# Patient Record
Sex: Female | Born: 1945
Health system: Southern US, Community
[De-identification: ages and names within clinical notes are randomized; demographics above are authoritative.]

## PROBLEM LIST (undated history)

## (undated) DIAGNOSIS — K7290 Hepatic failure, unspecified without coma: Secondary | ICD-10-CM

## (undated) DIAGNOSIS — E785 Hyperlipidemia, unspecified: Secondary | ICD-10-CM

## (undated) DIAGNOSIS — M25519 Pain in unspecified shoulder: Secondary | ICD-10-CM

## (undated) DIAGNOSIS — I5032 Chronic diastolic (congestive) heart failure: Secondary | ICD-10-CM

## (undated) DIAGNOSIS — K7460 Unspecified cirrhosis of liver: Secondary | ICD-10-CM

## (undated) DIAGNOSIS — D485 Neoplasm of uncertain behavior of skin: Secondary | ICD-10-CM

## (undated) DIAGNOSIS — I10 Essential (primary) hypertension: Secondary | ICD-10-CM

## (undated) DIAGNOSIS — N3020 Other chronic cystitis without hematuria: Secondary | ICD-10-CM

## (undated) DIAGNOSIS — R040 Epistaxis: Secondary | ICD-10-CM

## (undated) DIAGNOSIS — K219 Gastro-esophageal reflux disease without esophagitis: Secondary | ICD-10-CM

## (undated) DIAGNOSIS — R600 Localized edema: Secondary | ICD-10-CM

## (undated) DIAGNOSIS — R252 Cramp and spasm: Secondary | ICD-10-CM

## (undated) DIAGNOSIS — Z86010 Personal history of colonic polyps: Secondary | ICD-10-CM

## (undated) DIAGNOSIS — Z974 Presence of external hearing-aid: Secondary | ICD-10-CM

## (undated) DIAGNOSIS — R0600 Dyspnea, unspecified: Secondary | ICD-10-CM

## (undated) DIAGNOSIS — I480 Paroxysmal atrial fibrillation: Secondary | ICD-10-CM

## (undated) DIAGNOSIS — M1990 Unspecified osteoarthritis, unspecified site: Secondary | ICD-10-CM

## (undated) DIAGNOSIS — M545 Low back pain, unspecified: Secondary | ICD-10-CM

## (undated) DIAGNOSIS — K7682 Hepatic encephalopathy: Secondary | ICD-10-CM

## (undated) DIAGNOSIS — I2510 Atherosclerotic heart disease of native coronary artery without angina pectoris: Secondary | ICD-10-CM

## (undated) DIAGNOSIS — Z87442 Personal history of urinary calculi: Secondary | ICD-10-CM

## (undated) DIAGNOSIS — T7840XA Allergy, unspecified, initial encounter: Secondary | ICD-10-CM

## (undated) DIAGNOSIS — R011 Cardiac murmur, unspecified: Secondary | ICD-10-CM

## (undated) DIAGNOSIS — E119 Type 2 diabetes mellitus without complications: Secondary | ICD-10-CM

## (undated) DIAGNOSIS — N181 Chronic kidney disease, stage 1: Secondary | ICD-10-CM

## (undated) DIAGNOSIS — H269 Unspecified cataract: Secondary | ICD-10-CM

## (undated) DIAGNOSIS — I509 Heart failure, unspecified: Secondary | ICD-10-CM

## (undated) DIAGNOSIS — Z794 Long term (current) use of insulin: Secondary | ICD-10-CM

## (undated) DIAGNOSIS — Z973 Presence of spectacles and contact lenses: Secondary | ICD-10-CM

## (undated) DIAGNOSIS — M5030 Other cervical disc degeneration, unspecified cervical region: Secondary | ICD-10-CM

## (undated) DIAGNOSIS — E8940 Asymptomatic postprocedural ovarian failure: Secondary | ICD-10-CM

## (undated) DIAGNOSIS — R7402 Elevation of levels of lactic acid dehydrogenase (LDH): Secondary | ICD-10-CM

## (undated) DIAGNOSIS — I8500 Esophageal varices without bleeding: Secondary | ICD-10-CM

## (undated) DIAGNOSIS — R740 Nonspecific elevation of levels of transaminase and lactic acid dehydrogenase [LDH]: Secondary | ICD-10-CM

## (undated) DIAGNOSIS — D696 Thrombocytopenia, unspecified: Secondary | ICD-10-CM

## (undated) DIAGNOSIS — I219 Acute myocardial infarction, unspecified: Secondary | ICD-10-CM

## (undated) DIAGNOSIS — N3941 Urge incontinence: Secondary | ICD-10-CM

## (undated) DIAGNOSIS — Z860101 Personal history of adenomatous and serrated colon polyps: Secondary | ICD-10-CM

## (undated) DIAGNOSIS — I81 Portal vein thrombosis: Secondary | ICD-10-CM

## (undated) DIAGNOSIS — R202 Paresthesia of skin: Secondary | ICD-10-CM

## (undated) DIAGNOSIS — R300 Dysuria: Secondary | ICD-10-CM

## (undated) DIAGNOSIS — K760 Fatty (change of) liver, not elsewhere classified: Secondary | ICD-10-CM

## (undated) DIAGNOSIS — M7062 Trochanteric bursitis, left hip: Secondary | ICD-10-CM

## (undated) DIAGNOSIS — I4891 Unspecified atrial fibrillation: Secondary | ICD-10-CM

## (undated) DIAGNOSIS — H9190 Unspecified hearing loss, unspecified ear: Secondary | ICD-10-CM

## (undated) DIAGNOSIS — R1310 Dysphagia, unspecified: Secondary | ICD-10-CM

## (undated) DIAGNOSIS — R7401 Elevation of levels of lactic acid dehydrogenase (LDH): Secondary | ICD-10-CM

## (undated) HISTORY — DX: Asymptomatic postprocedural ovarian failure: E89.40

## (undated) HISTORY — DX: Elevation of levels of lactic acid dehydrogenase (LDH): R74.02

## (undated) HISTORY — DX: Paresthesia of skin: R20.2

## (undated) HISTORY — DX: Trochanteric bursitis, left hip: M70.62

## (undated) HISTORY — DX: Low back pain, unspecified: M54.50

## (undated) HISTORY — PX: UPPER GASTROINTESTINAL ENDOSCOPY: SHX188

## (undated) HISTORY — DX: Allergy, unspecified, initial encounter: T78.40XA

## (undated) HISTORY — DX: Dysuria: R30.0

## (undated) HISTORY — DX: Unspecified osteoarthritis, unspecified site: M19.90

## (undated) HISTORY — DX: Chronic kidney disease, stage 1: N18.1

## (undated) HISTORY — DX: Gastro-esophageal reflux disease without esophagitis: K21.9

## (undated) HISTORY — DX: Nonspecific elevation of levels of transaminase and lactic acid dehydrogenase (ldh): R74.0

## (undated) HISTORY — PX: BREAST BIOPSY: SHX20

## (undated) HISTORY — DX: Dysphagia, unspecified: R13.10

## (undated) HISTORY — DX: Hyperlipidemia, unspecified: E78.5

## (undated) HISTORY — DX: Neoplasm of uncertain behavior of skin: D48.5

## (undated) HISTORY — DX: Unspecified cataract: H26.9

## (undated) HISTORY — DX: Cramp and spasm: R25.2

## (undated) HISTORY — DX: Elevation of levels of lactic acid dehydrogenase (LDH): R74.01

## (undated) HISTORY — DX: Pain in unspecified shoulder: M25.519

## (undated) HISTORY — DX: Low back pain: M54.5

## (undated) HISTORY — DX: Heart failure, unspecified: I50.9

## (undated) HISTORY — DX: Epistaxis: R04.0

## (undated) HISTORY — DX: Essential (primary) hypertension: I10

## (undated) HISTORY — DX: Unspecified cirrhosis of liver: K74.60

## (undated) HISTORY — DX: Type 2 diabetes mellitus without complications: E11.9

---

## 1980-05-09 HISTORY — PX: TUBAL LIGATION: SHX77

## 1995-05-10 HISTORY — PX: VAGINAL HYSTERECTOMY: SUR661

## 1997-06-26 ENCOUNTER — Other Ambulatory Visit: Admission: RE | Admit: 1997-06-26 | Discharge: 1997-06-26 | Payer: Self-pay | Admitting: *Deleted

## 1997-08-11 ENCOUNTER — Ambulatory Visit (HOSPITAL_COMMUNITY): Admission: RE | Admit: 1997-08-11 | Discharge: 1997-08-11 | Payer: Self-pay | Admitting: *Deleted

## 1998-06-26 ENCOUNTER — Other Ambulatory Visit: Admission: RE | Admit: 1998-06-26 | Discharge: 1998-06-26 | Payer: Self-pay | Admitting: *Deleted

## 1998-08-04 ENCOUNTER — Ambulatory Visit (HOSPITAL_COMMUNITY): Admission: RE | Admit: 1998-08-04 | Discharge: 1998-08-04 | Payer: Self-pay | Admitting: *Deleted

## 1998-08-04 ENCOUNTER — Encounter: Payer: Self-pay | Admitting: *Deleted

## 1999-03-19 ENCOUNTER — Ambulatory Visit (HOSPITAL_COMMUNITY): Admission: RE | Admit: 1999-03-19 | Discharge: 1999-03-19 | Payer: Self-pay | Admitting: *Deleted

## 1999-03-19 ENCOUNTER — Encounter: Payer: Self-pay | Admitting: *Deleted

## 1999-07-06 ENCOUNTER — Other Ambulatory Visit: Admission: RE | Admit: 1999-07-06 | Discharge: 1999-07-06 | Payer: Self-pay | Admitting: *Deleted

## 2000-08-21 ENCOUNTER — Encounter: Payer: Self-pay | Admitting: *Deleted

## 2000-08-21 ENCOUNTER — Ambulatory Visit (HOSPITAL_COMMUNITY): Admission: RE | Admit: 2000-08-21 | Discharge: 2000-08-21 | Payer: Self-pay | Admitting: *Deleted

## 2001-04-13 ENCOUNTER — Encounter (INDEPENDENT_AMBULATORY_CARE_PROVIDER_SITE_OTHER): Payer: Self-pay | Admitting: Specialist

## 2001-04-13 ENCOUNTER — Ambulatory Visit (HOSPITAL_COMMUNITY): Admission: RE | Admit: 2001-04-13 | Discharge: 2001-04-13 | Payer: Self-pay | Admitting: *Deleted

## 2001-04-13 ENCOUNTER — Encounter (INDEPENDENT_AMBULATORY_CARE_PROVIDER_SITE_OTHER): Payer: Self-pay | Admitting: *Deleted

## 2001-09-04 ENCOUNTER — Encounter: Payer: Self-pay | Admitting: *Deleted

## 2001-09-04 ENCOUNTER — Ambulatory Visit (HOSPITAL_COMMUNITY): Admission: RE | Admit: 2001-09-04 | Discharge: 2001-09-04 | Payer: Self-pay | Admitting: *Deleted

## 2002-08-08 ENCOUNTER — Other Ambulatory Visit: Admission: RE | Admit: 2002-08-08 | Discharge: 2002-08-08 | Payer: Self-pay | Admitting: *Deleted

## 2003-11-14 ENCOUNTER — Ambulatory Visit (HOSPITAL_COMMUNITY): Admission: RE | Admit: 2003-11-14 | Discharge: 2003-11-14 | Payer: Self-pay | Admitting: Internal Medicine

## 2004-06-18 ENCOUNTER — Encounter (INDEPENDENT_AMBULATORY_CARE_PROVIDER_SITE_OTHER): Payer: Self-pay | Admitting: *Deleted

## 2004-06-18 ENCOUNTER — Encounter (INDEPENDENT_AMBULATORY_CARE_PROVIDER_SITE_OTHER): Payer: Self-pay | Admitting: Specialist

## 2004-06-18 ENCOUNTER — Ambulatory Visit (HOSPITAL_COMMUNITY): Admission: RE | Admit: 2004-06-18 | Discharge: 2004-06-18 | Payer: Self-pay | Admitting: *Deleted

## 2005-11-06 ENCOUNTER — Emergency Department (HOSPITAL_COMMUNITY): Admission: EM | Admit: 2005-11-06 | Discharge: 2005-11-06 | Payer: Self-pay | Admitting: Family Medicine

## 2006-08-14 ENCOUNTER — Encounter: Admission: RE | Admit: 2006-08-14 | Discharge: 2006-08-14 | Payer: Self-pay | Admitting: Sports Medicine

## 2007-07-14 ENCOUNTER — Emergency Department (HOSPITAL_COMMUNITY): Admission: EM | Admit: 2007-07-14 | Discharge: 2007-07-14 | Payer: Self-pay | Admitting: Emergency Medicine

## 2007-09-13 ENCOUNTER — Ambulatory Visit (HOSPITAL_COMMUNITY): Admission: RE | Admit: 2007-09-13 | Discharge: 2007-09-13 | Payer: Self-pay | Admitting: *Deleted

## 2007-09-13 ENCOUNTER — Encounter (INDEPENDENT_AMBULATORY_CARE_PROVIDER_SITE_OTHER): Payer: Self-pay | Admitting: *Deleted

## 2008-05-09 HISTORY — PX: EXTRACORPOREAL SHOCK WAVE LITHOTRIPSY: SHX1557

## 2008-05-10 ENCOUNTER — Emergency Department (HOSPITAL_COMMUNITY): Admission: EM | Admit: 2008-05-10 | Discharge: 2008-05-10 | Payer: Self-pay | Admitting: Emergency Medicine

## 2008-05-29 ENCOUNTER — Ambulatory Visit (HOSPITAL_COMMUNITY): Admission: RE | Admit: 2008-05-29 | Discharge: 2008-05-29 | Payer: Self-pay | Admitting: Urology

## 2009-07-28 ENCOUNTER — Encounter (INDEPENDENT_AMBULATORY_CARE_PROVIDER_SITE_OTHER): Payer: Self-pay | Admitting: *Deleted

## 2009-09-01 ENCOUNTER — Ambulatory Visit: Payer: Self-pay | Admitting: Gastroenterology

## 2010-05-09 HISTORY — PX: COLONOSCOPY: SHX174

## 2010-06-08 NOTE — Procedures (Signed)
Summary: Colon   Colonoscopy  Procedure date:  04/13/2001  Findings:      Location:  [facility]  Patient:    [name], [name] Visit Number: [id number] MRN: [id number]          Service Type: END Location: ENDO Attending Physician:  Jim Desanctis Dictated by:   Jim Desanctis, M.D. Proc. Date: 04/13/01 Admit Date:  04/13/2001 Discharge Date: 04/13/2001                             Procedure Report  PROCEDURE: Colonoscopy.  INDICATIONS: Colon polyps.  ANESTHESIA: Demerol 20 mg, Versed 2 mg.  DESCRIPTION OF PROCEDURE: With the patient mildly sedated in the left lateral decubitus position, the Olympus videoscopic colonoscope was inserted into the rectum, passed under direct vision to the cecum identified by ileocecal valve and appendiceal orifice, both of which were photographed. From this point the coloscope was slowly withdrawn taking circumferential views of the entire colonic mucosa stopping at approximately 30 cm from the anal verge at which point a small polyp was seen, photographed and removed using hot biopsy forceps technique, setting of 20/20 ______ current. The endoscope was withdrawn through the rectum which appeared normal in direct and retroflexed vies. The endoscope was withdrawn. The patients vital signs and pulse oximeter remained stable. The patient tolerated the procedure well with no apparent complications.  FINDINGS: Small polyp at 30 cm, otherwise an unremarkable examination.  PLAN: Await biopsy report. The patient will call me for results and followup with me as an outpatient. See endoscopy note for further details. Dictated by:   Jim Desanctis, M.D. Attending Physician:  Jim Desanctis DD:  04/13/01 TD:  04/14/01 Job: [id number]

## 2010-06-08 NOTE — Assessment & Plan Note (Signed)
History of Present Illness Visit Type: Initial Consult Primary GI MD: Owens Loffler MD Primary Provider: Jeanmarie Hubert, MD Requesting Provider: Jeanmarie Hubert, MD Chief Complaint: Colon screening /hx of polyps History of Present Illness:      very pleasant 65 year old womanwho was a former patient of Dr. Jim Desanctis.  She has had several colonoscopies over the past 10-12 years. In 2006 Dr. Lajoyce Corners documented "two small polyps" were removed. These were adenomatous. He repeat colonoscopy 3 years later in May of 2009. During that colonoscopy you also removed to colon polyps. He did not get a size to either of them. One was removed with forceps and the other with cautery, snare. These were also adenomatous.  I see an in office note 2 months later that he recommended she have a repeat colonoscopy at 3 year interval. I do not see documentation of prep quality on his 2009 colonoscopy.  She has had no troubles with bowels.  No bleeding, no constipation.             Current Medications (verified): 1)  Vitamin D 400 Unit Caps (Cholecalciferol) .... 2000 Units Per Day 2)  Glucotrol Xl 10 Mg Xr24h-Tab (Glipizide) .[name] Kitchen.. 1 By Mouth Once Daily 3)  Onglyza 5 Mg Tabs (Saxagliptin Hcl) .[name] Kitchen.. 1 By Mouth Once Daily 4)  Mobic 7.5 Mg Tabs (Meloxicam) .[name] Kitchen.. 1 By Mouth Once Daily 5)  Omeprazole 20 Mg Cpdr (Omeprazole) .[name] Kitchen.. 1 By Mouth Once Daily 6)  Zinc Gluconate 50 Mg Tabs (Zinc Gluconate) .... 1/4 Tab Daily 7)  Trimethoprim 100 Mg Tabs (Trimethoprim) .[name] Kitchen.. 1 By Mouth Once Daily 8)  Metformin Hcl 1000 Mg Tabs (Metformin Hcl) .[name] Kitchen.. 1 By Mouth Two Times A Day 9)  Multivitamins  Tabs (Multiple Vitamin) .[name] Kitchen.. 1 By Mouth Once Daily 10)  Calcium-Carb 600 + D 600-125 Mg-Unit Tabs (Calcium-Vitamin D) .[name] Kitchen.. 1 By Mouth Two Times A Day 11)  Allergy Relief 4 Mg Tabs (Chlorpheniramine Maleate) .... Once Daily  Allergies (verified): No Known Drug Allergies  Past History:  Past Medical  History: Diabetes dysphagia Hypertension Hyperlipidemia Arthritis GERD Chronic kidney disease post ablative ovarian failure adenomatous colon polyps  Past Surgical History: partial hysterectomy 1998  Family History: no colon cancer  Social History: she is married, she has 6 children, she works as a housewife, she does not smoke cigarettes or drink alcohol.  Review of Systems       Pertinent positive and negative review of systems were noted in the above HPI and GI specific review of systems.  All other review of systems was otherwise negative.   Vital Signs:  Patient profile:   65 year old female Height:      63 inches Weight:      169.38 pounds BMI:     30.11 Pulse rate:   84 / minute Pulse rhythm:   regular BP sitting:   122 / 74  (left arm) Cuff size:   regular  Vitals Entered By: June McMurray Mora Deborra Medina) (September 01, 2009 1:30 PM)  Physical Exam  Additional Exam:  Constitutional: generally well appearing Psychiatric: alert and oriented times 3 Eyes: extraocular movements intact Mouth: oropharynx moist, no lesions Neck: supple, no lymphadenopathy Cardiovascular: heart regular rate and rythm Lungs: CTA bilaterally Abdomen: soft, non-tender, non-distended, no obvious ascites, no peritoneal signs, normal bowel sounds Extremities: no lower extremity edema bilaterally Skin: no lesions on visible extremities    Impression & Recommendations:  Problem # 1:  Personal history of precancerous colon polyps her most  recent colonoscopy was 2 years ago and at that time her gastroenterologist recommended she have a nether colonoscopy at 3 year interval. Current colorectal screening and polyp surveillance guidelines recommend colonoscopy usually 5 years following resection of 2 small, subcentimeter polyps. The size of the polyps were not documented on his report and neither was the prep quality and so I think to be safe we should simply follow his recommendations and repeat  colonoscopy at 3 year interval.  we will therefore set her up for repeat colonoscopy may, 2012.  Patient Instructions: 1)  Recall colonoscopy for May 2012. 2)  A copy of this information will be sent to Dr. Jeanmarie Hubert. 3)  The medication list was reviewed and reconciled.  All changed / newly prescribed medications were explained.  A complete medication list was provided to the patient / caregiver.  Appended Document: recall colon    Clinical Lists Changes  Observations: Added new observation of COLONNXTDUE: 09/2010 (09/01/2009 13:50)

## 2010-06-08 NOTE — Procedures (Signed)
Summary: EGD   EGD  Procedure date:  04/13/2001  Findings:      Location: [facility]                          [employer]  Patient:    [name], [name] Visit Number: [id number] MRN: [id number]          Service Type: END Location: ENDO Attending Physician:  Jim Desanctis Dictated by:   Jim Desanctis, M.D. Proc. Date: 04/13/01 Admit Date:  04/13/2001 Discharge Date: 04/13/2001                             Operative Report  PROCEDURE:  Upper endoscopy with dilation and biopsy.  INDICATIONS:  Dysphagia.  ANESTHESIA:  Demerol 50 mg, Versed 7 mg IV in divided doses.  DESCRIPTION OF PROCEDURE:  With the patient mildly sedated in the left lateral decubitus position, the Olympus videoscopic endoscope was inserted into the mouth and passed under direct vision through the esophagus. There appeared to be a stricture which was photographed.  We passed this easily into the stomach.  No Barretts esophagus tissue was seen.  There appeared to be food in the fundus.  We advanced to the antrum.  The duodenal bulb and second portion of the duodenum were photographed.  From this point, the endoscope was slowly withdrawn taking circumferential views of the entire duodenal mucosa until the endoscope was then pulled back into the stomach and placed in retroflexion to view the stomach from below and a hiatal hernia was seen and photographed.  The endoscope was then straightened and withdrawn after taking biopsies of the body of the stomach where there were some changes possibly of gastritis photographed and biopsied and after a guidewire was passed. Subsequently, a 16 Savary dilator was passed with some resistance.  I therefore elected to remove the guidewire with just one dilation.  The endoscope was reinserted.  No bleeding was seen.  No other abnormalities were noted.  The endoscope was withdrawn taking circumferential views of the remaining esophageal mucosa.  The patients  vital signs and pulse oximetry remained stable.  The patient tolerated the procedure well without apparent complications.  FINDINGS:  Stricture of the esophagus dilated to 16 Savary dilation.  There appears to be food in the stomach and some changes of gastritis in the body, biopsied.  Await biopsy.  The patient will call me for results and follow up with me as an outpatient.  Await clinical response.  Proceed to colonoscopy as planned. Dictated by:   Jim Desanctis, M.D. Attending Physician:  Jim Desanctis DD:  04/13/01 TD:  04/14/01 Job: [id number]

## 2010-06-08 NOTE — Letter (Signed)
Summary: New Patient letter  [facility]  [place], [place] [address]   Phone: [phone]  Fax: [phone]       07/28/2009 MRN: [id number]  [place], [place]  [address]  Dear Ms. Durene Fruits,  Welcome to the Gastroenterology Division at [facility].    You are scheduled to see Dr.  [name] on 09-01-09 at 1:30p.m. on the 3rd floor at [facility], [place] [employer].  We ask that you try to arrive at our office 15 minutes prior to your appointment time to allow for check-in.  We would like you to complete the enclosed self-administered evaluation form prior to your visit and bring it with you on the day of your appointment.  We will review it with you.  Also, please bring a complete list of all your medications or, if you prefer, bring the medication bottles and we will list them.  Please bring your insurance card so that we may make a copy of it.  If your insurance requires a referral to see a specialist, please bring your referral form from your primary care physician.  Co-payments are due at the time of your visit and may be paid by cash, check or credit card.     Your office visit will consist of a consult with your physician (includes a physical exam), any laboratory testing he/she may order, scheduling of any necessary diagnostic testing (e.g. x-ray, ultrasound, CT-scan), and scheduling of a procedure (e.g. Endoscopy, Colonoscopy) if required.  Please allow enough time on your schedule to allow for any/all of these possibilities.    If you cannot keep your appointment, please call [phone] to cancel or reschedule prior to your appointment date.  This allows [place] the opportunity to schedule an appointment for another patient in need of care.  If you do not cancel or reschedule by 5 p.m. the business day prior to your appointment date, you will be charged a $50.00 late cancellation/no-show fee.    Thank you for choosing  [facility] for your medical needs.  We appreciate the opportunity to care for you.  Please visit [place] at our website  to learn more about our practice.                     Sincerely,                                                             The Gastroenterology Division

## 2010-06-08 NOTE — Procedures (Signed)
Summary: Colon   Colonoscopy  Procedure date:  09/13/2007  Findings:      Location:  [facility].   NAME:  [name], [name]             ACCOUNT NO.:  [id number]      MEDICAL RECORD NO.:  [id number]          PATIENT TYPE:  AMB      LOCATION:  ENDO                         FACILITY:  [facility]      PHYSICIAN:  Waverly Ferrari, M.D.    DATE OF BIRTH:  1946/01/09      DATE OF PROCEDURE:   DATE OF DISCHARGE:                                  OPERATIVE REPORT      PROCEDURE:  Colonoscopy.      INDICATIONS:  Colon polyps.      ANESTHESIA:  Demerol 60 mg, Versed 7.5 mg.      PROCEDURE:  With the patient mildly sedated in the left lateral   decubitus position, the Pentax videoscopic colonoscope was inserted in   the rectum and passed under direct vision to the cecum identified by the   ileocecal valve and appendiceal orifice both of which were photographed.   From this point, the colonoscope was slowly withdrawn taking   circumferential views of the colonic mucosa stopping in the ascending   colon were two adjacent polyps were seen, the first was removed using   snare cautery technique setting of 20/150 blended current and was   retrieved through the suction into a tissue trap.  The second polyp was   removed using hot biopsy forceps technique same setting.  From this   point, the colonoscope was slowly withdrawn taking circumferential views   of the colonic mucosa stopping only then in the rectum which appeared   normal on direct and showed hemorrhoids on retroflexed view. The   endoscope was straightened and withdrawn. The patient's vital signs and   pulse oximeter remained stable.  The patient tolerated the procedure   well without apparent complications.      FINDINGS:  Internal hemorrhoids.  Polyps as described above.  An   occasional diverticulum seen in the sigmoid colon.      PLAN:  Await biopsy report.  The patient will call me for results and   follow-up with me as  needed as an outpatient.                  ______________________________   Waverly Ferrari, M.D.            GMO/MEDQ  D:  09/13/2007  T:  09/13/2007  Job:  [id number]      cc:   Viviann Spare. Nyoka Cowden, M.D.   Fax: [phone]

## 2010-06-08 NOTE — Procedures (Signed)
Summary: Colon   Colonoscopy  Procedure date:  06/18/2004  Findings:      Location:  [facility].   NAME:  [name], [name]             ACCOUNT NO.:  [id number]   MEDICAL RECORD NO.:  [id number]          PATIENT TYPE:  AMB   LOCATION:  ENDO                         FACILITY:  [facility]   PHYSICIAN:  Waverly Ferrari, M.D.    DATE OF BIRTH:  07-08-1945   DATE OF PROCEDURE:  06/18/2004  DATE OF DISCHARGE:                                 OPERATIVE REPORT   PROCEDURE:  Colonoscopy with biopsy.   INDICATIONS:  Colon polyps.   ANESTHESIA:  Demerol 70, Versed 8 mg.   PROCEDURE:  With the patient mildly sedated in the left lateral decubitus  position, the Olympus videoscopic colonoscope was inserted into the rectum  and passed under direct vision to the cecum identified by the base of the  cecum and the ileocecal valve both of which were photographed. From this  point, the colonoscope was slowly withdrawn taking circumferential views of  the colonic mucosa as we withdrew all the way to the rectum, stopping only  in the descending colon were two small polyps were seen, photographed and  removed using hot biopsy forceps technique on a setting of 20:20 blended  current. The endoscope was then as stated withdrawn. The patient's vital  signs and pulse oximeter remained stable. The patient tolerated procedure  well without apparent complication.   FINDINGS:  Two small polyps of descending colon otherwise an unremarkable  colonoscopic examination.   PLAN:  Await biopsy report. The patient will call me for results and follow-  up with me as an outpatient.      GMO/MEDQ  D:  06/18/2004  T:  06/18/2004  Job:  [id number]

## 2010-07-19 ENCOUNTER — Other Ambulatory Visit: Payer: Self-pay | Admitting: Internal Medicine

## 2010-07-19 DIAGNOSIS — Z1231 Encounter for screening mammogram for malignant neoplasm of breast: Secondary | ICD-10-CM

## 2010-07-28 ENCOUNTER — Ambulatory Visit (HOSPITAL_COMMUNITY)
Admission: RE | Admit: 2010-07-28 | Discharge: 2010-07-28 | Disposition: A | Discharge status: Home / Self Care | Payer: 59 | Source: Ambulatory Visit | Attending: Internal Medicine | Admitting: Internal Medicine

## 2010-07-28 DIAGNOSIS — Z1231 Encounter for screening mammogram for malignant neoplasm of breast: Secondary | ICD-10-CM | POA: Insufficient documentation

## 2010-08-23 LAB — URINALYSIS, ROUTINE W REFLEX MICROSCOPIC
Glucose, UA: 500 mg/dL — AB
Ketones, ur: NEGATIVE mg/dL
Nitrite: NEGATIVE
Specific Gravity, Urine: 1.021 (ref 1.005–1.030)
pH: 7 (ref 5.0–8.0)

## 2010-08-23 LAB — GLUCOSE, CAPILLARY
Glucose-Capillary: 239 mg/dL — ABNORMAL HIGH (ref 70–99)
Glucose-Capillary: 163 mg/dL — ABNORMAL HIGH (ref 70–99)

## 2010-08-23 LAB — BASIC METABOLIC PANEL
BUN: 18 mg/dL (ref 6–23)
Calcium: 9.5 mg/dL (ref 8.4–10.5)
Creatinine, Ser: 0.78 mg/dL (ref 0.4–1.2)
GFR calc non Af Amer: >60 mL/min (ref >60)
Glucose, Bld: 245 mg/dL — ABNORMAL HIGH (ref 70–99)
Potassium: 3.6 mEq/L (ref 3.5–5.1)

## 2010-08-23 LAB — URINE MICROSCOPIC-ADD ON

## 2010-09-21 NOTE — Op Note (Signed)
NAMETONDALAYA, PERREN NO.:  [id number]   MEDICAL RECORD NO.:  [id number]          PATIENT TYPE:  AMB   LOCATION:  ENDO                         FACILITY:  [facility]   PHYSICIAN:  Waverly Ferrari, M.D.    DATE OF BIRTH:  Sep 20, 1945   DATE OF PROCEDURE:  DATE OF DISCHARGE:                               OPERATIVE REPORT   PROCEDURE:  Colonoscopy.   INDICATIONS:  Colon polyps.   ANESTHESIA:  Demerol 60 mg, Versed 7.5 mg.   PROCEDURE:  With the patient mildly sedated in the left lateral  decubitus position, the Pentax videoscopic colonoscope was inserted in  the rectum and passed under direct vision to the cecum identified by the  ileocecal valve and appendiceal orifice both of which were photographed.  From this point, the colonoscope was slowly withdrawn taking  circumferential views of the colonic mucosa stopping in the ascending  colon were two adjacent polyps were seen, the first was removed using  snare cautery technique setting of 20/150 blended current and was  retrieved through the suction into a tissue trap.  The second polyp was  removed using hot biopsy forceps technique same setting.  From this  point, the colonoscope was slowly withdrawn taking circumferential views  of the colonic mucosa stopping only then in the rectum which appeared  normal on direct and showed hemorrhoids on retroflexed view. The  endoscope was straightened and withdrawn. The patient's vital signs and  pulse oximeter remained stable.  The patient tolerated the procedure  well without apparent complications.   FINDINGS:  Internal hemorrhoids.  Polyps as described above.  An  occasional diverticulum seen in the sigmoid colon.   PLAN:  Await biopsy report.  The patient will call me for results and  follow-up with me as needed as an outpatient.           ______________________________  Waverly Ferrari, M.D.     GMO/MEDQ  D:  09/13/2007  T:  09/13/2007  Job:  [id number]   cc:   Viviann Spare. Nyoka Cowden, M.D.  Fax: [phone]

## 2010-09-24 NOTE — Procedures (Signed)
[facility]  Patient:    [name], [name] Visit Number: [id number] MRN: [id number]          Service Type: END Location: ENDO Attending Physician:  Jim Desanctis Dictated by:   Jim Desanctis, M.D. Proc. Date: 04/13/01 Admit Date:  04/13/2001 Discharge Date: 04/13/2001                             Procedure Report  PROCEDURE: Colonoscopy.  INDICATIONS: Colon polyps.  ANESTHESIA: Demerol 20 mg, Versed 2 mg.  DESCRIPTION OF PROCEDURE: With the patient mildly sedated in the left lateral decubitus position, the Olympus videoscopic colonoscope was inserted into the rectum, passed under direct vision to the cecum identified by ileocecal valve and appendiceal orifice, both of which were photographed. From this point the coloscope was slowly withdrawn taking circumferential views of the entire colonic mucosa stopping at approximately 30 cm from the anal verge at which point a small polyp was seen, photographed and removed using hot biopsy forceps technique, setting of 20/20 ______ current. The endoscope was withdrawn through the rectum which appeared normal in direct and retroflexed vies. The endoscope was withdrawn. The patients vital signs and pulse oximeter remained stable. The patient tolerated the procedure well with no apparent complications.  FINDINGS: Small polyp at 30 cm, otherwise an unremarkable examination.  PLAN: Await biopsy report. The patient will call me for results and followup with me as an outpatient. See endoscopy note for further details. Dictated by:   Jim Desanctis, M.D. Attending Physician:  Jim Desanctis DD:  04/13/01 TD:  04/14/01 Job: [id number]

## 2010-09-24 NOTE — Op Note (Signed)
[name]  Patient:    [name], [name] Visit Number: [id number] MRN: [id number]          Service Type: END Location: ENDO Attending Physician:  Jim Desanctis Dictated by:   Jim Desanctis, M.D. Proc. Date: 04/13/01 Admit Date:  04/13/2001 Discharge Date: 04/13/2001                             Operative Report  PROCEDURE:  Upper endoscopy with dilation and biopsy.  INDICATIONS:  Dysphagia.  ANESTHESIA:  Demerol 50 mg, Versed 7 mg IV in divided doses.  DESCRIPTION OF PROCEDURE:  With the patient mildly sedated in the left lateral decubitus position, the Olympus videoscopic endoscope was inserted into the mouth and passed under direct vision through the esophagus. There appeared to be a stricture which was photographed.  We passed this easily into the stomach.  No Barretts esophagus tissue was seen.  There appeared to be food in the fundus.  We advanced to the antrum.  The duodenal bulb and second portion of the duodenum were photographed.  From this point, the endoscope was slowly withdrawn taking circumferential views of the entire duodenal mucosa until the endoscope was then pulled back into the stomach and placed in retroflexion to view the stomach from below and a hiatal hernia was seen and photographed.  The endoscope was then straightened and withdrawn after taking biopsies of the body of the stomach where there were some changes possibly of gastritis photographed and biopsied and after a guidewire was passed. Subsequently, a 16 Savary dilator was passed with some resistance.  I therefore elected to remove the guidewire with just one dilation.  The endoscope was reinserted.  No bleeding was seen.  No other abnormalities were noted.  The endoscope was withdrawn taking circumferential views of the remaining esophageal mucosa.  The patients vital signs and pulse oximetry remained stable.  The patient tolerated the procedure well without  apparent complications.  FINDINGS:  Stricture of the esophagus dilated to 16 Savary dilation.  There appears to be food in the stomach and some changes of gastritis in the body, biopsied.  Await biopsy.  The patient will call me for results and follow up with me as an outpatient.  Await clinical response.  Proceed to colonoscopy as planned. Dictated by:   Jim Desanctis, M.D. Attending Physician:  Jim Desanctis DD:  04/13/01 TD:  04/14/01 Job: [id number]

## 2010-09-24 NOTE — Op Note (Signed)
NAMEUNDREA, [name] NO.:  [id number]   MEDICAL RECORD NO.:  [id number]          PATIENT TYPE:  AMB   LOCATION:  ENDO                         FACILITY[facility]  [facility]   PHYSICIAN:  Waverly Ferrari, M.D.    DATE OF BIRTH:  1945-09-17   DATE OF PROCEDURE:  06/18/2004  DATE OF DISCHARGE:                                 OPERATIVE REPORT   PROCEDURE:  Colonoscopy with biopsy.   INDICATIONS:  Colon polyps.   ANESTHESIA:  Demerol 70, Versed 8 mg.   PROCEDURE:  With the patient mildly sedated in the left lateral decubitus  position, the Olympus videoscopic colonoscope was inserted into the rectum  and passed under direct vision to the cecum identified by the base of the  cecum and the ileocecal valve both of which were photographed. From this  point, the colonoscope was slowly withdrawn taking circumferential views of  the colonic mucosa as we withdrew all the way to the rectum, stopping only  in the descending colon were two small polyps were seen, photographed and  removed using hot biopsy forceps technique on a setting of 20:20 blended  current. The endoscope was then as stated withdrawn. The patient's vital  signs and pulse oximeter remained stable. The patient tolerated procedure  well without apparent complication.   FINDINGS:  Two small polyps of descending colon otherwise an unremarkable  colonoscopic examination.   PLAN:  Await biopsy report. The patient will call me for results and follow-  up with me as an outpatient.      GMO/MEDQ  D:  06/18/2004  T:  06/18/2004  Job:  [id number]

## 2010-11-03 ENCOUNTER — Encounter: Payer: Self-pay | Admitting: Gastroenterology

## 2010-11-16 ENCOUNTER — Encounter: Payer: Self-pay | Admitting: Gastroenterology

## 2010-12-08 ENCOUNTER — Ambulatory Visit (AMBULATORY_SURGERY_CENTER): Payer: 59 | Admitting: *Deleted

## 2010-12-08 ENCOUNTER — Encounter: Payer: Self-pay | Admitting: Gastroenterology

## 2010-12-08 VITALS — Ht 64.5 in | Wt 160.0 lb

## 2010-12-08 DIAGNOSIS — Z1211 Encounter for screening for malignant neoplasm of colon: Secondary | ICD-10-CM

## 2010-12-08 MED ORDER — PEG-KCL-NACL-NASULF-NA ASC-C 100 G PO SOLR: ORAL | Status: DC

## 2010-12-22 ENCOUNTER — Encounter: Payer: Self-pay | Admitting: Gastroenterology

## 2010-12-22 ENCOUNTER — Ambulatory Visit: Payer: 59 | Admitting: Gastroenterology

## 2010-12-22 VITALS — Temp 97.1°F | Ht 64.0 in | Wt 160.0 lb

## 2010-12-22 DIAGNOSIS — D126 Benign neoplasm of colon, unspecified: Secondary | ICD-10-CM

## 2010-12-22 DIAGNOSIS — Z1211 Encounter for screening for malignant neoplasm of colon: Secondary | ICD-10-CM

## 2010-12-22 MED ORDER — SODIUM CHLORIDE 0.9 % IV SOLN: 500.0000 mL | INTRAVENOUS | Status: DC

## 2010-12-22 NOTE — Patient Instructions (Signed)
DISCHARGE INSTRUCTIONS GIVEN WITH VERBAL UNDERSTANDING. HANDOUT ON POLYPS GIVEN. RESUME PREVIOUS MEDICATIONS.

## 2010-12-23 ENCOUNTER — Telehealth: Payer: Self-pay | Admitting: *Deleted

## 2010-12-23 NOTE — Telephone Encounter (Signed)

## 2011-01-31 LAB — URINALYSIS, ROUTINE W REFLEX MICROSCOPIC
Bilirubin Urine: NEGATIVE
Glucose, UA: 250 — AB
Hgb urine dipstick: NEGATIVE
Ketones, ur: NEGATIVE
Nitrite: NEGATIVE
Protein, ur: NEGATIVE
Specific Gravity, Urine: 1.019
Urobilinogen, UA: 1
pH: 5.5

## 2011-01-31 LAB — I-STAT 8, (EC8 V) (CONVERTED LAB)
Acid-Base Excess: 1
BUN: 15
Bicarbonate: 26.4 — ABNORMAL HIGH
Chloride: 104
Glucose, Bld: 214 — ABNORMAL HIGH
HCT: 44
Operator id: 151321
Potassium: 4.3
Sodium: 138
TCO2: 28
pCO2, Ven: 45.7
pH, Ven: 7.369 — ABNORMAL HIGH

## 2011-01-31 LAB — DIFFERENTIAL
Basophils Absolute: 0
Basophils Relative: 0
Eosinophils Absolute: 0.1
Eosinophils Relative: 1
Lymphocytes Relative: 15
Lymphs Abs: 1.4
Monocytes Absolute: 0.6
Monocytes Relative: 7
Neutro Abs: 6.9
Neutrophils Relative %: 77

## 2011-01-31 LAB — CBC
HCT: 39.3
Hemoglobin: 13.6
MCHC: 34.5
MCV: 89.7
Platelets: 158
RBC: 4.38
RDW: 12.8
WBC: 9

## 2011-01-31 LAB — URINE MICROSCOPIC-ADD ON

## 2011-01-31 LAB — POCT I-STAT CREATININE
Creatinine, Ser: 0.8
Operator id: 151321

## 2011-08-12 ENCOUNTER — Other Ambulatory Visit: Payer: Self-pay | Admitting: Internal Medicine

## 2011-08-12 DIAGNOSIS — Z1231 Encounter for screening mammogram for malignant neoplasm of breast: Secondary | ICD-10-CM

## 2011-09-12 ENCOUNTER — Ambulatory Visit (HOSPITAL_COMMUNITY)
Admission: RE | Admit: 2011-09-12 | Discharge: 2011-09-12 | Disposition: A | Discharge status: Home / Self Care | Payer: 59 | Source: Ambulatory Visit | Attending: Internal Medicine | Admitting: Internal Medicine

## 2011-09-12 DIAGNOSIS — Z1231 Encounter for screening mammogram for malignant neoplasm of breast: Secondary | ICD-10-CM | POA: Insufficient documentation

## 2011-12-18 ENCOUNTER — Emergency Department (INDEPENDENT_AMBULATORY_CARE_PROVIDER_SITE_OTHER)
Admission: EM | Admit: 2011-12-18 | Discharge: 2011-12-18 | Disposition: A | Discharge status: Home / Self Care | Payer: Medicare Other | Source: Home / Self Care

## 2011-12-18 ENCOUNTER — Encounter (HOSPITAL_COMMUNITY): Payer: Self-pay

## 2011-12-18 DIAGNOSIS — R300 Dysuria: Secondary | ICD-10-CM

## 2011-12-18 DIAGNOSIS — N390 Urinary tract infection, site not specified: Secondary | ICD-10-CM

## 2011-12-18 LAB — POCT URINALYSIS DIP (DEVICE)
Bilirubin Urine: NEGATIVE
Nitrite: POSITIVE — AB
Protein, ur: 100 mg/dL — AB
pH: 7 (ref 5.0–8.0)

## 2011-12-18 MED ORDER — PHENAZOPYRIDINE HCL 200 MG PO TABS: 200.0000 mg | ORAL_TABLET | Freq: Three times a day (TID) | ORAL | Status: AC

## 2011-12-18 MED ORDER — CIPROFLOXACIN HCL 500 MG PO TABS: 500.0000 mg | ORAL_TABLET | Freq: Two times a day (BID) | ORAL | Status: AC

## 2011-12-18 MED ORDER — PENICILLIN G BENZATHINE 1200000 UNIT/2ML IM SUSP
INTRAMUSCULAR | Status: AC
  Filled 2011-12-18: qty 2

## 2011-12-18 NOTE — ED Provider Notes (Signed)
History     CSN: [id number]  Arrival date & time 12/18/11  1346   None     Chief Complaint  Patient presents with  . Urinary Tract Infection    (Consider location/radiation/quality/duration/timing/severity/associated sxs/prior treatment) The history is provided by the patient.  [name] is a 66 y.o. female who complains of urinary frequency, urgency and dysuria x since yesterday, without flank pain, fever, chills, or abnormal vaginal discharge or bleeding.   Has been taking AZO for symptoms with no relief.  Has history of UTI, not frequent, last UTI 1 years ago.  Reports seen by urologist and prescribed medication to prevent UTI, predisposed to infection after sexual intercourse.  States she did take medication but no change in symptoms.  Denies known kidney disease or structural abnormalities. Past Medical History  Diagnosis Date  . Allergy   . Arthritis   . GERD (gastroesophageal reflux disease)   . Diabetes mellitus     Past Surgical History  Procedure Date  . Vaginal hysterectomy   . Colonoscopy   . Upper gastrointestinal endoscopy     History reviewed. No pertinent family history.  History  Substance Use Topics  . Smoking status: Never Smoker   . Smokeless tobacco: Never Used  . Alcohol Use: No    OB History    Grav Para Term Preterm Abortions TAB SAB Ect Mult Living                  Review of Systems  Constitutional: Negative.   Respiratory: Negative.   Cardiovascular: Negative.   Gastrointestinal: Positive for nausea and abdominal pain. Negative for vomiting, diarrhea and abdominal distention.  Genitourinary: Positive for dysuria, urgency, frequency and hematuria. Negative for flank pain, decreased urine volume, vaginal bleeding, vaginal discharge, difficulty urinating, vaginal pain and pelvic pain.    Allergies  Latex  Home Medications   Current Outpatient Rx  Name Route Sig Dispense Refill  . ASPIRIN 81 MG PO TABS Oral Take 81 mg by  mouth daily.      [name] Kitchen CALCIUM CARBONATE 600 MG PO TABS Oral Take 600 mg by mouth 2 (two) times daily with a meal.      . VITAMIN D 1000 UNITS PO TABS Oral Take 1,000 Units by mouth daily.      [name] Kitchen CINNAMON 500 MG PO TABS Oral Take 500 mg by mouth daily. Takes 6 a day     . CIPROFLOXACIN HCL 500 MG PO TABS Oral Take 1 tablet (500 mg total) by mouth every 12 (twelve) hours. 14 tablet 0  . VITAMIN B 12 PO Oral Take 1,000 mcg by mouth daily.      . OMEGA-3 FATTY ACIDS 1000 MG PO CAPS Oral Take 1 g by mouth daily.      [name] Kitchen GLIPIZIDE XL 10 MG PO TB24 Oral Take 10 mg by mouth 2 (two) times daily.     [name] Kitchen LORATADINE 10 MG PO TABS Oral Take 10 mg by mouth daily.      . MELOXICAM 7.5 MG PO TABS Oral Take 7.5 mg by mouth daily.     [name] Kitchen METFORMIN HCL 1000 MG PO TABS Oral Take 1,000 mg by mouth 2 (two) times daily.     . MULTI-VITAMIN/MINERALS PO TABS Oral Take 1 tablet by mouth daily.      [name] Kitchen OMEPRAZOLE 20 MG PO CPDR Oral Take 20 mg by mouth daily.     . ONGLYZA 5 MG PO TABS Oral Take 5 mg by mouth  daily.     [name] Kitchen PHENAZOPYRIDINE HCL 200 MG PO TABS Oral Take 1 tablet (200 mg total) by mouth 3 (three) times daily. 6 tablet 0    BP 136/83  Pulse 74  Temp 98.1 F (36.7 C) (Oral)  Resp 18  SpO2 95%  Physical Exam  Nursing note and vitals reviewed. Constitutional: She is oriented to person, place, and time. Vital signs are normal. She appears well-developed and well-nourished. She is active and cooperative.  HENT:  Head: Normocephalic.  Eyes: Conjunctivae are normal. Pupils are equal, round, and reactive to light. No scleral icterus.  Neck: Trachea normal. Neck supple.  Cardiovascular: Normal rate, regular rhythm, normal heart sounds and normal pulses.   Pulmonary/Chest: Effort normal and breath sounds normal.  Abdominal: Soft. Normal appearance and bowel sounds are normal. There is tenderness in the suprapubic area. There is no CVA tenderness.  Lymphadenopathy:       Right: No inguinal adenopathy present.        Left: No inguinal adenopathy present.  Neurological: She is alert and oriented to person, place, and time. No cranial nerve deficit or sensory deficit.  Skin: Skin is warm and dry.  Psychiatric: She has a normal mood and affect. Her speech is normal and behavior is normal. Judgment and thought content normal. Cognition and memory are normal.    ED Course  Procedures (including critical care time)  Labs Reviewed  POCT URINALYSIS DIP (DEVICE) - Abnormal; Notable for the following:    Hgb urine dipstick LARGE (*)     Protein, ur 100 (*)     Nitrite POSITIVE (*)     Leukocytes, UA MODERATE (*)  Biochemical Testing Only. Please order routine urinalysis from main lab if confirmatory testing is needed.   All other components within normal limits  URINE CULTURE   No results found.   1. UTI (lower urinary tract infection)   2. Dysuria       MDM  Await urine culture.  Cipro BID as prescribed.  Increase fluids.  Follow up with your urologist if symptoms are not resolved.         Awilda Metro, NP 12/18/11 1640

## 2011-12-18 NOTE — ED Notes (Signed)
Pt has urinary frequency, burning and hematuria for one day.

## 2011-12-20 LAB — URINE CULTURE: Colony Count: NO GROWTH

## 2011-12-22 NOTE — ED Provider Notes (Signed)
Medical screening examination/treatment/procedure(s) were performed by non-physician practitioner and as supervising physician I was immediately available for consultation/collaboration.  Cherly Beach MD   Cherly Beach, MD 12/22/11 2045

## 2012-02-23 DIAGNOSIS — I10 Essential (primary) hypertension: Secondary | ICD-10-CM | POA: Diagnosis not present

## 2012-02-23 DIAGNOSIS — IMO0001 Reserved for inherently not codable concepts without codable children: Secondary | ICD-10-CM | POA: Diagnosis not present

## 2012-02-28 DIAGNOSIS — IMO0001 Reserved for inherently not codable concepts without codable children: Secondary | ICD-10-CM | POA: Diagnosis not present

## 2012-02-28 DIAGNOSIS — Z209 Contact with and (suspected) exposure to unspecified communicable disease: Secondary | ICD-10-CM | POA: Diagnosis not present

## 2012-02-28 DIAGNOSIS — E785 Hyperlipidemia, unspecified: Secondary | ICD-10-CM | POA: Diagnosis not present

## 2012-03-14 DIAGNOSIS — H10019 Acute follicular conjunctivitis, unspecified eye: Secondary | ICD-10-CM | POA: Diagnosis not present

## 2012-03-28 DIAGNOSIS — H10019 Acute follicular conjunctivitis, unspecified eye: Secondary | ICD-10-CM | POA: Diagnosis not present

## 2012-04-13 DIAGNOSIS — N390 Urinary tract infection, site not specified: Secondary | ICD-10-CM | POA: Diagnosis not present

## 2012-05-05 ENCOUNTER — Ambulatory Visit (INDEPENDENT_AMBULATORY_CARE_PROVIDER_SITE_OTHER): Payer: Medicare Other | Admitting: Emergency Medicine

## 2012-05-05 VITALS — BP 136/71 | HR 75 | Temp 97.9°F | Resp 16 | Ht 63.5 in | Wt 165.0 lb

## 2012-05-05 DIAGNOSIS — N390 Urinary tract infection, site not specified: Secondary | ICD-10-CM

## 2012-05-05 DIAGNOSIS — N3000 Acute cystitis without hematuria: Secondary | ICD-10-CM

## 2012-05-05 DIAGNOSIS — R300 Dysuria: Secondary | ICD-10-CM

## 2012-05-05 LAB — POCT UA - MICROSCOPIC ONLY
Bacteria, U Microscopic: NEGATIVE
Casts, Ur, LPF, POC: NEGATIVE
Crystals, Ur, HPF, POC: NEGATIVE
Epithelial cells, urine per micros: NEGATIVE
Mucus, UA: NEGATIVE
Yeast, UA: NEGATIVE

## 2012-05-05 LAB — POCT URINALYSIS DIPSTICK
Bilirubin, UA: NEGATIVE
Glucose, UA: NEGATIVE
Ketones, UA: NEGATIVE
Nitrite, UA: NEGATIVE
Protein, UA: NEGATIVE
Spec Grav, UA: 1.015
Urobilinogen, UA: 0.2
pH, UA: 7

## 2012-05-05 MED ORDER — NITROFURANTOIN MONOHYD MACRO 100 MG PO CAPS: 100.0000 mg | ORAL_CAPSULE | Freq: Two times a day (BID) | ORAL | Status: DC

## 2012-05-05 MED ORDER — PHENAZOPYRIDINE HCL 200 MG PO TABS: 200.0000 mg | ORAL_TABLET | Freq: Three times a day (TID) | ORAL | Status: AC | PRN

## 2012-05-05 NOTE — Patient Instructions (Signed)
Urinary Tract Infection Urinary tract infections (UTIs) can develop anywhere along your urinary tract. Your urinary tract is your body's drainage system for removing wastes and extra water. Your urinary tract includes two kidneys, two ureters, a bladder, and a urethra. Your kidneys are a pair of bean-shaped organs. Each kidney is about the size of your fist. They are located below your ribs, one on each side of your spine. CAUSES Infections are caused by microbes, which are microscopic organisms, including fungi, viruses, and bacteria. These organisms are so small that they can only be seen through a microscope. Bacteria are the microbes that most commonly cause UTIs. SYMPTOMS  Symptoms of UTIs may vary by age and gender of the patient and by the location of the infection. Symptoms in young women typically include a frequent and intense urge to urinate and a painful, burning feeling in the bladder or urethra during urination. Older women and men are more likely to be tired, shaky, and weak and have muscle aches and abdominal pain. A fever may mean the infection is in your kidneys. Other symptoms of a kidney infection include pain in your back or sides below the ribs, nausea, and vomiting. DIAGNOSIS To diagnose a UTI, your caregiver will ask you about your symptoms. Your caregiver also will ask to provide a urine sample. The urine sample will be tested for bacteria and white blood cells. White blood cells are made by your body to help fight infection. TREATMENT  Typically, UTIs can be treated with medication. Because most UTIs are caused by a bacterial infection, they usually can be treated with the use of antibiotics. The choice of antibiotic and length of treatment depend on your symptoms and the type of bacteria causing your infection. HOME CARE INSTRUCTIONS  If you were prescribed antibiotics, take them exactly as your caregiver instructs you. Finish the medication even if you feel better after you  have only taken some of the medication.  Drink enough water and fluids to keep your urine clear or pale yellow.  Avoid caffeine, tea, and carbonated beverages. They tend to irritate your bladder.  Empty your bladder often. Avoid holding urine for long periods of time.  Empty your bladder before and after sexual intercourse.  After a bowel movement, women should cleanse from front to back. Use each tissue only once. SEEK MEDICAL CARE IF:   You have back pain.  You develop a fever.  Your symptoms do not begin to resolve within 3 days. SEEK IMMEDIATE MEDICAL CARE IF:   You have severe back pain or lower abdominal pain.  You develop chills.  You have nausea or vomiting.  You have continued burning or discomfort with urination. MAKE SURE YOU:   Understand these instructions.  Will watch your condition.  Will get help right away if you are not doing well or get worse. Document Released: 02/02/2005 Document Revised: 10/25/2011 Document Reviewed: 06/03/2011 [facility] Patient Information 2013 [place].

## 2012-05-05 NOTE — Progress Notes (Signed)
[facility] [address] [place] [address] [phone]  Date:  05/05/2012   Name:  [name]   DOB:  July 16, 1945   MRN:  [id number]  PCP:  Estill Dooms, MD    Chief Complaint: Urinary Tract Infection   History of Present Illness:  [name] is a 66 y.o. very pleasant female patient who presents with the following:  Treated for UTI with cipro for three days 3 weeks ago.  Developed symptoms again Thursday this week. Has dysuria, urgency and frequency.  Has blood in urine.  No nausea or vomiting, fever or chills, or back pain.  No GYN symptoms.  No abdominal pain.  Recurrent cystitis all her adult life.  There is no problem list on file for this patient.   Past Medical History  Diagnosis Date  . Allergy   . Arthritis   . GERD (gastroesophageal reflux disease)   . Diabetes mellitus     Past Surgical History  Procedure Date  . Vaginal hysterectomy   . Colonoscopy   . Upper gastrointestinal endoscopy     History  Substance Use Topics  . Smoking status: Never Smoker   . Smokeless tobacco: Never Used  . Alcohol Use: No    Family History  Problem Relation Age of Onset  . Cancer Father   . Arthritis Sister   . Arthritis Brother   . Heart disease Maternal Grandmother   . Heart disease Maternal Grandfather   . Heart disease Paternal Grandmother   . Heart disease Paternal Grandfather     Allergies  Allergen Reactions  . Latex Dermatitis    Medication list has been reviewed and updated.  Current Outpatient Prescriptions on File Prior to Visit  Medication Sig Dispense Refill  . aspirin 81 MG tablet Take 81 mg by mouth daily.        . calcium carbonate (OS-CAL) 600 MG TABS Take 600 mg by mouth 2 (two) times daily with a meal.        . Cinnamon 500 MG TABS Take 500 mg by mouth daily. Takes 6 a day       . Cyanocobalamin (VITAMIN B 12 PO) Take 1,000 mcg by mouth daily.        [name] Kitchen GLIPIZIDE XL 10 MG 24 hr tablet Take 10 mg by  mouth 2 (two) times daily.       [name] Kitchen loratadine (CLARITIN) 10 MG tablet Take 10 mg by mouth daily.        . meloxicam (MOBIC) 7.5 MG tablet Take 7.5 mg by mouth daily.       . metFORMIN (GLUCOPHAGE) 1000 MG tablet Take 1,000 mg by mouth 2 (two) times daily.       . Multiple Vitamins-Minerals (MULTIVITAMIN WITH MINERALS) tablet Take 1 tablet by mouth daily.        [name] Kitchen omeprazole (PRILOSEC) 20 MG capsule Take 20 mg by mouth daily.       . cholecalciferol (VITAMIN D) 1000 UNITS tablet Take 1,000 Units by mouth daily.        . fish oil-omega-3 fatty acids 1000 MG capsule Take 1 g by mouth daily.        . ONGLYZA 5 MG TABS tablet Take 5 mg by mouth daily.         Review of Systems:  As per HPI, otherwise negative.    Physical Examination: Filed Vitals:   05/05/12 1431  BP: 136/71  Pulse: 75  Temp:  97.9 F (36.6 C)  Resp: 16   Filed Vitals:   05/05/12 1431  Height: 5' 3.5" (1.613 m)  Weight: 165 lb (74.844 kg)   Body mass index is 28.77 kg/(m^2). Ideal Body Weight: Weight in (lb) to have BMI = 25: 143.1    GEN: WDWN, NAD, Non-toxic, Alert & Oriented x 3 HEENT: Atraumatic, Normocephalic.  Ears and Nose: No external deformity. EXTR: No clubbing/cyanosis/edema NEURO: Normal gait.  PSYCH: Normally interactive. Conversant. Not depressed or anxious appearing.  Calm demeanor.  ABDOMEN  Soft, not tender. No masses.  No cva tenderness  Assessment and Plan: Acute hemorrhagic cystitis. macrobid Pyridium Follow up as needed  Roselee Culver, MD  Results for orders placed in visit on 05/05/12  POCT URINALYSIS DIPSTICK      Component Value Range   Color, UA yellow     Clarity, UA cloudy     Glucose, UA neg     Bilirubin, UA neg     Ketones, UA neg     Spec Grav, UA 1.015     Blood, UA large     pH, UA 7.0     Protein, UA neg     Urobilinogen, UA 0.2     Nitrite, UA neg     Leukocytes, UA large (3+)    POCT UA - MICROSCOPIC ONLY      Component Value Range   WBC, Ur, HPF,  POC tntc     RBC, urine, microscopic tntc     Bacteria, U Microscopic neg     Mucus, UA neg     Epithelial cells, urine per micros neg     Crystals, Ur, HPF, POC neg     Casts, Ur, LPF, POC neg     Yeast, UA neg

## 2012-05-06 LAB — URINE CULTURE

## 2012-05-29 DIAGNOSIS — N3020 Other chronic cystitis without hematuria: Secondary | ICD-10-CM | POA: Diagnosis not present

## 2012-06-08 DIAGNOSIS — IMO0001 Reserved for inherently not codable concepts without codable children: Secondary | ICD-10-CM | POA: Diagnosis not present

## 2012-06-08 DIAGNOSIS — N181 Chronic kidney disease, stage 1: Secondary | ICD-10-CM | POA: Diagnosis not present

## 2012-06-08 DIAGNOSIS — I10 Essential (primary) hypertension: Secondary | ICD-10-CM | POA: Diagnosis not present

## 2012-06-08 DIAGNOSIS — E785 Hyperlipidemia, unspecified: Secondary | ICD-10-CM | POA: Diagnosis not present

## 2012-06-12 DIAGNOSIS — I10 Essential (primary) hypertension: Secondary | ICD-10-CM | POA: Diagnosis not present

## 2012-06-12 DIAGNOSIS — E785 Hyperlipidemia, unspecified: Secondary | ICD-10-CM | POA: Diagnosis not present

## 2012-06-12 DIAGNOSIS — IMO0001 Reserved for inherently not codable concepts without codable children: Secondary | ICD-10-CM | POA: Diagnosis not present

## 2012-07-11 DIAGNOSIS — H04129 Dry eye syndrome of unspecified lacrimal gland: Secondary | ICD-10-CM | POA: Diagnosis not present

## 2012-07-11 DIAGNOSIS — E119 Type 2 diabetes mellitus without complications: Secondary | ICD-10-CM | POA: Diagnosis not present

## 2012-07-25 ENCOUNTER — Telehealth: Payer: Self-pay | Admitting: *Deleted

## 2012-07-25 NOTE — Telephone Encounter (Signed)
[facility] [id number]  [phone] Phone  [phone] Fax   Lantus Solostar Pen  Inject 40 units at bedtime to control diabetes.   [phone] patient ID [id number]  Approved From 07/19/2012 to 05-08-2013

## 2012-07-27 ENCOUNTER — Other Ambulatory Visit: Payer: Self-pay | Admitting: *Deleted

## 2012-07-27 DIAGNOSIS — IMO0001 Reserved for inherently not codable concepts without codable children: Secondary | ICD-10-CM

## 2012-07-27 DIAGNOSIS — E785 Hyperlipidemia, unspecified: Secondary | ICD-10-CM

## 2012-07-27 DIAGNOSIS — I10 Essential (primary) hypertension: Secondary | ICD-10-CM

## 2012-07-31 NOTE — Telephone Encounter (Signed)
error 

## 2012-09-07 ENCOUNTER — Other Ambulatory Visit: Payer: Medicare Other

## 2012-09-07 DIAGNOSIS — E785 Hyperlipidemia, unspecified: Secondary | ICD-10-CM

## 2012-09-07 DIAGNOSIS — IMO0001 Reserved for inherently not codable concepts without codable children: Secondary | ICD-10-CM

## 2012-09-07 DIAGNOSIS — I10 Essential (primary) hypertension: Secondary | ICD-10-CM

## 2012-09-08 LAB — BASIC METABOLIC PANEL
CO2: 24 mmol/L (ref 19–28)
Calcium: 9.4 mg/dL (ref 8.6–10.2)
Chloride: 102 mmol/L (ref 97–108)
Glucose: 146 mg/dL — ABNORMAL HIGH (ref 65–99)
Potassium: 4.1 mmol/L (ref 3.5–5.2)

## 2012-09-08 LAB — HEMOGLOBIN A1C: Hgb A1c MFr Bld: 8.5 % — ABNORMAL HIGH (ref 4.8–5.6)

## 2012-09-11 ENCOUNTER — Encounter: Payer: Self-pay | Admitting: Internal Medicine

## 2012-09-11 ENCOUNTER — Encounter: Payer: Self-pay | Admitting: *Deleted

## 2012-09-11 ENCOUNTER — Ambulatory Visit (INDEPENDENT_AMBULATORY_CARE_PROVIDER_SITE_OTHER): Payer: Medicare Other | Admitting: Internal Medicine

## 2012-09-11 VITALS — BP 134/62 | HR 80 | Temp 98.0°F | Resp 16 | Ht 63.0 in | Wt 160.8 lb

## 2012-09-11 DIAGNOSIS — I10 Essential (primary) hypertension: Secondary | ICD-10-CM | POA: Diagnosis not present

## 2012-09-11 DIAGNOSIS — K219 Gastro-esophageal reflux disease without esophagitis: Secondary | ICD-10-CM

## 2012-09-11 DIAGNOSIS — IMO0001 Reserved for inherently not codable concepts without codable children: Secondary | ICD-10-CM

## 2012-09-11 DIAGNOSIS — E785 Hyperlipidemia, unspecified: Secondary | ICD-10-CM

## 2012-09-11 NOTE — Progress Notes (Signed)
Subjective:    Patient ID: [name], female    DOB: June 04, 1945, 67 y.o.   MRN: [id number]  HPI Patient has done fairly well since her last visit. Blood pressure is under good control. Reflux is under good control. Diabetic control has not changed. She has not been successful in losing weight.   Review of Systems Review of Systems - History obtained from the patient General ROS: negative Psychological ROS: negative Ophthalmic ROS: positive for - uses glasses ENT ROS: positive for - hearing change Allergy and Immunology ROS: negative Hematological and Lymphatic ROS: negative Endocrine ROS: positive for - diabetes Breast ROS: negative Respiratory ROS: no cough, shortness of breath, or wheezing Cardiovascular ROS: no chest pain or dyspnea on exertion Gastrointestinal ROS: no abdominal pain, change in bowel habits, or black or bloody stools Genito-Urinary ROS: no dysuria, trouble voiding, or hematuria Musculoskeletal ROS: positive for - joint pain Neurological ROS: no TIA or stroke symptoms Dermatological ROS: negative    Objective:   Physical Exam  BP 134/62  Pulse 80  Temp(Src) 98 F (36.7 C)  Resp 16  Ht 5' 3"  (1.6 m)  Wt 160 lb 12.8 oz (72.938 kg)  BMI 28.49 kg/m2  General Appearance:    Alert, cooperative, no distress, appears stated age.moderately overweight  Head:    Normocephalic, without obvious abnormality, atraumatic  Eyes:    PERRL, conjunctiva/corneas clear, EOM's intact, fundi    benign, both eyes  Ears:    Normal TM's and external ear canals, both ears  Nose:   Nares normal, septum midline, mucosa normal, no drainage    or sinus tenderness  Throat:   Lips, mucosa, and tongue normal; teeth and gums normal  Neck:   Supple, symmetrical, trachea midline, no adenopathy;    thyroid:  no enlargement/tenderness/nodules; no carotid   bruit or JVD  Back:     Symmetric, no curvature, ROM normal, no CVA tenderness  Lungs:     Clear to auscultation  bilaterally, respirations unlabored  Chest Wall:    No tenderness or deformity   Heart:    Regular rate and rhythm, S1 and S2 normal, no murmur, rub   or gallop  Abdomen:     Soft, non-tender, bowel sounds active all four quadrants,    no masses, no organomegaly  Pulses:   2+ and symmetric all extremities  Skin:   Skin color, texture, turgor normal, no rashes or lesions  Lymph nodes:   Cervical, supraclavicular, and axillary nodes normal  Neurologic:   CNII-XII intact, normal strength, sensation and reflexes    throughout     Lab reports 8/10/212 BMP: Glucose 142, BUN 13, Creatinine 0.81 HA1C: 8.1 Lipid: Cholesterol 181, Triglycerides 112, HDL 50, VLDL 22, LDL 109 03/18/2011 BMP: glucose 208, BUN 15, Creatinine 0.80 HgbA1c 8.8 Lipid: cholesterol 196, triglycerides 99, HDL 56, LDL 120 06/24/2011 BMP Glucose 165 Bun 17 Creatine 0.69  HA1C 8.2  Lipid Panel Cholesterol 203 Triglycerides 89 Hdl 59 Ldl 126  10/28/2011 BMP Glucose 156 Bun 14 Creatinine 0.67  HA1C 9.5  Lipid Panel Cholesterol 208 Triglycerides 87 Hdl 61 Ldl 130  02/23/2012 BMP Glucose 162 Bun 12 Creatinine 0.70  HA1C 9.3  06/08/2012  BMP: glucose 143, BUN 14, Creatinine 0.66 A1C: 8.5 Lipid: Cholesterol 195, Triglycerides 76, HDL 55, LDL 125 09/07/12 A1c 8.5     CMP: glu 146   Assessment & Plan:  1. Type II or unspecified type diabetes mellitus without mention of complication, uncontrolled : Controlled. I think  she could do better with her diet. - Hemoglobin A1c; Future  2. Unspecified essential hypertension controlled - CMP; Future  3. Other and unspecified hyperlipidemia controlled - Lipid panel; Future  4. GERD (gastroesophageal reflux disease) Nearly symptom-free

## 2012-09-11 NOTE — Patient Instructions (Signed)
Continue current medications. 

## 2012-09-15 ENCOUNTER — Encounter: Payer: Self-pay | Admitting: Internal Medicine

## 2012-09-15 DIAGNOSIS — I10 Essential (primary) hypertension: Secondary | ICD-10-CM | POA: Insufficient documentation

## 2012-09-15 DIAGNOSIS — E119 Type 2 diabetes mellitus without complications: Secondary | ICD-10-CM | POA: Insufficient documentation

## 2012-09-15 DIAGNOSIS — K219 Gastro-esophageal reflux disease without esophagitis: Secondary | ICD-10-CM | POA: Insufficient documentation

## 2012-09-15 DIAGNOSIS — E785 Hyperlipidemia, unspecified: Secondary | ICD-10-CM | POA: Insufficient documentation

## 2012-11-20 ENCOUNTER — Other Ambulatory Visit: Payer: Self-pay | Admitting: Internal Medicine

## 2012-11-24 DIAGNOSIS — S46119A Strain of muscle, fascia and tendon of long head of biceps, unspecified arm, initial encounter: Secondary | ICD-10-CM | POA: Insufficient documentation

## 2012-11-26 DIAGNOSIS — M66329 Spontaneous rupture of flexor tendons, unspecified upper arm: Secondary | ICD-10-CM | POA: Diagnosis not present

## 2012-12-14 ENCOUNTER — Other Ambulatory Visit: Payer: Medicare Other

## 2012-12-14 DIAGNOSIS — I10 Essential (primary) hypertension: Secondary | ICD-10-CM | POA: Diagnosis not present

## 2012-12-14 DIAGNOSIS — IMO0001 Reserved for inherently not codable concepts without codable children: Secondary | ICD-10-CM

## 2012-12-14 DIAGNOSIS — E785 Hyperlipidemia, unspecified: Secondary | ICD-10-CM

## 2012-12-15 LAB — COMPREHENSIVE METABOLIC PANEL
ALT: 50 IU/L — ABNORMAL HIGH (ref 0–32)
Albumin/Globulin Ratio: 1.1 (ref 1.1–2.5)
BUN/Creatinine Ratio: 18 (ref 11–26)
GFR calc Af Amer: 99 mL/min/{1.73_m2} (ref >59)
GFR calc non Af Amer: 86 mL/min/{1.73_m2} (ref >59)
Potassium: 4 mmol/L (ref 3.5–5.2)
Sodium: 139 mmol/L (ref 134–144)
Total Bilirubin: 0.5 mg/dL (ref 0.0–1.2)
Total Protein: 7.3 g/dL (ref 6.0–8.5)

## 2012-12-15 LAB — LIPID PANEL: VLDL Cholesterol Cal: 14 mg/dL (ref 5–40)

## 2012-12-18 ENCOUNTER — Encounter: Payer: Self-pay | Admitting: Internal Medicine

## 2012-12-18 ENCOUNTER — Ambulatory Visit (INDEPENDENT_AMBULATORY_CARE_PROVIDER_SITE_OTHER): Payer: Medicare Other | Admitting: Internal Medicine

## 2012-12-18 VITALS — BP 142/84 | HR 60 | Temp 97.4°F | Resp 16 | Ht 63.0 in | Wt 162.6 lb

## 2012-12-18 DIAGNOSIS — IMO0001 Reserved for inherently not codable concepts without codable children: Secondary | ICD-10-CM

## 2012-12-18 DIAGNOSIS — K219 Gastro-esophageal reflux disease without esophagitis: Secondary | ICD-10-CM

## 2012-12-18 DIAGNOSIS — I10 Essential (primary) hypertension: Secondary | ICD-10-CM

## 2012-12-18 DIAGNOSIS — E785 Hyperlipidemia, unspecified: Secondary | ICD-10-CM | POA: Diagnosis not present

## 2012-12-18 DIAGNOSIS — S46211A Strain of muscle, fascia and tendon of other parts of biceps, right arm, initial encounter: Secondary | ICD-10-CM

## 2012-12-18 DIAGNOSIS — S43499A Other sprain of unspecified shoulder joint, initial encounter: Secondary | ICD-10-CM

## 2012-12-18 DIAGNOSIS — S46819A Strain of other muscles, fascia and tendons at shoulder and upper arm level, unspecified arm, initial encounter: Secondary | ICD-10-CM

## 2012-12-18 NOTE — Patient Instructions (Signed)
Continue current medications. 

## 2012-12-18 NOTE — Progress Notes (Signed)
. Subjective:    Patient ID: [name], female    DOB: [birth year], 67 y.o.   MRN: [id number]  HPI  Other and unspecified hyperlipidemia: stable  GERD (gastroesophageal reflux disease): asymptomatic  Type II or unspecified type diabetes mellitus without mention of complication, uncontrolled: unchanged. A1c still high at 8.3  Unspecified essential hypertension: stable and controlled  Biceps tendon rupture, proximal, right, initial encounter: Tore atendon in the right shoulder on July 19,2014. A week after the initial injury, she had rupture of the long head of the biceps.   Review of Systems  Constitutional:       Gained 2 pounds since last office visit in May 2014  HENT: Positive for hearing loss.   Eyes: Negative.   Respiratory: Negative.   Cardiovascular: Negative for chest pain, palpitations and leg swelling.  Gastrointestinal: Negative.   Endocrine:       Diabetic  Genitourinary: Negative.   Musculoskeletal: Positive for myalgias, back pain and arthralgias. Negative for joint swelling and gait problem.  Skin: Negative.   Neurological: Negative.   Hematological: Negative.   Psychiatric/Behavioral: Negative.        Objective:BP 142/84  Pulse 60  Temp(Src) 97.4 F (36.3 C) (Oral)  Resp 16  Ht 5' 3"  (1.6 m)  Wt 162 lb 9.6 oz (73.755 kg)  BMI 28.81 kg/m2  SpO2 99%    Physical Exam  Constitutional: She is oriented to person, place, and time. She appears well-developed and well-nourished. No distress.  HENT:  Head: Normocephalic and atraumatic.  Nose: Nose normal.  Mild bilateral hearing loss.  Eyes: Conjunctivae and EOM are normal. Pupils are equal, round, and reactive to light.  Neck: Neck supple. No JVD present. No tracheal deviation present. No thyromegaly present.  Cardiovascular: Normal rate, regular rhythm, normal heart sounds and intact distal pulses.  Exam reveals no gallop and no friction rub.   No murmur heard. Pulmonary/Chest: Effort normal and  breath sounds normal. No respiratory distress. She has no wheezes. She has no rales.  Abdominal: Bowel sounds are normal. She exhibits no distension and no mass. There is no tenderness.  Musculoskeletal: Normal range of motion. She exhibits no edema and no tenderness.  Patient has evidence of  rupture long head of the biceps proximally.in the right mid forearm. It is not tender. Previous bruising is resolved. The muscle is balled up in the mid right forearm.  Lymphadenopathy:    She has no cervical adenopathy.  Neurological: She is alert and oriented to person, place, and time. She has normal reflexes. No cranial nerve deficit. Coordination normal.  Skin: No rash noted. No erythema. No pallor.  Psychiatric: She has a normal mood and affect. Her behavior is normal. Thought content normal.      Appointment on 12/14/2012  Component Date Value Range Status  . Hemoglobin A1C 12/14/2012 8.3* 4.8 - 5.6 % Final   Comment:          Increased risk for diabetes: 5.7 - 6.4                                   Diabetes: >6.4                                   Glycemic control for adults with diabetes: <7.0  . Estimated average glucose 12/14/2012 192   Final  .  Glucose 12/14/2012 151* 65 - 99 mg/dL Final  . BUN 12/14/2012 13  8 - 27 mg/dL Final  . Creatinine, Ser 12/14/2012 0.73  0.57 - 1.00 mg/dL Final  . GFR calc non Af Amer 12/14/2012 86  >59 mL/min/1.73 Final  . GFR calc Af Amer 12/14/2012 99  >59 mL/min/1.73 Final  . BUN/Creatinine Ratio 12/14/2012 18  11 - 26 Final  . Sodium 12/14/2012 139  134 - 144 mmol/L Final  . Potassium 12/14/2012 4.0  3.5 - 5.2 mmol/L Final  . Chloride 12/14/2012 102  97 - 108 mmol/L Final  . CO2 12/14/2012 23  18 - 29 mmol/L Final  . Calcium 12/14/2012 9.8  8.6 - 10.2 mg/dL Final  . Total Protein 12/14/2012 7.3  6.0 - 8.5 g/dL Final  . Albumin 12/14/2012 3.9  3.6 - 4.8 g/dL Final  . Globulin, Total 12/14/2012 3.4  1.5 - 4.5 g/dL Final  . Albumin/Globulin Ratio  12/14/2012 1.1  1.1 - 2.5 Final  . Total Bilirubin 12/14/2012 0.5  0.0 - 1.2 mg/dL Final  . Alkaline Phosphatase 12/14/2012 91  39 - 117 IU/L Final  . AST 12/14/2012 52* 0 - 40 IU/L Final  . ALT 12/14/2012 50* 0 - 32 IU/L Final  . Cholesterol, Total 12/14/2012 230* 100 - 199 mg/dL Final  . Triglycerides 12/14/2012 71  0 - 149 mg/dL Final  . HDL 12/14/2012 77  >39 mg/dL Final   Comment: According to ATP-III Guidelines, HDL-C >59 mg/dL is considered a                          negative risk factor for CHD.  [name] Kitchen VLDL Cholesterol Cal 12/14/2012 14  5 - 40 mg/dL Final  . LDL Calculated 12/14/2012 139* 0 - 99 mg/dL Final  . Chol/HDL Ratio 12/14/2012 3.0  0.0 - 4.4 ratio units Final       Assessment & Plan:  Other and unspecified hyperlipidemia: LDL is slightly high. Advised patient to lose weight. No additional medicines added at this time.   - Plan: Lipid panel  GERD (gastroesophageal reflux disease): Rare heartburn. She uses TUMS.  Type II or unspecified type diabetes mellitus without mention of complication, uncontrolled: Continued problems with dietary indiscretion.  She is gaining weight, up 2 pounds since last visit.   - Plan: Hemoglobin H4F, Basic metabolic panel  Unspecified essential hypertension: Controlled   - Plan: Basic metabolic panel  Biceps tendon rupture, proximal, right, initial encounter: Patient has been seeing an orthopedist. No further treatment is warranted or needed at this time. She will have residual deformity of the musculature of the right upper arm as result of this

## 2013-01-04 ENCOUNTER — Other Ambulatory Visit: Payer: Self-pay | Admitting: Internal Medicine

## 2013-02-18 ENCOUNTER — Other Ambulatory Visit: Payer: Self-pay | Admitting: Nurse Practitioner

## 2013-03-01 ENCOUNTER — Other Ambulatory Visit: Payer: Self-pay | Admitting: *Deleted

## 2013-03-04 ENCOUNTER — Telehealth: Payer: Self-pay | Admitting: *Deleted

## 2013-03-04 ENCOUNTER — Other Ambulatory Visit: Payer: Self-pay | Admitting: *Deleted

## 2013-03-04 MED ORDER — INSULIN DETEMIR 100 UNIT/ML FLEXPEN: 40.0000 [IU] | Freq: Every day | SUBCUTANEOUS | Status: DC

## 2013-03-04 NOTE — Telephone Encounter (Signed)
Message copied by Eilene Ghazi on Mon Mar 04, 2013  4:24 PM ------      Message from: Estill Dooms      Created: Mon Mar 04, 2013 11:54 AM      Regarding: RE: Change in Medication       OK to switch from Lantus to Levemir.at the same units given at each injection      ----- Message -----         From: Eilene Ghazi, RMA         Sent: 03/01/2013   2:40 PM           To: Estill Dooms, MD      Subject: Change in Medication                                     [name] ([name]/[name]) would like to change their insulin prescription to Levemir. She states that they was given some by a friend and it lower the blood sugar and wants to switch.       ------

## 2013-03-04 NOTE — Telephone Encounter (Signed)
Called patient to inform her that Dr. Nyoka Cowden agreed to her insulin change and prescription was sent to the pharmacy.

## 2013-03-25 ENCOUNTER — Other Ambulatory Visit: Payer: Medicare Other

## 2013-03-25 DIAGNOSIS — E785 Hyperlipidemia, unspecified: Secondary | ICD-10-CM | POA: Diagnosis not present

## 2013-03-25 DIAGNOSIS — IMO0001 Reserved for inherently not codable concepts without codable children: Secondary | ICD-10-CM

## 2013-03-25 DIAGNOSIS — I10 Essential (primary) hypertension: Secondary | ICD-10-CM

## 2013-03-26 LAB — BASIC METABOLIC PANEL
CO2: 25 mmol/L (ref 18–29)
Calcium: 9.7 mg/dL (ref 8.6–10.2)
Creatinine, Ser: 0.58 mg/dL (ref 0.57–1.00)
Potassium: 4.1 mmol/L (ref 3.5–5.2)
Sodium: 142 mmol/L (ref 134–144)

## 2013-03-26 LAB — HEMOGLOBIN A1C
Est. average glucose Bld gHb Est-mCnc: 209 mg/dL
Hgb A1c MFr Bld: 8.9 % — ABNORMAL HIGH (ref 4.8–5.6)

## 2013-03-26 LAB — LIPID PANEL: HDL: 69 mg/dL (ref >39)

## 2013-03-27 ENCOUNTER — Encounter: Payer: Self-pay | Admitting: Internal Medicine

## 2013-03-27 ENCOUNTER — Ambulatory Visit (INDEPENDENT_AMBULATORY_CARE_PROVIDER_SITE_OTHER): Payer: Medicare Other | Admitting: Internal Medicine

## 2013-03-27 VITALS — BP 130/78 | HR 71 | Temp 97.7°F | Wt 157.8 lb

## 2013-03-27 DIAGNOSIS — IMO0001 Reserved for inherently not codable concepts without codable children: Secondary | ICD-10-CM | POA: Diagnosis not present

## 2013-03-27 DIAGNOSIS — I10 Essential (primary) hypertension: Secondary | ICD-10-CM

## 2013-03-27 DIAGNOSIS — E785 Hyperlipidemia, unspecified: Secondary | ICD-10-CM

## 2013-03-27 DIAGNOSIS — Z23 Encounter for immunization: Secondary | ICD-10-CM

## 2013-03-27 DIAGNOSIS — K219 Gastro-esophageal reflux disease without esophagitis: Secondary | ICD-10-CM

## 2013-03-27 NOTE — Progress Notes (Signed)
Subjective:    Patient ID: [name], female    DOB: February 22, 1946, 67 y.o.   MRN: [id number] Chief Complaint  Patient presents with  . Medical Managment of Chronic Issues    3 month f/u with labs printed  . Immunizations    will get flu shot today and declines the Tdap due to a reaction with last TD.  [name] Kitchen other    Dexa scan was 10 yrs ago and was normal    HPI   Current Outpatient Prescriptions on File Prior to Visit  Medication Sig Dispense Refill  . aspirin 81 MG tablet Take 81 mg by mouth daily.        . calcium carbonate (OS-CAL) 600 MG TABS Take 600 mg by mouth 2 (two) times daily with a meal.        . cholecalciferol (VITAMIN D) 1000 UNITS tablet Take 1,000 Units by mouth daily.        . Cinnamon 500 MG TABS Take 500 mg by mouth daily. Takes 6 a day       . ciprofloxacin (CIPRO) 250 MG tablet Take 250 mg by mouth 2 (two) times daily. Take one tablet after intercourse to prevent urinary tract infection      . Cyanocobalamin (VITAMIN B 12 PO) Take 1,000 mcg by mouth daily.        . fish oil-omega-3 fatty acids 1000 MG capsule Take 1 g by mouth daily.        . insulin detemir (LEVEMIR) 100 unit/ml SOLN Inject 40 Units into the skin daily at 10 pm.  100 mL  3  . loratadine (CLARITIN) 10 MG tablet Take 10 mg by mouth daily.        . meloxicam (MOBIC) 7.5 MG tablet TAKE ONE TABLET BY MOUTH DAILY PRFARTHRITIS PAIN  90 tablet  0  . metFORMIN (GLUCOPHAGE) 1000 MG tablet TAKE 1 TABLET BY MOUTH TWICE DAILY TO TREAT DIABETES  180 tablet  0  . Multiple Vitamins-Minerals (MULTIVITAMIN WITH MINERALS) tablet Take 1 tablet by mouth daily.        [name] Kitchen omeprazole (PRILOSEC) 20 MG capsule TAKE ONE CAPSULE BY MOUTH DAILY TO REDUCE STOMACH ACID  90 capsule  0   No current facility-administered medications on file prior to visit.    Review of Systems  Constitutional:       Gained 2 pounds since last office visit in May 2014  HENT: Positive for hearing loss.   Eyes: Negative.   Respiratory:  Negative.   Cardiovascular: Negative for chest pain, palpitations and leg swelling.  Gastrointestinal: Negative.   Endocrine:       Diabetic  Genitourinary: Negative.   Musculoskeletal: Positive for arthralgias, back pain and myalgias. Negative for gait problem and joint swelling.  Skin: Negative.   Neurological: Negative.   Hematological: Negative.   Psychiatric/Behavioral: Negative.        Objective:BP 130/78  Pulse 71  Temp(Src) 97.7 F (36.5 C) (Oral)  Wt 157 lb 12.8 oz (71.578 kg)  SpO2 98%    Physical Exam  Constitutional: She is oriented to person, place, and time. She appears well-developed and well-nourished. No distress.  HENT:  Head: Normocephalic and atraumatic.  Nose: Nose normal.  Mild bilateral hearing loss.  Eyes: Conjunctivae and EOM are normal. Pupils are equal, round, and reactive to light.  Neck: Neck supple. No JVD present. No tracheal deviation present. No thyromegaly present.  Cardiovascular: Normal rate, regular rhythm, normal heart sounds and intact distal pulses.  Exam reveals no gallop and no friction rub.   No murmur heard. Pulmonary/Chest: Effort normal and breath sounds normal. No respiratory distress. She has no wheezes. She has no rales.  Abdominal: Bowel sounds are normal. She exhibits no distension and no mass. There is no tenderness.  Musculoskeletal: Normal range of motion. She exhibits no edema and no tenderness.  Patient has evidence of  rupture long head of the biceps proximally.in the right mid forearm. It is not tender. Previous bruising is resolved. The muscle is balled up in the mid right forearm.  Lymphadenopathy:    She has no cervical adenopathy.  Neurological: She is alert and oriented to person, place, and time. She has normal reflexes. No cranial nerve deficit. Coordination normal.  Skin: No rash noted. No erythema. No pallor.  Psychiatric: She has a normal mood and affect. Her behavior is normal. Thought content normal.     Appointment on 03/25/2013  Component Date Value Range Status  . Hemoglobin A1C 03/25/2013 8.9* 4.8 - 5.6 % Final   Comment:          Increased risk for diabetes: 5.7 - 6.4                                   Diabetes: >6.4                                   Glycemic control for adults with diabetes: <7.0  . Estimated average glucose 03/25/2013 209   Final  . Glucose 03/25/2013 109* 65 - 99 mg/dL Final  . BUN 03/25/2013 11  8 - 27 mg/dL Final  . Creatinine, Ser 03/25/2013 0.58  0.57 - 1.00 mg/dL Final  . GFR calc non Af Amer 03/25/2013 96  >59 mL/min/1.73 Final  . GFR calc Af Amer 03/25/2013 111  >59 mL/min/1.73 Final  . BUN/Creatinine Ratio 03/25/2013 19  11 - 26 Final  . Sodium 03/25/2013 142  134 - 144 mmol/L Final  . Potassium 03/25/2013 4.1  3.5 - 5.2 mmol/L Final  . Chloride 03/25/2013 101  97 - 108 mmol/L Final  . CO2 03/25/2013 25  18 - 29 mmol/L Final  . Calcium 03/25/2013 9.7  8.6 - 10.2 mg/dL Final  . Cholesterol, Total 03/25/2013 225* 100 - 199 mg/dL Final  . Triglycerides 03/25/2013 89  0 - 149 mg/dL Final  . HDL 03/25/2013 69  >39 mg/dL Final   Comment: According to ATP-III Guidelines, HDL-C >59 mg/dL is considered a                          negative risk factor for CHD.  [name] Kitchen VLDL Cholesterol Cal 03/25/2013 18  5 - 40 mg/dL Final  . LDL Calculated 03/25/2013 138* 0 - 99 mg/dL Final  . Chol/HDL Ratio 03/25/2013 3.3  0.0 - 4.4 ratio units Final   Comment:                                   T. Chol/HDL Ratio  Men  Women                                                        1/2 Avg.Risk  3.4    3.3                                                            Avg.Risk  5.0    4.4                                                         2X Avg.Risk  9.6    7.1                                                         3X Avg.Risk 23.4   11.0         Assessment & Plan:  1. GERD (gastroesophageal reflux  disease) Asymptomatic since taking acid reducing agents  2. Other and unspecified hyperlipidemia Controlled - Lipid panel; Future  3. Type II or unspecified type diabetes mellitus without mention of complication, uncontrolled Continues out of control. Fasting glucose is satisfactory, however A1c continues to rise. She is noncompliant with her diet. She has managed to lose 4 pounds since last seen. Chosen not to increase insulin this visit. - Basic metabolic panel; Future - Hemoglobin A1c; Future - Microalbumin/Creatinine Ratio, Urine  4. Unspecified essential hypertension Controlled - Basic metabolic panel; Future  5. Need for prophylactic vaccination and inoculation against influenza Administered

## 2013-03-27 NOTE — Patient Instructions (Signed)
Continue current medications. Your focus needs to be on elimination of sweets and starches. Continue to lose weight.

## 2013-04-07 ENCOUNTER — Other Ambulatory Visit: Payer: Self-pay | Admitting: Internal Medicine

## 2013-04-15 ENCOUNTER — Other Ambulatory Visit: Payer: Self-pay | Admitting: *Deleted

## 2013-04-15 MED ORDER — METFORMIN HCL 1000 MG PO TABS: ORAL_TABLET | ORAL | Status: DC

## 2013-04-15 NOTE — Telephone Encounter (Signed)
Patient accidentally threw away her bottle of Metformin and needs a new Rx called to pharmacy. Called in Rx to [facility] and patient notified.

## 2013-07-01 ENCOUNTER — Other Ambulatory Visit: Payer: Self-pay | Admitting: Internal Medicine

## 2013-07-01 DIAGNOSIS — Z1231 Encounter for screening mammogram for malignant neoplasm of breast: Secondary | ICD-10-CM

## 2013-07-12 ENCOUNTER — Other Ambulatory Visit: Payer: Medicare Other

## 2013-07-12 ENCOUNTER — Other Ambulatory Visit: Payer: Self-pay | Admitting: Internal Medicine

## 2013-07-12 DIAGNOSIS — E785 Hyperlipidemia, unspecified: Secondary | ICD-10-CM

## 2013-07-12 DIAGNOSIS — E1165 Type 2 diabetes mellitus with hyperglycemia: Principal | ICD-10-CM

## 2013-07-12 DIAGNOSIS — I10 Essential (primary) hypertension: Secondary | ICD-10-CM

## 2013-07-12 DIAGNOSIS — IMO0001 Reserved for inherently not codable concepts without codable children: Secondary | ICD-10-CM | POA: Diagnosis not present

## 2013-07-13 LAB — LIPID PANEL
CHOLESTEROL TOTAL: 225 mg/dL — AB (ref 100–199)
Chol/HDL Ratio: 3.1 ratio units (ref 0.0–4.4)
HDL: 73 mg/dL (ref >39)
LDL CALC: 139 mg/dL — AB (ref 0–99)
TRIGLYCERIDES: 65 mg/dL (ref 0–149)
VLDL Cholesterol Cal: 13 mg/dL (ref 5–40)

## 2013-07-13 LAB — HEMOGLOBIN A1C
Est. average glucose Bld gHb Est-mCnc: 200 mg/dL
Hgb A1c MFr Bld: 8.6 % — ABNORMAL HIGH (ref 4.8–5.6)

## 2013-07-13 LAB — BASIC METABOLIC PANEL
BUN/Creatinine Ratio: 19 (ref 11–26)
BUN: 12 mg/dL (ref 8–27)
CO2: 24 mmol/L (ref 18–29)
CREATININE: 0.63 mg/dL (ref 0.57–1.00)
Calcium: 9.4 mg/dL (ref 8.7–10.3)
Chloride: 102 mmol/L (ref 97–108)
GFR calc Af Amer: 108 mL/min/{1.73_m2} (ref >59)
GFR, EST NON AFRICAN AMERICAN: 94 mL/min/{1.73_m2} (ref >59)
Glucose: 96 mg/dL (ref 65–99)
POTASSIUM: 4.2 mmol/L (ref 3.5–5.2)
Sodium: 143 mmol/L (ref 134–144)

## 2013-07-16 ENCOUNTER — Ambulatory Visit (INDEPENDENT_AMBULATORY_CARE_PROVIDER_SITE_OTHER): Payer: Medicare Other | Admitting: Internal Medicine

## 2013-07-16 ENCOUNTER — Encounter: Payer: Self-pay | Admitting: Internal Medicine

## 2013-07-16 VITALS — BP 152/80 | HR 82 | Temp 98.6°F | Wt 161.4 lb

## 2013-07-16 DIAGNOSIS — E785 Hyperlipidemia, unspecified: Secondary | ICD-10-CM | POA: Diagnosis not present

## 2013-07-16 DIAGNOSIS — K219 Gastro-esophageal reflux disease without esophagitis: Secondary | ICD-10-CM | POA: Diagnosis not present

## 2013-07-16 DIAGNOSIS — IMO0001 Reserved for inherently not codable concepts without codable children: Secondary | ICD-10-CM

## 2013-07-16 DIAGNOSIS — I10 Essential (primary) hypertension: Secondary | ICD-10-CM | POA: Diagnosis not present

## 2013-07-16 DIAGNOSIS — E1165 Type 2 diabetes mellitus with hyperglycemia: Principal | ICD-10-CM

## 2013-07-16 NOTE — Progress Notes (Signed)
Patient ID: [name], female   DOB: 1946-02-13, 68 y.o.   MRN: [id number]    Location:    [facility]  Place of Service:  OFFICE   Allergies  Allergen Reactions  . Latex Dermatitis  . Tdap [Diphth-Acell Pertussis-Tetanus]     Chief Complaint  Patient presents with  . Medical Managment of Chronic Issues    3 month f/u & discuss labs (printed) no refills needed.    HPI:  Type II or unspecified type diabetes mellitus without mention of complication, uncontrolled - continues to run high A1c. She plans to lose weight by resuming the Atkins diet along with her husband. Has not been compliant with diabetic diet.  Unspecified essential hypertension -Mild elevation in SBP  Other and unspecified hyperlipidemia - controlled  GERD (gastroesophageal reflux disease): rare symptoms of heartburn    Medications: Patient's Medications  New Prescriptions   No medications on file  Previous Medications   ASPIRIN 81 MG TABLET    Take 81 mg by mouth daily.     CALCIUM CARBONATE (OS-CAL) 600 MG TABS    Take 600 mg by mouth 2 (two) times daily with a meal.     CHOLECALCIFEROL (VITAMIN D) 1000 UNITS TABLET    Take 1,000 Units by mouth daily.     CINNAMON 500 MG TABS    Take 1,000 mg by mouth. Take 2 tablets daily   CIPROFLOXACIN (CIPRO) 250 MG TABLET    Take 250 mg by mouth 2 (two) times daily. Take one tablet after intercourse to prevent urinary tract infection   CYANOCOBALAMIN (VITAMIN B 12 PO)    Take 1,000 mcg by mouth daily.     FISH OIL-OMEGA-3 FATTY ACIDS 1000 MG CAPSULE    Take 1 g by mouth daily.     INSULIN DETEMIR (LEVEMIR) 100 UNIT/ML SOLN    Inject 40 Units into the skin daily at 10 pm.   LORATADINE (CLARITIN) 10 MG TABLET    Take 10 mg by mouth daily.     MELOXICAM (MOBIC) 7.5 MG TABLET    TAKE 1 TABLET BY MOUTH EVERY DAY FOR ARTHRITIS PAIN   METFORMIN (GLUCOPHAGE) 1000 MG TABLET    Take one tablet by mouth twice daily to treat diabetes   MULTIPLE VITAMINS-MINERALS (MULTIVITAMIN  WITH MINERALS) TABLET    Take 1 tablet by mouth daily.     OMEPRAZOLE (PRILOSEC) 20 MG CAPSULE    TAKE ONE CAPSULE BY MOUTH DAILY TO REDUCE STOMACH ACID  Modified Medications   No medications on file  Discontinued Medications   No medications on file     Review of Systems  Constitutional:       Gained 2 pounds since last office visit in May 2014  HENT: Positive for hearing loss.   Eyes: Negative.   Respiratory: Negative.   Cardiovascular: Negative for chest pain, palpitations and leg swelling.  Gastrointestinal:       Occasional heartburn  Endocrine:       Diabetic  Genitourinary: Negative.   Musculoskeletal: Positive for arthralgias, back pain and myalgias. Negative for gait problem and joint swelling.  Skin: Negative.   Neurological: Negative.   Hematological: Negative.   Psychiatric/Behavioral: Negative.     Filed Vitals:   07/16/13 1503  BP: 152/80  Pulse: 82  Temp: 98.6 F (37 C)  TempSrc: Oral  Weight: 161 lb 6.4 oz (73.211 kg)  SpO2: 97%   Physical Exam  Constitutional: She is oriented to person, place, and time. She appears  well-developed and well-nourished. No distress.  HENT:  Head: Normocephalic and atraumatic.  Nose: Nose normal.  Mild bilateral hearing loss.  Eyes: Conjunctivae and EOM are normal. Pupils are equal, round, and reactive to light.  Neck: Neck supple. No JVD present. No tracheal deviation present. No thyromegaly present.  Cardiovascular: Normal rate, regular rhythm, normal heart sounds and intact distal pulses.  Exam reveals no gallop and no friction rub.   No murmur heard. Pulmonary/Chest: Effort normal and breath sounds normal. No respiratory distress. She has no wheezes. She has no rales.  Abdominal: Bowel sounds are normal. She exhibits no distension and no mass. There is no tenderness.  Musculoskeletal: Normal range of motion. She exhibits no edema and no tenderness.  Patient has evidence of  rupture long head of the biceps  proximally.in the right mid forearm. It is not tender. Previous bruising is resolved. The muscle is balled up in the mid right forearm.  Lymphadenopathy:    She has no cervical adenopathy.  Neurological: She is alert and oriented to person, place, and time. She has normal reflexes. No cranial nerve deficit. Coordination normal.  Skin: No rash noted. No erythema. No pallor.  Psychiatric: She has a normal mood and affect. Her behavior is normal. Thought content normal.     Labs reviewed: Appointment on 07/12/2013  Component Date Value Ref Range Status  . Glucose 07/12/2013 96  65 - 99 mg/dL Final  . BUN 07/12/2013 12  8 - 27 mg/dL Final  . Creatinine, Ser 07/12/2013 0.63  0.57 - 1.00 mg/dL Final  . GFR calc non Af Amer 07/12/2013 94  >59 mL/min/1.73 Final  . GFR calc Af Amer 07/12/2013 108  >59 mL/min/1.73 Final  . BUN/Creatinine Ratio 07/12/2013 19  11 - 26 Final  . Sodium 07/12/2013 143  134 - 144 mmol/L Final  . Potassium 07/12/2013 4.2  3.5 - 5.2 mmol/L Final  . Chloride 07/12/2013 102  97 - 108 mmol/L Final  . CO2 07/12/2013 24  18 - 29 mmol/L Final  . Calcium 07/12/2013 9.4  8.7 - 10.3 mg/dL Final  . Hemoglobin A1C 07/12/2013 8.6* 4.8 - 5.6 % Final   Comment:          Increased risk for diabetes: 5.7 - 6.4                                   Diabetes: >6.4                                   Glycemic control for adults with diabetes: <7.0  . Estimated average glucose 07/12/2013 200   Final  . Cholesterol, Total 07/12/2013 225* 100 - 199 mg/dL Final  . Triglycerides 07/12/2013 65  0 - 149 mg/dL Final  . HDL 07/12/2013 73  >39 mg/dL Final   Comment: According to ATP-III Guidelines, HDL-C >59 mg/dL is considered a                          negative risk factor for CHD.  [name] Kitchen VLDL Cholesterol Cal 07/12/2013 13  5 - 40 mg/dL Final  . LDL Calculated 07/12/2013 139* 0 - 99 mg/dL Final  . Chol/HDL Ratio 07/12/2013 3.1  0.0 - 4.4 ratio units Final   Comment:  T. Chol/HDL Ratio                                                                      Men  Women                                                        1/2 Avg.Risk  3.4    3.3                                                            Avg.Risk  5.0    4.4                                                         2X Avg.Risk  9.6    7.1                                                         3X Avg.Risk 23.4   11.0      Assessment/Plan  1. Type II or unspecified type diabetes mellitus without mention of complication, uncontrolled Lose weight. Follow diet. It is OK to use the Atkins Diet - Hemoglobin A1c; Future - CMP; Future  2. Unspecified essential hypertension Should respond to weight loss - CMP; Future  3. Other and unspecified hyperlipidemia Mild elevation in LDL, but runs a high HDL. No change in medications. - Lipid panel; Future  4. GERD (gastroesophageal reflux disease) Continue daily omeprazole

## 2013-07-19 ENCOUNTER — Ambulatory Visit (HOSPITAL_COMMUNITY)
Admission: RE | Admit: 2013-07-19 | Discharge: 2013-07-19 | Disposition: A | Discharge status: Home / Self Care | Payer: Medicare Other | Source: Ambulatory Visit | Attending: Internal Medicine | Admitting: Internal Medicine

## 2013-07-19 ENCOUNTER — Other Ambulatory Visit: Payer: Self-pay | Admitting: Internal Medicine

## 2013-07-19 DIAGNOSIS — Z1231 Encounter for screening mammogram for malignant neoplasm of breast: Secondary | ICD-10-CM | POA: Insufficient documentation

## 2013-07-23 DIAGNOSIS — E119 Type 2 diabetes mellitus without complications: Secondary | ICD-10-CM | POA: Diagnosis not present

## 2013-07-23 LAB — HM DIABETES EYE EXAM

## 2013-08-13 ENCOUNTER — Encounter: Payer: Self-pay | Admitting: Internal Medicine

## 2013-09-04 ENCOUNTER — Other Ambulatory Visit: Payer: Self-pay | Admitting: *Deleted

## 2013-09-04 MED ORDER — GLUCOSE BLOOD VI STRP: ORAL_STRIP | Status: DC

## 2013-09-04 NOTE — Telephone Encounter (Signed)
Patient Requested 

## 2013-10-04 ENCOUNTER — Other Ambulatory Visit: Payer: Self-pay | Admitting: Internal Medicine

## 2013-10-04 ENCOUNTER — Other Ambulatory Visit: Payer: Medicare Other

## 2013-10-04 DIAGNOSIS — E785 Hyperlipidemia, unspecified: Secondary | ICD-10-CM | POA: Diagnosis not present

## 2013-10-04 DIAGNOSIS — I10 Essential (primary) hypertension: Secondary | ICD-10-CM | POA: Diagnosis not present

## 2013-10-04 DIAGNOSIS — M25559 Pain in unspecified hip: Secondary | ICD-10-CM | POA: Diagnosis not present

## 2013-10-04 DIAGNOSIS — M545 Low back pain, unspecified: Secondary | ICD-10-CM | POA: Diagnosis not present

## 2013-10-04 DIAGNOSIS — IMO0001 Reserved for inherently not codable concepts without codable children: Secondary | ICD-10-CM

## 2013-10-04 DIAGNOSIS — E1165 Type 2 diabetes mellitus with hyperglycemia: Principal | ICD-10-CM

## 2013-10-04 DIAGNOSIS — M5430 Sciatica, unspecified side: Secondary | ICD-10-CM | POA: Diagnosis not present

## 2013-10-05 LAB — COMPREHENSIVE METABOLIC PANEL
ALK PHOS: 95 IU/L (ref 39–117)
ALT: 48 IU/L — AB (ref 0–32)
AST: 56 IU/L — AB (ref 0–40)
Albumin/Globulin Ratio: 1.5 (ref 1.1–2.5)
Albumin: 4.2 g/dL (ref 3.6–4.8)
BUN / CREAT RATIO: 26 (ref 11–26)
BUN: 19 mg/dL (ref 8–27)
CO2: 23 mmol/L (ref 18–29)
Calcium: 8.9 mg/dL (ref 8.7–10.3)
Chloride: 102 mmol/L (ref 97–108)
Creatinine, Ser: 0.73 mg/dL (ref 0.57–1.00)
GFR calc Af Amer: 99 mL/min/{1.73_m2} (ref >59)
GFR calc non Af Amer: 86 mL/min/{1.73_m2} (ref >59)
Globulin, Total: 2.8 g/dL (ref 1.5–4.5)
Glucose: 119 mg/dL — ABNORMAL HIGH (ref 65–99)
POTASSIUM: 4 mmol/L (ref 3.5–5.2)
SODIUM: 141 mmol/L (ref 134–144)
Total Bilirubin: 0.5 mg/dL (ref 0.0–1.2)
Total Protein: 7 g/dL (ref 6.0–8.5)

## 2013-10-05 LAB — LIPID PANEL
Chol/HDL Ratio: 3.2 ratio units (ref 0.0–4.4)
Cholesterol, Total: 228 mg/dL — ABNORMAL HIGH (ref 100–199)
HDL: 72 mg/dL (ref >39)
LDL Calculated: 138 mg/dL — ABNORMAL HIGH (ref 0–99)
Triglycerides: 89 mg/dL (ref 0–149)
VLDL Cholesterol Cal: 18 mg/dL (ref 5–40)

## 2013-10-05 LAB — HEMOGLOBIN A1C
Est. average glucose Bld gHb Est-mCnc: 131 mg/dL
HEMOGLOBIN A1C: 6.2 % — AB (ref 4.8–5.6)

## 2013-10-08 ENCOUNTER — Encounter: Payer: Self-pay | Admitting: Internal Medicine

## 2013-10-08 ENCOUNTER — Ambulatory Visit (INDEPENDENT_AMBULATORY_CARE_PROVIDER_SITE_OTHER): Payer: Medicare Other | Admitting: Internal Medicine

## 2013-10-08 VITALS — BP 146/82 | HR 74 | Wt 144.0 lb

## 2013-10-08 DIAGNOSIS — IMO0001 Reserved for inherently not codable concepts without codable children: Secondary | ICD-10-CM | POA: Diagnosis not present

## 2013-10-08 DIAGNOSIS — E785 Hyperlipidemia, unspecified: Secondary | ICD-10-CM | POA: Diagnosis not present

## 2013-10-08 DIAGNOSIS — E1165 Type 2 diabetes mellitus with hyperglycemia: Principal | ICD-10-CM

## 2013-10-08 DIAGNOSIS — K219 Gastro-esophageal reflux disease without esophagitis: Secondary | ICD-10-CM

## 2013-10-08 DIAGNOSIS — I10 Essential (primary) hypertension: Secondary | ICD-10-CM

## 2013-10-08 DIAGNOSIS — M62838 Other muscle spasm: Secondary | ICD-10-CM

## 2013-10-08 NOTE — Patient Instructions (Signed)
Continue medications as listed.

## 2013-10-08 NOTE — Progress Notes (Signed)
Patient ID: [name], female   DOB: 02-02-1946, 68 y.o.   MRN: [id number]    Location:    Place of Service:     Allergies  Allergen Reactions  . Latex Dermatitis  . Tdap [Diphth-Acell Pertussis-Tetanus]     Chief Complaint  Patient presents with  . Medical Management of Chronic Issues    blood pressure, blood sugar, cholesterol, review labs    HPI:  Saw Dr. Alfonso Ramus for back pain and left upper leg pain. Was given a brace for her back. Was given prednisone and hydrocodone, but she is not using eitheer medication. Had xray of the back that showed a lot of arthritis.  Type II or unspecified type diabetes mellitus without mention of complication, uncontrolled - dramatically improved A1c and weight loss on the Atkins Diet. Has some low glucose values, so she cut back on her insulin  GERD (gastroesophageal reflux disease): asymptomatic  Hyperlipidemia - controlled  Hypertension - controlled  Muscle spasm: legs. Better since using back brace.    Medications: Patient's Medications  New Prescriptions   No medications on file  Previous Medications   ASPIRIN 81 MG TABLET    Take 81 mg by mouth daily.     CALCIUM CARBONATE (OS-CAL) 600 MG TABS    Take 600 mg by mouth 2 (two) times daily with a meal.     CHOLECALCIFEROL (VITAMIN D) 1000 UNITS TABLET    Take 1,000 Units by mouth daily.     CINNAMON 500 MG TABS    Take 1,000 mg by mouth. Take 2 tablets daily   CIPROFLOXACIN (CIPRO) 250 MG TABLET    Take 250 mg by mouth 2 (two) times daily. Take one tablet after intercourse to prevent urinary tract infection   CYANOCOBALAMIN (VITAMIN B 12 PO)    Take 1,000 mcg by mouth daily.     FISH OIL-OMEGA-3 FATTY ACIDS 1000 MG CAPSULE    Take 1 g by mouth daily.     GLUCOSE BLOOD TEST STRIP    Use as instructed to test blood sugar three times daily  Dx  250.00   INSULIN GLARGINE (LANTUS) 100 UNIT/ML INJECTION    Inject 30 Units into the skin. Inject night at 10pm for blood sugar   LORATADINE (CLARITIN) 10 MG TABLET    Take 10 mg by mouth daily.     MELOXICAM (MOBIC) 7.5 MG TABLET    TAKE 1 TABLET BY MOUTH EVERY DAY FOR ARTHRITIS PAIN   METFORMIN (GLUCOPHAGE) 1000 MG TABLET    Take one tablet by mouth twice daily to treat diabetes   MULTIPLE VITAMINS-MINERALS (MULTIVITAMIN WITH MINERALS) TABLET    Take 1 tablet by mouth daily.     OMEPRAZOLE (PRILOSEC) 20 MG CAPSULE    TAKE ONE CAPSULE BY MOUTH EVERY DAY TO REDUCE STOMACH ACID  Modified Medications   No medications on file  Discontinued Medications   INSULIN DETEMIR (LEVEMIR) 100 UNIT/ML SOLN    Inject 40 Units into the skin daily at 10 pm.     Review of Systems  Constitutional:       Gained 2 pounds since last office visit in May 2014  HENT: Positive for hearing loss.   Eyes: Negative.   Respiratory: Negative.   Cardiovascular: Negative for chest pain, palpitations and leg swelling.  Gastrointestinal:       Occasional heartburn  Endocrine:       Diabetic  Genitourinary: Negative.   Musculoskeletal: Positive for arthralgias, back pain and myalgias. Negative  for gait problem and joint swelling.  Skin: Negative.   Neurological: Negative.   Hematological: Negative.   Psychiatric/Behavioral: Negative.     Filed Vitals:   10/08/13 1502  BP: 146/82  Pulse: 74  Weight: 144 lb (65.318 kg)  SpO2: 96%   Body mass index is 25.51 kg/(m^2).  Physical Exam  Constitutional: She is oriented to person, place, and time. She appears well-developed and well-nourished. No distress.  HENT:  Head: Normocephalic and atraumatic.  Nose: Nose normal.  Mild bilateral hearing loss.  Eyes: Conjunctivae and EOM are normal. Pupils are equal, round, and reactive to light.  Neck: Neck supple. No JVD present. No tracheal deviation present. No thyromegaly present.  Cardiovascular: Normal rate, regular rhythm, normal heart sounds and intact distal pulses.  Exam reveals no gallop and no friction rub.   No murmur  heard. Pulmonary/Chest: Effort normal and breath sounds normal. No respiratory distress. She has no wheezes. She has no rales.  Abdominal: Bowel sounds are normal. She exhibits no distension and no mass. There is no tenderness.  Musculoskeletal: Normal range of motion. She exhibits no edema and no tenderness.  Patient has evidence of  rupture long head of the biceps proximally.in the right mid forearm. It is not tender. Previous bruising is resolved. The muscle is balled up in the mid right forearm. Tender to percussion in the lower back.  Lymphadenopathy:    She has no cervical adenopathy.  Neurological: She is alert and oriented to person, place, and time. She has normal reflexes. No cranial nerve deficit. Coordination normal.  Skin: No rash noted. No erythema. No pallor.  Psychiatric: She has a normal mood and affect. Her behavior is normal. Thought content normal.     Labs reviewed: Appointment on 10/04/2013  Component Date Value Ref Range Status  . Hemoglobin A1C 10/04/2013 6.2* 4.8 - 5.6 % Final   Comment:          Increased risk for diabetes: 5.7 - 6.4                                   Diabetes: >6.4                                   Glycemic control for adults with diabetes: <7.0  . Estimated average glucose 10/04/2013 131   Final  . Glucose 10/04/2013 119* 65 - 99 mg/dL Final  . BUN 10/04/2013 19  8 - 27 mg/dL Final  . Creatinine, Ser 10/04/2013 0.73  0.57 - 1.00 mg/dL Final  . GFR calc non Af Amer 10/04/2013 86  >59 mL/min/1.73 Final  . GFR calc Af Amer 10/04/2013 99  >59 mL/min/1.73 Final  . BUN/Creatinine Ratio 10/04/2013 26  11 - 26 Final  . Sodium 10/04/2013 141  134 - 144 mmol/L Final  . Potassium 10/04/2013 4.0  3.5 - 5.2 mmol/L Final  . Chloride 10/04/2013 102  97 - 108 mmol/L Final  . CO2 10/04/2013 23  18 - 29 mmol/L Final  . Calcium 10/04/2013 8.9  8.7 - 10.3 mg/dL Final  . Total Protein 10/04/2013 7.0  6.0 - 8.5 g/dL Final  . Albumin 10/04/2013 4.2  3.6 - 4.8  g/dL Final  . Globulin, Total 10/04/2013 2.8  1.5 - 4.5 g/dL Final  . Albumin/Globulin Ratio 10/04/2013 1.5  1.1 - 2.5 Final  .  Total Bilirubin 10/04/2013 0.5  0.0 - 1.2 mg/dL Final  . Alkaline Phosphatase 10/04/2013 95  39 - 117 IU/L Final  . AST 10/04/2013 56* 0 - 40 IU/L Final  . ALT 10/04/2013 48* 0 - 32 IU/L Final  . Cholesterol, Total 10/04/2013 228* 100 - 199 mg/dL Final  . Triglycerides 10/04/2013 89  0 - 149 mg/dL Final  . HDL 10/04/2013 72  >39 mg/dL Final   Comment: According to ATP-III Guidelines, HDL-C >59 mg/dL is considered a                          negative risk factor for CHD.  [name] Kitchen VLDL Cholesterol Cal 10/04/2013 18  5 - 40 mg/dL Final  . LDL Calculated 10/04/2013 138* 0 - 99 mg/dL Final  . Chol/HDL Ratio 10/04/2013 3.2  0.0 - 4.4 ratio units Final   Comment:                                   T. Chol/HDL Ratio                                                                      Men  Women                                                        1/2 Avg.Risk  3.4    3.3                                                            Avg.Risk  5.0    4.4                                                         2X Avg.Risk  9.6    7.1                                                         3X Avg.Risk 23.4   11.0  Appointment on 07/12/2013  Component Date Value Ref Range Status  . Glucose 07/12/2013 96  65 - 99 mg/dL Final  . BUN 07/12/2013 12  8 - 27 mg/dL Final  . Creatinine, Ser 07/12/2013 0.63  0.57 - 1.00 mg/dL Final  . GFR calc non Af Amer 07/12/2013 94  >59 mL/min/1.73 Final  . GFR calc Af Amer 07/12/2013 108  >59 mL/min/1.73 Final  . BUN/Creatinine Ratio 07/12/2013 19  11 - 26  Final  . Sodium 07/12/2013 143  134 - 144 mmol/L Final  . Potassium 07/12/2013 4.2  3.5 - 5.2 mmol/L Final  . Chloride 07/12/2013 102  97 - 108 mmol/L Final  . CO2 07/12/2013 24  18 - 29 mmol/L Final  . Calcium 07/12/2013 9.4  8.7 - 10.3 mg/dL Final  . Hemoglobin A1C 07/12/2013 8.6* 4.8 - 5.6 %  Final   Comment:          Increased risk for diabetes: 5.7 - 6.4                                   Diabetes: >6.4                                   Glycemic control for adults with diabetes: <7.0  . Estimated average glucose 07/12/2013 200   Final  . Cholesterol, Total 07/12/2013 225* 100 - 199 mg/dL Final  . Triglycerides 07/12/2013 65  0 - 149 mg/dL Final  . HDL 07/12/2013 73  >39 mg/dL Final   Comment: According to ATP-III Guidelines, HDL-C >59 mg/dL is considered a                          negative risk factor for CHD.  [name] Kitchen VLDL Cholesterol Cal 07/12/2013 13  5 - 40 mg/dL Final  . LDL Calculated 07/12/2013 139* 0 - 99 mg/dL Final  . Chol/HDL Ratio 07/12/2013 3.1  0.0 - 4.4 ratio units Final   Comment:                                   T. Chol/HDL Ratio                                                                      Men  Women                                                        1/2 Avg.Risk  3.4    3.3                                                            Avg.Risk  5.0    4.4                                                         2X Avg.Risk  9.6    7.1  3X Avg.Risk 23.4   11.0      Assessment/Plan  1. Type II or unspecified type diabetes mellitus without mention of complication, uncontrolled - Hemoglobin A1c; Future - Comprehensive metabolic panel; Future  2. GERD (gastroesophageal reflux disease) asymptomatic  3. Hyperlipidemia - Lipid panel; Future  4. Hypertension - Comprehensive metabolic panel; Future  5. Muscle spasm Continue back brace

## 2013-10-17 ENCOUNTER — Encounter (HOSPITAL_COMMUNITY): Payer: Self-pay | Admitting: Emergency Medicine

## 2013-10-17 ENCOUNTER — Emergency Department (INDEPENDENT_AMBULATORY_CARE_PROVIDER_SITE_OTHER)
Admission: EM | Admit: 2013-10-17 | Discharge: 2013-10-17 | Disposition: A | Discharge status: Home / Self Care | Payer: Medicare Other | Source: Home / Self Care | Attending: Emergency Medicine | Admitting: Emergency Medicine

## 2013-10-17 DIAGNOSIS — R109 Unspecified abdominal pain: Secondary | ICD-10-CM | POA: Diagnosis not present

## 2013-10-17 DIAGNOSIS — K3189 Other diseases of stomach and duodenum: Secondary | ICD-10-CM | POA: Diagnosis not present

## 2013-10-17 DIAGNOSIS — R1013 Epigastric pain: Secondary | ICD-10-CM

## 2013-10-17 MED ORDER — GI COCKTAIL ~~LOC~~
ORAL | Status: AC
  Filled 2013-10-17: qty 30

## 2013-10-17 MED ORDER — GI COCKTAIL ~~LOC~~
30.0000 mL | Freq: Once | ORAL | Status: AC
  Administered 2013-10-17: 30 mL via ORAL

## 2013-10-17 NOTE — ED Notes (Signed)
C/o indigestion type discomfort epigastric area since earlier today , not completely relieved w Tums and diet Coke. Presents warm, clammy , pain "5" on 1-10 scale

## 2013-10-17 NOTE — Discharge Instructions (Signed)
Increase your omeprazole to 40 mg daily for one week Take Pepcid complete every 6 hours as needed for abdominal discomfort If worsening, go to the emergency department  Indigestion Indigestion is discomfort in the upper abdomen that is caused by underlying problems such as gastroesophageal reflux disease (GERD), ulcers, or gallbladder problems.  CAUSES  Indigestion can be caused by many things. Possible causes include:  Stomach acid in the esophagus.  Stomach infections, usually caused by the bacteria H. pylori.  Being overweight.  Hiatal hernia. This means part of the stomach pushes up through the diaphragm.  Overeating.  Emotional problems, such as stress, anxiety, or depression.  Poor nutrition.  Consuming too much alcohol, tobacco, or caffeine.  Consuming spicy foods, fats, peppermint, chocolate, tomato products, citrus, or fruit juices.  Medicines such as aspirin and other anti-inflammatory drugs, hormones, steroids, and thyroid medicines.  Gastroparesis. This is a condition in which the stomach does not empty properly.  Stomach cancer.  Pregnancy, due to an increase in hormone levels, a relaxation of muscles in the digestive tract, and pressure on the stomach from the growing fetus. SYMPTOMS   Uncomfortable feeling of fullness after eating.  Pain or burning sensation in the upper abdomen.  Bloating.  Belching and gas.  Nausea and vomiting.  Acidic taste in the mouth.  Burning sensation in the chest (heartburn). DIAGNOSIS  Your caregiver will review your medical history and perform a physical exam. Other tests, such as blood tests, stool tests, X-rays, and other imaging scans, may be done to check for more serious problems. TREATMENT  Liquid antacids and other drugs may be given to block stomach acid secretion. Medicines that increase esophageal muscle tone may also be given to help reduce symptoms. If an infection is found, antibiotic medicine may be  given. HOME CARE INSTRUCTIONS  Avoid foods and drinks that make your symptoms worse, such as:  Caffeine or alcoholic drinks.  Chocolate.  Peppermint or mint flavorings.  Garlic and onions.  Spicy foods.  Citrus fruits, such as oranges, lemons, or limes.  Tomato-based foods such as sauce, chili, salsa, and pizza.  Fried and fatty foods.  Avoid eating for the 3 hours prior to your bedtime.  Eat small, frequent meals instead of large meals.  Stop smoking if you smoke.  Maintain a healthy weight.  Wear loose-fitting clothing. Do not wear anything tight around your waist that causes pressure on your stomach.  Raise the head of your bed 4 to 8 inches with wood blocks to help you sleep. Extra pillows will not help.  Only take over-the-counter or prescription medicines as directed by your caregiver.  Do not take aspirin, ibuprofen, or other nonsteroidal anti-inflammatory drugs (NSAIDs). SEEK IMMEDIATE MEDICAL CARE IF:   You are not better after 2 days.  You have chest pressure or pain that radiates up into your neck, arms, back, jaw, or upper abdomen.  You have difficulty swallowing.  You keep vomiting.  You have black or bloody stools.  You have a fever.  You have dizziness, fainting, difficulty breathing, or heavy sweating.  You have severe abdominal pain.  You lose weight without trying. MAKE SURE YOU:  Understand these instructions.  Will watch your condition.  Will get help right away if you are not doing well or get worse. Document Released: 06/02/2004 Document Revised: 07/18/2011 Document Reviewed: 12/08/2010 [facility] Patient Information 2014 [place], [place].

## 2013-10-17 NOTE — ED Provider Notes (Signed)
CSN: [id number]     Arrival date & time 10/17/13  1730 History   First MD Initiated Contact with Patient 10/17/13 1745     Chief Complaint  Patient presents with  . Gastrophageal Reflux   (Consider location/radiation/quality/duration/timing/severity/associated sxs/prior Treatment) HPI Comments: 68 year old female with a history of type 2 diabetes, well controlled, as well as GERD, hypertension, hyperlipidemia presents for evaluation of epigastric abdominal pain. This started at 1:30 this afternoon, about 4-1/2 hours ago. The pain was initially about 5/10 in the epigastrium, radiating through to the back. The pain started after she ate smoothie. The pain has gradually gotten better and is now about 2/10 in severity. She has a history of GERD which she describes as always being mild to moderate reflux, never as indigestion. She specifically denies any chest pain, shortness of breath, nausea, vomiting, lightheadedness, dizziness, palpitations. She has no history of heart problems.  Patient is a 68 y.o. female presenting with GERD.  Gastrophageal Reflux Associated symptoms include abdominal pain (epigastric, with some "gas pains" all over stomach ). Pertinent negatives include no chest pain and no shortness of breath.    Past Medical History  Diagnosis Date  . Allergy   . Arthritis   . GERD (gastroesophageal reflux disease)   . Diabetes mellitus   . Epistaxis   . Tension headache   . Cramp of limb   . Neoplasm of uncertain behavior of skin   . Dysuria   . Nonspecific elevation of levels of transaminase or lactic acid dehydrogenase (LDH)   . Dysphagia, unspecified(787.20)   . Unspecified essential hypertension   . Pain in joint, shoulder region   . Lumbago   . Other and unspecified hyperlipidemia   . Osteoarthrosis, unspecified whether generalized or localized, unspecified site   . Chronic kidney disease, stage I   . Postablative ovarian failure    Past Surgical History  Procedure  Laterality Date  . Vaginal hysterectomy  1997    Dr Rande Lawman  . Colonoscopy    . Upper gastrointestinal endoscopy    . Tubal ligation  1982    Dr Connye Burkitt   Family History  Problem Relation Age of Onset  . Cancer Father   . Arthritis Sister   . Arthritis Brother   . Heart disease Maternal Grandmother   . Heart disease Maternal Grandfather   . Heart disease Paternal Grandmother   . Heart disease Paternal Grandfather    History  Substance Use Topics  . Smoking status: Never Smoker   . Smokeless tobacco: Never Used  . Alcohol Use: No   OB History   Grav Para Term Preterm Abortions TAB SAB Ect Mult Living                 Review of Systems  Constitutional: Negative for diaphoresis.  Eyes: Negative for visual disturbance.  Respiratory: Negative for chest tightness and shortness of breath.   Cardiovascular: Negative for chest pain.  Gastrointestinal: Positive for abdominal pain (epigastric, with some "gas pains" all over stomach ). Negative for nausea, vomiting and diarrhea.  Neurological: Negative for dizziness and light-headedness.  All other systems reviewed and are negative.   Allergies  Latex and Tdap  Home Medications   Prior to Admission medications   Medication Sig Start Date End Date Taking? Authorizing Provider  aspirin 81 MG tablet Take 81 mg by mouth daily.      Historical Provider, MD  calcium carbonate (OS-CAL) 600 MG TABS Take 600 mg by mouth 2 (two) times  daily with a meal.      Historical Provider, MD  cholecalciferol (VITAMIN D) 1000 UNITS tablet Take 1,000 Units by mouth daily.      Historical Provider, MD  Cinnamon 500 MG TABS Take 1,000 mg by mouth. Take 2 tablets daily    Historical Provider, MD  ciprofloxacin (CIPRO) 250 MG tablet Take 250 mg by mouth 2 (two) times daily. Take one tablet after intercourse to prevent urinary tract infection    Historical Provider, MD  Cyanocobalamin (VITAMIN B 12 PO) Take 1,000 mcg by mouth daily.      Historical Provider,  MD  fish oil-omega-3 fatty acids 1000 MG capsule Take 1 g by mouth daily.      Historical Provider, MD  glucose blood test strip Use as instructed to test blood sugar three times daily  Dx  250.00 09/04/13   Estill Dooms, MD  insulin glargine (LANTUS) 100 UNIT/ML injection Inject 30 Units into the skin. Inject night at 10pm for blood sugar    Historical Provider, MD  loratadine (CLARITIN) 10 MG tablet Take 10 mg by mouth daily.      Historical Provider, MD  meloxicam (MOBIC) 7.5 MG tablet TAKE 1 TABLET BY MOUTH EVERY DAY FOR ARTHRITIS PAIN 07/12/13   Estill Dooms, MD  metFORMIN (GLUCOPHAGE) 1000 MG tablet Take one tablet by mouth twice daily to treat diabetes 04/15/13   Gayland Curry, DO  Multiple Vitamins-Minerals (MULTIVITAMIN WITH MINERALS) tablet Take 1 tablet by mouth daily.      Historical Provider, MD  omeprazole (PRILOSEC) 20 MG capsule TAKE ONE CAPSULE BY MOUTH EVERY DAY TO REDUCE STOMACH ACID    Estill Dooms, MD   BP 159/61  Pulse 66  Temp(Src) 98.4 F (36.9 C) (Oral)  Resp 20  SpO2 96% Physical Exam  Nursing note and vitals reviewed. Constitutional: She is oriented to person, place, and time. Vital signs are normal. She appears well-developed and well-nourished. No distress.  HENT:  Head: Normocephalic and atraumatic.  Neck: Normal range of motion. Neck supple. No JVD present.  Cardiovascular: Normal rate, regular rhythm, S1 normal and S2 normal.  Exam reveals no gallop and no friction rub.   Murmur heard.  Systolic (holosystolic) murmur is present with a grade of 4/6  Pulses:      Dorsalis pedis pulses are 1+ on the right side, and 1+ on the left side.  No peripheral edema   Pulmonary/Chest: Effort normal and breath sounds normal. No respiratory distress. She has no wheezes. She has no rales. She exhibits no tenderness.  Abdominal: Normal appearance and bowel sounds are normal. There is no hepatosplenomegaly. There is tenderness (mild) in the epigastric area. There is no  rigidity, no rebound, no guarding, no tenderness at McBurney's point and negative Murphy's sign. No hernia.  Neurological: She is alert and oriented to person, place, and time. She has normal strength. Coordination normal.  Skin: Skin is warm and dry. No rash noted. She is not diaphoretic.  Psychiatric: She has a normal mood and affect. Judgment normal.    ED Course  Procedures (including critical care time) Labs Review Labs Reviewed - No data to display  Imaging Review No results found.   MDM   1. Abdominal pain   2. Dyspepsia    Near complete resolution of symptoms with GI cocktail  Increase omeprazole to twice a day for one week, take Pepcid complete to 6 hours as needed for the next couple of days. Frequent small  meals. The patient has thought about this may be she had and she did add flaxseed and protein powder for the first time, that may have caused some gastritis or bloating that would cause these symptoms. I have reviewed with her symptoms that would prompt return as well as symptoms that would prompt her to call 911 or go to the emergency department.    Liam Graham, PA-C 10/17/13 1849

## 2013-10-18 NOTE — ED Provider Notes (Signed)
Medical screening examination/treatment/procedure(s) were performed by non-physician practitioner and as supervising physician I was immediately available for consultation/collaboration.  Philipp Deputy, M.D.  Harden Mo, MD 10/18/13 2224

## 2013-12-10 ENCOUNTER — Emergency Department (INDEPENDENT_AMBULATORY_CARE_PROVIDER_SITE_OTHER)
Admission: EM | Admit: 2013-12-10 | Discharge: 2013-12-10 | Disposition: A | Discharge status: Home / Self Care | Payer: Medicare Other | Source: Home / Self Care | Attending: Emergency Medicine | Admitting: Emergency Medicine

## 2013-12-10 ENCOUNTER — Encounter (HOSPITAL_COMMUNITY): Payer: Self-pay | Admitting: Emergency Medicine

## 2013-12-10 DIAGNOSIS — J018 Other acute sinusitis: Secondary | ICD-10-CM

## 2013-12-10 MED ORDER — FLUTICASONE PROPIONATE 50 MCG/ACT NA SUSP: 2.0000 | Freq: Two times a day (BID) | NASAL | Status: DC

## 2013-12-10 MED ORDER — AMOXICILLIN-POT CLAVULANATE 875-125 MG PO TABS: 1.0000 | ORAL_TABLET | Freq: Two times a day (BID) | ORAL | Status: DC

## 2013-12-10 MED ORDER — CEFIXIME 400 MG PO TABS
ORAL_TABLET | ORAL | Status: AC
  Filled 2013-12-10: qty 1

## 2013-12-10 MED ORDER — CEFIXIME 400 MG PO TABS
400.0000 mg | ORAL_TABLET | Freq: Once | ORAL | Status: AC
  Administered 2013-12-10: 400 mg via ORAL

## 2013-12-10 NOTE — ED Provider Notes (Signed)
CSN: [id number]     Arrival date & time 12/10/13  1853 History   First MD Initiated Contact with Patient 12/10/13 1905     Chief Complaint  Patient presents with  . Facial Pain   (Consider location/radiation/quality/duration/timing/severity/associated sxs/prior Treatment) HPI Comments: 68 year old female presents for evaluation of possible sinus infection. For 3 days, she has progressively worsening sinus pressure and pain, nasal congestion, frontal headache, runny nose, postnasal drip, mild sore throat, mild ear pain, and dry cough. She also admits to chills, general fatigue and body aches. Over-the-counter medications are not helping. She denies chest pain or shortness of breath, fever.   Past Medical History  Diagnosis Date  . Allergy   . Arthritis   . GERD (gastroesophageal reflux disease)   . Diabetes mellitus   . Epistaxis   . Tension headache   . Cramp of limb   . Neoplasm of uncertain behavior of skin   . Dysuria   . Nonspecific elevation of levels of transaminase or lactic acid dehydrogenase (LDH)   . Dysphagia, unspecified(787.20)   . Unspecified essential hypertension   . Pain in joint, shoulder region   . Lumbago   . Other and unspecified hyperlipidemia   . Osteoarthrosis, unspecified whether generalized or localized, unspecified site   . Chronic kidney disease, stage I   . Postablative ovarian failure    Past Surgical History  Procedure Laterality Date  . Vaginal hysterectomy  1997    Dr Rande Lawman  . Colonoscopy    . Upper gastrointestinal endoscopy    . Tubal ligation  1982    Dr Connye Burkitt   Family History  Problem Relation Age of Onset  . Cancer Father   . Arthritis Sister   . Arthritis Brother   . Heart disease Maternal Grandmother   . Heart disease Maternal Grandfather   . Heart disease Paternal Grandmother   . Heart disease Paternal Grandfather    History  Substance Use Topics  . Smoking status: Never Smoker   . Smokeless tobacco: Never Used  . Alcohol  Use: No   OB History   Grav Para Term Preterm Abortions TAB SAB Ect Mult Living                 Review of Systems  Constitutional: Positive for chills and fatigue. Negative for fever.  HENT: Positive for congestion, ear pain, postnasal drip, rhinorrhea, sinus pressure and sore throat. Negative for ear discharge, nosebleeds and trouble swallowing.   Respiratory: Positive for cough. Negative for chest tightness, shortness of breath and wheezing.   Cardiovascular: Negative for chest pain.  Gastrointestinal: Negative for nausea, vomiting and diarrhea.  Musculoskeletal: Positive for arthralgias and myalgias.  All other systems reviewed and are negative.   Allergies  Latex and Tdap  Home Medications   Prior to Admission medications   Medication Sig Start Date End Date Taking? Authorizing Provider  aspirin 81 MG tablet Take 81 mg by mouth daily.     Yes Historical Provider, MD  calcium carbonate (OS-CAL) 600 MG TABS Take 600 mg by mouth 2 (two) times daily with a meal.     Yes Historical Provider, MD  cholecalciferol (VITAMIN D) 1000 UNITS tablet Take 1,000 Units by mouth daily.     Yes Historical Provider, MD  Cinnamon 500 MG TABS Take 1,000 mg by mouth. Take 2 tablets daily   Yes Historical Provider, MD  Cyanocobalamin (VITAMIN B 12 PO) Take 1,000 mcg by mouth daily.     Yes Historical Provider,  MD  fish oil-omega-3 fatty acids 1000 MG capsule Take 1 g by mouth daily.     Yes Historical Provider, MD  insulin glargine (LANTUS) 100 UNIT/ML injection Inject 30 Units into the skin. Inject night at 10pm for blood sugar   Yes Historical Provider, MD  loratadine (CLARITIN) 10 MG tablet Take 10 mg by mouth daily.     Yes Historical Provider, MD  meloxicam (MOBIC) 7.5 MG tablet TAKE 1 TABLET BY MOUTH EVERY DAY FOR ARTHRITIS PAIN 07/12/13  Yes Estill Dooms, MD  metFORMIN (GLUCOPHAGE) 1000 MG tablet Take one tablet by mouth twice daily to treat diabetes 04/15/13  Yes Tiffany L Reed, DO  Multiple  Vitamins-Minerals (MULTIVITAMIN WITH MINERALS) tablet Take 1 tablet by mouth daily.     Yes Historical Provider, MD  omeprazole (PRILOSEC) 20 MG capsule TAKE ONE CAPSULE BY MOUTH EVERY DAY TO REDUCE STOMACH ACID   Yes Estill Dooms, MD  amoxicillin-clavulanate (AUGMENTIN) 875-125 MG per tablet Take 1 tablet by mouth every 12 (twelve) hours. 12/10/13   Liam Graham, PA-C  ciprofloxacin (CIPRO) 250 MG tablet Take 250 mg by mouth 2 (two) times daily. Take one tablet after intercourse to prevent urinary tract infection    Historical Provider, MD  fluticasone (FLONASE) 50 MCG/ACT nasal spray Place 2 sprays into both nostrils 2 (two) times daily. Decrease to 2 sprays/nostril daily after 5 days 12/10/13   Liam Graham, PA-C  glucose blood test strip Use as instructed to test blood sugar three times daily  Dx  250.00 09/04/13   Estill Dooms, MD   BP 139/67  Pulse 76  Temp(Src) 98.5 F (36.9 C) (Oral)  Resp 12  SpO2 98% Physical Exam  Nursing note and vitals reviewed. Constitutional: She is oriented to person, place, and time. Vital signs are normal. She appears well-developed and well-nourished. No distress.  HENT:  Head: Normocephalic and atraumatic.  Right Ear: Tympanic membrane and ear canal normal.  Left Ear: Tympanic membrane and ear canal normal.  Nose: Mucosal edema and rhinorrhea present. Right sinus exhibits maxillary sinus tenderness and frontal sinus tenderness. Left sinus exhibits maxillary sinus tenderness and frontal sinus tenderness.  Mouth/Throat: Uvula is midline and mucous membranes are normal. Posterior oropharyngeal erythema present. No oropharyngeal exudate, posterior oropharyngeal edema or tonsillar abscesses.  Nasal mucosa exhibited exudate  Pulmonary/Chest: Effort normal. No respiratory distress.  Lymphadenopathy:       Head (right side): Tonsillar adenopathy present.       Head (left side): Tonsillar adenopathy present.    She has no cervical adenopathy.   Neurological: She is alert and oriented to person, place, and time. She has normal strength. Coordination normal.  Skin: Skin is warm and dry. No rash noted. She is not diaphoretic.  Psychiatric: She has a normal mood and affect. Judgment normal.    ED Course  Procedures (including critical care time) Labs Review Labs Reviewed - No data to display  Imaging Review No results found.   MDM   1. Other acute sinusitis    Given progressive worsening and level of discomfort, likely bacterial sinusitis. Giving a dose of Suprax here and discharge him with Augmentin and Flonase. Tylenol as needed for pain, avoid NSAIDs given recent history of gastritis. Followup if no improvement in a few days.   Meds ordered this encounter  Medications  . cefixime (SUPRAX) tablet 400 mg    Sig:   . amoxicillin-clavulanate (AUGMENTIN) 875-125 MG per tablet    Sig: Take  1 tablet by mouth every 12 (twelve) hours.    Dispense:  14 tablet    Refill:  0    Order Specific Question:  Supervising Provider    Answer:  Lynne Leader, [place]  . fluticasone (FLONASE) 50 MCG/ACT nasal spray    Sig: Place 2 sprays into both nostrils 2 (two) times daily. Decrease to 2 sprays/nostril daily after 5 days    Dispense:  16 g    Refill:  2    Order Specific Question:  Supervising Provider    Answer:  Lynne Leader, [place]       Liam Graham, PA-C 12/10/13 1931

## 2013-12-10 NOTE — ED Provider Notes (Signed)
Medical screening examination/treatment/procedure(s) were performed by resident physician or non-physician practitioner and as supervising physician I was immediately available for consultation/collaboration.  Maryruth Eve, MD     Melony Overly, MD 12/10/13 2029

## 2013-12-10 NOTE — Discharge Instructions (Signed)

## 2013-12-10 NOTE — ED Notes (Signed)
C/o  Sinus pressure and pain.  Head congestion.  Headache.  Runny nose.  Post nasal drip.  Since Sunday.  No relief with otc meds.

## 2013-12-23 ENCOUNTER — Other Ambulatory Visit: Payer: Self-pay | Admitting: Sports Medicine

## 2013-12-23 DIAGNOSIS — M545 Low back pain, unspecified: Secondary | ICD-10-CM

## 2014-01-01 ENCOUNTER — Ambulatory Visit
Admission: RE | Admit: 2014-01-01 | Discharge: 2014-01-01 | Disposition: A | Discharge status: Home / Self Care | Payer: Medicare Other | Source: Ambulatory Visit | Attending: Sports Medicine | Admitting: Sports Medicine

## 2014-01-01 DIAGNOSIS — M545 Low back pain, unspecified: Secondary | ICD-10-CM

## 2014-01-01 DIAGNOSIS — M47817 Spondylosis without myelopathy or radiculopathy, lumbosacral region: Secondary | ICD-10-CM | POA: Diagnosis not present

## 2014-01-01 DIAGNOSIS — M48061 Spinal stenosis, lumbar region without neurogenic claudication: Secondary | ICD-10-CM | POA: Diagnosis not present

## 2014-01-09 ENCOUNTER — Other Ambulatory Visit: Payer: Self-pay | Admitting: Internal Medicine

## 2014-01-16 ENCOUNTER — Encounter: Payer: Self-pay | Admitting: Nurse Practitioner

## 2014-01-16 ENCOUNTER — Ambulatory Visit (INDEPENDENT_AMBULATORY_CARE_PROVIDER_SITE_OTHER): Payer: Medicare Other | Admitting: Nurse Practitioner

## 2014-01-16 VITALS — BP 114/68 | HR 74 | Temp 98.7°F | Wt 143.0 lb

## 2014-01-16 DIAGNOSIS — Z23 Encounter for immunization: Secondary | ICD-10-CM

## 2014-01-16 DIAGNOSIS — I10 Essential (primary) hypertension: Secondary | ICD-10-CM

## 2014-01-16 NOTE — Progress Notes (Signed)
Patient ID: [name], female   DOB: 01-May-1946, 69 y.o.   MRN: [id number]    Allergies  Allergen Reactions  . Latex Dermatitis  . Tdap [Diphth-Acell Pertussis-Tetanus]     Chief Complaint  Patient presents with  . Follow-up    B/P follow-up     HPI: Patient is a 68 y.o. female seen in the office today for blood pressure follow up. Not taking any medication for blood pressure. Had a few high readings in office and was requested to come back for bp check. Reports home blood pressures are below 120/80.   Review of Systems:  Review of Systems  Eyes: Negative.   Respiratory: Negative for shortness of breath.   Cardiovascular: Negative for chest pain, palpitations and leg swelling.  Musculoskeletal: Positive for arthralgias, back pain and myalgias. Negative for gait problem and joint swelling.  Skin: Negative.   Neurological: Negative for dizziness, weakness, light-headedness and headaches.     Past Medical History  Diagnosis Date  . Allergy   . Arthritis   . GERD (gastroesophageal reflux disease)   . Diabetes mellitus   . Epistaxis   . Tension headache   . Cramp of limb   . Neoplasm of uncertain behavior of skin   . Dysuria   . Nonspecific elevation of levels of transaminase or lactic acid dehydrogenase (LDH)   . Dysphagia, unspecified(787.20)   . Unspecified essential hypertension   . Pain in joint, shoulder region   . Lumbago   . Other and unspecified hyperlipidemia   . Osteoarthrosis, unspecified whether generalized or localized, unspecified site   . Chronic kidney disease, stage I   . Postablative ovarian failure    Past Surgical History  Procedure Laterality Date  . Vaginal hysterectomy  1997    Dr Rande Lawman  . Colonoscopy    . Upper gastrointestinal endoscopy    . Tubal ligation  73    Dr Connye Burkitt   Social History:   reports that she has never smoked. She has never used smokeless tobacco. She reports that she does not drink alcohol or use illicit  drugs.  Family History  Problem Relation Age of Onset  . Cancer Father   . Arthritis Sister   . Arthritis Brother   . Heart disease Maternal Grandmother   . Heart disease Maternal Grandfather   . Heart disease Paternal Grandmother   . Heart disease Paternal Grandfather     Medications: Patient's Medications  New Prescriptions   No medications on file  Previous Medications   CALCIUM CARBONATE (OS-CAL) 600 MG TABS    Take 600 mg by mouth 2 (two) times daily with a meal.     CHOLECALCIFEROL (VITAMIN D) 1000 UNITS TABLET    Take 1,000 Units by mouth daily.     CINNAMON 500 MG TABS    Take 1,000 mg by mouth. Take 2 tablets daily   CIPROFLOXACIN (CIPRO) 250 MG TABLET    Take 250 mg by mouth 2 (two) times daily. Take one tablet after intercourse to prevent urinary tract infection   CYANOCOBALAMIN (VITAMIN B 12 PO)    Take 1,000 mcg by mouth daily.     FISH OIL-OMEGA-3 FATTY ACIDS 1000 MG CAPSULE    Take 1 g by mouth daily.     FLUTICASONE (FLONASE) 50 MCG/ACT NASAL SPRAY    Place 2 sprays into both nostrils 2 (two) times daily. Decrease to 2 sprays/nostril daily after 5 days   GLUCOSE BLOOD TEST STRIP    Use  as instructed to test blood sugar three times daily  Dx  250.00   INSULIN GLARGINE (LANTUS) 100 UNIT/ML INJECTION    Inject 30 Units into the skin. Inject night at 10pm for blood sugar   LORATADINE (CLARITIN) 10 MG TABLET    Take 10 mg by mouth daily.     MELOXICAM (MOBIC) 7.5 MG TABLET    TAKE 1 TABLET BY MOUTH EVERY DAY FOR ARTHRITIS PAIN   METFORMIN (GLUCOPHAGE) 1000 MG TABLET    Take one tablet by mouth twice daily to treat diabetes   MULTIPLE VITAMINS-MINERALS (MULTIVITAMIN WITH MINERALS) TABLET    Take 1 tablet by mouth daily.     OMEPRAZOLE (PRILOSEC) 20 MG CAPSULE    TAKE ONE CAPSULE BY MOUTH EVERY DAY TO REDUCE STOMACH ACID  Modified Medications   No medications on file  Discontinued Medications   AMOXICILLIN-CLAVULANATE (AUGMENTIN) 875-125 MG PER TABLET    Take 1 tablet by  mouth every 12 (twelve) hours.   ASPIRIN 81 MG TABLET    Take 81 mg by mouth daily.       Physical Exam:  Filed Vitals:   01/16/14 1545  BP: 114/68  Pulse: 74  Temp: 98.7 F (37.1 C)  TempSrc: Oral  Weight: 143 lb (64.864 kg)  SpO2: 94%    Physical Exam  Constitutional: She is oriented to person, place, and time and well-developed, well-nourished, and in no distress.  Cardiovascular: Normal rate, regular rhythm and normal heart sounds.   Pulmonary/Chest: Effort normal and breath sounds normal. No respiratory distress.  Musculoskeletal: She exhibits no edema.  Neurological: She is alert and oriented to person, place, and time.  Skin: Skin is warm and dry.    Labs reviewed: Basic Metabolic Panel:  Recent Labs  03/25/13 0823 07/12/13 0809 10/04/13 0829  NA 142 143 141  K 4.1 4.2 4.0  CL 101 102 102  CO2 25 24 23   GLUCOSE 109* 96 119*  BUN 11 12 19   CREATININE 0.58 0.63 0.73  CALCIUM 9.7 9.4 8.9   Liver Function Tests:  Recent Labs  10/04/13 0829  AST 56*  ALT 48*  ALKPHOS 95  BILITOT 0.5  PROT 7.0   No results found for this basename: LIPASE, AMYLASE,  in the last 8760 hours No results found for this basename: AMMONIA,  in the last 8760 hours CBC: No results found for this basename: WBC, NEUTROABS, HGB, HCT, MCV, PLT,  in the last 8760 hours Lipid Panel:  Recent Labs  03/25/13 0823 07/12/13 0809 10/04/13 0829  HDL 69 73 72  LDLCALC 138* 139* 138*  TRIG 89 65 89  CHOLHDL 3.3 3.1 3.2   TSH: No results found for this basename: TSH,  in the last 8760 hours A1C: Lab Results  Component Value Date   HGBA1C 6.2* 10/04/2013     Assessment/Plan 1. Need for prophylactic vaccination and inoculation against influenza -flu vaccine given  2. Hypertension -bp at goal, cont lifestyle modifications

## 2014-01-17 DIAGNOSIS — M48061 Spinal stenosis, lumbar region without neurogenic claudication: Secondary | ICD-10-CM | POA: Diagnosis not present

## 2014-01-17 DIAGNOSIS — IMO0002 Reserved for concepts with insufficient information to code with codable children: Secondary | ICD-10-CM | POA: Diagnosis not present

## 2014-01-17 DIAGNOSIS — M545 Low back pain, unspecified: Secondary | ICD-10-CM | POA: Diagnosis not present

## 2014-01-17 DIAGNOSIS — M25559 Pain in unspecified hip: Secondary | ICD-10-CM | POA: Diagnosis not present

## 2014-01-29 DIAGNOSIS — M545 Low back pain, unspecified: Secondary | ICD-10-CM | POA: Diagnosis not present

## 2014-01-31 DIAGNOSIS — IMO0002 Reserved for concepts with insufficient information to code with codable children: Secondary | ICD-10-CM | POA: Diagnosis not present

## 2014-01-31 DIAGNOSIS — M545 Low back pain, unspecified: Secondary | ICD-10-CM | POA: Diagnosis not present

## 2014-02-13 ENCOUNTER — Other Ambulatory Visit: Payer: Medicare Other

## 2014-02-13 DIAGNOSIS — E1165 Type 2 diabetes mellitus with hyperglycemia: Secondary | ICD-10-CM

## 2014-02-13 DIAGNOSIS — IMO0002 Reserved for concepts with insufficient information to code with codable children: Secondary | ICD-10-CM

## 2014-02-13 DIAGNOSIS — I10 Essential (primary) hypertension: Secondary | ICD-10-CM

## 2014-02-13 DIAGNOSIS — E785 Hyperlipidemia, unspecified: Secondary | ICD-10-CM

## 2014-02-14 LAB — COMPREHENSIVE METABOLIC PANEL
ALBUMIN: 3.8 g/dL (ref 3.6–4.8)
ALT: 52 IU/L — ABNORMAL HIGH (ref 0–32)
AST: 47 IU/L — ABNORMAL HIGH (ref 0–40)
Albumin/Globulin Ratio: 1.3 (ref 1.1–2.5)
Alkaline Phosphatase: 92 IU/L (ref 39–117)
BILIRUBIN TOTAL: 0.7 mg/dL (ref 0.0–1.2)
BUN/Creatinine Ratio: 26 (ref 11–26)
BUN: 17 mg/dL (ref 8–27)
CHLORIDE: 102 mmol/L (ref 97–108)
CO2: 25 mmol/L (ref 18–29)
CREATININE: 0.65 mg/dL (ref 0.57–1.00)
Calcium: 9.5 mg/dL (ref 8.7–10.3)
GFR calc non Af Amer: 92 mL/min/{1.73_m2} (ref >59)
GFR, EST AFRICAN AMERICAN: 106 mL/min/{1.73_m2} (ref >59)
GLOBULIN, TOTAL: 3 g/dL (ref 1.5–4.5)
Glucose: 87 mg/dL (ref 65–99)
Potassium: 4 mmol/L (ref 3.5–5.2)
Sodium: 143 mmol/L (ref 134–144)
Total Protein: 6.8 g/dL (ref 6.0–8.5)

## 2014-02-14 LAB — LIPID PANEL
Chol/HDL Ratio: 2.8 ratio units (ref 0.0–4.4)
Cholesterol, Total: 245 mg/dL — ABNORMAL HIGH (ref 100–199)
HDL: 87 mg/dL (ref >39)
LDL CALC: 144 mg/dL — AB (ref 0–99)
Triglycerides: 71 mg/dL (ref 0–149)
VLDL CHOLESTEROL CAL: 14 mg/dL (ref 5–40)

## 2014-02-14 LAB — HEMOGLOBIN A1C
Est. average glucose Bld gHb Est-mCnc: 148 mg/dL
Hgb A1c MFr Bld: 6.8 % — ABNORMAL HIGH (ref 4.8–5.6)

## 2014-02-17 DIAGNOSIS — M545 Low back pain: Secondary | ICD-10-CM | POA: Diagnosis not present

## 2014-02-18 ENCOUNTER — Encounter: Payer: Self-pay | Admitting: Internal Medicine

## 2014-02-18 ENCOUNTER — Ambulatory Visit (INDEPENDENT_AMBULATORY_CARE_PROVIDER_SITE_OTHER): Payer: Medicare Other | Admitting: Internal Medicine

## 2014-02-18 VITALS — BP 140/86 | HR 73 | Temp 98.2°F | Resp 18 | Ht 63.0 in | Wt 145.8 lb

## 2014-02-18 DIAGNOSIS — E785 Hyperlipidemia, unspecified: Secondary | ICD-10-CM

## 2014-02-18 DIAGNOSIS — I10 Essential (primary) hypertension: Secondary | ICD-10-CM

## 2014-02-18 DIAGNOSIS — E119 Type 2 diabetes mellitus without complications: Secondary | ICD-10-CM

## 2014-02-18 NOTE — Progress Notes (Signed)
Patient ID: [name], female   DOB: 1945/11/24, 68 y.o.   MRN: [id number]    Facility  [facility]    Place of Service:   OFFICE   Allergies  Allergen Reactions  . Latex Dermatitis  . Tdap [Diphth-Acell Pertussis-Tetanus]     Chief Complaint  Patient presents with  . Medical Management of Chronic Issues    HPI:  Diabetes mellitus type 2, controlled - controlled  Essential hypertension: controlled  Hyperlipemia - controlled    Medications: Patient's Medications  New Prescriptions   No medications on file  Previous Medications   CALCIUM CARBONATE (OS-CAL) 600 MG TABS    Take 600 mg by mouth 2 (two) times daily with a meal.     CHOLECALCIFEROL (VITAMIN D) 1000 UNITS TABLET    Take 1,000 Units by mouth daily.     CINNAMON 500 MG TABS    Take 1,000 mg by mouth. Take 2 tablets daily   CIPROFLOXACIN (CIPRO) 250 MG TABLET    Take 250 mg by mouth 2 (two) times daily. Take one tablet after intercourse to prevent urinary tract infection   CYANOCOBALAMIN (VITAMIN B 12 PO)    Take 1,000 mcg by mouth daily.     FISH OIL-OMEGA-3 FATTY ACIDS 1000 MG CAPSULE    Take 1 g by mouth daily.     FLUTICASONE (FLONASE) 50 MCG/ACT NASAL SPRAY    Place 2 sprays into both nostrils 2 (two) times daily. Decrease to 2 sprays/nostril daily after 5 days   GLUCOSE BLOOD TEST STRIP    Use as instructed to test blood sugar three times daily  Dx  250.00   INSULIN GLARGINE (LANTUS) 100 UNIT/ML INJECTION    Inject 30 Units into the skin. Inject night at 10pm for blood sugar   LORATADINE (CLARITIN) 10 MG TABLET    Take 10 mg by mouth daily.     MELOXICAM (MOBIC) 7.5 MG TABLET    TAKE 1 TABLET BY MOUTH EVERY DAY FOR ARTHRITIS PAIN   METFORMIN (GLUCOPHAGE) 1000 MG TABLET    Take one tablet by mouth twice daily to treat diabetes   MULTIPLE VITAMINS-MINERALS (MULTIVITAMIN WITH MINERALS) TABLET    Take 1 tablet by mouth daily.     OMEPRAZOLE (PRILOSEC) 20 MG CAPSULE    TAKE ONE CAPSULE BY MOUTH EVERY DAY TO REDUCE  STOMACH ACID  Modified Medications   No medications on file  Discontinued Medications   No medications on file     Review of Systems  Constitutional: Negative.   HENT: Positive for hearing loss and postnasal drip.   Eyes: Negative.   Respiratory: Negative.   Cardiovascular: Negative for chest pain, palpitations and leg swelling.  Gastrointestinal:       Occasional heartburn  Endocrine:       Diabetic  Musculoskeletal: Positive for arthralgias, back pain and myalgias. Negative for gait problem and joint swelling.  Skin: Negative.   Neurological: Negative.   Hematological: Negative.   Psychiatric/Behavioral: Negative.     Filed Vitals:   02/18/14 1458  BP: 140/86  Pulse: 73  Temp: 98.2 F (36.8 C)  TempSrc: Oral  Resp: 18  Height: 5' 3"  (1.6 m)  Weight: 145 lb 12.8 oz (66.134 kg)  SpO2: 99%   Body mass index is 25.83 kg/(m^2).  Physical Exam  Constitutional: She is oriented to person, place, and time. She appears well-developed and well-nourished. No distress.  HENT:  Head: Normocephalic and atraumatic.  Nose: Nose normal.  Mild bilateral hearing  loss.  Eyes: Conjunctivae and EOM are normal. Pupils are equal, round, and reactive to light.  Neck: Neck supple. No JVD present. No tracheal deviation present. No thyromegaly present.  Cardiovascular: Normal rate, regular rhythm, normal heart sounds and intact distal pulses.  Exam reveals no gallop and no friction rub.   No murmur heard. Pulmonary/Chest: Effort normal and breath sounds normal. No respiratory distress. She has no wheezes. She has no rales.  Abdominal: Bowel sounds are normal. She exhibits no distension and no mass. There is no tenderness.  Musculoskeletal: Normal range of motion. She exhibits no edema and no tenderness.  Patient has evidence of  rupture long head of the biceps proximally.in the right mid forearm. It is not tender. Previous bruising is resolved. The muscle is balled up in the mid right  forearm. Tender to percussion in the lower back.  Lymphadenopathy:    She has no cervical adenopathy.  Neurological: She is alert and oriented to person, place, and time. She has normal reflexes. No cranial nerve deficit. Coordination normal.  Skin: No rash noted. No erythema. No pallor.  Psychiatric: She has a normal mood and affect. Her behavior is normal. Thought content normal.     Labs reviewed: Appointment on 02/13/2014  Component Date Value Ref Range Status  . Hemoglobin A1C 02/13/2014 6.8* 4.8 - 5.6 % Final   Comment:          Increased risk for diabetes: 5.7 - 6.4                                   Diabetes: >6.4                                   Glycemic control for adults with diabetes: <7.0  . Estimated average glucose 02/13/2014 148   Final  . Glucose 02/13/2014 87  65 - 99 mg/dL Final  . BUN 02/13/2014 17  8 - 27 mg/dL Final  . Creatinine, Ser 02/13/2014 0.65  0.57 - 1.00 mg/dL Final  . GFR calc non Af Amer 02/13/2014 92  >59 mL/min/1.73 Final  . GFR calc Af Amer 02/13/2014 106  >59 mL/min/1.73 Final  . BUN/Creatinine Ratio 02/13/2014 26  11 - 26 Final  . Sodium 02/13/2014 143  134 - 144 mmol/L Final  . Potassium 02/13/2014 4.0  3.5 - 5.2 mmol/L Final  . Chloride 02/13/2014 102  97 - 108 mmol/L Final  . CO2 02/13/2014 25  18 - 29 mmol/L Final  . Calcium 02/13/2014 9.5  8.7 - 10.3 mg/dL Final  . Total Protein 02/13/2014 6.8  6.0 - 8.5 g/dL Final  . Albumin 02/13/2014 3.8  3.6 - 4.8 g/dL Final  . Globulin, Total 02/13/2014 3.0  1.5 - 4.5 g/dL Final  . Albumin/Globulin Ratio 02/13/2014 1.3  1.1 - 2.5 Final  . Total Bilirubin 02/13/2014 0.7  0.0 - 1.2 mg/dL Final  . Alkaline Phosphatase 02/13/2014 92  39 - 117 IU/L Final  . AST 02/13/2014 47* 0 - 40 IU/L Final  . ALT 02/13/2014 52* 0 - 32 IU/L Final  . Cholesterol, Total 02/13/2014 245* 100 - 199 mg/dL Final  . Triglycerides 02/13/2014 71  0 - 149 mg/dL Final  . HDL 02/13/2014 87  >39 mg/dL Final   Comment: According  to ATP-III Guidelines, HDL-C >59 mg/dL is considered a  negative risk factor for CHD.  [name] Kitchen VLDL Cholesterol Cal 02/13/2014 14  5 - 40 mg/dL Final  . LDL Calculated 02/13/2014 144* 0 - 99 mg/dL Final  . Chol/HDL Ratio 02/13/2014 2.8  0.0 - 4.4 ratio units Final   Comment:                                   T. Chol/HDL Ratio                                                                      Men  Women                                                        1/2 Avg.Risk  3.4    3.3                                                            Avg.Risk  5.0    4.4                                                         2X Avg.Risk  9.6    7.1                                                         3X Avg.Risk 23.4   11.0     Assessment/Plan  1. Diabetes mellitus type 2, controlled - Hemoglobin A1c; Future - Basic metabolic panel; Future - Microalbumin, urine; Future  2. Essential hypertension controlled  3. Hyperlipemia Controlled; runs high HDL - Lipid panel; Future

## 2014-03-28 ENCOUNTER — Other Ambulatory Visit: Payer: Self-pay | Admitting: *Deleted

## 2014-03-28 MED ORDER — INSULIN GLARGINE 100 UNIT/ML ~~LOC~~ SOLN: 30.0000 [IU] | Freq: Every day | SUBCUTANEOUS | Status: DC

## 2014-04-21 ENCOUNTER — Other Ambulatory Visit: Payer: Self-pay | Admitting: Internal Medicine

## 2014-04-22 ENCOUNTER — Other Ambulatory Visit: Payer: Self-pay | Admitting: *Deleted

## 2014-04-22 ENCOUNTER — Encounter: Payer: Self-pay | Admitting: *Deleted

## 2014-04-22 MED ORDER — INSULIN SYRINGES (DISPOSABLE) U-100 1 ML MISC
Status: DC
Start: 2014-04-22 — End: 2015-03-31

## 2014-04-22 NOTE — Telephone Encounter (Signed)
Patient called and requested to be faxed to pharmacy

## 2014-06-13 ENCOUNTER — Other Ambulatory Visit: Payer: Self-pay | Admitting: Internal Medicine

## 2014-06-13 DIAGNOSIS — Z1231 Encounter for screening mammogram for malignant neoplasm of breast: Secondary | ICD-10-CM

## 2014-06-23 ENCOUNTER — Other Ambulatory Visit: Payer: Medicare Other

## 2014-06-23 DIAGNOSIS — E119 Type 2 diabetes mellitus without complications: Secondary | ICD-10-CM

## 2014-06-23 DIAGNOSIS — E785 Hyperlipidemia, unspecified: Secondary | ICD-10-CM | POA: Diagnosis not present

## 2014-06-23 DIAGNOSIS — L659 Nonscarring hair loss, unspecified: Secondary | ICD-10-CM | POA: Diagnosis not present

## 2014-06-23 DIAGNOSIS — L299 Pruritus, unspecified: Secondary | ICD-10-CM | POA: Diagnosis not present

## 2014-06-24 LAB — LIPID PANEL
CHOLESTEROL TOTAL: 255 mg/dL — AB (ref 100–199)
Chol/HDL Ratio: 3.1 ratio units (ref 0.0–4.4)
HDL: 81 mg/dL (ref >39)
LDL Calculated: 160 mg/dL — ABNORMAL HIGH (ref 0–99)
TRIGLYCERIDES: 68 mg/dL (ref 0–149)
VLDL Cholesterol Cal: 14 mg/dL (ref 5–40)

## 2014-06-24 LAB — BASIC METABOLIC PANEL
BUN / CREAT RATIO: 25 (ref 11–26)
BUN: 16 mg/dL (ref 8–27)
CALCIUM: 9.4 mg/dL (ref 8.7–10.3)
CO2: 23 mmol/L (ref 18–29)
CREATININE: 0.64 mg/dL (ref 0.57–1.00)
Chloride: 103 mmol/L (ref 97–108)
GFR calc Af Amer: 107 mL/min/{1.73_m2} (ref >59)
GFR calc non Af Amer: 93 mL/min/{1.73_m2} (ref >59)
Glucose: 73 mg/dL (ref 65–99)
Potassium: 4 mmol/L (ref 3.5–5.2)
Sodium: 143 mmol/L (ref 134–144)

## 2014-06-24 LAB — MICROALBUMIN, URINE: Microalbumin, Urine: <3 ug/mL (ref 0.0–17.0)

## 2014-06-24 LAB — HEMOGLOBIN A1C
ESTIMATED AVERAGE GLUCOSE: 146 mg/dL
Hgb A1c MFr Bld: 6.7 % — ABNORMAL HIGH (ref 4.8–5.6)

## 2014-06-25 ENCOUNTER — Ambulatory Visit (INDEPENDENT_AMBULATORY_CARE_PROVIDER_SITE_OTHER): Payer: Medicare Other | Admitting: Internal Medicine

## 2014-06-25 ENCOUNTER — Encounter: Payer: Self-pay | Admitting: Internal Medicine

## 2014-06-25 VITALS — BP 132/72 | HR 78 | Temp 98.1°F | Ht 63.0 in | Wt 145.2 lb

## 2014-06-25 DIAGNOSIS — L299 Pruritus, unspecified: Secondary | ICD-10-CM | POA: Diagnosis not present

## 2014-06-25 DIAGNOSIS — H9190 Unspecified hearing loss, unspecified ear: Secondary | ICD-10-CM | POA: Insufficient documentation

## 2014-06-25 DIAGNOSIS — I10 Essential (primary) hypertension: Secondary | ICD-10-CM

## 2014-06-25 DIAGNOSIS — L659 Nonscarring hair loss, unspecified: Secondary | ICD-10-CM | POA: Diagnosis not present

## 2014-06-25 DIAGNOSIS — G4700 Insomnia, unspecified: Secondary | ICD-10-CM | POA: Diagnosis not present

## 2014-06-25 DIAGNOSIS — E119 Type 2 diabetes mellitus without complications: Secondary | ICD-10-CM

## 2014-06-25 DIAGNOSIS — E785 Hyperlipidemia, unspecified: Secondary | ICD-10-CM | POA: Diagnosis not present

## 2014-06-25 MED ORDER — MINOXIDIL 5 % EX FOAM: CUTANEOUS | Status: DC

## 2014-06-25 NOTE — Progress Notes (Signed)
Patient ID: [name], female   DOB: 15-Nov-1945, 69 y.o.   MRN: [id number]    Facility  [facility]    Place of Service:   OFFICE   Allergies  Allergen Reactions  . Latex Dermatitis  . Tdap [Diphth-Acell Pertussis-Tetanus]     Chief Complaint  Patient presents with  . Medical Management of Chronic Issues    4 month follow up    HPI:  Diabetes mellitus type 2, controlled: Recent labs suggest reviewed control with hemoglobin A1c at 6.7.  Essential hypertension: Controlled  Hyperlipemia: No total cholesterol and LDL had risen since her last lab. She is now up to 255 total and 160 on the LDL. Her HDL is 81 which helps protect.  Not sleeping well. Only gets 5 hours or less per night. Occurring for a little more than a year.Has trouble going to sleep and staying asleep. If she has to urinate, she does not get back to sleep. Does not nap during the day.  Head is itching since about 6 months.  Hearing loss. Would like to see audiologist. Seems a litlle worse in the left ear.  Hair thinning for last year. Has tried Folicure without improvement.   Medications: Patient's Medications  New Prescriptions   No medications on file  Previous Medications   CALCIUM CARBONATE (OS-CAL) 600 MG TABS    Take 600 mg by mouth 2 (two) times daily with a meal.     CHOLECALCIFEROL (VITAMIN D) 1000 UNITS TABLET    Take 1,000 Units by mouth daily.     CINNAMON 500 MG TABS    Take 1,000 mg by mouth. Take 2 tablets daily   CIPROFLOXACIN (CIPRO) 250 MG TABLET    Take 250 mg by mouth 2 (two) times daily. Take one tablet after intercourse to prevent urinary tract infection   CYANOCOBALAMIN (VITAMIN B 12 PO)    Take 1,000 mcg by mouth daily.     FISH OIL-OMEGA-3 FATTY ACIDS 1000 MG CAPSULE    Take 1 g by mouth daily.     FLUTICASONE (FLONASE) 50 MCG/ACT NASAL SPRAY    Place 2 sprays into both nostrils 2 (two) times daily. Decrease to 2 sprays/nostril daily after 5 days   GLUCOSE BLOOD TEST STRIP    Use as  instructed to test blood sugar three times daily  Dx  250.00   INSULIN GLARGINE (LANTUS) 100 UNIT/ML INJECTION    Inject 0.3 mLs (30 Units total) into the skin daily. Inject night at 10pm for blood sugar   INSULIN SYRINGES, DISPOSABLE, U-100 1 ML MISC    Use with injecting insulin. Dx: E11.9   LORATADINE (CLARITIN) 10 MG TABLET    Take 10 mg by mouth daily.     MELOXICAM (MOBIC) 7.5 MG TABLET    TAKE 1 TABLET BY MOUTH EVERY DAY FOR ARTHRITIS PAIN   METFORMIN (GLUCOPHAGE) 1000 MG TABLET    TAKE 1 TABLET BY MOUTH TWICE DAILY TO TREAT DIABETES   MULTIPLE VITAMINS-MINERALS (MULTIVITAMIN WITH MINERALS) TABLET    Take 1 tablet by mouth daily.     OMEPRAZOLE (PRILOSEC) 20 MG CAPSULE    TAKE ONE CAPSULE BY MOUTH EVERY DAY TO REDUCE STOMACH ACID  Modified Medications   No medications on file  Discontinued Medications   No medications on file     Review of Systems  Constitutional: Negative.   HENT: Positive for hearing loss and postnasal drip.   Eyes: Negative.   Respiratory: Negative.   Cardiovascular: Negative for  chest pain, palpitations and leg swelling.  Gastrointestinal:       Occasional heartburn  Endocrine:       Diabetic  Musculoskeletal: Positive for myalgias, back pain and arthralgias. Negative for joint swelling and gait problem.  Skin:       Complains of thinning hair  Neurological: Negative.   Hematological: Negative.   Psychiatric/Behavioral: Positive for sleep disturbance (insomnia).    Filed Vitals:   06/25/14 1402  BP: 132/72  Pulse: 78  Temp: 98.1 F (36.7 C)  TempSrc: Oral  Height: 5' 3"  (1.6 m)  Weight: 145 lb 3.2 oz (65.862 kg)   Body mass index is 25.73 kg/(m^2).  Physical Exam  Constitutional: She is oriented to person, place, and time. She appears well-developed and well-nourished. No distress.  HENT:  Head: Normocephalic and atraumatic.  Nose: Nose normal.  Mild bilateral hearing loss.  Eyes: Conjunctivae and EOM are normal. Pupils are equal, round,  and reactive to light.  Neck: Neck supple. No JVD present. No tracheal deviation present. No thyromegaly present.  Cardiovascular: Normal rate, regular rhythm, normal heart sounds and intact distal pulses.  Exam reveals no gallop and no friction rub.   No murmur heard. Pulmonary/Chest: Effort normal and breath sounds normal. No respiratory distress. She has no wheezes. She has no rales.  Abdominal: Bowel sounds are normal. She exhibits no distension and no mass. There is no tenderness.  Musculoskeletal: Normal range of motion. She exhibits no edema or tenderness.  Patient has evidence of  rupture long head of the biceps proximally.in the right mid forearm. It is not tender. Previous bruising is resolved. The muscle is balled up in the mid right forearm. Tender to percussion in the lower back.  Lymphadenopathy:    She has no cervical adenopathy.  Neurological: She is alert and oriented to person, place, and time. She has normal reflexes. No cranial nerve deficit. Coordination normal.  Skin: No rash noted. No erythema. No pallor.  Here is quite thin over the scalp.  Psychiatric: She has a normal mood and affect. Her behavior is normal. Thought content normal.     Labs reviewed: Appointment on 06/23/2014  Component Date Value Ref Range Status  . Hgb A1c MFr Bld 06/23/2014 6.7* 4.8 - 5.6 % Final   Comment:          Pre-diabetes: 5.7 - 6.4          Diabetes: >6.4          Glycemic control for adults with diabetes: <7.0   . Est. average glucose Bld gHb Est-m* 06/23/2014 146   Final  . Glucose 06/23/2014 73  65 - 99 mg/dL Final  . BUN 06/23/2014 16  8 - 27 mg/dL Final  . Creatinine, Ser 06/23/2014 0.64  0.57 - 1.00 mg/dL Final  . GFR calc non Af Amer 06/23/2014 93  >59 mL/min/1.73 Final  . GFR calc Af Amer 06/23/2014 107  >59 mL/min/1.73 Final  . BUN/Creatinine Ratio 06/23/2014 25  11 - 26 Final  . Sodium 06/23/2014 143  134 - 144 mmol/L Final  . Potassium 06/23/2014 4.0  3.5 - 5.2 mmol/L  Final  . Chloride 06/23/2014 103  97 - 108 mmol/L Final  . CO2 06/23/2014 23  18 - 29 mmol/L Final  . Calcium 06/23/2014 9.4  8.7 - 10.3 mg/dL Final  . Cholesterol, Total 06/23/2014 255* 100 - 199 mg/dL Final  . Triglycerides 06/23/2014 68  0 - 149 mg/dL Final  . HDL 06/23/2014 81  >  39 mg/dL Final   Comment: According to ATP-III Guidelines, HDL-C >59 mg/dL is considered a negative risk factor for CHD.   [name] Kitchen VLDL Cholesterol Cal 06/23/2014 14  5 - 40 mg/dL Final  . LDL Calculated 06/23/2014 160* 0 - 99 mg/dL Final  . Chol/HDL Ratio 06/23/2014 3.1  0.0 - 4.4 ratio units Final   Comment:                                   T. Chol/HDL Ratio                                             Men  Women                               1/2 Avg.Risk  3.4    3.3                                   Avg.Risk  5.0    4.4                                2X Avg.Risk  9.6    7.1                                3X Avg.Risk 23.4   11.0   . Microalbum.,U,Random 06/23/2014 <3.0  0.0 - 17.0 ug/mL Final     Assessment/Plan  1. Diabetes mellitus type 2, controlled Controlled - Hemoglobin A1c; Future - Comprehensive metabolic panel; Future  2. Essential hypertension Controlled - Comprehensive metabolic panel; Future  3. Hyperlipemia Adequately controlled. Effects of HDL would be anticipated to offset the LDL. No new medications recommended. - Lipid panel; Future  4. Itch Try Benadryl gel - TSH  5. Insomnia Discussed medications to help insomnia with patient. She is not interested in starting a new medicine at this time.  6. Hearing loss, unspecified laterality - Ambulatory referral to Audiology  7. Hair thinning - Minoxidil (ROGAINE WOMENS) 5 % FOAM; Apply daily to the scalp  Dispense: 60 g; Refill: 3 - TSH

## 2014-06-26 LAB — TSH: TSH: 2.15 u[IU]/mL (ref 0.450–4.500)

## 2014-06-26 LAB — SPECIMEN STATUS REPORT

## 2014-06-27 ENCOUNTER — Telehealth: Payer: Self-pay

## 2014-06-27 NOTE — Telephone Encounter (Signed)
Patient left message, wants thyroid test from Wednesday. TSH normal, Dr. Nyoka Cowden has not review labs results yet, as soon as he does we will call her back. Patient ok with this

## 2014-07-07 ENCOUNTER — Other Ambulatory Visit: Payer: Self-pay | Admitting: Internal Medicine

## 2014-07-21 ENCOUNTER — Ambulatory Visit (HOSPITAL_COMMUNITY)
Admission: RE | Admit: 2014-07-21 | Discharge: 2014-07-21 | Disposition: A | Discharge status: Home / Self Care | Payer: Medicare Other | Source: Ambulatory Visit | Attending: Internal Medicine | Admitting: Internal Medicine

## 2014-07-21 DIAGNOSIS — Z1231 Encounter for screening mammogram for malignant neoplasm of breast: Secondary | ICD-10-CM

## 2014-07-22 DIAGNOSIS — H903 Sensorineural hearing loss, bilateral: Secondary | ICD-10-CM | POA: Diagnosis not present

## 2014-07-25 ENCOUNTER — Other Ambulatory Visit: Payer: Self-pay | Admitting: Internal Medicine

## 2014-07-25 ENCOUNTER — Other Ambulatory Visit: Payer: Self-pay

## 2014-07-25 MED ORDER — METFORMIN HCL 1000 MG PO TABS: ORAL_TABLET | ORAL | Status: DC

## 2014-07-25 NOTE — Telephone Encounter (Signed)
Fax sent.

## 2014-07-29 DIAGNOSIS — H40023 Open angle with borderline findings, high risk, bilateral: Secondary | ICD-10-CM | POA: Diagnosis not present

## 2014-07-29 DIAGNOSIS — H2513 Age-related nuclear cataract, bilateral: Secondary | ICD-10-CM | POA: Diagnosis not present

## 2014-07-29 DIAGNOSIS — Z794 Long term (current) use of insulin: Secondary | ICD-10-CM | POA: Diagnosis not present

## 2014-07-29 DIAGNOSIS — E119 Type 2 diabetes mellitus without complications: Secondary | ICD-10-CM | POA: Diagnosis not present

## 2014-08-19 ENCOUNTER — Other Ambulatory Visit: Payer: Self-pay | Admitting: *Deleted

## 2014-08-26 ENCOUNTER — Encounter: Payer: Self-pay | Admitting: Internal Medicine

## 2014-09-20 ENCOUNTER — Other Ambulatory Visit: Payer: Self-pay | Admitting: Internal Medicine

## 2014-09-29 ENCOUNTER — Other Ambulatory Visit: Payer: Self-pay | Admitting: *Deleted

## 2014-09-29 MED ORDER — AMBULATORY NON FORMULARY MEDICATION: Status: DC

## 2014-09-29 NOTE — Telephone Encounter (Signed)
[name]

## 2014-10-16 ENCOUNTER — Other Ambulatory Visit: Payer: Self-pay

## 2014-10-16 MED ORDER — MELOXICAM 7.5 MG PO TABS: ORAL_TABLET | ORAL | Status: DC

## 2014-11-07 ENCOUNTER — Other Ambulatory Visit: Payer: Medicare Other

## 2014-11-07 DIAGNOSIS — E119 Type 2 diabetes mellitus without complications: Secondary | ICD-10-CM | POA: Diagnosis not present

## 2014-11-07 DIAGNOSIS — I10 Essential (primary) hypertension: Secondary | ICD-10-CM | POA: Diagnosis not present

## 2014-11-07 DIAGNOSIS — E785 Hyperlipidemia, unspecified: Secondary | ICD-10-CM

## 2014-11-08 LAB — COMPREHENSIVE METABOLIC PANEL
A/G RATIO: 1.2 (ref 1.1–2.5)
ALBUMIN: 3.8 g/dL (ref 3.6–4.8)
ALK PHOS: 75 IU/L (ref 39–117)
ALT: 48 IU/L — AB (ref 0–32)
AST: 47 IU/L — AB (ref 0–40)
BILIRUBIN TOTAL: 0.5 mg/dL (ref 0.0–1.2)
BUN/Creatinine Ratio: 22 (ref 11–26)
BUN: 15 mg/dL (ref 8–27)
CALCIUM: 9.6 mg/dL (ref 8.7–10.3)
CHLORIDE: 104 mmol/L (ref 97–108)
CO2: 26 mmol/L (ref 18–29)
Creatinine, Ser: 0.69 mg/dL (ref 0.57–1.00)
GFR calc non Af Amer: 90 mL/min/{1.73_m2} (ref >59)
GFR, EST AFRICAN AMERICAN: 104 mL/min/{1.73_m2} (ref >59)
GLUCOSE: 159 mg/dL — AB (ref 65–99)
Globulin, Total: 3.1 g/dL (ref 1.5–4.5)
Potassium: 4.5 mmol/L (ref 3.5–5.2)
Sodium: 145 mmol/L — ABNORMAL HIGH (ref 134–144)
Total Protein: 6.9 g/dL (ref 6.0–8.5)

## 2014-11-08 LAB — LIPID PANEL
CHOLESTEROL TOTAL: 234 mg/dL — AB (ref 100–199)
Chol/HDL Ratio: 2.9 ratio units (ref 0.0–4.4)
HDL: 81 mg/dL (ref >39)
LDL CALC: 141 mg/dL — AB (ref 0–99)
Triglycerides: 62 mg/dL (ref 0–149)
VLDL Cholesterol Cal: 12 mg/dL (ref 5–40)

## 2014-11-08 LAB — HEMOGLOBIN A1C
ESTIMATED AVERAGE GLUCOSE: 157 mg/dL
Hgb A1c MFr Bld: 7.1 % — ABNORMAL HIGH (ref 4.8–5.6)

## 2014-11-11 ENCOUNTER — Encounter: Payer: Self-pay | Admitting: Internal Medicine

## 2014-11-11 ENCOUNTER — Ambulatory Visit (INDEPENDENT_AMBULATORY_CARE_PROVIDER_SITE_OTHER): Payer: Medicare Other | Admitting: Internal Medicine

## 2014-11-11 VITALS — BP 136/80 | HR 73 | Temp 99.7°F | Resp 20 | Ht 63.0 in | Wt 152.4 lb

## 2014-11-11 DIAGNOSIS — E785 Hyperlipidemia, unspecified: Secondary | ICD-10-CM | POA: Diagnosis not present

## 2014-11-11 DIAGNOSIS — L659 Nonscarring hair loss, unspecified: Secondary | ICD-10-CM | POA: Diagnosis not present

## 2014-11-11 DIAGNOSIS — E119 Type 2 diabetes mellitus without complications: Secondary | ICD-10-CM

## 2014-11-11 DIAGNOSIS — H9190 Unspecified hearing loss, unspecified ear: Secondary | ICD-10-CM

## 2014-11-11 DIAGNOSIS — I10 Essential (primary) hypertension: Secondary | ICD-10-CM

## 2014-11-11 MED ORDER — INSULIN PEN NEEDLE 31G X 5 MM MISC: 40.0000 [IU] | Freq: Every day | Status: DC

## 2014-11-11 MED ORDER — INSULIN GLARGINE 100 UNIT/ML SOLOSTAR PEN: 40.0000 [IU] | PEN_INJECTOR | Freq: Every day | SUBCUTANEOUS | Status: DC

## 2014-11-12 ENCOUNTER — Telehealth: Payer: Self-pay

## 2014-11-12 NOTE — Progress Notes (Signed)
Patient ID: [name], female   DOB: 1945-10-31, 69 y.o.   MRN: [id number]     Facility  [facility]    Place of Service:   OFFICE    Allergies  Allergen Reactions  . Latex Dermatitis  . Tdap [Diphth-Acell Pertussis-Tetanus]     Chief Complaint  Patient presents with  . Medical Management of Chronic Issues    HPI:  Diabetes mellitus type 2, controlled: More recently seeing fasting CBGs of 140-150, but typically was running 90s to as low as 70 fasting. Slight rise in A1C to 7.1. Went on cruise and ate poorly in June, has also been traveling and with family eating poorly much lately. Hoping to get back on Atkins diet and improved diet as have done previously. Would like to use diet rather than increasing insulin to regain control of DM  Essential hypertension: Controlled  Hyperlipemia: Modest improvement in lipid panel  Hearing loss, unspecified laterality: Has seen Audiology, now has bilateral hearing aids, quite happy with them, able to hear grandchildren now  Hair thinning- Rogaine was not helpful. Now taking Biotin, feels like is improving.    Medications: Patient's Medications  New Prescriptions   No medications on file  Previous Medications   AMBULATORY NON FORMULARY MEDICATION    One Touch Ultra Blue Test Strips Sig: Use to test blood sugar three times daily to control blood sugar Dx: E11.9   CALCIUM CARBONATE (OS-CAL) 600 MG TABS    Take 600 mg by mouth 2 (two) times daily with a meal.     CHOLECALCIFEROL (VITAMIN D) 1000 UNITS TABLET    Take 1,000 Units by mouth daily.     CINNAMON 500 MG TABS    Take 1,000 mg by mouth. Take 2 tablets daily   CIPROFLOXACIN (CIPRO) 250 MG TABLET    Take 250 mg by mouth 2 (two) times daily. Take one tablet after intercourse to prevent urinary tract infection   CYANOCOBALAMIN (VITAMIN B 12 PO)    Take 1,000 mcg by mouth daily.     FISH OIL-OMEGA-3 FATTY ACIDS 1000 MG CAPSULE    Take 1 g by mouth daily.     FLUTICASONE (FLONASE) 50  MCG/ACT NASAL SPRAY    Place 2 sprays into both nostrils 2 (two) times daily. Decrease to 2 sprays/nostril daily after 5 days   GLUCOSE BLOOD TEST STRIP    Use as instructed to test blood sugar three times daily  Dx  250.00   INSULIN GLARGINE (LANTUS) 100 UNIT/ML INJECTION    Inject 0.3 mLs (30 Units total) into the skin daily. Inject night at 10pm for blood sugar   INSULIN SYRINGES, DISPOSABLE, U-100 1 ML MISC    Use with injecting insulin. Dx: E11.9   LORATADINE (CLARITIN) 10 MG TABLET    Take 10 mg by mouth daily.     MELOXICAM (MOBIC) 7.5 MG TABLET    TAKE 1 TABLET BY MOUTH EVERY DAY FOR ARTHRITIS PAIN   METFORMIN (GLUCOPHAGE) 1000 MG TABLET    TAKE 1 TABLET BY MOUTH TWICE DAILY TO TREAT DIABETES   MINOXIDIL (ROGAINE WOMENS) 5 % FOAM    Apply daily to the scalp   MULTIPLE VITAMINS-MINERALS (MULTIVITAMIN WITH MINERALS) TABLET    Take 1 tablet by mouth daily.     OMEPRAZOLE (PRILOSEC) 20 MG CAPSULE    TAKE ONE CAPSULE BY MOUTH EVERY DAY TO REDUCE STOMACH ACID  Modified Medications   Modified Medication Previous Medication   INSULIN GLARGINE (LANTUS) 100 UNIT/ML SOLOSTAR  PEN Insulin Glargine (LANTUS) 100 UNIT/ML Solostar Pen      Inject 40 Units into the skin daily at 10 pm.    Inject 40 Units into the skin daily at 10 pm.   INSULIN PEN NEEDLE 31G X 5 MM MISC Insulin Pen Needle 31G X 5 MM MISC      40 Units by Does not apply route at bedtime.    by Does not apply route.  Discontinued Medications   No medications on file     Review of Systems  Constitutional: Negative.   HENT: Positive for hearing loss and postnasal drip.   Eyes: Negative.   Respiratory: Negative.   Cardiovascular: Positive for chest pain. Negative for palpitations and leg swelling.  Gastrointestinal:       Occasional heartburn  Endocrine:       Diabetic  Musculoskeletal: Positive for myalgias, back pain and arthralgias. Negative for joint swelling and gait problem.  Skin:       Complains of thinning hair    Neurological: Negative.   Hematological: Negative.   Psychiatric/Behavioral: Positive for sleep disturbance (insomnia).    Filed Vitals:   11/11/14 1530  BP: 136/80  Pulse: 73  Temp: 99.7 F (37.6 C)  TempSrc: Oral  Resp: 20  Height: 5' 3"  (1.6 m)  Weight: 152 lb 6.4 oz (69.128 kg)  SpO2: 96%   Body mass index is 27 kg/(m^2).  Physical Exam  Constitutional: She is oriented to person, place, and time. She appears well-developed and well-nourished. No distress.  HENT:  Head: Normocephalic and atraumatic.  Nose: Nose normal.  Bilateral hearing aids  Eyes: Conjunctivae and EOM are normal. Pupils are equal, round, and reactive to light.  Neck: Neck supple. No JVD present. No tracheal deviation present. No thyromegaly present.  Cardiovascular: Normal rate, regular rhythm, normal heart sounds and intact distal pulses.  Exam reveals no gallop and no friction rub.   No murmur heard. Pulmonary/Chest: Effort normal and breath sounds normal. No respiratory distress. She has no wheezes. She has no rales.  Abdominal: Bowel sounds are normal. She exhibits no distension and no mass. There is no tenderness.  Musculoskeletal: Normal range of motion. She exhibits no edema or tenderness.  Patient has evidence of  rupture long head of the biceps proximally.in the right mid forearm. It is not tender. Previous bruising is resolved. The muscle is balled up in the mid right forearm. Tender to percussion in the lower back.  Lymphadenopathy:    She has no cervical adenopathy.  Neurological: She is alert and oriented to person, place, and time. She has normal reflexes. No cranial nerve deficit. Coordination normal.  Skin: No rash noted. No erythema. No pallor.  Hair is quite thin over the scalp.  Psychiatric: She has a normal mood and affect. Her behavior is normal. Thought content normal.     Labs reviewed: Appointment on 11/07/2014  Component Date Value Ref Range Status  . Hgb A1c MFr Bld  11/07/2014 7.1* 4.8 - 5.6 % Final   Comment:          Pre-diabetes: 5.7 - 6.4          Diabetes: >6.4          Glycemic control for adults with diabetes: <7.0   . Est. average glucose Bld gHb Est-m* 11/07/2014 157   Final  . Glucose 11/07/2014 159* 65 - 99 mg/dL Final  . BUN 11/07/2014 15  8 - 27 mg/dL Final  . Creatinine, Ser 11/07/2014  0.69  0.57 - 1.00 mg/dL Final  . GFR calc non Af Amer 11/07/2014 90  >59 mL/min/1.73 Final  . GFR calc Af Amer 11/07/2014 104  >59 mL/min/1.73 Final  . BUN/Creatinine Ratio 11/07/2014 22  11 - 26 Final  . Sodium 11/07/2014 145* 134 - 144 mmol/L Final  . Potassium 11/07/2014 4.5  3.5 - 5.2 mmol/L Final  . Chloride 11/07/2014 104  97 - 108 mmol/L Final  . CO2 11/07/2014 26  18 - 29 mmol/L Final  . Calcium 11/07/2014 9.6  8.7 - 10.3 mg/dL Final  . Total Protein 11/07/2014 6.9  6.0 - 8.5 g/dL Final  . Albumin 11/07/2014 3.8  3.6 - 4.8 g/dL Final  . Globulin, Total 11/07/2014 3.1  1.5 - 4.5 g/dL Final  . Albumin/Globulin Ratio 11/07/2014 1.2  1.1 - 2.5 Final  . Bilirubin Total 11/07/2014 0.5  0.0 - 1.2 mg/dL Final  . Alkaline Phosphatase 11/07/2014 75  39 - 117 IU/L Final  . AST 11/07/2014 47* 0 - 40 IU/L Final  . ALT 11/07/2014 48* 0 - 32 IU/L Final  . Cholesterol, Total 11/07/2014 234* 100 - 199 mg/dL Final  . Triglycerides 11/07/2014 62  0 - 149 mg/dL Final  . HDL 11/07/2014 81  >39 mg/dL Final   Comment: According to ATP-III Guidelines, HDL-C >59 mg/dL is considered a negative risk factor for CHD.   [name] Kitchen VLDL Cholesterol Cal 11/07/2014 12  5 - 40 mg/dL Final  . LDL Calculated 11/07/2014 141* 0 - 99 mg/dL Final  . Chol/HDL Ratio 11/07/2014 2.9  0.0 - 4.4 ratio units Final   Comment:                                   T. Chol/HDL Ratio                                             Men  Women                               1/2 Avg.Risk  3.4    3.3                                   Avg.Risk  5.0    4.4                                2X Avg.Risk  9.6     7.1                                3X Avg.Risk 23.4   11.0      Assessment/Plan 1. Type 2 diabetes mellitus without complication - Dietary changes emphasized - Insulin Pen Needle 31G X 5 MM MISC; 40 Units by Does not apply route at bedtime.  Dispense: 100 each; Refill: 3 - Insulin Glargine (LANTUS) 100 UNIT/ML Solostar Pen; Inject 40 Units into the skin daily at 10 pm.  Dispense: 15 mL; Refill: 3 - Hemoglobin A1c; Future - Comprehensive metabolic panel; Future  2. Essential hypertension Stable - Comprehensive metabolic panel;  Future  3. Hyperlipemia - Lipid panel; Future  4. Hearing loss, unspecified laterality Bilateral hearing aids  5. Hair thinning D/c Rogaine

## 2014-11-12 NOTE — Telephone Encounter (Signed)
Called [phone] to initiate PA, spoke whit Charmaine.  Patietn's ID number [id number] Medication approved through benefit year- 05/09/2015  Awaiting fax from [place], then I will call pharmacy to have rx re-submitted.

## 2014-11-13 NOTE — Telephone Encounter (Signed)
Fax was received confirming approval, approval was faxed to pharmacy

## 2014-12-02 ENCOUNTER — Other Ambulatory Visit: Payer: Self-pay | Admitting: *Deleted

## 2014-12-02 DIAGNOSIS — E119 Type 2 diabetes mellitus without complications: Secondary | ICD-10-CM

## 2014-12-02 MED ORDER — INSULIN PEN NEEDLE 31G X 5 MM MISC: Status: DC

## 2014-12-02 NOTE — Telephone Encounter (Signed)
Patient called requested refill to be sent to pharmacy. Faxed.

## 2015-01-01 ENCOUNTER — Telehealth: Payer: Self-pay

## 2015-01-01 ENCOUNTER — Other Ambulatory Visit: Payer: Self-pay | Admitting: Internal Medicine

## 2015-01-01 MED ORDER — INSULIN GLARGINE 100 UNIT/ML ~~LOC~~ SOLN: 40.0000 [IU] | Freq: Every day | SUBCUTANEOUS | Status: DC

## 2015-01-01 NOTE — Telephone Encounter (Signed)
Patient would like to change her insulin from pens to vials due to cost. Patient states she has plenty of syringes and needles on hand.    Patient would like rx sent to [facility] on [address] [facility]. Please advise

## 2015-01-01 NOTE — Telephone Encounter (Signed)
It is okay to prescribe the insulin glargine (Lantus) in a 10 mL vial. She is currently using 40 units each evening.

## 2015-01-01 NOTE — Telephone Encounter (Signed)
RX changed and sent to pharmacy. Patient aware

## 2015-02-02 DIAGNOSIS — N3020 Other chronic cystitis without hematuria: Secondary | ICD-10-CM | POA: Diagnosis not present

## 2015-03-09 DIAGNOSIS — M545 Low back pain: Secondary | ICD-10-CM | POA: Diagnosis not present

## 2015-03-09 DIAGNOSIS — M542 Cervicalgia: Secondary | ICD-10-CM | POA: Diagnosis not present

## 2015-03-12 ENCOUNTER — Other Ambulatory Visit: Payer: Medicare Other

## 2015-03-16 DIAGNOSIS — M545 Low back pain: Secondary | ICD-10-CM | POA: Diagnosis not present

## 2015-03-16 DIAGNOSIS — M542 Cervicalgia: Secondary | ICD-10-CM | POA: Diagnosis not present

## 2015-03-17 ENCOUNTER — Ambulatory Visit: Payer: Medicare Other | Admitting: Internal Medicine

## 2015-03-23 ENCOUNTER — Other Ambulatory Visit: Payer: Self-pay | Admitting: Internal Medicine

## 2015-03-27 ENCOUNTER — Other Ambulatory Visit: Payer: Medicare Other

## 2015-03-27 DIAGNOSIS — E119 Type 2 diabetes mellitus without complications: Secondary | ICD-10-CM | POA: Diagnosis not present

## 2015-03-27 DIAGNOSIS — E785 Hyperlipidemia, unspecified: Secondary | ICD-10-CM

## 2015-03-27 DIAGNOSIS — I10 Essential (primary) hypertension: Secondary | ICD-10-CM | POA: Diagnosis not present

## 2015-03-27 DIAGNOSIS — M545 Low back pain: Secondary | ICD-10-CM | POA: Diagnosis not present

## 2015-03-27 DIAGNOSIS — M5126 Other intervertebral disc displacement, lumbar region: Secondary | ICD-10-CM | POA: Diagnosis not present

## 2015-03-28 LAB — COMPREHENSIVE METABOLIC PANEL
A/G RATIO: 1.3 (ref 1.1–2.5)
ALBUMIN: 3.9 g/dL (ref 3.6–4.8)
ALK PHOS: 77 IU/L (ref 39–117)
ALT: 40 IU/L — AB (ref 0–32)
AST: 46 IU/L — ABNORMAL HIGH (ref 0–40)
BILIRUBIN TOTAL: 0.6 mg/dL (ref 0.0–1.2)
BUN / CREAT RATIO: 23 (ref 11–26)
BUN: 15 mg/dL (ref 8–27)
CALCIUM: 9.5 mg/dL (ref 8.7–10.3)
CHLORIDE: 103 mmol/L (ref 97–106)
CO2: 24 mmol/L (ref 18–29)
Creatinine, Ser: 0.65 mg/dL (ref 0.57–1.00)
GFR calc non Af Amer: 92 mL/min/{1.73_m2} (ref >59)
GFR, EST AFRICAN AMERICAN: 105 mL/min/{1.73_m2} (ref >59)
Globulin, Total: 3.1 g/dL (ref 1.5–4.5)
Glucose: 103 mg/dL — ABNORMAL HIGH (ref 65–99)
POTASSIUM: 4.1 mmol/L (ref 3.5–5.2)
Sodium: 143 mmol/L (ref 136–144)
Total Protein: 7 g/dL (ref 6.0–8.5)

## 2015-03-28 LAB — LIPID PANEL
CHOLESTEROL TOTAL: 225 mg/dL — AB (ref 100–199)
Chol/HDL Ratio: 2.8 ratio units (ref 0.0–4.4)
HDL: 81 mg/dL (ref >39)
LDL Calculated: 132 mg/dL — ABNORMAL HIGH (ref 0–99)
Triglycerides: 62 mg/dL (ref 0–149)
VLDL Cholesterol Cal: 12 mg/dL (ref 5–40)

## 2015-03-28 LAB — HEMOGLOBIN A1C
Est. average glucose Bld gHb Est-mCnc: 157 mg/dL
HEMOGLOBIN A1C: 7.1 % — AB (ref 4.8–5.6)

## 2015-03-30 ENCOUNTER — Other Ambulatory Visit: Payer: Medicare Other

## 2015-03-31 ENCOUNTER — Ambulatory Visit (INDEPENDENT_AMBULATORY_CARE_PROVIDER_SITE_OTHER): Payer: Medicare Other | Admitting: Internal Medicine

## 2015-03-31 ENCOUNTER — Encounter: Payer: Self-pay | Admitting: Internal Medicine

## 2015-03-31 VITALS — BP 130/78 | HR 77 | Temp 97.6°F | Resp 12 | Ht 63.0 in | Wt 153.0 lb

## 2015-03-31 DIAGNOSIS — M810 Age-related osteoporosis without current pathological fracture: Secondary | ICD-10-CM | POA: Diagnosis not present

## 2015-03-31 DIAGNOSIS — E1165 Type 2 diabetes mellitus with hyperglycemia: Secondary | ICD-10-CM

## 2015-03-31 DIAGNOSIS — Z780 Asymptomatic menopausal state: Secondary | ICD-10-CM | POA: Diagnosis not present

## 2015-03-31 DIAGNOSIS — I10 Essential (primary) hypertension: Secondary | ICD-10-CM | POA: Diagnosis not present

## 2015-03-31 DIAGNOSIS — Z794 Long term (current) use of insulin: Secondary | ICD-10-CM

## 2015-03-31 DIAGNOSIS — Z23 Encounter for immunization: Secondary | ICD-10-CM | POA: Diagnosis not present

## 2015-03-31 DIAGNOSIS — E785 Hyperlipidemia, unspecified: Secondary | ICD-10-CM

## 2015-03-31 DIAGNOSIS — R202 Paresthesia of skin: Secondary | ICD-10-CM | POA: Diagnosis not present

## 2015-03-31 MED ORDER — INSULIN PEN NEEDLE 31G X 5 MM MISC: Status: DC

## 2015-03-31 MED ORDER — "INSULIN SYRINGE 31G X 5/16"" 0.5 ML MISC": Status: DC

## 2015-03-31 NOTE — Progress Notes (Signed)
Patient ID: [name], female   DOB: June 10, 1945, 69 y.o.   MRN: [id number]    Facility  [facility]    Place of Service:   OFFICE    Allergies  Allergen Reactions  . Latex Dermatitis  . Tdap [Diphth-Acell Pertussis-Tetanus]     Chief Complaint  Patient presents with  . Medical Management of Chronic Issues    4 month follow-up, discuss labs (copy printed). Discuss Advance Directive/Living Will  DM foot exam due  . Medication Management    Change insulin syringes and needles   . Orders    BMD     HPI:  Osteoporosis -time for routine follow-up of Bone Density  Controlled type 2 diabetes mellitus with hyperglycemia, with long-term current use of insulin ([place]) -describes episodes of occasional low blood sugar. Typically these occur the nighttime. She says that she is eating a regular bedtime snack of half of an orange at the time she takes her Lantus 40 units. She had dropped her Lantus down to 35 units because of these low blood sugars. Patient weighed 145 pounds in February 2016 but had gained to 152 pounds by July 2016. She is now 153 pounds  Essential hypertension - controlled  Hyperlipemia - controlled  Paresthesias -  patient is having some paresthesias would seem unrelated to her diabetes. She describes a tingling in the foot and legs. Sensation is intact.     Medications: Patient's Medications  New Prescriptions   No medications on file  Previous Medications   AMBULATORY NON FORMULARY MEDICATION    One Touch Ultra Blue Test Strips Sig: Use to test blood sugar three times daily to control blood sugar Dx: E11.9   CALCIUM CARBONATE (OS-CAL) 600 MG TABS    Take 600 mg by mouth 2 (two) times daily with a meal.     CHOLECALCIFEROL (VITAMIN D) 1000 UNITS TABLET    Take 1,000 Units by mouth daily.     CINNAMON 500 MG TABS    Take 1,000 mg by mouth. Take 2 tablets daily   CIPROFLOXACIN (CIPRO) 250 MG TABLET    Take 250 mg by mouth 2 (two) times daily. Take one tablet after  intercourse to prevent urinary tract infection   CYANOCOBALAMIN (VITAMIN B 12 PO)    Take 1,000 mcg by mouth daily.     FISH OIL-OMEGA-3 FATTY ACIDS 1000 MG CAPSULE    Take 1 g by mouth daily.     FLUTICASONE (FLONASE) 50 MCG/ACT NASAL SPRAY    Place 2 sprays into both nostrils 2 (two) times daily. Decrease to 2 sprays/nostril daily after 5 days   GLUCOSE BLOOD TEST STRIP    Use as instructed to test blood sugar three times daily  Dx  250.00   INSULIN GLARGINE (LANTUS) 100 UNIT/ML INJECTION    Inject 0.4 mLs (40 Units total) into the skin at bedtime.   INSULIN PEN NEEDLE (B-D UF III MINI PEN NEEDLES) 31G X 5 MM MISC    by Does not apply route. Use daily with the administration of insulin E11.9   INSULIN SYRINGE-NEEDLE U-100 (INSULIN SYRINGE .5CC/31GX5/16") 31G X 5/16" 0.5 ML MISC    by Does not apply route. Use daily with the administration of insulin E11.9   LORATADINE (CLARITIN) 10 MG TABLET    Take 10 mg by mouth daily.     MELOXICAM (MOBIC) 7.5 MG TABLET    TAKE 1 TABLET BY MOUTH EVERY DAY FOR ARTHRITIS PAIN   METFORMIN (GLUCOPHAGE) 1000 MG TABLET  TAKE 1 TABLET BY MOUTH TWICE DAILY TO TREAT DIABETES   MULTIPLE VITAMINS-MINERALS (MULTIVITAMIN WITH MINERALS) TABLET    Take 1 tablet by mouth daily.     OMEPRAZOLE (PRILOSEC) 20 MG CAPSULE    TAKE ONE CAPSULE BY MOUTH DAILY  Modified Medications   No medications on file  Discontinued Medications   INSULIN PEN NEEDLE 31G X 5 MM MISC    Use as directed for giving insulin. Dx: E11.9   INSULIN SYRINGES, DISPOSABLE, U-100 1 ML MISC    Use with injecting insulin. Dx: E11.9    Review of Systems  Constitutional: Negative.   HENT: Positive for hearing loss and postnasal drip.   Eyes: Negative.   Respiratory: Negative.   Cardiovascular: Positive for chest pain. Negative for palpitations and leg swelling.  Gastrointestinal:       Occasional heartburn  Endocrine:       Diabetic  Musculoskeletal: Positive for myalgias, back pain and arthralgias.  Negative for joint swelling and gait problem.  Skin:       Complains of thinning hair  Neurological:       Distal paresthesias of tingling and burning in the feet and lower legs  Hematological: Negative.   Psychiatric/Behavioral: Positive for sleep disturbance (insomnia).    Filed Vitals:   03/31/15 1545  BP: 130/78  Pulse: 77  Temp: 97.6 F (36.4 C)  TempSrc: Oral  Resp: 12  Height: _0  (1.6 m)  Weight: 153 lb (69.4 kg)  SpO2: 97%   Body mass index is 27.11 kg/(m^2).  Physical Exam  Constitutional: She is oriented to person, place, and time. She appears well-developed and well-nourished. No distress.  HENT:  Head: Normocephalic and atraumatic.  Nose: Nose normal.  Bilateral hearing aids  Eyes: Conjunctivae and EOM are normal. Pupils are equal, round, and reactive to light.  Neck: Neck supple. No JVD present. No tracheal deviation present. No thyromegaly present.  Cardiovascular: Normal rate, regular rhythm, normal heart sounds and intact distal pulses.  Exam reveals no gallop and no friction rub.   No murmur heard. Pulmonary/Chest: Effort normal and breath sounds normal. No respiratory distress. She has no wheezes. She has no rales.  Abdominal: Bowel sounds are normal. She exhibits no distension and no mass. There is no tenderness.  Musculoskeletal: Normal range of motion. She exhibits no edema or tenderness.  Patient has evidence of  rupture long head of the biceps proximally.in the right mid forearm. It is not tender. Previous bruising is resolved. The muscle is balled up in the mid right forearm. Tender to percussion in the lower back.  Lymphadenopathy:    She has no cervical adenopathy.  Neurological: She is alert and oriented to person, place, and time. She has normal reflexes. No cranial nerve deficit. Coordination normal.  Skin: No rash noted. No erythema. No pallor.  Hair is quite thin over the scalp.  Psychiatric: She has a normal mood and affect. Her behavior  is normal. Thought content normal.    Labs reviewed: Lab Summary Latest Ref Rng 03/27/2015 11/07/2014 06/23/2014  Hemoglobin 13.0-17.0 g/dL (None) (None) (None)  Hematocrit 39.0-52.0 % (None) (None) (None)  White count - (None) (None) (None)  Platelet count - (None) (None) (None)  Sodium 136 - 144 mmol/L 143 145(H) 143  Potassium 3.5 - 5.2 mmol/L 4.1 4.5 4.0  Calcium 8.7 - 10.3 mg/dL 9.5 9.6 9.4  Phosphorus - (None) (None) (None)  Creatinine 0.57 - 1.00 mg/dL 0.65 0.69 0.64  AST 0 - 40 IU/L  46(H) 47(H) (None)  Alk Phos 39 - 117 IU/L 77 75 (None)  Bilirubin 0.0 - 1.2 mg/dL 0.6 0.5 (None)  Glucose 65 - 99 mg/dL 103(H) 159(H) 73  Cholesterol - (None) (None) (None)  HDL cholesterol >39 mg/dL 81 81 81  Triglycerides 0 - 149 mg/dL 62 62 68  LDL Direct - (None) (None) (None)  LDL Calc 0 - 99 mg/dL 132(H) 141(H) 160(H)  Total protein - (None) (None) (None)  Albumin 3.6 - 4.8 g/dL 3.9 3.8 (None)   Lab Results  Component Value Date   TSH 2.150 06/23/2014   Lab Results  Component Value Date   BUN 15 03/27/2015   Lab Results  Component Value Date   HGBA1C 7.1* 03/27/2015    Assessment/Plan  1. Osteoporosis - DG Bone Density; Future  2. Controlled type 2 diabetes mellitus with hyperglycemia, with long-term current use of insulin (HCC) Advised to change bedtime snack to a higher protein-based snack such as a boiled egg, Peanut butter sandwich, Glucerna bar, etc. - Hemoglobin A1c; Future - Comprehensive metabolic panel; Future - Microalbumin, urine; Future  3. Essential hypertension Continue current medications - Comprehensive metabolic panel; Future  4. Hyperlipemia Continue current medications - Lipid panel; Future  5. Paresthesias Continue to observe

## 2015-04-01 ENCOUNTER — Encounter: Payer: Self-pay | Admitting: Internal Medicine

## 2015-04-01 DIAGNOSIS — G629 Polyneuropathy, unspecified: Secondary | ICD-10-CM

## 2015-04-01 DIAGNOSIS — R202 Paresthesia of skin: Secondary | ICD-10-CM

## 2015-04-01 HISTORY — DX: Paresthesia of skin: R20.2

## 2015-04-01 HISTORY — DX: Polyneuropathy, unspecified: G62.9

## 2015-04-10 DIAGNOSIS — M545 Low back pain: Secondary | ICD-10-CM | POA: Diagnosis not present

## 2015-04-10 DIAGNOSIS — Z6827 Body mass index (BMI) 27.0-27.9, adult: Secondary | ICD-10-CM | POA: Diagnosis not present

## 2015-04-10 DIAGNOSIS — R030 Elevated blood-pressure reading, without diagnosis of hypertension: Secondary | ICD-10-CM | POA: Diagnosis not present

## 2015-04-10 DIAGNOSIS — M542 Cervicalgia: Secondary | ICD-10-CM | POA: Diagnosis not present

## 2015-04-12 ENCOUNTER — Other Ambulatory Visit: Payer: Self-pay | Admitting: Internal Medicine

## 2015-04-16 NOTE — Addendum Note (Signed)
Addended by: Rafael Bihari A on: 04/16/2015 09:03 AM   Modules accepted: Orders

## 2015-04-17 ENCOUNTER — Ambulatory Visit
Admission: RE | Admit: 2015-04-17 | Discharge: 2015-04-17 | Disposition: A | Discharge status: Home / Self Care | Payer: Medicare Other | Source: Ambulatory Visit | Attending: Internal Medicine | Admitting: Internal Medicine

## 2015-04-17 DIAGNOSIS — Z780 Asymptomatic menopausal state: Secondary | ICD-10-CM

## 2015-04-17 DIAGNOSIS — M8589 Other specified disorders of bone density and structure, multiple sites: Secondary | ICD-10-CM | POA: Diagnosis not present

## 2015-04-20 DIAGNOSIS — M5126 Other intervertebral disc displacement, lumbar region: Secondary | ICD-10-CM | POA: Diagnosis not present

## 2015-04-20 DIAGNOSIS — Z6827 Body mass index (BMI) 27.0-27.9, adult: Secondary | ICD-10-CM | POA: Diagnosis not present

## 2015-04-20 DIAGNOSIS — Z794 Long term (current) use of insulin: Secondary | ICD-10-CM | POA: Diagnosis not present

## 2015-04-20 DIAGNOSIS — M5416 Radiculopathy, lumbar region: Secondary | ICD-10-CM | POA: Diagnosis not present

## 2015-04-20 DIAGNOSIS — R030 Elevated blood-pressure reading, without diagnosis of hypertension: Secondary | ICD-10-CM | POA: Diagnosis not present

## 2015-04-20 DIAGNOSIS — E119 Type 2 diabetes mellitus without complications: Secondary | ICD-10-CM | POA: Diagnosis not present

## 2015-04-28 DIAGNOSIS — R030 Elevated blood-pressure reading, without diagnosis of hypertension: Secondary | ICD-10-CM | POA: Diagnosis not present

## 2015-04-28 DIAGNOSIS — Z6827 Body mass index (BMI) 27.0-27.9, adult: Secondary | ICD-10-CM | POA: Diagnosis not present

## 2015-04-28 DIAGNOSIS — M5416 Radiculopathy, lumbar region: Secondary | ICD-10-CM | POA: Diagnosis not present

## 2015-05-05 ENCOUNTER — Other Ambulatory Visit: Payer: Self-pay | Admitting: *Deleted

## 2015-05-05 MED ORDER — METFORMIN HCL 1000 MG PO TABS: ORAL_TABLET | ORAL | Status: DC

## 2015-05-05 NOTE — Telephone Encounter (Signed)
[place]

## 2015-05-09 ENCOUNTER — Encounter (HOSPITAL_COMMUNITY): Payer: Self-pay | Admitting: *Deleted

## 2015-05-09 DIAGNOSIS — I129 Hypertensive chronic kidney disease with stage 1 through stage 4 chronic kidney disease, or unspecified chronic kidney disease: Secondary | ICD-10-CM | POA: Diagnosis not present

## 2015-05-09 DIAGNOSIS — Z794 Long term (current) use of insulin: Secondary | ICD-10-CM | POA: Diagnosis not present

## 2015-05-09 DIAGNOSIS — Z7951 Long term (current) use of inhaled steroids: Secondary | ICD-10-CM | POA: Diagnosis not present

## 2015-05-09 DIAGNOSIS — Z7984 Long term (current) use of oral hypoglycemic drugs: Secondary | ICD-10-CM | POA: Insufficient documentation

## 2015-05-09 DIAGNOSIS — R2242 Localized swelling, mass and lump, left lower limb: Secondary | ICD-10-CM | POA: Diagnosis not present

## 2015-05-09 DIAGNOSIS — E785 Hyperlipidemia, unspecified: Secondary | ICD-10-CM | POA: Insufficient documentation

## 2015-05-09 DIAGNOSIS — M79605 Pain in left leg: Secondary | ICD-10-CM | POA: Diagnosis not present

## 2015-05-09 DIAGNOSIS — Z791 Long term (current) use of non-steroidal anti-inflammatories (NSAID): Secondary | ICD-10-CM | POA: Diagnosis not present

## 2015-05-09 DIAGNOSIS — Z91040 Latex allergy status: Secondary | ICD-10-CM | POA: Insufficient documentation

## 2015-05-09 DIAGNOSIS — M79662 Pain in left lower leg: Secondary | ICD-10-CM | POA: Insufficient documentation

## 2015-05-09 DIAGNOSIS — M1990 Unspecified osteoarthritis, unspecified site: Secondary | ICD-10-CM | POA: Insufficient documentation

## 2015-05-09 DIAGNOSIS — Z79899 Other long term (current) drug therapy: Secondary | ICD-10-CM | POA: Diagnosis not present

## 2015-05-09 DIAGNOSIS — E119 Type 2 diabetes mellitus without complications: Secondary | ICD-10-CM | POA: Insufficient documentation

## 2015-05-09 DIAGNOSIS — Z85828 Personal history of other malignant neoplasm of skin: Secondary | ICD-10-CM | POA: Diagnosis not present

## 2015-05-09 DIAGNOSIS — K219 Gastro-esophageal reflux disease without esophagitis: Secondary | ICD-10-CM | POA: Insufficient documentation

## 2015-05-09 DIAGNOSIS — Z792 Long term (current) use of antibiotics: Secondary | ICD-10-CM | POA: Insufficient documentation

## 2015-05-09 DIAGNOSIS — N181 Chronic kidney disease, stage 1: Secondary | ICD-10-CM | POA: Insufficient documentation

## 2015-05-09 DIAGNOSIS — Z8669 Personal history of other diseases of the nervous system and sense organs: Secondary | ICD-10-CM | POA: Diagnosis not present

## 2015-05-09 NOTE — ED Notes (Signed)
Pt c/o lt lower leg pain for 2-3 hours tonight.  She has varicose veins that i see

## 2015-05-10 ENCOUNTER — Emergency Department (HOSPITAL_COMMUNITY)
Admission: EM | Admit: 2015-05-10 | Discharge: 2015-05-10 | Disposition: A | Discharge status: Home / Self Care | Payer: Medicare Other | Attending: Emergency Medicine | Admitting: Emergency Medicine

## 2015-05-10 ENCOUNTER — Ambulatory Visit (HOSPITAL_BASED_OUTPATIENT_CLINIC_OR_DEPARTMENT_OTHER)
Admission: RE | Admit: 2015-05-10 | Discharge: 2015-05-10 | Disposition: A | Discharge status: Home / Self Care | Payer: Medicare Other | Source: Ambulatory Visit | Attending: Emergency Medicine | Admitting: Emergency Medicine

## 2015-05-10 DIAGNOSIS — M79605 Pain in left leg: Secondary | ICD-10-CM | POA: Insufficient documentation

## 2015-05-10 DIAGNOSIS — M79609 Pain in unspecified limb: Secondary | ICD-10-CM

## 2015-05-10 DIAGNOSIS — Z8669 Personal history of other diseases of the nervous system and sense organs: Secondary | ICD-10-CM | POA: Diagnosis not present

## 2015-05-10 DIAGNOSIS — E119 Type 2 diabetes mellitus without complications: Secondary | ICD-10-CM | POA: Diagnosis not present

## 2015-05-10 DIAGNOSIS — Z85828 Personal history of other malignant neoplasm of skin: Secondary | ICD-10-CM | POA: Diagnosis not present

## 2015-05-10 DIAGNOSIS — M1990 Unspecified osteoarthritis, unspecified site: Secondary | ICD-10-CM | POA: Diagnosis not present

## 2015-05-10 DIAGNOSIS — Z791 Long term (current) use of non-steroidal anti-inflammatories (NSAID): Secondary | ICD-10-CM | POA: Diagnosis not present

## 2015-05-10 DIAGNOSIS — M79662 Pain in left lower leg: Secondary | ICD-10-CM | POA: Diagnosis not present

## 2015-05-10 DIAGNOSIS — Z794 Long term (current) use of insulin: Secondary | ICD-10-CM | POA: Diagnosis not present

## 2015-05-10 DIAGNOSIS — M7989 Other specified soft tissue disorders: Secondary | ICD-10-CM | POA: Diagnosis not present

## 2015-05-10 DIAGNOSIS — Z79899 Other long term (current) drug therapy: Secondary | ICD-10-CM | POA: Diagnosis not present

## 2015-05-10 DIAGNOSIS — Z91040 Latex allergy status: Secondary | ICD-10-CM | POA: Diagnosis not present

## 2015-05-10 DIAGNOSIS — Z792 Long term (current) use of antibiotics: Secondary | ICD-10-CM | POA: Diagnosis not present

## 2015-05-10 DIAGNOSIS — E785 Hyperlipidemia, unspecified: Secondary | ICD-10-CM | POA: Diagnosis not present

## 2015-05-10 DIAGNOSIS — Z7951 Long term (current) use of inhaled steroids: Secondary | ICD-10-CM | POA: Diagnosis not present

## 2015-05-10 DIAGNOSIS — I129 Hypertensive chronic kidney disease with stage 1 through stage 4 chronic kidney disease, or unspecified chronic kidney disease: Secondary | ICD-10-CM | POA: Diagnosis not present

## 2015-05-10 DIAGNOSIS — R2242 Localized swelling, mass and lump, left lower limb: Secondary | ICD-10-CM | POA: Diagnosis not present

## 2015-05-10 DIAGNOSIS — Z7984 Long term (current) use of oral hypoglycemic drugs: Secondary | ICD-10-CM | POA: Diagnosis not present

## 2015-05-10 DIAGNOSIS — K219 Gastro-esophageal reflux disease without esophagitis: Secondary | ICD-10-CM | POA: Diagnosis not present

## 2015-05-10 DIAGNOSIS — N181 Chronic kidney disease, stage 1: Secondary | ICD-10-CM | POA: Diagnosis not present

## 2015-05-10 LAB — D-DIMER, QUANTITATIVE: D-Dimer, Quant: 1.42 ug{FEU}/mL — ABNORMAL HIGH (ref 0.00–0.50)

## 2015-05-10 LAB — CBC WITH DIFFERENTIAL/PLATELET
Basophils Absolute: 0 10*3/uL (ref 0.0–0.1)
Basophils Relative: 1 %
Eosinophils Absolute: 0.1 10*3/uL (ref 0.0–0.7)
Eosinophils Relative: 3 %
HCT: 33.2 % — ABNORMAL LOW (ref 36.0–46.0)
Hemoglobin: 11.1 g/dL — ABNORMAL LOW (ref 12.0–15.0)
Lymphocytes Relative: 24 %
Lymphs Abs: 1.1 10*3/uL (ref 0.7–4.0)
MCH: 29.5 pg (ref 26.0–34.0)
MCHC: 33.4 g/dL (ref 30.0–36.0)
MCV: 88.3 fL (ref 78.0–100.0)
Monocytes Absolute: 0.7 10*3/uL (ref 0.1–1.0)
Monocytes Relative: 17 %
Neutro Abs: 2.5 10*3/uL (ref 1.7–7.7)
Neutrophils Relative %: 56 %
Platelets: 112 10*3/uL — ABNORMAL LOW (ref 150–400)
RBC: 3.76 MIL/uL — ABNORMAL LOW (ref 3.87–5.11)
RDW: 14.2 % (ref 11.5–15.5)
WBC: 4.4 10*3/uL (ref 4.0–10.5)

## 2015-05-10 LAB — BASIC METABOLIC PANEL WITH GFR
Anion gap: 10 (ref 5–15)
BUN: 13 mg/dL (ref 6–20)
CO2: 25 mmol/L (ref 22–32)
Calcium: 9.3 mg/dL (ref 8.9–10.3)
Chloride: 105 mmol/L (ref 101–111)
Creatinine, Ser: 0.78 mg/dL (ref 0.44–1.00)
GFR calc Af Amer: >60 mL/min
GFR calc non Af Amer: >60 mL/min
Glucose, Bld: 325 mg/dL — ABNORMAL HIGH (ref 65–99)
Potassium: 3.8 mmol/L (ref 3.5–5.1)
Sodium: 140 mmol/L (ref 135–145)

## 2015-05-10 MED ORDER — TRAMADOL HCL 50 MG PO TABS: 50.0000 mg | ORAL_TABLET | Freq: Four times a day (QID) | ORAL | Status: DC | PRN

## 2015-05-10 NOTE — ED Provider Notes (Signed)
CSN: [id number]     Arrival date & time 05/09/15  2327 History  By signing my name below, I, [name], attest that this documentation has been prepared under the direction and in the presence of Orpah Greek, MD. Electronically Signed: Sonum Conway, Education administrator. 05/10/2015. 12:33 AM.    Chief Complaint  Patient presents with  . Leg Pain   The history is provided by the patient. No language interpreter was used.     HPI Comments: [name] is a 70 y.o. female who presents to the Emergency Department complaining of 4 hours of constant, unchanged left lateral calf pain with intermittent warmth. Patient has a history of a herniated disc which causes pain radiating down the left lower extremity but states this calf pain feels different. She denies recent injury or trauma to the affected area. She denies color change.   Past Medical History  Diagnosis Date  . Allergy   . Arthritis   . GERD (gastroesophageal reflux disease)   . Diabetes mellitus   . Epistaxis   . Tension headache   . Cramp of limb   . Neoplasm of uncertain behavior of skin   . Dysuria   . Nonspecific elevation of levels of transaminase or lactic acid dehydrogenase (LDH)   . Dysphagia, unspecified(787.20)   . Unspecified essential hypertension   . Pain in joint, shoulder region   . Lumbago   . Other and unspecified hyperlipidemia   . Osteoarthrosis, unspecified whether generalized or localized, unspecified site   . Chronic kidney disease, stage I   . Postablative ovarian failure   . Type 2 diabetes mellitus without complication ([place])   . Paresthesias 04/01/2015   Past Surgical History  Procedure Laterality Date  . Vaginal hysterectomy  1997    Dr Rande Lawman  . Colonoscopy    . Upper gastrointestinal endoscopy    . Tubal ligation  1982    Dr Connye Burkitt   Family History  Problem Relation Age of Onset  . Cancer Father   . Arthritis Sister   . Arthritis Brother   . Heart disease Maternal Grandmother   .  Heart disease Maternal Grandfather   . Heart disease Paternal Grandmother   . Heart disease Paternal Grandfather   . Liver cancer Brother    Social History  Substance Use Topics  . Smoking status: Never Smoker   . Smokeless tobacco: Never Used  . Alcohol Use: No   OB History    No data available     Review of Systems  Musculoskeletal: Positive for myalgias.  Skin: Negative for color change.       +warmth  All other systems reviewed and are negative.     Allergies  Latex and Tdap  Home Medications   Prior to Admission medications   Medication Sig Start Date End Date Taking? Authorizing Provider  AMBULATORY NON FORMULARY MEDICATION One Touch Ultra Blue Test Strips Sig: Use to test blood sugar three times daily to control blood sugar Dx: E11.9 09/29/14   Estill Dooms, MD  calcium carbonate (OS-CAL) 600 MG TABS Take 600 mg by mouth 2 (two) times daily with a meal.      Historical Provider, MD  cholecalciferol (VITAMIN D) 1000 UNITS tablet Take 1,000 Units by mouth daily.      Historical Provider, MD  Cinnamon 500 MG TABS Take 1,000 mg by mouth. Take 2 tablets daily    Historical Provider, MD  ciprofloxacin (CIPRO) 250 MG tablet Take 250 mg by  mouth 2 (two) times daily. Take one tablet after intercourse to prevent urinary tract infection    Historical Provider, MD  Cyanocobalamin (VITAMIN B 12 PO) Take 1,000 mcg by mouth daily.      Historical Provider, MD  fish oil-omega-3 fatty acids 1000 MG capsule Take 1 g by mouth daily.      Historical Provider, MD  fluticasone (FLONASE) 50 MCG/ACT nasal spray Place 2 sprays into both nostrils 2 (two) times daily. Decrease to 2 sprays/nostril daily after 5 days 12/10/13   Liam Graham, PA-C  glucose blood test strip Use as instructed to test blood sugar three times daily  Dx  250.00 09/04/13   Estill Dooms, MD  insulin glargine (LANTUS) 100 UNIT/ML injection Inject 0.4 mLs (40 Units total) into the skin at bedtime. Patient taking  differently: 30-35 units at bedtime 01/01/15   Estill Dooms, MD  Insulin Pen Needle (B-D UF III MINI PEN NEEDLES) 31G X 5 MM MISC Use daily with the administration of insulin E11.9 03/31/15   Estill Dooms, MD  Insulin Syringe-Needle U-100 (INSULIN SYRINGE .5CC/31GX5/16") 31G X 5/16" 0.5 ML MISC Use daily with the administration of insulin E11.9 03/31/15   Estill Dooms, MD  loratadine (CLARITIN) 10 MG tablet Take 10 mg by mouth daily.      Historical Provider, MD  meloxicam (MOBIC) 7.5 MG tablet TAKE 1 TABLET BY MOUTH EVERY DAY FOR ARTHRITIS OR PAIN 04/13/15   Estill Dooms, MD  metFORMIN (GLUCOPHAGE) 1000 MG tablet Take one tablet by mouth twice daily to treat diabetes 05/05/15   Estill Dooms, MD  Multiple Vitamins-Minerals (MULTIVITAMIN WITH MINERALS) tablet Take 1 tablet by mouth daily.      Historical Provider, MD  omeprazole (PRILOSEC) 20 MG capsule TAKE ONE CAPSULE BY MOUTH DAILY 03/23/15   Estill Dooms, MD   BP 151/61 mmHg  Pulse 85  Temp(Src) 98.3 F (36.8 C) (Oral)  Resp 20  Ht 5' 3"  (1.6 m)  Wt 159 lb (72.122 kg)  BMI 28.17 kg/m2  SpO2 96% Physical Exam  Constitutional: She is oriented to person, place, and time. She appears well-developed and well-nourished. No distress.  HENT:  Head: Normocephalic and atraumatic.  Right Ear: Hearing normal.  Left Ear: Hearing normal.  Nose: Nose normal.  Mouth/Throat: Oropharynx is clear and moist and mucous membranes are normal.  Eyes: Conjunctivae and EOM are normal. Pupils are equal, round, and reactive to light.  Neck: Normal range of motion. Neck supple.  Cardiovascular: Normal rate, regular rhythm, S1 normal, S2 normal and intact distal pulses.  Exam reveals no gallop and no friction rub.   No murmur heard. Pulses:      Dorsalis pedis pulses are 2+ on the right side, and 2+ on the left side.  Pulmonary/Chest: Effort normal and breath sounds normal. No respiratory distress. She exhibits no tenderness.  Abdominal: Soft. Normal  appearance and bowel sounds are normal. There is no hepatosplenomegaly. There is no tenderness. There is no rebound, no guarding, no tenderness at McBurney's point and negative Murphy's sign. No hernia.  Musculoskeletal: Normal range of motion.  Mildly tender and swollen area of the lateral aspect of the left lower leg, mid fibular region. No circumferential swelling. Normal color. 2+ DP pulses bilaterally  Neurological: She is alert and oriented to person, place, and time. She has normal strength. No cranial nerve deficit or sensory deficit. Coordination normal. GCS eye subscore is 4. GCS verbal subscore is 5. GCS  motor subscore is 6.  Skin: Skin is warm, dry and intact. No rash noted. No cyanosis.  Psychiatric: She has a normal mood and affect. Her speech is normal and behavior is normal. Thought content normal.  Nursing note and vitals reviewed.   ED Course  Procedures (including critical care time)  DIAGNOSTIC STUDIES: Oxygen Saturation is 96% on RA, adequate by my interpretation.    COORDINATION OF CARE: 12:40 AM Discussed treatment plan with pt at bedside and pt agreed to plan.   Labs Review Labs Reviewed - No data to display  Imaging Review No results found. I have personally reviewed and evaluated these lab results as part of my medical decision-making.   EKG Interpretation None      MDM   Final diagnoses:  None  leg pain  Presents to the emergency department for evaluation of leg pain. Patient reports several hours of aching pain in the left lower leg. Patient reports that there was a knot on the outside portion of the calf earlier and it was warm to the touch. For that has resolved but did not still present, although it has gone down somewhat. She does not have circumferential calf swelling. There are no venous cords. Negative Homans sign. Patient will risk for DVT, however d-dimer is elevated and will require duplex in the a.m.  Examination was otherwise unremarkable.  There is no overlying skin changes in the region to suggest infection. Patient has palpable dorsalis pedal pulses and normal skin tone, no concern for arterial insufficiency.  I personally performed the services described in this documentation, which was scribed in my presence. The recorded information has been reviewed and is accurate.    Orpah Greek, MD 05/10/15 [phone]

## 2015-05-10 NOTE — ED Notes (Signed)
MD at bedside. 

## 2015-05-10 NOTE — Progress Notes (Signed)
VASCULAR LAB PRELIMINARY  PRELIMINARY  PRELIMINARY  PRELIMINARY  Left lower extremity venous duplex completed.    Preliminary report:  There is no DVT or SVT noted in the left lower extremity.   Cordel Drewes, RVT 05/10/2015, 8:35 AM

## 2015-05-10 NOTE — Discharge Instructions (Signed)

## 2015-05-13 ENCOUNTER — Telehealth: Payer: Self-pay | Admitting: *Deleted

## 2015-05-13 MED ORDER — INSULIN DETEMIR 100 UNIT/ML ~~LOC~~ SOLN: SUBCUTANEOUS | Status: DC

## 2015-05-13 NOTE — Progress Notes (Signed)
LMOM to return call.

## 2015-05-13 NOTE — Progress Notes (Signed)
Patient Notified

## 2015-05-13 NOTE — Telephone Encounter (Signed)
Received letter from [facility])  stating they will no longer cover Lantus and would like it changed to a preferred drug: Levemir or [place].  Per Dr. Rosalie Gums to Levemir. Same units/dose Patient notified and medication list updated and Rx faxed to pharmacy.  [employer] Member ID #: [id number]

## 2015-05-18 ENCOUNTER — Other Ambulatory Visit (HOSPITAL_COMMUNITY): Payer: Self-pay | Admitting: Neurological Surgery

## 2015-05-18 DIAGNOSIS — M4316 Spondylolisthesis, lumbar region: Secondary | ICD-10-CM | POA: Diagnosis not present

## 2015-06-02 ENCOUNTER — Ambulatory Visit (HOSPITAL_COMMUNITY)
Admission: RE | Admit: 2015-06-02 | Discharge: 2015-06-02 | Disposition: A | Discharge status: Home / Self Care | Payer: Medicare Other | Source: Ambulatory Visit | Attending: Neurological Surgery | Admitting: Neurological Surgery

## 2015-06-02 ENCOUNTER — Encounter (HOSPITAL_COMMUNITY): Payer: Self-pay

## 2015-06-02 ENCOUNTER — Telehealth: Payer: Self-pay | Admitting: *Deleted

## 2015-06-02 ENCOUNTER — Encounter (HOSPITAL_COMMUNITY)
Admission: RE | Admit: 2015-06-02 | Discharge: 2015-06-02 | Disposition: A | Discharge status: Home / Self Care | Payer: Medicare Other | Source: Ambulatory Visit | Attending: Neurological Surgery | Admitting: Neurological Surgery

## 2015-06-02 DIAGNOSIS — E785 Hyperlipidemia, unspecified: Secondary | ICD-10-CM | POA: Insufficient documentation

## 2015-06-02 DIAGNOSIS — Z01810 Encounter for preprocedural cardiovascular examination: Secondary | ICD-10-CM | POA: Diagnosis not present

## 2015-06-02 DIAGNOSIS — M4316 Spondylolisthesis, lumbar region: Secondary | ICD-10-CM | POA: Diagnosis not present

## 2015-06-02 DIAGNOSIS — M4310 Spondylolisthesis, site unspecified: Secondary | ICD-10-CM

## 2015-06-02 DIAGNOSIS — Z01812 Encounter for preprocedural laboratory examination: Secondary | ICD-10-CM | POA: Insufficient documentation

## 2015-06-02 DIAGNOSIS — R9431 Abnormal electrocardiogram [ECG] [EKG]: Secondary | ICD-10-CM | POA: Insufficient documentation

## 2015-06-02 DIAGNOSIS — E119 Type 2 diabetes mellitus without complications: Secondary | ICD-10-CM | POA: Diagnosis not present

## 2015-06-02 DIAGNOSIS — R918 Other nonspecific abnormal finding of lung field: Secondary | ICD-10-CM | POA: Insufficient documentation

## 2015-06-02 DIAGNOSIS — I10 Essential (primary) hypertension: Secondary | ICD-10-CM | POA: Insufficient documentation

## 2015-06-02 DIAGNOSIS — Z01818 Encounter for other preprocedural examination: Secondary | ICD-10-CM | POA: Diagnosis not present

## 2015-06-02 HISTORY — DX: Cardiac murmur, unspecified: R01.1

## 2015-06-02 LAB — CBC WITH DIFFERENTIAL/PLATELET
Basophils Absolute: 0 10*3/uL (ref 0.0–0.1)
Basophils Relative: 1 %
EOS ABS: 0.1 10*3/uL (ref 0.0–0.7)
EOS PCT: 2 %
HCT: 36.8 % (ref 36.0–46.0)
Hemoglobin: 12.2 g/dL (ref 12.0–15.0)
LYMPHS ABS: 1.2 10*3/uL (ref 0.7–4.0)
Lymphocytes Relative: 24 %
MCH: 29 pg (ref 26.0–34.0)
MCHC: 33.2 g/dL (ref 30.0–36.0)
MCV: 87.6 fL (ref 78.0–100.0)
MONO ABS: 0.6 10*3/uL (ref 0.1–1.0)
MONOS PCT: 12 %
Neutro Abs: 3.1 10*3/uL (ref 1.7–7.7)
Neutrophils Relative %: 61 %
PLATELETS: 131 10*3/uL — AB (ref 150–400)
RBC: 4.2 MIL/uL (ref 3.87–5.11)
RDW: 14 % (ref 11.5–15.5)
WBC: 5 10*3/uL (ref 4.0–10.5)

## 2015-06-02 LAB — BASIC METABOLIC PANEL
Anion gap: 8 (ref 5–15)
BUN: 14 mg/dL (ref 6–20)
CHLORIDE: 105 mmol/L (ref 101–111)
CO2: 26 mmol/L (ref 22–32)
CREATININE: 0.76 mg/dL (ref 0.44–1.00)
Calcium: 9.7 mg/dL (ref 8.9–10.3)
GFR calc Af Amer: >60 mL/min (ref >60)
GFR calc non Af Amer: >60 mL/min (ref >60)
GLUCOSE: 171 mg/dL — AB (ref 65–99)
Potassium: 3.4 mmol/L — ABNORMAL LOW (ref 3.5–5.1)
SODIUM: 139 mmol/L (ref 135–145)

## 2015-06-02 LAB — GLUCOSE, CAPILLARY: Glucose-Capillary: 171 mg/dL — ABNORMAL HIGH (ref 65–99)

## 2015-06-02 LAB — SURGICAL PCR SCREEN
MRSA, PCR: NEGATIVE
Staphylococcus aureus: NEGATIVE

## 2015-06-02 LAB — ABO/RH: ABO/RH(D): A POS

## 2015-06-02 LAB — PROTIME-INR
INR: 1.12 (ref 0.00–1.49)
PROTHROMBIN TIME: 14.6 s (ref 11.6–15.2)

## 2015-06-02 LAB — TYPE AND SCREEN
ABO/RH(D): A POS
Antibody Screen: NEGATIVE

## 2015-06-02 NOTE — Pre-Procedure Instructions (Signed)
CHERLY ERNO  06/02/2015      [id number] - [place], [address] [place] [address] Phone: [phone] Fax: [phone]    Your procedure is scheduled on  Wednesday  06/10/15  Report to [facility] Admitting at 630 A.M.  Call this number if you have problems the morning of surgery:  [phone]   Remember:  Do not eat food or drink liquids after midnight.  Take these medicines the morning of surgery with A SIP OF WATER   OMEPRAZOLE (PRILOSEC)  (STOP MULTIVITAMIN, MELOXICAM/ MOBIC, FISH OIL OMEGA 3, VITAMINS, CINNAMON,  HERBAL MEDICINES, NO ASPIRIN, IBUPROFEN/ ADVIL/ MOTRIN, GOODY POWDERS/ BC'S) How to Manage Your Diabetes Before Surgery   Why is it important to control my blood sugar before and after surgery?   Improving blood sugar levels before and after surgery helps healing and can limit problems.  A way of improving blood sugar control is eating a healthy diet by:  - Eating less sugar and carbohydrates  - Increasing activity/exercise  - Talk with your doctor about reaching your blood sugar goals  High blood sugars (greater than 180 mg/dL) can raise your risk of infections and slow down your recovery so you will need to focus on controlling your diabetes during the weeks before surgery.  Make sure that the doctor who takes care of your diabetes knows about your planned surgery including the date and location.  How do I manage my blood sugars before surgery?   Check your blood sugar at least 4 times a day, 2 days before surgery to make sure that they are not too high or low.   Check your blood sugar the morning of your surgery when you wake up and every 2               hours until you get to the Short-Stay unit.  If your blood sugar is less than 70 mg/dL, you will need to treat for low blood sugar by:  Treat a low blood sugar (less than 70 mg/dL) with 1/2  cup of clear juice (cranberry or apple), 4 glucose tablets, OR glucose gel.  Recheck blood sugar in 15 minutes after treatment (to make sure it is greater than 70 mg/dL).  If blood sugar is not greater than 70 mg/dL on re-check, call [phone] for further instructions.   Report your blood sugar to the Short-Stay nurse when you get to Short-Stay.  References:  [facility], 2007 "How to Manage your Diabetes Before and After Surgery".  What do I do about my diabetes medications?   Do not take oral diabetes medicines (pills) the morning of surgery.  THE NIGHT BEFORE SURGERY, take 24-28  units of LEVEMIR  Insulin.     Do not take other diabetes injectables the day of surgery including Byetta, Victoza, Bydureon, and Trulicity.    For patients with "Insulin Pumps":  Contact your diabetes doctor for specific instructions before surgery.   Decrease basal insulin rates by 20% at midnight the night before surgery.  Note that if your surgery is planned to be longer than 2 hours, your insulin pump will be removed and intravenous (IV) insulin will be started and managed by the nurses and anesthesiologist.  You will be able to restart your insulin pump once you are awake and able to manage it.  Make sure to  bring insulin pump supplies to the hospital with you in case your site needs to be changed.         Do not wear jewelry, make-up or nail polish.  Do not wear lotions, powders, or perfumes.  You may wear deodorant.  Do not shave 48 hours prior to surgery.  Men may shave face and neck.  Do not bring valuables to the hospital.  [facility] is not responsible for any belongings or valuables.  Contacts, dentures or bridgework may not be worn into surgery.  Leave your suitcase in the car.  After surgery it may be brought to your room.  For patients admitted to the hospital, discharge time will be determined by your treatment team.  Patients discharged the  day of surgery will not be allowed to drive home.   Name and phone number of your driver:   Special instructions:  SEE SHOWER INSTRUCTIOS  Please read over the following fact sheets that you were given. Pain Booklet, Coughing and Deep Breathing, MRSA Information and Surgical Site Infection Prevention

## 2015-06-02 NOTE — Telephone Encounter (Signed)
Patient called and left message and stated that ever since changing her to [place], due to her insurance, her blood sugars stay high.  Tried calling patient, LMOM to return call.

## 2015-06-02 NOTE — Telephone Encounter (Signed)
Patient called back. This morning it was back down to 88. Patient is getting ready to have back surgery and worried it is not going to stay good. Has been running 143-180. Patient believes it has gotten into her system good now and will wait and monitor.

## 2015-06-03 LAB — HEMOGLOBIN A1C
Hgb A1c MFr Bld: 8.8 % — ABNORMAL HIGH (ref 4.8–5.6)
MEAN PLASMA GLUCOSE: 206 mg/dL

## 2015-06-09 MED ORDER — DEXAMETHASONE SODIUM PHOSPHATE 10 MG/ML IJ SOLN
10.0000 mg | INTRAMUSCULAR | Status: DC
  Filled 2015-06-09: qty 1

## 2015-06-09 MED ORDER — CEFAZOLIN SODIUM-DEXTROSE 2-3 GM-% IV SOLR
2.0000 g | INTRAVENOUS | Status: AC
  Administered 2015-06-10: 2 g via INTRAVENOUS
  Filled 2015-06-09: qty 50

## 2015-06-10 ENCOUNTER — Encounter (HOSPITAL_COMMUNITY): Payer: Self-pay | Admitting: *Deleted

## 2015-06-10 ENCOUNTER — Encounter (HOSPITAL_COMMUNITY)
Admission: RE | Disposition: A | Discharge status: Home / Self Care | Payer: Self-pay | Source: Ambulatory Visit | Attending: Neurological Surgery

## 2015-06-10 ENCOUNTER — Inpatient Hospital Stay (HOSPITAL_COMMUNITY): Payer: Medicare Other | Admitting: Certified Registered Nurse Anesthetist

## 2015-06-10 ENCOUNTER — Inpatient Hospital Stay (HOSPITAL_COMMUNITY)
Admission: RE | Admit: 2015-06-10 | Discharge: 2015-06-11 | DRG: 460 | Disposition: A | Discharge status: Home / Self Care | Payer: Medicare Other | Source: Ambulatory Visit | Attending: Neurological Surgery | Admitting: Neurological Surgery

## 2015-06-10 ENCOUNTER — Inpatient Hospital Stay (HOSPITAL_COMMUNITY): Payer: Medicare Other

## 2015-06-10 DIAGNOSIS — M1990 Unspecified osteoarthritis, unspecified site: Secondary | ICD-10-CM | POA: Diagnosis not present

## 2015-06-10 DIAGNOSIS — Z794 Long term (current) use of insulin: Secondary | ICD-10-CM

## 2015-06-10 DIAGNOSIS — Z91018 Allergy to other foods: Secondary | ICD-10-CM | POA: Diagnosis not present

## 2015-06-10 DIAGNOSIS — Z886 Allergy status to analgesic agent status: Secondary | ICD-10-CM | POA: Diagnosis not present

## 2015-06-10 DIAGNOSIS — Z887 Allergy status to serum and vaccine status: Secondary | ICD-10-CM

## 2015-06-10 DIAGNOSIS — E1122 Type 2 diabetes mellitus with diabetic chronic kidney disease: Secondary | ICD-10-CM | POA: Diagnosis present

## 2015-06-10 DIAGNOSIS — Z91040 Latex allergy status: Secondary | ICD-10-CM

## 2015-06-10 DIAGNOSIS — M4806 Spinal stenosis, lumbar region: Principal | ICD-10-CM | POA: Diagnosis present

## 2015-06-10 DIAGNOSIS — Z419 Encounter for procedure for purposes other than remedying health state, unspecified: Secondary | ICD-10-CM

## 2015-06-10 DIAGNOSIS — I129 Hypertensive chronic kidney disease with stage 1 through stage 4 chronic kidney disease, or unspecified chronic kidney disease: Secondary | ICD-10-CM | POA: Diagnosis present

## 2015-06-10 DIAGNOSIS — Z9889 Other specified postprocedural states: Secondary | ICD-10-CM | POA: Diagnosis not present

## 2015-06-10 DIAGNOSIS — Z90710 Acquired absence of both cervix and uterus: Secondary | ICD-10-CM | POA: Diagnosis not present

## 2015-06-10 DIAGNOSIS — M4316 Spondylolisthesis, lumbar region: Secondary | ICD-10-CM | POA: Diagnosis present

## 2015-06-10 DIAGNOSIS — Z79899 Other long term (current) drug therapy: Secondary | ICD-10-CM

## 2015-06-10 DIAGNOSIS — Z981 Arthrodesis status: Secondary | ICD-10-CM

## 2015-06-10 DIAGNOSIS — M545 Low back pain: Secondary | ICD-10-CM | POA: Diagnosis not present

## 2015-06-10 DIAGNOSIS — N181 Chronic kidney disease, stage 1: Secondary | ICD-10-CM | POA: Diagnosis present

## 2015-06-10 DIAGNOSIS — M5116 Intervertebral disc disorders with radiculopathy, lumbar region: Secondary | ICD-10-CM | POA: Diagnosis present

## 2015-06-10 DIAGNOSIS — M549 Dorsalgia, unspecified: Secondary | ICD-10-CM | POA: Diagnosis not present

## 2015-06-10 DIAGNOSIS — K219 Gastro-esophageal reflux disease without esophagitis: Secondary | ICD-10-CM | POA: Diagnosis not present

## 2015-06-10 DIAGNOSIS — M5126 Other intervertebral disc displacement, lumbar region: Secondary | ICD-10-CM | POA: Diagnosis not present

## 2015-06-10 HISTORY — PX: MAXIMUM ACCESS (MAS)POSTERIOR LUMBAR INTERBODY FUSION (PLIF) 1 LEVEL: SHX6368

## 2015-06-10 HISTORY — PX: BACK SURGERY: SHX140

## 2015-06-10 LAB — GLUCOSE, CAPILLARY
GLUCOSE-CAPILLARY: 131 mg/dL — AB (ref 65–99)
GLUCOSE-CAPILLARY: 105 mg/dL — AB (ref 65–99)
Glucose-Capillary: 262 mg/dL — ABNORMAL HIGH (ref 65–99)
Glucose-Capillary: 233 mg/dL — ABNORMAL HIGH (ref 65–99)

## 2015-06-10 SURGERY — FOR MAXIMUM ACCESS (MAS) POSTERIOR LUMBAR INTERBODY FUSION (PLIF) 1 LEVEL
Anesthesia: General | Site: Spine Lumbar | Laterality: Left

## 2015-06-10 MED ORDER — SODIUM CHLORIDE 0.9% FLUSH: 3.0000 mL | INTRAVENOUS | Status: DC | PRN

## 2015-06-10 MED ORDER — ACETAMINOPHEN 325 MG PO TABS: 650.0000 mg | ORAL_TABLET | ORAL | Status: DC | PRN

## 2015-06-10 MED ORDER — PHENYLEPHRINE HCL 10 MG/ML IJ SOLN
10.0000 mg | INTRAVENOUS | Status: DC | PRN
  Administered 2015-06-10: 20 ug/min via INTRAVENOUS

## 2015-06-10 MED ORDER — MORPHINE SULFATE (PF) 2 MG/ML IV SOLN
1.0000 mg | INTRAVENOUS | Status: DC | PRN
  Administered 2015-06-10: 2 mg via INTRAVENOUS
  Filled 2015-06-10: qty 1

## 2015-06-10 MED ORDER — THROMBIN 5000 UNITS EX SOLR
OROMUCOSAL | Status: DC | PRN
  Administered 2015-06-10: 5 mL via TOPICAL

## 2015-06-10 MED ORDER — FENTANYL CITRATE (PF) 100 MCG/2ML IJ SOLN
INTRAMUSCULAR | Status: DC | PRN
  Administered 2015-06-10: 50 ug via INTRAVENOUS
  Administered 2015-06-10: 25 ug via INTRAVENOUS
  Administered 2015-06-10: 75 ug via INTRAVENOUS
  Administered 2015-06-10 (×2): 25 ug via INTRAVENOUS

## 2015-06-10 MED ORDER — 0.9 % SODIUM CHLORIDE (POUR BTL) OPTIME
TOPICAL | Status: DC | PRN
  Administered 2015-06-10: 1000 mL

## 2015-06-10 MED ORDER — CELECOXIB 200 MG PO CAPS
200.0000 mg | ORAL_CAPSULE | Freq: Two times a day (BID) | ORAL | Status: DC
  Administered 2015-06-10 – 2015-06-11 (×2): 200 mg via ORAL
  Filled 2015-06-10 (×2): qty 1

## 2015-06-10 MED ORDER — PROPOFOL 10 MG/ML IV BOLUS
INTRAVENOUS | Status: DC | PRN
  Administered 2015-06-10: 100 mg via INTRAVENOUS

## 2015-06-10 MED ORDER — PHENOL 1.4 % MT LIQD: 1.0000 | OROMUCOSAL | Status: DC | PRN

## 2015-06-10 MED ORDER — BUPIVACAINE HCL (PF) 0.25 % IJ SOLN
INTRAMUSCULAR | Status: DC | PRN
  Administered 2015-06-10: 8 mL

## 2015-06-10 MED ORDER — LIDOCAINE HCL (CARDIAC) 20 MG/ML IV SOLN
INTRAVENOUS | Status: AC
  Filled 2015-06-10: qty 5

## 2015-06-10 MED ORDER — HYDROMORPHONE HCL 1 MG/ML IJ SOLN
INTRAMUSCULAR | Status: AC
  Administered 2015-06-10: 0.5 mg via INTRAVENOUS
  Filled 2015-06-10: qty 1

## 2015-06-10 MED ORDER — ONDANSETRON HCL 4 MG/2ML IJ SOLN: 4.0000 mg | INTRAMUSCULAR | Status: DC | PRN

## 2015-06-10 MED ORDER — SUCCINYLCHOLINE CHLORIDE 20 MG/ML IJ SOLN
INTRAMUSCULAR | Status: AC
  Filled 2015-06-10: qty 1

## 2015-06-10 MED ORDER — HYDROMORPHONE HCL 1 MG/ML IJ SOLN
INTRAMUSCULAR | Status: AC
  Filled 2015-06-10: qty 1

## 2015-06-10 MED ORDER — INSULIN ASPART 100 UNIT/ML ~~LOC~~ SOLN: 0.0000 [IU] | Freq: Three times a day (TID) | SUBCUTANEOUS | Status: DC

## 2015-06-10 MED ORDER — ONDANSETRON HCL 4 MG/2ML IJ SOLN
INTRAMUSCULAR | Status: AC
  Filled 2015-06-10: qty 2

## 2015-06-10 MED ORDER — PROPOFOL 500 MG/50ML IV EMUL
INTRAVENOUS | Status: DC | PRN
  Administered 2015-06-10: 50 ug/kg/min via INTRAVENOUS

## 2015-06-10 MED ORDER — CEFAZOLIN SODIUM 1-5 GM-% IV SOLN
1.0000 g | Freq: Three times a day (TID) | INTRAVENOUS | Status: AC
  Administered 2015-06-10 (×2): 1 g via INTRAVENOUS
  Filled 2015-06-10 (×2): qty 50

## 2015-06-10 MED ORDER — HYDROMORPHONE HCL 1 MG/ML IJ SOLN
0.2500 mg | INTRAMUSCULAR | Status: DC | PRN
  Administered 2015-06-10 (×3): 0.5 mg via INTRAVENOUS

## 2015-06-10 MED ORDER — INSULIN ASPART 100 UNIT/ML ~~LOC~~ SOLN
0.0000 [IU] | Freq: Every day | SUBCUTANEOUS | Status: DC
  Administered 2015-06-10: 2 [IU] via SUBCUTANEOUS

## 2015-06-10 MED ORDER — METFORMIN HCL 500 MG PO TABS
1000.0000 mg | ORAL_TABLET | Freq: Every day | ORAL | Status: DC
  Administered 2015-06-11: 1000 mg via ORAL
  Filled 2015-06-10: qty 2

## 2015-06-10 MED ORDER — SODIUM CHLORIDE 0.9 % IR SOLN
Status: DC | PRN
  Administered 2015-06-10: 500 mL

## 2015-06-10 MED ORDER — DEXAMETHASONE SODIUM PHOSPHATE 4 MG/ML IJ SOLN
INTRAMUSCULAR | Status: AC
  Filled 2015-06-10: qty 1

## 2015-06-10 MED ORDER — SUCCINYLCHOLINE CHLORIDE 20 MG/ML IJ SOLN
INTRAMUSCULAR | Status: DC | PRN
  Administered 2015-06-10: 100 mg via INTRAVENOUS

## 2015-06-10 MED ORDER — VANCOMYCIN HCL 1000 MG IV SOLR
INTRAVENOUS | Status: AC
  Filled 2015-06-10: qty 1000

## 2015-06-10 MED ORDER — ADULT MULTIVITAMIN W/MINERALS CH
1.0000 | ORAL_TABLET | Freq: Every day | ORAL | Status: DC
Start: 2015-06-10 — End: 2015-06-11
  Administered 2015-06-10 – 2015-06-11 (×2): 1 via ORAL
  Filled 2015-06-10 (×3): qty 1

## 2015-06-10 MED ORDER — METHOCARBAMOL 1000 MG/10ML IJ SOLN: 500.0000 mg | Freq: Four times a day (QID) | INTRAVENOUS | Status: DC | PRN

## 2015-06-10 MED ORDER — SODIUM CHLORIDE 0.9% FLUSH
3.0000 mL | Freq: Two times a day (BID) | INTRAVENOUS | Status: DC
  Administered 2015-06-10: 3 mL via INTRAVENOUS

## 2015-06-10 MED ORDER — ACETAMINOPHEN 650 MG RE SUPP: 650.0000 mg | RECTAL | Status: DC | PRN

## 2015-06-10 MED ORDER — MIDAZOLAM HCL 5 MG/5ML IJ SOLN
INTRAMUSCULAR | Status: DC | PRN
  Administered 2015-06-10: 1 mg via INTRAVENOUS

## 2015-06-10 MED ORDER — THROMBIN 20000 UNITS EX SOLR
CUTANEOUS | Status: DC | PRN
  Administered 2015-06-10: 20 mL via TOPICAL

## 2015-06-10 MED ORDER — MENTHOL 3 MG MT LOZG: 1.0000 | LOZENGE | OROMUCOSAL | Status: DC | PRN

## 2015-06-10 MED ORDER — ACETAMINOPHEN 10 MG/ML IV SOLN
INTRAVENOUS | Status: AC
  Administered 2015-06-10: 1000 mg via INTRAVENOUS
  Filled 2015-06-10: qty 100

## 2015-06-10 MED ORDER — LIDOCAINE HCL (CARDIAC) 20 MG/ML IV SOLN
INTRAVENOUS | Status: DC | PRN
  Administered 2015-06-10: 80 mg via INTRAVENOUS

## 2015-06-10 MED ORDER — PROPOFOL 10 MG/ML IV BOLUS
INTRAVENOUS | Status: AC
  Filled 2015-06-10: qty 40

## 2015-06-10 MED ORDER — OXYCODONE-ACETAMINOPHEN 5-325 MG PO TABS
1.0000 | ORAL_TABLET | ORAL | Status: DC | PRN
  Administered 2015-06-10: 2 via ORAL
  Administered 2015-06-10: 1 via ORAL
  Administered 2015-06-11 (×2): 2 via ORAL
  Filled 2015-06-10 (×3): qty 2
  Filled 2015-06-10: qty 1

## 2015-06-10 MED ORDER — METHOCARBAMOL 500 MG PO TABS
500.0000 mg | ORAL_TABLET | Freq: Four times a day (QID) | ORAL | Status: DC | PRN
  Administered 2015-06-10 – 2015-06-11 (×2): 500 mg via ORAL
  Filled 2015-06-10 (×2): qty 1

## 2015-06-10 MED ORDER — ROCURONIUM BROMIDE 50 MG/5ML IV SOLN
INTRAVENOUS | Status: AC
  Filled 2015-06-10: qty 1

## 2015-06-10 MED ORDER — VANCOMYCIN HCL 1000 MG IV SOLR
INTRAVENOUS | Status: DC | PRN
  Administered 2015-06-10: 1000 mg via TOPICAL

## 2015-06-10 MED ORDER — MIDAZOLAM HCL 2 MG/2ML IJ SOLN
INTRAMUSCULAR | Status: AC
  Filled 2015-06-10: qty 2

## 2015-06-10 MED ORDER — FENTANYL CITRATE (PF) 250 MCG/5ML IJ SOLN
INTRAMUSCULAR | Status: AC
  Filled 2015-06-10: qty 5

## 2015-06-10 MED ORDER — LACTATED RINGERS IV SOLN
INTRAVENOUS | Status: DC | PRN
  Administered 2015-06-10 (×2): via INTRAVENOUS

## 2015-06-10 MED ORDER — PROMETHAZINE HCL 25 MG/ML IJ SOLN: 6.2500 mg | INTRAMUSCULAR | Status: DC | PRN

## 2015-06-10 MED ORDER — ONDANSETRON HCL 4 MG/2ML IJ SOLN
INTRAMUSCULAR | Status: DC | PRN
  Administered 2015-06-10: 4 mg via INTRAVENOUS

## 2015-06-10 MED ORDER — POTASSIUM CHLORIDE IN NACL 20-0.9 MEQ/L-% IV SOLN
INTRAVENOUS | Status: DC
  Filled 2015-06-10 (×3): qty 1000

## 2015-06-10 SURGICAL SUPPLY — 74 items
BAG DECANTER FOR FLEXI CONT (MISCELLANEOUS) ×2 IMPLANT
BENZOIN TINCTURE PRP APPL 2/3 (GAUZE/BANDAGES/DRESSINGS) ×2 IMPLANT
BIT DRILL PLIF MAS 5.0MM DISP (DRILL) ×1 IMPLANT
BLADE CLIPPER SURG (BLADE) IMPLANT
BONE MATRIX OSTEOCEL PRO SM (Bone Implant) ×4 IMPLANT
BUR MATCHSTICK NEURO 3.0 LAGG (BURR) ×2 IMPLANT
CAGE COROENT PEEK 8X930MM-8 (Cage) ×2 IMPLANT
CANISTER SUCT 3000ML PPV (MISCELLANEOUS) ×2 IMPLANT
CLIP NEUROVISION LG (CLIP) ×2 IMPLANT
CONT SPEC 4OZ CLIKSEAL STRL BL (MISCELLANEOUS) ×2 IMPLANT
COVER BACK TABLE 24X17X13 BIG (DRAPES) IMPLANT
COVER BACK TABLE 60X90IN (DRAPES) ×2 IMPLANT
DERMABOND ADVANCED (GAUZE/BANDAGES/DRESSINGS) ×1
DERMABOND ADVANCED .7 DNX12 (GAUZE/BANDAGES/DRESSINGS) ×1 IMPLANT
DRAPE C-ARM 42X72 X-RAY (DRAPES) ×2 IMPLANT
DRAPE C-ARMOR (DRAPES) ×2 IMPLANT
DRAPE LAPAROTOMY 100X72X124 (DRAPES) ×2 IMPLANT
DRAPE MICROSCOPE LEICA (MISCELLANEOUS) ×2 IMPLANT
DRAPE POUCH INSTRU U-SHP 10X18 (DRAPES) ×2 IMPLANT
DRAPE SURG 17X23 STRL (DRAPES) ×2 IMPLANT
DRILL PLIF MAS 5.0MM DISP (DRILL) ×2
DRSG OPSITE 4X5.5 SM (GAUZE/BANDAGES/DRESSINGS) ×2 IMPLANT
DRSG OPSITE POSTOP 4X6 (GAUZE/BANDAGES/DRESSINGS) ×2 IMPLANT
DURAPREP 26ML APPLICATOR (WOUND CARE) ×2 IMPLANT
ELECT REM PT RETURN 9FT ADLT (ELECTROSURGICAL) ×2
ELECTRODE REM PT RTRN 9FT ADLT (ELECTROSURGICAL) ×1 IMPLANT
EVACUATOR 1/8 PVC DRAIN (DRAIN) IMPLANT
GAUZE SPONGE 4X4 16PLY XRAY LF (GAUZE/BANDAGES/DRESSINGS) IMPLANT
GLOVE BIO SURGEON STRL SZ8 (GLOVE) IMPLANT
GLOVE BIOGEL PI IND STRL 7.0 (GLOVE) ×5 IMPLANT
GLOVE BIOGEL PI IND STRL 7.5 (GLOVE) ×1 IMPLANT
GLOVE BIOGEL PI IND STRL 8.5 (GLOVE) IMPLANT
GLOVE BIOGEL PI INDICATOR 7.0 (GLOVE) ×5
GLOVE BIOGEL PI INDICATOR 7.5 (GLOVE) ×1
GLOVE BIOGEL PI INDICATOR 8.5 (GLOVE)
GLOVE SURG SS PI 6.5 STRL IVOR (GLOVE) ×6 IMPLANT
GLOVE SURG SS PI 7.5 STRL IVOR (GLOVE) ×2 IMPLANT
GLOVE SURG SS PI 8.0 STRL IVOR (GLOVE) ×8 IMPLANT
GOWN STRL REUS W/ TWL LRG LVL3 (GOWN DISPOSABLE) ×2 IMPLANT
GOWN STRL REUS W/ TWL XL LVL3 (GOWN DISPOSABLE) ×7 IMPLANT
GOWN STRL REUS W/TWL 2XL LVL3 (GOWN DISPOSABLE) IMPLANT
GOWN STRL REUS W/TWL LRG LVL3 (GOWN DISPOSABLE) ×2
GOWN STRL REUS W/TWL XL LVL3 (GOWN DISPOSABLE) ×7
HEMOSTAT POWDER KIT SURGIFOAM (HEMOSTASIS) ×2 IMPLANT
KIT BASIN OR (CUSTOM PROCEDURE TRAY) ×2 IMPLANT
KIT INFUSE XX SMALL 0.7CC (Orthopedic Implant) ×2 IMPLANT
KIT ROOM TURNOVER OR (KITS) ×2 IMPLANT
MILL MEDIUM DISP (BLADE) ×2 IMPLANT
MODULE NVM5 NEXT GEN EMG (NEEDLE) ×2 IMPLANT
NEEDLE HYPO 25X1 1.5 SAFETY (NEEDLE) ×2 IMPLANT
NS IRRIG 1000ML POUR BTL (IV SOLUTION) ×2 IMPLANT
PACK LAMINECTOMY NEURO (CUSTOM PROCEDURE TRAY) ×2 IMPLANT
PAD ARMBOARD 7.5X6 YLW CONV (MISCELLANEOUS) ×10 IMPLANT
PUTTY BONE ATTRAX 5CC STRIP (Putty) ×2 IMPLANT
ROD PREBENT 45MM LUMBAR (Rod) ×4 IMPLANT
RUBBERBAND STERILE (MISCELLANEOUS) ×4 IMPLANT
SCREW LOCK (Screw) ×4 IMPLANT
SCREW LOCK FXNS SPNE MAS PL (Screw) ×4 IMPLANT
SCREW PLIF MAS 5.0X35 (Screw) ×2 IMPLANT
SCREW SHANK 5.0X35 (Screw) ×6 IMPLANT
SCREW TULIP 5.5 (Screw) ×6 IMPLANT
SPONGE LAP 4X18 X RAY DECT (DISPOSABLE) IMPLANT
SPONGE SURGIFOAM ABS GEL 100 (HEMOSTASIS) ×2 IMPLANT
STRIP CLOSURE SKIN 1/2X4 (GAUZE/BANDAGES/DRESSINGS) ×4 IMPLANT
SUT VIC AB 0 CT1 18XCR BRD8 (SUTURE) ×2 IMPLANT
SUT VIC AB 0 CT1 8-18 (SUTURE) ×2
SUT VIC AB 2-0 CP2 18 (SUTURE) ×4 IMPLANT
SUT VIC AB 3-0 SH 8-18 (SUTURE) ×4 IMPLANT
SYR 3ML LL SCALE MARK (SYRINGE) IMPLANT
TOWEL OR 17X24 6PK STRL BLUE (TOWEL DISPOSABLE) ×2 IMPLANT
TOWEL OR 17X26 10 PK STRL BLUE (TOWEL DISPOSABLE) ×2 IMPLANT
TRAY FOLEY CATH SILVER 16FR LF (SET/KITS/TRAYS/PACK) ×2 IMPLANT
TRAY FOLEY W/METER SILVER 14FR (SET/KITS/TRAYS/PACK) IMPLANT
WATER STERILE IRR 1000ML POUR (IV SOLUTION) ×2 IMPLANT

## 2015-06-10 NOTE — Anesthesia Preprocedure Evaluation (Addendum)
Anesthesia Evaluation  Patient identified by MRN, date of birth, ID band Patient awake    Reviewed: Allergy & Precautions, NPO status , Patient's Chart, lab work & pertinent test results  Airway Mallampati: II  TM Distance: >3 FB Neck ROM: Full    Dental  (+) Teeth Intact   Pulmonary neg pulmonary ROS,    breath sounds clear to auscultation       Cardiovascular hypertension,  Rhythm:Regular Rate:Normal + Systolic murmurs    Neuro/Psych negative neurological ROS     GI/Hepatic negative GI ROS, Neg liver ROS, GERD  ,  Endo/Other  diabetes, Type 1, Insulin Dependent  Renal/GU      Musculoskeletal  (+) Arthritis ,   Abdominal   Peds  Hematology negative hematology ROS (+)   Anesthesia Other Findings   Reproductive/Obstetrics                            Anesthesia Physical Anesthesia Plan  ASA: II  Anesthesia Plan: General   Post-op Pain Management:    Induction: Intravenous  Airway Management Planned: Oral ETT  Additional Equipment:   Intra-op Plan:   Post-operative Plan: Extubation in OR  Informed Consent: I have reviewed the patients History and Physical, chart, labs and discussed the procedure including the risks, benefits and alternatives for the proposed anesthesia with the patient or authorized representative who has indicated his/her understanding and acceptance.   Dental advisory given  Plan Discussed with: CRNA and Surgeon  Anesthesia Plan Comments: (Has a 3/6systolic murmur, Echo in  Remote past not available. Has had no chest pain or SOB )       Anesthesia Quick Evaluation

## 2015-06-10 NOTE — Transfer of Care (Signed)
Immediate Anesthesia Transfer of Care Note  Patient: [name]  Procedure(s) Performed: Procedure(s): FOR MAXIMUM ACCESS (MAS) POSTERIOR LUMBAR INTERBODY FUSION (PLIF) LUMBAR THREE-FOUR EXTRAFORAMINAL MICRODISCECTOMY LUMBAR FIVE-SACRAL ONE LEFT (Left)  Patient Location: PACU  Anesthesia Type:General  Level of Consciousness: awake, alert  and patient cooperative  Airway & Oxygen Therapy: Patient Spontanous Breathing and Patient connected to face mask oxygen  Post-op Assessment: Report given to RN and Post -op Vital signs reviewed and stable  Post vital signs: Reviewed and stable  Last Vitals:  Filed Vitals:   06/10/15 0659 06/10/15 1235  BP: 144/64 149/72  Pulse: 70 84  Temp: 36.8 C 36.6 C  Resp: 20 19    Complications: No apparent anesthesia complications

## 2015-06-10 NOTE — H&P (Signed)
Subjective: Patient is a 70 y.o. female admitted for PLIF. Onset of symptoms was several months ago, gradually worsening since that time.  The pain is rated severe, and is located at the across the lower back and radiates to leg. The pain is described as aching and occurs all day. The symptoms have been progressive. Symptoms are exacerbated by exercise. MRI or CT showed spondylolisthesis L3-4 with foraminal stenosis L5-S1   Past Medical History  Diagnosis Date  . Allergy   . Arthritis   . GERD (gastroesophageal reflux disease)   . Diabetes mellitus   . Epistaxis   . Cramp of limb   . Neoplasm of uncertain behavior of skin   . Dysuria   . Nonspecific elevation of levels of transaminase or lactic acid dehydrogenase (LDH)   . Dysphagia, unspecified(787.20)   . Unspecified essential hypertension   . Pain in joint, shoulder region   . Lumbago   . Other and unspecified hyperlipidemia   . Osteoarthrosis, unspecified whether generalized or localized, unspecified site   . Postablative ovarian failure   . Type 2 diabetes mellitus without complication ([place])   . Paresthesias 04/01/2015  . Heart murmur     NO CARDIOLOGIST  DX FOR YEARS ASYMPTOMATIC  . Chronic kidney disease, stage I     DR Bryon Lions    Past Surgical History  Procedure Laterality Date  . Vaginal hysterectomy  1997    Dr Rande Lawman  . Colonoscopy    . Upper gastrointestinal endoscopy    . Tubal ligation  1982    Dr Connye Burkitt    Prior to Admission medications   Medication Sig Start Date End Date Taking? Authorizing Provider  calcium carbonate (OS-CAL) 600 MG TABS Take 600 mg by mouth 2 (two) times daily with a meal.     Yes Historical Provider, MD  cholecalciferol (VITAMIN D) 1000 UNITS tablet Take 1,000 Units by mouth daily.     Yes Historical Provider, MD  Cinnamon 500 MG TABS Take 1,000 mg by mouth daily.    Yes Historical Provider, MD  ciprofloxacin (CIPRO) 250 MG tablet Take 250 mg by mouth 2 (two) times daily as  needed (to prevent UTIs). Take one tablet after intercourse to prevent urinary tract infection   Yes Historical Provider, MD  Cyanocobalamin (VITAMIN B 12 PO) Take 1,000 mcg by mouth daily.     Yes Historical Provider, MD  fish oil-omega-3 fatty acids 1000 MG capsule Take 1 g by mouth daily.     Yes Historical Provider, MD  fluticasone (FLONASE) 50 MCG/ACT nasal spray Place 2 sprays into both nostrils 2 (two) times daily. Decrease to 2 sprays/nostril daily after 5 days Patient taking differently: Place 2 sprays into both nostrils daily as needed for allergies.  12/10/13  Yes Liam Graham, PA-C  insulin detemir (LEVEMIR) 100 UNIT/ML injection Inject 30-35 units into skin at bedtime to control blood sugar 05/13/15  Yes Estill Dooms, MD  loratadine (CLARITIN) 10 MG tablet Take 10 mg by mouth daily.     Yes Historical Provider, MD  meloxicam (MOBIC) 7.5 MG tablet TAKE 1 TABLET BY MOUTH EVERY DAY FOR ARTHRITIS OR PAIN 04/13/15  Yes Estill Dooms, MD  metFORMIN (GLUCOPHAGE) 1000 MG tablet Take one tablet by mouth twice daily to treat diabetes 05/05/15  Yes Estill Dooms, MD  Multiple Vitamins-Minerals (MULTIVITAMIN WITH MINERALS) tablet Take 1 tablet by mouth daily.     Yes Historical Provider, MD  omeprazole (PRILOSEC) 20 MG  capsule TAKE ONE CAPSULE BY MOUTH DAILY 03/23/15  Yes Estill Dooms, MD  AMBULATORY NON FORMULARY MEDICATION One Touch Ultra Blue Test Strips Sig: Use to test blood sugar three times daily to control blood sugar Dx: E11.9 09/29/14   Estill Dooms, MD  glucose blood test strip Use as instructed to test blood sugar three times daily  Dx  250.00 09/04/13   Estill Dooms, MD  Insulin Pen Needle (B-D UF III MINI PEN NEEDLES) 31G X 5 MM MISC Use daily with the administration of insulin E11.9 03/31/15   Estill Dooms, MD  Insulin Syringe-Needle U-100 (INSULIN SYRINGE .5CC/31GX5/16") 31G X 5/16" 0.5 ML MISC Use daily with the administration of insulin E11.9 03/31/15   Estill Dooms, MD   traMADol (ULTRAM) 50 MG tablet Take 1 tablet (50 mg total) by mouth every 6 (six) hours as needed. Patient not taking: Reported on 05/28/2015 05/10/15   Orpah Greek, MD   Allergies  Allergen Reactions  . Kiwi Extract Anaphylaxis  . Tdap [Diphth-Acell Pertussis-Tetanus] Swelling    Swelling at injection site, gets very hot  . Tramadol Nausea And Vomiting  . Latex Itching, Dermatitis and Rash    Social History  Substance Use Topics  . Smoking status: Never Smoker   . Smokeless tobacco: Never Used  . Alcohol Use: No    Family History  Problem Relation Age of Onset  . Cancer Father   . Arthritis Sister   . Arthritis Brother   . Heart disease Maternal Grandmother   . Heart disease Maternal Grandfather   . Heart disease Paternal Grandmother   . Heart disease Paternal Grandfather   . Liver cancer Brother      Review of Systems  Positive ROS: neg  All other systems have been reviewed and were otherwise negative with the exception of those mentioned in the HPI and as above.  Objective: Vital signs in last 24 hours:  neg  General Appearance: Alert, cooperative, no distress, appears stated age Head: Normocephalic, without obvious abnormality, atraumatic Eyes: PERRL, conjunctiva/corneas clear, EOM's intact    Neck: Supple, symmetrical, trachea midline Back: Symmetric, no curvature, ROM normal, no CVA tenderness Lungs:  respirations unlabored Heart: Regular rate and rhythm Abdomen: Soft, non-tender Extremities: Extremities normal, atraumatic, no cyanosis or edema Pulses: 2+ and symmetric all extremities Skin: Skin color, texture, turgor normal, no rashes or lesions  NEUROLOGIC:   Mental status: Alert and oriented x4,  no aphasia, good attention span, fund of knowledge, and memory Motor Exam - grossly normal Sensory Exam - grossly normal Reflexes: 1+ Coordination - grossly normal Gait - grossly normal Balance - grossly normal Cranial Nerves: I: smell Not tested   II: visual acuity  OS: nl    OD: nl  II: visual fields Full to confrontation  II: pupils Equal, round, reactive to light  III,VII: ptosis None  III,IV,VI: extraocular muscles  Full ROM  V: mastication Normal  V: facial light touch sensation  Normal  V,VII: corneal reflex  Present  VII: facial muscle function - upper  Normal  VII: facial muscle function - lower Normal  VIII: hearing Not tested  IX: soft palate elevation  Normal  IX,X: gag reflex Present  XI: trapezius strength  5/5  XI: sternocleidomastoid strength 5/5  XI: neck flexion strength  5/5  XII: tongue strength  Normal    Data Review Lab Results  Component Value Date   WBC 5.0 06/02/2015   HGB 12.2 06/02/2015  HCT 36.8 06/02/2015   MCV 87.6 06/02/2015   PLT 131* 06/02/2015   Lab Results  Component Value Date   NA 139 06/02/2015   K 3.4* 06/02/2015   CL 105 06/02/2015   CO2 26 06/02/2015   BUN 14 06/02/2015   CREATININE 0.76 06/02/2015   GLUCOSE 171* 06/02/2015   Lab Results  Component Value Date   INR 1.12 06/02/2015    Assessment/Plan: Patient admitted for PLIF L3-4, extraforaminal diskectomy L5-S1. Patient has failed a reasonable attempt at conservative therapy.  I explained the condition and procedure to the patient and answered any questions.  Patient wishes to proceed with procedure as planned. Understands risks/ benefits and typical outcomes of procedure.   Khameron Gruenwald S 06/10/2015 6:58 AM

## 2015-06-10 NOTE — Progress Notes (Signed)
Utilization review completed.  

## 2015-06-10 NOTE — Anesthesia Procedure Notes (Signed)
Procedure Name: Intubation Date/Time: 06/10/2015 8:45 AM Performed by: Lavell Luster Pre-anesthesia Checklist: Patient identified, Emergency Drugs available, Suction available, Patient being monitored and Timeout performed Patient Re-evaluated:Patient Re-evaluated prior to inductionOxygen Delivery Method: Circle system utilized Preoxygenation: Pre-oxygenation with 100% oxygen Intubation Type: IV induction Ventilation: Mask ventilation without difficulty Laryngoscope Size: Mac and 3 Grade View: Grade I Tube type: Oral Tube size: 7.0 mm Number of attempts: 1 Airway Equipment and Method: Stylet Placement Confirmation: ETT inserted through vocal cords under direct vision,  breath sounds checked- equal and bilateral and positive ETCO2 Secured at: 21 cm Tube secured with: Tape Dental Injury: Teeth and Oropharynx as per pre-operative assessment

## 2015-06-10 NOTE — Progress Notes (Signed)
Foley d/c by ronda hunt rn

## 2015-06-10 NOTE — Evaluation (Signed)
Physical Therapy Evaluation Patient Details Name: [name] MRN: [id number] DOB: [birth year] Today's Date: 06/10/2015   History of Present Illness  Patient is a 70 y/o female s/p L3-4 PLIF. PMH includes DM and CKD.  Clinical Impression  Patient presents with pain and post surgical deficits s/p above surgery impacting mobility. Tolerated ambulation with Min guard assist for safety. Education re: back precautions, log roll technique. Encouraged ambulation 2 more times tonight to improve strength/mobility. Will plan for stair training tomorrow as tolerated. Pt will have support from spouse at home. Will follow acutely to maximize independence and mobility prior to return home.    Follow Up Recommendations Home health PT;Supervision - Intermittent    Equipment Recommendations  Other (comment) (TBA tomorrow)    Recommendations for Other Services OT consult     Precautions / Restrictions Precautions Precautions: Back Precaution Booklet Issued: No Precaution Comments: Reviewed back precautions. Required Braces or Orthoses: Spinal Brace Spinal Brace: Lumbar corset Restrictions Weight Bearing Restrictions: No      Mobility  Bed Mobility Overal bed mobility: Needs Assistance Bed Mobility: Rolling;Sit to Sidelying Rolling: Supervision       Sit to sidelying: Supervision General bed mobility comments: Cues for log roll technique.   Transfers Overall transfer level: Needs assistance Equipment used: None Transfers: Sit to/from Stand Sit to Stand: Min guard         General transfer comment: Cues for hand placement/technique. Stood from [employer], from toilet x1.   Ambulation/Gait Ambulation/Gait assistance: Min guard Ambulation Distance (Feet): 125 Feet Assistive device:  (pushing IV pole) Gait Pattern/deviations: Step-through pattern;Decreased stride length Gait velocity: decreased Gait velocity interpretation: Below normal speed for age/gender General Gait Details:  Slow, guarded gait holding onto IV pole. Cues to adhere to precautions.   Stairs            Wheelchair Mobility    Modified Rankin (Stroke Patients Only)       Balance Overall balance assessment: Needs assistance Sitting-balance support: Feet supported;No upper extremity supported Sitting balance-Leahy Scale: Good     Standing balance support: During functional activity Standing balance-Leahy Scale: Fair Standing balance comment: Able to stand unsupported for short periods and wash hands without assist.                              Pertinent Vitals/Pain Pain Assessment: 0-10 Pain Score: 4  Pain Location: back Pain Descriptors / Indicators: Sore Pain Intervention(s): Monitored during session;RN gave pain meds during session;Repositioned    Home Living Family/patient expects to be discharged to:: Private residence Living Arrangements: Spouse/significant other Available Help at Discharge: Family;Available 24 hours/day Type of Home: House Home Access: Stairs to enter Entrance Stairs-Rails: Right Entrance Stairs-Number of Steps: 4 Home Layout: One level Home Equipment: None      Prior Function Level of Independence: Independent         Comments: Cleans houses for work.     Hand Dominance   Dominant Hand: Right    Extremity/Trunk Assessment   Upper Extremity Assessment: Defer to OT evaluation           Lower Extremity Assessment: Overall WFL for tasks assessed         Communication   Communication: No difficulties  Cognition Arousal/Alertness: Awake/alert Behavior During Therapy: WFL for tasks assessed/performed Overall Cognitive Status: Within Functional Limits for tasks assessed  General Comments General comments (skin integrity, edema, etc.): Spouse and son present.    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 PT Goal Formulation: With patient Time For Goal Achievement: 06/24/15 Potential to Achieve Goals: Good    Frequency Min 5X/week   Barriers to discharge        Co-evaluation               End of Session Equipment Utilized During Treatment: Gait belt;Back brace Activity Tolerance: Patient tolerated treatment well Patient left: in bed;with call bell/phone within reach;with family/visitor present Nurse Communication: Mobility status         Time: 1641-1700 PT Time Calculation (min) (ACUTE ONLY): 19 min   Charges:   PT Evaluation $PT Eval Moderate Complexity: 1 Procedure     PT G Codes:        Sandara Tyree A Ilaria Much 06/10/2015, 5:13 PM  Wray Kearns, [place], DPT [phone]

## 2015-06-10 NOTE — OR Nursing (Signed)
Hearing aids were returned to the patient and she placed them into her ears.

## 2015-06-10 NOTE — Op Note (Signed)
06/10/2015  12:28 PM  PATIENT:  [name]  70 y.o. female  PRE-OPERATIVE DIAGNOSIS:  Degenerative spondylolisthesis L3-4 with spinal stenosis, left L5-S1 extraforaminal disc protrusion, back pain and leg pain  POST-OPERATIVE DIAGNOSIS:  Same  PROCEDURE:   1. Decompressive lumbar laminectomy L4-5 left requiring more work than would be required for a simple exposure of the disk for TLIF in order to adequately decompress the neural elements and address the spinal stenosis 2. Transforaminal lumbar interbody fusion L4-5 left using PEEK interbody cage packed with morcellized allograft and autograft 3. Posterior fixation L4-5 using nuvasive cortical pedicle screws.  4. Intertransverse arthrodesis L4-5 using morcellized autograft and allograft. 5. There is separate fascial incision, left L5-S1 extraforaminal decompression and microdiscectomy utilizing microscopic dissection  SURGEON:  Sherley Bounds, MD  ASSISTANTS: Dr. Saintclair Halsted  ANESTHESIA:  General  EBL: 300 ml  Total I/O In: 1100 [I.V.:1100] Out: 600 [Urine:300; Blood:300]  BLOOD ADMINISTERED:none  DRAINS: Hemovac   INDICATION FOR PROCEDURE: This patient presented with a long history of back pain and a more recent history of left L5 radicular pain. MRI showed degenerative disc disease with a grade 1 spondylolisthesis at L3-4 as well as a left L5-S1 extra foraminal disc protrusion. She tried medical management including steroid injections without relief. I recommended decompression and instrument fusion at L3-4 to address her segmental instability and a left L5-S1 extra foraminal microdiscectomy to address her left L5 radiculopathy. Patient understood the risks, benefits, and alternatives and potential outcomes and wished to proceed.  PROCEDURE DETAILS:  The patient was brought to the operating room. After induction of generalized endotracheal anesthesia the patient was rolled into the prone position on chest rolls and all pressure  points were padded. The patient's lumbar region was cleaned and then prepped with DuraPrep and draped in the usual sterile fashion. Anesthesia was injected and then a dorsal midline incision was made and carried down to the lumbosacral fascia. The fascia was opened and the paraspinous musculature was taken down in a subperiosteal fashion to expose L3-4 bilateral as well as L5-S1 extra foraminal space on the left. A self-retaining retractor was placed. Intraoperative fluoroscopy confirmed my level, and I started with placement of the lateral L3 and right L4 cortical pedicle screws. The pedicle screw entry zones were identified utilizing surface landmarks and  AP and lateral fluoroscopy. I scored the cortex with the high-speed drill and then used the hand drill and EMG monitoring to drill an upward and outward direction into the pedicle. I then tapped line to line, and the tap was also monitored. I then placed a 5-0 x 35 mm cortical pedicle screw into the pedicles of L3 bilaterally, and a 5-0 x 35 mm cortical pedicle screw at L4 on the right. I then turned my attention to the decompression and lumbar laminectomy, hemi- facetectomy, and foraminotomy were performed at L4-5 left. The patient had significant spinal stenosis and this required more work than would be required for a simple exposure of the disc for  lumbar interbody fusion. Much more generous decompression was undertaken in order to adequately decompress the neural elements and address the patient's leg pain. The yellow ligament was removed to expose the underlying dura and nerve roots, and generous foraminotomy were performed to adequately decompress the neural elements. Both the exiting and traversing nerve roots were decompressed  until a coronary dilator passed easily along the nerve roots. Once the decompression was complete, I turned my attention to the transforaminal lower lumbar interbody fusion. The epidural  venous vasculature was coagulated and cut  sharply. Disc space was incised and the initial discectomy was performed with pituitary rongeurs. The disc space was distracted with sequential distractors to a height of 8 mm. We then used a series of scrapers and shavers to prepare the endplates for fusion. The midline was prepared with Epstein curettes. Once the complete discectomy was finished, we packed an appropriate sized peek interbody cage with local autograft and morcellized allograft, gently retracted the nerve root, and tapped the cage into position at L4-5 left.  The disc space was packed with morselized autograft and allograft around the cage. We then turned our attention to the placement of the lower pedicle screw at L4 on the left. The pedicle screw entry zones were identified utilizing surface landmarks and fluoroscopy. I drilled into each pedicle utilizing the hand drill and EMG monitoring, and tapped each pedicle with the appropriate tap. We palpated with a ball probe to assure no break in the cortex. We then placed 5-0 x 35 mm screw into the pedicles bilaterally at L4 left. We then decorticated the transverse processes and laid a mixture of morcellized autograft and allograft out over these to perform intertransverse arthrodesis at L4-5 left. We then placed lordotic rods into the multiaxial screw heads of the pedicle screws and locked these in position with the locking caps and anti-torque device. We then checked our construct with AP and lateral fluoroscopy. I then drilled the lateral part of the pars of L5 and the superior part of the facet. I dissected down and opened the ligament overlying the L5 nerve root. The L5 nerve root was identified. I then used the bipolar to coagulate the epidural fat in the axilla of the nerve root. I found a subannular disc herniation incised this with a 15 blade scalpel and performed a thorough intradiscal discectomy. Several fragments were removed from the axilla the nerve root also. Then palpated with coronary  dilator. It passed easily along the nerve root. The nerve root appeared to be free.  Irrigated with copious amounts of bacitracin-containing saline solution. Placed a medium Hemovac drain through separate stab incision. Inspected the nerve roots once again to assure adequate decompression, lined to the dura with Gelfoam, and closed the muscle and the fascia with 0 Vicryl. Closed the subcutaneous tissues with 2-0 Vicryl and subcuticular tissues with 3-0 Vicryl. The skin was closed with benzoin and Steri-Strips. Dressing was then applied, the patient was awakened from general anesthesia and transported to the recovery room in stable condition. At the end of the procedure all sponge, needle and instrument counts were correct.   PLAN OF CARE: Admit to inpatient   PATIENT DISPOSITION:  PACU - hemodynamically stable.   Delay start of Pharmacological VTE agent (>24hrs) due to surgical blood loss or risk of bleeding:  yes

## 2015-06-10 NOTE — Anesthesia Postprocedure Evaluation (Signed)
Anesthesia Post Note  Patient: MORAYO LEVEN  Procedure(s) Performed: Procedure(s) (LRB): FOR MAXIMUM ACCESS (MAS) POSTERIOR LUMBAR INTERBODY FUSION (PLIF) LUMBAR THREE-FOUR EXTRAFORAMINAL MICRODISCECTOMY LUMBAR FIVE-SACRAL ONE LEFT (Left)  Patient location during evaluation: PACU Anesthesia Type: General Level of consciousness: awake and alert Pain management: pain level controlled Vital Signs Assessment: post-procedure vital signs reviewed and stable Respiratory status: spontaneous breathing, nonlabored ventilation, respiratory function stable and patient connected to nasal cannula oxygen Cardiovascular status: blood pressure returned to baseline and stable Postop Assessment: no signs of nausea or vomiting Anesthetic complications: no    Last Vitals:  Filed Vitals:   06/10/15 1400 06/10/15 1415  BP: 124/60   Pulse: 79 81  Temp:    Resp: 11 12    Last Pain:  Filed Vitals:   06/10/15 1416  PainSc: 4                  Talana Slatten,JAMES TERRILL

## 2015-06-11 ENCOUNTER — Encounter (HOSPITAL_COMMUNITY): Payer: Self-pay | Admitting: Neurological Surgery

## 2015-06-11 LAB — GLUCOSE, CAPILLARY
GLUCOSE-CAPILLARY: 119 mg/dL — AB (ref 65–99)
GLUCOSE-CAPILLARY: 110 mg/dL — AB (ref 65–99)

## 2015-06-11 MED ORDER — OXYCODONE-ACETAMINOPHEN 5-325 MG PO TABS: 1.0000 | ORAL_TABLET | ORAL | Status: DC | PRN

## 2015-06-11 MED ORDER — METHOCARBAMOL 500 MG PO TABS: 500.0000 mg | ORAL_TABLET | Freq: Four times a day (QID) | ORAL | Status: DC | PRN

## 2015-06-11 NOTE — Progress Notes (Signed)
Physical Therapy Treatment Patient Details Name: [name] MRN: [id number] DOB: 1945/08/02 Today's Date: 06/11/2015    History of Present Illness Patient is a 70 y/o female s/p L3-4 PLIF. PMH includes DM and CKD.    PT Comments    Pt progressing towards physical therapy goals. Was able to perform transfers and ambulation with supervision for safety. Continues to appear guarded at times, and will keep on acute caseload for 1 more session IF pt does not discharge this afternoon. Pt not waiting for PT to d/c.   Follow Up Recommendations  Home health PT;Supervision - Intermittent     Equipment Recommendations  Other (comment)    Recommendations for Other Services       Precautions / Restrictions Precautions Precautions: Back Precaution Booklet Issued: No Precaution Comments: Pt able to recall 2/3 back precautions. Reviewed handout Required Braces or Orthoses: Spinal Brace Spinal Brace: Lumbar corset;Applied in sitting position Restrictions Weight Bearing Restrictions: No    Mobility  Bed Mobility               General bed mobility comments: Pt sitting up on EOB with husband present when PT arrived.   Transfers Overall transfer level: Needs assistance Equipment used: None Transfers: Sit to/from Stand Sit to Stand: Supervision         General transfer comment: Pt demonstrated proper hand placement and safety awareness throughout transfer to/from sitting.   Ambulation/Gait Ambulation/Gait assistance: Supervision Ambulation Distance (Feet): 400 Feet Assistive device: None Gait Pattern/deviations: Step-through pattern;Decreased stride length;Trunk flexed Gait velocity: decreased Gait velocity interpretation: Below normal speed for age/gender General Gait Details: VC's for imrpoved posture. Somewhat guarded gait but improved from initial eval.    Stairs            Wheelchair Mobility    Modified Rankin (Stroke Patients Only)       Balance  Overall balance assessment: Needs assistance Sitting-balance support: No upper extremity supported;Feet supported Sitting balance-Leahy Scale: Good     Standing balance support: No upper extremity supported;During functional activity Standing balance-Leahy Scale: Fair Standing balance comment: Required 1 UE support for dynamic balance tasks - advised to use rollator for mobility                    Cognition Arousal/Alertness: Awake/alert Behavior During Therapy: WFL for tasks assessed/performed Overall Cognitive Status: Within Functional Limits for tasks assessed                      Exercises      General Comments        Pertinent Vitals/Pain Pain Assessment: Faces Pain Score: 3  Faces Pain Scale: Hurts little more Pain Location: incision Pain Descriptors / Indicators: Operative site guarding;Discomfort Pain Intervention(s): Limited activity within patient's tolerance;Monitored during session;Repositioned    Home Living Family/patient expects to be discharged to:: Private residence Living Arrangements: Spouse/significant other Available Help at Discharge: Family;Available 24 hours/day Type of Home: House Home Access: Stairs to enter Entrance Stairs-Rails: Right Home Layout: One level Home Equipment: Bedside commode;Hand held shower head;Walker - 4 wheels;Grab bars - tub/shower;Shower seat - built in;Other (comment);Adaptive equipment      Prior Function Level of Independence: Independent      Comments: Cleans houses for work, enjoys reading Bible, very active in church   PT Goals (current goals can now be found in the care plan section) Acute Rehab PT Goals Patient Stated Goal: to go home PT Goal Formulation: With patient Time For  Goal Achievement: 06/24/15 Potential to Achieve Goals: Good Progress towards PT goals: Progressing toward goals    Frequency  Min 5X/week    PT Plan Current plan remains appropriate    Co-evaluation              End of Session Equipment Utilized During Treatment: Gait belt;Back brace Activity Tolerance: Patient tolerated treatment well Patient left: in bed;with call bell/phone within reach;with family/visitor present     Time: 0800-0823 PT Time Calculation (min) (ACUTE ONLY): 23 min  Charges:  $Gait Training: 23-37 mins                    G Codes:      Rolinda Roan 2015-06-15, 10:33 AM   Rolinda Roan, PT, DPT Acute Rehabilitation Services Pager: [phone]

## 2015-06-11 NOTE — Progress Notes (Signed)
Patient alert and oriented, mae's well, voiding adequate amount of urine, swallowing without difficulty, c/o mild pain. Patient discharged home with family. Script and discharged instructions given to patient. Patient and family stated understanding of d/c instructions given and has an appointment with MD.

## 2015-06-11 NOTE — Discharge Summary (Signed)
Physician Discharge Summary  Patient ID: [name] MRN: [id number] DOB/AGE: 01-09-1946 70 y.o.  Admit date: 06/10/2015 Discharge date: 06/11/2015  Admission Diagnoses: spondylolisthesis stenosis L3-4    Discharge Diagnoses: same   Discharged Condition: good  Hospital Course: The patient was admitted on 06/10/2015 and taken to the operating room where the patient underwent PLIF L3-4, extraforaminal diskectomy L5-s1. The patient tolerated the procedure well and was taken to the recovery room and then to the floor in stable condition. The hospital course was routine. There were no complications. The wound remained clean dry and intact. Pt had appropriate back soreness. No complaints of leg pain or new N/T/W. The patient remained afebrile with stable vital signs, and tolerated a regular diet. The patient continued to increase activities, and pain was well controlled with oral pain medications.   Consults: None  Significant Diagnostic Studies:  Results for orders placed or performed during the hospital encounter of 06/10/15  Glucose, capillary  Result Value Ref Range   Glucose-Capillary 131 (H) 65 - 99 mg/dL  Glucose, capillary  Result Value Ref Range   Glucose-Capillary 105 (H) 65 - 99 mg/dL  Glucose, capillary  Result Value Ref Range   Glucose-Capillary 262 (H) 65 - 99 mg/dL  Glucose, capillary  Result Value Ref Range   Glucose-Capillary 233 (H) 65 - 99 mg/dL   Comment 1 Notify RN    Comment 2 Document in Chart   Glucose, capillary  Result Value Ref Range   Glucose-Capillary 119 (H) 65 - 99 mg/dL    Chest 2 View  06/02/2015  CLINICAL DATA:  Preoperative examination prior to lumbar fusion EXAM: CHEST  2 VIEW COMPARISON:  PA and lateral chest x-ray dated November 06, 2005 FINDINGS: The lungs are well-expanded. The interstitial markings are coarse bilaterally and are more conspicuous than in the past. There is no alveolar infiltrate. There is no pleural effusion. The heart and  pulmonary vascularity are normal. The trachea is midline. The bony thorax exhibits gentle dextro curvature centered at the thoracolumbar junction. There is mild multilevel degenerative disc space narrowing of the thoracic spine. IMPRESSION: Mildly increased pulmonary interstitial markings more conspicuous than on the study of approximately 9-1/2 years ago. This may reflect interval development of chronic bronchitis. There is no alveolar pneumonia nor CHF. Electronically Signed   By: Joahan Swatzell  [place] M.D.   On: 06/02/2015 15:33   Dg Lumbar Spine 2-3 Views  06/10/2015  CLINICAL DATA:  L3-4 PLIF with L5-S1 micro diskectomy EXAM: DG C-ARM 61-120 MIN; LUMBAR SPINE - 2-3 VIEW 19 seconds of fluoroscopic time COMPARISON:  None. FINDINGS: Fluoroscopic images demonstrate L3-4 PLIF hardware without malalignment. IMPRESSION: L3-4 PLIF hardware without malalignment. Electronically Signed   By: Abelardo Diesel M.D.   On: 06/10/2015 12:16   Dg C-arm 61-120 Min  06/10/2015  CLINICAL DATA:  L3-4 PLIF with L5-S1 micro diskectomy EXAM: DG C-ARM 61-120 MIN; LUMBAR SPINE - 2-3 VIEW 19 seconds of fluoroscopic time COMPARISON:  None. FINDINGS: Fluoroscopic images demonstrate L3-4 PLIF hardware without malalignment. IMPRESSION: L3-4 PLIF hardware without malalignment. Electronically Signed   By: Abelardo Diesel M.D.   On: 06/10/2015 12:16    Antibiotics:  Anti-infectives    Start     Dose/Rate Route Frequency Ordered Stop   06/10/15 1530  ceFAZolin (ANCEF) IVPB 1 g/50 mL premix     1 g 100 mL/hr over 30 Minutes Intravenous Every 8 hours 06/10/15 1528 06/10/15 2253   06/10/15 1208  vancomycin (VANCOCIN) powder  Status:  Discontinued  As needed 06/10/15 1209 06/10/15 1231   06/10/15 1151  vancomycin (VANCOCIN) 1000 MG powder    Comments:  Day, Dory   : cabinet override      06/10/15 1151 06/10/15 2359   06/10/15 0954  bacitracin 50,000 Units in sodium chloride irrigation 0.9 % 500 mL irrigation  Status:  Discontinued        As needed 06/10/15 0954 06/10/15 1231   06/10/15 0600  ceFAZolin (ANCEF) IVPB 2 g/50 mL premix     2 g 100 mL/hr over 30 Minutes Intravenous On call to O.R. 06/09/15 1308 06/10/15 0848      Discharge Exam: Blood pressure 106/35, pulse 75, temperature 99.1 F (37.3 C), temperature source Oral, resp. rate 18, SpO2 95 %. Neurologic: Grossly normal Dressing dry  Discharge Medications:     Medication List    TAKE these medications        AMBULATORY NON FORMULARY MEDICATION  One Touch Ultra Blue Test Strips Sig: Use to test blood sugar three times daily to control blood sugar Dx: E11.9     calcium carbonate 600 MG Tabs tablet  Commonly known as:  OS-CAL  Take 600 mg by mouth 2 (two) times daily with a meal.     cholecalciferol 1000 units tablet  Commonly known as:  VITAMIN D  Take 1,000 Units by mouth daily.     Cinnamon 500 MG Tabs  Take 1,000 mg by mouth daily.     ciprofloxacin 250 MG tablet  Commonly known as:  CIPRO  Take 250 mg by mouth 2 (two) times daily as needed (to prevent UTIs). Take one tablet after intercourse to prevent urinary tract infection     CLARITIN 10 MG tablet  Generic drug:  loratadine  Take 10 mg by mouth daily.     fish oil-omega-3 fatty acids 1000 MG capsule  Take 1 g by mouth daily.     fluticasone 50 MCG/ACT nasal spray  Commonly known as:  FLONASE  Place 2 sprays into both nostrils 2 (two) times daily. Decrease to 2 sprays/nostril daily after 5 days     glucose blood test strip  Use as instructed to test blood sugar three times daily  Dx  250.00     insulin detemir 100 UNIT/ML injection  Commonly known as:  LEVEMIR  Inject 30-35 units into skin at bedtime to control blood sugar     Insulin Pen Needle 31G X 5 MM Misc  Commonly known as:  B-D UF III MINI PEN NEEDLES  Use daily with the administration of insulin E11.9     INSULIN SYRINGE .5CC/31GX5/16" 31G X 5/16" 0.5 ML Misc  Use daily with the administration of insulin E11.9      meloxicam 7.5 MG tablet  Commonly known as:  MOBIC  TAKE 1 TABLET BY MOUTH EVERY DAY FOR ARTHRITIS OR PAIN     metFORMIN 1000 MG tablet  Commonly known as:  GLUCOPHAGE  Take one tablet by mouth twice daily to treat diabetes     methocarbamol 500 MG tablet  Commonly known as:  ROBAXIN  Take 1 tablet (500 mg total) by mouth every 6 (six) hours as needed for muscle spasms.     multivitamin with minerals tablet  Take 1 tablet by mouth daily.     omeprazole 20 MG capsule  Commonly known as:  PRILOSEC  TAKE ONE CAPSULE BY MOUTH DAILY     oxyCODONE-acetaminophen 5-325 MG tablet  Commonly known as:  PERCOCET/ROXICET  Take 1-2  tablets by mouth every 4 (four) hours as needed for moderate pain.     traMADol 50 MG tablet  Commonly known as:  ULTRAM  Take 1 tablet (50 mg total) by mouth every 6 (six) hours as needed.     VITAMIN B 12 PO  Take 1,000 mcg by mouth daily.        Disposition: home   Final Dx: PLIF L3-4, extraforaminal diskectomy L5-S1      Discharge Instructions     Remove dressing in 72 hours    Complete by:  As directed      Call MD for:  difficulty breathing, headache or visual disturbances    Complete by:  As directed      Call MD for:  persistant nausea and vomiting    Complete by:  As directed      Call MD for:  redness, tenderness, or signs of infection (pain, swelling, redness, odor or green/yellow discharge around incision site)    Complete by:  As directed      Call MD for:  severe uncontrolled pain    Complete by:  As directed      Call MD for:  temperature >100.4    Complete by:  As directed      Diet - low sodium heart healthy    Complete by:  As directed      Discharge instructions    Complete by:  As directed   No strenuous activity, no bending or twisting, no heavy lifting, no driving     Increase activity slowly    Complete by:  As directed               Signed: Hanne Kegg S 06/11/2015, 7:31 AM

## 2015-06-11 NOTE — Progress Notes (Signed)
Occupational Therapy Evaluation/Discharge Patient Details Name: [name] MRN: [id number] DOB: 1945/06/29 Today's Date: 06/11/2015    History of Present Illness Patient is a 70 y/o female s/p L3-4 PLIF. PMH includes DM and CKD.   Clinical Impression   PTA, pt was independent with ADLs and mobility. Pt currently requires supervision for mobility and set up assist for ADLs. Reviewed back precautions, brace wear protocol, positioning for sleep, compensatory strategies for ADLs, and use of reacher for ADLs. Educated pt/husband on energy conservation, fall prevention, and pain/edema management. Pt plans to d/c home with 24/7 assistance from her husband. All education has been completed and pt/husband has no further questions. Pt adequate for discharge from occupational therapy standpoint. Pt with no further acute OT needs. OT signing off. Thank you for this referral.    Follow Up Recommendations  No OT follow up;Supervision/Assistance - 24 hour    Equipment Recommendations  None recommended by OT    Recommendations for Other Services       Precautions / Restrictions Precautions Precautions: Back Precaution Booklet Issued: No Precaution Comments: Pt able to recall 2/3 back precautions. Reviewed handout Restrictions Weight Bearing Restrictions: No      Mobility Bed Mobility               General bed mobility comments: Pt up in chair on OT arrival  Transfers Overall transfer level: Needs assistance Equipment used: None Transfers: Sit to/from Stand Sit to Stand: Supervision         General transfer comment: x3 supervision for safety. Good demonstration of safe hand placement.    Balance Overall balance assessment: Needs assistance Sitting-balance support: No upper extremity supported;Feet supported Sitting balance-Leahy Scale: Good     Standing balance support: No upper extremity supported;During functional activity Standing balance-Leahy Scale:  Fair Standing balance comment: Required 1 UE support for dynamic balance tasks - advised to use rollator for mobility                            ADL Overall ADL's : Needs assistance/impaired     Grooming: Oral care;Cueing for compensatory techniques;Standing Grooming Details (indicate cue type and reason): Educated on 2 cup method for oral care Upper Body Bathing: Set up;Sitting   Lower Body Bathing: Set up;Adhering to back precautions;Cueing for compensatory techniques;Sit to/from stand   Upper Body Dressing : Set up;Sitting   Lower Body Dressing: Set up;Cueing for compensatory techniques;Adhering to back precautions;Sit to/from stand Lower Body Dressing Details (indicate cue type and reason): Able to cross ankle-over-knee Toilet Transfer: Supervision/safety;Cueing for safety;Ambulation;BSC Toilet Transfer Details (indicate cue type and reason): BSC over toilet, cues to feel BSC on back of legs before sitting Toileting- Clothing Manipulation and Hygiene: Supervision/safety;Cueing for compensatory techniques;Adhering to back precautions;Sit to/from stand Toileting - Clothing Manipulation Details (indicate cue type and reason): Cues for technique for pericare Tub/ Shower Transfer: Walk-in shower;Supervision/safety;Cueing for sequencing;Ambulation Tub/Shower Transfer Details (indicate cue type and reason): Cues for proper step sequence Functional mobility during ADLs: Supervision/safety General ADL Comments: Reviewed back precautions, brace wear protocol, positioning for sleeping, compensatory strategies for ADLs, energy conservation, fall prevention, and pain/edema management strategies. Practiced all transfers and ADLs with supervision level assist and cues for compensatory strategies. Educated pt/husband to gradually increase activity level and pt "furniture walking" in room so advised pt to use rollator as needed. Pt's husband present for OT eval.     Vision Vision  Assessment?: No apparent visual deficits  Perception     Praxis      Pertinent Vitals/Pain Pain Assessment: 0-10 Pain Score: 3  Pain Location: back  Pain Descriptors / Indicators: Sore Pain Intervention(s): Limited activity within patient's tolerance;Monitored during session;Repositioned     Hand Dominance Right   Extremity/Trunk Assessment Upper Extremity Assessment Upper Extremity Assessment: Overall WFL for tasks assessed   Lower Extremity Assessment Lower Extremity Assessment: Overall WFL for tasks assessed   Cervical / Trunk Assessment Cervical / Trunk Assessment: Normal   Communication Communication Communication: No difficulties   Cognition Arousal/Alertness: Awake/alert Behavior During Therapy: WFL for tasks assessed/performed Overall Cognitive Status: Within Functional Limits for tasks assessed                     General Comments       Exercises       Shoulder Instructions      Home Living Family/patient expects to be discharged to:: Private residence Living Arrangements: Spouse/significant other Available Help at Discharge: Family;Available 24 hours/day Type of Home: House Home Access: Stairs to enter [employer] of Steps: 4 Entrance Stairs-Rails: Right Home Layout: One level     Bathroom Shower/Tub: Walk-in shower;Door   [employer] Toilet: Handicapped height Bathroom Accessibility: Yes How Accessible: Accessible via walker Home Equipment: Bedside commode;Hand held shower head;Walker - 4 wheels;Grab bars - tub/shower;Shower seat - built in;Other (comment);Adaptive equipment Adaptive Equipment: Reacher        Prior Functioning/Environment Level of Independence: Independent        Comments: Cleans houses for work, enjoys reading Bible, very active in church    OT Diagnosis: Acute pain   OT Problem List: Decreased strength;Decreased range of motion;Decreased activity tolerance;Impaired balance (sitting and/or  standing);Decreased coordination;Decreased safety awareness;Decreased knowledge of precautions;Decreased knowledge of use of DME or AE;Pain   OT Treatment/Interventions:      OT Goals(Current goals can be found in the care plan section) Acute Rehab OT Goals Patient Stated Goal: to go home OT Goal Formulation: With patient Time For Goal Achievement: 06/25/15 Potential to Achieve Goals: Good  OT Frequency:     Barriers to D/C:            Co-evaluation              End of Session Equipment Utilized During Treatment: Gait belt;Back brace Nurse Communication: Mobility status;Precautions  Activity Tolerance: Patient tolerated treatment well Patient left: in chair;with call bell/phone within reach;with family/visitor present   Time: 0903-0929 OT Time Calculation (min): 26 min Charges:  OT General Charges $OT Visit: 1 Procedure OT Evaluation $OT Eval Moderate Complexity: 1 Procedure OT Treatments $Self Care/Home Management : 8-22 mins G-Codes:    Redmond Baseman, OTR/L Pager: [phone] 06/11/2015, 10:20 AM

## 2015-06-12 ENCOUNTER — Encounter (HOSPITAL_COMMUNITY): Payer: Self-pay | Admitting: Neurological Surgery

## 2015-06-20 ENCOUNTER — Other Ambulatory Visit: Payer: Self-pay | Admitting: Internal Medicine

## 2015-07-20 DIAGNOSIS — M4316 Spondylolisthesis, lumbar region: Secondary | ICD-10-CM | POA: Diagnosis not present

## 2015-07-22 ENCOUNTER — Other Ambulatory Visit: Payer: Self-pay

## 2015-07-22 DIAGNOSIS — Z1231 Encounter for screening mammogram for malignant neoplasm of breast: Secondary | ICD-10-CM

## 2015-07-24 ENCOUNTER — Other Ambulatory Visit: Payer: Medicare Other

## 2015-07-24 DIAGNOSIS — E785 Hyperlipidemia, unspecified: Secondary | ICD-10-CM | POA: Diagnosis not present

## 2015-07-24 DIAGNOSIS — Z794 Long term (current) use of insulin: Secondary | ICD-10-CM | POA: Diagnosis not present

## 2015-07-24 DIAGNOSIS — I10 Essential (primary) hypertension: Secondary | ICD-10-CM

## 2015-07-24 DIAGNOSIS — E1165 Type 2 diabetes mellitus with hyperglycemia: Secondary | ICD-10-CM

## 2015-07-25 LAB — COMPREHENSIVE METABOLIC PANEL
ALK PHOS: 83 IU/L (ref 39–117)
ALT: 42 IU/L — AB (ref 0–32)
AST: 41 IU/L — ABNORMAL HIGH (ref 0–40)
Albumin/Globulin Ratio: 1.1 — ABNORMAL LOW (ref 1.2–2.2)
Albumin: 3.7 g/dL (ref 3.6–4.8)
BILIRUBIN TOTAL: 0.5 mg/dL (ref 0.0–1.2)
BUN/Creatinine Ratio: 17 (ref 11–26)
BUN: 11 mg/dL (ref 8–27)
CHLORIDE: 99 mmol/L (ref 96–106)
CO2: 26 mmol/L (ref 18–29)
Calcium: 9.3 mg/dL (ref 8.7–10.3)
Creatinine, Ser: 0.64 mg/dL (ref 0.57–1.00)
GFR calc Af Amer: 106 mL/min/{1.73_m2} (ref >59)
GFR calc non Af Amer: 92 mL/min/{1.73_m2} (ref >59)
GLUCOSE: 161 mg/dL — AB (ref 65–99)
Globulin, Total: 3.5 g/dL (ref 1.5–4.5)
Potassium: 4 mmol/L (ref 3.5–5.2)
Sodium: 140 mmol/L (ref 134–144)
Total Protein: 7.2 g/dL (ref 6.0–8.5)

## 2015-07-25 LAB — LIPID PANEL
Chol/HDL Ratio: 3 ratio units (ref 0.0–4.4)
Cholesterol, Total: 194 mg/dL (ref 100–199)
HDL: 65 mg/dL (ref >39)
LDL Calculated: 112 mg/dL — ABNORMAL HIGH (ref 0–99)
TRIGLYCERIDES: 87 mg/dL (ref 0–149)
VLDL Cholesterol Cal: 17 mg/dL (ref 5–40)

## 2015-07-25 LAB — HEMOGLOBIN A1C
ESTIMATED AVERAGE GLUCOSE: 220 mg/dL
Hgb A1c MFr Bld: 9.3 % — ABNORMAL HIGH (ref 4.8–5.6)

## 2015-07-25 LAB — MICROALBUMIN, URINE: MICROALBUM., U, RANDOM: 11.1 ug/mL

## 2015-07-28 ENCOUNTER — Ambulatory Visit (INDEPENDENT_AMBULATORY_CARE_PROVIDER_SITE_OTHER): Payer: Medicare Other | Admitting: Internal Medicine

## 2015-07-28 ENCOUNTER — Encounter: Payer: Self-pay | Admitting: Internal Medicine

## 2015-07-28 VITALS — BP 124/72 | HR 77 | Temp 98.8°F | Resp 17 | Ht 63.0 in | Wt 153.6 lb

## 2015-07-28 DIAGNOSIS — I10 Essential (primary) hypertension: Secondary | ICD-10-CM

## 2015-07-28 DIAGNOSIS — E1165 Type 2 diabetes mellitus with hyperglycemia: Secondary | ICD-10-CM

## 2015-07-28 DIAGNOSIS — E785 Hyperlipidemia, unspecified: Secondary | ICD-10-CM | POA: Diagnosis not present

## 2015-07-28 DIAGNOSIS — Z794 Long term (current) use of insulin: Secondary | ICD-10-CM

## 2015-07-28 DIAGNOSIS — Z981 Arthrodesis status: Secondary | ICD-10-CM

## 2015-07-28 NOTE — Progress Notes (Signed)
Patient ID: [name], female   DOB: 1945/08/05, 70 y.o.   MRN: [id number]   Location:  [facility]  Provider: Jeanmarie Hubert, M.D.  Code Status: full Goals of Care:  Advanced Directives 07/28/2015  Does patient have an advance directive? No  Would patient like information on creating an advanced directive? No - patient declined information     Chief Complaint  Patient presents with  . Medical Management of Chronic Issues    4 month follow up (labs printed)  . OTHER    Husband, Macala Baldonado in room.   . OTHER    Refill on Flonase    HPI: Patient is a 70 y.o. female seen today for medical management of chronic diseases.    Essential hypertension - Controlled  Controlled type 2 diabetes mellitus with hyperglycemia, with long-term current use of insulin (HCC) - control is not as good as in the past. Hemoglobin A1c has risen. Patient received steroids by the time she was having trouble with her low back. She also had surgery on her low back and last quarter. She believes that food that was brought to her house while she was ill was not good for her diabetes. Lots of casseroles and sweets.  Hyperlipemia - reasonably well controlled  S/P lumbar spinal fusion - patient's postoperative course is satisfactory. She is moving further and with more energy. She can be out walking. She still has some limitations in daily activities. She was told she could either mop, sweep, or vacuum, but do not double up on any of these activities. She denies any numbness or discomfort in her legs.     Past Medical History  Diagnosis Date  . Allergy   . Arthritis   . GERD (gastroesophageal reflux disease)   . Diabetes mellitus   . Epistaxis   . Cramp of limb   . Neoplasm of uncertain behavior of skin   . Dysuria   . Nonspecific elevation of levels of transaminase or lactic acid dehydrogenase (LDH)   . Dysphagia, unspecified(787.20)   . Unspecified essential hypertension   . Pain in joint,  shoulder region   . Lumbago   . Other and unspecified hyperlipidemia   . Osteoarthrosis, unspecified whether generalized or localized, unspecified site   . Postablative ovarian failure   . Type 2 diabetes mellitus without complication ([place])   . Paresthesias 04/01/2015  . Heart murmur     NO CARDIOLOGIST  DX FOR YEARS ASYMPTOMATIC  . Chronic kidney disease, stage I     DR Bryon Lions    Past Surgical History  Procedure Laterality Date  . Vaginal hysterectomy  1997    Dr Rande Lawman  . Colonoscopy    . Upper gastrointestinal endoscopy    . Tubal ligation  1982    Dr Connye Burkitt  . Maximum access (mas)posterior lumbar interbody fusion (plif) 1 level Left 06/10/2015    Procedure: FOR MAXIMUM ACCESS (MAS) POSTERIOR LUMBAR INTERBODY FUSION (PLIF) LUMBAR THREE-FOUR EXTRAFORAMINAL MICRODISCECTOMY LUMBAR FIVE-SACRAL ONE LEFT;  Surgeon: Eustace Moore, MD;  Location: [facility];  Service: Neurosurgery;  Laterality: Left;    Allergies  Allergen Reactions  . Kiwi Extract Anaphylaxis  . Tdap [Diphth-Acell Pertussis-Tetanus] Swelling    Swelling at injection site, gets very hot  . Tramadol Nausea And Vomiting  . Latex Itching, Dermatitis and Rash      Medication List       This list is accurate as of: 07/28/15  2:17 PM.  Always  use your most recent med list.               AMBULATORY NON FORMULARY MEDICATION  One Touch Ultra Blue Test Strips Sig: Use to test blood sugar three times daily to control blood sugar Dx: E11.9     calcium carbonate 600 MG Tabs tablet  Commonly known as:  OS-CAL  Take 600 mg by mouth 2 (two) times daily with a meal.     cholecalciferol 1000 units tablet  Commonly known as:  VITAMIN D  Take 1,000 Units by mouth daily.     Cinnamon 500 MG Tabs  Take 1,000 mg by mouth daily.     ciprofloxacin 250 MG tablet  Commonly known as:  CIPRO  Take 250 mg by mouth 2 (two) times daily as needed (to prevent UTIs). Take one tablet after intercourse to prevent urinary  tract infection     CLARITIN 10 MG tablet  Generic drug:  loratadine  Take 10 mg by mouth daily.     fish oil-omega-3 fatty acids 1000 MG capsule  Take 1 g by mouth daily.     fluticasone 50 MCG/ACT nasal spray  Commonly known as:  FLONASE  Place 2 sprays into both nostrils 2 (two) times daily. Decrease to 2 sprays/nostril daily after 5 days     glucose blood test strip  Use as instructed to test blood sugar three times daily  Dx  250.00     insulin detemir 100 UNIT/ML injection  Commonly known as:  LEVEMIR  Inject 30-35 units into skin at bedtime to control blood sugar     INSULIN SYRINGE .5CC/31GX5/16" 31G X 5/16" 0.5 ML Misc  Use daily with the administration of insulin E11.9     meloxicam 7.5 MG tablet  Commonly known as:  MOBIC  TAKE 1 TABLET BY MOUTH EVERY DAY FOR ARTHRITIS OR PAIN     metFORMIN 1000 MG tablet  Commonly known as:  GLUCOPHAGE  Take one tablet by mouth twice daily to treat diabetes     multivitamin with minerals tablet  Take 1 tablet by mouth daily.     omeprazole 20 MG capsule  Commonly known as:  PRILOSEC  TAKE ONE CAPSULE BY MOUTH DAILY     VITAMIN B 12 PO  Take 1,000 mcg by mouth daily.        Review of Systems:  Review of Systems  Constitutional: Negative.   HENT: Positive for hearing loss and postnasal drip.   Eyes: Negative.   Respiratory: Negative.   Cardiovascular: Positive for chest pain. Negative for palpitations and leg swelling.  Gastrointestinal:       Occasional heartburn  Endocrine:       Diabetic  Musculoskeletal: Positive for myalgias, back pain and arthralgias. Negative for joint swelling and gait problem.  Skin:       Complains of thinning hair  Neurological:       Distal paresthesias of tingling and burning in the feet and lower legs  Hematological: Negative.   Psychiatric/Behavioral: Positive for sleep disturbance (insomnia).    Health Maintenance  Topic Date Due  . Hepatitis C Screening  1946/04/13  .  ZOSTAVAX  12/06/2006  . TETANUS/TDAP  01/11/2009  . PNA vac Low Risk Adult (2 of 2 - PPSV23) 06/27/2012  . OPHTHALMOLOGY EXAM  07/29/2015  . INFLUENZA VACCINE  12/08/2015  . COLONOSCOPY  12/22/2015  . HEMOGLOBIN A1C  01/24/2016  . FOOT EXAM  03/30/2016  . MAMMOGRAM  07/20/2016  .  URINE MICROALBUMIN  07/23/2016  . DEXA SCAN  Completed    Physical Exam: Filed Vitals:   07/28/15 1344  BP: 124/72  Pulse: 77  Temp: 98.8 F (37.1 C)  TempSrc: Oral  Resp: 17  Height: 5' 3"  (1.6 m)  Weight: 153 lb 9.6 oz (69.673 kg)  SpO2: 94%   Body mass index is 27.22 kg/(m^2). Physical Exam  Constitutional: She is oriented to person, place, and time. She appears well-developed and well-nourished. No distress.  HENT:  Head: Normocephalic and atraumatic.  Nose: Nose normal.  Bilateral hearing aids  Eyes: Conjunctivae and EOM are normal. Pupils are equal, round, and reactive to light.  Neck: Neck supple. No JVD present. No tracheal deviation present. No thyromegaly present.  Cardiovascular: Normal rate, regular rhythm, normal heart sounds and intact distal pulses.  Exam reveals no gallop and no friction rub.   No murmur heard. Pulmonary/Chest: Effort normal and breath sounds normal. No respiratory distress. She has no wheezes. She has no rales.  Abdominal: Bowel sounds are normal. She exhibits no distension and no mass. There is no tenderness.  Musculoskeletal: Normal range of motion. She exhibits no edema or tenderness.  Patient has evidence of  rupture long head of the biceps proximally.in the right mid upper arm. It is not tender. Previous bruising is resolved. The muscle is balled up in the mid right upper arm. Tender to percussion in the lower back.  Lymphadenopathy:    She has no cervical adenopathy.  Neurological: She is alert and oriented to person, place, and time. She has normal reflexes. No cranial nerve deficit. Coordination normal.  Skin: No rash noted. No erythema. No pallor.  Hair  is quite thin over the scalp.  Psychiatric: She has a normal mood and affect. Her behavior is normal. Thought content normal.    Labs reviewed: Basic Metabolic Panel:  Recent Labs  05/09/15 2356 06/02/15 1458 07/24/15 0933  NA 140 139 140  K 3.8 3.4* 4.0  CL 105 105 99  CO2 25 26 26   GLUCOSE 325* 171* 161*  BUN 13 14 11   CREATININE 0.78 0.76 0.64  CALCIUM 9.3 9.7 9.3   Liver Function Tests:  Recent Labs  11/07/14 0811 03/27/15 0922 07/24/15 0933  AST 47* 46* 41*  ALT 48* 40* 42*  ALKPHOS 75 77 83  BILITOT 0.5 0.6 0.5  PROT 6.9 7.0 7.2  ALBUMIN 3.8 3.9 3.7   No results for input(s): LIPASE, AMYLASE in the last 8760 hours. No results for input(s): AMMONIA in the last 8760 hours. CBC:  Recent Labs  05/09/15 2356 06/02/15 1458  WBC 4.4 5.0  NEUTROABS 2.5 3.1  HGB 11.1* 12.2  HCT 33.2* 36.8  MCV 88.3 87.6  PLT 112* 131*   Lipid Panel:  Recent Labs  11/07/14 0811 03/27/15 0922 07/24/15 0933  CHOL 234* 225* 194  HDL 81 81 65  LDLCALC 141* 132* 112*  TRIG 62 62 87  CHOLHDL 2.9 2.8 3.0   Lab Results  Component Value Date   HGBA1C 9.3* 07/24/2015    Assessment/Plan  1. Essential hypertension - Basic metabolic panel; Future  2. Controlled type 2 diabetes mellitus with hyperglycemia, with long-term current use of insulin (HCC) Although hemoglobin A1c has risen, patient expects to be able to do better with her diet. She is on 40 units of insulin at the present time. She had to switch to Levemir due to insurance purposes. She doesn't think it is covering her diabetes quite as well as  the Lantus, however there are other issues going on in the last quarter. She will remain on 40 units of Lantus. - Hemoglobin A1c; Future - Basic metabolic panel; Future  3. Hyperlipemia - Lipid panel; Future - Basic metabolic panel; Future  4. S/P lumbar spinal fusion Postoperative course suggests she is steadily improving.

## 2015-08-03 DIAGNOSIS — H04123 Dry eye syndrome of bilateral lacrimal glands: Secondary | ICD-10-CM | POA: Diagnosis not present

## 2015-08-03 DIAGNOSIS — H02205 Unspecified lagophthalmos left lower eyelid: Secondary | ICD-10-CM | POA: Diagnosis not present

## 2015-08-03 DIAGNOSIS — E119 Type 2 diabetes mellitus without complications: Secondary | ICD-10-CM | POA: Diagnosis not present

## 2015-08-03 DIAGNOSIS — H02202 Unspecified lagophthalmos right lower eyelid: Secondary | ICD-10-CM | POA: Diagnosis not present

## 2015-08-03 DIAGNOSIS — H2513 Age-related nuclear cataract, bilateral: Secondary | ICD-10-CM | POA: Diagnosis not present

## 2015-08-03 DIAGNOSIS — H40013 Open angle with borderline findings, low risk, bilateral: Secondary | ICD-10-CM | POA: Diagnosis not present

## 2015-08-03 DIAGNOSIS — H524 Presbyopia: Secondary | ICD-10-CM | POA: Diagnosis not present

## 2015-08-03 DIAGNOSIS — H02204 Unspecified lagophthalmos left upper eyelid: Secondary | ICD-10-CM | POA: Diagnosis not present

## 2015-08-03 DIAGNOSIS — H02201 Unspecified lagophthalmos right upper eyelid: Secondary | ICD-10-CM | POA: Diagnosis not present

## 2015-08-07 ENCOUNTER — Ambulatory Visit
Admission: RE | Admit: 2015-08-07 | Discharge: 2015-08-07 | Disposition: A | Discharge status: Home / Self Care | Payer: Medicare Other | Source: Ambulatory Visit

## 2015-08-07 DIAGNOSIS — Z1231 Encounter for screening mammogram for malignant neoplasm of breast: Secondary | ICD-10-CM | POA: Diagnosis not present

## 2015-09-24 ENCOUNTER — Telehealth: Payer: Self-pay | Admitting: *Deleted

## 2015-09-24 DIAGNOSIS — Z0110 Encounter for examination of ears and hearing without abnormal findings: Secondary | ICD-10-CM

## 2015-09-24 NOTE — Telephone Encounter (Signed)
Approved.  

## 2015-09-24 NOTE — Telephone Encounter (Signed)
Patient called and stated that she has an appointment for hearing evaluation on Monday at [facility] and needs a referral from PCP.  Referral placed.

## 2015-10-08 ENCOUNTER — Other Ambulatory Visit: Payer: Self-pay | Admitting: Internal Medicine

## 2015-10-12 DIAGNOSIS — Z570 Occupational exposure to noise: Secondary | ICD-10-CM | POA: Diagnosis not present

## 2015-10-12 DIAGNOSIS — E1169 Type 2 diabetes mellitus with other specified complication: Secondary | ICD-10-CM | POA: Diagnosis not present

## 2015-10-12 DIAGNOSIS — H903 Sensorineural hearing loss, bilateral: Secondary | ICD-10-CM | POA: Diagnosis not present

## 2015-10-12 DIAGNOSIS — Z822 Family history of deafness and hearing loss: Secondary | ICD-10-CM | POA: Diagnosis not present

## 2015-10-26 DIAGNOSIS — M4316 Spondylolisthesis, lumbar region: Secondary | ICD-10-CM | POA: Diagnosis not present

## 2015-11-25 ENCOUNTER — Encounter: Payer: Self-pay | Admitting: Gastroenterology

## 2015-12-01 NOTE — Addendum Note (Signed)
Addended by: Moshe Cipro MESHELL A on: 12/01/2015 04:22 PM   Modules accepted: Orders

## 2015-12-08 ENCOUNTER — Encounter: Payer: Self-pay | Admitting: Gastroenterology

## 2015-12-11 ENCOUNTER — Other Ambulatory Visit: Payer: Medicare Other

## 2015-12-11 DIAGNOSIS — Z794 Long term (current) use of insulin: Secondary | ICD-10-CM | POA: Diagnosis not present

## 2015-12-11 DIAGNOSIS — E785 Hyperlipidemia, unspecified: Secondary | ICD-10-CM | POA: Diagnosis not present

## 2015-12-11 DIAGNOSIS — E1165 Type 2 diabetes mellitus with hyperglycemia: Secondary | ICD-10-CM | POA: Diagnosis not present

## 2015-12-11 DIAGNOSIS — I10 Essential (primary) hypertension: Secondary | ICD-10-CM | POA: Diagnosis not present

## 2015-12-11 LAB — BASIC METABOLIC PANEL
BUN: 10 mg/dL (ref 7–25)
CALCIUM: 9.1 mg/dL (ref 8.6–10.4)
CO2: 28 mmol/L (ref 20–31)
CREATININE: 0.66 mg/dL (ref 0.50–0.99)
Chloride: 105 mmol/L (ref 98–110)
Glucose, Bld: 100 mg/dL — ABNORMAL HIGH (ref 65–99)
Potassium: 4.1 mmol/L (ref 3.5–5.3)
Sodium: 143 mmol/L (ref 135–146)

## 2015-12-11 LAB — LIPID PANEL
Cholesterol: 198 mg/dL (ref 125–200)
HDL: 78 mg/dL (ref >=46)
LDL CALC: 106 mg/dL (ref <130)
TRIGLYCERIDES: 72 mg/dL (ref <150)
Total CHOL/HDL Ratio: 2.5 Ratio (ref <=5.0)
VLDL: 14 mg/dL (ref <30)

## 2015-12-11 LAB — HEMOGLOBIN A1C
HEMOGLOBIN A1C: 9.7 % — AB (ref <5.7)
Mean Plasma Glucose: 232 mg/dL

## 2015-12-15 ENCOUNTER — Encounter: Payer: Self-pay | Admitting: Internal Medicine

## 2015-12-15 ENCOUNTER — Ambulatory Visit (INDEPENDENT_AMBULATORY_CARE_PROVIDER_SITE_OTHER): Payer: Medicare Other | Admitting: Internal Medicine

## 2015-12-15 VITALS — BP 142/72 | HR 70 | Temp 98.1°F | Ht 63.0 in | Wt 155.0 lb

## 2015-12-15 DIAGNOSIS — E1165 Type 2 diabetes mellitus with hyperglycemia: Secondary | ICD-10-CM | POA: Diagnosis not present

## 2015-12-15 DIAGNOSIS — M7062 Trochanteric bursitis, left hip: Secondary | ICD-10-CM | POA: Diagnosis not present

## 2015-12-15 DIAGNOSIS — IMO0002 Reserved for concepts with insufficient information to code with codable children: Secondary | ICD-10-CM | POA: Insufficient documentation

## 2015-12-15 DIAGNOSIS — Z794 Long term (current) use of insulin: Secondary | ICD-10-CM

## 2015-12-15 DIAGNOSIS — E785 Hyperlipidemia, unspecified: Secondary | ICD-10-CM

## 2015-12-15 DIAGNOSIS — E119 Type 2 diabetes mellitus without complications: Secondary | ICD-10-CM | POA: Insufficient documentation

## 2015-12-15 DIAGNOSIS — K219 Gastro-esophageal reflux disease without esophagitis: Secondary | ICD-10-CM | POA: Diagnosis not present

## 2015-12-15 DIAGNOSIS — IMO0001 Reserved for inherently not codable concepts without codable children: Secondary | ICD-10-CM

## 2015-12-15 DIAGNOSIS — I10 Essential (primary) hypertension: Secondary | ICD-10-CM

## 2015-12-15 HISTORY — DX: Trochanteric bursitis, left hip: M70.62

## 2015-12-15 MED ORDER — MELOXICAM 15 MG PO TABS: ORAL_TABLET | ORAL | 5 refills | Status: DC

## 2015-12-15 NOTE — Patient Instructions (Signed)
Increase insun too 45 units at bedtime

## 2015-12-15 NOTE — Progress Notes (Signed)
Facility  [facility]    Place of Service:   OFFICE    Allergies  Allergen Reactions  . Kiwi Extract Anaphylaxis  . Tdap [Diphth-Acell Pertussis-Tetanus] Swelling    Swelling at injection site, gets very hot  . Tramadol Nausea And Vomiting  . Latex Itching, Dermatitis and Rash    Chief Complaint  Patient presents with  . Medical Management of Chronic Issues    4 month medication management blood pressure, blood sugar, cholesterol. BS this morning 130, range 60-180. Checks BS once a day in morning. Here with husband  . Hip Pain    left for couple of months, off and on.     HPI:  Uncontrolled type 2 diabetes mellitus without complication, with long-term current use of insulin (HCC) - Poor control with rising A1c over the last 8 months. She is not always dietary compliance.   Essential hypertension - controlled  Gastroesophageal reflux disease without esophagitis - asymptomatic  Hyperlipemia - controlled  Trochanteric bursitis of left hip - tender left greater trochanter. Patient currently on Mobic 7.5 mg daily. Doesn't seem to help very much.    Medications: Patient's Medications  New Prescriptions   No medications on file  Previous Medications   AMBULATORY NON FORMULARY MEDICATION    One Touch Ultra Blue Test Strips Sig: Use to test blood sugar three times daily to control blood sugar Dx: E11.9   CALCIUM CARBONATE (OS-CAL) 600 MG TABS    Take 600 mg by mouth 2 (two) times daily with a meal.     CHOLECALCIFEROL (VITAMIN D) 1000 UNITS TABLET    Take 1,000 Units by mouth daily.     CINNAMON 500 MG TABS    Take 1,000 mg by mouth daily.    CIPROFLOXACIN (CIPRO) 250 MG TABLET    Take 250 mg by mouth 2 (two) times daily as needed (to prevent UTIs). Take one tablet after intercourse to prevent urinary tract infection   CYANOCOBALAMIN (VITAMIN B 12 PO)    Take 1,000 mcg by mouth daily.     FISH OIL-OMEGA-3 FATTY ACIDS 1000 MG CAPSULE    Take 1 g by mouth daily.     FLUTICASONE  (FLONASE) 50 MCG/ACT NASAL SPRAY    Place 2 sprays into both nostrils 2 (two) times daily. Decrease to 2 sprays/nostril daily after 5 days   GLUCOSE BLOOD TEST STRIP    Use as instructed to test blood sugar three times daily  Dx  250.00   INSULIN DETEMIR (LEVEMIR) 100 UNIT/ML INJECTION    Inject into the skin. Inject 40 units at bedtime to control blood sugar   INSULIN SYRINGE-NEEDLE U-100 (INSULIN SYRINGE .5CC/31GX5/16") 31G X 5/16" 0.5 ML MISC    Use daily with the administration of insulin E11.9   LORATADINE (CLARITIN) 10 MG TABLET    Take 10 mg by mouth daily.     MELOXICAM (MOBIC) 7.5 MG TABLET    TAKE 1 TABLET BY MOUTH EVERY DAY FOR ARTHRITIS OR PAIN   METFORMIN (GLUCOPHAGE) 1000 MG TABLET    Take one tablet by mouth twice daily to treat diabetes   MULTIPLE VITAMINS-MINERALS (MULTIVITAMIN WITH MINERALS) TABLET    Take 1 tablet by mouth daily.     OMEPRAZOLE (PRILOSEC) 20 MG CAPSULE    TAKE ONE CAPSULE BY MOUTH DAILY  Modified Medications   No medications on file  Discontinued Medications   INSULIN DETEMIR (LEVEMIR) 100 UNIT/ML INJECTION    Inject 30-35 units into skin at bedtime to  control blood sugar    Review of Systems  Constitutional: Negative.   HENT: Positive for hearing loss and postnasal drip.   Eyes: Negative.   Respiratory: Negative.   Cardiovascular: Positive for chest pain. Negative for palpitations and leg swelling.  Gastrointestinal:       Occasional heartburn  Endocrine:       Diabetic  Musculoskeletal: Positive for arthralgias, back pain and myalgias. Negative for gait problem and joint swelling.       Pain left greater trochanter.  Skin:       Complains of thinning hair  Neurological:       Distal paresthesias of tingling and burning in the feet and lower legs  Hematological: Negative.   Psychiatric/Behavioral: Positive for sleep disturbance (insomnia).    Vitals:   12/15/15 1434  BP: (!) 142/72  Pulse: 70  Temp: 98.1 F (36.7 C)  TempSrc: Oral  SpO2:  99%  Weight: 155 lb (70.3 kg)  Height: 5' 3"  (1.6 m)   Body mass index is 27.46 kg/m. Wt Readings from Last 3 Encounters:  12/15/15 155 lb (70.3 kg)  07/28/15 153 lb 9.6 oz (69.7 kg)  06/02/15 156 lb 12.8 oz (71.1 kg)      Physical Exam  Constitutional: She is oriented to person, place, and time. She appears well-developed and well-nourished. No distress.  HENT:  Head: Normocephalic and atraumatic.  Nose: Nose normal.  Bilateral hearing aids  Eyes: Conjunctivae and EOM are normal. Pupils are equal, round, and reactive to light.  Neck: Neck supple. No JVD present. No tracheal deviation present. No thyromegaly present.  Cardiovascular: Normal rate, regular rhythm, normal heart sounds and intact distal pulses.  Exam reveals no gallop and no friction rub.   No murmur heard. Pulmonary/Chest: Effort normal and breath sounds normal. No respiratory distress. She has no wheezes. She has no rales.  Abdominal: Bowel sounds are normal. She exhibits no distension and no mass. There is no tenderness.  Musculoskeletal: Normal range of motion. She exhibits no edema or tenderness.  Patient has evidence of  rupture long head of the biceps proximally.in the right mid upper arm. It is not tender. Previous bruising is resolved. The muscle is balled up in the mid right upper arm. Tender to percussion in the lower back. Plan left greater trochanter to palpation.  Lymphadenopathy:    She has no cervical adenopathy.  Neurological: She is alert and oriented to person, place, and time. She has normal reflexes. No cranial nerve deficit. Coordination normal.  Skin: No rash noted. No erythema. No pallor.  Hair is quite thin over the scalp.  Psychiatric: She has a normal mood and affect. Her behavior is normal. Thought content normal.    Labs reviewed: Lab Summary Latest Ref Rng & Units 12/11/2015 07/24/2015 06/02/2015  Hemoglobin 12.0 - 15.0 g/dL (None) (None) 12.2  Hematocrit 36.0 - 46.0 % (None) (None) 36.8   White count 4.0 - 10.5 K/uL (None) (None) 5.0  Platelet count 150 - 400 K/uL (None) (None) 131(L)  Sodium 135 - 146 mmol/L 143 140 139  Potassium 3.5 - 5.3 mmol/L 4.1 4.0 3.4(L)  Calcium 8.6 - 10.4 mg/dL 9.1 9.3 9.7  Phosphorus - (None) (None) (None)  Creatinine 0.50 - 0.99 mg/dL 0.66 0.64 0.76  AST 0 - 40 IU/L (None) 41(H) (None)  Alk Phos 39 - 117 IU/L (None) 83 (None)  Bilirubin 0.0 - 1.2 mg/dL (None) 0.5 (None)  Glucose 65 - 99 mg/dL 100(H) 161(H) 171(H)  Cholesterol 125 -  200 mg/dL 198 (None) (None)  HDL cholesterol >=46 mg/dL 78 65 (None)  Triglycerides <150 mg/dL 72 87 (None)  LDL Direct - (None) (None) (None)  LDL Calc <130 mg/dL 106 112(H) (None)  Total protein - (None) (None) (None)  Albumin 3.6 - 4.8 g/dL (None) 3.7 (None)  Some recent data might be hidden   Lab Results  Component Value Date   TSH 2.150 06/23/2014   Lab Results  Component Value Date   BUN 10 12/11/2015   BUN 11 07/24/2015   BUN 14 06/02/2015   Lab Results  Component Value Date   HGBA1C 9.7 (H) 12/11/2015   HGBA1C 9.3 (H) 07/24/2015   HGBA1C 8.8 (H) 06/02/2015    Assessment/Plan  1. Uncontrolled type 2 diabetes mellitus without complication, with long-term current use of insulin (HCC) - insulin detemir (LEVEMIR) 100 UNIT/ML injection; Inject into the skin. Inject 45 units at bedtime to control blood sugar  - Basic metabolic panel; Future - Hemoglobin A1c; Future  2. Essential hypertension controlled  3. Gastroesophageal reflux disease without esophagitis -some risk of problems with higher dose of meloxicam. Discussed eith patient.  4. Hyperlipemia controlled  5. Trochanteric bursitis of left hip -try ice massage -educational re[print from [facility] - meloxicam (MOBIC) 15 MG tablet; One daily to help arthritis and bursitis  Dispense: 30 tablet; Refill: 5

## 2015-12-19 ENCOUNTER — Other Ambulatory Visit: Payer: Self-pay | Admitting: Internal Medicine

## 2015-12-28 ENCOUNTER — Ambulatory Visit: Payer: Medicare Other | Admitting: *Deleted

## 2015-12-28 VITALS — Ht 62.5 in | Wt 156.8 lb

## 2015-12-28 DIAGNOSIS — Z86010 Personal history of colonic polyps: Secondary | ICD-10-CM

## 2015-12-28 MED ORDER — SUPREP BOWEL PREP KIT 17.5-3.13-1.6 GM/177ML PO SOLN: 1.0000 | Freq: Once | ORAL | 0 refills | Status: AC

## 2015-12-28 NOTE — Progress Notes (Signed)
Patient denies any allergies to egg or soy products. Patient denies complications with anesthesia/sedation.  Patient denies oxygen use at home and denies diet medications. Emmi instructions for colonoscopy explained but patient denied.

## 2016-01-08 DIAGNOSIS — I2510 Atherosclerotic heart disease of native coronary artery without angina pectoris: Secondary | ICD-10-CM

## 2016-01-08 HISTORY — DX: Atherosclerotic heart disease of native coronary artery without angina pectoris: I25.10

## 2016-01-18 DIAGNOSIS — M7062 Trochanteric bursitis, left hip: Secondary | ICD-10-CM | POA: Diagnosis not present

## 2016-01-18 DIAGNOSIS — M4316 Spondylolisthesis, lumbar region: Secondary | ICD-10-CM | POA: Diagnosis not present

## 2016-01-26 ENCOUNTER — Encounter (HOSPITAL_COMMUNITY): Payer: Self-pay | Admitting: *Deleted

## 2016-01-26 DIAGNOSIS — I214 Non-ST elevation (NSTEMI) myocardial infarction: Secondary | ICD-10-CM | POA: Diagnosis not present

## 2016-01-26 DIAGNOSIS — R197 Diarrhea, unspecified: Secondary | ICD-10-CM | POA: Diagnosis not present

## 2016-01-26 DIAGNOSIS — E1122 Type 2 diabetes mellitus with diabetic chronic kidney disease: Secondary | ICD-10-CM | POA: Diagnosis not present

## 2016-01-26 DIAGNOSIS — K219 Gastro-esophageal reflux disease without esophagitis: Secondary | ICD-10-CM | POA: Diagnosis present

## 2016-01-26 DIAGNOSIS — D62 Acute posthemorrhagic anemia: Secondary | ICD-10-CM | POA: Diagnosis not present

## 2016-01-26 DIAGNOSIS — I252 Old myocardial infarction: Secondary | ICD-10-CM

## 2016-01-26 DIAGNOSIS — J9811 Atelectasis: Secondary | ICD-10-CM | POA: Diagnosis not present

## 2016-01-26 DIAGNOSIS — Z79899 Other long term (current) drug therapy: Secondary | ICD-10-CM

## 2016-01-26 DIAGNOSIS — J90 Pleural effusion, not elsewhere classified: Secondary | ICD-10-CM | POA: Diagnosis not present

## 2016-01-26 DIAGNOSIS — Z8249 Family history of ischemic heart disease and other diseases of the circulatory system: Secondary | ICD-10-CM

## 2016-01-26 DIAGNOSIS — E785 Hyperlipidemia, unspecified: Secondary | ICD-10-CM | POA: Diagnosis present

## 2016-01-26 DIAGNOSIS — I2510 Atherosclerotic heart disease of native coronary artery without angina pectoris: Secondary | ICD-10-CM | POA: Diagnosis present

## 2016-01-26 DIAGNOSIS — Z7982 Long term (current) use of aspirin: Secondary | ICD-10-CM

## 2016-01-26 DIAGNOSIS — I2582 Chronic total occlusion of coronary artery: Secondary | ICD-10-CM | POA: Diagnosis present

## 2016-01-26 DIAGNOSIS — Z794 Long term (current) use of insulin: Secondary | ICD-10-CM

## 2016-01-26 DIAGNOSIS — Z981 Arthrodesis status: Secondary | ICD-10-CM

## 2016-01-26 DIAGNOSIS — R0789 Other chest pain: Secondary | ICD-10-CM | POA: Diagnosis not present

## 2016-01-26 DIAGNOSIS — N181 Chronic kidney disease, stage 1: Secondary | ICD-10-CM | POA: Diagnosis present

## 2016-01-26 DIAGNOSIS — Z791 Long term (current) use of non-steroidal anti-inflammatories (NSAID): Secondary | ICD-10-CM

## 2016-01-26 DIAGNOSIS — D696 Thrombocytopenia, unspecified: Secondary | ICD-10-CM | POA: Diagnosis not present

## 2016-01-26 DIAGNOSIS — E876 Hypokalemia: Secondary | ICD-10-CM | POA: Diagnosis not present

## 2016-01-26 DIAGNOSIS — I493 Ventricular premature depolarization: Secondary | ICD-10-CM | POA: Diagnosis present

## 2016-01-26 DIAGNOSIS — R079 Chest pain, unspecified: Secondary | ICD-10-CM | POA: Diagnosis not present

## 2016-01-26 DIAGNOSIS — M7062 Trochanteric bursitis, left hip: Secondary | ICD-10-CM | POA: Diagnosis not present

## 2016-01-26 DIAGNOSIS — I340 Nonrheumatic mitral (valve) insufficiency: Secondary | ICD-10-CM | POA: Diagnosis present

## 2016-01-26 DIAGNOSIS — I249 Acute ischemic heart disease, unspecified: Secondary | ICD-10-CM | POA: Diagnosis not present

## 2016-01-26 DIAGNOSIS — E8770 Fluid overload, unspecified: Secondary | ICD-10-CM | POA: Diagnosis not present

## 2016-01-26 DIAGNOSIS — I129 Hypertensive chronic kidney disease with stage 1 through stage 4 chronic kidney disease, or unspecified chronic kidney disease: Secondary | ICD-10-CM | POA: Diagnosis present

## 2016-01-26 DIAGNOSIS — I10 Essential (primary) hypertension: Secondary | ICD-10-CM | POA: Diagnosis not present

## 2016-01-26 NOTE — ED Triage Notes (Signed)
Pt c/o generalized chest pressure since 2200 tonight. Pt denies any associated symptoms.

## 2016-01-27 ENCOUNTER — Encounter (HOSPITAL_COMMUNITY)
Admission: EM | Disposition: A | Discharge status: Home / Self Care | Payer: Self-pay | Source: Home / Self Care | Attending: Thoracic Surgery (Cardiothoracic Vascular Surgery)

## 2016-01-27 ENCOUNTER — Other Ambulatory Visit: Payer: Self-pay | Admitting: *Deleted

## 2016-01-27 ENCOUNTER — Inpatient Hospital Stay (HOSPITAL_COMMUNITY): Payer: Medicare Other

## 2016-01-27 ENCOUNTER — Inpatient Hospital Stay (HOSPITAL_COMMUNITY)
Admission: EM | Admit: 2016-01-27 | Discharge: 2016-02-03 | DRG: 234 | Disposition: A | Discharge status: Home / Self Care | Payer: Medicare Other | Attending: Thoracic Surgery (Cardiothoracic Vascular Surgery) | Admitting: Thoracic Surgery (Cardiothoracic Vascular Surgery)

## 2016-01-27 ENCOUNTER — Emergency Department (HOSPITAL_COMMUNITY): Payer: Medicare Other

## 2016-01-27 ENCOUNTER — Encounter (HOSPITAL_COMMUNITY): Payer: Self-pay | Admitting: Interventional Cardiology

## 2016-01-27 DIAGNOSIS — I214 Non-ST elevation (NSTEMI) myocardial infarction: Secondary | ICD-10-CM | POA: Diagnosis not present

## 2016-01-27 DIAGNOSIS — Z951 Presence of aortocoronary bypass graft: Secondary | ICD-10-CM

## 2016-01-27 DIAGNOSIS — R0789 Other chest pain: Secondary | ICD-10-CM | POA: Diagnosis not present

## 2016-01-27 DIAGNOSIS — Z79899 Other long term (current) drug therapy: Secondary | ICD-10-CM | POA: Diagnosis not present

## 2016-01-27 DIAGNOSIS — I493 Ventricular premature depolarization: Secondary | ICD-10-CM | POA: Diagnosis present

## 2016-01-27 DIAGNOSIS — I10 Essential (primary) hypertension: Secondary | ICD-10-CM

## 2016-01-27 DIAGNOSIS — E785 Hyperlipidemia, unspecified: Secondary | ICD-10-CM | POA: Diagnosis not present

## 2016-01-27 DIAGNOSIS — E1165 Type 2 diabetes mellitus with hyperglycemia: Secondary | ICD-10-CM | POA: Diagnosis not present

## 2016-01-27 DIAGNOSIS — Z794 Long term (current) use of insulin: Secondary | ICD-10-CM | POA: Diagnosis not present

## 2016-01-27 DIAGNOSIS — I252 Old myocardial infarction: Secondary | ICD-10-CM | POA: Diagnosis not present

## 2016-01-27 DIAGNOSIS — Z981 Arthrodesis status: Secondary | ICD-10-CM | POA: Diagnosis not present

## 2016-01-27 DIAGNOSIS — I25110 Atherosclerotic heart disease of native coronary artery with unstable angina pectoris: Secondary | ICD-10-CM | POA: Diagnosis not present

## 2016-01-27 DIAGNOSIS — Z791 Long term (current) use of non-steroidal anti-inflammatories (NSAID): Secondary | ICD-10-CM | POA: Diagnosis not present

## 2016-01-27 DIAGNOSIS — I249 Acute ischemic heart disease, unspecified: Secondary | ICD-10-CM

## 2016-01-27 DIAGNOSIS — I2510 Atherosclerotic heart disease of native coronary artery without angina pectoris: Secondary | ICD-10-CM

## 2016-01-27 DIAGNOSIS — J9811 Atelectasis: Secondary | ICD-10-CM

## 2016-01-27 DIAGNOSIS — I080 Rheumatic disorders of both mitral and aortic valves: Secondary | ICD-10-CM | POA: Diagnosis not present

## 2016-01-27 DIAGNOSIS — D62 Acute posthemorrhagic anemia: Secondary | ICD-10-CM | POA: Diagnosis not present

## 2016-01-27 DIAGNOSIS — I129 Hypertensive chronic kidney disease with stage 1 through stage 4 chronic kidney disease, or unspecified chronic kidney disease: Secondary | ICD-10-CM | POA: Diagnosis present

## 2016-01-27 DIAGNOSIS — D696 Thrombocytopenia, unspecified: Secondary | ICD-10-CM | POA: Diagnosis not present

## 2016-01-27 DIAGNOSIS — Z01810 Encounter for preprocedural cardiovascular examination: Secondary | ICD-10-CM | POA: Diagnosis not present

## 2016-01-27 DIAGNOSIS — N181 Chronic kidney disease, stage 1: Secondary | ICD-10-CM | POA: Diagnosis present

## 2016-01-27 DIAGNOSIS — J90 Pleural effusion, not elsewhere classified: Secondary | ICD-10-CM | POA: Diagnosis not present

## 2016-01-27 DIAGNOSIS — E8770 Fluid overload, unspecified: Secondary | ICD-10-CM | POA: Diagnosis not present

## 2016-01-27 DIAGNOSIS — E119 Type 2 diabetes mellitus without complications: Secondary | ICD-10-CM

## 2016-01-27 DIAGNOSIS — I340 Nonrheumatic mitral (valve) insufficiency: Secondary | ICD-10-CM | POA: Diagnosis present

## 2016-01-27 DIAGNOSIS — I213 ST elevation (STEMI) myocardial infarction of unspecified site: Secondary | ICD-10-CM

## 2016-01-27 DIAGNOSIS — I2582 Chronic total occlusion of coronary artery: Secondary | ICD-10-CM | POA: Diagnosis present

## 2016-01-27 DIAGNOSIS — R197 Diarrhea, unspecified: Secondary | ICD-10-CM | POA: Diagnosis not present

## 2016-01-27 DIAGNOSIS — K219 Gastro-esophageal reflux disease without esophagitis: Secondary | ICD-10-CM | POA: Diagnosis present

## 2016-01-27 DIAGNOSIS — Z7982 Long term (current) use of aspirin: Secondary | ICD-10-CM | POA: Diagnosis not present

## 2016-01-27 DIAGNOSIS — R079 Chest pain, unspecified: Secondary | ICD-10-CM | POA: Diagnosis not present

## 2016-01-27 DIAGNOSIS — Z8249 Family history of ischemic heart disease and other diseases of the circulatory system: Secondary | ICD-10-CM | POA: Diagnosis not present

## 2016-01-27 DIAGNOSIS — E1122 Type 2 diabetes mellitus with diabetic chronic kidney disease: Secondary | ICD-10-CM | POA: Diagnosis present

## 2016-01-27 DIAGNOSIS — E876 Hypokalemia: Secondary | ICD-10-CM | POA: Diagnosis not present

## 2016-01-27 HISTORY — PX: CARDIAC CATHETERIZATION: SHX172

## 2016-01-27 HISTORY — DX: Old myocardial infarction: I25.2

## 2016-01-27 LAB — URINALYSIS, ROUTINE W REFLEX MICROSCOPIC
Bilirubin Urine: NEGATIVE
GLUCOSE, UA: NEGATIVE mg/dL
Ketones, ur: NEGATIVE mg/dL
LEUKOCYTES UA: NEGATIVE
NITRITE: NEGATIVE
PROTEIN: NEGATIVE mg/dL
Specific Gravity, Urine: 1.013 (ref 1.005–1.030)
pH: 7 (ref 5.0–8.0)

## 2016-01-27 LAB — CBG MONITORING, ED: Glucose-Capillary: 243 mg/dL — ABNORMAL HIGH (ref 65–99)

## 2016-01-27 LAB — PROTIME-INR
INR: 1.23
Prothrombin Time: 15.5 seconds — ABNORMAL HIGH (ref 11.4–15.2)

## 2016-01-27 LAB — TROPONIN I
TROPONIN I: 2.19 ng/mL — AB (ref <0.03)
TROPONIN I: 6.98 ng/mL — AB (ref <0.03)
Troponin I: 0.61 ng/mL (ref <0.03)

## 2016-01-27 LAB — SPIROMETRY WITH GRAPH
FEF 25-75 POST: 2.22 L/s
FEF 25-75 Pre: 1.48 L/sec
FEF2575-%CHANGE-POST: 49 %
FEF2575-%PRED-POST: 121 %
FEF2575-%PRED-PRE: 81 %
FEV1-%Change-Post: 7 %
FEV1-%Pred-Post: 95 %
FEV1-%Pred-Pre: 88 %
FEV1-Post: 2.02 L
FEV1-Pre: 1.87 L
FEV1FVC-%CHANGE-POST: 8 %
FEV1FVC-%PRED-PRE: 99 %
FEV6-%Change-Post: 0 %
FEV6-%PRED-PRE: 92 %
FEV6-%Pred-Post: 92 %
FEV6-Post: 2.46 L
FEV6-Pre: 2.46 L
FEV6FVC-%Change-Post: 0 %
FEV6FVC-%Pred-Post: 104 %
FEV6FVC-%Pred-Pre: 103 %
FVC-%Change-Post: 0 %
FVC-%PRED-PRE: 89 %
FVC-%Pred-Post: 88 %
FVC-POST: 2.46 L
FVC-PRE: 2.48 L
POST FEV6/FVC RATIO: 100 %
PRE FEV1/FVC RATIO: 75 %
Post FEV1/FVC ratio: 82 %
Pre FEV6/FVC Ratio: 99 %

## 2016-01-27 LAB — HEPARIN LEVEL (UNFRACTIONATED): HEPARIN UNFRACTIONATED: 0.45 [IU]/mL (ref 0.30–0.70)

## 2016-01-27 LAB — BASIC METABOLIC PANEL
ANION GAP: 16 — AB (ref 5–15)
BUN: 11 mg/dL (ref 6–20)
CALCIUM: 9.7 mg/dL (ref 8.9–10.3)
CO2: 20 mmol/L — AB (ref 22–32)
CREATININE: 0.82 mg/dL (ref 0.44–1.00)
Chloride: 99 mmol/L — ABNORMAL LOW (ref 101–111)
Glucose, Bld: 429 mg/dL — ABNORMAL HIGH (ref 65–99)
Potassium: 3.8 mmol/L (ref 3.5–5.1)
SODIUM: 135 mmol/L (ref 135–145)

## 2016-01-27 LAB — I-STAT TROPONIN, ED
TROPONIN I, POC: 0 ng/mL (ref 0.00–0.08)
TROPONIN I, POC: 0.14 ng/mL — AB (ref 0.00–0.08)

## 2016-01-27 LAB — URINE MICROSCOPIC-ADD ON

## 2016-01-27 LAB — GLUCOSE, CAPILLARY
GLUCOSE-CAPILLARY: 206 mg/dL — AB (ref 65–99)
GLUCOSE-CAPILLARY: 166 mg/dL — AB (ref 65–99)
Glucose-Capillary: 189 mg/dL — ABNORMAL HIGH (ref 65–99)
Glucose-Capillary: 233 mg/dL — ABNORMAL HIGH (ref 65–99)

## 2016-01-27 LAB — MRSA PCR SCREENING: MRSA by PCR: NEGATIVE

## 2016-01-27 LAB — CBC
HCT: 35.3 % — ABNORMAL LOW (ref 36.0–46.0)
HEMOGLOBIN: 11.1 g/dL — AB (ref 12.0–15.0)
MCH: 26.4 pg (ref 26.0–34.0)
MCHC: 31.4 g/dL (ref 30.0–36.0)
MCV: 84 fL (ref 78.0–100.0)
PLATELETS: 156 10*3/uL (ref 150–400)
RBC: 4.2 MIL/uL (ref 3.87–5.11)
RDW: 15.5 % (ref 11.5–15.5)
WBC: 5.6 10*3/uL (ref 4.0–10.5)

## 2016-01-27 LAB — CARDIAC CATHETERIZATION: Cath EF Quantitative: 50 %

## 2016-01-27 SURGERY — LEFT HEART CATH AND CORONARY ANGIOGRAPHY

## 2016-01-27 MED ORDER — PLASMA-LYTE 148 IV SOLN
INTRAVENOUS | Status: AC
  Administered 2016-01-28: 500 mL
  Filled 2016-01-27: qty 2.5

## 2016-01-27 MED ORDER — NITROGLYCERIN IN D5W 200-5 MCG/ML-% IV SOLN
2.0000 ug/min | INTRAVENOUS | Status: AC
  Administered 2016-01-28: 20 ug/min via INTRAVENOUS
  Filled 2016-01-27: qty 250

## 2016-01-27 MED ORDER — ASPIRIN 81 MG PO CHEW
324.0000 mg | CHEWABLE_TABLET | ORAL | Status: AC
  Administered 2016-01-27: 324 mg via ORAL
  Filled 2016-01-27: qty 4

## 2016-01-27 MED ORDER — ALPRAZOLAM 0.25 MG PO TABS: 0.2500 mg | ORAL_TABLET | ORAL | Status: DC | PRN

## 2016-01-27 MED ORDER — DIAZEPAM 5 MG PO TABS
5.0000 mg | ORAL_TABLET | Freq: Once | ORAL | Status: AC
  Administered 2016-01-28: 5 mg via ORAL
  Filled 2016-01-27: qty 1

## 2016-01-27 MED ORDER — BISACODYL 5 MG PO TBEC
5.0000 mg | DELAYED_RELEASE_TABLET | Freq: Once | ORAL | Status: AC
  Administered 2016-01-27: 5 mg via ORAL
  Filled 2016-01-27: qty 1

## 2016-01-27 MED ORDER — HEPARIN (PORCINE) IN NACL 100-0.45 UNIT/ML-% IJ SOLN
800.0000 [IU]/h | INTRAMUSCULAR | Status: DC
  Administered 2016-01-27: 800 [IU]/h via INTRAVENOUS
  Filled 2016-01-27: qty 250

## 2016-01-27 MED ORDER — CEFUROXIME SODIUM 750 MG IJ SOLR
750.0000 mg | INTRAMUSCULAR | Status: DC
  Filled 2016-01-27: qty 750

## 2016-01-27 MED ORDER — ALBUTEROL SULFATE (2.5 MG/3ML) 0.083% IN NEBU
2.5000 mg | INHALATION_SOLUTION | Freq: Once | RESPIRATORY_TRACT | Status: AC
  Administered 2016-01-27: 2.5 mg via RESPIRATORY_TRACT

## 2016-01-27 MED ORDER — TEMAZEPAM 15 MG PO CAPS: 15.0000 mg | ORAL_CAPSULE | Freq: Once | ORAL | Status: DC | PRN

## 2016-01-27 MED ORDER — ASPIRIN 300 MG RE SUPP: 300.0000 mg | RECTAL | Status: AC

## 2016-01-27 MED ORDER — ACETAMINOPHEN 325 MG PO TABS: 650.0000 mg | ORAL_TABLET | ORAL | Status: DC | PRN

## 2016-01-27 MED ORDER — DEXTROSE 5 % IV SOLN
1.5000 g | INTRAVENOUS | Status: AC
  Administered 2016-01-28: 1.5 g via INTRAVENOUS
  Administered 2016-01-28: .75 g via INTRAVENOUS
  Filled 2016-01-27: qty 1.5

## 2016-01-27 MED ORDER — HEPARIN (PORCINE) IN NACL 2-0.9 UNIT/ML-% IJ SOLN
INTRAMUSCULAR | Status: DC | PRN
  Administered 2016-01-27: 1000 mL

## 2016-01-27 MED ORDER — SODIUM CHLORIDE 0.9 % IV SOLN
1250.0000 mg | INTRAVENOUS | Status: AC
  Administered 2016-01-28: 1250 mg via INTRAVENOUS
  Filled 2016-01-27: qty 1250

## 2016-01-27 MED ORDER — SODIUM CHLORIDE 0.9% FLUSH
3.0000 mL | INTRAVENOUS | Status: DC | PRN
Start: 2016-01-27 — End: 2016-01-28

## 2016-01-27 MED ORDER — HEPARIN BOLUS VIA INFUSION
4000.0000 [IU] | Freq: Once | INTRAVENOUS | Status: AC
  Administered 2016-01-27: 4000 [IU] via INTRAVENOUS
  Filled 2016-01-27: qty 4000

## 2016-01-27 MED ORDER — ASPIRIN EC 81 MG PO TBEC: 81.0000 mg | DELAYED_RELEASE_TABLET | Freq: Every day | ORAL | Status: DC

## 2016-01-27 MED ORDER — LIDOCAINE HCL (PF) 1 % IJ SOLN
INTRAMUSCULAR | Status: DC | PRN
  Administered 2016-01-27: 2 mL

## 2016-01-27 MED ORDER — SODIUM CHLORIDE 0.9 % WEIGHT BASED INFUSION: 3.0000 mL/kg/h | INTRAVENOUS | Status: AC

## 2016-01-27 MED ORDER — ADULT MULTIVITAMIN W/MINERALS CH
1.0000 | ORAL_TABLET | Freq: Every day | ORAL | Status: DC
  Administered 2016-01-27: 1 via ORAL
  Filled 2016-01-27: qty 1

## 2016-01-27 MED ORDER — METOPROLOL TARTRATE 12.5 MG HALF TABLET
12.5000 mg | ORAL_TABLET | Freq: Once | ORAL | Status: AC
  Administered 2016-01-28: 12.5 mg via ORAL
  Filled 2016-01-27: qty 1

## 2016-01-27 MED ORDER — METOPROLOL TARTRATE 5 MG/5ML IV SOLN: 5.0000 mg | Freq: Once | INTRAVENOUS | Status: DC

## 2016-01-27 MED ORDER — CALCIUM CARBONATE 1250 (500 CA) MG PO TABS
1250.0000 mg | ORAL_TABLET | Freq: Two times a day (BID) | ORAL | Status: DC
  Administered 2016-01-27 (×2): 1250 mg via ORAL
  Filled 2016-01-27 (×3): qty 1

## 2016-01-27 MED ORDER — INSULIN DETEMIR 100 UNIT/ML ~~LOC~~ SOLN: 30.0000 [IU] | Freq: Every day | SUBCUTANEOUS | Status: DC

## 2016-01-27 MED ORDER — SODIUM CHLORIDE 0.9 % WEIGHT BASED INFUSION: 1.0000 mL/kg/h | INTRAVENOUS | Status: DC

## 2016-01-27 MED ORDER — SODIUM CHLORIDE 0.9 % WEIGHT BASED INFUSION: 3.0000 mL/kg/h | INTRAVENOUS | Status: DC

## 2016-01-27 MED ORDER — FLUTICASONE PROPIONATE 50 MCG/ACT NA SUSP
2.0000 | Freq: Every day | NASAL | Status: DC | PRN
  Administered 2016-02-02 (×2): 2 via NASAL
  Filled 2016-01-27: qty 16

## 2016-01-27 MED ORDER — METOPROLOL TARTRATE 5 MG/5ML IV SOLN
INTRAVENOUS | Status: AC
  Administered 2016-01-27: 5 mg
  Filled 2016-01-27: qty 5

## 2016-01-27 MED ORDER — VITAMIN D 1000 UNITS PO TABS
1000.0000 [IU] | ORAL_TABLET | Freq: Every day | ORAL | Status: DC
  Administered 2016-01-27 – 2016-02-03 (×7): 1000 [IU] via ORAL
  Filled 2016-01-27 (×7): qty 1

## 2016-01-27 MED ORDER — INSULIN ASPART 100 UNIT/ML ~~LOC~~ SOLN
0.0000 [IU] | SUBCUTANEOUS | Status: DC
  Administered 2016-01-27: 2 [IU] via SUBCUTANEOUS
  Administered 2016-01-27: 3 [IU] via SUBCUTANEOUS
  Administered 2016-01-27: 2 [IU] via SUBCUTANEOUS
  Administered 2016-01-27: 3 [IU] via SUBCUTANEOUS
  Administered 2016-01-28 (×2): 2 [IU] via SUBCUTANEOUS
  Filled 2016-01-27: qty 1

## 2016-01-27 MED ORDER — DOPAMINE-DEXTROSE 3.2-5 MG/ML-% IV SOLN
0.0000 ug/kg/min | INTRAVENOUS | Status: DC
  Filled 2016-01-27: qty 250

## 2016-01-27 MED ORDER — SODIUM CHLORIDE 0.9 % IV SOLN
INTRAVENOUS | Status: DC | PRN
  Administered 2016-01-27: 20 mL/h via INTRAVENOUS

## 2016-01-27 MED ORDER — MORPHINE SULFATE (PF) 2 MG/ML IV SOLN
2.0000 mg | INTRAVENOUS | Status: DC | PRN
Start: 2016-01-27 — End: 2016-01-28

## 2016-01-27 MED ORDER — FENTANYL CITRATE (PF) 100 MCG/2ML IJ SOLN
INTRAMUSCULAR | Status: AC
  Filled 2016-01-27: qty 2

## 2016-01-27 MED ORDER — HEPARIN SODIUM (PORCINE) 1000 UNIT/ML IJ SOLN
INTRAMUSCULAR | Status: AC
  Filled 2016-01-27: qty 1

## 2016-01-27 MED ORDER — SODIUM CHLORIDE 0.9 % IV SOLN: 250.0000 mL | INTRAVENOUS | Status: DC | PRN

## 2016-01-27 MED ORDER — NITROGLYCERIN 0.4 MG SL SUBL: 0.4000 mg | SUBLINGUAL_TABLET | SUBLINGUAL | Status: DC | PRN

## 2016-01-27 MED ORDER — SODIUM CHLORIDE 0.9% FLUSH: 3.0000 mL | INTRAVENOUS | Status: DC | PRN

## 2016-01-27 MED ORDER — ONDANSETRON HCL 4 MG/2ML IJ SOLN: 4.0000 mg | Freq: Four times a day (QID) | INTRAMUSCULAR | Status: DC | PRN

## 2016-01-27 MED ORDER — HEPARIN SODIUM (PORCINE) 1000 UNIT/ML IJ SOLN
INTRAMUSCULAR | Status: DC | PRN
  Administered 2016-01-27: 3500 [IU] via INTRAVENOUS

## 2016-01-27 MED ORDER — NITROGLYCERIN IN D5W 200-5 MCG/ML-% IV SOLN
0.0000 ug/min | Freq: Once | INTRAVENOUS | Status: AC
  Administered 2016-01-27: 5 ug/min via INTRAVENOUS
  Filled 2016-01-27: qty 250

## 2016-01-27 MED ORDER — PANTOPRAZOLE SODIUM 40 MG PO TBEC
40.0000 mg | DELAYED_RELEASE_TABLET | Freq: Every day | ORAL | Status: DC
  Administered 2016-01-27: 40 mg via ORAL
  Filled 2016-01-27: qty 1

## 2016-01-27 MED ORDER — ASPIRIN 81 MG PO CHEW: 81.0000 mg | CHEWABLE_TABLET | Freq: Every day | ORAL | Status: DC

## 2016-01-27 MED ORDER — CHLORHEXIDINE GLUCONATE 4 % EX LIQD
CUTANEOUS | Status: AC
  Administered 2016-01-27: 23:00:00
  Filled 2016-01-27: qty 15

## 2016-01-27 MED ORDER — SODIUM CHLORIDE 0.9 % IV SOLN
INTRAVENOUS | Status: DC
  Filled 2016-01-27: qty 30

## 2016-01-27 MED ORDER — IOPAMIDOL (ISOVUE-370) INJECTION 76%
INTRAVENOUS | Status: DC | PRN
  Administered 2016-01-27: 100 mL via INTRA_ARTERIAL

## 2016-01-27 MED ORDER — MIDAZOLAM HCL 2 MG/2ML IJ SOLN
INTRAMUSCULAR | Status: AC
  Filled 2016-01-27: qty 2

## 2016-01-27 MED ORDER — SODIUM CHLORIDE 0.9 % IV SOLN
INTRAVENOUS | Status: DC
  Filled 2016-01-27: qty 40

## 2016-01-27 MED ORDER — ATORVASTATIN CALCIUM 10 MG PO TABS
10.0000 mg | ORAL_TABLET | Freq: Every day | ORAL | Status: DC
  Administered 2016-01-27 – 2016-01-30 (×3): 10 mg via ORAL
  Filled 2016-01-27 (×3): qty 1

## 2016-01-27 MED ORDER — VERAPAMIL HCL 2.5 MG/ML IV SOLN
INTRAVENOUS | Status: AC
  Filled 2016-01-27: qty 2

## 2016-01-27 MED ORDER — NITROGLYCERIN IN D5W 200-5 MCG/ML-% IV SOLN
20.0000 ug/min | INTRAVENOUS | Status: DC
  Administered 2016-01-27: 20 ug/min via INTRAVENOUS

## 2016-01-27 MED ORDER — ASPIRIN 81 MG PO CHEW: 81.0000 mg | CHEWABLE_TABLET | ORAL | Status: DC

## 2016-01-27 MED ORDER — INSULIN REGULAR HUMAN 100 UNIT/ML IJ SOLN
INTRAMUSCULAR | Status: DC
  Filled 2016-01-27: qty 2.5

## 2016-01-27 MED ORDER — DEXTROSE 5 % IV SOLN
0.0000 ug/min | INTRAVENOUS | Status: DC
  Filled 2016-01-27: qty 4

## 2016-01-27 MED ORDER — HEPARIN (PORCINE) IN NACL 2-0.9 UNIT/ML-% IJ SOLN
INTRAMUSCULAR | Status: AC
  Filled 2016-01-27: qty 1000

## 2016-01-27 MED ORDER — LIDOCAINE HCL (PF) 1 % IJ SOLN
INTRAMUSCULAR | Status: AC
  Filled 2016-01-27: qty 30

## 2016-01-27 MED ORDER — IOPAMIDOL (ISOVUE-370) INJECTION 76%
INTRAVENOUS | Status: AC
  Filled 2016-01-27: qty 50

## 2016-01-27 MED ORDER — VERAPAMIL HCL 2.5 MG/ML IV SOLN
INTRAVENOUS | Status: DC | PRN
  Administered 2016-01-27: 10 mL via INTRA_ARTERIAL

## 2016-01-27 MED ORDER — IOPAMIDOL (ISOVUE-370) INJECTION 76%
INTRAVENOUS | Status: AC
  Filled 2016-01-27: qty 100

## 2016-01-27 MED ORDER — OXYCODONE-ACETAMINOPHEN 5-325 MG PO TABS: 1.0000 | ORAL_TABLET | ORAL | Status: DC | PRN

## 2016-01-27 MED ORDER — SODIUM CHLORIDE 0.9% FLUSH: 3.0000 mL | Freq: Two times a day (BID) | INTRAVENOUS | Status: DC

## 2016-01-27 MED ORDER — DEXMEDETOMIDINE HCL IN NACL 400 MCG/100ML IV SOLN
0.1000 ug/kg/h | INTRAVENOUS | Status: DC
  Filled 2016-01-27: qty 100

## 2016-01-27 MED ORDER — MIDAZOLAM HCL 2 MG/2ML IJ SOLN
INTRAMUSCULAR | Status: DC | PRN
  Administered 2016-01-27: 1 mg via INTRAVENOUS

## 2016-01-27 MED ORDER — PHENYLEPHRINE HCL 10 MG/ML IJ SOLN
30.0000 ug/min | INTRAVENOUS | Status: DC
  Filled 2016-01-27: qty 2

## 2016-01-27 MED ORDER — POTASSIUM CHLORIDE 2 MEQ/ML IV SOLN
80.0000 meq | INTRAVENOUS | Status: DC
  Filled 2016-01-27: qty 40

## 2016-01-27 MED ORDER — FENTANYL CITRATE (PF) 100 MCG/2ML IJ SOLN
INTRAMUSCULAR | Status: DC | PRN
  Administered 2016-01-27: 50 ug via INTRAVENOUS

## 2016-01-27 MED ORDER — MAGNESIUM SULFATE 50 % IJ SOLN
40.0000 meq | INTRAMUSCULAR | Status: DC
  Filled 2016-01-27: qty 10

## 2016-01-27 MED ORDER — INSULIN DETEMIR 100 UNIT/ML ~~LOC~~ SOLN
25.0000 [IU] | Freq: Every day | SUBCUTANEOUS | Status: DC
  Administered 2016-01-27: 25 [IU] via SUBCUTANEOUS
  Filled 2016-01-27 (×3): qty 0.25

## 2016-01-27 MED ORDER — BIOTIN 10000 MCG PO TABS: 10000.0000 ug | ORAL_TABLET | ORAL | Status: DC

## 2016-01-27 MED ORDER — SODIUM CHLORIDE 0.9% FLUSH
3.0000 mL | Freq: Two times a day (BID) | INTRAVENOUS | Status: DC
  Administered 2016-01-27: 3 mL via INTRAVENOUS

## 2016-01-27 SURGICAL SUPPLY — 10 items
CATH INFINITI 5 FR JL3.5 (CATHETERS) ×2 IMPLANT
CATH INFINITI JR4 5F (CATHETERS) ×2 IMPLANT
DEVICE RAD COMP TR BAND LRG (VASCULAR PRODUCTS) ×2 IMPLANT
GLIDESHEATH SLEND A-KIT 6F 22G (SHEATH) ×2 IMPLANT
KIT ENCORE 26 ADVANTAGE (KITS) IMPLANT
KIT HEART LEFT (KITS) ×2 IMPLANT
PACK CARDIAC CATHETERIZATION (CUSTOM PROCEDURE TRAY) ×2 IMPLANT
TRANSDUCER W/STOPCOCK (MISCELLANEOUS) ×2 IMPLANT
TUBING CIL FLEX 10 FLL-RA (TUBING) ×2 IMPLANT
WIRE SAFE-T 1.5MM-J .035X260CM (WIRE) ×2 IMPLANT

## 2016-01-27 NOTE — ED Notes (Signed)
Attempted report x1. 

## 2016-01-27 NOTE — Progress Notes (Signed)
CARDIAC REHAB PHASE I  Pt with critical disease per cath report, for CABG tomorrow, will hold ambulation for today. Cardiac surgery pre-op education completed with pt and family at bedside. Reviewed sternal precautions, IS, activity progression, cardiac surgery booklet and cardiac surgery guidelines. Pt verbalized understanding. Pt in bed, call bell within reach. Will follow post-op.   Lenna Sciara, RN, BSN 01/27/2016 2:43 PM

## 2016-01-27 NOTE — Progress Notes (Signed)
PROGRESS NOTE                                                                                                                                                                                                             Patient Demographics:    [name], is a 70 y.o. female, DOB - 1945/05/14, VKP:[id number]  Admit date - 01/27/2016   Admitting Physician Etta Quill, DO  Outpatient Primary MD for the patient is Jeanmarie Hubert, MD  LOS - 0  Chief Complaint  Patient presents with  . Chest Pain       Brief Narrative    [name] is a 70 y.o. female with medical history significant of DM2, HTN, HLD.  Patient presents to the ED with c/o chest pain.  Patient has been having exertional dyspnea, chest and bilateral arm pain for the past several months.  Worsening especially recently.  She becomes SOB with chest pain with even minimal activity at this point.  2 weeks ago she had a severe CP episode that resolved with ASA.  This evening she developed chest pressure at about 2145 while at rest.  Took ASA without relief, then finally took her husbands SL NTG with relief of chest pain.  At this point she realized that because the NTG worked there could be something wrong with her heart and she presents to the ED    Subjective:    [name] today has, No headache, No chest pain, No abdominal pain - No Nausea, No new weakness tingling or numbness, No Cough - SOB.    Assessment  & Plan :     1. ACS with NSTEMI - Cards following on ACS protocol, Left heart catheterization on 01/27/2016 shows multivessel disease, we'll defer management to cardiology likely patient will require CABG. Currently pain-free on ACS protocol. Continue aspirin, heparin and nitroglycerin drip for now.  2. Dyslipidemia. On statin.  3. Hypertension. On beta blocker.  4. GERD. On PPI.  5. DM type II. On Levemir and sliding scale  combination.  CBG (last 3)   Recent Labs  01/27/16 0511 01/27/16 0734 01/27/16 1107  GLUCAP 243* 206* 189*      Family Communication  :   Husband  Code Status :  Full  Diet : Heart Healthy  Disposition Plan  :  [facility] inpt  Consults  :  Cards, CTVS  Procedures  :    L heart cath   Acute coronary syndrome with recent total occlusion of the dominant RCA, high-grade obstruction in the proximal LAD which Collateralizes the RCA, and high-grade obstruction and a moderate size ramus intermedius branch.  Left ventricular systolic dysfunction with inferobasal moderate hypokinesis and EF of 45-50%. Upper normal LV EDP.   RECOMMENDATIONS:  Long discussion with the patient concerning treatment options which include multivessel PCI / stenting vs coronary bypass grafting with LIMA to the LAD. If PCI, RCA will require near full metal jacket. After discussion, the patient would like the approach that will give her the most durable long-term success.  IV nitroglycerin, IV fluid, high intensity statin therapy, aspirin therapy, and beta blocker therapy as tolerated.  Expedited CABG because of presentation and critical nature of disease.   DVT Prophylaxis  :   Heparin   Lab Results  Component Value Date   PLT 156 01/26/2016    Inpatient Medications  Scheduled Meds: . [START ON 01/28/2016] aspirin  81 mg Oral Daily  . atorvastatin  10 mg Oral q1800  . calcium carbonate  1,250 mg Oral BID WC  . cholecalciferol  1,000 Units Oral Daily  . insulin aspart  0-9 Units Subcutaneous Q4H  . insulin detemir  25 Units Subcutaneous QHS  . metoprolol      . metoprolol  5 mg Intravenous Once  . multivitamin with minerals  1 tablet Oral Daily  . pantoprazole  40 mg Oral Daily  . sodium chloride flush  3 mL Intravenous Q12H   Continuous Infusions: . sodium chloride    . heparin    . nitroGLYCERIN     PRN Meds:.sodium chloride, acetaminophen, fluticasone, morphine injection,  nitroGLYCERIN, ondansetron (ZOFRAN) IV, oxyCODONE-acetaminophen, sodium chloride flush  Antibiotics  :    Anti-infectives    None         Objective:   Vitals:   01/27/16 0922 01/27/16 0927 01/27/16 0932 01/27/16 0937  BP: 108/61 (!) 110/55    Pulse: 84 89 90 (!) 0  Resp: 18 12    Temp:      TempSrc:      SpO2: 93% 95%    Weight:      Height:        Wt Readings from Last 3 Encounters:  01/27/16 70.5 kg (155 lb 8 oz)  12/28/15 71.1 kg (156 lb 12.8 oz)  12/15/15 70.3 kg (155 lb)     Intake/Output Summary (Last 24 hours) at 01/27/16 1058 Last data filed at 01/27/16 0900  Gross per 24 hour  Intake                0 ml  Output                0 ml  Net                0 ml     Physical Exam  Awake Alert, Oriented X 3, No new F.N deficits, Normal affect [place].AT,PERRAL Supple Neck,No JVD, No cervical lymphadenopathy appriciated.  Symmetrical Chest wall movement, Good air movement bilaterally, CTAB RRR,No Gallops,Rubs or new Murmurs, No Parasternal Heave +ve B.Sounds, Abd Soft, No tenderness, No organomegaly appriciated, No rebound - guarding or rigidity. No Cyanosis, Clubbing or edema, No new Rash or bruise      Data Review:    CBC  Recent Labs Lab 01/26/16 2349  WBC 5.6  HGB 11.1*  HCT  35.3*  PLT 156  MCV 84.0  MCH 26.4  MCHC 31.4  RDW 15.5    Chemistries   Recent Labs Lab 01/26/16 2349  NA 135  K 3.8  CL 99*  CO2 20*  GLUCOSE 429*  BUN 11  CREATININE 0.82  CALCIUM 9.7   ------------------------------------------------------------------------------------------------------------------ No results for input(s): CHOL, HDL, LDLCALC, TRIG, CHOLHDL, LDLDIRECT in the last 72 hours.  Lab Results  Component Value Date   HGBA1C 9.7 (H) 12/11/2015   ------------------------------------------------------------------------------------------------------------------ No results for input(s): TSH, T4TOTAL, T3FREE, THYROIDAB in the last 72 hours.  Invalid  input(s): FREET3 ------------------------------------------------------------------------------------------------------------------ No results for input(s): VITAMINB12, FOLATE, FERRITIN, TIBC, IRON, RETICCTPCT in the last 72 hours.  Coagulation profile No results for input(s): INR, PROTIME in the last 168 hours.  No results for input(s): DDIMER in the last 72 hours.  Cardiac Enzymes  Recent Labs Lab 01/27/16 0751  TROPONINI 0.61*   ------------------------------------------------------------------------------------------------------------------ No results found for: BNP  Micro Results Recent Results (from the past 240 hour(s))  MRSA PCR Screening     Status: None   Collection Time: 01/27/16  7:06 AM  Result Value Ref Range Status   MRSA by PCR NEGATIVE NEGATIVE Final    Comment:        The GeneXpert MRSA Assay (FDA approved for NASAL specimens only), is one component of a comprehensive MRSA colonization surveillance program. It is not intended to diagnose MRSA infection nor to guide or monitor treatment for MRSA infections.     Radiology Reports Dg Chest 2 View  Result Date: 01/27/2016 CLINICAL DATA:  Generalized chest pressure since 20/2 100 hours tonight. Bilateral arm pain. History of diabetes. EXAM: CHEST  2 VIEW COMPARISON:  06/02/2015 FINDINGS: Mild hyperinflation. Mild diffuse interstitial changes in the lungs with peribronchial thickening suggesting chronic bronchitis. No focal airspace disease or consolidation in the lungs. No blunting of costophrenic angles. No pneumothorax. Normal heart size and pulmonary vascularity. Calcification of the aorta. Degenerative changes in the spine and shoulders IMPRESSION: Chronic bronchitic changes. No evidence of active pulmonary disease. Electronically Signed   By: Lucienne Capers M.D.   On: 01/27/2016 00:19    Time Spent in minutes  30   Dvonte Gatliff K M.D on 01/27/2016 at 10:58 AM  Between 7am to 7pm - Pager -  [phone]  After 7pm go to [email or web address] - password [facility]  [facility] -  Office  [phone]

## 2016-01-27 NOTE — ED Provider Notes (Addendum)
[facility] DEPT Provider Note   CSN: [id number] Arrival date & time: 01/26/16  2338 By signing my name below, I, Evelene Croon, attest that this documentation has been prepared under the direction and in the presence of Orpah Greek, MD . Electronically Signed: Evelene Croon, Scribe. 01/27/2016. 2:39 AM.   History   Chief Complaint Chief Complaint  Patient presents with  . Chest Pain    The history is provided by the patient. No language interpreter was used.     HPI Comments:  ALAYNE ESTRELLA is a 70 y.o. female with a history of DM, HLD, and HTN, who presents to the Emergency Department complaining of chest pressure which began ~ 2145 while at rest. She reports associated generalized weakness and pain in her BUE. Pt reports h/o similar pain 2 weeks ago that resolved after taking ASA. She took ASA ~ 2200 this evening which resolved her pain, however pain returned ~2300. She then took one of her husbands NTG which resolved her pain. Pt rates her pain a 0/10 at this time. She denies cardiac hx. She has a FHx of CAD, her mother. She denies SOB, nausea, and diaphoresis.   Past Medical History:  Diagnosis Date  . Allergy   . Arthritis    neck  . Cataract    bilateral - MD monitoring cataracts  . Chronic kidney disease, stage I    DR OTTELIN  HX UTIS  . Cramp of limb   . Diabetes mellitus   . Dysphagia, unspecified(787.20)   . Dysuria   . Epistaxis   . GERD (gastroesophageal reflux disease)   . Heart murmur    NO CARDIOLOGIST  DX FOR YEARS ASYMPTOMATIC  . Lumbago   . Neoplasm of uncertain behavior of skin   . Nonspecific elevation of levels of transaminase or lactic acid dehydrogenase (LDH)   . Osteoarthrosis, unspecified whether generalized or localized, unspecified site   . Other and unspecified hyperlipidemia    diet controlled  . Pain in joint, shoulder region   . Paresthesias 04/01/2015  . Postablative ovarian failure   . Trochanteric bursitis of left hip  12/15/2015  . Type 2 diabetes mellitus without complication ([place])   . Unspecified essential hypertension    no meds    Patient Active Problem List   Diagnosis Date Noted  . NSTEMI (non-ST elevated myocardial infarction) ([place]) 01/27/2016  . DM (diabetes mellitus), type 2, uncontrolled ([place]) 12/15/2015  . Trochanteric bursitis of left hip 12/15/2015  . S/P lumbar spinal fusion 06/10/2015  . Paresthesias 04/01/2015  . Osteoporosis 03/31/2015  . Hearing loss 06/25/2014  . Itch 06/25/2014  . Insomnia 06/25/2014  . Hair thinning 06/25/2014  . Muscle spasm 10/08/2013  . Biceps tendon rupture, proximal 11/24/2012  . Hyperlipemia   . GERD (gastroesophageal reflux disease)   . Diabetes mellitus type 2, controlled ([place])   . Essential hypertension     Past Surgical History:  Procedure Laterality Date  . COLONOSCOPY  2012   [name] - polyp  . MAXIMUM ACCESS (MAS)POSTERIOR LUMBAR INTERBODY FUSION (PLIF) 1 LEVEL Left 06/10/2015   Procedure: FOR MAXIMUM ACCESS (MAS) POSTERIOR LUMBAR INTERBODY FUSION (PLIF) LUMBAR THREE-FOUR EXTRAFORAMINAL MICRODISCECTOMY LUMBAR FIVE-SACRAL ONE LEFT;  Surgeon: Eustace Moore, MD;  Location: [facility];  Service: Neurosurgery;  Laterality: Left;  . TUBAL LIGATION  1982   Dr Connye Burkitt  . UPPER GASTROINTESTINAL ENDOSCOPY    . VAGINAL HYSTERECTOMY  1997   Dr Rande Lawman    OB History  No data available       Home Medications    Prior to Admission medications   Medication Sig Start Date End Date Taking? Authorizing Provider  AMBULATORY NON FORMULARY MEDICATION One Touch Ultra Blue Test Strips Sig: Use to test blood sugar three times daily to control blood sugar Dx: E11.9 09/29/14  Yes Estill Dooms, MD  Biotin 10000 MCG TABS Take 10,000 mcg by mouth every morning.   Yes Historical Provider, MD  calcium carbonate (OS-CAL) 600 MG TABS Take 600 mg by mouth 2 (two) times daily with a meal.     Yes Historical Provider, MD  cholecalciferol (VITAMIN D) 1000 UNITS tablet  Take 1,000 Units by mouth daily.     Yes Historical Provider, MD  ciprofloxacin (CIPRO) 250 MG tablet Take 250 mg by mouth 2 (two) times daily as needed (to prevent UTIs). Take one tablet after intercourse to prevent urinary tract infection   Yes Historical Provider, MD  Cyanocobalamin (VITAMIN B 12 PO) Take 1,000 mcg by mouth daily.     Yes Historical Provider, MD  fish oil-omega-3 fatty acids 1000 MG capsule Take 1 g by mouth daily.     Yes Historical Provider, MD  fluticasone (FLONASE) 50 MCG/ACT nasal spray Place 2 sprays into both nostrils 2 (two) times daily. Decrease to 2 sprays/nostril daily after 5 days Patient taking differently: Place 2 sprays into both nostrils daily as needed for allergies.  12/10/13  Yes Liam Graham, PA-C  Glucosamine 500 MG CAPS Take 2,000 mg by mouth 2 (two) times daily.   Yes Historical Provider, MD  glucose blood test strip Use as instructed to test blood sugar three times daily  Dx  250.00 09/04/13  Yes Estill Dooms, MD  insulin detemir (LEVEMIR) 100 UNIT/ML injection Inject 45 Units into the skin at bedtime.    Yes Historical Provider, MD  Insulin Syringe-Needle U-100 (INSULIN SYRINGE .5CC/31GX5/16") 31G X 5/16" 0.5 ML MISC Use daily with the administration of insulin E11.9 03/31/15  Yes Estill Dooms, MD  MAGNESIUM PO Take 500 mg by mouth 2 (two) times daily in the am and at bedtime..   Yes Historical Provider, MD  meloxicam (MOBIC) 15 MG tablet One daily to help arthritis and bursitis Patient taking differently: Take 7.5 mg by mouth 2 (two) times daily. One daily to help arthritis and bursitis 12/15/15  Yes Estill Dooms, MD  metFORMIN (GLUCOPHAGE) 1000 MG tablet Take one tablet by mouth twice daily to treat diabetes 05/05/15  Yes Estill Dooms, MD  Multiple Vitamins-Minerals (MULTIVITAMIN WITH MINERALS) tablet Take 1 tablet by mouth daily.     Yes Historical Provider, MD  omeprazole (PRILOSEC) 20 MG capsule TAKE ONE CAPSULE BY MOUTH DAILY 12/21/15  Yes Estill Dooms, MD  Polyethyl Glycol-Propyl Glycol (SYSTANE OP) Place 1 drop into both eyes 2 (two) times daily.   Yes Historical Provider, MD  Probiotic Product (PROBIOTIC DAILY PO) Take 1 capsule by mouth daily. Digestive Advantage Probiotic   Yes Historical Provider, MD  Turmeric 500 MG CAPS Take 2 capsules by mouth 2 (two) times daily.   Yes Historical Provider, MD    Family History Family History  Problem Relation Age of Onset  . Cancer Father   . Arthritis Sister   . Arthritis Brother   . Heart disease Maternal Grandmother   . Heart disease Maternal Grandfather   . Heart disease Paternal Grandmother   . Heart disease Paternal Grandfather   . Liver cancer  Brother   . Colon cancer Neg Hx   . Esophageal cancer Neg Hx   . Rectal cancer Neg Hx   . Stomach cancer Neg Hx     Social History Social History  Substance Use Topics  . Smoking status: Never Smoker  . Smokeless tobacco: Never Used  . Alcohol use No     Allergies   Kiwi extract; Tdap [diphth-acell pertussis-tetanus]; Tramadol; and Latex   Review of Systems Review of Systems  Constitutional: Negative for diaphoresis.  Respiratory: Negative for shortness of breath.   Cardiovascular: Positive for chest pain.  Gastrointestinal: Negative for nausea.  Neurological: Positive for weakness (generalized).  All other systems reviewed and are negative.  Physical Exam Updated Vital Signs BP 148/84   Pulse 96   Resp 17   SpO2 96%   Physical Exam  Constitutional: She is oriented to person, place, and time. She appears well-developed and well-nourished. No distress.  HENT:  Head: Normocephalic and atraumatic.  Right Ear: Hearing normal.  Left Ear: Hearing normal.  Nose: Nose normal.  Mouth/Throat: Oropharynx is clear and moist and mucous membranes are normal.  Eyes: Conjunctivae and EOM are normal. Pupils are equal, round, and reactive to light.  Neck: Normal range of motion. Neck supple.  Cardiovascular: Regular rhythm,  S1 normal and S2 normal.  Exam reveals no gallop and no friction rub.   No murmur heard. Pulmonary/Chest: Effort normal and breath sounds normal. No respiratory distress. She exhibits no tenderness.  Abdominal: Soft. Normal appearance and bowel sounds are normal. There is no hepatosplenomegaly. There is no tenderness. There is no rebound, no guarding, no tenderness at McBurney's point and negative Murphy's sign. No hernia.  Musculoskeletal: Normal range of motion.  Neurological: She is alert and oriented to person, place, and time. She has normal strength. No cranial nerve deficit or sensory deficit. Coordination normal. GCS eye subscore is 4. GCS verbal subscore is 5. GCS motor subscore is 6.  Skin: Skin is warm, dry and intact. No rash noted. No cyanosis.  Psychiatric: She has a normal mood and affect. Her speech is normal and behavior is normal. Thought content normal.  Nursing note and vitals reviewed.    ED Treatments / Results  DIAGNOSTIC STUDIES:  Oxygen Saturation is 98% on RA, normal by my interpretation.    COORDINATION OF CARE:  2:12 AM Discussed treatment plan with pt at bedside and pt agreed to plan.  Labs (all labs ordered are listed, but only abnormal results are displayed) Labs Reviewed  BASIC METABOLIC PANEL - Abnormal; Notable for the following:       Result Value   Chloride 99 (*)    CO2 20 (*)    Glucose, Bld 429 (*)    Anion gap 16 (*)    All other components within normal limits  CBC - Abnormal; Notable for the following:    Hemoglobin 11.1 (*)    HCT 35.3 (*)    All other components within normal limits  I-STAT TROPOININ, ED - Abnormal; Notable for the following:    Troponin i, poc 0.14 (*)    All other components within normal limits  CBG MONITORING, ED - Abnormal; Notable for the following:    Glucose-Capillary 243 (*)    All other components within normal limits  TROPONIN I  TROPONIN I  TROPONIN I  HEPARIN LEVEL (UNFRACTIONATED)  HEPARIN LEVEL  (UNFRACTIONATED)  I-STAT TROPOININ, ED    EKG  EKG Interpretation  Date/Time:  Tuesday January 26 2016  23:42:26 EDT Ventricular Rate:  99 PR Interval:  172 QRS Duration: 102 QT Interval:  360 QTC Calculation: 462 R Axis:   -17 Text Interpretation:  Normal sinus rhythm Marked ST abnormality, possible inferior subendocardial injury Abnormal ECG Confirmed by Kaj Vasil  MD, Anh Bigos [id number]) on 01/27/2016 1:49:07 AM       Radiology Dg Chest 2 View  Result Date: 01/27/2016 CLINICAL DATA:  Generalized chest pressure since 20/2 100 hours tonight. Bilateral arm pain. History of diabetes. EXAM: CHEST  2 VIEW COMPARISON:  06/02/2015 FINDINGS: Mild hyperinflation. Mild diffuse interstitial changes in the lungs with peribronchial thickening suggesting chronic bronchitis. No focal airspace disease or consolidation in the lungs. No blunting of costophrenic angles. No pneumothorax. Normal heart size and pulmonary vascularity. Calcification of the aorta. Degenerative changes in the spine and shoulders IMPRESSION: Chronic bronchitic changes. No evidence of active pulmonary disease. Electronically Signed   By: Lucienne Capers M.D.   On: 01/27/2016 00:19    Procedures Procedures (including critical care time)  Medications Ordered in ED Medications  calcium carbonate (OS-CAL) tablet 600 mg (not administered)  cholecalciferol (VITAMIN D) tablet 1,000 Units (not administered)  fluticasone (FLONASE) 50 MCG/ACT nasal spray 2 spray (not administered)  pantoprazole (PROTONIX) EC tablet 40 mg (not administered)  multivitamin with minerals tablet 1 tablet (not administered)  insulin aspart (novoLOG) injection 0-9 Units (not administered)  heparin ADULT infusion 100 units/mL (25000 units/228m sodium chloride 0.45%) (800 Units/hr Intravenous New Bag/Given 01/27/16 0506)  aspirin EC tablet 81 mg (not administered)  nitroGLYCERIN (NITROSTAT) SL tablet 0.4 mg (not administered)  acetaminophen (TYLENOL)  tablet 650 mg (not administered)  ondansetron (ZOFRAN) injection 4 mg (not administered)  atorvastatin (LIPITOR) tablet 10 mg (not administered)  insulin detemir (LEVEMIR) injection 25 Units (not administered)  nitroGLYCERIN 50 mg in dextrose 5 % 250 mL (0.2 mg/mL) infusion (5 mcg/min Intravenous New Bag/Given 01/27/16 0500)  heparin bolus via infusion 4,000 Units (4,000 Units Intravenous Bolus from Bag 01/27/16 0507)  aspirin chewable tablet 324 mg (324 mg Oral Given 01/27/16 0500)    Or  aspirin suppository 300 mg ( Rectal See Alternative 01/27/16 0500)     Initial Impression / Assessment and Plan / ED Course  I have reviewed the triage vital signs and the nursing notes.  Pertinent labs & imaging results that were available during my care of the patient were reviewed by me and considered in my medical decision making (see chart for details).  Clinical Course   Patient presents with complaints of chest pain. Patient reports onset of pain at rest. She initially had pain in both of her arms and then substernal chest pain. She'll continue aspirin and the pain subsided and then came back. She took one of her husband's nitroglycerin tablets and pain subsided again. Patient does not have any known coronary artery disease, but does have significant cardiac risk factors. She also reports that she had back surgery in February. As part of her recovery she was told to exercise, but noticed that she walked approximately a quarter of a mile she would get severe pain in her arms causing her to stop. History is concerning for cardiac etiology of her pain. Her EKG also shows ST depressions in inferior and lateral leads, not seen on previous EKGs. Patient is currently pain-free. Heart score is 8.  Presentation, history, EKG and lab work discussed with Dr. HKoleen Nimrod cardiology. He felt that the patient would not necessarily require heart catheterization, and as she is pain-free, does not feel  she requires  hospitalization on the cardiology service. Recommends hospitalist admission. Did not recommend initiating heparin unless troponin elevates.  HEART Score for Major Cardiac Events from [email or web address]  on 01/27/2016 ** All calculations should be rechecked by clinician prior to use **  RESULT SUMMARY: 8 points High Score (7-10 points)  Risk of MACE of 50-65%.   INPUTS: History -> 2 = Highly suspicious EKG -> 2 = Significant ST depression Age -> 2 = >65 Risk factors -> 2 = =3 risk factors or history of atherosclerotic disease Initial troponin -> 0 = =normal limit  Addendum: Second troponin has returned positive. She is now experiencing chest pain again as well. Patient will be initiated on IV nitroglycerin and heparin.  CRITICAL CARE Performed by: Orpah Greek   Total critical care time: 30 minutes  Critical care time was exclusive of separately billable procedures and treating other patients.  Critical care was necessary to treat or prevent imminent or life-threatening deterioration.  Critical care was time spent personally by me on the following activities: development of treatment plan with patient and/or surrogate as well as nursing, discussions with consultants, evaluation of patient's response to treatment, examination of patient, obtaining history from patient or surrogate, ordering and performing treatments and interventions, ordering and review of laboratory studies, ordering and review of radiographic studies, pulse oximetry and re-evaluation of patient's condition.   Final Clinical Impressions(s) / ED Diagnoses   Final diagnoses:  Acute coronary syndrome [facility])    New Prescriptions New Prescriptions   No medications on file   I personally performed the services described in this documentation, which was scribed in my presence. The recorded information has been reviewed and is accurate.     Orpah Greek, MD 01/27/16 4536    Orpah Greek,  MD 01/27/16 [phone]

## 2016-01-27 NOTE — Consult Note (Signed)
Cardiology Consult    Patient ID: [name] MRN: [id number], DOB/AGE: 12-05-1945   Admit date: 01/27/2016 Date of Consult: 01/27/2016  Primary Physician: Jeanmarie Hubert, MD Reason for Consult:  Chest pain Primary Cardiologist: None Requesting Provider: Hospitalist   History of Present Illness    [name] is a 70 year old female with a significant past medical history of hypertension, hyperlipidemia, gastroesophageal reflux disease, and insulin dependent diabetes who is here for chest pain.  Patient reports at around 9:45 pm yesterday night, she started having chest pain while laying in her recliner.  The chest pain was located in the center of her chest and went down both of her arms.  She reports the chest pain as "achy", initially 5/10 in intensity, and eventually relieved some with medications given in the emergency department.  Other associated symptoms with her initial chest pain was dizziness that has now resolved.  She denies other symptoms such as palpitations, fainting, feeling like she is going to faint, nausea, or sweating.  Patient does report an episode of similar chest pain last week for which she took aspirin and lasted less than 1-hour.  The pain occurred while getting ready for church.  She does state that she has pain in both of her arms after walking for a distance since having her back surgery, and the pain usually resolves with rest.  At this time she is reporting 1/10 intensity of chest pain.  She does report that her blood pressure has been elevated but was never placed on any medications.    Past Medical History   Past Medical History:  Diagnosis Date  . Allergy   . Arthritis    neck  . Cataract    bilateral - MD monitoring cataracts  . Chronic kidney disease, stage I    DR OTTELIN  HX UTIS  . Cramp of limb   . Diabetes mellitus   . Dysphagia, unspecified(787.20)   . Dysuria   . Epistaxis   . GERD (gastroesophageal reflux disease)   . Heart  murmur    NO CARDIOLOGIST  DX FOR YEARS ASYMPTOMATIC  . Lumbago   . Neoplasm of uncertain behavior of skin   . Nonspecific elevation of levels of transaminase or lactic acid dehydrogenase (LDH)   . Osteoarthrosis, unspecified whether generalized or localized, unspecified site   . Other and unspecified hyperlipidemia    diet controlled  . Pain in joint, shoulder region   . Paresthesias 04/01/2015  . Postablative ovarian failure   . Trochanteric bursitis of left hip 12/15/2015  . Type 2 diabetes mellitus without complication ([place])   . Unspecified essential hypertension    no meds    Past Surgical History:  Procedure Laterality Date  . COLONOSCOPY  2012   [name] - polyp  . MAXIMUM ACCESS (MAS)POSTERIOR LUMBAR INTERBODY FUSION (PLIF) 1 LEVEL Left 06/10/2015   Procedure: FOR MAXIMUM ACCESS (MAS) POSTERIOR LUMBAR INTERBODY FUSION (PLIF) LUMBAR THREE-FOUR EXTRAFORAMINAL MICRODISCECTOMY LUMBAR FIVE-SACRAL ONE LEFT;  Surgeon: Eustace Moore, MD;  Location: [place] [facility];  Service: Neurosurgery;  Laterality: Left;  . TUBAL LIGATION  1982   Dr Connye Burkitt  . UPPER GASTROINTESTINAL ENDOSCOPY    . VAGINAL HYSTERECTOMY  1997   Dr Rande Lawman     Allergies  Allergies  Allergen Reactions  . Kiwi Extract Anaphylaxis  . Tdap [Diphth-Acell Pertussis-Tetanus] Swelling    Swelling at injection site, gets very hot  . Tramadol Nausea And Vomiting  . Latex Itching, Dermatitis and  Rash    Inpatient Medications    . aspirin  324 mg Oral NOW   Or  . aspirin  300 mg Rectal NOW  . [START ON 01/28/2016] aspirin EC  81 mg Oral Daily  . atorvastatin  10 mg Oral q1800  . calcium carbonate  600 mg Oral BID WC  . cholecalciferol  1,000 Units Oral Daily  . heparin  4,000 Units Intravenous Once  . insulin aspart  0-9 Units Subcutaneous Q4H  . insulin detemir  25 Units Subcutaneous QHS  . multivitamin with minerals  1 tablet Oral Daily  . nitroGLYCERIN  0-200 mcg/min Intravenous Once  . pantoprazole  40 mg Oral Daily      Family History    Family History  Problem Relation Age of Onset  . Cancer Father   . Arthritis Sister   . Arthritis Brother   . Heart disease Maternal Grandmother   . Heart disease Maternal Grandfather   . Heart disease Paternal Grandmother   . Heart disease Paternal Grandfather   . Liver cancer Brother   . Colon cancer Neg Hx   . Esophageal cancer Neg Hx   . Rectal cancer Neg Hx   . Stomach cancer Neg Hx     Social History    Social History   Social History  . Marital status: Married    Spouse name: N/A  . Number of children: N/A  . Years of education: N/A   Occupational History  . Not on file.   Social History Main Topics  . Smoking status: Never Smoker  . Smokeless tobacco: Never Used  . Alcohol use No  . Drug use: No  . Sexual activity: Yes    Partners: Male    Birth control/ protection: Post-menopausal, Surgical     Comment: Hysterectomy   Other Topics Concern  . Not on file   Social History Narrative  . No narrative on file     Review of Systems   All other systems reviewed and are otherwise negative except as noted above.  Physical Exam    Blood pressure 141/75, pulse 90, resp. rate 21, SpO2 95 %.  General: Pleasant, NAD Psych: Normal affect. Neuro: Alert and oriented X 3. Moves all extremities spontaneously. HEENT: Normal  Neck: Supple without bruits or JVD. Lungs:  Resp regular and unlabored, CTA. Heart: RRR no s3, s4, or murmurs. Abdomen: Soft, non-tender, non-distended, BS + x 4.  Extremities: No clubbing, cyanosis or edema. DP/PT/Radials 2+ and equal bilaterally.  Labs    Troponin [facility] Test)  Recent Labs  01/27/16 0421  TROPIPOC 0.14*   No results for input(s): CKTOTAL, CKMB, TROPONINI in the last 72 hours. Lab Results  Component Value Date   WBC 5.6 01/26/2016   HGB 11.1 (L) 01/26/2016   HCT 35.3 (L) 01/26/2016   MCV 84.0 01/26/2016   PLT 156 01/26/2016    Recent Labs Lab 01/26/16 2349  NA 135  K 3.8   CL 99*  CO2 20*  BUN 11  CREATININE 0.82  CALCIUM 9.7  GLUCOSE 429*   Lab Results  Component Value Date   CHOL 198 12/11/2015   HDL 78 12/11/2015   LDLCALC 106 12/11/2015   TRIG 72 12/11/2015   Lab Results  Component Value Date   DDIMER 1.42 (H) 05/09/2015     Radiology Studies    Dg Chest 2 View  Result Date: 01/27/2016 CLINICAL DATA:  Generalized chest pressure since 20/2 100 hours tonight. Bilateral arm  pain. History of diabetes. EXAM: CHEST  2 VIEW COMPARISON:  06/02/2015 FINDINGS: Mild hyperinflation. Mild diffuse interstitial changes in the lungs with peribronchial thickening suggesting chronic bronchitis. No focal airspace disease or consolidation in the lungs. No blunting of costophrenic angles. No pneumothorax. Normal heart size and pulmonary vascularity. Calcification of the aorta. Degenerative changes in the spine and shoulders IMPRESSION: Chronic bronchitic changes. No evidence of active pulmonary disease. Electronically Signed   By: Lucienne Capers M.D.   On: 01/27/2016 00:19    EKG & Cardiac Imaging    EKG: 01/27/16 (23:42):  NSR, poor r-wave progression, inferior ST-depressions  Echocardiogram:   Assessment & Plan    # Chest pain Patient with chest pain in the setting of an elevated troponin level.  Patient likely suffering from NSTEMI-ACS.  At this time she is reporting 1/10 intensity of chest pain.  Initial ECG shows signs of myocardial ischemia and troponin is now elevated.  Patient has received full dose aspirin and was started on heparin drip.   - Will discuss with team potential left heart catheterization today, so please make NPO. - Please arrange for an echocardiogram. - Continue Heparin drip - Continue aspirin 81 mg/daily and start atorvastatin 80 mg/daily. - Please check lipid panel. - Agree with nitro drip.   # Hypertension Patient with known essential hypertension.  Blood pressure appears to be above goal at this time in the setting of likely  coronary artery disease.  She is without any overt end-organ damage.   - Start metoprolol tartrate 25 mg/daily for now.    Signed, Jon Billings, MD 01/27/2016, 4:57 AM

## 2016-01-27 NOTE — Progress Notes (Signed)
Pre-op Cardiac Surgery  Carotid Findings:  Findings suggest 1-39% internal carotid artery stenosis bilaterally. Vertebral arteries are patent with antegrade flow.  Upper Extremity Right Left  Brachial Pressures 132-Triphasic 139-Triphasic  Radial Waveforms Triphasic Triphasic  Ulnar Waveforms Triphasic Triphasic  Palmar Arch (Allen's Test) Signal obliterates with both radial and ulnar compression. Signal is unaffected with radial compression, obliterates with ulnar compression.    Lower  Extremity Right Left  Dorsalis Pedis 177-Triphasic 173-Triphasic  Anterior Tibial    Posterior Tibial 150-Triphasic 171-Triphasic  Ankle/Brachial Indices 1.27 1.24    Findings:   ABIs are within normal limits bilaterally.  01/27/2016 4:08 PM Maudry Mayhew, BS, RVT, RDCS, RDMS

## 2016-01-27 NOTE — H&P (View-Only) (Signed)
    Subjective:  Still complaining of some SSCP.  Admitted around midnight Nitro increased. Having more PVC;s   Objective:  Vitals:   01/27/16 0500 01/27/16 0600 01/27/16 0700 01/27/16 0702  BP: 148/84 134/64  134/72  Pulse: 96 98  93  Resp: 17 23  20   Temp:    98.8 F (37.1 C)  TempSrc:    Oral  SpO2: 96% 96%  97%  Weight:   70.5 kg (155 lb 8 oz)   Height:   5' 2"  (1.575 m)     Intake/Output from previous day: No intake or output data in the 24 hours ending 01/27/16 0809  Physical Exam: Affect appropriate Healthy:  appears stated age 22: normal Neck supple with no adenopathy JVP normal no bruits no thyromegaly Lungs clear with no wheezing and good diaphragmatic motion Heart:  S1/S2 MR  murmur, no rub, gallop or click PMI normal Abdomen: benighn, BS positve, no tenderness, no AAA no bruit.  No HSM or HJR Distal pulses intact with no bruits No edema Neuro non-focal Skin warm and dry No muscular weakness   Lab Results: Basic Metabolic Panel:  Recent Labs  01/26/16 2349  NA 135  K 3.8  CL 99*  CO2 20*  GLUCOSE 429*  BUN 11  CREATININE 0.82  CALCIUM 9.7   CBC:  Recent Labs  01/26/16 2349  WBC 5.6  HGB 11.1*  HCT 35.3*  MCV 84.0  PLT 156    Imaging: Dg Chest 2 View  Result Date: 01/27/2016 CLINICAL DATA:  Generalized chest pressure since 20/2 100 hours tonight. Bilateral arm pain. History of diabetes. EXAM: CHEST  2 VIEW COMPARISON:  06/02/2015 FINDINGS: Mild hyperinflation. Mild diffuse interstitial changes in the lungs with peribronchial thickening suggesting chronic bronchitis. No focal airspace disease or consolidation in the lungs. No blunting of costophrenic angles. No pneumothorax. Normal heart size and pulmonary vascularity. Calcification of the aorta. Degenerative changes in the spine and shoulders IMPRESSION: Chronic bronchitic changes. No evidence of active pulmonary disease. Electronically Signed   By: Lucienne Capers M.D.   On:  01/27/2016 00:19    Cardiac Studies:  ECG:  SR inferolateral ST depression   Telemetry:  NSR PVCs;   Echo:   Medications:   . atorvastatin  10 mg Oral q1800  . calcium carbonate  1,250 mg Oral BID WC  . cholecalciferol  1,000 Units Oral Daily  . insulin aspart  0-9 Units Subcutaneous Q4H  . insulin detemir  25 Units Subcutaneous QHS  . multivitamin with minerals  1 tablet Oral Daily  . pantoprazole  40 mg Oral Daily     . heparin 800 Units/hr (01/27/16 0506)    Assessment/Plan:  SEMI:  Worrisome still with pain and rather diffuse ST depression and positive istat have  Called lab to do emergently Continue iv heparin and nitro Will give 5 mg iv lopressor Discussed Cath with patient and more than 10 family members Risks including stroke MI, death, need for emergent CABG and bleeding Willing to proceed Orders done cath lab called  DM:  Took metformin yesterday cover with insulin IM to follow   Chol:  Will change to high dose statin post intervention   [name] 01/27/2016, 8:09 AM

## 2016-01-27 NOTE — Progress Notes (Addendum)
    Subjective:  Still complaining of some SSCP.  Admitted around midnight Nitro increased. Having more PVC;s   Objective:  Vitals:   01/27/16 0500 01/27/16 0600 01/27/16 0700 01/27/16 0702  BP: 148/84 134/64  134/72  Pulse: 96 98  93  Resp: 17 23  20   Temp:    98.8 F (37.1 C)  TempSrc:    Oral  SpO2: 96% 96%  97%  Weight:   70.5 kg (155 lb 8 oz)   Height:   5' 2"  (1.575 m)     Intake/Output from previous day: No intake or output data in the 24 hours ending 01/27/16 0809  Physical Exam: Affect appropriate Healthy:  appears stated age 70: normal Neck supple with no adenopathy JVP normal no bruits no thyromegaly Lungs clear with no wheezing and good diaphragmatic motion Heart:  S1/S2 MR  murmur, no rub, gallop or click PMI normal Abdomen: benighn, BS positve, no tenderness, no AAA no bruit.  No HSM or HJR Distal pulses intact with no bruits No edema Neuro non-focal Skin warm and dry No muscular weakness   Lab Results: Basic Metabolic Panel:  Recent Labs  01/26/16 2349  NA 135  K 3.8  CL 99*  CO2 20*  GLUCOSE 429*  BUN 11  CREATININE 0.82  CALCIUM 9.7   CBC:  Recent Labs  01/26/16 2349  WBC 5.6  HGB 11.1*  HCT 35.3*  MCV 84.0  PLT 156    Imaging: Dg Chest 2 View  Result Date: 01/27/2016 CLINICAL DATA:  Generalized chest pressure since 20/2 100 hours tonight. Bilateral arm pain. History of diabetes. EXAM: CHEST  2 VIEW COMPARISON:  06/02/2015 FINDINGS: Mild hyperinflation. Mild diffuse interstitial changes in the lungs with peribronchial thickening suggesting chronic bronchitis. No focal airspace disease or consolidation in the lungs. No blunting of costophrenic angles. No pneumothorax. Normal heart size and pulmonary vascularity. Calcification of the aorta. Degenerative changes in the spine and shoulders IMPRESSION: Chronic bronchitic changes. No evidence of active pulmonary disease. Electronically Signed   By: Lucienne Capers M.D.   On:  01/27/2016 00:19    Cardiac Studies:  ECG:  SR inferolateral ST depression   Telemetry:  NSR PVCs;   Echo:   Medications:   . atorvastatin  10 mg Oral q1800  . calcium carbonate  1,250 mg Oral BID WC  . cholecalciferol  1,000 Units Oral Daily  . insulin aspart  0-9 Units Subcutaneous Q4H  . insulin detemir  25 Units Subcutaneous QHS  . multivitamin with minerals  1 tablet Oral Daily  . pantoprazole  40 mg Oral Daily     . heparin 800 Units/hr (01/27/16 0506)    Assessment/Plan:  SEMI:  Worrisome still with pain and rather diffuse ST depression and positive istat have  Called lab to do emergently Continue iv heparin and nitro Will give 5 mg iv lopressor Discussed Cath with patient and more than 10 family members Risks including stroke MI, death, need for emergent CABG and bleeding Willing to proceed Orders done cath lab called  DM:  Took metformin yesterday cover with insulin IM to follow   Chol:  Will change to high dose statin post intervention   Critical care time spent with patient , family and arranging acute cath 45 minutes   [name] 01/27/2016, 8:09 AM

## 2016-01-27 NOTE — Consult Note (Signed)
Reason for Consult:3 vessel CAD, Non STEMI Referring Physician: Dr. Toula Moos is an 71 y.o. female.  HPI: 70 yo woman with no prior cardiac history but multiple CRF who presents with a cc/o CP.  Mrs. Proffit has a PMH significant for insulin dependent type II diabetes, hypertension, dyslipidemia, and a family history of CAD. She had sudden onset of substernal chest pain radiating to both arms last night around 10 PM. She initially took an aspirin and the pain improved for a short time. When the pain worsened again she took one of her husband's nitroglycerin pills and the pain resolved. She then knew it was her heart and came to the ED. Her ECG showed ST depression. Her initial troponin was negative but she did later rule in for a non STEMI. She continued to have unstable CP and frequent PVCs and was taken to cath where she was found to have severe 3 vessel CAD. She is currently pain free.  In retrospect she says she has had to limit her walking due to pain in her arms since her back surgery.   Past Medical History:  Diagnosis Date  . Allergy   . Arthritis    neck  . Cataract    bilateral - MD monitoring cataracts  . Chronic kidney disease, stage I    DR OTTELIN  HX UTIS  . Cramp of limb   . Diabetes mellitus   . Dysphagia, unspecified(787.20)   . Dysuria   . Epistaxis   . GERD (gastroesophageal reflux disease)   . Heart murmur    NO CARDIOLOGIST  DX FOR YEARS ASYMPTOMATIC  . Lumbago   . Neoplasm of uncertain behavior of skin   . Nonspecific elevation of levels of transaminase or lactic acid dehydrogenase (LDH)   . Osteoarthrosis, unspecified whether generalized or localized, unspecified site   . Other and unspecified hyperlipidemia    diet controlled  . Pain in joint, shoulder region   . Paresthesias 04/01/2015  . Postablative ovarian failure   . Trochanteric bursitis of left hip 12/15/2015  . Type 2 diabetes mellitus without complication ([place])   . Unspecified  essential hypertension    no meds    Past Surgical History:  Procedure Laterality Date  . CARDIAC CATHETERIZATION N/A 01/27/2016   Procedure: Left Heart Cath and Coronary Angiography;  Surgeon: Belva Crome, MD;  Location: [facility];  Service: Cardiovascular;  Laterality: N/A;  . COLONOSCOPY  2012   [facility] - polyp  . MAXIMUM ACCESS (MAS)POSTERIOR LUMBAR INTERBODY FUSION (PLIF) 1 LEVEL Left 06/10/2015   Procedure: FOR MAXIMUM ACCESS (MAS) POSTERIOR LUMBAR INTERBODY FUSION (PLIF) LUMBAR THREE-FOUR EXTRAFORAMINAL MICRODISCECTOMY LUMBAR FIVE-SACRAL ONE LEFT;  Surgeon: Eustace Moore, MD;  Location: [facility];  Service: Neurosurgery;  Laterality: Left;  . TUBAL LIGATION  1982   Dr Connye Burkitt  . UPPER GASTROINTESTINAL ENDOSCOPY    . VAGINAL HYSTERECTOMY  1997   Dr Rande Lawman    Family History  Problem Relation Age of Onset  . Cancer Father   . Arthritis Sister   . Arthritis Brother   . Heart disease Maternal Grandmother   . Heart disease Maternal Grandfather   . Heart disease Paternal Grandmother   . Heart disease Paternal Grandfather   . Liver cancer Brother   . Colon cancer Neg Hx   . Esophageal cancer Neg Hx   . Rectal cancer Neg Hx   . Stomach cancer Neg Hx     Social History:  reports that she has never smoked. She has never used smokeless tobacco. She reports that she does not drink alcohol or use drugs.  Allergies:  Allergies  Allergen Reactions  . Kiwi Extract Anaphylaxis  . Tdap [Diphth-Acell Pertussis-Tetanus] Swelling    Swelling at injection site, gets very hot  . Tramadol Nausea And Vomiting  . Latex Itching, Dermatitis and Rash    Medications:  Prior to Admission:  Prescriptions Prior to Admission  Medication Sig Dispense Refill Last Dose  . AMBULATORY NON FORMULARY MEDICATION One Touch Ultra Blue Test Strips Sig: Use to test blood sugar three times daily to control blood sugar Dx: E11.9 100 each 11 01/26/2016 at Unknown time  . Biotin 10000 MCG TABS Take  10,000 mcg by mouth every morning.   01/26/2016 at Unknown time  . calcium carbonate (OS-CAL) 600 MG TABS Take 600 mg by mouth 2 (two) times daily with a meal.     01/26/2016 at Unknown time  . cholecalciferol (VITAMIN D) 1000 UNITS tablet Take 1,000 Units by mouth daily.     01/26/2016 at Unknown time  . ciprofloxacin (CIPRO) 250 MG tablet Take 250 mg by mouth 2 (two) times daily as needed (to prevent UTIs). Take one tablet after intercourse to prevent urinary tract infection   6 weeks  . Cyanocobalamin (VITAMIN B 12 PO) Take 1,000 mcg by mouth daily.     01/26/2016 at Unknown time  . fish oil-omega-3 fatty acids 1000 MG capsule Take 1 g by mouth daily.     01/26/2016 at Unknown time  . fluticasone (FLONASE) 50 MCG/ACT nasal spray Place 2 sprays into both nostrils 2 (two) times daily. Decrease to 2 sprays/nostril daily after 5 days (Patient taking differently: Place 2 sprays into both nostrils daily as needed for allergies. ) 16 g 2 unk  . Glucosamine 500 MG CAPS Take 2,000 mg by mouth 2 (two) times daily.   01/26/2016 at Unknown time  . glucose blood test strip Use as instructed to test blood sugar three times daily  Dx  250.00 100 each 12 01/26/2016 at Unknown time  . insulin detemir (LEVEMIR) 100 UNIT/ML injection Inject 45 Units into the skin at bedtime.    01/26/2016 at Unknown time  . Insulin Syringe-Needle U-100 (INSULIN SYRINGE .5CC/31GX5/16") 31G X 5/16" 0.5 ML MISC Use daily with the administration of insulin E11.9 100 each 11 01/26/2016 at Unknown time  . MAGNESIUM PO Take 500 mg by mouth 2 (two) times daily in the am and at bedtime..   01/26/2016 at Unknown time  . meloxicam (MOBIC) 15 MG tablet One daily to help arthritis and bursitis (Patient taking differently: Take 7.5 mg by mouth 2 (two) times daily. One daily to help arthritis and bursitis) 30 tablet 5 01/26/2016 at Unknown time  . metFORMIN (GLUCOPHAGE) 1000 MG tablet Take one tablet by mouth twice daily to treat diabetes 180 tablet 3 01/26/2016  at Unknown time  . Multiple Vitamins-Minerals (MULTIVITAMIN WITH MINERALS) tablet Take 1 tablet by mouth daily.     01/26/2016 at Unknown time  . omeprazole (PRILOSEC) 20 MG capsule TAKE ONE CAPSULE BY MOUTH DAILY 90 capsule 0 01/26/2016 at Unknown time  . Polyethyl Glycol-Propyl Glycol (SYSTANE OP) Place 1 drop into both eyes 2 (two) times daily.   01/26/2016 at Unknown time  . Probiotic Product (PROBIOTIC DAILY PO) Take 1 capsule by mouth daily. Digestive Advantage Probiotic   01/26/2016 at Unknown time  . Turmeric 500 MG CAPS Take 2 capsules by mouth  2 (two) times daily.   01/26/2016 at Unknown time    Results for orders placed or performed during the hospital encounter of 01/27/16 (from the past 48 hour(s))  Basic metabolic panel     Status: Abnormal   Collection Time: 01/26/16 11:49 PM  Result Value Ref Range   Sodium 135 135 - 145 mmol/L   Potassium 3.8 3.5 - 5.1 mmol/L   Chloride 99 (L) 101 - 111 mmol/L   CO2 20 (L) 22 - 32 mmol/L   Glucose, Bld 429 (H) 65 - 99 mg/dL   BUN 11 6 - 20 mg/dL   Creatinine, Ser 0.82 0.44 - 1.00 mg/dL   Calcium 9.7 8.9 - 10.3 mg/dL   GFR calc non Af Amer >60 >60 mL/min   GFR calc Af Amer >60 >60 mL/min    Comment: (NOTE) The eGFR has been calculated using the CKD EPI equation. This calculation has not been validated in all clinical situations. eGFR's persistently <60 mL/min signify possible Chronic Kidney Disease.    Anion gap 16 (H) 5 - 15  CBC     Status: Abnormal   Collection Time: 01/26/16 11:49 PM  Result Value Ref Range   WBC 5.6 4.0 - 10.5 K/uL   RBC 4.20 3.87 - 5.11 MIL/uL   Hemoglobin 11.1 (L) 12.0 - 15.0 g/dL   HCT 35.3 (L) 36.0 - 46.0 %   MCV 84.0 78.0 - 100.0 fL   MCH 26.4 26.0 - 34.0 pg   MCHC 31.4 30.0 - 36.0 g/dL   RDW 15.5 11.5 - 15.5 %   Platelets 156 150 - 400 K/uL  I-stat troponin, ED     Status: None   Collection Time: 01/26/16 11:59 PM  Result Value Ref Range   Troponin i, poc 0.00 0.00 - 0.08 ng/mL   Comment 3             Comment: Due to the release kinetics of cTnI, a negative result within the first hours of the onset of symptoms does not rule out myocardial infarction with certainty. If myocardial infarction is still suspected, repeat the test at appropriate intervals.   I-stat troponin, ED     Status: Abnormal   Collection Time: 01/27/16  4:21 AM  Result Value Ref Range   Troponin i, poc 0.14 (HH) 0.00 - 0.08 ng/mL   Comment NOTIFIED PHYSICIAN    Comment 3            Comment: Due to the release kinetics of cTnI, a negative result within the first hours of the onset of symptoms does not rule out myocardial infarction with certainty. If myocardial infarction is still suspected, repeat the test at appropriate intervals.   CBG monitoring, ED     Status: Abnormal   Collection Time: 01/27/16  5:11 AM  Result Value Ref Range   Glucose-Capillary 243 (H) 65 - 99 mg/dL  MRSA PCR Screening     Status: None   Collection Time: 01/27/16  7:06 AM  Result Value Ref Range   MRSA by PCR NEGATIVE NEGATIVE    Comment:        The GeneXpert MRSA Assay (FDA approved for NASAL specimens only), is one component of a comprehensive MRSA colonization surveillance program. It is not intended to diagnose MRSA infection nor to guide or monitor treatment for MRSA infections.   Glucose, capillary     Status: Abnormal   Collection Time: 01/27/16  7:34 AM  Result Value Ref Range   Glucose-Capillary 206 (  H) 65 - 99 mg/dL  Troponin I (q 6hr x 3)     Status: Abnormal   Collection Time: 01/27/16  7:51 AM  Result Value Ref Range   Troponin I 0.61 (HH) <0.03 ng/mL    Comment: CRITICAL RESULT CALLED TO, READ BACK BY AND VERIFIED WITH: H.MCGRATH,RN [id number] 01/27/16 CLARK,S   Heparin level (unfractionated)     Status: None   Collection Time: 01/27/16  7:51 AM  Result Value Ref Range   Heparin Unfractionated 0.45 0.30 - 0.70 IU/mL    Comment:        IF HEPARIN RESULTS ARE BELOW EXPECTED VALUES, AND PATIENT DOSAGE HAS  BEEN CONFIRMED, SUGGEST FOLLOW UP TESTING OF ANTITHROMBIN III LEVELS.   Protime-INR     Status: Abnormal   Collection Time: 01/27/16  7:51 AM  Result Value Ref Range   Prothrombin Time 15.5 (H) 11.4 - 15.2 seconds   INR 1.23   Glucose, capillary     Status: Abnormal   Collection Time: 01/27/16 11:07 AM  Result Value Ref Range   Glucose-Capillary 189 (H) 65 - 99 mg/dL  Troponin I (q 6hr x 3)     Status: Abnormal   Collection Time: 01/27/16 11:45 AM  Result Value Ref Range   Troponin I 2.19 (HH) <0.03 ng/mL    Comment: CRITICAL VALUE NOTED.  VALUE IS CONSISTENT WITH PREVIOUSLY REPORTED AND CALLED VALUE.  Glucose, capillary     Status: Abnormal   Collection Time: 01/27/16  4:16 PM  Result Value Ref Range   Glucose-Capillary 166 (H) 65 - 99 mg/dL    Dg Chest 2 View  Result Date: 01/27/2016 CLINICAL DATA:  Generalized chest pressure since 20/2 100 hours tonight. Bilateral arm pain. History of diabetes. EXAM: CHEST  2 VIEW COMPARISON:  06/02/2015 FINDINGS: Mild hyperinflation. Mild diffuse interstitial changes in the lungs with peribronchial thickening suggesting chronic bronchitis. No focal airspace disease or consolidation in the lungs. No blunting of costophrenic angles. No pneumothorax. Normal heart size and pulmonary vascularity. Calcification of the aorta. Degenerative changes in the spine and shoulders IMPRESSION: Chronic bronchitic changes. No evidence of active pulmonary disease. Electronically Signed   By: Lucienne Capers M.D.   On: 01/27/2016 00:19    Review of Systems  Constitutional: Positive for malaise/fatigue. Negative for chills and fever.  Eyes: Negative for blurred vision and double vision.  Respiratory: Negative for cough and shortness of breath.   Cardiovascular: Positive for chest pain. Negative for palpitations, claudication and leg swelling.  Gastrointestinal: Negative for abdominal pain and blood in stool.  Genitourinary: Negative for dysuria and urgency.   Musculoskeletal: Positive for back pain and joint pain.  Neurological: Negative for focal weakness, seizures and loss of consciousness.  Endo/Heme/Allergies: Negative for polydipsia. Does not bruise/bleed easily.  All other systems reviewed and are negative.  Blood pressure (!) 110/55, pulse 88, temperature 97.9 F (36.6 C), temperature source Oral, resp. rate 15, height _0  (1.575 m), weight 155 lb 8 oz (70.5 kg), SpO2 95 %. Physical Exam  Vitals reviewed. Constitutional: She is oriented to person, place, and time. She appears well-developed and well-nourished. No distress.  HENT:  Head: Normocephalic and atraumatic.  Eyes: Conjunctivae and EOM are normal. Pupils are equal, round, and reactive to light. No scleral icterus.  Neck: Normal range of motion. Neck supple. No JVD present. No thyromegaly present.  Cardiovascular: Normal rate, regular rhythm and intact distal pulses.  Exam reveals no gallop and no friction rub.   Murmur (2/6 systolic)  heard. Respiratory: Effort normal and breath sounds normal. No respiratory distress. She has no wheezes. She has no rales.  GI: Soft. She exhibits no distension. There is no tenderness.  Musculoskeletal: She exhibits no edema.  Lymphadenopathy:    She has no cervical adenopathy.  Neurological: She is alert and oriented to person, place, and time. No cranial nerve deficit.  Motor grossly intact   Skin: Skin is warm and dry.   CARDIAC CATHETERIZATION Conclusion     Prox RCA lesion, 75 %stenosed.  Ramus lesion, 95 %stenosed.  Prox LAD to Mid LAD lesion, 90 %stenosed.  Mid RCA to Dist RCA lesion, 100 %stenosed.  Dist RCA lesion, 100 %stenosed.  The left ventricular ejection fraction is 45-50% by visual estimate.  The left ventricular systolic function is normal.  LV end diastolic pressure is mildly elevated.    Acute coronary syndrome with recent total occlusion of the dominant RCA, high-grade obstruction in the proximal LAD  which Collateralizes the RCA, and high-grade obstruction and a moderate size ramus intermedius branch.  Left ventricular systolic dysfunction with inferobasal moderate hypokinesis and EF of 45-50%. Upper normal LV EDP.   RECOMMENDATIONS:  Long discussion with the patient concerning treatment options which include multivessel PCI / stenting vs coronary bypass grafting with LIMA to the LAD. If PCI, RCA will require near full metal jacket. After discussion, the patient would like the approach that will give her the most durable long-term success.  IV nitroglycerin, IV fluid, high intensity statin therapy, aspirin therapy, and beta blocker therapy as tolerated.  Expedited CABG because of presentation and critical nature of disease.    I personally reviewed the cath images and concur with Dr. Thompson Caul assessment  Assessment/Plan: 70 yo woman with multiple CRF who presents with an unstable coronary syndrome and ruled for a NSTEMI. At cath she has severe 3 vessel CAD with moderately impaired LV function. CABG is indicated for survival benefit and relief of symptoms.  I discussed the general nature of the procedure, the need for general anesthesia, the use of cardiopulmonary bypass, and the incisions to be used with Mrs. Lamphear and her family. We discussed the expected hospital stay, overall recovery and short and long term outcomes. I informed her of the indications, risks, benefits and alternatives. She understands the risks include, but are not limited to death, stroke, MI, DVT/PE, bleeding, possible need for transfusion, infections, cardiac arrhythmias, as well as other organ system dysfunction including respiratory, renal, or GI complications. She accepts the risks and agrees to proceed.  For CABG tomorrow AM  Melrose Nakayama 01/27/2016, 4:56 PM

## 2016-01-27 NOTE — H&P (Signed)
History and Physical    [name] LHT:[id number] DOB: 1945/06/26 DOA: 01/27/2016   PCP: Jeanmarie Hubert, MD Chief Complaint:  Chief Complaint  Patient presents with  . Chest Pain    HPI: [name] is a 70 y.o. female with medical history significant of DM2, HTN, HLD.  Patient presents to the ED with c/o chest pain.  Patient has been having exertional dyspnea, chest and bilateral arm pain for the past several months.  Worsening especially recently.  She becomes SOB with chest pain with even minimal activity at this point.  2 weeks ago she had a severe CP episode that resolved with ASA.  This evening she developed chest pressure at about 2145 while at rest.  Took ASA without relief, then finally took her husbands SL NTG with relief of chest pain.  At this point she realized that because the NTG worked there could be something wrong with her heart and she presents to the ED.  ED Course: EKG demonstrates significant ST segment depression and T wave inversion in inferior and lateral leads.  This is new compared to January of this year when they looked normal.  Her initial troponin was negative; however, her second troponin has turned positive.  Review of Systems: As per HPI otherwise 10 point review of systems negative.    Past Medical History:  Diagnosis Date  . Allergy   . Arthritis    neck  . Cataract    bilateral - MD monitoring cataracts  . Chronic kidney disease, stage I    DR OTTELIN  HX UTIS  . Cramp of limb   . Diabetes mellitus   . Dysphagia, unspecified(787.20)   . Dysuria   . Epistaxis   . GERD (gastroesophageal reflux disease)   . Heart murmur    NO CARDIOLOGIST  DX FOR YEARS ASYMPTOMATIC  . Lumbago   . Neoplasm of uncertain behavior of skin   . Nonspecific elevation of levels of transaminase or lactic acid dehydrogenase (LDH)   . Osteoarthrosis, unspecified whether generalized or localized, unspecified site   . Other and unspecified hyperlipidemia    diet controlled  . Pain in joint, shoulder region   . Paresthesias 04/01/2015  . Postablative ovarian failure   . Trochanteric bursitis of left hip 12/15/2015  . Type 2 diabetes mellitus without complication ([place])   . Unspecified essential hypertension    no meds    Past Surgical History:  Procedure Laterality Date  . COLONOSCOPY  2012   [name] - polyp  . MAXIMUM ACCESS (MAS)POSTERIOR LUMBAR INTERBODY FUSION (PLIF) 1 LEVEL Left 06/10/2015   Procedure: FOR MAXIMUM ACCESS (MAS) POSTERIOR LUMBAR INTERBODY FUSION (PLIF) LUMBAR THREE-FOUR EXTRAFORAMINAL MICRODISCECTOMY LUMBAR FIVE-SACRAL ONE LEFT;  Surgeon: Eustace Moore, MD;  Location: [facility];  Service: Neurosurgery;  Laterality: Left;  . TUBAL LIGATION  1982   Dr Connye Burkitt  . UPPER GASTROINTESTINAL ENDOSCOPY    . VAGINAL HYSTERECTOMY  1997   Dr Rande Lawman     reports that she has never smoked. She has never used smokeless tobacco. She reports that she does not drink alcohol or use drugs.  Allergies  Allergen Reactions  . Kiwi Extract Anaphylaxis  . Tdap [Diphth-Acell Pertussis-Tetanus] Swelling    Swelling at injection site, gets very hot  . Tramadol Nausea And Vomiting  . Latex Itching, Dermatitis and Rash    Family History  Problem Relation Age of Onset  . Cancer Father   . Arthritis Sister   . Arthritis Brother   .  Heart disease Maternal Grandmother   . Heart disease Maternal Grandfather   . Heart disease Paternal Grandmother   . Heart disease Paternal Grandfather   . Liver cancer Brother   . Colon cancer Neg Hx   . Esophageal cancer Neg Hx   . Rectal cancer Neg Hx   . Stomach cancer Neg Hx       Prior to Admission medications   Medication Sig Start Date End Date Taking? Authorizing Provider  AMBULATORY NON FORMULARY MEDICATION One Touch Ultra Blue Test Strips Sig: Use to test blood sugar three times daily to control blood sugar Dx: E11.9 09/29/14   Estill Dooms, MD  Biotin 10000 MCG TABS Take 10,000 mcg by mouth  every morning.    Historical Provider, MD  calcium carbonate (OS-CAL) 600 MG TABS Take 600 mg by mouth 2 (two) times daily with a meal.      Historical Provider, MD  cholecalciferol (VITAMIN D) 1000 UNITS tablet Take 1,000 Units by mouth daily.      Historical Provider, MD  Cinnamon 500 MG TABS Take 1,000 mg by mouth daily.     Historical Provider, MD  ciprofloxacin (CIPRO) 250 MG tablet Take 250 mg by mouth 2 (two) times daily as needed (to prevent UTIs). Take one tablet after intercourse to prevent urinary tract infection    Historical Provider, MD  Cyanocobalamin (VITAMIN B 12 PO) Take 1,000 mcg by mouth daily.      Historical Provider, MD  fish oil-omega-3 fatty acids 1000 MG capsule Take 1 g by mouth daily.      Historical Provider, MD  fluticasone (FLONASE) 50 MCG/ACT nasal spray Place 2 sprays into both nostrils 2 (two) times daily. Decrease to 2 sprays/nostril daily after 5 days Patient taking differently: Place 2 sprays into both nostrils daily as needed for allergies.  12/10/13   Liam Graham, PA-C  Glucosamine 500 MG CAPS Take 2,000 mg by mouth 2 (two) times daily.    Historical Provider, MD  glucose blood test strip Use as instructed to test blood sugar three times daily  Dx  250.00 09/04/13   Estill Dooms, MD  insulin detemir (LEVEMIR) 100 UNIT/ML injection Inject into the skin. Inject 45 units at bedtime to control blood sugar     Historical Provider, MD  Insulin Syringe-Needle U-100 (INSULIN SYRINGE .5CC/31GX5/16") 31G X 5/16" 0.5 ML MISC Use daily with the administration of insulin E11.9 03/31/15   Estill Dooms, MD  MAGNESIUM PO Take 500 mg by mouth 2 (two) times daily in the am and at bedtime.[name] Kitchen    Historical Provider, MD  meloxicam (MOBIC) 15 MG tablet One daily to help arthritis and bursitis 12/15/15   Estill Dooms, MD  metFORMIN (GLUCOPHAGE) 1000 MG tablet Take one tablet by mouth twice daily to treat diabetes 05/05/15   Estill Dooms, MD  Multiple Vitamins-Minerals  (MULTIVITAMIN WITH MINERALS) tablet Take 1 tablet by mouth daily.      Historical Provider, MD  omeprazole (PRILOSEC) 20 MG capsule TAKE ONE CAPSULE BY MOUTH DAILY 12/21/15   Estill Dooms, MD  Probiotic Product (PROBIOTIC DAILY PO) Take 1 capsule by mouth daily. Digestive Advantage Probiotic    Historical Provider, MD  Turmeric 500 MG CAPS Take 2 capsules by mouth 2 (two) times daily.    Historical Provider, MD    Physical Exam: Vitals:   01/27/16 0400 01/27/16 0415 01/27/16 0430 01/27/16 0500  BP: 141/74   148/84  Pulse: 93 92 94  96  Resp: 21 24 18 17   SpO2: 96% 95% 97% 96%      Constitutional: NAD, calm, comfortable Eyes: PERRL, lids and conjunctivae normal ENMT: Mucous membranes are moist. Posterior pharynx clear of any exudate or lesions.Normal dentition.  Neck: normal, supple, no masses, no thyromegaly Respiratory: clear to auscultation bilaterally, no wheezing, no crackles. Normal respiratory effort. No accessory muscle use.  Cardiovascular: Regular rate and rhythm, no murmurs / rubs / gallops. No extremity edema. 2+ pedal pulses. No carotid bruits.  Abdomen: no tenderness, no masses palpated. No hepatosplenomegaly. Bowel sounds positive.  Musculoskeletal: no clubbing / cyanosis. No joint deformity upper and lower extremities. Good ROM, no contractures. Normal muscle tone.  Skin: no rashes, lesions, ulcers. No induration Neurologic: CN 2-12 grossly intact. Sensation intact, DTR normal. Strength 5/5 in all 4.  Psychiatric: Normal judgment and insight. Alert and oriented x 3. Normal mood.    Labs on Admission: I have personally reviewed following labs and imaging studies  CBC:  Recent Labs Lab 01/26/16 2349  WBC 5.6  HGB 11.1*  HCT 35.3*  MCV 84.0  PLT 203   Basic Metabolic Panel:  Recent Labs Lab 01/26/16 2349  NA 135  K 3.8  CL 99*  CO2 20*  GLUCOSE 429*  BUN 11  CREATININE 0.82  CALCIUM 9.7   GFR: CrCl cannot be calculated (Unknown ideal  weight.). Liver Function Tests: No results for input(s): AST, ALT, ALKPHOS, BILITOT, PROT, ALBUMIN in the last 168 hours. No results for input(s): LIPASE, AMYLASE in the last 168 hours. No results for input(s): AMMONIA in the last 168 hours. Coagulation Profile: No results for input(s): INR, PROTIME in the last 168 hours. Cardiac Enzymes: No results for input(s): CKTOTAL, CKMB, CKMBINDEX, TROPONINI in the last 168 hours. BNP (last 3 results) No results for input(s): PROBNP in the last 8760 hours. HbA1C: No results for input(s): HGBA1C in the last 72 hours. CBG: No results for input(s): GLUCAP in the last 168 hours. Lipid Profile: No results for input(s): CHOL, HDL, LDLCALC, TRIG, CHOLHDL, LDLDIRECT in the last 72 hours. Thyroid Function Tests: No results for input(s): TSH, T4TOTAL, FREET4, T3FREE, THYROIDAB in the last 72 hours. Anemia Panel: No results for input(s): VITAMINB12, FOLATE, FERRITIN, TIBC, IRON, RETICCTPCT in the last 72 hours. Urine analysis:    Component Value Date/Time   COLORURINE YELLOW 05/10/2008 1300   APPEARANCEUR CLEAR 05/10/2008 1300   LABSPEC 1.015 12/18/2011 1409   PHURINE 7.0 12/18/2011 1409   GLUCOSEU NEGATIVE 12/18/2011 1409   HGBUR LARGE (A) 12/18/2011 1409   BILIRUBINUR neg 05/05/2012 1445   KETONESUR NEGATIVE 12/18/2011 1409   PROTEINUR neg 05/05/2012 1445   PROTEINUR 100 (A) 12/18/2011 1409   UROBILINOGEN 0.2 05/05/2012 1445   UROBILINOGEN 0.2 12/18/2011 1409   NITRITE neg 05/05/2012 1445   NITRITE POSITIVE (A) 12/18/2011 1409   LEUKOCYTESUR large (3+) 05/05/2012 1445   Sepsis Labs: @LABRCNTIP (procalcitonin:4,lacticidven:4) )No results found for this or any previous visit (from the past 240 hour(s)).   Radiological Exams on Admission: Dg Chest 2 View  Result Date: 01/27/2016 CLINICAL DATA:  Generalized chest pressure since 20/2 100 hours tonight. Bilateral arm pain. History of diabetes. EXAM: CHEST  2 VIEW COMPARISON:  06/02/2015  FINDINGS: Mild hyperinflation. Mild diffuse interstitial changes in the lungs with peribronchial thickening suggesting chronic bronchitis. No focal airspace disease or consolidation in the lungs. No blunting of costophrenic angles. No pneumothorax. Normal heart size and pulmonary vascularity. Calcification of the aorta. Degenerative changes in the spine  and shoulders IMPRESSION: Chronic bronchitic changes. No evidence of active pulmonary disease. Electronically Signed   By: Lucienne Capers M.D.   On: 01/27/2016 00:19    EKG: Independently reviewed.  Assessment/Plan Principal Problem:   NSTEMI (non-ST elevated myocardial infarction) ([place]) Active Problems:   Hyperlipemia   Diabetes mellitus type 2, controlled ([place])   Essential hypertension    1. NSTEMI - chest pain, ekg abnormalities, and now troponin is turning positive it would appear. 1. ACS pathway 2. Cards consulted and seeing patient now- baring any other developments, anticipate patient to go to cath lab hopefully later this morning 3. Nitro gtt 4. Heparin gtt 5. ASA - patient has had full ASA 6. Starting 63m Lipitor QHS (titrate up as tolerated) 7. Morphine PRN breakthrough pain 8. Tele monitor 2. DM2 - 1. Levemir 25 QHS 2. Sensitive scale SSI Q4H 3. Holding metformin 3. HTN - Beta blocker and ACEi likely to be started as BP permits (when off NTG gtt)  DVT prophylaxis: Heparin gtt Code Status: Full Family Communication: Husband at bedside (who incidentally reports he has a coronary stent himself) Consults called: Cardiology consulted Admission status: Admit to inpatient SDU   GEtta QuillDO [facility] Pager [phone] 7PM-7AM  If 7AM-7PM, please contact the day physician for the patient [email or web address] Password [email or web address]  01/27/2016, 5:04 AM

## 2016-01-27 NOTE — Progress Notes (Signed)
CABG pre and post procedure education completed including DBE, IS, Wound splinting,infection control, use of pain med, ambulation and exercise .

## 2016-01-27 NOTE — Interval H&P Note (Signed)
Cath Lab Visit (complete for each Cath Lab visit)  Clinical Evaluation Leading to the Procedure:   ACS: Yes.    Non-ACS:    Anginal Classification: CCS Conway  Anti-ischemic medical therapy: No Therapy  Non-Invasive Test Results: No non-invasive testing performed  Prior CABG: No previous CABG      History and Physical Interval Note:  01/27/2016 8:41 AM  [name]  has presented today for surgery, with the diagnosis of cp  The various methods of treatment have been discussed with the patient and family. After consideration of risks, benefits and other options for treatment, the patient has consented to  Procedure(s): Left Heart Cath and Coronary Angiography (N/A) as a surgical intervention .  The patient's history has been reviewed, patient examined, no change in status, stable for surgery.  I have reviewed the patient's chart and labs.  Questions were answered to the patient's satisfaction.     [name]

## 2016-01-27 NOTE — ED Notes (Signed)
Dr.Pollina notified of elevated troponin

## 2016-01-27 NOTE — Progress Notes (Signed)
ANTICOAGULATION CONSULT NOTE - Initial Consult  Pharmacy Consult for Heparin Indication: chest pain/ACS  Allergies  Allergen Reactions  . Kiwi Extract Anaphylaxis  . Tdap [Diphth-Acell Pertussis-Tetanus] Swelling    Swelling at injection site, gets very hot  . Tramadol Nausea And Vomiting  . Latex Itching, Dermatitis and Rash    Patient Measurements:    Ht: 62.5 in  Wt 71 kg  IBW: 51 kg Heparin Dosing Weight: 66 kg  Vital Signs: BP: 141/75 (09/20 0330) Pulse Rate: 90 (09/20 0330)  Labs:  Recent Labs  01/26/16 2349  HGB 11.1*  HCT 35.3*  PLT 156  CREATININE 0.82    CrCl cannot be calculated (Unknown ideal weight.).   Medical History: Past Medical History:  Diagnosis Date  . Allergy   . Arthritis    neck  . Cataract    bilateral - MD monitoring cataracts  . Chronic kidney disease, stage I    DR OTTELIN  HX UTIS  . Cramp of limb   . Diabetes mellitus   . Dysphagia, unspecified(787.20)   . Dysuria   . Epistaxis   . GERD (gastroesophageal reflux disease)   . Heart murmur    NO CARDIOLOGIST  DX FOR YEARS ASYMPTOMATIC  . Lumbago   . Neoplasm of uncertain behavior of skin   . Nonspecific elevation of levels of transaminase or lactic acid dehydrogenase (LDH)   . Osteoarthrosis, unspecified whether generalized or localized, unspecified site   . Other and unspecified hyperlipidemia    diet controlled  . Pain in joint, shoulder region   . Paresthesias 04/01/2015  . Postablative ovarian failure   . Trochanteric bursitis of left hip 12/15/2015  . Type 2 diabetes mellitus without complication ([place])   . Unspecified essential hypertension    no meds    Medications:  See electronic med rec  Assessment: 70 y.o. F presents with CP and EKG changes. Also with trop elevated to 0.14. To begin heparin for r/o ACS. No AC PTA. Hgb 11.1 (stable), plt 156.   Goal of Therapy:  Heparin level 0.3-0.7 units/ml Monitor platelets by anticoagulation protocol: Yes   Plan:   Heparin IV bolus 4000 units Heparin gtt at 800 units/hr Will f/u heparin level in 6 hours Daily heparin level and CBC  Sherlon Handing, PharmD, BCPS Clinical pharmacist, pager [phone] 01/27/2016,4:45 AM

## 2016-01-27 NOTE — Progress Notes (Signed)
[facility] for Heparin Indication: chest pain/ACS  Allergies  Allergen Reactions  . Kiwi Extract Anaphylaxis  . Tdap [Diphth-Acell Pertussis-Tetanus] Swelling    Swelling at injection site, gets very hot  . Tramadol Nausea And Vomiting  . Latex Itching, Dermatitis and Rash    Patient Measurements: Height: 5' 2"  (157.5 cm) Weight: 155 lb 8 oz (70.5 kg) IBW/kg (Calculated) : 50.1  Ht: 62.5 in  Wt 71 kg  IBW: 51 kg Heparin Dosing Weight: 66 kg  Vital Signs: Temp: 98.8 F (37.1 C) (09/20 0702) Temp Source: Oral (09/20 0702) BP: 110/55 (09/20 0927) Pulse Rate: 0 (09/20 0937)  Labs:  Recent Labs  01/26/16 2349 01/27/16 0751  HGB 11.1*  --   HCT 35.3*  --   PLT 156  --   HEPARINUNFRC  --  0.45  CREATININE 0.82  --   TROPONINI  --  0.61*    Estimated Creatinine Clearance: 59.6 mL/min (by C-G formula based on SCr of 0.82 mg/dL).   Medical History: Past Medical History:  Diagnosis Date  . Allergy   . Arthritis    neck  . Cataract    bilateral - MD monitoring cataracts  . Chronic kidney disease, stage I    DR OTTELIN  HX UTIS  . Cramp of limb   . Diabetes mellitus   . Dysphagia, unspecified(787.20)   . Dysuria   . Epistaxis   . GERD (gastroesophageal reflux disease)   . Heart murmur    NO CARDIOLOGIST  DX FOR YEARS ASYMPTOMATIC  . Lumbago   . Neoplasm of uncertain behavior of skin   . Nonspecific elevation of levels of transaminase or lactic acid dehydrogenase (LDH)   . Osteoarthrosis, unspecified whether generalized or localized, unspecified site   . Other and unspecified hyperlipidemia    diet controlled  . Pain in joint, shoulder region   . Paresthesias 04/01/2015  . Postablative ovarian failure   . Trochanteric bursitis of left hip 12/15/2015  . Type 2 diabetes mellitus without complication ([place])   . Unspecified essential hypertension    no meds     Assessment: 70 y.o. F presents here with CP and s/p cath with  multivessel CAD. Plans noted for CABG. Pharmacy consulted to begin heparin 8 hours post sheath removal (removed at ~ 9:30am, TR band placed) --heparin was last at 800 units/hr with heparin level= 0.45  Goal of Therapy:  Heparin level 0.3-0.7 units/ml Monitor platelets by anticoagulation protocol: Yes   Plan:  -Restart heparin at 800 units/hr at 5:30pm -heparin level and CBC in am  Hildred Laser, Pharm D 01/27/2016 10:38 AM

## 2016-01-28 ENCOUNTER — Inpatient Hospital Stay (HOSPITAL_COMMUNITY): Payer: Medicare Other | Admitting: Anesthesiology

## 2016-01-28 ENCOUNTER — Inpatient Hospital Stay (HOSPITAL_COMMUNITY): Payer: Medicare Other

## 2016-01-28 ENCOUNTER — Encounter (HOSPITAL_COMMUNITY)
Admission: EM | Disposition: A | Discharge status: Home / Self Care | Payer: Self-pay | Source: Home / Self Care | Attending: Thoracic Surgery (Cardiothoracic Vascular Surgery)

## 2016-01-28 ENCOUNTER — Encounter (HOSPITAL_COMMUNITY): Payer: Self-pay | Admitting: Certified Registered"

## 2016-01-28 HISTORY — PX: CORONARY ARTERY BYPASS GRAFT: SHX141

## 2016-01-28 HISTORY — PX: TEE WITHOUT CARDIOVERSION: SHX5443

## 2016-01-28 HISTORY — PX: ENDOVEIN HARVEST OF GREATER SAPHENOUS VEIN: SHX5059

## 2016-01-28 LAB — CBC
HCT: 29.9 % — ABNORMAL LOW (ref 36.0–46.0)
HEMATOCRIT: 24.7 % — AB (ref 36.0–46.0)
HEMOGLOBIN: 9.4 g/dL — AB (ref 12.0–15.0)
Hemoglobin: 8 g/dL — ABNORMAL LOW (ref 12.0–15.0)
MCH: 27 pg (ref 26.0–34.0)
MCH: 26.7 pg (ref 26.0–34.0)
MCHC: 32.4 g/dL (ref 30.0–36.0)
MCHC: 31.4 g/dL (ref 30.0–36.0)
MCV: 83.4 fL (ref 78.0–100.0)
MCV: 84.9 fL (ref 78.0–100.0)
PLATELETS: 117 10*3/uL — AB (ref 150–400)
PLATELETS: 152 10*3/uL (ref 150–400)
RBC: 2.96 MIL/uL — ABNORMAL LOW (ref 3.87–5.11)
RBC: 3.52 MIL/uL — AB (ref 3.87–5.11)
RDW: 16 % — AB (ref 11.5–15.5)
RDW: 16.1 % — ABNORMAL HIGH (ref 11.5–15.5)
WBC: 13.6 10*3/uL — ABNORMAL HIGH (ref 4.0–10.5)
WBC: 13.4 10*3/uL — AB (ref 4.0–10.5)

## 2016-01-28 LAB — POCT I-STAT, CHEM 8
BUN: 10 mg/dL (ref 6–20)
BUN: 10 mg/dL (ref 6–20)
BUN: 9 mg/dL (ref 6–20)
BUN: 9 mg/dL (ref 6–20)
BUN: 9 mg/dL (ref 6–20)
CALCIUM ION: 1.18 mmol/L (ref 1.15–1.40)
CALCIUM ION: 1.04 mmol/L — AB (ref 1.15–1.40)
CHLORIDE: 103 mmol/L (ref 101–111)
CHLORIDE: 102 mmol/L (ref 101–111)
CREATININE: 0.3 mg/dL — AB (ref 0.44–1.00)
CREATININE: 0.3 mg/dL — AB (ref 0.44–1.00)
CREATININE: 0.4 mg/dL — AB (ref 0.44–1.00)
Calcium, Ion: 1.21 mmol/L (ref 1.15–1.40)
Calcium, Ion: 1.02 mmol/L — ABNORMAL LOW (ref 1.15–1.40)
Calcium, Ion: 1.09 mmol/L — ABNORMAL LOW (ref 1.15–1.40)
Chloride: 104 mmol/L (ref 101–111)
Chloride: 100 mmol/L — ABNORMAL LOW (ref 101–111)
Chloride: 104 mmol/L (ref 101–111)
Creatinine, Ser: 0.3 mg/dL — ABNORMAL LOW (ref 0.44–1.00)
Creatinine, Ser: 0.3 mg/dL — ABNORMAL LOW (ref 0.44–1.00)
GLUCOSE: 116 mg/dL — AB (ref 65–99)
GLUCOSE: 102 mg/dL — AB (ref 65–99)
GLUCOSE: 183 mg/dL — AB (ref 65–99)
Glucose, Bld: 88 mg/dL (ref 65–99)
Glucose, Bld: 107 mg/dL — ABNORMAL HIGH (ref 65–99)
HCT: 27 % — ABNORMAL LOW (ref 36.0–46.0)
HCT: 23 % — ABNORMAL LOW (ref 36.0–46.0)
HEMATOCRIT: 27 % — AB (ref 36.0–46.0)
HEMATOCRIT: 25 % — AB (ref 36.0–46.0)
HEMATOCRIT: 23 % — AB (ref 36.0–46.0)
HEMOGLOBIN: 9.2 g/dL — AB (ref 12.0–15.0)
HEMOGLOBIN: 8.5 g/dL — AB (ref 12.0–15.0)
HEMOGLOBIN: 9.2 g/dL — AB (ref 12.0–15.0)
HEMOGLOBIN: 7.8 g/dL — AB (ref 12.0–15.0)
Hemoglobin: 7.8 g/dL — ABNORMAL LOW (ref 12.0–15.0)
POTASSIUM: 3.5 mmol/L (ref 3.5–5.1)
POTASSIUM: 4 mmol/L (ref 3.5–5.1)
POTASSIUM: 3.7 mmol/L (ref 3.5–5.1)
Potassium: 3.5 mmol/L (ref 3.5–5.1)
Potassium: 3.3 mmol/L — ABNORMAL LOW (ref 3.5–5.1)
SODIUM: 143 mmol/L (ref 135–145)
Sodium: 141 mmol/L (ref 135–145)
Sodium: 141 mmol/L (ref 135–145)
Sodium: 142 mmol/L (ref 135–145)
Sodium: 139 mmol/L (ref 135–145)
TCO2: 29 mmol/L (ref 0–100)
TCO2: 26 mmol/L (ref 0–100)
TCO2: 29 mmol/L (ref 0–100)
TCO2: 28 mmol/L (ref 0–100)
TCO2: 28 mmol/L (ref 0–100)

## 2016-01-28 LAB — POCT I-STAT 3, ART BLOOD GAS (G3+)
ACID-BASE DEFICIT: 2 mmol/L (ref 0.0–2.0)
ACID-BASE DEFICIT: 4 mmol/L — AB (ref 0.0–2.0)
ACID-BASE DEFICIT: 4 mmol/L — AB (ref 0.0–2.0)
ACID-BASE EXCESS: 2 mmol/L (ref 0.0–2.0)
BICARBONATE: 22.9 mmol/L (ref 20.0–28.0)
Bicarbonate: 26.8 mmol/L (ref 20.0–28.0)
Bicarbonate: 20.3 mmol/L (ref 20.0–28.0)
Bicarbonate: 19.9 mmol/L — ABNORMAL LOW (ref 20.0–28.0)
O2 SAT: 98 %
O2 SAT: 94 %
O2 Saturation: 100 %
O2 Saturation: 94 %
PCO2 ART: 34.6 mmHg (ref 32.0–48.0)
PH ART: 7.433 (ref 7.350–7.450)
PH ART: 7.383 (ref 7.350–7.450)
PO2 ART: 73 mmHg — AB (ref 83.0–108.0)
Patient temperature: 38.1
TCO2: 28 mmol/L (ref 0–100)
TCO2: 24 mmol/L (ref 0–100)
TCO2: 21 mmol/L (ref 0–100)
TCO2: 21 mmol/L (ref 0–100)
pCO2 arterial: 40.2 mmHg (ref 32.0–48.0)
pCO2 arterial: 42.4 mmHg (ref 32.0–48.0)
pCO2 arterial: 33.9 mmHg (ref 32.0–48.0)
pH, Arterial: 7.343 — ABNORMAL LOW (ref 7.350–7.450)
pH, Arterial: 7.38 (ref 7.350–7.450)
pO2, Arterial: 350 mmHg — ABNORMAL HIGH (ref 83.0–108.0)
pO2, Arterial: 119 mmHg — ABNORMAL HIGH (ref 83.0–108.0)
pO2, Arterial: 77 mmHg — ABNORMAL LOW (ref 83.0–108.0)

## 2016-01-28 LAB — POCT I-STAT 4, (NA,K, GLUC, HGB,HCT)
GLUCOSE: 136 mg/dL — AB (ref 65–99)
HEMATOCRIT: 26 % — AB (ref 36.0–46.0)
Hemoglobin: 8.8 g/dL — ABNORMAL LOW (ref 12.0–15.0)
Potassium: 3.5 mmol/L (ref 3.5–5.1)
SODIUM: 143 mmol/L (ref 135–145)

## 2016-01-28 LAB — GLUCOSE, CAPILLARY
GLUCOSE-CAPILLARY: 159 mg/dL — AB (ref 65–99)
GLUCOSE-CAPILLARY: 91 mg/dL (ref 65–99)
GLUCOSE-CAPILLARY: 174 mg/dL — AB (ref 65–99)
GLUCOSE-CAPILLARY: 163 mg/dL — AB (ref 65–99)
Glucose-Capillary: 112 mg/dL — ABNORMAL HIGH (ref 65–99)
Glucose-Capillary: 162 mg/dL — ABNORMAL HIGH (ref 65–99)
Glucose-Capillary: 180 mg/dL — ABNORMAL HIGH (ref 65–99)
Glucose-Capillary: 187 mg/dL — ABNORMAL HIGH (ref 65–99)
Glucose-Capillary: 82 mg/dL (ref 65–99)
Glucose-Capillary: 87 mg/dL (ref 65–99)

## 2016-01-28 LAB — PROTIME-INR
INR: 1.63
Prothrombin Time: 19.5 seconds — ABNORMAL HIGH (ref 11.4–15.2)

## 2016-01-28 LAB — BLOOD GAS, ARTERIAL
Acid-Base Excess: 1.5 mmol/L (ref 0.0–2.0)
Bicarbonate: 24.5 mmol/L (ref 20.0–28.0)
DRAWN BY: 345601
FIO2: 0.21
O2 SAT: 94.3 %
PCO2 ART: 31.8 mmHg — AB (ref 32.0–48.0)
PH ART: 7.498 — AB (ref 7.350–7.450)
Patient temperature: 98.6
pO2, Arterial: 68.3 mmHg — ABNORMAL LOW (ref 83.0–108.0)

## 2016-01-28 LAB — MAGNESIUM: MAGNESIUM: 2.7 mg/dL — AB (ref 1.7–2.4)

## 2016-01-28 LAB — CREATININE, SERUM: Creatinine, Ser: 0.58 mg/dL (ref 0.44–1.00)

## 2016-01-28 LAB — PREPARE RBC (CROSSMATCH)

## 2016-01-28 LAB — APTT: APTT: 35 s (ref 24–36)

## 2016-01-28 LAB — HEMOGLOBIN AND HEMATOCRIT, BLOOD
HEMATOCRIT: 20.9 % — AB (ref 36.0–46.0)
HEMOGLOBIN: 6.7 g/dL — AB (ref 12.0–15.0)

## 2016-01-28 LAB — PLATELET COUNT: PLATELETS: 125 10*3/uL — AB (ref 150–400)

## 2016-01-28 SURGERY — CORONARY ARTERY BYPASS GRAFTING (CABG)
Anesthesia: General | Site: Leg Upper | Laterality: Right

## 2016-01-28 MED ORDER — DEXMEDETOMIDINE HCL IN NACL 200 MCG/50ML IV SOLN
INTRAVENOUS | Status: DC | PRN
  Administered 2016-01-28: 0.4 ug/kg/h via INTRAVENOUS

## 2016-01-28 MED ORDER — HEMOSTATIC AGENTS (NO CHARGE) OPTIME
TOPICAL | Status: DC | PRN
  Administered 2016-01-28: 1 via TOPICAL

## 2016-01-28 MED ORDER — ACETAMINOPHEN 500 MG PO TABS
1000.0000 mg | ORAL_TABLET | Freq: Four times a day (QID) | ORAL | Status: DC
  Administered 2016-01-28 – 2016-01-30 (×5): 1000 mg via ORAL
  Filled 2016-01-28 (×6): qty 2

## 2016-01-28 MED ORDER — MIDAZOLAM HCL 10 MG/2ML IJ SOLN
INTRAMUSCULAR | Status: AC
  Filled 2016-01-28: qty 2

## 2016-01-28 MED ORDER — OXYCODONE HCL 5 MG PO TABS
5.0000 mg | ORAL_TABLET | ORAL | Status: DC | PRN
  Administered 2016-01-29 – 2016-01-31 (×6): 10 mg via ORAL
  Administered 2016-01-31: 5 mg via ORAL
  Filled 2016-01-28 (×2): qty 2
  Filled 2016-01-28: qty 1
  Filled 2016-01-28 (×4): qty 2

## 2016-01-28 MED ORDER — VANCOMYCIN HCL IN DEXTROSE 1-5 GM/200ML-% IV SOLN
1000.0000 mg | Freq: Once | INTRAVENOUS | Status: AC
  Administered 2016-01-28: 1000 mg via INTRAVENOUS
  Filled 2016-01-28: qty 200

## 2016-01-28 MED ORDER — CHLORHEXIDINE GLUCONATE CLOTH 2 % EX PADS
6.0000 | MEDICATED_PAD | Freq: Once | CUTANEOUS | Status: AC
  Administered 2016-01-28: 6 via TOPICAL

## 2016-01-28 MED ORDER — ACETAMINOPHEN 160 MG/5ML PO SOLN: 1000.0000 mg | Freq: Four times a day (QID) | ORAL | Status: DC

## 2016-01-28 MED ORDER — LACTATED RINGERS IV SOLN
INTRAVENOUS | Status: DC | PRN
  Administered 2016-01-28: 07:00:00 via INTRAVENOUS

## 2016-01-28 MED ORDER — SODIUM CHLORIDE 0.9 % IV SOLN
INTRAVENOUS | Status: DC
  Administered 2016-01-28: 14:00:00 via INTRAVENOUS

## 2016-01-28 MED ORDER — LACTATED RINGERS IV SOLN: INTRAVENOUS | Status: DC

## 2016-01-28 MED ORDER — PROPOFOL 10 MG/ML IV BOLUS
INTRAVENOUS | Status: DC | PRN
  Administered 2016-01-28: 100 mg via INTRAVENOUS

## 2016-01-28 MED ORDER — ASPIRIN 81 MG PO CHEW
324.0000 mg | CHEWABLE_TABLET | Freq: Every day | ORAL | Status: DC
  Filled 2016-01-28: qty 4

## 2016-01-28 MED ORDER — ALBUMIN HUMAN 5 % IV SOLN
12.5000 g | Freq: Once | INTRAVENOUS | Status: AC
  Administered 2016-01-28: 12.5 g via INTRAVENOUS

## 2016-01-28 MED ORDER — CHLORHEXIDINE GLUCONATE 0.12 % MT SOLN
15.0000 mL | Freq: Once | OROMUCOSAL | Status: AC
  Administered 2016-01-28: 15 mL via OROMUCOSAL
  Filled 2016-01-28: qty 15

## 2016-01-28 MED ORDER — ASPIRIN EC 325 MG PO TBEC
325.0000 mg | DELAYED_RELEASE_TABLET | Freq: Every day | ORAL | Status: DC
  Administered 2016-01-29 – 2016-01-31 (×3): 325 mg via ORAL
  Filled 2016-01-28 (×3): qty 1

## 2016-01-28 MED ORDER — SODIUM CHLORIDE 0.9% FLUSH: 3.0000 mL | INTRAVENOUS | Status: DC | PRN

## 2016-01-28 MED ORDER — METOPROLOL TARTRATE 12.5 MG HALF TABLET
12.5000 mg | ORAL_TABLET | Freq: Two times a day (BID) | ORAL | Status: DC
  Administered 2016-01-29 – 2016-01-31 (×4): 12.5 mg via ORAL
  Filled 2016-01-28 (×4): qty 1

## 2016-01-28 MED ORDER — HEPARIN SODIUM (PORCINE) 1000 UNIT/ML IJ SOLN
INTRAMUSCULAR | Status: DC | PRN
  Administered 2016-01-28: 23000 [IU] via INTRAVENOUS
  Administered 2016-01-28: 2000 [IU] via INTRAVENOUS

## 2016-01-28 MED ORDER — SODIUM CHLORIDE 0.9% FLUSH
3.0000 mL | Freq: Two times a day (BID) | INTRAVENOUS | Status: DC
  Administered 2016-01-29 – 2016-01-31 (×5): 3 mL via INTRAVENOUS

## 2016-01-28 MED ORDER — METOPROLOL TARTRATE 25 MG/10 ML ORAL SUSPENSION
12.5000 mg | Freq: Two times a day (BID) | ORAL | Status: DC
  Administered 2016-01-29: 12.5 mg
  Filled 2016-01-28: qty 5

## 2016-01-28 MED ORDER — VECURONIUM BROMIDE 10 MG IV SOLR
INTRAVENOUS | Status: DC | PRN
  Administered 2016-01-28: 2 mg via INTRAVENOUS
  Administered 2016-01-28: 3 mg via INTRAVENOUS
  Administered 2016-01-28: 2 mg via INTRAVENOUS
  Administered 2016-01-28: 3 mg via INTRAVENOUS
  Administered 2016-01-28: 7 mg via INTRAVENOUS

## 2016-01-28 MED ORDER — LACTATED RINGERS IV SOLN
INTRAVENOUS | Status: DC
  Administered 2016-01-28: 14:00:00 via INTRAVENOUS

## 2016-01-28 MED ORDER — SODIUM CHLORIDE 0.9 % IV SOLN
INTRAVENOUS | Status: DC
  Administered 2016-01-28: 21:00:00 via INTRAVENOUS
  Filled 2016-01-28 (×2): qty 2.5

## 2016-01-28 MED ORDER — LACTATED RINGERS IV SOLN: 500.0000 mL | Freq: Once | INTRAVENOUS | Status: DC | PRN

## 2016-01-28 MED ORDER — HEPARIN SODIUM (PORCINE) 1000 UNIT/ML IJ SOLN
INTRAMUSCULAR | Status: AC
  Filled 2016-01-28: qty 1

## 2016-01-28 MED ORDER — ACETAMINOPHEN 650 MG RE SUPP
650.0000 mg | Freq: Once | RECTAL | Status: AC
  Administered 2016-01-28: 650 mg via RECTAL

## 2016-01-28 MED ORDER — SUFENTANIL CITRATE 250 MCG/5ML IV SOLN
INTRAVENOUS | Status: AC
  Filled 2016-01-28: qty 5

## 2016-01-28 MED ORDER — METOPROLOL TARTRATE 5 MG/5ML IV SOLN: 2.5000 mg | INTRAVENOUS | Status: DC | PRN

## 2016-01-28 MED ORDER — 0.9 % SODIUM CHLORIDE (POUR BTL) OPTIME
TOPICAL | Status: DC | PRN
  Administered 2016-01-28: 5000 mL

## 2016-01-28 MED ORDER — PROPOFOL 10 MG/ML IV BOLUS
INTRAVENOUS | Status: AC
  Filled 2016-01-28: qty 20

## 2016-01-28 MED ORDER — SODIUM CHLORIDE 0.45 % IV SOLN
INTRAVENOUS | Status: DC | PRN
  Administered 2016-01-28: 14:00:00 via INTRAVENOUS

## 2016-01-28 MED ORDER — SODIUM CHLORIDE 0.9 % IV SOLN
INTRAVENOUS | Status: DC | PRN
  Administered 2016-01-28: 1.1 [IU]/h via INTRAVENOUS

## 2016-01-28 MED ORDER — ARTIFICIAL TEARS OP OINT
TOPICAL_OINTMENT | OPHTHALMIC | Status: AC
  Filled 2016-01-28: qty 3.5

## 2016-01-28 MED ORDER — VECURONIUM BROMIDE 10 MG IV SOLR
INTRAVENOUS | Status: AC
  Filled 2016-01-28: qty 10

## 2016-01-28 MED ORDER — MAGNESIUM SULFATE 4 GM/100ML IV SOLN
4.0000 g | Freq: Once | INTRAVENOUS | Status: AC
  Administered 2016-01-28: 4 g via INTRAVENOUS
  Filled 2016-01-28: qty 100

## 2016-01-28 MED ORDER — CALCIUM CARBONATE 1250 (500 CA) MG PO TABS
1.0000 | ORAL_TABLET | Freq: Two times a day (BID) | ORAL | Status: DC
  Administered 2016-01-29 – 2016-02-03 (×11): 500 mg via ORAL
  Filled 2016-01-28 (×12): qty 1

## 2016-01-28 MED ORDER — NITROGLYCERIN IN D5W 200-5 MCG/ML-% IV SOLN: 0.0000 ug/min | INTRAVENOUS | Status: DC

## 2016-01-28 MED ORDER — SUFENTANIL CITRATE 50 MCG/ML IV SOLN
INTRAVENOUS | Status: DC | PRN
  Administered 2016-01-28: 10 ug via INTRAVENOUS
  Administered 2016-01-28: 20 ug via INTRAVENOUS
  Administered 2016-01-28: 5 ug via INTRAVENOUS
  Administered 2016-01-28: 10 ug via INTRAVENOUS
  Administered 2016-01-28: 35 ug via INTRAVENOUS
  Administered 2016-01-28 (×3): 20 ug via INTRAVENOUS

## 2016-01-28 MED ORDER — POTASSIUM CHLORIDE 10 MEQ/50ML IV SOLN: 10.0000 meq | Freq: Once | INTRAVENOUS | Status: DC

## 2016-01-28 MED ORDER — SODIUM CHLORIDE 0.9 % IV SOLN: 250.0000 mL | INTRAVENOUS | Status: DC

## 2016-01-28 MED ORDER — PANTOPRAZOLE SODIUM 40 MG PO TBEC
40.0000 mg | DELAYED_RELEASE_TABLET | Freq: Every day | ORAL | Status: DC
  Administered 2016-01-30 – 2016-01-31 (×2): 40 mg via ORAL
  Filled 2016-01-28 (×2): qty 1

## 2016-01-28 MED ORDER — ACETAMINOPHEN 160 MG/5ML PO SOLN: 650.0000 mg | Freq: Once | ORAL | Status: AC

## 2016-01-28 MED ORDER — ONDANSETRON HCL 4 MG/2ML IJ SOLN: 4.0000 mg | Freq: Four times a day (QID) | INTRAMUSCULAR | Status: DC | PRN

## 2016-01-28 MED ORDER — ALBUMIN HUMAN 5 % IV SOLN
250.0000 mL | INTRAVENOUS | Status: AC | PRN
  Administered 2016-01-28 (×3): 250 mL via INTRAVENOUS
  Filled 2016-01-28 (×2): qty 250

## 2016-01-28 MED ORDER — DEXTROSE 5 % IV SOLN
1.5000 g | Freq: Two times a day (BID) | INTRAVENOUS | Status: AC
  Administered 2016-01-28 – 2016-01-30 (×4): 1.5 g via INTRAVENOUS
  Filled 2016-01-28 (×4): qty 1.5

## 2016-01-28 MED ORDER — METOCLOPRAMIDE HCL 5 MG/ML IJ SOLN
10.0000 mg | Freq: Four times a day (QID) | INTRAMUSCULAR | Status: AC
  Administered 2016-01-28 – 2016-01-29 (×4): 10 mg via INTRAVENOUS
  Filled 2016-01-28 (×4): qty 2

## 2016-01-28 MED ORDER — DEXMEDETOMIDINE HCL IN NACL 200 MCG/50ML IV SOLN
0.0000 ug/kg/h | INTRAVENOUS | Status: DC
  Filled 2016-01-28: qty 50

## 2016-01-28 MED ORDER — CHLORHEXIDINE GLUCONATE 0.12 % MT SOLN
15.0000 mL | OROMUCOSAL | Status: AC
  Administered 2016-01-28: 15 mL via OROMUCOSAL

## 2016-01-28 MED ORDER — ALBUMIN HUMAN 5 % IV SOLN
INTRAVENOUS | Status: DC | PRN
  Administered 2016-01-28: 12:00:00 via INTRAVENOUS

## 2016-01-28 MED ORDER — SODIUM CHLORIDE 0.9 % IV SOLN
INTRAVENOUS | Status: DC | PRN
  Administered 2016-01-28: 5 g/h via INTRAVENOUS

## 2016-01-28 MED ORDER — BISACODYL 5 MG PO TBEC
10.0000 mg | DELAYED_RELEASE_TABLET | Freq: Every day | ORAL | Status: DC
  Administered 2016-01-29 – 2016-01-30 (×2): 10 mg via ORAL
  Filled 2016-01-28 (×2): qty 2

## 2016-01-28 MED ORDER — PROTAMINE SULFATE 10 MG/ML IV SOLN
INTRAVENOUS | Status: DC | PRN
  Administered 2016-01-28: 120 mg via INTRAVENOUS
  Administered 2016-01-28: 130 mg via INTRAVENOUS

## 2016-01-28 MED ORDER — FAMOTIDINE IN NACL 20-0.9 MG/50ML-% IV SOLN
20.0000 mg | Freq: Two times a day (BID) | INTRAVENOUS | Status: AC
  Administered 2016-01-28: 20 mg via INTRAVENOUS

## 2016-01-28 MED ORDER — PHENYLEPHRINE HCL 10 MG/ML IJ SOLN
INTRAVENOUS | Status: DC | PRN
  Administered 2016-01-28: 40 ug/min via INTRAVENOUS

## 2016-01-28 MED ORDER — MIDAZOLAM HCL 5 MG/5ML IJ SOLN
INTRAMUSCULAR | Status: DC | PRN
  Administered 2016-01-28: 1 mg via INTRAVENOUS
  Administered 2016-01-28 (×2): 2 mg via INTRAVENOUS
  Administered 2016-01-28: 3 mg via INTRAVENOUS

## 2016-01-28 MED ORDER — PHENYLEPHRINE HCL 10 MG/ML IJ SOLN
0.0000 ug/min | INTRAMUSCULAR | Status: DC
  Administered 2016-01-28: 30 ug/min via INTRAVENOUS
  Filled 2016-01-28 (×2): qty 2

## 2016-01-28 MED ORDER — CHLORHEXIDINE GLUCONATE 0.12% ORAL RINSE (MEDLINE KIT)
15.0000 mL | Freq: Two times a day (BID) | OROMUCOSAL | Status: DC
  Administered 2016-01-28 – 2016-01-29 (×2): 15 mL via OROMUCOSAL

## 2016-01-28 MED ORDER — MIDAZOLAM HCL 2 MG/2ML IJ SOLN: 2.0000 mg | INTRAMUSCULAR | Status: DC | PRN

## 2016-01-28 MED ORDER — POTASSIUM CHLORIDE 10 MEQ/50ML IV SOLN
10.0000 meq | INTRAVENOUS | Status: AC
  Administered 2016-01-28 (×3): 10 meq via INTRAVENOUS

## 2016-01-28 MED ORDER — ORAL CARE MOUTH RINSE
15.0000 mL | Freq: Four times a day (QID) | OROMUCOSAL | Status: DC
  Administered 2016-01-28 – 2016-01-29 (×2): 15 mL via OROMUCOSAL

## 2016-01-28 MED ORDER — MORPHINE SULFATE (PF) 2 MG/ML IV SOLN
2.0000 mg | INTRAVENOUS | Status: DC | PRN
  Administered 2016-01-28: 2 mg via INTRAVENOUS
  Administered 2016-01-29: 4 mg via INTRAVENOUS
  Administered 2016-01-29 – 2016-01-30 (×4): 2 mg via INTRAVENOUS
  Filled 2016-01-28 (×3): qty 1
  Filled 2016-01-28: qty 2
  Filled 2016-01-28 (×3): qty 1

## 2016-01-28 MED ORDER — BISACODYL 10 MG RE SUPP: 10.0000 mg | Freq: Every day | RECTAL | Status: DC

## 2016-01-28 MED ORDER — SODIUM CHLORIDE 0.9 % IJ SOLN
OROMUCOSAL | Status: DC | PRN
  Administered 2016-01-28: 12 mL via TOPICAL

## 2016-01-28 MED ORDER — DOCUSATE SODIUM 100 MG PO CAPS
200.0000 mg | ORAL_CAPSULE | Freq: Every day | ORAL | Status: DC
  Administered 2016-01-29 – 2016-01-30 (×2): 200 mg via ORAL
  Filled 2016-01-28 (×2): qty 2

## 2016-01-28 MED ORDER — INSULIN REGULAR BOLUS VIA INFUSION
0.0000 [IU] | Freq: Three times a day (TID) | INTRAVENOUS | Status: DC
  Administered 2016-01-29: 2 [IU] via INTRAVENOUS
  Filled 2016-01-28: qty 10

## 2016-01-28 MED ORDER — MORPHINE SULFATE (PF) 2 MG/ML IV SOLN
1.0000 mg | INTRAVENOUS | Status: AC | PRN
  Administered 2016-01-28: 2 mg via INTRAVENOUS

## 2016-01-28 MED FILL — Potassium Chloride Inj 2 mEq/ML: INTRAVENOUS | Qty: 40 | Status: AC

## 2016-01-28 MED FILL — Magnesium Sulfate Inj 50%: INTRAMUSCULAR | Qty: 10 | Status: AC

## 2016-01-28 MED FILL — Heparin Sodium (Porcine) Inj 1000 Unit/ML: INTRAMUSCULAR | Qty: 30 | Status: AC

## 2016-01-28 SURGICAL SUPPLY — 91 items
BAG DECANTER FOR FLEXI CONT (MISCELLANEOUS) ×4 IMPLANT
BANDAGE ACE 4X5 VEL STRL LF (GAUZE/BANDAGES/DRESSINGS) ×4 IMPLANT
BANDAGE ACE 6X5 VEL STRL LF (GAUZE/BANDAGES/DRESSINGS) ×4 IMPLANT
BASKET HEART (ORDER IN 25'S) (MISCELLANEOUS) ×1
BASKET HEART (ORDER IN 25S) (MISCELLANEOUS) ×3 IMPLANT
BENZOIN TINCTURE PRP APPL 2/3 (GAUZE/BANDAGES/DRESSINGS) ×4 IMPLANT
BLADE STERNUM SYSTEM 6 (BLADE) ×4 IMPLANT
BNDG GAUZE ELAST 4 BULKY (GAUZE/BANDAGES/DRESSINGS) ×4 IMPLANT
CANISTER SUCTION 2500CC (MISCELLANEOUS) ×4 IMPLANT
CANNULA EZ GLIDE AORTIC 21FR (CANNULA) ×4 IMPLANT
CATH CPB KIT HENDRICKSON (MISCELLANEOUS) ×4 IMPLANT
CATH FOLEY LATEX FREE 16FR (CATHETERS) ×1
CATH FOLEY LF 16FR (CATHETERS) ×3 IMPLANT
CATH ROBINSON RED A/P 18FR (CATHETERS) IMPLANT
CATH THORACIC 36FR (CATHETERS) ×4 IMPLANT
CATH THORACIC 36FR RT ANG (CATHETERS) ×4 IMPLANT
CLIP TI MEDIUM 24 (CLIP) ×4 IMPLANT
CLIP TI WIDE RED SMALL 24 (CLIP) ×8 IMPLANT
CRADLE DONUT ADULT HEAD (MISCELLANEOUS) ×4 IMPLANT
DERMABOND ADVANCED (GAUZE/BANDAGES/DRESSINGS) ×1
DERMABOND ADVANCED .7 DNX12 (GAUZE/BANDAGES/DRESSINGS) ×3 IMPLANT
DRAPE CARDIOVASCULAR INCISE (DRAPES) ×1
DRAPE SLUSH/WARMER DISC (DRAPES) ×4 IMPLANT
DRAPE SRG 135X102X78XABS (DRAPES) ×3 IMPLANT
DRSG AQUACEL AG ADV 3.5X14 (GAUZE/BANDAGES/DRESSINGS) ×4 IMPLANT
DRSG COVADERM 4X14 (GAUZE/BANDAGES/DRESSINGS) IMPLANT
ELECT REM PT RETURN 9FT ADLT (ELECTROSURGICAL) ×8
ELECTRODE REM PT RTRN 9FT ADLT (ELECTROSURGICAL) ×6 IMPLANT
FELT TEFLON 1X6 (MISCELLANEOUS) ×8 IMPLANT
GAUZE SPONGE 4X4 12PLY STRL (GAUZE/BANDAGES/DRESSINGS) ×8 IMPLANT
GLOVE BIOGEL PI IND STRL 6 (GLOVE) ×6 IMPLANT
GLOVE BIOGEL PI IND STRL 6.5 (GLOVE) ×12 IMPLANT
GLOVE BIOGEL PI INDICATOR 6 (GLOVE) ×2
GLOVE BIOGEL PI INDICATOR 6.5 (GLOVE) ×4
GLOVE SS N UNI LF 7.5 STRL (GLOVE) ×8 IMPLANT
GLOVE SURG SIGNA 7.5 PF LTX (GLOVE) IMPLANT
GOWN STRL REUS W/ TWL LRG LVL3 (GOWN DISPOSABLE) ×18 IMPLANT
GOWN STRL REUS W/ TWL XL LVL3 (GOWN DISPOSABLE) ×6 IMPLANT
GOWN STRL REUS W/TWL LRG LVL3 (GOWN DISPOSABLE) ×6
GOWN STRL REUS W/TWL XL LVL3 (GOWN DISPOSABLE) ×2
HEMOSTAT POWDER SURGIFOAM 1G (HEMOSTASIS) ×12 IMPLANT
HEMOSTAT SURGICEL 2X14 (HEMOSTASIS) ×4 IMPLANT
INSERT FOGARTY XLG (MISCELLANEOUS) ×4 IMPLANT
KIT BASIN OR (CUSTOM PROCEDURE TRAY) ×4 IMPLANT
KIT ROOM TURNOVER OR (KITS) ×4 IMPLANT
KIT SUCTION CATH 14FR (SUCTIONS) ×12 IMPLANT
KIT VASOVIEW HEMOPRO VH 3000 (KITS) ×4 IMPLANT
MARKER GRAFT CORONARY BYPASS (MISCELLANEOUS) ×12 IMPLANT
NS IRRIG 1000ML POUR BTL (IV SOLUTION) ×20 IMPLANT
PACK OPEN HEART (CUSTOM PROCEDURE TRAY) ×4 IMPLANT
PAD ARMBOARD 7.5X6 YLW CONV (MISCELLANEOUS) ×8 IMPLANT
PAD ELECT DEFIB RADIOL ZOLL (MISCELLANEOUS) ×4 IMPLANT
PENCIL BUTTON HOLSTER BLD 10FT (ELECTRODE) ×8 IMPLANT
PUNCH AORTIC ROTATE 4.0MM (MISCELLANEOUS) ×4 IMPLANT
PUNCH AORTIC ROTATE 4.5MM 8IN (MISCELLANEOUS) IMPLANT
PUNCH AORTIC ROTATE 5MM 8IN (MISCELLANEOUS) IMPLANT
SET CARDIOPLEGIA MPS 5001102 (MISCELLANEOUS) ×4 IMPLANT
SPONGE GAUZE 4X4 12PLY STER LF (GAUZE/BANDAGES/DRESSINGS) ×12 IMPLANT
SUT BONE WAX W31G (SUTURE) ×8 IMPLANT
SUT MNCRL AB 4-0 PS2 18 (SUTURE) ×4 IMPLANT
SUT PROLENE 3 0 SH DA (SUTURE) ×4 IMPLANT
SUT PROLENE 4 0 RB 1 (SUTURE) ×2
SUT PROLENE 4 0 SH DA (SUTURE) IMPLANT
SUT PROLENE 4-0 RB1 .5 CRCL 36 (SUTURE) ×6 IMPLANT
SUT PROLENE 6 0 C 1 30 (SUTURE) ×12 IMPLANT
SUT PROLENE 7 0 BV1 MDA (SUTURE) ×8 IMPLANT
SUT PROLENE 8 0 BV175 6 (SUTURE) IMPLANT
SUT STEEL 6MS V (SUTURE) ×4 IMPLANT
SUT STEEL STERNAL CCS#1 18IN (SUTURE) ×4 IMPLANT
SUT STEEL SZ 6 DBL 3X14 BALL (SUTURE) ×4 IMPLANT
SUT VIC AB 1 CTX 27 (SUTURE) ×8 IMPLANT
SUT VIC AB 1 CTX 36 (SUTURE) ×2
SUT VIC AB 1 CTX36XBRD ANBCTR (SUTURE) ×6 IMPLANT
SUT VIC AB 2-0 CT1 27 (SUTURE)
SUT VIC AB 2-0 CT1 TAPERPNT 27 (SUTURE) IMPLANT
SUT VIC AB 2-0 CTX 27 (SUTURE) IMPLANT
SUT VIC AB 3-0 SH 27 (SUTURE)
SUT VIC AB 3-0 SH 27X BRD (SUTURE) IMPLANT
SUT VIC AB 3-0 X1 27 (SUTURE) IMPLANT
SUT VICRYL 4-0 PS2 18IN ABS (SUTURE) IMPLANT
SUTURE E-PAK OPEN HEART (SUTURE) ×4 IMPLANT
SYSTEM SAHARA CHEST DRAIN ATS (WOUND CARE) ×4 IMPLANT
TAPE CLOTH SURG 4X10 WHT LF (GAUZE/BANDAGES/DRESSINGS) ×8 IMPLANT
TAPE PAPER 3X10 WHT MICROPORE (GAUZE/BANDAGES/DRESSINGS) ×4 IMPLANT
TOWEL OR 17X24 6PK STRL BLUE (TOWEL DISPOSABLE) ×8 IMPLANT
TOWEL OR 17X26 10 PK STRL BLUE (TOWEL DISPOSABLE) ×8 IMPLANT
TRAY FOLEY IC TEMP SENS 16FR (CATHETERS) IMPLANT
TUBE FEEDING 8FR 16IN STR KANG (MISCELLANEOUS) ×4 IMPLANT
TUBING INSUFFLATION (TUBING) ×4 IMPLANT
UNDERPAD 30X30 (UNDERPADS AND DIAPERS) ×4 IMPLANT
WATER STERILE IRR 1000ML POUR (IV SOLUTION) ×8 IMPLANT

## 2016-01-28 NOTE — Anesthesia Procedure Notes (Signed)
Central Venous Catheter Insertion Performed by: anesthesiologist Patient location: Pre-op. Preanesthetic checklist: patient identified, IV checked, site marked, risks and benefits discussed, surgical consent, monitors and equipment checked, pre-op evaluation, timeout performed and anesthesia consent Lidocaine 1% used for infiltration Landmarks identified Catheter size: 8.5 Fr Sheath introducer Procedure performed using ultrasound guided technique. Attempts: 1 Following insertion, line sutured and dressing applied. Post procedure assessment: blood return through all ports, free fluid flow and no air. Patient tolerated the procedure well with no immediate complications.

## 2016-01-28 NOTE — Transfer of Care (Signed)
Immediate Anesthesia Transfer of Care Note  Patient: [name]  Procedure(s) Performed: Procedure(s): CORONARY ARTERY BYPASS GRAFTING (CABG) x 3 USING RIGHT LEG GREATER SAPHENOUS VEIN GRAFT (N/A) TRANSESOPHAGEAL ECHOCARDIOGRAM (TEE) (N/A) ENDOVEIN HARVEST OF GREATER SAPHENOUS VEIN (Right)  Patient Location: ICU  Anesthesia Type:General  Level of Consciousness: unresponsive and Patient remains intubated per anesthesia plan  Airway & Oxygen Therapy: Patient remains intubated per anesthesia plan and Patient placed on Ventilator (see vital sign flow sheet for setting)  Post-op Assessment: Report given to RN, Post -op Vital signs reviewed and stable and Patient moving all extremities  Post vital signs: Reviewed and stable  Last Vitals:  Vitals:   01/28/16 0018 01/28/16 0452  BP: 117/66 123/66  Pulse: 87 84  Resp: 20 19  Temp: 36.6 C 36.6 C    Last Pain:  Vitals:   01/28/16 0452  TempSrc: Oral  PainSc:          Complications: No apparent anesthesia complications

## 2016-01-28 NOTE — Progress Notes (Signed)
[facility] BRIEF SICU PROGRESS NOTE  Day of Surgery  S/P Procedure(s) (LRB): CORONARY ARTERY BYPASS GRAFTING (CABG) x 3 USING RIGHT LEG GREATER SAPHENOUS VEIN GRAFT (N/A) TRANSESOPHAGEAL ECHOCARDIOGRAM (TEE) (N/A) ENDOVEIN HARVEST OF GREATER SAPHENOUS VEIN (Right)   Waking up on vent AAI paced w/ stable hemodynamics on low dose Neo drip O2 sats 98% on 40% FiO2 Chest tube output low UOP 50 mL/hr Labs okay  Plan: Continue routine early postop  Rexene Alberts, MD 01/28/2016 6:02 PM

## 2016-01-28 NOTE — Anesthesia Preprocedure Evaluation (Signed)
Anesthesia Evaluation  Patient identified by MRN, date of birth, ID band Patient awake    Reviewed: Allergy & Precautions, H&P , NPO status , Patient's Chart, lab work & pertinent test results  Airway Mallampati: II   Neck ROM: full    Dental   Pulmonary neg pulmonary ROS,    breath sounds clear to auscultation       Cardiovascular hypertension, + CAD and + Past MI   Rhythm:regular Rate:Normal     Neuro/Psych    GI/Hepatic GERD  ,  Endo/Other  diabetes, Type 2  Renal/GU      Musculoskeletal  (+) Arthritis ,   Abdominal   Peds  Hematology   Anesthesia Other Findings   Reproductive/Obstetrics                             Anesthesia Physical Anesthesia Plan  ASA: III  Anesthesia Plan: General   Post-op Pain Management:    Induction: Intravenous  Airway Management Planned: Oral ETT  Additional Equipment:   Intra-op Plan:   Post-operative Plan: Post-operative intubation/ventilation  Informed Consent: I have reviewed the patients History and Physical, chart, labs and discussed the procedure including the risks, benefits and alternatives for the proposed anesthesia with the patient or authorized representative who has indicated his/her understanding and acceptance.     Plan Discussed with: CRNA, Anesthesiologist and Surgeon  Anesthesia Plan Comments:         Anesthesia Quick Evaluation

## 2016-01-28 NOTE — Plan of Care (Signed)
Patient to OR 7am for CABG, care taken over by [facility].  Imogene Burn K M.D on 01/28/2016 at 1:07 PM  Between 7am to 7pm - Pager - [phone], After 7pm go to [email or web address] - password [facility]  [facility]  - Office  [phone]

## 2016-01-28 NOTE — Interval H&P Note (Signed)
History and Physical Interval Note:  01/28/2016 7:37 AM  [name]  has presented today for surgery, with the diagnosis of CAD  The various methods of treatment have been discussed with the patient and family. After consideration of risks, benefits and other options for treatment, the patient has consented to  Procedure(s): CORONARY ARTERY BYPASS GRAFTING (CABG) (N/A) TRANSESOPHAGEAL ECHOCARDIOGRAM (TEE) (N/A) as a surgical intervention .  The patient's history has been reviewed, patient examined, no change in status, stable for surgery.  I have reviewed the patient's chart and labs.  Questions were answered to the patient's satisfaction.     Melrose Nakayama

## 2016-01-28 NOTE — Anesthesia Postprocedure Evaluation (Addendum)
Anesthesia Post Note  Patient: [name]  Procedure(s) Performed: Procedure(s) (LRB): CORONARY ARTERY BYPASS GRAFTING (CABG) x 3 USING RIGHT LEG GREATER SAPHENOUS VEIN GRAFT (N/A) TRANSESOPHAGEAL ECHOCARDIOGRAM (TEE) (N/A) ENDOVEIN HARVEST OF GREATER SAPHENOUS VEIN (Right)  Patient location during evaluation: ICU Anesthesia Type: General Level of consciousness: patient remains intubated per anesthesia plan and sedated Pain management: pain level controlled Vital Signs Assessment: post-procedure vital signs reviewed and stable Respiratory status: patient on ventilator - see flowsheet for VS and patient remains intubated per anesthesia plan Cardiovascular status: stable Postop Assessment: no signs of nausea or vomiting Anesthetic complications: no    Last Vitals:  Vitals:   01/28/16 1800 01/28/16 1840  BP:    Pulse: 89 88  Resp: (!) 22 (!) 26  Temp: (!) 38.1 C (!) 38 C    Last Pain:  Vitals:   01/28/16 0452  TempSrc: Oral  PainSc:                  CREWS,DAVID A

## 2016-01-28 NOTE — Anesthesia Procedure Notes (Signed)
Central Venous Catheter Insertion Performed by: anesthesiologist Patient location: Pre-op. Preanesthetic checklist: patient identified, IV checked, site marked, risks and benefits discussed, surgical consent, monitors and equipment checked, pre-op evaluation, timeout performed and anesthesia consent Landmarks identified PA cath was placed.Swan type and PA catheter depth:thermodilationProcedure performed without using ultrasound guided technique. Attempts: 1 Patient tolerated the procedure well with no immediate complications.

## 2016-01-28 NOTE — OR Nursing (Signed)
2nd call SICU @1235 

## 2016-01-28 NOTE — Progress Notes (Signed)
  Echocardiogram Echocardiogram Transesophageal has been performed.  Darlina Sicilian M 01/28/2016, 10:13 AM

## 2016-01-28 NOTE — H&P (View-Only) (Signed)
Reason for Consult:3 vessel CAD, Non STEMI Referring Physician: Dr. Toula Moos is an 71 y.o. female.  HPI: 70 yo woman with no prior cardiac history but multiple CRF who presents with a cc/o CP.  Mrs. Proffit has a PMH significant for insulin dependent type II diabetes, hypertension, dyslipidemia, and a family history of CAD. She had sudden onset of substernal chest pain radiating to both arms last night around 10 PM. She initially took an aspirin and the pain improved for a short time. When the pain worsened again she took one of her husband's nitroglycerin pills and the pain resolved. She then knew it was her heart and came to the ED. Her ECG showed ST depression. Her initial troponin was negative but she did later rule in for a non STEMI. She continued to have unstable CP and frequent PVCs and was taken to cath where she was found to have severe 3 vessel CAD. She is currently pain free.  In retrospect she says she has had to limit her walking due to pain in her arms since her back surgery.   Past Medical History:  Diagnosis Date  . Allergy   . Arthritis    neck  . Cataract    bilateral - MD monitoring cataracts  . Chronic kidney disease, stage I    DR OTTELIN  HX UTIS  . Cramp of limb   . Diabetes mellitus   . Dysphagia, unspecified(787.20)   . Dysuria   . Epistaxis   . GERD (gastroesophageal reflux disease)   . Heart murmur    NO CARDIOLOGIST  DX FOR YEARS ASYMPTOMATIC  . Lumbago   . Neoplasm of uncertain behavior of skin   . Nonspecific elevation of levels of transaminase or lactic acid dehydrogenase (LDH)   . Osteoarthrosis, unspecified whether generalized or localized, unspecified site   . Other and unspecified hyperlipidemia    diet controlled  . Pain in joint, shoulder region   . Paresthesias 04/01/2015  . Postablative ovarian failure   . Trochanteric bursitis of left hip 12/15/2015  . Type 2 diabetes mellitus without complication ([place])   . Unspecified  essential hypertension    no meds    Past Surgical History:  Procedure Laterality Date  . CARDIAC CATHETERIZATION N/A 01/27/2016   Procedure: Left Heart Cath and Coronary Angiography;  Surgeon: Belva Crome, MD;  Location: [facility];  Service: Cardiovascular;  Laterality: N/A;  . COLONOSCOPY  2012   [facility] - polyp  . MAXIMUM ACCESS (MAS)POSTERIOR LUMBAR INTERBODY FUSION (PLIF) 1 LEVEL Left 06/10/2015   Procedure: FOR MAXIMUM ACCESS (MAS) POSTERIOR LUMBAR INTERBODY FUSION (PLIF) LUMBAR THREE-FOUR EXTRAFORAMINAL MICRODISCECTOMY LUMBAR FIVE-SACRAL ONE LEFT;  Surgeon: Eustace Moore, MD;  Location: [facility];  Service: Neurosurgery;  Laterality: Left;  . TUBAL LIGATION  1982   Dr Connye Burkitt  . UPPER GASTROINTESTINAL ENDOSCOPY    . VAGINAL HYSTERECTOMY  1997   Dr Rande Lawman    Family History  Problem Relation Age of Onset  . Cancer Father   . Arthritis Sister   . Arthritis Brother   . Heart disease Maternal Grandmother   . Heart disease Maternal Grandfather   . Heart disease Paternal Grandmother   . Heart disease Paternal Grandfather   . Liver cancer Brother   . Colon cancer Neg Hx   . Esophageal cancer Neg Hx   . Rectal cancer Neg Hx   . Stomach cancer Neg Hx     Social History:  reports that she has never smoked. She has never used smokeless tobacco. She reports that she does not drink alcohol or use drugs.  Allergies:  Allergies  Allergen Reactions  . Kiwi Extract Anaphylaxis  . Tdap [Diphth-Acell Pertussis-Tetanus] Swelling    Swelling at injection site, gets very hot  . Tramadol Nausea And Vomiting  . Latex Itching, Dermatitis and Rash    Medications:  Prior to Admission:  Prescriptions Prior to Admission  Medication Sig Dispense Refill Last Dose  . AMBULATORY NON FORMULARY MEDICATION One Touch Ultra Blue Test Strips Sig: Use to test blood sugar three times daily to control blood sugar Dx: E11.9 100 each 11 01/26/2016 at Unknown time  . Biotin 10000 MCG TABS Take  10,000 mcg by mouth every morning.   01/26/2016 at Unknown time  . calcium carbonate (OS-CAL) 600 MG TABS Take 600 mg by mouth 2 (two) times daily with a meal.     01/26/2016 at Unknown time  . cholecalciferol (VITAMIN D) 1000 UNITS tablet Take 1,000 Units by mouth daily.     01/26/2016 at Unknown time  . ciprofloxacin (CIPRO) 250 MG tablet Take 250 mg by mouth 2 (two) times daily as needed (to prevent UTIs). Take one tablet after intercourse to prevent urinary tract infection   6 weeks  . Cyanocobalamin (VITAMIN B 12 PO) Take 1,000 mcg by mouth daily.     01/26/2016 at Unknown time  . fish oil-omega-3 fatty acids 1000 MG capsule Take 1 g by mouth daily.     01/26/2016 at Unknown time  . fluticasone (FLONASE) 50 MCG/ACT nasal spray Place 2 sprays into both nostrils 2 (two) times daily. Decrease to 2 sprays/nostril daily after 5 days (Patient taking differently: Place 2 sprays into both nostrils daily as needed for allergies. ) 16 g 2 unk  . Glucosamine 500 MG CAPS Take 2,000 mg by mouth 2 (two) times daily.   01/26/2016 at Unknown time  . glucose blood test strip Use as instructed to test blood sugar three times daily  Dx  250.00 100 each 12 01/26/2016 at Unknown time  . insulin detemir (LEVEMIR) 100 UNIT/ML injection Inject 45 Units into the skin at bedtime.    01/26/2016 at Unknown time  . Insulin Syringe-Needle U-100 (INSULIN SYRINGE .5CC/31GX5/16") 31G X 5/16" 0.5 ML MISC Use daily with the administration of insulin E11.9 100 each 11 01/26/2016 at Unknown time  . MAGNESIUM PO Take 500 mg by mouth 2 (two) times daily in the am and at bedtime..   01/26/2016 at Unknown time  . meloxicam (MOBIC) 15 MG tablet One daily to help arthritis and bursitis (Patient taking differently: Take 7.5 mg by mouth 2 (two) times daily. One daily to help arthritis and bursitis) 30 tablet 5 01/26/2016 at Unknown time  . metFORMIN (GLUCOPHAGE) 1000 MG tablet Take one tablet by mouth twice daily to treat diabetes 180 tablet 3 01/26/2016  at Unknown time  . Multiple Vitamins-Minerals (MULTIVITAMIN WITH MINERALS) tablet Take 1 tablet by mouth daily.     01/26/2016 at Unknown time  . omeprazole (PRILOSEC) 20 MG capsule TAKE ONE CAPSULE BY MOUTH DAILY 90 capsule 0 01/26/2016 at Unknown time  . Polyethyl Glycol-Propyl Glycol (SYSTANE OP) Place 1 drop into both eyes 2 (two) times daily.   01/26/2016 at Unknown time  . Probiotic Product (PROBIOTIC DAILY PO) Take 1 capsule by mouth daily. Digestive Advantage Probiotic   01/26/2016 at Unknown time  . Turmeric 500 MG CAPS Take 2 capsules by mouth  2 (two) times daily.   01/26/2016 at Unknown time    Results for orders placed or performed during the hospital encounter of 01/27/16 (from the past 48 hour(s))  Basic metabolic panel     Status: Abnormal   Collection Time: 01/26/16 11:49 PM  Result Value Ref Range   Sodium 135 135 - 145 mmol/L   Potassium 3.8 3.5 - 5.1 mmol/L   Chloride 99 (L) 101 - 111 mmol/L   CO2 20 (L) 22 - 32 mmol/L   Glucose, Bld 429 (H) 65 - 99 mg/dL   BUN 11 6 - 20 mg/dL   Creatinine, Ser 0.82 0.44 - 1.00 mg/dL   Calcium 9.7 8.9 - 10.3 mg/dL   GFR calc non Af Amer >60 >60 mL/min   GFR calc Af Amer >60 >60 mL/min    Comment: (NOTE) The eGFR has been calculated using the CKD EPI equation. This calculation has not been validated in all clinical situations. eGFR's persistently <60 mL/min signify possible Chronic Kidney Disease.    Anion gap 16 (H) 5 - 15  CBC     Status: Abnormal   Collection Time: 01/26/16 11:49 PM  Result Value Ref Range   WBC 5.6 4.0 - 10.5 K/uL   RBC 4.20 3.87 - 5.11 MIL/uL   Hemoglobin 11.1 (L) 12.0 - 15.0 g/dL   HCT 35.3 (L) 36.0 - 46.0 %   MCV 84.0 78.0 - 100.0 fL   MCH 26.4 26.0 - 34.0 pg   MCHC 31.4 30.0 - 36.0 g/dL   RDW 15.5 11.5 - 15.5 %   Platelets 156 150 - 400 K/uL  I-stat troponin, ED     Status: None   Collection Time: 01/26/16 11:59 PM  Result Value Ref Range   Troponin i, poc 0.00 0.00 - 0.08 ng/mL   Comment 3             Comment: Due to the release kinetics of cTnI, a negative result within the first hours of the onset of symptoms does not rule out myocardial infarction with certainty. If myocardial infarction is still suspected, repeat the test at appropriate intervals.   I-stat troponin, ED     Status: Abnormal   Collection Time: 01/27/16  4:21 AM  Result Value Ref Range   Troponin i, poc 0.14 (HH) 0.00 - 0.08 ng/mL   Comment NOTIFIED PHYSICIAN    Comment 3            Comment: Due to the release kinetics of cTnI, a negative result within the first hours of the onset of symptoms does not rule out myocardial infarction with certainty. If myocardial infarction is still suspected, repeat the test at appropriate intervals.   CBG monitoring, ED     Status: Abnormal   Collection Time: 01/27/16  5:11 AM  Result Value Ref Range   Glucose-Capillary 243 (H) 65 - 99 mg/dL  MRSA PCR Screening     Status: None   Collection Time: 01/27/16  7:06 AM  Result Value Ref Range   MRSA by PCR NEGATIVE NEGATIVE    Comment:        The GeneXpert MRSA Assay (FDA approved for NASAL specimens only), is one component of a comprehensive MRSA colonization surveillance program. It is not intended to diagnose MRSA infection nor to guide or monitor treatment for MRSA infections.   Glucose, capillary     Status: Abnormal   Collection Time: 01/27/16  7:34 AM  Result Value Ref Range   Glucose-Capillary 206 (  H) 65 - 99 mg/dL  Troponin I (q 6hr x 3)     Status: Abnormal   Collection Time: 01/27/16  7:51 AM  Result Value Ref Range   Troponin I 0.61 (HH) <0.03 ng/mL    Comment: CRITICAL RESULT CALLED TO, READ BACK BY AND VERIFIED WITH: H.MCGRATH,RN [id number] 01/27/16 CLARK,S   Heparin level (unfractionated)     Status: None   Collection Time: 01/27/16  7:51 AM  Result Value Ref Range   Heparin Unfractionated 0.45 0.30 - 0.70 IU/mL    Comment:        IF HEPARIN RESULTS ARE BELOW EXPECTED VALUES, AND PATIENT DOSAGE HAS  BEEN CONFIRMED, SUGGEST FOLLOW UP TESTING OF ANTITHROMBIN III LEVELS.   Protime-INR     Status: Abnormal   Collection Time: 01/27/16  7:51 AM  Result Value Ref Range   Prothrombin Time 15.5 (H) 11.4 - 15.2 seconds   INR 1.23   Glucose, capillary     Status: Abnormal   Collection Time: 01/27/16 11:07 AM  Result Value Ref Range   Glucose-Capillary 189 (H) 65 - 99 mg/dL  Troponin I (q 6hr x 3)     Status: Abnormal   Collection Time: 01/27/16 11:45 AM  Result Value Ref Range   Troponin I 2.19 (HH) <0.03 ng/mL    Comment: CRITICAL VALUE NOTED.  VALUE IS CONSISTENT WITH PREVIOUSLY REPORTED AND CALLED VALUE.  Glucose, capillary     Status: Abnormal   Collection Time: 01/27/16  4:16 PM  Result Value Ref Range   Glucose-Capillary 166 (H) 65 - 99 mg/dL    Dg Chest 2 View  Result Date: 01/27/2016 CLINICAL DATA:  Generalized chest pressure since 20/2 100 hours tonight. Bilateral arm pain. History of diabetes. EXAM: CHEST  2 VIEW COMPARISON:  06/02/2015 FINDINGS: Mild hyperinflation. Mild diffuse interstitial changes in the lungs with peribronchial thickening suggesting chronic bronchitis. No focal airspace disease or consolidation in the lungs. No blunting of costophrenic angles. No pneumothorax. Normal heart size and pulmonary vascularity. Calcification of the aorta. Degenerative changes in the spine and shoulders IMPRESSION: Chronic bronchitic changes. No evidence of active pulmonary disease. Electronically Signed   By: Lucienne Capers M.D.   On: 01/27/2016 00:19    Review of Systems  Constitutional: Positive for malaise/fatigue. Negative for chills and fever.  Eyes: Negative for blurred vision and double vision.  Respiratory: Negative for cough and shortness of breath.   Cardiovascular: Positive for chest pain. Negative for palpitations, claudication and leg swelling.  Gastrointestinal: Negative for abdominal pain and blood in stool.  Genitourinary: Negative for dysuria and urgency.   Musculoskeletal: Positive for back pain and joint pain.  Neurological: Negative for focal weakness, seizures and loss of consciousness.  Endo/Heme/Allergies: Negative for polydipsia. Does not bruise/bleed easily.  All other systems reviewed and are negative.  Blood pressure (!) 110/55, pulse 88, temperature 97.9 F (36.6 C), temperature source Oral, resp. rate 15, height _0  (1.575 m), weight 155 lb 8 oz (70.5 kg), SpO2 95 %. Physical Exam  Vitals reviewed. Constitutional: She is oriented to person, place, and time. She appears well-developed and well-nourished. No distress.  HENT:  Head: Normocephalic and atraumatic.  Eyes: Conjunctivae and EOM are normal. Pupils are equal, round, and reactive to light. No scleral icterus.  Neck: Normal range of motion. Neck supple. No JVD present. No thyromegaly present.  Cardiovascular: Normal rate, regular rhythm and intact distal pulses.  Exam reveals no gallop and no friction rub.   Murmur (2/6 systolic)  heard. Respiratory: Effort normal and breath sounds normal. No respiratory distress. She has no wheezes. She has no rales.  GI: Soft. She exhibits no distension. There is no tenderness.  Musculoskeletal: She exhibits no edema.  Lymphadenopathy:    She has no cervical adenopathy.  Neurological: She is alert and oriented to person, place, and time. No cranial nerve deficit.  Motor grossly intact   Skin: Skin is warm and dry.   CARDIAC CATHETERIZATION Conclusion     Prox RCA lesion, 75 %stenosed.  Ramus lesion, 95 %stenosed.  Prox LAD to Mid LAD lesion, 90 %stenosed.  Mid RCA to Dist RCA lesion, 100 %stenosed.  Dist RCA lesion, 100 %stenosed.  The left ventricular ejection fraction is 45-50% by visual estimate.  The left ventricular systolic function is normal.  LV end diastolic pressure is mildly elevated.    Acute coronary syndrome with recent total occlusion of the dominant RCA, high-grade obstruction in the proximal LAD  which Collateralizes the RCA, and high-grade obstruction and a moderate size ramus intermedius branch.  Left ventricular systolic dysfunction with inferobasal moderate hypokinesis and EF of 45-50%. Upper normal LV EDP.   RECOMMENDATIONS:  Long discussion with the patient concerning treatment options which include multivessel PCI / stenting vs coronary bypass grafting with LIMA to the LAD. If PCI, RCA will require near full metal jacket. After discussion, the patient would like the approach that will give her the most durable long-term success.  IV nitroglycerin, IV fluid, high intensity statin therapy, aspirin therapy, and beta blocker therapy as tolerated.  Expedited CABG because of presentation and critical nature of disease.    I personally reviewed the cath images and concur with Dr. Thompson Caul assessment  Assessment/Plan: 70 yo woman with multiple CRF who presents with an unstable coronary syndrome and ruled for a NSTEMI. At cath she has severe 3 vessel CAD with moderately impaired LV function. CABG is indicated for survival benefit and relief of symptoms.  I discussed the general nature of the procedure, the need for general anesthesia, the use of cardiopulmonary bypass, and the incisions to be used with Mrs. Lamphear and her family. We discussed the expected hospital stay, overall recovery and short and long term outcomes. I informed her of the indications, risks, benefits and alternatives. She understands the risks include, but are not limited to death, stroke, MI, DVT/PE, bleeding, possible need for transfusion, infections, cardiac arrhythmias, as well as other organ system dysfunction including respiratory, renal, or GI complications. She accepts the risks and agrees to proceed.  For CABG tomorrow AM  Melrose Nakayama 01/27/2016, 4:56 PM

## 2016-01-28 NOTE — Anesthesia Procedure Notes (Signed)
Procedure Name: Intubation Date/Time: 01/28/2016 8:05 AM Performed by: Melina Copa, Idell Hissong R Pre-anesthesia Checklist: Patient identified, Emergency Drugs available, Suction available and Patient being monitored Patient Re-evaluated:Patient Re-evaluated prior to inductionOxygen Delivery Method: Circle System Utilized Preoxygenation: Pre-oxygenation with 100% oxygen Intubation Type: IV induction Ventilation: Mask ventilation without difficulty Laryngoscope Size: Mac and 3 Grade View: Grade I Tube type: Oral Tube size: 8.0 mm Number of attempts: 1 Airway Equipment and Method: Stylet Placement Confirmation: ETT inserted through vocal cords under direct vision,  positive ETCO2 and breath sounds checked- equal and bilateral Secured at: 21 cm Tube secured with: Tape Dental Injury: Teeth and Oropharynx as per pre-operative assessment

## 2016-01-28 NOTE — Procedures (Signed)
Extubation Procedure Note  Patient Details:   Name: [name] DOB: Nov 10, 1945 MRN: [id number]   Airway Documentation:  Airway 8 mm (Active)  Secured at (cm) 21 cm 01/28/2016  5:59 PM  Measured From Lips 01/28/2016  5:59 PM  Secured Location Right 01/28/2016  5:59 PM  Secured By Rana Snare Tape 01/28/2016  1:00 PM    Evaluation  O2 sats: stable throughout Complications: No apparent complications Patient did tolerate procedure well. Bilateral Breath Sounds: Clear   Yes  Patient had a -22 NIF and a VC of 1.0 pre extubation. Extubated patient to 4L nasal canula. Patient oriented to time and place. [name] 01/28/2016, 6:44 PM

## 2016-01-28 NOTE — Brief Op Note (Addendum)
01/27/2016 - 01/28/2016  11:17 AM  PATIENT:  [name]  70 y.o. female  PRE-OPERATIVE DIAGNOSIS:  Coronary artery disease  POST-OPERATIVE DIAGNOSIS:  Coronary artery disease  PROCEDURE:  Procedure(s):  CORONARY ARTERY BYPASS GRAFTING (CABG) x 3  -LIMA to LAD -SVG to RAMUS INTERMEDIATE -SVG to DISTAL RCA  ENDOVEIN HARVEST OF GREATER SAPHENOUS VEIN  -Right Leg  TRANSESOPHAGEAL ECHOCARDIOGRAM (TEE) (N/A)   SURGEON:  Surgeon(s) and Role:    * Melrose Nakayama, MD - Primary  PHYSICIAN ASSISTANT: Ellwood Handler PA-C  ANESTHESIA:   general  EBL:  Total I/O In: -  Out: 300 [Urine:300]  BLOOD ADMINISTERED: CELLSAVER  DRAINS: Left Pleural chest tubes, mediastinal chest drains   LOCAL MEDICATIONS USED:  NONE  SPECIMEN:  No Specimen  DISPOSITION OF SPECIMEN:  N/A  COUNTS:  YES  PLAN OF CARE: Admit to inpatient   PATIENT DISPOSITION:  ICU - intubated and hemodynamically stable.   Delay start of Pharmacological VTE agent (>24hrs) due to surgical blood loss or risk of bleeding: yes

## 2016-01-29 ENCOUNTER — Encounter (HOSPITAL_COMMUNITY): Payer: Self-pay | Admitting: Thoracic Surgery (Cardiothoracic Vascular Surgery)

## 2016-01-29 ENCOUNTER — Inpatient Hospital Stay (HOSPITAL_COMMUNITY): Payer: Medicare Other

## 2016-01-29 DIAGNOSIS — I214 Non-ST elevation (NSTEMI) myocardial infarction: Principal | ICD-10-CM

## 2016-01-29 LAB — VAS US DOPPLER PRE CABG
LCCADSYS: 94 cm/s
LEFT ECA DIAS: -11 cm/s
LEFT VERTEBRAL DIAS: 11 cm/s
LICAPDIAS: -13 cm/s
Left CCA dist dias: 17 cm/s
Left CCA prox dias: 18 cm/s
Left CCA prox sys: 87 cm/s
Left ICA dist dias: -26 cm/s
Left ICA dist sys: -96 cm/s
Left ICA prox sys: -76 cm/s
RCCAPDIAS: 14 cm/s
RCCAPSYS: 88 cm/s
RIGHT ECA DIAS: -10 cm/s
RIGHT VERTEBRAL DIAS: 7 cm/s
Right cca dist sys: -62 cm/s

## 2016-01-29 LAB — BASIC METABOLIC PANEL
ANION GAP: 7 (ref 5–15)
BUN: 9 mg/dL (ref 6–20)
CALCIUM: 7.6 mg/dL — AB (ref 8.9–10.3)
CO2: 21 mmol/L — AB (ref 22–32)
Chloride: 106 mmol/L (ref 101–111)
Creatinine, Ser: 0.56 mg/dL (ref 0.44–1.00)
GFR calc non Af Amer: >60 mL/min (ref >60)
Glucose, Bld: 116 mg/dL — ABNORMAL HIGH (ref 65–99)
Potassium: 3.6 mmol/L (ref 3.5–5.1)
Sodium: 134 mmol/L — ABNORMAL LOW (ref 135–145)

## 2016-01-29 LAB — GLUCOSE, CAPILLARY
GLUCOSE-CAPILLARY: 100 mg/dL — AB (ref 65–99)
GLUCOSE-CAPILLARY: 104 mg/dL — AB (ref 65–99)
GLUCOSE-CAPILLARY: 105 mg/dL — AB (ref 65–99)
GLUCOSE-CAPILLARY: 107 mg/dL — AB (ref 65–99)
GLUCOSE-CAPILLARY: 115 mg/dL — AB (ref 65–99)
GLUCOSE-CAPILLARY: 122 mg/dL — AB (ref 65–99)
GLUCOSE-CAPILLARY: 84 mg/dL (ref 65–99)
GLUCOSE-CAPILLARY: 99 mg/dL (ref 65–99)
Glucose-Capillary: 108 mg/dL — ABNORMAL HIGH (ref 65–99)
Glucose-Capillary: 108 mg/dL — ABNORMAL HIGH (ref 65–99)
Glucose-Capillary: 119 mg/dL — ABNORMAL HIGH (ref 65–99)
Glucose-Capillary: 125 mg/dL — ABNORMAL HIGH (ref 65–99)
Glucose-Capillary: 144 mg/dL — ABNORMAL HIGH (ref 65–99)
Glucose-Capillary: 146 mg/dL — ABNORMAL HIGH (ref 65–99)
Glucose-Capillary: 165 mg/dL — ABNORMAL HIGH (ref 65–99)
Glucose-Capillary: 165 mg/dL — ABNORMAL HIGH (ref 65–99)
Glucose-Capillary: 237 mg/dL — ABNORMAL HIGH (ref 65–99)
Glucose-Capillary: 95 mg/dL (ref 65–99)

## 2016-01-29 LAB — POCT I-STAT, CHEM 8
BUN: 11 mg/dL (ref 6–20)
CALCIUM ION: 1.19 mmol/L (ref 1.15–1.40)
CHLORIDE: 103 mmol/L (ref 101–111)
CREATININE: 0.5 mg/dL (ref 0.44–1.00)
Glucose, Bld: 182 mg/dL — ABNORMAL HIGH (ref 65–99)
HCT: 31 % — ABNORMAL LOW (ref 36.0–46.0)
Hemoglobin: 10.5 g/dL — ABNORMAL LOW (ref 12.0–15.0)
Potassium: 4.3 mmol/L (ref 3.5–5.1)
SODIUM: 137 mmol/L (ref 135–145)
TCO2: 24 mmol/L (ref 0–100)

## 2016-01-29 LAB — CBC
HEMATOCRIT: 28.8 % — AB (ref 36.0–46.0)
HEMATOCRIT: 32.5 % — AB (ref 36.0–46.0)
HEMOGLOBIN: 8.9 g/dL — AB (ref 12.0–15.0)
HEMOGLOBIN: 9.9 g/dL — AB (ref 12.0–15.0)
MCH: 25.8 pg — ABNORMAL LOW (ref 26.0–34.0)
MCH: 25.8 pg — ABNORMAL LOW (ref 26.0–34.0)
MCHC: 30.9 g/dL (ref 30.0–36.0)
MCHC: 30.5 g/dL (ref 30.0–36.0)
MCV: 83.5 fL (ref 78.0–100.0)
MCV: 84.9 fL (ref 78.0–100.0)
Platelets: 91 10*3/uL — ABNORMAL LOW (ref 150–400)
Platelets: 120 10*3/uL — ABNORMAL LOW (ref 150–400)
RBC: 3.45 MIL/uL — AB (ref 3.87–5.11)
RBC: 3.83 MIL/uL — ABNORMAL LOW (ref 3.87–5.11)
RDW: 16.1 % — AB (ref 11.5–15.5)
RDW: 16.1 % — ABNORMAL HIGH (ref 11.5–15.5)
WBC: 10.6 10*3/uL — ABNORMAL HIGH (ref 4.0–10.5)
WBC: 14.8 10*3/uL — ABNORMAL HIGH (ref 4.0–10.5)

## 2016-01-29 LAB — HEMOGLOBIN A1C
Hgb A1c MFr Bld: 8.4 % — ABNORMAL HIGH (ref 4.8–5.6)
MEAN PLASMA GLUCOSE: 194 mg/dL

## 2016-01-29 LAB — MAGNESIUM
MAGNESIUM: 2 mg/dL (ref 1.7–2.4)
Magnesium: 2.1 mg/dL (ref 1.7–2.4)

## 2016-01-29 LAB — CREATININE, SERUM: Creatinine, Ser: 0.67 mg/dL (ref 0.44–1.00)

## 2016-01-29 MED ORDER — INSULIN ASPART 100 UNIT/ML ~~LOC~~ SOLN
0.0000 [IU] | SUBCUTANEOUS | Status: DC
  Administered 2016-01-29: 8 [IU] via SUBCUTANEOUS
  Administered 2016-01-30: 4 [IU] via SUBCUTANEOUS

## 2016-01-29 MED ORDER — INSULIN ASPART 100 UNIT/ML ~~LOC~~ SOLN
3.0000 [IU] | Freq: Three times a day (TID) | SUBCUTANEOUS | Status: DC
  Administered 2016-01-29 – 2016-02-02 (×10): 3 [IU] via SUBCUTANEOUS

## 2016-01-29 MED ORDER — FUROSEMIDE 10 MG/ML IJ SOLN
40.0000 mg | Freq: Once | INTRAMUSCULAR | Status: AC
  Administered 2016-01-29: 40 mg via INTRAVENOUS
  Filled 2016-01-29: qty 4

## 2016-01-29 MED ORDER — POTASSIUM CHLORIDE 10 MEQ/50ML IV SOLN
10.0000 meq | INTRAVENOUS | Status: DC
  Administered 2016-01-29 (×2): 10 meq via INTRAVENOUS

## 2016-01-29 MED ORDER — GUAIFENESIN ER 600 MG PO TB12
1200.0000 mg | ORAL_TABLET | Freq: Two times a day (BID) | ORAL | Status: AC
  Administered 2016-01-29 – 2016-01-31 (×5): 1200 mg via ORAL
  Filled 2016-01-29 (×5): qty 2

## 2016-01-29 MED ORDER — POTASSIUM CHLORIDE 10 MEQ/50ML IV SOLN
10.0000 meq | INTRAVENOUS | Status: AC
  Administered 2016-01-29: 10 meq via INTRAVENOUS
  Filled 2016-01-29: qty 50

## 2016-01-29 MED ORDER — INSULIN DETEMIR 100 UNIT/ML ~~LOC~~ SOLN
25.0000 [IU] | Freq: Two times a day (BID) | SUBCUTANEOUS | Status: DC
  Administered 2016-01-29 (×2): 25 [IU] via SUBCUTANEOUS
  Administered 2016-01-30: 20 [IU] via SUBCUTANEOUS
  Filled 2016-01-29 (×4): qty 0.25

## 2016-01-29 MED ORDER — INSULIN DETEMIR 100 UNIT/ML ~~LOC~~ SOLN: 20.0000 [IU] | Freq: Two times a day (BID) | SUBCUTANEOUS | Status: DC

## 2016-01-29 MED ORDER — POTASSIUM CHLORIDE 10 MEQ/50ML IV SOLN
10.0000 meq | INTRAVENOUS | Status: AC
  Administered 2016-01-29 (×2): 10 meq via INTRAVENOUS
  Filled 2016-01-29: qty 50

## 2016-01-29 MED FILL — Lidocaine HCl IV Inj 20 MG/ML: INTRAVENOUS | Qty: 5 | Status: AC

## 2016-01-29 MED FILL — Heparin Sodium (Porcine) Inj 1000 Unit/ML: INTRAMUSCULAR | Qty: 10 | Status: AC

## 2016-01-29 MED FILL — Sodium Bicarbonate IV Soln 8.4%: INTRAVENOUS | Qty: 50 | Status: AC

## 2016-01-29 MED FILL — Electrolyte-R (PH 7.4) Solution: INTRAVENOUS | Qty: 4000 | Status: AC

## 2016-01-29 MED FILL — Sodium Chloride IV Soln 0.9%: INTRAVENOUS | Qty: 3000 | Status: AC

## 2016-01-29 MED FILL — Mannitol IV Soln 20%: INTRAVENOUS | Qty: 500 | Status: AC

## 2016-01-29 NOTE — Progress Notes (Signed)
1 Day Post-Op Procedure(s) (LRB): CORONARY ARTERY BYPASS GRAFTING (CABG) x 3 USING RIGHT LEG GREATER SAPHENOUS VEIN GRAFT (N/A) TRANSESOPHAGEAL ECHOCARDIOGRAM (TEE) (N/A) ENDOVEIN HARVEST OF GREATER SAPHENOUS VEIN (Right) Subjective: Some incisional pain, denies nausea  Objective: Vital signs in last 24 hours: Temp:  [99.1 F (37.3 C)-100.9 F (38.3 C)] 99.3 F (37.4 C) (09/22 0700) Pulse Rate:  [85-92] 89 (09/22 0700) Cardiac Rhythm: Normal sinus rhythm (09/22 0400) Resp:  [13-30] 14 (09/22 0700) BP: (91-146)/(52-91) 98/60 (09/22 0700) SpO2:  [95 %-100 %] 99 % (09/22 0700) Arterial Line BP: (94-187)/(42-79) 139/52 (09/22 0700) FiO2 (%):  [40 %-80 %] 40 % (09/21 1759) Weight:  [166 lb 6.4 oz (75.5 kg)] 166 lb 6.4 oz (75.5 kg) (09/22 0345)  Hemodynamic parameters for last 24 hours: PAP: (22-45)/(12-30) 25/14 CO:  [2.7 L/min-5.9 L/min] 5.9 L/min CI:  [1.6 L/min/m2-3.1 L/min/m2] 3.1 L/min/m2  Intake/Output from previous day: 09/21 0701 - 09/22 0700 In: 4711.6 [P.O.:120; I.V.:3756.6; Blood:325; NG/GT:60; IV Piggyback:450] Out: 7616 [Urine:1275; Emesis/NG output:300; Blood:900; Chest Tube:1050] Intake/Output this shift: Total I/O In: 50 [IV Piggyback:50] Out: -   General appearance: alert, cooperative and no distress Neurologic: intact Heart: regular rate and rhythm Lungs: diminished breath sounds bibasilar Abdomen: normal findings: soft, non-tender  Lab Results:  Recent Labs  01/28/16 1945 01/29/16 0500  WBC 13.4* 10.6*  HGB 9.4* 8.9*  HCT 29.9* 28.8*  PLT 152 91*   BMET:  Recent Labs  01/26/16 2349  01/28/16 1158 01/28/16 1314 01/28/16 1945 01/29/16 0500  NA 135  < > 139 143  --  134*  K 3.8  < > 3.7 3.5  --  3.6  CL 99*  < > 102  --   --  106  CO2 20*  --   --   --   --  21*  GLUCOSE 429*  < > 183* 136*  --  116*  BUN 11  < > 9  --   --  9  CREATININE 0.82  < > 0.30*  --  0.58 0.56  CALCIUM 9.7  --   --   --   --  7.6*  < > = values in this interval  not displayed.  PT/INR:  Recent Labs  01/28/16 1330  LABPROT 19.5*  INR 1.63   ABG    Component Value Date/Time   PHART 7.380 01/28/2016 1948   HCO3 19.9 (L) 01/28/2016 1948   TCO2 21 01/28/2016 1948   ACIDBASEDEF 4.0 (H) 01/28/2016 1948   O2SAT 94.0 01/28/2016 1948   CBG (last 3)   Recent Labs  01/29/16 0436 01/29/16 0549 01/29/16 0659  GLUCAP 125* 119* 115*    Assessment/Plan: S/P Procedure(s) (LRB): CORONARY ARTERY BYPASS GRAFTING (CABG) x 3 USING RIGHT LEG GREATER SAPHENOUS VEIN GRAFT (N/A) TRANSESOPHAGEAL ECHOCARDIOGRAM (TEE) (N/A) ENDOVEIN HARVEST OF GREATER SAPHENOUS VEIN (Right) POD # 1  CV- s/p CABG, in SR, good cardiac index- dc swan, dc A line  RESP- IS for atelectasis  RENAL- creatinine OK, weight up - diurese  ENDO- CBG controlled with insulin drip, transition to levemir + SSI  Thrombocytopenia- PLT down this AM- no heparin/ lovenox  CT dumped 300 ml with dangle- serosanguinous- keep in this AM  Anemia secondary to ABL- mild, follow  DVT prophylaxis- SCD  mobilize   LOS: 2 days    Melrose Nakayama 01/29/2016

## 2016-01-29 NOTE — Progress Notes (Signed)
  Patient ID: [name], female   DOB: 1946-02-04, 70 y.o.   MRN: [id number]   SICU Evening Rounds:   Hemodynamically stable    Urine output good  CT output still significant but thin serosanguinous.  CBC    Component Value Date/Time   WBC 10.6 (H) 01/29/2016 0500   RBC 3.45 (L) 01/29/2016 0500   HGB 10.5 (L) 01/29/2016 1839   HCT 31.0 (L) 01/29/2016 1839   PLT 91 (L) 01/29/2016 0500   MCV 83.5 01/29/2016 0500   MCH 25.8 (L) 01/29/2016 0500   MCHC 30.9 01/29/2016 0500   RDW 16.1 (H) 01/29/2016 0500   LYMPHSABS 1.2 06/02/2015 1458   MONOABS 0.6 06/02/2015 1458   EOSABS 0.1 06/02/2015 1458   BASOSABS 0.0 06/02/2015 1458     BMET    Component Value Date/Time   NA 137 01/29/2016 1839   NA 140 07/24/2015 0933   K 4.3 01/29/2016 1839   CL 103 01/29/2016 1839   CO2 21 (L) 01/29/2016 0500   GLUCOSE 182 (H) 01/29/2016 1839   BUN 11 01/29/2016 1839   BUN 11 07/24/2015 0933   CREATININE 0.50 01/29/2016 1839   CREATININE 0.66 12/11/2015 0927   CALCIUM 7.6 (L) 01/29/2016 0500   GFRNONAA >60 01/29/2016 0500   GFRAA >60 01/29/2016 0500     A/P:  Stable postop course. Continue current plans

## 2016-01-29 NOTE — Op Note (Signed)
NAMENALINA, YEATMAN NO.:  [id number]  MEDICAL RECORD NO.:  [id number]  LOCATION:  2S04C                        FACILITY:  [facility]  PHYSICIAN:  Revonda Standard. Roxan Hockey, M.D.DATE OF BIRTH:  January 11, 1946  DATE OF PROCEDURE:  01/28/2016 DATE OF DISCHARGE:                              OPERATIVE REPORT   PREOPERATIVE DIAGNOSIS:  3-vessel coronary disease, status post non ST elevation myocardial infarction.  POSTOPERATIVE DIAGNOSIS:  3-vessel coronary disease, status post non ST elevation myocardial infarction.  PROCEDURE:  Median sternotomy, extracorporeal circulation,  Coronary artery bypass grafting x 3  Left internal mammary artery to left anterior descending,  Saphenous vein graft to ramus intermedius,  Saphenous vein graft to distal right coronary  SURGEON:  Revonda Standard. Roxan Hockey, M.D.  ASSISTANT:  Ellwood Handler, PA-C.  ANESTHESIA:  General.  FINDINGS:  Vein fair quality.  Mammary good quality.  Good quality targets.  Transesophageal echocardiography revealed mild mitral regurgitation with preserved left ventricular function.  CLINICAL NOTE:  Mrs. Hausmann is a 70 year old woman with multiple cardiac risk factors, who presented with a chief complaint of chest pain.  This was responsive to nitroglycerin.  She ruled in for a non ST elevation MI.  At catheterization, she had severe three-vessel disease with a totally occluded right coronary and severe stenoses in the proximal LAD and proximal ramus intermedius.  She was referred for coronary artery bypass grafting.  The indications, risks, benefits, and alternatives were discussed in detail with the patient.  She understood and accepted the risks and agreed to proceed.  OPERATIVE NOTE:  Mrs. Sieh was brought to the preoperative holding area on 01/28/2016.  Anesthesia placed a Swan-Ganz catheter and an arterial blood pressure monitoring line.  She was taken to the operating room, anesthetized, and intubated.  A  Foley catheter was placed.  Intravenous antibiotics were administered.  Transesophageal echocardiography was performed.  Please refer to Dr. Conrad Penalosa note for full details.  The chest, abdomen, and legs were prepped and draped in usual sterile fashion.  A median sternotomy was performed and the left internal mammary artery was harvested using standard technique.  Simultaneously, an incision was made in the medial aspect of the right leg below the knee.  The greater saphenous vein was harvested from mid calf to the groin. The saphenous vein was fair quality.  The mammary artery was good quality with excellent flow.  2000 units of heparin was administered during the vessel harvest.  The remainder of the full heparin dose was given prior to opening the pericardium.  After harvesting the conduits, the pericardium was opened.  The ascending aorta was inspected.  There was no evidence of atherosclerotic disease.  After confirming adequate anticoagulation with ACT measurement, the aorta was cannulated via concentric 2-0 Ethibond pledgeted pursestring sutures.  A dual-stage venous cannula was placed via a pursestring suture in right atrial appendage.  Cardiopulmonary bypass was initiated.  Flows were maintained per protocol.  The patient was cooled to 32 degrees Celsius.  The coronary arteries were inspected and anastomotic sites were chosen.  The conduits were inspected and cut to length.  A foam pad was placed in the pericardium to insulate the heart.  A temperature probe was  placed in the myocardial septum and a cardioplegia cannula was placed in the ascending aorta.  The aorta was crossclamped.  The left ventricle was emptied via the aortic root vent.  Cardiac arrest then was achieved with a combination of cold antegrade blood cardioplegia and topical iced saline.  After achieving a complete diastolic arrest and adequate septal cooling to 12 degrees Celsius, the following distal anastomoses  were performed.  First, a reversed saphenous vein graft was placed end-to-side to the distal right coronary.  This was a 2 mm good quality target that gave rise to 3 sizable branches.  The probe passed easily distally in the vessel.  The vein was anastomosed end-to-side with a running 7-0 Prolene suture.  All anastomoses were probed proximally and distally to ensure patency before tying the suture.  Cardioplegia was administered down the vein.  There were good flow and good hemostasis.  After giving additional cardioplegia down the aortic root.  The heart was elevated exposing the ramus intermedius.  This was a 1.5 mm good quality target.  The vein was anastomosed end-to-side with a running 7-0 Prolene suture.  Again, a probe passed easily proximally and distally, and again there were good flow and good hemostasis with cardioplegia administration.  The left internal mammary artery was brought through a window in the pericardium.  The distal end was beveled.  It was anastomosed end-to- side to the distal LAD.  The LAD was a 2 mm good quality target.  The mammary was a 2 mm good quality conduit.  The end-to-side anastomosis was performed with a running 8-0 Prolene suture.  At the completion of anastomosis, the bulldog clamp was briefly removed.  Rapid septal rewarming was noted.  The bulldog clamp was replaced.  The mammary pedicle was tacked to the epicardial surface of the heart with 6-0 Prolene sutures.  Additional cardioplegia was administered.  The vein grafts were cut to length.  The cardioplegia cannula was removed in the ascending aorta.  The proximal vein graft anastomoses were performed to 4.0 mm punch aortotomies with running 6-0 Prolene sutures.  At the completion of the final proximal anastomosis, the patient was placed in Trendelenburg position.  Lidocaine was administered.  The aortic root was de-aired and the aortic crossclamp was removed.  Total crossclamp time was 58  minutes.  The patient required a single defibrillation with 10 joules and then remained in sinus rhythm thereafter.  While rewarming was completed, all proximal and distal anastomoses were inspected for hemostasis.  Epicardial pacing wires were placed on the right ventricle and right atrium.  When the patient had rewarmed to a core temperature of 37 degrees Celsius, she was weaned from cardiopulmonary bypass on the first attempt.  She was atrially paced and on no inotropic support at the time of separation from bypass.  The total bypass time was 93 minutes.  The initial cardiac index was greater than 2 L/min per m2.  Post bypass transesophageal echocardiography was unchanged from the prebypass study showing preserved left ventricular function and mild mitral regurgitation.  A test dose of protamine was administered and was well tolerated.  The atrial and aortic cannulae were removed.  The remainder of the protamine was administered without incident.  The chest was irrigated with warm saline.  Hemostasis was achieved.  Left pleural mediastinal chest tubes were placed through separate subcostal incisions.  The pericardium was reapproximated with interrupted 3-0 silk sutures and came together without tension or kinking of the underlying grafts.  The sternum  was closed with a combination of single and double heavy gauge stainless steel wires.  The pectoralis fascia, subcutaneous tissue, and skin were closed in standard fashion.  All sponge, needle, and instrument counts were correct at the end of the procedure.  The patient was taken from the operating room to the Surgical Intensive Care Unit intubated and in good condition.     Revonda Standard Roxan Hockey, M.D.     SCH/MEDQ  D:  01/28/2016  T:  01/29/2016  Job:  [id number]

## 2016-01-30 ENCOUNTER — Inpatient Hospital Stay (HOSPITAL_COMMUNITY): Payer: Medicare Other

## 2016-01-30 LAB — CBC
HCT: 29.2 % — ABNORMAL LOW (ref 36.0–46.0)
HEMOGLOBIN: 9.2 g/dL — AB (ref 12.0–15.0)
MCH: 26.5 pg (ref 26.0–34.0)
MCHC: 31.5 g/dL (ref 30.0–36.0)
MCV: 84.1 fL (ref 78.0–100.0)
PLATELETS: 103 10*3/uL — AB (ref 150–400)
RBC: 3.47 MIL/uL — ABNORMAL LOW (ref 3.87–5.11)
RDW: 16 % — ABNORMAL HIGH (ref 11.5–15.5)
WBC: 12 10*3/uL — ABNORMAL HIGH (ref 4.0–10.5)

## 2016-01-30 LAB — GLUCOSE, CAPILLARY
GLUCOSE-CAPILLARY: 79 mg/dL (ref 65–99)
GLUCOSE-CAPILLARY: 89 mg/dL (ref 65–99)
GLUCOSE-CAPILLARY: 108 mg/dL — AB (ref 65–99)
Glucose-Capillary: 175 mg/dL — ABNORMAL HIGH (ref 65–99)

## 2016-01-30 LAB — BASIC METABOLIC PANEL
Anion gap: 7 (ref 5–15)
BUN: 8 mg/dL (ref 6–20)
CALCIUM: 8.7 mg/dL — AB (ref 8.9–10.3)
CHLORIDE: 106 mmol/L (ref 101–111)
CO2: 26 mmol/L (ref 22–32)
CREATININE: 0.55 mg/dL (ref 0.44–1.00)
GFR calc Af Amer: >60 mL/min (ref >60)
GFR calc non Af Amer: >60 mL/min (ref >60)
GLUCOSE: 75 mg/dL (ref 65–99)
Potassium: 3.9 mmol/L (ref 3.5–5.1)
Sodium: 139 mmol/L (ref 135–145)

## 2016-01-30 MED ORDER — INSULIN ASPART 100 UNIT/ML ~~LOC~~ SOLN: 0.0000 [IU] | SUBCUTANEOUS | Status: DC

## 2016-01-30 MED ORDER — INSULIN ASPART 100 UNIT/ML ~~LOC~~ SOLN
0.0000 [IU] | Freq: Three times a day (TID) | SUBCUTANEOUS | Status: DC
  Administered 2016-01-30: 4 [IU] via SUBCUTANEOUS

## 2016-01-30 MED ORDER — ENOXAPARIN SODIUM 40 MG/0.4ML ~~LOC~~ SOLN
40.0000 mg | Freq: Every day | SUBCUTANEOUS | Status: DC
  Administered 2016-01-30 – 2016-02-02 (×4): 40 mg via SUBCUTANEOUS
  Filled 2016-01-30 (×4): qty 0.4

## 2016-01-30 MED ORDER — ORAL CARE MOUTH RINSE: 15.0000 mL | Freq: Two times a day (BID) | OROMUCOSAL | Status: DC

## 2016-01-30 MED ORDER — CHLORHEXIDINE GLUCONATE 0.12 % MT SOLN
15.0000 mL | Freq: Two times a day (BID) | OROMUCOSAL | Status: DC
  Administered 2016-01-30: 15 mL via OROMUCOSAL

## 2016-01-30 MED ORDER — FUROSEMIDE 40 MG PO TABS
40.0000 mg | ORAL_TABLET | Freq: Every day | ORAL | Status: DC
  Administered 2016-01-30 – 2016-01-31 (×2): 40 mg via ORAL
  Filled 2016-01-30 (×2): qty 1

## 2016-01-30 MED ORDER — POTASSIUM CHLORIDE CRYS ER 20 MEQ PO TBCR
20.0000 meq | EXTENDED_RELEASE_TABLET | Freq: Two times a day (BID) | ORAL | Status: DC
  Administered 2016-01-30 – 2016-01-31 (×4): 20 meq via ORAL
  Filled 2016-01-30 (×4): qty 1

## 2016-01-30 MED ORDER — INSULIN DETEMIR 100 UNIT/ML ~~LOC~~ SOLN
20.0000 [IU] | Freq: Two times a day (BID) | SUBCUTANEOUS | Status: DC
  Administered 2016-01-30 – 2016-01-31 (×3): 20 [IU] via SUBCUTANEOUS
  Filled 2016-01-30 (×4): qty 0.2

## 2016-01-30 NOTE — Progress Notes (Signed)
Patient ID: [name], female   DOB: [birth year], 70 y.o.   MRN: [id number]  SICU Evening Rounds:   Hemodynamically stable    Urine output good    CBC    Component Value Date/Time   WBC 12.0 (H) 01/30/2016 0441   RBC 3.47 (L) 01/30/2016 0441   HGB 9.2 (L) 01/30/2016 0441   HCT 29.2 (L) 01/30/2016 0441   PLT 103 (L) 01/30/2016 0441   MCV 84.1 01/30/2016 0441   MCH 26.5 01/30/2016 0441   MCHC 31.5 01/30/2016 0441   RDW 16.0 (H) 01/30/2016 0441   LYMPHSABS 1.2 06/02/2015 1458   MONOABS 0.6 06/02/2015 1458   EOSABS 0.1 06/02/2015 1458   BASOSABS 0.0 06/02/2015 1458     BMET    Component Value Date/Time   NA 139 01/30/2016 0441   NA 140 07/24/2015 0933   K 3.9 01/30/2016 0441   CL 106 01/30/2016 0441   CO2 26 01/30/2016 0441   GLUCOSE 75 01/30/2016 0441   BUN 8 01/30/2016 0441   BUN 11 07/24/2015 0933   CREATININE 0.55 01/30/2016 0441   CREATININE 0.66 12/11/2015 0927   CALCIUM 8.7 (L) 01/30/2016 0441   GFRNONAA >60 01/30/2016 0441   GFRAA >60 01/30/2016 0441     A/P:  Stable postop course. Continue current plans

## 2016-01-30 NOTE — Progress Notes (Signed)
2 Days Post-Op Procedure(s) (LRB): CORONARY ARTERY BYPASS GRAFTING (CABG) x 3 USING RIGHT LEG GREATER SAPHENOUS VEIN GRAFT (N/A) TRANSESOPHAGEAL ECHOCARDIOGRAM (TEE) (N/A) ENDOVEIN HARVEST OF GREATER SAPHENOUS VEIN (Right) Subjective:  Had BM. Complains of gas pains.  Objective: Vital signs in last 24 hours: Temp:  [97.6 F (36.4 C)-98.8 F (37.1 C)] 97.7 F (36.5 C) (09/23 0800) Pulse Rate:  [78-93] 84 (09/23 0800) Cardiac Rhythm: Normal sinus rhythm;Atrial paced (09/23 0800) Resp:  [9-30] 20 (09/23 0800) BP: (106-140)/(53-72) 117/65 (09/23 0800) SpO2:  [92 %-100 %] 92 % (09/23 0800) Arterial Line BP: (136-165)/(59) 136/59 (09/22 1100) Weight:  [74.4 kg (164 lb 0.4 oz)] 74.4 kg (164 lb 0.4 oz) (09/23 0500)  Hemodynamic parameters for last 24 hours: PAP: (27-31)/(10-17) 31/17  Intake/Output from previous day: 09/22 0701 - 09/23 0700 In: 360 [I.V.:260; IV Piggyback:100] Out: 2895 [Urine:2200; Chest Tube:695] Intake/Output this shift: Total I/O In: 20 [I.V.:20] Out: -   General appearance: alert and cooperative Neurologic: intact Heart: regular rate and rhythm, S1, S2 normal, no murmur, click, rub or gallop Lungs: clear to auscultation bilaterally Extremities: extremities normal, atraumatic, no cyanosis or edema Wound: dressing dry  Lab Results:  Recent Labs  01/29/16 1700 01/29/16 1839 01/30/16 0441  WBC 14.8*  --  12.0*  HGB 9.9* 10.5* 9.2*  HCT 32.5* 31.0* 29.2*  PLT 120*  --  103*   BMET:  Recent Labs  01/29/16 0500  01/29/16 1839 01/30/16 0441  NA 134*  --  137 139  K 3.6  --  4.3 3.9  CL 106  --  103 106  CO2 21*  --   --  26  GLUCOSE 116*  --  182* 75  BUN 9  --  11 8  CREATININE 0.56  < > 0.50 0.55  CALCIUM 7.6*  --   --  8.7*  < > = values in this interval not displayed.  PT/INR:  Recent Labs  01/28/16 1330  LABPROT 19.5*  INR 1.63   ABG    Component Value Date/Time   PHART 7.380 01/28/2016 1948   HCO3 19.9 (L) 01/28/2016 1948   TCO2 24 01/29/2016 1839   ACIDBASEDEF 4.0 (H) 01/28/2016 1948   O2SAT 94.0 01/28/2016 1948   CBG (last 3)   Recent Labs  01/29/16 2352 01/30/16 0424 01/30/16 0848  GLUCAP 165* 79 89    Assessment/Plan: S/P Procedure(s) (LRB): CORONARY ARTERY BYPASS GRAFTING (CABG) x 3 USING RIGHT LEG GREATER SAPHENOUS VEIN GRAFT (N/A) TRANSESOPHAGEAL ECHOCARDIOGRAM (TEE) (N/A) ENDOVEIN HARVEST OF GREATER SAPHENOUS VEIN (Right)  She is hemodynamically stable POD 2. Continue Lopressor  Remove chest tubes, sleeve and foley  Continue diuresis for volume excess  DM: Hgb A1c was 8.4 preop. Continue Levemir and SSI with meal coverage  IS and ambulation.   LOS: 3 days    Gaye Pollack 01/30/2016

## 2016-01-31 ENCOUNTER — Inpatient Hospital Stay (HOSPITAL_COMMUNITY): Payer: Medicare Other

## 2016-01-31 LAB — TYPE AND SCREEN
ABO/RH(D): A POS
Antibody Screen: NEGATIVE
UNIT DIVISION: 0
UNIT DIVISION: 0
Unit division: 0
Unit division: 0

## 2016-01-31 LAB — BASIC METABOLIC PANEL
Anion gap: 8 (ref 5–15)
BUN: 12 mg/dL (ref 6–20)
CHLORIDE: 104 mmol/L (ref 101–111)
CO2: 23 mmol/L (ref 22–32)
CREATININE: 0.54 mg/dL (ref 0.44–1.00)
Calcium: 8.2 mg/dL — ABNORMAL LOW (ref 8.9–10.3)
GFR calc Af Amer: >60 mL/min (ref >60)
GFR calc non Af Amer: >60 mL/min (ref >60)
Glucose, Bld: 148 mg/dL — ABNORMAL HIGH (ref 65–99)
POTASSIUM: 4.4 mmol/L (ref 3.5–5.1)
Sodium: 135 mmol/L (ref 135–145)

## 2016-01-31 LAB — GLUCOSE, CAPILLARY
GLUCOSE-CAPILLARY: 182 mg/dL — AB (ref 65–99)
GLUCOSE-CAPILLARY: 200 mg/dL — AB (ref 65–99)
Glucose-Capillary: 134 mg/dL — ABNORMAL HIGH (ref 65–99)
Glucose-Capillary: 281 mg/dL — ABNORMAL HIGH (ref 65–99)

## 2016-01-31 LAB — CBC
HEMATOCRIT: 30.3 % — AB (ref 36.0–46.0)
Hemoglobin: 9.6 g/dL — ABNORMAL LOW (ref 12.0–15.0)
MCH: 27 pg (ref 26.0–34.0)
MCHC: 31.7 g/dL (ref 30.0–36.0)
MCV: 85.4 fL (ref 78.0–100.0)
PLATELETS: 126 10*3/uL — AB (ref 150–400)
RBC: 3.55 MIL/uL — AB (ref 3.87–5.11)
RDW: 16.6 % — ABNORMAL HIGH (ref 11.5–15.5)
WBC: 9.6 10*3/uL (ref 4.0–10.5)

## 2016-01-31 MED ORDER — SODIUM CHLORIDE 0.9% FLUSH: 3.0000 mL | INTRAVENOUS | Status: DC | PRN

## 2016-01-31 MED ORDER — SODIUM CHLORIDE 0.9 % IV SOLN
250.0000 mL | INTRAVENOUS | Status: DC | PRN
Start: 2016-01-31 — End: 2016-02-03

## 2016-01-31 MED ORDER — MOVING RIGHT ALONG BOOK
Freq: Once | Status: AC
  Administered 2016-01-31: 17:00:00
  Filled 2016-01-31: qty 1

## 2016-01-31 MED ORDER — ACETAMINOPHEN 325 MG PO TABS
650.0000 mg | ORAL_TABLET | Freq: Four times a day (QID) | ORAL | Status: DC | PRN
  Administered 2016-01-31 – 2016-02-03 (×5): 650 mg via ORAL
  Filled 2016-01-31 (×4): qty 2

## 2016-01-31 MED ORDER — ASPIRIN EC 325 MG PO TBEC
325.0000 mg | DELAYED_RELEASE_TABLET | Freq: Every day | ORAL | Status: DC
  Administered 2016-02-01 – 2016-02-03 (×3): 325 mg via ORAL
  Filled 2016-01-31 (×4): qty 1

## 2016-01-31 MED ORDER — INSULIN ASPART 100 UNIT/ML ~~LOC~~ SOLN
0.0000 [IU] | Freq: Three times a day (TID) | SUBCUTANEOUS | Status: DC
  Administered 2016-01-31 (×2): 4 [IU] via SUBCUTANEOUS
  Administered 2016-02-01: 2 [IU] via SUBCUTANEOUS
  Administered 2016-02-01: 4 [IU] via SUBCUTANEOUS

## 2016-01-31 MED ORDER — ATORVASTATIN CALCIUM 20 MG PO TABS
20.0000 mg | ORAL_TABLET | Freq: Every day | ORAL | Status: DC
  Administered 2016-01-31 – 2016-02-02 (×3): 20 mg via ORAL
  Filled 2016-01-31 (×3): qty 1

## 2016-01-31 MED ORDER — METOPROLOL TARTRATE 12.5 MG HALF TABLET
12.5000 mg | ORAL_TABLET | Freq: Two times a day (BID) | ORAL | Status: DC
  Administered 2016-01-31: 12.5 mg via ORAL
  Filled 2016-01-31: qty 1

## 2016-01-31 MED ORDER — SODIUM CHLORIDE 0.9% FLUSH
3.0000 mL | Freq: Two times a day (BID) | INTRAVENOUS | Status: DC
Start: 2016-01-31 — End: 2016-02-03
  Administered 2016-01-31 – 2016-02-03 (×6): 3 mL via INTRAVENOUS

## 2016-01-31 NOTE — Progress Notes (Signed)
3 Days Post-Op Procedure(s) (LRB): CORONARY ARTERY BYPASS GRAFTING (CABG) x 3 USING RIGHT LEG GREATER SAPHENOUS VEIN GRAFT (N/A) TRANSESOPHAGEAL ECHOCARDIOGRAM (TEE) (N/A) ENDOVEIN HARVEST OF GREATER SAPHENOUS VEIN (Right) Subjective:  No complaints. Walking well. Bowels working  Objective: Vital signs in last 24 hours: Temp:  [98.4 F (36.9 C)-99.1 F (37.3 C)] 98.6 F (37 C) (09/24 0700) Pulse Rate:  [77-97] 84 (09/24 1100) Cardiac Rhythm: Normal sinus rhythm (09/24 1100) Resp:  [16-30] 22 (09/24 1100) BP: (98-164)/(56-90) 107/57 (09/24 1100) SpO2:  [88 %-100 %] 93 % (09/24 1100) Weight:  [73.3 kg (161 lb 9.6 oz)] 73.3 kg (161 lb 9.6 oz) (09/24 0500)  Hemodynamic parameters for last 24 hours:    Intake/Output from previous day: 09/23 0701 - 09/24 0700 In: 40 [I.V.:40] Out: 750 [Urine:550; Chest Tube:200] Intake/Output this shift: No intake/output data recorded.  General appearance: alert Neurologic: intact Heart: regular rate and rhythm, S1, S2 normal, no murmur, click, rub or gallop Lungs: clear to auscultation bilaterally Extremities: extremities normal, atraumatic, no cyanosis or edema Wound: incisions ok  Lab Results:  Recent Labs  01/30/16 0441 01/31/16 0507  WBC 12.0* 9.6  HGB 9.2* 9.6*  HCT 29.2* 30.3*  PLT 103* 126*   BMET:  Recent Labs  01/30/16 0441 01/31/16 0507  NA 139 135  K 3.9 4.4  CL 106 104  CO2 26 23  GLUCOSE 75 148*  BUN 8 12  CREATININE 0.55 0.54  CALCIUM 8.7* 8.2*    PT/INR:  Recent Labs  01/28/16 1330  LABPROT 19.5*  INR 1.63   ABG    Component Value Date/Time   PHART 7.380 01/28/2016 1948   HCO3 19.9 (L) 01/28/2016 1948   TCO2 24 01/29/2016 1839   ACIDBASEDEF 4.0 (H) 01/28/2016 1948   O2SAT 94.0 01/28/2016 1948   CBG (last 3)   Recent Labs  01/30/16 1235 01/30/16 1624 01/31/16 0854  GLUCAP 108* 175* 134*   CLINICAL DATA:  Status post CABG on 01/28/2016.  Hypertension.  EXAM: PORTABLE CHEST 1  VIEW  COMPARISON:  Yesterday.  FINDINGS: The right jugular catheter sheath tip remains in the superior vena cava. The mediastinal and left chest tubes have been removed. No pneumothorax.  The cardiac silhouette remains mildly enlarged with post CABG changes again demonstrated. Significantly increased left basilar opacity with interval right basilar opacity. The pulmonary vasculature remains mildly prominent. Interval mild prominence of the interstitial markings in the lower lung zones. No acute bony abnormality.  IMPRESSION: 1. Significantly increased left basilar atelectasis or pneumonia with a possible pleural effusion. 2. Interval right basilar atelectasis, pneumonia or alveolar edema and possible pleural effusion. 3. Stable mild pulmonary vascular congestion. 4. Interval mild prominence of the interstitial markings in the lower lung zones, most likely representing pulmonary edema.   Electronically Signed   By: Claudie Revering M.D.   On: 01/31/2016 08:00   Assessment/Plan: S/P Procedure(s) (LRB): CORONARY ARTERY BYPASS GRAFTING (CABG) x 3 USING RIGHT LEG GREATER SAPHENOUS VEIN GRAFT (N/A) TRANSESOPHAGEAL ECHOCARDIOGRAM (TEE) (N/A) ENDOVEIN HARVEST OF GREATER SAPHENOUS VEIN (Right)  She is doing well in sinus rhythm.  CXR this am shows increased atelectasis in bases but she is using IS and walking well. Lungs sound clear.   Wt is 6 lbs over preop. Continue diuresis.  DM: glucose under control  Expected acute postop blood loss anemia: stable.  Will transfer to 2W and continue IS and ambulation.  LOS: 4 days    Gaye Pollack 01/31/2016

## 2016-01-31 NOTE — Progress Notes (Signed)
Patient ID: [name], female   DOB: 1946-03-23, 70 y.o.   MRN: [id number]  SICU Evening Rounds:  Stable day. Awaiting bed on 2W.

## 2016-02-01 LAB — BASIC METABOLIC PANEL
Anion gap: 6 (ref 5–15)
BUN: 10 mg/dL (ref 6–20)
CHLORIDE: 108 mmol/L (ref 101–111)
CO2: 24 mmol/L (ref 22–32)
Calcium: 8.2 mg/dL — ABNORMAL LOW (ref 8.9–10.3)
Creatinine, Ser: 0.48 mg/dL (ref 0.44–1.00)
GFR calc non Af Amer: >60 mL/min (ref >60)
Glucose, Bld: 100 mg/dL — ABNORMAL HIGH (ref 65–99)
POTASSIUM: 3.6 mmol/L (ref 3.5–5.1)
SODIUM: 138 mmol/L (ref 135–145)

## 2016-02-01 LAB — CBC
HEMATOCRIT: 29.5 % — AB (ref 36.0–46.0)
HEMOGLOBIN: 9.2 g/dL — AB (ref 12.0–15.0)
MCH: 26.4 pg (ref 26.0–34.0)
MCHC: 31.2 g/dL (ref 30.0–36.0)
MCV: 84.8 fL (ref 78.0–100.0)
Platelets: 149 10*3/uL — ABNORMAL LOW (ref 150–400)
RBC: 3.48 MIL/uL — AB (ref 3.87–5.11)
RDW: 16.5 % — ABNORMAL HIGH (ref 11.5–15.5)
WBC: 6.6 10*3/uL (ref 4.0–10.5)

## 2016-02-01 LAB — GLUCOSE, CAPILLARY
GLUCOSE-CAPILLARY: 92 mg/dL (ref 65–99)
GLUCOSE-CAPILLARY: 163 mg/dL — AB (ref 65–99)
Glucose-Capillary: 105 mg/dL — ABNORMAL HIGH (ref 65–99)
Glucose-Capillary: 141 mg/dL — ABNORMAL HIGH (ref 65–99)

## 2016-02-01 MED ORDER — POTASSIUM CHLORIDE CRYS ER 20 MEQ PO TBCR
40.0000 meq | EXTENDED_RELEASE_TABLET | Freq: Two times a day (BID) | ORAL | Status: AC
Start: 2016-02-01 — End: 2016-02-01
  Administered 2016-02-01 (×2): 40 meq via ORAL
  Filled 2016-02-01 (×2): qty 2

## 2016-02-01 MED ORDER — METFORMIN HCL 500 MG PO TABS
1000.0000 mg | ORAL_TABLET | Freq: Two times a day (BID) | ORAL | Status: DC
  Administered 2016-02-01 – 2016-02-03 (×5): 1000 mg via ORAL
  Filled 2016-02-01 (×5): qty 2

## 2016-02-01 MED ORDER — METOPROLOL TARTRATE 25 MG PO TABS
25.0000 mg | ORAL_TABLET | Freq: Two times a day (BID) | ORAL | Status: DC
  Administered 2016-02-01 (×2): 25 mg via ORAL
  Filled 2016-02-01 (×3): qty 1

## 2016-02-01 MED ORDER — OXYCODONE HCL 5 MG PO TABS
5.0000 mg | ORAL_TABLET | ORAL | Status: DC | PRN
  Administered 2016-02-01: 5 mg via ORAL
  Administered 2016-02-01: 10 mg via ORAL
  Filled 2016-02-01: qty 2
  Filled 2016-02-01: qty 1

## 2016-02-01 MED ORDER — POTASSIUM CHLORIDE CRYS ER 20 MEQ PO TBCR
20.0000 meq | EXTENDED_RELEASE_TABLET | ORAL | Status: DC | PRN
  Administered 2016-02-01: 20 meq via ORAL
  Filled 2016-02-01 (×2): qty 1

## 2016-02-01 MED ORDER — FUROSEMIDE 40 MG PO TABS
40.0000 mg | ORAL_TABLET | Freq: Every day | ORAL | Status: DC
  Administered 2016-02-01 – 2016-02-03 (×3): 40 mg via ORAL
  Filled 2016-02-01 (×3): qty 1

## 2016-02-01 MED ORDER — INSULIN DETEMIR 100 UNIT/ML ~~LOC~~ SOLN
25.0000 [IU] | Freq: Two times a day (BID) | SUBCUTANEOUS | Status: DC
  Administered 2016-02-01 – 2016-02-03 (×5): 25 [IU] via SUBCUTANEOUS
  Filled 2016-02-01 (×7): qty 0.25

## 2016-02-01 NOTE — Progress Notes (Signed)
Patient arribved the unit on a hospital bed, assesment completed see flowsheet, placed on tele ccmd notified, patient oriented to room and staff, bed in lowest position, call light within reach will continue to monitor

## 2016-02-01 NOTE — Progress Notes (Signed)
4 Days Post-Op Procedure(s) (LRB): CORONARY ARTERY BYPASS GRAFTING (CABG) x 3 USING RIGHT LEG GREATER SAPHENOUS VEIN GRAFT (N/A) TRANSESOPHAGEAL ECHOCARDIOGRAM (TEE) (N/A) ENDOVEIN HARVEST OF GREATER SAPHENOUS VEIN (Right) Subjective: C/o incisional pain, (pain meds were dc'ed with transfer orders) +BM this AM  Objective: Vital signs in last 24 hours: Temp:  [98.1 F (36.7 C)-98.8 F (37.1 C)] 98.1 F (36.7 C) (09/25 0400) Pulse Rate:  [71-97] 78 (09/25 0800) Cardiac Rhythm: Normal sinus rhythm (09/25 0800) Resp:  [18-30] 20 (09/25 0800) BP: (99-132)/(55-106) 120/60 (09/25 0800) SpO2:  [93 %-99 %] 97 % (09/25 0800) Weight:  [157 lb 6.4 oz (71.4 kg)] 157 lb 6.4 oz (71.4 kg) (09/25 0500)  Hemodynamic parameters for last 24 hours:    Intake/Output from previous day: No intake/output data recorded. Intake/Output this shift: No intake/output data recorded.  General appearance: alert, cooperative and no distress Neurologic: intact Heart: regular rate and rhythm Lungs: diminished breath sounds bibasilar Abdomen: normal findings: soft, non-tender Wound: clean and dry  Lab Results:  Recent Labs  01/31/16 0507 02/01/16 0156  WBC 9.6 6.6  HGB 9.6* 9.2*  HCT 30.3* 29.5*  PLT 126* 149*   BMET:  Recent Labs  01/31/16 0507 02/01/16 0156  NA 135 138  K 4.4 3.6  CL 104 108  CO2 23 24  GLUCOSE 148* 100*  BUN 12 10  CREATININE 0.54 0.48  CALCIUM 8.2* 8.2*    PT/INR: No results for input(s): LABPROT, INR in the last 72 hours. ABG    Component Value Date/Time   PHART 7.380 01/28/2016 1948   HCO3 19.9 (L) 01/28/2016 1948   TCO2 24 01/29/2016 1839   ACIDBASEDEF 4.0 (H) 01/28/2016 1948   O2SAT 94.0 01/28/2016 1948   CBG (last 3)   Recent Labs  01/31/16 1236 01/31/16 1635 01/31/16 2204  GLUCAP 281* 182* 200*    Assessment/Plan: S/P Procedure(s) (LRB): CORONARY ARTERY BYPASS GRAFTING (CABG) x 3 USING RIGHT LEG GREATER SAPHENOUS VEIN GRAFT  (N/A) TRANSESOPHAGEAL ECHOCARDIOGRAM (TEE) (N/A) ENDOVEIN HARVEST OF GREATER SAPHENOUS VEIN (Right) Plan for transfer to step-down: see transfer orders Has transfer orders to 2 west CV- stable, still has PVCs, will increase beta blocker  Add ACE-I prior to dc if BP allows  RESP- IS for bibasilar atelectasis, PO lasix for bilateral pleural effusions  RENAL- creatinine normal, supplement K  ENDO_ CBG markedly elevated yesterday  Increase levemir  Restart metformin  Continue SSI  Anemia secondary to ABL- mild follow  SCD + enoxaparin for DVT prophylaxis  Increase ambulation   LOS: 5 days    Melrose Nakayama 02/01/2016

## 2016-02-01 NOTE — Care Management Important Message (Signed)
Important Message  Patient Details  Name: [name] MRN: [id number] Date of Birth: 01-11-1946   Medicare Important Message Given:  Yes    Nathen May 02/01/2016, 10:56 AM

## 2016-02-01 NOTE — Progress Notes (Signed)
CARDIAC REHAB PHASE I   PRE:  Rate/Rhythm: 73 SR  BP:  Sitting: 11057        SaO2: 99 RA  MODE:  Ambulation: 350 ft   POST:  Rate/Rhythm: 81 SR  BP:  Sitting: 126/52         SaO2: 98 RA  Pt ambulated 350 ft on RA, rolling walker, slow, steady gait, tolerated well with no complaints other than fatigue with increased distance. Pt to recliner after walk, feet elevated, call bell within reach. Encoruaged IS, additional ambulation x1 today. Will follow.   [place], RN, BSN 02/01/2016 1:22 PM

## 2016-02-02 ENCOUNTER — Encounter: Payer: Medicare Other | Admitting: Gastroenterology

## 2016-02-02 DIAGNOSIS — Z951 Presence of aortocoronary bypass graft: Secondary | ICD-10-CM

## 2016-02-02 LAB — GLUCOSE, CAPILLARY
GLUCOSE-CAPILLARY: 92 mg/dL (ref 65–99)
GLUCOSE-CAPILLARY: 107 mg/dL — AB (ref 65–99)
GLUCOSE-CAPILLARY: 118 mg/dL — AB (ref 65–99)
Glucose-Capillary: 104 mg/dL — ABNORMAL HIGH (ref 65–99)

## 2016-02-02 MED ORDER — INFLUENZA VAC SPLIT QUAD 0.5 ML IM SUSY
0.5000 mL | PREFILLED_SYRINGE | INTRAMUSCULAR | Status: DC
  Filled 2016-02-02: qty 0.5

## 2016-02-02 MED ORDER — DM-GUAIFENESIN ER 30-600 MG PO TB12
1.0000 | ORAL_TABLET | Freq: Two times a day (BID) | ORAL | Status: DC | PRN
  Administered 2016-02-02: 1 via ORAL
  Filled 2016-02-02: qty 1

## 2016-02-02 MED ORDER — METOPROLOL TARTRATE 25 MG PO TABS
37.5000 mg | ORAL_TABLET | Freq: Two times a day (BID) | ORAL | Status: DC
  Administered 2016-02-02 – 2016-02-03 (×3): 37.5 mg via ORAL
  Filled 2016-02-02 (×4): qty 1

## 2016-02-02 NOTE — Consult Note (Signed)
[facility] CM Primary Care Navigator  02/02/2016  [name] Apr 16, 1946 [id number]  Met with patient and husband [name]) at the bedside to identify possible discharge needs. Patient stated chest pains had led to this admission. Patient confirms Dr. Jeanmarie Conway from [facility] as her primary care provider.    Patient shared using [facility] [employer] to obtain medications without difficulty and manages her own medications at home.   She shares that she was driving prior to this admission and is independent with self care as well. Husband will provide transportation to her doctors' appointments and he will be the primary caregiver at home. Patient mentioned having 4 daughters-in law and a daughter [name]) who can be able to assist with her care if needed.  Patient voiced understanding to call primary care provider's office for a post discharge follow-up appointment within a week or sooner if needs arise. Patient letter provided for reminder.  She denies any further needs or concerns at this time.          For additional questions please contact:  [name], BSN, RN-BC [facility] PRIMARY CARE Navigator Cell: [phone]

## 2016-02-02 NOTE — Discharge Instructions (Signed)
Coronary Artery Bypass Grafting, Care After Refer to this sheet in the next few weeks. These instructions provide you with information on caring for yourself after your procedure. Your health care provider may also give you more specific instructions. Your treatment has been planned according to current medical practices, but problems sometimes occur. Call your health care provider if you have any problems or questions after your procedure. WHAT TO EXPECT AFTER THE PROCEDURE Recovery from surgery will be different for everyone. Some people feel well after 3 or 4 weeks, while for others it takes longer. After your procedure, it is typical to have the following:  Nausea and a lack of appetite.   Constipation.  Weakness and fatigue.   Depression or irritability.   Pain or discomfort at your incision site. HOME CARE INSTRUCTIONS  Take medicines only as directed by your health care provider. Do not stop taking medicines or start any new medicines without first checking with your health care provider.  Take your pulse as directed by your health care provider.  Perform deep breathing as directed by your health care provider. If you were given a device called an incentive spirometer, use it to practice deep breathing several times a day. Support your chest with a pillow or your arms when you take deep breaths or cough.  Keep incision areas clean, dry, and protected. Remove or change any bandages (dressings) only as directed by your health care provider. You may have skin adhesive strips over the incision areas. Do not take the strips off. They will fall off on their own.  Check incision areas daily for any swelling, redness, or drainage.  If incisions were made in your legs, do the following:  Avoid crossing your legs.   Avoid sitting for long periods of time. Change positions every 30 minutes.   Elevate your legs when you are sitting.  Wear compression stockings as directed by your  health care provider. These stockings help keep blood clots from forming in your legs.  Take showers once your health care provider approves. Until then, only take sponge baths. Pat incisions dry. Do not rub incisions with a washcloth or towel. Do not take baths, swim, or use a hot tub until your health care provider approves.  Eat foods that are high in fiber, such as raw fruits and vegetables, whole grains, beans, and nuts. Meats should be lean cut. Avoid canned, processed, and fried foods.  Drink enough fluid to keep your urine clear or pale yellow.  Weigh yourself every day. This helps identify if you are retaining fluid that may make your heart and lungs work harder.  Rest and limit activity as directed by your health care provider. You may be instructed to:  Stop any activity at once if you have chest pain, shortness of breath, irregular heartbeats, or dizziness. Get help right away if you have any of these symptoms.  Move around frequently for short periods or take short walks as directed by your health care provider. Increase your activities gradually. You may need physical therapy or cardiac rehabilitation to help strengthen your muscles and build your endurance.  Avoid lifting, pushing, or pulling anything heavier than 10 lb (4.5 kg) for at least 6 weeks after surgery.  Do not drive until your health care provider approves.  Ask your health care provider when you may return to work.  Ask your health care provider when you may resume sexual activity.  Keep all follow-up visits as directed by your health care  provider. This is important. SEEK MEDICAL CARE IF:  You have swelling, redness, increasing pain, or drainage at the site of an incision.  You have a fever.  You have swelling in your ankles or legs.  You have pain in your legs.   You gain 2 or more pounds (0.9 kg) a day.  You are nauseous or vomit.  You have diarrhea. SEEK IMMEDIATE MEDICAL CARE IF:  You have  chest pain that goes to your jaw or arms.  You have shortness of breath.   You have a fast or irregular heartbeat.   You notice a "clicking" in your breastbone (sternum) when you move.   You have numbness or weakness in your arms or legs.  You feel dizzy or light-headed.  MAKE SURE YOU:  Understand these instructions.  Will watch your condition.  Will get help right away if you are not doing well or get worse.   This information is not intended to replace advice given to you by your health care provider. Make sure you discuss any questions you have with your health care provider.   Document Released: 11/12/2004 Document Revised: 05/16/2014 Document Reviewed: 10/02/2012 Elsevier Interactive Patient Education 2016 [place].  Endoscopic Saphenous Vein Harvesting, Care After Refer to this sheet in the next few weeks. These instructions provide you with information on caring for yourself after your procedure. Your health care provider may also give you more specific instructions. Your treatment has been planned according to current medical practices, but problems sometimes occur. Call your health care provider if you have any problems or questions after your procedure. HOME CARE INSTRUCTIONS Medicine  Take whatever pain medicine your surgeon prescribes. Follow the directions carefully. Do not take over-the-counter pain medicine unless your surgeon says it is okay. Some pain medicine can cause bleeding problems for several weeks after surgery.  Follow your surgeon's instructions about driving. You will probably not be permitted to drive after heart surgery.  Take any medicines your surgeon prescribes. Any medicines you took before your heart surgery should be checked with your health care provider before you start taking them again. Wound care  If your surgeon has prescribed an elastic bandage or stocking, ask how long you should wear it.  Check the area around your surgical  cuts (incisions) whenever your bandages (dressings) are changed. Look for any redness or swelling.  You will need to return to have the stitches (sutures) or staples taken out. Ask your surgeon when to do that.  Ask your surgeon when you can shower or bathe. Activity  Try to keep your legs raised when you are sitting.  Do any exercises your health care providers have given you. These may include deep breathing exercises, coughing, walking, or other exercises. SEEK MEDICAL CARE IF:  You have any questions about your medicines.  You have more leg pain, especially if your pain medicine stops working.  New or growing bruises develop on your leg.  Your leg swells, feels tight, or becomes red.  You have numbness in your leg. SEEK IMMEDIATE MEDICAL CARE IF:  Your pain gets much worse.  Blood or fluid leaks from any of the incisions.  Your incisions become warm, swollen, or red.  You have chest pain.  You have trouble breathing.  You have a fever.  You have more pain near your leg incision. MAKE SURE YOU:  Understand these instructions.  Will watch your condition.  Will get help right away if you are not doing well or  get worse.   This information is not intended to replace advice given to you by your health care provider. Make sure you discuss any questions you have with your health care provider.   Document Released: 01/05/2011 Document Revised: 05/16/2014 Document Reviewed: 01/05/2011 Elsevier Interactive Patient Education [employer].

## 2016-02-02 NOTE — Discharge Summary (Signed)
Physician Discharge Summary  [place] ID: [name] MRN: [id number] DOB/AGE: Feb 23, 1946 70 y.o.  Admit date: 01/27/2016 Discharge date: 02/03/2016  Admission Diagnoses:  [facility] Active Problem List   Diagnosis Date Noted  . NSTEMI (non-ST elevated myocardial infarction) ([place]) 01/27/2016  . DM (diabetes mellitus), type 2, uncontrolled ([place]) 12/15/2015  . Trochanteric bursitis of left hip 12/15/2015  . S/P lumbar spinal fusion 06/10/2015  . Paresthesias 04/01/2015  . Osteoporosis 03/31/2015  . Hearing loss 06/25/2014  . Itch 06/25/2014  . Insomnia 06/25/2014  . Hair thinning 06/25/2014  . Muscle spasm 10/08/2013  . Biceps tendon rupture, proximal 11/24/2012  . Hyperlipemia   . GERD (gastroesophageal reflux disease)   . Diabetes mellitus type 2, controlled ([place])   . Essential hypertension    Discharge Diagnoses:   [facility]way Active Problem List   Diagnosis Date Noted  . S/P CABG x 3 02/02/2016  . NSTEMI (non-ST elevated myocardial infarction) ([place]) 01/27/2016  . DM (diabetes mellitus), type 2, uncontrolled ([place]) 12/15/2015  . Trochanteric bursitis of left hip 12/15/2015  . S/P lumbar spinal fusion 06/10/2015  . Paresthesias 04/01/2015  . Osteoporosis 03/31/2015  . Hearing loss 06/25/2014  . Itch 06/25/2014  . Insomnia 06/25/2014  . Hair thinning 06/25/2014  . Muscle spasm 10/08/2013  . Biceps tendon rupture, proximal 11/24/2012  . Hyperlipemia   . GERD (gastroesophageal reflux disease)   . Diabetes mellitus type 2, controlled ([place])   . Essential hypertension    Discharged Condition: good  History of Present Illness:  [name] is a 70 yo female with known history of Insulin dependent Type II DM, hypertension, and dyslipidemia.  She developed sudden onset of substernal chest pain with radiation to both arms on Kayla evening of 01/26/2016.  She took an ASA with some improvement in her chest pain.  However, Kayla pain again  worsened at which time [name] took one her  husbands NTG with resolution of her pain.  She knew something was wrong with her heart and presented to Kayla ED for evaluation.  Workup in Kayla ED showed ST depression on EKG.  Initial troponin was negative, but she was later ruled in for a NSTEMI.  She continued to have chest pain and frequent PVCS and was ultimately taken to Kayla catheterization lab and was found to have 3 vessel CAD.  It was felt coronary bypass grafting would be indicated and TCTS consult was obtained.  Hospital Course:   [name] remained chest pain free after her cardiac catheterization.  She was evaluated by Dr. Roxan Hockey who was in agreement [name] would benefit from coronary bypass grafting.  Kayla risks and benefits of Kayla procedure were explained to [name] and she was agreeable to proceed.  She was taken to Kayla operating room on 01/29/2016.  She underwent CABG x 3 utilizing LIMA to LAD, SVG to Ramus Intermediate, and SVG to distal RCA.  She also underwent endoscopic saphenous vein harvest of Kayla right leg.  She tolerated Kayla procedure without difficulty and was taken to Kayla SICU in stable condition.  She was extubated Kayla evening of surgery.  During her stay in Kayla SICU Kayla patients chest tubes and arterial lines were removed without difficulty.  She was diuresed for hypervolemia.  She was maintaining NSR, but continued to have frequent PVCs which were occurring prior to surgery.  Her beta blocker was titrated as tolerated.  She was ambulating without difficulty and felt medically stable for transfer to Kayla telemetry unit on  POD #4.  [name] continues to progress.  She continues to have PVCS, but is otherwise maintaining NSR.  Her BB was again increased.  Her pacing wires have been removed without difficulty.  She is responding well to diuretics.  She is coughing and was started on Mucinex to help with clearing of Mucous.  She is tolerating a diet.  She is ambulating without much difficulty.  She is felt medically stable  for discharge home today.      Significant Diagnostic Studies: angiography:    Prox RCA lesion, 75 %stenosed.  Ramus lesion, 95 %stenosed.  Prox LAD to Mid LAD lesion, 90 %stenosed.  Mid RCA to Dist RCA lesion, 100 %stenosed.  Dist RCA lesion, 100 %stenosed.  Kayla left ventricular ejection fraction is 45-50% by visual estimate.  Kayla left ventricular systolic function is normal.  LV end diastolic pressure is mildly elevated.  Treatments: surgery:   Median sternotomy, extracorporeal circulation, coronary artery bypass grafting x3 (left internal mammary artery to left anterior descending, saphenous vein graft to ramus intermedius, saphenous vein graft to distal right coronary).  Disposition: 01-Home or Self Care   Discharge Medications:  [name] has been discharged on:   1.Beta Blocker:  Yes [ x  ]                              No   [   ]                              If No, reason:  2.Ace Inhibitor/ARB: Yes [   ]                                     No  [ x   ]                                     If No, reason: Labile BP  3.Statin:   Yes [ x  ]                  No  [   ]                  If No, reason:  4.Ecasa:  Yes  [ x  ]                  No   [   ]                  If No, reason:      Medication List    STOP taking these medications   Turmeric 500 MG Caps     TAKE these medications   acetaminophen 325 MG tablet Commonly known as:  TYLENOL Take 2 tablets (650 mg total) by mouth every 6 (six) hours as needed for mild pain.   AMBULATORY NON FORMULARY MEDICATION One Touch Ultra Blue Test Strips Sig: Use to test blood sugar three times daily to control blood sugar Dx: E11.9   aspirin 325 MG EC tablet Take 1 tablet (325 mg total) by mouth daily.   atorvastatin 20 MG tablet Commonly known as:  LIPITOR Take 1 tablet (20 mg total) by mouth daily at 6  PM.   Biotin 10000 MCG Tabs Take 10,000 mcg by mouth every morning.   calcium carbonate 600 MG Tabs  tablet Commonly known as:  OS-CAL Take 600 mg by mouth 2 (two) times daily with a meal.   cholecalciferol 1000 units tablet Commonly known as:  VITAMIN D Take 1,000 Units by mouth daily.   ciprofloxacin 250 MG tablet Commonly known as:  CIPRO Take 250 mg by mouth 2 (two) times daily as needed (to prevent UTIs). Take one tablet after intercourse to prevent urinary tract infection   dextromethorphan-guaiFENesin 30-600 MG 12hr tablet Commonly known as:  MUCINEX DM Take 1 tablet by mouth 2 (two) times daily as needed for cough.   fish oil-omega-3 fatty acids 1000 MG capsule Take 1 g by mouth daily.   fluticasone 50 MCG/ACT nasal spray Commonly known as:  FLONASE Place 2 sprays into both nostrils 2 (two) times daily. Decrease to 2 sprays/nostril daily after 5 days What changed:  when to take this  reasons to take this  additional instructions   furosemide 40 MG tablet Commonly known as:  LASIX Take 1 tablet (40 mg total) by mouth daily. For 7  Days   Glucosamine 500 MG Caps Take 2,000 mg by mouth 2 (two) times daily.   glucose blood test strip Use as instructed to test blood sugar three times daily  Dx  250.00   INSULIN SYRINGE .5CC/31GX5/16" 31G X 5/16" 0.5 ML Misc Use daily with Kayla administration of insulin E11.9   LEVEMIR 100 UNIT/ML injection Generic drug:  insulin detemir Inject 45 Units into Kayla skin at bedtime.   MAGNESIUM PO Take 500 mg by mouth 2 (two) times daily in Kayla am and at bedtime..   meloxicam 15 MG tablet Commonly known as:  MOBIC One daily to help arthritis and bursitis What changed:  how much to take  how to take this  when to take this  additional instructions   metFORMIN 1000 MG tablet Commonly known as:  GLUCOPHAGE Take one tablet by mouth twice daily to treat diabetes   Metoprolol Tartrate 37.5 MG Tabs Take 37.5 mg by mouth 2 (two) times daily.   multivitamin with minerals tablet Take 1 tablet by mouth daily.   omeprazole  20 MG capsule Commonly known as:  PRILOSEC TAKE ONE CAPSULE BY MOUTH DAILY   oxyCODONE 5 MG immediate release tablet Commonly known as:  Oxy IR/ROXICODONE Take 1-2 tablets (5-10 mg total) by mouth every 4 (four) hours as needed for moderate pain or severe pain.   potassium chloride SA 20 MEQ tablet Commonly known as:  K-DUR,KLOR-CON Take 1 tablet (20 mEq total) by mouth daily.   PROBIOTIC DAILY PO Take 1 capsule by mouth daily. Digestive Advantage Probiotic   SYSTANE OP Place 1 drop into both eyes 2 (two) times daily.   VITAMIN B 12 PO Take 1,000 mcg by mouth daily.      Follow-up Information    Melrose Nakayama, MD Follow up on 03/01/2016.   Specialty:  Cardiothoracic Surgery Why:  Appointment is at 10:30, please get CXR at 10:00 at [facility] located on first floor of our office building Contact information: [place] [address] [phone]           Signed: Ellwood Handler 02/03/2016, 8:37 AM

## 2016-02-02 NOTE — Progress Notes (Addendum)
      [address]       [place] [address]             [phone]      5 Days Post-Op Procedure(s) (LRB): CORONARY ARTERY BYPASS GRAFTING (CABG) x 3 USING RIGHT LEG GREATER SAPHENOUS VEIN GRAFT (N/A) TRANSESOPHAGEAL ECHOCARDIOGRAM (TEE) (N/A) ENDOVEIN HARVEST OF GREATER SAPHENOUS VEIN (Right)   Subjective:  Ms. [name] complains of having a lot of mucous.  She asks if she can have something for this.  She is ambulating.  + BM  Objective: Vital signs in last 24 hours: Temp:  [97.8 F (36.6 C)-98.6 F (37 C)] 98.4 F (36.9 C) (09/26 0330) Pulse Rate:  [70-86] 76 (09/26 0330) Cardiac Rhythm: Normal sinus rhythm (09/26 0700) Resp:  [16-22] 16 (09/26 0330) BP: (102-126)/(52-86) 112/52 (09/26 0330) SpO2:  [94 %-100 %] 94 % (09/26 0330) Weight:  [157 lb 3.2 oz (71.3 kg)] 157 lb 3.2 oz (71.3 kg) (09/26 0330)  Intake/Output from previous day: 09/25 0701 - 09/26 0700 In: 360 [P.O.:360] Out: 800 [Urine:800]  General appearance: alert, cooperative and no distress Heart: regular rate and rhythm Lungs: clear to auscultation bilaterally Abdomen: soft, non-tender; bowel sounds normal; no masses,  no organomegaly Extremities: edema trace Wound: clean and dry  Lab Results:  Recent Labs  01/31/16 0507 02/01/16 0156  WBC 9.6 6.6  HGB 9.6* 9.2*  HCT 30.3* 29.5*  PLT 126* 149*   BMET:  Recent Labs  01/31/16 0507 02/01/16 0156  NA 135 138  K 4.4 3.6  CL 104 108  CO2 23 24  GLUCOSE 148* 100*  BUN 12 10  CREATININE 0.54 0.48  CALCIUM 8.2* 8.2*    PT/INR: No results for input(s): LABPROT, INR in the last 72 hours. ABG    Component Value Date/Time   PHART 7.380 01/28/2016 1948   HCO3 19.9 (L) 01/28/2016 1948   TCO2 24 01/29/2016 1839   ACIDBASEDEF 4.0 (H) 01/28/2016 1948   O2SAT 94.0 01/28/2016 1948   CBG (last 3)   Recent Labs  02/01/16 1642 02/01/16 2027 02/02/16 0619  GLUCAP 105* 141* 92    Assessment/Plan: S/P Procedure(s)  (LRB): CORONARY ARTERY BYPASS GRAFTING (CABG) x 3 USING RIGHT LEG GREATER SAPHENOUS VEIN GRAFT (N/A) TRANSESOPHAGEAL ECHOCARDIOGRAM (TEE) (N/A) ENDOVEIN HARVEST OF GREATER SAPHENOUS VEIN (Right)  1. CV- NSR, but continues to have PVCs- will titrate BB to 37.5 mg BID 2. Pulm- no acute issues, continue IS,   + productive cough.[name] KitchenMarland KitchenMucinex 3. Renal- creatinine stable, hypokalemic yesterday, supplementation ordered, remains hypervolemic, continue Lasix, will get BMET tomorrow 4. DM- sugars better controlled, continue current regimen 5. Dispo- patient stable, continues to have PVC, will increase BB, d/c EPW.. If remains stable possibly ready for d/c in AM   LOS: 6 days    BARRETT, ERIN 02/02/2016 Patient seen and examined, agree with above She was having a lot of PVCs preop, not a concern unless she has runs or multifocal  Remo Lipps C. Roxan Hockey, MD [facility] [phone]

## 2016-02-02 NOTE — Progress Notes (Signed)
CARDIAC REHAB PHASE I   PRE:  Rate/Rhythm: 67 SR  BP:  Supine:   Sitting: 124/61  Standing:    SaO2: 94-99%RA  MODE:  Ambulation: 550 ft   POST:  Rate/Rhythm: 71 SR  BP:  Supine:   Sitting: 120/52  Standing:    SaO2: 95%RA 1321-1355 Pt assisted to bathroom and then walked 550 ft on RA with rolling walker and asst x 1. Gait steady. Has walker at home. To recliner after walk with call bell. Tolerated well.   Graylon Good, RN BSN  02/02/2016 1:52 PM

## 2016-02-02 NOTE — Progress Notes (Signed)
Patient education completed for removal of pacer wires patient verbalised understanding 4 pacer wires removed as ordered will continue to monitor

## 2016-02-02 NOTE — Progress Notes (Addendum)
Patient reported three loose stools in the past hour and a half. Ellwood Handler, PA notified. No new orders received. Stool sample will be obtained.

## 2016-02-03 LAB — GLUCOSE, CAPILLARY
GLUCOSE-CAPILLARY: 62 mg/dL — AB (ref 65–99)
GLUCOSE-CAPILLARY: 169 mg/dL — AB (ref 65–99)

## 2016-02-03 MED ORDER — FUROSEMIDE 40 MG PO TABS: 40.0000 mg | ORAL_TABLET | Freq: Every day | ORAL | 0 refills | Status: DC

## 2016-02-03 MED ORDER — ACETAMINOPHEN 325 MG PO TABS: 650.0000 mg | ORAL_TABLET | Freq: Four times a day (QID) | ORAL | Status: DC | PRN

## 2016-02-03 MED ORDER — ATORVASTATIN CALCIUM 20 MG PO TABS: 20.0000 mg | ORAL_TABLET | Freq: Every day | ORAL | 3 refills | Status: DC

## 2016-02-03 MED ORDER — ASPIRIN 325 MG PO TBEC: 325.0000 mg | DELAYED_RELEASE_TABLET | Freq: Every day | ORAL | 0 refills | Status: DC

## 2016-02-03 MED ORDER — POTASSIUM CHLORIDE CRYS ER 20 MEQ PO TBCR: 20.0000 meq | EXTENDED_RELEASE_TABLET | Freq: Every day | ORAL | 0 refills | Status: DC

## 2016-02-03 MED ORDER — METOPROLOL TARTRATE 37.5 MG PO TABS: 37.5000 mg | ORAL_TABLET | Freq: Two times a day (BID) | ORAL | 3 refills | Status: DC

## 2016-02-03 MED ORDER — OXYCODONE HCL 5 MG PO TABS: 5.0000 mg | ORAL_TABLET | ORAL | 0 refills | Status: DC | PRN

## 2016-02-03 MED ORDER — DM-GUAIFENESIN ER 30-600 MG PO TB12: 1.0000 | ORAL_TABLET | Freq: Two times a day (BID) | ORAL | Status: DC | PRN

## 2016-02-03 MED FILL — Dexmedetomidine HCl in NaCl 0.9% IV Soln 400 MCG/100ML: INTRAVENOUS | Qty: 100 | Status: AC

## 2016-02-03 NOTE — Progress Notes (Signed)
Pt has been discharged home with husband. IV and telemetry box were removed. Pt received discharge instructions and all questions were answered. Pt received prescriptions for her discharge medications and was instructed to get them filled at her pharmacy. Pt verbalized understanding. Pt left with all of her belongings. Pt left the unit via wheelchair and was accompanied by pt's husband and a Actor. Pt was in no distress at time of discharge.   Grant Fontana BSN, RN

## 2016-02-03 NOTE — Progress Notes (Signed)
      [address]       [place] [address]             [phone]      6 Days Post-Op Procedure(s) (LRB): CORONARY ARTERY BYPASS GRAFTING (CABG) x 3 USING RIGHT LEG GREATER SAPHENOUS VEIN GRAFT (N/A) TRANSESOPHAGEAL ECHOCARDIOGRAM (TEE) (N/A) ENDOVEIN HARVEST OF GREATER SAPHENOUS VEIN (Right)   Subjective:  [name] has no complaints this morning.  She had a few episodes of diarrhea yesterday evening which have resolved.  She states she wants to go home.  Objective: Vital signs in last 24 hours: Temp:  [98.4 F (36.9 C)-99.3 F (37.4 C)] 98.4 F (36.9 C) (09/27 0428) Pulse Rate:  [69-80] 69 (09/27 0428) Cardiac Rhythm: Normal sinus rhythm (09/26 1900) Resp:  [16-18] 16 (09/27 0428) BP: (111-132)/(48-65) 114/52 (09/27 0428) SpO2:  [94 %-98 %] 95 % (09/27 0428) Weight:  [153 lb 3.2 oz (69.5 kg)] 153 lb 3.2 oz (69.5 kg) (09/27 0230)  Intake/Output from previous day: 09/26 0701 - 09/27 0700 In: 480 [P.O.:480] Out: 1050 [Urine:1050]  General appearance: alert, cooperative and no distress Heart: regular rate and rhythm Lungs: clear to auscultation bilaterally Abdomen: soft, non-tender; bowel sounds normal; no masses,  no organomegaly Extremities: edema trace Wound: Ecchymotic RLE, incisions are clean and dry  Lab Results:  Recent Labs  02/01/16 0156  WBC 6.6  HGB 9.2*  HCT 29.5*  PLT 149*   BMET:  Recent Labs  02/01/16 0156  NA 138  K 3.6  CL 108  CO2 24  GLUCOSE 100*  BUN 10  CREATININE 0.48  CALCIUM 8.2*    PT/INR: No results for input(s): LABPROT, INR in the last 72 hours. ABG    Component Value Date/Time   PHART 7.380 01/28/2016 1948   HCO3 19.9 (L) 01/28/2016 1948   TCO2 24 01/29/2016 1839   ACIDBASEDEF 4.0 (H) 01/28/2016 1948   O2SAT 94.0 01/28/2016 1948   CBG (last 3)   Recent Labs  02/02/16 1632 02/02/16 2048 02/03/16 0640  GLUCAP 104* 118* 62*    Assessment/Plan: S/P Procedure(s) (LRB): CORONARY ARTERY  BYPASS GRAFTING (CABG) x 3 USING RIGHT LEG GREATER SAPHENOUS VEIN GRAFT (N/A) TRANSESOPHAGEAL ECHOCARDIOGRAM (TEE) (N/A) ENDOVEIN HARVEST OF GREATER SAPHENOUS VEIN (Right)  1. CV- NSR, continued PVCS which were present prior to surgery- will continue BB 2. Pulm- no acute issues, continue IS 3. Renal- remains stable, continue lasix for hypervolemia 4. DM- sugars better controlled, continue current regimen 5. GI- diarrhea yesterday, resolved 6. Dispo- patient stable, will d/c home today   LOS: 7 days    Ahmed Prima, Jaymason Ledesma 02/03/2016

## 2016-02-03 NOTE — Progress Notes (Signed)
CARDIAC REHAB PHASE I   Pt ambulating with no complaints, awaiting discharge, declines ambulation at this time. Cardiac surgery discharge education completed with pt and husband at bedside. Reviewed risk factors, IS, sternal precautions, activity progression, exercise, heart healthy diet, carb counting, daily weights, and phase 2 cardiac rehab. Pt verbalized understanding. Pt agrees to phase 2 cardiac rehab referral, will send to [facility] per pt request. Pt in recliner, watching cardiac surgery discharge video, call bell within reach.   [phone] Lenna Sciara, RN, BSN 02/03/2016 11:23 AM

## 2016-02-04 ENCOUNTER — Telehealth: Payer: Self-pay | Admitting: Internal Medicine

## 2016-02-16 ENCOUNTER — Encounter: Payer: Self-pay | Admitting: Internal Medicine

## 2016-02-16 ENCOUNTER — Ambulatory Visit (INDEPENDENT_AMBULATORY_CARE_PROVIDER_SITE_OTHER): Payer: Medicare Other | Admitting: Internal Medicine

## 2016-02-16 VITALS — BP 124/68 | HR 70 | Temp 98.4°F | Ht 62.0 in | Wt 160.0 lb

## 2016-02-16 DIAGNOSIS — I25118 Atherosclerotic heart disease of native coronary artery with other forms of angina pectoris: Secondary | ICD-10-CM | POA: Diagnosis not present

## 2016-02-16 DIAGNOSIS — Z951 Presence of aortocoronary bypass graft: Secondary | ICD-10-CM | POA: Diagnosis not present

## 2016-02-16 DIAGNOSIS — E1165 Type 2 diabetes mellitus with hyperglycemia: Secondary | ICD-10-CM

## 2016-02-16 DIAGNOSIS — E785 Hyperlipidemia, unspecified: Secondary | ICD-10-CM | POA: Diagnosis not present

## 2016-02-16 DIAGNOSIS — I10 Essential (primary) hypertension: Secondary | ICD-10-CM

## 2016-02-16 DIAGNOSIS — Z794 Long term (current) use of insulin: Secondary | ICD-10-CM | POA: Diagnosis not present

## 2016-02-16 DIAGNOSIS — E876 Hypokalemia: Secondary | ICD-10-CM

## 2016-02-16 DIAGNOSIS — IMO0001 Reserved for inherently not codable concepts without codable children: Secondary | ICD-10-CM

## 2016-02-16 DIAGNOSIS — I2510 Atherosclerotic heart disease of native coronary artery without angina pectoris: Secondary | ICD-10-CM | POA: Insufficient documentation

## 2016-02-16 MED ORDER — POTASSIUM CHLORIDE CRYS ER 20 MEQ PO TBCR: EXTENDED_RELEASE_TABLET | ORAL | 2 refills | Status: DC

## 2016-02-16 NOTE — Progress Notes (Signed)
Facility  [facility]    Place of Service:   OFFICE    Allergies  Allergen Reactions  . Kiwi Extract Anaphylaxis  . Tdap [Diphth-Acell Pertussis-Tetanus] Swelling    Swelling at injection site, gets very hot  . Tramadol Nausea And Vomiting  . Latex Itching, Dermatitis and Rash    Chief Complaint  Patient presents with  . Hospitalization Follow-up    was in hospital 01/27/16 to 02/03/16 for MI, s/p CABG x 3 02/02/16.  Here with husband    HPI:  Hospitalized 01/27/16 to 02/03/16 with NSTEMI. Under went CABG x 3 by Dr. Roxan Hockey.  Pain in the left upper quadrant for the last 4 days. No nausea. Denies difficulty breathing. No fever. Has Cipro too use "if needed" , but she is not taking it now.  S/P CABG x 3 - doing well in the immediate post operative state.  Coronary artery disease of native artery of native heart with stable angina pectoris ([place]) - no further chest pain.  Uncontrolled type 2 diabetes mellitus without complication, with long-term current use of insulin (HCC) - improved. Home glucose is now 90-120.  Essential hypertension - controlled.    Medications: Patient's Medications  New Prescriptions   No medications on file  Previous Medications   ACETAMINOPHEN (TYLENOL) 325 MG TABLET    Take 2 tablets (650 mg total) by mouth every 6 (six) hours as needed for mild pain.   AMBULATORY NON FORMULARY MEDICATION    One Touch Ultra Blue Test Strips Sig: Use to test blood sugar three times daily to control blood sugar Dx: E11.9   ASPIRIN EC 325 MG EC TABLET    Take 1 tablet (325 mg total) by mouth daily.   ATORVASTATIN (LIPITOR) 20 MG TABLET    Take 1 tablet (20 mg total) by mouth daily at 6 PM.   BIOTIN 56256 MCG TABS    Take 10,000 mcg by mouth every morning.   CALCIUM CARBONATE (OS-CAL) 600 MG TABS    Take 600 mg by mouth 2 (two) times daily with a meal.     CHOLECALCIFEROL (VITAMIN D) 1000 UNITS TABLET    Take 1,000 Units by mouth daily.     CIPROFLOXACIN (CIPRO) 250 MG  TABLET    Take 250 mg by mouth 2 (two) times daily as needed (to prevent UTIs). Take one tablet after intercourse to prevent urinary tract infection   CYANOCOBALAMIN (VITAMIN B 12 PO)    Take 1,000 mcg by mouth daily.     DEXTROMETHORPHAN-GUAIFENESIN (MUCINEX DM) 30-600 MG 12HR TABLET    Take 1 tablet by mouth 2 (two) times daily as needed for cough.   FISH OIL-OMEGA-3 FATTY ACIDS 1000 MG CAPSULE    Take 1 g by mouth daily.     FLUTICASONE (FLONASE) 50 MCG/ACT NASAL SPRAY    Place 2 sprays into both nostrils 2 (two) times daily. Decrease to 2 sprays/nostril daily after 5 days   FUROSEMIDE (LASIX) 40 MG TABLET    Take 1 tablet (40 mg total) by mouth daily. For 7  Days   GLUCOSAMINE 500 MG CAPS    Take 2,000 mg by mouth 2 (two) times daily.   GLUCOSE BLOOD TEST STRIP    Use as instructed to test blood sugar three times daily  Dx  250.00   INSULIN DETEMIR (LEVEMIR) 100 UNIT/ML INJECTION    Inject 45 Units into the skin at bedtime.    INSULIN SYRINGE-NEEDLE U-100 (INSULIN SYRINGE .5CC/31GX5/16") 31G X 5/16"  0.5 ML MISC    Use daily with the administration of insulin E11.9   MAGNESIUM PO    Take 500 mg by mouth 2 (two) times daily in the am and at bedtime..   MELOXICAM (MOBIC) 15 MG TABLET    One daily to help arthritis and bursitis   METFORMIN (GLUCOPHAGE) 1000 MG TABLET    Take one tablet by mouth twice daily to treat diabetes   METOPROLOL TARTRATE 37.5 MG TABS    Take 37.5 mg by mouth 2 (two) times daily.   MULTIPLE VITAMINS-MINERALS (MULTIVITAMIN WITH MINERALS) TABLET    Take 1 tablet by mouth daily.     OMEPRAZOLE (PRILOSEC) 20 MG CAPSULE    TAKE ONE CAPSULE BY MOUTH DAILY   OXYCODONE (OXY IR/ROXICODONE) 5 MG IMMEDIATE RELEASE TABLET    Take 1-2 tablets (5-10 mg total) by mouth every 4 (four) hours as needed for moderate pain or severe pain.   POLYETHYL GLYCOL-PROPYL GLYCOL (SYSTANE OP)    Place 1 drop into both eyes 2 (two) times daily.   POTASSIUM CHLORIDE SA (K-DUR,KLOR-CON) 20 MEQ TABLET     Take 1 tablet (20 mEq total) by mouth daily.   PROBIOTIC PRODUCT (PROBIOTIC DAILY PO)    Take 1 capsule by mouth daily. Digestive Advantage Probiotic  Modified Medications   No medications on file  Discontinued Medications   No medications on file    Review of Systems  Constitutional: Negative.   HENT: Positive for hearing loss and postnasal drip.   Eyes: Negative.   Respiratory: Negative.   Cardiovascular: Positive for chest pain. Negative for palpitations and leg swelling.       CABG x 3 01/28/16.  Gastrointestinal:       Occasional heartburn  Endocrine:       Diabetic  Musculoskeletal: Positive for arthralgias, back pain and myalgias. Negative for gait problem and joint swelling.       Pain left greater trochanter.  Skin:       Complains of thinning hair  Neurological:       Distal paresthesias of tingling and burning in the feet and lower legs  Hematological: Negative.   Psychiatric/Behavioral: Positive for sleep disturbance (insomnia).    Vitals:   02/16/16 1448  BP: 124/68  Pulse: 70  Temp: 98.4 F (36.9 C)  TempSrc: Oral  SpO2: 97%  Weight: 160 lb (72.6 kg)  Height: 5' 2"  (1.575 m)   Body mass index is 29.26 kg/m. Wt Readings from Last 3 Encounters:  02/16/16 160 lb (72.6 kg)  02/03/16 153 lb 3.2 oz (69.5 kg)  12/28/15 156 lb 12.8 oz (71.1 kg)      Physical Exam  Constitutional: She is oriented to person, place, and time. She appears well-developed and well-nourished. No distress.  HENT:  Head: Normocephalic and atraumatic.  Nose: Nose normal.  Bilateral hearing aids  Eyes: Conjunctivae and EOM are normal. Pupils are equal, round, and reactive to light.  Neck: Neck supple. No JVD present. No tracheal deviation present. No thyromegaly present.  Cardiovascular: Normal rate, regular rhythm, normal heart sounds and intact distal pulses.  Exam reveals no gallop and no friction rub.   No murmur heard. Pulmonary/Chest: Effort normal and breath sounds normal.  No respiratory distress. She has no wheezes. She has no rales.  Abdominal: Bowel sounds are normal. She exhibits no distension and no mass. There is no tenderness.  Musculoskeletal: Normal range of motion. She exhibits no edema or tenderness.  Patient has evidence of  rupture long head of the biceps proximally.in the right mid upper arm. It is not tender. Previous bruising is resolved. The muscle is balled up in the mid right upper arm. Tender to percussion in the lower back. Plan left greater trochanter to palpation.  Lymphadenopathy:    She has no cervical adenopathy.  Neurological: She is alert and oriented to person, place, and time. She has normal reflexes. No cranial nerve deficit. Coordination normal.  Skin: No rash noted. No erythema. No pallor.  Hair is quite thin over the scalp.  Psychiatric: She has a normal mood and affect. Her behavior is normal. Thought content normal.    Labs reviewed: Lab Summary Latest Ref Rng & Units 02/01/2016 01/31/2016 01/30/2016 01/29/2016 01/29/2016 01/29/2016  Hemoglobin 12.0 - 15.0 g/dL 9.2(L) 9.6(L) 9.2(L) 10.5(L) 9.9(L) 8.9(L)  Hematocrit 36.0 - 46.0 % 29.5(L) 30.3(L) 29.2(L) 31.0(L) 32.5(L) 28.8(L)  White count 4.0 - 10.5 K/uL 6.6 9.6 12.0(H) (None) 14.8(H) 10.6(H)  Platelet count 150 - 400 K/uL 149(L) 126(L) 103(L) (None) 120(L) 91(L)  Sodium 135 - 145 mmol/L 138 135 139 137 (None) 134(L)  Potassium 3.5 - 5.1 mmol/L 3.6 4.4 3.9 4.3 (None) 3.6  Calcium 8.9 - 10.3 mg/dL 8.2(L) 8.2(L) 8.7(L) (None) (None) 7.6(L)  Phosphorus - (None) (None) (None) (None) (None) (None)  Creatinine 0.44 - 1.00 mg/dL 0.48 0.54 0.55 0.50 0.67 0.56  AST - (None) (None) (None) (None) (None) (None)  Alk Phos - (None) (None) (None) (None) (None) (None)  Bilirubin - (None) (None) (None) (None) (None) (None)  Glucose 65 - 99 mg/dL 100(H) 148(H) 75 182(H) (None) 116(H)  Cholesterol - (None) (None) (None) (None) (None) (None)  HDL cholesterol - (None) (None) (None) (None) (None)  (None)  Triglycerides - (None) (None) (None) (None) (None) (None)  LDL Direct - (None) (None) (None) (None) (None) (None)  LDL Calc - (None) (None) (None) (None) (None) (None)  Total protein - (None) (None) (None) (None) (None) (None)  Albumin - (None) (None) (None) (None) (None) (None)  Some recent data might be hidden   Lab Results  Component Value Date   TSH 2.150 06/23/2014   Lab Results  Component Value Date   BUN 10 02/01/2016   BUN 12 01/31/2016   BUN 8 01/30/2016   Lab Results  Component Value Date   HGBA1C 8.4 (H) 01/28/2016   HGBA1C 9.7 (H) 12/11/2015   HGBA1C 9.3 (H) 07/24/2015    Assessment/Plan  1. Coronary artery disease of native artery of native heart with stable angina pectoris ([place]) stable  2. S/P CABG x 3 Coping well  3. Uncontrolled type 2 diabetes mellitus without complication, with long-term current use of insulin (HCC) -A1c, BMP in Dec. 2017  4. Essential hypertension controlled  5. Hypokalemia - potassium chloride SA (K-DUR,KLOR-CON) 20 MEQ tablet; One daily for potassium supplement  Dispense: 90 tablet; Refill: 2  6. Controlled type 2 diabetes mellitus with hyperglycemia, with long-term current use of insulin (HCC) -continue current dose insulin and metformin  7. Hyperlipidemia, unspecified hyperlipidemia type - Lipid panel; Future

## 2016-02-16 NOTE — Progress Notes (Signed)
Cardiology Office Note    Date:  02/18/2016   ID:  Jmya, Uliano 1945/07/04, MRN [id number]  PCP:  Jeanmarie Hubert, MD  Cardiologist:  Dr. Johnsie Cancel  CC: post hospital follow up  History of Present Illness:  [name] is a 70 y.o. female with a history of HTN, HLD, DMT2, GERD and recently diagnosed CAD s/p CABG who presents to clinic for follow up.   She previously had no cardiac history. She was admitted from 9/20-9/28/17. She presented with chest pain. Initial troponin was negative but she had ST depression on ECG. She eventually ruled in for NSTEMI (pk troponin 6.98) and was taken to cath lab. Cath on 01/27/16 showed recent total occlusion of the dominant RCA, high grade obstuction of the pLAD (collateralizes the RCA) and high grade obstruction of a moderate size RI, LVEF 45-50%. TEE showed EF 55-60%. Due to ACS presentation and critical nature of the disease she was referred for expedited CABG. She was taken to the operating room on 01/29/2016 for CABG x 3 utilizing LIMA to LAD, SVG to Ramus Intermediate, and SVG to distal RCA.  She also underwent endoscopic saphenous vein harvest of the right leg. Her post op course was unremarkable except for frequent PVCs which were present prior to her heart cath. Her BB was titrated.   Today she presents to clinic for follow up. She has had some LE edema since surgery and weigh gain. She stopped the lasix 43m daily 7 days after the surgery. She has been sleeping on one pillow with no problems. No SOB or orthopnea. No CP or SOB. Some pain in her lower rib cage that feels better with some heating pads. She does feel "wiped out" but has good and bad days. Has some numbness in the right upper thigh on the leg they took the vein graft. This intermittently comes and goes. She has been walking at least 7-10 minutes a couple times a day. Doing well with this. No dizziness or syncope. No blood in her stool or urine. Also having some cramping in her legs  which has been ongoing since before the surgery.     Past Medical History:  Diagnosis Date  . Allergy   . Arthritis    neck  . Cataract    bilateral - MD monitoring cataracts  . Chronic kidney disease, stage I    DR OTTELIN  HX UTIS  . Cramp of limb   . Diabetes mellitus   . Dysphagia, unspecified(787.20)   . Dysuria   . Epistaxis   . GERD (gastroesophageal reflux disease)   . Heart murmur    NO CARDIOLOGIST  DX FOR YEARS ASYMPTOMATIC  . Lumbago   . Neoplasm of uncertain behavior of skin   . Nonspecific elevation of levels of transaminase or lactic acid dehydrogenase (LDH)   . Osteoarthrosis, unspecified whether generalized or localized, unspecified site   . Other and unspecified hyperlipidemia    diet controlled  . Pain in joint, shoulder region   . Paresthesias 04/01/2015  . Postablative ovarian failure   . Trochanteric bursitis of left hip 12/15/2015  . Type 2 diabetes mellitus without complication ([place]   . Unspecified essential hypertension    no meds    Past Surgical History:  Procedure Laterality Date  . CARDIAC CATHETERIZATION N/A 01/27/2016   Procedure: Left Heart Cath and Coronary Angiography;  Surgeon: HBelva Crome MD;  [facility];  Service: Cardiovascular;  Laterality: N/A;  .  COLONOSCOPY  2012   [name] - polyp  . CORONARY ARTERY BYPASS GRAFT N/A 01/28/2016   Procedure: CORONARY ARTERY BYPASS GRAFTING (CABG) x 3 USING RIGHT LEG GREATER SAPHENOUS VEIN GRAFT;  Surgeon: Melrose Nakayama, MD;  Location: [facility];  Service: Open Heart Surgery;  Laterality: N/A;  . ENDOVEIN HARVEST OF GREATER SAPHENOUS VEIN Right 01/28/2016   Procedure: ENDOVEIN HARVEST OF GREATER SAPHENOUS VEIN;  Surgeon: Melrose Nakayama, MD;  Location: [facility];  Service: Open Heart Surgery;  Laterality: Right;  . MAXIMUM ACCESS (MAS)POSTERIOR LUMBAR INTERBODY FUSION (PLIF) 1 LEVEL Left 06/10/2015   Procedure: FOR MAXIMUM ACCESS (MAS) POSTERIOR LUMBAR INTERBODY FUSION (PLIF)  LUMBAR THREE-FOUR EXTRAFORAMINAL MICRODISCECTOMY LUMBAR FIVE-SACRAL ONE LEFT;  Surgeon: Eustace Moore, MD;  Location: [facility];  Service: Neurosurgery;  Laterality: Left;  . TEE WITHOUT CARDIOVERSION N/A 01/28/2016   Procedure: TRANSESOPHAGEAL ECHOCARDIOGRAM (TEE);  Surgeon: Melrose Nakayama, MD;  Location: [facility];  Service: Open Heart Surgery;  Laterality: N/A;  . TUBAL LIGATION  1982   Dr Connye Burkitt  . UPPER GASTROINTESTINAL ENDOSCOPY    . VAGINAL HYSTERECTOMY  1997   Dr Rande Lawman    Current Medications: Outpatient Medications Prior to Visit  Medication Sig Dispense Refill  . acetaminophen (TYLENOL) 325 MG tablet Take 2 tablets (650 mg total) by mouth every 6 (six) hours as needed for mild pain.    [name] Kitchen AMBULATORY NON FORMULARY MEDICATION One Touch Ultra Blue Test Strips Sig: Use to test blood sugar three times daily to control blood sugar Dx: E11.9 100 each 11  . aspirin EC 325 MG EC tablet Take 1 tablet (325 mg total) by mouth daily. 30 tablet 0  . Biotin 10000 MCG TABS Take 10,000 mcg by mouth every morning.    . calcium carbonate (OS-CAL) 600 MG TABS Take 600 mg by mouth 2 (two) times daily with a meal.      . cholecalciferol (VITAMIN D) 1000 UNITS tablet Take 1,000 Units by mouth daily.      . ciprofloxacin (CIPRO) 250 MG tablet Take 250 mg by mouth 2 (two) times daily as needed (to prevent UTIs). Take one tablet after intercourse to prevent urinary tract infection    . Cyanocobalamin (VITAMIN B 12 PO) Take 1,000 mcg by mouth daily.      [name] Kitchen dextromethorphan-guaiFENesin (MUCINEX DM) 30-600 MG 12hr tablet Take 1 tablet by mouth 2 (two) times daily as needed for cough.    . fish oil-omega-3 fatty acids 1000 MG capsule Take 1 g by mouth daily.      . Glucosamine 500 MG CAPS Take 2,000 mg by mouth 2 (two) times daily.    [name] Kitchen glucose blood test strip Use as instructed to test blood sugar three times daily  Dx  250.00 100 each 12  . insulin detemir (LEVEMIR) 100 UNIT/ML injection Inject 45 Units  into the skin at bedtime.     . Insulin Syringe-Needle U-100 (INSULIN SYRINGE .5CC/31GX5/16") 31G X 5/16" 0.5 ML MISC Use daily with the administration of insulin E11.9 100 each 11  . MAGNESIUM PO Take 500 mg by mouth 2 (two) times daily in the am and at bedtime..    . metFORMIN (GLUCOPHAGE) 1000 MG tablet Take one tablet by mouth twice daily to treat diabetes 180 tablet 3  . Metoprolol Tartrate 37.5 MG TABS Take 37.5 mg by mouth 2 (two) times daily. 60 tablet 3  . Multiple Vitamins-Minerals (MULTIVITAMIN WITH MINERALS) tablet Take 1 tablet by mouth daily.      [name] Kitchen  omeprazole (PRILOSEC) 20 MG capsule TAKE ONE CAPSULE BY MOUTH DAILY 90 capsule 0  . oxyCODONE (OXY IR/ROXICODONE) 5 MG immediate release tablet Take 1-2 tablets (5-10 mg total) by mouth every 4 (four) hours as needed for moderate pain or severe pain. 30 tablet 0  . Polyethyl Glycol-Propyl Glycol (SYSTANE OP) Place 1 drop into both eyes 2 (two) times daily.    . Probiotic Product (PROBIOTIC DAILY PO) Take 1 capsule by mouth daily. Digestive Advantage Probiotic    . atorvastatin (LIPITOR) 20 MG tablet Take 1 tablet (20 mg total) by mouth daily at 6 PM. 30 tablet 3  . fluticasone (FLONASE) 50 MCG/ACT nasal spray Place 2 sprays into both nostrils 2 (two) times daily. Decrease to 2 sprays/nostril daily after 5 days (Patient not taking: Reported on 02/18/2016) 16 g 2  . furosemide (LASIX) 40 MG tablet Take 1 tablet (40 mg total) by mouth daily. For 7  Days (Patient not taking: Reported on 02/18/2016) 7 tablet 0  . meloxicam (MOBIC) 15 MG tablet One daily to help arthritis and bursitis (Patient not taking: Reported on 02/18/2016) 30 tablet 5  . potassium chloride SA (K-DUR,KLOR-CON) 20 MEQ tablet One daily for potassium supplement (Patient not taking: Reported on 02/18/2016) 90 tablet 2   No facility-administered medications prior to visit.      Allergies:   Kiwi extract; Tdap [diphth-acell pertussis-tetanus]; Tramadol; and Latex   Social  History   Social History  . Marital status: Married    Spouse name: N/A  . Number of children: N/A  . Years of education: N/A   Social History Main Topics  . Smoking status: Never Smoker  . Smokeless tobacco: Never Used  . Alcohol use No  . Drug use: No  . Sexual activity: Yes    Partners: Male    Birth control/ protection: Post-menopausal, Surgical     Comment: Hysterectomy   Other Topics Concern  . None   Social History Narrative  . None     Family History:  The patient's family history includes Arthritis in her brother and sister; Cancer in her father; Heart disease in her maternal grandfather, maternal grandmother, paternal grandfather, and paternal grandmother; Liver cancer in her brother.     ROS:   Please see the history of present illness.    ROS All other systems reviewed and are negative.   PHYSICAL EXAM:   VS:  BP (!) 120/50   Pulse 77   Ht 5' 2"  (1.575 m)   Wt 160 lb (72.6 kg)   BMI 29.26 kg/m    GEN: Well nourished, well developed, in no acute distress  HEENT: normal  Neck: no JVD, carotid bruits, or masses Cardiac: RRR; no murmurs, rubs, or gallops, 2+ LE edema  Respiratory:  clear to auscultation bilaterally, normal work of breathing GI: soft, nontender, nondistended, + BS MS: no deformity or atrophy  Skin: warm and dry, no rash Neuro:  Alert and Oriented x 3, Strength and sensation are intact Psych: euthymic mood, full affect  Wt Readings from Last 3 Encounters:  02/18/16 160 lb (72.6 kg)  02/16/16 160 lb (72.6 kg)  02/03/16 153 lb 3.2 oz (69.5 kg)      Studies/Labs Reviewed:   EKG:  EKG is ordered today.  The ekg ordered today demonstrates NSR HR 77. Nonspecific ST changes similar to previous.   Recent Labs: 07/24/2015: ALT 42 01/29/2016: Magnesium 2.0 02/01/2016: BUN 10; Creatinine, Ser 0.48; Hemoglobin 9.2; Platelets 149; Potassium 3.6; Sodium 138  Lipid Panel    Component Value Date/Time   CHOL 198 12/11/2015 0927   CHOL 194  07/24/2015 0933   TRIG 72 12/11/2015 0927   HDL 78 12/11/2015 0927   HDL 65 07/24/2015 0933   CHOLHDL 2.5 12/11/2015 0927   VLDL 14 12/11/2015 0927   LDLCALC 106 12/11/2015 0927   LDLCALC 112 (H) 07/24/2015 0933    Additional studies/ records that were reviewed today include:  Conclusion 01/27/16   Prox RCA lesion, 75 %stenosed.  Ramus lesion, 95 %stenosed.  Prox LAD to Mid LAD lesion, 90 %stenosed.  Mid RCA to Dist RCA lesion, 100 %stenosed.  Dist RCA lesion, 100 %stenosed.  The left ventricular ejection fraction is 45-50% by visual estimate.  The left ventricular systolic function is normal.  LV end diastolic pressure is mildly elevated.    Acute coronary syndrome with recent total occlusion of the dominant RCA, high-grade obstruction in the proximal LAD which Collateralizes the RCA, and high-grade obstruction and a moderate size ramus intermedius branch.  Left ventricular systolic dysfunction with inferobasal moderate hypokinesis and EF of 45-50%. Upper normal LV EDP.   RECOMMENDATIONS:  Long discussion with the patient concerning treatment options which include multivessel PCI / stenting vs coronary bypass grafting with LIMA to the LAD. If PCI, RCA will require near full metal jacket. After discussion, the patient would like the approach that will give her the most durable long-term success.  IV nitroglycerin, IV fluid, high intensity statin therapy, aspirin therapy, and beta blocker therapy as tolerated.  Expedited CABG because of presentation and critical nature of disease.    TEE: 01/28/2016 LV EF: 55% -   65 Study Conclusions - Left ventricle: Systolic function was normal. The estimated   ejection fraction was in the range of 55% to 65%. Wall motion was   normal; there were no regional wall motion abnormalities. - Mitral valve: There was mild regurgitation. - Left atrium: No evidence of thrombus in the atrial cavity or   appendage. - Right atrium: No  evidence of thrombus in the atrial cavity or   appendage.    ASSESSMENT & PLAN:   CAD s/p CABG: continue ASA 348m daily for now. Consider ASA 887mdaily and Plavix 7535maily given ACS presentation (NSTEMI). Will defer to CTCS. Continue BB and statin   HTN: BP currently well controlled   HLD: continue atorvastatin. Only on 61m72mily. Most recent LDL 101. Goal <70. Will increase to 80mg32mly.   DMT2: most recent HgA1c 8.4. Continue home reigmen   GERD: continue PPI  PVCs: noted during admission. Continue BB  LE edema: she doesn't remember having very good UOP from 40mg 31mx daily. Will start lasix 60 x3 days and then go back to 40mg d40m. Will check a BMET today and in a couple weeks. She is already on Kdur 20Meq daily.   Medication Adjustments/Labs and Tests Ordered: Current medicines are reviewed at length with the patient today.  Concerns regarding medicines are outlined above.  Medication changes, Labs and Tests ordered today are listed in the Patient Instructions below. Patient Instructions  Medication Instructions:  Your physician has recommended you make the following change in your medication:  1.  INCREASE Atorvastatin to 80 mg taking 1 tablet daily 2.  START Lasix 40 mg taking 1 1/2 tablet X's 3 days then go down to 1 tablet daily   Labwork: TODAY: BMET 1 WEEK 02/25/16) BMET  Testing/Procedures: None ordered  Follow-Up: Your physician recommends that you schedule a  follow-up appointment in:  3 MONTHS WITH DR. Johnsie Cancel   Any Other Special Instructions Will Be Listed Below (If Applicable).   If you need a refill on your cardiac medications before your next appointment, please call your pharmacy.    Signed, Angelena Form, PA-C  02/18/2016 11:02 AM    [facility] [place], [place], [place]  [address] Phone: [phone]; Fax: [phone]

## 2016-02-18 ENCOUNTER — Ambulatory Visit (INDEPENDENT_AMBULATORY_CARE_PROVIDER_SITE_OTHER): Payer: Medicare Other | Admitting: Physician Assistant

## 2016-02-18 ENCOUNTER — Encounter: Payer: Self-pay | Admitting: Physician Assistant

## 2016-02-18 VITALS — BP 120/50 | HR 77 | Ht 62.0 in | Wt 160.0 lb

## 2016-02-18 DIAGNOSIS — I249 Acute ischemic heart disease, unspecified: Secondary | ICD-10-CM

## 2016-02-18 DIAGNOSIS — Z951 Presence of aortocoronary bypass graft: Secondary | ICD-10-CM | POA: Diagnosis not present

## 2016-02-18 DIAGNOSIS — I10 Essential (primary) hypertension: Secondary | ICD-10-CM

## 2016-02-18 DIAGNOSIS — E118 Type 2 diabetes mellitus with unspecified complications: Secondary | ICD-10-CM

## 2016-02-18 DIAGNOSIS — I2510 Atherosclerotic heart disease of native coronary artery without angina pectoris: Secondary | ICD-10-CM | POA: Diagnosis not present

## 2016-02-18 DIAGNOSIS — E785 Hyperlipidemia, unspecified: Secondary | ICD-10-CM

## 2016-02-18 DIAGNOSIS — I200 Unstable angina: Secondary | ICD-10-CM

## 2016-02-18 LAB — BASIC METABOLIC PANEL
BUN: 13 mg/dL (ref 7–25)
CHLORIDE: 102 mmol/L (ref 98–110)
CO2: 25 mmol/L (ref 20–31)
Calcium: 9 mg/dL (ref 8.6–10.4)
Creat: 0.57 mg/dL (ref 0.50–0.99)
Glucose, Bld: 112 mg/dL — ABNORMAL HIGH (ref 65–99)
POTASSIUM: 4.5 mmol/L (ref 3.5–5.3)
Sodium: 137 mmol/L (ref 135–146)

## 2016-02-18 MED ORDER — ATORVASTATIN CALCIUM 80 MG PO TABS: 80.0000 mg | ORAL_TABLET | Freq: Every day | ORAL | 3 refills | Status: DC

## 2016-02-18 MED ORDER — ATORVASTATIN CALCIUM 80 MG PO TABS
80.0000 mg | ORAL_TABLET | Freq: Every day | ORAL | 3 refills | Status: DC
Start: 2016-02-18 — End: 2016-03-08

## 2016-02-18 MED ORDER — FUROSEMIDE 40 MG PO TABS: ORAL_TABLET | ORAL | 3 refills | Status: DC

## 2016-02-18 NOTE — Patient Instructions (Addendum)
Medication Instructions:  Your physician has recommended you make the following change in your medication:  1.  INCREASE Atorvastatin to 80 mg taking 1 tablet daily 2.  START Lasix 40 mg taking 1 1/2 tablet X's 3 days then go down to 1 tablet daily   Labwork: TODAY: BMET 1 WEEK 02/25/16) BMET  Testing/Procedures: None ordered  Follow-Up: Your physician recommends that you schedule a follow-up appointment in:  [place] DR. Johnsie Cancel   Any Other Special Instructions Will Be Listed Below (If Applicable).   If you need a refill on your cardiac medications before your next appointment, please call your pharmacy.

## 2016-02-25 ENCOUNTER — Other Ambulatory Visit: Payer: Medicare Other | Admitting: *Deleted

## 2016-02-25 DIAGNOSIS — I249 Acute ischemic heart disease, unspecified: Secondary | ICD-10-CM | POA: Diagnosis not present

## 2016-02-25 DIAGNOSIS — Z951 Presence of aortocoronary bypass graft: Secondary | ICD-10-CM | POA: Diagnosis not present

## 2016-02-25 LAB — BASIC METABOLIC PANEL
BUN: 14 mg/dL (ref 7–25)
CALCIUM: 9.4 mg/dL (ref 8.6–10.4)
CO2: 23 mmol/L (ref 20–31)
CREATININE: 0.84 mg/dL (ref 0.50–0.99)
Chloride: 101 mmol/L (ref 98–110)
GLUCOSE: 97 mg/dL (ref 65–99)
POTASSIUM: 5 mmol/L (ref 3.5–5.3)
Sodium: 136 mmol/L (ref 135–146)

## 2016-02-26 ENCOUNTER — Ambulatory Visit (INDEPENDENT_AMBULATORY_CARE_PROVIDER_SITE_OTHER): Payer: Medicare Other

## 2016-02-26 DIAGNOSIS — Z23 Encounter for immunization: Secondary | ICD-10-CM

## 2016-02-29 ENCOUNTER — Other Ambulatory Visit: Payer: Self-pay | Admitting: Thoracic Surgery (Cardiothoracic Vascular Surgery)

## 2016-02-29 DIAGNOSIS — Z951 Presence of aortocoronary bypass graft: Secondary | ICD-10-CM

## 2016-03-01 ENCOUNTER — Ambulatory Visit (INDEPENDENT_AMBULATORY_CARE_PROVIDER_SITE_OTHER): Payer: Self-pay | Admitting: Thoracic Surgery (Cardiothoracic Vascular Surgery)

## 2016-03-01 ENCOUNTER — Ambulatory Visit
Admission: RE | Admit: 2016-03-01 | Discharge: 2016-03-01 | Disposition: A | Discharge status: Home / Self Care | Payer: Medicare Other | Source: Ambulatory Visit | Attending: Thoracic Surgery (Cardiothoracic Vascular Surgery) | Admitting: Thoracic Surgery (Cardiothoracic Vascular Surgery)

## 2016-03-01 ENCOUNTER — Encounter: Payer: Self-pay | Admitting: Thoracic Surgery (Cardiothoracic Vascular Surgery)

## 2016-03-01 ENCOUNTER — Encounter: Payer: Self-pay | Admitting: Interventional Cardiology

## 2016-03-01 ENCOUNTER — Other Ambulatory Visit: Payer: Self-pay | Admitting: *Deleted

## 2016-03-01 VITALS — BP 126/72 | HR 73 | Resp 16 | Ht 62.0 in | Wt 160.0 lb

## 2016-03-01 DIAGNOSIS — Z951 Presence of aortocoronary bypass graft: Secondary | ICD-10-CM

## 2016-03-01 DIAGNOSIS — E876 Hypokalemia: Secondary | ICD-10-CM

## 2016-03-01 DIAGNOSIS — R188 Other ascites: Secondary | ICD-10-CM

## 2016-03-01 DIAGNOSIS — R140 Abdominal distension (gaseous): Secondary | ICD-10-CM

## 2016-03-01 DIAGNOSIS — I3139 Other pericardial effusion (noninflammatory): Secondary | ICD-10-CM

## 2016-03-01 DIAGNOSIS — I313 Pericardial effusion (noninflammatory): Secondary | ICD-10-CM

## 2016-03-01 DIAGNOSIS — R0602 Shortness of breath: Secondary | ICD-10-CM | POA: Diagnosis not present

## 2016-03-01 LAB — COMPREHENSIVE METABOLIC PANEL
ALBUMIN: 2.9 g/dL — AB (ref 3.6–5.1)
ALK PHOS: 75 U/L (ref 33–130)
ALT: 31 U/L — AB (ref 6–29)
AST: 45 U/L — AB (ref 10–35)
BILIRUBIN TOTAL: 0.6 mg/dL (ref 0.2–1.2)
BUN: 13 mg/dL (ref 7–25)
CALCIUM: 9.8 mg/dL (ref 8.6–10.4)
CO2: 24 mmol/L (ref 20–31)
CREATININE: 0.83 mg/dL (ref 0.50–0.99)
Chloride: 103 mmol/L (ref 98–110)
GLUCOSE: 192 mg/dL — AB (ref 65–99)
Potassium: 4.3 mmol/L (ref 3.5–5.3)
SODIUM: 136 mmol/L (ref 135–146)
Total Protein: 6.7 g/dL (ref 6.1–8.1)

## 2016-03-01 MED ORDER — FUROSEMIDE 40 MG PO TABS: 40.0000 mg | ORAL_TABLET | Freq: Two times a day (BID) | ORAL | 3 refills | Status: DC

## 2016-03-01 NOTE — Progress Notes (Signed)
[address]       [place] [address]             [phone]       HPI: [name] returns for a scheduled postoperative follow-up visit.  She is a 70 year old woman who presented in September with a non-ST elevation MI. Catheterization showed three-vessel disease. She underwent coronary bypass grafting 3 on 01/28/2016. She had no significant issues postoperatively and was discharged on postoperative day #5.  Since discharge she's had some issues with lower extremity edema and left upper quadrant pain. Over the past week to 10 day she's noted progressive abdominal distention. She still has swelling in her legs as well. Her appetite is poor. She has been having normal bowel movements. She says applying heat to her left upper quadrant helps with the discomfort there. She has not had any anginal chest pain. She is only taking Tylenol for incisional pain. She also complains of numbness in her right thigh.  Past Medical History:  Diagnosis Date  . Allergy   . Arthritis    neck  . Cataract    bilateral - MD monitoring cataracts  . Chronic kidney disease, stage I    DR OTTELIN  HX UTIS  . Cramp of limb   . Diabetes mellitus   . Dysphagia, unspecified(787.20)   . Dysuria   . Epistaxis   . GERD (gastroesophageal reflux disease)   . Heart murmur    NO CARDIOLOGIST  DX FOR YEARS ASYMPTOMATIC  . Lumbago   . Neoplasm of uncertain behavior of skin   . Nonspecific elevation of levels of transaminase or lactic acid dehydrogenase (LDH)   . Osteoarthrosis, unspecified whether generalized or localized, unspecified site   . Other and unspecified hyperlipidemia    diet controlled  . Pain in joint, shoulder region   . Paresthesias 04/01/2015  . Postablative ovarian failure   . Trochanteric bursitis of left hip 12/15/2015  . Type 2 diabetes mellitus without complication ([place])   . Unspecified essential hypertension    no meds      Current Outpatient Prescriptions    Medication Sig Dispense Refill  . acetaminophen (TYLENOL) 325 MG tablet Take 2 tablets (650 mg total) by mouth every 6 (six) hours as needed for mild pain.    [name] Kitchen AMBULATORY NON FORMULARY MEDICATION One Touch Ultra Blue Test Strips Sig: Use to test blood sugar three times daily to control blood sugar Dx: E11.9 100 each 11  . aspirin EC 325 MG EC tablet Take 1 tablet (325 mg total) by mouth daily. 30 tablet 0  . atorvastatin (LIPITOR) 80 MG tablet Take 1 tablet (80 mg total) by mouth daily at 6 PM. 90 tablet 3  . Biotin 10000 MCG TABS Take 10,000 mcg by mouth every morning.    . calcium carbonate (OS-CAL) 600 MG TABS Take 600 mg by mouth 2 (two) times daily with a meal.      . cholecalciferol (VITAMIN D) 1000 UNITS tablet Take 1,000 Units by mouth daily.      . ciprofloxacin (CIPRO) 250 MG tablet Take 250 mg by mouth 2 (two) times daily as needed (to prevent UTIs). Take one tablet after intercourse to prevent urinary tract infection    . Cyanocobalamin (VITAMIN B 12 PO) Take 1,000 mcg by mouth daily.      [name] Kitchen dextromethorphan-guaiFENesin (MUCINEX DM) 30-600 MG 12hr tablet Take 1 tablet by mouth 2 (two) times daily as needed for cough.    [name] Kitchen  fish oil-omega-3 fatty acids 1000 MG capsule Take 1 g by mouth daily.      . fluticasone (FLONASE) 50 MCG/ACT nasal spray Place 2 sprays into both nostrils daily as needed for allergies or rhinitis.    . furosemide (LASIX) 40 MG tablet Take 1 1/2 tablet by mouth X's 3 days then go back to taking 1 tablet by mouth daily 92 tablet 3  . Glucosamine 500 MG CAPS Take 2,000 mg by mouth 2 (two) times daily.    [name] Kitchen glucose blood test strip Use as instructed to test blood sugar three times daily  Dx  250.00 100 each 12  . insulin detemir (LEVEMIR) 100 UNIT/ML injection Inject 45 Units into the skin at bedtime.     . Insulin Syringe-Needle U-100 (INSULIN SYRINGE .5CC/31GX5/16") 31G X 5/16" 0.5 ML MISC Use daily with the administration of insulin E11.9 100 each 11  . MAGNESIUM  PO Take 500 mg by mouth 2 (two) times daily in the am and at bedtime..    . meloxicam (MOBIC) 15 MG tablet Take 15 mg by mouth daily.    . metFORMIN (GLUCOPHAGE) 1000 MG tablet Take one tablet by mouth twice daily to treat diabetes 180 tablet 3  . Metoprolol Tartrate 37.5 MG TABS Take 37.5 mg by mouth 2 (two) times daily. 60 tablet 3  . Multiple Vitamins-Minerals (MULTIVITAMIN WITH MINERALS) tablet Take 1 tablet by mouth daily.      [name] Kitchen omeprazole (PRILOSEC) 20 MG capsule TAKE ONE CAPSULE BY MOUTH DAILY 90 capsule 0  . Polyethyl Glycol-Propyl Glycol (SYSTANE OP) Place 1 drop into both eyes 2 (two) times daily.    . potassium chloride SA (K-DUR,KLOR-CON) 20 MEQ tablet Take 20 mEq by mouth daily.    . Probiotic Product (PROBIOTIC DAILY PO) Take 1 capsule by mouth daily. Digestive Advantage Probiotic    . oxyCODONE (OXY IR/ROXICODONE) 5 MG immediate release tablet Take 1-2 tablets (5-10 mg total) by mouth every 4 (four) hours as needed for moderate pain or severe pain. (Patient not taking: Reported on 03/01/2016) 30 tablet 0   No current facility-administered medications for this visit.     Physical Exam BP 126/72   Pulse 73   Resp 16   Ht 5' 2"  (1.575 m)   Wt 160 lb (72.6 kg)   BMI 29.10 kg/m  70 year old woman in no acute distress Flat affect Alert and oriented 3 with no focal neurologic deficits No jugular venous distention Cardiac regular rate and rhythm with a 2/6 systolic murmur Lungs clear with equal breath sounds bilaterally Abdomen markedly distended, nontender 1+ pitting edema both lower extremities  Diagnostic Tests: CHEST  2 VIEW  COMPARISON:  Radiograph of January 31, 2016.  FINDINGS: The heart size and mediastinal contours are within normal limits. Both lungs are clear. Status post coronary artery bypass graft. No pneumothorax or pleural effusion is noted. The visualized skeletal structures are unremarkable.  IMPRESSION: No active cardiopulmonary  disease.   Electronically Signed   By: Marijo Conception, M.D.   On: 03/01/2016 10:12   Impression: 70 year old woman who is now about a month out from coronary bypass grafting 3. From a cardiac standpoint she seems to be doing well. She's having minimal incisional pain. However she has marked abdominal distention. This appears to mostly be ascites on exam. She has some peripheral edema but the abdominal distention is out of proportion.  He really think the reason why she would have major ascites. I'm going to check a comprehensive  metabolic panel on her. We will schedule her for an echocardiogram to rule out a pericardial effusion. I'm also schedule her for a CT of the abdomen and pelvis with oral contrast to see if there is any intra-abdominal source.  In the meantime I will increase her Lasix to 40 mg twice a day.  Plan: Increase Lasix to 40 mg twice a day Check CMET CT of abdomen and pelvis 2-D echocardiogram Return in one week.  Melrose Nakayama, MD [facility] [phone]

## 2016-03-06 ENCOUNTER — Emergency Department (HOSPITAL_COMMUNITY): Payer: Medicare Other

## 2016-03-06 ENCOUNTER — Emergency Department (HOSPITAL_COMMUNITY)
Admission: EM | Admit: 2016-03-06 | Discharge: 2016-03-06 | Disposition: A | Discharge status: Home / Self Care | Payer: Medicare Other | Attending: Emergency Medicine | Admitting: Emergency Medicine

## 2016-03-06 ENCOUNTER — Encounter (HOSPITAL_COMMUNITY): Payer: Self-pay | Admitting: *Deleted

## 2016-03-06 DIAGNOSIS — N181 Chronic kidney disease, stage 1: Secondary | ICD-10-CM | POA: Insufficient documentation

## 2016-03-06 DIAGNOSIS — R188 Other ascites: Secondary | ICD-10-CM | POA: Diagnosis not present

## 2016-03-06 DIAGNOSIS — Z951 Presence of aortocoronary bypass graft: Secondary | ICD-10-CM | POA: Insufficient documentation

## 2016-03-06 DIAGNOSIS — R140 Abdominal distension (gaseous): Secondary | ICD-10-CM | POA: Diagnosis present

## 2016-03-06 DIAGNOSIS — R197 Diarrhea, unspecified: Secondary | ICD-10-CM | POA: Diagnosis not present

## 2016-03-06 DIAGNOSIS — Z7984 Long term (current) use of oral hypoglycemic drugs: Secondary | ICD-10-CM | POA: Insufficient documentation

## 2016-03-06 DIAGNOSIS — K7460 Unspecified cirrhosis of liver: Secondary | ICD-10-CM | POA: Diagnosis not present

## 2016-03-06 DIAGNOSIS — E1122 Type 2 diabetes mellitus with diabetic chronic kidney disease: Secondary | ICD-10-CM | POA: Diagnosis not present

## 2016-03-06 DIAGNOSIS — I129 Hypertensive chronic kidney disease with stage 1 through stage 4 chronic kidney disease, or unspecified chronic kidney disease: Secondary | ICD-10-CM | POA: Diagnosis not present

## 2016-03-06 DIAGNOSIS — E8809 Other disorders of plasma-protein metabolism, not elsewhere classified: Secondary | ICD-10-CM

## 2016-03-06 DIAGNOSIS — Z7982 Long term (current) use of aspirin: Secondary | ICD-10-CM | POA: Insufficient documentation

## 2016-03-06 DIAGNOSIS — I2510 Atherosclerotic heart disease of native coronary artery without angina pectoris: Secondary | ICD-10-CM | POA: Diagnosis not present

## 2016-03-06 DIAGNOSIS — Z91040 Latex allergy status: Secondary | ICD-10-CM | POA: Diagnosis not present

## 2016-03-06 DIAGNOSIS — R0602 Shortness of breath: Secondary | ICD-10-CM | POA: Diagnosis not present

## 2016-03-06 DIAGNOSIS — R778 Other specified abnormalities of plasma proteins: Secondary | ICD-10-CM | POA: Diagnosis not present

## 2016-03-06 DIAGNOSIS — Z794 Long term (current) use of insulin: Secondary | ICD-10-CM | POA: Diagnosis not present

## 2016-03-06 LAB — BASIC METABOLIC PANEL
ANION GAP: 11 (ref 5–15)
BUN: 14 mg/dL (ref 6–20)
CALCIUM: 9.2 mg/dL (ref 8.9–10.3)
CO2: 21 mmol/L — ABNORMAL LOW (ref 22–32)
CREATININE: 0.91 mg/dL (ref 0.44–1.00)
Chloride: 102 mmol/L (ref 101–111)
GLUCOSE: 139 mg/dL — AB (ref 65–99)
Potassium: 3.6 mmol/L (ref 3.5–5.1)
Sodium: 134 mmol/L — ABNORMAL LOW (ref 135–145)

## 2016-03-06 LAB — CBC
HCT: 31.1 % — ABNORMAL LOW (ref 36.0–46.0)
HEMOGLOBIN: 9.5 g/dL — AB (ref 12.0–15.0)
MCH: 24.5 pg — AB (ref 26.0–34.0)
MCHC: 30.5 g/dL (ref 30.0–36.0)
MCV: 80.2 fL (ref 78.0–100.0)
PLATELETS: 209 10*3/uL (ref 150–400)
RBC: 3.88 MIL/uL (ref 3.87–5.11)
RDW: 15.9 % — ABNORMAL HIGH (ref 11.5–15.5)
WBC: 6 10*3/uL (ref 4.0–10.5)

## 2016-03-06 LAB — HEPATIC FUNCTION PANEL
ALK PHOS: 74 U/L (ref 38–126)
ALT: 35 U/L (ref 14–54)
AST: 59 U/L — ABNORMAL HIGH (ref 15–41)
Albumin: 2.8 g/dL — ABNORMAL LOW (ref 3.5–5.0)
TOTAL PROTEIN: 7.1 g/dL (ref 6.5–8.1)
Total Bilirubin: 0.2 mg/dL — ABNORMAL LOW (ref 0.3–1.2)

## 2016-03-06 LAB — I-STAT TROPONIN, ED: TROPONIN I, POC: 0.01 ng/mL (ref 0.00–0.08)

## 2016-03-06 LAB — APTT: APTT: 25 s (ref 24–36)

## 2016-03-06 LAB — BRAIN NATRIURETIC PEPTIDE: B Natriuretic Peptide: 307.8 pg/mL — ABNORMAL HIGH (ref 0.0–100.0)

## 2016-03-06 LAB — LIPASE, BLOOD: Lipase: 27 U/L (ref 11–51)

## 2016-03-06 LAB — PROTIME-INR
INR: 1.23
Prothrombin Time: 15.6 seconds — ABNORMAL HIGH (ref 11.4–15.2)

## 2016-03-06 MED ORDER — SODIUM CHLORIDE 0.9 % IV BOLUS (SEPSIS)
1000.0000 mL | Freq: Once | INTRAVENOUS | Status: AC
  Administered 2016-03-06: 1000 mL via INTRAVENOUS

## 2016-03-06 MED ORDER — ASPIRIN EC 325 MG PO TBEC
650.0000 mg | DELAYED_RELEASE_TABLET | Freq: Once | ORAL | Status: AC
  Administered 2016-03-06: 650 mg via ORAL
  Filled 2016-03-06: qty 2

## 2016-03-06 MED ORDER — SODIUM CHLORIDE 0.9 % IV BOLUS (SEPSIS): 500.0000 mL | Freq: Once | INTRAVENOUS | Status: DC

## 2016-03-06 MED ORDER — IOPAMIDOL (ISOVUE-300) INJECTION 61%
INTRAVENOUS | Status: AC
  Administered 2016-03-06: 100 mL
  Filled 2016-03-06: qty 100

## 2016-03-06 NOTE — ED Notes (Signed)
Papers reviewed and questions answered. Verbalizes intent to keep all follow up appointments

## 2016-03-06 NOTE — ED Provider Notes (Signed)
Care assumed from Dr. Stark Jock at [phone] with plan for f/u CT.   Results:  BP (!) 134/114   Pulse 82   Temp 97.9 F (36.6 C) (Oral)   Resp (!) 27   SpO2 100%   Results for orders placed or performed during the hospital encounter of 96/04/54  Basic metabolic panel  Result Value Ref Range   Sodium 134 (L) 135 - 145 mmol/L   Potassium 3.6 3.5 - 5.1 mmol/L   Chloride 102 101 - 111 mmol/L   CO2 21 (L) 22 - 32 mmol/L   Glucose, Bld 139 (H) 65 - 99 mg/dL   BUN 14 6 - 20 mg/dL   Creatinine, Ser 0.91 0.44 - 1.00 mg/dL   Calcium 9.2 8.9 - 10.3 mg/dL   GFR calc non Af Amer >60 >60 mL/min   GFR calc Af Amer >60 >60 mL/min   Anion gap 11 5 - 15  CBC  Result Value Ref Range   WBC 6.0 4.0 - 10.5 K/uL   RBC 3.88 3.87 - 5.11 MIL/uL   Hemoglobin 9.5 (L) 12.0 - 15.0 g/dL   HCT 31.1 (L) 36.0 - 46.0 %   MCV 80.2 78.0 - 100.0 fL   MCH 24.5 (L) 26.0 - 34.0 pg   MCHC 30.5 30.0 - 36.0 g/dL   RDW 15.9 (H) 11.5 - 15.5 %   Platelets 209 150 - 400 K/uL  Lipase, blood  Result Value Ref Range   Lipase 27 11 - 51 U/L  Brain natriuretic peptide  Result Value Ref Range   B Natriuretic Peptide 307.8 (H) 0.0 - 100.0 pg/mL  Hepatic function panel  Result Value Ref Range   Total Protein 7.1 6.5 - 8.1 g/dL   Albumin 2.8 (L) 3.5 - 5.0 g/dL   AST 59 (H) 15 - 41 U/L   ALT 35 14 - 54 U/L   Alkaline Phosphatase 74 38 - 126 U/L   Total Bilirubin 0.2 (L) 0.3 - 1.2 mg/dL   Bilirubin, Direct <0.1 (L) 0.1 - 0.5 mg/dL   Indirect Bilirubin NOT CALCULATED 0.3 - 0.9 mg/dL  APTT  Result Value Ref Range   aPTT 25 24 - 36 seconds  Protime-INR  Result Value Ref Range   Prothrombin Time 15.6 (H) 11.4 - 15.2 seconds   INR 1.23   I-stat troponin, ED  Result Value Ref Range   Troponin i, poc 0.01 0.00 - 0.08 ng/mL   Comment 3            Dg Chest 2 View  Result Date: 03/06/2016 CLINICAL DATA:  Diarrhea.  Shortness of breath. EXAM: CHEST  2 VIEW COMPARISON:  March 01, 2006 FINDINGS: The heart size and mediastinal  contours are within normal limits. Both lungs are clear. The visualized skeletal structures are unremarkable. IMPRESSION: No active cardiopulmonary disease. Electronically Signed   By: Dorise Bullion III M.D   On: 03/06/2016 14:11   Ct Abdomen Pelvis W Contrast  Result Date: 03/06/2016 CLINICAL DATA:  Diarrhea, abdominal distention EXAM: CT ABDOMEN AND PELVIS WITH CONTRAST TECHNIQUE: Multidetector CT imaging of the abdomen and pelvis was performed using the standard protocol following bolus administration of intravenous contrast. CONTRAST:  148m ISOVUE-300 IOPAMIDOL (ISOVUE-300) INJECTION 61% COMPARISON:  None. FINDINGS: Lower chest: Lung bases are clear. Hepatobiliary: Cirrhosis.  No suspicious/enhancing hepatic lesions. Gallbladder is unremarkable. No intrahepatic or extrahepatic ductal dilatation. Pancreas: Within normal limits. Spleen: Within normal limits for size. Adrenals/Urinary Tract: Adrenal glands are within normal limits. 6 mm left  lower pole renal calculus (series 2/image 41). Right kidney is within normal limits. No ureteral or bladder calculi.  No hydronephrosis. Bladder is within normal limits. Stomach/Bowel: Stomach is within normal limits. No evidence of bowel obstruction. Normal appendix (series 2/ image 63). Vascular/Lymphatic: No evidence of abdominal aortic aneurysm. Atherosclerotic calcifications of the abdominal aorta and branch vessels. Gastroesophageal and perigastric varices.  Portal vein is patent. No suspicious abdominopelvic lymphadenopathy. Reproductive: Status post hysterectomy. No adnexal masses. Other: Moderate to large abdominopelvic ascites. Small fat/ fluid containing periumbilical hernia (series 2/image 66). Musculoskeletal: Degenerative changes of the visualized thoracolumbar spine. Status post PLIF at L3-4. IMPRESSION: Cirrhosis.  No focal hepatic lesion is seen. Moderate to large abdominopelvic ascites. Gastroesophageal and perigastric varices.  Portal vein is patent.  Electronically Signed   By: Julian Hy M.D.   On: 03/06/2016 16:33    Radiology and laboratory examinations were reviewed by me and used in medical decision making if performed.   MDM:  Pt with progression of liver disease to cirrhosis with ascites. She is proteinemic but no frank liver failure with normal coags and no acute LFT elevation. No signs of SBP. This may be incidental finding but is likely resulting in at least the leg swelling and abdominal distention. Needs f/u with GI for further outpatient workup but no indication for admission currently and diarrhea is not persistent during ED course. Plan to follow up with PCP as needed and return precautions discussed for worsening or new concerning symptoms.   Diagnoses that have been ruled out:  None  Diagnoses that are still under consideration:  None  Final diagnoses:  Diarrhea, unspecified type  Proteins serum plasma low  Cirrhosis of liver with ascites, unspecified hepatic cirrhosis type (HCC)      Leo Grosser, MD 03/06/16 [phone]

## 2016-03-06 NOTE — ED Notes (Signed)
Called lab to confirm that they had a light green top to run the hepatic function panel.

## 2016-03-06 NOTE — ED Triage Notes (Signed)
Pt had OHS 5 weeks ago and since has had abdominal distention and reports diarrhea off and on for couple of weeks.  Increased swelling to LE and sob, on Lasix 47m.  Appears weak and pale

## 2016-03-06 NOTE — ED Provider Notes (Signed)
[facility] DEPT Provider Note   CSN: [id number] Arrival date & time: 03/06/16  1245     History   Chief Complaint Chief Complaint  Patient presents with  . Shortness of Breath  . Leg Swelling  . Diarrhea    HPI NICHOLLE FALZON is a 70 y.o. female.  Patient is a 70 year old female with past medical history of coronary artery disease status post CABG 36 weeks ago. She began 2 weeks ago with diarrhea and abdominal distention that is worsening. She denies significant discomfort, but does report distention. She denies any fevers or chills. She denies any chest pain. She does feel slightly short of breath and has slight swelling in her ankles.      Past Medical History:  Diagnosis Date  . Allergy   . Arthritis    neck  . Cataract    bilateral - MD monitoring cataracts  . Chronic kidney disease, stage I    DR OTTELIN  HX UTIS  . Cramp of limb   . Diabetes mellitus   . Dysphagia, unspecified(787.20)   . Dysuria   . Epistaxis   . GERD (gastroesophageal reflux disease)   . Heart murmur    NO CARDIOLOGIST  DX FOR YEARS ASYMPTOMATIC  . Lumbago   . Neoplasm of uncertain behavior of skin   . Nonspecific elevation of levels of transaminase or lactic acid dehydrogenase (LDH)   . Osteoarthrosis, unspecified whether generalized or localized, unspecified site   . Other and unspecified hyperlipidemia    diet controlled  . Pain in joint, shoulder region   . Paresthesias 04/01/2015  . Postablative ovarian failure   . Trochanteric bursitis of left hip 12/15/2015  . Type 2 diabetes mellitus without complication ([place])   . Unspecified essential hypertension    no meds    Patient Active Problem List   Diagnosis Date Noted  . Ascites 03/01/2016  . CAD (coronary artery disease) 02/16/2016  . Hypokalemia 02/16/2016  . S/P CABG x 3 02/02/2016  . NSTEMI (non-ST elevated myocardial infarction) ([place]) 01/27/2016  . DM (diabetes mellitus), type 2, uncontrolled ([place]) 12/15/2015  .  Trochanteric bursitis of left hip 12/15/2015  . S/P lumbar spinal fusion 06/10/2015  . Paresthesias 04/01/2015  . Osteoporosis 03/31/2015  . Hearing loss 06/25/2014  . Itch 06/25/2014  . Insomnia 06/25/2014  . Hair thinning 06/25/2014  . Muscle spasm 10/08/2013  . Biceps tendon rupture, proximal 11/24/2012  . Hyperlipemia   . GERD (gastroesophageal reflux disease)   . Diabetes mellitus type 2, controlled ([place])   . Essential hypertension     Past Surgical History:  Procedure Laterality Date  . CARDIAC CATHETERIZATION N/A 01/27/2016   Procedure: Left Heart Cath and Coronary Angiography;  Surgeon: Belva Crome, MD;  Location: [facility];  Service: Cardiovascular;  Laterality: N/A;  . COLONOSCOPY  2012   [facility] - polyp  . CORONARY ARTERY BYPASS GRAFT N/A 01/28/2016   Procedure: CORONARY ARTERY BYPASS GRAFTING (CABG) x 3 USING RIGHT LEG GREATER SAPHENOUS VEIN GRAFT;  Surgeon: Melrose Nakayama, MD;  Location: [facility];  Service: Open Heart Surgery;  Laterality: N/A;  . ENDOVEIN HARVEST OF GREATER SAPHENOUS VEIN Right 01/28/2016   Procedure: ENDOVEIN HARVEST OF GREATER SAPHENOUS VEIN;  Surgeon: Melrose Nakayama, MD;  Location: [facility];  Service: Open Heart Surgery;  Laterality: Right;  . MAXIMUM ACCESS (MAS)POSTERIOR LUMBAR INTERBODY FUSION (PLIF) 1 LEVEL Left 06/10/2015   Procedure: FOR MAXIMUM ACCESS (MAS) POSTERIOR LUMBAR INTERBODY FUSION (PLIF) LUMBAR THREE-FOUR  EXTRAFORAMINAL MICRODISCECTOMY LUMBAR FIVE-SACRAL ONE LEFT;  Surgeon: Eustace Moore, MD;  Location: [facility];  Service: Neurosurgery;  Laterality: Left;  . TEE WITHOUT CARDIOVERSION N/A 01/28/2016   Procedure: TRANSESOPHAGEAL ECHOCARDIOGRAM (TEE);  Surgeon: Melrose Nakayama, MD;  Location: [facility];  Service: Open Heart Surgery;  Laterality: N/A;  . TUBAL LIGATION  1982   Dr Connye Burkitt  . UPPER GASTROINTESTINAL ENDOSCOPY    . VAGINAL HYSTERECTOMY  1997   Dr Rande Lawman    OB History    No data available       Home  Medications    Prior to Admission medications   Medication Sig Start Date End Date Taking? Authorizing Provider  acetaminophen (TYLENOL) 325 MG tablet Take 2 tablets (650 mg total) by mouth every 6 (six) hours as needed for mild pain. 02/03/16   Erin R Barrett, PA-C  AMBULATORY NON FORMULARY MEDICATION One Touch Ultra Blue Test Strips Sig: Use to test blood sugar three times daily to control blood sugar Dx: E11.9 09/29/14   Estill Dooms, MD  aspirin EC 325 MG EC tablet Take 1 tablet (325 mg total) by mouth daily. 02/03/16   Erin R Barrett, PA-C  atorvastatin (LIPITOR) 80 MG tablet Take 1 tablet (80 mg total) by mouth daily at 6 PM. 02/18/16   Eileen Stanford, PA-C  Biotin 10000 MCG TABS Take 10,000 mcg by mouth every morning.    Historical Provider, MD  calcium carbonate (OS-CAL) 600 MG TABS Take 600 mg by mouth 2 (two) times daily with a meal.      Historical Provider, MD  cholecalciferol (VITAMIN D) 1000 UNITS tablet Take 1,000 Units by mouth daily.      Historical Provider, MD  ciprofloxacin (CIPRO) 250 MG tablet Take 250 mg by mouth 2 (two) times daily as needed (to prevent UTIs). Take one tablet after intercourse to prevent urinary tract infection    Historical Provider, MD  Cyanocobalamin (VITAMIN B 12 PO) Take 1,000 mcg by mouth daily.      Historical Provider, MD  dextromethorphan-guaiFENesin (MUCINEX DM) 30-600 MG 12hr tablet Take 1 tablet by mouth 2 (two) times daily as needed for cough. 02/03/16   Erin R Barrett, PA-C  fish oil-omega-3 fatty acids 1000 MG capsule Take 1 g by mouth daily.      Historical Provider, MD  fluticasone (FLONASE) 50 MCG/ACT nasal spray Place 2 sprays into both nostrils daily as needed for allergies or rhinitis.    Historical Provider, MD  furosemide (LASIX) 40 MG tablet Take 1 tablet (40 mg total) by mouth 2 (two) times daily. 03/01/16   Melrose Nakayama, MD  Glucosamine 500 MG CAPS Take 2,000 mg by mouth 2 (two) times daily.    Historical Provider, MD    glucose blood test strip Use as instructed to test blood sugar three times daily  Dx  250.00 09/04/13   Estill Dooms, MD  insulin detemir (LEVEMIR) 100 UNIT/ML injection Inject 45 Units into the skin at bedtime.     Historical Provider, MD  Insulin Syringe-Needle U-100 (INSULIN SYRINGE .5CC/31GX5/16") 31G X 5/16" 0.5 ML MISC Use daily with the administration of insulin E11.9 03/31/15   Estill Dooms, MD  MAGNESIUM PO Take 500 mg by mouth 2 (two) times daily in the am and at bedtime.[name] Kitchen    Historical Provider, MD  meloxicam (MOBIC) 15 MG tablet Take 15 mg by mouth daily.    Historical Provider, MD  metFORMIN (GLUCOPHAGE) 1000 MG tablet Take  one tablet by mouth twice daily to treat diabetes 05/05/15   Estill Dooms, MD  Metoprolol Tartrate 37.5 MG TABS Take 37.5 mg by mouth 2 (two) times daily. 02/03/16   Erin R Barrett, PA-C  Multiple Vitamins-Minerals (MULTIVITAMIN WITH MINERALS) tablet Take 1 tablet by mouth daily.      Historical Provider, MD  omeprazole (PRILOSEC) 20 MG capsule TAKE ONE CAPSULE BY MOUTH DAILY 12/21/15   Estill Dooms, MD  oxyCODONE (OXY IR/ROXICODONE) 5 MG immediate release tablet Take 1-2 tablets (5-10 mg total) by mouth every 4 (four) hours as needed for moderate pain or severe pain. Patient not taking: Reported on 03/01/2016 02/03/16   Erin R Barrett, PA-C  Polyethyl Glycol-Propyl Glycol (SYSTANE OP) Place 1 drop into both eyes 2 (two) times daily.    Historical Provider, MD  potassium chloride SA (K-DUR,KLOR-CON) 20 MEQ tablet Take 20 mEq by mouth daily.    Historical Provider, MD  Probiotic Product (PROBIOTIC DAILY PO) Take 1 capsule by mouth daily. Digestive Advantage Probiotic    Historical Provider, MD    Family History Family History  Problem Relation Age of Onset  . Cancer Father   . Arthritis Sister   . Arthritis Brother   . Heart disease Maternal Grandmother   . Heart disease Maternal Grandfather   . Heart disease Paternal Grandmother   . Heart disease  Paternal Grandfather   . Liver cancer Brother   . Colon cancer Neg Hx   . Esophageal cancer Neg Hx   . Rectal cancer Neg Hx   . Stomach cancer Neg Hx     Social History Social History  Substance Use Topics  . Smoking status: Never Smoker  . Smokeless tobacco: Never Used  . Alcohol use No     Allergies   Kiwi extract; Tdap [diphth-acell pertussis-tetanus]; Tramadol; and Latex   Review of Systems Review of Systems  All other systems reviewed and are negative.    Physical Exam Updated Vital Signs BP 130/70   Pulse 79   Temp 97.9 F (36.6 C) (Oral)   Resp 20   SpO2 95%   Physical Exam  Constitutional: She is oriented to person, place, and time. She appears well-developed and well-nourished. No distress.  HENT:  Head: Normocephalic and atraumatic.  Neck: Normal range of motion. Neck supple.  Cardiovascular: Normal rate and regular rhythm.  Exam reveals no gallop and no friction rub.   No murmur heard. Pulmonary/Chest: Effort normal and breath sounds normal. No respiratory distress. She has no wheezes.  Abdominal: Soft. Bowel sounds are normal. She exhibits no distension. There is tenderness.  There is generalized abdominal distention noted. There is no significant abdominal tenderness.  Musculoskeletal: Normal range of motion.  Neurological: She is alert and oriented to person, place, and time.  Skin: Skin is warm and dry. She is not diaphoretic.  Nursing note and vitals reviewed.    ED Treatments / Results  Labs (all labs ordered are listed, but only abnormal results are displayed) Labs Reviewed  BASIC METABOLIC PANEL - Abnormal; Notable for the following:       Result Value   Sodium 134 (*)    CO2 21 (*)    Glucose, Bld 139 (*)    All other components within normal limits  CBC - Abnormal; Notable for the following:    Hemoglobin 9.5 (*)    HCT 31.1 (*)    MCH 24.5 (*)    RDW 15.9 (*)    All other  components within normal limits  LIPASE, BLOOD  I-STAT  TROPOININ, ED    EKG  EKG Interpretation  Date/Time:  Sunday March 06 2016 12:52:57 EDT Ventricular Rate:  78 PR Interval:  144 QRS Duration: 90 QT Interval:  400 QTC Calculation: 456 R Axis:   26 Text Interpretation:  Sinus rhythm with occasional Premature ventricular complexes Nonspecific ST and T wave abnormality Abnormal ECG Confirmed by Ruhaan Nordahl  MD, Rennie Hack ([id number]) on 03/06/2016 1:05:00 PM       Radiology No results found.  Procedures Procedures (including critical care time)  Medications Ordered in ED Medications - No data to display   Initial Impression / Assessment and Plan / ED Course  I have reviewed the triage vital signs and the nursing notes.  Pertinent labs & imaging results that were available during my care of the patient were reviewed by me and considered in my medical decision making (see chart for details).  Clinical Course   Patient awaiting CT of the abdomen to evaluate etiology of distension and diarrhea.  Care signed out to Dr. Laneta Simmers at shift change.  Final Clinical Impressions(s) / ED Diagnoses   Final diagnoses:  None    New Prescriptions New Prescriptions   No medications on file     Veryl Speak, MD 03/10/16 1320

## 2016-03-08 ENCOUNTER — Ambulatory Visit (INDEPENDENT_AMBULATORY_CARE_PROVIDER_SITE_OTHER): Payer: Self-pay | Admitting: Thoracic Surgery (Cardiothoracic Vascular Surgery)

## 2016-03-08 ENCOUNTER — Telehealth: Payer: Self-pay

## 2016-03-08 ENCOUNTER — Encounter: Payer: Self-pay | Admitting: Thoracic Surgery (Cardiothoracic Vascular Surgery)

## 2016-03-08 ENCOUNTER — Ambulatory Visit (HOSPITAL_COMMUNITY)
Admission: RE | Admit: 2016-03-08 | Discharge: 2016-03-08 | Disposition: A | Discharge status: Home / Self Care | Payer: Medicare Other | Source: Ambulatory Visit | Attending: Thoracic Surgery (Cardiothoracic Vascular Surgery) | Admitting: Thoracic Surgery (Cardiothoracic Vascular Surgery)

## 2016-03-08 ENCOUNTER — Inpatient Hospital Stay: Admission: RE | Admit: 2016-03-08 | Payer: Medicare Other | Source: Ambulatory Visit

## 2016-03-08 VITALS — BP 121/70 | HR 79 | Resp 16 | Ht 62.0 in | Wt 162.0 lb

## 2016-03-08 DIAGNOSIS — J90 Pleural effusion, not elsewhere classified: Secondary | ICD-10-CM | POA: Diagnosis not present

## 2016-03-08 DIAGNOSIS — Z951 Presence of aortocoronary bypass graft: Secondary | ICD-10-CM

## 2016-03-08 DIAGNOSIS — I272 Pulmonary hypertension, unspecified: Secondary | ICD-10-CM | POA: Diagnosis not present

## 2016-03-08 DIAGNOSIS — I313 Pericardial effusion (noninflammatory): Secondary | ICD-10-CM | POA: Diagnosis not present

## 2016-03-08 DIAGNOSIS — I3139 Other pericardial effusion (noninflammatory): Secondary | ICD-10-CM

## 2016-03-08 NOTE — Telephone Encounter (Signed)
03/09/16 930 am appt pt has been notified and will keep appt as scheduled

## 2016-03-08 NOTE — Progress Notes (Signed)
  Echocardiogram 2D Echocardiogram has been performed.  [name] 03/08/2016, 2:03 PM

## 2016-03-08 NOTE — Telephone Encounter (Signed)
-----   Message from Darden Dates sent at 03/08/2016 11:23 AM EDT ----- Delorise Jackson, Never got to check back with you yesterday on Ms. Durene Fruits.  Did you get a chance to send Dr. Lenna Sciara a note? Thanks, Amy

## 2016-03-08 NOTE — Telephone Encounter (Signed)
Dr [name] this is [name]'s husbands aunt, she was seen in the ED and was told to follow up ASAP.  Can we double book?  See her recent CT scan, she was diagnosed with cirrhosis

## 2016-03-08 NOTE — Telephone Encounter (Signed)
Ok, double book for tomorrow AM.  Thanks

## 2016-03-08 NOTE — Progress Notes (Addendum)
[address]       [place] [address]             [phone]       HPI: [name] returns for 1 week follow-up  She is a 70 year old woman who underwent coronary bypass grafting 3 on 01/28/2016 after presenting with a non-Q-wave MI. She went home on postoperative day 5 without any significant complications.  I saw her in the office last week. She had noted over the week to 10 days prior to that visit that she had developed progressive abdominal distention and swelling in her legs. She was complaining of a poor appetite and some left upper quadrant discomfort. She was only taking Tylenol for incisional pain.  I ordered a complete metabolic panel, an abdominal CT, and an echocardiogram.  Over the past week she is felt about the same. She has a distended abdomen and edema in her legs is about the same despite increasing Lasix to 40 mg twice a day. She still feels general malaise and poor appetite.  Past Medical History:  Diagnosis Date  . Allergy   . Arthritis    neck  . Cataract    bilateral - MD monitoring cataracts  . Chronic kidney disease, stage I    DR OTTELIN  HX UTIS  . Cramp of limb   . Diabetes mellitus   . Dysphagia, unspecified(787.20)   . Dysuria   . Epistaxis   . GERD (gastroesophageal reflux disease)   . Heart murmur    NO CARDIOLOGIST  DX FOR YEARS ASYMPTOMATIC  . Lumbago   . Neoplasm of uncertain behavior of skin   . Nonspecific elevation of levels of transaminase or lactic acid dehydrogenase (LDH)   . Osteoarthrosis, unspecified whether generalized or localized, unspecified site   . Other and unspecified hyperlipidemia    diet controlled  . Pain in joint, shoulder region   . Paresthesias 04/01/2015  . Postablative ovarian failure   . Trochanteric bursitis of left hip 12/15/2015  . Type 2 diabetes mellitus without complication ([place])   . Unspecified essential hypertension    no meds      Current Outpatient Prescriptions    Medication Sig Dispense Refill  . acetaminophen (TYLENOL) 325 MG tablet Take 2 tablets (650 mg total) by mouth every 6 (six) hours as needed for mild pain.    [name] Kitchen AMBULATORY NON FORMULARY MEDICATION One Touch Ultra Blue Test Strips Sig: Use to test blood sugar three times daily to control blood sugar Dx: E11.9 100 each 11  . aspirin EC 325 MG EC tablet Take 1 tablet (325 mg total) by mouth daily. 30 tablet 0  . Biotin 10000 MCG TABS Take 10,000 mcg by mouth every morning.    . calcium carbonate (OS-CAL) 600 MG TABS Take 600 mg by mouth 2 (two) times daily with a meal.      . cholecalciferol (VITAMIN D) 1000 UNITS tablet Take 1,000 Units by mouth daily.      . ciprofloxacin (CIPRO) 250 MG tablet Take 250 mg by mouth 2 (two) times daily as needed (to prevent UTIs). Take one tablet after intercourse to prevent urinary tract infection    . Cyanocobalamin (VITAMIN B 12 PO) Take 1,000 mcg by mouth daily.      [name] Kitchen dextromethorphan-guaiFENesin (MUCINEX DM) 30-600 MG 12hr tablet Take 1 tablet by mouth 2 (two) times daily as needed for cough.    . fish oil-omega-3 fatty acids 1000 MG capsule  Take 1 g by mouth daily.      . fluticasone (FLONASE) 50 MCG/ACT nasal spray Place 2 sprays into both nostrils daily as needed for allergies or rhinitis.    . furosemide (LASIX) 40 MG tablet Take 1 tablet (40 mg total) by mouth 2 (two) times daily. (Patient taking differently: Take 40 mg by mouth daily. ) 92 tablet 3  . Glucosamine 500 MG CAPS Take 2,000 mg by mouth 2 (two) times daily.    [name] Kitchen glucose blood test strip Use as instructed to test blood sugar three times daily  Dx  250.00 100 each 12  . insulin detemir (LEVEMIR) 100 UNIT/ML injection Inject 45 Units into the skin at bedtime.     . Insulin Syringe-Needle U-100 (INSULIN SYRINGE .5CC/31GX5/16") 31G X 5/16" 0.5 ML MISC Use daily with the administration of insulin E11.9 100 each 11  . MAGNESIUM PO Take 500 mg by mouth 2 (two) times daily in the am and at bedtime..     . meloxicam (MOBIC) 15 MG tablet Take 15 mg by mouth daily.    . metFORMIN (GLUCOPHAGE) 1000 MG tablet Take one tablet by mouth twice daily to treat diabetes 180 tablet 3  . Metoprolol Tartrate 37.5 MG TABS Take 37.5 mg by mouth 2 (two) times daily. (Patient taking differently: Take 25 mg by mouth 2 (two) times daily. ) 60 tablet 3  . Multiple Vitamins-Minerals (MULTIVITAMIN WITH MINERALS) tablet Take 1 tablet by mouth daily.      [name] Kitchen omeprazole (PRILOSEC) 20 MG capsule TAKE ONE CAPSULE BY MOUTH DAILY 90 capsule 0  . potassium chloride SA (K-DUR,KLOR-CON) 20 MEQ tablet Take 20 mEq by mouth daily.    . Probiotic Product (PROBIOTIC DAILY PO) Take 1 capsule by mouth daily. Digestive Advantage Probiotic    . oxyCODONE (OXY IR/ROXICODONE) 5 MG immediate release tablet Take 1-2 tablets (5-10 mg total) by mouth every 4 (four) hours as needed for moderate pain or severe pain. (Patient not taking: Reported on 03/08/2016) 30 tablet 0  . Polyethyl Glycol-Propyl Glycol (SYSTANE OP) Place 1 drop into both eyes 2 (two) times daily.     No current facility-administered medications for this visit.     Physical Exam BP 121/70   Pulse 79   Resp 16   Ht 5' 2"  (1.575 m)   Wt 162 lb (73.5 kg)   SpO2 (!) 7% Comment: ON RA  BMI 29.107 kg/m  70 year old woman obviously uncomfortable but in no acute distress No jaundice Cardiac regular rate and rhythm normal S1 and S2 Lungs diminished breath sounds both bases Abdomen distended nontender 1+ Edema bilateral lower extremities  Diagnostic Tests: Echocardiogram  Reviewed but is not yet been read. Ejection fraction appears to be 50-55%. No pericardial effusion of significance.  CT ABDOMEN AND PELVIS WITH CONTRAST  TECHNIQUE: Multidetector CT imaging of the abdomen and pelvis was performed using the standard protocol following bolus administration of intravenous contrast.  CONTRAST:  151m ISOVUE-300 IOPAMIDOL (ISOVUE-300) INJECTION 61%  COMPARISON:   None.  FINDINGS: Lower chest: Lung bases are clear.  Hepatobiliary: Cirrhosis.  No suspicious/enhancing hepatic lesions.  Gallbladder is unremarkable. No intrahepatic or extrahepatic ductal dilatation.  Pancreas: Within normal limits.  Spleen: Within normal limits for size.  Adrenals/Urinary Tract: Adrenal glands are within normal limits.  6 mm left lower pole renal calculus (series 2/image 41). Right kidney is within normal limits.  No ureteral or bladder calculi.  No hydronephrosis.  Bladder is within normal limits.  Stomach/Bowel: Stomach is  within normal limits.  No evidence of bowel obstruction.  Normal appendix (series 2/ image 63).  Vascular/Lymphatic: No evidence of abdominal aortic aneurysm.  Atherosclerotic calcifications of the abdominal aorta and branch vessels.  Gastroesophageal and perigastric varices.  Portal vein is patent.  No suspicious abdominopelvic lymphadenopathy.  Reproductive: Status post hysterectomy.  No adnexal masses.  Other: Moderate to large abdominopelvic ascites.  Small fat/ fluid containing periumbilical hernia (series 2/image 66).  Musculoskeletal: Degenerative changes of the visualized thoracolumbar spine.  Status post PLIF at L3-4.  IMPRESSION: Cirrhosis.  No focal hepatic lesion is seen.  Moderate to large abdominopelvic ascites.  Gastroesophageal and perigastric varices.  Portal vein is patent.   Electronically Signed   By: Julian Hy M.D.   On: 03/06/2016 16:33  I personally reviewed the CT abdomen pelvis and agree with the findings of a small cirrhotic liver and moderate to large ascites  Impression: 70 year old woman who is now about 6 weeks out from coronary bypass grafting. She has developed severe ascites and some peripheral edema. CT of the abdomen and pelvis shows significant ascites and a small cirrhotic liver. Her AST is mildly elevated and ALT is normal. Her bilirubin  is normal as is her INR. Creatinine is normal as well. Albumin is mildly down at 2.8. I suspect that what we are seeing is chronic stable cirrhosis acutely decompensated due to her recent surgery. She needs to be seen by Dr. [name] as soon as we can arrange that.  From a cardiac standpoint that she stable. We'll check the official echocardiogram reading minutes done, but I do not see anything on it to account for her abdominal findings.  Plan: Appointment ASAP with Dr. [name] gastroenterology  Decrease lasix to 40 mg daily  Hold lipitor until seen by Dr. [name]  I will see her back in 3 weeks to check on her progress.  Kayla Nakayama, MD [facility] [phone]

## 2016-03-09 ENCOUNTER — Ambulatory Visit (INDEPENDENT_AMBULATORY_CARE_PROVIDER_SITE_OTHER): Payer: Medicare Other | Admitting: Gastroenterology

## 2016-03-09 ENCOUNTER — Other Ambulatory Visit (INDEPENDENT_AMBULATORY_CARE_PROVIDER_SITE_OTHER): Payer: Medicare Other

## 2016-03-09 ENCOUNTER — Encounter: Payer: Self-pay | Admitting: Gastroenterology

## 2016-03-09 VITALS — BP 118/70 | HR 72 | Ht 63.0 in | Wt 168.2 lb

## 2016-03-09 DIAGNOSIS — K7460 Unspecified cirrhosis of liver: Secondary | ICD-10-CM

## 2016-03-09 DIAGNOSIS — R188 Other ascites: Secondary | ICD-10-CM

## 2016-03-09 HISTORY — DX: Unspecified cirrhosis of liver: K74.60

## 2016-03-09 LAB — IBC PANEL
IRON: 26 ug/dL — AB (ref 42–145)
SATURATION RATIOS: 6.1 % — AB (ref 20.0–50.0)
TRANSFERRIN: 304 mg/dL (ref 212.0–360.0)

## 2016-03-09 LAB — BASIC METABOLIC PANEL
BUN: 14 mg/dL (ref 6–23)
CALCIUM: 9.3 mg/dL (ref 8.4–10.5)
CO2: 23 mEq/L (ref 19–32)
CREATININE: 0.76 mg/dL (ref 0.40–1.20)
Chloride: 104 mEq/L (ref 96–112)
GFR: 80.14 mL/min (ref >60.00)
Glucose, Bld: 246 mg/dL — ABNORMAL HIGH (ref 70–99)
POTASSIUM: 3.6 meq/L (ref 3.5–5.1)
Sodium: 139 mEq/L (ref 135–145)

## 2016-03-09 LAB — FERRITIN: Ferritin: 17.5 ng/mL (ref 10.0–291.0)

## 2016-03-09 MED ORDER — SPIRONOLACTONE 100 MG PO TABS: 100.0000 mg | ORAL_TABLET | Freq: Two times a day (BID) | ORAL | 6 refills | Status: DC

## 2016-03-09 NOTE — Progress Notes (Signed)
Review of pertinent gastrointestinal problems: 1. Personal history of adenomatous colon polyps. Colonoscopy Dr. Ardis Conway 2012 August was normal except for 2 subcentimeter polyps, one was a tubular adenoma. She was recommended to have recall colonoscopy at five-year interval. She had also had previous colonoscopies with Dr. Lajoyce Conway and found to have adenomas as well.   HPI: This is a    very pleasant 70 year old woman   who was referred to me by Dr. Merilynn Conway for new diagnosis of cirrhosis, ascites  Chief complaint is cirrhosis, ascites, lower extremity edema   She has never been told she had significant liver disease besides fatty liver on imaging 6-7 years ago. Leading up to her heart attack and bypass surgery she was getting some slight swelling in her abdomen. Nothing too serious. The swelling increased rapidly about a week after her bypass surgery. Also for the past 2 weeks she has had diarrhea. Nonbloody. 4-5 times per day including nocturnal.  She was seeing her cardiac surgeon yesterday and he called for urgent appointment for cirrhosis, decompensated liver disease.  No overt GI bleeding. She has never been told she had hepatitis.  Brother had etoh related cirrhosis  She is not a etoh drinker.  CT scan abd/pelvis 10/17 with IV and PO contrast: Cirrhosis.  No focal hepatic lesion is seen. Moderate to large abdominopelvic ascites. Gastroesophageal and perigastric varices.  Portal vein is patent.  Bloodwork recently shows normal platelets, slightly elevated transaminases with AST rate of an ALT, normal bilirubin, normal coags, sodium 135, normal Cr.    underwent coronary bypass grafting 3 on 01/28/2016 after presenting with a non-Q-wave MI. She went home on postoperative day 5 without any significant complications  Review of systems: Pertinent positive and negative review of systems were noted in the above HPI section. Complete review of systems was performed and was otherwise  normal.   Past Medical History:  Diagnosis Date  . Allergy   . Arthritis    neck  . Cataract    bilateral - MD monitoring cataracts  . Chronic kidney disease, stage I    DR OTTELIN  HX UTIS  . Cirrhosis ([place])   . Cramp of limb   . Diabetes mellitus   . Dysphagia, unspecified(787.20)   . Dysuria   . Epistaxis   . GERD (gastroesophageal reflux disease)   . Heart murmur    NO CARDIOLOGIST  DX FOR YEARS ASYMPTOMATIC  . Lumbago   . Neoplasm of uncertain behavior of skin   . Nonspecific elevation of levels of transaminase or lactic acid dehydrogenase (LDH)   . Osteoarthrosis, unspecified whether generalized or localized, unspecified site   . Other and unspecified hyperlipidemia    diet controlled  . Pain in joint, shoulder region   . Paresthesias 04/01/2015  . Postablative ovarian failure   . Trochanteric bursitis of left hip 12/15/2015  . Type 2 diabetes mellitus without complication ([place])   . Unspecified essential hypertension    no meds    Past Surgical History:  Procedure Laterality Date  . CARDIAC CATHETERIZATION N/A 01/27/2016   Procedure: Left Heart Cath and Coronary Angiography;  Surgeon: Belva Crome, MD;  Location: [facility];  Service: Cardiovascular;  Laterality: N/A;  . COLONOSCOPY  2012   [name] - polyp  . CORONARY ARTERY BYPASS GRAFT N/A 01/28/2016   Procedure: CORONARY ARTERY BYPASS GRAFTING (CABG) x 3 USING RIGHT LEG GREATER SAPHENOUS VEIN GRAFT;  Surgeon: Melrose Nakayama, MD;  Location: [facility];  Service: Open  Heart Surgery;  Laterality: N/A;  . ENDOVEIN HARVEST OF GREATER SAPHENOUS VEIN Right 01/28/2016   Procedure: ENDOVEIN HARVEST OF GREATER SAPHENOUS VEIN;  Surgeon: Melrose Nakayama, MD;  Location: [facility];  Service: Open Heart Surgery;  Laterality: Right;  . MAXIMUM ACCESS (MAS)POSTERIOR LUMBAR INTERBODY FUSION (PLIF) 1 LEVEL Left 06/10/2015   Procedure: FOR MAXIMUM ACCESS (MAS) POSTERIOR LUMBAR INTERBODY FUSION (PLIF) LUMBAR THREE-FOUR  EXTRAFORAMINAL MICRODISCECTOMY LUMBAR FIVE-SACRAL ONE LEFT;  Surgeon: Eustace Moore, MD;  Location: [facility];  Service: Neurosurgery;  Laterality: Left;  . TEE WITHOUT CARDIOVERSION N/A 01/28/2016   Procedure: TRANSESOPHAGEAL ECHOCARDIOGRAM (TEE);  Surgeon: Melrose Nakayama, MD;  Location: [facility];  Service: Open Heart Surgery;  Laterality: N/A;  . TUBAL LIGATION  1982   Dr Connye Burkitt  . UPPER GASTROINTESTINAL ENDOSCOPY    . VAGINAL HYSTERECTOMY  1997   Dr Rande Lawman    Current Outpatient Prescriptions  Medication Sig Dispense Refill  . acetaminophen (TYLENOL) 325 MG tablet Take 2 tablets (650 mg total) by mouth every 6 (six) hours as needed for mild pain.    [name] Kitchen AMBULATORY NON FORMULARY MEDICATION One Touch Ultra Blue Test Strips Sig: Use to test blood sugar three times daily to control blood sugar Dx: E11.9 100 each 11  . aspirin EC 325 MG EC tablet Take 1 tablet (325 mg total) by mouth daily. 30 tablet 0  . Biotin 10000 MCG TABS Take 10,000 mcg by mouth every morning.    . calcium carbonate (OS-CAL) 600 MG TABS Take 600 mg by mouth 2 (two) times daily with a meal.      . cholecalciferol (VITAMIN D) 1000 UNITS tablet Take 1,000 Units by mouth daily.      . ciprofloxacin (CIPRO) 250 MG tablet Take 250 mg by mouth 2 (two) times daily as needed (to prevent UTIs). Take one tablet after intercourse to prevent urinary tract infection    . Cyanocobalamin (VITAMIN B 12 PO) Take 1,000 mcg by mouth daily.      [name] Kitchen dextromethorphan-guaiFENesin (MUCINEX DM) 30-600 MG 12hr tablet Take 1 tablet by mouth 2 (two) times daily as needed for cough.    . fish oil-omega-3 fatty acids 1000 MG capsule Take 1 g by mouth daily.      . fluticasone (FLONASE) 50 MCG/ACT nasal spray Place 2 sprays into both nostrils daily as needed for allergies or rhinitis.    . furosemide (LASIX) 40 MG tablet Take 1 tablet (40 mg total) by mouth 2 (two) times daily. (Patient taking differently: Take 40 mg by mouth daily. ) 92 tablet 3  .  Glucosamine 500 MG CAPS Take 2,000 mg by mouth 2 (two) times daily.    [name] Kitchen glucose blood test strip Use as instructed to test blood sugar three times daily  Dx  250.00 100 each 12  . insulin detemir (LEVEMIR) 100 UNIT/ML injection Inject 45 Units into the skin at bedtime.     . Insulin Syringe-Needle U-100 (INSULIN SYRINGE .5CC/31GX5/16") 31G X 5/16" 0.5 ML MISC Use daily with the administration of insulin E11.9 100 each 11  . MAGNESIUM PO Take 500 mg by mouth 2 (two) times daily in the am and at bedtime..    . meloxicam (MOBIC) 15 MG tablet Take 15 mg by mouth daily.    . metFORMIN (GLUCOPHAGE) 1000 MG tablet Take one tablet by mouth twice daily to treat diabetes 180 tablet 3  . Metoprolol Tartrate 37.5 MG TABS Take 37.5 mg by mouth 2 (two) times daily. (  Patient taking differently: Take 25 mg by mouth 2 (two) times daily. ) 60 tablet 3  . Multiple Vitamins-Minerals (MULTIVITAMIN WITH MINERALS) tablet Take 1 tablet by mouth daily.      [name] Kitchen omeprazole (PRILOSEC) 20 MG capsule TAKE ONE CAPSULE BY MOUTH DAILY 90 capsule 0  . oxyCODONE (OXY IR/ROXICODONE) 5 MG immediate release tablet Take 1-2 tablets (5-10 mg total) by mouth every 4 (four) hours as needed for moderate pain or severe pain. 30 tablet 0  . Polyethyl Glycol-Propyl Glycol (SYSTANE OP) Place 1 drop into both eyes 2 (two) times daily.    . potassium chloride SA (K-DUR,KLOR-CON) 20 MEQ tablet Take 20 mEq by mouth daily.    . Probiotic Product (PROBIOTIC DAILY PO) Take 1 capsule by mouth daily. Digestive Advantage Probiotic     No current facility-administered medications for this visit.     Allergies as of 03/09/2016 - Review Complete 03/09/2016  Allergen Reaction Noted  . Kiwi extract Anaphylaxis 05/28/2015  . Tdap [diphth-acell pertussis-tetanus] Swelling 03/27/2013  . Tramadol Nausea And Vomiting 05/28/2015  . Latex Itching, Dermatitis, and Rash 12/22/2010    Family History  Problem Relation Age of Onset  . Lung cancer Father   .  Arthritis Sister   . Arthritis Brother   . Heart disease Maternal Grandmother   . Heart disease Maternal Grandfather   . Heart disease Paternal Grandmother   . Heart disease Paternal Grandfather   . Liver cancer Brother   . Colon cancer Neg Hx   . Esophageal cancer Neg Hx   . Rectal cancer Neg Hx   . Stomach cancer Neg Hx     Social History   Social History  . Marital status: Married    Spouse name: N/A  . Number of children: 6  . Years of education: N/A   Occupational History  . retired    Social History Main Topics  . Smoking status: Never Smoker  . Smokeless tobacco: Never Used  . Alcohol use No  . Drug use: No  . Sexual activity: Yes    Partners: Male    Birth control/ protection: Post-menopausal, Surgical     Comment: Hysterectomy   Other Topics Concern  . Not on file   Social History Narrative  . No narrative on file     Physical Exam: Ht 5' 3"  (1.6 m) Comment: height measured without shoes  Wt 168 lb 4 oz (76.3 kg)   BMI 29.80 kg/m  Constitutional: generally well-appearing Psychiatric: alert and oriented x3 Eyes: extraocular movements intact Mouth: oral pharynx moist, no lesions Neck: supple no lymphadenopathy Cardiovascular: heart regular rate and rhythm Lungs: clear to auscultation bilaterally Abdomen: soft, nontender, nondistended, obvious tense ascites without significant abdominal pains Extremities: 1+ pitting edema bilaterally in her ankles Skin: no lesions on visible extremities   Assessment and plan: 70 y.o. female with  newly diagnosed cirrhosis, decompensated liver disease  First she clearly has cirrhosis on imaging. Etiology is unclear however she did have fatty liver disease on imaging many years ago so perhaps this is just an extension of that. We will test her for other potential etiologies of chronic liver disease including viral, autoimmune etc. See those lab tests below. Her biggest issue right now is that of edema, ascites; she is  going to resume Lasix 40 mg twice daily and she will also start spironolactone 100 mg twice daily. We are going to send her for a diagnostic, therapeutic paracentesis through interventional radiology. I'm asking that the take  a maximum of 4 L. She give her some significant symptomatic improvement and should not put her at risk for renal issues. We will send the fluid for cell count, differential, cytology, albumin and protein level. She is having a lot of diarrhea for the past 2 weeks. This might be related to: Edema from her cirrhosis. Perhaps infectious with C. difficile following antibiotics during her hospital stay 6 weeks ago. She will have some stool testing today for Clostridium difficile. I also instructed her to start taking a single Imodium at bedtime every night for now. She can avoid salt, she does not take NSAIDs she will continue not taking NSAIDs. She knows to start weighing herself daily and the goal will be about 1 pound per day with her diuretic regimen. She will have repeat labs in 2 weeks. She will return to see me or extend her here in our office in 4 weeks.  She will eventually need upper endoscopy to evaluate the likely esophageal varices, probably same time colonoscopy for polyp surveillance as well. I don't think now is time for either of these tests.    Kayla Loffler, MD [facility] 03/09/2016, 9:34 AM  Cc: Estill Dooms, MD

## 2016-03-09 NOTE — Patient Instructions (Addendum)
Large volume paracentesis through radiology (4 liters max):   Fluid needs to be sent for cell count, differential, albumin, total protein, cytology. Please arrive at [facility] entrance A at 8:45 am on 03/10/16 for paracentesis.    You will get labs drawn today:  Hepatitis A (IgM and IgG), Hepatitis B surface antigen, Hepatitis B surface antibody, Hepatitis C antibody, total iron, ferritin, TIBC, ANA, AMA, alphafeto protein (AFP), anti smooth muscle antibody.  Stool for c. Difficile by PCR and by toxin, routine stool culture.  Take one imodium at bedtime every night for now.  Your diuretics will be as follows: Lasix 40 mg twice daily. Aldactone 100 mg twice daily. Aldactone has been sent to your pharmacy.  Please take your weight daily, the goal is for you to lose about 1 pound per day. If you start to lose much more than that please call, if you are still putting on weight despite your new diuretic regimen also you should please call.  It is important that you have a relatively low salt diet.  High salt diet can cause fluid to accumulate in your legs, abdomen and even around your lungs.  You should try to avoid NSAID type over the counter pain medicines as best as possible.  Tylenol is safe to take for 'routine' aches and pains, but never take more than 1/2 the dose suggested on the package instructions (never more than 2 grams per day).  Avoid alcohol.  Please return to see Ellouise Newer, PA on 04/08/16 at 9:45 am  Repeat labs in 2 weeks (bmet) around 03/23/16, you do not need an appointment.  The [facility] lab is open 730 am-5 pm.

## 2016-03-10 ENCOUNTER — Ambulatory Visit (HOSPITAL_COMMUNITY)
Admission: RE | Admit: 2016-03-10 | Discharge: 2016-03-10 | Disposition: A | Discharge status: Home / Self Care | Payer: Medicare Other | Source: Ambulatory Visit | Attending: Gastroenterology | Admitting: Gastroenterology

## 2016-03-10 ENCOUNTER — Other Ambulatory Visit: Payer: Medicare Other

## 2016-03-10 ENCOUNTER — Other Ambulatory Visit: Payer: Self-pay | Admitting: Gastroenterology

## 2016-03-10 DIAGNOSIS — K7460 Unspecified cirrhosis of liver: Secondary | ICD-10-CM | POA: Insufficient documentation

## 2016-03-10 DIAGNOSIS — R188 Other ascites: Secondary | ICD-10-CM

## 2016-03-10 LAB — HEPATITIS B SURFACE ANTIGEN: Hepatitis B Surface Ag: NEGATIVE

## 2016-03-10 LAB — ANA: Anti Nuclear Antibody(ANA): NEGATIVE

## 2016-03-10 LAB — BODY FLUID CELL COUNT WITH DIFFERENTIAL
LYMPHS FL: 36 %
Monocyte-Macrophage-Serous Fluid: 58 % (ref 50–90)
NEUTROPHIL FLUID: 6 % (ref 0–25)
Total Nucleated Cell Count, Fluid: 359 cu mm (ref 0–1000)

## 2016-03-10 LAB — HEPATITIS B SURFACE ANTIBODY,QUALITATIVE: HEP B S AB: NEGATIVE

## 2016-03-10 LAB — PROTEIN, BODY FLUID

## 2016-03-10 LAB — ALBUMIN, FLUID (OTHER)

## 2016-03-10 LAB — HEPATITIS C ANTIBODY: HCV AB: NEGATIVE

## 2016-03-10 LAB — MITOCHONDRIAL ANTIBODIES

## 2016-03-10 LAB — C. DIFFICILE GDH AND TOXIN A/B
C. DIFFICILE GDH: NOT DETECTED
C. difficile Toxin A/B: NOT DETECTED

## 2016-03-10 LAB — HEPATITIS A ANTIBODY, TOTAL: Hep A Total Ab: NONREACTIVE

## 2016-03-10 LAB — AFP TUMOR MARKER: AFP TUMOR MARKER: 1.4 ng/mL (ref <6.1)

## 2016-03-10 MED ORDER — LIDOCAINE HCL 1 % IJ SOLN
INTRAMUSCULAR | Status: AC
  Filled 2016-03-10: qty 20

## 2016-03-10 NOTE — Procedures (Signed)
Successful US guided paracentesis from [facility].  Yielded 4L of clear yellow fluid.  No immediate complications.  Pt tolerated well.   Specimen was sent for labs.  Ascencion Dike PA-C 03/10/2016 10:00 AM

## 2016-03-11 LAB — CLOSTRIDIUM DIFFICILE BY PCR: Toxigenic C. Difficile by PCR: NOT DETECTED

## 2016-03-11 LAB — ANTI-SMOOTH MUSCLE ANTIBODY, IGG: SMOOTH MUSCLE AB: 22 U — AB (ref <20)

## 2016-03-14 ENCOUNTER — Other Ambulatory Visit: Payer: Self-pay

## 2016-03-14 ENCOUNTER — Other Ambulatory Visit (INDEPENDENT_AMBULATORY_CARE_PROVIDER_SITE_OTHER): Payer: Medicare Other

## 2016-03-14 ENCOUNTER — Telehealth: Payer: Self-pay | Admitting: Gastroenterology

## 2016-03-14 DIAGNOSIS — K7460 Unspecified cirrhosis of liver: Secondary | ICD-10-CM | POA: Diagnosis not present

## 2016-03-14 DIAGNOSIS — R188 Other ascites: Secondary | ICD-10-CM

## 2016-03-14 LAB — STOOL CULTURE

## 2016-03-14 LAB — BASIC METABOLIC PANEL
BUN: 17 mg/dL (ref 6–23)
CALCIUM: 9.6 mg/dL (ref 8.4–10.5)
CHLORIDE: 101 meq/L (ref 96–112)
CO2: 28 meq/L (ref 19–32)
CREATININE: 0.77 mg/dL (ref 0.40–1.20)
GFR: 78.94 mL/min (ref >60.00)
GLUCOSE: 240 mg/dL — AB (ref 70–99)
Potassium: 4.2 mEq/L (ref 3.5–5.1)
Sodium: 137 mEq/L (ref 135–145)

## 2016-03-14 NOTE — Telephone Encounter (Signed)
Ok, lets get BMEt today so I can adjust her diuretics upward, safely.  Thanks

## 2016-03-14 NOTE — Telephone Encounter (Signed)
Pt is taking Lasix 40 mg twice daily. Aldactone 100 mg twice daily. She states she lost 7 pounds after paracentesis and has remained at 159 since 03/10/16.  Feet and legs are swollen. No SOB.    Please advise

## 2016-03-14 NOTE — Telephone Encounter (Signed)
Lab in [facility] and pt will come in today to have labs drawn.

## 2016-03-18 ENCOUNTER — Other Ambulatory Visit: Payer: Self-pay | Admitting: Internal Medicine

## 2016-03-23 ENCOUNTER — Other Ambulatory Visit: Payer: Medicare Other

## 2016-03-23 ENCOUNTER — Telehealth: Payer: Self-pay | Admitting: Gastroenterology

## 2016-03-23 NOTE — Telephone Encounter (Signed)
FYI Dr [name] pt calling with a condition update

## 2016-03-28 ENCOUNTER — Other Ambulatory Visit: Payer: Self-pay | Admitting: Thoracic Surgery (Cardiothoracic Vascular Surgery)

## 2016-03-28 DIAGNOSIS — Z951 Presence of aortocoronary bypass graft: Secondary | ICD-10-CM

## 2016-03-29 ENCOUNTER — Ambulatory Visit (INDEPENDENT_AMBULATORY_CARE_PROVIDER_SITE_OTHER): Payer: Self-pay | Admitting: Thoracic Surgery (Cardiothoracic Vascular Surgery)

## 2016-03-29 ENCOUNTER — Ambulatory Visit
Admission: RE | Admit: 2016-03-29 | Discharge: 2016-03-29 | Disposition: A | Discharge status: Home / Self Care | Payer: Medicare Other | Source: Ambulatory Visit | Attending: Thoracic Surgery (Cardiothoracic Vascular Surgery) | Admitting: Thoracic Surgery (Cardiothoracic Vascular Surgery)

## 2016-03-29 VITALS — BP 113/63 | HR 74 | Resp 20 | Ht 63.0 in | Wt 143.0 lb

## 2016-03-29 DIAGNOSIS — Z951 Presence of aortocoronary bypass graft: Secondary | ICD-10-CM

## 2016-03-29 DIAGNOSIS — R079 Chest pain, unspecified: Secondary | ICD-10-CM | POA: Diagnosis not present

## 2016-03-29 NOTE — Progress Notes (Signed)
[address]       [place] [address]             [phone]    HPI: [name] returns for a scheduled follow-up visit.  She is a 70 year old woman who had coronary bypass 3 on 01/28/2016. She had presented with a non-Q-wave MI. Her early postoperative course was uncomplicated and she went home on day 5.  She presented back about a month postop with progressive abdominal distention and swelling in her legs. We started her on some diuretics. An echocardiogram showed normal LV function with no significant effusion. There is possibly very mildly elevated pulmonary artery systolic pressure. A CT of the abdomen and pelvis showed evidence of cirrhosis and ascites. She saw Dr. [name] and had a paracentesis draining about 4 L of fluid. He adjusted her diuretic regimen to include spironolactone and Lasix.  She feels much better. She's not having much incisional pain. Her abdominal distention is markedly improved since she had paracentesis. Her leg swelling has resolved. She is anxious to resume normal activities. She does complain of numbness in the lateral aspect of both thighs.  Past Medical History:  Diagnosis Date  . Allergy   . Arthritis    neck  . Cataract    bilateral - MD monitoring cataracts  . Chronic kidney disease, stage I    DR OTTELIN  HX UTIS  . Cirrhosis ([place])   . Cramp of limb   . Diabetes mellitus   . Dysphagia, unspecified(787.20)   . Dysuria   . Epistaxis   . GERD (gastroesophageal reflux disease)   . Heart murmur    NO CARDIOLOGIST  DX FOR YEARS ASYMPTOMATIC  . Lumbago   . Neoplasm of uncertain behavior of skin   . Nonspecific elevation of levels of transaminase or lactic acid dehydrogenase (LDH)   . Osteoarthrosis, unspecified whether generalized or localized, unspecified site   . Other and unspecified hyperlipidemia    diet controlled  . Pain in joint, shoulder region   . Paresthesias 04/01/2015  . Postablative ovarian failure   .  Trochanteric bursitis of left hip 12/15/2015  . Type 2 diabetes mellitus without complication ([place])   . Unspecified essential hypertension    no meds     Current Outpatient Prescriptions  Medication Sig Dispense Refill  . acetaminophen (TYLENOL) 325 MG tablet Take 2 tablets (650 mg total) by mouth every 6 (six) hours as needed for mild pain.    [name] Kitchen AMBULATORY NON FORMULARY MEDICATION One Touch Ultra Blue Test Strips Sig: Use to test blood sugar three times daily to control blood sugar Dx: E11.9 100 each 11  . aspirin EC 325 MG EC tablet Take 1 tablet (325 mg total) by mouth daily. 30 tablet 0  . Biotin 10000 MCG TABS Take 10,000 mcg by mouth every morning.    . calcium carbonate (OS-CAL) 600 MG TABS Take 600 mg by mouth 2 (two) times daily with a meal.      . cholecalciferol (VITAMIN D) 1000 UNITS tablet Take 1,000 Units by mouth daily.      . ciprofloxacin (CIPRO) 250 MG tablet Take 250 mg by mouth 2 (two) times daily as needed (to prevent UTIs). Take one tablet after intercourse to prevent urinary tract infection    . Cyanocobalamin (VITAMIN B 12 PO) Take 1,000 mcg by mouth daily.      [name] Kitchen dextromethorphan-guaiFENesin (MUCINEX DM) 30-600 MG 12hr tablet Take 1 tablet by mouth 2 (  two) times daily as needed for cough.    . fish oil-omega-3 fatty acids 1000 MG capsule Take 1 g by mouth daily.      . fluticasone (FLONASE) 50 MCG/ACT nasal spray Place 2 sprays into both nostrils daily as needed for allergies or rhinitis.    . furosemide (LASIX) 40 MG tablet Take 1 tablet (40 mg total) by mouth 2 (two) times daily. (Patient taking differently: Take 40 mg by mouth daily. ) 92 tablet 3  . Glucosamine 500 MG CAPS Take 2,000 mg by mouth 2 (two) times daily.    [name] Kitchen glucose blood test strip Use as instructed to test blood sugar three times daily  Dx  250.00 100 each 12  . insulin detemir (LEVEMIR) 100 UNIT/ML injection Inject 45 Units into the skin at bedtime.     . Insulin Syringe-Needle U-100 (INSULIN  SYRINGE .5CC/31GX5/16") 31G X 5/16" 0.5 ML MISC Use daily with the administration of insulin E11.9 100 each 11  . MAGNESIUM PO Take 500 mg by mouth 2 (two) times daily in the am and at bedtime..    . meloxicam (MOBIC) 15 MG tablet Take 15 mg by mouth daily.    . metFORMIN (GLUCOPHAGE) 1000 MG tablet Take one tablet by mouth twice daily to treat diabetes 180 tablet 3  . Metoprolol Tartrate 37.5 MG TABS Take 37.5 mg by mouth 2 (two) times daily. (Patient taking differently: Take 25 mg by mouth 2 (two) times daily. ) 60 tablet 3  . Multiple Vitamins-Minerals (MULTIVITAMIN WITH MINERALS) tablet Take 1 tablet by mouth daily.      [name] Kitchen omeprazole (PRILOSEC) 20 MG capsule TAKE ONE CAPSULE BY MOUTH DAILY 90 capsule 1  . oxyCODONE (OXY IR/ROXICODONE) 5 MG immediate release tablet Take 1-2 tablets (5-10 mg total) by mouth every 4 (four) hours as needed for moderate pain or severe pain. 30 tablet 0  . Polyethyl Glycol-Propyl Glycol (SYSTANE OP) Place 1 drop into both eyes 2 (two) times daily.    . potassium chloride SA (K-DUR,KLOR-CON) 20 MEQ tablet Take 20 mEq by mouth daily.    . Probiotic Product (PROBIOTIC DAILY PO) Take 1 capsule by mouth daily. Digestive Advantage Probiotic    . spironolactone (ALDACTONE) 100 MG tablet Take 1 tablet (100 mg total) by mouth 2 (two) times daily. 60 tablet 6   No current facility-administered medications for this visit.     Physical Exam BP 113/63 (BP Location: Right Arm, Patient Position: Sitting, Cuff Size: Normal)   Pulse 74   Resp 20   Ht 5' 3"  (1.6 m)   Wt 143 lb (64.9 kg)   SpO2 99% Comment: RA  BMI 25.48 kg/m  70 year old woman in no acute distress Alert and oriented 3 with no focal deficits Sternum stable, incision well-healed Lungs clear with breath sounds bilaterally Abdomen ascites resolved No peripheral edema and  Diagnostic Tests: CHEST  2 VIEW  COMPARISON:  03/06/2016  FINDINGS: Normal heart size. Median sternotomy and CABG procedure  noted. The sternal sutures appear intact. No pleural effusion or edema identified. No airspace opacities.  IMPRESSION: 1. Status post CABG procedure.  No complications identified.   Electronically Signed   By: Kerby Moors M.D.   On: 03/29/2016 11:36 I personally reviewed chest x-ray and concur with findings noted above.  Impression: [name] is a 70 year old woman who had coronary bypass grafting 3 after presenting with a non-Q-wave MI. She is 2 months out from surgery.  She developed ascites about a month postoperatively.  She also had severe peripheral edema. Workup has shown cirrhosis. Dr. [name] is treating that.  From a surgical standpoint she's doing well. There are no restrictions on her activities. She should build into new activities gradually. She is okay to drive.  She has not followed up with cardiology since discharge. We will arrange for her to see Dr. Johnsie Cancel or one of his PAs.  Plan: Follow-up with cardiology.  Follow-up with Dr. [name].  I will be happy to see [name] back at any time if I can be of any further assistance with her care  Melrose Nakayama, MD [facility] [phone]

## 2016-04-01 ENCOUNTER — Other Ambulatory Visit: Payer: Self-pay | Admitting: Internal Medicine

## 2016-04-04 ENCOUNTER — Ambulatory Visit: Payer: Medicare Other | Admitting: Nurse Practitioner

## 2016-04-05 ENCOUNTER — Ambulatory Visit (INDEPENDENT_AMBULATORY_CARE_PROVIDER_SITE_OTHER): Payer: Medicare Other | Admitting: Physician Assistant

## 2016-04-05 ENCOUNTER — Telehealth: Payer: Self-pay | Admitting: Physician Assistant

## 2016-04-05 ENCOUNTER — Telehealth: Payer: Self-pay | Admitting: *Deleted

## 2016-04-05 ENCOUNTER — Encounter: Payer: Self-pay | Admitting: Physician Assistant

## 2016-04-05 VITALS — BP 128/60 | HR 84 | Ht 64.0 in | Wt 138.8 lb

## 2016-04-05 DIAGNOSIS — I10 Essential (primary) hypertension: Secondary | ICD-10-CM

## 2016-04-05 DIAGNOSIS — I493 Ventricular premature depolarization: Secondary | ICD-10-CM | POA: Diagnosis not present

## 2016-04-05 DIAGNOSIS — E7800 Pure hypercholesterolemia, unspecified: Secondary | ICD-10-CM

## 2016-04-05 DIAGNOSIS — I2510 Atherosclerotic heart disease of native coronary artery without angina pectoris: Secondary | ICD-10-CM

## 2016-04-05 DIAGNOSIS — R202 Paresthesia of skin: Secondary | ICD-10-CM

## 2016-04-05 DIAGNOSIS — K7460 Unspecified cirrhosis of liver: Secondary | ICD-10-CM | POA: Insufficient documentation

## 2016-04-05 DIAGNOSIS — I249 Acute ischemic heart disease, unspecified: Secondary | ICD-10-CM

## 2016-04-05 DIAGNOSIS — R188 Other ascites: Secondary | ICD-10-CM

## 2016-04-05 LAB — BASIC METABOLIC PANEL
BUN: 16 mg/dL (ref 7–25)
CALCIUM: 9.8 mg/dL (ref 8.6–10.4)
CHLORIDE: 98 mmol/L (ref 98–110)
CO2: 25 mmol/L (ref 20–31)
Creat: 0.8 mg/dL (ref 0.50–0.99)
Glucose, Bld: 317 mg/dL — ABNORMAL HIGH (ref 65–99)
POTASSIUM: 4.1 mmol/L (ref 3.5–5.3)
SODIUM: 134 mmol/L — AB (ref 135–146)

## 2016-04-05 LAB — MAGNESIUM: Magnesium: 1.8 mg/dL (ref 1.5–2.5)

## 2016-04-05 MED ORDER — ATORVASTATIN CALCIUM 80 MG PO TABS: 80.0000 mg | ORAL_TABLET | Freq: Every day | ORAL | 3 refills | Status: DC

## 2016-04-05 NOTE — Telephone Encounter (Signed)
Pt notified of lab results and findings of high glucose of 317. Pt has been advised to f/u w/PCP or Endocrinologist. Pt is agreeable to plan of care.

## 2016-04-05 NOTE — Addendum Note (Signed)
Addended by: Michae Kava on: 04/05/2016 05:34 PM   Modules accepted: Orders

## 2016-04-05 NOTE — Telephone Encounter (Signed)
Please tell [name] that I did review with Dr. [name]. She may resume her Lipitor 80 mg Once daily  Arrange LFTs 1 month and Lipids and LFTs 3 mos after resuming. Richardson Dopp, PA-C   04/05/2016 4:40 PM

## 2016-04-05 NOTE — Progress Notes (Signed)
Cardiology Office Note:    Date:  04/05/2016   ID:  [name], DOB 1945/05/19, MRN [id number]  PCP:  Jeanmarie Hubert, MD  Cardiologist:  Dr. [name]   Electrophysiologist:  n/a GI:  Dr. [name]  Referring MD: Estill Dooms, MD   Chief Complaint  Patient presents with  . Follow-up    CAD    History of Present Illness:    [name] is a 70 y.o. female with a hx of CAD s/p CABG, PVCs, HTN, HL, DM2, GERD.  She initially presented with a NSTEMI in 9/17.  She had 3 V CAD on LHC with EF 45-50.  She underwent CABG with L-LAD, S-RI, S-RCA.  She was last seen in clinic by Angelena Form, PA-C in 10/17.  She was placed back on Lasix for LE edema.  She then saw Dr. Roxan Hockey for follow up and was noted to have abdominal distention/ascites.  Her Lasix was increased to 40 bid.  A follow up echocardiogram demonstrated EF 32-67, mild diastolic dysfunction, L pleural effusion, PASP 38.  There was no pericardial effusion.  Abdominal CT demonstrated findings consistent with cirrhosis, gastroesophageal perigastric varices and moderate to large abdominopelvic ascites. She has also had problems with diarrhea.  Of note, CDiff was neg.  She was referred to gastroenterology. She saw Dr. [name] 03/09/16. She was placed back on Lasix 40 twice a day and started on spironolactone 100 twice a day.  She underwent paracentesis with removal of 4 L of fluid.  Cytology was neg for malignant cells.   She returns for Cardiology follow up.  She is here with her husband (whom I have seen in the past - Dr. Aundra Dubin patient).  She notes that her chest soreness is improved.  She denies significant shortness of breath. She is not walking as much as she should.  She never heard from cardiac rehabilitation.  She denies syncope, orthopnea, PND, edema. She denies recurrent abdominal distention.    Prior CV and other studies that were reviewed today include:    Echo 03/08/16 Mild concentric LVH, EF 60-65, normal  wall motion, grade 1 diastolic dysfunction, very mild aortic stenosis suggested by gradients (mean 12), mild AI, MAC, mild MR, mild to moderate LAE, PASP 38, left pleural effusion  Abdominal CT 03/06/16 IMPRESSION: Cirrhosis.  No focal hepatic lesion is seen. Moderate to large abdominopelvic ascites. Gastroesophageal and perigastric varices.  Portal vein is patent.  Carotid US 9/17 Bilateral ICA 1-39  ABIs 9/17 Normal  LHC 9/17 Acute coronary syndrome with recent total occlusion of the dominant RCA, high-grade obstruction in the proximal LAD which Collateralizes the RCA, and high-grade obstruction and a moderate size ramus intermedius branch. Left ventricular systolic dysfunction with inferobasal moderate hypokinesis and EF of 45-50%. Upper normal LV EDP.   Past Medical History:  Diagnosis Date  . Allergy   . Arthritis    neck  . Cataract    bilateral - MD monitoring cataracts  . Chronic kidney disease, stage I    DR OTTELIN  HX UTIS  . Cirrhosis ([place])   . Cramp of limb   . Diabetes mellitus   . Dysphagia, unspecified(787.20)   . Dysuria   . Epistaxis   . GERD (gastroesophageal reflux disease)   . Heart murmur    NO CARDIOLOGIST  DX FOR YEARS ASYMPTOMATIC  . Lumbago   . Neoplasm of uncertain behavior of skin   . Nonspecific elevation of levels of transaminase or lactic acid dehydrogenase (LDH)   .  Osteoarthrosis, unspecified whether generalized or localized, unspecified site   . Other and unspecified hyperlipidemia    diet controlled  . Pain in joint, shoulder region   . Paresthesias 04/01/2015  . Postablative ovarian failure   . Trochanteric bursitis of left hip 12/15/2015  . Type 2 diabetes mellitus without complication ([place])   . Unspecified essential hypertension    no meds    Past Surgical History:  Procedure Laterality Date  . CARDIAC CATHETERIZATION N/A 01/27/2016   Procedure: Left Heart Cath and Coronary Angiography;  Surgeon: Belva Crome, MD;  Location:  [facility];  Service: Cardiovascular;  Laterality: N/A;  . COLONOSCOPY  2012   [facility] - polyp  . CORONARY ARTERY BYPASS GRAFT N/A 01/28/2016   Procedure: CORONARY ARTERY BYPASS GRAFTING (CABG) x 3 USING RIGHT LEG GREATER SAPHENOUS VEIN GRAFT;  Surgeon: Melrose Nakayama, MD;  Location: [facility];  Service: Open Heart Surgery;  Laterality: N/A;  . ENDOVEIN HARVEST OF GREATER SAPHENOUS VEIN Right 01/28/2016   Procedure: ENDOVEIN HARVEST OF GREATER SAPHENOUS VEIN;  Surgeon: Melrose Nakayama, MD;  Location: [facility];  Service: Open Heart Surgery;  Laterality: Right;  . MAXIMUM ACCESS (MAS)POSTERIOR LUMBAR INTERBODY FUSION (PLIF) 1 LEVEL Left 06/10/2015   Procedure: FOR MAXIMUM ACCESS (MAS) POSTERIOR LUMBAR INTERBODY FUSION (PLIF) LUMBAR THREE-FOUR EXTRAFORAMINAL MICRODISCECTOMY LUMBAR FIVE-SACRAL ONE LEFT;  Surgeon: Eustace Moore, MD;  Location: [facility];  Service: Neurosurgery;  Laterality: Left;  . TEE WITHOUT CARDIOVERSION N/A 01/28/2016   Procedure: TRANSESOPHAGEAL ECHOCARDIOGRAM (TEE);  Surgeon: Melrose Nakayama, MD;  Location: [facility];  Service: Open Heart Surgery;  Laterality: N/A;  . TUBAL LIGATION  1982   Dr Connye Burkitt  . UPPER GASTROINTESTINAL ENDOSCOPY    . VAGINAL HYSTERECTOMY  1997   Dr Rande Lawman    Current Medications: Current Meds  Medication Sig  . acetaminophen (TYLENOL) 325 MG tablet Take 2 tablets (650 mg total) by mouth every 6 (six) hours as needed for mild pain.  [name] Kitchen AMBULATORY NON FORMULARY MEDICATION One Touch Ultra Blue Test Strips Sig: Use to test blood sugar three times daily to control blood sugar Dx: E11.9  . aspirin EC 325 MG EC tablet Take 1 tablet (325 mg total) by mouth daily.  . Biotin 10000 MCG TABS Take 10,000 mcg by mouth every morning.  . calcium carbonate (OS-CAL) 600 MG TABS Take 600 mg by mouth 2 (two) times daily with a meal.    . cholecalciferol (VITAMIN D) 1000 UNITS tablet Take 1,000 Units by mouth daily.    . ciprofloxacin (CIPRO) 250 MG tablet  Take 250 mg by mouth 2 (two) times daily as needed (to prevent UTIs). Take one tablet after intercourse to prevent urinary tract infection  . Cyanocobalamin (VITAMIN B 12 PO) Take 1,000 mcg by mouth daily.    [name] Kitchen dextromethorphan-guaiFENesin (MUCINEX DM) 30-600 MG 12hr tablet Take 1 tablet by mouth 2 (two) times daily as needed for cough.  . fish oil-omega-3 fatty acids 1000 MG capsule Take 1 g by mouth daily.    . fluticasone (FLONASE) 50 MCG/ACT nasal spray Place 2 sprays into both nostrils daily as needed for allergies or rhinitis.  . furosemide (LASIX) 40 MG tablet Take 40 mg by mouth 2 (two) times daily.  . Glucosamine 500 MG CAPS Take 2,000 mg by mouth 2 (two) times daily.  [name] Kitchen glucose blood test strip Use as instructed to test blood sugar three times daily  Dx  250.00  . insulin detemir (LEVEMIR)  100 UNIT/ML injection Inject 45 Units into the skin at bedtime.   . Insulin Syringe-Needle U-100 (INSULIN SYRINGE .5CC/31GX5/16") 31G X 5/16" 0.5 ML MISC Use daily with the administration of insulin E11.9  . MAGNESIUM PO Take 500 mg by mouth 2 (two) times daily in the am and at bedtime..  . meloxicam (MOBIC) 15 MG tablet Take 15 mg by mouth daily.  . metFORMIN (GLUCOPHAGE) 1000 MG tablet Take one tablet by mouth twice daily to treat diabetes  . Metoprolol Tartrate 37.5 MG TABS Take 1 tablet by mouth 2 (two) times daily.  . Multiple Vitamins-Minerals (MULTIVITAMIN WITH MINERALS) tablet Take 1 tablet by mouth daily.    [name] Kitchen omeprazole (PRILOSEC) 20 MG capsule TAKE ONE CAPSULE BY MOUTH DAILY  . oxyCODONE (OXY IR/ROXICODONE) 5 MG immediate release tablet Take 1-2 tablets (5-10 mg total) by mouth every 4 (four) hours as needed for moderate pain or severe pain.  Vladimir Faster Glycol-Propyl Glycol (SYSTANE OP) Place 1 drop into both eyes 2 (two) times daily.  . potassium chloride SA (K-DUR,KLOR-CON) 20 MEQ tablet Take 20 mEq by mouth daily.  . Probiotic Product (PROBIOTIC DAILY PO) Take 1 capsule by mouth daily.  Digestive Advantage Probiotic  . spironolactone (ALDACTONE) 100 MG tablet Take 1 tablet (100 mg total) by mouth 2 (two) times daily.     Allergies:   Kiwi extract; Tdap [diphth-acell pertussis-tetanus]; Tramadol; and Latex   Social History   Social History  . Marital status: Married    Spouse name: N/A  . Number of children: 6  . Years of education: N/A   Occupational History  . retired    Social History Main Topics  . Smoking status: Never Smoker  . Smokeless tobacco: Never Used  . Alcohol use No  . Drug use: No  . Sexual activity: Yes    Partners: Male    Birth control/ protection: Post-menopausal, Surgical     Comment: Hysterectomy   Other Topics Concern  . None   Social History Narrative  . None     Family History:  The patient's family history includes Arthritis in her brother and sister; Breast cancer in her mother; Heart disease in her maternal grandfather, maternal grandmother, paternal grandfather, and paternal grandmother; Liver cancer in her brother; Lung cancer in her father.   ROS:   Please see the history of present illness.    Review of Systems  Constitution: Positive for chills.  Respiratory: Positive for cough.   Neurological: Positive for dizziness.  Psychiatric/Behavioral: The patient is nervous/anxious.    All other systems reviewed and are negative.   EKGs/Labs/Other Test Reviewed:    EKG:  EKG is   ordered today.  The ekg ordered today demonstrates NSR, HR 84, NSSTTW changes, QTc 479, PVC, similar to prior tracings.  Recent Labs: 01/29/2016: Magnesium 2.0 03/06/2016: ALT 35; B Natriuretic Peptide 307.8; Hemoglobin 9.5; Platelets 209 03/14/2016: BUN 17; Creatinine, Ser 0.77; Potassium 4.2; Sodium 137   Recent Lipid Panel    Component Value Date/Time   CHOL 198 12/11/2015 0927   CHOL 194 07/24/2015 0933   TRIG 72 12/11/2015 0927   HDL 78 12/11/2015 0927   HDL 65 07/24/2015 0933   CHOLHDL 2.5 12/11/2015 0927   VLDL 14 12/11/2015 0927    LDLCALC 106 12/11/2015 0927   LDLCALC 112 (H) 07/24/2015 0933     Physical Exam:    VS:  BP 128/60   Pulse 84   Ht 5' 4"  (1.626 m)   Wt 138  lb 12.8 oz (63 kg)   BMI 23.82 kg/m     Wt Readings from Last 3 Encounters:  04/05/16 138 lb 12.8 oz (63 kg)  03/29/16 143 lb (64.9 kg)  03/09/16 168 lb 4 oz (76.3 kg)     Physical Exam  Constitutional: She is oriented to person, place, and time. She appears well-developed and well-nourished. No distress.  HENT:  Head: Normocephalic and atraumatic.  Eyes: No scleral icterus.  Neck: No JVD present.  Cardiovascular: Normal rate and regular rhythm.   Murmur heard.  Low-pitched systolic murmur is present with a grade of 2/6  at the upper left sternal border Pulmonary/Chest: Effort normal. She has no wheezes. She has no rales.  Abdominal: Soft. There is no tenderness.  Musculoskeletal: She exhibits no edema.  Neurological: She is alert and oriented to person, place, and time.  Skin: Skin is warm and dry.  Psychiatric: She has a normal mood and affect.    ASSESSMENT:    1. Coronary artery disease involving native coronary artery of native heart without angina pectoris   2. Cirrhosis of liver with ascites, unspecified hepatic cirrhosis type ([place])   3. Pure hypercholesterolemia   4. Essential hypertension   5. PVC's (premature ventricular contractions)   6. Bilateral leg paresthesia    PLAN:    In order of problems listed above:  1. CAD - s/p NSTEMI followed by CABG in 9/17.  She is progressing well.  She never heard from cardiac rehabilitation.    -  Would avoid adding Plavix given recent dx of cirrhosis and esophageal varices.    -  Continue ASA 325 (90 days post CABG, then can reduce to 81 QD)  -  Continue beta-blocker.  2. Cirrhosis - Question if 2/2 to NASH.  She is followed by GI.  She is now on Lasix and Spironolactone.  She is s/p paracentesis.  It is ok if Metoprolol needs to be changed to Propranolol (per GI).  She notes  leg cramps.  She is on Lasix and high dose Spironolactone.  Check BMET, Mg2+ today.   3. HL - Her statin was held in the setting of newly dx cirrhosis.  I will check with Dr. [name] if ok to resume.  4. HTN - BP controlled.   5. PVCs - EF is normal now post MI/CABG.  No need to pursue Holter.  6. Paresthesias - She notes bilateral thigh numbness.  She had lumbar back surgery with Dr. Ronnald Ramp in 2/17.  I have asked her to contact him with her symptoms.     Medication Adjustments/Labs and Tests Ordered: Current medicines are reviewed at length with the patient today.  Concerns regarding medicines are outlined above.  Medication changes, Labs and Tests ordered today are outlined in the Patient Instructions noted below. Patient Instructions  Medication Instructions:  Your physician recommends that you continue on your current medications as directed. Please refer to the Current Medication list given to you today.  Labwork: TODAY BMET, MAGNESIUM LEVEL  Testing/Procedures: NONE  Follow-Up: 06/2016 WITH DR. Johnsie Cancel A REFERRAL HAS BEEN PLACED FOR CARDIAC REHAB  Any Other Special Instructions Will Be Listed Below (If Applicable).  If you need a refill on your cardiac medications before your next appointment, please call your pharmacy.   Signed, Richardson Dopp, PA-C  04/05/2016 10:06 AM    [facility] [address], [place], [place]  [address] Phone: [phone]; Fax: [phone]

## 2016-04-05 NOTE — Telephone Encounter (Signed)
I s/w pt to advise per Brynda Rim. PA and Dr. [name] it was fine for the pt to resume Lipitor 80 mg daily; FLP/LFT 3/1. Pt is agreeable to plan of care.

## 2016-04-05 NOTE — Patient Instructions (Addendum)
Medication Instructions:  Your physician recommends that you continue on your current medications as directed. Please refer to the Current Medication list given to you today.  Labwork: TODAY BMET, MAGNESIUM LEVEL  Testing/Procedures: NONE  Follow-Up: 06/2016 WITH DR. Johnsie Cancel A REFERRAL HAS BEEN PLACED FOR CARDIAC REHAB  Any Other Special Instructions Will Be Listed Below (If Applicable).  If you need a refill on your cardiac medications before your next appointment, please call your pharmacy.

## 2016-04-06 ENCOUNTER — Telehealth (HOSPITAL_COMMUNITY): Payer: Self-pay | Admitting: *Deleted

## 2016-04-08 ENCOUNTER — Ambulatory Visit (INDEPENDENT_AMBULATORY_CARE_PROVIDER_SITE_OTHER): Payer: Medicare Other | Admitting: Physician Assistant

## 2016-04-08 ENCOUNTER — Encounter: Payer: Self-pay | Admitting: Physician Assistant

## 2016-04-08 VITALS — BP 100/70 | HR 72 | Ht 62.75 in | Wt 138.6 lb

## 2016-04-08 DIAGNOSIS — I249 Acute ischemic heart disease, unspecified: Secondary | ICD-10-CM

## 2016-04-08 DIAGNOSIS — K7460 Unspecified cirrhosis of liver: Secondary | ICD-10-CM | POA: Diagnosis not present

## 2016-04-08 DIAGNOSIS — Z86010 Personal history of colonic polyps: Secondary | ICD-10-CM | POA: Diagnosis not present

## 2016-04-08 DIAGNOSIS — R197 Diarrhea, unspecified: Secondary | ICD-10-CM

## 2016-04-08 NOTE — Progress Notes (Signed)
I agree with the above note, plan  Given AMI 01/2016 I think she should not have endoscopic procedures at this time unless it is an emergency.

## 2016-04-08 NOTE — Progress Notes (Signed)
Chief Complaint: Cirrhosis  HPI:  [name] is a 70 year old Caucasian female with a past medical history of chronic kidney disease stage I, cirrhosis, diabetes, dysphagia and diabetes who returns to clinic today for follow-up of her cirrhosis with ascites. She was last seen in clinic by Dr. [name] and 03/09/2016. At that time the patient had ascites and lower extremity edema. She apparently had never been told she had significant liver disease besides fatty liver on imaging 6-7 years ago. She also described diarrhea 4-5 times a day for the past 2 weeks. Patient's cardiac surgeon the day before called in for an urgent appointment for cirrhosis and decompensated liver disease. CT scan on 10/17 with contrast showed cirrhosis without hepatic lesion. Moderate to large abdominopelvic ascites. Gastroesophageal and perigastric varices as well as a portal vein which was patent. She did have labs that showed a slightly elevated transaminases. Sodium was 135. Patient did undergo coronary bypass grafting 3 on 01/28/2016 after presenting with a non-Q-wave MI. At that time was discussed the etiology of her cirrhosis was unclear however she did have history of fatty liver disease on imaging. She was tested for other potential etiologies including viral and autoimmune etc. At time she was told to resume Lasix 40 mg twice daily and was started on spironolactone 100 mg twice a day. She was sent for paracentesis. A maximal of 4 L was to be removed. She also had stool tests for C. difficile. She was instructed to start taking a single Imodium at bedtime every night. She was told to weigh herself daily. Discussed that she would likely need an upper endoscopy in the near future as well as a colonoscopy for polyp surveillance.   Patient called our office in the interim and was doing well on Lasix 40 mg twice a day and Aldactone 100 mg twice a day. BMP completed 03/14/2016 were normal other than elevated glucose of 240. Stool  culture and C. difficile returned negative. ASMA was the only lab which returned negative from previous workup and only minimally elevated at 22. Paracentesis was performed 03/10/16 with removal of 4 L of clear yellow fluid.   Today, the patient returns to clinic accompanied by her husband and tells me that she is much better. She describes that she is no longer having extreme abdominal bloating/swelling or leg swelling. She denies any abdominal pain. She also tells me that her diarrhea has resolved. Her weight currently is 135 pounds and she has been keeping track of this on a daily basis. According to the paper she brings with her today, she has been decreasing slowly by about a pound a day. The patient tells me that occasionally she is dizzy, but believes that this is from other medications that she is taking. This is typically when she changes position. Patient tells me she is on Aldactone 100 mg twice a day and Lasix 40 mg twice a day. She is also abiding by a low-sodium diet. Her husband does tell me that it was suggested she have an EGD and colonoscopy and he is wondering when these will be scheduled.   Patient denies fever, chills, blood in her stool, melena, fatigue, anorexia, nausea, vomiting, heartburn, reflux or abdominal pain.   Past Medical History:  Diagnosis Date  . Allergy   . Arthritis    neck  . Cataract    bilateral - MD monitoring cataracts  . Chronic kidney disease, stage I    DR OTTELIN  HX UTIS  . Cirrhosis (  [place])   . Cramp of limb   . Diabetes mellitus   . Dysphagia, unspecified(787.20)   . Dysuria   . Epistaxis   . GERD (gastroesophageal reflux disease)   . Heart murmur    NO CARDIOLOGIST  DX FOR YEARS ASYMPTOMATIC  . Lumbago   . Neoplasm of uncertain behavior of skin   . Nonspecific elevation of levels of transaminase or lactic acid dehydrogenase (LDH)   . Osteoarthrosis, unspecified whether generalized or localized, unspecified site   . Other and unspecified  hyperlipidemia    diet controlled  . Pain in joint, shoulder region   . Paresthesias 04/01/2015  . Postablative ovarian failure   . Trochanteric bursitis of left hip 12/15/2015  . Type 2 diabetes mellitus without complication ([place])   . Unspecified essential hypertension    no meds    Past Surgical History:  Procedure Laterality Date  . CARDIAC CATHETERIZATION N/A 01/27/2016   Procedure: Left Heart Cath and Coronary Angiography;  Surgeon: Belva Crome, MD;  Location: [facility];  Service: Cardiovascular;  Laterality: N/A;  . COLONOSCOPY  2012   [facility] - polyp  . CORONARY ARTERY BYPASS GRAFT N/A 01/28/2016   Procedure: CORONARY ARTERY BYPASS GRAFTING (CABG) x 3 USING RIGHT LEG GREATER SAPHENOUS VEIN GRAFT;  Surgeon: Melrose Nakayama, MD;  Location: [facility];  Service: Open Heart Surgery;  Laterality: N/A;  . ENDOVEIN HARVEST OF GREATER SAPHENOUS VEIN Right 01/28/2016   Procedure: ENDOVEIN HARVEST OF GREATER SAPHENOUS VEIN;  Surgeon: Melrose Nakayama, MD;  Location: [facility];  Service: Open Heart Surgery;  Laterality: Right;  . MAXIMUM ACCESS (MAS)POSTERIOR LUMBAR INTERBODY FUSION (PLIF) 1 LEVEL Left 06/10/2015   Procedure: FOR MAXIMUM ACCESS (MAS) POSTERIOR LUMBAR INTERBODY FUSION (PLIF) LUMBAR THREE-FOUR EXTRAFORAMINAL MICRODISCECTOMY LUMBAR FIVE-SACRAL ONE LEFT;  Surgeon: Eustace Moore, MD;  Location: [facility];  Service: Neurosurgery;  Laterality: Left;  . TEE WITHOUT CARDIOVERSION N/A 01/28/2016   Procedure: TRANSESOPHAGEAL ECHOCARDIOGRAM (TEE);  Surgeon: Melrose Nakayama, MD;  Location: [facility];  Service: Open Heart Surgery;  Laterality: N/A;  . TUBAL LIGATION  1982   Dr Connye Burkitt  . UPPER GASTROINTESTINAL ENDOSCOPY    . VAGINAL HYSTERECTOMY  1997   Dr Rande Lawman    Current Outpatient Prescriptions  Medication Sig Dispense Refill  . acetaminophen (TYLENOL) 325 MG tablet Take 2 tablets (650 mg total) by mouth every 6 (six) hours as needed for mild pain.    [name] Kitchen AMBULATORY NON  FORMULARY MEDICATION One Touch Ultra Blue Test Strips Sig: Use to test blood sugar three times daily to control blood sugar Dx: E11.9 100 each 11  . aspirin EC 325 MG EC tablet Take 1 tablet (325 mg total) by mouth daily. 30 tablet 0  . atorvastatin (LIPITOR) 80 MG tablet Take 1 tablet (80 mg total) by mouth daily. 90 tablet 3  . Biotin 10000 MCG TABS Take 10,000 mcg by mouth every morning.    . calcium carbonate (OS-CAL) 600 MG TABS Take 600 mg by mouth 2 (two) times daily with a meal.      . cholecalciferol (VITAMIN D) 1000 UNITS tablet Take 1,000 Units by mouth daily.      . ciprofloxacin (CIPRO) 250 MG tablet Take 250 mg by mouth 2 (two) times daily as needed (to prevent UTIs). Take one tablet after intercourse to prevent urinary tract infection    . Cyanocobalamin (VITAMIN B 12 PO) Take 1,000 mcg by mouth daily.      [name] Kitchen  dextromethorphan-guaiFENesin (MUCINEX DM) 30-600 MG 12hr tablet Take 1 tablet by mouth 2 (two) times daily as needed for cough.    . fish oil-omega-3 fatty acids 1000 MG capsule Take 1 g by mouth daily.      . fluticasone (FLONASE) 50 MCG/ACT nasal spray Place 2 sprays into both nostrils daily as needed for allergies or rhinitis.    . furosemide (LASIX) 40 MG tablet Take 40 mg by mouth 2 (two) times daily.    . Glucosamine 500 MG CAPS Take 2,000 mg by mouth 2 (two) times daily.    [name] Kitchen glucose blood test strip Use as instructed to test blood sugar three times daily  Dx  250.00 100 each 12  . insulin detemir (LEVEMIR) 100 UNIT/ML injection Inject 45 Units into the skin at bedtime.     . Insulin Syringe-Needle U-100 (INSULIN SYRINGE .5CC/31GX5/16") 31G X 5/16" 0.5 ML MISC Use daily with the administration of insulin E11.9 100 each 11  . MAGNESIUM PO Take 500 mg by mouth 2 (two) times daily in the am and at bedtime..    . meloxicam (MOBIC) 15 MG tablet Take 15 mg by mouth daily.    . metFORMIN (GLUCOPHAGE) 1000 MG tablet Take one tablet by mouth twice daily to treat diabetes 180  tablet 3  . Metoprolol Tartrate 37.5 MG TABS Take 1 tablet by mouth 2 (two) times daily.    . Multiple Vitamins-Minerals (MULTIVITAMIN WITH MINERALS) tablet Take 1 tablet by mouth daily.      [name] Kitchen omeprazole (PRILOSEC) 20 MG capsule TAKE ONE CAPSULE BY MOUTH DAILY 90 capsule 1  . oxyCODONE (OXY IR/ROXICODONE) 5 MG immediate release tablet Take 1-2 tablets (5-10 mg total) by mouth every 4 (four) hours as needed for moderate pain or severe pain. 30 tablet 0  . Polyethyl Glycol-Propyl Glycol (SYSTANE OP) Place 1 drop into both eyes 2 (two) times daily.    . potassium chloride SA (K-DUR,KLOR-CON) 20 MEQ tablet Take 20 mEq by mouth daily.    . Probiotic Product (PROBIOTIC DAILY PO) Take 1 capsule by mouth daily. Digestive Advantage Probiotic    . spironolactone (ALDACTONE) 100 MG tablet Take 1 tablet (100 mg total) by mouth 2 (two) times daily. 60 tablet 6   No current facility-administered medications for this visit.     Allergies as of 04/08/2016 - Review Complete 04/05/2016  Allergen Reaction Noted  . Kiwi extract Anaphylaxis 05/28/2015  . Tdap [diphth-acell pertussis-tetanus] Swelling 03/27/2013  . Tramadol Nausea And Vomiting 05/28/2015  . Latex Itching, Dermatitis, and Rash 12/22/2010    Family History  Problem Relation Age of Onset  . Lung cancer Father   . Arthritis Sister   . Arthritis Brother   . Heart disease Maternal Grandmother   . Heart disease Maternal Grandfather   . Heart disease Paternal Grandmother   . Heart disease Paternal Grandfather   . Breast cancer Mother   . Liver cancer Brother   . Colon cancer Neg Hx   . Esophageal cancer Neg Hx   . Rectal cancer Neg Hx   . Stomach cancer Neg Hx     Social History   Social History  . Marital status: Married    Spouse name: N/A  . Number of children: 6  . Years of education: N/A   Occupational History  . retired    Social History Main Topics  . Smoking status: Never Smoker  . Smokeless tobacco: Never Used  .  Alcohol use No  . Drug use:  No  . Sexual activity: Yes    Partners: Male    Birth control/ protection: Post-menopausal, Surgical     Comment: Hysterectomy   Other Topics Concern  . Not on file   Social History Narrative  . No narrative on file    Review of Systems:    Constitutional: No fever, chills, weakness or fatigue Cardiovascular: No chest pain Respiratory: No SOB  Gastrointestinal: See HPI and otherwise negative   Physical Exam:  Vital signs: BP 100/70 (BP Location: Left Arm, Patient Position: Sitting, Cuff Size: Normal)   Pulse 72   Ht 5' 2.75" (1.594 m)   Wt 138 lb 9.6 oz (62.9 kg)   BMI 24.75 kg/m    Constitutional:   Pleasant Caucasian female appears to be in NAD, Well developed, Well nourished, alert and cooperative Respiratory: Respirations even and unlabored. Lungs clear to auscultation bilaterally.   No wheezes, crackles, or rhonchi.  Cardiovascular: Normal S1, S2. No MRG. Regular rate and rhythm. No peripheral edema, cyanosis or pallor.  Gastrointestinal:  Soft, nondistended, nontender. No rebound or guarding. Normal bowel sounds. No appreciable masses or hepatomegaly. Psychiatric:Demonstrates good judgement and reason without abnormal affect or behaviors.  MOST RECENT LABS: CBC    Component Value Date/Time   WBC 6.0 03/06/2016 1301   RBC 3.88 03/06/2016 1301   HGB 9.5 (L) 03/06/2016 1301   HCT 31.1 (L) 03/06/2016 1301   PLT 209 03/06/2016 1301   MCV 80.2 03/06/2016 1301   MCH 24.5 (L) 03/06/2016 1301   MCHC 30.5 03/06/2016 1301   RDW 15.9 (H) 03/06/2016 1301   LYMPHSABS 1.2 06/02/2015 1458   MONOABS 0.6 06/02/2015 1458   EOSABS 0.1 06/02/2015 1458   BASOSABS 0.0 06/02/2015 1458    CMP     Component Value Date/Time   NA 134 (L) 04/05/2016 1009   NA 140 07/24/2015 0933   K 4.1 04/05/2016 1009   CL 98 04/05/2016 1009   CO2 25 04/05/2016 1009   GLUCOSE 317 (H) 04/05/2016 1009   BUN 16 04/05/2016 1009   BUN 11 07/24/2015 0933   CREATININE  0.80 04/05/2016 1009   CALCIUM 9.8 04/05/2016 1009   PROT 7.1 03/06/2016 1301   PROT 7.2 07/24/2015 0933   ALBUMIN 2.8 (L) 03/06/2016 1301   ALBUMIN 3.7 07/24/2015 0933   AST 59 (H) 03/06/2016 1301   ALT 35 03/06/2016 1301   ALKPHOS 74 03/06/2016 1301   BILITOT 0.2 (L) 03/06/2016 1301   BILITOT 0.5 07/24/2015 0933   GFRNONAA >60 03/06/2016 1301   GFRAA >60 03/06/2016 1301    Assessment: 1. Cirrhosis:Thought to be related to fatty liver disease as other liver serologies were negative and patient does not have a history of alcohol use, currently well controlled, patient is due for EGD for variceal screening 2. Personal history of adenomatous polyps: Patient is now due for a screening colonoscopy this was delayed at time of last appointment as patient was decompensated 3. Diarrhea: Resolved  Plan: 1. Continue Aldactone 100 mg twice a day and Lasix 40 mg twice a day 2. Continue to abide by low-sodium diet and avoid alcohol 3. Will schedule repeat BMP and CBC in 1 month 4. Will discuss timing of EGD and colonoscopy with Dr. [name]. Patient did have recent heart attack and stent placement in September 2017. 5. Patient to continue her daily weights 6. Patient to follow in clinic in 1-2 months with Dr. [name] or sooner if necessary.  Ellouise Newer, PA-C [facility] 04/08/2016, 9:35 AM  Cc: Estill Dooms, MD

## 2016-04-08 NOTE — Patient Instructions (Signed)
Your physician has requested that you go to the basement for  lab work in one month. Beginning of January.   Continue Aldactone 100 mg twice a day.   Continue Lasix 40 mg twice a day.

## 2016-04-11 ENCOUNTER — Telehealth: Payer: Self-pay | Admitting: Physician Assistant

## 2016-04-11 NOTE — Telephone Encounter (Signed)
Patient notified that she does not need to have duplicates drawn.  She  Will either come here or Dr. Kyla Balzarine for repeat lab work and copies can be sent to the other office.

## 2016-04-15 ENCOUNTER — Other Ambulatory Visit: Payer: Medicare Other

## 2016-04-15 DIAGNOSIS — E785 Hyperlipidemia, unspecified: Secondary | ICD-10-CM | POA: Diagnosis not present

## 2016-04-15 DIAGNOSIS — Z794 Long term (current) use of insulin: Principal | ICD-10-CM

## 2016-04-15 DIAGNOSIS — IMO0001 Reserved for inherently not codable concepts without codable children: Secondary | ICD-10-CM

## 2016-04-15 DIAGNOSIS — E1165 Type 2 diabetes mellitus with hyperglycemia: Principal | ICD-10-CM

## 2016-04-15 LAB — BASIC METABOLIC PANEL
BUN: 19 mg/dL (ref 7–25)
CALCIUM: 9.8 mg/dL (ref 8.6–10.4)
CO2: 26 mmol/L (ref 20–31)
Chloride: 102 mmol/L (ref 98–110)
Creat: 0.79 mg/dL (ref 0.50–0.99)
GLUCOSE: 113 mg/dL — AB (ref 65–99)
Potassium: 4.3 mmol/L (ref 3.5–5.3)
SODIUM: 138 mmol/L (ref 135–146)

## 2016-04-15 LAB — LIPID PANEL
Cholesterol: 105 mg/dL (ref <200)
HDL: 38 mg/dL — ABNORMAL LOW (ref >50)
LDL CALC: 54 mg/dL (ref <100)
TRIGLYCERIDES: 67 mg/dL (ref <150)
Total CHOL/HDL Ratio: 2.8 Ratio (ref <5.0)
VLDL: 13 mg/dL (ref <30)

## 2016-04-16 LAB — HEMOGLOBIN A1C
Hgb A1c MFr Bld: 8 % — ABNORMAL HIGH (ref <5.7)
Mean Plasma Glucose: 183 mg/dL

## 2016-04-18 DIAGNOSIS — M4316 Spondylolisthesis, lumbar region: Secondary | ICD-10-CM | POA: Diagnosis not present

## 2016-04-19 ENCOUNTER — Ambulatory Visit (INDEPENDENT_AMBULATORY_CARE_PROVIDER_SITE_OTHER): Payer: Medicare Other | Admitting: Internal Medicine

## 2016-04-19 ENCOUNTER — Encounter: Payer: Self-pay | Admitting: Internal Medicine

## 2016-04-19 ENCOUNTER — Ambulatory Visit (INDEPENDENT_AMBULATORY_CARE_PROVIDER_SITE_OTHER): Payer: Medicare Other

## 2016-04-19 ENCOUNTER — Other Ambulatory Visit: Payer: Self-pay | Admitting: Internal Medicine

## 2016-04-19 VITALS — BP 132/68 | HR 60 | Temp 97.0°F | Ht 63.0 in | Wt 137.4 lb

## 2016-04-19 VITALS — BP 132/68 | HR 60 | Temp 97.0°F | Ht 63.0 in | Wt 137.0 lb

## 2016-04-19 DIAGNOSIS — I249 Acute ischemic heart disease, unspecified: Secondary | ICD-10-CM | POA: Diagnosis not present

## 2016-04-19 DIAGNOSIS — K7460 Unspecified cirrhosis of liver: Secondary | ICD-10-CM | POA: Diagnosis not present

## 2016-04-19 DIAGNOSIS — E7800 Pure hypercholesterolemia, unspecified: Secondary | ICD-10-CM

## 2016-04-19 DIAGNOSIS — Z794 Long term (current) use of insulin: Secondary | ICD-10-CM

## 2016-04-19 DIAGNOSIS — R188 Other ascites: Secondary | ICD-10-CM

## 2016-04-19 DIAGNOSIS — I10 Essential (primary) hypertension: Secondary | ICD-10-CM

## 2016-04-19 DIAGNOSIS — Z0000 Encounter for general adult medical examination without abnormal findings: Secondary | ICD-10-CM | POA: Diagnosis not present

## 2016-04-19 DIAGNOSIS — Z23 Encounter for immunization: Secondary | ICD-10-CM

## 2016-04-19 DIAGNOSIS — IMO0001 Reserved for inherently not codable concepts without codable children: Secondary | ICD-10-CM

## 2016-04-19 DIAGNOSIS — E1165 Type 2 diabetes mellitus with hyperglycemia: Secondary | ICD-10-CM

## 2016-04-19 DIAGNOSIS — M62838 Other muscle spasm: Secondary | ICD-10-CM

## 2016-04-19 DIAGNOSIS — R202 Paresthesia of skin: Secondary | ICD-10-CM

## 2016-04-19 NOTE — Patient Instructions (Addendum)
[name] , Thank you for taking time to come for your Medicare Wellness Visit. I appreciate your ongoing commitment to your health goals. Please review the following plan we discussed and let me know if I can assist you in the future.   These are the goals we discussed: Goals    . <enter goal here>          Starting 04/19/16, I would like to maintain my current lifestyle.        This is a list of the screening recommended for you and due dates:  Health Maintenance  Topic Date Due  . Shingles Vaccine  12/06/2006  . Tetanus Vaccine  01/11/2009  . Pneumonia vaccines (2 of 2 - PPSV23) 06/27/2012  . Eye exam for diabetics  07/29/2015  . Colon Cancer Screening  12/22/2015  . Complete foot exam   03/30/2016  . Urine Protein Check  07/23/2016  . Hemoglobin A1C  10/14/2016  . Mammogram  08/06/2017  . Flu Shot  Completed  . DEXA scan (bone density measurement)  Completed  .  Hepatitis C: One time screening is recommended by Center for Disease Control  (CDC) for  adults born from [birth year] through 1964.   Completed  Preventive Care for Adults  A healthy lifestyle and preventive care can promote health and wellness. Preventive health guidelines for adults include the following key practices.  . A routine yearly physical is a good way to check with your health care provider about your health and preventive screening. It is a chance to share any concerns and updates on your health and to receive a thorough exam.  . Visit your dentist for a routine exam and preventive care every 6 months. Brush your teeth twice a day and floss once a day. Good oral hygiene prevents tooth decay and gum disease.  . The frequency of eye exams is based on your age, health, family medical history, use  of contact lenses, and other factors. Follow your health care provider's ecommendations for frequency of eye exams.  . Eat a healthy diet. Foods like vegetables, fruits, whole grains, low-fat dairy products, and lean  protein foods contain the nutrients you need without too many calories. Decrease your intake of foods high in solid fats, added sugars, and salt. Eat the right amount of calories for you. Get information about a proper diet from your health care provider, if necessary.  . Regular physical exercise is one of the most important things you can do for your health. Most adults should get at least 150 minutes of moderate-intensity exercise (any activity that increases your heart rate and causes you to sweat) each week. In addition, most adults need muscle-strengthening exercises on 2 or more days a week.  Silver Sneakers may be a benefit available to you. To determine eligibility, you may visit the website: www.silversneakers.com or contact program at 415 301 4674 Mon-Fri between 8AM-8PM.   . Maintain a healthy weight. The body mass index (BMI) is a screening tool to identify possible weight problems. It provides an estimate of body fat based on height and weight. Your health care provider can find your BMI and can help you achieve or maintain a healthy weight.   For adults 20 years and older: ? A BMI below 18.5 is considered underweight. ? A BMI of 18.5 to 24.9 is normal. ? A BMI of 25 to 29.9 is considered overweight. ? A BMI of 30 and above is considered obese.   . Maintain normal blood lipids  and cholesterol levels by exercising and minimizing your intake of saturated fat. Eat a balanced diet with plenty of fruit and vegetables. Blood tests for lipids and cholesterol should begin at age 43 and be repeated every 5 years. If your lipid or cholesterol levels are high, you are over 50, or you are at high risk for heart disease, you may need your cholesterol levels checked more frequently. Ongoing high lipid and cholesterol levels should be treated with medicines if diet and exercise are not working.  . If you smoke, find out from your health care provider how to quit. If you do not use tobacco, please  do not start.  . If you choose to drink alcohol, please do not consume more than 2 drinks per day. One drink is considered to be 12 ounces (355 mL) of beer, 5 ounces (148 mL) of wine, or 1.5 ounces (44 mL) of liquor.  . If you are 65-53 years old, ask your health care provider if you should take aspirin to prevent strokes.  . Use sunscreen. Apply sunscreen liberally and repeatedly throughout the day. You should seek shade when your shadow is shorter than you. Protect yourself by wearing long sleeves, pants, a wide-brimmed hat, and sunglasses year round, whenever you are outdoors.  . Once a month, do a whole body skin exam, using a mirror to look at the skin on your back. Tell your health care provider of new moles, moles that have irregular borders, moles that are larger than a pencil eraser, or moles that have changed in shape or color.

## 2016-04-19 NOTE — Progress Notes (Signed)
Quick Notes   Health Maintenance:   Pn23 given today; Due to recent heart attack in Sept, she had to cancel her appts for eye exam, colonoscopy and hearing screen. Pt is working on getting those rescheduled.    Abnormal Screen:  None; MMSE-29/30 Passed Clock Test    Patient Concerns:   None    Nurse Concerns:     I have reviewed the information entered by the Health Advisor. I was present in the office during the time of patient interaction and was available for consultation. I agree with the documentation and advice.  Viviann Spare Nyoka Cowden, MD

## 2016-04-19 NOTE — Progress Notes (Signed)
Facility  [place]    Place of Service:   OFFICE    Allergies  Allergen Reactions  . Kiwi Extract Anaphylaxis  . Tdap [Diphth-Acell Pertussis-Tetanus] Swelling    Swelling at injection site, gets very hot  . Tramadol Nausea And Vomiting  . Latex Itching, Dermatitis and Rash    Chief Complaint  Patient presents with  . Medical Management of Chronic Issues    4 month medication management blood sugar, blood pressure, cholesterol, review labs    HPI:  S/P CABG x 3 on 01/28/16. Doing well. Had some numbness of the right thigh postoperatively.  CT of the abd and pelvis done 03/06/16 : cirrhosis with varices of the esophagus. Atheroscerosis of the aorta. Moderate to large abdominopelvic ascites. Periumbilical hernia.  Had paracentesis with removal of 4 L fluid on 03/10/16. Was put on furosemide and spironolactone. States she is comfortable now.   DM is stable.  Medications: Patient's Medications  New Prescriptions   No medications on file  Previous Medications   ACETAMINOPHEN (TYLENOL) 325 MG TABLET    Take 2 tablets (650 mg total) by mouth every 6 (six) hours as needed for mild pain.   AMBULATORY NON FORMULARY MEDICATION    One Touch Ultra Blue Test Strips Sig: Use to test blood sugar three times daily to control blood sugar Dx: E11.9   ASPIRIN EC 325 MG EC TABLET    Take 1 tablet (325 mg total) by mouth daily.   ATORVASTATIN (LIPITOR) 80 MG TABLET    Take 1 tablet (80 mg total) by mouth daily.   BIOTIN 97989 MCG TABS    Take 10,000 mcg by mouth every morning.   CALCIUM CARBONATE (OS-CAL) 600 MG TABS    Take 600 mg by mouth 2 (two) times daily with a meal.     CHOLECALCIFEROL (VITAMIN D) 1000 UNITS TABLET    Take 1,000 Units by mouth daily.     CIPROFLOXACIN (CIPRO) 250 MG TABLET    Take 250 mg by mouth 2 (two) times daily as needed (to prevent UTIs). Take one tablet after intercourse to prevent urinary tract infection   CYANOCOBALAMIN (VITAMIN B 12 PO)    Take 1,000 mcg by  mouth daily.     DEXTROMETHORPHAN-GUAIFENESIN (MUCINEX DM) 30-600 MG 12HR TABLET    Take 1 tablet by mouth 2 (two) times daily as needed for cough.   FISH OIL-OMEGA-3 FATTY ACIDS 1000 MG CAPSULE    Take 1 g by mouth daily.     FLUTICASONE (FLONASE) 50 MCG/ACT NASAL SPRAY    Place 2 sprays into both nostrils daily as needed for allergies or rhinitis.   FUROSEMIDE (LASIX) 40 MG TABLET    Take 40 mg by mouth 2 (two) times daily.   GLUCOSAMINE 500 MG CAPS    Take 2,000 mg by mouth 2 (two) times daily.   GLUCOSE BLOOD TEST STRIP    Use as instructed to test blood sugar three times daily  Dx  250.00   INSULIN DETEMIR (LEVEMIR) 100 UNIT/ML INJECTION    Inject 45 Units into the skin at bedtime.    INSULIN SYRINGE-NEEDLE U-100 (INSULIN SYRINGE .5CC/31GX5/16") 31G X 5/16" 0.5 ML MISC    Use daily with the administration of insulin E11.9   MAGNESIUM PO    Take 500 mg by mouth 2 (two) times daily in the am and at bedtime..   MELOXICAM (MOBIC) 15 MG TABLET    Take 30 mg by mouth daily. Take 30 mg  daily   METFORMIN (GLUCOPHAGE) 1000 MG TABLET    Take one tablet by mouth twice daily to treat diabetes   METOPROLOL TARTRATE 37.5 MG TABS    Take 1 tablet by mouth 2 (two) times daily.   MULTIPLE VITAMINS-MINERALS (MULTIVITAMIN WITH MINERALS) TABLET    Take 1 tablet by mouth daily.     OMEPRAZOLE (PRILOSEC) 20 MG CAPSULE    TAKE ONE CAPSULE BY MOUTH DAILY   OXYCODONE (OXY IR/ROXICODONE) 5 MG IMMEDIATE RELEASE TABLET    Take 1-2 tablets (5-10 mg total) by mouth every 4 (four) hours as needed for moderate pain or severe pain.   POLYETHYL GLYCOL-PROPYL GLYCOL (SYSTANE OP)    Place 1 drop into both eyes 2 (two) times daily.   POTASSIUM CHLORIDE SA (K-DUR,KLOR-CON) 20 MEQ TABLET    Take 20 mEq by mouth daily.   PROBIOTIC PRODUCT (PROBIOTIC DAILY PO)    Take 1 capsule by mouth daily. Digestive Advantage Probiotic   SPIRONOLACTONE (ALDACTONE) 100 MG TABLET    Take 1 tablet (100 mg total) by mouth 2 (two) times daily.    Modified Medications   No medications on file  Discontinued Medications   No medications on file    Review of Systems  Constitutional: Negative.  Negative for activity change, appetite change, chills, diaphoresis, fatigue, fever and unexpected weight change.  HENT: Positive for hearing loss and postnasal drip. Negative for congestion, ear discharge, ear pain, rhinorrhea, sore throat, tinnitus, trouble swallowing and voice change.   Eyes: Negative.  Negative for pain, redness, itching and visual disturbance.  Respiratory: Negative.  Negative for cough, choking, shortness of breath and wheezing.   Cardiovascular: Positive for chest pain. Negative for palpitations and leg swelling.       CABG x 3 01/28/16.  Gastrointestinal: Negative for abdominal distention, abdominal pain, constipation, diarrhea and nausea.       Occasional heartburn  Endocrine: Negative for cold intolerance, heat intolerance, polydipsia, polyphagia and polyuria.       Diabetic  Genitourinary: Negative for difficulty urinating, dysuria, flank pain, frequency, hematuria, pelvic pain, urgency and vaginal discharge.  Musculoskeletal: Positive for arthralgias, back pain and myalgias. Negative for gait problem, joint swelling, neck pain and neck stiffness.       Pain left greater trochanter.  Skin: Negative for color change, pallor and rash.       Complains of thinning hair  Allergic/Immunologic: Negative.   Neurological: Negative for dizziness, tremors, seizures, syncope, weakness, numbness and headaches.       Distal paresthesias of tingling and burning in the feet and lower legs  Hematological: Negative.  Negative for adenopathy. Does not bruise/bleed easily.  Psychiatric/Behavioral: Positive for sleep disturbance (insomnia). Negative for agitation, behavioral problems, confusion, dysphoric mood, hallucinations and suicidal ideas. The patient is not nervous/anxious and is not hyperactive.     Vitals:   04/19/16 1546  BP:  132/68  Pulse: 60  Temp: 97 F (36.1 C)  SpO2: 98%  Weight: 137 lb (62.1 kg)  Height: 5' 3"  (1.6 m)   Body mass index is 24.27 kg/m. Wt Readings from Last 3 Encounters:  04/19/16 137 lb (62.1 kg)  04/19/16 137 lb 6.4 oz (62.3 kg)  04/08/16 138 lb 9.6 oz (62.9 kg)      Physical Exam  Constitutional: She is oriented to person, place, and time. She appears well-developed and well-nourished. No distress.  HENT:  Head: Normocephalic and atraumatic.  Right Ear: External ear normal.  Left Ear: External ear normal.  Nose: Nose normal.  Mouth/Throat: Oropharynx is clear and moist. No oropharyngeal exudate.  Bilateral hearing aids  Eyes: Conjunctivae and EOM are normal. Pupils are equal, round, and reactive to light. No scleral icterus.  Neck: Neck supple. No JVD present. No tracheal deviation present. No thyromegaly present.  Cardiovascular: Normal rate, regular rhythm, normal heart sounds and intact distal pulses.  Exam reveals no gallop and no friction rub.   No murmur heard. Pulmonary/Chest: Effort normal and breath sounds normal. No respiratory distress. She has no wheezes. She has no rales. She exhibits no tenderness.  Abdominal: Bowel sounds are normal. She exhibits no distension and no mass. There is no tenderness.  Mild abdominal distention.  Musculoskeletal: Normal range of motion. She exhibits no edema or tenderness.  Patient has evidence of  rupture long head of the biceps proximally.in the right mid upper arm. It is not tender. Previous bruising is resolved. The muscle is balled up in the mid right upper arm. Tender to percussion in the lower back. Plan left greater trochanter to palpation.  Lymphadenopathy:    She has no cervical adenopathy.  Neurological: She is alert and oriented to person, place, and time. She has normal reflexes. No cranial nerve deficit. Coordination normal.  12/112/17 MMSE 29/30. Passed clock drawing.  Skin: No rash noted. She is not diaphoretic.  No erythema. No pallor.  Hair is quite thin over the scalp.  Psychiatric: She has a normal mood and affect. Her behavior is normal. Judgment and thought content normal.    Labs reviewed: No flowsheet data found. Lab Results  Component Value Date   TSH 2.150 06/23/2014   Lab Results  Component Value Date   BUN 19 04/15/2016   BUN 16 04/05/2016   BUN 17 03/14/2016   Lab Results  Component Value Date   HGBA1C 8.0 (H) 04/15/2016   HGBA1C 8.4 (H) 01/28/2016   HGBA1C 9.7 (H) 12/11/2015    Assessment/Plan  1. Paresthesia Unchanged and present distally in the legs  2. Muscle spasm improved  3. Essential hypertension controlled - Comprehensive metabolic panel; Future  4. Uncontrolled type 2 diabetes mellitus without complication, with long-term current use of insulin ([place]) Continue to work on diet improvement. A1c is improving. - Hemoglobin A1c; Future - Comprehensive metabolic panel; Future - Microalbumin, urine; Future  5. Cirrhosis of liver with ascites, unspecified hepatic cirrhosis type ([place]) Monitor for future weight gains and abd distention - Comprehensive metabolic panel; Future  6. Pure hypercholesterolemia - Lipid panel; Future

## 2016-04-19 NOTE — Progress Notes (Signed)
Subjective:   [name] is a 70 y.o. female who presents for an Initial Medicare Annual Wellness Visit.  Review of Systems    Cardiac Risk Factors include: advanced age (>54mn, >>41women);family history of premature cardiovascular disease;hypertension     Objective:    Today's Vitals   04/19/16 1426  BP: 132/68  Pulse: 60  Temp: 97 F (36.1 C)  TempSrc: Oral  SpO2: 98%  Weight: 137 lb 6.4 oz (62.3 kg)  Height: 5' 3"  (1.6 m)   Body mass index is 24.34 kg/m.   Current Medications (verified) Outpatient Encounter Prescriptions as of 04/19/2016  Medication Sig  . acetaminophen (TYLENOL) 325 MG tablet Take 2 tablets (650 mg total) by mouth every 6 (six) hours as needed for mild pain.  .[name] KitchenAMBULATORY NON FORMULARY MEDICATION One Touch Ultra Blue Test Strips Sig: Use to test blood sugar three times daily to control blood sugar Dx: E11.9  . aspirin EC 325 MG EC tablet Take 1 tablet (325 mg total) by mouth daily.  .[name] Kitchenatorvastatin (LIPITOR) 80 MG tablet Take 1 tablet (80 mg total) by mouth daily.  . Biotin 10000 MCG TABS Take 10,000 mcg by mouth every morning.  . calcium carbonate (OS-CAL) 600 MG TABS Take 600 mg by mouth 2 (two) times daily with a meal.    . cholecalciferol (VITAMIN D) 1000 UNITS tablet Take 1,000 Units by mouth daily.    . ciprofloxacin (CIPRO) 250 MG tablet Take 250 mg by mouth 2 (two) times daily as needed (to prevent UTIs). Take one tablet after intercourse to prevent urinary tract infection  . Cyanocobalamin (VITAMIN B 12 PO) Take 1,000 mcg by mouth daily.    .[name] Kitchendextromethorphan-guaiFENesin (MUCINEX DM) 30-600 MG 12hr tablet Take 1 tablet by mouth 2 (two) times daily as needed for cough.  . fish oil-omega-3 fatty acids 1000 MG capsule Take 1 g by mouth daily.    . fluticasone (FLONASE) 50 MCG/ACT nasal spray Place 2 sprays into both nostrils daily as needed for allergies or rhinitis.  . furosemide (LASIX) 40 MG tablet Take 40 mg by mouth 2 (two) times  daily.  . Glucosamine 500 MG CAPS Take 2,000 mg by mouth 2 (two) times daily.  .[name] Kitchenglucose blood test strip Use as instructed to test blood sugar three times daily  Dx  250.00  . insulin detemir (LEVEMIR) 100 UNIT/ML injection Inject 45 Units into the skin at bedtime.   . Insulin Syringe-Needle U-100 (INSULIN SYRINGE .5CC/31GX5/16") 31G X 5/16" 0.5 ML MISC Use daily with the administration of insulin E11.9  . MAGNESIUM PO Take 500 mg by mouth 2 (two) times daily in the am and at bedtime..  . meloxicam (MOBIC) 15 MG tablet Take 30 mg by mouth daily. Take 30 mg daily  . metFORMIN (GLUCOPHAGE) 1000 MG tablet Take one tablet by mouth twice daily to treat diabetes  . Metoprolol Tartrate 37.5 MG TABS Take 1 tablet by mouth 2 (two) times daily.  . Multiple Vitamins-Minerals (MULTIVITAMIN WITH MINERALS) tablet Take 1 tablet by mouth daily.    .[name] Kitchenomeprazole (PRILOSEC) 20 MG capsule TAKE ONE CAPSULE BY MOUTH DAILY  . oxyCODONE (OXY IR/ROXICODONE) 5 MG immediate release tablet Take 1-2 tablets (5-10 mg total) by mouth every 4 (four) hours as needed for moderate pain or severe pain.  .Vladimir FasterGlycol-Propyl Glycol (SYSTANE OP) Place 1 drop into both eyes 2 (two) times daily.  . potassium chloride SA (K-DUR,KLOR-CON) 20 MEQ tablet Take 20 mEq by  mouth daily.  . Probiotic Product (PROBIOTIC DAILY PO) Take 1 capsule by mouth daily. Digestive Advantage Probiotic  . spironolactone (ALDACTONE) 100 MG tablet Take 1 tablet (100 mg total) by mouth 2 (two) times daily.   No facility-administered encounter medications on file as of 04/19/2016.     Allergies (verified) Kiwi extract; Tdap [diphth-acell pertussis-tetanus]; Tramadol; and Latex   History: Past Medical History:  Diagnosis Date  . Allergy   . Arthritis    neck  . Cataract    bilateral - MD monitoring cataracts  . CHF (congestive heart failure) ([place])   . Chronic kidney disease, stage I    DR OTTELIN  HX UTIS  . Cirrhosis ([place])   . Cramp of limb     . Diabetes mellitus   . Dysphagia, unspecified(787.20)   . Dysuria   . Epistaxis   . GERD (gastroesophageal reflux disease)   . Heart murmur    NO CARDIOLOGIST  DX FOR YEARS ASYMPTOMATIC  . Lumbago   . Neoplasm of uncertain behavior of skin   . Nonspecific elevation of levels of transaminase or lactic acid dehydrogenase (LDH)   . Osteoarthrosis, unspecified whether generalized or localized, unspecified site   . Other and unspecified hyperlipidemia    diet controlled  . Pain in joint, shoulder region   . Paresthesias 04/01/2015  . Postablative ovarian failure   . Trochanteric bursitis of left hip 12/15/2015  . Type 2 diabetes mellitus without complication ([place])   . Unspecified essential hypertension    no meds   Past Surgical History:  Procedure Laterality Date  . CARDIAC CATHETERIZATION N/A 01/27/2016   Procedure: Left Heart Cath and Coronary Angiography;  Surgeon: Belva Crome, MD;  Location: [facility];  Service: Cardiovascular;  Laterality: N/A;  . COLONOSCOPY  2012   [facility] - polyp  . CORONARY ARTERY BYPASS GRAFT N/A 01/28/2016   Procedure: CORONARY ARTERY BYPASS GRAFTING (CABG) x 3 USING RIGHT LEG GREATER SAPHENOUS VEIN GRAFT;  Surgeon: Melrose Nakayama, MD;  Location: [facility];  Service: Open Heart Surgery;  Laterality: N/A;  . ENDOVEIN HARVEST OF GREATER SAPHENOUS VEIN Right 01/28/2016   Procedure: ENDOVEIN HARVEST OF GREATER SAPHENOUS VEIN;  Surgeon: Melrose Nakayama, MD;  Location: [facility];  Service: Open Heart Surgery;  Laterality: Right;  . MAXIMUM ACCESS (MAS)POSTERIOR LUMBAR INTERBODY FUSION (PLIF) 1 LEVEL Left 06/10/2015   Procedure: FOR MAXIMUM ACCESS (MAS) POSTERIOR LUMBAR INTERBODY FUSION (PLIF) LUMBAR THREE-FOUR EXTRAFORAMINAL MICRODISCECTOMY LUMBAR FIVE-SACRAL ONE LEFT;  Surgeon: Eustace Moore, MD;  Location: [facility];  Service: Neurosurgery;  Laterality: Left;  . TEE WITHOUT CARDIOVERSION N/A 01/28/2016   Procedure: TRANSESOPHAGEAL ECHOCARDIOGRAM (TEE);   Surgeon: Melrose Nakayama, MD;  Location: [facility];  Service: Open Heart Surgery;  Laterality: N/A;  . TUBAL LIGATION  1982   Dr Connye Burkitt  . UPPER GASTROINTESTINAL ENDOSCOPY    . VAGINAL HYSTERECTOMY  1997   Dr Rande Lawman   Family History  Problem Relation Age of Onset  . Lung cancer Father   . Arthritis Sister   . Arthritis Brother   . Heart disease Maternal Grandmother   . Heart disease Maternal Grandfather   . Heart disease Paternal Grandmother   . Heart disease Paternal Grandfather   . Breast cancer Mother   . Liver cancer Brother   . Colon cancer Neg Hx   . Esophageal cancer Neg Hx   . Rectal cancer Neg Hx   . Stomach cancer Neg Hx  Social History   Occupational History  . retired    Social History Main Topics  . Smoking status: Never Smoker  . Smokeless tobacco: Never Used  . Alcohol use No  . Drug use: No  . Sexual activity: Yes    Partners: Male    Birth control/ protection: Post-menopausal, Surgical     Comment: Hysterectomy    Tobacco Counseling Counseling given: No   Activities of Daily Living In your present state of health, do you have any difficulty performing the following activities: 04/19/2016 02/02/2016  Hearing? Tempie Donning  Vision? Y N  Difficulty concentrating or making decisions? Y N  Walking or climbing stairs? N N  Dressing or bathing? N N  Doing errands, shopping? N N  Preparing Food and eating ? N -  Using the Toilet? N -  In the past six months, have you accidently leaked urine? N -  Do you have problems with loss of bowel control? N -  Managing your Medications? N -  Managing your Finances? N -  Housekeeping or managing your Housekeeping? N -  Some recent data might be hidden    Immunizations and Health Maintenance Immunization History  Administered Date(s) Administered  . Influenza Whole 02/28/2012  . Influenza,inj,Quad PF,36+ Mos 03/27/2013, 01/16/2014, 03/31/2015, 02/26/2016  . Pneumococcal Conjugate-13 06/28/2011  . Pneumococcal  Polysaccharide-23 04/19/2016  . Td 01/12/1999   Health Maintenance Due  Topic Date Due  . ZOSTAVAX  12/06/2006  . TETANUS/TDAP  01/11/2009  . PNA vac Low Risk Adult (2 of 2 - PPSV23) 06/27/2012  . OPHTHALMOLOGY EXAM  07/29/2015  . COLONOSCOPY  12/22/2015  . FOOT EXAM  03/30/2016    Patient Care Team: Estill Dooms, MD as PCP - General (Internal Medicine) Berle Mull, MD as Consulting Physician (Family Medicine)  Indicate any recent Medical Services you may have received from other than [facility] providers in the past year (date may be approximate).     Assessment:   This is a routine wellness examination for [place]. Hearing/Vision screen  Visual Acuity Screening   Right eye Left eye Both eyes  Without correction: 20/40 20/25-1 20/25  With correction:     Comments: Pt due for eye exam in Sept, but had to cancel due to heart attack.   Hearing Screening Comments: Pt will call and make appt for hearing screen. Currently has bilateral hearing aids  Dietary issues and exercise activities discussed: Current Exercise Habits: Home exercise routine, Type of exercise: walking, Time (Minutes): 20, Frequency (Times/Week): 3, Weekly Exercise (Minutes/Week): 60, Intensity: Mild, Exercise limited by: cardiac condition(s)  Goals    . <enter goal here>          Starting 04/19/16, I would like to maintain my current lifestyle.       Depression Screen PHQ 2/9 Scores 04/19/2016 03/31/2015 11/11/2014 02/18/2014 02/18/2014 10/08/2013 12/18/2012  PHQ - 2 Score 0 0 0 0 0 0 0    Fall Risk Fall Risk  04/19/2016 03/31/2015 11/11/2014 06/25/2014 02/18/2014  Falls in the past year? No No No No No    Cognitive Function: MMSE - Mini Mental State Exam 04/19/2016  Orientation to time 5  Orientation to Place 5  Registration 3  Attention/ Calculation 5  Recall 3  Language- name 2 objects 2  Language- repeat 1  Language- follow 3 step command 2  Language- read & follow direction 1  Write a sentence 1   Copy design 1  Total score 29  Screening Tests Health Maintenance  Topic Date Due  . ZOSTAVAX  12/06/2006  . TETANUS/TDAP  01/11/2009  . PNA vac Low Risk Adult (2 of 2 - PPSV23) 06/27/2012  . OPHTHALMOLOGY EXAM  07/29/2015  . COLONOSCOPY  12/22/2015  . FOOT EXAM  03/30/2016  . URINE MICROALBUMIN  07/23/2016  . HEMOGLOBIN A1C  10/14/2016  . MAMMOGRAM  08/06/2017  . INFLUENZA VACCINE  Completed  . DEXA SCAN  Completed  . Hepatitis C Screening  Completed      Plan:    I have personally reviewed and addressed the Medicare Annual Wellness questionnaire and have noted the following in the patient's chart:  A. Medical and social history B. Use of alcohol, tobacco or illicit drugs  C. Current medications and supplements D. Functional ability and status E.  Nutritional status F.  Physical activity G. Advance directives H. List of other physicians I.  Hospitalizations, surgeries, and ER visits in previous 12 months J.  [place] to include hearing, vision, cognitive, depression L. Referrals and appointments - none  In addition, I have reviewed and discussed with patient certain preventive protocols, quality metrics, and best practice recommendations. A written personalized care plan for preventive services as well as general preventive health recommendations were provided to patient.  See attached scanned questionnaire for additional information.   Signed,   Allyn Kenner, LPN Health Advisor    I have reviewed the information entered by the Health Advisor. I was present in the office during the time of patient interaction and was available for consultation. I agree with the documentation and advice.  Viviann Spare Nyoka Cowden, MD

## 2016-04-20 ENCOUNTER — Other Ambulatory Visit: Payer: Self-pay

## 2016-04-20 DIAGNOSIS — E1165 Type 2 diabetes mellitus with hyperglycemia: Principal | ICD-10-CM

## 2016-04-20 DIAGNOSIS — IMO0001 Reserved for inherently not codable concepts without codable children: Secondary | ICD-10-CM

## 2016-04-20 DIAGNOSIS — Z794 Long term (current) use of insulin: Principal | ICD-10-CM

## 2016-04-20 MED ORDER — INSULIN DETEMIR 100 UNIT/ML ~~LOC~~ SOLN: 45.0000 [IU] | Freq: Every day | SUBCUTANEOUS | 2 refills | Status: DC

## 2016-04-25 ENCOUNTER — Other Ambulatory Visit: Payer: Self-pay | Admitting: Internal Medicine

## 2016-04-25 ENCOUNTER — Telehealth (HOSPITAL_COMMUNITY): Payer: Self-pay | Admitting: *Deleted

## 2016-04-25 NOTE — Telephone Encounter (Signed)
Contacted pt regarding upcoming appt on tomorrow for orientation to cardiac rehab.  Pt stated, " I was meaning to call you.  I do not want to do the program anymore.  I want to walk on my own."  Will cancel upcoming appts for pt. Cherre Huger, BSN

## 2016-04-26 ENCOUNTER — Inpatient Hospital Stay (HOSPITAL_COMMUNITY): Admission: RE | Admit: 2016-04-26 | Payer: Medicare Other | Source: Ambulatory Visit

## 2016-05-03 ENCOUNTER — Telehealth: Payer: Self-pay | Admitting: Cardiovascular Disease

## 2016-05-03 NOTE — Telephone Encounter (Signed)
New Message  Pt voiced wanting to speak with nurse regarding issues she is having.  Pt wants something this week, offered 05/11/16 with APP/PA but pt wants something this week.  Please f/u with pt

## 2016-05-03 NOTE — Telephone Encounter (Signed)
Patient stated she is having a lot of weakness in her legs. Patient denies any chest pain or SOB. Patient states she has some swelling in her BLE, but not all the time. Patient stated her BP and HR are fine. Made patient a sooner appointment, so she can f/u with Dr. Johnsie Cancel next week, instead of February. Encouraged patient to call her PCP as well. Patient verbalized understanding.

## 2016-05-04 ENCOUNTER — Ambulatory Visit (HOSPITAL_COMMUNITY): Payer: Medicare Other

## 2016-05-04 NOTE — Progress Notes (Deleted)
Cardiology Office Note:    Date:  05/04/2016   ID:  [name], DOB 03-26-1946, MRN [id number]  PCP:  Jeanmarie Hubert, MD  Cardiologist:  Dr. [name]   Electrophysiologist:  n/a GI:  Dr. [name]  Referring MD: Estill Dooms, MD   No chief complaint on file.   History of Present Illness:    [name] is a 70 y.o. female with a hx of CAD s/p CABG, PVCs, HTN, HL, DM2, GERD.  She initially presented with a NSTEMI in 9/17.  She had 3 V CAD on LHC with EF 45-50.  She underwent CABG with L-LAD, S-RI, S-RCA.  She was last seen in clinic by Angelena Form, PA-C in 10/17.  She was placed back on Lasix for LE edema.  She then saw Dr. Roxan Hockey for follow up and was noted to have abdominal distention/ascites.  Her Lasix was increased to 40 bid.  A follow up echocardiogram demonstrated EF 56-81, mild diastolic dysfunction, L pleural effusion, PASP 38.  There was no pericardial effusion.  Abdominal CT demonstrated findings consistent with cirrhosis, gastroesophageal perigastric varices and moderate to large abdominopelvic ascites. She has also had problems with diarrhea.  Of note, CDiff was neg.  She was referred to gastroenterology. She saw Dr. [name] 03/09/16. She was placed back on Lasix 40 twice a day and started on spironolactone 100 twice a day.  She underwent paracentesis with removal of 4 L of fluid.  Cytology was neg for malignant cells.   Complaining of some weakness in leg was started back on high dose statin by Richardson Dopp PA After discussing with Dr [name] from GI   Prior CV and other studies that were reviewed today include:    Echo 03/08/16 Mild concentric LVH, EF 60-65, normal wall motion, grade 1 diastolic dysfunction, very mild aortic stenosis suggested by gradients (mean 12), mild AI, MAC, mild MR, mild to moderate LAE, PASP 38, left pleural effusion  Abdominal CT 03/06/16 IMPRESSION: Cirrhosis.  No focal hepatic lesion is seen. Moderate to large  abdominopelvic ascites. Gastroesophageal and perigastric varices.  Portal vein is patent.  Carotid US 9/17 Bilateral ICA 1-39  ABIs 9/17 Normal  LHC 9/17 Acute coronary syndrome with recent total occlusion of the dominant RCA, high-grade obstruction in the proximal LAD which Collateralizes the RCA, and high-grade obstruction and a moderate size ramus intermedius branch. Left ventricular systolic dysfunction with inferobasal moderate hypokinesis and EF of 45-50%. Upper normal LV EDP.   Past Medical History:  Diagnosis Date  . Allergy   . Arthritis    neck  . Cataract    bilateral - MD monitoring cataracts  . CHF (congestive heart failure) ([place])   . Chronic kidney disease, stage I    DR OTTELIN  HX UTIS  . Cirrhosis ([place])   . Cramp of limb   . Diabetes mellitus   . Dysphagia, unspecified(787.20)   . Dysuria   . Epistaxis   . GERD (gastroesophageal reflux disease)   . Heart murmur    NO CARDIOLOGIST  DX FOR YEARS ASYMPTOMATIC  . Lumbago   . Neoplasm of uncertain behavior of skin   . Nonspecific elevation of levels of transaminase or lactic acid dehydrogenase (LDH)   . Osteoarthrosis, unspecified whether generalized or localized, unspecified site   . Other and unspecified hyperlipidemia    diet controlled  . Pain in joint, shoulder region   . Paresthesias 04/01/2015  . Postablative ovarian failure   . Trochanteric bursitis of  left hip 12/15/2015  . Type 2 diabetes mellitus without complication ([place])   . Unspecified essential hypertension    no meds    Past Surgical History:  Procedure Laterality Date  . CARDIAC CATHETERIZATION N/A 01/27/2016   Procedure: Left Heart Cath and Coronary Angiography;  Surgeon: Belva Crome, MD;  Location: [facility];  Service: Cardiovascular;  Laterality: N/A;  . COLONOSCOPY  2012   Jacobs - polyp  . CORONARY ARTERY BYPASS GRAFT N/A 01/28/2016   Procedure: CORONARY ARTERY BYPASS GRAFTING (CABG) x 3 USING RIGHT LEG GREATER SAPHENOUS  VEIN GRAFT;  Surgeon: Melrose Nakayama, MD;  Location: [place] [facility];  Service: Open Heart Surgery;  Laterality: N/A;  . ENDOVEIN HARVEST OF GREATER SAPHENOUS VEIN Right 01/28/2016   Procedure: ENDOVEIN HARVEST OF GREATER SAPHENOUS VEIN;  Surgeon: Melrose Nakayama, MD;  Location: [place];  Service: Open Heart Surgery;  Laterality: Right;  . MAXIMUM ACCESS (MAS)POSTERIOR LUMBAR INTERBODY FUSION (PLIF) 1 LEVEL Left 06/10/2015   Procedure: FOR MAXIMUM ACCESS (MAS) POSTERIOR LUMBAR INTERBODY FUSION (PLIF) LUMBAR THREE-FOUR EXTRAFORAMINAL MICRODISCECTOMY LUMBAR FIVE-SACRAL ONE LEFT;  Surgeon: Eustace Moore, MD;  Location: [facility];  Service: Neurosurgery;  Laterality: Left;  . TEE WITHOUT CARDIOVERSION N/A 01/28/2016   Procedure: TRANSESOPHAGEAL ECHOCARDIOGRAM (TEE);  Surgeon: Melrose Nakayama, MD;  Location: [facility];  Service: Open Heart Surgery;  Laterality: N/A;  . TUBAL LIGATION  1982   Dr Connye Burkitt  . UPPER GASTROINTESTINAL ENDOSCOPY    . VAGINAL HYSTERECTOMY  1997   Dr Rande Lawman    Current Medications: No outpatient prescriptions have been marked as taking for the 05/11/16 encounter (Appointment) with Josue Hector, MD.     Allergies:   Kiwi extract; Tdap [diphth-acell pertussis-tetanus]; Tramadol; and Latex   Social History   Social History  . Marital status: Married    Spouse name: N/A  . Number of children: 6  . Years of education: N/A   Occupational History  . retired    Social History Main Topics  . Smoking status: Never Smoker  . Smokeless tobacco: Never Used  . Alcohol use No  . Drug use: No  . Sexual activity: Yes    Partners: Male    Birth control/ protection: Post-menopausal, Surgical     Comment: Hysterectomy   Other Topics Concern  . Not on file   Social History Narrative  . No narrative on file     Family History:  The patient's family history includes Arthritis in her brother and sister; Breast cancer in her mother; Heart disease in her maternal grandfather,  maternal grandmother, paternal grandfather, and paternal grandmother; Liver cancer in her brother; Lung cancer in her father.   ROS:   Please see the history of present illness.    Review of Systems  Constitution: Positive for chills.  Respiratory: Positive for cough.   Neurological: Positive for dizziness.  Psychiatric/Behavioral: The patient is nervous/anxious.    All other systems reviewed and are negative.   EKGs/Labs/Other Test Reviewed:    EKG:  EKG is   ordered today.  The ekg ordered today demonstrates NSR, HR 84, NSSTTW changes, QTc 479, PVC, similar to prior tracings.  Recent Labs: 03/06/2016: ALT 35; B Natriuretic Peptide 307.8; Hemoglobin 9.5; Platelets 209 04/05/2016: Magnesium 1.8 04/15/2016: BUN 19; Creat 0.79; Potassium 4.3; Sodium 138   Recent Lipid Panel    Component Value Date/Time   CHOL 105 04/15/2016 1002   CHOL 194 07/24/2015 0933   TRIG 67 04/15/2016  1002   HDL 38 (L) 04/15/2016 1002   HDL 65 07/24/2015 0933   CHOLHDL 2.8 04/15/2016 1002   VLDL 13 04/15/2016 1002   LDLCALC 54 04/15/2016 1002   LDLCALC 112 (H) 07/24/2015 0933     Physical Exam:    VS:  There were no vitals taken for this visit.    Wt Readings from Last 3 Encounters:  04/19/16 137 lb (62.1 kg)  04/19/16 137 lb 6.4 oz (62.3 kg)  04/08/16 138 lb 9.6 oz (62.9 kg)     Physical Exam  Constitutional: She is oriented to person, place, and time. She appears well-developed and well-nourished. No distress.  HENT:  Head: Normocephalic and atraumatic.  Eyes: No scleral icterus.  Neck: No JVD present.  Cardiovascular: Normal rate and regular rhythm.   Murmur heard.  Low-pitched systolic murmur is present with a grade of 2/6  at the upper left sternal border Pulmonary/Chest: Effort normal. She has no wheezes. She has no rales.  Abdominal: Soft. There is no tenderness.  Musculoskeletal: She exhibits no edema.  Neurological: She is alert and oriented to person, place, and time.  Skin:  Skin is warm and dry.  Psychiatric: She has a normal mood and affect.    ASSESSMENT:    No diagnosis found. PLAN:    In order of problems listed above:  1. CAD - s/p NSTEMI followed by CABG in 9/17.  She is progressing well.     -  Reduce ASA to 81 mg   -  Continue beta-blocker.  2. Cirrhosis - Question if 2/2 to NASH.  She is followed by GI.  She is now on Lasix and Spironolactone.  She is s/p paracentesis.  It is ok if Metoprolol needs to be changed to Propranolol (per GI).  She notes leg cramps.  She is on Lasix and high dose Spironolactone.   3. HL - given cirrhosis and leg pain will hold high dose statin  Lab Results  Component Value Date   LDLCALC 54 04/15/2016     4. HTN - BP controlled.   5. PVCs - EF is normal now post MI/CABG.  No need to pursue Holter.  6. Paresthesias - She notes bilateral thigh numbness.  She had lumbar back surgery with Dr. Ronnald Ramp in 2/17.  I have asked her to contact him with her symptoms.    7. DM:  Discussed low carb diet.  Target hemoglobin A1c is 6.5 or less.  Continue current medications. Lab Results  Component Value Date   HGBA1C 8.0 (H) 04/15/2016     [name]

## 2016-05-05 ENCOUNTER — Other Ambulatory Visit: Payer: Self-pay | Admitting: *Deleted

## 2016-05-05 ENCOUNTER — Telehealth: Payer: Self-pay

## 2016-05-05 MED ORDER — METOPROLOL TARTRATE 37.5 MG PO TABS: 1.0000 | ORAL_TABLET | Freq: Two times a day (BID) | ORAL | 1 refills | Status: DC

## 2016-05-05 NOTE — Telephone Encounter (Signed)
noted 

## 2016-05-05 NOTE — Telephone Encounter (Signed)
Patient called clinical intake line c/o bilateral leg pain- muscles are sore and trouble walking. Patient  not sure if this is related to cholesterol medication.  Patient was offered appointment to be seen today at 3:15 pm and refused. Patient to be seen tomorrow at 10:30 am by Dr.Reed.  Message will be sent to PCP as a [place]

## 2016-05-06 ENCOUNTER — Encounter: Payer: Self-pay | Admitting: Internal Medicine

## 2016-05-06 ENCOUNTER — Other Ambulatory Visit: Payer: Self-pay | Admitting: Emergency Medicine

## 2016-05-06 ENCOUNTER — Ambulatory Visit (HOSPITAL_COMMUNITY): Payer: Medicare Other

## 2016-05-06 ENCOUNTER — Ambulatory Visit (INDEPENDENT_AMBULATORY_CARE_PROVIDER_SITE_OTHER): Payer: Medicare Other | Admitting: Internal Medicine

## 2016-05-06 VITALS — BP 120/70 | HR 68 | Temp 97.9°F | Wt 138.0 lb

## 2016-05-06 DIAGNOSIS — E1165 Type 2 diabetes mellitus with hyperglycemia: Secondary | ICD-10-CM

## 2016-05-06 DIAGNOSIS — I2510 Atherosclerotic heart disease of native coronary artery without angina pectoris: Secondary | ICD-10-CM | POA: Diagnosis not present

## 2016-05-06 DIAGNOSIS — T466X5A Adverse effect of antihyperlipidemic and antiarteriosclerotic drugs, initial encounter: Secondary | ICD-10-CM

## 2016-05-06 DIAGNOSIS — G720 Drug-induced myopathy: Secondary | ICD-10-CM

## 2016-05-06 DIAGNOSIS — Z981 Arthrodesis status: Secondary | ICD-10-CM

## 2016-05-06 DIAGNOSIS — R197 Diarrhea, unspecified: Secondary | ICD-10-CM

## 2016-05-06 DIAGNOSIS — E1159 Type 2 diabetes mellitus with other circulatory complications: Secondary | ICD-10-CM | POA: Diagnosis not present

## 2016-05-06 DIAGNOSIS — T466X1A Poisoning by antihyperlipidemic and antiarteriosclerotic drugs, accidental (unintentional), initial encounter: Secondary | ICD-10-CM

## 2016-05-06 DIAGNOSIS — Z794 Long term (current) use of insulin: Secondary | ICD-10-CM | POA: Diagnosis not present

## 2016-05-06 DIAGNOSIS — I249 Acute ischemic heart disease, unspecified: Secondary | ICD-10-CM | POA: Diagnosis not present

## 2016-05-06 DIAGNOSIS — IMO0002 Reserved for concepts with insufficient information to code with codable children: Secondary | ICD-10-CM

## 2016-05-06 DIAGNOSIS — K7460 Unspecified cirrhosis of liver: Secondary | ICD-10-CM

## 2016-05-06 NOTE — Progress Notes (Signed)
Location:  [facility] clinic Provider: Jerimah Witucki L. Mariea Clonts, D.O., C.M.D.  Code Status: full code Goals of Care:  Advanced Directives 04/19/2016  Does Patient Have a Medical Advance Directive? No  Would patient like information on creating a medical advance directive? -    Chief Complaint  Patient presents with  . Acute Visit    leg pain, muscle weakness    HPI: Patient is a 70 y.o. female seen today for an acute visit for leg pain and muscle weakness.  Says both hips hurt and she has muscle weakness.  She suspects it's related to her cholesterol medication.  It's making it very difficult for her to walk.  Feels weak in thigh muscles, hip joints hurt when she bends over and when she is walking.  It's interfering with her need to walk.  She had back surgery in Feb and heart surgery in Sept.  Some of it was going on when she saw Dr. Nyoka Cowden.  She was too weak to participate in Christmas meal.  Probably going on about 3 weeks.  She's been on the lipitor since Sept when she had the heart surgery.  She had two 100% blockages and one 95% blockage. She says her legs have been numb anteriorly since her CABG.  Late November she saw her neurosurgeon in f/u and was not having these symptoms then.    Past Medical History:  Diagnosis Date  . Allergy   . Arthritis    neck  . Cataract    bilateral - MD monitoring cataracts  . CHF (congestive heart failure) ([place])   . Chronic kidney disease, stage I    DR OTTELIN  HX UTIS  . Cirrhosis ([place])   . Cramp of limb   . Diabetes mellitus   . Dysphagia, unspecified(787.20)   . Dysuria   . Epistaxis   . GERD (gastroesophageal reflux disease)   . Heart murmur    NO CARDIOLOGIST  DX FOR YEARS ASYMPTOMATIC  . Lumbago   . Neoplasm of uncertain behavior of skin   . Nonspecific elevation of levels of transaminase or lactic acid dehydrogenase (LDH)   . Osteoarthrosis, unspecified whether generalized or localized, unspecified site   . Other and unspecified  hyperlipidemia    diet controlled  . Pain in joint, shoulder region   . Paresthesias 04/01/2015  . Postablative ovarian failure   . Trochanteric bursitis of left hip 12/15/2015  . Type 2 diabetes mellitus without complication ([place])   . Unspecified essential hypertension    no meds    Past Surgical History:  Procedure Laterality Date  . CARDIAC CATHETERIZATION N/A 01/27/2016   Procedure: Left Heart Cath and Coronary Angiography;  Surgeon: Belva Crome, MD;  Location: [facility];  Service: Cardiovascular;  Laterality: N/A;  . COLONOSCOPY  2012   Jacobs - polyp  . CORONARY ARTERY BYPASS GRAFT N/A 01/28/2016   Procedure: CORONARY ARTERY BYPASS GRAFTING (CABG) x 3 USING RIGHT LEG GREATER SAPHENOUS VEIN GRAFT;  Surgeon: Melrose Nakayama, MD;  Location: [place] [facility];  Service: Open Heart Surgery;  Laterality: N/A;  . ENDOVEIN HARVEST OF GREATER SAPHENOUS VEIN Right 01/28/2016   Procedure: ENDOVEIN HARVEST OF GREATER SAPHENOUS VEIN;  Surgeon: Melrose Nakayama, MD;  Location: [facility];  Service: Open Heart Surgery;  Laterality: Right;  . MAXIMUM ACCESS (MAS)POSTERIOR LUMBAR INTERBODY FUSION (PLIF) 1 LEVEL Left 06/10/2015   Procedure: FOR MAXIMUM ACCESS (MAS) POSTERIOR LUMBAR INTERBODY FUSION (PLIF) LUMBAR THREE-FOUR EXTRAFORAMINAL MICRODISCECTOMY LUMBAR FIVE-SACRAL ONE LEFT;  Surgeon: Eustace Moore, MD;  Location: [facility];  Service: Neurosurgery;  Laterality: Left;  . TEE WITHOUT CARDIOVERSION N/A 01/28/2016   Procedure: TRANSESOPHAGEAL ECHOCARDIOGRAM (TEE);  Surgeon: Melrose Nakayama, MD;  Location: [place];  Service: Open Heart Surgery;  Laterality: N/A;  . TUBAL LIGATION  1982   Dr Connye Burkitt  . UPPER GASTROINTESTINAL ENDOSCOPY    . VAGINAL HYSTERECTOMY  1997   Dr Rande Lawman    Allergies  Allergen Reactions  . Kiwi Extract Anaphylaxis  . Tdap [Diphth-Acell Pertussis-Tetanus] Swelling    Swelling at injection site, gets very hot  . Tramadol Nausea And Vomiting  . Latex Itching, Dermatitis  and Rash    Allergies as of 05/06/2016      Reactions   Kiwi Extract Anaphylaxis   Tdap [diphth-acell Pertussis-tetanus] Swelling   Swelling at injection site, gets very hot   Tramadol Nausea And Vomiting   Latex Itching, Dermatitis, Rash      Medication List       Accurate as of 05/06/16 10:57 AM. Always use your most recent med list.          acetaminophen 325 MG tablet Commonly known as:  TYLENOL Take 2 tablets (650 mg total) by mouth every 6 (six) hours as needed for mild pain.   AMBULATORY NON FORMULARY MEDICATION One Touch Ultra Blue Test Strips Sig: Use to test blood sugar three times daily to control blood sugar Dx: E11.9   aspirin 325 MG EC tablet Take 1 tablet (325 mg total) by mouth daily.   atorvastatin 80 MG tablet Commonly known as:  LIPITOR Take 1 tablet (80 mg total) by mouth daily.   Biotin 10000 MCG Tabs Take 10,000 mcg by mouth every morning.   calcium carbonate 600 MG Tabs tablet Commonly known as:  OS-CAL Take 600 mg by mouth 2 (two) times daily with a meal.   cholecalciferol 1000 units tablet Commonly known as:  VITAMIN D Take 1,000 Units by mouth daily.   ciprofloxacin 250 MG tablet Commonly known as:  CIPRO Take 250 mg by mouth 2 (two) times daily as needed (to prevent UTIs). Take one tablet after intercourse to prevent urinary tract infection   dextromethorphan-guaiFENesin 30-600 MG 12hr tablet Commonly known as:  MUCINEX DM Take 1 tablet by mouth 2 (two) times daily as needed for cough.   fish oil-omega-3 fatty acids 1000 MG capsule Take 1 g by mouth daily.   fluticasone 50 MCG/ACT nasal spray Commonly known as:  FLONASE Place 2 sprays into both nostrils daily as needed for allergies or rhinitis.   furosemide 40 MG tablet Commonly known as:  LASIX Take 40 mg by mouth 2 (two) times daily.   Glucosamine 500 MG Caps Take 2,000 mg by mouth 2 (two) times daily.   glucose blood test strip Use as instructed to test blood sugar  three times daily  Dx  250.00   insulin detemir 100 UNIT/ML injection Commonly known as:  LEVEMIR Inject 0.45 mLs (45 Units total) into the skin at bedtime.   INSULIN SYRINGE .5CC/31GX5/16" 31G X 5/16" 0.5 ML Misc Use daily with the administration of insulin E11.9   MAGNESIUM PO Take 500 mg by mouth 2 (two) times daily in the am and at bedtime..   meloxicam 15 MG tablet Commonly known as:  MOBIC TAKE 1 TABLET BY MOUTH DAILY TO HELP ARTHRITIS AND BURSITIS   metFORMIN 1000 MG tablet Commonly known as:  GLUCOPHAGE Take one tablet by mouth twice daily to  treat diabetes   Metoprolol Tartrate 37.5 MG Tabs Take 1 tablet by mouth 2 (two) times daily.   multivitamin with minerals tablet Take 1 tablet by mouth daily.   omeprazole 20 MG capsule Commonly known as:  PRILOSEC TAKE ONE CAPSULE BY MOUTH DAILY   oxyCODONE 5 MG immediate release tablet Commonly known as:  Oxy IR/ROXICODONE Take 1-2 tablets (5-10 mg total) by mouth every 4 (four) hours as needed for moderate pain or severe pain.   potassium chloride SA 20 MEQ tablet Commonly known as:  K-DUR,KLOR-CON Take 20 mEq by mouth daily.   PROBIOTIC DAILY PO Take 1 capsule by mouth daily. Digestive Advantage Probiotic   spironolactone 100 MG tablet Commonly known as:  ALDACTONE Take 1 tablet (100 mg total) by mouth 2 (two) times daily.   SYSTANE OP Place 1 drop into both eyes 2 (two) times daily.   VITAMIN B 12 PO Take 1,000 mcg by mouth daily.       Review of Systems:  Review of Systems  Constitutional: Positive for malaise/fatigue.  Respiratory: Negative for shortness of breath.   Cardiovascular: Negative for chest pain.  Gastrointestinal:       Cirrhosis  Musculoskeletal: Positive for back pain and myalgias. Negative for falls.       Weakness of thighs, hips; back pain with prolonged standing; numbness of anterior thighs  Neurological: Positive for weakness. Negative for dizziness.       Unsteady gait    Endo/Heme/Allergies:       Diabetes, uncontrolled    Health Maintenance  Topic Date Due  . FOOT EXAM  03/30/2016  . OPHTHALMOLOGY EXAM  10/06/2016 (Originally 07/29/2015)  . COLONOSCOPY  05/08/2017 (Originally 12/22/2015)  . ZOSTAVAX  05/09/2019 (Originally 12/06/2006)  . URINE MICROALBUMIN  07/23/2016  . HEMOGLOBIN A1C  10/14/2016  . MAMMOGRAM  08/06/2017  . INFLUENZA VACCINE  Completed  . DEXA SCAN  Completed  . Hepatitis C Screening  Completed  . PNA vac Low Risk Adult  Completed  . TETANUS/TDAP  Excluded    Physical Exam: Vitals:   05/06/16 1032  BP: 120/70  Pulse: 68  Temp: 97.9 F (36.6 C)  TempSrc: Oral  SpO2: 98%  Weight: 138 lb (62.6 kg)   Body mass index is 24.45 kg/m. Physical Exam  Constitutional: She is oriented to person, place, and time.  Chronically ill-appearing female; here with husband  Cardiovascular: Normal rate and regular rhythm.   Pulmonary/Chest: Effort normal.  Musculoskeletal: She exhibits tenderness.  Must use both arms to stand up out of chair; unable to flex hips against resistance in seated position; mild tenderness of bilateral hips; ambulates unsteadily w/o assistive device  Neurological: She is alert and oriented to person, place, and time. A sensory deficit is present. She exhibits abnormal muscle tone.  Diminished sensation anterior thighs  Skin: Skin is warm and dry.    Labs reviewed: Basic Metabolic Panel:  Recent Labs  01/29/16 0500 01/29/16 1700  03/14/16 1153 04/05/16 1009 04/15/16 1002  NA 134*  --   < > 137 134* 138  K 3.6  --   < > 4.2 4.1 4.3  CL 106  --   < > 101 98 102  CO2 21*  --   < > 28 25 26   GLUCOSE 116*  --   < > 240* 317* 113*  BUN 9  --   < > 17 16 19   CREATININE 0.56 0.67  < > 0.77 0.80 0.79  CALCIUM 7.6*  --   < >  9.6 9.8 9.8  MG 2.1 2.0  --   --  1.8  --   < > = values in this interval not displayed. Liver Function Tests:  Recent Labs  07/24/15 0933 03/01/16 1213 03/06/16 1301  AST 41*  45* 59*  ALT 42* 31* 35  ALKPHOS 83 75 74  BILITOT 0.5 0.6 0.2*  PROT 7.2 6.7 7.1  ALBUMIN 3.7 2.9* 2.8*    Recent Labs  03/06/16 1301  LIPASE 27   No results for input(s): AMMONIA in the last 8760 hours. CBC:  Recent Labs  05/09/15 2356 06/02/15 1458  01/31/16 0507 02/01/16 0156 03/06/16 1301  WBC 4.4 5.0  < > 9.6 6.6 6.0  NEUTROABS 2.5 3.1  --   --   --   --   HGB 11.1* 12.2  < > 9.6* 9.2* 9.5*  HCT 33.2* 36.8  < > 30.3* 29.5* 31.1*  MCV 88.3 87.6  < > 85.4 84.8 80.2  PLT 112* 131*  < > 126* 149* 209  < > = values in this interval not displayed. Lipid Panel:  Recent Labs  07/24/15 0933 12/11/15 0927 04/15/16 1002  CHOL 194 198 105  HDL 65 78 38*  LDLCALC 112* 106 54  TRIG 87 72 67  CHOLHDL 3.0 2.5 2.8   Lab Results  Component Value Date   HGBA1C 8.0 (H) 04/15/2016    Assessment/Plan 1. Statin myopathy - r/o inflammatory condition beside the statin-induced myopathy, but seems classic - has only been on statin since her CABG in sept - due to recent CABG for severe CAD, I'm hesitant to stop her lipitor w/o finding a replacement agent first--I'm going to send this to Dr. Johnsie Cancel (her appt is not until feb)--perhaps patient could go on repatha instead  - CK - Sedimentation Rate  2. Coronary artery disease involving native coronary artery of native heart without angina pectoris -s/p recent CABG--has been too weak to do cardiac rehab and actually canceled it -is now on proper CAD regimen   3. Uncontrolled type 2 diabetes mellitus with other circulatory complication, with long-term current use of insulin (HCC) Lab Results  Component Value Date   HGBA1C 8.0 (H) 04/15/2016  -cont levemir 45 units daily and metformin therapy, asa, and for now statin, but favor stopping statin in favor of alternative due to her myopathy  4. S/P lumbar spinal fusion -in Feb, reports not having numbness in thighs until after CABG not lumbar surgery -had normal f/u visit with her  neurosurgeon  Labs/tests ordered:   Orders Placed This Encounter  Procedures  . CK  . Sedimentation Rate   Next appt:  07/15/2016 with Dr. Nyoka Cowden  Ondrea Dow L. Evelynn Hench, D.O. [facility] [address],  [address] Cell Phone (Mon-Fri 8am-5pm):  [phone] On Call:  [phone] & follow prompts after 5pm & weekends Office Phone:  [phone] Office Fax:  [phone]

## 2016-05-07 ENCOUNTER — Inpatient Hospital Stay (HOSPITAL_COMMUNITY)
Admission: EM | Admit: 2016-05-07 | Discharge: 2016-05-09 | DRG: 558 | Disposition: A | Discharge status: Home / Self Care | Payer: Medicare Other | Attending: Internal Medicine | Admitting: Internal Medicine

## 2016-05-07 ENCOUNTER — Telehealth: Payer: Self-pay | Admitting: Internal Medicine

## 2016-05-07 ENCOUNTER — Encounter: Payer: Self-pay | Admitting: Internal Medicine

## 2016-05-07 ENCOUNTER — Encounter (HOSPITAL_COMMUNITY): Payer: Self-pay | Admitting: Certified Nurse Midwife

## 2016-05-07 DIAGNOSIS — M6281 Muscle weakness (generalized): Secondary | ICD-10-CM | POA: Diagnosis not present

## 2016-05-07 DIAGNOSIS — R188 Other ascites: Secondary | ICD-10-CM | POA: Diagnosis present

## 2016-05-07 DIAGNOSIS — E119 Type 2 diabetes mellitus without complications: Secondary | ICD-10-CM | POA: Diagnosis present

## 2016-05-07 DIAGNOSIS — R001 Bradycardia, unspecified: Secondary | ICD-10-CM | POA: Diagnosis present

## 2016-05-07 DIAGNOSIS — Z791 Long term (current) use of non-steroidal anti-inflammatories (NSAID): Secondary | ICD-10-CM

## 2016-05-07 DIAGNOSIS — I5032 Chronic diastolic (congestive) heart failure: Secondary | ICD-10-CM | POA: Diagnosis not present

## 2016-05-07 DIAGNOSIS — I10 Essential (primary) hypertension: Secondary | ICD-10-CM | POA: Diagnosis present

## 2016-05-07 DIAGNOSIS — Z951 Presence of aortocoronary bypass graft: Secondary | ICD-10-CM

## 2016-05-07 DIAGNOSIS — Z79899 Other long term (current) drug therapy: Secondary | ICD-10-CM

## 2016-05-07 DIAGNOSIS — M6282 Rhabdomyolysis: Secondary | ICD-10-CM | POA: Diagnosis not present

## 2016-05-07 DIAGNOSIS — I959 Hypotension, unspecified: Secondary | ICD-10-CM | POA: Diagnosis not present

## 2016-05-07 DIAGNOSIS — K7581 Nonalcoholic steatohepatitis (NASH): Secondary | ICD-10-CM | POA: Diagnosis present

## 2016-05-07 DIAGNOSIS — E785 Hyperlipidemia, unspecified: Secondary | ICD-10-CM | POA: Diagnosis present

## 2016-05-07 DIAGNOSIS — Z885 Allergy status to narcotic agent status: Secondary | ICD-10-CM

## 2016-05-07 DIAGNOSIS — I25118 Atherosclerotic heart disease of native coronary artery with other forms of angina pectoris: Secondary | ICD-10-CM | POA: Diagnosis present

## 2016-05-07 DIAGNOSIS — Z794 Long term (current) use of insulin: Secondary | ICD-10-CM

## 2016-05-07 DIAGNOSIS — M1990 Unspecified osteoarthritis, unspecified site: Secondary | ICD-10-CM | POA: Diagnosis present

## 2016-05-07 DIAGNOSIS — I130 Hypertensive heart and chronic kidney disease with heart failure and stage 1 through stage 4 chronic kidney disease, or unspecified chronic kidney disease: Secondary | ICD-10-CM | POA: Diagnosis not present

## 2016-05-07 DIAGNOSIS — E876 Hypokalemia: Secondary | ICD-10-CM | POA: Diagnosis present

## 2016-05-07 DIAGNOSIS — K7460 Unspecified cirrhosis of liver: Secondary | ICD-10-CM | POA: Diagnosis not present

## 2016-05-07 DIAGNOSIS — D508 Other iron deficiency anemias: Secondary | ICD-10-CM | POA: Diagnosis not present

## 2016-05-07 DIAGNOSIS — Z91018 Allergy to other foods: Secondary | ICD-10-CM

## 2016-05-07 DIAGNOSIS — T466X5A Adverse effect of antihyperlipidemic and antiarteriosclerotic drugs, initial encounter: Secondary | ICD-10-CM | POA: Diagnosis present

## 2016-05-07 DIAGNOSIS — D509 Iron deficiency anemia, unspecified: Secondary | ICD-10-CM | POA: Diagnosis present

## 2016-05-07 DIAGNOSIS — K219 Gastro-esophageal reflux disease without esophagitis: Secondary | ICD-10-CM | POA: Diagnosis present

## 2016-05-07 DIAGNOSIS — N181 Chronic kidney disease, stage 1: Secondary | ICD-10-CM | POA: Diagnosis present

## 2016-05-07 DIAGNOSIS — I252 Old myocardial infarction: Secondary | ICD-10-CM

## 2016-05-07 DIAGNOSIS — Z8249 Family history of ischemic heart disease and other diseases of the circulatory system: Secondary | ICD-10-CM

## 2016-05-07 DIAGNOSIS — E1165 Type 2 diabetes mellitus with hyperglycemia: Secondary | ICD-10-CM | POA: Diagnosis not present

## 2016-05-07 DIAGNOSIS — Z887 Allergy status to serum and vaccine status: Secondary | ICD-10-CM

## 2016-05-07 DIAGNOSIS — I2510 Atherosclerotic heart disease of native coronary artery without angina pectoris: Secondary | ICD-10-CM | POA: Diagnosis present

## 2016-05-07 DIAGNOSIS — Z8261 Family history of arthritis: Secondary | ICD-10-CM

## 2016-05-07 DIAGNOSIS — IMO0002 Reserved for concepts with insufficient information to code with codable children: Secondary | ICD-10-CM | POA: Diagnosis present

## 2016-05-07 DIAGNOSIS — Z91040 Latex allergy status: Secondary | ICD-10-CM

## 2016-05-07 DIAGNOSIS — Z7982 Long term (current) use of aspirin: Secondary | ICD-10-CM

## 2016-05-07 LAB — COMPREHENSIVE METABOLIC PANEL
ALK PHOS: 68 U/L (ref 38–126)
ALT: 196 U/L — AB (ref 14–54)
AST: 510 U/L — AB (ref 15–41)
Albumin: 2.8 g/dL — ABNORMAL LOW (ref 3.5–5.0)
Anion gap: 9 (ref 5–15)
BUN: 21 mg/dL — AB (ref 6–20)
CALCIUM: 9.7 mg/dL (ref 8.9–10.3)
CHLORIDE: 97 mmol/L — AB (ref 101–111)
CO2: 26 mmol/L (ref 22–32)
CREATININE: 0.94 mg/dL (ref 0.44–1.00)
GFR calc non Af Amer: >60 mL/min (ref >60)
GLUCOSE: 316 mg/dL — AB (ref 65–99)
Potassium: 4.1 mmol/L (ref 3.5–5.1)
SODIUM: 132 mmol/L — AB (ref 135–145)
Total Bilirubin: 0.6 mg/dL (ref 0.3–1.2)
Total Protein: 6.9 g/dL (ref 6.5–8.1)

## 2016-05-07 LAB — CK
CK TOTAL: 10361 U/L — AB (ref 38–234)
Total CK: 8961 U/L (ref 7–177)

## 2016-05-07 LAB — CBC WITH DIFFERENTIAL/PLATELET
BASOS ABS: 0 10*3/uL (ref 0.0–0.1)
Basophils Relative: 1 %
EOS ABS: 0.1 10*3/uL (ref 0.0–0.7)
EOS PCT: 2 %
HCT: 30 % — ABNORMAL LOW (ref 36.0–46.0)
HEMOGLOBIN: 9.2 g/dL — AB (ref 12.0–15.0)
LYMPHS ABS: 0.9 10*3/uL (ref 0.7–4.0)
LYMPHS PCT: 12 %
MCH: 23.3 pg — AB (ref 26.0–34.0)
MCHC: 30.7 g/dL (ref 30.0–36.0)
MCV: 75.9 fL — ABNORMAL LOW (ref 78.0–100.0)
Monocytes Absolute: 0.8 10*3/uL (ref 0.1–1.0)
Monocytes Relative: 11 %
NEUTROS PCT: 75 %
Neutro Abs: 5.6 10*3/uL (ref 1.7–7.7)
PLATELETS: 167 10*3/uL (ref 150–400)
RBC: 3.95 MIL/uL (ref 3.87–5.11)
RDW: 16.9 % — ABNORMAL HIGH (ref 11.5–15.5)
WBC: 7.5 10*3/uL (ref 4.0–10.5)

## 2016-05-07 LAB — RETICULOCYTES
RBC.: 3.76 MIL/uL — ABNORMAL LOW (ref 3.87–5.11)
RETIC CT PCT: 0.9 % (ref 0.4–3.1)
Retic Count, Absolute: 33.8 10*3/uL (ref 19.0–186.0)

## 2016-05-07 LAB — IRON AND TIBC
IRON: 29 ug/dL (ref 28–170)
SATURATION RATIOS: 7 % — AB (ref 10.4–31.8)
TIBC: 427 ug/dL (ref 250–450)
UIBC: 398 ug/dL

## 2016-05-07 LAB — FERRITIN: FERRITIN: 13 ng/mL (ref 11–307)

## 2016-05-07 LAB — VITAMIN B12: Vitamin B-12: 416 pg/mL (ref 180–914)

## 2016-05-07 LAB — CBG MONITORING, ED
Glucose-Capillary: 215 mg/dL — ABNORMAL HIGH (ref 65–99)
Glucose-Capillary: 259 mg/dL — ABNORMAL HIGH (ref 65–99)

## 2016-05-07 LAB — GLUCOSE, CAPILLARY: GLUCOSE-CAPILLARY: 228 mg/dL — AB (ref 65–99)

## 2016-05-07 LAB — SEDIMENTATION RATE: Sed Rate: 20 mm/hr (ref 0–30)

## 2016-05-07 MED ORDER — SODIUM CHLORIDE 0.9 % IV BOLUS (SEPSIS)
1000.0000 mL | Freq: Once | INTRAVENOUS | Status: AC
  Administered 2016-05-07: 1000 mL via INTRAVENOUS

## 2016-05-07 MED ORDER — POTASSIUM CHLORIDE CRYS ER 20 MEQ PO TBCR
20.0000 meq | EXTENDED_RELEASE_TABLET | Freq: Every day | ORAL | Status: DC
  Administered 2016-05-07 – 2016-05-08 (×2): 20 meq via ORAL
  Filled 2016-05-07 (×2): qty 1

## 2016-05-07 MED ORDER — OMEGA-3-ACID ETHYL ESTERS 1 G PO CAPS
1.0000 g | ORAL_CAPSULE | Freq: Every day | ORAL | Status: DC
  Administered 2016-05-08 – 2016-05-09 (×2): 1 g via ORAL
  Filled 2016-05-07 (×2): qty 1

## 2016-05-07 MED ORDER — METOPROLOL TARTRATE 37.5 MG PO TABS
37.5000 mg | ORAL_TABLET | Freq: Two times a day (BID) | ORAL | Status: DC
Start: 2016-05-07 — End: 2016-05-07

## 2016-05-07 MED ORDER — FUROSEMIDE 40 MG PO TABS
40.0000 mg | ORAL_TABLET | Freq: Two times a day (BID) | ORAL | Status: DC
  Filled 2016-05-07: qty 1

## 2016-05-07 MED ORDER — SODIUM CHLORIDE 0.9 % IV SOLN
INTRAVENOUS | Status: DC
  Administered 2016-05-07 – 2016-05-09 (×3): via INTRAVENOUS

## 2016-05-07 MED ORDER — METOPROLOL TARTRATE 25 MG PO TABS
25.0000 mg | ORAL_TABLET | Freq: Two times a day (BID) | ORAL | Status: DC
  Administered 2016-05-07 – 2016-05-08 (×2): 25 mg via ORAL
  Filled 2016-05-07 (×4): qty 1

## 2016-05-07 MED ORDER — PANTOPRAZOLE SODIUM 40 MG PO TBEC
40.0000 mg | DELAYED_RELEASE_TABLET | Freq: Every day | ORAL | Status: DC
  Administered 2016-05-07 – 2016-05-09 (×3): 40 mg via ORAL
  Filled 2016-05-07 (×3): qty 1

## 2016-05-07 MED ORDER — STERILE WATER FOR INJECTION IV SOLN
INTRAVENOUS | Status: DC
  Administered 2016-05-07: 16:00:00 via INTRAVENOUS
  Filled 2016-05-07 (×2): qty 850

## 2016-05-07 MED ORDER — INSULIN DETEMIR 100 UNIT/ML ~~LOC~~ SOLN
45.0000 [IU] | Freq: Every day | SUBCUTANEOUS | Status: DC
  Administered 2016-05-07 – 2016-05-08 (×2): 45 [IU] via SUBCUTANEOUS
  Filled 2016-05-07 (×2): qty 0.45

## 2016-05-07 MED ORDER — ACETAMINOPHEN 325 MG PO TABS: 325.0000 mg | ORAL_TABLET | Freq: Four times a day (QID) | ORAL | Status: DC | PRN

## 2016-05-07 MED ORDER — HEPARIN SODIUM (PORCINE) 5000 UNIT/ML IJ SOLN
5000.0000 [IU] | Freq: Three times a day (TID) | INTRAMUSCULAR | Status: DC
  Administered 2016-05-07 – 2016-05-09 (×6): 5000 [IU] via SUBCUTANEOUS
  Filled 2016-05-07 (×6): qty 1

## 2016-05-07 MED ORDER — CALCIUM CARBONATE 1250 (500 CA) MG PO TABS
1250.0000 mg | ORAL_TABLET | Freq: Two times a day (BID) | ORAL | Status: DC
  Administered 2016-05-08 – 2016-05-09 (×3): 1250 mg via ORAL
  Filled 2016-05-07 (×3): qty 1

## 2016-05-07 MED ORDER — POLYETHYL GLYCOL-PROPYL GLYCOL 0.4-0.3 % OP GEL: Freq: Two times a day (BID) | OPHTHALMIC | Status: DC

## 2016-05-07 MED ORDER — ASPIRIN EC 325 MG PO TBEC
325.0000 mg | DELAYED_RELEASE_TABLET | Freq: Every day | ORAL | Status: DC
  Administered 2016-05-07 – 2016-05-09 (×3): 325 mg via ORAL
  Filled 2016-05-07 (×3): qty 1

## 2016-05-07 MED ORDER — POLYVINYL ALCOHOL 1.4 % OP SOLN
1.0000 [drp] | OPHTHALMIC | Status: DC | PRN
  Filled 2016-05-07: qty 15

## 2016-05-07 MED ORDER — OMEGA-3 FATTY ACIDS 1000 MG PO CAPS: 1.0000 g | ORAL_CAPSULE | Freq: Every day | ORAL | Status: DC

## 2016-05-07 MED ORDER — ONDANSETRON HCL 4 MG/2ML IJ SOLN
4.0000 mg | Freq: Three times a day (TID) | INTRAMUSCULAR | Status: DC | PRN
  Administered 2016-05-07: 4 mg via INTRAVENOUS
  Filled 2016-05-07: qty 2

## 2016-05-07 MED ORDER — INSULIN ASPART 100 UNIT/ML ~~LOC~~ SOLN
0.0000 [IU] | Freq: Three times a day (TID) | SUBCUTANEOUS | Status: DC
  Administered 2016-05-07: 3 [IU] via SUBCUTANEOUS
  Administered 2016-05-08: 1 [IU] via SUBCUTANEOUS
  Administered 2016-05-08: 3 [IU] via SUBCUTANEOUS
  Administered 2016-05-08: 2 [IU] via SUBCUTANEOUS
  Filled 2016-05-07: qty 1

## 2016-05-07 NOTE — H&P (Signed)
History and Physical    NIKE SOUTHERS XTK:[id number] DOB: January 29, 1946 DOA: 05/07/2016  PCP: Jeanmarie Hubert, MD Patient coming from: home Chief Complaint: severe muscle apin and weakness  HPI: [name] is a 70 y.o. female with medical history significant of CAD, status post CABG 3 in September of this year, grade 1 diastolic CHF with EF 99-24%, hypertension, hyperlipidemia, diabetes mellitus, GERD who presented to the emergency department complaints of 3-4 week history of progressive muscle weakness and pain. She reported weakness mainly localized in the hips and thigh making it very difficult to walk and bent over. Patient was seen in her PCP office and CPK was ordered. Resultwas abnormal - 8. 961. Her pain was getting worse and patient presented to the ED for evaluation  ED Course:  on presentation she was afebrile, borderline hypotensive and bradycardic. Blood work showed microcytic anemia - Hgb 9.2, Hct 30%, MCV 75. Patient received 1L NS IV and we were asked to admit her.  Review of Systems: As per HPI otherwise 10 point review of systems negative.   Ambulatory Status: Inpatient  Past Medical History:  Diagnosis Date  . Allergy   . Arthritis    neck  . Cataract    bilateral - MD monitoring cataracts  . CHF (congestive heart failure) ([place])   . Chronic kidney disease, stage I    DR OTTELIN  HX UTIS  . Cirrhosis ([place])   . Cramp of limb   . Diabetes mellitus   . Dysphagia, unspecified(787.20)   . Dysuria   . Epistaxis   . GERD (gastroesophageal reflux disease)   . Heart murmur    NO CARDIOLOGIST  DX FOR YEARS ASYMPTOMATIC  . Lumbago   . Neoplasm of uncertain behavior of skin   . Nonspecific elevation of levels of transaminase or lactic acid dehydrogenase (LDH)   . Osteoarthrosis, unspecified whether generalized or localized, unspecified site   . Other and unspecified hyperlipidemia    diet controlled  . Pain in joint, shoulder region   . Paresthesias 04/01/2015    . Postablative ovarian failure   . Trochanteric bursitis of left hip 12/15/2015  . Type 2 diabetes mellitus without complication ([place])   . Unspecified essential hypertension    no meds    Past Surgical History:  Procedure Laterality Date  . CARDIAC CATHETERIZATION N/A 01/27/2016   Procedure: Left Heart Cath and Coronary Angiography;  Surgeon: Belva Crome, MD;  Location: [facility];  Service: Cardiovascular;  Laterality: N/A;  . COLONOSCOPY  2012   Jacobs - polyp  . CORONARY ARTERY BYPASS GRAFT N/A 01/28/2016   Procedure: CORONARY ARTERY BYPASS GRAFTING (CABG) x 3 USING RIGHT LEG GREATER SAPHENOUS VEIN GRAFT;  Surgeon: Melrose Nakayama, MD;  Location: [facility];  Service: Open Heart Surgery;  Laterality: N/A;  . ENDOVEIN HARVEST OF GREATER SAPHENOUS VEIN Right 01/28/2016   Procedure: ENDOVEIN HARVEST OF GREATER SAPHENOUS VEIN;  Surgeon: Melrose Nakayama, MD;  Location: [facility];  Service: Open Heart Surgery;  Laterality: Right;  . MAXIMUM ACCESS (MAS)POSTERIOR LUMBAR INTERBODY FUSION (PLIF) 1 LEVEL Left 06/10/2015   Procedure: FOR MAXIMUM ACCESS (MAS) POSTERIOR LUMBAR INTERBODY FUSION (PLIF) LUMBAR THREE-FOUR EXTRAFORAMINAL MICRODISCECTOMY LUMBAR FIVE-SACRAL ONE LEFT;  Surgeon: Eustace Moore, MD;  Location: [facility];  Service: Neurosurgery;  Laterality: Left;  . TEE WITHOUT CARDIOVERSION N/A 01/28/2016   Procedure: TRANSESOPHAGEAL ECHOCARDIOGRAM (TEE);  Surgeon: Melrose Nakayama, MD;  Location: [facility];  Service: Open Heart Surgery;  Laterality:  N/A;  . TUBAL LIGATION  1982   Dr Connye Burkitt  . UPPER GASTROINTESTINAL ENDOSCOPY    . VAGINAL HYSTERECTOMY  1997   Dr Rande Lawman    Social History   Social History  . Marital status: Married    Spouse name: N/A  . Number of children: 6  . Years of education: N/A   Occupational History  . retired    Social History Main Topics  . Smoking status: Never Smoker  . Smokeless tobacco: Never Used  . Alcohol use No  . Drug use: No  .  Sexual activity: Yes    Partners: Male    Birth control/ protection: Post-menopausal, Surgical     Comment: Hysterectomy   Other Topics Concern  . Not on file   Social History Narrative  . No narrative on file    Allergies  Allergen Reactions  . Kiwi Extract Anaphylaxis  . Tdap [Diphth-Acell Pertussis-Tetanus] Swelling and Other (See Comments)    Swelling at injection site, gets very hot  . Latex Itching, Dermatitis and Rash  . Tramadol Nausea And Vomiting    Family History  Problem Relation Age of Onset  . Lung cancer Father   . Arthritis Sister   . Arthritis Brother   . Heart disease Maternal Grandmother   . Heart disease Maternal Grandfather   . Heart disease Paternal Grandmother   . Heart disease Paternal Grandfather   . Breast cancer Mother   . Liver cancer Brother   . Colon cancer Neg Hx   . Esophageal cancer Neg Hx   . Rectal cancer Neg Hx   . Stomach cancer Neg Hx     Prior to Admission medications   Medication Sig Start Date End Date Taking? Authorizing Provider  Aromatic Inhalants (VICKS VAPOR IN) Inhale 1 application into the lungs at bedtime.   Yes Historical Provider, MD  aspirin EC 325 MG EC tablet Take 1 tablet (325 mg total) by mouth daily. 02/03/16  Yes Erin R Barrett, PA-C  atorvastatin (LIPITOR) 80 MG tablet Take 1 tablet (80 mg total) by mouth daily. 04/05/16 07/04/16 Yes Scott T Kathlen Mody, PA-C  Biotin 10000 MCG TABS Take 10,000 mcg by mouth every morning.   Yes Historical Provider, MD  calcium carbonate (OS-CAL) 600 MG TABS Take 600 mg by mouth 2 (two) times daily with a meal.     Yes Historical Provider, MD  cholecalciferol (VITAMIN D) 1000 UNITS tablet Take 1,000 Units by mouth daily.     Yes Historical Provider, MD  Cyanocobalamin (VITAMIN B 12 PO) Take 1,000 mcg by mouth daily.     Yes Historical Provider, MD  dextromethorphan-guaiFENesin (MUCINEX DM) 30-600 MG 12hr tablet Take 1 tablet by mouth 2 (two) times daily as needed for cough. 02/03/16  Yes  Erin R Barrett, PA-C  fish oil-omega-3 fatty acids 1000 MG capsule Take 1 g by mouth daily.     Yes Historical Provider, MD  fluticasone (FLONASE) 50 MCG/ACT nasal spray Place 2 sprays into both nostrils daily as needed for allergies or rhinitis.   Yes Historical Provider, MD  furosemide (LASIX) 40 MG tablet Take 40 mg by mouth 2 (two) times daily.   Yes Historical Provider, MD  Glucosamine 500 MG CAPS Take 2,000 mg by mouth 2 (two) times daily.   Yes Historical Provider, MD  insulin detemir (LEVEMIR) 100 UNIT/ML injection Inject 0.45 mLs (45 Units total) into the skin at bedtime. 04/20/16  Yes Estill Dooms, MD  MAGNESIUM PO Take 500 mg  by mouth 2 (two) times daily in the am and at bedtime..   Yes Historical Provider, MD  meloxicam (MOBIC) 15 MG tablet TAKE 1 TABLET BY MOUTH DAILY TO HELP ARTHRITIS AND BURSITIS Patient taking differently: TAKE 15 MG BY MOUTH DAILY TO HELP ARTHRITIS AND BURSITIS 04/25/16  Yes Tiffany L Reed, DO  metFORMIN (GLUCOPHAGE) 1000 MG tablet Take one tablet by mouth twice daily to treat diabetes Patient taking differently: Take 1,000 mg by mouth 2 (two) times daily with a meal.  05/05/15  Yes Estill Dooms, MD  Metoprolol Tartrate 37.5 MG TABS Take 1 tablet by mouth 2 (two) times daily. Patient taking differently: Take 37.5 mg by mouth 2 (two) times daily.  05/05/16  Yes Josue Hector, MD  Multiple Vitamins-Minerals (MULTIVITAMIN WITH MINERALS) tablet Take 1 tablet by mouth daily.     Yes Historical Provider, MD  omeprazole (PRILOSEC) 20 MG capsule TAKE ONE CAPSULE BY MOUTH DAILY Patient taking differently: TAKE 20 MG BY MOUTH DAILY 03/18/16  Yes Estill Dooms, MD  Polyethyl Glycol-Propyl Glycol (SYSTANE OP) Place 1 drop into both eyes 2 (two) times daily.   Yes Historical Provider, MD  potassium chloride SA (K-DUR,KLOR-CON) 20 MEQ tablet Take 20 mEq by mouth at bedtime.    Yes Historical Provider, MD  Probiotic Product (PROBIOTIC DAILY PO) Take 1 capsule by mouth  daily. Digestive Advantage Probiotic   Yes Historical Provider, MD  spironolactone (ALDACTONE) 100 MG tablet Take 1 tablet (100 mg total) by mouth 2 (two) times daily. 03/09/16 05/07/16 Yes [name], MD  acetaminophen (TYLENOL) 325 MG tablet Take 2 tablets (650 mg total) by mouth every 6 (six) hours as needed for mild pain. Patient taking differently: Take 325 mg by mouth every 6 (six) hours as needed for mild pain.  02/03/16   Erin R Barrett, PA-C  AMBULATORY NON FORMULARY MEDICATION One Touch Ultra Blue Test Strips Sig: Use to test blood sugar three times daily to control blood sugar Dx: E11.9 Patient not taking: Reported on 05/07/2016 09/29/14   Estill Dooms, MD  ciprofloxacin (CIPRO) 250 MG tablet Take 250 mg by mouth 2 (two) times daily as needed (to prevent UTIs). Take one tablet after intercourse to prevent urinary tract infection    Historical Provider, MD  glucose blood test strip Use as instructed to test blood sugar three times daily  Dx  250.00 Patient not taking: Reported on 05/07/2016 09/04/13   Estill Dooms, MD  Insulin Syringe-Needle U-100 (INSULIN SYRINGE .5CC/31GX5/16") 31G X 5/16" 0.5 ML MISC Use daily with the administration of insulin E11.9 Patient not taking: Reported on 05/07/2016 03/31/15   Estill Dooms, MD  oxyCODONE (OXY IR/ROXICODONE) 5 MG immediate release tablet Take 1-2 tablets (5-10 mg total) by mouth every 4 (four) hours as needed for moderate pain or severe pain. Patient not taking: Reported on 05/07/2016 02/03/16   Freddrick March, PA-C    Physical Exam: Vitals:   05/07/16 1136 05/07/16 1137 05/07/16 1220 05/07/16 1221  BP: 115/61  (!) 102/46   Pulse: 65   (!) 59  Temp: 98.3 F (36.8 C)     TempSrc: Oral     SpO2: 100%   98%  Weight:  62.6 kg (138 lb)    Height:  5' 3"  (1.6 m)       General: Appears calm and comfortable Eyes: PERRLA, EOMI, normal lids, iris ENT:  grossly normal hearing, lips & tongue, mucous membranes moist and intact Neck: no  lymphoadenopathy, masses or thyromegaly Cardiovascular: RRR, no m/r/g. No JVD, carotid bruits. No LE edema.  Respiratory: bilateral no wheezes, rales, rhonchi or cracles. Normal respiratory effort. No accessory muscle use observed Abdomen: soft, non-tender, non-distended, no organomegaly or masses appreciated. BS present in all quadrants Skin: no rash, ulcers or induration seen on limited exam Musculoskeletal: grossly normal tone BUE/BLE, good ROM, no bony abnormality or joint deformities observed; palpation of the bilateral hips revealed tenderness Psychiatric: grossly normal mood and affect, speech fluent and appropriate, alert and oriented x3 Neurologic: CN II-XII grossly intact, moves all extremities in coordinated fashion, sensation intact  Labs on Admission: I have personally reviewed following labs and imaging studies  CBC, BMP  GFR: CrCl cannot be calculated (Patient's most recent lab result is older than the maximum 21 days allowed.).   Creatinine Clearance: CrCl cannot be calculated (Patient's most recent lab result is older than the maximum 21 days allowed.).    Radiological Exams on Admission: No results found.  EKG: not done  Assessment/Plan Principal Problem:   Statin-induced rhabdomyolysis Active Problems:   Hyperlipemia   GERD (gastroesophageal reflux disease)   Essential hypertension   DM (diabetes mellitus), type 2, uncontrolled (HCC)   S/P CABG x 3   CAD (coronary artery disease)    Statin induced rhabdomyolysis CK was collected on admission and the result is pending Continue IV fluids Recheck CK tomorrow Monitor renal function Check UA to rule out hematuria  CAD, status post CABG 3 / HTN / diastolic CHF Clinically stable, chest pain and dyspnea free Continue Lopressor on the lower doses patient is borderline hypotensive and bradycardic  Monitor fluid volume status closely while on IV hydration   Hyperlipidemia Discontinue statin Maintain low  cholesterol diet A trial of water-soluble statin could be attempted an ambulatory setting after resolution of rhabdomyolysis  DM type II - most recent HgbA1C is 8.0% on 04/15/2016 Hold Metformin while hospitalized Continue diabetic diet, monitor FSBS QACHS, add sliding scale insulin  Microcytic anemia Will repeat Iron study Start oral Iron supplementation  DVT prophylaxis: Heparin Code Status: Full Family Communication: husband at bedside Disposition Plan: MedSurg, tele Consults called: none Admission status: observation   York Grice, [place] Pager: [phone] [facility]  If 7PM-7AM, please contact night-coverage [email or web address] Password [email or web address]  05/07/2016, 1:04 PM

## 2016-05-07 NOTE — ED Provider Notes (Signed)
[facility] DEPT Provider Note   CSN: [id number] Arrival date & time: 05/07/16  1121     History   Chief Complaint Chief Complaint  Patient presents with  . Weakness    HPI [name] is a 70 y.o. female with an extensive past medical history including CHF, CK D, cirrhosis, previous MI., who presents emergency Department with chief complaint of leg pain and weakness. The patient has had about 3 weeks of worsening bilateral proximal muscle pain in the legs with hip pain and weakness. Her pain has become so significant she is unable to ambulate. She got an appointment with her primary care office yesterday and saw Dr. Hollace Kinnier. She had a CBC, CMP, CK, and sedimentation rate performed. Her CK was elevated to almost 9000. Patient sent in for a statin-induced rhabdomyolysis. Patient did have a pneumonia vaccination just prior to the onset of these symptoms. She denies weakness elsewhere, fevers, chills, loss of appetite.  HPI  Past Medical History:  Diagnosis Date  . Allergy   . Arthritis    neck  . Cataract    bilateral - MD monitoring cataracts  . CHF (congestive heart failure) ([place])   . Chronic kidney disease, stage I    DR OTTELIN  HX UTIS  . Cirrhosis ([place])   . Cramp of limb   . Diabetes mellitus   . Dysphagia, unspecified(787.20)   . Dysuria   . Epistaxis   . GERD (gastroesophageal reflux disease)   . Heart murmur    NO CARDIOLOGIST  DX FOR YEARS ASYMPTOMATIC  . Lumbago   . Neoplasm of uncertain behavior of skin   . Nonspecific elevation of levels of transaminase or lactic acid dehydrogenase (LDH)   . Osteoarthrosis, unspecified whether generalized or localized, unspecified site   . Other and unspecified hyperlipidemia    diet controlled  . Pain in joint, shoulder region   . Paresthesias 04/01/2015  . Postablative ovarian failure   . Trochanteric bursitis of left hip 12/15/2015  . Type 2 diabetes mellitus without complication ([place])   . Unspecified  essential hypertension    no meds    Patient Active Problem List   Diagnosis Date Noted  . Cirrhosis ([place]) 04/05/2016  . PVC's (premature ventricular contractions) 04/05/2016  . Ascites 03/01/2016  . CAD (coronary artery disease) 02/16/2016  . S/P CABG x 3 02/02/2016  . History of non-ST elevation myocardial infarction (NSTEMI) 01/27/2016  . DM (diabetes mellitus), type 2, uncontrolled ([place]) 12/15/2015  . Trochanteric bursitis of left hip 12/15/2015  . S/P lumbar spinal fusion 06/10/2015  . Paresthesia 04/01/2015  . Osteoporosis 03/31/2015  . Hearing loss 06/25/2014  . Insomnia 06/25/2014  . Hair thinning 06/25/2014  . Muscle spasm 10/08/2013  . Biceps tendon rupture, proximal 11/24/2012  . Hyperlipemia   . GERD (gastroesophageal reflux disease)   . Diabetes mellitus type 2, controlled ([place])   . Essential hypertension     Past Surgical History:  Procedure Laterality Date  . CARDIAC CATHETERIZATION N/A 01/27/2016   Procedure: Left Heart Cath and Coronary Angiography;  Surgeon: Belva Crome, MD;  Location: [facility];  Service: Cardiovascular;  Laterality: N/A;  . COLONOSCOPY  2012   Jacobs - polyp  . CORONARY ARTERY BYPASS GRAFT N/A 01/28/2016   Procedure: CORONARY ARTERY BYPASS GRAFTING (CABG) x 3 USING RIGHT LEG GREATER SAPHENOUS VEIN GRAFT;  Surgeon: Melrose Nakayama, MD;  Location: [facility];  Service: Open Heart Surgery;  Laterality: N/A;  . ENDOVEIN HARVEST  OF GREATER SAPHENOUS VEIN Right 01/28/2016   Procedure: ENDOVEIN HARVEST OF GREATER SAPHENOUS VEIN;  Surgeon: Melrose Nakayama, MD;  Location: [facility];  Service: Open Heart Surgery;  Laterality: Right;  . MAXIMUM ACCESS (MAS)POSTERIOR LUMBAR INTERBODY FUSION (PLIF) 1 LEVEL Left 06/10/2015   Procedure: FOR MAXIMUM ACCESS (MAS) POSTERIOR LUMBAR INTERBODY FUSION (PLIF) LUMBAR THREE-FOUR EXTRAFORAMINAL MICRODISCECTOMY LUMBAR FIVE-SACRAL ONE LEFT;  Surgeon: Eustace Moore, MD;  Location: [facility];  Service:  Neurosurgery;  Laterality: Left;  . TEE WITHOUT CARDIOVERSION N/A 01/28/2016   Procedure: TRANSESOPHAGEAL ECHOCARDIOGRAM (TEE);  Surgeon: Melrose Nakayama, MD;  Location: [facility];  Service: Open Heart Surgery;  Laterality: N/A;  . TUBAL LIGATION  1982   Dr Connye Burkitt  . UPPER GASTROINTESTINAL ENDOSCOPY    . VAGINAL HYSTERECTOMY  1997   Dr Rande Lawman    OB History    No data available       Home Medications    Prior to Admission medications   Medication Sig Start Date End Date Taking? Authorizing Provider  acetaminophen (TYLENOL) 325 MG tablet Take 2 tablets (650 mg total) by mouth every 6 (six) hours as needed for mild pain. 02/03/16   Erin R Barrett, PA-C  AMBULATORY NON FORMULARY MEDICATION One Touch Ultra Blue Test Strips Sig: Use to test blood sugar three times daily to control blood sugar Dx: E11.9 09/29/14   Estill Dooms, MD  aspirin EC 325 MG EC tablet Take 1 tablet (325 mg total) by mouth daily. 02/03/16   Erin R Barrett, PA-C  atorvastatin (LIPITOR) 80 MG tablet Take 1 tablet (80 mg total) by mouth daily. 04/05/16 07/04/16  Liliane Shi, PA-C  Biotin 10000 MCG TABS Take 10,000 mcg by mouth every morning.    Historical Provider, MD  calcium carbonate (OS-CAL) 600 MG TABS Take 600 mg by mouth 2 (two) times daily with a meal.      Historical Provider, MD  cholecalciferol (VITAMIN D) 1000 UNITS tablet Take 1,000 Units by mouth daily.      Historical Provider, MD  ciprofloxacin (CIPRO) 250 MG tablet Take 250 mg by mouth 2 (two) times daily as needed (to prevent UTIs). Take one tablet after intercourse to prevent urinary tract infection    Historical Provider, MD  Cyanocobalamin (VITAMIN B 12 PO) Take 1,000 mcg by mouth daily.      Historical Provider, MD  dextromethorphan-guaiFENesin (MUCINEX DM) 30-600 MG 12hr tablet Take 1 tablet by mouth 2 (two) times daily as needed for cough. 02/03/16   Erin R Barrett, PA-C  fish oil-omega-3 fatty acids 1000 MG capsule Take 1 g by mouth daily.       Historical Provider, MD  fluticasone (FLONASE) 50 MCG/ACT nasal spray Place 2 sprays into both nostrils daily as needed for allergies or rhinitis.    Historical Provider, MD  furosemide (LASIX) 40 MG tablet Take 40 mg by mouth 2 (two) times daily.    Historical Provider, MD  Glucosamine 500 MG CAPS Take 2,000 mg by mouth 2 (two) times daily.    Historical Provider, MD  glucose blood test strip Use as instructed to test blood sugar three times daily  Dx  250.00 09/04/13   Estill Dooms, MD  insulin detemir (LEVEMIR) 100 UNIT/ML injection Inject 0.45 mLs (45 Units total) into the skin at bedtime. 04/20/16   Estill Dooms, MD  Insulin Syringe-Needle U-100 (INSULIN SYRINGE .5CC/31GX5/16") 31G X 5/16" 0.5 ML MISC Use daily with the administration of insulin E11.9 03/31/15  Estill Dooms, MD  MAGNESIUM PO Take 500 mg by mouth 2 (two) times daily in the am and at bedtime.[name] Kitchen    Historical Provider, MD  meloxicam (MOBIC) 15 MG tablet TAKE 1 TABLET BY MOUTH DAILY TO HELP ARTHRITIS AND BURSITIS 04/25/16   Tiffany L Reed, DO  metFORMIN (GLUCOPHAGE) 1000 MG tablet Take one tablet by mouth twice daily to treat diabetes 05/05/15   Estill Dooms, MD  Metoprolol Tartrate 37.5 MG TABS Take 1 tablet by mouth 2 (two) times daily. 05/05/16   Josue Hector, MD  Multiple Vitamins-Minerals (MULTIVITAMIN WITH MINERALS) tablet Take 1 tablet by mouth daily.      Historical Provider, MD  omeprazole (PRILOSEC) 20 MG capsule TAKE ONE CAPSULE BY MOUTH DAILY 03/18/16   Estill Dooms, MD  oxyCODONE (OXY IR/ROXICODONE) 5 MG immediate release tablet Take 1-2 tablets (5-10 mg total) by mouth every 4 (four) hours as needed for moderate pain or severe pain. 02/03/16   Erin R Barrett, PA-C  Polyethyl Glycol-Propyl Glycol (SYSTANE OP) Place 1 drop into both eyes 2 (two) times daily.    Historical Provider, MD  potassium chloride SA (K-DUR,KLOR-CON) 20 MEQ tablet Take 20 mEq by mouth daily.    Historical Provider, MD  Probiotic Product  (PROBIOTIC DAILY PO) Take 1 capsule by mouth daily. Digestive Advantage Probiotic    Historical Provider, MD  spironolactone (ALDACTONE) 100 MG tablet Take 1 tablet (100 mg total) by mouth 2 (two) times daily. 03/09/16 04/08/16  [name], MD    Family History Family History  Problem Relation Age of Onset  . Lung cancer Father   . Arthritis Sister   . Arthritis Brother   . Heart disease Maternal Grandmother   . Heart disease Maternal Grandfather   . Heart disease Paternal Grandmother   . Heart disease Paternal Grandfather   . Breast cancer Mother   . Liver cancer Brother   . Colon cancer Neg Hx   . Esophageal cancer Neg Hx   . Rectal cancer Neg Hx   . Stomach cancer Neg Hx     Social History Social History  Substance Use Topics  . Smoking status: Never Smoker  . Smokeless tobacco: Never Used  . Alcohol use No     Allergies   Kiwi extract; Tdap [diphth-acell pertussis-tetanus]; Tramadol; and Latex   Review of Systems Review of Systems  Ten systems reviewed and are negative for acute change, except as noted in the HPI.   Physical Exam Updated Vital Signs BP (!) 102/46   Pulse (!) 59   Temp 98.3 F (36.8 C) (Oral)   Ht 5' 3"  (1.6 m)   Wt 62.6 kg   SpO2 98%   BMI 24.45 kg/m   Physical Exam Physical Exam  Nursing note and vitals reviewed. Constitutional: She is oriented to person, place, and time. She appears well-developed and well-nourished. No distress.  HENT:  Head: Normocephalic and atraumatic.  Eyes: Conjunctivae normal and EOM are normal. Pupils are equal, round, and reactive to light. No scleral icterus.  Neck: Normal range of motion.  Cardiovascular: Normal rate, regular rhythm and normal heart sounds.  Exam reveals no gallop and no friction rub.   No murmur heard. Pulmonary/Chest: Effort normal and breath sounds normal. No respiratory distress.  Abdominal: Soft. Bowel sounds are normal. She exhibits no distension and no mass. There is no  tenderness. There is no guarding.  Neurological: She is alert and oriented to person, place, and time.  BL proximal muscle weakness of the hips with 3/5 strenght. 5/5 strength noted with dorsi and plantar flexion of the ankles. Unable to elicit reflexes. Decreased sensation to bilateral anterior thighs Skin: Skin is warm and dry. She is not diaphoretic.     ED Treatments / Results  Labs (all labs ordered are listed, but only abnormal results are displayed) Labs Reviewed  CBC WITH DIFFERENTIAL/PLATELET  COMPREHENSIVE METABOLIC PANEL  CK    EKG  EKG Interpretation None       Radiology No results found.  Procedures Procedures (including critical care time)  Medications Ordered in ED Medications  sodium chloride 0.9 % bolus 1,000 mL (1,000 mLs Intravenous New Bag/Given 05/07/16 1234)     Initial Impression / Assessment and Plan / ED Course  I have reviewed the triage vital signs and the nursing notes.  Pertinent labs & imaging results that were available during my care of the patient were reviewed by me and considered in my medical decision making (see chart for details).  Clinical Course     Patient with statin-induced rhabdomyolysis. (see labs ordered Within the last 24 hours) Repeating her lab values today. I Have ordered fluids. I doubt polymyalgia rheumatica. She had a negative sedimentation rate, and doubt Guilla barr, as she has normal strength in her ankles and upper extremities.  Patient will be admitted for statin-induced rhabdomyolysis. Discussed case with the hospitalist. Final Clinical Impressions(s) / ED Diagnoses   Final diagnoses:  Statin-induced rhabdomyolysis    New Prescriptions New Prescriptions   No medications on file     Margarita Mail, PA-C 05/07/16 [place], MD 05/12/16 1258

## 2016-05-07 NOTE — ED Notes (Signed)
Pt ambulatory to restroom without difficulty.

## 2016-05-07 NOTE — ED Notes (Signed)
Will transport pt at 7:20 PM.

## 2016-05-07 NOTE — ED Triage Notes (Signed)
Pt states she has leg weakness and altered gait for 2 weeks. Pt saw her PCP yesterday and had labs done. Pt states PCP called this AM and told her to come to the ER for evaluation.

## 2016-05-07 NOTE — ED Notes (Signed)
Ordered meal tray.

## 2016-05-07 NOTE — ED Notes (Addendum)
Dr. Aggie Moats paged regarding order for pt BG

## 2016-05-07 NOTE — ED Notes (Signed)
Pt ambulated to bathroom with RN

## 2016-05-07 NOTE — Progress Notes (Signed)
Received report from [facility]

## 2016-05-08 DIAGNOSIS — E0865 Diabetes mellitus due to underlying condition with hyperglycemia: Secondary | ICD-10-CM

## 2016-05-08 DIAGNOSIS — Z885 Allergy status to narcotic agent status: Secondary | ICD-10-CM | POA: Diagnosis not present

## 2016-05-08 DIAGNOSIS — Z794 Long term (current) use of insulin: Secondary | ICD-10-CM

## 2016-05-08 DIAGNOSIS — M6281 Muscle weakness (generalized): Secondary | ICD-10-CM | POA: Diagnosis not present

## 2016-05-08 DIAGNOSIS — K219 Gastro-esophageal reflux disease without esophagitis: Secondary | ICD-10-CM | POA: Diagnosis present

## 2016-05-08 DIAGNOSIS — R001 Bradycardia, unspecified: Secondary | ICD-10-CM | POA: Diagnosis present

## 2016-05-08 DIAGNOSIS — Z887 Allergy status to serum and vaccine status: Secondary | ICD-10-CM | POA: Diagnosis not present

## 2016-05-08 DIAGNOSIS — E1165 Type 2 diabetes mellitus with hyperglycemia: Secondary | ICD-10-CM | POA: Diagnosis present

## 2016-05-08 DIAGNOSIS — K7581 Nonalcoholic steatohepatitis (NASH): Secondary | ICD-10-CM | POA: Diagnosis present

## 2016-05-08 DIAGNOSIS — D508 Other iron deficiency anemias: Secondary | ICD-10-CM | POA: Diagnosis not present

## 2016-05-08 DIAGNOSIS — Z951 Presence of aortocoronary bypass graft: Secondary | ICD-10-CM | POA: Diagnosis not present

## 2016-05-08 DIAGNOSIS — Z91040 Latex allergy status: Secondary | ICD-10-CM | POA: Diagnosis not present

## 2016-05-08 DIAGNOSIS — E876 Hypokalemia: Secondary | ICD-10-CM | POA: Diagnosis present

## 2016-05-08 DIAGNOSIS — I252 Old myocardial infarction: Secondary | ICD-10-CM | POA: Diagnosis not present

## 2016-05-08 DIAGNOSIS — T466X5A Adverse effect of antihyperlipidemic and antiarteriosclerotic drugs, initial encounter: Secondary | ICD-10-CM | POA: Diagnosis present

## 2016-05-08 DIAGNOSIS — I130 Hypertensive heart and chronic kidney disease with heart failure and stage 1 through stage 4 chronic kidney disease, or unspecified chronic kidney disease: Secondary | ICD-10-CM | POA: Diagnosis present

## 2016-05-08 DIAGNOSIS — D509 Iron deficiency anemia, unspecified: Secondary | ICD-10-CM | POA: Diagnosis present

## 2016-05-08 DIAGNOSIS — M6282 Rhabdomyolysis: Secondary | ICD-10-CM | POA: Diagnosis not present

## 2016-05-08 DIAGNOSIS — M1990 Unspecified osteoarthritis, unspecified site: Secondary | ICD-10-CM | POA: Diagnosis present

## 2016-05-08 DIAGNOSIS — K7460 Unspecified cirrhosis of liver: Secondary | ICD-10-CM | POA: Diagnosis not present

## 2016-05-08 DIAGNOSIS — I959 Hypotension, unspecified: Secondary | ICD-10-CM | POA: Diagnosis present

## 2016-05-08 DIAGNOSIS — E785 Hyperlipidemia, unspecified: Secondary | ICD-10-CM | POA: Diagnosis present

## 2016-05-08 DIAGNOSIS — Z8261 Family history of arthritis: Secondary | ICD-10-CM | POA: Diagnosis not present

## 2016-05-08 DIAGNOSIS — Z8249 Family history of ischemic heart disease and other diseases of the circulatory system: Secondary | ICD-10-CM | POA: Diagnosis not present

## 2016-05-08 DIAGNOSIS — N181 Chronic kidney disease, stage 1: Secondary | ICD-10-CM | POA: Diagnosis present

## 2016-05-08 DIAGNOSIS — I5032 Chronic diastolic (congestive) heart failure: Secondary | ICD-10-CM | POA: Diagnosis present

## 2016-05-08 DIAGNOSIS — R188 Other ascites: Secondary | ICD-10-CM | POA: Diagnosis present

## 2016-05-08 DIAGNOSIS — I2510 Atherosclerotic heart disease of native coronary artery without angina pectoris: Secondary | ICD-10-CM | POA: Diagnosis present

## 2016-05-08 LAB — COMPREHENSIVE METABOLIC PANEL
ALK PHOS: 53 U/L (ref 38–126)
ALT: 191 U/L — ABNORMAL HIGH (ref 14–54)
ANION GAP: 8 (ref 5–15)
AST: 408 U/L — ABNORMAL HIGH (ref 15–41)
Albumin: 2.5 g/dL — ABNORMAL LOW (ref 3.5–5.0)
BILIRUBIN TOTAL: 0.8 mg/dL (ref 0.3–1.2)
BUN: 19 mg/dL (ref 6–20)
CALCIUM: 9 mg/dL (ref 8.9–10.3)
CO2: 25 mmol/L (ref 22–32)
Chloride: 100 mmol/L — ABNORMAL LOW (ref 101–111)
Creatinine, Ser: 0.8 mg/dL (ref 0.44–1.00)
Glucose, Bld: 196 mg/dL — ABNORMAL HIGH (ref 65–99)
Potassium: 3.9 mmol/L (ref 3.5–5.1)
Sodium: 133 mmol/L — ABNORMAL LOW (ref 135–145)
TOTAL PROTEIN: 6.5 g/dL (ref 6.5–8.1)

## 2016-05-08 LAB — CBC
HCT: 28.6 % — ABNORMAL LOW (ref 36.0–46.0)
Hemoglobin: 8.9 g/dL — ABNORMAL LOW (ref 12.0–15.0)
MCH: 23.5 pg — ABNORMAL LOW (ref 26.0–34.0)
MCHC: 31.1 g/dL (ref 30.0–36.0)
MCV: 75.7 fL — ABNORMAL LOW (ref 78.0–100.0)
Platelets: 124 10*3/uL — ABNORMAL LOW (ref 150–400)
RBC: 3.78 MIL/uL — AB (ref 3.87–5.11)
RDW: 17.5 % — ABNORMAL HIGH (ref 11.5–15.5)
WBC: 8.1 10*3/uL (ref 4.0–10.5)

## 2016-05-08 LAB — GLUCOSE, CAPILLARY
GLUCOSE-CAPILLARY: 196 mg/dL — AB (ref 65–99)
GLUCOSE-CAPILLARY: 136 mg/dL — AB (ref 65–99)
GLUCOSE-CAPILLARY: 231 mg/dL — AB (ref 65–99)
Glucose-Capillary: 215 mg/dL — ABNORMAL HIGH (ref 65–99)

## 2016-05-08 LAB — PHOSPHORUS: Phosphorus: 2.7 mg/dL (ref 2.5–4.6)

## 2016-05-08 LAB — MAGNESIUM: MAGNESIUM: 1.8 mg/dL (ref 1.7–2.4)

## 2016-05-08 LAB — CK: CK TOTAL: 7386 U/L — AB (ref 38–234)

## 2016-05-08 NOTE — Progress Notes (Signed)
PROGRESS NOTE                                                                                                                                                                                                             Patient Demographics:    [name], is a 70 y.o. female, DOB - 06/17/1945, GBT:[id number]  Admit date - 05/07/2016   Admitting Physician Elwin Mocha, MD  Outpatient Primary MD for the patient is Jeanmarie Hubert, MD  LOS - 0  Outpatient Specialists: Dr Nishan(Cardiology)  Chief Complaint  Patient presents with  . Weakness       Brief Narrative   70 year old female with history of CAD with CABG in September 2017 after presenting with non-Q-wave MI, diastolic CHF, cirrhosis with decompensated liver disease (suspected to be NASH, negative serologies and hepatitis panel with GI planning on EGD in future) who presents with severe muscle pain and weakness for almost 3-4 week duration. Patient found to have significant rhabdomyolysis and observed on hospitalist service.   Subjective:   Patient still has pain and weakness involving the hips and thighs but better since yesterday.   Assessment  & Plan :    Principal Problem:   Statin-induced rhabdomyolysis Place her on IV hydration with normal saline. Discontinue statin and patient should not be on it. CK trending down. Continue to monitor.  No signs of volume overload. Patient is on Lasix and Aldactone for abdominal ascites. (Appears stable at present).   Active Problems: CAD with history of CABG in September 2017 On aspirin and metoprolol. Discontinued statin.  Cirrhosis with ascites Suspected due to NASH. CT abdomen showing gastroesophageal and perigastric varices. Serological workup done as outpatient were unremarkable. Follows with Dr. [name] with plan on EGD as outpatient. Holding Lasix and Aldactone given soft blood pressure.     DM  (diabetes mellitus), type 2, uncontrolled ([place]) Continue home dose Lantus and sliding scale coverage. A1c of 8.  Microcytic anemia Secondary to iron deficiency. Check stool for Hemoccult. Colonoscopy in 2012 showed tubular adenoma. Needs iron supplement upon discharge. Outpatient GI follow-up. [name] Kitchen GERD Continue PPI  Hypokalemia Replenished  Generalized weakness PT eval   Code Status : Full code  Family Communication  : Husband at bedside  Disposition Plan  : Home once improved possibly in the next 2548 hours  Barriers For  Discharge : Active symptoms  Consults  : None  Procedures  : None  DVT Prophylaxis  :  SCDs  Lab Results  Component Value Date   PLT 124 (L) 05/08/2016    Antibiotics  :    Anti-infectives    None        Objective:   Vitals:   05/07/16 1945 05/07/16 2129 05/08/16 0528 05/08/16 0958  BP: (!) 149/68 (!) 112/53 (!) 96/50 (!) 101/45  Pulse: 73 78 88 75  Resp: 18 18 18    Temp:  98.6 F (37 C) 99.5 F (37.5 C)   TempSrc:  Oral Oral   SpO2: 100% 96% 100%   Weight:      Height:        Wt Readings from Last 3 Encounters:  05/07/16 62.6 kg (138 lb)  05/06/16 62.6 kg (138 lb)  04/19/16 62.1 kg (137 lb)     Intake/Output Summary (Last 24 hours) at 05/08/16 1042 Last data filed at 05/07/16 1405  Gross per 24 hour  Intake             1000 ml  Output                0 ml  Net             1000 ml     Physical Exam  Gen: not in distress,Appears fatigued HEENT: Pallor present, moist mucosa, supple neck Chest: clear b/l, no added sounds CVS: N T4&H9, systolic murmur 3/6, no rubs or gallop GI: soft, NT, ND, BS+ Musculoskeletal: warm, no edema, no thigh or calf tenderness     Data Review:    CBC  Recent Labs Lab 05/07/16 1137 05/08/16 0748  WBC 7.5 8.1  HGB 9.2* 8.9*  HCT 30.0* 28.6*  PLT 167 124*  MCV 75.9* 75.7*  MCH 23.3* 23.5*  MCHC 30.7 31.1  RDW 16.9* 17.5*  LYMPHSABS 0.9  --   MONOABS 0.8  --   EOSABS 0.1  --     BASOSABS 0.0  --     Chemistries   Recent Labs Lab 05/07/16 1137 05/08/16 0748  NA 132* 133*  K 4.1 3.9  CL 97* 100*  CO2 26 25  GLUCOSE 316* 196*  BUN 21* 19  CREATININE 0.94 0.80  CALCIUM 9.7 9.0  MG  --  1.8  AST 510* 408*  ALT 196* 191*  ALKPHOS 68 53  BILITOT 0.6 0.8   ------------------------------------------------------------------------------------------------------------------ No results for input(s): CHOL, HDL, LDLCALC, TRIG, CHOLHDL, LDLDIRECT in the last 72 hours.  Lab Results  Component Value Date   HGBA1C 8.0 (H) 04/15/2016   ------------------------------------------------------------------------------------------------------------------ No results for input(s): TSH, T4TOTAL, T3FREE, THYROIDAB in the last 72 hours.  Invalid input(s): FREET3 ------------------------------------------------------------------------------------------------------------------  Recent Labs  05/07/16 1422  VITAMINB12 416  FERRITIN 13  TIBC 427  IRON 29  RETICCTPCT 0.9    Coagulation profile No results for input(s): INR, PROTIME in the last 168 hours.  No results for input(s): DDIMER in the last 72 hours.  Cardiac Enzymes No results for input(s): CKMB, TROPONINI, MYOGLOBIN in the last 168 hours.  Invalid input(s): CK ------------------------------------------------------------------------------------------------------------------    Component Value Date/Time   BNP 307.8 (H) 03/06/2016 1404    Inpatient Medications  Scheduled Meds: . aspirin  325 mg Oral Daily  . calcium carbonate  1,250 mg Oral BID WC  . heparin  5,000 Units Subcutaneous Q8H  . insulin aspart  0-9 Units Subcutaneous TID WC  . insulin detemir  45 Units Subcutaneous QHS  . metoprolol tartrate  25 mg Oral BID  . omega-3 acid ethyl esters  1 g Oral Daily  . pantoprazole  40 mg Oral Daily  . potassium chloride SA  20 mEq Oral QHS   Continuous Infusions: . sodium chloride 100 mL/hr at  05/08/16 0944   PRN Meds:.acetaminophen, ondansetron (ZOFRAN) IV, polyvinyl alcohol  Micro Results No results found for this or any previous visit (from the past 240 hour(s)).  Radiology Reports No results found.  Time Spent in minutes  25   Louellen Molder M.D on 05/08/2016 at 10:42 AM  Between 7am to 7pm - Pager - [phone]  After 7pm go to [email or web address] - password [facility]  [facility] -  Office  [phone]

## 2016-05-09 DIAGNOSIS — K7460 Unspecified cirrhosis of liver: Secondary | ICD-10-CM

## 2016-05-09 DIAGNOSIS — D509 Iron deficiency anemia, unspecified: Secondary | ICD-10-CM | POA: Diagnosis present

## 2016-05-09 DIAGNOSIS — D508 Other iron deficiency anemias: Secondary | ICD-10-CM

## 2016-05-09 DIAGNOSIS — Z951 Presence of aortocoronary bypass graft: Secondary | ICD-10-CM

## 2016-05-09 LAB — GLUCOSE, CAPILLARY
Glucose-Capillary: 59 mg/dL — ABNORMAL LOW (ref 65–99)
Glucose-Capillary: 93 mg/dL (ref 65–99)

## 2016-05-09 LAB — CK: Total CK: 4927 U/L — ABNORMAL HIGH (ref 38–234)

## 2016-05-09 MED ORDER — FERROUS SULFATE 325 (65 FE) MG PO TABS: 325.0000 mg | ORAL_TABLET | Freq: Two times a day (BID) | ORAL | 0 refills | Status: DC

## 2016-05-09 NOTE — Discharge Summary (Signed)
Physician Discharge Summary  [name] PXT:[id number] DOB: May 14, 1945 DOA: 05/07/2016  PCP: Jeanmarie Hubert, MD  Admit date: 05/07/2016 Discharge date: 05/09/2016  Admitted From: Home Disposition:  Home  Recommendations for Outpatient Follow-up:  1. Follow up with PCP in 1 week. Recheck renal function and CPK during follow-up. 2. Patient is taken off statins due to rhabdomyolysis.  Home Health: None Equipment/Devices: None  Discharge Condition: Fair CODE STATUS: Full code Diet recommendation: Heart Healthy    Discharge Diagnoses:  Principal Problem:   Statin-induced rhabdomyolysis   Active Problems:   Hyperlipemia   GERD (gastroesophageal reflux disease)   Essential hypertension   DM (diabetes mellitus), type 2, uncontrolled ([facility])   S/P CABG x 3   CAD (coronary artery disease)   Cirrhosis of liver with ascites ([facility])   Iron deficiency anemia  Brief narrative/history of present illness 71 year old female with history of CAD with CABG in September 2017 after presenting with non-Q-wave MI, diastolic CHF, cirrhosis with decompensated liver disease (suspected to be NASH, negative serologies and hepatitis panel with GI planning on EGD in future) who presents with severe muscle pain and weakness for almost 3-4 week duration. Patient found to have significant rhabdomyolysis and observed on hospitalist service.  Hospital course  Principal Problem:   Statin-induced rhabdomyolysis CPK of 10,000. Discontinued statin and patient should not be on it. Placed on IV hydration. CPK improved by >50% today ( about 5000). Renal function remained stable. -Muscle pain and weakness has significantly improved. -Patient can be discharged home and have her CPK and renal function checked during outpatient follow-up in one week.   Active Problems: CAD with history of CABG in September 2017 On aspirin and metoprolol. Discontinued statin.  Cirrhosis with ascites Suspected due to NASH. CT  abdomen showing gastroesophageal and perigastric varices. Serological workup done as outpatient were unremarkable. Follows with Dr. [name] with plan on EGD as outpatient. Lasix and Aldactone were held given soft blood pressure and rhabdomyolysis. Has mild ascites on exam today. Will resume both medications upon discharge.     DM (diabetes mellitus), type 2, uncontrolled ([place]) Continue home dose Lantus . A1c of 8.  Microcytic anemia Secondary to iron deficiency. Check stool for Hemoccult. Colonoscopy in 2012 showed tubular adenoma. Prescribed iron supplement upon discharge. Has outpatient follow-up with Dr. [name] in February with plan for EGD. The patient is on meloxicam for arthritis and bursitis. I have instructed her to stay away from it and other NSAIDs as much possible.  GERD Continue PPI  Hypokalemia Replenished  Generalized weakness Suspected due to rhabdomyolysis.. Much improved.   Code Status : Full code  Family Communication  : Husband at bedside  Disposition Plan  : Home   Consults  : None  Procedures  : None  Discharge Instructions   Allergies as of 05/09/2016      Reactions   Kiwi Extract Anaphylaxis   Tdap [diphth-acell Pertussis-tetanus] Swelling, Other (See Comments)   Swelling at injection site, gets very hot   Statins    RHABDOMYOLYSIS   Latex Itching, Dermatitis, Rash   Tramadol Nausea And Vomiting      Medication List    TAKE these medications   acetaminophen 325 MG tablet Commonly known as:  TYLENOL Take 2 tablets (650 mg total) by mouth every 6 (six) hours as needed for mild pain. What changed:  how much to take   aspirin 325 MG EC tablet Take 1 tablet (325 mg total) by mouth daily.   Biotin 10000 MCG  Tabs Take 10,000 mcg by mouth every morning.   calcium carbonate 600 MG Tabs tablet Commonly known as:  OS-CAL Take 600 mg by mouth 2 (two) times daily with a meal.   cholecalciferol 1000 units tablet Commonly known as:   VITAMIN D Take 1,000 Units by mouth daily.   ciprofloxacin 250 MG tablet Commonly known as:  CIPRO Take 250 mg by mouth 2 (two) times daily as needed (to prevent UTIs). Take one tablet after intercourse to prevent urinary tract infection   dextromethorphan-guaiFENesin 30-600 MG 12hr tablet Commonly known as:  MUCINEX DM Take 1 tablet by mouth 2 (two) times daily as needed for cough.   ferrous sulfate 325 (65 FE) MG tablet Take 1 tablet (325 mg total) by mouth 2 (two) times daily with a meal.   fish oil-omega-3 fatty acids 1000 MG capsule Take 1 g by mouth daily.   fluticasone 50 MCG/ACT nasal spray Commonly known as:  FLONASE Place 2 sprays into both nostrils daily as needed for allergies or rhinitis.   furosemide 40 MG tablet Commonly known as:  LASIX Take 40 mg by mouth 2 (two) times daily.   Glucosamine 500 MG Caps Take 2,000 mg by mouth 2 (two) times daily.   insulin detemir 100 UNIT/ML injection Commonly known as:  LEVEMIR Inject 0.45 mLs (45 Units total) into the skin at bedtime.   MAGNESIUM PO Take 500 mg by mouth 2 (two) times daily in the am and at bedtime..   meloxicam 15 MG tablet Commonly known as:  MOBIC TAKE 1 TABLET BY MOUTH DAILY TO HELP ARTHRITIS AND BURSITIS What changed:  See the new instructions.   metFORMIN 1000 MG tablet Commonly known as:  GLUCOPHAGE Take one tablet by mouth twice daily to treat diabetes What changed:  how much to take  how to take this  when to take this  additional instructions   Metoprolol Tartrate 37.5 MG Tabs Take 1 tablet by mouth 2 (two) times daily. What changed:  how much to take   multivitamin with minerals tablet Take 1 tablet by mouth daily.   omeprazole 20 MG capsule Commonly known as:  PRILOSEC TAKE ONE CAPSULE BY MOUTH DAILY What changed:  See the new instructions.   potassium chloride SA 20 MEQ tablet Commonly known as:  K-DUR,KLOR-CON Take 20 mEq by mouth at bedtime.   PROBIOTIC DAILY  PO Take 1 capsule by mouth daily. Digestive Advantage Probiotic   spironolactone 100 MG tablet Commonly known as:  ALDACTONE Take 1 tablet (100 mg total) by mouth 2 (two) times daily.   SYSTANE OP Place 1 drop into both eyes 2 (two) times daily.   VICKS VAPOR IN Inhale 1 application into the lungs at bedtime.   VITAMIN B 12 PO Take 1,000 mcg by mouth daily.      Follow-up Information    Jeanmarie Hubert, MD. Schedule an appointment as soon as possible for a visit in 1 week(s).   Specialty:  Internal Medicine Contact information: [place] [address] [phone]          Allergies  Allergen Reactions  . Kiwi Extract Anaphylaxis  . Tdap [Diphth-Acell Pertussis-Tetanus] Swelling and Other (See Comments)    Swelling at injection site, gets very hot  . Statins     RHABDOMYOLYSIS  . Latex Itching, Dermatitis and Rash  . Tramadol Nausea And Vomiting        Subjective: Feels much better today. Weakness  significantly improved. Denies pain in  her thighs.  Discharge Exam: Vitals:   05/08/16 2126 05/09/16 0536  BP: (!) 115/49 (!) 90/46  Pulse: 81 69  Resp: 18 18  Temp: 98.9 F (37.2 C) 98.3 F (36.8 C)   Vitals:   05/08/16 0958 05/08/16 1444 05/08/16 2126 05/09/16 0536  BP: (!) 101/45 (!) 114/53 (!) 115/49 (!) 90/46  Pulse: 75 80 81 69  Resp:   18 18  Temp:  98.4 F (36.9 C) 98.9 F (37.2 C) 98.3 F (36.8 C)  TempSrc:  Oral Oral Oral  SpO2:  94% 96% 95%  Weight:    64.8 kg (142 lb 13.7 oz)  Height:        Gen: not in distress HEENT: Pallor present, moist mucosa, supple neck Chest: clear b/l, no added sounds CVS: N B3&Z3, systolic murmur 3/6, no rubs or gallop GI: soft, NT, ND, BS+, mild ascites Musculoskeletal: warm, no edema, no thigh or calf tenderness     The results of significant diagnostics from this hospitalization (including imaging, microbiology, ancillary and laboratory) are listed below for reference.      Microbiology: No results found for this or any previous visit (from the past 240 hour(s)).   Labs: BNP (last 3 results)  Recent Labs  03/06/16 1404  BNP 299.2*   Basic Metabolic Panel:  Recent Labs Lab 05/07/16 1137 05/08/16 0748  NA 132* 133*  K 4.1 3.9  CL 97* 100*  CO2 26 25  GLUCOSE 316* 196*  BUN 21* 19  CREATININE 0.94 0.80  CALCIUM 9.7 9.0  MG  --  1.8  PHOS  --  2.7   Liver Function Tests:  Recent Labs Lab 05/07/16 1137 05/08/16 0748  AST 510* 408*  ALT 196* 191*  ALKPHOS 68 53  BILITOT 0.6 0.8  PROT 6.9 6.5  ALBUMIN 2.8* 2.5*   No results for input(s): LIPASE, AMYLASE in the last 168 hours. No results for input(s): AMMONIA in the last 168 hours. CBC:  Recent Labs Lab 05/07/16 1137 05/08/16 0748  WBC 7.5 8.1  NEUTROABS 5.6  --   HGB 9.2* 8.9*  HCT 30.0* 28.6*  MCV 75.9* 75.7*  PLT 167 124*   Cardiac Enzymes:  Recent Labs Lab 05/06/16 1118 05/07/16 2018 05/08/16 0748 05/09/16 0400  CKTOTAL 8,961* 10,361* 7,386* 4,927*   BNP: Invalid input(s): POCBNP CBG:  Recent Labs Lab 05/08/16 1229 05/08/16 1654 05/08/16 2125 05/09/16 0740 05/09/16 0812  GLUCAP 196* 136* 231* 59* 93   D-Dimer No results for input(s): DDIMER in the last 72 hours. Hgb A1c No results for input(s): HGBA1C in the last 72 hours. Lipid Profile No results for input(s): CHOL, HDL, LDLCALC, TRIG, CHOLHDL, LDLDIRECT in the last 72 hours. Thyroid function studies No results for input(s): TSH, T4TOTAL, T3FREE, THYROIDAB in the last 72 hours.  Invalid input(s): FREET3 Anemia work up  Recent Labs  05/07/16 1422  VITAMINB12 416  FERRITIN 13  TIBC 427  IRON 29  RETICCTPCT 0.9   Urinalysis    Component Value Date/Time   COLORURINE YELLOW 01/27/2016 2016   APPEARANCEUR CLEAR 01/27/2016 2016   LABSPEC 1.013 01/27/2016 2016   PHURINE 7.0 01/27/2016 2016   GLUCOSEU NEGATIVE 01/27/2016 2016   HGBUR MODERATE (A) 01/27/2016 2016   BILIRUBINUR NEGATIVE  01/27/2016 2016   BILIRUBINUR neg 05/05/2012 1445   KETONESUR NEGATIVE 01/27/2016 2016   PROTEINUR NEGATIVE 01/27/2016 2016   UROBILINOGEN 0.2 05/05/2012 1445   UROBILINOGEN 0.2 12/18/2011 1409   NITRITE NEGATIVE 01/27/2016 2016   LEUKOCYTESUR  NEGATIVE 01/27/2016 2016   Sepsis Labs Invalid input(s): PROCALCITONIN,  WBC,  LACTICIDVEN Microbiology No results found for this or any previous visit (from the past 240 hour(s)).   Time coordinating discharge: <30 minutes  SIGNED:   Louellen Molder, MD  [facility] 05/09/2016, 8:38 AM Pager   If 7PM-7AM, please contact night-coverage [email or web address] Password [email or web address]

## 2016-05-09 NOTE — Discharge Instructions (Signed)
Rhabdomyolysis °Rhabdomyolysis is a condition that happens when muscle cells break down and release substances into the blood that can damage the kidneys. This condition happens because of damage to the muscles that move bones (skeletal muscle). When the skeletal muscles are damaged, substances inside the muscle cells go into the blood. One of these substances is a protein called myoglobin. °Large amounts of myoglobin can cause kidney damage or kidney failure. Other substances that are released by muscle cells may upset the balance of the minerals (electrolytes) in your blood. This imbalance causes your blood to have too much acid (acidosis). °What are the causes? °This condition is caused by muscle damage. Muscle damage often happens because of: °· Using your muscles too much. °· An injury that crushes or squeezes a muscle too tightly. °· Using illegal drugs, mainly cocaine. °· Alcohol abuse. °Other possible causes include: °· Prescription medicines, such as those that: °¨ Lower cholesterol (statins). °¨ Treat ADHD (attention deficit hyperactivity disorder) or help with weight loss (amphetamines). °¨ Treat pain (opiates). °· Infections. °· Muscle diseases that are passed down from parent to child (inherited). °· High fever. °· Heatstroke. °· Not having enough fluids in your body (dehydration). °· Seizures. °· Surgery. °What increases the risk? °This condition is more likely to develop in people who: °· Have a family history of muscle disease. °· Take part in extreme sports, such as running in marathons. °· Have diabetes. °· Are older. °· Abuse drugs or alcohol. °What are the signs or symptoms? °Symptoms of this condition vary. Some people have very few symptoms, and other people have many symptoms. The most common symptoms include: °· Muscle pain and swelling. °· Weak muscles. °· Dark urine. °· Feeling weak and tired. °Other symptoms include: °· Nausea and vomiting. °· Fever. °· Pain in the abdomen. °· Pain in the  joints. °Symptoms of complications from this condition include: °· Heart rhythm that is not normal (arrhythmia). °· Seizures. °· Not urinating enough because of kidney failure. °· Very low blood pressure (shock). Signs of shock include dizziness, blurry vision, and clammy skin. °· Bleeding that is hard to stop or control. °How is this diagnosed? °This condition is diagnosed based on your medical history, your symptoms, and a physical exam. Tests may also be done, including: °· Blood tests. °· Urine tests to check for myoglobin. °You may also have other tests to check for causes of muscle damage and to check for complications. °How is this treated? °Treatment for this condition helps to: °· Make sure you have enough fluids in your body. °· Lower the acid levels in your blood to reverse acidosis. °· Protect your kidneys. °Treatment may include: °· Fluids and medicines given through an IV tube that is inserted into one of your veins. °· Medicines to lower acidosis or to bring back the balance of the minerals in your body. °· Hemodialysis. This treatment uses an artificial kidney machine to filter your blood while you recover. You may have this if other treatments are not helping. °Follow these instructions at home: °· Take over-the-counter and prescription medicines only as told by your health care provider. °· Rest at home until your health care provider says that you can return to your normal activities. °· Drink enough fluid to keep your urine clear or pale yellow. °· Do not do activities that take a lot of effort (are strenuous). Ask your health care provider what level of exercise is safe for you. °· Do not abuse drugs or alcohol.   If you are having problems with drug or alcohol use, ask your health care provider for help. °· Keep all follow-up visits as told by your health care provider. This is important. °Contact a health care provider if: °· You start having symptoms of this condition after treatment. °Get help  right away if: °· You have a seizure. °· You bleed easily or cannot control bleeding. °· You cannot urinate. °· You have chest pain. °· You have trouble breathing. °This information is not intended to replace advice given to you by your health care provider. Make sure you discuss any questions you have with your health care provider. °Document Released: 04/07/2004 Document Revised: 02/05/2016 Document Reviewed: 02/05/2016 °Elsevier Interactive Patient Education © 2017 [employer] ° °

## 2016-05-09 NOTE — Progress Notes (Signed)
[name] to be D/C'd to home per MD order.  Discussed with the patient and all questions fully answered.  VSS, Skin clean, dry and intact without evidence of skin break down, no evidence of skin tears noted. IV catheter discontinued intact. Site without signs and symptoms of complications. Dressing and pressure applied.  An After Visit Summary was printed and given to the patient. Patient received prescription.  D/c education completed with patient/family including follow up instructions, medication list, d/c activities limitations if indicated, with other d/c instructions as indicated by MD - patient able to verbalize understanding, all questions fully answered.   Patient instructed to return to ED, call 911, or call MD for any changes in condition.   Patient escorted via [place], and D/C home via private auto.  [name] Price 05/09/2016 11:32 AM

## 2016-05-10 ENCOUNTER — Other Ambulatory Visit: Payer: Self-pay | Admitting: Internal Medicine

## 2016-05-10 LAB — FOLATE RBC
Folate, Hemolysate: 612.9 ng/mL
Folate, RBC: 1977 ng/mL (ref >498)
Hematocrit: 31 % — ABNORMAL LOW (ref 34.0–46.6)

## 2016-05-11 ENCOUNTER — Ambulatory Visit (HOSPITAL_COMMUNITY): Payer: Medicare Other

## 2016-05-11 ENCOUNTER — Other Ambulatory Visit: Payer: Self-pay | Admitting: Internal Medicine

## 2016-05-11 ENCOUNTER — Ambulatory Visit: Payer: Medicare Other | Admitting: Cardiovascular Disease

## 2016-05-13 ENCOUNTER — Emergency Department (HOSPITAL_COMMUNITY)
Admission: EM | Admit: 2016-05-13 | Discharge: 2016-05-13 | Disposition: A | Discharge status: Home / Self Care | Payer: Medicare Other | Attending: Emergency Medicine | Admitting: Emergency Medicine

## 2016-05-13 ENCOUNTER — Encounter (HOSPITAL_COMMUNITY): Payer: Self-pay | Admitting: Emergency Medicine

## 2016-05-13 ENCOUNTER — Other Ambulatory Visit: Payer: Self-pay | Admitting: *Deleted

## 2016-05-13 ENCOUNTER — Ambulatory Visit (HOSPITAL_COMMUNITY): Payer: Medicare Other

## 2016-05-13 DIAGNOSIS — E1122 Type 2 diabetes mellitus with diabetic chronic kidney disease: Secondary | ICD-10-CM | POA: Insufficient documentation

## 2016-05-13 DIAGNOSIS — I5030 Unspecified diastolic (congestive) heart failure: Secondary | ICD-10-CM | POA: Diagnosis not present

## 2016-05-13 DIAGNOSIS — I2510 Atherosclerotic heart disease of native coronary artery without angina pectoris: Secondary | ICD-10-CM | POA: Insufficient documentation

## 2016-05-13 DIAGNOSIS — Z794 Long term (current) use of insulin: Secondary | ICD-10-CM | POA: Insufficient documentation

## 2016-05-13 DIAGNOSIS — Z8603 Personal history of neoplasm of uncertain behavior: Secondary | ICD-10-CM | POA: Insufficient documentation

## 2016-05-13 DIAGNOSIS — Z7982 Long term (current) use of aspirin: Secondary | ICD-10-CM | POA: Diagnosis not present

## 2016-05-13 DIAGNOSIS — Z0000 Encounter for general adult medical examination without abnormal findings: Secondary | ICD-10-CM | POA: Diagnosis not present

## 2016-05-13 DIAGNOSIS — I130 Hypertensive heart and chronic kidney disease with heart failure and stage 1 through stage 4 chronic kidney disease, or unspecified chronic kidney disease: Secondary | ICD-10-CM | POA: Insufficient documentation

## 2016-05-13 DIAGNOSIS — N181 Chronic kidney disease, stage 1: Secondary | ICD-10-CM | POA: Diagnosis not present

## 2016-05-13 DIAGNOSIS — Z79899 Other long term (current) drug therapy: Secondary | ICD-10-CM | POA: Insufficient documentation

## 2016-05-13 DIAGNOSIS — Z951 Presence of aortocoronary bypass graft: Secondary | ICD-10-CM | POA: Diagnosis not present

## 2016-05-13 DIAGNOSIS — Z91040 Latex allergy status: Secondary | ICD-10-CM | POA: Insufficient documentation

## 2016-05-13 DIAGNOSIS — Z0189 Encounter for other specified special examinations: Secondary | ICD-10-CM

## 2016-05-13 LAB — COMPREHENSIVE METABOLIC PANEL
ALBUMIN: 2.4 g/dL — AB (ref 3.5–5.0)
ALT: 126 U/L — AB (ref 14–54)
AST: 125 U/L — AB (ref 15–41)
Alkaline Phosphatase: 83 U/L (ref 38–126)
Anion gap: 13 (ref 5–15)
BILIRUBIN TOTAL: 0.7 mg/dL (ref 0.3–1.2)
BUN: 19 mg/dL (ref 6–20)
CHLORIDE: 100 mmol/L — AB (ref 101–111)
CO2: 20 mmol/L — ABNORMAL LOW (ref 22–32)
CREATININE: 0.83 mg/dL (ref 0.44–1.00)
Calcium: 9.3 mg/dL (ref 8.9–10.3)
GFR calc Af Amer: >60 mL/min (ref >60)
GLUCOSE: 294 mg/dL — AB (ref 65–99)
Potassium: 4.3 mmol/L (ref 3.5–5.1)
Sodium: 133 mmol/L — ABNORMAL LOW (ref 135–145)
TOTAL PROTEIN: 5.9 g/dL — AB (ref 6.5–8.1)

## 2016-05-13 LAB — CBC
HEMATOCRIT: 28.2 % — AB (ref 36.0–46.0)
HEMOGLOBIN: 8.7 g/dL — AB (ref 12.0–15.0)
MCH: 23.3 pg — ABNORMAL LOW (ref 26.0–34.0)
MCHC: 30.9 g/dL (ref 30.0–36.0)
MCV: 75.4 fL — AB (ref 78.0–100.0)
Platelets: 141 10*3/uL — ABNORMAL LOW (ref 150–400)
RBC: 3.74 MIL/uL — AB (ref 3.87–5.11)
RDW: 18.1 % — ABNORMAL HIGH (ref 11.5–15.5)
WBC: 5.2 10*3/uL (ref 4.0–10.5)

## 2016-05-13 LAB — CK TOTAL AND CKMB (NOT AT ARMC)
CK TOTAL: 340 U/L — AB (ref 38–234)
CK, MB: 3.2 ng/mL (ref 0.5–5.0)
Relative Index: 0.9 (ref 0.0–2.5)

## 2016-05-13 MED ORDER — METOPROLOL TARTRATE 37.5 MG PO TABS: 1.0000 | ORAL_TABLET | Freq: Two times a day (BID) | ORAL | 1 refills | Status: DC

## 2016-05-13 NOTE — ED Notes (Signed)
Changed b/p cuff to small

## 2016-05-13 NOTE — Telephone Encounter (Signed)
[place]

## 2016-05-13 NOTE — Discharge Instructions (Signed)
Continue to take your medications as prescribed.  Please follow-up with your primary care physician in a timely manner for repeat laboratory evaluation. Additionally, at this appointment please discuss with him/her the necessity to starting aggressive outpatient physical therapy and rehabilitation services.

## 2016-05-13 NOTE — ED Provider Notes (Signed)
[facility] DEPT Provider Note   CSN: [id number] Arrival date & time: 05/13/16  0910     History   Chief Complaint Chief Complaint  Patient presents with  . Labs Only    HPI [name] is a 71 y.o. female with a medical history of coronary artery disease status post coronary artery bypass graft in September 4656, diastolic congestive heart failure, cirrhosis with decompensated liver disease who presents for lab work following a recent hospital admission for lab work. Importantly, on 05/07/2016 the patient was admitted to the [facility] and found to be in rhabdomyolysis with a creatinine kinase of greater than 10,000. The etiology of this was thought to be secondary to statin induced myopathy and rhabdomyolysis. The patient was previously on statin therapy for secondary prevention of recurrent cardiovascular disease in accordance with evidence based guidelines. When she was admitted to the hospital the patient's statin was discontinued and she was given aggressive IV hydration. During this admission her creatinine and BUN remained within normal limits and she did not develop an acute kidney injury. Her creatinine kinase improved appropriately following aggressive IV hydration. At the time of discharge her creatinine kinase was approximately 5000. She was instructed to return to her primary care physician's office in approximately 1 week for repeat laboratory evaluation. However, the patient has chosen to present to the emergency department today to receive this lab work.  In the emergency department the patient was afebrile and hemodynamically stable. On review of systems the patient denied chest pain or shortness of breath. She does endorse mild nausea for approximately one week but this is unchanged from her prior hospitalization. She denies emesis or abdominal pain. She denies night sweats, weight loss or increased fatigue. She does continue to have some muscle weakness  following her statin-induced rhabdomyolysis but this has also improved over the previous week. She has no additional acute complaints or concerns at today's visit.  HPI  Past Medical History:  Diagnosis Date  . Allergy   . Arthritis    neck  . Cataract    bilateral - MD monitoring cataracts  . CHF (congestive heart failure) ([employer])   . Chronic kidney disease, stage I    DR OTTELIN  HX UTIS  . Cirrhosis ([place])   . Cramp of limb   . Diabetes mellitus   . Dysphagia, unspecified(787.20)   . Dysuria   . Epistaxis   . GERD (gastroesophageal reflux disease)   . Heart murmur    NO CARDIOLOGIST  DX FOR YEARS ASYMPTOMATIC  . Lumbago   . Neoplasm of uncertain behavior of skin   . Nonspecific elevation of levels of transaminase or lactic acid dehydrogenase (LDH)   . Osteoarthrosis, unspecified whether generalized or localized, unspecified site   . Other and unspecified hyperlipidemia    diet controlled  . Pain in joint, shoulder region   . Paresthesias 04/01/2015  . Postablative ovarian failure   . Trochanteric bursitis of left hip 12/15/2015  . Type 2 diabetes mellitus without complication ([place])   . Unspecified essential hypertension    no meds    Patient Active Problem List   Diagnosis Date Noted  . Routine lab draw 05/13/2016  . Iron deficiency anemia 05/09/2016  . Statin-induced rhabdomyolysis 05/07/2016  . Cirrhosis of liver with ascites ([place]) 04/05/2016  . PVC's (premature ventricular contractions) 04/05/2016  . Ascites 03/01/2016  . CAD (coronary artery disease) 02/16/2016  . S/P CABG x 3 02/02/2016  . History of non-ST elevation myocardial  infarction (NSTEMI) 01/27/2016  . DM (diabetes mellitus), type 2, uncontrolled ([place]) 12/15/2015  . Trochanteric bursitis of left hip 12/15/2015  . S/P lumbar spinal fusion 06/10/2015  . Paresthesia 04/01/2015  . Osteoporosis 03/31/2015  . Hearing loss 06/25/2014  . Insomnia 06/25/2014  . Hair thinning 06/25/2014  . Muscle spasm  10/08/2013  . Biceps tendon rupture, proximal 11/24/2012  . Hyperlipemia   . GERD (gastroesophageal reflux disease)   . Diabetes mellitus type 2, controlled ([place])   . Essential hypertension     Past Surgical History:  Procedure Laterality Date  . CARDIAC CATHETERIZATION N/A 01/27/2016   Procedure: Left Heart Cath and Coronary Angiography;  Surgeon: Belva Crome, MD;  Location: [facility];  Service: Cardiovascular;  Laterality: N/A;  . COLONOSCOPY  2012   [facility] - polyp  . CORONARY ARTERY BYPASS GRAFT N/A 01/28/2016   Procedure: CORONARY ARTERY BYPASS GRAFTING (CABG) x 3 USING RIGHT LEG GREATER SAPHENOUS VEIN GRAFT;  Surgeon: Melrose Nakayama, MD;  Location: [facility];  Service: Open Heart Surgery;  Laterality: N/A;  . ENDOVEIN HARVEST OF GREATER SAPHENOUS VEIN Right 01/28/2016   Procedure: ENDOVEIN HARVEST OF GREATER SAPHENOUS VEIN;  Surgeon: Melrose Nakayama, MD;  Location: [facility];  Service: Open Heart Surgery;  Laterality: Right;  . MAXIMUM ACCESS (MAS)POSTERIOR LUMBAR INTERBODY FUSION (PLIF) 1 LEVEL Left 06/10/2015   Procedure: FOR MAXIMUM ACCESS (MAS) POSTERIOR LUMBAR INTERBODY FUSION (PLIF) LUMBAR THREE-FOUR EXTRAFORAMINAL MICRODISCECTOMY LUMBAR FIVE-SACRAL ONE LEFT;  Surgeon: Eustace Moore, MD;  Location: [facility];  Service: Neurosurgery;  Laterality: Left;  . TEE WITHOUT CARDIOVERSION N/A 01/28/2016   Procedure: TRANSESOPHAGEAL ECHOCARDIOGRAM (TEE);  Surgeon: Melrose Nakayama, MD;  Location: [facility];  Service: Open Heart Surgery;  Laterality: N/A;  . TUBAL LIGATION  1982   Dr Connye Burkitt  . UPPER GASTROINTESTINAL ENDOSCOPY    . VAGINAL HYSTERECTOMY  1997   Dr Rande Lawman    OB History    No data available       Home Medications    Prior to Admission medications   Medication Sig Start Date End Date Taking? Authorizing Provider  acetaminophen (TYLENOL) 325 MG tablet Take 2 tablets (650 mg total) by mouth every 6 (six) hours as needed for mild pain. Patient taking  differently: Take 325 mg by mouth every 6 (six) hours as needed for mild pain.  02/03/16   Erin R Barrett, PA-C  Aromatic Inhalants (VICKS VAPOR IN) Inhale 1 application into the lungs at bedtime.    Historical Provider, MD  aspirin EC 325 MG EC tablet Take 1 tablet (325 mg total) by mouth daily. 02/03/16   Erin R Barrett, PA-C  Biotin 10000 MCG TABS Take 10,000 mcg by mouth every morning.    Historical Provider, MD  calcium carbonate (OS-CAL) 600 MG TABS Take 600 mg by mouth 2 (two) times daily with a meal.      Historical Provider, MD  cholecalciferol (VITAMIN D) 1000 UNITS tablet Take 1,000 Units by mouth daily.      Historical Provider, MD  ciprofloxacin (CIPRO) 250 MG tablet Take 250 mg by mouth 2 (two) times daily as needed (to prevent UTIs). Take one tablet after intercourse to prevent urinary tract infection    Historical Provider, MD  Cyanocobalamin (VITAMIN B 12 PO) Take 1,000 mcg by mouth daily.      Historical Provider, MD  dextromethorphan-guaiFENesin (MUCINEX DM) 30-600 MG 12hr tablet Take 1 tablet by mouth 2 (two) times daily as needed for  cough. 02/03/16   Erin R Barrett, PA-C  ferrous sulfate 325 (65 FE) MG tablet Take 1 tablet (325 mg total) by mouth 2 (two) times daily with a meal. 05/09/16   Nishant Dhungel, MD  fish oil-omega-3 fatty acids 1000 MG capsule Take 1 g by mouth daily.      Historical Provider, MD  fluticasone (FLONASE) 50 MCG/ACT nasal spray Place 2 sprays into both nostrils daily as needed for allergies or rhinitis.    Historical Provider, MD  furosemide (LASIX) 40 MG tablet Take 40 mg by mouth 2 (two) times daily.    Historical Provider, MD  Glucosamine 500 MG CAPS Take 2,000 mg by mouth 2 (two) times daily.    Historical Provider, MD  insulin detemir (LEVEMIR) 100 UNIT/ML injection Inject 0.45 mLs (45 Units total) into the skin at bedtime. 04/20/16   Estill Dooms, MD  MAGNESIUM PO Take 500 mg by mouth 2 (two) times daily in the am and at bedtime.[name] Kitchen    Historical  Provider, MD  meloxicam (MOBIC) 15 MG tablet TAKE 1 TABLET BY MOUTH DAILY TO HELP ARTHRITIS AND BURSITIS 05/12/16   Estill Dooms, MD  metFORMIN (GLUCOPHAGE) 1000 MG tablet TAKE 1 TABLET BY MOUTH TWICE DAILY TO TREAT DIABETES 05/10/16   Estill Dooms, MD  Metoprolol Tartrate 37.5 MG TABS Take 1 tablet by mouth 2 (two) times daily. 05/13/16   Estill Dooms, MD  Multiple Vitamins-Minerals (MULTIVITAMIN WITH MINERALS) tablet Take 1 tablet by mouth daily.      Historical Provider, MD  omeprazole (PRILOSEC) 20 MG capsule TAKE ONE CAPSULE BY MOUTH DAILY Patient taking differently: TAKE 20 MG BY MOUTH DAILY 03/18/16   Estill Dooms, MD  Polyethyl Glycol-Propyl Glycol (SYSTANE OP) Place 1 drop into both eyes 2 (two) times daily.    Historical Provider, MD  potassium chloride SA (K-DUR,KLOR-CON) 20 MEQ tablet Take 20 mEq by mouth at bedtime.     Historical Provider, MD  Probiotic Product (PROBIOTIC DAILY PO) Take 1 capsule by mouth daily. Digestive Advantage Probiotic    Historical Provider, MD  spironolactone (ALDACTONE) 100 MG tablet Take 1 tablet (100 mg total) by mouth 2 (two) times daily. 03/09/16 05/07/16  [name], MD    Family History Family History  Problem Relation Age of Onset  . Lung cancer Father   . Arthritis Sister   . Arthritis Brother   . Heart disease Maternal Grandmother   . Heart disease Maternal Grandfather   . Heart disease Paternal Grandmother   . Heart disease Paternal Grandfather   . Breast cancer Mother   . Liver cancer Brother   . Colon cancer Neg Hx   . Esophageal cancer Neg Hx   . Rectal cancer Neg Hx   . Stomach cancer Neg Hx     Social History Social History  Substance Use Topics  . Smoking status: Never Smoker  . Smokeless tobacco: Never Used  . Alcohol use No     Allergies   Kiwi extract; Tdap [diphth-acell pertussis-tetanus]; Statins; Latex; and Tramadol   Review of Systems Review of Systems  All other systems reviewed and are negative. All  important review of systems are listed in the history of present illness.   Physical Exam Updated Vital Signs BP 95/60 (BP Location: Right Arm)   Pulse 62   Temp 97.8 F (36.6 C) (Oral)   Resp 16   SpO2 98%   Physical Exam  Constitutional: She is oriented to person, place,  and time. She appears well-developed and well-nourished.  Resting comfortably in bed and in no acute distress  HENT:  Head: Normocephalic and atraumatic.  Cardiovascular: Normal rate and regular rhythm.  Exam reveals no gallop and no friction rub.   No murmur heard. Pulmonary/Chest: Effort normal and breath sounds normal. No respiratory distress. She has no wheezes.  Abdominal: Soft. Bowel sounds are normal. She exhibits no distension. There is no tenderness.  Musculoskeletal: She exhibits no edema.  Patient with 5 out of 5 strength in her upper extremities bilaterally. Additionally, 5 out of 5 strength in her lower extremities bilaterally.  Neurological: She is alert and oriented to person, place, and time.  Psychiatric: She has a normal mood and affect.     ED Treatments / Results  Labs (all labs ordered are listed, but only abnormal results are displayed) Labs Reviewed  CBC - Abnormal; Notable for the following:       Result Value   RBC 3.74 (*)    Hemoglobin 8.7 (*)    HCT 28.2 (*)    MCV 75.4 (*)    MCH 23.3 (*)    RDW 18.1 (*)    Platelets 141 (*)    All other components within normal limits  COMPREHENSIVE METABOLIC PANEL - Abnormal; Notable for the following:    Sodium 133 (*)    Chloride 100 (*)    CO2 20 (*)    Glucose, Bld 294 (*)    Total Protein 5.9 (*)    Albumin 2.4 (*)    AST 125 (*)    ALT 126 (*)    All other components within normal limits  CK TOTAL AND CKMB (NOT AT [facility]) - Abnormal; Notable for the following:    Total CK 340 (*)    All other components within normal limits    EKG  EKG Interpretation None       Radiology No results found.  Procedures Procedures  (including critical care time)  Medications Ordered in ED Medications - No data to display   Initial Impression / Assessment and Plan / ED Course  I have reviewed the triage vital signs and the nursing notes.  Pertinent labs & imaging results that were available during my care of the patient were reviewed by me and considered in my medical decision making (see chart for details).  Clinical Course   The patient presented for routine lab work without other complaint. Review of systems was mostly unremarkable. For additional details please see above under history of present illness. Physical examination was without gross abnormality. Labs including CBC, CMP and CK were ordered to evaluate for improvement in the patient's statin induced rhabdomyolysis.  CBC demonstrated hemoglobin of 8.7. Her red cell distribution width is elevated to 18.1. This is most likely consistent with microcytic anemia which could be secondary to iron deficiency. Her hemoglobin has slowly been trending down over the previous several months. This has been noted on her recent hospital discharge summary paperwork. This anemia is thought to be secondary to iron deficiency. At a recent hospitalization she was prescribed iron supplementation at discharge. She has outpatient follow-up with Dr. [name] from gastroenterology in February with plan for an EGD. Basic metabolic panel was significant for hyperglycemia. Potassium was within normal limits. Creatinine was within normal limits. AST and ALT remain mildly elevated at 125 and 126 respectively. Creatinine kinase of 340. This is improved from her previous count 4 days ago at 4927. Her AST and ALT are also improved  from her previous hospitalization. Overall, I do not think the patient would benefit from IV fluids at this time she appears to be recovering appropriately following statin induced rhabdomyolysis. She will be appropriate for discharge this morning with continued outpatient  follow-up with primary care provider. She would benefit from aggressive physical therapy in the outpatient setting to regain any muscle strength lost from her hospitalization, immobilization and rhabdomyolysis.  Final Clinical Impressions(s) / ED Diagnoses   Final diagnoses:  Routine lab draw    New Prescriptions New Prescriptions   No medications on file     Ophelia Shoulder, MD 05/13/16 [place], MD 05/18/16 1239

## 2016-05-13 NOTE — ED Triage Notes (Signed)
Pt is back to get blood drawn to see if she will need Iv  Fluids , no other complaints

## 2016-05-16 ENCOUNTER — Ambulatory Visit (HOSPITAL_COMMUNITY): Payer: Medicare Other

## 2016-05-16 ENCOUNTER — Other Ambulatory Visit: Payer: Self-pay | Admitting: *Deleted

## 2016-05-16 MED ORDER — METOPROLOL TARTRATE 25 MG PO TABS: ORAL_TABLET | ORAL | 3 refills | Status: DC

## 2016-05-16 NOTE — Telephone Encounter (Signed)
Patient has been taking this medication since September after her heart surgery. Patient was to take 37.53m, but no pharmacies had 37.554mso she takes 2536mne and half tablet daily. Has an appointment with Dr. NisJohnsie Cancel February.

## 2016-05-18 ENCOUNTER — Encounter: Payer: Self-pay | Admitting: Internal Medicine

## 2016-05-18 ENCOUNTER — Ambulatory Visit (INDEPENDENT_AMBULATORY_CARE_PROVIDER_SITE_OTHER): Payer: Medicare Other | Admitting: Internal Medicine

## 2016-05-18 ENCOUNTER — Ambulatory Visit (HOSPITAL_COMMUNITY): Payer: Medicare Other

## 2016-05-18 VITALS — BP 98/58 | HR 62 | Temp 97.8°F | Ht 63.0 in | Wt 140.0 lb

## 2016-05-18 DIAGNOSIS — E1165 Type 2 diabetes mellitus with hyperglycemia: Secondary | ICD-10-CM | POA: Diagnosis not present

## 2016-05-18 DIAGNOSIS — E1159 Type 2 diabetes mellitus with other circulatory complications: Secondary | ICD-10-CM

## 2016-05-18 DIAGNOSIS — R188 Other ascites: Secondary | ICD-10-CM

## 2016-05-18 DIAGNOSIS — IMO0002 Reserved for concepts with insufficient information to code with codable children: Secondary | ICD-10-CM

## 2016-05-18 DIAGNOSIS — K7460 Unspecified cirrhosis of liver: Secondary | ICD-10-CM

## 2016-05-18 DIAGNOSIS — R252 Cramp and spasm: Secondary | ICD-10-CM | POA: Insufficient documentation

## 2016-05-18 DIAGNOSIS — I10 Essential (primary) hypertension: Secondary | ICD-10-CM

## 2016-05-18 DIAGNOSIS — T466X5A Adverse effect of antihyperlipidemic and antiarteriosclerotic drugs, initial encounter: Principal | ICD-10-CM

## 2016-05-18 DIAGNOSIS — E7800 Pure hypercholesterolemia, unspecified: Secondary | ICD-10-CM | POA: Diagnosis not present

## 2016-05-18 DIAGNOSIS — I2510 Atherosclerotic heart disease of native coronary artery without angina pectoris: Secondary | ICD-10-CM

## 2016-05-18 DIAGNOSIS — M6282 Rhabdomyolysis: Secondary | ICD-10-CM

## 2016-05-18 DIAGNOSIS — D508 Other iron deficiency anemias: Secondary | ICD-10-CM | POA: Diagnosis not present

## 2016-05-18 DIAGNOSIS — Z794 Long term (current) use of insulin: Secondary | ICD-10-CM

## 2016-05-18 NOTE — Progress Notes (Signed)
Facility  [facility]    Place of Service:   OFFICE    Allergies  Allergen Reactions  . Kiwi Extract Anaphylaxis  . Tdap [Diphth-Acell Pertussis-Tetanus] Swelling and Other (See Comments)    Swelling at injection site, gets very hot  . Statins     RHABDOMYOLYSIS  . Latex Itching, Dermatitis and Rash  . Tramadol Nausea And Vomiting    Chief Complaint  Patient presents with  . Hospitalization Follow-up    TOC 05/07/16 to 05/09/16 statin induced rhabdomyolysis (05/13/16 ED lab only). Here with husband    HPI:  Last seen by me 04/19/16.   Seen by Dr. Mariea Clonts  05/06/16 for leg pain and weakness. Suspected statin myopathy. CK 8,961. CK down to 340 on 05/13/16. Was admiitted to hospital on 05/07/16 to 05/09/16. Legs still feel numb and somewhat weak. Gets cramps in legs every night. Using packs of mustard.  Other problems include CAD, SP CABG, HTN, anemia (hgb 8.7), DM (uncontrolled at 294), and GERD.  Home glucose runniing low in the AM with values 51- 75 some mornings.  Medications: Patient's Medications  New Prescriptions   No medications on file  Previous Medications   ACETAMINOPHEN (TYLENOL) 325 MG TABLET    Take 2 tablets (650 mg total) by mouth every 6 (six) hours as needed for mild pain.   AROMATIC INHALANTS (VICKS VAPOR IN)    Vicks Vapor Rub apply small amount to outside of nose to help breathing   ASPIRIN EC 325 MG EC TABLET    Take 1 tablet (325 mg total) by mouth daily.   BIOTIN 07371 MCG TABS    Take 10,000 mcg by mouth every morning.   CALCIUM CARBONATE (OS-CAL) 600 MG TABS    Take 600 mg by mouth 2 (two) times daily with a meal.     CHOLECALCIFEROL (VITAMIN D) 1000 UNITS TABLET    Take 1,000 Units by mouth daily.     CIPROFLOXACIN (CIPRO) 250 MG TABLET    Take 250 mg by mouth 2 (two) times daily as needed (to prevent UTIs). Take one tablet after intercourse to prevent urinary tract infection   CYANOCOBALAMIN (VITAMIN B 12 PO)    Take 1,000 mcg by mouth daily.     DEXTROMETHORPHAN-GUAIFENESIN (MUCINEX DM) 30-600 MG 12HR TABLET    Take 1 tablet by mouth 2 (two) times daily as needed for cough.   FERROUS SULFATE 325 (65 FE) MG TABLET    Take 1 tablet (325 mg total) by mouth 2 (two) times daily with a meal.   FISH OIL-OMEGA-3 FATTY ACIDS 1000 MG CAPSULE    Take 1 g by mouth daily.     FLUTICASONE (FLONASE) 50 MCG/ACT NASAL SPRAY    Place 2 sprays into both nostrils daily as needed for allergies or rhinitis.   FUROSEMIDE (LASIX) 40 MG TABLET    Take 40 mg by mouth 2 (two) times daily.   GLUCOSAMINE 500 MG CAPS    Take 2,000 mg by mouth 2 (two) times daily.   INSULIN DETEMIR (LEVEMIR) 100 UNIT/ML INJECTION    Inject 0.45 mLs (45 Units total) into the skin at bedtime.   MAGNESIUM PO    Take 500 mg by mouth 2 (two) times daily in the am and at bedtime..   MELOXICAM (MOBIC) 15 MG TABLET    TAKE 1 TABLET BY MOUTH DAILY TO HELP ARTHRITIS AND BURSITIS   METFORMIN (GLUCOPHAGE) 1000 MG TABLET    TAKE 1 TABLET BY MOUTH TWICE DAILY  TO TREAT DIABETES   METOPROLOL TARTRATE (LOPRESSOR) 25 MG TABLET    Take one and half tablets by mouth twice daily for blood pressure   MULTIPLE VITAMINS-MINERALS (MULTIVITAMIN WITH MINERALS) TABLET    Take 1 tablet by mouth daily.     OMEPRAZOLE (PRILOSEC) 20 MG CAPSULE    TAKE ONE CAPSULE BY MOUTH DAILY   POLYETHYL GLYCOL-PROPYL GLYCOL (SYSTANE OP)    Place 1 drop into both eyes 2 (two) times daily.   POTASSIUM CHLORIDE SA (K-DUR,KLOR-CON) 20 MEQ TABLET    Take 20 mEq by mouth at bedtime.    PROBIOTIC PRODUCT (PROBIOTIC DAILY PO)    Take 1 capsule by mouth daily. Digestive Advantage Probiotic   SPIRONOLACTONE (ALDACTONE) 100 MG TABLET    Take 1 tablet (100 mg total) by mouth 2 (two) times daily.  Modified Medications   No medications on file  Discontinued Medications   No medications on file    Review of Systems  Constitutional: Negative.  Negative for activity change, appetite change, chills, diaphoresis, fatigue, fever and unexpected  weight change.  HENT: Positive for hearing loss and postnasal drip. Negative for congestion, ear discharge, ear pain, rhinorrhea, sore throat, tinnitus, trouble swallowing and voice change.   Eyes: Negative.  Negative for pain, redness, itching and visual disturbance.  Respiratory: Negative.  Negative for cough, choking, shortness of breath and wheezing.   Cardiovascular: Positive for chest pain. Negative for palpitations and leg swelling.       CABG x 3 01/28/16.  Gastrointestinal: Negative for abdominal distention, abdominal pain, constipation, diarrhea and nausea.       Occasional heartburn  Endocrine: Negative for cold intolerance, heat intolerance, polydipsia, polyphagia and polyuria.       Diabetic  Genitourinary: Negative for difficulty urinating, dysuria, flank pain, frequency, hematuria, pelvic pain, urgency and vaginal discharge.  Musculoskeletal: Positive for arthralgias, back pain and myalgias. Negative for gait problem, joint swelling, neck pain and neck stiffness.       Pain left greater trochanter.Statin induced rhabdomyoysis Dec 2017.  Skin: Negative for color change, pallor and rash.       Complains of thinning hair  Allergic/Immunologic: Negative.   Neurological: Negative for dizziness, tremors, seizures, syncope, weakness, numbness and headaches.       Distal paresthesias of tingling and burning in the feet and lower legs. Numb in lower legs.  Hematological: Negative.  Negative for adenopathy. Does not bruise/bleed easily.  Psychiatric/Behavioral: Positive for sleep disturbance (insomnia). Negative for agitation, behavioral problems, confusion, dysphoric mood, hallucinations and suicidal ideas. The patient is not nervous/anxious and is not hyperactive.     Vitals:   05/18/16 0948  BP: (!) 98/58  Pulse: 62  Temp: 97.8 F (36.6 C)  TempSrc: Oral  SpO2: 98%  Weight: 140 lb (63.5 kg)  Height: _0  (1.6 m)   Body mass index is 24.8 kg/m. Wt Readings from Last 3  Encounters:  05/18/16 140 lb (63.5 kg)  05/09/16 142 lb 13.7 oz (64.8 kg)  05/06/16 138 lb (62.6 kg)      Physical Exam  Constitutional: She is oriented to person, place, and time. She appears well-developed and well-nourished. No distress.  HENT:  Head: Normocephalic and atraumatic.  Right Ear: External ear normal.  Left Ear: External ear normal.  Nose: Nose normal.  Mouth/Throat: Oropharynx is clear and moist. No oropharyngeal exudate.  Bilateral hearing aids  Eyes: Conjunctivae and EOM are normal. Pupils are equal, round, and reactive to light. No scleral icterus.  Neck: Neck supple. No JVD present. No tracheal deviation present. No thyromegaly present.  Cardiovascular: Normal rate, regular rhythm, normal heart sounds and intact distal pulses.  Exam reveals no gallop and no friction rub.   No murmur heard. Pulmonary/Chest: Effort normal and breath sounds normal. No respiratory distress. She has no wheezes. She has no rales. She exhibits no tenderness.  Abdominal: Bowel sounds are normal. She exhibits no distension and no mass. There is no tenderness.  Mild abdominal distention.  Musculoskeletal: Normal range of motion. She exhibits no edema or tenderness.  Patient has evidence of  rupture long head of the biceps proximally.in the right mid upper arm. It is not tender. Previous bruising is resolved. The muscle is balled up in the mid right upper arm. Tender to percussion in the lower back. Plan left greater trochanter to palpation.  Lymphadenopathy:    She has no cervical adenopathy.  Neurological: She is alert and oriented to person, place, and time. She has normal reflexes. No cranial nerve deficit. Coordination normal.  12/112/17 MMSE 29/30. Passed clock drawing.  Skin: No rash noted. She is not diaphoretic. No erythema. No pallor.  Hair is quite thin over the scalp.  Psychiatric: She has a normal mood and affect. Her behavior is normal. Judgment and thought content normal.     Labs reviewed: Lab Summary Latest Ref Rng & Units 05/13/2016  Hemoglobin 12.0 - 15.0 g/dL 8.7(L)  Hematocrit 36.0 - 46.0 % 28.2(L)  White count 4.0 - 10.5 K/uL 5.2  Platelet count 150 - 400 K/uL 141(L)  Sodium 135 - 145 mmol/L 133(L)  Potassium 3.5 - 5.1 mmol/L 4.3  Calcium 8.9 - 10.3 mg/dL 9.3  Phosphorus - (None)  Creatinine 0.44 - 1.00 mg/dL 0.83  AST 15 - 41 U/L 125(H)  Alk Phos 38 - 126 U/L 83  Bilirubin 0.3 - 1.2 mg/dL 0.7  Glucose 65 - 99 mg/dL 294(H)  Cholesterol - (None)  HDL cholesterol - (None)  Triglycerides - (None)  LDL Direct - (None)  LDL Calc - (None)  Total protein 6.5 - 8.1 g/dL 5.9(L)  Albumin 3.5 - 5.0 g/dL 2.4(L)  Some recent data might be hidden   Lab Results  Component Value Date   TSH 2.150 06/23/2014   Lab Results  Component Value Date   BUN 19 05/13/2016   BUN 19 05/08/2016   BUN 21 (H) 05/07/2016   Lab Results  Component Value Date   HGBA1C 8.0 (H) 04/15/2016   HGBA1C 8.4 (H) 01/28/2016   HGBA1C 9.7 (H) 12/11/2015    Assessment/Plan  1. Statin-induced rhabdomyolysis -CK, future - Sedimentation rate, future  2. Other iron deficiency anemia - CBC with Differential/Platelet; Future - Reticulocytes; Future  3. Cirrhosis of liver with ascites, unspecified hepatic cirrhosis type ([place]) - Comprehensive metabolic panel; Future  4. Controlled type 2 diabetes mellitus with hyperglycemia, with long-term current use of insulin (HCC) The current medical regimen is effective;  continue present plan and medications.  5. Uncontrolled type 2 diabetes mellitus with other circulatory complication, with long-term current use of insulin (HCC) The current medical regimen is effective;  continue present plan and medications. - Hemoglobin A1c; Future - Comprehensive metabolic panel; Future - Microalbumin, urine; Future  6. Essential hypertension The current medical regimen is effective;  continue present plan and medications. I expect BP to rise  as she recovers. - Comprehensive metabolic panel; Future  7. Pure hypercholesterolemia -lipids, future  8. Coronary artery disease involving native coronary artery of native heart  without angina pectoris stable  9. Leg cramps -CK, future - Sedimentation rate, future

## 2016-05-20 ENCOUNTER — Ambulatory Visit (HOSPITAL_COMMUNITY): Payer: Medicare Other

## 2016-05-23 ENCOUNTER — Ambulatory Visit (HOSPITAL_COMMUNITY): Payer: Medicare Other

## 2016-05-25 ENCOUNTER — Ambulatory Visit (HOSPITAL_COMMUNITY): Payer: Medicare Other

## 2016-05-27 ENCOUNTER — Ambulatory Visit (HOSPITAL_COMMUNITY): Payer: Medicare Other

## 2016-05-29 ENCOUNTER — Other Ambulatory Visit: Payer: Self-pay | Admitting: Internal Medicine

## 2016-05-30 ENCOUNTER — Ambulatory Visit (HOSPITAL_COMMUNITY): Payer: Medicare Other

## 2016-06-01 ENCOUNTER — Ambulatory Visit (HOSPITAL_COMMUNITY): Payer: Medicare Other

## 2016-06-03 ENCOUNTER — Ambulatory Visit (HOSPITAL_COMMUNITY): Payer: Medicare Other

## 2016-06-06 ENCOUNTER — Ambulatory Visit (HOSPITAL_COMMUNITY): Payer: Medicare Other

## 2016-06-08 ENCOUNTER — Other Ambulatory Visit: Payer: Self-pay

## 2016-06-08 ENCOUNTER — Ambulatory Visit (HOSPITAL_COMMUNITY): Payer: Medicare Other

## 2016-06-08 DIAGNOSIS — D508 Other iron deficiency anemias: Secondary | ICD-10-CM

## 2016-06-10 ENCOUNTER — Ambulatory Visit (HOSPITAL_COMMUNITY): Payer: Medicare Other

## 2016-06-13 ENCOUNTER — Ambulatory Visit (HOSPITAL_COMMUNITY): Payer: Medicare Other

## 2016-06-15 ENCOUNTER — Ambulatory Visit (HOSPITAL_COMMUNITY): Payer: Medicare Other

## 2016-06-15 ENCOUNTER — Ambulatory Visit: Payer: Medicare Other | Admitting: Cardiovascular Disease

## 2016-06-16 ENCOUNTER — Other Ambulatory Visit: Payer: Medicare Other

## 2016-06-16 ENCOUNTER — Encounter (HOSPITAL_COMMUNITY): Payer: Self-pay | Admitting: Emergency Medicine

## 2016-06-16 ENCOUNTER — Ambulatory Visit (HOSPITAL_COMMUNITY)
Admission: EM | Admit: 2016-06-16 | Discharge: 2016-06-16 | Disposition: A | Discharge status: Home / Self Care | Payer: Medicare Other | Attending: Emergency Medicine | Admitting: Emergency Medicine

## 2016-06-16 DIAGNOSIS — M6282 Rhabdomyolysis: Secondary | ICD-10-CM

## 2016-06-16 DIAGNOSIS — I10 Essential (primary) hypertension: Secondary | ICD-10-CM | POA: Diagnosis not present

## 2016-06-16 DIAGNOSIS — R188 Other ascites: Secondary | ICD-10-CM

## 2016-06-16 DIAGNOSIS — T466X5A Adverse effect of antihyperlipidemic and antiarteriosclerotic drugs, initial encounter: Secondary | ICD-10-CM

## 2016-06-16 DIAGNOSIS — J111 Influenza due to unidentified influenza virus with other respiratory manifestations: Secondary | ICD-10-CM

## 2016-06-16 DIAGNOSIS — Z794 Long term (current) use of insulin: Secondary | ICD-10-CM

## 2016-06-16 DIAGNOSIS — D508 Other iron deficiency anemias: Secondary | ICD-10-CM | POA: Diagnosis not present

## 2016-06-16 DIAGNOSIS — R252 Cramp and spasm: Secondary | ICD-10-CM

## 2016-06-16 DIAGNOSIS — R69 Illness, unspecified: Secondary | ICD-10-CM

## 2016-06-16 DIAGNOSIS — R6889 Other general symptoms and signs: Secondary | ICD-10-CM | POA: Diagnosis not present

## 2016-06-16 DIAGNOSIS — K7460 Unspecified cirrhosis of liver: Secondary | ICD-10-CM | POA: Diagnosis not present

## 2016-06-16 DIAGNOSIS — E1159 Type 2 diabetes mellitus with other circulatory complications: Secondary | ICD-10-CM | POA: Diagnosis not present

## 2016-06-16 DIAGNOSIS — IMO0002 Reserved for concepts with insufficient information to code with codable children: Secondary | ICD-10-CM

## 2016-06-16 DIAGNOSIS — E1165 Type 2 diabetes mellitus with hyperglycemia: Secondary | ICD-10-CM

## 2016-06-16 LAB — CBC WITH DIFFERENTIAL/PLATELET
BASOS ABS: 41 {cells}/uL (ref 0–200)
Basophils Relative: 1 %
EOS ABS: 82 {cells}/uL (ref 15–500)
EOS PCT: 2 %
HCT: 35.5 % (ref 35.0–45.0)
Hemoglobin: 10.9 g/dL — ABNORMAL LOW (ref 11.7–15.5)
LYMPHS PCT: 27 %
Lymphs Abs: 1107 cells/uL (ref 850–3900)
MCH: 25.3 pg — ABNORMAL LOW (ref 27.0–33.0)
MCHC: 30.7 g/dL — AB (ref 32.0–36.0)
MCV: 82.6 fL (ref 80.0–100.0)
Monocytes Absolute: 533 cells/uL (ref 200–950)
Monocytes Relative: 13 %
NEUTROS ABS: 2337 {cells}/uL (ref 1500–7800)
NEUTROS PCT: 57 %
PLATELETS: 143 10*3/uL (ref 140–400)
RBC: 4.3 MIL/uL (ref 3.80–5.10)
RDW: 21.5 % — AB (ref 11.0–15.0)
WBC: 4.1 10*3/uL (ref 3.8–10.8)

## 2016-06-16 LAB — COMPREHENSIVE METABOLIC PANEL
ALBUMIN: 3.5 g/dL — AB (ref 3.6–5.1)
ALT: 35 U/L — ABNORMAL HIGH (ref 6–29)
AST: 45 U/L — AB (ref 10–35)
Alkaline Phosphatase: 62 U/L (ref 33–130)
BUN: 25 mg/dL (ref 7–25)
CALCIUM: 10 mg/dL (ref 8.6–10.4)
CO2: 26 mmol/L (ref 20–31)
Chloride: 101 mmol/L (ref 98–110)
Creat: 1.02 mg/dL — ABNORMAL HIGH (ref 0.50–0.99)
GLUCOSE: 108 mg/dL — AB (ref 65–99)
POTASSIUM: 4.3 mmol/L (ref 3.5–5.3)
Sodium: 139 mmol/L (ref 135–146)
Total Bilirubin: 0.6 mg/dL (ref 0.2–1.2)
Total Protein: 7.1 g/dL (ref 6.1–8.1)

## 2016-06-16 LAB — RETICULOCYTES
ABS Retic: 51600 cells/uL (ref 20000–80000)
RBC.: 4.3 MIL/uL (ref 3.80–5.10)
RETIC CT PCT: 1.2 %

## 2016-06-16 MED ORDER — OSELTAMIVIR PHOSPHATE 75 MG PO CAPS: 75.0000 mg | ORAL_CAPSULE | Freq: Two times a day (BID) | ORAL | 0 refills | Status: DC

## 2016-06-16 MED ORDER — BENZONATATE 100 MG PO CAPS: 100.0000 mg | ORAL_CAPSULE | Freq: Three times a day (TID) | ORAL | 0 refills | Status: DC

## 2016-06-16 NOTE — Progress Notes (Signed)
Cardiology Office Note:    Date:  06/29/2016   ID:  [name], DOB 08/29/1945, MRN [id number]  PCP:  Jeanmarie Hubert, MD  Cardiologist:  Dr. [name]   Electrophysiologist:  n/a GI:  Dr. [name]  Referring MD: Estill Dooms, MD   Chief Complaint  Patient presents with  . Coronary Artery Disease    History of Present Illness:    [name] is a 71 y.o. female with a hx of CAD s/p CABG, PVCs, HTN, HL, DM2, GERD.  She initially presented with a NSTEMI in 9/17.  She had 3 V CAD on LHC with EF 45-50.  She underwent CABG with L-LAD, S-RI, S-RCA.  She was last seen in clinic by Angelena Form, PA-C in 10/17.  She was placed back on Lasix for LE edema.  She then saw Dr. Roxan Hockey for follow up and was noted to have abdominal distention/ascites.  Her Lasix was increased to 40 bid.  A follow up echocardiogram demonstrated EF 35-00, mild diastolic dysfunction, L pleural effusion, PASP 38.  There was no pericardial effusion.  Abdominal CT demonstrated findings consistent with cirrhosis, gastroesophageal perigastric varices and moderate to large abdominopelvic ascites. She has also had problems with diarrhea.  Of note, CDiff was neg.  She was referred to gastroenterology. She saw Dr. [name] 03/09/16. She was placed back on Lasix 40 twice a day and started on spironolactone 100 twice a day.  She underwent paracentesis with removal of 4 L of fluid.  Cytology was neg for malignant cells.   She denies syncope, orthopnea, PND, edema. She denies recurrent abdominal distention.    Prior CV and other studies that were reviewed today include:    Echo 03/08/16 Mild concentric LVH, EF 60-65, normal wall motion, grade 1 diastolic dysfunction, very mild aortic stenosis suggested by gradients (mean 12), mild AI, MAC, mild MR, mild to moderate LAE, PASP 38, left pleural effusion  Abdominal CT 03/06/16 IMPRESSION: Cirrhosis.  No focal hepatic lesion is seen. Moderate to large abdominopelvic  ascites. Gastroesophageal and perigastric varices.  Portal vein is patent.  Carotid US 9/17 Bilateral ICA 1-39  ABIs 9/17 Normal  LHC 9/17 Acute coronary syndrome with recent total occlusion of the dominant RCA, high-grade obstruction in the proximal LAD which Collateralizes the RCA, and high-grade obstruction and a moderate size ramus intermedius branch. Left ventricular systolic dysfunction with inferobasal moderate hypokinesis and EF of 45-50%. Upper normal LV EDP.   Past Medical History:  Diagnosis Date  . Allergy   . Arthritis    neck  . Cataract    bilateral - MD monitoring cataracts  . CHF (congestive heart failure) ([place])   . Chronic kidney disease, stage I    DR OTTELIN  HX UTIS  . Cirrhosis ([place])   . Cramp of limb   . Diabetes mellitus   . Dysphagia, unspecified(787.20)   . Dysuria   . Epistaxis   . GERD (gastroesophageal reflux disease)   . Heart murmur    NO CARDIOLOGIST  DX FOR YEARS ASYMPTOMATIC  . Lumbago   . Neoplasm of uncertain behavior of skin   . Nonspecific elevation of levels of transaminase or lactic acid dehydrogenase (LDH)   . Osteoarthrosis, unspecified whether generalized or localized, unspecified site   . Other and unspecified hyperlipidemia    diet controlled  . Pain in joint, shoulder region   . Paresthesias 04/01/2015  . Postablative ovarian failure   . Trochanteric bursitis of left hip 12/15/2015  .  Type 2 diabetes mellitus without complication ([place])   . Unspecified essential hypertension    no meds    Past Surgical History:  Procedure Laterality Date  . CARDIAC CATHETERIZATION N/A 01/27/2016   Procedure: Left Heart Cath and Coronary Angiography;  Surgeon: Belva Crome, MD;  Location: [facility];  Service: Cardiovascular;  Laterality: N/A;  . COLONOSCOPY  2012   [facility] - polyp  . CORONARY ARTERY BYPASS GRAFT N/A 01/28/2016   Procedure: CORONARY ARTERY BYPASS GRAFTING (CABG) x 3 USING RIGHT LEG GREATER SAPHENOUS VEIN GRAFT;   Surgeon: Melrose Nakayama, MD;  Location: [facility];  Service: Open Heart Surgery;  Laterality: N/A;  . ENDOVEIN HARVEST OF GREATER SAPHENOUS VEIN Right 01/28/2016   Procedure: ENDOVEIN HARVEST OF GREATER SAPHENOUS VEIN;  Surgeon: Melrose Nakayama, MD;  Location: [facility];  Service: Open Heart Surgery;  Laterality: Right;  . MAXIMUM ACCESS (MAS)POSTERIOR LUMBAR INTERBODY FUSION (PLIF) 1 LEVEL Left 06/10/2015   Procedure: FOR MAXIMUM ACCESS (MAS) POSTERIOR LUMBAR INTERBODY FUSION (PLIF) LUMBAR THREE-FOUR EXTRAFORAMINAL MICRODISCECTOMY LUMBAR FIVE-SACRAL ONE LEFT;  Surgeon: Eustace Moore, MD;  Location: [facility];  Service: Neurosurgery;  Laterality: Left;  . TEE WITHOUT CARDIOVERSION N/A 01/28/2016   Procedure: TRANSESOPHAGEAL ECHOCARDIOGRAM (TEE);  Surgeon: Melrose Nakayama, MD;  Location: [facility];  Service: Open Heart Surgery;  Laterality: N/A;  . TUBAL LIGATION  1982   Dr Connye Burkitt  . UPPER GASTROINTESTINAL ENDOSCOPY    . VAGINAL HYSTERECTOMY  1997   Dr Rande Lawman    Current Medications: Current Meds  Medication Sig  . acetaminophen (TYLENOL) 325 MG tablet Take 2 tablets (650 mg total) by mouth every 6 (six) hours as needed for mild pain.  . Aromatic Inhalants (VICKS VAPOR IN) Vicks Vapor Rub apply small amount to outside of nose to help breathing  . aspirin EC 325 MG EC tablet Take 1 tablet (325 mg total) by mouth daily.  . Biotin 10000 MCG TABS Take 10,000 mcg by mouth every morning.  . calcium carbonate (OS-CAL) 600 MG TABS Take 600 mg by mouth 2 (two) times daily with a meal.    . cholecalciferol (VITAMIN D) 1000 UNITS tablet Take 1,000 Units by mouth daily.    . ciprofloxacin (CIPRO) 250 MG tablet Take 250 mg by mouth 2 (two) times daily as needed (to prevent UTIs). Take one tablet after intercourse to prevent urinary tract infection  . Cyanocobalamin (VITAMIN B 12 PO) Take 1,000 mcg by mouth daily.    . ferrous sulfate 325 (65 FE) MG tablet TAKE 1 TABLET(325 MG) BY MOUTH TWICE DAILY WITH A  MEAL  . fish oil-omega-3 fatty acids 1000 MG capsule Take 1 g by mouth daily.    . fluticasone (FLONASE) 50 MCG/ACT nasal spray Place 2 sprays into both nostrils daily as needed for allergies or rhinitis.  . furosemide (LASIX) 40 MG tablet Take 40 mg by mouth daily.  . insulin detemir (LEVEMIR) 100 UNIT/ML injection Inject 0.45 mLs (45 Units total) into the skin at bedtime.  [name] Kitchen MAGNESIUM PO Take 500 mg by mouth 2 (two) times daily in the am and at bedtime..  . meloxicam (MOBIC) 15 MG tablet TAKE 1 TABLET BY MOUTH DAILY TO HELP ARTHRITIS AND BURSITIS  . metFORMIN (GLUCOPHAGE) 1000 MG tablet TAKE 1 TABLET BY MOUTH TWICE DAILY TO TREAT DIABETES  . metoprolol tartrate (LOPRESSOR) 25 MG tablet Take one and half tablets by mouth twice daily for blood pressure  . Multiple Vitamins-Minerals (MULTIVITAMIN WITH MINERALS)  tablet Take 1 tablet by mouth daily.    [name] Kitchen omeprazole (PRILOSEC) 20 MG capsule TAKE ONE CAPSULE BY MOUTH DAILY  . Polyethyl Glycol-Propyl Glycol (SYSTANE OP) Place 1 drop into both eyes 2 (two) times daily.  . potassium chloride SA (K-DUR,KLOR-CON) 20 MEQ tablet Take 20 mEq by mouth at bedtime.   . Probiotic Product (PROBIOTIC DAILY PO) Take 1 capsule by mouth daily. Digestive Advantage Probiotic  . spironolactone (ALDACTONE) 100 MG tablet Take 1 tablet (100 mg total) by mouth daily.     Allergies:   Kiwi extract; Tdap [diphth-acell pertussis-tetanus]; Statins; Latex; and Tramadol   Social History   Social History  . Marital status: Married    Spouse name: N/A  . Number of children: 6  . Years of education: N/A   Occupational History  . retired    Social History Main Topics  . Smoking status: Never Smoker  . Smokeless tobacco: Never Used  . Alcohol use No  . Drug use: No  . Sexual activity: Yes    Partners: Male    Birth control/ protection: Post-menopausal, Surgical     Comment: Hysterectomy   Other Topics Concern  . None   Social History Narrative  . None      Family History:  The patient's family history includes Arthritis in her brother and sister; Breast cancer in her mother; Heart disease in her maternal grandfather, maternal grandmother, paternal grandfather, and paternal grandmother; Liver cancer in her brother; Lung cancer in her father.   ROS:   Please see the history of present illness.    Review of Systems  Constitution: Positive for chills.  Respiratory: Positive for cough.   Neurological: Positive for dizziness.  Psychiatric/Behavioral: The patient is nervous/anxious.    All other systems reviewed and are negative.   EKGs/Labs/Other Test Reviewed:    EKG:   04/05/16 NSR, HR 84, NSSTTW changes, QTc 479, PVC, similar to prior tracings.  Recent Labs: 03/06/2016: B Natriuretic Peptide 307.8 05/08/2016: Magnesium 1.8 06/24/2016: ALT 42; BUN 20; Creatinine, Ser 1.03; Hemoglobin 11.9; Platelets 159.0; Potassium 4.0; Sodium 138   Recent Lipid Panel    Component Value Date/Time   CHOL 105 04/15/2016 1002   CHOL 194 07/24/2015 0933   TRIG 67 04/15/2016 1002   HDL 38 (L) 04/15/2016 1002   HDL 65 07/24/2015 0933   CHOLHDL 2.8 04/15/2016 1002   VLDL 13 04/15/2016 1002   LDLCALC 54 04/15/2016 1002   LDLCALC 112 (H) 07/24/2015 0933     Physical Exam:    VS:  BP (!) 90/50   Pulse 64   Ht _0  (1.6 m)   Wt 135 lb 1.9 oz (61.3 kg)   SpO2 98%   BMI 23.94 kg/m     Wt Readings from Last 3 Encounters:  06/29/16 135 lb 1.9 oz (61.3 kg)  06/24/16 132 lb (59.9 kg)  06/21/16 133 lb 12.8 oz (60.7 kg)     Physical Exam  Constitutional: She is oriented to person, place, and time. She appears well-developed and well-nourished. No distress.  HENT:  Head: Normocephalic and atraumatic.  Eyes: No scleral icterus.  Neck: No JVD present.  Cardiovascular: Normal rate and regular rhythm.   Murmur heard.  Low-pitched systolic murmur is present with a grade of 2/6  at the upper left sternal border Pulmonary/Chest: Effort normal. She  has no wheezes. She has no rales.  Abdominal: Soft. There is no tenderness.  Musculoskeletal: She exhibits no edema.  Neurological: She is alert  and oriented to person, place, and time.  Skin: Skin is warm and dry.  Psychiatric: She has a normal mood and affect.    ASSESSMENT:    1. Coronary artery disease involving native coronary artery of native heart without angina pectoris    PLAN:    In order of problems listed above:  1. CAD - s/p NSTEMI followed by CABG in 9/17.  She is progressing well.    -  Would avoid adding Plavix given recent dx of cirrhosis and esophageal varices.    -  Continue ASA 325 (90 days post CABG, then can reduce to 81 QD)  -  Continue beta-blocker.  2. Cirrhosis - Question if 2/2 to NASH.  She is followed by GI.  She is now on Lasix and Spironolactone.  She is s/p paracentesis.  It is ok if Metoprolol needs to be changed to Propranolol (per GI).    3. HL - Her statin was held in the setting of newly dx cirrhosis.  I will check with Dr. [name] if ok to resume.  4. HTN - BP controlled.   5. PVCs - EF is normal now post MI/CABG.  No need to pursue Holter.  6. Paresthesias - She notes bilateral thigh numbness.  She had lumbar back surgery with Dr. Ronnald Ramp in 2/17.  I have asked her to contact him with her symptoms.     Medication Adjustments/Labs and Tests Ordered: Current medicines are reviewed at length with the patient today.  Concerns regarding medicines are outlined above.  Medication changes, Labs and Tests ordered today are outlined in the Patient Instructions noted below. Patient Instructions  Medication Instructions:  Your physician recommends that you continue on your current medications as directed. Please refer to the Current Medication list given to you today.  Labwork: NONE  Testing/Procedures: NONE  Follow-Up: Your physician wants you to follow-up in: 6 months with Dr. Johnsie Cancel. You will receive a reminder letter in the mail two months in  advance. If you don't receive a letter, please call our office to schedule the follow-up appointment.   If you need a refill on your cardiac medications before your next appointment, please call your pharmacy.     Signed, [name], MD  06/29/2016 12:18 PM    [facility] [address], [place], [place]  [id number] Phone: [phone]; Fax: [phone]

## 2016-06-16 NOTE — ED Triage Notes (Signed)
Pt c/o cold sx onset: this am  Sx include: dry cough, sneezing, HA, weakness, nasal drainage  Denies: fevers  Reports husband is being treated for flu  Taking: OTC cold meds w/temp relief.   A&O x4... NAD

## 2016-06-16 NOTE — Discharge Instructions (Signed)
You most likely influenza. I advise rest, plenty of fluids and management of symptoms with over the counter medicines. For symptoms you may take Tylenol as needed every 4-6 hours for body aches or fever, not to exceed 4,000 mg a day, Take mucinex or mucinex DM ever 12 hours with a full glass of water, you may use an inhaled steroid such as Flonase, 2 sprays each nostril once a day for congestion, or an antihistamine such as Claritin or Zyrtec once a day. For cough, I have prescribed a medication called Tessalon. Take 1 tablet every 8 hours as needed for your cough. For treatment I have prescribed tamiflu, take 1 tablet twice a day for 5 days.  Should your symptoms worsen or fail to resolve, follow up with your primary care provider or return to clinic.

## 2016-06-16 NOTE — ED Provider Notes (Signed)
CSN: [id number]     Arrival date & time 06/16/16  1256 History   First MD Initiated Contact with Patient 06/16/16 1438     Chief Complaint  Patient presents with  . URI   (Consider location/radiation/quality/duration/timing/severity/associated sxs/prior Treatment) 71 year old female presents to clinic with chief complaint of flu like symptoms. Husband, son, daughter, and two grandchildren all positive with influenza, Her symptoms started 4 am this morning. Fever, body aches, muscle aches, headache, sneezing, and congestion, no nausea, vomiting, or diarrhea, is taking OTC medications for symptom relief.    The history is provided by the patient.  URI    Past Medical History:  Diagnosis Date  . Allergy   . Arthritis    neck  . Cataract    bilateral - MD monitoring cataracts  . CHF (congestive heart failure) ([place])   . Chronic kidney disease, stage I    DR OTTELIN  HX UTIS  . Cirrhosis ([place])   . Cramp of limb   . Diabetes mellitus   . Dysphagia, unspecified(787.20)   . Dysuria   . Epistaxis   . GERD (gastroesophageal reflux disease)   . Heart murmur    NO CARDIOLOGIST  DX FOR YEARS ASYMPTOMATIC  . Lumbago   . Neoplasm of uncertain behavior of skin   . Nonspecific elevation of levels of transaminase or lactic acid dehydrogenase (LDH)   . Osteoarthrosis, unspecified whether generalized or localized, unspecified site   . Other and unspecified hyperlipidemia    diet controlled  . Pain in joint, shoulder region   . Paresthesias 04/01/2015  . Postablative ovarian failure   . Trochanteric bursitis of left hip 12/15/2015  . Type 2 diabetes mellitus without complication ([place])   . Unspecified essential hypertension    no meds   Past Surgical History:  Procedure Laterality Date  . CARDIAC CATHETERIZATION N/A 01/27/2016   Procedure: Left Heart Cath and Coronary Angiography;  Surgeon: Belva Crome, MD;  Location: [facility];  Service: Cardiovascular;  Laterality: N/A;  .  COLONOSCOPY  2012   [facility] - polyp  . CORONARY ARTERY BYPASS GRAFT N/A 01/28/2016   Procedure: CORONARY ARTERY BYPASS GRAFTING (CABG) x 3 USING RIGHT LEG GREATER SAPHENOUS VEIN GRAFT;  Surgeon: Melrose Nakayama, MD;  Location: [facility];  Service: Open Heart Surgery;  Laterality: N/A;  . ENDOVEIN HARVEST OF GREATER SAPHENOUS VEIN Right 01/28/2016   Procedure: ENDOVEIN HARVEST OF GREATER SAPHENOUS VEIN;  Surgeon: Melrose Nakayama, MD;  Location: [facility];  Service: Open Heart Surgery;  Laterality: Right;  . MAXIMUM ACCESS (MAS)POSTERIOR LUMBAR INTERBODY FUSION (PLIF) 1 LEVEL Left 06/10/2015   Procedure: FOR MAXIMUM ACCESS (MAS) POSTERIOR LUMBAR INTERBODY FUSION (PLIF) LUMBAR THREE-FOUR EXTRAFORAMINAL MICRODISCECTOMY LUMBAR FIVE-SACRAL ONE LEFT;  Surgeon: Eustace Moore, MD;  Location: [facility];  Service: Neurosurgery;  Laterality: Left;  . TEE WITHOUT CARDIOVERSION N/A 01/28/2016   Procedure: TRANSESOPHAGEAL ECHOCARDIOGRAM (TEE);  Surgeon: Melrose Nakayama, MD;  Location: [facility];  Service: Open Heart Surgery;  Laterality: N/A;  . TUBAL LIGATION  1982   Dr Connye Burkitt  . UPPER GASTROINTESTINAL ENDOSCOPY    . VAGINAL HYSTERECTOMY  1997   Dr Rande Lawman   Family History  Problem Relation Age of Onset  . Lung cancer Father   . Arthritis Sister   . Arthritis Brother   . Heart disease Maternal Grandmother   . Heart disease Maternal Grandfather   . Heart disease Paternal Grandmother   . Heart disease Paternal Grandfather   .  Breast cancer Mother   . Liver cancer Brother   . Colon cancer Neg Hx   . Esophageal cancer Neg Hx   . Rectal cancer Neg Hx   . Stomach cancer Neg Hx    Social History  Substance Use Topics  . Smoking status: Never Smoker  . Smokeless tobacco: Never Used  . Alcohol use No   OB History    No data available     Review of Systems  Reason unable to perform ROS: as covered in HPI.  All other systems reviewed and are negative.   Allergies  Kiwi extract; Tdap  [diphth-acell pertussis-tetanus]; Statins; Latex; and Tramadol  Home Medications   Prior to Admission medications   Medication Sig Start Date End Date Taking? Authorizing Provider  aspirin EC 325 MG EC tablet Take 1 tablet (325 mg total) by mouth daily. 02/03/16  Yes Erin R Barrett, PA-C  Biotin 10000 MCG TABS Take 10,000 mcg by mouth every morning.   Yes Historical Provider, MD  calcium carbonate (OS-CAL) 600 MG TABS Take 600 mg by mouth 2 (two) times daily with a meal.     Yes Historical Provider, MD  cholecalciferol (VITAMIN D) 1000 UNITS tablet Take 1,000 Units by mouth daily.     Yes Historical Provider, MD  Cyanocobalamin (VITAMIN B 12 PO) Take 1,000 mcg by mouth daily.     Yes Historical Provider, MD  ferrous sulfate 325 (65 FE) MG tablet Take 1 tablet (325 mg total) by mouth 2 (two) times daily with a meal. 05/09/16  Yes Nishant Dhungel, MD  fish oil-omega-3 fatty acids 1000 MG capsule Take 1 g by mouth daily.     Yes Historical Provider, MD  furosemide (LASIX) 40 MG tablet Take 40 mg by mouth 2 (two) times daily.   Yes Historical Provider, MD  Glucosamine 500 MG CAPS Take 2,000 mg by mouth 2 (two) times daily.   Yes Historical Provider, MD  insulin detemir (LEVEMIR) 100 UNIT/ML injection Inject 0.45 mLs (45 Units total) into the skin at bedtime. 04/20/16  Yes Estill Dooms, MD  MAGNESIUM PO Take 500 mg by mouth 2 (two) times daily in the am and at bedtime..   Yes Historical Provider, MD  meloxicam (MOBIC) 15 MG tablet TAKE 1 TABLET BY MOUTH DAILY TO HELP ARTHRITIS AND BURSITIS 05/12/16  Yes Estill Dooms, MD  metFORMIN (GLUCOPHAGE) 1000 MG tablet TAKE 1 TABLET BY MOUTH TWICE DAILY TO TREAT DIABETES 05/10/16  Yes Estill Dooms, MD  metoprolol tartrate (LOPRESSOR) 25 MG tablet Take one and half tablets by mouth twice daily for blood pressure 05/16/16  Yes Estill Dooms, MD  Multiple Vitamins-Minerals (MULTIVITAMIN WITH MINERALS) tablet Take 1 tablet by mouth daily.     Yes Historical Provider,  MD  omeprazole (PRILOSEC) 20 MG capsule TAKE ONE CAPSULE BY MOUTH DAILY Patient taking differently: TAKE 20 MG BY MOUTH DAILY 03/18/16  Yes Estill Dooms, MD  Polyethyl Glycol-Propyl Glycol (SYSTANE OP) Place 1 drop into both eyes 2 (two) times daily.   Yes Historical Provider, MD  potassium chloride SA (K-DUR,KLOR-CON) 20 MEQ tablet Take 20 mEq by mouth at bedtime.    Yes Historical Provider, MD  Probiotic Product (PROBIOTIC DAILY PO) Take 1 capsule by mouth daily. Digestive Advantage Probiotic   Yes Historical Provider, MD  acetaminophen (TYLENOL) 325 MG tablet Take 2 tablets (650 mg total) by mouth every 6 (six) hours as needed for mild pain. Patient taking differently: Take 325 mg by  mouth every 6 (six) hours as needed for mild pain.  02/03/16   Erin R Barrett, PA-C  Aromatic Inhalants (VICKS VAPOR IN) Vicks Vapor Rub apply small amount to outside of nose to help breathing    Historical Provider, MD  benzonatate (TESSALON) 100 MG capsule Take 1 capsule (100 mg total) by mouth every 8 (eight) hours. 06/16/16   Barnet Glasgow, NP  ciprofloxacin (CIPRO) 250 MG tablet Take 250 mg by mouth 2 (two) times daily as needed (to prevent UTIs). Take one tablet after intercourse to prevent urinary tract infection    Historical Provider, MD  dextromethorphan-guaiFENesin (MUCINEX DM) 30-600 MG 12hr tablet Take 1 tablet by mouth 2 (two) times daily as needed for cough. 02/03/16   Erin R Barrett, PA-C  fluticasone (FLONASE) 50 MCG/ACT nasal spray Place 2 sprays into both nostrils daily as needed for allergies or rhinitis.    Historical Provider, MD  oseltamivir (TAMIFLU) 75 MG capsule Take 1 capsule (75 mg total) by mouth every 12 (twelve) hours. 06/16/16   Barnet Glasgow, NP  spironolactone (ALDACTONE) 100 MG tablet Take 1 tablet (100 mg total) by mouth 2 (two) times daily. 03/09/16 05/07/16  [name], MD   Meds Ordered and Administered this Visit  Medications - No data to display  BP (!) 100/52 (BP  Location: Right Arm)   Pulse 63   Temp 98.6 F (37 C) (Oral)   Resp 18   SpO2 95%  No data found.   Physical Exam  Constitutional: She is oriented to person, place, and time. She appears well-developed and well-nourished. She appears ill. No distress.  HENT:  Head: Normocephalic and atraumatic.  Right Ear: External ear normal.  Left Ear: External ear normal.  Nose: Nose normal. Right sinus exhibits no maxillary sinus tenderness and no frontal sinus tenderness. Left sinus exhibits no maxillary sinus tenderness and no frontal sinus tenderness.  Mouth/Throat: Uvula is midline, oropharynx is clear and moist and mucous membranes are normal. No oropharyngeal exudate.  Eyes: Pupils are equal, round, and reactive to light.  Neck: Normal range of motion. Neck supple. No JVD present.  Cardiovascular: Normal rate and regular rhythm.   Pulmonary/Chest: Effort normal and breath sounds normal. No respiratory distress. She has no wheezes. She has no rhonchi.  Abdominal: Soft. Bowel sounds are normal. She exhibits no distension. There is no tenderness. There is no guarding.  Lymphadenopathy:       Head (right side): Tonsillar adenopathy present. No submandibular adenopathy present.       Head (left side): Tonsillar adenopathy present. No submandibular adenopathy present.    She has no cervical adenopathy.  Neurological: She is alert and oriented to person, place, and time.  Skin: Skin is warm and dry. Capillary refill takes less than 2 seconds. She is not diaphoretic.  Psychiatric: She has a normal mood and affect.  Nursing note and vitals reviewed.   Urgent Care Course     Procedures (including critical care time)  Labs Review Labs Reviewed - No data to display  Imaging Review No results found.   Visual Acuity Review  Right Eye Distance:   Left Eye Distance:   Bilateral Distance:    Right Eye Near:   Left Eye Near:    Bilateral Near:         MDM   1. Flu-like symptoms    2. Influenza-like illness   You most likely influenza. I advise rest, plenty of fluids and management of symptoms with over the  counter medicines. For symptoms you may take Tylenol as needed every 4-6 hours for body aches or fever, not to exceed 4,000 mg a day, Take mucinex or mucinex DM ever 12 hours with a full glass of water, you may use an inhaled steroid such as Flonase, 2 sprays each nostril once a day for congestion, or an antihistamine such as Claritin or Zyrtec once a day. For cough, I have prescribed a medication called Tessalon. Take 1 tablet every 8 hours as needed for your cough. For treatment I have prescribed tamiflu, take 1 tablet twice a day for 5 days.  Should your symptoms worsen or fail to resolve, follow up with your primary care provider or return to clinic.       Barnet Glasgow, NP 06/16/16 [phone]

## 2016-06-17 ENCOUNTER — Ambulatory Visit (HOSPITAL_COMMUNITY): Payer: Medicare Other

## 2016-06-17 LAB — SEDIMENTATION RATE: SED RATE: 17 mm/h (ref 0–30)

## 2016-06-17 LAB — HEMOGLOBIN A1C
Hgb A1c MFr Bld: 7.8 % — ABNORMAL HIGH (ref <5.7)
Mean Plasma Glucose: 177 mg/dL

## 2016-06-17 LAB — MICROALBUMIN, URINE: Microalb, Ur: 1.5 mg/dL

## 2016-06-20 ENCOUNTER — Ambulatory Visit (HOSPITAL_COMMUNITY): Payer: Medicare Other

## 2016-06-21 ENCOUNTER — Ambulatory Visit (INDEPENDENT_AMBULATORY_CARE_PROVIDER_SITE_OTHER): Payer: Medicare Other | Admitting: Internal Medicine

## 2016-06-21 ENCOUNTER — Encounter: Payer: Self-pay | Admitting: Internal Medicine

## 2016-06-21 VITALS — BP 116/74 | HR 58 | Temp 98.4°F | Resp 16 | Ht 63.0 in | Wt 133.8 lb

## 2016-06-21 DIAGNOSIS — I10 Essential (primary) hypertension: Secondary | ICD-10-CM | POA: Diagnosis not present

## 2016-06-21 DIAGNOSIS — E1165 Type 2 diabetes mellitus with hyperglycemia: Secondary | ICD-10-CM | POA: Diagnosis not present

## 2016-06-21 DIAGNOSIS — E1159 Type 2 diabetes mellitus with other circulatory complications: Secondary | ICD-10-CM | POA: Diagnosis not present

## 2016-06-21 DIAGNOSIS — R188 Other ascites: Secondary | ICD-10-CM

## 2016-06-21 DIAGNOSIS — K7460 Unspecified cirrhosis of liver: Secondary | ICD-10-CM

## 2016-06-21 DIAGNOSIS — Z794 Long term (current) use of insulin: Secondary | ICD-10-CM

## 2016-06-21 DIAGNOSIS — D508 Other iron deficiency anemias: Secondary | ICD-10-CM | POA: Diagnosis not present

## 2016-06-21 DIAGNOSIS — K219 Gastro-esophageal reflux disease without esophagitis: Secondary | ICD-10-CM | POA: Diagnosis not present

## 2016-06-21 DIAGNOSIS — I2510 Atherosclerotic heart disease of native coronary artery without angina pectoris: Secondary | ICD-10-CM | POA: Diagnosis not present

## 2016-06-21 DIAGNOSIS — IMO0002 Reserved for concepts with insufficient information to code with codable children: Secondary | ICD-10-CM

## 2016-06-21 NOTE — Progress Notes (Signed)
Facility  [facility]    Place of Service:   OFFICE    Allergies  Allergen Reactions  . Kiwi Extract Anaphylaxis  . Tdap [Diphth-Acell Pertussis-Tetanus] Swelling and Other (See Comments)    Swelling at injection site, gets very hot  . Statins     RHABDOMYOLYSIS  . Latex Itching, Dermatitis and Rash  . Tramadol Nausea And Vomiting    Chief Complaint  Patient presents with  . Medical Management of Chronic Issues    four week follow-up with labs   . Medication Refill    No refills needed at this time     HPI:  Hospitalized 05/07/16 to 05/09/16 for statin induced rhabdomyolysis.  Seen in ER 05/13/16. Anemic with hgb 8.7  Checked by me in office 05/18/16.  Seen in ED 06/16/16 with URI/ flu. Treated with Tamiflu.  She is recovering, but still feels quite weak. Poor appetite and 7# weight loss. She considers the weight loss a good thing.  Hx of ascites. Stomach is not feeling as tight. Some of the weight loss may be improvement in retained fluid.  Noted to be anemic at her last visit. Continues to use iron.. Anemia has improved.  Diabetic control has imroved.    Medications: Patient's Medications  New Prescriptions   No medications on file  Previous Medications   ACETAMINOPHEN (TYLENOL) 325 MG TABLET    Take 2 tablets (650 mg total) by mouth every 6 (six) hours as needed for mild pain.   AROMATIC INHALANTS (VICKS VAPOR IN)    Vicks Vapor Rub apply small amount to outside of nose to help breathing   ASPIRIN EC 325 MG EC TABLET    Take 1 tablet (325 mg total) by mouth daily.   BIOTIN 30865 MCG TABS    Take 10,000 mcg by mouth every morning.   CALCIUM CARBONATE (OS-CAL) 600 MG TABS    Take 600 mg by mouth 2 (two) times daily with a meal.     CHOLECALCIFEROL (VITAMIN D) 1000 UNITS TABLET    Take 1,000 Units by mouth daily.     CIPROFLOXACIN (CIPRO) 250 MG TABLET    Take 250 mg by mouth 2 (two) times daily as needed (to prevent UTIs). Take one tablet after intercourse to prevent  urinary tract infection   CYANOCOBALAMIN (VITAMIN B 12 PO)    Take 1,000 mcg by mouth daily.     DEXTROMETHORPHAN-GUAIFENESIN (MUCINEX DM) 30-600 MG 12HR TABLET    Take 1 tablet by mouth 2 (two) times daily as needed for cough.   FERROUS SULFATE 325 (65 FE) MG TABLET    Take 1 tablet (325 mg total) by mouth 2 (two) times daily with a meal.   FISH OIL-OMEGA-3 FATTY ACIDS 1000 MG CAPSULE    Take 1 g by mouth daily.     FLUTICASONE (FLONASE) 50 MCG/ACT NASAL SPRAY    Place 2 sprays into both nostrils daily as needed for allergies or rhinitis.   FUROSEMIDE (LASIX) 40 MG TABLET    Take 40 mg by mouth 2 (two) times daily.   GLUCOSAMINE 500 MG CAPS    Take 2,000 mg by mouth 2 (two) times daily.   INSULIN DETEMIR (LEVEMIR) 100 UNIT/ML INJECTION    Inject 0.45 mLs (45 Units total) into the skin at bedtime.   MAGNESIUM PO    Take 500 mg by mouth 2 (two) times daily in the am and at bedtime..   MELOXICAM (MOBIC) 15 MG TABLET    TAKE  1 TABLET BY MOUTH DAILY TO HELP ARTHRITIS AND BURSITIS   METFORMIN (GLUCOPHAGE) 1000 MG TABLET    TAKE 1 TABLET BY MOUTH TWICE DAILY TO TREAT DIABETES   METOPROLOL TARTRATE (LOPRESSOR) 25 MG TABLET    Take one and half tablets by mouth twice daily for blood pressure   MULTIPLE VITAMINS-MINERALS (MULTIVITAMIN WITH MINERALS) TABLET    Take 1 tablet by mouth daily.     OMEPRAZOLE (PRILOSEC) 20 MG CAPSULE    TAKE ONE CAPSULE BY MOUTH DAILY   OSELTAMIVIR (TAMIFLU) 75 MG CAPSULE    Take 1 capsule (75 mg total) by mouth every 12 (twelve) hours.   POLYETHYL GLYCOL-PROPYL GLYCOL (SYSTANE OP)    Place 1 drop into both eyes 2 (two) times daily.   POTASSIUM CHLORIDE SA (K-DUR,KLOR-CON) 20 MEQ TABLET    Take 20 mEq by mouth at bedtime.    PROBIOTIC PRODUCT (PROBIOTIC DAILY PO)    Take 1 capsule by mouth daily. Digestive Advantage Probiotic   SPIRONOLACTONE (ALDACTONE) 100 MG TABLET    Take 1 tablet (100 mg total) by mouth 2 (two) times daily.  Modified Medications   No medications on file    Discontinued Medications   BENZONATATE (TESSALON) 100 MG CAPSULE    Take 1 capsule (100 mg total) by mouth every 8 (eight) hours.    Review of Systems  Constitutional: Negative.   HENT: Positive for hearing loss and postnasal drip.   Eyes: Negative.   Respiratory: Negative.   Cardiovascular: Positive for chest pain. Negative for palpitations and leg swelling.  Gastrointestinal:       Occasional heartburn  Endocrine:       Diabetic  Musculoskeletal: Positive for arthralgias, back pain and myalgias. Negative for gait problem and joint swelling.  Skin:       Complains of thinning hair  Neurological:       Distal paresthesias of tingling and burning in the feet and lower legs  Hematological: Negative.   Psychiatric/Behavioral: Positive for sleep disturbance (insomnia).    Vitals:   06/21/16 1555  BP: 116/74  Pulse: (!) 58  Resp: 16  Temp: 98.4 F (36.9 C)  TempSrc: Oral  SpO2: 98%  Weight: 133 lb 12.8 oz (60.7 kg)  Height: _0  (1.6 m)   Body mass index is 23.7 kg/m. Wt Readings from Last 3 Encounters:  06/21/16 133 lb 12.8 oz (60.7 kg)  05/18/16 140 lb (63.5 kg)  05/09/16 142 lb 13.7 oz (64.8 kg)      Physical Exam  Constitutional: She is oriented to person, place, and time. She appears well-developed and well-nourished. No distress.  HENT:  Head: Normocephalic and atraumatic.  Right Ear: External ear normal.  Left Ear: External ear normal.  Nose: Nose normal.  Mouth/Throat: Oropharynx is clear and moist. No oropharyngeal exudate.  Bilateral hearing aids  Eyes: Conjunctivae and EOM are normal. Pupils are equal, round, and reactive to light. No scleral icterus.  Neck: Neck supple. No JVD present. No tracheal deviation present. No thyromegaly present.  Cardiovascular: Normal rate, regular rhythm, normal heart sounds and intact distal pulses.  Exam reveals no gallop and no friction rub.   No murmur heard. Pulmonary/Chest: Effort normal and breath sounds normal.  No respiratory distress. She has no wheezes. She has no rales. She exhibits no tenderness.  Abdominal: Bowel sounds are normal. She exhibits no distension and no mass. There is no tenderness.  Mild abdominal distention.  Musculoskeletal: Normal range of motion. She exhibits no edema or  tenderness.  Patient has evidence of  rupture long head of the biceps proximally.in the right mid upper arm. It is not tender. Previous bruising is resolved. The muscle is balled up in the mid right upper arm. Tender to percussion in the lower back. Plan left greater trochanter to palpation.  Lymphadenopathy:    She has no cervical adenopathy.  Neurological: She is alert and oriented to person, place, and time. She has normal reflexes. No cranial nerve deficit. Coordination normal.  12/112/17 MMSE 29/30. Passed clock drawing.  Skin: No rash noted. She is not diaphoretic. No erythema. No pallor.  Hair is quite thin over the scalp.  Psychiatric: She has a normal mood and affect. Her behavior is normal. Judgment and thought content normal.    Labs reviewed: Lab Summary Latest Ref Rng & Units 06/16/2016  Hemoglobin 11.7 - 15.5 g/dL 10.9(L)  Hematocrit 35.0 - 45.0 % 35.5  White count 3.8 - 10.8 K/uL 4.1  Platelet count 140 - 400 K/uL 143  Sodium 135 - 146 mmol/L 139  Potassium 3.5 - 5.3 mmol/L 4.3  Calcium 8.6 - 10.4 mg/dL 10.0  Phosphorus - (None)  Creatinine 0.50 - 0.99 mg/dL 1.02(H)  AST 10 - 35 U/L 45(H)  Alk Phos 33 - 130 U/L 62  Bilirubin 0.2 - 1.2 mg/dL 0.6  Glucose 65 - 99 mg/dL 108(H)  Cholesterol - (None)  HDL cholesterol - (None)  Triglycerides - (None)  LDL Direct - (None)  LDL Calc - (None)  Total protein 6.1 - 8.1 g/dL 7.1  Albumin 3.6 - 5.1 g/dL 3.5(L)  Some recent data might be hidden   Lab Results  Component Value Date   TSH 2.150 06/23/2014   Lab Results  Component Value Date   BUN 25 06/16/2016   BUN 19 05/13/2016   BUN 19 05/08/2016   Lab Results  Component Value Date    HGBA1C 7.8 (H) 06/16/2016   HGBA1C 8.0 (H) 04/15/2016   HGBA1C 8.4 (H) 01/28/2016    Assessment/Plan  1. Other iron deficiency anemia continue iron - CBC with Differential/Platelet; Future  2. Uncontrolled type 2 diabetes mellitus with other circulatory complication, with long-term current use of insulin (HCC) The current medical regimen is effective;  continue present plan and medications. - Comprehensive metabolic panel; Future  3. Essential hypertension The current medical regimen is effective;  continue present plan and medications. - Comprehensive metabolic panel; Future  4. Gastroesophageal reflux disease without esophagitis Asymptomatic. Continues to use Mobic on most days.  5. Cirrhosis of liver with ascites, unspecified hepatic cirrhosis type ([place]) Improved. I think related to liver injury at the time of her rhabdomyolysis due to statin drug for HLD - Comprehensive metabolic panel; Future

## 2016-06-22 ENCOUNTER — Ambulatory Visit (HOSPITAL_COMMUNITY): Payer: Medicare Other

## 2016-06-24 ENCOUNTER — Other Ambulatory Visit (INDEPENDENT_AMBULATORY_CARE_PROVIDER_SITE_OTHER): Payer: Medicare Other

## 2016-06-24 ENCOUNTER — Encounter: Payer: Self-pay | Admitting: Gastroenterology

## 2016-06-24 ENCOUNTER — Ambulatory Visit (INDEPENDENT_AMBULATORY_CARE_PROVIDER_SITE_OTHER): Payer: Medicare Other | Admitting: Gastroenterology

## 2016-06-24 ENCOUNTER — Ambulatory Visit (HOSPITAL_COMMUNITY): Payer: Medicare Other

## 2016-06-24 VITALS — BP 104/60 | HR 65 | Ht 63.0 in | Wt 132.0 lb

## 2016-06-24 DIAGNOSIS — Z23 Encounter for immunization: Secondary | ICD-10-CM | POA: Diagnosis not present

## 2016-06-24 DIAGNOSIS — K7460 Unspecified cirrhosis of liver: Secondary | ICD-10-CM | POA: Diagnosis not present

## 2016-06-24 DIAGNOSIS — I2510 Atherosclerotic heart disease of native coronary artery without angina pectoris: Secondary | ICD-10-CM | POA: Diagnosis not present

## 2016-06-24 LAB — COMPREHENSIVE METABOLIC PANEL
ALK PHOS: 66 U/L (ref 39–117)
ALT: 42 U/L — AB (ref 0–35)
AST: 52 U/L — ABNORMAL HIGH (ref 0–37)
Albumin: 3.8 g/dL (ref 3.5–5.2)
BILIRUBIN TOTAL: 0.6 mg/dL (ref 0.2–1.2)
BUN: 20 mg/dL (ref 6–23)
CO2: 29 mEq/L (ref 19–32)
Calcium: 10.5 mg/dL (ref 8.4–10.5)
Chloride: 100 mEq/L (ref 96–112)
Creatinine, Ser: 1.03 mg/dL (ref 0.40–1.20)
GFR: 56.38 mL/min — ABNORMAL LOW (ref >60.00)
GLUCOSE: 118 mg/dL — AB (ref 70–99)
Potassium: 4 mEq/L (ref 3.5–5.1)
SODIUM: 138 meq/L (ref 135–145)
TOTAL PROTEIN: 7.9 g/dL (ref 6.0–8.3)

## 2016-06-24 LAB — CBC WITH DIFFERENTIAL/PLATELET
BASOS ABS: 0.1 10*3/uL (ref 0.0–0.1)
Basophils Relative: 1 % (ref 0.0–3.0)
EOS ABS: 0.1 10*3/uL (ref 0.0–0.7)
Eosinophils Relative: 2 % (ref 0.0–5.0)
HCT: 36.5 % (ref 36.0–46.0)
Hemoglobin: 11.9 g/dL — ABNORMAL LOW (ref 12.0–15.0)
LYMPHS ABS: 1.4 10*3/uL (ref 0.7–4.0)
Lymphocytes Relative: 20 % (ref 12.0–46.0)
MCHC: 32.6 g/dL (ref 30.0–36.0)
MCV: 81.5 fl (ref 78.0–100.0)
MONO ABS: 0.8 10*3/uL (ref 0.1–1.0)
Monocytes Relative: 11.6 % (ref 3.0–12.0)
NEUTROS PCT: 65.4 % (ref 43.0–77.0)
Neutro Abs: 4.5 10*3/uL (ref 1.4–7.7)
Platelets: 159 10*3/uL (ref 150.0–400.0)
RBC: 4.47 Mil/uL (ref 3.87–5.11)
RDW: 23 % — ABNORMAL HIGH (ref 11.5–15.5)
WBC: 6.8 10*3/uL (ref 4.0–10.5)

## 2016-06-24 LAB — PROTIME-INR
INR: 1.1 ratio — ABNORMAL HIGH (ref 0.8–1.0)
Prothrombin Time: 11.1 s (ref 9.6–13.1)

## 2016-06-24 MED ORDER — NA SULFATE-K SULFATE-MG SULF 17.5-3.13-1.6 GM/177ML PO SOLN: 1.0000 | Freq: Once | ORAL | 0 refills | Status: AC

## 2016-06-24 NOTE — Progress Notes (Signed)
Review of pertinent gastrointestinal problems: 1. Personal history of adenomatous colon polyps. Colonoscopy Dr. [name] 2012 August was normal except for 2 subcentimeter polyps, one was a tubular adenoma. She was recommended to have recall colonoscopy at five-year interval. She had also had previous colonoscopies with Dr. Lajoyce Corners and found to have adenomas as well. 2. Cirrhosis: likely from Fatty liver  Paracentesis 03/2016 4 liters, elevated SAAG, no SBP, cytology neg for malignancy  Most recent imaging:   Most recent AFP:   Most recent EGD: none yet  Immunization for Hep A/B:    HPI: This is a  very pleasant 71 year old woman whom I last saw about 3 months ago for her initial evaluation for newly diagnosed cirrhosis  Chief complaint is cirrhosis  Since her last visit she had statin-induced rhabdomyolysis, associated with renal insufficiency and significant debility. That is improving clearly by labs and clinically.  She has had no significant abdominal pains, no nausea or vomiting. Her bowels are relatively normal. No overt GI bleeding  Weight is down 36 pounds since initial cirrhosis evaluation 3 months ago  ROS: complete GI ROS as described in HPI.  Constitutional:  No unintentional weight loss   Past Medical History:  Diagnosis Date  . Allergy   . Arthritis    neck  . Cataract    bilateral - MD monitoring cataracts  . CHF (congestive heart failure) ([place])   . Chronic kidney disease, stage I    DR OTTELIN  HX UTIS  . Cirrhosis ([place])   . Cramp of limb   . Diabetes mellitus   . Dysphagia, unspecified(787.20)   . Dysuria   . Epistaxis   . GERD (gastroesophageal reflux disease)   . Heart murmur    NO CARDIOLOGIST  DX FOR YEARS ASYMPTOMATIC  . Lumbago   . Neoplasm of uncertain behavior of skin   . Nonspecific elevation of levels of transaminase or lactic acid dehydrogenase (LDH)   . Osteoarthrosis, unspecified whether generalized or localized, unspecified site   . Other  and unspecified hyperlipidemia    diet controlled  . Pain in joint, shoulder region   . Paresthesias 04/01/2015  . Postablative ovarian failure   . Trochanteric bursitis of left hip 12/15/2015  . Type 2 diabetes mellitus without complication ([place])   . Unspecified essential hypertension    no meds    Past Surgical History:  Procedure Laterality Date  . CARDIAC CATHETERIZATION N/A 01/27/2016   Procedure: Left Heart Cath and Coronary Angiography;  Surgeon: Belva Crome, MD;  Location: [facility];  Service: Cardiovascular;  Laterality: N/A;  . COLONOSCOPY  2012   Mitsue Peery - polyp  . CORONARY ARTERY BYPASS GRAFT N/A 01/28/2016   Procedure: CORONARY ARTERY BYPASS GRAFTING (CABG) x 3 USING RIGHT LEG GREATER SAPHENOUS VEIN GRAFT;  Surgeon: Melrose Nakayama, MD;  Location: [facility];  Service: Open Heart Surgery;  Laterality: N/A;  . ENDOVEIN HARVEST OF GREATER SAPHENOUS VEIN Right 01/28/2016   Procedure: ENDOVEIN HARVEST OF GREATER SAPHENOUS VEIN;  Surgeon: Melrose Nakayama, MD;  Location: [facility];  Service: Open Heart Surgery;  Laterality: Right;  . MAXIMUM ACCESS (MAS)POSTERIOR LUMBAR INTERBODY FUSION (PLIF) 1 LEVEL Left 06/10/2015   Procedure: FOR MAXIMUM ACCESS (MAS) POSTERIOR LUMBAR INTERBODY FUSION (PLIF) LUMBAR THREE-FOUR EXTRAFORAMINAL MICRODISCECTOMY LUMBAR FIVE-SACRAL ONE LEFT;  Surgeon: Eustace Moore, MD;  Location: [facility];  Service: Neurosurgery;  Laterality: Left;  . TEE WITHOUT CARDIOVERSION N/A 01/28/2016   Procedure: TRANSESOPHAGEAL ECHOCARDIOGRAM (TEE);  Surgeon: Remo Lipps  Chaya Jan, MD;  Location: [facility];  Service: Open Heart Surgery;  Laterality: N/A;  . TUBAL LIGATION  1982   Dr Connye Burkitt  . UPPER GASTROINTESTINAL ENDOSCOPY    . VAGINAL HYSTERECTOMY  1997   Dr Rande Lawman    Current Outpatient Prescriptions  Medication Sig Dispense Refill  . acetaminophen (TYLENOL) 325 MG tablet Take 2 tablets (650 mg total) by mouth every 6 (six) hours as needed for mild pain.    .  Aromatic Inhalants (VICKS VAPOR IN) Vicks Vapor Rub apply small amount to outside of nose to help breathing    . aspirin EC 325 MG EC tablet Take 1 tablet (325 mg total) by mouth daily. 30 tablet 0  . Biotin 10000 MCG TABS Take 10,000 mcg by mouth every morning.    . calcium carbonate (OS-CAL) 600 MG TABS Take 600 mg by mouth 2 (two) times daily with a meal.      . cholecalciferol (VITAMIN D) 1000 UNITS tablet Take 1,000 Units by mouth daily.      . ciprofloxacin (CIPRO) 250 MG tablet Take 250 mg by mouth 2 (two) times daily as needed (to prevent UTIs). Take one tablet after intercourse to prevent urinary tract infection    . Cyanocobalamin (VITAMIN B 12 PO) Take 1,000 mcg by mouth daily.      [name] Kitchen dextromethorphan-guaiFENesin (MUCINEX DM) 30-600 MG 12hr tablet Take 1 tablet by mouth 2 (two) times daily as needed for cough.    . ferrous sulfate 325 (65 FE) MG tablet Take 1 tablet (325 mg total) by mouth 2 (two) times daily with a meal. 90 tablet 0  . fish oil-omega-3 fatty acids 1000 MG capsule Take 1 g by mouth daily.      . fluticasone (FLONASE) 50 MCG/ACT nasal spray Place 2 sprays into both nostrils daily as needed for allergies or rhinitis.    . furosemide (LASIX) 40 MG tablet Take 40 mg by mouth 2 (two) times daily.    . Glucosamine 500 MG CAPS Take 2,000 mg by mouth 2 (two) times daily.    . insulin detemir (LEVEMIR) 100 UNIT/ML injection Inject 0.45 mLs (45 Units total) into the skin at bedtime. 10 mL 2  . MAGNESIUM PO Take 500 mg by mouth 2 (two) times daily in the am and at bedtime..    . meloxicam (MOBIC) 15 MG tablet TAKE 1 TABLET BY MOUTH DAILY TO HELP ARTHRITIS AND BURSITIS 90 tablet 2  . metFORMIN (GLUCOPHAGE) 1000 MG tablet TAKE 1 TABLET BY MOUTH TWICE DAILY TO TREAT DIABETES 180 tablet 1  . metoprolol tartrate (LOPRESSOR) 25 MG tablet Take one and half tablets by mouth twice daily for blood pressure 90 tablet 3  . Multiple Vitamins-Minerals (MULTIVITAMIN WITH MINERALS) tablet Take 1  tablet by mouth daily.      [name] Kitchen omeprazole (PRILOSEC) 20 MG capsule TAKE ONE CAPSULE BY MOUTH DAILY 90 capsule 1  . oseltamivir (TAMIFLU) 75 MG capsule Take 1 capsule (75 mg total) by mouth every 12 (twelve) hours. 10 capsule 0  . Polyethyl Glycol-Propyl Glycol (SYSTANE OP) Place 1 drop into both eyes 2 (two) times daily.    . potassium chloride SA (K-DUR,KLOR-CON) 20 MEQ tablet Take 20 mEq by mouth at bedtime.     . Probiotic Product (PROBIOTIC DAILY PO) Take 1 capsule by mouth daily. Digestive Advantage Probiotic    . spironolactone (ALDACTONE) 100 MG tablet Take 1 tablet (100 mg total) by mouth 2 (two) times daily. 60 tablet 6  No current facility-administered medications for this visit.     Allergies as of 06/24/2016 - Review Complete 06/24/2016  Allergen Reaction Noted  . Kiwi extract Anaphylaxis 05/28/2015  . Tdap [diphth-acell pertussis-tetanus] Swelling and Other (See Comments) 03/27/2013  . Statins  05/08/2016  . Latex Itching, Dermatitis, and Rash 12/22/2010  . Tramadol Nausea And Vomiting 05/28/2015    Family History  Problem Relation Age of Onset  . Lung cancer Father   . Arthritis Sister   . Arthritis Brother   . Heart disease Maternal Grandmother   . Heart disease Maternal Grandfather   . Heart disease Paternal Grandmother   . Heart disease Paternal Grandfather   . Breast cancer Mother   . Liver cancer Brother   . Colon cancer Neg Hx   . Esophageal cancer Neg Hx   . Rectal cancer Neg Hx   . Stomach cancer Neg Hx     Social History   Social History  . Marital status: Married    Spouse name: N/A  . Number of children: 6  . Years of education: N/A   Occupational History  . retired    Social History Main Topics  . Smoking status: Never Smoker  . Smokeless tobacco: Never Used  . Alcohol use No  . Drug use: No  . Sexual activity: Yes    Partners: Male    Birth control/ protection: Post-menopausal, Surgical     Comment: Hysterectomy   Other Topics  Concern  . Not on file   Social History Narrative  . No narrative on file     Physical Exam: BP 104/60   Pulse 65   Ht 5' 3"  (1.6 m)   Wt 132 lb (59.9 kg)   BMI 23.38 kg/m  Constitutional: Chronically ill-appearing Psychiatric: alert and oriented x3 Abdomen: soft, nontender, nondistended, no obvious ascites, no peritoneal signs, normal bowel sounds Trace peripheral edema noted in lower extremities  Assessment and plan: 71 y.o. female with cirrhosis, likely due to fatty liver  She has lost 36 pounds since her initial visit here. That is over about 3 months. I'm going to decrease her diuretics by one half each. She will get a basic set of labs today as well including CBC, coags, complete about profile.  We will begin immunizing her for hepatitis A and B. She needs colonoscopy for polyp surveillance and upper endoscopy for variceal screening. I recommended she continue to follow her weights daily and call here if she notices significant increase in her weight or edema over the next couple weeks since I am decreasing her water pills. Otherwise she'll return to see me in about 3 months.  Please see the "Patient Instructions" section for addition details about the plan.  Owens Loffler, MD [facility] 06/24/2016, 9:34 AM

## 2016-06-24 NOTE — Patient Instructions (Addendum)
You will have labs checked today in the basement lab.  Please head down after you check out with the front desk  ( cbc, cmet, inr). Immunization for Hep A/B series (starting today) You will be set up for an upper endoscopy to screen for varices You will be set up for a colonoscopy for polyp follow up ([place]) Will decrease your lasix to 55m pill ONCE daily.   Will decrease your aldactone to 1082mpill ONCE daily. Follow your weights. Please return to see Dr. JaArdis Hughsn 3 months.  It is important that you have a relatively low salt diet.  High salt diet can cause fluid to accumulate in your legs, abdomen and even around your lungs. You should try to avoid NSAID type over the counter pain medicines as best as possible. Tylenol is safe to take for 'routine' aches and pains, but never take more than 1/2 the dose suggested on the package instructions (never more than 2 grams per day). Avoid alcohol.

## 2016-06-27 ENCOUNTER — Ambulatory Visit (HOSPITAL_COMMUNITY): Payer: Medicare Other

## 2016-06-28 ENCOUNTER — Other Ambulatory Visit: Payer: Self-pay | Admitting: Internal Medicine

## 2016-06-29 ENCOUNTER — Encounter: Payer: Self-pay | Admitting: Cardiovascular Disease

## 2016-06-29 ENCOUNTER — Ambulatory Visit (INDEPENDENT_AMBULATORY_CARE_PROVIDER_SITE_OTHER): Payer: Medicare Other | Admitting: Cardiovascular Disease

## 2016-06-29 ENCOUNTER — Ambulatory Visit (HOSPITAL_COMMUNITY): Payer: Medicare Other

## 2016-06-29 VITALS — BP 90/50 | HR 64 | Ht 63.0 in | Wt 135.1 lb

## 2016-06-29 DIAGNOSIS — I2510 Atherosclerotic heart disease of native coronary artery without angina pectoris: Secondary | ICD-10-CM | POA: Diagnosis not present

## 2016-06-29 NOTE — Patient Instructions (Signed)

## 2016-07-01 ENCOUNTER — Ambulatory Visit (HOSPITAL_COMMUNITY): Payer: Medicare Other

## 2016-07-04 ENCOUNTER — Other Ambulatory Visit: Payer: Self-pay

## 2016-07-04 ENCOUNTER — Ambulatory Visit (INDEPENDENT_AMBULATORY_CARE_PROVIDER_SITE_OTHER): Payer: Medicare Other | Admitting: Gastroenterology

## 2016-07-04 ENCOUNTER — Ambulatory Visit (HOSPITAL_COMMUNITY): Payer: Medicare Other

## 2016-07-04 DIAGNOSIS — Z23 Encounter for immunization: Secondary | ICD-10-CM | POA: Diagnosis not present

## 2016-07-04 DIAGNOSIS — K7460 Unspecified cirrhosis of liver: Secondary | ICD-10-CM

## 2016-07-06 ENCOUNTER — Ambulatory Visit (HOSPITAL_COMMUNITY): Payer: Medicare Other

## 2016-07-06 ENCOUNTER — Ambulatory Visit: Payer: Medicare Other | Admitting: Cardiovascular Disease

## 2016-07-07 ENCOUNTER — Ambulatory Visit (HOSPITAL_COMMUNITY): Payer: Medicare Other | Admitting: Anesthesiology

## 2016-07-07 ENCOUNTER — Other Ambulatory Visit: Payer: Medicare Other

## 2016-07-07 ENCOUNTER — Encounter (HOSPITAL_COMMUNITY)
Admission: RE | Disposition: A | Discharge status: Home / Self Care | Payer: Self-pay | Source: Ambulatory Visit | Attending: Gastroenterology

## 2016-07-07 ENCOUNTER — Ambulatory Visit (HOSPITAL_COMMUNITY)
Admission: RE | Admit: 2016-07-07 | Discharge: 2016-07-07 | Disposition: A | Discharge status: Home / Self Care | Payer: Medicare Other | Source: Ambulatory Visit | Attending: Gastroenterology | Admitting: Gastroenterology

## 2016-07-07 ENCOUNTER — Encounter (HOSPITAL_COMMUNITY): Payer: Self-pay

## 2016-07-07 DIAGNOSIS — I509 Heart failure, unspecified: Secondary | ICD-10-CM | POA: Diagnosis not present

## 2016-07-07 DIAGNOSIS — E785 Hyperlipidemia, unspecified: Secondary | ICD-10-CM | POA: Insufficient documentation

## 2016-07-07 DIAGNOSIS — Z1211 Encounter for screening for malignant neoplasm of colon: Secondary | ICD-10-CM | POA: Insufficient documentation

## 2016-07-07 DIAGNOSIS — Z79899 Other long term (current) drug therapy: Secondary | ICD-10-CM | POA: Diagnosis not present

## 2016-07-07 DIAGNOSIS — Z86010 Personal history of colonic polyps: Secondary | ICD-10-CM

## 2016-07-07 DIAGNOSIS — R1310 Dysphagia, unspecified: Secondary | ICD-10-CM | POA: Insufficient documentation

## 2016-07-07 DIAGNOSIS — K7460 Unspecified cirrhosis of liver: Secondary | ICD-10-CM

## 2016-07-07 DIAGNOSIS — K219 Gastro-esophageal reflux disease without esophagitis: Secondary | ICD-10-CM | POA: Insufficient documentation

## 2016-07-07 DIAGNOSIS — I8500 Esophageal varices without bleeding: Secondary | ICD-10-CM

## 2016-07-07 DIAGNOSIS — I2510 Atherosclerotic heart disease of native coronary artery without angina pectoris: Secondary | ICD-10-CM | POA: Insufficient documentation

## 2016-07-07 DIAGNOSIS — K766 Portal hypertension: Secondary | ICD-10-CM | POA: Insufficient documentation

## 2016-07-07 DIAGNOSIS — I110 Hypertensive heart disease with heart failure: Secondary | ICD-10-CM | POA: Insufficient documentation

## 2016-07-07 DIAGNOSIS — K3189 Other diseases of stomach and duodenum: Secondary | ICD-10-CM | POA: Diagnosis not present

## 2016-07-07 DIAGNOSIS — D128 Benign neoplasm of rectum: Secondary | ICD-10-CM

## 2016-07-07 DIAGNOSIS — Z7982 Long term (current) use of aspirin: Secondary | ICD-10-CM | POA: Diagnosis not present

## 2016-07-07 DIAGNOSIS — M1990 Unspecified osteoarthritis, unspecified site: Secondary | ICD-10-CM | POA: Insufficient documentation

## 2016-07-07 DIAGNOSIS — N181 Chronic kidney disease, stage 1: Secondary | ICD-10-CM | POA: Diagnosis not present

## 2016-07-07 DIAGNOSIS — E1122 Type 2 diabetes mellitus with diabetic chronic kidney disease: Secondary | ICD-10-CM | POA: Diagnosis not present

## 2016-07-07 DIAGNOSIS — K621 Rectal polyp: Secondary | ICD-10-CM | POA: Diagnosis not present

## 2016-07-07 DIAGNOSIS — I130 Hypertensive heart and chronic kidney disease with heart failure and stage 1 through stage 4 chronic kidney disease, or unspecified chronic kidney disease: Secondary | ICD-10-CM | POA: Diagnosis not present

## 2016-07-07 DIAGNOSIS — Z794 Long term (current) use of insulin: Secondary | ICD-10-CM | POA: Diagnosis not present

## 2016-07-07 DIAGNOSIS — Z951 Presence of aortocoronary bypass graft: Secondary | ICD-10-CM | POA: Insufficient documentation

## 2016-07-07 HISTORY — PX: ESOPHAGOGASTRODUODENOSCOPY (EGD) WITH PROPOFOL: SHX5813

## 2016-07-07 HISTORY — PX: COLONOSCOPY WITH PROPOFOL: SHX5780

## 2016-07-07 LAB — GLUCOSE, CAPILLARY: Glucose-Capillary: 96 mg/dL (ref 65–99)

## 2016-07-07 LAB — HM COLONOSCOPY

## 2016-07-07 SURGERY — ESOPHAGOGASTRODUODENOSCOPY (EGD) WITH PROPOFOL
Anesthesia: Monitor Anesthesia Care

## 2016-07-07 MED ORDER — PROPOFOL 10 MG/ML IV BOLUS
INTRAVENOUS | Status: DC | PRN
  Administered 2016-07-07: 10 mg via INTRAVENOUS
  Administered 2016-07-07 (×2): 20 mg via INTRAVENOUS
  Administered 2016-07-07: 10 mg via INTRAVENOUS

## 2016-07-07 MED ORDER — LIDOCAINE 2% (20 MG/ML) 5 ML SYRINGE
INTRAMUSCULAR | Status: DC | PRN
  Administered 2016-07-07: 60 mg via INTRAVENOUS

## 2016-07-07 MED ORDER — PROPOFOL 500 MG/50ML IV EMUL
INTRAVENOUS | Status: DC | PRN
  Administered 2016-07-07: 100 ug/kg/min via INTRAVENOUS

## 2016-07-07 MED ORDER — SODIUM CHLORIDE 0.9 % IV SOLN: INTRAVENOUS | Status: DC

## 2016-07-07 MED ORDER — LIDOCAINE 2% (20 MG/ML) 5 ML SYRINGE
INTRAMUSCULAR | Status: AC
  Filled 2016-07-07: qty 5

## 2016-07-07 MED ORDER — PROPOFOL 10 MG/ML IV BOLUS
INTRAVENOUS | Status: AC
  Filled 2016-07-07: qty 60

## 2016-07-07 MED ORDER — LACTATED RINGERS IV SOLN
INTRAVENOUS | Status: DC
Start: 2016-07-07 — End: 2016-07-07
  Administered 2016-07-07: 1000 mL via INTRAVENOUS

## 2016-07-07 SURGICAL SUPPLY — 24 items

## 2016-07-07 NOTE — Anesthesia Postprocedure Evaluation (Signed)
Anesthesia Post Note  Patient: [name]  Procedure(s) Performed: Procedure(s) (LRB): ESOPHAGOGASTRODUODENOSCOPY (EGD) WITH PROPOFOL (N/A) COLONOSCOPY WITH PROPOFOL (N/A)  Patient location during evaluation: Endoscopy Anesthesia Type: MAC Level of consciousness: awake and alert Pain management: pain level controlled Vital Signs Assessment: post-procedure vital signs reviewed and stable Respiratory status: spontaneous breathing, nonlabored ventilation, respiratory function stable and patient connected to nasal cannula oxygen Cardiovascular status: stable and blood pressure returned to baseline Anesthetic complications: no       Last Vitals:  Vitals:   07/07/16 0934 07/07/16 1126  BP: (!) 126/47 (!) 101/42  Pulse: 67 66  Resp: 16 16  Temp: 36.7 C 36.5 C    Last Pain:  Vitals:   07/07/16 1126  TempSrc: Oral                 Montez Hageman

## 2016-07-07 NOTE — Transfer of Care (Signed)
Immediate Anesthesia Transfer of Care Note  Patient: [name]  Procedure(s) Performed: Procedure(s): ESOPHAGOGASTRODUODENOSCOPY (EGD) WITH PROPOFOL (N/A) COLONOSCOPY WITH PROPOFOL (N/A)  Patient Location: PACU and Endoscopy Unit  Anesthesia Type:MAC  Level of Consciousness: awake and patient cooperative  Airway & Oxygen Therapy: Patient Spontanous Breathing and Patient connected to nasal cannula oxygen  Post-op Assessment: Report given to RN and Post -op Vital signs reviewed and stable  Post vital signs: Reviewed and stable  Last Vitals:  Vitals:   07/07/16 0934  BP: (!) 126/47  Pulse: 67  Resp: 16  Temp: 36.7 C    Last Pain:  Vitals:   07/07/16 0934  TempSrc: Oral         Complications: No apparent anesthesia complications

## 2016-07-07 NOTE — Anesthesia Procedure Notes (Signed)
Procedure Name: MAC Date/Time: 07/07/2016 10:50 AM Performed by: Dione Booze Pre-anesthesia Checklist: Patient identified, Emergency Drugs available, Patient being monitored and Suction available Patient Re-evaluated:Patient Re-evaluated prior to inductionOxygen Delivery Method: Nasal cannula Placement Confirmation: positive ETCO2

## 2016-07-07 NOTE — Anesthesia Preprocedure Evaluation (Signed)
Anesthesia Evaluation  Patient identified by MRN, date of birth, ID band Patient awake    Reviewed: Allergy & Precautions, NPO status , Patient's Chart, lab work & pertinent test results  Airway Mallampati: II  TM Distance: >3 FB Neck ROM: Full    Dental no notable dental hx.    Pulmonary neg pulmonary ROS,    Pulmonary exam normal breath sounds clear to auscultation       Cardiovascular hypertension, + CAD, + CABG (2017) and +CHF  Normal cardiovascular exam Rhythm:Regular Rate:Normal     Neuro/Psych negative neurological ROS  negative psych ROS   GI/Hepatic negative GI ROS, (+) Cirrhosis       ,   Endo/Other  diabetes, Type 2, Insulin Dependent, Oral Hypoglycemic Agents  Renal/GU negative Renal ROS  negative genitourinary   Musculoskeletal negative musculoskeletal ROS (+)   Abdominal   Peds negative pediatric ROS (+)  Hematology negative hematology ROS (+)   Anesthesia Other Findings   Reproductive/Obstetrics negative OB ROS                             Anesthesia Physical Anesthesia Plan  ASA: III  Anesthesia Plan: MAC   Post-op Pain Management:    Induction:   Airway Management Planned: Nasal Cannula  Additional Equipment:   Intra-op Plan:   Post-operative Plan:   Informed Consent: I have reviewed the patients History and Physical, chart, labs and discussed the procedure including the risks, benefits and alternatives for the proposed anesthesia with the patient or authorized representative who has indicated his/her understanding and acceptance.   Dental advisory given  Plan Discussed with: CRNA  Anesthesia Plan Comments:         Anesthesia Quick Evaluation

## 2016-07-07 NOTE — H&P (View-Only) (Signed)
Review of pertinent gastrointestinal problems: 1. Personal history of adenomatous colon polyps. Colonoscopy Dr. [name] 2012 August was normal except for 2 subcentimeter polyps, one was a tubular adenoma. She was recommended to have recall colonoscopy at five-year interval. She had also had previous colonoscopies with Dr. Lajoyce Corners and found to have adenomas as well. 2. Cirrhosis: likely from Fatty liver  Paracentesis 03/2016 4 liters, elevated SAAG, no SBP, cytology neg for malignancy  Most recent imaging:   Most recent AFP:   Most recent EGD: none yet  Immunization for Hep A/B:    HPI: This is a  very pleasant 71 year old woman whom I last saw about 3 months ago for her initial evaluation for newly diagnosed cirrhosis  Chief complaint is cirrhosis  Since her last visit she had statin-induced rhabdomyolysis, associated with renal insufficiency and significant debility. That is improving clearly by labs and clinically.  She has had no significant abdominal pains, no nausea or vomiting. Her bowels are relatively normal. No overt GI bleeding  Weight is down 36 pounds since initial cirrhosis evaluation 3 months ago  ROS: complete GI ROS as described in HPI.  Constitutional:  No unintentional weight loss   Past Medical History:  Diagnosis Date  . Allergy   . Arthritis    neck  . Cataract    bilateral - MD monitoring cataracts  . CHF (congestive heart failure) ([place])   . Chronic kidney disease, stage I    DR OTTELIN  HX UTIS  . Cirrhosis ([place])   . Cramp of limb   . Diabetes mellitus   . Dysphagia, unspecified(787.20)   . Dysuria   . Epistaxis   . GERD (gastroesophageal reflux disease)   . Heart murmur    NO CARDIOLOGIST  DX FOR YEARS ASYMPTOMATIC  . Lumbago   . Neoplasm of uncertain behavior of skin   . Nonspecific elevation of levels of transaminase or lactic acid dehydrogenase (LDH)   . Osteoarthrosis, unspecified whether generalized or localized, unspecified site   . Other  and unspecified hyperlipidemia    diet controlled  . Pain in joint, shoulder region   . Paresthesias 04/01/2015  . Postablative ovarian failure   . Trochanteric bursitis of left hip 12/15/2015  . Type 2 diabetes mellitus without complication ([place])   . Unspecified essential hypertension    no meds    Past Surgical History:  Procedure Laterality Date  . CARDIAC CATHETERIZATION N/A 01/27/2016   Procedure: Left Heart Cath and Coronary Angiography;  Surgeon: Belva Crome, MD;  Location: [facility];  Service: Cardiovascular;  Laterality: N/A;  . COLONOSCOPY  2012   [facility] - polyp  . CORONARY ARTERY BYPASS GRAFT N/A 01/28/2016   Procedure: CORONARY ARTERY BYPASS GRAFTING (CABG) x 3 USING RIGHT LEG GREATER SAPHENOUS VEIN GRAFT;  Surgeon: Melrose Nakayama, MD;  Location: [facility];  Service: Open Heart Surgery;  Laterality: N/A;  . ENDOVEIN HARVEST OF GREATER SAPHENOUS VEIN Right 01/28/2016   Procedure: ENDOVEIN HARVEST OF GREATER SAPHENOUS VEIN;  Surgeon: Melrose Nakayama, MD;  Location: [facility];  Service: Open Heart Surgery;  Laterality: Right;  . MAXIMUM ACCESS (MAS)POSTERIOR LUMBAR INTERBODY FUSION (PLIF) 1 LEVEL Left 06/10/2015   Procedure: FOR MAXIMUM ACCESS (MAS) POSTERIOR LUMBAR INTERBODY FUSION (PLIF) LUMBAR THREE-FOUR EXTRAFORAMINAL MICRODISCECTOMY LUMBAR FIVE-SACRAL ONE LEFT;  Surgeon: Eustace Moore, MD;  Location: [facility];  Service: Neurosurgery;  Laterality: Left;  . TEE WITHOUT CARDIOVERSION N/A 01/28/2016   Procedure: TRANSESOPHAGEAL ECHOCARDIOGRAM (TEE);  Surgeon: Remo Lipps  Chaya Jan, MD;  Location: [place];  Service: Open Heart Surgery;  Laterality: N/A;  . TUBAL LIGATION  1982   Dr Connye Burkitt  . UPPER GASTROINTESTINAL ENDOSCOPY    . VAGINAL HYSTERECTOMY  1997   Dr Rande Lawman    Current Outpatient Prescriptions  Medication Sig Dispense Refill  . acetaminophen (TYLENOL) 325 MG tablet Take 2 tablets (650 mg total) by mouth every 6 (six) hours as needed for mild pain.    .  Aromatic Inhalants (VICKS VAPOR IN) Vicks Vapor Rub apply small amount to outside of nose to help breathing    . aspirin EC 325 MG EC tablet Take 1 tablet (325 mg total) by mouth daily. 30 tablet 0  . Biotin 10000 MCG TABS Take 10,000 mcg by mouth every morning.    . calcium carbonate (OS-CAL) 600 MG TABS Take 600 mg by mouth 2 (two) times daily with a meal.      . cholecalciferol (VITAMIN D) 1000 UNITS tablet Take 1,000 Units by mouth daily.      . ciprofloxacin (CIPRO) 250 MG tablet Take 250 mg by mouth 2 (two) times daily as needed (to prevent UTIs). Take one tablet after intercourse to prevent urinary tract infection    . Cyanocobalamin (VITAMIN B 12 PO) Take 1,000 mcg by mouth daily.      [name] Kitchen dextromethorphan-guaiFENesin (MUCINEX DM) 30-600 MG 12hr tablet Take 1 tablet by mouth 2 (two) times daily as needed for cough.    . ferrous sulfate 325 (65 FE) MG tablet Take 1 tablet (325 mg total) by mouth 2 (two) times daily with a meal. 90 tablet 0  . fish oil-omega-3 fatty acids 1000 MG capsule Take 1 g by mouth daily.      . fluticasone (FLONASE) 50 MCG/ACT nasal spray Place 2 sprays into both nostrils daily as needed for allergies or rhinitis.    . furosemide (LASIX) 40 MG tablet Take 40 mg by mouth 2 (two) times daily.    . Glucosamine 500 MG CAPS Take 2,000 mg by mouth 2 (two) times daily.    . insulin detemir (LEVEMIR) 100 UNIT/ML injection Inject 0.45 mLs (45 Units total) into the skin at bedtime. 10 mL 2  . MAGNESIUM PO Take 500 mg by mouth 2 (two) times daily in the am and at bedtime..    . meloxicam (MOBIC) 15 MG tablet TAKE 1 TABLET BY MOUTH DAILY TO HELP ARTHRITIS AND BURSITIS 90 tablet 2  . metFORMIN (GLUCOPHAGE) 1000 MG tablet TAKE 1 TABLET BY MOUTH TWICE DAILY TO TREAT DIABETES 180 tablet 1  . metoprolol tartrate (LOPRESSOR) 25 MG tablet Take one and half tablets by mouth twice daily for blood pressure 90 tablet 3  . Multiple Vitamins-Minerals (MULTIVITAMIN WITH MINERALS) tablet Take 1  tablet by mouth daily.      [name] Kitchen omeprazole (PRILOSEC) 20 MG capsule TAKE ONE CAPSULE BY MOUTH DAILY 90 capsule 1  . oseltamivir (TAMIFLU) 75 MG capsule Take 1 capsule (75 mg total) by mouth every 12 (twelve) hours. 10 capsule 0  . Polyethyl Glycol-Propyl Glycol (SYSTANE OP) Place 1 drop into both eyes 2 (two) times daily.    . potassium chloride SA (K-DUR,KLOR-CON) 20 MEQ tablet Take 20 mEq by mouth at bedtime.     . Probiotic Product (PROBIOTIC DAILY PO) Take 1 capsule by mouth daily. Digestive Advantage Probiotic    . spironolactone (ALDACTONE) 100 MG tablet Take 1 tablet (100 mg total) by mouth 2 (two) times daily. 60 tablet 6  No current facility-administered medications for this visit.     Allergies as of 06/24/2016 - Review Complete 06/24/2016  Allergen Reaction Noted  . Kiwi extract Anaphylaxis 05/28/2015  . Tdap [diphth-acell pertussis-tetanus] Swelling and Other (See Comments) 03/27/2013  . Statins  05/08/2016  . Latex Itching, Dermatitis, and Rash 12/22/2010  . Tramadol Nausea And Vomiting 05/28/2015    Family History  Problem Relation Age of Onset  . Lung cancer Father   . Arthritis Sister   . Arthritis Brother   . Heart disease Maternal Grandmother   . Heart disease Maternal Grandfather   . Heart disease Paternal Grandmother   . Heart disease Paternal Grandfather   . Breast cancer Mother   . Liver cancer Brother   . Colon cancer Neg Hx   . Esophageal cancer Neg Hx   . Rectal cancer Neg Hx   . Stomach cancer Neg Hx     Social History   Social History  . Marital status: Married    Spouse name: N/A  . Number of children: 6  . Years of education: N/A   Occupational History  . retired    Social History Main Topics  . Smoking status: Never Smoker  . Smokeless tobacco: Never Used  . Alcohol use No  . Drug use: No  . Sexual activity: Yes    Partners: Male    Birth control/ protection: Post-menopausal, Surgical     Comment: Hysterectomy   Other Topics  Concern  . Not on file   Social History Narrative  . No narrative on file     Physical Exam: BP 104/60   Pulse 65   Ht 5' 3"  (1.6 m)   Wt 132 lb (59.9 kg)   BMI 23.38 kg/m  Constitutional: Chronically ill-appearing Psychiatric: alert and oriented x3 Abdomen: soft, nontender, nondistended, no obvious ascites, no peritoneal signs, normal bowel sounds Trace peripheral edema noted in lower extremities  Assessment and plan: 71 y.o. female with cirrhosis, likely due to fatty liver  She has lost 36 pounds since her initial visit here. That is over about 3 months. I'm going to decrease her diuretics by one half each. She will get a basic set of labs today as well including CBC, coags, complete about profile.  We will begin immunizing her for hepatitis A and B. She needs colonoscopy for polyp surveillance and upper endoscopy for variceal screening. I recommended she continue to follow her weights daily and call here if she notices significant increase in her weight or edema over the next couple weeks since I am decreasing her water pills. Otherwise she'll return to see me in about 3 months.  Please see the "Patient Instructions" section for addition details about the plan.  Owens Loffler, MD [facility] 06/24/2016, 9:34 AM

## 2016-07-07 NOTE — Op Note (Signed)
[facility] Patient Name: [name] Procedure Date: 07/07/2016 MRN: [id number] Attending MD: [name] , MD Date of Birth: Oct 05, 1945 CSN: [id number] Age: 71 Admit Type: Outpatient Procedure:                Colonoscopy Indications:              High risk colon cancer surveillance: Personal                            history of colonic polyps; colonoscopy Dr. [name]                            2012 with two subCM adenomas Providers:                [name], MD, Elmer Ramp. Tilden Dome, RN, Cletis Athens, Technician Referring MD:              Medicines:                Monitored Anesthesia Care Complications:            No immediate complications. Estimated blood loss:                            Minimal. Estimated Blood Loss:     Estimated blood loss: none. Procedure:                Pre-Anesthesia Assessment:                           - Prior to the procedure, a History and Physical                            was performed, and patient medications and                            allergies were reviewed. The patient's tolerance of                            previous anesthesia was also reviewed. The risks                            and benefits of the procedure and the sedation                            options and risks were discussed with the patient.                            All questions were answered, and informed consent                            was obtained. Prior Anticoagulants: The patient has                            taken no  previous anticoagulant or antiplatelet                            agents. ASA Grade Assessment: III - A patient with                            severe systemic disease. After reviewing the risks                            and benefits, the patient was deemed in                            satisfactory condition to undergo the procedure.                           After obtaining informed consent, the  colonoscope                            was passed under direct vision. Throughout the                            procedure, the patient's blood pressure, pulse, and                            oxygen saturations were monitored continuously. The                            [id number] ([id number]) scope was introduced through                            the anus and advanced to the the cecum, identified                            by appendiceal orifice and ileocecal valve. The                            colonoscopy was performed without difficulty. The                            patient tolerated the procedure well. The quality                            of the bowel preparation was good. The ileocecal                            valve, appendiceal orifice, and rectum were                            photographed. Scope In: 10:55:10 AM Scope Out: 19:41:74 AM Scope Withdrawal Time: 0 hours 14 minutes 47 seconds  Total Procedure Duration: 0 hours 21 minutes 4 seconds  Findings:      A 4 mm polyp was found in the rectum. The polyp was sessile. The polyp       was removed with a  cold snare. Resection and retrieval were complete.      The exam was otherwise without abnormality on direct and retroflexion       views. Impression:               - One 4 mm polyp in the rectum, removed with a cold                            snare. Resected and retrieved.                           - The examination was otherwise normal on direct                            and retroflexion views. Moderate Sedation:      N/A- Per Anesthesia Care Recommendation:           - Patient has a contact number available for                            emergencies. The signs and symptoms of potential                            delayed complications were discussed with the                            patient. Return to normal activities tomorrow.                            Written discharge instructions were provided to the                             patient.                           - Resume previous diet.                           - Continue present medications.                           You will receive a letter within 2-3 weeks with the                            pathology results and my final recommendations.                           If the polyp(s) is proven to be 'pre-cancerous' on                            pathology, you will need repeat colonoscopy in 5                            years. Procedure Code(s):        --- Professional ---  45385, Colonoscopy, flexible; with removal of                            tumor(s), polyp(s), or other lesion(s) by snare                            technique Diagnosis Code(s):        --- Professional ---                           Z86.010, Personal history of colonic polyps                           K62.1, Rectal polyp CPT copyright 2016 [employer] Association. All rights reserved. The codes documented in this report are preliminary and upon coder review may  be revised to meet current compliance requirements. [name], MD 07/07/2016 11:21:53 AM This report has been signed electronically. Number of Addenda: 0

## 2016-07-07 NOTE — Interval H&P Note (Signed)
History and Physical Interval Note:  07/07/2016 10:35 AM  [name]  has presented today for surgery, with the diagnosis of screen for varicies, hx of polyps   The various methods of treatment have been discussed with the patient and family. After consideration of risks, benefits and other options for treatment, the patient has consented to  Procedure(s): ESOPHAGOGASTRODUODENOSCOPY (EGD) WITH PROPOFOL (N/A) COLONOSCOPY WITH PROPOFOL (N/A) as a surgical intervention .  The patient's history has been reviewed, patient examined, no change in status, stable for surgery.  I have reviewed the patient's chart and labs.  Questions were answered to the patient's satisfaction.     [name]

## 2016-07-07 NOTE — Op Note (Signed)
[facility] Patient Name: [name] Procedure Date: 07/07/2016 MRN: [id number] Attending MD: [name] , MD Date of Birth: Nov 22, 1945 CSN: [id number] Age: 71 Admit Type: Outpatient Procedure:                Upper GI endoscopy Indications:              Screening procedure; cirrhosis, variceal screening Providers:                [name], MD, Tory Emerald, RN, Cletis Athens, Technician Referring MD:              Medicines:                Monitored Anesthesia Care Complications:            No immediate complications. Estimated blood loss:                            None. Estimated Blood Loss:     Estimated blood loss: none. Procedure:                Pre-Anesthesia Assessment:                           - Prior to the procedure, a History and Physical                            was performed, and patient medications and                            allergies were reviewed. The patient's tolerance of                            previous anesthesia was also reviewed. The risks                            and benefits of the procedure and the sedation                            options and risks were discussed with the patient.                            All questions were answered, and informed consent                            was obtained. Prior Anticoagulants: The patient has                            taken no previous anticoagulant or antiplatelet                            agents. ASA Grade Assessment: III - A patient with  severe systemic disease. After reviewing the risks                            and benefits, the patient was deemed in                            satisfactory condition to undergo the procedure.                           After obtaining informed consent, the endoscope was                            passed under direct vision. Throughout the                            procedure, the  patient's blood pressure, pulse, and                            oxygen saturations were monitored continuously. The                            [id number] ([id number]) scope was introduced through the                            mouth, and advanced to the second part of duodenum.                            The upper GI endoscopy was accomplished without                            difficulty. The patient tolerated the procedure                            well. Scope In: Scope Out: Findings:      Three columns of non-bleeding large (> 5 mm) varices were found in the       lower third of the esophagus,. No stigmata of recent bleeding were       evident.      Mild portal hypertensive gastropathy was found in the entire examined       stomach.      The examined duodenum was normal. Impression:               - Non-bleeding large (> 5 mm) esophageal varices.                           - Mild portal hypertensive gastropathy.                           - Normal examined duodenum.                           - No gastric varices. Moderate Sedation:      N/A- Per Anesthesia Care Recommendation:           - Patient has a contact number available for  emergencies. The signs and symptoms of potential                            delayed complications were discussed with the                            patient. Return to normal activities tomorrow.                            Written discharge instructions were provided to the                            patient.                           - Resume previous diet.                           - Continue present medications for now. Will                            communicate with Dr. Johnsie Cancel about starting                            non-selective B blocker to decrease your risk of                            variceal bleeding. May substitue for metoprolol.                           - Repeat upper endoscopy in 1 year to evaluate the                             response to therapy. Procedure Code(s):        --- Professional ---                           [phone], Esophagogastroduodenoscopy, flexible,                            transoral; diagnostic, including collection of                            specimen(s) by brushing or washing, when performed                            (separate procedure) Diagnosis Code(s):        --- Professional ---                           I85.00, Esophageal varices without bleeding                           K76.6, Portal hypertension  K31.89, Other diseases of stomach and duodenum                           Z13.810, Encounter for screening for upper                            gastrointestinal disorder CPT copyright 2016 American Medical Association. All rights reserved. The codes documented in this report are preliminary and upon coder review may  be revised to meet current compliance requirements. [name], MD 07/07/2016 11:27:10 AM This report has been signed electronically. Number of Addenda: 0

## 2016-07-07 NOTE — Discharge Instructions (Signed)

## 2016-07-08 ENCOUNTER — Other Ambulatory Visit: Payer: Self-pay | Admitting: Internal Medicine

## 2016-07-08 ENCOUNTER — Other Ambulatory Visit: Payer: Medicare Other | Admitting: *Deleted

## 2016-07-08 ENCOUNTER — Ambulatory Visit (HOSPITAL_COMMUNITY): Payer: Medicare Other

## 2016-07-08 DIAGNOSIS — E1165 Type 2 diabetes mellitus with hyperglycemia: Principal | ICD-10-CM

## 2016-07-08 DIAGNOSIS — I2510 Atherosclerotic heart disease of native coronary artery without angina pectoris: Secondary | ICD-10-CM

## 2016-07-08 DIAGNOSIS — IMO0001 Reserved for inherently not codable concepts without codable children: Secondary | ICD-10-CM

## 2016-07-08 DIAGNOSIS — E7800 Pure hypercholesterolemia, unspecified: Secondary | ICD-10-CM | POA: Diagnosis not present

## 2016-07-08 DIAGNOSIS — Z794 Long term (current) use of insulin: Principal | ICD-10-CM

## 2016-07-08 LAB — HEPATIC FUNCTION PANEL
ALBUMIN: 3.5 g/dL — AB (ref 3.6–4.8)
ALK PHOS: 76 IU/L (ref 39–117)
ALT: 46 IU/L — AB (ref 0–32)
AST: 62 IU/L — AB (ref 0–40)
Bilirubin Total: 0.3 mg/dL (ref 0.0–1.2)
Bilirubin, Direct: 0.14 mg/dL (ref 0.00–0.40)
Total Protein: 6.7 g/dL (ref 6.0–8.5)

## 2016-07-08 LAB — LIPID PANEL
CHOLESTEROL TOTAL: 185 mg/dL (ref 100–199)
Chol/HDL Ratio: 3.4 ratio units (ref 0.0–4.4)
HDL: 55 mg/dL (ref >39)
LDL CALC: 117 mg/dL — AB (ref 0–99)
TRIGLYCERIDES: 67 mg/dL (ref 0–149)
VLDL CHOLESTEROL CAL: 13 mg/dL (ref 5–40)

## 2016-07-08 NOTE — Addendum Note (Signed)
Addended by: Eulis Foster on: 07/08/2016 10:44 AM   Modules accepted: Orders

## 2016-07-08 NOTE — Addendum Note (Signed)
Addended by: Eulis Foster on: 07/08/2016 10:45 AM   Modules accepted: Orders

## 2016-07-11 ENCOUNTER — Encounter (HOSPITAL_COMMUNITY): Payer: Self-pay | Admitting: Gastroenterology

## 2016-07-11 ENCOUNTER — Telehealth: Payer: Self-pay

## 2016-07-11 ENCOUNTER — Ambulatory Visit (HOSPITAL_COMMUNITY): Payer: Medicare Other

## 2016-07-11 MED ORDER — NADOLOL 80 MG PO TABS: 80.0000 mg | ORAL_TABLET | Freq: Every day | ORAL | 6 refills | Status: DC

## 2016-07-11 NOTE — Telephone Encounter (Signed)
-----   Message from [name], MD sent at 07/10/2016  8:19 PM EST ----- Can you let her know I discussed with Dr. Johnsie Cancel. SHe should STOP the metoprolol and start nadolol 45m once daily (disp one month with 6 refills).  She needs BP, HR visit in 10 days and the results routed to me.  THANKS  DJ ----- Message ----- From: PJosue Hector MD Sent: 07/07/2016   1:44 PM To: DMilus Banister MD  Yes and titrate nadolol to HR goal  ----- Message ----- From: DMilus Banister MD Sent: 07/07/2016  12:15 PM To: PJosue Hector MD  So stop the metoprolol completely?   ----- Message ----- From: PJosue Hector MD Sent: 07/07/2016  12:14 PM To: DMilus Banister MD  That's fine would start with at least 80 mg of nadolol   ----- Message ----- From: DMilus Banister MD Sent: 07/07/2016  11:30 AM To: PJosue Hector MD  PTrinna Balloonhas pretty large esophageal varices from her recently discovered cirrhosis.  I'd like to start her on non-selective B blocker (usually nadolol, titrating upwards as tolerated by HR to goal 60 and BP).  She's on metoprolol 37.533mBID  and her HR is already 58-65. Wondering if you think I can (1)switch her to only nadolol or (2)start a hybrid nadolol/metoprolol regimen.  Thanks DJ

## 2016-07-11 NOTE — Telephone Encounter (Signed)
-----   Message from [name], MD sent at 07/10/2016  8:19 PM EST ----- Can you let her know I discussed with Dr. Johnsie Cancel. SHe should STOP the metoprolol and start nadolol 99m once daily (disp one month with 6 refills).  She needs BP, HR visit in 10 days and the results routed to me.  THANKS  DJ ----- Message ----- From: PJosue Hector MD Sent: 07/07/2016   1:44 PM To: DMilus Banister MD  Yes and titrate nadolol to HR goal  ----- Message ----- From: DMilus Banister MD Sent: 07/07/2016  12:15 PM To: PJosue Hector MD  So stop the metoprolol completely?   ----- Message ----- From: PJosue Hector MD Sent: 07/07/2016  12:14 PM To: DMilus Banister MD  That's fine would start with at least 80 mg of nadolol   ----- Message ----- From: DMilus Banister MD Sent: 07/07/2016  11:30 AM To: PJosue Hector MD  PTrinna Balloonhas pretty large esophageal varices from her recently discovered cirrhosis.  I'd like to start her on non-selective B blocker (usually nadolol, titrating upwards as tolerated by HR to goal 60 and BP).  She's on metoprolol 37.523mBID  and her HR is already 58-65. Wondering if you think I can (1)switch her to only nadolol or (2)start a hybrid nadolol/metoprolol regimen.  Thanks DJ

## 2016-07-11 NOTE — Telephone Encounter (Signed)
 Antolin, Please let her know that the polyp were typical adenomas. She needs recall colonoscoyp ion 5 years.   Recall in EPIC   Pt aware of the pathology and she was advised to stop metoprolol and start nadolol.  Prescription was sent.    Appt made for BR and HR check

## 2016-07-13 ENCOUNTER — Ambulatory Visit (HOSPITAL_COMMUNITY): Payer: Medicare Other

## 2016-07-15 ENCOUNTER — Telehealth: Payer: Self-pay

## 2016-07-15 ENCOUNTER — Other Ambulatory Visit: Payer: Self-pay

## 2016-07-15 ENCOUNTER — Ambulatory Visit (HOSPITAL_COMMUNITY): Payer: Medicare Other

## 2016-07-15 ENCOUNTER — Other Ambulatory Visit (INDEPENDENT_AMBULATORY_CARE_PROVIDER_SITE_OTHER): Payer: Medicare Other

## 2016-07-15 DIAGNOSIS — K7460 Unspecified cirrhosis of liver: Secondary | ICD-10-CM

## 2016-07-15 LAB — COMPREHENSIVE METABOLIC PANEL
ALT: 39 U/L — ABNORMAL HIGH (ref 0–35)
AST: 51 U/L — AB (ref 0–37)
Albumin: 3.4 g/dL — ABNORMAL LOW (ref 3.5–5.2)
Alkaline Phosphatase: 66 U/L (ref 39–117)
BUN: 17 mg/dL (ref 6–23)
CHLORIDE: 102 meq/L (ref 96–112)
CO2: 29 meq/L (ref 19–32)
CREATININE: 0.93 mg/dL (ref 0.40–1.20)
Calcium: 9.8 mg/dL (ref 8.4–10.5)
GFR: 63.42 mL/min (ref >60.00)
GLUCOSE: 227 mg/dL — AB (ref 70–99)
POTASSIUM: 4.4 meq/L (ref 3.5–5.1)
SODIUM: 138 meq/L (ref 135–145)
Total Bilirubin: 0.5 mg/dL (ref 0.2–1.2)
Total Protein: 7.1 g/dL (ref 6.0–8.3)

## 2016-07-15 NOTE — Telephone Encounter (Signed)
-----   Message from Larina Bras, [place] sent at 07/15/2016  9:42 AM EST ----- fyi- Ms. Eno did not come for her 3rd twinrix today. Looks like she has had a lot going on so maybe she just forgot. She is already scheduled for BP check on 3/15 so didn't know if you wanted her just to have twinrix then or if you guys have other plans.[name] KitchenMarland Kitchen

## 2016-07-18 ENCOUNTER — Ambulatory Visit (HOSPITAL_COMMUNITY): Payer: Medicare Other

## 2016-07-18 NOTE — Telephone Encounter (Signed)
Pt to have twin rix on the day of BP check

## 2016-07-20 ENCOUNTER — Ambulatory Visit: Payer: Self-pay | Admitting: Internal Medicine

## 2016-07-20 ENCOUNTER — Ambulatory Visit (HOSPITAL_COMMUNITY): Payer: Medicare Other

## 2016-07-21 ENCOUNTER — Telehealth: Payer: Self-pay

## 2016-07-21 ENCOUNTER — Ambulatory Visit (INDEPENDENT_AMBULATORY_CARE_PROVIDER_SITE_OTHER): Payer: Medicare Other | Admitting: Gastroenterology

## 2016-07-21 VITALS — BP 98/60 | HR 56

## 2016-07-21 DIAGNOSIS — Z23 Encounter for immunization: Secondary | ICD-10-CM | POA: Diagnosis not present

## 2016-07-21 DIAGNOSIS — K7469 Other cirrhosis of liver: Secondary | ICD-10-CM

## 2016-07-21 DIAGNOSIS — K7460 Unspecified cirrhosis of liver: Secondary | ICD-10-CM

## 2016-07-21 NOTE — Telephone Encounter (Signed)
Pt has been advised no changes in medication

## 2016-07-21 NOTE — Telephone Encounter (Signed)
-----   Message from Oda Kilts, [place] sent at 07/21/2016  1:59 PM EDT ----- Regarding: RE: BP HR FYI Rylah Fukuda ----- Message ----- From: [name], MD Sent: 07/21/2016  10:18 AM To: Oda Kilts, CMA Subject: RE: BP HR                                      Ok, no changed on her nadolol.  Thanks  dj  ----- Message ----- From: Oda Kilts, CMA Sent: 07/21/2016  10:08 AM To: [name], MD Subject: BP HR                                          Patient came in today for BP and HR check  BP 98/60    HR 56

## 2016-07-22 ENCOUNTER — Ambulatory Visit (HOSPITAL_COMMUNITY): Payer: Medicare Other

## 2016-07-22 ENCOUNTER — Other Ambulatory Visit: Payer: Self-pay | Admitting: *Deleted

## 2016-07-22 MED ORDER — CIPROFLOXACIN HCL 250 MG PO TABS: 250.0000 mg | ORAL_TABLET | Freq: Two times a day (BID) | ORAL | 1 refills | Status: DC | PRN

## 2016-07-22 NOTE — Telephone Encounter (Signed)
Patient requested. Medication in current medication list and in OV note.

## 2016-07-25 ENCOUNTER — Ambulatory Visit (HOSPITAL_COMMUNITY): Payer: Medicare Other

## 2016-07-27 ENCOUNTER — Ambulatory Visit (HOSPITAL_COMMUNITY): Payer: Medicare Other

## 2016-07-29 ENCOUNTER — Other Ambulatory Visit: Payer: Self-pay | Admitting: Internal Medicine

## 2016-07-29 ENCOUNTER — Ambulatory Visit (HOSPITAL_COMMUNITY): Payer: Medicare Other

## 2016-07-29 DIAGNOSIS — E1165 Type 2 diabetes mellitus with hyperglycemia: Principal | ICD-10-CM

## 2016-07-29 DIAGNOSIS — IMO0001 Reserved for inherently not codable concepts without codable children: Secondary | ICD-10-CM

## 2016-07-29 DIAGNOSIS — Z794 Long term (current) use of insulin: Principal | ICD-10-CM

## 2016-08-01 ENCOUNTER — Ambulatory Visit (HOSPITAL_COMMUNITY): Payer: Medicare Other

## 2016-08-03 ENCOUNTER — Ambulatory Visit (HOSPITAL_COMMUNITY): Payer: Medicare Other

## 2016-08-03 ENCOUNTER — Other Ambulatory Visit: Payer: Self-pay | Admitting: Internal Medicine

## 2016-08-04 DIAGNOSIS — H02201 Unspecified lagophthalmos right upper eyelid: Secondary | ICD-10-CM | POA: Diagnosis not present

## 2016-08-04 DIAGNOSIS — H02204 Unspecified lagophthalmos left upper eyelid: Secondary | ICD-10-CM | POA: Diagnosis not present

## 2016-08-04 DIAGNOSIS — H52222 Regular astigmatism, left eye: Secondary | ICD-10-CM | POA: Diagnosis not present

## 2016-08-04 DIAGNOSIS — H5201 Hypermetropia, right eye: Secondary | ICD-10-CM | POA: Diagnosis not present

## 2016-08-04 DIAGNOSIS — H2513 Age-related nuclear cataract, bilateral: Secondary | ICD-10-CM | POA: Diagnosis not present

## 2016-08-04 DIAGNOSIS — H524 Presbyopia: Secondary | ICD-10-CM | POA: Diagnosis not present

## 2016-08-04 DIAGNOSIS — H04123 Dry eye syndrome of bilateral lacrimal glands: Secondary | ICD-10-CM | POA: Diagnosis not present

## 2016-08-04 LAB — HM DIABETES EYE EXAM

## 2016-08-05 ENCOUNTER — Ambulatory Visit (HOSPITAL_COMMUNITY): Payer: Medicare Other

## 2016-08-07 ENCOUNTER — Other Ambulatory Visit: Payer: Self-pay | Admitting: Gastroenterology

## 2016-08-08 ENCOUNTER — Encounter: Payer: Self-pay | Admitting: *Deleted

## 2016-08-08 ENCOUNTER — Ambulatory Visit (HOSPITAL_COMMUNITY): Payer: Medicare Other

## 2016-08-10 ENCOUNTER — Ambulatory Visit (HOSPITAL_COMMUNITY): Payer: Medicare Other

## 2016-08-12 ENCOUNTER — Ambulatory Visit (HOSPITAL_COMMUNITY): Payer: Medicare Other

## 2016-08-15 ENCOUNTER — Ambulatory Visit (HOSPITAL_COMMUNITY): Payer: Medicare Other

## 2016-08-17 ENCOUNTER — Ambulatory Visit (HOSPITAL_COMMUNITY): Payer: Medicare Other

## 2016-08-19 ENCOUNTER — Ambulatory Visit (HOSPITAL_COMMUNITY): Payer: Medicare Other

## 2016-08-19 ENCOUNTER — Other Ambulatory Visit: Payer: Medicare Other

## 2016-08-19 DIAGNOSIS — K7460 Unspecified cirrhosis of liver: Secondary | ICD-10-CM

## 2016-08-19 DIAGNOSIS — I10 Essential (primary) hypertension: Secondary | ICD-10-CM

## 2016-08-19 DIAGNOSIS — E1165 Type 2 diabetes mellitus with hyperglycemia: Secondary | ICD-10-CM

## 2016-08-19 DIAGNOSIS — E1159 Type 2 diabetes mellitus with other circulatory complications: Secondary | ICD-10-CM | POA: Diagnosis not present

## 2016-08-19 DIAGNOSIS — D508 Other iron deficiency anemias: Secondary | ICD-10-CM | POA: Diagnosis not present

## 2016-08-19 DIAGNOSIS — Z794 Long term (current) use of insulin: Secondary | ICD-10-CM

## 2016-08-19 DIAGNOSIS — IMO0002 Reserved for concepts with insufficient information to code with codable children: Secondary | ICD-10-CM

## 2016-08-19 DIAGNOSIS — R188 Other ascites: Secondary | ICD-10-CM

## 2016-08-19 LAB — COMPREHENSIVE METABOLIC PANEL
ALBUMIN: 3.3 g/dL — AB (ref 3.6–5.1)
ALK PHOS: 70 U/L (ref 33–130)
ALT: 41 U/L — AB (ref 6–29)
AST: 49 U/L — AB (ref 10–35)
BILIRUBIN TOTAL: 0.6 mg/dL (ref 0.2–1.2)
BUN: 16 mg/dL (ref 7–25)
CALCIUM: 10 mg/dL (ref 8.6–10.4)
CO2: 29 mmol/L (ref 20–31)
CREATININE: 0.88 mg/dL (ref 0.50–0.99)
Chloride: 103 mmol/L (ref 98–110)
Glucose, Bld: 88 mg/dL (ref 65–99)
Potassium: 4.3 mmol/L (ref 3.5–5.3)
SODIUM: 139 mmol/L (ref 135–146)
TOTAL PROTEIN: 7 g/dL (ref 6.1–8.1)

## 2016-08-19 LAB — CBC WITH DIFFERENTIAL/PLATELET
BASOS PCT: 1 %
Basophils Absolute: 43 cells/uL (ref 0–200)
EOS PCT: 3 %
Eosinophils Absolute: 129 cells/uL (ref 15–500)
HEMATOCRIT: 38.5 % (ref 35.0–45.0)
Hemoglobin: 12.6 g/dL (ref 11.7–15.5)
LYMPHS PCT: 30 %
Lymphs Abs: 1290 cells/uL (ref 850–3900)
MCH: 28.9 pg (ref 27.0–33.0)
MCHC: 32.7 g/dL (ref 32.0–36.0)
MCV: 88.3 fL (ref 80.0–100.0)
Monocytes Absolute: 645 cells/uL (ref 200–950)
Monocytes Relative: 15 %
NEUTROS PCT: 51 %
Neutro Abs: 2193 cells/uL (ref 1500–7800)
Platelets: 139 10*3/uL — ABNORMAL LOW (ref 140–400)
RBC: 4.36 MIL/uL (ref 3.80–5.10)
RDW: 16.6 % — ABNORMAL HIGH (ref 11.0–15.0)
WBC: 4.3 10*3/uL (ref 3.8–10.8)

## 2016-08-22 ENCOUNTER — Ambulatory Visit (HOSPITAL_COMMUNITY): Payer: Medicare Other

## 2016-08-23 ENCOUNTER — Ambulatory Visit (INDEPENDENT_AMBULATORY_CARE_PROVIDER_SITE_OTHER): Payer: Medicare Other | Admitting: Internal Medicine

## 2016-08-23 ENCOUNTER — Other Ambulatory Visit: Payer: Self-pay | Admitting: Internal Medicine

## 2016-08-23 ENCOUNTER — Encounter: Payer: Self-pay | Admitting: Internal Medicine

## 2016-08-23 VITALS — BP 118/62 | HR 55 | Temp 97.8°F | Ht 63.0 in | Wt 138.0 lb

## 2016-08-23 DIAGNOSIS — E1165 Type 2 diabetes mellitus with hyperglycemia: Secondary | ICD-10-CM

## 2016-08-23 DIAGNOSIS — K7460 Unspecified cirrhosis of liver: Secondary | ICD-10-CM

## 2016-08-23 DIAGNOSIS — Z794 Long term (current) use of insulin: Secondary | ICD-10-CM | POA: Diagnosis not present

## 2016-08-23 DIAGNOSIS — I2510 Atherosclerotic heart disease of native coronary artery without angina pectoris: Secondary | ICD-10-CM | POA: Diagnosis not present

## 2016-08-23 DIAGNOSIS — C44301 Unspecified malignant neoplasm of skin of nose: Secondary | ICD-10-CM | POA: Diagnosis not present

## 2016-08-23 DIAGNOSIS — R188 Other ascites: Secondary | ICD-10-CM

## 2016-08-23 DIAGNOSIS — I10 Essential (primary) hypertension: Secondary | ICD-10-CM

## 2016-08-23 DIAGNOSIS — E7800 Pure hypercholesterolemia, unspecified: Secondary | ICD-10-CM | POA: Diagnosis not present

## 2016-08-23 DIAGNOSIS — D508 Other iron deficiency anemias: Secondary | ICD-10-CM | POA: Diagnosis not present

## 2016-08-23 DIAGNOSIS — Z1231 Encounter for screening mammogram for malignant neoplasm of breast: Secondary | ICD-10-CM

## 2016-08-23 MED ORDER — INSULIN DETEMIR 100 UNIT/ML ~~LOC~~ SOLN: SUBCUTANEOUS | 0 refills | Status: DC

## 2016-08-23 NOTE — Progress Notes (Signed)
Facility  [facility]    Place of Service:   OFFICE    Allergies  Allergen Reactions  . Kiwi Extract Anaphylaxis  . Tdap [Diphth-Acell Pertussis-Tetanus] Swelling and Other (See Comments)    Swelling at injection site, gets very hot  . Statins     RHABDOMYOLYSIS  . Latex Itching, Dermatitis and Rash  . Tramadol Nausea And Vomiting    Chief Complaint  Patient presents with  . Medical Management of Chronic Issues    2 month medication management anemia, blood sugar, blood pressure, cirrhosis of liver, GERD, arthritis, review labs.    HPI:  Lesion on the tip of the nose' left supraclavicular area and right supraclavicular area.  Controlled type 2 diabetes mellitus with hyperglycemia, with long-term current use of insulin ([place]) - having some episodes of hypoglycemia. She cut back on the insulin  Cirrhosis of liver without ascites, unspecified hepatic cirrhosis type ([place]) - imroved  Coronary artery disease involving native coronary artery of native heart without angina pectoris - no angina  Essential hypertension - controlled  Other iron deficiency anemia - resolved  Other ascites - resolved    Medications: Patient's Medications  New Prescriptions   No medications on file  Previous Medications   ACETAMINOPHEN (TYLENOL) 325 MG TABLET    Take 2 tablets (650 mg total) by mouth every 6 (six) hours as needed for mild pain.   AROMATIC INHALANTS (VICKS VAPOR IN)    Vicks Vapor Rub apply small amount to outside of nose to help breathing   ASPIRIN EC 325 MG EC TABLET    Take 1 tablet (325 mg total) by mouth daily.   BIOTIN 34287 MCG TABS    Take 10,000 mcg by mouth every morning.   CALCIUM CARBONATE (OS-CAL) 600 MG TABS    Take 600 mg by mouth 2 (two) times daily with a meal.     CHOLECALCIFEROL (VITAMIN D) 1000 UNITS TABLET    Take 1,000 Units by mouth daily.     CIPROFLOXACIN (CIPRO) 250 MG TABLET    Take 1 tablet (250 mg total) by mouth 2 (two) times daily as needed (to prevent  UTIs). Take one tablet after intercourse to prevent urinary tract infection   CYANOCOBALAMIN (VITAMIN B 12 PO)    Take 1,000 mcg by mouth daily.     FERROUS SULFATE 325 (65 FE) MG TABLET    TAKE 1 TABLET(325 MG) BY MOUTH TWICE DAILY WITH A MEAL   FISH OIL-OMEGA-3 FATTY ACIDS 1000 MG CAPSULE    Take 1 g by mouth daily.     FLUTICASONE (FLONASE) 50 MCG/ACT NASAL SPRAY    Place 2 sprays into both nostrils daily as needed for allergies or rhinitis.   FUROSEMIDE (LASIX) 40 MG TABLET    Take 40 mg by mouth daily.   LEVEMIR 100 UNIT/ML INJECTION    INJECT 40 UNITS EVERY NIGHT AT BEDTIME   MAGNESIUM PO    Take 500 mg by mouth 2 (two) times daily in the am and at bedtime.[name] Kitchen   METFORMIN (GLUCOPHAGE) 1000 MG TABLET    TAKE 1 TABLET BY MOUTH TWICE DAILY TO TREAT DIABETES   MULTIPLE VITAMINS-MINERALS (MULTIVITAMIN WITH MINERALS) TABLET    Take 1 tablet by mouth daily.     NADOLOL (CORGARD) 80 MG TABLET    Take 1 tablet (80 mg total) by mouth daily.   OMEPRAZOLE (PRILOSEC) 20 MG CAPSULE    TAKE ONE CAPSULE BY MOUTH DAILY   POLYETHYL GLYCOL-PROPYL GLYCOL (SYSTANE  OP)    Place 1 drop into both eyes 2 (two) times daily.   POTASSIUM CHLORIDE SA (K-DUR,KLOR-CON) 20 MEQ TABLET    Take 20 mEq by mouth at bedtime.    PROBIOTIC PRODUCT (PROBIOTIC DAILY PO)    Take 1 capsule by mouth daily. Digestive Advantage Probiotic   SPIRONOLACTONE (ALDACTONE) 100 MG TABLET    Take 1 tablet (100 mg total) by mouth daily.  Modified Medications   No medications on file  Discontinued Medications   MELOXICAM (MOBIC) 15 MG TABLET    TAKE 1 TABLET BY MOUTH DAILY TO HELP ARTHRITIS AND BURSITIS   SPIRONOLACTONE (ALDACTONE) 100 MG TABLET    TAKE 1 TABLET(100 MG) BY MOUTH TWICE DAILY    Review of Systems  Constitutional: Negative.   HENT: Positive for hearing loss and postnasal drip.   Eyes: Negative.   Respiratory: Negative.   Cardiovascular: Positive for chest pain. Negative for palpitations and leg swelling.  Gastrointestinal:        Occasional heartburn  Endocrine:       Diabetic  Musculoskeletal: Positive for arthralgias, back pain and myalgias. Negative for gait problem and joint swelling.  Skin:       Complains of thinning hair. Likely skin cancer at the tip of her nose.  Neurological:       Distal paresthesias of tingling and burning in the feet and lower legs  Hematological: Negative.   Psychiatric/Behavioral: Positive for sleep disturbance (insomnia).    Vitals:   08/23/16 1549  BP: 118/62  Pulse: (!) 55  Temp: 97.8 F (36.6 C)  TempSrc: Oral  SpO2: 95%  Weight: 138 lb (62.6 kg)  Height: 5' 3" (1.6 m)   Body mass index is 24.45 kg/m. Wt Readings from Last 3 Encounters:  08/23/16 138 lb (62.6 kg)  07/07/16 135 lb (61.2 kg)  06/29/16 135 lb 1.9 oz (61.3 kg)      Physical Exam  Constitutional: She is oriented to person, place, and time. She appears well-developed and well-nourished. No distress.  HENT:  Head: Normocephalic and atraumatic.  Right Ear: External ear normal.  Left Ear: External ear normal.  Nose: Nose normal.  Mouth/Throat: Oropharynx is clear and moist. No oropharyngeal exudate.  Bilateral hearing aids  Eyes: Conjunctivae and EOM are normal. Pupils are equal, round, and reactive to light. No scleral icterus.  Neck: Neck supple. No JVD present. No tracheal deviation present. No thyromegaly present.  Cardiovascular: Normal rate, regular rhythm, normal heart sounds and intact distal pulses.  Exam reveals no gallop and no friction rub.   No murmur heard. Pulmonary/Chest: Effort normal and breath sounds normal. No respiratory distress. She has no wheezes. She has no rales. She exhibits no tenderness.  Abdominal: Bowel sounds are normal. She exhibits no distension and no mass. There is no tenderness.  Mild abdominal distention.  Musculoskeletal: Normal range of motion. She exhibits no edema or tenderness.  Patient has evidence of  rupture long head of the biceps proximally.in the  right mid upper arm. It is not tender. Previous bruising is resolved. The muscle is balled up in the mid right upper arm. Tender to percussion in the lower back. Plan left greater trochanter to palpation.  Lymphadenopathy:    She has no cervical adenopathy.  Neurological: She is alert and oriented to person, place, and time. She has normal reflexes. No cranial nerve deficit. Coordination normal.  12/112/17 MMSE 29/30. Passed clock drawing.  Skin: No rash noted. She is not diaphoretic. No erythema.  No pallor.  Hair is quite thin over the scalp. Red papule of the tip of the nose.  Psychiatric: She has a normal mood and affect. Her behavior is normal. Judgment and thought content normal.    Labs reviewed: Lab Summary Latest Ref Rng & Units 08/19/2016 07/15/2016 07/08/2016  Hemoglobin 11.7 - 15.5 g/dL 12.6 (None) (None)  Hematocrit 35.0 - 45.0 % 38.5 (None) (None)  White count 3.8 - 10.8 K/uL 4.3 (None) (None)  Platelet count 140 - 400 K/uL 139(L) (None) (None)  Sodium 135 - 146 mmol/L 139 138 (None)  Potassium 3.5 - 5.3 mmol/L 4.3 4.4 (None)  Calcium 8.6 - 10.4 mg/dL 10.0 9.8 (None)  Phosphorus - (None) (None) (None)  Creatinine 0.50 - 0.99 mg/dL 0.88 0.93 (None)  AST 10 - 35 U/L 49(H) 51(H) 62(H)  Alk Phos 33 - 130 U/L 70 66 76  Bilirubin 0.2 - 1.2 mg/dL 0.6 0.5 0.3  Glucose 65 - 99 mg/dL 88 227(H) (None)  Cholesterol - (None) (None) (None)  HDL cholesterol >39 mg/dL (None) (None) 55  Triglycerides 0 - 149 mg/dL (None) (None) 67  LDL Direct - (None) (None) (None)  LDL Calc 0 - 99 mg/dL (None) (None) 117(H)  Total protein 6.1 - 8.1 g/dL 7.0 7.1 (None)  Albumin 3.6 - 5.1 g/dL 3.3(L) 3.4(L) 3.5(L)  Some recent data might be hidden   Lab Results  Component Value Date   TSH 2.150 06/23/2014   Lab Results  Component Value Date   BUN 16 08/19/2016   BUN 17 07/15/2016   BUN 20 06/24/2016   Lab Results  Component Value Date   HGBA1C 7.8 (H) 06/16/2016   HGBA1C 8.0 (H) 04/15/2016    HGBA1C 8.4 (H) 01/28/2016    Assessment/Plan  1. Controlled type 2 diabetes mellitus with hyperglycemia, with long-term current use of insulin ([facility]) Reduce insulin further if low glucose persists - Comprehensive metabolic panel; Future - Hemoglobin A1c; Future - insulin detemir (LEVEMIR) 100 UNIT/ML injection; INJECT 45 UNITS EVERY NIGHT AT BEDTIME  Dispense: 10 mL; Refill: 0  2. Cirrhosis of liver without ascites, unspecified hepatic cirrhosis type ([place]) improved - Comprehensive metabolic panel; Future  3. Coronary artery disease involving native coronary artery of native heart without angina pectoris stable  4. Essential hypertension controlled - Comprehensive metabolic panel; Future  5. Other iron deficiency anemia Stop iron. Resolved.  6. Other ascites resolved  7. Pure hypercholesterolemia controlled - Lipid panel; Future  8. Skin cancer of nose - Ambulatory referral to Dermatology

## 2016-08-24 ENCOUNTER — Ambulatory Visit (HOSPITAL_COMMUNITY): Payer: Medicare Other

## 2016-08-26 ENCOUNTER — Ambulatory Visit (HOSPITAL_COMMUNITY): Payer: Medicare Other

## 2016-08-29 ENCOUNTER — Ambulatory Visit (HOSPITAL_COMMUNITY): Payer: Medicare Other

## 2016-08-30 DIAGNOSIS — N3020 Other chronic cystitis without hematuria: Secondary | ICD-10-CM | POA: Diagnosis not present

## 2016-08-31 ENCOUNTER — Ambulatory Visit (HOSPITAL_COMMUNITY): Payer: Medicare Other

## 2016-09-02 ENCOUNTER — Other Ambulatory Visit (INDEPENDENT_AMBULATORY_CARE_PROVIDER_SITE_OTHER): Payer: Medicare Other

## 2016-09-02 ENCOUNTER — Encounter: Payer: Self-pay | Admitting: Gastroenterology

## 2016-09-02 ENCOUNTER — Ambulatory Visit (INDEPENDENT_AMBULATORY_CARE_PROVIDER_SITE_OTHER): Payer: Medicare Other | Admitting: Gastroenterology

## 2016-09-02 VITALS — BP 104/60 | HR 56 | Ht 63.0 in | Wt 138.4 lb

## 2016-09-02 DIAGNOSIS — I2510 Atherosclerotic heart disease of native coronary artery without angina pectoris: Secondary | ICD-10-CM

## 2016-09-02 DIAGNOSIS — K7460 Unspecified cirrhosis of liver: Secondary | ICD-10-CM

## 2016-09-02 LAB — CBC WITH DIFFERENTIAL/PLATELET
Basophils Absolute: 0 10*3/uL (ref 0.0–0.1)
Basophils Relative: 0.9 % (ref 0.0–3.0)
EOS PCT: 2.6 % (ref 0.0–5.0)
Eosinophils Absolute: 0.1 10*3/uL (ref 0.0–0.7)
HCT: 41.2 % (ref 36.0–46.0)
Hemoglobin: 13.6 g/dL (ref 12.0–15.0)
LYMPHS ABS: 1.4 10*3/uL (ref 0.7–4.0)
Lymphocytes Relative: 26.5 % (ref 12.0–46.0)
MCHC: 33.1 g/dL (ref 30.0–36.0)
MCV: 89.9 fl (ref 78.0–100.0)
MONOS PCT: 16.5 % — AB (ref 3.0–12.0)
Monocytes Absolute: 0.9 10*3/uL (ref 0.1–1.0)
NEUTROS ABS: 2.9 10*3/uL (ref 1.4–7.7)
Neutrophils Relative %: 53.5 % (ref 43.0–77.0)
PLATELETS: 117 10*3/uL — AB (ref 150.0–400.0)
RBC: 4.58 Mil/uL (ref 3.87–5.11)
RDW: 15.5 % (ref 11.5–15.5)
WBC: 5.5 10*3/uL (ref 4.0–10.5)

## 2016-09-02 LAB — COMPREHENSIVE METABOLIC PANEL
ALT: 44 U/L — ABNORMAL HIGH (ref 0–35)
AST: 50 U/L — ABNORMAL HIGH (ref 0–37)
Albumin: 3.6 g/dL (ref 3.5–5.2)
Alkaline Phosphatase: 77 U/L (ref 39–117)
BUN: 16 mg/dL (ref 6–23)
CO2: 27 mEq/L (ref 19–32)
Calcium: 10.4 mg/dL (ref 8.4–10.5)
Chloride: 99 mEq/L (ref 96–112)
Creatinine, Ser: 0.85 mg/dL (ref 0.40–1.20)
GFR: 70.33 mL/min (ref >60.00)
Glucose, Bld: 185 mg/dL — ABNORMAL HIGH (ref 70–99)
Potassium: 4.2 mEq/L (ref 3.5–5.1)
Sodium: 136 mEq/L (ref 135–145)
Total Bilirubin: 0.7 mg/dL (ref 0.2–1.2)
Total Protein: 7.6 g/dL (ref 6.0–8.3)

## 2016-09-02 LAB — PROTIME-INR
INR: 1 ratio (ref 0.8–1.0)
PROTHROMBIN TIME: 10.9 s (ref 9.6–13.1)

## 2016-09-02 NOTE — Progress Notes (Signed)
Review of pertinent gastrointestinal problems: 1. Personal history of adenomatous colon polyps. Colonoscopy Dr. [name] 2012 August was normal except for 2 subcentimeter polyps, one was a tubular adenoma. She was recommended to have recall colonoscopy at five-year interval. She had also had previous colonoscopies with Dr. Lajoyce Corners and found to have adenomas as well. Colonoscopy Dr. [name] 07/2016 one subCM TA, recall at 5 years 2. Cirrhosis: likely from Fatty liver  Paracentesis 03/2016 4 liters, elevated SAAG, no SBP, cytology neg for malignancy  Most recent imaging: 02/2016 CT scan: cirrhosis with no masses  Most recent AFP:   Most recent EGD: 07/2016 EGD: large varices, + portal gastropathy; started nadolol after discussion with cardiology (nadolol 3m once dialy).  Immunization for Hep A/B:  Started 06/2016   HPI: This is a  very pleasant 71 year old woman whom I last saw the time of colonoscopy and upper endoscopy about a month and half ago  Chief complaint is cirrhosis  Doing well, breathing comfortably.  No overt bleeding.  She is tolerating her nadolol at 80 mg once daily.  Her weight is up 6 pounds since last visit 2-3 months ago (had decrease diuretics by 1/2 each at that visit).  No overt encephalopathy  ROS: complete GI ROS as described in HPI.  Constitutional:  No unintentional weight loss   Past Medical History:  Diagnosis Date  . Allergy   . Arthritis    neck  . Cataract    bilateral - MD monitoring cataracts  . CH[place]ive heart failure) ([place]   . Chronic kidney disease, stage I    DR OTTELIN  HX UTIS  . Cirrhosis ([facility]   . Cramp of limb   . Diabetes mellitus   . Dysphagia, unspecified(787.20)   . Dysuria   . Epistaxis   . GERD (gastroesophageal reflux disease)   . Heart murmur    NO CARDIOLOGIST  DX FOR YEARS ASYMPTOMATIC  . Lumbago   . Neoplasm of uncertain behavior of skin   . Nonspecific elevation of levels of transaminase or lactic acid  dehydrogenase (LDH)   . Osteoarthrosis, unspecified whether generalized or localized, unspecified site   . Other and unspecified hyperlipidemia    diet controlled  . Pain in joint, shoulder region   . Paresthesias 04/01/2015  . Postablative ovarian failure   . Trochanteric bursitis of left hip 12/15/2015  . Type 2 diabetes mellitus without complication ([facility]   . Unspecified essential hypertension    no meds    Past Surgical History:  Procedure Laterality Date  . CARDIAC CATHETERIZATION N/A 01/27/2016   Procedure: Left Heart Cath and Coronary Angiography;  Surgeon: HBelva Crome MD;  [facility];  Service: Cardiovascular;  Laterality: N/A;  . COLONOSCOPY  2012   [place] - polyp  . COLONOSCOPY WITH PROPOFOL N/A 07/07/2016   Procedure: COLONOSCOPY WITH PROPOFOL;  Surgeon: DMilus Banister MD;  [facility];  Service: Endoscopy;  Laterality: N/A;  . CORONARY ARTERY BYPASS GRAFT N/A 01/28/2016   Procedure: CORONARY ARTERY BYPASS GRAFTING (CABG) x 3 USING RIGHT LEG GREATER SAPHENOUS VEIN GRAFT;  Surgeon: SMelrose Nakayama MD;  [facility]  Service: Open Heart Surgery;  Laterality: N/A;  . ENDOVEIN HARVEST OF GREATER SAPHENOUS VEIN Right 01/28/2016   Procedure: ENDOVEIN HARVEST OF GREATER SAPHENOUS VEIN;  Surgeon: SMelrose Nakayama MD;  [facility]  Service: Open Heart Surgery;  Laterality: Right;  . ESOPHAGOGASTRODUODENOSCOPY (EGD) WITH PROPOFOL N/A 07/07/2016   Procedure: ESOPHAGOGASTRODUODENOSCOPY (EGD) WITH  PROPOFOL;  Surgeon: [name], MD;  Location[facility];  Service: Endoscopy;  Laterality: N/A;  . MAXIMUM ACCESS (MAS)POSTERIOR LUMBAR INTERBODY FUSION (PLIF) 1 LEVEL Left 06/10/2015   Procedure: FOR MAXIMUM ACCESS (MAS) POSTERIOR LUMBAR INTERBODY FUSION (PLIF) LUMBAR THREE-FOUR EXTRAFORAMINAL MICRODISCECTOMY LUMBAR FIVE-SACRAL ONE LEFT;  Surgeon: Eustace Moore, MD;  Location: [facility];  Service: Neurosurgery;  Laterality: Left;  . TEE  WITHOUT CARDIOVERSION N/A 01/28/2016   Procedure: TRANSESOPHAGEAL ECHOCARDIOGRAM (TEE);  Surgeon: Melrose Nakayama, MD;  Location: [facility];  Service: Open Heart Surgery;  Laterality: N/A;  . TUBAL LIGATION  1982   Dr Connye Burkitt  . UPPER GASTROINTESTINAL ENDOSCOPY    . VAGINAL HYSTERECTOMY  1997   Dr Rande Lawman    Current Outpatient Prescriptions  Medication Sig Dispense Refill  . acetaminophen (TYLENOL) 325 MG tablet Take 2 tablets (650 mg total) by mouth every 6 (six) hours as needed for mild pain.    . Aromatic Inhalants (VICKS VAPOR IN) Vicks Vapor Rub apply small amount to outside of nose to help breathing    . aspirin EC 325 MG EC tablet Take 1 tablet (325 mg total) by mouth daily. 30 tablet 0  . Biotin 10000 MCG TABS Take 10,000 mcg by mouth every morning.    . calcium carbonate (OS-CAL) 600 MG TABS Take 600 mg by mouth 2 (two) times daily with a meal.      . cholecalciferol (VITAMIN D) 1000 UNITS tablet Take 1,000 Units by mouth daily.      . ciprofloxacin (CIPRO) 250 MG tablet Take 1 tablet (250 mg total) by mouth 2 (two) times daily as needed (to prevent UTIs). Take one tablet after intercourse to prevent urinary tract infection 20 tablet 1  . Cyanocobalamin (VITAMIN B 12 PO) Take 1,000 mcg by mouth daily.      . fish oil-omega-3 fatty acids 1000 MG capsule Take 1 g by mouth daily.      . fluticasone (FLONASE) 50 MCG/ACT nasal spray Place 2 sprays into both nostrils daily as needed for allergies or rhinitis.    . furosemide (LASIX) 40 MG tablet Take 40 mg by mouth daily.    . insulin detemir (LEVEMIR) 100 UNIT/ML injection INJECT 45 UNITS EVERY NIGHT AT BEDTIME 10 mL 0  . MAGNESIUM PO Take 500 mg by mouth 2 (two) times daily in the am and at bedtime..    . metFORMIN (GLUCOPHAGE) 1000 MG tablet TAKE 1 TABLET BY MOUTH TWICE DAILY TO TREAT DIABETES 180 tablet 1  . Multiple Vitamins-Minerals (MULTIVITAMIN WITH MINERALS) tablet Take 1 tablet by mouth daily.      . nadolol (CORGARD) 80 MG tablet  Take 1 tablet (80 mg total) by mouth daily. 30 tablet 6  . omeprazole (PRILOSEC) 20 MG capsule TAKE ONE CAPSULE BY MOUTH DAILY 90 capsule 1  . Polyethyl Glycol-Propyl Glycol (SYSTANE OP) Place 1 drop into both eyes 2 (two) times daily.    . potassium chloride SA (K-DUR,KLOR-CON) 20 MEQ tablet Take 20 mEq by mouth at bedtime.     . Probiotic Product (PROBIOTIC DAILY PO) Take 1 capsule by mouth daily. Digestive Advantage Probiotic    . spironolactone (ALDACTONE) 100 MG tablet Take 1 tablet (100 mg total) by mouth daily. 60 tablet 6   No current facility-administered medications for this visit.     Allergies as of 09/02/2016 - Review Complete 09/02/2016  Allergen Reaction Noted  . Kiwi extract Anaphylaxis 05/28/2015  . Tdap [diphth-acell pertussis-tetanus] Swelling and Other (  See Comments) 03/27/2013  . Statins  05/08/2016  . Latex Itching, Dermatitis, and Rash 12/22/2010  . Tramadol Nausea And Vomiting 05/28/2015    Family History  Problem Relation Age of Onset  . Lung cancer Father   . Arthritis Sister   . Arthritis Brother   . Heart disease Maternal Grandmother   . Heart disease Maternal Grandfather   . Heart disease Paternal Grandmother   . Heart disease Paternal Grandfather   . Breast cancer Mother   . Liver cancer Brother   . Colon cancer Neg Hx   . Esophageal cancer Neg Hx   . Rectal cancer Neg Hx   . Stomach cancer Neg Hx     Social History   Social History  . Marital status: Married    Spouse name: N/A  . Number of children: 6  . Years of education: N/A   Occupational History  . retired    Social History Main Topics  . Smoking status: Never Smoker  . Smokeless tobacco: Never Used  . Alcohol use No  . Drug use: No  . Sexual activity: Yes    Partners: Male    Birth control/ protection: Post-menopausal, Surgical     Comment: Hysterectomy   Other Topics Concern  . Not on file   Social History Narrative  . No narrative on file     Physical Exam: BP  104/60   Pulse (!) 56   Ht 5' 3"  (1.6 m)   Wt 138 lb 6 oz (62.8 kg)   BMI 24.51 kg/m  Constitutional: generally well-appearing Psychiatric: alert and oriented x3 Abdomen: soft, nontender, nondistended, no obvious ascites, no peritoneal signs, normal bowel sounds No peripheral edema noted in lower extremities  Assessment and plan: 71 y.o. female with Decompensated cirrhosis  Her edema, ascites are under good control on current diuretic doses. She will continue to check her weights daily and those call she has significant increase or decrease. She is tolerating nadolol at 80 mg once daily. Her heart rate today was 36 and her blood pressure is adequate. She has very mild lightheadedness only rarely says that something she can deal with. She understands that this medicine will help decrease the chances of bleeding from her large esophageal varices. We'll make sure she is on track with her hepatitis A, B immunization series. She will get a repeat set of labs today to check liver function. She is due for hepatoma screening which will be done with alpha-fetoprotein and ultrasound now and then every 6 months. She will return to see me in 6 months and sooner if needed  Please see the "Patient Instructions" section for addition details about the plan.  Owens Loffler, MD [facility] 09/02/2016, 9:03 AM

## 2016-09-02 NOTE — Patient Instructions (Addendum)
You will have labs checked today in the basement lab.  Please head down after you check out with the front desk  (cbc, cmet, inr, AFP).  You will be set up for an ultrasound for hepatoma screening.   You have been scheduled for an abdominal ultrasound at [facility] (1st floor of hospital) on 09/12/16 at 9 am. Please arrive 15 minutes prior to your appointment for registration. Make certain not to have anything to eat or drink 6 hours prior to your appointment. Should you need to reschedule your appointment, please contact radiology at [phone]. This test typically takes about 30 minutes to perform.  Will make sure your doing well with Hep A, B series.  You will be due for a booster in March of 2019.    Please return to see Dr. [name] in  Months, sooner if needed.  Continue checking weight daily.

## 2016-09-05 LAB — AFP TUMOR MARKER: AFP-Tumor Marker: 1.6 ng/mL (ref <6.1)

## 2016-09-06 DIAGNOSIS — L603 Nail dystrophy: Secondary | ICD-10-CM | POA: Diagnosis not present

## 2016-09-06 DIAGNOSIS — L821 Other seborrheic keratosis: Secondary | ICD-10-CM | POA: Diagnosis not present

## 2016-09-06 DIAGNOSIS — L570 Actinic keratosis: Secondary | ICD-10-CM | POA: Diagnosis not present

## 2016-09-06 DIAGNOSIS — D1801 Hemangioma of skin and subcutaneous tissue: Secondary | ICD-10-CM | POA: Diagnosis not present

## 2016-09-12 ENCOUNTER — Ambulatory Visit
Admission: RE | Admit: 2016-09-12 | Discharge: 2016-09-12 | Disposition: A | Discharge status: Home / Self Care | Payer: Medicare Other | Source: Ambulatory Visit | Attending: Internal Medicine | Admitting: Internal Medicine

## 2016-09-12 ENCOUNTER — Ambulatory Visit (HOSPITAL_COMMUNITY)
Admission: RE | Admit: 2016-09-12 | Discharge: 2016-09-12 | Disposition: A | Discharge status: Home / Self Care | Payer: Medicare Other | Source: Ambulatory Visit | Attending: Gastroenterology | Admitting: Gastroenterology

## 2016-09-12 DIAGNOSIS — K8020 Calculus of gallbladder without cholecystitis without obstruction: Secondary | ICD-10-CM | POA: Insufficient documentation

## 2016-09-12 DIAGNOSIS — K7460 Unspecified cirrhosis of liver: Secondary | ICD-10-CM | POA: Diagnosis not present

## 2016-09-12 DIAGNOSIS — I864 Gastric varices: Secondary | ICD-10-CM | POA: Diagnosis not present

## 2016-09-12 DIAGNOSIS — Z1231 Encounter for screening mammogram for malignant neoplasm of breast: Secondary | ICD-10-CM

## 2016-09-12 DIAGNOSIS — R188 Other ascites: Secondary | ICD-10-CM | POA: Diagnosis not present

## 2016-09-13 ENCOUNTER — Other Ambulatory Visit: Payer: Self-pay | Admitting: Internal Medicine

## 2016-09-13 DIAGNOSIS — R928 Other abnormal and inconclusive findings on diagnostic imaging of breast: Secondary | ICD-10-CM

## 2016-09-15 ENCOUNTER — Other Ambulatory Visit: Payer: Self-pay | Admitting: Internal Medicine

## 2016-09-15 ENCOUNTER — Ambulatory Visit
Admission: RE | Admit: 2016-09-15 | Discharge: 2016-09-15 | Disposition: A | Discharge status: Home / Self Care | Payer: Medicare Other | Source: Ambulatory Visit | Attending: Internal Medicine | Admitting: Internal Medicine

## 2016-09-15 DIAGNOSIS — R928 Other abnormal and inconclusive findings on diagnostic imaging of breast: Secondary | ICD-10-CM

## 2016-09-15 DIAGNOSIS — R921 Mammographic calcification found on diagnostic imaging of breast: Secondary | ICD-10-CM | POA: Diagnosis not present

## 2016-09-19 ENCOUNTER — Ambulatory Visit
Admission: RE | Admit: 2016-09-19 | Discharge: 2016-09-19 | Disposition: A | Discharge status: Home / Self Care | Payer: Medicare Other | Source: Ambulatory Visit | Attending: Internal Medicine | Admitting: Internal Medicine

## 2016-09-19 ENCOUNTER — Other Ambulatory Visit: Payer: Self-pay | Admitting: Internal Medicine

## 2016-09-19 DIAGNOSIS — D242 Benign neoplasm of left breast: Secondary | ICD-10-CM | POA: Diagnosis not present

## 2016-09-19 DIAGNOSIS — R928 Other abnormal and inconclusive findings on diagnostic imaging of breast: Secondary | ICD-10-CM

## 2016-09-19 DIAGNOSIS — R920 Mammographic microcalcification found on diagnostic imaging of breast: Secondary | ICD-10-CM | POA: Diagnosis not present

## 2016-09-23 NOTE — Progress Notes (Signed)
This encounter was created in error - please disregard.

## 2016-09-30 ENCOUNTER — Other Ambulatory Visit: Payer: Self-pay | Admitting: Internal Medicine

## 2016-10-10 ENCOUNTER — Encounter: Payer: Self-pay | Admitting: Internal Medicine

## 2016-10-29 ENCOUNTER — Other Ambulatory Visit: Payer: Self-pay | Admitting: Internal Medicine

## 2016-10-29 DIAGNOSIS — E1165 Type 2 diabetes mellitus with hyperglycemia: Secondary | ICD-10-CM

## 2016-10-29 DIAGNOSIS — Z794 Long term (current) use of insulin: Secondary | ICD-10-CM

## 2016-11-03 NOTE — Addendum Note (Signed)
Addended by: Royann Shivers A on: 11/03/2016 04:04 PM   Modules accepted: Orders

## 2016-11-18 ENCOUNTER — Other Ambulatory Visit: Payer: Self-pay | Admitting: Internal Medicine

## 2016-11-18 DIAGNOSIS — E876 Hypokalemia: Secondary | ICD-10-CM

## 2016-12-09 ENCOUNTER — Other Ambulatory Visit: Payer: Self-pay | Admitting: Thoracic Surgery (Cardiothoracic Vascular Surgery)

## 2016-12-15 ENCOUNTER — Other Ambulatory Visit: Payer: Self-pay | Admitting: Internal Medicine

## 2016-12-15 DIAGNOSIS — E1165 Type 2 diabetes mellitus with hyperglycemia: Secondary | ICD-10-CM

## 2016-12-15 DIAGNOSIS — Z794 Long term (current) use of insulin: Secondary | ICD-10-CM

## 2016-12-16 ENCOUNTER — Other Ambulatory Visit: Payer: Self-pay

## 2016-12-16 DIAGNOSIS — I10 Essential (primary) hypertension: Secondary | ICD-10-CM

## 2016-12-16 DIAGNOSIS — E7849 Other hyperlipidemia: Secondary | ICD-10-CM

## 2016-12-20 ENCOUNTER — Other Ambulatory Visit: Payer: Self-pay

## 2016-12-23 ENCOUNTER — Ambulatory Visit: Payer: Self-pay | Admitting: Internal Medicine

## 2017-01-12 NOTE — Progress Notes (Signed)
Cardiology Office Note:    Date:  01/16/2017   ID:  [name], DOB 08/28/1945, MRN [id number]  PCP:  Gayland Curry, DO  Cardiologist:  Dr. [name]   Electrophysiologist:  n/a GI:  Dr. [name]  Referring MD: Gayland Curry, DO   No chief complaint on file.   History of Present Illness:    [name] is a 71 y.o. female with a hx of CAD s/p CABG, PVCs, HTN, HL, DM2, GERD.  She initially presented with a NSTEMI in 9/17.  She had 3 V CAD on LHC with EF 45-50.  She underwent CABG with L-LAD, S-RI, S-RCA.[name] Kitchen  She then saw Dr. Roxan Hockey for follow up and was noted to have abdominal distention/ascites.  Her Lasix was increased to 40 bid.  A follow up echocardiogram demonstrated EF 08-65, mild diastolic dysfunction, L pleural effusion, PASP 38.  There was no pericardial effusion.  Abdominal CT demonstrated findings consistent with cirrhosis, gastroesophageal perigastric varices and moderate to large abdominopelvic ascites.  She was referred to gastroenterology. She saw Dr. [name] 03/09/16. She was placed back on Lasix 40 twice a day and started on spironolactone 100 twice a day.  She underwent paracentesis with removal of 4 L of fluid.  Cytology was neg for malignant cells.   She denies syncope, orthopnea, PND, edema. She denies recurrent abdominal distention.    Prior CV and other studies that were reviewed today include:    Echo 03/08/16 Mild concentric LVH, EF 60-65, normal wall motion, grade 1 diastolic dysfunction, very mild aortic stenosis suggested by gradients (mean 12), mild AI, MAC, mild MR, mild to moderate LAE, PASP 38, left pleural effusion  Abdominal CT 03/06/16 IMPRESSION: Cirrhosis.  No focal hepatic lesion is seen. Moderate to large abdominopelvic ascites. Gastroesophageal and perigastric varices.  Portal vein is patent.  Carotid US 9/17 Bilateral ICA 1-39  ABIs 9/17 Normal  LHC 9/17 Acute coronary syndrome with recent total occlusion of the  dominant RCA, high-grade obstruction in the proximal LAD which Collateralizes the RCA, and high-grade obstruction and a moderate size ramus intermedius branch. Left ventricular systolic dysfunction with inferobasal moderate hypokinesis and EF of 45-50%. Upper normal LV EDP.   Past Medical History:  Diagnosis Date  . Allergy   . Arthritis    neck  . Cataract    bilateral - MD monitoring cataracts  . CHF (congestive heart failure) ([place])   . Chronic kidney disease, stage I    DR OTTELIN  HX UTIS  . Cirrhosis ([place])   . Cramp of limb   . Diabetes mellitus   . Dysphagia, unspecified(787.20)   . Dysuria   . Epistaxis   . GERD (gastroesophageal reflux disease)   . Heart murmur    NO CARDIOLOGIST  DX FOR YEARS ASYMPTOMATIC  . Lumbago   . Neoplasm of uncertain behavior of skin   . Nonspecific elevation of levels of transaminase or lactic acid dehydrogenase (LDH)   . Osteoarthrosis, unspecified whether generalized or localized, unspecified site   . Other and unspecified hyperlipidemia    diet controlled  . Pain in joint, shoulder region   . Paresthesias 04/01/2015  . Postablative ovarian failure   . Trochanteric bursitis of left hip 12/15/2015  . Type 2 diabetes mellitus without complication ([place])   . Unspecified essential hypertension    no meds    Past Surgical History:  Procedure Laterality Date  . CARDIAC CATHETERIZATION N/A 01/27/2016   Procedure: Left Heart Cath and Coronary  Angiography;  Surgeon: Belva Crome, MD;  Location: [facility];  Service: Cardiovascular;  Laterality: N/A;  . COLONOSCOPY  2012   [place] - polyp  . COLONOSCOPY WITH PROPOFOL N/A 07/07/2016   Procedure: COLONOSCOPY WITH PROPOFOL;  Surgeon: [name], MD;  [facility];  Service: Endoscopy;  Laterality: N/A;  . CORONARY ARTERY BYPASS GRAFT N/A 01/28/2016   Procedure: CORONARY ARTERY BYPASS GRAFTING (CABG) x 3 USING RIGHT LEG GREATER SAPHENOUS VEIN GRAFT;  Surgeon: Melrose Nakayama,  MD;  Location: [facility];  Service: Open Heart Surgery;  Laterality: N/A;  . ENDOVEIN HARVEST OF GREATER SAPHENOUS VEIN Right 01/28/2016   Procedure: ENDOVEIN HARVEST OF GREATER SAPHENOUS VEIN;  Surgeon: Melrose Nakayama, MD;  Location: [facility];  Service: Open Heart Surgery;  Laterality: Right;  . ESOPHAGOGASTRODUODENOSCOPY (EGD) WITH PROPOFOL N/A 07/07/2016   Procedure: ESOPHAGOGASTRODUODENOSCOPY (EGD) WITH PROPOFOL;  Surgeon: [name], MD;  [facility];  Service: Endoscopy;  Laterality: N/A;  . MAXIMUM ACCESS (MAS)POSTERIOR LUMBAR INTERBODY FUSION (PLIF) 1 LEVEL Left 06/10/2015   Procedure: FOR MAXIMUM ACCESS (MAS) POSTERIOR LUMBAR INTERBODY FUSION (PLIF) LUMBAR THREE-FOUR EXTRAFORAMINAL MICRODISCECTOMY LUMBAR FIVE-SACRAL ONE LEFT;  Surgeon: Eustace Moore, MD;  Location: [facility];  Service: Neurosurgery;  Laterality: Left;  . TEE WITHOUT CARDIOVERSION N/A 01/28/2016   Procedure: TRANSESOPHAGEAL ECHOCARDIOGRAM (TEE);  Surgeon: Melrose Nakayama, MD;  Location: [facility];  Service: Open Heart Surgery;  Laterality: N/A;  . TUBAL LIGATION  1982   Dr Connye Burkitt  . UPPER GASTROINTESTINAL ENDOSCOPY    . VAGINAL HYSTERECTOMY  1997   Dr Rande Lawman    Current Medications: Current Meds  Medication Sig  . acetaminophen (TYLENOL) 325 MG tablet Take 2 tablets (650 mg total) by mouth every 6 (six) hours as needed for mild pain.  . Aromatic Inhalants (VICKS VAPOR IN) Vicks Vapor Rub apply small amount to outside of nose to help breathing  . aspirin EC 325 MG EC tablet Take 1 tablet (325 mg total) by mouth daily.  . Biotin 10000 MCG TABS Take 10,000 mcg by mouth every morning.  . calcium carbonate (OS-CAL) 600 MG TABS Take 600 mg by mouth 2 (two) times daily with a meal.    . cholecalciferol (VITAMIN D) 1000 UNITS tablet Take 2,000 Units by mouth daily.   . ciprofloxacin (CIPRO) 250 MG tablet Take 1 tablet (250 mg total) by mouth 2 (two) times daily as needed (to prevent UTIs). Take one tablet after  intercourse to prevent urinary tract infection  . Cyanocobalamin (VITAMIN B 12 PO) Take 1,000 mcg by mouth daily.    . fish oil-omega-3 fatty acids 1000 MG capsule Take 1 g by mouth daily.    . furosemide (LASIX) 40 MG tablet Take 40 mg by mouth daily.  [name] Kitchen LEVEMIR 100 UNIT/ML injection ADMINISTER 45 UNITS UNDER THE SKIN EVERY NIGHT AT BEDTIME  . MAGNESIUM PO Take 500 mg by mouth 2 (two) times daily in the am and at bedtime..  . metFORMIN (GLUCOPHAGE) 1000 MG tablet TAKE 1 TABLET BY MOUTH TWICE DAILY TO TREAT DIABETES  . Multiple Vitamins-Minerals (MULTIVITAMIN WITH MINERALS) tablet Take 1 tablet by mouth daily.    . nadolol (CORGARD) 80 MG tablet Take 1 tablet (80 mg total) by mouth daily.  [name] Kitchen omeprazole (PRILOSEC) 20 MG capsule TAKE ONE CAPSULE BY MOUTH DAILY  . Polyethyl Glycol-Propyl Glycol (SYSTANE OP) Place 1 drop into both eyes 2 (two) times daily.  . potassium chloride SA (K-DUR,KLOR-CON) 20  MEQ tablet TAKE 1 TABLET BY MOUTH DAILY FOR POTASSIUM SUPPLEMENT  . Probiotic Product (PROBIOTIC DAILY PO) Take 1 capsule by mouth daily. Digestive Advantage Probiotic  . spironolactone (ALDACTONE) 100 MG tablet Take 1 tablet (100 mg total) by mouth daily.     Allergies:   Kiwi extract; Tdap [diphth-acell pertussis-tetanus]; Statins; Latex; and Tramadol   Social History   Social History  . Marital status: Married    Spouse name: N/A  . Number of children: 6  . Years of education: N/A   Occupational History  . retired    Social History Main Topics  . Smoking status: Never Smoker  . Smokeless tobacco: Never Used  . Alcohol use No  . Drug use: No  . Sexual activity: Yes    Partners: Male    Birth control/ protection: Post-menopausal, Surgical     Comment: Hysterectomy   Other Topics Concern  . Not on file   Social History Narrative  . No narrative on file     Family History:  The patient's family history includes Arthritis in her brother and sister; Breast cancer in her maternal  aunt and mother; Heart disease in her maternal grandfather, maternal grandmother, paternal grandfather, and paternal grandmother; Liver cancer in her brother; Lung cancer in her father.   ROS:   Please see the history of present illness.    Review of Systems  Constitution: Positive for chills.  Respiratory: Positive for cough.   Neurological: Positive for dizziness.  Psychiatric/Behavioral: The patient is nervous/anxious.    All other systems reviewed and are negative.   EKGs/Labs/Other Test Reviewed:    EKG:   04/05/16 NSR, HR 84, NSSTTW changes, QTc 479, PVC, similar to prior tracings. 01/16/17 SR nonspecific ST/T wave changes   Recent Labs: 03/06/2016: B Natriuretic Peptide 307.8 05/08/2016: Magnesium 1.8 09/02/2016: ALT 44; BUN 16; Creatinine, Ser 0.85; Hemoglobin 13.6; Platelets 117.0; Potassium 4.2; Sodium 136   Recent Lipid Panel    Component Value Date/Time   CHOL 185 07/08/2016 1045   TRIG 67 07/08/2016 1045   HDL 55 07/08/2016 1045   CHOLHDL 3.4 07/08/2016 1045   CHOLHDL 2.8 04/15/2016 1002   VLDL 13 04/15/2016 1002   LDLCALC 117 (H) 07/08/2016 1045     Physical Exam:    VS:  BP 110/60   Pulse (!) 57   Ht _0  (1.6 m)   Wt 143 lb 1.9 oz (64.9 kg)   SpO2 97%   BMI 25.35 kg/m     Wt Readings from Last 3 Encounters:  01/16/17 143 lb 1.9 oz (64.9 kg)  09/02/16 138 lb 6 oz (62.8 kg)  08/23/16 138 lb (62.6 kg)     Physical Exam  Constitutional: She is oriented to person, place, and time. She appears well-developed and well-nourished. No distress.  HENT:  Head: Normocephalic and atraumatic.  Eyes: No scleral icterus.  Neck: No JVD present.  Cardiovascular: Normal rate and regular rhythm.   Murmur heard.  Low-pitched systolic murmur is present with a grade of 2/6  at the upper left sternal border Pulmonary/Chest: Effort normal. She has no wheezes. She has no rales.  Abdominal: Soft. There is no tenderness.  Musculoskeletal: She exhibits no edema.    Neurological: She is alert and oriented to person, place, and time.  Skin: Skin is warm and dry.  Psychiatric: She has a normal mood and affect.    ASSESSMENT:    No diagnosis found. PLAN:    In order of problems listed  above:  1. CAD - s/p NSTEMI followed by CABG in 9/17.       -  Continue ASA 81 mg   -  Continue beta-blocker.  2. Cirrhosis - Question if 2/2 to NASH.  She is followed by GI.  She is now on Lasix and Spironolactone.  She is s/p paracentesis. Beta blocker changed to nadolol for ascites and to prevent variceal bleeding   3. HL - Her statin was held in the setting of newly dx cirrhosis.  I will check with Dr. [name] if ok to resume.  4. HTN - BP controlled.   5. PVCs - EF is normal now post MI/CABG.  No need to pursue Holter.  6. Paresthesias - She notes bilateral thigh numbness.  She had lumbar back surgery with Dr. Ronnald Ramp in 2/17.  I have asked her to contact him with her symptoms.    [name], MD

## 2017-01-16 ENCOUNTER — Ambulatory Visit (INDEPENDENT_AMBULATORY_CARE_PROVIDER_SITE_OTHER): Payer: Medicare Other | Admitting: Cardiovascular Disease

## 2017-01-16 VITALS — BP 110/60 | HR 57 | Ht 63.0 in | Wt 143.1 lb

## 2017-01-16 DIAGNOSIS — I10 Essential (primary) hypertension: Secondary | ICD-10-CM | POA: Diagnosis not present

## 2017-01-16 DIAGNOSIS — I2510 Atherosclerotic heart disease of native coronary artery without angina pectoris: Secondary | ICD-10-CM | POA: Diagnosis not present

## 2017-01-16 MED ORDER — ASPIRIN EC 81 MG PO TBEC: 81.0000 mg | DELAYED_RELEASE_TABLET | Freq: Every day | ORAL | 3 refills | Status: DC

## 2017-01-16 NOTE — Patient Instructions (Addendum)
Medication Instructions:  Your physician has recommended you make the following change in your medication:  1-Decrease Aspirin 81 mg by mouth daily.   Labwork: NONE  Testing/Procedures: NONE  Follow-Up: Your physician wants you to follow-up in: 6 months with Dr. Johnsie Cancel. You will receive a reminder letter in the mail two months in advance. If you don't receive a letter, please call our office to schedule the follow-up appointment.   If you need a refill on your cardiac medications before your next appointment, please call your pharmacy.

## 2017-01-23 ENCOUNTER — Other Ambulatory Visit: Payer: Medicare Other

## 2017-01-23 DIAGNOSIS — E784 Other hyperlipidemia: Secondary | ICD-10-CM | POA: Diagnosis not present

## 2017-01-23 DIAGNOSIS — E1165 Type 2 diabetes mellitus with hyperglycemia: Secondary | ICD-10-CM

## 2017-01-23 DIAGNOSIS — I10 Essential (primary) hypertension: Secondary | ICD-10-CM | POA: Diagnosis not present

## 2017-01-23 DIAGNOSIS — E7849 Other hyperlipidemia: Secondary | ICD-10-CM

## 2017-01-23 DIAGNOSIS — Z794 Long term (current) use of insulin: Secondary | ICD-10-CM | POA: Diagnosis not present

## 2017-01-24 LAB — COMPLETE METABOLIC PANEL WITH GFR
AG Ratio: 1 (calc) (ref 1.0–2.5)
ALT: 36 U/L — ABNORMAL HIGH (ref 6–29)
AST: 37 U/L — ABNORMAL HIGH (ref 10–35)
Albumin: 3.6 g/dL (ref 3.6–5.1)
Alkaline phosphatase (APISO): 85 U/L (ref 33–130)
BUN: 17 mg/dL (ref 7–25)
CO2: 31 mmol/L (ref 20–32)
Calcium: 9.6 mg/dL (ref 8.6–10.4)
Chloride: 101 mmol/L (ref 98–110)
Creat: 0.82 mg/dL (ref 0.60–0.93)
GFR, Est African American: 84 mL/min/{1.73_m2} (ref >=60)
GFR, Est Non African American: 72 mL/min/{1.73_m2} (ref >=60)
Globulin: 3.6 g/dL (calc) (ref 1.9–3.7)
Glucose, Bld: 118 mg/dL — ABNORMAL HIGH (ref 65–99)
Potassium: 4.4 mmol/L (ref 3.5–5.3)
Sodium: 139 mmol/L (ref 135–146)
Total Bilirubin: 0.8 mg/dL (ref 0.2–1.2)
Total Protein: 7.2 g/dL (ref 6.1–8.1)

## 2017-01-24 LAB — LIPID PANEL
Cholesterol: 195 mg/dL (ref <200)
HDL: 54 mg/dL (ref >50)
LDL Cholesterol (Calc): 123 mg/dL (calc) — ABNORMAL HIGH
Non-HDL Cholesterol (Calc): 141 mg/dL (calc) — ABNORMAL HIGH (ref <130)
Total CHOL/HDL Ratio: 3.6 (calc) (ref <5.0)
Triglycerides: 79 mg/dL (ref <150)

## 2017-01-24 LAB — HEMOGLOBIN A1C
Hgb A1c MFr Bld: 10.7 % of total Hgb — ABNORMAL HIGH (ref <5.7)
Mean Plasma Glucose: 260 (calc)
eAG (mmol/L): 14.4 (calc)

## 2017-01-25 ENCOUNTER — Other Ambulatory Visit: Payer: Self-pay | Admitting: Gastroenterology

## 2017-01-26 ENCOUNTER — Ambulatory Visit (INDEPENDENT_AMBULATORY_CARE_PROVIDER_SITE_OTHER): Payer: Medicare Other | Admitting: Internal Medicine

## 2017-01-26 ENCOUNTER — Encounter: Payer: Self-pay | Admitting: Internal Medicine

## 2017-01-26 VITALS — BP 120/70 | HR 60 | Temp 98.1°F | Wt 140.0 lb

## 2017-01-26 DIAGNOSIS — Z794 Long term (current) use of insulin: Secondary | ICD-10-CM | POA: Diagnosis not present

## 2017-01-26 DIAGNOSIS — E7800 Pure hypercholesterolemia, unspecified: Secondary | ICD-10-CM | POA: Diagnosis not present

## 2017-01-26 DIAGNOSIS — I10 Essential (primary) hypertension: Secondary | ICD-10-CM

## 2017-01-26 DIAGNOSIS — I2510 Atherosclerotic heart disease of native coronary artery without angina pectoris: Secondary | ICD-10-CM | POA: Diagnosis not present

## 2017-01-26 DIAGNOSIS — K7460 Unspecified cirrhosis of liver: Secondary | ICD-10-CM

## 2017-01-26 DIAGNOSIS — Z23 Encounter for immunization: Secondary | ICD-10-CM | POA: Diagnosis not present

## 2017-01-26 DIAGNOSIS — E1165 Type 2 diabetes mellitus with hyperglycemia: Secondary | ICD-10-CM | POA: Diagnosis not present

## 2017-01-26 DIAGNOSIS — IMO0001 Reserved for inherently not codable concepts without codable children: Secondary | ICD-10-CM

## 2017-01-26 MED ORDER — DULAGLUTIDE 1.5 MG/0.5ML ~~LOC~~ SOAJ: 0.5000 mL | SUBCUTANEOUS | 0 refills | Status: DC

## 2017-01-26 NOTE — Patient Instructions (Addendum)
Begin Trulicity tomorrow morning and use weekly.   Start checking your sugar before meals.   See C[place] 10/8  We will be back in touch about repatha.

## 2017-01-26 NOTE — Progress Notes (Signed)
Location:  [facility] clinic Provider:  Anora Schwenke L. Mariea Clonts, D.O., C.M.D.  Code Status: full code Goals of Care:  Advanced Directives 01/26/2017  Does Patient Have a Medical Advance Directive? No  Does patient want to make changes to medical advance directive? No - Patient declined  Would patient like information on creating a medical advance directive? -   Chief Complaint  Patient presents with  . Medical Management of Chronic Issues    99mh follow-up    HPI: Patient is a 71y.o. female seen today for medical management of chronic diseases.    Got flu shot today.  Has a lot of family stress.  She just had a cardiology appt with Dr. NJohnsie Cancel  Forgot to ask him about cholesterol mgt.  We discussed repatha therapy and she's agreeable to trying it.    CBGs elevated.  Is watching what she eats.  Does walk when she can.  Checks only fasting in am.  They have been 80s to 100s.  Postprandial apparently much higher.  Takes levemir 45 units each night and 10073mmetformin bid before meals.  Her insurance quit covering her lantus at one point and that's why she's on levemir.  Says it may have been after her last baby who was 9 lbs.    Past Medical History:  Diagnosis Date  . Allergy   . Arthritis    neck  . Cataract    bilateral - MD monitoring cataracts  . CHF (congestive heart failure) ([facility]  . Chronic kidney disease, stage I    DR OTTELIN  HX UTIS  . Cirrhosis ([place]  . Cramp of limb   . Diabetes mellitus   . Dysphagia, unspecified(787.20)   . Dysuria   . Epistaxis   . GERD (gastroesophageal reflux disease)   . Heart murmur    NO CARDIOLOGIST  DX FOR YEARS ASYMPTOMATIC  . Lumbago   . Neoplasm of uncertain behavior of skin   . Nonspecific elevation of levels of transaminase or lactic acid dehydrogenase (LDH)   . Osteoarthrosis, unspecified whether generalized or localized, unspecified site   . Other and unspecified hyperlipidemia    diet controlled  . Pain in joint, shoulder region    . Paresthesias 04/01/2015  . Postablative ovarian failure   . Trochanteric bursitis of left hip 12/15/2015  . Type 2 diabetes mellitus without complication ([facility]  . Unspecified essential hypertension    no meds    Past Surgical History:  Procedure Laterality Date  . CARDIAC CATHETERIZATION N/A 01/27/2016   Procedure: Left Heart Cath and Coronary Angiography;  Surgeon: HeBelva CromeMD;  [facility];  Service: Cardiovascular;  Laterality: N/A;  . COLONOSCOPY  2012   Jacobs - polyp  . COLONOSCOPY WITH PROPOFOL N/A 07/07/2016   Procedure: COLONOSCOPY WITH PROPOFOL;  Surgeon: DaMilus BanisterMD;  Location: [facility];  Service: Endoscopy;  Laterality: N/A;  . CORONARY ARTERY BYPASS GRAFT N/A 01/28/2016   Procedure: CORONARY ARTERY BYPASS GRAFTING (CABG) x 3 USING RIGHT LEG GREATER SAPHENOUS VEIN GRAFT;  Surgeon: StMelrose NakayamaMD;  [facility] Service: Open Heart Surgery;  Laterality: N/A;  . ENDOVEIN HARVEST OF GREATER SAPHENOUS VEIN Right 01/28/2016   Procedure: ENDOVEIN HARVEST OF GREATER SAPHENOUS VEIN;  Surgeon: StMelrose NakayamaMD;  [facility]: [facility] Service: Open Heart Surgery;  Laterality: Right;  . ESOPHAGOGASTRODUODENOSCOPY (EGD) WITH PROPOFOL N/A 07/07/2016   Procedure: ESOPHAGOGASTRODUODENOSCOPY (EGD) WITH PROPOFOL;  Surgeon: DaMelene Plan  [name], MD;  Location: [facility];  Service: Endoscopy;  Laterality: N/A;  . MAXIMUM ACCESS (MAS)POSTERIOR LUMBAR INTERBODY FUSION (PLIF) 1 LEVEL Left 06/10/2015   Procedure: FOR MAXIMUM ACCESS (MAS) POSTERIOR LUMBAR INTERBODY FUSION (PLIF) LUMBAR THREE-FOUR EXTRAFORAMINAL MICRODISCECTOMY LUMBAR FIVE-SACRAL ONE LEFT;  Surgeon: Eustace Moore, MD;  Location: [facility];  Service: Neurosurgery;  Laterality: Left;  . TEE WITHOUT CARDIOVERSION N/A 01/28/2016   Procedure: TRANSESOPHAGEAL ECHOCARDIOGRAM (TEE);  Surgeon: Melrose Nakayama, MD;  Location: [place];  Service: Open Heart Surgery;  Laterality: N/A;  . TUBAL  LIGATION  1982   Dr Connye Burkitt  . UPPER GASTROINTESTINAL ENDOSCOPY    . VAGINAL HYSTERECTOMY  1997   Dr Rande Lawman    Allergies  Allergen Reactions  . Kiwi Extract Anaphylaxis  . Tdap [Diphth-Acell Pertussis-Tetanus] Swelling and Other (See Comments)    Swelling at injection site, gets very hot  . Statins     RHABDOMYOLYSIS  . Latex Itching, Dermatitis and Rash  . Tramadol Nausea And Vomiting    Outpatient Encounter Prescriptions as of 01/26/2017  Medication Sig  . acetaminophen (TYLENOL) 325 MG tablet Take 2 tablets (650 mg total) by mouth every 6 (six) hours as needed for mild pain.  . Aromatic Inhalants (VICKS VAPOR IN) Vicks Vapor Rub apply small amount to outside of nose to help breathing  . aspirin EC 81 MG tablet Take 1 tablet (81 mg total) by mouth daily.  . Biotin 10000 MCG TABS Take 10,000 mcg by mouth every morning.  . calcium carbonate (OS-CAL) 600 MG TABS Take 600 mg by mouth 2 (two) times daily with a meal.    . cholecalciferol (VITAMIN D) 1000 UNITS tablet Take 2,000 Units by mouth daily.   . Cyanocobalamin (VITAMIN B 12 PO) Take 1,000 mcg by mouth daily.    . fish oil-omega-3 fatty acids 1000 MG capsule Take 1 g by mouth daily.    . furosemide (LASIX) 40 MG tablet Take 40 mg by mouth daily.  [name] Kitchen LEVEMIR 100 UNIT/ML injection ADMINISTER 45 UNITS UNDER THE SKIN EVERY NIGHT AT BEDTIME  . MAGNESIUM PO Take 500 mg by mouth 2 (two) times daily in the am and at bedtime..  . metFORMIN (GLUCOPHAGE) 1000 MG tablet TAKE 1 TABLET BY MOUTH TWICE DAILY TO TREAT DIABETES  . Multiple Vitamins-Minerals (MULTIVITAMIN WITH MINERALS) tablet Take 1 tablet by mouth daily.    . nadolol (CORGARD) 80 MG tablet TAKE 1 TABLET(80 MG) BY MOUTH DAILY  . omeprazole (PRILOSEC) 20 MG capsule TAKE ONE CAPSULE BY MOUTH DAILY  . Polyethyl Glycol-Propyl Glycol (SYSTANE OP) Place 1 drop into both eyes 2 (two) times daily.  . potassium chloride SA (K-DUR,KLOR-CON) 20 MEQ tablet TAKE 1 TABLET BY MOUTH DAILY FOR  POTASSIUM SUPPLEMENT  . Probiotic Product (PROBIOTIC DAILY PO) Take 1 capsule by mouth daily. Digestive Advantage Probiotic  . spironolactone (ALDACTONE) 100 MG tablet Take 1 tablet (100 mg total) by mouth daily.  . [DISCONTINUED] ciprofloxacin (CIPRO) 250 MG tablet Take 1 tablet (250 mg total) by mouth 2 (two) times daily as needed (to prevent UTIs). Take one tablet after intercourse to prevent urinary tract infection   No facility-administered encounter medications on file as of 01/26/2017.     Review of Systems:  Review of Systems  Constitutional: Negative for chills and fever.  HENT: Positive for congestion.   Eyes: Negative for redness.  Respiratory: Positive for cough. Negative for shortness of breath.        Hoarseness  Cardiovascular: Negative  for chest pain, palpitations and leg swelling.  Gastrointestinal: Negative for abdominal pain.       Cirrhosis with ascites and esophageal varices  Genitourinary: Negative for dysuria.  Musculoskeletal: Negative for falls.  Skin: Negative for itching and rash.  Neurological: Negative for dizziness and loss of consciousness.  Endo/Heme/Allergies:       Uncontrolled diabetes  Psychiatric/Behavioral: Negative for memory loss.    Health Maintenance  Topic Date Due  . FOOT EXAM  03/30/2016  . INFLUENZA VACCINE  12/07/2016  . URINE MICROALBUMIN  06/16/2017  . HEMOGLOBIN A1C  07/23/2017  . OPHTHALMOLOGY EXAM  08/04/2017  . MAMMOGRAM  09/16/2018  . COLONOSCOPY  07/07/2021  . DEXA SCAN  Completed  . Hepatitis C Screening  Completed  . PNA vac Low Risk Adult  Completed  . TETANUS/TDAP  Excluded    Physical Exam: Vitals:   01/26/17 1106  BP: 120/70  Pulse: 60  Temp: 98.1 F (36.7 C)  TempSrc: Oral  SpO2: 99%  Weight: 140 lb (63.5 kg)   Body mass index is 24.8 kg/m. Physical Exam  Constitutional: She is oriented to person, place, and time. No distress.  Chronically ill appearing white female   Cardiovascular: Normal rate and  regular rhythm.   Murmur heard. Pulmonary/Chest: Effort normal and breath sounds normal. No respiratory distress.  Abdominal: Bowel sounds are normal.  Musculoskeletal: Normal range of motion.  Neurological: She is alert and oriented to person, place, and time.  Skin: Skin is warm and dry. Capillary refill takes less than 2 seconds.  Slightly jaundice  Psychiatric: She has a normal mood and affect.    Labs reviewed: Basic Metabolic Panel:  Recent Labs  01/29/16 1700  04/05/16 1009  05/08/16 0748  08/19/16 0903 09/02/16 0926 01/23/17 0923  NA  --   < > 134*  < > 133*  < > 139 136 139  K  --   < > 4.1  < > 3.9  < > 4.3 4.2 4.4  CL  --   < > 98  < > 100*  < > 103 99 101  CO2  --   < > 25  < > 25  < > 29 27 31   GLUCOSE  --   < > 317*  < > 196*  < > 88 185* 118*  BUN  --   < > 16  < > 19  < > 16 16 17   CREATININE 0.67  < > 0.80  < > 0.80  < > 0.88 0.85 0.82  CALCIUM  --   < > 9.8  < > 9.0  < > 10.0 10.4 9.6  MG 2.0  --  1.8  --  1.8  --   --   --   --   PHOS  --   --   --   --  2.7  --   --   --   --   < > = values in this interval not displayed. Liver Function Tests:  Recent Labs  07/15/16 0921 08/19/16 0903 09/02/16 0926 01/23/17 0923  AST 51* 49* 50* 37*  ALT 39* 41* 44* 36*  ALKPHOS 66 70 77  --   BILITOT 0.5 0.6 0.7 0.8  PROT 7.1 7.0 7.6 7.2  ALBUMIN 3.4* 3.3* 3.6  --     Recent Labs  03/06/16 1301  LIPASE 27   No results for input(s): AMMONIA in the last 8760 hours. CBC:  Recent Labs  06/24/16 1014  08/19/16 0903 09/02/16 0926  WBC 6.8 4.3 5.5  NEUTROABS 4.5 2,193 2.9  HGB 11.9* 12.6 13.6  HCT 36.5 38.5 41.2  MCV 81.5 88.3 89.9  PLT 159.0 139* 117.0*   Lipid Panel:  Recent Labs  04/15/16 1002 07/08/16 1045 01/23/17 0923  CHOL 105 185 195  HDL 38* 55 54  LDLCALC 54 117*  --   TRIG 67 67 79  CHOLHDL 2.8 3.4 3.6   Lab Results  Component Value Date   HGBA1C 10.7 (H) 01/23/2017    Assessment/Plan 1. Need for immunization against  influenza - Flu vaccine HIGH DOSE PF (Fluzone High dose)  2. Cirrhosis of liver without ascites, unspecified hepatic cirrhosis type (HCC) -cont spironolactone, lasix, nadolol, following with GI -no signs of asterixis or increased ascites  3. Coronary artery disease involving native coronary artery of native heart without angina pectoris -stable, but needs LDL <70 -cont baby asa, beta blocker, try to get her on repatha due to statin intolerance and ascvd  4. Pure hypercholesterolemia -had statin rhabdo and myopathy with lipitor 86m and has been off meds for cholesterol since with LDL trending up and worsening diabetic controll, too  5. Essential hypertension -bp at goal with current regimen so cont same  6. Uncontrolled type 2 diabetes mellitus without complication, with long-term current use of insulin (HCC) -control worsening, will try her on trulicity weekly--two pens given for 2 wks, her husband used victoza and we also reviewed how to use the pen -she is willing to try it, take cbgs before meals -cont metformin and levemir also -then f/u with Cathey 10/1 for f/u on cbgs to see if there's any effect of the trulicity or if she simply needs mealtime insulin added to her levemir and stop metformin   Labs/tests ordered:   Orders Placed This Encounter  Procedures  . Flu vaccine HIGH DOSE PF (Fluzone High dose)    Next appt:  05/29/2017 med mgt, see Cathey 10/8 to f/u diabetes  Jonella Redditt L. Maurizio Geno, D.O. [facility] [address] [place] [address]Cell Phone (Mon-Fri 8am-5pm):  [phone] Call:  [phone]& follow prompts after 5pm & weekends Office Phone:  [phone] Fax:  [phone]

## 2017-01-27 ENCOUNTER — Telehealth: Payer: Self-pay | Admitting: Gastroenterology

## 2017-01-27 NOTE — Telephone Encounter (Signed)
The pt has been advised of the recommendation

## 2017-01-27 NOTE — Telephone Encounter (Signed)
Dr [name] the pt would like to know if it is ok to start trulicity for her diabetes.  She is worried about potential liver problems.  Please advise

## 2017-01-27 NOTE — Telephone Encounter (Signed)
That should be safe

## 2017-01-30 ENCOUNTER — Other Ambulatory Visit: Payer: Self-pay | Admitting: *Deleted

## 2017-01-31 ENCOUNTER — Other Ambulatory Visit: Payer: Self-pay | Admitting: Internal Medicine

## 2017-02-01 ENCOUNTER — Other Ambulatory Visit: Payer: Self-pay | Admitting: Internal Medicine

## 2017-02-03 ENCOUNTER — Telehealth: Payer: Self-pay

## 2017-02-03 NOTE — Telephone Encounter (Signed)
A fax was received from [facility] stating that patient has been approved for [employer], effective 64-31-42 until 05-08-17.   I spoke with patient and she asked that the approval letter be faxed to the [employer] patient assistance program. Forms were completed and faxed

## 2017-02-06 ENCOUNTER — Encounter: Payer: Self-pay | Admitting: Pharmacotherapy

## 2017-02-06 ENCOUNTER — Ambulatory Visit (INDEPENDENT_AMBULATORY_CARE_PROVIDER_SITE_OTHER): Payer: Medicare Other | Admitting: Pharmacotherapy

## 2017-02-06 VITALS — BP 122/86 | HR 64 | Temp 97.9°F | Resp 16 | Ht 63.0 in | Wt 135.6 lb

## 2017-02-06 DIAGNOSIS — I2510 Atherosclerotic heart disease of native coronary artery without angina pectoris: Secondary | ICD-10-CM | POA: Diagnosis not present

## 2017-02-06 DIAGNOSIS — E1165 Type 2 diabetes mellitus with hyperglycemia: Secondary | ICD-10-CM | POA: Diagnosis not present

## 2017-02-06 DIAGNOSIS — IMO0001 Reserved for inherently not codable concepts without codable children: Secondary | ICD-10-CM

## 2017-02-06 DIAGNOSIS — Z794 Long term (current) use of insulin: Secondary | ICD-10-CM | POA: Diagnosis not present

## 2017-02-06 MED ORDER — INSULIN DETEMIR 100 UNIT/ML ~~LOC~~ SOLN: 40.0000 [IU] | Freq: Every day | SUBCUTANEOUS | 5 refills | Status: DC

## 2017-02-06 MED ORDER — INSULIN PEN NEEDLE 32G X 4 MM MISC: 1.0000 | Freq: Three times a day (TID) | 12 refills | Status: DC

## 2017-02-06 MED ORDER — INSULIN ASPART 100 UNIT/ML FLEXPEN: 5.0000 [IU] | PEN_INJECTOR | Freq: Three times a day (TID) | SUBCUTANEOUS | 11 refills | Status: DC

## 2017-02-06 NOTE — Progress Notes (Signed)
  Subjective:    [name] is a 71 y.o.white female who presents for follow-up of Type 2 diabetes mellitus.   Long standing history of DM - since birth of last child.  Currently on Levemir, Trulicity, and Metformin.  Trulicity added 2 weeks ago. Last A1C: 10.7% (01/23/17)  Here today to see if Trulicity is accomplishing post-prandial control, or dose she need mealtime insulin.  Her BG this morning was 182m/dl. She says her BG has been "in the upper hundreds" NO hypoglycemia  Trying to make healthy food choices. Walking for exercise. Some blurry vision.  Wears corrective lenses. No peripheral edema. Some tingling  In feet.  No open areas. Nocturia 3 times per night Some dysuria Staying well hydrated with water.    Review of Systems A comprehensive review of systems was negative except for: Eyes: positive for contacts/glasses Gastrointestinal: positive for diarrhea Genitourinary: positive for dysuria and nocturia    Objective:    BP 122/86   Pulse 64   Temp 97.9 F (36.6 C) (Oral)   Resp 16   Ht 5' 3"  (1.6 m)   Wt 135 lb 9.6 oz (61.5 kg)   SpO2 95%   BMI 24.02 kg/m   General:  alert, cooperative and no distress  Oropharynx: normal findings: lips normal without lesions and gums healthy   Eyes:  negative findings: lids and lashes normal and conjunctivae and sclerae normal   Ears:  external ears normal.  Hearing aid in place        Lung: clear to auscultation bilaterally  Heart:  regular rate and rhythm     Extremities: extremities normal, atraumatic, no cyanosis or edema  Skin: warm and dry, no hyperpigmentation, vitiligo, or suspicious lesions     Neuro: mental status, speech normal, alert and oriented x3 and gait and station normal   Lab Review Glucose, Bld (mg/dL)  Date Value  01/23/2017 118 (H)  09/02/2016 185 (H)  08/19/2016 88   CO2  Date Value  01/23/2017 31 mmol/L  09/02/2016 27 mEq/L  08/19/2016 29 mmol/L   BUN (mg/dL)  Date Value   01/23/2017 17  09/02/2016 16  08/19/2016 16  07/24/2015 11  03/27/2015 15  11/07/2014 15   Creat (mg/dL)  Date Value  01/23/2017 0.82  08/19/2016 0.88  06/16/2016 1.02 (H)   Creatinine, Ser (mg/dL)  Date Value  09/02/2016 0.85  07/15/2016 0.93  06/24/2016 1.03       Assessment:    Diabetes Mellitus type II, under poor control.  Does not appear to be responding to Trulicity.   Plan:    1.  Rx changes: stop Trulicity and Metformin.  Start Novolog 5 units with each meal.  Decrease Levemir 40 units daily.  2.  Discussed progression of diabetes and her body's decreased ability to make enough insulin. 3.  Counseled on hypoglycemia. 4.  Counseled on nutrition goals. 5.  Exercise goal is 30-45 minutes 5 x week. 6.  RTC in 1 month to follow up BG.

## 2017-02-06 NOTE — Patient Instructions (Signed)
Stop Trulicity  Stop Metformin  Start Novolog 5 units with each meal  Decrease Levemir 40 units daily

## 2017-03-09 ENCOUNTER — Other Ambulatory Visit: Payer: Self-pay | Admitting: Gastroenterology

## 2017-03-13 ENCOUNTER — Ambulatory Visit (INDEPENDENT_AMBULATORY_CARE_PROVIDER_SITE_OTHER): Payer: Medicare Other | Admitting: Pharmacotherapy

## 2017-03-13 ENCOUNTER — Encounter: Payer: Self-pay | Admitting: Pharmacotherapy

## 2017-03-13 VITALS — BP 108/62 | HR 54 | Ht 63.0 in | Wt 140.2 lb

## 2017-03-13 DIAGNOSIS — E1165 Type 2 diabetes mellitus with hyperglycemia: Secondary | ICD-10-CM

## 2017-03-13 DIAGNOSIS — Z794 Long term (current) use of insulin: Secondary | ICD-10-CM | POA: Diagnosis not present

## 2017-03-13 DIAGNOSIS — IMO0001 Reserved for inherently not codable concepts without codable children: Secondary | ICD-10-CM

## 2017-03-13 DIAGNOSIS — I10 Essential (primary) hypertension: Secondary | ICD-10-CM

## 2017-03-13 DIAGNOSIS — I2510 Atherosclerotic heart disease of native coronary artery without angina pectoris: Secondary | ICD-10-CM

## 2017-03-13 MED ORDER — "INSULIN SYRINGE 30G X 5/16"" 1 ML MISC": 3 refills | Status: DC

## 2017-03-13 MED ORDER — INSULIN ASPART 100 UNIT/ML FLEXPEN: 8.0000 [IU] | PEN_INJECTOR | Freq: Three times a day (TID) | SUBCUTANEOUS | 11 refills | Status: DC

## 2017-03-13 MED ORDER — INSULIN DETEMIR 100 UNIT/ML ~~LOC~~ SOLN: 55.0000 [IU] | Freq: Every day | SUBCUTANEOUS | 5 refills | Status: DC

## 2017-03-13 NOTE — Progress Notes (Signed)
  Subjective:    [name] is a 71 y.o.white female who presents for follow-up of Type 2 diabetes mellitus.   Last month Trulicity and Metformin stopped. Levemir decreased to 40 units daily and Novolog 5 units with each meal added.  She reports her fasting BG 157-243 with an average of 152m/dl She is retaining more fluid. She is taking her Novolog.  Has not checked any mealtime BG values.  Has polyphagia. She has polydipsia. Has polyuria Nocturia 2-3 times per night. Still having some dysuria Diarrhea has resolved. Some blurry vision.  Eye exam is due. Some tingling in feet. Peripheral edema in right ankle. Trying to make healthy food choices. Walking for exercise. She is drinking water.  Levemir 45 units daily Novolog 5 units daily.   Review of Systems A comprehensive review of systems was negative except for: Eyes: positive for blurry vision Cardiovascular: positive for lower extremity edema Genitourinary: positive for dysuria and nocturia Endocrine: positive for diabetic symptoms including blurry vision, polydipsia, polyphagia, polyuria and skin dryness    Objective:    BP 108/62   Pulse (!) 54   Ht 5' 3"  (1.6 m)   Wt 140 lb 3.2 oz (63.6 kg)   SpO2 95%   BMI 24.84 kg/m   General:  alert, cooperative and no distress  Oropharynx: normal findings: lips normal without lesions and gums healthy   Eyes:  negative findings: lids and lashes normal and conjunctivae and sclerae normal   Ears:  external ears normal        Lung: clear to auscultation bilaterally  Heart:  regular rate and rhythm     Extremities: edema in right ankle  Skin: dry     Neuro: mental status, speech normal, alert and oriented x3 and gait and station normal   Lab Review Glucose, Bld (mg/dL)  Date Value  01/23/2017 118 (H)  09/02/2016 185 (H)  08/19/2016 88   CO2  Date Value  01/23/2017 31 mmol/L  09/02/2016 27 mEq/L  08/19/2016 29 mmol/L   BUN (mg/dL)  Date Value   01/23/2017 17  09/02/2016 16  08/19/2016 16  07/24/2015 11  03/27/2015 15  11/07/2014 15   Creat (mg/dL)  Date Value  01/23/2017 0.82  08/19/2016 0.88  06/16/2016 1.02 (H)   Creatinine, Ser (mg/dL)  Date Value  09/02/2016 0.85  07/15/2016 0.93  06/24/2016 1.03       Assessment:    Diabetes Mellitus type II, under poor control. Needs more insulin. BP at goal <130/80   Plan:    1.  Rx changes: 1.  Increase Levermir 55 units.  2.  Increase Novolog to 8 units with each meal  2.  Counseled on nutrition goals. 3.  Praised exercise efforts. 4.  BP at goal <130/80. 5.  RTC in 1 month

## 2017-03-13 NOTE — Patient Instructions (Signed)
Increase Levemir 55 units daily  Increase Novolog 8 units with each meal.

## 2017-03-13 NOTE — Addendum Note (Signed)
Addended by: Logan Bores on: 03/13/2017 12:23 PM   Modules accepted: Orders

## 2017-03-16 ENCOUNTER — Telehealth: Payer: Self-pay

## 2017-03-16 DIAGNOSIS — K7460 Unspecified cirrhosis of liver: Secondary | ICD-10-CM

## 2017-03-16 NOTE — Telephone Encounter (Signed)
ROV scheduled labs and [place] in [place] have been scheduled for an abdominal ultrasound at [facility] (1st floor of hospital) on 03/22/17 at 930 am. Please arrive 15 minutes prior to your appointment for registration. Make certain not to have anything to eat or drink 6 hours prior to your appointment. Should you need to reschedule your appointment, please contact radiology at [phone]. This test typically takes about 30 minutes to perform.  The patient has been notified of this information and all questions answered.

## 2017-03-16 NOTE — Telephone Encounter (Signed)
-----   Message from Jeoffrey Massed, RN sent at 09/13/2016  2:34 PM EDT ----- Needs rov, [place] and AFP in 6 months

## 2017-03-22 ENCOUNTER — Other Ambulatory Visit: Payer: Medicare Other

## 2017-03-22 ENCOUNTER — Ambulatory Visit (HOSPITAL_COMMUNITY): Payer: Medicare Other

## 2017-03-22 DIAGNOSIS — K7460 Unspecified cirrhosis of liver: Secondary | ICD-10-CM | POA: Diagnosis not present

## 2017-03-23 ENCOUNTER — Ambulatory Visit (HOSPITAL_COMMUNITY)
Admission: RE | Admit: 2017-03-23 | Discharge: 2017-03-23 | Disposition: A | Discharge status: Home / Self Care | Payer: Medicare Other | Source: Ambulatory Visit | Attending: Gastroenterology | Admitting: Gastroenterology

## 2017-03-23 DIAGNOSIS — N289 Disorder of kidney and ureter, unspecified: Secondary | ICD-10-CM | POA: Insufficient documentation

## 2017-03-23 DIAGNOSIS — K7460 Unspecified cirrhosis of liver: Secondary | ICD-10-CM | POA: Diagnosis not present

## 2017-03-23 DIAGNOSIS — K8020 Calculus of gallbladder without cholecystitis without obstruction: Secondary | ICD-10-CM | POA: Diagnosis not present

## 2017-03-23 DIAGNOSIS — I81 Portal vein thrombosis: Secondary | ICD-10-CM | POA: Insufficient documentation

## 2017-03-23 DIAGNOSIS — R161 Splenomegaly, not elsewhere classified: Secondary | ICD-10-CM | POA: Insufficient documentation

## 2017-03-23 LAB — AFP TUMOR MARKER: AFP-Tumor Marker: 2.3 ng/mL

## 2017-04-03 ENCOUNTER — Other Ambulatory Visit (INDEPENDENT_AMBULATORY_CARE_PROVIDER_SITE_OTHER): Payer: Medicare Other

## 2017-04-03 ENCOUNTER — Ambulatory Visit (INDEPENDENT_AMBULATORY_CARE_PROVIDER_SITE_OTHER): Payer: Medicare Other | Admitting: Gastroenterology

## 2017-04-03 ENCOUNTER — Encounter: Payer: Self-pay | Admitting: Gastroenterology

## 2017-04-03 VITALS — BP 116/60 | HR 56 | Ht 63.0 in | Wt 142.1 lb

## 2017-04-03 DIAGNOSIS — K7460 Unspecified cirrhosis of liver: Secondary | ICD-10-CM

## 2017-04-03 DIAGNOSIS — I81 Portal vein thrombosis: Secondary | ICD-10-CM

## 2017-04-03 DIAGNOSIS — I2510 Atherosclerotic heart disease of native coronary artery without angina pectoris: Secondary | ICD-10-CM | POA: Diagnosis not present

## 2017-04-03 DIAGNOSIS — K838 Other specified diseases of biliary tract: Secondary | ICD-10-CM

## 2017-04-03 LAB — CBC WITH DIFFERENTIAL/PLATELET
BASOS ABS: 0 10*3/uL (ref 0.0–0.1)
Basophils Relative: 0.7 % (ref 0.0–3.0)
EOS ABS: 0.1 10*3/uL (ref 0.0–0.7)
Eosinophils Relative: 2.5 % (ref 0.0–5.0)
HEMATOCRIT: 40.3 % (ref 36.0–46.0)
Hemoglobin: 13.3 g/dL (ref 12.0–15.0)
LYMPHS ABS: 1.5 10*3/uL (ref 0.7–4.0)
LYMPHS PCT: 26.4 % (ref 12.0–46.0)
MCHC: 32.9 g/dL (ref 30.0–36.0)
MCV: 92.1 fl (ref 78.0–100.0)
MONOS PCT: 13.6 % — AB (ref 3.0–12.0)
Monocytes Absolute: 0.8 10*3/uL (ref 0.1–1.0)
NEUTROS ABS: 3.2 10*3/uL (ref 1.4–7.7)
NEUTROS PCT: 56.8 % (ref 43.0–77.0)
PLATELETS: 129 10*3/uL — AB (ref 150.0–400.0)
RBC: 4.38 Mil/uL (ref 3.87–5.11)
RDW: 13.4 % (ref 11.5–15.5)
WBC: 5.6 10*3/uL (ref 4.0–10.5)

## 2017-04-03 LAB — COMPREHENSIVE METABOLIC PANEL
ALK PHOS: 106 U/L (ref 39–117)
ALT: 40 U/L — AB (ref 0–35)
AST: 43 U/L — AB (ref 0–37)
Albumin: 3.5 g/dL (ref 3.5–5.2)
BILIRUBIN TOTAL: 0.8 mg/dL (ref 0.2–1.2)
BUN: 15 mg/dL (ref 6–23)
CALCIUM: 10 mg/dL (ref 8.4–10.5)
CO2: 28 mEq/L (ref 19–32)
CREATININE: 0.94 mg/dL (ref 0.40–1.20)
Chloride: 99 mEq/L (ref 96–112)
GFR: 62.51 mL/min (ref >60.00)
Glucose, Bld: 292 mg/dL — ABNORMAL HIGH (ref 70–99)
Potassium: 4.1 mEq/L (ref 3.5–5.1)
Sodium: 136 mEq/L (ref 135–145)
TOTAL PROTEIN: 7.7 g/dL (ref 6.0–8.3)

## 2017-04-03 LAB — PROTIME-INR
INR: 1.1 ratio — ABNORMAL HIGH (ref 0.8–1.0)
Prothrombin Time: 11.8 s (ref 9.6–13.1)

## 2017-04-03 NOTE — Progress Notes (Signed)
Review of pertinent gastrointestinal problems: 1. Personal history of adenomatous colon polyps. Colonoscopy Dr. [name] 2012 August was normal except for 2 subcentimeter polyps, one was a tubular adenoma. She was recommended to have recall colonoscopy at five-year interval. She had also had previous colonoscopies with Dr. Lajoyce Corners and found to have adenomas as well. Colonoscopy Dr. [name] 07/2016 one subCM TA, recall at 5 years 2. Cirrhosis: likely from Fatty liver  Paracentesis 03/2016 4 liters, elevated SAAG, no SBP, cytology neg for malignancy  Most recent imaging: 03/2017 [place]: cirrhosis, splenomegaly, NEW partial (non-occlusive) PV thrombus, Gallstones and new "mild dilation of intra and extrahepatic bile ducts"  Most recent AFP: 03/2017 normal  Most recent EGD: 07/2016 EGD: large varices, + portal gastropathy; started nadolol after discussion with cardiology (nadolol 36m once dialy).  Immunization for Hep A/B:  Started 06/2016   HPI: This is a very pleasant 71year old woman whom I last saw 6 or 7 months ago.  Chief complaint is cirrhosis, likely from fatty liver disease  She has had no overt GI bleeding.  No signs of encephalopathy.  Her ascites and lower extremity edema are under good control on her current diuretic therapy  She had an abdominal ultrasound and alpha-fetoprotein earlier this month for hepatoma screening.  See those results above.  She avoids extra salt, fatty foods.  She never takes NSAIDs.   ROS: complete GI ROS as described in HPI, all other review negative.  Constitutional:  No unintentional weight loss (up about 4 pounds in 7 months).   Past Medical History:  Diagnosis Date  . Allergy   . Arthritis    neck  . Cataract    bilateral - MD monitoring cataracts  . CHF (congestive heart failure) ([facility]   . Chronic kidney disease, stage I    DR OTTELIN  HX UTIS  . Cir[place]osis ([place]   . Cramp of limb   . Diabetes mellitus   . Dysphagia, unspecified(787.20)   .  Dysuria   . Epistaxis   . GERD (gastroesophageal reflux disease)   . Heart murmur    NO CARDIOLOGIST  DX FOR YEARS ASYMPTOMATIC  . Lumbago   . Neoplasm of uncertain behavior of skin   . Nonspecific elevation of levels of transaminase or lactic acid dehydrogenase (LDH)   . Osteoarthrosis, unspecified whether generalized or localized, unspecified site   . Other and unspecified hyperlipidemia    diet controlled  . Pain in joint, shoulder region   . Paresthesias 04/01/2015  . Postablative ovarian failure   . Trochanteric bursitis of left hip 12/15/2015  . Type 2 diabetes mellitus without complication ([facility]   . Unspecified essential hypertension    no meds    Past Surgical History:  Procedure Laterality Date  . CARDIAC CATHETERIZATION N/A 01/27/2016   Procedure: Left Heart Cath and Coronary Angiography;  Surgeon: HBelva Crome MD;  [facility];  Service: Cardiovascular;  Laterality: N/A;  . COLONOSCOPY  2012   [employer] - polyp  . COLONOSCOPY WITH PROPOFOL N/A 07/07/2016   Procedure: COLONOSCOPY WITH PROPOFOL;  Surgeon: DMilus Banister MD;  Location: [facility];  Service: Endoscopy;  Laterality: N/A;  . CORONARY ARTERY BYPASS GRAFT N/A 01/28/2016   Procedure: CORONARY ARTERY BYPASS GRAFTING (CABG) x 3 USING RIGHT LEG GREATER SAPHENOUS VEIN GRAFT;  Surgeon: SMelrose Nakayama MD;  [place] [facility]  Service: Open Heart Surgery;  Laterality: N/A;  . ENDOVEIN HARVEST OF GREATER SAPHENOUS VEIN Right 01/28/2016   Procedure: ENDOVEIN HARVEST  OF GREATER SAPHENOUS VEIN;  Surgeon: Melrose Nakayama, MD;  Location: [place];  Service: Open Heart Surgery;  Laterality: Right;  . ESOPHAGOGASTRODUODENOSCOPY (EGD) WITH PROPOFOL N/A 07/07/2016   Procedure: ESOPHAGOGASTRODUODENOSCOPY (EGD) WITH PROPOFOL;  Surgeon: [name], MD;  Location: [facility];  Service: Endoscopy;  Laterality: N/A;  . MAXIMUM ACCESS (MAS)POSTERIOR LUMBAR INTERBODY FUSION (PLIF) 1 LEVEL Left 06/10/2015    Procedure: FOR MAXIMUM ACCESS (MAS) POSTERIOR LUMBAR INTERBODY FUSION (PLIF) LUMBAR THREE-FOUR EXTRAFORAMINAL MICRODISCECTOMY LUMBAR FIVE-SACRAL ONE LEFT;  Surgeon: Eustace Moore, MD;  Location: [facility];  Service: Neurosurgery;  Laterality: Left;  . TEE WITHOUT CARDIOVERSION N/A 01/28/2016   Procedure: TRANSESOPHAGEAL ECHOCARDIOGRAM (TEE);  Surgeon: Melrose Nakayama, MD;  Location: [place];  Service: Open Heart Surgery;  Laterality: N/A;  . TUBAL LIGATION  1982   Dr Connye Burkitt  . UPPER GASTROINTESTINAL ENDOSCOPY    . VAGINAL HYSTERECTOMY  1997   Dr Rande Lawman    Current Outpatient Medications  Medication Sig Dispense Refill  . acetaminophen (TYLENOL) 325 MG tablet Take 2 tablets (650 mg total) by mouth every 6 (six) hours as needed for mild pain.    . Aromatic Inhalants (VICKS VAPOR IN) Vicks Vapor Rub apply small amount to outside of nose to help breathing    . aspirin EC 81 MG tablet Take 1 tablet (81 mg total) by mouth daily. 90 tablet 3  . Biotin 10000 MCG TABS Take 10,000 mcg by mouth every morning.    . calcium carbonate (OS-CAL) 600 MG TABS Take 600 mg by mouth 2 (two) times daily with a meal.      . cholecalciferol (VITAMIN D) 1000 UNITS tablet Take 2,000 Units by mouth daily.     . Cyanocobalamin (VITAMIN B 12 PO) Take 1,000 mcg by mouth daily.      . fish oil-omega-3 fatty acids 1000 MG capsule Take 1 g by mouth daily.      . furosemide (LASIX) 40 MG tablet Take 40 mg by mouth daily.    . insulin aspart (NOVOLOG FLEXPEN) 100 UNIT/ML FlexPen Inject 8 Units 3 (three) times daily with meals into the skin. 15 mL 11  . insulin detemir (LEVEMIR) 100 UNIT/ML injection Inject 0.55 mLs (55 Units total) daily into the skin. 10 mL 5  . Insulin Pen Needle 32G X 4 MM MISC 1 each by Does not apply route 3 (three) times daily. 100 each 12  . Insulin Syringe-Needle U-100 (INSULIN SYRINGE .5CC/31GX5/16") 31G X 5/16" 0.5 ML MISC USE DAILY WITH INSULIN 100 each 6  . Insulin Syringe-Needle U-100 (INSULIN  SYRINGE 1CC/30GX5/16") 30G X 5/16" 1 ML MISC Use daily with levemir injection DX E11.65 100 each 3  . MAGNESIUM PO Take 500 mg by mouth 2 (two) times daily in the am and at bedtime..    . Multiple Vitamins-Minerals (MULTIVITAMIN WITH MINERALS) tablet Take 1 tablet by mouth daily.      . nadolol (CORGARD) 80 MG tablet TAKE 1 TABLET(80 MG) BY MOUTH DAILY 30 tablet 0  . omeprazole (PRILOSEC) 20 MG capsule TAKE ONE CAPSULE BY MOUTH DAILY 90 capsule 2  . Polyethyl Glycol-Propyl Glycol (SYSTANE OP) Place 1 drop into both eyes 2 (two) times daily.    . potassium chloride SA (K-DUR,KLOR-CON) 20 MEQ tablet TAKE 1 TABLET BY MOUTH DAILY FOR POTASSIUM SUPPLEMENT 90 tablet 1  . Probiotic Product (PROBIOTIC DAILY PO) Take 1 capsule by mouth daily. Digestive Advantage Probiotic    . spironolactone (ALDACTONE) 100 MG tablet Take  1 tablet (100 mg total) by mouth daily. 60 tablet 6   No current facility-administered medications for this visit.     Allergies as of 04/03/2017 - Review Complete 04/03/2017  Allergen Reaction Noted  . Kiwi extract Anaphylaxis 05/28/2015  . Tdap [diphth-acell pertussis-tetanus] Swelling and Other (See Comments) 03/27/2013  . Statins  05/08/2016  . Latex Itching, Dermatitis, and Rash 12/22/2010  . Tramadol Nausea And Vomiting 05/28/2015    Family History  Problem Relation Age of Onset  . Lung cancer Father   . Arthritis Sister   . Arthritis Brother   . Heart disease Maternal Grandmother   . Heart disease Maternal Grandfather   . Heart disease Paternal Grandmother   . Heart disease Paternal Grandfather   . Breast cancer Mother   . Liver cancer Brother   . Breast cancer Maternal Aunt   . Colon cancer Neg Hx   . Esophageal cancer Neg Hx   . Rectal cancer Neg Hx   . Stomach cancer Neg Hx     Social History   Socioeconomic History  . Marital status: Married    Spouse name: Not on file  . Number of children: 6  . Years of education: Not on file  . Highest education  level: Not on file  Social Needs  . Financial resource strain: Not on file  . Food insecurity - worry: Not on file  . Food insecurity - inability: Not on file  . Transportation needs - medical: Not on file  . Transportation needs - non-medical: Not on file  Occupational History  . Occupation: retired  Tobacco Use  . Smoking status: Never Smoker  . Smokeless tobacco: Never Used  Substance and Sexual Activity  . Alcohol use: No  . Drug use: No  . Sexual activity: Yes    Partners: Male    Birth control/protection: Post-menopausal, Surgical    Comment: Hysterectomy  Other Topics Concern  . Not on file  Social History Narrative  . Not on file     Physical Exam: BP 116/60 (BP Location: Left Arm, Patient Position: Sitting, Cuff Size: Normal)   Pulse (!) 56   Ht 5' 3"  (1.6 m)   Wt 142 lb 2 oz (64.5 kg)   BMI 25.18 kg/m  Constitutional: generally well-appearing Psychiatric: alert and oriented x3 Abdomen: soft, nontender, nondistended, no obvious ascites, no peritoneal signs, normal bowel sounds No peripheral edema noted in lower extremities  Assessment and plan: 70 y.o. female with child B cirrhosis, from long-standing fatty liver disease likely  She has a new portal vein thrombus by ultrasound.  It is partially occlusive.  I think her portal vein thrombus, partially occlusive is probably from her underlying cirrhosis.  That being said when I reviewed the literature it is still suggested that she have hematology workup for prothrombotic state given that she has child A/B cirrhosis.  We will arrange for that evaluation.  I do not think she is a good candidate for anticoagulation since she has known large esophageal varices.  She is on nadolol 80 mg a day and her heart rate is in the 50s.  There is no reason for surveillance of the varices given nonselective beta-blocker treatment.  Her recent ultrasound also raised the question of potential intra-and extrahepatic biliary dilation.  I  would like more formal evaluation of this with MRI with MRCP.  She will also get CBC, complete metabolic profile and coags to restage her synthetic function.  Overall she looks very good clinically.  She avoids NSAIDs, extra salt in her diet.  She will at least come back to see me in 6 months and pending her hematology workup and MRI results possibly sooner.  Please see the "Patient Instructions" section for addition details about the plan.  Owens Loffler, MD [facility] 04/03/2017, 3:37 PM

## 2017-04-03 NOTE — Patient Instructions (Addendum)
Referral to hematology for thrombotic workup given new portal vein thrombus.  Call in 1 week if you have not heard from that office.   MRI with MRCP for dilated bile ducts on recent US.  You have been scheduled for an MRI at [facility] radiology on 04/07/17. Your appointment time is 7 am. Please arrive 15 minutes prior to your appointment time for registration purposes. Please make certain not to have anything to eat or drink 6 hours prior to your test. In addition, if you have any metal in your body, have a pacemaker or defibrillator, please be sure to let your ordering physician know. This test typically takes 45 minutes to 1 hour to complete. Should you need to reschedule, please call [phone] to do so.  You will have labs checked today in the basement lab.  Please head down after you check out with the front desk  (cbc, cmet, inr).  Please return to see Dr. [name] in 6 months.  Normal BMI (Body Mass Index- based on height and weight) is between 23 and 30. Your BMI today is Body mass index is 25.18 kg/m. [name] Kitchen Please consider follow up  regarding your BMI with your Primary Care Provider.

## 2017-04-05 ENCOUNTER — Telehealth: Payer: Self-pay | Admitting: Hematology

## 2017-04-05 ENCOUNTER — Encounter: Payer: Self-pay | Admitting: Hematology

## 2017-04-05 NOTE — Telephone Encounter (Signed)
Appt has been scheduled for the pt to see Dr. Burr Medico on 12/10 at 11am. Pt has agreed to the appt date and time. Address verified. Letter mailed.

## 2017-04-07 ENCOUNTER — Ambulatory Visit (HOSPITAL_COMMUNITY)
Admission: RE | Admit: 2017-04-07 | Discharge: 2017-04-07 | Disposition: A | Discharge status: Home / Self Care | Payer: Medicare Other | Source: Ambulatory Visit | Attending: Gastroenterology | Admitting: Gastroenterology

## 2017-04-07 ENCOUNTER — Other Ambulatory Visit: Payer: Self-pay | Admitting: Gastroenterology

## 2017-04-07 DIAGNOSIS — K7460 Unspecified cirrhosis of liver: Secondary | ICD-10-CM | POA: Diagnosis not present

## 2017-04-07 DIAGNOSIS — I864 Gastric varices: Secondary | ICD-10-CM | POA: Diagnosis not present

## 2017-04-07 DIAGNOSIS — K766 Portal hypertension: Secondary | ICD-10-CM | POA: Insufficient documentation

## 2017-04-07 DIAGNOSIS — I81 Portal vein thrombosis: Secondary | ICD-10-CM

## 2017-04-07 DIAGNOSIS — R932 Abnormal findings on diagnostic imaging of liver and biliary tract: Secondary | ICD-10-CM | POA: Diagnosis not present

## 2017-04-07 DIAGNOSIS — K838 Other specified diseases of biliary tract: Secondary | ICD-10-CM | POA: Diagnosis not present

## 2017-04-07 DIAGNOSIS — K8020 Calculus of gallbladder without cholecystitis without obstruction: Secondary | ICD-10-CM | POA: Diagnosis not present

## 2017-04-07 MED ORDER — GADOBENATE DIMEGLUMINE 529 MG/ML IV SOLN
15.0000 mL | Freq: Once | INTRAVENOUS | Status: AC | PRN
  Administered 2017-04-07: 13 mL via INTRAVENOUS

## 2017-04-10 ENCOUNTER — Encounter: Payer: Self-pay | Admitting: Pharmacotherapy

## 2017-04-10 ENCOUNTER — Ambulatory Visit (INDEPENDENT_AMBULATORY_CARE_PROVIDER_SITE_OTHER): Payer: Medicare Other | Admitting: Pharmacotherapy

## 2017-04-10 ENCOUNTER — Other Ambulatory Visit: Payer: Self-pay | Admitting: Gastroenterology

## 2017-04-10 VITALS — BP 112/70 | HR 55 | Ht 63.0 in | Wt 141.0 lb

## 2017-04-10 DIAGNOSIS — I10 Essential (primary) hypertension: Secondary | ICD-10-CM

## 2017-04-10 DIAGNOSIS — Z794 Long term (current) use of insulin: Secondary | ICD-10-CM

## 2017-04-10 DIAGNOSIS — I2510 Atherosclerotic heart disease of native coronary artery without angina pectoris: Secondary | ICD-10-CM | POA: Diagnosis not present

## 2017-04-10 DIAGNOSIS — E1165 Type 2 diabetes mellitus with hyperglycemia: Secondary | ICD-10-CM | POA: Diagnosis not present

## 2017-04-10 DIAGNOSIS — IMO0001 Reserved for inherently not codable concepts without codable children: Secondary | ICD-10-CM

## 2017-04-10 IMAGING — CR DG CHEST 2V
2 series · 2 of 2 positions shown · non-contrast
Comparison: PA and lateral chest x-ray dated November 06, 2005

CLINICAL DATA: Preoperative examination prior to lumbar fusion

EXAM:
CHEST  2 VIEW

[w chest pa]
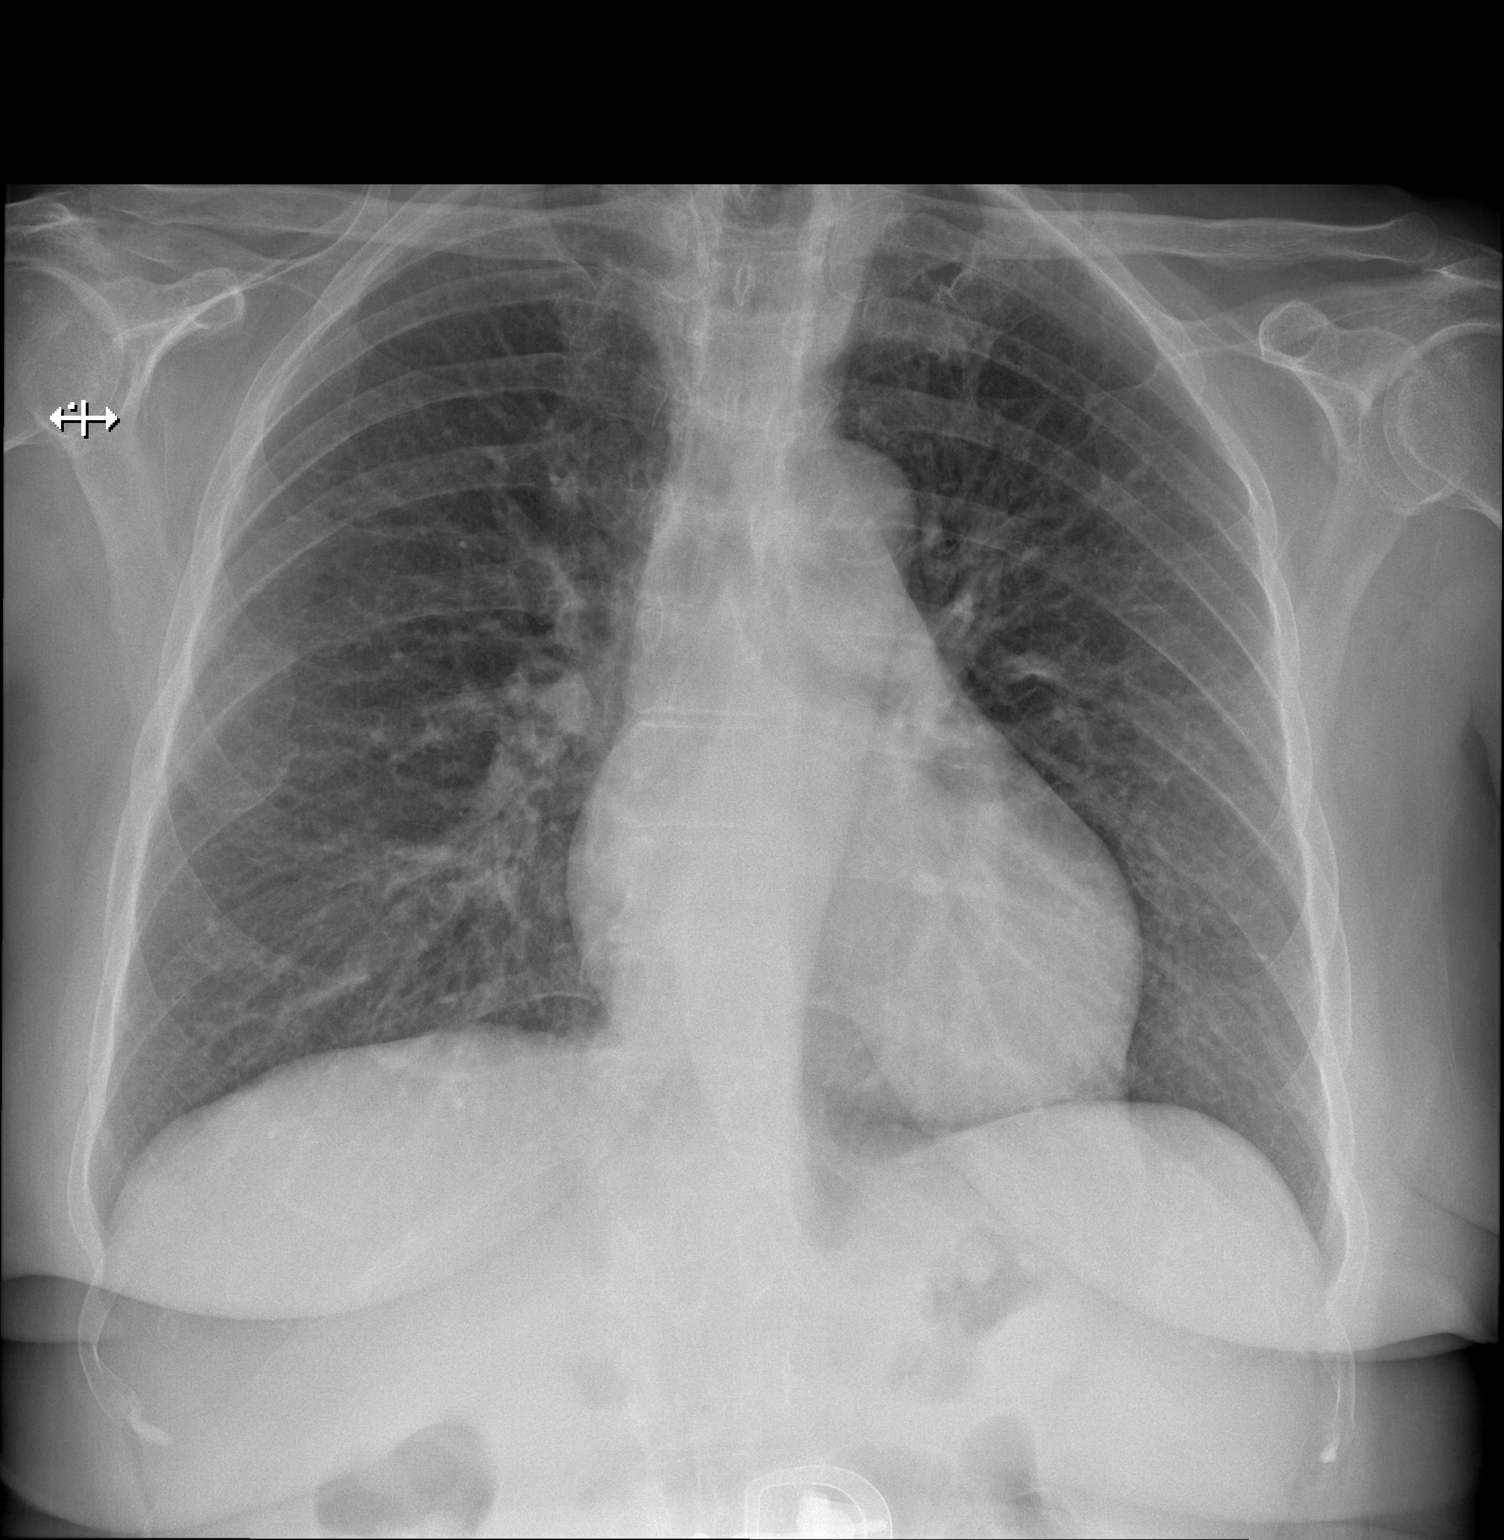

[w chest lat]
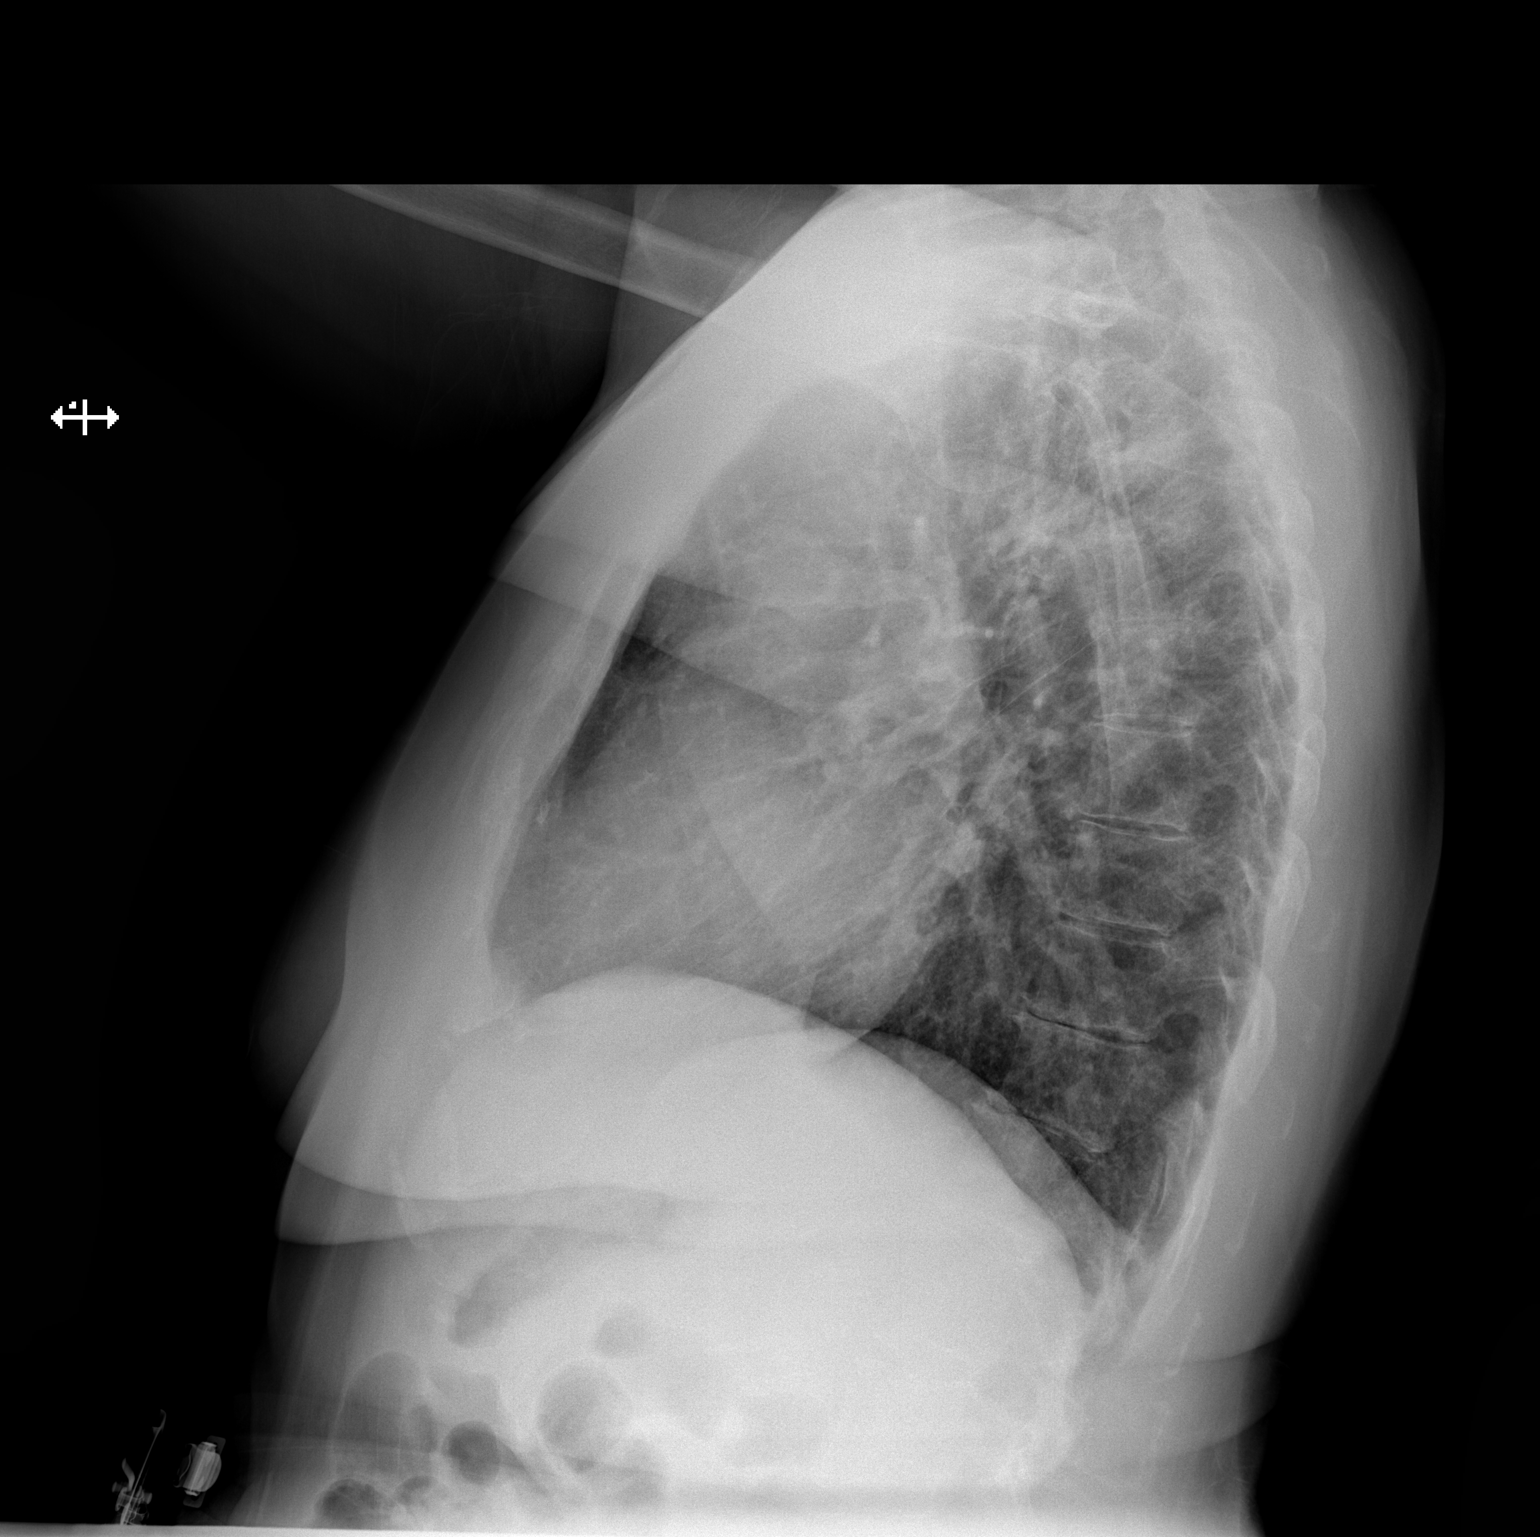

[2 of 2 positions shown; findings below may reference images not displayed]

FINDINGS: The lungs are well-expanded. The interstitial markings are coarse
bilaterally and are more conspicuous than in the past. There is no
alveolar infiltrate. There is no pleural effusion. The heart and
pulmonary vascularity are normal. The trachea is midline. The bony
thorax exhibits gentle dextro curvature centered at the
thoracolumbar junction. There is mild multilevel degenerative disc
space narrowing of the thoracic spine.
IMPRESSION: Mildly increased pulmonary interstitial markings more conspicuous
than on the study of approximately 9-1/2 years ago. This may reflect
interval development of chronic bronchitis. There is no alveolar
pneumonia nor CHF.

## 2017-04-10 NOTE — Progress Notes (Signed)
  Subjective:    [name] is a 71 y.o.white female who presents for follow-up of Type 2 diabetes mellitus.   Currently on Levemir and Novolog. Levemir 55 units daily Novolog 8 units with each meal.  Forgot to bring blood glucose meter. Fasting BG: 126-125m/dl Has not checked any mealtime blood glucose.  Trying to make healthy food choices. Walking for exercise. Continues to have blurry vision. Denies problems with feet Peripheral edema has improved. Polyuria present Nocturia present. Having some pain with urination. Staying well hydrated.   Review of Systems A comprehensive review of systems was negative except for: Eyes: positive for cataracts Cardiovascular: positive for lower extremity edema Genitourinary: positive for dysuria and nocturia Endocrine: positive for diabetic symptoms including blurry vision and polyuria    Objective:    BP 112/70   Pulse (!) 55   Ht 5' 3"  (1.6 m)   Wt 141 lb (64 kg)   SpO2 98%   BMI 24.98 kg/m   General:  alert, cooperative and no distress  Oropharynx: normal findings: lips normal without lesions and gums healthy   Eyes:  negative findings: lids and lashes normal and conjunctivae and sclerae normal   Ears:  external ears normal        Lung: clear to auscultation bilaterally  Heart:  regular rate and rhythm     Extremities: edema trace edema in lower extremities  Skin: warm and dry, no hyperpigmentation, vitiligo, or suspicious lesions     Neuro: mental status, speech normal, alert and oriented x3 and gait and station normal   Lab Review Glucose, Bld (mg/dL)  Date Value  04/03/2017 292 (H)  01/23/2017 118 (H)  09/02/2016 185 (H)   CO2  Date Value  04/03/2017 28 mEq/L  01/23/2017 31 mmol/L  09/02/2016 27 mEq/L   BUN (mg/dL)  Date Value  04/03/2017 15  01/23/2017 17  09/02/2016 16  07/24/2015 11  03/27/2015 15  11/07/2014 15   Creat (mg/dL)  Date Value  01/23/2017 0.82  08/19/2016 0.88  06/16/2016  1.02 (H)   Creatinine, Ser (mg/dL)  Date Value  04/03/2017 0.94  09/02/2016 0.85  07/15/2016 0.93       Assessment:    Diabetes Mellitus type II, under fair control.  SMBG indicates improved control. BP at goal <130/80   Plan:    1.  Rx changes: none  2.  Continue Levemir 55 units daily 3.  Continue Novolog 8 units with each meal. 4.  Needs to SMBG fasting and with meals and/or bedtime. 5.  Counseled on nutrition goals. 6.  Exercise goals 30-45 minutes 5 x week. 7.  BP at goal <130/80

## 2017-04-10 NOTE — Patient Instructions (Signed)
Continue same doses of insulin.  Need to monitor blood glucose with meals and at bedtime.  Merry Christmas!

## 2017-04-17 ENCOUNTER — Encounter: Payer: Medicare Other | Admitting: Hematology

## 2017-04-19 ENCOUNTER — Telehealth: Payer: Self-pay | Admitting: Hematology

## 2017-04-19 NOTE — Telephone Encounter (Signed)
Spoke to patient regarding upcoming December appointments. Rescheduled from 12/10 to 12/14

## 2017-04-21 ENCOUNTER — Ambulatory Visit (HOSPITAL_BASED_OUTPATIENT_CLINIC_OR_DEPARTMENT_OTHER): Payer: Medicare Other | Admitting: Hematology

## 2017-04-21 ENCOUNTER — Encounter: Payer: Self-pay | Admitting: Hematology

## 2017-04-21 DIAGNOSIS — Z7982 Long term (current) use of aspirin: Secondary | ICD-10-CM | POA: Diagnosis not present

## 2017-04-21 DIAGNOSIS — K7581 Nonalcoholic steatohepatitis (NASH): Secondary | ICD-10-CM

## 2017-04-21 DIAGNOSIS — D696 Thrombocytopenia, unspecified: Secondary | ICD-10-CM

## 2017-04-21 DIAGNOSIS — I81 Portal vein thrombosis: Secondary | ICD-10-CM | POA: Diagnosis not present

## 2017-04-21 NOTE — Progress Notes (Signed)
[place]  [phone] Fax:([phone]) [place] consult Note   Patient Care Team: Gayland Curry, DO as PCP - General (Geriatric Medicine) Berle Mull, MD as Consulting Physician (Family Medicine) Sharmon Revere as Physician Assistant (Cardiology) Calvert Cantor, MD as Consulting Physician (Ophthalmology)   Date of Service:  04/21/2017    Referring Physician: Dr. [name]  CHIEF COMPLAINTS/PURPOSE OF CONSULTATION:  Nonocclusive portal vein thrombosis  HISTORY OF PRESENTING ILLNESS: 04/21/17 [name] 71 y.o. female is a here because of Thrombocytopenia and nonocclusive portal vein thrombosis. The patient was referred by GI, Dr. [name]. The patient presents to the clinic today accompanied by her husband.  She has regular [place] for her liver cancer screening. The [place] of abdomen from 03/23/17 showed nonocclusive portal vein thrombosis along with her known liver cirrhosis. This was not seen in her previous US in May 2018. Dr. [name] referred her for a hematology workup done. She last saw him 2 weeks ago. She has large varices so Dr. [name] did not want to put on long acting blood thinner yet.  Today the patient denies arm pain or chest pain since her heart surgery last year. She is currently on baby aspirin. She takes a lasix daily as she retains fluid. This week she noticed pain in her right abdomen, likely related to her liver. She has regular BM mostly. She denies syncope from liver disease. She has considered liver transplant if needed.  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   Her medical history nor her family history presents for blood clots or bleeding disorder. Her mother and 3 aunt had breast cancer. Her father had lung cancer. She has significant comorbidities of liver cirrhosis from fatty liver. This was discovered after she had open heart surgery for CABG in 01/2016. She has CAD with a history of CHF. She  has hypercholesterolemia and uncontrolled type 2 DM.    She denies recent chest pain on exertion, shortness of breath on minimal exertion, pre-syncopal episodes, or palpitations. She had not noticed any recent bleeding such as epistaxis, hematuria or hematochezia The patient denies is on baby aspirin. Her last colonoscopy was 07/2016 She had no prior history or diagnosis of cancer. Her age appropriate screening programs are up-to-date. She denies any pica and eats a variety of diet. She never donated blood or received blood transfusion   MEDICAL HISTORY:  Past Medical History:  Diagnosis Date  . Allergy   . Arthritis    neck  . Cataract    bilateral - MD monitoring cataracts  . CHF (congestive heart failure) ([place])   . Chronic kidney disease, stage I    DR OTTELIN  HX UTIS  . Cirrhosis ([place])   . Cramp of limb   . Diabetes mellitus   . Dysphagia, unspecified(787.20)   . Dysuria   . Epistaxis   . GERD (gastroesophageal reflux disease)   . Heart murmur    NO CARDIOLOGIST  DX FOR YEARS ASYMPTOMATIC  . Lumbago   . Neoplasm of uncertain behavior of skin   . Nonspecific elevation of levels of transaminase or lactic acid dehydrogenase (LDH)   . Osteoarthrosis, unspecified whether generalized or localized, unspecified site   . Other and unspecified hyperlipidemia    diet controlled  . Pain in joint, shoulder region   . Paresthesias 04/01/2015  . Postablative ovarian failure   . Trochanteric bursitis of left hip 12/15/2015  . Type 2 diabetes  mellitus without complication ([place])   . Unspecified essential hypertension    no meds    SURGICAL HISTORY: Past Surgical History:  Procedure Laterality Date  . CARDIAC CATHETERIZATION N/A 01/27/2016   Procedure: Left Heart Cath and Coronary Angiography;  Surgeon: Belva Crome, MD;  Location: [facility];  Service: Cardiovascular;  Laterality: N/A;  . COLONOSCOPY  2012   [facility] - polyp  . COLONOSCOPY WITH PROPOFOL N/A 07/07/2016    Procedure: COLONOSCOPY WITH PROPOFOL;  Surgeon: [name], MD;  Location: [facility];  Service: Endoscopy;  Laterality: N/A;  . CORONARY ARTERY BYPASS GRAFT N/A 01/28/2016   Procedure: CORONARY ARTERY BYPASS GRAFTING (CABG) x 3 USING RIGHT LEG GREATER SAPHENOUS VEIN GRAFT;  Surgeon: Melrose Nakayama, MD;  Location: [facility];  Service: Open Heart Surgery;  Laterality: N/A;  . ENDOVEIN HARVEST OF GREATER SAPHENOUS VEIN Right 01/28/2016   Procedure: ENDOVEIN HARVEST OF GREATER SAPHENOUS VEIN;  Surgeon: Melrose Nakayama, MD;  Location: [facility];  Service: Open Heart Surgery;  Laterality: Right;  . ESOPHAGOGASTRODUODENOSCOPY (EGD) WITH PROPOFOL N/A 07/07/2016   Procedure: ESOPHAGOGASTRODUODENOSCOPY (EGD) WITH PROPOFOL;  Surgeon: [name], MD;  Location: [facility];  Service: Endoscopy;  Laterality: N/A;  . MAXIMUM ACCESS (MAS)POSTERIOR LUMBAR INTERBODY FUSION (PLIF) 1 LEVEL Left 06/10/2015   Procedure: FOR MAXIMUM ACCESS (MAS) POSTERIOR LUMBAR INTERBODY FUSION (PLIF) LUMBAR THREE-FOUR EXTRAFORAMINAL MICRODISCECTOMY LUMBAR FIVE-SACRAL ONE LEFT;  Surgeon: Eustace Moore, MD;  Location: [facility];  Service: Neurosurgery;  Laterality: Left;  . TEE WITHOUT CARDIOVERSION N/A 01/28/2016   Procedure: TRANSESOPHAGEAL ECHOCARDIOGRAM (TEE);  Surgeon: Melrose Nakayama, MD;  Location: [facility];  Service: Open Heart Surgery;  Laterality: N/A;  . TUBAL LIGATION  1982   Dr Connye Burkitt  . UPPER GASTROINTESTINAL ENDOSCOPY    . VAGINAL HYSTERECTOMY  1997   Dr Rande Lawman    SOCIAL HISTORY: Social History   Socioeconomic History  . Marital status: Married    Spouse name: Not on file  . Number of children: 6  . Years of education: Not on file  . Highest education level: Not on file  Social Needs  . Financial resource strain: Not on file  . Food insecurity - worry: Not on file  . Food insecurity - inability: Not on file  . Transportation needs - medical: Not on file  . Transportation needs - non-medical:  Not on file  Occupational History  . Occupation: retired  Tobacco Use  . Smoking status: Never Smoker  . Smokeless tobacco: Never Used  Substance and Sexual Activity  . Alcohol use: No  . Drug use: No  . Sexual activity: Yes    Partners: Male    Birth control/protection: Post-menopausal, Surgical    Comment: Hysterectomy  Other Topics Concern  . Not on file  Social History Narrative  . Not on file    FAMILY HISTORY: Family History  Problem Relation Age of Onset  . Lung cancer Father   . Arthritis Sister   . Arthritis Brother   . Heart disease Maternal Grandmother   . Heart disease Maternal Grandfather   . Heart disease Paternal Grandmother   . Heart disease Paternal Grandfather   . Breast cancer Mother   . Liver cancer Brother   . Breast cancer Maternal Aunt   . Colon cancer Neg Hx   . Esophageal cancer Neg Hx   . Rectal cancer Neg Hx   . Stomach cancer Neg Hx     ALLERGIES:  is  allergic to kiwi extract; tdap [diphth-acell pertussis-tetanus]; statins; latex; and tramadol.  MEDICATIONS:  Current Outpatient Medications  Medication Sig Dispense Refill  . Aromatic Inhalants (VICKS VAPOR IN) Vicks Vapor Rub apply small amount to outside of nose to help breathing    . aspirin EC 81 MG tablet Take 1 tablet (81 mg total) by mouth daily. 90 tablet 3  . Biotin 10000 MCG TABS Take 10,000 mcg by mouth every morning.    . calcium carbonate (OS-CAL) 600 MG TABS Take 600 mg by mouth 2 (two) times daily with a meal.      . cholecalciferol (VITAMIN D) 1000 UNITS tablet Take 2,000 Units by mouth daily.     . Cyanocobalamin (VITAMIN B 12 PO) Take 1,000 mcg by mouth daily.      . fish oil-omega-3 fatty acids 1000 MG capsule Take 1 g by mouth daily.      . furosemide (LASIX) 40 MG tablet Take 40 mg by mouth daily.    . insulin aspart (NOVOLOG FLEXPEN) 100 UNIT/ML FlexPen Inject 8 Units 3 (three) times daily with meals into the skin. 15 mL 11  . insulin detemir (LEVEMIR) 100 UNIT/ML  injection Inject 0.55 mLs (55 Units total) daily into the skin. 10 mL 5  . Insulin Pen Needle 32G X 4 MM MISC 1 each by Does not apply route 3 (three) times daily. 100 each 12  . Insulin Syringe-Needle U-100 (INSULIN SYRINGE .5CC/31GX5/16") 31G X 5/16" 0.5 ML MISC USE DAILY WITH INSULIN 100 each 6  . Insulin Syringe-Needle U-100 (INSULIN SYRINGE 1CC/30GX5/16") 30G X 5/16" 1 ML MISC Use daily with levemir injection DX E11.65 100 each 3  . MAGNESIUM PO Take 500 mg by mouth 2 (two) times daily in the am and at bedtime..    . Multiple Vitamins-Minerals (MULTIVITAMIN WITH MINERALS) tablet Take 1 tablet by mouth daily.      . nadolol (CORGARD) 80 MG tablet TAKE 1 TABLET(80 MG) BY MOUTH DAILY 30 tablet 0  . omeprazole (PRILOSEC) 20 MG capsule TAKE ONE CAPSULE BY MOUTH DAILY 90 capsule 2  . Polyethyl Glycol-Propyl Glycol (SYSTANE OP) Place 1 drop into both eyes 2 (two) times daily.    . potassium chloride SA (K-DUR,KLOR-CON) 20 MEQ tablet TAKE 1 TABLET BY MOUTH DAILY FOR POTASSIUM SUPPLEMENT 90 tablet 1  . Probiotic Product (PROBIOTIC DAILY PO) Take 1 capsule by mouth daily. Digestive Advantage Probiotic    . spironolactone (ALDACTONE) 100 MG tablet Take 1 tablet (100 mg total) by mouth daily. 60 tablet 6  . acetaminophen (TYLENOL) 325 MG tablet Take 2 tablets (650 mg total) by mouth every 6 (six) hours as needed for mild pain. (Patient not taking: Reported on 04/21/2017)     No current facility-administered medications for this visit.     REVIEW OF SYSTEMS:   Constitutional: Denies fevers, chills or abnormal night sweats Eyes: Denies blurriness of vision, double vision or watery eyes Ears, nose, mouth, throat, and face: Denies mucositis or sore throat Respiratory: Denies cough, dyspnea or wheezes Cardiovascular: Denies palpitation, chest discomfort or lower extremity swelling Gastrointestinal:  Denies nausea, heartburn or change in bowel habits (+) right abdominal pain  Skin: Denies abnormal skin  rashes Lymphatics: Denies new lymphadenopathy or easy bruising Neurological:Denies numbness, tingling or new weaknesses Behavioral/Psych: Mood is stable, no new changes  All other systems were reviewed with the patient and are negative.  PHYSICAL EXAMINATION: ECOG PERFORMANCE STATUS: 1 - Symptomatic but completely ambulatory  Vitals:   04/21/17 1516  BP: (!) 108/53  Pulse: (!) 55  Resp: 20  Temp: 98.7 F (37.1 C)  SpO2: 99%   Filed Weights   04/21/17 1516  Weight: 143 lb 1.6 oz (64.9 kg)    GENERAL:alert, no distress and comfortable SKIN: skin color, texture, turgor are normal, no rashes or significant lesions EYES: normal, conjunctiva are pink and non-injected, sclera clear OROPHARYNX:no exudate, no erythema and lips, buccal mucosa, and tongue normal  NECK: supple, thyroid normal size, non-tender, without nodularity LYMPH:  no palpable lymphadenopathy in the cervical, axillary or inguinal LUNGS: clear to auscultation and percussion with normal breathing effort HEART: regular rate & rhythm and no murmurs and no lower extremity edema ABDOMEN:abdomen soft, non-tender and normal bowel sounds Musculoskeletal:no cyanosis of digits and no clubbing  PSYCH: alert & oriented x 3 with fluent speech NEURO: no focal motor/sensory deficits  LABORATORY DATA:  I have reviewed the data as listed CBC Latest Ref Rng & Units 04/03/2017 09/02/2016 08/19/2016  WBC 4.0 - 10.5 K/uL 5.6 5.5 4.3  Hemoglobin 12.0 - 15.0 g/dL 13.3 13.6 12.6  Hematocrit 36.0 - 46.0 % 40.3 41.2 38.5  Platelets 150.0 - 400.0 K/uL 129.0(L) 117.0(L) 139(L)    CMP Latest Ref Rng & Units 04/03/2017 01/23/2017 09/02/2016  Glucose 70 - 99 mg/dL 292(H) 118(H) 185(H)  BUN 6 - 23 mg/dL 15 17 16   Creatinine 0.40 - 1.20 mg/dL 0.94 0.82 0.85  Sodium 135 - 145 mEq/L 136 139 136  Potassium 3.5 - 5.1 mEq/L 4.1 4.4 4.2  Chloride 96 - 112 mEq/L 99 101 99  CO2 19 - 32 mEq/L 28 31 27   Calcium 8.4 - 10.5 mg/dL 10.0 9.6 10.4  Total  Protein 6.0 - 8.3 g/dL 7.7 7.2 7.6  Total Bilirubin 0.2 - 1.2 mg/dL 0.8 0.8 0.7  Alkaline Phos 39 - 117 U/L 106 - 77  AST 0 - 37 U/L 43(H) 37(H) 50(H)  ALT 0 - 35 U/L 40(H) 36(H) 44(H)     RADIOGRAPHIC STUDIES: I have personally reviewed the radiological images as listed and agreed with the findings in the report. US Abdomen Complete  Result Date: 03/23/2017 CLINICAL DATA:  Cirrhosis EXAM: ABDOMEN ULTRASOUND COMPLETE COMPARISON:  09/12/2016 FINDINGS: Gallbladder: Shadowing stones measuring up to 1.3 cm. Borderline to slightly thickened wall but negative sonographic Murphy. Common bile duct: Diameter: Slightly enlarged at 7.5 mm Liver: Coarse nodular echogenic liver consistent with cirrhosis. Mild intra hepatic biliary dilatation. Portal vein with normal direction of blood flow towards the liver. Nonocclusive thrombus. IVC: No abnormality visualized. Pancreas: Poorly visualized Spleen: Enlarged with volume of 517.5 cubic cm Right Kidney: Length: 11.6 cm. Echogenicity within normal limits. No mass or hydronephrosis visualized. Cortical thinning is present Left Kidney: Length: 12 cm. Echogenicity within normal limits. No mass or hydronephrosis visualized. Cortical thinning is present Abdominal aorta: No aneurysm visualized. Other findings: No significant ascites. IMPRESSION: 1. Cirrhosis of the liver. Interim finding of nonocclusive thrombus within the portal vein, new since ultrasound dated 09/12/2016. Splenomegaly, with decreased volume compare to the prior ultrasound. 2. Gallstones. Borderline wall thickening likely due to liver disease. Interim finding of mild intra hepatic biliary dilatation and slightly enlarged extrahepatic bile duct, if ductal obstruction is suspected, further evaluation with MRCP could be obtained. 3. Cortical thinning of both kidneys.  No hydronephrosis. These results will be called to the ordering clinician or representative by the Radiologist Assistant, and communication  documented in the PACS or zVision Dashboard. Electronically Signed   By: Madie Reno.D.  On: 03/23/2017 21:21   Mr 3d Recon At Scanner  Addendum Date: 04/21/2017   ADDENDUM REPORT: 04/21/2017 16:01 ADDENDUM: The original report was by Dr. Van Clines. The following addendum is by Dr. Van Clines: F=Upon further review in this case the context of the ultrasound of 03/23/2017, some of the areas of portal venous narrowing thought to originally be due to scarring/fibrosis are felt to likely be attributable to nonocclusive chronic thrombus, as evidenced by the lack of enhancement. This is most appreciable at the junction of the right and left portal veins to the main portal vein for example on image 42/1103. There may be a small amount of nonocclusive thrombus in the right posterior portal vein peripherally, and also near the takeoff of the splenic and SMV. The visualized thrombus is peripheral in location and nonocclusive, hence compatible with chronic thrombus. This appearance is similar to the ultrasound appearance from 03/23/2017. Electronically Signed   By: Van Clines M.D.   On: 04/21/2017 16:01   Result Date: 04/21/2017 CLINICAL DATA:  Hepatic cirrhosis with dilated bile duct. Cholelithiasis. Abdominal pain and nausea. EXAM: MRI ABDOMEN WITHOUT AND WITH CONTRAST (INCLUDING MRCP) TECHNIQUE: Multiplanar multisequence MR imaging of the abdomen was performed both before and after the administration of intravenous contrast. Heavily T2-weighted images of the biliary and pancreatic ducts were obtained, and three-dimensional MRCP images were rendered by post processing. CONTRAST:  96m MULTIHANCE GADOBENATE DIMEGLUMINE 529 MG/ML IV SOLN COMPARISON:  Abdominal ultrasound 03/23/2017 and CT abdomen from 04/06/2016 FINDINGS: Despite efforts by the technologist and patient, motion artifact is present on today's exam and could not be eliminated. This reduces exam sensitivity and specificity.  Lower chest: Prior median sternotomy. Hepatobiliary: Multiple small gallstones measuring up to 8 mm in long axis are settled dependently in the gallbladder. No stone is evident in the cystic duct. No choledocholithiasis is observed. The common hepatic duct measures up to 8 mm in diameter and the common bile duct up to 7 mm in diameter, borderline dilated. There is narrowing of the right anterior and right posterior ducts extending towards their confluence into the right hepatic duct is shown for example on consecutive images 49-56 of series 4 and also shown on image 42/400. This appears to primarily be attributable to extensive periportal fibrosis especially affecting this portion of the right-side of the liver, with accentuated T2 signal and subsequent extensive delayed enhancement in this region. Similar apparent fibrosis is present in the left hepatic lobe as well of the especially with a periportal distribution, as shown by the high signal intensity on images 11-25 of series 3 I do not observe worrisome arterial phase enhancement to suggest hepatocellular carcinoma and given the delayed phase enhancement strongly favor fibrosis over biliary tumor. There is a suggestion of mild gallbladder wall thickening. Pancreas: 3 mm fluid signal intensity structure along the pancreatic tail on image 28/10, probably a small dilated side duct or tiny postinflammatory lesion. Doubtful clinical significance. Spleen:  Unremarkable Adrenals/Urinary Tract:  Unremarkable Stomach/Bowel: Mild wall thickening in the gastric antrum, antritis is not excluded. Vascular/Lymphatic: Right gastric varices and uphill varices adjacent to the distal esophagus. Other:  No supplemental non-categorized findings. Musculoskeletal: Posterolateral rod and pedicle screw fixation at L3-4. Degenerative disc disease and spondylosis at L5-S1. IMPRESSION: 1. Narrowing of branches of the right ductal tree primarily due to extensive periportal fibrosis. No  filling defect is identified to suggest choledocholithiasis. Borderline dilated extrahepatic biliary tree with expected normal tapering of the distal CBD. 2. Small gallstones are present  in the gallbladder. Borderline gallbladder wall thickening. 3. Hepatic cirrhosis with portal venous hypertension manifesting as right gastric varices and uphill varices adjacent to the distal esophagus. 4. Mild wall thickening in the gastric antrum, antritis is not excluded. Electronically Signed: By: Van Clines M.D. On: 04/07/2017 10:22   Mr Abdomen Mrcp Moise Boring Contast  Addendum Date: 04/21/2017   ADDENDUM REPORT: 04/21/2017 16:01 ADDENDUM: The original report was by Dr. Van Clines. The following addendum is by Dr. Van Clines: F=Upon further review in this case the context of the ultrasound of 03/23/2017, some of the areas of portal venous narrowing thought to originally be due to scarring/fibrosis are felt to likely be attributable to nonocclusive chronic thrombus, as evidenced by the lack of enhancement. This is most appreciable at the junction of the right and left portal veins to the main portal vein for example on image 42/1103. There may be a small amount of nonocclusive thrombus in the right posterior portal vein peripherally, and also near the takeoff of the splenic and SMV. The visualized thrombus is peripheral in location and nonocclusive, hence compatible with chronic thrombus. This appearance is similar to the ultrasound appearance from 03/23/2017. Electronically Signed   By: Van Clines M.D.   On: 04/21/2017 16:01   Result Date: 04/21/2017 CLINICAL DATA:  Hepatic cirrhosis with dilated bile duct. Cholelithiasis. Abdominal pain and nausea. EXAM: MRI ABDOMEN WITHOUT AND WITH CONTRAST (INCLUDING MRCP) TECHNIQUE: Multiplanar multisequence MR imaging of the abdomen was performed both before and after the administration of intravenous contrast. Heavily T2-weighted images of the biliary and  pancreatic ducts were obtained, and three-dimensional MRCP images were rendered by post processing. CONTRAST:  30m MULTIHANCE GADOBENATE DIMEGLUMINE 529 MG/ML IV SOLN COMPARISON:  Abdominal ultrasound 03/23/2017 and CT abdomen from 04/06/2016 FINDINGS: Despite efforts by the technologist and patient, motion artifact is present on today's exam and could not be eliminated. This reduces exam sensitivity and specificity. Lower chest: Prior median sternotomy. Hepatobiliary: Multiple small gallstones measuring up to 8 mm in long axis are settled dependently in the gallbladder. No stone is evident in the cystic duct. No choledocholithiasis is observed. The common hepatic duct measures up to 8 mm in diameter and the common bile duct up to 7 mm in diameter, borderline dilated. There is narrowing of the right anterior and right posterior ducts extending towards their confluence into the right hepatic duct is shown for example on consecutive images 49-56 of series 4 and also shown on image 42/400. This appears to primarily be attributable to extensive periportal fibrosis especially affecting this portion of the right-side of the liver, with accentuated T2 signal and subsequent extensive delayed enhancement in this region. Similar apparent fibrosis is present in the left hepatic lobe as well of the especially with a periportal distribution, as shown by the high signal intensity on images 11-25 of series 3 I do not observe worrisome arterial phase enhancement to suggest hepatocellular carcinoma and given the delayed phase enhancement strongly favor fibrosis over biliary tumor. There is a suggestion of mild gallbladder wall thickening. Pancreas: 3 mm fluid signal intensity structure along the pancreatic tail on image 28/10, probably a small dilated side duct or tiny postinflammatory lesion. Doubtful clinical significance. Spleen:  Unremarkable Adrenals/Urinary Tract:  Unremarkable Stomach/Bowel: Mild wall thickening in the  gastric antrum, antritis is not excluded. Vascular/Lymphatic: Right gastric varices and uphill varices adjacent to the distal esophagus. Other:  No supplemental non-categorized findings. Musculoskeletal: Posterolateral rod and pedicle screw fixation at L3-4. Degenerative disc  disease and spondylosis at L5-S1. IMPRESSION: 1. Narrowing of branches of the right ductal tree primarily due to extensive periportal fibrosis. No filling defect is identified to suggest choledocholithiasis. Borderline dilated extrahepatic biliary tree with expected normal tapering of the distal CBD. 2. Small gallstones are present in the gallbladder. Borderline gallbladder wall thickening. 3. Hepatic cirrhosis with portal venous hypertension manifesting as right gastric varices and uphill varices adjacent to the distal esophagus. 4. Mild wall thickening in the gastric antrum, antritis is not excluded. Electronically Signed: By: Van Clines M.D. On: 04/07/2017 10:22    ASSESSMENT & PLAN:  [name] is a 71 y.o. female with a history of liver cirrhosis, CAD, hypercholesterolemia, type 2 DM, and cataracts.    1. Portal Vein Thrombosis -I reviewed her US abdomen from 03/23/17 that showed nonocclusive portal vein thrombosis, likely chronic.This is new since her last [place] in May 2018 -she has no other personal or family history of thrombosis, given her age, this in unlikely inheritable thrombophilia. This is likely related to her liver cirrhosis  -I reviewed her recently MRI of abdomen w wo contrast with radiologist Dr. Janeece Fitting today, he feels the portal vein thrombosis is not-occlusive, in distal portal main, with low thrombosis burden. This was not reported initially but he will addendum this findings. He feels this is likely chronic. -the benefit of anticoagulation in portal vein thrombosis is uncertain, especially in chronic thrombosis, non-symptomatic and nonocclusive thrombus. Given her significant esophageal  varices, I feel the risk of bleeding from anticoagulants is overweight the benefits of anticoagulation. I do not recommend ananticoagulant at this time.  -I recommend her to continue baby aspirin for now.  -if her portal vein thrombosis became worse, occlusive or symptomatic, we can reconsider anticoagulation.  -In the meantime I recommend to closely monitor her and she watch for acute abdominal pain or abdominal ascites. She will continue with [place] every 6 months, a close f/u US in next 3 month to ensure the thrombosis is not getting worse maybe reasonable  -She will continue followed by Dr. [name]. I agree with [place] and AFP screening for [facility]  -I will see her as needed.    2. Mild Thrombocytopenia -secondary to her liver cirrhosis and hypersplenism  -I advised her to be careful as she is at higher risk of bleeding.   3. Liver cirrhosis secondary to NASH  -f/u with dr. [name]   PLAN:  -Continue baby Aspirin daily, I do not recommend anticoagulation at this point  -Continue [place] and check up every 6 months with Dr. [name] -F/u with me as needed.     No problem-specific Assessment & Plan notes found for this encounter.    All questions were answered. The patient knows to call the clinic with any problems, questions or concerns. I spent 30 minutes counseling the patient face to face. The total time spent in the appointment was 40 minutes and more than 50% was on counseling.     Truitt Merle, MD 04/21/2017     This document serves as a record of services personally performed by Truitt Merle, MD. It was created on her behalf by Joslyn Devon, a trained medical scribe. The creation of this record is based on the scribe's personal observations and the provider's statements to them.    I have reviewed the above documentation for accuracy and completeness, and I agree with the above.

## 2017-04-22 DIAGNOSIS — I81 Portal vein thrombosis: Secondary | ICD-10-CM | POA: Insufficient documentation

## 2017-04-27 ENCOUNTER — Other Ambulatory Visit: Payer: Self-pay | Admitting: Physician Assistant

## 2017-05-11 ENCOUNTER — Telehealth: Payer: Self-pay | Admitting: *Deleted

## 2017-05-11 MED ORDER — INSULIN LISPRO 100 UNIT/ML (KWIKPEN): 8.0000 [IU] | PEN_INJECTOR | Freq: Three times a day (TID) | SUBCUTANEOUS | 3 refills | Status: DC

## 2017-05-11 NOTE — Telephone Encounter (Signed)
Patient notified and agreed. Medication list updated and Rx sent to pharmacy.

## 2017-05-11 NOTE — Telephone Encounter (Signed)
Ok to change to humalog flexpen if this is the one now covered by her insurance.   Same instructions as novolog flexpen.

## 2017-05-11 NOTE — Telephone Encounter (Signed)
Patient called and stated that her Novolog Flex Pen is too expensive (over $200 copay) and wants to know if there is an alternative. Patient Wonders about Humalog flex pen, if it would work the same, her son takes it. The medication is too expensive because Her husband also has to get his bladder medication filled and its over 400/month. Please Advise.

## 2017-05-12 ENCOUNTER — Other Ambulatory Visit: Payer: Self-pay | Admitting: Gastroenterology

## 2017-05-12 NOTE — Telephone Encounter (Signed)
Received fax from [facility] and they stated that Humalog not covered by patient's plan. The preferred alternative is [employer], Novolog, Novolog Pen, [employer], Fias Pen.   I called patient and she will call the insurance company to verify what they will cover and what she can afford and will call us back with the medication. Awaiting call.

## 2017-05-17 ENCOUNTER — Other Ambulatory Visit: Payer: Self-pay

## 2017-05-17 DIAGNOSIS — E785 Hyperlipidemia, unspecified: Secondary | ICD-10-CM

## 2017-05-17 DIAGNOSIS — E1165 Type 2 diabetes mellitus with hyperglycemia: Secondary | ICD-10-CM

## 2017-05-17 DIAGNOSIS — I10 Essential (primary) hypertension: Secondary | ICD-10-CM

## 2017-05-17 DIAGNOSIS — Z794 Long term (current) use of insulin: Secondary | ICD-10-CM

## 2017-05-25 ENCOUNTER — Other Ambulatory Visit: Payer: Medicare Other

## 2017-05-25 DIAGNOSIS — E1165 Type 2 diabetes mellitus with hyperglycemia: Secondary | ICD-10-CM | POA: Diagnosis not present

## 2017-05-25 DIAGNOSIS — I10 Essential (primary) hypertension: Secondary | ICD-10-CM

## 2017-05-25 DIAGNOSIS — E785 Hyperlipidemia, unspecified: Secondary | ICD-10-CM | POA: Diagnosis not present

## 2017-05-25 DIAGNOSIS — Z794 Long term (current) use of insulin: Secondary | ICD-10-CM | POA: Diagnosis not present

## 2017-05-26 LAB — COMPLETE METABOLIC PANEL WITH GFR
AG Ratio: 0.9 (calc) — ABNORMAL LOW (ref 1.0–2.5)
ALT: 40 U/L — ABNORMAL HIGH (ref 6–29)
AST: 39 U/L — ABNORMAL HIGH (ref 10–35)
Albumin: 3.4 g/dL — ABNORMAL LOW (ref 3.6–5.1)
Alkaline phosphatase (APISO): 112 U/L (ref 33–130)
BUN: 19 mg/dL (ref 7–25)
CO2: 29 mmol/L (ref 20–32)
Calcium: 9.5 mg/dL (ref 8.6–10.4)
Chloride: 102 mmol/L (ref 98–110)
Creat: 0.92 mg/dL (ref 0.60–0.93)
GFR, Est African American: 73 mL/min/{1.73_m2} (ref >=60)
GFR, Est Non African American: 63 mL/min/{1.73_m2} (ref >=60)
Globulin: 3.7 g/dL (calc) (ref 1.9–3.7)
Glucose, Bld: 161 mg/dL — ABNORMAL HIGH (ref 65–99)
Potassium: 4.4 mmol/L (ref 3.5–5.3)
Sodium: 137 mmol/L (ref 135–146)
Total Bilirubin: 0.9 mg/dL (ref 0.2–1.2)
Total Protein: 7.1 g/dL (ref 6.1–8.1)

## 2017-05-26 LAB — LIPID PANEL
Cholesterol: 200 mg/dL — ABNORMAL HIGH (ref <200)
HDL: 55 mg/dL (ref >50)
LDL Cholesterol (Calc): 128 mg/dL (calc) — ABNORMAL HIGH
Non-HDL Cholesterol (Calc): 145 mg/dL (calc) — ABNORMAL HIGH (ref <130)
Total CHOL/HDL Ratio: 3.6 (calc) (ref <5.0)
Triglycerides: 80 mg/dL (ref <150)

## 2017-05-26 LAB — HEMOGLOBIN A1C
Hgb A1c MFr Bld: 12.2 % of total Hgb — ABNORMAL HIGH (ref <5.7)
Mean Plasma Glucose: 303 (calc)
eAG (mmol/L): 16.8 (calc)

## 2017-05-29 ENCOUNTER — Encounter: Payer: Self-pay | Admitting: Internal Medicine

## 2017-05-29 ENCOUNTER — Ambulatory Visit (INDEPENDENT_AMBULATORY_CARE_PROVIDER_SITE_OTHER): Payer: Medicare Other

## 2017-05-29 ENCOUNTER — Ambulatory Visit (INDEPENDENT_AMBULATORY_CARE_PROVIDER_SITE_OTHER): Payer: Medicare Other | Admitting: Internal Medicine

## 2017-05-29 VITALS — BP 110/60 | HR 57 | Temp 97.7°F | Ht 63.0 in | Wt 141.0 lb

## 2017-05-29 DIAGNOSIS — E1169 Type 2 diabetes mellitus with other specified complication: Secondary | ICD-10-CM

## 2017-05-29 DIAGNOSIS — E785 Hyperlipidemia, unspecified: Secondary | ICD-10-CM | POA: Diagnosis not present

## 2017-05-29 DIAGNOSIS — K7460 Unspecified cirrhosis of liver: Secondary | ICD-10-CM

## 2017-05-29 DIAGNOSIS — Z0000 Encounter for general adult medical examination without abnormal findings: Secondary | ICD-10-CM

## 2017-05-29 DIAGNOSIS — Z794 Long term (current) use of insulin: Secondary | ICD-10-CM

## 2017-05-29 DIAGNOSIS — I10 Essential (primary) hypertension: Secondary | ICD-10-CM

## 2017-05-29 DIAGNOSIS — I2510 Atherosclerotic heart disease of native coronary artery without angina pectoris: Secondary | ICD-10-CM | POA: Diagnosis not present

## 2017-05-29 DIAGNOSIS — IMO0001 Reserved for inherently not codable concepts without codable children: Secondary | ICD-10-CM

## 2017-05-29 DIAGNOSIS — E1165 Type 2 diabetes mellitus with hyperglycemia: Secondary | ICD-10-CM

## 2017-05-29 DIAGNOSIS — M79632 Pain in left forearm: Secondary | ICD-10-CM | POA: Diagnosis not present

## 2017-05-29 MED ORDER — ZOSTER VAC RECOMB ADJUVANTED 50 MCG/0.5ML IM SUSR: 0.5000 mL | Freq: Once | INTRAMUSCULAR | 1 refills | Status: AC

## 2017-05-29 MED ORDER — EZETIMIBE 10 MG PO TABS: 10.0000 mg | ORAL_TABLET | Freq: Every day | ORAL | 3 refills | Status: DC

## 2017-05-29 NOTE — Patient Instructions (Signed)
[name] , Thank you for taking time to come for your Medicare Wellness Visit. I appreciate your ongoing commitment to your health goals. Please review the following plan we discussed and let me know if I can assist you in the future.   Screening recommendations/referrals: Colonoscopy up to date. Due 07/07/2021 Mammogram up to date. Due 09/20/2018 Bone Density up to date Recommended yearly ophthalmology/optometry visit for glaucoma screening and checkup Recommended yearly dental visit for hygiene and checkup  Vaccinations: Influenza vaccine up to date. Due 2019 fall season Pneumococcal vaccine up to date Tdap vaccine excluded Shingles vaccine due, prescription sent to pharmacy    Advanced directives: Advance directive discussed with you today. I have provided a copy for you to complete at home and have notarized. Once this is complete please bring a copy in to our office so we can scan it into your chart.   Conditions/risks identified: none  Next appointment: Dr. Mariea Clonts 05/29/2017 @ 2:30pm             Tyson Dense, RN 05/30/2018 @ 1:45pm  Preventive Care 65 Years and Older, Female Preventive care refers to lifestyle choices and visits with your health care provider that can promote health and wellness. What does preventive care include?  A yearly physical exam. This is also called an annual well check.  Dental exams once or twice a year.  Routine eye exams. Ask your health care provider how often you should have your eyes checked.  Personal lifestyle choices, including:  Daily care of your teeth and gums.  Regular physical activity.  Eating a healthy diet.  Avoiding tobacco and drug use.  Limiting alcohol use.  Practicing safe sex.  Taking low-dose aspirin every day.  Taking vitamin and mineral supplements as recommended by your health care provider. What happens during an annual well check? The services and screenings done by your health care provider during your  annual well check will depend on your age, overall health, lifestyle risk factors, and family history of disease. Counseling  Your health care provider may ask you questions about your:  Alcohol use.  Tobacco use.  Drug use.  Emotional well-being.  Home and relationship well-being.  Sexual activity.  Eating habits.  History of falls.  Memory and ability to understand (cognition).  Work and work Statistician.  Reproductive health. Screening  You may have the following tests or measurements:  Height, weight, and BMI.  Blood pressure.  Lipid and cholesterol levels. These may be checked every 5 years, or more frequently if you are over 43 years old.  Skin check.  Lung cancer screening. You may have this screening every year starting at age 74 if you have a 30-pack-year history of smoking and currently smoke or have quit within the past 15 years.  Fecal occult blood test (FOBT) of the stool. You may have this test every year starting at age 23.  Flexible sigmoidoscopy or colonoscopy. You may have a sigmoidoscopy every 5 years or a colonoscopy every 10 years starting at age 66.  Hepatitis C blood test.  Hepatitis B blood test.  Sexually transmitted disease (STD) testing.  Diabetes screening. This is done by checking your blood sugar (glucose) after you have not eaten for a while (fasting). You may have this done every 1-3 years.  Bone density scan. This is done to screen for osteoporosis. You may have this done starting at age 31.  Mammogram. This may be done every 1-2 years. Talk to your health care provider  about how often you should have regular mammograms. Talk with your health care provider about your test results, treatment options, and if necessary, the need for more tests. Vaccines  Your health care provider may recommend certain vaccines, such as:  Influenza vaccine. This is recommended every year.  Tetanus, diphtheria, and acellular pertussis (Tdap, Td)  vaccine. You may need a Td booster every 10 years.  Zoster vaccine. You may need this after age 39.  Pneumococcal 13-valent conjugate (PCV13) vaccine. One dose is recommended after age 68.  Pneumococcal polysaccharide (PPSV23) vaccine. One dose is recommended after age 47. Talk to your health care provider about which screenings and vaccines you need and how often you need them. This information is not intended to replace advice given to you by your health care provider. Make sure you discuss any questions you have with your health care provider. Document Released: 05/22/2015 Document Revised: 01/13/2016 Document Reviewed: 02/24/2015 Elsevier Interactive Patient Education  2017 [place] Prevention in the Home Falls can cause injuries. They can happen to people of all ages. There are many things you can do to make your home safe and to help prevent falls. What can I do on the outside of my home?  Regularly fix the edges of walkways and driveways and fix any cracks.  Remove anything that might make you trip as you walk through a door, such as a raised step or threshold.  Trim any bushes or trees on the path to your home.  Use bright outdoor lighting.  Clear any walking paths of anything that might make someone trip, such as rocks or tools.  Regularly check to see if handrails are loose or broken. Make sure that both sides of any steps have handrails.  Any raised decks and porches should have guardrails on the edges.  Have any leaves, snow, or ice cleared regularly.  Use sand or salt on walking paths during winter.  Clean up any spills in your garage right away. This includes oil or grease spills. What can I do in the bathroom?  Use night lights.  Install grab bars by the toilet and in the tub and shower. Do not use towel bars as grab bars.  Use non-skid mats or decals in the tub or shower.  If you need to sit down in the shower, use a plastic, non-slip  stool.  Keep the floor dry. Clean up any water that spills on the floor as soon as it happens.  Remove soap buildup in the tub or shower regularly.  Attach bath mats securely with double-sided non-slip rug tape.  Do not have throw rugs and other things on the floor that can make you trip. What can I do in the bedroom?  Use night lights.  Make sure that you have a light by your bed that is easy to reach.  Do not use any sheets or blankets that are too big for your bed. They should not hang down onto the floor.  Have a firm chair that has side arms. You can use this for support while you get dressed.  Do not have throw rugs and other things on the floor that can make you trip. What can I do in the kitchen?  Clean up any spills right away.  Avoid walking on wet floors.  Keep items that you use a lot in easy-to-reach places.  If you need to reach something above you, use a strong step stool that has a grab bar.  Keep electrical cords out of the way.  Do not use floor polish or wax that makes floors slippery. If you must use wax, use non-skid floor wax.  Do not have throw rugs and other things on the floor that can make you trip. What can I do with my stairs?  Do not leave any items on the stairs.  Make sure that there are handrails on both sides of the stairs and use them. Fix handrails that are broken or loose. Make sure that handrails are as long as the stairways.  Check any carpeting to make sure that it is firmly attached to the stairs. Fix any carpet that is loose or worn.  Avoid having throw rugs at the top or bottom of the stairs. If you do have throw rugs, attach them to the floor with carpet tape.  Make sure that you have a light switch at the top of the stairs and the bottom of the stairs. If you do not have them, ask someone to add them for you. What else can I do to help prevent falls?  Wear shoes that:  Do not have high heels.  Have rubber bottoms.  Are  comfortable and fit you well.  Are closed at the toe. Do not wear sandals.  If you use a stepladder:  Make sure that it is fully opened. Do not climb a closed stepladder.  Make sure that both sides of the stepladder are locked into place.  Ask someone to hold it for you, if possible.  Clearly mark and make sure that you can see:  Any grab bars or handrails.  First and last steps.  Where the edge of each step is.  Use tools that help you move around (mobility aids) if they are needed. These include:  Canes.  Walkers.  Scooters.  Crutches.  Turn on the lights when you go into a dark area. Replace any light bulbs as soon as they burn out.  Set up your furniture so you have a clear path. Avoid moving your furniture around.  If any of your floors are uneven, fix them.  If there are any pets around you, be aware of where they are.  Review your medicines with your doctor. Some medicines can make you feel dizzy. This can increase your chance of falling. Ask your doctor what other things that you can do to help prevent falls. This information is not intended to replace advice given to you by your health care provider. Make sure you discuss any questions you have with your health care provider. Document Released: 02/19/2009 Document Revised: 10/01/2015 Document Reviewed: 05/30/2014 Elsevier Interactive Patient Education  2017 [employer].

## 2017-05-29 NOTE — Progress Notes (Signed)
Subjective:   [name] is a 72 y.o. female who presents for Medicare Annual (Subsequent) preventive examination.  Last AWV-04/19/2016       Objective:     Vitals: BP 110/60 (BP Location: Left Arm, Patient Position: Sitting)   Pulse (!) 57   Temp 97.7 F (36.5 C) (Oral)   Ht 5' 3"  (1.6 m)   Wt 141 lb (64 kg)   SpO2 98%   BMI 24.98 kg/m   Body mass index is 24.98 kg/m.  Advanced Directives 05/29/2017 02/06/2017 01/26/2017 08/23/2016 07/07/2016 06/21/2016 05/18/2016  Does Patient Have a Medical Advance Directive? No Yes No No No No No  Does patient want to make changes to medical advance directive? - - No - Patient declined - - - -  Would patient like information on creating a medical advance directive? Yes (MAU/Ambulatory/Procedural Areas - Information given) - - - No - Patient declined - -    Tobacco Social History   Tobacco Use  Smoking Status Never Smoker  Smokeless Tobacco Never Used     Counseling given: Not Answered   Clinical Intake:  Pre-visit preparation completed: No  Pain : 0-10 Pain Score: 2  Pain Type: Acute pain Pain Location: Arm Pain Orientation: Left Pain Descriptors / Indicators: Aching Pain Onset: In the past 7 days Pain Frequency: Intermittent     Nutritional Status: BMI of 19-24  Normal Nutritional Risks: None Diabetes: Yes CBG done?: No Did pt. bring in CBG monitor from home?: No  How often do you need to have someone help you when you read instructions, pamphlets, or other written materials from your doctor or pharmacy?: 1 - Never What is the last grade level you completed in school?: 12th grade  Interpreter Needed?: No  Information entered by :: Tyson Dense, RN  Past Medical History:  Diagnosis Date  . Allergy   . Arthritis    neck  . Cataract    bilateral - MD monitoring cataracts  . CHF (congestive heart failure) ([place])   . Chronic kidney disease, stage I    DR OTTELIN  HX UTIS  . Cirrhosis ([place])   . Cramp of  limb   . Diabetes mellitus   . Dysphagia, unspecified(787.20)   . Dysuria   . Epistaxis   . GERD (gastroesophageal reflux disease)   . Heart murmur    NO CARDIOLOGIST  DX FOR YEARS ASYMPTOMATIC  . Lumbago   . Neoplasm of uncertain behavior of skin   . Nonspecific elevation of levels of transaminase or lactic acid dehydrogenase (LDH)   . Osteoarthrosis, unspecified whether generalized or localized, unspecified site   . Other and unspecified hyperlipidemia    diet controlled  . Pain in joint, shoulder region   . Paresthesias 04/01/2015  . Postablative ovarian failure   . Trochanteric bursitis of left hip 12/15/2015  . Type 2 diabetes mellitus without complication ([place])   . Unspecified essential hypertension    no meds   Past Surgical History:  Procedure Laterality Date  . CARDIAC CATHETERIZATION N/A 01/27/2016   Procedure: Left Heart Cath and Coronary Angiography;  Surgeon: Belva Crome, MD;  Location: [facility];  Service: Cardiovascular;  Laterality: N/A;  . COLONOSCOPY  2012   [facility] - polyp  . COLONOSCOPY WITH PROPOFOL N/A 07/07/2016   Procedure: COLONOSCOPY WITH PROPOFOL;  Surgeon: [name], MD;  Location: [facility];  Service: Endoscopy;  Laterality: N/A;  . CORONARY ARTERY BYPASS GRAFT N/A 01/28/2016   Procedure:  CORONARY ARTERY BYPASS GRAFTING (CABG) x 3 USING RIGHT LEG GREATER SAPHENOUS VEIN GRAFT;  Surgeon: Melrose Nakayama, MD;  Location: [facility];  Service: Open Heart Surgery;  Laterality: N/A;  . ENDOVEIN HARVEST OF GREATER SAPHENOUS VEIN Right 01/28/2016   Procedure: ENDOVEIN HARVEST OF GREATER SAPHENOUS VEIN;  Surgeon: Melrose Nakayama, MD;  Location: [facility];  Service: Open Heart Surgery;  Laterality: Right;  . ESOPHAGOGASTRODUODENOSCOPY (EGD) WITH PROPOFOL N/A 07/07/2016   Procedure: ESOPHAGOGASTRODUODENOSCOPY (EGD) WITH PROPOFOL;  Surgeon: [name], MD;  Location: [facility];  Service: Endoscopy;  Laterality: N/A;  . MAXIMUM ACCESS  (MAS)POSTERIOR LUMBAR INTERBODY FUSION (PLIF) 1 LEVEL Left 06/10/2015   Procedure: FOR MAXIMUM ACCESS (MAS) POSTERIOR LUMBAR INTERBODY FUSION (PLIF) LUMBAR THREE-FOUR EXTRAFORAMINAL MICRODISCECTOMY LUMBAR FIVE-SACRAL ONE LEFT;  Surgeon: Eustace Moore, MD;  Location: [facility];  Service: Neurosurgery;  Laterality: Left;  . TEE WITHOUT CARDIOVERSION N/A 01/28/2016   Procedure: TRANSESOPHAGEAL ECHOCARDIOGRAM (TEE);  Surgeon: Melrose Nakayama, MD;  Location: [facility];  Service: Open Heart Surgery;  Laterality: N/A;  . TUBAL LIGATION  1982   Dr Connye Burkitt  . UPPER GASTROINTESTINAL ENDOSCOPY    . VAGINAL HYSTERECTOMY  1997   Dr Rande Lawman   Family History  Problem Relation Age of Onset  . Lung cancer Father   . Arthritis Sister   . Arthritis Brother   . Heart disease Maternal Grandmother   . Heart disease Maternal Grandfather   . Heart disease Paternal Grandmother   . Heart disease Paternal Grandfather   . Breast cancer Mother   . Liver cancer Brother   . Breast cancer Maternal Aunt   . Colon cancer Neg Hx   . Esophageal cancer Neg Hx   . Rectal cancer Neg Hx   . Stomach cancer Neg Hx    Social History   Socioeconomic History  . Marital status: Married    Spouse name: None  . Number of children: 6  . Years of education: None  . Highest education level: None  Social Needs  . Financial resource strain: Not hard at all  . Food insecurity - worry: Never true  . Food insecurity - inability: Never true  . Transportation needs - medical: No  . Transportation needs - non-medical: No  Occupational History  . Occupation: retired  Tobacco Use  . Smoking status: Never Smoker  . Smokeless tobacco: Never Used  Substance and Sexual Activity  . Alcohol use: No  . Drug use: No  . Sexual activity: Yes    Partners: Male    Birth control/protection: Post-menopausal, Surgical    Comment: Hysterectomy  Other Topics Concern  . None  Social History Narrative  . None    Outpatient Encounter  Medications as of 05/29/2017  Medication Sig  . acetaminophen (TYLENOL) 325 MG tablet Take 2 tablets (650 mg total) by mouth every 6 (six) hours as needed for mild pain.  . Aromatic Inhalants (VICKS VAPOR IN) Vicks Vapor Rub apply small amount to outside of nose to help breathing  . aspirin EC 81 MG tablet Take 1 tablet (81 mg total) by mouth daily.  . Biotin 10000 MCG TABS Take 10,000 mcg by mouth every morning.  . calcium carbonate (OS-CAL) 600 MG TABS Take 600 mg by mouth 2 (two) times daily with a meal.    . cholecalciferol (VITAMIN D) 1000 UNITS tablet Take 2,000 Units by mouth daily.   . Cyanocobalamin (VITAMIN B 12 PO) Take 1,000 mcg by mouth daily.    [name] Kitchen  fish oil-omega-3 fatty acids 1000 MG capsule Take 1 g by mouth daily.    . furosemide (LASIX) 40 MG tablet Take 1 tablet (40 mg total) by mouth daily.  . insulin detemir (LEVEMIR) 100 UNIT/ML injection Inject 0.55 mLs (55 Units total) daily into the skin.  [name] Kitchen insulin lispro (HUMALOG KWIKPEN) 100 UNIT/ML KiwkPen Inject 0.08 mLs (8 Units total) into the skin 3 (three) times daily. With meals  . Insulin Pen Needle 32G X 4 MM MISC 1 each by Does not apply route 3 (three) times daily.  . Insulin Syringe-Needle U-100 (INSULIN SYRINGE .5CC/31GX5/16") 31G X 5/16" 0.5 ML MISC USE DAILY WITH INSULIN  . Insulin Syringe-Needle U-100 (INSULIN SYRINGE 1CC/30GX5/16") 30G X 5/16" 1 ML MISC Use daily with levemir injection DX E11.65  . MAGNESIUM PO Take 500 mg by mouth 2 (two) times daily in the am and at bedtime..  . Multiple Vitamins-Minerals (MULTIVITAMIN WITH MINERALS) tablet Take 1 tablet by mouth daily.    . nadolol (CORGARD) 80 MG tablet TAKE 1 TABLET(80 MG) BY MOUTH DAILY  . omeprazole (PRILOSEC) 20 MG capsule TAKE ONE CAPSULE BY MOUTH DAILY  . Polyethyl Glycol-Propyl Glycol (SYSTANE OP) Place 1 drop into both eyes 2 (two) times daily.  . potassium chloride SA (K-DUR,KLOR-CON) 20 MEQ tablet TAKE 1 TABLET BY MOUTH DAILY FOR POTASSIUM SUPPLEMENT  .  Probiotic Product (PROBIOTIC DAILY PO) Take 1 capsule by mouth daily. Digestive Advantage Probiotic  . spironolactone (ALDACTONE) 100 MG tablet Take 1 tablet (100 mg total) by mouth daily.   No facility-administered encounter medications on file as of 05/29/2017.     Activities of Daily Living In your present state of health, do you have any difficulty performing the following activities: 05/29/2017  Hearing? Y  Vision? N  Difficulty concentrating or making decisions? Y  Walking or climbing stairs? N  Dressing or bathing? N  Doing errands, shopping? N  Preparing Food and eating ? N  Using the Toilet? N  In the past six months, have you accidently leaked urine? N  Comment urgency  Do you have problems with loss of bowel control? N  Managing your Medications? N  Managing your Finances? N  Housekeeping or managing your Housekeeping? N  Some recent data might be hidden    Patient Care Team: Gayland Curry, DO as PCP - General (Geriatric Medicine) Berle Mull, MD as Consulting Physician (Family Medicine) Sharmon Revere as Physician Assistant (Cardiology) Calvert Cantor, MD as Consulting Physician (Ophthalmology) [name], MD as Attending Physician (Gastroenterology)    Assessment:   This is a routine wellness examination for Marie.  Exercise Activities and Dietary recommendations Current Exercise Habits: The patient does not participate in regular exercise at present, Exercise limited by: None identified  Goals    . Patient Stated     Pt would like to work on portion control        Fall Risk Fall Risk  05/29/2017 01/26/2017 06/21/2016 04/19/2016 03/31/2015  Falls in the past year? No No No No No   Is the patient's home free of loose throw rugs in walkways, pet beds, electrical cords, etc?   yes      Grab bars in the bathroom? yes      Handrails on the stairs?   yes      Adequate lighting?   yes  Timed Get Up and Go performed: 25 seconds, fall  risk  Depression Screen PHQ 2/9 Scores 05/29/2017 01/26/2017 04/19/2016 03/31/2015  PHQ -  2 Score 0 0 0 0     Cognitive Function MMSE - Mini Mental State Exam 05/29/2017 04/19/2016  Orientation to time 5 5  Orientation to Place 5 5  Registration 3 3  Attention/ Calculation 5 5  Recall 3 3  Language- name 2 objects 2 2  Language- repeat 1 1  Language- follow 3 step command 3 2  Language- read & follow direction 1 1  Write a sentence 1 1  Copy design 1 1  Total score 30 29        Immunization History  Administered Date(s) Administered  . Hep A / Hep B 06/24/2016, 07/04/2016, 07/21/2016  . Influenza Whole 02/28/2012  . Influenza, High Dose Seasonal PF 01/26/2017  . Influenza,inj,Quad PF,6+ Mos 03/27/2013, 01/16/2014, 03/31/2015, 02/26/2016  . Pneumococcal Conjugate-13 06/28/2011  . Pneumococcal Polysaccharide-23 04/19/2016  . Td 01/12/1999    Qualifies for Shingles Vaccine?yes, educated and prescription sent to pharmacy  Screening Tests Health Maintenance  Topic Date Due  . FOOT EXAM  03/30/2016  . URINE MICROALBUMIN  06/16/2017  . OPHTHALMOLOGY EXAM  08/04/2017  . HEMOGLOBIN A1C  11/22/2017  . MAMMOGRAM  09/16/2018  . COLONOSCOPY  07/07/2021  . INFLUENZA VACCINE  Completed  . DEXA SCAN  Completed  . Hepatitis C Screening  Completed  . PNA vac Low Risk Adult  Completed  . TETANUS/TDAP  Discontinued    Cancer Screenings: Lung: Low Dose CT Chest recommended if Age 72-80 years, 30 pack-year currently smoking OR have quit w/in 15years. Patient does not qualify. Breast:  Up to date on Mammogram? Yes   Up to date of Bone Density/Dexa? Yes Colorectal: up to date  Additional Screenings:  Hepatitis B/HIV/Syphillis: declined Hepatitis C Screening:  declined     Plan:    I have personally reviewed and addressed the Medicare Annual Wellness questionnaire and have noted the following in the patient's chart:  A. Medical and social history B. Use of alcohol, tobacco or  illicit drugs  C. Current medications and supplements D. Functional ability and status E.  Nutritional status F.  Physical activity G. Advance directives H. List of other physicians I.  Hospitalizations, surgeries, and ER visits in previous 12 months J.  [place] to include hearing, vision, cognitive, depression L. Referrals and appointments - none  In addition, I have reviewed and discussed with patient certain preventive protocols, quality metrics, and best practice recommendations. A written personalized care plan for preventive services as well as general preventive health recommendations were provided to patient.  See attached scanned questionnaire for additional information.   Signed,   Tyson Dense, RN Nurse Health Advisor   Quick Notes   Health Maintenance: Shingrix due, ordered. Foot exam due     Abnormal Screen: MMSE 30/30. Passed clock drawing     Patient Concerns: none     Nurse Concerns: none

## 2017-05-29 NOTE — Progress Notes (Signed)
Location:  [facility] clinic Provider:  Tiffany L. Mariea Clonts, D.O., C.M.D.  Code Status: Full Code Goals of Care:  Advanced Directives 05/29/2017  Does Patient Have a Medical Advance Directive? No  Does patient want to make changes to medical advance directive? -  Would patient like information on creating a medical advance directive? Yes (MAU/Ambulatory/Procedural Areas - Information given)     Chief Complaint  Patient presents with  . Medical Management of Chronic Issues    22mh follow-up    HPI: Patient is a 72y.o. female seen today for medical management of chronic diseases. Today in office Mrs. VBuegeis pleasant and overall feeling well. She is without complaints, but is interested in her recent labs.   Diabetes: She does not have her blood sugar meter today in the office, but she can recall that her fast blood sugars have a wide range 90's-150's. She states she is taking her medicine correctly. In further discussion it is found that she is not eating three meals a day and therefore is missing her 8 units of novolog on a regular bases. In a diet recall she demonstrates healthy choices, with some heavier carbohydrates at times. (See Below)   Fasting blood sugar: 90's-150's more on the 150's end, than 90's.  Eating recalling: Oatmeal with blueberries and walnuts, egg, or cereal Lunch: Soup: shredded chicken with veggie broth, cheese and crackers (usually not hungry) Dinner: Pizza, burger (trying to avoid the bun) Snacks: none  She does not exercise regularly. But states she does try to walk, and when the weather is warmer this will be easier for her to do. She does tend to her home with housekeeping duties that keep her mobile. She does also enjoying walking all the aisles at the store when she goes out shopping.   Hyperlipidemia: She has not started the Repatha, which she originally said she was worried about the liver impact. Then she confessed that it was mostly cost related.  Left arm  pain-predominately in the forearm anterior aspect. No known injury or fall. No bruising, but feels like a bruise. There is no motion that causes pain. There have been no modifying factors.    Past Medical History:  Diagnosis Date  . Allergy   . Arthritis    neck  . Cataract    bilateral - MD monitoring cataracts  . CHF (congestive heart failure) ([place]   . Chronic kidney disease, stage I    DR OTTELIN  HX UTIS  . Cirrhosis ([place]   . Cramp of limb   . Diabetes mellitus   . Dysphagia, unspecified(787.20)   . Dysuria   . Epistaxis   . GERD (gastroesophageal reflux disease)   . Heart murmur    NO CARDIOLOGIST  DX FOR YEARS ASYMPTOMATIC  . Lumbago   . Neoplasm of uncertain behavior of skin   . Nonspecific elevation of levels of transaminase or lactic acid dehydrogenase (LDH)   . Osteoarthrosis, unspecified whether generalized or localized, unspecified site   . Other and unspecified hyperlipidemia    diet controlled  . Pain in joint, shoulder region   . Paresthesias 04/01/2015  . Postablative ovarian failure   . Trochanteric bursitis of left hip 12/15/2015  . Type 2 diabetes mellitus without complication ([place]   . Unspecified essential hypertension    no meds    Past Surgical History:  Procedure Laterality Date  . CARDIAC CATHETERIZATION N/A 01/27/2016   Procedure: Left Heart Cath and Coronary Angiography;  Surgeon: HLynnell Dike  Tamala Julian, MD;  Location: [facility];  Service: Cardiovascular;  Laterality: N/A;  . COLONOSCOPY  2012   [facility] - polyp  . COLONOSCOPY WITH PROPOFOL N/A 07/07/2016   Procedure: COLONOSCOPY WITH PROPOFOL;  Surgeon: [name], MD;  Location: [facility];  Service: Endoscopy;  Laterality: N/A;  . CORONARY ARTERY BYPASS GRAFT N/A 01/28/2016   Procedure: CORONARY ARTERY BYPASS GRAFTING (CABG) x 3 USING RIGHT LEG GREATER SAPHENOUS VEIN GRAFT;  Surgeon: Melrose Nakayama, MD;  Location: [place];  Service: Open Heart Surgery;  Laterality: N/A;  . ENDOVEIN  HARVEST OF GREATER SAPHENOUS VEIN Right 01/28/2016   Procedure: ENDOVEIN HARVEST OF GREATER SAPHENOUS VEIN;  Surgeon: Melrose Nakayama, MD;  Location: [place];  Service: Open Heart Surgery;  Laterality: Right;  . ESOPHAGOGASTRODUODENOSCOPY (EGD) WITH PROPOFOL N/A 07/07/2016   Procedure: ESOPHAGOGASTRODUODENOSCOPY (EGD) WITH PROPOFOL;  Surgeon: [name], MD;  Location: [facility];  Service: Endoscopy;  Laterality: N/A;  . MAXIMUM ACCESS (MAS)POSTERIOR LUMBAR INTERBODY FUSION (PLIF) 1 LEVEL Left 06/10/2015   Procedure: FOR MAXIMUM ACCESS (MAS) POSTERIOR LUMBAR INTERBODY FUSION (PLIF) LUMBAR THREE-FOUR EXTRAFORAMINAL MICRODISCECTOMY LUMBAR FIVE-SACRAL ONE LEFT;  Surgeon: Eustace Moore, MD;  Location: [place] [facility];  Service: Neurosurgery;  Laterality: Left;  . TEE WITHOUT CARDIOVERSION N/A 01/28/2016   Procedure: TRANSESOPHAGEAL ECHOCARDIOGRAM (TEE);  Surgeon: Melrose Nakayama, MD;  Location: [place];  Service: Open Heart Surgery;  Laterality: N/A;  . TUBAL LIGATION  1982   Dr Connye Burkitt  . UPPER GASTROINTESTINAL ENDOSCOPY    . VAGINAL HYSTERECTOMY  1997   Dr Rande Lawman    Allergies  Allergen Reactions  . Kiwi Extract Anaphylaxis  . Tdap [Diphth-Acell Pertussis-Tetanus] Swelling and Other (See Comments)    Swelling at injection site, gets very hot  . Statins     RHABDOMYOLYSIS  . Latex Itching, Dermatitis and Rash  . Tramadol Nausea And Vomiting    Outpatient Encounter Medications as of 05/29/2017  Medication Sig  . acetaminophen (TYLENOL) 325 MG tablet Take 2 tablets (650 mg total) by mouth every 6 (six) hours as needed for mild pain.  . Aromatic Inhalants (VICKS VAPOR IN) Vicks Vapor Rub apply small amount to outside of nose to help breathing  . aspirin EC 81 MG tablet Take 1 tablet (81 mg total) by mouth daily.  . Biotin 10000 MCG TABS Take 10,000 mcg by mouth every morning.  . calcium carbonate (OS-CAL) 600 MG TABS Take 600 mg by mouth 2 (two) times daily with a meal.    .  cholecalciferol (VITAMIN D) 1000 UNITS tablet Take 2,000 Units by mouth daily.   . Cyanocobalamin (VITAMIN B 12 PO) Take 1,000 mcg by mouth daily.    . fish oil-omega-3 fatty acids 1000 MG capsule Take 1 g by mouth daily.    . furosemide (LASIX) 40 MG tablet Take 1 tablet (40 mg total) by mouth daily.  . insulin detemir (LEVEMIR) 100 UNIT/ML injection Inject 0.55 mLs (55 Units total) daily into the skin.  [name] Kitchen insulin lispro (HUMALOG KWIKPEN) 100 UNIT/ML KiwkPen Inject 0.08 mLs (8 Units total) into the skin 3 (three) times daily. With meals  . Insulin Pen Needle 32G X 4 MM MISC 1 each by Does not apply route 3 (three) times daily.  . Insulin Syringe-Needle U-100 (INSULIN SYRINGE .5CC/31GX5/16") 31G X 5/16" 0.5 ML MISC USE DAILY WITH INSULIN  . Insulin Syringe-Needle U-100 (INSULIN SYRINGE 1CC/30GX5/16") 30G X 5/16" 1 ML MISC Use daily with levemir injection DX E11.65  .  MAGNESIUM PO Take 500 mg by mouth 2 (two) times daily in the am and at bedtime..  . Multiple Vitamins-Minerals (MULTIVITAMIN WITH MINERALS) tablet Take 1 tablet by mouth daily.    . nadolol (CORGARD) 80 MG tablet TAKE 1 TABLET(80 MG) BY MOUTH DAILY  . omeprazole (PRILOSEC) 20 MG capsule TAKE ONE CAPSULE BY MOUTH DAILY  . Polyethyl Glycol-Propyl Glycol (SYSTANE OP) Place 1 drop into both eyes 2 (two) times daily.  . potassium chloride SA (K-DUR,KLOR-CON) 20 MEQ tablet TAKE 1 TABLET BY MOUTH DAILY FOR POTASSIUM SUPPLEMENT  . Probiotic Product (PROBIOTIC DAILY PO) Take 1 capsule by mouth daily. Digestive Advantage Probiotic  . spironolactone (ALDACTONE) 100 MG tablet Take 1 tablet (100 mg total) by mouth daily.  [name] Kitchen Zoster Vaccine Adjuvanted [facility]) injection Inject 0.5 mLs into the muscle once for 1 dose.   No facility-administered encounter medications on file as of 05/29/2017.     Review of Systems:  Review of Systems  Constitutional: Negative.   HENT: Positive for hearing loss. Negative for ear pain and sinus pain.        Post  nasal drip---without chronic cough  Wears hearing aids bilaterally  Eyes: Negative.   Respiratory: Negative.   Cardiovascular: Negative.   Gastrointestinal: Negative.  Negative for blood in stool.  Genitourinary: Negative for frequency and hematuria.  Musculoskeletal: Positive for joint pain.       Arthritis   Neurological: Negative.   Psychiatric/Behavioral: Negative for depression and memory loss. The patient has insomnia. The patient is not nervous/anxious.        Sleep disturbance related to getting up and using the restroom.   Does have trouble falling asleep.     Health Maintenance  Topic Date Due  . FOOT EXAM  03/30/2016  . URINE MICROALBUMIN  06/16/2017  . OPHTHALMOLOGY EXAM  08/04/2017  . HEMOGLOBIN A1C  11/22/2017  . MAMMOGRAM  09/16/2018  . COLONOSCOPY  07/07/2021  . INFLUENZA VACCINE  Completed  . DEXA SCAN  Completed  . Hepatitis C Screening  Completed  . PNA vac Low Risk Adult  Completed  . TETANUS/TDAP  Discontinued    Physical Exam: Vitals:   05/29/17 1437  BP: 110/60  Pulse: (!) 57  Temp: 97.7 F (36.5 C)  TempSrc: Oral  SpO2: 98%  Weight: 141 lb (64 kg)  Height: 5' 3"  (1.6 m)   Body mass index is 24.98 kg/m. Physical Exam  Constitutional: She is oriented to person, place, and time. She appears well-developed and well-nourished.  HENT:  Head: Normocephalic and atraumatic.  Eyes: Conjunctivae are normal.  Neck: Normal range of motion. Neck supple.  Cardiovascular: Normal rate, regular rhythm, normal heart sounds and intact distal pulses.  Pulmonary/Chest: Effort normal and breath sounds normal.  Abdominal: Soft. Bowel sounds are normal.  Musculoskeletal: Normal range of motion.  Neurological: She is alert and oriented to person, place, and time.  Skin: Skin is warm and dry.  Psychiatric: She has a normal mood and affect. Her behavior is normal. Judgment and thought content normal.    Labs reviewed: Basic Metabolic Panel: Recent Labs     01/23/17 0923 04/03/17 1601 05/25/17 0940  NA 139 136 137  K 4.4 4.1 4.4  CL 101 99 102  CO2 31 28 29   GLUCOSE 118* 292* 161*  BUN 17 15 19   CREATININE 0.82 0.94 0.92  CALCIUM 9.6 10.0 9.5   Liver Function Tests: Recent Labs    08/19/16 0903 09/02/16 0926 01/23/17 0923 04/03/17 1601  05/25/17 0940  AST 49* 50* 37* 43* 39*  ALT 41* 44* 36* 40* 40*  ALKPHOS 70 77  --  106  --   BILITOT 0.6 0.7 0.8 0.8 0.9  PROT 7.0 7.6 7.2 7.7 7.1  ALBUMIN 3.3* 3.6  --  3.5  --    No results for input(s): LIPASE, AMYLASE in the last 8760 hours. No results for input(s): AMMONIA in the last 8760 hours. CBC: Recent Labs    08/19/16 0903 09/02/16 0926 04/03/17 1601  WBC 4.3 5.5 5.6  NEUTROABS 2,193 2.9 3.2  HGB 12.6 13.6 13.3  HCT 38.5 41.2 40.3  MCV 88.3 89.9 92.1  PLT 139* 117.0* 129.0*   Lipid Panel: Recent Labs    07/08/16 1045 01/23/17 0923 05/25/17 0940  CHOL 185 195 200*  HDL 55 54 55  LDLCALC 117*  --   --   TRIG 67 79 80  CHOLHDL 3.4 3.6 3.6   Lab Results  Component Value Date   HGBA1C 12.2 (H) 05/25/2017    Procedures since last visit: No results found.  Assessment/Plan  1. Hyperlipidemia associated with type 2 diabetes mellitus ([place]) Recent labs demonstrate an elevation in LDL and borderline cholesterol at 200, thought her HDL is good at 55. This is a good point for reducing her risk, but being Diabetic she needs to have an LDL of 70 or less. She can not tolerate statins as she develops rhabdo and myopathy. She had previously discussed Repatha, but this is costly for her. Educated her about eating a well balanced diet higher in fruits and vegetables. Due to her cirrhosis and cost of Repatha, and inability to take a statin we will try Zetia at this time. It is the hopes that along with diet management and Zetia there will be some improvement in her LDL levels. This should not impact her current liver function, but to be prudent we will check a hepatic function in 2  weeks to assess.   - ezetimibe (ZETIA) 10 MG tablet; Take 1 tablet (10 mg total) by mouth daily.  Dispense: 30 tablet; Refill: 3 - Hepatic function panel; Future  2. Uncontrolled diabetes mellitus without complication, with long-term current use of insulin (H[facility]) Her A1c was 12.2%, which is elevated from previous elevation of 10.7% in Sept of 2018. This has been a trend over the last two years with steady elevation growth in the A1C. She states she is not checking her blood sugar as previously discussed with Cathey. She admits to not eating three meals daily and therefore has not been taking her Humalog. She is not aware of what her daily blood sugars are. She has been advised about eating a more balanced diet. Also spoke at length to develop a plan for reducing the A1c. This plan includes her keeping a log of her blood sugars until she returns to clinic on Feb 4th to see Cathey. She will check her blood sugar three times daily plus at bedtime. This will give [place] a more accurate picture of her sugars and how we can adjust her insulin regimen.   3. Cirrhosis of liver without ascites, unspecified hepatic cirrhosis type ([place]) Her current function has been stable over the last several lab checks. She is very aware of her liver diagnosis and is vigilant about keep the best function possible. She needs encouragement to take medicines as she is concerned that they might be harmful to her liver. Advised her to always question new medications with any provider, but  to try to take the medicines as prescribed to help with management of her other chronic conditions. She understands this. Will be starting Zetia and will order follow up hepatic function to assess if this impacts her liver. There is no hepatic dosing. Will maintain her on spironolactone, nadolol as there are no signs of ascites or asterixis.   - Hepatic function panel; Future  4. Essential hypertension Today in office she is stable with her Blood  pressure. No changes are need to her medication regimen.    5. Coronary artery disease involving native coronary artery of native heart without angina pectoris She is stable and without signs of CAD today in office. Unfortunately her LDL is 128 and should be <70 with DM as well as ASCVD. Will add Zetia as discussed above. Furthermore, will maintain asa and her beta blocker as well.    Labs/tests ordered:   Orders Placed This Encounter  Procedures  . Hepatic function panel    Standing Status:   Future    Standing Expiration Date:   05/29/2018     Next appt:  06/12/2017- Oretha Ellis  09/25/2017-Dr Jamal Maes, MSN, RN, DNP [facility] [phone], [place] [address] Cell Phone (Mon-Fri 8am-5pm):  [phone] On Call:  [phone] & follow prompts after 5pm & weekends Office Phone:  [phone] Office Fax:  [phone]

## 2017-05-29 NOTE — Patient Instructions (Addendum)
Please continue to check your blood sugar in the mornings, as well as start checking your blood sugar prior to each meal and before bedtime.  If blood sugars prior to eating are greater than 200's please call the office back to discuss the need to insulin changes.   Please try and eat a well balanced diet on most days. If you do not want to eat a meal, please check you blood sugar so we can make sure you are being covered well.   Please start the zetia for your cholesterol.  We will recheck your liver the day you come to see [place].

## 2017-06-01 ENCOUNTER — Other Ambulatory Visit: Payer: Self-pay | Admitting: *Deleted

## 2017-06-01 ENCOUNTER — Other Ambulatory Visit: Payer: Self-pay | Admitting: Internal Medicine

## 2017-06-01 DIAGNOSIS — E876 Hypokalemia: Secondary | ICD-10-CM

## 2017-06-01 MED ORDER — GLUCOSE BLOOD VI STRP: ORAL_STRIP | 3 refills | Status: DC

## 2017-06-01 NOTE — Telephone Encounter (Signed)
Patient requested refill. Faxed.  

## 2017-06-08 ENCOUNTER — Other Ambulatory Visit: Payer: Self-pay | Admitting: Gastroenterology

## 2017-06-12 ENCOUNTER — Ambulatory Visit: Payer: Medicare Other | Admitting: Pharmacotherapy

## 2017-06-12 NOTE — Telephone Encounter (Signed)
Noted...cdavis

## 2017-06-19 ENCOUNTER — Encounter: Payer: Self-pay | Admitting: Pharmacotherapy

## 2017-06-19 ENCOUNTER — Ambulatory Visit (INDEPENDENT_AMBULATORY_CARE_PROVIDER_SITE_OTHER): Payer: Medicare Other | Admitting: Pharmacotherapy

## 2017-06-19 VITALS — BP 116/60 | HR 47 | Ht 63.0 in | Wt 142.0 lb

## 2017-06-19 DIAGNOSIS — Z794 Long term (current) use of insulin: Secondary | ICD-10-CM

## 2017-06-19 DIAGNOSIS — R300 Dysuria: Secondary | ICD-10-CM

## 2017-06-19 DIAGNOSIS — I10 Essential (primary) hypertension: Secondary | ICD-10-CM | POA: Diagnosis not present

## 2017-06-19 DIAGNOSIS — E785 Hyperlipidemia, unspecified: Secondary | ICD-10-CM

## 2017-06-19 DIAGNOSIS — IMO0001 Reserved for inherently not codable concepts without codable children: Secondary | ICD-10-CM

## 2017-06-19 DIAGNOSIS — I2510 Atherosclerotic heart disease of native coronary artery without angina pectoris: Secondary | ICD-10-CM

## 2017-06-19 DIAGNOSIS — E1165 Type 2 diabetes mellitus with hyperglycemia: Secondary | ICD-10-CM | POA: Diagnosis not present

## 2017-06-19 DIAGNOSIS — E1169 Type 2 diabetes mellitus with other specified complication: Secondary | ICD-10-CM

## 2017-06-19 MED ORDER — INSULIN LISPRO 100 UNIT/ML (KWIKPEN): PEN_INJECTOR | SUBCUTANEOUS | 3 refills | Status: DC

## 2017-06-19 NOTE — Patient Instructions (Signed)
Increase Humalog to 12 units breakfast, 8 units lunch and 12 units super

## 2017-06-19 NOTE — Progress Notes (Signed)
  Subjective:    [name] is a 72 y.o.white female who presents for follow-up of Type 2 diabetes mellitus.   A1C: 12.2% EGFR: 66m/min  She admitted to Dr RMariea Clontslast month that she was not SMBG as she should and was actually missing several doses of her Novolog. She was to have started Zetia for her dyslipidemia at that visit.  Did not start Repatha due to cost issues. She says her Zetia is making her dizzy, so she takes it at night.  She says she is eating 3 meals per day and is taking Novolog 8 units with each meal. Levemir 55 units daily Average BG: 1334mdl - only SMBG 15 times in 14 days.  Most are fasting values. 1 episode of hypoglycemia - 6266ml - fasting value When she has checked BG after a meal - 246-342 mg/dl  Food recall:  Breakfast - eggs, bacon, oatmeal or cereal Lunch - sandwich, cheese & crackers, PB & crackers Supper - veggies, hamburger  Walks "some" for exercise. Has blurry vision.  Has eye exam scheduled for April 2019. (Dr DigBing Plumeas cataracts. Denies problems with feet No peripheral edema Nocturia 3 times per night. Staying well hydrated. Drinking a lot of juice.   Review of Systems A comprehensive review of systems was negative except for: Eyes: positive for cataracts and contacts/glasses Genitourinary: positive for nocturia Neurological: positive for dizziness Endocrine: positive for diabetic symptoms including blurry vision and increased fatigue    Objective:    BP 116/60   Pulse (!) 47 Comment: 46 on manual check  Ht 5' 3"  (1.6 m)   Wt 142 lb (64.4 kg)   SpO2 94%   BMI 25.15 kg/m   General:  alert, cooperative and no distress  Oropharynx: normal findings: lips normal without lesions and gums healthy   Eyes:  negative findings: lids and lashes normal and conjunctivae and sclerae normal   Ears:  external ears normal        Lung: clear to auscultation bilaterally  Heart:  regular rate and rhythm     Extremities: extremities  normal, atraumatic, no cyanosis or edema  Skin: warm and dry, no hyperpigmentation, vitiligo, or suspicious lesions     Neuro: mental status, speech normal, alert and oriented x3 and gait and station normal   Lab Review Glucose, Bld (mg/dL)  Date Value  05/25/2017 161 (H)  04/03/2017 292 (H)  01/23/2017 118 (H)   CO2  Date Value  05/25/2017 29 mmol/L  04/03/2017 28 mEq/L  01/23/2017 31 mmol/L   BUN (mg/dL)  Date Value  05/25/2017 19  04/03/2017 15  01/23/2017 17  07/24/2015 11  03/27/2015 15  11/07/2014 15   Creat (mg/dL)  Date Value  05/25/2017 0.92  01/23/2017 0.82  08/19/2016 0.88   Creatinine, Ser (mg/dL)  Date Value  04/03/2017 0.94  09/02/2016 0.85       Assessment:    Diabetes Mellitus type II, under poor control.   Dysuria - has burning with urination BP at goal <130/80 Has started Zetia.   Plan:    1.  Rx changes: Increase Novolog to 12 units breakfast, 8 units lunch, 12 units supper  2.  Continue Levemir 55 units daily 3.  Will check UA today due to dysuria / burning. 4.  Will check LFT due to cirrhosis and starting Zetia 5.  Counseled on nutrition goals. 6.  Exercise goal is 30-45 minutes 5 x week.

## 2017-06-20 LAB — URINALYSIS, ROUTINE W REFLEX MICROSCOPIC
Bacteria, UA: NONE SEEN /HPF
Bilirubin Urine: NEGATIVE
Glucose, UA: NEGATIVE
Hyaline Cast: NONE SEEN /LPF
Ketones, ur: NEGATIVE
Nitrite: NEGATIVE
Protein, ur: NEGATIVE
RBC / HPF: NONE SEEN /HPF (ref 0–2)
Specific Gravity, Urine: 1.007 (ref 1.001–1.03)
Squamous Epithelial / HPF: NONE SEEN /HPF (ref <=5)
pH: 7 (ref 5.0–8.0)

## 2017-06-20 LAB — HEPATIC FUNCTION PANEL
AG Ratio: 0.9 (calc) — ABNORMAL LOW (ref 1.0–2.5)
ALT: 77 U/L — ABNORMAL HIGH (ref 6–29)
AST: 78 U/L — ABNORMAL HIGH (ref 10–35)
Albumin: 3.5 g/dL — ABNORMAL LOW (ref 3.6–5.1)
Alkaline phosphatase (APISO): 134 U/L — ABNORMAL HIGH (ref 33–130)
Bilirubin, Direct: 0.2 mg/dL (ref 0.0–0.2)
Globulin: 4.1 g/dL (calc) — ABNORMAL HIGH (ref 1.9–3.7)
Indirect Bilirubin: 0.5 mg/dL (calc) (ref 0.2–1.2)
Total Bilirubin: 0.7 mg/dL (ref 0.2–1.2)
Total Protein: 7.6 g/dL (ref 6.1–8.1)

## 2017-06-21 ENCOUNTER — Telehealth: Payer: Self-pay | Admitting: *Deleted

## 2017-06-21 NOTE — Telephone Encounter (Signed)
Received fax from [facility] [phone] stating Humalog Roxine Caddy 100 is APPROVED 05/07/17-05/08/18 Reference #: [id number] Member ID: [id number]

## 2017-06-21 NOTE — Telephone Encounter (Signed)
Received Prior authorization for Humalog from [place]. Initiated through [employer] [id number] Form faxed to Insurance through [employer]. Awaiting determination.

## 2017-06-22 ENCOUNTER — Other Ambulatory Visit: Payer: Self-pay | Admitting: Internal Medicine

## 2017-06-26 ENCOUNTER — Telehealth: Payer: Self-pay

## 2017-06-26 DIAGNOSIS — R8290 Unspecified abnormal findings in urine: Secondary | ICD-10-CM

## 2017-06-26 DIAGNOSIS — R300 Dysuria: Secondary | ICD-10-CM

## 2017-06-26 NOTE — Telephone Encounter (Signed)
Left message on voicemail for patient to return call when available   

## 2017-06-26 NOTE — Telephone Encounter (Signed)
-----   Message from Gayland Curry, DO sent at 06/26/2017 12:00 PM EST ----- Was culture done?

## 2017-06-26 NOTE — Telephone Encounter (Signed)
Discussed results with patient, scheduled appointment for tomorrow to collect sample for urine culture

## 2017-06-27 ENCOUNTER — Other Ambulatory Visit: Payer: Medicare Other

## 2017-06-27 DIAGNOSIS — R300 Dysuria: Secondary | ICD-10-CM

## 2017-06-27 DIAGNOSIS — R8290 Unspecified abnormal findings in urine: Secondary | ICD-10-CM | POA: Diagnosis not present

## 2017-06-29 LAB — URINE CULTURE
MICRO NUMBER:: 90218253
SPECIMEN QUALITY:: ADEQUATE

## 2017-07-03 ENCOUNTER — Telehealth: Payer: Self-pay | Admitting: *Deleted

## 2017-07-03 MED ORDER — CEFDINIR 300 MG PO CAPS: 300.0000 mg | ORAL_CAPSULE | Freq: Two times a day (BID) | ORAL | 0 refills | Status: AC

## 2017-07-03 NOTE — Progress Notes (Signed)
Cardiology Office Note:    Date:  07/10/2017   ID:  FYNN ADEL, DOB [birth year], MRN [id number]  PCP:  Gayland Curry, DO  Cardiologist:  Dr. [name]   Electrophysiologist:  n/a GI:  Dr. [name]  Referring MD: Gayland Curry, DO   Chief Complaint  Patient presents with  . Follow-up    History of Present Illness:    72 y.o. CRF;s HTN, HLD, DM-2 Had SEMI September 2017 followed by CABG EF 45-50% Surgery Dr Roxan Hockey included LIMA-LAD, SVG- IM, SVG -PDA. Post op developed ascites And cirrhosis . Seen by Dr [name] 03/09/16 paleced on aldactone and has had multiple paracentesis.   She denies syncope, orthopnea, PND, edema. She denies recurrent abdominal distention.   Had new antibiotic for UTI and got chest pain with it. Likely from esophagitis /gastritis   Prior CV and other studies that were reviewed today include:    Echo 03/08/16 Mild concentric LVH, EF 60-65, normal wall motion, grade 1 diastolic dysfunction, very mild aortic stenosis suggested by gradients (mean 12), mild AI, MAC, mild MR, mild to moderate LAE, PASP 38, left pleural effusion  Abdominal CT 03/06/16 IMPRESSION: Cirrhosis.  No focal hepatic lesion is seen. Moderate to large abdominopelvic ascites. Gastroesophageal and perigastric varices.  Portal vein is patent.  Carotid US 9/17 Bilateral ICA 1-39  ABIs 9/17 Normal  LHC 9/17 Acute coronary syndrome with recent total occlusion of the dominant RCA, high-grade obstruction in the proximal LAD which Collateralizes the RCA, and high-grade obstruction and a moderate size ramus intermedius branch. Left ventricular systolic dysfunction with inferobasal moderate hypokinesis and EF of 45-50%. Upper normal LV EDP.   Past Medical History:  Diagnosis Date  . Allergy   . Arthritis    neck  . Cataract    bilateral - MD monitoring cataracts  . CHF (congestive heart failure) ([place])   . Chronic kidney disease, stage I    DR OTTELIN  HX UTIS  .  Cirrhosis ([place])   . Cramp of limb   . Diabetes mellitus   . Dysphagia, unspecified(787.20)   . Dysuria   . Epistaxis   . GERD (gastroesophageal reflux disease)   . Heart murmur    NO CARDIOLOGIST  DX FOR YEARS ASYMPTOMATIC  . Lumbago   . Neoplasm of uncertain behavior of skin   . Nonspecific elevation of levels of transaminase or lactic acid dehydrogenase (LDH)   . Osteoarthrosis, unspecified whether generalized or localized, unspecified site   . Other and unspecified hyperlipidemia    diet controlled  . Pain in joint, shoulder region   . Paresthesias 04/01/2015  . Postablative ovarian failure   . Trochanteric bursitis of left hip 12/15/2015  . Type 2 diabetes mellitus without complication ([place])   . Unspecified essential hypertension    no meds    Past Surgical History:  Procedure Laterality Date  . CARDIAC CATHETERIZATION N/A 01/27/2016   Procedure: Left Heart Cath and Coronary Angiography;  Surgeon: Belva Crome, MD;  Location: [facility];  Service: Cardiovascular;  Laterality: N/A;  . COLONOSCOPY  2012   [facility] - polyp  . COLONOSCOPY WITH PROPOFOL N/A 07/07/2016   Procedure: COLONOSCOPY WITH PROPOFOL;  Surgeon: [name], MD;  [facility];  Service: Endoscopy;  Laterality: N/A;  . CORONARY ARTERY BYPASS GRAFT N/A 01/28/2016   Procedure: CORONARY ARTERY BYPASS GRAFTING (CABG) x 3 USING RIGHT LEG GREATER SAPHENOUS VEIN GRAFT;  Surgeon: Melrose Nakayama, MD;  Location: [facility];  Service: Open Heart Surgery;  Laterality: N/A;  . ENDOVEIN HARVEST OF GREATER SAPHENOUS VEIN Right 01/28/2016   Procedure: ENDOVEIN HARVEST OF GREATER SAPHENOUS VEIN;  Surgeon: Melrose Nakayama, MD;  Location: [facility];  Service: Open Heart Surgery;  Laterality: Right;  . ESOPHAGOGASTRODUODENOSCOPY (EGD) WITH PROPOFOL N/A 07/07/2016   Procedure: ESOPHAGOGASTRODUODENOSCOPY (EGD) WITH PROPOFOL;  Surgeon: [name], MD;  [facility];  Service: Endoscopy;  Laterality: N/A;   . MAXIMUM ACCESS (MAS)POSTERIOR LUMBAR INTERBODY FUSION (PLIF) 1 LEVEL Left 06/10/2015   Procedure: FOR MAXIMUM ACCESS (MAS) POSTERIOR LUMBAR INTERBODY FUSION (PLIF) LUMBAR THREE-FOUR EXTRAFORAMINAL MICRODISCECTOMY LUMBAR FIVE-SACRAL ONE LEFT;  Surgeon: Eustace Moore, MD;  Location: [facility];  Service: Neurosurgery;  Laterality: Left;  . TEE WITHOUT CARDIOVERSION N/A 01/28/2016   Procedure: TRANSESOPHAGEAL ECHOCARDIOGRAM (TEE);  Surgeon: Melrose Nakayama, MD;  Location: [facility];  Service: Open Heart Surgery;  Laterality: N/A;  . TUBAL LIGATION  1982   Dr Connye Burkitt  . UPPER GASTROINTESTINAL ENDOSCOPY    . VAGINAL HYSTERECTOMY  1997   Dr Rande Lawman    Current Medications: Current Meds  Medication Sig  . acetaminophen (TYLENOL) 325 MG tablet Take 2 tablets (650 mg total) by mouth every 6 (six) hours as needed for mild pain.  . Aromatic Inhalants (VICKS VAPOR IN) Vicks Vapor Rub apply small amount to outside of nose to help breathing  . aspirin EC 81 MG tablet Take 1 tablet (81 mg total) by mouth daily.  . Biotin 10000 MCG TABS Take 10,000 mcg by mouth every morning.  . calcium carbonate (OS-CAL) 600 MG TABS Take 600 mg by mouth 2 (two) times daily with a meal.    . cefdinir (OMNICEF) 300 MG capsule Take 1 capsule (300 mg total) by mouth 2 (two) times daily for 7 days.  . cholecalciferol (VITAMIN D) 1000 UNITS tablet Take 2,000 Units by mouth daily.   . Cyanocobalamin (VITAMIN B 12 PO) Take 1,000 mcg by mouth daily.    [name] Kitchen ezetimibe (ZETIA) 10 MG tablet Take 1 tablet (10 mg total) by mouth daily.  . fish oil-omega-3 fatty acids 1000 MG capsule Take 1 g by mouth daily.    . furosemide (LASIX) 40 MG tablet Take 1 tablet (40 mg total) by mouth daily.  [name] Kitchen glucose blood test strip One Touch Ultra II strips. Use to test blood sugar three times daily. Dx: E11.65  . insulin detemir (LEVEMIR) 100 UNIT/ML injection Inject 0.55 mLs (55 Units total) daily into the skin.  [name] Kitchen insulin lispro (HUMALOG KWIKPEN) 100  UNIT/ML KiwkPen Inject 12 units with breakfast, 8 units with lunch, and 12 units with supper  . Insulin Pen Needle 32G X 4 MM MISC 1 each by Does not apply route 3 (three) times daily.  . Insulin Syringe-Needle U-100 (INSULIN SYRINGE .5CC/31GX5/16") 31G X 5/16" 0.5 ML MISC USE DAILY WITH INSULIN  . Insulin Syringe-Needle U-100 (INSULIN SYRINGE 1CC/30GX5/16") 30G X 5/16" 1 ML MISC Use daily with levemir injection DX E11.65  . MAGNESIUM PO Take 500 mg by mouth 2 (two) times daily in the am and at bedtime..  . Multiple Vitamins-Minerals (MULTIVITAMIN WITH MINERALS) tablet Take 1 tablet by mouth daily.    . nadolol (CORGARD) 80 MG tablet TAKE 1 TABLET(80 MG) BY MOUTH DAILY  . omeprazole (PRILOSEC) 20 MG capsule TAKE ONE CAPSULE BY MOUTH DAILY  . Polyethyl Glycol-Propyl Glycol (SYSTANE OP) Place 1 drop into both eyes 2 (two) times daily.  . potassium chloride SA (K-DUR,KLOR-CON) 20 MEQ  tablet TAKE 1 TABLET BY MOUTH EVERY DAY  . Probiotic Product (PROBIOTIC DAILY PO) Take 1 capsule by mouth daily. Digestive Advantage Probiotic  . spironolactone (ALDACTONE) 100 MG tablet Take 1 tablet (100 mg total) by mouth daily.     Allergies:   Kiwi extract; Tdap [diphth-acell pertussis-tetanus]; Statins; Latex; and Tramadol   Social History   Socioeconomic History  . Marital status: Married    Spouse name: None  . Number of children: 6  . Years of education: None  . Highest education level: None  Social Needs  . Financial resource strain: Not hard at all  . Food insecurity - worry: Never true  . Food insecurity - inability: Never true  . Transportation needs - medical: No  . Transportation needs - non-medical: No  Occupational History  . Occupation: retired  Tobacco Use  . Smoking status: Never Smoker  . Smokeless tobacco: Never Used  Substance and Sexual Activity  . Alcohol use: No  . Drug use: No  . Sexual activity: Yes    Partners: Male    Birth control/protection: Post-menopausal, Surgical      Comment: Hysterectomy  Other Topics Concern  . None  Social History Narrative  . None     Family History:  The patient's family history includes Arthritis in her brother and sister; Breast cancer in her maternal aunt and mother; Heart disease in her maternal grandfather, maternal grandmother, paternal grandfather, and paternal grandmother; Liver cancer in her brother; Lung cancer in her father.   ROS:   Please see the history of present illness.    Review of Systems  Constitution: Positive for chills.  Respiratory: Positive for cough.   Neurological: Positive for dizziness.  Psychiatric/Behavioral: The patient is nervous/anxious.    All other systems reviewed and are negative.   EKGs/Labs/Other Test Reviewed:    EKG:   04/05/16 NSR, HR 84, NSSTTW changes, QTc 479, PVC, similar to prior tracings. 01/16/17 SR nonspecific ST/T wave changes   Recent Labs: 04/03/2017: Hemoglobin 13.3; Platelets 129.0 05/25/2017: BUN 19; Creat 0.92; Potassium 4.4; Sodium 137 06/19/2017: ALT 77   Recent Lipid Panel    Component Value Date/Time   CHOL 200 (H) 05/25/2017 0940   CHOL 185 07/08/2016 1045   TRIG 80 05/25/2017 0940   HDL 55 05/25/2017 0940   HDL 55 07/08/2016 1045   CHOLHDL 3.6 05/25/2017 0940   VLDL 13 04/15/2016 1002   LDLCALC 117 (H) 07/08/2016 1045     Physical Exam:    VS:  BP 110/70   Pulse (!) 48   Ht _0  (1.6 m)   Wt 144 lb (65.3 kg)   BMI 25.51 kg/m     Wt Readings from Last 3 Encounters:  07/10/17 144 lb (65.3 kg)  06/19/17 142 lb (64.4 kg)  05/29/17 141 lb (64 kg)    Affect appropriate Healthy:  appears stated age HEENT: normal Neck supple with no adenopathy JVP normal no bruits no thyromegaly Lungs clear with no wheezing and good diaphragmatic motion Heart:  S1/S2 no murmur, no rub, gallop or click PMI normal post sternotomy  Abdomen: benighn, BS positve, no tenderness, no AAA no bruit.  No HSM or HJR Distal pulses intact with no bruits No  edema Neuro non-focal Skin warm and dry No muscular weakness    ASSESSMENT:    1. S/P CABG x 3   2. Coronary artery disease involving coronary bypass graft of native heart without angina pectoris   3. Hyperlipidemia, unspecified hyperlipidemia  type   4. Essential hypertension   5. PVC's (premature ventricular contractions)    PLAN:    In order of problems listed above:  1. CAD -  CABG in 9/17.  No angina continue ASA and beta blocker Chest pain with antibiotic Likely from GI irritation observe   2. Cirrhosis - Question if 2/2 to NASH.  She is followed by GI.  She is now on Lasix and Spironolactone.  She is s/p paracentesis. Beta blocker changed to nadolol for ascites and to prevent variceal bleeding   3. HL -  On zetia not statin due to cirrhosis labs with primary  4. HTN - Well controlled.  Continue current medications and low sodium Dash type diet.    5. PVCs - in setting normal EF post CABG continue corgard   6. Paresthesias - She notes bilateral thigh numbness.  She had lumbar back surgery with Dr. Ronnald Ramp in 2/17.  I have asked her to contact him with her symptoms.    [name], MD

## 2017-07-03 NOTE — Telephone Encounter (Signed)
-----   Message from Gayland Curry, DO sent at 07/03/2017 12:05 PM EST ----- Was not addressed Friday.  Urine culture did grow out E coli bacteria.  It's resistant to cipro.  Let's have her take cefdinir 331m po bid for 7 days for her UTI.  She should eat yogurt daily while taking the antibiotics.

## 2017-07-07 ENCOUNTER — Other Ambulatory Visit: Payer: Self-pay | Admitting: Gastroenterology

## 2017-07-10 ENCOUNTER — Encounter: Payer: Self-pay | Admitting: Cardiovascular Disease

## 2017-07-10 ENCOUNTER — Ambulatory Visit (INDEPENDENT_AMBULATORY_CARE_PROVIDER_SITE_OTHER): Payer: Medicare Other | Admitting: Cardiovascular Disease

## 2017-07-10 VITALS — BP 110/70 | HR 48 | Ht 63.0 in | Wt 144.0 lb

## 2017-07-10 DIAGNOSIS — I10 Essential (primary) hypertension: Secondary | ICD-10-CM

## 2017-07-10 DIAGNOSIS — E785 Hyperlipidemia, unspecified: Secondary | ICD-10-CM

## 2017-07-10 DIAGNOSIS — Z951 Presence of aortocoronary bypass graft: Secondary | ICD-10-CM | POA: Diagnosis not present

## 2017-07-10 DIAGNOSIS — I25810 Atherosclerosis of coronary artery bypass graft(s) without angina pectoris: Secondary | ICD-10-CM

## 2017-07-10 DIAGNOSIS — I493 Ventricular premature depolarization: Secondary | ICD-10-CM

## 2017-07-10 NOTE — Patient Instructions (Addendum)

## 2017-07-19 ENCOUNTER — Other Ambulatory Visit: Payer: Self-pay | Admitting: Pharmacotherapy

## 2017-07-19 DIAGNOSIS — Z794 Long term (current) use of insulin: Secondary | ICD-10-CM

## 2017-07-19 DIAGNOSIS — E1165 Type 2 diabetes mellitus with hyperglycemia: Secondary | ICD-10-CM

## 2017-08-07 ENCOUNTER — Other Ambulatory Visit: Payer: Self-pay | Admitting: Internal Medicine

## 2017-08-07 DIAGNOSIS — Z1231 Encounter for screening mammogram for malignant neoplasm of breast: Secondary | ICD-10-CM

## 2017-08-14 ENCOUNTER — Encounter (INDEPENDENT_AMBULATORY_CARE_PROVIDER_SITE_OTHER): Payer: Self-pay

## 2017-08-14 ENCOUNTER — Ambulatory Visit (INDEPENDENT_AMBULATORY_CARE_PROVIDER_SITE_OTHER): Payer: Medicare Other | Admitting: Gastroenterology

## 2017-08-14 ENCOUNTER — Other Ambulatory Visit: Payer: Self-pay | Admitting: Gastroenterology

## 2017-08-14 ENCOUNTER — Encounter: Payer: Self-pay | Admitting: Gastroenterology

## 2017-08-14 ENCOUNTER — Other Ambulatory Visit (INDEPENDENT_AMBULATORY_CARE_PROVIDER_SITE_OTHER): Payer: Medicare Other

## 2017-08-14 VITALS — BP 106/54 | HR 56 | Ht 62.5 in | Wt 143.5 lb

## 2017-08-14 DIAGNOSIS — I25810 Atherosclerosis of coronary artery bypass graft(s) without angina pectoris: Secondary | ICD-10-CM

## 2017-08-14 DIAGNOSIS — K7469 Other cirrhosis of liver: Secondary | ICD-10-CM

## 2017-08-14 LAB — CBC WITH DIFFERENTIAL/PLATELET
Basophils Absolute: 0.1 10*3/uL (ref 0.0–0.1)
Basophils Relative: 0.8 % (ref 0.0–3.0)
EOS PCT: 2.4 % (ref 0.0–5.0)
Eosinophils Absolute: 0.2 10*3/uL (ref 0.0–0.7)
HCT: 40.5 % (ref 36.0–46.0)
Hemoglobin: 13.4 g/dL (ref 12.0–15.0)
LYMPHS ABS: 2 10*3/uL (ref 0.7–4.0)
Lymphocytes Relative: 29.2 % (ref 12.0–46.0)
MCHC: 33.1 g/dL (ref 30.0–36.0)
MCV: 88.7 fl (ref 78.0–100.0)
MONO ABS: 0.9 10*3/uL (ref 0.1–1.0)
MONOS PCT: 12.8 % — AB (ref 3.0–12.0)
NEUTROS ABS: 3.8 10*3/uL (ref 1.4–7.7)
NEUTROS PCT: 54.8 % (ref 43.0–77.0)
PLATELETS: 134 10*3/uL — AB (ref 150.0–400.0)
RBC: 4.57 Mil/uL (ref 3.87–5.11)
RDW: 14.4 % (ref 11.5–15.5)
WBC: 7 10*3/uL (ref 4.0–10.5)

## 2017-08-14 LAB — COMPREHENSIVE METABOLIC PANEL
ALT: 54 U/L — AB (ref 0–35)
AST: 60 U/L — ABNORMAL HIGH (ref 0–37)
Albumin: 3.5 g/dL (ref 3.5–5.2)
Alkaline Phosphatase: 117 U/L (ref 39–117)
BILIRUBIN TOTAL: 0.8 mg/dL (ref 0.2–1.2)
BUN: 19 mg/dL (ref 6–23)
CO2: 30 meq/L (ref 19–32)
Calcium: 10.1 mg/dL (ref 8.4–10.5)
Chloride: 100 mEq/L (ref 96–112)
Creatinine, Ser: 1.01 mg/dL (ref 0.40–1.20)
GFR: 57.48 mL/min — AB (ref >60.00)
GLUCOSE: 60 mg/dL — AB (ref 70–99)
Potassium: 4 mEq/L (ref 3.5–5.1)
SODIUM: 136 meq/L (ref 135–145)
Total Protein: 7.7 g/dL (ref 6.0–8.3)

## 2017-08-14 LAB — PROTIME-INR
INR: 1.1 ratio — AB (ref 0.8–1.0)
PROTHROMBIN TIME: 12.4 s (ref 9.6–13.1)

## 2017-08-14 NOTE — Patient Instructions (Addendum)
June 2019 AFP and [place] for hepatoma screening You will have labs checked today in the basement lab.  Please head down after you check out with the front desk  (cbc, cmet, inr). Please return to see Dr. [name] in 6 months..Normal BMI (Body Mass Index- based on height and weight) is between 23 and 30. Your BMI today is Body mass index is 25.83 kg/m. [name] Kitchen Please consider follow up  regarding your BMI with your Primary Care Provider.

## 2017-08-14 NOTE — Progress Notes (Signed)
Review of pertinent gastrointestinal problems: 1. Personal history of adenomatous colon polyps. Colonoscopy Dr. [name] 2012 August was normal except for 2 subcentimeter polyps, one was a tubular adenoma. She was recommended to have recall colonoscopy at five-year interval. She had also had previous colonoscopies with Dr. Lajoyce Corners and found to have adenomas as well.Colonoscopy Dr. [name] 07/2016 one subCM TA, recall at 5 years 2. Cirrhosis: likely from Fatty liver  Paracentesis 11/20174 liters, elevated SAAG, no SBP, cytology neg for malignancy  Most recent imaging:03/2017 [place]: cirrhosis, splenomegaly, NEW partial (non-occlusive) PV thrombus, Gallstones and new "mild dilation of intra and extrahepatic bile ducts"; MRI 03/2017 biliary findings probably from extensive periportal fibrosis, small gallstones in GB, non-occlusive PV thrombus appears chronic.  Most recent AFP: 03/2017 normal  Most recent EGD:07/2016 EGD: large varices, + portal gastropathy; started nadolol after discussion with cardiology(nadolol 43m once dialy); no recall necessary since on Nadolol.  Immunization for Hep A/B:Started 06/2016  PV thrombus 2018 imaging, non-occlusive and chronic per MR; hematology workup and I agreed; no plans for anticoag (given large varices)   HPI: This is a  very pleasant 72year old woman whom I last saw about 4 5 months ago.  No gi issues.  No overt bleeding, no encephalopathy.  No issues with fluid overload.  Breathing comfortably.  Chief complaint is cirrhosis  ROS: complete GI ROS as described in HPI, all other review negative.  Constitutional:  No unintentional weight loss (weight up one pound in 5 months , [place].    Past Medical History:  Diagnosis Date  . Allergy   . Arthritis    neck  . Cataract    bilateral - MD monitoring cataracts  . CH[place] ([place] heart [place]) [place]   . Chronic kidney disease, stage I    DR OTTELIN  HX UTIS  .  Cirrhosis ([place]   . Cramp of limb   . Diabetes mellitus   . Dysphagia, unspecified(787.20)   . Dysuria   . Epistaxis   . GERD (gastroesophageal reflux disease)   . Heart murmur    NO CARDIOLOGIST  DX FOR YEARS ASYMPTOMATIC  . Lumbago   . Neoplasm of uncertain behavior of skin   . Nonspecific elevation of levels of transaminase or lactic acid dehydrogenase (LDH)   . Osteoarthrosis, unspecified whether generalized or localized, unspecified site   . Other and unspecified hyperlipidemia    diet controlled  . Pain in joint, shoulder region   . Paresthesias 04/01/2015  . Postablative ovarian failure   . Trochanteric bursitis of left hip 12/15/2015  . Type 2 diabetes mellitus without complication ([place]   . Unspecified essential hypertension    no meds    Past Surgical History:  Procedure Laterality Date  . CARDIAC CATHETERIZATION N/A 01/27/2016   Procedure: Left Heart Cath and Coronary Angiography;  Surgeon: HBelva Crome MD;  [facility];  Service: Cardiovascular;  Laterality: N/A;  . COLONOSCOPY  2012   Lamara Brecht - polyp  . COLONOSCOPY WITH PROPOFOL N/A 07/07/2016   Procedure: COLONOSCOPY WITH PROPOFOL;  Surgeon: DMilus Banister MD;  [facility];  Service: Endoscopy;  Laterality: N/A;  . CORONARY ARTERY BYPASS GRAFT N/A 01/28/2016   Procedure: CORONARY ARTERY BYPASS GRAFTING (CABG) x 3 USING RIGHT LEG GREATER SAPHENOUS VEIN GRAFT;  Surgeon: SMelrose Nakayama MD;  [facility]  Service: Open Heart Surgery;  Laterality: N/A;  . ENDOVEIN HARVEST OF GREATER SAPHENOUS VEIN Right 01/28/2016  Procedure: ENDOVEIN HARVEST OF GREATER SAPHENOUS VEIN;  Surgeon: Melrose Nakayama, MD;  Location: [facility];  Service: Open Heart Surgery;  Laterality: Right;  . ESOPHAGOGASTRODUODENOSCOPY (EGD) WITH PROPOFOL N/A 07/07/2016   Procedure: ESOPHAGOGASTRODUODENOSCOPY (EGD) WITH PROPOFOL;  Surgeon: [name], MD;  [facility];  Service: Endoscopy;  Laterality: N/A;   . MAXIMUM ACCESS (MAS)POSTERIOR LUMBAR INTERBODY FUSION (PLIF) 1 LEVEL Left 06/10/2015   Procedure: FOR MAXIMUM ACCESS (MAS) POSTERIOR LUMBAR INTERBODY FUSION (PLIF) LUMBAR THREE-FOUR EXTRAFORAMINAL MICRODISCECTOMY LUMBAR FIVE-SACRAL ONE LEFT;  Surgeon: Eustace Moore, MD;  Location: [facility];  Service: Neurosurgery;  Laterality: Left;  . TEE WITHOUT CARDIOVERSION N/A 01/28/2016   Procedure: TRANSESOPHAGEAL ECHOCARDIOGRAM (TEE);  Surgeon: Melrose Nakayama, MD;  Location: [facility];  Service: Open Heart Surgery;  Laterality: N/A;  . TUBAL LIGATION  1982   Dr Connye Burkitt  . UPPER GASTROINTESTINAL ENDOSCOPY    . VAGINAL HYSTERECTOMY  1997   Dr Rande Lawman    Current Outpatient Medications  Medication Sig Dispense Refill  . acetaminophen (TYLENOL) 325 MG tablet Take 2 tablets (650 mg total) by mouth every 6 (six) hours as needed for mild pain.    . Aromatic Inhalants (VICKS VAPOR IN) Vicks Vapor Rub apply small amount to outside of nose to help breathing    . aspirin EC 81 MG tablet Take 1 tablet (81 mg total) by mouth daily. 90 tablet 3  . Biotin 10000 MCG TABS Take 10,000 mcg by mouth every morning.    . calcium carbonate (OS-CAL) 600 MG TABS Take 600 mg by mouth 2 (two) times daily with a meal.      . cholecalciferol (VITAMIN D) 1000 UNITS tablet Take 2,000 Units by mouth daily.     . Cyanocobalamin (VITAMIN B 12 PO) Take 1,000 mcg by mouth daily.      [name] Kitchen ezetimibe (ZETIA) 10 MG tablet Take 1 tablet (10 mg total) by mouth daily. 30 tablet 3  . fish oil-omega-3 fatty acids 1000 MG capsule Take 1 g by mouth daily.      . furosemide (LASIX) 40 MG tablet Take 1 tablet (40 mg total) by mouth daily. 90 tablet 2  . glucose blood test strip One Touch Ultra II strips. Use to test blood sugar three times daily. Dx: E11.65 300 each 3  . Insulin Pen Needle 32G X 4 MM MISC 1 each by Does not apply route 3 (three) times daily. 100 each 12  . Insulin Syringe-Needle U-100 (INSULIN SYRINGE .5CC/31GX5/16") 31G X 5/16"  0.5 ML MISC USE DAILY WITH INSULIN 100 each 6  . Insulin Syringe-Needle U-100 (INSULIN SYRINGE 1CC/30GX5/16") 30G X 5/16" 1 ML MISC Use daily with levemir injection DX E11.65 100 each 3  . LEVEMIR 100 UNIT/ML injection INJECT 55 UNITS EVERY DAY UNDER THE SKIN 10 mL 3  . MAGNESIUM PO Take 500 mg by mouth 2 (two) times daily in the am and at bedtime..    . Multiple Vitamins-Minerals (MULTIVITAMIN WITH MINERALS) tablet Take 1 tablet by mouth daily.      . nadolol (CORGARD) 80 MG tablet TAKE 1 TABLET(80 MG) BY MOUTH DAILY 30 tablet 0  . NOVOLOG FLEXPEN 100 UNIT/ML FlexPen Inject into the skin. 12 units in the morning 8 at lunch and 12 at supper  11  . omeprazole (PRILOSEC) 20 MG capsule TAKE ONE CAPSULE BY MOUTH DAILY 90 capsule 1  . Polyethyl Glycol-Propyl Glycol (SYSTANE OP) Place 1 drop into both eyes 2 (two) times daily.    [name] Kitchen  potassium chloride SA (K-DUR,KLOR-CON) 20 MEQ tablet TAKE 1 TABLET BY MOUTH EVERY DAY 90 tablet 0  . Probiotic Product (PROBIOTIC DAILY PO) Take 1 capsule by mouth daily. Digestive Advantage Probiotic    . spironolactone (ALDACTONE) 100 MG tablet Take 1 tablet (100 mg total) by mouth daily. 60 tablet 6   No current facility-administered medications for this visit.     Allergies as of 08/14/2017 - Review Complete 08/14/2017  Allergen Reaction Noted  . Kiwi extract Anaphylaxis 05/28/2015  . Tdap [tetanus-diphth-acell pertussis] Swelling and Other (See Comments) 03/27/2013  . Statins  05/08/2016  . Latex Itching, Dermatitis, and Rash 12/22/2010  . Tramadol Nausea And Vomiting 05/28/2015    Family History  Problem Relation Age of Onset  . Lung cancer Father   . Arthritis Sister   . Arthritis Brother   . Heart disease Maternal Grandmother   . Heart disease Maternal Grandfather   . Heart disease Paternal Grandmother   . Heart disease Paternal Grandfather   . Breast cancer Mother   . Liver cancer Brother   . Breast cancer Maternal Aunt   . Colon cancer Neg Hx   .  Esophageal cancer Neg Hx   . Rectal cancer Neg Hx   . Stomach cancer Neg Hx     Social History   Socioeconomic History  . Marital status: Married    Spouse name: Not on file  . Number of children: 6  . Years of education: Not on file  . Highest education level: Not on file  Occupational History  . Occupation: retired  Scientific laboratory technician  . Financial resource strain: Not hard at all  . Food insecurity:    Worry: Never true    Inability: Never true  . Transportation needs:    Medical: No    Non-medical: No  Tobacco Use  . Smoking status: Never Smoker  . Smokeless tobacco: Never Used  Substance and Sexual Activity  . Alcohol use: No  . Drug use: No  . Sexual activity: Yes    Partners: Male    Birth control/protection: Post-menopausal, Surgical    Comment: Hysterectomy  Lifestyle  . Physical activity:    Days per week: 0 days    Minutes per session: 0 min  . Stress: Not at all  Relationships  . Social connections:    Talks on phone: More than three times a week    Gets together: Twice a week    Attends religious service: More than 4 times per year    Active member of club or organization: No    Attends meetings of clubs or organizations: Never    Relationship status: Married  . Intimate partner violence:    Fear of current or ex partner: No    Emotionally abused: No    Physically abused: No    Forced sexual activity: No  Other Topics Concern  . Not on file  Social History Narrative  . Not on file     Physical Exam: BP (!) 106/54 (BP Location: Left Arm, Patient Position: Sitting, Cuff Size: Normal)   Pulse (!) 56   Ht 5' 2.5" (1.588 m) Comment: height measured without shoes  Wt 143 lb 8 oz (65.1 kg)   BMI 25.83 kg/m  Constitutional: generally well-appearing Psychiatric: alert and oriented x3 Abdomen: soft, nontender, nondistended, no obvious ascites, no peritoneal signs, normal bowel sounds No peripheral edema noted in lower extremities  Assessment and  plan: 72 y.o. female with cirrhosis  She is doing  well currently.  No signs of new decompensation.  She has no edema in her legs.  She has had no overt bleeding.  She has no encephalopathy.  I will restage her liver disease with usual labs including CBC, complete med about profile and coags.  She will be due for hepatoma screening again in June.  She will return to see me in 6 months and sooner if needed.  Please see the "Patient Instructions" section for addition details about the plan.  Owens Loffler, MD [facility] 08/14/2017, 3:50 PM

## 2017-08-23 DIAGNOSIS — H35373 Puckering of macula, bilateral: Secondary | ICD-10-CM | POA: Diagnosis not present

## 2017-08-23 DIAGNOSIS — H2513 Age-related nuclear cataract, bilateral: Secondary | ICD-10-CM | POA: Diagnosis not present

## 2017-08-23 DIAGNOSIS — H02201 Unspecified lagophthalmos right upper eyelid: Secondary | ICD-10-CM | POA: Diagnosis not present

## 2017-08-23 DIAGNOSIS — H02202 Unspecified lagophthalmos right lower eyelid: Secondary | ICD-10-CM | POA: Diagnosis not present

## 2017-08-23 DIAGNOSIS — H02204 Unspecified lagophthalmos left upper eyelid: Secondary | ICD-10-CM | POA: Diagnosis not present

## 2017-08-23 DIAGNOSIS — H524 Presbyopia: Secondary | ICD-10-CM | POA: Diagnosis not present

## 2017-08-23 DIAGNOSIS — H52222 Regular astigmatism, left eye: Secondary | ICD-10-CM | POA: Diagnosis not present

## 2017-08-23 DIAGNOSIS — H5201 Hypermetropia, right eye: Secondary | ICD-10-CM | POA: Diagnosis not present

## 2017-08-23 LAB — HM DIABETES EYE EXAM

## 2017-08-31 DIAGNOSIS — N3020 Other chronic cystitis without hematuria: Secondary | ICD-10-CM | POA: Diagnosis not present

## 2017-08-31 DIAGNOSIS — R350 Frequency of micturition: Secondary | ICD-10-CM | POA: Diagnosis not present

## 2017-09-07 ENCOUNTER — Other Ambulatory Visit: Payer: Self-pay | Admitting: Internal Medicine

## 2017-09-07 ENCOUNTER — Other Ambulatory Visit: Payer: Self-pay | Admitting: Gastroenterology

## 2017-09-07 DIAGNOSIS — E876 Hypokalemia: Secondary | ICD-10-CM

## 2017-09-12 DIAGNOSIS — M542 Cervicalgia: Secondary | ICD-10-CM | POA: Diagnosis not present

## 2017-09-12 DIAGNOSIS — M5416 Radiculopathy, lumbar region: Secondary | ICD-10-CM | POA: Diagnosis not present

## 2017-09-14 ENCOUNTER — Ambulatory Visit
Admission: RE | Admit: 2017-09-14 | Discharge: 2017-09-14 | Disposition: A | Discharge status: Home / Self Care | Payer: Medicare Other | Source: Ambulatory Visit | Attending: Internal Medicine | Admitting: Internal Medicine

## 2017-09-14 DIAGNOSIS — Z1231 Encounter for screening mammogram for malignant neoplasm of breast: Secondary | ICD-10-CM

## 2017-09-15 ENCOUNTER — Encounter: Payer: Self-pay | Admitting: *Deleted

## 2017-09-18 ENCOUNTER — Other Ambulatory Visit: Payer: Self-pay

## 2017-09-18 ENCOUNTER — Encounter: Payer: Self-pay | Admitting: Pharmacotherapy

## 2017-09-18 ENCOUNTER — Ambulatory Visit (INDEPENDENT_AMBULATORY_CARE_PROVIDER_SITE_OTHER): Payer: Medicare Other | Admitting: Pharmacotherapy

## 2017-09-18 VITALS — BP 100/62 | HR 60 | Temp 97.7°F | Resp 18 | Ht 63.0 in | Wt 147.0 lb

## 2017-09-18 DIAGNOSIS — I25810 Atherosclerosis of coronary artery bypass graft(s) without angina pectoris: Secondary | ICD-10-CM

## 2017-09-18 DIAGNOSIS — IMO0001 Reserved for inherently not codable concepts without codable children: Secondary | ICD-10-CM

## 2017-09-18 DIAGNOSIS — Z794 Long term (current) use of insulin: Principal | ICD-10-CM

## 2017-09-18 DIAGNOSIS — I10 Essential (primary) hypertension: Secondary | ICD-10-CM

## 2017-09-18 DIAGNOSIS — E1165 Type 2 diabetes mellitus with hyperglycemia: Secondary | ICD-10-CM | POA: Diagnosis not present

## 2017-09-18 NOTE — Progress Notes (Signed)
  Subjective:    [name] is a 72 y.o.white female who presents for follow-up of Type 2 diabetes mellitus.   Last A1C 12.2% (05/26/17)  Should be taking Levemir 55 units daily and Novolog 12 units breakfast, 8 units lunch, 12 units supper.   Average BG: 147m/dl (all fasting) Denies missed doses of insulin. She is taking her Zetia without problems.  She is making healthy food choices. Exercise is limited due to hip and leg pain. Denies problems with vision.  Has cataracts. Denies problems with feet Some peripheral edema. Nocturia once per night No dysuria Staying well hydrated.  Review of Systems A comprehensive review of systems was negative except for: Eyes: positive for cataracts Cardiovascular: positive for lower extremity edema Genitourinary: positive for nocturia    Objective:    BP 100/62   Pulse 60   Temp 97.7 F (36.5 C) (Oral)   Resp 18   Ht 5' 3"  (1.6 m)   Wt 147 lb (66.7 kg)   SpO2 97%   BMI 26.04 kg/m   General:  alert, cooperative and no distress  Oropharynx: normal findings: lips normal without lesions and gums healthy   Eyes:  negative findings: lids and lashes normal and conjunctivae and sclerae normal   Ears:  external ears normal        Lung: clear to auscultation bilaterally  Heart:  regular rate and rhythm     Extremities: edema in lower extremities.  Skin: warm and dry, no hyperpigmentation, vitiligo, or suspicious lesions     Neuro: mental status, speech normal, alert and oriented x3 and gait and station normal   Lab Review Glucose, Bld (mg/dL)  Date Value  08/14/2017 60 (L)  05/25/2017 161 (H)  04/03/2017 292 (H)   CO2  Date Value  08/14/2017 30 mEq/L  05/25/2017 29 mmol/L  04/03/2017 28 mEq/L   BUN (mg/dL)  Date Value  08/14/2017 19  05/25/2017 19  04/03/2017 15  07/24/2015 11  03/27/2015 15  11/07/2014 15   Creat (mg/dL)  Date Value  05/25/2017 0.92  01/23/2017 0.82  08/19/2016 0.88   Creatinine, Ser  (mg/dL)  Date Value  08/14/2017 1.01  04/03/2017 0.94  09/02/2016 0.85   will check A1C today    Assessment:    Diabetes Mellitus type II, under good control. Based on SMBG data.  Goal A1C is <8% BP at goal <130/80   Plan:    1.  Rx changes: none  2.  Continue Levemir 55 units daily. 3.  Continue Novolog 12 units breakfast, 8 units lunch, 12 units supper. 4.  Counseled on nutrition goals. 5.  Counseled on need for routine exercise.  Goal is 30-45 minutes 5 x week. 6.  BP at goal <130/80.

## 2017-09-18 NOTE — Patient Instructions (Signed)
Continue current insulin doses.  Exercise goal is 30-45 minutes 5 x week  Please make sure she has OV with Dr Mariea Clonts in 3 months for DM follow up.

## 2017-09-19 LAB — COMPLETE METABOLIC PANEL WITH GFR
AG Ratio: 1 (calc) (ref 1.0–2.5)
ALT: 75 U/L — ABNORMAL HIGH (ref 6–29)
AST: 82 U/L — ABNORMAL HIGH (ref 10–35)
Albumin: 3.6 g/dL (ref 3.6–5.1)
Alkaline phosphatase (APISO): 116 U/L (ref 33–130)
BUN/Creatinine Ratio: 17 (calc) (ref 6–22)
BUN: 18 mg/dL (ref 7–25)
CO2: 32 mmol/L (ref 20–32)
Calcium: 10.7 mg/dL — ABNORMAL HIGH (ref 8.6–10.4)
Chloride: 101 mmol/L (ref 98–110)
Creat: 1.04 mg/dL — ABNORMAL HIGH (ref 0.60–0.93)
GFR, Est African American: 63 mL/min/{1.73_m2} (ref >=60)
GFR, Est Non African American: 54 mL/min/{1.73_m2} — ABNORMAL LOW (ref >=60)
Globulin: 3.5 g/dL (calc) (ref 1.9–3.7)
Glucose, Bld: 79 mg/dL (ref 65–99)
Potassium: 4.3 mmol/L (ref 3.5–5.3)
Sodium: 138 mmol/L (ref 135–146)
Total Bilirubin: 0.8 mg/dL (ref 0.2–1.2)
Total Protein: 7.1 g/dL (ref 6.1–8.1)

## 2017-09-19 LAB — HEMOGLOBIN A1C
Hgb A1c MFr Bld: 10 % of total Hgb — ABNORMAL HIGH (ref <5.7)
Mean Plasma Glucose: 240 (calc)
eAG (mmol/L): 13.3 (calc)

## 2017-09-21 ENCOUNTER — Other Ambulatory Visit: Payer: Self-pay | Admitting: Internal Medicine

## 2017-09-21 ENCOUNTER — Other Ambulatory Visit: Payer: Self-pay | Admitting: Gastroenterology

## 2017-09-21 DIAGNOSIS — E785 Hyperlipidemia, unspecified: Principal | ICD-10-CM

## 2017-09-21 DIAGNOSIS — E1169 Type 2 diabetes mellitus with other specified complication: Secondary | ICD-10-CM

## 2017-09-25 ENCOUNTER — Telehealth: Payer: Self-pay | Admitting: Gastroenterology

## 2017-09-25 ENCOUNTER — Encounter: Payer: Self-pay | Admitting: Internal Medicine

## 2017-09-25 ENCOUNTER — Ambulatory Visit (INDEPENDENT_AMBULATORY_CARE_PROVIDER_SITE_OTHER): Payer: Medicare Other | Admitting: Internal Medicine

## 2017-09-25 VITALS — BP 110/60 | HR 54 | Temp 98.0°F | Ht 63.0 in | Wt 146.0 lb

## 2017-09-25 DIAGNOSIS — I2510 Atherosclerotic heart disease of native coronary artery without angina pectoris: Secondary | ICD-10-CM

## 2017-09-25 DIAGNOSIS — E1169 Type 2 diabetes mellitus with other specified complication: Secondary | ICD-10-CM | POA: Diagnosis not present

## 2017-09-25 DIAGNOSIS — E663 Overweight: Secondary | ICD-10-CM | POA: Diagnosis not present

## 2017-09-25 DIAGNOSIS — M542 Cervicalgia: Secondary | ICD-10-CM | POA: Diagnosis not present

## 2017-09-25 DIAGNOSIS — I8510 Secondary esophageal varices without bleeding: Secondary | ICD-10-CM | POA: Diagnosis not present

## 2017-09-25 DIAGNOSIS — K7460 Unspecified cirrhosis of liver: Secondary | ICD-10-CM

## 2017-09-25 DIAGNOSIS — E785 Hyperlipidemia, unspecified: Secondary | ICD-10-CM

## 2017-09-25 DIAGNOSIS — D696 Thrombocytopenia, unspecified: Secondary | ICD-10-CM

## 2017-09-25 DIAGNOSIS — M79605 Pain in left leg: Secondary | ICD-10-CM

## 2017-09-25 DIAGNOSIS — E1165 Type 2 diabetes mellitus with hyperglycemia: Secondary | ICD-10-CM | POA: Diagnosis not present

## 2017-09-25 DIAGNOSIS — I25810 Atherosclerosis of coronary artery bypass graft(s) without angina pectoris: Secondary | ICD-10-CM | POA: Diagnosis not present

## 2017-09-25 NOTE — Telephone Encounter (Signed)
Pt needs clarification on dose for aldactone. She stated to have been taking it one daily but new rf indicates one BID.

## 2017-09-25 NOTE — Progress Notes (Signed)
Location:  [facility] clinic Provider:  Katelan Hirt L. Mariea Clonts, D.O., C.M.D.  Code Status: full code Goals of Care:  Advanced Directives 09/25/2017  Does Patient Have a Medical Advance Directive? No  Does patient want to make changes to medical advance directive? -  Would patient like information on creating a medical advance directive? Yes (MAU/Ambulatory/Procedural Areas - Information given)  ACP packet given more than once, not yet done.  Chief Complaint  Patient presents with  . Medical Management of Chronic Issues    [facility] follow-up    HPI: Patient is a 72y.o. female seen today for medical management of chronic diseases.    Since I saw her in Jan, she saw CTivis Ringerwho increased her humalog to 12 units at breakfast, 8 units at lunch and 12 units at supper.  She was continued on levemir 55 units daily.  hba1c as 10 on 09/19/17.  [place] older [place] and how it's adjusted.  Says she is taking her insulin.  She did not bring her meter today.  It was 65 this morning.  She's had some other lows.  She eats early in the evening and not again until breakfast.  Her torso area will itch when she's low.    She had a UTI that I treated initially with cipro but it was resistant and I changed it to cefdinir for a week.  All resolved.    She saw Dr. NJohnsie Cancelfrom cardiology.  Her chest pain was felt to be due to irritation of her stomach from the abx.  Her cirrhosis is felt to be due to NASH.  Beta blocker was changed to nadolol for ascites and to prevent variceal bleeding.  She reported that her latest aldactone Rx was for bid and she'd been on it daily.    Hyperlipidemia:  On zetia instead of statins to decrease effect on liver.  Did not recheck cholesterol.    She was advised by Dr. NJohnsie Cancelalso to f/u with Dr. JRonnald Rampwho did her lumbar spinal surgery in 2/17.  Has an MRI of her neck and her hip b/c she did see Dr. JRonnald Ramp PA.  She is unable to walk properly as pain goes all the way  down the left leg now which is no way to live or function.  Her neck hurts when she turns.  Has arthritis in it.    She just had an eye exam.  Has to get a change in her glasses, but cataract is not yet ready for excision.  No retinopathy noted.  Dr. DBing Plumeis her ophthalmologist.    Refuses shingles shot.  Past Medical History:  Diagnosis Date  . Allergy   . Arthritis    neck  . Cataract    bilateral - MD monitoring cataracts  . CH[place] (congestive heart failure) ([place]   . Chronic kidney disease, stage I    DR OTTELIN  HX UTIS  . Cirrhosis ([place]   . Cramp of limb   . Diabetes mellitus   . Dysphagia, unspecified(787.20)   . Dysuria   . Epistaxis   . GERD (gastroesophageal reflux disease)   . Heart murmur    NO CARDIOLOGIST  DX FOR YEARS ASYMPTOMATIC  . Lumbago   . Neoplasm of uncertain behavior of skin   . Nonspecific elevation of levels of transaminase or lactic acid dehydrogenase (LDH)   . Osteoarthrosis, unspecified whether generalized or localized, unspecified site   . Other and unspecified hyperlipidemia  diet controlled  . Pain in joint, shoulder region   . Paresthesias 04/01/2015  . Postablative ovarian failure   . Trochanteric bursitis of left hip 12/15/2015  . Type 2 diabetes mellitus without complication ([place])   . Unspecified essential hypertension    no meds    Past Surgical History:  Procedure Laterality Date  . BREAST BIOPSY    . CARDIAC CATHETERIZATION N/A 01/27/2016   Procedure: Left Heart Cath and Coronary Angiography;  Surgeon: Belva Crome, MD;  Location: [facility];  Service: Cardiovascular;  Laterality: N/A;  . COLONOSCOPY  2012   [facility] - polyp  . COLONOSCOPY WITH PROPOFOL N/A 07/07/2016   Procedure: COLONOSCOPY WITH PROPOFOL;  Surgeon: [name], MD;  Location: [facility];  Service: Endoscopy;  Laterality: N/A;  . CORONARY ARTERY BYPASS GRAFT N/A 01/28/2016   Procedure: CORONARY ARTERY BYPASS GRAFTING (CABG) x 3 USING RIGHT LEG GREATER  SAPHENOUS VEIN GRAFT;  Surgeon: Melrose Nakayama, MD;  Location: [place];  Service: Open Heart Surgery;  Laterality: N/A;  . ENDOVEIN HARVEST OF GREATER SAPHENOUS VEIN Right 01/28/2016   Procedure: ENDOVEIN HARVEST OF GREATER SAPHENOUS VEIN;  Surgeon: Melrose Nakayama, MD;  Location: [place];  Service: Open Heart Surgery;  Laterality: Right;  . ESOPHAGOGASTRODUODENOSCOPY (EGD) WITH PROPOFOL N/A 07/07/2016   Procedure: ESOPHAGOGASTRODUODENOSCOPY (EGD) WITH PROPOFOL;  Surgeon: [name], MD;  Location: [facility];  Service: Endoscopy;  Laterality: N/A;  . MAXIMUM ACCESS (MAS)POSTERIOR LUMBAR INTERBODY FUSION (PLIF) 1 LEVEL Left 06/10/2015   Procedure: FOR MAXIMUM ACCESS (MAS) POSTERIOR LUMBAR INTERBODY FUSION (PLIF) LUMBAR THREE-FOUR EXTRAFORAMINAL MICRODISCECTOMY LUMBAR FIVE-SACRAL ONE LEFT;  Surgeon: Eustace Moore, MD;  Location: [facility];  Service: Neurosurgery;  Laterality: Left;  . TEE WITHOUT CARDIOVERSION N/A 01/28/2016   Procedure: TRANSESOPHAGEAL ECHOCARDIOGRAM (TEE);  Surgeon: Melrose Nakayama, MD;  Location: [place] [facility];  Service: Open Heart Surgery;  Laterality: N/A;  . TUBAL LIGATION  1982   Dr Connye Burkitt  . UPPER GASTROINTESTINAL ENDOSCOPY    . VAGINAL HYSTERECTOMY  1997   Dr Rande Lawman    Allergies  Allergen Reactions  . Kiwi Extract Anaphylaxis  . Tdap [Tetanus-Diphth-Acell Pertussis] Swelling and Other (See Comments)    Swelling at injection site, gets very hot  . Statins     RHABDOMYOLYSIS  . Latex Itching, Dermatitis and Rash  . Tramadol Nausea And Vomiting    Outpatient Encounter Medications as of 09/25/2017  Medication Sig  . acetaminophen (TYLENOL) 500 MG tablet Take 500 mg by mouth at bedtime as needed for mild pain.  . Aromatic Inhalants (VICKS VAPOR IN) Vicks Vapor Rub apply small amount to outside of nose to help breathing  . aspirin EC 81 MG tablet Take 1 tablet (81 mg total) by mouth daily.  . Biotin 10000 MCG TABS Take 10,000 mcg by mouth every morning.  .  calcium carbonate (OS-CAL) 600 MG TABS Take 600 mg by mouth 2 (two) times daily with a meal.    . cetirizine (ZYRTEC) 10 MG tablet Take 10 mg by mouth daily.  . cholecalciferol (VITAMIN D) 1000 UNITS tablet Take 2,000 Units by mouth daily.   . Cyanocobalamin (VITAMIN B 12 PO) Take 1,000 mcg by mouth daily.    [name] Kitchen ezetimibe (ZETIA) 10 MG tablet TAKE 1 TABLET(10 MG) BY MOUTH DAILY  . fish oil-omega-3 fatty acids 1000 MG capsule Take 1 g by mouth daily.    . furosemide (LASIX) 40 MG tablet Take 1 tablet (40 mg total) by  mouth daily.  [name] Kitchen glucose blood test strip One Touch Ultra II strips. Use to test blood sugar three times daily. Dx: E11.65  . Insulin Syringe-Needle U-100 (INSULIN SYRINGE 1CC/30GX5/16") 30G X 5/16" 1 ML MISC Use daily with levemir injection DX E11.65  . LEVEMIR 100 UNIT/ML injection INJECT 55 UNITS EVERY DAY UNDER THE SKIN  . MAGNESIUM PO Take 500 mg by mouth 2 (two) times daily in the am and at bedtime..  . Multiple Vitamins-Minerals (MULTIVITAMIN WITH MINERALS) tablet Take 1 tablet by mouth daily.    . nadolol (CORGARD) 80 MG tablet TAKE 1 TABLET(80 MG) BY MOUTH DAILY  . NOVOLOG FLEXPEN 100 UNIT/ML FlexPen Inject into the skin. 12 units in the morning 8 at lunch and 12 at supper  . omeprazole (PRILOSEC) 20 MG capsule TAKE ONE CAPSULE BY MOUTH DAILY  . Polyethyl Glycol-Propyl Glycol (SYSTANE OP) Place 1 drop into both eyes 2 (two) times daily.  . potassium chloride SA (K-DUR,KLOR-CON) 20 MEQ tablet TAKE 1 TABLET BY MOUTH EVERY DAY  . Probiotic Product (PROBIOTIC DAILY PO) Take 1 capsule by mouth daily. Digestive Advantage Probiotic  . spironolactone (ALDACTONE) 100 MG tablet TAKE 1 TABLET(100 MG) BY MOUTH TWICE DAILY  . [DISCONTINUED] Insulin Pen Needle 32G X 4 MM MISC 1 each by Does not apply route 3 (three) times daily.  . [DISCONTINUED] Insulin Syringe-Needle U-100 (INSULIN SYRINGE .5CC/31GX5/16") 31G X 5/16" 0.5 ML MISC USE DAILY WITH INSULIN  . [DISCONTINUED] spironolactone  (ALDACTONE) 100 MG tablet Take 1 tablet (100 mg total) by mouth daily.   No facility-administered encounter medications on file as of 09/25/2017.     Review of Systems:  Review of Systems  Constitutional: Positive for malaise/fatigue. Negative for chills and fever.  HENT: Positive for hearing loss.        Hearing aids  Eyes: Negative for blurred vision.  Respiratory: Negative for cough and shortness of breath.   Cardiovascular: Negative for chest pain and palpitations.  Gastrointestinal: Negative for abdominal pain, blood in stool, constipation and melena.       Mild ascites  Genitourinary: Negative for dysuria, frequency and urgency.       Uti resolved  Musculoskeletal: Positive for back pain, joint pain and neck pain. Negative for falls and myalgias.  Neurological: Negative for dizziness and loss of consciousness.  Endo/Heme/Allergies: Bruises/bleeds easily.  Psychiatric/Behavioral: Negative for depression and memory loss. The patient is not nervous/anxious.     Health Maintenance  Topic Date Due  . URINE MICROALBUMIN  06/16/2017  . OPHTHALMOLOGY EXAM  08/04/2017  . INFLUENZA VACCINE  12/07/2017  . HEMOGLOBIN A1C  03/21/2018  . FOOT EXAM  05/29/2018  . MAMMOGRAM  09/15/2019  . COLONOSCOPY  07/07/2021  . DEXA SCAN  Completed  . Hepatitis C Screening  Completed  . PNA vac Low Risk Adult  Completed  . TETANUS/TDAP  Discontinued    Physical Exam: Vitals:   09/25/17 1259  BP: 110/60  Pulse: (!) 54  Temp: 98 F (36.7 C)  TempSrc: Oral  SpO2: 96%  Weight: 146 lb (66.2 kg)  Height: 5' 3"  (1.6 m)   Body mass index is 25.86 kg/m. Physical Exam  Constitutional: She is oriented to person, place, and time. No distress.  Cardiovascular: Normal rate, regular rhythm, normal heart sounds and intact distal pulses.  Pulmonary/Chest: Effort normal and breath sounds normal. No respiratory distress.  Abdominal: Soft. Bowel sounds are normal. She exhibits distension. There is no  tenderness.  Musculoskeletal: Normal range of motion.  Neurological: She is alert and oriented to person, place, and time.  Skin: Skin is warm and dry.  Yellow-gray tone to skin--chronic  Psychiatric: She has a normal mood and affect.    Labs reviewed: Basic Metabolic Panel: Recent Labs    05/25/17 0940 08/14/17 1616 09/18/17 1142  NA 137 136 138  K 4.4 4.0 4.3  CL 102 100 101  CO2 29 30 32  GLUCOSE 161* 60* 79  BUN 19 19 18   CREATININE 0.92 1.01 1.04*  CALCIUM 9.5 10.1 10.7*   Liver Function Tests: Recent Labs    04/03/17 1601  06/19/17 1228 08/14/17 1616 09/18/17 1142  AST 43*   < > 78* 60* 82*  ALT 40*   < > 77* 54* 75*  ALKPHOS 106  --   --  117  --   BILITOT 0.8   < > 0.7 0.8 0.8  PROT 7.7   < > 7.6 7.7 7.1  ALBUMIN 3.5  --   --  3.5  --    < > = values in this interval not displayed.   No results for input(s): LIPASE, AMYLASE in the last 8760 hours. No results for input(s): AMMONIA in the last 8760 hours. CBC: Recent Labs    04/03/17 1601 08/14/17 1616  WBC 5.6 7.0  NEUTROABS 3.2 3.8  HGB 13.3 13.4  HCT 40.3 40.5  MCV 92.1 88.7  PLT 129.0* 134.0*   Lipid Panel: Recent Labs    01/23/17 0923 05/25/17 0940  CHOL 195 200*  HDL 54 55  LDLCALC 123* 128*  TRIG 79 80  CHOLHDL 3.6 3.6   Lab Results  Component Value Date   HGBA1C 10.0 (H) 09/18/2017    Procedures since last visit: Mm Screening Breast Tomo Bilateral  Result Date: 09/14/2017 CLINICAL DATA:  Screening. EXAM: DIGITAL SCREENING BILATERAL MAMMOGRAM WITH TOMO AND CAD COMPARISON:  Previous exam(s). ACR Breast Density Category b: There are scattered areas of fibroglandular density. FINDINGS: There are no findings suspicious for malignancy. Images were processed with CAD. IMPRESSION: No mammographic evidence of malignancy. A result letter of this screening mammogram will be mailed directly to the patient. RECOMMENDATION: Screening mammogram in one year. (Code:SM-B-01Y) BI-RADS CATEGORY  1:  Negative. Electronically Signed   By: Evangeline Dakin M.D.   On: 09/14/2017 13:38    Assessment/Plan 1. uncontrolled type 2 diabetes mellitus with hyperglycemia, with long-term current use of insulin (HCC) - hba1c down to 10, but has some lows now b/c she eats early in the evening, suggested evening snack to prevent this, did not bring meter--advised to bring next time - Basic metabolic panel; Future - Lipid panel; Future - Hemoglobin A1c; Future  2. Cirrhosis of liver without ascites, unspecified hepatic cirrhosis type ([place]) -felt to be due to fatty liver--being managed by Dr. [name] -cont nadolol and aldactone--she's going to clarify with him about bid or daily dose of aldactone   3. Coronary artery disease involving native coronary artery of native heart without angina pectoris -cont zetia for cholesterol, bp well controlled with current regimen - Lipid panel; Future  4. Hyperlipidemia associated with type 2 diabetes mellitus ([place]) -cont zetia therapy - Lipid panel; Future  5. Left leg pain -affecting qol, has MRI from Dr. Ronnald Ramp' PA to evaluate this and #6  6. Neck pain -worse lately also, see #5  7. Overweight with body mass index (BMI) 25.0-29.9 -noted, some due to fatty liver with some ascites  8. Secondary esophageal varices without bleeding (HCC) -cont nadolol  and aldactone to prevent bleeding  9. Thrombocytopenia ([place]) -not in worrisome range, due to cirrhosis Lab Results  Component Value Date   PLT 134.0 (L) 08/14/2017    Labs/tests ordered:   Orders Placed This Encounter  Procedures  . Basic metabolic panel    Standing Status:   Future    Standing Expiration Date:   05/28/2018    Order Specific Question:   Has the patient fasted?    Answer:   Yes  . Lipid panel    Standing Status:   Future    Standing Expiration Date:   05/28/2018    Order Specific Question:   Has the patient fasted?    Answer:   Yes  . Hemoglobin A1c    Standing Status:   Future     Standing Expiration Date:   05/28/2018    Next appt:  4 mos with labs before  Londen Bok L. Niya Behler, D.O. [facility] [address], [place] [address] Cell Phone (Mon-Fri 8am-5pm):  [phone] On Call:  [phone] & follow prompts after 5pm & weekends Office Phone:  [phone] Office Fax:  [phone]

## 2017-09-26 NOTE — Telephone Encounter (Signed)
She should continue taking her usual dose, 146m aldactone once daily.  OK to prescribe 1 month with 11 refills.  Not sure why the pharmacy feels it's been bid.  Thanks

## 2017-09-27 ENCOUNTER — Other Ambulatory Visit: Payer: Self-pay | Admitting: Gastroenterology

## 2017-09-27 DIAGNOSIS — C220 Liver cell carcinoma: Secondary | ICD-10-CM

## 2017-09-27 DIAGNOSIS — M5126 Other intervertebral disc displacement, lumbar region: Secondary | ICD-10-CM | POA: Diagnosis not present

## 2017-09-27 DIAGNOSIS — M4802 Spinal stenosis, cervical region: Secondary | ICD-10-CM | POA: Diagnosis not present

## 2017-09-27 DIAGNOSIS — M50223 Other cervical disc displacement at C6-C7 level: Secondary | ICD-10-CM | POA: Diagnosis not present

## 2017-09-27 DIAGNOSIS — M5416 Radiculopathy, lumbar region: Secondary | ICD-10-CM | POA: Diagnosis not present

## 2017-09-27 DIAGNOSIS — M542 Cervicalgia: Secondary | ICD-10-CM | POA: Diagnosis not present

## 2017-09-27 DIAGNOSIS — K7460 Unspecified cirrhosis of liver: Secondary | ICD-10-CM

## 2017-09-27 DIAGNOSIS — M48061 Spinal stenosis, lumbar region without neurogenic claudication: Secondary | ICD-10-CM | POA: Diagnosis not present

## 2017-09-27 NOTE — Progress Notes (Signed)
An appointment has been made for patient to have abdominal US for Hepatoma screening 10/11/17 at 9:30am at [facility] and blood work (AFP) in our office in June. Patient has notified of the date and time for both appointments. She understands to be NPO 6 hours prior to [place].

## 2017-09-27 NOTE — Telephone Encounter (Signed)
I spoke to patient and she was advised to take Aldactone 172m daily. Patient voiced understanding and stated she has plenty of refills.

## 2017-09-28 ENCOUNTER — Other Ambulatory Visit: Payer: Medicare Other

## 2017-09-28 ENCOUNTER — Other Ambulatory Visit: Payer: Self-pay

## 2017-09-28 DIAGNOSIS — K7460 Unspecified cirrhosis of liver: Secondary | ICD-10-CM

## 2017-09-29 LAB — AFP TUMOR MARKER: AFP TUMOR MARKER: 2.1 ng/mL

## 2017-10-09 DIAGNOSIS — M5416 Radiculopathy, lumbar region: Secondary | ICD-10-CM | POA: Diagnosis not present

## 2017-10-09 DIAGNOSIS — M542 Cervicalgia: Secondary | ICD-10-CM | POA: Diagnosis not present

## 2017-10-11 ENCOUNTER — Ambulatory Visit (HOSPITAL_COMMUNITY)
Admission: RE | Admit: 2017-10-11 | Discharge: 2017-10-11 | Disposition: A | Discharge status: Home / Self Care | Payer: Medicare Other | Source: Ambulatory Visit | Attending: Gastroenterology | Admitting: Gastroenterology

## 2017-10-11 DIAGNOSIS — R161 Splenomegaly, not elsewhere classified: Secondary | ICD-10-CM | POA: Diagnosis not present

## 2017-10-11 DIAGNOSIS — K7460 Unspecified cirrhosis of liver: Secondary | ICD-10-CM | POA: Diagnosis not present

## 2017-10-11 DIAGNOSIS — C220 Liver cell carcinoma: Secondary | ICD-10-CM | POA: Insufficient documentation

## 2017-10-11 DIAGNOSIS — K8020 Calculus of gallbladder without cholecystitis without obstruction: Secondary | ICD-10-CM | POA: Diagnosis not present

## 2017-10-13 ENCOUNTER — Telehealth: Payer: Self-pay | Admitting: Gastroenterology

## 2017-10-13 ENCOUNTER — Other Ambulatory Visit: Payer: Self-pay | Admitting: *Deleted

## 2017-10-13 DIAGNOSIS — M5412 Radiculopathy, cervical region: Secondary | ICD-10-CM | POA: Diagnosis not present

## 2017-10-13 DIAGNOSIS — M542 Cervicalgia: Secondary | ICD-10-CM | POA: Diagnosis not present

## 2017-10-13 DIAGNOSIS — M2560 Stiffness of unspecified joint, not elsewhere classified: Secondary | ICD-10-CM | POA: Diagnosis not present

## 2017-10-13 DIAGNOSIS — R293 Abnormal posture: Secondary | ICD-10-CM | POA: Diagnosis not present

## 2017-10-13 MED ORDER — NOVOLOG FLEXPEN 100 UNIT/ML ~~LOC~~ SOPN: PEN_INJECTOR | SUBCUTANEOUS | 11 refills | Status: DC

## 2017-10-13 NOTE — Telephone Encounter (Signed)
Poke to patient to inform her of [place] results. She voiced understanding and will follow up in October.

## 2017-10-13 NOTE — Telephone Encounter (Signed)
Patient requested refill. Faxed to pharmacy.

## 2017-10-13 NOTE — Progress Notes (Signed)
Left message on voice mail to call office for [place] results

## 2017-10-13 NOTE — Telephone Encounter (Signed)
-----   Message from [name], MD sent at 10/12/2017  4:18 AM EDT -----   Please call the patient.  US shows no sign of liver tumors.  ROV in October 2019.  thanks

## 2017-10-19 ENCOUNTER — Other Ambulatory Visit: Payer: Self-pay | Admitting: Gastroenterology

## 2017-10-19 DIAGNOSIS — R293 Abnormal posture: Secondary | ICD-10-CM | POA: Diagnosis not present

## 2017-10-19 DIAGNOSIS — M542 Cervicalgia: Secondary | ICD-10-CM | POA: Diagnosis not present

## 2017-10-19 DIAGNOSIS — M2560 Stiffness of unspecified joint, not elsewhere classified: Secondary | ICD-10-CM | POA: Diagnosis not present

## 2017-10-19 DIAGNOSIS — M5412 Radiculopathy, cervical region: Secondary | ICD-10-CM | POA: Diagnosis not present

## 2017-10-20 DIAGNOSIS — M5412 Radiculopathy, cervical region: Secondary | ICD-10-CM | POA: Diagnosis not present

## 2017-10-20 DIAGNOSIS — M542 Cervicalgia: Secondary | ICD-10-CM | POA: Diagnosis not present

## 2017-10-20 DIAGNOSIS — R293 Abnormal posture: Secondary | ICD-10-CM | POA: Diagnosis not present

## 2017-10-20 DIAGNOSIS — M2560 Stiffness of unspecified joint, not elsewhere classified: Secondary | ICD-10-CM | POA: Diagnosis not present

## 2017-10-26 DIAGNOSIS — M542 Cervicalgia: Secondary | ICD-10-CM | POA: Diagnosis not present

## 2017-10-26 DIAGNOSIS — R293 Abnormal posture: Secondary | ICD-10-CM | POA: Diagnosis not present

## 2017-10-26 DIAGNOSIS — M2560 Stiffness of unspecified joint, not elsewhere classified: Secondary | ICD-10-CM | POA: Diagnosis not present

## 2017-10-26 DIAGNOSIS — M5412 Radiculopathy, cervical region: Secondary | ICD-10-CM | POA: Diagnosis not present

## 2017-10-30 DIAGNOSIS — M5412 Radiculopathy, cervical region: Secondary | ICD-10-CM | POA: Diagnosis not present

## 2017-10-30 DIAGNOSIS — M2560 Stiffness of unspecified joint, not elsewhere classified: Secondary | ICD-10-CM | POA: Diagnosis not present

## 2017-10-30 DIAGNOSIS — M542 Cervicalgia: Secondary | ICD-10-CM | POA: Diagnosis not present

## 2017-10-30 DIAGNOSIS — R293 Abnormal posture: Secondary | ICD-10-CM | POA: Diagnosis not present

## 2017-11-01 DIAGNOSIS — M542 Cervicalgia: Secondary | ICD-10-CM | POA: Diagnosis not present

## 2017-11-01 DIAGNOSIS — M5412 Radiculopathy, cervical region: Secondary | ICD-10-CM | POA: Diagnosis not present

## 2017-11-01 DIAGNOSIS — M2560 Stiffness of unspecified joint, not elsewhere classified: Secondary | ICD-10-CM | POA: Diagnosis not present

## 2017-11-01 DIAGNOSIS — R293 Abnormal posture: Secondary | ICD-10-CM | POA: Diagnosis not present

## 2017-11-02 DIAGNOSIS — E119 Type 2 diabetes mellitus without complications: Secondary | ICD-10-CM | POA: Diagnosis not present

## 2017-11-02 DIAGNOSIS — M542 Cervicalgia: Secondary | ICD-10-CM | POA: Diagnosis not present

## 2017-11-02 DIAGNOSIS — M961 Postlaminectomy syndrome, not elsewhere classified: Secondary | ICD-10-CM | POA: Diagnosis not present

## 2017-11-02 DIAGNOSIS — M9981 Other biomechanical lesions of cervical region: Secondary | ICD-10-CM | POA: Diagnosis not present

## 2017-11-02 DIAGNOSIS — M5416 Radiculopathy, lumbar region: Secondary | ICD-10-CM | POA: Diagnosis not present

## 2017-11-02 DIAGNOSIS — M48062 Spinal stenosis, lumbar region with neurogenic claudication: Secondary | ICD-10-CM | POA: Diagnosis not present

## 2017-11-06 DIAGNOSIS — M542 Cervicalgia: Secondary | ICD-10-CM | POA: Diagnosis not present

## 2017-11-06 DIAGNOSIS — R293 Abnormal posture: Secondary | ICD-10-CM | POA: Diagnosis not present

## 2017-11-06 DIAGNOSIS — M5412 Radiculopathy, cervical region: Secondary | ICD-10-CM | POA: Diagnosis not present

## 2017-11-06 DIAGNOSIS — M2560 Stiffness of unspecified joint, not elsewhere classified: Secondary | ICD-10-CM | POA: Diagnosis not present

## 2017-11-07 DIAGNOSIS — R293 Abnormal posture: Secondary | ICD-10-CM | POA: Diagnosis not present

## 2017-11-07 DIAGNOSIS — M2560 Stiffness of unspecified joint, not elsewhere classified: Secondary | ICD-10-CM | POA: Diagnosis not present

## 2017-11-07 DIAGNOSIS — M5412 Radiculopathy, cervical region: Secondary | ICD-10-CM | POA: Diagnosis not present

## 2017-11-07 DIAGNOSIS — M542 Cervicalgia: Secondary | ICD-10-CM | POA: Diagnosis not present

## 2017-11-08 DIAGNOSIS — M9981 Other biomechanical lesions of cervical region: Secondary | ICD-10-CM | POA: Diagnosis not present

## 2017-11-08 DIAGNOSIS — M48062 Spinal stenosis, lumbar region with neurogenic claudication: Secondary | ICD-10-CM | POA: Diagnosis not present

## 2017-11-16 ENCOUNTER — Other Ambulatory Visit: Payer: Self-pay | Admitting: Gastroenterology

## 2017-11-16 DIAGNOSIS — M542 Cervicalgia: Secondary | ICD-10-CM | POA: Diagnosis not present

## 2017-11-16 DIAGNOSIS — M5412 Radiculopathy, cervical region: Secondary | ICD-10-CM | POA: Diagnosis not present

## 2017-11-16 DIAGNOSIS — M2560 Stiffness of unspecified joint, not elsewhere classified: Secondary | ICD-10-CM | POA: Diagnosis not present

## 2017-11-16 DIAGNOSIS — R293 Abnormal posture: Secondary | ICD-10-CM | POA: Diagnosis not present

## 2017-11-27 ENCOUNTER — Other Ambulatory Visit: Payer: Self-pay | Admitting: Internal Medicine

## 2017-11-27 DIAGNOSIS — Z794 Long term (current) use of insulin: Secondary | ICD-10-CM

## 2017-11-27 DIAGNOSIS — E1165 Type 2 diabetes mellitus with hyperglycemia: Secondary | ICD-10-CM

## 2017-12-07 ENCOUNTER — Other Ambulatory Visit: Payer: Self-pay | Admitting: Gastroenterology

## 2017-12-07 ENCOUNTER — Other Ambulatory Visit: Payer: Self-pay | Admitting: Internal Medicine

## 2017-12-07 DIAGNOSIS — E876 Hypokalemia: Secondary | ICD-10-CM

## 2017-12-20 ENCOUNTER — Telehealth: Payer: Self-pay

## 2017-12-20 DIAGNOSIS — H9190 Unspecified hearing loss, unspecified ear: Secondary | ICD-10-CM

## 2017-12-20 NOTE — Telephone Encounter (Signed)
Patient called to request a referral to [facility] for another hearing test. She stated that she has gone to [facility] in the past but she needs a new referral.   Will patient need to be seen in office before a referral can be placed.

## 2017-12-20 NOTE — Telephone Encounter (Signed)
No, ok to place referral to [facility] hearing.

## 2017-12-20 NOTE — Telephone Encounter (Signed)
Referral was placed. Patient has been notified.

## 2017-12-28 ENCOUNTER — Other Ambulatory Visit: Payer: Self-pay | Admitting: Internal Medicine

## 2017-12-28 DIAGNOSIS — E785 Hyperlipidemia, unspecified: Principal | ICD-10-CM

## 2017-12-28 DIAGNOSIS — E1169 Type 2 diabetes mellitus with other specified complication: Secondary | ICD-10-CM

## 2017-12-31 ENCOUNTER — Other Ambulatory Visit: Payer: Self-pay | Admitting: Internal Medicine

## 2017-12-31 DIAGNOSIS — Z794 Long term (current) use of insulin: Secondary | ICD-10-CM

## 2017-12-31 DIAGNOSIS — E1165 Type 2 diabetes mellitus with hyperglycemia: Secondary | ICD-10-CM

## 2018-01-03 ENCOUNTER — Encounter: Payer: Self-pay | Admitting: Gastroenterology

## 2018-01-05 DIAGNOSIS — H903 Sensorineural hearing loss, bilateral: Secondary | ICD-10-CM | POA: Diagnosis not present

## 2018-01-08 IMAGING — CR DG CHEST 2V
2 series · 2 of 2 positions shown · non-contrast
Comparison: Radiograph January 31, 2016.

CLINICAL DATA: Shortness of breath.

EXAM:
CHEST  2 VIEW

[w chest pa]
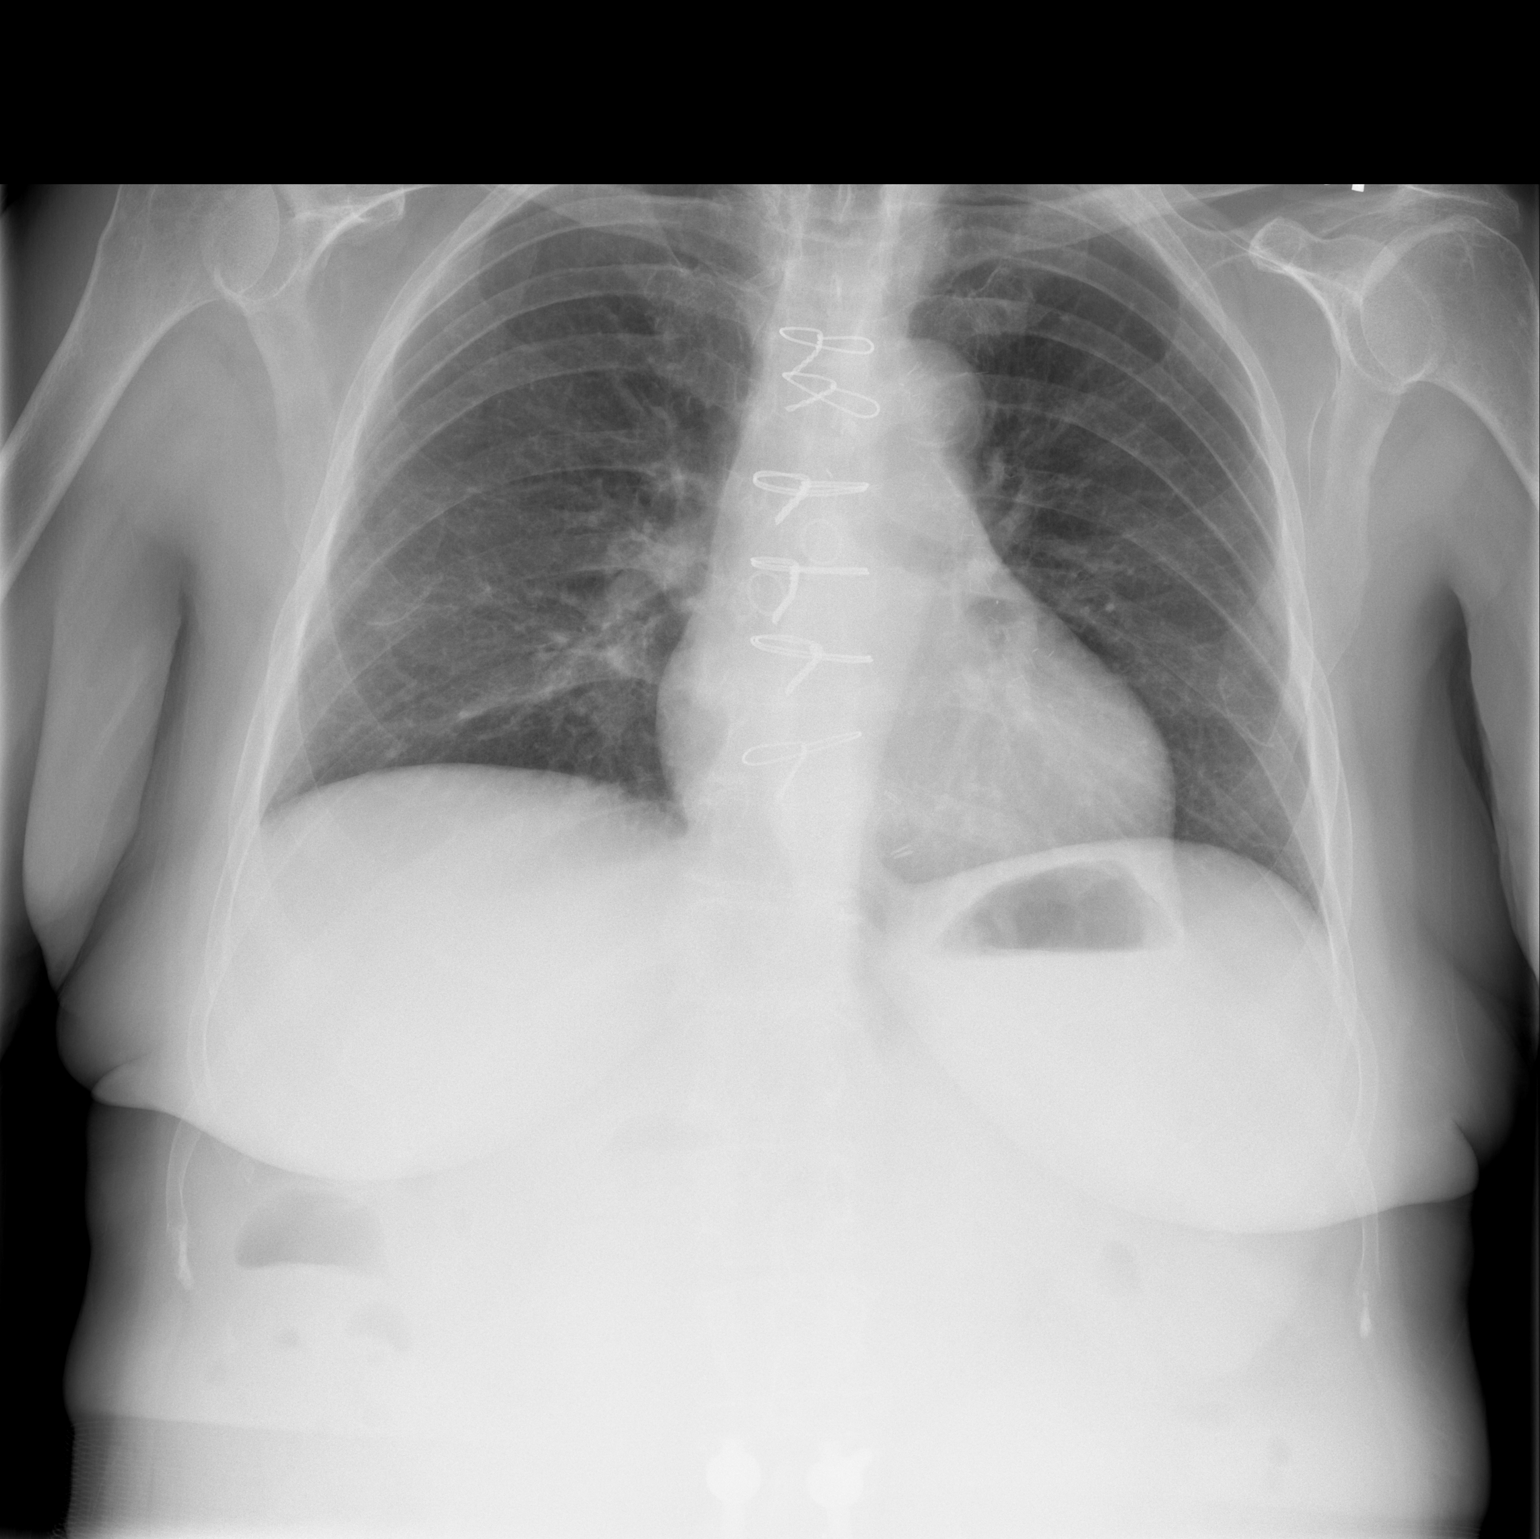

[w chest lat]
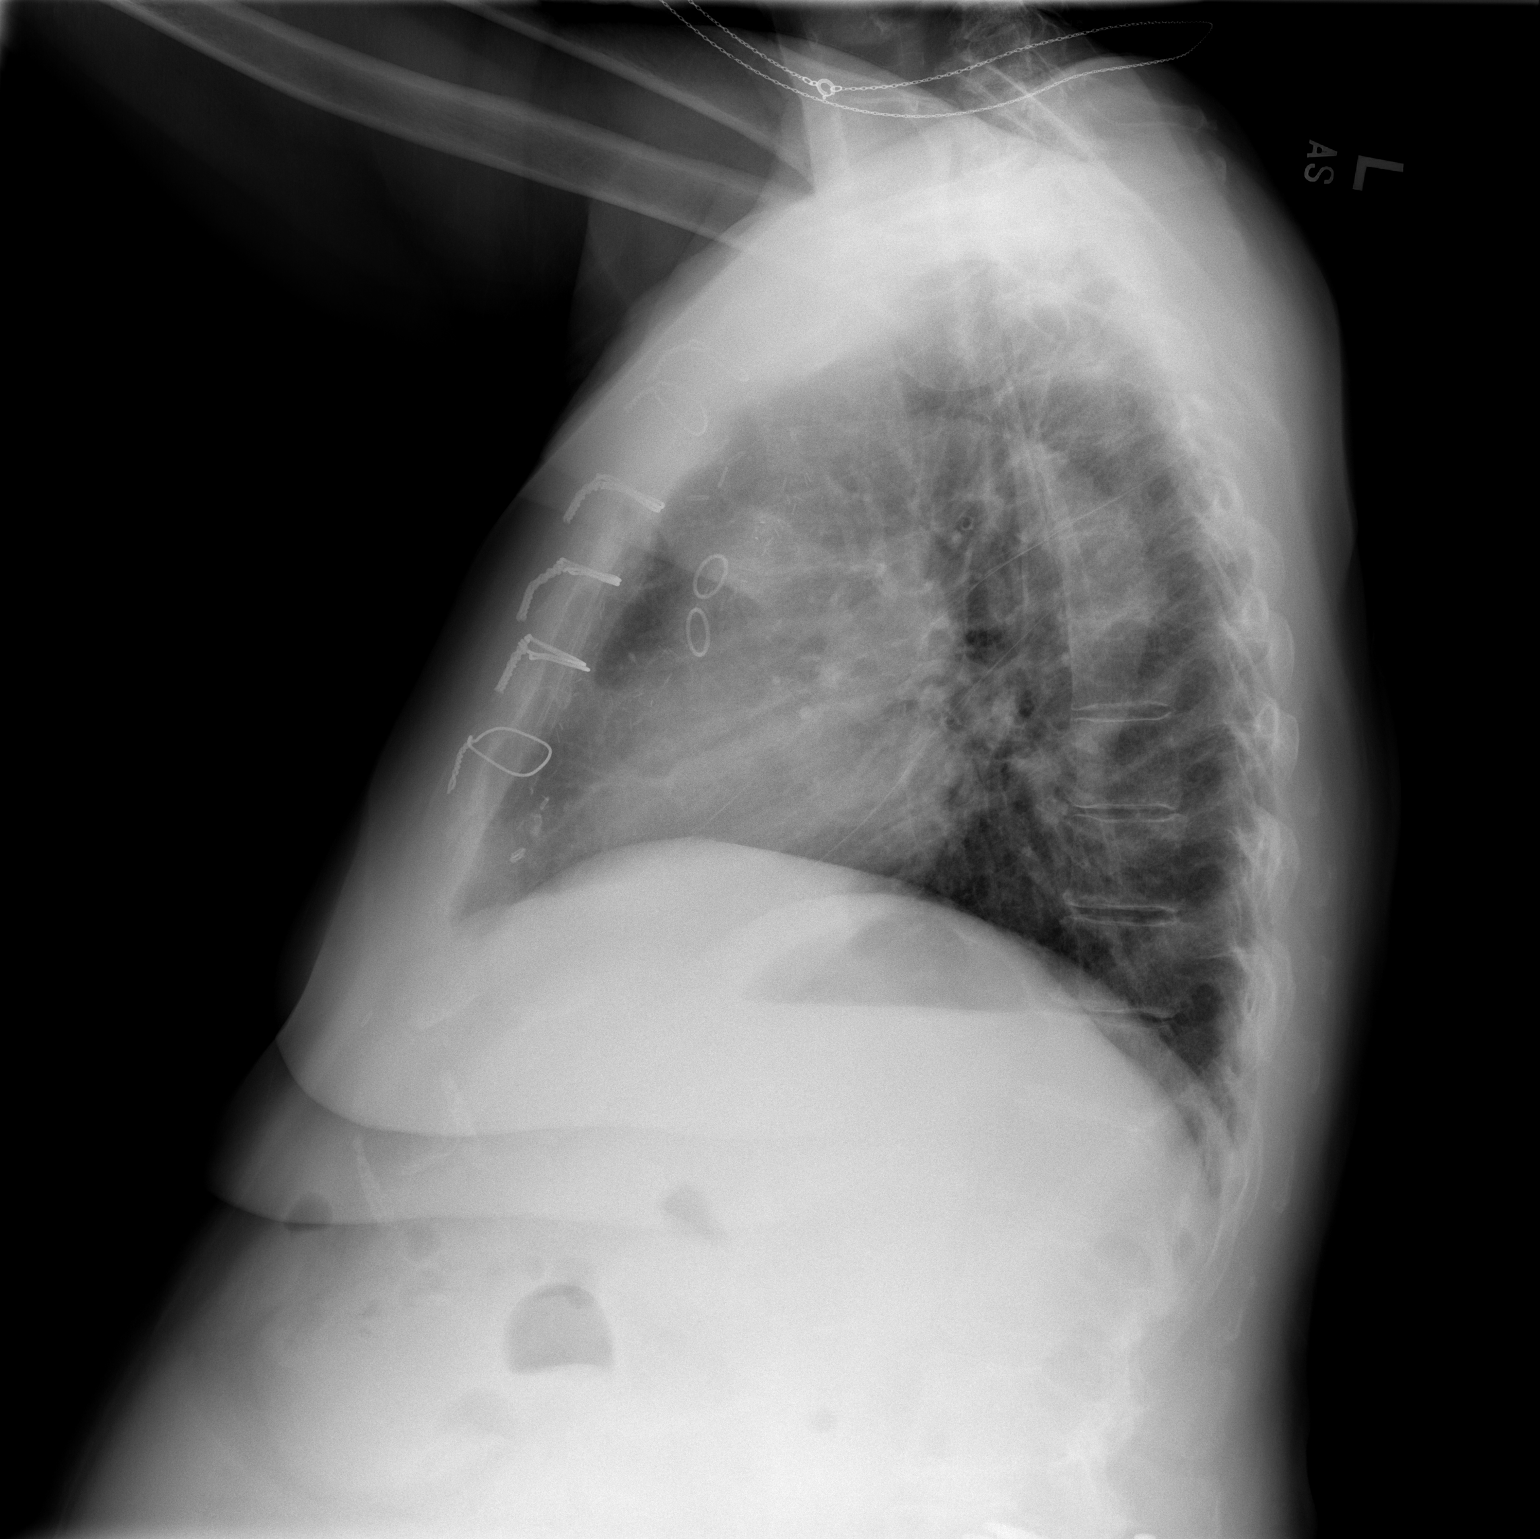

[2 of 2 positions shown; findings below may reference images not displayed]

FINDINGS: The heart size and mediastinal contours are within normal limits.
Both lungs are clear. Status post coronary artery bypass graft. No
pneumothorax or pleural effusion is noted. The visualized skeletal
structures are unremarkable.
IMPRESSION: No active cardiopulmonary disease.

## 2018-01-18 ENCOUNTER — Other Ambulatory Visit: Payer: Self-pay | Admitting: Internal Medicine

## 2018-01-18 DIAGNOSIS — E1165 Type 2 diabetes mellitus with hyperglycemia: Secondary | ICD-10-CM

## 2018-01-18 DIAGNOSIS — Z794 Long term (current) use of insulin: Secondary | ICD-10-CM

## 2018-01-23 ENCOUNTER — Other Ambulatory Visit: Payer: Self-pay | Admitting: Physician Assistant

## 2018-01-25 ENCOUNTER — Other Ambulatory Visit: Payer: Medicare Other

## 2018-01-25 DIAGNOSIS — E1169 Type 2 diabetes mellitus with other specified complication: Secondary | ICD-10-CM | POA: Diagnosis not present

## 2018-01-25 DIAGNOSIS — K7460 Unspecified cirrhosis of liver: Secondary | ICD-10-CM | POA: Diagnosis not present

## 2018-01-25 DIAGNOSIS — I2510 Atherosclerotic heart disease of native coronary artery without angina pectoris: Secondary | ICD-10-CM | POA: Diagnosis not present

## 2018-01-25 DIAGNOSIS — E785 Hyperlipidemia, unspecified: Secondary | ICD-10-CM

## 2018-01-25 DIAGNOSIS — E1165 Type 2 diabetes mellitus with hyperglycemia: Secondary | ICD-10-CM

## 2018-01-26 LAB — LIPID PANEL
Cholesterol: 155 mg/dL (ref <200)
HDL: 58 mg/dL (ref >50)
LDL Cholesterol (Calc): 82 mg/dL (calc)
Non-HDL Cholesterol (Calc): 97 mg/dL (calc) (ref <130)
Total CHOL/HDL Ratio: 2.7 (calc) (ref <5.0)
Triglycerides: 70 mg/dL (ref <150)

## 2018-01-26 LAB — HEMOGLOBIN A1C
Hgb A1c MFr Bld: 9.7 % of total Hgb — ABNORMAL HIGH (ref <5.7)
Mean Plasma Glucose: 232 (calc)
eAG (mmol/L): 12.8 (calc)

## 2018-01-26 LAB — BASIC METABOLIC PANEL
BUN/Creatinine Ratio: 14 (calc) (ref 6–22)
BUN: 16 mg/dL (ref 7–25)
CO2: 28 mmol/L (ref 20–32)
Calcium: 9.4 mg/dL (ref 8.6–10.4)
Chloride: 103 mmol/L (ref 98–110)
Creat: 1.11 mg/dL — ABNORMAL HIGH (ref 0.60–0.93)
Glucose, Bld: 90 mg/dL (ref 65–99)
Potassium: 4.7 mmol/L (ref 3.5–5.3)
Sodium: 139 mmol/L (ref 135–146)

## 2018-01-26 LAB — HEPATIC FUNCTION PANEL
AG Ratio: 1 (calc) (ref 1.0–2.5)
ALT: 78 U/L — ABNORMAL HIGH (ref 6–29)
AST: 70 U/L — ABNORMAL HIGH (ref 10–35)
Albumin: 3.5 g/dL — ABNORMAL LOW (ref 3.6–5.1)
Alkaline phosphatase (APISO): 106 U/L (ref 33–130)
Bilirubin, Direct: 0.3 mg/dL — ABNORMAL HIGH (ref 0.0–0.2)
Globulin: 3.5 g/dL (calc) (ref 1.9–3.7)
Indirect Bilirubin: 0.7 mg/dL (calc) (ref 0.2–1.2)
Total Bilirubin: 1 mg/dL (ref 0.2–1.2)
Total Protein: 7 g/dL (ref 6.1–8.1)

## 2018-01-29 ENCOUNTER — Encounter: Payer: Self-pay | Admitting: Internal Medicine

## 2018-01-29 ENCOUNTER — Ambulatory Visit (INDEPENDENT_AMBULATORY_CARE_PROVIDER_SITE_OTHER): Payer: Medicare Other | Admitting: Internal Medicine

## 2018-01-29 VITALS — BP 105/60 | HR 53 | Temp 98.0°F | Resp 18 | Ht 63.0 in | Wt 149.6 lb

## 2018-01-29 DIAGNOSIS — I25810 Atherosclerosis of coronary artery bypass graft(s) without angina pectoris: Secondary | ICD-10-CM

## 2018-01-29 DIAGNOSIS — K7460 Unspecified cirrhosis of liver: Secondary | ICD-10-CM

## 2018-01-29 DIAGNOSIS — E1165 Type 2 diabetes mellitus with hyperglycemia: Secondary | ICD-10-CM

## 2018-01-29 DIAGNOSIS — E785 Hyperlipidemia, unspecified: Secondary | ICD-10-CM

## 2018-01-29 DIAGNOSIS — I2510 Atherosclerotic heart disease of native coronary artery without angina pectoris: Secondary | ICD-10-CM

## 2018-01-29 DIAGNOSIS — R42 Dizziness and giddiness: Secondary | ICD-10-CM

## 2018-01-29 DIAGNOSIS — Z23 Encounter for immunization: Secondary | ICD-10-CM

## 2018-01-29 DIAGNOSIS — E1169 Type 2 diabetes mellitus with other specified complication: Secondary | ICD-10-CM

## 2018-01-29 NOTE — Patient Instructions (Signed)
Check your sugar after meals beginning tomorrow for 3 days.  Then call me with results and I will adjust your novolog accordingly.

## 2018-01-29 NOTE — Progress Notes (Signed)
Location:  [facility] clinic Provider:  Dionicio Shelnutt L. Mariea Clonts, D.O., C.M.D.  Code Status: full code Goals of Care:  Advanced Directives 09/25/2017  Does Patient Have a Medical Advance Directive? No  Does patient want to make changes to medical advance directive? -  Would patient like information on creating a medical advance directive? Yes (MAU/Ambulatory/Procedural Areas - Information given)     Chief Complaint  Patient presents with  . Medical Management of Chronic Issues    4 Month follow up, unhealing wound on patient's left arm     HPI: Patient is a 72 y.o. female seen today for medical management of chronic diseases.    She has a nonhealing wound on her left arm--says it's hard, it comes and goes, she scrapes it off and it comes back.  Got worried b/c her brother had MRSA.  Brought her CBG meter:   Sometimes forgets or she's not where she can take her novolog.  Uses 55 units levemir and novolog 04/16/11.   Sugars avg over 7 days is 102, 14 days 101, 30 days 115  BP on low end.  Sometimes is dizzy.  Might have to hold onto something if she stands during church.  Holds onto pew. More bothersome in the evening after her bp pill.  Has not fallen.    Past Medical History:  Diagnosis Date  . Allergy   . Arthritis    neck  . Cataract    bilateral - MD monitoring cataracts  . CHF (congestive heart failure) ([place])   . Chronic kidney disease, stage I    DR OTTELIN  HX UTIS  . Cirrhosis ([place])   . Cramp of limb   . Diabetes mellitus   . Dysphagia, unspecified(787.20)   . Dysuria   . Epistaxis   . GERD (gastroesophageal reflux disease)   . Heart murmur    NO CARDIOLOGIST  DX FOR YEARS ASYMPTOMATIC  . Lumbago   . Neoplasm of uncertain behavior of skin   . Nonspecific elevation of levels of transaminase or lactic acid dehydrogenase (LDH)   . Osteoarthrosis, unspecified whether generalized or localized, unspecified site   . Other and unspecified hyperlipidemia    diet controlled  .  Pain in joint, shoulder region   . Paresthesias 04/01/2015  . Postablative ovarian failure   . Trochanteric bursitis of left hip 12/15/2015  . Type 2 diabetes mellitus without complication ([place])   . Unspecified essential hypertension    no meds    Past Surgical History:  Procedure Laterality Date  . BREAST BIOPSY    . CARDIAC CATHETERIZATION N/A 01/27/2016   Procedure: Left Heart Cath and Coronary Angiography;  Surgeon: Belva Crome, MD;  Location: [facility];  Service: Cardiovascular;  Laterality: N/A;  . COLONOSCOPY  2012   [facility] - polyp  . COLONOSCOPY WITH PROPOFOL N/A 07/07/2016   Procedure: COLONOSCOPY WITH PROPOFOL;  Surgeon: [name], MD;  Location: [facility];  Service: Endoscopy;  Laterality: N/A;  . CORONARY ARTERY BYPASS GRAFT N/A 01/28/2016   Procedure: CORONARY ARTERY BYPASS GRAFTING (CABG) x 3 USING RIGHT LEG GREATER SAPHENOUS VEIN GRAFT;  Surgeon: Melrose Nakayama, MD;  Location: [facility];  Service: Open Heart Surgery;  Laterality: N/A;  . ENDOVEIN HARVEST OF GREATER SAPHENOUS VEIN Right 01/28/2016   Procedure: ENDOVEIN HARVEST OF GREATER SAPHENOUS VEIN;  Surgeon: Melrose Nakayama, MD;  Location: [facility];  Service: Open Heart Surgery;  Laterality: Right;  . ESOPHAGOGASTRODUODENOSCOPY (EGD) WITH PROPOFOL N/A 07/07/2016  Procedure: ESOPHAGOGASTRODUODENOSCOPY (EGD) WITH PROPOFOL;  Surgeon: [name], MD;  Location: [facility];  Service: Endoscopy;  Laterality: N/A;  . MAXIMUM ACCESS (MAS)POSTERIOR LUMBAR INTERBODY FUSION (PLIF) 1 LEVEL Left 06/10/2015   Procedure: FOR MAXIMUM ACCESS (MAS) POSTERIOR LUMBAR INTERBODY FUSION (PLIF) LUMBAR THREE-FOUR EXTRAFORAMINAL MICRODISCECTOMY LUMBAR FIVE-SACRAL ONE LEFT;  Surgeon: Eustace Moore, MD;  Location: [facility];  Service: Neurosurgery;  Laterality: Left;  . TEE WITHOUT CARDIOVERSION N/A 01/28/2016   Procedure: TRANSESOPHAGEAL ECHOCARDIOGRAM (TEE);  Surgeon: Melrose Nakayama, MD;  Location: [facility];  Service:  Open Heart Surgery;  Laterality: N/A;  . TUBAL LIGATION  1982   Dr Connye Burkitt  . UPPER GASTROINTESTINAL ENDOSCOPY    . VAGINAL HYSTERECTOMY  1997   Dr Rande Lawman    Allergies  Allergen Reactions  . Kiwi Extract Anaphylaxis  . Tdap [Tetanus-Diphth-Acell Pertussis] Swelling and Other (See Comments)    Swelling at injection site, gets very hot  . Statins     RHABDOMYOLYSIS  . Latex Itching, Dermatitis and Rash  . Tramadol Nausea And Vomiting    Outpatient Encounter Medications as of 01/29/2018  Medication Sig  . acetaminophen (TYLENOL) 500 MG tablet Take 500 mg by mouth at bedtime as needed for mild pain.  . Aromatic Inhalants (VICKS VAPOR IN) Vicks Vapor Rub apply small amount to outside of nose to help breathing  . aspirin EC 81 MG tablet Take 1 tablet (81 mg total) by mouth daily.  . Biotin 10000 MCG TABS Take 10,000 mcg by mouth every morning.  . calcium carbonate (OS-CAL) 600 MG TABS Take 600 mg by mouth 2 (two) times daily with a meal.    . cetirizine (ZYRTEC) 10 MG tablet Take 10 mg by mouth daily.  . cholecalciferol (VITAMIN D) 1000 UNITS tablet Take 2,000 Units by mouth daily.   . Cyanocobalamin (VITAMIN B 12 PO) Take 1,000 mcg by mouth daily.    [name] Kitchen ezetimibe (ZETIA) 10 MG tablet TAKE 1 TABLET(10 MG) BY MOUTH DAILY  . fish oil-omega-3 fatty acids 1000 MG capsule Take 1 g by mouth daily.    . furosemide (LASIX) 40 MG tablet TAKE 1 TABLET(40 MG) BY MOUTH DAILY  . glucose blood test strip One Touch Ultra II strips. Use to test blood sugar three times daily. Dx: E11.65  . Insulin Syringe-Needle U-100 (INSULIN SYRINGE 1CC/30GX5/16") 30G X 5/16" 1 ML MISC Use daily with levemir injection DX E11.65  . LEVEMIR 100 UNIT/ML injection ADMINISTER 55 UNITS UNDER THE SKIN EVERY DAY  . MAGNESIUM PO Take 500 mg by mouth 2 (two) times daily in the am and at bedtime..  . Multiple Vitamins-Minerals (MULTIVITAMIN WITH MINERALS) tablet Take 1 tablet by mouth daily.    . nadolol (CORGARD) 80 MG tablet TAKE  1 TABLET(80 MG) BY MOUTH DAILY  . NOVOLOG FLEXPEN 100 UNIT/ML FlexPen Inject 12 units in the morning 8 at lunch and 12 at supper to control blood sugar  . omeprazole (PRILOSEC) 20 MG capsule TAKE 1 CAPSULE BY MOUTH DAILY  . Polyethyl Glycol-Propyl Glycol (SYSTANE OP) Place 1 drop into both eyes 2 (two) times daily.  . potassium chloride SA (K-DUR,KLOR-CON) 20 MEQ tablet TAKE 1 TABLET BY MOUTH EVERY DAY  . Probiotic Product (PROBIOTIC DAILY PO) Take 1 capsule by mouth daily. Digestive Advantage Probiotic  . spironolactone (ALDACTONE) 100 MG tablet TAKE 1 TABLET(100 MG) BY MOUTH TWICE DAILY   No facility-administered encounter medications on file as of 01/29/2018.     Review of Systems:  Review  of Systems  Constitutional: Negative for chills and fever.  HENT: Negative for congestion.   Eyes: Negative for blurred vision.  Respiratory: Negative for cough and shortness of breath.   Cardiovascular: Negative for chest pain, palpitations and leg swelling.  Gastrointestinal: Negative for abdominal pain, blood in stool, constipation and melena.  Genitourinary: Negative for dysuria.  Musculoskeletal: Negative for falls and joint pain.  Skin: Negative for itching and rash.       Left forearm raised hyperkeratotic papule with abrasion  Neurological: Positive for dizziness.  Endo/Heme/Allergies: Bruises/bleeds easily.       Diabetes, uncontrolled  Psychiatric/Behavioral: Negative for depression and memory loss.    Health Maintenance  Topic Date Due  . URINE MICROALBUMIN  06/16/2017  . INFLUENZA VACCINE  12/07/2017  . FOOT EXAM  05/29/2018  . HEMOGLOBIN A1C  07/26/2018  . OPHTHALMOLOGY EXAM  08/24/2018  . MAMMOGRAM  09/15/2019  . COLONOSCOPY  07/07/2021  . DEXA SCAN  Completed  . Hepatitis C Screening  Completed  . PNA vac Low Risk Adult  Completed  . TETANUS/TDAP  Discontinued    Physical Exam: Vitals:   01/29/18 1534  BP: 105/60  Pulse: (!) 53  Resp: 18  Temp: 98 F (36.7 C)    TempSrc: Oral  SpO2: 97%  Weight: 149 lb 9.6 oz (67.9 kg)  Height: 5' 3"  (1.6 m)   Body mass index is 26.5 kg/m. Physical Exam  Constitutional: She is oriented to person, place, and time. No distress.  HENT:  Head: Normocephalic and atraumatic.  Cardiovascular: Normal rate, regular rhythm, normal heart sounds and intact distal pulses.  Pulmonary/Chest: Effort normal and breath sounds normal. No respiratory distress.  Abdominal: Bowel sounds are normal.  Musculoskeletal: Normal range of motion.  Neurological: She is alert and oriented to person, place, and time.  Skin: Skin is warm and dry.  Psychiatric: She has a normal mood and affect. Her behavior is normal. Judgment and thought content normal.    Labs reviewed: Basic Metabolic Panel: Recent Labs    08/14/17 1616 09/18/17 1142 01/25/18 0935  NA 136 138 139  K 4.0 4.3 4.7  CL 100 101 103  CO2 30 32 28  GLUCOSE 60* 79 90  BUN 19 18 16   CREATININE 1.01 1.04* 1.11*  CALCIUM 10.1 10.7* 9.4   Liver Function Tests: Recent Labs    04/03/17 1601  08/14/17 1616 09/18/17 1142 01/25/18 0935  AST 43*   < > 60* 82* 70*  ALT 40*   < > 54* 75* 78*  ALKPHOS 106  --  117  --   --   BILITOT 0.8   < > 0.8 0.8 1.0  PROT 7.7   < > 7.7 7.1 7.0  ALBUMIN 3.5  --  3.5  --   --    < > = values in this interval not displayed.   No results for input(s): LIPASE, AMYLASE in the last 8760 hours. No results for input(s): AMMONIA in the last 8760 hours. CBC: Recent Labs    04/03/17 1601 08/14/17 1616  WBC 5.6 7.0  NEUTROABS 3.2 3.8  HGB 13.3 13.4  HCT 40.3 40.5  MCV 92.1 88.7  PLT 129.0* 134.0*   Lipid Panel: Recent Labs    05/25/17 0940 01/25/18 0935  CHOL 200* 155  HDL 55 58  LDLCALC 128* 82  TRIG 80 70  CHOLHDL 3.6 2.7   Lab Results  Component Value Date   HGBA1C 9.7 (H) 01/25/2018   Assessment/Plan 1.  Dizziness -suspect due to bp running low after nadolol in evenings--will check with Dr. [name] about decreasing  to the 38m from 884mto see if her dizziness will resolve  2. Need for influenza vaccination - Flu vaccine HIGH DOSE PF (Fluzone High dose)  3. Uncontrolled type 2 diabetes mellitus with hyperglycemia (HCC) - hba1c down below 10, but apparently postprandial sugars remain elevated (b/c fastings are satisfactory) - Microalbumin / creatinine urine ratio - COMPLETE METABOLIC PANEL WITH GFR; Future - Hemoglobin A1c; Future - Lipid panel; Future  4. Cirrhosis of liver without ascites, unspecified hepatic cirrhosis type (HCC) -ongoing, on diuretic regimen with aldactone and lasix -having low bps with dizziness so will check with Dr. JaArdis Hughso see if reducing dose is safe--she seems stable from this perspective without significant edema or ascites at present  5. Coronary artery disease involving native coronary artery of native heart without angina pectoris - cont current regimen, no symptoms, doing fine - Lipid panel; Future  6. Hyperlipidemia associated with type 2 diabetes mellitus ([place]-cont zetia daily - Lipid panel; Future  Labs/tests ordered:   Orders Placed This Encounter  Procedures  . Flu vaccine HIGH DOSE PF (Fluzone High dose)  . Microalbumin / creatinine urine ratio  . COMPLETE METABOLIC PANEL WITH GFR    Standing Status:   Future    Standing Expiration Date:   01/30/2019  . Hemoglobin A1c    Standing Status:   Future    Standing Expiration Date:   01/30/2019  . Lipid panel    Standing Status:   Future    Standing Expiration Date:   01/30/2019    Next appt:  05/30/2018   Lynnie Koehler L. Shakirah Kirkey, D.O. [facility] [address] [address] Phone (Mon-Fri 8am-5pm):  [phone] Call:  [phone] follow prompts after 5pm & weekends Office Phone:  [phone] Fax:  [phone]

## 2018-01-30 ENCOUNTER — Telehealth: Payer: Self-pay | Admitting: *Deleted

## 2018-01-30 DIAGNOSIS — K7460 Unspecified cirrhosis of liver: Secondary | ICD-10-CM

## 2018-01-30 LAB — MICROALBUMIN / CREATININE URINE RATIO
Creatinine, Urine: 96 mg/dL (ref 20–275)
Microalb Creat Ratio: 15 mcg/mg creat (ref <30)
Microalb, Ur: 1.4 mg/dL

## 2018-01-30 MED ORDER — NADOLOL 40 MG PO TABS: 40.0000 mg | ORAL_TABLET | Freq: Every day | ORAL | 3 refills | Status: DC

## 2018-01-30 NOTE — Addendum Note (Signed)
Addended by: Gayland Curry on: 01/30/2018 08:22 AM   Modules accepted: Orders

## 2018-01-30 NOTE — Telephone Encounter (Signed)
-----   Message from Gayland Curry, DO sent at 01/30/2018  8:23 AM EDT ----- Please notify Mrs. Zenk that she can decrease her nadolol from 34m daily to 452mdaily (ok'd by Dr. JaArdis Hughs  She may cut her current tablets in half.  If that's not possible, we can send in a new prescription.

## 2018-01-30 NOTE — Telephone Encounter (Signed)
Spoke with patient and advised results rx sent to pharmacy by e-script  

## 2018-02-01 ENCOUNTER — Encounter: Payer: Self-pay | Admitting: *Deleted

## 2018-02-02 ENCOUNTER — Telehealth: Payer: Self-pay | Admitting: *Deleted

## 2018-02-02 MED ORDER — NOVOLOG FLEXPEN 100 UNIT/ML ~~LOC~~ SOPN: PEN_INJECTOR | SUBCUTANEOUS | 11 refills | Status: DC

## 2018-02-02 NOTE — Telephone Encounter (Signed)
Patient notified and agreed.  New Rx for Novolog faxed to pharmacy.

## 2018-02-02 NOTE — Telephone Encounter (Signed)
Let's keep levemir as is. Increase novolog to 12 units (same) after breakfast, 12 units after lunch and 14 units after supper.   She should continue to check her sugars for another three days like she did here and call with results.   She should definitely look out for early morning low sugars.  That's my concern for her.   She had said she would need the insulin reordered with the new dosage.   Thanks!

## 2018-02-02 NOTE — Telephone Encounter (Signed)
Patient called with Blood Sugar Readings requested by Dr. Mariea Clonts  01/30/2018  103 before breakfast        134 after breakfast        278 after lunch        301 after dinner  01/31/2018  106 before breakfast        123 after breakfast        286 after lunch        255 after dinner  02/01/2018  83 before breakfast        258 after lunch        209 after dinner  02/02/2018  118 before breakfast  Current Dose of Novolog 12u,8u,12u Levimer 55units daily  Please Advise.

## 2018-02-07 ENCOUNTER — Other Ambulatory Visit: Payer: Self-pay | Admitting: Internal Medicine

## 2018-02-07 DIAGNOSIS — Z794 Long term (current) use of insulin: Secondary | ICD-10-CM

## 2018-02-07 DIAGNOSIS — E1165 Type 2 diabetes mellitus with hyperglycemia: Secondary | ICD-10-CM

## 2018-02-12 ENCOUNTER — Telehealth: Payer: Self-pay | Admitting: *Deleted

## 2018-02-12 ENCOUNTER — Other Ambulatory Visit: Payer: Self-pay | Admitting: Pharmacotherapy

## 2018-02-12 NOTE — Telephone Encounter (Signed)
Patient called and left message on Clinical Intake needing to call in blood sugar readings.   Called and [facility] for patient to [facility].

## 2018-02-13 NOTE — Telephone Encounter (Signed)
Blood Sugar Reading:  02/09/2018- am 73 pm 212 pm 104 02/10/2018- am 108 pm 341 pm 282 02/11/2018- am 100 pm 119 pm 285 02/12/2018 02/13/2018-am 51 (ate 1/2 banana with Peanut butter and brought up to 105)  Please Advise.

## 2018-02-13 NOTE — Telephone Encounter (Signed)
Let's try 04/20/11 of the novolog.  Seems like she's at risk for lows with 14 units at supper.  The other option is to take 14 units, but take a small bedtime snack like the 1/2 banana with peanut butter.  She needs to try to be consistent about her food choices to get her sugars stabilized.

## 2018-02-13 NOTE — Telephone Encounter (Signed)
Patient notified and agreed to the 04/20/11. Medication list updated

## 2018-02-13 NOTE — Telephone Encounter (Signed)
LMOM to return call.

## 2018-02-14 ENCOUNTER — Telehealth: Payer: Self-pay | Admitting: *Deleted

## 2018-02-14 NOTE — Telephone Encounter (Signed)
Patient dropped off [employer]. Placed in Dr. Cyndi Lennert folder to review and compose letter. Call patient once ready for pick up.

## 2018-02-14 NOTE — Telephone Encounter (Signed)
Patient called requesting a letter for exemption from [employer] duty due to health.   Instructed patient to drop off Jury duty summons and we would give it to Dr. Mariea Clonts and call her once the letter was written. Patient agreed.

## 2018-02-15 ENCOUNTER — Encounter: Payer: Self-pay | Admitting: Internal Medicine

## 2018-02-15 NOTE — Telephone Encounter (Signed)
Letter completed and returned to [place].

## 2018-02-15 NOTE — Telephone Encounter (Addendum)
Patient notified and will pick up. Copy of summons sent for scanning.  Letter and summons placed up front in drawer.

## 2018-03-05 DIAGNOSIS — H524 Presbyopia: Secondary | ICD-10-CM | POA: Diagnosis not present

## 2018-03-05 DIAGNOSIS — H5201 Hypermetropia, right eye: Secondary | ICD-10-CM | POA: Diagnosis not present

## 2018-03-05 DIAGNOSIS — H04123 Dry eye syndrome of bilateral lacrimal glands: Secondary | ICD-10-CM | POA: Diagnosis not present

## 2018-03-05 DIAGNOSIS — H2513 Age-related nuclear cataract, bilateral: Secondary | ICD-10-CM | POA: Diagnosis not present

## 2018-03-05 DIAGNOSIS — E119 Type 2 diabetes mellitus without complications: Secondary | ICD-10-CM | POA: Diagnosis not present

## 2018-03-05 DIAGNOSIS — H52222 Regular astigmatism, left eye: Secondary | ICD-10-CM | POA: Diagnosis not present

## 2018-03-05 DIAGNOSIS — H35373 Puckering of macula, bilateral: Secondary | ICD-10-CM | POA: Diagnosis not present

## 2018-03-07 ENCOUNTER — Other Ambulatory Visit: Payer: Self-pay | Admitting: Internal Medicine

## 2018-03-08 ENCOUNTER — Other Ambulatory Visit: Payer: Self-pay | Admitting: Internal Medicine

## 2018-03-08 DIAGNOSIS — E876 Hypokalemia: Secondary | ICD-10-CM

## 2018-03-22 ENCOUNTER — Other Ambulatory Visit: Payer: Self-pay | Admitting: Gastroenterology

## 2018-03-26 ENCOUNTER — Ambulatory Visit: Payer: Medicare Other | Admitting: Gastroenterology

## 2018-04-14 ENCOUNTER — Other Ambulatory Visit: Payer: Self-pay | Admitting: Pharmacotherapy

## 2018-04-23 ENCOUNTER — Other Ambulatory Visit: Payer: Self-pay | Admitting: Internal Medicine

## 2018-04-23 DIAGNOSIS — Z794 Long term (current) use of insulin: Secondary | ICD-10-CM

## 2018-04-23 DIAGNOSIS — E1165 Type 2 diabetes mellitus with hyperglycemia: Secondary | ICD-10-CM

## 2018-04-25 ENCOUNTER — Ambulatory Visit (INDEPENDENT_AMBULATORY_CARE_PROVIDER_SITE_OTHER): Payer: Medicare Other | Admitting: Gastroenterology

## 2018-04-25 ENCOUNTER — Other Ambulatory Visit (INDEPENDENT_AMBULATORY_CARE_PROVIDER_SITE_OTHER): Payer: Medicare Other

## 2018-04-25 ENCOUNTER — Encounter: Payer: Self-pay | Admitting: Gastroenterology

## 2018-04-25 VITALS — BP 100/66 | HR 60 | Ht 63.0 in | Wt 155.0 lb

## 2018-04-25 DIAGNOSIS — C220 Liver cell carcinoma: Secondary | ICD-10-CM

## 2018-04-25 DIAGNOSIS — I25810 Atherosclerosis of coronary artery bypass graft(s) without angina pectoris: Secondary | ICD-10-CM

## 2018-04-25 DIAGNOSIS — K7469 Other cirrhosis of liver: Secondary | ICD-10-CM | POA: Diagnosis not present

## 2018-04-25 LAB — COMPREHENSIVE METABOLIC PANEL
ALT: 80 U/L — ABNORMAL HIGH (ref 0–35)
AST: 76 U/L — ABNORMAL HIGH (ref 0–37)
Albumin: 3.4 g/dL — ABNORMAL LOW (ref 3.5–5.2)
Alkaline Phosphatase: 107 U/L (ref 39–117)
BUN: 21 mg/dL (ref 6–23)
CO2: 31 meq/L (ref 19–32)
Calcium: 10.4 mg/dL (ref 8.4–10.5)
Chloride: 103 mEq/L (ref 96–112)
Creatinine, Ser: 1.26 mg/dL — ABNORMAL HIGH (ref 0.40–1.20)
GFR: 44.44 mL/min — ABNORMAL LOW (ref >60.00)
Glucose, Bld: 219 mg/dL — ABNORMAL HIGH (ref 70–99)
Potassium: 3.9 mEq/L (ref 3.5–5.1)
SODIUM: 141 meq/L (ref 135–145)
Total Bilirubin: 0.8 mg/dL (ref 0.2–1.2)
Total Protein: 7 g/dL (ref 6.0–8.3)

## 2018-04-25 LAB — CBC WITH DIFFERENTIAL/PLATELET
Basophils Absolute: 0.1 10*3/uL (ref 0.0–0.1)
Basophils Relative: 1.1 % (ref 0.0–3.0)
Eosinophils Absolute: 0.1 10*3/uL (ref 0.0–0.7)
Eosinophils Relative: 2.1 % (ref 0.0–5.0)
HCT: 42.2 % (ref 36.0–46.0)
Hemoglobin: 14.1 g/dL (ref 12.0–15.0)
Lymphocytes Relative: 25.9 % (ref 12.0–46.0)
Lymphs Abs: 1.3 10*3/uL (ref 0.7–4.0)
MCHC: 33.5 g/dL (ref 30.0–36.0)
MCV: 95.3 fl (ref 78.0–100.0)
Monocytes Absolute: 0.7 10*3/uL (ref 0.1–1.0)
Monocytes Relative: 14 % — ABNORMAL HIGH (ref 3.0–12.0)
Neutro Abs: 2.8 10*3/uL (ref 1.4–7.7)
Neutrophils Relative %: 56.9 % (ref 43.0–77.0)
PLATELETS: 101 10*3/uL — AB (ref 150.0–400.0)
RBC: 4.43 Mil/uL (ref 3.87–5.11)
RDW: 13.4 % (ref 11.5–15.5)
WBC: 5 10*3/uL (ref 4.0–10.5)

## 2018-04-25 LAB — PROTIME-INR
INR: 1.1 ratio — ABNORMAL HIGH (ref 0.8–1.0)
PROTHROMBIN TIME: 12.7 s (ref 9.6–13.1)

## 2018-04-25 NOTE — Progress Notes (Signed)
Review of pertinent gastrointestinal problems: 1. Personal history of adenomatous colon polyps. Colonoscopy Dr. [name] 2012 August was normal except for 2 subcentimeter polyps, one was a tubular adenoma. She was recommended to have recall colonoscopy at five-year interval. She had also had previous colonoscopies with Dr. Lajoyce Corners and found to have adenomas as well.Colonoscopy Dr. [name] 07/2016 one subCM TA, recall at 5 years 2. Cirrhosis: likely from Fatty liver  Paracentesis 11/20174 liters, elevated SAAG, no SBP, cytology neg for malignancy  Most recent imaging:03/2017 [place]: cirrhosis, splenomegaly, NEW partial (non-occlusive) PV thrombus, Gallstones and new "mild dilation of intra and extrahepatic bile ducts"; MRI 03/2017 biliary findings probably from extensive periportal fibrosis, small gallstones in GB, non-occlusive PV thrombus appears chronic.  [place] 10/2017: gallstones, thick GB, cirrhosis without masses in liver  Most recent AFP:09/2017 normal  MELD-NA; 08/2017 labs: 7  Most recent EGD:07/2016 EGD: large varices, + portal gastropathy; started nadolol after discussion with cardiology(nadolol 91m once dialy); no recall necessary since on Nadolol.  Immunization for Hep A/B:Started 06/2016  PV thrombus 2018 imaging, non-occlusive and chronic per MR; hematology workup and I agreed; no plans for anticoag (given large varices)   HPI: This is a very pleasant 72year old woman whom I last saw about 8 months ago.  She is here for routine cirrhosis follow-up.  She has put on about 12 pounds since her last visit.  No difficulty with encephalopathy, overt bleeding, extremity or abdominal edema.   Chief complaint is cirrhosis  ROS: complete GI ROS as described in HPI, all other review negative.  Constitutional:  No unintentional weight loss   Past Medical History:  Diagnosis Date  . Allergy   . Arthritis    neck  . Cataract    bilateral - MD monitoring cataracts  . CHF (congestive heart  failure) ([facility]   . Chronic kidney disease, stage I    DR OTTELIN  HX UTIS  . Cirrhosis ([facility]   . Cramp of limb   . Diabetes mellitus   . Dysphagia, unspecified(787.20)   . Dysuria   . Epistaxis   . GERD (gastroesophageal reflux disease)   . Heart murmur    NO CARDIOLOGIST  DX FOR YEARS ASYMPTOMATIC  . Lumbago   . Neoplasm of uncertain behavior of skin   . Nonspecific elevation of levels of transaminase or lactic acid dehydrogenase (LDH)   . Osteoarthrosis, unspecified whether generalized or localized, unspecified site   . Other and unspecified hyperlipidemia    diet controlled  . Pain in joint, shoulder region   . Paresthesias 04/01/2015  . Postablative ovarian failure   . Trochanteric bursitis of left hip 12/15/2015  . Type 2 diabetes mellitus without complication ([facility]   . Unspecified essential hypertension    no meds    Past Surgical History:  Procedure Laterality Date  . BREAST BIOPSY    . CARDIAC CATHETERIZATION N/A 01/27/2016   Procedure: Left Heart Cath and Coronary Angiography;  Surgeon: HBelva Crome MD;  [facility];  Service: Cardiovascular;  Laterality: N/A;  . COLONOSCOPY  2012   Kendelle Schweers - polyp  . COLONOSCOPY WITH PROPOFOL N/A 07/07/2016   Procedure: COLONOSCOPY WITH PROPOFOL;  Surgeon: DMilus Banister MD;  Location: [facility];  Service: Endoscopy;  Laterality: N/A;  . CORONARY ARTERY BYPASS GRAFT N/A 01/28/2016   Procedure: CORONARY ARTERY BYPASS GRAFTING (CABG) x 3 USING RIGHT LEG GREATER SAPHENOUS VEIN GRAFT;  Surgeon: SMelrose Nakayama MD;  [facility]  Service: Open Heart Surgery;  Laterality: N/A;  . ENDOVEIN HARVEST OF GREATER SAPHENOUS VEIN Right 01/28/2016   Procedure: ENDOVEIN HARVEST OF GREATER SAPHENOUS VEIN;  Surgeon: Melrose Nakayama, MD;  Location: [facility];  Service: Open Heart Surgery;  Laterality: Right;  . ESOPHAGOGASTRODUODENOSCOPY (EGD) WITH PROPOFOL N/A 07/07/2016   Procedure: ESOPHAGOGASTRODUODENOSCOPY (EGD) WITH  PROPOFOL;  Surgeon: [name], MD;  Location: [facility];  Service: Endoscopy;  Laterality: N/A;  . MAXIMUM ACCESS (MAS)POSTERIOR LUMBAR INTERBODY FUSION (PLIF) 1 LEVEL Left 06/10/2015   Procedure: FOR MAXIMUM ACCESS (MAS) POSTERIOR LUMBAR INTERBODY FUSION (PLIF) LUMBAR THREE-FOUR EXTRAFORAMINAL MICRODISCECTOMY LUMBAR FIVE-SACRAL ONE LEFT;  Surgeon: Eustace Moore, MD;  Location: [facility];  Service: Neurosurgery;  Laterality: Left;  . TEE WITHOUT CARDIOVERSION N/A 01/28/2016   Procedure: TRANSESOPHAGEAL ECHOCARDIOGRAM (TEE);  Surgeon: Melrose Nakayama, MD;  Location: [facility];  Service: Open Heart Surgery;  Laterality: N/A;  . TUBAL LIGATION  1982   Dr Connye Burkitt  . UPPER GASTROINTESTINAL ENDOSCOPY    . VAGINAL HYSTERECTOMY  1997   Dr Rande Lawman    Current Outpatient Medications  Medication Sig Dispense Refill  . acetaminophen (TYLENOL) 500 MG tablet Take 500 mg by mouth at bedtime as needed for mild pain.    . Aromatic Inhalants (VICKS VAPOR IN) Vicks Vapor Rub apply small amount to outside of nose to help breathing    . aspirin EC 81 MG tablet Take 1 tablet (81 mg total) by mouth daily. 90 tablet 3  . BD PEN NEEDLE NANO U/F 32G X 4 MM MISC USE THREE TIMES DAILY AS DIRECTED 100 each 6  . Biotin 10000 MCG TABS Take 10,000 mcg by mouth every morning.    . calcium carbonate (OS-CAL) 600 MG TABS Take 600 mg by mouth 2 (two) times daily with a meal.      . cholecalciferol (VITAMIN D) 1000 UNITS tablet Take 2,000 Units by mouth daily.     . Cyanocobalamin (VITAMIN B 12 PO) Take 1,000 mcg by mouth daily.      [name] Kitchen ezetimibe (ZETIA) 10 MG tablet TAKE 1 TABLET(10 MG) BY MOUTH DAILY 90 tablet 1  . fish oil-omega-3 fatty acids 1000 MG capsule Take 1 g by mouth daily.      . furosemide (LASIX) 40 MG tablet TAKE 1 TABLET(40 MG) BY MOUTH DAILY 90 tablet 1  . glucose blood test strip One Touch Ultra II strips. Use to test blood sugar three times daily. Dx: E11.65 300 each 3  . Insulin Syringe-Needle U-100  (INSULIN SYRINGE 1CC/30GX5/16") 30G X 5/16" 1 ML MISC USE DAILY WITH LEVEMIR INJECTION 100 each 0  . LEVEMIR 100 UNIT/ML injection ADMINISTER 55 UNITS UNDER THE SKIN EVERY DAY 10 mL 3  . MAGNESIUM PO Take 500 mg by mouth 2 (two) times daily in the am and at bedtime..    . Multiple Vitamins-Minerals (MULTIVITAMIN WITH MINERALS) tablet Take 1 tablet by mouth daily.      . nadolol (CORGARD) 40 MG tablet Take 1 tablet (40 mg total) by mouth daily. 90 tablet 3  . NOVOLOG FLEXPEN 100 UNIT/ML FlexPen Inject 12 units after breakfast, 12units after lunch and 14units after supper to control blood sugar (Patient taking differently: Inject 12 units after breakfast, 12units after lunch and 12units after supper to control blood sugar) 15 mL 11  . omeprazole (PRILOSEC) 20 MG capsule TAKE 1 CAPSULE BY MOUTH DAILY 90 capsule 1  . Polyethyl Glycol-Propyl Glycol (SYSTANE OP) Place 1 drop into both eyes 2 (two) times daily.    [name] Kitchen  potassium chloride SA (K-DUR,KLOR-CON) 20 MEQ tablet TAKE 1 TABLET BY MOUTH EVERY DAY 90 tablet 0  . Probiotic Product (PROBIOTIC DAILY PO) Take 1 capsule by mouth daily. Digestive Advantage Probiotic    . spironolactone (ALDACTONE) 100 MG tablet TAKE 1 TABLET(100 MG) BY MOUTH TWICE DAILY 180 tablet 0   No current facility-administered medications for this visit.     Allergies as of 04/25/2018 - Review Complete 04/25/2018  Allergen Reaction Noted  . Kiwi extract Anaphylaxis 05/28/2015  . Tdap [tetanus-diphth-acell pertussis] Swelling and Other (See Comments) 03/27/2013  . Statins  05/08/2016  . Latex Itching, Dermatitis, and Rash 12/22/2010  . Tramadol Nausea And Vomiting 05/28/2015    Family History  Problem Relation Age of Onset  . Lung cancer Father   . Arthritis Sister   . Arthritis Brother   . Heart disease Maternal Grandmother   . Heart disease Maternal Grandfather   . Heart disease Paternal Grandmother   . Heart disease Paternal Grandfather   . Breast cancer Mother   .  Liver cancer Brother   . Breast cancer Maternal Aunt   . Breast cancer Paternal Aunt   . Colon cancer Neg Hx   . Esophageal cancer Neg Hx   . Rectal cancer Neg Hx   . Stomach cancer Neg Hx     Social History   Socioeconomic History  . Marital status: Married    Spouse name: Not on file  . Number of children: 6  . Years of education: Not on file  . Highest education level: Not on file  Occupational History  . Occupation: retired  Scientific laboratory technician  . Financial resource strain: Not hard at all  . Food insecurity:    Worry: Never true    Inability: Never true  . Transportation needs:    Medical: No    Non-medical: No  Tobacco Use  . Smoking status: Never Smoker  . Smokeless tobacco: Never Used  Substance and Sexual Activity  . Alcohol use: No  . Drug use: No  . Sexual activity: Yes    Partners: Male    Birth control/protection: Post-menopausal, Surgical    Comment: Hysterectomy  Lifestyle  . Physical activity:    Days per week: 0 days    Minutes per session: 0 min  . Stress: Not at all  Relationships  . Social connections:    Talks on phone: More than three times a week    Gets together: Twice a week    Attends religious service: More than 4 times per year    Active member of club or organization: No    Attends meetings of clubs or organizations: Never    Relationship status: Married  . Intimate partner violence:    Fear of current or ex partner: No    Emotionally abused: No    Physically abused: No    Forced sexual activity: No  Other Topics Concern  . Not on file  Social History Narrative  . Not on file     Physical Exam: BP 100/66   Pulse 60   Ht 5' 3"  (1.6 m)   Wt 155 lb (70.3 kg)   BMI 27.46 kg/m  Constitutional: generally well-appearing Psychiatric: alert and oriented x3 Abdomen: soft, nontender, nondistended, no obvious ascites, no peritoneal signs, normal bowel sounds No peripheral edema noted in lower extremities  Assessment and plan: 72  y.o. female with [name] related cirrhosis  Although she has put on 12 pounds since her last visit here 8  months ago I do not think this is necessarily edema related to her liver.  She has 0 lower extremity edema and her abdomen although a bit large does not have a fluid wave or suggestion of ascites.  She has no symptoms of clinical decompensation.  Not bothered by encephalopathy.  No overt bleeding.  Her nadolol is at effective dosing based on pulse rate and blood pressure.  She is due for hepatoma screening with alpha-fetoprotein and liver ultrasound and we will arrange for that.  I would also like to restage her liver disease to recalculate her meld score.  Her last meld score was 7.  After seeing those results I will likely arrange follow-up in about 6 months and sooner if any troubles.  Please see the "Patient Instructions" section for addition details about the plan.  Owens Loffler, MD [facility] 04/25/2018, 2:27 PM

## 2018-04-25 NOTE — Patient Instructions (Addendum)
You will have labs checked today in the basement lab.  Please head down after you check out with the front desk  (cbc, cmet, inr, AFP for cirrhosis restaging, hepatoma screening).  You will be set up for an ultrasound of liver for hepatoma screening.  It is important that you have a relatively low salt diet.  High salt diet can cause fluid to accumulate in your legs, abdomen and even around your lungs. You should try to avoid NSAID type over the counter pain medicines as best as possible. Tylenol is safe to take for 'routine' aches and pains, but never take more than 1/2 the dose suggested on the package instructions (never more than 2 grams per day). Avoid alcohol.  You have been scheduled for an abdominal ultrasound at [facility] (1st floor of hospital) on 05/04/18 at 8am. Please arrive 15 minutes prior to your appointment for registration. Make certain not to have anything to eat or drink 6 hours prior to your appointment. Should you need to reschedule your appointment, please contact radiology at [phone]. This test typically takes about 30 minutes to perform.  Thank you for entrusting me with your care and choosing [facility].  Dr [name]

## 2018-04-26 ENCOUNTER — Encounter: Payer: Self-pay | Admitting: Internal Medicine

## 2018-04-26 LAB — AFP TUMOR MARKER: AFP-Tumor Marker: 2.6 ng/mL

## 2018-05-01 ENCOUNTER — Other Ambulatory Visit: Payer: Self-pay

## 2018-05-01 DIAGNOSIS — K7469 Other cirrhosis of liver: Secondary | ICD-10-CM

## 2018-05-04 ENCOUNTER — Telehealth: Payer: Self-pay | Admitting: Gastroenterology

## 2018-05-04 ENCOUNTER — Ambulatory Visit (HOSPITAL_COMMUNITY)
Admission: RE | Admit: 2018-05-04 | Discharge: 2018-05-04 | Disposition: A | Discharge status: Home / Self Care | Payer: Medicare Other | Source: Ambulatory Visit | Attending: Gastroenterology | Admitting: Gastroenterology

## 2018-05-04 DIAGNOSIS — K7469 Other cirrhosis of liver: Secondary | ICD-10-CM | POA: Insufficient documentation

## 2018-05-04 DIAGNOSIS — K8020 Calculus of gallbladder without cholecystitis without obstruction: Secondary | ICD-10-CM | POA: Insufficient documentation

## 2018-05-04 DIAGNOSIS — R161 Splenomegaly, not elsewhere classified: Secondary | ICD-10-CM | POA: Insufficient documentation

## 2018-05-04 DIAGNOSIS — K7460 Unspecified cirrhosis of liver: Secondary | ICD-10-CM | POA: Diagnosis not present

## 2018-05-04 DIAGNOSIS — C220 Liver cell carcinoma: Secondary | ICD-10-CM | POA: Insufficient documentation

## 2018-05-04 NOTE — Telephone Encounter (Signed)
See result note.  

## 2018-05-24 ENCOUNTER — Other Ambulatory Visit: Payer: Medicare Other

## 2018-05-24 DIAGNOSIS — E1169 Type 2 diabetes mellitus with other specified complication: Secondary | ICD-10-CM

## 2018-05-24 DIAGNOSIS — E785 Hyperlipidemia, unspecified: Secondary | ICD-10-CM | POA: Diagnosis not present

## 2018-05-24 DIAGNOSIS — E1165 Type 2 diabetes mellitus with hyperglycemia: Secondary | ICD-10-CM

## 2018-05-24 DIAGNOSIS — I2510 Atherosclerotic heart disease of native coronary artery without angina pectoris: Secondary | ICD-10-CM | POA: Diagnosis not present

## 2018-05-25 LAB — COMPLETE METABOLIC PANEL WITH GFR
AG Ratio: 0.9 (calc) — ABNORMAL LOW (ref 1.0–2.5)
ALT: 53 U/L — ABNORMAL HIGH (ref 6–29)
AST: 50 U/L — ABNORMAL HIGH (ref 10–35)
Albumin: 3.4 g/dL — ABNORMAL LOW (ref 3.6–5.1)
Alkaline phosphatase (APISO): 97 U/L (ref 33–130)
BUN/Creatinine Ratio: 22 (calc) (ref 6–22)
BUN: 23 mg/dL (ref 7–25)
CO2: 29 mmol/L (ref 20–32)
Calcium: 9.7 mg/dL (ref 8.6–10.4)
Chloride: 104 mmol/L (ref 98–110)
Creat: 1.05 mg/dL — ABNORMAL HIGH (ref 0.60–0.93)
GFR, Est African American: 62 mL/min/{1.73_m2} (ref >=60)
GFR, Est Non African American: 53 mL/min/{1.73_m2} — ABNORMAL LOW (ref >=60)
Globulin: 3.6 g/dL (calc) (ref 1.9–3.7)
Glucose, Bld: 85 mg/dL (ref 65–99)
Potassium: 4.1 mmol/L (ref 3.5–5.3)
Sodium: 139 mmol/L (ref 135–146)
Total Bilirubin: 1.2 mg/dL (ref 0.2–1.2)
Total Protein: 7 g/dL (ref 6.1–8.1)

## 2018-05-25 LAB — HEMOGLOBIN A1C
Hgb A1c MFr Bld: 9.2 % of total Hgb — ABNORMAL HIGH (ref <5.7)
Mean Plasma Glucose: 217 (calc)
eAG (mmol/L): 12 (calc)

## 2018-05-25 LAB — LIPID PANEL
Cholesterol: 147 mg/dL (ref <200)
HDL: 53 mg/dL (ref >50)
LDL Cholesterol (Calc): 80 mg/dL (calc)
Non-HDL Cholesterol (Calc): 94 mg/dL (calc) (ref <130)
Total CHOL/HDL Ratio: 2.8 (calc) (ref <5.0)
Triglycerides: 64 mg/dL (ref <150)

## 2018-05-30 ENCOUNTER — Ambulatory Visit (INDEPENDENT_AMBULATORY_CARE_PROVIDER_SITE_OTHER): Payer: Medicare Other | Admitting: Family

## 2018-05-30 ENCOUNTER — Encounter: Payer: Self-pay | Admitting: Family

## 2018-05-30 ENCOUNTER — Ambulatory Visit: Payer: Self-pay

## 2018-05-30 VITALS — BP 100/60 | HR 52 | Temp 98.1°F | Resp 18 | Ht 63.0 in | Wt 147.8 lb

## 2018-05-30 DIAGNOSIS — Z0000 Encounter for general adult medical examination without abnormal findings: Secondary | ICD-10-CM | POA: Diagnosis not present

## 2018-05-30 NOTE — Progress Notes (Signed)
Subjective:   [name] is a 73 y.o. female who presents for Medicare Annual (Subsequent) preventive examination.  Review of Systems:      Objective:     Vitals: BP 100/60   Pulse (!) 52   Temp 98.1 F (36.7 C) (Oral)   Resp 18   Ht 5' 3"  (1.6 m)   Wt 147 lb 12.8 oz (67 kg)   SpO2 98%   BMI 26.18 kg/m   Body mass index is 26.18 kg/m.  Advanced Directives 05/30/2018 01/29/2018 09/25/2017 05/29/2017 02/06/2017 01/26/2017 08/23/2016  Does Patient Have a Medical Advance Directive? No No No No Yes No No  Does patient want to make changes to medical advance directive? - - - - - No - Patient declined -  Would patient like information on creating a medical advance directive? Yes (ED - Information included in AVS) Yes (MAU/Ambulatory/Procedural Areas - Information given) Yes (MAU/Ambulatory/Procedural Areas - Information given) Yes (MAU/Ambulatory/Procedural Areas - Information given) - - -    Tobacco Social History   Tobacco Use  Smoking Status Never Smoker  Smokeless Tobacco Never Used     Counseling given: Not Answered   Clinical Intake:                       Past Medical History:  Diagnosis Date  . Allergy   . Arthritis    neck  . Cataract    bilateral - MD monitoring cataracts  . CHF (congestive heart failure) ([place])   . Chronic kidney disease, stage I    DR OTTELIN  HX UTIS  . Cirrhosis ([facility])   . Cramp of limb   . Diabetes mellitus   . Dysphagia, unspecified(787.20)   . Dysuria   . Epistaxis   . GERD (gastroesophageal reflux disease)   . Heart murmur    NO CARDIOLOGIST  DX FOR YEARS ASYMPTOMATIC  . Lumbago   . Neoplasm of uncertain behavior of skin   . Nonspecific elevation of levels of transaminase or lactic acid dehydrogenase (LDH)   . Osteoarthrosis, unspecified whether generalized or localized, unspecified site   . Other and unspecified hyperlipidemia    diet controlled  . Pain in joint, shoulder region   . Paresthesias 04/01/2015    . Postablative ovarian failure   . Trochanteric bursitis of left hip 12/15/2015  . Type 2 diabetes mellitus without complication ([place])   . Unspecified essential hypertension    no meds   Past Surgical History:  Procedure Laterality Date  . BREAST BIOPSY    . CARDIAC CATHETERIZATION N/A 01/27/2016   Procedure: Left Heart Cath and Coronary Angiography;  Surgeon: Belva Crome, MD;  Location: [facility];  Service: Cardiovascular;  Laterality: N/A;  . COLONOSCOPY  2012   [facility] - polyp  . COLONOSCOPY WITH PROPOFOL N/A 07/07/2016   Procedure: COLONOSCOPY WITH PROPOFOL;  Surgeon: [name], MD;  Location: [facility];  Service: Endoscopy;  Laterality: N/A;  . CORONARY ARTERY BYPASS GRAFT N/A 01/28/2016   Procedure: CORONARY ARTERY BYPASS GRAFTING (CABG) x 3 USING RIGHT LEG GREATER SAPHENOUS VEIN GRAFT;  Surgeon: Melrose Nakayama, MD;  Location: [facility];  Service: Open Heart Surgery;  Laterality: N/A;  . ENDOVEIN HARVEST OF GREATER SAPHENOUS VEIN Right 01/28/2016   Procedure: ENDOVEIN HARVEST OF GREATER SAPHENOUS VEIN;  Surgeon: Melrose Nakayama, MD;  Location: [facility];  Service: Open Heart Surgery;  Laterality: Right;  . ESOPHAGOGASTRODUODENOSCOPY (EGD) WITH PROPOFOL N/A 07/07/2016  Procedure: ESOPHAGOGASTRODUODENOSCOPY (EGD) WITH PROPOFOL;  Surgeon: [name], MD;  Location: [facility];  Service: Endoscopy;  Laterality: N/A;  . MAXIMUM ACCESS (MAS)POSTERIOR LUMBAR INTERBODY FUSION (PLIF) 1 LEVEL Left 06/10/2015   Procedure: FOR MAXIMUM ACCESS (MAS) POSTERIOR LUMBAR INTERBODY FUSION (PLIF) LUMBAR THREE-FOUR EXTRAFORAMINAL MICRODISCECTOMY LUMBAR FIVE-SACRAL ONE LEFT;  Surgeon: Eustace Moore, MD;  Location: [facility];  Service: Neurosurgery;  Laterality: Left;  . TEE WITHOUT CARDIOVERSION N/A 01/28/2016   Procedure: TRANSESOPHAGEAL ECHOCARDIOGRAM (TEE);  Surgeon: Melrose Nakayama, MD;  Location: [facility];  Service: Open Heart Surgery;  Laterality: N/A;  . TUBAL LIGATION  1982    Dr Connye Burkitt  . UPPER GASTROINTESTINAL ENDOSCOPY    . VAGINAL HYSTERECTOMY  1997   Dr Rande Lawman   Family History  Problem Relation Age of Onset  . Lung cancer Father   . Arthritis Sister   . Arthritis Brother   . Heart disease Maternal Grandmother   . Heart disease Maternal Grandfather   . Heart disease Paternal Grandmother   . Heart disease Paternal Grandfather   . Breast cancer Mother   . Liver cancer Brother   . Breast cancer Maternal Aunt   . Breast cancer Paternal Aunt   . Colon cancer Neg Hx   . Esophageal cancer Neg Hx   . Rectal cancer Neg Hx   . Stomach cancer Neg Hx    Social History   Socioeconomic History  . Marital status: Married    Spouse name: Not on file  . Number of children: 6  . Years of education: Not on file  . Highest education level: Not on file  Occupational History  . Occupation: retired  Scientific laboratory technician  . Financial resource strain: Not hard at all  . Food insecurity:    Worry: Never true    Inability: Never true  . Transportation needs:    Medical: No    Non-medical: No  Tobacco Use  . Smoking status: Never Smoker  . Smokeless tobacco: Never Used  Substance and Sexual Activity  . Alcohol use: No  . Drug use: No  . Sexual activity: Yes    Partners: Male    Birth control/protection: Post-menopausal, Surgical    Comment: Hysterectomy  Lifestyle  . Physical activity:    Days per week: 0 days    Minutes per session: 0 min  . Stress: Not at all  Relationships  . Social connections:    Talks on phone: More than three times a week    Gets together: Twice a week    Attends religious service: More than 4 times per year    Active member of club or organization: No    Attends meetings of clubs or organizations: Never    Relationship status: Married  Other Topics Concern  . Not on file  Social History Narrative  . Not on file    Outpatient Encounter Medications as of 05/30/2018  Medication Sig  . acetaminophen (TYLENOL) 500 MG tablet Take  500 mg by mouth at bedtime as needed for mild pain.  . Aromatic Inhalants (VICKS VAPOR IN) Vicks Vapor Rub apply small amount to outside of nose to help breathing  . aspirin EC 81 MG tablet Take 1 tablet (81 mg total) by mouth daily.  . BD PEN NEEDLE NANO U/F 32G X 4 MM MISC USE THREE TIMES DAILY AS DIRECTED  . Biotin 10000 MCG TABS Take 10,000 mcg by mouth every morning.  . calcium carbonate (OS-CAL) 600 MG TABS Take  600 mg by mouth 2 (two) times daily with a meal.    . cholecalciferol (VITAMIN D) 1000 UNITS tablet Take 2,000 Units by mouth daily.   . Cyanocobalamin (VITAMIN B 12 PO) Take 1,000 mcg by mouth daily.    [name] Kitchen ezetimibe (ZETIA) 10 MG tablet TAKE 1 TABLET(10 MG) BY MOUTH DAILY  . Flaxseed, Linseed, (FLAXSEED OIL PO) Take 1 tablet by mouth daily.  . furosemide (LASIX) 40 MG tablet TAKE 1 TABLET(40 MG) BY MOUTH DAILY  . glucose blood test strip One Touch Ultra II strips. Use to test blood sugar three times daily. Dx: E11.65  . insulin aspart (NOVOLOG FLEXPEN) 100 UNIT/ML FlexPen Inject 12 Units into the skin 3 (three) times daily with meals.  . Insulin Syringe-Needle U-100 (INSULIN SYRINGE 1CC/30GX5/16") 30G X 5/16" 1 ML MISC USE DAILY WITH LEVEMIR INJECTION  . LEVEMIR 100 UNIT/ML injection ADMINISTER 55 UNITS UNDER THE SKIN EVERY DAY  . MAGNESIUM PO Take 500 mg by mouth 2 (two) times daily in the am and at bedtime..  . Multiple Vitamins-Minerals (MULTIVITAMIN WITH MINERALS) tablet Take 1 tablet by mouth daily.    . nadolol (CORGARD) 40 MG tablet Take 1 tablet (40 mg total) by mouth daily.  [name] Kitchen omeprazole (PRILOSEC) 20 MG capsule TAKE 1 CAPSULE BY MOUTH DAILY  . Polyethyl Glycol-Propyl Glycol (SYSTANE OP) Place 1 drop into both eyes 2 (two) times daily.  . potassium chloride SA (K-DUR,KLOR-CON) 20 MEQ tablet TAKE 1 TABLET BY MOUTH EVERY DAY  . Probiotic Product (PROBIOTIC DAILY PO) Take 1 capsule by mouth daily. Digestive Advantage Probiotic  . spironolactone (ALDACTONE) 100 MG tablet Take  100 mg by mouth daily.  . [DISCONTINUED] fish oil-omega-3 fatty acids 1000 MG capsule Take 1 g by mouth daily.    . [DISCONTINUED] NOVOLOG FLEXPEN 100 UNIT/ML FlexPen Inject 12 units after breakfast, 12units after lunch and 14units after supper to control blood sugar (Patient taking differently: Inject 12 units after breakfast, 12units after lunch and 12units after supper to control blood sugar)  . [DISCONTINUED] spironolactone (ALDACTONE) 100 MG tablet TAKE 1 TABLET(100 MG) BY MOUTH TWICE DAILY   No facility-administered encounter medications on file as of 05/30/2018.     Activities of Daily Living In your present state of health, do you have any difficulty performing the following activities: 05/30/2018  Hearing? Y  Comment wears hearing aids both ears   Vision? N  Difficulty concentrating or making decisions? Y  Comment trouble remembering but writes down to help her remember   Walking or climbing stairs? N  Dressing or bathing? N  Doing errands, shopping? N  Preparing Food and eating ? N  Using the Toilet? N  In the past six months, have you accidently leaked urine? N  Do you have problems with loss of bowel control? N  Managing your Medications? N  Managing your Finances? N  Housekeeping or managing your Housekeeping? N  Some recent data might be hidden    Patient Care Team: Gayland Curry, DO as PCP - General (Geriatric Medicine) Berle Mull, MD as Consulting Physician (Family Medicine) Sharmon Revere as Physician Assistant (Cardiology) Calvert Cantor, MD as Consulting Physician (Ophthalmology) [name], MD as Attending Physician (Gastroenterology)    Assessment:   This is a routine wellness examination for [place].  Exercise Activities and Dietary recommendations Current Exercise Habits: The patient does not participate in regular exercise at present, Exercise limited by: Other - see comments(left sciatic nerve prevents her from standing/walk  too long  )  Goals    . <enter goal here>     Starting 04/19/16, I would like to maintain my current lifestyle.     . Patient Stated     Pt would like to work on portion control        Fall Risk Fall Risk  05/30/2018 01/29/2018 09/25/2017 05/29/2017 01/26/2017  Falls in the past year? 0 No Yes No No  Number falls in past yr: 0 - 1 - -  Injury with Fall? 0 - Yes - -   Is the patient's home free of loose throw rugs in walkways, pet beds, electrical cords, etc?   no      Grab bars in the bathroom? no      Handrails on the stairs?   yes      Adequate lighting?   yes  Depression Screen PHQ 2/9 Scores 05/30/2018 09/25/2017 05/29/2017 01/26/2017  PHQ - 2 Score 0 0 0 0     Cognitive Function MMSE - Mini Mental State Exam 05/30/2018 05/29/2017 04/19/2016  Orientation to time 5 5 5   Orientation to Place 5 5 5   Registration 3 3 3   Attention/ Calculation 5 5 5   Recall 2 3 3   Language- name 2 objects 2 2 2   Language- repeat 1 1 1   Language- follow 3 step command 3 3 2   Language- read & follow direction 1 1 1   Write a sentence 1 1 1   Copy design 1 1 1   Total score 29 30 29     Immunization History  Administered Date(s) Administered  . Hep A / Hep B 06/24/2016, 07/04/2016, 07/21/2016  . Influenza Whole 02/28/2012  . Influenza, High Dose Seasonal PF 01/26/2017, 01/29/2018  . Influenza,inj,Quad PF,6+ Mos 03/27/2013, 01/16/2014, 03/31/2015, 02/26/2016  . Pneumococcal Conjugate-13 06/28/2011  . Pneumococcal Polysaccharide-23 04/19/2016  . Td 01/12/1999    Qualifies for Shingles Vaccine? Decline   Screening Tests Health Maintenance  Topic Date Due  . FOOT EXAM  05/29/2018  . OPHTHALMOLOGY EXAM  08/24/2018  . HEMOGLOBIN A1C  11/22/2018  . URINE MICROALBUMIN  01/30/2019  . MAMMOGRAM  09/15/2019  . COLONOSCOPY  07/07/2021  . INFLUENZA VACCINE  Completed  . DEXA SCAN  Completed  . Hepatitis C Screening  Completed  . PNA vac Low Risk Adult  Completed  . TETANUS/TDAP  Discontinued    Cancer  Screenings: Lung: Low Dose CT Chest recommended if Age 25-80 years, 30 pack-year currently smoking OR have quit w/in 15years. Patient does not qualify. Breast:  Up to date on Mammogram? Up to date    Up to date of Bone Density/Dexa? Up to date  Colorectal: up to date due 2023  Additional Screenings: Hepatitis C Screening: Declined      Plan:     - Dietary and life style changes  I have personally reviewed and noted the following in the patient's chart:   . Medical and social history . Use of alcohol, tobacco or illicit drugs  . Current medications and supplements . Functional ability and status . Nutritional status . Physical activity . Advanced directives . List of other physicians . Hospitalizations, surgeries, and ER visits in previous 12 months . Vitals . Screenings to include cognitive, depression, and falls . Referrals and appointments  In addition, I have reviewed and discussed with patient certain preventive protocols, quality metrics, and best practice recommendations. A written personalized care plan for preventive services as well as general preventive health recommendations were provided to patient.  Sandrea Hughs, NP  05/30/2018

## 2018-05-30 NOTE — Patient Instructions (Signed)
[name] , Thank you for taking time to come for your Medicare Wellness Visit. I appreciate your ongoing commitment to your health goals. Please review the following plan we discussed and let me know if I can assist you in the future.   Screening recommendations/referrals: Colonoscopy: up to date  Mammogram: Due 2020  Bone Density: up to date  Recommended yearly ophthalmology/optometry visit for glaucoma screening and checkup Recommended yearly dental visit for hygiene and checkup  Vaccinations: Influenza vaccine: up date Pneumococcal vaccine: Up to date  Tdap vaccine: up to date  Shingles vaccine:  Declined   Advanced directives: Has information will provide copy when filled out   Conditions/risks identified:Type 2 DM,HTN,Hyperlipidemia.Dietary and life style changes discussed.   Next appointment:1 year   Preventive Care 1 Years and Older, Female Preventive care refers to lifestyle choices and visits with your health care provider that can promote health and wellness. What does preventive care include?  A yearly physical exam. This is also called an annual well check.  Dental exams once or twice a year.  Routine eye exams. Ask your health care provider how often you should have your eyes checked.  Personal lifestyle choices, including:  Daily care of your teeth and gums.  Regular physical activity.  Eating a healthy diet.  Avoiding tobacco and drug use.  Limiting alcohol use.  Practicing safe sex.  Taking low-dose aspirin every day.  Taking vitamin and mineral supplements as recommended by your health care provider. What happens during an annual well check? The services and screenings done by your health care provider during your annual well check will depend on your age, overall health, lifestyle risk factors, and family history of disease. Counseling  Your health care provider may ask you questions about your:  Alcohol use.  Tobacco use.  Drug  use.  Emotional well-being.  Home and relationship well-being.  Sexual activity.  Eating habits.  History of falls.  Memory and ability to understand (cognition).  Work and work Statistician.  Reproductive health. Screening  You may have the following tests or measurements:  Height, weight, and BMI.  Blood pressure.  Lipid and cholesterol levels. These may be checked every 5 years, or more frequently if you are over 105 years old.  Skin check.  Lung cancer screening. You may have this screening every year starting at age 80 if you have a 30-pack-year history of smoking and currently smoke or have quit within the past 15 years.  Fecal occult blood test (FOBT) of the stool. You may have this test every year starting at age 85.  Flexible sigmoidoscopy or colonoscopy. You may have a sigmoidoscopy every 5 years or a colonoscopy every 10 years starting at age 81.  Hepatitis C blood test.  Hepatitis B blood test.  Sexually transmitted disease (STD) testing.  Diabetes screening. This is done by checking your blood sugar (glucose) after you have not eaten for a while (fasting). You may have this done every 1-3 years.  Bone density scan. This is done to screen for osteoporosis. You may have this done starting at age 7.  Mammogram. This may be done every 1-2 years. Talk to your health care provider about how often you should have regular mammograms. Talk with your health care provider about your test results, treatment options, and if necessary, the need for more tests. Vaccines  Your health care provider may recommend certain vaccines, such as:  Influenza vaccine. This is recommended every year.  Tetanus, diphtheria, and acellular  pertussis (Tdap, Td) vaccine. You may need a Td booster every 10 years.  Zoster vaccine. You may need this after age 90.  Pneumococcal 13-valent conjugate (PCV13) vaccine. One dose is recommended after age 84.  Pneumococcal polysaccharide  (PPSV23) vaccine. One dose is recommended after age 70. Talk to your health care provider about which screenings and vaccines you need and how often you need them. This information is not intended to replace advice given to you by your health care provider. Make sure you discuss any questions you have with your health care provider. Document Released: 05/22/2015 Document Revised: 01/13/2016 Document Reviewed: 02/24/2015 Elsevier Interactive Patient Education  2017 [place] Prevention in the Home Falls can cause injuries. They can happen to people of all ages. There are many things you can do to make your home safe and to help prevent falls. What can I do on the outside of my home?  Regularly fix the edges of walkways and driveways and fix any cracks.  Remove anything that might make you trip as you walk through a door, such as a raised step or threshold.  Trim any bushes or trees on the path to your home.  Use bright outdoor lighting.  Clear any walking paths of anything that might make someone trip, such as rocks or tools.  Regularly check to see if handrails are loose or broken. Make sure that both sides of any steps have handrails.  Any raised decks and porches should have guardrails on the edges.  Have any leaves, snow, or ice cleared regularly.  Use sand or salt on walking paths during winter.  Clean up any spills in your garage right away. This includes oil or grease spills. What can I do in the bathroom?  Use night lights.  Install grab bars by the toilet and in the tub and shower. Do not use towel bars as grab bars.  Use non-skid mats or decals in the tub or shower.  If you need to sit down in the shower, use a plastic, non-slip stool.  Keep the floor dry. Clean up any water that spills on the floor as soon as it happens.  Remove soap buildup in the tub or shower regularly.  Attach bath mats securely with double-sided non-slip rug tape.  Do not have  throw rugs and other things on the floor that can make you trip. What can I do in the bedroom?  Use night lights.  Make sure that you have a light by your bed that is easy to reach.  Do not use any sheets or blankets that are too big for your bed. They should not hang down onto the floor.  Have a firm chair that has side arms. You can use this for support while you get dressed.  Do not have throw rugs and other things on the floor that can make you trip. What can I do in the kitchen?  Clean up any spills right away.  Avoid walking on wet floors.  Keep items that you use a lot in easy-to-reach places.  If you need to reach something above you, use a strong step stool that has a grab bar.  Keep electrical cords out of the way.  Do not use floor polish or wax that makes floors slippery. If you must use wax, use non-skid floor wax.  Do not have throw rugs and other things on the floor that can make you trip. What can I do with my stairs?  Do not leave any items on the stairs.  Make sure that there are handrails on both sides of the stairs and use them. Fix handrails that are broken or loose. Make sure that handrails are as long as the stairways.  Check any carpeting to make sure that it is firmly attached to the stairs. Fix any carpet that is loose or worn.  Avoid having throw rugs at the top or bottom of the stairs. If you do have throw rugs, attach them to the floor with carpet tape.  Make sure that you have a light switch at the top of the stairs and the bottom of the stairs. If you do not have them, ask someone to add them for you. What else can I do to help prevent falls?  Wear shoes that:  Do not have high heels.  Have rubber bottoms.  Are comfortable and fit you well.  Are closed at the toe. Do not wear sandals.  If you use a stepladder:  Make sure that it is fully opened. Do not climb a closed stepladder.  Make sure that both sides of the stepladder are  locked into place.  Ask someone to hold it for you, if possible.  Clearly mark and make sure that you can see:  Any grab bars or handrails.  First and last steps.  Where the edge of each step is.  Use tools that help you move around (mobility aids) if they are needed. These include:  Canes.  Walkers.  Scooters.  Crutches.  Turn on the lights when you go into a dark area. Replace any light bulbs as soon as they burn out.  Set up your furniture so you have a clear path. Avoid moving your furniture around.  If any of your floors are uneven, fix them.  If there are any pets around you, be aware of where they are.  Review your medicines with your doctor. Some medicines can make you feel dizzy. This can increase your chance of falling. Ask your doctor what other things that you can do to help prevent falls. This information is not intended to replace advice given to you by your health care provider. Make sure you discuss any questions you have with your health care provider. Document Released: 02/19/2009 Document Revised: 10/01/2015 Document Reviewed: 05/30/2014 Elsevier Interactive Patient Education  2017 [employer].

## 2018-06-04 ENCOUNTER — Ambulatory Visit (INDEPENDENT_AMBULATORY_CARE_PROVIDER_SITE_OTHER): Payer: Medicare Other | Admitting: Internal Medicine

## 2018-06-04 ENCOUNTER — Encounter: Payer: Self-pay | Admitting: Internal Medicine

## 2018-06-04 VITALS — BP 118/60 | HR 48 | Temp 98.0°F | Ht 63.0 in | Wt 147.0 lb

## 2018-06-04 DIAGNOSIS — K7460 Unspecified cirrhosis of liver: Secondary | ICD-10-CM

## 2018-06-04 DIAGNOSIS — I2510 Atherosclerotic heart disease of native coronary artery without angina pectoris: Secondary | ICD-10-CM | POA: Diagnosis not present

## 2018-06-04 DIAGNOSIS — M79652 Pain in left thigh: Secondary | ICD-10-CM | POA: Diagnosis not present

## 2018-06-04 DIAGNOSIS — E1165 Type 2 diabetes mellitus with hyperglycemia: Secondary | ICD-10-CM

## 2018-06-04 MED ORDER — GABAPENTIN 100 MG PO CAPS: 100.0000 mg | ORAL_CAPSULE | Freq: Every day | ORAL | 0 refills | Status: DC

## 2018-06-04 NOTE — Patient Instructions (Addendum)
Start a bedtime snack like peanut butter crackers before you take your levemir.   Increase your mealtime insulin to 14 units at breakfast and lunch and 12 units with supper  Try gabapentin 173m daily for the left thigh pain.

## 2018-06-04 NOTE — Progress Notes (Signed)
Location:  [facility] Provider:  Haniya Fern L. Mariea Clonts, D.O., C.M.D.  Goals of Care:  Advanced Directives 05/30/2018  Does Patient Have a Medical Advance Directive? No  Does patient want to make changes to medical advance directive? -  Would patient like information on creating a medical advance directive? Yes (ED - Information included in AVS)     Chief Complaint  Patient presents with  . Medical Management of Chronic Issues    69mh follow-up    HPI: Patient is a 73y.o. female seen today for medical management of chronic diseases.    She is feeling ok.   Sugar is highest after breakfast which is yogurt with fruit or cereal. Also will be high, but not so bad after lunch.  Today 200stg at lunch.  Mornings are low--64 this morning--happens pretty much.  Eats breakfast whenever she wants like 9/9:30am which is when she checks her sugar. Very seldom high in the morning.  Her husband says she eats like a bird.  Lunch was apple and a ham and cheese rollup.  They eat a regular meal for supper.  Does not do a bedtime snack.  They eat supper by 5pm.  Takes levemir in evening--at 10pm.   hba1c down to 9.2 from 9.7.  Says things are alright outside of her sciatica bothering her.  Can't stand or walk long as a result.  Dr. BOrpah Melterin with Dr. JRonnald Rampgave her a shot last July that did not do anything.    Past Medical History:  Diagnosis Date  . Allergy   . Arthritis    neck  . Cataract    bilateral - MD monitoring cataracts  . CHF (congestive heart failure) ([place]   . Chronic kidney disease, stage I    DR OTTELIN  HX UTIS  . Cirrhosis ([place]   . Cramp of limb   . Diabetes mellitus   . Dysphagia, unspecified(787.20)   . Dysuria   . Epistaxis   . GERD (gastroesophageal reflux disease)   . Heart murmur    NO CARDIOLOGIST  DX FOR YEARS ASYMPTOMATIC  . Lumbago   . Neoplasm of uncertain behavior of skin   . Nonspecific elevation of levels of transaminase or lactic acid dehydrogenase (LDH)   .  Osteoarthrosis, unspecified whether generalized or localized, unspecified site   . Other and unspecified hyperlipidemia    diet controlled  . Pain in joint, shoulder region   . Paresthesias 04/01/2015  . Postablative ovarian failure   . Trochanteric bursitis of left hip 12/15/2015  . Type 2 diabetes mellitus without complication ([place]   . Unspecified essential hypertension    no meds    Past Surgical History:  Procedure Laterality Date  . BREAST BIOPSY    . CARDIAC CATHETERIZATION N/A 01/27/2016   Procedure: Left Heart Cath and Coronary Angiography;  Surgeon: HBelva Crome MD;  [facility];  Service: Cardiovascular;  Laterality: N/A;  . COLONOSCOPY  2012   Jacobs - polyp  . COLONOSCOPY WITH PROPOFOL N/A 07/07/2016   Procedure: COLONOSCOPY WITH PROPOFOL;  Surgeon: DMilus Banister MD;  [facility];  Service: Endoscopy;  Laterality: N/A;  . CORONARY ARTERY BYPASS GRAFT N/A 01/28/2016   Procedure: CORONARY ARTERY BYPASS GRAFTING (CABG) x 3 USING RIGHT LEG GREATER SAPHENOUS VEIN GRAFT;  Surgeon: SMelrose Nakayama MD;  [place] [facility]  Service: Open Heart Surgery;  Laterality: N/A;  . ENDOVEIN HARVEST OF GREATER SAPHENOUS VEIN Right 01/28/2016   Procedure:  ENDOVEIN HARVEST OF GREATER SAPHENOUS VEIN;  Surgeon: Melrose Nakayama, MD;  Location: [place];  Service: Open Heart Surgery;  Laterality: Right;  . ESOPHAGOGASTRODUODENOSCOPY (EGD) WITH PROPOFOL N/A 07/07/2016   Procedure: ESOPHAGOGASTRODUODENOSCOPY (EGD) WITH PROPOFOL;  Surgeon: [name], MD;  [facility];  Service: Endoscopy;  Laterality: N/A;  . MAXIMUM ACCESS (MAS)POSTERIOR LUMBAR INTERBODY FUSION (PLIF) 1 LEVEL Left 06/10/2015   Procedure: FOR MAXIMUM ACCESS (MAS) POSTERIOR LUMBAR INTERBODY FUSION (PLIF) LUMBAR THREE-FOUR EXTRAFORAMINAL MICRODISCECTOMY LUMBAR FIVE-SACRAL ONE LEFT;  Surgeon: Eustace Moore, MD;  Location: [place] [facility];  Service: Neurosurgery;  Laterality: Left;  . TEE WITHOUT  CARDIOVERSION N/A 01/28/2016   Procedure: TRANSESOPHAGEAL ECHOCARDIOGRAM (TEE);  Surgeon: Melrose Nakayama, MD;  Location: [place];  Service: Open Heart Surgery;  Laterality: N/A;  . TUBAL LIGATION  1982   Dr Connye Burkitt  . UPPER GASTROINTESTINAL ENDOSCOPY    . VAGINAL HYSTERECTOMY  1997   Dr Rande Lawman    Allergies  Allergen Reactions  . Kiwi Extract Anaphylaxis  . Tdap [Tetanus-Diphth-Acell Pertussis] Swelling and Other (See Comments)    Swelling at injection site, gets very hot  . Statins     RHABDOMYOLYSIS  . Latex Itching, Dermatitis and Rash  . Tramadol Nausea And Vomiting    Outpatient Encounter Medications as of 06/04/2018  Medication Sig  . acetaminophen (TYLENOL) 500 MG tablet Take 500 mg by mouth at bedtime as needed for mild pain.  . Aromatic Inhalants (VICKS VAPOR IN) Vicks Vapor Rub apply small amount to outside of nose to help breathing  . aspirin EC 81 MG tablet Take 1 tablet (81 mg total) by mouth daily.  . BD PEN NEEDLE NANO U/F 32G X 4 MM MISC USE THREE TIMES DAILY AS DIRECTED  . Biotin 10000 MCG TABS Take 10,000 mcg by mouth every morning.  . calcium carbonate (OS-CAL) 600 MG TABS Take 600 mg by mouth 2 (two) times daily with a meal.    . cholecalciferol (VITAMIN D) 1000 UNITS tablet Take 2,000 Units by mouth daily.   . Cyanocobalamin (VITAMIN B 12 PO) Take 1,000 mcg by mouth daily.    [name] Kitchen ezetimibe (ZETIA) 10 MG tablet TAKE 1 TABLET(10 MG) BY MOUTH DAILY  . Flaxseed, Linseed, (FLAXSEED OIL PO) Take 1 tablet by mouth daily.  . furosemide (LASIX) 40 MG tablet TAKE 1 TABLET(40 MG) BY MOUTH DAILY  . glucose blood test strip One Touch Ultra II strips. Use to test blood sugar three times daily. Dx: E11.65  . insulin aspart (NOVOLOG FLEXPEN) 100 UNIT/ML FlexPen Inject 12 Units into the skin 3 (three) times daily with meals.  . Insulin Syringe-Needle U-100 (INSULIN SYRINGE 1CC/30GX5/16") 30G X 5/16" 1 ML MISC USE DAILY WITH LEVEMIR INJECTION  . LEVEMIR 100 UNIT/ML injection  ADMINISTER 55 UNITS UNDER THE SKIN EVERY DAY  . MAGNESIUM PO Take 500 mg by mouth 2 (two) times daily in the am and at bedtime..  . Multiple Vitamins-Minerals (MULTIVITAMIN WITH MINERALS) tablet Take 1 tablet by mouth daily.    . nadolol (CORGARD) 40 MG tablet Take 1 tablet (40 mg total) by mouth daily.  [name] Kitchen omeprazole (PRILOSEC) 20 MG capsule TAKE 1 CAPSULE BY MOUTH DAILY  . Polyethyl Glycol-Propyl Glycol (SYSTANE OP) Place 1 drop into both eyes 2 (two) times daily.  . potassium chloride SA (K-DUR,KLOR-CON) 20 MEQ tablet TAKE 1 TABLET BY MOUTH EVERY DAY  . Probiotic Product (PROBIOTIC DAILY PO) Take 1 capsule by mouth daily. Digestive Advantage Probiotic  . spironolactone (  ALDACTONE) 100 MG tablet Take 100 mg by mouth daily.   No facility-administered encounter medications on file as of 06/04/2018.     Review of Systems:  Review of Systems  Constitutional: Negative for chills, fever and malaise/fatigue.  HENT: Positive for hearing loss.   Eyes: Negative for blurred vision.  Respiratory: Negative for cough and shortness of breath.   Cardiovascular: Negative for chest pain, palpitations and leg swelling.  Gastrointestinal: Negative for abdominal pain.       NASH with cirrhosis  Genitourinary: Negative for dysuria.  Musculoskeletal: Positive for back pain. Negative for falls.       Only if she gets really tired; but left thigh bothers her and then gets tingling pain into left lower leg, hip hurts, too  Skin: Negative for rash.  Neurological: Negative for loss of consciousness and headaches.  Endo/Heme/Allergies: Bruises/bleeds easily.  Psychiatric/Behavioral: Negative for depression and memory loss. The patient does not have insomnia.     Health Maintenance  Topic Date Due  . FOOT EXAM  05/29/2018  . OPHTHALMOLOGY EXAM  08/24/2018  . HEMOGLOBIN A1C  11/22/2018  . URINE MICROALBUMIN  01/30/2019  . MAMMOGRAM  09/15/2019  . COLONOSCOPY  07/07/2021  . INFLUENZA VACCINE  Completed  .  DEXA SCAN  Completed  . Hepatitis C Screening  Completed  . PNA vac Low Risk Adult  Completed  . TETANUS/TDAP  Discontinued    Physical Exam: Vitals:   06/04/18 1518  BP: 118/60  Pulse: (!) 48  Temp: 98 F (36.7 C)  TempSrc: Oral  SpO2: 98%  Weight: 147 lb (66.7 kg)  Height: 5' 3"  (1.6 m)   Body mass index is 26.04 kg/m. Physical Exam Constitutional:      Appearance: She is ill-appearing. She is not diaphoretic.  HENT:     Head: Normocephalic and atraumatic.  Eyes:     Pupils: Pupils are equal, round, and reactive to light.  Cardiovascular:     Rate and Rhythm: Normal rate and regular rhythm.     Pulses: Normal pulses.     Heart sounds: Normal heart sounds.  Pulmonary:     Effort: Pulmonary effort is normal.     Breath sounds: Normal breath sounds.  Abdominal:     General: Bowel sounds are normal.  Musculoskeletal: Normal range of motion.  Skin:    General: Skin is warm and dry.     Capillary Refill: Capillary refill takes less than 2 seconds.  Neurological:     General: No focal deficit present.     Mental Status: She is alert and oriented to person, place, and time.  Psychiatric:        Mood and Affect: Mood normal.     Labs reviewed: Basic Metabolic Panel: Recent Labs    01/25/18 0935 04/25/18 1453 05/24/18 0857  NA 139 141 139  K 4.7 3.9 4.1  CL 103 103 104  CO2 28 31 29   GLUCOSE 90 219* 85  BUN 16 21 23   CREATININE 1.11* 1.26* 1.05*  CALCIUM 9.4 10.4 9.7   Liver Function Tests: Recent Labs    08/14/17 1616  01/25/18 0935 04/25/18 1453 05/24/18 0857  AST 60*   < > 70* 76* 50*  ALT 54*   < > 78* 80* 53*  ALKPHOS 117  --   --  107  --   BILITOT 0.8   < > 1.0 0.8 1.2  PROT 7.7   < > 7.0 7.0 7.0  ALBUMIN 3.5  --   --  3.4*  --    < > = values in this interval not displayed.   No results for input(s): LIPASE, AMYLASE in the last 8760 hours. No results for input(s): AMMONIA in the last 8760 hours. CBC: Recent Labs    08/14/17 1616  04/25/18 1453  WBC 7.0 5.0  NEUTROABS 3.8 2.8  HGB 13.4 14.1  HCT 40.5 42.2  MCV 88.7 95.3  PLT 134.0* 101.0*   Lipid Panel: Recent Labs    01/25/18 0935 05/24/18 0857  CHOL 155 147  HDL 58 53  LDLCALC 82 80  TRIG 70 64  CHOLHDL 2.7 2.8   Lab Results  Component Value Date   HGBA1C 9.2 (H) 05/24/2018     Assessment/Plan 1. Left thigh pain -has been labeled as sciatica, but sounds like it may be more of a radicular pain from her back vs a bursitis--not clear--presentation odd--will try neuropathic pain med due to burning into left lower leg aspect - gabapentin (NEURONTIN) 100 MG capsule; Take 1 capsule (100 mg total) by mouth daily.  Dispense: 30 capsule; Refill: 0  2. Cirrhosis of liver without ascites, unspecified hepatic cirrhosis type ([place]) -ongoing, keep regular f/us with GI  3. Uncontrolled type 2 diabetes mellitus with hyperglycemia ([place]) - Control improved but remains above goal -increase mealtime insulin to 14/14/12 and keep levemir the same -add hs snack due to am lows - Hemoglobin A1c; Future - CBC with Differential/Platelet; Future - BASIC METABOLIC PANEL WITH GFR; Future  Labs/tests ordered:   Orders Placed This Encounter  Procedures  . Hemoglobin A1c    Standing Status:   Future    Standing Expiration Date:   06/05/2019  . CBC with Differential/Platelet    Standing Status:   Future    Standing Expiration Date:   06/05/2019  . BASIC METABOLIC PANEL WITH GFR    Standing Status:   Future    Standing Expiration Date:   06/05/2019    Next appt:  10/04/2018 med mgt, fasting labs before   Elier Zellars L. Dalary Hollar, D.O. [facility] [address], [place] [address] Cell Phone (Mon-Fri 8am-5pm):  [phone] On Call:  [phone] & follow prompts after 5pm & weekends Office Phone:  [phone] Office Fax:  [phone]

## 2018-06-07 ENCOUNTER — Other Ambulatory Visit: Payer: Self-pay | Admitting: Internal Medicine

## 2018-06-07 DIAGNOSIS — E876 Hypokalemia: Secondary | ICD-10-CM

## 2018-06-19 ENCOUNTER — Other Ambulatory Visit: Payer: Self-pay | Admitting: Internal Medicine

## 2018-06-19 DIAGNOSIS — E785 Hyperlipidemia, unspecified: Principal | ICD-10-CM

## 2018-06-19 DIAGNOSIS — E1169 Type 2 diabetes mellitus with other specified complication: Secondary | ICD-10-CM

## 2018-06-21 ENCOUNTER — Other Ambulatory Visit: Payer: Self-pay | Admitting: Internal Medicine

## 2018-07-03 ENCOUNTER — Encounter: Payer: Self-pay | Admitting: Internal Medicine

## 2018-07-03 NOTE — Progress Notes (Signed)
A user error has taken place This encounter was created in error - please disregard.

## 2018-07-06 NOTE — Progress Notes (Signed)
Cardiology Office Note:    Date:  07/10/2018   ID:  [name], DOB June 10, 1945, MRN [id number]  PCP:  Gayland Curry, DO  Cardiologist:  Dr. [name]   Electrophysiologist:  n/a GI:  Dr. [name]  Referring MD: Gayland Curry, DO   No chief complaint on file.   History of Present Illness:    73 y.o. CRF;s HTN, HLD, DM-2 Had SEMI September 2017 followed by CABG EF 45-50% Surgery Dr Roxan Hockey included LIMA-LAD, SVG- IM, SVG -PDA. Post op developed ascites And cirrhosis . Seen by Dr [name] 03/09/16 paleced on aldactone and has had multiple paracentesis.   She denies syncope, orthopnea, PND, edema. She denies recurrent abdominal distention.   No chest pain BP low at times discussed hold\ing corgard or diuretics at those times  Prior CV and other studies that were reviewed today include:    Echo 03/08/16 Mild concentric LVH, EF 60-65, normal wall motion, grade 1 diastolic dysfunction, very mild aortic stenosis suggested by gradients (mean 12), mild AI, MAC, mild MR, mild to moderate LAE, PASP 38, left pleural effusion  Abdominal CT 03/06/16 IMPRESSION: Cirrhosis.  No focal hepatic lesion is seen. Moderate to large abdominopelvic ascites. Gastroesophageal and perigastric varices.  Portal vein is patent.  Carotid US 9/17 Bilateral ICA 1-39  ABIs 9/17 Normal  LHC 9/17 Acute coronary syndrome with recent total occlusion of the dominant RCA, high-grade obstruction in the proximal LAD which Collateralizes the RCA, and high-grade obstruction and a moderate size ramus intermedius branch. Left ventricular systolic dysfunction with inferobasal moderate hypokinesis and EF of 45-50%. Upper normal LV EDP.   Past Medical History:  Diagnosis Date  . Allergy   . Arthritis    neck  . Cataract    bilateral - MD monitoring cataracts  . CHF (congestive heart failure) ([place])   . Chronic kidney disease, stage I    DR OTTELIN  HX UTIS  . Cirrhosis ([place])   . Cramp of limb   .  Diabetes mellitus   . Dysphagia, unspecified(787.20)   . Dysuria   . Epistaxis   . GERD (gastroesophageal reflux disease)   . Heart murmur    NO CARDIOLOGIST  DX FOR YEARS ASYMPTOMATIC  . Lumbago   . Neoplasm of uncertain behavior of skin   . Nonspecific elevation of levels of transaminase or lactic acid dehydrogenase (LDH)   . Osteoarthrosis, unspecified whether generalized or localized, unspecified site   . Other and unspecified hyperlipidemia    diet controlled  . Pain in joint, shoulder region   . Paresthesias 04/01/2015  . Postablative ovarian failure   . Trochanteric bursitis of left hip 12/15/2015  . Type 2 diabetes mellitus without complication ([place])   . Unspecified essential hypertension    no meds    Past Surgical History:  Procedure Laterality Date  . BREAST BIOPSY    . CARDIAC CATHETERIZATION N/A 01/27/2016   Procedure: Left Heart Cath and Coronary Angiography;  Surgeon: Belva Crome, MD;  Location: [place] [facility];  Service: Cardiovascular;  Laterality: N/A;  . COLONOSCOPY  2012   Jacobs - polyp  . COLONOSCOPY WITH PROPOFOL N/A 07/07/2016   Procedure: COLONOSCOPY WITH PROPOFOL;  Surgeon: [name], MD;  [facility];  Service: Endoscopy;  Laterality: N/A;  . CORONARY ARTERY BYPASS GRAFT N/A 01/28/2016   Procedure: CORONARY ARTERY BYPASS GRAFTING (CABG) x 3 USING RIGHT LEG GREATER SAPHENOUS VEIN GRAFT;  Surgeon: Melrose Nakayama, MD;  Location: [place];  Service: Open Heart Surgery;  Laterality: N/A;  . ENDOVEIN HARVEST OF GREATER SAPHENOUS VEIN Right 01/28/2016   Procedure: ENDOVEIN HARVEST OF GREATER SAPHENOUS VEIN;  Surgeon: Melrose Nakayama, MD;  Location: [place];  Service: Open Heart Surgery;  Laterality: Right;  . ESOPHAGOGASTRODUODENOSCOPY (EGD) WITH PROPOFOL N/A 07/07/2016   Procedure: ESOPHAGOGASTRODUODENOSCOPY (EGD) WITH PROPOFOL;  Surgeon: [name], MD;  [facility];  Service: Endoscopy;  Laterality: N/A;  . MAXIMUM ACCESS  (MAS)POSTERIOR LUMBAR INTERBODY FUSION (PLIF) 1 LEVEL Left 06/10/2015   Procedure: FOR MAXIMUM ACCESS (MAS) POSTERIOR LUMBAR INTERBODY FUSION (PLIF) LUMBAR THREE-FOUR EXTRAFORAMINAL MICRODISCECTOMY LUMBAR FIVE-SACRAL ONE LEFT;  Surgeon: Eustace Moore, MD;  Location: [place] [facility];  Service: Neurosurgery;  Laterality: Left;  . TEE WITHOUT CARDIOVERSION N/A 01/28/2016   Procedure: TRANSESOPHAGEAL ECHOCARDIOGRAM (TEE);  Surgeon: Melrose Nakayama, MD;  Location: [place];  Service: Open Heart Surgery;  Laterality: N/A;  . TUBAL LIGATION  1982   Dr Connye Burkitt  . UPPER GASTROINTESTINAL ENDOSCOPY    . VAGINAL HYSTERECTOMY  1997   Dr Rande Lawman    Current Medications: Current Meds  Medication Sig  . acetaminophen (TYLENOL) 500 MG tablet Take 500 mg by mouth at bedtime as needed for mild pain.  . Aromatic Inhalants (VICKS VAPOR IN) Vicks Vapor Rub apply small amount to outside of nose to help breathing  . aspirin EC 81 MG tablet Take 1 tablet (81 mg total) by mouth daily.  . BD PEN NEEDLE NANO U/F 32G X 4 MM MISC USE THREE TIMES DAILY AS DIRECTED  . Biotin 10000 MCG TABS Take 10,000 mcg by mouth every morning.  . calcium carbonate (OS-CAL) 600 MG TABS Take 600 mg by mouth 2 (two) times daily with a meal.    . cephALEXin (KEFLEX) 500 MG capsule Take 1 capsule by mouth 2 (two) times daily.  . cholecalciferol (VITAMIN D) 1000 UNITS tablet Take 2,000 Units by mouth daily.   . Cyanocobalamin (VITAMIN B 12 PO) Take 1,000 mcg by mouth daily.    [name] Kitchen ezetimibe (ZETIA) 10 MG tablet TAKE 1 TABLET(10 MG) BY MOUTH DAILY  . Flaxseed, Linseed, (FLAXSEED OIL PO) Take 1 tablet by mouth daily.  . furosemide (LASIX) 40 MG tablet TAKE 1 TABLET(40 MG) BY MOUTH DAILY  . gabapentin (NEURONTIN) 100 MG capsule Take 1 capsule (100 mg total) by mouth daily.  [name] Kitchen glucose blood test strip One Touch Ultra II strips. Use to test blood sugar three times daily. Dx: E11.65  . insulin aspart (NOVOLOG FLEXPEN) 100 UNIT/ML FlexPen 14 units with  breakfast and lunch, 12 units with supper  . Insulin Syringe-Needle U-100 (INSULIN SYRINGE 1CC/30GX5/16") 30G X 5/16" 1 ML MISC USE DAILY WITH LEVEMIR INJECTION  . LEVEMIR 100 UNIT/ML injection ADMINISTER 55 UNITS UNDER THE SKIN EVERY DAY  . MAGNESIUM PO Take 500 mg by mouth 2 (two) times daily in the am and at bedtime..  . Multiple Vitamins-Minerals (MULTIVITAMIN WITH MINERALS) tablet Take 1 tablet by mouth daily.    . nadolol (CORGARD) 40 MG tablet Take 1 tablet (40 mg total) by mouth daily.  [name] Kitchen omeprazole (PRILOSEC) 20 MG capsule TAKE 1 CAPSULE BY MOUTH DAILY  . Polyethyl Glycol-Propyl Glycol (SYSTANE OP) Place 1 drop into both eyes 2 (two) times daily.  . potassium chloride SA (K-DUR,KLOR-CON) 20 MEQ tablet TAKE 1 TABLET BY MOUTH EVERY DAY  . Probiotic Product (PROBIOTIC DAILY PO) Take 1 capsule by mouth daily. Digestive Advantage Probiotic  . spironolactone (ALDACTONE) 100 MG tablet Take  100 mg by mouth daily.     Allergies:   Kiwi extract; Tdap [tetanus-diphth-acell pertussis]; Statins; Latex; and Tramadol   Social History   Socioeconomic History  . Marital status: Married    Spouse name: Not on file  . Number of children: 6  . Years of education: Not on file  . Highest education level: Not on file  Occupational History  . Occupation: retired  Scientific laboratory technician  . Financial resource strain: Not hard at all  . Food insecurity:    Worry: Never true    Inability: Never true  . Transportation needs:    Medical: No    Non-medical: No  Tobacco Use  . Smoking status: Never Smoker  . Smokeless tobacco: Never Used  Substance and Sexual Activity  . Alcohol use: No  . Drug use: No  . Sexual activity: Yes    Partners: Male    Birth control/protection: Post-menopausal, Surgical    Comment: Hysterectomy  Lifestyle  . Physical activity:    Days per week: 0 days    Minutes per session: 0 min  . Stress: Not at all  Relationships  . Social connections:    Talks on phone: More than  three times a week    Gets together: Twice a week    Attends religious service: More than 4 times per year    Active member of club or organization: No    Attends meetings of clubs or organizations: Never    Relationship status: Married  Other Topics Concern  . Not on file  Social History Narrative  . Not on file     Family History:  The patient's family history includes Arthritis in her brother and sister; Breast cancer in her maternal aunt, mother, and paternal aunt; Heart disease in her maternal grandfather, maternal grandmother, paternal grandfather, and paternal grandmother; Liver cancer in her brother; Lung cancer in her father.   ROS:   Please see the history of present illness.    Review of Systems  Constitution: Positive for chills.  Respiratory: Positive for cough.   Neurological: Positive for dizziness.  Psychiatric/Behavioral: The patient is nervous/anxious.    All other systems reviewed and are negative.   EKGs/Labs/Other Test Reviewed:    EKG:   07/10/18 SR rate 56 nonspecific ST changes   Recent Labs: 04/25/2018: Hemoglobin 14.1; Platelets 101.0 05/24/2018: ALT 53; BUN 23; Creat 1.05; Potassium 4.1; Sodium 139   Recent Lipid Panel    Component Value Date/Time   CHOL 147 05/24/2018 0857   CHOL 185 07/08/2016 1045   TRIG 64 05/24/2018 0857   HDL 53 05/24/2018 0857   HDL 55 07/08/2016 1045   CHOLHDL 2.8 05/24/2018 0857   VLDL 13 04/15/2016 1002   LDLCALC 80 05/24/2018 0857     Physical Exam:    VS:  BP 92/60   Pulse (!) 56   Ht _0  (1.6 m)   Wt 68.5 kg   SpO2 96%   BMI 26.75 kg/m     Wt Readings from Last 3 Encounters:  07/10/18 68.5 kg  06/04/18 66.7 kg  05/30/18 67 kg    Affect appropriate Healthy:  appears stated age 19: normal Neck supple with no adenopathy JVP normal no bruits no thyromegaly Lungs clear with no wheezing and good diaphragmatic motion Heart:  S1/S2 no murmur, no rub, gallop or click PMI normal post sternotomy    Abdomen: benighn, BS positve, no tenderness, no AAA no bruit.  No HSM or HJR Distal  pulses intact with no bruits No edema Neuro non-focal Skin warm and dry No muscular weakness    ASSESSMENT:    1. Coronary artery disease involving coronary bypass graft of native heart without angina pectoris   2. Hyperlipidemia, unspecified hyperlipidemia type   3. Essential hypertension   4. PVC's (premature ventricular contractions)   5. S/P CABG x 3    PLAN:    In order of problems listed above:  1. CAD -  CABG in 9/17.  No angina continue ASA and beta blocker   2. Cirrhosis - Question if 2/2 to NASH.  She is followed by GI.  She is now on Lasix and Spironolactone.  She is s/p paracentesis. Beta blocker changed to nadolol for ascites and to prevent variceal bleeding   3. HL -  On zetia not statin due to cirrhosis labs with primary   4. HTN - Well controlled.  Continue current medications and low sodium Dash type diet.    5. PVCs - in setting normal EF post CABG continue corgard   6. Paresthesias - She notes bilateral thigh numbness.  She had lumbar back surgery with Dr. Ronnald Ramp in 2/17.  I have asked her to contact him with her symptoms.    [name], MD

## 2018-07-09 ENCOUNTER — Other Ambulatory Visit: Payer: Self-pay | Admitting: Internal Medicine

## 2018-07-09 DIAGNOSIS — Z794 Long term (current) use of insulin: Secondary | ICD-10-CM

## 2018-07-09 DIAGNOSIS — E1165 Type 2 diabetes mellitus with hyperglycemia: Secondary | ICD-10-CM

## 2018-07-10 ENCOUNTER — Encounter: Payer: Self-pay | Admitting: Cardiovascular Disease

## 2018-07-10 ENCOUNTER — Ambulatory Visit (INDEPENDENT_AMBULATORY_CARE_PROVIDER_SITE_OTHER): Payer: Medicare Other | Admitting: Cardiovascular Disease

## 2018-07-10 VITALS — BP 92/60 | HR 56 | Ht 63.0 in | Wt 151.0 lb

## 2018-07-10 DIAGNOSIS — I25810 Atherosclerosis of coronary artery bypass graft(s) without angina pectoris: Secondary | ICD-10-CM

## 2018-07-10 DIAGNOSIS — Z951 Presence of aortocoronary bypass graft: Secondary | ICD-10-CM

## 2018-07-10 DIAGNOSIS — E785 Hyperlipidemia, unspecified: Secondary | ICD-10-CM | POA: Diagnosis not present

## 2018-07-10 DIAGNOSIS — I493 Ventricular premature depolarization: Secondary | ICD-10-CM

## 2018-07-10 DIAGNOSIS — I10 Essential (primary) hypertension: Secondary | ICD-10-CM

## 2018-07-10 NOTE — Patient Instructions (Addendum)
Medication Instructions:   If you need a refill on your cardiac medications before your next appointment, please call your pharmacy.   Lab work:  If you have labs (blood work) drawn today and your tests are completely normal, you will receive your results only by: [name] Kitchen MyChart Message (if you have MyChart) OR . A paper copy in the mail If you have any lab test that is abnormal or we need to change your treatment, we will call you to review the results.  Testing/Procedures: None ordered today.  Follow-Up: At [facility], you and your health needs are our priority.  As part of our continuing mission to provide you with exceptional heart care, we have created designated Provider Care Teams.  These Care Teams include your primary Cardiologist (physician) and Advanced Practice Providers (APPs -  Physician Assistants and Nurse Practitioners) who all work together to provide you with the care you need, when you need it. You will need a follow up appointment in 12 months.  Please call our office 2 months in advance to schedule this appointment.  You may see Dr. Johnsie Cancel or one of the following Advanced Practice Providers on your designated Care Team:   Truitt Merle, NP Cecilie Kicks, NP . Kathyrn Drown, NP

## 2018-07-16 DIAGNOSIS — M48062 Spinal stenosis, lumbar region with neurogenic claudication: Secondary | ICD-10-CM | POA: Diagnosis not present

## 2018-07-16 DIAGNOSIS — M961 Postlaminectomy syndrome, not elsewhere classified: Secondary | ICD-10-CM | POA: Diagnosis not present

## 2018-07-16 DIAGNOSIS — E119 Type 2 diabetes mellitus without complications: Secondary | ICD-10-CM | POA: Diagnosis not present

## 2018-07-16 DIAGNOSIS — M9981 Other biomechanical lesions of cervical region: Secondary | ICD-10-CM | POA: Diagnosis not present

## 2018-07-16 DIAGNOSIS — M542 Cervicalgia: Secondary | ICD-10-CM | POA: Diagnosis not present

## 2018-07-23 ENCOUNTER — Other Ambulatory Visit: Payer: Self-pay | Admitting: Physician Assistant

## 2018-07-24 DIAGNOSIS — M48062 Spinal stenosis, lumbar region with neurogenic claudication: Secondary | ICD-10-CM | POA: Diagnosis not present

## 2018-07-27 ENCOUNTER — Other Ambulatory Visit: Payer: Self-pay | Admitting: Internal Medicine

## 2018-08-06 ENCOUNTER — Other Ambulatory Visit: Payer: Self-pay | Admitting: Internal Medicine

## 2018-08-06 DIAGNOSIS — Z1231 Encounter for screening mammogram for malignant neoplasm of breast: Secondary | ICD-10-CM

## 2018-09-08 ENCOUNTER — Other Ambulatory Visit: Payer: Self-pay | Admitting: Gastroenterology

## 2018-09-08 ENCOUNTER — Other Ambulatory Visit: Payer: Self-pay | Admitting: Internal Medicine

## 2018-09-08 DIAGNOSIS — E876 Hypokalemia: Secondary | ICD-10-CM

## 2018-09-10 NOTE — Telephone Encounter (Signed)
Patient is requesting refill for Potassium Chloride 20 meq tab. Very high allergy alert. Routing to provider to review.

## 2018-09-18 ENCOUNTER — Other Ambulatory Visit: Payer: Self-pay | Admitting: Internal Medicine

## 2018-09-18 DIAGNOSIS — Z794 Long term (current) use of insulin: Secondary | ICD-10-CM

## 2018-09-18 DIAGNOSIS — E1165 Type 2 diabetes mellitus with hyperglycemia: Secondary | ICD-10-CM

## 2018-09-26 ENCOUNTER — Ambulatory Visit
Admission: RE | Admit: 2018-09-26 | Discharge: 2018-09-26 | Disposition: A | Discharge status: Home / Self Care | Payer: Medicare Other | Source: Ambulatory Visit | Attending: Internal Medicine | Admitting: Internal Medicine

## 2018-09-26 ENCOUNTER — Other Ambulatory Visit: Payer: Self-pay

## 2018-09-26 DIAGNOSIS — Z1231 Encounter for screening mammogram for malignant neoplasm of breast: Secondary | ICD-10-CM

## 2018-10-02 ENCOUNTER — Other Ambulatory Visit: Payer: Self-pay

## 2018-10-02 ENCOUNTER — Other Ambulatory Visit: Payer: Medicare Other

## 2018-10-02 DIAGNOSIS — E1165 Type 2 diabetes mellitus with hyperglycemia: Secondary | ICD-10-CM | POA: Diagnosis not present

## 2018-10-03 LAB — BASIC METABOLIC PANEL WITH GFR
BUN/Creatinine Ratio: 21 (calc) (ref 6–22)
BUN: 22 mg/dL (ref 7–25)
CO2: 28 mmol/L (ref 20–32)
Calcium: 9.5 mg/dL (ref 8.6–10.4)
Chloride: 103 mmol/L (ref 98–110)
Creat: 1.06 mg/dL — ABNORMAL HIGH (ref 0.60–0.93)
GFR, Est African American: 61 mL/min/{1.73_m2} (ref >=60)
GFR, Est Non African American: 53 mL/min/{1.73_m2} — ABNORMAL LOW (ref >=60)
Glucose, Bld: 120 mg/dL — ABNORMAL HIGH (ref 65–99)
Potassium: 4.3 mmol/L (ref 3.5–5.3)
Sodium: 138 mmol/L (ref 135–146)

## 2018-10-03 LAB — HEMOGLOBIN A1C
Hgb A1c MFr Bld: 10 % of total Hgb — ABNORMAL HIGH (ref <5.7)
Mean Plasma Glucose: 240 (calc)
eAG (mmol/L): 13.3 (calc)

## 2018-10-03 LAB — CBC WITH DIFFERENTIAL/PLATELET
Absolute Monocytes: 866 cells/uL (ref 200–950)
Basophils Absolute: 49 cells/uL (ref 0–200)
Basophils Relative: 0.8 %
Eosinophils Absolute: 171 cells/uL (ref 15–500)
Eosinophils Relative: 2.8 %
HCT: 41.1 % (ref 35.0–45.0)
Hemoglobin: 14.2 g/dL (ref 11.7–15.5)
Lymphs Abs: 1196 cells/uL (ref 850–3900)
MCH: 32.3 pg (ref 27.0–33.0)
MCHC: 34.5 g/dL (ref 32.0–36.0)
MCV: 93.6 fL (ref 80.0–100.0)
MPV: 13.2 fL — ABNORMAL HIGH (ref 7.5–12.5)
Monocytes Relative: 14.2 %
Neutro Abs: 3819 cells/uL (ref 1500–7800)
Neutrophils Relative %: 62.6 %
Platelets: 106 10*3/uL — ABNORMAL LOW (ref 140–400)
RBC: 4.39 10*6/uL (ref 3.80–5.10)
RDW: 12.6 % (ref 11.0–15.0)
Total Lymphocyte: 19.6 %
WBC: 6.1 10*3/uL (ref 3.8–10.8)

## 2018-10-04 ENCOUNTER — Other Ambulatory Visit: Payer: Self-pay

## 2018-10-04 ENCOUNTER — Encounter: Payer: Self-pay | Admitting: Internal Medicine

## 2018-10-04 ENCOUNTER — Ambulatory Visit (INDEPENDENT_AMBULATORY_CARE_PROVIDER_SITE_OTHER): Payer: Medicare Other | Admitting: Internal Medicine

## 2018-10-04 VITALS — BP 112/60 | HR 53 | Temp 98.2°F | Ht 63.0 in | Wt 151.0 lb

## 2018-10-04 DIAGNOSIS — K7460 Unspecified cirrhosis of liver: Secondary | ICD-10-CM

## 2018-10-04 DIAGNOSIS — D696 Thrombocytopenia, unspecified: Secondary | ICD-10-CM | POA: Diagnosis not present

## 2018-10-04 DIAGNOSIS — M5432 Sciatica, left side: Secondary | ICD-10-CM | POA: Diagnosis not present

## 2018-10-04 DIAGNOSIS — I25810 Atherosclerosis of coronary artery bypass graft(s) without angina pectoris: Secondary | ICD-10-CM

## 2018-10-04 DIAGNOSIS — E1169 Type 2 diabetes mellitus with other specified complication: Secondary | ICD-10-CM | POA: Diagnosis not present

## 2018-10-04 DIAGNOSIS — E785 Hyperlipidemia, unspecified: Secondary | ICD-10-CM | POA: Diagnosis not present

## 2018-10-04 DIAGNOSIS — I2510 Atherosclerotic heart disease of native coronary artery without angina pectoris: Secondary | ICD-10-CM

## 2018-10-04 DIAGNOSIS — E1165 Type 2 diabetes mellitus with hyperglycemia: Secondary | ICD-10-CM | POA: Diagnosis not present

## 2018-10-04 MED ORDER — EMPAGLIFLOZIN 10 MG PO TABS: 10.0000 mg | ORAL_TABLET | Freq: Every day | ORAL | 0 refills | Status: DC

## 2018-10-04 NOTE — Progress Notes (Signed)
Location:  [facility] clinic Provider:  Avielle Imbert L. Mariea Clonts, D.O., C.M.D.  Goals of Care:  Advanced Directives 05/30/2018  Does Patient Have a Medical Advance Directive? No  Does patient want to make changes to medical advance directive? -  Would patient like information on creating a medical advance directive? Yes (ED - Information included in AVS)  has been given advance directive information, but she and her husband have not completed or returned said documents  Chief Complaint  Patient presents with  . Medical Management of Chronic Issues    75mh follow-up    HPI: Patient is a 73y.o. female seen today for medical management of chronic diseases.    hba1c has trended back up to 10.  She still gets some low sugars in the morning.  Lunchtime or after it is when she runs high.    Gabapentin for her hip pain did not help at all.  She's still got a full bottle--she only tried once.  Previously a shot for that sciatica also did not help.  Asks about chiropractor for this.  It's ok when sitting or lying down.  It bothers her when up walking or standing (like washing dishes or preparing a meal).    Eating has not changed, but she's not even getting the exercise of walking through the grocery store.  Weight is up a little.    Past Medical History:  Diagnosis Date  . Allergy   . Arthritis    neck  . Cataract    bilateral - MD monitoring cataracts  . CHF (congestive heart failure) ([place]   . Chronic kidney disease, stage I    DR OTTELIN  HX UTIS  . Cirrhosis ([place]   . Cramp of limb   . Diabetes mellitus   . Dysphagia, unspecified(787.20)   . Dysuria   . Epistaxis   . GERD (gastroesophageal reflux disease)   . Heart murmur    NO CARDIOLOGIST  DX FOR YEARS ASYMPTOMATIC  . Lumbago   . Neoplasm of uncertain behavior of skin   . Nonspecific elevation of levels of transaminase or lactic acid dehydrogenase (LDH)   . Osteoarthrosis, unspecified whether generalized or localized, unspecified site    . Other and unspecified hyperlipidemia    diet controlled  . Pain in joint, shoulder region   . Paresthesias 04/01/2015  . Postablative ovarian failure   . Trochanteric bursitis of left hip 12/15/2015  . Type 2 diabetes mellitus without complication ([place]   . Unspecified essential hypertension    no meds    Past Surgical History:  Procedure Laterality Date  . BREAST BIOPSY    . CARDIAC CATHETERIZATION N/A 01/27/2016   Procedure: Left Heart Cath and Coronary Angiography;  Surgeon: HBelva Crome MD;  [facility];  Service: Cardiovascular;  Laterality: N/A;  . COLONOSCOPY  2012   [facility] - polyp  . COLONOSCOPY WITH PROPOFOL N/A 07/07/2016   Procedure: COLONOSCOPY WITH PROPOFOL;  Surgeon: DMilus Banister MD;  [facility]: [facility];  Service: Endoscopy;  Laterality: N/A;  . CORONARY ARTERY BYPASS GRAFT N/A 01/28/2016   Procedure: CORONARY ARTERY BYPASS GRAFTING (CABG) x 3 USING RIGHT LEG GREATER SAPHENOUS VEIN GRAFT;  Surgeon: SMelrose Nakayama MD;  [place] [facility]  Service: Open Heart Surgery;  Laterality: N/A;  . ENDOVEIN HARVEST OF GREATER SAPHENOUS VEIN Right 01/28/2016   Procedure: ENDOVEIN HARVEST OF GREATER SAPHENOUS VEIN;  Surgeon: SMelrose Nakayama MD;  [place] [facility]  Service: Open Heart Surgery;  Laterality: Right;  . ESOPHAGOGASTRODUODENOSCOPY (EGD) WITH PROPOFOL N/A 07/07/2016   Procedure: ESOPHAGOGASTRODUODENOSCOPY (EGD) WITH PROPOFOL;  Surgeon: [name], MD;  Location[facility];  Service: Endoscopy;  Laterality: N/A;  . MAXIMUM ACCESS (MAS)POSTERIOR LUMBAR INTERBODY FUSION (PLIF) 1 LEVEL Left 06/10/2015   Procedure: FOR MAXIMUM ACCESS (MAS) POSTERIOR LUMBAR INTERBODY FUSION (PLIF) LUMBAR THREE-FOUR EXTRAFORAMINAL MICRODISCECTOMY LUMBAR FIVE-SACRAL ONE LEFT;  Surgeon: Eustace Moore, MD;  Location: [facility];  Service: Neurosurgery;  Laterality: Left;  . TEE WITHOUT CARDIOVERSION N/A 01/28/2016   Procedure: TRANSESOPHAGEAL ECHOCARDIOGRAM (TEE);   Surgeon: Melrose Nakayama, MD;  Location: [facility];  Service: Open Heart Surgery;  Laterality: N/A;  . TUBAL LIGATION  1982   Dr Connye Burkitt  . UPPER GASTROINTESTINAL ENDOSCOPY    . VAGINAL HYSTERECTOMY  1997   Dr Rande Lawman    Allergies  Allergen Reactions  . Kiwi Extract Anaphylaxis  . Tdap [Tetanus-Diphth-Acell Pertussis] Swelling and Other (See Comments)    Swelling at injection site, gets very hot  . Statins     RHABDOMYOLYSIS  . Latex Itching, Dermatitis and Rash  . Tramadol Nausea And Vomiting    Outpatient Encounter Medications as of 10/04/2018  Medication Sig  . acetaminophen (TYLENOL) 500 MG tablet Take 500 mg by mouth at bedtime as needed for mild pain.  . Aromatic Inhalants (VICKS VAPOR IN) Vicks Vapor Rub apply small amount to outside of nose to help breathing  . aspirin EC 81 MG tablet Take 1 tablet (81 mg total) by mouth daily.  . BD PEN NEEDLE NANO U/F 32G X 4 MM MISC USE THREE TIMES DAILY AS DIRECTED  . Biotin 10000 MCG TABS Take 10,000 mcg by mouth every morning.  . calcium carbonate (OS-CAL) 600 MG TABS Take 600 mg by mouth 2 (two) times daily with a meal.    . cholecalciferol (VITAMIN D) 1000 UNITS tablet Take 2,000 Units by mouth daily.   . Cyanocobalamin (VITAMIN B 12 PO) Take 1,000 mcg by mouth daily.    [name] Kitchen ezetimibe (ZETIA) 10 MG tablet TAKE 1 TABLET(10 MG) BY MOUTH DAILY  . Flaxseed, Linseed, (FLAXSEED OIL PO) Take 1 tablet by mouth daily.  . furosemide (LASIX) 40 MG tablet TAKE 1 TABLET(40 MG) BY MOUTH DAILY  . gabapentin (NEURONTIN) 100 MG capsule Take 1 capsule (100 mg total) by mouth daily.  [name] Kitchen glucose blood test strip One Touch Ultra II strips. Use to test blood sugar three times daily. Dx: E11.65  . insulin aspart (NOVOLOG FLEXPEN) 100 UNIT/ML FlexPen 14 units with breakfast and lunch, 12 units with supper  . Insulin Syringe-Needle U-100 (INSULIN SYRINGE 1CC/31GX5/16") 31G X 5/16" 1 ML MISC USE AS DIRECTED DAILY WITH LEVEMIR  . LEVEMIR 100 UNIT/ML injection  ADMINISTER 55 UNITS UNDER THE SKIN EVERY DAY  . MAGNESIUM PO Take 500 mg by mouth 2 (two) times daily in the am and at bedtime..  . Multiple Vitamins-Minerals (MULTIVITAMIN WITH MINERALS) tablet Take 1 tablet by mouth daily.    . nadolol (CORGARD) 40 MG tablet Take 1 tablet (40 mg total) by mouth daily.  [name] Kitchen omeprazole (PRILOSEC) 20 MG capsule TAKE 1 CAPSULE BY MOUTH DAILY  . Polyethyl Glycol-Propyl Glycol (SYSTANE OP) Place 1 drop into both eyes 2 (two) times daily.  . potassium chloride SA (K-DUR) 20 MEQ tablet TAKE 1 TABLET BY MOUTH EVERY DAY  . Probiotic Product (PROBIOTIC DAILY PO) Take 1 capsule by mouth daily. Digestive Advantage Probiotic  . spironolactone (ALDACTONE) 100 MG tablet TAKE 1 TABLET(100  MG) BY MOUTH TWICE DAILY  . [DISCONTINUED] cephALEXin (KEFLEX) 500 MG capsule Take 1 capsule by mouth 2 (two) times daily.   No facility-administered encounter medications on file as of 10/04/2018.     Review of Systems:  Review of Systems  Constitutional: Positive for malaise/fatigue. Negative for chills and fever.  HENT: Negative for congestion.   Eyes: Negative for blurred vision.  Respiratory: Negative for cough and shortness of breath.   Cardiovascular: Negative for chest pain, palpitations and leg swelling.  Gastrointestinal: Negative for abdominal pain, blood in stool, constipation, diarrhea and melena.  Genitourinary: Negative for dysuria, frequency and urgency.  Musculoskeletal: Positive for back pain and joint pain. Negative for falls.  Skin: Negative for itching and rash.  Neurological: Positive for tingling and sensory change. Negative for loss of consciousness.  Endo/Heme/Allergies: Bruises/bleeds easily.       Uncontrolled diabetes  Psychiatric/Behavioral: Negative for depression and memory loss. The patient is not nervous/anxious and does not have insomnia.     Health Maintenance  Topic Date Due  . FOOT EXAM  05/29/2018  . OPHTHALMOLOGY EXAM  08/24/2018  . INFLUENZA  VACCINE  12/08/2018  . URINE MICROALBUMIN  01/30/2019  . HEMOGLOBIN A1C  04/04/2019  . MAMMOGRAM  09/25/2020  . COLONOSCOPY  07/07/2021  . DEXA SCAN  Completed  . Hepatitis C Screening  Completed  . PNA vac Low Risk Adult  Completed  . TETANUS/TDAP  Discontinued    Physical Exam: Vitals:   10/04/18 1030  BP: 112/60  Pulse: (!) 53  Temp: 98.2 F (36.8 C)  TempSrc: Oral  SpO2: 96%  Weight: 151 lb (68.5 kg)  Height: 5' 3"  (1.6 m)   Body mass index is 26.75 kg/m. Physical Exam Vitals signs reviewed.  Constitutional:      General: She is not in acute distress.    Appearance: Normal appearance. She is not toxic-appearing.  HENT:     Head: Normocephalic and atraumatic.  Cardiovascular:     Rate and Rhythm: Normal rate and regular rhythm.     Pulses: Normal pulses.     Heart sounds: Normal heart sounds.  Pulmonary:     Effort: Pulmonary effort is normal.     Breath sounds: Normal breath sounds. No wheezing, rhonchi or rales.  Abdominal:     General: Bowel sounds are normal.  Musculoskeletal: Normal range of motion.        General: Tenderness present.     Comments: Left SI region and into buttock  Skin:    General: Skin is warm and dry.  Neurological:     General: No focal deficit present.     Mental Status: She is alert and oriented to person, place, and time.     Cranial Nerves: No cranial nerve deficit.  Psychiatric:        Mood and Affect: Mood normal.        Behavior: Behavior normal.        Thought Content: Thought content normal.        Judgment: Judgment normal.     Labs reviewed: Basic Metabolic Panel: Recent Labs    04/25/18 1453 05/24/18 0857 10/02/18 0922  NA 141 139 138  K 3.9 4.1 4.3  CL 103 104 103  CO2 31 29 28   GLUCOSE 219* 85 120*  BUN 21 23 22   CREATININE 1.26* 1.05* 1.06*  CALCIUM 10.4 9.7 9.5   Liver Function Tests: Recent Labs    01/25/18 0935 04/25/18 1453 05/24/18 0857  AST  70* 76* 50*  ALT 78* 80* 53*  ALKPHOS  --  107   --   BILITOT 1.0 0.8 1.2  PROT 7.0 7.0 7.0  ALBUMIN  --  3.4*  --    No results for input(s): LIPASE, AMYLASE in the last 8760 hours. No results for input(s): AMMONIA in the last 8760 hours. CBC: Recent Labs    04/25/18 1453 10/02/18 0922  WBC 5.0 6.1  NEUTROABS 2.8 3,819  HGB 14.1 14.2  HCT 42.2 41.1  MCV 95.3 93.6  PLT 101.0* 106*   Lipid Panel: Recent Labs    01/25/18 0935 05/24/18 0857  CHOL 155 147  HDL 58 53  LDLCALC 82 80  TRIG 70 64  CHOLHDL 2.7 2.8   Lab Results  Component Value Date   HGBA1C 10.0 (H) 10/02/2018    Procedures since last visit: Mm 3d Screen Breast Bilateral  Result Date: 09/27/2018 CLINICAL DATA:  Screening. EXAM: DIGITAL SCREENING BILATERAL MAMMOGRAM WITH TOMO AND CAD COMPARISON:  Previous exam(s). ACR Breast Density Category b: There are scattered areas of fibroglandular density. FINDINGS: There are no findings suspicious for malignancy. Images were processed with CAD. IMPRESSION: No mammographic evidence of malignancy. A result letter of this screening mammogram will be mailed directly to the patient. RECOMMENDATION: Screening mammogram in one year. (Code:SM-B-01Y) BI-RADS CATEGORY  1: Negative. Electronically Signed   By: Lajean Manes M.D.   On: 09/27/2018 10:48    Assessment/Plan 1. Uncontrolled type 2 diabetes mellitus with hyperglycemia (HCC) - hba1c 10 again -she agrees to try jardiance 19m if affordable for her -she has a cardiology f/u coming up so she has labs for it and I will watch for those -will also increased noontime novolog to 16 units as afternoon sugars remain highest, keep rest of insulin the same - empagliflozin (JARDIANCE) 10 MG TABS tablet; Take 10 mg by mouth daily.  Dispense: 30 tablet; Refill: 0 - CBC with Differential/Platelet; Future - Basic metabolic panel; Future - Hemoglobin A1c; Future  2. Left sided sciatica -ongoing; she wants to go to a chiropractor rather than PT as next step -advised to remind them  of her age so they avoid high velocity low amplitude techniques and focus on muscle energy or other more conservative approaches like heat and ultrasound  3. Cirrhosis of liver without ascites, unspecified hepatic cirrhosis type ([place] - followed by GI  -no new symptoms or changes - Hepatic function panel; Future  4. Coronary artery disease involving native coronary artery of native heart without angina pectoris -jardiance will have an added heart protection benefit also if she can afford and tolerate it  5. Hyperlipidemia associated with type 2 diabetes mellitus ([place] [employer] -on zetia alone with her liver disease and prior legitimate statin myopathy with elevated CKs  6.  Thrombocytopenia -chronic and related to her cirrhosis, monitor; has never fallen into worrisome range  Labs/tests ordered:   Orders Placed This Encounter  Procedures  . CBC with Differential/Platelet    Standing Status:   Future    Standing Expiration Date:   10/04/2019  . Hepatic function panel    Standing Status:   Future    Standing Expiration Date:   10/04/2019  . Basic metabolic panel    Standing Status:   Future    Standing Expiration Date:   10/04/2019    Order Specific Question:   Has the patient fasted?    Answer:   Yes  . Hemoglobin A1c    Standing Status:   Future  Standing Expiration Date:   10/04/2019    Next appt:  01/31/2019 med mgt, fasting labs before  Kinley Dozier L. Kylon Philbrook, D.O. [facility] [address], [place] [address] Cell Phone (Mon-Fri 8am-5pm):  [phone] On Call:  [phone] & follow prompts after 5pm & weekends Office Phone:  [phone] Office Fax:  [phone]

## 2018-10-04 NOTE — Patient Instructions (Addendum)
Increase your lunch time insulin to 16 units.   Try jardiance 50m daily in the morning to try to bring down your sugar and to help protect your heart.   Be sure you are drinking plenty of water because your can get dehydrated more easily with this medication (especially along with your diuretics).

## 2018-10-16 ENCOUNTER — Other Ambulatory Visit: Payer: Self-pay | Admitting: Internal Medicine

## 2018-11-03 ENCOUNTER — Other Ambulatory Visit: Payer: Self-pay | Admitting: Internal Medicine

## 2018-11-03 DIAGNOSIS — E1165 Type 2 diabetes mellitus with hyperglycemia: Secondary | ICD-10-CM

## 2018-11-25 ENCOUNTER — Other Ambulatory Visit: Payer: Self-pay | Admitting: Internal Medicine

## 2018-12-01 ENCOUNTER — Other Ambulatory Visit: Payer: Self-pay | Admitting: Internal Medicine

## 2018-12-01 DIAGNOSIS — Z794 Long term (current) use of insulin: Secondary | ICD-10-CM

## 2018-12-01 DIAGNOSIS — E1165 Type 2 diabetes mellitus with hyperglycemia: Secondary | ICD-10-CM

## 2018-12-11 ENCOUNTER — Other Ambulatory Visit: Payer: Self-pay | Admitting: Internal Medicine

## 2018-12-11 DIAGNOSIS — E876 Hypokalemia: Secondary | ICD-10-CM

## 2018-12-26 ENCOUNTER — Other Ambulatory Visit: Payer: Self-pay | Admitting: Internal Medicine

## 2018-12-26 DIAGNOSIS — E1169 Type 2 diabetes mellitus with other specified complication: Secondary | ICD-10-CM

## 2019-01-24 ENCOUNTER — Other Ambulatory Visit: Payer: Medicare Other

## 2019-01-24 ENCOUNTER — Other Ambulatory Visit: Payer: Self-pay

## 2019-01-24 DIAGNOSIS — K7460 Unspecified cirrhosis of liver: Secondary | ICD-10-CM

## 2019-01-24 DIAGNOSIS — E1165 Type 2 diabetes mellitus with hyperglycemia: Secondary | ICD-10-CM

## 2019-01-25 LAB — BASIC METABOLIC PANEL
BUN/Creatinine Ratio: 22 (calc) (ref 6–22)
BUN: 24 mg/dL (ref 7–25)
CO2: 29 mmol/L (ref 20–32)
Calcium: 9.6 mg/dL (ref 8.6–10.4)
Chloride: 103 mmol/L (ref 98–110)
Creat: 1.11 mg/dL — ABNORMAL HIGH (ref 0.60–0.93)
Glucose, Bld: 68 mg/dL (ref 65–139)
Potassium: 4.5 mmol/L (ref 3.5–5.3)
Sodium: 138 mmol/L (ref 135–146)

## 2019-01-25 LAB — CBC WITH DIFFERENTIAL/PLATELET
Absolute Monocytes: 887 cells/uL (ref 200–950)
Basophils Absolute: 50 cells/uL (ref 0–200)
Basophils Relative: 0.8 %
Eosinophils Absolute: 192 cells/uL (ref 15–500)
Eosinophils Relative: 3.1 %
HCT: 44.6 % (ref 35.0–45.0)
Hemoglobin: 15.1 g/dL (ref 11.7–15.5)
Lymphs Abs: 1327 cells/uL (ref 850–3900)
MCH: 30.8 pg (ref 27.0–33.0)
MCHC: 33.9 g/dL (ref 32.0–36.0)
MCV: 90.8 fL (ref 80.0–100.0)
MPV: 13.2 fL — ABNORMAL HIGH (ref 7.5–12.5)
Monocytes Relative: 14.3 %
Neutro Abs: 3745 cells/uL (ref 1500–7800)
Neutrophils Relative %: 60.4 %
Platelets: 132 10*3/uL — ABNORMAL LOW (ref 140–400)
RBC: 4.91 10*6/uL (ref 3.80–5.10)
RDW: 12.6 % (ref 11.0–15.0)
Total Lymphocyte: 21.4 %
WBC: 6.2 10*3/uL (ref 3.8–10.8)

## 2019-01-25 LAB — HEMOGLOBIN A1C
Hgb A1c MFr Bld: 9.7 % of total Hgb — ABNORMAL HIGH (ref <5.7)
Mean Plasma Glucose: 232 (calc)
eAG (mmol/L): 12.8 (calc)

## 2019-01-25 LAB — HEPATIC FUNCTION PANEL
AG Ratio: 0.9 (calc) — ABNORMAL LOW (ref 1.0–2.5)
ALT: 84 U/L — ABNORMAL HIGH (ref 6–29)
AST: 74 U/L — ABNORMAL HIGH (ref 10–35)
Albumin: 3.5 g/dL — ABNORMAL LOW (ref 3.6–5.1)
Alkaline phosphatase (APISO): 120 U/L (ref 37–153)
Bilirubin, Direct: 0.3 mg/dL — ABNORMAL HIGH (ref 0.0–0.2)
Globulin: 3.7 g/dL (calc) (ref 1.9–3.7)
Indirect Bilirubin: 0.6 mg/dL (calc) (ref 0.2–1.2)
Total Bilirubin: 0.9 mg/dL (ref 0.2–1.2)
Total Protein: 7.2 g/dL (ref 6.1–8.1)

## 2019-01-31 ENCOUNTER — Ambulatory Visit (INDEPENDENT_AMBULATORY_CARE_PROVIDER_SITE_OTHER): Payer: Medicare Other | Admitting: Internal Medicine

## 2019-01-31 ENCOUNTER — Encounter: Payer: Self-pay | Admitting: Internal Medicine

## 2019-01-31 ENCOUNTER — Other Ambulatory Visit: Payer: Self-pay

## 2019-01-31 VITALS — BP 99/62 | HR 56 | Temp 98.1°F | Ht 63.0 in | Wt 146.4 lb

## 2019-01-31 DIAGNOSIS — I25118 Atherosclerotic heart disease of native coronary artery with other forms of angina pectoris: Secondary | ICD-10-CM | POA: Insufficient documentation

## 2019-01-31 DIAGNOSIS — I8510 Secondary esophageal varices without bleeding: Secondary | ICD-10-CM | POA: Diagnosis not present

## 2019-01-31 DIAGNOSIS — C220 Liver cell carcinoma: Secondary | ICD-10-CM | POA: Diagnosis not present

## 2019-01-31 DIAGNOSIS — Z23 Encounter for immunization: Secondary | ICD-10-CM | POA: Diagnosis not present

## 2019-01-31 DIAGNOSIS — E1165 Type 2 diabetes mellitus with hyperglycemia: Secondary | ICD-10-CM | POA: Diagnosis not present

## 2019-01-31 DIAGNOSIS — Z794 Long term (current) use of insulin: Secondary | ICD-10-CM

## 2019-01-31 DIAGNOSIS — I25810 Atherosclerosis of coronary artery bypass graft(s) without angina pectoris: Secondary | ICD-10-CM

## 2019-01-31 MED ORDER — INSULIN DETEMIR 100 UNIT/ML ~~LOC~~ SOLN: 48.0000 [IU] | Freq: Every day | SUBCUTANEOUS | 3 refills | Status: DC

## 2019-01-31 MED ORDER — JARDIANCE 25 MG PO TABS: 25.0000 mg | ORAL_TABLET | Freq: Every day | ORAL | 3 refills | Status: DC

## 2019-01-31 NOTE — Patient Instructions (Addendum)
Decrease levemir to 48 units. Continue the same novolog doses Increase jardiance to 66m (you may use 2 of your 181mpills at a time to use those up) If you have more lows, let me know.  Also if your sugars get too high OR you have more side effects from the jardiance. You may use a water-based lubricant for the irritation.  Please call Dr. DiBing Plumeor your eye appointment and Dr. JaArdis Hughsor your ultrasound and f/u appt with him.

## 2019-01-31 NOTE — Progress Notes (Signed)
Location:  [facility] clinic Provider:  Anglea Gordner L. Mariea Clonts, D.O., C.M.D.  Goals of Care:  Advanced Directives 05/30/2018  Does Patient Have a Medical Advance Directive? No  Does patient want to make changes to medical advance directive? -  Would patient like information on creating a medical advance directive? Yes (ED - Information included in AVS)  Discussed several times but has never done  Chief Complaint  Patient presents with  . Medical Management of Chronic Issues    Routine 4 month followup  . Immunizations    Flu shot given today  . Best Practice Recommendations    Requested shingles vaccine  . Quality Metric Gaps    Ophthalmologic exam - has appointment scheduled with Dr. Bing Plume, needs urine microalbumin    HPI: Patient is a 73 y.o. female seen today for medical management of chronic diseases.    DMII--hba1c now 9.7.  She's on jardiance 52m and her levemir and novolog at meals.  This morning CBG was 90.  Mostly the lows are in the morning.  She then holds her novolog for breakfast--will then use at lunch or supper.  Mostly has highs after supper--144 or so.  They have 1/2 orange at bedtime.   Sees Dr. DKonrad Sahanot know when it is.  Has derm appt October 14th--[facility]  Past Medical History:  Diagnosis Date  . Allergy   . Arthritis    neck  . Cataract    bilateral - MD monitoring cataracts  . CHF (congestive heart failure) ([place]   . Chronic kidney disease, stage I    DR OTTELIN  HX UTIS  . Cirrhosis ([place]   . Cramp of limb   . Diabetes mellitus   . Dysphagia, unspecified(787.20)   . Dysuria   . Epistaxis   . GERD (gastroesophageal reflux disease)   . Heart murmur    NO CARDIOLOGIST  DX FOR YEARS ASYMPTOMATIC  . Lumbago   . Neoplasm of uncertain behavior of skin   . Nonspecific elevation of levels of transaminase or lactic acid dehydrogenase (LDH)   . Osteoarthrosis, unspecified whether generalized or localized,  unspecified site   . Other and unspecified hyperlipidemia    diet controlled  . Pain in joint, shoulder region   . Paresthesias 04/01/2015  . Postablative ovarian failure   . Trochanteric bursitis of left hip 12/15/2015  . Type 2 diabetes mellitus without complication ([place]   . Unspecified essential hypertension    no meds    Past Surgical History:  Procedure Laterality Date  . BREAST BIOPSY    . CARDIAC CATHETERIZATION N/A 01/27/2016   Procedure: Left Heart Cath and Coronary Angiography;  Surgeon: HBelva Crome MD;  [facility];  Service: Cardiovascular;  Laterality: N/A;  . COLONOSCOPY  2012   [place] - polyp  . COLONOSCOPY WITH PROPOFOL N/A 07/07/2016   Procedure: COLONOSCOPY WITH PROPOFOL;  Surgeon: DMilus Banister MD;  [facility];  Service: Endoscopy;  Laterality: N/A;  . CORONARY ARTERY BYPASS GRAFT N/A 01/28/2016   Procedure: CORONARY ARTERY BYPASS GRAFTING (CABG) x 3 USING RIGHT LEG GREATER SAPHENOUS VEIN GRAFT;  Surgeon: SMelrose Nakayama MD;  [place]  Service: Open Heart Surgery;  Laterality: N/A;  . ENDOVEIN HARVEST OF GREATER SAPHENOUS VEIN Right 01/28/2016   Procedure: ENDOVEIN HARVEST OF GREATER SAPHENOUS VEIN;  Surgeon: SMelrose Nakayama MD;  [place]  Service: Open Heart Surgery;  Laterality: Right;  . ESOPHAGOGASTRODUODENOSCOPY (  EGD) WITH PROPOFOL N/A 07/07/2016   Procedure: ESOPHAGOGASTRODUODENOSCOPY (EGD) WITH PROPOFOL;  Surgeon: [name], MD;  [facility];  Service: Endoscopy;  Laterality: N/A;  . MAXIMUM ACCESS (MAS)POSTERIOR LUMBAR INTERBODY FUSION (PLIF) 1 LEVEL Left 06/10/2015   Procedure: FOR MAXIMUM ACCESS (MAS) POSTERIOR LUMBAR INTERBODY FUSION (PLIF) LUMBAR THREE-FOUR EXTRAFORAMINAL MICRODISCECTOMY LUMBAR FIVE-SACRAL ONE LEFT;  Surgeon: Eustace Moore, MD;  Location: [place] [facility];  Service: Neurosurgery;  Laterality: Left;  . TEE WITHOUT CARDIOVERSION N/A 01/28/2016   Procedure: TRANSESOPHAGEAL  ECHOCARDIOGRAM (TEE);  Surgeon: Melrose Nakayama, MD;  Location: [place];  Service: Open Heart Surgery;  Laterality: N/A;  . TUBAL LIGATION  1982   Dr Connye Burkitt  . UPPER GASTROINTESTINAL ENDOSCOPY    . VAGINAL HYSTERECTOMY  1997   Dr Rande Lawman    Allergies  Allergen Reactions  . Kiwi Extract Anaphylaxis  . Tdap [Tetanus-Diphth-Acell Pertussis] Swelling and Other (See Comments)    Swelling at injection site, gets very hot  . Statins     RHABDOMYOLYSIS  . Latex Itching, Dermatitis and Rash  . Tramadol Nausea And Vomiting    Outpatient Encounter Medications as of 01/31/2019  Medication Sig  . acetaminophen (TYLENOL) 500 MG tablet Take 500 mg by mouth at bedtime as needed for mild pain.  . Aromatic Inhalants (VICKS VAPOR IN) Vicks Vapor Rub apply small amount to outside of nose to help breathing  . aspirin EC 81 MG tablet Take 1 tablet (81 mg total) by mouth daily.  . BD PEN NEEDLE NANO U/F 32G X 4 MM MISC USE THREE TIMES DAILY AS DIRECTED  . Biotin 10000 MCG TABS Take 10,000 mcg by mouth every morning.  . calcium carbonate (OS-CAL) 600 MG TABS Take 600 mg by mouth 2 (two) times daily with a meal.    . cholecalciferol (VITAMIN D) 1000 UNITS tablet Take 2,000 Units by mouth daily.   . Cyanocobalamin (VITAMIN B 12 PO) Take 1,000 mcg by mouth daily.    [name] Kitchen ezetimibe (ZETIA) 10 MG tablet TAKE 1 TABLET(10 MG) BY MOUTH DAILY  . Flaxseed, Linseed, (FLAXSEED OIL PO) Take 1 tablet by mouth daily.  . furosemide (LASIX) 40 MG tablet TAKE 1 TABLET(40 MG) BY MOUTH DAILY  . glucose blood test strip One Touch Ultra II strips. Use to test blood sugar three times daily. Dx: E11.65  . Insulin Syringe-Needle U-100 (INSULIN SYRINGE 1CC/31GX5/16") 31G X 5/16" 1 ML MISC USE AS DIRECTED DAILY WITH LEVEMIR  . JARDIANCE 10 MG TABS tablet TAKE 1 TABLET BY MOUTH DAILY  . LEVEMIR 100 UNIT/ML injection ADMINISTER 55 UNITS UNDER THE SKIN EVERY DAY  . loratadine (CLARITIN) 10 MG tablet Take 10 mg by mouth daily as needed  for allergies.  [name] Kitchen MAGNESIUM PO Take 500 mg by mouth 2 (two) times daily in the am and at bedtime..  . Multiple Vitamins-Minerals (MULTIVITAMIN WITH MINERALS) tablet Take 1 tablet by mouth daily.    . nadolol (CORGARD) 40 MG tablet Take 1 tablet (40 mg total) by mouth daily.  [name] Kitchen NOVOLOG FLEXPEN 100 UNIT/ML FlexPen INJECT 12 UNITS AS DIRECTED IN THE MORNING, 8 UNITS AS DIRECTED AT LUNCH AND 12 UNITS UNDER THE SKIN AT SUPPER TO CONTROL BLOOD SUGAR  . omeprazole (PRILOSEC) 20 MG capsule TAKE 1 CAPSULE BY MOUTH DAILY  . Polyethyl Glycol-Propyl Glycol (SYSTANE OP) Place 1 drop into both eyes 2 (two) times daily.  . potassium chloride SA (K-DUR) 20 MEQ tablet TAKE 1 TABLET BY MOUTH EVERY DAY  . Probiotic  Product (PROBIOTIC DAILY PO) Take 1 capsule by mouth daily. Digestive Advantage Probiotic  . spironolactone (ALDACTONE) 100 MG tablet TAKE 1 TABLET(100 MG) BY MOUTH TWICE DAILY   No facility-administered encounter medications on file as of 01/31/2019.     Review of Systems:  Review of Systems  Constitutional: Negative for chills, fever and malaise/fatigue.  HENT: Negative for congestion.   Eyes: Negative for blurred vision.  Respiratory: Negative for cough and shortness of breath.   Cardiovascular: Negative for chest pain, palpitations and leg swelling.  Gastrointestinal: Negative for abdominal pain, blood in stool, constipation, diarrhea and melena.  Genitourinary: Positive for dysuria. Negative for frequency and urgency.  Musculoskeletal: Negative for falls and joint pain.  Skin: Negative for itching and rash.  Neurological: Negative for dizziness and loss of consciousness.  Endo/Heme/Allergies: Bruises/bleeds easily.  Psychiatric/Behavioral: Negative for depression and memory loss. The patient is not nervous/anxious and does not have insomnia.     Health Maintenance  Topic Date Due  . OPHTHALMOLOGY EXAM  08/24/2018  . INFLUENZA VACCINE  12/08/2018  . URINE MICROALBUMIN  01/30/2019  .  HEMOGLOBIN A1C  07/24/2019  . FOOT EXAM  10/04/2019  . MAMMOGRAM  09/25/2020  . COLONOSCOPY  07/07/2021  . DEXA SCAN  Completed  . Hepatitis C Screening  Completed  . PNA vac Low Risk Adult  Completed  . TETANUS/TDAP  Discontinued    Physical Exam: Vitals:   01/31/19 1011  BP: 99/62  Pulse: (!) 56  Temp: 98.1 F (36.7 C)  TempSrc: Oral  SpO2: 98%  Weight: 146 lb 6.4 oz (66.4 kg)  Height: 5' 3"  (1.6 m)   Body mass index is 25.93 kg/m. Physical Exam Vitals signs reviewed.  Constitutional:      Appearance: Normal appearance.  HENT:     Head: Normocephalic and atraumatic.  Cardiovascular:     Rate and Rhythm: Normal rate and regular rhythm.     Pulses: Normal pulses.     Heart sounds: Normal heart sounds.  Pulmonary:     Effort: Pulmonary effort is normal.     Breath sounds: Normal breath sounds.  Abdominal:     General: Bowel sounds are normal. There is no distension.     Palpations: Abdomen is soft.     Tenderness: There is no abdominal tenderness. There is no guarding or rebound.  Musculoskeletal: Normal range of motion.     Right lower leg: No edema.     Left lower leg: No edema.  Skin:    General: Skin is warm and dry.  Neurological:     General: No focal deficit present.     Mental Status: She is alert and oriented to person, place, and time.  Psychiatric:        Mood and Affect: Mood normal.     Labs reviewed: Basic Metabolic Panel: Recent Labs    05/24/18 0857 10/02/18 0922 01/24/19 0907  NA 139 138 138  K 4.1 4.3 4.5  CL 104 103 103  CO2 29 28 29   GLUCOSE 85 120* 68  BUN 23 22 24   CREATININE 1.05* 1.06* 1.11*  CALCIUM 9.7 9.5 9.6   Liver Function Tests: Recent Labs    04/25/18 1453 05/24/18 0857 01/24/19 0907  AST 76* 50* 74*  ALT 80* 53* 84*  ALKPHOS 107  --   --   BILITOT 0.8 1.2 0.9  PROT 7.0 7.0 7.2  ALBUMIN 3.4*  --   --    No results for input(s): LIPASE, AMYLASE  in the last 8760 hours. No results for input(s): AMMONIA in  the last 8760 hours. CBC: Recent Labs    04/25/18 1453 10/02/18 0922 01/24/19 0907  WBC 5.0 6.1 6.2  NEUTROABS 2.8 3,819 3,745  HGB 14.1 14.2 15.1  HCT 42.2 41.1 44.6  MCV 95.3 93.6 90.8  PLT 101.0* 106* 132*   Lipid Panel: Recent Labs    05/24/18 0857  CHOL 147  HDL 53  LDLCALC 80  TRIG 64  CHOLHDL 2.8   Lab Results  Component Value Date   HGBA1C 9.7 (H) 01/24/2019    Assessment/Plan 1. Need for influenza vaccination - Flu Vaccine QUAD High Dose(Fluad)  2. Need for shingrix vaccine -CMA sent to pharmacy  3. Uncontrolled type 2 diabetes mellitus with hyperglycemia (HCC) -check urine micro today -had some slight improvement in hba1c with jardiance 69m added to regimen, but having a few lows in mornings now -increase jardiance to 228m(use lubricant for vaginal irritation)--may use 2 of the 1031mntil uses up since just filled -decrease levemir to 48 from 55 hoping to decrease am hypoglycemia, does hs snack -keep novolog the same - empagliflozin (JARDIANCE) 25 MG TABS tablet; Take 25 mg by mouth daily.  Dispense: 30 tablet; Refill: 3 - Microalbumin / creatinine urine ratio - insulin detemir (LEVEMIR) 100 UNIT/ML injection; Inject 0.48 mLs (48 Units total) into the skin at bedtime.  Dispense: 10 mL; Refill: 3  5. [facility] [place] appt with Dr. JacArdis Hughsuspect got changed due to covid   6. Secondary esophageal varices without bleeding (HCC) -cont current mgt of portal htn  7. Coronary artery disease of native artery of native heart with stable angina pectoris (HCCSteubenno active symptoms, cont bp, lipid and glucose control  Labs/tests ordered:  Lab Orders     Microalbumin / creatinine urine ratio     CBC with Differential/Platelet     COMPLETE METABOLIC PANEL WITH GFR     Lipid panel     Hemoglobin A1c  Next appt:  06/03/2019 med mgt with fasting labs before  Ethylene Reznick L. Giliana Vantil, D.O. [facility]  [address] [id number] Phone (Mon-Fri 8am-5pm):  [phone] Call:  [phone] prompts after 5pm & weekends Office Phone:  [phone] Fax:  [phone]

## 2019-02-01 LAB — MICROALBUMIN / CREATININE URINE RATIO
Creatinine, Urine: 104 mg/dL (ref 20–275)
Microalb Creat Ratio: 14 mcg/mg creat (ref <30)
Microalb, Ur: 1.5 mg/dL

## 2019-02-18 ENCOUNTER — Other Ambulatory Visit: Payer: Self-pay | Admitting: *Deleted

## 2019-02-18 DIAGNOSIS — Z794 Long term (current) use of insulin: Secondary | ICD-10-CM

## 2019-02-18 DIAGNOSIS — E1165 Type 2 diabetes mellitus with hyperglycemia: Secondary | ICD-10-CM

## 2019-02-18 MED ORDER — INSULIN DETEMIR 100 UNIT/ML ~~LOC~~ SOLN: 48.0000 [IU] | Freq: Every day | SUBCUTANEOUS | 3 refills | Status: DC

## 2019-02-18 NOTE — Telephone Encounter (Signed)
[place]

## 2019-02-19 ENCOUNTER — Other Ambulatory Visit: Payer: Self-pay | Admitting: *Deleted

## 2019-02-19 MED ORDER — NOVOLOG FLEXPEN 100 UNIT/ML ~~LOC~~ SOPN: PEN_INJECTOR | SUBCUTANEOUS | 3 refills | Status: DC

## 2019-02-19 NOTE — Telephone Encounter (Signed)
[place]

## 2019-02-28 ENCOUNTER — Other Ambulatory Visit: Payer: Self-pay

## 2019-02-28 DIAGNOSIS — K7460 Unspecified cirrhosis of liver: Secondary | ICD-10-CM

## 2019-02-28 MED ORDER — NADOLOL 40 MG PO TABS: 40.0000 mg | ORAL_TABLET | Freq: Every day | ORAL | 1 refills | Status: DC

## 2019-03-11 ENCOUNTER — Other Ambulatory Visit: Payer: Self-pay

## 2019-03-11 ENCOUNTER — Other Ambulatory Visit: Payer: Self-pay | Admitting: *Deleted

## 2019-03-11 ENCOUNTER — Encounter: Payer: Self-pay | Admitting: Gastroenterology

## 2019-03-11 ENCOUNTER — Other Ambulatory Visit (INDEPENDENT_AMBULATORY_CARE_PROVIDER_SITE_OTHER): Payer: Medicare Other

## 2019-03-11 ENCOUNTER — Ambulatory Visit (INDEPENDENT_AMBULATORY_CARE_PROVIDER_SITE_OTHER): Payer: Medicare Other | Admitting: Gastroenterology

## 2019-03-11 VITALS — BP 110/68 | HR 52 | Temp 97.7°F | Ht 63.0 in | Wt 146.5 lb

## 2019-03-11 DIAGNOSIS — R1310 Dysphagia, unspecified: Secondary | ICD-10-CM | POA: Diagnosis not present

## 2019-03-11 DIAGNOSIS — K7460 Unspecified cirrhosis of liver: Secondary | ICD-10-CM

## 2019-03-11 DIAGNOSIS — I25810 Atherosclerosis of coronary artery bypass graft(s) without angina pectoris: Secondary | ICD-10-CM | POA: Diagnosis not present

## 2019-03-11 DIAGNOSIS — C220 Liver cell carcinoma: Secondary | ICD-10-CM

## 2019-03-11 DIAGNOSIS — E876 Hypokalemia: Secondary | ICD-10-CM

## 2019-03-11 LAB — COMPREHENSIVE METABOLIC PANEL
ALT: 55 U/L — ABNORMAL HIGH (ref 0–35)
AST: 59 U/L — ABNORMAL HIGH (ref 0–37)
Albumin: 3.5 g/dL (ref 3.5–5.2)
Alkaline Phosphatase: 111 U/L (ref 39–117)
BUN: 26 mg/dL — ABNORMAL HIGH (ref 6–23)
CO2: 26 mEq/L (ref 19–32)
Calcium: 9.8 mg/dL (ref 8.4–10.5)
Chloride: 105 mEq/L (ref 96–112)
Creatinine, Ser: 1.04 mg/dL (ref 0.40–1.20)
GFR: 52.05 mL/min — ABNORMAL LOW (ref >60.00)
Glucose, Bld: 66 mg/dL — ABNORMAL LOW (ref 70–99)
Potassium: 4.1 mEq/L (ref 3.5–5.1)
Sodium: 140 mEq/L (ref 135–145)
Total Bilirubin: 0.9 mg/dL (ref 0.2–1.2)
Total Protein: 7.5 g/dL (ref 6.0–8.3)

## 2019-03-11 LAB — CBC
HCT: 43 % (ref 36.0–46.0)
Hemoglobin: 14.5 g/dL (ref 12.0–15.0)
MCHC: 33.7 g/dL (ref 30.0–36.0)
MCV: 93.6 fl (ref 78.0–100.0)
Platelets: 129 10*3/uL — ABNORMAL LOW (ref 150.0–400.0)
RBC: 4.6 Mil/uL (ref 3.87–5.11)
RDW: 13.8 % (ref 11.5–15.5)
WBC: 5.5 10*3/uL (ref 4.0–10.5)

## 2019-03-11 LAB — PROTIME-INR
INR: 1.1 ratio — ABNORMAL HIGH (ref 0.8–1.0)
Prothrombin Time: 12.3 s (ref 9.6–13.1)

## 2019-03-11 MED ORDER — POTASSIUM CHLORIDE CRYS ER 20 MEQ PO TBCR: 20.0000 meq | EXTENDED_RELEASE_TABLET | Freq: Every day | ORAL | 3 refills | Status: DC

## 2019-03-11 NOTE — H&P (View-Only) (Signed)
Review of pertinent gastrointestinal problems: 1. Personal history of adenomatous colon polyps. Colonoscopy Dr. [name] 2012 August was normal except for 2 subcentimeter polyps, one was a tubular adenoma. She was recommended to have recall colonoscopy at five-year interval. She had also had previous colonoscopies with Dr. Lajoyce Corners and found to have adenomas as well.Colonoscopy Dr. [name] 07/2016 one subCM TA, recall at 5 years 2. Cirrhosis: likely from Fatty liver  Paracentesis 11/20174 liters, elevated SAAG, no SBP, cytology neg for malignancy  Most recent imaging:03/2017 [place]: cirrhosis, splenomegaly, NEW partial (non-occlusive) PV thrombus, Gallstones and new "mild dilation of intra and extrahepatic bile ducts"; MRI 03/2017 biliary findings probably from extensive periportal fibrosis, small gallstones in GB, non-occlusive PV thrombus appears chronic.  [place] 10/2017: gallstones, thick GB, cirrhosis without masses in liver.  04/2018 ultrasound cirrhosis without focal hepatic lesions.  Most recent AFP:04/2018 normal  MELD-NA; 04/2018 labs : 10  Most recent EGD:07/2016 EGD: large varices, + portal gastropathy; started nadolol after discussion with cardiology(nadolol 50m once dialy); no recall necessary since on Nadolol.  Immunization for Hep A/B:Started 06/2016  PV thrombus 2018 imaging, non-occlusive and chronic per MR;hematology workup and I agreed; no plans for anticoag (given large varices)   HPI: This is a very pleasant 73year old woman who is here with her husband today.  I last saw her about a year ago at the time of December 2019 routine follow-up for her cirrhosis visit.  She is here again for routine cirrhosis follow-up but she did have a couple specific issues she also wanted to discuss.  First of all she is having minor difficulty swallowing large pills and dry bread.  This has been happening "for a while" it is not progressive.  She does not have significantly worse heartburn lately.   She has had no unintentional weight loss.  She has no troubles with liquids  She has also had some mild upper abdominal discomforts that are crampy in nature, they are brief, they are always improved when she moves her bowels.  These do not happen daily.  She does not see any overt GI bleeding.  No obvious encephalopathic episodes, no overt GI bleeding.   Weight 04/2018 155 pounds, weight today 146 pounds   ROS: complete GI ROS as described in HPI, all other review negative.  Constitutional:  No unintentional weight loss   Past Medical History:  Diagnosis Date  . Allergy   . Arthritis    neck  . Cataract    bilateral - MD monitoring cataracts  . CHF (congestive heart failure) ([place]   . Chronic kidney disease, stage I    DR OTTELIN  HX UTIS  . Cirrhosis ([place] Junta [place]   . Cramp of limb   . Diabetes mellitus   . Dysphagia, unspecified(787.20)   . Dysuria   . Epistaxis   . GERD (gastroesophageal reflux disease)   . Heart murmur    NO CARDIOLOGIST  DX FOR YEARS ASYMPTOMATIC  . Lumbago   . Neoplasm of uncertain behavior of skin   . Nonspecific elevation of levels of transaminase or lactic acid dehydrogenase (LDH)   . Osteoarthrosis, unspecified whether generalized or localized, unspecified site   . Other and unspecified hyperlipidemia    diet controlled  . Pain in joint, shoulder region   . Paresthesias 04/01/2015  . Postablative ovarian failure   . Trochanteric bursitis of left hip 12/15/2015  . Type 2 diabetes mellitus without complication ([place]   . Unspecified essential hypertension    no meds  Past Surgical History:  Procedure Laterality Date  . BREAST BIOPSY    . CARDIAC CATHETERIZATION N/A 01/27/2016   Procedure: Left Heart Cath and Coronary Angiography;  Surgeon: Belva Crome, MD;  Location: [place] [facility];  Service: Cardiovascular;  Laterality: N/A;  . COLONOSCOPY  2012   Devin Foskey - polyp  . COLONOSCOPY WITH PROPOFOL N/A 07/07/2016   Procedure: COLONOSCOPY WITH  PROPOFOL;  Surgeon: [name], MD;  [facility];  Service: Endoscopy;  Laterality: N/A;  . CORONARY ARTERY BYPASS GRAFT N/A 01/28/2016   Procedure: CORONARY ARTERY BYPASS GRAFTING (CABG) x 3 USING RIGHT LEG GREATER SAPHENOUS VEIN GRAFT;  Surgeon: Melrose Nakayama, MD;  Location: [place];  Service: Open Heart Surgery;  Laterality: N/A;  . ENDOVEIN HARVEST OF GREATER SAPHENOUS VEIN Right 01/28/2016   Procedure: ENDOVEIN HARVEST OF GREATER SAPHENOUS VEIN;  Surgeon: Melrose Nakayama, MD;  Location: [place];  Service: Open Heart Surgery;  Laterality: Right;  . ESOPHAGOGASTRODUODENOSCOPY (EGD) WITH PROPOFOL N/A 07/07/2016   Procedure: ESOPHAGOGASTRODUODENOSCOPY (EGD) WITH PROPOFOL;  Surgeon: [name], MD;  [facility];  Service: Endoscopy;  Laterality: N/A;  . MAXIMUM ACCESS (MAS)POSTERIOR LUMBAR INTERBODY FUSION (PLIF) 1 LEVEL Left 06/10/2015   Procedure: FOR MAXIMUM ACCESS (MAS) POSTERIOR LUMBAR INTERBODY FUSION (PLIF) LUMBAR THREE-FOUR EXTRAFORAMINAL MICRODISCECTOMY LUMBAR FIVE-SACRAL ONE LEFT;  Surgeon: Eustace Moore, MD;  Location: [place] [facility];  Service: Neurosurgery;  Laterality: Left;  . TEE WITHOUT CARDIOVERSION N/A 01/28/2016   Procedure: TRANSESOPHAGEAL ECHOCARDIOGRAM (TEE);  Surgeon: Melrose Nakayama, MD;  Location: [place];  Service: Open Heart Surgery;  Laterality: N/A;  . TUBAL LIGATION  1982   Dr Connye Burkitt  . UPPER GASTROINTESTINAL ENDOSCOPY    . VAGINAL HYSTERECTOMY  1997   Dr Rande Lawman    Current Outpatient Medications  Medication Sig Dispense Refill  . acetaminophen (TYLENOL) 500 MG tablet Take 500 mg by mouth at bedtime as needed for mild pain.    . Aromatic Inhalants (VICKS VAPOR IN) Vicks Vapor Rub apply small amount to outside of nose to help breathing    . aspirin EC 81 MG tablet Take 1 tablet (81 mg total) by mouth daily. 90 tablet 3  . BD PEN NEEDLE NANO U/F 32G X 4 MM MISC USE THREE TIMES DAILY AS DIRECTED 100 each 6  . Biotin 10000 MCG TABS  Take 10,000 mcg by mouth every morning.    . calcium carbonate (OS-CAL) 600 MG TABS Take 600 mg by mouth 2 (two) times daily with a meal.      . cholecalciferol (VITAMIN D) 1000 UNITS tablet Take 2,000 Units by mouth daily.     . Cyanocobalamin (VITAMIN B 12 PO) Take 1,000 mcg by mouth daily.      . empagliflozin (JARDIANCE) 25 MG TABS tablet Take 25 mg by mouth daily. 30 tablet 3  . ezetimibe (ZETIA) 10 MG tablet TAKE 1 TABLET(10 MG) BY MOUTH DAILY 90 tablet 1  . furosemide (LASIX) 40 MG tablet TAKE 1 TABLET(40 MG) BY MOUTH DAILY 90 tablet 3  . glucose blood test strip One Touch Ultra II strips. Use to test blood sugar three times daily. Dx: E11.65 300 each 3  . insulin detemir (LEVEMIR) 100 UNIT/ML injection Inject 0.48 mLs (48 Units total) into the skin at bedtime. 10 mL 3  . Insulin Syringe-Needle U-100 (INSULIN SYRINGE 1CC/31GX5/16") 31G X 5/16" 1 ML MISC USE AS DIRECTED DAILY WITH LEVEMIR 100 each 2  . loratadine (CLARITIN) 10 MG tablet  Take 10 mg by mouth daily as needed for allergies.    [name] Kitchen MAGNESIUM PO Take 500 mg by mouth 2 (two) times daily in the am and at bedtime..    . Multiple Vitamins-Minerals (MULTIVITAMIN WITH MINERALS) tablet Take 1 tablet by mouth daily.      . nadolol (CORGARD) 40 MG tablet Take 1 tablet (40 mg total) by mouth daily. 90 tablet 1  . NOVOLOG FLEXPEN 100 UNIT/ML FlexPen Inject 12 units under the skin every morning, 8 units at lunch and 12 units at supper 15 mL 3  . omeprazole (PRILOSEC) 20 MG capsule TAKE 1 CAPSULE BY MOUTH DAILY 90 capsule 1  . Polyethyl Glycol-Propyl Glycol (SYSTANE OP) Place 1 drop into both eyes 2 (two) times daily.    . potassium chloride SA (K-DUR) 20 MEQ tablet TAKE 1 TABLET BY MOUTH EVERY DAY 90 tablet 0  . Probiotic Product (PROBIOTIC DAILY PO) Take 1 capsule by mouth daily. Digestive Advantage Probiotic    . spironolactone (ALDACTONE) 100 MG tablet TAKE 1 TABLET(100 MG) BY MOUTH TWICE DAILY 180 tablet 1   No current  facility-administered medications for this visit.     Allergies as of 03/11/2019 - Review Complete 03/11/2019  Allergen Reaction Noted  . Kiwi extract Anaphylaxis 05/28/2015  . Tdap [tetanus-diphth-acell pertussis] Swelling and Other (See Comments) 03/27/2013  . Statins  05/08/2016  . Latex Itching, Dermatitis, and Rash 12/22/2010  . Tramadol Nausea And Vomiting 05/28/2015    Family History  Problem Relation Age of Onset  . Lung cancer Father   . Arthritis Sister   . Arthritis Brother   . Heart disease Maternal Grandmother   . Heart disease Maternal Grandfather   . Heart disease Paternal Grandmother   . Heart disease Paternal Grandfather   . Breast cancer Mother   . Liver cancer Brother   . Breast cancer Maternal Aunt   . Breast cancer Paternal Aunt   . Colon cancer Neg Hx   . Esophageal cancer Neg Hx   . Rectal cancer Neg Hx   . Stomach cancer Neg Hx     Social History   Socioeconomic History  . Marital status: Married    Spouse name: Not on file  . Number of children: 6  . Years of education: Not on file  . Highest education level: Not on file  Occupational History  . Occupation: retired  Scientific laboratory technician  . Financial resource strain: Not hard at all  . Food insecurity    Worry: Never true    Inability: Never true  . Transportation needs    Medical: No    Non-medical: No  Tobacco Use  . Smoking status: Never Smoker  . Smokeless tobacco: Never Used  Substance and Sexual Activity  . Alcohol use: No  . Drug use: No  . Sexual activity: Yes    Partners: Male    Birth control/protection: Post-menopausal, Surgical    Comment: Hysterectomy  Lifestyle  . Physical activity    Days per week: 0 days    Minutes per session: 0 min  . Stress: Not at all  Relationships  . Social connections    Talks on phone: More than three times a week    Gets together: Twice a week    Attends religious service: More than 4 times per year    Active member of club or organization:  No    Attends meetings of clubs or organizations: Never    Relationship status: Married  . Intimate  partner violence    Fear of current or ex partner: No    Emotionally abused: No    Physically abused: No    Forced sexual activity: No  Other Topics Concern  . Not on file  Social History Narrative  . Not on file     Physical Exam: BP 110/68 (BP Location: Left Arm, Patient Position: Sitting, Cuff Size: Normal)   Pulse (!) 52   Temp 97.7 F (36.5 C) (Oral)   Ht 5' 3"  (1.6 m)   Wt 146 lb 8 oz (66.5 kg)   BMI 25.95 kg/m  Constitutional: generally well-appearing Psychiatric: alert and oriented x3 Abdomen: soft, nontender, nondistended, no obvious ascites, no peritoneal signs, normal bowel sounds No peripheral edema noted in lower extremities  Assessment and plan: 73 y.o. female with cirrhosis, mild swallowing difficulty, abdominal discomforts.  First she is due for routine cirrhosis follow-up testing including labs to recalculate her meld score as well as alpha-fetoprotein and ultrasound for hepatoma screening.  She has no edema in her ankles, no history of any encephalopathic episodes.  She is having a little bit of difficulty with large pills and dry breads.  This is been going on "for a while".  I recommended we start with barium esophagram.  I will be reluctant certainly to proceed with any kind of dilating procedures given her known distal esophagus varices.  Her intermittent abdominal pains are functional in nature.  They are always improved when she moves her bowels.  I offered her antispasmodic medicine however she declined.  She is really not too bothered by these discomforts.    She will return to see me in 6 months and sooner if needed.  Please see the "Patient Instructions" section for addition details about the plan.  Owens Loffler, MD [facility] 03/11/2019, 9:06 AM

## 2019-03-11 NOTE — Progress Notes (Signed)
Review of pertinent gastrointestinal problems: 1. Personal history of adenomatous colon polyps. Colonoscopy Dr. [name] 2012 August was normal except for 2 subcentimeter polyps, one was a tubular adenoma. She was recommended to have recall colonoscopy at five-year interval. She had also had previous colonoscopies with Dr. Lajoyce Corners and found to have adenomas as well.Colonoscopy Dr. [name] 07/2016 one subCM TA, recall at 5 years 2. Cirrhosis: likely from Fatty liver  Paracentesis 11/20174 liters, elevated SAAG, no SBP, cytology neg for malignancy  Most recent imaging:03/2017 [place]: cirrhosis, splenomegaly, NEW partial (non-occlusive) PV thrombus, Gallstones and new "mild dilation of intra and extrahepatic bile ducts"; MRI 03/2017 biliary findings probably from extensive periportal fibrosis, small gallstones in GB, non-occlusive PV thrombus appears chronic.  [place] 10/2017: gallstones, thick GB, cirrhosis without masses in liver.  04/2018 ultrasound cirrhosis without focal hepatic lesions.  Most recent AFP:04/2018 normal  MELD-NA; 04/2018 labs : 10  Most recent EGD:07/2016 EGD: large varices, + portal gastropathy; started nadolol after discussion with cardiology(nadolol 23m once dialy); no recall necessary since on Nadolol.  Immunization for Hep A/B:Started 06/2016  PV thrombus 2018 imaging, non-occlusive and chronic per MR;hematology workup and I agreed; no plans for anticoag (given large varices)   HPI: This is a very pleasant 73year old woman who is here with her husband today.  I last saw her about a year ago at the time of December 2019 routine follow-up for her cirrhosis visit.  She is here again for routine cirrhosis follow-up but she did have a couple specific issues she also wanted to discuss.  First of all she is having minor difficulty swallowing large pills and dry bread.  This has been happening "for a while" it is not progressive.  She does not have significantly worse heartburn lately.   She has had no unintentional weight loss.  She has no troubles with liquids  She has also had some mild upper abdominal discomforts that are crampy in nature, they are brief, they are always improved when she moves her bowels.  These do not happen daily.  She does not see any overt GI bleeding.  No obvious encephalopathic episodes, no overt GI bleeding.   Weight 04/2018 155 pounds, weight today 146 pounds   ROS: complete GI ROS as described in HPI, all other review negative.  Constitutional:  No unintentional weight loss   Past Medical History:  Diagnosis Date  . Allergy   . Arthritis    neck  . Cataract    bilateral - MD monitoring cataracts  . CHF (congestive heart failure) ([place]   . Chronic kidney disease, stage I    DR OTTELIN  HX UTIS  . Cirrhosis ([place]   . Cramp of limb   . Diabetes mellitus   . Dysphagia, unspecified(787.20)   . Dysuria   . Epistaxis   . GERD (gastroesophageal reflux disease)   . Heart murmur    NO CARDIOLOGIST  DX FOR YEARS ASYMPTOMATIC  . Lumbago   . Neoplasm of uncertain behavior of skin   . Nonspecific elevation of levels of transaminase or lactic acid dehydrogenase (LDH)   . Osteoarthrosis, unspecified whether generalized or localized, unspecified site   . Other and unspecified hyperlipidemia    diet controlled  . Pain in joint, shoulder region   . Paresthesias 04/01/2015  . Postablative ovarian failure   . Trochanteric bursitis of left hip 12/15/2015  . Type 2 diabetes mellitus without complication ([place]   . Unspecified essential hypertension    no meds  Past Surgical History:  Procedure Laterality Date  . BREAST BIOPSY    . CARDIAC CATHETERIZATION N/A 01/27/2016   Procedure: Left Heart Cath and Coronary Angiography;  Surgeon: Belva Crome, MD;  Location: [facility];  Service: Cardiovascular;  Laterality: N/A;  . COLONOSCOPY  2012   [facility] - polyp  . COLONOSCOPY WITH PROPOFOL N/A 07/07/2016   Procedure: COLONOSCOPY WITH  PROPOFOL;  Surgeon: [name], MD;  Location: [facility];  Service: Endoscopy;  Laterality: N/A;  . CORONARY ARTERY BYPASS GRAFT N/A 01/28/2016   Procedure: CORONARY ARTERY BYPASS GRAFTING (CABG) x 3 USING RIGHT LEG GREATER SAPHENOUS VEIN GRAFT;  Surgeon: Melrose Nakayama, MD;  Location: [facility];  Service: Open Heart Surgery;  Laterality: N/A;  . ENDOVEIN HARVEST OF GREATER SAPHENOUS VEIN Right 01/28/2016   Procedure: ENDOVEIN HARVEST OF GREATER SAPHENOUS VEIN;  Surgeon: Melrose Nakayama, MD;  Location: [facility];  Service: Open Heart Surgery;  Laterality: Right;  . ESOPHAGOGASTRODUODENOSCOPY (EGD) WITH PROPOFOL N/A 07/07/2016   Procedure: ESOPHAGOGASTRODUODENOSCOPY (EGD) WITH PROPOFOL;  Surgeon: [name], MD;  Location: [facility];  Service: Endoscopy;  Laterality: N/A;  . MAXIMUM ACCESS (MAS)POSTERIOR LUMBAR INTERBODY FUSION (PLIF) 1 LEVEL Left 06/10/2015   Procedure: FOR MAXIMUM ACCESS (MAS) POSTERIOR LUMBAR INTERBODY FUSION (PLIF) LUMBAR THREE-FOUR EXTRAFORAMINAL MICRODISCECTOMY LUMBAR FIVE-SACRAL ONE LEFT;  Surgeon: Eustace Moore, MD;  Location: [facility];  Service: Neurosurgery;  Laterality: Left;  . TEE WITHOUT CARDIOVERSION N/A 01/28/2016   Procedure: TRANSESOPHAGEAL ECHOCARDIOGRAM (TEE);  Surgeon: Melrose Nakayama, MD;  Location: [facility];  Service: Open Heart Surgery;  Laterality: N/A;  . TUBAL LIGATION  1982   Dr Connye Burkitt  . UPPER GASTROINTESTINAL ENDOSCOPY    . VAGINAL HYSTERECTOMY  1997   Dr Rande Lawman    Current Outpatient Medications  Medication Sig Dispense Refill  . acetaminophen (TYLENOL) 500 MG tablet Take 500 mg by mouth at bedtime as needed for mild pain.    . Aromatic Inhalants (VICKS VAPOR IN) Vicks Vapor Rub apply small amount to outside of nose to help breathing    . aspirin EC 81 MG tablet Take 1 tablet (81 mg total) by mouth daily. 90 tablet 3  . BD PEN NEEDLE NANO U/F 32G X 4 MM MISC USE THREE TIMES DAILY AS DIRECTED 100 each 6  . Biotin 10000 MCG TABS  Take 10,000 mcg by mouth every morning.    . calcium carbonate (OS-CAL) 600 MG TABS Take 600 mg by mouth 2 (two) times daily with a meal.      . cholecalciferol (VITAMIN D) 1000 UNITS tablet Take 2,000 Units by mouth daily.     . Cyanocobalamin (VITAMIN B 12 PO) Take 1,000 mcg by mouth daily.      . empagliflozin (JARDIANCE) 25 MG TABS tablet Take 25 mg by mouth daily. 30 tablet 3  . ezetimibe (ZETIA) 10 MG tablet TAKE 1 TABLET(10 MG) BY MOUTH DAILY 90 tablet 1  . furosemide (LASIX) 40 MG tablet TAKE 1 TABLET(40 MG) BY MOUTH DAILY 90 tablet 3  . glucose blood test strip One Touch Ultra II strips. Use to test blood sugar three times daily. Dx: E11.65 300 each 3  . insulin detemir (LEVEMIR) 100 UNIT/ML injection Inject 0.48 mLs (48 Units total) into the skin at bedtime. 10 mL 3  . Insulin Syringe-Needle U-100 (INSULIN SYRINGE 1CC/31GX5/16") 31G X 5/16" 1 ML MISC USE AS DIRECTED DAILY WITH LEVEMIR 100 each 2  . loratadine (CLARITIN) 10 MG tablet  Take 10 mg by mouth daily as needed for allergies.    [name] Kitchen MAGNESIUM PO Take 500 mg by mouth 2 (two) times daily in the am and at bedtime..    . Multiple Vitamins-Minerals (MULTIVITAMIN WITH MINERALS) tablet Take 1 tablet by mouth daily.      . nadolol (CORGARD) 40 MG tablet Take 1 tablet (40 mg total) by mouth daily. 90 tablet 1  . NOVOLOG FLEXPEN 100 UNIT/ML FlexPen Inject 12 units under the skin every morning, 8 units at lunch and 12 units at supper 15 mL 3  . omeprazole (PRILOSEC) 20 MG capsule TAKE 1 CAPSULE BY MOUTH DAILY 90 capsule 1  . Polyethyl Glycol-Propyl Glycol (SYSTANE OP) Place 1 drop into both eyes 2 (two) times daily.    . potassium chloride SA (K-DUR) 20 MEQ tablet TAKE 1 TABLET BY MOUTH EVERY DAY 90 tablet 0  . Probiotic Product (PROBIOTIC DAILY PO) Take 1 capsule by mouth daily. Digestive Advantage Probiotic    . spironolactone (ALDACTONE) 100 MG tablet TAKE 1 TABLET(100 MG) BY MOUTH TWICE DAILY 180 tablet 1   No current  facility-administered medications for this visit.     Allergies as of 03/11/2019 - Review Complete 03/11/2019  Allergen Reaction Noted  . Kiwi extract Anaphylaxis 05/28/2015  . Tdap [tetanus-diphth-acell pertussis] Swelling and Other (See Comments) 03/27/2013  . Statins  05/08/2016  . Latex Itching, Dermatitis, and Rash 12/22/2010  . Tramadol Nausea And Vomiting 05/28/2015    Family History  Problem Relation Age of Onset  . Lung cancer Father   . Arthritis Sister   . Arthritis Brother   . Heart disease Maternal Grandmother   . Heart disease Maternal Grandfather   . Heart disease Paternal Grandmother   . Heart disease Paternal Grandfather   . Breast cancer Mother   . Liver cancer Brother   . Breast cancer Maternal Aunt   . Breast cancer Paternal Aunt   . Colon cancer Neg Hx   . Esophageal cancer Neg Hx   . Rectal cancer Neg Hx   . Stomach cancer Neg Hx     Social History   Socioeconomic History  . Marital status: Married    Spouse name: Not on file  . Number of children: 6  . Years of education: Not on file  . Highest education level: Not on file  Occupational History  . Occupation: retired  Scientific laboratory technician  . Financial resource strain: Not hard at all  . Food insecurity    Worry: Never true    Inability: Never true  . Transportation needs    Medical: No    Non-medical: No  Tobacco Use  . Smoking status: Never Smoker  . Smokeless tobacco: Never Used  Substance and Sexual Activity  . Alcohol use: No  . Drug use: No  . Sexual activity: Yes    Partners: Male    Birth control/protection: Post-menopausal, Surgical    Comment: Hysterectomy  Lifestyle  . Physical activity    Days per week: 0 days    Minutes per session: 0 min  . Stress: Not at all  Relationships  . Social connections    Talks on phone: More than three times a week    Gets together: Twice a week    Attends religious service: More than 4 times per year    Active member of club or organization:  No    Attends meetings of clubs or organizations: Never    Relationship status: Married  . Intimate  partner violence    Fear of current or ex partner: No    Emotionally abused: No    Physically abused: No    Forced sexual activity: No  Other Topics Concern  . Not on file  Social History Narrative  . Not on file     Physical Exam: BP 110/68 (BP Location: Left Arm, Patient Position: Sitting, Cuff Size: Normal)   Pulse (!) 52   Temp 97.7 F (36.5 C) (Oral)   Ht 5' 3"  (1.6 m)   Wt 146 lb 8 oz (66.5 kg)   BMI 25.95 kg/m  Constitutional: generally well-appearing Psychiatric: alert and oriented x3 Abdomen: soft, nontender, nondistended, no obvious ascites, no peritoneal signs, normal bowel sounds No peripheral edema noted in lower extremities  Assessment and plan: 73 y.o. female with cirrhosis, mild swallowing difficulty, abdominal discomforts.  First she is due for routine cirrhosis follow-up testing including labs to recalculate her meld score as well as alpha-fetoprotein and ultrasound for hepatoma screening.  She has no edema in her ankles, no history of any encephalopathic episodes.  She is having a little bit of difficulty with large pills and dry breads.  This is been going on "for a while".  I recommended we start with barium esophagram.  I will be reluctant certainly to proceed with any kind of dilating procedures given her known distal esophagus varices.  Her intermittent abdominal pains are functional in nature.  They are always improved when she moves her bowels.  I offered her antispasmodic medicine however she declined.  She is really not too bothered by these discomforts.    She will return to see me in 6 months and sooner if needed.  Please see the "Patient Instructions" section for addition details about the plan.  Owens Loffler, MD [facility] 03/11/2019, 9:06 AM

## 2019-03-11 NOTE — Telephone Encounter (Signed)
[place] Pended Rx and sent to Dr. Mariea Clonts for approval due to [place].

## 2019-03-11 NOTE — Patient Instructions (Signed)
You have been scheduled for an abdominal ultrasound at [facility] (1st floor of hospital) on 03-18-2019 at 8:30 am. Please arrive 15 minutes prior to your appointment for registration. Make certain not to have anything to eat or drink 6 hours prior to your appointment. Should you need to reschedule your appointment, please contact radiology at [phone]. This test typically takes about 30 minutes to perform.  Your provider has requested that you go to the basement level for lab work before leaving today. Press "B" on the elevator. The lab is located at the first door on the left as you exit the elevator.  You have been scheduled for a Barium Esophogram at [facility] (1st floor of the hospital) on 03-18-2019. Please arrive 15 minutes prior to your appointment for registration. Make certain not to have anything to eat or drink 3 hours prior to your test. If you need to reschedule for any reason, please contact radiology at [phone] to do so. __________________________________________________________________ A barium swallow is an examination that concentrates on views of the esophagus. This tends to be a double contrast exam (barium and two liquids which, when combined, create a gas to distend the wall of the oesophagus) or single contrast (non-ionic iodine based). The study is usually tailored to your symptoms so a good history is essential. Attention is paid during the study to the form, structure and configuration of the esophagus, looking for functional disorders (such as aspiration, dysphagia, achalasia, motility and reflux) EXAMINATION You may be asked to change into a gown, depending on the type of swallow being performed. A radiologist and radiographer will perform the procedure. The radiologist will advise you of the type of contrast selected for your procedure and direct you during the exam. You will be asked to stand, sit or lie in several different positions and to  hold a small amount of fluid in your mouth before being asked to swallow while the imaging is performed .In some instances you may be asked to swallow barium coated marshmallows to assess the motility of a solid food bolus. The exam can be recorded as a digital or video fluoroscopy procedure. POST PROCEDURE It will take 1-2 days for the barium to pass through your system. To facilitate this, it is important, unless otherwise directed, to increase your fluids for the next 24-48hrs and to resume your normal diet.  This test typically takes about 30 minutes to perform. __________________________________________________________________________________

## 2019-03-12 LAB — AFP TUMOR MARKER: AFP-Tumor Marker: 2.3 ng/mL

## 2019-03-18 ENCOUNTER — Other Ambulatory Visit: Payer: Self-pay

## 2019-03-18 ENCOUNTER — Ambulatory Visit (HOSPITAL_COMMUNITY)
Admission: RE | Admit: 2019-03-18 | Discharge: 2019-03-18 | Disposition: A | Discharge status: Home / Self Care | Payer: Medicare Other | Source: Ambulatory Visit | Attending: Gastroenterology | Admitting: Gastroenterology

## 2019-03-18 DIAGNOSIS — R1310 Dysphagia, unspecified: Secondary | ICD-10-CM

## 2019-03-18 DIAGNOSIS — C220 Liver cell carcinoma: Secondary | ICD-10-CM

## 2019-03-18 DIAGNOSIS — K7460 Unspecified cirrhosis of liver: Secondary | ICD-10-CM | POA: Insufficient documentation

## 2019-03-20 ENCOUNTER — Other Ambulatory Visit: Payer: Self-pay

## 2019-03-20 DIAGNOSIS — R1310 Dysphagia, unspecified: Secondary | ICD-10-CM

## 2019-03-20 DIAGNOSIS — I8500 Esophageal varices without bleeding: Secondary | ICD-10-CM

## 2019-03-21 ENCOUNTER — Telehealth: Payer: Self-pay

## 2019-03-21 NOTE — Telephone Encounter (Signed)
Gayland Curry, DO  Oralia Manis, CMA        Please place in my folder in my office and I will address. 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  Thanks!   Previous Messages  ----- Message -----  From: Oralia Manis, CMA  Sent: 03/20/2019  4:35 PM EST  To: Gayland Curry, DO   Have received an incoming fax for Jacqlyn Larsen 13-Jul-2045 from [employer] of Cardiology, American [employer] Association and American Diabetes Association stating that patient should be on a statin form has a except or decline

## 2019-03-25 ENCOUNTER — Other Ambulatory Visit (HOSPITAL_COMMUNITY)
Admission: RE | Admit: 2019-03-25 | Discharge: 2019-03-25 | Disposition: A | Discharge status: Home / Self Care | Payer: Medicare Other | Source: Ambulatory Visit | Attending: Gastroenterology | Admitting: Gastroenterology

## 2019-03-25 DIAGNOSIS — Z01812 Encounter for preprocedural laboratory examination: Secondary | ICD-10-CM | POA: Insufficient documentation

## 2019-03-25 DIAGNOSIS — Z20828 Contact with and (suspected) exposure to other viral communicable diseases: Secondary | ICD-10-CM | POA: Insufficient documentation

## 2019-03-26 LAB — NOVEL CORONAVIRUS, NAA (HOSP ORDER, SEND-OUT TO REF LAB; TAT 18-24 HRS): SARS-CoV-2, NAA: NOT DETECTED

## 2019-03-27 ENCOUNTER — Other Ambulatory Visit: Payer: Self-pay

## 2019-03-27 ENCOUNTER — Encounter (HOSPITAL_COMMUNITY): Payer: Self-pay | Admitting: *Deleted

## 2019-03-28 ENCOUNTER — Encounter (HOSPITAL_COMMUNITY)
Admission: RE | Disposition: A | Discharge status: Home / Self Care | Payer: Self-pay | Source: Home / Self Care | Attending: Gastroenterology

## 2019-03-28 ENCOUNTER — Ambulatory Visit (HOSPITAL_COMMUNITY)
Admission: RE | Admit: 2019-03-28 | Discharge: 2019-03-28 | Disposition: A | Discharge status: Home / Self Care | Payer: Medicare Other | Attending: Gastroenterology | Admitting: Gastroenterology

## 2019-03-28 ENCOUNTER — Ambulatory Visit (HOSPITAL_COMMUNITY): Payer: Medicare Other | Admitting: Certified Registered"

## 2019-03-28 ENCOUNTER — Other Ambulatory Visit: Payer: Self-pay

## 2019-03-28 ENCOUNTER — Encounter (HOSPITAL_COMMUNITY): Payer: Self-pay | Admitting: Gastroenterology

## 2019-03-28 DIAGNOSIS — Z981 Arthrodesis status: Secondary | ICD-10-CM | POA: Insufficient documentation

## 2019-03-28 DIAGNOSIS — Z7982 Long term (current) use of aspirin: Secondary | ICD-10-CM | POA: Diagnosis not present

## 2019-03-28 DIAGNOSIS — M1990 Unspecified osteoarthritis, unspecified site: Secondary | ICD-10-CM | POA: Diagnosis not present

## 2019-03-28 DIAGNOSIS — K7469 Other cirrhosis of liver: Secondary | ICD-10-CM | POA: Insufficient documentation

## 2019-03-28 DIAGNOSIS — Z800 Family history of malignant neoplasm of digestive organs: Secondary | ICD-10-CM | POA: Diagnosis not present

## 2019-03-28 DIAGNOSIS — I8510 Secondary esophageal varices without bleeding: Secondary | ICD-10-CM | POA: Diagnosis not present

## 2019-03-28 DIAGNOSIS — Z794 Long term (current) use of insulin: Secondary | ICD-10-CM | POA: Insufficient documentation

## 2019-03-28 DIAGNOSIS — K219 Gastro-esophageal reflux disease without esophagitis: Secondary | ICD-10-CM | POA: Insufficient documentation

## 2019-03-28 DIAGNOSIS — I509 Heart failure, unspecified: Secondary | ICD-10-CM | POA: Diagnosis not present

## 2019-03-28 DIAGNOSIS — E785 Hyperlipidemia, unspecified: Secondary | ICD-10-CM | POA: Diagnosis not present

## 2019-03-28 DIAGNOSIS — K3189 Other diseases of stomach and duodenum: Secondary | ICD-10-CM | POA: Insufficient documentation

## 2019-03-28 DIAGNOSIS — K766 Portal hypertension: Secondary | ICD-10-CM | POA: Insufficient documentation

## 2019-03-28 DIAGNOSIS — Z951 Presence of aortocoronary bypass graft: Secondary | ICD-10-CM | POA: Insufficient documentation

## 2019-03-28 DIAGNOSIS — Z8261 Family history of arthritis: Secondary | ICD-10-CM | POA: Diagnosis not present

## 2019-03-28 DIAGNOSIS — R1310 Dysphagia, unspecified: Secondary | ICD-10-CM | POA: Diagnosis not present

## 2019-03-28 DIAGNOSIS — Z8249 Family history of ischemic heart disease and other diseases of the circulatory system: Secondary | ICD-10-CM | POA: Insufficient documentation

## 2019-03-28 DIAGNOSIS — Z90710 Acquired absence of both cervix and uterus: Secondary | ICD-10-CM | POA: Insufficient documentation

## 2019-03-28 DIAGNOSIS — E1136 Type 2 diabetes mellitus with diabetic cataract: Secondary | ICD-10-CM | POA: Diagnosis not present

## 2019-03-28 DIAGNOSIS — Z801 Family history of malignant neoplasm of trachea, bronchus and lung: Secondary | ICD-10-CM | POA: Diagnosis not present

## 2019-03-28 DIAGNOSIS — I130 Hypertensive heart and chronic kidney disease with heart failure and stage 1 through stage 4 chronic kidney disease, or unspecified chronic kidney disease: Secondary | ICD-10-CM | POA: Insufficient documentation

## 2019-03-28 DIAGNOSIS — R109 Unspecified abdominal pain: Secondary | ICD-10-CM | POA: Insufficient documentation

## 2019-03-28 DIAGNOSIS — K7460 Unspecified cirrhosis of liver: Secondary | ICD-10-CM | POA: Diagnosis not present

## 2019-03-28 DIAGNOSIS — I2510 Atherosclerotic heart disease of native coronary artery without angina pectoris: Secondary | ICD-10-CM | POA: Insufficient documentation

## 2019-03-28 DIAGNOSIS — I8500 Esophageal varices without bleeding: Secondary | ICD-10-CM

## 2019-03-28 DIAGNOSIS — E1122 Type 2 diabetes mellitus with diabetic chronic kidney disease: Secondary | ICD-10-CM | POA: Diagnosis not present

## 2019-03-28 DIAGNOSIS — N181 Chronic kidney disease, stage 1: Secondary | ICD-10-CM | POA: Insufficient documentation

## 2019-03-28 DIAGNOSIS — Z803 Family history of malignant neoplasm of breast: Secondary | ICD-10-CM | POA: Insufficient documentation

## 2019-03-28 DIAGNOSIS — Z79899 Other long term (current) drug therapy: Secondary | ICD-10-CM | POA: Insufficient documentation

## 2019-03-28 HISTORY — PX: ESOPHAGOGASTRODUODENOSCOPY (EGD) WITH PROPOFOL: SHX5813

## 2019-03-28 HISTORY — PX: ESOPHAGEAL BANDING: SHX5518

## 2019-03-28 LAB — GLUCOSE, CAPILLARY: Glucose-Capillary: 228 mg/dL — ABNORMAL HIGH (ref 70–99)

## 2019-03-28 SURGERY — ESOPHAGOGASTRODUODENOSCOPY (EGD) WITH PROPOFOL
Anesthesia: Monitor Anesthesia Care

## 2019-03-28 MED ORDER — SODIUM CHLORIDE 0.9 % IV SOLN
INTRAVENOUS | Status: DC
  Administered 2019-03-28: 500 mL via INTRAVENOUS

## 2019-03-28 MED ORDER — FENTANYL CITRATE (PF) 100 MCG/2ML IJ SOLN
25.0000 ug | Freq: Once | INTRAMUSCULAR | Status: AC
  Administered 2019-03-28: 25 ug via INTRAVENOUS

## 2019-03-28 MED ORDER — SIMETHICONE 40 MG/0.6ML PO SUSP
ORAL | Status: AC
  Filled 2019-03-28: qty 0.6

## 2019-03-28 MED ORDER — ONDANSETRON HCL 4 MG/2ML IJ SOLN
4.0000 mg | Freq: Once | INTRAMUSCULAR | Status: AC
  Administered 2019-03-28: 4 mg via INTRAVENOUS

## 2019-03-28 MED ORDER — LIDOCAINE 2% (20 MG/ML) 5 ML SYRINGE
INTRAMUSCULAR | Status: DC | PRN
  Administered 2019-03-28: 50 mg via INTRAVENOUS

## 2019-03-28 MED ORDER — EPHEDRINE SULFATE-NACL 50-0.9 MG/10ML-% IV SOSY
PREFILLED_SYRINGE | INTRAVENOUS | Status: DC | PRN
  Administered 2019-03-28 (×2): 5 mg via INTRAVENOUS
  Administered 2019-03-28: 10 mg via INTRAVENOUS
  Administered 2019-03-28: 5 mg via INTRAVENOUS

## 2019-03-28 MED ORDER — PROPOFOL 10 MG/ML IV BOLUS
INTRAVENOUS | Status: AC
  Filled 2019-03-28: qty 20

## 2019-03-28 MED ORDER — PROPOFOL 500 MG/50ML IV EMUL
INTRAVENOUS | Status: DC | PRN
  Administered 2019-03-28: 125 ug/kg/min via INTRAVENOUS

## 2019-03-28 MED ORDER — ONDANSETRON HCL 4 MG/2ML IJ SOLN
INTRAMUSCULAR | Status: AC
  Filled 2019-03-28: qty 2

## 2019-03-28 MED ORDER — PROPOFOL 500 MG/50ML IV EMUL
INTRAVENOUS | Status: AC
  Filled 2019-03-28: qty 50

## 2019-03-28 MED ORDER — PROPOFOL 10 MG/ML IV BOLUS
INTRAVENOUS | Status: DC | PRN
  Administered 2019-03-28: 20 mg via INTRAVENOUS

## 2019-03-28 MED ORDER — HYDROCODONE-ACETAMINOPHEN 7.5-325 MG/15ML PO SOLN: 10.0000 mL | ORAL | Status: DC | PRN

## 2019-03-28 MED ORDER — FENTANYL CITRATE (PF) 100 MCG/2ML IJ SOLN
INTRAMUSCULAR | Status: AC
  Filled 2019-03-28: qty 2

## 2019-03-28 MED ORDER — SIMETHICONE 40 MG/0.6ML PO SUSP
40.0000 mg | Freq: Once | ORAL | Status: AC
  Administered 2019-03-28: 40 mg via ORAL

## 2019-03-28 SURGICAL SUPPLY — 14 items

## 2019-03-28 NOTE — Op Note (Signed)
[facility] Patient Name: [name] Procedure Date: 03/28/2019 MRN: [id number] Attending MD: [name] , MD Date of Birth: 1945/06/11 CSN: [id number] Age: 73 Admit Type: Outpatient Procedure:                Upper GI endoscopy Indications:              Dysphagia Providers:                [name], MD, Cleda Daub, RN, Lina Sar, Technician, Jefm Miles CRNA Referring MD:              Medicines:                Monitored Anesthesia Care Complications:            No immediate complications. Estimated blood loss:                            None. Estimated Blood Loss:     Estimated blood loss: none. Procedure:                Pre-Anesthesia Assessment:                           - Prior to the procedure, a History and Physical                            was performed, and patient medications and                            allergies were reviewed. The patient's tolerance of                            previous anesthesia was also reviewed. The risks                            and benefits of the procedure and the sedation                            options and risks were discussed with the patient.                            All questions were answered, and informed consent                            was obtained. Prior Anticoagulants: The patient has                            taken no previous anticoagulant or antiplatelet                            agents. ASA Grade Assessment: III - A patient with  severe systemic disease. After reviewing the risks                            and benefits, the patient was deemed in                            satisfactory condition to undergo the procedure.                           After obtaining informed consent, the endoscope was                            passed under direct vision. Throughout the                            procedure, the patient's blood  pressure, pulse, and                            oxygen saturations were monitored continuously. The                            [id number]-[id number] ([id number]) Olympus gastroscope was                            introduced through the mouth, and advanced to the                            second part of duodenum. The upper GI endoscopy was                            accomplished without difficulty. The patient                            tolerated the procedure well. Scope In: Scope Out: Findings:      Grade III varices were found in the mid esophagus and in the distal       esophagus without signs of recent bleeding. Fourteen bands were       successfully placed with complete eradication, resulting in deflation of       varices. There was no bleeding during the maneuver.      Moderate portal gastropathy changes throughout the stomach.      No gastric varices.      The exam was otherwise without abnormality. Impression:               - Large esophageal varices despite 2 years of max                            dose nadolol (52m daily, HR in 50s), very likely                            contribute to your dysphagia. These were treated                            with placement of 14 variceal ligating bands  today.                           - Portal gastropathy. Moderate Sedation:      Not Applicable - Patient had care per Anesthesia. Recommendation:           - Patient has a contact number available for                            emergencies. The signs and symptoms of potential                            delayed complications were discussed with the                            patient. Return to normal activities tomorrow.                            Written discharge instructions were provided to the                            patient.                           - Liquid diet for 48 hours (ensure, boost, broth)                            and then soft foods only for 5 more days (mashed                             potato consistency), then OK to resume regular diet.                           - Continue present medications. Stay on nadolol                            71m daily.                           - Will repeat EGD to continue eradication of                            varices in 4-6 weeks, [facility] will schedule. Procedure Code(s):        --- Professional ---                           [phone] Esophagogastroduodenoscopy, flexible,                            transoral; with band ligation of esophageal/gastric                            varices Diagnosis Code(s):        --- Professional ---  I85.00, Esophageal varices without bleeding                           R13.10, Dysphagia, unspecified CPT copyright 2019 American Medical Association. All rights reserved. The codes documented in this report are preliminary and upon coder review may  be revised to meet current compliance requirements. [name], MD 03/28/2019 12:43:37 PM This report has been signed electronically. Number of Addenda: 0

## 2019-03-28 NOTE — Anesthesia Procedure Notes (Signed)
Procedure Name: MAC Date/Time: 03/28/2019 12:07 PM Performed by: Eben Burow, CRNA Pre-anesthesia Checklist: Patient identified, Emergency Drugs available, Suction available, Patient being monitored and Timeout performed Oxygen Delivery Method: Nasal cannula Dental Injury: Teeth and Oropharynx as per pre-operative assessment

## 2019-03-28 NOTE — Discharge Instructions (Signed)
YOU HAD AN ENDOSCOPIC PROCEDURE TODAY: Refer to the procedure report and other information in the discharge instructions given to you for any specific questions about what was found during the examination. If this information does not answer your questions, please call [place] office at [phone] to clarify.  ° °YOU SHOULD EXPECT: Some feelings of bloating in the abdomen. Passage of more gas than usual. Walking can help get rid of the air that was put into your GI tract during the procedure and reduce the bloating.. ° °DIET: Your first meal following the procedure should be a light meal and then it is ok to progress to your normal diet. A half-sandwich or bowl of soup is an example of a good first meal. Heavy or fried foods are harder to digest and may make you feel nauseous or bloated. Drink plenty of fluids but you should avoid alcoholic beverages for 24 hours. If you had a esophageal dilation, please see attached instructions for diet.   ° °ACTIVITY: Your care partner should take you home directly after the procedure. You should plan to take it easy, moving slowly for the rest of the day. You can resume normal activity the day after the procedure however YOU SHOULD NOT DRIVE, use power tools, machinery or perform tasks that involve climbing or major physical exertion for 24 hours (because of the sedation medicines used during the test).  ° °SYMPTOMS TO REPORT IMMEDIATELY: °A gastroenterologist can be reached at any hour. Please call [phone]  for any of the following symptoms:  ° °Following upper endoscopy (EGD, EUS, ERCP, esophageal dilation) °Vomiting of blood or coffee ground material  °New, significant abdominal pain  °New, significant chest pain or pain under the shoulder blades  °Painful or persistently difficult swallowing  °New shortness of breath  °Black, tarry-looking or red, bloody stools ° °FOLLOW UP:  °If any biopsies were taken you will be contacted by phone or by letter within the next 1-3  weeks. Call [phone]  if you have not heard about the biopsies in 3 weeks.  °Please also call with any specific questions about appointments or follow up tests. ° °

## 2019-03-28 NOTE — Anesthesia Preprocedure Evaluation (Signed)
Anesthesia Evaluation  Patient identified by MRN, date of birth, ID band Patient awake    Reviewed: Allergy & Precautions, NPO status , Patient's Chart, lab work & pertinent test results  Airway Mallampati: II  TM Distance: >3 FB Neck ROM: Full    Dental no notable dental hx.    Pulmonary neg pulmonary ROS,    Pulmonary exam normal breath sounds clear to auscultation       Cardiovascular hypertension, + CAD and + CABG  Normal cardiovascular exam Rhythm:Regular Rate:Normal     Neuro/Psych negative neurological ROS  negative psych ROS   GI/Hepatic negative GI ROS, (+) Cirrhosis   Esophageal Varices    ,   Endo/Other  negative endocrine ROSdiabetes  Renal/GU negative Renal ROS  negative genitourinary   Musculoskeletal negative musculoskeletal ROS (+)   Abdominal   Peds negative pediatric ROS (+)  Hematology negative hematology ROS (+)   Anesthesia Other Findings   Reproductive/Obstetrics negative OB ROS                             Anesthesia Physical Anesthesia Plan  ASA: III  Anesthesia Plan: MAC   Post-op Pain Management:    Induction: Intravenous  PONV Risk Score and Plan: 0  Airway Management Planned: Simple Face Mask  Additional Equipment:   Intra-op Plan:   Post-operative Plan:   Informed Consent: I have reviewed the patients History and Physical, chart, labs and discussed the procedure including the risks, benefits and alternatives for the proposed anesthesia with the patient or authorized representative who has indicated his/her understanding and acceptance.     Dental advisory given  Plan Discussed with: CRNA and Surgeon  Anesthesia Plan Comments:         Anesthesia Quick Evaluation

## 2019-03-28 NOTE — Anesthesia Postprocedure Evaluation (Signed)
Anesthesia Post Note  Patient: [name]  Procedure(s) Performed: ESOPHAGOGASTRODUODENOSCOPY (EGD) WITH PROPOFOL (N/A ) ESOPHAGEAL BANDING     Patient location during evaluation: PACU Anesthesia Type: MAC Level of consciousness: awake and alert Pain management: pain level controlled Vital Signs Assessment: post-procedure vital signs reviewed and stable Respiratory status: spontaneous breathing, nonlabored ventilation, respiratory function stable and patient connected to nasal cannula oxygen Cardiovascular status: stable and blood pressure returned to baseline Postop Assessment: no apparent nausea or vomiting Anesthetic complications: no    Last Vitals:  Vitals:   03/28/19 1320 03/28/19 1330  BP: (!) 121/50 (!) 108/52  Pulse: (!) 57 (!) 56  Resp: 12 18  Temp:    SpO2: 97% 97%    Last Pain:  Vitals:   03/28/19 1330  TempSrc:   PainSc: 8                  Temika Sutphin S

## 2019-03-28 NOTE — Progress Notes (Signed)
Patient came to recovery post variceal banding x 14.  Upon awakening c/o pain 10/10 in chest, states "throat feels on fire".  Notified Dr. [name] and order for pain medication received, see flowsheet/MAR.  Continued to communicate pain level with Dr. [name] received additional order for pain medication see flowsheet/MAR.  Patient then had c/o of nausea with burping, received verbal order for Zofran IV given see flowsheet/MAR.  Patient's pain trending down slowly.  Patient c/o "gas" and burping continued order received for Mylicon drops by mouth, given see MAR/flowsheets.  Amy Esterwood, PA came to see patient at 1430.  Evaluated for admission vs discharge.  Order received to discharge home.  Narcotic prescription given for home to patient/husband.  Reviewed discharge instruction again prior to discharging home.

## 2019-03-28 NOTE — Transfer of Care (Signed)
Immediate Anesthesia Transfer of Care Note  Patient: [name]  Procedure(s) Performed: ESOPHAGOGASTRODUODENOSCOPY (EGD) WITH PROPOFOL (N/A ) ESOPHAGEAL BANDING  Patient Location: PACU and Endoscopy Unit  Anesthesia Type:MAC  Level of Consciousness: sedated and drowsy  Airway & Oxygen Therapy: Patient Spontanous Breathing and Patient connected to nasal cannula oxygen  Post-op Assessment: Report given to RN and Post -op Vital signs reviewed and stable  Post vital signs: Reviewed and stable  Last Vitals:  Vitals Value Taken Time  BP 117/48 03/28/19 1243  Temp    Pulse 57 03/28/19 1244  Resp 31 03/28/19 1244  SpO2 98 % 03/28/19 1244  Vitals shown include unvalidated device data.  Last Pain:  Vitals:   03/28/19 1112  TempSrc: Oral  PainSc: 0-No pain         Complications: No apparent anesthesia complications

## 2019-03-28 NOTE — Interval H&P Note (Signed)
History and Physical Interval Note:  03/28/2019 11:52 AM  [name]  has presented today for surgery, with the diagnosis of eval for varcieal banding, acid/yeast.  The various methods of treatment have been discussed with the patient and family. After consideration of risks, benefits and other options for treatment, the patient has consented to  Procedure(s): ESOPHAGOGASTRODUODENOSCOPY (EGD) WITH PROPOFOL (N/A) as a surgical intervention.  The patient's history has been reviewed, patient examined, no change in status, stable for surgery.  I have reviewed the patient's chart and labs.  Questions were answered to the patient's satisfaction.     [name]

## 2019-03-29 ENCOUNTER — Telehealth: Payer: Self-pay

## 2019-03-29 ENCOUNTER — Encounter (HOSPITAL_COMMUNITY): Payer: Self-pay | Admitting: Gastroenterology

## 2019-03-29 MED FILL — HYDROCOD-APAP 7.5-325/15ML: 7.5-325 | 3 days supply | Qty: 240 | Fill #0

## 2019-03-29 NOTE — Telephone Encounter (Signed)
Yes, I would like to call in hydrocodone liquid for her. MY impravata app is not functioning and has not been functioning for months.  We have asked for IT help without a response.   She needs a script for hycodan, 137ms, she needs to take 5-120m every 6 hours as needed for pain. It is generally a cough syrup but will function very nicely for her pain as well.  Disp 12054mno refills. Thanks

## 2019-03-29 NOTE — Telephone Encounter (Signed)
Pt called and states that she was given a prescription for Hydrocodone (hardcopy) yesterday and she took it to [facility] and they would not fill it for her. Could not find record in epic of prescription. States they have the hardcopy back from the pharmacy. Pharmacy refused to fill it as Narcotic scripts must be sent electronically from the physician. Please advise.

## 2019-03-29 NOTE — Telephone Encounter (Signed)
Spoke with pharmacist Aaron Edelman at [facility] and there are supposed to be exceptions to filling script if there are technical problems preventing electronic prescribing. Per Gaspar Bidding if pt brings script there they can fill it for her. Spoke with pt and she is aware and will take it there.

## 2019-04-01 ENCOUNTER — Telehealth: Payer: Self-pay

## 2019-04-01 ENCOUNTER — Telehealth: Payer: Self-pay | Admitting: Gastroenterology

## 2019-04-01 ENCOUNTER — Emergency Department (HOSPITAL_COMMUNITY): Payer: Medicare Other

## 2019-04-01 ENCOUNTER — Inpatient Hospital Stay (HOSPITAL_COMMUNITY)
Admission: EM | Admit: 2019-04-01 | Discharge: 2019-04-03 | DRG: 380 | Disposition: A | Discharge status: Home / Self Care | Payer: Medicare Other | Attending: Internal Medicine | Admitting: Internal Medicine

## 2019-04-01 ENCOUNTER — Other Ambulatory Visit: Payer: Self-pay

## 2019-04-01 ENCOUNTER — Encounter (HOSPITAL_COMMUNITY): Payer: Self-pay

## 2019-04-01 DIAGNOSIS — R1310 Dysphagia, unspecified: Secondary | ICD-10-CM

## 2019-04-01 DIAGNOSIS — K7460 Unspecified cirrhosis of liver: Secondary | ICD-10-CM | POA: Diagnosis present

## 2019-04-01 DIAGNOSIS — Z90710 Acquired absence of both cervix and uterus: Secondary | ICD-10-CM

## 2019-04-01 DIAGNOSIS — E119 Type 2 diabetes mellitus without complications: Secondary | ICD-10-CM | POA: Diagnosis not present

## 2019-04-01 DIAGNOSIS — I130 Hypertensive heart and chronic kidney disease with heart failure and stage 1 through stage 4 chronic kidney disease, or unspecified chronic kidney disease: Secondary | ICD-10-CM | POA: Diagnosis present

## 2019-04-01 DIAGNOSIS — K7581 Nonalcoholic steatohepatitis (NASH): Secondary | ICD-10-CM | POA: Diagnosis present

## 2019-04-01 DIAGNOSIS — E785 Hyperlipidemia, unspecified: Secondary | ICD-10-CM | POA: Diagnosis present

## 2019-04-01 DIAGNOSIS — E1122 Type 2 diabetes mellitus with diabetic chronic kidney disease: Secondary | ICD-10-CM | POA: Diagnosis present

## 2019-04-01 DIAGNOSIS — I8510 Secondary esophageal varices without bleeding: Secondary | ICD-10-CM | POA: Diagnosis not present

## 2019-04-01 DIAGNOSIS — E440 Moderate protein-calorie malnutrition: Secondary | ICD-10-CM | POA: Diagnosis not present

## 2019-04-01 DIAGNOSIS — R1013 Epigastric pain: Secondary | ICD-10-CM | POA: Diagnosis not present

## 2019-04-01 DIAGNOSIS — I81 Portal vein thrombosis: Secondary | ICD-10-CM | POA: Diagnosis not present

## 2019-04-01 DIAGNOSIS — I959 Hypotension, unspecified: Secondary | ICD-10-CM | POA: Diagnosis present

## 2019-04-01 DIAGNOSIS — Z91040 Latex allergy status: Secondary | ICD-10-CM

## 2019-04-01 DIAGNOSIS — I25118 Atherosclerotic heart disease of native coronary artery with other forms of angina pectoris: Secondary | ICD-10-CM | POA: Diagnosis present

## 2019-04-01 DIAGNOSIS — N179 Acute kidney failure, unspecified: Secondary | ICD-10-CM | POA: Diagnosis not present

## 2019-04-01 DIAGNOSIS — E7849 Other hyperlipidemia: Secondary | ICD-10-CM | POA: Diagnosis not present

## 2019-04-01 DIAGNOSIS — Z981 Arthrodesis status: Secondary | ICD-10-CM

## 2019-04-01 DIAGNOSIS — Z951 Presence of aortocoronary bypass graft: Secondary | ICD-10-CM

## 2019-04-01 DIAGNOSIS — Z888 Allergy status to other drugs, medicaments and biological substances status: Secondary | ICD-10-CM

## 2019-04-01 DIAGNOSIS — I2510 Atherosclerotic heart disease of native coronary artery without angina pectoris: Secondary | ICD-10-CM | POA: Diagnosis present

## 2019-04-01 DIAGNOSIS — K2210 Ulcer of esophagus without bleeding: Secondary | ICD-10-CM | POA: Diagnosis not present

## 2019-04-01 DIAGNOSIS — I10 Essential (primary) hypertension: Secondary | ICD-10-CM

## 2019-04-01 DIAGNOSIS — Z794 Long term (current) use of insulin: Secondary | ICD-10-CM | POA: Diagnosis not present

## 2019-04-01 DIAGNOSIS — N181 Chronic kidney disease, stage 1: Secondary | ICD-10-CM | POA: Diagnosis present

## 2019-04-01 DIAGNOSIS — Z86010 Personal history of colonic polyps: Secondary | ICD-10-CM

## 2019-04-01 DIAGNOSIS — K3189 Other diseases of stomach and duodenum: Secondary | ICD-10-CM | POA: Diagnosis present

## 2019-04-01 DIAGNOSIS — E1165 Type 2 diabetes mellitus with hyperglycemia: Secondary | ICD-10-CM

## 2019-04-01 DIAGNOSIS — Z800 Family history of malignant neoplasm of digestive organs: Secondary | ICD-10-CM

## 2019-04-01 DIAGNOSIS — Z7982 Long term (current) use of aspirin: Secondary | ICD-10-CM

## 2019-04-01 DIAGNOSIS — E860 Dehydration: Secondary | ICD-10-CM | POA: Diagnosis present

## 2019-04-01 DIAGNOSIS — K219 Gastro-esophageal reflux disease without esophagitis: Secondary | ICD-10-CM | POA: Diagnosis not present

## 2019-04-01 DIAGNOSIS — Z887 Allergy status to serum and vaccine status: Secondary | ICD-10-CM

## 2019-04-01 DIAGNOSIS — Z803 Family history of malignant neoplasm of breast: Secondary | ICD-10-CM

## 2019-04-01 DIAGNOSIS — Z8249 Family history of ischemic heart disease and other diseases of the circulatory system: Secondary | ICD-10-CM

## 2019-04-01 DIAGNOSIS — Z8261 Family history of arthritis: Secondary | ICD-10-CM

## 2019-04-01 DIAGNOSIS — K766 Portal hypertension: Secondary | ICD-10-CM | POA: Diagnosis present

## 2019-04-01 DIAGNOSIS — R188 Other ascites: Secondary | ICD-10-CM | POA: Diagnosis present

## 2019-04-01 DIAGNOSIS — Z801 Family history of malignant neoplasm of trachea, bronchus and lung: Secondary | ICD-10-CM

## 2019-04-01 DIAGNOSIS — Z79899 Other long term (current) drug therapy: Secondary | ICD-10-CM

## 2019-04-01 DIAGNOSIS — Z20828 Contact with and (suspected) exposure to other viral communicable diseases: Secondary | ICD-10-CM | POA: Diagnosis not present

## 2019-04-01 DIAGNOSIS — I8500 Esophageal varices without bleeding: Secondary | ICD-10-CM | POA: Diagnosis not present

## 2019-04-01 DIAGNOSIS — Z885 Allergy status to narcotic agent status: Secondary | ICD-10-CM

## 2019-04-01 DIAGNOSIS — Z6825 Body mass index (BMI) 25.0-25.9, adult: Secondary | ICD-10-CM

## 2019-04-01 DIAGNOSIS — M1909 Primary osteoarthritis, other specified site: Secondary | ICD-10-CM | POA: Diagnosis present

## 2019-04-01 DIAGNOSIS — E1136 Type 2 diabetes mellitus with diabetic cataract: Secondary | ICD-10-CM | POA: Diagnosis present

## 2019-04-01 DIAGNOSIS — K5900 Constipation, unspecified: Secondary | ICD-10-CM | POA: Diagnosis present

## 2019-04-01 LAB — CBC
HCT: 47.4 % — ABNORMAL HIGH (ref 36.0–46.0)
Hemoglobin: 15.2 g/dL — ABNORMAL HIGH (ref 12.0–15.0)
MCH: 30.6 pg (ref 26.0–34.0)
MCHC: 32.1 g/dL (ref 30.0–36.0)
MCV: 95.4 fL (ref 80.0–100.0)
Platelets: 149 10*3/uL — ABNORMAL LOW (ref 150–400)
RBC: 4.97 MIL/uL (ref 3.87–5.11)
RDW: 13.3 % (ref 11.5–15.5)
WBC: 8.3 10*3/uL (ref 4.0–10.5)
nRBC: 0 % (ref 0.0–0.2)

## 2019-04-01 LAB — COMPREHENSIVE METABOLIC PANEL
ALT: 44 U/L (ref 0–44)
AST: 55 U/L — ABNORMAL HIGH (ref 15–41)
Albumin: 3.3 g/dL — ABNORMAL LOW (ref 3.5–5.0)
Alkaline Phosphatase: 109 U/L (ref 38–126)
Anion gap: 11 (ref 5–15)
BUN: 30 mg/dL — ABNORMAL HIGH (ref 8–23)
CO2: 26 mmol/L (ref 22–32)
Calcium: 10.1 mg/dL (ref 8.9–10.3)
Chloride: 98 mmol/L (ref 98–111)
Creatinine, Ser: 1.2 mg/dL — ABNORMAL HIGH (ref 0.44–1.00)
GFR calc Af Amer: 52 mL/min — ABNORMAL LOW (ref >60)
GFR calc non Af Amer: 45 mL/min — ABNORMAL LOW (ref >60)
Glucose, Bld: 160 mg/dL — ABNORMAL HIGH (ref 70–99)
Potassium: 4.8 mmol/L (ref 3.5–5.1)
Sodium: 135 mmol/L (ref 135–145)
Total Bilirubin: 1 mg/dL (ref 0.3–1.2)
Total Protein: 7.9 g/dL (ref 6.5–8.1)

## 2019-04-01 LAB — SARS CORONAVIRUS 2 (TAT 6-24 HRS): SARS Coronavirus 2: NEGATIVE

## 2019-04-01 LAB — HEMOGLOBIN A1C
Hgb A1c MFr Bld: 8.4 % — ABNORMAL HIGH (ref 4.8–5.6)
Mean Plasma Glucose: 194.38 mg/dL

## 2019-04-01 LAB — GLUCOSE, CAPILLARY: Glucose-Capillary: 188 mg/dL — ABNORMAL HIGH (ref 70–99)

## 2019-04-01 LAB — LACTIC ACID, PLASMA: Lactic Acid, Venous: 1.9 mmol/L (ref 0.5–1.9)

## 2019-04-01 LAB — LIPASE, BLOOD: Lipase: 20 U/L (ref 11–51)

## 2019-04-01 LAB — CBG MONITORING, ED: Glucose-Capillary: 144 mg/dL — ABNORMAL HIGH (ref 70–99)

## 2019-04-01 MED ORDER — BISACODYL 10 MG RE SUPP: 10.0000 mg | Freq: Every day | RECTAL | Status: DC | PRN

## 2019-04-01 MED ORDER — BISACODYL 10 MG RE SUPP
10.0000 mg | Freq: Every day | RECTAL | Status: DC
  Administered 2019-04-01: 10 mg via RECTAL
  Filled 2019-04-01 (×3): qty 1

## 2019-04-01 MED ORDER — SODIUM CHLORIDE 0.9 % IV BOLUS
1000.0000 mL | Freq: Once | INTRAVENOUS | Status: AC
  Administered 2019-04-01: 1000 mL via INTRAVENOUS

## 2019-04-01 MED ORDER — SODIUM CHLORIDE 0.9 % IV SOLN
INTRAVENOUS | Status: AC
  Administered 2019-04-01: 21:00:00 via INTRAVENOUS

## 2019-04-01 MED ORDER — SIMETHICONE 80 MG PO CHEW
160.0000 mg | CHEWABLE_TABLET | Freq: Once | ORAL | Status: AC
  Administered 2019-04-01: 160 mg via ORAL
  Filled 2019-04-01: qty 2

## 2019-04-01 MED ORDER — ONDANSETRON HCL 4 MG PO TABS: 4.0000 mg | ORAL_TABLET | Freq: Four times a day (QID) | ORAL | Status: DC | PRN

## 2019-04-01 MED ORDER — INSULIN ASPART 100 UNIT/ML ~~LOC~~ SOLN
0.0000 [IU] | Freq: Three times a day (TID) | SUBCUTANEOUS | Status: DC
  Administered 2019-04-02 (×2): 3 [IU] via SUBCUTANEOUS
  Filled 2019-04-01: qty 0.09

## 2019-04-01 MED ORDER — ONDANSETRON HCL 4 MG/2ML IJ SOLN: 4.0000 mg | Freq: Four times a day (QID) | INTRAMUSCULAR | Status: DC | PRN

## 2019-04-01 MED ORDER — INSULIN DETEMIR 100 UNIT/ML ~~LOC~~ SOLN
15.0000 [IU] | Freq: Every day | SUBCUTANEOUS | Status: DC
  Administered 2019-04-01: 23:00:00 15 [IU] via SUBCUTANEOUS
  Filled 2019-04-01 (×2): qty 0.15

## 2019-04-01 MED ORDER — DIPHENHYDRAMINE HCL 12.5 MG/5ML PO ELIX
12.5000 mg | ORAL_SOLUTION | Freq: Three times a day (TID) | ORAL | Status: DC | PRN
  Administered 2019-04-02: 12.5 mg via ORAL
  Filled 2019-04-01: qty 5

## 2019-04-01 MED ORDER — NADOLOL 40 MG PO TABS
40.0000 mg | ORAL_TABLET | Freq: Every day | ORAL | Status: DC
  Administered 2019-04-03: 40 mg via ORAL
  Filled 2019-04-01 (×2): qty 1

## 2019-04-01 MED ORDER — PANTOPRAZOLE SODIUM 40 MG IV SOLR
40.0000 mg | Freq: Every day | INTRAVENOUS | Status: DC
  Administered 2019-04-01 – 2019-04-03 (×3): 40 mg via INTRAVENOUS
  Filled 2019-04-01 (×3): qty 40

## 2019-04-01 MED ORDER — INSULIN ASPART 100 UNIT/ML ~~LOC~~ SOLN
0.0000 [IU] | Freq: Every day | SUBCUTANEOUS | Status: DC
  Filled 2019-04-01: qty 0.05

## 2019-04-01 MED ORDER — SUCRALFATE 1 GM/10ML PO SUSP
1.0000 g | Freq: Three times a day (TID) | ORAL | Status: DC
  Administered 2019-04-01 – 2019-04-03 (×8): 1 g via ORAL
  Filled 2019-04-01 (×8): qty 10

## 2019-04-01 MED ORDER — LIDOCAINE VISCOUS HCL 2 % MT SOLN
15.0000 mL | Freq: Three times a day (TID) | OROMUCOSAL | Status: DC
  Administered 2019-04-01 – 2019-04-03 (×6): 15 mL via OROMUCOSAL
  Filled 2019-04-01 (×5): qty 15

## 2019-04-01 MED ORDER — HYDROCODONE-ACETAMINOPHEN 7.5-325 MG/15ML PO SOLN
15.0000 mL | Freq: Four times a day (QID) | ORAL | Status: DC | PRN
  Administered 2019-04-02 – 2019-04-03 (×4): 15 mL via ORAL
  Filled 2019-04-01 (×4): qty 15

## 2019-04-01 NOTE — H&P (Signed)
History and Physical    [name]:[id number] DOB: Jan 05, 1946 DOA: 04/01/2019  PCP: Gayland Curry, DO  Patient coming from: Home  I have personally briefly reviewed patient's old medical records in [place]  Chief Complaint: Difficulty swallowing with pain  HPI: [name] is a 73 y.o. female with medical history significant of NAFLD with cirrhosis, esophageal varices, type 2 diabetes mellitus, essential hypertension, hyperlipidemia who presents from home with complaint of difficulty swallowing with associated pain.  Over the past 4 days, patient reports difficulty even swallowing of water; although states has no issues swallowing her home medications except for her potassium supplement.  She reports recently underwent EGD with esophageal banding by gastroenterology, Dr. [name] on 03/28/2019.  Since the procedure, she reports progressive pain with swallowing and associated spasms.  She reports these episodes are intermittent following oral intake lasting 1-2 minutes before resolving.  She reports very poor oral intake over the past few days.  Additionally, patient reports 2 episodes of emesis on Saturday that is reported nonbloody/nonbilious.  Also reports no bowel movement since last Thursday.  No other complaints or concerns at this time.  Denies headache, no dizziness, no fever/chills/night sweats, no diarrhea, no chest pain, no shortness of breath, no palpitations, no weakness, no fatigue, no paresthesias.  ED Course: In the ED, BP 77/49, RR 17, HR 54, SPO2 97% on room air.  Sodium 135, potassium 4.8, chloride 98, CO2 26, BUN 30, creatinine 1.20, glucose 160.  Lipase 20.  ALT 44, AST 55, total bilirubin 1.0.  Chest x-ray with no acute airspace consolidation.  EDP consulted GI with recommendations of [facility] admission.  Review of Systems: As per HPI otherwise 10 point review of systems negative.    Past Medical History:  Diagnosis Date   Allergy    Arthritis    neck   Cataract    bilateral - MD monitoring cataracts   CHF (congestive heart failure) (HCC)    Chronic kidney disease, stage I    DR OTTELIN  HX UTIS   Cirrhosis (HCC)    Cramp of limb    Diabetes mellitus    Dysphagia, unspecified(787.20)    Dysuria    Epistaxis    GERD (gastroesophageal reflux disease)    Heart murmur    NO CARDIOLOGIST  DX FOR YEARS ASYMPTOMATIC   Lumbago    Neoplasm of uncertain behavior of skin    Nonspecific elevation of levels of transaminase or lactic acid dehydrogenase (LDH)    Osteoarthrosis, unspecified whether generalized or localized, unspecified site    Other and unspecified hyperlipidemia    diet controlled   Pain in joint, shoulder region    Paresthesias 04/01/2015   Postablative ovarian failure    Trochanteric bursitis of left hip 12/15/2015   Type 2 diabetes mellitus without complication ([place])    Unspecified essential hypertension    no meds    Past Surgical History:  Procedure Laterality Date   BREAST BIOPSY     CARDIAC CATHETERIZATION N/A 01/27/2016   Procedure: Left Heart Cath and Coronary Angiography;  Surgeon: Belva Crome, MD;  Location: [place] [facility];  Service: Cardiovascular;  Laterality: N/A;   COLONOSCOPY  2012   [facility] - polyp   COLONOSCOPY WITH PROPOFOL N/A 07/07/2016   Procedure: COLONOSCOPY WITH PROPOFOL;  Surgeon: [name], MD;  Location: [facility];  Service: Endoscopy;  Laterality: N/A;   CORONARY ARTERY BYPASS GRAFT N/A 01/28/2016   Procedure: CORONARY ARTERY BYPASS GRAFTING (  CABG) x 3 USING RIGHT LEG GREATER SAPHENOUS VEIN GRAFT;  Surgeon: Melrose Nakayama, MD;  Location: [place];  Service: Open Heart Surgery;  Laterality: N/A;   ENDOVEIN HARVEST OF GREATER SAPHENOUS VEIN Right 01/28/2016   Procedure: ENDOVEIN HARVEST OF GREATER SAPHENOUS VEIN;  Surgeon: Melrose Nakayama, MD;  Location: [place];  Service: Open Heart Surgery;  Laterality: Right;   ESOPHAGEAL BANDING  03/28/2019     Procedure: ESOPHAGEAL BANDING;  Surgeon: [name], MD;  Location: [facility];  Service: Endoscopy;;   ESOPHAGOGASTRODUODENOSCOPY (EGD) WITH PROPOFOL N/A 07/07/2016   Procedure: ESOPHAGOGASTRODUODENOSCOPY (EGD) WITH PROPOFOL;  Surgeon: [name], MD;  Location: [facility];  Service: Endoscopy;  Laterality: N/A;   ESOPHAGOGASTRODUODENOSCOPY (EGD) WITH PROPOFOL N/A 03/28/2019   Procedure: ESOPHAGOGASTRODUODENOSCOPY (EGD) WITH PROPOFOL;  Surgeon: [name], MD;  Location: [facility];  Service: Endoscopy;  Laterality: N/A;   MAXIMUM ACCESS (MAS)POSTERIOR LUMBAR INTERBODY FUSION (PLIF) 1 LEVEL Left 06/10/2015   Procedure: FOR MAXIMUM ACCESS (MAS) POSTERIOR LUMBAR INTERBODY FUSION (PLIF) LUMBAR THREE-FOUR EXTRAFORAMINAL MICRODISCECTOMY LUMBAR FIVE-SACRAL ONE LEFT;  Surgeon: Eustace Moore, MD;  Location: [place] [facility];  Service: Neurosurgery;  Laterality: Left;   TEE WITHOUT CARDIOVERSION N/A 01/28/2016   Procedure: TRANSESOPHAGEAL ECHOCARDIOGRAM (TEE);  Surgeon: Melrose Nakayama, MD;  Location: [place];  Service: Open Heart Surgery;  Laterality: N/A;   TUBAL LIGATION  1982   Dr Connye Burkitt   UPPER GASTROINTESTINAL ENDOSCOPY     VAGINAL HYSTERECTOMY  1997   Dr Rande Lawman     reports that she has never smoked. She has never used smokeless tobacco. She reports that she does not drink alcohol or use drugs.  Allergies  Allergen Reactions   Kiwi Extract Anaphylaxis   Tdap [Tetanus-Diphth-Acell Pertussis] Swelling and Other (See Comments)    Swelling at injection site, gets very hot   Statins     RHABDOMYOLYSIS   Latex Itching, Dermatitis and Rash   Tramadol Nausea And Vomiting    Family History  Problem Relation Age of Onset   Lung cancer Father    Arthritis Sister    Arthritis Brother    Heart disease Maternal Grandmother    Heart disease Maternal Grandfather    Heart disease Paternal Grandmother    Heart disease Paternal Grandfather    Breast cancer  Mother    Liver cancer Brother    Breast cancer Maternal Aunt    Breast cancer Paternal Aunt    Colon cancer Neg Hx    Esophageal cancer Neg Hx    Rectal cancer Neg Hx    Stomach cancer Neg Hx     Family history reviewed and not pertinent   Prior to Admission medications   Medication Sig Start Date End Date Taking? Authorizing Provider  acetaminophen (TYLENOL) 500 MG tablet Take 500 mg by mouth at bedtime.    Yes [provider]  Aromatic Inhalants (VICKS VAPOR IN) Vicks Vapor Rub apply small amount to outside of nose to help breathing   Yes [provider]  aspirin EC 81 MG tablet Take 1 tablet (81 mg total) by mouth daily. 01/16/17  Yes Josue Hector, MD  Biotin 10000 MCG TABS Take 10,000 mcg by mouth every morning.   Yes [provider]  calcium carbonate (OS-CAL) 600 MG TABS Take 600 mg by mouth 2 (two) times daily with a meal.     Yes [provider]  Cholecalciferol (VITAMIN D) 50 MCG (2000 UT) CAPS Take 2,000 Units by  mouth daily.    Yes [provider]  Cyanocobalamin (VITAMIN B 12 PO) Take 1,000 mcg by mouth daily.     Yes [provider]  empagliflozin (JARDIANCE) 25 MG TABS tablet Take 25 mg by mouth daily. 01/31/19  Yes Reed, Tiffany L, DO  ezetimibe (ZETIA) 10 MG tablet TAKE 1 TABLET(10 MG) BY MOUTH DAILY Patient taking differently: Take 10 mg by mouth daily.  12/26/18  Yes Reed, Tiffany L, DO  furosemide (LASIX) 40 MG tablet TAKE 1 TABLET(40 MG) BY MOUTH DAILY Patient taking differently: Take 40 mg by mouth daily.  07/23/18  Yes Josue Hector, MD  HYDROcodone-acetaminophen (HYCET) 7.5-325 mg/15 ml solution Take 15 mLs by mouth every 4 (four) hours as needed for pain. 03/29/19  Yes [provider]  insulin detemir (LEVEMIR) 100 UNIT/ML injection Inject 0.48 mLs (48 Units total) into the skin at bedtime. 02/18/19  Yes Reed, Tiffany L, DO  loratadine (CLARITIN) 10 MG tablet Take 10 mg by mouth daily as needed  for allergies.   Yes [provider]  MAGNESIUM PO Take 500 mg by mouth 2 (two) times daily in the am and at bedtime..   Yes [provider]  Multiple Vitamins-Minerals (MULTIVITAMIN WITH MINERALS) tablet Take 1 tablet by mouth daily.     Yes [provider]  nadolol (CORGARD) 40 MG tablet Take 1 tablet (40 mg total) by mouth daily. 02/28/19  Yes Reed, Tiffany L, DO  NOVOLOG FLEXPEN 100 UNIT/ML FlexPen Inject 12 units under the skin every morning, 8 units at lunch and 12 units at supper Patient taking differently: Inject 8-12 Units into the skin See admin instructions. Inject 12 units under the skin every morning, 8 units at lunch and 12 units at supper 02/19/19  Yes Reed, Tiffany L, DO  omeprazole (PRILOSEC) 20 MG capsule TAKE 1 CAPSULE BY MOUTH DAILY Patient taking differently: Take 20 mg by mouth daily.  12/11/18  Yes Reed, Tiffany L, DO  Polyethyl Glycol-Propyl Glycol (SYSTANE OP) Place 1 drop into both eyes 2 (two) times daily.   Yes [provider]  potassium chloride SA (KLOR-CON) 20 MEQ tablet Take 1 tablet (20 mEq total) by mouth daily. 03/11/19  Yes Reed, Tiffany L, DO  Probiotic Product (PROBIOTIC DAILY PO) Take 1 capsule by mouth daily. Digestive Advantage Probiotic   Yes [provider]  spironolactone (ALDACTONE) 100 MG tablet TAKE 1 TABLET(100 MG) BY MOUTH TWICE DAILY Patient taking differently: Take 100 mg by mouth 2 (two) times daily.  09/10/18  Yes [name], MD  BD PEN NEEDLE NANO U/F 32G X 4 MM MISC USE THREE TIMES DAILY AS DIRECTED 11/26/18   Reed, Tiffany L, DO  glucose blood test strip One Touch Ultra II strips. Use to test blood sugar three times daily. Dx: E11.65 06/01/17   Reed, Tiffany L, DO  Insulin Syringe-Needle U-100 (INSULIN SYRINGE 1CC/31GX5/16") 31G X 5/16" 1 ML MISC USE AS DIRECTED DAILY WITH LEVEMIR 07/27/18   Gayland Curry, DO    Physical Exam: Vitals:   04/01/19 1416 04/01/19 1500 04/01/19 1530 04/01/19 1600  BP:  117/68 (!) 97/54 (!) 95/50 (!) 94/52  Pulse: (!) 54 (!) 51 (!) 54 (!) 54  Resp: 17 15 (!) 23 (!) 25  Temp:      TempSrc:      SpO2: 97% 98% 98% 97%  Weight:      Height:        Constitutional: NAD, calm, comfortable Vitals:  04/01/19 1416 04/01/19 1500 04/01/19 1530 04/01/19 1600  BP: 117/68 (!) 97/54 (!) 95/50 (!) 94/52  Pulse: (!) 54 (!) 51 (!) 54 (!) 54  Resp: 17 15 (!) 23 (!) 25  Temp:      TempSrc:      SpO2: 97% 98% 98% 97%  Weight:      Height:       Eyes: PERRL, lids and conjunctivae normal ENMT: Mucous membranes are moist. Posterior pharynx clear of any exudate or lesions.Normal dentition.  Neck: normal, supple, no masses, no thyromegaly Respiratory: clear to auscultation bilaterally, no wheezing, no crackles. Normal respiratory effort. No accessory muscle use.  Cardiovascular: Regular rate and rhythm, no murmurs / rubs / gallops. No extremity edema. 2+ pedal pulses. No carotid bruits.  Abdomen: no tenderness, no masses palpated. No hepatosplenomegaly. Bowel sounds positive.  Musculoskeletal: no clubbing / cyanosis. No joint deformity upper and lower extremities. Good ROM, no contractures. Normal muscle tone.  Skin: no rashes, lesions, ulcers. No induration Neurologic: CN 2-12 grossly intact. Sensation intact, DTR normal. Strength 5/5 in all 4.  Psychiatric: Normal judgment and insight. Alert and oriented x 3. Normal mood.    Labs on Admission: I have personally reviewed following labs and imaging studies  CBC: Recent Labs  Lab 04/01/19 1319  WBC 8.3  HGB 15.2*  HCT 47.4*  MCV 95.4  PLT 833*   Basic Metabolic Panel: Recent Labs  Lab 04/01/19 1319  NA 135  K 4.8  CL 98  CO2 26  GLUCOSE 160*  BUN 30*  CREATININE 1.20*  CALCIUM 10.1   GFR: Estimated Creatinine Clearance: 38.7 mL/min (A) (by C-G formula based on SCr of 1.2 mg/dL (H)). Liver Function Tests: Recent Labs  Lab 04/01/19 1319  AST 55*  ALT 44  ALKPHOS 109  BILITOT 1.0  PROT 7.9    ALBUMIN 3.3*   Recent Labs  Lab 04/01/19 1319  LIPASE 20   No results for input(s): AMMONIA in the last 168 hours. Coagulation Profile: No results for input(s): INR, PROTIME in the last 168 hours. Cardiac Enzymes: No results for input(s): CKTOTAL, CKMB, CKMBINDEX, TROPONINI in the last 168 hours. BNP (last 3 results) No results for input(s): PROBNP in the last 8760 hours. HbA1C: No results for input(s): HGBA1C in the last 72 hours. CBG: Recent Labs  Lab 03/28/19 1146 04/01/19 1315  GLUCAP 228* 144*   Lipid Profile: No results for input(s): CHOL, HDL, LDLCALC, TRIG, CHOLHDL, LDLDIRECT in the last 72 hours. Thyroid Function Tests: No results for input(s): TSH, T4TOTAL, FREET4, T3FREE, THYROIDAB in the last 72 hours. Anemia Panel: No results for input(s): VITAMINB12, FOLATE, FERRITIN, TIBC, IRON, RETICCTPCT in the last 72 hours. Urine analysis:    Component Value Date/Time   COLORURINE YELLOW 06/19/2017 [place] 06/19/2017 1228   LABSPEC 1.007 06/19/2017 1228   PHURINE 7.0 06/19/2017 1228   GLUCOSEU NEGATIVE 06/19/2017 1228   HGBUR TRACE (A) 06/19/2017 1228   BILIRUBINUR NEGATIVE 01/27/2016 2016   BILIRUBINUR neg 05/05/2012 1445   KETONESUR NEGATIVE 06/19/2017 1228   PROTEINUR NEGATIVE 06/19/2017 1228   UROBILINOGEN 0.2 05/05/2012 1445   UROBILINOGEN 0.2 12/18/2011 1409   NITRITE NEGATIVE 06/19/2017 1228   LEUKOCYTESUR 3+ (A) 06/19/2017 1228    Radiological Exams on Admission: Xr Chest Single View  Result Date: 04/01/2019 CLINICAL DATA:  Epigastric pain. Additional history provided: Patient reports being unable to swallow even small amounts. EXAM: PORTABLE CHEST 1 VIEW COMPARISON:  Fluoroscopic esophagram 03/18/2019, prior chest  radiograph 03/29/2016 FINDINGS: Sequela prior median sternotomy/CABG. Heart size within normal limits. Aortic atherosclerosis No airspace consolidation No evidence of pleural effusion or pneumothorax. No acute bony  abnormality. Overlying cardiac monitoring leads. IMPRESSION: No airspace consolidation Electronically Signed   By: Kellie Simmering DO   On: 04/01/2019 14:07    EKG: Independently reviewed.  Sinus bradycardia, rate 52, QTc 461, T wave inversion lead III which is similar in appearance on review of EKG dating 01/16/2017.  No other concerning ST elevation/depression or T wave inversions.  Assessment/Plan Principal Problem:   Dysphagia Active Problems:   Hyperlipemia   GERD (gastroesophageal reflux disease)   Diabetes mellitus type 2, controlled (HCC)   Essential hypertension   CAD (coronary artery disease)   Cirrhosis of liver without ascites (HCC)   Esophageal varices without bleeding (HCC)   Odynophagia   Hypotension Patient presenting from home with difficulty swallowing and noted to have BP 77/49 on presentation.  Etiology likely poor oral intake and significant dehydration.  Patient received 1 L NS in ED with improvement of her blood pressure to 117/68.  Patient is afebrile without leukocytosis, unlikely infectious etiology. --continue NS at 75 mL's per hour --Holding home spironolactone and furosemide --monitor BP closely  Dysphagia, odynophagia Patient with continued difficulty swallowing, food and liquids; although able to tolerate oral medications except for potassium supplement.  Recent EGD on 03/28/2019 by Dr. [name] with numerous esophageal varices status post placement of 14 variceal bands.  Etiology likely secondary to recent intervention with EGD/variceal banding versus GI dysmotility. --[facility] consulted --Clear liquid diet --GI started on viscous lidocaine and Carafate --Further per gastroenterology  Constipation --Dulcolax suppository --Closely monitor bowel movements  NAFLD w/ cirrhosis Esophageal varices Follows with [facility], Dr. [name].  Recent EGD on 03/28/2019 with multiple esophageal varices status post banding x14.  Current home regimen includes Lasix 40  mg p.o. daily, spironolactone 100 mg p.o. twice daily and nadolol 80 mg p.o. daily. --Check INR to calculate meld score --Holding home spironolactone and Lasix secondary to hypotension on admission --Continue nadolol 66m PO daily --Diagnosis Daily weights  Type 2 diabetes mellitus Last hemoglobin A1c 9.7 on 01/24/2019, not optimally controlled.  Home regimen includes Levemir 48 units nightly, Jardiance 25 mg p.o. daily, and NovoLog 12u with breakfast, 8 units with lunch, 12 units with dinner. --Glucose 160 on presentation --On clear liquid diet currently --will decrease Levemir to 15 units QHS given poor oral intake --Insulin scale for further coverage --Repeat hemoglobin A1c  HLD: On Zetia 10 mg p.o. daily at home.  Reports allergies to statins.  Will hold home medication at this time.  Essential hypertension Hypotensive on presentation, 77/49.  Improved with 1 L NS bolus, now up to 117/68. --Hold home Lasix/spironolactone for now --Nadolol --Monitor blood pressure closely   DVT prophylaxis: Patient reports reaction to chemical DVT prophylaxis in the past, ordered SCDs Code Status: Full code Family Communication: Discussed with patient spouse, present at bedside Disposition Plan: Anticipate discharge home in 1-2 days Consults called:  GI called by EDP Admission status: Observation, med/surg   Ernest Popowski J [place]DO [facility] 04/01/2019, 4:47 PM

## 2019-04-01 NOTE — ED Provider Notes (Signed)
[facility] Emergency Department Provider Note MRN:  [id number]  Arrival date & time: 04/01/19     Chief Complaint   Post-op Problem and Hypotension   History of Present Illness   [name] is a 73 y.o. year-old female with a history of diabetes, cirrhosis presenting to the ED with chief complaint of pain with swallowing  Patient explains that she has fatty liver disease which has caused cirrhosis.  She had esophageal varices that were banded 4 days ago.  Since then she has continued to have severe pain with swallowing.  Largely unable to eat or drink.  Can drink water but it is very painful.  Denies fever, no cough, no vomiting, no black stool, no bloody stool.  No stools for the past 4 days.  Pain is located in the lower chest and epigastrium, nonradiating.  No lower abdominal pain.  Review of Systems  A complete 10 system review of systems was obtained and all systems are negative except as noted in the HPI and PMH.   Patient's Health History    Past Medical History:  Diagnosis Date  . Allergy   . Arthritis    neck  . Cataract    bilateral - MD monitoring cataracts  . CHF (congestive heart failure) ([place])   . Chronic kidney disease, stage I    DR OTTELIN  HX UTIS  . Cirrhosis ([place])   . Cramp of limb   . Diabetes mellitus   . Dysphagia, unspecified(787.20)   . Dysuria   . Epistaxis   . GERD (gastroesophageal reflux disease)   . Heart murmur    NO CARDIOLOGIST  DX FOR YEARS ASYMPTOMATIC  . Lumbago   . Neoplasm of uncertain behavior of skin   . Nonspecific elevation of levels of transaminase or lactic acid dehydrogenase (LDH)   . Osteoarthrosis, unspecified whether generalized or localized, unspecified site   . Other and unspecified hyperlipidemia    diet controlled  . Pain in joint, shoulder region   . Paresthesias 04/01/2015  . Postablative ovarian failure   . Trochanteric bursitis of left hip 12/15/2015  . Type 2 diabetes mellitus  without complication ([place])   . Unspecified essential hypertension    no meds    Past Surgical History:  Procedure Laterality Date  . BREAST BIOPSY    . CARDIAC CATHETERIZATION N/A 01/27/2016   Procedure: Left Heart Cath and Coronary Angiography;  Surgeon: Belva Crome, MD;  Location: [facility];  Service: Cardiovascular;  Laterality: N/A;  . COLONOSCOPY  2012   [facility] - polyp  . COLONOSCOPY WITH PROPOFOL N/A 07/07/2016   Procedure: COLONOSCOPY WITH PROPOFOL;  Surgeon: [name], MD;  Location: [facility];  Service: Endoscopy;  Laterality: N/A;  . CORONARY ARTERY BYPASS GRAFT N/A 01/28/2016   Procedure: CORONARY ARTERY BYPASS GRAFTING (CABG) x 3 USING RIGHT LEG GREATER SAPHENOUS VEIN GRAFT;  Surgeon: Melrose Nakayama, MD;  Location: [place];  Service: Open Heart Surgery;  Laterality: N/A;  . ENDOVEIN HARVEST OF GREATER SAPHENOUS VEIN Right 01/28/2016   Procedure: ENDOVEIN HARVEST OF GREATER SAPHENOUS VEIN;  Surgeon: Melrose Nakayama, MD;  Location: [place];  Service: Open Heart Surgery;  Laterality: Right;  . ESOPHAGEAL BANDING  03/28/2019   Procedure: ESOPHAGEAL BANDING;  Surgeon: [name], MD;  Location: [facility];  Service: Endoscopy;;  . ESOPHAGOGASTRODUODENOSCOPY (EGD) WITH PROPOFOL N/A 07/07/2016   Procedure: ESOPHAGOGASTRODUODENOSCOPY (EGD) WITH PROPOFOL;  Surgeon: [name], MD;  Location: [facility];  Service: Endoscopy;  Laterality: N/A;  . ESOPHAGOGASTRODUODENOSCOPY (EGD) WITH PROPOFOL N/A 03/28/2019   Procedure: ESOPHAGOGASTRODUODENOSCOPY (EGD) WITH PROPOFOL;  Surgeon: [name], MD;  Location: [facility];  Service: Endoscopy;  Laterality: N/A;  . MAXIMUM ACCESS (MAS)POSTERIOR LUMBAR INTERBODY FUSION (PLIF) 1 LEVEL Left 06/10/2015   Procedure: FOR MAXIMUM ACCESS (MAS) POSTERIOR LUMBAR INTERBODY FUSION (PLIF) LUMBAR THREE-FOUR EXTRAFORAMINAL MICRODISCECTOMY LUMBAR FIVE-SACRAL ONE LEFT;  Surgeon: Eustace Moore, MD;  Location: [facility];   Service: Neurosurgery;  Laterality: Left;  . TEE WITHOUT CARDIOVERSION N/A 01/28/2016   Procedure: TRANSESOPHAGEAL ECHOCARDIOGRAM (TEE);  Surgeon: Melrose Nakayama, MD;  Location: [place];  Service: Open Heart Surgery;  Laterality: N/A;  . TUBAL LIGATION  1982   Dr Connye Burkitt  . UPPER GASTROINTESTINAL ENDOSCOPY    . VAGINAL HYSTERECTOMY  1997   Dr Rande Lawman    Family History  Problem Relation Age of Onset  . Lung cancer Father   . Arthritis Sister   . Arthritis Brother   . Heart disease Maternal Grandmother   . Heart disease Maternal Grandfather   . Heart disease Paternal Grandmother   . Heart disease Paternal Grandfather   . Breast cancer Mother   . Liver cancer Brother   . Breast cancer Maternal Aunt   . Breast cancer Paternal Aunt   . Colon cancer Neg Hx   . Esophageal cancer Neg Hx   . Rectal cancer Neg Hx   . Stomach cancer Neg Hx     Social History   Socioeconomic History  . Marital status: Married    Spouse name: Not on file  . Number of children: 6  . Years of education: Not on file  . Highest education level: Not on file  Occupational History  . Occupation: retired  Scientific laboratory technician  . Financial resource strain: Not hard at all  . Food insecurity    Worry: Never true    Inability: Never true  . Transportation needs    Medical: No    Non-medical: No  Tobacco Use  . Smoking status: Never Smoker  . Smokeless tobacco: Never Used  Substance and Sexual Activity  . Alcohol use: No  . Drug use: No  . Sexual activity: Yes    Partners: Male    Birth control/protection: Post-menopausal, Surgical    Comment: Hysterectomy  Lifestyle  . Physical activity    Days per week: 0 days    Minutes per session: 0 min  . Stress: Not at all  Relationships  . Social connections    Talks on phone: More than three times a week    Gets together: Twice a week    Attends religious service: More than 4 times per year    Active member of club or organization: No    Attends meetings of  clubs or organizations: Never    Relationship status: Married  . Intimate partner violence    Fear of current or ex partner: No    Emotionally abused: No    Physically abused: No    Forced sexual activity: No  Other Topics Concern  . Not on file  Social History Narrative  . Not on file     Physical Exam  Vital Signs and Nursing Notes reviewed Vitals:   04/01/19 1318 04/01/19 1416  BP: 106/62 117/68  Pulse: (!) 49 (!) 54  Resp: 12 17  Temp:    SpO2:  97%    CONSTITUTIONAL: Well-appearing, NAD NEURO:  Alert and oriented x  3, no focal deficits EYES:  eyes equal and reactive ENT/NECK:  no LAD, no JVD CARDIO: Bradycardic rate, well-perfused, normal S1 and S2 PULM:  CTAB no wheezing or rhonchi GI/GU:  normal bowel sounds, non-distended, mild epigastric tenderness to palpation MSK/SPINE:  No gross deformities, no edema SKIN:  no rash, atraumatic PSYCH:  Appropriate speech and behavior  Diagnostic and Interventional Summary    EKG Interpretation  Date/Time:  Monday April 01 2019 13:11:37 EST Ventricular Rate:  52 PR Interval:    QRS Duration: 104 QT Interval:  495 QTC Calculation: 461 R Axis:   -29 Text Interpretation: Sinus rhythm Borderline left axis deviation No significant change was found Confirmed by Gerlene Fee [id number]) on 04/01/2019 1:19:46 PM      Labs Reviewed  CBC - Abnormal; Notable for the following components:      Result Value   Hemoglobin 15.2 (*)    HCT 47.4 (*)    Platelets 149 (*)    All other components within normal limits  COMPREHENSIVE METABOLIC PANEL - Abnormal; Notable for the following components:   Glucose, Bld 160 (*)    BUN 30 (*)    Creatinine, Ser 1.20 (*)    Albumin 3.3 (*)    AST 55 (*)    GFR calc non Af Amer 45 (*)    GFR calc Af Amer 52 (*)    All other components within normal limits  CBG MONITORING, ED - Abnormal; Notable for the following components:   Glucose-Capillary 144 (*)    All other components within normal  limits  SARS CORONAVIRUS 2 (TAT 6-24 HRS)  LACTIC ACID, PLASMA  LIPASE, BLOOD    XR Chest Single View  Final Result      Medications  sodium chloride 0.9 % bolus 1,000 mL (1,000 mLs Intravenous New Bag/Given 04/01/19 1320)     Procedures  /  Critical Care Procedures  ED Course and Medical Decision Making  I have reviewed the triage vital signs and the nursing notes.  Pertinent labs & imaging results that were available during my care of the patient were reviewed by me and considered in my medical decision making (see below for details).     Continued postop pain and p.o. intolerance, presenting with hypotension but well-appearing, normal mentation, on my evaluation blood pressure is improved to 031 systolic without intervention.  Largely benign abdomen.  Denies any bleeding.  Given her history of varices there is concern for variceal bleeding but there is no evidence of such.  Her blood pressures could be explained by dehydration in the setting of poor p.o. intake.  Will monitor closely, work-up pending.  Discussed with [facility], who agrees with admission given her p.o. intolerance.  Will admit to hospital service.  Barth Kirks. Sedonia Small, MD [place] [email or web address]  Final Clinical Impressions(s) / ED Diagnoses     ICD-10-CM   1. Dehydration  E86.0   2. Epigastric pain  R10.13 XR Chest Single View    XR Chest Single View    ED Discharge Orders    None       Discharge Instructions Discussed with and Provided to Patient:   Discharge Instructions   None       Maudie Flakes, MD 04/01/19 1500

## 2019-04-01 NOTE — Consult Note (Addendum)
Referring Provider:  [facility]         Primary Care Physician:  Gayland Curry, DO Primary Gastroenterologist: Oretha Caprice, MD           Reason for Consultation:     Odynophagia              ASSESSMENT /  PLAN    85. 73 yo female with cirrhosis complicated by portal HTN. In ED now with dehydration secondary to diminished PO intake. She has severe odynophagia post EGD with ligation of esophageal varices (14 bands) on 11/19//20. May have post -banding ulcerations.  -Agree with admission for IV fluid repletion.  -IV PPI -Carafate suspension + Lidocaine TID -sips of clear as tolerated.   2. Constipation- likely secondary to hydrocodone and dehydration.  -Dulcolax suppositories for now.  -when taking PO will give Miralax if still needed.  3. Mild AKI.  -would hold home diuretics today.  -am BMET     Attending Physician Note   I have taken a history, examined the patient and reviewed the chart. I agree with the Advanced Practitioner's note, impression and recommendations.  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 female with PMH as listed below.  She is known to Dr. [name] for history of adenomatous colon polyps, cirrhosis complicated by portal hypertension (probably secondary to fatty liver) with ascites and esophageal varices.  She has chronic portal vein thrombosis.    03/11/2019 seen in office for evaluation of abdominal discomfort and dysphagia.  Intermittent abdominal pain felt to be functional in nature especially since they improved with bowel  movements.  She declined antispasmodic medication.  Barium swallow ordered.  03/18/2019 barium swallow-prominent paraesophageal varices in the distal esophagus, fold thickening of the distal esophagus suggesting esophagitis.  03/28/2019 EGD for evaluation of dysphagia-  -Grade III varices were found in the mid esophagus and in the distal esophagus without signs of recent bleeding. Fourteen bands were successfully placed with complete eradication, resulting in deflation of varices. There was no bleeding during the maneuver. Moderate portal gastropathy changes throughout the stomach. No gastric varices. -Repeat EGD 4 to 6 weeks.  Interim history: Presented to the ED today with severe odynophagia, unable to tolerate solids or liquids without significant chest pain. No pain swallowing saliva. No chest pain at rest. No SOB. Except for constipation she feels okay from GI standpoint. Feels like hydrocodone led to constipation.   Labs in ED suggest mild AKI.  Creatinine 1.2 (baseline 1.04).  Electrolytes okay.  Albumin 3.3.  AST 55, ALT 44.  WBC 8.3, Hgb 15.2.    Past Medical History:  Diagnosis Date  . Allergy   . Arthritis    neck  . Cataract    bilateral - MD monitoring cataracts  . CHF (congestive heart failure) ([place])   . Chronic kidney disease, stage I    DR OTTELIN  HX UTIS  . Cirrhosis ([place])   . Cramp of limb   . Diabetes mellitus   . Dysphagia, unspecified(787.20)   . Dysuria   . Epistaxis   . GERD (gastroesophageal reflux disease)   .  Heart murmur    NO CARDIOLOGIST  DX FOR YEARS ASYMPTOMATIC  . Lumbago   . Neoplasm of uncertain behavior of skin   . Nonspecific elevation of levels of transaminase or lactic acid dehydrogenase (LDH)   . Osteoarthrosis, unspecified whether generalized or localized, unspecified site   . Other and unspecified hyperlipidemia    diet controlled  . Pain in joint, shoulder region   . Paresthesias 04/01/2015  . Postablative ovarian failure   .  Trochanteric bursitis of left hip 12/15/2015  . Type 2 diabetes mellitus without complication ([place])   . Unspecified essential hypertension    no meds    Past Surgical History:  Procedure Laterality Date  . BREAST BIOPSY    . CARDIAC CATHETERIZATION N/A 01/27/2016   Procedure: Left Heart Cath and Coronary Angiography;  Surgeon: Belva Crome, MD;  Location: [facility];  Service: Cardiovascular;  Laterality: N/A;  . COLONOSCOPY  2012   [facility] - polyp  . COLONOSCOPY WITH PROPOFOL N/A 07/07/2016   Procedure: COLONOSCOPY WITH PROPOFOL;  Surgeon: [name], MD;  Location: [facility];  Service: Endoscopy;  Laterality: N/A;  . CORONARY ARTERY BYPASS GRAFT N/A 01/28/2016   Procedure: CORONARY ARTERY BYPASS GRAFTING (CABG) x 3 USING RIGHT LEG GREATER SAPHENOUS VEIN GRAFT;  Surgeon: Melrose Nakayama, MD;  Location: [place];  Service: Open Heart Surgery;  Laterality: N/A;  . ENDOVEIN HARVEST OF GREATER SAPHENOUS VEIN Right 01/28/2016   Procedure: ENDOVEIN HARVEST OF GREATER SAPHENOUS VEIN;  Surgeon: Melrose Nakayama, MD;  Location: [place];  Service: Open Heart Surgery;  Laterality: Right;  . ESOPHAGEAL BANDING  03/28/2019   Procedure: ESOPHAGEAL BANDING;  Surgeon: [name], MD;  Location: [facility];  Service: Endoscopy;;  . ESOPHAGOGASTRODUODENOSCOPY (EGD) WITH PROPOFOL N/A 07/07/2016   Procedure: ESOPHAGOGASTRODUODENOSCOPY (EGD) WITH PROPOFOL;  Surgeon: [name], MD;  Location: [facility];  Service: Endoscopy;  Laterality: N/A;  . ESOPHAGOGASTRODUODENOSCOPY (EGD) WITH PROPOFOL N/A 03/28/2019   Procedure: ESOPHAGOGASTRODUODENOSCOPY (EGD) WITH PROPOFOL;  Surgeon: [name], MD;  Location: [facility];  Service: Endoscopy;  Laterality: N/A;  . MAXIMUM ACCESS (MAS)POSTERIOR LUMBAR INTERBODY FUSION (PLIF) 1 LEVEL Left 06/10/2015   Procedure: FOR MAXIMUM ACCESS (MAS) POSTERIOR LUMBAR INTERBODY FUSION (PLIF) LUMBAR THREE-FOUR EXTRAFORAMINAL MICRODISCECTOMY LUMBAR  FIVE-SACRAL ONE LEFT;  Surgeon: Eustace Moore, MD;  Location: [place] [facility];  Service: Neurosurgery;  Laterality: Left;  . TEE WITHOUT CARDIOVERSION N/A 01/28/2016   Procedure: TRANSESOPHAGEAL ECHOCARDIOGRAM (TEE);  Surgeon: Melrose Nakayama, MD;  Location: [place];  Service: Open Heart Surgery;  Laterality: N/A;  . TUBAL LIGATION  1982   Dr Connye Burkitt  . UPPER GASTROINTESTINAL ENDOSCOPY    . VAGINAL HYSTERECTOMY  1997   Dr Rande Lawman    Prior to Admission medications   Medication Sig Start Date End Date Taking? Authorizing Provider  acetaminophen (TYLENOL) 500 MG tablet Take 500 mg by mouth at bedtime.    Yes [provider]  Aromatic Inhalants (VICKS VAPOR IN) Vicks Vapor Rub apply small amount to outside of nose to help breathing   Yes [provider]  aspirin EC 81 MG tablet Take 1 tablet (81 mg total) by mouth daily. 01/16/17  Yes Josue Hector, MD  Biotin 10000 MCG TABS Take 10,000 mcg by mouth every morning.   Yes [provider]  calcium carbonate (OS-CAL) 600 MG TABS Take 600 mg by mouth 2 (two) times daily with a meal.     Yes [provider]  Cholecalciferol (VITAMIN D) 50 MCG (2000 UT) CAPS Take 2,000 Units by mouth daily.    Yes [provider]  Cyanocobalamin (VITAMIN B 12 PO) Take 1,000 mcg by mouth daily.     Yes [provider]  empagliflozin (JARDIANCE) 25 MG TABS tablet Take 25 mg by mouth daily. 01/31/19  Yes Reed, Tiffany L, DO  ezetimibe (ZETIA) 10 MG tablet TAKE 1 TABLET(10 MG) BY MOUTH DAILY Patient taking differently: Take 10 mg by mouth daily.  12/26/18  Yes Reed, Tiffany L, DO  furosemide (LASIX) 40 MG tablet TAKE 1 TABLET(40 MG) BY MOUTH DAILY Patient taking differently: Take 40 mg by mouth daily.  07/23/18  Yes Josue Hector, MD  HYDROcodone-acetaminophen (HYCET) 7.5-325 mg/15 ml solution Take 15 mLs by mouth every 4 (four) hours as needed for pain. 03/29/19  Yes [provider]  insulin detemir (LEVEMIR) 100  UNIT/ML injection Inject 0.48 mLs (48 Units total) into the skin at bedtime. 02/18/19  Yes Reed, Tiffany L, DO  loratadine (CLARITIN) 10 MG tablet Take 10 mg by mouth daily as needed for allergies.   Yes [provider]  MAGNESIUM PO Take 500 mg by mouth 2 (two) times daily in the am and at bedtime..   Yes [provider]  Multiple Vitamins-Minerals (MULTIVITAMIN WITH MINERALS) tablet Take 1 tablet by mouth daily.     Yes [provider]  nadolol (CORGARD) 40 MG tablet Take 1 tablet (40 mg total) by mouth daily. 02/28/19  Yes Reed, Tiffany L, DO  NOVOLOG FLEXPEN 100 UNIT/ML FlexPen Inject 12 units under the skin every morning, 8 units at lunch and 12 units at supper Patient taking differently: Inject 8-12 Units into the skin See admin instructions. Inject 12 units under the skin every morning, 8 units at lunch and 12 units at supper 02/19/19  Yes Reed, Tiffany L, DO  omeprazole (PRILOSEC) 20 MG capsule TAKE 1 CAPSULE BY MOUTH DAILY Patient taking differently: Take 20 mg by mouth daily.  12/11/18  Yes Reed, Tiffany L, DO  Polyethyl Glycol-Propyl Glycol (SYSTANE OP) Place 1 drop into both eyes 2 (two) times daily.   Yes [provider]  potassium chloride SA (KLOR-CON) 20 MEQ tablet Take 1 tablet (20 mEq total) by mouth daily. 03/11/19  Yes Reed, Tiffany L, DO  Probiotic Product (PROBIOTIC DAILY PO) Take 1 capsule by mouth daily. Digestive Advantage Probiotic   Yes [provider]  spironolactone (ALDACTONE) 100 MG tablet TAKE 1 TABLET(100 MG) BY MOUTH TWICE DAILY Patient taking differently: Take 100 mg by mouth 2 (two) times daily.  09/10/18  Yes [name], MD  BD PEN NEEDLE NANO U/F 32G X 4 MM MISC USE THREE TIMES DAILY AS DIRECTED 11/26/18   Reed, Tiffany L, DO  glucose blood test strip One Touch Ultra II strips. Use to test blood sugar three times daily. Dx: E11.65 06/01/17   Reed, Tiffany L, DO  Insulin Syringe-Needle U-100 (INSULIN SYRINGE  1CC/31GX5/16") 31G X 5/16" 1 ML MISC USE AS DIRECTED DAILY WITH LEVEMIR 07/27/18   Reed, Tiffany L, DO    No current facility-administered medications for this encounter.    Current Outpatient Medications  Medication Sig Dispense Refill  . acetaminophen (TYLENOL) 500 MG tablet Take 500 mg by mouth at bedtime.     . Aromatic Inhalants (VICKS VAPOR IN) Vicks Vapor Rub apply small amount to outside of nose to help breathing    . aspirin EC 81 MG tablet Take 1  tablet (81 mg total) by mouth daily. 90 tablet 3  . Biotin 10000 MCG TABS Take 10,000 mcg by mouth every morning.    . calcium carbonate (OS-CAL) 600 MG TABS Take 600 mg by mouth 2 (two) times daily with a meal.      . Cholecalciferol (VITAMIN D) 50 MCG (2000 UT) CAPS Take 2,000 Units by mouth daily.     . Cyanocobalamin (VITAMIN B 12 PO) Take 1,000 mcg by mouth daily.      . empagliflozin (JARDIANCE) 25 MG TABS tablet Take 25 mg by mouth daily. 30 tablet 3  . ezetimibe (ZETIA) 10 MG tablet TAKE 1 TABLET(10 MG) BY MOUTH DAILY (Patient taking differently: Take 10 mg by mouth daily. ) 90 tablet 1  . furosemide (LASIX) 40 MG tablet TAKE 1 TABLET(40 MG) BY MOUTH DAILY (Patient taking differently: Take 40 mg by mouth daily. ) 90 tablet 3  . HYDROcodone-acetaminophen (HYCET) 7.5-325 mg/15 ml solution Take 15 mLs by mouth every 4 (four) hours as needed for pain.    [name] Kitchen insulin detemir (LEVEMIR) 100 UNIT/ML injection Inject 0.48 mLs (48 Units total) into the skin at bedtime. 10 mL 3  . loratadine (CLARITIN) 10 MG tablet Take 10 mg by mouth daily as needed for allergies.    [name] Kitchen MAGNESIUM PO Take 500 mg by mouth 2 (two) times daily in the am and at bedtime..    . Multiple Vitamins-Minerals (MULTIVITAMIN WITH MINERALS) tablet Take 1 tablet by mouth daily.      . nadolol (CORGARD) 40 MG tablet Take 1 tablet (40 mg total) by mouth daily. 90 tablet 1  . NOVOLOG FLEXPEN 100 UNIT/ML FlexPen Inject 12 units under the skin every morning, 8 units at lunch and 12  units at supper (Patient taking differently: Inject 8-12 Units into the skin See admin instructions. Inject 12 units under the skin every morning, 8 units at lunch and 12 units at supper) 15 mL 3  . omeprazole (PRILOSEC) 20 MG capsule TAKE 1 CAPSULE BY MOUTH DAILY (Patient taking differently: Take 20 mg by mouth daily. ) 90 capsule 1  . Polyethyl Glycol-Propyl Glycol (SYSTANE OP) Place 1 drop into both eyes 2 (two) times daily.    . potassium chloride SA (KLOR-CON) 20 MEQ tablet Take 1 tablet (20 mEq total) by mouth daily. 90 tablet 3  . Probiotic Product (PROBIOTIC DAILY PO) Take 1 capsule by mouth daily. Digestive Advantage Probiotic    . spironolactone (ALDACTONE) 100 MG tablet TAKE 1 TABLET(100 MG) BY MOUTH TWICE DAILY (Patient taking differently: Take 100 mg by mouth 2 (two) times daily. ) 180 tablet 1  . BD PEN NEEDLE NANO U/F 32G X 4 MM MISC USE THREE TIMES DAILY AS DIRECTED 100 each 6  . glucose blood test strip One Touch Ultra II strips. Use to test blood sugar three times daily. Dx: E11.65 300 each 3  . Insulin Syringe-Needle U-100 (INSULIN SYRINGE 1CC/31GX5/16") 31G X 5/16" 1 ML MISC USE AS DIRECTED DAILY WITH LEVEMIR 100 each 2    Allergies as of 04/01/2019 - Review Complete 04/01/2019  Allergen Reaction Noted  . Kiwi extract Anaphylaxis 05/28/2015  . Tdap [tetanus-diphth-acell pertussis] Swelling and Other (See Comments) 03/27/2013  . Statins  05/08/2016  . Latex Itching, Dermatitis, and Rash 12/22/2010  . Tramadol Nausea And Vomiting 05/28/2015    Family History  Problem Relation Age of Onset  . Lung cancer Father   . Arthritis Sister   . Arthritis Brother   . Heart  disease Maternal Grandmother   . Heart disease Maternal Grandfather   . Heart disease Paternal Grandmother   . Heart disease Paternal Grandfather   . Breast cancer Mother   . Liver cancer Brother   . Breast cancer Maternal Aunt   . Breast cancer Paternal Aunt   . Colon cancer Neg Hx   . Esophageal cancer  Neg Hx   . Rectal cancer Neg Hx   . Stomach cancer Neg Hx     Social History   Socioeconomic History  . Marital status: Married    Spouse name: Not on file  . Number of children: 6  . Years of education: Not on file  . Highest education level: Not on file  Occupational History  . Occupation: retired  Scientific laboratory technician  . Financial resource strain: Not hard at all  . Food insecurity    Worry: Never true    Inability: Never true  . Transportation needs    Medical: No    Non-medical: No  Tobacco Use  . Smoking status: Never Smoker  . Smokeless tobacco: Never Used  Substance and Sexual Activity  . Alcohol use: No  . Drug use: No  . Sexual activity: Yes    Partners: Male    Birth control/protection: Post-menopausal, Surgical    Comment: Hysterectomy  Lifestyle  . Physical activity    Days per week: 0 days    Minutes per session: 0 min  . Stress: Not at all  Relationships  . Social connections    Talks on phone: More than three times a week    Gets together: Twice a week    Attends religious service: More than 4 times per year    Active member of club or organization: No    Attends meetings of clubs or organizations: Never    Relationship status: Married  . Intimate partner violence    Fear of current or ex partner: No    Emotionally abused: No    Physically abused: No    Forced sexual activity: No  Other Topics Concern  . Not on file  Social History Narrative  . Not on file    Review of Systems: All systems reviewed and negative except where noted in HPI.  Physical Exam: Vital signs in last 24 hours: Temp:  [98.2 F (36.8 C)] 98.2 F (36.8 C) (11/23 1250) Pulse Rate:  [49-55] 54 (11/23 1416) Resp:  [12-17] 17 (11/23 1416) BP: (77-117)/(41-68) 117/68 (11/23 1416) SpO2:  [95 %-98 %] 97 % (11/23 1416) Weight:  [65.8 kg] 65.8 kg (11/23 1256)   General:   Alert, female in NAD Psych:  Pleasant, cooperative. Normal mood and affect. Eyes:  Pupils equal, sclera  clear, no icterus.   Conjunctiva pink. Ears:  Normal auditory acuity. Nose:  No deformity, discharge,  or lesions. Neck:  Supple; no masses Lungs:  Clear throughout to auscultation.   No wheezes, crackles, or rhonchi.  Heart:  Regular rate and rhythm; + murmur, no lower extremity edema Abdomen:  Soft, non-distended, mild diffuse tenderness. BS active, Rectal:  Deferred  Msk:  Symmetrical without gross deformities. . Neurologic:  Alert and  oriented x4;  grossly normal neurologically. Skin:  Intact without significant lesions or rashes.   Intake/Output from previous day: No intake/output data recorded. Intake/Output this shift: Total I/O In: 1000 [IV Piggyback:1000] Out: -   Lab Results: Recent Labs    04/01/19 1319  WBC 8.3  HGB 15.2*  HCT 47.4*  PLT 149*  BMET Recent Labs    04/01/19 1319  NA 135  K 4.8  CL 98  CO2 26  GLUCOSE 160*  BUN 30*  CREATININE 1.20*  CALCIUM 10.1   LFT Recent Labs    04/01/19 1319  PROT 7.9  ALBUMIN 3.3*  AST 55*  ALT 44  ALKPHOS 109  BILITOT 1.0     . CBC Latest Ref Rng & Units 04/01/2019 03/11/2019 01/24/2019  WBC 4.0 - 10.5 K/uL 8.3 5.5 6.2  Hemoglobin 12.0 - 15.0 g/dL 15.2(H) 14.5 15.1  Hematocrit 36.0 - 46.0 % 47.4(H) 43.0 44.6  Platelets 150 - 400 K/uL 149(L) 129.0(L) 132(L)    . CMP Latest Ref Rng & Units 04/01/2019 03/11/2019 01/24/2019  Glucose 70 - 99 mg/dL 160(H) 66(L) 68  BUN 8 - 23 mg/dL 30(H) 26(H) 24  Creatinine 0.44 - 1.00 mg/dL 1.20(H) 1.04 1.11(H)  Sodium 135 - 145 mmol/L 135 140 138  Potassium 3.5 - 5.1 mmol/L 4.8 4.1 4.5  Chloride 98 - 111 mmol/L 98 105 103  CO2 22 - 32 mmol/L 26 26 29   Calcium 8.9 - 10.3 mg/dL 10.1 9.8 9.6  Total Protein 6.5 - 8.1 g/dL 7.9 7.5 7.2  Total Bilirubin 0.3 - 1.2 mg/dL 1.0 0.9 0.9  Alkaline Phos 38 - 126 U/L 109 111 -  AST 15 - 41 U/L 55(H) 59(H) 74(H)  ALT 0 - 44 U/L 44 55(H) 84(H)   Studies/Results: Xr Chest Single View  Result Date: 04/01/2019 CLINICAL DATA:   Epigastric pain. Additional history provided: Patient reports being unable to swallow even small amounts. EXAM: PORTABLE CHEST 1 VIEW COMPARISON:  Fluoroscopic esophagram 03/18/2019, prior chest radiograph 03/29/2016 FINDINGS: Sequela prior median sternotomy/CABG. Heart size within normal limits. Aortic atherosclerosis No airspace consolidation No evidence of pleural effusion or pneumothorax. No acute bony abnormality. Overlying cardiac monitoring leads. IMPRESSION: No airspace consolidation Electronically Signed   By: Kellie Simmering DO   On: 04/01/2019 14:07    Active Problems:   * No active hospital problems. Tye Savoy, NP-C @  04/01/2019, 3:37 PM

## 2019-04-01 NOTE — ED Notes (Addendum)
[name] in main lab verbalizes can add on A1C to collected sample that is in lab from earlier blood draw.  Pt verbalizes had a large BM post suppository. Last BM prior to this one was on Thursday; narcotic use since recent surgery per pt.

## 2019-04-01 NOTE — ED Provider Notes (Signed)
Care assumed from Dr. Sedonia Small.  Patient with esophageal varices status post banding several days ago here with difficulty swallowing tolerating p.o.  She is not bleeding.  Initial hypotension has improved. Hemoglobin is stable. GI has been consulted.  Awaiting hospitalist call.  Admission discussed with Dr. [place]    Ezequiel Essex, MD 04/01/19 1610

## 2019-04-01 NOTE — ED Notes (Addendum)
Pt BP has been in mid 92J systolic over course of 2 hours; most recently noted to be 93 systolic. Hospitalist paged to make aware of BP dropping below mid 90s; see plan of care/MAR.  Pt requesting to use restroom prior to infusion start.

## 2019-04-01 NOTE — Telephone Encounter (Signed)
The pt will be contacted in the next few weeks to set up appt.  Our schedule is not out today

## 2019-04-01 NOTE — Telephone Encounter (Signed)
The pt is calling to report severe pain in the chest. She was in notable discomfort on the phone and was crying with the pain.   She says the pain is worse than when she had "heart surgery".  She was advised to go to the ED for eval.

## 2019-04-01 NOTE — ED Triage Notes (Signed)
Patient states she had esophageal banding 4 days ago. Patient states she is unable to eat and when she swallows she can only swallow small amounts. Patient states her instructions stated after 2 days she should have been eating mashed potatoes and scrambled eggs.

## 2019-04-01 NOTE — Telephone Encounter (Signed)
-----   Message from [name], MD sent at 03/28/2019 12:46 PM EST ----- She needs repeat EGD at [facility] in 5-6 weeks (on a Thursday) for variceal banding.  Thanks

## 2019-04-02 DIAGNOSIS — I130 Hypertensive heart and chronic kidney disease with heart failure and stage 1 through stage 4 chronic kidney disease, or unspecified chronic kidney disease: Secondary | ICD-10-CM | POA: Diagnosis present

## 2019-04-02 DIAGNOSIS — E1122 Type 2 diabetes mellitus with diabetic chronic kidney disease: Secondary | ICD-10-CM | POA: Diagnosis present

## 2019-04-02 DIAGNOSIS — I8500 Esophageal varices without bleeding: Secondary | ICD-10-CM | POA: Diagnosis not present

## 2019-04-02 DIAGNOSIS — E440 Moderate protein-calorie malnutrition: Secondary | ICD-10-CM | POA: Diagnosis not present

## 2019-04-02 DIAGNOSIS — E860 Dehydration: Secondary | ICD-10-CM | POA: Diagnosis present

## 2019-04-02 DIAGNOSIS — K7581 Nonalcoholic steatohepatitis (NASH): Secondary | ICD-10-CM | POA: Diagnosis present

## 2019-04-02 DIAGNOSIS — I959 Hypotension, unspecified: Secondary | ICD-10-CM | POA: Diagnosis present

## 2019-04-02 DIAGNOSIS — I8510 Secondary esophageal varices without bleeding: Secondary | ICD-10-CM | POA: Diagnosis present

## 2019-04-02 DIAGNOSIS — I2510 Atherosclerotic heart disease of native coronary artery without angina pectoris: Secondary | ICD-10-CM | POA: Diagnosis not present

## 2019-04-02 DIAGNOSIS — Z20828 Contact with and (suspected) exposure to other viral communicable diseases: Secondary | ICD-10-CM | POA: Diagnosis present

## 2019-04-02 DIAGNOSIS — Z6825 Body mass index (BMI) 25.0-25.9, adult: Secondary | ICD-10-CM | POA: Diagnosis not present

## 2019-04-02 DIAGNOSIS — K766 Portal hypertension: Secondary | ICD-10-CM | POA: Diagnosis present

## 2019-04-02 DIAGNOSIS — E785 Hyperlipidemia, unspecified: Secondary | ICD-10-CM | POA: Diagnosis present

## 2019-04-02 DIAGNOSIS — K5900 Constipation, unspecified: Secondary | ICD-10-CM | POA: Diagnosis present

## 2019-04-02 DIAGNOSIS — N181 Chronic kidney disease, stage 1: Secondary | ICD-10-CM | POA: Diagnosis present

## 2019-04-02 DIAGNOSIS — R188 Other ascites: Secondary | ICD-10-CM | POA: Diagnosis present

## 2019-04-02 DIAGNOSIS — K2210 Ulcer of esophagus without bleeding: Secondary | ICD-10-CM | POA: Diagnosis present

## 2019-04-02 DIAGNOSIS — K7460 Unspecified cirrhosis of liver: Secondary | ICD-10-CM | POA: Diagnosis not present

## 2019-04-02 DIAGNOSIS — E119 Type 2 diabetes mellitus without complications: Secondary | ICD-10-CM | POA: Diagnosis not present

## 2019-04-02 DIAGNOSIS — I10 Essential (primary) hypertension: Secondary | ICD-10-CM | POA: Diagnosis not present

## 2019-04-02 DIAGNOSIS — N179 Acute kidney failure, unspecified: Secondary | ICD-10-CM | POA: Diagnosis present

## 2019-04-02 DIAGNOSIS — E7849 Other hyperlipidemia: Secondary | ICD-10-CM | POA: Diagnosis not present

## 2019-04-02 DIAGNOSIS — I81 Portal vein thrombosis: Secondary | ICD-10-CM | POA: Diagnosis present

## 2019-04-02 DIAGNOSIS — Z794 Long term (current) use of insulin: Secondary | ICD-10-CM | POA: Diagnosis not present

## 2019-04-02 DIAGNOSIS — K3189 Other diseases of stomach and duodenum: Secondary | ICD-10-CM | POA: Diagnosis present

## 2019-04-02 DIAGNOSIS — M1909 Primary osteoarthritis, other specified site: Secondary | ICD-10-CM | POA: Diagnosis present

## 2019-04-02 DIAGNOSIS — K219 Gastro-esophageal reflux disease without esophagitis: Secondary | ICD-10-CM | POA: Diagnosis not present

## 2019-04-02 DIAGNOSIS — R1310 Dysphagia, unspecified: Secondary | ICD-10-CM | POA: Diagnosis not present

## 2019-04-02 DIAGNOSIS — E1136 Type 2 diabetes mellitus with diabetic cataract: Secondary | ICD-10-CM | POA: Diagnosis present

## 2019-04-02 LAB — COMPREHENSIVE METABOLIC PANEL
ALT: 33 U/L (ref 0–44)
AST: 36 U/L (ref 15–41)
Albumin: 2.6 g/dL — ABNORMAL LOW (ref 3.5–5.0)
Alkaline Phosphatase: 80 U/L (ref 38–126)
Anion gap: 10 (ref 5–15)
BUN: 24 mg/dL — ABNORMAL HIGH (ref 8–23)
CO2: 20 mmol/L — ABNORMAL LOW (ref 22–32)
Calcium: 8.7 mg/dL — ABNORMAL LOW (ref 8.9–10.3)
Chloride: 105 mmol/L (ref 98–111)
Creatinine, Ser: 0.93 mg/dL (ref 0.44–1.00)
GFR calc Af Amer: >60 mL/min (ref >60)
GFR calc non Af Amer: >60 mL/min (ref >60)
Glucose, Bld: 152 mg/dL — ABNORMAL HIGH (ref 70–99)
Potassium: 3.8 mmol/L (ref 3.5–5.1)
Sodium: 135 mmol/L (ref 135–145)
Total Bilirubin: 1.1 mg/dL (ref 0.3–1.2)
Total Protein: 6 g/dL — ABNORMAL LOW (ref 6.5–8.1)

## 2019-04-02 LAB — GLUCOSE, CAPILLARY
Glucose-Capillary: 94 mg/dL (ref 70–99)
Glucose-Capillary: 244 mg/dL — ABNORMAL HIGH (ref 70–99)
Glucose-Capillary: 239 mg/dL — ABNORMAL HIGH (ref 70–99)
Glucose-Capillary: 118 mg/dL — ABNORMAL HIGH (ref 70–99)
Glucose-Capillary: 133 mg/dL — ABNORMAL HIGH (ref 70–99)

## 2019-04-02 LAB — CBC
HCT: 41.8 % (ref 36.0–46.0)
Hemoglobin: 13.1 g/dL (ref 12.0–15.0)
MCH: 31 pg (ref 26.0–34.0)
MCHC: 31.3 g/dL (ref 30.0–36.0)
MCV: 98.8 fL (ref 80.0–100.0)
Platelets: 105 10*3/uL — ABNORMAL LOW (ref 150–400)
RBC: 4.23 MIL/uL (ref 3.87–5.11)
RDW: 13.7 % (ref 11.5–15.5)
WBC: 4.9 10*3/uL (ref 4.0–10.5)
nRBC: 0 % (ref 0.0–0.2)

## 2019-04-02 LAB — PROTIME-INR
INR: 1.2 (ref 0.8–1.2)
Prothrombin Time: 15.1 seconds (ref 11.4–15.2)

## 2019-04-02 MED ORDER — INSULIN DETEMIR 100 UNIT/ML ~~LOC~~ SOLN
25.0000 [IU] | Freq: Every day | SUBCUTANEOUS | Status: DC
  Administered 2019-04-02: 25 [IU] via SUBCUTANEOUS
  Filled 2019-04-02 (×2): qty 0.25

## 2019-04-02 MED ORDER — ENSURE ENLIVE PO LIQD
237.0000 mL | Freq: Two times a day (BID) | ORAL | Status: DC
  Administered 2019-04-02 – 2019-04-03 (×3): 237 mL via ORAL

## 2019-04-02 NOTE — Progress Notes (Signed)
PROGRESS NOTE    CRISTOL ENGDAHL  ZOX:[id number] DOB: Dec 08, 1945 DOA: 04/01/2019 PCP: Gayland Curry, DO    Brief Narrative:   [name] is a 73 y.o. female with medical history significant of NAFLD with cirrhosis, esophageal varices, type 2 diabetes mellitus, essential hypertension, hyperlipidemia who presents from home with complaint of difficulty swallowing with associated pain.  Over the past 4 days, patient reports difficulty even swallowing of water; although states has no issues swallowing her home medications except for her potassium supplement.  She reports recently underwent EGD with esophageal banding by gastroenterology, Dr. [name] on 03/28/2019.  Since the procedure, she reports progressive pain with swallowing and associated spasms.  She reports these episodes are intermittent following oral intake lasting 1-2 minutes before resolving.  She reports very poor oral intake over the past few days.  Additionally, patient reports 2 episodes of emesis on Saturday that is reported nonbloody/nonbilious.  Also reports no bowel movement since last Thursday.  No other complaints or concerns at this time.  Denies headache, no dizziness, no fever/chills/night sweats, no diarrhea, no chest pain, no shortness of breath, no palpitations, no weakness, no fatigue, no paresthesias.  ED Course: In the ED, BP 77/49, RR 17, HR 54, SPO2 97% on room air.  Sodium 135, potassium 4.8, chloride 98, CO2 26, BUN 30, creatinine 1.20, glucose 160.  Lipase 20.  ALT 44, AST 55, total bilirubin 1.0.  Chest x-ray with no acute airspace consolidation.  EDP consulted GI with recommendations of [facility] admission.  Assessment & Plan:   Principal Problem:   Dysphagia Active Problems:   Hyperlipemia   GERD (gastroesophageal reflux disease)   Diabetes mellitus type 2, controlled (HCC)   Essential hypertension   CAD (coronary artery disease)   Cirrhosis of liver without ascites (HCC)   Esophageal varices without  bleeding (HCC)   Odynophagia   Hypotension Patient presenting from home with difficulty swallowing and noted to have BP 77/49 on presentation.  Etiology likely poor oral intake and significant dehydration.  Patient received 2 L NS in ED with improvement of her blood pressure. Patient is afebrile without leukocytosis, unlikely infectious etiology. --continue NS at 75 mL's per hour --Holding home spironolactone and furosemide --monitor BP closely  Dysphagia, odynophagia Patient with continued difficulty swallowing, food and liquids; although able to tolerate oral medications except for potassium supplement.  Recent EGD on 03/28/2019 by Dr. [name] with numerous esophageal varices status post placement of 14 variceal bands.  Etiology likely secondary to recent intervention with EGD/variceal banding versus GI dysmotility. --[facility] consulted --Clear liquid diet --GI started on viscous lidocaine and Carafate --Protonix 40 mg IV daily --Further per gastroenterology  Constipation Patient reports large bowel movement overnight --Dulcolax suppository --Closely monitor bowel movements  NAFLD w/ cirrhosis Esophageal varices Follows with  GI, Dr. [name].  Recent EGD on 03/28/2019 with multiple esophageal varices status post banding x14.  Current home regimen includes Lasix 40 mg p.o. daily, spironolactone 100 mg p.o. twice daily and nadolol 80 mg p.o. daily. --Holding home spironolactone and Lasix secondary to hypotension on admission --Continue nadolol 61m PO daily --Diagnosis Daily weights  Type 2 diabetes mellitus Last hemoglobin A1c 9.7 on 01/24/2019, not optimally controlled.  Repeat hemoglobin A1c on 04/01/2019, 8.4. Home regimen includes Levemir 48 units nightly, Jardiance 25 mg p.o. daily, and NovoLog 12u with breakfast, 8 units with lunch, 12 units with dinner. --Glucose 160 on presentation --On clear liquid diet currently --Increase Levemir from 15 units to 25  units QHS;  still significantly decreased from home dose given poor oral intake --Insulin scale for further coverage --CBG's qAC/HS  HLD: On Zetia 10 mg p.o. daily at home.  Reports allergies to statins.  Will hold home medication at this time.  Essential hypertension Hypotensive on presentation, 77/49.  Received 2 L NS bolus on admission with improvement. --BP 90/52 this morning --Continue to hold home Lasix/spironolactone for now --Nadolol 77m PO daily --check orthostatic vital signs --Monitor blood pressure closely  Moderate protein calorie malnutrition BMI 25.7 with albumin 2.6. --Nutrition consult for supplementation needs   DVT prophylaxis: SCDs, patient reports reaction to chemical DVT prophylaxis in the past. Code Status: Full code Family Communication: No family present at bedside this morning Disposition Plan: Continue inpatient, anticipate discharge home in 1-2 days   Consultants:   [facility] - Dr. SFuller Plan Procedures:   none  Antimicrobials:   none   Subjective: Patient seen and examined at bedside this morning, tearful and emotional.  States no significant change overnight in regards to her odynophagia.  Is able to still tolerate her oral medications.  With Carafate and viscous lidocaine; and clear liquid diet.  No other complaints or concerns this morning.  Denies headache, no dizziness, no chest pain, palpitations, no shortness of breath, no abdominal pain, no weakness, fatigue, no paresthesias.  No acute events overnight per nursing staff.  Objective: Vitals:   04/02/19 0232 04/02/19 0629 04/02/19 0633 04/02/19 1330  BP: (!) 89/47  (!) 90/52 (!) 95/51  Pulse: (!) 59  (!) 55 (!) 57  Resp: 14  14 18   Temp: 98.5 F (36.9 C)  98.2 F (36.8 C) 98.1 F (36.7 C)  TempSrc: Oral  Oral Oral  SpO2: 93%  96% 99%  Weight:  65.9 kg    Height:        Intake/Output Summary (Last 24 hours) at 04/02/2019 1601 Last data filed at 04/02/2019 1400 Gross per 24 hour  Intake  1795.83 ml  Output 1150 ml  Net 645.83 ml   Filed Weights   04/01/19 1256 04/02/19 0629  Weight: 65.8 kg 65.9 kg    Examination:  General exam: Appears calm and comfortable  Respiratory system: Clear to auscultation. Respiratory effort normal. Cardiovascular system: S1 & S2 heard, RRR. No JVD, murmurs, rubs, gallops or clicks. No pedal edema. Gastrointestinal system: Abdomen is nondistended, soft and nontender. No organomegaly or masses felt. Normal bowel sounds heard. Central nervous system: Alert and oriented. No focal neurological deficits. Extremities: Symmetric 5 x 5 power. Skin: No rashes, lesions or ulcers Psychiatry: Judgement and insight appear normal. Mood & affect appropriate.     Data Reviewed: I have personally reviewed following labs and imaging studies  CBC: Recent Labs  Lab 04/01/19 1319 04/02/19 0405  WBC 8.3 4.9  HGB 15.2* 13.1  HCT 47.4* 41.8  MCV 95.4 98.8  PLT 149* 1076   Basic Metabolic Panel: Recent Labs  Lab 04/01/19 1319 04/02/19 0405  NA 135 135  K 4.8 3.8  CL 98 105  CO2 26 20*  GLUCOSE 160* 152*  BUN 30* 24*  CREATININE 1.20* 0.93  CALCIUM 10.1 8.7*   GFR: Estimated Creatinine Clearance: 49.9 mL/min (by C-G formula based on SCr of 0.93 mg/dL). Liver Function Tests: Recent Labs  Lab 04/01/19 1319 04/02/19 0405  AST 55* 36  ALT 44 33  ALKPHOS 109 80  BILITOT 1.0 1.1  PROT 7.9 6.0*  ALBUMIN 3.3* 2.6*   Recent Labs  Lab 04/01/19 1319  LIPASE 20   No results for input(s): AMMONIA in the last 168 hours. Coagulation Profile: Recent Labs  Lab 04/02/19 0405  INR 1.2   Cardiac Enzymes: No results for input(s): CKTOTAL, CKMB, CKMBINDEX, TROPONINI in the last 168 hours. BNP (last 3 results) No results for input(s): PROBNP in the last 8760 hours. HbA1C: Recent Labs    04/01/19 1319  HGBA1C 8.4*   CBG: Recent Labs  Lab 03/28/19 1146 04/01/19 1315 04/01/19 2220 04/02/19 0738 04/02/19 1328  GLUCAP 228* 144* 188*  94 244*   Lipid Profile: No results for input(s): CHOL, HDL, LDLCALC, TRIG, CHOLHDL, LDLDIRECT in the last 72 hours. Thyroid Function Tests: No results for input(s): TSH, T4TOTAL, FREET4, T3FREE, THYROIDAB in the last 72 hours. Anemia Panel: No results for input(s): VITAMINB12, FOLATE, FERRITIN, TIBC, IRON, RETICCTPCT in the last 72 hours. Sepsis Labs: Recent Labs  Lab 04/01/19 1318  LATICACIDVEN 1.9    Recent Results (from the past 240 hour(s))  Novel Coronavirus, NAA (Hosp order, Send-out to Ref Lab; TAT 18-24 hrs     Status: None   Collection Time: 03/25/19 10:25 AM   Specimen: Nasopharyngeal Swab; Respiratory  Result Value Ref Range Status   SARS-CoV-2, NAA NOT DETECTED NOT DETECTED Final    Comment: (NOTE) This nucleic acid amplification test was developed and its performance characteristics determined by [employer]. Nucleic acid amplification tests include PCR and TMA. This test has not been FDA cleared or approved. This test has been authorized by FDA under an Emergency Use Authorization (EUA). This test is only authorized for the duration of time the declaration that circumstances exist justifying the authorization of the emergency use of in vitro diagnostic tests for detection of SARS-CoV-2 virus and/or diagnosis of COVID-19 infection under section 564(b)(1) of the Act, 21 U.S.C. 161WRU-0(A) (1), unless the authorization is terminated or revoked sooner. When diagnostic testing is negative, the possibility of a false negative result should be considered in the context of a patient's recent exposures and the presence of clinical signs and symptoms consistent with COVID-19. An individual without symptoms of COVID- 19 and who is not shedding SARS-CoV-2 vi rus would expect to have a negative (not detected) result in this assay. Performed At: [facility] [address] [place], [place] [id number] Rush Farmer MD YN:[id number]    [place]  Final    Comment: Performed at [facility], [place] [address], [place], [place] [address]  SARS CORONAVIRUS 2 (TAT 6-24 HRS) Nasopharyngeal Nasopharyngeal Swab     Status: None   Collection Time: 04/01/19  3:29 PM   Specimen: Nasopharyngeal Swab  Result Value Ref Range Status   SARS Coronavirus 2 NEGATIVE NEGATIVE Final    Comment: (NOTE) SARS-CoV-2 target nucleic acids are NOT DETECTED. The SARS-CoV-2 RNA is generally detectable in upper and lower respiratory specimens during the acute phase of infection. Negative results do not preclude SARS-CoV-2 infection, do not rule out co-infections with other pathogens, and should not be used as the sole basis for treatment or other patient management decisions. Negative results must be combined with clinical observations, patient history, and epidemiological information. The expected result is Negative. Fact Sheet for Patients: SugarRoll.be Fact Sheet for Healthcare Providers: https://www.woods-mathews.com/ This test is not yet approved or cleared by the [place] FDA and  has been authorized for detection and/or diagnosis of SARS-CoV-2 by FDA under an Emergency Use Authorization (EUA). This EUA will remain  in effect (meaning this test can be used) for the  duration of the COVID-19 declaration under Section 56 4(b)(1) of the Act, 21 U.S.C. section 360bbb-3(b)(1), unless the authorization is terminated or revoked sooner. Performed at [facility], [place] [address] [place], [place] [address]          Radiology Studies: Xr Chest Single View  Result Date: 04/01/2019 CLINICAL DATA:  Epigastric pain. Additional history provided: Patient reports being unable to swallow even small amounts. EXAM: PORTABLE CHEST 1 VIEW COMPARISON:  Fluoroscopic esophagram 03/18/2019, prior chest radiograph 03/29/2016 FINDINGS: Sequela prior median sternotomy/CABG. Heart size within normal  limits. Aortic atherosclerosis No airspace consolidation No evidence of pleural effusion or pneumothorax. No acute bony abnormality. Overlying cardiac monitoring leads. IMPRESSION: No airspace consolidation Electronically Signed   By: Kellie Simmering DO   On: 04/01/2019 14:07        Scheduled Meds: . bisacodyl  10 mg Rectal Daily  . feeding supplement (ENSURE ENLIVE)  237 mL Oral BID BM  . insulin aspart  0-5 Units Subcutaneous QHS  . insulin aspart  0-9 Units Subcutaneous TID WC  . insulin detemir  15 Units Subcutaneous QHS  . lidocaine  15 mL Mouth/Throat TID AC  . nadolol  40 mg Oral Daily  . pantoprazole (PROTONIX) IV  40 mg Intravenous Daily  . sucralfate  1 g Oral TID WC & HS   Continuous Infusions: . sodium chloride 75 mL/hr at 04/01/19 2129     LOS: 0 days    Time spent: 34 minutes spent on chart review, discussion with nursing staff, consultants, updating family and interview/physical exam; more than 50% of that time was spent in counseling and/or coordination of care.    Eric J [place] DO [facility] 04/02/2019, 4:01 PM

## 2019-04-02 NOTE — Progress Notes (Addendum)
Patient ID: [name], female   DOB: Apr 13, 1946, 73 y.o.   MRN: [id number]    Progress Note   Subjective   Day # 2 CC; decompensated cirrhosis/severe odynophagia post banding of varices x14, on 03/27/2000  Creatinine 0.93 improved, K 3.8 WBC 4.9/hemoglobin 13.1/platelets 105  Patient says her pain is about the same, viscous lidocaine helping some.  She is taking clear liquids and does not want to advance her diet.   Objective   Vital signs in last 24 hours: Temp:  [97.7 F (36.5 C)-98.5 F (36.9 C)] 98.2 F (36.8 C) (11/24 0045) Pulse Rate:  [49-59] 55 (11/24 0633) Resp:  [12-25] 14 (11/24 9977) BP: (77-117)/(41-68) 90/52 (11/24 4142) SpO2:  [93 %-100 %] 96 % (11/24 3953) Weight:  [65.8 kg-65.9 kg] 65.9 kg (11/24 0629) Last BM Date: 04/01/19 General:   Older white female in NAD Heart:  Regular rate and rhythm; no murmurs Lungs: Respirations even and unlabored, lungs CTA bilaterally Abdomen:  Soft, nontender and nondistended. Normal bowel sounds. Extremities:  Without edema. Neurologic:  Alert and oriented,  grossly normal neurologically. Psych:  Cooperative. Normal mood and affect.  Intake/Output from previous day: 11/23 0701 - 11/24 0700 In: 1835.9 [P.O.:200; I.V.:635.9; IV Piggyback:1000] Out: 550 [Urine:550] Intake/Output this shift: No intake/output data recorded.  Lab Results: Recent Labs    04/01/19 1319 04/02/19 0405  WBC 8.3 4.9  HGB 15.2* 13.1  HCT 47.4* 41.8  PLT 149* 105*   BMET Recent Labs    04/01/19 1319 04/02/19 0405  NA 135 135  K 4.8 3.8  CL 98 105  CO2 26 20*  GLUCOSE 160* 152*  BUN 30* 24*  CREATININE 1.20* 0.93  CALCIUM 10.1 8.7*   LFT Recent Labs    04/02/19 0405  PROT 6.0*  ALBUMIN 2.6*  AST 36  ALT 33  ALKPHOS 80  BILITOT 1.1   PT/INR Recent Labs    04/02/19 0405  LABPROT 15.1  INR 1.2    Studies/Results: Xr Chest Single View  Result Date: 04/01/2019 CLINICAL DATA:  Epigastric pain. Additional  history provided: Patient reports being unable to swallow even small amounts. EXAM: PORTABLE CHEST 1 VIEW COMPARISON:  Fluoroscopic esophagram 03/18/2019, prior chest radiograph 03/29/2016 FINDINGS: Sequela prior median sternotomy/CABG. Heart size within normal limits. Aortic atherosclerosis No airspace consolidation No evidence of pleural effusion or pneumothorax. No acute bony abnormality. Overlying cardiac monitoring leads. IMPRESSION: No airspace consolidation Electronically Signed   By: Kellie Simmering DO   On: 04/01/2019 14:07       Assessment / Plan:    #75 73 year old white female with decompensated [place] cirrhosis, complicated by esophageal varices, admitted yesterday with severe odynophagia and poor p.o. intake after undergoing outpatient EGD with banding of varices x14 on 03/28/2019  She is stable, still having significant odynophagia.  No evidence of bleeding.   #2 adult onset diabetes mellitus-insulin-dependent #3 coronary artery disease  Plan:  Continue clear liquids today, add Ensure 3 times daily between meals. Continue IV Protonix Continue Carafate suspension between meals and at bedtime Continue viscous lidocaine 20 minutes before meals  Possible discharge home tomorrow   Principal Problem:   Dysphagia Active Problems:   Hyperlipemia   GERD (gastroesophageal reflux disease)   Diabetes mellitus type 2, controlled (HCC)   Essential hypertension   CAD (coronary artery disease)   Cirrhosis of liver without ascites (HCC)   Esophageal varices without bleeding ([place])   Odynophagia    LOS: 0 days   Amy Esterwood PA-C  04/02/2019, 9:09 AM      Attending Physician Note   I have taken an interval history, reviewed the chart and examined the patient. I agree with the Advanced Practitioner's note, impression and recommendations.   Lucio Edward, MD [facility]

## 2019-04-03 ENCOUNTER — Telehealth: Payer: Self-pay | Admitting: Internal Medicine

## 2019-04-03 ENCOUNTER — Telehealth: Payer: Self-pay | Admitting: *Deleted

## 2019-04-03 LAB — BASIC METABOLIC PANEL
Anion gap: 7 (ref 5–15)
BUN: 15 mg/dL (ref 8–23)
CO2: 21 mmol/L — ABNORMAL LOW (ref 22–32)
Calcium: 8.8 mg/dL — ABNORMAL LOW (ref 8.9–10.3)
Chloride: 107 mmol/L (ref 98–111)
Creatinine, Ser: 0.77 mg/dL (ref 0.44–1.00)
GFR calc Af Amer: >60 mL/min (ref >60)
GFR calc non Af Amer: >60 mL/min (ref >60)
Glucose, Bld: 83 mg/dL (ref 70–99)
Potassium: 3.8 mmol/L (ref 3.5–5.1)
Sodium: 135 mmol/L (ref 135–145)

## 2019-04-03 LAB — CBC WITH DIFFERENTIAL/PLATELET
Abs Immature Granulocytes: 0.02 10*3/uL (ref 0.00–0.07)
Basophils Absolute: 0.1 10*3/uL (ref 0.0–0.1)
Basophils Relative: 1 %
Eosinophils Absolute: 0.1 10*3/uL (ref 0.0–0.5)
Eosinophils Relative: 3 %
HCT: 41.9 % (ref 36.0–46.0)
Hemoglobin: 13.6 g/dL (ref 12.0–15.0)
Immature Granulocytes: 1 %
Lymphocytes Relative: 26 %
Lymphs Abs: 1.1 10*3/uL (ref 0.7–4.0)
MCH: 31.3 pg (ref 26.0–34.0)
MCHC: 32.5 g/dL (ref 30.0–36.0)
MCV: 96.3 fL (ref 80.0–100.0)
Monocytes Absolute: 0.7 10*3/uL (ref 0.1–1.0)
Monocytes Relative: 15 %
Neutro Abs: 2.4 10*3/uL (ref 1.7–7.7)
Neutrophils Relative %: 54 %
Platelets: 109 10*3/uL — ABNORMAL LOW (ref 150–400)
RBC: 4.35 MIL/uL (ref 3.87–5.11)
RDW: 13.8 % (ref 11.5–15.5)
WBC: 4.5 10*3/uL (ref 4.0–10.5)
nRBC: 0 % (ref 0.0–0.2)
nRBC: 0 /100 WBC

## 2019-04-03 LAB — GLUCOSE, CAPILLARY: Glucose-Capillary: 97 mg/dL (ref 70–99)

## 2019-04-03 MED ORDER — PANTOPRAZOLE SODIUM 40 MG PO TBEC: 40.0000 mg | DELAYED_RELEASE_TABLET | Freq: Every day | ORAL | 1 refills | Status: DC

## 2019-04-03 MED ORDER — LIDOCAINE VISCOUS HCL 2 % MT SOLN: 15.0000 mL | Freq: Three times a day (TID) | OROMUCOSAL | 0 refills | Status: DC

## 2019-04-03 MED ORDER — SUCRALFATE 1 GM/10ML PO SUSP: 1.0000 g | Freq: Three times a day (TID) | ORAL | 0 refills | Status: DC

## 2019-04-03 MED ORDER — HYDROCODONE-ACETAMINOPHEN 7.5-325 MG/15ML PO SOLN: 15.0000 mL | Freq: Four times a day (QID) | ORAL | 0 refills | Status: DC | PRN

## 2019-04-03 MED ORDER — INSULIN DETEMIR 100 UNIT/ML ~~LOC~~ SOLN: 25.0000 [IU] | Freq: Every day | SUBCUTANEOUS | 3 refills | Status: DC

## 2019-04-03 MED FILL — HYDROCOD-APAP 7.5-325/15ML: 7.5-325 | 2 days supply | Qty: 120 | Fill #0

## 2019-04-03 NOTE — Telephone Encounter (Signed)
Disregard, additional telephone encounter made in error.

## 2019-04-03 NOTE — Discharge Summary (Signed)
Physician Discharge Summary  OTILA STARN YHC:[id number] DOB: 04-Oct-1945 DOA: 04/01/2019  PCP: Gayland Curry, DO  Admit date: 04/01/2019 Discharge date: 04/03/2019  Admitted From: Home Disposition: Home  Recommendations for Outpatient Follow-up:  1. Follow up with PCP in 1-2 weeks 2. Follow-up with gastroenterology as scheduled, Dr. [name] for repeat EGD and possible repeat banding; GI to arrange appointment 3. Discharge home on viscous lidocaine, hydrocodone elixir, Protonix, Carafate for odynophagia. 4. Recommend full liquid diet and advance as tolerated 5. Ensure or boost 2-3 times daily between meals 6. Decrease dose of Levemir secondary to poor oral intake, 25 units daily, may advance back to her normal dose of 48 units daily once appetite and dysphagia/odynophagia improves  Home Health: No Equipment/Devices: None  Discharge Condition: Stable CODE STATUS: Full code Diet recommendation: Full liquid diet, advance as tolerated.  Ensure or boost 2-3 times daily between meals.  History of present illness:  [name] a 73 y.o.femalewith medical history significant ofNAFLD withcirrhosis, esophageal varices, type 2 diabetes mellitus, essential hypertension, hyperlipidemia who presents from home with complaint of difficulty swallowing with associated pain. Over the past 4 days, patient reports difficulty even swallowing of water; although states has no issues swallowing her home medications except for her potassium supplement. She reports recently underwent EGD with esophageal banding by gastroenterology, Dr. [name] on 03/28/2019. Since the procedure, she reports progressive pain with swallowing and associated spasms. She reports these episodes are intermittent following oral intake lasting 1-2 minutes before resolving. She reports very poor oral intake over the past few days. Additionally, patient reports 2 episodes of emesis on Saturday that is reported  nonbloody/nonbilious. Also reports no bowel movement since last Thursday. No other complaints or concerns at this time. Denies headache, no dizziness, no fever/chills/night sweats, no diarrhea, no chest pain, no shortness of breath, no palpitations, no weakness, no fatigue, no paresthesias.  ED Course:In the ED, BP 77/49, RR 17, HR 54, SPO2 97% on room air. Sodium 135, potassium 4.8, chloride 98, CO2 26, BUN 30, creatinine 1.20,glucose 160. Lipase 20. ALT 44, AST 55, total bilirubin 1.0. Chest x-ray with no acute airspace consolidation. EDP consulted GI with recommendations of [facility] admission.  Hospital course:  Dysphagia,odynophagia Patient with continued difficulty swallowing, food and liquids; although able to tolerate oral medications except for potassium supplement. Recent EGD on 03/28/2019 by Dr. [name] with numerous esophageal varices status post placement of 14 variceal bands. Etiology likely secondary to recent intervention with EGD/variceal banding versus GI dysmotility. [facility] follow during the hospital course.  Patient was started on a clear liquid diet, viscous lidocaine, Carafate and Protonix.  Patient symptoms improved and her diet was advanced to a full liquid diet.  Gastroenterology okay for discharge home and recommend to continue viscous lidocaine with meals, hydrocodone elixir, Protonix, Carafate 1 g 3 times daily between meals and at bedtime, and full liquid diet in which she can advance as tolerated.  Also recommend Ensure or boost 2-3 times daily between meals.  Will need follow-up with GI outpatient for likely repeat EGD and possible rebanding of esophageal varices in a few weeks, GI to arrange appointment.  Hypotension Hx essential hypertension Patient presenting from home with difficulty swallowing and noted to have BP 77/49 on presentation. Etiology likely poor oral intake and significant dehydration. Patient received 2 L NS in ED with improvement of her blood  pressure.Patient is afebrile without leukocytosis, unlikely infectious etiology.  Patient was started on IV fluid hydration and her home spironolactone  and furosemide were held.  Patient's oral intake improved with resolution of her hypertension.  May resume home spironolactone and furosemide following discharge.  Constipation Resolved following rectal suppository.  NAFLD w/cirrhosis Esophageal varices Follows with [facility],Dr. [name]. Recent EGD on 03/28/2019 with multiple esophageal varices status post banding x14. Current home regimen includes Lasix 40 mg p.o. daily, spironolactone 100 mg p.o. twice daily and nadolol 80 mg p.o. daily.  Initially held home spironolactone and Lasix secondary to hypotension on admission, can resume on discharge. Continue nadolol1m PO daily.  Type 2 diabetes mellitus Last hemoglobin A1c 9.7 on 01/24/2019, not optimally controlled.  Repeat hemoglobin A1c on 04/01/2019, 8.4.Home regimen includes Levemir 48 units nightly, Jardiance 25 mg p.o. daily, and NovoLog 12uwith breakfast, 8 units with lunch, 12 units with dinner.  Given her poor oral intake, her Levemir was decreased to 25 units nightly with good control of her blood glucose.  Given that she is only tolerating full liquid diet, will continue to recommend 25 units daily of Levemir until her oral intake improves.  May resume home Jardiance.  HNWG:NFAOZHYQhome Zetia 10 mg p.o. daily at home. Reports allergies to statins.   Moderate protein calorie malnutrition BMI 25.7 with albumin 2.6.  Recommend Ensure or boost 2-3 times daily between meals.  Discharge Diagnoses:  Principal Problem:   Dysphagia Active Problems:   Hyperlipemia   GERD (gastroesophageal reflux disease)   Diabetes mellitus type 2, controlled (HCC)   Essential hypertension   CAD (coronary artery disease)   Cirrhosis of liver without ascites (HCC)   Esophageal varices without bleeding ([place]   Odynophagia    Discharge  Instructions  Discharge Instructions    Call MD for:  difficulty breathing, headache or visual disturbances   Complete by: As directed    Call MD for:  extreme fatigue   Complete by: As directed    Call MD for:  persistant dizziness or light-headedness   Complete by: As directed    Call MD for:  persistant nausea and vomiting   Complete by: As directed    Call MD for:  severe uncontrolled pain   Complete by: As directed    Call MD for:  temperature >100.4   Complete by: As directed    Diet full liquid   Complete by: As directed    Full code diet and advance as tolerated   Discharge instructions   Complete by: As directed    Continue reduced dose of home Levemir 25 units daily until diet further advanced, then may resume home dose of 48 units daily.  Continue full liquid diet and advance as tolerated  Ensure or boost 2-3 times daily between meals.   Increase activity slowly   Complete by: As directed      Allergies as of 04/03/2019      Reactions   Kiwi Extract Anaphylaxis   Tdap [tetanus-diphth-acell Pertussis] Swelling, Other (See Comments)   Swelling at injection site, gets very hot   Statins    RHABDOMYOLYSIS   Latex Itching, Dermatitis, Rash   Tramadol Nausea And Vomiting      Medication List    STOP taking these medications   omeprazole 20 MG capsule Commonly known as: PRILOSEC     TAKE these medications   acetaminophen 500 MG tablet Commonly known as: TYLENOL Take 500 mg by mouth at bedtime.   aspirin EC 81 MG tablet Take 1 tablet (81 mg total) by mouth daily.   BD Pen Needle Nano U/F 32G  X 4 MM Misc Generic drug: Insulin Pen Needle USE THREE TIMES DAILY AS DIRECTED   Biotin 10000 MCG Tabs Take 10,000 mcg by mouth every morning.   calcium carbonate 600 MG Tabs tablet Commonly known as: OS-CAL Take 600 mg by mouth 2 (two) times daily with a meal.   ezetimibe 10 MG tablet Commonly known as: ZETIA TAKE 1 TABLET(10 MG) BY MOUTH DAILY What  changed: See the new instructions.   furosemide 40 MG tablet Commonly known as: LASIX TAKE 1 TABLET(40 MG) BY MOUTH DAILY What changed: See the new instructions.   glucose blood test strip One Touch Ultra II strips. Use to test blood sugar three times daily. Dx: E11.65   HYDROcodone-acetaminophen 7.5-325 mg/15 ml solution Commonly known as: HYCET Take 15 mLs by mouth every 6 (six) hours as needed for up to 10 days for moderate pain. What changed:   when to take this  reasons to take this   insulin detemir 100 UNIT/ML injection Commonly known as: Levemir Inject 0.25 mLs (25 Units total) into the skin at bedtime. Decrease dose from 48 units to 25 units until diet further advanced.  Then may resume home dose. What changed:   how much to take  additional instructions   INSULIN SYRINGE 1CC/31GX5/16" 31G X 5/16" 1 ML Misc USE AS DIRECTED DAILY WITH LEVEMIR   Jardiance 25 MG Tabs tablet Generic drug: empagliflozin Take 25 mg by mouth daily.   lidocaine 2 % solution Commonly known as: XYLOCAINE Use as directed 15 mLs in the mouth or throat 3 (three) times daily before meals for 14 days.   loratadine 10 MG tablet Commonly known as: CLARITIN Take 10 mg by mouth daily as needed for allergies.   MAGNESIUM PO Take 500 mg by mouth 2 (two) times daily in the am and at bedtime..   multivitamin with minerals tablet Take 1 tablet by mouth daily.   nadolol 40 MG tablet Commonly known as: CORGARD Take 1 tablet (40 mg total) by mouth daily.   NovoLOG FlexPen 100 UNIT/ML FlexPen Generic drug: insulin aspart Inject 12 units under the skin every morning, 8 units at lunch and 12 units at supper What changed:   how much to take  how to take this  when to take this   pantoprazole 40 MG tablet Commonly known as: Protonix Take 1 tablet (40 mg total) by mouth daily.   potassium chloride SA 20 MEQ tablet Commonly known as: KLOR-CON Take 1 tablet (20 mEq total) by mouth daily.    PROBIOTIC DAILY PO Take 1 capsule by mouth daily. Digestive Advantage Probiotic   spironolactone 100 MG tablet Commonly known as: ALDACTONE TAKE 1 TABLET(100 MG) BY MOUTH TWICE DAILY What changed: See the new instructions.   sucralfate 1 GM/10ML suspension Commonly known as: CARAFATE Take 10 mLs (1 g total) by mouth 4 (four) times daily -  with meals and at bedtime for 14 days.   SYSTANE OP Place 1 drop into both eyes 2 (two) times daily.   VICKS VAPOR IN Vicks Vapor Rub apply small amount to outside of nose to help breathing   VITAMIN B 12 PO Take 1,000 mcg by mouth daily.   Vitamin D 50 MCG (2000 UT) Caps Take 2,000 Units by mouth daily.      Follow-up Information    Reed, Tiffany L, DO. Schedule an appointment as soon as possible for a visit in 1 week(s).   Specialty: Geriatric Medicine Contact information: [address]  [place] [address] [phone]        [name], MD. Schedule an appointment as soon as possible for a visit in 2 week(s).   Specialty: Gastroenterology Contact information: [address] [address] [phone]          Allergies  Allergen Reactions  . Kiwi Extract Anaphylaxis  . Tdap [Tetanus-Diphth-Acell Pertussis] Swelling and Other (See Comments)    Swelling at injection site, gets very hot  . Statins     RHABDOMYOLYSIS  . Latex Itching, Dermatitis and Rash  . Tramadol Nausea And Vomiting    Consultations:  [facility] - Dr. Fuller Plan   Procedures/Studies: Xr Chest Single View  Result Date: 04/01/2019 CLINICAL DATA:  Epigastric pain. Additional history provided: Patient reports being unable to swallow even small amounts. EXAM: PORTABLE CHEST 1 VIEW COMPARISON:  Fluoroscopic esophagram 03/18/2019, prior chest radiograph 03/29/2016 FINDINGS: Sequela prior median sternotomy/CABG. Heart size within normal limits. Aortic atherosclerosis No airspace consolidation No evidence of pleural effusion or  pneumothorax. No acute bony abnormality. Overlying cardiac monitoring leads. IMPRESSION: No airspace consolidation Electronically Signed   By: Kellie Simmering DO   On: 04/01/2019 14:07   US Abdomen Limited Ruq  Result Date: 03/18/2019 CLINICAL DATA:  Cirrhosis EXAM: ULTRASOUND ABDOMEN LIMITED RIGHT UPPER QUADRANT COMPARISON:  2019 FINDINGS: Gallbladder: Calculi measuring up to 9 mm. Mild wall thickening is again noted. No sonographic Murphy sign noted by sonographer. Common bile duct: Diameter: 4 mm Liver: No focal lesion identified. Heterogeneous echotexture likely related to known cirrhosis. Portal vein is patent on color Doppler imaging with normal direction of blood flow towards the liver. Other: Small volume perihepatic ascites. IMPRESSION: No hepatic lesion identified. Small volume perihepatic ascites. Cholelithiasis. Electronically Signed   By: Macy Mis M.D.   On: 03/18/2019 09:14   Dg Esophagus W Single Cm (sol Or Thin Ba)  Result Date: 03/18/2019 CLINICAL DATA:  Food and pills get stuck during swallowing. History of cirrhosis and distal paraesophageal varices. Most recent endoscopy 07/07/2016 EXAM: ESOPHOGRAM / BARIUM SWALLOW / BARIUM TABLET STUDY TECHNIQUE: Combined double contrast and single contrast examination performed using effervescent crystals, thick barium liquid, and thin barium liquid. The patient was observed with fluoroscopy swallowing a 13 mm barium sulphate tablet. FLUOROSCOPY TIME:  Fluoroscopy Time:  2.0 minutes Radiation Exposure Index (if provided by the fluoroscopic device): 11.8 mGy Number of Acquired Spot Images: 0 COMPARISON:  Report from 03/19/1999 FINDINGS: During the pharyngeal phase of swallowing there was laryngeal penetration to the level of the cords (image 1, series 2) Primary peristaltic waves in the esophagus were intact on 4/4 swallows. Large transient filling defects in the distal third of the esophagus are compatible with soft gel varices. Fold thickening in  the distal esophagus suggesting esophagitis. Reduced sensitivity for small sirs given the degree of para soft gel varices and resulting impressions/irregularity, but I do not see definite ulceration. No compelling mind findings of mass separate from the mass effect from the varices. A 13 mm barium tab tablet initially impacted in the distal esophagus but with additional swallows of contrast medium passed into the stomach. I do not see a discrete stricture. IMPRESSION: 1. Prominent paraesophageal varices in the distal third of the esophagus. 2. Fold thickening in the distal esophagus suggesting esophagitis. I do not see definite ulceration or mass, although negative predictive value is adversely affected by the varices. 3. No significant dysmotility. 4. Single episode of laryngeal penetration to the level of the cords during the  pharyngeal phase of swallowing. Electronically Signed   By: Van Clines M.D.   On: 03/18/2019 10:05      Subjective: Patient seen and examined at bedside, resting comfortably.  States pain with swallowing much improved.  States able to accommodate her current dietary changes at home.  Evaluated by GI this morning, okay to discharge home with outpatient follow-up.  No other complaints or concerns at this time.  Denies headache, no dizziness, no chest pain, no shortness of breath, no abdominal pain, no weakness, no fatigue, no paresthesias, no cough/congestion.  No acute events overnight per nursing staff.   Discharge Exam: Vitals:   04/02/19 1330 04/03/19 0558  BP: (!) 95/51 115/68  Pulse: (!) 57 (!) 54  Resp: 18 16  Temp: 98.1 F (36.7 C) 98.1 F (36.7 C)  SpO2: 99% 97%   Vitals:   04/02/19 0633 04/02/19 1330 04/03/19 0500 04/03/19 0558  BP: (!) 90/52 (!) 95/51  115/68  Pulse: (!) 55 (!) 57  (!) 54  Resp: 14 18  16   Temp: 98.2 F (36.8 C) 98.1 F (36.7 C)  98.1 F (36.7 C)  TempSrc: Oral Oral  Oral  SpO2: 96% 99%  97%  Weight:   66.9 kg   Height:         General: Pt is alert, awake, not in acute distress, thin in appearance Cardiovascular: RRR, S1/S2 +, no rubs, no gallops Respiratory: CTA bilaterally, no wheezing, no rhonchi Abdominal: Soft, NT, ND, bowel sounds + Extremities: no edema, no cyanosis    The results of significant diagnostics from this hospitalization (including imaging, microbiology, ancillary and laboratory) are listed below for reference.     Microbiology: Recent Results (from the past 240 hour(s))  Novel Coronavirus, NAA (Hosp order, Send-out to Ref Lab; TAT 18-24 hrs     Status: None   Collection Time: 03/25/19 10:25 AM   Specimen: Nasopharyngeal Swab; Respiratory  Result Value Ref Range Status   SARS-CoV-2, NAA NOT DETECTED NOT DETECTED Final    Comment: (NOTE) This nucleic acid amplification test was developed and its performance characteristics determined by [employer] and [employer]. Nucleic acid amplification tests include PCR and TMA. This test has not been FDA cleared or approved. This test has been authorized by FDA under an Emergency Use Authorization (EUA). This test is only authorized for the duration of time the declaration that circumstances exist justifying the authorization of the emergency use of in vitro diagnostic tests for detection of SARS-CoV-2 virus and/or diagnosis of COVID-19 infection under section 564(b)(1) of the Act, 21 U.S.C. 161WRU-0(A) (1), unless the authorization is terminated or revoked sooner. When diagnostic testing is negative, the possibility of a false negative result should be considered in the context of a patient's recent exposures and the presence of clinical signs and symptoms consistent with COVID-19. An individual without symptoms of COVID- 19 and who is not shedding SARS-CoV-2 vi rus would expect to have a negative (not detected) result in this assay. Performed At: [facility] [address] [place], [place] [id number] Rush Farmer MD YN:[id number]     [place]  Final    Comment: Performed at [facility], [place] [address], [place], [place] [address]  SARS CORONAVIRUS 2 (TAT 6-24 HRS) Nasopharyngeal Nasopharyngeal Swab     Status: None   Collection Time: 04/01/19  3:29 PM   Specimen: Nasopharyngeal Swab  Result Value Ref Range Status   SARS Coronavirus 2 NEGATIVE NEGATIVE Final    Comment: (NOTE) SARS-CoV-2 target  nucleic acids are NOT DETECTED. The SARS-CoV-2 RNA is generally detectable in upper and lower respiratory specimens during the acute phase of infection. Negative results do not preclude SARS-CoV-2 infection, do not rule out co-infections with other pathogens, and should not be used as the sole basis for treatment or other patient management decisions. Negative results must be combined with clinical observations, patient history, and epidemiological information. The expected result is Negative. Fact Sheet for Patients: SugarRoll.be Fact Sheet for Healthcare Providers: https://www.woods-mathews.com/ This test is not yet approved or cleared by the [place] FDA and  has been authorized for detection and/or diagnosis of SARS-CoV-2 by FDA under an Emergency Use Authorization (EUA). This EUA will remain  in effect (meaning this test can be used) for the duration of the COVID-19 declaration under Section 56 4(b)(1) of the Act, 21 U.S.C. section 360bbb-3(b)(1), unless the authorization is terminated or revoked sooner. Performed at [facility], [place] [address] [place], [place] [address]      Labs: BNP (last 3 results) No results for input(s): BNP in the last 8760 hours. Basic Metabolic Panel: Recent Labs  Lab 04/01/19 1319 04/02/19 0405 04/03/19 0359  NA 135 135 135  K 4.8 3.8 3.8  CL 98 105 107  CO2 26 20* 21*  GLUCOSE 160* 152* 83  BUN 30* 24* 15  CREATININE 1.20* 0.93 0.77  CALCIUM 10.1 8.7* 8.8*   Liver Function Tests: Recent  Labs  Lab 04/01/19 1319 04/02/19 0405  AST 55* 36  ALT 44 33  ALKPHOS 109 80  BILITOT 1.0 1.1  PROT 7.9 6.0*  ALBUMIN 3.3* 2.6*   Recent Labs  Lab 04/01/19 1319  LIPASE 20   No results for input(s): AMMONIA in the last 168 hours. CBC: Recent Labs  Lab 04/01/19 1319 04/02/19 0405 04/03/19 0359  WBC 8.3 4.9 4.5  NEUTROABS  --   --  2.4  HGB 15.2* 13.1 13.6  HCT 47.4* 41.8 41.9  MCV 95.4 98.8 96.3  PLT 149* 105* 109*   Cardiac Enzymes: No results for input(s): CKTOTAL, CKMB, CKMBINDEX, TROPONINI in the last 168 hours. BNP: Invalid input(s): POCBNP CBG: Recent Labs  Lab 04/02/19 1328 04/02/19 1647 04/02/19 2131 04/02/19 2159 04/03/19 0747  GLUCAP 244* 239* 118* 133* 97   D-Dimer No results for input(s): DDIMER in the last 72 hours. Hgb A1c Recent Labs    04/01/19 1319  HGBA1C 8.4*   Lipid Profile No results for input(s): CHOL, HDL, LDLCALC, TRIG, CHOLHDL, LDLDIRECT in the last 72 hours. Thyroid function studies No results for input(s): TSH, T4TOTAL, T3FREE, THYROIDAB in the last 72 hours.  Invalid input(s): FREET3 Anemia work up No results for input(s): VITAMINB12, FOLATE, FERRITIN, TIBC, IRON, RETICCTPCT in the last 72 hours. Urinalysis    Component Value Date/Time   COLORURINE YELLOW 06/19/2017 1228   APPEARANCEUR CLEAR 06/19/2017 1228   LABSPEC 1.007 06/19/2017 1228   PHURINE 7.0 06/19/2017 1228   GLUCOSEU NEGATIVE 06/19/2017 1228   HGBUR TRACE (A) 06/19/2017 [place] 01/27/2016 2016   BILIRUBINUR neg 05/05/2012 [place] 06/19/2017 1228   PROTEINUR NEGATIVE 06/19/2017 1228   UROBILINOGEN 0.2 05/05/2012 1445   UROBILINOGEN 0.2 12/18/2011 1409   NITRITE NEGATIVE 06/19/2017 1228   LEUKOCYTESUR 3+ (A) 06/19/2017 1228   Sepsis Labs Invalid input(s): PROCALCITONIN,  WBC,  LACTICIDVEN Microbiology Recent Results (from the past 240 hour(s))  Novel Coronavirus, NAA (Hosp order, Send-out to Ref Lab; TAT 18-24  hrs  Status: None   Collection Time: 03/25/19 10:25 AM   Specimen: Nasopharyngeal Swab; Respiratory  Result Value Ref Range Status   SARS-CoV-2, NAA NOT DETECTED NOT DETECTED Final    Comment: (NOTE) This nucleic acid amplification test was developed and its performance characteristics determined by [employer], [employer] and [employer]. Nucleic acid amplification tests include PCR and TMA. This test has not been FDA cleared or approved. This test has been authorized by FDA under an Emergency Use Authorization (EUA). This test is only authorized for the duration of time the declaration that circumstances exist justifying the authorization of the emergency use of in vitro diagnostic tests for detection of SARS-CoV-2 virus and/or diagnosis of COVID-19 infection under section 564(b)(1) of the Act, 21 U.S.C. 480XKP-5(V) (1), unless the authorization is terminated or revoked sooner. When diagnostic testing is negative, the possibility of a false negative result should be considered in the context of a patient's recent exposures and the presence of clinical signs and symptoms consistent with COVID-19. An individual without symptoms of COVID- 19 and who is not shedding SARS-CoV-2 vi rus would expect to have a negative (not detected) result in this assay. Performed At: [facility] [address] [place], [place] [id number] Rush Farmer MD LJ:[id number]    [place]  Final    Comment: Performed at [facility], [place] [address], [place], [place] [address]  SARS CORONAVIRUS 2 (TAT 6-24 HRS) Nasopharyngeal Nasopharyngeal Swab     Status: None   Collection Time: 04/01/19  3:29 PM   Specimen: Nasopharyngeal Swab  Result Value Ref Range Status   SARS Coronavirus 2 NEGATIVE NEGATIVE Final    Comment: (NOTE) SARS-CoV-2 target nucleic acids are NOT DETECTED. The SARS-CoV-2 RNA is generally detectable in upper and lower respiratory specimens during the acute phase of  infection. Negative results do not preclude SARS-CoV-2 infection, do not rule out co-infections with other pathogens, and should not be used as the sole basis for treatment or other patient management decisions. Negative results must be combined with clinical observations, patient history, and epidemiological information. The expected result is Negative. Fact Sheet for Patients: SugarRoll.be Fact Sheet for Healthcare Providers: https://www.woods-mathews.com/ This test is not yet approved or cleared by the [place] FDA and  has been authorized for detection and/or diagnosis of SARS-CoV-2 by FDA under an Emergency Use Authorization (EUA). This EUA will remain  in effect (meaning this test can be used) for the duration of the COVID-19 declaration under Section 56 4(b)(1) of the Act, 21 U.S.C. section 360bbb-3(b)(1), unless the authorization is terminated or revoked sooner. Performed at [facility], [place] [address], [place], [place] [address]      Time coordinating discharge: Over 30 minutes  SIGNED:   Eric J [place] DO  [facility] 04/03/2019, 11:09 AM

## 2019-04-03 NOTE — Progress Notes (Addendum)
Patient ID: [name], female   DOB: Apr 18, 1946, 73 y.o.   MRN: [id number]    Progress Note   Subjective  Day #2 CC: severe odynophagia  Patient says she is feeling better today, more comfortable and able to tolerate liquids and supplements.  She feels that she can manage at home with her current regimen.  WBC 4.5, hemoglobin 13.6, platelets 109   Objective   Vital signs in last 24 hours: Temp:  [98.1 F (36.7 C)] 98.1 F (36.7 C) (11/25 0558) Pulse Rate:  [54-57] 54 (11/25 0558) Resp:  [16-18] 16 (11/25 0558) BP: (95-115)/(51-68) 115/68 (11/25 0558) SpO2:  [97 %-99 %] 97 % (11/25 0558) Weight:  [66.9 kg] 66.9 kg (11/25 0500) Last BM Date: 04/03/19 General:    Older white female in NAD pleasant, and smiling today Heart:  Regular rate and rhythm; no murmurs Lungs: Respirations even and unlabored, lungs CTA bilaterally Abdomen:  Soft, nontender and nondistended. Normal bowel sounds. Extremities:  Without edema. Neurologic:  Alert and oriented,  grossly normal neurologically. Psych:  Cooperative. Normal mood and affect.  Intake/Output from previous day: 11/24 0701 - 11/25 0700 In: 2099.9 [P.O.:1200; I.V.:899.9] Out: 2250 [Urine:2250] Intake/Output this shift: No intake/output data recorded.  Lab Results: Recent Labs    04/01/19 1319 04/02/19 0405 04/03/19 0359  WBC 8.3 4.9 4.5  HGB 15.2* 13.1 13.6  HCT 47.4* 41.8 41.9  PLT 149* 105* 109*   BMET Recent Labs    04/01/19 1319 04/02/19 0405 04/03/19 0359  NA 135 135 135  K 4.8 3.8 3.8  CL 98 105 107  CO2 26 20* 21*  GLUCOSE 160* 152* 83  BUN 30* 24* 15  CREATININE 1.20* 0.93 0.77  CALCIUM 10.1 8.7* 8.8*   LFT Recent Labs    04/02/19 0405  PROT 6.0*  ALBUMIN 2.6*  AST 36  ALT 33  ALKPHOS 80  BILITOT 1.1   PT/INR Recent Labs    04/02/19 0405  LABPROT 15.1  INR 1.2    Studies/Results: Xr Chest Single View  Result Date: 04/01/2019 CLINICAL DATA:  Epigastric pain. Additional history  provided: Patient reports being unable to swallow even small amounts. EXAM: PORTABLE CHEST 1 VIEW COMPARISON:  Fluoroscopic esophagram 03/18/2019, prior chest radiograph 03/29/2016 FINDINGS: Sequela prior median sternotomy/CABG. Heart size within normal limits. Aortic atherosclerosis No airspace consolidation No evidence of pleural effusion or pneumothorax. No acute bony abnormality. Overlying cardiac monitoring leads. IMPRESSION: No airspace consolidation Electronically Signed   By: Kellie Simmering DO   On: 04/01/2019 14:07     Assessment / Plan:    #67 73 year old white female with severe dysphagia and odynophagia status post EGD with banding of esophageal varices x 14 on March 28, 2019.  Patient has likely developed post banding ulcers, she has not had any evidence of GI bleeding. Pain and odynophagia likely from esophageal banding.  She has improved since admission, and is able to keep herself hydrated on liquids and also taking supplements. Pain has been controlled with viscous lidocaine, and hydrocodone elixir.  #2 decompensated [name] cirrhosis  #3 mild acute kidney injury secondary to poor oral intake and diuretics-resolved  Plan:  Patient is stable for discharge to home today from GI perspective. Advised full liquids and gradually advance as she tolerates to soft diet. Ensure or boost 2-3 times daily between meals until taking solids.  Please discharge with viscous lidocaine 20 minutes AC meals until symptoms have resolved Please discharge with hydrocodone elixir every 4 to 6 hours  as needed for pain over the next 7 to 10 days.  Continue Protonix 40 mg p.o. once daily Carafate suspension 1 g between meals and at bedtime x 2 weeks.  We will arrange outpatient follow-up with Dr. [name], and she will need repeat EGD and possible repeat banding in several weeks.  GI signing off.    Principal Problem:   Dysphagia Active Problems:   Hyperlipemia   GERD (gastroesophageal reflux  disease)   Diabetes mellitus type 2, controlled (HCC)   Essential hypertension   CAD (coronary artery disease)   Cirrhosis of liver without ascites (HCC)   Esophageal varices without bleeding ([place])   Odynophagia    LOS: 1 day   Amy Esterwood PA-C 04/03/2019, 9:01 AM      Attending Physician Note   I have taken an interval history, reviewed the chart and examined the patient. I agree with the Advanced Practitioner's note, impression and recommendations.   Lucio Edward, MD [facility]

## 2019-04-03 NOTE — Telephone Encounter (Signed)
Transition Care Management Follow-up Telephone Call  Date of discharge and from where: 04/03/2019 [facility]  How have you been since you were released from the hospital? [place]  Any questions or concerns? No   Items Reviewed:  Did the pt receive and understand the discharge instructions provided? Yes   Medications obtained and verified? Yes   Any new allergies since your discharge? No   Dietary orders reviewed? Yes  Do you have support at home? Yes   Other (ie: DME, Home Health, etc) No Home Health patient stated she can take care of herself along with her husband.   Functional Questionnaire: (I = Independent and D = Dependent) ADL's: I  Bathing/Dressing- I   Meal Prep- I  Eating- I  Maintaining continence- I  Transferring/Ambulation- I  Managing Meds- I   Follow up appointments reviewed:    PCP Hospital f/u appt confirmed? Yes  Scheduled to see Dr. Mariea Clonts on 04/12/19 .  [facility] f/u appt confirmed? Yes   Are transportation arrangements needed? No   If their condition worsens, is the pt aware to call  their PCP or go to the ED? Yes  Was the patient provided with contact information for the PCP's office or ED? Yes  Was the pt encouraged to call back with questions or concerns? Yes

## 2019-04-03 NOTE — Progress Notes (Signed)
Discharge instructions given to pt and all questions were answered.  

## 2019-04-03 NOTE — Telephone Encounter (Signed)
Refilled med following dc from [employer]

## 2019-04-06 ENCOUNTER — Inpatient Hospital Stay (HOSPITAL_COMMUNITY)
Admission: EM | Admit: 2019-04-06 | Discharge: 2019-04-12 | DRG: 405 | Disposition: A | Discharge status: Home Health | Payer: Medicare Other | Attending: Internal Medicine | Admitting: Internal Medicine

## 2019-04-06 ENCOUNTER — Encounter (HOSPITAL_COMMUNITY): Payer: Self-pay | Admitting: Emergency Medicine

## 2019-04-06 ENCOUNTER — Emergency Department (HOSPITAL_COMMUNITY): Payer: Medicare Other

## 2019-04-06 ENCOUNTER — Other Ambulatory Visit: Payer: Self-pay

## 2019-04-06 DIAGNOSIS — E1165 Type 2 diabetes mellitus with hyperglycemia: Secondary | ICD-10-CM | POA: Diagnosis present

## 2019-04-06 DIAGNOSIS — K3189 Other diseases of stomach and duodenum: Secondary | ICD-10-CM | POA: Diagnosis not present

## 2019-04-06 DIAGNOSIS — E872 Acidosis: Secondary | ICD-10-CM | POA: Diagnosis not present

## 2019-04-06 DIAGNOSIS — R188 Other ascites: Secondary | ICD-10-CM | POA: Diagnosis not present

## 2019-04-06 DIAGNOSIS — I5032 Chronic diastolic (congestive) heart failure: Secondary | ICD-10-CM | POA: Diagnosis present

## 2019-04-06 DIAGNOSIS — Z794 Long term (current) use of insulin: Secondary | ICD-10-CM | POA: Diagnosis not present

## 2019-04-06 DIAGNOSIS — I81 Portal vein thrombosis: Secondary | ICD-10-CM | POA: Diagnosis present

## 2019-04-06 DIAGNOSIS — N179 Acute kidney failure, unspecified: Secondary | ICD-10-CM | POA: Diagnosis not present

## 2019-04-06 DIAGNOSIS — K2211 Ulcer of esophagus with bleeding: Secondary | ICD-10-CM | POA: Diagnosis present

## 2019-04-06 DIAGNOSIS — Z20828 Contact with and (suspected) exposure to other viral communicable diseases: Secondary | ICD-10-CM | POA: Diagnosis present

## 2019-04-06 DIAGNOSIS — K766 Portal hypertension: Secondary | ICD-10-CM | POA: Diagnosis present

## 2019-04-06 DIAGNOSIS — Z951 Presence of aortocoronary bypass graft: Secondary | ICD-10-CM | POA: Diagnosis not present

## 2019-04-06 DIAGNOSIS — R197 Diarrhea, unspecified: Secondary | ICD-10-CM | POA: Diagnosis not present

## 2019-04-06 DIAGNOSIS — E785 Hyperlipidemia, unspecified: Secondary | ICD-10-CM | POA: Diagnosis present

## 2019-04-06 DIAGNOSIS — Z801 Family history of malignant neoplasm of trachea, bronchus and lung: Secondary | ICD-10-CM

## 2019-04-06 DIAGNOSIS — R112 Nausea with vomiting, unspecified: Secondary | ICD-10-CM

## 2019-04-06 DIAGNOSIS — K7469 Other cirrhosis of liver: Secondary | ICD-10-CM | POA: Diagnosis not present

## 2019-04-06 DIAGNOSIS — K7581 Nonalcoholic steatohepatitis (NASH): Secondary | ICD-10-CM | POA: Diagnosis present

## 2019-04-06 DIAGNOSIS — Z8249 Family history of ischemic heart disease and other diseases of the circulatory system: Secondary | ICD-10-CM | POA: Diagnosis not present

## 2019-04-06 DIAGNOSIS — I130 Hypertensive heart and chronic kidney disease with heart failure and stage 1 through stage 4 chronic kidney disease, or unspecified chronic kidney disease: Secondary | ICD-10-CM | POA: Diagnosis present

## 2019-04-06 DIAGNOSIS — I8501 Esophageal varices with bleeding: Secondary | ICD-10-CM

## 2019-04-06 DIAGNOSIS — N181 Chronic kidney disease, stage 1: Secondary | ICD-10-CM | POA: Diagnosis present

## 2019-04-06 DIAGNOSIS — I2510 Atherosclerotic heart disease of native coronary artery without angina pectoris: Secondary | ICD-10-CM | POA: Diagnosis not present

## 2019-04-06 DIAGNOSIS — K7460 Unspecified cirrhosis of liver: Secondary | ICD-10-CM | POA: Diagnosis not present

## 2019-04-06 DIAGNOSIS — D735 Infarction of spleen: Secondary | ICD-10-CM | POA: Diagnosis present

## 2019-04-06 DIAGNOSIS — E119 Type 2 diabetes mellitus without complications: Secondary | ICD-10-CM

## 2019-04-06 DIAGNOSIS — Z8261 Family history of arthritis: Secondary | ICD-10-CM | POA: Diagnosis not present

## 2019-04-06 DIAGNOSIS — R1310 Dysphagia, unspecified: Secondary | ICD-10-CM

## 2019-04-06 DIAGNOSIS — D696 Thrombocytopenia, unspecified: Secondary | ICD-10-CM | POA: Diagnosis not present

## 2019-04-06 DIAGNOSIS — Z7982 Long term (current) use of aspirin: Secondary | ICD-10-CM

## 2019-04-06 DIAGNOSIS — Z803 Family history of malignant neoplasm of breast: Secondary | ICD-10-CM | POA: Diagnosis not present

## 2019-04-06 DIAGNOSIS — I10 Essential (primary) hypertension: Secondary | ICD-10-CM | POA: Diagnosis not present

## 2019-04-06 DIAGNOSIS — I25118 Atherosclerotic heart disease of native coronary artery with other forms of angina pectoris: Secondary | ICD-10-CM | POA: Diagnosis present

## 2019-04-06 DIAGNOSIS — I8511 Secondary esophageal varices with bleeding: Secondary | ICD-10-CM | POA: Diagnosis not present

## 2019-04-06 DIAGNOSIS — D62 Acute posthemorrhagic anemia: Secondary | ICD-10-CM | POA: Diagnosis not present

## 2019-04-06 DIAGNOSIS — Z800 Family history of malignant neoplasm of digestive organs: Secondary | ICD-10-CM

## 2019-04-06 DIAGNOSIS — K922 Gastrointestinal hemorrhage, unspecified: Secondary | ICD-10-CM

## 2019-04-06 DIAGNOSIS — K219 Gastro-esophageal reflux disease without esophagitis: Secondary | ICD-10-CM | POA: Diagnosis not present

## 2019-04-06 LAB — COMPREHENSIVE METABOLIC PANEL
ALT: 34 U/L (ref 0–44)
AST: 46 U/L — ABNORMAL HIGH (ref 15–41)
Albumin: 2.9 g/dL — ABNORMAL LOW (ref 3.5–5.0)
Alkaline Phosphatase: 95 U/L (ref 38–126)
Anion gap: 10 (ref 5–15)
BUN: 11 mg/dL (ref 8–23)
CO2: 23 mmol/L (ref 22–32)
Calcium: 9 mg/dL (ref 8.9–10.3)
Chloride: 100 mmol/L (ref 98–111)
Creatinine, Ser: 0.88 mg/dL (ref 0.44–1.00)
GFR calc Af Amer: >60 mL/min (ref >60)
GFR calc non Af Amer: >60 mL/min (ref >60)
Glucose, Bld: 211 mg/dL — ABNORMAL HIGH (ref 70–99)
Potassium: 4.2 mmol/L (ref 3.5–5.1)
Sodium: 133 mmol/L — ABNORMAL LOW (ref 135–145)
Total Bilirubin: 1.4 mg/dL — ABNORMAL HIGH (ref 0.3–1.2)
Total Protein: 7.3 g/dL (ref 6.5–8.1)

## 2019-04-06 LAB — CBC
HCT: 43.2 % (ref 36.0–46.0)
Hemoglobin: 14.1 g/dL (ref 12.0–15.0)
MCH: 30.7 pg (ref 26.0–34.0)
MCHC: 32.6 g/dL (ref 30.0–36.0)
MCV: 94.1 fL (ref 80.0–100.0)
Platelets: 117 10*3/uL — ABNORMAL LOW (ref 150–400)
RBC: 4.59 MIL/uL (ref 3.87–5.11)
RDW: 13.6 % (ref 11.5–15.5)
WBC: 8 10*3/uL (ref 4.0–10.5)
nRBC: 0 % (ref 0.0–0.2)

## 2019-04-06 LAB — POC SARS CORONAVIRUS 2 AG -  ED: SARS Coronavirus 2 Ag: NEGATIVE

## 2019-04-06 LAB — GLUCOSE, CAPILLARY: Glucose-Capillary: 177 mg/dL — ABNORMAL HIGH (ref 70–99)

## 2019-04-06 LAB — LIPASE, BLOOD: Lipase: 23 U/L (ref 11–51)

## 2019-04-06 MED ORDER — INSULIN ASPART 100 UNIT/ML ~~LOC~~ SOLN: 0.0000 [IU] | Freq: Every day | SUBCUTANEOUS | Status: DC

## 2019-04-06 MED ORDER — PANTOPRAZOLE SODIUM 40 MG PO TBEC
40.0000 mg | DELAYED_RELEASE_TABLET | Freq: Every day | ORAL | Status: DC
  Administered 2019-04-06: 22:00:00 40 mg via ORAL
  Filled 2019-04-06: qty 1

## 2019-04-06 MED ORDER — ONDANSETRON HCL 4 MG/2ML IJ SOLN
4.0000 mg | Freq: Four times a day (QID) | INTRAMUSCULAR | Status: DC | PRN
  Administered 2019-04-08: 4 mg via INTRAVENOUS
  Filled 2019-04-06: qty 2

## 2019-04-06 MED ORDER — INSULIN ASPART 100 UNIT/ML ~~LOC~~ SOLN
0.0000 [IU] | Freq: Three times a day (TID) | SUBCUTANEOUS | Status: DC
  Administered 2019-04-07 (×2): 3 [IU] via SUBCUTANEOUS

## 2019-04-06 MED ORDER — NADOLOL 40 MG PO TABS
40.0000 mg | ORAL_TABLET | Freq: Every day | ORAL | Status: DC
  Filled 2019-04-06: qty 1

## 2019-04-06 MED ORDER — ACETAMINOPHEN 325 MG PO TABS
650.0000 mg | ORAL_TABLET | Freq: Four times a day (QID) | ORAL | Status: DC | PRN
  Administered 2019-04-07 – 2019-04-11 (×3): 650 mg via ORAL
  Filled 2019-04-06 (×3): qty 2

## 2019-04-06 MED ORDER — SODIUM CHLORIDE 0.9 % IV BOLUS
500.0000 mL | Freq: Once | INTRAVENOUS | Status: AC
  Administered 2019-04-06: 500 mL via INTRAVENOUS

## 2019-04-06 MED ORDER — FENTANYL CITRATE (PF) 100 MCG/2ML IJ SOLN
12.5000 ug | Freq: Once | INTRAMUSCULAR | Status: AC
  Administered 2019-04-06: 15:00:00 12.5 ug via INTRAVENOUS
  Filled 2019-04-06: qty 2

## 2019-04-06 MED ORDER — SODIUM CHLORIDE 0.9 % IV BOLUS
500.0000 mL | Freq: Once | INTRAVENOUS | Status: AC
  Administered 2019-04-06: 15:00:00 500 mL via INTRAVENOUS

## 2019-04-06 MED ORDER — ACETAMINOPHEN 650 MG RE SUPP: 650.0000 mg | Freq: Four times a day (QID) | RECTAL | Status: DC | PRN

## 2019-04-06 MED ORDER — LIDOCAINE VISCOUS HCL 2 % MT SOLN
15.0000 mL | Freq: Once | OROMUCOSAL | Status: AC
  Administered 2019-04-06: 15 mL via OROMUCOSAL
  Filled 2019-04-06: qty 15

## 2019-04-06 MED ORDER — IOHEXOL 300 MG/ML  SOLN
100.0000 mL | Freq: Once | INTRAMUSCULAR | Status: AC | PRN
  Administered 2019-04-06: 15:00:00 100 mL via INTRAVENOUS

## 2019-04-06 MED ORDER — ONDANSETRON HCL 4 MG/2ML IJ SOLN
4.0000 mg | Freq: Once | INTRAMUSCULAR | Status: AC
  Administered 2019-04-06: 4 mg via INTRAVENOUS
  Filled 2019-04-06: qty 2

## 2019-04-06 MED ORDER — FENTANYL CITRATE (PF) 100 MCG/2ML IJ SOLN
12.5000 ug | Freq: Four times a day (QID) | INTRAMUSCULAR | Status: DC | PRN
  Administered 2019-04-06 – 2019-04-07 (×3): 12.5 ug via INTRAVENOUS
  Administered 2019-04-08: 50 ug via INTRAVENOUS
  Administered 2019-04-08: 12.5 ug via INTRAVENOUS
  Administered 2019-04-08: 50 ug via INTRAVENOUS
  Administered 2019-04-08: 15:00:00 100 ug via INTRAVENOUS
  Administered 2019-04-08: 12.5 ug via INTRAVENOUS
  Filled 2019-04-06 (×7): qty 2

## 2019-04-06 MED ORDER — ONDANSETRON HCL 4 MG PO TABS: 4.0000 mg | ORAL_TABLET | Freq: Four times a day (QID) | ORAL | Status: DC | PRN

## 2019-04-06 MED ORDER — SODIUM CHLORIDE 0.9% FLUSH
3.0000 mL | Freq: Once | INTRAVENOUS | Status: AC
  Administered 2019-04-06: 3 mL via INTRAVENOUS

## 2019-04-06 MED ORDER — SUCRALFATE 1 GM/10ML PO SUSP
1.0000 g | Freq: Three times a day (TID) | ORAL | Status: DC
  Administered 2019-04-06 – 2019-04-12 (×16): 1 g via ORAL
  Filled 2019-04-06 (×26): qty 10

## 2019-04-06 NOTE — ED Provider Notes (Signed)
  Face-to-face evaluation   History: She presents for evaluation of vomiting which started yesterday and right upper quadrant abdominal pain.  She denies ongoing chronic abdominal pain.  She states she had a banding procedure for esophageal varices, done 9 days ago, as treatment for "difficulty swallowing."  She has had 2 episodes of diarrhea today.  She denies dizziness or weakness.  No hematemesis.  No fever or cough.  Physical exam: Elderly appearing, alert and cooperative.  Abdomen soft and nontender.  No respiratory distress.  She is lucid.   Medical screening examination/treatment/procedure(s) were conducted as a shared visit with non-physician practitioner(s) and myself.  I personally evaluated the patient during the encounter     Daleen Bo, MD 04/09/19 2328

## 2019-04-06 NOTE — Consult Note (Signed)
CROSS COVER [facility] Reason for Consult: Pain on swallowing; PVT. Referring Physician: [facility]P  HPI: [name] is a 73 year old white female with NASH cirrhosis and multiple medial problems listed below, who had EGD with banding done on 03/28/2019 when 14 bands were placed. Ever since she has had the procedure she has not been able to eat or drink without experiencing severe retrosternal pain. She was hospitalized briefly for the same problem, from 04/01/2019 to 04/03/2019 and was sent home on a clear liquid diet and Viscous Lidocaine, Protonix and Carafate. She presented to the ER with abdominal pain, nausea and vomiting, anorexia and diarrhea.    Past Medical History:  Diagnosis Date  . Allergy   . Arthritis    neck  . Cataract    bilateral - MD monitoring cataracts  . CHF (congestive heart failure) ([place])   . Chronic kidney disease, stage I    DR OTTELIN  HX UTIS  . Cirrhosis ([place])   . Cramp of limb   . Diabetes mellitus   . Dysphagia, unspecified(787.20)   . Dysuria   . Epistaxis   . GERD (gastroesophageal reflux disease)   . Heart murmur    NO CARDIOLOGIST  DX FOR YEARS ASYMPTOMATIC  . Lumbago   . Neoplasm of uncertain behavior of skin   . Nonspecific elevation of levels of transaminase or lactic acid dehydrogenase (LDH)   . Osteoarthrosis, unspecified whether generalized or localized, unspecified site   . Other and unspecified hyperlipidemia    diet controlled  . Pain in joint, shoulder region   . Paresthesias 04/01/2015  . Postablative ovarian failure   . Trochanteric bursitis of left hip 12/15/2015  . Type 2 diabetes mellitus without complication ([place])   . Unspecified essential hypertension    no meds   Past Surgical History:  Procedure Laterality Date  . BREAST BIOPSY    . CARDIAC CATHETERIZATION N/A 01/27/2016   Procedure: Left Heart Cath and Coronary Angiography;  Surgeon: Belva Crome, MD;  Location: [facility];  Service: Cardiovascular;  Laterality: N/A;   . COLONOSCOPY  2012   [facility] - polyp  . COLONOSCOPY WITH PROPOFOL N/A 07/07/2016   Procedure: COLONOSCOPY WITH PROPOFOL;  Surgeon: [name], MD;  Location: [facility];  Service: Endoscopy;  Laterality: N/A;  . CORONARY ARTERY BYPASS GRAFT N/A 01/28/2016   Procedure: CORONARY ARTERY BYPASS GRAFTING (CABG) x 3 USING RIGHT LEG GREATER SAPHENOUS VEIN GRAFT;  Surgeon: Melrose Nakayama, MD;  Location: [facility];  Service: Open Heart Surgery;  Laterality: N/A;  . ENDOVEIN HARVEST OF GREATER SAPHENOUS VEIN Right 01/28/2016   Procedure: ENDOVEIN HARVEST OF GREATER SAPHENOUS VEIN;  Surgeon: Melrose Nakayama, MD;  Location: [facility];  Service: Open Heart Surgery;  Laterality: Right;  . ESOPHAGEAL BANDING  03/28/2019   Procedure: ESOPHAGEAL BANDING;  Surgeon: [name], MD;  Location: [facility];  Service: Endoscopy;;  . ESOPHAGOGASTRODUODENOSCOPY (EGD) WITH PROPOFOL N/A 07/07/2016   Procedure: ESOPHAGOGASTRODUODENOSCOPY (EGD) WITH PROPOFOL;  Surgeon: [name], MD;  Location[facility];  Service: Endoscopy;  Laterality: N/A;  . ESOPHAGOGASTRODUODENOSCOPY (EGD) WITH PROPOFOL N/A 03/28/2019   Procedure: ESOPHAGOGASTRODUODENOSCOPY (EGD) WITH PROPOFOL;  Surgeon: [name], MD;  Location: [facility];  Service: Endoscopy;  Laterality: N/A;  . MAXIMUM ACCESS (MAS)POSTERIOR LUMBAR INTERBODY FUSION (PLIF) 1 LEVEL Left 06/10/2015   Procedure: FOR MAXIMUM ACCESS (MAS) POSTERIOR LUMBAR INTERBODY FUSION (PLIF) LUMBAR THREE-FOUR EXTRAFORAMINAL MICRODISCECTOMY LUMBAR FIVE-SACRAL ONE LEFT;  Surgeon: Eustace Moore, MD;  Location: [facility];  Service: Neurosurgery;  Laterality: Left;  . TEE WITHOUT CARDIOVERSION N/A 01/28/2016   Procedure: TRANSESOPHAGEAL ECHOCARDIOGRAM (TEE);  Surgeon: Melrose Nakayama, MD;  Location: [facility];  Service: Open Heart Surgery;  Laterality: N/A;  . TUBAL LIGATION  1982   Dr Connye Burkitt  . UPPER GASTROINTESTINAL ENDOSCOPY    . VAGINAL HYSTERECTOMY  1997   Dr Rande Lawman    Family History  Problem Relation Age of Onset  . Lung cancer Father   . Arthritis Sister   . Arthritis Brother   . Heart disease Maternal Grandmother   . Heart disease Maternal Grandfather   . Heart disease Paternal Grandmother   . Heart disease Paternal Grandfather   . Breast cancer Mother   . Liver cancer Brother   . Breast cancer Maternal Aunt   . Breast cancer Paternal Aunt   . Colon cancer Neg Hx   . Esophageal cancer Neg Hx   . Rectal cancer Neg Hx   . Stomach cancer Neg Hx    Social History:  reports that she has never smoked. She has never used smokeless tobacco. She reports that she does not drink alcohol or use drugs.  Allergies:  Allergies  Allergen Reactions  . Kiwi Extract Anaphylaxis  . Tdap [Tetanus-Diphth-Acell Pertussis] Swelling and Other (See Comments)    Swelling at injection site, gets very hot  . Statins     RHABDOMYOLYSIS  . Latex Itching, Dermatitis and Rash  . Tramadol Nausea And Vomiting   Medications: I have reviewed the patient's current medications.  Results for orders placed or performed during the hospital encounter of 04/06/19 (from the past 48 hour(s))  Lipase, blood     Status: None   Collection Time: 04/06/19  1:00 PM  Result Value Ref Range   Lipase 23 11 - 51 U/L    Comment: Performed at [facility], [place] [address], [place], [place] [address]  Comprehensive metabolic panel     Status: Abnormal   Collection Time: 04/06/19  1:00 PM  Result Value Ref Range   Sodium 133 (L) 135 - 145 mmol/L   Potassium 4.2 3.5 - 5.1 mmol/L   Chloride 100 98 - 111 mmol/L   CO2 23 22 - 32 mmol/L   Glucose, Bld 211 (H) 70 - 99 mg/dL   BUN 11 8 - 23 mg/dL   Creatinine, Ser 0.88 0.44 - 1.00 mg/dL   Calcium 9.0 8.9 - 10.3 mg/dL   Total Protein 7.3 6.5 - 8.1 g/dL   Albumin 2.9 (L) 3.5 - 5.0 g/dL   AST 46 (H) 15 - 41 U/L   ALT 34 0 - 44 U/L   Alkaline Phosphatase 95 38 - 126 U/L   Total Bilirubin 1.4 (H) 0.3 - 1.2 mg/dL   GFR calc non Af Amer  >60 >60 mL/min   GFR calc Af Amer >60 >60 mL/min   Anion gap 10 5 - 15    Comment: Performed at [facility], [place] [address] [place] [address]  CBC     Status: Abnormal   Collection Time: 04/06/19  1:00 PM  Result Value Ref Range   WBC 8.0 4.0 - 10.5 K/uL   RBC 4.59 3.87 - 5.11 MIL/uL   Hemoglobin 14.1 12.0 - 15.0 g/dL   HCT 43.2 36.0 - 46.0 %   MCV 94.1 80.0 - 100.0 fL   MCH 30.7 26.0 - 34.0 pg   MCHC 32.6 30.0 - 36.0 g/dL  RDW 13.6 11.5 - 15.5 %   Platelets 117 (L) 150 - 400 K/uL    Comment: REPEATED TO VERIFY PLATELET COUNT CONFIRMED BY SMEAR SPECIMEN CHECKED FOR CLOTS Immature Platelet Fraction may be clinically indicated, consider ordering this additional test [id number]    nRBC 0.0 0.0 - 0.2 %    Comment: Performed at [facility] [address], [place], [place] [address]    Ct Abdomen Pelvis W Contrast  Result Date: 04/06/2019 CLINICAL DATA:  Patient with vomiting, diarrhea and right upper quadrant pain. Status post esophageal band. EXAM: CT ABDOMEN AND PELVIS WITH CONTRAST TECHNIQUE: Multidetector CT imaging of the abdomen and pelvis was performed using the standard protocol following bolus administration of intravenous contrast. CONTRAST:  136m OMNIPAQUE IOHEXOL 300 MG/ML  [facility] COMPARISON:  MRI abdomen 04/07/2017 FINDINGS: Lower chest: Normal heart size. Dependent atelectasis within the bilateral lower lobes. No pleural effusion. Hepatobiliary: Morphologic changes to the liver compatible with cirrhosis. Heterogeneous attenuation of the liver. Mild gallbladder wall thickening, likely secondary to cirrhosis. No definite intrahepatic or extrahepatic biliary ductal dilatation. The portal vein is attenuated centrally (image 22; series 3) most compatible thrombus. Pancreas: Unremarkable Spleen: Enlarged and heterogeneous in attenuation. And like low-attenuation centrally. Splenic infarct not entirely excluded. Adrenals/Urinary Tract: Normal adrenal glands.  Kidneys enhance symmetrically with contrast. No hydronephrosis. Urinary bladder is unremarkable. Stomach/Bowel: There is circumferential wall thickening of the cecum and ascending colon. No evidence for small bowel obstruction. Normal morphology of the stomach. Small amount of fluid within the small bowel mesentery. Paraesophageal varices. Vascular/Lymphatic: Normal caliber abdominal aorta. Peripheral calcified atherosclerotic plaque. No retroperitoneal lymphadenopathy. Reproductive: Status post hysterectomy. Other: Moderate volume ascites throughout the abdomen. Musculoskeletal: Lumbar spine degenerative changes. Lumbar spinal fusion hardware. No aggressive or acute appearing osseous lesions. IMPRESSION: 1. The portal vein is attenuated centrally most compatible with portal venous thrombus which appears to have progressed from prior exam. 2. There is circumferential wall thickening of the cecum and ascending colon which may be secondary to portal venous hypertension or colitis. 3. Morphologic changes to the liver compatible with cirrhosis. Heterogeneous attenuation of the liver which may be secondary to cirrhosis. 4. Bandlike heterogeneity of the spleen raising the possibility of splenic infarct. 5. Moderate volume ascites throughout the abdomen and pelvis. 6. Paraesophageal varices. Electronically Signed   By: DLovey NewcomerM.D.   On: 04/06/2019 15:56   Review of Systems  Constitutional: Negative.   HENT: Negative.   Eyes: Negative.   Respiratory: Negative.   Cardiovascular: Negative.   Gastrointestinal: Positive for abdominal pain, diarrhea, heartburn, nausea and vomiting. Negative for blood in stool, constipation and melena.  Musculoskeletal: Positive for joint pain.  Skin: Negative.   Neurological: Negative.   Endo/Heme/Allergies: Negative.    Blood pressure (!) 107/48, pulse 78, temperature 98.2 F (36.8 C), temperature source Oral, resp. rate 18, SpO2 99 %. Physical Exam  Constitutional: She is  oriented to person, place, and time. Vital signs are normal. She appears well-developed and well-nourished. She is cooperative.  HENT:  Head: Normocephalic and atraumatic.  Eyes: Pupils are equal, round, and reactive to light. Conjunctivae and EOM are normal.  Neck: Normal range of motion.  Cardiovascular: Normal rate and regular rhythm.  Midline scar in the chest from previous CABG  Respiratory: Effort normal and breath sounds normal.  GI: Soft. Bowel sounds are normal. There is abdominal tenderness in the epigastric area.  Neurological: She is alert and oriented to person, place, and time.  Skin: Skin is  warm and dry.  Psychiatric: She has a normal mood and affect. Her behavior is normal. Judgment and thought content normal.   Assessment/Plan: 1) Dysphagia and odynophagia s/p banding of varices on 03/28/2019; I suspecyt she has severe esophageal ulcerations s/p banding. May need an EGD for further evaluation.  2) Abdominal pain with an abnormal CT scan-portal vein thrombosis-thought to be chronic-1st seen in 2018 along with some a splenic infarct and ?portal colopathy-thickening of the cecum-as per my discussion with Dr. Rosana Hoes, the SMA appears patent. She is not a candidate for anticoagulation. 3) CAD s/p CABG x 3. ) AODM/Hyperlipidemia   Juanita Craver 04/06/2019, 5:32 PM

## 2019-04-06 NOTE — ED Notes (Signed)
Pt aware urine sample is needed

## 2019-04-06 NOTE — ED Triage Notes (Signed)
Pt here for eval of emesis, diarrhea and RUQ pain for several days post esophageal band. Was alread re evaluated after the banding for trouble swallowing but is not having that problem anymore. Pt denies chest pain. Coughing at triage but states this is her typical "get rid of phlegm" cough.

## 2019-04-06 NOTE — ED Notes (Signed)
Patient transported to CT 

## 2019-04-06 NOTE — H&P (Signed)
History and Physical    [name] KKX:[id number] DOB: October 20, 1945 DOA: 04/06/2019  PCP: Gayland Curry, DO  Patient coming from: Home I have personally briefly reviewed patient's old medical records in [place]  Chief Complaint: Generalized abdominal pain, vomiting, diarrhea, decreased appetite  HPI: [name] is a 73 y.o. female with medical history significant of NAFLD with cirrhosis, esophageal varices status post band ligation on 11/19, type 2 diabetes mellitus, hypertension, hyperlipidemia, chronic thrombocytopenia presents to emergency department due to  abdominal pain-mostly right upper quadrant and epigastric, vomiting, diarrhea, decreased appetite since she discharged from the hospital on 04/03/2019.  Patient hospitalized from 04/01/2019 to 04/03/2019 due to intractable vomiting-no intervention done at that time. She discharged home on Viscous Lidocaine, hydrocodone, Protonix and Carafate. patient tells me that since her discharge from the hospital she continues to have abdominal pain, vomiting-nonbloody, nonbilious to many episodes to count, could not keep anything down, decreased appetite.  Denies hematemesis, melena, fever, chills, abdominal distention, confusion, chest pain, shortness of breath, leg swelling, cough, congestion.  She lives with her husband and denies smoking, alcohol, street drug use, over-the-counter use of NSAIDs, weight loss or night sweats.  ED Course: Upon arrival: BP soft, CBC shows chronic thrombocytopenia, CMP shows AST of 46, total bilirubin of 1.4, lipase: WNL, COVID-19 and UA pending.  CT abdomen/pelvis shows portal vein thrombosis, splenic infarct, cirrhosis with ascites and paraesophageal varices.  Patient received IV fluids, Zofran and fentanyl while she was in the ED.  EDP consulted GI for further evaluation and management.  Review of Systems: As per HPI otherwise negative.    Past Medical History:  Diagnosis Date   Allergy     Arthritis    neck   Cataract    bilateral - MD monitoring cataracts   CHF (congestive heart failure) (HCC)    Chronic kidney disease, stage I    DR OTTELIN  HX UTIS   Cirrhosis (HCC)    Cramp of limb    Diabetes mellitus    Dysphagia, unspecified(787.20)    Dysuria    Epistaxis    GERD (gastroesophageal reflux disease)    Heart murmur    NO CARDIOLOGIST  DX FOR YEARS ASYMPTOMATIC   Lumbago    Neoplasm of uncertain behavior of skin    Nonspecific elevation of levels of transaminase or lactic acid dehydrogenase (LDH)    Osteoarthrosis, unspecified whether generalized or localized, unspecified site    Other and unspecified hyperlipidemia    diet controlled   Pain in joint, shoulder region    Paresthesias 04/01/2015   Postablative ovarian failure    Trochanteric bursitis of left hip 12/15/2015   Type 2 diabetes mellitus without complication ([place])    Unspecified essential hypertension    no meds    Past Surgical History:  Procedure Laterality Date   BREAST BIOPSY     CARDIAC CATHETERIZATION N/A 01/27/2016   Procedure: Left Heart Cath and Coronary Angiography;  Surgeon: Belva Crome, MD;  Location: [facility];  Service: Cardiovascular;  Laterality: N/A;   COLONOSCOPY  2012   [facility] - polyp   COLONOSCOPY WITH PROPOFOL N/A 07/07/2016   Procedure: COLONOSCOPY WITH PROPOFOL;  Surgeon: [name], MD;  Location: [facility];  Service: Endoscopy;  Laterality: N/A;   CORONARY ARTERY BYPASS GRAFT N/A 01/28/2016   Procedure: CORONARY ARTERY BYPASS GRAFTING (CABG) x 3 USING RIGHT LEG GREATER SAPHENOUS VEIN GRAFT;  Surgeon: Melrose Nakayama, MD;  Location: [facility];  Service: Open Heart Surgery;  Laterality: N/A;   ENDOVEIN HARVEST OF GREATER SAPHENOUS VEIN Right 01/28/2016   Procedure: ENDOVEIN HARVEST OF GREATER SAPHENOUS VEIN;  Surgeon: Melrose Nakayama, MD;  Location: [facility];  Service: Open Heart Surgery;  Laterality: Right;   ESOPHAGEAL  BANDING  03/28/2019   Procedure: ESOPHAGEAL BANDING;  Surgeon: [name], MD;  Location: [facility];  Service: Endoscopy;;   ESOPHAGOGASTRODUODENOSCOPY (EGD) WITH PROPOFOL N/A 07/07/2016   Procedure: ESOPHAGOGASTRODUODENOSCOPY (EGD) WITH PROPOFOL;  Surgeon: [name], MD;  Location: [facility];  Service: Endoscopy;  Laterality: N/A;   ESOPHAGOGASTRODUODENOSCOPY (EGD) WITH PROPOFOL N/A 03/28/2019   Procedure: ESOPHAGOGASTRODUODENOSCOPY (EGD) WITH PROPOFOL;  Surgeon: [name], MD;  Location: [facility];  Service: Endoscopy;  Laterality: N/A;   MAXIMUM ACCESS (MAS)POSTERIOR LUMBAR INTERBODY FUSION (PLIF) 1 LEVEL Left 06/10/2015   Procedure: FOR MAXIMUM ACCESS (MAS) POSTERIOR LUMBAR INTERBODY FUSION (PLIF) LUMBAR THREE-FOUR EXTRAFORAMINAL MICRODISCECTOMY LUMBAR FIVE-SACRAL ONE LEFT;  Surgeon: Eustace Moore, MD;  Location: [facility];  Service: Neurosurgery;  Laterality: Left;   TEE WITHOUT CARDIOVERSION N/A 01/28/2016   Procedure: TRANSESOPHAGEAL ECHOCARDIOGRAM (TEE);  Surgeon: Melrose Nakayama, MD;  Location: [facility];  Service: Open Heart Surgery;  Laterality: N/A;   TUBAL LIGATION  1982   Dr Connye Burkitt   UPPER GASTROINTESTINAL ENDOSCOPY     VAGINAL HYSTERECTOMY  1997   Dr Rande Lawman     reports that she has never smoked. She has never used smokeless tobacco. She reports that she does not drink alcohol or use drugs.  Allergies  Allergen Reactions   Kiwi Extract Anaphylaxis   Tdap [Tetanus-Diphth-Acell Pertussis] Swelling and Other (See Comments)    Swelling at injection site, gets very hot   Statins     RHABDOMYOLYSIS   Latex Itching, Dermatitis and Rash   Tramadol Nausea And Vomiting    Family History  Problem Relation Age of Onset   Lung cancer Father    Arthritis Sister    Arthritis Brother    Heart disease Maternal Grandmother    Heart disease Maternal Grandfather    Heart disease Paternal Grandmother    Heart disease Paternal Grandfather      Breast cancer Mother    Liver cancer Brother    Breast cancer Maternal Aunt    Breast cancer Paternal Aunt    Colon cancer Neg Hx    Esophageal cancer Neg Hx    Rectal cancer Neg Hx    Stomach cancer Neg Hx     Prior to Admission medications   Medication Sig Start Date End Date Taking? Authorizing Provider  acetaminophen (TYLENOL) 500 MG tablet Take 500 mg by mouth at bedtime.    Yes [provider]  Aromatic Inhalants (VICKS VAPOR IN) Vicks Vapor Rub apply small amount to outside of nose to help breathing   Yes [provider]  aspirin EC 81 MG tablet Take 1 tablet (81 mg total) by mouth daily. 01/16/17  Yes Josue Hector, MD  Biotin 10000 MCG TABS Take 10,000 mcg by mouth every morning.   Yes [provider]  calcium carbonate (OS-CAL) 600 MG TABS Take 600 mg by mouth 2 (two) times daily with a meal.     Yes [provider]  Cholecalciferol (VITAMIN D) 50 MCG (2000 UT) CAPS Take 2,000 Units by mouth daily.    Yes [provider]  Cyanocobalamin (VITAMIN B 12 PO) Take 1,000 mcg by mouth daily.     Yes [provider]  empagliflozin (JARDIANCE) 25 MG TABS tablet Take 25 mg by mouth daily. 01/31/19  Yes Reed, Tiffany L, DO  ezetimibe (ZETIA) 10 MG tablet TAKE 1 TABLET(10 MG) BY MOUTH DAILY Patient taking differently: Take 10 mg by mouth daily.  12/26/18  Yes Reed, Tiffany L, DO  furosemide (LASIX) 40 MG tablet TAKE 1 TABLET(40 MG) BY MOUTH DAILY Patient taking differently: Take 40 mg by mouth daily.  07/23/18  Yes Josue Hector, MD  insulin detemir (LEVEMIR) 100 UNIT/ML injection Inject 0.25 mLs (25 Units total) into the skin at bedtime. Decrease dose from 48 units to 25 units until diet further advanced.  Then may resume home dose. 04/03/19  Yes [place] Eric J, DO  lidocaine (XYLOCAINE) 2 % solution Use as directed 15 mLs in the mouth or throat 3 (three) times daily before meals for 14 days. 04/03/19 04/17/19 Yes [place] Eric  J, DO  loratadine (CLARITIN) 10 MG tablet Take 10 mg by mouth daily as needed for allergies.   Yes [provider]  MAGNESIUM PO Take 500 mg by mouth 2 (two) times daily in the am and at bedtime..   Yes [provider]  Multiple Vitamins-Minerals (MULTIVITAMIN WITH MINERALS) tablet Take 1 tablet by mouth daily.     Yes [provider]  nadolol (CORGARD) 40 MG tablet Take 1 tablet (40 mg total) by mouth daily. 02/28/19  Yes Reed, Tiffany L, DO  NOVOLOG FLEXPEN 100 UNIT/ML FlexPen Inject 12 units under the skin every morning, 8 units at lunch and 12 units at supper Patient taking differently: Inject 8-12 Units into the skin See admin instructions. Inject 12 units under the skin every morning, 8 units at lunch and 12 units at supper 02/19/19  Yes Reed, Tiffany L, DO  pantoprazole (PROTONIX) 40 MG tablet Take 1 tablet (40 mg total) by mouth daily. 04/03/19 06/02/19 Yes [place] Eric J, DO  Polyethyl Glycol-Propyl Glycol (SYSTANE OP) Place 1 drop into both eyes 2 (two) times daily.   Yes [provider]  potassium chloride SA (KLOR-CON) 20 MEQ tablet Take 1 tablet (20 mEq total) by mouth daily. 03/11/19  Yes Reed, Tiffany L, DO  Probiotic Product (PROBIOTIC DAILY PO) Take 1 capsule by mouth daily. Digestive Advantage Probiotic   Yes [provider]  spironolactone (ALDACTONE) 100 MG tablet TAKE 1 TABLET(100 MG) BY MOUTH TWICE DAILY Patient taking differently: Take 100 mg by mouth 2 (two) times daily.  09/10/18  Yes [name], MD  sucralfate (CARAFATE) 1 GM/10ML suspension Take 10 mLs (1 g total) by mouth 4 (four) times daily -  with meals and at bedtime for 14 days. 04/03/19 04/17/19 Yes [place] Donnamarie Poag, DO  BD PEN NEEDLE NANO U/F 32G X 4 MM MISC USE THREE TIMES DAILY AS DIRECTED 11/26/18   Reed, Tiffany L, DO  glucose blood test strip One Touch Ultra II strips. Use to test blood sugar three times daily. Dx: E11.65 06/01/17   Reed, Tiffany L, DO    HYDROcodone-acetaminophen (HYCET) 7.5-325 mg/15 ml solution Take 15 mLs by mouth every 6 (six) hours as needed for up to 10 days for moderate pain. Patient not taking: Reported on 04/06/2019 04/03/19 04/13/19  [place] Eric J, DO  Insulin Syringe-Needle U-100 (INSULIN SYRINGE 1CC/31GX5/16") 31G X 5/16" 1 ML MISC USE AS DIRECTED DAILY WITH LEVEMIR 07/27/18   Gayland Curry, DO    Physical Exam: Vitals:   04/06/19 1253 04/06/19 1345 04/06/19 1400  BP: 132/66 119/62 (!) 107/48  Pulse: 78    Resp: 16 16 18   Temp: 98.2 F (36.8 C)    TempSrc: Oral    SpO2: 99%      Constitutional: NAD, calm, comfortable Eyes: PERRL, lids and conjunctivae normal ENMT: Mucous membranes are moist. Posterior pharynx clear of any exudate or lesions.Normal dentition.  Neck: normal, supple, no masses, no thyromegaly Respiratory: clear to auscultation bilaterally, no wheezing, no crackles. Normal respiratory effort. No accessory muscle use.  Cardiovascular: Regular rate and rhythm, no murmurs / rubs / gallops. No extremity edema. 2+ pedal pulses. No carotid bruits.  Abdomen: no tenderness, no masses palpated. No hepatosplenomegaly. Bowel sounds positive.  Musculoskeletal: no clubbing / cyanosis. No joint deformity upper and lower extremities. Good ROM, no contractures. Normal muscle tone.  Skin: no rashes, lesions, ulcers. No induration Neurologic: CN 2-12 grossly intact. Sensation intact, DTR normal. Strength 5/5 in all 4.  Psychiatric: Normal judgment and insight. Alert and oriented x 3. Normal mood.    Labs on Admission: I have personally reviewed following labs and imaging studies  CBC: Recent Labs  Lab 04/01/19 1319 04/02/19 0405 04/03/19 0359 04/06/19 1300  WBC 8.3 4.9 4.5 8.0  NEUTROABS  --   --  2.4  --   HGB 15.2* 13.1 13.6 14.1  HCT 47.4* 41.8 41.9 43.2  MCV 95.4 98.8 96.3 94.1  PLT 149* 105* 109* 326*   Basic Metabolic Panel: Recent Labs  Lab 04/01/19 1319 04/02/19 0405 04/03/19 0359  04/06/19 1300  NA 135 135 135 133*  K 4.8 3.8 3.8 4.2  CL 98 105 107 100  CO2 26 20* 21* 23  GLUCOSE 160* 152* 83 211*  BUN 30* 24* 15 11  CREATININE 1.20* 0.93 0.77 0.88  CALCIUM 10.1 8.7* 8.8* 9.0   GFR: Estimated Creatinine Clearance: 53.1 mL/min (by C-G formula based on SCr of 0.88 mg/dL). Liver Function Tests: Recent Labs  Lab 04/01/19 1319 04/02/19 0405 04/06/19 1300  AST 55* 36 46*  ALT 44 33 34  ALKPHOS 109 80 95  BILITOT 1.0 1.1 1.4*  PROT 7.9 6.0* 7.3  ALBUMIN 3.3* 2.6* 2.9*   Recent Labs  Lab 04/01/19 1319 04/06/19 1300  LIPASE 20 23   No results for input(s): AMMONIA in the last 168 hours. Coagulation Profile: Recent Labs  Lab 04/02/19 0405  INR 1.2   Cardiac Enzymes: No results for input(s): CKTOTAL, CKMB, CKMBINDEX, TROPONINI in the last 168 hours. BNP (last 3 results) No results for input(s): PROBNP in the last 8760 hours. HbA1C: No results for input(s): HGBA1C in the last 72 hours. CBG: Recent Labs  Lab 04/02/19 1328 04/02/19 1647 04/02/19 2131 04/02/19 2159 04/03/19 0747  GLUCAP 244* 239* 118* 133* 97   Lipid Profile: No results for input(s): CHOL, HDL, LDLCALC, TRIG, CHOLHDL, LDLDIRECT in the last 72 hours. Thyroid Function Tests: No results for input(s): TSH, T4TOTAL, FREET4, T3FREE, THYROIDAB in the last 72 hours. Anemia Panel: No results for input(s): VITAMINB12, FOLATE, FERRITIN, TIBC, IRON, RETICCTPCT in the last 72 hours. Urine analysis:    Component Value Date/Time   COLORURINE YELLOW 06/19/2017 [place] 06/19/2017 1228   LABSPEC 1.007 06/19/2017 1228   PHURINE 7.0 06/19/2017 1228   GLUCOSEU NEGATIVE 06/19/2017 1228   HGBUR TRACE (A) 06/19/2017 [place] 01/27/2016 2016   BILIRUBINUR neg 05/05/2012 [place] 06/19/2017 1228   PROTEINUR NEGATIVE 06/19/2017 1228   UROBILINOGEN 0.2 05/05/2012 1445   UROBILINOGEN 0.2 12/18/2011 1409  NITRITE NEGATIVE 06/19/2017 1228    LEUKOCYTESUR 3+ (A) 06/19/2017 1228    Radiological Exams on Admission: Ct Abdomen Pelvis W Contrast  Result Date: 04/06/2019 CLINICAL DATA:  Patient with vomiting, diarrhea and right upper quadrant pain. Status post esophageal band. EXAM: CT ABDOMEN AND PELVIS WITH CONTRAST TECHNIQUE: Multidetector CT imaging of the abdomen and pelvis was performed using the standard protocol following bolus administration of intravenous contrast. CONTRAST:  142m OMNIPAQUE IOHEXOL 300 MG/ML  SOLN COMPARISON:  MRI abdomen 04/07/2017 FINDINGS: Lower chest: Normal heart size. Dependent atelectasis within the bilateral lower lobes. No pleural effusion. Hepatobiliary: Morphologic changes to the liver compatible with cirrhosis. Heterogeneous attenuation of the liver. Mild gallbladder wall thickening, likely secondary to cirrhosis. No definite intrahepatic or extrahepatic biliary ductal dilatation. The portal vein is attenuated centrally (image 22; series 3) most compatible thrombus. Pancreas: Unremarkable Spleen: Enlarged and heterogeneous in attenuation. And like low-attenuation centrally. Splenic infarct not entirely excluded. Adrenals/Urinary Tract: Normal adrenal glands. Kidneys enhance symmetrically with contrast. No hydronephrosis. Urinary bladder is unremarkable. Stomach/Bowel: There is circumferential wall thickening of the cecum and ascending colon. No evidence for small bowel obstruction. Normal morphology of the stomach. Small amount of fluid within the small bowel mesentery. Paraesophageal varices. Vascular/Lymphatic: Normal caliber abdominal aorta. Peripheral calcified atherosclerotic plaque. No retroperitoneal lymphadenopathy. Reproductive: Status post hysterectomy. Other: Moderate volume ascites throughout the abdomen. Musculoskeletal: Lumbar spine degenerative changes. Lumbar spinal fusion hardware. No aggressive or acute appearing osseous lesions. IMPRESSION: 1. The portal vein is attenuated centrally most  compatible with portal venous thrombus which appears to have progressed from prior exam. 2. There is circumferential wall thickening of the cecum and ascending colon which may be secondary to portal venous hypertension or colitis. 3. Morphologic changes to the liver compatible with cirrhosis. Heterogeneous attenuation of the liver which may be secondary to cirrhosis. 4. Bandlike heterogeneity of the spleen raising the possibility of splenic infarct. 5. Moderate volume ascites throughout the abdomen and pelvis. 6. Paraesophageal varices. Electronically Signed   By: DLovey NewcomerM.D.   On: 04/06/2019 15:56    EKG: Sinus rhythm, Ventricular premature complex, left axis deviation and borderline prolonged QT interval.  Assessment/Plan Principal Problem:   Portal vein thrombosis Active Problems:   Hyperlipemia   DM (diabetes mellitus) (HCC)   S/P CABG x 3   CAD (coronary artery disease)   Liver cirrhosis (HCC)   Thrombocytopenia (HCC)   Infarction of spleen   Portal vein thrombosis/possible splenic infarct: -Reviewed CT abdomen/pelvis result. -Patient received IV fluids, Zofran and fentanyl for pain control in ED. -Patient is afebrile with no leukocytosis.  Lipase: WNL.  Continue Zofran and fentanyl as needed for pain control. -We will keep her n.p.o., EDP consulted GI-await recommendation -Not a good candidate for anticoagulation due to recent history of variceal bleeding status post banding.  She might need repeat EGD.  NAFLD with cirrhosis and variceal bleeding: -s/p recent EGD with multiple esophageal banding x14-on 03/28/2019: -Patient denies hematemesis or melena. -Continue Protonix, Carafate and nadolol, hold Lasix, spironolactone as patient's blood pressure is on lower side -Monitor H&H.  Monitor signs for bleeding.  -Reviewed CT abdomen/pelvis result. -Resume patient's home dose of Lasix and spironolactone once blood pressure back to baseline. -Strict INO's and daily weight.  Type  2 diabetes mellitus: -She takes Jardiance, NovoLog and Levemir at home -We will start on sliding scale insulin and monitor blood sugar closely  Hyperlipidemia: On Zetia at home.  Will hold for now.  Hypertension: Blood pressure soft  upon arrival -We will continue nadolol and hold Lasix and spironolactone for now and monitor her blood pressure closely.  Chronic thrombocytopenia: -Platelet count at baseline, no signs of bleeding -Monitor for now.    Coronary artery disease status post CABG: Stable -Hold aspirin and Zetia for now.  DVT prophylaxis: TED/SCD-no chemical prophylaxis due to history of variceal bleeding and recent band ligation Code Status: Full code-confirmed with the patient Family Communication: Patient's husband present at bedside.  Plan of care discussed with patient and her husband in length and they verbalized understanding and agreed with it. Disposition Plan: TBD Consults called: GI Dr. Collene Mares by EDP Admission status: Inpatient at stepdown unit  Mckinley Jewel MD [facility] Pager [phone]  If 7PM-7AM, please contact night-coverage [email or web address] Password [facility]  04/06/2019, 6:42 PM

## 2019-04-06 NOTE — ED Provider Notes (Signed)
[facility] EMERGENCY DEPARTMENT Provider Note   CSN: [id number] Arrival date & time: 04/06/19  1242    History   Chief Complaint Chief Complaint  Patient presents with   Abdominal Pain   Emesis   Diarrhea   HPI [name] is a 73 y.o. female with has medical history significant for CHF, CKD, cirrhosis/P esophageal banding 11/19 who presents for evaluation of emesis and abdominal pain.  She has had recurrent issue since she was discharged from the hospital.  She actually was rehospitalized for intractable nausea and vomiting and pain control 04/01/19-11/25.  Patient states she has continued to have pain at home.  Over the last 24 to 48 hours she has not been able to keep her medications down.  She is followed by Dr. [name] with [facility].  She was discharged home with viscous lidocaine and hydrocodone elixir, Protonix and Carafate.  She has been unable to keep any liquids down.  She has tried drinking some ensures however this "comes back up."  Supposed to follow-up outpatient with GI for possible repeat EGD.  Denies any hemoptysis, melena, bright red blood per rectum.  Patient states she has had some mild diarrhea since she left the hospital however states she thought this was likely due to a suppository that she had gotten for prior constipation.  Pain located to epigastric, right upper quadrant where was previously when she was last admitted.  Rates her current pain a 9/10.  Denies fever, chills, chest pain, shortness of breath, dysuria, constipation.  Denies additional aggravating or alleviating factors.  History obtained from patient and past medical records.  No interpreter is used.  GI- [place] Dr. [name].    HPI  Past Medical History:  Diagnosis Date   Allergy    Arthritis    neck   Cataract    bilateral - MD monitoring cataracts   CHF (congestive heart failure) (HCC)    Chronic kidney disease, stage I    DR OTTELIN  HX UTIS   Cirrhosis  ([place])    Cramp of limb    Diabetes mellitus    Dysphagia, unspecified(787.20)    Dysuria    Epistaxis    GERD (gastroesophageal reflux disease)    Heart murmur    NO CARDIOLOGIST  DX FOR YEARS ASYMPTOMATIC   Lumbago    Neoplasm of uncertain behavior of skin    Nonspecific elevation of levels of transaminase or lactic acid dehydrogenase (LDH)    Osteoarthrosis, unspecified whether generalized or localized, unspecified site    Other and unspecified hyperlipidemia    diet controlled   Pain in joint, shoulder region    Paresthesias 04/01/2015   Postablative ovarian failure    Trochanteric bursitis of left hip 12/15/2015   Type 2 diabetes mellitus without complication ([place])    Unspecified essential hypertension    no meds    Patient Active Problem List   Diagnosis Date Noted   Odynophagia 04/01/2019   Dysphagia    Hepatoma ([place]) 01/31/2019   Coronary artery disease of native artery of native heart with stable angina pectoris ([place]) 01/31/2019   Thrombocytopenia ([place]) 09/25/2017   Left forearm pain 05/29/2017   Portal vein thrombosis 04/22/2017   Skin cancer of nose 08/23/2016   History of colonic polyps    Benign neoplasm of rectum    Esophageal varices without bleeding ([place])    Portal hypertensive gastropathy ([place])    Leg cramps 05/18/2016   Routine lab draw 05/13/2016  Iron deficiency anemia 05/09/2016   Statin-induced rhabdomyolysis 05/07/2016   Cirrhosis of liver without ascites ([place]) 04/05/2016   PVC's (premature ventricular contractions) 04/05/2016   CAD (coronary artery disease) 02/16/2016   S/P CABG x 3 02/02/2016   History of non-ST elevation myocardial infarction (NSTEMI) 01/27/2016   DM (diabetes mellitus), type 2, uncontrolled ([place]) 12/15/2015   Trochanteric bursitis of left hip 12/15/2015   S/P lumbar spinal fusion 06/10/2015   Paresthesia 04/01/2015   Osteoporosis 03/31/2015   Hearing loss 06/25/2014   Insomnia  06/25/2014   Hair thinning 06/25/2014   Muscle spasm 10/08/2013   Biceps tendon rupture, proximal 11/24/2012   Hyperlipemia    GERD (gastroesophageal reflux disease)    Diabetes mellitus type 2, controlled ([place])    Essential hypertension     Past Surgical History:  Procedure Laterality Date   BREAST BIOPSY     CARDIAC CATHETERIZATION N/A 01/27/2016   Procedure: Left Heart Cath and Coronary Angiography;  Surgeon: Belva Crome, MD;  Location: [facility];  Service: Cardiovascular;  Laterality: N/A;   COLONOSCOPY  2012   [facility] - polyp   COLONOSCOPY WITH PROPOFOL N/A 07/07/2016   Procedure: COLONOSCOPY WITH PROPOFOL;  Surgeon: [name], MD;  Location: [facility];  Service: Endoscopy;  Laterality: N/A;   CORONARY ARTERY BYPASS GRAFT N/A 01/28/2016   Procedure: CORONARY ARTERY BYPASS GRAFTING (CABG) x 3 USING RIGHT LEG GREATER SAPHENOUS VEIN GRAFT;  Surgeon: Melrose Nakayama, MD;  Location: [facility];  Service: Open Heart Surgery;  Laterality: N/A;   ENDOVEIN HARVEST OF GREATER SAPHENOUS VEIN Right 01/28/2016   Procedure: ENDOVEIN HARVEST OF GREATER SAPHENOUS VEIN;  Surgeon: Melrose Nakayama, MD;  Location: [facility];  Service: Open Heart Surgery;  Laterality: Right;   ESOPHAGEAL BANDING  03/28/2019   Procedure: ESOPHAGEAL BANDING;  Surgeon: [name], MD;  Location: [facility];  Service: Endoscopy;;   ESOPHAGOGASTRODUODENOSCOPY (EGD) WITH PROPOFOL N/A 07/07/2016   Procedure: ESOPHAGOGASTRODUODENOSCOPY (EGD) WITH PROPOFOL;  Surgeon: [name], MD;  [facility];  Service: Endoscopy;  Laterality: N/A;   ESOPHAGOGASTRODUODENOSCOPY (EGD) WITH PROPOFOL N/A 03/28/2019   Procedure: ESOPHAGOGASTRODUODENOSCOPY (EGD) WITH PROPOFOL;  Surgeon: [name], MD;  Location: [facility];  Service: Endoscopy;  Laterality: N/A;   MAXIMUM ACCESS (MAS)POSTERIOR LUMBAR INTERBODY FUSION (PLIF) 1 LEVEL Left 06/10/2015   Procedure: FOR MAXIMUM ACCESS (MAS)  POSTERIOR LUMBAR INTERBODY FUSION (PLIF) LUMBAR THREE-FOUR EXTRAFORAMINAL MICRODISCECTOMY LUMBAR FIVE-SACRAL ONE LEFT;  Surgeon: Eustace Moore, MD;  Location: [facility];  Service: Neurosurgery;  Laterality: Left;   TEE WITHOUT CARDIOVERSION N/A 01/28/2016   Procedure: TRANSESOPHAGEAL ECHOCARDIOGRAM (TEE);  Surgeon: Melrose Nakayama, MD;  Location: [facility];  Service: Open Heart Surgery;  Laterality: N/A;   TUBAL LIGATION  1982   Dr Connye Burkitt   UPPER GASTROINTESTINAL ENDOSCOPY     VAGINAL HYSTERECTOMY  1997   Dr Rande Lawman     OB History   No obstetric history on file.      Home Medications    Prior to Admission medications   Medication Sig Start Date End Date Taking? Authorizing Provider  acetaminophen (TYLENOL) 500 MG tablet Take 500 mg by mouth at bedtime.    Yes [provider]  Aromatic Inhalants (VICKS VAPOR IN) Vicks Vapor Rub apply small amount to outside of nose to help breathing   Yes [provider]  aspirin EC 81 MG tablet Take 1 tablet (81 mg total) by mouth daily. 01/16/17  Yes [name]  C, MD  Biotin 10000 MCG TABS Take 10,000 mcg by mouth every morning.   Yes [provider]  calcium carbonate (OS-CAL) 600 MG TABS Take 600 mg by mouth 2 (two) times daily with a meal.     Yes [provider]  Cholecalciferol (VITAMIN D) 50 MCG (2000 UT) CAPS Take 2,000 Units by mouth daily.    Yes [provider]  Cyanocobalamin (VITAMIN B 12 PO) Take 1,000 mcg by mouth daily.     Yes [provider]  empagliflozin (JARDIANCE) 25 MG TABS tablet Take 25 mg by mouth daily. 01/31/19  Yes Reed, Tiffany L, DO  ezetimibe (ZETIA) 10 MG tablet TAKE 1 TABLET(10 MG) BY MOUTH DAILY Patient taking differently: Take 10 mg by mouth daily.  12/26/18  Yes Reed, Tiffany L, DO  furosemide (LASIX) 40 MG tablet TAKE 1 TABLET(40 MG) BY MOUTH DAILY Patient taking differently: Take 40 mg by mouth daily.  07/23/18  Yes Josue Hector, MD  insulin detemir  (LEVEMIR) 100 UNIT/ML injection Inject 0.25 mLs (25 Units total) into the skin at bedtime. Decrease dose from 48 units to 25 units until diet further advanced.  Then may resume home dose. 04/03/19  Yes [place] Eric J, DO  lidocaine (XYLOCAINE) 2 % solution Use as directed 15 mLs in the mouth or throat 3 (three) times daily before meals for 14 days. 04/03/19 04/17/19 Yes [place] Eric J, DO  loratadine (CLARITIN) 10 MG tablet Take 10 mg by mouth daily as needed for allergies.   Yes [provider]  MAGNESIUM PO Take 500 mg by mouth 2 (two) times daily in the am and at bedtime..   Yes [provider]  Multiple Vitamins-Minerals (MULTIVITAMIN WITH MINERALS) tablet Take 1 tablet by mouth daily.     Yes [provider]  nadolol (CORGARD) 40 MG tablet Take 1 tablet (40 mg total) by mouth daily. 02/28/19  Yes Reed, Tiffany L, DO  NOVOLOG FLEXPEN 100 UNIT/ML FlexPen Inject 12 units under the skin every morning, 8 units at lunch and 12 units at supper Patient taking differently: Inject 8-12 Units into the skin See admin instructions. Inject 12 units under the skin every morning, 8 units at lunch and 12 units at supper 02/19/19  Yes Reed, Tiffany L, DO  pantoprazole (PROTONIX) 40 MG tablet Take 1 tablet (40 mg total) by mouth daily. 04/03/19 06/02/19 Yes [place] Eric J, DO  Polyethyl Glycol-Propyl Glycol (SYSTANE OP) Place 1 drop into both eyes 2 (two) times daily.   Yes [provider]  potassium chloride SA (KLOR-CON) 20 MEQ tablet Take 1 tablet (20 mEq total) by mouth daily. 03/11/19  Yes Reed, Tiffany L, DO  Probiotic Product (PROBIOTIC DAILY PO) Take 1 capsule by mouth daily. Digestive Advantage Probiotic   Yes [provider]  spironolactone (ALDACTONE) 100 MG tablet TAKE 1 TABLET(100 MG) BY MOUTH TWICE DAILY Patient taking differently: Take 100 mg by mouth 2 (two) times daily.  09/10/18  Yes [name], MD  sucralfate (CARAFATE) 1 GM/10ML suspension Take 10  mLs (1 g total) by mouth 4 (four) times daily -  with meals and at bedtime for 14 days. 04/03/19 04/17/19 Yes [place] Donnamarie Poag, DO  BD PEN NEEDLE NANO U/F 32G X 4 MM MISC USE THREE TIMES DAILY AS DIRECTED 11/26/18   Reed, Tiffany L, DO  glucose blood test strip One Touch Ultra II strips. Use to test blood sugar three times daily. Dx: E11.65 06/01/17   Mariea Clonts,  Tiffany L, DO  HYDROcodone-acetaminophen (HYCET) 7.5-325 mg/15 ml solution Take 15 mLs by mouth every 6 (six) hours as needed for up to 10 days for moderate pain. Patient not taking: Reported on 04/06/2019 04/03/19 04/13/19  [place] Donnamarie Poag, DO  Insulin Syringe-Needle U-100 (INSULIN SYRINGE 1CC/31GX5/16") 31G X 5/16" 1 ML MISC USE AS DIRECTED DAILY WITH LEVEMIR 07/27/18   Gayland Curry, DO    Family History Family History  Problem Relation Age of Onset   Lung cancer Father    Arthritis Sister    Arthritis Brother    Heart disease Maternal Grandmother    Heart disease Maternal Grandfather    Heart disease Paternal Grandmother    Heart disease Paternal Grandfather    Breast cancer Mother    Liver cancer Brother    Breast cancer Maternal Aunt    Breast cancer Paternal Aunt    Colon cancer Neg Hx    Esophageal cancer Neg Hx    Rectal cancer Neg Hx    Stomach cancer Neg Hx    Social History Social History   Tobacco Use   Smoking status: Never Smoker   Smokeless tobacco: Never Used  Substance Use Topics   Alcohol use: No   Drug use: No   Allergies   Kiwi extract, Tdap [tetanus-diphth-acell pertussis], Statins, Latex, and Tramadol  Review of Systems Review of Systems  Constitutional: Negative.   HENT: Negative.   Respiratory: Negative.   Cardiovascular: Negative.   Gastrointestinal: Positive for abdominal pain, diarrhea, nausea and vomiting. Negative for abdominal distention, anal bleeding, blood in stool, constipation and rectal pain.  Genitourinary: Negative.   Musculoskeletal: Negative.   Skin: Negative.    Neurological: Negative.   All other systems reviewed and are negative.   Physical Exam Updated Vital Signs BP (!) 107/48    Pulse 78    Temp 98.2 F (36.8 C) (Oral)    Resp 18    SpO2 99%   Physical Exam Vitals signs and nursing note reviewed.  Constitutional:      General: She is not in acute distress.    Appearance: She is well-developed. She is ill-appearing (Chronically ill appearing in no acute distress). She is not toxic-appearing or diaphoretic.  HENT:     Head: Normocephalic and atraumatic.     Mouth/Throat:     Comments: Dry mucous membranes Eyes:     Pupils: Pupils are equal, round, and reactive to light.  Neck:     Musculoskeletal: Normal range of motion.  Cardiovascular:     Rate and Rhythm: Normal rate.     Heart sounds: Normal heart sounds.  Pulmonary:     Effort: Pulmonary effort is normal. No respiratory distress.     Breath sounds: Normal breath sounds.     Comments: Speaks in full sentences without difficulty. Chest:     Comments: Old midline surgical scar to chest Abdominal:     General: There is no distension.     Comments: Soft, tenderness to epigastric region  Musculoskeletal: Normal range of motion.     Comments: Moves all 4 extremities without difficulty  Skin:    General: Skin is warm and dry.     Comments: No rashes or lesions. Brisk cap refill.  Neurological:     Mental Status: She is alert.     Cranial Nerves: Cranial nerves are intact.     Sensory: Sensation is intact.     Motor: Motor function is intact.     ED Treatments /  Results  Labs (all labs ordered are listed, but only abnormal results are displayed) Labs Reviewed  COMPREHENSIVE METABOLIC PANEL - Abnormal; Notable for the following components:      Result Value   Sodium 133 (*)    Glucose, Bld 211 (*)    Albumin 2.9 (*)    AST 46 (*)    Total Bilirubin 1.4 (*)    All other components within normal limits  CBC - Abnormal; Notable for the following components:    Platelets 117 (*)    All other components within normal limits  LIPASE, BLOOD  URINALYSIS, ROUTINE W REFLEX MICROSCOPIC  POC SARS CORONAVIRUS 2 AG -  ED    EKG EKG Interpretation  Date/Time:  Saturday April 06 2019 12:58:44 EST Ventricular Rate:  74 PR Interval:  144 QRS Duration: 96 QT Interval:  416 QTC Calculation: 461 R Axis:   -29 Text Interpretation: Normal sinus rhythm Minimal voltage criteria for LVH, may be normal variant ( R in aVL ) Nonspecific ST abnormality Abnormal ECG since last tracing no significant change Confirmed by Daleen Bo [phone]) on 04/06/2019 2:19:58 PM   Radiology Ct Abdomen Pelvis W Contrast  Result Date: 04/06/2019 CLINICAL DATA:  Patient with vomiting, diarrhea and right upper quadrant pain. Status post esophageal band. EXAM: CT ABDOMEN AND PELVIS WITH CONTRAST TECHNIQUE: Multidetector CT imaging of the abdomen and pelvis was performed using the standard protocol following bolus administration of intravenous contrast. CONTRAST:  148m OMNIPAQUE IOHEXOL 300 MG/ML  SOLN COMPARISON:  MRI abdomen 04/07/2017 FINDINGS: Lower chest: Normal heart size. Dependent atelectasis within the bilateral lower lobes. No pleural effusion. Hepatobiliary: Morphologic changes to the liver compatible with cirrhosis. Heterogeneous attenuation of the liver. Mild gallbladder wall thickening, likely secondary to cirrhosis. No definite intrahepatic or extrahepatic biliary ductal dilatation. The portal vein is attenuated centrally (image 22; series 3) most compatible thrombus. Pancreas: Unremarkable Spleen: Enlarged and heterogeneous in attenuation. And like low-attenuation centrally. Splenic infarct not entirely excluded. Adrenals/Urinary Tract: Normal adrenal glands. Kidneys enhance symmetrically with contrast. No hydronephrosis. Urinary bladder is unremarkable. Stomach/Bowel: There is circumferential wall thickening of the cecum and ascending colon. No evidence for small bowel  obstruction. Normal morphology of the stomach. Small amount of fluid within the small bowel mesentery. Paraesophageal varices. Vascular/Lymphatic: Normal caliber abdominal aorta. Peripheral calcified atherosclerotic plaque. No retroperitoneal lymphadenopathy. Reproductive: Status post hysterectomy. Other: Moderate volume ascites throughout the abdomen. Musculoskeletal: Lumbar spine degenerative changes. Lumbar spinal fusion hardware. No aggressive or acute appearing osseous lesions. IMPRESSION: 1. The portal vein is attenuated centrally most compatible with portal venous thrombus which appears to have progressed from prior exam. 2. There is circumferential wall thickening of the cecum and ascending colon which may be secondary to portal venous hypertension or colitis. 3. Morphologic changes to the liver compatible with cirrhosis. Heterogeneous attenuation of the liver which may be secondary to cirrhosis. 4. Bandlike heterogeneity of the spleen raising the possibility of splenic infarct. 5. Moderate volume ascites throughout the abdomen and pelvis. 6. Paraesophageal varices. Electronically Signed   By: DLovey NewcomerM.D.   On: 04/06/2019 15:56    Procedures Procedures (including critical care time)  Medications Ordered in ED Medications  sodium chloride flush (NS) 0.9 % injection 3 mL (3 mLs Intravenous Given 04/06/19 1444)  sodium chloride 0.9 % bolus 500 mL (500 mLs Intravenous New Bag/Given 04/06/19 1450)  ondansetron (ZOFRAN) injection 4 mg (4 mg Intravenous Given 04/06/19 1444)  fentaNYL (SUBLIMAZE) injection 12.5 mcg (12.5 mcg Intravenous Given 04/06/19  1445)  iohexol (OMNIPAQUE) 300 MG/ML solution 100 mL (100 mLs Intravenous Contrast Given 04/06/19 1514)  sodium chloride 0.9 % bolus 500 mL (500 mLs Intravenous New Bag/Given 04/06/19 1727)  lidocaine (XYLOCAINE) 2 % viscous mouth solution 15 mL (15 mLs Mouth/Throat Given 04/06/19 1639)    Initial Impression / Assessment and Plan / ED Course  I  have reviewed the triage vital signs and the nursing notes.  Pertinent labs & imaging results that were available during my care of the patient were reviewed by me and considered in my medical decision making (see chart for details).  73 year old female presents for evaluation of persistent emesis as well as abdominal pain GL banding on 19th of this month.  Was discharged on the 25th and has been unable to tolerate p.o. intake with persistent emesis over the last 24 to 36 hours.  Emesis is not on bloody, nonbilious.  She has had some loose stools over the last 3 days without melena or bright red blood per rectum.  No evidence of active esophageal bleed at this time.  Her abdomen is mildly tender to epigastric region, negative Murphy sign.  Heart and lungs are clear.  Patient denies any chest pain, shortness of breath.  I have low suspicion for atypical ACS, dissection, PE as cause of patient's pain and emesis.  Plan for pain control, IV fluids, imaging and consult with GI.  Labs and imaging personally reviewed:  6073: CONSULT : Dr. Collene Mares with [facility]. Discussed CT findings. Recommend medicine admission, NPO patient will likely need EGD. Unsure if tonight or tomorrow however she will evaluate patient. No anticoagulation for portal vein thrombosis given contraindicated in esophageal varices/ ulcers.  Clinical Course as of Apr 05 1825  Sat Apr 06, 2019  1728 No leukocytosis, Hgb stable, improved from previous  CBC(!) [BH]  1728 Mild hyponatremia at 133, Bili 1.4, previous 1.1  Comprehensive metabolic panel(!) [BH]  7106 normal  Lipase, blood [BH]  1729 Possible Portal vein thrombosis, possible splenic infarct, colitis vs portal vein HTN, ascites  CT Abdomen Pelvis W Contrast [BH]  1730 No ST,T changes. No STEMI  EKG 12-Lead [BH]    Clinical Course User Index [BH] Rojean Ige A, PA-C   1820: CONSULT: With Dr. Doristine Bosworth with [facility] who will evaluate patient for admission.  Plan to hold on  antibiotics for possible colitis versus possible portal vein hypertension as cause of findings on CT scan.  Patient reevaluated, discussed plan.  She is agreeable to admission.  No evidence of esophageal bleeding at this time.  Patient hemodynamically stable and appropriate for admission  The patient appears reasonably stabilized for admission considering the current resources, flow, and capabilities available in the ED at this time, and I doubt any other [facility] requiring further screening and/or treatment in the ED prior to admission.   Final Clinical Impressions(s) / ED Diagnoses   Final diagnoses:  Cirrhosis of liver with ascites, unspecified hepatic cirrhosis type ([place])  Dysphagia, unspecified type  Nausea vomiting and diarrhea    ED Discharge Orders    None       Nathalya Wolanski A, PA-C 04/06/19 1826    Daleen Bo, MD 04/09/19 2329

## 2019-04-07 ENCOUNTER — Inpatient Hospital Stay (HOSPITAL_COMMUNITY): Payer: Medicare Other | Admitting: Certified Registered Nurse Anesthetist

## 2019-04-07 ENCOUNTER — Encounter (HOSPITAL_COMMUNITY)
Admission: EM | Disposition: A | Discharge status: Home Health | Payer: Self-pay | Source: Home / Self Care | Attending: Internal Medicine

## 2019-04-07 ENCOUNTER — Encounter (HOSPITAL_COMMUNITY): Payer: Self-pay | Admitting: *Deleted

## 2019-04-07 DIAGNOSIS — K7460 Unspecified cirrhosis of liver: Secondary | ICD-10-CM | POA: Diagnosis not present

## 2019-04-07 DIAGNOSIS — I2510 Atherosclerotic heart disease of native coronary artery without angina pectoris: Secondary | ICD-10-CM

## 2019-04-07 DIAGNOSIS — D62 Acute posthemorrhagic anemia: Secondary | ICD-10-CM | POA: Diagnosis not present

## 2019-04-07 DIAGNOSIS — I8511 Secondary esophageal varices with bleeding: Secondary | ICD-10-CM | POA: Diagnosis not present

## 2019-04-07 DIAGNOSIS — I81 Portal vein thrombosis: Secondary | ICD-10-CM

## 2019-04-07 DIAGNOSIS — K7581 Nonalcoholic steatohepatitis (NASH): Secondary | ICD-10-CM

## 2019-04-07 HISTORY — PX: SCLEROTHERAPY: SHX6841

## 2019-04-07 HISTORY — PX: ESOPHAGOGASTRODUODENOSCOPY: SHX5428

## 2019-04-07 HISTORY — PX: ESOPHAGEAL BANDING: SHX5518

## 2019-04-07 HISTORY — PX: HEMOSTASIS CLIP PLACEMENT: SHX6857

## 2019-04-07 LAB — CBC
HCT: 39 % (ref 36.0–46.0)
HCT: 39.8 % (ref 36.0–46.0)
Hemoglobin: 13 g/dL (ref 12.0–15.0)
Hemoglobin: 12.7 g/dL (ref 12.0–15.0)
MCH: 31.3 pg (ref 26.0–34.0)
MCH: 31.6 pg (ref 26.0–34.0)
MCHC: 33.3 g/dL (ref 30.0–36.0)
MCHC: 31.9 g/dL (ref 30.0–36.0)
MCV: 94 fL (ref 80.0–100.0)
MCV: 99 fL (ref 80.0–100.0)
Platelets: 106 10*3/uL — ABNORMAL LOW (ref 150–400)
Platelets: 98 10*3/uL — ABNORMAL LOW (ref 150–400)
RBC: 4.15 MIL/uL (ref 3.87–5.11)
RBC: 4.02 MIL/uL (ref 3.87–5.11)
RDW: 13.9 % (ref 11.5–15.5)
RDW: 13.9 % (ref 11.5–15.5)
WBC: 6 10*3/uL (ref 4.0–10.5)
WBC: 10 10*3/uL (ref 4.0–10.5)
nRBC: 0 % (ref 0.0–0.2)
nRBC: 0 % (ref 0.0–0.2)

## 2019-04-07 LAB — COMPREHENSIVE METABOLIC PANEL
ALT: 29 U/L (ref 0–44)
AST: 36 U/L (ref 15–41)
Albumin: 2.4 g/dL — ABNORMAL LOW (ref 3.5–5.0)
Alkaline Phosphatase: 71 U/L (ref 38–126)
Anion gap: 9 (ref 5–15)
BUN: 12 mg/dL (ref 8–23)
CO2: 22 mmol/L (ref 22–32)
Calcium: 8.5 mg/dL — ABNORMAL LOW (ref 8.9–10.3)
Chloride: 104 mmol/L (ref 98–111)
Creatinine, Ser: 0.9 mg/dL (ref 0.44–1.00)
GFR calc Af Amer: >60 mL/min (ref >60)
GFR calc non Af Amer: >60 mL/min (ref >60)
Glucose, Bld: 181 mg/dL — ABNORMAL HIGH (ref 70–99)
Potassium: 3.7 mmol/L (ref 3.5–5.1)
Sodium: 135 mmol/L (ref 135–145)
Total Bilirubin: 1.3 mg/dL — ABNORMAL HIGH (ref 0.3–1.2)
Total Protein: 6 g/dL — ABNORMAL LOW (ref 6.5–8.1)

## 2019-04-07 LAB — SARS CORONAVIRUS 2 (TAT 6-24 HRS): SARS Coronavirus 2: NEGATIVE

## 2019-04-07 LAB — GLUCOSE, CAPILLARY
Glucose-Capillary: 169 mg/dL — ABNORMAL HIGH (ref 70–99)
Glucose-Capillary: 170 mg/dL — ABNORMAL HIGH (ref 70–99)
Glucose-Capillary: 195 mg/dL — ABNORMAL HIGH (ref 70–99)
Glucose-Capillary: 172 mg/dL — ABNORMAL HIGH (ref 70–99)
Glucose-Capillary: 139 mg/dL — ABNORMAL HIGH (ref 70–99)

## 2019-04-07 LAB — MRSA PCR SCREENING: MRSA by PCR: NEGATIVE

## 2019-04-07 LAB — PROTIME-INR
INR: 1.2 (ref 0.8–1.2)
Prothrombin Time: 15.4 seconds — ABNORMAL HIGH (ref 11.4–15.2)

## 2019-04-07 SURGERY — EGD (ESOPHAGOGASTRODUODENOSCOPY)
Anesthesia: Moderate Sedation

## 2019-04-07 MED ORDER — SUCCINYLCHOLINE CHLORIDE 200 MG/10ML IV SOSY
PREFILLED_SYRINGE | INTRAVENOUS | Status: DC | PRN
  Administered 2019-04-07: 120 mg via INTRAVENOUS

## 2019-04-07 MED ORDER — ALBUMIN HUMAN 25 % IV SOLN
50.0000 g | Freq: Four times a day (QID) | INTRAVENOUS | Status: AC
  Administered 2019-04-07: 07:00:00 50 g via INTRAVENOUS
  Filled 2019-04-07: qty 150
  Filled 2019-04-07: qty 50

## 2019-04-07 MED ORDER — PROPOFOL 10 MG/ML IV BOLUS
INTRAVENOUS | Status: DC | PRN
  Administered 2019-04-07: 120 mg via INTRAVENOUS

## 2019-04-07 MED ORDER — SODIUM CHLORIDE (PF) 0.9 % IJ SOLN
PREFILLED_SYRINGE | INTRAMUSCULAR | Status: DC | PRN
  Administered 2019-04-07: 11:00:00 3 mL
  Administered 2019-04-07: 10:00:00 9 mL

## 2019-04-07 MED ORDER — WHITE PETROLATUM EX OINT
TOPICAL_OINTMENT | CUTANEOUS | Status: AC
  Administered 2019-04-07: 0.2
  Filled 2019-04-07: qty 28.35

## 2019-04-07 MED ORDER — SODIUM CHLORIDE 0.9 % IV SOLN: INTRAVENOUS | Status: DC

## 2019-04-07 MED ORDER — PHENYLEPHRINE 40 MCG/ML (10ML) SYRINGE FOR IV PUSH (FOR BLOOD PRESSURE SUPPORT)
PREFILLED_SYRINGE | INTRAVENOUS | Status: DC | PRN
  Administered 2019-04-07 (×2): 120 ug via INTRAVENOUS

## 2019-04-07 MED ORDER — ALBUMIN HUMAN 25 % IV SOLN
50.0000 g | Freq: Four times a day (QID) | INTRAVENOUS | Status: AC
  Administered 2019-04-07: 21:00:00 12.5 g via INTRAVENOUS
  Administered 2019-04-07: 50 g via INTRAVENOUS
  Filled 2019-04-07 (×2): qty 50

## 2019-04-07 MED ORDER — SODIUM CHLORIDE 0.9 % IV SOLN
INTRAVENOUS | Status: DC
  Administered 2019-04-07: 07:00:00 via INTRAVENOUS

## 2019-04-07 MED ORDER — CHLORHEXIDINE GLUCONATE CLOTH 2 % EX PADS
6.0000 | MEDICATED_PAD | Freq: Every day | CUTANEOUS | Status: DC
  Administered 2019-04-07 – 2019-04-11 (×5): 6 via TOPICAL

## 2019-04-07 MED ORDER — FENTANYL CITRATE (PF) 250 MCG/5ML IJ SOLN
INTRAMUSCULAR | Status: DC | PRN
  Administered 2019-04-07: 50 ug via INTRAVENOUS

## 2019-04-07 MED ORDER — INSULIN ASPART 100 UNIT/ML ~~LOC~~ SOLN
0.0000 [IU] | SUBCUTANEOUS | Status: DC
  Administered 2019-04-08 (×3): 2 [IU] via SUBCUTANEOUS
  Administered 2019-04-09: 11 [IU] via SUBCUTANEOUS
  Administered 2019-04-09: 8 [IU] via SUBCUTANEOUS
  Administered 2019-04-09 (×2): 5 [IU] via SUBCUTANEOUS

## 2019-04-07 MED ORDER — ALBUMIN HUMAN 5 % IV SOLN
INTRAVENOUS | Status: DC | PRN
  Administered 2019-04-07: 11:00:00 via INTRAVENOUS

## 2019-04-07 MED ORDER — PANTOPRAZOLE SODIUM 40 MG IV SOLR
40.0000 mg | Freq: Two times a day (BID) | INTRAVENOUS | Status: DC
  Administered 2019-04-11 (×2): 40 mg via INTRAVENOUS
  Filled 2019-04-07 (×2): qty 40

## 2019-04-07 MED ORDER — PHENYLEPHRINE HCL-NACL 10-0.9 MG/250ML-% IV SOLN
INTRAVENOUS | Status: DC | PRN
  Administered 2019-04-07: 50 ug/min via INTRAVENOUS

## 2019-04-07 MED ORDER — FENTANYL CITRATE (PF) 100 MCG/2ML IJ SOLN
INTRAMUSCULAR | Status: AC
  Filled 2019-04-07: qty 2

## 2019-04-07 MED ORDER — NADOLOL 20 MG PO TABS
20.0000 mg | ORAL_TABLET | Freq: Every day | ORAL | Status: DC
  Administered 2019-04-09: 20 mg via ORAL
  Filled 2019-04-07 (×3): qty 1

## 2019-04-07 MED ORDER — LIDOCAINE VISCOUS HCL 2 % MT SOLN
OROMUCOSAL | Status: AC
  Filled 2019-04-07: qty 15

## 2019-04-07 MED ORDER — SODIUM CHLORIDE 0.9% IV SOLUTION: Freq: Once | INTRAVENOUS | Status: DC

## 2019-04-07 MED ORDER — MIDAZOLAM HCL (PF) 10 MG/2ML IJ SOLN
INTRAMUSCULAR | Status: DC | PRN
  Administered 2019-04-07 (×2): 2 mg via INTRAVENOUS
  Administered 2019-04-07: 1 mg via INTRAVENOUS

## 2019-04-07 MED ORDER — ONDANSETRON HCL 4 MG/2ML IJ SOLN: 4.0000 mg | Freq: Once | INTRAMUSCULAR | Status: DC | PRN

## 2019-04-07 MED ORDER — ALBUMIN HUMAN 25 % IV SOLN
50.0000 g | Freq: Four times a day (QID) | INTRAVENOUS | Status: DC
  Administered 2019-04-07: 37.5 g via INTRAVENOUS
  Filled 2019-04-07: qty 200

## 2019-04-07 MED ORDER — LIDOCAINE VISCOUS HCL 2 % MT SOLN
OROMUCOSAL | Status: DC | PRN
  Administered 2019-04-07: 5 mL via OROMUCOSAL

## 2019-04-07 MED ORDER — ONDANSETRON HCL 4 MG/2ML IJ SOLN
INTRAMUSCULAR | Status: DC | PRN
  Administered 2019-04-07: 4 mg via INTRAVENOUS

## 2019-04-07 MED ORDER — ALBUMIN HUMAN 5 % IV SOLN
INTRAVENOUS | Status: AC
  Filled 2019-04-07: qty 250

## 2019-04-07 MED ORDER — SODIUM CHLORIDE 0.9 % IV SOLN
INTRAVENOUS | Status: AC
  Administered 2019-04-07 – 2019-04-08 (×2): via INTRAVENOUS

## 2019-04-07 MED ORDER — MIDAZOLAM HCL (PF) 5 MG/ML IJ SOLN
INTRAMUSCULAR | Status: AC
  Filled 2019-04-07: qty 2

## 2019-04-07 MED ORDER — SODIUM CHLORIDE 0.9 % IV SOLN
50.0000 ug/h | INTRAVENOUS | Status: DC
  Administered 2019-04-07: 22:00:00 50 ug/h via INTRAVENOUS
  Administered 2019-04-07: 25 ug/h via INTRAVENOUS
  Administered 2019-04-08 – 2019-04-11 (×7): 50 ug/h via INTRAVENOUS
  Filled 2019-04-07 (×6): qty 1
  Filled 2019-04-07: qty 5
  Filled 2019-04-07: qty 1
  Filled 2019-04-07: qty 5
  Filled 2019-04-07 (×2): qty 1

## 2019-04-07 MED ORDER — SODIUM CHLORIDE 0.9 % IV SOLN
80.0000 mg | Freq: Once | INTRAVENOUS | Status: AC
  Administered 2019-04-07: 80 mg via INTRAVENOUS
  Filled 2019-04-07: qty 80

## 2019-04-07 MED ORDER — SODIUM CHLORIDE 0.9 % IV SOLN
8.0000 mg/h | INTRAVENOUS | Status: AC
  Administered 2019-04-07 – 2019-04-10 (×7): 8 mg/h via INTRAVENOUS
  Filled 2019-04-07 (×11): qty 80

## 2019-04-07 MED ORDER — FENTANYL CITRATE (PF) 100 MCG/2ML IJ SOLN
INTRAMUSCULAR | Status: DC | PRN
  Administered 2019-04-07 (×2): 25 ug via INTRAVENOUS
  Administered 2019-04-07: 50 ug via INTRAVENOUS

## 2019-04-07 MED ORDER — FENTANYL CITRATE (PF) 100 MCG/2ML IJ SOLN
25.0000 ug | INTRAMUSCULAR | Status: DC | PRN
  Administered 2019-04-07: 13:00:00 25 ug via INTRAVENOUS

## 2019-04-07 NOTE — Progress Notes (Signed)
EGD with Dr. Collene Mares started out moderate sedation. Pt received 33m Versed and 522m Fentanyl.  Pt stable with sedation. See VS record. Anesthesia called to bedside to help protect airway to continue with EGD.  KeElmer SowRNA to bedside at [phone]. JoLinna Capriceo bedside at 1017. Anesthesia took over care of patient sedation and VS monitoring at this time. See anesthesia record.

## 2019-04-07 NOTE — Op Note (Addendum)
[facility] Patient Name: [name] Procedure Date : 04/07/2019 MRN: [id number] Attending MD: Juanita Craver , MD Date of Birth: [birth year] CSN: [id number] Age: 73 Admit Type: Inpatient Procedure:                EGD with control of intraoperative bleeding. Indications:              Dysphagia, Odynophagia s/[p banding on 03/28/2019,                            NASH cirrhosis, PVT. Providers:                Juanita Craver, MD, Elmer Ramp. Tilden Dome, RN, William Dalton, Technician, Clarene Essex, MD, Roberts Gaudy, MD Referring MD:             THP Medicines:                Monitored Anesthesia Care Complications:            No immediate complications. Estimated Blood Loss:     Estimated blood loss: 100 mL Procedure:                Pre-Anesthesia Assessment: - Prior to the                            procedure, a history and physical was performed,                            and patient medications and allergies were                            reviewed. The patient's tolerance of previous                            anesthesia was also reviewed. The risks and                            benefits of the procedure and the sedation options                            and risks were discussed with the patient. All                            questions were answered, and informed consent was                            obtained. Prior Anticoagulants: The patient has                            taken no previous anticoagulant or antiplatelet                            agents. ASA Grade Assessment: III - A patient with  severe systemic disease. After reviewing the risks                            and benefits, the patient was deemed in                            satisfactory condition to undergo the procedure.                            After obtaining informed consent, the endoscope was                            passed under direct vision.  Throughout the                            procedure, the patient's blood pressure, pulse, and                            oxygen saturations were monitored continuously. The                            GIF-H190 ([id number]) Olympus gastroscope was                            introduced through the mouth, and advanced to the                            duodenal bulb. The EGD was extremely difficult due                            to the patient's medical instability. Dr. Clarene Essex was rerquested to help with the procedure.                            Successful completion of the procedure was aided by                            controlling the bleeding. The patient tolerated the                            procedure well. Scope In: Scope Out: Findings:      Many superficial esophageal ulcers were noted along the length of the       esophagus; there was a bleeding noted from what appeared to a distal       varix just above the Z-line but after 3 banding slipped and epinephrine       was injected at the base of what appeared the bleeding ulcer. 3       resolution clips were applied; one fell into the stomach and 2 were       applied successfully over the disbleeding vessel which appeared to be an       ulcer. actively bleeding were found. The  largest lesion was 7 mm in       largest dimension. Area was successfully injected with 9 mL of a       1:10,000 solution of epinephrine for drug delivery. Three hemostatic       clips were applied and 2 were successfully placed (MR unsafe). There was       no bleeding at the end of the procedure.      One oozing varix was noted at 30 cm from the incisors; one band was       successfully placed. There was no bleeding at the end of the procedure.      Portal hypertensive gastropathy was found in the entire examined stomach.      The duodenal bulb was normal. Impression:               - Multiple esophageal ulcers-one was actively                             bleeding in the distal; injected with epinephrine                            and 2 clips (MR unsafe) were placed.                           - Esophageal varix at 35 cm-ulcer with stigmata of                            recent bleeding-banded x 1..                           - Portal hypertensive gastropathy.                           - Normal duodenal bulb.                           - No specimens collected. Moderate Sedation:      MAC used. Recommendation:           - NPO today.                           - Continue serial CBC's.                           - Continue Octerotide..                           - Case discussed with Dr. Kathlene Cote for possible                            TIPS. Procedure Code(s):        --- Professional ---                           [phone], Esophagogastroduodenoscopy, flexible,                            transoral; with control of bleeding, any method Diagnosis Code(s):        ---  Professional ---                           R13.10, Dysphagia, unspecified                           K22.11, Ulcer of esophagus with bleeding                           K76.6, Portal hypertension CPT copyright 2019 [employer]. All rights reserved. The codes documented in this report are preliminary and upon coder review may  be revised to meet current compliance requirements. Juanita Craver, MD Juanita Craver, MD 04/07/2019 7:29:41 PM This report has been signed electronically. Number of Addenda: 0

## 2019-04-07 NOTE — Anesthesia Procedure Notes (Signed)
Procedure Name: Intubation Date/Time: 04/07/2019 10:23 AM Performed by: Bryson Corona, CRNA Pre-anesthesia Checklist: Patient identified, Emergency Drugs available, Suction available and Patient being monitored Patient Re-evaluated:Patient Re-evaluated prior to induction Oxygen Delivery Method: Circle System Utilized Preoxygenation: Pre-oxygenation with 100% oxygen Induction Type: IV induction Ventilation: Mask ventilation without difficulty Laryngoscope Size: Glidescope and 3 Grade View: Grade I Tube type: Oral Tube size: 7.0 mm Number of attempts: 1 Airway Equipment and Method: Stylet and Oral airway Placement Confirmation: ETT inserted through vocal cords under direct vision,  positive ETCO2 and breath sounds checked- equal and bilateral Secured at: 21 cm Tube secured with: Tape Dental Injury: Teeth and Oropharynx as per pre-operative assessment

## 2019-04-07 NOTE — Transfer of Care (Signed)
Immediate Anesthesia Transfer of Care Note  Patient: [name]  Procedure(s) Performed: ESOPHAGOGASTRODUODENOSCOPY (EGD) (N/A )  Patient Location: PACU  Anesthesia Type:General  Level of Consciousness: drowsy  Airway & Oxygen Therapy: Patient Spontanous Breathing and Patient connected to face mask oxygen  Post-op Assessment: Report given to RN and Post -op Vital signs reviewed and stable  Post vital signs: Reviewed and stable  Last Vitals:  Vitals Value Taken Time  BP 103/63 04/07/19 1138  Temp    Pulse 88   Resp 17 04/07/19 1138  SpO2 100   Vitals shown include unvalidated device data.  Last Pain:  Vitals:   04/07/19 0952  TempSrc:   PainSc: 0-No pain      Patients Stated Pain Goal: 0 (18/36/72 5500)  Complications: No apparent anesthesia complications

## 2019-04-07 NOTE — H&P (Signed)
Patient presents to the endoscopy unit for an EGD for further evaluation of dysphagia and odynophagia  After esophageal banding f varices x 14 on 03/28/2019 by Dr. [name]. Risks and benefits discussed with her in details. Patient agrees and wants to proceed.  [name] Kitchen

## 2019-04-07 NOTE — Progress Notes (Signed)
PROCEDURE NOTE  04/06/2019 - 04/07/2019  11:34 AM  PATIENT:  [name]  73 y.o. female  PRE-OPERATIVE DIAGNOSIS: Active GI bleeding  POST-OPERATIVE DIAGNOSIS: Varices and esophageal ulcers  PROCEDURE: I was asked to emergently help Dr. Collene Mares when the bleeding continued despite endoscopic therapies and I retried to place bands over the distal esophageal oozing sites but they were unable to be placed and since she had already injected epinephrine and unfortunately no sclerosant were available I went ahead and placed 3 clips but 1 fell off and unfortunately the third clip caused some oozing so I went ahead and injected 6  cc of epi into 3 different sites 2 cc each with good cessation of bleeding but on withdrawing the scope at 30 cm there was a oozing varix with a white nipple sign and we went ahead and put one band on that with nice cessation of bleeding and the distal esophageal ulcers were no longer bleeding and we elected to stop the procedure at this point and all blood and fluid was suctioned from the stomach and the patient tolerated the procedure well  SURGEON:  Surgeon(s): Juanita Craver, MD and Jiyan Walkowski  ASSESSMENT/FINDINGS: Bleeding from esophageal varices and post band ulcers  PLAN OF CARE: Supportive care consider TIPS repeat endoscopy as needed

## 2019-04-07 NOTE — Anesthesia Preprocedure Evaluation (Signed)
Anesthesia Evaluation  Patient identified by MRN, date of birth, ID band Patient awake    Reviewed: Allergy & Precautions, NPO status , Patient's Chart, lab work & pertinent test results  Airway Mallampati: II   Neck ROM: Limited    Dental   Pulmonary     + decreased breath sounds      Cardiovascular hypertension,  Rhythm:Regular Rate:Tachycardia     Neuro/Psych    GI/Hepatic   Endo/Other  diabetes  Renal/GU      Musculoskeletal   Abdominal   Peds  Hematology   Anesthesia Other Findings   Reproductive/Obstetrics                             Anesthesia Physical Anesthesia Plan  ASA: IV and emergent  Anesthesia Plan: General   Post-op Pain Management:    Induction: Intravenous, Rapid sequence and Cricoid pressure planned  PONV Risk Score and Plan: Ondansetron  Airway Management Planned: Oral ETT  Additional Equipment:   Intra-op Plan:   Post-operative Plan: Possible Post-op intubation/ventilation  Informed Consent:     Only emergency history available  Plan Discussed with: Anesthesiologist and CRNA  Anesthesia Plan Comments:         Anesthesia Quick Evaluation

## 2019-04-07 NOTE — Anesthesia Postprocedure Evaluation (Signed)
Anesthesia Post Note  Patient: [name]  Procedure(s) Performed: ESOPHAGOGASTRODUODENOSCOPY (EGD) (N/A ) [place]     Patient location during evaluation: PACU Anesthesia Type: General Level of consciousness: awake and alert Pain management: pain level controlled Vital Signs Assessment: post-procedure vital signs reviewed and stable Respiratory status: spontaneous breathing, nonlabored ventilation, respiratory function stable and patient connected to nasal cannula oxygen Cardiovascular status: blood pressure returned to baseline and stable Postop Assessment: no apparent nausea or vomiting Anesthetic complications: no    Last Vitals:  Vitals:   04/07/19 1140 04/07/19 1155  BP: 103/63 (!) 93/57  Pulse: 88 89  Resp: (!) 31 (!) 23  Temp: (!) 36.1 C   SpO2: 99% 96%    Last Pain:  Vitals:   04/07/19 0952  TempSrc:   PainSc: 0-No pain                 Starlette Thurow COKER

## 2019-04-07 NOTE — Progress Notes (Signed)
Chief Complaint: Patient was seen in consultation today for possible TIPS evaluation at the request of Dr. Collene Mares  Referring Physician(s): Dr. Collene Mares  Supervising Physician: Aletta Edouard  Patient Status: [facility] - In-pt  History of Present Illness: [name] is a 73 y.o. female with hx of NASH cirrhosis and known esophageal varices with recurrent bleeding. Called from Endoscopy by Dr. Collene Mares due to continued bleeding and possible need for TIPS procedure if not able to achieve hemostasis. Dr. Watt Climes assisted in EGD in was able to clip, inject, and band multiple varices with cessation of bleeding. IR is asked to consult in the event of recurrent bleeding and possible need for emergent TIPS procedure. Pt seen in PACU after EGD. Pt awake and able to converse PMHx, meds, labs, imaging, allergies reviewed.    Past Medical History:  Diagnosis Date  . Allergy   . Arthritis    neck  . Cataract    bilateral - MD monitoring cataracts  . CHF (congestive heart failure) ([place])   . Chronic kidney disease, stage I    DR OTTELIN  HX UTIS  . Cirrhosis ([place])   . Cramp of limb   . Diabetes mellitus   . Dysphagia, unspecified(787.20)   . Dysuria   . Epistaxis   . GERD (gastroesophageal reflux disease)   . Heart murmur    NO CARDIOLOGIST  DX FOR YEARS ASYMPTOMATIC  . Lumbago   . Neoplasm of uncertain behavior of skin   . Nonspecific elevation of levels of transaminase or lactic acid dehydrogenase (LDH)   . Osteoarthrosis, unspecified whether generalized or localized, unspecified site   . Other and unspecified hyperlipidemia    diet controlled  . Pain in joint, shoulder region   . Paresthesias 04/01/2015  . Postablative ovarian failure   . Trochanteric bursitis of left hip 12/15/2015  . Type 2 diabetes mellitus without complication ([place])   . Unspecified essential hypertension    no meds    Past Surgical History:  Procedure Laterality Date  . BREAST BIOPSY    . CARDIAC  CATHETERIZATION N/A 01/27/2016   Procedure: Left Heart Cath and Coronary Angiography;  Surgeon: Belva Crome, MD;  Location: [place] [facility];  Service: Cardiovascular;  Laterality: N/A;  . COLONOSCOPY  2012   [facility] - polyp  . COLONOSCOPY WITH PROPOFOL N/A 07/07/2016   Procedure: COLONOSCOPY WITH PROPOFOL;  Surgeon: [name], MD;  Location: [facility];  Service: Endoscopy;  Laterality: N/A;  . CORONARY ARTERY BYPASS GRAFT N/A 01/28/2016   Procedure: CORONARY ARTERY BYPASS GRAFTING (CABG) x 3 USING RIGHT LEG GREATER SAPHENOUS VEIN GRAFT;  Surgeon: Melrose Nakayama, MD;  Location: [place];  Service: Open Heart Surgery;  Laterality: N/A;  . ENDOVEIN HARVEST OF GREATER SAPHENOUS VEIN Right 01/28/2016   Procedure: ENDOVEIN HARVEST OF GREATER SAPHENOUS VEIN;  Surgeon: Melrose Nakayama, MD;  Location: [place];  Service: Open Heart Surgery;  Laterality: Right;  . ESOPHAGEAL BANDING  03/28/2019   Procedure: ESOPHAGEAL BANDING;  Surgeon: [name], MD;  Location: [facility];  Service: Endoscopy;;  . ESOPHAGOGASTRODUODENOSCOPY (EGD) WITH PROPOFOL N/A 07/07/2016   Procedure: ESOPHAGOGASTRODUODENOSCOPY (EGD) WITH PROPOFOL;  Surgeon: [name], MD;  Location: [facility];  Service: Endoscopy;  Laterality: N/A;  . ESOPHAGOGASTRODUODENOSCOPY (EGD) WITH PROPOFOL N/A 03/28/2019   Procedure: ESOPHAGOGASTRODUODENOSCOPY (EGD) WITH PROPOFOL;  Surgeon: [name], MD;  Location: [facility];  Service: Endoscopy;  Laterality: N/A;  . MAXIMUM ACCESS (MAS)POSTERIOR LUMBAR INTERBODY FUSION (PLIF)  1 LEVEL Left 06/10/2015   Procedure: FOR MAXIMUM ACCESS (MAS) POSTERIOR LUMBAR INTERBODY FUSION (PLIF) LUMBAR THREE-FOUR EXTRAFORAMINAL MICRODISCECTOMY LUMBAR FIVE-SACRAL ONE LEFT;  Surgeon: Eustace Moore, MD;  Location: [place] [facility];  Service: Neurosurgery;  Laterality: Left;  . TEE WITHOUT CARDIOVERSION N/A 01/28/2016   Procedure: TRANSESOPHAGEAL ECHOCARDIOGRAM (TEE);  Surgeon: Melrose Nakayama,  MD;  Location: [place];  Service: Open Heart Surgery;  Laterality: N/A;  . TUBAL LIGATION  1982   Dr Connye Burkitt  . UPPER GASTROINTESTINAL ENDOSCOPY    . VAGINAL HYSTERECTOMY  1997   Dr Rande Lawman    Allergies: Kiwi extract, Tdap [tetanus-diphth-acell pertussis], Statins, Latex, and Tramadol  Medications:  Current Facility-Administered Medications:  .  0.9 %  sodium chloride infusion (Manually program via Guardrails IV Fluids), , Intravenous, Once, Juanita Craver, MD .  0.9 %  sodium chloride infusion, , Intravenous, Continuous, Kayleen Memos, DO, Last Rate: 75 mL/hr at 04/07/19 1333 .  acetaminophen (TYLENOL) tablet 650 mg, 650 mg, Oral, Q6H PRN **OR** acetaminophen (TYLENOL) suppository 650 mg, 650 mg, Rectal, Q6H PRN, Pahwani, Rinka R, MD .  albumin human 25 % solution 50 g, 50 g, Intravenous, Q6H, Hall, Carole N, DO, Last Rate: 60 mL/hr at 04/07/19 0659, 50 g at 04/07/19 0659 .  Chlorhexidine Gluconate Cloth 2 % PADS 6 each, 6 each, Topical, Daily, Spero Geralds, MD, 6 each at 04/07/19 1334 .  fentaNYL (SUBLIMAZE) 100 MCG/2ML injection, , , ,  .  fentaNYL (SUBLIMAZE) injection 12.5 mcg, 12.5 mcg, Intravenous, Q6H PRN, Pahwani, Rinka R, MD, 12.5 mcg at 04/07/19 0414 .  insulin aspart (novoLOG) injection 0-15 Units, 0-15 Units, Subcutaneous, TID WC, Pahwani, Rinka R, MD, 3 Units at 04/07/19 0808 .  nadolol (CORGARD) tablet 20 mg, 20 mg, Oral, Daily, Hall, Carole N, DO .  octreotide (SANDOSTATIN) 500 mcg in sodium chloride 0.9 % 250 mL (2 mcg/mL) infusion, 50 mcg/hr, Intravenous, Continuous, Mann, Jyothi, MD, Last Rate: 25 mL/hr at 04/07/19 1150, 50 mcg/hr at 04/07/19 1150 .  ondansetron (ZOFRAN) tablet 4 mg, 4 mg, Oral, Q6H PRN **OR** ondansetron (ZOFRAN) injection 4 mg, 4 mg, Intravenous, Q6H PRN, Pahwani, Rinka R, MD .  pantoprazole (PROTONIX) EC tablet 40 mg, 40 mg, Oral, Daily, Pahwani, Rinka R, MD, 40 mg at 04/06/19 2132 .  sucralfate (CARAFATE) 1 GM/10ML suspension 1 g, 1 g, Oral, TID WC & HS,  Pahwani, Rinka R, MD, 1 g at 04/06/19 2132    Family History  Problem Relation Age of Onset  . Lung cancer Father   . Arthritis Sister   . Arthritis Brother   . Heart disease Maternal Grandmother   . Heart disease Maternal Grandfather   . Heart disease Paternal Grandmother   . Heart disease Paternal Grandfather   . Breast cancer Mother   . Liver cancer Brother   . Breast cancer Maternal Aunt   . Breast cancer Paternal Aunt   . Colon cancer Neg Hx   . Esophageal cancer Neg Hx   . Rectal cancer Neg Hx   . Stomach cancer Neg Hx     Social History   Socioeconomic History  . Marital status: Married    Spouse name: Not on file  . Number of children: 6  . Years of education: Not on file  . Highest education level: Not on file  Occupational History  . Occupation: retired  Scientific laboratory technician  . Financial resource strain: Not hard at all  . Food insecurity    Worry: Never  true    Inability: Never true  . Transportation needs    Medical: No    Non-medical: No  Tobacco Use  . Smoking status: Never Smoker  . Smokeless tobacco: Never Used  Substance and Sexual Activity  . Alcohol use: No  . Drug use: No  . Sexual activity: Yes    Partners: Male    Birth control/protection: Post-menopausal, Surgical    Comment: Hysterectomy  Lifestyle  . Physical activity    Days per week: 0 days    Minutes per session: 0 min  . Stress: Not at all  Relationships  . Social connections    Talks on phone: More than three times a week    Gets together: Twice a week    Attends religious service: More than 4 times per year    Active member of club or organization: No    Attends meetings of clubs or organizations: Never    Relationship status: Married  Other Topics Concern  . Not on file  Social History Narrative  . Not on file     Review of Systems: A 12 point ROS discussed and pertinent positives are indicated in the HPI above.  All other systems are negative.  Review of Systems  Vital  Signs: BP (!) 90/55   Pulse 89   Temp 97.9 F (36.6 C) (Oral)   Resp (!) 21   Ht 5' 3"  (1.6 m)   Wt 67.1 kg   SpO2 95%   BMI 26.22 kg/m   Physical Exam Constitutional:      General: She is not in acute distress.    Appearance: She is ill-appearing.  HENT:     Mouth/Throat:     Mouth: Mucous membranes are moist.     Pharynx: Oropharynx is clear.  Cardiovascular:     Rate and Rhythm: Normal rate and regular rhythm.     Pulses: Normal pulses.     Heart sounds: Normal heart sounds.  Abdominal:     General: Abdomen is flat.     Palpations: Abdomen is soft.     Comments: Mildly tender upper abd L>R  Skin:    General: Skin is warm and dry.     Coloration: Skin is not jaundiced.  Neurological:     Mental Status: She is alert.  Psychiatric:        Mood and Affect: Mood normal.        Thought Content: Thought content normal.        Judgment: Judgment normal.      Imaging: Ct Abdomen Pelvis W Contrast  Result Date: 04/06/2019 CLINICAL DATA:  Patient with vomiting, diarrhea and right upper quadrant pain. Status post esophageal band. EXAM: CT ABDOMEN AND PELVIS WITH CONTRAST TECHNIQUE: Multidetector CT imaging of the abdomen and pelvis was performed using the standard protocol following bolus administration of intravenous contrast. CONTRAST:  171m OMNIPAQUE IOHEXOL 300 MG/ML  SOLN COMPARISON:  MRI abdomen 04/07/2017 FINDINGS: Lower chest: Normal heart size. Dependent atelectasis within the bilateral lower lobes. No pleural effusion. Hepatobiliary: Morphologic changes to the liver compatible with cirrhosis. Heterogeneous attenuation of the liver. Mild gallbladder wall thickening, likely secondary to cirrhosis. No definite intrahepatic or extrahepatic biliary ductal dilatation. The portal vein is attenuated centrally (image 22; series 3) most compatible thrombus. Pancreas: Unremarkable Spleen: Enlarged and heterogeneous in attenuation. And like low-attenuation centrally. Splenic  infarct not entirely excluded. Adrenals/Urinary Tract: Normal adrenal glands. Kidneys enhance symmetrically with contrast. No hydronephrosis. Urinary bladder is unremarkable. Stomach/Bowel: There  is circumferential wall thickening of the cecum and ascending colon. No evidence for small bowel obstruction. Normal morphology of the stomach. Small amount of fluid within the small bowel mesentery. Paraesophageal varices. Vascular/Lymphatic: Normal caliber abdominal aorta. Peripheral calcified atherosclerotic plaque. No retroperitoneal lymphadenopathy. Reproductive: Status post hysterectomy. Other: Moderate volume ascites throughout the abdomen. Musculoskeletal: Lumbar spine degenerative changes. Lumbar spinal fusion hardware. No aggressive or acute appearing osseous lesions. IMPRESSION: 1. The portal vein is attenuated centrally most compatible with portal venous thrombus which appears to have progressed from prior exam. 2. There is circumferential wall thickening of the cecum and ascending colon which may be secondary to portal venous hypertension or colitis. 3. Morphologic changes to the liver compatible with cirrhosis. Heterogeneous attenuation of the liver which may be secondary to cirrhosis. 4. Bandlike heterogeneity of the spleen raising the possibility of splenic infarct. 5. Moderate volume ascites throughout the abdomen and pelvis. 6. Paraesophageal varices. Electronically Signed   By: Lovey Newcomer M.D.   On: 04/06/2019 15:56   Xr Chest Single View  Result Date: 04/01/2019 CLINICAL DATA:  Epigastric pain. Additional history provided: Patient reports being unable to swallow even small amounts. EXAM: PORTABLE CHEST 1 VIEW COMPARISON:  Fluoroscopic esophagram 03/18/2019, prior chest radiograph 03/29/2016 FINDINGS: Sequela prior median sternotomy/CABG. Heart size within normal limits. Aortic atherosclerosis No airspace consolidation No evidence of pleural effusion or pneumothorax. No acute bony abnormality.  Overlying cardiac monitoring leads. IMPRESSION: No airspace consolidation Electronically Signed   By: Kellie Simmering DO   On: 04/01/2019 14:07   US Abdomen Limited Ruq  Result Date: 03/18/2019 CLINICAL DATA:  Cirrhosis EXAM: ULTRASOUND ABDOMEN LIMITED RIGHT UPPER QUADRANT COMPARISON:  2019 FINDINGS: Gallbladder: Calculi measuring up to 9 mm. Mild wall thickening is again noted. No sonographic Murphy sign noted by sonographer. Common bile duct: Diameter: 4 mm Liver: No focal lesion identified. Heterogeneous echotexture likely related to known cirrhosis. Portal vein is patent on color Doppler imaging with normal direction of blood flow towards the liver. Other: Small volume perihepatic ascites. IMPRESSION: No hepatic lesion identified. Small volume perihepatic ascites. Cholelithiasis. Electronically Signed   By: Macy Mis M.D.   On: 03/18/2019 09:14   Dg Esophagus W Single Cm (sol Or Thin Ba)  Result Date: 03/18/2019 CLINICAL DATA:  Food and pills get stuck during swallowing. History of cirrhosis and distal paraesophageal varices. Most recent endoscopy 07/07/2016 EXAM: ESOPHOGRAM / BARIUM SWALLOW / BARIUM TABLET STUDY TECHNIQUE: Combined double contrast and single contrast examination performed using effervescent crystals, thick barium liquid, and thin barium liquid. The patient was observed with fluoroscopy swallowing a 13 mm barium sulphate tablet. FLUOROSCOPY TIME:  Fluoroscopy Time:  2.0 minutes Radiation Exposure Index (if provided by the fluoroscopic device): 11.8 mGy Number of Acquired Spot Images: 0 COMPARISON:  Report from 03/19/1999 FINDINGS: During the pharyngeal phase of swallowing there was laryngeal penetration to the level of the cords (image 1, series 2) Primary peristaltic waves in the esophagus were intact on 4/4 swallows. Large transient filling defects in the distal third of the esophagus are compatible with soft gel varices. Fold thickening in the distal esophagus suggesting  esophagitis. Reduced sensitivity for small sirs given the degree of para soft gel varices and resulting impressions/irregularity, but I do not see definite ulceration. No compelling mind findings of mass separate from the mass effect from the varices. A 13 mm barium tab tablet initially impacted in the distal esophagus but with additional swallows of contrast medium passed into the stomach. I do not  see a discrete stricture. IMPRESSION: 1. Prominent paraesophageal varices in the distal third of the esophagus. 2. Fold thickening in the distal esophagus suggesting esophagitis. I do not see definite ulceration or mass, although negative predictive value is adversely affected by the varices. 3. No significant dysmotility. 4. Single episode of laryngeal penetration to the level of the cords during the pharyngeal phase of swallowing. Electronically Signed   By: Van Clines M.D.   On: 03/18/2019 10:05    Labs:  CBC: Recent Labs    04/03/19 0359 04/06/19 1300 04/07/19 0350 04/07/19 1317  WBC 4.5 8.0 6.0 10.0  HGB 13.6 14.1 13.0 12.7  HCT 41.9 43.2 39.0 39.8  PLT 109* 117* 106* 98*    COAGS: Recent Labs    04/25/18 1453 03/11/19 0932 04/02/19 0405 04/07/19 0350  INR 1.1* 1.1* 1.2 1.2    BMP: Recent Labs    04/02/19 0405 04/03/19 0359 04/06/19 1300 04/07/19 0350  NA 135 135 133* 135  K 3.8 3.8 4.2 3.7  CL 105 107 100 104  CO2 20* 21* 23 22  GLUCOSE 152* 83 211* 181*  BUN 24* 15 11 12   CALCIUM 8.7* 8.8* 9.0 8.5*  CREATININE 0.93 0.77 0.88 0.90  GFRNONAA >60 >60 >60 >60  GFRAA >60 >60 >60 >60    LIVER FUNCTION TESTS: Recent Labs    04/01/19 1319 04/02/19 0405 04/06/19 1300 04/07/19 0350  BILITOT 1.0 1.1 1.4* 1.3*  AST 55* 36 46* 36  ALT 44 33 34 29  ALKPHOS 109 80 95 71  PROT 7.9 6.0* 7.3 6.0*  ALBUMIN 3.3* 2.6* 2.9* 2.4*    TUMOR MARKERS: Recent Labs    04/25/18 1453 03/11/19 0932  AFPTM 2.6 2.3    Assessment and Plan: Esophageal varices with  recurrent bleeding. Underlying NASH cirrhosis. Imaging and Labs reviewed. MELD-Na = 13 Thrombus noted in portal vein but not occlusive. Discussed with pt the role for TIPS procedure as means to assist bleeding control by decreasing pressure to the varices. Briefly explained the procedure including risks, complications, need for anesthesia, and post procedure follow up. At this time, she appears stable, but IR will be available if TIPS is felt necessary.    Thank you for this interesting consult.  I greatly enjoyed meeting [name] and look forward to participating in their care.  A copy of this report was sent to the requesting provider on this date.  Electronically Signed: Ascencion Dike, PA-C 04/07/2019, 2:40 PM   I spent a total of 25 minutes in face to face in clinical consultation, greater than 50% of which was counseling/coordinating care for bleeding varices/possible TIPS

## 2019-04-07 NOTE — Progress Notes (Addendum)
PROGRESS NOTE  [name]:[id number] DOB: [birth year] DOA: 04/06/2019 PCP: Gayland Curry, DO  HPI/Recap of past 24 hours: [name] is a 73 y.o. female with medical history significant of NAFLD with cirrhosis, esophageal varices status post band ligation on 11/19, type 2 diabetes mellitus, hypertension, hyperlipidemia, chronic thrombocytopenia presents to emergency department due to  abdominal pain-mostly right upper quadrant and epigastric, vomiting, diarrhea, decreased appetite since she discharged from the hospital on 04/03/2019.  Patient hospitalized from 04/01/2019 to 04/03/2019 due to intractable vomiting-no intervention done at that time. She discharged home on Viscous Lidocaine, hydrocodone, Protonix and Carafate. patient tells me that since her discharge from the hospital she continues to have abdominal pain, vomiting-nonbloody, nonbilious to many episodes to count, could not keep anything down, decreased appetite.  Denies hematemesis, melena, fever, chills, abdominal distention, confusion, chest pain, shortness of breath, leg swelling, cough, congestion.  She lives with her husband and denies smoking, alcohol, street drug use, over-the-counter use of NSAIDs, weight loss or night sweats.  ED Course: Upon arrival: BP soft, CBC shows chronic thrombocytopenia, CMP shows AST of 46, total bilirubin of 1.4, lipase: WNL, COVID-19 and UA pending.  CT abdomen/pelvis shows portal vein thrombosis, splenic infarct, cirrhosis with ascites and paraesophageal varices.  Patient received IV fluids, Zofran and fentanyl while she was in the ED.  EDP consulted GI for further evaluation and management.  04/07/19: Patient was seen and examined at bedside this morning.  She denies any abdominal pain after receiving fentanyl.  Seen by GI with plan for EGD this morning.   Assessment/Plan: Principal Problem:   Portal vein thrombosis Active Problems:   Hyperlipemia   DM (diabetes  mellitus) (HCC)   S/P CABG x 3   CAD (coronary artery disease)   Liver cirrhosis (HCC)   Thrombocytopenia (HCC)   Infarction of spleen  Portal vein thrombosis/possible splenic infarct: -Reviewed CT abdomen/pelvis result. Currently on IV fluids, IV Zofran and IV fentanyl for pain control as needed Seen by GI, plan for EGD this morning Hold off anticoagulation due to recent history of variceal bleeding.  NAFLD with cirrhosis and variceal bleeding: -s/p recent EGD with multiple esophageal banding x14-on 03/28/2019: -Patient denies hematemesis or melena. Continue Protonix, Carafate and nadolol Continue to hold Lasix, spironolactone due to soft blood pressures.  Continue to monitor H&H Plan EGD today by GI Management per GI  Type 2 diabetes mellitus with hyperglycemia: -She takes Jardiance, NovoLog and Levemir at home Continue sliding scale insulin and monitor blood sugar closely Hemoglobin A1c 8.4 on 04/01/2019.  Hyperlipidemia: On Zetia at home.  Continue to hold for now.  Essential hypertension with soft blood pressures:  Blood pressure is persistently soft Started IV fluids normal saline at 75 cc/h x 1 day and IV albumin 50 g x 2 doses. Start low sodium diet <2 grams per day when no longer NPO Maintain MAP greater than 65.  Chronic thrombocytopenia: Platelet count 106K Continue to monitor  Coronary artery disease status post CABG: Stable Denies anginal symptoms Continue to hold aspirin and Zetia for now.  Hyperbilirubinemia  T bili 1.3 GI following.  Chronic diastolic CHF Last 2D echo done in 2017 revealed preserved LVEF 60-65% Start strict I's and O's and daily weights   DVT prophylaxis: TED/SCD-no chemical prophylaxis due to history of variceal bleeding and recent band ligation  Code Status: Full code-confirmed with the patient Family Communication:  None at bedside.   Disposition Plan:  Patient is currently not appropriate for  discharge at this time  due to ongoing GI work-up and treatment of present condition.  Patient will require at least 2 midnights for further evaluation and treatment of current condition.   Consults called: GI Dr. Collene Mares by EDP   Objective: Vitals:   04/07/19 0751 04/07/19 0952 04/07/19 1010 04/07/19 1015  BP: (!) 106/58 (!) 103/40 (!) 177/115 (!) 191/105  Pulse: 76 77 87 80  Resp: 19 (!) 24 20 (!) 28  Temp: 98.3 F (36.8 C)     TempSrc: Oral     SpO2: 98% 100% 100% 97%  Weight:      Height:        Intake/Output Summary (Last 24 hours) at 04/07/2019 1152 Last data filed at 04/07/2019 1133 Gross per 24 hour  Intake 1050 ml  Output -  Net 1050 ml   Filed Weights   04/06/19 2109  Weight: 67.1 kg    Exam:  . General: 73 y.o. year-old female well developed well nourished in no acute distress.  Alert and oriented x3. . Cardiovascular: Regular rate and rhythm with no rubs or gallops.  No thyromegaly or JVD noted.   [name] Kitchen Respiratory: Clear to auscultation with no wheezes or rales. Good inspiratory effort. . Abdomen: Soft obese with normal bowel sounds x4 quadrants.  Right upper quadrant and right mid quadrant tender on palpation . Musculoskeletal: Trace lower extremity edema.  [name] Kitchen Psychiatry: Mood is appropriate for condition and setting   Data Reviewed: CBC: Recent Labs  Lab 04/01/19 1319 04/02/19 0405 04/03/19 0359 04/06/19 1300 04/07/19 0350  WBC 8.3 4.9 4.5 8.0 6.0  NEUTROABS  --   --  2.4  --   --   HGB 15.2* 13.1 13.6 14.1 13.0  HCT 47.4* 41.8 41.9 43.2 39.0  MCV 95.4 98.8 96.3 94.1 94.0  PLT 149* 105* 109* 117* 440*   Basic Metabolic Panel: Recent Labs  Lab 04/01/19 1319 04/02/19 0405 04/03/19 0359 04/06/19 1300 04/07/19 0350  NA 135 135 135 133* 135  K 4.8 3.8 3.8 4.2 3.7  CL 98 105 107 100 104  CO2 26 20* 21* 23 22  GLUCOSE 160* 152* 83 211* 181*  BUN 30* 24* 15 11 12   CREATININE 1.20* 0.93 0.77 0.88 0.90  CALCIUM 10.1 8.7* 8.8* 9.0 8.5*   GFR: Estimated Creatinine  Clearance: 52 mL/min (by C-G formula based on SCr of 0.9 mg/dL). Liver Function Tests: Recent Labs  Lab 04/01/19 1319 04/02/19 0405 04/06/19 1300 04/07/19 0350  AST 55* 36 46* 36  ALT 44 33 34 29  ALKPHOS 109 80 95 71  BILITOT 1.0 1.1 1.4* 1.3*  PROT 7.9 6.0* 7.3 6.0*  ALBUMIN 3.3* 2.6* 2.9* 2.4*   Recent Labs  Lab 04/01/19 1319 04/06/19 1300  LIPASE 20 23   No results for input(s): AMMONIA in the last 168 hours. Coagulation Profile: Recent Labs  Lab 04/02/19 0405 04/07/19 0350  INR 1.2 1.2   Cardiac Enzymes: No results for input(s): CKTOTAL, CKMB, CKMBINDEX, TROPONINI in the last 168 hours. BNP (last 3 results) No results for input(s): PROBNP in the last 8760 hours. HbA1C: No results for input(s): HGBA1C in the last 72 hours. CBG: Recent Labs  Lab 04/02/19 2159 04/03/19 0747 04/06/19 2120 04/07/19 0747 04/07/19 1141  GLUCAP 133* 97 177* 169* 170*   Lipid Profile: No results for input(s): CHOL, HDL, LDLCALC, TRIG, CHOLHDL, LDLDIRECT in the last 72 hours. Thyroid Function Tests: No results for input(s): TSH, T4TOTAL, FREET4, T3FREE, THYROIDAB in the last 72  hours. Anemia Panel: No results for input(s): VITAMINB12, FOLATE, FERRITIN, TIBC, IRON, RETICCTPCT in the last 72 hours. Urine analysis:    Component Value Date/Time   COLORURINE YELLOW 06/19/2017 [place] 06/19/2017 1228   LABSPEC 1.007 06/19/2017 1228   PHURINE 7.0 06/19/2017 1228   GLUCOSEU NEGATIVE 06/19/2017 1228   HGBUR TRACE (A) 06/19/2017 1228   BILIRUBINUR NEGATIVE 01/27/2016 2016   BILIRUBINUR neg 05/05/2012 1445   KETONESUR NEGATIVE 06/19/2017 1228   PROTEINUR NEGATIVE 06/19/2017 1228   UROBILINOGEN 0.2 05/05/2012 1445   UROBILINOGEN 0.2 12/18/2011 1409   NITRITE NEGATIVE 06/19/2017 1228   LEUKOCYTESUR 3+ (A) 06/19/2017 1228   Sepsis Labs: @LABRCNTIP (procalcitonin:4,lacticidven:4)  ) Recent Results (from the past 240 hour(s))  SARS CORONAVIRUS 2 (TAT 6-24 HRS)  Nasopharyngeal Nasopharyngeal Swab     Status: None   Collection Time: 04/01/19  3:29 PM   Specimen: Nasopharyngeal Swab  Result Value Ref Range Status   SARS Coronavirus 2 NEGATIVE NEGATIVE Final    Comment: (NOTE) SARS-CoV-2 target nucleic acids are NOT DETECTED. The SARS-CoV-2 RNA is generally detectable in upper and lower respiratory specimens during the acute phase of infection. Negative results do not preclude SARS-CoV-2 infection, do not rule out co-infections with other pathogens, and should not be used as the sole basis for treatment or other patient management decisions. Negative results must be combined with clinical observations, patient history, and epidemiological information. The expected result is Negative. Fact Sheet for Patients: SugarRoll.be Fact Sheet for Healthcare Providers: https://www.woods-mathews.com/ This test is not yet approved or cleared by the [place] FDA and  has been authorized for detection and/or diagnosis of SARS-CoV-2 by FDA under an Emergency Use Authorization (EUA). This EUA will remain  in effect (meaning this test can be used) for the duration of the COVID-19 declaration under Section 56 4(b)(1) of the Act, 21 U.S.C. section 360bbb-3(b)(1), unless the authorization is terminated or revoked sooner. Performed at [facility], [place] [address], [place], [place] [address]   SARS CORONAVIRUS 2 (TAT 6-24 HRS) Nasopharyngeal Nasopharyngeal Swab     Status: None   Collection Time: 04/06/19  8:09 PM   Specimen: Nasopharyngeal Swab  Result Value Ref Range Status   SARS Coronavirus 2 NEGATIVE NEGATIVE Final    Comment: (NOTE) SARS-CoV-2 target nucleic acids are NOT DETECTED. The SARS-CoV-2 RNA is generally detectable in upper and lower respiratory specimens during the acute phase of infection. Negative results do not preclude SARS-CoV-2 infection, do not rule out co-infections with other pathogens,  and should not be used as the sole basis for treatment or other patient management decisions. Negative results must be combined with clinical observations, patient history, and epidemiological information. The expected result is Negative. Fact Sheet for Patients: SugarRoll.be Fact Sheet for Healthcare Providers: https://www.woods-mathews.com/ This test is not yet approved or cleared by the [place] FDA and  has been authorized for detection and/or diagnosis of SARS-CoV-2 by FDA under an Emergency Use Authorization (EUA). This EUA will remain  in effect (meaning this test can be used) for the duration of the COVID-19 declaration under Section 56 4(b)(1) of the Act, 21 U.S.C. section 360bbb-3(b)(1), unless the authorization is terminated or revoked sooner. Performed at [facility], [place] [address], [place], [place] [address]       Studies: Ct Abdomen Pelvis W Contrast  Result Date: 04/06/2019 CLINICAL DATA:  Patient with vomiting, diarrhea and right upper quadrant pain. Status post esophageal band. EXAM: CT ABDOMEN AND PELVIS  WITH CONTRAST TECHNIQUE: Multidetector CT imaging of the abdomen and pelvis was performed using the standard protocol following bolus administration of intravenous contrast. CONTRAST:  14m OMNIPAQUE IOHEXOL 300 MG/ML  SOLN COMPARISON:  MRI abdomen 04/07/2017 FINDINGS: Lower chest: Normal heart size. Dependent atelectasis within the bilateral lower lobes. No pleural effusion. Hepatobiliary: Morphologic changes to the liver compatible with cirrhosis. Heterogeneous attenuation of the liver. Mild gallbladder wall thickening, likely secondary to cirrhosis. No definite intrahepatic or extrahepatic biliary ductal dilatation. The portal vein is attenuated centrally (image 22; series 3) most compatible thrombus. Pancreas: Unremarkable Spleen: Enlarged and heterogeneous in attenuation. And like low-attenuation centrally.  Splenic infarct not entirely excluded. Adrenals/Urinary Tract: Normal adrenal glands. Kidneys enhance symmetrically with contrast. No hydronephrosis. Urinary bladder is unremarkable. Stomach/Bowel: There is circumferential wall thickening of the cecum and ascending colon. No evidence for small bowel obstruction. Normal morphology of the stomach. Small amount of fluid within the small bowel mesentery. Paraesophageal varices. Vascular/Lymphatic: Normal caliber abdominal aorta. Peripheral calcified atherosclerotic plaque. No retroperitoneal lymphadenopathy. Reproductive: Status post hysterectomy. Other: Moderate volume ascites throughout the abdomen. Musculoskeletal: Lumbar spine degenerative changes. Lumbar spinal fusion hardware. No aggressive or acute appearing osseous lesions. IMPRESSION: 1. The portal vein is attenuated centrally most compatible with portal venous thrombus which appears to have progressed from prior exam. 2. There is circumferential wall thickening of the cecum and ascending colon which may be secondary to portal venous hypertension or colitis. 3. Morphologic changes to the liver compatible with cirrhosis. Heterogeneous attenuation of the liver which may be secondary to cirrhosis. 4. Bandlike heterogeneity of the spleen raising the possibility of splenic infarct. 5. Moderate volume ascites throughout the abdomen and pelvis. 6. Paraesophageal varices. Electronically Signed   By: DLovey NewcomerM.D.   On: 04/06/2019 15:56    Scheduled Meds: . sodium chloride   Intravenous Once  . [MAR Hold] insulin aspart  0-15 Units Subcutaneous TID WC  . [MAR Hold] insulin aspart  0-5 Units Subcutaneous QHS  . [MAR Hold] nadolol  20 mg Oral Daily  . [MAR Hold] pantoprazole  40 mg Oral Daily  . [MAR Hold] sucralfate  1 g Oral TID WC & HS    Continuous Infusions: . sodium chloride 75 mL/hr at 04/07/19 0721  . sodium chloride    . [MAR Hold] albumin human 50 g (04/07/19 0659)  . octreotide  (SANDOSTATIN)     IV infusion Stopped (04/07/19 1125)     LOS: 1 day     CKayleen Memos MD [facility] Pager [phone] If 7PM-7AM, please contact night-coverage [email or web address] Password [email or web address]11/29/2020, 11:52 AM

## 2019-04-07 NOTE — Consult Note (Signed)
NAME:  [name], MRN:  [id number], DOB:  [birth year], LOS: 1 ADMISSION DATE:  04/06/2019, CONSULTATION DATE:  04/07/2019  REFERRING MD:  Dr. Nevada Crane, CHIEF COMPLAINT:  GI Bleed   History of present illness   73 year old woman with a history of nonalcoholic fatty liver disease with cirrhosis.  She had a recent hospital stay for dysphagia and underwent an EGD on 11/19 with Dr. [name].  14 bands were placed on grade 3 esophageal varices.  Since her procedure she has had difficulty tolerating oral intake mostly due to dysphagia.  She went home on 11/25 and represented on 11/28 to the ED with worsening dysphagia.  Her last bowel movement was yesterday and it was nonbloody.  With concern for esophageal ulcerations, she was taken back to endoscopy for evaluation of her dysphagia and odynophagia.  Based on chart review and discussion with the gastroenterology team, there were multiple oozing varices that were injected with epinephrine and 2 additional clips were placed.  There was cessation of distal esophageal ulcer bleeding, as well as variceal bleeding.  Discussed with Dr. Collene Mares, due to her high risk for rebleeding from the varices, patient requires monitoring in intensive care unit.  Although it looks like 2 units of blood were ordered in the procedure, it does not appear that any were transfused.  She is complaining of some left-sided chest wall pain, as well as pain with swallowing.  Past Medical History  NAFLD, Cirrhosis with esophageal varices, CAD s/p CABG x3    Consults:  GI  Procedures:  11/29 EGD with banding of varices and injection of esophageal ulceration  Objective   Blood pressure (!) 93/57, pulse 89, temperature (!) 97 F (36.1 C), resp. rate (!) 23, height 5' 3"  (1.6 m), weight 67.1 kg, SpO2 96 %.        Intake/Output Summary (Last 24 hours) at 04/07/2019 1319 Last data filed at 04/07/2019 1133 Gross per 24 hour  Intake 1050 ml  Output -  Net 1050 ml   Filed Weights   04/06/19 2109  Weight: 67.1 kg    Examination: General: Resting comfortably, no distress HENT: mmm, dried blood in oropharynx Lungs: clear to auscultation, no increased work of breathing.  Cardiovascular: RRR, no mrg Abdomen: hypoactive bowel sounds, soft, diffusely ttp Extremities: no edema Neuro: normal speech, moves all 4 extremities MSK: no acute synovitis, no rashes Lines/Tubes: PIV  Assessment & Plan:  The patient is a 73 y.o. woman with a history of NAFLD with Grade 3 esophageal varies.   1.  Decompensated [place] cirrhosis with varices 2.  Acute blood loss anemia secondary to upper GI bleed from varices status post banding 11/29 3.  Portal venous thrombus 4. CAD s/p CABG 5. Type 2 DM   The patient will require monitoring in the ICU overnight to monitor for rebleeding.  She should be strictly n.p.o. no medications.  Ultimately TIPS would be ideal to manage her varices, but due to her portal venous thrombus this is likely not possible.  Once she is able to take p.o. would resume her nadolol for repeating prophylaxis.  We will closely monitor for signs of rebleeding and monitor H&H.  Continue octreotide. SSI.   Best practice:  Diet: Strict n.p.o., discussed with GI Pain/Anxiety/Delirium protocol (if indicated): No as needed VAP protocol (if indicated): n/a DVT prophylaxis: [facility] GI prophylaxis: on daily protonix Glucose control: yes, less than 180 Foley: none Mobility: OOB with assist Code Status: Full Disposition: needs ICU for now.  Labs   CBC: Recent Labs  Lab 04/01/19 1319 04/02/19 0405 04/03/19 0359 04/06/19 1300 04/07/19 0350  WBC 8.3 4.9 4.5 8.0 6.0  NEUTROABS  --   --  2.4  --   --   HGB 15.2* 13.1 13.6 14.1 13.0  HCT 47.4* 41.8 41.9 43.2 39.0  MCV 95.4 98.8 96.3 94.1 94.0  PLT 149* 105* 109* 117* 106*    Basic Metabolic Panel: Recent Labs  Lab 04/01/19 1319 04/02/19 0405 04/03/19 0359 04/06/19 1300 04/07/19 0350  NA 135 135 135 133* 135  K 4.8  3.8 3.8 4.2 3.7  CL 98 105 107 100 104  CO2 26 20* 21* 23 22  GLUCOSE 160* 152* 83 211* 181*  BUN 30* 24* 15 11 12   CREATININE 1.20* 0.93 0.77 0.88 0.90  CALCIUM 10.1 8.7* 8.8* 9.0 8.5*   GFR: Estimated Creatinine Clearance: 52 mL/min (by C-G formula based on SCr of 0.9 mg/dL). Recent Labs  Lab 04/01/19 1318  04/02/19 0405 04/03/19 0359 04/06/19 1300 04/07/19 0350  WBC  --    < > 4.9 4.5 8.0 6.0  LATICACIDVEN 1.9  --   --   --   --   --    < > = values in this interval not displayed.    Liver Function Tests: Recent Labs  Lab 04/01/19 1319 04/02/19 0405 04/06/19 1300 04/07/19 0350  AST 55* 36 46* 36  ALT 44 33 34 29  ALKPHOS 109 80 95 71  BILITOT 1.0 1.1 1.4* 1.3*  PROT 7.9 6.0* 7.3 6.0*  ALBUMIN 3.3* 2.6* 2.9* 2.4*   Recent Labs  Lab 04/01/19 1319 04/06/19 1300  LIPASE 20 23   No results for input(s): AMMONIA in the last 168 hours.  ABG    Component Value Date/Time   PHART 7.380 01/28/2016 1948   PCO2ART 33.9 01/28/2016 1948   PO2ART 77.0 (L) 01/28/2016 1948   HCO3 19.9 (L) 01/28/2016 1948   TCO2 24 01/29/2016 1839   ACIDBASEDEF 4.0 (H) 01/28/2016 1948   O2SAT 94.0 01/28/2016 1948     Coagulation Profile: Recent Labs  Lab 04/02/19 0405 04/07/19 0350  INR 1.2 1.2    Cardiac Enzymes: No results for input(s): CKTOTAL, CKMB, CKMBINDEX, TROPONINI in the last 168 hours.  HbA1C: Hgb A1c MFr Bld  Date/Time Value Ref Range Status  04/01/2019 01:19 PM 8.4 (H) 4.8 - 5.6 % Final    Comment:    (NOTE) Pre diabetes:          5.7%-6.4% Diabetes:              >6.4% Glycemic control for   <7.0% adults with diabetes   01/24/2019 09:07 AM 9.7 (H) <5.7 % of total Hgb Final    Comment:    For someone without known diabetes, a hemoglobin A1c value of 6.5% or greater indicates that they may have  diabetes and this should be confirmed with a follow-up  test. . For someone with known diabetes, a value <7% indicates  that their diabetes is well controlled  and a value  greater than or equal to 7% indicates suboptimal  control. A1c targets should be individualized based on  duration of diabetes, age, comorbid conditions, and  other considerations. . Currently, no consensus exists regarding use of hemoglobin A1c for diagnosis of diabetes for children. .     CBG: Recent Labs  Lab 04/02/19 2159 04/03/19 0747 04/06/19 2120 04/07/19 0747 04/07/19 1141  GLUCAP 133* 97 177* 169* 170*  Review of Systems:   Review of Systems  Constitutional: Negative for chills, fever and weight loss.  HENT: Negative for congestion, sinus pain and sore throat.   Eyes: Negative for discharge and redness.  Respiratory: Negative for cough, hemoptysis, sputum production, shortness of breath and wheezing.   Cardiovascular: Negative for chest pain, palpitations and leg swelling.  Gastrointestinal: Positive for abdominal pain, blood in stool, heartburn and nausea. Negative for vomiting.  Musculoskeletal: Negative for joint pain and myalgias.  Skin: Negative for rash.  Neurological: Negative for dizziness, tremors, focal weakness and headaches.  Endo/Heme/Allergies: Negative for environmental allergies.  Psychiatric/Behavioral: Negative for depression. The patient is not nervous/anxious.   All other systems reviewed and are negative.    Past Medical History  She,  has a past medical history of Allergy, Arthritis, Cataract, CHF (congestive heart failure) ([place]), Chronic kidney disease, stage I, Cirrhosis ([place]), Cramp of limb, Diabetes mellitus, Dysphagia, unspecified(787.20), Dysuria, Epistaxis, GERD (gastroesophageal reflux disease), Heart murmur, Lumbago, Neoplasm of uncertain behavior of skin, Nonspecific elevation of levels of transaminase or lactic acid dehydrogenase (LDH), Osteoarthrosis, unspecified whether generalized or localized, unspecified site, Other and unspecified hyperlipidemia, Pain in joint, shoulder region, Paresthesias (04/01/2015),  Postablative ovarian failure, Trochanteric bursitis of left hip (12/15/2015), Type 2 diabetes mellitus without complication ([place]), and Unspecified essential hypertension.   Surgical History    Past Surgical History:  Procedure Laterality Date  . BREAST BIOPSY    . CARDIAC CATHETERIZATION N/A 01/27/2016   Procedure: Left Heart Cath and Coronary Angiography;  Surgeon: Belva Crome, MD;  Location: [place] [facility];  Service: Cardiovascular;  Laterality: N/A;  . COLONOSCOPY  2012   [facility] - polyp  . COLONOSCOPY WITH PROPOFOL N/A 07/07/2016   Procedure: COLONOSCOPY WITH PROPOFOL;  Surgeon: [name], MD;  Location: [facility];  Service: Endoscopy;  Laterality: N/A;  . CORONARY ARTERY BYPASS GRAFT N/A 01/28/2016   Procedure: CORONARY ARTERY BYPASS GRAFTING (CABG) x 3 USING RIGHT LEG GREATER SAPHENOUS VEIN GRAFT;  Surgeon: Melrose Nakayama, MD;  Location: [place];  Service: Open Heart Surgery;  Laterality: N/A;  . ENDOVEIN HARVEST OF GREATER SAPHENOUS VEIN Right 01/28/2016   Procedure: ENDOVEIN HARVEST OF GREATER SAPHENOUS VEIN;  Surgeon: Melrose Nakayama, MD;  Location: [place];  Service: Open Heart Surgery;  Laterality: Right;  . ESOPHAGEAL BANDING  03/28/2019   Procedure: ESOPHAGEAL BANDING;  Surgeon: [name], MD;  Location: [facility];  Service: Endoscopy;;  . ESOPHAGOGASTRODUODENOSCOPY (EGD) WITH PROPOFOL N/A 07/07/2016   Procedure: ESOPHAGOGASTRODUODENOSCOPY (EGD) WITH PROPOFOL;  Surgeon: [name], MD;  Location: [facility];  Service: Endoscopy;  Laterality: N/A;  . ESOPHAGOGASTRODUODENOSCOPY (EGD) WITH PROPOFOL N/A 03/28/2019   Procedure: ESOPHAGOGASTRODUODENOSCOPY (EGD) WITH PROPOFOL;  Surgeon: [name], MD;  Location: [facility];  Service: Endoscopy;  Laterality: N/A;  . MAXIMUM ACCESS (MAS)POSTERIOR LUMBAR INTERBODY FUSION (PLIF) 1 LEVEL Left 06/10/2015   Procedure: FOR MAXIMUM ACCESS (MAS) POSTERIOR LUMBAR INTERBODY FUSION (PLIF) LUMBAR THREE-FOUR  EXTRAFORAMINAL MICRODISCECTOMY LUMBAR FIVE-SACRAL ONE LEFT;  Surgeon: Eustace Moore, MD;  Location: [place] [facility];  Service: Neurosurgery;  Laterality: Left;  . TEE WITHOUT CARDIOVERSION N/A 01/28/2016   Procedure: TRANSESOPHAGEAL ECHOCARDIOGRAM (TEE);  Surgeon: Melrose Nakayama, MD;  Location: [place];  Service: Open Heart Surgery;  Laterality: N/A;  . TUBAL LIGATION  1982   Dr Connye Burkitt  . UPPER GASTROINTESTINAL ENDOSCOPY    . VAGINAL HYSTERECTOMY  1997   Dr Rande Lawman     Social History  reports that she has never smoked. She has never used smokeless tobacco. She reports that she does not drink alcohol or use drugs.   Family History   Her family history includes Arthritis in her brother and sister; Breast cancer in her maternal aunt, mother, and paternal aunt; Heart disease in her maternal grandfather, maternal grandmother, paternal grandfather, and paternal grandmother; Liver cancer in her brother; Lung cancer in her father. There is no history of Colon cancer, Esophageal cancer, Rectal cancer, or Stomach cancer.   Allergies Allergies  Allergen Reactions  . Kiwi Extract Anaphylaxis  . Tdap [Tetanus-Diphth-Acell Pertussis] Swelling and Other (See Comments)    Swelling at injection site, gets very hot  . Statins     RHABDOMYOLYSIS  . Latex Itching, Dermatitis and Rash  . Tramadol Nausea And Vomiting     Home Medications  Prior to Admission medications   Medication Sig Start Date End Date Taking? Authorizing Provider  acetaminophen (TYLENOL) 500 MG tablet Take 500 mg by mouth at bedtime.    Yes [provider]  Aromatic Inhalants (VICKS VAPOR IN) Vicks Vapor Rub apply small amount to outside of nose to help breathing   Yes [provider]  aspirin EC 81 MG tablet Take 1 tablet (81 mg total) by mouth daily. 01/16/17  Yes Josue Hector, MD  Biotin 10000 MCG TABS Take 10,000 mcg by mouth every morning.   Yes [provider]  calcium carbonate (OS-CAL) 600 MG TABS  Take 600 mg by mouth 2 (two) times daily with a meal.     Yes [provider]  Cholecalciferol (VITAMIN D) 50 MCG (2000 UT) CAPS Take 2,000 Units by mouth daily.    Yes [provider]  Cyanocobalamin (VITAMIN B 12 PO) Take 1,000 mcg by mouth daily.     Yes [provider]  empagliflozin (JARDIANCE) 25 MG TABS tablet Take 25 mg by mouth daily. 01/31/19  Yes Reed, Tiffany L, DO  ezetimibe (ZETIA) 10 MG tablet TAKE 1 TABLET(10 MG) BY MOUTH DAILY Patient taking differently: Take 10 mg by mouth daily.  12/26/18  Yes Reed, Tiffany L, DO  furosemide (LASIX) 40 MG tablet TAKE 1 TABLET(40 MG) BY MOUTH DAILY Patient taking differently: Take 40 mg by mouth daily.  07/23/18  Yes Josue Hector, MD  insulin detemir (LEVEMIR) 100 UNIT/ML injection Inject 0.25 mLs (25 Units total) into the skin at bedtime. Decrease dose from 48 units to 25 units until diet further advanced.  Then may resume home dose. 04/03/19  Yes [place] Eric J, DO  lidocaine (XYLOCAINE) 2 % solution Use as directed 15 mLs in the mouth or throat 3 (three) times daily before meals for 14 days. 04/03/19 04/17/19 Yes [place] Eric J, DO  loratadine (CLARITIN) 10 MG tablet Take 10 mg by mouth daily as needed for allergies.   Yes [provider]  MAGNESIUM PO Take 500 mg by mouth 2 (two) times daily in the am and at bedtime..   Yes [provider]  Multiple Vitamins-Minerals (MULTIVITAMIN WITH MINERALS) tablet Take 1 tablet by mouth daily.     Yes [provider]  nadolol (CORGARD) 40 MG tablet Take 1 tablet (40 mg total) by mouth daily. 02/28/19  Yes Reed, Tiffany L, DO  NOVOLOG FLEXPEN 100 UNIT/ML FlexPen Inject 12 units under the skin every morning, 8 units at lunch and 12 units at supper Patient taking differently: Inject 8-12 Units into the skin See admin instructions. Inject 12 units under the  skin every morning, 8 units at lunch and 12 units at supper 02/19/19  Yes Reed, Tiffany L, DO   pantoprazole (PROTONIX) 40 MG tablet Take 1 tablet (40 mg total) by mouth daily. 04/03/19 06/02/19 Yes [place] Eric J, DO  Polyethyl Glycol-Propyl Glycol (SYSTANE OP) Place 1 drop into both eyes 2 (two) times daily.   Yes [provider]  potassium chloride SA (KLOR-CON) 20 MEQ tablet Take 1 tablet (20 mEq total) by mouth daily. 03/11/19  Yes Reed, Tiffany L, DO  Probiotic Product (PROBIOTIC DAILY PO) Take 1 capsule by mouth daily. Digestive Advantage Probiotic   Yes [provider]  spironolactone (ALDACTONE) 100 MG tablet TAKE 1 TABLET(100 MG) BY MOUTH TWICE DAILY Patient taking differently: Take 100 mg by mouth 2 (two) times daily.  09/10/18  Yes [name], MD  sucralfate (CARAFATE) 1 GM/10ML suspension Take 10 mLs (1 g total) by mouth 4 (four) times daily -  with meals and at bedtime for 14 days. 04/03/19 04/17/19 Yes [place] Donnamarie Poag, DO  BD PEN NEEDLE NANO U/F 32G X 4 MM MISC USE THREE TIMES DAILY AS DIRECTED 11/26/18   Reed, Tiffany L, DO  glucose blood test strip One Touch Ultra II strips. Use to test blood sugar three times daily. Dx: E11.65 06/01/17   Reed, Tiffany L, DO  HYDROcodone-acetaminophen (HYCET) 7.5-325 mg/15 ml solution Take 15 mLs by mouth every 6 (six) hours as needed for up to 10 days for moderate pain. Patient not taking: Reported on 04/06/2019 04/03/19 04/13/19  [place] Eric J, DO  Insulin Syringe-Needle U-100 (INSULIN SYRINGE 1CC/31GX5/16") 31G X 5/16" 1 ML MISC USE AS DIRECTED DAILY WITH LEVEMIR 07/27/18   Gayland Curry, DO     Critical care time   The patient is critically ill with multiple organ systems failure and requires high complexity decision making for assessment and support, frequent evaluation and titration of therapies, application of advanced monitoring technologies and extensive interpretation of multiple databases.   Critical Care Time devoted to patient care services described in this note is 35 minutes. This time reflects my personal  involvement in patient care. This critical care time does not reflect separately billable procedures or procedure time, teaching time or supervisory time of PA/NP/Med student/Med Resident etc but could involve care discussion time.  [facility] 04/07/2019 1:19 PM  Pager: [phone] After hours pager: [phone]

## 2019-04-08 ENCOUNTER — Inpatient Hospital Stay (HOSPITAL_COMMUNITY): Payer: Medicare Other | Admitting: Certified Registered Nurse Anesthetist

## 2019-04-08 ENCOUNTER — Encounter (HOSPITAL_COMMUNITY): Payer: Self-pay | Admitting: *Deleted

## 2019-04-08 ENCOUNTER — Encounter (HOSPITAL_COMMUNITY)
Admission: EM | Disposition: A | Discharge status: Home Health | Payer: Self-pay | Source: Home / Self Care | Attending: Internal Medicine

## 2019-04-08 ENCOUNTER — Inpatient Hospital Stay (HOSPITAL_COMMUNITY): Payer: Medicare Other

## 2019-04-08 DIAGNOSIS — I8501 Esophageal varices with bleeding: Secondary | ICD-10-CM | POA: Diagnosis not present

## 2019-04-08 DIAGNOSIS — K922 Gastrointestinal hemorrhage, unspecified: Secondary | ICD-10-CM | POA: Diagnosis not present

## 2019-04-08 DIAGNOSIS — K7460 Unspecified cirrhosis of liver: Secondary | ICD-10-CM

## 2019-04-08 DIAGNOSIS — R188 Other ascites: Secondary | ICD-10-CM

## 2019-04-08 HISTORY — PX: IR ANGIOGRAM SELECTIVE EACH ADDITIONAL VESSEL: IMG667

## 2019-04-08 HISTORY — PX: IR EMBO ART  VEN HEMORR LYMPH EXTRAV  INC GUIDE ROADMAPPING: IMG5450

## 2019-04-08 HISTORY — PX: RADIOLOGY WITH ANESTHESIA: SHX6223

## 2019-04-08 HISTORY — PX: IR PARACENTESIS: IMG2679

## 2019-04-08 HISTORY — PX: IR TIPS: IMG2295

## 2019-04-08 LAB — CBC
HCT: 34.1 % — ABNORMAL LOW (ref 36.0–46.0)
Hemoglobin: 11.1 g/dL — ABNORMAL LOW (ref 12.0–15.0)
MCH: 31.6 pg (ref 26.0–34.0)
MCHC: 32.6 g/dL (ref 30.0–36.0)
MCV: 97.2 fL (ref 80.0–100.0)
Platelets: 93 10*3/uL — ABNORMAL LOW (ref 150–400)
RBC: 3.51 MIL/uL — ABNORMAL LOW (ref 3.87–5.11)
RDW: 14.4 % (ref 11.5–15.5)
WBC: 5.2 10*3/uL (ref 4.0–10.5)
nRBC: 0 % (ref 0.0–0.2)

## 2019-04-08 LAB — POCT I-STAT, CHEM 8
BUN: 18 mg/dL (ref 8–23)
Calcium, Ion: 1.11 mmol/L — ABNORMAL LOW (ref 1.15–1.40)
Chloride: 105 mmol/L (ref 98–111)
Creatinine, Ser: 0.8 mg/dL (ref 0.44–1.00)
Glucose, Bld: 165 mg/dL — ABNORMAL HIGH (ref 70–99)
HCT: 40 % (ref 36.0–46.0)
Hemoglobin: 13.6 g/dL (ref 12.0–15.0)
Potassium: 5.4 mmol/L — ABNORMAL HIGH (ref 3.5–5.1)
Sodium: 138 mmol/L (ref 135–145)
TCO2: 25 mmol/L (ref 22–32)

## 2019-04-08 LAB — BASIC METABOLIC PANEL
Anion gap: 7 (ref 5–15)
BUN: 16 mg/dL (ref 8–23)
CO2: 24 mmol/L (ref 22–32)
Calcium: 8.8 mg/dL — ABNORMAL LOW (ref 8.9–10.3)
Chloride: 109 mmol/L (ref 98–111)
Creatinine, Ser: 1.09 mg/dL — ABNORMAL HIGH (ref 0.44–1.00)
GFR calc Af Amer: 59 mL/min — ABNORMAL LOW (ref >60)
GFR calc non Af Amer: 51 mL/min — ABNORMAL LOW (ref >60)
Glucose, Bld: 115 mg/dL — ABNORMAL HIGH (ref 70–99)
Potassium: 3.9 mmol/L (ref 3.5–5.1)
Sodium: 140 mmol/L (ref 135–145)

## 2019-04-08 LAB — GLUCOSE, CAPILLARY
Glucose-Capillary: 106 mg/dL — ABNORMAL HIGH (ref 70–99)
Glucose-Capillary: 126 mg/dL — ABNORMAL HIGH (ref 70–99)
Glucose-Capillary: 128 mg/dL — ABNORMAL HIGH (ref 70–99)
Glucose-Capillary: 135 mg/dL — ABNORMAL HIGH (ref 70–99)
Glucose-Capillary: 143 mg/dL — ABNORMAL HIGH (ref 70–99)
Glucose-Capillary: 147 mg/dL — ABNORMAL HIGH (ref 70–99)

## 2019-04-08 LAB — HEMOGLOBIN AND HEMATOCRIT, BLOOD
HCT: 34.5 % — ABNORMAL LOW (ref 36.0–46.0)
HCT: 42 % (ref 36.0–46.0)
Hemoglobin: 11.5 g/dL — ABNORMAL LOW (ref 12.0–15.0)
Hemoglobin: 13.7 g/dL (ref 12.0–15.0)

## 2019-04-08 SURGERY — RADIOLOGY WITH ANESTHESIA
Anesthesia: General

## 2019-04-08 MED ORDER — SUCCINYLCHOLINE CHLORIDE 200 MG/10ML IV SOSY
PREFILLED_SYRINGE | INTRAVENOUS | Status: DC | PRN
  Administered 2019-04-08: 100 mg via INTRAVENOUS

## 2019-04-08 MED ORDER — IOHEXOL 300 MG/ML  SOLN
150.0000 mL | Freq: Once | INTRAMUSCULAR | Status: AC | PRN
  Administered 2019-04-08: 20:00:00 75 mL via INTRAVENOUS

## 2019-04-08 MED ORDER — ROCURONIUM BROMIDE 10 MG/ML (PF) SYRINGE
PREFILLED_SYRINGE | INTRAVENOUS | Status: DC | PRN
  Administered 2019-04-08 (×2): 20 mg via INTRAVENOUS
  Administered 2019-04-08: 40 mg via INTRAVENOUS
  Administered 2019-04-08: 20 mg via INTRAVENOUS

## 2019-04-08 MED ORDER — IOHEXOL 300 MG/ML  SOLN
150.0000 mL | Freq: Once | INTRAMUSCULAR | Status: AC | PRN
  Administered 2019-04-08: 20:00:00 35 mL via INTRAVENOUS

## 2019-04-08 MED ORDER — MEPERIDINE HCL 25 MG/ML IJ SOLN: 6.2500 mg | INTRAMUSCULAR | Status: DC | PRN

## 2019-04-08 MED ORDER — PROPOFOL 10 MG/ML IV BOLUS
INTRAVENOUS | Status: DC | PRN
  Administered 2019-04-08 (×3): 50 mg via INTRAVENOUS

## 2019-04-08 MED ORDER — SODIUM CHLORIDE 0.9 % IV SOLN
INTRAVENOUS | Status: DC | PRN
  Administered 2019-04-08: 15:00:00 via INTRAVENOUS

## 2019-04-08 MED ORDER — SUGAMMADEX SODIUM 200 MG/2ML IV SOLN
INTRAVENOUS | Status: DC | PRN
  Administered 2019-04-08: 300 mg via INTRAVENOUS

## 2019-04-08 MED ORDER — ONDANSETRON HCL 4 MG/2ML IJ SOLN: 4.0000 mg | Freq: Once | INTRAMUSCULAR | Status: DC | PRN

## 2019-04-08 MED ORDER — ONDANSETRON HCL 4 MG/2ML IJ SOLN
INTRAMUSCULAR | Status: DC | PRN
  Administered 2019-04-08: 4 mg via INTRAVENOUS

## 2019-04-08 MED ORDER — FENTANYL CITRATE (PF) 100 MCG/2ML IJ SOLN: 25.0000 ug | INTRAMUSCULAR | Status: DC | PRN

## 2019-04-08 MED ORDER — LACTATED RINGERS IV SOLN
INTRAVENOUS | Status: DC | PRN
  Administered 2019-04-08: 15:00:00 via INTRAVENOUS

## 2019-04-08 MED ORDER — GELATIN ABSORBABLE 12-7 MM EX MISC
CUTANEOUS | Status: AC
  Filled 2019-04-08: qty 1

## 2019-04-08 MED ORDER — FENTANYL CITRATE (PF) 100 MCG/2ML IJ SOLN
INTRAMUSCULAR | Status: AC
  Filled 2019-04-08: qty 4

## 2019-04-08 MED ORDER — LIDOCAINE 2% (20 MG/ML) 5 ML SYRINGE
INTRAMUSCULAR | Status: DC | PRN
  Administered 2019-04-08: 60 mg via INTRAVENOUS

## 2019-04-08 MED ORDER — DEXAMETHASONE SODIUM PHOSPHATE 10 MG/ML IJ SOLN
INTRAMUSCULAR | Status: DC | PRN
  Administered 2019-04-08: 4 mg via INTRAVENOUS

## 2019-04-08 NOTE — Anesthesia Procedure Notes (Signed)
Procedure Name: Intubation Date/Time: 04/08/2019 3:57 PM Performed by: Teressa Lower., CRNA Pre-anesthesia Checklist: Patient identified, Emergency Drugs available, Suction available and Patient being monitored Patient Re-evaluated:Patient Re-evaluated prior to induction Oxygen Delivery Method: Circle system utilized Preoxygenation: Pre-oxygenation with 100% oxygen Induction Type: IV induction and Rapid sequence Laryngoscope Size: Mac and 3 Grade View: Grade I Tube type: Oral Tube size: 7.0 mm Number of attempts: 1 Airway Equipment and Method: Stylet Placement Confirmation: ETT inserted through vocal cords under direct vision,  positive ETCO2 and breath sounds checked- equal and bilateral Secured at: 22 cm Tube secured with: Tape Dental Injury: Teeth and Oropharynx as per pre-operative assessment

## 2019-04-08 NOTE — Anesthesia Preprocedure Evaluation (Signed)
Anesthesia Evaluation  Patient identified by MRN, date of birth, ID band Patient awake    Reviewed: Allergy & Precautions, NPO status , Patient's Chart, lab work & pertinent test results  Airway Mallampati: II   Neck ROM: Limited    Dental   Pulmonary     + decreased breath sounds      Cardiovascular hypertension, + angina + CAD and +CHF  + Valvular Problems/Murmurs  Rhythm:Regular Rate:Tachycardia     Neuro/Psych    GI/Hepatic GERD  ,  Endo/Other  diabetes  Renal/GU Renal disease     Musculoskeletal  (+) Arthritis ,   Abdominal   Peds  Hematology  (+) anemia ,   Anesthesia Other Findings   Reproductive/Obstetrics                             Anesthesia Physical  Anesthesia Plan  ASA: IV  Anesthesia Plan: General   Post-op Pain Management:    Induction: Intravenous, Rapid sequence and Cricoid pressure planned  PONV Risk Score and Plan: 3 and Ondansetron and Treatment may vary due to age or medical condition  Airway Management Planned: Oral ETT  Additional Equipment: Arterial line  Intra-op Plan:   Post-operative Plan: Possible Post-op intubation/ventilation  Informed Consent: I have reviewed the patients History and Physical, chart, labs and discussed the procedure including the risks, benefits and alternatives for the proposed anesthesia with the patient or authorized representative who has indicated his/her understanding and acceptance.     Dental advisory given  Plan Discussed with: CRNA  Anesthesia Plan Comments:         Anesthesia Quick Evaluation

## 2019-04-08 NOTE — Progress Notes (Signed)
Daily Rounding Note  04/08/2019, 8:37 AM  LOS: 2 days   SUBJECTIVE:   Chief complaint: bleeding esophageal ulcers Stool is bloody.  Chest pain is better but persists and worse with valsalva, not worse w palpation.  No nausea.  Not hungry  OBJECTIVE:         Vital signs in last 24 hours:    Temp:  [97 F (36.1 C)-98.9 F (37.2 C)] 98 F (36.7 C) (11/30 0807) Pulse Rate:  [70-91] 77 (11/30 0800) Resp:  [14-31] 17 (11/30 0800) BP: (83-191)/(40-115) 101/62 (11/30 0800) SpO2:  [92 %-100 %] 96 % (11/30 0800) Weight:  [64.4 kg] 64.4 kg (11/30 0500) Last BM Date: 04/08/19 Filed Weights   04/06/19 2109 04/08/19 0500  Weight: 67.1 kg 64.4 kg   General: tanned, ill looking but not critically ill.     Heart: RRR Chest: clear bil.  C/o of lower sternal, epigastric pain afte she sat up in bed Abdomen: soft, NT, ND.  Active BS.  No obvious ascites  Extremities: no CCE Neuro/Psych:  Oriented x 3.  No asterixis or tremors.    Intake/Output from previous day: 11/29 0701 - 11/30 0700 In: 3644.2 [I.V.:2764.9; IV Piggyback:879.3] Out: 200 [Urine:200]  Intake/Output this shift: Total I/O In: 124.9 [I.V.:124.9] Out: 50 [Urine:50]  Lab Results: Recent Labs    04/07/19 0350 04/07/19 1317 04/08/19 0608  WBC 6.0 10.0 5.2  HGB 13.0 12.7 11.1*  HCT 39.0 39.8 34.1*  PLT 106* 98* 93*   BMET Recent Labs    04/06/19 1300 04/07/19 0350 04/08/19 0608  NA 133* 135 140  K 4.2 3.7 3.9  CL 100 104 109  CO2 23 22 24   GLUCOSE 211* 181* 115*  BUN 11 12 16   CREATININE 0.88 0.90 1.09*  CALCIUM 9.0 8.5* 8.8*   LFT Recent Labs    04/06/19 1300 04/07/19 0350  PROT 7.3 6.0*  ALBUMIN 2.9* 2.4*  AST 46* 36  ALT 34 29  ALKPHOS 95 71  BILITOT 1.4* 1.3*   PT/INR Recent Labs    04/07/19 0350  LABPROT 15.4*  INR 1.2   Hepatitis Panel No results for input(s): HEPBSAG, HCVAB, HEPAIGM, HEPBIGM in the last 72  hours.  Studies/Results: Ct Abdomen Pelvis W Contrast  Result Date: 04/06/2019 CLINICAL DATA:  Patient with vomiting, diarrhea and right upper quadrant pain. Status post esophageal band. EXAM: CT ABDOMEN AND PELVIS WITH CONTRAST TECHNIQUE: Multidetector CT imaging of the abdomen and pelvis was performed using the standard protocol following bolus administration of intravenous contrast. CONTRAST:  154m OMNIPAQUE IOHEXOL 300 MG/ML  SOLN COMPARISON:  MRI abdomen 04/07/2017 FINDINGS: Lower chest: Normal heart size. Dependent atelectasis within the bilateral lower lobes. No pleural effusion. Hepatobiliary: Morphologic changes to the liver compatible with cirrhosis. Heterogeneous attenuation of the liver. Mild gallbladder wall thickening, likely secondary to cirrhosis. No definite intrahepatic or extrahepatic biliary ductal dilatation. The portal vein is attenuated centrally (image 22; series 3) most compatible thrombus. Pancreas: Unremarkable Spleen: Enlarged and heterogeneous in attenuation. And like low-attenuation centrally. Splenic infarct not entirely excluded. Adrenals/Urinary Tract: Normal adrenal glands. Kidneys enhance symmetrically with contrast. No hydronephrosis. Urinary bladder is unremarkable. Stomach/Bowel: There is circumferential wall thickening of the cecum and ascending colon. No evidence for small bowel obstruction. Normal morphology of the stomach. Small amount of fluid within the small bowel mesentery. Paraesophageal varices. Vascular/Lymphatic: Normal caliber abdominal aorta. Peripheral calcified atherosclerotic plaque. No retroperitoneal lymphadenopathy. Reproductive: Status post hysterectomy. Other: Moderate  volume ascites throughout the abdomen. Musculoskeletal: Lumbar spine degenerative changes. Lumbar spinal fusion hardware. No aggressive or acute appearing osseous lesions. IMPRESSION: 1. The portal vein is attenuated centrally most compatible with portal venous thrombus which appears  to have progressed from prior exam. 2. There is circumferential wall thickening of the cecum and ascending colon which may be secondary to portal venous hypertension or colitis. 3. Morphologic changes to the liver compatible with cirrhosis. Heterogeneous attenuation of the liver which may be secondary to cirrhosis. 4. Bandlike heterogeneity of the spleen raising the possibility of splenic infarct. 5. Moderate volume ascites throughout the abdomen and pelvis. 6. Paraesophageal varices. Electronically Signed   By: Lovey Newcomer M.D.   On: 04/06/2019 15:56   Scheduled Meds:  sodium chloride   Intravenous Once   Chlorhexidine Gluconate Cloth  6 each Topical Daily   insulin aspart  0-15 Units Subcutaneous Q4H   nadolol  20 mg Oral Daily   [START ON 04/11/2019] pantoprazole  40 mg Intravenous Q12H   sucralfate  1 g Oral TID WC & HS   Continuous Infusions:  sodium chloride 75 mL/hr at 04/08/19 0900   octreotide  (SANDOSTATIN)    IV infusion 50 mcg/hr (04/08/19 0900)   pantoprozole (PROTONIX) infusion 8 mg/hr (04/08/19 0900)   PRN Meds:.acetaminophen **OR** acetaminophen, fentaNYL (SUBLIMAZE) injection, ondansetron **OR** ondansetron (ZOFRAN) IV   ASSESMENT:   *    Retrosternal pain, nausea, vomiting.  Previous EGD with banding of esophageal varices x14 on 03/28/2019. 11/29 EGD: Multiple esophageal ulcers, one actively bleeding was injected with epinephrine and 2 endoclips placed.  Esophageal varix at 35 cm with ulcer, stigmata of recent bleeding which was banded x 1.  CT scan 04/07/2019 shows nonocclusive thrombus at the confluence of SMV and splenic vein extending into main portal vein.  This will make successful TIPS placement and pressure gradient reduction challenging.  Radiology, Dr. Kathlene Cote, following.  For now, with cessation of bleeding, no plans for TIPS.    *    NASH cirrhosis.  *    Mild, normocytic anemia.  *     Thrombocytopenia, platelets 93.   PLAN   *  Continue  observation for rebleeding.  NPO.  Octreotide and PPI drips.  Sucralfate.        Azucena Freed  04/08/2019, 8:37 AM Phone [phone]

## 2019-04-08 NOTE — Anesthesia Postprocedure Evaluation (Signed)
Anesthesia Post Note  Patient: [name]  Procedure(s) Performed: RADIOLOGY WITH ANESTHESIA (N/A )     Patient location during evaluation: PACU Anesthesia Type: General Level of consciousness: awake and alert Pain management: pain level controlled Vital Signs Assessment: post-procedure vital signs reviewed and stable Respiratory status: spontaneous breathing, nonlabored ventilation, respiratory function stable and patient connected to nasal cannula oxygen Cardiovascular status: blood pressure returned to baseline and stable Postop Assessment: no apparent nausea or vomiting Anesthetic complications: no    Last Vitals:  Vitals:   04/08/19 2008 04/08/19 2017  BP: 119/64   Pulse: 81 79  Resp: 20 17  Temp:  36.8 C  SpO2: 98% 96%    Last Pain:  Vitals:   04/08/19 2017  TempSrc:   PainSc: 0-No pain                 Trinity Hyland DAVID

## 2019-04-08 NOTE — Transfer of Care (Signed)
Immediate Anesthesia Transfer of Care Note  Patient: [name]  Procedure(s) Performed: RADIOLOGY WITH ANESTHESIA (N/A )  Patient Location: PACU  Anesthesia Type:General  Level of Consciousness: awake, alert  and oriented  Airway & Oxygen Therapy: Patient Spontanous Breathing and Patient connected to face mask oxygen  Post-op Assessment: Report given to RN and Post -op Vital signs reviewed and stable  Post vital signs: Reviewed and stable  Last Vitals:  Vitals Value Taken Time  BP 130/61 04/08/19 1953  Temp 36.4 C 04/08/19 1953  Pulse 83 04/08/19 1958  Resp 17 04/08/19 1958  SpO2 98 % 04/08/19 1958  Vitals shown include unvalidated device data.  Last Pain:  Vitals:   04/08/19 1147  TempSrc: Oral  PainSc:       Patients Stated Pain Goal: 0 (94/[id number])  Complications: No apparent anesthesia complications

## 2019-04-08 NOTE — Progress Notes (Signed)
D/W Dr Doristine Bosworth. [facility] will assume care 12/1.  PCCM will sign off effective same. Thank you for the opportunity to participate in this patient's care. Please contact if we can be of further assistance. Francine Graven, MSN, AGACNP  [facility]

## 2019-04-08 NOTE — Progress Notes (Addendum)
NAME:  [name], MRN:  [id number], DOB:  1946/04/09, LOS: 2 ADMISSION DATE:  04/06/2019, CONSULTATION DATE:  04/08/2019  REFERRING MD:  Dr. Nevada Crane, CHIEF COMPLAINT:  GI Bleed   History of present illness   73 year old woman with a history of nonalcoholic fatty liver disease with cirrhosis.  She had a recent hospital stay for dysphagia and underwent an EGD on 11/19 with Dr. [name].  14 bands were placed on grade 3 esophageal varices.  Since her procedure she has had difficulty tolerating oral intake mostly due to dysphagia.  She went home on 11/25 and represented on 11/28 to the ED with worsening dysphagia.  Her last bowel movement was yesterday and it was nonbloody.  With concern for esophageal ulcerations, she was taken back to endoscopy for evaluation of her dysphagia and odynophagia.  Based on chart review and discussion with the gastroenterology team, there were multiple oozing varices that were injected with epinephrine and 2 additional clips were placed.  There was cessation of distal esophageal ulcer bleeding, as well as variceal bleeding.  Discussed with Dr. Collene Mares, due to her high risk for rebleeding from the varices, patient requires monitoring in intensive care unit.  Although it looks like 2 units of blood were ordered in the procedure, it does not appear that any were transfused.  She is complaining of some left-sided chest wall pain, as well as pain with swallowing.  Past Medical History  NAFLD, Cirrhosis with esophageal varices, CAD s/p CABG x3    Consults:  GI  Procedures:  11/29 EGD with banding of varices and injection of esophageal ulceration  Objective   Blood pressure 101/62, pulse 77, temperature 98 F (36.7 C), temperature source Oral, resp. rate 17, height 5' 3"  (1.6 m), weight 64.4 kg, SpO2 96 %.        Intake/Output Summary (Last 24 hours) at 04/08/2019 0845 Last data filed at 04/08/2019 0800 Gross per 24 hour  Intake 3769.05 ml  Output 250 ml  Net 3519.05  ml   Filed Weights   04/06/19 2109 04/08/19 0500  Weight: 67.1 kg 64.4 kg    Examination: General: Thin, chronically ill appearing, NAD  HENT: Normocephalic, PERRL. Dry mucus membranes Neck: No JVD. Trachea midline.  CV: RRR. S1S2. No MRG. +2 distal pulses Lungs: BBS present, clear, FNL, symmetrical ABD: +BS x4. SNT/ND. No masses, guarding or rigidity GU: No Foley. Melena per BSC  EXT: MAE well. No edema Skin: PWD. In tact. No rashes or lesions Neuro: A&Ox3. CN II-XII in tact. No focal deficits Psych: Appropriate mood, insight and judgment for time and situation     Assessment & Plan:  The patient is a 73 y.o. woman with a history of NAFLD with Grade 3 esophageal varies.   1.  Decompensated Nash cirrhosis with varices 2.  Acute blood loss anemia secondary to upper GI bleed from varices status post banding 11/29. H&H/VS stable. Pt continues to have melena.  3.  Portal venous thrombus 4. CAD s/p CABG 5. Type 2 DM 6. Thrombocytopenia    -continue octreotide, PPI, nadolol, sucralfate, NPO per GI. IR consulted for possible TIPS 12/1 -H&H stable. Q6 H&H starting at 1200. Am CBC. -follow BMP  -SSI   Best practice:  Diet: Strict n.p.o., discussed with GI Pain/Anxiety/Delirium protocol (if indicated): No as needed VAP protocol (if indicated): n/a DVT prophylaxis: SCD only d/t bleeding  GI prophylaxis: on protonix gtt  Glucose control: yes, less than 180 Foley: none Mobility: OOB with assist Code  Status: Full Disposition: transfer to progressive care   Labs   CBC: Recent Labs  Lab 04/03/19 0359 04/06/19 1300 04/07/19 0350 04/07/19 1317 04/08/19 0608  WBC 4.5 8.0 6.0 10.0 5.2  NEUTROABS 2.4  --   --   --   --   HGB 13.6 14.1 13.0 12.7 11.1*  HCT 41.9 43.2 39.0 39.8 34.1*  MCV 96.3 94.1 94.0 99.0 97.2  PLT 109* 117* 106* 98* 93*    Basic Metabolic Panel: Recent Labs  Lab 04/02/19 0405 04/03/19 0359 04/06/19 1300 04/07/19 0350 04/08/19 0608  NA 135 135  133* 135 140  K 3.8 3.8 4.2 3.7 3.9  CL 105 107 100 104 109  CO2 20* 21* 23 22 24   GLUCOSE 152* 83 211* 181* 115*  BUN 24* 15 11 12 16   CREATININE 0.93 0.77 0.88 0.90 1.09*  CALCIUM 8.7* 8.8* 9.0 8.5* 8.8*    Liver Function Tests: Recent Labs  Lab 04/01/19 1319 04/02/19 0405 04/06/19 1300 04/07/19 0350  AST 55* 36 46* 36  ALT 44 33 34 29  ALKPHOS 109 80 95 71  BILITOT 1.0 1.1 1.4* 1.3*  PROT 7.9 6.0* 7.3 6.0*  ALBUMIN 3.3* 2.6* 2.9* 2.4*    Coagulation Profile: Recent Labs  Lab 04/02/19 0405 04/07/19 0350  INR 1.2 1.2     CBG: Recent Labs  Lab 04/07/19 1706 04/07/19 1935 04/08/19 0007 04/08/19 0358 04/08/19 0805  GLUCAP 172* 2* 87* 128* 126*    Francine Graven, MSN, AGACNP  [facility]   GI input appreciated Upper GI bleed from varices status post banding 11/19 and again 86/57 Nonalcoholic cirrhosis -portal venous thrombosis makes TIPS procedure challenging but due to high risk of rebleeding, plan is to proceed.  We will keep her in ICU on octreotide and PPI drip until this procedure is done.  She can then transferred to telemetry if hemoglobin remains stable  Aaren Krog V. Elsworth Soho MD

## 2019-04-08 NOTE — Procedures (Signed)
  Procedure: 1. TIPS   2. Paracentesis   3. Embolization esophageal varices   EBL:   minimal Complications:  none immediate  See full dictation in [facility].  Dillard Cannon MD Main # [phone] Pager  [phone]

## 2019-04-08 NOTE — Anesthesia Procedure Notes (Signed)
Arterial Line Insertion Start/End11/30/2020 1:55 AM, 04/08/2019 2:01 PM Performed by: Teressa Lower., CRNA, CRNA  Patient location: Pre-op. Preanesthetic checklist: patient identified, IV checked, site marked, risks and benefits discussed, surgical consent, monitors and equipment checked, pre-op evaluation, timeout performed and anesthesia consent Lidocaine 1% used for infiltration Left, radial was placed Catheter size: 20 G Hand hygiene performed , maximum sterile barriers used  and Seldinger technique used Allen's test indicative of satisfactory collateral circulation Attempts: 1 Procedure performed without using ultrasound guided technique. Following insertion, dressing applied and Biopatch. Post procedure assessment: normal and unchanged  Patient tolerated the procedure well with no immediate complications.

## 2019-04-08 NOTE — Progress Notes (Signed)
Made Dr. Emmit Alexanders aware via Loraine Maple RN that pt is having hypertension post TIPS, history of hypertension noted.  Awaiting new order.

## 2019-04-08 NOTE — Progress Notes (Signed)
Pre TIPS: Right atrium pressure = 21(mean)  Portal vein pressure = 36 (mean)

## 2019-04-08 NOTE — Progress Notes (Signed)
Anesthesia present for case. Franchot Mimes, CRNA

## 2019-04-08 NOTE — Progress Notes (Signed)
Referring Physician(s): Dr. Havery Moros  Supervising Physician: Arne Cleveland  Patient Status:  [name] - In-pt  Chief Complaint: Follow up TIPS consult  Subjective:  [name] is sitting up in bed, husband and RN at bedside. She states the TIPS was discussed with her briefly yesterday but would like to have it explained again to refresh her memory as she just came out of having an EGD. She denies any issues with encephalopathy in the past. She denies any hemoptysis, nausea, vomiting, abdominal pain or abdominal distention. She has not eaten in "over a week" - per RN she has only had a small cup of carafate this morning. After through discussion of the procedure indications, risks, benefits and alternatives patient and her husband agree to proceed with TIPS placement.   Allergies: Kiwi extract, Tdap [tetanus-diphth-acell pertussis], Statins, Latex, and Tramadol  Medications: Prior to Admission medications   Medication Sig Start Date End Date Taking? Authorizing Provider  acetaminophen (TYLENOL) 500 MG tablet Take 500 mg by mouth at bedtime.    Yes [provider]  Aromatic Inhalants (VICKS VAPOR IN) Vicks Vapor Rub apply small amount to outside of nose to help breathing   Yes [provider]  aspirin EC 81 MG tablet Take 1 tablet (81 mg total) by mouth daily. 01/16/17  Yes Josue Hector, MD  Biotin 10000 MCG TABS Take 10,000 mcg by mouth every morning.   Yes [provider]  calcium carbonate (OS-CAL) 600 MG TABS Take 600 mg by mouth 2 (two) times daily with a meal.     Yes [provider]  Cholecalciferol (VITAMIN D) 50 MCG (2000 UT) CAPS Take 2,000 Units by mouth daily.    Yes [provider]  Cyanocobalamin (VITAMIN B 12 PO) Take 1,000 mcg by mouth daily.     Yes [provider]  empagliflozin (JARDIANCE) 25 MG TABS tablet Take 25 mg by mouth daily. 01/31/19  Yes Reed, Tiffany L, DO  ezetimibe (ZETIA) 10 MG tablet TAKE 1 TABLET(10  MG) BY MOUTH DAILY Patient taking differently: Take 10 mg by mouth daily.  12/26/18  Yes Reed, Tiffany L, DO  furosemide (LASIX) 40 MG tablet TAKE 1 TABLET(40 MG) BY MOUTH DAILY Patient taking differently: Take 40 mg by mouth daily.  07/23/18  Yes Josue Hector, MD  insulin detemir (LEVEMIR) 100 UNIT/ML injection Inject 0.25 mLs (25 Units total) into the skin at bedtime. Decrease dose from 48 units to 25 units until diet further advanced.  Then may resume home dose. 04/03/19  Yes [place] Eric J, DO  lidocaine (XYLOCAINE) 2 % solution Use as directed 15 mLs in the mouth or throat 3 (three) times daily before meals for 14 days. 04/03/19 04/17/19 Yes [place] Eric J, DO  loratadine (CLARITIN) 10 MG tablet Take 10 mg by mouth daily as needed for allergies.   Yes [provider]  MAGNESIUM PO Take 500 mg by mouth 2 (two) times daily in the am and at bedtime..   Yes [provider]  Multiple Vitamins-Minerals (MULTIVITAMIN WITH MINERALS) tablet Take 1 tablet by mouth daily.     Yes [provider]  nadolol (CORGARD) 40 MG tablet Take 1 tablet (40 mg total) by mouth daily. 02/28/19  Yes Reed, Tiffany L, DO  NOVOLOG FLEXPEN 100 UNIT/ML FlexPen Inject 12 units under the skin every morning, 8 units at lunch and 12 units at supper Patient taking differently: Inject 8-12 Units into the skin See admin instructions. Inject 12 units  under the skin every morning, 8 units at lunch and 12 units at supper 02/19/19  Yes Reed, Tiffany L, DO  pantoprazole (PROTONIX) 40 MG tablet Take 1 tablet (40 mg total) by mouth daily. 04/03/19 06/02/19 Yes [place] Eric J, DO  Polyethyl Glycol-Propyl Glycol (SYSTANE OP) Place 1 drop into both eyes 2 (two) times daily.   Yes [provider]  potassium chloride SA (KLOR-CON) 20 MEQ tablet Take 1 tablet (20 mEq total) by mouth daily. 03/11/19  Yes Reed, Tiffany L, DO  Probiotic Product (PROBIOTIC DAILY PO) Take 1 capsule by mouth daily. Digestive  Advantage Probiotic   Yes [provider]  spironolactone (ALDACTONE) 100 MG tablet TAKE 1 TABLET(100 MG) BY MOUTH TWICE DAILY Patient taking differently: Take 100 mg by mouth 2 (two) times daily.  09/10/18  Yes [name], MD  sucralfate (CARAFATE) 1 GM/10ML suspension Take 10 mLs (1 g total) by mouth 4 (four) times daily -  with meals and at bedtime for 14 days. 04/03/19 04/17/19 Yes [place] Donnamarie Poag, DO  BD PEN NEEDLE NANO U/F 32G X 4 MM MISC USE THREE TIMES DAILY AS DIRECTED 11/26/18   Reed, Tiffany L, DO  glucose blood test strip One Touch Ultra II strips. Use to test blood sugar three times daily. Dx: E11.65 06/01/17   Reed, Tiffany L, DO  HYDROcodone-acetaminophen (HYCET) 7.5-325 mg/15 ml solution Take 15 mLs by mouth every 6 (six) hours as needed for up to 10 days for moderate pain. Patient not taking: Reported on 04/06/2019 04/03/19 04/13/19  [place] Eric J, DO  Insulin Syringe-Needle U-100 (INSULIN SYRINGE 1CC/31GX5/16") 31G X 5/16" 1 ML MISC USE AS DIRECTED DAILY WITH LEVEMIR 07/27/18   Reed, Tiffany L, DO     Vital Signs: BP 102/67    Pulse 73    Temp 98 F (36.7 C) (Oral)    Resp 19    Ht 5' 3"  (1.6 m)    Wt 141 lb 15.6 oz (64.4 kg)    SpO2 95%    BMI 25.15 kg/m   Physical Exam Vitals signs and nursing note reviewed.  Constitutional:      General: She is not in acute distress. HENT:     Head: Normocephalic.     Mouth/Throat:     Mouth: Mucous membranes are moist.     Pharynx: Oropharynx is clear. No oropharyngeal exudate or posterior oropharyngeal erythema.  Eyes:     General: No scleral icterus. Cardiovascular:     Rate and Rhythm: Normal rate and regular rhythm.  Pulmonary:     Effort: Pulmonary effort is normal.     Breath sounds: Normal breath sounds.  Abdominal:     General: Bowel sounds are normal. There is no distension.     Palpations: Abdomen is soft.     Tenderness: There is no abdominal tenderness.  Skin:    General: Skin is warm and dry.      Coloration: Skin is not jaundiced.  Neurological:     Mental Status: She is alert and oriented to person, place, and time.  Psychiatric:        Mood and Affect: Mood normal.        Behavior: Behavior normal.        Thought Content: Thought content normal.        Judgment: Judgment normal.     Imaging: Ct Abdomen Pelvis W Contrast  Result Date: 04/06/2019 CLINICAL DATA:  Patient with vomiting, diarrhea and right upper quadrant pain. Status  post esophageal band. EXAM: CT ABDOMEN AND PELVIS WITH CONTRAST TECHNIQUE: Multidetector CT imaging of the abdomen and pelvis was performed using the standard protocol following bolus administration of intravenous contrast. CONTRAST:  127m OMNIPAQUE IOHEXOL 300 MG/ML  SOLN COMPARISON:  MRI abdomen 04/07/2017 FINDINGS: Lower chest: Normal heart size. Dependent atelectasis within the bilateral lower lobes. No pleural effusion. Hepatobiliary: Morphologic changes to the liver compatible with cirrhosis. Heterogeneous attenuation of the liver. Mild gallbladder wall thickening, likely secondary to cirrhosis. No definite intrahepatic or extrahepatic biliary ductal dilatation. The portal vein is attenuated centrally (image 22; series 3) most compatible thrombus. Pancreas: Unremarkable Spleen: Enlarged and heterogeneous in attenuation. And like low-attenuation centrally. Splenic infarct not entirely excluded. Adrenals/Urinary Tract: Normal adrenal glands. Kidneys enhance symmetrically with contrast. No hydronephrosis. Urinary bladder is unremarkable. Stomach/Bowel: There is circumferential wall thickening of the cecum and ascending colon. No evidence for small bowel obstruction. Normal morphology of the stomach. Small amount of fluid within the small bowel mesentery. Paraesophageal varices. Vascular/Lymphatic: Normal caliber abdominal aorta. Peripheral calcified atherosclerotic plaque. No retroperitoneal lymphadenopathy. Reproductive: Status post hysterectomy. Other: Moderate  volume ascites throughout the abdomen. Musculoskeletal: Lumbar spine degenerative changes. Lumbar spinal fusion hardware. No aggressive or acute appearing osseous lesions. IMPRESSION: 1. The portal vein is attenuated centrally most compatible with portal venous thrombus which appears to have progressed from prior exam. 2. There is circumferential wall thickening of the cecum and ascending colon which may be secondary to portal venous hypertension or colitis. 3. Morphologic changes to the liver compatible with cirrhosis. Heterogeneous attenuation of the liver which may be secondary to cirrhosis. 4. Bandlike heterogeneity of the spleen raising the possibility of splenic infarct. 5. Moderate volume ascites throughout the abdomen and pelvis. 6. Paraesophageal varices. Electronically Signed   By: DLovey NewcomerM.D.   On: 04/06/2019 15:56    Labs:  CBC: Recent Labs    04/06/19 1300 04/07/19 0350 04/07/19 1317 04/08/19 0608  WBC 8.0 6.0 10.0 5.2  HGB 14.1 13.0 12.7 11.1*  HCT 43.2 39.0 39.8 34.1*  PLT 117* 106* 98* 93*    COAGS: Recent Labs    04/25/18 1453 03/11/19 0932 04/02/19 0405 04/07/19 0350  INR 1.1* 1.1* 1.2 1.2    BMP: Recent Labs    04/03/19 0359 04/06/19 1300 04/07/19 0350 04/08/19 0608  NA 135 133* 135 140  K 3.8 4.2 3.7 3.9  CL 107 100 104 109  CO2 21* 23 22 24   GLUCOSE 83 211* 181* 115*  BUN 15 11 12 16   CALCIUM 8.8* 9.0 8.5* 8.8*  CREATININE 0.77 0.88 0.90 1.09*  GFRNONAA >60 >60 >60 51*  GFRAA >60 >60 >60 59*    LIVER FUNCTION TESTS: Recent Labs    04/01/19 1319 04/02/19 0405 04/06/19 1300 04/07/19 0350  BILITOT 1.0 1.1 1.4* 1.3*  AST 55* 36 46* 36  ALT 44 33 34 29  ALKPHOS 109 80 95 71  PROT 7.9 6.0* 7.3 6.0*  ALBUMIN 3.3* 2.6* 2.9* 2.4*    Assessment and Plan:  73y/o F with history of large, bleeding esophageal varices s/p banding on 03/29/19 with Dr. JArdis Hughs She was seen yesterday by weekend PA for possible emergent TIPS - patient was  undergoing a repeat EGD with GI for variceal bleeding at that time and there was concern that hemostasis would not be able to be achieved. Hemostasis was able to be achieved at that time after clipping, injection and banding of multiple varices. Unfortunately, the patient has continued to have maroon/red  stool today and IR has been asked to proceed with urgent TIPS placement today.  She is currently hemodynamically stable and denies any complaints except hunger. Afebrile, WBC 5.2, hgb 11.1, plt 93, creatinine 1.09, t.bili 1.3, INR 1.2. MELD-NA score 10.  Plan to proceed with urgent TIPS placement today in IR with general anesthesia. Patient has been NPO since 11/29 with a small cup of carafate this morning per RN.   Risks and benefits of TIPS, BRTO and/or additional variceal embolization were discussed with the patient and/or the patient's family including, but not limited to, infection, bleeding, damage to adjacent structures, worsening hepatic and/or cardiac function, worsening and/or the development of altered mental status/encephalopathy, non-target embolization and death.   This interventional procedure involves the use of X-rays and because of the nature of the planned procedure, it is possible that we will have prolonged use of X-ray fluoroscopy.  Potential radiation risks to you include (but are not limited to) the following: - A slightly elevated risk for cancer  several years later in life. This risk is typically less than 0.5% percent. This risk is low in comparison to the normal incidence of human cancer, which is 33% for women and 50% for men according to the [place]. - Radiation induced injury can include skin redness, resembling a rash, tissue breakdown / ulcers and hair loss (which can be temporary or permanent).   The likelihood of either of these occurring depends on the difficulty of the procedure and whether you are sensitive to radiation due to previous  procedures, disease, or genetic conditions.   IF your procedure requires a prolonged use of radiation, you will be notified and given written instructions for further action.  It is your responsibility to monitor the irradiated area for the 2 weeks following the procedure and to notify your physician if you are concerned that you have suffered a radiation induced injury.    All of the patient's questions were answered, patient is agreeable to proceed.  Consent signed and in IR control room.   Electronically Signed: Joaquim Nam, PA-C 04/08/2019, 11:40 AM   I spent a total of 25 Minutes at the the patient's bedside AND on the patient's hospital floor or unit, greater than 50% of which was counseling/coordinating care for TIPS.

## 2019-04-09 ENCOUNTER — Other Ambulatory Visit: Payer: Self-pay | Admitting: Interventional Radiology

## 2019-04-09 ENCOUNTER — Encounter (HOSPITAL_COMMUNITY): Payer: Self-pay | Admitting: Interventional Radiology

## 2019-04-09 ENCOUNTER — Other Ambulatory Visit: Payer: Self-pay | Admitting: Physician Assistant

## 2019-04-09 DIAGNOSIS — E119 Type 2 diabetes mellitus without complications: Secondary | ICD-10-CM | POA: Diagnosis not present

## 2019-04-09 DIAGNOSIS — Z794 Long term (current) use of insulin: Secondary | ICD-10-CM

## 2019-04-09 DIAGNOSIS — R188 Other ascites: Secondary | ICD-10-CM | POA: Diagnosis not present

## 2019-04-09 DIAGNOSIS — I8501 Esophageal varices with bleeding: Secondary | ICD-10-CM

## 2019-04-09 DIAGNOSIS — K7460 Unspecified cirrhosis of liver: Secondary | ICD-10-CM | POA: Diagnosis not present

## 2019-04-09 DIAGNOSIS — D696 Thrombocytopenia, unspecified: Secondary | ICD-10-CM

## 2019-04-09 DIAGNOSIS — I81 Portal vein thrombosis: Secondary | ICD-10-CM | POA: Diagnosis not present

## 2019-04-09 LAB — POCT I-STAT 7, (LYTES, BLD GAS, ICA,H+H)
Acid-base deficit: 9 mmol/L — ABNORMAL HIGH (ref 0.0–2.0)
Bicarbonate: 13.4 mmol/L — ABNORMAL LOW (ref 20.0–28.0)
Calcium, Ion: 1.14 mmol/L — ABNORMAL LOW (ref 1.15–1.40)
HCT: 37 % (ref 36.0–46.0)
Hemoglobin: 12.6 g/dL (ref 12.0–15.0)
O2 Saturation: 96 %
Patient temperature: 97.9
Potassium: 4.7 mmol/L (ref 3.5–5.1)
Sodium: 138 mmol/L (ref 135–145)
TCO2: 14 mmol/L — ABNORMAL LOW (ref 22–32)
pCO2 arterial: 20.4 mmHg — ABNORMAL LOW (ref 32.0–48.0)
pH, Arterial: 7.425 (ref 7.350–7.450)
pO2, Arterial: 73 mmHg — ABNORMAL LOW (ref 83.0–108.0)

## 2019-04-09 LAB — CBC
HCT: 46.3 % — ABNORMAL HIGH (ref 36.0–46.0)
HCT: 46.8 % — ABNORMAL HIGH (ref 36.0–46.0)
Hemoglobin: 14.3 g/dL (ref 12.0–15.0)
Hemoglobin: 14.4 g/dL (ref 12.0–15.0)
MCH: 31.3 pg (ref 26.0–34.0)
MCH: 31.7 pg (ref 26.0–34.0)
MCHC: 30.6 g/dL (ref 30.0–36.0)
MCHC: 31.1 g/dL (ref 30.0–36.0)
MCV: 102 fL — ABNORMAL HIGH (ref 80.0–100.0)
MCV: 102.4 fL — ABNORMAL HIGH (ref 80.0–100.0)
Platelets: 134 10*3/uL — ABNORMAL LOW (ref 150–400)
Platelets: 138 10*3/uL — ABNORMAL LOW (ref 150–400)
RBC: 4.54 MIL/uL (ref 3.87–5.11)
RBC: 4.57 MIL/uL (ref 3.87–5.11)
RDW: 14.6 % (ref 11.5–15.5)
RDW: 14.6 % (ref 11.5–15.5)
WBC: 19.9 10*3/uL — ABNORMAL HIGH (ref 4.0–10.5)
WBC: 20.4 10*3/uL — ABNORMAL HIGH (ref 4.0–10.5)
nRBC: 0 % (ref 0.0–0.2)
nRBC: 0 % (ref 0.0–0.2)

## 2019-04-09 LAB — BASIC METABOLIC PANEL
Anion gap: 14 (ref 5–15)
Anion gap: 24 — ABNORMAL HIGH (ref 5–15)
BUN: 18 mg/dL (ref 8–23)
BUN: 18 mg/dL (ref 8–23)
BUN: 27 mg/dL — ABNORMAL HIGH (ref 8–23)
CO2: 13 mmol/L — ABNORMAL LOW (ref 22–32)
CO2: 9 mmol/L — ABNORMAL LOW (ref 22–32)
CO2: <7 mmol/L — ABNORMAL LOW (ref 22–32)
Calcium: 8.3 mg/dL — ABNORMAL LOW (ref 8.9–10.3)
Calcium: 8.6 mg/dL — ABNORMAL LOW (ref 8.9–10.3)
Calcium: 8.6 mg/dL — ABNORMAL LOW (ref 8.9–10.3)
Chloride: 109 mmol/L (ref 98–111)
Chloride: 110 mmol/L (ref 98–111)
Chloride: 111 mmol/L (ref 98–111)
Creatinine, Ser: 1.4 mg/dL — ABNORMAL HIGH (ref 0.44–1.00)
Creatinine, Ser: 1.5 mg/dL — ABNORMAL HIGH (ref 0.44–1.00)
Creatinine, Ser: 1.75 mg/dL — ABNORMAL HIGH (ref 0.44–1.00)
GFR calc Af Amer: 33 mL/min — ABNORMAL LOW (ref >60)
GFR calc Af Amer: 40 mL/min — ABNORMAL LOW (ref >60)
GFR calc Af Amer: 43 mL/min — ABNORMAL LOW (ref >60)
GFR calc non Af Amer: 29 mL/min — ABNORMAL LOW (ref >60)
GFR calc non Af Amer: 34 mL/min — ABNORMAL LOW (ref >60)
GFR calc non Af Amer: 37 mL/min — ABNORMAL LOW (ref >60)
Glucose, Bld: 184 mg/dL — ABNORMAL HIGH (ref 70–99)
Glucose, Bld: 242 mg/dL — ABNORMAL HIGH (ref 70–99)
Glucose, Bld: 309 mg/dL — ABNORMAL HIGH (ref 70–99)
Potassium: 4.5 mmol/L (ref 3.5–5.1)
Potassium: 4.7 mmol/L (ref 3.5–5.1)
Potassium: 4.7 mmol/L (ref 3.5–5.1)
Sodium: 137 mmol/L (ref 135–145)
Sodium: 142 mmol/L (ref 135–145)
Sodium: 142 mmol/L (ref 135–145)

## 2019-04-09 LAB — GLUCOSE, CAPILLARY
Glucose-Capillary: 175 mg/dL — ABNORMAL HIGH (ref 70–99)
Glucose-Capillary: 226 mg/dL — ABNORMAL HIGH (ref 70–99)
Glucose-Capillary: 237 mg/dL — ABNORMAL HIGH (ref 70–99)
Glucose-Capillary: 275 mg/dL — ABNORMAL HIGH (ref 70–99)
Glucose-Capillary: 334 mg/dL — ABNORMAL HIGH (ref 70–99)

## 2019-04-09 LAB — LACTIC ACID, PLASMA
Lactic Acid, Venous: 4 mmol/L (ref 0.5–1.9)
Lactic Acid, Venous: 2.9 mmol/L (ref 0.5–1.9)

## 2019-04-09 MED ORDER — DEXTROSE-NACL 5-0.45 % IV SOLN
INTRAVENOUS | Status: DC
  Administered 2019-04-09 – 2019-04-10 (×2): via INTRAVENOUS

## 2019-04-09 MED ORDER — POTASSIUM CHLORIDE 10 MEQ/100ML IV SOLN
10.0000 meq | INTRAVENOUS | Status: AC
  Administered 2019-04-09 (×2): 10 meq via INTRAVENOUS
  Filled 2019-04-09 (×2): qty 100

## 2019-04-09 MED ORDER — INSULIN REGULAR(HUMAN) IN NACL 100-0.9 UT/100ML-% IV SOLN
INTRAVENOUS | Status: DC
  Administered 2019-04-09: 10 [IU]/h via INTRAVENOUS
  Filled 2019-04-09 (×2): qty 100

## 2019-04-09 MED ORDER — SODIUM CHLORIDE 0.9 % IV SOLN
INTRAVENOUS | Status: DC
  Administered 2019-04-09: 22:00:00 via INTRAVENOUS

## 2019-04-09 MED ORDER — SODIUM CHLORIDE 0.9 % IV SOLN
1.0000 g | INTRAVENOUS | Status: DC
  Administered 2019-04-09 – 2019-04-12 (×4): 1 g via INTRAVENOUS
  Filled 2019-04-09 (×2): qty 1
  Filled 2019-04-09: qty 10
  Filled 2019-04-09: qty 1

## 2019-04-09 MED ORDER — FENTANYL CITRATE (PF) 100 MCG/2ML IJ SOLN
25.0000 ug | INTRAMUSCULAR | Status: DC | PRN
  Administered 2019-04-09: 25 ug via INTRAVENOUS
  Filled 2019-04-09: qty 2

## 2019-04-09 MED ORDER — SODIUM BICARBONATE 8.4 % IV SOLN: INTRAVENOUS | Status: DC

## 2019-04-09 MED ORDER — DEXTROSE 50 % IV SOLN: 0.0000 mL | INTRAVENOUS | Status: DC | PRN

## 2019-04-09 MED ORDER — SODIUM CHLORIDE 0.9 % IV BOLUS: 1000.0000 mL | Freq: Once | INTRAVENOUS | Status: DC

## 2019-04-09 MED ORDER — SODIUM BICARBONATE-DEXTROSE 150-5 MEQ/L-% IV SOLN
150.0000 meq | INTRAVENOUS | Status: DC
  Administered 2019-04-09: 150 meq via INTRAVENOUS
  Filled 2019-04-09: qty 1000

## 2019-04-09 MED ORDER — HYDRALAZINE HCL 20 MG/ML IJ SOLN
10.0000 mg | Freq: Four times a day (QID) | INTRAMUSCULAR | Status: DC | PRN
  Administered 2019-04-09: 10 mg via INTRAVENOUS
  Filled 2019-04-09: qty 1

## 2019-04-09 MED ORDER — SODIUM CHLORIDE 0.9 % IV BOLUS
1000.0000 mL | INTRAVENOUS | Status: AC
  Administered 2019-04-09: 22:00:00 1000 mL via INTRAVENOUS

## 2019-04-09 NOTE — Progress Notes (Addendum)
PROGRESS NOTE    [name]  KJZ:[id number] DOB: 1945-11-16 DOA: 04/06/2019 PCP: Gayland Curry, DO    Brief Narrative:  73 year old female with a history of [name] cirrhosis, coronary artery disease, diabetes, having undergone EGD with banding of varices on 11/19.  Since her procedure, she was not able to eat or drink without having retrosternal pain.  She was seen by GI and went back for endoscopy which showed multiple oozing varices that were injected with epinephrine.  She was admitted to the ICU for further monitoring.  Despite GI intervention, patient continued to bleed.  Interventional radiology evaluated the patient and performed urgent TIPS as well as coil embolization of varices.   Assessment & Plan:   Principal Problem:   Portal vein thrombosis Active Problems:   Hyperlipemia   DM (diabetes mellitus) (HCC)   S/P CABG x 3   CAD (coronary artery disease)   Liver cirrhosis (HCC)   Thrombocytopenia (HCC)   Infarction of spleen   Bleeding esophageal varices (HCC)   UGI bleed   GI bleeding from esophageal varices.  Seen by GI and underwent endoscopy where esophageal varices were banded.  Despite this measure, GI bleeding continued.  She was seen by interventional radiology and underwent urgent TIPS with collateralization of esophageal varices.  Follow-up hemoglobins have been stable.  Continued on Protonix infusion as well as octreotide. Acute blood loss anemia hemoglobin from 15 admission to 11.1.  Follow-up hemoglobins have stabilized. Metabolic acidosis.  This appears to have occurred in the last 24 hours.  Blood sugars have been trending up.  Blood gas shows a pH of 7.4.  Question related to recent embolization procedure leading to infarction.  Clinically, the patient appears to be stable.  We will continue with IV fluids.  Start on IV insulin protocol for hyperglycemia and check beta hydroxybutyric acid Thrombocytopenia.  Related to liver disease. Diabetes she is  chronically on Levemir, Jardiance, and NovoLog.  These may be resumed once blood sugars are controlled and serum bicarbonate has stabilized. Coronary artery disease.  No complaint of chest pain at this time.  Resume aspirin when cleared by GI. Nash Cirrhosis.  Holding on Lasix and Aldactone in the setting of low blood pressure and metabolic acidosis.  She is on ceftriaxone for SBP prophylaxis in setting of GI bleeding with varices. Portal vein thrombosis.  Per GI, chronic finding, first seen in 2018.  She is not a candidate for anticoagulation. Acute kidney injury.  Creatinine has trended up to 1.5.  Possibly related to transient hypotension.  Continue to follow creatinine with hydration and urine output.   DVT prophylaxis: SCDs Code Status: Full code Family Communication: Discussed with husband at the bedside Disposition Plan: May need placement.  Continue stay in progressive care unit   Consultants:  Interventional radiology Gastroenterology  Procedures:  TIPS Coil embolization of esophageal varices Ultrasound-guided paracentesis with removal of 1.5 L of fluid EGD:- Multiple esophageal ulcers-one was actively                            bleeding in the distal; injected with epinephrine                            and 2 clips (MR unsafe) were placed.                           -  Esophageal varix at 35 cm-ulcer with stigmata of                            recent bleeding-banded x 1..                           - Portal hypertensive gastropathy.                           - Normal duodenal bulb.                           - No specimens collected.  Antimicrobials:  Ceftriaxone 12/1 >   Subjective: Patient is seen sitting up in a chair.  She denies any shortness of breath.  She does not have any significant pain.  Objective: Vitals:   04/09/19 1730 04/09/19 1800 04/09/19 1830 04/09/19 2000  BP:  93/65  (!) 77/52  Pulse: 73 73 74 72  Resp: 20 (!) 22 19 18   Temp:    97.8 F (36.6 C)    TempSrc:      SpO2: 98% 98% 97% 97%  Weight:      Height:        Intake/Output Summary (Last 24 hours) at 04/09/2019 2122 Last data filed at 04/09/2019 1800 Gross per 24 hour  Intake 1786.18 ml  Output 760 ml  Net 1026.18 ml   Filed Weights   04/06/19 2109 04/08/19 0500 04/09/19 0340  Weight: 67.1 kg 64.4 kg 68.9 kg    Examination:  General exam: Appears calm and comfortable  Respiratory system: Clear to auscultation. Respiratory effort normal. Cardiovascular system: S1 & S2 heard, RRR. No JVD, murmurs, rubs, gallops or clicks. No pedal edema. Gastrointestinal system: Abdomen is distended, soft and nontender. No organomegaly or masses felt. Normal bowel sounds heard. Central nervous system: Alert and oriented. No focal neurological deficits. Extremities: Symmetric 5 x 5 power. Skin: No rashes, lesions or ulcers Psychiatry: Judgement and insight appear normal. Mood & affect appropriate.     Data Reviewed: I have personally reviewed following labs and imaging studies  CBC: Recent Labs  Lab 04/03/19 0359  04/07/19 0350  04/07/19 1317 04/08/19 0608 04/08/19 1200 04/08/19 2000 04/08/19 2342 04/09/19 0316 04/09/19 1531  WBC 4.5   < > 6.0  --  10.0 5.2  --   --  19.9* 20.4*  --   NEUTROABS 2.4  --   --   --   --   --   --   --   --   --   --   HGB 13.6   < > 13.0   < > 12.7 11.1* 11.5* 13.7 14.3 14.4 12.6  HCT 41.9   < > 39.0   < > 39.8 34.1* 34.5* 42.0 46.8* 46.3* 37.0  MCV 96.3   < > 94.0  --  99.0 97.2  --   --  102.4* 102.0*  --   PLT 109*   < > 106*  --  98* 93*  --   --  138* 134*  --    < > = values in this interval not displayed.   Basic Metabolic Panel: Recent Labs  Lab 04/07/19 0350 04/07/19 1104 04/08/19 0608 04/08/19 2342 04/09/19 0316 04/09/19 1530 04/09/19 1531  NA 135 138 140 142 142 137 138  K 3.7 5.4* 3.9  4.7 4.5 4.7 4.7  CL 104 105 109 109 111 110  --   CO2 22  --  24 9* <7* 13*  --   GLUCOSE 181* 165* 115* 184* 242* 309*  --   BUN 12 18  16 18 18  27*  --   CREATININE 0.90 0.80 1.09* 1.50* 1.40* 1.75*  --   CALCIUM 8.5*  --  8.8* 8.6* 8.6* 8.3*  --    GFR: Estimated Creatinine Clearance: 27.1 mL/min (A) (by C-G formula based on SCr of 1.75 mg/dL (H)). Liver Function Tests: Recent Labs  Lab 04/06/19 1300 04/07/19 0350  AST 46* 36  ALT 34 29  ALKPHOS 95 71  BILITOT 1.4* 1.3*  PROT 7.3 6.0*  ALBUMIN 2.9* 2.4*   Recent Labs  Lab 04/06/19 1300  LIPASE 23   No results for input(s): AMMONIA in the last 168 hours. Coagulation Profile: Recent Labs  Lab 04/07/19 0350  INR 1.2   Cardiac Enzymes: No results for input(s): CKTOTAL, CKMB, CKMBINDEX, TROPONINI in the last 168 hours. BNP (last 3 results) No results for input(s): PROBNP in the last 8760 hours. HbA1C: No results for input(s): HGBA1C in the last 72 hours. CBG: Recent Labs  Lab 04/09/19 0257 04/09/19 0748 04/09/19 1212 04/09/19 1608 04/09/19 2016  GLUCAP 175* 226* 237* 275* 334*   Lipid Profile: No results for input(s): CHOL, HDL, LDLCALC, TRIG, CHOLHDL, LDLDIRECT in the last 72 hours. Thyroid Function Tests: No results for input(s): TSH, T4TOTAL, FREET4, T3FREE, THYROIDAB in the last 72 hours. Anemia Panel: No results for input(s): VITAMINB12, FOLATE, FERRITIN, TIBC, IRON, RETICCTPCT in the last 72 hours. Sepsis Labs: Recent Labs  Lab 04/09/19 1530 04/09/19 1818  LATICACIDVEN 4.0* 2.9*    Recent Results (from the past 240 hour(s))  SARS CORONAVIRUS 2 (TAT 6-24 HRS) Nasopharyngeal Nasopharyngeal Swab     Status: None   Collection Time: 04/01/19  3:29 PM   Specimen: Nasopharyngeal Swab  Result Value Ref Range Status   SARS Coronavirus 2 NEGATIVE NEGATIVE Final    Comment: (NOTE) SARS-CoV-2 target nucleic acids are NOT DETECTED. The SARS-CoV-2 RNA is generally detectable in upper and lower respiratory specimens during the acute phase of infection. Negative results do not preclude SARS-CoV-2 infection, do not rule out co-infections with  other pathogens, and should not be used as the sole basis for treatment or other patient management decisions. Negative results must be combined with clinical observations, patient history, and epidemiological information. The expected result is Negative. Fact Sheet for Patients: SugarRoll.be Fact Sheet for Healthcare Providers: https://www.woods-mathews.com/ This test is not yet approved or cleared by the [place] FDA and  has been authorized for detection and/or diagnosis of SARS-CoV-2 by FDA under an Emergency Use Authorization (EUA). This EUA will remain  in effect (meaning this test can be used) for the duration of the COVID-19 declaration under Section 56 4(b)(1) of the Act, 21 U.S.C. section 360bbb-3(b)(1), unless the authorization is terminated or revoked sooner. Performed at [facility], [place] [address], [place], [place] [address]   SARS CORONAVIRUS 2 (TAT 6-24 HRS) Nasopharyngeal Nasopharyngeal Swab     Status: None   Collection Time: 04/06/19  8:09 PM   Specimen: Nasopharyngeal Swab  Result Value Ref Range Status   SARS Coronavirus 2 NEGATIVE NEGATIVE Final    Comment: (NOTE) SARS-CoV-2 target nucleic acids are NOT DETECTED. The SARS-CoV-2 RNA is generally detectable in upper and lower respiratory specimens during the acute phase of infection. Negative results do not  preclude SARS-CoV-2 infection, do not rule out co-infections with other pathogens, and should not be used as the sole basis for treatment or other patient management decisions. Negative results must be combined with clinical observations, patient history, and epidemiological information. The expected result is Negative. Fact Sheet for Patients: SugarRoll.be Fact Sheet for Healthcare Providers: https://www.woods-mathews.com/ This test is not yet approved or cleared by the [place] FDA and  has been authorized  for detection and/or diagnosis of SARS-CoV-2 by FDA under an Emergency Use Authorization (EUA). This EUA will remain  in effect (meaning this test can be used) for the duration of the COVID-19 declaration under Section 56 4(b)(1) of the Act, 21 U.S.C. section 360bbb-3(b)(1), unless the authorization is terminated or revoked sooner. Performed at [facility], [place] [address] [place], [place] [address]   MRSA PCR Screening     Status: None   Collection Time: 04/07/19  1:42 PM   Specimen: Nasal Mucosa; Nasopharyngeal  Result Value Ref Range Status   MRSA by PCR NEGATIVE NEGATIVE Final    Comment:        The GeneXpert MRSA Assay (FDA approved for NASAL specimens only), is one component of a comprehensive MRSA colonization surveillance program. It is not intended to diagnose MRSA infection nor to guide or monitor treatment for MRSA infections. Performed at [facility], [place] [address] [place], [place] [address]          Radiology Studies: Ir Angiogram Selective Each Additional Vessel  Result Date: 04/09/2019 CLINICAL DATA:  Cirrhosis, portal venous hypertension, large esophageal varices, recurrent bleeding despite aggressive endoscopic treatment. EXAM: 1. TIPS CREATION 2. ULTRASOUND-GUIDED VENOUS ACCESS 3. PARACENTESIS WITH ULTRASOUND GUIDANCE ANESTHESIA/SEDATION: General - as administered by the Anesthesia department MEDICATIONS: No periprocedural antibiotics were indicated FLUOROSCOPY TIME:  67 min , 24 sec; 622 mGy COMPLICATIONS: None immediate. PROCEDURE: Informed written consent was obtained from the patient after a thorough discussion of the procedural risks, benefits and alternatives. All questions were addressed. See previous consultation. Maximal Sterile Barrier Technique was utilized including caps, mask, sterile gowns, sterile gloves, sterile drape, hand hygiene and skin antiseptic. A timeout was performed prior to the initiation of the procedure. Patient was  placed under general anesthetic by department of anesthesia personnel. The skin overlying the right upper abdominal quadrant as well as the right neck and femoral region were prepped and draped in usual sterile fashion. Initial ultrasound scanning demonstrates a moderate amount of recurrent perihepatic ascites. Under ultrasound guidance, a 6 [place] Safe-T-Centesis needle was advanced into the peritoneal cavity from a right lateral approach for paracentesis removing 1.5 L clear yellow ascites. Under direct ultrasound guidance, a peripheral aspect of the right portal vein was accessed with a 21 gauge micropuncture needle. Ultrasound image was saved for procedural documentation purposes. Guidewire advanced into the main portal vein. Micropuncture transitional dilator was advanced over the guidewire, with the radiopaque transition positioned at the target entrance to the peripheral right portal vein. Next, the right internal jugular vein was accessed under direct ultrasound. Ultrasound image was saved for procedural documentation purposes. This allowed for placement of the 10 French TIPS vascular sheath. With the aid of angiographic guidewires, an MPA catheter was utilized to select the right hepatic vein and a hepatic venogram was performed.  Sheath advanced into the right hepatic vei for n. Under fluoroscopic guidance, the right portal vein with targeted with a Rsch-Uchida TIPS needle directed at the radiopaque target within the right portal vein. Ultimately, the right  portal vein was accessed at a desirable location allowing advancement of a stiff Glidewire into the main portal vein. A 4 French glide catheter was advanced over the stiff Glidewire and a portal venogram was performed. Pressure measurements were obtained: Right atrium mean 21 mmHg, portal vein 36 mmHg; portosystemic gradient 15 mmHg preprocedure. Next, as there was difficulty advancing a measuring catheter through the intrahepatic track, the track was  dilated with a 81m Mustang balloon. Measurements were taken for pre deployment planning. Subsequently a 7 mm Conquest balloon was used through the tract using a stiff Amplatz guidewire, allowing advancement of the sheath into the main portal vein. A portal venogram was performed. Tract further dilated with the 7 mm Conquest balloon to facilitate advancement of the sheath into the main portal vein. The percutaneous portal microcatheter and wire were removed. Next, a 2 cm (un covered) x 8 cm (covered) x 10 mm diameter GORE VIATORR TIPS Endoprosthesis was advanced through the sheath across the intra hepatic track and deployed. The TIPS stent was angioplastied in multiple stations to 10 mm diameter. Follow-up portal venogram demonstrated good stent deployment, with little antegrade flow through the portal system and stent, primary outflow via the dilated branch 2 esophageal varices. Some intraluminal thrombus was evident. The branch to the esophageal varices was selectively catheterized with a 5 [place] catheter. The vein was embolized with 8 and 6 mm interlock 035 coils. The 10 mm mustang balloon was then used to macerate the thrombus in the central aspect of the splenic vein through the main portal vein and TIPS stent. Subsequent portal venography demonstrates occlusion of the vein to the esophageal varices. Some residual mural thrombus in the central splenic vein and portosplenic confluence , good flow through the TIPS stent with no stenosis or thrombus. Prolonged dilatation of the stenosis in the central splenic vein and portal splenic confluence was performed with the 10 mm balloon. Subsequent venography demonstrates continued good flow through the splenic vein and portosplenic confluence, wide patency of the TIPS stent, no significant stenosis. Mild residual clot in the central splenic vein. Continued opacification of posterior gastric vein. Pressure measurements were obtained: Right atrium mean 16 mmHg,  portal vein 20 mmHg mean, portosystemic gradient 4 mm Hg final. All wires, catheters and sheaths were removed from the patient. Hemostasis was achieved at the right IJ access site with manual compression. A dressing was placed. Dr. AMarkus Daftassisted with the latter portion of the procedure. The patient tolerated the procedure well. Patient transferred to the PACU. IMPRESSION: 1. Technically successful TIPS creation with decrease in mean portosystemic pressure from 15 to 4 mmHg. 2. Technically successful coil embolization of esophageal varices. 3. Ultrasound guided paracentesis removing 1.5 L clear yellow ascites. Electronically Signed   By: DLucrezia EuropeM.D.   On: 04/09/2019 08:47   Ir Tips  Result Date: 04/09/2019 CLINICAL DATA:  Cirrhosis, portal venous hypertension, large esophageal varices, recurrent bleeding despite aggressive endoscopic treatment. EXAM: 1. TIPS CREATION 2. ULTRASOUND-GUIDED VENOUS ACCESS 3. PARACENTESIS WITH ULTRASOUND GUIDANCE ANESTHESIA/SEDATION: General - as administered by the Anesthesia department MEDICATIONS: No periprocedural antibiotics were indicated FLUOROSCOPY TIME:  67 min , 24 sec; 4381mGy COMPLICATIONS: None immediate. PROCEDURE: Informed written consent was obtained from the patient after a thorough discussion of the procedural risks, benefits and alternatives. All questions were addressed. See previous consultation. Maximal Sterile Barrier Technique was utilized including caps, mask, sterile gowns, sterile gloves, sterile drape, hand hygiene and skin antiseptic. A timeout was performed prior to  the initiation of the procedure. Patient was placed under general anesthetic by department of anesthesia personnel. The skin overlying the right upper abdominal quadrant as well as the right neck and femoral region were prepped and draped in usual sterile fashion. Initial ultrasound scanning demonstrates a moderate amount of recurrent perihepatic ascites. Under ultrasound guidance, a  6 [place] Safe-T-Centesis needle was advanced into the peritoneal cavity from a right lateral approach for paracentesis removing 1.5 L clear yellow ascites. Under direct ultrasound guidance, a peripheral aspect of the right portal vein was accessed with a 21 gauge micropuncture needle. Ultrasound image was saved for procedural documentation purposes. Guidewire advanced into the main portal vein. Micropuncture transitional dilator was advanced over the guidewire, with the radiopaque transition positioned at the target entrance to the peripheral right portal vein. Next, the right internal jugular vein was accessed under direct ultrasound. Ultrasound image was saved for procedural documentation purposes. This allowed for placement of the 10 French TIPS vascular sheath. With the aid of angiographic guidewires, an MPA catheter was utilized to select the right hepatic vein and a hepatic venogram was performed.  Sheath advanced into the right hepatic vei for n. Under fluoroscopic guidance, the right portal vein with targeted with a Rsch-Uchida TIPS needle directed at the radiopaque target within the right portal vein. Ultimately, the right portal vein was accessed at a desirable location allowing advancement of a stiff Glidewire into the main portal vein. A 4 French glide catheter was advanced over the stiff Glidewire and a portal venogram was performed. Pressure measurements were obtained: Right atrium mean 21 mmHg, portal vein 36 mmHg; portosystemic gradient 15 mmHg preprocedure. Next, as there was difficulty advancing a measuring catheter through the intrahepatic track, the track was dilated with a 15m Mustang balloon. Measurements were taken for pre deployment planning. Subsequently a 7 mm Conquest balloon was used through the tract using a stiff Amplatz guidewire, allowing advancement of the sheath into the main portal vein. A portal venogram was performed. Tract further dilated with the 7 mm Conquest balloon to  facilitate advancement of the sheath into the main portal vein. The percutaneous portal microcatheter and wire were removed. Next, a 2 cm (un covered) x 8 cm (covered) x 10 mm diameter GORE VIATORR TIPS Endoprosthesis was advanced through the sheath across the intra hepatic track and deployed. The TIPS stent was angioplastied in multiple stations to 10 mm diameter. Follow-up portal venogram demonstrated good stent deployment, with little antegrade flow through the portal system and stent, primary outflow via the dilated branch 2 esophageal varices. Some intraluminal thrombus was evident. The branch to the esophageal varices was selectively catheterized with a 5 [place] catheter. The vein was embolized with 8 and 6 mm interlock 035 coils. The 10 mm mustang balloon was then used to macerate the thrombus in the central aspect of the splenic vein through the main portal vein and TIPS stent. Subsequent portal venography demonstrates occlusion of the vein to the esophageal varices. Some residual mural thrombus in the central splenic vein and portosplenic confluence , good flow through the TIPS stent with no stenosis or thrombus. Prolonged dilatation of the stenosis in the central splenic vein and portal splenic confluence was performed with the 10 mm balloon. Subsequent venography demonstrates continued good flow through the splenic vein and portosplenic confluence, wide patency of the TIPS stent, no significant stenosis. Mild residual clot in the central splenic vein. Continued opacification of posterior gastric vein. Pressure measurements were obtained: Right atrium mean 16  mmHg, portal vein 20 mmHg mean, portosystemic gradient 4 mm Hg final. All wires, catheters and sheaths were removed from the patient. Hemostasis was achieved at the right IJ access site with manual compression. A dressing was placed. Dr. Markus Daft assisted with the latter portion of the procedure. The patient tolerated the procedure well.  Patient transferred to the PACU. IMPRESSION: 1. Technically successful TIPS creation with decrease in mean portosystemic pressure from 15 to 4 mmHg. 2. Technically successful coil embolization of esophageal varices. 3. Ultrasound guided paracentesis removing 1.5 L clear yellow ascites. Electronically Signed   By: Lucrezia Europe M.D.   On: 04/09/2019 08:47   [place] Guide Roadmapping  Result Date: 04/09/2019 CLINICAL DATA:  Cirrhosis, portal venous hypertension, large esophageal varices, recurrent bleeding despite aggressive endoscopic treatment. EXAM: 1. TIPS CREATION 2. ULTRASOUND-GUIDED VENOUS ACCESS 3. PARACENTESIS WITH ULTRASOUND GUIDANCE ANESTHESIA/SEDATION: General - as administered by the Anesthesia department MEDICATIONS: No periprocedural antibiotics were indicated FLUOROSCOPY TIME:  67 min , 24 sec; 379 mGy COMPLICATIONS: None immediate. PROCEDURE: Informed written consent was obtained from the patient after a thorough discussion of the procedural risks, benefits and alternatives. All questions were addressed. See previous consultation. Maximal Sterile Barrier Technique was utilized including caps, mask, sterile gowns, sterile gloves, sterile drape, hand hygiene and skin antiseptic. A timeout was performed prior to the initiation of the procedure. Patient was placed under general anesthetic by department of anesthesia personnel. The skin overlying the right upper abdominal quadrant as well as the right neck and femoral region were prepped and draped in usual sterile fashion. Initial ultrasound scanning demonstrates a moderate amount of recurrent perihepatic ascites. Under ultrasound guidance, a 6 [place] Safe-T-Centesis needle was advanced into the peritoneal cavity from a right lateral approach for paracentesis removing 1.5 L clear yellow ascites. Under direct ultrasound guidance, a peripheral aspect of the right portal vein was accessed with a 21 gauge micropuncture  needle. Ultrasound image was saved for procedural documentation purposes. Guidewire advanced into the main portal vein. Micropuncture transitional dilator was advanced over the guidewire, with the radiopaque transition positioned at the target entrance to the peripheral right portal vein. Next, the right internal jugular vein was accessed under direct ultrasound. Ultrasound image was saved for procedural documentation purposes. This allowed for placement of the 10 French TIPS vascular sheath. With the aid of angiographic guidewires, an MPA catheter was utilized to select the right hepatic vein and a hepatic venogram was performed.  Sheath advanced into the right hepatic vei for n. Under fluoroscopic guidance, the right portal vein with targeted with a Rsch-Uchida TIPS needle directed at the radiopaque target within the right portal vein. Ultimately, the right portal vein was accessed at a desirable location allowing advancement of a stiff Glidewire into the main portal vein. A 4 French glide catheter was advanced over the stiff Glidewire and a portal venogram was performed. Pressure measurements were obtained: Right atrium mean 21 mmHg, portal vein 36 mmHg; portosystemic gradient 15 mmHg preprocedure. Next, as there was difficulty advancing a measuring catheter through the intrahepatic track, the track was dilated with a 44m Mustang balloon. Measurements were taken for pre deployment planning. Subsequently a 7 mm Conquest balloon was used through the tract using a stiff Amplatz guidewire, allowing advancement of the sheath into the main portal vein. A portal venogram was performed. Tract further dilated with the 7 mm Conquest balloon to facilitate advancement of the sheath into the main portal vein.  The percutaneous portal microcatheter and wire were removed. Next, a 2 cm (un covered) x 8 cm (covered) x 10 mm diameter GORE VIATORR TIPS Endoprosthesis was advanced through the sheath across the intra hepatic track and  deployed. The TIPS stent was angioplastied in multiple stations to 10 mm diameter. Follow-up portal venogram demonstrated good stent deployment, with little antegrade flow through the portal system and stent, primary outflow via the dilated branch 2 esophageal varices. Some intraluminal thrombus was evident. The branch to the esophageal varices was selectively catheterized with a 5 [place] Kumpe catheter. The vein was embolized with 8 and 6 mm interlock 035 coils. The 10 mm mustang balloon was then used to macerate the thrombus in the central aspect of the splenic vein through the main portal vein and TIPS stent. Subsequent portal venography demonstrates occlusion of the vein to the esophageal varices. Some residual mural thrombus in the central splenic vein and portosplenic confluence , good flow through the TIPS stent with no stenosis or thrombus. Prolonged dilatation of the stenosis in the central splenic vein and portal splenic confluence was performed with the 10 mm balloon. Subsequent venography demonstrates continued good flow through the splenic vein and portosplenic confluence, wide patency of the TIPS stent, no significant stenosis. Mild residual clot in the central splenic vein. Continued opacification of posterior gastric vein. Pressure measurements were obtained: Right atrium mean 16 mmHg, portal vein 20 mmHg mean, portosystemic gradient 4 mm Hg final. All wires, catheters and sheaths were removed from the patient. Hemostasis was achieved at the right IJ access site with manual compression. A dressing was placed. Dr. Markus Daft assisted with the latter portion of the procedure. The patient tolerated the procedure well. Patient transferred to the PACU. IMPRESSION: 1. Technically successful TIPS creation with decrease in mean portosystemic pressure from 15 to 4 mmHg. 2. Technically successful coil embolization of esophageal varices. 3. Ultrasound guided paracentesis removing 1.5 L clear yellow ascites.  Electronically Signed   By: Lucrezia Europe M.D.   On: 04/09/2019 08:47   Ir Paracentesis  Result Date: 04/09/2019 CLINICAL DATA:  Cirrhosis, portal venous hypertension, large esophageal varices, recurrent bleeding despite aggressive endoscopic treatment. EXAM: 1. TIPS CREATION 2. ULTRASOUND-GUIDED VENOUS ACCESS 3. PARACENTESIS WITH ULTRASOUND GUIDANCE ANESTHESIA/SEDATION: General - as administered by the Anesthesia department MEDICATIONS: No periprocedural antibiotics were indicated FLUOROSCOPY TIME:  67 min , 24 sec; 798 mGy COMPLICATIONS: None immediate. PROCEDURE: Informed written consent was obtained from the patient after a thorough discussion of the procedural risks, benefits and alternatives. All questions were addressed. See previous consultation. Maximal Sterile Barrier Technique was utilized including caps, mask, sterile gowns, sterile gloves, sterile drape, hand hygiene and skin antiseptic. A timeout was performed prior to the initiation of the procedure. Patient was placed under general anesthetic by department of anesthesia personnel. The skin overlying the right upper abdominal quadrant as well as the right neck and femoral region were prepped and draped in usual sterile fashion. Initial ultrasound scanning demonstrates a moderate amount of recurrent perihepatic ascites. Under ultrasound guidance, a 6 [place] Safe-T-Centesis needle was advanced into the peritoneal cavity from a right lateral approach for paracentesis removing 1.5 L clear yellow ascites. Under direct ultrasound guidance, a peripheral aspect of the right portal vein was accessed with a 21 gauge micropuncture needle. Ultrasound image was saved for procedural documentation purposes. Guidewire advanced into the main portal vein. Micropuncture transitional dilator was advanced over the guidewire, with the radiopaque transition positioned at the target entrance to the peripheral  right portal vein. Next, the right internal jugular vein was  accessed under direct ultrasound. Ultrasound image was saved for procedural documentation purposes. This allowed for placement of the 10 French TIPS vascular sheath. With the aid of angiographic guidewires, an MPA catheter was utilized to select the right hepatic vein and a hepatic venogram was performed.  Sheath advanced into the right hepatic vei for n. Under fluoroscopic guidance, the right portal vein with targeted with a Rsch-Uchida TIPS needle directed at the radiopaque target within the right portal vein. Ultimately, the right portal vein was accessed at a desirable location allowing advancement of a stiff Glidewire into the main portal vein. A 4 French glide catheter was advanced over the stiff Glidewire and a portal venogram was performed. Pressure measurements were obtained: Right atrium mean 21 mmHg, portal vein 36 mmHg; portosystemic gradient 15 mmHg preprocedure. Next, as there was difficulty advancing a measuring catheter through the intrahepatic track, the track was dilated with a 19m Mustang balloon. Measurements were taken for pre deployment planning. Subsequently a 7 mm Conquest balloon was used through the tract using a stiff Amplatz guidewire, allowing advancement of the sheath into the main portal vein. A portal venogram was performed. Tract further dilated with the 7 mm Conquest balloon to facilitate advancement of the sheath into the main portal vein. The percutaneous portal microcatheter and wire were removed. Next, a 2 cm (un covered) x 8 cm (covered) x 10 mm diameter GORE VIATORR TIPS Endoprosthesis was advanced through the sheath across the intra hepatic track and deployed. The TIPS stent was angioplastied in multiple stations to 10 mm diameter. Follow-up portal venogram demonstrated good stent deployment, with little antegrade flow through the portal system and stent, primary outflow via the dilated branch 2 esophageal varices. Some intraluminal thrombus was evident. The branch to the  esophageal varices was selectively catheterized with a 5 [place] catheter. The vein was embolized with 8 and 6 mm interlock 035 coils. The 10 mm mustang balloon was then used to macerate the thrombus in the central aspect of the splenic vein through the main portal vein and TIPS stent. Subsequent portal venography demonstrates occlusion of the vein to the esophageal varices. Some residual mural thrombus in the central splenic vein and portosplenic confluence , good flow through the TIPS stent with no stenosis or thrombus. Prolonged dilatation of the stenosis in the central splenic vein and portal splenic confluence was performed with the 10 mm balloon. Subsequent venography demonstrates continued good flow through the splenic vein and portosplenic confluence, wide patency of the TIPS stent, no significant stenosis. Mild residual clot in the central splenic vein. Continued opacification of posterior gastric vein. Pressure measurements were obtained: Right atrium mean 16 mmHg, portal vein 20 mmHg mean, portosystemic gradient 4 mm Hg final. All wires, catheters and sheaths were removed from the patient. Hemostasis was achieved at the right IJ access site with manual compression. A dressing was placed. Dr. AMarkus Daftassisted with the latter portion of the procedure. The patient tolerated the procedure well. Patient transferred to the PACU. IMPRESSION: 1. Technically successful TIPS creation with decrease in mean portosystemic pressure from 15 to 4 mmHg. 2. Technically successful coil embolization of esophageal varices. 3. Ultrasound guided paracentesis removing 1.5 L clear yellow ascites. Electronically Signed   By: DLucrezia EuropeM.D.   On: 04/09/2019 08:47        Scheduled Meds:  Chlorhexidine Gluconate Cloth  6 each Topical Daily   [START ON 04/11/2019] pantoprazole  40 mg Intravenous Q12H   sucralfate  1 g Oral TID WC & HS   Continuous Infusions:  sodium chloride     cefTRIAXone (ROCEPHIN)  IV Stopped  (04/09/19 1316)   dextrose 5 % and 0.45% NaCl     insulin     octreotide  (SANDOSTATIN)    IV infusion 50 mcg/hr (04/09/19 1800)   pantoprozole (PROTONIX) infusion 8 mg/hr (04/09/19 1800)   potassium chloride     sodium chloride       LOS: 3 days    Time spent: 63mns    JKathie Dike MD [facility]   If 7PM-7AM, please contact night-coverage [email or web address]  04/09/2019, 9:22 PM

## 2019-04-09 NOTE — Progress Notes (Signed)
Progress Note   Subjective  Patient is s/p TIPS per IR yesterday. She's done well since the procedure. No further bleeding, stable H/H. Husband at bedside. No pain   Objective   Vital signs in last 24 hours: Temp:  [97.5 F (36.4 C)-98.3 F (36.8 C)] 97.6 F (36.4 C) (12/01 0752) Pulse Rate:  [70-149] 92 (12/01 1100) Resp:  [16-34] 17 (12/01 1100) BP: (95-183)/(48-72) 103/59 (12/01 1100) SpO2:  [86 %-100 %] 100 % (12/01 1100) Arterial Line BP: (122-326)/(44-307) 122/57 (12/01 1100) Weight:  [68.9 kg] 68.9 kg (12/01 0340) Last BM Date: 04/08/19 General:    white female in NAD Heart:  Regular rate and rhythm; no murmurs Lungs: Respirations even and unlabored, lungs CTA bilaterally Abdomen:  Soft, nontender and nondistended.  Extremities:  Trace LE edema. Neurologic:  Alert and oriented,  grossly normal neurologically. No asterixis Psych:  Cooperative. Normal mood and affect.  Intake/Output from previous day: 11/30 0701 - 12/01 0700 In: 2699 [I.V.:2699] Out: 24 [Urine:810; Blood:20] Intake/Output this shift: Total I/O In: 670.5 [P.O.:480; I.V.:190.5] Out: 300 [Urine:300]  Lab Results: Recent Labs    04/08/19 0608  04/08/19 2000 04/08/19 2342 04/09/19 0316  WBC 5.2  --   --  19.9* 20.4*  HGB 11.1*   < > 13.7 14.3 14.4  HCT 34.1*   < > 42.0 46.8* 46.3*  PLT 93*  --   --  138* 134*   < > = values in this interval not displayed.   BMET Recent Labs    04/08/19 0608 04/08/19 2342 04/09/19 0316  NA 140 142 142  K 3.9 4.7 4.5  CL 109 109 111  CO2 24 9* <7*  GLUCOSE 115* 184* 242*  BUN 16 18 18   CREATININE 1.09* 1.50* 1.40*  CALCIUM 8.8* 8.6* 8.6*   LFT Recent Labs    04/07/19 0350  PROT 6.0*  ALBUMIN 2.4*  AST 36  ALT 29  ALKPHOS 71  BILITOT 1.3*   PT/INR Recent Labs    04/07/19 0350  LABPROT 15.4*  INR 1.2    Studies/Results: Ir Angiogram Selective Each Additional Vessel  Result Date: 04/09/2019 CLINICAL DATA:  Cirrhosis, portal  venous hypertension, large esophageal varices, recurrent bleeding despite aggressive endoscopic treatment. EXAM: 1. TIPS CREATION 2. ULTRASOUND-GUIDED VENOUS ACCESS 3. PARACENTESIS WITH ULTRASOUND GUIDANCE ANESTHESIA/SEDATION: General - as administered by the Anesthesia department MEDICATIONS: No periprocedural antibiotics were indicated FLUOROSCOPY TIME:  67 min , 24 sec; 329 mGy COMPLICATIONS: None immediate. PROCEDURE: Informed written consent was obtained from the patient after a thorough discussion of the procedural risks, benefits and alternatives. All questions were addressed. See previous consultation. Maximal Sterile Barrier Technique was utilized including caps, mask, sterile gowns, sterile gloves, sterile drape, hand hygiene and skin antiseptic. A timeout was performed prior to the initiation of the procedure. Patient was placed under general anesthetic by department of anesthesia personnel. The skin overlying the right upper abdominal quadrant as well as the right neck and femoral region were prepped and draped in usual sterile fashion. Initial ultrasound scanning demonstrates a moderate amount of recurrent perihepatic ascites. Under ultrasound guidance, a 6 [place] Safe-T-Centesis needle was advanced into the peritoneal cavity from a right lateral approach for paracentesis removing 1.5 L clear yellow ascites. Under direct ultrasound guidance, a peripheral aspect of the right portal vein was accessed with a 21 gauge micropuncture needle. Ultrasound image was saved for procedural documentation purposes. Guidewire advanced into the main portal vein. Micropuncture transitional dilator was advanced  over the guidewire, with the radiopaque transition positioned at the target entrance to the peripheral right portal vein. Next, the right internal jugular vein was accessed under direct ultrasound. Ultrasound image was saved for procedural documentation purposes. This allowed for placement of the 10 French TIPS  vascular sheath. With the aid of angiographic guidewires, an MPA catheter was utilized to select the right hepatic vein and a hepatic venogram was performed.  Sheath advanced into the right hepatic vei for n. Under fluoroscopic guidance, the right portal vein with targeted with a Rsch-Uchida TIPS needle directed at the radiopaque target within the right portal vein. Ultimately, the right portal vein was accessed at a desirable location allowing advancement of a stiff Glidewire into the main portal vein. A 4 French glide catheter was advanced over the stiff Glidewire and a portal venogram was performed. Pressure measurements were obtained: Right atrium mean 21 mmHg, portal vein 36 mmHg; portosystemic gradient 15 mmHg preprocedure. Next, as there was difficulty advancing a measuring catheter through the intrahepatic track, the track was dilated with a 75m Mustang balloon. Measurements were taken for pre deployment planning. Subsequently a 7 mm Conquest balloon was used through the tract using a stiff Amplatz guidewire, allowing advancement of the sheath into the main portal vein. A portal venogram was performed. Tract further dilated with the 7 mm Conquest balloon to facilitate advancement of the sheath into the main portal vein. The percutaneous portal microcatheter and wire were removed. Next, a 2 cm (un covered) x 8 cm (covered) x 10 mm diameter GORE VIATORR TIPS Endoprosthesis was advanced through the sheath across the intra hepatic track and deployed. The TIPS stent was angioplastied in multiple stations to 10 mm diameter. Follow-up portal venogram demonstrated good stent deployment, with little antegrade flow through the portal system and stent, primary outflow via the dilated branch 2 esophageal varices. Some intraluminal thrombus was evident. The branch to the esophageal varices was selectively catheterized with a 5 [place] catheter. The vein was embolized with 8 and 6 mm interlock 035 coils. The 10 mm  mustang balloon was then used to macerate the thrombus in the central aspect of the splenic vein through the main portal vein and TIPS stent. Subsequent portal venography demonstrates occlusion of the vein to the esophageal varices. Some residual mural thrombus in the central splenic vein and portosplenic confluence , good flow through the TIPS stent with no stenosis or thrombus. Prolonged dilatation of the stenosis in the central splenic vein and portal splenic confluence was performed with the 10 mm balloon. Subsequent venography demonstrates continued good flow through the splenic vein and portosplenic confluence, wide patency of the TIPS stent, no significant stenosis. Mild residual clot in the central splenic vein. Continued opacification of posterior gastric vein. Pressure measurements were obtained: Right atrium mean 16 mmHg, portal vein 20 mmHg mean, portosystemic gradient 4 mm Hg final. All wires, catheters and sheaths were removed from the patient. Hemostasis was achieved at the right IJ access site with manual compression. A dressing was placed. Dr. AMarkus Daftassisted with the latter portion of the procedure. The patient tolerated the procedure well. Patient transferred to the PACU. IMPRESSION: 1. Technically successful TIPS creation with decrease in mean portosystemic pressure from 15 to 4 mmHg. 2. Technically successful coil embolization of esophageal varices. 3. Ultrasound guided paracentesis removing 1.5 L clear yellow ascites. Electronically Signed   By: DLucrezia EuropeM.D.   On: 04/09/2019 08:47   Ir Tips  Result Date: 04/09/2019 CLINICAL DATA:  Cirrhosis, portal venous hypertension, large esophageal varices, recurrent bleeding despite aggressive endoscopic treatment. EXAM: 1. TIPS CREATION 2. ULTRASOUND-GUIDED VENOUS ACCESS 3. PARACENTESIS WITH ULTRASOUND GUIDANCE ANESTHESIA/SEDATION: General - as administered by the Anesthesia department MEDICATIONS: No periprocedural antibiotics were indicated  FLUOROSCOPY TIME:  67 min , 24 sec; 063 mGy COMPLICATIONS: None immediate. PROCEDURE: Informed written consent was obtained from the patient after a thorough discussion of the procedural risks, benefits and alternatives. All questions were addressed. See previous consultation. Maximal Sterile Barrier Technique was utilized including caps, mask, sterile gowns, sterile gloves, sterile drape, hand hygiene and skin antiseptic. A timeout was performed prior to the initiation of the procedure. Patient was placed under general anesthetic by department of anesthesia personnel. The skin overlying the right upper abdominal quadrant as well as the right neck and femoral region were prepped and draped in usual sterile fashion. Initial ultrasound scanning demonstrates a moderate amount of recurrent perihepatic ascites. Under ultrasound guidance, a 6 [place] Safe-T-Centesis needle was advanced into the peritoneal cavity from a right lateral approach for paracentesis removing 1.5 L clear yellow ascites. Under direct ultrasound guidance, a peripheral aspect of the right portal vein was accessed with a 21 gauge micropuncture needle. Ultrasound image was saved for procedural documentation purposes. Guidewire advanced into the main portal vein. Micropuncture transitional dilator was advanced over the guidewire, with the radiopaque transition positioned at the target entrance to the peripheral right portal vein. Next, the right internal jugular vein was accessed under direct ultrasound. Ultrasound image was saved for procedural documentation purposes. This allowed for placement of the 10 French TIPS vascular sheath. With the aid of angiographic guidewires, an MPA catheter was utilized to select the right hepatic vein and a hepatic venogram was performed.  Sheath advanced into the right hepatic vei for n. Under fluoroscopic guidance, the right portal vein with targeted with a Rsch-Uchida TIPS needle directed at the radiopaque target  within the right portal vein. Ultimately, the right portal vein was accessed at a desirable location allowing advancement of a stiff Glidewire into the main portal vein. A 4 French glide catheter was advanced over the stiff Glidewire and a portal venogram was performed. Pressure measurements were obtained: Right atrium mean 21 mmHg, portal vein 36 mmHg; portosystemic gradient 15 mmHg preprocedure. Next, as there was difficulty advancing a measuring catheter through the intrahepatic track, the track was dilated with a 10m Mustang balloon. Measurements were taken for pre deployment planning. Subsequently a 7 mm Conquest balloon was used through the tract using a stiff Amplatz guidewire, allowing advancement of the sheath into the main portal vein. A portal venogram was performed. Tract further dilated with the 7 mm Conquest balloon to facilitate advancement of the sheath into the main portal vein. The percutaneous portal microcatheter and wire were removed. Next, a 2 cm (un covered) x 8 cm (covered) x 10 mm diameter GORE VIATORR TIPS Endoprosthesis was advanced through the sheath across the intra hepatic track and deployed. The TIPS stent was angioplastied in multiple stations to 10 mm diameter. Follow-up portal venogram demonstrated good stent deployment, with little antegrade flow through the portal system and stent, primary outflow via the dilated branch 2 esophageal varices. Some intraluminal thrombus was evident. The branch to the esophageal varices was selectively catheterized with a 5 [place] catheter. The vein was embolized with 8 and 6 mm interlock 035 coils. The 10 mm mustang balloon was then used to macerate the thrombus in the central aspect of the splenic vein through the  main portal vein and TIPS stent. Subsequent portal venography demonstrates occlusion of the vein to the esophageal varices. Some residual mural thrombus in the central splenic vein and portosplenic confluence , good flow through the  TIPS stent with no stenosis or thrombus. Prolonged dilatation of the stenosis in the central splenic vein and portal splenic confluence was performed with the 10 mm balloon. Subsequent venography demonstrates continued good flow through the splenic vein and portosplenic confluence, wide patency of the TIPS stent, no significant stenosis. Mild residual clot in the central splenic vein. Continued opacification of posterior gastric vein. Pressure measurements were obtained: Right atrium mean 16 mmHg, portal vein 20 mmHg mean, portosystemic gradient 4 mm Hg final. All wires, catheters and sheaths were removed from the patient. Hemostasis was achieved at the right IJ access site with manual compression. A dressing was placed. Dr. Markus Daft assisted with the latter portion of the procedure. The patient tolerated the procedure well. Patient transferred to the PACU. IMPRESSION: 1. Technically successful TIPS creation with decrease in mean portosystemic pressure from 15 to 4 mmHg. 2. Technically successful coil embolization of esophageal varices. 3. Ultrasound guided paracentesis removing 1.5 L clear yellow ascites. Electronically Signed   By: Lucrezia Europe M.D.   On: 04/09/2019 08:47   [place] Guide Roadmapping  Result Date: 04/09/2019 CLINICAL DATA:  Cirrhosis, portal venous hypertension, large esophageal varices, recurrent bleeding despite aggressive endoscopic treatment. EXAM: 1. TIPS CREATION 2. ULTRASOUND-GUIDED VENOUS ACCESS 3. PARACENTESIS WITH ULTRASOUND GUIDANCE ANESTHESIA/SEDATION: General - as administered by the Anesthesia department MEDICATIONS: No periprocedural antibiotics were indicated FLUOROSCOPY TIME:  67 min , 24 sec; 194 mGy COMPLICATIONS: None immediate. PROCEDURE: Informed written consent was obtained from the patient after a thorough discussion of the procedural risks, benefits and alternatives. All questions were addressed. See previous consultation. Maximal  Sterile Barrier Technique was utilized including caps, mask, sterile gowns, sterile gloves, sterile drape, hand hygiene and skin antiseptic. A timeout was performed prior to the initiation of the procedure. Patient was placed under general anesthetic by department of anesthesia personnel. The skin overlying the right upper abdominal quadrant as well as the right neck and femoral region were prepped and draped in usual sterile fashion. Initial ultrasound scanning demonstrates a moderate amount of recurrent perihepatic ascites. Under ultrasound guidance, a 6 [place] Safe-T-Centesis needle was advanced into the peritoneal cavity from a right lateral approach for paracentesis removing 1.5 L clear yellow ascites. Under direct ultrasound guidance, a peripheral aspect of the right portal vein was accessed with a 21 gauge micropuncture needle. Ultrasound image was saved for procedural documentation purposes. Guidewire advanced into the main portal vein. Micropuncture transitional dilator was advanced over the guidewire, with the radiopaque transition positioned at the target entrance to the peripheral right portal vein. Next, the right internal jugular vein was accessed under direct ultrasound. Ultrasound image was saved for procedural documentation purposes. This allowed for placement of the 10 French TIPS vascular sheath. With the aid of angiographic guidewires, an MPA catheter was utilized to select the right hepatic vein and a hepatic venogram was performed.  Sheath advanced into the right hepatic vei for n. Under fluoroscopic guidance, the right portal vein with targeted with a Rsch-Uchida TIPS needle directed at the radiopaque target within the right portal vein. Ultimately, the right portal vein was accessed at a desirable location allowing advancement of a stiff Glidewire into the main portal vein. A 4 French glide catheter was advanced over  the stiff Glidewire and a portal venogram was performed. Pressure  measurements were obtained: Right atrium mean 21 mmHg, portal vein 36 mmHg; portosystemic gradient 15 mmHg preprocedure. Next, as there was difficulty advancing a measuring catheter through the intrahepatic track, the track was dilated with a 43m Mustang balloon. Measurements were taken for pre deployment planning. Subsequently a 7 mm Conquest balloon was used through the tract using a stiff Amplatz guidewire, allowing advancement of the sheath into the main portal vein. A portal venogram was performed. Tract further dilated with the 7 mm Conquest balloon to facilitate advancement of the sheath into the main portal vein. The percutaneous portal microcatheter and wire were removed. Next, a 2 cm (un covered) x 8 cm (covered) x 10 mm diameter GORE VIATORR TIPS Endoprosthesis was advanced through the sheath across the intra hepatic track and deployed. The TIPS stent was angioplastied in multiple stations to 10 mm diameter. Follow-up portal venogram demonstrated good stent deployment, with little antegrade flow through the portal system and stent, primary outflow via the dilated branch 2 esophageal varices. Some intraluminal thrombus was evident. The branch to the esophageal varices was selectively catheterized with a 5 [place] catheter. The vein was embolized with 8 and 6 mm interlock 035 coils. The 10 mm mustang balloon was then used to macerate the thrombus in the central aspect of the splenic vein through the main portal vein and TIPS stent. Subsequent portal venography demonstrates occlusion of the vein to the esophageal varices. Some residual mural thrombus in the central splenic vein and portosplenic confluence , good flow through the TIPS stent with no stenosis or thrombus. Prolonged dilatation of the stenosis in the central splenic vein and portal splenic confluence was performed with the 10 mm balloon. Subsequent venography demonstrates continued good flow through the splenic vein and portosplenic  confluence, wide patency of the TIPS stent, no significant stenosis. Mild residual clot in the central splenic vein. Continued opacification of posterior gastric vein. Pressure measurements were obtained: Right atrium mean 16 mmHg, portal vein 20 mmHg mean, portosystemic gradient 4 mm Hg final. All wires, catheters and sheaths were removed from the patient. Hemostasis was achieved at the right IJ access site with manual compression. A dressing was placed. Dr. AMarkus Daftassisted with the latter portion of the procedure. The patient tolerated the procedure well. Patient transferred to the PACU. IMPRESSION: 1. Technically successful TIPS creation with decrease in mean portosystemic pressure from 15 to 4 mmHg. 2. Technically successful coil embolization of esophageal varices. 3. Ultrasound guided paracentesis removing 1.5 L clear yellow ascites. Electronically Signed   By: DLucrezia EuropeM.D.   On: 04/09/2019 08:47   Ir Paracentesis  Result Date: 04/09/2019 CLINICAL DATA:  Cirrhosis, portal venous hypertension, large esophageal varices, recurrent bleeding despite aggressive endoscopic treatment. EXAM: 1. TIPS CREATION 2. ULTRASOUND-GUIDED VENOUS ACCESS 3. PARACENTESIS WITH ULTRASOUND GUIDANCE ANESTHESIA/SEDATION: General - as administered by the Anesthesia department MEDICATIONS: No periprocedural antibiotics were indicated FLUOROSCOPY TIME:  67 min , 24 sec; 4622mGy COMPLICATIONS: None immediate. PROCEDURE: Informed written consent was obtained from the patient after a thorough discussion of the procedural risks, benefits and alternatives. All questions were addressed. See previous consultation. Maximal Sterile Barrier Technique was utilized including caps, mask, sterile gowns, sterile gloves, sterile drape, hand hygiene and skin antiseptic. A timeout was performed prior to the initiation of the procedure. Patient was placed under general anesthetic by department of anesthesia personnel. The skin overlying the right  upper abdominal quadrant as well  as the right neck and femoral region were prepped and draped in usual sterile fashion. Initial ultrasound scanning demonstrates a moderate amount of recurrent perihepatic ascites. Under ultrasound guidance, a 6 [place] Safe-T-Centesis needle was advanced into the peritoneal cavity from a right lateral approach for paracentesis removing 1.5 L clear yellow ascites. Under direct ultrasound guidance, a peripheral aspect of the right portal vein was accessed with a 21 gauge micropuncture needle. Ultrasound image was saved for procedural documentation purposes. Guidewire advanced into the main portal vein. Micropuncture transitional dilator was advanced over the guidewire, with the radiopaque transition positioned at the target entrance to the peripheral right portal vein. Next, the right internal jugular vein was accessed under direct ultrasound. Ultrasound image was saved for procedural documentation purposes. This allowed for placement of the 10 French TIPS vascular sheath. With the aid of angiographic guidewires, an MPA catheter was utilized to select the right hepatic vein and a hepatic venogram was performed.  Sheath advanced into the right hepatic vei for n. Under fluoroscopic guidance, the right portal vein with targeted with a Rsch-Uchida TIPS needle directed at the radiopaque target within the right portal vein. Ultimately, the right portal vein was accessed at a desirable location allowing advancement of a stiff Glidewire into the main portal vein. A 4 French glide catheter was advanced over the stiff Glidewire and a portal venogram was performed. Pressure measurements were obtained: Right atrium mean 21 mmHg, portal vein 36 mmHg; portosystemic gradient 15 mmHg preprocedure. Next, as there was difficulty advancing a measuring catheter through the intrahepatic track, the track was dilated with a 72m Mustang balloon. Measurements were taken for pre deployment planning. Subsequently  a 7 mm Conquest balloon was used through the tract using a stiff Amplatz guidewire, allowing advancement of the sheath into the main portal vein. A portal venogram was performed. Tract further dilated with the 7 mm Conquest balloon to facilitate advancement of the sheath into the main portal vein. The percutaneous portal microcatheter and wire were removed. Next, a 2 cm (un covered) x 8 cm (covered) x 10 mm diameter GORE VIATORR TIPS Endoprosthesis was advanced through the sheath across the intra hepatic track and deployed. The TIPS stent was angioplastied in multiple stations to 10 mm diameter. Follow-up portal venogram demonstrated good stent deployment, with little antegrade flow through the portal system and stent, primary outflow via the dilated branch 2 esophageal varices. Some intraluminal thrombus was evident. The branch to the esophageal varices was selectively catheterized with a 5 [place] catheter. The vein was embolized with 8 and 6 mm interlock 035 coils. The 10 mm mustang balloon was then used to macerate the thrombus in the central aspect of the splenic vein through the main portal vein and TIPS stent. Subsequent portal venography demonstrates occlusion of the vein to the esophageal varices. Some residual mural thrombus in the central splenic vein and portosplenic confluence , good flow through the TIPS stent with no stenosis or thrombus. Prolonged dilatation of the stenosis in the central splenic vein and portal splenic confluence was performed with the 10 mm balloon. Subsequent venography demonstrates continued good flow through the splenic vein and portosplenic confluence, wide patency of the TIPS stent, no significant stenosis. Mild residual clot in the central splenic vein. Continued opacification of posterior gastric vein. Pressure measurements were obtained: Right atrium mean 16 mmHg, portal vein 20 mmHg mean, portosystemic gradient 4 mm Hg final. All wires, catheters and sheaths were  removed from the patient. Hemostasis was achieved at  the right IJ access site with manual compression. A dressing was placed. Dr. Markus Daft assisted with the latter portion of the procedure. The patient tolerated the procedure well. Patient transferred to the PACU. IMPRESSION: 1. Technically successful TIPS creation with decrease in mean portosystemic pressure from 15 to 4 mmHg. 2. Technically successful coil embolization of esophageal varices. 3. Ultrasound guided paracentesis removing 1.5 L clear yellow ascites. Electronically Signed   By: Lucrezia Europe M.D.   On: 04/09/2019 08:47       Assessment / Plan:   73 y/o female with decompensated cirrhosis, history of large esophageal varices despite nadolol banded x 14 in recent weeks, history of ascites, came in with odynophagia and had an EGD with Dr. Collene Mares that was complicated by severe bleeding that was difficult to control. Yesterday she had persistent blood per rectum and IR reconsulted for consideration of TIPS. This appears to have stopped the bleeding, she looks good today, no further bleeding symptoms, Hgb stable. Will need to monitor renal function, fluid shifts, and mental status for development of encephalopathy. Would have low threshold to add lactulose post TIPS.   Would continue IV octreotide drip for another day, continue IV protonix, she is on clear liquid diet. I have added ceftriaxone for SBP prophylaxis given her recent bleeding and stopped the nadolol given recent bleeding and recent TIPS. I would keep her on clear liquid diet today and advance to full liquid tomorrow if she continues to do well.   Call with questions, will continue to follow.  [place] Cellar, MD [facility]

## 2019-04-09 NOTE — Progress Notes (Signed)
Referring Physician(s): Armbruster  Supervising Physician: Markus Daft  Patient Status:  [facility] - In-pt  Chief Complaint:  Bleeding esophogeal varices Ascites  Brief History:  [name] is a 73 y.o. female with hx of NASH cirrhosis and known esophageal varices with recurrent bleeding.  On 04/07/19 IR was called from Endoscopy by Dr. Collene Mares due to continued bleeding and possible need for TIPS procedure if not able to achieve hemostasis.  Dr. Watt Climes assisted in EGD in was able to clip, inject, and band multiple varices with cessation of bleeding.  She continued to pass maroon stools so she underwent urgent TIPS by Dr. Vernard Gambles on 04/08/19   Subjective:  [name] is doing well this morning. She is convinced that she did NOT have the procedure done.   I showed her the dressing on her RUQ and she said "What?? But, I told them I didn't want it done because I was afraid I would die".  She is alert and oriented, no apparent encephalopathy. She answered all my questioned appropriately.  Allergies: Kiwi extract, Tdap [tetanus-diphth-acell pertussis], Statins, Latex, and Tramadol  Medications: Prior to Admission medications   Medication Sig Start Date End Date Taking? Authorizing Provider  acetaminophen (TYLENOL) 500 MG tablet Take 500 mg by mouth at bedtime.    Yes [provider]  Aromatic Inhalants (VICKS VAPOR IN) Vicks Vapor Rub apply small amount to outside of nose to help breathing   Yes [provider]  aspirin EC 81 MG tablet Take 1 tablet (81 mg total) by mouth daily. 01/16/17  Yes Josue Hector, MD  Biotin 10000 MCG TABS Take 10,000 mcg by mouth every morning.   Yes [provider]  calcium carbonate (OS-CAL) 600 MG TABS Take 600 mg by mouth 2 (two) times daily with a meal.     Yes [provider]  Cholecalciferol (VITAMIN D) 50 MCG (2000 UT) CAPS Take 2,000 Units by mouth daily.    Yes [provider]  Cyanocobalamin  (VITAMIN B 12 PO) Take 1,000 mcg by mouth daily.     Yes [provider]  empagliflozin (JARDIANCE) 25 MG TABS tablet Take 25 mg by mouth daily. 01/31/19  Yes Reed, Tiffany L, DO  ezetimibe (ZETIA) 10 MG tablet TAKE 1 TABLET(10 MG) BY MOUTH DAILY Patient taking differently: Take 10 mg by mouth daily.  12/26/18  Yes Reed, Tiffany L, DO  furosemide (LASIX) 40 MG tablet TAKE 1 TABLET(40 MG) BY MOUTH DAILY Patient taking differently: Take 40 mg by mouth daily.  07/23/18  Yes Josue Hector, MD  insulin detemir (LEVEMIR) 100 UNIT/ML injection Inject 0.25 mLs (25 Units total) into the skin at bedtime. Decrease dose from 48 units to 25 units until diet further advanced.  Then may resume home dose. 04/03/19  Yes [place] Eric J, DO  lidocaine (XYLOCAINE) 2 % solution Use as directed 15 mLs in the mouth or throat 3 (three) times daily before meals for 14 days. 04/03/19 04/17/19 Yes [place] Eric J, DO  loratadine (CLARITIN) 10 MG tablet Take 10 mg by mouth daily as needed for allergies.   Yes [provider]  MAGNESIUM PO Take 500 mg by mouth 2 (two) times daily in the am and at bedtime..   Yes [provider]  Multiple Vitamins-Minerals (MULTIVITAMIN WITH MINERALS) tablet Take 1 tablet by mouth daily.     Yes [provider]  nadolol (CORGARD) 40 MG tablet Take 1 tablet (40 mg total) by mouth daily.  02/28/19  Yes Reed, Tiffany L, DO  NOVOLOG FLEXPEN 100 UNIT/ML FlexPen Inject 12 units under the skin every morning, 8 units at lunch and 12 units at supper Patient taking differently: Inject 8-12 Units into the skin See admin instructions. Inject 12 units under the skin every morning, 8 units at lunch and 12 units at supper 02/19/19  Yes Reed, Tiffany L, DO  pantoprazole (PROTONIX) 40 MG tablet Take 1 tablet (40 mg total) by mouth daily. 04/03/19 06/02/19 Yes [place] Eric J, DO  Polyethyl Glycol-Propyl Glycol (SYSTANE OP) Place 1 drop into both eyes 2 (two) times daily.   Yes  [provider]  potassium chloride SA (KLOR-CON) 20 MEQ tablet Take 1 tablet (20 mEq total) by mouth daily. 03/11/19  Yes Reed, Tiffany L, DO  Probiotic Product (PROBIOTIC DAILY PO) Take 1 capsule by mouth daily. Digestive Advantage Probiotic   Yes [provider]  spironolactone (ALDACTONE) 100 MG tablet TAKE 1 TABLET(100 MG) BY MOUTH TWICE DAILY Patient taking differently: Take 100 mg by mouth 2 (two) times daily.  09/10/18  Yes [name], MD  sucralfate (CARAFATE) 1 GM/10ML suspension Take 10 mLs (1 g total) by mouth 4 (four) times daily -  with meals and at bedtime for 14 days. 04/03/19 04/17/19 Yes [place] Donnamarie Poag, DO  BD PEN NEEDLE NANO U/F 32G X 4 MM MISC USE THREE TIMES DAILY AS DIRECTED 11/26/18   Reed, Tiffany L, DO  glucose blood test strip One Touch Ultra II strips. Use to test blood sugar three times daily. Dx: E11.65 06/01/17   Reed, Tiffany L, DO  HYDROcodone-acetaminophen (HYCET) 7.5-325 mg/15 ml solution Take 15 mLs by mouth every 6 (six) hours as needed for up to 10 days for moderate pain. Patient not taking: Reported on 04/06/2019 04/03/19 04/13/19  [place] Eric J, DO  Insulin Syringe-Needle U-100 (INSULIN SYRINGE 1CC/31GX5/16") 31G X 5/16" 1 ML MISC USE AS DIRECTED DAILY WITH LEVEMIR 07/27/18   Reed, Tiffany L, DO     Vital Signs: BP (!) 115/56 (BP Location: Left Leg)    Pulse (!) 109    Temp 97.6 F (36.4 C) (Oral)    Resp 20    Ht 5' 3"  (1.6 m)    Wt 68.9 kg    SpO2 100%    BMI 26.91 kg/m   Physical Exam Vitals signs reviewed.  Constitutional:      Appearance: Normal appearance.  Eyes:     Extraocular Movements: Extraocular movements intact.  Cardiovascular:     Rate and Rhythm: Normal rate.  Pulmonary:     Effort: Pulmonary effort is normal. No respiratory distress.  Abdominal:     General: There is no distension.     Palpations: Abdomen is soft.     Tenderness: There is no abdominal tenderness.     Comments: RUQ stick site looks good    Neurological:     General: No focal deficit present.     Mental Status: She is alert and oriented to person, place, and time.  Psychiatric:        Mood and Affect: Mood normal.     Imaging: Ct Abdomen Pelvis W Contrast  Result Date: 04/06/2019 CLINICAL DATA:  Patient with vomiting, diarrhea and right upper quadrant pain. Status post esophageal band. EXAM: CT ABDOMEN AND PELVIS WITH CONTRAST TECHNIQUE: Multidetector CT imaging of the abdomen and pelvis was performed using the standard protocol following bolus administration of intravenous contrast. CONTRAST:  152m OMNIPAQUE IOHEXOL 300 MG/ML  SOLN COMPARISON:  MRI abdomen 04/07/2017 FINDINGS: Lower chest: Normal heart size. Dependent atelectasis within the bilateral lower lobes. No pleural effusion. Hepatobiliary: Morphologic changes to the liver compatible with cirrhosis. Heterogeneous attenuation of the liver. Mild gallbladder wall thickening, likely secondary to cirrhosis. No definite intrahepatic or extrahepatic biliary ductal dilatation. The portal vein is attenuated centrally (image 22; series 3) most compatible thrombus. Pancreas: Unremarkable Spleen: Enlarged and heterogeneous in attenuation. And like low-attenuation centrally. Splenic infarct not entirely excluded. Adrenals/Urinary Tract: Normal adrenal glands. Kidneys enhance symmetrically with contrast. No hydronephrosis. Urinary bladder is unremarkable. Stomach/Bowel: There is circumferential wall thickening of the cecum and ascending colon. No evidence for small bowel obstruction. Normal morphology of the stomach. Small amount of fluid within the small bowel mesentery. Paraesophageal varices. Vascular/Lymphatic: Normal caliber abdominal aorta. Peripheral calcified atherosclerotic plaque. No retroperitoneal lymphadenopathy. Reproductive: Status post hysterectomy. Other: Moderate volume ascites throughout the abdomen. Musculoskeletal: Lumbar spine degenerative changes. Lumbar spinal fusion  hardware. No aggressive or acute appearing osseous lesions. IMPRESSION: 1. The portal vein is attenuated centrally most compatible with portal venous thrombus which appears to have progressed from prior exam. 2. There is circumferential wall thickening of the cecum and ascending colon which may be secondary to portal venous hypertension or colitis. 3. Morphologic changes to the liver compatible with cirrhosis. Heterogeneous attenuation of the liver which may be secondary to cirrhosis. 4. Bandlike heterogeneity of the spleen raising the possibility of splenic infarct. 5. Moderate volume ascites throughout the abdomen and pelvis. 6. Paraesophageal varices. Electronically Signed   By: Lovey Newcomer M.D.   On: 04/06/2019 15:56   Ir Angiogram Selective Each Additional Vessel  Result Date: 04/09/2019 CLINICAL DATA:  Cirrhosis, portal venous hypertension, large esophageal varices, recurrent bleeding despite aggressive endoscopic treatment. EXAM: 1. TIPS CREATION 2. ULTRASOUND-GUIDED VENOUS ACCESS 3. PARACENTESIS WITH ULTRASOUND GUIDANCE ANESTHESIA/SEDATION: General - as administered by the Anesthesia department MEDICATIONS: No periprocedural antibiotics were indicated FLUOROSCOPY TIME:  67 min , 24 sec; 493 mGy COMPLICATIONS: None immediate. PROCEDURE: Informed written consent was obtained from the patient after a thorough discussion of the procedural risks, benefits and alternatives. All questions were addressed. See previous consultation. Maximal Sterile Barrier Technique was utilized including caps, mask, sterile gowns, sterile gloves, sterile drape, hand hygiene and skin antiseptic. A timeout was performed prior to the initiation of the procedure. Patient was placed under general anesthetic by department of anesthesia personnel. The skin overlying the right upper abdominal quadrant as well as the right neck and femoral region were prepped and draped in usual sterile fashion. Initial ultrasound scanning demonstrates a  moderate amount of recurrent perihepatic ascites. Under ultrasound guidance, a 6 [place] Safe-T-Centesis needle was advanced into the peritoneal cavity from a right lateral approach for paracentesis removing 1.5 L clear yellow ascites. Under direct ultrasound guidance, a peripheral aspect of the right portal vein was accessed with a 21 gauge micropuncture needle. Ultrasound image was saved for procedural documentation purposes. Guidewire advanced into the main portal vein. Micropuncture transitional dilator was advanced over the guidewire, with the radiopaque transition positioned at the target entrance to the peripheral right portal vein. Next, the right internal jugular vein was accessed under direct ultrasound. Ultrasound image was saved for procedural documentation purposes. This allowed for placement of the 10 French TIPS vascular sheath. With the aid of angiographic guidewires, an MPA catheter was utilized to select the right hepatic vein and a hepatic venogram was performed.  Sheath advanced into the right hepatic vei for n. Under fluoroscopic guidance,  the right portal vein with targeted with a Rsch-Uchida TIPS needle directed at the radiopaque target within the right portal vein. Ultimately, the right portal vein was accessed at a desirable location allowing advancement of a stiff Glidewire into the main portal vein. A 4 French glide catheter was advanced over the stiff Glidewire and a portal venogram was performed. Pressure measurements were obtained: Right atrium mean 21 mmHg, portal vein 36 mmHg; portosystemic gradient 15 mmHg preprocedure. Next, as there was difficulty advancing a measuring catheter through the intrahepatic track, the track was dilated with a 39m Mustang balloon. Measurements were taken for pre deployment planning. Subsequently a 7 mm Conquest balloon was used through the tract using a stiff Amplatz guidewire, allowing advancement of the sheath into the main portal vein. A portal  venogram was performed. Tract further dilated with the 7 mm Conquest balloon to facilitate advancement of the sheath into the main portal vein. The percutaneous portal microcatheter and wire were removed. Next, a 2 cm (un covered) x 8 cm (covered) x 10 mm diameter GORE VIATORR TIPS Endoprosthesis was advanced through the sheath across the intra hepatic track and deployed. The TIPS stent was angioplastied in multiple stations to 10 mm diameter. Follow-up portal venogram demonstrated good stent deployment, with little antegrade flow through the portal system and stent, primary outflow via the dilated branch 2 esophageal varices. Some intraluminal thrombus was evident. The branch to the esophageal varices was selectively catheterized with a 5 [place] catheter. The vein was embolized with 8 and 6 mm interlock 035 coils. The 10 mm mustang balloon was then used to macerate the thrombus in the central aspect of the splenic vein through the main portal vein and TIPS stent. Subsequent portal venography demonstrates occlusion of the vein to the esophageal varices. Some residual mural thrombus in the central splenic vein and portosplenic confluence , good flow through the TIPS stent with no stenosis or thrombus. Prolonged dilatation of the stenosis in the central splenic vein and portal splenic confluence was performed with the 10 mm balloon. Subsequent venography demonstrates continued good flow through the splenic vein and portosplenic confluence, wide patency of the TIPS stent, no significant stenosis. Mild residual clot in the central splenic vein. Continued opacification of posterior gastric vein. Pressure measurements were obtained: Right atrium mean 16 mmHg, portal vein 20 mmHg mean, portosystemic gradient 4 mm Hg final. All wires, catheters and sheaths were removed from the patient. Hemostasis was achieved at the right IJ access site with manual compression. A dressing was placed. Dr. AMarkus Daftassisted with the  latter portion of the procedure. The patient tolerated the procedure well. Patient transferred to the PACU. IMPRESSION: 1. Technically successful TIPS creation with decrease in mean portosystemic pressure from 15 to 4 mmHg. 2. Technically successful coil embolization of esophageal varices. 3. Ultrasound guided paracentesis removing 1.5 L clear yellow ascites. Electronically Signed   By: DLucrezia EuropeM.D.   On: 04/09/2019 08:47   Ir Tips  Result Date: 04/09/2019 CLINICAL DATA:  Cirrhosis, portal venous hypertension, large esophageal varices, recurrent bleeding despite aggressive endoscopic treatment. EXAM: 1. TIPS CREATION 2. ULTRASOUND-GUIDED VENOUS ACCESS 3. PARACENTESIS WITH ULTRASOUND GUIDANCE ANESTHESIA/SEDATION: General - as administered by the Anesthesia department MEDICATIONS: No periprocedural antibiotics were indicated FLUOROSCOPY TIME:  67 min , 24 sec; 4892mGy COMPLICATIONS: None immediate. PROCEDURE: Informed written consent was obtained from the patient after a thorough discussion of the procedural risks, benefits and alternatives. All questions were addressed. See previous consultation. Maximal Sterile  Barrier Technique was utilized including caps, mask, sterile gowns, sterile gloves, sterile drape, hand hygiene and skin antiseptic. A timeout was performed prior to the initiation of the procedure. Patient was placed under general anesthetic by department of anesthesia personnel. The skin overlying the right upper abdominal quadrant as well as the right neck and femoral region were prepped and draped in usual sterile fashion. Initial ultrasound scanning demonstrates a moderate amount of recurrent perihepatic ascites. Under ultrasound guidance, a 6 [place] Safe-T-Centesis needle was advanced into the peritoneal cavity from a right lateral approach for paracentesis removing 1.5 L clear yellow ascites. Under direct ultrasound guidance, a peripheral aspect of the right portal vein was accessed with a 21  gauge micropuncture needle. Ultrasound image was saved for procedural documentation purposes. Guidewire advanced into the main portal vein. Micropuncture transitional dilator was advanced over the guidewire, with the radiopaque transition positioned at the target entrance to the peripheral right portal vein. Next, the right internal jugular vein was accessed under direct ultrasound. Ultrasound image was saved for procedural documentation purposes. This allowed for placement of the 10 French TIPS vascular sheath. With the aid of angiographic guidewires, an MPA catheter was utilized to select the right hepatic vein and a hepatic venogram was performed.  Sheath advanced into the right hepatic vei for n. Under fluoroscopic guidance, the right portal vein with targeted with a Rsch-Uchida TIPS needle directed at the radiopaque target within the right portal vein. Ultimately, the right portal vein was accessed at a desirable location allowing advancement of a stiff Glidewire into the main portal vein. A 4 French glide catheter was advanced over the stiff Glidewire and a portal venogram was performed. Pressure measurements were obtained: Right atrium mean 21 mmHg, portal vein 36 mmHg; portosystemic gradient 15 mmHg preprocedure. Next, as there was difficulty advancing a measuring catheter through the intrahepatic track, the track was dilated with a 1m Mustang balloon. Measurements were taken for pre deployment planning. Subsequently a 7 mm Conquest balloon was used through the tract using a stiff Amplatz guidewire, allowing advancement of the sheath into the main portal vein. A portal venogram was performed. Tract further dilated with the 7 mm Conquest balloon to facilitate advancement of the sheath into the main portal vein. The percutaneous portal microcatheter and wire were removed. Next, a 2 cm (un covered) x 8 cm (covered) x 10 mm diameter GORE VIATORR TIPS Endoprosthesis was advanced through the sheath across the  intra hepatic track and deployed. The TIPS stent was angioplastied in multiple stations to 10 mm diameter. Follow-up portal venogram demonstrated good stent deployment, with little antegrade flow through the portal system and stent, primary outflow via the dilated branch 2 esophageal varices. Some intraluminal thrombus was evident. The branch to the esophageal varices was selectively catheterized with a 5 [place] catheter. The vein was embolized with 8 and 6 mm interlock 035 coils. The 10 mm mustang balloon was then used to macerate the thrombus in the central aspect of the splenic vein through the main portal vein and TIPS stent. Subsequent portal venography demonstrates occlusion of the vein to the esophageal varices. Some residual mural thrombus in the central splenic vein and portosplenic confluence , good flow through the TIPS stent with no stenosis or thrombus. Prolonged dilatation of the stenosis in the central splenic vein and portal splenic confluence was performed with the 10 mm balloon. Subsequent venography demonstrates continued good flow through the splenic vein and portosplenic confluence, wide patency of the TIPS stent, no  significant stenosis. Mild residual clot in the central splenic vein. Continued opacification of posterior gastric vein. Pressure measurements were obtained: Right atrium mean 16 mmHg, portal vein 20 mmHg mean, portosystemic gradient 4 mm Hg final. All wires, catheters and sheaths were removed from the patient. Hemostasis was achieved at the right IJ access site with manual compression. A dressing was placed. Dr. Markus Daft assisted with the latter portion of the procedure. The patient tolerated the procedure well. Patient transferred to the PACU. IMPRESSION: 1. Technically successful TIPS creation with decrease in mean portosystemic pressure from 15 to 4 mmHg. 2. Technically successful coil embolization of esophageal varices. 3. Ultrasound guided paracentesis removing 1.5 L  clear yellow ascites. Electronically Signed   By: Lucrezia Europe M.D.   On: 04/09/2019 08:47   [place] Guide Roadmapping  Result Date: 04/09/2019 CLINICAL DATA:  Cirrhosis, portal venous hypertension, large esophageal varices, recurrent bleeding despite aggressive endoscopic treatment. EXAM: 1. TIPS CREATION 2. ULTRASOUND-GUIDED VENOUS ACCESS 3. PARACENTESIS WITH ULTRASOUND GUIDANCE ANESTHESIA/SEDATION: General - as administered by the Anesthesia department MEDICATIONS: No periprocedural antibiotics were indicated FLUOROSCOPY TIME:  67 min , 24 sec; 546 mGy COMPLICATIONS: None immediate. PROCEDURE: Informed written consent was obtained from the patient after a thorough discussion of the procedural risks, benefits and alternatives. All questions were addressed. See previous consultation. Maximal Sterile Barrier Technique was utilized including caps, mask, sterile gowns, sterile gloves, sterile drape, hand hygiene and skin antiseptic. A timeout was performed prior to the initiation of the procedure. Patient was placed under general anesthetic by department of anesthesia personnel. The skin overlying the right upper abdominal quadrant as well as the right neck and femoral region were prepped and draped in usual sterile fashion. Initial ultrasound scanning demonstrates a moderate amount of recurrent perihepatic ascites. Under ultrasound guidance, a 6 [place] Safe-T-Centesis needle was advanced into the peritoneal cavity from a right lateral approach for paracentesis removing 1.5 L clear yellow ascites. Under direct ultrasound guidance, a peripheral aspect of the right portal vein was accessed with a 21 gauge micropuncture needle. Ultrasound image was saved for procedural documentation purposes. Guidewire advanced into the main portal vein. Micropuncture transitional dilator was advanced over the guidewire, with the radiopaque transition positioned at the target entrance to the peripheral  right portal vein. Next, the right internal jugular vein was accessed under direct ultrasound. Ultrasound image was saved for procedural documentation purposes. This allowed for placement of the 10 French TIPS vascular sheath. With the aid of angiographic guidewires, an MPA catheter was utilized to select the right hepatic vein and a hepatic venogram was performed.  Sheath advanced into the right hepatic vei for n. Under fluoroscopic guidance, the right portal vein with targeted with a Rsch-Uchida TIPS needle directed at the radiopaque target within the right portal vein. Ultimately, the right portal vein was accessed at a desirable location allowing advancement of a stiff Glidewire into the main portal vein. A 4 French glide catheter was advanced over the stiff Glidewire and a portal venogram was performed. Pressure measurements were obtained: Right atrium mean 21 mmHg, portal vein 36 mmHg; portosystemic gradient 15 mmHg preprocedure. Next, as there was difficulty advancing a measuring catheter through the intrahepatic track, the track was dilated with a 74m Mustang balloon. Measurements were taken for pre deployment planning. Subsequently a 7 mm Conquest balloon was used through the tract using a stiff Amplatz guidewire, allowing advancement of the sheath into the main portal vein. A  portal venogram was performed. Tract further dilated with the 7 mm Conquest balloon to facilitate advancement of the sheath into the main portal vein. The percutaneous portal microcatheter and wire were removed. Next, a 2 cm (un covered) x 8 cm (covered) x 10 mm diameter GORE VIATORR TIPS Endoprosthesis was advanced through the sheath across the intra hepatic track and deployed. The TIPS stent was angioplastied in multiple stations to 10 mm diameter. Follow-up portal venogram demonstrated good stent deployment, with little antegrade flow through the portal system and stent, primary outflow via the dilated branch 2 esophageal varices.  Some intraluminal thrombus was evident. The branch to the esophageal varices was selectively catheterized with a 5 [place] Kumpe catheter. The vein was embolized with 8 and 6 mm interlock 035 coils. The 10 mm mustang balloon was then used to macerate the thrombus in the central aspect of the splenic vein through the main portal vein and TIPS stent. Subsequent portal venography demonstrates occlusion of the vein to the esophageal varices. Some residual mural thrombus in the central splenic vein and portosplenic confluence , good flow through the TIPS stent with no stenosis or thrombus. Prolonged dilatation of the stenosis in the central splenic vein and portal splenic confluence was performed with the 10 mm balloon. Subsequent venography demonstrates continued good flow through the splenic vein and portosplenic confluence, wide patency of the TIPS stent, no significant stenosis. Mild residual clot in the central splenic vein. Continued opacification of posterior gastric vein. Pressure measurements were obtained: Right atrium mean 16 mmHg, portal vein 20 mmHg mean, portosystemic gradient 4 mm Hg final. All wires, catheters and sheaths were removed from the patient. Hemostasis was achieved at the right IJ access site with manual compression. A dressing was placed. Dr. Markus Daft assisted with the latter portion of the procedure. The patient tolerated the procedure well. Patient transferred to the PACU. IMPRESSION: 1. Technically successful TIPS creation with decrease in mean portosystemic pressure from 15 to 4 mmHg. 2. Technically successful coil embolization of esophageal varices. 3. Ultrasound guided paracentesis removing 1.5 L clear yellow ascites. Electronically Signed   By: Lucrezia Europe M.D.   On: 04/09/2019 08:47   Ir Paracentesis  Result Date: 04/09/2019 CLINICAL DATA:  Cirrhosis, portal venous hypertension, large esophageal varices, recurrent bleeding despite aggressive endoscopic treatment. EXAM: 1. TIPS  CREATION 2. ULTRASOUND-GUIDED VENOUS ACCESS 3. PARACENTESIS WITH ULTRASOUND GUIDANCE ANESTHESIA/SEDATION: General - as administered by the Anesthesia department MEDICATIONS: No periprocedural antibiotics were indicated FLUOROSCOPY TIME:  67 min , 24 sec; 454 mGy COMPLICATIONS: None immediate. PROCEDURE: Informed written consent was obtained from the patient after a thorough discussion of the procedural risks, benefits and alternatives. All questions were addressed. See previous consultation. Maximal Sterile Barrier Technique was utilized including caps, mask, sterile gowns, sterile gloves, sterile drape, hand hygiene and skin antiseptic. A timeout was performed prior to the initiation of the procedure. Patient was placed under general anesthetic by department of anesthesia personnel. The skin overlying the right upper abdominal quadrant as well as the right neck and femoral region were prepped and draped in usual sterile fashion. Initial ultrasound scanning demonstrates a moderate amount of recurrent perihepatic ascites. Under ultrasound guidance, a 6 [place] Safe-T-Centesis needle was advanced into the peritoneal cavity from a right lateral approach for paracentesis removing 1.5 L clear yellow ascites. Under direct ultrasound guidance, a peripheral aspect of the right portal vein was accessed with a 21 gauge micropuncture needle. Ultrasound image was saved for procedural documentation purposes. Guidewire advanced into  the main portal vein. Micropuncture transitional dilator was advanced over the guidewire, with the radiopaque transition positioned at the target entrance to the peripheral right portal vein. Next, the right internal jugular vein was accessed under direct ultrasound. Ultrasound image was saved for procedural documentation purposes. This allowed for placement of the 10 French TIPS vascular sheath. With the aid of angiographic guidewires, an MPA catheter was utilized to select the right hepatic vein and  a hepatic venogram was performed.  Sheath advanced into the right hepatic vei for n. Under fluoroscopic guidance, the right portal vein with targeted with a Rsch-Uchida TIPS needle directed at the radiopaque target within the right portal vein. Ultimately, the right portal vein was accessed at a desirable location allowing advancement of a stiff Glidewire into the main portal vein. A 4 French glide catheter was advanced over the stiff Glidewire and a portal venogram was performed. Pressure measurements were obtained: Right atrium mean 21 mmHg, portal vein 36 mmHg; portosystemic gradient 15 mmHg preprocedure. Next, as there was difficulty advancing a measuring catheter through the intrahepatic track, the track was dilated with a 76m Mustang balloon. Measurements were taken for pre deployment planning. Subsequently a 7 mm Conquest balloon was used through the tract using a stiff Amplatz guidewire, allowing advancement of the sheath into the main portal vein. A portal venogram was performed. Tract further dilated with the 7 mm Conquest balloon to facilitate advancement of the sheath into the main portal vein. The percutaneous portal microcatheter and wire were removed. Next, a 2 cm (un covered) x 8 cm (covered) x 10 mm diameter GORE VIATORR TIPS Endoprosthesis was advanced through the sheath across the intra hepatic track and deployed. The TIPS stent was angioplastied in multiple stations to 10 mm diameter. Follow-up portal venogram demonstrated good stent deployment, with little antegrade flow through the portal system and stent, primary outflow via the dilated branch 2 esophageal varices. Some intraluminal thrombus was evident. The branch to the esophageal varices was selectively catheterized with a 5 [place] catheter. The vein was embolized with 8 and 6 mm interlock 035 coils. The 10 mm mustang balloon was then used to macerate the thrombus in the central aspect of the splenic vein through the main portal vein  and TIPS stent. Subsequent portal venography demonstrates occlusion of the vein to the esophageal varices. Some residual mural thrombus in the central splenic vein and portosplenic confluence , good flow through the TIPS stent with no stenosis or thrombus. Prolonged dilatation of the stenosis in the central splenic vein and portal splenic confluence was performed with the 10 mm balloon. Subsequent venography demonstrates continued good flow through the splenic vein and portosplenic confluence, wide patency of the TIPS stent, no significant stenosis. Mild residual clot in the central splenic vein. Continued opacification of posterior gastric vein. Pressure measurements were obtained: Right atrium mean 16 mmHg, portal vein 20 mmHg mean, portosystemic gradient 4 mm Hg final. All wires, catheters and sheaths were removed from the patient. Hemostasis was achieved at the right IJ access site with manual compression. A dressing was placed. Dr. AMarkus Daftassisted with the latter portion of the procedure. The patient tolerated the procedure well. Patient transferred to the PACU. IMPRESSION: 1. Technically successful TIPS creation with decrease in mean portosystemic pressure from 15 to 4 mmHg. 2. Technically successful coil embolization of esophageal varices. 3. Ultrasound guided paracentesis removing 1.5 L clear yellow ascites. Electronically Signed   By: DLucrezia EuropeM.D.   On: 04/09/2019 08:47  Labs:  CBC: Recent Labs    04/07/19 1317 04/08/19 0608 04/08/19 1200 04/08/19 2000 04/08/19 2342 04/09/19 0316  WBC 10.0 5.2  --   --  19.9* 20.4*  HGB 12.7 11.1* 11.5* 13.7 14.3 14.4  HCT 39.8 34.1* 34.5* 42.0 46.8* 46.3*  PLT 98* 93*  --   --  138* 134*    COAGS: Recent Labs    04/25/18 1453 03/11/19 0932 04/02/19 0405 04/07/19 0350  INR 1.1* 1.1* 1.2 1.2    BMP: Recent Labs    04/07/19 0350 04/07/19 1104 04/08/19 0608 04/08/19 2342 04/09/19 0316  NA 135 138 140 142 142  K 3.7 5.4* 3.9 4.7  4.5  CL 104 105 109 109 111  CO2 22  --  24 9* <7*  GLUCOSE 181* 165* 115* 184* 242*  BUN 12 18 16 18 18   CALCIUM 8.5*  --  8.8* 8.6* 8.6*  CREATININE 0.90 0.80 1.09* 1.50* 1.40*  GFRNONAA >60  --  51* 34* 37*  GFRAA >60  --  59* 40* 43*    LIVER FUNCTION TESTS: Recent Labs    04/01/19 1319 04/02/19 0405 04/06/19 1300 04/07/19 0350  BILITOT 1.0 1.1 1.4* 1.3*  AST 55* 36 46* 36  ALT 44 33 34 29  ALKPHOS 109 80 95 71  PROT 7.9 6.0* 7.3 6.0*  ALBUMIN 3.3* 2.6* 2.9* 2.4*    Assessment and Plan:  Urgent TIPS by Dr. Vernard Gambles on 04/08/19.  Doing well. Ok to advance diet.  Ok to be out of bed, ambulate, consider PT.  IR will arrange f/u TIPS as outpatient in about a month. She will also have US imaging of her TIPS at that time to ensure patency.  Electronically Signed: Murrell Redden, PA-C 04/09/2019, 9:30 AM    I spent a total of 15 Minutes at the the patient's bedside AND on the patient's hospital floor or unit, greater than 50% of which was counseling/coordinating care for f/u TIPS.

## 2019-04-09 NOTE — Progress Notes (Signed)
Verified pt's BMP & CBC are in processing.

## 2019-04-10 DIAGNOSIS — K922 Gastrointestinal hemorrhage, unspecified: Secondary | ICD-10-CM | POA: Diagnosis not present

## 2019-04-10 DIAGNOSIS — K7460 Unspecified cirrhosis of liver: Secondary | ICD-10-CM | POA: Diagnosis not present

## 2019-04-10 DIAGNOSIS — D696 Thrombocytopenia, unspecified: Secondary | ICD-10-CM | POA: Diagnosis not present

## 2019-04-10 DIAGNOSIS — R188 Other ascites: Secondary | ICD-10-CM | POA: Diagnosis not present

## 2019-04-10 DIAGNOSIS — I8501 Esophageal varices with bleeding: Secondary | ICD-10-CM | POA: Diagnosis not present

## 2019-04-10 LAB — GLUCOSE, CAPILLARY
Glucose-Capillary: 157 mg/dL — ABNORMAL HIGH (ref 70–99)
Glucose-Capillary: 192 mg/dL — ABNORMAL HIGH (ref 70–99)
Glucose-Capillary: 285 mg/dL — ABNORMAL HIGH (ref 70–99)
Glucose-Capillary: 289 mg/dL — ABNORMAL HIGH (ref 70–99)
Glucose-Capillary: 134 mg/dL — ABNORMAL HIGH (ref 70–99)
Glucose-Capillary: 115 mg/dL — ABNORMAL HIGH (ref 70–99)
Glucose-Capillary: 105 mg/dL — ABNORMAL HIGH (ref 70–99)
Glucose-Capillary: 108 mg/dL — ABNORMAL HIGH (ref 70–99)
Glucose-Capillary: 117 mg/dL — ABNORMAL HIGH (ref 70–99)
Glucose-Capillary: 111 mg/dL — ABNORMAL HIGH (ref 70–99)
Glucose-Capillary: 134 mg/dL — ABNORMAL HIGH (ref 70–99)
Glucose-Capillary: 132 mg/dL — ABNORMAL HIGH (ref 70–99)
Glucose-Capillary: 147 mg/dL — ABNORMAL HIGH (ref 70–99)
Glucose-Capillary: 159 mg/dL — ABNORMAL HIGH (ref 70–99)
Glucose-Capillary: 210 mg/dL — ABNORMAL HIGH (ref 70–99)
Glucose-Capillary: 222 mg/dL — ABNORMAL HIGH (ref 70–99)
Glucose-Capillary: 202 mg/dL — ABNORMAL HIGH (ref 70–99)
Glucose-Capillary: 213 mg/dL — ABNORMAL HIGH (ref 70–99)
Glucose-Capillary: 212 mg/dL — ABNORMAL HIGH (ref 70–99)

## 2019-04-10 LAB — CBC
HCT: 35.4 % — ABNORMAL LOW (ref 36.0–46.0)
Hemoglobin: 12 g/dL (ref 12.0–15.0)
MCH: 31.7 pg (ref 26.0–34.0)
MCHC: 33.9 g/dL (ref 30.0–36.0)
MCV: 93.4 fL (ref 80.0–100.0)
Platelets: 64 10*3/uL — ABNORMAL LOW (ref 150–400)
RBC: 3.79 MIL/uL — ABNORMAL LOW (ref 3.87–5.11)
RDW: 14.5 % (ref 11.5–15.5)
WBC: 11.9 10*3/uL — ABNORMAL HIGH (ref 4.0–10.5)
nRBC: 0 % (ref 0.0–0.2)

## 2019-04-10 LAB — BETA-HYDROXYBUTYRIC ACID
Beta-Hydroxybutyric Acid: 0.1 mmol/L (ref 0.05–0.27)
Beta-Hydroxybutyric Acid: 0.18 mmol/L (ref 0.05–0.27)

## 2019-04-10 LAB — BASIC METABOLIC PANEL
Anion gap: 9 (ref 5–15)
Anion gap: 9 (ref 5–15)
BUN: 34 mg/dL — ABNORMAL HIGH (ref 8–23)
BUN: 34 mg/dL — ABNORMAL HIGH (ref 8–23)
CO2: 16 mmol/L — ABNORMAL LOW (ref 22–32)
CO2: 17 mmol/L — ABNORMAL LOW (ref 22–32)
Calcium: 8.1 mg/dL — ABNORMAL LOW (ref 8.9–10.3)
Calcium: 8 mg/dL — ABNORMAL LOW (ref 8.9–10.3)
Chloride: 111 mmol/L (ref 98–111)
Chloride: 110 mmol/L (ref 98–111)
Creatinine, Ser: 2.02 mg/dL — ABNORMAL HIGH (ref 0.44–1.00)
Creatinine, Ser: 2.09 mg/dL — ABNORMAL HIGH (ref 0.44–1.00)
GFR calc Af Amer: 28 mL/min — ABNORMAL LOW (ref >60)
GFR calc Af Amer: 27 mL/min — ABNORMAL LOW (ref >60)
GFR calc non Af Amer: 24 mL/min — ABNORMAL LOW (ref >60)
GFR calc non Af Amer: 23 mL/min — ABNORMAL LOW (ref >60)
Glucose, Bld: 229 mg/dL — ABNORMAL HIGH (ref 70–99)
Glucose, Bld: 135 mg/dL — ABNORMAL HIGH (ref 70–99)
Potassium: 4.1 mmol/L (ref 3.5–5.1)
Potassium: 3.9 mmol/L (ref 3.5–5.1)
Sodium: 136 mmol/L (ref 135–145)
Sodium: 136 mmol/L (ref 135–145)

## 2019-04-10 LAB — BLOOD PRODUCT ORDER (VERBAL) VERIFICATION

## 2019-04-10 MED ORDER — BISACODYL 10 MG RE SUPP
10.0000 mg | Freq: Every day | RECTAL | Status: DC | PRN
  Administered 2019-04-10: 10 mg via RECTAL
  Filled 2019-04-10: qty 1

## 2019-04-10 MED ORDER — INSULIN ASPART 100 UNIT/ML ~~LOC~~ SOLN
0.0000 [IU] | Freq: Every day | SUBCUTANEOUS | Status: DC
  Administered 2019-04-10: 2 [IU] via SUBCUTANEOUS

## 2019-04-10 MED ORDER — INSULIN DETEMIR 100 UNIT/ML ~~LOC~~ SOLN
20.0000 [IU] | Freq: Every day | SUBCUTANEOUS | Status: DC
  Administered 2019-04-10 – 2019-04-11 (×2): 20 [IU] via SUBCUTANEOUS
  Filled 2019-04-10 (×3): qty 0.2

## 2019-04-10 MED ORDER — INSULIN ASPART 100 UNIT/ML ~~LOC~~ SOLN
0.0000 [IU] | Freq: Three times a day (TID) | SUBCUTANEOUS | Status: DC
  Administered 2019-04-10: 16:00:00 3 [IU] via SUBCUTANEOUS
  Administered 2019-04-11 – 2019-04-12 (×4): 2 [IU] via SUBCUTANEOUS

## 2019-04-10 MED ORDER — ALBUMIN HUMAN 25 % IV SOLN
25.0000 g | Freq: Once | INTRAVENOUS | Status: AC
  Administered 2019-04-10: 25 g via INTRAVENOUS
  Filled 2019-04-10: qty 50

## 2019-04-10 MED ORDER — ALBUMIN HUMAN 25 % IV SOLN
25.0000 g | Freq: Once | INTRAVENOUS | Status: AC
  Administered 2019-04-10: 21:00:00 25 g via INTRAVENOUS
  Filled 2019-04-10: qty 100

## 2019-04-10 NOTE — Consult Note (Signed)
   [facility] Inpatient Consult   04/10/2019  BECCI BATTY February 12, 1946 [id number]   [facility] Active Status: Pending  Referral from [facility] follow up referral received 04/08/2019  Patient screened for high risk score for unplanned readmission score for less than 7 days readmission hospitalization.  Review of patient's medical record reveals patient was admitted to ICU.  Primary Care Provider is Hollace Kinnier, MD, [facility], this office is listed to  provide the transition of care    Will follow up with inpatient [facility] team when it is appropriate and disposition needs are better known.  Plan: Following for referral needs. Continue to follow progress and disposition to assess for post hospital care management needs.   For questions contact:   Natividad Brood, RN BSN [facility] Liaison  [phone] business mobile phone Toll free office [phone]  Fax number: [phone] [email or web address]

## 2019-04-10 NOTE — Progress Notes (Signed)
Progress Note   Subjective  Patient doing well, denies bleeding symptoms, hungry. Renal function slightly worse, otherwise doing better.   Objective   Vital signs in last 24 hours: Temp:  [97.7 F (36.5 C)-98.4 F (36.9 C)] 97.7 F (36.5 C) (12/02 1200) Pulse Rate:  [61-76] 61 (12/02 1100) Resp:  [17-24] 24 (12/02 1227) BP: (77-93)/(48-65) 90/57 (12/02 1227) SpO2:  [97 %-100 %] 97 % (12/02 1100) Arterial Line BP: (113-151)/(52-97) 132/58 (12/02 1227) Weight:  [68.9 kg] 68.9 kg (12/02 0500) Last BM Date: 04/09/19 General:    white female in NAD Heart:  Regular rate and rhythm; no murmurs Lungs: Respirations even and unlabored,  Abdomen:  Soft, nontender and nondistended.  Extremities:  Trace LE edema. Neurologic:  Alert and oriented,  grossly normal neurologically. Psych:  Cooperative. Normal mood and affect.  Intake/Output from previous day: 12/01 0701 - 12/02 0700 In: 3621.7 [P.O.:480; I.V.:2841.7; IV Piggyback:300.1] Out: 895 [Urine:895] Intake/Output this shift: Total I/O In: 1035.7 [I.V.:935.6; IV Piggyback:100.1] Out: 350 [Urine:350]  Lab Results: Recent Labs    04/08/19 2342 04/09/19 0316 04/09/19 1531 04/10/19 0835  WBC 19.9* 20.4*  --  11.9*  HGB 14.3 14.4 12.6 12.0  HCT 46.8* 46.3* 37.0 35.4*  PLT 138* 134*  --  64*   BMET Recent Labs    04/09/19 1530 04/09/19 1531 04/10/19 0028 04/10/19 0641  NA 137 138 136 136  K 4.7 4.7 4.1 3.9  CL 110  --  111 110  CO2 13*  --  16* 17*  GLUCOSE 309*  --  229* 135*  BUN 27*  --  34* 34*  CREATININE 1.75*  --  2.02* 2.09*  CALCIUM 8.3*  --  8.1* 8.0*   LFT No results for input(s): PROT, ALBUMIN, AST, ALT, ALKPHOS, BILITOT, BILIDIR, IBILI in the last 72 hours. PT/INR No results for input(s): LABPROT, INR in the last 72 hours.  Studies/Results: Ir Angiogram Selective Each Additional Vessel  Result Date: 04/09/2019 CLINICAL DATA:  Cirrhosis, portal venous hypertension, large esophageal varices,  recurrent bleeding despite aggressive endoscopic treatment. EXAM: 1. TIPS CREATION 2. ULTRASOUND-GUIDED VENOUS ACCESS 3. PARACENTESIS WITH ULTRASOUND GUIDANCE ANESTHESIA/SEDATION: General - as administered by the Anesthesia department MEDICATIONS: No periprocedural antibiotics were indicated FLUOROSCOPY TIME:  67 min , 24 sec; 277 mGy COMPLICATIONS: None immediate. PROCEDURE: Informed written consent was obtained from the patient after a thorough discussion of the procedural risks, benefits and alternatives. All questions were addressed. See previous consultation. Maximal Sterile Barrier Technique was utilized including caps, mask, sterile gowns, sterile gloves, sterile drape, hand hygiene and skin antiseptic. A timeout was performed prior to the initiation of the procedure. Patient was placed under general anesthetic by department of anesthesia personnel. The skin overlying the right upper abdominal quadrant as well as the right neck and femoral region were prepped and draped in usual sterile fashion. Initial ultrasound scanning demonstrates a moderate amount of recurrent perihepatic ascites. Under ultrasound guidance, a 6 [place] Safe-T-Centesis needle was advanced into the peritoneal cavity from a right lateral approach for paracentesis removing 1.5 L clear yellow ascites. Under direct ultrasound guidance, a peripheral aspect of the right portal vein was accessed with a 21 gauge micropuncture needle. Ultrasound image was saved for procedural documentation purposes. Guidewire advanced into the main portal vein. Micropuncture transitional dilator was advanced over the guidewire, with the radiopaque transition positioned at the target entrance to the peripheral right portal vein. Next, the right internal jugular vein was accessed under direct  ultrasound. Ultrasound image was saved for procedural documentation purposes. This allowed for placement of the 10 French TIPS vascular sheath. With the aid of angiographic  guidewires, an MPA catheter was utilized to select the right hepatic vein and a hepatic venogram was performed.  Sheath advanced into the right hepatic vei for n. Under fluoroscopic guidance, the right portal vein with targeted with a Rsch-Uchida TIPS needle directed at the radiopaque target within the right portal vein. Ultimately, the right portal vein was accessed at a desirable location allowing advancement of a stiff Glidewire into the main portal vein. A 4 French glide catheter was advanced over the stiff Glidewire and a portal venogram was performed. Pressure measurements were obtained: Right atrium mean 21 mmHg, portal vein 36 mmHg; portosystemic gradient 15 mmHg preprocedure. Next, as there was difficulty advancing a measuring catheter through the intrahepatic track, the track was dilated with a 34m Mustang balloon. Measurements were taken for pre deployment planning. Subsequently a 7 mm Conquest balloon was used through the tract using a stiff Amplatz guidewire, allowing advancement of the sheath into the main portal vein. A portal venogram was performed. Tract further dilated with the 7 mm Conquest balloon to facilitate advancement of the sheath into the main portal vein. The percutaneous portal microcatheter and wire were removed. Next, a 2 cm (un covered) x 8 cm (covered) x 10 mm diameter GORE VIATORR TIPS Endoprosthesis was advanced through the sheath across the intra hepatic track and deployed. The TIPS stent was angioplastied in multiple stations to 10 mm diameter. Follow-up portal venogram demonstrated good stent deployment, with little antegrade flow through the portal system and stent, primary outflow via the dilated branch 2 esophageal varices. Some intraluminal thrombus was evident. The branch to the esophageal varices was selectively catheterized with a 5 [place] catheter. The vein was embolized with 8 and 6 mm interlock 035 coils. The 10 mm mustang balloon was then used to macerate the  thrombus in the central aspect of the splenic vein through the main portal vein and TIPS stent. Subsequent portal venography demonstrates occlusion of the vein to the esophageal varices. Some residual mural thrombus in the central splenic vein and portosplenic confluence , good flow through the TIPS stent with no stenosis or thrombus. Prolonged dilatation of the stenosis in the central splenic vein and portal splenic confluence was performed with the 10 mm balloon. Subsequent venography demonstrates continued good flow through the splenic vein and portosplenic confluence, wide patency of the TIPS stent, no significant stenosis. Mild residual clot in the central splenic vein. Continued opacification of posterior gastric vein. Pressure measurements were obtained: Right atrium mean 16 mmHg, portal vein 20 mmHg mean, portosystemic gradient 4 mm Hg final. All wires, catheters and sheaths were removed from the patient. Hemostasis was achieved at the right IJ access site with manual compression. A dressing was placed. Dr. AMarkus Daftassisted with the latter portion of the procedure. The patient tolerated the procedure well. Patient transferred to the PACU. IMPRESSION: 1. Technically successful TIPS creation with decrease in mean portosystemic pressure from 15 to 4 mmHg. 2. Technically successful coil embolization of esophageal varices. 3. Ultrasound guided paracentesis removing 1.5 L clear yellow ascites. Electronically Signed   By: DLucrezia EuropeM.D.   On: 04/09/2019 08:47   Ir Tips  Result Date: 04/09/2019 CLINICAL DATA:  Cirrhosis, portal venous hypertension, large esophageal varices, recurrent bleeding despite aggressive endoscopic treatment. EXAM: 1. TIPS CREATION 2. ULTRASOUND-GUIDED VENOUS ACCESS 3. PARACENTESIS WITH ULTRASOUND GUIDANCE ANESTHESIA/SEDATION:  General - as administered by the Anesthesia department MEDICATIONS: No periprocedural antibiotics were indicated FLUOROSCOPY TIME:  67 min , 24 sec; 616 mGy  COMPLICATIONS: None immediate. PROCEDURE: Informed written consent was obtained from the patient after a thorough discussion of the procedural risks, benefits and alternatives. All questions were addressed. See previous consultation. Maximal Sterile Barrier Technique was utilized including caps, mask, sterile gowns, sterile gloves, sterile drape, hand hygiene and skin antiseptic. A timeout was performed prior to the initiation of the procedure. Patient was placed under general anesthetic by department of anesthesia personnel. The skin overlying the right upper abdominal quadrant as well as the right neck and femoral region were prepped and draped in usual sterile fashion. Initial ultrasound scanning demonstrates a moderate amount of recurrent perihepatic ascites. Under ultrasound guidance, a 6 [place] Safe-T-Centesis needle was advanced into the peritoneal cavity from a right lateral approach for paracentesis removing 1.5 L clear yellow ascites. Under direct ultrasound guidance, a peripheral aspect of the right portal vein was accessed with a 21 gauge micropuncture needle. Ultrasound image was saved for procedural documentation purposes. Guidewire advanced into the main portal vein. Micropuncture transitional dilator was advanced over the guidewire, with the radiopaque transition positioned at the target entrance to the peripheral right portal vein. Next, the right internal jugular vein was accessed under direct ultrasound. Ultrasound image was saved for procedural documentation purposes. This allowed for placement of the 10 French TIPS vascular sheath. With the aid of angiographic guidewires, an MPA catheter was utilized to select the right hepatic vein and a hepatic venogram was performed.  Sheath advanced into the right hepatic vei for n. Under fluoroscopic guidance, the right portal vein with targeted with a Rsch-Uchida TIPS needle directed at the radiopaque target within the right portal vein. Ultimately, the  right portal vein was accessed at a desirable location allowing advancement of a stiff Glidewire into the main portal vein. A 4 French glide catheter was advanced over the stiff Glidewire and a portal venogram was performed. Pressure measurements were obtained: Right atrium mean 21 mmHg, portal vein 36 mmHg; portosystemic gradient 15 mmHg preprocedure. Next, as there was difficulty advancing a measuring catheter through the intrahepatic track, the track was dilated with a 38m Mustang balloon. Measurements were taken for pre deployment planning. Subsequently a 7 mm Conquest balloon was used through the tract using a stiff Amplatz guidewire, allowing advancement of the sheath into the main portal vein. A portal venogram was performed. Tract further dilated with the 7 mm Conquest balloon to facilitate advancement of the sheath into the main portal vein. The percutaneous portal microcatheter and wire were removed. Next, a 2 cm (un covered) x 8 cm (covered) x 10 mm diameter GORE VIATORR TIPS Endoprosthesis was advanced through the sheath across the intra hepatic track and deployed. The TIPS stent was angioplastied in multiple stations to 10 mm diameter. Follow-up portal venogram demonstrated good stent deployment, with little antegrade flow through the portal system and stent, primary outflow via the dilated branch 2 esophageal varices. Some intraluminal thrombus was evident. The branch to the esophageal varices was selectively catheterized with a 5 [place] catheter. The vein was embolized with 8 and 6 mm interlock 035 coils. The 10 mm mustang balloon was then used to macerate the thrombus in the central aspect of the splenic vein through the main portal vein and TIPS stent. Subsequent portal venography demonstrates occlusion of the vein to the esophageal varices. Some residual mural thrombus in the central splenic vein  and portosplenic confluence , good flow through the TIPS stent with no stenosis or thrombus.  Prolonged dilatation of the stenosis in the central splenic vein and portal splenic confluence was performed with the 10 mm balloon. Subsequent venography demonstrates continued good flow through the splenic vein and portosplenic confluence, wide patency of the TIPS stent, no significant stenosis. Mild residual clot in the central splenic vein. Continued opacification of posterior gastric vein. Pressure measurements were obtained: Right atrium mean 16 mmHg, portal vein 20 mmHg mean, portosystemic gradient 4 mm Hg final. All wires, catheters and sheaths were removed from the patient. Hemostasis was achieved at the right IJ access site with manual compression. A dressing was placed. Dr. Markus Daft assisted with the latter portion of the procedure. The patient tolerated the procedure well. Patient transferred to the PACU. IMPRESSION: 1. Technically successful TIPS creation with decrease in mean portosystemic pressure from 15 to 4 mmHg. 2. Technically successful coil embolization of esophageal varices. 3. Ultrasound guided paracentesis removing 1.5 L clear yellow ascites. Electronically Signed   By: Lucrezia Europe M.D.   On: 04/09/2019 08:47   [place] Guide Roadmapping  Result Date: 04/09/2019 CLINICAL DATA:  Cirrhosis, portal venous hypertension, large esophageal varices, recurrent bleeding despite aggressive endoscopic treatment. EXAM: 1. TIPS CREATION 2. ULTRASOUND-GUIDED VENOUS ACCESS 3. PARACENTESIS WITH ULTRASOUND GUIDANCE ANESTHESIA/SEDATION: General - as administered by the Anesthesia department MEDICATIONS: No periprocedural antibiotics were indicated FLUOROSCOPY TIME:  67 min , 24 sec; 497 mGy COMPLICATIONS: None immediate. PROCEDURE: Informed written consent was obtained from the patient after a thorough discussion of the procedural risks, benefits and alternatives. All questions were addressed. See previous consultation. Maximal Sterile Barrier Technique was utilized  including caps, mask, sterile gowns, sterile gloves, sterile drape, hand hygiene and skin antiseptic. A timeout was performed prior to the initiation of the procedure. Patient was placed under general anesthetic by department of anesthesia personnel. The skin overlying the right upper abdominal quadrant as well as the right neck and femoral region were prepped and draped in usual sterile fashion. Initial ultrasound scanning demonstrates a moderate amount of recurrent perihepatic ascites. Under ultrasound guidance, a 6 [place] Safe-T-Centesis needle was advanced into the peritoneal cavity from a right lateral approach for paracentesis removing 1.5 L clear yellow ascites. Under direct ultrasound guidance, a peripheral aspect of the right portal vein was accessed with a 21 gauge micropuncture needle. Ultrasound image was saved for procedural documentation purposes. Guidewire advanced into the main portal vein. Micropuncture transitional dilator was advanced over the guidewire, with the radiopaque transition positioned at the target entrance to the peripheral right portal vein. Next, the right internal jugular vein was accessed under direct ultrasound. Ultrasound image was saved for procedural documentation purposes. This allowed for placement of the 10 French TIPS vascular sheath. With the aid of angiographic guidewires, an MPA catheter was utilized to select the right hepatic vein and a hepatic venogram was performed.  Sheath advanced into the right hepatic vei for n. Under fluoroscopic guidance, the right portal vein with targeted with a Rsch-Uchida TIPS needle directed at the radiopaque target within the right portal vein. Ultimately, the right portal vein was accessed at a desirable location allowing advancement of a stiff Glidewire into the main portal vein. A 4 French glide catheter was advanced over the stiff Glidewire and a portal venogram was performed. Pressure measurements were obtained: Right atrium mean 21  mmHg, portal vein 36 mmHg; portosystemic gradient 15 mmHg  preprocedure. Next, as there was difficulty advancing a measuring catheter through the intrahepatic track, the track was dilated with a 66m Mustang balloon. Measurements were taken for pre deployment planning. Subsequently a 7 mm Conquest balloon was used through the tract using a stiff Amplatz guidewire, allowing advancement of the sheath into the main portal vein. A portal venogram was performed. Tract further dilated with the 7 mm Conquest balloon to facilitate advancement of the sheath into the main portal vein. The percutaneous portal microcatheter and wire were removed. Next, a 2 cm (un covered) x 8 cm (covered) x 10 mm diameter GORE VIATORR TIPS Endoprosthesis was advanced through the sheath across the intra hepatic track and deployed. The TIPS stent was angioplastied in multiple stations to 10 mm diameter. Follow-up portal venogram demonstrated good stent deployment, with little antegrade flow through the portal system and stent, primary outflow via the dilated branch 2 esophageal varices. Some intraluminal thrombus was evident. The branch to the esophageal varices was selectively catheterized with a 5 [place] catheter. The vein was embolized with 8 and 6 mm interlock 035 coils. The 10 mm mustang balloon was then used to macerate the thrombus in the central aspect of the splenic vein through the main portal vein and TIPS stent. Subsequent portal venography demonstrates occlusion of the vein to the esophageal varices. Some residual mural thrombus in the central splenic vein and portosplenic confluence , good flow through the TIPS stent with no stenosis or thrombus. Prolonged dilatation of the stenosis in the central splenic vein and portal splenic confluence was performed with the 10 mm balloon. Subsequent venography demonstrates continued good flow through the splenic vein and portosplenic confluence, wide patency of the TIPS stent, no  significant stenosis. Mild residual clot in the central splenic vein. Continued opacification of posterior gastric vein. Pressure measurements were obtained: Right atrium mean 16 mmHg, portal vein 20 mmHg mean, portosystemic gradient 4 mm Hg final. All wires, catheters and sheaths were removed from the patient. Hemostasis was achieved at the right IJ access site with manual compression. A dressing was placed. Dr. AMarkus Daftassisted with the latter portion of the procedure. The patient tolerated the procedure well. Patient transferred to the PACU. IMPRESSION: 1. Technically successful TIPS creation with decrease in mean portosystemic pressure from 15 to 4 mmHg. 2. Technically successful coil embolization of esophageal varices. 3. Ultrasound guided paracentesis removing 1.5 L clear yellow ascites. Electronically Signed   By: DLucrezia EuropeM.D.   On: 04/09/2019 08:47   Ir Paracentesis  Result Date: 04/09/2019 CLINICAL DATA:  Cirrhosis, portal venous hypertension, large esophageal varices, recurrent bleeding despite aggressive endoscopic treatment. EXAM: 1. TIPS CREATION 2. ULTRASOUND-GUIDED VENOUS ACCESS 3. PARACENTESIS WITH ULTRASOUND GUIDANCE ANESTHESIA/SEDATION: General - as administered by the Anesthesia department MEDICATIONS: No periprocedural antibiotics were indicated FLUOROSCOPY TIME:  67 min , 24 sec; 4563mGy COMPLICATIONS: None immediate. PROCEDURE: Informed written consent was obtained from the patient after a thorough discussion of the procedural risks, benefits and alternatives. All questions were addressed. See previous consultation. Maximal Sterile Barrier Technique was utilized including caps, mask, sterile gowns, sterile gloves, sterile drape, hand hygiene and skin antiseptic. A timeout was performed prior to the initiation of the procedure. Patient was placed under general anesthetic by department of anesthesia personnel. The skin overlying the right upper abdominal quadrant as well as the right  neck and femoral region were prepped and draped in usual sterile fashion. Initial ultrasound scanning demonstrates a moderate amount of recurrent perihepatic ascites. Under  ultrasound guidance, a 6 [place] Safe-T-Centesis needle was advanced into the peritoneal cavity from a right lateral approach for paracentesis removing 1.5 L clear yellow ascites. Under direct ultrasound guidance, a peripheral aspect of the right portal vein was accessed with a 21 gauge micropuncture needle. Ultrasound image was saved for procedural documentation purposes. Guidewire advanced into the main portal vein. Micropuncture transitional dilator was advanced over the guidewire, with the radiopaque transition positioned at the target entrance to the peripheral right portal vein. Next, the right internal jugular vein was accessed under direct ultrasound. Ultrasound image was saved for procedural documentation purposes. This allowed for placement of the 10 French TIPS vascular sheath. With the aid of angiographic guidewires, an MPA catheter was utilized to select the right hepatic vein and a hepatic venogram was performed.  Sheath advanced into the right hepatic vei for n. Under fluoroscopic guidance, the right portal vein with targeted with a Rsch-Uchida TIPS needle directed at the radiopaque target within the right portal vein. Ultimately, the right portal vein was accessed at a desirable location allowing advancement of a stiff Glidewire into the main portal vein. A 4 French glide catheter was advanced over the stiff Glidewire and a portal venogram was performed. Pressure measurements were obtained: Right atrium mean 21 mmHg, portal vein 36 mmHg; portosystemic gradient 15 mmHg preprocedure. Next, as there was difficulty advancing a measuring catheter through the intrahepatic track, the track was dilated with a 31m Mustang balloon. Measurements were taken for pre deployment planning. Subsequently a 7 mm Conquest balloon was used through the  tract using a stiff Amplatz guidewire, allowing advancement of the sheath into the main portal vein. A portal venogram was performed. Tract further dilated with the 7 mm Conquest balloon to facilitate advancement of the sheath into the main portal vein. The percutaneous portal microcatheter and wire were removed. Next, a 2 cm (un covered) x 8 cm (covered) x 10 mm diameter GORE VIATORR TIPS Endoprosthesis was advanced through the sheath across the intra hepatic track and deployed. The TIPS stent was angioplastied in multiple stations to 10 mm diameter. Follow-up portal venogram demonstrated good stent deployment, with little antegrade flow through the portal system and stent, primary outflow via the dilated branch 2 esophageal varices. Some intraluminal thrombus was evident. The branch to the esophageal varices was selectively catheterized with a 5 [place] catheter. The vein was embolized with 8 and 6 mm interlock 035 coils. The 10 mm mustang balloon was then used to macerate the thrombus in the central aspect of the splenic vein through the main portal vein and TIPS stent. Subsequent portal venography demonstrates occlusion of the vein to the esophageal varices. Some residual mural thrombus in the central splenic vein and portosplenic confluence , good flow through the TIPS stent with no stenosis or thrombus. Prolonged dilatation of the stenosis in the central splenic vein and portal splenic confluence was performed with the 10 mm balloon. Subsequent venography demonstrates continued good flow through the splenic vein and portosplenic confluence, wide patency of the TIPS stent, no significant stenosis. Mild residual clot in the central splenic vein. Continued opacification of posterior gastric vein. Pressure measurements were obtained: Right atrium mean 16 mmHg, portal vein 20 mmHg mean, portosystemic gradient 4 mm Hg final. All wires, catheters and sheaths were removed from the patient. Hemostasis was achieved  at the right IJ access site with manual compression. A dressing was placed. Dr. AMarkus Daftassisted with the latter portion of the procedure. The patient tolerated the  procedure well. Patient transferred to the PACU. IMPRESSION: 1. Technically successful TIPS creation with decrease in mean portosystemic pressure from 15 to 4 mmHg. 2. Technically successful coil embolization of esophageal varices. 3. Ultrasound guided paracentesis removing 1.5 L clear yellow ascites. Electronically Signed   By: Lucrezia Europe M.D.   On: 04/09/2019 08:47       Assessment / Plan:   73 y/o female with decompensated cirrhosis, history of large esophageal varices despite nadolol banded x 14 in recent weeks, history of ascites, came in with odynophagia and had an EGD with Dr. Collene Mares a few days ago that was complicated by severe bleeding that was difficult to control. Post EGD she had persistent blood per rectum and IR took patient for TIPS 2 days ago.   She has not had any further blood per rectum, Hgb has drifted down but no overt blood loss. Her renal function has worsened slightly, would give trial of albumin today x 2 doses (would prefer to use this versus IVF) and continue to hold diuretics. She does not have any obvious hepatic encephalopathy but would have low threshold to start lactulose with any mental status changes post TIPS.  Would continue IV octreotide drip through today and then stop. Continue IV protonix and ceftriaxone for SBP prophylaxis. Can progress to full liquid diet and advance to soft diet tomorrow if tolerated.   Call with questions, will continue to follow.  Marble Cellar, MD [facility]

## 2019-04-10 NOTE — Progress Notes (Signed)
Patient ID: [name], female   DOB: 01-02-1946, 73 y.o.   MRN: [id number]  PROGRESS NOTE    [name]  WNU:[id number] DOB: [birth year] DOA: 04/06/2019 PCP: Gayland Curry, DO   Brief Narrative:  73 year old female with history of NASH cirrhosis, varices status post EGD with banding on 03/28/2019, CAD, diabetes mellitus type 2 complained of not being able to eat or drink after EGD on  which she was seen by GI and underwent endoscopy which showed multiple oozing varices that were injected with epinephrine.  She was admitted to ICU for further monitoring.  She eventually underwent TIPS procedure by IR as well as coil embolization of her varices.  Assessment & Plan:   Upper GI bleeding from esophageal varices -Underwent EGD and banding of esophageal varices.  Patient continued to have GI bleeding. -Underwent TIPS procedure by IR with coil embolization of her varices -GI and IR following.  Currently still on Protonix and octreotide infusions -Also on prophylactic Rocephin -Hemoglobin stable at 12 today  Acute metabolic acidosis -Improving.  Currently on insulin drip.  We will switch to long-acting insulin and DC insulin drip. -We will switch to IV fluids as below  Acute kidney injury -Creatinine 2.09 today.  We will switch IV fluids to normal saline at 100 cc an hour -Repeat a.m. labs.  Thrombocytopenia  -Probably from liver disease.  Monitor.   Diabetes mellitus type 2 uncontrolled with hyperglycemia -Because of acidosis and hypoglycemia, she was switched to insulin drip.  Will switch back to long-acting insulin and DC insulin drip.  NASH cirrhosis -Lasix and Aldactone on hold.  GI following.  Continue Rocephin for SBP prophylaxis -Required ultrasound-guided paracentesis with removal of 1.5 L of fluid  Portal vein thrombosis -Not a candidate for anticoagulation.  Per GI, chronic finding, first seen in 2018  Coronary artery disease -No chest pain at this  time.  Generalized deconditioning -We will need PT evaluation once she is a little stable    DVT prophylaxis: SCDs Code Status: Full Family Communication: Spoke to patient at bedside Disposition Plan: Depends on clinical outcome  Consultants: GI/IR  Procedures:   TIPS  Coil embolization of esophageal varices  Ultrasound-guided paracentesis with removal of 1.5 L of fluid  EGD:- Multiple esophageal ulcers-one was actively  bleeding in the distal; injected with epinephrine  and 2 clips (MR unsafe) were placed. - Esophageal varix at 35 cm-ulcer with stigmata of  recent bleeding-banded x 1.. - Portal hypertensive gastropathy. - Normal duodenal bulb. - No specimens collected.  Antimicrobials:  Rocephin from 04/09/2019 onwards  Subjective: Patient seen and examined at bedside.  She does not feel well and feels weak.  No overnight fever or vomiting.  Denies worsening shortness of breath.  Objective: Vitals:   04/10/19 0800 04/10/19 0900 04/10/19 1000 04/10/19 1100  BP: (!) 86/55 (!) 82/52 (!) 88/55 (!) 83/53  Pulse: 63 62 62 61  Resp: 20 18 (!) 21 20  Temp:      TempSrc:      SpO2: 98% 98% 98% 97%  Weight:      Height:        Intake/Output Summary (Last 24 hours) at 04/10/2019 1158 Last data filed at 04/10/2019 1100 Gross per 24 hour  Intake 3535.67 ml  Output 795 ml  Net 2740.67 ml   Filed Weights   04/08/19 0500 04/09/19 0340 04/10/19 0500  Weight: 64.4 kg 68.9 kg 68.9 kg    Examination:  General exam: Appears older than stated  age.  Looks chronically ill.  No distress. Respiratory system: Bilateral decreased breath sounds at bases with scattered crackles Cardiovascular system: S1 & S2 heard, Rate controlled Gastrointestinal system: Abdomen is nondistended, soft and nontender.  Normal bowel sounds heard. Extremities: No cyanosis, clubbing; trace lower extremity edema Central nervous system: Alert and oriented. No focal neurological deficits. Moving extremities Skin: No rashes, lesions or ulcers Psychiatry: Flat affect    Data Reviewed: I have personally reviewed following labs and imaging studies  CBC: Recent Labs  Lab 04/07/19 1317 04/08/19 0608  04/08/19 2000 04/08/19 2342 04/09/19 0316 04/09/19 1531 04/10/19 0835  WBC 10.0 5.2  --   --  19.9* 20.4*  --  11.9*  HGB 12.7 11.1*   < > 13.7 14.3 14.4 12.6 12.0  HCT 39.8 34.1*   < > 42.0 46.8* 46.3* 37.0 35.4*  MCV 99.0 97.2  --   --  102.4* 102.0*  --  93.4  PLT 98* 93*  --   --  138* 134*  --  64*   < > = values in this interval not displayed.   Basic Metabolic Panel: Recent Labs  Lab 04/08/19 2342 04/09/19 0316 04/09/19 1530 04/09/19 1531 04/10/19 0028 04/10/19 0641  NA 142 142 137 138 136 136  K 4.7 4.5 4.7 4.7 4.1 3.9  CL 109 111 110  --  111 110  CO2 9* <7* 13*  --  16* 17*  GLUCOSE 184* 242* 309*  --  229* 135*  BUN 18 18 27*  --  34* 34*  CREATININE 1.50* 1.40* 1.75*  --  2.02* 2.09*  CALCIUM 8.6* 8.6* 8.3*  --  8.1* 8.0*   GFR: Estimated Creatinine Clearance: 22.7 mL/min (A) (by C-G formula based on SCr of 2.09 mg/dL (H)). Liver Function Tests: Recent Labs  Lab 04/06/19 1300 04/07/19 0350  AST 46* 36  ALT 34 29  ALKPHOS 95 71  BILITOT 1.4* 1.3*  PROT 7.3 6.0*  ALBUMIN 2.9* 2.4*   Recent Labs  Lab 04/06/19 1300  LIPASE 23   No results for input(s): AMMONIA in the last 168 hours. Coagulation Profile: Recent Labs  Lab 04/07/19 0350  INR 1.2   Cardiac Enzymes: No results for input(s): CKTOTAL, CKMB, CKMBINDEX, TROPONINI in the last 168 hours. BNP (last 3 results) No results for input(s): PROBNP in the last 8760 hours. HbA1C: No results for input(s): HGBA1C in the last 72 hours. CBG: Recent Labs  Lab 04/10/19 0630 04/10/19 0732 04/10/19 0846 04/10/19 1000  04/10/19 1109  GLUCAP 117* 111* 134* 132* 147*   Lipid Profile: No results for input(s): CHOL, HDL, LDLCALC, TRIG, CHOLHDL, LDLDIRECT in the last 72 hours. Thyroid Function Tests: No results for input(s): TSH, T4TOTAL, FREET4, T3FREE, THYROIDAB in the last 72 hours. Anemia Panel: No results for input(s): VITAMINB12, FOLATE, FERRITIN, TIBC, IRON, RETICCTPCT in the last 72 hours. Sepsis Labs: Recent Labs  Lab 04/09/19 1530 04/09/19 1818  LATICACIDVEN 4.0* 2.9*    Recent Results (from the past 240 hour(s))  SARS CORONAVIRUS 2 (TAT 6-24 HRS) Nasopharyngeal Nasopharyngeal Swab     Status: None   Collection Time: 04/01/19  3:29 PM   Specimen: Nasopharyngeal Swab  Result Value Ref Range Status   SARS Coronavirus 2 NEGATIVE NEGATIVE Final    Comment: (NOTE) SARS-CoV-2 target nucleic acids are NOT DETECTED. The SARS-CoV-2 RNA is generally detectable in upper and lower respiratory specimens during the acute phase of infection. Negative results do not preclude SARS-CoV-2 infection, do not rule out  co-infections with other pathogens, and should not be used as the sole basis for treatment or other patient management decisions. Negative results must be combined with clinical observations, patient history, and epidemiological information. The expected result is Negative. Fact Sheet for Patients: SugarRoll.be Fact Sheet for Healthcare Providers: https://www.woods-mathews.com/ This test is not yet approved or cleared by the [place] FDA and  has been authorized for detection and/or diagnosis of SARS-CoV-2 by FDA under an Emergency Use Authorization (EUA). This EUA will remain  in effect (meaning this test can be used) for the duration of the COVID-19 declaration under Section 56 4(b)(1) of the Act, 21 U.S.C. section 360bbb-3(b)(1), unless the authorization is terminated or revoked sooner. Performed at [facility], [place] [address],  [place], [place] [address]   SARS CORONAVIRUS 2 (TAT 6-24 HRS) Nasopharyngeal Nasopharyngeal Swab     Status: None   Collection Time: 04/06/19  8:09 PM   Specimen: Nasopharyngeal Swab  Result Value Ref Range Status   SARS Coronavirus 2 NEGATIVE NEGATIVE Final    Comment: (NOTE) SARS-CoV-2 target nucleic acids are NOT DETECTED. The SARS-CoV-2 RNA is generally detectable in upper and lower respiratory specimens during the acute phase of infection. Negative results do not preclude SARS-CoV-2 infection, do not rule out co-infections with other pathogens, and should not be used as the sole basis for treatment or other patient management decisions. Negative results must be combined with clinical observations, patient history, and epidemiological information. The expected result is Negative. Fact Sheet for Patients: SugarRoll.be Fact Sheet for Healthcare Providers: https://www.woods-mathews.com/ This test is not yet approved or cleared by the [place] FDA and  has been authorized for detection and/or diagnosis of SARS-CoV-2 by FDA under an Emergency Use Authorization (EUA). This EUA will remain  in effect (meaning this test can be used) for the duration of the COVID-19 declaration under Section 56 4(b)(1) of the Act, 21 U.S.C. section 360bbb-3(b)(1), unless the authorization is terminated or revoked sooner. Performed at [facility], [place] [address], [place], [place] [address]   MRSA PCR Screening     Status: None   Collection Time: 04/07/19  1:42 PM   Specimen: Nasal Mucosa; Nasopharyngeal  Result Value Ref Range Status   MRSA by PCR NEGATIVE NEGATIVE Final    Comment:        The GeneXpert MRSA Assay (FDA approved for NASAL specimens only), is one component of a comprehensive MRSA colonization surveillance program. It is not intended to diagnose MRSA infection nor to guide or monitor treatment for MRSA infections. Performed at  [facility], [place] [address], [place], [place] [address]          Radiology Studies: Ir Angiogram Selective Each Additional Vessel  Result Date: 04/09/2019 CLINICAL DATA:  Cirrhosis, portal venous hypertension, large esophageal varices, recurrent bleeding despite aggressive endoscopic treatment. EXAM: 1. TIPS CREATION 2. ULTRASOUND-GUIDED VENOUS ACCESS 3. PARACENTESIS WITH ULTRASOUND GUIDANCE ANESTHESIA/SEDATION: General - as administered by the Anesthesia department MEDICATIONS: No periprocedural antibiotics were indicated FLUOROSCOPY TIME:  67 min , 24 sec; 956 mGy COMPLICATIONS: None immediate. PROCEDURE: Informed written consent was obtained from the patient after a thorough discussion of the procedural risks, benefits and alternatives. All questions were addressed. See previous consultation. Maximal Sterile Barrier Technique was utilized including caps, mask, sterile gowns, sterile gloves, sterile drape, hand hygiene and skin antiseptic. A timeout was performed prior to the initiation of the procedure. Patient was placed under general anesthetic by department of anesthesia personnel.  The skin overlying the right upper abdominal quadrant as well as the right neck and femoral region were prepped and draped in usual sterile fashion. Initial ultrasound scanning demonstrates a moderate amount of recurrent perihepatic ascites. Under ultrasound guidance, a 6 [place] Safe-T-Centesis needle was advanced into the peritoneal cavity from a right lateral approach for paracentesis removing 1.5 L clear yellow ascites. Under direct ultrasound guidance, a peripheral aspect of the right portal vein was accessed with a 21 gauge micropuncture needle. Ultrasound image was saved for procedural documentation purposes. Guidewire advanced into the main portal vein. Micropuncture transitional dilator was advanced over the guidewire, with the radiopaque transition positioned at the target entrance to the peripheral right  portal vein. Next, the right internal jugular vein was accessed under direct ultrasound. Ultrasound image was saved for procedural documentation purposes. This allowed for placement of the 10 French TIPS vascular sheath. With the aid of angiographic guidewires, an MPA catheter was utilized to select the right hepatic vein and a hepatic venogram was performed.  Sheath advanced into the right hepatic vei for n. Under fluoroscopic guidance, the right portal vein with targeted with a Rsch-Uchida TIPS needle directed at the radiopaque target within the right portal vein. Ultimately, the right portal vein was accessed at a desirable location allowing advancement of a stiff Glidewire into the main portal vein. A 4 French glide catheter was advanced over the stiff Glidewire and a portal venogram was performed. Pressure measurements were obtained: Right atrium mean 21 mmHg, portal vein 36 mmHg; portosystemic gradient 15 mmHg preprocedure. Next, as there was difficulty advancing a measuring catheter through the intrahepatic track, the track was dilated with a 71m Mustang balloon. Measurements were taken for pre deployment planning. Subsequently a 7 mm Conquest balloon was used through the tract using a stiff Amplatz guidewire, allowing advancement of the sheath into the main portal vein. A portal venogram was performed. Tract further dilated with the 7 mm Conquest balloon to facilitate advancement of the sheath into the main portal vein. The percutaneous portal microcatheter and wire were removed. Next, a 2 cm (un covered) x 8 cm (covered) x 10 mm diameter GORE VIATORR TIPS Endoprosthesis was advanced through the sheath across the intra hepatic track and deployed. The TIPS stent was angioplastied in multiple stations to 10 mm diameter. Follow-up portal venogram demonstrated good stent deployment, with little antegrade flow through the portal system and stent, primary outflow via the dilated branch 2 esophageal varices. Some  intraluminal thrombus was evident. The branch to the esophageal varices was selectively catheterized with a 5 [place] catheter. The vein was embolized with 8 and 6 mm interlock 035 coils. The 10 mm mustang balloon was then used to macerate the thrombus in the central aspect of the splenic vein through the main portal vein and TIPS stent. Subsequent portal venography demonstrates occlusion of the vein to the esophageal varices. Some residual mural thrombus in the central splenic vein and portosplenic confluence , good flow through the TIPS stent with no stenosis or thrombus. Prolonged dilatation of the stenosis in the central splenic vein and portal splenic confluence was performed with the 10 mm balloon. Subsequent venography demonstrates continued good flow through the splenic vein and portosplenic confluence, wide patency of the TIPS stent, no significant stenosis. Mild residual clot in the central splenic vein. Continued opacification of posterior gastric vein. Pressure measurements were obtained: Right atrium mean 16 mmHg, portal vein 20 mmHg mean, portosystemic gradient 4 mm Hg final. All wires, catheters and  sheaths were removed from the patient. Hemostasis was achieved at the right IJ access site with manual compression. A dressing was placed. Dr. Markus Daft assisted with the latter portion of the procedure. The patient tolerated the procedure well. Patient transferred to the PACU. IMPRESSION: 1. Technically successful TIPS creation with decrease in mean portosystemic pressure from 15 to 4 mmHg. 2. Technically successful coil embolization of esophageal varices. 3. Ultrasound guided paracentesis removing 1.5 L clear yellow ascites. Electronically Signed   By: Lucrezia Europe M.D.   On: 04/09/2019 08:47   Ir Tips  Result Date: 04/09/2019 CLINICAL DATA:  Cirrhosis, portal venous hypertension, large esophageal varices, recurrent bleeding despite aggressive endoscopic treatment. EXAM: 1. TIPS CREATION 2.  ULTRASOUND-GUIDED VENOUS ACCESS 3. PARACENTESIS WITH ULTRASOUND GUIDANCE ANESTHESIA/SEDATION: General - as administered by the Anesthesia department MEDICATIONS: No periprocedural antibiotics were indicated FLUOROSCOPY TIME:  67 min , 24 sec; 280 mGy COMPLICATIONS: None immediate. PROCEDURE: Informed written consent was obtained from the patient after a thorough discussion of the procedural risks, benefits and alternatives. All questions were addressed. See previous consultation. Maximal Sterile Barrier Technique was utilized including caps, mask, sterile gowns, sterile gloves, sterile drape, hand hygiene and skin antiseptic. A timeout was performed prior to the initiation of the procedure. Patient was placed under general anesthetic by department of anesthesia personnel. The skin overlying the right upper abdominal quadrant as well as the right neck and femoral region were prepped and draped in usual sterile fashion. Initial ultrasound scanning demonstrates a moderate amount of recurrent perihepatic ascites. Under ultrasound guidance, a 6 [place] Safe-T-Centesis needle was advanced into the peritoneal cavity from a right lateral approach for paracentesis removing 1.5 L clear yellow ascites. Under direct ultrasound guidance, a peripheral aspect of the right portal vein was accessed with a 21 gauge micropuncture needle. Ultrasound image was saved for procedural documentation purposes. Guidewire advanced into the main portal vein. Micropuncture transitional dilator was advanced over the guidewire, with the radiopaque transition positioned at the target entrance to the peripheral right portal vein. Next, the right internal jugular vein was accessed under direct ultrasound. Ultrasound image was saved for procedural documentation purposes. This allowed for placement of the 10 French TIPS vascular sheath. With the aid of angiographic guidewires, an MPA catheter was utilized to select the right hepatic vein and a hepatic  venogram was performed.  Sheath advanced into the right hepatic vei for n. Under fluoroscopic guidance, the right portal vein with targeted with a Rsch-Uchida TIPS needle directed at the radiopaque target within the right portal vein. Ultimately, the right portal vein was accessed at a desirable location allowing advancement of a stiff Glidewire into the main portal vein. A 4 French glide catheter was advanced over the stiff Glidewire and a portal venogram was performed. Pressure measurements were obtained: Right atrium mean 21 mmHg, portal vein 36 mmHg; portosystemic gradient 15 mmHg preprocedure. Next, as there was difficulty advancing a measuring catheter through the intrahepatic track, the track was dilated with a 38m Mustang balloon. Measurements were taken for pre deployment planning. Subsequently a 7 mm Conquest balloon was used through the tract using a stiff Amplatz guidewire, allowing advancement of the sheath into the main portal vein. A portal venogram was performed. Tract further dilated with the 7 mm Conquest balloon to facilitate advancement of the sheath into the main portal vein. The percutaneous portal microcatheter and wire were removed. Next, a 2 cm (un covered) x 8 cm (covered) x 10 mm diameter GORE VIATORR TIPS Endoprosthesis  was advanced through the sheath across the intra hepatic track and deployed. The TIPS stent was angioplastied in multiple stations to 10 mm diameter. Follow-up portal venogram demonstrated good stent deployment, with little antegrade flow through the portal system and stent, primary outflow via the dilated branch 2 esophageal varices. Some intraluminal thrombus was evident. The branch to the esophageal varices was selectively catheterized with a 5 [place] Kumpe catheter. The vein was embolized with 8 and 6 mm interlock 035 coils. The 10 mm mustang balloon was then used to macerate the thrombus in the central aspect of the splenic vein through the main portal vein and TIPS  stent. Subsequent portal venography demonstrates occlusion of the vein to the esophageal varices. Some residual mural thrombus in the central splenic vein and portosplenic confluence , good flow through the TIPS stent with no stenosis or thrombus. Prolonged dilatation of the stenosis in the central splenic vein and portal splenic confluence was performed with the 10 mm balloon. Subsequent venography demonstrates continued good flow through the splenic vein and portosplenic confluence, wide patency of the TIPS stent, no significant stenosis. Mild residual clot in the central splenic vein. Continued opacification of posterior gastric vein. Pressure measurements were obtained: Right atrium mean 16 mmHg, portal vein 20 mmHg mean, portosystemic gradient 4 mm Hg final. All wires, catheters and sheaths were removed from the patient. Hemostasis was achieved at the right IJ access site with manual compression. A dressing was placed. Dr. Markus Daft assisted with the latter portion of the procedure. The patient tolerated the procedure well. Patient transferred to the PACU. IMPRESSION: 1. Technically successful TIPS creation with decrease in mean portosystemic pressure from 15 to 4 mmHg. 2. Technically successful coil embolization of esophageal varices. 3. Ultrasound guided paracentesis removing 1.5 L clear yellow ascites. Electronically Signed   By: Lucrezia Europe M.D.   On: 04/09/2019 08:47   [place] Guide Roadmapping  Result Date: 04/09/2019 CLINICAL DATA:  Cirrhosis, portal venous hypertension, large esophageal varices, recurrent bleeding despite aggressive endoscopic treatment. EXAM: 1. TIPS CREATION 2. ULTRASOUND-GUIDED VENOUS ACCESS 3. PARACENTESIS WITH ULTRASOUND GUIDANCE ANESTHESIA/SEDATION: General - as administered by the Anesthesia department MEDICATIONS: No periprocedural antibiotics were indicated FLUOROSCOPY TIME:  67 min , 24 sec; 433 mGy COMPLICATIONS: None immediate. PROCEDURE:  Informed written consent was obtained from the patient after a thorough discussion of the procedural risks, benefits and alternatives. All questions were addressed. See previous consultation. Maximal Sterile Barrier Technique was utilized including caps, mask, sterile gowns, sterile gloves, sterile drape, hand hygiene and skin antiseptic. A timeout was performed prior to the initiation of the procedure. Patient was placed under general anesthetic by department of anesthesia personnel. The skin overlying the right upper abdominal quadrant as well as the right neck and femoral region were prepped and draped in usual sterile fashion. Initial ultrasound scanning demonstrates a moderate amount of recurrent perihepatic ascites. Under ultrasound guidance, a 6 [place] Safe-T-Centesis needle was advanced into the peritoneal cavity from a right lateral approach for paracentesis removing 1.5 L clear yellow ascites. Under direct ultrasound guidance, a peripheral aspect of the right portal vein was accessed with a 21 gauge micropuncture needle. Ultrasound image was saved for procedural documentation purposes. Guidewire advanced into the main portal vein. Micropuncture transitional dilator was advanced over the guidewire, with the radiopaque transition positioned at the target entrance to the peripheral right portal vein. Next, the right internal jugular vein was accessed under direct ultrasound. Ultrasound image  was saved for procedural documentation purposes. This allowed for placement of the 10 French TIPS vascular sheath. With the aid of angiographic guidewires, an MPA catheter was utilized to select the right hepatic vein and a hepatic venogram was performed.  Sheath advanced into the right hepatic vei for n. Under fluoroscopic guidance, the right portal vein with targeted with a Rsch-Uchida TIPS needle directed at the radiopaque target within the right portal vein. Ultimately, the right portal vein was accessed at a  desirable location allowing advancement of a stiff Glidewire into the main portal vein. A 4 French glide catheter was advanced over the stiff Glidewire and a portal venogram was performed. Pressure measurements were obtained: Right atrium mean 21 mmHg, portal vein 36 mmHg; portosystemic gradient 15 mmHg preprocedure. Next, as there was difficulty advancing a measuring catheter through the intrahepatic track, the track was dilated with a 18m Mustang balloon. Measurements were taken for pre deployment planning. Subsequently a 7 mm Conquest balloon was used through the tract using a stiff Amplatz guidewire, allowing advancement of the sheath into the main portal vein. A portal venogram was performed. Tract further dilated with the 7 mm Conquest balloon to facilitate advancement of the sheath into the main portal vein. The percutaneous portal microcatheter and wire were removed. Next, a 2 cm (un covered) x 8 cm (covered) x 10 mm diameter GORE VIATORR TIPS Endoprosthesis was advanced through the sheath across the intra hepatic track and deployed. The TIPS stent was angioplastied in multiple stations to 10 mm diameter. Follow-up portal venogram demonstrated good stent deployment, with little antegrade flow through the portal system and stent, primary outflow via the dilated branch 2 esophageal varices. Some intraluminal thrombus was evident. The branch to the esophageal varices was selectively catheterized with a 5 [place] catheter. The vein was embolized with 8 and 6 mm interlock 035 coils. The 10 mm mustang balloon was then used to macerate the thrombus in the central aspect of the splenic vein through the main portal vein and TIPS stent. Subsequent portal venography demonstrates occlusion of the vein to the esophageal varices. Some residual mural thrombus in the central splenic vein and portosplenic confluence , good flow through the TIPS stent with no stenosis or thrombus. Prolonged dilatation of the stenosis in  the central splenic vein and portal splenic confluence was performed with the 10 mm balloon. Subsequent venography demonstrates continued good flow through the splenic vein and portosplenic confluence, wide patency of the TIPS stent, no significant stenosis. Mild residual clot in the central splenic vein. Continued opacification of posterior gastric vein. Pressure measurements were obtained: Right atrium mean 16 mmHg, portal vein 20 mmHg mean, portosystemic gradient 4 mm Hg final. All wires, catheters and sheaths were removed from the patient. Hemostasis was achieved at the right IJ access site with manual compression. A dressing was placed. Dr. AMarkus Daftassisted with the latter portion of the procedure. The patient tolerated the procedure well. Patient transferred to the PACU. IMPRESSION: 1. Technically successful TIPS creation with decrease in mean portosystemic pressure from 15 to 4 mmHg. 2. Technically successful coil embolization of esophageal varices. 3. Ultrasound guided paracentesis removing 1.5 L clear yellow ascites. Electronically Signed   By: DLucrezia EuropeM.D.   On: 04/09/2019 08:47   Ir Paracentesis  Result Date: 04/09/2019 CLINICAL DATA:  Cirrhosis, portal venous hypertension, large esophageal varices, recurrent bleeding despite aggressive endoscopic treatment. EXAM: 1. TIPS CREATION 2. ULTRASOUND-GUIDED VENOUS ACCESS 3. PARACENTESIS WITH ULTRASOUND GUIDANCE ANESTHESIA/SEDATION: General - as  administered by the Anesthesia department MEDICATIONS: No periprocedural antibiotics were indicated FLUOROSCOPY TIME:  67 min , 24 sec; 629 mGy COMPLICATIONS: None immediate. PROCEDURE: Informed written consent was obtained from the patient after a thorough discussion of the procedural risks, benefits and alternatives. All questions were addressed. See previous consultation. Maximal Sterile Barrier Technique was utilized including caps, mask, sterile gowns, sterile gloves, sterile drape, hand hygiene and skin  antiseptic. A timeout was performed prior to the initiation of the procedure. Patient was placed under general anesthetic by department of anesthesia personnel. The skin overlying the right upper abdominal quadrant as well as the right neck and femoral region were prepped and draped in usual sterile fashion. Initial ultrasound scanning demonstrates a moderate amount of recurrent perihepatic ascites. Under ultrasound guidance, a 6 [place] Safe-T-Centesis needle was advanced into the peritoneal cavity from a right lateral approach for paracentesis removing 1.5 L clear yellow ascites. Under direct ultrasound guidance, a peripheral aspect of the right portal vein was accessed with a 21 gauge micropuncture needle. Ultrasound image was saved for procedural documentation purposes. Guidewire advanced into the main portal vein. Micropuncture transitional dilator was advanced over the guidewire, with the radiopaque transition positioned at the target entrance to the peripheral right portal vein. Next, the right internal jugular vein was accessed under direct ultrasound. Ultrasound image was saved for procedural documentation purposes. This allowed for placement of the 10 French TIPS vascular sheath. With the aid of angiographic guidewires, an MPA catheter was utilized to select the right hepatic vein and a hepatic venogram was performed.  Sheath advanced into the right hepatic vei for n. Under fluoroscopic guidance, the right portal vein with targeted with a Rsch-Uchida TIPS needle directed at the radiopaque target within the right portal vein. Ultimately, the right portal vein was accessed at a desirable location allowing advancement of a stiff Glidewire into the main portal vein. A 4 French glide catheter was advanced over the stiff Glidewire and a portal venogram was performed. Pressure measurements were obtained: Right atrium mean 21 mmHg, portal vein 36 mmHg; portosystemic gradient 15 mmHg preprocedure. Next, as there was  difficulty advancing a measuring catheter through the intrahepatic track, the track was dilated with a 60m Mustang balloon. Measurements were taken for pre deployment planning. Subsequently a 7 mm Conquest balloon was used through the tract using a stiff Amplatz guidewire, allowing advancement of the sheath into the main portal vein. A portal venogram was performed. Tract further dilated with the 7 mm Conquest balloon to facilitate advancement of the sheath into the main portal vein. The percutaneous portal microcatheter and wire were removed. Next, a 2 cm (un covered) x 8 cm (covered) x 10 mm diameter GORE VIATORR TIPS Endoprosthesis was advanced through the sheath across the intra hepatic track and deployed. The TIPS stent was angioplastied in multiple stations to 10 mm diameter. Follow-up portal venogram demonstrated good stent deployment, with little antegrade flow through the portal system and stent, primary outflow via the dilated branch 2 esophageal varices. Some intraluminal thrombus was evident. The branch to the esophageal varices was selectively catheterized with a 5 [place] catheter. The vein was embolized with 8 and 6 mm interlock 035 coils. The 10 mm mustang balloon was then used to macerate the thrombus in the central aspect of the splenic vein through the main portal vein and TIPS stent. Subsequent portal venography demonstrates occlusion of the vein to the esophageal varices. Some residual mural thrombus in the central splenic vein and portosplenic confluence ,  good flow through the TIPS stent with no stenosis or thrombus. Prolonged dilatation of the stenosis in the central splenic vein and portal splenic confluence was performed with the 10 mm balloon. Subsequent venography demonstrates continued good flow through the splenic vein and portosplenic confluence, wide patency of the TIPS stent, no significant stenosis. Mild residual clot in the central splenic vein. Continued opacification of  posterior gastric vein. Pressure measurements were obtained: Right atrium mean 16 mmHg, portal vein 20 mmHg mean, portosystemic gradient 4 mm Hg final. All wires, catheters and sheaths were removed from the patient. Hemostasis was achieved at the right IJ access site with manual compression. A dressing was placed. Dr. Markus Daft assisted with the latter portion of the procedure. The patient tolerated the procedure well. Patient transferred to the PACU. IMPRESSION: 1. Technically successful TIPS creation with decrease in mean portosystemic pressure from 15 to 4 mmHg. 2. Technically successful coil embolization of esophageal varices. 3. Ultrasound guided paracentesis removing 1.5 L clear yellow ascites. Electronically Signed   By: Lucrezia Europe M.D.   On: 04/09/2019 08:47        Scheduled Meds:  Chlorhexidine Gluconate Cloth  6 each Topical Daily   [START ON 04/11/2019] pantoprazole  40 mg Intravenous Q12H   sucralfate  1 g Oral TID WC & HS   Continuous Infusions:  sodium chloride Stopped (04/10/19 0816)   cefTRIAXone (ROCEPHIN)  IV Stopped (04/09/19 1316)   dextrose 5 % and 0.45% NaCl 75 mL/hr at 04/10/19 1100   insulin 0.8 mL/hr at 04/10/19 1100   octreotide  (SANDOSTATIN)    IV infusion 50 mcg/hr (04/10/19 1100)   pantoprozole (PROTONIX) infusion Stopped (04/10/19 1055)          Aline August, MD [facility] 04/10/2019, 11:58 AM

## 2019-04-11 DIAGNOSIS — D696 Thrombocytopenia, unspecified: Secondary | ICD-10-CM | POA: Diagnosis not present

## 2019-04-11 DIAGNOSIS — I8501 Esophageal varices with bleeding: Secondary | ICD-10-CM | POA: Diagnosis not present

## 2019-04-11 DIAGNOSIS — K7460 Unspecified cirrhosis of liver: Secondary | ICD-10-CM | POA: Diagnosis not present

## 2019-04-11 DIAGNOSIS — K922 Gastrointestinal hemorrhage, unspecified: Secondary | ICD-10-CM | POA: Diagnosis not present

## 2019-04-11 LAB — BPAM RBC
Blood Product Expiration Date: 202012212359
Blood Product Expiration Date: 202012212359
Blood Product Expiration Date: 202012282359
Blood Product Expiration Date: 202012282359
ISSUE DATE / TIME: 202011291508
ISSUE DATE / TIME: 202011291731
Unit Type and Rh: 5100
Unit Type and Rh: 5100
Unit Type and Rh: 6200
Unit Type and Rh: 6200

## 2019-04-11 LAB — CBC
HCT: 35.4 % — ABNORMAL LOW (ref 36.0–46.0)
Hemoglobin: 11.9 g/dL — ABNORMAL LOW (ref 12.0–15.0)
MCH: 31.3 pg (ref 26.0–34.0)
MCHC: 33.6 g/dL (ref 30.0–36.0)
MCV: 93.2 fL (ref 80.0–100.0)
Platelets: 54 10*3/uL — ABNORMAL LOW (ref 150–400)
RBC: 3.8 MIL/uL — ABNORMAL LOW (ref 3.87–5.11)
RDW: 14.6 % (ref 11.5–15.5)
WBC: 7.9 10*3/uL (ref 4.0–10.5)
nRBC: 0 % (ref 0.0–0.2)

## 2019-04-11 LAB — TYPE AND SCREEN
ABO/RH(D): A POS
Antibody Screen: NEGATIVE
Unit division: 0
Unit division: 0
Unit division: 0
Unit division: 0

## 2019-04-11 LAB — GLUCOSE, CAPILLARY
Glucose-Capillary: 182 mg/dL — ABNORMAL HIGH (ref 70–99)
Glucose-Capillary: 197 mg/dL — ABNORMAL HIGH (ref 70–99)
Glucose-Capillary: 173 mg/dL — ABNORMAL HIGH (ref 70–99)
Glucose-Capillary: 83 mg/dL (ref 70–99)

## 2019-04-11 LAB — BASIC METABOLIC PANEL
Anion gap: 8 (ref 5–15)
BUN: 31 mg/dL — ABNORMAL HIGH (ref 8–23)
CO2: 18 mmol/L — ABNORMAL LOW (ref 22–32)
Calcium: 8.3 mg/dL — ABNORMAL LOW (ref 8.9–10.3)
Chloride: 113 mmol/L — ABNORMAL HIGH (ref 98–111)
Creatinine, Ser: 1.44 mg/dL — ABNORMAL HIGH (ref 0.44–1.00)
GFR calc Af Amer: 42 mL/min — ABNORMAL LOW (ref >60)
GFR calc non Af Amer: 36 mL/min — ABNORMAL LOW (ref >60)
Glucose, Bld: 198 mg/dL — ABNORMAL HIGH (ref 70–99)
Potassium: 3.6 mmol/L (ref 3.5–5.1)
Sodium: 139 mmol/L (ref 135–145)

## 2019-04-11 LAB — MAGNESIUM: Magnesium: 1.8 mg/dL (ref 1.7–2.4)

## 2019-04-11 NOTE — Progress Notes (Signed)
Pt arrived to unit. Oriented to room. VSS. CHG wipes applied. Call bell in reach. Will continue to monitor.  Arletta Bale, RN

## 2019-04-11 NOTE — Progress Notes (Signed)
Patient ID: [name], female   DOB: 1945/09/15, 73 y.o.   MRN: [id number]  PROGRESS NOTE    [name]  AOZ:[id number] DOB: 31-Mar-1946 DOA: 04/06/2019 PCP: Gayland Curry, DO   Brief Narrative:  73 year old female with history of NASH cirrhosis, varices status post EGD with banding on 03/28/2019, CAD, diabetes mellitus type 2 complained of not being able to eat or drink after EGD on  which she was seen by GI and underwent endoscopy which showed multiple oozing varices that were injected with epinephrine.  She was admitted to ICU for further monitoring.  She eventually underwent TIPS procedure by IR as well as coil embolization of her varices.  Assessment & Plan:   Upper GI bleeding from esophageal varices -Underwent EGD and banding of esophageal varices.  Patient continued to have GI bleeding. -Underwent TIPS procedure by IR with coil embolization of her varices -GI and IR following.  Currently on octreotide infusion.  Protonix drip has been changed to twice a day IV Protonix by GI. -Also on prophylactic Rocephin -Hemoglobin stable at 11.9 today -Diet advancement to soft diet today as per GI recommendations.  Acute metabolic acidosis -Improving.  Insulin drip has already been discontinued.  Acute kidney injury -Creatinine 1.44 today.  Patient was given 2 doses of IV albumin on 04/10/2019.  IV fluids have been discontinued. -Repeat a.m. labs.  Thrombocytopenia  -Probably from liver disease.  Worsening.  Monitor.   Diabetes mellitus type 2 uncontrolled with hyperglycemia -Insulin drip was discontinued on 04/10/2019.  Continue Lantus with CBGs and SSI.  NASH cirrhosis -Lasix and Aldactone on hold.  GI following.  Continue Rocephin for SBP prophylaxis -Required ultrasound-guided paracentesis with removal of 1.5 L of fluid  Portal vein thrombosis -Not a candidate for anticoagulation.  Per GI, chronic finding, first seen in 2018  Coronary artery disease -No  chest pain at this time.  Generalized deconditioning -PT eval.   DVT prophylaxis: SCDs Code Status: Full Family Communication: Spoke to patient at bedside Disposition Plan: Depends on clinical outcome  Consultants: GI/IR  Procedures:   TIPS  Coil embolization of esophageal varices  Ultrasound-guided paracentesis with removal of 1.5 L of fluid  EGD:- Multiple esophageal ulcers-one was actively  bleeding in the distal; injected with epinephrine  and 2 clips (MR unsafe) were placed. - Esophageal varix at 35 cm-ulcer with stigmata of  recent bleeding-banded x 1.. - Portal hypertensive gastropathy. - Normal duodenal bulb. - No specimens collected.  Antimicrobials:  Rocephin from 04/09/2019 onwards  Subjective: Patient seen and examined at bedside.  No overnight fever, vomiting or black or bloody stools.  Still feels weak and tired.  Tolerated full liquid diet yesterday.  No worsening shortness of breath.  Objective: Vitals:   04/11/19 0316 04/11/19 0400 04/11/19 0500 04/11/19 0600  BP:  99/62  (!) 96/56  Pulse:  67  66  Resp:  (!) 22  17  Temp: 97.9 F (36.6 C)     TempSrc: Oral     SpO2:  100%  98%  Weight:   76 kg   Height:        Intake/Output Summary (Last 24 hours) at 04/11/2019 0735 Last data filed at 04/11/2019 0600 Gross per 24 hour  Intake 1997.47 ml  Output 2125 ml  Net -127.53 ml   Filed Weights   04/09/19 0340 04/10/19 0500 04/11/19 0500  Weight: 68.9 kg 68.9 kg 76 kg    Examination:  General exam: No acute distress.  Looks chronically  ill.  Respiratory system: Bilateral decreased breath sounds at bases with some scattered rales, no wheezing  cardiovascular system: Rate controlled, S1-S2 heard Gastrointestinal system: Abdomen is nondistended, soft and nontender.  Normal bowel sounds heard. Extremities: No cyanosis; trace lower extremity edema Central nervous system: Alert and oriented.  Poor historian.  No focal neurological deficits. Moving extremities Skin: No rashes, lesions or ulcers Psychiatry: Extremely flat affect    Data Reviewed: I have personally reviewed following labs and imaging studies  CBC: Recent Labs  Lab 04/08/19 0608  04/08/19 2342 04/09/19 0316 04/09/19 1531 04/10/19 0835 04/11/19 0425  WBC 5.2  --  19.9* 20.4*  --  11.9* 7.9  HGB 11.1*   < > 14.3 14.4 12.6 12.0 11.9*  HCT 34.1*   < > 46.8* 46.3* 37.0 35.4* 35.4*  MCV 97.2  --  102.4* 102.0*  --  93.4 93.2  PLT 93*  --  138* 134*  --  64* 54*   < > = values in this interval not displayed.   Basic Metabolic Panel: Recent Labs  Lab 04/09/19 0316 04/09/19 1530 04/09/19 1531 04/10/19 0028 04/10/19 0641 04/11/19 0425  NA 142 137 138 136 136 139  K 4.5 4.7 4.7 4.1 3.9 3.6  CL 111 110  --  111 110 113*  CO2 <7* 13*  --  16* 17* 18*  GLUCOSE 242* 309*  --  229* 135* 198*  BUN 18 27*  --  34* 34* 31*  CREATININE 1.40* 1.75*  --  2.02* 2.09* 1.44*  CALCIUM 8.6* 8.3*  --  8.1* 8.0* 8.3*  MG  --   --   --   --   --  1.8   GFR: Estimated Creatinine Clearance: 34.5 mL/min (A) (by C-G formula based on SCr of 1.44 mg/dL (H)). Liver Function Tests: Recent Labs  Lab 04/06/19 1300 04/07/19 0350  AST 46* 36  ALT 34 29  ALKPHOS 95 71  BILITOT 1.4* 1.3*  PROT 7.3 6.0*  ALBUMIN 2.9* 2.4*   Recent Labs  Lab 04/06/19 1300  LIPASE 23   No results for input(s): AMMONIA in the last 168 hours. Coagulation Profile: Recent Labs  Lab 04/07/19 0350  INR 1.2   Cardiac Enzymes: No results for input(s): CKTOTAL, CKMB, CKMBINDEX, TROPONINI in the last 168 hours. BNP (last 3 results) No results for input(s): PROBNP in the last 8760 hours. HbA1C: No results for input(s): HGBA1C in the last 72 hours. CBG: Recent Labs  Lab 04/10/19 1406 04/10/19 1528 04/10/19 1633  04/10/19 1959 04/10/19 2303  GLUCAP 210* 222* 202* 213* 212*   Lipid Profile: No results for input(s): CHOL, HDL, LDLCALC, TRIG, CHOLHDL, LDLDIRECT in the last 72 hours. Thyroid Function Tests: No results for input(s): TSH, T4TOTAL, FREET4, T3FREE, THYROIDAB in the last 72 hours. Anemia Panel: No results for input(s): VITAMINB12, FOLATE, FERRITIN, TIBC, IRON, RETICCTPCT in the last 72 hours. Sepsis Labs: Recent Labs  Lab 04/09/19 1530 04/09/19 1818  LATICACIDVEN 4.0* 2.9*    Recent Results (from the past 240 hour(s))  SARS CORONAVIRUS 2 (TAT 6-24 HRS) Nasopharyngeal Nasopharyngeal Swab     Status: None   Collection Time: 04/01/19  3:29 PM   Specimen: Nasopharyngeal Swab  Result Value Ref Range Status   SARS Coronavirus 2 NEGATIVE NEGATIVE Final    Comment: (NOTE) SARS-CoV-2 target nucleic acids are NOT DETECTED. The SARS-CoV-2 RNA is generally detectable in upper and lower respiratory specimens during the acute phase of infection. Negative results do not preclude SARS-CoV-2  infection, do not rule out co-infections with other pathogens, and should not be used as the sole basis for treatment or other patient management decisions. Negative results must be combined with clinical observations, patient history, and epidemiological information. The expected result is Negative. Fact Sheet for Patients: SugarRoll.be Fact Sheet for Healthcare Providers: https://www.woods-mathews.com/ This test is not yet approved or cleared by the [place] FDA and  has been authorized for detection and/or diagnosis of SARS-CoV-2 by FDA under an Emergency Use Authorization (EUA). This EUA will remain  in effect (meaning this test can be used) for the duration of the COVID-19 declaration under Section 56 4(b)(1) of the Act, 21 U.S.C. section 360bbb-3(b)(1), unless the authorization is terminated or revoked sooner. Performed at [facility],  [place] [address] [place], [place] [address]   SARS CORONAVIRUS 2 (TAT 6-24 HRS) Nasopharyngeal Nasopharyngeal Swab     Status: None   Collection Time: 04/06/19  8:09 PM   Specimen: Nasopharyngeal Swab  Result Value Ref Range Status   SARS Coronavirus 2 NEGATIVE NEGATIVE Final    Comment: (NOTE) SARS-CoV-2 target nucleic acids are NOT DETECTED. The SARS-CoV-2 RNA is generally detectable in upper and lower respiratory specimens during the acute phase of infection. Negative results do not preclude SARS-CoV-2 infection, do not rule out co-infections with other pathogens, and should not be used as the sole basis for treatment or other patient management decisions. Negative results must be combined with clinical observations, patient history, and epidemiological information. The expected result is Negative. Fact Sheet for Patients: SugarRoll.be Fact Sheet for Healthcare Providers: https://www.woods-mathews.com/ This test is not yet approved or cleared by the [place] FDA and  has been authorized for detection and/or diagnosis of SARS-CoV-2 by FDA under an Emergency Use Authorization (EUA). This EUA will remain  in effect (meaning this test can be used) for the duration of the COVID-19 declaration under Section 56 4(b)(1) of the Act, 21 U.S.C. section 360bbb-3(b)(1), unless the authorization is terminated or revoked sooner. Performed at [facility], [place] [address], [place], [place] [address]   MRSA PCR Screening     Status: None   Collection Time: 04/07/19  1:42 PM   Specimen: Nasal Mucosa; Nasopharyngeal  Result Value Ref Range Status   MRSA by PCR NEGATIVE NEGATIVE Final    Comment:        The GeneXpert MRSA Assay (FDA approved for NASAL specimens only), is one component of a comprehensive MRSA colonization surveillance program. It is not intended to diagnose MRSA infection nor to guide or monitor treatment for MRSA infections.  Performed at [facility], [place] [address], [place], [place] [address]          Radiology Studies: No results found.      Scheduled Meds: . Chlorhexidine Gluconate Cloth  6 each Topical Daily  . insulin aspart  0-5 Units Subcutaneous QHS  . insulin aspart  0-9 Units Subcutaneous TID WC  . insulin detemir  20 Units Subcutaneous Daily  . pantoprazole  40 mg Intravenous Q12H  . sucralfate  1 g Oral TID WC & HS   Continuous Infusions: . cefTRIAXone (ROCEPHIN)  IV Stopped (04/10/19 1326)  . insulin Stopped (04/10/19 1516)  . octreotide  (SANDOSTATIN)    IV infusion 50 mcg/hr (04/11/19 0600)          Aline August, MD [facility] 04/11/2019, 7:35 AM

## 2019-04-11 NOTE — Progress Notes (Signed)
Inpatient Diabetes Program Recommendations  AACE/ADA: New Consensus Statement on Inpatient Glycemic Control (2015)  Target Ranges:  Prepandial:   less than 140 mg/dL      Peak postprandial:   less than 180 mg/dL (1-2 hours)      Critically ill patients:  140 - 180 mg/dL   Lab Results  Component Value Date   GLUCAP 197 (H) 04/11/2019   HGBA1C 8.4 (H) 04/01/2019    Review of Glycemic Control Results for [name], [name] (MRN [id number]) as of 04/11/2019 14:07  Ref. Range 04/10/2019 16:33 04/10/2019 19:59 04/10/2019 23:03 04/11/2019 08:19 04/11/2019 11:56  Glucose-Capillary Latest Ref Range: 70 - 99 mg/dL 202 (H) 213 (H) 212 (H) 182 (H) 197 (H)   Diabetes history: DM 2 (per H&P) Outpatient Diabetes medications:  Jaridance 25 mg daily Levemir 25 units daily Novolog 12 units q AM/8 units lunch, and 12 units supper Current orders for Inpatient glycemic control:  Novolog sensitive tid with meals and HS Levemir 20 units daily  Inpatient Diabetes Program Recommendations:    Consider holding Jardiance at d/c due to acidosis and reduced intake while in the hospital.  Will need to f/u with PCP after hospitalization as well.  Once eating consistently, may need Novolog 3 units tid with meals.   Thanks  Adah Perl, RN, BC-ADM Inpatient Diabetes Coordinator Pager [phone] (8a-5p)

## 2019-04-11 NOTE — Progress Notes (Signed)
      Progress Note   Subjective  Patient doing well. No bleeding symptoms, tolerating full liquids. Renal function better.   Objective   Vital signs in last 24 hours: Temp:  [97.3 F (36.3 C)-97.9 F (36.6 C)] 97.3 F (36.3 C) (12/03 0800) Pulse Rate:  [61-67] 65 (12/03 1000) Resp:  [16-27] 27 (12/03 1000) BP: (83-100)/(52-62) 94/52 (12/03 1000) SpO2:  [97 %-100 %] 98 % (12/03 1000) Arterial Line BP: (119-143)/(52-74) 122/74 (12/03 0400) Weight:  [76 kg] 76 kg (12/03 0500) Last BM Date: 04/09/19 General:    white female in NAD Heart:  Regular rate and rhythm; no murmurs Lungs: Respirations even and unlabored, lungs CTA bilaterally Abdomen:  Soft, nontender and nondistended.  Extremities:  trace edema. Neurologic:  Alert and oriented,  grossly normal neurologically. Psych:  Cooperative. Normal mood and affect.  Intake/Output from previous day: 12/02 0701 - 12/03 0700 In: 2022.5 [I.V.:1759.4; IV Piggyback:263.1] Out: 2125 [Urine:2125] Intake/Output this shift: Total I/O In: 275 [P.O.:200; I.V.:75] Out: 400 [Urine:400]  Lab Results: Recent Labs    04/09/19 0316 04/09/19 1531 04/10/19 0835 04/11/19 0425  WBC 20.4*  --  11.9* 7.9  HGB 14.4 12.6 12.0 11.9*  HCT 46.3* 37.0 35.4* 35.4*  PLT 134*  --  64* 54*   BMET Recent Labs    04/10/19 0028 04/10/19 0641 04/11/19 0425  NA 136 136 139  K 4.1 3.9 3.6  CL 111 110 113*  CO2 16* 17* 18*  GLUCOSE 229* 135* 198*  BUN 34* 34* 31*  CREATININE 2.02* 2.09* 1.44*  CALCIUM 8.1* 8.0* 8.3*   LFT No results for input(s): PROT, ALBUMIN, AST, ALT, ALKPHOS, BILITOT, BILIDIR, IBILI in the last 72 hours. PT/INR No results for input(s): LABPROT, INR in the last 72 hours.  Studies/Results: No results found.     Assessment / Plan:    73 y/o female with decompensated cirrhosis, history of large esophageal varices despite nadolol banded x 14 in recent weeks, history of ascites, came in with odynophagia and had an EGD  with Dr. Collene Mares a few days ago that was complicated by severe bleeding that was difficult to control. Post EGD she had persistent blood per rectum and IR took patient for TIPS 3 days ago.   Generally has done quite well. No further bleeding post TIPS, Hgb stable. She had 2 doses of albumin and diuretics held for worsening renal function and her Cr is improved today which is reassuring. I would continue to hold diuretics at this time and repeat labs tomorrow. No encephalopathy post TIPS but would monitor for this and low threshold to add lactulose.   Can stop octreotide today, convert the protonix to 33m BID. Would continue ceftriaxone while hospitalized. Can advance to soft diet today. Avoid all NSAIDs.  Overall doing much better, anticipate discharge in the next few days or so. We will coordinate outpatient follow up with Dr. JArdis Hughsper primary GI.  Will sign off for now, call with questions.  SCarolina Cellar MD [facility]

## 2019-04-12 ENCOUNTER — Encounter: Payer: Self-pay | Admitting: Internal Medicine

## 2019-04-12 ENCOUNTER — Telehealth: Payer: Self-pay | Admitting: *Deleted

## 2019-04-12 DIAGNOSIS — K7460 Unspecified cirrhosis of liver: Secondary | ICD-10-CM | POA: Diagnosis not present

## 2019-04-12 DIAGNOSIS — K922 Gastrointestinal hemorrhage, unspecified: Secondary | ICD-10-CM | POA: Diagnosis not present

## 2019-04-12 DIAGNOSIS — D696 Thrombocytopenia, unspecified: Secondary | ICD-10-CM | POA: Diagnosis not present

## 2019-04-12 DIAGNOSIS — I8501 Esophageal varices with bleeding: Secondary | ICD-10-CM | POA: Diagnosis not present

## 2019-04-12 LAB — GLUCOSE, CAPILLARY
Glucose-Capillary: 73 mg/dL (ref 70–99)
Glucose-Capillary: 192 mg/dL — ABNORMAL HIGH (ref 70–99)
Glucose-Capillary: 182 mg/dL — ABNORMAL HIGH (ref 70–99)

## 2019-04-12 LAB — CBC
HCT: 37.3 % (ref 36.0–46.0)
Hemoglobin: 12.7 g/dL (ref 12.0–15.0)
MCH: 31.4 pg (ref 26.0–34.0)
MCHC: 34 g/dL (ref 30.0–36.0)
MCV: 92.3 fL (ref 80.0–100.0)
Platelets: 56 10*3/uL — ABNORMAL LOW (ref 150–400)
RBC: 4.04 MIL/uL (ref 3.87–5.11)
RDW: 14.6 % (ref 11.5–15.5)
WBC: 8.3 10*3/uL (ref 4.0–10.5)
nRBC: 0 % (ref 0.0–0.2)

## 2019-04-12 LAB — BASIC METABOLIC PANEL
Anion gap: 10 (ref 5–15)
BUN: 20 mg/dL (ref 8–23)
CO2: 19 mmol/L — ABNORMAL LOW (ref 22–32)
Calcium: 8.6 mg/dL — ABNORMAL LOW (ref 8.9–10.3)
Chloride: 113 mmol/L — ABNORMAL HIGH (ref 98–111)
Creatinine, Ser: 0.9 mg/dL (ref 0.44–1.00)
GFR calc Af Amer: >60 mL/min (ref >60)
GFR calc non Af Amer: >60 mL/min (ref >60)
Glucose, Bld: 84 mg/dL (ref 70–99)
Potassium: 3.3 mmol/L — ABNORMAL LOW (ref 3.5–5.1)
Sodium: 142 mmol/L (ref 135–145)

## 2019-04-12 LAB — MAGNESIUM: Magnesium: 1.6 mg/dL — ABNORMAL LOW (ref 1.7–2.4)

## 2019-04-12 MED ORDER — PANTOPRAZOLE SODIUM 40 MG PO TBEC
40.0000 mg | DELAYED_RELEASE_TABLET | Freq: Two times a day (BID) | ORAL | Status: DC
  Administered 2019-04-12: 40 mg via ORAL
  Filled 2019-04-12: qty 1

## 2019-04-12 MED ORDER — INSULIN DETEMIR 100 UNIT/ML ~~LOC~~ SOLN
15.0000 [IU] | Freq: Once | SUBCUTANEOUS | Status: AC
  Administered 2019-04-12: 15 [IU] via SUBCUTANEOUS
  Filled 2019-04-12: qty 0.15

## 2019-04-12 MED ORDER — BISACODYL 10 MG RE SUPP: 10.0000 mg | Freq: Every day | RECTAL | 0 refills | Status: DC | PRN

## 2019-04-12 MED ORDER — ONDANSETRON HCL 4 MG PO TABS: 4.0000 mg | ORAL_TABLET | Freq: Four times a day (QID) | ORAL | 0 refills | Status: DC | PRN

## 2019-04-12 MED ORDER — PANTOPRAZOLE SODIUM 40 MG PO TBEC: 40.0000 mg | DELAYED_RELEASE_TABLET | Freq: Two times a day (BID) | ORAL | 0 refills | Status: DC

## 2019-04-12 MED ORDER — INSULIN DETEMIR 100 UNIT/ML ~~LOC~~ SOLN: 20.0000 [IU] | Freq: Every day | SUBCUTANEOUS | Status: DC

## 2019-04-12 MED ORDER — MAGNESIUM SULFATE 2 GM/50ML IV SOLN
2.0000 g | Freq: Once | INTRAVENOUS | Status: AC
  Administered 2019-04-12: 09:00:00 2 g via INTRAVENOUS
  Filled 2019-04-12: qty 50

## 2019-04-12 MED ORDER — POTASSIUM CHLORIDE CRYS ER 20 MEQ PO TBCR
40.0000 meq | EXTENDED_RELEASE_TABLET | Freq: Once | ORAL | Status: AC
  Administered 2019-04-12: 40 meq via ORAL
  Filled 2019-04-12: qty 2

## 2019-04-12 MED ORDER — INSULIN DETEMIR 100 UNIT/ML ~~LOC~~ SOLN: 15.0000 [IU] | Freq: Once | SUBCUTANEOUS | Status: DC

## 2019-04-12 NOTE — Telephone Encounter (Signed)
Transition Care Management Follow-up Telephone Call  Date of discharge and from where:04/12/2019 [place]   How have you been since you were released from the hospital? Better, slow  Any questions or concerns? No   Items Reviewed:  Did the pt receive and understand the discharge instructions provided? Yes   Medications obtained and verified? Yes   Any new allergies since your discharge? No   Dietary orders reviewed? Yes  Do you have support at home? Yes   Other (ie: DME, Home Health, etc) no home health  Functional Questionnaire: (I = Independent and D = Dependent) ADL's: I with assistance  Bathing/Dressing- I   Meal Prep- I  Eating- I  Maintaining continence- I  Transferring/Ambulation- I with assistance  Managing Meds- I   Follow up appointments reviewed:    PCP Hospital f/u appt confirmed? Yes  Scheduled to see Dinah on 04/19/2019 .  [facility] f/u appt confirmed? No   Are transportation arrangements needed? No   If their condition worsens, is the pt aware to call  their PCP or go to the ED? Yes  Was the patient provided with contact information for the PCP's office or ED? Yes  Was the pt encouraged to call back with questions or concerns? Yes

## 2019-04-12 NOTE — Progress Notes (Signed)
Verbal order received from [facility] MD for a one time dose of Levemir 15 units subq.

## 2019-04-12 NOTE — Evaluation (Addendum)
Physical Therapy Evaluation Patient Details Name: [name] MRN: [id number] DOB: [birth year] Today's Date: 04/12/2019   History of Present Illness  Patient is a 73 y/o female who presents with difficulty swallowing, emesis, RUQ pain post esophageal bands 11/19. Found to have esophageal varices with recurrent bleeding s/p EGD with bands and clips placed 11/29. s/p TIPs, paracentesis and emboli of esophageal varices 11/30. PMH includes back surgery, DM, CKD.  Clinical Impression  Patient presents with generalized weakness, ?impaired cognition, impaired balance and decreased sensation right thigh impacting mobility. Pt independent PTA and lives with spouse. Today, tolerated gait training with Min guard assist for balance/safety with use of RW for support. VSS throughout. Slow, guarded movement throughout and able to stand at sink for 3-4 mins changing gown without LOB. Pt has support from spouse at home. Question memory deficits? Not sure what is baseline with regards to cognition. Will follow acutely to maximize independence and mobility prior to return home. Recommend use of RW at home for safety.     Follow Up Recommendations Home health PT;Supervision - Intermittent    Equipment Recommendations  None recommended by PT    Recommendations for Other Services       Precautions / Restrictions Precautions Precautions: Fall Restrictions Weight Bearing Restrictions: No      Mobility  Bed Mobility               General bed mobility comments: Up at sink upon PT arrival.  Transfers Overall transfer level: Needs assistance Equipment used: Rolling walker (2 wheeled) Transfers: Sit to/from Stand Sit to Stand: Min guard         General transfer comment: Able to return to chair without issues.  Ambulation/Gait Ambulation/Gait assistance: Min guard Gait Distance (Feet): 25 Feet Assistive device: Rolling walker (2 wheeled) Gait Pattern/deviations: Step-through  pattern;Decreased stride length Gait velocity: decreased   General Gait Details: Slow, guarded gait using RW for support.  Stairs            Wheelchair Mobility    Modified Rankin (Stroke Patients Only)       Balance Overall balance assessment: Mild deficits observed, not formally tested                                           Pertinent Vitals/Pain Pain Assessment: No/denies pain    Home Living Family/patient expects to be discharged to:: Private residence Living Arrangements: Spouse/significant other Available Help at Discharge: Family Type of Home: House Home Access: Stairs to enter Entrance Stairs-Rails: Right Entrance Stairs-Number of Steps: 3 Home Layout: One level Home Equipment: Walker - 2 wheels      Prior Function Level of Independence: Independent         Comments: Does own ADLs, cooks/cleans, drives.     Hand Dominance   Dominant Hand: Right    Extremity/Trunk Assessment   Upper Extremity Assessment Upper Extremity Assessment: Defer to OT evaluation    Lower Extremity Assessment Lower Extremity Assessment: RLE deficits/detail;Generalized weakness RLE Sensation: decreased light touch(from hip to knee)       Communication   Communication: HOH  Cognition Arousal/Alertness: Awake/alert Behavior During Therapy: WFL for tasks assessed/performed Overall Cognitive Status: No family/caregiver present to determine baseline cognitive functioning  General Comments: for basic mobility tasks. A&Ox3, states she is here for a liver stent? Likely with memory issues.      General Comments General comments (skin integrity, edema, etc.): VSS.    Exercises     Assessment/Plan    PT Assessment Patient needs continued PT services  PT Problem List Decreased mobility;Decreased strength;Decreased cognition       PT Treatment Interventions Therapeutic activities;Gait  training;Therapeutic exercise;Patient/family education;Balance training;Functional mobility training;Cognitive remediation    PT Goals (Current goals can be found in the Care Plan section)  Acute Rehab PT Goals Patient Stated Goal: to go home PT Goal Formulation: With patient Time For Goal Achievement: 04/26/19 Potential to Achieve Goals: Good    Frequency Min 3X/week   Barriers to discharge        Co-evaluation               AM-PAC PT "6 Clicks" Mobility  Outcome Measure Help needed turning from your back to your side while in a flat bed without using bedrails?: A Little Help needed moving from lying on your back to sitting on the side of a flat bed without using bedrails?: A Little Help needed moving to and from a bed to a chair (including a wheelchair)?: A Little Help needed standing up from a chair using your arms (e.g., wheelchair or bedside chair)?: A Little Help needed to walk in hospital room?: A Little Help needed climbing 3-5 steps with a railing? : A Little 6 Click Score: 18    End of Session Equipment Utilized During Treatment: Gait belt Activity Tolerance: Patient tolerated treatment well Patient left: in chair;with call bell/phone within reach Nurse Communication: Mobility status(per tech, not alarm pad needed) PT Visit Diagnosis: Muscle weakness (generalized) (M62.81)    Time: 2637-8588 PT Time Calculation (min) (ACUTE ONLY): 16 min   Charges:   PT Evaluation $PT Eval Moderate Complexity: 1 Mod          Marisa Severin, PT, DPT Acute Rehabilitation Services Pager [phone] Office [phone]      Marguarite Arbour A [place] 04/12/2019, 10:00 AM

## 2019-04-12 NOTE — Plan of Care (Signed)
  Problem: Education: Goal: Knowledge of General Education information will improve Description: Including pain rating scale, medication(s)/side effects and non-pharmacologic comfort measures Outcome: Completed/Met   Problem: Health Behavior/Discharge Planning: Goal: Ability to manage health-related needs will improve Outcome: Completed/Met   Problem: Clinical Measurements: Goal: Ability to maintain clinical measurements within normal limits will improve Outcome: Completed/Met Goal: Will remain free from infection Outcome: Completed/Met Goal: Diagnostic test results will improve Outcome: Completed/Met Goal: Respiratory complications will improve Outcome: Completed/Met Goal: Cardiovascular complication will be avoided Outcome: Completed/Met   Problem: Nutrition: Goal: Adequate nutrition will be maintained Outcome: Completed/Met   Problem: Activity: Goal: Risk for activity intolerance will decrease Outcome: Completed/Met   Problem: Coping: Goal: Level of anxiety will decrease Outcome: Completed/Met   Problem: Elimination: Goal: Will not experience complications related to bowel motility Outcome: Completed/Met Goal: Will not experience complications related to urinary retention Outcome: Completed/Met   Problem: Pain Managment: Goal: General experience of comfort will improve Outcome: Completed/Met   Problem: Safety: Goal: Ability to remain free from injury will improve Outcome: Completed/Met   Problem: Skin Integrity: Goal: Risk for impaired skin integrity will decrease Outcome: Completed/Met

## 2019-04-12 NOTE — Plan of Care (Signed)
?  Problem: Clinical Measurements: ?Goal: Will remain free from infection ?Outcome: Progressing ?Goal: Diagnostic test results will improve ?Outcome: Progressing ?Goal: Respiratory complications will improve ?Outcome: Progressing ?  ?

## 2019-04-12 NOTE — Discharge Summary (Addendum)
Physician Discharge Summary  [name]:[id number] DOB: 06-May-1946 DOA: 04/06/2019  PCP: Gayland Curry, DO  Admit date: 04/06/2019 Discharge date: 04/12/2019  Admitted From: Home Disposition: Home  Recommendations for Outpatient Follow-up:  1. Follow up with PCP in 1 week with repeat CBC/BMP 2. Outpatient follow-up with GI 3. Follow up in ED if symptoms worsen or new appear   Home Health: No Equipment/Devices: None  Discharge Condition: Stable CODE STATUS: Full Diet recommendation: Heart healthy/carb modified  Brief/Interim Summary: 73 year old female with history of NASH cirrhosis, varices status post EGD with banding on 03/28/2019, CAD, diabetes mellitus type 2 complained of not being able to eat or drink after EGD on  which she was seen by GI and underwent endoscopy which showed multiple oozing varices that were injected with epinephrine.  She was admitted to ICU for further monitoring.  She eventually underwent TIPS procedure by IR as well as coil embolization of her varices.  Subsequently, her bleeding stopped.  Her hemoglobin has remained stable.  She is tolerating diet.  GI has signed off.  She will be discharged home today with outpatient follow-up with GI and PCP.   Discharge Diagnoses:  Upper GI bleeding from esophageal varices -Underwent EGD and banding of esophageal varices.  Patient continued to have GI bleeding. -Underwent TIPS procedure by IR with coil embolization of her varices -Treated with octreotide and Protonix drips.  Subsequently octreotide was discontinued on 2020.  Protonix was switched to IV twice a day Protonix. -Also on prophylactic Rocephin -Hemoglobin stable at 12.7 today -Diet has been advanced and patient is tolerating diet.  No further bleeding episodes.  GI has signed off and recommended outpatient follow-up with GI.  We will continue to hold diuretics on discharge. -We will discharge the patient today with outpatient  follow-up with PCP and GI with repeat CBC/BMP.  Acute metabolic acidosis -Improving.  Insulin drip has already been discontinued.  Acute kidney injury -Resolved. Patient was given 2 doses of IV albumin on 04/10/2019.  IV fluids have been discontinued. -Outpatient follow-up  Thrombocytopenia  -Probably from liver disease.    Outpatient follow-up.   Diabetes mellitus type 2 uncontrolled with hyperglycemia -Insulin drip was discontinued on 04/10/2019.  Blood sugars on the lower side but stable.  Will decrease Lantus to 20 units on discharge.  Insulin regimen can be adjusted by PCP as an outpatient.  Will DC Jardiance on discharge because of issues with acidosis during the hospitalization.  NASH cirrhosis -Lasix and Aldactone on hold.    Outpatient follow-up with GI.  Continue nadolol. -Required ultrasound-guided paracentesis with removal of 1.5 L of fluid  Portal vein thrombosis -Not a candidate for anticoagulation.  Per GI, chronic finding, first seen in 2018  Coronary artery disease -No chest pain at this time.  Generalized deconditioning -PT eval is pending.  Patient states that he feels fine and is walking to the bathroom okay.  Discharge Instructions  Discharge Instructions    Ambulatory referral to Gastroenterology   Complete by: As directed    Diet - low sodium heart healthy   Complete by: As directed    Diet Carb Modified   Complete by: As directed    Increase activity slowly   Complete by: As directed      Allergies as of 04/12/2019      Reactions   Kiwi Extract Anaphylaxis   Tdap [tetanus-diphth-acell Pertussis] Swelling, Other (See Comments)   Swelling at injection site, gets very hot   Statins  RHABDOMYOLYSIS   Latex Itching, Dermatitis, Rash   Tramadol Nausea And Vomiting      Medication List    STOP taking these medications   aspirin EC 81 MG tablet   furosemide 40 MG tablet Commonly known as: LASIX   HYDROcodone-acetaminophen 7.5-325  mg/15 ml solution Commonly known as: HYCET   Jardiance 25 MG Tabs tablet Generic drug: empagliflozin   spironolactone 100 MG tablet Commonly known as: ALDACTONE     TAKE these medications   acetaminophen 500 MG tablet Commonly known as: TYLENOL Take 500 mg by mouth at bedtime.   BD Pen Needle Nano U/F 32G X 4 MM Misc Generic drug: Insulin Pen Needle USE THREE TIMES DAILY AS DIRECTED   Biotin 10000 MCG Tabs Take 10,000 mcg by mouth every morning.   bisacodyl 10 MG suppository Commonly known as: DULCOLAX Place 1 suppository (10 mg total) rectally daily as needed for moderate constipation.   calcium carbonate 600 MG Tabs tablet Commonly known as: OS-CAL Take 600 mg by mouth 2 (two) times daily with a meal.   ezetimibe 10 MG tablet Commonly known as: ZETIA TAKE 1 TABLET(10 MG) BY MOUTH DAILY What changed: See the new instructions.   glucose blood test strip One Touch Ultra II strips. Use to test blood sugar three times daily. Dx: E11.65   insulin detemir 100 UNIT/ML injection Commonly known as: Levemir Inject 0.2 mLs (20 Units total) into the skin at bedtime. Start taking on: April 13, 2019 What changed:   how much to take  additional instructions   INSULIN SYRINGE 1CC/31GX5/16" 31G X 5/16" 1 ML Misc USE AS DIRECTED DAILY WITH LEVEMIR   lidocaine 2 % solution Commonly known as: XYLOCAINE Use as directed 15 mLs in the mouth or throat 3 (three) times daily before meals for 14 days.   loratadine 10 MG tablet Commonly known as: CLARITIN Take 10 mg by mouth daily as needed for allergies.   MAGNESIUM PO Take 500 mg by mouth 2 (two) times daily in the am and at bedtime..   multivitamin with minerals tablet Take 1 tablet by mouth daily.   nadolol 40 MG tablet Commonly known as: CORGARD Take 1 tablet (40 mg total) by mouth daily.   NovoLOG FlexPen 100 UNIT/ML FlexPen Generic drug: insulin aspart Inject 12 units under the skin every morning, 8 units at lunch  and 12 units at supper What changed:   how much to take  how to take this  when to take this   ondansetron 4 MG tablet Commonly known as: ZOFRAN Take 1 tablet (4 mg total) by mouth every 6 (six) hours as needed for nausea.   pantoprazole 40 MG tablet Commonly known as: Protonix Take 1 tablet (40 mg total) by mouth 2 (two) times daily. What changed: when to take this   potassium chloride SA 20 MEQ tablet Commonly known as: KLOR-CON Take 1 tablet (20 mEq total) by mouth daily.   PROBIOTIC DAILY PO Take 1 capsule by mouth daily. Digestive Advantage Probiotic   sucralfate 1 GM/10ML suspension Commonly known as: CARAFATE Take 10 mLs (1 g total) by mouth 4 (four) times daily -  with meals and at bedtime for 14 days.   SYSTANE OP Place 1 drop into both eyes 2 (two) times daily.   VICKS VAPOR IN Vicks Vapor Rub apply small amount to outside of nose to help breathing   VITAMIN B 12 PO Take 1,000 mcg by mouth daily.   Vitamin D 50 MCG (  2000 UT) Caps Take 2,000 Units by mouth daily.       Follow-up Information    Reed, Tiffany L, DO. Schedule an appointment as soon as possible for a visit in 1 week(s).   Specialty: Geriatric Medicine Why: With repeat CBC/BMP Contact information: [address] [place] [address] [phone]        [name], MD. Schedule an appointment as soon as possible for a visit in 1 week(s).   Specialty: Gastroenterology Contact information: [address] [address] [phone]          Allergies  Allergen Reactions  . Kiwi Extract Anaphylaxis  . Tdap [Tetanus-Diphth-Acell Pertussis] Swelling and Other (See Comments)    Swelling at injection site, gets very hot  . Statins     RHABDOMYOLYSIS  . Latex Itching, Dermatitis and Rash  . Tramadol Nausea And Vomiting    Consultations: GI/IR   Procedures/Studies: Ct Abdomen Pelvis W Contrast  Result Date: 04/06/2019 CLINICAL DATA:  Patient with vomiting,  diarrhea and right upper quadrant pain. Status post esophageal band. EXAM: CT ABDOMEN AND PELVIS WITH CONTRAST TECHNIQUE: Multidetector CT imaging of the abdomen and pelvis was performed using the standard protocol following bolus administration of intravenous contrast. CONTRAST:  142m OMNIPAQUE IOHEXOL 300 MG/ML  SOLN COMPARISON:  MRI abdomen 04/07/2017 FINDINGS: Lower chest: Normal heart size. Dependent atelectasis within the bilateral lower lobes. No pleural effusion. Hepatobiliary: Morphologic changes to the liver compatible with cirrhosis. Heterogeneous attenuation of the liver. Mild gallbladder wall thickening, likely secondary to cirrhosis. No definite intrahepatic or extrahepatic biliary ductal dilatation. The portal vein is attenuated centrally (image 22; series 3) most compatible thrombus. Pancreas: Unremarkable Spleen: Enlarged and heterogeneous in attenuation. And like low-attenuation centrally. Splenic infarct not entirely excluded. Adrenals/Urinary Tract: Normal adrenal glands. Kidneys enhance symmetrically with contrast. No hydronephrosis. Urinary bladder is unremarkable. Stomach/Bowel: There is circumferential wall thickening of the cecum and ascending colon. No evidence for small bowel obstruction. Normal morphology of the stomach. Small amount of fluid within the small bowel mesentery. Paraesophageal varices. Vascular/Lymphatic: Normal caliber abdominal aorta. Peripheral calcified atherosclerotic plaque. No retroperitoneal lymphadenopathy. Reproductive: Status post hysterectomy. Other: Moderate volume ascites throughout the abdomen. Musculoskeletal: Lumbar spine degenerative changes. Lumbar spinal fusion hardware. No aggressive or acute appearing osseous lesions. IMPRESSION: 1. The portal vein is attenuated centrally most compatible with portal venous thrombus which appears to have progressed from prior exam. 2. There is circumferential wall thickening of the cecum and ascending colon which may be  secondary to portal venous hypertension or colitis. 3. Morphologic changes to the liver compatible with cirrhosis. Heterogeneous attenuation of the liver which may be secondary to cirrhosis. 4. Bandlike heterogeneity of the spleen raising the possibility of splenic infarct. 5. Moderate volume ascites throughout the abdomen and pelvis. 6. Paraesophageal varices. Electronically Signed   By: DLovey NewcomerM.D.   On: 04/06/2019 15:56   Ir Angiogram Selective Each Additional Vessel  Result Date: 04/09/2019 CLINICAL DATA:  Cirrhosis, portal venous hypertension, large esophageal varices, recurrent bleeding despite aggressive endoscopic treatment. EXAM: 1. TIPS CREATION 2. ULTRASOUND-GUIDED VENOUS ACCESS 3. PARACENTESIS WITH ULTRASOUND GUIDANCE ANESTHESIA/SEDATION: General - as administered by the Anesthesia department MEDICATIONS: No periprocedural antibiotics were indicated FLUOROSCOPY TIME:  67 min , 24 sec; 4027mGy COMPLICATIONS: None immediate. PROCEDURE: Informed written consent was obtained from the patient after a thorough discussion of the procedural risks, benefits and alternatives. All questions were addressed. See previous consultation. Maximal Sterile Barrier Technique was utilized including  caps, mask, sterile gowns, sterile gloves, sterile drape, hand hygiene and skin antiseptic. A timeout was performed prior to the initiation of the procedure. Patient was placed under general anesthetic by department of anesthesia personnel. The skin overlying the right upper abdominal quadrant as well as the right neck and femoral region were prepped and draped in usual sterile fashion. Initial ultrasound scanning demonstrates a moderate amount of recurrent perihepatic ascites. Under ultrasound guidance, a 6 [place] Safe-T-Centesis needle was advanced into the peritoneal cavity from a right lateral approach for paracentesis removing 1.5 L clear yellow ascites. Under direct ultrasound guidance, a peripheral aspect of the  right portal vein was accessed with a 21 gauge micropuncture needle. Ultrasound image was saved for procedural documentation purposes. Guidewire advanced into the main portal vein. Micropuncture transitional dilator was advanced over the guidewire, with the radiopaque transition positioned at the target entrance to the peripheral right portal vein. Next, the right internal jugular vein was accessed under direct ultrasound. Ultrasound image was saved for procedural documentation purposes. This allowed for placement of the 10 French TIPS vascular sheath. With the aid of angiographic guidewires, an MPA catheter was utilized to select the right hepatic vein and a hepatic venogram was performed.  Sheath advanced into the right hepatic vei for n. Under fluoroscopic guidance, the right portal vein with targeted with a Rsch-Uchida TIPS needle directed at the radiopaque target within the right portal vein. Ultimately, the right portal vein was accessed at a desirable location allowing advancement of a stiff Glidewire into the main portal vein. A 4 French glide catheter was advanced over the stiff Glidewire and a portal venogram was performed. Pressure measurements were obtained: Right atrium mean 21 mmHg, portal vein 36 mmHg; portosystemic gradient 15 mmHg preprocedure. Next, as there was difficulty advancing a measuring catheter through the intrahepatic track, the track was dilated with a 42m Mustang balloon. Measurements were taken for pre deployment planning. Subsequently a 7 mm Conquest balloon was used through the tract using a stiff Amplatz guidewire, allowing advancement of the sheath into the main portal vein. A portal venogram was performed. Tract further dilated with the 7 mm Conquest balloon to facilitate advancement of the sheath into the main portal vein. The percutaneous portal microcatheter and wire were removed. Next, a 2 cm (un covered) x 8 cm (covered) x 10 mm diameter GORE VIATORR TIPS Endoprosthesis was  advanced through the sheath across the intra hepatic track and deployed. The TIPS stent was angioplastied in multiple stations to 10 mm diameter. Follow-up portal venogram demonstrated good stent deployment, with little antegrade flow through the portal system and stent, primary outflow via the dilated branch 2 esophageal varices. Some intraluminal thrombus was evident. The branch to the esophageal varices was selectively catheterized with a 5 [place] catheter. The vein was embolized with 8 and 6 mm interlock 035 coils. The 10 mm mustang balloon was then used to macerate the thrombus in the central aspect of the splenic vein through the main portal vein and TIPS stent. Subsequent portal venography demonstrates occlusion of the vein to the esophageal varices. Some residual mural thrombus in the central splenic vein and portosplenic confluence , good flow through the TIPS stent with no stenosis or thrombus. Prolonged dilatation of the stenosis in the central splenic vein and portal splenic confluence was performed with the 10 mm balloon. Subsequent venography demonstrates continued good flow through the splenic vein and portosplenic confluence, wide patency of the TIPS stent, no significant stenosis. Mild residual clot  in the central splenic vein. Continued opacification of posterior gastric vein. Pressure measurements were obtained: Right atrium mean 16 mmHg, portal vein 20 mmHg mean, portosystemic gradient 4 mm Hg final. All wires, catheters and sheaths were removed from the patient. Hemostasis was achieved at the right IJ access site with manual compression. A dressing was placed. Dr. Markus Daft assisted with the latter portion of the procedure. The patient tolerated the procedure well. Patient transferred to the PACU. IMPRESSION: 1. Technically successful TIPS creation with decrease in mean portosystemic pressure from 15 to 4 mmHg. 2. Technically successful coil embolization of esophageal varices. 3.  Ultrasound guided paracentesis removing 1.5 L clear yellow ascites. Electronically Signed   By: Lucrezia Europe M.D.   On: 04/09/2019 08:47   Ir Tips  Result Date: 04/09/2019 CLINICAL DATA:  Cirrhosis, portal venous hypertension, large esophageal varices, recurrent bleeding despite aggressive endoscopic treatment. EXAM: 1. TIPS CREATION 2. ULTRASOUND-GUIDED VENOUS ACCESS 3. PARACENTESIS WITH ULTRASOUND GUIDANCE ANESTHESIA/SEDATION: General - as administered by the Anesthesia department MEDICATIONS: No periprocedural antibiotics were indicated FLUOROSCOPY TIME:  67 min , 24 sec; 469 mGy COMPLICATIONS: None immediate. PROCEDURE: Informed written consent was obtained from the patient after a thorough discussion of the procedural risks, benefits and alternatives. All questions were addressed. See previous consultation. Maximal Sterile Barrier Technique was utilized including caps, mask, sterile gowns, sterile gloves, sterile drape, hand hygiene and skin antiseptic. A timeout was performed prior to the initiation of the procedure. Patient was placed under general anesthetic by department of anesthesia personnel. The skin overlying the right upper abdominal quadrant as well as the right neck and femoral region were prepped and draped in usual sterile fashion. Initial ultrasound scanning demonstrates a moderate amount of recurrent perihepatic ascites. Under ultrasound guidance, a 6 [place] Safe-T-Centesis needle was advanced into the peritoneal cavity from a right lateral approach for paracentesis removing 1.5 L clear yellow ascites. Under direct ultrasound guidance, a peripheral aspect of the right portal vein was accessed with a 21 gauge micropuncture needle. Ultrasound image was saved for procedural documentation purposes. Guidewire advanced into the main portal vein. Micropuncture transitional dilator was advanced over the guidewire, with the radiopaque transition positioned at the target entrance to the peripheral right  portal vein. Next, the right internal jugular vein was accessed under direct ultrasound. Ultrasound image was saved for procedural documentation purposes. This allowed for placement of the 10 French TIPS vascular sheath. With the aid of angiographic guidewires, an MPA catheter was utilized to select the right hepatic vein and a hepatic venogram was performed.  Sheath advanced into the right hepatic vei for n. Under fluoroscopic guidance, the right portal vein with targeted with a Rsch-Uchida TIPS needle directed at the radiopaque target within the right portal vein. Ultimately, the right portal vein was accessed at a desirable location allowing advancement of a stiff Glidewire into the main portal vein. A 4 French glide catheter was advanced over the stiff Glidewire and a portal venogram was performed. Pressure measurements were obtained: Right atrium mean 21 mmHg, portal vein 36 mmHg; portosystemic gradient 15 mmHg preprocedure. Next, as there was difficulty advancing a measuring catheter through the intrahepatic track, the track was dilated with a 1m Mustang balloon. Measurements were taken for pre deployment planning. Subsequently a 7 mm Conquest balloon was used through the tract using a stiff Amplatz guidewire, allowing advancement of the sheath into the main portal vein. A portal venogram was performed. Tract further dilated with the 7 mm Conquest balloon to facilitate  advancement of the sheath into the main portal vein. The percutaneous portal microcatheter and wire were removed. Next, a 2 cm (un covered) x 8 cm (covered) x 10 mm diameter GORE VIATORR TIPS Endoprosthesis was advanced through the sheath across the intra hepatic track and deployed. The TIPS stent was angioplastied in multiple stations to 10 mm diameter. Follow-up portal venogram demonstrated good stent deployment, with little antegrade flow through the portal system and stent, primary outflow via the dilated branch 2 esophageal varices. Some  intraluminal thrombus was evident. The branch to the esophageal varices was selectively catheterized with a 5 [place] Kumpe catheter. The vein was embolized with 8 and 6 mm interlock 035 coils. The 10 mm mustang balloon was then used to macerate the thrombus in the central aspect of the splenic vein through the main portal vein and TIPS stent. Subsequent portal venography demonstrates occlusion of the vein to the esophageal varices. Some residual mural thrombus in the central splenic vein and portosplenic confluence , good flow through the TIPS stent with no stenosis or thrombus. Prolonged dilatation of the stenosis in the central splenic vein and portal splenic confluence was performed with the 10 mm balloon. Subsequent venography demonstrates continued good flow through the splenic vein and portosplenic confluence, wide patency of the TIPS stent, no significant stenosis. Mild residual clot in the central splenic vein. Continued opacification of posterior gastric vein. Pressure measurements were obtained: Right atrium mean 16 mmHg, portal vein 20 mmHg mean, portosystemic gradient 4 mm Hg final. All wires, catheters and sheaths were removed from the patient. Hemostasis was achieved at the right IJ access site with manual compression. A dressing was placed. Dr. Markus Daft assisted with the latter portion of the procedure. The patient tolerated the procedure well. Patient transferred to the PACU. IMPRESSION: 1. Technically successful TIPS creation with decrease in mean portosystemic pressure from 15 to 4 mmHg. 2. Technically successful coil embolization of esophageal varices. 3. Ultrasound guided paracentesis removing 1.5 L clear yellow ascites. Electronically Signed   By: Lucrezia Europe M.D.   On: 04/09/2019 08:47   Xr Chest Single View  Result Date: 04/01/2019 CLINICAL DATA:  Epigastric pain. Additional history provided: Patient reports being unable to swallow even small amounts. EXAM: PORTABLE CHEST 1 VIEW  COMPARISON:  Fluoroscopic esophagram 03/18/2019, prior chest radiograph 03/29/2016 FINDINGS: Sequela prior median sternotomy/CABG. Heart size within normal limits. Aortic atherosclerosis No airspace consolidation No evidence of pleural effusion or pneumothorax. No acute bony abnormality. Overlying cardiac monitoring leads. IMPRESSION: No airspace consolidation Electronically Signed   By: Kellie Simmering DO   On: 04/01/2019 14:07   [place] Guide Roadmapping  Result Date: 04/09/2019 CLINICAL DATA:  Cirrhosis, portal venous hypertension, large esophageal varices, recurrent bleeding despite aggressive endoscopic treatment. EXAM: 1. TIPS CREATION 2. ULTRASOUND-GUIDED VENOUS ACCESS 3. PARACENTESIS WITH ULTRASOUND GUIDANCE ANESTHESIA/SEDATION: General - as administered by the Anesthesia department MEDICATIONS: No periprocedural antibiotics were indicated FLUOROSCOPY TIME:  67 min , 24 sec; 161 mGy COMPLICATIONS: None immediate. PROCEDURE: Informed written consent was obtained from the patient after a thorough discussion of the procedural risks, benefits and alternatives. All questions were addressed. See previous consultation. Maximal Sterile Barrier Technique was utilized including caps, mask, sterile gowns, sterile gloves, sterile drape, hand hygiene and skin antiseptic. A timeout was performed prior to the initiation of the procedure. Patient was placed under general anesthetic by department of anesthesia personnel. The skin overlying the right upper abdominal quadrant as well  as the right neck and femoral region were prepped and draped in usual sterile fashion. Initial ultrasound scanning demonstrates a moderate amount of recurrent perihepatic ascites. Under ultrasound guidance, a 6 [place] Safe-T-Centesis needle was advanced into the peritoneal cavity from a right lateral approach for paracentesis removing 1.5 L clear yellow ascites. Under direct ultrasound guidance, a peripheral  aspect of the right portal vein was accessed with a 21 gauge micropuncture needle. Ultrasound image was saved for procedural documentation purposes. Guidewire advanced into the main portal vein. Micropuncture transitional dilator was advanced over the guidewire, with the radiopaque transition positioned at the target entrance to the peripheral right portal vein. Next, the right internal jugular vein was accessed under direct ultrasound. Ultrasound image was saved for procedural documentation purposes. This allowed for placement of the 10 French TIPS vascular sheath. With the aid of angiographic guidewires, an MPA catheter was utilized to select the right hepatic vein and a hepatic venogram was performed.  Sheath advanced into the right hepatic vei for n. Under fluoroscopic guidance, the right portal vein with targeted with a Rsch-Uchida TIPS needle directed at the radiopaque target within the right portal vein. Ultimately, the right portal vein was accessed at a desirable location allowing advancement of a stiff Glidewire into the main portal vein. A 4 French glide catheter was advanced over the stiff Glidewire and a portal venogram was performed. Pressure measurements were obtained: Right atrium mean 21 mmHg, portal vein 36 mmHg; portosystemic gradient 15 mmHg preprocedure. Next, as there was difficulty advancing a measuring catheter through the intrahepatic track, the track was dilated with a 56m Mustang balloon. Measurements were taken for pre deployment planning. Subsequently a 7 mm Conquest balloon was used through the tract using a stiff Amplatz guidewire, allowing advancement of the sheath into the main portal vein. A portal venogram was performed. Tract further dilated with the 7 mm Conquest balloon to facilitate advancement of the sheath into the main portal vein. The percutaneous portal microcatheter and wire were removed. Next, a 2 cm (un covered) x 8 cm (covered) x 10 mm diameter GORE VIATORR TIPS  Endoprosthesis was advanced through the sheath across the intra hepatic track and deployed. The TIPS stent was angioplastied in multiple stations to 10 mm diameter. Follow-up portal venogram demonstrated good stent deployment, with little antegrade flow through the portal system and stent, primary outflow via the dilated branch 2 esophageal varices. Some intraluminal thrombus was evident. The branch to the esophageal varices was selectively catheterized with a 5 [place] catheter. The vein was embolized with 8 and 6 mm interlock 035 coils. The 10 mm mustang balloon was then used to macerate the thrombus in the central aspect of the splenic vein through the main portal vein and TIPS stent. Subsequent portal venography demonstrates occlusion of the vein to the esophageal varices. Some residual mural thrombus in the central splenic vein and portosplenic confluence , good flow through the TIPS stent with no stenosis or thrombus. Prolonged dilatation of the stenosis in the central splenic vein and portal splenic confluence was performed with the 10 mm balloon. Subsequent venography demonstrates continued good flow through the splenic vein and portosplenic confluence, wide patency of the TIPS stent, no significant stenosis. Mild residual clot in the central splenic vein. Continued opacification of posterior gastric vein. Pressure measurements were obtained: Right atrium mean 16 mmHg, portal vein 20 mmHg mean, portosystemic gradient 4 mm Hg final. All wires, catheters and sheaths were removed from the patient. Hemostasis was achieved at  the right IJ access site with manual compression. A dressing was placed. Dr. Markus Daft assisted with the latter portion of the procedure. The patient tolerated the procedure well. Patient transferred to the PACU. IMPRESSION: 1. Technically successful TIPS creation with decrease in mean portosystemic pressure from 15 to 4 mmHg. 2. Technically successful coil embolization of esophageal  varices. 3. Ultrasound guided paracentesis removing 1.5 L clear yellow ascites. Electronically Signed   By: Lucrezia Europe M.D.   On: 04/09/2019 08:47   US Abdomen Limited Ruq  Result Date: 03/18/2019 CLINICAL DATA:  Cirrhosis EXAM: ULTRASOUND ABDOMEN LIMITED RIGHT UPPER QUADRANT COMPARISON:  2019 FINDINGS: Gallbladder: Calculi measuring up to 9 mm. Mild wall thickening is again noted. No sonographic Murphy sign noted by sonographer. Common bile duct: Diameter: 4 mm Liver: No focal lesion identified. Heterogeneous echotexture likely related to known cirrhosis. Portal vein is patent on color Doppler imaging with normal direction of blood flow towards the liver. Other: Small volume perihepatic ascites. IMPRESSION: No hepatic lesion identified. Small volume perihepatic ascites. Cholelithiasis. Electronically Signed   By: Macy Mis M.D.   On: 03/18/2019 09:14   Dg Esophagus W Single Cm (sol Or Thin Ba)  Result Date: 03/18/2019 CLINICAL DATA:  Food and pills get stuck during swallowing. History of cirrhosis and distal paraesophageal varices. Most recent endoscopy 07/07/2016 EXAM: ESOPHOGRAM / BARIUM SWALLOW / BARIUM TABLET STUDY TECHNIQUE: Combined double contrast and single contrast examination performed using effervescent crystals, thick barium liquid, and thin barium liquid. The patient was observed with fluoroscopy swallowing a 13 mm barium sulphate tablet. FLUOROSCOPY TIME:  Fluoroscopy Time:  2.0 minutes Radiation Exposure Index (if provided by the fluoroscopic device): 11.8 mGy Number of Acquired Spot Images: 0 COMPARISON:  Report from 03/19/1999 FINDINGS: During the pharyngeal phase of swallowing there was laryngeal penetration to the level of the cords (image 1, series 2) Primary peristaltic waves in the esophagus were intact on 4/4 swallows. Large transient filling defects in the distal third of the esophagus are compatible with soft gel varices. Fold thickening in the distal esophagus suggesting  esophagitis. Reduced sensitivity for small sirs given the degree of para soft gel varices and resulting impressions/irregularity, but I do not see definite ulceration. No compelling mind findings of mass separate from the mass effect from the varices. A 13 mm barium tab tablet initially impacted in the distal esophagus but with additional swallows of contrast medium passed into the stomach. I do not see a discrete stricture. IMPRESSION: 1. Prominent paraesophageal varices in the distal third of the esophagus. 2. Fold thickening in the distal esophagus suggesting esophagitis. I do not see definite ulceration or mass, although negative predictive value is adversely affected by the varices. 3. No significant dysmotility. 4. Single episode of laryngeal penetration to the level of the cords during the pharyngeal phase of swallowing. Electronically Signed   By: Van Clines M.D.   On: 03/18/2019 10:05   Ir Paracentesis  Result Date: 04/09/2019 CLINICAL DATA:  Cirrhosis, portal venous hypertension, large esophageal varices, recurrent bleeding despite aggressive endoscopic treatment. EXAM: 1. TIPS CREATION 2. ULTRASOUND-GUIDED VENOUS ACCESS 3. PARACENTESIS WITH ULTRASOUND GUIDANCE ANESTHESIA/SEDATION: General - as administered by the Anesthesia department MEDICATIONS: No periprocedural antibiotics were indicated FLUOROSCOPY TIME:  67 min , 24 sec; 419 mGy COMPLICATIONS: None immediate. PROCEDURE: Informed written consent was obtained from the patient after a thorough discussion of the procedural risks, benefits and alternatives. All questions were addressed. See previous consultation. Maximal Sterile Barrier Technique was utilized including caps,  mask, sterile gowns, sterile gloves, sterile drape, hand hygiene and skin antiseptic. A timeout was performed prior to the initiation of the procedure. Patient was placed under general anesthetic by department of anesthesia personnel. The skin overlying the right upper  abdominal quadrant as well as the right neck and femoral region were prepped and draped in usual sterile fashion. Initial ultrasound scanning demonstrates a moderate amount of recurrent perihepatic ascites. Under ultrasound guidance, a 6 [place] Safe-T-Centesis needle was advanced into the peritoneal cavity from a right lateral approach for paracentesis removing 1.5 L clear yellow ascites. Under direct ultrasound guidance, a peripheral aspect of the right portal vein was accessed with a 21 gauge micropuncture needle. Ultrasound image was saved for procedural documentation purposes. Guidewire advanced into the main portal vein. Micropuncture transitional dilator was advanced over the guidewire, with the radiopaque transition positioned at the target entrance to the peripheral right portal vein. Next, the right internal jugular vein was accessed under direct ultrasound. Ultrasound image was saved for procedural documentation purposes. This allowed for placement of the 10 French TIPS vascular sheath. With the aid of angiographic guidewires, an MPA catheter was utilized to select the right hepatic vein and a hepatic venogram was performed.  Sheath advanced into the right hepatic vei for n. Under fluoroscopic guidance, the right portal vein with targeted with a Rsch-Uchida TIPS needle directed at the radiopaque target within the right portal vein. Ultimately, the right portal vein was accessed at a desirable location allowing advancement of a stiff Glidewire into the main portal vein. A 4 French glide catheter was advanced over the stiff Glidewire and a portal venogram was performed. Pressure measurements were obtained: Right atrium mean 21 mmHg, portal vein 36 mmHg; portosystemic gradient 15 mmHg preprocedure. Next, as there was difficulty advancing a measuring catheter through the intrahepatic track, the track was dilated with a 45m Mustang balloon. Measurements were taken for pre deployment planning. Subsequently a 7  mm Conquest balloon was used through the tract using a stiff Amplatz guidewire, allowing advancement of the sheath into the main portal vein. A portal venogram was performed. Tract further dilated with the 7 mm Conquest balloon to facilitate advancement of the sheath into the main portal vein. The percutaneous portal microcatheter and wire were removed. Next, a 2 cm (un covered) x 8 cm (covered) x 10 mm diameter GORE VIATORR TIPS Endoprosthesis was advanced through the sheath across the intra hepatic track and deployed. The TIPS stent was angioplastied in multiple stations to 10 mm diameter. Follow-up portal venogram demonstrated good stent deployment, with little antegrade flow through the portal system and stent, primary outflow via the dilated branch 2 esophageal varices. Some intraluminal thrombus was evident. The branch to the esophageal varices was selectively catheterized with a 5 [place] catheter. The vein was embolized with 8 and 6 mm interlock 035 coils. The 10 mm mustang balloon was then used to macerate the thrombus in the central aspect of the splenic vein through the main portal vein and TIPS stent. Subsequent portal venography demonstrates occlusion of the vein to the esophageal varices. Some residual mural thrombus in the central splenic vein and portosplenic confluence , good flow through the TIPS stent with no stenosis or thrombus. Prolonged dilatation of the stenosis in the central splenic vein and portal splenic confluence was performed with the 10 mm balloon. Subsequent venography demonstrates continued good flow through the splenic vein and portosplenic confluence, wide patency of the TIPS stent, no significant stenosis. Mild residual clot in  the central splenic vein. Continued opacification of posterior gastric vein. Pressure measurements were obtained: Right atrium mean 16 mmHg, portal vein 20 mmHg mean, portosystemic gradient 4 mm Hg final. All wires, catheters and sheaths were removed  from the patient. Hemostasis was achieved at the right IJ access site with manual compression. A dressing was placed. Dr. Markus Daft assisted with the latter portion of the procedure. The patient tolerated the procedure well. Patient transferred to the PACU. IMPRESSION: 1. Technically successful TIPS creation with decrease in mean portosystemic pressure from 15 to 4 mmHg. 2. Technically successful coil embolization of esophageal varices. 3. Ultrasound guided paracentesis removing 1.5 L clear yellow ascites. Electronically Signed   By: Lucrezia Europe M.D.   On: 04/09/2019 08:47      TIPS  Coil embolization of esophageal varices  Ultrasound-guided paracentesis with removal of 1.5 L of fluid  EGD:- Multiple esophageal ulcers-one was actively  bleeding in the distal; injected with epinephrine  and 2 clips (MR unsafe) were placed. - Esophageal varix at 35 cm-ulcer with stigmata of  recent bleeding-banded x 1.. - Portal hypertensive gastropathy. - Normal duodenal bulb. - No specimens collected.  Subjective: Patient seen and examined at bedside.  She feels much better and thinks that she is very cooperative.  No episodes of bleeding, black or bloody stools.  No overnight fever or vomiting.  Tolerating diet well.  Discharge Exam: Vitals:   04/12/19 0500 04/12/19 0828  BP: 120/62 125/61  Pulse: 64   Resp: 18 18  Temp: 98 F (36.7 C)   SpO2: 97%     General: Pt is alert, awake, not in acute distress.  Very thinly built female lying in bed. Cardiovascular: rate controlled, S1/S2 + Respiratory: bilateral decreased breath sounds at bases Abdominal: Soft, NT, ND, bowel sounds + Extremities: no edema, no cyanosis    The results of significant diagnostics from this hospitalization (including imaging, microbiology,  ancillary and laboratory) are listed below for reference.     Microbiology: Recent Results (from the past 240 hour(s))  SARS CORONAVIRUS 2 (TAT 6-24 HRS) Nasopharyngeal Nasopharyngeal Swab     Status: None   Collection Time: 04/06/19  8:09 PM   Specimen: Nasopharyngeal Swab  Result Value Ref Range Status   SARS Coronavirus 2 NEGATIVE NEGATIVE Final    Comment: (NOTE) SARS-CoV-2 target nucleic acids are NOT DETECTED. The SARS-CoV-2 RNA is generally detectable in upper and lower respiratory specimens during the acute phase of infection. Negative results do not preclude SARS-CoV-2 infection, do not rule out co-infections with other pathogens, and should not be used as the sole basis for treatment or other patient management decisions. Negative results must be combined with clinical observations, patient history, and epidemiological information. The expected result is Negative. Fact Sheet for Patients: SugarRoll.be Fact Sheet for Healthcare Providers: https://www.woods-mathews.com/ This test is not yet approved or cleared by the [place] FDA and  has been authorized for detection and/or diagnosis of SARS-CoV-2 by FDA under an Emergency Use Authorization (EUA). This EUA will remain  in effect (meaning this test can be used) for the duration of the COVID-19 declaration under Section 56 4(b)(1) of the Act, 21 U.S.C. section 360bbb-3(b)(1), unless the authorization is terminated or revoked sooner. Performed at [facility], [place] [address], [place], [place] [address]   MRSA PCR Screening     Status: None   Collection Time: 04/07/19  1:42 PM   Specimen: Nasal Mucosa; Nasopharyngeal  Result Value Ref Range Status  MRSA by PCR NEGATIVE NEGATIVE Final    Comment:        The GeneXpert MRSA Assay (FDA approved for NASAL specimens only), is one component of a comprehensive MRSA colonization surveillance program. It is not intended to  diagnose MRSA infection nor to guide or monitor treatment for MRSA infections. Performed at [facility], [place] [address], [place], [place] [address]      Labs: BNP (last 3 results) No results for input(s): BNP in the last 8760 hours. Basic Metabolic Panel: Recent Labs  Lab 04/09/19 1530 04/09/19 1531 04/10/19 0028 04/10/19 0641 04/11/19 0425 04/12/19 0303  NA 137 138 136 136 139 142  K 4.7 4.7 4.1 3.9 3.6 3.3*  CL 110  --  111 110 113* 113*  CO2 13*  --  16* 17* 18* 19*  GLUCOSE 309*  --  229* 135* 198* 84  BUN 27*  --  34* 34* 31* 20  CREATININE 1.75*  --  2.02* 2.09* 1.44* 0.90  CALCIUM 8.3*  --  8.1* 8.0* 8.3* 8.6*  MG  --   --   --   --  1.8 1.6*   Liver Function Tests: Recent Labs  Lab 04/06/19 1300 04/07/19 0350  AST 46* 36  ALT 34 29  ALKPHOS 95 71  BILITOT 1.4* 1.3*  PROT 7.3 6.0*  ALBUMIN 2.9* 2.4*   Recent Labs  Lab 04/06/19 1300  LIPASE 23   No results for input(s): AMMONIA in the last 168 hours. CBC: Recent Labs  Lab 04/08/19 2342 04/09/19 0316 04/09/19 1531 04/10/19 0835 04/11/19 0425 04/12/19 0303  WBC 19.9* 20.4*  --  11.9* 7.9 8.3  HGB 14.3 14.4 12.6 12.0 11.9* 12.7  HCT 46.8* 46.3* 37.0 35.4* 35.4* 37.3  MCV 102.4* 102.0*  --  93.4 93.2 92.3  PLT 138* 134*  --  64* 54* 56*   Cardiac Enzymes: No results for input(s): CKTOTAL, CKMB, CKMBINDEX, TROPONINI in the last 168 hours. BNP: Invalid input(s): POCBNP CBG: Recent Labs  Lab 04/11/19 0819 04/11/19 1156 04/11/19 1649 04/11/19 2136 04/12/19 0606  GLUCAP 182* 197* 173* 83 73   D-Dimer No results for input(s): DDIMER in the last 72 hours. Hgb A1c No results for input(s): HGBA1C in the last 72 hours. Lipid Profile No results for input(s): CHOL, HDL, LDLCALC, TRIG, CHOLHDL, LDLDIRECT in the last 72 hours. Thyroid function studies No results for input(s): TSH, T4TOTAL, T3FREE, THYROIDAB in the last 72 hours.  Invalid input(s): FREET3 Anemia work up No results for  input(s): VITAMINB12, FOLATE, FERRITIN, TIBC, IRON, RETICCTPCT in the last 72 hours. Urinalysis    Component Value Date/Time   COLORURINE YELLOW 06/19/2017 1228   APPEARANCEUR CLEAR 06/19/2017 1228   LABSPEC 1.007 06/19/2017 1228   PHURINE 7.0 06/19/2017 1228   GLUCOSEU NEGATIVE 06/19/2017 1228   HGBUR TRACE (A) 06/19/2017 [place] 01/27/2016 2016   BILIRUBINUR neg 05/05/2012 [place] 06/19/2017 1228   PROTEINUR NEGATIVE 06/19/2017 1228   UROBILINOGEN 0.2 05/05/2012 1445   UROBILINOGEN 0.2 12/18/2011 1409   NITRITE NEGATIVE 06/19/2017 1228   LEUKOCYTESUR 3+ (A) 06/19/2017 1228   Sepsis Labs Invalid input(s): PROCALCITONIN,  WBC,  LACTICIDVEN Microbiology Recent Results (from the past 240 hour(s))  SARS CORONAVIRUS 2 (TAT 6-24 HRS) Nasopharyngeal Nasopharyngeal Swab     Status: None   Collection Time: 04/06/19  8:09 PM   Specimen: Nasopharyngeal Swab  Result Value Ref Range Status   SARS Coronavirus 2 NEGATIVE NEGATIVE  Final    Comment: (NOTE) SARS-CoV-2 target nucleic acids are NOT DETECTED. The SARS-CoV-2 RNA is generally detectable in upper and lower respiratory specimens during the acute phase of infection. Negative results do not preclude SARS-CoV-2 infection, do not rule out co-infections with other pathogens, and should not be used as the sole basis for treatment or other patient management decisions. Negative results must be combined with clinical observations, patient history, and epidemiological information. The expected result is Negative. Fact Sheet for Patients: SugarRoll.be Fact Sheet for Healthcare Providers: https://www.woods-mathews.com/ This test is not yet approved or cleared by the [place] FDA and  has been authorized for detection and/or diagnosis of SARS-CoV-2 by FDA under an Emergency Use Authorization (EUA). This EUA will remain  in effect (meaning this test can be  used) for the duration of the COVID-19 declaration under Section 56 4(b)(1) of the Act, 21 U.S.C. section 360bbb-3(b)(1), unless the authorization is terminated or revoked sooner. Performed at [facility], [place] [address], [place], [place] [address]   MRSA PCR Screening     Status: None   Collection Time: 04/07/19  1:42 PM   Specimen: Nasal Mucosa; Nasopharyngeal  Result Value Ref Range Status   MRSA by PCR NEGATIVE NEGATIVE Final    Comment:        The GeneXpert MRSA Assay (FDA approved for NASAL specimens only), is one component of a comprehensive MRSA colonization surveillance program. It is not intended to diagnose MRSA infection nor to guide or monitor treatment for MRSA infections. Performed at [facility], [place] [address] [place], [place] [address]      Time coordinating discharge: 35 minutes  SIGNED:   Aline August, MD  [facility] 04/12/2019, 9:53 AM

## 2019-04-12 NOTE — TOC Transition Note (Signed)
Transition of Care [facility]) - CM/SW Discharge Note Marvetta Gibbons RN, BSN Transitions of Care Unit 4E- RN Case Manager [phone]   Patient Details  Name: [name] MRN: [id number] Date of Birth: 04-Oct-1945  Transition of Care [facility]) CM/SW Contact:  Dawayne Patricia, RN Phone Number: 04/12/2019, 1:24 PM   Clinical Narrative:    Pt stable for transition home today, Per PT eval recommendation for HHPT- order has been placed. CM spoke with pt at the bedside- list provided Per CMS guidelines from medicare.gov website with star ratings (copy placed in shadow chart) for [facility] choice- per pt she has selected [facility] for [place] needs- pt reports she has DME at home- no other needs- husband at bedside to provide transport home. Address, phone # and PCP all confirmed with pt in epic. Call made to [facility] with [facility] for [facility] referral- referral has been accepted. [facility] will also f/u with pt.    Final next level of care: Home w Home Health Services Barriers to Discharge: No Barriers Identified   Patient Goals and CMS Choice Patient states their goals for this hospitalization and ongoing recovery are:: to get back to my old self before being sick CMS Medicare.gov Compare Post Acute Care list provided to:: Patient Choice offered to / list presented to : Patient  Discharge Placement                 Home with [facility]      Discharge Plan and Services   Discharge Planning Services: CM Consult Post Acute Care Choice: Home Health          DME Arranged: N/A DME Agency: NA       HH Arranged: PT [place] Agency: [facility] ([facility]) Date HH Agency Contacted: 04/12/19 Time [place]: [place] Representative spoke with at [place]: [place] ([place]) Interventions     Readmission Risk Interventions Readmission Risk Prevention Plan 04/12/2019  Transportation Screening Complete  PCP or Specialist Appt within 3-5 Days Complete  HRI or [place]  Complete  Social Work Consult for [place] Planning/Counseling Complete  Palliative Care Screening Not Applicable  Medication Review Press photographer) Complete  Some recent data might be hidden

## 2019-04-12 NOTE — Progress Notes (Signed)
Inpatient Diabetes Program Recommendations  AACE/ADA: New Consensus Statement on Inpatient Glycemic Control   Target Ranges:  Prepandial:   less than 140 mg/dL      Peak postprandial:   less than 180 mg/dL (1-2 hours)      Critically ill patients:  140 - 180 mg/dL   Results for BABYGIRL, TRAGER (MRN [id number]) as of 04/12/2019 13:17  Ref. Range 04/11/2019 08:19 04/11/2019 11:56 04/11/2019 16:49 04/11/2019 21:36 04/12/2019 06:06 04/12/2019 11:26  Glucose-Capillary Latest Ref Range: 70 - 99 mg/dL 182 (H) 197 (H) 173 (H) 83 73 192 (H)   Review of Glycemic Control  Diabetes history: DM2 Outpatient Diabetes medications: Jardiance 25 mg daily, Levemir 25 units daily, Novolog 12 units with breakfast, Novolog 8 units with lunch, Novolog 12 units with supper Current orders for Inpatient glycemic control: Levemir 20 units daily, Novolog 0-9 units TID with meals, Novolog 0-5 units QHS  Inpatient Diabetes Program Recommendations:   Insulin - Basal: Fasting glucose 73 mg/dl today and noted Levemir was NOT GIVEN this morning. Please consider decreasing Levemir to 15 units daily to start now so patient can receive Levemir prior to discharge.  NOTE: Noted patient will be discharged today. Fasting glucose 73 mg/dl and noted Levemir was NOT GIVEN this morning. Sent chat to F. Paseda, RN and she reports patient is still here and she confirms Levemir was not given and patient did not eat much breakfast today. Anticipate patient will need Levemir insulin today prior to discharge to prevent DKA/hyperglycemia. Sent secure chat to Dr. Shanda Howells (included RN in conversation) to request Levemir be decreased to 15 units daily and to start now so patient can receive dose prior to discharge.   Thanks, Barnie Alderman, RN, MSN, CDE Diabetes Coordinator Inpatient Diabetes Program [phone] (Team Pager from 8am to 5pm)

## 2019-04-12 NOTE — Progress Notes (Signed)
Patient in a stable condition, discharged education reviewed with patient and her husband they verbalized understanding, iv removed tele dc ccmd notified, patient belongings at bedside, patient to be transported home by her husband

## 2019-04-13 ENCOUNTER — Telehealth: Payer: Self-pay | Admitting: Internal Medicine

## 2019-04-13 DIAGNOSIS — K219 Gastro-esophageal reflux disease without esophagitis: Secondary | ICD-10-CM | POA: Diagnosis not present

## 2019-04-13 DIAGNOSIS — I130 Hypertensive heart and chronic kidney disease with heart failure and stage 1 through stage 4 chronic kidney disease, or unspecified chronic kidney disease: Secondary | ICD-10-CM

## 2019-04-13 DIAGNOSIS — N181 Chronic kidney disease, stage 1: Secondary | ICD-10-CM | POA: Diagnosis not present

## 2019-04-13 DIAGNOSIS — D696 Thrombocytopenia, unspecified: Secondary | ICD-10-CM | POA: Diagnosis not present

## 2019-04-13 DIAGNOSIS — I81 Portal vein thrombosis: Secondary | ICD-10-CM | POA: Diagnosis not present

## 2019-04-13 DIAGNOSIS — K3189 Other diseases of stomach and duodenum: Secondary | ICD-10-CM | POA: Diagnosis not present

## 2019-04-13 DIAGNOSIS — E1122 Type 2 diabetes mellitus with diabetic chronic kidney disease: Secondary | ICD-10-CM | POA: Diagnosis not present

## 2019-04-13 DIAGNOSIS — E1165 Type 2 diabetes mellitus with hyperglycemia: Secondary | ICD-10-CM | POA: Diagnosis not present

## 2019-04-13 DIAGNOSIS — M1990 Unspecified osteoarthritis, unspecified site: Secondary | ICD-10-CM | POA: Diagnosis not present

## 2019-04-13 DIAGNOSIS — Z951 Presence of aortocoronary bypass graft: Secondary | ICD-10-CM | POA: Diagnosis not present

## 2019-04-13 DIAGNOSIS — Z794 Long term (current) use of insulin: Secondary | ICD-10-CM | POA: Diagnosis not present

## 2019-04-13 DIAGNOSIS — I509 Heart failure, unspecified: Secondary | ICD-10-CM

## 2019-04-13 DIAGNOSIS — K7581 Nonalcoholic steatohepatitis (NASH): Secondary | ICD-10-CM | POA: Diagnosis not present

## 2019-04-13 DIAGNOSIS — E1136 Type 2 diabetes mellitus with diabetic cataract: Secondary | ICD-10-CM | POA: Diagnosis not present

## 2019-04-13 DIAGNOSIS — I2510 Atherosclerotic heart disease of native coronary artery without angina pectoris: Secondary | ICD-10-CM | POA: Diagnosis not present

## 2019-04-13 DIAGNOSIS — I8501 Esophageal varices with bleeding: Secondary | ICD-10-CM | POA: Diagnosis not present

## 2019-04-13 DIAGNOSIS — R1310 Dysphagia, unspecified: Secondary | ICD-10-CM | POA: Diagnosis not present

## 2019-04-13 DIAGNOSIS — K7469 Other cirrhosis of liver: Secondary | ICD-10-CM | POA: Diagnosis not present

## 2019-04-13 DIAGNOSIS — E785 Hyperlipidemia, unspecified: Secondary | ICD-10-CM | POA: Diagnosis not present

## 2019-04-13 MED ORDER — SPIRONOLACTONE 25 MG PO TABS: 25.0000 mg | ORAL_TABLET | Freq: Two times a day (BID) | ORAL | 0 refills | Status: DC

## 2019-04-13 NOTE — Telephone Encounter (Signed)
Cecilie Lowers, PT from [place], called me on Sat around 10:30am noting he was out to see Mrs. Broerman to start her PT.  Her legs had 3+ pitting edema to the knees and she'd reported being unable to sleep due to shortness of breath when lying down.  Her diuretics were stopped during her hospitalization and he wondered if one should be resumed due to these changes.  Mrs. Gibbon did NOT want to return to the hospital b/c she was just released and had been there two other times just since 11/19.  She will be seeing Dinah for TOC on 04/19/19.  Upon reviewing d/c summary, indeed lasix and spironolactone were on hold.  lantus was reduced and jardiance stopped (also has diuretic effect).   She was hospitalized first 11/19 for an initial EGD with banding of varices with Dr. [name].  She was then hospitalized  from 11/23-11/25 with dysphagia and odynophagia felt to be related to her varices.   It was during that stay that the diuretics were held b/c of her hypotension and she was given fluids.  They were to be resumed that time at discharge 11/25. She was meant to have her varices banded outpatient, but could not eat so she'd returned to the hospital on 11/28.  Her last stay was for bleeding varices from 11/28-12/4. She underwent EGD with 14 bands placed during the procedure.  She is again to f/u with GI and myself for her repeat labs.  BP was 121 over something.  She is not dizzy.  She feels weak and is sob just sitting.  I could hear her shortness of breath through the phone.  We opted to resume just her spironolactone 40m po bid and continue to hold the lasix.  She will see me at 10am Monday.  I don't want her to wait until 12/11 to be seen.

## 2019-04-15 ENCOUNTER — Encounter: Payer: Self-pay | Admitting: Internal Medicine

## 2019-04-15 ENCOUNTER — Telehealth: Payer: Self-pay | Admitting: Gastroenterology

## 2019-04-15 ENCOUNTER — Other Ambulatory Visit: Payer: Self-pay

## 2019-04-15 ENCOUNTER — Ambulatory Visit (INDEPENDENT_AMBULATORY_CARE_PROVIDER_SITE_OTHER): Payer: Medicare Other | Admitting: Internal Medicine

## 2019-04-15 ENCOUNTER — Other Ambulatory Visit: Payer: Self-pay | Admitting: *Deleted

## 2019-04-15 ENCOUNTER — Telehealth: Payer: Self-pay | Admitting: Internal Medicine

## 2019-04-15 VITALS — BP 128/64 | HR 60 | Temp 96.0°F | Ht 63.0 in | Wt 169.0 lb

## 2019-04-15 DIAGNOSIS — K7460 Unspecified cirrhosis of liver: Secondary | ICD-10-CM | POA: Diagnosis not present

## 2019-04-15 DIAGNOSIS — E1165 Type 2 diabetes mellitus with hyperglycemia: Secondary | ICD-10-CM | POA: Diagnosis not present

## 2019-04-15 DIAGNOSIS — I8501 Esophageal varices with bleeding: Secondary | ICD-10-CM

## 2019-04-15 DIAGNOSIS — I81 Portal vein thrombosis: Secondary | ICD-10-CM

## 2019-04-15 DIAGNOSIS — K7469 Other cirrhosis of liver: Secondary | ICD-10-CM | POA: Diagnosis not present

## 2019-04-15 DIAGNOSIS — K7581 Nonalcoholic steatohepatitis (NASH): Secondary | ICD-10-CM | POA: Diagnosis not present

## 2019-04-15 DIAGNOSIS — R188 Other ascites: Secondary | ICD-10-CM

## 2019-04-15 DIAGNOSIS — I2510 Atherosclerotic heart disease of native coronary artery without angina pectoris: Secondary | ICD-10-CM | POA: Diagnosis not present

## 2019-04-15 DIAGNOSIS — I25118 Atherosclerotic heart disease of native coronary artery with other forms of angina pectoris: Secondary | ICD-10-CM

## 2019-04-15 DIAGNOSIS — K3189 Other diseases of stomach and duodenum: Secondary | ICD-10-CM | POA: Diagnosis not present

## 2019-04-15 MED ORDER — SPIRONOLACTONE 50 MG PO TABS: 50.0000 mg | ORAL_TABLET | Freq: Two times a day (BID) | ORAL | Status: DC

## 2019-04-15 MED ORDER — FUROSEMIDE 20 MG PO TABS: 20.0000 mg | ORAL_TABLET | Freq: Every day | ORAL | 3 refills | Status: DC

## 2019-04-15 NOTE — Telephone Encounter (Signed)
Please call [name] and let her know that I spoke with Dr. [name].  He recommends we try to increase her spironolactone up to 59m po bid and add back the lasix 266mdaily.  If she struggles with dizziness or lightheadedness, she should let usKorear him know.  These are half her prior doses.  He has asked them to get her in to be seen on Wednesday morning at his office.  He will review the labs we did today and see how she's doing at that point.

## 2019-04-15 NOTE — Telephone Encounter (Signed)
See alternate note

## 2019-04-15 NOTE — Patient Instructions (Signed)
Do not take today's potassium. We will let you know if you need to restart potassium. I will call you when I get to speak with Dr. [name] and let you know next steps and when he can see you.  Continue the spironolactone 84m twice daily for now.  Do your best to get enough water and pureed food into your system.  The shakes are a good idea.

## 2019-04-15 NOTE — Telephone Encounter (Signed)
Dr [name] can you call Dr Mariea Clonts at [phone]

## 2019-04-15 NOTE — Progress Notes (Signed)
Location:   [facility]  Place of Service:   clinic Provider: Madilynne Mullan L. Mariea Clonts, D.O., C.M.D.  Code Status: full code Goals of Care:  Advanced Directives 04/15/2019  Does Patient Have a Medical Advance Directive? No  Does patient want to make changes to medical advance directive? -  Would patient like information on creating a medical advance directive? -     Chief Complaint  Patient presents with  . Transitions Of Care    Patient discharged from the hospital for cirrhosis of the liver Accompanied by husband.  . Quality Metric Gaps    Need A1c, ophthalmologic exam     HPI: Patient is a 73 y.o. female seen today for hospital follow-up s/p admission from 11/28-12/4/20.    First hospitalization 03/28/19--had EGD with Dr. [name] for dysphagia--had varices banded.    Still unable eat and drink adequately--got dehydrated.  In hospital from 11/23-11/25.  She was hypotensive so her spironolactone and lasix were stopped.  They were restarted at d/c.   Her insulin was also reduced.     Then she was hospitalizaed again 11/28-12/4--had TIPS 11/30 due to portal vein thrombosis and coil embolization of varices 11/30.  Jardiance was stopped d/t metabolic acidosis.  She also had 1.5 L of fluid removed by paracentesis on 11/30 all with IR.  BP today reasonable at 128/64.  On Saturday, I'd gotten a call from PT at her home who recommended she be hospitalized or we resume her diuretics for the weight gain, orthopnea and sob on the phone with me.  We started her back on her spironolactone 81m po bid but not her lasix.  She is not dizzy.    She remains up 22 lbs from hospital d/c 11/25 (not sure what she was before I resumed the diuretic sat).  She's been sleeping on the couch or recliner due to dyspnea.  She can take deeper breaths that way.  She drank an electrolyte drink also that she feels helped.  She is urinating every two hours.  It's dark in color.  No abdominal pain now.  Says carafate and lidocaine  helped with the tightness around her abdomen.    She called to make a GI appt and they were scheduling her in January.  They are to call her back.  She was told she was to f/u within the week.    Her intake remains poor--ate 1/2 plate of eggs yesterday, yogurt and a small amount of what sounded like a pureed casserole from a church friend.  This morning she had just yogurt mixed into boost.  I spoke with Dr. JArdis Hughsand he advised we increase the diuretics:  Spironolactone back up to 554mpo bid and resume lasix at 2083maily.  He will see her Wednesday for evaluation and will look at today's labs at that point.    Past Medical History:  Diagnosis Date  . Allergy   . Arthritis    neck  . Cataract    bilateral - MD monitoring cataracts  . CHF (congestive heart failure) [place] . Chronic kidney disease, stage I    DR OTTELIN  HX UTIS  . Cirrhosis ([facility] . Cramp of limb   . Diabetes mellitus   . Dysphagia, unspecified(787.20)   . Dysuria   . Epistaxis   . GERD (gastroesophageal reflux disease)   . Heart murmur    NO CARDIOLOGIST  DX FOR YEARS ASYMPTOMATIC  . Lumbago   . Neoplasm of uncertain behavior  of skin   . Nonspecific elevation of levels of transaminase or lactic acid dehydrogenase (LDH)   . Osteoarthrosis, unspecified whether generalized or localized, unspecified site   . Other and unspecified hyperlipidemia    diet controlled  . Pain in joint, shoulder region   . Paresthesias 04/01/2015  . Postablative ovarian failure   . Trochanteric bursitis of left hip 12/15/2015  . Type 2 diabetes mellitus without complication ([place])   . Unspecified essential hypertension    no meds    Past Surgical History:  Procedure Laterality Date  . BREAST BIOPSY    . CARDIAC CATHETERIZATION N/A 01/27/2016   Procedure: Left Heart Cath and Coronary Angiography;  Surgeon: Belva Crome, MD;  Location: [facility] LAB;  Service: Cardiovascular;  Laterality: N/A;  . COLONOSCOPY  2012   [facility]  - polyp  . COLONOSCOPY WITH PROPOFOL N/A 07/07/2016   Procedure: COLONOSCOPY WITH PROPOFOL;  Surgeon: [name], MD;  Location: [facility];  Service: Endoscopy;  Laterality: N/A;  . CORONARY ARTERY BYPASS GRAFT N/A 01/28/2016   Procedure: CORONARY ARTERY BYPASS GRAFTING (CABG) x 3 USING RIGHT LEG GREATER SAPHENOUS VEIN GRAFT;  Surgeon: Melrose Nakayama, MD;  Location: [facility];  Service: Open Heart Surgery;  Laterality: N/A;  . ENDOVEIN HARVEST OF GREATER SAPHENOUS VEIN Right 01/28/2016   Procedure: ENDOVEIN HARVEST OF GREATER SAPHENOUS VEIN;  Surgeon: Melrose Nakayama, MD;  Location: [facility];  Service: Open Heart Surgery;  Laterality: Right;  . ESOPHAGEAL BANDING  03/28/2019   Procedure: ESOPHAGEAL BANDING;  Surgeon: [name], MD;  Location: [facility];  Service: Endoscopy;;  . ESOPHAGEAL BANDING  04/07/2019   Procedure: ESOPHAGEAL BANDING;  Surgeon: Juanita Craver, MD;  Location[facility] ENDOSCOPY;  Service: Endoscopy;;  . ESOPHAGOGASTRODUODENOSCOPY N/A 04/07/2019   Procedure: ESOPHAGOGASTRODUODENOSCOPY (EGD);  Surgeon: Juanita Craver, MD;  Location: [facility] ENDOSCOPY;  Service: Endoscopy;  Laterality: N/A;  . ESOPHAGOGASTRODUODENOSCOPY (EGD) WITH PROPOFOL N/A 07/07/2016   Procedure: ESOPHAGOGASTRODUODENOSCOPY (EGD) WITH PROPOFOL;  Surgeon: [name], MD;  [facility];  Service: Endoscopy;  Laterality: N/A;  . ESOPHAGOGASTRODUODENOSCOPY (EGD) WITH PROPOFOL N/A 03/28/2019   Procedure: ESOPHAGOGASTRODUODENOSCOPY (EGD) WITH PROPOFOL;  Surgeon: [name], MD;  [facility];  Service: Endoscopy;  Laterality: N/A;  . HEMOSTASIS CLIP PLACEMENT  04/07/2019   Procedure: HEMOSTASIS CLIP PLACEMENT;  Surgeon: Juanita Craver, MD;  Location: [facility] ENDOSCOPY;  Service: Endoscopy;;  . IR ANGIOGRAM SELECTIVE EACH ADDITIONAL VESSEL  04/08/2019  . IR EMBO ART  VEN HEMORR LYMPH EXTRAV  INC GUIDE ROADMAPPING  04/08/2019  . IR PARACENTESIS  04/08/2019  . IR TIPS  04/08/2019  .  MAXIMUM ACCESS (MAS)POSTERIOR LUMBAR INTERBODY FUSION (PLIF) 1 LEVEL Left 06/10/2015   Procedure: FOR MAXIMUM ACCESS (MAS) POSTERIOR LUMBAR INTERBODY FUSION (PLIF) LUMBAR THREE-FOUR EXTRAFORAMINAL MICRODISCECTOMY LUMBAR FIVE-SACRAL ONE LEFT;  Surgeon: Eustace Moore, MD;  Location: [facility] NEURO ORS;  Service: Neurosurgery;  Laterality: Left;  . RADIOLOGY WITH ANESTHESIA N/A 04/08/2019   Procedure: RADIOLOGY WITH ANESTHESIA;  Surgeon: Radiologist, Medication, MD;  Location: [facility];  Service: Radiology;  Laterality: N/A;  . SCLEROTHERAPY  04/07/2019   Procedure: SCLEROTHERAPY;  Surgeon: Juanita Craver, MD;  Location[facility] ENDOSCOPY;  Service: Endoscopy;;  . TEE WITHOUT CARDIOVERSION N/A 01/28/2016   Procedure: TRANSESOPHAGEAL ECHOCARDIOGRAM (TEE);  Surgeon: Melrose Nakayama, MD;  Location: [facility];  Service: Open Heart Surgery;  Laterality: N/A;  . TUBAL LIGATION  1982   Dr Connye Burkitt  . UPPER GASTROINTESTINAL ENDOSCOPY    .  VAGINAL HYSTERECTOMY  1997   Dr Rande Lawman    Allergies  Allergen Reactions  . Kiwi Extract Anaphylaxis  . Tdap [Tetanus-Diphth-Acell Pertussis] Swelling and Other (See Comments)    Swelling at injection site, gets very hot  . Statins     RHABDOMYOLYSIS  . Latex Itching, Dermatitis and Rash  . Tramadol Nausea And Vomiting    Outpatient Encounter Medications as of 04/15/2019  Medication Sig  . acetaminophen (TYLENOL) 500 MG tablet Take 500 mg by mouth at bedtime.   . Aromatic Inhalants (VICKS VAPOR IN) Vicks Vapor Rub apply small amount to outside of nose to help breathing  . BD PEN NEEDLE NANO U/F 32G X 4 MM MISC USE THREE TIMES DAILY AS DIRECTED  . Biotin 10000 MCG TABS Take 10,000 mcg by mouth every morning.  . bisacodyl (DULCOLAX) 10 MG suppository Place 1 suppository (10 mg total) rectally daily as needed for moderate constipation.  . calcium carbonate (OS-CAL) 600 MG TABS Take 600 mg by mouth 2 (two) times daily with a meal.    . Cholecalciferol (VITAMIN D) 50 MCG (2000 UT) CAPS  Take 2,000 Units by mouth daily.   . Cyanocobalamin (VITAMIN B 12 PO) Take 1,000 mcg by mouth daily.    [name] Kitchen ezetimibe (ZETIA) 10 MG tablet TAKE 1 TABLET(10 MG) BY MOUTH DAILY  . glucose blood test strip One Touch Ultra II strips. Use to test blood sugar three times daily. Dx: E11.65  . insulin detemir (LEVEMIR) 100 UNIT/ML injection Inject 0.2 mLs (20 Units total) into the skin at bedtime.  . Insulin Syringe-Needle U-100 (INSULIN SYRINGE 1CC/31GX5/16") 31G X 5/16" 1 ML MISC USE AS DIRECTED DAILY WITH LEVEMIR  . lidocaine (XYLOCAINE) 2 % solution Use as directed 15 mLs in the mouth or throat 3 (three) times daily before meals for 14 days.  [name] Kitchen loratadine (CLARITIN) 10 MG tablet Take 10 mg by mouth daily as needed for allergies.  [name] Kitchen MAGNESIUM PO Take 500 mg by mouth 2 (two) times daily in the am and at bedtime..  . Multiple Vitamins-Minerals (MULTIVITAMIN WITH MINERALS) tablet Take 1 tablet by mouth daily.    . nadolol (CORGARD) 40 MG tablet Take 1 tablet (40 mg total) by mouth daily.  [name] Kitchen NOVOLOG FLEXPEN 100 UNIT/ML FlexPen Inject 12 units under the skin every morning, 8 units at lunch and 12 units at supper  . ondansetron (ZOFRAN) 4 MG tablet Take 1 tablet (4 mg total) by mouth every 6 (six) hours as needed for nausea.  . pantoprazole (PROTONIX) 40 MG tablet Take 1 tablet (40 mg total) by mouth 2 (two) times daily.  Vladimir Faster Glycol-Propyl Glycol (SYSTANE OP) Place 1 drop into both eyes 2 (two) times daily.  . potassium chloride SA (KLOR-CON) 20 MEQ tablet Take 1 tablet (20 mEq total) by mouth daily.  . Probiotic Product (PROBIOTIC DAILY PO) Take 1 capsule by mouth daily. Digestive Advantage Probiotic  . spironolactone (ALDACTONE) 25 MG tablet Take 1 tablet (25 mg total) by mouth 2 (two) times daily.  . sucralfate (CARAFATE) 1 GM/10ML suspension Take 10 mLs (1 g total) by mouth 4 (four) times daily -  with meals and at bedtime for 14 days.   No facility-administered encounter medications on file as of  04/15/2019.     Review of Systems:  Review of Systems  Constitutional: Positive for activity change, appetite change, fatigue and unexpected weight change. Negative for chills and fever.  HENT: Positive for trouble swallowing.   Respiratory: Positive for shortness  of breath. Negative for cough and wheezing.   Cardiovascular: Negative for chest pain, palpitations and leg swelling.  Gastrointestinal: Positive for nausea. Negative for abdominal distention, abdominal pain, blood in stool, constipation, diarrhea and vomiting.  Genitourinary: Negative for dysuria.  Musculoskeletal: Positive for back pain and gait problem.  Skin: Negative for color change.  Neurological: Positive for weakness. Negative for dizziness.  Hematological: Bruises/bleeds easily.  Psychiatric/Behavioral: Negative for agitation and confusion. The patient is not nervous/anxious.     Health Maintenance  Topic Date Due  . OPHTHALMOLOGY EXAM  08/24/2018  . HEMOGLOBIN A1C  09/29/2019  . FOOT EXAM  10/04/2019  . URINE MICROALBUMIN  01/31/2020  . MAMMOGRAM  09/25/2020  . COLONOSCOPY  07/07/2021  . INFLUENZA VACCINE  Completed  . DEXA SCAN  Completed  . Hepatitis C Screening  Completed  . PNA vac Low Risk Adult  Completed  . TETANUS/TDAP  Discontinued    Physical Exam: Vitals:   04/15/19 1054  BP: 128/64  Pulse: 60  Temp: (!) 96 F (35.6 C)  TempSrc: Temporal  SpO2: 96%  Weight: 169 lb (76.7 kg)  Height: 5' 3"  (1.6 m)   Body mass index is 29.94 kg/m. Physical Exam Constitutional:      General: She is not in acute distress.    Appearance: Normal appearance. She is ill-appearing. She is not toxic-appearing.  HENT:     Head: Normocephalic and atraumatic.  Eyes:     General: Scleral icterus present.  Neck:     Musculoskeletal: Neck supple.  Cardiovascular:     Rate and Rhythm: Normal rate and regular rhythm.  Pulmonary:     Effort: Pulmonary effort is normal.     Comments: Diminished at both  bases Abdominal:     General: Bowel sounds are normal. There is distension.     Palpations: Abdomen is soft. There is no mass.     Tenderness: There is no abdominal tenderness. There is no right CVA tenderness, left CVA tenderness, guarding or rebound.     Hernia: No hernia is present.  Musculoskeletal: Normal range of motion.     Right lower leg: Edema present.     Left lower leg: Edema present.     Comments: Using rollator walker, walking slowly; 2+ edema of feet and ankles; abdominal ascites  Skin:    General: Skin is warm and dry.     Capillary Refill: Capillary refill takes less than 2 seconds.     Comments: Grayish yellow tone to skin  Neurological:     General: No focal deficit present.     Mental Status: She is alert and oriented to person, place, and time.     Motor: Weakness present.     Gait: Gait abnormal.     Comments: No asterixis  Psychiatric:        Mood and Affect: Mood normal.        Behavior: Behavior normal.        Thought Content: Thought content normal.        Judgment: Judgment normal.     Labs reviewed: Basic Metabolic Panel: Recent Labs    04/10/19 0641 04/11/19 0425 04/12/19 0303  NA 136 139 142  K 3.9 3.6 3.3*  CL 110 113* 113*  CO2 17* 18* 19*  GLUCOSE 135* 198* 84  BUN 34* 31* 20  CREATININE 2.09* 1.44* 0.90  CALCIUM 8.0* 8.3* 8.6*  MG  --  1.8 1.6*   Liver Function Tests: Recent Labs  04/02/19 0405 04/06/19 1300 04/07/19 0350  AST 36 46* 36  ALT 33 34 29  ALKPHOS 80 95 71  BILITOT 1.1 1.4* 1.3*  PROT 6.0* 7.3 6.0*  ALBUMIN 2.6* 2.9* 2.4*   Recent Labs    04/01/19 1319 04/06/19 1300  LIPASE 20 23   No results for input(s): AMMONIA in the last 8760 hours. CBC: Recent Labs    10/02/18 0922 01/24/19 0907  04/03/19 0359  04/10/19 0835 04/11/19 0425 04/12/19 0303  WBC 6.1 6.2   < > 4.5   < > 11.9* 7.9 8.3  NEUTROABS 3,819 3,745  --  2.4  --   --   --   --   HGB 14.2 15.1   < > 13.6   < > 12.0 11.9* 12.7  HCT 41.1  44.6   < > 41.9   < > 35.4* 35.4* 37.3  MCV 93.6 90.8   < > 96.3   < > 93.4 93.2 92.3  PLT 106* 132*   < > 109*   < > 64* 54* 56*   < > = values in this interval not displayed.   Lipid Panel: Recent Labs    05/24/18 0857  CHOL 147  HDL 53  LDLCALC 80  TRIG 64  CHOLHDL 2.8   Lab Results  Component Value Date   HGBA1C 8.4 (H) 04/01/2019    Procedures since last visit: Ct Abdomen Pelvis W Contrast  Result Date: 04/06/2019 CLINICAL DATA:  Patient with vomiting, diarrhea and right upper quadrant pain. Status post esophageal band. EXAM: CT ABDOMEN AND PELVIS WITH CONTRAST TECHNIQUE: Multidetector CT imaging of the abdomen and pelvis was performed using the standard protocol following bolus administration of intravenous contrast. CONTRAST:  171m OMNIPAQUE IOHEXOL 300 MG/ML  SOLN COMPARISON:  MRI abdomen 04/07/2017 FINDINGS: Lower chest: Normal heart size. Dependent atelectasis within the bilateral lower lobes. No pleural effusion. Hepatobiliary: Morphologic changes to the liver compatible with cirrhosis. Heterogeneous attenuation of the liver. Mild gallbladder wall thickening, likely secondary to cirrhosis. No definite intrahepatic or extrahepatic biliary ductal dilatation. The portal vein is attenuated centrally (image 22; series 3) most compatible thrombus. Pancreas: Unremarkable Spleen: Enlarged and heterogeneous in attenuation. And like low-attenuation centrally. Splenic infarct not entirely excluded. Adrenals/Urinary Tract: Normal adrenal glands. Kidneys enhance symmetrically with contrast. No hydronephrosis. Urinary bladder is unremarkable. Stomach/Bowel: There is circumferential wall thickening of the cecum and ascending colon. No evidence for small bowel obstruction. Normal morphology of the stomach. Small amount of fluid within the small bowel mesentery. Paraesophageal varices. Vascular/Lymphatic: Normal caliber abdominal aorta. Peripheral calcified atherosclerotic plaque. No  retroperitoneal lymphadenopathy. Reproductive: Status post hysterectomy. Other: Moderate volume ascites throughout the abdomen. Musculoskeletal: Lumbar spine degenerative changes. Lumbar spinal fusion hardware. No aggressive or acute appearing osseous lesions. IMPRESSION: 1. The portal vein is attenuated centrally most compatible with portal venous thrombus which appears to have progressed from prior exam. 2. There is circumferential wall thickening of the cecum and ascending colon which may be secondary to portal venous hypertension or colitis. 3. Morphologic changes to the liver compatible with cirrhosis. Heterogeneous attenuation of the liver which may be secondary to cirrhosis. 4. Bandlike heterogeneity of the spleen raising the possibility of splenic infarct. 5. Moderate volume ascites throughout the abdomen and pelvis. 6. Paraesophageal varices. Electronically Signed   By: DLovey NewcomerM.D.   On: 04/06/2019 15:56    Assessment/Plan 1. Bleeding esophageal varices, unspecified esophageal varices type ([place] - f/u cbc today - still struggling with dysphagia--on pureed  diet and intake low - CBC with Differential/Platelet  2. Cirrhosis of liver with ascites, unspecified hepatic cirrhosis type ([place]) - nonalcoholic -will attempt to increase diuretics to half normal dose  -she is to let us know if she gets dizzy or hypotensive with home health - CBC with Differential/Platelet - Basic metabolic panel - Hepatic function panel - spironolactone (ALDACTONE) 50 MG tablet; Take 1 tablet (50 mg total) by mouth 2 (two) times daily. - furosemide (LASIX) 20 MG tablet; Take 1 tablet (20 mg total) by mouth daily.  Dispense: 30 tablet; Refill: 3  3. Uncontrolled type 2 diabetes mellitus with hyperglycemia ([facility]) - insulin was reduced due to her poor intake - high risk for hypoglycemia at this point so I will not make changes at this time - COMPLETE METABOLIC PANEL WITH GFR - CBC with Differential/Platelet  4.  Coronary artery disease of native artery of native heart with stable angina pectoris ([place]) -no active complaints -unfortunately off jardiance now due to acidosis during hospitalization  5.  Portal vein thrombosis -s/p tips procedure -f/u with Dr. [name] on Wednesday  Labs/tests ordered:   Lab Orders     CBC with Differential/Platelet     Basic metabolic panel     Hepatic function panel  Next appt:  Keep Jan appt and labs before  Usbaldo Pannone L. Mellonie Guess, D.O. [facility] [address],  [address] Cell Phone (Mon-Fri 8am-5pm):  [phone] On Call:  [phone] & follow prompts after 5pm & weekends Office Phone:  [phone] Office Fax:  [phone]

## 2019-04-15 NOTE — Patient Outreach (Signed)
[facility]) Care Management  04/15/2019  KAAVYA PUSKARICH 1946-03-30 [id number]   Referral initially received from care management assistant for red flag on EMMI general discharge dashboard however member was readmitted back to the hospital.  She was admitted 11/23-11/25 for dysphagia related to esophageal varices.  She was readmitted 11/28-12/4 again for esophageal varices.  Primary MD office completed TOC assessment.  Per chart, she also has history of hypertension, CAD, GERD, cirrhosis of the liver, diabetes, and hyperlipidemia.    Call placed to member, no answer.  HIPAA compliant voice message left, unsuccessful outreach letter sent.  Noted per chart she has appointment this morning with PCP.  Will follow up within the next 3-4 business days.  [name], [place], MSN [place] [phone]

## 2019-04-15 NOTE — Telephone Encounter (Signed)
Left message on machine to call back  

## 2019-04-15 NOTE — Telephone Encounter (Signed)
I spoke with Dr. Mariea Clonts, she is going to get Ms. Fairbank back on half of her usual Aldactone dose and half of her usual Lasix dose.  She will be therefore taking 50 mg Aldactone twice daily and 40 mg Lasix once daily.  She is having labs checked today.   Patty, Can you reach out to the patient and offer her Wednesday morning this week appointment, double book if needed.  My plan will be to increase her diuretics further if she is tolerating.

## 2019-04-15 NOTE — Telephone Encounter (Signed)
The pt has been scheduled for 12/9 at 950 am.  She has been notified and verbalized understanding.

## 2019-04-16 DIAGNOSIS — K3189 Other diseases of stomach and duodenum: Secondary | ICD-10-CM | POA: Diagnosis not present

## 2019-04-16 DIAGNOSIS — I81 Portal vein thrombosis: Secondary | ICD-10-CM | POA: Diagnosis not present

## 2019-04-16 DIAGNOSIS — K7469 Other cirrhosis of liver: Secondary | ICD-10-CM | POA: Diagnosis not present

## 2019-04-16 DIAGNOSIS — I8501 Esophageal varices with bleeding: Secondary | ICD-10-CM | POA: Diagnosis not present

## 2019-04-16 DIAGNOSIS — I2510 Atherosclerotic heart disease of native coronary artery without angina pectoris: Secondary | ICD-10-CM | POA: Diagnosis not present

## 2019-04-16 DIAGNOSIS — K7581 Nonalcoholic steatohepatitis (NASH): Secondary | ICD-10-CM | POA: Diagnosis not present

## 2019-04-16 LAB — CBC WITH DIFFERENTIAL/PLATELET
Absolute Monocytes: 1606 cells/uL — ABNORMAL HIGH (ref 200–950)
Basophils Absolute: 66 cells/uL (ref 0–200)
Basophils Relative: 0.6 %
Eosinophils Absolute: 220 cells/uL (ref 15–500)
Eosinophils Relative: 2 %
HCT: 39.4 % (ref 35.0–45.0)
Hemoglobin: 14 g/dL (ref 11.7–15.5)
Lymphs Abs: 1397 cells/uL (ref 850–3900)
MCH: 31.8 pg (ref 27.0–33.0)
MCHC: 35.5 g/dL (ref 32.0–36.0)
MCV: 89.5 fL (ref 80.0–100.0)
MPV: 12.5 fL (ref 7.5–12.5)
Monocytes Relative: 14.6 %
Neutro Abs: 7711 cells/uL (ref 1500–7800)
Neutrophils Relative %: 70.1 %
Platelets: 101 10*3/uL — ABNORMAL LOW (ref 140–400)
RBC: 4.4 10*6/uL (ref 3.80–5.10)
RDW: 14.4 % (ref 11.0–15.0)
Total Lymphocyte: 12.7 %
WBC: 11 10*3/uL — ABNORMAL HIGH (ref 3.8–10.8)

## 2019-04-16 LAB — BASIC METABOLIC PANEL WITH GFR
BUN: 11 mg/dL (ref 7–25)
CO2: 25 mmol/L (ref 20–32)
Calcium: 8.9 mg/dL (ref 8.6–10.4)
Chloride: 107 mmol/L (ref 98–110)
Creat: 0.68 mg/dL (ref 0.60–0.93)
GFR, Est African American: 101 mL/min/{1.73_m2} (ref >=60)
GFR, Est Non African American: 87 mL/min/{1.73_m2} (ref >=60)
Glucose, Bld: 105 mg/dL — ABNORMAL HIGH (ref 65–99)
Potassium: 3.9 mmol/L (ref 3.5–5.3)
Sodium: 140 mmol/L (ref 135–146)

## 2019-04-16 LAB — HEPATIC FUNCTION PANEL
AG Ratio: 1.3 (calc) (ref 1.0–2.5)
ALT: 237 U/L — ABNORMAL HIGH (ref 6–29)
AST: 138 U/L — ABNORMAL HIGH (ref 10–35)
Albumin: 3.1 g/dL — ABNORMAL LOW (ref 3.6–5.1)
Alkaline phosphatase (APISO): 212 U/L — ABNORMAL HIGH (ref 37–153)
Bilirubin, Direct: 2.3 mg/dL — ABNORMAL HIGH (ref 0.0–0.2)
Globulin: 2.4 g/dL (calc) (ref 1.9–3.7)
Indirect Bilirubin: 2.6 mg/dL (calc) — ABNORMAL HIGH (ref 0.2–1.2)
Total Bilirubin: 4.9 mg/dL — ABNORMAL HIGH (ref 0.2–1.2)
Total Protein: 5.5 g/dL — ABNORMAL LOW (ref 6.1–8.1)

## 2019-04-17 ENCOUNTER — Telehealth: Payer: Self-pay | Admitting: Gastroenterology

## 2019-04-17 ENCOUNTER — Other Ambulatory Visit (INDEPENDENT_AMBULATORY_CARE_PROVIDER_SITE_OTHER): Payer: Medicare Other

## 2019-04-17 ENCOUNTER — Telehealth: Payer: Self-pay

## 2019-04-17 ENCOUNTER — Other Ambulatory Visit: Payer: Self-pay

## 2019-04-17 ENCOUNTER — Ambulatory Visit (INDEPENDENT_AMBULATORY_CARE_PROVIDER_SITE_OTHER): Payer: Medicare Other | Admitting: Gastroenterology

## 2019-04-17 ENCOUNTER — Encounter: Payer: Self-pay | Admitting: Gastroenterology

## 2019-04-17 VITALS — BP 92/50 | HR 56 | Temp 97.4°F | Ht 63.0 in | Wt 164.8 lb

## 2019-04-17 DIAGNOSIS — R188 Other ascites: Secondary | ICD-10-CM

## 2019-04-17 DIAGNOSIS — K7460 Unspecified cirrhosis of liver: Secondary | ICD-10-CM

## 2019-04-17 DIAGNOSIS — I25810 Atherosclerosis of coronary artery bypass graft(s) without angina pectoris: Secondary | ICD-10-CM

## 2019-04-17 LAB — CBC WITH DIFFERENTIAL/PLATELET
Basophils Absolute: 0.1 10*3/uL (ref 0.0–0.1)
Basophils Relative: 1.1 % (ref 0.0–3.0)
Eosinophils Absolute: 0.1 10*3/uL (ref 0.0–0.7)
Eosinophils Relative: 1.6 % (ref 0.0–5.0)
HCT: 40.6 % (ref 36.0–46.0)
Hemoglobin: 13.6 g/dL (ref 12.0–15.0)
Lymphocytes Relative: 15.5 % (ref 12.0–46.0)
Lymphs Abs: 1.1 10*3/uL (ref 0.7–4.0)
MCHC: 33.5 g/dL (ref 30.0–36.0)
MCV: 94.2 fl (ref 78.0–100.0)
Monocytes Absolute: 1.3 10*3/uL — ABNORMAL HIGH (ref 0.1–1.0)
Monocytes Relative: 18.1 % — ABNORMAL HIGH (ref 3.0–12.0)
Neutro Abs: 4.5 10*3/uL (ref 1.4–7.7)
Neutrophils Relative %: 63.7 % (ref 43.0–77.0)
Platelets: 113 10*3/uL — ABNORMAL LOW (ref 150.0–400.0)
RBC: 4.31 Mil/uL (ref 3.87–5.11)
RDW: 15.7 % — ABNORMAL HIGH (ref 11.5–15.5)
WBC: 7.1 10*3/uL (ref 4.0–10.5)

## 2019-04-17 LAB — PROTIME-INR
INR: 1.5 ratio — ABNORMAL HIGH (ref 0.8–1.0)
Prothrombin Time: 17.3 s — ABNORMAL HIGH (ref 9.6–13.1)

## 2019-04-17 LAB — COMPREHENSIVE METABOLIC PANEL
ALT: 187 U/L — ABNORMAL HIGH (ref 0–35)
AST: 146 U/L — ABNORMAL HIGH (ref 0–37)
Albumin: 3.2 g/dL — ABNORMAL LOW (ref 3.5–5.2)
Alkaline Phosphatase: 256 U/L — ABNORMAL HIGH (ref 39–117)
BUN: 11 mg/dL (ref 6–23)
CO2: 27 mEq/L (ref 19–32)
Calcium: 9.3 mg/dL (ref 8.4–10.5)
Chloride: 105 mEq/L (ref 96–112)
Creatinine, Ser: 0.72 mg/dL (ref 0.40–1.20)
GFR: 79.54 mL/min (ref >60.00)
Glucose, Bld: 108 mg/dL — ABNORMAL HIGH (ref 70–99)
Potassium: 3.8 mEq/L (ref 3.5–5.1)
Sodium: 138 mEq/L (ref 135–145)
Total Bilirubin: 4.5 mg/dL — ABNORMAL HIGH (ref 0.2–1.2)
Total Protein: 6 g/dL (ref 6.0–8.3)

## 2019-04-17 NOTE — Telephone Encounter (Signed)
Noted.  Glad she was ok otherwise.  Hoping bp remains steady with diuretics added back.  Is seeing GI today.

## 2019-04-17 NOTE — Progress Notes (Signed)
Review of pertinent gastrointestinal problems: 1. Personal history of adenomatous colon polyps. Colonoscopy Dr. [name] 2012 August was normal except for 2 subcentimeter polyps, one was a tubular adenoma. She was recommended to have recall colonoscopy at five-year interval. She had also had previous colonoscopies with Dr. Lajoyce Corners and found to have adenomas as well.Colonoscopy Dr. [name] 07/2016 one subCM TA, recall at 5 years 2. Cirrhosis: likely from Fatty liver  Paracentesis 11/20174 liters, elevated SAAG, no SBP, cytology neg for malignancy  Most recent imaging:03/2017 [place]: cirrhosis, splenomegaly, NEW partial (non-occlusive) PV thrombus, Gallstones and new "mild dilation of intra and extrahepatic bile ducts"; MRI 03/2017 biliary findings probably from extensive periportal fibrosis, small gallstones in GB, non-occlusive PV thrombus appears chronic.US 10/2017: gallstones, thick GB, cirrhosis without masses in liver.  04/2018 ultrasound cirrhosis without focal hepatic lesions.  Most recent AFP:03/2019 normal  MELD-NA;04/2018 labs : 10  Most recent EGD:07/2016 EGD: large varices, + portal gastropathy; started nadolol after discussion with cardiology(nadolol 80m once dialy); no recall necessary since on Nadolol.  Immunization for Hep A/B:Started 06/2016  PV thrombus 2018 imaging, non-occlusive and chronic per MR;hematology workup and I agreed; no plans for anticoag (given large varices).  Very large varices causing dysphagia despite 80 of nadolol daily.  EGD November 2020 placement of 14 variceal ligating bands.  This was complicated by significant chest pain and dysphagia afterwards requiring hospital admission.  Further complicated by bleeding from band ulcer site and eventual TIPS placement November 2020.  HPI: This is a very pleasant 73year old woman who is here with her son today  I last saw her at the time of a banding procedure for a very large esophageal varices that were present  despite 80 mg of nadolol daily.  These were causing dysphagia.  Significant complications following the banding procedure.  See the summary above.  Today she is here slowly recovering from all of the complications.  Blood work 2 days ago by her primary care physician showed decline in hepatic function.  I believe she was discharged home being told to be on her diuretics however she was not on her diuretics as well.  Half dose diuretics were started 2 days ago.  She is clearly mobilizing the fluid urinating quite often.  She has lost 5 pounds since her PCP visit a couple days ago.  She does not have encephalopathy.  She is not on lactulose fortunately.  She is very weak.  She is eating but not yet normally.  ROS: complete GI ROS as described in HPI, all other review negative.  Constitutional:  No unintentional weight loss   Past Medical History:  Diagnosis Date  . Allergy   . Arthritis    neck  . Cataract    bilateral - MD monitoring cataracts  . CHF (congestive heart failure) ([place]   . Chronic kidney disease, stage I    DR OTTELIN  HX UTIS  . Cirrhosis ([place]   . Cramp of limb   . Diabetes mellitus   . Dysphagia, unspecified(787.20)   . Dysuria   . Epistaxis   . GERD (gastroesophageal reflux disease)   . Heart murmur    NO CARDIOLOGIST  DX FOR YEARS ASYMPTOMATIC  . Lumbago   . Neoplasm of uncertain behavior of skin   . Nonspecific elevation of levels of transaminase or lactic acid dehydrogenase (LDH)   . Osteoarthrosis, unspecified whether generalized or localized, unspecified site   . Other and unspecified hyperlipidemia    diet controlled  . Pain in  joint, shoulder region   . Paresthesias 04/01/2015  . Postablative ovarian failure   . Trochanteric bursitis of left hip 12/15/2015  . Type 2 diabetes mellitus without complication ([place])   . Unspecified essential hypertension    no meds    Past Surgical History:  Procedure Laterality Date  . BREAST BIOPSY    . CARDIAC  CATHETERIZATION N/A 01/27/2016   Procedure: Left Heart Cath and Coronary Angiography;  Surgeon: Belva Crome, MD;  Location: [facility];  Service: Cardiovascular;  Laterality: N/A;  . COLONOSCOPY  2012   Ilithyia Titzer - polyp  . COLONOSCOPY WITH PROPOFOL N/A 07/07/2016   Procedure: COLONOSCOPY WITH PROPOFOL;  Surgeon: [name], MD;  Location: [facility];  Service: Endoscopy;  Laterality: N/A;  . CORONARY ARTERY BYPASS GRAFT N/A 01/28/2016   Procedure: CORONARY ARTERY BYPASS GRAFTING (CABG) x 3 USING RIGHT LEG GREATER SAPHENOUS VEIN GRAFT;  Surgeon: Melrose Nakayama, MD;  Location: [facility];  Service: Open Heart Surgery;  Laterality: N/A;  . ENDOVEIN HARVEST OF GREATER SAPHENOUS VEIN Right 01/28/2016   Procedure: ENDOVEIN HARVEST OF GREATER SAPHENOUS VEIN;  Surgeon: Melrose Nakayama, MD;  Location: [facility];  Service: Open Heart Surgery;  Laterality: Right;  . ESOPHAGEAL BANDING  03/28/2019   Procedure: ESOPHAGEAL BANDING;  Surgeon: [name], MD;  [facility];  Service: Endoscopy;;  . ESOPHAGEAL BANDING  04/07/2019   Procedure: ESOPHAGEAL BANDING;  Surgeon: Juanita Craver, MD;  Location: [facility]COPY;  Service: Endoscopy;;  . ESOPHAGOGASTRODUODENOSCOPY N/A 04/07/2019   Procedure: ESOPHAGOGASTRODUODENOSCOPY (EGD);  Surgeon: Juanita Craver, MD;  Location: [facility] ENDOSCOPY;  Service: Endoscopy;  Laterality: N/A;  . ESOPHAGOGASTRODUODENOSCOPY (EGD) WITH PROPOFOL N/A 07/07/2016   Procedure: ESOPHAGOGASTRODUODENOSCOPY (EGD) WITH PROPOFOL;  Surgeon: [name], MD;  [facility];  Service: Endoscopy;  Laterality: N/A;  . ESOPHAGOGASTRODUODENOSCOPY (EGD) WITH PROPOFOL N/A 03/28/2019   Procedure: ESOPHAGOGASTRODUODENOSCOPY (EGD) WITH PROPOFOL;  Surgeon: [name], MD;  [facility];  Service: Endoscopy;  Laterality: N/A;  . HEMOSTASIS CLIP PLACEMENT  04/07/2019   Procedure: HEMOSTASIS CLIP PLACEMENT;  Surgeon: Juanita Craver, MD;  Location: [facility];   Service: Endoscopy;;  . IR ANGIOGRAM SELECTIVE EACH ADDITIONAL VESSEL  04/08/2019  . IR EMBO ART  VEN HEMORR LYMPH EXTRAV  INC GUIDE ROADMAPPING  04/08/2019  . IR PARACENTESIS  04/08/2019  . IR TIPS  04/08/2019  . MAXIMUM ACCESS (MAS)POSTERIOR LUMBAR INTERBODY FUSION (PLIF) 1 LEVEL Left 06/10/2015   Procedure: FOR MAXIMUM ACCESS (MAS) POSTERIOR LUMBAR INTERBODY FUSION (PLIF) LUMBAR THREE-FOUR EXTRAFORAMINAL MICRODISCECTOMY LUMBAR FIVE-SACRAL ONE LEFT;  Surgeon: Eustace Moore, MD;  Location: [facility];  Service: Neurosurgery;  Laterality: Left;  . RADIOLOGY WITH ANESTHESIA N/A 04/08/2019   Procedure: RADIOLOGY WITH ANESTHESIA;  Surgeon: Radiologist, Medication, MD;  Location: [facility];  Service: Radiology;  Laterality: N/A;  . SCLEROTHERAPY  04/07/2019   Procedure: SCLEROTHERAPY;  Surgeon: Juanita Craver, MD;  Location: [facility] ENDOSCOPY;  Service: Endoscopy;;  . TEE WITHOUT CARDIOVERSION N/A 01/28/2016   Procedure: TRANSESOPHAGEAL ECHOCARDIOGRAM (TEE);  Surgeon: Melrose Nakayama, MD;  Location: [facility];  Service: Open Heart Surgery;  Laterality: N/A;  . TUBAL LIGATION  1982   Dr Connye Burkitt  . UPPER GASTROINTESTINAL ENDOSCOPY    . VAGINAL HYSTERECTOMY  1997   Dr Rande Lawman    Current Outpatient Medications  Medication Sig Dispense Refill  . acetaminophen (TYLENOL) 500 MG tablet Take 500 mg by mouth at bedtime.     . Aromatic Inhalants (VICKS VAPOR  IN) Vicks Vapor Rub apply small amount to outside of nose to help breathing    . BD PEN NEEDLE NANO U/F 32G X 4 MM MISC USE THREE TIMES DAILY AS DIRECTED 100 each 6  . Biotin 10000 MCG TABS Take 10,000 mcg by mouth every morning.    . bisacodyl (DULCOLAX) 10 MG suppository Place 1 suppository (10 mg total) rectally daily as needed for moderate constipation. 12 suppository 0  . calcium carbonate (OS-CAL) 600 MG TABS Take 600 mg by mouth 2 (two) times daily with a meal.      . Cholecalciferol (VITAMIN D) 50 MCG (2000 UT) CAPS Take 2,000 Units by mouth daily.     .  Cyanocobalamin (VITAMIN B 12 PO) Take 1,000 mcg by mouth daily.      [name] Kitchen ezetimibe (ZETIA) 10 MG tablet TAKE 1 TABLET(10 MG) BY MOUTH DAILY 90 tablet 1  . furosemide (LASIX) 20 MG tablet Take 1 tablet (20 mg total) by mouth daily. 30 tablet 3  . glucose blood test strip One Touch Ultra II strips. Use to test blood sugar three times daily. Dx: E11.65 300 each 3  . insulin detemir (LEVEMIR) 100 UNIT/ML injection Inject 0.2 mLs (20 Units total) into the skin at bedtime.    . Insulin Syringe-Needle U-100 (INSULIN SYRINGE 1CC/31GX5/16") 31G X 5/16" 1 ML MISC USE AS DIRECTED DAILY WITH LEVEMIR 100 each 2  . lidocaine (XYLOCAINE) 2 % solution Use as directed 15 mLs in the mouth or throat 3 (three) times daily before meals for 14 days. 630 mL 0  . loratadine (CLARITIN) 10 MG tablet Take 10 mg by mouth daily as needed for allergies.    [name] Kitchen MAGNESIUM PO Take 500 mg by mouth 2 (two) times daily in the am and at bedtime..    . Multiple Vitamins-Minerals (MULTIVITAMIN WITH MINERALS) tablet Take 1 tablet by mouth daily.      . nadolol (CORGARD) 40 MG tablet Take 1 tablet (40 mg total) by mouth daily. 90 tablet 1  . NOVOLOG FLEXPEN 100 UNIT/ML FlexPen Inject 12 units under the skin every morning, 8 units at lunch and 12 units at supper 15 mL 3  . ondansetron (ZOFRAN) 4 MG tablet Take 1 tablet (4 mg total) by mouth every 6 (six) hours as needed for nausea. 20 tablet 0  . pantoprazole (PROTONIX) 40 MG tablet Take 1 tablet (40 mg total) by mouth 2 (two) times daily. 60 tablet 0  . Polyethyl Glycol-Propyl Glycol (SYSTANE OP) Place 1 drop into both eyes 2 (two) times daily.    . Probiotic Product (PROBIOTIC DAILY PO) Take 1 capsule by mouth daily. Digestive Advantage Probiotic    . spironolactone (ALDACTONE) 50 MG tablet Take 1 tablet (50 mg total) by mouth 2 (two) times daily.    . sucralfate (CARAFATE) 1 GM/10ML suspension Take 10 mLs (1 g total) by mouth 4 (four) times daily -  with meals and at bedtime for 14 days. 560  mL 0   No current facility-administered medications for this visit.     Allergies as of 04/17/2019 - Review Complete 04/17/2019  Allergen Reaction Noted  . Kiwi extract Anaphylaxis 05/28/2015  . Tdap [tetanus-diphth-acell pertussis] Swelling and Other (See Comments) 03/27/2013  . Statins  05/08/2016  . Latex Itching, Dermatitis, and Rash 12/22/2010  . Tramadol Nausea And Vomiting 05/28/2015    Family History  Problem Relation Age of Onset  . Lung cancer Father   . Arthritis Sister   . Arthritis Brother   .  Heart disease Maternal Grandmother   . Heart disease Maternal Grandfather   . Heart disease Paternal Grandmother   . Heart disease Paternal Grandfather   . Breast cancer Mother   . Liver cancer Brother   . Breast cancer Maternal Aunt   . Breast cancer Paternal Aunt   . Colon cancer Neg Hx   . Esophageal cancer Neg Hx   . Rectal cancer Neg Hx   . Stomach cancer Neg Hx     Social History   Socioeconomic History  . Marital status: Married    Spouse name: Not on file  . Number of children: 6  . Years of education: Not on file  . Highest education level: Not on file  Occupational History  . Occupation: retired  Scientific laboratory technician  . Financial resource strain: Not hard at all  . Food insecurity    Worry: Never true    Inability: Never true  . Transportation needs    Medical: No    Non-medical: No  Tobacco Use  . Smoking status: Never Smoker  . Smokeless tobacco: Never Used  Substance and Sexual Activity  . Alcohol use: No  . Drug use: No  . Sexual activity: Yes    Partners: Male    Birth control/protection: Post-menopausal, Surgical    Comment: Hysterectomy  Lifestyle  . Physical activity    Days per week: 0 days    Minutes per session: 0 min  . Stress: Not at all  Relationships  . Social connections    Talks on phone: More than three times a week    Gets together: Twice a week    Attends religious service: More than 4 times per year    Active member of  club or organization: No    Attends meetings of clubs or organizations: Never    Relationship status: Married  . Intimate partner violence    Fear of current or ex partner: No    Emotionally abused: No    Physically abused: No    Forced sexual activity: No  Other Topics Concern  . Not on file  Social History Narrative  . Not on file     Physical Exam: BP (!) 92/50   Pulse (!) 56   Temp (!) 97.4 F (36.3 C)   Ht 5' 3"  (1.6 m)   Wt 164 lb 12.8 oz (74.8 kg)   BMI 29.19 kg/m  Constitutional: Chronically ill-appearing Psychiatric: alert and oriented x3 Abdomen: soft, nontender, nondistended, mild to moderate obvious ascites, no peritoneal signs, normal bowel sounds 1-2+ peripheral edema in ankles  Assessment and plan: 73 y.o. female with decompensated cirrhosis  Variceal ligating for very large varices causing dysphagia despite 80 mg of nadolol daily led to significant cascade of events, complications.  She is slowly recovering from all of that.  I am going to increase her diuretics back to her usual doses.  She will start taking Lasix 40 mg once daily and spironolactone 100 mg twice daily.  She is going to stop nadolol she is going to stop Carafate she is going to stop lidocaine.  She is going to start taking a single over-the-counter Imodium pill 1 pill once daily shortly after she wakes up every morning for her diarrhea.  Her son got check to see if she was started on lactulose at home.  She will advance her diet to more solid foods and pay close attention to make sure she is on a low-salt only.  She will have return  office visit with me sometime next week and labs early next week Monday, CBC, complete metabolic profile, coags.  Please see the "Patient Instructions" section for addition details about the plan.  Owens Loffler, MD [facility] 04/17/2019, 10:12 AM

## 2019-04-17 NOTE — Telephone Encounter (Signed)
Patient's physical therapist called this morning Tharon Aquas) and stated she is required by law to call being that patient's pulse was under 68 yesterday. She states patient's pulse yesterday was 58, but patient seemed fine. She had no further comments.   Routing to provider to inform.

## 2019-04-17 NOTE — Patient Instructions (Addendum)
Check to see when you get home to see if you are taking Lactulose.   Increase Lasix to 42m once daily.   Increase Spirolactone to 1024mtwice daily.   Start imodium once daily in the morning.   Discontinue Nadolol, sucralfate, and lidocaine.   Your provider has requested that you go to the basement level for lab work before leaving today. Press "B" on the elevator. The lab is located at the first door on the left as you exit the elevator.  Eat more solid foods with low salt intake.   We will call you with appointment for next week.   Thank you for choosing me and [facility].  Dr. JaArdis Hughs

## 2019-04-18 NOTE — Telephone Encounter (Signed)
Dr. [name], pt called to inform us that she realized when she got home that she is already taking 9m of Lasix once daily. Do you want her to increase anymore than that at this point? She also states that she is not taking Lactulose. She is scheduled for rtn visit next Wed. Please advise.

## 2019-04-19 ENCOUNTER — Other Ambulatory Visit: Payer: Self-pay | Admitting: *Deleted

## 2019-04-19 ENCOUNTER — Encounter: Payer: Self-pay | Admitting: Family

## 2019-04-19 ENCOUNTER — Encounter: Payer: Self-pay | Admitting: *Deleted

## 2019-04-19 DIAGNOSIS — I81 Portal vein thrombosis: Secondary | ICD-10-CM | POA: Diagnosis not present

## 2019-04-19 DIAGNOSIS — K7581 Nonalcoholic steatohepatitis (NASH): Secondary | ICD-10-CM | POA: Diagnosis not present

## 2019-04-19 DIAGNOSIS — I2510 Atherosclerotic heart disease of native coronary artery without angina pectoris: Secondary | ICD-10-CM | POA: Diagnosis not present

## 2019-04-19 DIAGNOSIS — I8501 Esophageal varices with bleeding: Secondary | ICD-10-CM | POA: Diagnosis not present

## 2019-04-19 DIAGNOSIS — K3189 Other diseases of stomach and duodenum: Secondary | ICD-10-CM | POA: Diagnosis not present

## 2019-04-19 DIAGNOSIS — K7469 Other cirrhosis of liver: Secondary | ICD-10-CM | POA: Diagnosis not present

## 2019-04-19 NOTE — Patient Outreach (Signed)
[facility]) Care Management  04/19/2019  [name] 1945/08/05 [id number]   Outreach attempt #2, successful.    Referral initially received from care management assistant for red flag on EMMI general discharge dashboard however member was readmitted back to the hospital.  She was admitted 11/23-11/25 for dysphagia related to esophageal varices.  She was readmitted 11/28-12/4 again for esophageal varices.  Primary MD office completed TOC assessment.  Per chart, she also has history of hypertension, CAD, GERD, cirrhosis of the liver, diabetes, and hyperlipidemia.    Call placed to member to follow up on hospital discharge, identity verified.  This care manager introduced self and stated purpose of call.  [facility] care management services explained.  She report she was still feeling a little bad a few days after discharge but report she has "perked up well" now.  She has a GI appointment on 12/16, denies needing transportation assistance.  Lives with her husband who will take her.  State she has 5 adult children that are all in town and provide support to her as well when needed.  She has [facility] for home health PT and nursing, visit scheduled for later today.    State her biggest concern and goal is to get stronger.  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  Denies questions about medications management or the need for medication assistance.  Denies any urgent concerns, will follow up within the next 2 weeks.  Fall Risk  04/19/2019 04/15/2019 10/04/2018 06/04/2018 05/30/2018  Falls in the past year? 0 0 0 0 0  Number falls in past yr: 0 - 0 0 0  Injury with Fall? 0 - 0 0 0   Depression screen [facility] 2/9 04/19/2019 04/15/2019 10/04/2018 06/04/2018 05/30/2018  Decreased Interest 0 0 0 0 0  Down, Depressed, Hopeless 0 0 0 0 0  PHQ - 2 Score 0 0 0 0 0  Some recent data might be hidden   [facility] CM  Care Plan Problem One     Most Recent Value  Care Plan Problem One  Risk for readmission related to bleeding varicies as evidenced by Recent hospitalizations  Role Documenting the Problem One  Care Management [place] for Problem One  Active  [facility] Long Term Goal   Member will not be readmitted to hospital within the next 31 days  [facility] Long Term Goal Start Date  04/19/19  Interventions for Problem One Long Term Goal  Discharge instructions reviewed with member.  Educated on imortance of following plan of care in effort to decrease risk of readmission.  Upcoming appointments reviewed with member, denies need for transportation assistance  [facility] CM Short Term Goal #1   Member will report taking medicatiosn as prescribed over the next 2 weeks  [facility] CM Short Term Goal #1 Start Date  04/19/19  Interventions for Short Term Goal #1  Medications reviewed with member, advised of importance of taking as prescribed in order for them to be effective  [facility] CM Short Term Goal #2   Member will report increased strength over the next 2 weeks  [facility] CM Short Term Goal #2 Start Date  04/19/19  Interventions for Short Term Goal #2  Educated member on importance of following plan with PT, advised to perform independent exercise for optimal recovery     [name], Therapist, sports, MSN [place]  [phone]  

## 2019-04-19 NOTE — Telephone Encounter (Signed)
Pt informed to increase to 20m daily. Pt will have lab (cmet) before visit on Wednesday. Pt will continue other medications as directed.

## 2019-04-19 NOTE — Telephone Encounter (Signed)
She needs cmet the morning of her OV next week.  Please have her increase her lasix to a total of 16m once daily (officially change her med list to show this as well).  Continue with other med changes noted at OV a few days ago.    thanks

## 2019-04-22 ENCOUNTER — Other Ambulatory Visit: Payer: Self-pay | Admitting: Internal Medicine

## 2019-04-22 DIAGNOSIS — I25118 Atherosclerotic heart disease of native coronary artery with other forms of angina pectoris: Secondary | ICD-10-CM

## 2019-04-22 DIAGNOSIS — K3189 Other diseases of stomach and duodenum: Secondary | ICD-10-CM | POA: Diagnosis not present

## 2019-04-22 DIAGNOSIS — I8501 Esophageal varices with bleeding: Secondary | ICD-10-CM | POA: Diagnosis not present

## 2019-04-22 DIAGNOSIS — K7581 Nonalcoholic steatohepatitis (NASH): Secondary | ICD-10-CM | POA: Diagnosis not present

## 2019-04-22 DIAGNOSIS — K7460 Unspecified cirrhosis of liver: Secondary | ICD-10-CM

## 2019-04-22 DIAGNOSIS — I81 Portal vein thrombosis: Secondary | ICD-10-CM | POA: Diagnosis not present

## 2019-04-22 DIAGNOSIS — E1165 Type 2 diabetes mellitus with hyperglycemia: Secondary | ICD-10-CM

## 2019-04-22 DIAGNOSIS — K7469 Other cirrhosis of liver: Secondary | ICD-10-CM | POA: Diagnosis not present

## 2019-04-22 DIAGNOSIS — I2510 Atherosclerotic heart disease of native coronary artery without angina pectoris: Secondary | ICD-10-CM | POA: Diagnosis not present

## 2019-04-22 DIAGNOSIS — R188 Other ascites: Secondary | ICD-10-CM

## 2019-04-24 ENCOUNTER — Other Ambulatory Visit: Payer: Self-pay

## 2019-04-24 ENCOUNTER — Ambulatory Visit (INDEPENDENT_AMBULATORY_CARE_PROVIDER_SITE_OTHER): Payer: Medicare Other | Admitting: Gastroenterology

## 2019-04-24 ENCOUNTER — Other Ambulatory Visit (INDEPENDENT_AMBULATORY_CARE_PROVIDER_SITE_OTHER): Payer: Medicare Other

## 2019-04-24 ENCOUNTER — Encounter: Payer: Self-pay | Admitting: Gastroenterology

## 2019-04-24 VITALS — BP 110/62 | HR 62 | Temp 98.2°F | Ht 63.0 in | Wt 144.8 lb

## 2019-04-24 DIAGNOSIS — R188 Other ascites: Secondary | ICD-10-CM

## 2019-04-24 DIAGNOSIS — K7469 Other cirrhosis of liver: Secondary | ICD-10-CM

## 2019-04-24 DIAGNOSIS — I25810 Atherosclerosis of coronary artery bypass graft(s) without angina pectoris: Secondary | ICD-10-CM | POA: Diagnosis not present

## 2019-04-24 DIAGNOSIS — K7460 Unspecified cirrhosis of liver: Secondary | ICD-10-CM | POA: Diagnosis not present

## 2019-04-24 LAB — COMPREHENSIVE METABOLIC PANEL
ALT: 72 U/L — ABNORMAL HIGH (ref 0–35)
AST: 59 U/L — ABNORMAL HIGH (ref 0–37)
Albumin: 3.4 g/dL — ABNORMAL LOW (ref 3.5–5.2)
Alkaline Phosphatase: 195 U/L — ABNORMAL HIGH (ref 39–117)
BUN: 8 mg/dL (ref 6–23)
CO2: 28 mEq/L (ref 19–32)
Calcium: 9.7 mg/dL (ref 8.4–10.5)
Chloride: 100 mEq/L (ref 96–112)
Creatinine, Ser: 0.8 mg/dL (ref 0.40–1.20)
GFR: 70.43 mL/min (ref >60.00)
Glucose, Bld: 235 mg/dL — ABNORMAL HIGH (ref 70–99)
Potassium: 3.8 mEq/L (ref 3.5–5.1)
Sodium: 136 mEq/L (ref 135–145)
Total Bilirubin: 3 mg/dL — ABNORMAL HIGH (ref 0.2–1.2)
Total Protein: 6.7 g/dL (ref 6.0–8.3)

## 2019-04-24 LAB — BASIC METABOLIC PANEL
BUN: 8 mg/dL (ref 6–23)
CO2: 28 mEq/L (ref 19–32)
Calcium: 9.7 mg/dL (ref 8.4–10.5)
Chloride: 100 mEq/L (ref 96–112)
Creatinine, Ser: 0.8 mg/dL (ref 0.40–1.20)
GFR: 70.43 mL/min (ref >60.00)
Glucose, Bld: 235 mg/dL — ABNORMAL HIGH (ref 70–99)
Potassium: 3.8 mEq/L (ref 3.5–5.1)
Sodium: 136 mEq/L (ref 135–145)

## 2019-04-24 NOTE — Patient Instructions (Signed)
If you are age 72 or older, your body mass index should be between 23-30. Your Body mass index is 25.65 kg/m. If this is out of the aforementioned range listed, please consider follow up with your Primary Care Provider.  If you are age 61 or younger, your body mass index should be between 19-25. Your Body mass index is 25.65 kg/m. If this is out of the aformentioned range listed, please consider follow up with your Primary Care Provider.   Your provider has requested that you go to the basement level for lab. Please have these labs done on 06/06/2019 the day before your office visit.  Press "B" on the elevator. The lab is located at the first door on the left as you exit the elevator.  Please change the way you take the following medications.  Lasix 77m one tablet daily Aldactone 100 mg one table daily.  Thank you for choosing [facility] and [facility]  DOwens Loffler MD

## 2019-04-24 NOTE — Progress Notes (Signed)
Review of pertinent gastrointestinal problems: 1. Personal history of adenomatous colon polyps. Colonoscopy Dr. [name] 2012 August was normal except for 2 subcentimeter polyps, one was a tubular adenoma. She was recommended to have recall colonoscopy at five-year interval. She had also had previous colonoscopies with Dr. Lajoyce Corners and found to have adenomas as well.Colonoscopy Dr. [name] 07/2016 one subCM TA, recall at 5 years 2. Cirrhosis: likely from Fatty liver  Paracentesis 11/20174 liters, elevated SAAG, no SBP, cytology neg for malignancy  Most recent imaging:03/2017 [place]: cirrhosis, splenomegaly, NEW partial (non-occlusive) PV thrombus, Gallstones and new "mild dilation of intra and extrahepatic bile ducts"; MRI 03/2017 biliary findings probably from extensive periportal fibrosis, small gallstones in GB, non-occlusive PV thrombus appears chronic.US 10/2017: gallstones, thick GB, cirrhosis without masses in liver.04/2018 ultrasound cirrhosis without focal hepatic lesions.  Most recent AFP:03/2019 normal  MELD-NA;04/2018 labs : 10  Most recent EGD:07/2016 EGD: large varices, + portal gastropathy; started nadolol after discussion with cardiology(nadolol 58m once dialy); no recall necessary since on Nadolol.  Immunization for Hep A/B:Started 06/2016  PV thrombus 2018 imaging, non-occlusive and chronic per MR;hematology workup and I agreed; no plans for anticoag (given large varices).  Very large varices causing dysphagia despite 80 of nadolol daily.  EGD November 2020 placement of 14 variceal ligating bands.  This was complicated by significant chest pain and dysphagia afterwards requiring hospital admission.  Further complicated by bleeding from band ulcer site and eventual TIPS placement November 2020.   HPI: This is a very pleasant 73year old woman whom I last saw 1 week ago.  She is currently on Lasix 60 mg once daily and Aldactone 100 mg twice daily.  She is urinating quite  frequently and in fact has lost 20 pounds since her last visit here 1 week ago.  Her mouth is continually dry.  She has had no encephalopathy.  Previously she was bothered by a lot of loose stools.  She took a single Imodium and has not been bothered by loose stools since then.  She tells me that her right thigh is numb and it has been numb since her hospital stay, acute illnesses last month.  It is not lead to any instability in her gait.  The lower leg is not known.  Chief complaint is cirrhosis  Her weight is down 20 pounds since her last office visit here 1 week ago.  ROS: complete GI ROS as described in HPI, all other review negative.  Constitutional:  No unintentional weight loss   Past Medical History:  Diagnosis Date  . Allergy   . Arthritis    neck  . Cataract    bilateral - MD monitoring cataracts  . [place]   . Chronic kidney disease, stage I    DR OTTELIN  HX UTIS  . [place]   . Cramp of limb   . Diabetes mellitus   . Dysphagia, unspecified(787.20)   . Dysuria   . Epistaxis   . GERD (gastroesophageal reflux disease)   . Heart murmur    NO CARDIOLOGIST  DX FOR YEARS ASYMPTOMATIC  . Lumbago   . Neoplasm of uncertain behavior of skin   . Nonspecific elevation of levels of transaminase or lactic acid dehydrogenase (LDH)   . Osteoarthrosis, unspecified whether generalized or localized, unspecified site   . Other and unspecified hyperlipidemia    diet controlled  . Pain in joint, shoulder region   . Paresthesias 04/01/2015  . Postablative ovarian failure   . Trochanteric  bursitis of left hip 12/15/2015  . Type 2 diabetes mellitus without complication ([place])   . Unspecified essential hypertension    no meds    Past Surgical History:  Procedure Laterality Date  . BREAST BIOPSY    . CARDIAC CATHETERIZATION N/A 01/27/2016   Procedure: Left Heart Cath and Coronary Angiography;  Surgeon: Belva Crome, MD;  Location: [place] [facility];  Service: Cardiovascular;  Laterality: N/A;  . COLONOSCOPY  2012   Dariella Gillihan - polyp  . COLONOSCOPY WITH PROPOFOL N/A 07/07/2016   Procedure: COLONOSCOPY WITH PROPOFOL;  Surgeon: [name], MD;  Location: [facility];  Service: Endoscopy;  Laterality: N/A;  . CORONARY ARTERY BYPASS GRAFT N/A 01/28/2016   Procedure: CORONARY ARTERY BYPASS GRAFTING (CABG) x 3 USING RIGHT LEG GREATER SAPHENOUS VEIN GRAFT;  Surgeon: Melrose Nakayama, MD;  Location: [place];  Service: Open Heart Surgery;  Laterality: N/A;  . ENDOVEIN HARVEST OF GREATER SAPHENOUS VEIN Right 01/28/2016   Procedure: ENDOVEIN HARVEST OF GREATER SAPHENOUS VEIN;  Surgeon: Melrose Nakayama, MD;  Location: [place];  Service: Open Heart Surgery;  Laterality: Right;  . ESOPHAGEAL BANDING  03/28/2019   Procedure: ESOPHAGEAL BANDING;  Surgeon: [name], MD;  Location[facility];  Service: Endoscopy;;  . ESOPHAGEAL BANDING  04/07/2019   Procedure: ESOPHAGEAL BANDING;  Surgeon: Juanita Craver, MD;  Location[facility] ENDOSCOPY;  Service: Endoscopy;;  . ESOPHAGOGASTRODUODENOSCOPY N/A 04/07/2019   Procedure: ESOPHAGOGASTRODUODENOSCOPY (EGD);  Surgeon: Juanita Craver, MD;  Location: [facility] ENDOSCOPY;  Service: Endoscopy;  Laterality: N/A;  . ESOPHAGOGASTRODUODENOSCOPY (EGD) WITH PROPOFOL N/A 07/07/2016   Procedure: ESOPHAGOGASTRODUODENOSCOPY (EGD) WITH PROPOFOL;  Surgeon: [name], MD;  [facility];  Service: Endoscopy;  Laterality: N/A;  . ESOPHAGOGASTRODUODENOSCOPY (EGD) WITH PROPOFOL N/A 03/28/2019   Procedure: ESOPHAGOGASTRODUODENOSCOPY (EGD) WITH PROPOFOL;  Surgeon: [name], MD;  [facility];  Service: Endoscopy;  Laterality: N/A;  . HEMOSTASIS CLIP PLACEMENT  04/07/2019   Procedure: HEMOSTASIS CLIP PLACEMENT;  Surgeon: Juanita Craver, MD;  Location: [place] ENDOSCOPY;  Service: Endoscopy;;  . IR ANGIOGRAM SELECTIVE EACH ADDITIONAL VESSEL  04/08/2019  . IR EMBO ART  VEN HEMORR LYMPH EXTRAV  INC GUIDE  ROADMAPPING  04/08/2019  . IR PARACENTESIS  04/08/2019  . IR TIPS  04/08/2019  . MAXIMUM ACCESS (MAS)POSTERIOR LUMBAR INTERBODY FUSION (PLIF) 1 LEVEL Left 06/10/2015   Procedure: FOR MAXIMUM ACCESS (MAS) POSTERIOR LUMBAR INTERBODY FUSION (PLIF) LUMBAR THREE-FOUR EXTRAFORAMINAL MICRODISCECTOMY LUMBAR FIVE-SACRAL ONE LEFT;  Surgeon: Eustace Moore, MD;  Location: [place] NEURO ORS;  Service: Neurosurgery;  Laterality: Left;  . RADIOLOGY WITH ANESTHESIA N/A 04/08/2019   Procedure: RADIOLOGY WITH ANESTHESIA;  Surgeon: Radiologist, Medication, MD;  Location: [place];  Service: Radiology;  Laterality: N/A;  . SCLEROTHERAPY  04/07/2019   Procedure: SCLEROTHERAPY;  Surgeon: Juanita Craver, MD;  Location: [facility] END[facility]COPY;  Service: Endoscopy;;  . TEE WITHOUT CARDIOVERSION N/A 01/28/2016   Procedure: TRANSESOPHAGEAL ECHOCARDIOGRAM (TEE);  Surgeon: Melrose Nakayama, MD;  Location: [place];  Service: Open Heart Surgery;  Laterality: N/A;  . TUBAL LIGATION  1982   Dr Connye Burkitt  . UPPER GASTROINTESTINAL ENDOSCOPY    . VAGINAL HYSTERECTOMY  1997   Dr Rande Lawman    Current Outpatient Medications  Medication Sig Dispense Refill  . acetaminophen (TYLENOL) 500 MG tablet Take 500 mg by mouth at bedtime.     . Aromatic Inhalants (VICKS VAPOR IN) Vicks Vapor Rub apply small amount to outside of nose to help breathing    .  BD PEN NEEDLE NANO U/F 32G X 4 MM MISC USE THREE TIMES DAILY AS DIRECTED 100 each 6  . Biotin 10000 MCG TABS Take 10,000 mcg by mouth every morning.    . bisacodyl (DULCOLAX) 10 MG suppository Place 1 suppository (10 mg total) rectally daily as needed for moderate constipation. 12 suppository 0  . calcium carbonate (OS-CAL) 600 MG TABS Take 600 mg by mouth 2 (two) times daily with a meal.      . Cholecalciferol (VITAMIN D) 50 MCG (2000 UT) CAPS Take 2,000 Units by mouth daily.     . Cyanocobalamin (VITAMIN B 12 PO) Take 1,000 mcg by mouth daily.      [name] Kitchen ezetimibe (ZETIA) 10 MG tablet TAKE 1 TABLET(10 MG) BY MOUTH  DAILY 90 tablet 1  . furosemide (LASIX) 40 MG tablet Take 40 mg by mouth. **Pt is taking a total of 50m daily. **    . glucose blood test strip One Touch Ultra II strips. Use to test blood sugar three times daily. Dx: E11.65 300 each 3  . insulin detemir (LEVEMIR) 100 UNIT/ML injection Inject 0.2 mLs (20 Units total) into the skin at bedtime.    . Insulin Syringe-Needle U-100 (INSULIN SYRINGE 1CC/31GX5/16") 31G X 5/16" 1 ML MISC USE AS DIRECTED DAILY WITH LEVEMIR 100 each 2  . loratadine (CLARITIN) 10 MG tablet Take 10 mg by mouth daily as needed for allergies.    .[name] KitchenMAGNESIUM PO Take 500 mg by mouth 2 (two) times daily in the am and at bedtime..    . Multiple Vitamins-Minerals (MULTIVITAMIN WITH MINERALS) tablet Take 1 tablet by mouth daily.      .[name] KitchenNOVOLOG FLEXPEN 100 UNIT/ML FlexPen Inject 12 units under the skin every morning, 8 units at lunch and 12 units at supper 15 mL 3  . ondansetron (ZOFRAN) 4 MG tablet Take 1 tablet (4 mg total) by mouth every 6 (six) hours as needed for nausea. 20 tablet 0  . pantoprazole (PROTONIX) 40 MG tablet Take 1 tablet (40 mg total) by mouth 2 (two) times daily. 60 tablet 0  . Polyethyl Glycol-Propyl Glycol (SYSTANE OP) Place 1 drop into both eyes 2 (two) times daily.    . Probiotic Product (PROBIOTIC DAILY PO) Take 1 capsule by mouth daily. Digestive Advantage Probiotic    . spironolactone (ALDACTONE) 50 MG tablet Take 1 tablet (50 mg total) by mouth 2 (two) times daily. (Patient taking differently: Take 100 mg by mouth 2 (two) times daily. )     No current facility-administered medications for this visit.    Allergies as of 04/24/2019 - Review Complete 04/24/2019  Allergen Reaction Noted  . Kiwi extract Anaphylaxis 05/28/2015  . Tdap [tetanus-diphth-acell pertussis] Swelling and Other (See Comments) 03/27/2013  . Statins  05/08/2016  . Latex Itching, Dermatitis, and Rash 12/22/2010  . Tramadol Nausea And Vomiting 05/28/2015    Family History  Problem  Relation Age of Onset  . Lung cancer Father   . Arthritis Sister   . Arthritis Brother   . Heart disease Maternal Grandmother   . Heart disease Maternal Grandfather   . Heart disease Paternal Grandmother   . Heart disease Paternal Grandfather   . Breast cancer Mother   . Liver cancer Brother   . Breast cancer Maternal Aunt   . Breast cancer Paternal Aunt   . Colon cancer Neg Hx   . Esophageal cancer Neg Hx   . Rectal cancer Neg Hx   . Stomach cancer Neg Hx  Social History   Socioeconomic History  . Marital status: Married    Spouse name: Not on file  . Number of children: 6  . Years of education: Not on file  . Highest education level: Not on file  Occupational History  . Occupation: retired  Tobacco Use  . Smoking status: Never Smoker  . Smokeless tobacco: Never Used  Substance and Sexual Activity  . Alcohol use: No  . Drug use: No  . Sexual activity: Yes    Partners: Male    Birth control/protection: Post-menopausal, Surgical    Comment: Hysterectomy  Other Topics Concern  . Not on file  Social History Narrative  . Not on file   Social Determinants of Health   Financial Resource Strain:   . Difficulty of Paying Living Expenses: Not on file  Food Insecurity: No Food Insecurity  . Worried About Charity fundraiser in the Last Year: Never true  . Ran Out of Food in the Last Year: Never true  Transportation Needs: No Transportation Needs  . Lack of Transportation (Medical): No  . Lack of Transportation (Non-Medical): No  Physical Activity:   . Days of Exercise per Week: Not on file  . Minutes of Exercise per Session: Not on file  Stress:   . Feeling of Stress : Not on file  Social Connections:   . Frequency of Communication with Friends and Family: Not on file  . Frequency of Social Gatherings with Friends and Family: Not on file  . Attends Religious Services: Not on file  . Active Member of Clubs or Organizations: Not on file  . Attends Theatre manager Meetings: Not on file  . Marital Status: Not on file  Intimate Partner Violence:   . Fear of Current or Ex-Partner: Not on file  . Emotionally Abused: Not on file  . Physically Abused: Not on file  . Sexually Abused: Not on file     Physical Exam: BP 110/62 (BP Location: Left Arm, Patient Position: Sitting)   Pulse 62   Temp 98.2 F (36.8 C)   Ht 5' 3"  (1.6 m)   Wt 144 lb 12.8 oz (65.7 kg)   SpO2 96%   BMI 25.65 kg/m  Constitutional: Chronically ill-appearing quite frail. Psychiatric: alert and oriented x3 Abdomen: soft, nontender, nondistended, no obvious ascites, no peritoneal signs, normal bowel sounds No peripheral edema noted in lower extremities; trace lower extremity edema in both lower extremities, 1+ just above her right foot  Assessment and plan: 73 y.o. female with decompensated cirrhosis  Clinically she is starting to stabilize.  She is mobilizing a lot of fluid and in fact I am going to cut back on her diuretics.  Prior to her acute illness and eventual TIPS she was on 100 of Aldactone twice daily and 60 of Lasix daily.  She has been back on that dose for the past week or so and is probably over mobilizing.  She has lost 20 pounds in the last week.  I am going to cut her down to Aldactone 100 mg once daily and Lasix 40 mg once daily.  I am not sure why her right thigh is numb.  I think it is possibly related to hopefully transient nerve damage related to the positions that she was required to be in during her upper endoscopies or perhaps her TIPS procedure.  I expect that the numbness will gradually improve.  She has no signs of encephalopathy and her bowels have returned to normal.  She is no longer having diarrhea.  I am overall thrilled that she is starting to stabilize.  She had complete metabolic profile checked just prior to this visit and I will contact her with the results of that.  If all looks well she will return to see me in 4 weeks from  now.  Please see the "Patient Instructions" section for addition details about the plan.  Owens Loffler, MD [facility] 04/24/2019, 9:14 AM

## 2019-04-26 ENCOUNTER — Telehealth: Payer: Self-pay

## 2019-04-26 DIAGNOSIS — I81 Portal vein thrombosis: Secondary | ICD-10-CM | POA: Diagnosis not present

## 2019-04-26 DIAGNOSIS — K7581 Nonalcoholic steatohepatitis (NASH): Secondary | ICD-10-CM | POA: Diagnosis not present

## 2019-04-26 DIAGNOSIS — K3189 Other diseases of stomach and duodenum: Secondary | ICD-10-CM | POA: Diagnosis not present

## 2019-04-26 DIAGNOSIS — I2510 Atherosclerotic heart disease of native coronary artery without angina pectoris: Secondary | ICD-10-CM | POA: Diagnosis not present

## 2019-04-26 DIAGNOSIS — K7469 Other cirrhosis of liver: Secondary | ICD-10-CM | POA: Diagnosis not present

## 2019-04-26 DIAGNOSIS — I8501 Esophageal varices with bleeding: Secondary | ICD-10-CM | POA: Diagnosis not present

## 2019-04-26 NOTE — Telephone Encounter (Signed)
Protonix and omeprazole do the same thing.  No, as per med list, omeprazole was stopped and replaced with protonix.

## 2019-04-26 NOTE — Telephone Encounter (Signed)
Patient called and states she has been taking Protonix 2 times a day once in morning and once in evening. She would like to know if she should continue taking omeprazole.    Routing to provider. Please advise.

## 2019-04-29 NOTE — Telephone Encounter (Signed)
Tried calling patient, no answer, LMOM for patient to call office.

## 2019-04-29 NOTE — Telephone Encounter (Signed)
Discussed providers response, she verbalized understanding and had no further questions.

## 2019-05-01 ENCOUNTER — Other Ambulatory Visit: Payer: Self-pay | Admitting: *Deleted

## 2019-05-01 DIAGNOSIS — I8501 Esophageal varices with bleeding: Secondary | ICD-10-CM | POA: Diagnosis not present

## 2019-05-01 DIAGNOSIS — K3189 Other diseases of stomach and duodenum: Secondary | ICD-10-CM | POA: Diagnosis not present

## 2019-05-01 DIAGNOSIS — I2510 Atherosclerotic heart disease of native coronary artery without angina pectoris: Secondary | ICD-10-CM | POA: Diagnosis not present

## 2019-05-01 DIAGNOSIS — I81 Portal vein thrombosis: Secondary | ICD-10-CM | POA: Diagnosis not present

## 2019-05-01 DIAGNOSIS — K7581 Nonalcoholic steatohepatitis (NASH): Secondary | ICD-10-CM | POA: Diagnosis not present

## 2019-05-01 DIAGNOSIS — K7469 Other cirrhosis of liver: Secondary | ICD-10-CM | POA: Diagnosis not present

## 2019-05-01 NOTE — Patient Outreach (Signed)
[facility]) Care Management  05/01/2019  AMIRE GOSSEN [birth year] [id number]   Call placed to member to follow up on recovery from hospital admission.  She state she has just had company come in and this is not a good time to talk.  This care manager requested member call back when she is available.  If no call back, will follow up within the next 3-4 business days.  [name], [place], MSN [place] [phone]

## 2019-05-04 ENCOUNTER — Telehealth: Payer: Self-pay | Admitting: Physician Assistant

## 2019-05-04 ENCOUNTER — Other Ambulatory Visit: Payer: Self-pay | Admitting: Physician Assistant

## 2019-05-04 MED ORDER — LACTULOSE 10 GM/15ML PO SOLN: 20.0000 g | Freq: Three times a day (TID) | ORAL | Status: DC

## 2019-05-06 ENCOUNTER — Telehealth: Payer: Self-pay

## 2019-05-06 NOTE — Telephone Encounter (Signed)
Good morning, patient's son called wanting to follow up on messages left from over the weekend.

## 2019-05-06 NOTE — Telephone Encounter (Signed)
-----   Message from Timothy Lasso, RN sent at 04/01/2019 11:10 AM EST ----- She needs repeat EGD at [facility] in 5-6 weeks (on a Thursday) for variceal banding.  Thanks

## 2019-05-06 NOTE — Telephone Encounter (Signed)
I spoke with the pt and she tells me she did get the lactulose and states she feels better this morning.  She will call back if symptoms return.  Her appt was moved up from 1/29 to 1/19.  She is aware that she should call back if needed prior to that appt.

## 2019-05-06 NOTE — Telephone Encounter (Signed)
See alternate phone note

## 2019-05-06 NOTE — Telephone Encounter (Signed)
The pt son called to say that the pt is sleeping and he will call back once she wakes up and give an update on her condition.

## 2019-05-08 ENCOUNTER — Other Ambulatory Visit: Payer: Self-pay | Admitting: *Deleted

## 2019-05-08 NOTE — Patient Outreach (Signed)
[facility]) Care Management  05/08/2019  DOMONIQUE COTHRAN 12/15/1945 [id number]   Outreach attempt #2, unsuccessful.    Call placed to member to follow up on recovery of hospital admission and management of current health condition, no answer.  HIPAA compliant voice message left.  Will send unsuccessful outreach letter and follow up within the next 3-4 business days.  [name], [place], MSN [place] [phone]

## 2019-05-09 ENCOUNTER — Ambulatory Visit: Payer: Self-pay | Admitting: *Deleted

## 2019-05-13 ENCOUNTER — Ambulatory Visit (INDEPENDENT_AMBULATORY_CARE_PROVIDER_SITE_OTHER): Payer: Medicare Other | Admitting: Internal Medicine

## 2019-05-13 ENCOUNTER — Other Ambulatory Visit: Payer: Self-pay

## 2019-05-13 ENCOUNTER — Encounter: Payer: Self-pay | Admitting: Internal Medicine

## 2019-05-13 VITALS — BP 130/60 | HR 92 | Temp 97.5°F | Ht 63.0 in | Wt 136.0 lb

## 2019-05-13 DIAGNOSIS — E785 Hyperlipidemia, unspecified: Secondary | ICD-10-CM | POA: Diagnosis not present

## 2019-05-13 DIAGNOSIS — D696 Thrombocytopenia, unspecified: Secondary | ICD-10-CM | POA: Diagnosis not present

## 2019-05-13 DIAGNOSIS — M1990 Unspecified osteoarthritis, unspecified site: Secondary | ICD-10-CM | POA: Diagnosis not present

## 2019-05-13 DIAGNOSIS — I8501 Esophageal varices with bleeding: Secondary | ICD-10-CM | POA: Diagnosis not present

## 2019-05-13 DIAGNOSIS — Z951 Presence of aortocoronary bypass graft: Secondary | ICD-10-CM | POA: Diagnosis not present

## 2019-05-13 DIAGNOSIS — E1165 Type 2 diabetes mellitus with hyperglycemia: Secondary | ICD-10-CM | POA: Diagnosis not present

## 2019-05-13 DIAGNOSIS — I2510 Atherosclerotic heart disease of native coronary artery without angina pectoris: Secondary | ICD-10-CM | POA: Diagnosis not present

## 2019-05-13 DIAGNOSIS — K219 Gastro-esophageal reflux disease without esophagitis: Secondary | ICD-10-CM | POA: Diagnosis not present

## 2019-05-13 DIAGNOSIS — I509 Heart failure, unspecified: Secondary | ICD-10-CM | POA: Diagnosis not present

## 2019-05-13 DIAGNOSIS — N181 Chronic kidney disease, stage 1: Secondary | ICD-10-CM | POA: Diagnosis not present

## 2019-05-13 DIAGNOSIS — I130 Hypertensive heart and chronic kidney disease with heart failure and stage 1 through stage 4 chronic kidney disease, or unspecified chronic kidney disease: Secondary | ICD-10-CM | POA: Diagnosis not present

## 2019-05-13 DIAGNOSIS — K7460 Unspecified cirrhosis of liver: Secondary | ICD-10-CM

## 2019-05-13 DIAGNOSIS — E1136 Type 2 diabetes mellitus with diabetic cataract: Secondary | ICD-10-CM | POA: Diagnosis not present

## 2019-05-13 DIAGNOSIS — R188 Other ascites: Secondary | ICD-10-CM

## 2019-05-13 DIAGNOSIS — E1122 Type 2 diabetes mellitus with diabetic chronic kidney disease: Secondary | ICD-10-CM | POA: Diagnosis not present

## 2019-05-13 DIAGNOSIS — K7581 Nonalcoholic steatohepatitis (NASH): Secondary | ICD-10-CM | POA: Diagnosis not present

## 2019-05-13 DIAGNOSIS — K3189 Other diseases of stomach and duodenum: Secondary | ICD-10-CM | POA: Diagnosis not present

## 2019-05-13 DIAGNOSIS — R1310 Dysphagia, unspecified: Secondary | ICD-10-CM | POA: Diagnosis not present

## 2019-05-13 DIAGNOSIS — I81 Portal vein thrombosis: Secondary | ICD-10-CM | POA: Diagnosis not present

## 2019-05-13 DIAGNOSIS — G9340 Encephalopathy, unspecified: Secondary | ICD-10-CM

## 2019-05-13 DIAGNOSIS — K7469 Other cirrhosis of liver: Secondary | ICD-10-CM | POA: Diagnosis not present

## 2019-05-13 DIAGNOSIS — Z794 Long term (current) use of insulin: Secondary | ICD-10-CM | POA: Diagnosis not present

## 2019-05-13 NOTE — Progress Notes (Signed)
Location:  [facility] clinic  Provider: Dr.Tiffany Reed  Goals of Care:  Advanced Directives 04/19/2019  Does Patient Have a Medical Advance Directive? Yes  Type of Advance Directive [place]  Does patient want to make changes to medical advance directive? -  Would patient like information on creating a medical advance directive? No - Patient declined     Chief Complaint  Patient presents with  . Acute Visit    confusion, slurred speech since 05/03/2019, not sleeping    HPI: Patient is a 74 y.o. female seen today for medical management of chronic diseases.    Son present for visit today.   Confusion started around Christmas.Home health had just discontinued a week prior to confusion.  Family became worried and contacted Dr. [name], he prescribed lactulose. She started it on Saturday after Christmas. She is currently taking it three times a day. At the beginning of starting the medication, she would have diarrhea, but that has subsided.   Family also states she is very fatigued. She sleeps often and moves around the house slow. She has almost fallen a few times. Ambulates with a cane. Son states she has some good days and bad. She is extremely fatigued after a good day.   Slurred speech also started around Christmas.   Diet is poor. She may eat 1-2 meals a day. She drinks water, but cannot recall how many glasses a day. She has also lost her appetite for some of her favorite foods and drinks. Having trouble swallowing chicken, not fluids.   Son interested in information on living will.        Past Medical History:  Diagnosis Date  . Allergy   . Arthritis    neck  . Cataract    bilateral - MD monitoring cataracts  . CHF (congestive heart failure) ([place])   . Chronic kidney disease, stage I    DR OTTELIN  HX UTIS  . Cirrhosis ([place])   . Cramp of limb   . Diabetes mellitus   . Dysphagia, unspecified(787.20)   . Dysuria   . Epistaxis   . GERD  (gastroesophageal reflux disease)   . Heart murmur    NO CARDIOLOGIST  DX FOR YEARS ASYMPTOMATIC  . Lumbago   . Neoplasm of uncertain behavior of skin   . Nonspecific elevation of levels of transaminase or lactic acid dehydrogenase (LDH)   . Osteoarthrosis, unspecified whether generalized or localized, unspecified site   . Other and unspecified hyperlipidemia    diet controlled  . Pain in joint, shoulder region   . Paresthesias 04/01/2015  . Postablative ovarian failure   . Trochanteric bursitis of left hip 12/15/2015  . Type 2 diabetes mellitus without complication ([place])   . Unspecified essential hypertension    no meds    Past Surgical History:  Procedure Laterality Date  . BREAST BIOPSY    . CARDIAC CATHETERIZATION N/A 01/27/2016   Procedure: Left Heart Cath and Coronary Angiography;  Surgeon: Belva Crome, MD;  Location: [place] [facility] LAB;  Service: Cardiovascular;  Laterality: N/A;  . COLONOSCOPY  2012   [facility] - polyp  . COLONOSCOPY WITH PROPOFOL N/A 07/07/2016   Procedure: COLONOSCOPY WITH PROPOFOL;  Surgeon: [name], MD;  Location: [facility];  Service: Endoscopy;  Laterality: N/A;  . CORONARY ARTERY BYPASS GRAFT N/A 01/28/2016   Procedure: CORONARY ARTERY BYPASS GRAFTING (CABG) x 3 USING RIGHT LEG GREATER SAPHENOUS VEIN GRAFT;  Surgeon: Melrose Nakayama, MD;  Location:  [place] OR;  Service: Open Heart Surgery;  Laterality: N/A;  . ENDOVEIN HARVEST OF GREATER SAPHENOUS VEIN Right 01/28/2016   Procedure: ENDOVEIN HARVEST OF GREATER SAPHENOUS VEIN;  Surgeon: Melrose Nakayama, MD;  Location: [place];  Service: Open Heart Surgery;  Laterality: Right;  . ESOPHAGEAL BANDING  03/28/2019   Procedure: ESOPHAGEAL BANDING;  Surgeon: [name], MD;  Location: [facility];  Service: Endoscopy;;  . ESOPHAGEAL BANDING  04/07/2019   Procedure: ESOPHAGEAL BANDING;  Surgeon: Juanita Craver, MD;  Location: [facility] ENDOSCOPY;  Service: Endoscopy;;  . ESOPHAGOGASTRODUODENOSCOPY N/A  04/07/2019   Procedure: ESOPHAGOGASTRODUODENOSCOPY (EGD);  Surgeon: Juanita Craver, MD;  Location: [facility] ENDOSCOPY;  Service: Endoscopy;  Laterality: N/A;  . ESOPHAGOGASTRODUODENOSCOPY (EGD) WITH PROPOFOL N/A 07/07/2016   Procedure: ESOPHAGOGASTRODUODENOSCOPY (EGD) WITH PROPOFOL;  Surgeon: [name], MD;  [facility]: [facility];  Service: Endoscopy;  Laterality: N/A;  . ESOPHAGOGASTRODUODENOSCOPY (EGD) WITH PROPOFOL N/A 03/28/2019   Procedure: ESOPHAGOGASTRODUODENOSCOPY (EGD) WITH PROPOFOL;  Surgeon: [name], MD;  [facility]: [facility];  Service: Endoscopy;  Laterality: N/A;  . HEMOSTASIS CLIP PLACEMENT  04/07/2019   Procedure: HEMOSTASIS CLIP PLACEMENT;  Surgeon: Juanita Craver, MD;  Location: [place] ENDOSCOPY;  Service: Endoscopy;;  . IR ANGIOGRAM SELECTIVE EACH ADDITIONAL VESSEL  04/08/2019  . IR EMBO ART  VEN HEMORR LYMPH EXTRAV  INC GUIDE ROADMAPPING  04/08/2019  . IR PARACENTESIS  04/08/2019  . IR TIPS  04/08/2019  . MAXIMUM ACCESS (MAS)POSTERIOR LUMBAR INTERBODY FUSION (PLIF) 1 LEVEL Left 06/10/2015   Procedure: FOR MAXIMUM ACCESS (MAS) POSTERIOR LUMBAR INTERBODY FUSION (PLIF) LUMBAR THREE-FOUR EXTRAFORAMINAL MICRODISCECTOMY LUMBAR FIVE-SACRAL ONE LEFT;  Surgeon: Eustace Moore, MD;  Location: [place] [facility]O [facility];  Service: Neurosurgery;  Laterality: Left;  . RADIOLOGY WITH ANESTHESIA N/A 04/08/2019   Procedure: RADIOLOGY WITH ANESTHESIA;  Surgeon: Radiologist, Medication, MD;  Location: [place];  Service: Radiology;  Laterality: N/A;  . SCLEROTHERAPY  04/07/2019   Procedure: SCLEROTHERAPY;  Surgeon: Juanita Craver, MD;  Location: [facility] ENDOSCOPY;  Service: Endoscopy;;  . TEE WITHOUT CARDIOVERSION N/A 01/28/2016   Procedure: TRANSESOPHAGEAL ECHOCARDIOGRAM (TEE);  Surgeon: Melrose Nakayama, MD;  Location: [place];  Service: Open Heart Surgery;  Laterality: N/A;  . TUBAL LIGATION  1982   Dr Connye Burkitt  . UPPER GASTROINTESTINAL ENDOSCOPY    . VAGINAL HYSTERECTOMY  1997   Dr Rande Lawman    Allergies    Allergen Reactions  . Kiwi Extract Anaphylaxis  . Tdap [Tetanus-Diphth-Acell Pertussis] Swelling and Other (See Comments)    Swelling at injection site, gets very hot  . Statins     RHABDOMYOLYSIS  . Latex Itching, Dermatitis and Rash  . Tramadol Nausea And Vomiting    Outpatient Encounter Medications as of 05/13/2019  Medication Sig  . acetaminophen (TYLENOL) 500 MG tablet Take 500 mg by mouth at bedtime.   . Aromatic Inhalants (VICKS VAPOR IN) Vicks Vapor Rub apply small amount to outside of nose to help breathing  . BD PEN NEEDLE NANO U/F 32G X 4 MM MISC USE THREE TIMES DAILY AS DIRECTED  . Biotin 10000 MCG TABS Take 10,000 mcg by mouth every morning.  . bisacodyl (DULCOLAX) 10 MG suppository Place 1 suppository (10 mg total) rectally daily as needed for moderate constipation.  . calcium carbonate (OS-CAL) 600 MG TABS Take 600 mg by mouth 2 (two) times daily with a meal.    . Cholecalciferol (VITAMIN D) 50 MCG (2000 UT) CAPS Take 2,000 Units by mouth daily.   . Cyanocobalamin (VITAMIN B 12  PO) Take 1,000 mcg by mouth daily.    [name] Kitchen ezetimibe (ZETIA) 10 MG tablet TAKE 1 TABLET(10 MG) BY MOUTH DAILY  . furosemide (LASIX) 40 MG tablet Take 40 mg by mouth daily.   [name] Kitchen glucose blood test strip One Touch Ultra II strips. Use to test blood sugar three times daily. Dx: E11.65  . insulin detemir (LEVEMIR) 100 UNIT/ML injection Inject 0.2 mLs (20 Units total) into the skin at bedtime.  . Insulin Syringe-Needle U-100 (INSULIN SYRINGE 1CC/31GX5/16") 31G X 5/16" 1 ML MISC USE AS DIRECTED DAILY WITH LEVEMIR  . JARDIANCE 25 MG TABS tablet Take 25 mg by mouth daily.  [name] Kitchen lactulose (CHRONULAC) 10 GM/15ML solution Take 20 g by mouth 3 (three) times daily.  [name] Kitchen loratadine (CLARITIN) 10 MG tablet Take 10 mg by mouth daily as needed for allergies.  [name] Kitchen MAGNESIUM PO Take 500 mg by mouth 2 (two) times daily in the am and at bedtime..  . Multiple Vitamins-Minerals (MULTIVITAMIN WITH MINERALS) tablet Take 1 tablet by  mouth daily.    [name] Kitchen NOVOLOG FLEXPEN 100 UNIT/ML FlexPen Inject 12 units under the skin every morning, 8 units at lunch and 12 units at supper  . ondansetron (ZOFRAN) 4 MG tablet Take 1 tablet (4 mg total) by mouth every 6 (six) hours as needed for nausea.  . pantoprazole (PROTONIX) 40 MG tablet Take 1 tablet (40 mg total) by mouth 2 (two) times daily.  Vladimir Faster Glycol-Propyl Glycol (SYSTANE OP) Place 1 drop into both eyes 2 (two) times daily.  . Probiotic Product (PROBIOTIC DAILY PO) Take 1 capsule by mouth daily. Digestive Advantage Probiotic  . spironolactone (ALDACTONE) 50 MG tablet Take 1 tablet (50 mg total) by mouth 2 (two) times daily.  . [DISCONTINUED] lactulose (CHRONULAC) 10 GM/15ML solution 20 g    No facility-administered encounter medications on file as of 05/13/2019.    Review of Systems:  Review of Systems  Constitutional: Positive for activity change, appetite change and fatigue.  HENT: Positive for trouble swallowing. Negative for dental problem.   Respiratory: Negative for cough, shortness of breath and wheezing.   Cardiovascular: Negative for chest pain, palpitations and leg swelling.  Gastrointestinal: Negative for abdominal distention, abdominal pain, constipation, diarrhea and nausea.  Endocrine: Negative for polydipsia, polyphagia and polyuria.  Genitourinary: Negative for dysuria and hematuria.  Musculoskeletal:       Right leg pain  Skin: Positive for color change.       Jaundice, dry skin  Neurological: Positive for tremors. Negative for dizziness and headaches.  Psychiatric/Behavioral: Positive for dysphoric mood. Negative for sleep disturbance. The patient is not nervous/anxious.     Health Maintenance  Topic Date Due  . OPHTHALMOLOGY EXAM  08/24/2018  . HEMOGLOBIN A1C  09/29/2019  . FOOT EXAM  10/04/2019  . URINE MICROALBUMIN  01/31/2020  . MAMMOGRAM  09/25/2020  . COLONOSCOPY  07/07/2021  . INFLUENZA VACCINE  Completed  . DEXA SCAN  Completed  .  Hepatitis C Screening  Completed  . PNA vac Low Risk Adult  Completed  . TETANUS/TDAP  Discontinued    Physical Exam: Vitals:   05/13/19 1316  BP: 130/60  Pulse: 92  Temp: (!) 97.5 F (36.4 C)  TempSrc: Oral  SpO2: 98%  Weight: 136 lb (61.7 kg)  Height: 5' 3"  (1.6 m)   Body mass index is 24.09 kg/m. Physical Exam Vitals reviewed.  Constitutional:      Appearance: Normal appearance. She is normal weight.  HENT:  Head: Normocephalic.     Mouth/Throat:     Mouth: Mucous membranes are dry.  Eyes:   Cardiovascular:     Rate and Rhythm: Normal rate and regular rhythm.     Pulses: Normal pulses.     Heart sounds: Normal heart sounds.     Comments: Heart rate elevated from baseline Pulmonary:     Effort: Pulmonary effort is normal. No respiratory distress.     Breath sounds: Normal breath sounds. No wheezing.  Abdominal:     General: Bowel sounds are normal. There is no distension.     Palpations: Abdomen is soft.     Tenderness: There is no abdominal tenderness.  Musculoskeletal:     Right lower leg: No edema.     Left lower leg: No edema.  Skin:    General: Skin is dry.     Capillary Refill: Capillary refill takes more than 3 seconds.     Coloration: Skin is jaundiced.       Neurological:     General: No focal deficit present.     Mental Status: She is alert and oriented to person, place, and time. Mental status is at baseline.     Motor: Weakness present.  Psychiatric:        Mood and Affect: Mood normal. Affect is flat.        Speech: Speech is delayed.        Behavior: Behavior normal.        Thought Content: Thought content normal.        Judgment: Judgment normal.     Labs reviewed: Basic Metabolic Panel: Recent Labs    01/24/19 0907 04/11/19 0425 04/12/19 0303 04/15/19 1204 04/17/19 1105 04/24/19 0851  NA   < > 139 142 140 138 136  136  K   < > 3.6 3.3* 3.9 3.8 3.8  3.8  CL   < > 113* 113* 107 105 100  100  CO2   < > 18* 19* 25 27 28   28   GLUCOSE   < > 198* 84 105* 108* 235*  235*  BUN   < > 31* 20 11 11 8  8   CREATININE  --  1.44* 0.90 0.68 0.72 0.80  0.80  CALCIUM   < > 8.3* 8.6* 8.9 9.3 9.7  9.7  MG  --  1.8 1.6*  --   --   --    < > = values in this interval not displayed.   Liver Function Tests: Recent Labs    04/07/19 0350 04/15/19 1204 04/17/19 1105 04/24/19 0851  AST 36 138* 146* 59*  ALT 29 237* 187* 72*  ALKPHOS 71  --  256* 195*  BILITOT 1.3* 4.9* 4.5* 3.0*  PROT 6.0* 5.5* 6.0 6.7  ALBUMIN 2.4*  --  3.2* 3.4*   Recent Labs    04/01/19 1319 04/06/19 1300  LIPASE 20 23   No results for input(s): AMMONIA in the last 8760 hours. CBC: Recent Labs    04/03/19 0359 04/12/19 0303 04/15/19 1204 04/17/19 1105  WBC 4.5 8.3 11.0* 7.1  NEUTROABS 2.4  --  7,711 4.5  HGB 13.6 12.7 14.0 13.6  HCT 41.9 37.3 39.4 40.6  MCV 96.3 92.3 89.5 94.2  PLT 109* 56* 101* 113.0*   Lipid Panel: Recent Labs    05/24/18 0857  CHOL 147  HDL 53  LDLCALC 80  TRIG 64  CHOLHDL 2.8   Lab Results  Component Value Date  HGBA1C 8.4 (H) 04/01/2019    Procedures since last visit: No results found.  Assessment/Plan 1. Cirrhosis of liver with ascites, unspecified hepatic cirrhosis type ([place]) - followed by Dr. [name] -examination with symptoms of fatigue,weakness, yellow sclera, golden skin, and delayed responsiveness - suspect dehydration due to aggressive diuretic therapies within the past month - lactulose appears to be ineffective due to poor nutrition and hydration - will recheck ammonia level today - basic metabolic panel- today - CBC with differential/platelets- today - hepatic function panel- today - Protime- INR- today - encourage hydration with water and sugar-free electrolyte drinks like gatorade - encourage nutrition with small meals or adding sugar-free supplement shakes like Boost - use cane at all times when ambulating -  2. Uncontrolled type 2 diabetes mellitus with hyperglycemia  ([facility]) -poor dietary intake makes her high risk for hypoglycemia - continue current diabetic medications - CBC with differential/platelets- today  3. Encephalopathy acute - she presents with delayed responsiveness, answers questions appropriately - suspect poor dietary intake is making lactulose ineffective - continue lactulose 20 g three times daily - increase dietary intake and hydrate - ammonia level- today -hepatic function panel- today     Labs/tests ordered:  CBC with differential/platelets, basic metabolic panel, ammonia level, hepatic function panel, Protime- INR- today Next appt:  05/29/2019

## 2019-05-13 NOTE — Patient Instructions (Signed)
Increase your fluid intake.  It looks like you've gotten quite dehydrated now.  Protein supplement shakes are ok as is your body armour drink.    We will call your son with your lab results.

## 2019-05-14 ENCOUNTER — Other Ambulatory Visit: Payer: Self-pay | Admitting: *Deleted

## 2019-05-14 LAB — PROTIME-INR
INR: 1.1
Prothrombin Time: 11 s (ref 9.0–11.5)

## 2019-05-14 LAB — BASIC METABOLIC PANEL
BUN/Creatinine Ratio: 18 (calc) (ref 6–22)
BUN: 19 mg/dL (ref 7–25)
CO2: 24 mmol/L (ref 20–32)
Calcium: 11.1 mg/dL — ABNORMAL HIGH (ref 8.6–10.4)
Chloride: 96 mmol/L — ABNORMAL LOW (ref 98–110)
Creat: 1.05 mg/dL — ABNORMAL HIGH (ref 0.60–0.93)
Glucose, Bld: 269 mg/dL — ABNORMAL HIGH (ref 65–99)
Potassium: 4.5 mmol/L (ref 3.5–5.3)
Sodium: 133 mmol/L — ABNORMAL LOW (ref 135–146)

## 2019-05-14 LAB — AMMONIA: Ammonia: 102 umol/L — ABNORMAL HIGH (ref <=72)

## 2019-05-14 LAB — CBC WITH DIFFERENTIAL/PLATELET
Absolute Monocytes: 1154 cells/uL — ABNORMAL HIGH (ref 200–950)
Basophils Absolute: 62 cells/uL (ref 0–200)
Basophils Relative: 0.6 %
Eosinophils Absolute: 114 cells/uL (ref 15–500)
Eosinophils Relative: 1.1 %
HCT: 47.2 % — ABNORMAL HIGH (ref 35.0–45.0)
Hemoglobin: 16.1 g/dL — ABNORMAL HIGH (ref 11.7–15.5)
Lymphs Abs: 770 cells/uL — ABNORMAL LOW (ref 850–3900)
MCH: 31.6 pg (ref 27.0–33.0)
MCHC: 34.1 g/dL (ref 32.0–36.0)
MCV: 92.5 fL (ref 80.0–100.0)
MPV: 12.6 fL — ABNORMAL HIGH (ref 7.5–12.5)
Monocytes Relative: 11.1 %
Neutro Abs: 8299 cells/uL — ABNORMAL HIGH (ref 1500–7800)
Neutrophils Relative %: 79.8 %
Platelets: 151 10*3/uL (ref 140–400)
RBC: 5.1 10*6/uL (ref 3.80–5.10)
RDW: 16.2 % — ABNORMAL HIGH (ref 11.0–15.0)
Total Lymphocyte: 7.4 %
WBC: 10.4 10*3/uL (ref 3.8–10.8)

## 2019-05-14 LAB — HEPATIC FUNCTION PANEL
AG Ratio: 0.9 (calc) — ABNORMAL LOW (ref 1.0–2.5)
ALT: 41 U/L — ABNORMAL HIGH (ref 6–29)
AST: 58 U/L — ABNORMAL HIGH (ref 10–35)
Albumin: 3.6 g/dL (ref 3.6–5.1)
Alkaline phosphatase (APISO): 148 U/L (ref 37–153)
Bilirubin, Direct: 1.4 mg/dL — ABNORMAL HIGH (ref 0.0–0.2)
Globulin: 4.2 g/dL (calc) — ABNORMAL HIGH (ref 1.9–3.7)
Indirect Bilirubin: 2.1 mg/dL (calc) — ABNORMAL HIGH (ref 0.2–1.2)
Total Bilirubin: 3.5 mg/dL — ABNORMAL HIGH (ref 0.2–1.2)
Total Protein: 7.8 g/dL (ref 6.1–8.1)

## 2019-05-14 NOTE — Patient Outreach (Addendum)
[place] [facility]) Care Management  05/14/2019  [name] August 17, 1945 [id number]   Outreach attempt #3, unsuccessful.    Call placed to member to follow up on recovery of hospital admission and management of current health condition, no answer.  HIPAA compliant voice message left.  If no call back by 1/8, will close case due to inability to maintain contact.   Update:  Call received back from member.  Report she is not feeling well today, experiencing some nausea/vomiting as well as not being able to sleep, eat and having low energy.  State her MD is aware of these symptoms, was seen by her PCP yesterday.  Was told she was dehydrated to to drink more fluids, including gatorade and Ensure for protein supplements.  Also state she has not had a bowel movement in a few days despite taking Lactulose 3 times a day.  She has used her suppository once, advised to use again and if unable to be relieved contact MD office for alternative interventions.  She has appointment with GI on 1/19 and again with PCP on 1/25.  She will present to the ED if symptoms get worse, denies any urgent concerns at this time.  Will follow up with member within the next 2 weeks.    [facility] CM Care Plan Problem One     Most Recent Value  Care Plan Problem One  Risk for readmission related to bleeding varicies as evidenced by Recent hospitalizations  Role Documenting the Problem One  Care Management [place] for Problem One  Active  [facility] Long Term Goal   Member will not be readmitted to hospital within the next 31 days  [facility] Long Term Goal Start Date  04/19/19  [facility] Long Term Goal Met Date  05/14/19  [facility] CM Short Term Goal #1   Member will report taking medicatiosn as prescribed over the next 2 weeks  [facility] CM Short Term Goal #1 Start Date  04/19/19  [facility] CM Short Term Goal #1 Met Date  05/14/19  [facility] CM Short Term Goal #2   Member will report increased strength over the next 2 weeks  [facility] CM Short Term  Goal #2 Start Date  05/14/19  Interventions for Short Term Goal #2  Confirmed she is still active with PT, educated on importance of compliance with program in relation to increasing strength       [name], Therapist, sports, MSN [place] Manager [phone]

## 2019-05-14 NOTE — Addendum Note (Signed)
Addended by: Gayland Curry on: 05/14/2019 10:53 AM   Modules accepted: Orders

## 2019-05-14 NOTE — Progress Notes (Signed)
Ammonia is high.  Increase lactulose to four times daily.

## 2019-05-15 ENCOUNTER — Inpatient Hospital Stay (HOSPITAL_COMMUNITY)
Admission: EM | Admit: 2019-05-15 | Discharge: 2019-05-18 | DRG: 442 | Disposition: A | Discharge status: Home Health | Payer: Medicare Other | Attending: Internal Medicine | Admitting: Internal Medicine

## 2019-05-15 ENCOUNTER — Encounter (HOSPITAL_COMMUNITY): Payer: Self-pay | Admitting: Emergency Medicine

## 2019-05-15 ENCOUNTER — Emergency Department (HOSPITAL_COMMUNITY): Payer: Medicare Other

## 2019-05-15 ENCOUNTER — Other Ambulatory Visit: Payer: Self-pay

## 2019-05-15 ENCOUNTER — Other Ambulatory Visit: Payer: Self-pay | Admitting: Interventional Radiology

## 2019-05-15 DIAGNOSIS — N179 Acute kidney failure, unspecified: Secondary | ICD-10-CM | POA: Diagnosis present

## 2019-05-15 DIAGNOSIS — Z8261 Family history of arthritis: Secondary | ICD-10-CM

## 2019-05-15 DIAGNOSIS — W19XXXA Unspecified fall, initial encounter: Secondary | ICD-10-CM | POA: Diagnosis present

## 2019-05-15 DIAGNOSIS — Z90710 Acquired absence of both cervix and uterus: Secondary | ICD-10-CM | POA: Diagnosis not present

## 2019-05-15 DIAGNOSIS — E871 Hypo-osmolality and hyponatremia: Secondary | ICD-10-CM | POA: Diagnosis present

## 2019-05-15 DIAGNOSIS — Z20822 Contact with and (suspected) exposure to covid-19: Secondary | ICD-10-CM | POA: Diagnosis present

## 2019-05-15 DIAGNOSIS — Z8249 Family history of ischemic heart disease and other diseases of the circulatory system: Secondary | ICD-10-CM | POA: Diagnosis not present

## 2019-05-15 DIAGNOSIS — Z887 Allergy status to serum and vaccine status: Secondary | ICD-10-CM | POA: Diagnosis not present

## 2019-05-15 DIAGNOSIS — K7682 Hepatic encephalopathy: Secondary | ICD-10-CM

## 2019-05-15 DIAGNOSIS — N390 Urinary tract infection, site not specified: Secondary | ICD-10-CM | POA: Diagnosis not present

## 2019-05-15 DIAGNOSIS — I2510 Atherosclerotic heart disease of native coronary artery without angina pectoris: Secondary | ICD-10-CM | POA: Diagnosis present

## 2019-05-15 DIAGNOSIS — K7581 Nonalcoholic steatohepatitis (NASH): Secondary | ICD-10-CM

## 2019-05-15 DIAGNOSIS — R4182 Altered mental status, unspecified: Secondary | ICD-10-CM | POA: Diagnosis not present

## 2019-05-15 DIAGNOSIS — R404 Transient alteration of awareness: Secondary | ICD-10-CM | POA: Diagnosis not present

## 2019-05-15 DIAGNOSIS — K7469 Other cirrhosis of liver: Secondary | ICD-10-CM | POA: Diagnosis not present

## 2019-05-15 DIAGNOSIS — Z803 Family history of malignant neoplasm of breast: Secondary | ICD-10-CM | POA: Diagnosis not present

## 2019-05-15 DIAGNOSIS — R410 Disorientation, unspecified: Secondary | ICD-10-CM | POA: Diagnosis not present

## 2019-05-15 DIAGNOSIS — I129 Hypertensive chronic kidney disease with stage 1 through stage 4 chronic kidney disease, or unspecified chronic kidney disease: Secondary | ICD-10-CM | POA: Diagnosis present

## 2019-05-15 DIAGNOSIS — D696 Thrombocytopenia, unspecified: Secondary | ICD-10-CM | POA: Diagnosis present

## 2019-05-15 DIAGNOSIS — Z79899 Other long term (current) drug therapy: Secondary | ICD-10-CM

## 2019-05-15 DIAGNOSIS — G4700 Insomnia, unspecified: Secondary | ICD-10-CM | POA: Diagnosis present

## 2019-05-15 DIAGNOSIS — R531 Weakness: Secondary | ICD-10-CM

## 2019-05-15 DIAGNOSIS — Z951 Presence of aortocoronary bypass graft: Secondary | ICD-10-CM | POA: Diagnosis not present

## 2019-05-15 DIAGNOSIS — Z801 Family history of malignant neoplasm of trachea, bronchus and lung: Secondary | ICD-10-CM | POA: Diagnosis not present

## 2019-05-15 DIAGNOSIS — H9190 Unspecified hearing loss, unspecified ear: Secondary | ICD-10-CM | POA: Diagnosis present

## 2019-05-15 DIAGNOSIS — K7290 Hepatic failure, unspecified without coma: Secondary | ICD-10-CM | POA: Diagnosis not present

## 2019-05-15 DIAGNOSIS — Z9102 Food additives allergy status: Secondary | ICD-10-CM | POA: Diagnosis not present

## 2019-05-15 DIAGNOSIS — E869 Volume depletion, unspecified: Secondary | ICD-10-CM | POA: Diagnosis present

## 2019-05-15 DIAGNOSIS — N181 Chronic kidney disease, stage 1: Secondary | ICD-10-CM | POA: Diagnosis present

## 2019-05-15 DIAGNOSIS — E119 Type 2 diabetes mellitus without complications: Secondary | ICD-10-CM

## 2019-05-15 DIAGNOSIS — K7460 Unspecified cirrhosis of liver: Secondary | ICD-10-CM | POA: Diagnosis not present

## 2019-05-15 DIAGNOSIS — R05 Cough: Secondary | ICD-10-CM | POA: Diagnosis not present

## 2019-05-15 DIAGNOSIS — E1122 Type 2 diabetes mellitus with diabetic chronic kidney disease: Secondary | ICD-10-CM | POA: Diagnosis present

## 2019-05-15 DIAGNOSIS — K7200 Acute and subacute hepatic failure without coma: Secondary | ICD-10-CM | POA: Diagnosis present

## 2019-05-15 DIAGNOSIS — E860 Dehydration: Secondary | ICD-10-CM | POA: Diagnosis present

## 2019-05-15 DIAGNOSIS — K219 Gastro-esophageal reflux disease without esophagitis: Secondary | ICD-10-CM | POA: Diagnosis present

## 2019-05-15 DIAGNOSIS — Z91040 Latex allergy status: Secondary | ICD-10-CM | POA: Diagnosis not present

## 2019-05-15 DIAGNOSIS — R059 Cough, unspecified: Secondary | ICD-10-CM

## 2019-05-15 DIAGNOSIS — I9589 Other hypotension: Secondary | ICD-10-CM | POA: Diagnosis present

## 2019-05-15 DIAGNOSIS — Z981 Arthrodesis status: Secondary | ICD-10-CM

## 2019-05-15 DIAGNOSIS — I10 Essential (primary) hypertension: Secondary | ICD-10-CM | POA: Diagnosis not present

## 2019-05-15 DIAGNOSIS — Z794 Long term (current) use of insulin: Secondary | ICD-10-CM

## 2019-05-15 DIAGNOSIS — I8501 Esophageal varices with bleeding: Secondary | ICD-10-CM

## 2019-05-15 DIAGNOSIS — Z800 Family history of malignant neoplasm of digestive organs: Secondary | ICD-10-CM | POA: Diagnosis not present

## 2019-05-15 DIAGNOSIS — Z888 Allergy status to other drugs, medicaments and biological substances status: Secondary | ICD-10-CM

## 2019-05-15 DIAGNOSIS — R5381 Other malaise: Secondary | ICD-10-CM | POA: Diagnosis not present

## 2019-05-15 DIAGNOSIS — E1151 Type 2 diabetes mellitus with diabetic peripheral angiopathy without gangrene: Secondary | ICD-10-CM | POA: Diagnosis present

## 2019-05-15 LAB — SARS CORONAVIRUS 2 (TAT 6-24 HRS): SARS Coronavirus 2: NEGATIVE

## 2019-05-15 LAB — COMPREHENSIVE METABOLIC PANEL
ALT: 45 U/L — ABNORMAL HIGH (ref 0–44)
AST: 62 U/L — ABNORMAL HIGH (ref 15–41)
Albumin: 3 g/dL — ABNORMAL LOW (ref 3.5–5.0)
Alkaline Phosphatase: 127 U/L — ABNORMAL HIGH (ref 38–126)
Anion gap: 11 (ref 5–15)
BUN: 20 mg/dL (ref 8–23)
CO2: 23 mmol/L (ref 22–32)
Calcium: 10 mg/dL (ref 8.9–10.3)
Chloride: 97 mmol/L — ABNORMAL LOW (ref 98–111)
Creatinine, Ser: 1.13 mg/dL — ABNORMAL HIGH (ref 0.44–1.00)
Glucose, Bld: 138 mg/dL — ABNORMAL HIGH (ref 70–99)
Potassium: 4.6 mmol/L (ref 3.5–5.1)
Sodium: 131 mmol/L — ABNORMAL LOW (ref 135–145)
Total Bilirubin: 3.4 mg/dL — ABNORMAL HIGH (ref 0.3–1.2)
Total Protein: 7.2 g/dL (ref 6.5–8.1)

## 2019-05-15 LAB — PROTIME-INR
INR: 1.2 (ref 0.8–1.2)
Prothrombin Time: 14.6 seconds (ref 11.4–15.2)

## 2019-05-15 LAB — URINALYSIS, ROUTINE W REFLEX MICROSCOPIC
Bilirubin Urine: NEGATIVE
Glucose, UA: >=500 mg/dL — AB
Hgb urine dipstick: NEGATIVE
Ketones, ur: NEGATIVE mg/dL
Nitrite: NEGATIVE
Protein, ur: NEGATIVE mg/dL
Specific Gravity, Urine: 1.014 (ref 1.005–1.030)
WBC, UA: >50 WBC/hpf — ABNORMAL HIGH (ref 0–5)
pH: 6 (ref 5.0–8.0)

## 2019-05-15 LAB — CBC WITH DIFFERENTIAL/PLATELET
Abs Immature Granulocytes: 0.04 10*3/uL (ref 0.00–0.07)
Basophils Absolute: 0 10*3/uL (ref 0.0–0.1)
Basophils Relative: 1 %
Eosinophils Absolute: 0.1 10*3/uL (ref 0.0–0.5)
Eosinophils Relative: 1 %
HCT: 43.4 % (ref 36.0–46.0)
Hemoglobin: 14.9 g/dL (ref 12.0–15.0)
Immature Granulocytes: 1 %
Lymphocytes Relative: 8 %
Lymphs Abs: 0.6 10*3/uL — ABNORMAL LOW (ref 0.7–4.0)
MCH: 32.5 pg (ref 26.0–34.0)
MCHC: 34.3 g/dL (ref 30.0–36.0)
MCV: 94.6 fL (ref 80.0–100.0)
Monocytes Absolute: 1.1 10*3/uL — ABNORMAL HIGH (ref 0.1–1.0)
Monocytes Relative: 14 %
Neutro Abs: 5.9 10*3/uL (ref 1.7–7.7)
Neutrophils Relative %: 75 %
Platelets: 116 10*3/uL — ABNORMAL LOW (ref 150–400)
RBC: 4.59 MIL/uL (ref 3.87–5.11)
RDW: 16.9 % — ABNORMAL HIGH (ref 11.5–15.5)
WBC: 7.7 10*3/uL (ref 4.0–10.5)
nRBC: 0 % (ref 0.0–0.2)

## 2019-05-15 LAB — POCT I-STAT EG7
Acid-Base Excess: 5 mmol/L — ABNORMAL HIGH (ref 0.0–2.0)
Bicarbonate: 28.4 mmol/L — ABNORMAL HIGH (ref 20.0–28.0)
Calcium, Ion: 1.16 mmol/L (ref 1.15–1.40)
HCT: 44 % (ref 36.0–46.0)
Hemoglobin: 15 g/dL (ref 12.0–15.0)
O2 Saturation: 99 %
Potassium: 4.6 mmol/L (ref 3.5–5.1)
Sodium: 133 mmol/L — ABNORMAL LOW (ref 135–145)
TCO2: 29 mmol/L (ref 22–32)
pCO2, Ven: 35.5 mmHg — ABNORMAL LOW (ref 44.0–60.0)
pH, Ven: 7.511 — ABNORMAL HIGH (ref 7.250–7.430)
pO2, Ven: 105 mmHg — ABNORMAL HIGH (ref 32.0–45.0)

## 2019-05-15 LAB — CBG MONITORING, ED: Glucose-Capillary: 144 mg/dL — ABNORMAL HIGH (ref 70–99)

## 2019-05-15 LAB — AMMONIA: Ammonia: 111 umol/L — ABNORMAL HIGH (ref 9–35)

## 2019-05-15 LAB — LIPASE, BLOOD: Lipase: 29 U/L (ref 11–51)

## 2019-05-15 LAB — POC SARS CORONAVIRUS 2 AG -  ED: SARS Coronavirus 2 Ag: NEGATIVE

## 2019-05-15 MED ORDER — BISACODYL 10 MG RE SUPP: 10.0000 mg | Freq: Every day | RECTAL | Status: DC | PRN

## 2019-05-15 MED ORDER — SODIUM CHLORIDE 0.9 % IV SOLN
1.0000 g | INTRAVENOUS | Status: DC
  Administered 2019-05-15 – 2019-05-17 (×3): 1 g via INTRAVENOUS
  Filled 2019-05-15: qty 1
  Filled 2019-05-15: qty 10
  Filled 2019-05-15: qty 1
  Filled 2019-05-15: qty 10

## 2019-05-15 MED ORDER — MAGNESIUM OXIDE 400 (241.3 MG) MG PO TABS
400.0000 mg | ORAL_TABLET | Freq: Every day | ORAL | Status: DC
  Administered 2019-05-16 – 2019-05-18 (×3): 400 mg via ORAL
  Filled 2019-05-15 (×3): qty 1

## 2019-05-15 MED ORDER — INSULIN ASPART 100 UNIT/ML ~~LOC~~ SOLN
0.0000 [IU] | Freq: Three times a day (TID) | SUBCUTANEOUS | Status: DC
  Administered 2019-05-15 – 2019-05-16 (×2): 3 [IU] via SUBCUTANEOUS
  Administered 2019-05-16: 11 [IU] via SUBCUTANEOUS
  Administered 2019-05-17: 4 [IU] via SUBCUTANEOUS
  Administered 2019-05-17: 11 [IU] via SUBCUTANEOUS
  Administered 2019-05-17: 4 [IU] via SUBCUTANEOUS
  Administered 2019-05-18: 11 [IU] via SUBCUTANEOUS

## 2019-05-15 MED ORDER — SODIUM CHLORIDE 0.9 % IV SOLN: INTRAVENOUS | Status: DC

## 2019-05-15 MED ORDER — POLYVINYL ALCOHOL 1.4 % OP SOLN
Freq: Every day | OPHTHALMIC | Status: DC
  Administered 2019-05-17 – 2019-05-18 (×2): 1 [drp] via OPHTHALMIC
  Filled 2019-05-15: qty 15

## 2019-05-15 MED ORDER — LACTULOSE 10 GM/15ML PO SOLN
20.0000 g | Freq: Once | ORAL | Status: AC
  Administered 2019-05-15: 20 g via ORAL
  Filled 2019-05-15: qty 30

## 2019-05-15 MED ORDER — INSULIN DETEMIR 100 UNIT/ML ~~LOC~~ SOLN
15.0000 [IU] | Freq: Two times a day (BID) | SUBCUTANEOUS | Status: DC
  Administered 2019-05-16 – 2019-05-18 (×6): 15 [IU] via SUBCUTANEOUS
  Filled 2019-05-15 (×7): qty 0.15

## 2019-05-15 MED ORDER — RISAQUAD PO CAPS: ORAL_CAPSULE | Freq: Every day | ORAL | Status: DC

## 2019-05-15 MED ORDER — INSULIN ASPART 100 UNIT/ML ~~LOC~~ SOLN
0.0000 [IU] | Freq: Every day | SUBCUTANEOUS | Status: DC
  Administered 2019-05-16 – 2019-05-17 (×2): 2 [IU] via SUBCUTANEOUS

## 2019-05-15 MED ORDER — ACETAMINOPHEN 650 MG RE SUPP: 650.0000 mg | Freq: Four times a day (QID) | RECTAL | Status: DC | PRN

## 2019-05-15 MED ORDER — CALCIUM CARBONATE 1250 (500 CA) MG PO TABS
600.0000 mg | ORAL_TABLET | Freq: Every day | ORAL | Status: DC
  Filled 2019-05-15: qty 0.5

## 2019-05-15 MED ORDER — ONDANSETRON HCL 4 MG PO TABS
4.0000 mg | ORAL_TABLET | Freq: Four times a day (QID) | ORAL | Status: DC | PRN
  Filled 2019-05-15: qty 1

## 2019-05-15 MED ORDER — EZETIMIBE 10 MG PO TABS
10.0000 mg | ORAL_TABLET | Freq: Every day | ORAL | Status: DC
  Administered 2019-05-16 – 2019-05-18 (×3): 10 mg via ORAL
  Filled 2019-05-15 (×4): qty 1

## 2019-05-15 MED ORDER — SODIUM CHLORIDE 0.9 % IV BOLUS
750.0000 mL | Freq: Once | INTRAVENOUS | Status: AC
  Administered 2019-05-15: 750 mL via INTRAVENOUS

## 2019-05-15 MED ORDER — VITAMIN D 25 MCG (1000 UNIT) PO TABS
2000.0000 [IU] | ORAL_TABLET | Freq: Every day | ORAL | Status: DC
  Administered 2019-05-16 – 2019-05-18 (×3): 2000 [IU] via ORAL
  Filled 2019-05-15 (×4): qty 2

## 2019-05-15 MED ORDER — RIFAXIMIN 550 MG PO TABS
550.0000 mg | ORAL_TABLET | Freq: Two times a day (BID) | ORAL | Status: DC
  Administered 2019-05-15 – 2019-05-18 (×6): 550 mg via ORAL
  Filled 2019-05-15 (×9): qty 1

## 2019-05-15 MED ORDER — ADULT MULTIVITAMIN W/MINERALS CH
1.0000 | ORAL_TABLET | Freq: Every day | ORAL | Status: DC
  Administered 2019-05-16 – 2019-05-18 (×3): 1 via ORAL
  Filled 2019-05-15 (×3): qty 1

## 2019-05-15 MED ORDER — ACETAMINOPHEN 325 MG PO TABS: 650.0000 mg | ORAL_TABLET | Freq: Four times a day (QID) | ORAL | Status: DC | PRN

## 2019-05-15 MED ORDER — LACTULOSE 10 GM/15ML PO SOLN
20.0000 g | Freq: Four times a day (QID) | ORAL | Status: DC
  Administered 2019-05-15 – 2019-05-17 (×7): 20 g via ORAL
  Filled 2019-05-15 (×9): qty 30

## 2019-05-15 MED ORDER — PANTOPRAZOLE SODIUM 40 MG PO TBEC
40.0000 mg | DELAYED_RELEASE_TABLET | Freq: Every day | ORAL | Status: DC
  Administered 2019-05-15 – 2019-05-18 (×4): 40 mg via ORAL
  Filled 2019-05-15 (×4): qty 1

## 2019-05-15 NOTE — Consult Note (Addendum)
[facility] Consult: 12:37 PM 05/15/2019  LOS: 0 days    Referring Provider: Dr Deno Etienne in ED.    Primary Care Physician:  Gayland Curry, DO Primary Gastroenterologist:  Dr. Owens Loffler     Reason for Consultation: Worsening encephalopathy.   HPI: YARAH FUENTE is a 74 y.o. female.  PMH DM 2, insulin requiring.  CKD stage  1.  NASH cirrhosis diagnosed 04/2016.  Esophageal varices.  Portal hypertension. Ascites.  Paracentesis 03/2016, 4 L, no SBP, elevated SAAG, no malignancy on cytology.  07/2016 colonoscopy 1 subcentimeter tubular adenoma, recall planned 07/2021. Esophageal varices dating back to 07/2016 EGD. 03/28/2019 EGD for dysphagia.  Grade 3 varices in mid and distal esophagus, no signs of recent bleeding.  Moderate portal gastropathy, no gastric varices.  "Large esophageal varices despite 2 years of maximum dose nadolol likely contributing to dysphagia.  These were treated with placement of 14 ligating bands."   After this EGD/banding she had severe retrosternal pain, odynophagia treated with viscous lidocaine, Protonix, Carafate. 04/07/2019 EGD.  Dr Collene Mares for odynophagia.  Numerous esophageal ulcers at sites of previous bands, 1 actively bleeding was injected with epinephrine and 2 clips placed.  Another esophageal varix had stigmata of recent bleeding and was banded x1.  Portal hypertensive gastropathy CTAP 04/07/2019 revealed nonocclusive thrombus at confluence of SMV and splenic vein extending to main portal vein making TIPS placement and pressure gradient reduction challenging per Dr. Kathlene Cote. Fortunately underwent successful 04/09/2019 TIPS by Dr Loura Back.  At same time has 1.7 liter paracentesis.    Last GI office visit with Dr. [name] on 04/24/2019.  Taking Lasix 60 mg/day, Aldactone 100 mg  bid at that time.  Her previous problems with diarrhea had improved. C/o R thigh numbness.  Patient did not have history or display any symptoms suggesting encephalopathy.  Dr. [name] cut back her diuretics to Lasix 40/day, Aldactone 100 mg/day  Pt seen at [place] office yesterday, 05/13/2018.  She became confused, having slurred speech ~ Christmas time.  Dr. [name] had prescribed lactulose tid which she started on 12/27.  She was very fatigued.  05/13/19 labs with mild AKI at 19/1.0, sodium 133.  INR 1.2.  Hgb 16.1.  Glucose is 269.  Ammonia level 102.  PMD increased Lactulose to qid, decreased Lasix. On wellness call back yesterday there were reports of nausea vomiting, insomnia, continued fatigue.  He took a fall yesterday but did not sustain any head trauma.  It is unclear whether or not she had a bowel movement yesterday but it had been several days since she had a bowel movement despite lactulose and a rectal laxative administered by her husband.  Son reports she averages a bowel movement every 4 or 5 days. She was advised to come to the ED if symptoms progressed.  Transported by EMS to [facility] ED today with progressive altered mental status, obtunded. BP 123/51.  Heart rate in the 80s.  Excellent room air saturation. Labs show continuing hyponatremia, ammonia level 111, progression of AKI.  Slight increase INR of 1.2. CT head  nonacute shows mild small vessel disease and foci of arterial vascular calcification.  Mucosal thickening in several ethmoid air cells.  Orders in place to drop NG tube to allow for administration of lactulose.    Past Medical History:  Diagnosis Date  . Allergy   . Arthritis    neck  . Cataract    bilateral - MD monitoring cataracts  . CHF (congestive heart failure) ([place])   . Chronic kidney disease, stage I    DR OTTELIN  HX UTIS  . Cirrhosis ([place])   . Cramp of limb   . Diabetes mellitus   . Dysphagia, unspecified(787.20)   . Dysuria   . Epistaxis   . GERD  (gastroesophageal reflux disease)   . Heart murmur    NO CARDIOLOGIST  DX FOR YEARS ASYMPTOMATIC  . Lumbago   . Neoplasm of uncertain behavior of skin   . Nonspecific elevation of levels of transaminase or lactic acid dehydrogenase (LDH)   . Osteoarthrosis, unspecified whether generalized or localized, unspecified site   . Other and unspecified hyperlipidemia    diet controlled  . Pain in joint, shoulder region   . Paresthesias 04/01/2015  . Postablative ovarian failure   . Trochanteric bursitis of left hip 12/15/2015  . Type 2 diabetes mellitus without complication ([place])   . Unspecified essential hypertension    no meds    Past Surgical History:  Procedure Laterality Date  . BREAST BIOPSY    . CARDIAC CATHETERIZATION N/A 01/27/2016   Procedure: Left Heart Cath and Coronary Angiography;  Surgeon: Belva Crome, MD;  Location: [place] [facility];  Service: Cardiovascular;  Laterality: N/A;  . COLONOSCOPY  2012   [place] - polyp  . COLONOSCOPY WITH PROPOFOL N/A 07/07/2016   Procedure: COLONOSCOPY WITH PROPOFOL;  Surgeon: [name], MD;  Location: [facility] ENDOSCOPY;  Service: Endoscopy;  Laterality: N/A;  . CORONARY ARTERY BYPASS GRAFT N/A 01/28/2016   Procedure: CORONARY ARTERY BYPASS GRAFTING (CABG) x 3 USING RIGHT LEG GREATER SAPHENOUS VEIN GRAFT;  Surgeon: Melrose Nakayama, MD;  Location: [place];  Service: Open Heart Surgery;  Laterality: N/A;  . ENDOVEIN HARVEST OF GREATER SAPHENOUS VEIN Right 01/28/2016   Procedure: ENDOVEIN HARVEST OF GREATER SAPHENOUS VEIN;  Surgeon: Melrose Nakayama, MD;  Location: [place];  Service: Open Heart Surgery;  Laterality: Right;  . ESOPHAGEAL BANDING  03/28/2019   Procedure: ESOPHAGEAL BANDING;  Surgeon: [name], MD;  Location: [facility];  Service: Endoscopy;;  . ESOPHAGEAL BANDING  04/07/2019   Procedure: ESOPHAGEAL BANDING;  Surgeon: Juanita Craver, MD;  Location: [facility] ENDOSCOPY;  Service: Endoscopy;;  . ESOPHAGOGASTRODUODENOSCOPY N/A  04/07/2019   Procedure: ESOPHAGOGASTRODUODENOSCOPY (EGD);  Surgeon: Juanita Craver, MD;  Location: [facility] ENDOSCOPY;  Service: Endoscopy;  Laterality: N/A;  . ESOPHAGOGASTRODUODENOSCOPY (EGD) WITH PROPOFOL N/A 07/07/2016   Procedure: ESOPHAGOGASTRODUODENOSCOPY (EGD) WITH PROPOFOL;  Surgeon: [name], MD;  Location: [facility];  Service: Endoscopy;  Laterality: N/A;  . ESOPHAGOGASTRODUODENOSCOPY (EGD) WITH PROPOFOL N/A 03/28/2019   Procedure: ESOPHAGOGASTRODUODENOSCOPY (EGD) WITH PROPOFOL;  Surgeon: [name], MD;  Location: [facility];  Service: Endoscopy;  Laterality: N/A;  . HEMOSTASIS CLIP PLACEMENT  04/07/2019   Procedure: HEMOSTASIS CLIP PLACEMENT;  Surgeon: Juanita Craver, MD;  Location: [place] ENDOSCOPY;  Service: Endoscopy;;  . IR ANGIOGRAM SELECTIVE EACH ADDITIONAL VESSEL  04/08/2019  . IR EMBO ART  VEN HEMORR LYMPH EXTRAV  INC GUIDE ROADMAPPING  04/08/2019  . IR PARACENTESIS  04/08/2019  . IR TIPS  04/08/2019  .  MAXIMUM ACCESS (MAS)POSTERIOR LUMBAR INTERBODY FUSION (PLIF) 1 LEVEL Left 06/10/2015   Procedure: FOR MAXIMUM ACCESS (MAS) POSTERIOR LUMBAR INTERBODY FUSION (PLIF) LUMBAR THREE-FOUR EXTRAFORAMINAL MICRODISCECTOMY LUMBAR FIVE-SACRAL ONE LEFT;  Surgeon: Eustace Moore, MD;  Location: [place] NEURO ORS;  Service: Neurosurgery;  Laterality: Left;  . RADIOLOGY WITH ANESTHESIA N/A 04/08/2019   Procedure: RADIOLOGY WITH ANESTHESIA;  Surgeon: Radiologist, Medication, MD;  Location: [place];  Service: Radiology;  Laterality: N/A;  . SCLEROTHERAPY  04/07/2019   Procedure: SCLEROTHERAPY;  Surgeon: Juanita Craver, MD;  Location: [facility] ENDOSCOPY;  Service: Endoscopy;;  . TEE WITHOUT CARDIOVERSION N/A 01/28/2016   Procedure: TRANSESOPHAGEAL ECHOCARDIOGRAM (TEE);  Surgeon: Melrose Nakayama, MD;  Location: [place];  Service: Open Heart Surgery;  Laterality: N/A;  . TUBAL LIGATION  1982   Dr Connye Burkitt  . UPPER GASTROINTESTINAL ENDOSCOPY    . VAGINAL HYSTERECTOMY  1997   Dr Rande Lawman    Prior to Admission  medications   Medication Sig Start Date End Date Taking? Authorizing Provider  acetaminophen (TYLENOL) 500 MG tablet Take 500 mg by mouth at bedtime.     [provider]  Aromatic Inhalants (VICKS VAPOR IN) Vicks Vapor Rub apply small amount to outside of nose to help breathing    [provider]  BD PEN NEEDLE NANO U/F 32G X 4 MM MISC USE THREE TIMES DAILY AS DIRECTED 11/26/18   Reed, Tiffany L, DO  Biotin 10000 MCG TABS Take 10,000 mcg by mouth every morning.    [provider]  bisacodyl (DULCOLAX) 10 MG suppository Place 1 suppository (10 mg total) rectally daily as needed for moderate constipation. 04/12/19   Aline August, MD  calcium carbonate (OS-CAL) 600 MG TABS Take 600 mg by mouth 2 (two) times daily with a meal.      [provider]  Cholecalciferol (VITAMIN D) 50 MCG (2000 UT) CAPS Take 2,000 Units by mouth daily.     [provider]  Cyanocobalamin (VITAMIN B 12 PO) Take 1,000 mcg by mouth daily.      [provider]  ezetimibe (ZETIA) 10 MG tablet TAKE 1 TABLET(10 MG) BY MOUTH DAILY 12/26/18   Reed, Tiffany L, DO  furosemide (LASIX) 40 MG tablet Take 40 mg by mouth daily.     [provider]  glucose blood test strip One Touch Ultra II strips. Use to test blood sugar three times daily. Dx: E11.65 06/01/17   Reed, Tiffany L, DO  insulin detemir (LEVEMIR) 100 UNIT/ML injection Inject 0.2 mLs (20 Units total) into the skin at bedtime. 04/13/19   Aline August, MD  Insulin Syringe-Needle U-100 (INSULIN SYRINGE 1CC/31GX5/16") 31G X 5/16" 1 ML MISC USE AS DIRECTED DAILY WITH LEVEMIR 07/27/18   Reed, Tiffany L, DO  JARDIANCE 25 MG TABS tablet Take 25 mg by mouth daily. 04/30/19   [provider]  lactulose (CHRONULAC) 10 GM/15ML solution Take 20 g by mouth 3 (three) times daily. 05/04/19   [provider]  loratadine (CLARITIN) 10 MG tablet Take 10 mg by mouth daily as needed for allergies.    [provider]  MAGNESIUM PO Take 500 mg by mouth 2 (two) times daily in the am and at bedtime..    [provider]  Multiple Vitamins-Minerals (MULTIVITAMIN WITH MINERALS) tablet Take 1 tablet by mouth daily.      [provider]  NOVOLOG FLEXPEN 100 UNIT/ML FlexPen Inject 12 units under the skin every morning, 8 units at lunch and 12 units at  supper 02/19/19   Reed, Tiffany L, DO  ondansetron (ZOFRAN) 4 MG tablet Take 1 tablet (4 mg total) by mouth every 6 (six) hours as needed for nausea. 04/12/19   Aline August, MD  pantoprazole (PROTONIX) 40 MG tablet Take 1 tablet (40 mg total) by mouth 2 (two) times daily. 04/12/19   Aline August, MD  Polyethyl Glycol-Propyl Glycol (SYSTANE OP) Place 1 drop into both eyes 2 (two) times daily.    [provider]  Probiotic Product (PROBIOTIC DAILY PO) Take 1 capsule by mouth daily. Digestive Advantage Probiotic    [provider]  spironolactone (ALDACTONE) 50 MG tablet Take 1 tablet (50 mg total) by mouth 2 (two) times daily. 04/15/19   Reed, Tiffany L, DO    Scheduled Meds: . lactulose  20 g Oral Once   Infusions:  PRN Meds:    Allergies as of 05/15/2019 - Review Complete 05/15/2019  Allergen Reaction Noted  . Kiwi extract Anaphylaxis 05/28/2015  . Tdap [tetanus-diphth-acell pertussis] Swelling and Other (See Comments) 03/27/2013  . Statins  05/08/2016  . Latex Itching, Dermatitis, and Rash 12/22/2010  . Tramadol Nausea And Vomiting 05/28/2015    Family History  Problem Relation Age of Onset  . Lung cancer Father   . Arthritis Sister   . Arthritis Brother   . Heart disease Maternal Grandmother   . Heart disease Maternal Grandfather   . Heart disease Paternal Grandmother   . Heart disease Paternal Grandfather   . Breast cancer Mother   . Liver cancer Brother   . Breast cancer Maternal Aunt   . Breast cancer Paternal Aunt   . Colon cancer Neg Hx   . Esophageal cancer Neg Hx   . Rectal cancer Neg Hx   .  Stomach cancer Neg Hx     Social History   Socioeconomic History  . Marital status: Married    Spouse name: Not on file  . Number of children: 6  . Years of education: Not on file  . Highest education level: Not on file  Occupational History  . Occupation: retired  Tobacco Use  . Smoking status: Never Smoker  . Smokeless tobacco: Never Used  Substance and Sexual Activity  . Alcohol use: No  . Drug use: No  . Sexual activity: Yes    Partners: Male    Birth control/protection: Post-menopausal, Surgical    Comment: Hysterectomy  Other Topics Concern  . Not on file  Social History Narrative  . Not on file   Social Determinants of Health   Financial Resource Strain:   . Difficulty of Paying Living Expenses: Not on file  Food Insecurity: No Food Insecurity  . Worried About Charity fundraiser in the Last Year: Never true  . Ran Out of Food in the Last Year: Never true  Transportation Needs: No Transportation Needs  . Lack of Transportation (Medical): No  . Lack of Transportation (Non-Medical): No  Physical Activity:   . Days of Exercise per Week: Not on file  . Minutes of Exercise per Session: Not on file  Stress:   . Feeling of Stress : Not on file  Social Connections:   . Frequency of Communication with Friends and Family: Not on file  . Frequency of Social Gatherings with Friends and Family: Not on file  . Attends Religious Services: Not on file  . Active Member of Clubs or Organizations: Not on file  . Attends Archivist Meetings: Not on file  . Marital  Status: Not on file  Intimate Partner Violence:   . Fear of Current or Ex-Partner: Not on file  . Emotionally Abused: Not on file  . Physically Abused: Not on file  . Sexually Abused: Not on file    REVIEW OF SYSTEMS: Constitutional: Weakness, fatigue. ENT:  No nose bleeds Pulm: No shortness of breath.  No cough CV:  No palpitations, no LE edema.  No chest pain GU:  No hematuria, no frequency GI:  See HPI.  Generally poor appetite with recurrent dysphagia. Heme: No unusual bleeding.  Does have a small bruise on her left hip from her fall yesterday. Transfusions: No transfusions encountered on review of epic record. Neuro: Fusion as above.  Generalized weakness but no asymmetric weakness. Derm:  No itching, no rash or sores.  Endocrine:  No sweats or chills.  No polyuria or dysuria Immunization: Reviewed. Travel:  None .    PHYSICAL EXAM: Vital signs in last 24 hours: Vitals:   05/15/19 1045 05/15/19 1215  BP:  (!) 125/54  Pulse:    Resp:  20  Temp: 97.9 F (36.6 C)   SpO2:     Wt Readings from Last 3 Encounters:  05/13/19 61.7 kg  04/24/19 65.7 kg  04/17/19 74.8 kg    General: Frail, ill looking, comfortable WF.  Laying in Biomedical scientist in the ED. Head: No facial asymmetry or swelling.  No signs of head trauma. Eyes: Slight scleral icterus.  No conjunctival pallor Ears: Not hard of hearing. Nose: No congestion or discharge Mouth: Fair dentition.  Tongue midline.  Slightly dry oral mucosa. Neck: No JVD, no masses, no thyromegaly Lungs: Clear bilaterally.  No labored breathing or cough Heart: RRR.  No MRG.  S1, S2 present. Abdomen: Not distended, not tender.  Active bowel sounds.  No HSM, masses, bruits, hernias.   Rectal: Deferred Musc/Skeltl: No joint redness, swelling or gross deformity.  Small bruise on left lateral hip region. Extremities: No CCE. Neurologic: Able to tell me her name, Katharine Look (this is actually her middle name but she goes by this).  Able to tell me her son's name who is standing at the bedside.  Not able to give me her date of birth.  Laconic.  Follows commands.  Moves all 4 limbs, strength not tested.  Not able to hold her arms out for testing of asterixis.   Skin: And, possibly early jaundice. Tattoos: None seen Nodes: No cervical adenopathy. Psych: Cooperative, calm able to follow simple commands but difficulty with more complex  commands  Intake/Output from previous day: No intake/output data recorded. Intake/Output this shift: No intake/output data recorded.  LAB RESULTS: Recent Labs    05/13/19 1436 05/15/19 1042 05/15/19 1056  WBC 10.4 7.7  --   HGB 16.1* 14.9 15.0  HCT 47.2* 43.4 44.0  PLT 151 116*  --    BMET Lab Results  Component Value Date   NA 133 (L) 05/15/2019   NA 131 (L) 05/15/2019   NA 133 (L) 05/13/2019   K 4.6 05/15/2019   K 4.6 05/15/2019   K 4.5 05/13/2019   CL 97 (L) 05/15/2019   CL 96 (L) 05/13/2019   CL 100 04/24/2019   CL 100 04/24/2019   CO2 23 05/15/2019   CO2 24 05/13/2019   CO2 28 04/24/2019   CO2 28 04/24/2019   GLUCOSE 138 (H) 05/15/2019   GLUCOSE 269 (H) 05/13/2019   GLUCOSE 235 (H) 04/24/2019   GLUCOSE 235 (H) 04/24/2019   BUN 20 05/15/2019  BUN 19 05/13/2019   BUN 8 04/24/2019   BUN 8 04/24/2019   CREATININE 1.13 (H) 05/15/2019   CREATININE 1.05 (H) 05/13/2019   CREATININE 0.80 04/24/2019   CREATININE 0.80 04/24/2019   CALCIUM 10.0 05/15/2019   CALCIUM 11.1 (H) 05/13/2019   CALCIUM 9.7 04/24/2019   CALCIUM 9.7 04/24/2019   LFT Recent Labs    05/13/19 1436 05/15/19 1042  PROT 7.8 7.2  ALBUMIN  --  3.0*  AST 58* 62*  ALT 41* 45*  ALKPHOS  --  127*  BILITOT 3.5* 3.4*  BILIDIR 1.4*  --   IBILI 2.1*  --    PT/INR Lab Results  Component Value Date   INR 1.2 05/15/2019   INR 1.1 05/13/2019   INR 1.5 (H) 04/17/2019   Hepatitis Panel No results for input(s): HEPBSAG, HCVAB, HEPAIGM, HEPBIGM in the last 72 hours. C-Diff No components found for: CDIFF Lipase     Component Value Date/Time   LIPASE 29 05/15/2019 1042    Drugs of Abuse  No results found for: LABOPIA, COCAINSCRNUR, LABBENZ, AMPHETMU, THCU, LABBARB   RADIOLOGY STUDIES: CT Head Wo Contrast  Result Date: 05/15/2019 CLINICAL DATA:  Altered mental status.  Confusion. EXAM: CT HEAD WITHOUT CONTRAST TECHNIQUE: Contiguous axial images were obtained from the base of the skull  through the vertex without intravenous contrast. COMPARISON:  None. FINDINGS: Brain: The ventricles and sulci appear normal in size and contour. There is no intracranial mass, hemorrhage, extra-axial fluid collection, or midline shift. There is mild small vessel disease in the centra semiovale bilaterally. No acute appearing infarct is evident on this study. Vascular: There is no hyperdense vessel. There is calcification in each carotid siphon region. Skull: The bony calvarium appears intact. Sinuses/Orbits: There is mucosal thickening in several ethmoid air cells. Other visualized paranasal sinuses are clear. Orbits appear symmetric bilaterally. Other: Mastoid air cells are clear. IMPRESSION: Mild periventricular small vessel disease. No acute infarct. No mass or hemorrhage. There are foci of arterial vascular calcification. There is mucosal thickening in several ethmoid air cells. Electronically Signed   By: Lowella Grip III M.D.   On: 05/15/2019 11:37   DG Chest Portable 1 View  Result Date: 05/15/2019 CLINICAL DATA:  Altered mental status EXAM: PORTABLE CHEST 1 VIEW COMPARISON:  04/01/2019 FINDINGS: Prior CABG. Heart is normal size. Lungs are clear. No effusions. No acute bony abnormality. IMPRESSION: No active disease. Electronically Signed   By: Rolm Baptise M.D.   On: 05/15/2019 10:46     IMPRESSION:   *   Hepatic encephalopathy.  Has not managed to have significant output of stool despite despite lactulose.    *    Large esophageal varices.  Status post TIPS 04/09/2019.  *     Cirrhosis due to NASH   PLAN:     *    Agree with max lactulose dosing via tube.  Added rifaximin.    Azucena Freed  05/15/2019, 12:37 PM Phone [phone]   Attending physician's note   I have taken a history, examined the patient and reviewed the chart. I agree with the Advanced Practitioner's note, impression and recommendations.  74 year old female history of type 2 diabetes insulin-dependent,  decompensated [place] cirrhosis, esophageal variceal hemorrhage November 2020 s/p TIPS Meld 14  Per son, c/o dysuria 1-2 weeks ago but none recently  Patient admitted with altered mental status and confusion, ammonia 102 UA is dirty, hazy appearing with many bacteria and leukocytosis  Acute hepatic encephalopathy s/p TIPS  precipitated by likely urinary tract infection  She is alert and following some commands though unable to comprehend completely or participate in conversation We can hold off NG tube placement if able to swallow lactulose and tablets  No ascites or volume overload on exam  Increase lactulose dose to 20 g 4 times daily with goal 2-3 bowel movements, can titrate down if she develops diarrhea Continue Xifaxan twice daily  UTI : started Ceftriaxone , f/u culture and sensitivity to narrow antibiotic coverage  Raliegh Ip Denzil Magnuson , MD [phone]

## 2019-05-15 NOTE — H&P (Deleted)
[facility] History & Physical    [name] 05/15/2019 Sol Blazing MD   Chief Complaint: Confusion HPI: The patient is a 74 y.o. year-old w hx of HTN, IDDM, CAD sp CABG and non-alcoholic cirrhosis. She has esoph banding 09/32/35 complicated by abd pain/ dysphagia and hypotension and was admitted 11/23 for 2 days and Rx'd w/ IVF"s, holding diuretics, PPI/ carafate and she improved and dc'd on 11/25. She was readmitted on 11/28 for UGI bleeding. Had EGD 11/29 w/ injection of bleeding esoph ulcers and banding of varix x 1, however bleeding continued so pt went forTIPS procedure and coil embolization of her varices by IR on 11/30.  IV PPI/ octreotide were given. Bleeding subsided and pt was dc'd on 04/12/19.   Today pt brought to ED for gen'd weakness, inability to speak or walk, no focal weakness noted.  Here HR is 95- 102 in ED, BP's soft 101/46, RR 15- 20.  Labs showed Na 131, BUN 20 cr 1.1, alb 3.0, tbili 3.4, NH3 111, ast 62 and alt 45. The liver tests were not much different than recent testing including tibil and NH3 levels. Pt was given lactulose 20gm and rifaximin ordered by GI and we are called to admit patient.    Pt's family member is present and gives history. Says she has improved "a lot" since this morning.  Not sure what medications she takes, pt's husband and daughter are more involved.    Patient has no c/o at this time.     ROS - n/a        Past Medical History  Past Medical History:  Diagnosis Date  . Allergy   . Arthritis    neck  . Cataract    bilateral - MD monitoring cataracts  . CHF (congestive heart failure) ([place])   . Chronic kidney disease, stage I    DR OTTELIN  HX UTIS  . Cirrhosis ([place])   . Cramp of limb   . Diabetes mellitus   . Dysphagia, unspecified(787.20)   . Dysuria   . Epistaxis   . GERD (gastroesophageal reflux disease)   . Heart murmur    NO CARDIOLOGIST  DX FOR YEARS ASYMPTOMATIC  . Lumbago   . Neoplasm of  uncertain behavior of skin   . Nonspecific elevation of levels of transaminase or lactic acid dehydrogenase (LDH)   . Osteoarthrosis, unspecified whether generalized or localized, unspecified site   . Other and unspecified hyperlipidemia    diet controlled  . Pain in joint, shoulder region   . Paresthesias 04/01/2015  . Postablative ovarian failure   . Trochanteric bursitis of left hip 12/15/2015  . Type 2 diabetes mellitus without complication ([place])   . Unspecified essential hypertension    no meds   Past Surgical History  Past Surgical History:  Procedure Laterality Date  . BREAST BIOPSY    . CARDIAC CATHETERIZATION N/A 01/27/2016   Procedure: Left Heart Cath and Coronary Angiography;  Surgeon: Belva Crome, MD;  Location: [place] [facility];  Service: Cardiovascular;  Laterality: N/A;  . COLONOSCOPY  2012   Dr Lajoyce Corners.   . COLONOSCOPY WITH PROPOFOL N/A 07/07/2016   Procedure: COLONOSCOPY WITH PROPOFOL;  Surgeon: [name], MD;  Location: [facility];  Service: Endoscopy;  Laterality: N/A;  . CORONARY ARTERY BYPASS GRAFT N/A 01/28/2016   Procedure: CORONARY ARTERY BYPASS GRAFTING (CABG) x 3 USING RIGHT LEG GREATER SAPHENOUS VEIN GRAFT;  Surgeon: Melrose Nakayama, MD;  Location: [place];  Service: Open Heart Surgery;  Laterality: N/A;  . ENDOVEIN HARVEST OF GREATER SAPHENOUS VEIN Right 01/28/2016   Procedure: ENDOVEIN HARVEST OF GREATER SAPHENOUS VEIN;  Surgeon: Melrose Nakayama, MD;  Location: [place];  Service: Open Heart Surgery;  Laterality: Right;  . ESOPHAGEAL BANDING  03/28/2019   Procedure: ESOPHAGEAL BANDING;  Surgeon: [name], MD;  Location: [facility];  Service: Endoscopy;;  . ESOPHAGEAL BANDING  04/07/2019   Procedure: ESOPHAGEAL BANDING;  Surgeon: Juanita Craver, MD;  Location: [facility] ENDOSCOPY;  Service: Endoscopy;;  . ESOPHAGOGASTRODUODENOSCOPY N/A 04/07/2019   Procedure: ESOPHAGOGASTRODUODENOSCOPY (EGD);  Surgeon: Juanita Craver, MD;  Location: [facility] ENDOSCOPY;   Service: Endoscopy;  Laterality: N/A;  . ESOPHAGOGASTRODUODENOSCOPY (EGD) WITH PROPOFOL N/A 07/07/2016   Procedure: ESOPHAGOGASTRODUODENOSCOPY (EGD) WITH PROPOFOL;  Surgeon: [name], MD;  Location: [facility];  Service: Endoscopy;  Laterality: N/A;  . ESOPHAGOGASTRODUODENOSCOPY (EGD) WITH PROPOFOL N/A 03/28/2019   Procedure: ESOPHAGOGASTRODUODENOSCOPY (EGD) WITH PROPOFOL;  Surgeon: [name], MD;  Location: [facility];  Service: Endoscopy;  Laterality: N/A;  . HEMOSTASIS CLIP PLACEMENT  04/07/2019   Procedure: HEMOSTASIS CLIP PLACEMENT;  Surgeon: Juanita Craver, MD;  Location: [place] ENDOSCOPY;  Service: Endoscopy;;  . IR ANGIOGRAM SELECTIVE EACH ADDITIONAL VESSEL  04/08/2019  . IR EMBO ART  VEN HEMORR LYMPH EXTRAV  INC GUIDE ROADMAPPING  04/08/2019  . IR PARACENTESIS  04/08/2019  . IR TIPS  04/08/2019  . MAXIMUM ACCESS (MAS)POSTERIOR LUMBAR INTERBODY FUSION (PLIF) 1 LEVEL Left 06/10/2015   Procedure: FOR MAXIMUM ACCESS (MAS) POSTERIOR LUMBAR INTERBODY FUSION (PLIF) LUMBAR THREE-FOUR EXTRAFORAMINAL MICRODISCECTOMY LUMBAR FIVE-SACRAL ONE LEFT;  Surgeon: Eustace Moore, MD;  Location: [place] [facility];  Service: Neurosurgery;  Laterality: Left;  . RADIOLOGY WITH ANESTHESIA N/A 04/08/2019   Procedure: RADIOLOGY WITH ANESTHESIA;  Surgeon: Radiologist, Medication, MD;  Location: [place];  Service: Radiology;  Laterality: N/A;  . SCLEROTHERAPY  04/07/2019   Procedure: SCLEROTHERAPY;  Surgeon: Juanita Craver, MD;  Location: [facility] ENDOSCOPY;  Service: Endoscopy;;  . TEE WITHOUT CARDIOVERSION N/A 01/28/2016   Procedure: TRANSESOPHAGEAL ECHOCARDIOGRAM (TEE);  Surgeon: Melrose Nakayama, MD;  Location: [place];  Service: Open Heart Surgery;  Laterality: N/A;  . TUBAL LIGATION  1982   Dr Connye Burkitt  . UPPER GASTROINTESTINAL ENDOSCOPY    . VAGINAL HYSTERECTOMY  1997   Dr Rande Lawman   Family History  Family History  Problem Relation Age of Onset  . Lung cancer Father   . Arthritis Sister   . Arthritis Brother    . Heart disease Maternal Grandmother   . Heart disease Maternal Grandfather   . Heart disease Paternal Grandmother   . Heart disease Paternal Grandfather   . Breast cancer Mother   . Liver cancer Brother   . Breast cancer Maternal Aunt   . Breast cancer Paternal Aunt   . Colon cancer Neg Hx   . Esophageal cancer Neg Hx   . Rectal cancer Neg Hx   . Stomach cancer Neg Hx    Social History  reports that she has never smoked. She has never used smokeless tobacco. She reports that she does not drink alcohol or use drugs. Allergies  Allergies  Allergen Reactions  . Kiwi Extract Anaphylaxis  . Tdap [Tetanus-Diphth-Acell Pertussis] Swelling and Other (See Comments)    Swelling at injection site, gets very hot  . Statins     RHABDOMYOLYSIS  . Latex Itching, Dermatitis and Rash  . Tramadol Nausea And Vomiting   Home medications Prior to Admission medications  Medication Sig Start Date End Date Taking? Authorizing Provider  acetaminophen (TYLENOL) 500 MG tablet Take 500 mg by mouth at bedtime.     [provider]  Aromatic Inhalants (VICKS VAPOR IN) Vicks Vapor Rub apply small amount to outside of nose to help breathing    [provider]  BD PEN NEEDLE NANO U/F 32G X 4 MM MISC USE THREE TIMES DAILY AS DIRECTED 11/26/18   Reed, Tiffany L, DO  Biotin 10000 MCG TABS Take 10,000 mcg by mouth every morning.    [provider]  bisacodyl (DULCOLAX) 10 MG suppository Place 1 suppository (10 mg total) rectally daily as needed for moderate constipation. 04/12/19   Aline August, MD  calcium carbonate (OS-CAL) 600 MG TABS Take 600 mg by mouth 2 (two) times daily with a meal.      [provider]  Cholecalciferol (VITAMIN D) 50 MCG (2000 UT) CAPS Take 2,000 Units by mouth daily.     [provider]  Cyanocobalamin (VITAMIN B 12 PO) Take 1,000 mcg by mouth daily.      [provider]  ezetimibe (ZETIA) 10 MG tablet TAKE 1 TABLET(10 MG) BY MOUTH  DAILY 12/26/18   Reed, Tiffany L, DO  furosemide (LASIX) 40 MG tablet Take 40 mg by mouth daily.     [provider]  glucose blood test strip One Touch Ultra II strips. Use to test blood sugar three times daily. Dx: E11.65 06/01/17   Reed, Tiffany L, DO  insulin detemir (LEVEMIR) 100 UNIT/ML injection Inject 0.2 mLs (20 Units total) into the skin at bedtime. 04/13/19   Aline August, MD  Insulin Syringe-Needle U-100 (INSULIN SYRINGE 1CC/31GX5/16") 31G X 5/16" 1 ML MISC USE AS DIRECTED DAILY WITH LEVEMIR 07/27/18   Reed, Tiffany L, DO  JARDIANCE 25 MG TABS tablet Take 25 mg by mouth daily. 04/30/19   [provider]  lactulose (CHRONULAC) 10 GM/15ML solution Take 20 g by mouth 3 (three) times daily. 05/04/19   [provider]  loratadine (CLARITIN) 10 MG tablet Take 10 mg by mouth daily as needed for allergies.    [provider]  MAGNESIUM PO Take 500 mg by mouth 2 (two) times daily in the am and at bedtime..    [provider]  Multiple Vitamins-Minerals (MULTIVITAMIN WITH MINERALS) tablet Take 1 tablet by mouth daily.      [provider]  NOVOLOG FLEXPEN 100 UNIT/ML FlexPen Inject 12 units under the skin every morning, 8 units at lunch and 12 units at supper 02/19/19   Reed, Tiffany L, DO  ondansetron (ZOFRAN) 4 MG tablet Take 1 tablet (4 mg total) by mouth every 6 (six) hours as needed for nausea. 04/12/19   Aline August, MD  pantoprazole (PROTONIX) 40 MG tablet Take 1 tablet (40 mg total) by mouth 2 (two) times daily. 04/12/19   Aline August, MD  Polyethyl Glycol-Propyl Glycol (SYSTANE OP) Place 1 drop into both eyes 2 (two) times daily.    [provider]  Probiotic Product (PROBIOTIC DAILY PO) Take 1 capsule by mouth daily. Digestive Advantage Probiotic    [provider]  spironolactone (ALDACTONE) 50 MG tablet Take 1 tablet (50 mg total) by mouth 2 (two) times daily. 04/15/19   Gayland Curry, DO   Liver Function  Tests Recent Labs  Lab 05/13/19 1436 05/15/19 1042  AST 58* 62*  ALT 41* 45*  ALKPHOS  --  127*  BILITOT 3.5* 3.4*  PROT 7.8 7.2  ALBUMIN  --  3.0*   Recent Labs  Lab 05/15/19 1042  LIPASE 29   CBC Recent Labs  Lab 05/13/19 1436 05/15/19 1042 05/15/19 1056  WBC 10.4 7.7  --   NEUTROABS 8,299* 5.9  --   HGB 16.1* 14.9 15.0  HCT 47.2* 43.4 44.0  MCV 92.5 94.6  --   PLT 151 116*  --    Basic Metabolic Panel Recent Labs  Lab 05/13/19 1436 05/15/19 1042 05/15/19 1056  NA 133* 131* 133*  K 4.5 4.6 4.6  CL 96* 97*  --   CO2 24 23  --   GLUCOSE 269* 138*  --   BUN 19 20  --   CREATININE 1.05* 1.13*  --   CALCIUM 11.1* 10.0  --    Iron/TIBC/Ferritin/ %Sat    Component Value Date/Time   IRON 29 05/07/2016 1422   TIBC 427 05/07/2016 1422   FERRITIN 13 05/07/2016 1422   IRONPCTSAT 7 (L) 05/07/2016 1422    Vitals:   05/15/19 1018 05/15/19 1030 05/15/19 1045 05/15/19 1215  BP: (!) 123/51 (!) 101/46  (!) 125/54  Pulse: 89 83    Resp: 16 15  20   Temp: 97.8 F (36.6 C)  97.9 F (36.6 C)   TempSrc: Oral  Rectal   SpO2: 99% 98%      Exam Gen thin pleasant tired appearing WF, not in distress No rash, cyanosis or gangrene Sclera anicteric, throat clear  No jvd or bruits Chest clear bilat to bases no rales or wheezing RRR no MRG, bounding LV Abd soft ntnd no mass, no hsm or ascites noted GUpurewick in place MS no joint effusions or deformity Ext no LE or UE edema, no wounds or ulcers Neuro is alert, Ox 3 and had gen weakness, but nonfocal    Home meds:  - furosemide 40/ spironolactone 50 bid  - lactulose 20gm tid  - ezetimibe 10 qd  - insulin detemir 20u hs/ novolog 12u -8u-12u ac /jardiance 85m qd  - prn's/ vitamins/ supplements     Assessment/ Plan: 1. General weakness - unable to ambulate, speak. Has had poor po intake, not sure if taking her home lactulose.  Prob combination of dehydration / hepatic enceph.  Looks better already after  lactulose here.  Will start IVF's as well. Hold diuretics for now 2. Cirrhosis/ hepatic enceph - home lactulose resumed by GI and rifaximin ordered which is new. apprec GI consult 3. Recent GI bleed/ esoph ulcers/ esoph varices - recent variceal banding 11/19, then admit for GIB w/ esoph ulcers and 1 varix treated 11/29 , then had TIPS and variceal embolization 11/30.   4. IDDM - use SSI here for now and give some of detemir dose 5. Dehydration - could use some IVF"s, NS at 75 cc/hr for now w/ 750cc bolus 6. CAD sp CABG      RKelly Splinter MD   [facility] 05/15/2019, 1:10 PM

## 2019-05-15 NOTE — ED Notes (Signed)
Family at bedside. 

## 2019-05-15 NOTE — ED Notes (Signed)
Verified clear tray ordered.

## 2019-05-15 NOTE — ED Notes (Addendum)
Pt up to restroom with assistance. No BM noted. Son at bedside.

## 2019-05-15 NOTE — ED Notes (Signed)
Assisted pt to the RR. She urinated, but did not have a BM. Assisted pt back to the bed. Placed brief under pt and replaced purewick.

## 2019-05-15 NOTE — ED Notes (Signed)
Patient denies pain and is resting comfortably.  

## 2019-05-15 NOTE — ED Triage Notes (Signed)
Pt arrives to ED where family reported altered mental status "since this morning". Ems does not know last seen normal. Per ems pt has a history of elevated ammonia level with confusion. Pt is alert to voice but gives me the name [name] and will not answer any other questions or follow commands.

## 2019-05-15 NOTE — ED Provider Notes (Signed)
[facility] EMERGENCY DEPARTMENT Provider Note   CSN: [id number] Arrival date & time: 05/15/19  1009     History Chief Complaint  Patient presents with  . Altered Mental Status    [name] is a 74 y.o. female presenting for evaluation of AMS.   Level V caveat due to ams.  Per son, pt was acting normal 2 days ago. She was ambulatory without assistance and abel to hold a conversation (baseline). Yesterday she became gradually more confused. Last night she was "up and down all night." today she was more confused and couldn't walk, so EMS was called. Pt has a h/o hepatic encephalopathy, son states sxs are similar but more severe this time. He reports abd appears bloated when compared to baseline. Pt was seen by Dr 2 days ago, lasix dose decreased and lactulose increased. She had a suppository that improved her mental status yesterday. H/o cirrhosis (not alcoholic). Recent TIPS procedure.  Follows with Dr. [name] with [facility].  Son denies recent fevers, cough, n/v, or abnormal urination.   additional history obtained from chart review. H/o chd, ckd, cirrhosis, dysphagia, gerd, DM, HTN.   HPI     Past Medical History:  Diagnosis Date  . Allergy   . Arthritis    neck  . Cataract    bilateral - MD monitoring cataracts  . CHF (congestive heart failure) ([place])   . Chronic kidney disease, stage I    DR OTTELIN  HX UTIS  . Cirrhosis ([place])   . Cramp of limb   . Diabetes mellitus   . Dysphagia, unspecified(787.20)   . Dysuria   . Epistaxis   . GERD (gastroesophageal reflux disease)   . Heart murmur    NO CARDIOLOGIST  DX FOR YEARS ASYMPTOMATIC  . Lumbago   . Neoplasm of uncertain behavior of skin   . Nonspecific elevation of levels of transaminase or lactic acid dehydrogenase (LDH)   . Osteoarthrosis, unspecified whether generalized or localized, unspecified site   . Other and unspecified hyperlipidemia    diet controlled  . Pain in joint, shoulder  region   . Paresthesias 04/01/2015  . Postablative ovarian failure   . Trochanteric bursitis of left hip 12/15/2015  . Type 2 diabetes mellitus without complication ([place])   . Unspecified essential hypertension    no meds    Patient Active Problem List   Diagnosis Date Noted  . Bleeding esophageal varices ([place])   . UGI bleed   . Infarction of spleen 04/06/2019  . Odynophagia 04/01/2019  . Dysphagia   . Hepatoma ([place]) 01/31/2019  . Coronary artery disease of native artery of native heart with stable angina pectoris ([place]) 01/31/2019  . Thrombocytopenia ([place]) 09/25/2017  . Left forearm pain 05/29/2017  . Portal vein thrombosis 04/22/2017  . Skin cancer of nose 08/23/2016  . History of colonic polyps   . Benign neoplasm of rectum   . Esophageal varices without bleeding ([place])   . Portal hypertensive gastropathy ([place])   . Leg cramps 05/18/2016  . Routine lab draw 05/13/2016  . Iron deficiency anemia 05/09/2016  . Statin-induced rhabdomyolysis 05/07/2016  . Liver cirrhosis ([place]) 04/05/2016  . PVC's (premature ventricular contractions) 04/05/2016  . CAD (coronary artery disease) 02/16/2016  . S/P CABG x 3 02/02/2016  . History of non-ST elevation myocardial infarction (NSTEMI) 01/27/2016  . DM (diabetes mellitus), type 2, uncontrolled ([place]) 12/15/2015  . Trochanteric bursitis of left hip 12/15/2015  . S/P lumbar spinal fusion 06/10/2015  .  Paresthesia 04/01/2015  . Osteoporosis 03/31/2015  . Hearing loss 06/25/2014  . Insomnia 06/25/2014  . Hair thinning 06/25/2014  . Muscle spasm 10/08/2013  . Biceps tendon rupture, proximal 11/24/2012  . Hyperlipemia   . GERD (gastroesophageal reflux disease)   . DM (diabetes mellitus) ([place])   . Essential hypertension     Past Surgical History:  Procedure Laterality Date  . BREAST BIOPSY    . CARDIAC CATHETERIZATION N/A 01/27/2016   Procedure: Left Heart Cath and Coronary Angiography;  Surgeon: Belva Crome, MD;  Location: [facility];  Service: Cardiovascular;  Laterality: N/A;  . COLONOSCOPY  2012   [facility] - polyp  . COLONOSCOPY WITH PROPOFOL N/A 07/07/2016   Procedure: COLONOSCOPY WITH PROPOFOL;  Surgeon: [name], MD;  Location: [facility];  Service: Endoscopy;  Laterality: N/A;  . CORONARY ARTERY BYPASS GRAFT N/A 01/28/2016   Procedure: CORONARY ARTERY BYPASS GRAFTING (CABG) x 3 USING RIGHT LEG GREATER SAPHENOUS VEIN GRAFT;  Surgeon: Melrose Nakayama, MD;  Location: [facility];  Service: Open Heart Surgery;  Laterality: N/A;  . ENDOVEIN HARVEST OF GREATER SAPHENOUS VEIN Right 01/28/2016   Procedure: ENDOVEIN HARVEST OF GREATER SAPHENOUS VEIN;  Surgeon: Melrose Nakayama, MD;  Location: [facility];  Service: Open Heart Surgery;  Laterality: Right;  . ESOPHAGEAL BANDING  03/28/2019   Procedure: ESOPHAGEAL BANDING;  Surgeon: [name], MD;  Location: [facility];  Service: Endoscopy;;  . ESOPHAGEAL BANDING  04/07/2019   Procedure: ESOPHAGEAL BANDING;  Surgeon: Juanita Craver, MD;  Location: [facility] ENDOSCOPY;  Service: Endoscopy;;  . ESOPHAGOGASTRODUODENOSCOPY N/A 04/07/2019   Procedure: ESOPHAGOGASTRODUODENOSCOPY (EGD);  Surgeon: Juanita Craver, MD;  Location: [facility] ENDOSCOPY;  Service: Endoscopy;  Laterality: N/A;  . ESOPHAGOGASTRODUODENOSCOPY (EGD) WITH PROPOFOL N/A 07/07/2016   Procedure: ESOPHAGOGASTRODUODENOSCOPY (EGD) WITH PROPOFOL;  Surgeon: [name], MD;  Location: [facility];  Service: Endoscopy;  Laterality: N/A;  . ESOPHAGOGASTRODUODENOSCOPY (EGD) WITH PROPOFOL N/A 03/28/2019   Procedure: ESOPHAGOGASTRODUODENOSCOPY (EGD) WITH PROPOFOL;  Surgeon: [name], MD;  Location: [facility];  Service: Endoscopy;  Laterality: N/A;  . HEMOSTASIS CLIP PLACEMENT  04/07/2019   Procedure: HEMOSTASIS CLIP PLACEMENT;  Surgeon: Juanita Craver, MD;  Location: [facility] ENDOSCOPY;  Service: Endoscopy;;  . IR ANGIOGRAM SELECTIVE EACH ADDITIONAL VESSEL  04/08/2019  . IR EMBO ART  VEN HEMORR LYMPH EXTRAV  INC GUIDE  ROADMAPPING  04/08/2019  . IR PARACENTESIS  04/08/2019  . IR TIPS  04/08/2019  . MAXIMUM ACCESS (MAS)POSTERIOR LUMBAR INTERBODY FUSION (PLIF) 1 LEVEL Left 06/10/2015   Procedure: FOR MAXIMUM ACCESS (MAS) POSTERIOR LUMBAR INTERBODY FUSION (PLIF) LUMBAR THREE-FOUR EXTRAFORAMINAL MICRODISCECTOMY LUMBAR FIVE-SACRAL ONE LEFT;  Surgeon: Eustace Moore, MD;  Location: [facility]URO ORS;  Service: Neurosurgery;  Laterality: Left;  . RADIOLOGY WITH ANESTHESIA N/A 04/08/2019   Procedure: RADIOLOGY WITH ANESTHESIA;  Surgeon: Radiologist, Medication, MD;  Location: [facility];  Service: Radiology;  Laterality: N/A;  . SCLEROTHERAPY  04/07/2019   Procedure: SCLEROTHERAPY;  Surgeon: Juanita Craver, MD;  Location: [facility] ENDOSCOPY;  Service: Endoscopy;;  . TEE WITHOUT CARDIOVERSION N/A 01/28/2016   Procedure: TRANSESOPHAGEAL ECHOCARDIOGRAM (TEE);  Surgeon: Melrose Nakayama, MD;  Location: [facility];  Service: Open Heart Surgery;  Laterality: N/A;  . TUBAL LIGATION  1982   Dr Connye Burkitt  . UPPER GASTROINTESTINAL ENDOSCOPY    . VAGINAL HYSTERECTOMY  1997   Dr Rande Lawman     OB History   No obstetric history on file.     Family History  Problem  Relation Age of Onset  . Lung cancer Father   . Arthritis Sister   . Arthritis Brother   . Heart disease Maternal Grandmother   . Heart disease Maternal Grandfather   . Heart disease Paternal Grandmother   . Heart disease Paternal Grandfather   . Breast cancer Mother   . Liver cancer Brother   . Breast cancer Maternal Aunt   . Breast cancer Paternal Aunt   . Colon cancer Neg Hx   . Esophageal cancer Neg Hx   . Rectal cancer Neg Hx   . Stomach cancer Neg Hx     Social History   Tobacco Use  . Smoking status: Never Smoker  . Smokeless tobacco: Never Used  Substance Use Topics  . Alcohol use: No  . Drug use: No    Home Medications Prior to Admission medications   Medication Sig Start Date End Date Taking? Authorizing Provider  acetaminophen (TYLENOL) 500 MG tablet Take  500 mg by mouth at bedtime.     [provider]  Aromatic Inhalants (VICKS VAPOR IN) Vicks Vapor Rub apply small amount to outside of nose to help breathing    [provider]  BD PEN NEEDLE NANO U/F 32G X 4 MM MISC USE THREE TIMES DAILY AS DIRECTED 11/26/18   Reed, Tiffany L, DO  Biotin 10000 MCG TABS Take 10,000 mcg by mouth every morning.    [provider]  bisacodyl (DULCOLAX) 10 MG suppository Place 1 suppository (10 mg total) rectally daily as needed for moderate constipation. 04/12/19   Aline August, MD  calcium carbonate (OS-CAL) 600 MG TABS Take 600 mg by mouth 2 (two) times daily with a meal.      [provider]  Cholecalciferol (VITAMIN D) 50 MCG (2000 UT) CAPS Take 2,000 Units by mouth daily.     [provider]  Cyanocobalamin (VITAMIN B 12 PO) Take 1,000 mcg by mouth daily.      [provider]  ezetimibe (ZETIA) 10 MG tablet TAKE 1 TABLET(10 MG) BY MOUTH DAILY 12/26/18   Reed, Tiffany L, DO  furosemide (LASIX) 40 MG tablet Take 40 mg by mouth daily.     [provider]  glucose blood test strip One Touch Ultra II strips. Use to test blood sugar three times daily. Dx: E11.65 06/01/17   Reed, Tiffany L, DO  insulin detemir (LEVEMIR) 100 UNIT/ML injection Inject 0.2 mLs (20 Units total) into the skin at bedtime. 04/13/19   Aline August, MD  Insulin Syringe-Needle U-100 (INSULIN SYRINGE 1CC/31GX5/16") 31G X 5/16" 1 ML MISC USE AS DIRECTED DAILY WITH LEVEMIR 07/27/18   Reed, Tiffany L, DO  JARDIANCE 25 MG TABS tablet Take 25 mg by mouth daily. 04/30/19   [provider]  lactulose (CHRONULAC) 10 GM/15ML solution Take 20 g by mouth 3 (three) times daily. 05/04/19   [provider]  loratadine (CLARITIN) 10 MG tablet Take 10 mg by mouth daily as needed for allergies.    [provider]  MAGNESIUM PO Take 500 mg by mouth 2 (two) times daily in the am and at bedtime..    [provider]  Multiple  Vitamins-Minerals (MULTIVITAMIN WITH MINERALS) tablet Take 1 tablet by mouth daily.      [provider]  NOVOLOG FLEXPEN 100 UNIT/ML FlexPen Inject 12 units under the skin every morning, 8 units at lunch and 12 units at supper 02/19/19   Reed, Tiffany L, DO  ondansetron (ZOFRAN) 4 MG tablet Take 1  tablet (4 mg total) by mouth every 6 (six) hours as needed for nausea. 04/12/19   Aline August, MD  pantoprazole (PROTONIX) 40 MG tablet Take 1 tablet (40 mg total) by mouth 2 (two) times daily. 04/12/19   Aline August, MD  Polyethyl Glycol-Propyl Glycol (SYSTANE OP) Place 1 drop into both eyes 2 (two) times daily.    [provider]  Probiotic Product (PROBIOTIC DAILY PO) Take 1 capsule by mouth daily. Digestive Advantage Probiotic    [provider]  spironolactone (ALDACTONE) 50 MG tablet Take 1 tablet (50 mg total) by mouth 2 (two) times daily. 04/15/19   Reed, Tiffany L, DO    Allergies    Kiwi extract, Tdap [tetanus-diphth-acell pertussis], Statins, Latex, and Tramadol  Review of Systems   Review of Systems  Unable to perform ROS: Mental status change    Physical Exam Updated Vital Signs BP (!) 125/54   Pulse 83   Temp 97.9 F (36.6 C) (Rectal)   Resp 20   SpO2 98%   Physical Exam Vitals and nursing note reviewed.  Constitutional:      General: She is not in acute distress.    Comments: Elderly female who is confused  HENT:     Head: Normocephalic and atraumatic.  Eyes:     Extraocular Movements: Extraocular movements intact.     Conjunctiva/sclera: Conjunctivae normal.     Pupils: Pupils are equal, round, and reactive to light.  Cardiovascular:     Rate and Rhythm: Normal rate and regular rhythm.     Pulses: Normal pulses.  Pulmonary:     Effort: Pulmonary effort is normal. No respiratory distress.     Breath sounds: Normal breath sounds. No wheezing.     Comments: Clear lung sounds Abdominal:     General: There is no distension.      Palpations: Abdomen is soft. There is no mass.     Tenderness: There is no abdominal tenderness. There is no rebound.     Comments: No obvious tenderness. No masses. No obvious distention or bloating.   Musculoskeletal:        General: Normal range of motion.     Cervical back: Normal range of motion and neck supple.     Right lower leg: No edema.     Left lower leg: No edema.  Skin:    General: Skin is warm and dry.     Capillary Refill: Capillary refill takes less than 2 seconds.  Neurological:     GCS: GCS eye subscore is 4. GCS verbal subscore is 2. GCS motor subscore is 5.     Comments: Exam limited due to mental status     ED Results / Procedures / Treatments   Labs (all labs ordered are listed, but only abnormal results are displayed) Labs Reviewed  CBC WITH DIFFERENTIAL/PLATELET - Abnormal; Notable for the following components:      Result Value   RDW 16.9 (*)    Platelets 116 (*)    Lymphs Abs 0.6 (*)    Monocytes Absolute 1.1 (*)    All other components within normal limits  COMPREHENSIVE METABOLIC PANEL - Abnormal; Notable for the following components:   Sodium 131 (*)    Chloride 97 (*)    Glucose, Bld 138 (*)    Creatinine, Ser 1.13 (*)    Albumin 3.0 (*)    AST 62 (*)    ALT 45 (*)    Alkaline Phosphatase 127 (*)  Total Bilirubin 3.4 (*)    All other components within normal limits  AMMONIA - Abnormal; Notable for the following components:   Ammonia 111 (*)    All other components within normal limits  POCT I-STAT EG7 - Abnormal; Notable for the following components:   pH, Ven 7.511 (*)    pCO2, Ven 35.5 (*)    pO2, Ven 105.0 (*)    Bicarbonate 28.4 (*)    Acid-Base Excess 5.0 (*)    Sodium 133 (*)    All other components within normal limits  SARS CORONAVIRUS 2 (TAT 6-24 HRS)  LIPASE, BLOOD  PROTIME-INR  URINALYSIS, ROUTINE W REFLEX MICROSCOPIC  POC SARS CORONAVIRUS 2 AG -  ED    EKG EKG Interpretation  Date/Time:  Wednesday May 15 2019  10:24:29 EST Ventricular Rate:  89 PR Interval:    QRS Duration: 100 QT Interval:  393 QTC Calculation: 479 R Axis:   -17 Text Interpretation: Sinus rhythm Borderline left axis deviation Probable anteroseptal infarct, recent No significant change since last tracing Confirmed by Deno Etienne [id number]) on 05/15/2019 10:33:06 AM   Radiology CT Head Wo Contrast  Result Date: 05/15/2019 CLINICAL DATA:  Altered mental status.  Confusion. EXAM: CT HEAD WITHOUT CONTRAST TECHNIQUE: Contiguous axial images were obtained from the base of the skull through the vertex without intravenous contrast. COMPARISON:  None. FINDINGS: Brain: The ventricles and sulci appear normal in size and contour. There is no intracranial mass, hemorrhage, extra-axial fluid collection, or midline shift. There is mild small vessel disease in the centra semiovale bilaterally. No acute appearing infarct is evident on this study. Vascular: There is no hyperdense vessel. There is calcification in each carotid siphon region. Skull: The bony calvarium appears intact. Sinuses/Orbits: There is mucosal thickening in several ethmoid air cells. Other visualized paranasal sinuses are clear. Orbits appear symmetric bilaterally. Other: Mastoid air cells are clear. IMPRESSION: Mild periventricular small vessel disease. No acute infarct. No mass or hemorrhage. There are foci of arterial vascular calcification. There is mucosal thickening in several ethmoid air cells. Electronically Signed   By: Lowella Grip III M.D.   On: 05/15/2019 11:37   DG Chest Portable 1 View  Result Date: 05/15/2019 CLINICAL DATA:  Altered mental status EXAM: PORTABLE CHEST 1 VIEW COMPARISON:  04/01/2019 FINDINGS: Prior CABG. Heart is normal size. Lungs are clear. No effusions. No acute bony abnormality. IMPRESSION: No active disease. Electronically Signed   By: Rolm Baptise M.D.   On: 05/15/2019 10:46    Procedures .Critical Care Performed by: Franchot Heidelberg,  PA-C Authorized by: Franchot Heidelberg, PA-C   Critical care provider statement:    Critical care time (minutes):  40   Critical care time was exclusive of:  Separately billable procedures and treating other patients and teaching time   Critical care was necessary to treat or prevent imminent or life-threatening deterioration of the following conditions:  CNS failure or compromise   Critical care was time spent personally by me on the following activities:  Blood draw for specimens, development of treatment plan with patient or surrogate, discussions with consultants, evaluation of patient's response to treatment, examination of patient, obtaining history from patient or surrogate, ordering and performing treatments and interventions, ordering and review of laboratory studies, ordering and review of radiographic studies, pulse oximetry, re-evaluation of patient's condition and review of old charts   I assumed direction of critical care for this patient from another provider in my specialty: no   Comments:  Pt with AMS concerning for hepatic encephalopathy requiring frequent reassessment and admission to the hospital.    (including critical care time)  Medications Ordered in ED Medications  lactulose (CHRONULAC) 10 GM/15ML solution 20 g (has no administration in time range)    ED Course  I have reviewed the triage vital signs and the nursing notes.  Pertinent labs & imaging results that were available during my care of the patient were reviewed by me and considered in my medical decision making (see chart for details).    MDM Rules/Calculators/A&P                      Pt presenting for evaluation of AMS. physical exam shows pt who is extremely confused. localizes pain, not following commands. Vitals stable. H/o similar, likely hepatic encephalopathy. consider gradual onset, doubt cva. Will obtain labs, cxr, ct head, and urine.   Labs show slight increase in ammonia at 111. LFTs  elevated but similar to previous. Ct head negative for acute findings. cxr viewed and interpreted by me, no pna, pnx, effusion, or cardiomegaly.   On reassessment, pt is still confused, but able to follow commands slightly. Will consult with GI and admit to medicine.   Will give lactulose for sx improvement. Due to mental status, will place NG to give medicine.   Discussed with Sandi Carne, PA-C form [facility], they will consult.   Discussed with Dr. Jonnie Finner from [facility] hospitalist service, pt to be admitted.   RN states pt was able to swallow water. Will trial lactulose PO instead of via NG.   Final Clinical Impression(s) / ED Diagnoses Final diagnoses:  Hepatic encephalopathy [facility])    Rx / DC Orders ED Discharge Orders    None       Franchot Heidelberg, PA-C 05/15/19 [place], [place], DO 05/15/19 1318

## 2019-05-16 DIAGNOSIS — Z8261 Family history of arthritis: Secondary | ICD-10-CM | POA: Diagnosis not present

## 2019-05-16 DIAGNOSIS — Z9102 Food additives allergy status: Secondary | ICD-10-CM | POA: Diagnosis not present

## 2019-05-16 DIAGNOSIS — I9589 Other hypotension: Secondary | ICD-10-CM | POA: Diagnosis present

## 2019-05-16 DIAGNOSIS — Z888 Allergy status to other drugs, medicaments and biological substances status: Secondary | ICD-10-CM | POA: Diagnosis not present

## 2019-05-16 DIAGNOSIS — R05 Cough: Secondary | ICD-10-CM | POA: Diagnosis not present

## 2019-05-16 DIAGNOSIS — K7290 Hepatic failure, unspecified without coma: Secondary | ICD-10-CM | POA: Diagnosis present

## 2019-05-16 DIAGNOSIS — Z90710 Acquired absence of both cervix and uterus: Secondary | ICD-10-CM | POA: Diagnosis not present

## 2019-05-16 DIAGNOSIS — E119 Type 2 diabetes mellitus without complications: Secondary | ICD-10-CM

## 2019-05-16 DIAGNOSIS — N179 Acute kidney failure, unspecified: Secondary | ICD-10-CM | POA: Diagnosis present

## 2019-05-16 DIAGNOSIS — R531 Weakness: Secondary | ICD-10-CM | POA: Diagnosis not present

## 2019-05-16 DIAGNOSIS — K7200 Acute and subacute hepatic failure without coma: Secondary | ICD-10-CM | POA: Diagnosis present

## 2019-05-16 DIAGNOSIS — K7460 Unspecified cirrhosis of liver: Secondary | ICD-10-CM | POA: Diagnosis present

## 2019-05-16 DIAGNOSIS — E1122 Type 2 diabetes mellitus with diabetic chronic kidney disease: Secondary | ICD-10-CM | POA: Diagnosis present

## 2019-05-16 DIAGNOSIS — N390 Urinary tract infection, site not specified: Secondary | ICD-10-CM | POA: Diagnosis present

## 2019-05-16 DIAGNOSIS — W19XXXA Unspecified fall, initial encounter: Secondary | ICD-10-CM | POA: Diagnosis present

## 2019-05-16 DIAGNOSIS — Z887 Allergy status to serum and vaccine status: Secondary | ICD-10-CM | POA: Diagnosis not present

## 2019-05-16 DIAGNOSIS — R410 Disorientation, unspecified: Secondary | ICD-10-CM | POA: Diagnosis not present

## 2019-05-16 DIAGNOSIS — Z801 Family history of malignant neoplasm of trachea, bronchus and lung: Secondary | ICD-10-CM | POA: Diagnosis not present

## 2019-05-16 DIAGNOSIS — N181 Chronic kidney disease, stage 1: Secondary | ICD-10-CM | POA: Diagnosis present

## 2019-05-16 DIAGNOSIS — Z20822 Contact with and (suspected) exposure to covid-19: Secondary | ICD-10-CM | POA: Diagnosis present

## 2019-05-16 DIAGNOSIS — E869 Volume depletion, unspecified: Secondary | ICD-10-CM | POA: Diagnosis present

## 2019-05-16 DIAGNOSIS — Z803 Family history of malignant neoplasm of breast: Secondary | ICD-10-CM | POA: Diagnosis not present

## 2019-05-16 DIAGNOSIS — R188 Other ascites: Secondary | ICD-10-CM

## 2019-05-16 DIAGNOSIS — Z794 Long term (current) use of insulin: Secondary | ICD-10-CM

## 2019-05-16 DIAGNOSIS — I2510 Atherosclerotic heart disease of native coronary artery without angina pectoris: Secondary | ICD-10-CM | POA: Diagnosis present

## 2019-05-16 DIAGNOSIS — E871 Hypo-osmolality and hyponatremia: Secondary | ICD-10-CM | POA: Diagnosis present

## 2019-05-16 DIAGNOSIS — K7469 Other cirrhosis of liver: Secondary | ICD-10-CM | POA: Diagnosis not present

## 2019-05-16 DIAGNOSIS — K219 Gastro-esophageal reflux disease without esophagitis: Secondary | ICD-10-CM | POA: Diagnosis present

## 2019-05-16 DIAGNOSIS — Z91040 Latex allergy status: Secondary | ICD-10-CM | POA: Diagnosis not present

## 2019-05-16 DIAGNOSIS — Z800 Family history of malignant neoplasm of digestive organs: Secondary | ICD-10-CM | POA: Diagnosis not present

## 2019-05-16 DIAGNOSIS — Z8249 Family history of ischemic heart disease and other diseases of the circulatory system: Secondary | ICD-10-CM | POA: Diagnosis not present

## 2019-05-16 DIAGNOSIS — Z951 Presence of aortocoronary bypass graft: Secondary | ICD-10-CM | POA: Diagnosis not present

## 2019-05-16 DIAGNOSIS — D696 Thrombocytopenia, unspecified: Secondary | ICD-10-CM | POA: Diagnosis present

## 2019-05-16 LAB — COMPREHENSIVE METABOLIC PANEL
ALT: 43 U/L (ref 0–44)
AST: 58 U/L — ABNORMAL HIGH (ref 15–41)
Albumin: 2.6 g/dL — ABNORMAL LOW (ref 3.5–5.0)
Alkaline Phosphatase: 104 U/L (ref 38–126)
Anion gap: 8 (ref 5–15)
BUN: 15 mg/dL (ref 8–23)
CO2: 24 mmol/L (ref 22–32)
Calcium: 9.3 mg/dL (ref 8.9–10.3)
Chloride: 101 mmol/L (ref 98–111)
Creatinine, Ser: 0.94 mg/dL (ref 0.44–1.00)
GFR calc Af Amer: >60 mL/min (ref >60)
GFR calc non Af Amer: >60 mL/min (ref >60)
Glucose, Bld: 155 mg/dL — ABNORMAL HIGH (ref 70–99)
Potassium: 4 mmol/L (ref 3.5–5.1)
Sodium: 133 mmol/L — ABNORMAL LOW (ref 135–145)
Total Bilirubin: 4.2 mg/dL — ABNORMAL HIGH (ref 0.3–1.2)
Total Protein: 6.6 g/dL (ref 6.5–8.1)

## 2019-05-16 LAB — CBG MONITORING, ED: Glucose-Capillary: 235 mg/dL — ABNORMAL HIGH (ref 70–99)

## 2019-05-16 LAB — GLUCOSE, CAPILLARY
Glucose-Capillary: 145 mg/dL — ABNORMAL HIGH (ref 70–99)
Glucose-Capillary: 264 mg/dL — ABNORMAL HIGH (ref 70–99)
Glucose-Capillary: 120 mg/dL — ABNORMAL HIGH (ref 70–99)
Glucose-Capillary: 155 mg/dL — ABNORMAL HIGH (ref 70–99)

## 2019-05-16 LAB — CBC
HCT: 41.6 % (ref 36.0–46.0)
Hemoglobin: 14.1 g/dL (ref 12.0–15.0)
MCH: 32.4 pg (ref 26.0–34.0)
MCHC: 33.9 g/dL (ref 30.0–36.0)
MCV: 95.6 fL (ref 80.0–100.0)
Platelets: 99 10*3/uL — ABNORMAL LOW (ref 150–400)
RBC: 4.35 MIL/uL (ref 3.87–5.11)
RDW: 17 % — ABNORMAL HIGH (ref 11.5–15.5)
WBC: 6.2 10*3/uL (ref 4.0–10.5)
nRBC: 0 % (ref 0.0–0.2)

## 2019-05-16 MED ORDER — PNEUMOCOCCAL VAC POLYVALENT 25 MCG/0.5ML IJ INJ: 0.5000 mL | INJECTION | INTRAMUSCULAR | Status: DC

## 2019-05-16 MED ORDER — CALCIUM CARBONATE 1250 (500 CA) MG PO TABS
500.0000 mg | ORAL_TABLET | Freq: Every day | ORAL | Status: DC
  Administered 2019-05-16 – 2019-05-18 (×3): 500 mg via ORAL
  Filled 2019-05-16 (×3): qty 1

## 2019-05-16 MED ORDER — INFLUENZA VAC A&B SA ADJ QUAD 0.5 ML IM PRSY
0.5000 mL | PREFILLED_SYRINGE | INTRAMUSCULAR | Status: DC
  Filled 2019-05-16: qty 0.5

## 2019-05-16 NOTE — ED Notes (Signed)
RN off the floor and unable to take report. Will try again shortly.

## 2019-05-16 NOTE — Progress Notes (Signed)
[facility]  PROGRESS NOTE  [name] EGB:[id number] DOB: [birth year] DOA: 05/15/2019 PCP: Gayland Curry, DO Admit date - 05/15/2019   Admitting Physician Desiree Hane, MD  Outpatient Primary MD for the patient is Gayland Curry, DO  LOS - 0 Brief Narrative   [name] is a 74 y.o. year old female with medical history significant for HTN, diabetes, CAD status post CABG and NASH cirrhosis (2017) status post TIPS (04/09/2019) who presented on 05/15/2019 with generalized weakness, difficulty speaking and ambulating, nausea/vomiting with minimal bowel movements despite lactulose and rectal laxative (baseline bowel movements 4-5 a day).  Admitted with confusion and found to have elevated ammonia with working diagnosis of hepatic encephalopathy precipitated by likely urinary tract infection  In the ED work-up notable for tachycardia, soft BP, ammonia of 111, and worsening mentation.  Patient was given lactulose and started on rifaximin per GI recommendations for hepatic encephalopathy.  Patient was found to have underlying UTI which is likely precipitant of her hepatic encephalopathy for which she is currently on Rocephin   Subjective  This morning she has no complaints Denies any abdominal pain Tolerated breakfast okay Hopeful to be able to go home soon  A & P  Acute hepatic encephalopathy status post toes precipitated by likely UTI, significantly improving Mental status significantly improved, alert and oriented x4 today and following commands with increasing lactulose on admission -Appreciate GI recommendations -Continue high-dose lactulose (increase in baseline), rifaximin D (new this hospital stay), titrate to 3-5 BMs/dy -DC IV fluids given improvement in mental status  UTI No history of multidrug resistance.  Remains afebrile without leukocytosis -Continue Rocephin, monitor urine cultures to narrow antibiotics  Hypotension -Holding home Lasix and  spironolactone  Hyponatremia, mild, stable Likely related to cirrhosis -Daily BMP  Thrombocytopenia, stable No active signs of bleeding -Monitor CBC  [name] cirrhosis with esophageal varices, status post TIPS (12/20) stable Jaundiced on exam with icteric sclera, stable from prior exams.  No ascites or volume overload on exam LFTs stable No signs of decompensation -Treat hepatic encephalopathy as above -Daily CBC, daily INR  Type 2 diabetes Home regimen Levemir 50 units -Continue reduced dose at 20 units, monitor and titrate up as needed, monitor CBGs, sliding scale as needed  CKD, stage I Creatinine stable at baseline -Daily BMP  GERD, stable -Protonix daily     Family Communication  : No family at bedside  Code Status : Full code  Disposition Plan  : Go for observation to inpatient status given patient requiring IV ceftriaxone UTI, close monitoring of bowel movements for acute hepatic encephalopathy requiring high-dose lactulose and rifaximin  Consults  : GI  Procedures  : None  DVT Prophylaxis  : SCDs  Lab Results  Component Value Date   PLT 99 (L) 05/16/2019    Diet :  Diet Order            Diet Carb Modified Fluid consistency: Thin; Room service appropriate? Yes  Diet effective now               Inpatient Medications Scheduled Meds: . calcium carbonate  500 mg of elemental calcium Oral Daily  . cholecalciferol  2,000 Units Oral Daily  . ezetimibe  10 mg Oral Daily  . [START ON 05/17/2019] influenza vaccine adjuvanted  0.5 mL Intramuscular Tomorrow-1000  . insulin aspart  0-20 Units Subcutaneous TID WC  . insulin aspart  0-5 Units Subcutaneous QHS  . insulin detemir  15 Units Subcutaneous BID  .  lactulose  20 g Oral QID  . magnesium oxide  400 mg Oral Daily  . multivitamin with minerals  1 tablet Oral Daily  . pantoprazole  40 mg Oral Daily  . [START ON 05/17/2019] pneumococcal 23 valent vaccine  0.5 mL Intramuscular Tomorrow-1000  . polyvinyl  alcohol   Both Eyes Daily  . rifaximin  550 mg Oral BID   Continuous Infusions: . sodium chloride 75 mL/hr at 05/16/19 0527  . cefTRIAXone (ROCEPHIN)  IV Stopped (05/15/19 2100)   PRN Meds:.acetaminophen **OR** acetaminophen, bisacodyl, ondansetron  Antibiotics  :   Anti-infectives (From admission, onward)   Start     Dose/Rate Route Frequency Ordered Stop   05/15/19 2000  cefTRIAXone (ROCEPHIN) 1 g in sodium chloride 0.9 % 100 mL IVPB     1 g 200 mL/hr over 30 Minutes Intravenous Every 24 hours 05/15/19 1907     05/15/19 1415  rifaximin (XIFAXAN) tablet 550 mg     550 mg Oral 2 times daily 05/15/19 1344         Objective   Vitals:   05/15/19 1858 05/15/19 2230 05/16/19 0520 05/16/19 0811  BP: (!) 115/46 118/62 122/61 (!) 116/52  Pulse: 91 88 87 81  Resp: 18 18 18 16   Temp: 97.9 F (36.6 C) 98.4 F (36.9 C) 97.7 F (36.5 C) 97.7 F (36.5 C)  TempSrc: Oral Oral Oral   SpO2: 100% 99% 100% 99%  Weight:   63.4 kg   Height:   5' 3"  (1.6 m)     SpO2: 99 %  Wt Readings from Last 3 Encounters:  05/16/19 63.4 kg  05/13/19 61.7 kg  04/24/19 65.7 kg     Intake/Output Summary (Last 24 hours) at 05/16/2019 1357 Last data filed at 05/16/2019 1149 Gross per 24 hour  Intake 2399.39 ml  Output 1550 ml  Net 849.39 ml    Physical Exam:  Elderly female, lying in bed in no distress Mild scleral icterus Alert and oriented x4 Moving all extremities without focal deficits Abdomen soft, nondistended, normal bowel sounds No peripheral edema Lungs clear to auscultation, on room air, normal respiratory effort   I have personally reviewed the following:   Data Reviewed:  CBC Recent Labs  Lab 05/13/19 1436 05/15/19 1042 05/15/19 1056 05/16/19 0719  WBC 10.4 7.7  --  6.2  HGB 16.1* 14.9 15.0 14.1  HCT 47.2* 43.4 44.0 41.6  PLT 151 116*  --  99*  MCV 92.5 94.6  --  95.6  MCH 31.6 32.5  --  32.4  MCHC 34.1 34.3  --  33.9  RDW 16.2* 16.9*  --  17.0*  LYMPHSABS 770*  0.6*  --   --   MONOABS  --  1.1*  --   --   EOSABS 114 0.1  --   --   BASOSABS 62 0.0  --   --     Chemistries  Recent Labs  Lab 05/13/19 1436 05/15/19 1042 05/15/19 1056 05/16/19 0719  NA 133* 131* 133* 133*  K 4.5 4.6 4.6 4.0  CL 96* 97*  --  101  CO2 24 23  --  24  GLUCOSE 269* 138*  --  155*  BUN 19 20  --  15  CREATININE 1.05* 1.13*  --  0.94  CALCIUM 11.1* 10.0  --  9.3  AST 58* 62*  --  58*  ALT 41* 45*  --  43  ALKPHOS  --  127*  --  104  BILITOT 3.5* 3.4*  --  4.2*   ------------------------------------------------------------------------------------------------------------------ No results for input(s): CHOL, HDL, LDLCALC, TRIG, CHOLHDL, LDLDIRECT in the last 72 hours.  Lab Results  Component Value Date   HGBA1C 8.4 (H) 04/01/2019   ------------------------------------------------------------------------------------------------------------------ No results for input(s): TSH, T4TOTAL, T3FREE, THYROIDAB in the last 72 hours.  Invalid input(s): FREET3 ------------------------------------------------------------------------------------------------------------------ No results for input(s): VITAMINB12, FOLATE, FERRITIN, TIBC, IRON, RETICCTPCT in the last 72 hours.  Coagulation profile Recent Labs  Lab 05/13/19 1436 05/15/19 1042  INR 1.1 1.2    No results for input(s): DDIMER in the last 72 hours.  Cardiac Enzymes No results for input(s): CKMB, TROPONINI, MYOGLOBIN in the last 168 hours.  Invalid input(s): CK ------------------------------------------------------------------------------------------------------------------    Component Value Date/Time   BNP 307.8 (H) 03/06/2016 1404    Micro Results Recent Results (from the past 240 hour(s))  SARS CORONAVIRUS 2 (TAT 6-24 HRS) Nasopharyngeal Nasopharyngeal Swab     Status: None   Collection Time: 05/15/19  1:51 PM   Specimen: Nasopharyngeal Swab  Result Value Ref Range Status   SARS Coronavirus 2  NEGATIVE NEGATIVE Final    Comment: (NOTE) SARS-CoV-2 target nucleic acids are NOT DETECTED. The SARS-CoV-2 RNA is generally detectable in upper and lower respiratory specimens during the acute phase of infection. Negative results do not preclude SARS-CoV-2 infection, do not rule out co-infections with other pathogens, and should not be used as the sole basis for treatment or other patient management decisions. Negative results must be combined with clinical observations, patient history, and epidemiological information. The expected result is Negative. Fact Sheet for Patients: SugarRoll.be Fact Sheet for Healthcare Providers: https://www.woods-mathews.com/ This test is not yet approved or cleared by the [place] FDA and  has been authorized for detection and/or diagnosis of SARS-CoV-2 by FDA under an Emergency Use Authorization (EUA). This EUA will remain  in effect (meaning this test can be used) for the duration of the COVID-19 declaration under Section 56 4(b)(1) of the Act, 21 U.S.C. section 360bbb-3(b)(1), unless the authorization is terminated or revoked sooner. Performed at [facility], [place] [address] [place],  [address]     Radiology Reports CT Head Wo Contrast  Result Date: 05/15/2019 CLINICAL DATA:  Altered mental status.  Confusion. EXAM: CT HEAD WITHOUT CONTRAST TECHNIQUE: Contiguous axial images were obtained from the base of the skull through the vertex without intravenous contrast. COMPARISON:  None. FINDINGS: Brain: The ventricles and sulci appear normal in size and contour. There is no intracranial mass, hemorrhage, extra-axial fluid collection, or midline shift. There is mild small vessel disease in the centra semiovale bilaterally. No acute appearing infarct is evident on this study. Vascular: There is no hyperdense vessel. There is calcification in each carotid siphon region. Skull: The bony calvarium  appears intact. Sinuses/Orbits: There is mucosal thickening in several ethmoid air cells. Other visualized paranasal sinuses are clear. Orbits appear symmetric bilaterally. Other: Mastoid air cells are clear. IMPRESSION: Mild periventricular small vessel disease. No acute infarct. No mass or hemorrhage. There are foci of arterial vascular calcification. There is mucosal thickening in several ethmoid air cells. Electronically Signed   By: Lowella Grip III M.D.   On: 05/15/2019 11:37   DG Chest Portable 1 View  Result Date: 05/15/2019 CLINICAL DATA:  Altered mental status EXAM: PORTABLE CHEST 1 VIEW COMPARISON:  04/01/2019 FINDINGS: Prior CABG. Heart is normal size. Lungs are clear. No effusions. No acute bony abnormality. IMPRESSION: No active disease. Electronically Signed   By: Lennette Bihari  Dover M.D.   On: 05/15/2019 10:46     Time Spent in minutes  30     Desiree Hane M.D on 05/16/2019 at 1:57 PM  To page go to [email or web address] - password [facility]

## 2019-05-16 NOTE — Progress Notes (Addendum)
2107: Pt used call bell to communicate that she needed to have a bowel movement. Pt refused BSC. Both nurse tech and nurse re educated patient 3x each about fall safety and use of BSC. Pt continued to refuse and insisted on walking to RR. Pt slowly, unsteadily, walked to RR with walker. Pt then weakly stood at sink to brush teeth. Slow motor movements. Pt verbalized understanding of possible fall and physical harm.

## 2019-05-16 NOTE — Consult Note (Addendum)
Daily Rounding Note  05/16/2019, 8:27 AM  LOS: 0 days   SUBJECTIVE:   Chief complaint: hepatic encephalopathy    Had at least 3 BM's yesterday.  More alert.  OBJECTIVE:         Vital signs in last 24 hours:    Temp:  [97.7 F (36.5 C)-98.4 F (36.9 C)] 97.7 F (36.5 C) (01/07 0520) Pulse Rate:  [81-96] 81 (01/07 0811) Resp:  [14-20] 16 (01/07 0811) BP: (101-130)/(46-62) 116/52 (01/07 0811) SpO2:  [98 %-100 %] 99 % (01/07 0811) Weight:  [63.4 kg] 63.4 kg (01/07 0520) Last BM Date: 05/16/19 Filed Weights   05/16/19 0520  Weight: 63.4 kg   General: Awake, alert, talking into her clear liquid breakfast. Heart: RRR. Chest: Clear bilaterally.  No labored breathing, no cough, no wet vocal quality.  Voice is soft. Abdomen: Soft.  Not tender or distended.  Active bowel sounds. Extremities: No CCE. Neuro/Psych: Alert.  Appropriate.  Oriented x3.  Slight asterixis.  Hard of hearing.  Intake/Output from previous day: 01/06 0701 - 01/07 0700 In: 1704.2 [I.V.:1000; IV Piggyback:704.2] Out: -   Intake/Output this shift: No intake/output data recorded.  Lab Results: Recent Labs    05/13/19 1436 05/15/19 1042 05/15/19 1056 05/16/19 0719  WBC 10.4 7.7  --  6.2  HGB 16.1* 14.9 15.0 14.1  HCT 47.2* 43.4 44.0 41.6  PLT 151 116*  --  99*   BMET Recent Labs    05/13/19 1436 05/15/19 1042 05/15/19 1056 05/16/19 0719  NA 133* 131* 133* 133*  K 4.5 4.6 4.6 4.0  CL 96* 97*  --  101  CO2 24 23  --  24  GLUCOSE 269* 138*  --  155*  BUN 19 20  --  15  CREATININE 1.05* 1.13*  --  0.94  CALCIUM 11.1* 10.0  --  9.3   LFT Recent Labs    05/13/19 1436 05/15/19 1042 05/16/19 0719  PROT 7.8 7.2 6.6  ALBUMIN  --  3.0* 2.6*  AST 58* 62* 58*  ALT 41* 45* 43  ALKPHOS  --  127* 104  BILITOT 3.5* 3.4* 4.2*  BILIDIR 1.4*  --   --   IBILI 2.1*  --   --    PT/INR Recent Labs    05/13/19 1436 05/15/19 1042  LABPROT  11.0 14.6  INR 1.1 1.2   Hepatitis Panel No results for input(s): HEPBSAG, HCVAB, HEPAIGM, HEPBIGM in the last 72 hours.  Studies/Results: CT Head Wo Contrast  Result Date: 05/15/2019 CLINICAL DATA:  Altered mental status.  Confusion. EXAM: CT HEAD WITHOUT CONTRAST TECHNIQUE: Contiguous axial images were obtained from the base of the skull through the vertex without intravenous contrast. COMPARISON:  None. FINDINGS: Brain: The ventricles and sulci appear normal in size and contour. There is no intracranial mass, hemorrhage, extra-axial fluid collection, or midline shift. There is mild small vessel disease in the centra semiovale bilaterally. No acute appearing infarct is evident on this study. Vascular: There is no hyperdense vessel. There is calcification in each carotid siphon region. Skull: The bony calvarium appears intact. Sinuses/Orbits: There is mucosal thickening in several ethmoid air cells. Other visualized paranasal sinuses are clear. Orbits appear symmetric bilaterally. Other: Mastoid air cells are clear. IMPRESSION: Mild periventricular small vessel disease. No acute infarct. No mass or hemorrhage. There are foci of arterial vascular calcification. There is mucosal thickening in several ethmoid air cells. Electronically Signed   By: Gwyndolyn Saxon  Jasmine December III M.D.   On: 05/15/2019 11:37   DG Chest Portable 1 View  Result Date: 05/15/2019 CLINICAL DATA:  Altered mental status EXAM: PORTABLE CHEST 1 VIEW COMPARISON:  04/01/2019 FINDINGS: Prior CABG. Heart is normal size. Lungs are clear. No effusions. No acute bony abnormality. IMPRESSION: No active disease. Electronically Signed   By: Rolm Baptise M.D.   On: 05/15/2019 10:46   Scheduled Meds: . calcium carbonate  500 mg of elemental calcium Oral Daily  . cholecalciferol  2,000 Units Oral Daily  . ezetimibe  10 mg Oral Daily  . insulin aspart  0-20 Units Subcutaneous TID WC  . insulin aspart  0-5 Units Subcutaneous QHS  . insulin detemir  15  Units Subcutaneous BID  . lactulose  20 g Oral QID  . magnesium oxide  400 mg Oral Daily  . multivitamin with minerals  1 tablet Oral Daily  . pantoprazole  40 mg Oral Daily  . polyvinyl alcohol   Both Eyes Daily  . rifaximin  550 mg Oral BID   Continuous Infusions: . sodium chloride 75 mL/hr at 05/16/19 0527  . cefTRIAXone (ROCEPHIN)  IV Stopped (05/15/19 2100)   PRN Meds:.acetaminophen **OR** acetaminophen, bisacodyl, ondansetron   ASSESMENT:   *   New onset HE in pt s/p 04/09/19 TIPS for esoph variceal bleeding.   High dose lactulose (dose increased from baseline), Rifaximin (new) in place.    *   UTI.  Many bacteria, >50 WBCs on U/A.  Certainly could be trigger for HE.  Day 2 Rocephin.    *   Hyponatremia.    *   Thrombocytopenia.  INR 1.2.     *   NASH cirrhosis.  Mild jaundice is stable c/w previous month but accelerated c/w earlier in 2020.    PLAN   *   Continue high dose lactulose, Rifaxiimin, Rocephin or other abx.  Advance to carb mod diet.    [name]  05/16/2019, 8:27 AM Phone [phone]  GI ATTENDING  Interval history data reviewed.  Patient seen and examined.  Agree with interval progress note as outlined.  Patient is more alert.  Suspect that underlying UTI precipitated her hepatic encephalopathy.  Continue Xifaxan twice daily.  Also continue lactulose daily to achieve 3-5 bowel movements each day.  Will follow.  Docia Chuck. Geri Seminole., M.D. [facility] Division of Gastroenterology

## 2019-05-16 NOTE — ED Notes (Signed)
Pt comfortable in bed. No complaints.

## 2019-05-16 NOTE — ED Notes (Signed)
Pt to restroom. + large BM.

## 2019-05-17 ENCOUNTER — Inpatient Hospital Stay (HOSPITAL_COMMUNITY): Payer: Medicare Other

## 2019-05-17 DIAGNOSIS — E869 Volume depletion, unspecified: Secondary | ICD-10-CM

## 2019-05-17 DIAGNOSIS — K7469 Other cirrhosis of liver: Secondary | ICD-10-CM

## 2019-05-17 LAB — CBC
HCT: 40.2 % (ref 36.0–46.0)
Hemoglobin: 13.4 g/dL (ref 12.0–15.0)
MCH: 31.9 pg (ref 26.0–34.0)
MCHC: 33.3 g/dL (ref 30.0–36.0)
MCV: 95.7 fL (ref 80.0–100.0)
Platelets: 86 10*3/uL — ABNORMAL LOW (ref 150–400)
RBC: 4.2 MIL/uL (ref 3.87–5.11)
RDW: 16.8 % — ABNORMAL HIGH (ref 11.5–15.5)
WBC: 5.4 10*3/uL (ref 4.0–10.5)
nRBC: 0 % (ref 0.0–0.2)

## 2019-05-17 LAB — BASIC METABOLIC PANEL
Anion gap: 8 (ref 5–15)
BUN: 13 mg/dL (ref 8–23)
CO2: 22 mmol/L (ref 22–32)
Calcium: 9.1 mg/dL (ref 8.9–10.3)
Chloride: 104 mmol/L (ref 98–111)
Creatinine, Ser: 0.89 mg/dL (ref 0.44–1.00)
GFR calc Af Amer: >60 mL/min (ref >60)
GFR calc non Af Amer: >60 mL/min (ref >60)
Glucose, Bld: 168 mg/dL — ABNORMAL HIGH (ref 70–99)
Potassium: 4.1 mmol/L (ref 3.5–5.1)
Sodium: 134 mmol/L — ABNORMAL LOW (ref 135–145)

## 2019-05-17 LAB — GLUCOSE, CAPILLARY
Glucose-Capillary: 180 mg/dL — ABNORMAL HIGH (ref 70–99)
Glucose-Capillary: 266 mg/dL — ABNORMAL HIGH (ref 70–99)
Glucose-Capillary: 170 mg/dL — ABNORMAL HIGH (ref 70–99)
Glucose-Capillary: 222 mg/dL — ABNORMAL HIGH (ref 70–99)

## 2019-05-17 MED ORDER — LACTULOSE 10 GM/15ML PO SOLN
30.0000 g | Freq: Four times a day (QID) | ORAL | Status: DC
  Administered 2019-05-17 – 2019-05-18 (×5): 30 g via ORAL
  Filled 2019-05-17 (×5): qty 45

## 2019-05-17 NOTE — Progress Notes (Signed)
[facility]  PROGRESS NOTE  [name] EVO:[id number] DOB: 09-24-1945 DOA: 05/15/2019 PCP: Gayland Curry, DO Admit date - 05/15/2019   Admitting Physician Desiree Hane, MD  Outpatient Primary MD for the patient is Gayland Curry, DO  LOS - 1 Brief Narrative   [name] is a 74 y.o. year old female with medical history significant for HTN, diabetes, CAD status post CABG and NASH cirrhosis (2017) status post TIPS (04/09/2019) who presented on 05/15/2019 with generalized weakness, difficulty speaking and ambulating, nausea/vomiting with minimal bowel movements despite lactulose and rectal laxative (baseline bowel movements 4-5 a day).  Admitted with confusion and found to have elevated ammonia with working diagnosis of hepatic encephalopathy precipitated by likely urinary tract infection  In the ED work-up notable for tachycardia, soft BP, ammonia of 111, and worsening mentation.  Patient was given lactulose and started on rifaximin per GI recommendations for hepatic encephalopathy.  Patient was found to have underlying UTI which is likely precipitant of her hepatic encephalopathy for which she is currently on Rocephin   Subjective  Kayla at bedside Has no complaints  A & P  Acute hepatic encephalopathy status post toes precipitated by likely UTI, significantly improving Mental status significantly improved from admission with increasing lactulose, alert and oriented though slow to answer questions, has only had 1 BM overnight -Appreciate GI recommendations, plan for follow-up in 4 weeks -Continue high-dose lactulose 30 g QID increased today(increase in baseline), rifaximin 550 mg twice daily (new this hospital stay), titrate to 3-5 BMs/dy--only 1 bowel movement overnight -We will monitor mental status overnight given completed therapy for UTI, remains stable anticipate discharge in 24 hours  UTI No history of multidrug resistance.  Remains afebrile without  leukocytosis -Completed full course of Rocephin today -Monitor mental status off antibiotics, to ensure no recurrent hepatic encephalopathy given patient high risk with advanced liver disease/cirrhosis  Hypotension SBP's in the low 100s off IV fluids, with peak of 122. -Continue to closely monitor -Holding home Lasix and spironolactone  Hyponatremia, mild, stable Likely related to cirrhosis -Daily BMP  Thrombocytopenia, stable No active signs of bleeding -Monitor CBC  [name] cirrhosis with esophageal varices, status post TIPS (12/20) stable Jaundiced on exam with icteric sclera, stable from prior exams.  No ascites or volume overload on exam LFTs stable No signs of decompensation -Treat hepatic encephalopathy as above -Daily CBC, daily INR  Type 2 diabetes Home regimen Levemir 50 units -Continue reduced dose at 20 units, monitor and titrate up as needed, monitor CBGs, sliding scale as needed  CKD, stage I Creatinine stable at baseline -Daily BMP  GERD, stable -Protonix daily     Family Communication  : [name]) updated at bedside Code Status : Full code  Disposition Plan  : Close monitoring of mental status, lactulose increased to assist with adequate bowel movements/mental status, if improved in 24 hours likely discharge on 1/9, PT eval  Consults  : GI  Procedures  : None  DVT Prophylaxis  : SCDs  Lab Results  Component Value Date   PLT 86 (L) 05/17/2019    Diet :  Diet Order            Diet Carb Modified Fluid consistency: Thin; Room service appropriate? Yes  Diet effective now               Inpatient Medications Scheduled Meds: . calcium carbonate  500 mg of elemental calcium Oral Daily  . cholecalciferol  2,000 Units Oral  Daily  . ezetimibe  10 mg Oral Daily  . influenza vaccine adjuvanted  0.5 mL Intramuscular Tomorrow-1000  . insulin aspart  0-20 Units Subcutaneous TID WC  . insulin aspart  0-5 Units Subcutaneous QHS  . insulin detemir   15 Units Subcutaneous BID  . lactulose  30 g Oral QID  . magnesium oxide  400 mg Oral Daily  . multivitamin with minerals  1 tablet Oral Daily  . pantoprazole  40 mg Oral Daily  . pneumococcal 23 valent vaccine  0.5 mL Intramuscular Tomorrow-1000  . polyvinyl alcohol   Both Eyes Daily  . rifaximin  550 mg Oral BID   Continuous Infusions: . cefTRIAXone (ROCEPHIN)  IV 1 g (05/16/19 2216)   PRN Meds:.acetaminophen **OR** acetaminophen, bisacodyl, ondansetron  Antibiotics  :   Anti-infectives (From admission, onward)   Start     Dose/Rate Route Frequency Ordered Stop   05/15/19 2000  cefTRIAXone (ROCEPHIN) 1 g in sodium chloride 0.9 % 100 mL IVPB     1 g 200 mL/hr over 30 Minutes Intravenous Every 24 hours 05/15/19 1907     05/15/19 1415  rifaximin (XIFAXAN) tablet 550 mg     550 mg Oral 2 times daily 05/15/19 1344         Objective   Vitals:   05/16/19 1547 05/16/19 2252 05/17/19 0500 05/17/19 0910  BP: (!) 104/46 121/60  (!) 107/46  Pulse: 84 78  87  Resp: 16 17  17   Temp: 98 F (36.7 C) 98.1 F (36.7 C)  98.6 F (37 C)  TempSrc: Oral Oral  Oral  SpO2: 96% 95%  98%  Weight:   65.8 kg   Height:        SpO2: 98 %  Wt Readings from Last 3 Encounters:  05/17/19 65.8 kg  05/13/19 61.7 kg  04/24/19 65.7 kg     Intake/Output Summary (Last 24 hours) at 05/17/2019 1755 Last data filed at 05/17/2019 1300 Gross per 24 hour  Intake 640 ml  Output 300 ml  Net 340 ml    Physical Exam:  Elderly female, lying in bed in no distress Mild scleral icterus Alert and oriented x4, slow speech Moving all extremities without focal deficits Abdomen soft, nondistended, normal bowel sounds No peripheral edema Lungs clear to auscultation, on room air, normal respiratory effort   I have personally reviewed the following:   Data Reviewed:  CBC Recent Labs  Lab 05/13/19 1436 05/15/19 1042 05/15/19 1056 05/16/19 0719 05/17/19 0319  WBC 10.4 7.7  --  6.2 5.4  HGB 16.1*  14.9 15.0 14.1 13.4  HCT 47.2* 43.4 44.0 41.6 40.2  PLT 151 116*  --  99* 86*  MCV 92.5 94.6  --  95.6 95.7  MCH 31.6 32.5  --  32.4 31.9  MCHC 34.1 34.3  --  33.9 33.3  RDW 16.2* 16.9*  --  17.0* 16.8*  LYMPHSABS 770* 0.6*  --   --   --   MONOABS  --  1.1*  --   --   --   EOSABS 114 0.1  --   --   --   BASOSABS 62 0.0  --   --   --     Chemistries  Recent Labs  Lab 05/13/19 1436 05/15/19 1042 05/15/19 1056 05/16/19 0719 05/17/19 0319  NA 133* 131* 133* 133* 134*  K 4.5 4.6 4.6 4.0 4.1  CL 96* 97*  --  101 104  CO2 24 23  --  24 22  GLUCOSE 269* 138*  --  155* 168*  BUN 19 20  --  15 13  CREATININE 1.05* 1.13*  --  0.94 0.89  CALCIUM 11.1* 10.0  --  9.3 9.1  AST 58* 62*  --  58*  --   ALT 41* 45*  --  43  --   ALKPHOS  --  127*  --  104  --   BILITOT 3.5* 3.4*  --  4.2*  --    ------------------------------------------------------------------------------------------------------------------ No results for input(s): CHOL, HDL, LDLCALC, TRIG, CHOLHDL, LDLDIRECT in the last 72 hours.  Lab Results  Component Value Date   HGBA1C 8.4 (H) 04/01/2019   ------------------------------------------------------------------------------------------------------------------ No results for input(s): TSH, T4TOTAL, T3FREE, THYROIDAB in the last 72 hours.  Invalid input(s): FREET3 ------------------------------------------------------------------------------------------------------------------ No results for input(s): VITAMINB12, FOLATE, FERRITIN, TIBC, IRON, RETICCTPCT in the last 72 hours.  Coagulation profile Recent Labs  Lab 05/13/19 1436 05/15/19 1042  INR 1.1 1.2    No results for input(s): DDIMER in the last 72 hours.  Cardiac Enzymes No results for input(s): CKMB, TROPONINI, MYOGLOBIN in the last 168 hours.  Invalid input(s): CK ------------------------------------------------------------------------------------------------------------------    Component Value  Date/Time   BNP 307.8 (H) 03/06/2016 1404    Micro Results Recent Results (from the past 240 hour(s))  SARS CORONAVIRUS 2 (TAT 6-24 HRS) Nasopharyngeal Nasopharyngeal Swab     Status: None   Collection Time: 05/15/19  1:51 PM   Specimen: Nasopharyngeal Swab  Result Value Ref Range Status   SARS Coronavirus 2 NEGATIVE NEGATIVE Final    Comment: (NOTE) SARS-CoV-2 target nucleic acids are NOT DETECTED. The SARS-CoV-2 RNA is generally detectable in upper and lower respiratory specimens during the acute phase of infection. Negative results do not preclude SARS-CoV-2 infection, do not rule out co-infections with other pathogens, and should not be used as the sole basis for treatment or other patient management decisions. Negative results must be combined with clinical observations, patient history, and epidemiological information. The expected result is Negative. Fact Sheet for Patients: SugarRoll.be Fact Sheet for Healthcare Providers: https://www.woods-mathews.com/ This test is not yet approved or cleared by the [place] FDA and  has been authorized for detection and/or diagnosis of SARS-CoV-2 by FDA under an Emergency Use Authorization (EUA). This EUA will remain  in effect (meaning this test can be used) for the duration of the COVID-19 declaration under Section 56 4(b)(1) of the Act, 21 U.S.C. section 360bbb-3(b)(1), unless the authorization is terminated or revoked sooner. Performed at [facility], [place] [address], [place], [place] [address]     Radiology Reports CT Head Wo Contrast  Result Date: 05/15/2019 CLINICAL DATA:  Altered mental status.  Confusion. EXAM: CT HEAD WITHOUT CONTRAST TECHNIQUE: Contiguous axial images were obtained from the base of the skull through the vertex without intravenous contrast. COMPARISON:  None. FINDINGS: Brain: The ventricles and sulci appear normal in size and contour. There is no  intracranial mass, hemorrhage, extra-axial fluid collection, or midline shift. There is mild small vessel disease in the centra semiovale bilaterally. No acute appearing infarct is evident on this study. Vascular: There is no hyperdense vessel. There is calcification in each carotid siphon region. Skull: The bony calvarium appears intact. Sinuses/Orbits: There is mucosal thickening in several ethmoid air cells. Other visualized paranasal sinuses are clear. Orbits appear symmetric bilaterally. Other: Mastoid air cells are clear. IMPRESSION: Mild periventricular small vessel disease. No acute infarct. No mass or hemorrhage. There are foci of arterial vascular  calcification. There is mucosal thickening in several ethmoid air cells. Electronically Signed   By: Lowella Grip III M.D.   On: 05/15/2019 11:37   DG Chest Portable 1 View  Result Date: 05/15/2019 CLINICAL DATA:  Altered mental status EXAM: PORTABLE CHEST 1 VIEW COMPARISON:  04/01/2019 FINDINGS: Prior CABG. Heart is normal size. Lungs are clear. No effusions. No acute bony abnormality. IMPRESSION: No active disease. Electronically Signed   By: Rolm Baptise M.D.   On: 05/15/2019 10:46     Time Spent in minutes  30     Desiree Hane M.D on 05/17/2019 at 5:55 PM  To page go to [email or web address] - password [facility]

## 2019-05-17 NOTE — Progress Notes (Addendum)
Daily Rounding Note  05/17/2019, 10:10 AM  LOS: 1 day   SUBJECTIVE:   Chief complaint: Hepatic encephalopathy. 1 BM yesterday.  Patient tolerating p.o.  No complaint of abdominal pain or nausea.  Feeling better but still weak.  OBJECTIVE:         Vital signs in last 24 hours:    Temp:  [98 F (36.7 C)-98.6 F (37 C)] 98.6 F (37 C) (01/08 0910) Pulse Rate:  [78-87] 87 (01/08 0910) Resp:  [16-17] 17 (01/08 0910) BP: (104-121)/(46-60) 107/46 (01/08 0910) SpO2:  [95 %-98 %] 98 % (01/08 0910) Weight:  [65.8 kg] 65.8 kg (01/08 0500) Last BM Date: 05/16/19 Filed Weights   05/16/19 0520 05/17/19 0500  Weight: 63.4 kg 65.8 kg   General: Chronically ill looking, alert, comfortable Heart: RRR. Chest: Clear bilaterally.  No labored breathing or cough. Abdomen: Soft.  Not tender.  Not distended.  Active bowel sounds. Extremities: No CCE. Neuro/Psych: Alert.  Oriented x3.  Appropriate.  No asterixis today  Intake/Output from previous day: 01/07 0701 - 01/08 0700 In: 695.2 [I.V.:695.2] Out: 1550 [Urine:1550]  Intake/Output this shift: Total I/O In: 400 [P.O.:400] Out: -   Lab Results: Recent Labs    05/15/19 1042 05/15/19 1056 05/16/19 0719 05/17/19 0319  WBC 7.7  --  6.2 5.4  HGB 14.9 15.0 14.1 13.4  HCT 43.4 44.0 41.6 40.2  PLT 116*  --  99* 86*   BMET Recent Labs    05/15/19 1042 05/15/19 1056 05/16/19 0719 05/17/19 0319  NA 131* 133* 133* 134*  K 4.6 4.6 4.0 4.1  CL 97*  --  101 104  CO2 23  --  24 22  GLUCOSE 138*  --  155* 168*  BUN 20  --  15 13  CREATININE 1.13*  --  0.94 0.89  CALCIUM 10.0  --  9.3 9.1   LFT Recent Labs    05/15/19 1042 05/16/19 0719  PROT 7.2 6.6  ALBUMIN 3.0* 2.6*  AST 62* 58*  ALT 45* 43  ALKPHOS 127* 104  BILITOT 3.4* 4.2*   PT/INR Recent Labs    05/15/19 1042  LABPROT 14.6  INR 1.2   Hepatitis Panel No results for input(s): HEPBSAG, HCVAB, HEPAIGM,  HEPBIGM in the last 72 hours.  Studies/Results: CT Head Wo Contrast  Result Date: 05/15/2019 CLINICAL DATA:  Altered mental status.  Confusion. EXAM: CT HEAD WITHOUT CONTRAST TECHNIQUE: Contiguous axial images were obtained from the base of the skull through the vertex without intravenous contrast. COMPARISON:  None. FINDINGS: Brain: The ventricles and sulci appear normal in size and contour. There is no intracranial mass, hemorrhage, extra-axial fluid collection, or midline shift. There is mild small vessel disease in the centra semiovale bilaterally. No acute appearing infarct is evident on this study. Vascular: There is no hyperdense vessel. There is calcification in each carotid siphon region. Skull: The bony calvarium appears intact. Sinuses/Orbits: There is mucosal thickening in several ethmoid air cells. Other visualized paranasal sinuses are clear. Orbits appear symmetric bilaterally. Other: Mastoid air cells are clear. IMPRESSION: Mild periventricular small vessel disease. No acute infarct. No mass or hemorrhage. There are foci of arterial vascular calcification. There is mucosal thickening in several ethmoid air cells. Electronically Signed   By: Lowella Grip III M.D.   On: 05/15/2019 11:37   DG Chest Portable 1 View  Result Date: 05/15/2019 CLINICAL DATA:  Altered mental status EXAM: PORTABLE CHEST 1 VIEW COMPARISON:  04/01/2019 FINDINGS: Prior CABG. Heart is normal size. Lungs are clear. No effusions. No acute bony abnormality. IMPRESSION: No active disease. Electronically Signed   By: Rolm Baptise M.D.   On: 05/15/2019 10:46   Scheduled Meds: . calcium carbonate  500 mg of elemental calcium Oral Daily  . cholecalciferol  2,000 Units Oral Daily  . ezetimibe  10 mg Oral Daily  . influenza vaccine adjuvanted  0.5 mL Intramuscular Tomorrow-1000  . insulin aspart  0-20 Units Subcutaneous TID WC  . insulin aspart  0-5 Units Subcutaneous QHS  . insulin detemir  15 Units Subcutaneous BID    . lactulose  20 g Oral QID  . magnesium oxide  400 mg Oral Daily  . multivitamin with minerals  1 tablet Oral Daily  . pantoprazole  40 mg Oral Daily  . pneumococcal 23 valent vaccine  0.5 mL Intramuscular Tomorrow-1000  . polyvinyl alcohol   Both Eyes Daily  . rifaximin  550 mg Oral BID   Continuous Infusions: . cefTRIAXone (ROCEPHIN)  IV 1 g (05/16/19 2216)   PRN Meds:.acetaminophen **OR** acetaminophen, bisacodyl, ondansetron ASSESMENT:   *   New onset HE in pt s/p 04/09/19 TIPS for esoph variceal bleeding.   High dose lactulose (dose increased from baseline), Rifaximin (new) in place.   However patient has only had one bowel movement yesterday and a few the previous day.  *   UTI.  Many bacteria, >50 WBCs on U/A.  Certainly could be trigger for HE.  Day 3 Rocephin.    *   Hyponatremia.    Improved.  *   Thrombocytopenia.  INR 1.2.     *   NASH cirrhosis.  Mild jaundice is stable c/w previous month but accelerated c/w earlier in 2020.      PLAN   *    Continue supportive care. Increasing Lactulose to 30 gm qid.  Continue rifaximin twice daily.  Azucena Freed  05/17/2019, 10:10 AM Phone [phone]  GI ATTENDING  Interval history data reviewed.  Patient seen and examined.  Encephalopathy has significantly improved.  Likely induced by urinary tract infection.  Obviously, recommend adequate medical treatment of UTI.  In terms of encephalopathy, she should continue rifaximin 550 mg twice daily and titrate lactulose daily to achieve 3-5 bowel movements.  She should have routine GI office follow-up in about 4 weeks post hospital discharge.  We will arrange that.  Please call if you have any questions or problems.  We will sign off.  Docia Chuck. Geri Seminole., M.D. [facility] Division of Gastroenterology

## 2019-05-18 ENCOUNTER — Encounter (HOSPITAL_COMMUNITY): Payer: Self-pay | Admitting: Internal Medicine

## 2019-05-18 LAB — BASIC METABOLIC PANEL
Anion gap: 11 (ref 5–15)
BUN: 12 mg/dL (ref 8–23)
CO2: 19 mmol/L — ABNORMAL LOW (ref 22–32)
Calcium: 9.1 mg/dL (ref 8.9–10.3)
Chloride: 101 mmol/L (ref 98–111)
Creatinine, Ser: 0.79 mg/dL (ref 0.44–1.00)
GFR calc Af Amer: >60 mL/min (ref >60)
GFR calc non Af Amer: >60 mL/min (ref >60)
Glucose, Bld: 293 mg/dL — ABNORMAL HIGH (ref 70–99)
Potassium: 3.9 mmol/L (ref 3.5–5.1)
Sodium: 131 mmol/L — ABNORMAL LOW (ref 135–145)

## 2019-05-18 LAB — CBC
HCT: 39.8 % (ref 36.0–46.0)
Hemoglobin: 13.3 g/dL (ref 12.0–15.0)
MCH: 32.1 pg (ref 26.0–34.0)
MCHC: 33.4 g/dL (ref 30.0–36.0)
MCV: 96.1 fL (ref 80.0–100.0)
Platelets: 86 10*3/uL — ABNORMAL LOW (ref 150–400)
RBC: 4.14 MIL/uL (ref 3.87–5.11)
RDW: 16.4 % — ABNORMAL HIGH (ref 11.5–15.5)
WBC: 5.5 10*3/uL (ref 4.0–10.5)
nRBC: 0 % (ref 0.0–0.2)

## 2019-05-18 LAB — GLUCOSE, CAPILLARY
Glucose-Capillary: 301 mg/dL — ABNORMAL HIGH (ref 70–99)
Glucose-Capillary: 233 mg/dL — ABNORMAL HIGH (ref 70–99)

## 2019-05-18 LAB — AMMONIA: Ammonia: 39 umol/L — ABNORMAL HIGH (ref 9–35)

## 2019-05-18 MED ORDER — CEFDINIR 300 MG PO CAPS: 300.0000 mg | ORAL_CAPSULE | Freq: Two times a day (BID) | ORAL | 0 refills | Status: AC

## 2019-05-18 MED ORDER — ONDANSETRON HCL 4 MG PO TABS: 4.0000 mg | ORAL_TABLET | Freq: Four times a day (QID) | ORAL | 0 refills | Status: DC | PRN

## 2019-05-18 MED ORDER — RIFAXIMIN 550 MG PO TABS: 550.0000 mg | ORAL_TABLET | Freq: Two times a day (BID) | ORAL | 1 refills | Status: DC

## 2019-05-18 MED ORDER — LACTULOSE 10 GM/15ML PO SOLN: 30.0000 g | Freq: Four times a day (QID) | ORAL | 0 refills | Status: DC

## 2019-05-18 NOTE — Care Management (Signed)
Patient given script for Xifaxin. Mcarthur Rossetti is not covered without prior authorization.  Discussed with staff at [facility].  They have faxed form for Dr. Lonny Prude to complete.  Dr. Lonny Prude is aware and will complete process tomorrow.

## 2019-05-18 NOTE — Discharge Summary (Signed)
[name] RPR:[id number] DOB: 02-03-1946 DOA: 05/15/2019  PCP: Gayland Curry, DO  Admit date: 05/15/2019 Discharge date: 05/18/2019  Admitted From: Home Disposition:  Home  Recommendations for Outpatient Follow-up:  1. Follow up with PCP in 1-2 weeks. GI follow up 2. Please obtain BMP/CBC in one week 3. New medications: cefdenir, lactulose increased, added rifaxamin 4. Please follow up on the following pending results:  Home Health:PT Equipment/Devices:None  Discharge Condition:Stable CODE STATUS:FULL Diet recommendation:  Carb Modified   Brief/Interim Summary: History of present illness:  [name] is a 74 y.o. year old female with medical history significant for HTN, diabetes, CAD status post CABG and NASH cirrhosis (2017) status post TIPS (04/09/2019) who presented on 05/15/2019 with generalized weakness, difficulty speaking and ambulating, nausea/vomiting with minimal bowel movements despite lactulose and rectal laxative (baseline bowel movements 4-5 a day).  Admitted with confusion and found to have elevated ammonia with working diagnosis of hepatic encephalopathy precipitated by likely urinary tract infection  In the ED work-up notable for tachycardia, soft BP, ammonia of 111, and worsening mentation.   Remaining hospital course addressed in problem based format below:   Hospital Course:   Acute hepatic encephalopathy  precipitated by likely UTI, resolved Patient's home regimen of lactulose was increased and started on rifaximin per GI recommendations for hepatic encephalopathy.  Patient was found to have underlying UTI which is likely precipitant of her hepatic encephalopathy. With 3-5 BMs on new regimen mental status quickly improved to alert and oriented x4.  \ --GI recommends continuing high dose lactulose 30 g QID and rifaxamin 550 mg BID -- plan for GI  follow-up in 4 weeks --Family understands importance of ensuring BMs with her regimen --Repeat ammonia on  discharge of 39 (111 on admission)  UTI No history of multidrug resistance.  Remained afebrile without leukocytosis on transition from IV ceftriaxone to cefdenir prior to discharge --will continue cefdenir on discharge to complete course  Chronic Hypotension Initially held home lasix and spirnolactone Remained stable at baseline 107/46 on discharge --can resume home Lasix and spironolactone  Hyponatremia, mild, stable Likely related to cirrhosis. 131 on discharge Was monitored during hospital stay , no intervention necessary   Chronic Thrombocytopenia, stable Baseline labile, 56-110.  86 on discharge No active signs of bleeding during hospital stay and no transfusions needed.  [name] cirrhosis with esophageal varices, status post TIPS (12/20) stable Jaundiced on exam with icteric sclera, stable from prior exams.  No ascites or volume overload on exam LFTs stable No signs of decompensation other than hepatic encephalopathy on admission   Type 2 diabetes Continue home regimen Levemir 50 units  CKD, stage I Creatinine stable at baseline  GERD, stable -Protonix daily   Consultations:  GI  Procedures/Studies: none Subjective: Feels well No abdominal pain or nausea Eating well   Discharge Exam: Vitals:   05/17/19 2045 05/18/19 0939  BP: (!) 108/57 (!) 106/47  Pulse: 80 82  Resp: 16 16  Temp: 98.4 F (36.9 C) 97.8 F (36.6 C)  SpO2: 99% 100%   Vitals:   05/17/19 0910 05/17/19 1800 05/17/19 2045 05/18/19 0939  BP: (!) 107/46 (!) 116/57 (!) 108/57 (!) 106/47  Pulse: 87 93 80 82  Resp: 17 18 16 16   Temp: 98.6 F (37 C) 98.4 F (36.9 C) 98.4 F (36.9 C) 97.8 F (36.6 C)  TempSrc: Oral Oral Oral Oral  SpO2: 98% 100% 99% 100%  Weight:      Height:  General: frail elderly female, lying in bed, no apparent distress Eyes: EOMI, icteric sclera ENT: Oral Mucosa clear and moist Cardiovascular:  no edema, Respiratory: Normal respiratory effort on  room air, lungs clear to auscultation bilaterally Abdomen: soft, non-distended, non-tender, normal bowel sounds Skin: Jaundiced Neurologic: Alert and oriented to person, place, time, context, slow speech but clear. No focal deficits. No asterixis Psychiatric: flat affect  Discharge Diagnoses:  Principal Problem:   Hepatic encephalopathy ([place]) Active Problems:   DM (diabetes mellitus) ([place])   S/P CABG x 3   Liver cirrhosis ([facility])   General weakness   Volume depletion   Acute lower UTI   Acute delirium    Discharge Instructions  Discharge Instructions    Diet - low sodium heart healthy   Complete by: As directed    Increase activity slowly   Complete by: As directed      Allergies as of 05/18/2019      Reactions   Kiwi Extract Anaphylaxis   Tdap [tetanus-diphth-acell Pertussis] Swelling, Other (See Comments)   Swelling at injection site, gets very hot   Statins Other (See Comments)   RHABDOMYOLYSIS   Latex Itching, Dermatitis, Rash   Tramadol Nausea And Vomiting      Medication List    TAKE these medications   acetaminophen 500 MG tablet Commonly known as: TYLENOL Take 500 mg by mouth at bedtime.   BD Pen Needle Nano U/F 32G X 4 MM Misc Generic drug: Insulin Pen Needle USE THREE TIMES DAILY AS DIRECTED   Biotin 10000 MCG Tabs Take 10,000 mcg by mouth every morning.   bisacodyl 10 MG suppository Commonly known as: DULCOLAX Place 1 suppository (10 mg total) rectally daily as needed for moderate constipation.   calcium carbonate 600 MG Tabs tablet Commonly known as: OS-CAL Take 600 mg by mouth daily.   cefdinir 300 MG capsule Commonly known as: OMNICEF Take 1 capsule (300 mg total) by mouth 2 (two) times daily for 2 days.   ezetimibe 10 MG tablet Commonly known as: ZETIA TAKE 1 TABLET(10 MG) BY MOUTH DAILY What changed: See the new instructions.   glucose blood test strip One Touch Ultra II strips. Use to test blood sugar three times daily. Dx: E11.65    insulin detemir 100 UNIT/ML injection Commonly known as: Levemir Inject 0.2 mLs (20 Units total) into the skin at bedtime. What changed: how much to take   INSULIN SYRINGE 1CC/31GX5/16" 31G X 5/16" 1 ML Misc USE AS DIRECTED DAILY WITH LEVEMIR   Jardiance 25 MG Tabs tablet Generic drug: empagliflozin Take 25 mg by mouth daily.   lactulose 10 GM/15ML solution Commonly known as: CHRONULAC Take 45 mLs (30 g total) by mouth 4 (four) times daily. What changed: how much to take   Lasix 40 MG tablet Generic drug: furosemide Take 40 mg by mouth daily.   loratadine 10 MG tablet Commonly known as: CLARITIN Take 10 mg by mouth daily as needed for allergies.   MAGNESIUM PO Take 500 mg by mouth daily.   multivitamin with minerals tablet Take 1 tablet by mouth daily.   NovoLOG FlexPen 100 UNIT/ML FlexPen Generic drug: insulin aspart Inject 12 units under the skin every morning, 8 units at lunch and 12 units at supper   ondansetron 4 MG tablet Commonly known as: ZOFRAN Take 1 tablet (4 mg total) by mouth every 6 (six) hours as needed for nausea.   pantoprazole 40 MG tablet Commonly known as: Protonix Take 1 tablet (40 mg  total) by mouth 2 (two) times daily. What changed: when to take this   PROBIOTIC DAILY PO Take 1 capsule by mouth daily. Digestive Advantage Probiotic   rifaximin 550 MG Tabs tablet Commonly known as: XIFAXAN Take 1 tablet (550 mg total) by mouth 2 (two) times daily.   spironolactone 50 MG tablet Commonly known as: Aldactone Take 1 tablet (50 mg total) by mouth 2 (two) times daily. What changed: when to take this   SYSTANE OP Place 1 drop into both eyes daily.   VICKS VAPOR IN Vicks Vapor Rub apply small amount to outside of nose to help breathing   VITAMIN B 12 PO Take 1,000 mcg by mouth daily.   Vitamin D 50 MCG (2000 UT) Caps Take 2,000 Units by mouth daily.       Allergies  Allergen Reactions  . Kiwi Extract Anaphylaxis  . Tdap  [Tetanus-Diphth-Acell Pertussis] Swelling and Other (See Comments)    Swelling at injection site, gets very hot  . Statins Other (See Comments)    RHABDOMYOLYSIS  . Latex Itching, Dermatitis and Rash  . Tramadol Nausea And Vomiting        The results of significant diagnostics from this hospitalization (including imaging, microbiology, ancillary and laboratory) are listed below for reference.     Microbiology: Recent Results (from the past 240 hour(s))  SARS CORONAVIRUS 2 (TAT 6-24 HRS) Nasopharyngeal Nasopharyngeal Swab     Status: None   Collection Time: 05/15/19  1:51 PM   Specimen: Nasopharyngeal Swab  Result Value Ref Range Status   SARS Coronavirus 2 NEGATIVE NEGATIVE Final    Comment: (NOTE) SARS-CoV-2 target nucleic acids are NOT DETECTED. The SARS-CoV-2 RNA is generally detectable in upper and lower respiratory specimens during the acute phase of infection. Negative results do not preclude SARS-CoV-2 infection, do not rule out co-infections with other pathogens, and should not be used as the sole basis for treatment or other patient management decisions. Negative results must be combined with clinical observations, patient history, and epidemiological information. The expected result is Negative. Fact Sheet for Patients: SugarRoll.be Fact Sheet for Healthcare Providers: https://www.woods-mathews.com/ This test is not yet approved or cleared by the [place] FDA and  has been authorized for detection and/or diagnosis of SARS-CoV-2 by FDA under an Emergency Use Authorization (EUA). This EUA will remain  in effect (meaning this test can be used) for the duration of the COVID-19 declaration under Section 56 4(b)(1) of the Act, 21 U.S.C. section 360bbb-3(b)(1), unless the authorization is terminated or revoked sooner. Performed at [facility], [place] [address] [place], [place] [address]      Labs: BNP (last 3  results) No results for input(s): BNP in the last 8760 hours. Basic Metabolic Panel: Recent Labs  Lab 05/13/19 1436 05/15/19 1042 05/15/19 1056 05/16/19 0719 05/17/19 0319 05/18/19 0450  NA 133* 131* 133* 133* 134* 131*  K 4.5 4.6 4.6 4.0 4.1 3.9  CL 96* 97*  --  101 104 101  CO2 24 23  --  24 22 19*  GLUCOSE 269* 138*  --  155* 168* 293*  BUN 19 20  --  15 13 12   CREATININE 1.05* 1.13*  --  0.94 0.89 0.79  CALCIUM 11.1* 10.0  --  9.3 9.1 9.1   Liver Function Tests: Recent Labs  Lab 05/13/19 1436 05/15/19 1042 05/16/19 0719  AST 58* 62* 58*  ALT 41* 45* 43  ALKPHOS  --  127* 104  BILITOT 3.5* 3.4*  4.2*  PROT 7.8 7.2 6.6  ALBUMIN  --  3.0* 2.6*   Recent Labs  Lab 05/15/19 1042  LIPASE 29   Recent Labs  Lab 05/13/19 1436 05/15/19 1042 05/18/19 1456  AMMONIA 102* 111* 39*   CBC: Recent Labs  Lab 05/13/19 1436 05/15/19 1042 05/15/19 1056 05/16/19 0719 05/17/19 0319 05/18/19 0450  WBC 10.4 7.7  --  6.2 5.4 5.5  NEUTROABS 8,299* 5.9  --   --   --   --   HGB 16.1* 14.9 15.0 14.1 13.4 13.3  HCT 47.2* 43.4 44.0 41.6 40.2 39.8  MCV 92.5 94.6  --  95.6 95.7 96.1  PLT 151 116*  --  99* 86* 86*   Cardiac Enzymes: No results for input(s): CKTOTAL, CKMB, CKMBINDEX, TROPONINI in the last 168 hours. BNP: Invalid input(s): POCBNP CBG: Recent Labs  Lab 05/17/19 1214 05/17/19 1806 05/17/19 2155 05/18/19 1005 05/18/19 1336  GLUCAP 266* 170* 222* 301* 233*   D-Dimer No results for input(s): DDIMER in the last 72 hours. Hgb A1c No results for input(s): HGBA1C in the last 72 hours. Lipid Profile No results for input(s): CHOL, HDL, LDLCALC, TRIG, CHOLHDL, LDLDIRECT in the last 72 hours. Thyroid function studies No results for input(s): TSH, T4TOTAL, T3FREE, THYROIDAB in the last 72 hours.  Invalid input(s): FREET3 Anemia work up No results for input(s): VITAMINB12, FOLATE, FERRITIN, TIBC, IRON, RETICCTPCT in the last 72 hours. Urinalysis    Component  Value Date/Time   COLORURINE YELLOW 05/15/2019 1351   APPEARANCEUR HAZY (A) 05/15/2019 1351   LABSPEC 1.014 05/15/2019 1351   PHURINE 6.0 05/15/2019 1351   GLUCOSEU >=500 (A) 05/15/2019 1351   HGBUR NEGATIVE 05/15/2019 1351   BILIRUBINUR NEGATIVE 05/15/2019 1351   BILIRUBINUR neg 05/05/2012 1445   KETONESUR NEGATIVE 05/15/2019 1351   PROTEINUR NEGATIVE 05/15/2019 1351   UROBILINOGEN 0.2 05/05/2012 1445   UROBILINOGEN 0.2 12/18/2011 1409   NITRITE NEGATIVE 05/15/2019 1351   LEUKOCYTESUR MODERATE (A) 05/15/2019 1351   Sepsis Labs Invalid input(s): PROCALCITONIN,  WBC,  LACTICIDVEN Microbiology Recent Results (from the past 240 hour(s))  SARS CORONAVIRUS 2 (TAT 6-24 HRS) Nasopharyngeal Nasopharyngeal Swab     Status: None   Collection Time: 05/15/19  1:51 PM   Specimen: Nasopharyngeal Swab  Result Value Ref Range Status   SARS Coronavirus 2 NEGATIVE NEGATIVE Final    Comment: (NOTE) SARS-CoV-2 target nucleic acids are NOT DETECTED. The SARS-CoV-2 RNA is generally detectable in upper and lower respiratory specimens during the acute phase of infection. Negative results do not preclude SARS-CoV-2 infection, do not rule out co-infections with other pathogens, and should not be used as the sole basis for treatment or other patient management decisions. Negative results must be combined with clinical observations, patient history, and epidemiological information. The expected result is Negative. Fact Sheet for Patients: SugarRoll.be Fact Sheet for Healthcare Providers: https://www.woods-mathews.com/ This test is not yet approved or cleared by the [place] FDA and  has been authorized for detection and/or diagnosis of SARS-CoV-2 by FDA under an Emergency Use Authorization (EUA). This EUA will remain  in effect (meaning this test can be used) for the duration of the COVID-19 declaration under Section 56 4(b)(1) of the Act, 21  U.S.C. section 360bbb-3(b)(1), unless the authorization is terminated or revoked sooner. Performed at [facility], [place] [address] [place], [place] [address]      Time coordinating discharge: Over 30 minutes  SIGNED:   Desiree Hane, MD  [facility] 05/18/2019,  4:09 PM Pager   If 7PM-7AM, please contact night-coverage [email or web address] Password [email or web address]

## 2019-05-18 NOTE — Evaluation (Signed)
Physical Therapy Evaluation Patient Details Name: [name] MRN: [id number] DOB: 1945-07-20 Today's Date: 05/18/2019   History of Present Illness  74 yo admitted with AMS, weakness and impaired gait with elevated ammonia, hepatic encephalopathy and UTI. PMHx. NASH cirrhosis, HTN, DM, CAD, CABG  Clinical Impression  Pt very pleasant and reports still feeling "like I'm crazy" but much improved since lactulose. Pt with generalized weakness bil LE, decreased activity tolerance and recent fall who will benefit from acute therapy to maximize mobility, gait, safety and independence to decrease burden of care and fall risk.     Follow Up Recommendations Home health PT    Equipment Recommendations  None recommended by PT    Recommendations for Other Services       Precautions / Restrictions Precautions Precautions: Fall      Mobility  Bed Mobility Overal bed mobility: Needs Assistance Bed Mobility: Supine to Sit     Supine to sit: Supervision;HOB elevated     General bed mobility comments: supervision for safety with HOB 25 degrees  Transfers Overall transfer level: Needs assistance   Transfers: Sit to/from Stand Sit to Stand: Min guard         General transfer comment: guarding for safety  Ambulation/Gait Ambulation/Gait assistance: Min guard Gait Distance (Feet): 200 Feet Assistive device: Rolling walker (2 wheeled);None Gait Pattern/deviations: Step-through pattern;Decreased stride length   Gait velocity interpretation: >2.62 ft/sec, indicative of community ambulatory General Gait Details: guarding for gait with pt able to walk with and without RW without LOB, pt with cues for direction and decreased speed  Stairs Stairs: Yes Stairs assistance: Min guard Stair Management: Step to pattern;Forwards;One rail Left Number of Stairs: 4 General stair comments: use of rail with good stability and cues for sequence given sore left hip  Wheelchair Mobility     Modified Rankin (Stroke Patients Only)       Balance Overall balance assessment: Needs assistance   Sitting balance-Leahy Scale: Good       Standing balance-Leahy Scale: Good                 High Level Balance Comments: recent fall             Pertinent Vitals/Pain Pain Assessment: 0-10 Pain Score: 2  Pain Location: left hip Pain Descriptors / Indicators: Aching Pain Intervention(s): Limited activity within patient's tolerance;Monitored during session;Repositioned    Home Living Family/patient expects to be discharged to:: Private residence Living Arrangements: Spouse/significant other Available Help at Discharge: Family;Available 24 hours/day Type of Home: House Home Access: Stairs to enter Entrance Stairs-Rails: Left Entrance Stairs-Number of Steps: 3 Home Layout: One level Home Equipment: Walker - 2 wheels      Prior Function Level of Independence: Independent               Hand Dominance        Extremity/Trunk Assessment   Upper Extremity Assessment Upper Extremity Assessment: Generalized weakness    Lower Extremity Assessment Lower Extremity Assessment: Generalized weakness RLE Deficits / Details: hip flexion 3/5, knee extension 5/5, knee flexion, 4/5 LLE Deficits / Details: hip flexion 3/5, knee extension 5/5, knee flexion, 4/5    Cervical / Trunk Assessment Cervical / Trunk Assessment: (rounded shoulders)  Communication   Communication: HOH  Cognition Arousal/Alertness: Awake/alert Behavior During Therapy: Flat affect Overall Cognitive Status: Impaired/Different from baseline Area of Impairment: Orientation                 Orientation Level: Time  General Comments      Exercises General Exercises - Lower Extremity Long Arc Quad: AROM;Both;10 reps;Seated Hip Flexion/Marching: AROM;Both;Seated;10 reps   Assessment/Plan    PT Assessment Patient needs continued PT services  PT Problem  List Decreased mobility;Decreased activity tolerance;Decreased balance       PT Treatment Interventions Gait training;Balance training;Stair training;Functional mobility training;Therapeutic activities;Patient/family education;Therapeutic exercise;DME instruction    PT Goals (Current goals can be found in the Care Plan section)  Acute Rehab PT Goals Patient Stated Goal: return home PT Goal Formulation: With patient Time For Goal Achievement: 05/25/19 Potential to Achieve Goals: Good    Frequency Min 3X/week   Barriers to discharge        Co-evaluation               AM-PAC PT "6 Clicks" Mobility  Outcome Measure Help needed turning from your back to your side while in a flat bed without using bedrails?: A Little Help needed moving from lying on your back to sitting on the side of a flat bed without using bedrails?: A Little Help needed moving to and from a bed to a chair (including a wheelchair)?: A Little Help needed standing up from a chair using your arms (e.g., wheelchair or bedside chair)?: A Little Help needed to walk in hospital room?: A Little Help needed climbing 3-5 steps with a railing? : A Little 6 Click Score: 18    End of Session   Activity Tolerance: Patient tolerated treatment well Patient left: in chair;with call bell/phone within reach;with chair alarm set Nurse Communication: Mobility status PT Visit Diagnosis: Difficulty in walking, not elsewhere classified (R26.2);Other abnormalities of gait and mobility (R26.89)    Time: 0102-7253 PT Time Calculation (min) (ACUTE ONLY): 21 min   Charges:   PT Evaluation $PT Eval Moderate Complexity: 1 Mod          [place], PT Acute Rehabilitation Services Pager: [phone] Office: [place] 05/18/2019, 9:07 AM

## 2019-05-18 NOTE — Care Management (Signed)
Active w [facility], placed order for PT and RN. Notified Liaison of DC.

## 2019-05-20 ENCOUNTER — Encounter: Payer: Self-pay | Admitting: Internal Medicine

## 2019-05-20 ENCOUNTER — Other Ambulatory Visit: Payer: Self-pay | Admitting: *Deleted

## 2019-05-20 NOTE — Patient Outreach (Signed)
[facility]) Care Management  05/20/2019  [name] 14-Oct-1945 [id number]   Noted that member was admitted to hospital 1/6-1/9 with diagnosis of hepatic encephalopathy.  Primary MD office will complete transition of care assessment.  Call placed to member, she report she is doing "pretty good."  Report she is no longer having problems with constipation, state her son and daughter in law has made some changes in her diet that has her having bowel movements more frequently.  Her Lactulose has also been increased to 4 times a day and she has also been prescribed Rifaximin.  However Rifaximin was not available at the pharmacy, will not be ready until tomorrow.  She report some soreness in her hip as she fell prior to hospitalization due to altered mental status, denies fracture.  [facility] today for a visit, however she told them today wasn't a good day.  Her son has contact information for agency and will call to schedule visit.  She has follow up appointment with PCP on 1/25 and with GI on 2/9, husband will provide transportation.  Denies any urgent concerns, will follow up within the next 2 weeks.  THN CM Care Plan Problem One     Most Recent Value  Care Plan Problem One  Risk for readmission related to bleeding varicies as evidenced by Recent hospitalizations  Role Documenting the Problem One  Care Management [place] for Problem One  Active  THN Long Term Goal   Member will not be readmitted to hospital within the next 31 days  THN Long Term Goal Start Date  05/20/19  Interventions for Problem One Long Term Goal  Most recent discharge instructions reviewed with member and son. MD appointments reviewed with member.  THN CM Short Term Goal #1   Member will report taking medicatiosn as prescribed over the next 2 weeks  THN CM Short Term Goal #1 Start Date  05/20/19  Interventions for Short Term Goal #1  Medications reviewed with member and son,  educated on importance of obtaining all medications from pharmacy, particularly rifaximin to help manage encephalopathy   THN CM Short Term Goal #2   Member will report increased strength over the next 2 weeks  THN CM Short Term Goal #2 Start Date  05/14/19  Interventions for Short Term Goal #2  Provided son with contact information for [place] to call to schedule home visit     [name], [place], MSN [place] [phone]

## 2019-05-21 ENCOUNTER — Telehealth: Payer: Self-pay | Admitting: *Deleted

## 2019-05-21 ENCOUNTER — Telehealth: Payer: Self-pay | Admitting: Gastroenterology

## 2019-05-21 DIAGNOSIS — I81 Portal vein thrombosis: Secondary | ICD-10-CM | POA: Diagnosis not present

## 2019-05-21 DIAGNOSIS — I8501 Esophageal varices with bleeding: Secondary | ICD-10-CM | POA: Diagnosis not present

## 2019-05-21 DIAGNOSIS — K7581 Nonalcoholic steatohepatitis (NASH): Secondary | ICD-10-CM | POA: Diagnosis not present

## 2019-05-21 DIAGNOSIS — K3189 Other diseases of stomach and duodenum: Secondary | ICD-10-CM | POA: Diagnosis not present

## 2019-05-21 DIAGNOSIS — K7469 Other cirrhosis of liver: Secondary | ICD-10-CM | POA: Diagnosis not present

## 2019-05-21 DIAGNOSIS — I2510 Atherosclerotic heart disease of native coronary artery without angina pectoris: Secondary | ICD-10-CM | POA: Diagnosis not present

## 2019-05-21 NOTE — Telephone Encounter (Signed)
Pt's son [name] called asking for samples of Xifaxan. He stated that pt was d/c from the hospital recently and was prescribed that medication by the MD who saw her there. Pt's PCP is trying to get refills for pt but advised [name] to call us to get samples in the meantime so pt is not out of medication. Pls call him at [phone].

## 2019-05-21 NOTE — Telephone Encounter (Signed)
Samples of xifaxan  will be left at the front desk for the pt son to pick up.  He has been advised.

## 2019-05-21 NOTE — Telephone Encounter (Signed)
I have made the 1st attempt to contact the patient or family member in charge, in order to follow up from recently being discharged from the hospital. I left a message on voicemail but I will make another attempt at a different time.  

## 2019-05-22 NOTE — Telephone Encounter (Signed)
Husband called and scheduled follow-up visit, he stated the family gave pt some salmon for dinner last night and thinks that may be causing the vomiting, he stated pt was currently only eating soup and salads.

## 2019-05-22 NOTE — Telephone Encounter (Signed)
Patient daughter, Jeannett Senior called and left message on Clinical intake stating that patient is having vomiting x 3. No Diarrhea and no fever and wanted advise.   Tried calling daughter back and phone number rings busy. Will try again later.

## 2019-05-23 ENCOUNTER — Other Ambulatory Visit: Payer: Self-pay

## 2019-05-23 ENCOUNTER — Telehealth (INDEPENDENT_AMBULATORY_CARE_PROVIDER_SITE_OTHER): Payer: Medicare Other | Admitting: Internal Medicine

## 2019-05-23 DIAGNOSIS — Z794 Long term (current) use of insulin: Secondary | ICD-10-CM | POA: Diagnosis not present

## 2019-05-23 DIAGNOSIS — E1165 Type 2 diabetes mellitus with hyperglycemia: Secondary | ICD-10-CM

## 2019-05-23 DIAGNOSIS — K7290 Hepatic failure, unspecified without coma: Secondary | ICD-10-CM

## 2019-05-23 DIAGNOSIS — Z7189 Other specified counseling: Secondary | ICD-10-CM

## 2019-05-23 DIAGNOSIS — I2510 Atherosclerotic heart disease of native coronary artery without angina pectoris: Secondary | ICD-10-CM | POA: Diagnosis not present

## 2019-05-23 DIAGNOSIS — R54 Age-related physical debility: Secondary | ICD-10-CM | POA: Diagnosis not present

## 2019-05-23 DIAGNOSIS — K7581 Nonalcoholic steatohepatitis (NASH): Secondary | ICD-10-CM | POA: Diagnosis not present

## 2019-05-23 DIAGNOSIS — K3189 Other diseases of stomach and duodenum: Secondary | ICD-10-CM | POA: Diagnosis not present

## 2019-05-23 DIAGNOSIS — K7682 Hepatic encephalopathy: Secondary | ICD-10-CM

## 2019-05-23 DIAGNOSIS — E1169 Type 2 diabetes mellitus with other specified complication: Secondary | ICD-10-CM | POA: Diagnosis not present

## 2019-05-23 DIAGNOSIS — K7469 Other cirrhosis of liver: Secondary | ICD-10-CM | POA: Diagnosis not present

## 2019-05-23 DIAGNOSIS — R112 Nausea with vomiting, unspecified: Secondary | ICD-10-CM

## 2019-05-23 DIAGNOSIS — K7460 Unspecified cirrhosis of liver: Secondary | ICD-10-CM

## 2019-05-23 DIAGNOSIS — E785 Hyperlipidemia, unspecified: Secondary | ICD-10-CM

## 2019-05-23 DIAGNOSIS — R188 Other ascites: Secondary | ICD-10-CM

## 2019-05-23 DIAGNOSIS — I81 Portal vein thrombosis: Secondary | ICD-10-CM | POA: Diagnosis not present

## 2019-05-23 DIAGNOSIS — I8501 Esophageal varices with bleeding: Secondary | ICD-10-CM | POA: Diagnosis not present

## 2019-05-23 NOTE — Progress Notes (Signed)
Virtual Visit via Video Note  I connected with [name] on 05/23/19 at  1:30 PM EST by a video enabled telemedicine application and verified that I am speaking with the correct person using two identifiers.  I also spoke with her son, [name], who was there with her.  Her husband was milling around in the background.  Location: Patient: home  Provider: [facility] clinic   I discussed the limitations of evaluation and management by telemedicine and the availability of in person appointments. The patient expressed understanding and agreed to proceed.  History of Present Illness: She vomited yesterday about 4x beginning Tuesday night.  No diarrhea.  Last BM was Monday.     Her last episode of vomiting was last night.  She was eating a sandwich for lunch.  She's having trouble walking today.  She had been walking back and forth to the bathroom and bed, but yesterday, she could not do that as well.  Stomach feels ok. Feels yucky in her throat.  Not painful.    [name] is saying this is to get the family on the same page.   We addressed several concerns and came up with somewhat of a general care plan for her to try to keep her at home with home health and family assistance, close monitoring by me and GI outpatient.  Observations/Objective: Appears weak, some scleral icterus and jaundice, but not nearly like her last in-person visit before her hospitalization Is awake, alert and answering appropriately, just slowly and quietly  Assessment and Plan: 1. Cirrhosis of liver with ascites, unspecified hepatic cirrhosis type [facility]) -family is just starting to get involved with two sons being more supportive after they learned how ill their mother is -note that patient is taking her aldactone and protonix just once a day  2. Hepatic encephalopathy (HCC) -continue lactulose 30g qid and xifaxan 576m po bid -samples were provided by GI fortunately b/c we don't have any -we did prior auth and cost after  insurance is $996 which is completely absurd  -sons will try to get her set up with a support program but I'm concerned she won't qualify if her husband is still working at the dealership -she seems clearer today--sons instructed to monitor her mentation for clarity, alertness, and her BMs to try to have 2-3 loose stools per day, but this will be affected if she does not have enough intake -did encourage 6 8oz glasses of water rather than other caffeinated drinks or soda -advised that if she has persistent vomiting, her diuretic should be held and we should be notified   3. Controlled type 2 diabetes mellitus with hyperglycemia, with long-term current use of insulin (HCC) -is using 50 units of insulin not 20 units due to her high sugars -intake poor, is back on jardiance 240mdaily also -monitor renal function carefully and for lows -sons are helping get both of their parents' diets in check with a heart healthy diabetic diet at this point  4. Frailty -seems to be setting in amid her multiple chronic illnesses -hoping family stepping in will provide enough support to keep her out of the hospital and onto a routine; however, my concern is that we may not be able to prevent her liver disease from progressing at this point  -cont home health PT, OT, and nursing support--Chris requests that he be notified in the event that home health is stopping coming out rather than just telling his parents   5.38Non-intractable vomiting with nausea,  unspecified vomiting type -may use zofran as ordered -avoid foods that seem to trigger this (fish)  6. ACP (advance care planning) -sons brought her completed living will and hcpoa documents which do indicate that if she has an incurable illness that will result in her death in a relatively short time, she does not want her life prolonged and similarly if she is in a vegetative state--it requests we follow her document over her health care agent which is first [name]  her husband and then Byron her son -we will discuss code status when she is here in person next 20 mins spent on ACP portion of visit  Follow Up Instructions: Keep appt 06/03/2019 as scheduled, call back prn as described above.   I discussed the assessment and treatment plan with the patient. The patient was provided an opportunity to ask questions and all were answered. The patient agreed with the plan and demonstrated an understanding of the instructions.   The patient was advised to call back or seek an in-person evaluation if the symptoms worsen or if the condition fails to improve as anticipated.  I provided 60 minutes of non-face-to-face time during this encounter.  Berma Harts L. Destyn Schuyler, D.O. [facility] [address],  [address] Cell Phone (Mon-Fri 8am-5pm):  [phone] On Call:  [phone] & follow prompts after 5pm & weekends Office Phone:  [phone] Office Fax:  [phone]

## 2019-05-23 NOTE — Progress Notes (Signed)
Patient ID: RAPHAEL ESPE, female   DOB: 30-Jan-1946, 74 y.o.   MRN: [id number]  This service is provided via telemedicine  No vital signs collected/recorded due to the encounter was a telemedicine visit.   Location of patient (ex: home, work):home   Patient consents to a telephone visit:  yes Location of the provider (ex: office, home): [facility] Name of any referring provider:  Dr. Mariea Clonts DO  Names of all persons participating in the telemedicine service and their role in the encounter:  Charlene, CMA, Dr. Mariea Clonts DO and Patient  Time spent on call:  10 mins

## 2019-05-24 ENCOUNTER — Encounter: Payer: Self-pay | Admitting: *Deleted

## 2019-05-24 ENCOUNTER — Encounter: Payer: Self-pay | Admitting: Internal Medicine

## 2019-05-24 DIAGNOSIS — K7581 Nonalcoholic steatohepatitis (NASH): Secondary | ICD-10-CM | POA: Diagnosis not present

## 2019-05-24 DIAGNOSIS — I8501 Esophageal varices with bleeding: Secondary | ICD-10-CM | POA: Diagnosis not present

## 2019-05-24 DIAGNOSIS — I81 Portal vein thrombosis: Secondary | ICD-10-CM | POA: Diagnosis not present

## 2019-05-24 DIAGNOSIS — I2510 Atherosclerotic heart disease of native coronary artery without angina pectoris: Secondary | ICD-10-CM | POA: Diagnosis not present

## 2019-05-24 DIAGNOSIS — K3189 Other diseases of stomach and duodenum: Secondary | ICD-10-CM | POA: Diagnosis not present

## 2019-05-24 DIAGNOSIS — K7469 Other cirrhosis of liver: Secondary | ICD-10-CM | POA: Diagnosis not present

## 2019-05-24 MED ORDER — PANTOPRAZOLE SODIUM 40 MG PO TBEC: 40.0000 mg | DELAYED_RELEASE_TABLET | Freq: Every day | ORAL | 3 refills | Status: DC

## 2019-05-24 MED ORDER — EZETIMIBE 10 MG PO TABS: ORAL_TABLET | ORAL | 1 refills | Status: DC

## 2019-05-24 MED ORDER — INSULIN DETEMIR 100 UNIT/ML ~~LOC~~ SOLN: 50.0000 [IU] | Freq: Every day | SUBCUTANEOUS | Status: DC

## 2019-05-24 MED ORDER — SPIRONOLACTONE 50 MG PO TABS: 50.0000 mg | ORAL_TABLET | Freq: Every day | ORAL | Status: DC

## 2019-05-24 NOTE — Telephone Encounter (Signed)
Dr.Reed please provide any additional comments if necessary

## 2019-05-26 ENCOUNTER — Inpatient Hospital Stay (HOSPITAL_COMMUNITY)
Admission: EM | Admit: 2019-05-26 | Discharge: 2019-06-01 | DRG: 682 | Disposition: A | Discharge status: Home Health | Payer: Medicare Other | Attending: Internal Medicine | Admitting: Internal Medicine

## 2019-05-26 ENCOUNTER — Emergency Department (HOSPITAL_COMMUNITY): Payer: Medicare Other

## 2019-05-26 ENCOUNTER — Other Ambulatory Visit: Payer: Self-pay

## 2019-05-26 ENCOUNTER — Encounter (HOSPITAL_COMMUNITY): Payer: Self-pay | Admitting: Emergency Medicine

## 2019-05-26 DIAGNOSIS — E119 Type 2 diabetes mellitus without complications: Secondary | ICD-10-CM | POA: Diagnosis not present

## 2019-05-26 DIAGNOSIS — E1165 Type 2 diabetes mellitus with hyperglycemia: Secondary | ICD-10-CM

## 2019-05-26 DIAGNOSIS — I10 Essential (primary) hypertension: Secondary | ICD-10-CM | POA: Diagnosis present

## 2019-05-26 DIAGNOSIS — K7460 Unspecified cirrhosis of liver: Secondary | ICD-10-CM | POA: Diagnosis not present

## 2019-05-26 DIAGNOSIS — G9341 Metabolic encephalopathy: Secondary | ICD-10-CM | POA: Diagnosis present

## 2019-05-26 DIAGNOSIS — E43 Unspecified severe protein-calorie malnutrition: Secondary | ICD-10-CM | POA: Diagnosis present

## 2019-05-26 DIAGNOSIS — K7210 Chronic hepatic failure without coma: Secondary | ICD-10-CM

## 2019-05-26 DIAGNOSIS — K7290 Hepatic failure, unspecified without coma: Secondary | ICD-10-CM | POA: Diagnosis not present

## 2019-05-26 DIAGNOSIS — D693 Immune thrombocytopenic purpura: Secondary | ICD-10-CM | POA: Diagnosis not present

## 2019-05-26 DIAGNOSIS — Z20822 Contact with and (suspected) exposure to covid-19: Secondary | ICD-10-CM | POA: Diagnosis present

## 2019-05-26 DIAGNOSIS — Z803 Family history of malignant neoplasm of breast: Secondary | ICD-10-CM

## 2019-05-26 DIAGNOSIS — N179 Acute kidney failure, unspecified: Principal | ICD-10-CM | POA: Insufficient documentation

## 2019-05-26 DIAGNOSIS — E876 Hypokalemia: Secondary | ICD-10-CM | POA: Diagnosis not present

## 2019-05-26 DIAGNOSIS — Z794 Long term (current) use of insulin: Secondary | ICD-10-CM

## 2019-05-26 DIAGNOSIS — R188 Other ascites: Secondary | ICD-10-CM | POA: Diagnosis present

## 2019-05-26 DIAGNOSIS — K5900 Constipation, unspecified: Secondary | ICD-10-CM | POA: Diagnosis not present

## 2019-05-26 DIAGNOSIS — E860 Dehydration: Secondary | ICD-10-CM

## 2019-05-26 DIAGNOSIS — K7689 Other specified diseases of liver: Secondary | ICD-10-CM | POA: Diagnosis not present

## 2019-05-26 DIAGNOSIS — B9689 Other specified bacterial agents as the cause of diseases classified elsewhere: Secondary | ICD-10-CM | POA: Diagnosis present

## 2019-05-26 DIAGNOSIS — Z95828 Presence of other vascular implants and grafts: Secondary | ICD-10-CM

## 2019-05-26 DIAGNOSIS — N390 Urinary tract infection, site not specified: Secondary | ICD-10-CM | POA: Diagnosis not present

## 2019-05-26 DIAGNOSIS — E861 Hypovolemia: Secondary | ICD-10-CM | POA: Diagnosis present

## 2019-05-26 DIAGNOSIS — D696 Thrombocytopenia, unspecified: Secondary | ICD-10-CM | POA: Diagnosis present

## 2019-05-26 DIAGNOSIS — R160 Hepatomegaly, not elsewhere classified: Secondary | ICD-10-CM | POA: Diagnosis not present

## 2019-05-26 DIAGNOSIS — Z951 Presence of aortocoronary bypass graft: Secondary | ICD-10-CM

## 2019-05-26 DIAGNOSIS — E871 Hypo-osmolality and hyponatremia: Secondary | ICD-10-CM | POA: Diagnosis present

## 2019-05-26 DIAGNOSIS — Z801 Family history of malignant neoplasm of trachea, bronchus and lung: Secondary | ICD-10-CM

## 2019-05-26 DIAGNOSIS — Z8249 Family history of ischemic heart disease and other diseases of the circulatory system: Secondary | ICD-10-CM

## 2019-05-26 DIAGNOSIS — I2510 Atherosclerotic heart disease of native coronary artery without angina pectoris: Secondary | ICD-10-CM | POA: Diagnosis not present

## 2019-05-26 DIAGNOSIS — K7682 Hepatic encephalopathy: Secondary | ICD-10-CM

## 2019-05-26 DIAGNOSIS — Z79899 Other long term (current) drug therapy: Secondary | ICD-10-CM

## 2019-05-26 DIAGNOSIS — I8511 Secondary esophageal varices with bleeding: Secondary | ICD-10-CM | POA: Diagnosis present

## 2019-05-26 DIAGNOSIS — K7030 Alcoholic cirrhosis of liver without ascites: Secondary | ICD-10-CM | POA: Diagnosis not present

## 2019-05-26 DIAGNOSIS — R531 Weakness: Secondary | ICD-10-CM | POA: Diagnosis not present

## 2019-05-26 DIAGNOSIS — R112 Nausea with vomiting, unspecified: Secondary | ICD-10-CM | POA: Diagnosis not present

## 2019-05-26 HISTORY — DX: Hepatic encephalopathy: K76.82

## 2019-05-26 HISTORY — DX: Hepatic failure, unspecified without coma: K72.90

## 2019-05-26 LAB — COMPREHENSIVE METABOLIC PANEL
ALT: 45 U/L — ABNORMAL HIGH (ref 0–44)
AST: 60 U/L — ABNORMAL HIGH (ref 15–41)
Albumin: 2.8 g/dL — ABNORMAL LOW (ref 3.5–5.0)
Alkaline Phosphatase: 190 U/L — ABNORMAL HIGH (ref 38–126)
Anion gap: 11 (ref 5–15)
BUN: 22 mg/dL (ref 8–23)
CO2: 27 mmol/L (ref 22–32)
Calcium: 13.4 mg/dL (ref 8.9–10.3)
Chloride: 92 mmol/L — ABNORMAL LOW (ref 98–111)
Creatinine, Ser: 1.51 mg/dL — ABNORMAL HIGH (ref 0.44–1.00)
GFR calc Af Amer: 40 mL/min — ABNORMAL LOW (ref >60)
GFR calc non Af Amer: 34 mL/min — ABNORMAL LOW (ref >60)
Glucose, Bld: 206 mg/dL — ABNORMAL HIGH (ref 70–99)
Potassium: 4.2 mmol/L (ref 3.5–5.1)
Sodium: 130 mmol/L — ABNORMAL LOW (ref 135–145)
Total Bilirubin: 3.1 mg/dL — ABNORMAL HIGH (ref 0.3–1.2)
Total Protein: 7.4 g/dL (ref 6.5–8.1)

## 2019-05-26 LAB — URINALYSIS, ROUTINE W REFLEX MICROSCOPIC
Bilirubin Urine: NEGATIVE
Glucose, UA: >=500 mg/dL — AB
Hgb urine dipstick: NEGATIVE
Ketones, ur: NEGATIVE mg/dL
Nitrite: NEGATIVE
Protein, ur: NEGATIVE mg/dL
Specific Gravity, Urine: 1.018 (ref 1.005–1.030)
WBC, UA: >50 WBC/hpf — ABNORMAL HIGH (ref 0–5)
pH: 6 (ref 5.0–8.0)

## 2019-05-26 LAB — MAGNESIUM: Magnesium: 2.7 mg/dL — ABNORMAL HIGH (ref 1.7–2.4)

## 2019-05-26 LAB — RESPIRATORY PANEL BY RT PCR (FLU A&B, COVID)
Influenza A by PCR: NEGATIVE
Influenza B by PCR: NEGATIVE
SARS Coronavirus 2 by RT PCR: NEGATIVE

## 2019-05-26 LAB — CBC
HCT: 45.3 % (ref 36.0–46.0)
Hemoglobin: 15 g/dL (ref 12.0–15.0)
MCH: 32.8 pg (ref 26.0–34.0)
MCHC: 33.1 g/dL (ref 30.0–36.0)
MCV: 98.9 fL (ref 80.0–100.0)
Platelets: 133 10*3/uL — ABNORMAL LOW (ref 150–400)
RBC: 4.58 MIL/uL (ref 3.87–5.11)
RDW: 16.1 % — ABNORMAL HIGH (ref 11.5–15.5)
WBC: 8.8 10*3/uL (ref 4.0–10.5)
nRBC: 0 % (ref 0.0–0.2)

## 2019-05-26 LAB — CBG MONITORING, ED
Glucose-Capillary: 185 mg/dL — ABNORMAL HIGH (ref 70–99)
Glucose-Capillary: 199 mg/dL — ABNORMAL HIGH (ref 70–99)

## 2019-05-26 LAB — AMMONIA: Ammonia: 41 umol/L — ABNORMAL HIGH (ref 9–35)

## 2019-05-26 LAB — LIPASE, BLOOD: Lipase: 25 U/L (ref 11–51)

## 2019-05-26 MED ORDER — LORATADINE 10 MG PO TABS: 10.0000 mg | ORAL_TABLET | Freq: Every day | ORAL | Status: DC | PRN

## 2019-05-26 MED ORDER — ACETAMINOPHEN 500 MG PO TABS
500.0000 mg | ORAL_TABLET | Freq: Every day | ORAL | Status: DC
  Administered 2019-05-27 – 2019-05-31 (×6): 500 mg via ORAL
  Filled 2019-05-26 (×6): qty 1

## 2019-05-26 MED ORDER — EZETIMIBE 10 MG PO TABS
10.0000 mg | ORAL_TABLET | Freq: Every day | ORAL | Status: DC
  Administered 2019-05-27 – 2019-06-01 (×6): 10 mg via ORAL
  Filled 2019-05-26 (×7): qty 1

## 2019-05-26 MED ORDER — SODIUM CHLORIDE 0.9 % IV SOLN: INTRAVENOUS | Status: AC

## 2019-05-26 MED ORDER — SODIUM CHLORIDE 0.9 % IV SOLN
1.0000 g | Freq: Once | INTRAVENOUS | Status: AC
  Administered 2019-05-26: 1 g via INTRAVENOUS
  Filled 2019-05-26: qty 10

## 2019-05-26 MED ORDER — ONDANSETRON HCL 4 MG PO TABS: 4.0000 mg | ORAL_TABLET | Freq: Four times a day (QID) | ORAL | Status: DC | PRN

## 2019-05-26 MED ORDER — BIOTIN 10000 MCG PO TABS: 10000.0000 ug | ORAL_TABLET | ORAL | Status: DC

## 2019-05-26 MED ORDER — POLYVINYL ALCOHOL 1.4 % OP SOLN
Freq: Every day | OPHTHALMIC | Status: DC
  Filled 2019-05-26: qty 15

## 2019-05-26 MED ORDER — SPIRONOLACTONE 25 MG PO TABS
50.0000 mg | ORAL_TABLET | Freq: Every day | ORAL | Status: DC
  Administered 2019-05-27: 50 mg via ORAL
  Filled 2019-05-26: qty 2

## 2019-05-26 MED ORDER — RISAQUAD PO CAPS
1.0000 | ORAL_CAPSULE | Freq: Every day | ORAL | Status: DC
  Administered 2019-05-27 – 2019-06-01 (×6): 1 via ORAL
  Filled 2019-05-26 (×6): qty 1

## 2019-05-26 MED ORDER — INSULIN ASPART 100 UNIT/ML ~~LOC~~ SOLN
0.0000 [IU] | Freq: Three times a day (TID) | SUBCUTANEOUS | Status: DC
  Administered 2019-05-27: 2 [IU] via SUBCUTANEOUS
  Administered 2019-05-27: 5 [IU] via SUBCUTANEOUS
  Administered 2019-05-27: 2 [IU] via SUBCUTANEOUS
  Administered 2019-05-28: 7 [IU] via SUBCUTANEOUS
  Administered 2019-05-28: 1 [IU] via SUBCUTANEOUS
  Administered 2019-05-28 – 2019-05-29 (×2): 2 [IU] via SUBCUTANEOUS
  Administered 2019-05-29 – 2019-05-30 (×2): 1 [IU] via SUBCUTANEOUS
  Administered 2019-05-30: 2 [IU] via SUBCUTANEOUS
  Administered 2019-05-30: 1 [IU] via SUBCUTANEOUS
  Administered 2019-05-31: 5 [IU] via SUBCUTANEOUS
  Administered 2019-05-31 – 2019-06-01 (×4): 3 [IU] via SUBCUTANEOUS

## 2019-05-26 MED ORDER — RIFAXIMIN 550 MG PO TABS
550.0000 mg | ORAL_TABLET | Freq: Two times a day (BID) | ORAL | Status: DC
  Administered 2019-05-27 – 2019-06-01 (×12): 550 mg via ORAL
  Filled 2019-05-26 (×14): qty 1

## 2019-05-26 MED ORDER — PANTOPRAZOLE SODIUM 40 MG PO TBEC
40.0000 mg | DELAYED_RELEASE_TABLET | Freq: Every day | ORAL | Status: DC
  Administered 2019-05-27 – 2019-06-01 (×6): 40 mg via ORAL
  Filled 2019-05-26 (×5): qty 1

## 2019-05-26 MED ORDER — SODIUM CHLORIDE 0.9% FLUSH
3.0000 mL | Freq: Once | INTRAVENOUS | Status: AC
  Administered 2019-05-26: 3 mL via INTRAVENOUS

## 2019-05-26 MED ORDER — LACTULOSE 10 GM/15ML PO SOLN
30.0000 g | Freq: Four times a day (QID) | ORAL | Status: DC
  Administered 2019-05-26 – 2019-06-01 (×20): 30 g via ORAL
  Filled 2019-05-26 (×3): qty 60
  Filled 2019-05-26: qty 45
  Filled 2019-05-26 (×6): qty 60
  Filled 2019-05-26: qty 45
  Filled 2019-05-26 (×9): qty 60

## 2019-05-26 MED ORDER — SODIUM CHLORIDE 0.9 % IV SOLN: INTRAVENOUS | Status: DC

## 2019-05-26 MED ORDER — ONDANSETRON HCL 4 MG/2ML IJ SOLN
4.0000 mg | Freq: Four times a day (QID) | INTRAMUSCULAR | Status: DC | PRN
  Administered 2019-05-26: 4 mg via INTRAVENOUS
  Filled 2019-05-26: qty 2

## 2019-05-26 MED ORDER — FUROSEMIDE 40 MG PO TABS
40.0000 mg | ORAL_TABLET | Freq: Every day | ORAL | Status: DC
  Filled 2019-05-26: qty 2

## 2019-05-26 NOTE — ED Notes (Signed)
ED Provider at bedside. 

## 2019-05-26 NOTE — ED Triage Notes (Signed)
C/o nausea and vomiting since Tuesday that she believes is related to her medication.  Also reports constipation.  Last BM yesterday after a suppository.  Denies pain.  Pt believes she is dehydrated.

## 2019-05-26 NOTE — ED Notes (Addendum)
Calcium level 13.4 per lab.  Dr. Ronnald Nian notified.

## 2019-05-26 NOTE — ED Provider Notes (Signed)
[facility] EMERGENCY DEPARTMENT Provider Note   CSN: [id number] Arrival date & time: 05/26/19  1318     History Chief Complaint  Patient presents with  . Nausea  . Constipation    [name] is a 74 y.o. female.  HPI   This patient is a 74 year old female, she is chronically ill with liver failure and unfortunately has had recurrent esophageal variceal bleeding and required multiple banding procedures and ultimately a TIPS procedure.  The patient is here with her son, he is the primary historian as she is very weak and fatigued.  Level 5 caveat applies secondary to some altered mental status.  He reports that this altered mental status is severe and progressive fatigue, she is now not able to essentially get out of bed without needing assistance.  She is very dry in the mouth, has had nausea and vomiting, he states that this is related to a new medication that she is taking, the Xifaxan after having a TIPS procedure.  There has been decreased urinary output, there is no diarrhea, she is trying to take sips of Gatorade but becoming progressively weak.  According to the medical record which I thoroughly reviewed the patient had a TIPS procedure by interventional radiology on November 30.    Past Medical History:  Diagnosis Date  . Allergy   . Arthritis    neck  . Cataract    bilateral - MD monitoring cataracts  . CHF (congestive heart failure) ([place])   . Chronic kidney disease, stage I    DR OTTELIN  HX UTIS  . Cirrhosis ([place])   . Cramp of limb   . Diabetes mellitus   . Dysphagia, unspecified(787.20)   . Dysuria   . Epistaxis   . GERD (gastroesophageal reflux disease)   . Heart murmur    NO CARDIOLOGIST  DX FOR YEARS ASYMPTOMATIC  . Lumbago   . Neoplasm of uncertain behavior of skin   . Nonspecific elevation of levels of transaminase or lactic acid dehydrogenase (LDH)   . Osteoarthrosis, unspecified whether generalized or localized, unspecified site    . Other and unspecified hyperlipidemia    diet controlled  . Pain in joint, shoulder region   . Paresthesias 04/01/2015  . Postablative ovarian failure   . Trochanteric bursitis of left hip 12/15/2015  . Type 2 diabetes mellitus without complication ([place])   . Unspecified essential hypertension    no meds    Patient Active Problem List   Diagnosis Date Noted  . Acute lower UTI 05/16/2019  . Acute delirium   . General weakness 05/15/2019  . Hepatic encephalopathy ([place]) 05/15/2019  . Volume depletion 05/15/2019  . Bleeding esophageal varices ([place])   . UGI bleed   . Infarction of spleen 04/06/2019  . Odynophagia 04/01/2019  . Dysphagia   . Hepatoma ([place]) 01/31/2019  . Coronary artery disease of native artery of native heart with stable angina pectoris ([place]) 01/31/2019  . Thrombocytopenia ([place]) 09/25/2017  . Left forearm pain 05/29/2017  . Portal vein thrombosis 04/22/2017  . Skin cancer of nose 08/23/2016  . History of colonic polyps   . Benign neoplasm of rectum   . Esophageal varices without bleeding ([place])   . Portal hypertensive gastropathy ([place])   . Leg cramps 05/18/2016  . Routine lab draw 05/13/2016  . Iron deficiency anemia 05/09/2016  . Statin-induced rhabdomyolysis 05/07/2016  . Liver cirrhosis ([place]) 04/05/2016  . PVC's (premature ventricular contractions) 04/05/2016  . CAD (coronary artery disease)  02/16/2016  . S/P CABG x 3 02/02/2016  . History of non-ST elevation myocardial infarction (NSTEMI) 01/27/2016  . DM (diabetes mellitus), type 2, uncontrolled ([place]) 12/15/2015  . Trochanteric bursitis of left hip 12/15/2015  . S/P lumbar spinal fusion 06/10/2015  . Paresthesia 04/01/2015  . Osteoporosis 03/31/2015  . Hearing loss 06/25/2014  . Insomnia 06/25/2014  . Hair thinning 06/25/2014  . Muscle spasm 10/08/2013  . Biceps tendon rupture, proximal 11/24/2012  . Hyperlipemia   . GERD (gastroesophageal reflux disease)   . DM (diabetes mellitus) ([place])   .  Essential hypertension     Past Surgical History:  Procedure Laterality Date  . BREAST BIOPSY    . CARDIAC CATHETERIZATION N/A 01/27/2016   Procedure: Left Heart Cath and Coronary Angiography;  Surgeon: Belva Crome, MD;  Location: [facility];  Service: Cardiovascular;  Laterality: N/A;  . COLONOSCOPY  2012   Dr Lajoyce Corners.   . COLONOSCOPY WITH PROPOFOL N/A 07/07/2016   Procedure: COLONOSCOPY WITH PROPOFOL;  Surgeon: [name], MD;  Location: [facility];  Service: Endoscopy;  Laterality: N/A;  . CORONARY ARTERY BYPASS GRAFT N/A 01/28/2016   Procedure: CORONARY ARTERY BYPASS GRAFTING (CABG) x 3 USING RIGHT LEG GREATER SAPHENOUS VEIN GRAFT;  Surgeon: Melrose Nakayama, MD;  Location: [facility];  Service: Open Heart Surgery;  Laterality: N/A;  . ENDOVEIN HARVEST OF GREATER SAPHENOUS VEIN Right 01/28/2016   Procedure: ENDOVEIN HARVEST OF GREATER SAPHENOUS VEIN;  Surgeon: Melrose Nakayama, MD;  Location: [facility];  Service: Open Heart Surgery;  Laterality: Right;  . ESOPHAGEAL BANDING  03/28/2019   Procedure: ESOPHAGEAL BANDING;  Surgeon: [name], MD;  Location: [facility];  Service: Endoscopy;;  . ESOPHAGEAL BANDING  04/07/2019   Procedure: ESOPHAGEAL BANDING;  Surgeon: Juanita Craver, MD;  Location: [facility] ENDOSCOPY;  Service: Endoscopy;;  . ESOPHAGOGASTRODUODENOSCOPY N/A 04/07/2019   Procedure: ESOPHAGOGASTRODUODENOSCOPY (EGD);  Surgeon: Juanita Craver, MD;  Location: [facility] ENDOSCOPY;  Service: Endoscopy;  Laterality: N/A;  . ESOPHAGOGASTRODUODENOSCOPY (EGD) WITH PROPOFOL N/A 07/07/2016   Procedure: ESOPHAGOGASTRODUODENOSCOPY (EGD) WITH PROPOFOL;  Surgeon: [name], MD;  Location: [facility]OS[facility];  Service: Endoscopy;  Laterality: N/A;  . ESOPHAGOGASTRODUODENOSCOPY (EGD) WITH PROPOFOL N/A 03/28/2019   Procedure: ESOPHAGOGASTRODUODENOSCOPY (EGD) WITH PROPOFOL;  Surgeon: [name], MD;  Location: [facility]OS[facility];  Service: Endoscopy;  Laterality: N/A;  . HEMOSTASIS CLIP PLACEMENT   04/07/2019   Procedure: HEMOSTASIS CLIP PLACEMENT;  Surgeon: Juanita Craver, MD;  Location: [facility] ENDOSCOPY;  Service: Endoscopy;;  . IR ANGIOGRAM SELECTIVE EACH ADDITIONAL VESSEL  04/08/2019  . IR EMBO ART  VEN HEMORR LYMPH EXTRAV  INC GUIDE ROADMAPPING  04/08/2019  . IR PARACENTESIS  04/08/2019  . IR TIPS  04/08/2019  . MAXIMUM ACCESS (MAS)POSTERIOR LUMBAR INTERBODY FUSION (PLIF) 1 LEVEL Left 06/10/2015   Procedure: FOR MAXIMUM ACCESS (MAS) POSTERIOR LUMBAR INTERBODY FUSION (PLIF) LUMBAR THREE-FOUR EXTRAFORAMINAL MICRODISCECTOMY LUMBAR FIVE-SACRAL ONE LEFT;  Surgeon: Eustace Moore, MD;  Location: [facility] NEURO ORS;  Service: Neurosurgery;  Laterality: Left;  . RADIOLOGY WITH ANESTHESIA N/A 04/08/2019   Procedure: RADIOLOGY WITH ANESTHESIA;  Surgeon: Radiologist, Medication, MD;  Location: [facility];  Service: Radiology;  Laterality: N/A;  . SCLEROTHERAPY  04/07/2019   Procedure: SCLEROTHERAPY;  Surgeon: Juanita Craver, MD;  Location: [facility] ENDOSCOPY;  Service: Endoscopy;;  . TEE WITHOUT CARDIOVERSION N/A 01/28/2016   Procedure: TRANSESOPHAGEAL ECHOCARDIOGRAM (TEE);  Surgeon: Melrose Nakayama, MD;  Location: [facility];  Service: Open Heart Surgery;  Laterality: N/A;  . TUBAL  LIGATION  1982   Dr Connye Burkitt  . UPPER GASTROINTESTINAL ENDOSCOPY    . VAGINAL HYSTERECTOMY  1997   Dr Rande Lawman     OB History   No obstetric history on file.     Family History  Problem Relation Age of Onset  . Lung cancer Father   . Arthritis Sister   . Arthritis Brother   . Heart disease Maternal Grandmother   . Heart disease Maternal Grandfather   . Heart disease Paternal Grandmother   . Heart disease Paternal Grandfather   . Breast cancer Mother   . Liver cancer Brother   . Breast cancer Maternal Aunt   . Breast cancer Paternal Aunt   . Colon cancer Neg Hx   . Esophageal cancer Neg Hx   . Rectal cancer Neg Hx   . Stomach cancer Neg Hx     Social History   Tobacco Use  . Smoking status: Never Smoker  . Smokeless  tobacco: Never Used  Substance Use Topics  . Alcohol use: No  . Drug use: No    Home Medications Prior to Admission medications   Medication Sig Start Date End Date Taking? Authorizing Provider  acetaminophen (TYLENOL) 500 MG tablet Take 500 mg by mouth at bedtime.    Yes [provider]  Aromatic Inhalants (VICKS VAPOR IN) Vicks Vapor Rub apply small amount to outside of nose to help breathing   Yes [provider]  BD PEN NEEDLE NANO U/F 32G X 4 MM MISC USE THREE TIMES DAILY AS DIRECTED 11/26/18  Yes Reed, Tiffany L, DO  Biotin 10000 MCG TABS Take 10,000 mcg by mouth every morning.   Yes [provider]  bisacodyl (DULCOLAX) 10 MG suppository Place 1 suppository (10 mg total) rectally daily as needed for moderate constipation. 04/12/19  Yes Aline August, MD  calcium carbonate (OS-CAL) 600 MG TABS Take 600 mg by mouth daily.    Yes [provider]  Cholecalciferol (VITAMIN D) 50 MCG (2000 UT) CAPS Take 2,000 Units by mouth daily.    Yes [provider]  Cyanocobalamin (VITAMIN B 12 PO) Take 1,000 mcg by mouth daily.     Yes [provider]  ezetimibe (ZETIA) 10 MG tablet TAKE 1 TABLET(10 MG) BY MOUTH DAILY 05/24/19  Yes Reed, Tiffany L, DO  furosemide (LASIX) 40 MG tablet Take 40 mg by mouth daily.    Yes [provider]  glucose blood test strip One Touch Ultra II strips. Use to test blood sugar three times daily. Dx: E11.65 06/01/17  Yes Reed, Tiffany L, DO  insulin detemir (LEVEMIR) 100 UNIT/ML injection Inject 0.5 mLs (50 Units total) into the skin at bedtime. 05/24/19  Yes Reed, Tiffany L, DO  Insulin Syringe-Needle U-100 (INSULIN SYRINGE 1CC/31GX5/16") 31G X 5/16" 1 ML MISC USE AS DIRECTED DAILY WITH LEVEMIR 07/27/18  Yes Reed, Tiffany L, DO  JARDIANCE 25 MG TABS tablet Take 25 mg by mouth daily. 04/30/19  Yes [provider]  lactulose (CHRONULAC) 10 GM/15ML solution Take 45 mLs (30 g total) by mouth 4 (four) times  daily. 05/18/19  Yes Oretha Milch D, MD  loratadine (CLARITIN) 10 MG tablet Take 10 mg by mouth daily as needed for allergies.   Yes [provider]  MAGNESIUM PO Take 500 mg by mouth daily.    Yes [provider]  Multiple Vitamins-Minerals (MULTIVITAMIN WITH MINERALS) tablet Take 1 tablet by mouth daily.     Yes [provider]  Tobie Lords  100 UNIT/ML FlexPen Inject 12 units under the skin every morning, 8 units at lunch and 12 units at supper 02/19/19  Yes Reed, Tiffany L, DO  ondansetron (ZOFRAN) 4 MG tablet Take 1 tablet (4 mg total) by mouth every 6 (six) hours as needed for nausea. 05/18/19  Yes Oretha Milch D, MD  pantoprazole (PROTONIX) 40 MG tablet Take 1 tablet (40 mg total) by mouth daily. 05/24/19 06/23/19 Yes Reed, Tiffany L, DO  Polyethyl Glycol-Propyl Glycol (SYSTANE OP) Place 1 drop into both eyes daily.    Yes [provider]  Probiotic Product (PROBIOTIC DAILY PO) Take 1 capsule by mouth daily. Digestive Advantage Probiotic   Yes [provider]  rifaximin (XIFAXAN) 550 MG TABS tablet Take 1 tablet (550 mg total) by mouth 2 (two) times daily. 05/18/19  Yes Oretha Milch D, MD  spironolactone (ALDACTONE) 50 MG tablet Take 1 tablet (50 mg total) by mouth daily. 05/24/19  Yes Reed, Tiffany L, DO    Allergies    Kiwi extract, Tdap [tetanus-diphth-acell pertussis], Statins, Latex, and Tramadol  Review of Systems   Review of Systems  All other systems reviewed and are negative.   Physical Exam Updated Vital Signs BP (!) 118/56 (BP Location: Left Arm)   Pulse 81   Temp 98 F (36.7 C) (Oral)   Resp 18   SpO2 96%   Physical Exam Vitals and nursing note reviewed.  Constitutional:      General: She is not in acute distress.    Appearance: She is well-developed.     Comments: This patient is awake, very weak diffusely but able to follow commands  HENT:     Head: Normocephalic and atraumatic.     Mouth/Throat:     Mouth:  Mucous membranes are dry.     Pharynx: No oropharyngeal exudate.     Comments: Dry mucous membranes Eyes:     General: Scleral icterus present.        Right eye: No discharge.        Left eye: No discharge.     Conjunctiva/sclera: Conjunctivae normal.     Pupils: Pupils are equal, round, and reactive to light.  Neck:     Thyroid: No thyromegaly.     Vascular: No JVD.  Cardiovascular:     Rate and Rhythm: Normal rate and regular rhythm.     Heart sounds: Murmur present. No friction rub. No gallop.      Comments: Occasional ectopy Pulmonary:     Effort: Pulmonary effort is normal. No respiratory distress.     Breath sounds: Normal breath sounds. No wheezing or rales.  Abdominal:     General: Bowel sounds are normal. There is no distension.     Palpations: Abdomen is soft. There is no mass.     Tenderness: There is no abdominal tenderness.  Musculoskeletal:        General: No tenderness. Normal range of motion.     Cervical back: Normal range of motion and neck supple.  Lymphadenopathy:     Cervical: No cervical adenopathy.  Skin:    General: Skin is warm and dry.     Findings: No erythema or rash.     Comments: Mild jaundice  Neurological:     Mental Status: She is alert.     Coordination: Coordination normal.     Comments: Severely fatigued and weak in all 4 extremities, she is able to lift both arms with symmetrical strength, lift both legs with symmetrical strength,  diffusely 4-1/2 out of 5 strength.  Finger-nose-finger is maintained, there is no asterixis.  She has slight slowed speech - globally depressed level of alertness is mild.  Not slurring her speech  Psychiatric:        Behavior: Behavior normal.     ED Results / Procedures / Treatments   Labs (all labs ordered are listed, but only abnormal results are displayed) Labs Reviewed  COMPREHENSIVE METABOLIC PANEL - Abnormal; Notable for the following components:      Result Value   Sodium 130 (*)    Chloride 92  (*)    Glucose, Bld 206 (*)    Creatinine, Ser 1.51 (*)    Calcium 13.4 (*)    Albumin 2.8 (*)    AST 60 (*)    ALT 45 (*)    Alkaline Phosphatase 190 (*)    Total Bilirubin 3.1 (*)    GFR calc non Af Amer 34 (*)    GFR calc Af Amer 40 (*)    All other components within normal limits  CBC - Abnormal; Notable for the following components:   RDW 16.1 (*)    Platelets 133 (*)    All other components within normal limits  URINALYSIS, ROUTINE W REFLEX MICROSCOPIC - Abnormal; Notable for the following components:   APPearance HAZY (*)    Glucose, UA >=500 (*)    Leukocytes,Ua MODERATE (*)    WBC, UA >50 (*)    Bacteria, UA FEW (*)    All other components within normal limits  AMMONIA - Abnormal; Notable for the following components:   Ammonia 41 (*)    All other components within normal limits  MAGNESIUM - Abnormal; Notable for the following components:   Magnesium 2.7 (*)    All other components within normal limits  URINE CULTURE  RESPIRATORY PANEL BY RT PCR (FLU A&B, COVID)  LIPASE, BLOOD    EKG EKG Interpretation  Date/Time:  Sunday May 26 2019 13:44:26 EST Ventricular Rate:  87 PR Interval:  130 QRS Duration: 108 QT Interval:  376 QTC Calculation: 452 R Axis:   -11 Text Interpretation: Normal sinus rhythm Non-specific intra-ventricular conduction delay Normal ECG since last tracing no significant change Confirmed by Noemi Chapel [phone]) on 05/26/2019 2:41:20 PM   Radiology DG Chest Port 1 View  Result Date: 05/26/2019 CLINICAL DATA:  Nausea, vomiting and weakness since last Tuesday. EXAM: PORTABLE CHEST 1 VIEW COMPARISON:  Chest x-ray 05/17/2019 FINDINGS: The cardiac silhouette, mediastinal and hilar contours are within normal limits. Mild tortuosity and calcification of the thoracic aorta. Stable surgical changes from bypass surgery. The lungs are clear of an acute process. No pleural effusions or pulmonary lesions. The bony thorax is intact. IMPRESSION: No acute  cardiopulmonary findings. Electronically Signed   By: Marijo Sanes M.D.   On: 05/26/2019 16:02    Procedures .Critical Care Performed by: Noemi Chapel, MD Authorized by: Noemi Chapel, MD   Critical care provider statement:    Critical care time (minutes):  35   Critical care time was exclusive of:  Separately billable procedures and treating other patients and teaching time   Critical care was necessary to treat or prevent imminent or life-threatening deterioration of the following conditions:  Endocrine crisis and hepatic failure   Critical care was time spent personally by me on the following activities:  Blood draw for specimens, development of treatment plan with patient or surrogate, discussions with consultants, evaluation of patient's response to treatment, examination of patient, obtaining  history from patient or surrogate, ordering and performing treatments and interventions, ordering and review of laboratory studies, ordering and review of radiographic studies, pulse oximetry, re-evaluation of patient's condition and review of old charts   (including critical care time)  Medications Ordered in ED Medications  0.9 %  sodium chloride infusion ( Intravenous New Bag/Given 05/26/19 1553)  cefTRIAXone (ROCEPHIN) 1 g in sodium chloride 0.9 % 100 mL IVPB (has no administration in time range)  sodium chloride flush (NS) 0.9 % injection 3 mL (3 mLs Intravenous Given 05/26/19 1446)    ED Course  I have reviewed the triage vital signs and the nursing notes.  Pertinent labs & imaging results that were available during my care of the patient were reviewed by me and considered in my medical decision making (see chart for details).  Clinical Course as of May 26 1631  Nancy Fetter May 26, 2019  1616 Additional findings on urinalysis show a likely urinary tract infection with greater than 50 white blood cells, bacteria present.  There is no leukocytosis, will page for admission   [BM]    Clinical  Course User Index [BM] Noemi Chapel, MD   MDM Rules/Calculators/A&P                      This patient definitely has hypercalcemia, she appears dehydrated.  There is no history of hyperparathyroidism or malignancy.  Her level is in moderate and almost severe hypercalcemia > 13.  I will start IV fluids, obtain ammonia level, she has a bump in her creatinine to 1.5 with an acute kidney injury and a BUN of 22.  Awaiting a urinalysis.  I do not find anything on the EKG to suggest pathologic influences however this patient will need to stay on cardiac monitoring, she will need to be admitted to the hospital.  He is critically ill with severe hypercalcemia.  Known side effects of Xifaxan include nausea, I do not find any specific link to hypercalcemia.  She does take calcium supplements  D/w Dr. Roosevelt Locks who will admit   I have placed the patient on cardiac monitoring, and have interpreted this cardiac monitoring - there has been normal sinus rhythm and there has been rates between 80 and 100 with occasional ectopic PVC's.  I have personally interpreted the CXR and there is no signs of abnormal findings.  Final Clinical Impression(s) / ED Diagnoses Final diagnoses:  Hypercalcemia  AKI (acute kidney injury) ([place])  Dehydration  Chronic liver failure without hepatic coma ([place])  Hyponatremia      Noemi Chapel, MD 05/26/19 [phone]

## 2019-05-26 NOTE — H&P (Addendum)
History and Physical    ELLANORE VANHOOK Conway:[id number] DOB: 02-04-1946 DOA: 05/26/2019  PCP: Gayland Curry, DO   Patient coming from: Home  I have personally briefly reviewed patient's old medical records in [place]  Chief Complaint: Feeling weak, dehydrated.  HPI: [name] is a 74 y.o. female with medical history significant of HTN, IDDM, CAD sp CABG and non-alcoholic cirrhosis. Patient came with her son, he is the primary historian as she is very weak and fatigued.  He reports that patient has had problems with new medication Xifaxan since her discharge on 01/09. Xifaxan made patient feeling nauseous the time she tried to swallow the pill and ended up vomiting and decreased oral intake, now she is to continue to take her lactulose and diuresis medications, thus in the last few days she became extremely dry and she stopped taking lactulose and became constipated for last 2 days.  The son also reported that patient has had slowed mentation, and slurred speech since yesterday. This altered mental status is severe and progressive fatigue, she is now not able to essentially get out of bed without needing assistance.   ED Course: Blood work showed elevated calcium level, ammonium level remained about the same as the day of her discharge.  Review of Systems: As per HPI otherwise 10 point review of systems negative.    Past Medical History:  Diagnosis Date  . Allergy   . Arthritis    neck  . Cataract    bilateral - MD monitoring cataracts  . CHF (congestive heart failure) ([place])   . Chronic kidney disease, stage I    DR OTTELIN  HX UTIS  . Cirrhosis ([place])   . Cramp of limb   . Diabetes mellitus   . Dysphagia, unspecified(787.20)   . Dysuria   . Epistaxis   . GERD (gastroesophageal reflux disease)   . Heart murmur    NO CARDIOLOGIST  DX FOR YEARS ASYMPTOMATIC  . Lumbago   . Neoplasm of uncertain behavior of skin   . Nonspecific elevation of levels of transaminase  or lactic acid dehydrogenase (LDH)   . Osteoarthrosis, unspecified whether generalized or localized, unspecified site   . Other and unspecified hyperlipidemia    diet controlled  . Pain in joint, shoulder region   . Paresthesias 04/01/2015  . Postablative ovarian failure   . Trochanteric bursitis of left hip 12/15/2015  . Type 2 diabetes mellitus without complication ([place])   . Unspecified essential hypertension    no meds    Past Surgical History:  Procedure Laterality Date  . BREAST BIOPSY    . CARDIAC CATHETERIZATION N/A 01/27/2016   Procedure: Left Heart Cath and Coronary Angiography;  Surgeon: Belva Crome, MD;  Location: [place] [facility];  Service: Cardiovascular;  Laterality: N/A;  . COLONOSCOPY  2012   Dr Lajoyce Corners.   . COLONOSCOPY WITH PROPOFOL N/A 07/07/2016   Procedure: COLONOSCOPY WITH PROPOFOL;  Surgeon: [name], MD;  Location: [facility];  Service: Endoscopy;  Laterality: N/A;  . CORONARY ARTERY BYPASS GRAFT N/A 01/28/2016   Procedure: CORONARY ARTERY BYPASS GRAFTING (CABG) x 3 USING RIGHT LEG GREATER SAPHENOUS VEIN GRAFT;  Surgeon: Melrose Nakayama, MD;  Location: [place];  Service: Open Heart Surgery;  Laterality: N/A;  . ENDOVEIN HARVEST OF GREATER SAPHENOUS VEIN Right 01/28/2016   Procedure: ENDOVEIN HARVEST OF GREATER SAPHENOUS VEIN;  Surgeon: Melrose Nakayama, MD;  Location: [place];  Service: Open Heart Surgery;  Laterality:  Right;  . ESOPHAGEAL BANDING  03/28/2019   Procedure: ESOPHAGEAL BANDING;  Surgeon: [name], MD;  Location: [facility];  Service: Endoscopy;;  . ESOPHAGEAL BANDING  04/07/2019   Procedure: ESOPHAGEAL BANDING;  Surgeon: Juanita Craver, MD;  Location[facility] ENDOSCOPY;  Service: Endoscopy;;  . ESOPHAGOGASTRODUODENOSCOPY N/A 04/07/2019   Procedure: ESOPHAGOGASTRODUODENOSCOPY (EGD);  Surgeon: Juanita Craver, MD;  Location: [facility] ENDOSCOPY;  Service: Endoscopy;  Laterality: N/A;  . ESOPHAGOGASTRODUODENOSCOPY (EGD) WITH PROPOFOL N/A 07/07/2016    Procedure: ESOPHAGOGASTRODUODENOSCOPY (EGD) WITH PROPOFOL;  Surgeon: [name], MD;  Location: [facility];  Service: Endoscopy;  Laterality: N/A;  . ESOPHAGOGASTRODUODENOSCOPY (EGD) WITH PROPOFOL N/A 03/28/2019   Procedure: ESOPHAGOGASTRODUODENOSCOPY (EGD) WITH PROPOFOL;  Surgeon: [name], MD;  Location: [facility];  Service: Endoscopy;  Laterality: N/A;  . HEMOSTASIS CLIP PLACEMENT  04/07/2019   Procedure: HEMOSTASIS CLIP PLACEMENT;  Surgeon: Juanita Craver, MD;  Location: [place] ENDOSCOPY;  Service: Endoscopy;;  . IR ANGIOGRAM SELECTIVE EACH ADDITIONAL VESSEL  04/08/2019  . IR EMBO ART  VEN HEMORR LYMPH EXTRAV  INC GUIDE ROADMAPPING  04/08/2019  . IR PARACENTESIS  04/08/2019  . IR TIPS  04/08/2019  . MAXIMUM ACCESS (MAS)POSTERIOR LUMBAR INTERBODY FUSION (PLIF) 1 LEVEL Left 06/10/2015   Procedure: FOR MAXIMUM ACCESS (MAS) POSTERIOR LUMBAR INTERBODY FUSION (PLIF) LUMBAR THREE-FOUR EXTRAFORAMINAL MICRODISCECTOMY LUMBAR FIVE-SACRAL ONE LEFT;  Surgeon: Eustace Moore, MD;  Location: [place] [facility];  Service: Neurosurgery;  Laterality: Left;  . RADIOLOGY WITH ANESTHESIA N/A 04/08/2019   Procedure: RADIOLOGY WITH ANESTHESIA;  Surgeon: Radiologist, Medication, MD;  Location: [place];  Service: Radiology;  Laterality: N/A;  . SCLEROTHERAPY  04/07/2019   Procedure: SCLEROTHERAPY;  Surgeon: Juanita Craver, MD;  Location: [facility] ENDOSCOPY;  Service: Endoscopy;;  . TEE WITHOUT CARDIOVERSION N/A 01/28/2016   Procedure: TRANSESOPHAGEAL ECHOCARDIOGRAM (TEE);  Surgeon: Melrose Nakayama, MD;  Location: [place];  Service: Open Heart Surgery;  Laterality: N/A;  . TUBAL LIGATION  1982   Dr Connye Burkitt  . UPPER GASTROINTESTINAL ENDOSCOPY    . VAGINAL HYSTERECTOMY  1997   Dr Rande Lawman     reports that she has never smoked. She has never used smokeless tobacco. She reports that she does not drink alcohol or use drugs.  Allergies  Allergen Reactions  . Kiwi Extract Anaphylaxis  . Tdap [Tetanus-Diphth-Acell Pertussis]  Swelling and Other (See Comments)    Swelling at injection site, gets very hot  . Statins Other (See Comments)    RHABDOMYOLYSIS  . Latex Itching, Dermatitis and Rash  . Tramadol Nausea And Vomiting    Family History  Problem Relation Age of Onset  . Lung cancer Father   . Arthritis Sister   . Arthritis Brother   . Heart disease Maternal Grandmother   . Heart disease Maternal Grandfather   . Heart disease Paternal Grandmother   . Heart disease Paternal Grandfather   . Breast cancer Mother   . Liver cancer Brother   . Breast cancer Maternal Aunt   . Breast cancer Paternal Aunt   . Colon cancer Neg Hx   . Esophageal cancer Neg Hx   . Rectal cancer Neg Hx   . Stomach cancer Neg Hx      Prior to Admission medications   Medication Sig Start Date End Date Taking? Authorizing Provider  acetaminophen (TYLENOL) 500 MG tablet Take 500 mg by mouth at bedtime.    Yes [provider]  Aromatic Inhalants (VICKS VAPOR IN) Vicks Vapor Rub apply small amount to outside of  nose to help breathing   Yes [provider]  BD PEN NEEDLE NANO U/F 32G X 4 MM MISC USE THREE TIMES DAILY AS DIRECTED 11/26/18  Yes Reed, Tiffany L, DO  Biotin 10000 MCG TABS Take 10,000 mcg by mouth every morning.   Yes [provider]  bisacodyl (DULCOLAX) 10 MG suppository Place 1 suppository (10 mg total) rectally daily as needed for moderate constipation. 04/12/19  Yes Aline August, MD  calcium carbonate (OS-CAL) 600 MG TABS Take 600 mg by mouth daily.    Yes [provider]  Cholecalciferol (VITAMIN D) 50 MCG (2000 UT) CAPS Take 2,000 Units by mouth daily.    Yes [provider]  Cyanocobalamin (VITAMIN B 12 PO) Take 1,000 mcg by mouth daily.     Yes [provider]  ezetimibe (ZETIA) 10 MG tablet TAKE 1 TABLET(10 MG) BY MOUTH DAILY 05/24/19  Yes Reed, Tiffany L, DO  furosemide (LASIX) 40 MG tablet Take 40 mg by mouth daily.    Yes [provider]  glucose  blood test strip One Touch Ultra II strips. Use to test blood sugar three times daily. Dx: E11.65 06/01/17  Yes Reed, Tiffany L, DO  insulin detemir (LEVEMIR) 100 UNIT/ML injection Inject 0.5 mLs (50 Units total) into the skin at bedtime. 05/24/19  Yes Reed, Tiffany L, DO  Insulin Syringe-Needle U-100 (INSULIN SYRINGE 1CC/31GX5/16") 31G X 5/16" 1 ML MISC USE AS DIRECTED DAILY WITH LEVEMIR 07/27/18  Yes Reed, Tiffany L, DO  JARDIANCE 25 MG TABS tablet Take 25 mg by mouth daily. 04/30/19  Yes [provider]  lactulose (CHRONULAC) 10 GM/15ML solution Take 45 mLs (30 g total) by mouth 4 (four) times daily. 05/18/19  Yes Oretha Milch D, MD  loratadine (CLARITIN) 10 MG tablet Take 10 mg by mouth daily as needed for allergies.   Yes [provider]  MAGNESIUM PO Take 500 mg by mouth daily.    Yes [provider]  Multiple Vitamins-Minerals (MULTIVITAMIN WITH MINERALS) tablet Take 1 tablet by mouth daily.     Yes [provider]  NOVOLOG FLEXPEN 100 UNIT/ML FlexPen Inject 12 units under the skin every morning, 8 units at lunch and 12 units at supper 02/19/19  Yes Reed, Tiffany L, DO  ondansetron (ZOFRAN) 4 MG tablet Take 1 tablet (4 mg total) by mouth every 6 (six) hours as needed for nausea. 05/18/19  Yes Oretha Milch D, MD  pantoprazole (PROTONIX) 40 MG tablet Take 1 tablet (40 mg total) by mouth daily. 05/24/19 06/23/19 Yes Reed, Tiffany L, DO  Polyethyl Glycol-Propyl Glycol (SYSTANE OP) Place 1 drop into both eyes daily.    Yes [provider]  Probiotic Product (PROBIOTIC DAILY PO) Take 1 capsule by mouth daily. Digestive Advantage Probiotic   Yes [provider]  rifaximin (XIFAXAN) 550 MG TABS tablet Take 1 tablet (550 mg total) by mouth 2 (two) times daily. 05/18/19  Yes Oretha Milch D, MD  spironolactone (ALDACTONE) 50 MG tablet Take 1 tablet (50 mg total) by mouth daily. 05/24/19  Yes Gayland Curry, DO    Physical Exam: Vitals:   05/26/19 1330  05/26/19 1449 05/26/19 1600 05/26/19 1630  BP: (!) 113/96 (!) 118/56 (!) 104/49 (!) 108/49  Pulse: 89 81 80 80  Resp: 16 18 17 18   Temp: 98.3 F (36.8 C) 98 F (36.7 C)    TempSrc: Oral Oral    SpO2: 99% 96% 98% 98%    Constitutional: NAD, calm, comfortable Vitals:  05/26/19 1330 05/26/19 1449 05/26/19 1600 05/26/19 1630  BP: (!) 113/96 (!) 118/56 (!) 104/49 (!) 108/49  Pulse: 89 81 80 80  Resp: 16 18 17 18   Temp: 98.3 F (36.8 C) 98 F (36.7 C)    TempSrc: Oral Oral    SpO2: 99% 96% 98% 98%   Eyes: PERRL, lids and conjunctivae normal ENMT: Mucous membranes are dry. Posterior pharynx clear of any exudate or lesions.Normal dentition.  Neck: normal, supple, no masses, no thyromegaly Respiratory: clear to auscultation bilaterally, no wheezing, no crackles. Normal respiratory effort. No accessory muscle use.  Cardiovascular: Regular rate and rhythm, no murmurs / rubs / gallops. No extremity edema. 2+ pedal pulses. No carotid bruits.  Abdomen: no tenderness, no masses palpated. No hepatosplenomegaly. Bowel sounds positive.  Musculoskeletal: no clubbing / cyanosis. No joint deformity upper and lower extremities. Good ROM, no contractures. Normal muscle tone.  Skin: no rashes, lesions, ulcers. No induration Neurologic: CN 2-12 grossly intact. Sensation intact, DTR normal. Strength 5/5 in all 4.  Psychiatric: Normal judgment and insight. Alert and oriented x 3. Normal mood.     Labs on Admission: I have personally reviewed following labs and imaging studies  CBC: Recent Labs  Lab 05/26/19 1332  WBC 8.8  HGB 15.0  HCT 45.3  MCV 98.9  PLT 741*   Basic Metabolic Panel: Recent Labs  Lab 05/26/19 1332 05/26/19 1520  NA 130*  --   K 4.2  --   CL 92*  --   CO2 27  --   GLUCOSE 206*  --   BUN 22  --   CREATININE 1.51*  --   CALCIUM 13.4*  --   MG  --  2.7*   GFR: Estimated Creatinine Clearance: 30.7 mL/min (A) (by C-G formula based on SCr of 1.51 mg/dL (H)). Liver  Function Tests: Recent Labs  Lab 05/26/19 1332  AST 60*  ALT 45*  ALKPHOS 190*  BILITOT 3.1*  PROT 7.4  ALBUMIN 2.8*   Recent Labs  Lab 05/26/19 1332  LIPASE 25   Recent Labs  Lab 05/26/19 1521  AMMONIA 41*   Coagulation Profile: No results for input(s): INR, PROTIME in the last 168 hours. Cardiac Enzymes: No results for input(s): CKTOTAL, CKMB, CKMBINDEX, TROPONINI in the last 168 hours. BNP (last 3 results) No results for input(s): PROBNP in the last 8760 hours. HbA1C: No results for input(s): HGBA1C in the last 72 hours. CBG: No results for input(s): GLUCAP in the last 168 hours. Lipid Profile: No results for input(s): CHOL, HDL, LDLCALC, TRIG, CHOLHDL, LDLDIRECT in the last 72 hours. Thyroid Function Tests: No results for input(s): TSH, T4TOTAL, FREET4, T3FREE, THYROIDAB in the last 72 hours. Anemia Panel: No results for input(s): VITAMINB12, FOLATE, FERRITIN, TIBC, IRON, RETICCTPCT in the last 72 hours. Urine analysis:    Component Value Date/Time   COLORURINE YELLOW 05/26/2019 1525   APPEARANCEUR HAZY (A) 05/26/2019 1525   LABSPEC 1.018 05/26/2019 1525   PHURINE 6.0 05/26/2019 1525   GLUCOSEU >=500 (A) 05/26/2019 1525   HGBUR NEGATIVE 05/26/2019 1525   BILIRUBINUR NEGATIVE 05/26/2019 1525   BILIRUBINUR neg 05/05/2012 1445   KETONESUR NEGATIVE 05/26/2019 1525   PROTEINUR NEGATIVE 05/26/2019 1525   UROBILINOGEN 0.2 05/05/2012 1445   UROBILINOGEN 0.2 12/18/2011 1409   NITRITE NEGATIVE 05/26/2019 1525   LEUKOCYTESUR MODERATE (A) 05/26/2019 1525    Radiological Exams on Admission: DG Chest Port 1 View  Result Date: 05/26/2019 CLINICAL DATA:  Nausea, vomiting and weakness since last  Tuesday. EXAM: PORTABLE CHEST 1 VIEW COMPARISON:  Chest x-ray 05/17/2019 FINDINGS: The cardiac silhouette, mediastinal and hilar contours are within normal limits. Mild tortuosity and calcification of the thoracic aorta. Stable surgical changes from bypass surgery. The lungs are  clear of an acute process. No pleural effusions or pulmonary lesions. The bony thorax is intact. IMPRESSION: No acute cardiopulmonary findings. Electronically Signed   By: Marijo Sanes M.D.   On: 05/26/2019 16:02    EKG: Independently reviewed.   Assessment/Plan Active Problems:   Hypercalcemia  Hypercalcemia, likely from severe dehydration, plus iatrogenic calcium intake and vitamin D overload (confirmed with patient son that, patient still taking all her vitamins pills yesterday including calcium pills and vitamin D).  Increase IV fluid rate to 125 for tonight, start diuresis tomorrow to help calcium excretion.  PTH and PTH related peptide also sent.  Hold vitamins and calcium supplement for now.  Acute metabolic encephalopathy, secondary to hypercalcemia as above.  Followed her mental status tomorrow.  Cirrhosis status post TIPS, and son was concerned about patient ammonium level, and request talk to hepatologist.  I discussed with on-call GI service Dr. Fuller Plan, agreed to look into the Xifaxan tolerance issue.  Ammonia level remained stable, less concern about hepatic encephalopathy as of now.  Exam showed minimum ascites, monitor closely while doing hydration phase and consider albumin instead of crystalloid.  Severe protein calorie malnutrition, patient's son looks like very knowledgeable about appropriate protein categories in lieu of cirrhosis.  Hyponatremia, with hypochloridemia, hypovolemic from severe dehydration, normal saline hydration for today to correct volume.  AKI, dehydration, hold Lasix for today.  Constipation, from poor oral intake, trial of diet today, lactulose as needed to titrate bowel movement 2-3 times.  IDDM, sliding scale.    DVT prophylaxis: SCD Code Status: Full code Family Communication: Son at bedside Disposition Plan: SNF? Consults called: GI attending Dr. Fuller Plan Admission status: Telemetry admission   Lequita Halt MD [facility] Pager  [phone]  If 7PM-7AM, please contact night-coverage [email or web address] Password [facility]  05/26/2019, 7:20 PM

## 2019-05-27 ENCOUNTER — Inpatient Hospital Stay (HOSPITAL_COMMUNITY): Payer: Medicare Other

## 2019-05-27 ENCOUNTER — Encounter (HOSPITAL_COMMUNITY): Payer: Self-pay | Admitting: Internal Medicine

## 2019-05-27 DIAGNOSIS — N179 Acute kidney failure, unspecified: Principal | ICD-10-CM

## 2019-05-27 DIAGNOSIS — K7290 Hepatic failure, unspecified without coma: Secondary | ICD-10-CM

## 2019-05-27 DIAGNOSIS — K7460 Unspecified cirrhosis of liver: Secondary | ICD-10-CM

## 2019-05-27 DIAGNOSIS — Z95828 Presence of other vascular implants and grafts: Secondary | ICD-10-CM

## 2019-05-27 LAB — GLUCOSE, CAPILLARY
Glucose-Capillary: 174 mg/dL — ABNORMAL HIGH (ref 70–99)
Glucose-Capillary: 194 mg/dL — ABNORMAL HIGH (ref 70–99)

## 2019-05-27 LAB — COMPREHENSIVE METABOLIC PANEL
ALT: 36 U/L (ref 0–44)
AST: 53 U/L — ABNORMAL HIGH (ref 15–41)
Albumin: 2.2 g/dL — ABNORMAL LOW (ref 3.5–5.0)
Alkaline Phosphatase: 148 U/L — ABNORMAL HIGH (ref 38–126)
Anion gap: 9 (ref 5–15)
BUN: 22 mg/dL (ref 8–23)
CO2: 23 mmol/L (ref 22–32)
Calcium: 11.3 mg/dL — ABNORMAL HIGH (ref 8.9–10.3)
Chloride: 103 mmol/L (ref 98–111)
Creatinine, Ser: 1.44 mg/dL — ABNORMAL HIGH (ref 0.44–1.00)
GFR calc Af Amer: 42 mL/min — ABNORMAL LOW (ref >60)
GFR calc non Af Amer: 36 mL/min — ABNORMAL LOW (ref >60)
Glucose, Bld: 184 mg/dL — ABNORMAL HIGH (ref 70–99)
Potassium: 4.2 mmol/L (ref 3.5–5.1)
Sodium: 135 mmol/L (ref 135–145)
Total Bilirubin: 2.2 mg/dL — ABNORMAL HIGH (ref 0.3–1.2)
Total Protein: 5.8 g/dL — ABNORMAL LOW (ref 6.5–8.1)

## 2019-05-27 LAB — PTH, INTACT AND CALCIUM
Calcium, Total (PTH): 13.4 mg/dL (ref 8.7–10.3)
PTH: 9 pg/mL — ABNORMAL LOW (ref 15–65)

## 2019-05-27 LAB — CBC
HCT: 41.4 % (ref 36.0–46.0)
Hemoglobin: 13.5 g/dL (ref 12.0–15.0)
MCH: 32.5 pg (ref 26.0–34.0)
MCHC: 32.6 g/dL (ref 30.0–36.0)
MCV: 99.8 fL (ref 80.0–100.0)
Platelets: 98 10*3/uL — ABNORMAL LOW (ref 150–400)
RBC: 4.15 MIL/uL (ref 3.87–5.11)
RDW: 16.1 % — ABNORMAL HIGH (ref 11.5–15.5)
WBC: 5.9 10*3/uL (ref 4.0–10.5)
nRBC: 0 % (ref 0.0–0.2)

## 2019-05-27 LAB — CALCIUM / CREATININE RATIO, URINE
Calcium, Ur: 43.5 mg/dL
Calcium/Creat.Ratio: 867 mg/g creat — ABNORMAL HIGH (ref 29–442)
Creatinine, Urine: 50.2 mg/dL

## 2019-05-27 LAB — CBG MONITORING, ED
Glucose-Capillary: 161 mg/dL — ABNORMAL HIGH (ref 70–99)
Glucose-Capillary: 265 mg/dL — ABNORMAL HIGH (ref 70–99)

## 2019-05-27 MED ORDER — SODIUM CHLORIDE 0.9 % IV SOLN: INTRAVENOUS | Status: DC

## 2019-05-27 MED ORDER — WHITE PETROLATUM EX OINT
TOPICAL_OINTMENT | CUTANEOUS | Status: AC
  Filled 2019-05-27: qty 28.35

## 2019-05-27 NOTE — Progress Notes (Signed)
PROGRESS NOTE    [name]  TWS:[id number] DOB: 15-Dec-1945 DOA: 05/26/2019 PCP: Gayland Curry, DO  Outpatient Specialists:   Brief Narrative:  Patient is a 74 year old Caucasian female with past medical history significant for hypertension, diabetes mellitus, coronary artery disease status post CABG and nonalcoholic cirrhosis.  Patient was admitted with hypercalcemia, volume depletion, weakness and fatigue.  Apparently, patient has not been able to tolerate Xifaxan.  On presentation, corrected calcium level was 14.  Current corrected calcium level is 12.74.  Patient was on calcium carbonate and vitamin D prior to presentation.  Continue to hold calcium and vitamin D.  Decrease IV fluid normal saline at 150 cc/h.  Continue to monitor calcium level.  For further management of the calcium level if calcium remains elevated.  Patient seems to be slowly improving.  Overall, patient's prognosis remains guarded.  Meanwhile, we discontinue Lasix and Aldactone for now.   Assessment & Plan:   Active Problems:   Hypercalcemia  Hypercalcemia: -Likely multifactorial. -Patient is volume depleted,  and patient was on calcium supplements and vitamin D.   -Continue to hold vitamin D and calcium supplement  -Continue aggressive hydration  -Further management of the hypercalcemia if calcium remains elevated.   -Follow PTH and PTH related peptide.  Acute metabolic encephalopathy: -Seems to have resolved significantly. -Likely secondary to above.  Cirrhosis status post TIPS: -Ammonia level on presentation was 41.   -Patient has continued to improve.   -Ultrasound of the abdomen was negative for ascites.   -Further management will depend on hospital course.    Severe protein calorie malnutrition: -Caloric count -Poor p.o. intake reported -Low threshold to start patient on Megace.  Hyponatremia: -Resolved with volume resuscitation  -Likely prerenal   AKI: -Slowly improving -May be  prerenal -Hypercalcemia may be contributory -Further work-up if no improvement -Continue IV fluid  Diabetes mellitus: -Continue to monitor  DVT prophylaxis: SCD Code Status: Full code Family Communication: Husband Disposition Plan: This will depend on hospital course   Consultants:   None  Procedures:   None  Antimicrobials:   IV ceftriaxone (not clear the indication for ceftriaxone)   Subjective: No new complaints Weakness is improving Mental status is improved significantly  Objective: Vitals:   05/27/19 1000 05/27/19 1100 05/27/19 1114 05/27/19 1200  BP: (!) 100/45 (!) 106/51 (!) 94/41 (!) 95/46  Pulse: 82 86 84 78  Resp: 17 16 20 16   Temp:      TempSrc:      SpO2: 97% 98% 96% 96%   No intake or output data in the 24 hours ending 05/27/19 1339 There were no vitals filed for this visit.  Examination:  General exam: Appears calm and comfortable.  Patient is pale and jaundiced. Respiratory system: Clear to auscultation. Respiratory effort normal. Cardiovascular system: S1 & S2 heard, RRR.  Gastrointestinal system: Abdomen is nondistended, soft and nontender.  Central nervous system: Awake and alert.  Patient moves all extremities.   Extremities: No leg edema  Data Reviewed: I have personally reviewed following labs and imaging studies  CBC: Recent Labs  Lab 05/26/19 1332 05/27/19 0352  WBC 8.8 5.9  HGB 15.0 13.5  HCT 45.3 41.4  MCV 98.9 99.8  PLT 133* 98*   Basic Metabolic Panel: Recent Labs  Lab 05/26/19 1332 05/26/19 1520 05/27/19 0352  NA 130*  --  135  K 4.2  --  4.2  CL 92*  --  103  CO2 27  --  23  GLUCOSE 206*  --  184*  BUN 22  --  22  CREATININE 1.51*  --  1.44*  CALCIUM 13.4*  --  11.3*  MG  --  2.7*  --    GFR: Estimated Creatinine Clearance: 32.2 mL/min (A) (by C-G formula based on SCr of 1.44 mg/dL (H)). Liver Function Tests: Recent Labs  Lab 05/26/19 1332 05/27/19 0352  AST 60* 53*  ALT 45* 36  ALKPHOS 190* 148*   BILITOT 3.1* 2.2*  PROT 7.4 5.8*  ALBUMIN 2.8* 2.2*   Recent Labs  Lab 05/26/19 1332  LIPASE 25   Recent Labs  Lab 05/26/19 1521  AMMONIA 41*   Coagulation Profile: No results for input(s): INR, PROTIME in the last 168 hours. Cardiac Enzymes: No results for input(s): CKTOTAL, CKMB, CKMBINDEX, TROPONINI in the last 168 hours. BNP (last 3 results) No results for input(s): PROBNP in the last 8760 hours. HbA1C: No results for input(s): HGBA1C in the last 72 hours. CBG: Recent Labs  Lab 05/26/19 2245 05/26/19 2354 05/27/19 0751 05/27/19 1235  GLUCAP 199* 185* 161* 265*   Lipid Profile: No results for input(s): CHOL, HDL, LDLCALC, TRIG, CHOLHDL, LDLDIRECT in the last 72 hours. Thyroid Function Tests: No results for input(s): TSH, T4TOTAL, FREET4, T3FREE, THYROIDAB in the last 72 hours. Anemia Panel: No results for input(s): VITAMINB12, FOLATE, FERRITIN, TIBC, IRON, RETICCTPCT in the last 72 hours. Urine analysis:    Component Value Date/Time   COLORURINE YELLOW 05/26/2019 1525   APPEARANCEUR HAZY (A) 05/26/2019 1525   LABSPEC 1.018 05/26/2019 1525   PHURINE 6.0 05/26/2019 1525   GLUCOSEU >=500 (A) 05/26/2019 1525   HGBUR NEGATIVE 05/26/2019 1525   BILIRUBINUR NEGATIVE 05/26/2019 1525   BILIRUBINUR neg 05/05/2012 1445   KETONESUR NEGATIVE 05/26/2019 1525   PROTEINUR NEGATIVE 05/26/2019 1525   UROBILINOGEN 0.2 05/05/2012 1445   UROBILINOGEN 0.2 12/18/2011 1409   NITRITE NEGATIVE 05/26/2019 1525   LEUKOCYTESUR MODERATE (A) 05/26/2019 1525   Sepsis Labs: @LABRCNTIP (procalcitonin:4,lacticidven:4)  ) Recent Results (from the past 240 hour(s))  Urine culture     Status: Abnormal (Preliminary result)   Collection Time: 05/26/19  3:07 PM   Specimen: Urine, Clean Catch  Result Value Ref Range Status   Specimen Description URINE, CLEAN CATCH  Final   Special Requests NONE  Final   Culture (A)  Final    >=100,000 COLONIES/mL GRAM NEGATIVE RODS IDENTIFICATION AND  SUSCEPTIBILITIES TO FOLLOW Performed at [facility], [place] [address], [place], [place] [address]    Report Status PENDING  Incomplete  Respiratory Panel by RT PCR (Flu A&B, Covid) - Nasopharyngeal Swab     Status: None   Collection Time: 05/26/19  4:30 PM   Specimen: Nasopharyngeal Swab  Result Value Ref Range Status   SARS Coronavirus 2 by RT PCR NEGATIVE NEGATIVE Final    Comment: (NOTE) SARS-CoV-2 target nucleic acids are NOT DETECTED. The SARS-CoV-2 RNA is generally detectable in upper respiratoy specimens during the acute phase of infection. The lowest concentration of SARS-CoV-2 viral copies this assay can detect is 131 copies/mL. A negative result does not preclude SARS-Cov-2 infection and should not be used as the sole basis for treatment or other patient management decisions. A negative result may occur with  improper specimen collection/handling, submission of specimen other than nasopharyngeal swab, presence of viral mutation(s) within the areas targeted by this assay, and inadequate number of viral copies (<131 copies/mL). A negative result must be combined with clinical observations, patient history, and epidemiological information. The expected result is  Negative. Fact Sheet for Patients:  PinkCheek.be Fact Sheet for Healthcare Providers:  GravelBags.it This test is not yet ap proved or cleared by the [place] FDA and  has been authorized for detection and/or diagnosis of SARS-CoV-2 by FDA under an Emergency Use Authorization (EUA). This EUA will remain  in effect (meaning this test can be used) for the duration of the COVID-19 declaration under Section 564(b)(1) of the Act, 21 U.S.C. section 360bbb-3(b)(1), unless the authorization is terminated or revoked sooner.    Influenza A by PCR NEGATIVE NEGATIVE Final   Influenza B by PCR NEGATIVE NEGATIVE Final    Comment: (NOTE) The Xpert Xpress  SARS-CoV-2/FLU/RSV assay is intended as an aid in  the diagnosis of influenza from Nasopharyngeal swab specimens and  should not be used as a sole basis for treatment. Nasal washings and  aspirates are unacceptable for Xpert Xpress SARS-CoV-2/FLU/RSV  testing. Fact Sheet for Patients: PinkCheek.be Fact Sheet for Healthcare Providers: GravelBags.it This test is not yet approved or cleared by the [place] FDA and  has been authorized for detection and/or diagnosis of SARS-CoV-2 by  FDA under an Emergency Use Authorization (EUA). This EUA will remain  in effect (meaning this test can be used) for the duration of the  Covid-19 declaration under Section 564(b)(1) of the Act, 21  U.S.C. section 360bbb-3(b)(1), unless the authorization is  terminated or revoked. Performed at [facility], [place] [address], [place], [place] [address]          Radiology Studies: DG Chest Port 1 View  Result Date: 05/26/2019 CLINICAL DATA:  Nausea, vomiting and weakness since last Tuesday. EXAM: PORTABLE CHEST 1 VIEW COMPARISON:  Chest x-ray 05/17/2019 FINDINGS: The cardiac silhouette, mediastinal and hilar contours are within normal limits. Mild tortuosity and calcification of the thoracic aorta. Stable surgical changes from bypass surgery. The lungs are clear of an acute process. No pleural effusions or pulmonary lesions. The bony thorax is intact. IMPRESSION: No acute cardiopulmonary findings. Electronically Signed   By: Marijo Sanes M.D.   On: 05/26/2019 16:02        Scheduled Meds: . acetaminophen  500 mg Oral QHS  . acidophilus  1 capsule Oral Daily  . ezetimibe  10 mg Oral Daily  . furosemide  40 mg Oral Daily  . insulin aspart  0-9 Units Subcutaneous TID WC  . lactulose  30 g Oral QID  . pantoprazole  40 mg Oral Daily  . polyvinyl alcohol   Both Eyes Daily  . rifaximin  550 mg Oral BID  . spironolactone  50 mg Oral Daily    Continuous Infusions: . sodium chloride 250 mL/hr at 05/26/19 1553  . sodium chloride 125 mL/hr at 05/27/19 0556     LOS: 1 day    Time spent: 64 Minutes    Dana Allan, MD  [facility] Pager #: [phone] 7PM-7AM contact night coverage as above

## 2019-05-27 NOTE — Consult Note (Signed)
Consultation  Referring Provider:      Primary Care Physician:  Gayland Curry, DO Primary Gastroenterologist:   [name]      Reason for Consultation:     Encephalopathy, cirrhosis     Impression / Plan:   IMPRESSION  Decompensated cirrhosis - varices, s/p TIPS, Hepatic Encephalopathy  Hypercalcemia - new and likely cause of nausea issues as opposed to lactulose and Xifaxan  PLAN  Evaluate and treat hypercalcemia  US abdomen ? Ascites - need to consider tapping if enough present  Consider IR eval about the size of TIPS - son asked if it needed to be partially occluded - I doubt - I think recent problems with encephalopathy mutifactorial but will keep this in mind  Son asked about hepatologist seeing her and I explained that we do not have a subspecialty hepatologist available here but that could be done if desired/needed as an outpatient  Gatha Mayer, MD, [facility] [phone] 05/27/2019 1:43 PM    HPI:   [name] is a 74 y.o. female admitted overnight with hypercalcemia in the setting of decompensated cirrhosis with previous TIPS and hepatic encephalopathy problems.  We are consulted because she was having difficulty ingesting her Xifaxan she was nauseous and it was not going well and in speaking with her son it was thought that she was having recurrent encephalopathy.  I talked to him at length about her problems, she had very large esophageal varices that were banded by Dr. [name] and then she had significant bleeding afterwards due to ulcerations and it was elected for her to have a TIPS procedure.  She had had ascites and was diuresing well when she was seen by Dr. [name] in December and he reduced her diuretic dosing.  Then in early January she had hepatic encephalopathy thought triggered by UTI and had lactulose and Xifaxan added to her regimen.  She had to get Xifaxan samples.  She then is back now with complaints of weakness and  dehydration, and had slowed mentation and slurred speech.  She was very fatigued and had difficulty getting out of bed and was nauseous and was not taking her medications well.  She did not take her lactulose and diuretics and in part because she was vomiting.  It was their perception that the Xifaxan was causing the symptoms.  Ammonia level is similar to level at discharge on 113.  She has been hydrated here and her mental status is improved, and she is being treated with ceftriaxone.  Calcium level was 11.3 with a total protein of 5.8 and an albumin of 2.2. This corrects to 12.7.  PTH intact and calcium ordered.  PTH related peptide ordered.  Last EGD 04/07/2019 by Dr. Collene Mares and Dr. Watt Climes the patient had multiple ulcerations in her esophagus and some bleeding varices.  Subsequent to that she had her TIPS. Colonoscopy 2018 with a 4 mm adenoma, Dr. [name]  Past Medical History:  Diagnosis Date  . Allergy   . Arthritis    neck  . Cataract    bilateral - MD monitoring cataracts  . CHF (congestive heart failure) ([place])   . Chronic kidney disease, stage I    DR OTTELIN  HX UTIS  . Cirrhosis ([place])   . Cramp of limb   . Diabetes mellitus   . Dysphagia, unspecified(787.20)   . Dysuria   . Epistaxis   . GERD (gastroesophageal reflux disease)   . Heart murmur  NO CARDIOLOGIST  DX FOR YEARS ASYMPTOMATIC  . Lumbago   . Neoplasm of uncertain behavior of skin   . Nonspecific elevation of levels of transaminase or lactic acid dehydrogenase (LDH)   . Osteoarthrosis, unspecified whether generalized or localized, unspecified site   . Other and unspecified hyperlipidemia    diet controlled  . Pain in joint, shoulder region   . Paresthesias 04/01/2015  . Postablative ovarian failure   . Trochanteric bursitis of left hip 12/15/2015  . Type 2 diabetes mellitus without complication ([place])   . Unspecified essential hypertension    no meds    Past Surgical History:  Procedure Laterality Date  .  BREAST BIOPSY    . CARDIAC CATHETERIZATION N/A 01/27/2016   Procedure: Left Heart Cath and Coronary Angiography;  Surgeon: Belva Crome, MD;  Location: [place] [facility];  Service: Cardiovascular;  Laterality: N/A;  . COLONOSCOPY  2012   Dr Lajoyce Corners.   . COLONOSCOPY WITH PROPOFOL N/A 07/07/2016   Procedure: COLONOSCOPY WITH PROPOFOL;  Surgeon: [name], MD;  Location: [facility];  Service: Endoscopy;  Laterality: N/A;  . CORONARY ARTERY BYPASS GRAFT N/A 01/28/2016   Procedure: CORONARY ARTERY BYPASS GRAFTING (CABG) x 3 USING RIGHT LEG GREATER SAPHENOUS VEIN GRAFT;  Surgeon: Melrose Nakayama, MD;  Location: [place];  Service: Open Heart Surgery;  Laterality: N/A;  . ENDOVEIN HARVEST OF GREATER SAPHENOUS VEIN Right 01/28/2016   Procedure: ENDOVEIN HARVEST OF GREATER SAPHENOUS VEIN;  Surgeon: Melrose Nakayama, MD;  Location: [place];  Service: Open Heart Surgery;  Laterality: Right;  . ESOPHAGEAL BANDING  03/28/2019   Procedure: ESOPHAGEAL BANDING;  Surgeon: [name], MD;  Location: [facility];  Service: Endoscopy;;  . ESOPHAGEAL BANDING  04/07/2019   Procedure: ESOPHAGEAL BANDING;  Surgeon: Juanita Craver, MD;  Location: [facility] ENDOSCOPY;  Service: Endoscopy;;  . ESOPHAGOGASTRODUODENOSCOPY N/A 04/07/2019   Procedure: ESOPHAGOGASTRODUODENOSCOPY (EGD);  Surgeon: Juanita Craver, MD;  [facility] END[facility]COPY;  Service: Endoscopy;  Laterality: N/A;  . ESOPHAGOGASTRODUODENOSCOPY (EGD) WITH PROPOFOL N/A 07/07/2016   Procedure: ESOPHAGOGASTRODUODENOSCOPY (EGD) WITH PROPOFOL;  Surgeon: [name], MD;  Location: [facility];  Service: Endoscopy;  Laterality: N/A;  . ESOPHAGOGASTRODUODENOSCOPY (EGD) WITH PROPOFOL N/A 03/28/2019   Procedure: ESOPHAGOGASTRODUODENOSCOPY (EGD) WITH PROPOFOL;  Surgeon: [name], MD;  Location: [facility];  Service: Endoscopy;  Laterality: N/A;  . HEMOSTASIS CLIP PLACEMENT  04/07/2019   Procedure: HEMOSTASIS CLIP PLACEMENT;  Surgeon: Juanita Craver, MD;   Location: [place] ENDOSCOPY;  Service: Endoscopy;;  . IR ANGIOGRAM SELECTIVE EACH ADDITIONAL VESSEL  04/08/2019  . IR EMBO ART  VEN HEMORR LYMPH EXTRAV  INC GUIDE ROADMAPPING  04/08/2019  . IR PARACENTESIS  04/08/2019  . IR TIPS  04/08/2019  . MAXIMUM ACCESS (MAS)POSTERIOR LUMBAR INTERBODY FUSION (PLIF) 1 LEVEL Left 06/10/2015   Procedure: FOR MAXIMUM ACCESS (MAS) POSTERIOR LUMBAR INTERBODY FUSION (PLIF) LUMBAR THREE-FOUR EXTRAFORAMINAL MICRODISCECTOMY LUMBAR FIVE-SACRAL ONE LEFT;  Surgeon: Eustace Moore, MD;  Location: [place] [facility];  Service: Neurosurgery;  Laterality: Left;  . RADIOLOGY WITH ANESTHESIA N/A 04/08/2019   Procedure: RADIOLOGY WITH ANESTHESIA;  Surgeon: Radiologist, Medication, MD;  Location: [place];  Service: Radiology;  Laterality: N/A;  . SCLEROTHERAPY  04/07/2019   Procedure: SCLEROTHERAPY;  Surgeon: Juanita Craver, MD;  Location: [facility] ENDOSCOPY;  Service: Endoscopy;;  . TEE WITHOUT CARDIOVERSION N/A 01/28/2016   Procedure: TRANSESOPHAGEAL ECHOCARDIOGRAM (TEE);  Surgeon: Melrose Nakayama, MD;  Location: [place];  Service: Open Heart Surgery;  Laterality: N/A;  .  TUBAL LIGATION  1982   Dr Connye Burkitt  . UPPER GASTROINTESTINAL ENDOSCOPY    . VAGINAL HYSTERECTOMY  1997   Dr Rande Lawman    Family History  Problem Relation Age of Onset  . Lung cancer Father   . Arthritis Sister   . Arthritis Brother   . Heart disease Maternal Grandmother   . Heart disease Maternal Grandfather   . Heart disease Paternal Grandmother   . Heart disease Paternal Grandfather   . Breast cancer Mother   . Liver cancer Brother   . Breast cancer Maternal Aunt   . Breast cancer Paternal Aunt   . Colon cancer Neg Hx   . Esophageal cancer Neg Hx   . Rectal cancer Neg Hx   . Stomach cancer Neg Hx     Social History   Tobacco Use  . Smoking status: Never Smoker  . Smokeless tobacco: Never Used  Substance Use Topics  . Alcohol use: No  . Drug use: No   Social History   Social History Narrative   She is  retired she is married and lives with her husband   6 children   No alcohol or tobacco never smoker no drug use     Prior to Admission medications   Medication Sig Start Date End Date Taking? Authorizing Provider  acetaminophen (TYLENOL) 500 MG tablet Take 500 mg by mouth at bedtime.    Yes [provider]  Aromatic Inhalants (VICKS VAPOR IN) Vicks Vapor Rub apply small amount to outside of nose to help breathing   Yes [provider]  BD PEN NEEDLE NANO U/F 32G X 4 MM MISC USE THREE TIMES DAILY AS DIRECTED 11/26/18  Yes Reed, Tiffany L, DO  Biotin 10000 MCG TABS Take 10,000 mcg by mouth every morning.   Yes [provider]  bisacodyl (DULCOLAX) 10 MG suppository Place 1 suppository (10 mg total) rectally daily as needed for moderate constipation. 04/12/19  Yes Aline August, MD  calcium carbonate (OS-CAL) 600 MG TABS Take 600 mg by mouth daily.    Yes [provider]  Cholecalciferol (VITAMIN D) 50 MCG (2000 UT) CAPS Take 2,000 Units by mouth daily.    Yes [provider]  Cyanocobalamin (VITAMIN B 12 PO) Take 1,000 mcg by mouth daily.     Yes [provider]  ezetimibe (ZETIA) 10 MG tablet TAKE 1 TABLET(10 MG) BY MOUTH DAILY 05/24/19  Yes Reed, Tiffany L, DO  furosemide (LASIX) 40 MG tablet Take 40 mg by mouth daily.    Yes [provider]  glucose blood test strip One Touch Ultra II strips. Use to test blood sugar three times daily. Dx: E11.65 06/01/17  Yes Reed, Tiffany L, DO  insulin detemir (LEVEMIR) 100 UNIT/ML injection Inject 0.5 mLs (50 Units total) into the skin at bedtime. 05/24/19  Yes Reed, Tiffany L, DO  Insulin Syringe-Needle U-100 (INSULIN SYRINGE 1CC/31GX5/16") 31G X 5/16" 1 ML MISC USE AS DIRECTED DAILY WITH LEVEMIR 07/27/18  Yes Reed, Tiffany L, DO  JARDIANCE 25 MG TABS tablet Take 25 mg by mouth daily. 04/30/19  Yes [provider]  lactulose (CHRONULAC) 10 GM/15ML solution Take 45 mLs (30 g total) by mouth  4 (four) times daily. 05/18/19  Yes Oretha Milch D, MD  loratadine (CLARITIN) 10 MG tablet Take 10 mg by mouth daily as needed for allergies.   Yes [provider]  MAGNESIUM PO Take 500 mg by mouth daily.    Yes [provider]  Multiple Vitamins-Minerals (MULTIVITAMIN WITH MINERALS) tablet Take 1 tablet by mouth daily.     Yes [provider]  NOVOLOG FLEXPEN 100 UNIT/ML FlexPen Inject 12 units under the skin every morning, 8 units at lunch and 12 units at supper 02/19/19  Yes Reed, Tiffany L, DO  ondansetron (ZOFRAN) 4 MG tablet Take 1 tablet (4 mg total) by mouth every 6 (six) hours as needed for nausea. 05/18/19  Yes Oretha Milch D, MD  pantoprazole (PROTONIX) 40 MG tablet Take 1 tablet (40 mg total) by mouth daily. 05/24/19 06/23/19 Yes Reed, Tiffany L, DO  Polyethyl Glycol-Propyl Glycol (SYSTANE OP) Place 1 drop into both eyes daily.    Yes [provider]  Probiotic Product (PROBIOTIC DAILY PO) Take 1 capsule by mouth daily. Digestive Advantage Probiotic   Yes [provider]  rifaximin (XIFAXAN) 550 MG TABS tablet Take 1 tablet (550 mg total) by mouth 2 (two) times daily. 05/18/19  Yes Oretha Milch D, MD  spironolactone (ALDACTONE) 50 MG tablet Take 1 tablet (50 mg total) by mouth daily. 05/24/19  Yes Reed, Tiffany L, DO    Current Facility-Administered Medications  Medication Dose Route Frequency Provider Last Rate Last Admin  . 0.9 %  sodium chloride infusion   Intravenous Continuous Noemi Chapel, MD 250 mL/hr at 05/26/19 1553 New Bag at 05/26/19 1553  . 0.9 %  sodium chloride infusion   Intravenous Continuous Lequita Halt, MD 125 mL/hr at 05/27/19 0556 New Bag at 05/27/19 0556  . acetaminophen (TYLENOL) tablet 500 mg  500 mg Oral QHS Wynetta Fines T, MD   500 mg at 05/27/19 0033  . acidophilus (RISAQUAD) capsule 1 capsule  1 capsule Oral Daily Wynetta Fines T, MD      . ezetimibe (ZETIA) tablet 10 mg  10 mg Oral Daily Wynetta Fines T, MD      .  furosemide (LASIX) tablet 40 mg  40 mg Oral Daily Wynetta Fines T, MD      . insulin aspart (novoLOG) injection 0-9 Units  0-9 Units Subcutaneous TID WC Lequita Halt, MD   2 Units at 05/27/19 [phone]  . lactulose (CHRONULAC) 10 GM/15ML solution 30 g  30 g Oral QID Wynetta Fines T, MD   30 g at 05/27/19 0034  . loratadine (CLARITIN) tablet 10 mg  10 mg Oral Daily PRN Wynetta Fines T, MD      . ondansetron [facility]) injection 4 mg  4 mg Intravenous Q6H PRN Wynetta Fines T, MD   4 mg at 05/26/19 2050  . pantoprazole (PROTONIX) EC tablet 40 mg  40 mg Oral Daily Wynetta Fines T, MD      . polyvinyl alcohol (LIQUIFILM TEARS) 1.4 % ophthalmic solution   Both Eyes Daily Wynetta Fines T, MD      . rifaximin Doreene Nest) tablet 550 mg  550 mg Oral BID Lequita Halt, MD   550 mg at 05/27/19 0034  . spironolactone (ALDACTONE) tablet 50 mg  50 mg Oral Daily Lequita Halt, MD       Current Outpatient Medications  Medication Sig Dispense Refill  . acetaminophen (TYLENOL) 500 MG tablet Take 500 mg by mouth at bedtime.     . Aromatic Inhalants (VICKS VAPOR IN) Vicks Vapor Rub apply small amount to outside of nose to help breathing    . BD PEN NEEDLE NANO U/F 32G X 4 MM MISC USE THREE TIMES DAILY AS DIRECTED 100 each 6  . Biotin 10000 MCG TABS Take  10,000 mcg by mouth every morning.    . bisacodyl (DULCOLAX) 10 MG suppository Place 1 suppository (10 mg total) rectally daily as needed for moderate constipation. 12 suppository 0  . calcium carbonate (OS-CAL) 600 MG TABS Take 600 mg by mouth daily.     . Cholecalciferol (VITAMIN D) 50 MCG (2000 UT) CAPS Take 2,000 Units by mouth daily.     . Cyanocobalamin (VITAMIN B 12 PO) Take 1,000 mcg by mouth daily.      [name] Kitchen ezetimibe (ZETIA) 10 MG tablet TAKE 1 TABLET(10 MG) BY MOUTH DAILY 90 tablet 1  . furosemide (LASIX) 40 MG tablet Take 40 mg by mouth daily.     [name] Kitchen glucose blood test strip One Touch Ultra II strips. Use to test blood sugar three times daily. Dx: E11.65 300 each 3  . insulin  detemir (LEVEMIR) 100 UNIT/ML injection Inject 0.5 mLs (50 Units total) into the skin at bedtime.    . Insulin Syringe-Needle U-100 (INSULIN SYRINGE 1CC/31GX5/16") 31G X 5/16" 1 ML MISC USE AS DIRECTED DAILY WITH LEVEMIR 100 each 2  . JARDIANCE 25 MG TABS tablet Take 25 mg by mouth daily.    [name] Kitchen lactulose (CHRONULAC) 10 GM/15ML solution Take 45 mLs (30 g total) by mouth 4 (four) times daily. 236 mL 0  . loratadine (CLARITIN) 10 MG tablet Take 10 mg by mouth daily as needed for allergies.    [name] Kitchen MAGNESIUM PO Take 500 mg by mouth daily.     . Multiple Vitamins-Minerals (MULTIVITAMIN WITH MINERALS) tablet Take 1 tablet by mouth daily.      [name] Kitchen NOVOLOG FLEXPEN 100 UNIT/ML FlexPen Inject 12 units under the skin every morning, 8 units at lunch and 12 units at supper 15 mL 3  . ondansetron (ZOFRAN) 4 MG tablet Take 1 tablet (4 mg total) by mouth every 6 (six) hours as needed for nausea. 20 tablet 0  . pantoprazole (PROTONIX) 40 MG tablet Take 1 tablet (40 mg total) by mouth daily. 90 tablet 3  . Polyethyl Glycol-Propyl Glycol (SYSTANE OP) Place 1 drop into both eyes daily.     . Probiotic Product (PROBIOTIC DAILY PO) Take 1 capsule by mouth daily. Digestive Advantage Probiotic    . rifaximin (XIFAXAN) 550 MG TABS tablet Take 1 tablet (550 mg total) by mouth 2 (two) times daily. 60 tablet 1  . spironolactone (ALDACTONE) 50 MG tablet Take 1 tablet (50 mg total) by mouth daily.      Allergies as of 05/26/2019 - Review Complete 05/26/2019  Allergen Reaction Noted  . Kiwi extract Anaphylaxis 05/28/2015  . Tdap [tetanus-diphth-acell pertussis] Swelling and Other (See Comments) 03/27/2013  . Statins Other (See Comments) 05/08/2016  . Latex Itching, Dermatitis, and Rash 12/22/2010  . Tramadol Nausea And Vomiting 05/28/2015     Review of Systems:    This is positive for those things mentioned in the HPI. All other review of systems are negative.       Physical Exam:  Vital signs in last 24 hours: Temp:   [98 F (36.7 C)-98.8 F (37.1 C)] 98.8 F (37.1 C) (01/17 2357) Pulse Rate:  [76-89] 79 (01/18 0600) Resp:  [16-24] 17 (01/18 0600) BP: (92-118)/(46-96) 102/46 (01/18 0600) SpO2:  [94 %-99 %] 96 % (01/18 0600)    General:  Elderly and chorincally ill Eyes:  anicteric. ENT:   Mouth and posterior pharynx free of lesions. Moist   Lungs: Clear to auscultation bilaterally.ant Heart:  S1S2, no rubs, murmurs, gallops. Abdomen:  soft,  non-tender, no hepatosplenomegaly, hernia, or mass and BS+.? Mild ascites Extremities:   no edema Neuro:  A&O x 3. No asterixis Psych:  appropriate mood and  Affect.   Data Reviewed:   LAB RESULTS: Recent Labs    05/26/19 1332 05/27/19 0352  WBC 8.8 5.9  HGB 15.0 13.5  HCT 45.3 41.4  PLT 133* 98*   BMET Recent Labs    05/26/19 1332 05/27/19 0352  NA 130* 135  K 4.2 4.2  CL 92* 103  CO2 27 23  GLUCOSE 206* 184*  BUN 22 22  CREATININE 1.51* 1.44*  CALCIUM 13.4* 11.3*   LFT Recent Labs    05/27/19 0352  PROT 5.8*  ALBUMIN 2.2*  AST 53*  ALT 36  ALKPHOS 148*  BILITOT 2.2*   PT/INR No results for input(s): LABPROT, INR in the last 72 hours.  STUDIES: DG Chest Port 1 View  Result Date: 05/26/2019 CLINICAL DATA:  Nausea, vomiting and weakness since last Tuesday. EXAM: PORTABLE CHEST 1 VIEW COMPARISON:  Chest x-ray 05/17/2019 FINDINGS: The cardiac silhouette, mediastinal and hilar contours are within normal limits. Mild tortuosity and calcification of the thoracic aorta. Stable surgical changes from bypass surgery. The lungs are clear of an acute process. No pleural effusions or pulmonary lesions. The bony thorax is intact. IMPRESSION: No acute cardiopulmonary findings. Electronically Signed   By: Marijo Sanes M.D.   On: 05/26/2019 16:02      Thanks   LOS: 1 day   @Rhylynn Perdomo  Simonne Maffucci, MD, [facility] @  05/27/2019, 10:06 AM

## 2019-05-27 NOTE — ED Notes (Signed)
GI at bedside

## 2019-05-27 NOTE — ED Notes (Signed)
Lunch Tray Ordered @ 1034. 

## 2019-05-27 NOTE — ED Notes (Signed)
Pt placed on hospital bed for comfort.

## 2019-05-28 ENCOUNTER — Other Ambulatory Visit: Payer: Self-pay | Admitting: *Deleted

## 2019-05-28 ENCOUNTER — Telehealth: Payer: Self-pay

## 2019-05-28 ENCOUNTER — Ambulatory Visit: Payer: Medicare Other | Admitting: Gastroenterology

## 2019-05-28 DIAGNOSIS — Z95828 Presence of other vascular implants and grafts: Secondary | ICD-10-CM

## 2019-05-28 DIAGNOSIS — K7210 Chronic hepatic failure without coma: Secondary | ICD-10-CM

## 2019-05-28 DIAGNOSIS — E860 Dehydration: Secondary | ICD-10-CM

## 2019-05-28 LAB — CBC
HCT: 39.3 % (ref 36.0–46.0)
Hemoglobin: 13.1 g/dL (ref 12.0–15.0)
MCH: 32.8 pg (ref 26.0–34.0)
MCHC: 33.3 g/dL (ref 30.0–36.0)
MCV: 98.5 fL (ref 80.0–100.0)
Platelets: 79 10*3/uL — ABNORMAL LOW (ref 150–400)
RBC: 3.99 MIL/uL (ref 3.87–5.11)
RDW: 15.9 % — ABNORMAL HIGH (ref 11.5–15.5)
WBC: 6.7 10*3/uL (ref 4.0–10.5)
nRBC: 0 % (ref 0.0–0.2)

## 2019-05-28 LAB — GLUCOSE, CAPILLARY
Glucose-Capillary: 131 mg/dL — ABNORMAL HIGH (ref 70–99)
Glucose-Capillary: 188 mg/dL — ABNORMAL HIGH (ref 70–99)
Glucose-Capillary: 305 mg/dL — ABNORMAL HIGH (ref 70–99)
Glucose-Capillary: 184 mg/dL — ABNORMAL HIGH (ref 70–99)

## 2019-05-28 LAB — RENAL FUNCTION PANEL
Albumin: 2 g/dL — ABNORMAL LOW (ref 3.5–5.0)
Anion gap: 7 (ref 5–15)
BUN: 15 mg/dL (ref 8–23)
CO2: 22 mmol/L (ref 22–32)
Calcium: 9.9 mg/dL (ref 8.9–10.3)
Chloride: 107 mmol/L (ref 98–111)
Creatinine, Ser: 1.17 mg/dL — ABNORMAL HIGH (ref 0.44–1.00)
GFR calc Af Amer: 54 mL/min — ABNORMAL LOW (ref >60)
GFR calc non Af Amer: 47 mL/min — ABNORMAL LOW (ref >60)
Glucose, Bld: 181 mg/dL — ABNORMAL HIGH (ref 70–99)
Phosphorus: 1.6 mg/dL — ABNORMAL LOW (ref 2.5–4.6)
Potassium: 3.6 mmol/L (ref 3.5–5.1)
Sodium: 136 mmol/L (ref 135–145)

## 2019-05-28 LAB — URINE CULTURE: Culture: >=100000 — AB

## 2019-05-28 LAB — MAGNESIUM: Magnesium: 2.3 mg/dL (ref 1.7–2.4)

## 2019-05-28 MED ORDER — SULFAMETHOXAZOLE-TRIMETHOPRIM 800-160 MG PO TABS
1.0000 | ORAL_TABLET | Freq: Two times a day (BID) | ORAL | Status: AC
  Administered 2019-05-28 – 2019-05-31 (×7): 1 via ORAL
  Filled 2019-05-28 (×7): qty 1

## 2019-05-28 MED ORDER — BOOST / RESOURCE BREEZE PO LIQD CUSTOM
1.0000 | Freq: Three times a day (TID) | ORAL | Status: DC
  Administered 2019-05-28 – 2019-06-01 (×7): 1 via ORAL

## 2019-05-28 MED ORDER — SULFAMETHOXAZOLE-TRIMETHOPRIM 800-160 MG PO TABS: 1.0000 | ORAL_TABLET | Freq: Two times a day (BID) | ORAL | Status: DC

## 2019-05-28 NOTE — Patient Outreach (Signed)
[facility]) Care Management  05/28/2019  SHAUNIECE KWAN 1945-11-26 [id number]   Noted that member was admitted to hospital for hypercalcemia and AKI, hospital liaisons aware.  This care manager will follow up with member pending discharge.  [name], [place], MSN [place] [phone]

## 2019-05-28 NOTE — Telephone Encounter (Signed)
I agree

## 2019-05-28 NOTE — Progress Notes (Signed)
PROGRESS NOTE    KAYDANCE BOWIE  JKK:[id number] DOB: [birth year] DOA: 05/26/2019 PCP: Gayland Curry, DO  Brief Narrative:   74 year old lady with prior h/o hypertension, DM, CAD, s/p CABG, non alcoholic cirrhosis admitted for hypercalcemia, dehydration, generalized weakness and fatigue. On arrival to ED, she was found to be hypercalcemic possibly from multiple calcium supplements and vitamin D prior to admission and altered mental status. She was started on IV fluids and calcium has improved.  Gastroenterology consulted for nonalcoholic liver cirrhosis. Patient seen and examined today she is alert and responding to questions appropriately. Assessment & Plan:   Active Problems:   Hypercalcemia  Hypercalcemia:  Unclear etiology, possibly from dehydration..  Calcium improving with IV fluids.  Continue aggressive hydration for another 24 hours. PTH is 9, PTH related peptide pending  Acute metabolic encephalopathy probably secondary to hypercalcemia and AKI Improving, patient is alert and answering questions appropriately   Hyponatremia:  Resolved.   Enterobacter UTI Patient started on Bactrim   Acute kidney injury Probably prerenal renal and dehydration.  Improving with IV fluids.  Repeat renal parameters in the morning.    Diabetes mellitus with hyperglycemia  CBG (last 3)  Recent Labs    05/28/19 0715 05/28/19 1145 05/28/19 1710  GLUCAP 131* 188* 305*   Resume sliding scale insulin   Chronic thrombocytopenia: Platelets have dropped from 133000 to 70,000's.  No signs of bleeding at this time.   Severe protein calorie malnutrition Dietary on board  Liver cirrhosis S/p TIPS for bleeding esophageal varices Hepatic encephalopathy Patient is currently on rifaximin, continue the same and lactulose started by gastroenterology. IR evaluation for the size of TIPS patency/size.  Ultrasound liver Doppler has been ordered for further evaluation.  DVT prophylaxis:  scd's Code Status: full code.  Family Communication: none at bedside, discussed with spouse over the phone.  Disposition Plan: pending clinical improvement.    Consultants:   Gastroenterology.    Procedures: US LIVER  US abdomen.   Antimicrobials: None.   Subjective: She is alert , answering questions appropriately.   Objective: Vitals:   05/28/19 0414 05/28/19 0811 05/28/19 1146 05/28/19 1658  BP: (!) 104/56 (!) 108/54 (!) 105/50 (!) 113/56  Pulse: 82 76 77 87  Resp: 18 17 16 18   Temp: 98.4 F (36.9 C) 98.2 F (36.8 C) 98.3 F (36.8 C) 98 F (36.7 C)  TempSrc: Oral Oral Oral Oral  SpO2: 97% 99% 97% 98%    Intake/Output Summary (Last 24 hours) at 05/28/2019 1709 Last data filed at 05/28/2019 1146 Gross per 24 hour  Intake 2283.82 ml  Output 1350 ml  Net 933.82 ml   There were no vitals filed for this visit.  Examination:  General exam: Appears calm and comfortable not in any kind of distress  respiratory system: Clear to auscultation. Respiratory effort normal. Cardiovascular system: S1 & S2 heard, RRR. ,.No pedal edema. Gastrointestinal system: Abdomen is nondistended, soft and nontender.  Normal bowel sounds heard. Central nervous system: Alert and oriented. Grossly non focal.  Extremities: No cyanosis or clubbing Skin: No rashes,  Psychiatry: Mood is appropriate.     Data Reviewed: I have personally reviewed following labs and imaging studies  CBC: Recent Labs  Lab 05/26/19 1332 05/27/19 0352 05/28/19 1222  WBC 8.8 5.9 6.7  HGB 15.0 13.5 13.1  HCT 45.3 41.4 39.3  MCV 98.9 99.8 98.5  PLT 133* 98* 79*   Basic Metabolic Panel: Recent Labs  Lab 05/26/19 1332 05/26/19 1520 05/26/19  1647 05/27/19 0352 05/28/19 0257  NA 130*  --   --  135 136  K 4.2  --   --  4.2 3.6  CL 92*  --   --  103 107  CO2 27  --   --  23 22  GLUCOSE 206*  --   --  184* 181*  BUN 22  --   --  22 15  CREATININE 1.51*  --   --  1.44* 1.17*  CALCIUM 13.4*  --  13.4*  11.3* 9.9  MG  --  2.7*  --   --  2.3  PHOS  --   --   --   --  1.6*   GFR: Estimated Creatinine Clearance: 39.7 mL/min (A) (by C-G formula based on SCr of 1.17 mg/dL (H)). Liver Function Tests: Recent Labs  Lab 05/26/19 1332 05/27/19 0352 05/28/19 0257  AST 60* 53*  --   ALT 45* 36  --   ALKPHOS 190* 148*  --   BILITOT 3.1* 2.2*  --   PROT 7.4 5.8*  --   ALBUMIN 2.8* 2.2* 2.0*   Recent Labs  Lab 05/26/19 1332  LIPASE 25   Recent Labs  Lab 05/26/19 1521  AMMONIA 41*   Coagulation Profile: No results for input(s): INR, PROTIME in the last 168 hours. Cardiac Enzymes: No results for input(s): CKTOTAL, CKMB, CKMBINDEX, TROPONINI in the last 168 hours. BNP (last 3 results) No results for input(s): PROBNP in the last 8760 hours. HbA1C: No results for input(s): HGBA1C in the last 72 hours. CBG: Recent Labs  Lab 05/27/19 1235 05/27/19 1603 05/27/19 2107 05/28/19 0715 05/28/19 1145  GLUCAP 265* 174* 194* 131* 188*   Lipid Profile: No results for input(s): CHOL, HDL, LDLCALC, TRIG, CHOLHDL, LDLDIRECT in the last 72 hours. Thyroid Function Tests: No results for input(s): TSH, T4TOTAL, FREET4, T3FREE, THYROIDAB in the last 72 hours. Anemia Panel: No results for input(s): VITAMINB12, FOLATE, FERRITIN, TIBC, IRON, RETICCTPCT in the last 72 hours. Sepsis Labs: No results for input(s): PROCALCITON, LATICACIDVEN in the last 168 hours.  Recent Results (from the past 240 hour(s))  Urine culture     Status: Abnormal   Collection Time: 05/26/19  3:07 PM   Specimen: Urine, Clean Catch  Result Value Ref Range Status   Specimen Description URINE, CLEAN CATCH  Final   Special Requests   Final    NONE Performed at [facility], [address], [place], [place] [address]    Culture >=100,000 COLONIES/mL ENTEROBACTER AEROGENES (A)  Final   Report Status 05/28/2019 FINAL  Final   Organism ID, Bacteria ENTEROBACTER AEROGENES (A)  Final      Susceptibility   Enterobacter  aerogenes - MIC*    CEFAZOLIN >=64 RESISTANT Resistant     CEFTRIAXONE <=0.25 SENSITIVE Sensitive     CIPROFLOXACIN <=0.25 SENSITIVE Sensitive     GENTAMICIN <=1 SENSITIVE Sensitive     IMIPENEM 1 SENSITIVE Sensitive     NITROFURANTOIN 128 RESISTANT Resistant     TRIMETH/SULFA <=20 SENSITIVE Sensitive     PIP/TAZO <=4 SENSITIVE Sensitive     * >=100,000 COLONIES/mL ENTEROBACTER AEROGENES  Respiratory Panel by RT PCR (Flu A&B, Covid) - Nasopharyngeal Swab     Status: None   Collection Time: 05/26/19  4:30 PM   Specimen: Nasopharyngeal Swab  Result Value Ref Range Status   SARS Coronavirus 2 by RT PCR NEGATIVE NEGATIVE Final    Comment: (NOTE) SARS-CoV-2 target nucleic acids are NOT  DETECTED. The SARS-CoV-2 RNA is generally detectable in upper respiratoy specimens during the acute phase of infection. The lowest concentration of SARS-CoV-2 viral copies this assay can detect is 131 copies/mL. A negative result does not preclude SARS-Cov-2 infection and should not be used as the sole basis for treatment or other patient management decisions. A negative result may occur with  improper specimen collection/handling, submission of specimen other than nasopharyngeal swab, presence of viral mutation(s) within the areas targeted by this assay, and inadequate number of viral copies (<131 copies/mL). A negative result must be combined with clinical observations, patient history, and epidemiological information. The expected result is Negative. Fact Sheet for Patients:  PinkCheek.be Fact Sheet for Healthcare Providers:  GravelBags.it This test is not yet ap proved or cleared by the [place] FDA and  has been authorized for detection and/or diagnosis of SARS-CoV-2 by FDA under an Emergency Use Authorization (EUA). This EUA will remain  in effect (meaning this test can be used) for the duration of the COVID-19 declaration under Section  564(b)(1) of the Act, 21 U.S.C. section 360bbb-3(b)(1), unless the authorization is terminated or revoked sooner.    Influenza A by PCR NEGATIVE NEGATIVE Final   Influenza B by PCR NEGATIVE NEGATIVE Final    Comment: (NOTE) The Xpert Xpress SARS-CoV-2/FLU/RSV assay is intended as an aid in  the diagnosis of influenza from Nasopharyngeal swab specimens and  should not be used as a sole basis for treatment. Nasal washings and  aspirates are unacceptable for Xpert Xpress SARS-CoV-2/FLU/RSV  testing. Fact Sheet for Patients: PinkCheek.be Fact Sheet for Healthcare Providers: GravelBags.it This test is not yet approved or cleared by the [place] FDA and  has been authorized for detection and/or diagnosis of SARS-CoV-2 by  FDA under an Emergency Use Authorization (EUA). This EUA will remain  in effect (meaning this test can be used) for the duration of the  Covid-19 declaration under Section 564(b)(1) of the Act, 21  U.S.C. section 360bbb-3(b)(1), unless the authorization is  terminated or revoked. Performed at [facility], [place] [address], [place], [place] [address]          Radiology Studies: [place] ASCITES (ABDOMEN LIMITED)  Result Date: 05/27/2019 CLINICAL DATA:  Cirrhosis of the liver. EXAM: LIMITED ABDOMEN ULTRASOUND FOR ASCITES TECHNIQUE: Limited ultrasound survey for ascites was performed in all four abdominal quadrants. COMPARISON:  Abdomen and pelvis CT dated 04/06/2019. FINDINGS: Sonographic survey of the 4 quadrants of the abdomen and midline abdomen demonstrated no sonographically visible free peritoneal fluid. IMPRESSION: No free peritoneal fluid. Electronically Signed   By: Claudie Revering M.D.   On: 05/27/2019 16:50        Scheduled Meds: . acetaminophen  500 mg Oral QHS  . acidophilus  1 capsule Oral Daily  . ezetimibe  10 mg Oral Daily  . feeding supplement  1 Container Oral TID BM  . insulin aspart   0-9 Units Subcutaneous TID WC  . lactulose  30 g Oral QID  . pantoprazole  40 mg Oral Daily  . polyvinyl alcohol   Both Eyes Daily  . rifaximin  550 mg Oral BID   Continuous Infusions: . sodium chloride 150 mL/hr at 05/28/19 1500     LOS: 2 days        Hosie Poisson, MD [facility] 05/28/2019, 5:09 PM

## 2019-05-28 NOTE — Progress Notes (Signed)
IR aware of request to evaluate TIPS size/patency - US liver doppler has been ordered for evaluation and will be reviewed with IR physician once completed.  Please call with questions or concerns.  Candiss Norse, PA-C Pager# [phone]

## 2019-05-28 NOTE — Evaluation (Signed)
Physical Therapy Evaluation Patient Details Name: [name] MRN: [id number] DOB: [birth year] Today's Date: 05/28/2019   History of Present Illness  Patient is a 74 year old Caucasian female with past medical history significant for hypertension, diabetes mellitus, coronary artery disease status post CABG and nonalcoholic cirrhosis.  Patient was admitted with hypercalcemia, volume depletion, weakness and fatigue.  Clinical Impression  Pt admitted with above diagnosis. Pt presents with generalized weakness and quick fatigue with activity. Min A needed for transfers and ambulation. Of note, while walking, pt ran into objects on floor on R side twice. She attributed this to decreased peripheral vision. Will monitor. HHPT had been initiated last time she went home, rec continuance.  Pt currently with functional limitations due to the deficits listed below (see PT Problem List). Pt will benefit from skilled PT to increase their independence and safety with mobility to allow discharge to the venue listed below.       Follow Up Recommendations Home health PT    Equipment Recommendations  None recommended by PT    Recommendations for Other Services       Precautions / Restrictions Precautions Precautions: Fall Restrictions Weight Bearing Restrictions: No      Mobility  Bed Mobility Overal bed mobility: Needs Assistance Bed Mobility: Supine to Sit     Supine to sit: Min guard;HOB elevated     General bed mobility comments: very slow moving but was able to come to EOB with HOB elevated and use of bedrail  Transfers Overall transfer level: Needs assistance Equipment used: Rolling walker (2 wheeled) Transfers: Sit to/from Stand Sit to Stand: Min assist         General transfer comment: min A to steady and for power up  Ambulation/Gait Ambulation/Gait assistance: Min Web designer (Feet): 25 Feet Assistive device: Rolling walker (2 wheeled) Gait  Pattern/deviations: Step-through pattern;Decreased stride length;Trunk flexed Gait velocity: decreased Gait velocity interpretation: <1.31 ft/sec, indicative of household ambulator General Gait Details: pt fatigued very quickly with increased trunk flexion, bumped into objects on R side floor several times including a cord and the EOB  Stairs            Wheelchair Mobility    Modified Rankin (Stroke Patients Only)       Balance Overall balance assessment: Needs assistance Sitting-balance support: No upper extremity supported Sitting balance-Leahy Scale: Fair     Standing balance support: Single extremity supported Standing balance-Leahy Scale: Fair Standing balance comment: pt fatigues very quickly in standing, UE support needed for safety on eval                             Pertinent Vitals/Pain Pain Assessment: No/denies pain    Home Living Family/patient expects to be discharged to:: Private residence Living Arrangements: Spouse/significant other Available Help at Discharge: Family;Available 24 hours/day Type of Home: House Home Access: Stairs to enter Entrance Stairs-Rails: Left Entrance Stairs-Number of Steps: 3 Home Layout: One level Home Equipment: Walker - 2 wheels Additional Comments: lives with husband, has 5 children that live close and help as needed    Prior Function Level of Independence: Needs assistance   Gait / Transfers Assistance Needed: does not use AD when doing well, uses RW when she gets weak  ADL's / Homemaking Assistance Needed: family has been helping since last admission, does not drive        Hand Dominance   Dominant Hand: Right    Extremity/Trunk Assessment  Upper Extremity Assessment Upper Extremity Assessment: Generalized weakness    Lower Extremity Assessment Lower Extremity Assessment: Generalized weakness RLE Deficits / Details: hip flex 3/5, knee ext 4+/5, knee flex 4/5 LLE Deficits / Details: same as  RLE    Cervical / Trunk Assessment Cervical / Trunk Assessment: Kyphotic  Communication   Communication: HOH  Cognition Arousal/Alertness: Awake/alert Behavior During Therapy: Flat affect Overall Cognitive Status: Impaired/Different from baseline Area of Impairment: Problem solving;Following commands;Memory;Safety/judgement                 Orientation Level: Disoriented to;Time;Situation   Memory: Decreased short-term memory Following Commands: Follows one step commands consistently;Follows multi-step commands with increased time Safety/Judgement: Decreased awareness of deficits   Problem Solving: Slow processing General Comments: pt does not remember that it was her last admission that she fell and hurt her hip, thinks that just happened when she came in this time      General Comments General comments (skin integrity, edema, etc.): pt's son present on eval    Exercises     Assessment/Plan    PT Assessment Patient needs continued PT services  PT Problem List Decreased cognition;Decreased mobility;Decreased balance;Decreased activity tolerance;Decreased strength       PT Treatment Interventions Gait training;Balance training;Stair training;Functional mobility training;Therapeutic activities;Patient/family education;Therapeutic exercise;DME instruction    PT Goals (Current goals can be found in the Care Plan section)  Acute Rehab PT Goals Patient Stated Goal: return home PT Goal Formulation: With patient Time For Goal Achievement: 06/11/19 Potential to Achieve Goals: Good    Frequency Min 3X/week   Barriers to discharge        Co-evaluation               AM-PAC PT "6 Clicks" Mobility  Outcome Measure Help needed turning from your back to your side while in a flat bed without using bedrails?: A Little Help needed moving from lying on your back to sitting on the side of a flat bed without using bedrails?: A Little Help needed moving to and from a bed  to a chair (including a wheelchair)?: A Little Help needed standing up from a chair using your arms (e.g., wheelchair or bedside chair)?: A Little Help needed to walk in hospital room?: A Little Help needed climbing 3-5 steps with a railing? : A Lot 6 Click Score: 17    End of Session Equipment Utilized During Treatment: Gait belt Activity Tolerance: Patient tolerated treatment well Patient left: in chair;with call bell/phone within reach;with family/visitor present Nurse Communication: Mobility status PT Visit Diagnosis: Difficulty in walking, not elsewhere classified (R26.2);Muscle weakness (generalized) (M62.81)    Time: 4742-5956 PT Time Calculation (min) (ACUTE ONLY): 29 min   Charges:   PT Evaluation $PT Eval Moderate Complexity: 1 Mod PT Treatments $Gait Training: 8-22 mins        Leighton Roach, [place]  Pager [phone] Office [place] 05/28/2019, 1:33 PM

## 2019-05-28 NOTE — Telephone Encounter (Signed)
I called [name] and gave her the verbal

## 2019-05-28 NOTE — Progress Notes (Addendum)
Daily Rounding Note  05/28/2019, 11:14 AM  LOS: 2 days   SUBJECTIVE:   Chief complaint:     2 BM's today, loose.  At home was only having BM's every few days.  No pain.  tolerating carb mod diet, no nausea.    OBJECTIVE:         Vital signs in last 24 hours:    Temp:  [97.8 F (36.6 C)-98.4 F (36.9 C)] 98.2 F (36.8 C) (01/19 0811) Pulse Rate:  [76-85] 76 (01/19 0811) Resp:  [16-20] 17 (01/19 0811) BP: (95-109)/(44-56) 108/54 (01/19 0811) SpO2:  [96 %-99 %] 99 % (01/19 0811) Last BM Date: 05/27/19 There were no vitals filed for this visit. General: frail, ill looking.  Alert, comfortable Heart: rrr w murmer Chest: crackles in bases.  No dyspnea or cough Abdomen: soft, NT, ND.  Active BS  Extremities: no CCE Neuro/Psych:  Oriented x 3.  No asterixis.    Intake/Output from previous day: 01/18 0701 - 01/19 0700 In: 131.6 [I.V.:131.6] Out: 1350 [Urine:1350]  Lab Results: Recent Labs    05/26/19 1332 05/27/19 0352  WBC 8.8 5.9  HGB 15.0 13.5  HCT 45.3 41.4  PLT 133* 98*   BMET Recent Labs    05/26/19 1332 05/26/19 1647 05/27/19 0352 05/28/19 0257  NA 130*  --  135 136  K 4.2  --  4.2 3.6  CL 92*  --  103 107  CO2 27  --  23 22  GLUCOSE 206*  --  184* 181*  BUN 22  --  22 15  CREATININE 1.51*  --  1.44* 1.17*  CALCIUM 13.4* 13.4* 11.3* 9.9   LFT Recent Labs    05/26/19 1332 05/27/19 0352 05/28/19 0257  PROT 7.4 5.8*  --   ALBUMIN 2.8* 2.2* 2.0*  AST 60* 53*  --   ALT 45* 36  --   ALKPHOS 190* 148*  --   BILITOT 3.1* 2.2*  --    PT/INR No results for input(s): LABPROT, INR in the last 72 hours. Hepatitis Panel No results for input(s): HEPBSAG, HCVAB, HEPAIGM, HEPBIGM in the last 72 hours.  Studies/Results:   [place] ASCITES (ABDOMEN LIMITED)  Result Date: 05/27/2019 CLINICAL DATA:  Cirrhosis of the liver. EXAM: LIMITED ABDOMEN ULTRASOUND FOR ASCITES TECHNIQUE: Limited ultrasound survey  for ascites was performed in all four abdominal quadrants. COMPARISON:  Abdomen and pelvis CT dated 04/06/2019. FINDINGS: Sonographic survey of the 4 quadrants of the abdomen and midline abdomen demonstrated no sonographically visible free peritoneal fluid. IMPRESSION: No free peritoneal fluid. Electronically Signed   By: Claudie Revering M.D.   On: 05/27/2019 16:50   Scheduled Meds: . acetaminophen  500 mg Oral QHS  . acidophilus  1 capsule Oral Daily  . ezetimibe  10 mg Oral Daily  . insulin aspart  0-9 Units Subcutaneous TID WC  . lactulose  30 g Oral QID  . pantoprazole  40 mg Oral Daily  . polyvinyl alcohol   Both Eyes Daily  . rifaximin  550 mg Oral BID   Continuous Infusions: . sodium chloride 150 mL/hr at 05/28/19 0749   PRN Meds:.loratadine, [DISCONTINUED] ondansetron **OR** ondansetron (ZOFRAN) IV   ASSESMENT:   *   S/p TIPS for bleeding esophageal varices.  *    Hepatic encephalopathy developing after TIPS placement.  Rifaximin, lactulose initiated Currently on Chronulac 30 g qid, rifaximin 550 mg bid: her home doses.    *  Nausea vomiting, suspect due to new hypercalcemia and not her new meds lactulose, Xifaxan. Calcium supplements and vitamin D on hold, aggressive hydration in place.  PTH 9, PTH related peptide pndg.   Calcium level normalized.  Corrected for hypoalbuminemia the calcium is still normal at 11.5.  *   Hyponatremia.  Corrected, normal.    *    AKI improved.  *     NASH cirrhosis.  *   Severe protein calorie malnutrition.  *    Chronic thrombocytopenia.    PLAN   *  From GI standpoint ok to discharge.  Has appt w Dr [name] for 2/9.    *   Will need calcium rechecked in several days after discharge.    *  Son is wondering if it is ok for pt to drink nutritional supplements which invariably contain calcium?   Azucena Freed  05/28/2019, 11:14 AM Phone [place] Attending   I have taken an interval history, reviewed the chart  and examined the patient. I agree with the Advanced Practitioner's note, impression and recommendations.   She is better Ca++ NL  PTH was 9 so seems like this was exogenous Ca++ and dehydration?   We will be available if needed  The idea to change size of TIPS would be up to IR   Call if ?  She should stay on current doses of lactulose and Xifaxan and not take Ca++ supplemnts   Ca++ in nutritional supplements ?  can check with dietitian  Gatha Mayer, MD, [facility] 05/28/2019 5:53 PM

## 2019-05-28 NOTE — Progress Notes (Signed)
Initial Nutrition Assessment  DOCUMENTATION CODES:   Not applicable  INTERVENTION:  Provide Boost Breeze po TID, each supplement provides 250 kcal and 9 grams of protein  48 hour calorie count initiated via MD.   Encourage adequate PO intake.   NUTRITION DIAGNOSIS:   Increased nutrient needs related to chronic illness(cirrhosis) as evidenced by estimated needs.  GOAL:   Patient will meet greater than or equal to 90% of their needs  MONITOR:   PO intake, Supplement acceptance, Skin, Weight trends, Labs, I & O's  REASON FOR ASSESSMENT:   Consult Calorie Count  ASSESSMENT:   74 year old Caucasian female with past medical history significant for hypertension, diabetes mellitus, coronary artery disease status post CABG and nonalcoholic cirrhosis.  Patient was admitted with hypercalcemia, volume depletion, weakness and fatigue. Per MD, continue to hold vitamin D and calcium supplement   Pt unavailable during attempted time of contact. RD unable to obtain pt nutrition history at this time. RD to order nutritional supplements to aid in caloric and protein needs. Will order Boost Breeze which provides a very insignificant amount of calcium (10 mg) per serving. Calorie count has been ordered via MD. RD to follow up tomorrow for calorie count results.   Unable to complete Nutrition-Focused physical exam at this time.   Labs and medications reviewed.   Diet Order:   Diet Order            Diet Carb Modified Fluid consistency: Thin; Room service appropriate? Yes  Diet effective now              EDUCATION NEEDS:   Not appropriate for education at this time  Skin:  Skin Assessment: Reviewed RN Assessment  Last BM:  1/18  Height:   Ht Readings from Last 1 Encounters:  05/16/19 5' 3"  (1.6 m)    Weight:   Wt Readings from Last 1 Encounters:  05/17/19 65.8 kg    Ideal Body Weight:  52.27 kg  BMI:  There is no height or weight on file to calculate BMI.  Estimated  Nutritional Needs:   Kcal:  1750-1900  Protein:  85-100 grams  Fluid:  >/= 1.7 L/day    Corrin Parker, MS, RD, LDN Pager # [phone] After hours/ weekend pager # [phone]

## 2019-05-28 NOTE — Consult Note (Signed)
   [facility] CM Inpatient Consult   05/28/2019  [name] 09-26-1945 [id number]   Patient is currently active with [facility] for chronic disease management services.  Patient has been engaged by a [facility].  Our community based plan of care has focused on disease management and community resource support.    Notified [facility] Coordinator of patient's hospitalization 05/27/2019 as patient was in the ED.   Plan: Will follow up with  Inpatient Transition Of Care [TOC] team member to make aware that [facility] following.   Of note, [facility] Care Management services does not replace or interfere with any services that are needed or arranged by inpatient [facility] care management team.  For additional questions or referrals please contact:  Natividad Brood, RN BSN [facility] Liaison  [phone] business mobile phone Toll free office [phone]  Fax number: [phone] [email or web address]

## 2019-05-28 NOTE — Telephone Encounter (Signed)
[facility] would like a verbal order for nursing 1wl, 2wl, 1w2, 2prn phone number is [phone]

## 2019-05-29 ENCOUNTER — Other Ambulatory Visit: Payer: Medicare Other

## 2019-05-29 ENCOUNTER — Inpatient Hospital Stay (HOSPITAL_COMMUNITY): Payer: Medicare Other

## 2019-05-29 DIAGNOSIS — E876 Hypokalemia: Secondary | ICD-10-CM

## 2019-05-29 DIAGNOSIS — Z794 Long term (current) use of insulin: Secondary | ICD-10-CM

## 2019-05-29 DIAGNOSIS — E43 Unspecified severe protein-calorie malnutrition: Secondary | ICD-10-CM

## 2019-05-29 DIAGNOSIS — E119 Type 2 diabetes mellitus without complications: Secondary | ICD-10-CM

## 2019-05-29 DIAGNOSIS — R160 Hepatomegaly, not elsewhere classified: Secondary | ICD-10-CM

## 2019-05-29 DIAGNOSIS — D693 Immune thrombocytopenic purpura: Secondary | ICD-10-CM

## 2019-05-29 LAB — GLUCOSE, CAPILLARY
Glucose-Capillary: 136 mg/dL — ABNORMAL HIGH (ref 70–99)
Glucose-Capillary: 200 mg/dL — ABNORMAL HIGH (ref 70–99)
Glucose-Capillary: 256 mg/dL — ABNORMAL HIGH (ref 70–99)

## 2019-05-29 LAB — RENAL FUNCTION PANEL
Albumin: 1.9 g/dL — ABNORMAL LOW (ref 3.5–5.0)
Anion gap: 6 (ref 5–15)
BUN: 13 mg/dL (ref 8–23)
CO2: 20 mmol/L — ABNORMAL LOW (ref 22–32)
Calcium: 9.2 mg/dL (ref 8.9–10.3)
Chloride: 109 mmol/L (ref 98–111)
Creatinine, Ser: 0.97 mg/dL (ref 0.44–1.00)
GFR calc Af Amer: >60 mL/min (ref >60)
GFR calc non Af Amer: 58 mL/min — ABNORMAL LOW (ref >60)
Glucose, Bld: 175 mg/dL — ABNORMAL HIGH (ref 70–99)
Phosphorus: 1.7 mg/dL — ABNORMAL LOW (ref 2.5–4.6)
Potassium: 3.1 mmol/L — ABNORMAL LOW (ref 3.5–5.1)
Sodium: 135 mmol/L (ref 135–145)

## 2019-05-29 MED ORDER — PRO-STAT SUGAR FREE PO LIQD
30.0000 mL | Freq: Two times a day (BID) | ORAL | Status: DC
  Administered 2019-05-29 – 2019-05-30 (×3): 30 mL via ORAL
  Filled 2019-05-29 (×2): qty 30

## 2019-05-29 MED ORDER — POTASSIUM CHLORIDE CRYS ER 20 MEQ PO TBCR
40.0000 meq | EXTENDED_RELEASE_TABLET | Freq: Two times a day (BID) | ORAL | Status: DC
  Administered 2019-05-29 – 2019-05-30 (×3): 40 meq via ORAL
  Filled 2019-05-29 (×2): qty 2

## 2019-05-29 MED ORDER — SODIUM PHOSPHATES 45 MMOLE/15ML IV SOLN
10.0000 mmol | Freq: Once | INTRAVENOUS | Status: DC
  Filled 2019-05-29: qty 3.33

## 2019-05-29 MED ORDER — INSULIN ASPART 100 UNIT/ML ~~LOC~~ SOLN
3.0000 [IU] | Freq: Three times a day (TID) | SUBCUTANEOUS | Status: DC
  Administered 2019-05-30 – 2019-06-01 (×8): 3 [IU] via SUBCUTANEOUS

## 2019-05-29 NOTE — Progress Notes (Signed)
US liver doppler performed today and read by Dr. Kathlene Cote (IR) which notes widely patent TIPS shunt with normal velocities.  Additionally, a mass in the right liver lobe suspicious for malignancy was noted - would recommend obtaining AFP and MRI when patient is able to follow commands appropriately.   IR available as needed - please call with questions or concerns.   Candiss Norse, PA-C

## 2019-05-29 NOTE — Progress Notes (Signed)
[name]  PROGRESS NOTE    COLBI SCHILTZ  NKN:[id number] DOB: [birth year] DOA: 05/26/2019 PCP: Gayland Curry, DO   Brief Narrative:   74 year old lady with prior h/o hypertension, DM, CAD, s/p CABG, non alcoholic cirrhosis admitted for hypercalcemia, dehydration, generalized weakness and fatigue. On arrival to ED, she was found to be hypercalcemic possibly from multiple calcium supplements and vitamin D prior to admission and altered mental status. She was started on IV fluids and calcium has improved.  Gastroenterology consulted for nonalcoholic liver cirrhosis. Patient seen and examined today she is alert and responding to questions appropriately.  05/29/19: Doesn't appear to be keeping up with nutrition needs. Ordering K+ and phos today. Continue fluids, but reduce the rates. Likely bactrim through tomorrow at least. Updated husband at bedside.    Assessment & Plan:   Active Problems:   Encephalopathy, hepatic (HCC)   Hypercalcemia   S/P TIPS (transjugular intrahepatic portosystemic shunt)  Hypercalcemia     - Unclear etiology, possibly from dehydration     - improving; corrected Ca2+ 10.9; can decrease fluid rate     - PTH is 9, PTH related peptide pending     Acute metabolic encephalopathy      - likely secondary to hypercalcemia and possible hepatic issues     - interviewed appropriately this AM  Hyponatremia  Hypophosphatemia Hypokalemia     - Na+ resolved with fluids     - add K+ and IV phos   Enterobacter UTI     - Day 2 bactrim; continue for now  Acute kidney injury     - resolved w/ fluids  DM2     - SSI, 3 units novolog WM  Chronic thrombocytopenia:     - Platelets have dropped from 133000 to 70,000's     - no signs of frank bleed     - likely d/t liver disease  Severe protein calorie malnutrition     - appreciate dietary assistance     - calorie counts  Liver cirrhosis     - s/p TIPS for bleeding esophageal varices     - Patient is currently on  rifaximin, continue the same and lactulose started by gastroenterology.     - IR evaluation for the size of TIPS patency/size: liver doppler has been ordered for further evaluation; TIPS shunt widely patent w/ normal velocities per radiology; rec obtaining AFP and MRI d/t right liver lobe mass seen on doppler  Severe debility and deconditioning     - Likely from CVA and prolonged hospitalization     - Continue PT inpatient hospital.    Unwitnessed fall on 04/29/2019     - Fell at bedside chair when attempted to transfer to commode.       - Denies hitting his head; No loss of consciousness; No new focal neurologic deficit at that time.     - Continue fall precautions  Liver mass     - check AFP     - check MR liver  DVT prophylaxis: SCD Code Status: FULL Family Communication: Spoke with husband at bedside.   Disposition Plan: Doesn't look like she's keeping up with nutrition needs. Still need lytes in order.    Antimicrobials:   bactrim   ROS:  Denies CP, N, V, ab pain . Remainder 10-pt ROS is negative for all not previously mentioned.  Subjective: "I get full ease. Then I get nauseous."  Objective: Vitals:   05/29/19 0001 05/29/19 0315 05/29/19 3790  05/29/19 1101  BP: (!) 112/55 (!) 113/51 (!) 115/52 (!) 103/56  Pulse: 84 85 84 79  Resp: 18 18 20 18   Temp: 98 F (36.7 C) 98.3 F (36.8 C) 97.8 F (36.6 C) 97.8 F (36.6 C)  TempSrc: Oral Oral Oral Oral  SpO2: 96% 95% 95% 100%    Intake/Output Summary (Last 24 hours) at 05/29/2019 1452 Last data filed at 05/29/2019 8119 Gross per 24 hour  Intake 0 ml  Output 200 ml  Net -200 ml   There were no vitals filed for this visit.  Examination:  General: 74 y.o. female resting in bed in NAD Cardiovascular: RRR, +S1, S2, no m/g/r, equal pulses throughout Respiratory: CTABL, no w/r/r, normal WOB GI: BS+, NDNT, no masses noted, no organomegaly noted MSK: No e/c/c Neuro: alert to name, follows commands Psyc: Appropriate  interaction and affect, calm/cooperative   Data Reviewed: I have personally reviewed following labs and imaging studies.  CBC: Recent Labs  Lab 05/26/19 1332 05/27/19 0352 05/28/19 1222  WBC 8.8 5.9 6.7  HGB 15.0 13.5 13.1  HCT 45.3 41.4 39.3  MCV 98.9 99.8 98.5  PLT 133* 98* 79*   Basic Metabolic Panel: Recent Labs  Lab 05/26/19 1332 05/26/19 1520 05/26/19 1647 05/27/19 0352 05/28/19 0257 05/29/19 0256  NA 130*  --   --  135 136 135  K 4.2  --   --  4.2 3.6 3.1*  CL 92*  --   --  103 107 109  CO2 27  --   --  23 22 20*  GLUCOSE 206*  --   --  184* 181* 175*  BUN 22  --   --  22 15 13   CREATININE 1.51*  --   --  1.44* 1.17* 0.97  CALCIUM 13.4*  --  13.4* 11.3* 9.9 9.2  MG  --  2.7*  --   --  2.3  --   PHOS  --   --   --   --  1.6* 1.7*   GFR: Estimated Creatinine Clearance: 47.8 mL/min (by C-G formula based on SCr of 0.97 mg/dL). Liver Function Tests: Recent Labs  Lab 05/26/19 1332 05/27/19 0352 05/28/19 0257 05/29/19 0256  AST 60* 53*  --   --   ALT 45* 36  --   --   ALKPHOS 190* 148*  --   --   BILITOT 3.1* 2.2*  --   --   PROT 7.4 5.8*  --   --   ALBUMIN 2.8* 2.2* 2.0* 1.9*   Recent Labs  Lab 05/26/19 1332  LIPASE 25   Recent Labs  Lab 05/26/19 1521  AMMONIA 41*   Coagulation Profile: No results for input(s): INR, PROTIME in the last 168 hours. Cardiac Enzymes: No results for input(s): CKTOTAL, CKMB, CKMBINDEX, TROPONINI in the last 168 hours. BNP (last 3 results) No results for input(s): PROBNP in the last 8760 hours. HbA1C: No results for input(s): HGBA1C in the last 72 hours. CBG: Recent Labs  Lab 05/28/19 1145 05/28/19 1710 05/28/19 2125 05/29/19 0610 05/29/19 1111  GLUCAP 188* 305* 184* 136* 200*   Lipid Profile: No results for input(s): CHOL, HDL, LDLCALC, TRIG, CHOLHDL, LDLDIRECT in the last 72 hours. Thyroid Function Tests: No results for input(s): TSH, T4TOTAL, FREET4, T3FREE, THYROIDAB in the last 72 hours. Anemia  Panel: No results for input(s): VITAMINB12, FOLATE, FERRITIN, TIBC, IRON, RETICCTPCT in the last 72 hours. Sepsis Labs: No results for input(s): PROCALCITON, LATICACIDVEN in the last 168 hours.  Recent Results (from the past 240 hour(s))  Urine culture     Status: Abnormal   Collection Time: 05/26/19  3:07 PM   Specimen: Urine, Clean Catch  Result Value Ref Range Status   Specimen Description URINE, CLEAN CATCH  Final   Special Requests   Final    NONE Performed at [facility], [address] [place], [place] [address]    Culture >=100,000 COLONIES/mL ENTEROBACTER AEROGENES (A)  Final   Report Status 05/28/2019 FINAL  Final   Organism ID, Bacteria ENTEROBACTER AEROGENES (A)  Final      Susceptibility   Enterobacter aerogenes - MIC*    CEFAZOLIN >=64 RESISTANT Resistant     CEFTRIAXONE <=0.25 SENSITIVE Sensitive     CIPROFLOXACIN <=0.25 SENSITIVE Sensitive     GENTAMICIN <=1 SENSITIVE Sensitive     IMIPENEM 1 SENSITIVE Sensitive     NITROFURANTOIN 128 RESISTANT Resistant     TRIMETH/SULFA <=20 SENSITIVE Sensitive     PIP/TAZO <=4 SENSITIVE Sensitive     * >=100,000 COLONIES/mL ENTEROBACTER AEROGENES  Respiratory Panel by RT PCR (Flu A&B, Covid) - Nasopharyngeal Swab     Status: None   Collection Time: 05/26/19  4:30 PM   Specimen: Nasopharyngeal Swab  Result Value Ref Range Status   SARS Coronavirus 2 by RT PCR NEGATIVE NEGATIVE Final    Comment: (NOTE) SARS-CoV-2 target nucleic acids are NOT DETECTED. The SARS-CoV-2 RNA is generally detectable in upper respiratoy specimens during the acute phase of infection. The lowest concentration of SARS-CoV-2 viral copies this assay can detect is 131 copies/mL. A negative result does not preclude SARS-Cov-2 infection and should not be used as the sole basis for treatment or other patient management decisions. A negative result may occur with  improper specimen collection/handling, submission of specimen other than  nasopharyngeal swab, presence of viral mutation(s) within the areas targeted by this assay, and inadequate number of viral copies (<131 copies/mL). A negative result must be combined with clinical observations, patient history, and epidemiological information. The expected result is Negative. Fact Sheet for Patients:  PinkCheek.be Fact Sheet for Healthcare Providers:  GravelBags.it This test is not yet ap proved or cleared by the [place] FDA and  has been authorized for detection and/or diagnosis of SARS-CoV-2 by FDA under an Emergency Use Authorization (EUA). This EUA will remain  in effect (meaning this test can be used) for the duration of the COVID-19 declaration under Section 564(b)(1) of the Act, 21 U.S.C. section 360bbb-3(b)(1), unless the authorization is terminated or revoked sooner.    Influenza A by PCR NEGATIVE NEGATIVE Final   Influenza B by PCR NEGATIVE NEGATIVE Final    Comment: (NOTE) The Xpert Xpress SARS-CoV-2/FLU/RSV assay is intended as an aid in  the diagnosis of influenza from Nasopharyngeal swab specimens and  should not be used as a sole basis for treatment. Nasal washings and  aspirates are unacceptable for Xpert Xpress SARS-CoV-2/FLU/RSV  testing. Fact Sheet for Patients: PinkCheek.be Fact Sheet for Healthcare Providers: GravelBags.it This test is not yet approved or cleared by the [place] FDA and  has been authorized for detection and/or diagnosis of SARS-CoV-2 by  FDA under an Emergency Use Authorization (EUA). This EUA will remain  in effect (meaning this test can be used) for the duration of the  Covid-19 declaration under Section 564(b)(1) of the Act, 21  U.S.C. section 360bbb-3(b)(1), unless the authorization is  terminated or revoked. Performed at [facility], [place],  [place] [address]        Radiology Studies: [place] ASCITES (ABDOMEN LIMITED)  Result Date: 05/27/2019 CLINICAL DATA:  Cirrhosis of the liver. EXAM: LIMITED ABDOMEN ULTRASOUND FOR ASCITES TECHNIQUE: Limited ultrasound survey for ascites was performed in all four abdominal quadrants. COMPARISON:  Abdomen and pelvis CT dated 04/06/2019. FINDINGS: Sonographic survey of the 4 quadrants of the abdomen and midline abdomen demonstrated no sonographically visible free peritoneal fluid. IMPRESSION: No free peritoneal fluid. Electronically Signed   By: Claudie Revering M.D.   On: 05/27/2019 16:50   US LIVER DOPPLER  Result Date: 05/29/2019 CLINICAL DATA:  History of TIPS placement on 04/08/2019 to treat bleeding esophageal varices. History of cirrhosis and portal hypertension. Admission currently for treatment of hepatic encephalopathy. Assessment of TIPS patency and internal velocities. EXAM: DUPLEX ULTRASOUND OF LIVER AND TIPS SHUNT TECHNIQUE: Color and duplex Doppler ultrasound was performed to evaluate the hepatic in-flow and out-flow vessels. COMPARISON:  TIPS procedure on 04/08/2019 and CT of the abdomen on 04/06/2019. FINDINGS: Portal Vein Velocities Main:  79 cm/sec No evidence of portal vein thrombus. Hepatopetal flow towards the liver. TIPS Stent Velocities Proximal:  177 cm/sec Mid:  129 Distal:  201 cm/sec No visible stenosis or thrombus. IVC: Patent.  Resent and patent with normal respiratory phasicity. Hepatic Vein Velocities Right:  Not able to be sample due to overlying TIPS shunt. Mid:  45 cm/sec Left:  31 cm/sec Splenic Vein: 49 Superior Mesenteric Vein: Not visualized. Hepatic Artery: 149 Ascites: Small amount of ascites visualized adjacent to the liver. Varices: None visualized. Other findings: Within the liver, a focal heterogeneous rounded area is noted in the right lobe measuring roughly 3.6 x 3.8 x 2.8 cm. This is suspicious for a hepatic mass. No evidence of biliary ductal dilatation. The liver demonstrates morphology  consistent with cirrhosis. IMPRESSION: 1. Widely patent TIPS shunt with normal velocities. 2. Mass-like area in the right lobe of the liver suspicious for malignancy measuring up to approximately 3.8 cm in diameter. Recommend correlation with serum AFP level and further imaging correlation with MRI of the abdomen with and without gadolinium. When the patient is clinically stable and able to follow directions and hold their breath (preferably as an outpatient) further evaluation with dedicated abdominal MRI should be considered. Electronically Signed   By: Aletta Edouard M.D.   On: 05/29/2019 11:07     Scheduled Meds:  acetaminophen  500 mg Oral QHS   acidophilus  1 capsule Oral Daily   ezetimibe  10 mg Oral Daily   feeding supplement  1 Container Oral TID BM   insulin aspart  0-9 Units Subcutaneous TID WC   lactulose  30 g Oral QID   pantoprazole  40 mg Oral Daily   polyvinyl alcohol   Both Eyes Daily   rifaximin  550 mg Oral BID   sulfamethoxazole-trimethoprim  1 tablet Oral Q12H   Continuous Infusions:  sodium chloride 150 mL/hr at 05/29/19 0552     LOS: 3 days    Time spent: 35 minutes spent in the coordination of care today.    Jonnie Finner, DO [facility]  If 7PM-7AM, please contact night-coverage [email or web address] 05/29/2019, 2:52 PM

## 2019-05-29 NOTE — Progress Notes (Signed)
Calorie Count Note  48 hour calorie count ordered.  Diet: Carbohydrate modified diet with thin liquids Supplements: Boost Breeze po TID, each supplement provides 250 kcal and 9 grams of protein.  Breakfast: 330 kcal, 6 grams of protein Lunch: No information provided Dinner: 240 kcal, 26 grams of protein Supplements: 500 kcal, 18 grams of protein  Day 1 Total intake: 1070 kcal (61% of kcal needs)  50 grams of protein (59% of protein needs)  Estimated Nutritional Needs:  Kcal:  1750-1900 Protein:  85-100 grams Fluid:  >/= 1.7 L/day  Nutrition Dx:  Increased nutrient needs related to chronic illness(cirrhosis) as evidenced by estimated needs; ongoing  Goal:  Pt to meet >/= 90% of their estimated nutrition needs; progressing  Intervention:  Continue calorie count.  Continue Boost Breeze po TID, each supplement provides 250 kcal and 9 grams of protein.  Provide 30 ml Prostat po BID, each supplement provides 100 kcal and 15 grams of protein.   Encourage adequate PO intake.   Kayla Parker, MS, RD, LDN Pager # [phone] After hours/ weekend pager # [phone]

## 2019-05-29 NOTE — Progress Notes (Addendum)
Physical Therapy Treatment Patient Details Name: [name] MRN: [id number] DOB: 02/12/1946 Today's Date: 05/29/2019    History of Present Illness Patient is a 74 year old Caucasian female with past medical history significant for hypertension, diabetes mellitus, coronary artery disease status post CABG and nonalcoholic cirrhosis.  Patient was admitted with hypercalcemia, volume depletion, weakness and fatigue.    PT Comments    Pt continues to require external assistance to mobilize.  Speaking with husband she appears to be closer to her baseline. She continues to benefit from rehab in a [place] setting.     Follow Up Recommendations  Home health PT     Equipment Recommendations  None recommended by PT    Recommendations for Other Services       Precautions / Restrictions Precautions Precautions: Fall Restrictions Weight Bearing Restrictions: No    Mobility  Bed Mobility Overal bed mobility: Needs Assistance Bed Mobility: Supine to Sit;Sit to Supine     Supine to sit: Min guard;HOB elevated Sit to supine: Mod assist   General bed mobility comments: Pt remains very slow to come to sitting , required moderate asssistance to return back to bed due to fatigue.  Transfers Overall transfer level: Needs assistance Equipment used: Rolling walker (2 wheeled) Transfers: Sit to/from Stand Sit to Stand: Min assist         General transfer comment: min assistance from bed and moderrate assistance from bed side commode.  Ambulation/Gait Ambulation/Gait assistance: Min assist Gait Distance (Feet): 80 Feet Assistive device: Rolling walker (2 wheeled) Gait Pattern/deviations: Step-through pattern;Decreased stride length;Trunk flexed;Shuffle Gait velocity: decreased   General Gait Details: Pt with B shuffling L>R.  She required assistance to turn and back to seated surface.  Pt continues to demonstrate with poor obstacle negotiation.   Stairs              Wheelchair Mobility    Modified Rankin (Stroke Patients Only)       Balance Overall balance assessment: Needs assistance Sitting-balance support: No upper extremity supported Sitting balance-Leahy Scale: Fair       Standing balance-Leahy Scale: Fair                              Cognition Arousal/Alertness: Awake/alert Behavior During Therapy: Flat affect Overall Cognitive Status: Impaired/Different from baseline Area of Impairment: Problem solving;Following commands;Memory;Safety/judgement                 Orientation Level: Situation   Memory: Decreased short-term memory Following Commands: Follows one step commands consistently;Follows multi-step commands with increased time Safety/Judgement: Decreased awareness of deficits   Problem Solving: Slow processing General Comments: pt does not remember that it was her last admission that she fell and hurt her hip, thinks that just happened when she came in this time      Exercises General Exercises - Lower Extremity Ankle Circles/Pumps: AROM;Both;20 reps;Supine Quad Sets: AROM;Both;10 reps;Supine Heel Slides: AROM;Both;10 reps;Supine Hip ABduction/ADduction: AROM;Both;10 reps;Supine Straight Leg Raises: AAROM;Both;10 reps;Supine    General Comments        Pertinent Vitals/Pain Pain Assessment: Faces Faces Pain Scale: No hurt    Home Living                      Prior Function            PT Goals (current goals can now be found in the care plan section) Acute Rehab PT Goals Patient Stated  Goal: return home Potential to Achieve Goals: Good Progress towards PT goals: Progressing toward goals    Frequency    Min 3X/week      PT Plan Current plan remains appropriate    Co-evaluation              AM-PAC PT "6 Clicks" Mobility   Outcome Measure  Help needed turning from your back to your side while in a flat bed without using bedrails?: A Little Help needed moving  from lying on your back to sitting on the side of a flat bed without using bedrails?: A Little Help needed moving to and from a bed to a chair (including a wheelchair)?: A Little Help needed standing up from a chair using your arms (e.g., wheelchair or bedside chair)?: A Little Help needed to walk in hospital room?: A Little Help needed climbing 3-5 steps with a railing? : A Lot 6 Click Score: 17    End of Session Equipment Utilized During Treatment: Gait belt Activity Tolerance: Patient tolerated treatment well Patient left: in chair;with call bell/phone within reach;with family/visitor present Nurse Communication: Mobility status PT Visit Diagnosis: Difficulty in walking, not elsewhere classified (R26.2);Muscle weakness (generalized) (M62.81)     Time: 8921-1941 PT Time Calculation (min) (ACUTE ONLY): 37 min  Charges:  $Gait Training: 8-22 mins $Therapeutic Exercise: 8-22 mins                     Erasmo Leventhal , PTA Acute Rehabilitation Services Pager [phone] Office [phone]     [name] [name] 05/29/2019, 5:21 PM

## 2019-05-29 NOTE — Progress Notes (Signed)
Inpatient Diabetes Program Recommendations  AACE/ADA: New Consensus Statement on Inpatient Glycemic Control (2015)  Target Ranges:  Prepandial:   less than 140 mg/dL      Peak postprandial:   less than 180 mg/dL (1-2 hours)      Critically ill patients:  140 - 180 mg/dL   Lab Results  Component Value Date   GLUCAP 200 (H) 05/29/2019   HGBA1C 8.4 (H) 04/01/2019    Review of Glycemic Control Results for EVIANNA, [name] (MRN [id number]) as of 05/29/2019 14:08  Ref. Range 05/28/2019 11:45 05/28/2019 17:10 05/28/2019 21:25 05/29/2019 06:10 05/29/2019 11:11  Glucose-Capillary Latest Ref Range: 70 - 99 mg/dL 188 (H) 305 (H) 184 (H) 136 (H) 200 (H)   Diabetes history: DM 2 Outpatient Diabetes medications: Levemir 50 units q HS, Novolog 12 units with breakfast and supper and Novolog 8 units with lunch Current orders for Inpatient glycemic control:  Novolog sensitive tid with meals   Inpatient Diabetes Program Recommendations:    Consider adding Novolog meal coverage 3 units tid with meals.  Also consider adding Levemir 10 units q HS.   Thanks  Adah Perl, RN, BC-ADM Inpatient Diabetes Coordinator Pager [phone] (8a-5p)

## 2019-05-30 ENCOUNTER — Inpatient Hospital Stay (HOSPITAL_COMMUNITY): Payer: Medicare Other

## 2019-05-30 DIAGNOSIS — E871 Hypo-osmolality and hyponatremia: Secondary | ICD-10-CM

## 2019-05-30 LAB — RENAL FUNCTION PANEL
Albumin: 2.1 g/dL — ABNORMAL LOW (ref 3.5–5.0)
Anion gap: 8 (ref 5–15)
BUN: 13 mg/dL (ref 8–23)
CO2: 20 mmol/L — ABNORMAL LOW (ref 22–32)
Calcium: 9.6 mg/dL (ref 8.9–10.3)
Chloride: 111 mmol/L (ref 98–111)
Creatinine, Ser: 0.88 mg/dL (ref 0.44–1.00)
GFR calc Af Amer: >60 mL/min (ref >60)
GFR calc non Af Amer: >60 mL/min (ref >60)
Glucose, Bld: 192 mg/dL — ABNORMAL HIGH (ref 70–99)
Phosphorus: 2 mg/dL — ABNORMAL LOW (ref 2.5–4.6)
Potassium: 4 mmol/L (ref 3.5–5.1)
Sodium: 139 mmol/L (ref 135–145)

## 2019-05-30 LAB — GLUCOSE, CAPILLARY
Glucose-Capillary: 144 mg/dL — ABNORMAL HIGH (ref 70–99)
Glucose-Capillary: 195 mg/dL — ABNORMAL HIGH (ref 70–99)
Glucose-Capillary: 130 mg/dL — ABNORMAL HIGH (ref 70–99)
Glucose-Capillary: 239 mg/dL — ABNORMAL HIGH (ref 70–99)

## 2019-05-30 LAB — AFP TUMOR MARKER: AFP, Serum, Tumor Marker: 1.6 ng/mL (ref 0.0–8.3)

## 2019-05-30 LAB — CBC WITH DIFFERENTIAL/PLATELET
Abs Immature Granulocytes: 0.03 10*3/uL (ref 0.00–0.07)
Basophils Absolute: 0.1 10*3/uL (ref 0.0–0.1)
Basophils Relative: 1 %
Eosinophils Absolute: 0.2 10*3/uL (ref 0.0–0.5)
Eosinophils Relative: 4 %
HCT: 38.8 % (ref 36.0–46.0)
Hemoglobin: 12.9 g/dL (ref 12.0–15.0)
Immature Granulocytes: 1 %
Lymphocytes Relative: 25 %
Lymphs Abs: 1 10*3/uL (ref 0.7–4.0)
MCH: 33 pg (ref 26.0–34.0)
MCHC: 33.2 g/dL (ref 30.0–36.0)
MCV: 99.2 fL (ref 80.0–100.0)
Monocytes Absolute: 0.6 10*3/uL (ref 0.1–1.0)
Monocytes Relative: 16 %
Neutro Abs: 2.1 10*3/uL (ref 1.7–7.7)
Neutrophils Relative %: 53 %
Platelets: 76 10*3/uL — ABNORMAL LOW (ref 150–400)
RBC: 3.91 MIL/uL (ref 3.87–5.11)
RDW: 15.9 % — ABNORMAL HIGH (ref 11.5–15.5)
WBC: 3.9 10*3/uL — ABNORMAL LOW (ref 4.0–10.5)
nRBC: 0 % (ref 0.0–0.2)

## 2019-05-30 LAB — MAGNESIUM: Magnesium: 2 mg/dL (ref 1.7–2.4)

## 2019-05-30 MED ORDER — K PHOS MONO-SOD PHOS DI & MONO 155-852-130 MG PO TABS
500.0000 mg | ORAL_TABLET | Freq: Three times a day (TID) | ORAL | Status: DC
  Administered 2019-05-30 – 2019-06-01 (×7): 500 mg via ORAL
  Filled 2019-05-30 (×12): qty 2

## 2019-05-30 MED ORDER — POTASSIUM PHOSPHATE MONOBASIC 500 MG PO TABS
500.0000 mg | ORAL_TABLET | Freq: Three times a day (TID) | ORAL | Status: DC
  Filled 2019-05-30: qty 1

## 2019-05-30 MED ORDER — POTASSIUM CHLORIDE CRYS ER 20 MEQ PO TBCR: 40.0000 meq | EXTENDED_RELEASE_TABLET | Freq: Once | ORAL | Status: DC

## 2019-05-30 MED ORDER — GADOBUTROL 1 MMOL/ML IV SOLN
7.0000 mL | Freq: Once | INTRAVENOUS | Status: AC | PRN
  Administered 2019-05-30: 7 mL via INTRAVENOUS

## 2019-05-30 MED ORDER — PRO-STAT SUGAR FREE PO LIQD
30.0000 mL | Freq: Three times a day (TID) | ORAL | Status: DC
  Administered 2019-05-30 – 2019-06-01 (×6): 30 mL via ORAL
  Filled 2019-05-30 (×6): qty 30

## 2019-05-30 NOTE — Progress Notes (Signed)
Calorie Count Note  48 hour calorie count ordered.  Diet: Carbohydrate modified diet with thin liquids Supplements:   Boost Breeze po TID, each supplement provides 250 kcal and 9 grams of protein.  30 ml Prostat po BID, each supplement provides 100 kcal and 15 grams of protein.   Breakfast: 288 kcal, 20 grams of protein Lunch: 419 kcal, 15 grams of protein Dinner: 372 kcal, 22 grams of protein Supplements: 200 kcal, 30 grams of protein  Day 2 Total intake: 1279 kcal (73% of kcal needs)  87 grams of protein (100% of protein needs)  Estimated Nutritional Needs:  Kcal:  1750-1900 Protein:  85-100 grams Fluid:  >/= 1.7 L/day  Pt reports she has been able to tolerate her meals well. RD to continue with nutritional supplements to aid in caloric and protein needs. RD to additionally increase Prostat to TID to aid in increased protein needs.   Nutrition Dx:  Increased nutrient needs related to chronic illness(cirrhosis) as evidenced by estimated needs; ongoing  Goal:  Pt to meet >/= 90% of their estimated nutrition needs; progressing  Intervention:  Discontinue calorie count  Continue Boost Breeze po TID, each supplement provides 250 kcal and 9 grams of protein.  Provide 30 ml Prostat po TID, each supplement provides 100 kcal and 15 grams of protein.   Encourage adequate PO intake.   Corrin Parker, MS, RD, LDN Pager # [phone] After hours/ weekend pager # [phone]

## 2019-05-30 NOTE — Progress Notes (Signed)
[name] Kitchen  PROGRESS NOTE    [name]  ACZ:[id number] DOB: [birth year] DOA: 05/26/2019 PCP: Gayland Curry, DO   Brief Narrative:   74 year old lady with prior h/o hypertension, DM, CAD, s/p CABG, non alcoholic cirrhosis admitted for hypercalcemia, dehydration, generalized weakness and fatigue. On arrival to ED, she was found to be hypercalcemicpossibly from multiple calcium supplements and vitamin D prior to admission and altered mental status. She was started on IV fluidsand calcium has improved. Gastroenterology consulted for nonalcoholic liver cirrhosis. Patient seen and examined today she is alert and responding to questions appropriately.  05/30/19: Appreciate calorie counts. They are improved. She reports improved eating. MRI Liver ordered. K+ ok. Continue phos replacement. She is looking better. Bactrim to end tomorrow. PT recs HHPT. Let's see what the MRI shows. If Bx needed, will d/w onco and IR.    Assessment & Plan:   Active Problems:   Encephalopathy, hepatic (HCC)   Hypercalcemia   S/P TIPS (transjugular intrahepatic portosystemic shunt)  Hypercalcemia     - Unclear etiology, possibly from dehydration     - stable     - PTH is 9, PTH related peptide pending     Acute metabolic encephalopathy      - likely secondary to hypercalcemia and possible hepatic issues     - interviewed appropriately this AM  Hyponatremia  Hypophosphatemia Hypokalemia     - Na+ resolved with fluids     - K+ resolved     - K-phos for Phos today  Enterobacter UTI     - end date bactrim tomorrow  Acute kidney injury     - resolved w/ fluids  DM2     - SSI, 3 units novolog WM  Chronic thrombocytopenia:     - Platelets have dropped from 133000 to 70,000's     - no signs of frank bleed     - likely d/t liver disease  Severe protein calorie malnutrition     - appreciate dietary assistance     - calorie counts; doing better  Liver cirrhosis     - s/p TIPS for bleeding  esophageal varices     - Patient is currently on rifaximin, continue the same and lactulose started by gastroenterology.     - IR evaluation for the size of TIPS patency/size: liver doppler has been ordered for further evaluation; TIPS shunt widely patent w/ normal velocities per radiology; rec obtaining AFP and MRI d/t right liver lobe mass seen on doppler     - AFP 1.6; MRI Liver pending  Severe debility and deconditioning     - Likely from CVA and prolonged hospitalization     - Continue PT inpatient hospital.      - rec HHPT   Unwitnessed fall on 04/29/2019     - Fell at bedside chair when attempted to transfer to commode.       - Denies hitting his head; No loss of consciousness; No new focal neurologic deficit at that time.     - Continue fall precautions  Liver mass     - AFP 1.6; MRI liver pending  DVT prophylaxis: SCD Code Status: FULL Family Communication: Spoke with son at bedside   Disposition Plan: Needs MRI liver completed; likely going home with HHPT  Antimicrobials:  . bactrim   ROS:  Denies N, V, CP, dyspnea, ab pain . Remainder 10-pt ROS is negative for all not previously mentioned.  Subjective: "I think I'm eating better."  Objective: Vitals:   05/30/19 0325 05/30/19 0723 05/30/19 1111 05/30/19 1615  BP: 120/60 (!) 110/46 112/65 (!) 117/50  Pulse: 79 79 80 81  Resp: 16 20 20 20   Temp: 98 F (36.7 C) 98.3 F (36.8 C) 97.8 F (36.6 C) 97.8 F (36.6 C)  TempSrc: Oral Oral Oral Oral  SpO2: 99% 98% 98% 97%    Intake/Output Summary (Last 24 hours) at 05/30/2019 1629 Last data filed at 05/29/2019 1700 Gross per 24 hour  Intake 2414.25 ml  Output --  Net 2414.25 ml   There were no vitals filed for this visit.  Examination:  General: 74 y.o. female resting in bed in NAD Cardiovascular: RRR, +S1, S2, no m/g/r Respiratory: CTABL, no w/r/r, normal WOB GI: BS+, NDNT, no masses noted, soft MSK: No e/c/c Neuro: alert to name, follows commands Psyc:  Appropriate interaction and affect, calm/cooperative   Data Reviewed: I have personally reviewed following labs and imaging studies.  CBC: Recent Labs  Lab 05/26/19 1332 05/27/19 0352 05/28/19 1222 05/30/19 0424  WBC 8.8 5.9 6.7 3.9*  NEUTROABS  --   --   --  2.1  HGB 15.0 13.5 13.1 12.9  HCT 45.3 41.4 39.3 38.8  MCV 98.9 99.8 98.5 99.2  PLT 133* 98* 79* 76*   Basic Metabolic Panel: Recent Labs  Lab 05/26/19 1332 05/26/19 1520 05/26/19 1647 05/27/19 0352 05/28/19 0257 05/29/19 0256 05/30/19 0424  NA 130*  --   --  135 136 135 139  K 4.2  --   --  4.2 3.6 3.1* 4.0  CL 92*  --   --  103 107 109 111  CO2 27  --   --  23 22 20* 20*  GLUCOSE 206*  --   --  184* 181* 175* 192*  BUN 22  --   --  22 15 13 13   CREATININE 1.51*  --   --  1.44* 1.17* 0.97 0.88  CALCIUM 13.4*  --  13.4* 11.3* 9.9 9.2 9.6  MG  --  2.7*  --   --  2.3  --  2.0  PHOS  --   --   --   --  1.6* 1.7* 2.0*   GFR: Estimated Creatinine Clearance: 52.7 mL/min (by C-G formula based on SCr of 0.88 mg/dL). Liver Function Tests: Recent Labs  Lab 05/26/19 1332 05/27/19 0352 05/28/19 0257 05/29/19 0256 05/30/19 0424  AST 60* 53*  --   --   --   ALT 45* 36  --   --   --   ALKPHOS 190* 148*  --   --   --   BILITOT 3.1* 2.2*  --   --   --   PROT 7.4 5.8*  --   --   --   ALBUMIN 2.8* 2.2* 2.0* 1.9* 2.1*   Recent Labs  Lab 05/26/19 1332  LIPASE 25   Recent Labs  Lab 05/26/19 1521  AMMONIA 41*   Coagulation Profile: No results for input(s): INR, PROTIME in the last 168 hours. Cardiac Enzymes: No results for input(s): CKTOTAL, CKMB, CKMBINDEX, TROPONINI in the last 168 hours. BNP (last 3 results) No results for input(s): PROBNP in the last 8760 hours. HbA1C: No results for input(s): HGBA1C in the last 72 hours. CBG: Recent Labs  Lab 05/29/19 1111 05/29/19 2131 05/30/19 0637 05/30/19 1109 05/30/19 1613  GLUCAP 200* 256* 144* 195* 130*   Lipid Profile: No results for input(s): CHOL,  HDL, LDLCALC, TRIG, CHOLHDL, LDLDIRECT in  the last 72 hours. Thyroid Function Tests: No results for input(s): TSH, T4TOTAL, FREET4, T3FREE, THYROIDAB in the last 72 hours. Anemia Panel: No results for input(s): VITAMINB12, FOLATE, FERRITIN, TIBC, IRON, RETICCTPCT in the last 72 hours. Sepsis Labs: No results for input(s): PROCALCITON, LATICACIDVEN in the last 168 hours.  Recent Results (from the past 240 hour(s))  Urine culture     Status: Abnormal   Collection Time: 05/26/19  3:07 PM   Specimen: Urine, Clean Catch  Result Value Ref Range Status   Specimen Description URINE, CLEAN CATCH  Final   Special Requests   Final    NONE Performed at [facility], [address] [place], [place] [address]    Culture >=100,000 COLONIES/mL ENTEROBACTER AEROGENES (A)  Final   Report Status 05/28/2019 FINAL  Final   Organism ID, Bacteria ENTEROBACTER AEROGENES (A)  Final      Susceptibility   Enterobacter aerogenes - MIC*    CEFAZOLIN >=64 RESISTANT Resistant     CEFTRIAXONE <=0.25 SENSITIVE Sensitive     CIPROFLOXACIN <=0.25 SENSITIVE Sensitive     GENTAMICIN <=1 SENSITIVE Sensitive     IMIPENEM 1 SENSITIVE Sensitive     NITROFURANTOIN 128 RESISTANT Resistant     TRIMETH/SULFA <=20 SENSITIVE Sensitive     PIP/TAZO <=4 SENSITIVE Sensitive     * >=100,000 COLONIES/mL ENTEROBACTER AEROGENES  Respiratory Panel by RT PCR (Flu A&B, Covid) - Nasopharyngeal Swab     Status: None   Collection Time: 05/26/19  4:30 PM   Specimen: Nasopharyngeal Swab  Result Value Ref Range Status   SARS Coronavirus 2 by RT PCR NEGATIVE NEGATIVE Final    Comment: (NOTE) SARS-CoV-2 target nucleic acids are NOT DETECTED. The SARS-CoV-2 RNA is generally detectable in upper respiratoy specimens during the acute phase of infection. The lowest concentration of SARS-CoV-2 viral copies this assay can detect is 131 copies/mL. A negative result does not preclude SARS-Cov-2 infection and should not be used as the sole  basis for treatment or other patient management decisions. A negative result may occur with  improper specimen collection/handling, submission of specimen other than nasopharyngeal swab, presence of viral mutation(s) within the areas targeted by this assay, and inadequate number of viral copies (<131 copies/mL). A negative result must be combined with clinical observations, patient history, and epidemiological information. The expected result is Negative. Fact Sheet for Patients:  PinkCheek.be Fact Sheet for Healthcare Providers:  GravelBags.it This test is not yet ap proved or cleared by the [place] FDA and  has been authorized for detection and/or diagnosis of SARS-CoV-2 by FDA under an Emergency Use Authorization (EUA). This EUA will remain  in effect (meaning this test can be used) for the duration of the COVID-19 declaration under Section 564(b)(1) of the Act, 21 U.S.C. section 360bbb-3(b)(1), unless the authorization is terminated or revoked sooner.    Influenza A by PCR NEGATIVE NEGATIVE Final   Influenza B by PCR NEGATIVE NEGATIVE Final    Comment: (NOTE) The Xpert Xpress SARS-CoV-2/FLU/RSV assay is intended as an aid in  the diagnosis of influenza from Nasopharyngeal swab specimens and  should not be used as a sole basis for treatment. Nasal washings and  aspirates are unacceptable for Xpert Xpress SARS-CoV-2/FLU/RSV  testing. Fact Sheet for Patients: PinkCheek.be Fact Sheet for Healthcare Providers: GravelBags.it This test is not yet approved or cleared by the [place] FDA and  has been authorized for detection and/or diagnosis of SARS-CoV-2 by  FDA under an Emergency Use Authorization (  EUA). This EUA will remain  in effect (meaning this test can be used) for the duration of the  Covid-19 declaration under Section 564(b)(1) of the Act, 21  U.S.C.  section 360bbb-3(b)(1), unless the authorization is  terminated or revoked. Performed at [facility], [place] [address] [place], [place] [address]       Radiology Studies: US LIVER DOPPLER  Result Date: 05/29/2019 CLINICAL DATA:  History of TIPS placement on 04/08/2019 to treat bleeding esophageal varices. History of cirrhosis and portal hypertension. Admission currently for treatment of hepatic encephalopathy. Assessment of TIPS patency and internal velocities. EXAM: DUPLEX ULTRASOUND OF LIVER AND TIPS SHUNT TECHNIQUE: Color and duplex Doppler ultrasound was performed to evaluate the hepatic in-flow and out-flow vessels. COMPARISON:  TIPS procedure on 04/08/2019 and CT of the abdomen on 04/06/2019. FINDINGS: Portal Vein Velocities Main:  79 cm/sec No evidence of portal vein thrombus. Hepatopetal flow towards the liver. TIPS Stent Velocities Proximal:  177 cm/sec Mid:  129 Distal:  201 cm/sec No visible stenosis or thrombus. IVC: Patent.  Resent and patent with normal respiratory phasicity. Hepatic Vein Velocities Right:  Not able to be sample due to overlying TIPS shunt. Mid:  45 cm/sec Left:  31 cm/sec Splenic Vein: 49 Superior Mesenteric Vein: Not visualized. Hepatic Artery: 149 Ascites: Small amount of ascites visualized adjacent to the liver. Varices: None visualized. Other findings: Within the liver, a focal heterogeneous rounded area is noted in the right lobe measuring roughly 3.6 x 3.8 x 2.8 cm. This is suspicious for a hepatic mass. No evidence of biliary ductal dilatation. The liver demonstrates morphology consistent with cirrhosis. IMPRESSION: 1. Widely patent TIPS shunt with normal velocities. 2. Mass-like area in the right lobe of the liver suspicious for malignancy measuring up to approximately 3.8 cm in diameter. Recommend correlation with serum AFP level and further imaging correlation with MRI of the abdomen with and without gadolinium. When the patient is clinically stable and able  to follow directions and hold their breath (preferably as an outpatient) further evaluation with dedicated abdominal MRI should be considered. Electronically Signed   By: Aletta Edouard M.D.   On: 05/29/2019 11:07     Scheduled Meds: . acetaminophen  500 mg Oral QHS  . acidophilus  1 capsule Oral Daily  . ezetimibe  10 mg Oral Daily  . feeding supplement  1 Container Oral TID BM  . feeding supplement (PRO-STAT SUGAR FREE 64)  30 mL Oral TID  . insulin aspart  0-9 Units Subcutaneous TID WC  . insulin aspart  3 Units Subcutaneous TID WC  . lactulose  30 g Oral QID  . pantoprazole  40 mg Oral Daily  . phosphorus  500 mg Oral TID WC & HS  . polyvinyl alcohol   Both Eyes Daily  . rifaximin  550 mg Oral BID  . sulfamethoxazole-trimethoprim  1 tablet Oral Q12H   Continuous Infusions: . sodium chloride 100 mL/hr at 05/29/19 1645  . sodium phosphate  Dextrose 5% IVPB 42 mL/hr at 05/29/19 1840     LOS: 4 days    Time spent: 25 minutes spent in the coordination of care today.    Jonnie Finner, DO [facility]  If 7PM-7AM, please contact night-coverage [email or web address] 05/30/2019, 4:29 PM

## 2019-05-31 LAB — RENAL FUNCTION PANEL
Albumin: 2 g/dL — ABNORMAL LOW (ref 3.5–5.0)
Anion gap: 8 (ref 5–15)
BUN: 15 mg/dL (ref 8–23)
CO2: 19 mmol/L — ABNORMAL LOW (ref 22–32)
Calcium: 9.1 mg/dL (ref 8.9–10.3)
Chloride: 110 mmol/L (ref 98–111)
Creatinine, Ser: 0.93 mg/dL (ref 0.44–1.00)
GFR calc Af Amer: >60 mL/min (ref >60)
GFR calc non Af Amer: >60 mL/min (ref >60)
Glucose, Bld: 239 mg/dL — ABNORMAL HIGH (ref 70–99)
Phosphorus: 3.3 mg/dL (ref 2.5–4.6)
Potassium: 3.6 mmol/L (ref 3.5–5.1)
Sodium: 137 mmol/L (ref 135–145)

## 2019-05-31 LAB — CBC WITH DIFFERENTIAL/PLATELET
Abs Immature Granulocytes: 0.01 10*3/uL (ref 0.00–0.07)
Basophils Absolute: 0.1 10*3/uL (ref 0.0–0.1)
Basophils Relative: 1 %
Eosinophils Absolute: 0.2 10*3/uL (ref 0.0–0.5)
Eosinophils Relative: 5 %
HCT: 36.3 % (ref 36.0–46.0)
Hemoglobin: 12.4 g/dL (ref 12.0–15.0)
Immature Granulocytes: 0 %
Lymphocytes Relative: 26 %
Lymphs Abs: 0.9 10*3/uL (ref 0.7–4.0)
MCH: 33.8 pg (ref 26.0–34.0)
MCHC: 34.2 g/dL (ref 30.0–36.0)
MCV: 98.9 fL (ref 80.0–100.0)
Monocytes Absolute: 0.6 10*3/uL (ref 0.1–1.0)
Monocytes Relative: 16 %
Neutro Abs: 1.8 10*3/uL (ref 1.7–7.7)
Neutrophils Relative %: 52 %
Platelets: 77 10*3/uL — ABNORMAL LOW (ref 150–400)
RBC: 3.67 MIL/uL — ABNORMAL LOW (ref 3.87–5.11)
RDW: 15.9 % — ABNORMAL HIGH (ref 11.5–15.5)
WBC: 3.5 10*3/uL — ABNORMAL LOW (ref 4.0–10.5)
nRBC: 0 % (ref 0.0–0.2)

## 2019-05-31 LAB — GLUCOSE, CAPILLARY
Glucose-Capillary: 218 mg/dL — ABNORMAL HIGH (ref 70–99)
Glucose-Capillary: 262 mg/dL — ABNORMAL HIGH (ref 70–99)
Glucose-Capillary: 239 mg/dL — ABNORMAL HIGH (ref 70–99)
Glucose-Capillary: 232 mg/dL — ABNORMAL HIGH (ref 70–99)

## 2019-05-31 LAB — MAGNESIUM: Magnesium: 1.9 mg/dL (ref 1.7–2.4)

## 2019-05-31 MED ORDER — INSULIN GLARGINE 100 UNIT/ML ~~LOC~~ SOLN
5.0000 [IU] | Freq: Every day | SUBCUTANEOUS | Status: DC
  Administered 2019-05-31: 22:00:00 5 [IU] via SUBCUTANEOUS
  Filled 2019-05-31 (×2): qty 0.05

## 2019-05-31 NOTE — Progress Notes (Signed)
Physical Therapy Treatment Patient Details Name: [name] MRN: [id number] DOB: 1945/12/08 Today's Date: 05/31/2019    History of Present Illness Patient is a 74 year old Caucasian female with past medical history significant for hypertension, diabetes mellitus, coronary artery disease status post CABG and nonalcoholic cirrhosis.  Patient was admitted with hypercalcemia, volume depletion, weakness and fatigue.    PT Comments    Pt performed gt training and functional mobility this session with increase time allocated to transfer and dynamic standing balance at sink.  She continues to require min assistance and HHPT remains appropriate.     Follow Up Recommendations  Home health PT     Equipment Recommendations  None recommended by PT    Recommendations for Other Services       Precautions / Restrictions Precautions Precautions: Fall Restrictions Weight Bearing Restrictions: No    Mobility  Bed Mobility Overal bed mobility: Needs Assistance Bed Mobility: Supine to Sit     Supine to sit: Min guard;HOB elevated     General bed mobility comments: Pt remains slow to move to seated position edge of bed.  Transfers Overall transfer level: Needs assistance Equipment used: Rolling walker (2 wheeled) Transfers: Sit to/from Stand Sit to Stand: Min assist         General transfer comment: Min assistance with cues for hand placement.  Performed from multiple surfaces.  Ambulation/Gait Ambulation/Gait assistance: Min assist Gait Distance (Feet): 80 Feet Assistive device: Rolling walker (2 wheeled) Gait Pattern/deviations: Step-through pattern;Decreased stride length;Trunk flexed;Shuffle     General Gait Details: Pt with B shuffling L>R.  He required assistance to turn and back to seated surface.  Pt continues to demonstrate with poor obstacle negotiation.   Stairs             Wheelchair Mobility    Modified Rankin (Stroke Patients Only)        Balance Overall balance assessment: Needs assistance   Sitting balance-Leahy Scale: Fair       Standing balance-Leahy Scale: Fair                              Cognition Arousal/Alertness: Awake/alert Behavior During Therapy: Flat affect Overall Cognitive Status: Impaired/Different from baseline Area of Impairment: Problem solving;Following commands;Memory;Safety/judgement                 Orientation Level: Situation   Memory: Decreased short-term memory Following Commands: Follows one step commands consistently;Follows multi-step commands with increased time Safety/Judgement: Decreased awareness of deficits   Problem Solving: Slow processing        Exercises      General Comments        Pertinent Vitals/Pain Pain Assessment: Faces Faces Pain Scale: No hurt    Home Living                      Prior Function            PT Goals (current goals can now be found in the care plan section) Acute Rehab PT Goals Patient Stated Goal: return home Potential to Achieve Goals: Good Progress towards PT goals: Progressing toward goals    Frequency    Min 3X/week      PT Plan Current plan remains appropriate    Co-evaluation              AM-PAC PT "6 Clicks" Mobility   Outcome Measure  Help needed turning from your back  to your side while in a flat bed without using bedrails?: A Little Help needed moving from lying on your back to sitting on the side of a flat bed without using bedrails?: A Little Help needed moving to and from a bed to a chair (including a wheelchair)?: A Little Help needed standing up from a chair using your arms (e.g., wheelchair or bedside chair)?: A Little Help needed to walk in hospital room?: A Little Help needed climbing 3-5 steps with a railing? : A Lot 6 Click Score: 17    End of Session Equipment Utilized During Treatment: Gait belt Activity Tolerance: Patient tolerated treatment well Patient  left: in chair;with call bell/phone within reach;with family/visitor present Nurse Communication: Mobility status PT Visit Diagnosis: Difficulty in walking, not elsewhere classified (R26.2);Muscle weakness (generalized) (M62.81)     Time: 7711-6579 PT Time Calculation (min) (ACUTE ONLY): 40 min  Charges:  $Gait Training: 8-22 mins $Therapeutic Activity: 23-37 mins                     Erasmo Leventhal , PTA Acute Rehabilitation Services Pager [phone] Office [phone]     Danielle Lento [name] 05/31/2019, 6:12 PM

## 2019-05-31 NOTE — Progress Notes (Signed)
[name] Kitchen  PROGRESS NOTE    [name]  GOT:[id number] DOB: April 13, 1946 DOA: 05/26/2019 PCP: Gayland Curry, DO   Brief Narrative:   74 year old lady with prior h/o hypertension, DM, CAD, s/p CABG, non alcoholic cirrhosis admitted for hypercalcemia, dehydration, generalized weakness and fatigue. On arrival to ED, she was found to be hypercalcemicpossibly from multiple calcium supplements and vitamin D prior to admission and altered mental status. She was started on IV fluidsand calcium has improved. Gastroenterology consulted for nonalcoholic liver cirrhosis. Patient seen and examined today she is alert and responding to questions appropriately.  05/31/19: Calorie counts are good. Spoke with Dr. Burr Medico about her MRI results. She will follow up in clinic with her in 1 - 2 weeks. Spoke with her son about this plan and he is in agreement. She looks a little worn out today and could probably benefit from another night before discharging to home with HHPT. Plan is that this occurs in the AM.    Assessment & Plan:   Active Problems:   Encephalopathy, hepatic (HCC)   Hypercalcemia   S/P TIPS (transjugular intrahepatic portosystemic shunt)  Hypercalcemia - Unclear etiology, possibly from dehydration - stable - PTH is 9, PTH related peptide pending  Acute metabolic encephalopathy  - likely secondary to hypercalcemia and possible hepatic issues - interviewed appropriately this AM  Hyponatremia  Hypophosphatemia Hypokalemia - Na+ resolved with fluids - K+ resolved     - K-phos for Phos today  Enterobacter UTI - end date bactrim today  Acute kidney injury - resolved w/ fluids  DM2 - SSI, 3 units novolog WM  Chronic thrombocytopenia: - Platelets have dropped from 133000 to 70,000's - no signs of frank bleed - likely d/t liver disease  Severe protein calorie malnutrition - appreciate dietary assistance -  calorie counts; doing better  Liver cirrhosis - s/p TIPS for bleeding esophageal varices - Patient is currently on rifaximin, continue the same and lactulose started by gastroenterology. - IR evaluation for the size of TIPS patency/size: liver doppler has been ordered for further evaluation; TIPS shunt widely patent w/ normal velocities per radiology; rec obtaining AFP and MRI d/t right liver lobe mass seen on doppler     - AFP 1.6; MRI Liver see below  Severe debility and deconditioning - Likely from CVA and prolonged hospitalization - Continue PT inpatient hospital.      - rec HHPT  Unwitnessed fall on 04/29/2019 - Fell at bedside chair when attempted to transfer to commode.  - Denies hitting his head; No loss of consciousness; No new focal neurologic deficit at that time. - Continue fall precautions  Liver mass - AFP 1.6; MRI liver pending     - MRI liver: 2. Cirrhosis. Right liver lobe 3.0 cm mass with mixed signal intensity, restricted diffusion and no compelling evidence of enhancement on this motion degraded scan, located just lateral to the midportion of the TIPS, new since most recent comparison study (04/06/2019 CT). This mass remains indeterminate for neoplasm on the basis of this limited MRI study, however findings favor a biloma. Infected biloma cannot be excluded. Percutaneous biopsy could be considered. Short-term outpatient follow-up MRI abdomen without and with IV contrast recommended in 3 months     - reviewed with medical onco; will arrange for quick follow up  DVT prophylaxis: SCDs Code Status: FULL Family Communication: Spoke with husband at bedside and son on phone   Disposition Plan: Home tomorrow with [facility]  Antimicrobials:  . bactrim  ROS:  Denies CP, dyspnea, ab pain, N. Remainder 10-pt ROS is negative for all not previously mentioned.  Subjective: "I'm feeling worn out."  Objective: Vitals:   05/30/19  2330 05/31/19 0349 05/31/19 0800 05/31/19 1204  BP: (!) 102/47 (!) 113/50 (!) 113/53 (!) 128/53  Pulse: 80 79 80 80  Resp: 17 16 20 17   Temp: 98.3 F (36.8 C) 97.6 F (36.4 C) 98.1 F (36.7 C) 98.5 F (36.9 C)  TempSrc: Oral Oral Oral Oral  SpO2: 97% 96% 95% 99%    Intake/Output Summary (Last 24 hours) at 05/31/2019 1450 Last data filed at 05/31/2019 1028 Gross per 24 hour  Intake --  Output 400 ml  Net -400 ml   There were no vitals filed for this visit.  Examination:  General: 74 y.o. female resting in bed in NAD Cardiovascular: RRR, +S1, S2, no m/g/r, equal pulses throughout Respiratory: CTABL, no w/r/r, normal WOB GI: BS+, NDNT, no masses noted, no organomegaly noted MSK: No e/c/c Neuro: alert to name, follows commands Psyc: calm/cooperative   Data Reviewed: I have personally reviewed following labs and imaging studies.  CBC: Recent Labs  Lab 05/26/19 1332 05/27/19 0352 05/28/19 1222 05/30/19 0424 05/31/19 0500  WBC 8.8 5.9 6.7 3.9* 3.5*  NEUTROABS  --   --   --  2.1 1.8  HGB 15.0 13.5 13.1 12.9 12.4  HCT 45.3 41.4 39.3 38.8 36.3  MCV 98.9 99.8 98.5 99.2 98.9  PLT 133* 98* 79* 76* 77*   Basic Metabolic Panel: Recent Labs  Lab 05/26/19 1520 05/26/19 1647 05/27/19 0352 05/28/19 0257 05/29/19 0256 05/30/19 0424 05/31/19 0500  NA  --    < > 135 136 135 139 137  K  --    < > 4.2 3.6 3.1* 4.0 3.6  CL  --    < > 103 107 109 111 110  CO2  --    < > 23 22 20* 20* 19*  GLUCOSE  --    < > 184* 181* 175* 192* 239*  BUN  --    < > 22 15 13 13 15   CREATININE  --    < > 1.44* 1.17* 0.97 0.88 0.93  CALCIUM  --    < > 11.3* 9.9 9.2 9.6 9.1  MG 2.7*  --   --  2.3  --  2.0 1.9  PHOS  --   --   --  1.6* 1.7* 2.0* 3.3   < > = values in this interval not displayed.   GFR: CrCl cannot be calculated (Unknown ideal weight.). Liver Function Tests: Recent Labs  Lab 05/26/19 1332 05/26/19 1332 05/27/19 0352 05/28/19 0257 05/29/19 0256 05/30/19 0424  05/31/19 0500  AST 60*  --  53*  --   --   --   --   ALT 45*  --  36  --   --   --   --   ALKPHOS 190*  --  148*  --   --   --   --   BILITOT 3.1*  --  2.2*  --   --   --   --   PROT 7.4  --  5.8*  --   --   --   --   ALBUMIN 2.8*   < > 2.2* 2.0* 1.9* 2.1* 2.0*   < > = values in this interval not displayed.   Recent Labs  Lab 05/26/19 1332  LIPASE 25   Recent Labs  Lab 05/26/19  1521  AMMONIA 41*   Coagulation Profile: No results for input(s): INR, PROTIME in the last 168 hours. Cardiac Enzymes: No results for input(s): CKTOTAL, CKMB, CKMBINDEX, TROPONINI in the last 168 hours. BNP (last 3 results) No results for input(s): PROBNP in the last 8760 hours. HbA1C: No results for input(s): HGBA1C in the last 72 hours. CBG: Recent Labs  Lab 05/30/19 1109 05/30/19 1613 05/30/19 2121 05/31/19 0624 05/31/19 1206  GLUCAP 195* 130* 239* 218* 262*   Lipid Profile: No results for input(s): CHOL, HDL, LDLCALC, TRIG, CHOLHDL, LDLDIRECT in the last 72 hours. Thyroid Function Tests: No results for input(s): TSH, T4TOTAL, FREET4, T3FREE, THYROIDAB in the last 72 hours. Anemia Panel: No results for input(s): VITAMINB12, FOLATE, FERRITIN, TIBC, IRON, RETICCTPCT in the last 72 hours. Sepsis Labs: No results for input(s): PROCALCITON, LATICACIDVEN in the last 168 hours.  Recent Results (from the past 240 hour(s))  Urine culture     Status: Abnormal   Collection Time: 05/26/19  3:07 PM   Specimen: Urine, Clean Catch  Result Value Ref Range Status   Specimen Description URINE, CLEAN CATCH  Final   Special Requests   Final    NONE Performed at [facility], [address], [place], [place] [address]    Culture >=100,000 COLONIES/mL ENTEROBACTER AEROGENES (A)  Final   Report Status 05/28/2019 FINAL  Final   Organism ID, Bacteria ENTEROBACTER AEROGENES (A)  Final      Susceptibility   Enterobacter aerogenes - MIC*    CEFAZOLIN >=64 RESISTANT Resistant     CEFTRIAXONE <=0.25  SENSITIVE Sensitive     CIPROFLOXACIN <=0.25 SENSITIVE Sensitive     GENTAMICIN <=1 SENSITIVE Sensitive     IMIPENEM 1 SENSITIVE Sensitive     NITROFURANTOIN 128 RESISTANT Resistant     TRIMETH/SULFA <=20 SENSITIVE Sensitive     PIP/TAZO <=4 SENSITIVE Sensitive     * >=100,000 COLONIES/mL ENTEROBACTER AEROGENES  Respiratory Panel by RT PCR (Flu A&B, Covid) - Nasopharyngeal Swab     Status: None   Collection Time: 05/26/19  4:30 PM   Specimen: Nasopharyngeal Swab  Result Value Ref Range Status   SARS Coronavirus 2 by RT PCR NEGATIVE NEGATIVE Final    Comment: (NOTE) SARS-CoV-2 target nucleic acids are NOT DETECTED. The SARS-CoV-2 RNA is generally detectable in upper respiratoy specimens during the acute phase of infection. The lowest concentration of SARS-CoV-2 viral copies this assay can detect is 131 copies/mL. A negative result does not preclude SARS-Cov-2 infection and should not be used as the sole basis for treatment or other patient management decisions. A negative result may occur with  improper specimen collection/handling, submission of specimen other than nasopharyngeal swab, presence of viral mutation(s) within the areas targeted by this assay, and inadequate number of viral copies (<131 copies/mL). A negative result must be combined with clinical observations, patient history, and epidemiological information. The expected result is Negative. Fact Sheet for Patients:  PinkCheek.be Fact Sheet for Healthcare Providers:  GravelBags.it This test is not yet ap proved or cleared by the [place] FDA and  has been authorized for detection and/or diagnosis of SARS-CoV-2 by FDA under an Emergency Use Authorization (EUA). This EUA will remain  in effect (meaning this test can be used) for the duration of the COVID-19 declaration under Section 564(b)(1) of the Act, 21 U.S.C. section 360bbb-3(b)(1), unless the  authorization is terminated or revoked sooner.    Influenza A by PCR NEGATIVE NEGATIVE Final   Influenza B by  PCR NEGATIVE NEGATIVE Final    Comment: (NOTE) The Xpert Xpress SARS-CoV-2/FLU/RSV assay is intended as an aid in  the diagnosis of influenza from Nasopharyngeal swab specimens and  should not be used as a sole basis for treatment. Nasal washings and  aspirates are unacceptable for Xpert Xpress SARS-CoV-2/FLU/RSV  testing. Fact Sheet for Patients: PinkCheek.be Fact Sheet for Healthcare Providers: GravelBags.it This test is not yet approved or cleared by the [place] FDA and  has been authorized for detection and/or diagnosis of SARS-CoV-2 by  FDA under an Emergency Use Authorization (EUA). This EUA will remain  in effect (meaning this test can be used) for the duration of the  Covid-19 declaration under Section 564(b)(1) of the Act, 21  U.S.C. section 360bbb-3(b)(1), unless the authorization is  terminated or revoked. Performed at [facility], [place] [address], [place], [place] [address]       Radiology Studies: MR LIVER W WO CONTRAST  Result Date: 05/30/2019 CLINICAL DATA:  Inpatient. Cirrhosis, admitted with a patent encephalopathy. History of TIPS placed 04/08/2019. Right liver mass on recent ultrasound. EXAM: MRI ABDOMEN WITHOUT AND WITH CONTRAST TECHNIQUE: Multiplanar multisequence MR imaging of the abdomen was performed both before and after the administration of intravenous contrast. CONTRAST:  65m GADAVIST GADOBUTROL 1 MMOL/ML IV SOLN COMPARISON:  05/29/2019 liver Doppler scan. 03/29/2019 CT abdomen/pelvis. 04/07/2017 MRI abdomen. FINDINGS: Scan significantly limited by motion degradation, particularly on the postcontrast sequences. Lower chest: Trace dependent bilateral pleural effusions. Hepatobiliary: Diffusely irregular liver surface compatible with cirrhosis. No hepatic steatosis. There is a 3.0 x  2.9 cm right liver lobe masslike focus (series 5/image 8) with heterogeneous mild T2 hyperintensity, restricted diffusion, mixed T1 signal with peripheral T1 hyperintensity and no compelling evidence of enhancement on the substantially motion degraded postcontrast sequences, located just lateral to the midportion of the TIPS and new since 04/06/2019 CT. No additional liver lesions. Numerous subcentimeter gallstones layering in the mildly distended gallbladder. Mild diffuse gallbladder wall thickening. No biliary ductal dilatation. Common bile duct diameter 6 mm. No appreciable choledocholithiasis, biliary strictures, biliary masses or beading on the motion degraded sequences. Pancreas: No pancreatic mass or duct dilation. Spleen: Normal size. No mass. Adrenals/Urinary Tract: Normal adrenals. No hydronephrosis. Normal kidneys with no renal mass. Stomach/Bowel: Normal non-distended stomach. Visualized small and large bowel is normal caliber, with no definite bowel wall thickening. Vascular/Lymphatic: Normal caliber abdominal aorta. Patent splenic and renal veins. Well-positioned TIPS extending proximally from the main portal vein to the junction of the right hepatic vein and IVC, which appears patent on the limited motion degraded postcontrast sequences. Moderate paraumbilical and gastrosplenic varices. Small lower esophageal varices. No pathologically enlarged lymph nodes in the abdomen. Other: Small volume ascites. Musculoskeletal: No aggressive appearing focal osseous lesions. Bilateral posterior spinal fusion hardware in the lower lumbar spine. IMPRESSION: 1. Limited motion degraded scan. 2. Cirrhosis. Right liver lobe 3.0 cm mass with mixed signal intensity, restricted diffusion and no compelling evidence of enhancement on this motion degraded scan, located just lateral to the midportion of the TIPS, new since most recent comparison study (04/06/2019 CT). This mass remains indeterminate for neoplasm on the basis  of this limited MRI study, however findings favor a biloma. Infected biloma cannot be excluded. Percutaneous biopsy could be considered. Short-term outpatient follow-up MRI abdomen without and with IV contrast recommended in 3 months. 3. TIPS appears well positioned and patent. 4. Small volume ascites. 5. Small lower esophageal, moderate paraumbilical and moderate gastrosplenic varices. 6. Trace dependent  bilateral pleural effusions. Electronically Signed   By: Ilona Sorrel M.D.   On: 05/30/2019 20:15     Scheduled Meds: . acetaminophen  500 mg Oral QHS  . acidophilus  1 capsule Oral Daily  . ezetimibe  10 mg Oral Daily  . feeding supplement  1 Container Oral TID BM  . feeding supplement (PRO-STAT SUGAR FREE 64)  30 mL Oral TID  . insulin aspart  0-9 Units Subcutaneous TID WC  . insulin aspart  3 Units Subcutaneous TID WC  . lactulose  30 g Oral QID  . pantoprazole  40 mg Oral Daily  . phosphorus  500 mg Oral TID WC & HS  . polyvinyl alcohol   Both Eyes Daily  . rifaximin  550 mg Oral BID  . sulfamethoxazole-trimethoprim  1 tablet Oral Q12H   Continuous Infusions: . sodium chloride 100 mL/hr at 05/31/19 1019  . sodium phosphate  Dextrose 5% IVPB 42 mL/hr at 05/29/19 1840     LOS: 5 days    Time spent: 25 minutes spent in the coordination of care today.   Jonnie Finner, DO [facility]  If 7PM-7AM, please contact night-coverage [email or web address] 05/31/2019, 2:50 PM

## 2019-06-01 DIAGNOSIS — K7030 Alcoholic cirrhosis of liver without ascites: Secondary | ICD-10-CM

## 2019-06-01 LAB — GLUCOSE, CAPILLARY
Glucose-Capillary: 203 mg/dL — ABNORMAL HIGH (ref 70–99)
Glucose-Capillary: 210 mg/dL — ABNORMAL HIGH (ref 70–99)
Glucose-Capillary: 210 mg/dL — ABNORMAL HIGH (ref 70–99)

## 2019-06-01 LAB — CBC WITH DIFFERENTIAL/PLATELET
Abs Immature Granulocytes: 0.01 10*3/uL (ref 0.00–0.07)
Basophils Absolute: 0 10*3/uL (ref 0.0–0.1)
Basophils Relative: 1 %
Eosinophils Absolute: 0.2 10*3/uL (ref 0.0–0.5)
Eosinophils Relative: 5 %
HCT: 35.4 % — ABNORMAL LOW (ref 36.0–46.0)
Hemoglobin: 12.2 g/dL (ref 12.0–15.0)
Immature Granulocytes: 0 %
Lymphocytes Relative: 24 %
Lymphs Abs: 0.9 10*3/uL (ref 0.7–4.0)
MCH: 33.5 pg (ref 26.0–34.0)
MCHC: 34.5 g/dL (ref 30.0–36.0)
MCV: 97.3 fL (ref 80.0–100.0)
Monocytes Absolute: 0.6 10*3/uL (ref 0.1–1.0)
Monocytes Relative: 15 %
Neutro Abs: 2.2 10*3/uL (ref 1.7–7.7)
Neutrophils Relative %: 55 %
Platelets: 82 10*3/uL — ABNORMAL LOW (ref 150–400)
RBC: 3.64 MIL/uL — ABNORMAL LOW (ref 3.87–5.11)
RDW: 16 % — ABNORMAL HIGH (ref 11.5–15.5)
WBC: 3.9 10*3/uL — ABNORMAL LOW (ref 4.0–10.5)
nRBC: 0 % (ref 0.0–0.2)

## 2019-06-01 LAB — RENAL FUNCTION PANEL
Albumin: 1.9 g/dL — ABNORMAL LOW (ref 3.5–5.0)
Anion gap: 6 (ref 5–15)
BUN: 12 mg/dL (ref 8–23)
CO2: 18 mmol/L — ABNORMAL LOW (ref 22–32)
Calcium: 9.1 mg/dL (ref 8.9–10.3)
Chloride: 116 mmol/L — ABNORMAL HIGH (ref 98–111)
Creatinine, Ser: 0.82 mg/dL (ref 0.44–1.00)
GFR calc Af Amer: >60 mL/min (ref >60)
GFR calc non Af Amer: >60 mL/min (ref >60)
Glucose, Bld: 239 mg/dL — ABNORMAL HIGH (ref 70–99)
Phosphorus: 3.5 mg/dL (ref 2.5–4.6)
Potassium: 3.6 mmol/L (ref 3.5–5.1)
Sodium: 140 mmol/L (ref 135–145)

## 2019-06-01 LAB — MAGNESIUM: Magnesium: 1.6 mg/dL — ABNORMAL LOW (ref 1.7–2.4)

## 2019-06-01 MED ORDER — PRO-STAT SUGAR FREE PO LIQD: 30.0000 mL | Freq: Three times a day (TID) | ORAL | 0 refills | Status: DC

## 2019-06-01 MED ORDER — NOVOLOG FLEXPEN 100 UNIT/ML ~~LOC~~ SOPN: PEN_INJECTOR | SUBCUTANEOUS | 3 refills | Status: DC

## 2019-06-01 MED ORDER — INSULIN DETEMIR 100 UNIT/ML ~~LOC~~ SOLN: 10.0000 [IU] | Freq: Every day | SUBCUTANEOUS | 11 refills | Status: DC

## 2019-06-01 MED ORDER — MAGNESIUM OXIDE 400 (241.3 MG) MG PO TABS
400.0000 mg | ORAL_TABLET | Freq: Two times a day (BID) | ORAL | Status: DC
  Administered 2019-06-01: 400 mg via ORAL
  Filled 2019-06-01: qty 1

## 2019-06-01 NOTE — Discharge Summary (Signed)
. Physician Discharge Summary  [name] OVF:[id number] DOB: [birth year] DOA: 05/26/2019  PCP: Gayland Curry, DO  Admit date: 05/26/2019 Discharge date: 06/01/2019  Admitted From: Home Disposition:  Discharged to home with [facility]  Recommendations for Outpatient Follow-up:  1. Follow up with PCP in 1-2 weeks 2. Please obtain BMP/CBC in one week  Discharge Condition: Stable  CODE STATUS: FULL   Brief/Interim Summary: 74 year old lady with prior h/o hypertension, DM, CAD, s/p CABG, non alcoholic cirrhosis admitted for hypercalcemia, dehydration, generalized weakness and fatigue. On arrival to ED, she was found to be hypercalcemicpossibly from multiple calcium supplements and vitamin D prior to admission and altered mental status. She was started on IV fluidsand calcium has improved. Gastroenterology consulted for nonalcoholic liver cirrhosis. Patient seen and examined today she is alert and responding to questions appropriately.  06/01/19: Looks better this morning. Talked about nutrition supplementation and getting her stronger through nutrition and PT. Will send home with HHPT. Will have follow up with Dr. Burr Medico. Needs to follow up with PCP and GI as well. Her BP remains soft. Holding diuretics for now. Readdress with GI/PCP. She looks good overall. Will d/c to home.   Discharge Diagnoses:  Active Problems:   Encephalopathy, hepatic (HCC)   Hypercalcemia   S/P TIPS (transjugular intrahepatic portosystemic shunt)  Hypercalcemia - Unclear etiology, possibly from dehydration -stable - PTH is 9, PTH related peptide active in process     - Ca2+ stable at 84.1  Acute metabolic encephalopathy  - likely secondary to hypercalcemia and possible hepatic issues - interviewed appropriately this AM     - 06/01/19: she is mentally appropriate today  Hyponatremia  Hypophosphatemia Hypokalemia Hypomagnesemia - Na+/K+/Phos resolved     - Continue home Mg2+  at discharge  Enterobacter UTI -completed bactrim regimen; improved  Acute kidney injury - resolved w/ fluids  DM2 - send home on her home regimen with modifications: change levemir to 10 units qHS for now and novolog 5 units WM; titrate as needed per PCP   Chronic thrombocytopenia: - Platelets have dropped from 133000 to 70,000's - no signs of frank bleed - likely d/t liver disease     - 06/01/19: 82k this AM; no evidence of bleed  Severe protein calorie malnutrition - appreciate dietary assistance - calorie counts; doing better  Liver cirrhosis - s/p TIPS for bleeding esophageal varices - Patient is currently on rifaximin, continue the same and lactulose started by gastroenterology. - IR evaluation for the size of TIPS patency/size: liver doppler has been ordered for further evaluation; TIPS shunt widely patent w/ normal velocities per radiology; rec obtaining AFP and MRI d/t right liver lobe mass seen on doppler - AFP 1.6; MRI Liver see below     - holding aldactone, lasix d/t soft pressures; follow up with GI  Severe debility and deconditioning - Likely from CVA and prolonged hospitalization - Continue PT inpatient hospital. - rec HHPT; TOC has arranged     - continue boost supplement and nutrition supplementation at discharge  Unwitnessed fall on 04/29/2019 - Fell at bedside chair when attempted to transfer to commode.  - Denies hitting his head; No loss of consciousness; No new focal neurologic deficit at that time. - Continue fall precautions  Liver mass -AFP 1.6; MRI liver pending     - MRI liver: 2. Cirrhosis. Right liver lobe 3.0 cm mass with mixed signal intensity, restricted diffusion and no compelling evidence of enhancement on this motion degraded scan, located just  lateral to the midportion of the TIPS, new since most recent comparison study (04/06/2019 CT). This mass  remains indeterminate for neoplasm on the basis of this limited MRI study, however findings favor a biloma. Infected biloma cannot be excluded. Percutaneous biopsy could be considered. Short-term outpatient follow-up MRI abdomen without and with IV contrast recommended in 3 months     - reviewed with medical onco; will arrange for quick follow up  Discharge Instructions   Allergies as of 06/01/2019      Reactions   Kiwi Extract Anaphylaxis   Tdap [tetanus-diphth-acell Pertussis] Swelling, Other (See Comments)   Swelling at injection site, gets very hot   Statins Other (See Comments)   RHABDOMYOLYSIS   Latex Itching, Dermatitis, Rash   Tramadol Nausea And Vomiting      Medication List    STOP taking these medications   calcium carbonate 600 MG Tabs tablet Commonly known as: OS-CAL   Lasix 40 MG tablet Generic drug: furosemide   spironolactone 50 MG tablet Commonly known as: ALDACTONE   Vitamin D 50 MCG (2000 UT) Caps     TAKE these medications   acetaminophen 500 MG tablet Commonly known as: TYLENOL Take 500 mg by mouth at bedtime.   BD Pen Needle Nano U/F 32G X 4 MM Misc Generic drug: Insulin Pen Needle USE THREE TIMES DAILY AS DIRECTED   Biotin 10000 MCG Tabs Take 10,000 mcg by mouth every morning.   bisacodyl 10 MG suppository Commonly known as: DULCOLAX Place 1 suppository (10 mg total) rectally daily as needed for moderate constipation.   ezetimibe 10 MG tablet Commonly known as: ZETIA TAKE 1 TABLET(10 MG) BY MOUTH DAILY   feeding supplement (PRO-STAT SUGAR FREE 64) Liqd Take 30 mLs by mouth 3 (three) times daily with meals.   glucose blood test strip One Touch Ultra II strips. Use to test blood sugar three times daily. Dx: E11.65   insulin detemir 100 UNIT/ML injection Commonly known as: Levemir Inject 0.1 mLs (10 Units total) into the skin at bedtime. What changed: how much to take   INSULIN SYRINGE 1CC/31GX5/16" 31G X 5/16" 1 ML Misc USE AS  DIRECTED DAILY WITH LEVEMIR   Jardiance 25 MG Tabs tablet Generic drug: empagliflozin Take 25 mg by mouth daily.   lactulose 10 GM/15ML solution Commonly known as: CHRONULAC Take 45 mLs (30 g total) by mouth 4 (four) times daily.   loratadine 10 MG tablet Commonly known as: CLARITIN Take 10 mg by mouth daily as needed for allergies.   MAGNESIUM PO Take 500 mg by mouth daily.   multivitamin with minerals tablet Take 1 tablet by mouth daily.   NovoLOG FlexPen 100 UNIT/ML FlexPen Generic drug: insulin aspart Inject 5 units with breakfast, lunch, and dinner. What changed: additional instructions   ondansetron 4 MG tablet Commonly known as: ZOFRAN Take 1 tablet (4 mg total) by mouth every 6 (six) hours as needed for nausea.   pantoprazole 40 MG tablet Commonly known as: Protonix Take 1 tablet (40 mg total) by mouth daily.   PROBIOTIC DAILY PO Take 1 capsule by mouth daily. Digestive Advantage Probiotic   rifaximin 550 MG Tabs tablet Commonly known as: XIFAXAN Take 1 tablet (550 mg total) by mouth 2 (two) times daily.   SYSTANE OP Place 1 drop into both eyes daily.   VICKS VAPOR IN Vicks Vapor Rub apply small amount to outside of nose to help breathing   VITAMIN B 12 PO Take 1,000 mcg by mouth daily.  Allergies  Allergen Reactions  . Kiwi Extract Anaphylaxis  . Tdap [Tetanus-Diphth-Acell Pertussis] Swelling and Other (See Comments)    Swelling at injection site, gets very hot  . Statins Other (See Comments)    RHABDOMYOLYSIS  . Latex Itching, Dermatitis and Rash  . Tramadol Nausea And Vomiting    Consultations:  GI   Procedures/Studies: CT Head Wo Contrast  Result Date: 05/15/2019 CLINICAL DATA:  Altered mental status.  Confusion. EXAM: CT HEAD WITHOUT CONTRAST TECHNIQUE: Contiguous axial images were obtained from the base of the skull through the vertex without intravenous contrast. COMPARISON:  None. FINDINGS: Brain: The ventricles and sulci  appear normal in size and contour. There is no intracranial mass, hemorrhage, extra-axial fluid collection, or midline shift. There is mild small vessel disease in the centra semiovale bilaterally. No acute appearing infarct is evident on this study. Vascular: There is no hyperdense vessel. There is calcification in each carotid siphon region. Skull: The bony calvarium appears intact. Sinuses/Orbits: There is mucosal thickening in several ethmoid air cells. Other visualized paranasal sinuses are clear. Orbits appear symmetric bilaterally. Other: Mastoid air cells are clear. IMPRESSION: Mild periventricular small vessel disease. No acute infarct. No mass or hemorrhage. There are foci of arterial vascular calcification. There is mucosal thickening in several ethmoid air cells. Electronically Signed   By: Lowella Grip III M.D.   On: 05/15/2019 11:37   MR LIVER W WO CONTRAST  Result Date: 05/30/2019 CLINICAL DATA:  Inpatient. Cirrhosis, admitted with a patent encephalopathy. History of TIPS placed 04/08/2019. Right liver mass on recent ultrasound. EXAM: MRI ABDOMEN WITHOUT AND WITH CONTRAST TECHNIQUE: Multiplanar multisequence MR imaging of the abdomen was performed both before and after the administration of intravenous contrast. CONTRAST:  47m GADAVIST GADOBUTROL 1 MMOL/ML IV SOLN COMPARISON:  05/29/2019 liver Doppler scan. 03/29/2019 CT abdomen/pelvis. 04/07/2017 MRI abdomen. FINDINGS: Scan significantly limited by motion degradation, particularly on the postcontrast sequences. Lower chest: Trace dependent bilateral pleural effusions. Hepatobiliary: Diffusely irregular liver surface compatible with cirrhosis. No hepatic steatosis. There is a 3.0 x 2.9 cm right liver lobe masslike focus (series 5/image 8) with heterogeneous mild T2 hyperintensity, restricted diffusion, mixed T1 signal with peripheral T1 hyperintensity and no compelling evidence of enhancement on the substantially motion degraded postcontrast  sequences, located just lateral to the midportion of the TIPS and new since 04/06/2019 CT. No additional liver lesions. Numerous subcentimeter gallstones layering in the mildly distended gallbladder. Mild diffuse gallbladder wall thickening. No biliary ductal dilatation. Common bile duct diameter 6 mm. No appreciable choledocholithiasis, biliary strictures, biliary masses or beading on the motion degraded sequences. Pancreas: No pancreatic mass or duct dilation. Spleen: Normal size. No mass. Adrenals/Urinary Tract: Normal adrenals. No hydronephrosis. Normal kidneys with no renal mass. Stomach/Bowel: Normal non-distended stomach. Visualized small and large bowel is normal caliber, with no definite bowel wall thickening. Vascular/Lymphatic: Normal caliber abdominal aorta. Patent splenic and renal veins. Well-positioned TIPS extending proximally from the main portal vein to the junction of the right hepatic vein and IVC, which appears patent on the limited motion degraded postcontrast sequences. Moderate paraumbilical and gastrosplenic varices. Small lower esophageal varices. No pathologically enlarged lymph nodes in the abdomen. Other: Small volume ascites. Musculoskeletal: No aggressive appearing focal osseous lesions. Bilateral posterior spinal fusion hardware in the lower lumbar spine. IMPRESSION: 1. Limited motion degraded scan. 2. Cirrhosis. Right liver lobe 3.0 cm mass with mixed signal intensity, restricted diffusion and no compelling evidence of enhancement on this motion degraded scan, located just lateral to  the midportion of the TIPS, new since most recent comparison study (04/06/2019 CT). This mass remains indeterminate for neoplasm on the basis of this limited MRI study, however findings favor a biloma. Infected biloma cannot be excluded. Percutaneous biopsy could be considered. Short-term outpatient follow-up MRI abdomen without and with IV contrast recommended in 3 months. 3. TIPS appears well  positioned and patent. 4. Small volume ascites. 5. Small lower esophageal, moderate paraumbilical and moderate gastrosplenic varices. 6. Trace dependent bilateral pleural effusions. Electronically Signed   By: Ilona Sorrel M.D.   On: 05/30/2019 20:15   DG Chest Port 1 View  Result Date: 05/26/2019 CLINICAL DATA:  Nausea, vomiting and weakness since last Tuesday. EXAM: PORTABLE CHEST 1 VIEW COMPARISON:  Chest x-ray 05/17/2019 FINDINGS: The cardiac silhouette, mediastinal and hilar contours are within normal limits. Mild tortuosity and calcification of the thoracic aorta. Stable surgical changes from bypass surgery. The lungs are clear of an acute process. No pleural effusions or pulmonary lesions. The bony thorax is intact. IMPRESSION: No acute cardiopulmonary findings. Electronically Signed   By: Marijo Sanes M.D.   On: 05/26/2019 16:02   DG CHEST PORT 1 VIEW  Result Date: 05/17/2019 CLINICAL DATA:  Mild cough EXAM: PORTABLE CHEST 1 VIEW COMPARISON:  05/15/2019 FINDINGS: Cardiac shadow is stable. Postsurgical changes are again seen. Tip shunt is noted in the upper abdomen. The lungs are well aerated without focal infiltrate or sizable effusion. No bony abnormality is noted. IMPRESSION: No acute abnormality seen. Electronically Signed   By: Inez Catalina M.D.   On: 05/17/2019 20:25   DG Chest Portable 1 View  Result Date: 05/15/2019 CLINICAL DATA:  Altered mental status EXAM: PORTABLE CHEST 1 VIEW COMPARISON:  04/01/2019 FINDINGS: Prior CABG. Heart is normal size. Lungs are clear. No effusions. No acute bony abnormality. IMPRESSION: No active disease. Electronically Signed   By: Rolm Baptise M.D.   On: 05/15/2019 10:46   [place] ASCITES (ABDOMEN LIMITED)  Result Date: 05/27/2019 CLINICAL DATA:  Cirrhosis of the liver. EXAM: LIMITED ABDOMEN ULTRASOUND FOR ASCITES TECHNIQUE: Limited ultrasound survey for ascites was performed in all four abdominal quadrants. COMPARISON:  Abdomen and pelvis CT dated 04/06/2019.  FINDINGS: Sonographic survey of the 4 quadrants of the abdomen and midline abdomen demonstrated no sonographically visible free peritoneal fluid. IMPRESSION: No free peritoneal fluid. Electronically Signed   By: Claudie Revering M.D.   On: 05/27/2019 16:50   US LIVER DOPPLER  Result Date: 05/29/2019 CLINICAL DATA:  History of TIPS placement on 04/08/2019 to treat bleeding esophageal varices. History of cirrhosis and portal hypertension. Admission currently for treatment of hepatic encephalopathy. Assessment of TIPS patency and internal velocities. EXAM: DUPLEX ULTRASOUND OF LIVER AND TIPS SHUNT TECHNIQUE: Color and duplex Doppler ultrasound was performed to evaluate the hepatic in-flow and out-flow vessels. COMPARISON:  TIPS procedure on 04/08/2019 and CT of the abdomen on 04/06/2019. FINDINGS: Portal Vein Velocities Main:  79 cm/sec No evidence of portal vein thrombus. Hepatopetal flow towards the liver. TIPS Stent Velocities Proximal:  177 cm/sec Mid:  129 Distal:  201 cm/sec No visible stenosis or thrombus. IVC: Patent.  Resent and patent with normal respiratory phasicity. Hepatic Vein Velocities Right:  Not able to be sample due to overlying TIPS shunt. Mid:  45 cm/sec Left:  31 cm/sec Splenic Vein: 49 Superior Mesenteric Vein: Not visualized. Hepatic Artery: 149 Ascites: Small amount of ascites visualized adjacent to the liver. Varices: None visualized. Other findings: Within the liver, a focal heterogeneous rounded area is noted  in the right lobe measuring roughly 3.6 x 3.8 x 2.8 cm. This is suspicious for a hepatic mass. No evidence of biliary ductal dilatation. The liver demonstrates morphology consistent with cirrhosis. IMPRESSION: 1. Widely patent TIPS shunt with normal velocities. 2. Mass-like area in the right lobe of the liver suspicious for malignancy measuring up to approximately 3.8 cm in diameter. Recommend correlation with serum AFP level and further imaging correlation with MRI of the abdomen with  and without gadolinium. When the patient is clinically stable and able to follow directions and hold their breath (preferably as an outpatient) further evaluation with dedicated abdominal MRI should be considered. Electronically Signed   By: Aletta Edouard M.D.   On: 05/29/2019 11:07     Subjective: "I feel better. I was told I have to mind them."  Discharge Exam: Vitals:   06/01/19 0817 06/01/19 1100  BP: (!) 117/50 (!) 114/58  Pulse: 86 83  Resp: 20 19  Temp: 98 F (36.7 C) 97.7 F (36.5 C)  SpO2: 99% 99%   Vitals:   05/31/19 2343 06/01/19 0422 06/01/19 0817 06/01/19 1100  BP: (!) 106/51 (!) 115/52 (!) 117/50 (!) 114/58  Pulse: 81 82 86 83  Resp: 18 18 20 19   Temp: 98.4 F (36.9 C) 98 F (36.7 C) 98 F (36.7 C) 97.7 F (36.5 C)  TempSrc: Oral Oral Oral Oral  SpO2: 95% 97% 99% 99%    General: 74 y.o. female resting in bed in NAD Cardiovascular: RRR, +S1, S2, no m/g/r Respiratory: CTABL, no w/r/r, normal WOB GI: BS+, NDNT, soft MSK: No e/c/c Neuro: A&O x 3, no focal deficits Psyc: Appropriate interaction and affect, calm/cooperative  The results of significant diagnostics from this hospitalization (including imaging, microbiology, ancillary and laboratory) are listed below for reference.     Microbiology: Recent Results (from the past 240 hour(s))  Urine culture     Status: Abnormal   Collection Time: 05/26/19  3:07 PM   Specimen: Urine, Clean Catch  Result Value Ref Range Status   Specimen Description URINE, CLEAN CATCH  Final   Special Requests   Final    NONE Performed at [facility], [place] [address] [place], [place] [address]    Culture >=100,000 COLONIES/mL ENTEROBACTER AEROGENES (A)  Final   Report Status 05/28/2019 FINAL  Final   Organism ID, Bacteria ENTEROBACTER AEROGENES (A)  Final      Susceptibility   Enterobacter aerogenes - MIC*    CEFAZOLIN >=64 RESISTANT Resistant     CEFTRIAXONE <=0.25 SENSITIVE Sensitive     CIPROFLOXACIN <=0.25  SENSITIVE Sensitive     GENTAMICIN <=1 SENSITIVE Sensitive     IMIPENEM 1 SENSITIVE Sensitive     NITROFURANTOIN 128 RESISTANT Resistant     TRIMETH/SULFA <=20 SENSITIVE Sensitive     PIP/TAZO <=4 SENSITIVE Sensitive     * >=100,000 COLONIES/mL ENTEROBACTER AEROGENES  Respiratory Panel by RT PCR (Flu A&B, Covid) - Nasopharyngeal Swab     Status: None   Collection Time: 05/26/19  4:30 PM   Specimen: Nasopharyngeal Swab  Result Value Ref Range Status   SARS Coronavirus 2 by RT PCR NEGATIVE NEGATIVE Final    Comment: (NOTE) SARS-CoV-2 target nucleic acids are NOT DETECTED. The SARS-CoV-2 RNA is generally detectable in upper respiratoy specimens during the acute phase of infection. The lowest concentration of SARS-CoV-2 viral copies this assay can detect is 131 copies/mL. A negative result does not preclude SARS-Cov-2 infection and should not be used as the  sole basis for treatment or other patient management decisions. A negative result may occur with  improper specimen collection/handling, submission of specimen other than nasopharyngeal swab, presence of viral mutation(s) within the areas targeted by this assay, and inadequate number of viral copies (<131 copies/mL). A negative result must be combined with clinical observations, patient history, and epidemiological information. The expected result is Negative. Fact Sheet for Patients:  PinkCheek.be Fact Sheet for Healthcare Providers:  GravelBags.it This test is not yet ap proved or cleared by the [place] FDA and  has been authorized for detection and/or diagnosis of SARS-CoV-2 by FDA under an Emergency Use Authorization (EUA). This EUA will remain  in effect (meaning this test can be used) for the duration of the COVID-19 declaration under Section 564(b)(1) of the Act, 21 U.S.C. section 360bbb-3(b)(1), unless the authorization is terminated or revoked sooner.     Influenza A by PCR NEGATIVE NEGATIVE Final   Influenza B by PCR NEGATIVE NEGATIVE Final    Comment: (NOTE) The Xpert Xpress SARS-CoV-2/FLU/RSV assay is intended as an aid in  the diagnosis of influenza from Nasopharyngeal swab specimens and  should not be used as a sole basis for treatment. Nasal washings and  aspirates are unacceptable for Xpert Xpress SARS-CoV-2/FLU/RSV  testing. Fact Sheet for Patients: PinkCheek.be Fact Sheet for Healthcare Providers: GravelBags.it This test is not yet approved or cleared by the [place] FDA and  has been authorized for detection and/or diagnosis of SARS-CoV-2 by  FDA under an Emergency Use Authorization (EUA). This EUA will remain  in effect (meaning this test can be used) for the duration of the  Covid-19 declaration under Section 564(b)(1) of the Act, 21  U.S.C. section 360bbb-3(b)(1), unless the authorization is  terminated or revoked. Performed at [facility], [place] [address], [place], [place] [address]      Labs: BNP (last 3 results) No results for input(s): BNP in the last 8760 hours. Basic Metabolic Panel: Recent Labs  Lab 05/26/19 1520 05/26/19 1647 05/28/19 0257 05/29/19 0256 05/30/19 0424 05/31/19 0500 06/01/19 0239  NA  --    < > 136 135 139 137 140  K  --    < > 3.6 3.1* 4.0 3.6 3.6  CL  --    < > 107 109 111 110 116*  CO2  --    < > 22 20* 20* 19* 18*  GLUCOSE  --    < > 181* 175* 192* 239* 239*  BUN  --    < > 15 13 13 15 12   CREATININE  --    < > 1.17* 0.97 0.88 0.93 0.82  CALCIUM  --    < > 9.9 9.2 9.6 9.1 9.1  MG 2.7*  --  2.3  --  2.0 1.9 1.6*  PHOS  --   --  1.6* 1.7* 2.0* 3.3 3.5   < > = values in this interval not displayed.   Liver Function Tests: Recent Labs  Lab 05/26/19 1332 05/26/19 1332 05/27/19 0352 05/27/19 0352 05/28/19 0257 05/29/19 0256 05/30/19 0424 05/31/19 0500 06/01/19 0239  AST 60*  --  53*  --   --   --   --   --    --   ALT 45*  --  36  --   --   --   --   --   --   ALKPHOS 190*  --  148*  --   --   --   --   --   --  BILITOT 3.1*  --  2.2*  --   --   --   --   --   --   PROT 7.4  --  5.8*  --   --   --   --   --   --   ALBUMIN 2.8*   < > 2.2*   < > 2.0* 1.9* 2.1* 2.0* 1.9*   < > = values in this interval not displayed.   Recent Labs  Lab 05/26/19 1332  LIPASE 25   Recent Labs  Lab 05/26/19 1521  AMMONIA 41*   CBC: Recent Labs  Lab 05/27/19 0352 05/28/19 1222 05/30/19 0424 05/31/19 0500 06/01/19 0239  WBC 5.9 6.7 3.9* 3.5* 3.9*  NEUTROABS  --   --  2.1 1.8 2.2  HGB 13.5 13.1 12.9 12.4 12.2  HCT 41.4 39.3 38.8 36.3 35.4*  MCV 99.8 98.5 99.2 98.9 97.3  PLT 98* 79* 76* 77* 82*   Cardiac Enzymes: No results for input(s): CKTOTAL, CKMB, CKMBINDEX, TROPONINI in the last 168 hours. BNP: Invalid input(s): POCBNP CBG: Recent Labs  Lab 05/31/19 1545 05/31/19 2137 06/01/19 0628 06/01/19 1222 06/01/19 1249  GLUCAP 239* 232* 203* 210* 210*   D-Dimer No results for input(s): DDIMER in the last 72 hours. Hgb A1c No results for input(s): HGBA1C in the last 72 hours. Lipid Profile No results for input(s): CHOL, HDL, LDLCALC, TRIG, CHOLHDL, LDLDIRECT in the last 72 hours. Thyroid function studies No results for input(s): TSH, T4TOTAL, T3FREE, THYROIDAB in the last 72 hours.  Invalid input(s): FREET3 Anemia work up No results for input(s): VITAMINB12, FOLATE, FERRITIN, TIBC, IRON, RETICCTPCT in the last 72 hours. Urinalysis    Component Value Date/Time   COLORURINE YELLOW 05/26/2019 1525   APPEARANCEUR HAZY (A) 05/26/2019 1525   LABSPEC 1.018 05/26/2019 1525   PHURINE 6.0 05/26/2019 1525   GLUCOSEU >=500 (A) 05/26/2019 1525   HGBUR NEGATIVE 05/26/2019 1525   BILIRUBINUR NEGATIVE 05/26/2019 1525   BILIRUBINUR neg 05/05/2012 1445   KETONESUR NEGATIVE 05/26/2019 1525   PROTEINUR NEGATIVE 05/26/2019 1525   UROBILINOGEN 0.2 05/05/2012 1445   UROBILINOGEN 0.2 12/18/2011 1409    NITRITE NEGATIVE 05/26/2019 1525   LEUKOCYTESUR MODERATE (A) 05/26/2019 1525   Sepsis Labs Invalid input(s): PROCALCITONIN,  WBC,  LACTICIDVEN Microbiology Recent Results (from the past 240 hour(s))  Urine culture     Status: Abnormal   Collection Time: 05/26/19  3:07 PM   Specimen: Urine, Clean Catch  Result Value Ref Range Status   Specimen Description URINE, CLEAN CATCH  Final   Special Requests   Final    NONE Performed at [facility], [place] [address], [place], [place] [address]    Culture >=100,000 COLONIES/mL ENTEROBACTER AEROGENES (A)  Final   Report Status 05/28/2019 FINAL  Final   Organism ID, Bacteria ENTEROBACTER AEROGENES (A)  Final      Susceptibility   Enterobacter aerogenes - MIC*    CEFAZOLIN >=64 RESISTANT Resistant     CEFTRIAXONE <=0.25 SENSITIVE Sensitive     CIPROFLOXACIN <=0.25 SENSITIVE Sensitive     GENTAMICIN <=1 SENSITIVE Sensitive     IMIPENEM 1 SENSITIVE Sensitive     NITROFURANTOIN 128 RESISTANT Resistant     TRIMETH/SULFA <=20 SENSITIVE Sensitive     PIP/TAZO <=4 SENSITIVE Sensitive     * >=100,000 COLONIES/mL ENTEROBACTER AEROGENES  Respiratory Panel by RT PCR (Flu A&B, Covid) - Nasopharyngeal Swab     Status: None   Collection Time: 05/26/19  4:30 PM  Specimen: Nasopharyngeal Swab  Result Value Ref Range Status   SARS Coronavirus 2 by RT PCR NEGATIVE NEGATIVE Final    Comment: (NOTE) SARS-CoV-2 target nucleic acids are NOT DETECTED. The SARS-CoV-2 RNA is generally detectable in upper respiratoy specimens during the acute phase of infection. The lowest concentration of SARS-CoV-2 viral copies this assay can detect is 131 copies/mL. A negative result does not preclude SARS-Cov-2 infection and should not be used as the sole basis for treatment or other patient management decisions. A negative result may occur with  improper specimen collection/handling, submission of specimen other than nasopharyngeal swab, presence of viral mutation(s)  within the areas targeted by this assay, and inadequate number of viral copies (<131 copies/mL). A negative result must be combined with clinical observations, patient history, and epidemiological information. The expected result is Negative. Fact Sheet for Patients:  PinkCheek.be Fact Sheet for Healthcare Providers:  GravelBags.it This test is not yet ap proved or cleared by the [place] FDA and  has been authorized for detection and/or diagnosis of SARS-CoV-2 by FDA under an Emergency Use Authorization (EUA). This EUA will remain  in effect (meaning this test can be used) for the duration of the COVID-19 declaration under Section 564(b)(1) of the Act, 21 U.S.C. section 360bbb-3(b)(1), unless the authorization is terminated or revoked sooner.    Influenza A by PCR NEGATIVE NEGATIVE Final   Influenza B by PCR NEGATIVE NEGATIVE Final    Comment: (NOTE) The Xpert Xpress SARS-CoV-2/FLU/RSV assay is intended as an aid in  the diagnosis of influenza from Nasopharyngeal swab specimens and  should not be used as a sole basis for treatment. Nasal washings and  aspirates are unacceptable for Xpert Xpress SARS-CoV-2/FLU/RSV  testing. Fact Sheet for Patients: PinkCheek.be Fact Sheet for Healthcare Providers: GravelBags.it This test is not yet approved or cleared by the [place] FDA and  has been authorized for detection and/or diagnosis of SARS-CoV-2 by  FDA under an Emergency Use Authorization (EUA). This EUA will remain  in effect (meaning this test can be used) for the duration of the  Covid-19 declaration under Section 564(b)(1) of the Act, 21  U.S.C. section 360bbb-3(b)(1), unless the authorization is  terminated or revoked. Performed at [facility], [place] [address], [place], [place] [address]      Time coordinating discharge: 35  minutes  SIGNED:   Jonnie Finner, DO  [facility] 06/01/2019, 3:08 PM   If 7PM-7AM, please contact night-coverage [email or web address]

## 2019-06-01 NOTE — TOC Transition Note (Signed)
Transition of Care [facility]) - CM/SW Discharge Note   Patient Details  Name: [name] MRN: [id number] Date of Birth: 09/06/1945  [facility] CM/SW Contact:  Carles Collet, RN Phone Number: 06/01/2019, 1:17 PM   Clinical Narrative:    Patient active w [place]. Notified liaison of DC today. Spoke w patient's spouse, he will be providing transportation home. No other CM needs.     Final next level of care: [place] Barriers to Discharge: No Barriers Identified   Patient Goals and CMS Choice Patient states their goals for this hospitalization and ongoing recovery are:: to go home CMS Medicare.gov Compare Post Acute Care list provided to:: Patient Choice offered to / list presented to : Spouse  Discharge Placement                       Discharge Plan and Services                          HH Arranged: RN, PT [facility] Agency: [place] ([facility]) Date [place]: 06/01/19 Time [place]: [place] Representative spoke with at [place]: [place] ([place]) Interventions     Readmission Risk Interventions Readmission Risk Prevention Plan 04/12/2019  Transportation Screening Complete  PCP or Specialist Appt within 3-5 Days Complete  HRI or [place] Complete  Social Work Consult for [place] Planning/Counseling Complete  Palliative Care Screening Not Applicable  Medication Review Press photographer) Complete  Some recent data might be hidden

## 2019-06-01 NOTE — Progress Notes (Deleted)
. Physician Discharge Summary  [name] XMI:[id number] DOB: 1945-12-05 DOA: 05/26/2019  PCP: Gayland Curry, DO  Admit date: 05/26/2019 Discharge date: 06/01/2019  Admitted From: Home Disposition:  Discharged to home with [facility]  Recommendations for Outpatient Follow-up:  1. Follow up with PCP in 1-2 weeks 2. Please obtain BMP/CBC in one week  Discharge Condition: Stable  CODE STATUS: FULL   Brief/Interim Summary: 74 year old lady with prior h/o hypertension, DM, CAD, s/p CABG, non alcoholic cirrhosis admitted for hypercalcemia, dehydration, generalized weakness and fatigue. On arrival to ED, she was found to be hypercalcemicpossibly from multiple calcium supplements and vitamin D prior to admission and altered mental status. She was started on IV fluidsand calcium has improved. Gastroenterology consulted for nonalcoholic liver cirrhosis. Patient seen and examined today she is alert and responding to questions appropriately.  06/01/19: Looks better this morning. Talked about nutrition supplementation and getting her stronger through nutrition and PT. Will send home with HHPT. Will have follow up with Dr. Burr Medico. Needs to follow up with PCP and GI as well. Her BP remains soft. Holding diuretics for now. Readdress with GI/PCP. She looks good overall. Will d/c to home.   Discharge Diagnoses:  Active Problems:   Encephalopathy, hepatic (HCC)   Hypercalcemia   S/P TIPS (transjugular intrahepatic portosystemic shunt)  Hypercalcemia - Unclear etiology, possibly from dehydration -stable - PTH is 9, PTH related peptide active in process     - Ca2+ stable at 82.5  Acute metabolic encephalopathy  - likely secondary to hypercalcemia and possible hepatic issues - interviewed appropriately this AM     - 06/01/19: she is mentally appropriate today  Hyponatremia  Hypophosphatemia Hypokalemia Hypomagnesemia - Na+/K+/Phos resolved     - Continue home Mg2+  at discharge  Enterobacter UTI -completed bactrim regimen; improved  Acute kidney injury - resolved w/ fluids  DM2 - send home on her home regimen with modifications: change levemir to 10 units qHS for now and novolog 5 units WM; titrate as needed per PCP   Chronic thrombocytopenia: - Platelets have dropped from 133000 to 70,000's - no signs of frank bleed - likely d/t liver disease     - 06/01/19: 82k this AM; no evidence of bleed  Severe protein calorie malnutrition - appreciate dietary assistance - calorie counts; doing better  Liver cirrhosis - s/p TIPS for bleeding esophageal varices - Patient is currently on rifaximin, continue the same and lactulose started by gastroenterology. - IR evaluation for the size of TIPS patency/size: liver doppler has been ordered for further evaluation; TIPS shunt widely patent w/ normal velocities per radiology; rec obtaining AFP and MRI d/t right liver lobe mass seen on doppler - AFP 1.6; MRI Liver see below     - holding aldactone, lasix d/t soft pressures; follow up with GI  Severe debility and deconditioning - Likely from CVA and prolonged hospitalization - Continue PT inpatient hospital. - rec HHPT; TOC has arranged     - continue boost supplement and nutrition supplementation at discharge  Unwitnessed fall on 04/29/2019 - Fell at bedside chair when attempted to transfer to commode.  - Denies hitting his head; No loss of consciousness; No new focal neurologic deficit at that time. - Continue fall precautions  Liver mass -AFP 1.6; MRI liver pending     - MRI liver: 2. Cirrhosis. Right liver lobe 3.0 cm mass with mixed signal intensity, restricted diffusion and no compelling evidence of enhancement on this motion degraded scan, located just  lateral to the midportion of the TIPS, new since most recent comparison study (04/06/2019 CT). This mass  remains indeterminate for neoplasm on the basis of this limited MRI study, however findings favor a biloma. Infected biloma cannot be excluded. Percutaneous biopsy could be considered. Short-term outpatient follow-up MRI abdomen without and with IV contrast recommended in 3 months     - reviewed with medical onco; will arrange for quick follow up  Discharge Instructions   Allergies as of 06/01/2019      Reactions   Kiwi Extract Anaphylaxis   Tdap [tetanus-diphth-acell Pertussis] Swelling, Other (See Comments)   Swelling at injection site, gets very hot   Statins Other (See Comments)   RHABDOMYOLYSIS   Latex Itching, Dermatitis, Rash   Tramadol Nausea And Vomiting      Medication List    STOP taking these medications   calcium carbonate 600 MG Tabs tablet Commonly known as: OS-CAL   Lasix 40 MG tablet Generic drug: furosemide   spironolactone 50 MG tablet Commonly known as: ALDACTONE   Vitamin D 50 MCG (2000 UT) Caps     TAKE these medications   acetaminophen 500 MG tablet Commonly known as: TYLENOL Take 500 mg by mouth at bedtime.   BD Pen Needle Nano U/F 32G X 4 MM Misc Generic drug: Insulin Pen Needle USE THREE TIMES DAILY AS DIRECTED   Biotin 10000 MCG Tabs Take 10,000 mcg by mouth every morning.   bisacodyl 10 MG suppository Commonly known as: DULCOLAX Place 1 suppository (10 mg total) rectally daily as needed for moderate constipation.   ezetimibe 10 MG tablet Commonly known as: ZETIA TAKE 1 TABLET(10 MG) BY MOUTH DAILY   feeding supplement (PRO-STAT SUGAR FREE 64) Liqd Take 30 mLs by mouth 3 (three) times daily with meals.   glucose blood test strip One Touch Ultra II strips. Use to test blood sugar three times daily. Dx: E11.65   insulin detemir 100 UNIT/ML injection Commonly known as: Levemir Inject 0.1 mLs (10 Units total) into the skin at bedtime. What changed: how much to take   INSULIN SYRINGE 1CC/31GX5/16" 31G X 5/16" 1 ML Misc USE AS  DIRECTED DAILY WITH LEVEMIR   Jardiance 25 MG Tabs tablet Generic drug: empagliflozin Take 25 mg by mouth daily.   lactulose 10 GM/15ML solution Commonly known as: CHRONULAC Take 45 mLs (30 g total) by mouth 4 (four) times daily.   loratadine 10 MG tablet Commonly known as: CLARITIN Take 10 mg by mouth daily as needed for allergies.   MAGNESIUM PO Take 500 mg by mouth daily.   multivitamin with minerals tablet Take 1 tablet by mouth daily.   NovoLOG FlexPen 100 UNIT/ML FlexPen Generic drug: insulin aspart Inject 5 units with breakfast, lunch, and dinner. What changed: additional instructions   ondansetron 4 MG tablet Commonly known as: ZOFRAN Take 1 tablet (4 mg total) by mouth every 6 (six) hours as needed for nausea.   pantoprazole 40 MG tablet Commonly known as: Protonix Take 1 tablet (40 mg total) by mouth daily.   PROBIOTIC DAILY PO Take 1 capsule by mouth daily. Digestive Advantage Probiotic   rifaximin 550 MG Tabs tablet Commonly known as: XIFAXAN Take 1 tablet (550 mg total) by mouth 2 (two) times daily.   SYSTANE OP Place 1 drop into both eyes daily.   VICKS VAPOR IN Vicks Vapor Rub apply small amount to outside of nose to help breathing   VITAMIN B 12 PO Take 1,000 mcg by mouth daily.  Allergies  Allergen Reactions  . Kiwi Extract Anaphylaxis  . Tdap [Tetanus-Diphth-Acell Pertussis] Swelling and Other (See Comments)    Swelling at injection site, gets very hot  . Statins Other (See Comments)    RHABDOMYOLYSIS  . Latex Itching, Dermatitis and Rash  . Tramadol Nausea And Vomiting    Consultations:  GI   Procedures/Studies: CT Head Wo Contrast  Result Date: 05/15/2019 CLINICAL DATA:  Altered mental status.  Confusion. EXAM: CT HEAD WITHOUT CONTRAST TECHNIQUE: Contiguous axial images were obtained from the base of the skull through the vertex without intravenous contrast. COMPARISON:  None. FINDINGS: Brain: The ventricles and sulci  appear normal in size and contour. There is no intracranial mass, hemorrhage, extra-axial fluid collection, or midline shift. There is mild small vessel disease in the centra semiovale bilaterally. No acute appearing infarct is evident on this study. Vascular: There is no hyperdense vessel. There is calcification in each carotid siphon region. Skull: The bony calvarium appears intact. Sinuses/Orbits: There is mucosal thickening in several ethmoid air cells. Other visualized paranasal sinuses are clear. Orbits appear symmetric bilaterally. Other: Mastoid air cells are clear. IMPRESSION: Mild periventricular small vessel disease. No acute infarct. No mass or hemorrhage. There are foci of arterial vascular calcification. There is mucosal thickening in several ethmoid air cells. Electronically Signed   By: Lowella Grip III M.D.   On: 05/15/2019 11:37   MR LIVER W WO CONTRAST  Result Date: 05/30/2019 CLINICAL DATA:  Inpatient. Cirrhosis, admitted with a patent encephalopathy. History of TIPS placed 04/08/2019. Right liver mass on recent ultrasound. EXAM: MRI ABDOMEN WITHOUT AND WITH CONTRAST TECHNIQUE: Multiplanar multisequence MR imaging of the abdomen was performed both before and after the administration of intravenous contrast. CONTRAST:  36m GADAVIST GADOBUTROL 1 MMOL/ML IV SOLN COMPARISON:  05/29/2019 liver Doppler scan. 03/29/2019 CT abdomen/pelvis. 04/07/2017 MRI abdomen. FINDINGS: Scan significantly limited by motion degradation, particularly on the postcontrast sequences. Lower chest: Trace dependent bilateral pleural effusions. Hepatobiliary: Diffusely irregular liver surface compatible with cirrhosis. No hepatic steatosis. There is a 3.0 x 2.9 cm right liver lobe masslike focus (series 5/image 8) with heterogeneous mild T2 hyperintensity, restricted diffusion, mixed T1 signal with peripheral T1 hyperintensity and no compelling evidence of enhancement on the substantially motion degraded postcontrast  sequences, located just lateral to the midportion of the TIPS and new since 04/06/2019 CT. No additional liver lesions. Numerous subcentimeter gallstones layering in the mildly distended gallbladder. Mild diffuse gallbladder wall thickening. No biliary ductal dilatation. Common bile duct diameter 6 mm. No appreciable choledocholithiasis, biliary strictures, biliary masses or beading on the motion degraded sequences. Pancreas: No pancreatic mass or duct dilation. Spleen: Normal size. No mass. Adrenals/Urinary Tract: Normal adrenals. No hydronephrosis. Normal kidneys with no renal mass. Stomach/Bowel: Normal non-distended stomach. Visualized small and large bowel is normal caliber, with no definite bowel wall thickening. Vascular/Lymphatic: Normal caliber abdominal aorta. Patent splenic and renal veins. Well-positioned TIPS extending proximally from the main portal vein to the junction of the right hepatic vein and IVC, which appears patent on the limited motion degraded postcontrast sequences. Moderate paraumbilical and gastrosplenic varices. Small lower esophageal varices. No pathologically enlarged lymph nodes in the abdomen. Other: Small volume ascites. Musculoskeletal: No aggressive appearing focal osseous lesions. Bilateral posterior spinal fusion hardware in the lower lumbar spine. IMPRESSION: 1. Limited motion degraded scan. 2. Cirrhosis. Right liver lobe 3.0 cm mass with mixed signal intensity, restricted diffusion and no compelling evidence of enhancement on this motion degraded scan, located just lateral to  the midportion of the TIPS, new since most recent comparison study (04/06/2019 CT). This mass remains indeterminate for neoplasm on the basis of this limited MRI study, however findings favor a biloma. Infected biloma cannot be excluded. Percutaneous biopsy could be considered. Short-term outpatient follow-up MRI abdomen without and with IV contrast recommended in 3 months. 3. TIPS appears well  positioned and patent. 4. Small volume ascites. 5. Small lower esophageal, moderate paraumbilical and moderate gastrosplenic varices. 6. Trace dependent bilateral pleural effusions. Electronically Signed   By: Ilona Sorrel M.D.   On: 05/30/2019 20:15   DG Chest Port 1 View  Result Date: 05/26/2019 CLINICAL DATA:  Nausea, vomiting and weakness since last Tuesday. EXAM: PORTABLE CHEST 1 VIEW COMPARISON:  Chest x-ray 05/17/2019 FINDINGS: The cardiac silhouette, mediastinal and hilar contours are within normal limits. Mild tortuosity and calcification of the thoracic aorta. Stable surgical changes from bypass surgery. The lungs are clear of an acute process. No pleural effusions or pulmonary lesions. The bony thorax is intact. IMPRESSION: No acute cardiopulmonary findings. Electronically Signed   By: Marijo Sanes M.D.   On: 05/26/2019 16:02   DG CHEST PORT 1 VIEW  Result Date: 05/17/2019 CLINICAL DATA:  Mild cough EXAM: PORTABLE CHEST 1 VIEW COMPARISON:  05/15/2019 FINDINGS: Cardiac shadow is stable. Postsurgical changes are again seen. Tip shunt is noted in the upper abdomen. The lungs are well aerated without focal infiltrate or sizable effusion. No bony abnormality is noted. IMPRESSION: No acute abnormality seen. Electronically Signed   By: Inez Catalina M.D.   On: 05/17/2019 20:25   DG Chest Portable 1 View  Result Date: 05/15/2019 CLINICAL DATA:  Altered mental status EXAM: PORTABLE CHEST 1 VIEW COMPARISON:  04/01/2019 FINDINGS: Prior CABG. Heart is normal size. Lungs are clear. No effusions. No acute bony abnormality. IMPRESSION: No active disease. Electronically Signed   By: Rolm Baptise M.D.   On: 05/15/2019 10:46   [place] ASCITES (ABDOMEN LIMITED)  Result Date: 05/27/2019 CLINICAL DATA:  Cirrhosis of the liver. EXAM: LIMITED ABDOMEN ULTRASOUND FOR ASCITES TECHNIQUE: Limited ultrasound survey for ascites was performed in all four abdominal quadrants. COMPARISON:  Abdomen and pelvis CT dated 04/06/2019.  FINDINGS: Sonographic survey of the 4 quadrants of the abdomen and midline abdomen demonstrated no sonographically visible free peritoneal fluid. IMPRESSION: No free peritoneal fluid. Electronically Signed   By: Claudie Revering M.D.   On: 05/27/2019 16:50   US LIVER DOPPLER  Result Date: 05/29/2019 CLINICAL DATA:  History of TIPS placement on 04/08/2019 to treat bleeding esophageal varices. History of cirrhosis and portal hypertension. Admission currently for treatment of hepatic encephalopathy. Assessment of TIPS patency and internal velocities. EXAM: DUPLEX ULTRASOUND OF LIVER AND TIPS SHUNT TECHNIQUE: Color and duplex Doppler ultrasound was performed to evaluate the hepatic in-flow and out-flow vessels. COMPARISON:  TIPS procedure on 04/08/2019 and CT of the abdomen on 04/06/2019. FINDINGS: Portal Vein Velocities Main:  79 cm/sec No evidence of portal vein thrombus. Hepatopetal flow towards the liver. TIPS Stent Velocities Proximal:  177 cm/sec Mid:  129 Distal:  201 cm/sec No visible stenosis or thrombus. IVC: Patent.  Resent and patent with normal respiratory phasicity. Hepatic Vein Velocities Right:  Not able to be sample due to overlying TIPS shunt. Mid:  45 cm/sec Left:  31 cm/sec Splenic Vein: 49 Superior Mesenteric Vein: Not visualized. Hepatic Artery: 149 Ascites: Small amount of ascites visualized adjacent to the liver. Varices: None visualized. Other findings: Within the liver, a focal heterogeneous rounded area is noted  in the right lobe measuring roughly 3.6 x 3.8 x 2.8 cm. This is suspicious for a hepatic mass. No evidence of biliary ductal dilatation. The liver demonstrates morphology consistent with cirrhosis. IMPRESSION: 1. Widely patent TIPS shunt with normal velocities. 2. Mass-like area in the right lobe of the liver suspicious for malignancy measuring up to approximately 3.8 cm in diameter. Recommend correlation with serum AFP level and further imaging correlation with MRI of the abdomen with  and without gadolinium. When the patient is clinically stable and able to follow directions and hold their breath (preferably as an outpatient) further evaluation with dedicated abdominal MRI should be considered. Electronically Signed   By: Aletta Edouard M.D.   On: 05/29/2019 11:07      Subjective: "I feel better. I was told I have to mind them."  Discharge Exam: Vitals:   06/01/19 0422 06/01/19 0817  BP: (!) 115/52 (!) 117/50  Pulse: 82 86  Resp: 18 20  Temp: 98 F (36.7 C) 98 F (36.7 C)  SpO2: 97% 99%   Vitals:   05/31/19 2027 05/31/19 2343 06/01/19 0422 06/01/19 0817  BP: (!) 118/50 (!) 106/51 (!) 115/52 (!) 117/50  Pulse: 81 81 82 86  Resp: 17 18 18 20   Temp: 98.3 F (36.8 C) 98.4 F (36.9 C) 98 F (36.7 C) 98 F (36.7 C)  TempSrc: Oral Oral Oral Oral  SpO2: 95% 95% 97% 99%    General: 74 y.o. female resting in bed in NAD Cardiovascular: RRR, +S1, S2, no m/g/r Respiratory: CTABL, no w/r/r, normal WOB GI: BS+, NDNT, soft MSK: No e/c/c Neuro: A&O x 3, no focal deficits Psyc: Appropriate interaction and affect, calm/cooperative  The results of significant diagnostics from this hospitalization (including imaging, microbiology, ancillary and laboratory) are listed below for reference.     Microbiology: Recent Results (from the past 240 hour(s))  Urine culture     Status: Abnormal   Collection Time: 05/26/19  3:07 PM   Specimen: Urine, Clean Catch  Result Value Ref Range Status   Specimen Description URINE, CLEAN CATCH  Final   Special Requests   Final    NONE Performed at [facility], [place] [address], [place], [place] [address]    Culture >=100,000 COLONIES/mL ENTEROBACTER AEROGENES (A)  Final   Report Status 05/28/2019 FINAL  Final   Organism ID, Bacteria ENTEROBACTER AEROGENES (A)  Final      Susceptibility   Enterobacter aerogenes - MIC*    CEFAZOLIN >=64 RESISTANT Resistant     CEFTRIAXONE <=0.25 SENSITIVE Sensitive     CIPROFLOXACIN <=0.25  SENSITIVE Sensitive     GENTAMICIN <=1 SENSITIVE Sensitive     IMIPENEM 1 SENSITIVE Sensitive     NITROFURANTOIN 128 RESISTANT Resistant     TRIMETH/SULFA <=20 SENSITIVE Sensitive     PIP/TAZO <=4 SENSITIVE Sensitive     * >=100,000 COLONIES/mL ENTEROBACTER AEROGENES  Respiratory Panel by RT PCR (Flu A&B, Covid) - Nasopharyngeal Swab     Status: None   Collection Time: 05/26/19  4:30 PM   Specimen: Nasopharyngeal Swab  Result Value Ref Range Status   SARS Coronavirus 2 by RT PCR NEGATIVE NEGATIVE Final    Comment: (NOTE) SARS-CoV-2 target nucleic acids are NOT DETECTED. The SARS-CoV-2 RNA is generally detectable in upper respiratoy specimens during the acute phase of infection. The lowest concentration of SARS-CoV-2 viral copies this assay can detect is 131 copies/mL. A negative result does not preclude SARS-Cov-2 infection and should not be used as  the sole basis for treatment or other patient management decisions. A negative result may occur with  improper specimen collection/handling, submission of specimen other than nasopharyngeal swab, presence of viral mutation(s) within the areas targeted by this assay, and inadequate number of viral copies (<131 copies/mL). A negative result must be combined with clinical observations, patient history, and epidemiological information. The expected result is Negative. Fact Sheet for Patients:  PinkCheek.be Fact Sheet for Healthcare Providers:  GravelBags.it This test is not yet ap proved or cleared by the [place] FDA and  has been authorized for detection and/or diagnosis of SARS-CoV-2 by FDA under an Emergency Use Authorization (EUA). This EUA will remain  in effect (meaning this test can be used) for the duration of the COVID-19 declaration under Section 564(b)(1) of the Act, 21 U.S.C. section 360bbb-3(b)(1), unless the authorization is terminated or revoked sooner.     Influenza A by PCR NEGATIVE NEGATIVE Final   Influenza B by PCR NEGATIVE NEGATIVE Final    Comment: (NOTE) The Xpert Xpress SARS-CoV-2/FLU/RSV assay is intended as an aid in  the diagnosis of influenza from Nasopharyngeal swab specimens and  should not be used as a sole basis for treatment. Nasal washings and  aspirates are unacceptable for Xpert Xpress SARS-CoV-2/FLU/RSV  testing. Fact Sheet for Patients: PinkCheek.be Fact Sheet for Healthcare Providers: GravelBags.it This test is not yet approved or cleared by the [place] FDA and  has been authorized for detection and/or diagnosis of SARS-CoV-2 by  FDA under an Emergency Use Authorization (EUA). This EUA will remain  in effect (meaning this test can be used) for the duration of the  Covid-19 declaration under Section 564(b)(1) of the Act, 21  U.S.C. section 360bbb-3(b)(1), unless the authorization is  terminated or revoked. Performed at [facility], [place] [address], [place], [place] [address]      Labs: BNP (last 3 results) No results for input(s): BNP in the last 8760 hours. Basic Metabolic Panel: Recent Labs  Lab 05/26/19 1520 05/26/19 1647 05/28/19 0257 05/29/19 0256 05/30/19 0424 05/31/19 0500 06/01/19 0239  NA  --    < > 136 135 139 137 140  K  --    < > 3.6 3.1* 4.0 3.6 3.6  CL  --    < > 107 109 111 110 116*  CO2  --    < > 22 20* 20* 19* 18*  GLUCOSE  --    < > 181* 175* 192* 239* 239*  BUN  --    < > 15 13 13 15 12   CREATININE  --    < > 1.17* 0.97 0.88 0.93 0.82  CALCIUM  --    < > 9.9 9.2 9.6 9.1 9.1  MG 2.7*  --  2.3  --  2.0 1.9 1.6*  PHOS  --   --  1.6* 1.7* 2.0* 3.3 3.5   < > = values in this interval not displayed.   Liver Function Tests: Recent Labs  Lab 05/26/19 1332 05/26/19 1332 05/27/19 0352 05/27/19 0352 05/28/19 0257 05/29/19 0256 05/30/19 0424 05/31/19 0500 06/01/19 0239  AST 60*  --  53*  --   --   --   --   --    --   ALT 45*  --  36  --   --   --   --   --   --   ALKPHOS 190*  --  148*  --   --   --   --   --   --  BILITOT 3.1*  --  2.2*  --   --   --   --   --   --   PROT 7.4  --  5.8*  --   --   --   --   --   --   ALBUMIN 2.8*   < > 2.2*   < > 2.0* 1.9* 2.1* 2.0* 1.9*   < > = values in this interval not displayed.   Recent Labs  Lab 05/26/19 1332  LIPASE 25   Recent Labs  Lab 05/26/19 1521  AMMONIA 41*   CBC: Recent Labs  Lab 05/27/19 0352 05/28/19 1222 05/30/19 0424 05/31/19 0500 06/01/19 0239  WBC 5.9 6.7 3.9* 3.5* 3.9*  NEUTROABS  --   --  2.1 1.8 2.2  HGB 13.5 13.1 12.9 12.4 12.2  HCT 41.4 39.3 38.8 36.3 35.4*  MCV 99.8 98.5 99.2 98.9 97.3  PLT 98* 79* 76* 77* 82*   Cardiac Enzymes: No results for input(s): CKTOTAL, CKMB, CKMBINDEX, TROPONINI in the last 168 hours. BNP: Invalid input(s): POCBNP CBG: Recent Labs  Lab 05/31/19 1206 05/31/19 1545 05/31/19 2137 06/01/19 0628 06/01/19 1222  GLUCAP 262* 239* 232* 203* 210*   D-Dimer No results for input(s): DDIMER in the last 72 hours. Hgb A1c No results for input(s): HGBA1C in the last 72 hours. Lipid Profile No results for input(s): CHOL, HDL, LDLCALC, TRIG, CHOLHDL, LDLDIRECT in the last 72 hours. Thyroid function studies No results for input(s): TSH, T4TOTAL, T3FREE, THYROIDAB in the last 72 hours.  Invalid input(s): FREET3 Anemia work up No results for input(s): VITAMINB12, FOLATE, FERRITIN, TIBC, IRON, RETICCTPCT in the last 72 hours. Urinalysis    Component Value Date/Time   COLORURINE YELLOW 05/26/2019 1525   APPEARANCEUR HAZY (A) 05/26/2019 1525   LABSPEC 1.018 05/26/2019 1525   PHURINE 6.0 05/26/2019 1525   GLUCOSEU >=500 (A) 05/26/2019 1525   HGBUR NEGATIVE 05/26/2019 1525   BILIRUBINUR NEGATIVE 05/26/2019 1525   BILIRUBINUR neg 05/05/2012 1445   KETONESUR NEGATIVE 05/26/2019 1525   PROTEINUR NEGATIVE 05/26/2019 1525   UROBILINOGEN 0.2 05/05/2012 1445   UROBILINOGEN 0.2 12/18/2011 1409    NITRITE NEGATIVE 05/26/2019 1525   LEUKOCYTESUR MODERATE (A) 05/26/2019 1525   Sepsis Labs Invalid input(s): PROCALCITONIN,  WBC,  LACTICIDVEN Microbiology Recent Results (from the past 240 hour(s))  Urine culture     Status: Abnormal   Collection Time: 05/26/19  3:07 PM   Specimen: Urine, Clean Catch  Result Value Ref Range Status   Specimen Description URINE, CLEAN CATCH  Final   Special Requests   Final    NONE Performed at [facility], [place] [address], [place], [place] [address]    Culture >=100,000 COLONIES/mL ENTEROBACTER AEROGENES (A)  Final   Report Status 05/28/2019 FINAL  Final   Organism ID, Bacteria ENTEROBACTER AEROGENES (A)  Final      Susceptibility   Enterobacter aerogenes - MIC*    CEFAZOLIN >=64 RESISTANT Resistant     CEFTRIAXONE <=0.25 SENSITIVE Sensitive     CIPROFLOXACIN <=0.25 SENSITIVE Sensitive     GENTAMICIN <=1 SENSITIVE Sensitive     IMIPENEM 1 SENSITIVE Sensitive     NITROFURANTOIN 128 RESISTANT Resistant     TRIMETH/SULFA <=20 SENSITIVE Sensitive     PIP/TAZO <=4 SENSITIVE Sensitive     * >=100,000 COLONIES/mL ENTEROBACTER AEROGENES  Respiratory Panel by RT PCR (Flu A&B, Covid) - Nasopharyngeal Swab     Status: None   Collection Time: 05/26/19  4:30 PM  Specimen: Nasopharyngeal Swab  Result Value Ref Range Status   SARS Coronavirus 2 by RT PCR NEGATIVE NEGATIVE Final    Comment: (NOTE) SARS-CoV-2 target nucleic acids are NOT DETECTED. The SARS-CoV-2 RNA is generally detectable in upper respiratoy specimens during the acute phase of infection. The lowest concentration of SARS-CoV-2 viral copies this assay can detect is 131 copies/mL. A negative result does not preclude SARS-Cov-2 infection and should not be used as the sole basis for treatment or other patient management decisions. A negative result may occur with  improper specimen collection/handling, submission of specimen other than nasopharyngeal swab, presence of viral mutation(s)  within the areas targeted by this assay, and inadequate number of viral copies (<131 copies/mL). A negative result must be combined with clinical observations, patient history, and epidemiological information. The expected result is Negative. Fact Sheet for Patients:  PinkCheek.be Fact Sheet for Healthcare Providers:  GravelBags.it This test is not yet ap proved or cleared by the [place] FDA and  has been authorized for detection and/or diagnosis of SARS-CoV-2 by FDA under an Emergency Use Authorization (EUA). This EUA will remain  in effect (meaning this test can be used) for the duration of the COVID-19 declaration under Section 564(b)(1) of the Act, 21 U.S.C. section 360bbb-3(b)(1), unless the authorization is terminated or revoked sooner.    Influenza A by PCR NEGATIVE NEGATIVE Final   Influenza B by PCR NEGATIVE NEGATIVE Final    Comment: (NOTE) The Xpert Xpress SARS-CoV-2/FLU/RSV assay is intended as an aid in  the diagnosis of influenza from Nasopharyngeal swab specimens and  should not be used as a sole basis for treatment. Nasal washings and  aspirates are unacceptable for Xpert Xpress SARS-CoV-2/FLU/RSV  testing. Fact Sheet for Patients: PinkCheek.be Fact Sheet for Healthcare Providers: GravelBags.it This test is not yet approved or cleared by the [place] FDA and  has been authorized for detection and/or diagnosis of SARS-CoV-2 by  FDA under an Emergency Use Authorization (EUA). This EUA will remain  in effect (meaning this test can be used) for the duration of the  Covid-19 declaration under Section 564(b)(1) of the Act, 21  U.S.C. section 360bbb-3(b)(1), unless the authorization is  terminated or revoked. Performed at [facility], [place] [address], [place],  [address]      Time coordinating discharge: 35  minutes  SIGNED:   Jonnie Finner, DO  [facility] 06/01/2019, 12:49 PM   If 7PM-7AM, please contact night-coverage [email or web address]

## 2019-06-03 ENCOUNTER — Telehealth (INDEPENDENT_AMBULATORY_CARE_PROVIDER_SITE_OTHER): Payer: Medicare Other | Admitting: Family

## 2019-06-03 ENCOUNTER — Telehealth: Payer: Self-pay | Admitting: Gastroenterology

## 2019-06-03 ENCOUNTER — Other Ambulatory Visit: Payer: Self-pay | Admitting: *Deleted

## 2019-06-03 ENCOUNTER — Encounter: Payer: Self-pay | Admitting: Family

## 2019-06-03 ENCOUNTER — Other Ambulatory Visit: Payer: Self-pay | Admitting: Interventional Radiology

## 2019-06-03 ENCOUNTER — Other Ambulatory Visit: Payer: Self-pay

## 2019-06-03 ENCOUNTER — Ambulatory Visit: Payer: Medicare Other | Admitting: Internal Medicine

## 2019-06-03 DIAGNOSIS — D696 Thrombocytopenia, unspecified: Secondary | ICD-10-CM

## 2019-06-03 DIAGNOSIS — R5381 Other malaise: Secondary | ICD-10-CM

## 2019-06-03 DIAGNOSIS — R160 Hepatomegaly, not elsewhere classified: Secondary | ICD-10-CM | POA: Diagnosis not present

## 2019-06-03 DIAGNOSIS — I25118 Atherosclerotic heart disease of native coronary artery with other forms of angina pectoris: Secondary | ICD-10-CM | POA: Diagnosis not present

## 2019-06-03 DIAGNOSIS — I8501 Esophageal varices with bleeding: Secondary | ICD-10-CM

## 2019-06-03 DIAGNOSIS — E119 Type 2 diabetes mellitus without complications: Secondary | ICD-10-CM | POA: Diagnosis not present

## 2019-06-03 DIAGNOSIS — K7460 Unspecified cirrhosis of liver: Secondary | ICD-10-CM | POA: Diagnosis not present

## 2019-06-03 DIAGNOSIS — I81 Portal vein thrombosis: Secondary | ICD-10-CM | POA: Diagnosis not present

## 2019-06-03 DIAGNOSIS — Z794 Long term (current) use of insulin: Secondary | ICD-10-CM | POA: Diagnosis not present

## 2019-06-03 DIAGNOSIS — R188 Other ascites: Secondary | ICD-10-CM

## 2019-06-03 DIAGNOSIS — E43 Unspecified severe protein-calorie malnutrition: Secondary | ICD-10-CM

## 2019-06-03 DIAGNOSIS — I2510 Atherosclerotic heart disease of native coronary artery without angina pectoris: Secondary | ICD-10-CM | POA: Diagnosis not present

## 2019-06-03 DIAGNOSIS — K3189 Other diseases of stomach and duodenum: Secondary | ICD-10-CM | POA: Diagnosis not present

## 2019-06-03 DIAGNOSIS — K7469 Other cirrhosis of liver: Secondary | ICD-10-CM | POA: Diagnosis not present

## 2019-06-03 DIAGNOSIS — K7581 Nonalcoholic steatohepatitis (NASH): Secondary | ICD-10-CM | POA: Diagnosis not present

## 2019-06-03 LAB — PTH-RELATED PEPTIDE: PTH-related peptide: <2 pmol/L

## 2019-06-03 MED ORDER — SPIRONOLACTONE 100 MG PO TABS: 100.0000 mg | ORAL_TABLET | Freq: Every day | ORAL | 3 refills | Status: DC

## 2019-06-03 MED ORDER — UNABLE TO FIND: 0 refills | Status: DC

## 2019-06-03 MED ORDER — FUROSEMIDE 40 MG PO TABS: 40.0000 mg | ORAL_TABLET | Freq: Every day | ORAL | 3 refills | Status: DC

## 2019-06-03 NOTE — Telephone Encounter (Signed)
Sorry just saw this - I think we do just need to check how much

## 2019-06-03 NOTE — Patient Outreach (Signed)
[facility] ([facility] Care Management  06/03/2019  [name] 05/26/1945 [id number]   Call placed to member/son (Chris) to follow up on recent discharge, recently admitted to hospital for hypercalcemia and hepatic encephalopathy.  Son report they are currently on hold for a virtual visit with PCP, will call back once visit is complete.   Update:  Call received back from Chris, state they still have not had visit with MD, still on hold for virtual visit.  He report member is improving, denies any confusion or nausea/vomiting.  They report financial strain with paying for Rifaximin, samples requested for MD office.  Aware that this care manager will have pharmacist contact her regarding medication assistance program.  State her appetite has been good but it seems as if she is starting to retain fluid again.  They are monitoring her weights and noted that it has increased.  He report after her TIPS procedure, her Lasix dose was increased but then she became dehydrated.  She has remained off her Lasix, will ask PCP about restarting it.  She will have more scans for her liver on 2/4, will follow up with GI on 2/9 for results.  Confirms that she will continue to have home health PT.  Denies any concerns at this time, report he and his siblings have been rotating to help with her care.  Will follow up within the next 2 weeks.    [facility] CM Care Plan Problem One     Most Recent Value  Care Plan Problem One  Risk for readmission related to bleeding varicies as evidenced by Recent hospitalizations  Role Documenting the Problem One  Care Management Coordinator  Care Plan for Problem One  Active  [facility] Long Term Goal   Member will not be readmitted to hospital within the next 31 days  [facility] Long Term Goal Start Date  06/03/19 [Not met, goal reset]  Interventions for Problem One Long Term Goal  Most recent discharge orders reviewed with son, adivsed of plan of care and importance of following (diet,  medication management, and follow up appointments)  [facility] CM Short Term Goal #1   Member will report taking medicatiosn as prescribed over the next 2 weeks  [facility] CM Short Term Goal #1 Start Date  05/20/19  Interventions for Short Term Goal #1  Referral placed to [facility] pharmacy for medication assistance, medication changes reviewed with son  [facility] CM Short Term Goal #2   Member will report increased strength over the next 2 weeks  [facility] CM Short Term Goal #2 Start Date  06/03/19 [Date reset]  Interventions for Short Term Goal #2  Confirmed with son that home health has contacted member and will restart visits. Advised of importance of cooperation with therapy to increase strength      Lane, RN, MSN [facility] Care Management  Community Care Manager [phone]  

## 2019-06-03 NOTE — Progress Notes (Signed)
Patient ID: [name], female   DOB: 04/30/1946, 74 y.o.   MRN: [id number] This service is provided via telemedicine  No vital signs collected/recorded due to the encounter was a telemedicine visit.   Location of patient (ex: home, work):  HOME  Patient consents to a telephone visit:  YES  Location of the provider (ex: office, home):  OFFICE  Name of any referring provider:  TIFFANY REED, DO  Names of all persons participating in the telemedicine service and their role in the encounter:  PATIENT, Edwin Dada, [place], Marlowe Sax, NP  Time spent on call:  10  Provider: Ademola Vert FNP-C  Gayland Curry, DO  Patient Care Team: Gayland Curry, DO as PCP - General (Geriatric Medicine) Berle Mull, MD as Consulting Physician (Family Medicine) Sharmon Revere as Physician Assistant (Cardiology) Calvert Cantor, MD as Consulting Physician (Ophthalmology) [name], MD as Attending Physician (Gastroenterology) [name], RN as [place] Management  Extended Emergency Contact Information Primary Emergency Contact: Blase Mess Address: [address]          [place], [place] [address] Johnnette Litter of [place] Phone: [phone] Mobile Phone: [phone] Relation: Spouse Secondary Emergency Contact: Sandie Ano Address: [address]          [place],  [address] Johnnette Litter of [place] Phone: [phone] Mobile Phone: [phone] Relation: Son  Code Status:  Full Code  Goals of care: Advanced Directive information Advanced Directives 05/16/2019  Does Patient Have a Medical Advance Directive? Yes  Type of Advance Directive Living will  Does patient want to make changes to medical advance directive? No - Patient declined  Would patient like information on creating a medical advance directive? -     Chief Complaint  Patient presents with  . Acute Visit    EDEMA, EXTREME WEIGHT GAIN    HPI:  Pt is a 74 y.o.  female seen today for an acute visit for evaluation of edema and weight gain x 2 days.she is status post hospitalization from 05/26/2019 - 06/01/2019 for hypercalcemia,dehydration,generalized weakness and fatigue.she has a medical history Type 2 DM,CAD,nonAlcohol liver cirrhosis of Calcium on admission was 13.4 thought to be due to multiple calcium supplements and vit D prior to admission and AMS.PTH-related peptide < 2.0; PTH 9; Calcium total (PTH) 13.4 she improved with IVF.Also had hyponatremia,hypophosphatemia and hypokalemia which resolved. She was continued on home magnesium supplement for hypomagnesemia.she had AKI on CKD CR 1.51 > 1.44 His Platelets 133 > 79 > 82 suspected due to liver disease.No active bleeding reported.Liver doppler done.status post TIPS for bleeding esophageal varices.IR ordered for the evaluation of TIPS patency/size shut noted to be widely patent with normal velocities. Right liver lobe mass noted on doppler.MRI done for follow up liver mass showed right liver lobe 3.0 cm mass mixed.Mass undetermined for neoplasm however findings favor a biloma Infected biloma cannot be excluded.Percutaneous biopsy suggested.Outpatient MRI follow up W/Wo I.V contrast recommended in 3 months.Medical Oncology was consulted at the hospital will follow up outpatient.she was also advised to follow up with her Gastroenterology.  She had soft low blood pressure and her diuretics were held. She was discharged with Home health Physical Therapy and HH RN due to severe debility and deconditioning.Advised to continue on boost supplement and Nutrition supplement.  She states appetite has slightly improved.Still feels weak.she ambulates with walker to the bathroom and back to bed.Reports 30 lbs weight gain since 06/01/2019 after hospital discharge.she as shortness of breath  with lying down flat in bed.she is off her diuretic and Aldactone since it was stopped during hospitalization.POA states still has full bottles  of medication.    Past Medical History:  Diagnosis Date  . Allergy   . Arthritis    neck  . Cataract    bilateral - MD monitoring cataracts  . CHF (congestive heart failure) ([place])   . Chronic kidney disease, stage I    DR OTTELIN  HX UTIS  . Cirrhosis ([place])   . Cramp of limb   . Diabetes mellitus   . Dysphagia, unspecified(787.20)   . Dysuria   . Epistaxis   . GERD (gastroesophageal reflux disease)   . Heart murmur    NO CARDIOLOGIST  DX FOR YEARS ASYMPTOMATIC  . Hepatic encephalopathy ([place])   . Lumbago   . Neoplasm of uncertain behavior of skin   . Nonspecific elevation of levels of transaminase or lactic acid dehydrogenase (LDH)   . Osteoarthrosis, unspecified whether generalized or localized, unspecified site   . Other and unspecified hyperlipidemia    diet controlled  . Pain in joint, shoulder region   . Paresthesias 04/01/2015  . Postablative ovarian failure   . Trochanteric bursitis of left hip 12/15/2015  . Type 2 diabetes mellitus without complication ([place])   . Unspecified essential hypertension    no meds   Past Surgical History:  Procedure Laterality Date  . BREAST BIOPSY    . CARDIAC CATHETERIZATION N/A 01/27/2016   Procedure: Left Heart Cath and Coronary Angiography;  Surgeon: Belva Crome, MD;  Location: [facility];  Service: Cardiovascular;  Laterality: N/A;  . COLONOSCOPY  2012   Dr Lajoyce Corners.   . COLONOSCOPY WITH PROPOFOL N/A 07/07/2016   Procedure: COLONOSCOPY WITH PROPOFOL;  Surgeon: [name], MD;  Location: [facility];  Service: Endoscopy;  Laterality: N/A;  . CORONARY ARTERY BYPASS GRAFT N/A 01/28/2016   Procedure: CORONARY ARTERY BYPASS GRAFTING (CABG) x 3 USING RIGHT LEG GREATER SAPHENOUS VEIN GRAFT;  Surgeon: Melrose Nakayama, MD;  Location: [facility];  Service: Open Heart Surgery;  Laterality: N/A;  . ENDOVEIN HARVEST OF GREATER SAPHENOUS VEIN Right 01/28/2016   Procedure: ENDOVEIN HARVEST OF GREATER SAPHENOUS VEIN;  Surgeon: Melrose Nakayama, MD;  Location: [facility];  Service: Open Heart Surgery;  Laterality: Right;  . ESOPHAGEAL BANDING  03/28/2019   Procedure: ESOPHAGEAL BANDING;  Surgeon: [name], MD;  Location: [facility];  Service: Endoscopy;;  . ESOPHAGEAL BANDING  04/07/2019   Procedure: ESOPHAGEAL BANDING;  Surgeon: Juanita Craver, MD;  [facility] ENDOSCOPY;  Service: Endoscopy;;  . ESOPHAGOGASTRODUODENOSCOPY N/A 04/07/2019   Procedure: ESOPHAGOGASTRODUODENOSCOPY (EGD);  Surgeon: Juanita Craver, MD;  Location: [facility] ENDOSCOPY;  Service: Endoscopy;  Laterality: N/A;  . ESOPHAGOGASTRODUODENOSCOPY (EGD) WITH PROPOFOL N/A 07/07/2016   Procedure: ESOPHAGOGASTRODUODENOSCOPY (EGD) WITH PROPOFOL;  Surgeon: [name], MD;  Location: [facility];  Service: Endoscopy;  Laterality: N/A;  . ESOPHAGOGASTRODUODENOSCOPY (EGD) WITH PROPOFOL N/A 03/28/2019   Procedure: ESOPHAGOGASTRODUODENOSCOPY (EGD) WITH PROPOFOL;  Surgeon: [name], MD;  Location: [facility];  Service: Endoscopy;  Laterality: N/A;  . HEMOSTASIS CLIP PLACEMENT  04/07/2019   Procedure: HEMOSTASIS CLIP PLACEMENT;  Surgeon: Juanita Craver, MD;  Location: [facility] ENDOSCOPY;  Service: Endoscopy;;  . IR ANGIOGRAM SELECTIVE EACH ADDITIONAL VESSEL  04/08/2019  . IR EMBO ART  VEN HEMORR LYMPH EXTRAV  INC GUIDE ROADMAPPING  04/08/2019  . IR PARACENTESIS  04/08/2019  . IR TIPS  04/08/2019  . MAXIMUM ACCESS (  MAS)POSTERIOR LUMBAR INTERBODY FUSION (PLIF) 1 LEVEL Left 06/10/2015   Procedure: FOR MAXIMUM ACCESS (MAS) POSTERIOR LUMBAR INTERBODY FUSION (PLIF) LUMBAR THREE-FOUR EXTRAFORAMINAL MICRODISCECTOMY LUMBAR FIVE-SACRAL ONE LEFT;  Surgeon: Eustace Moore, MD;  Location: [facility] NEURO ORS;  Service: Neurosurgery;  Laterality: Left;  . RADIOLOGY WITH ANESTHESIA N/A 04/08/2019   Procedure: RADIOLOGY WITH ANESTHESIA;  Surgeon: Radiologist, Medication, MD;  Location: [facility];  Service: Radiology;  Laterality: N/A;  . SCLEROTHERAPY  04/07/2019   Procedure: SCLEROTHERAPY;   Surgeon: Juanita Craver, MD;  Location: [facility] ENDOSCOPY;  Service: Endoscopy;;  . TEE WITHOUT CARDIOVERSION N/A 01/28/2016   Procedure: TRANSESOPHAGEAL ECHOCARDIOGRAM (TEE);  Surgeon: Melrose Nakayama, MD;  Location: [facility];  Service: Open Heart Surgery;  Laterality: N/A;  . TUBAL LIGATION  1982   Dr Connye Burkitt  . UPPER GASTROINTESTINAL ENDOSCOPY    . VAGINAL HYSTERECTOMY  1997   Dr Rande Lawman    Allergies  Allergen Reactions  . Kiwi Extract Anaphylaxis  . Tdap [Tetanus-Diphth-Acell Pertussis] Swelling and Other (See Comments)    Swelling at injection site, gets very hot  . Statins Other (See Comments)    RHABDOMYOLYSIS  . Latex Itching, Dermatitis and Rash  . Tramadol Nausea And Vomiting    Outpatient Encounter Medications as of 06/03/2019  Medication Sig  . acetaminophen (TYLENOL) 500 MG tablet Take 500 mg by mouth at bedtime.   . BD PEN NEEDLE NANO U/F 32G X 4 MM MISC USE THREE TIMES DAILY AS DIRECTED  . Biotin 10000 MCG TABS Take 10,000 mcg by mouth every morning.  . Cyanocobalamin (VITAMIN B 12 PO) Take 1,000 mcg by mouth daily.    [name] Kitchen ezetimibe (ZETIA) 10 MG tablet TAKE 1 TABLET(10 MG) BY MOUTH DAILY  . glucose blood test strip One Touch Ultra II strips. Use to test blood sugar three times daily. Dx: E11.65  . insulin detemir (LEVEMIR) 100 UNIT/ML injection Inject 0.1 mLs (10 Units total) into the skin at bedtime.  . Insulin Syringe-Needle U-100 (INSULIN SYRINGE 1CC/31GX5/16") 31G X 5/16" 1 ML MISC USE AS DIRECTED DAILY WITH LEVEMIR  . JARDIANCE 25 MG TABS tablet Take 25 mg by mouth daily.  [name] Kitchen lactulose (CHRONULAC) 10 GM/15ML solution Take 45 mLs (30 g total) by mouth 4 (four) times daily.  [name] Kitchen loratadine (CLARITIN) 10 MG tablet Take 10 mg by mouth daily as needed for allergies.  [name] Kitchen MAGNESIUM PO Take 500 mg by mouth daily.   . Multiple Vitamins-Minerals (MULTIVITAMIN WITH MINERALS) tablet Take 1 tablet by mouth daily.    [name] Kitchen NOVOLOG FLEXPEN 100 UNIT/ML FlexPen Inject 5 units with breakfast,  lunch, and dinner.  . ondansetron (ZOFRAN) 4 MG tablet Take 1 tablet (4 mg total) by mouth every 6 (six) hours as needed for nausea.  . pantoprazole (PROTONIX) 40 MG tablet Take 1 tablet (40 mg total) by mouth daily.  Vladimir Faster Glycol-Propyl Glycol (SYSTANE OP) Place 1 drop into both eyes daily.   . Probiotic Product (PROBIOTIC DAILY PO) Take 1 capsule by mouth daily. Digestive Advantage Probiotic  . rifaximin (XIFAXAN) 550 MG TABS tablet Take 1 tablet (550 mg total) by mouth 2 (two) times daily.  . [DISCONTINUED] Amino Acids-Protein Hydrolys (FEEDING SUPPLEMENT, PRO-STAT SUGAR FREE 64,) LIQD Take 30 mLs by mouth 3 (three) times daily with meals.  . [DISCONTINUED] Aromatic Inhalants (VICKS VAPOR IN) Vicks Vapor Rub apply small amount to outside of nose to help breathing  . [DISCONTINUED] bisacodyl (DULCOLAX) 10 MG suppository Place 1 suppository (10 mg total) rectally daily as  needed for moderate constipation.   No facility-administered encounter medications on file as of 06/03/2019.    Review of Systems  Constitutional: Negative for appetite change, chills, fatigue and fever.  HENT: Negative for congestion, rhinorrhea, sinus pressure, sinus pain, sneezing and sore throat.   Eyes: Negative for pain, discharge, redness and itching.  Respiratory: Negative for cough, chest tightness and wheezing.        Shortness of breath with lying down   Cardiovascular: Positive for leg swelling. Negative for chest pain and palpitations.  Gastrointestinal: Negative for abdominal distention, abdominal pain, constipation, diarrhea, nausea and vomiting.  Endocrine: Negative for cold intolerance, heat intolerance, polydipsia, polyphagia and polyuria.  Genitourinary: Negative for difficulty urinating, dysuria, flank pain, frequency and urgency.  Musculoskeletal: Positive for gait problem. Negative for arthralgias.  Skin: Negative for color change, pallor and rash.  Neurological: Negative for dizziness, speech  difficulty, light-headedness, numbness and headaches.  Hematological: Does not bruise/bleed easily.  Psychiatric/Behavioral: Negative for agitation, confusion and sleep disturbance. The patient is not nervous/anxious.     Immunization History  Administered Date(s) Administered  . Fluad Quad(high Dose 65+) 01/31/2019  . Hep A / Hep B 06/24/2016, 07/04/2016, 07/21/2016  . Influenza Whole 02/28/2012  . Influenza, High Dose Seasonal PF 01/26/2017, 01/29/2018  . Influenza,inj,Quad PF,6+ Mos 03/27/2013, 01/16/2014, 03/31/2015, 02/26/2016  . Pneumococcal Conjugate-13 06/28/2011  . Pneumococcal Polysaccharide-23 04/19/2016  . Td 01/12/1999   Pertinent  Health Maintenance Due  Topic Date Due  . OPHTHALMOLOGY EXAM  08/24/2018  . HEMOGLOBIN A1C  09/29/2019  . FOOT EXAM  10/04/2019  . URINE MICROALBUMIN  01/31/2020  . MAMMOGRAM  09/25/2020  . COLONOSCOPY  07/07/2021  . INFLUENZA VACCINE  Completed  . DEXA SCAN  Completed  . PNA vac Low Risk Adult  Completed   Fall Risk  05/13/2019 04/19/2019 04/15/2019 10/04/2018 06/04/2018  Falls in the past year? 0 0 0 0 0  Number falls in past yr: 0 0 - 0 0  Injury with Fall? 0 0 - 0 0   There were no vitals filed for this visit. There is no height or weight on file to calculate BMI. Physical Exam Constitutional:      General: She is not in acute distress.    Appearance: She is not ill-appearing or toxic-appearing.  HENT:     Head: Normocephalic.  Eyes:     General:        Right eye: No discharge.        Left eye: No discharge.     Comments: Bilateral lower eyelid edema   Pulmonary:     Effort: Pulmonary effort is normal. No respiratory distress.  Musculoskeletal:     Right lower leg: Edema present.     Left lower leg: Edema present.  Neurological:     Mental Status: She is alert and oriented to person, place, and time.     Gait: Gait abnormal.     Comments: Generalized weakness   Psychiatric:        Mood and Affect: Mood normal.         Behavior: Behavior normal.        Thought Content: Thought content normal.        Judgment: Judgment normal.     Labs reviewed: Recent Labs    05/30/19 0424 05/31/19 0500 06/01/19 0239  NA 139 137 140  K 4.0 3.6 3.6  CL 111 110 116*  CO2 20* 19* 18*  GLUCOSE 192* 239* 239*  BUN 13  15 12  CREATININE 0.88 0.93 0.82  CALCIUM 9.6 9.1 9.1  MG 2.0 1.9 1.6*  PHOS 2.0* 3.3 3.5   Recent Labs    05/16/19 0719 05/16/19 0719 05/26/19 1332 05/26/19 1332 05/27/19 0352 05/28/19 0257 05/30/19 0424 05/31/19 0500 06/01/19 0239  AST 58*  --  60*  --  53*  --   --   --   --   ALT 43  --  45*  --  36  --   --   --   --   ALKPHOS 104  --  190*  --  148*  --   --   --   --   BILITOT 4.2*  --  3.1*  --  2.2*  --   --   --   --   PROT 6.6  --  7.4  --  5.8*  --   --   --   --   ALBUMIN 2.6*   < > 2.8*   < > 2.2*   < > 2.1* 2.0* 1.9*   < > = values in this interval not displayed.   Recent Labs    05/30/19 0424 05/31/19 0500 06/01/19 0239  WBC 3.9* 3.5* 3.9*  NEUTROABS 2.1 1.8 2.2  HGB 12.9 12.4 12.2  HCT 38.8 36.3 35.4*  MCV 99.2 98.9 97.3  PLT 76* 77* 82*   Lab Results  Component Value Date   TSH 2.150 06/23/2014   Lab Results  Component Value Date   HGBA1C 8.4 (H) 04/01/2019   Lab Results  Component Value Date   CHOL 147 05/24/2018   HDL 53 05/24/2018   LDLCALC 80 05/24/2018   TRIG 64 05/24/2018   CHOLHDL 2.8 05/24/2018    Significant Diagnostic Results in last 30 days:  CT Head Wo Contrast  Result Date: 05/15/2019 CLINICAL DATA:  Altered mental status.  Confusion. EXAM: CT HEAD WITHOUT CONTRAST TECHNIQUE: Contiguous axial images were obtained from the base of the skull through the vertex without intravenous contrast. COMPARISON:  None. FINDINGS: Brain: The ventricles and sulci appear normal in size and contour. There is no intracranial mass, hemorrhage, extra-axial fluid collection, or midline shift. There is mild small vessel disease in the centra semiovale  bilaterally. No acute appearing infarct is evident on this study. Vascular: There is no hyperdense vessel. There is calcification in each carotid siphon region. Skull: The bony calvarium appears intact. Sinuses/Orbits: There is mucosal thickening in several ethmoid air cells. Other visualized paranasal sinuses are clear. Orbits appear symmetric bilaterally. Other: Mastoid air cells are clear. IMPRESSION: Mild periventricular small vessel disease. No acute infarct. No mass or hemorrhage. There are foci of arterial vascular calcification. There is mucosal thickening in several ethmoid air cells. Electronically Signed   By: Lowella Grip III M.D.   On: 05/15/2019 11:37   MR LIVER W WO CONTRAST  Result Date: 05/30/2019 CLINICAL DATA:  Inpatient. Cirrhosis, admitted with a patent encephalopathy. History of TIPS placed 04/08/2019. Right liver mass on recent ultrasound. EXAM: MRI ABDOMEN WITHOUT AND WITH CONTRAST TECHNIQUE: Multiplanar multisequence MR imaging of the abdomen was performed both before and after the administration of intravenous contrast. CONTRAST:  78m GADAVIST GADOBUTROL 1 MMOL/ML IV SOLN COMPARISON:  05/29/2019 liver Doppler scan. 03/29/2019 CT abdomen/pelvis. 04/07/2017 MRI abdomen. FINDINGS: Scan significantly limited by motion degradation, particularly on the postcontrast sequences. Lower chest: Trace dependent bilateral pleural effusions. Hepatobiliary: Diffusely irregular liver surface compatible with cirrhosis. No hepatic steatosis. There is a 3.0 x 2.9 cm  right liver lobe masslike focus (series 5/image 8) with heterogeneous mild T2 hyperintensity, restricted diffusion, mixed T1 signal with peripheral T1 hyperintensity and no compelling evidence of enhancement on the substantially motion degraded postcontrast sequences, located just lateral to the midportion of the TIPS and new since 04/06/2019 CT. No additional liver lesions. Numerous subcentimeter gallstones layering in the mildly  distended gallbladder. Mild diffuse gallbladder wall thickening. No biliary ductal dilatation. Common bile duct diameter 6 mm. No appreciable choledocholithiasis, biliary strictures, biliary masses or beading on the motion degraded sequences. Pancreas: No pancreatic mass or duct dilation. Spleen: Normal size. No mass. Adrenals/Urinary Tract: Normal adrenals. No hydronephrosis. Normal kidneys with no renal mass. Stomach/Bowel: Normal non-distended stomach. Visualized small and large bowel is normal caliber, with no definite bowel wall thickening. Vascular/Lymphatic: Normal caliber abdominal aorta. Patent splenic and renal veins. Well-positioned TIPS extending proximally from the main portal vein to the junction of the right hepatic vein and IVC, which appears patent on the limited motion degraded postcontrast sequences. Moderate paraumbilical and gastrosplenic varices. Small lower esophageal varices. No pathologically enlarged lymph nodes in the abdomen. Other: Small volume ascites. Musculoskeletal: No aggressive appearing focal osseous lesions. Bilateral posterior spinal fusion hardware in the lower lumbar spine. IMPRESSION: 1. Limited motion degraded scan. 2. Cirrhosis. Right liver lobe 3.0 cm mass with mixed signal intensity, restricted diffusion and no compelling evidence of enhancement on this motion degraded scan, located just lateral to the midportion of the TIPS, new since most recent comparison study (04/06/2019 CT). This mass remains indeterminate for neoplasm on the basis of this limited MRI study, however findings favor a biloma. Infected biloma cannot be excluded. Percutaneous biopsy could be considered. Short-term outpatient follow-up MRI abdomen without and with IV contrast recommended in 3 months. 3. TIPS appears well positioned and patent. 4. Small volume ascites. 5. Small lower esophageal, moderate paraumbilical and moderate gastrosplenic varices. 6. Trace dependent bilateral pleural effusions.  Electronically Signed   By: Ilona Sorrel M.D.   On: 05/30/2019 20:15   DG Chest Port 1 View  Result Date: 05/26/2019 CLINICAL DATA:  Nausea, vomiting and weakness since last Tuesday. EXAM: PORTABLE CHEST 1 VIEW COMPARISON:  Chest x-ray 05/17/2019 FINDINGS: The cardiac silhouette, mediastinal and hilar contours are within normal limits. Mild tortuosity and calcification of the thoracic aorta. Stable surgical changes from bypass surgery. The lungs are clear of an acute process. No pleural effusions or pulmonary lesions. The bony thorax is intact. IMPRESSION: No acute cardiopulmonary findings. Electronically Signed   By: Marijo Sanes M.D.   On: 05/26/2019 16:02   DG CHEST PORT 1 VIEW  Result Date: 05/17/2019 CLINICAL DATA:  Mild cough EXAM: PORTABLE CHEST 1 VIEW COMPARISON:  05/15/2019 FINDINGS: Cardiac shadow is stable. Postsurgical changes are again seen. Tip shunt is noted in the upper abdomen. The lungs are well aerated without focal infiltrate or sizable effusion. No bony abnormality is noted. IMPRESSION: No acute abnormality seen. Electronically Signed   By: Inez Catalina M.D.   On: 05/17/2019 20:25   DG Chest Portable 1 View  Result Date: 05/15/2019 CLINICAL DATA:  Altered mental status EXAM: PORTABLE CHEST 1 VIEW COMPARISON:  04/01/2019 FINDINGS: Prior CABG. Heart is normal size. Lungs are clear. No effusions. No acute bony abnormality. IMPRESSION: No active disease. Electronically Signed   By: Rolm Baptise M.D.   On: 05/15/2019 10:46   [place] ASCITES (ABDOMEN LIMITED)  Result Date: 05/27/2019 CLINICAL DATA:  Cirrhosis of the liver. EXAM: LIMITED ABDOMEN ULTRASOUND FOR ASCITES TECHNIQUE: Limited  ultrasound survey for ascites was performed in all four abdominal quadrants. COMPARISON:  Abdomen and pelvis CT dated 04/06/2019. FINDINGS: Sonographic survey of the 4 quadrants of the abdomen and midline abdomen demonstrated no sonographically visible free peritoneal fluid. IMPRESSION: No free peritoneal  fluid. Electronically Signed   By: Claudie Revering M.D.   On: 05/27/2019 16:50   US LIVER DOPPLER  Result Date: 05/29/2019 CLINICAL DATA:  History of TIPS placement on 04/08/2019 to treat bleeding esophageal varices. History of cirrhosis and portal hypertension. Admission currently for treatment of hepatic encephalopathy. Assessment of TIPS patency and internal velocities. EXAM: DUPLEX ULTRASOUND OF LIVER AND TIPS SHUNT TECHNIQUE: Color and duplex Doppler ultrasound was performed to evaluate the hepatic in-flow and out-flow vessels. COMPARISON:  TIPS procedure on 04/08/2019 and CT of the abdomen on 04/06/2019. FINDINGS: Portal Vein Velocities Main:  79 cm/sec No evidence of portal vein thrombus. Hepatopetal flow towards the liver. TIPS Stent Velocities Proximal:  177 cm/sec Mid:  129 Distal:  201 cm/sec No visible stenosis or thrombus. IVC: Patent.  Resent and patent with normal respiratory phasicity. Hepatic Vein Velocities Right:  Not able to be sample due to overlying TIPS shunt. Mid:  45 cm/sec Left:  31 cm/sec Splenic Vein: 49 Superior Mesenteric Vein: Not visualized. Hepatic Artery: 149 Ascites: Small amount of ascites visualized adjacent to the liver. Varices: None visualized. Other findings: Within the liver, a focal heterogeneous rounded area is noted in the right lobe measuring roughly 3.6 x 3.8 x 2.8 cm. This is suspicious for a hepatic mass. No evidence of biliary ductal dilatation. The liver demonstrates morphology consistent with cirrhosis. IMPRESSION: 1. Widely patent TIPS shunt with normal velocities. 2. Mass-like area in the right lobe of the liver suspicious for malignancy measuring up to approximately 3.8 cm in diameter. Recommend correlation with serum AFP level and further imaging correlation with MRI of the abdomen with and without gadolinium. When the patient is clinically stable and able to follow directions and hold their breath (preferably as an outpatient) further evaluation with  dedicated abdominal MRI should be considered. Electronically Signed   By: Aletta Edouard M.D.   On: 05/29/2019 11:07    Assessment/Plan 1. Cirrhosis of liver with ascites, unspecified hepatic cirrhosis type ([place]) Reports 30 lbs weight gain since hospital discharge 06/01/2019 with shortness of breath with lying down and lower extremities edema.current off Furosemide and Aldactone discontinued at the hospital for possible dehydration and AKI.Will restart Furosemide 40 mg tablet daily and Aldactone 100 mg tablet daily( previously on 200 mg tablet).will advance Aldactone to 200 as tolerated. - Keep legs elevated when seated  - Avoid adding extra salt to diet and no sodas. - check weight daily and record.Notify provider for any abrupt weight gain 3 pounds in one day  - check blood pressure daily and record.Do not take furosemide and Aldactone if blood pressure is less than 100/60 and notify provider's office.  - CBC with Differential/Platelet; Future - CMP with eGFR([facility]); Future - UNABLE TO FIND; Med [facility]: [facility] to draw lab work in 1 week: CBC/diff and CMP  Dispense: 1 Act; Refill: 0 - continue on rifaximin 550 mg tablet twice daily and Lactulose 30 g four times daily. - Continue to follow up with Gastroenterology.   2. Thrombocytopenia (HCC) PLTs 133 > 79> 82 suspected due to her liver cirrhosis  No active bleeding noted.will monitor CBC/diff - UNABLE TO FIND; Med Name: [facility] to draw lab work in 1 week: CBC/diff  and CMP  Dispense: 1 Act; Refill: 0  3. Debility/Deconditioning  Continue with Home Health Physical therapy as ordered from Hospital.Per POA PT will be seeing patient later today.  - UNABLE TO FIND; Med Name: [facility] to draw lab work in 1 week: CBC/diff and CMP  Dispense: 1 Act; Refill: 0  4. Hypercalcemia Ca 13.4 > 11.3  -  CMP with eGFR([facility]); Future - UNABLE TO FIND; Med Name: [facility] to draw lab work in  1 week: CBC/diff and CMP  Dispense: 1 Act; Refill: 0  5. Liver mass, right lobe Noted on doppler and MRI 3.0 cm.follow MRI in 3 months recommended - follow up with outpatient oncologist as directed.   6. Severe protein-calorie malnutrition ([place]) Continue on protein supplement. -  CMP with eGFR([facility]); Future - UNABLE TO FIND; Med [facility]: [facility] to draw lab work in 1 week: CBC/diff and CMP  Dispense: 1 Act; Refill: 0  7. Type 2 diabetes mellitus without complication, with long-term current use of insulin (HCC) Lab Results  Component Value Date   HGBA1C 8.4 (H) 04/01/2019  Continue on levemir 10 units daily at bedtime,Jardiance 25 mg tablet daily and Novolog 5 units three times with meals.  Family/ staff Communication: Reviewed plan of care with patient and POA   Labs/tests ordered:  - CBC with Differential/Platelet; Future - CMP with eGFR([facility]); Future  Next Appointment: 1 week for follow up edema and weight gain.  Spent 15 minutes of face to face on video with patient   Sandrea Hughs, NP

## 2019-06-03 NOTE — Telephone Encounter (Signed)
Magda Paganini do we have samples??

## 2019-06-03 NOTE — Patient Instructions (Signed)
-  Restart Furosemide 40 mg tablet one by mouth daily  - Aldactone 100 mg tablet one by mouth daily  - Home health Nurse to draw blood work in 1 week will fax orders to [place] legs elevated when seated  - Avoid adding extra salt to diet and no sodas. - check weight daily and record.Notify provider for any abrupt weight gain 3 pounds in one day  - check blood pressure daily and record.Do not take furosemide and Aldactone if blood pressure is less than 100/60 and notify provider's office.

## 2019-06-04 ENCOUNTER — Telehealth: Payer: Self-pay | Admitting: Family

## 2019-06-04 DIAGNOSIS — R188 Other ascites: Secondary | ICD-10-CM

## 2019-06-04 DIAGNOSIS — K7460 Unspecified cirrhosis of liver: Secondary | ICD-10-CM

## 2019-06-04 DIAGNOSIS — E43 Unspecified severe protein-calorie malnutrition: Secondary | ICD-10-CM

## 2019-06-04 DIAGNOSIS — K7469 Other cirrhosis of liver: Secondary | ICD-10-CM

## 2019-06-04 DIAGNOSIS — R5381 Other malaise: Secondary | ICD-10-CM

## 2019-06-04 DIAGNOSIS — D696 Thrombocytopenia, unspecified: Secondary | ICD-10-CM

## 2019-06-04 NOTE — Telephone Encounter (Signed)
Please fax lab orders to [facility] to draw labs CBC/diff and CMP in one week. Also schedule patient for a follow up video visit for edema and weight gain in one week.

## 2019-06-04 NOTE — Telephone Encounter (Signed)
Left message on machine to call back  

## 2019-06-05 ENCOUNTER — Telehealth: Payer: Self-pay | Admitting: Pharmacist

## 2019-06-05 ENCOUNTER — Telehealth: Payer: Self-pay | Admitting: Hematology

## 2019-06-05 ENCOUNTER — Ambulatory Visit: Payer: Self-pay | Admitting: *Deleted

## 2019-06-05 MED ORDER — RIFAXIMIN 550 MG PO TABS: 550.0000 mg | ORAL_TABLET | Freq: Two times a day (BID) | ORAL | 1 refills | Status: DC

## 2019-06-05 NOTE — Telephone Encounter (Signed)
Scheduled apt per 1/22 sch message - pt is aware of appt date and time

## 2019-06-05 NOTE — Telephone Encounter (Signed)
-----   Message from Jiles Harold sent at 06/05/2019  8:37 AM EST ----- Regarding: Referral: Order for Judieth Keens  Referral from [name], RN  "Please see below request"  Forde Radon ----- Message ----- From: [name], RN Sent: 06/04/2019   8:30 PM EST To: Thn Cm Communication Orders Subject: Order for [facility]                     Patient Name: [name], [name] X([id number]) Sex: Female DOB: 29-Dec-1945    PCP: REED, [place]: [place]   Types of orders made on 06/04/2019: Nursing  Order Date:06/04/2019 Ordering [place], MONICA [[id number]] Encounter Provider:Lane, [place], RN [[id number]] Authorizing Pr ovider: Gayland Curry, DO [[id number]] Department:[facility][[id number]]  Order Specific Information Order: Comm to Pharmacy [Custom: [id number]]  Order #: [id number] Qty: 1   Priority: Routine  Class: Clinic Performed     Reason for Consult -> Medication Assistance       Priority: Routine  Class: Clinic Performed    Scheduling Instructions:   Patient/son report having problems paying  for Rifaximin.  Son [name] is    contact person, [phone].     Reason for Consult -> Medication Assistance

## 2019-06-05 NOTE — Telephone Encounter (Signed)
The prescription has been sent to [facility] to get assistance for the pt.  The pt son is aware and will call if he has not heard from that office in 1 week.

## 2019-06-05 NOTE — Patient Outreach (Addendum)
[name]) Care Management  [place]   06/05/2019  [name] 1945-12-05 [id number]  Reason for referral: Medication Assistance  Referral source: [facility] RN Current insurance: [employer]  PMHx includes but not limited to:  Esophageal varices, CAD, type 2 diabetes, hypertension, hepatic encephalopathy, hypertension, GERD, CHF, hyperlipidemia, insomnia, cirrhosis of the liver, osteoporosis, and is in the process of having a mass reviewed with suspected hepatoma.  Patient was recently hospitalized for hepatic encephalopathy and weakness.   Outreach:  Successful telephone call with patient's son [name].  HIPAA identifiers verified.   Subjective:  Patient is a 74 year old female with the above mentioned medical conditions. Per referral instructions, I spoke with her son [name] today. [name] and his siblings are now handling the patient's medications. He said Xifaxan was going to cost her > $900 and that was/is cost prohibitive. Patient was given samples but she will be out of samples after today.    Objective: The 10-year ASCVD risk score [name], et al., 2013) is: 24.6%   Values used to calculate the score:     Age: 53 years     Sex: Female     Is Non-Hispanic African American: No     Diabetic: Yes     Tobacco smoker: No     Systolic Blood Pressure: 409 mmHg     Is BP treated: Yes     HDL Cholesterol: 53 mg/dL     Total Cholesterol: 147 mg/dL  Lab Results  Component Value Date   CREATININE 0.82 06/01/2019   CREATININE 0.93 05/31/2019   CREATININE 0.88 05/30/2019    Lab Results  Component Value Date   HGBA1C 8.4 (H) 04/01/2019    Lipid Panel     Component Value Date/Time   CHOL 147 05/24/2018 0857   CHOL 185 07/08/2016 1045   TRIG 64 05/24/2018 0857   HDL 53 05/24/2018 0857   HDL 55 07/08/2016 1045   CHOLHDL 2.8 05/24/2018 0857   VLDL 13 04/15/2016 1002   LDLCALC 80 05/24/2018 0857    BP Readings from Last 3 Encounters:  06/01/19 (!)  123/52  05/18/19 (!) 106/47  05/13/19 130/60    Allergies  Allergen Reactions  . Kiwi Extract Anaphylaxis  . Tdap [Tetanus-Diphth-Acell Pertussis] Swelling and Other (See Comments)    Swelling at injection site, gets very hot  . Statins Other (See Comments)    RHABDOMYOLYSIS  . Latex Itching, Dermatitis and Rash  . Tramadol Nausea And Vomiting    Medications Reviewed Today    Reviewed by Despina Hidden, CMA (Certified Medical Assistant) on 06/03/19 at [place] List Status: <None>  Medication Order Taking? Sig Documenting Provider Last Dose Status Informant  acetaminophen (TYLENOL) 500 MG tablet [id number] Yes Take 500 mg by mouth at bedtime.  [provider] Taking Active Multiple Informants        Discontinued 06/03/19 1533 (Patient Preference)         Discontinued 06/03/19 1533 (Completed Course)   BD PEN NEEDLE NANO U/F 32G X 4 MM MISC [id number] Yes USE THREE TIMES DAILY AS DIRECTED Hollace Kinnier L, DO Taking Active Multiple Informants  Biotin 10000 MCG TABS [id number] Yes Take 10,000 mcg by mouth every morning. [provider] Taking Active Multiple Informants        Discontinued 06/03/19 1533 (Patient Preference)   Cyanocobalamin (VITAMIN B 12 PO) [id number] Yes Take 1,000 mcg by mouth daily.   [provider] Taking Active Multiple Informants  ezetimibe (ZETIA) 10 MG tablet [id number] Yes TAKE 1 TABLET(10 MG) BY MOUTH DAILY Reed, Tiffany L, DO Taking Active Multiple Informants  glucose blood test strip [id number] Yes One Touch Ultra II strips. Use to test blood sugar three times daily. Dx: E11.65 Gayland Curry, DO Taking Active Multiple Informants  insulin detemir (LEVEMIR) 100 UNIT/ML injection [id number] Yes Inject 0.1 mLs (10 Units total) into the skin at bedtime. Cherylann Ratel A, DO Taking Active   Insulin Syringe-Needle U-100 (INSULIN SYRINGE 1CC/31GX5/16") 31G X 5/16" 1 ML MISC [id number] Yes USE AS DIRECTED DAILY WITH LEVEMIR Reed, Tiffany L, DO  Taking Active Multiple Informants  JARDIANCE 25 MG TABS tablet [id number] Yes Take 25 mg by mouth daily. [provider] Taking Active Multiple Informants  lactulose (CHRONULAC) 10 GM/15ML solution [id number] Yes Take 45 mLs (30 g total) by mouth 4 (four) times daily. Desiree Hane, MD Taking Active Multiple Informants  loratadine (CLARITIN) 10 MG tablet [id number] Yes Take 10 mg by mouth daily as needed for allergies. [provider] Taking Active Multiple Informants  MAGNESIUM PO [id number] Yes Take 500 mg by mouth daily.  [provider] Taking Active Multiple Informants  Multiple Vitamins-Minerals (MULTIVITAMIN WITH MINERALS) tablet [id number] Yes Take 1 tablet by mouth daily.   [provider] Taking Active Multiple Informants  NOVOLOG FLEXPEN 100 UNIT/ML FlexPen [id number] Yes Inject 5 units with breakfast, lunch, and dinner. Cherylann Ratel A, DO Taking Active   ondansetron (ZOFRAN) 4 MG tablet [id number] Yes Take 1 tablet (4 mg total) by mouth every 6 (six) hours as needed for nausea. Oretha Milch D, MD Taking Active Multiple Informants  pantoprazole (PROTONIX) 40 MG tablet [id number] Yes Take 1 tablet (40 mg total) by mouth daily. Gayland Curry, DO Taking Active Multiple Informants  Polyethyl Glycol-Propyl Glycol (SYSTANE OP) [id number] Yes Place 1 drop into both eyes daily.  [provider] Taking Active Multiple Informants  Probiotic Product (PROBIOTIC DAILY PO) [id number] Yes Take 1 capsule by mouth daily. [employer] Advantage Probiotic [provider] Taking Active Multiple Informants  rifaximin (XIFAXAN) 550 MG TABS tablet [id number] Yes Take 1 tablet (550 mg total) by mouth 2 (two) times daily. Desiree Hane, MD Taking Active Multiple Informants          Assessment: Drugs sorted by system:  Cardiovascular:  Ezetimibe, furosemide, spirolactone  Pulmonary/Allergy: loratadine  Gastrointestinal: Lactulose, Ondansetron,  Pantoprazole, Xifaxan  Endocrine: Jardiance, Levemir, Novolog  Pain: Acetaminophen Vitamins/Minerals/Supplements: Biotin, cyanocobalamin, magnesium, multiple vitamin, probiotic,   Miscellaneous: Systane Eye drops  Medication Review Findings:   Adherence-patient's son reported patient would be out of Xifaxan after today.  Medication Assistance Findings:  Medication assistance needs identified: Xifaxan, Levemir, Novolog, Jardiance  Extra Help:  Not eligible for Extra Help Low Income Subsidy based on reported income and assets  Patient Assistance Programs: Xifaxan made by [employer] Product manager Patient Assistance Program) o Income requirement met: Yes o Out-of-pocket prescription expenditure met:   Not Applicable - Patient has met application requirements to apply for this program.    Tyler Aas and Novolog made by [place] requirement met: Yes o Out-of-pocket prescription expenditure met:   Not Applicable - Patient has met application requirements to apply for this program.    Jardiance made by [place] requirement met: Yes o Out-of-pocket prescription expenditure met:   Not Applicable - Patient has met application requirements to apply for this program.     Additional medication assistance options reviewed with patient as warranted:  No other options identified--PAN foundation MAY be an option if patient is ultimately diagnosed with liver cancer. She has an appointment to discuss MRI results next week. Her son will stay in touch.    Cirrhosis is not a covered disease state on the [employer].    Plan: . I will route patient assistance letter to [place] technician who will coordinate patient assistance program application process for medications listed above.  [facility] pharmacy technician will assist with obtaining all required documents from both patient and provider(s) and submit application(s) once completed.  . Will follow-up in 1-2  weeks.   Elayne Guerin, PharmD, [place] Clinical Pharmacist [phone]

## 2019-06-06 ENCOUNTER — Other Ambulatory Visit: Payer: Self-pay | Admitting: Pharmacy Technician

## 2019-06-06 ENCOUNTER — Other Ambulatory Visit: Payer: Self-pay | Admitting: Internal Medicine

## 2019-06-06 ENCOUNTER — Other Ambulatory Visit: Payer: Self-pay | Admitting: *Deleted

## 2019-06-06 ENCOUNTER — Other Ambulatory Visit: Payer: Self-pay | Admitting: Gastroenterology

## 2019-06-06 DIAGNOSIS — R188 Other ascites: Secondary | ICD-10-CM

## 2019-06-06 DIAGNOSIS — K7460 Unspecified cirrhosis of liver: Secondary | ICD-10-CM

## 2019-06-06 NOTE — Patient Outreach (Signed)
[place] [facility]) Care Management  06/06/2019  KELEE CUNNINGHAM Nov 10, 1945 [id number]   Case conference completed with multidisciplinary team to discuss management of member's care plan.  Notified team of contact with son who has taken responsibility of member's care.  Team aware of new scans ordered for next week and follow up with GI for results.  This care manager will follow up with son within the next 2 weeks as planned.  [name], [place], MSN [place] [phone]

## 2019-06-06 NOTE — Telephone Encounter (Signed)
The pt's son has been advised that some samples are at the front desk for pick up. I also confirmed that he has heard from [facility] and forms will be sent out to complete for assistance.  He will contact our office if he has any further concerns or problems

## 2019-06-06 NOTE — Telephone Encounter (Signed)
Pt son would like to know if they can get samples

## 2019-06-06 NOTE — Patient Outreach (Addendum)
[name]  06/06/2019  [name] [birth year] [id number]                                      Medication Assistance Referral  Referral From: [place]  Medication/Company: Levemir  and Novolog / [name] Patient application portion:  Education officer, museum portion: Faxed  to Dr. Hollace Kinnier on 06/07/2019 Provider address/fax verified via: Office website  Medication/Company: [name] / [name] Patient application portion:  Mailed Provider application portion: Faxed  to Dr. Hollace Kinnier on 06/07/2019 Provider address/fax verified via: Office website  Medication/Company: [name] / [name] Patient application portion:  Mailed Provider application portion: Faxed  to Dr. Owens Loffler on 06/07/2019 Provider address/fax verified via: Office website     Follow up:  Will follow up with patient in 5-15 business days to confirm application(s) have been received.  Jaydee Ingman P. Julieanne Hadsall, [place] Management [phone]

## 2019-06-06 NOTE — Telephone Encounter (Signed)
rx sent to pharmacy by e-script  

## 2019-06-07 ENCOUNTER — Ambulatory Visit: Payer: Medicare Other | Admitting: Gastroenterology

## 2019-06-07 DIAGNOSIS — I8501 Esophageal varices with bleeding: Secondary | ICD-10-CM | POA: Diagnosis not present

## 2019-06-07 DIAGNOSIS — K7469 Other cirrhosis of liver: Secondary | ICD-10-CM | POA: Diagnosis not present

## 2019-06-07 DIAGNOSIS — K7581 Nonalcoholic steatohepatitis (NASH): Secondary | ICD-10-CM | POA: Diagnosis not present

## 2019-06-07 DIAGNOSIS — I2510 Atherosclerotic heart disease of native coronary artery without angina pectoris: Secondary | ICD-10-CM | POA: Diagnosis not present

## 2019-06-07 DIAGNOSIS — K3189 Other diseases of stomach and duodenum: Secondary | ICD-10-CM | POA: Diagnosis not present

## 2019-06-07 DIAGNOSIS — I81 Portal vein thrombosis: Secondary | ICD-10-CM | POA: Diagnosis not present

## 2019-06-10 ENCOUNTER — Other Ambulatory Visit: Payer: Self-pay | Admitting: *Deleted

## 2019-06-10 DIAGNOSIS — I81 Portal vein thrombosis: Secondary | ICD-10-CM | POA: Diagnosis not present

## 2019-06-10 DIAGNOSIS — I8501 Esophageal varices with bleeding: Secondary | ICD-10-CM | POA: Diagnosis not present

## 2019-06-10 DIAGNOSIS — K7581 Nonalcoholic steatohepatitis (NASH): Secondary | ICD-10-CM | POA: Diagnosis not present

## 2019-06-10 DIAGNOSIS — I2510 Atherosclerotic heart disease of native coronary artery without angina pectoris: Secondary | ICD-10-CM | POA: Diagnosis not present

## 2019-06-10 DIAGNOSIS — K3189 Other diseases of stomach and duodenum: Secondary | ICD-10-CM | POA: Diagnosis not present

## 2019-06-10 DIAGNOSIS — K7469 Other cirrhosis of liver: Secondary | ICD-10-CM | POA: Diagnosis not present

## 2019-06-10 MED ORDER — PANTOPRAZOLE SODIUM 40 MG PO TBEC: 40.0000 mg | DELAYED_RELEASE_TABLET | Freq: Every day | ORAL | 1 refills | Status: DC

## 2019-06-11 ENCOUNTER — Encounter: Payer: Self-pay | Admitting: Internal Medicine

## 2019-06-11 DIAGNOSIS — I8501 Esophageal varices with bleeding: Secondary | ICD-10-CM | POA: Diagnosis not present

## 2019-06-11 DIAGNOSIS — I2510 Atherosclerotic heart disease of native coronary artery without angina pectoris: Secondary | ICD-10-CM | POA: Diagnosis not present

## 2019-06-11 DIAGNOSIS — K7581 Nonalcoholic steatohepatitis (NASH): Secondary | ICD-10-CM | POA: Diagnosis not present

## 2019-06-11 DIAGNOSIS — K3189 Other diseases of stomach and duodenum: Secondary | ICD-10-CM | POA: Diagnosis not present

## 2019-06-11 DIAGNOSIS — I81 Portal vein thrombosis: Secondary | ICD-10-CM | POA: Diagnosis not present

## 2019-06-11 DIAGNOSIS — K7469 Other cirrhosis of liver: Secondary | ICD-10-CM | POA: Diagnosis not present

## 2019-06-11 LAB — BASIC METABOLIC PANEL
BUN: 24 — AB (ref 4–21)
CO2: 22 (ref 13–22)
Chloride: 98 — AB (ref 99–108)
Creatinine: 0.9 (ref 0.5–1.1)
Creatinine: 0.9 (ref <=1.1)
Glucose: 195
Potassium: 4.3 (ref 3.4–5.3)
Sodium: 137 (ref 137–147)

## 2019-06-11 LAB — COMPREHENSIVE METABOLIC PANEL
Albumin: 2.7 — AB (ref 3.5–5.0)
Calcium: 10 (ref 8.7–10.7)
GFR calc Af Amer: 79
GFR calc non Af Amer: 69
Globulin: 3.8

## 2019-06-11 LAB — CBC AND DIFFERENTIAL
HCT: 38 (ref 36–46)
Hemoglobin: 13.2 (ref 12.0–16.0)
Neutrophils Absolute: 3
Platelets: 125 — AB (ref 150–399)
WBC: 5

## 2019-06-11 LAB — HEPATIC FUNCTION PANEL
ALT: 42 — AB (ref 7–35)
AST: 80 — AB (ref 13–35)
Alkaline Phosphatase: 259 — AB (ref 25–125)
Bilirubin, Total: 1.6

## 2019-06-11 LAB — CBC: RBC: 3.97 (ref 3.87–5.11)

## 2019-06-11 NOTE — Telephone Encounter (Signed)
Please call home health agency and provide verbal orders for the nurse who is coming at 2pm today to obtain stat CBC and CMP and a clean catch (clean as possible) UA c+s due to patient's nausea.

## 2019-06-12 ENCOUNTER — Ambulatory Visit (INDEPENDENT_AMBULATORY_CARE_PROVIDER_SITE_OTHER): Payer: Medicare Other | Admitting: Family

## 2019-06-12 ENCOUNTER — Encounter: Payer: Self-pay | Admitting: Family

## 2019-06-12 ENCOUNTER — Other Ambulatory Visit: Payer: Self-pay

## 2019-06-12 VITALS — BP 109/46 | Ht 63.0 in | Wt 140.0 lb

## 2019-06-12 DIAGNOSIS — M1990 Unspecified osteoarthritis, unspecified site: Secondary | ICD-10-CM | POA: Diagnosis not present

## 2019-06-12 DIAGNOSIS — I959 Hypotension, unspecified: Secondary | ICD-10-CM

## 2019-06-12 DIAGNOSIS — I81 Portal vein thrombosis: Secondary | ICD-10-CM | POA: Diagnosis not present

## 2019-06-12 DIAGNOSIS — R1310 Dysphagia, unspecified: Secondary | ICD-10-CM | POA: Diagnosis not present

## 2019-06-12 DIAGNOSIS — K7290 Hepatic failure, unspecified without coma: Secondary | ICD-10-CM | POA: Diagnosis not present

## 2019-06-12 DIAGNOSIS — E785 Hyperlipidemia, unspecified: Secondary | ICD-10-CM | POA: Diagnosis not present

## 2019-06-12 DIAGNOSIS — N181 Chronic kidney disease, stage 1: Secondary | ICD-10-CM | POA: Diagnosis not present

## 2019-06-12 DIAGNOSIS — E8842 MERRF syndrome: Secondary | ICD-10-CM

## 2019-06-12 DIAGNOSIS — K219 Gastro-esophageal reflux disease without esophagitis: Secondary | ICD-10-CM | POA: Diagnosis not present

## 2019-06-12 DIAGNOSIS — I8501 Esophageal varices with bleeding: Secondary | ICD-10-CM | POA: Diagnosis not present

## 2019-06-12 DIAGNOSIS — D696 Thrombocytopenia, unspecified: Secondary | ICD-10-CM | POA: Diagnosis not present

## 2019-06-12 DIAGNOSIS — Z951 Presence of aortocoronary bypass graft: Secondary | ICD-10-CM | POA: Diagnosis not present

## 2019-06-12 DIAGNOSIS — Z794 Long term (current) use of insulin: Secondary | ICD-10-CM | POA: Diagnosis not present

## 2019-06-12 DIAGNOSIS — I130 Hypertensive heart and chronic kidney disease with heart failure and stage 1 through stage 4 chronic kidney disease, or unspecified chronic kidney disease: Secondary | ICD-10-CM | POA: Diagnosis not present

## 2019-06-12 DIAGNOSIS — K7460 Unspecified cirrhosis of liver: Secondary | ICD-10-CM

## 2019-06-12 DIAGNOSIS — R188 Other ascites: Secondary | ICD-10-CM

## 2019-06-12 DIAGNOSIS — K3189 Other diseases of stomach and duodenum: Secondary | ICD-10-CM

## 2019-06-12 DIAGNOSIS — E1122 Type 2 diabetes mellitus with diabetic chronic kidney disease: Secondary | ICD-10-CM | POA: Diagnosis not present

## 2019-06-12 DIAGNOSIS — K7469 Other cirrhosis of liver: Secondary | ICD-10-CM | POA: Diagnosis not present

## 2019-06-12 DIAGNOSIS — K7581 Nonalcoholic steatohepatitis (NASH): Secondary | ICD-10-CM | POA: Diagnosis not present

## 2019-06-12 DIAGNOSIS — E1136 Type 2 diabetes mellitus with diabetic cataract: Secondary | ICD-10-CM | POA: Diagnosis not present

## 2019-06-12 DIAGNOSIS — I509 Heart failure, unspecified: Secondary | ICD-10-CM | POA: Diagnosis not present

## 2019-06-12 DIAGNOSIS — E43 Unspecified severe protein-calorie malnutrition: Secondary | ICD-10-CM | POA: Diagnosis not present

## 2019-06-12 DIAGNOSIS — Z9181 History of falling: Secondary | ICD-10-CM | POA: Diagnosis not present

## 2019-06-12 DIAGNOSIS — I2510 Atherosclerotic heart disease of native coronary artery without angina pectoris: Secondary | ICD-10-CM | POA: Diagnosis not present

## 2019-06-12 MED ORDER — FUROSEMIDE 20 MG PO TABS: 20.0000 mg | ORAL_TABLET | Freq: Every day | ORAL | Status: DC

## 2019-06-12 MED ORDER — LACTULOSE 10 GM/15ML PO SOLN: 30.0000 g | Freq: Three times a day (TID) | ORAL | 0 refills | Status: DC

## 2019-06-12 NOTE — Patient Instructions (Signed)
Hypotension As your heart beats, it forces blood through your body. This force is called blood pressure. If you have hypotension, you have low blood pressure. When your blood pressure is too low, you may not get enough blood to your brain or other parts of your body. This may cause you to feel weak, light-headed, have a fast heartbeat, or even pass out (faint). Low blood pressure may be harmless, or it may cause serious problems. What are the causes?  Blood loss.  Not enough water in the body (dehydration).  Heart problems.  Hormone problems.  Pregnancy.  A very bad infection.  Not having enough of certain nutrients.  Very bad allergic reactions.  Certain medicines. What increases the risk?  Age. The risk increases as you get older.  Conditions that affect the heart or the brain and spinal cord (central nervous system).  Taking certain medicines.  Being pregnant. What are the signs or symptoms?  Feeling: ? Weak. ? Light-headed. ? Dizzy. ? Tired (fatigued).  Blurred vision.  Fast heartbeat.  Passing out, in very bad cases. How is this treated?  Changing your diet. This may involve eating more salt (sodium) or drinking more water.  Taking medicines to raise your blood pressure.  Changing how much you take (the dosage) of some of your medicines.  Wearing compression stockings. These stockings help to prevent blood clots and reduce swelling in your legs. In some cases, you may need to go to the hospital for:  Fluid replacement. This means you will receive fluids through an IV tube.  Blood replacement. This means you will receive donated blood through an IV tube (transfusion).  Treating an infection or heart problems, if this applies.  Monitoring. You may need to be monitored while medicines that you are taking wear off. Follow these instructions at home: Eating and drinking   Drink enough fluids to keep your pee (urine) pale yellow.  Eat a healthy diet.  Follow instructions from your doctor about what you can eat or drink. A healthy diet includes: ? Fresh fruits and vegetables. ? Whole grains. ? Low-fat (lean) meats. ? Low-fat dairy products.  Eat extra salt only as told. Do not add extra salt to your diet unless your doctor tells you to.  Eat small meals often.  Avoid standing up quickly after you eat. Medicines  Take over-the-counter and prescription medicines only as told by your doctor. ? Follow instructions from your doctor about changing how much you take of your medicines, if this applies. ? Do not stop or change any of your medicines on your own. General instructions   Wear compression stockings as told by your doctor.  Get up slowly from lying down or sitting.  Avoid hot showers and a lot of heat as told by your doctor.  Return to your normal activities as told by your doctor. Ask what activities are safe for you.  Do not use any products that contain nicotine or tobacco, such as cigarettes, e-cigarettes, and chewing tobacco. If you need help quitting, ask your doctor.  Keep all follow-up visits as told by your doctor. This is important. Contact a doctor if:  You throw up (vomit).  You have watery poop (diarrhea).  You have a fever for more than 2-3 days.  You feel more thirsty than normal.  You feel weak and tired. Get help right away if:  You have chest pain.  You have a fast or uneven heartbeat.  You lose feeling (have numbness) in any  part of your body.  You cannot move your arms or your legs.  You have trouble talking.  You get sweaty or feel light-headed.  You pass out.  You have trouble breathing.  You have trouble staying awake.  You feel mixed up (confused). Summary  Hypotension is also called low blood pressure. It is when the force of blood pumping through your arteries is too weak.  Hypotension may be harmless, or it may cause serious problems.  Treatment may include changing  your diet and medicines, and wearing compression stockings.  In very bad cases, you may need to go to the hospital. This information is not intended to replace advice given to you by your health care provider. Make sure you discuss any questions you have with your health care provider. Document Revised: 10/19/2017 Document Reviewed: 10/19/2017 Elsevier Patient Education  [name].

## 2019-06-12 NOTE — Progress Notes (Signed)
Patient ID: [name], female   DOB: 04-04-1946, 74 y.o.   MRN: [id number] This service is provided via telemedicine  No vital signs collected/recorded due to the encounter was a telemedicine visit.   Location of patient (ex: home, work):  HOME  Patient consents to a telephone visit:  YES  Location of the provider (ex: office, home):  OFFICE  Name of any referring provider:  DR Jonelle Sidle REED, DO  Names of all persons participating in the telemedicine service and their role in the encounter:  PATIENT, [name], [place], Marlowe Sax, NP  Time spent on call:  4:30   Provider: Eternity Dexter FNP-C  Gayland Curry, DO  Patient Care Team: Gayland Curry, DO as PCP - General (Geriatric Medicine) Berle Mull, MD as Consulting Physician (Family Medicine) Sharmon Revere as Physician Assistant (Cardiology) Calvert Cantor, MD as Consulting Physician (Ophthalmology) [name], MD as Attending Physician (Gastroenterology) [name], RN as [place] Management Luciana Axe, Angelia Mould, [facility] (Pharmacist) [place], Luiz Ochoa, CPhT as [place] Management (Pharmacy Technician)  Extended Emergency Contact Information Primary Emergency Contact: Blase Mess Address: [address]          [place], [place] [address] Johnnette Litter of [place] Phone: [phone] Mobile Phone: [phone] Relation: Spouse Secondary Emergency Contact: Sandie Ano Address: [address]          [place], [place] [address] Johnnette Litter of [place] Phone: [phone] Mobile Phone: [phone] Relation: Son  Code Status: Full code  Goals of care: Advanced Directive information Advanced Directives 05/16/2019  Does Patient Have a Medical Advance Directive? Yes  Type of Advance Directive Living will  Does patient want to make changes to medical advance directive? No - Patient declined  Would patient like information on creating a medical advance directive?  -     Chief Complaint  Patient presents with  . Acute Visit    low blood pressure    HPI:  Pt is a 74 y.o. female seen today for an acute visit for evaluation of low blood pressure.Husband present during visit.She states home health Nurse checked her blood pressure last evening and reading was low 100/45.she denies any denies any signs of hypotension.Her blood pressure today checked by Husband today during visit 109/46.she is currently on Spironolactone 100 mg tablet twice daily and Furosemide 40 mg tablet daily.No decreased in urine output. States weight this morning was 140 lbs.yesterday weighed 142 lbs.She does not wear her compression stockings but keeps her legs elevated.she states swellings on her legs have improved.    Also complains of diarrhea keeping her up at night.she has more than 4 times loose stool.Takes 45 mls of lactulose four times daily due to liver cirrohosis.Had Nausea yesterday took Zofran with relief.she denies any abdominal pain or cramping.states appetite is good and drinks about 4 glasses of water daily.    Past Medical History:  Diagnosis Date  . Allergy   . Arthritis    neck  . Cataract    bilateral - MD monitoring cataracts  . CHF (congestive heart failure) ([place])   . Chronic kidney disease, stage I    DR OTTELIN  HX UTIS  . Cirrhosis ([place])   . Cramp of limb   . Diabetes mellitus   . Dysphagia, unspecified(787.20)   . Dysuria   . Epistaxis   . GERD (gastroesophageal reflux disease)   . Heart murmur    NO CARDIOLOGIST  DX FOR YEARS ASYMPTOMATIC  .  Hepatic encephalopathy ([place])   . Lumbago   . Neoplasm of uncertain behavior of skin   . Nonspecific elevation of levels of transaminase or lactic acid dehydrogenase (LDH)   . Osteoarthrosis, unspecified whether generalized or localized, unspecified site   . Other and unspecified hyperlipidemia    diet controlled  . Pain in joint, shoulder region   . Paresthesias 04/01/2015  . Postablative ovarian  failure   . Trochanteric bursitis of left hip 12/15/2015  . Type 2 diabetes mellitus without complication ([place])   . Unspecified essential hypertension    no meds   Past Surgical History:  Procedure Laterality Date  . BREAST BIOPSY    . CARDIAC CATHETERIZATION N/A 01/27/2016   Procedure: Left Heart Cath and Coronary Angiography;  Surgeon: Belva Crome, MD;  Location: [facility];  Service: Cardiovascular;  Laterality: N/A;  . COLONOSCOPY  2012   Dr Lajoyce Corners.   . COLONOSCOPY WITH PROPOFOL N/A 07/07/2016   Procedure: COLONOSCOPY WITH PROPOFOL;  Surgeon: [name], MD;  Location: [facility];  Service: Endoscopy;  Laterality: N/A;  . CORONARY ARTERY BYPASS GRAFT N/A 01/28/2016   Procedure: CORONARY ARTERY BYPASS GRAFTING (CABG) x 3 USING RIGHT LEG GREATER SAPHENOUS VEIN GRAFT;  Surgeon: [place] Nakayama, MD;  Location: [facility];  Service: Open Heart Surgery;  Laterality: N/A;  . ENDOVEIN HARVEST OF GREATER SAPHENOUS VEIN Right 01/28/2016   Procedure: ENDOVEIN HARVEST OF GREATER SAPHENOUS VEIN;  Surgeon: [place] Nakayama, MD;  Location: [facility];  Service: Open Heart Surgery;  Laterality: Right;  . ESOPHAGEAL BANDING  03/28/2019   Procedure: ESOPHAGEAL BANDING;  Surgeon: [name], MD;  Location: [facility];  Service: Endoscopy;;  . ESOPHAGEAL BANDING  04/07/2019   Procedure: ESOPHAGEAL BANDING;  Surgeon: Juanita Craver, MD;  Location: [facility] END[facility];  Service: Endoscopy;;  . ESOPHAGOGASTRODUODENOSCOPY N/A 04/07/2019   Procedure: ESOPHAGOGASTRODUODENOSCOPY (EGD);  Surgeon: Juanita Craver, MD;  Location[facility];  Service: Endoscopy;  Laterality: N/A;  . ESOPHAGOGASTRODUODENOSCOPY (EGD) WITH PROPOFOL N/A 07/07/2016   Procedure: ESOPHAGOGASTRODUODENOSCOPY (EGD) WITH PROPOFOL;  Surgeon: [name], MD;  [facility];  Service: Endoscopy;  Laterality: N/A;  . ESOPHAGOGASTRODUODENOSCOPY (EGD) WITH PROPOFOL N/A 03/28/2019   Procedure: ESOPHAGOGASTRODUODENOSCOPY (EGD) WITH  PROPOFOL;  Surgeon: [name], MD;  [facility];  Service: Endoscopy;  Laterality: N/A;  . HEMOSTASIS CLIP PLACEMENT  04/07/2019   Procedure: HEMOSTASIS CLIP PLACEMENT;  Surgeon: Juanita Craver, MD;  Location: [facility];  Service: Endoscopy;;  . IR ANGIOGRAM SELECTIVE EACH ADDITIONAL VESSEL  04/08/2019  . IR EMBO ART  VEN HEMORR LYMPH EXTRAV  INC GUIDE ROADMAPPING  04/08/2019  . IR PARACENTESIS  04/08/2019  . IR TIPS  04/08/2019  . MAXIMUM ACCESS (MAS)POSTERIOR LUMBAR INTERBODY FUSION (PLIF) 1 LEVEL Left 06/10/2015   Procedure: FOR MAXIMUM ACCESS (MAS) POSTERIOR LUMBAR INTERBODY FUSION (PLIF) LUMBAR THREE-FOUR EXTRAFORAMINAL MICRODISCECTOMY LUMBAR FIVE-SACRAL ONE LEFT;  Surgeon: [place]ustace Moore, MD;  Location: [facility];  Service: Neurosurgery;  Laterality: Left;  . RADIOLOGY WITH ANESTHESIA N/A 04/08/2019   Procedure: RADIOLOGY WITH ANESTHESIA;  Surgeon: Radiologist, Medication, MD;  Location: [facility];  Service: Radiology;  Laterality: N/A;  . SCLEROTHERAPY  04/07/2019   Procedure: SCLEROTHERAPY;  Surgeon: Juanita Craver, MD;  Location[facility] END[facility]COPY;  Service: Endoscopy;;  . TEE WITHOUT CARDIOVERSION N/A 01/28/2016   Procedure: TRANSESOPHAGEAL ECHOCARDIOGRAM (TEE);  Surgeon: [place] Nakayama, MD;  Location: [facility];  Service: Open Heart Surgery;  Laterality: N/A;  . TUBAL LIGATION  40   Dr Connye Burkitt  . UPPER GASTROINTESTINAL ENDOSCOPY    . VAGINAL HYSTERECTOMY  1997   Dr Rande Lawman    Allergies  Allergen Reactions  . Kiwi Extract Anaphylaxis  . Tdap [Tetanus-Diphth-Acell Pertussis] Swelling and Other (See Comments)    Swelling at injection site, gets very hot  . Statins Other (See Comments)    RHABDOMYOLYSIS  . Latex Itching, Dermatitis and Rash  . Tramadol Nausea And Vomiting    Outpatient Encounter Medications as of 06/12/2019  Medication Sig  . acetaminophen (TYLENOL) 500 MG tablet Take 500 mg by mouth at bedtime.   . BD PEN NEEDLE NANO U/F 32G X 4 MM MISC USE THREE  TIMES DAILY AS DIRECTED  . Biotin 10000 MCG TABS Take 10,000 mcg by mouth every morning.  . Cyanocobalamin (VITAMIN B 12 PO) Take 1,000 mcg by mouth daily.    [name] Kitchen ezetimibe (ZETIA) 10 MG tablet TAKE 1 TABLET(10 MG) BY MOUTH DAILY  . furosemide (LASIX) 40 MG tablet Take 1 tablet (40 mg total) by mouth daily.  [name] Kitchen glucose blood test strip One Touch Ultra II strips. Use to test blood sugar three times daily. Dx: E11.65  . insulin detemir (LEVEMIR) 100 UNIT/ML injection Inject 0.1 mLs (10 Units total) into the skin at bedtime.  . Insulin Syringe-Needle U-100 (INSULIN SYRINGE 1CC/31GX5/16") 31G X 5/16" 1 ML MISC USE AS DIRECTED DAILY WITH LEVEMIR  . JARDIANCE 25 MG TABS tablet Take 25 mg by mouth daily.  [name] Kitchen lactulose (CHRONULAC) 10 GM/15ML solution Take 45 mLs (30 g total) by mouth 4 (four) times daily.  [name] Kitchen loratadine (CLARITIN) 10 MG tablet Take 10 mg by mouth daily as needed for allergies.  [name] Kitchen MAGNESIUM PO Take 500 mg by mouth daily.   . Multiple Vitamins-Minerals (MULTIVITAMIN WITH MINERALS) tablet Take 1 tablet by mouth daily.    [name] Kitchen NOVOLOG FLEXPEN 100 UNIT/ML FlexPen Inject 5 units with breakfast, lunch, and dinner.  . ondansetron (ZOFRAN) 4 MG tablet Take 1 tablet (4 mg total) by mouth every 6 (six) hours as needed for nausea.  . pantoprazole (PROTONIX) 40 MG tablet Take 40 mg by mouth 2 (two) times daily.  Vladimir Faster Glycol-Propyl Glycol (SYSTANE OP) Place 1 drop into both eyes daily.   . Probiotic Product (PROBIOTIC DAILY PO) Take 1 capsule by mouth daily. Digestive Advantage Probiotic  . rifaximin (XIFAXAN) 550 MG TABS tablet Take 1 tablet (550 mg total) by mouth 2 (two) times daily.  [name] Kitchen spironolactone (ALDACTONE) 100 MG tablet TAKE 1 TABLET(100 MG) BY MOUTH TWICE DAILY  . [DISCONTINUED] omeprazole (PRILOSEC) 20 MG capsule TAKE 1 CAPSULE BY MOUTH DAILY  . [DISCONTINUED] pantoprazole (PROTONIX) 40 MG tablet Take 1 tablet (40 mg total) by mouth daily.  . [DISCONTINUED] UNABLE TO FIND Med Name:  Advance Home health Nurse to draw lab work in 1 week: CBC/diff and CMP   No facility-administered encounter medications on file as of 06/12/2019.    Review of Systems  Constitutional: Negative for appetite change, chills, fever and unexpected weight change.       Tired all the time.   Respiratory: Negative for cough, chest tightness, shortness of breath and wheezing.   Cardiovascular: Positive for leg swelling. Negative for chest pain and palpitations.       Swelling on legs has improved   Gastrointestinal: Negative for abdominal distention, abdominal pain, blood in stool, constipation and vomiting.       Chronic nausea and loose stool   Genitourinary: Negative for decreased urine  volume, difficulty urinating, dysuria, flank pain, frequency and urgency.  Skin: Negative for color change, pallor and rash.  Neurological: Negative for dizziness, speech difficulty, light-headedness and headaches.  Psychiatric/Behavioral: Negative for agitation, confusion and sleep disturbance. The patient is not nervous/anxious.     Immunization History  Administered Date(s) Administered  . Fluad Quad(high Dose 65+) 01/31/2019  . Hep A / Hep B 06/24/2016, 07/04/2016, 07/21/2016  . Influenza Whole 02/28/2012  . Influenza, High Dose Seasonal PF 01/26/2017, 01/29/2018  . Influenza,inj,Quad PF,6+ Mos 03/27/2013, 01/16/2014, 03/31/2015, 02/26/2016  . Pneumococcal Conjugate-13 06/28/2011  . Pneumococcal Polysaccharide-23 04/19/2016  . Td 01/12/1999   Pertinent  Health Maintenance Due  Topic Date Due  . OPHTHALMOLOGY EXAM  08/24/2018  . HEMOGLOBIN A1C  09/29/2019  . FOOT EXAM  10/04/2019  . URINE MICROALBUMIN  01/31/2020  . MAMMOGRAM  09/25/2020  . COLONOSCOPY  07/07/2021  . INFLUENZA VACCINE  Completed  . DEXA SCAN  Completed  . PNA vac Low Risk Adult  Completed   Fall Risk  06/12/2019 05/13/2019 04/19/2019 04/15/2019 10/04/2018  Falls in the past year? 0 0 0 0 0  Number falls in past yr: 0 0 0 - 0  Injury  with Fall? 0 0 0 - 0    Vitals:   06/12/19 1101  BP: (!) 109/46  Weight: 140 lb (63.5 kg)  Height: 5' 3"  (1.6 m)   Body mass index is 24.8 kg/m. Physical Exam Unable to complete on telephone visit.   Labs reviewed: Recent Labs    05/30/19 0424 05/31/19 0500 06/01/19 0239  NA 139 137 140  K 4.0 3.6 3.6  CL 111 110 116*  CO2 20* 19* 18*  GLUCOSE 192* 239* 239*  BUN 13 15 12   CREATININE 0.88 0.93 0.82  CALCIUM 9.6 9.1 9.1  MG 2.0 1.9 1.6*  PHOS 2.0* 3.3 3.5   Recent Labs    05/16/19 0719 05/16/19 0719 05/26/19 1332 05/26/19 1332 05/27/19 0352 05/28/19 0257 05/30/19 0424 05/31/19 0500 06/01/19 0239  AST 58*  --  60*  --  53*  --   --   --   --   ALT 43  --  45*  --  36  --   --   --   --   ALKPHOS 104  --  190*  --  148*  --   --   --   --   BILITOT 4.2*  --  3.1*  --  2.2*  --   --   --   --   PROT 6.6  --  7.4  --  5.8*  --   --   --   --   ALBUMIN 2.6*   < > 2.8*   < > 2.2*   < > 2.1* 2.0* 1.9*   < > = values in this interval not displayed.   Recent Labs    05/30/19 0424 05/31/19 0500 06/01/19 0239  WBC 3.9* 3.5* 3.9*  NEUTROABS 2.1 1.8 2.2  HGB 12.9 12.4 12.2  HCT 38.8 36.3 35.4*  MCV 99.2 98.9 97.3  PLT 76* 77* 82*   Lab Results  Component Value Date   TSH 2.150 06/23/2014   Lab Results  Component Value Date   HGBA1C 8.4 (H) 04/01/2019   Lab Results  Component Value Date   CHOL 147 05/24/2018   HDL 53 05/24/2018   LDLCALC 80 05/24/2018   TRIG 64 05/24/2018   CHOLHDL 2.8 05/24/2018    Significant Diagnostic Results in  last 30 days:  CT Head Wo Contrast  Result Date: 05/15/2019 CLINICAL DATA:  Altered mental status.  Confusion. EXAM: CT HEAD WITHOUT CONTRAST TECHNIQUE: Contiguous axial images were obtained from the base of the skull through the vertex without intravenous contrast. COMPARISON:  None. FINDINGS: Brain: The ventricles and sulci appear normal in size and contour. There is no intracranial mass, hemorrhage, extra-axial fluid  collection, or midline shift. There is mild small vessel disease in the centra semiovale bilaterally. No acute appearing infarct is evident on this study. Vascular: There is no hyperdense vessel. There is calcification in each carotid siphon region. Skull: The bony calvarium appears intact. Sinuses/Orbits: There is mucosal thickening in several ethmoid air cells. Other visualized paranasal sinuses are clear. Orbits appear symmetric bilaterally. Other: Mastoid air cells are clear. IMPRESSION: Mild periventricular small vessel disease. No acute infarct. No mass or hemorrhage. There are foci of arterial vascular calcification. There is mucosal thickening in several ethmoid air cells. Electronically Signed   By: Lowella Grip III M.D.   On: 05/15/2019 11:37   MR LIVER W WO CONTRAST  Result Date: 05/30/2019 CLINICAL DATA:  Inpatient. Cirrhosis, admitted with a patent encephalopathy. History of TIPS placed 04/08/2019. Right liver mass on recent ultrasound. EXAM: MRI ABDOMEN WITHOUT AND WITH CONTRAST TECHNIQUE: Multiplanar multisequence MR imaging of the abdomen was performed both before and after the administration of intravenous contrast. CONTRAST:  40m GADAVIST GADOBUTROL 1 MMOL/ML IV SOLN COMPARISON:  05/29/2019 liver Doppler scan. 03/29/2019 CT abdomen/pelvis. 04/07/2017 MRI abdomen. FINDINGS: Scan significantly limited by motion degradation, particularly on the postcontrast sequences. Lower chest: Trace dependent bilateral pleural effusions. Hepatobiliary: Diffusely irregular liver surface compatible with cirrhosis. No hepatic steatosis. There is a 3.0 x 2.9 cm right liver lobe masslike focus (series 5/image 8) with heterogeneous mild T2 hyperintensity, restricted diffusion, mixed T1 signal with peripheral T1 hyperintensity and no compelling evidence of enhancement on the substantially motion degraded postcontrast sequences, located just lateral to the midportion of the TIPS and new since 04/06/2019 CT. No  additional liver lesions. Numerous subcentimeter gallstones layering in the mildly distended gallbladder. Mild diffuse gallbladder wall thickening. No biliary ductal dilatation. Common bile duct diameter 6 mm. No appreciable choledocholithiasis, biliary strictures, biliary masses or beading on the motion degraded sequences. Pancreas: No pancreatic mass or duct dilation. Spleen: Normal size. No mass. Adrenals/Urinary Tract: Normal adrenals. No hydronephrosis. Normal kidneys with no renal mass. Stomach/Bowel: Normal non-distended stomach. Visualized small and large bowel is normal caliber, with no definite bowel wall thickening. Vascular/Lymphatic: Normal caliber abdominal aorta. Patent splenic and renal veins. Well-positioned TIPS extending proximally from the main portal vein to the junction of the right hepatic vein and IVC, which appears patent on the limited motion degraded postcontrast sequences. Moderate paraumbilical and gastrosplenic varices. Small lower esophageal varices. No pathologically enlarged lymph nodes in the abdomen. Other: Small volume ascites. Musculoskeletal: No aggressive appearing focal osseous lesions. Bilateral posterior spinal fusion hardware in the lower lumbar spine. IMPRESSION: 1. Limited motion degraded scan. 2. Cirrhosis. Right liver lobe 3.0 cm mass with mixed signal intensity, restricted diffusion and no compelling evidence of enhancement on this motion degraded scan, located just lateral to the midportion of the TIPS, new since most recent comparison study (04/06/2019 CT). This mass remains indeterminate for neoplasm on the basis of this limited MRI study, however findings favor a biloma. Infected biloma cannot be excluded. Percutaneous biopsy could be considered. Short-term outpatient follow-up MRI abdomen without and with IV contrast recommended in 3 months. 3.  TIPS appears well positioned and patent. 4. Small volume ascites. 5. Small lower esophageal, moderate paraumbilical and  moderate gastrosplenic varices. 6. Trace dependent bilateral pleural effusions. Electronically Signed   By: Ilona Sorrel M.D.   On: 05/30/2019 20:15   DG Chest Port 1 View  Result Date: 05/26/2019 CLINICAL DATA:  Nausea, vomiting and weakness since last Tuesday. EXAM: PORTABLE CHEST 1 VIEW COMPARISON:  Chest x-ray 05/17/2019 FINDINGS: The cardiac silhouette, mediastinal and hilar contours are within normal limits. Mild tortuosity and calcification of the thoracic aorta. Stable surgical changes from bypass surgery. The lungs are clear of an acute process. No pleural effusions or pulmonary lesions. The bony thorax is intact. IMPRESSION: No acute cardiopulmonary findings. Electronically Signed   By: Marijo Sanes M.D.   On: 05/26/2019 16:02   DG CHEST PORT 1 VIEW  Result Date: 05/17/2019 CLINICAL DATA:  Mild cough EXAM: PORTABLE CHEST 1 VIEW COMPARISON:  05/15/2019 FINDINGS: Cardiac shadow is stable. Postsurgical changes are again seen. Tip shunt is noted in the upper abdomen. The lungs are well aerated without focal infiltrate or sizable effusion. No bony abnormality is noted. IMPRESSION: No acute abnormality seen. Electronically Signed   By: Inez Catalina M.D.   On: 05/17/2019 20:25   DG Chest Portable 1 View  Result Date: 05/15/2019 CLINICAL DATA:  Altered mental status EXAM: PORTABLE CHEST 1 VIEW COMPARISON:  04/01/2019 FINDINGS: Prior CABG. Heart is normal size. Lungs are clear. No effusions. No acute bony abnormality. IMPRESSION: No active disease. Electronically Signed   By: Rolm Baptise M.D.   On: 05/15/2019 10:46   [place] ASCITES (ABDOMEN LIMITED)  Result Date: 05/27/2019 CLINICAL DATA:  Cirrhosis of the liver. EXAM: LIMITED ABDOMEN ULTRASOUND FOR ASCITES TECHNIQUE: Limited ultrasound survey for ascites was performed in all four abdominal quadrants. COMPARISON:  Abdomen and pelvis CT dated 04/06/2019. FINDINGS: Sonographic survey of the 4 quadrants of the abdomen and midline abdomen demonstrated no  sonographically visible free peritoneal fluid. IMPRESSION: No free peritoneal fluid. Electronically Signed   By: Claudie Revering M.D.   On: 05/27/2019 16:50   US LIVER DOPPLER  Result Date: 05/29/2019 CLINICAL DATA:  History of TIPS placement on 04/08/2019 to treat bleeding esophageal varices. History of cirrhosis and portal hypertension. Admission currently for treatment of hepatic encephalopathy. Assessment of TIPS patency and internal velocities. EXAM: DUPLEX ULTRASOUND OF LIVER AND TIPS SHUNT TECHNIQUE: Color and duplex Doppler ultrasound was performed to evaluate the hepatic in-flow and out-flow vessels. COMPARISON:  TIPS procedure on 04/08/2019 and CT of the abdomen on 04/06/2019. FINDINGS: Portal Vein Velocities Main:  79 cm/sec No evidence of portal vein thrombus. Hepatopetal flow towards the liver. TIPS Stent Velocities Proximal:  177 cm/sec Mid:  129 Distal:  201 cm/sec No visible stenosis or thrombus. IVC: Patent.  Resent and patent with normal respiratory phasicity. Hepatic Vein Velocities Right:  Not able to be sample due to overlying TIPS shunt. Mid:  45 cm/sec Left:  31 cm/sec Splenic Vein: 49 Superior Mesenteric Vein: Not visualized. Hepatic Artery: 149 Ascites: Small amount of ascites visualized adjacent to the liver. Varices: None visualized. Other findings: Within the liver, a focal heterogeneous rounded area is noted in the right lobe measuring roughly 3.6 x 3.8 x 2.8 cm. This is suspicious for a hepatic mass. No evidence of biliary ductal dilatation. The liver demonstrates morphology consistent with cirrhosis. IMPRESSION: 1. Widely patent TIPS shunt with normal velocities. 2. Mass-like area in the right lobe of the liver suspicious for malignancy measuring up to  approximately 3.8 cm in diameter. Recommend correlation with serum AFP level and further imaging correlation with MRI of the abdomen with and without gadolinium. When the patient is clinically stable and able to follow directions and  hold their breath (preferably as an outpatient) further evaluation with dedicated abdominal MRI should be considered. Electronically Signed   By: Aletta Edouard M.D.   On: 05/29/2019 11:07    Assessment/Plan 1. Cirrhosis of liver with ascites, unspecified hepatic cirrhosis type ([place]) Reports bilateral lower extremity edema has improved.Has had > 4 loose stool per day keeping her awake at night. - Reduce furosemide from 40 mg tablet daily to 20 mg tablet daily.Hold if B/p < 110/60 and Notify provider's office. - check weight daily and notify provider for abrupt weight gain > 3 lbs in one day. - Reduce lactulose 45 mls  from four times daily to three times daily - furosemide (LASIX) 20 MG tablet; Take 1 tablet (20 mg total) by mouth daily. Hold if B/p < 110/60 - lactulose (CHRONULAC) 10 GM/15ML solution; Take 45 mLs (30 g total) by mouth 3 (three) times daily.  Dispense: 236 mL; Refill: 0 - continue to follow up with Dr.Jacobs.  2. Hypotension, unspecified hypotension type Reports B/p in the 100's/40's.Asymptomatic.Bilateral leg edema has improved.Advised to reduce Furosemide from 40 mg tablet to 20 mg tablet as above. - check Blood pressure daily.Notify provider if B/p < 100/60  - furosemide (LASIX) 20 MG tablet; Take 1 tablet (20 mg total) by mouth daily. Hold if B/p < 110/60 - Hypotension education information provided on AVS today.   Family/ staff Communication: Reviewed plan of care with patient and Husband verbalized understanding.Patient repeated back instruction.   Labs/tests ordered: Labs drawn by [facility] 06/10/2019 per patient.   Next Appointment: 4 months appointment with Dr.Reed for medical management of chronic issues.  Spent 17 minutes of non-face to face with patient and Husband.    Sandrea Hughs, NP

## 2019-06-13 ENCOUNTER — Encounter: Payer: Self-pay | Admitting: *Deleted

## 2019-06-13 ENCOUNTER — Ambulatory Visit
Admission: RE | Admit: 2019-06-13 | Discharge: 2019-06-13 | Disposition: A | Discharge status: Home / Self Care | Payer: Medicare Other | Source: Ambulatory Visit | Attending: Interventional Radiology | Admitting: Interventional Radiology

## 2019-06-13 ENCOUNTER — Encounter: Payer: Self-pay | Admitting: Internal Medicine

## 2019-06-13 DIAGNOSIS — K769 Liver disease, unspecified: Secondary | ICD-10-CM | POA: Diagnosis not present

## 2019-06-13 DIAGNOSIS — I8501 Esophageal varices with bleeding: Secondary | ICD-10-CM

## 2019-06-13 HISTORY — PX: IR RADIOLOGIST EVAL & MGMT: IMG5224

## 2019-06-13 NOTE — Progress Notes (Addendum)
Patient ID: [name], female   DOB: [birth year], 74 y.o.   MRN: [id number]       Chief Complaint: Patient was seen in consultation today for follow-up tips at the request of Starlena Beil  Referring Physician(s): Oretha Caprice  History of Present Illness: [name] is a 74 y.o. female with a history of NASH cirrhosis  03/28/2019 EGD with esophageal banding x14, subsequently persistent  retrosternal pain  04/06/2019 CT demonstrates portal venous thrombus, possible splenic infarct.  No evidence of liver mass. 04/07/2019 EGD demonstrating esophageal bleeding requiring epinephrine injection and clipping.  Persistent bleeding per rectum. 04/08/2019 TIPS creation with decrease in mean portosystemic gradient from 15 to 4 mmHg, coil embolization of esophageal varices, paracentesis removing 1.5 L  04/12/2019 discharged home, no further bleeding no encephalopathy. 05/15/2019 to ED with worsening encephalopathy, started on maximum lactulose, rifaximin 05/26/2019 to ED with acute metabolic encephalopathy secondary to hypercalcemia, ammonia level stable 05/29/2019 US liver Doppler shows patency of TIPS shunt, concern of right lobe mass 3.8 cm, new since previous imaging from November 05/30/2019 MR liver protocol shows 3 cm mixed signal right lobe mass lateral to the mid portion of the TIPS, indeterminate but biloma favored, some motion degradation on the study  Patient is doing fairly well at home.  Her daughter is concerned there may be some mild residual encephalopathy.  She is tolerating the lactulose.  No abdominal pain.  Appetite good.  No further GI bleeding.  Past Medical History:  Diagnosis Date  . Allergy   . Arthritis    neck  . Cataract    bilateral - MD monitoring cataracts  . CHF (congestive heart failure) ([place])   . Chronic kidney disease, stage I    DR OTTELIN  HX UTIS  . Cirrhosis ([place])   . Cramp of limb   . Diabetes mellitus   . Dysphagia, unspecified(787.20)   .  Dysuria   . Epistaxis   . GERD (gastroesophageal reflux disease)   . Heart murmur    NO CARDIOLOGIST  DX FOR YEARS ASYMPTOMATIC  . Hepatic encephalopathy ([place])   . Lumbago   . Neoplasm of uncertain behavior of skin   . Nonspecific elevation of levels of transaminase or lactic acid dehydrogenase (LDH)   . Osteoarthrosis, unspecified whether generalized or localized, unspecified site   . Other and unspecified hyperlipidemia    diet controlled  . Pain in joint, shoulder region   . Paresthesias 04/01/2015  . Postablative ovarian failure   . Trochanteric bursitis of left hip 12/15/2015  . Type 2 diabetes mellitus without complication ([place])   . Unspecified essential hypertension    no meds    Past Surgical History:  Procedure Laterality Date  . BREAST BIOPSY    . CARDIAC CATHETERIZATION N/A 01/27/2016   Procedure: Left Heart Cath and Coronary Angiography;  Surgeon: Belva Crome, MD;  Location: [facility];  Service: Cardiovascular;  Laterality: N/A;  . COLONOSCOPY  2012   Dr Lajoyce Corners.   . COLONOSCOPY WITH PROPOFOL N/A 07/07/2016   Procedure: COLONOSCOPY WITH PROPOFOL;  Surgeon: [name], MD;  Location: [facility];  Service: Endoscopy;  Laterality: N/A;  . CORONARY ARTERY BYPASS GRAFT N/A 01/28/2016   Procedure: CORONARY ARTERY BYPASS GRAFTING (CABG) x 3 USING RIGHT LEG GREATER SAPHENOUS VEIN GRAFT;  Surgeon: Melrose Nakayama, MD;  Location: [place];  Service: Open Heart Surgery;  Laterality: N/A;  . ENDOVEIN HARVEST OF GREATER SAPHENOUS VEIN Right 01/28/2016   Procedure: ENDOVEIN  HARVEST OF GREATER SAPHENOUS VEIN;  Surgeon: Melrose Nakayama, MD;  Location: [place];  Service: Open Heart Surgery;  Laterality: Right;  . ESOPHAGEAL BANDING  03/28/2019   Procedure: ESOPHAGEAL BANDING;  Surgeon: [name], MD;  Location: [facility];  Service: Endoscopy;;  . ESOPHAGEAL BANDING  04/07/2019   Procedure: ESOPHAGEAL BANDING;  Surgeon: Juanita Craver, MD;  Location: [facility] ENDOSCOPY;   Service: Endoscopy;;  . ESOPHAGOGASTRODUODENOSCOPY N/A 04/07/2019   Procedure: ESOPHAGOGASTRODUODENOSCOPY (EGD);  Surgeon: Juanita Craver, MD;  Location: [facility] ENDOSCOPY;  Service: Endoscopy;  Laterality: N/A;  . ESOPHAGOGASTRODUODENOSCOPY (EGD) WITH PROPOFOL N/A 07/07/2016   Procedure: ESOPHAGOGASTRODUODENOSCOPY (EGD) WITH PROPOFOL;  Surgeon: [name], MD;  [facility];  Service: Endoscopy;  Laterality: N/A;  . ESOPHAGOGASTRODUODENOSCOPY (EGD) WITH PROPOFOL N/A 03/28/2019   Procedure: ESOPHAGOGASTRODUODENOSCOPY (EGD) WITH PROPOFOL;  Surgeon: [name], MD;  [facility];  Service: Endoscopy;  Laterality: N/A;  . HEMOSTASIS CLIP PLACEMENT  04/07/2019   Procedure: HEMOSTASIS CLIP PLACEMENT;  Surgeon: Juanita Craver, MD;  Location: [place] [facility];  Service: Endoscopy;;  . IR ANGIOGRAM SELECTIVE EACH ADDITIONAL VESSEL  04/08/2019  . IR EMBO ART  VEN HEMORR LYMPH EXTRAV  INC GUIDE ROADMAPPING  04/08/2019  . IR PARACENTESIS  04/08/2019  . IR RADIOLOGIST EVAL & MGMT  06/13/2019  . IR TIPS  04/08/2019  . MAXIMUM ACCESS (MAS)POSTERIOR LUMBAR INTERBODY FUSION (PLIF) 1 LEVEL Left 06/10/2015   Procedure: FOR MAXIMUM ACCESS (MAS) POSTERIOR LUMBAR INTERBODY FUSION (PLIF) LUMBAR THREE-FOUR EXTRAFORAMINAL MICRODISCECTOMY LUMBAR FIVE-SACRAL ONE LEFT;  Surgeon: Eustace Moore, MD;  Location: [place] [facility];  Service: Neurosurgery;  Laterality: Left;  . RADIOLOGY WITH ANESTHESIA N/A 04/08/2019   Procedure: RADIOLOGY WITH ANESTHESIA;  Surgeon: Radiologist, Medication, MD;  Location: [place];  Service: Radiology;  Laterality: N/A;  . SCLEROTHERAPY  04/07/2019   Procedure: SCLEROTHERAPY;  Surgeon: Juanita Craver, MD;  Location: [facility] ENDOSCOPY;  Service: Endoscopy;;  . TEE WITHOUT CARDIOVERSION N/A 01/28/2016   Procedure: TRANSESOPHAGEAL ECHOCARDIOGRAM (TEE);  Surgeon: Melrose Nakayama, MD;  Location: [place];  Service: Open Heart Surgery;  Laterality: N/A;  . TUBAL LIGATION  1982   Dr Connye Burkitt  . UPPER  GASTROINTESTINAL ENDOSCOPY    . VAGINAL HYSTERECTOMY  1997   Dr Rande Lawman    Allergies: Kiwi extract, Tdap [tetanus-diphth-acell pertussis], Statins, Latex, and Tramadol  Medications: Prior to Admission medications   Medication Sig Start Date End Date Taking? Authorizing Provider  acetaminophen (TYLENOL) 500 MG tablet Take 500 mg by mouth at bedtime.     [provider]  BD PEN NEEDLE NANO U/F 32G X 4 MM MISC USE THREE TIMES DAILY AS DIRECTED 11/26/18   Reed, Tiffany L, DO  Biotin 10000 MCG TABS Take 10,000 mcg by mouth every morning.    [provider]  Cyanocobalamin (VITAMIN B 12 PO) Take 1,000 mcg by mouth daily.      [provider]  ezetimibe (ZETIA) 10 MG tablet TAKE 1 TABLET(10 MG) BY MOUTH DAILY 05/24/19   Reed, Tiffany L, DO  furosemide (LASIX) 20 MG tablet Take 1 tablet (20 mg total) by mouth daily. Hold if B/p < 110/60 06/12/19   Ngetich, Dinah C, NP  glucose blood test strip One Touch Ultra II strips. Use to test blood sugar three times daily. Dx: E11.65 06/01/17   Reed, Tiffany L, DO  insulin detemir (LEVEMIR) 100 UNIT/ML injection Inject 0.1 mLs (10 Units total) into the skin at bedtime. 06/01/19   Cherylann Ratel A, DO  Insulin Syringe-Needle U-100 (INSULIN SYRINGE 1CC/31GX5/16") 31G X 5/16" 1 ML MISC USE AS DIRECTED DAILY WITH LEVEMIR 07/27/18   Reed, Tiffany L, DO  JARDIANCE 25 MG TABS tablet Take 25 mg by mouth daily. 04/30/19   [provider]  lactulose (CHRONULAC) 10 GM/15ML solution Take 45 mLs (30 g total) by mouth 3 (three) times daily. 06/12/19   Ngetich, Dinah C, NP  loratadine (CLARITIN) 10 MG tablet Take 10 mg by mouth daily as needed for allergies.    [provider]  MAGNESIUM PO Take 500 mg by mouth daily.     [provider]  Multiple Vitamins-Minerals (MULTIVITAMIN WITH MINERALS) tablet Take 1 tablet by mouth daily.      [provider]  NOVOLOG FLEXPEN 100 UNIT/ML FlexPen Inject 5 units with breakfast, lunch,  and dinner. 06/01/19   Cherylann Ratel A, DO  ondansetron (ZOFRAN) 4 MG tablet Take 1 tablet (4 mg total) by mouth every 6 (six) hours as needed for nausea. 05/18/19   Desiree Hane, MD  pantoprazole (PROTONIX) 40 MG tablet Take 40 mg by mouth 2 (two) times daily.    [provider]  Polyethyl Glycol-Propyl Glycol (SYSTANE OP) Place 1 drop into both eyes daily.     [provider]  Probiotic Product (PROBIOTIC DAILY PO) Take 1 capsule by mouth daily. Digestive Advantage Probiotic    [provider]  rifaximin (XIFAXAN) 550 MG TABS tablet Take 1 tablet (550 mg total) by mouth 2 (two) times daily. 06/05/19   [name], MD  spironolactone (ALDACTONE) 100 MG tablet TAKE 1 TABLET(100 MG) BY MOUTH TWICE DAILY 06/06/19   [name], MD     Family History  Problem Relation Age of Onset  . Lung cancer Father   . Arthritis Sister   . Arthritis Brother   . Heart disease Maternal Grandmother   . Heart disease Maternal Grandfather   . Heart disease Paternal Grandmother   . Heart disease Paternal Grandfather   . Breast cancer Mother   . Liver cancer Brother   . Breast cancer Maternal Aunt   . Breast cancer Paternal Aunt   . Colon cancer Neg Hx   . Esophageal cancer Neg Hx   . Rectal cancer Neg Hx   . Stomach cancer Neg Hx     Social History   Socioeconomic History  . Marital status: Married    Spouse name: Not on file  . Number of children: 6  . Years of education: Not on file  . Highest education level: Not on file  Occupational History  . Occupation: retired  Tobacco Use  . Smoking status: Never Smoker  . Smokeless tobacco: Never Used  Substance and Sexual Activity  . Alcohol use: No  . Drug use: No  . Sexual activity: Yes    Partners: Male    Birth control/protection: Post-menopausal, Surgical    Comment: Hysterectomy  Other Topics Concern  . Not on file  Social History Narrative   She is retired she is married and lives with her husband   6  children   No alcohol or tobacco never smoker no drug use   Social Determinants of Radio broadcast assistant Strain:   . Difficulty of Paying Living Expenses: Not on file  Food Insecurity: No Food Insecurity  . Worried About Charity fundraiser in the Last Year: Never true  . Ran Out of Food in the Last Year: Never true  Transportation Needs: No  Transportation Needs  . Lack of Transportation (Medical): No  . Lack of Transportation (Non-Medical): No  Physical Activity:   . Days of Exercise per Week: Not on file  . Minutes of Exercise per Session: Not on file  Stress:   . Feeling of Stress : Not on file  Social Connections:   . Frequency of Communication with Friends and Family: Not on file  . Frequency of Social Gatherings with Friends and Family: Not on file  . Attends Religious Services: Not on file  . Active Member of Clubs or Organizations: Not on file  . Attends Archivist Meetings: Not on file  . Marital Status: Not on file    ECOG Status: 2 - Symptomatic, <50% confined to bed  Review of Systems: A 12 point ROS discussed and pertinent positives are indicated in the HPI above.  All other systems are negative.  Review of Systems  Vital Signs: BP (!) 104/48 (BP Location: Right Arm)   Pulse 71   Temp 98 F (36.7 C)   SpO2 98%   Physical Exam Constitutional: Oriented to person, place, and time. Well-developed and well-nourished. No distress.   HENT:  Head: Normocephalic and atraumatic.  Eyes: Conjunctivae and EOM are normal. Right eye exhibits no discharge. Left eye exhibits no discharge. No scleral icterus.  Neck: No JVD present.  Pulmonary/Chest: Effort normal. No stridor. No respiratory distress.  Abdomen: soft, non distended Neurological:  alert and oriented to person, place, and time.  Skin: Skin is warm and dry.  not diaphoretic.  Psychiatric:   normal mood and affect.   behavior is normal. Judgment and thought content normal.          Imaging: CT Head Wo Contrast  Result Date: 05/15/2019 CLINICAL DATA:  Altered mental status.  Confusion. EXAM: CT HEAD WITHOUT CONTRAST TECHNIQUE: Contiguous axial images were obtained from the base of the skull through the vertex without intravenous contrast. COMPARISON:  None. FINDINGS: Brain: The ventricles and sulci appear normal in size and contour. There is no intracranial mass, hemorrhage, extra-axial fluid collection, or midline shift. There is mild small vessel disease in the centra semiovale bilaterally. No acute appearing infarct is evident on this study. Vascular: There is no hyperdense vessel. There is calcification in each carotid siphon region. Skull: The bony calvarium appears intact. Sinuses/Orbits: There is mucosal thickening in several ethmoid air cells. Other visualized paranasal sinuses are clear. Orbits appear symmetric bilaterally. Other: Mastoid air cells are clear. IMPRESSION: Mild periventricular small vessel disease. No acute infarct. No mass or hemorrhage. There are foci of arterial vascular calcification. There is mucosal thickening in several ethmoid air cells. Electronically Signed   By: Lowella Grip III M.D.   On: 05/15/2019 11:37   MR LIVER W WO CONTRAST  Result Date: 05/30/2019 CLINICAL DATA:  Inpatient. Cirrhosis, admitted with a patent encephalopathy. History of TIPS placed 04/08/2019. Right liver mass on recent ultrasound. EXAM: MRI ABDOMEN WITHOUT AND WITH CONTRAST TECHNIQUE: Multiplanar multisequence MR imaging of the abdomen was performed both before and after the administration of intravenous contrast. CONTRAST:  5m GADAVIST GADOBUTROL 1 MMOL/ML IV SOLN COMPARISON:  05/29/2019 liver Doppler scan. 03/29/2019 CT abdomen/pelvis. 04/07/2017 MRI abdomen. FINDINGS: Scan significantly limited by motion degradation, particularly on the postcontrast sequences. Lower chest: Trace dependent bilateral pleural effusions. Hepatobiliary: Diffusely irregular liver surface  compatible with cirrhosis. No hepatic steatosis. There is a 3.0 x 2.9 cm right liver lobe masslike focus (series 5/image 8) with heterogeneous mild T2 hyperintensity, restricted  diffusion, mixed T1 signal with peripheral T1 hyperintensity and no compelling evidence of enhancement on the substantially motion degraded postcontrast sequences, located just lateral to the midportion of the TIPS and new since 04/06/2019 CT. No additional liver lesions. Numerous subcentimeter gallstones layering in the mildly distended gallbladder. Mild diffuse gallbladder wall thickening. No biliary ductal dilatation. Common bile duct diameter 6 mm. No appreciable choledocholithiasis, biliary strictures, biliary masses or beading on the motion degraded sequences. Pancreas: No pancreatic mass or duct dilation. Spleen: Normal size. No mass. Adrenals/Urinary Tract: Normal adrenals. No hydronephrosis. Normal kidneys with no renal mass. Stomach/Bowel: Normal non-distended stomach. Visualized small and large bowel is normal caliber, with no definite bowel wall thickening. Vascular/Lymphatic: Normal caliber abdominal aorta. Patent splenic and renal veins. Well-positioned TIPS extending proximally from the main portal vein to the junction of the right hepatic vein and IVC, which appears patent on the limited motion degraded postcontrast sequences. Moderate paraumbilical and gastrosplenic varices. Small lower esophageal varices. No pathologically enlarged lymph nodes in the abdomen. Other: Small volume ascites. Musculoskeletal: No aggressive appearing focal osseous lesions. Bilateral posterior spinal fusion hardware in the lower lumbar spine. IMPRESSION: 1. Limited motion degraded scan. 2. Cirrhosis. Right liver lobe 3.0 cm mass with mixed signal intensity, restricted diffusion and no compelling evidence of enhancement on this motion degraded scan, located just lateral to the midportion of the TIPS, new since most recent comparison study  (04/06/2019 CT). This mass remains indeterminate for neoplasm on the basis of this limited MRI study, however findings favor a biloma. Infected biloma cannot be excluded. Percutaneous biopsy could be considered. Short-term outpatient follow-up MRI abdomen without and with IV contrast recommended in 3 months. 3. TIPS appears well positioned and patent. 4. Small volume ascites. 5. Small lower esophageal, moderate paraumbilical and moderate gastrosplenic varices. 6. Trace dependent bilateral pleural effusions. Electronically Signed   By: Ilona Sorrel M.D.   On: 05/30/2019 20:15   DG Chest Port 1 View  Result Date: 05/26/2019 CLINICAL DATA:  Nausea, vomiting and weakness since last Tuesday. EXAM: PORTABLE CHEST 1 VIEW COMPARISON:  Chest x-ray 05/17/2019 FINDINGS: The cardiac silhouette, mediastinal and hilar contours are within normal limits. Mild tortuosity and calcification of the thoracic aorta. Stable surgical changes from bypass surgery. The lungs are clear of an acute process. No pleural effusions or pulmonary lesions. The bony thorax is intact. IMPRESSION: No acute cardiopulmonary findings. Electronically Signed   By: Marijo Sanes M.D.   On: 05/26/2019 16:02   DG CHEST PORT 1 VIEW  Result Date: 05/17/2019 CLINICAL DATA:  Mild cough EXAM: PORTABLE CHEST 1 VIEW COMPARISON:  05/15/2019 FINDINGS: Cardiac shadow is stable. Postsurgical changes are again seen. Tip shunt is noted in the upper abdomen. The lungs are well aerated without focal infiltrate or sizable effusion. No bony abnormality is noted. IMPRESSION: No acute abnormality seen. Electronically Signed   By: Inez Catalina M.D.   On: 05/17/2019 20:25   DG Chest Portable 1 View  Result Date: 05/15/2019 CLINICAL DATA:  Altered mental status EXAM: PORTABLE CHEST 1 VIEW COMPARISON:  04/01/2019 FINDINGS: Prior CABG. Heart is normal size. Lungs are clear. No effusions. No acute bony abnormality. IMPRESSION: No active disease. Electronically Signed   By:  Rolm Baptise M.D.   On: 05/15/2019 10:46   [place] ASCITES (ABDOMEN LIMITED)  Result Date: 05/27/2019 CLINICAL DATA:  Cirrhosis of the liver. EXAM: LIMITED ABDOMEN ULTRASOUND FOR ASCITES TECHNIQUE: Limited ultrasound survey for ascites was performed in all four abdominal quadrants. COMPARISON:  Abdomen  and pelvis CT dated 04/06/2019. FINDINGS: Sonographic survey of the 4 quadrants of the abdomen and midline abdomen demonstrated no sonographically visible free peritoneal fluid. IMPRESSION: No free peritoneal fluid. Electronically Signed   By: Claudie Revering M.D.   On: 05/27/2019 16:50   US LIVER DOPPLER  Result Date: 05/29/2019 CLINICAL DATA:  History of TIPS placement on 04/08/2019 to treat bleeding esophageal varices. History of cirrhosis and portal hypertension. Admission currently for treatment of hepatic encephalopathy. Assessment of TIPS patency and internal velocities. EXAM: DUPLEX ULTRASOUND OF LIVER AND TIPS SHUNT TECHNIQUE: Color and duplex Doppler ultrasound was performed to evaluate the hepatic in-flow and out-flow vessels. COMPARISON:  TIPS procedure on 04/08/2019 and CT of the abdomen on 04/06/2019. FINDINGS: Portal Vein Velocities Main:  79 cm/sec No evidence of portal vein thrombus. Hepatopetal flow towards the liver. TIPS Stent Velocities Proximal:  177 cm/sec Mid:  129 Distal:  201 cm/sec No visible stenosis or thrombus. IVC: Patent.  Resent and patent with normal respiratory phasicity. Hepatic Vein Velocities Right:  Not able to be sample due to overlying TIPS shunt. Mid:  45 cm/sec Left:  31 cm/sec Splenic Vein: 49 Superior Mesenteric Vein: Not visualized. Hepatic Artery: 149 Ascites: Small amount of ascites visualized adjacent to the liver. Varices: None visualized. Other findings: Within the liver, a focal heterogeneous rounded area is noted in the right lobe measuring roughly 3.6 x 3.8 x 2.8 cm. This is suspicious for a hepatic mass. No evidence of biliary ductal dilatation. The liver  demonstrates morphology consistent with cirrhosis. IMPRESSION: 1. Widely patent TIPS shunt with normal velocities. 2. Mass-like area in the right lobe of the liver suspicious for malignancy measuring up to approximately 3.8 cm in diameter. Recommend correlation with serum AFP level and further imaging correlation with MRI of the abdomen with and without gadolinium. When the patient is clinically stable and able to follow directions and hold their breath (preferably as an outpatient) further evaluation with dedicated abdominal MRI should be considered. Electronically Signed   By: Aletta Edouard M.D.   On: 05/29/2019 11:07   IR Radiologist Eval & Mgmt  Result Date: 06/13/2019 Please refer to notes tab for details about interventional procedure. (Op Note)   Labs:  CBC: Recent Labs    05/30/19 0424 05/31/19 0500 06/01/19 0239 06/11/19 0000  WBC 3.9* 3.5* 3.9* 5.0  HGB 12.9 12.4 12.2 13.2  HCT 38.8 36.3 35.4* 38  PLT 76* 77* 82* 125*    COAGS: Recent Labs    04/07/19 0350 04/17/19 1105 05/13/19 1436 05/15/19 1042  INR 1.2 1.5* 1.1 1.2    BMP: Recent Labs    05/29/19 0256 05/29/19 0256 05/30/19 0424 05/31/19 0500 06/01/19 0239 06/11/19 0000  NA 135   < > 139 137 140 137  K 3.1*   < > 4.0 3.6 3.6 4.3  CL 109   < > 111 110 116* 98*  CO2 20*   < > 20* 19* 18* 22  GLUCOSE 175*  --  192* 239* 239*  --   BUN 13   < > 13 15 12  24*  CALCIUM 9.2   < > 9.6 9.1 9.1 10.0  CREATININE 0.97   < > 0.88 0.93 0.82 0.9  GFRNONAA 58*   < > >60 >60 >60 69  GFRAA >60   < > >60 >60 >60 79   < > = values in this interval not displayed.    LIVER FUNCTION TESTS: Recent Labs    05/15/19 1042 05/15/19  1042 05/16/19 0719 05/16/19 0719 05/26/19 1332 05/26/19 1332 05/27/19 0352 05/28/19 0257 05/30/19 0424 05/31/19 0500 06/01/19 0239 06/11/19 0000  BILITOT 3.4*  --  4.2*  --  3.1*  --  2.2*  --   --   --   --   --   AST 62*   < > 58*  --  60*  --  53*  --   --   --   --  80*  ALT 45*    < > 43  --  45*  --  36  --   --   --   --  42*  ALKPHOS 127*   < > 104  --  190*  --  148*  --   --   --   --  259*  PROT 7.2  --  6.6  --  7.4  --  5.8*  --   --   --   --   --   ALBUMIN 3.0*   < > 2.6*   < > 2.8*   < > 2.2*   < > 2.1* 2.0* 1.9* 2.7*   < > = values in this interval not displayed.   Contains abnormal data Hepatic function panel Order: [id number] Status:  Final result Visible to patient:  Yes (MyChart) Next appt:  06/17/2019 at 10:30 AM in [facility], [facility])  Ref Range & Units 2 d ago  (06/11/19) 2 wk ago  (05/27/19) 2 wk ago  (05/26/19)  Alkaline Phosphatase 25 - 125 259Abnormal   148High  R  190High  R   ALT 7 - 35 42Abnormal   36 R  45High  R   AST 13 - 35 80Abnormal   53High  R  60High  R   Bilirubin, Total  1.6     Resulting Agency  [facility] [place] [facility]       Contains abnormal data Ammonia Order: [id number] Status:  Final result Visible to patient:  Yes (MyChart) Next appt:  06/17/2019 at 10:30 AM in [facility], [facility])  Ref Range & Units 2 wk ago 3 wk ago 4 wk ago  Ammonia 9 - 35 umol/L 41High   39High  CM  111High  CM   Comment: Performed at [facility], [address] [place], [place] [address]  Resulting Agency  [name]      Specimen Collected: 05/26/19 15:21 Last Resulted: 05/26/19 16:01           TUMOR MARKERS: Recent Labs    03/11/19 0932  AFPTM 2.3   MELD = 10 Assessment and Plan:  My impression is that the patient has done well post TIPS creation.  No more significant GI bleeding.  Hepatic encephalopathy appears moderately well controlled with lactulose.  Post TIPS ultrasound looks good, good flow velocities.  The 3 cm right lobe liver lesion near the TIPS is somewhat concerning, but there is no evidence of any mass on earlier CT of 04/06/2019 procedure, suggesting it may may well represent biloma perhaps related to TIPS creation.  I think a short-term follow-up  scan 3 months after this most recent MRI would be most appropriate to hopefully demonstrate resolution of the lesion.  I reviewed the findings with the patient and her daughter.  They had their questions answered.  They are satisfied with progress thus far.  We again reviewed the irreversible and generally  progressive nature of hepatic cirrhosis and the need for lifetime control of hepatic encephalopathy in the setting of diversion of blood flow through the TIPS.  We discussed about liver transplantation is the only cure for hepatic cirrhosis; they have not had a consultation with liver transplant service.  We will set her up for follow-up MR liver protocol with contrast on or around 08/28/2019.  Televisit to follow.  Patient and her daughter know to telephone in the interval should there be any questions or concerns regarding the TIPS, liver lesion, or associated issues.  Thank you for this interesting consult.  I greatly enjoyed meeting KODY VIGIL and look forward to participating in their care.  A copy of this report was sent to the requesting provider on this date.  Electronically Signed: Rickard Rhymes 06/13/2019, 1:03 PM   I spent a total of    25 Minutes in face to face in clinical consultation, greater than 50% of which was counseling/coordinating care for hepatic cirrhosis, post TIPS creation, with liver lesion.

## 2019-06-14 ENCOUNTER — Other Ambulatory Visit: Payer: Self-pay | Admitting: Pharmacy Technician

## 2019-06-14 ENCOUNTER — Telehealth: Payer: Medicare Other | Admitting: Family

## 2019-06-14 ENCOUNTER — Inpatient Hospital Stay: Payer: Medicare Other | Admitting: Hematology

## 2019-06-14 DIAGNOSIS — K7290 Hepatic failure, unspecified without coma: Secondary | ICD-10-CM | POA: Diagnosis not present

## 2019-06-14 DIAGNOSIS — K7469 Other cirrhosis of liver: Secondary | ICD-10-CM | POA: Diagnosis not present

## 2019-06-14 DIAGNOSIS — I81 Portal vein thrombosis: Secondary | ICD-10-CM | POA: Diagnosis not present

## 2019-06-14 DIAGNOSIS — I8501 Esophageal varices with bleeding: Secondary | ICD-10-CM | POA: Diagnosis not present

## 2019-06-14 DIAGNOSIS — K7581 Nonalcoholic steatohepatitis (NASH): Secondary | ICD-10-CM | POA: Diagnosis not present

## 2019-06-14 NOTE — Patient Outreach (Addendum)
[facility]) Care Management  06/14/2019  [name] 05/20/1945 [id number]    Successful call placed to patient regarding patient assistance application(s) for Xifaxan with [place] with BI and Levemir and Novolog with [name] , HIPAA identifiers verified.   Patient informed the applications were received. She informed she gave them to her son [name] who takes care of her medications, finances, filing out forms, etc. She informed [name] would be mailing the applications back to me.  Follow up:  Will route note to [place] for case closure if document(s) have not been received in the next 15 business days.  Delia Slatten P. Leyton Brownlee, [place] Management [phone]

## 2019-06-14 NOTE — Telephone Encounter (Signed)
Patient's daughter called to inquire about culture results. Per verbal from Marlowe Sax, NP we have a preliminary report and it does not appear that patient has a UTI yet this can not be confirmed until we receive the final report.  Patients daughter verbalized understanding

## 2019-06-14 NOTE — Telephone Encounter (Signed)
Urine preliminary report indicates 50,000 - 100,000 colonies of gram negative rods.Nitirites are negative.Awaiting for final culture and sensitivity.

## 2019-06-14 NOTE — Telephone Encounter (Signed)
Message routed to [employer], DO

## 2019-06-14 NOTE — Telephone Encounter (Signed)
Dinah I put the results in your chair, can you please look over this?

## 2019-06-17 ENCOUNTER — Ambulatory Visit: Payer: Self-pay | Admitting: Pharmacist

## 2019-06-18 ENCOUNTER — Encounter: Payer: Self-pay | Admitting: Gastroenterology

## 2019-06-18 ENCOUNTER — Encounter: Payer: Self-pay | Admitting: Internal Medicine

## 2019-06-18 ENCOUNTER — Other Ambulatory Visit: Payer: Self-pay

## 2019-06-18 ENCOUNTER — Ambulatory Visit (INDEPENDENT_AMBULATORY_CARE_PROVIDER_SITE_OTHER): Payer: Medicare Other | Admitting: Gastroenterology

## 2019-06-18 ENCOUNTER — Telehealth: Payer: Self-pay

## 2019-06-18 VITALS — BP 106/50 | HR 72 | Temp 98.3°F | Ht 63.0 in | Wt 139.0 lb

## 2019-06-18 DIAGNOSIS — R188 Other ascites: Secondary | ICD-10-CM | POA: Diagnosis not present

## 2019-06-18 DIAGNOSIS — K7460 Unspecified cirrhosis of liver: Secondary | ICD-10-CM

## 2019-06-18 DIAGNOSIS — I8500 Esophageal varices without bleeding: Secondary | ICD-10-CM

## 2019-06-18 DIAGNOSIS — I25118 Atherosclerotic heart disease of native coronary artery with other forms of angina pectoris: Secondary | ICD-10-CM

## 2019-06-18 DIAGNOSIS — R1310 Dysphagia, unspecified: Secondary | ICD-10-CM

## 2019-06-18 NOTE — Telephone Encounter (Signed)
Per Dr. Mariea Clonts , Culture did no grow at enough of either bacteria to warrant treatment with antibiotics

## 2019-06-18 NOTE — Progress Notes (Signed)
Review of pertinent gastrointestinal problems: 1. Personal history of adenomatous colon polyps. Colonoscopy Dr. [name] 2012 August was normal except for 2 subcentimeter polyps, one was a tubular adenoma. She was recommended to have recall colonoscopy at five-year interval. She had also had previous colonoscopies with Dr. Lajoyce Corners and found to have adenomas as well.Colonoscopy Dr. [name] 07/2016 one subCM TA, recall at 5 years 2. Cirrhosis: likely from Fatty liver  Paracentesis 11/20174 liters, elevated SAAG, no SBP, cytology neg for malignancy  imaging:03/2017 [place]: cirrhosis, splenomegaly, NEW partial (non-occlusive) PV thrombus, Gallstones and new "mild dilation of intra and extrahepatic bile ducts"; MRI 03/2017 biliary findings probably from extensive periportal fibrosis, small gallstones in GB, non-occlusive PV thrombus appears chronic.US 10/2017: gallstones, thick GB, cirrhosis without masses in liver.04/2018 ultrasound cirrhosis without focal hepatic lesions.  MRI January 2021 showed a new 3 cm mass that was "indeterminate for neoplasm".  Interventional radiology review felt this was possibly a biloma.  Follow-up MRI April 2021 arranged by interventional radiology  Most recent AFP:1/2021normal  MELD-NA;04/2018 labs : 10  07/2016 EGD: large varices, + portal gastropathy; started nadolol after discussion with cardiology(nadolol 73m once dialy); no recall necessary since on Nadolol.  EGD November 2020 showed persistent large varices, banded with ligating bands.  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  Very large varices causing dysphagia despite 80 of nadolol daily. EGD November 2020 placement of 14 variceal ligating bands. This was complicated by significant chest pain and dysphagia afterwards requiring hospital admission. Further  complicated by bleeding from band ulcer site and eventual TIPS placement November 2020.   HPI: This is a very pleasant 74year old woman who is here with her son today.  Her weight is down about 5 pounds since her last visit here in the office 6 weeks ago.  Since that visit she has been hospitalized twice for hepatic encephalopathy.   Blood work last week by her primary care physician shows a total bilirubin 1.6, alk phos 259, ALT 42, AST 80, platelets 125, hemoglobin 13.2, creatinine 0.9  She seems to be doing okay at home for the past 2 or 3 weeks.  Family checks in on her several times a day to make sure she is taking her medicines, eating well.  She obviously has baseline encephalopathy.  She is confused at times.  She has some memory difficulties, some difficulties with writing and reading.   Her son gives a lot of the history today says that they are very good about her lactulose dosing 45 mL 3 times daily and also her Xifaxan 1 pill twice daily.  They have not heard about patient assistance program.  Cirrhosis, encephalopathy  ROS: complete GI ROS as described in HPI, all other review negative.  Constitutional:  No unintentional weight loss   Past Medical History:  Diagnosis Date  . Allergy   . Arthritis    neck  . Cataract    bilateral - MD monitoring cataracts  . CHF (congestive heart failure) ([place]   . Chronic kidney disease, stage I    DR OTTELIN  HX UTIS  . Cirrhosis ([place]   . Cramp of limb   . Diabetes mellitus   . Dysphagia, unspecified(787.20)   . Dysuria   . Epistaxis   . GERD (gastroesophageal reflux disease)   . Heart murmur    NO CARDIOLOGIST  DX FOR YEARS ASYMPTOMATIC  .  Hepatic encephalopathy ([place])   . Lumbago   . Neoplasm of uncertain behavior of skin   . Nonspecific elevation of levels of transaminase or lactic acid dehydrogenase (LDH)   . Osteoarthrosis, unspecified whether generalized or localized, unspecified site   . Other and unspecified  hyperlipidemia    diet controlled  . Pain in joint, shoulder region   . Paresthesias 04/01/2015  . Postablative ovarian failure   . Trochanteric bursitis of left hip 12/15/2015  . Type 2 diabetes mellitus without complication ([place])   . Unspecified essential hypertension    no meds    Past Surgical History:  Procedure Laterality Date  . BREAST BIOPSY    . CARDIAC CATHETERIZATION N/A 01/27/2016   Procedure: Left Heart Cath and Coronary Angiography;  Surgeon: Belva Crome, MD;  Location: [facility] [place];  Service: Cardiovascular;  Laterality: N/A;  . COLONOSCOPY  2012   Dr Lajoyce Corners.   . COLONOSCOPY WITH PROPOFOL N/A 07/07/2016   Procedure: COLONOSCOPY WITH PROPOFOL;  Surgeon: [name], MD;  Location: [facility];  Service: Endoscopy;  Laterality: N/A;  . CORONARY ARTERY BYPASS GRAFT N/A 01/28/2016   Procedure: CORONARY ARTERY BYPASS GRAFTING (CABG) x 3 USING RIGHT LEG GREATER SAPHENOUS VEIN GRAFT;  Surgeon: Melrose Nakayama, MD;  Location: [place];  Service: Open Heart Surgery;  Laterality: N/A;  . ENDOVEIN HARVEST OF GREATER SAPHENOUS VEIN Right 01/28/2016   Procedure: ENDOVEIN HARVEST OF GREATER SAPHENOUS VEIN;  Surgeon: Melrose Nakayama, MD;  Location: [place];  Service: Open Heart Surgery;  Laterality: Right;  . ESOPHAGEAL BANDING  03/28/2019   Procedure: ESOPHAGEAL BANDING;  Surgeon: [name], MD;  [facility];  Service: Endoscopy;;  . ESOPHAGEAL BANDING  04/07/2019   Procedure: ESOPHAGEAL BANDING;  Surgeon: Juanita Craver, MD;  Location: [facility] ENDOSCOPY;  Service: Endoscopy;;  . ESOPHAGOGASTRODUODENOSCOPY N/A 04/07/2019   Procedure: ESOPHAGOGASTRODUODENOSCOPY (EGD);  Surgeon: Juanita Craver, MD;  Location: [facility] ENDOSCOPY;  Service: Endoscopy;  Laterality: N/A;  . ESOPHAGOGASTRODUODENOSCOPY (EGD) WITH PROPOFOL N/A 07/07/2016   Procedure: ESOPHAGOGASTRODUODENOSCOPY (EGD) WITH PROPOFOL;  Surgeon: [name], MD;  [facility];  Service: Endoscopy;   Laterality: N/A;  . ESOPHAGOGASTRODUODENOSCOPY (EGD) WITH PROPOFOL N/A 03/28/2019   Procedure: ESOPHAGOGASTRODUODENOSCOPY (EGD) WITH PROPOFOL;  Surgeon: [name], MD;  [facility];  Service: Endoscopy;  Laterality: N/A;  . HEMOSTASIS CLIP PLACEMENT  04/07/2019   Procedure: HEMOSTASIS CLIP PLACEMENT;  Surgeon: Juanita Craver, MD;  Location: [place] ENDOSCOPY;  Service: Endoscopy;;  . IR ANGIOGRAM SELECTIVE EACH ADDITIONAL VESSEL  04/08/2019  . IR EMBO ART  VEN HEMORR LYMPH EXTRAV  INC GUIDE ROADMAPPING  04/08/2019  . IR PARACENTESIS  04/08/2019  . IR RADIOLOGIST EVAL & MGMT  06/13/2019  . IR TIPS  04/08/2019  . MAXIMUM ACCESS (MAS)POSTERIOR LUMBAR INTERBODY FUSION (PLIF) 1 LEVEL Left 06/10/2015   Procedure: FOR MAXIMUM ACCESS (MAS) POSTERIOR LUMBAR INTERBODY FUSION (PLIF) LUMBAR THREE-FOUR EXTRAFORAMINAL MICRODISCECTOMY LUMBAR FIVE-SACRAL ONE LEFT;  Surgeon: Eustace Moore, MD;  Location: [place] [facility]URO [facility]S;  Service: Neurosurgery;  Laterality: Left;  . RADIOLOGY WITH ANESTHESIA N/A 04/08/2019   Procedure: RADIOLOGY WITH ANESTHESIA;  Surgeon: Radiologist, Medication, MD;  Location: [place];  Service: Radiology;  Laterality: N/A;  . SCLEROTHERAPY  04/07/2019   Procedure: SCLEROTHERAPY;  Surgeon: Juanita Craver, MD;  Location: [facility] ENDOSCOPY;  Service: Endoscopy;;  . TEE WITHOUT CARDIOVERSION N/A 01/28/2016   Procedure: TRANSESOPHAGEAL ECHOCARDIOGRAM (TEE);  Surgeon: Melrose Nakayama, MD;  Location: [place];  Service: Open  Heart Surgery;  Laterality: N/A;  . TUBAL LIGATION  1982   Dr Connye Burkitt  . UPPER GASTROINTESTINAL ENDOSCOPY    . VAGINAL HYSTERECTOMY  1997   Dr Rande Lawman    Current Outpatient Medications  Medication Sig Dispense Refill  . acetaminophen (TYLENOL) 500 MG tablet Take 500 mg by mouth at bedtime.     . BD PEN NEEDLE NANO U/F 32G X 4 MM MISC USE THREE TIMES DAILY AS DIRECTED 100 each 6  . Biotin 10000 MCG TABS Take 10,000 mcg by mouth every morning.    . Cyanocobalamin (VITAMIN B 12  PO) Take 1,000 mcg by mouth daily.      [name] Kitchen ezetimibe (ZETIA) 10 MG tablet TAKE 1 TABLET(10 MG) BY MOUTH DAILY 90 tablet 1  . furosemide (LASIX) 20 MG tablet Take 1 tablet (20 mg total) by mouth daily. Hold if B/p < 110/60    . glucose blood test strip One Touch Ultra II strips. Use to test blood sugar three times daily. Dx: E11.65 300 each 3  . insulin detemir (LEVEMIR) 100 UNIT/ML injection Inject 0.1 mLs (10 Units total) into the skin at bedtime. 10 mL 11  . Insulin Syringe-Needle U-100 (INSULIN SYRINGE 1CC/31GX5/16") 31G X 5/16" 1 ML MISC USE AS DIRECTED DAILY WITH LEVEMIR 100 each 2  . JARDIANCE 25 MG TABS tablet Take 25 mg by mouth daily.    [name] Kitchen lactulose (CHRONULAC) 10 GM/15ML solution Take 45 mLs (30 g total) by mouth 3 (three) times daily. 236 mL 0  . loratadine (CLARITIN) 10 MG tablet Take 10 mg by mouth daily as needed for allergies.    [name] Kitchen MAGNESIUM PO Take 500 mg by mouth daily.     . Multiple Vitamins-Minerals (MULTIVITAMIN WITH MINERALS) tablet Take 1 tablet by mouth daily.      [name] Kitchen NOVOLOG FLEXPEN 100 UNIT/ML FlexPen Inject 5 units with breakfast, lunch, and dinner. 15 mL 3  . ondansetron (ZOFRAN) 4 MG tablet Take 1 tablet (4 mg total) by mouth every 6 (six) hours as needed for nausea. 20 tablet 0  . pantoprazole (PROTONIX) 40 MG tablet Take 40 mg by mouth 2 (two) times daily.    Vladimir Faster Glycol-Propyl Glycol (SYSTANE OP) Place 1 drop into both eyes daily.     . Probiotic Product (PROBIOTIC DAILY PO) Take 1 capsule by mouth daily. Digestive Advantage Probiotic    . rifaximin (XIFAXAN) 550 MG TABS tablet Take 1 tablet (550 mg total) by mouth 2 (two) times daily. 60 tablet 1   No current facility-administered medications for this visit.    Allergies as of 06/18/2019 - Review Complete 06/18/2019  Allergen Reaction Noted  . Kiwi extract Anaphylaxis 05/28/2015  . Tdap [tetanus-diphth-acell pertussis] Swelling and Other (See Comments) 03/27/2013  . Statins Other (See Comments)  05/08/2016  . Latex Itching, Dermatitis, and Rash 12/22/2010  . Tramadol Nausea And Vomiting 05/28/2015    Family History  Problem Relation Age of Onset  . Lung cancer Father   . Arthritis Sister   . Arthritis Brother   . Liver cancer Brother   . Heart disease Maternal Grandmother   . Heart disease Maternal Grandfather   . Heart disease Paternal Grandmother   . Heart disease Paternal Grandfather   . Breast cancer Mother   . Other Daughter        house fire  . Breast cancer Maternal Aunt   . Breast cancer Paternal Aunt   . Colon cancer Neg Hx   . Esophageal cancer Neg  Hx   . Rectal cancer Neg Hx   . Stomach cancer Neg Hx     Social History   Socioeconomic History  . Marital status: Married    Spouse name: Not on file  . Number of children: 6  . Years of education: Not on file  . Highest education level: Not on file  Occupational History  . Occupation: retired  Tobacco Use  . Smoking status: Never Smoker  . Smokeless tobacco: Never Used  Substance and Sexual Activity  . Alcohol use: No  . Drug use: No  . Sexual activity: Yes    Partners: Male    Birth control/protection: Post-menopausal, Surgical    Comment: Hysterectomy  Other Topics Concern  . Not on file  Social History Narrative   She is retired she is married and lives with her husband   6 children   No alcohol or tobacco never smoker no drug use   Social Determinants of Radio broadcast assistant Strain:   . Difficulty of Paying Living Expenses: Not on file  Food Insecurity: No Food Insecurity  . Worried About Charity fundraiser in the Last Year: Never true  . Ran Out of Food in the Last Year: Never true  Transportation Needs: No Transportation Needs  . Lack of Transportation (Medical): No  . Lack of Transportation (Non-Medical): No  Physical Activity:   . Days of Exercise per Week: Not on file  . Minutes of Exercise per Session: Not on file  Stress:   . Feeling of Stress : Not on file  Social  Connections:   . Frequency of Communication with Friends and Family: Not on file  . Frequency of Social Gatherings with Friends and Family: Not on file  . Attends Religious Services: Not on file  . Active Member of Clubs or Organizations: Not on file  . Attends Archivist Meetings: Not on file  . Marital Status: Not on file  Intimate Partner Violence:   . Fear of Current or Ex-Partner: Not on file  . Emotionally Abused: Not on file  . Physically Abused: Not on file  . Sexually Abused: Not on file     Physical Exam: Temp 98.3 F (36.8 C)   Ht 5' 3"  (1.6 m)   Wt 139 lb (63 kg)   BMI 24.62 kg/m  Constitutional: generally well-appearing Psychiatric: alert and oriented x3 Abdomen: soft, nontender, nondistended, no obvious ascites, no peritoneal signs, normal bowel sounds No peripheral edema noted in lower extremities  Assessment and plan: 74 y.o. female with decompensated cirrhosis, likely burned-out [name]  Think her family is doing pretty credibly well keeping her on her medicines checking in on her daily, making sure she is eating and getting her to her appointments.  She does have a baseline moderate encephalopathy.  Xifaxan twice daily and lactulose 45 mL 3 times daily seem to be controlling it well enough to keep her from requiring hospitalization again.  Her son asked if she is going to be getting better however I am not really sure that she is, this may be her baseline going forward.    New, indeterminate 3 cm liver lesion.  Seems unlikely this is hepatoma.  Interventional radiology agrees overall.  Planning for soon repeat MRI April 2021, already ordered by interventional radiology.  She will return to see me in 2 months.  Please see the "Patient Instructions" section for addition details about the plan.  Owens Loffler, MD [facility] 06/18/2019, 10:59 AM  Total time on date of encounter was 35 minutes (this included time spent preparing to see the  patient reviewing records; obtaining and/or reviewing separately obtained history; performing a medically appropriate exam and/or evaluation; counseling and educating the patient and family if present; ordering medications, tests or procedures if applicable; and documenting clinical information in the health record).

## 2019-06-18 NOTE — Patient Instructions (Addendum)
If you are age 73 or older, your body mass index should be between 23-30. Your Body mass index is 24.62 kg/m. If this is out of the aforementioned range listed, please consider follow up with your Primary Care Provider.  If you are age 61 or younger, your body mass index should be between 19-25. Your Body mass index is 24.62 kg/m. If this is out of the aformentioned range listed, please consider follow up with your Primary Care Provider.   We will follow-up on your patient assistance with [facility] regarding your Xifaxan and contact you later.   Dr.Jacobs would like to see you back in office in 2 months. We will contact you later to schedule follow-up appointment.   Thank you,   Dr [name].

## 2019-06-19 ENCOUNTER — Other Ambulatory Visit: Payer: Self-pay | Admitting: Pharmacist

## 2019-06-19 ENCOUNTER — Other Ambulatory Visit: Payer: Self-pay | Admitting: *Deleted

## 2019-06-19 ENCOUNTER — Other Ambulatory Visit: Payer: Self-pay | Admitting: Internal Medicine

## 2019-06-19 DIAGNOSIS — I81 Portal vein thrombosis: Secondary | ICD-10-CM | POA: Diagnosis not present

## 2019-06-19 DIAGNOSIS — K7581 Nonalcoholic steatohepatitis (NASH): Secondary | ICD-10-CM | POA: Diagnosis not present

## 2019-06-19 DIAGNOSIS — K7290 Hepatic failure, unspecified without coma: Secondary | ICD-10-CM | POA: Diagnosis not present

## 2019-06-19 DIAGNOSIS — E1169 Type 2 diabetes mellitus with other specified complication: Secondary | ICD-10-CM

## 2019-06-19 DIAGNOSIS — I8501 Esophageal varices with bleeding: Secondary | ICD-10-CM | POA: Diagnosis not present

## 2019-06-19 DIAGNOSIS — K7469 Other cirrhosis of liver: Secondary | ICD-10-CM | POA: Diagnosis not present

## 2019-06-19 DIAGNOSIS — E785 Hyperlipidemia, unspecified: Secondary | ICD-10-CM

## 2019-06-19 NOTE — Patient Outreach (Signed)
[facility]) Care Management  06/19/2019  [name] 10-16-1945 [id number]   Patient's son, [name] called to inquire about his mother's patient assistance with Xifaxan. HIPAA identifiers were obtained.  [name] said he asked Dr. Eugenia Pancoast office about the status of patient's application and was told things were in process. He also received an application in the mail from [place] just like what he sent him earlier this month.  Patient's application has not been received yet. [name] said it was mailed back last Friday.    A note will be sent to [facility], CPhT to be on the "look out" for the patient's applications.   Plan: Reach back out to the patient within the next few weeks.  Elayne Guerin, PharmD, [place] Clinical Pharmacist [phone]

## 2019-06-19 NOTE — Patient Outreach (Signed)
[facility]) Care Management  06/19/2019  [name] [birth year] [id number]   Call placed to member's son to follow up on member's recovery from hospitalization.  He report she has been stable, continue to have some slight confusion but much better then prior to hospitalization.  Was seen by GI on 2/9, was told that member's current mental status may now be her baseline.  He also report that member is not a candidate for a liver transplant as physicians feel she may not survive the surgery.  Plan is to continue symptom management with Lactulose and Rifaximin and to repeat scan/MRI within the next couple months.  He report completing paperwork for medication assistance but has received another application, confused as to if he should complete additional forms.  He will contact [facility] pharmacist to clarify.    He report member has gotten stronger with working with PT, has cane available when needed.  She has started performing more duties around the home as well.  Report her appetite has remained stable, no nausea/vomiting.  He and his siblings are working together to care for member.  Denies any urgent concerns, advised to contact this care manager with questions.  Will follow up within the next month.  THN CM Care Plan Problem One     Most Recent Value  Care Plan Problem One  Risk for readmission related to bleeding varicies as evidenced by Recent hospitalizations  Role Documenting the Problem One  Care Management [place] for Problem One  Active  THN Long Term Goal   Member will not be readmitted to hospital within the next 31 days  THN Long Term Goal Start Date  06/03/19 [Not met, goal reset]  Interventions for Problem One Long Term Goal  Reviewed plan of care with son (plan for follow up scan and reassessment with GI) and discussed importance of following, attempting to decrease readmission  THN CM Short Term Goal #1   Member will report taking medicatiosn as  prescribed over the next 2 weeks  THN CM Short Term Goal #1 Start Date  05/20/19  [facility] CM Short Term Goal #1 Met Date  06/19/19  THN CM Short Term Goal #2   Member will report increased strength over the next 2 weeks  THN CM Short Term Goal #2 Start Date  06/03/19 [Date reset]  THN CM Short Term Goal #2 Met Date  06/19/19  [facility] CM Short Term Goal #3  Family will report no episodes of recurrent confusion over the next 4 weeks  THN CM Short Term Goal #3 Start Date  06/19/19  Interventions for Short Tern Goal #3  Discussed with son the importance of taking Rifaximin and lactulose in effort to decrease ammonia levels and bouts of confusion.  Advised to contact pharmacist regarding medication assistance application     [name], Therapist, sports, MSN [place] Manager [phone]

## 2019-06-20 ENCOUNTER — Other Ambulatory Visit: Payer: Self-pay | Admitting: Pharmacy Technician

## 2019-06-20 NOTE — Patient Outreach (Signed)
[facility]) Care Management  06/20/2019  [name] 02-Jul-1945 [id number]   Received both patient and provider portion(s) of patient assistance application(s) for Xifaxan, Jardiance, Levemir and Novolog. Faxed completed application and required documents into [facility], BI and [name] respectively.  Will follow up with company(ies) in 7-14 business days to check status of application(s).  Delancey Moraes P. Katena Petitjean, [place] Management [phone]

## 2019-06-24 ENCOUNTER — Telehealth: Payer: Self-pay | Admitting: Gastroenterology

## 2019-06-24 ENCOUNTER — Other Ambulatory Visit: Payer: Self-pay

## 2019-06-24 DIAGNOSIS — H04123 Dry eye syndrome of bilateral lacrimal glands: Secondary | ICD-10-CM | POA: Diagnosis not present

## 2019-06-24 DIAGNOSIS — H2513 Age-related nuclear cataract, bilateral: Secondary | ICD-10-CM | POA: Diagnosis not present

## 2019-06-24 DIAGNOSIS — H524 Presbyopia: Secondary | ICD-10-CM | POA: Diagnosis not present

## 2019-06-24 DIAGNOSIS — R188 Other ascites: Secondary | ICD-10-CM

## 2019-06-24 DIAGNOSIS — E119 Type 2 diabetes mellitus without complications: Secondary | ICD-10-CM | POA: Diagnosis not present

## 2019-06-24 DIAGNOSIS — H35373 Puckering of macula, bilateral: Secondary | ICD-10-CM | POA: Diagnosis not present

## 2019-06-24 DIAGNOSIS — K7460 Unspecified cirrhosis of liver: Secondary | ICD-10-CM

## 2019-06-24 MED ORDER — LACTULOSE 10 GM/15ML PO SOLN: 30.0000 g | Freq: Three times a day (TID) | ORAL | 0 refills | Status: DC

## 2019-06-24 MED ORDER — LACTULOSE 10 GM/15ML PO SOLN: 30.0000 g | Freq: Three times a day (TID) | ORAL | 1 refills | Status: AC

## 2019-06-24 NOTE — Telephone Encounter (Signed)
Refill sent with one refill as patient requested.

## 2019-06-26 ENCOUNTER — Other Ambulatory Visit: Payer: Self-pay | Admitting: Pharmacy Technician

## 2019-06-26 NOTE — Patient Outreach (Signed)
[facility]) Care Management  06/26/2019  [name] 03-20-1946 [id number]    Care coordination call placed to [place] in regards to patient's application for Levemir and Novolog.  Spoke to [name] who informed patient was APPROVED 06/19/2019-04/07/2020. [name] informed patien would receive a 4 month supply of both medications delivered to the provider;s office in the next 10-14 business days.  Will follow up with patient in 10-14 business days to inquire medication was received.  [name] P. Nakul Avino, [place] Management [phone]

## 2019-06-28 ENCOUNTER — Ambulatory Visit: Payer: Self-pay | Admitting: Pharmacist

## 2019-06-28 ENCOUNTER — Other Ambulatory Visit: Payer: Self-pay | Admitting: Internal Medicine

## 2019-06-28 DIAGNOSIS — I81 Portal vein thrombosis: Secondary | ICD-10-CM | POA: Diagnosis not present

## 2019-06-28 DIAGNOSIS — K7290 Hepatic failure, unspecified without coma: Secondary | ICD-10-CM | POA: Diagnosis not present

## 2019-06-28 DIAGNOSIS — K7469 Other cirrhosis of liver: Secondary | ICD-10-CM | POA: Diagnosis not present

## 2019-06-28 DIAGNOSIS — K7581 Nonalcoholic steatohepatitis (NASH): Secondary | ICD-10-CM | POA: Diagnosis not present

## 2019-06-28 DIAGNOSIS — I8501 Esophageal varices with bleeding: Secondary | ICD-10-CM | POA: Diagnosis not present

## 2019-07-05 ENCOUNTER — Other Ambulatory Visit: Payer: Self-pay | Admitting: Pharmacy Technician

## 2019-07-05 ENCOUNTER — Telehealth: Payer: Self-pay | Admitting: Pharmacist

## 2019-07-05 DIAGNOSIS — K7290 Hepatic failure, unspecified without coma: Secondary | ICD-10-CM | POA: Diagnosis not present

## 2019-07-05 DIAGNOSIS — I8501 Esophageal varices with bleeding: Secondary | ICD-10-CM | POA: Diagnosis not present

## 2019-07-05 DIAGNOSIS — K7581 Nonalcoholic steatohepatitis (NASH): Secondary | ICD-10-CM | POA: Diagnosis not present

## 2019-07-05 DIAGNOSIS — I81 Portal vein thrombosis: Secondary | ICD-10-CM | POA: Diagnosis not present

## 2019-07-05 DIAGNOSIS — K7469 Other cirrhosis of liver: Secondary | ICD-10-CM | POA: Diagnosis not present

## 2019-07-05 NOTE — Patient Outreach (Signed)
[facility]) Care Management  07/05/2019  [name] May 25, 1945 [id number]   2 care coordination calls called, the first one to [facility] in regards to Xifaxan application and the other to [facility] in regards to [place] application.  Spoke to [place] at [facility]. Patient APPROVED  06/21/2019-12/31/202. She informed a 90 days supply of medication was delivered on 06/29/2019.  Spoke to [place] at [employer]. Patient is over income for the program. The decision can be appealed but patient would need to provide the out of pocket cost for Jardiance and if income has changed she will need to send in 2020 tax form 1040.  Will outreach [facility] Denyse Amass to inform and discuss next steps with BI application.  [name], [place] Management [phone]

## 2019-07-05 NOTE — Telephone Encounter (Signed)
-----   Message from Jason Fila, CPhT sent at 07/05/2019  3:12 PM EST ----- Please inform patient she was denied with BI due to an income on 1040 as $100, 443. Patient said income had changed on the form but if so then they will need 2020 tax return 1040 and proof of copay for Jardiance. She was approved for everything else. If not appealing then remove me from care team please. Thanks, JS

## 2019-07-05 NOTE — Patient Outreach (Signed)
[place] [facility]) Care Management  07/05/2019  [name] [birth year] [id number]   Called patient's son [name]. HIPAA identifiers were obtained. Explained that [employer]'s Patient Assistance Program has denied the patient for now due to her being over income. What would be needed to dispute this would be a recent tax return. [name] said he would speak with his father and get back to me as his father does the taxes for the patient.   [name] was instructed to call me back when they get the 2020 taxes done so we can send the information to BI.  He communicated understanding.  [name] said patient had been approved to received [place] and with [name].  Plan: Follow up with patient in 2 months. Close case at that time if tax return has not been received.  Elayne Guerin, PharmD, [place] Clinical Pharmacist [phone]

## 2019-07-08 ENCOUNTER — Other Ambulatory Visit: Payer: Self-pay | Admitting: Pharmacy Technician

## 2019-07-08 ENCOUNTER — Other Ambulatory Visit: Payer: Self-pay | Admitting: Internal Medicine

## 2019-07-08 DIAGNOSIS — K7581 Nonalcoholic steatohepatitis (NASH): Secondary | ICD-10-CM | POA: Diagnosis not present

## 2019-07-08 DIAGNOSIS — E1165 Type 2 diabetes mellitus with hyperglycemia: Secondary | ICD-10-CM

## 2019-07-08 DIAGNOSIS — I8501 Esophageal varices with bleeding: Secondary | ICD-10-CM | POA: Diagnosis not present

## 2019-07-08 DIAGNOSIS — K7290 Hepatic failure, unspecified without coma: Secondary | ICD-10-CM | POA: Diagnosis not present

## 2019-07-08 DIAGNOSIS — K7469 Other cirrhosis of liver: Secondary | ICD-10-CM | POA: Diagnosis not present

## 2019-07-08 DIAGNOSIS — I81 Portal vein thrombosis: Secondary | ICD-10-CM | POA: Diagnosis not present

## 2019-07-08 DIAGNOSIS — Z794 Long term (current) use of insulin: Secondary | ICD-10-CM

## 2019-07-08 NOTE — Patient Outreach (Signed)
[place] [facility]) Care Management  07/08/2019  [name] 1946-01-17 [id number]   ADDENDUM  Care coordination call placed to [place] in regards to patient's application for Levemir and Novolog.  Spoke to [place] who informed the medication has not shipped. She does not know when it will ship. Inquired if her supervisor could provide additional information and she informed no one in the company know when the medication would ship. She informed shipping was delayed due to adverse weather and they are working hard to remedy the situation. She informed deliveries are typically scheduled Monday thru Thursday. She informed patient could call [phone] to talk to a [employer] representative to inquire about receiving a voucher for a one month supply of free [name] product that patient could pick up at the local pharmacy provided she has not used one this calendar year.  Will outreach patient with this information.  [name] [name], [name]  [phone]

## 2019-07-08 NOTE — Patient Outreach (Signed)
[place] [facility]) Care Management  07/08/2019  CHAMIKA CUNANAN April 14, 1946 [id number]   Successful call placed to patient regarding patient assistance medication delivery of Xifaxan from [facility] and [place] from [name], HIPAA identifiers verified.   Spoke to patient's son [name], HIPAA confirmed and verified.  [name] informed his mother received the 90 days supply of Xifaxan. Discussed refill procedure with [name]. [name] verbalized understanding and confirmed having the phone number to [place] to place the refill request.  [name] informed his mother has not received the Levemir and Novolog. Informed [name] that it would be coming to the provider's office and to be expecting a call from them. Discussed refill procedure with [name] which entails that he notify the provider when his mother has about a 2-3 week supply so they can send the script into the patient assistance company.Also informed [name] that the provider's office should also receive a fax request. [name] verbalized understanding. Informed [name] I would follow up with [name] about the shipment of [place].  Will [employer] and call patient's son back.  Eily Louvier P. Sumaya Riedesel, [place]  [phone]

## 2019-07-08 NOTE — Patient Outreach (Signed)
[place] [facility]) Care Management  07/08/2019  CLARANN HELVEY [birth year] [id number]  Successful outreach call placed to patient's son [name] in regards to [employer] for [employer].  Spoke to [place], [employer] verified.  Informed [name] that medication is going to be shipped. However, [name] was unsure when this would occur but hopefully it should be at the provider's office in the next week. [name] informed that his mother currently has an adequate supply of medication. Informed [name] that I would check back some time next week to inquire if medication had arrived. [name] was agreeable to this plan.  Will follow up with patient and/or Novo Nordisk in 7-10 business days to inquire if medication was delivered.  Marcy Sookdeo P. Precious Gilchrest, [place]  [phone]

## 2019-07-12 ENCOUNTER — Other Ambulatory Visit: Payer: Self-pay | Admitting: *Deleted

## 2019-07-12 DIAGNOSIS — M1990 Unspecified osteoarthritis, unspecified site: Secondary | ICD-10-CM | POA: Diagnosis not present

## 2019-07-12 DIAGNOSIS — E785 Hyperlipidemia, unspecified: Secondary | ICD-10-CM | POA: Diagnosis not present

## 2019-07-12 DIAGNOSIS — K7290 Hepatic failure, unspecified without coma: Secondary | ICD-10-CM | POA: Diagnosis not present

## 2019-07-12 DIAGNOSIS — Z9181 History of falling: Secondary | ICD-10-CM | POA: Diagnosis not present

## 2019-07-12 DIAGNOSIS — K3189 Other diseases of stomach and duodenum: Secondary | ICD-10-CM | POA: Diagnosis not present

## 2019-07-12 DIAGNOSIS — K7469 Other cirrhosis of liver: Secondary | ICD-10-CM | POA: Diagnosis not present

## 2019-07-12 DIAGNOSIS — K7581 Nonalcoholic steatohepatitis (NASH): Secondary | ICD-10-CM | POA: Diagnosis not present

## 2019-07-12 DIAGNOSIS — Z794 Long term (current) use of insulin: Secondary | ICD-10-CM | POA: Diagnosis not present

## 2019-07-12 DIAGNOSIS — K219 Gastro-esophageal reflux disease without esophagitis: Secondary | ICD-10-CM | POA: Diagnosis not present

## 2019-07-12 DIAGNOSIS — I81 Portal vein thrombosis: Secondary | ICD-10-CM | POA: Diagnosis not present

## 2019-07-12 DIAGNOSIS — I2510 Atherosclerotic heart disease of native coronary artery without angina pectoris: Secondary | ICD-10-CM | POA: Diagnosis not present

## 2019-07-12 DIAGNOSIS — E1169 Type 2 diabetes mellitus with other specified complication: Secondary | ICD-10-CM

## 2019-07-12 DIAGNOSIS — I509 Heart failure, unspecified: Secondary | ICD-10-CM | POA: Diagnosis not present

## 2019-07-12 DIAGNOSIS — E1136 Type 2 diabetes mellitus with diabetic cataract: Secondary | ICD-10-CM | POA: Diagnosis not present

## 2019-07-12 DIAGNOSIS — Z951 Presence of aortocoronary bypass graft: Secondary | ICD-10-CM | POA: Diagnosis not present

## 2019-07-12 DIAGNOSIS — D696 Thrombocytopenia, unspecified: Secondary | ICD-10-CM | POA: Diagnosis not present

## 2019-07-12 DIAGNOSIS — E1122 Type 2 diabetes mellitus with diabetic chronic kidney disease: Secondary | ICD-10-CM | POA: Diagnosis not present

## 2019-07-12 DIAGNOSIS — E43 Unspecified severe protein-calorie malnutrition: Secondary | ICD-10-CM | POA: Diagnosis not present

## 2019-07-12 DIAGNOSIS — I8501 Esophageal varices with bleeding: Secondary | ICD-10-CM | POA: Diagnosis not present

## 2019-07-12 DIAGNOSIS — I130 Hypertensive heart and chronic kidney disease with heart failure and stage 1 through stage 4 chronic kidney disease, or unspecified chronic kidney disease: Secondary | ICD-10-CM | POA: Diagnosis not present

## 2019-07-12 DIAGNOSIS — R1310 Dysphagia, unspecified: Secondary | ICD-10-CM | POA: Diagnosis not present

## 2019-07-12 DIAGNOSIS — N181 Chronic kidney disease, stage 1: Secondary | ICD-10-CM | POA: Diagnosis not present

## 2019-07-12 MED ORDER — EZETIMIBE 10 MG PO TABS: ORAL_TABLET | ORAL | 1 refills | Status: DC

## 2019-07-12 NOTE — Telephone Encounter (Signed)
Patient requested refill Faxed to pharmacy.

## 2019-07-15 ENCOUNTER — Ambulatory Visit: Payer: Self-pay | Admitting: Pharmacist

## 2019-07-15 ENCOUNTER — Other Ambulatory Visit: Payer: Self-pay | Admitting: *Deleted

## 2019-07-15 DIAGNOSIS — I81 Portal vein thrombosis: Secondary | ICD-10-CM | POA: Diagnosis not present

## 2019-07-15 DIAGNOSIS — K7290 Hepatic failure, unspecified without coma: Secondary | ICD-10-CM | POA: Diagnosis not present

## 2019-07-15 DIAGNOSIS — K7581 Nonalcoholic steatohepatitis (NASH): Secondary | ICD-10-CM | POA: Diagnosis not present

## 2019-07-15 DIAGNOSIS — K7469 Other cirrhosis of liver: Secondary | ICD-10-CM | POA: Diagnosis not present

## 2019-07-15 DIAGNOSIS — I8501 Esophageal varices with bleeding: Secondary | ICD-10-CM | POA: Diagnosis not present

## 2019-07-15 NOTE — Patient Outreach (Signed)
[place] [facility]) Care Management  07/15/2019  AXIE HAYNE 03-Mar-1946 [id number]   Call placed to member's son [name] to follow up on management of member's condition.  He state member is doing much better, increasing in strength, still working with home health PT.  He report member's memory is "not as bad" as it was previously and is improving.  State he feel she is about 90% back to her normal self, verbalizing understanding that she may not get 100% back to her old self and may still have episodes of confusion.  She is not scheduled to visit GI again until June but has follow up appointment with PCP office on 3/26.   He report member's blood pressure has been good as well as her blood sugars (reportedly ranging 120-180s).  Denies any urgent concerns, will follow up within the next month.  [facility] CM Care Plan Problem One     Most Recent Value  Care Plan Problem One  Risk for readmission related to bleeding varicies as evidenced by Recent hospitalizations  Role Documenting the Problem One  Care Management [place] for Problem One  Active  [facility]N Long Term Goal   Member will not be readmitted to hospital within the next 31 days  THN Long Term Goal Start Date  06/03/19 [Not met, goal reset]  THN Long Term Goal Met Date  07/15/19  [facility] CM Short Term Goal #1   Son will report completion of office visit with PCP within the next 4 weeks  [facility] CM Short Term Goal #1 Start Date  07/15/19  [facility] for Short Term Goal #1  Reviewed appointments with member's son, discussed importance of management of chronic conditions in effort to decrease hospitalizations  [facility] CM Short Term Goal #3  Family will report no episodes of recurrent confusion over the next 4 weeks  [facility] CM Short Term Goal #3 Start Date  06/19/19  [facility] CM Short Term Goal #3 Met Date  07/15/19     [name], RN, MSN [place] [phone]

## 2019-07-16 ENCOUNTER — Telehealth: Payer: Self-pay

## 2019-07-16 NOTE — Telephone Encounter (Signed)
Incoming packages received from [place] with patient assistance supplies/samples. The packages included  1.) Novolog Flexpen  5 x 86m, NCD [id number] Lot [id number] 08/06/2021  2.) Levemir FlexTouch  5 x 3 mL, NCD [id number] Lot [id number], Exp 06/08/2021  3.) Pen Needles  Patient aware samples/supplies are available for pick-up

## 2019-07-16 NOTE — Telephone Encounter (Signed)
Patient picked up both the insulin and the pen needles at 12:56 pm 07/16/19.

## 2019-07-17 ENCOUNTER — Telehealth: Payer: Self-pay | Admitting: Pharmacist

## 2019-07-17 ENCOUNTER — Other Ambulatory Visit: Payer: Self-pay | Admitting: Pharmacy Technician

## 2019-07-17 NOTE — Patient Outreach (Addendum)
[place] [facility]) Care Management  07/17/2019  YASUKO LAPAGE Aug 16, 1945 [id number]     Received  and read the following in EPIC regarding patient assistance medication delivery of Levemir and Novolog from [name]:  Elmore Guise, [place]    07/16/19 12:57 PM Note   Patient picked up both the insulin and the pen needles at 12:56 pm 07/16/19.    Logan Bores, [place]     07/16/19 10:28 AM Note   Incoming packages received from [place] with patient assistance supplies/samples. The packages included  1.) Novolog Flexpen  5 x 68m, NCD [id number] Lot [id number] 08/06/2021  2.) Levemir FlexTouch  5 x 3 mL, NCD [id number] Lot [id number], Exp 06/08/2021  3.) Pen Needles  Patient aware samples/supplies are available for pick-up      Previously discussed with son CGerald Stabsthe refill procedure with [employer] 07/08/2019. At that time he verbalized understanding and confirmed having name and number.  Follow up:   Will route note to TCampfor case closure as patient assistance has been completed. Will gladly reopen the case if patient decides to appeal the decision with BI and send in 2020 taxes for assistance with [place] as previously discussed with THayes  Leslieann Whisman P. Eulan Heyward, [place] ([phone]

## 2019-07-18 NOTE — Patient Outreach (Signed)
[place] [facility] Care Management  07/18/2019  [name] 01/06/1946 [id number]   Patient's case is being closed as she was approved for Xifaxan, Levemir and Novolog through patient assistance programming.   Elayne Guerin, PharmD, [place] Clinical Pharmacist [phone]

## 2019-07-22 DIAGNOSIS — K7290 Hepatic failure, unspecified without coma: Secondary | ICD-10-CM | POA: Diagnosis not present

## 2019-07-22 DIAGNOSIS — K7581 Nonalcoholic steatohepatitis (NASH): Secondary | ICD-10-CM | POA: Diagnosis not present

## 2019-07-22 DIAGNOSIS — I8501 Esophageal varices with bleeding: Secondary | ICD-10-CM | POA: Diagnosis not present

## 2019-07-22 DIAGNOSIS — I81 Portal vein thrombosis: Secondary | ICD-10-CM | POA: Diagnosis not present

## 2019-07-22 DIAGNOSIS — K7469 Other cirrhosis of liver: Secondary | ICD-10-CM | POA: Diagnosis not present

## 2019-07-23 ENCOUNTER — Other Ambulatory Visit: Payer: Self-pay | Admitting: Cardiovascular Disease

## 2019-07-26 ENCOUNTER — Ambulatory Visit: Payer: Self-pay | Admitting: Pharmacist

## 2019-07-29 ENCOUNTER — Telehealth: Payer: Self-pay | Admitting: *Deleted

## 2019-07-29 ENCOUNTER — Other Ambulatory Visit: Payer: Self-pay | Admitting: *Deleted

## 2019-07-29 DIAGNOSIS — I8501 Esophageal varices with bleeding: Secondary | ICD-10-CM | POA: Diagnosis not present

## 2019-07-29 DIAGNOSIS — K7290 Hepatic failure, unspecified without coma: Secondary | ICD-10-CM | POA: Diagnosis not present

## 2019-07-29 DIAGNOSIS — K7469 Other cirrhosis of liver: Secondary | ICD-10-CM | POA: Diagnosis not present

## 2019-07-29 DIAGNOSIS — K7581 Nonalcoholic steatohepatitis (NASH): Secondary | ICD-10-CM | POA: Diagnosis not present

## 2019-07-29 DIAGNOSIS — I81 Portal vein thrombosis: Secondary | ICD-10-CM | POA: Diagnosis not present

## 2019-07-29 MED ORDER — "INSULIN SYRINGE 31G X 5/16"" 1 ML MISC": 2 refills | Status: DC

## 2019-07-29 NOTE — Telephone Encounter (Signed)
Sounds like an appropriate intervention.  They may notify me if the increase is ineffective.

## 2019-07-29 NOTE — Telephone Encounter (Signed)
Bev with [facility] called and stated that patient had a 4lb weight gain overnight. Stated that patient increased her Lasix from 1/2 tablet to a whole tablet this morning. Stated that they will monitor.

## 2019-07-29 NOTE — Telephone Encounter (Signed)
Patient requested refill.  Faxed to pharmacy.

## 2019-08-01 ENCOUNTER — Telehealth: Payer: Self-pay | Admitting: *Deleted

## 2019-08-01 NOTE — Telephone Encounter (Signed)
[name] with [facility] called and stated that she just wanted to let Dr. Mariea Clonts know that while doing a Chart review patient missed 3 nursing appointment on 2/15,2/22, and 3/1 due to Scheduling errors.

## 2019-08-01 NOTE — Telephone Encounter (Signed)
Noted  

## 2019-08-02 ENCOUNTER — Encounter: Payer: Self-pay | Admitting: Family

## 2019-08-02 ENCOUNTER — Other Ambulatory Visit: Payer: Self-pay

## 2019-08-02 ENCOUNTER — Ambulatory Visit (INDEPENDENT_AMBULATORY_CARE_PROVIDER_SITE_OTHER): Payer: Medicare Other | Admitting: Family

## 2019-08-02 VITALS — BP 120/60 | HR 70 | Temp 98.1°F | Ht 63.0 in | Wt 143.0 lb

## 2019-08-02 DIAGNOSIS — E785 Hyperlipidemia, unspecified: Secondary | ICD-10-CM

## 2019-08-02 DIAGNOSIS — E119 Type 2 diabetes mellitus without complications: Secondary | ICD-10-CM | POA: Diagnosis not present

## 2019-08-02 DIAGNOSIS — Z794 Long term (current) use of insulin: Secondary | ICD-10-CM | POA: Diagnosis not present

## 2019-08-02 DIAGNOSIS — E1169 Type 2 diabetes mellitus with other specified complication: Secondary | ICD-10-CM

## 2019-08-02 DIAGNOSIS — K429 Umbilical hernia without obstruction or gangrene: Secondary | ICD-10-CM

## 2019-08-02 NOTE — Progress Notes (Signed)
Provider: Najla Aughenbaugh FNP-C  Gayland Curry, DO  Patient Care Team: Gayland Curry, DO as PCP - General (Geriatric Medicine) Berle Mull, MD as Consulting Physician (Family Medicine) Sharmon Revere as Physician Assistant (Cardiology) Calvert Cantor, MD as Consulting Physician (Ophthalmology) [name], MD as Attending Physician (Gastroenterology) [name], RN as [employer] Management  Extended Emergency Contact Information Primary Emergency Contact: Blase Mess Address: [address]          [place], [place] [address] Johnnette Litter of [place] Phone: [phone] Mobile Phone: [phone] Relation: Spouse Secondary Emergency Contact: Sandie Ano Address: [address]          [place], [place] [address] Johnnette Litter of [place] Phone: [phone] Mobile Phone: [phone] Relation: Son  Code Status:  Full Code  Goals of care: Advanced Directive information Advanced Directives 05/16/2019  Does Patient Have a Medical Advance Directive? Yes  Type of Advance Directive Living will  Does patient want to make changes to medical advance directive? No - Patient declined  Would patient like information on creating a medical advance directive? -     Chief Complaint  Patient presents with  . Acute Visit    Hernia    HPI:  Pt is a 74 y.o. female seen today for an acute visit for evaluation of abdominal hernia that has gotten worsen after coughing.she is here with her husband.she states hernia bulges out at the umbilicus area when ever she coughs or strain.she is able to push hernia back.No hardness noted.she denies any abdominal pain,constipation,nausea or vomiting.Has had hernia for a while but seems to have gotten worse.  She request fasting labs to be drawn prior to her next appointment 10/14/2019 with Dr.Reeed.    Past Medical History:  Diagnosis Date  . Allergy   . Arthritis    neck  . Cataract    bilateral - MD monitoring  cataracts  . CHF (congestive heart failure) ([place])   . Chronic kidney disease, stage I    DR OTTELIN  HX UTIS  . Cirrhosis ([place])   . Cramp of limb   . Diabetes mellitus   . Dysphagia, unspecified(787.20)   . Dysuria   . Epistaxis   . GERD (gastroesophageal reflux disease)   . Heart murmur    NO CARDIOLOGIST  DX FOR YEARS ASYMPTOMATIC  . Hepatic encephalopathy ([place])   . Lumbago   . Neoplasm of uncertain behavior of skin   . Nonspecific elevation of levels of transaminase or lactic acid dehydrogenase (LDH)   . Osteoarthrosis, unspecified whether generalized or localized, unspecified site   . Other and unspecified hyperlipidemia    diet controlled  . Pain in joint, shoulder region   . Paresthesias 04/01/2015  . Postablative ovarian failure   . Trochanteric bursitis of left hip 12/15/2015  . Type 2 diabetes mellitus without complication ([place])   . Unspecified essential hypertension    no meds   Past Surgical History:  Procedure Laterality Date  . BREAST BIOPSY    . CARDIAC CATHETERIZATION N/A 01/27/2016   Procedure: Left Heart Cath and Coronary Angiography;  Surgeon: Belva Crome, MD;  Location: [facility];  Service: Cardiovascular;  Laterality: N/A;  . COLONOSCOPY  2012   Dr Lajoyce Corners.   . COLONOSCOPY WITH PROPOFOL N/A 07/07/2016   Procedure: COLONOSCOPY WITH PROPOFOL;  Surgeon: [name], MD;  Location: [facility];  Service: Endoscopy;  Laterality: N/A;  . CORONARY ARTERY BYPASS GRAFT N/A 01/28/2016  Procedure: CORONARY ARTERY BYPASS GRAFTING (CABG) x 3 USING RIGHT LEG GREATER SAPHENOUS VEIN GRAFT;  Surgeon: Melrose Nakayama, MD;  Location: [facility];  Service: Open Heart Surgery;  Laterality: N/A;  . ENDOVEIN HARVEST OF GREATER SAPHENOUS VEIN Right 01/28/2016   Procedure: ENDOVEIN HARVEST OF GREATER SAPHENOUS VEIN;  Surgeon: Melrose Nakayama, MD;  Location: [facility];  Service: Open Heart Surgery;  Laterality: Right;  . ESOPHAGEAL BANDING  03/28/2019   Procedure:  ESOPHAGEAL BANDING;  Surgeon: [name], MD;  Location: [facility];  Service: Endoscopy;;  . ESOPHAGEAL BANDING  04/07/2019   Procedure: ESOPHAGEAL BANDING;  Surgeon: Juanita Craver, MD;  Location: [facility] ENDOSCOPY;  Service: Endoscopy;;  . ESOPHAGOGASTRODUODENOSCOPY N/A 04/07/2019   Procedure: ESOPHAGOGASTRODUODENOSCOPY (EGD);  Surgeon: Juanita Craver, MD;  Location: [facility] ENDOSCOPY;  Service: Endoscopy;  Laterality: N/A;  . ESOPHAGOGASTRODUODENOSCOPY (EGD) WITH PROPOFOL N/A 07/07/2016   Procedure: ESOPHAGOGASTRODUODENOSCOPY (EGD) WITH PROPOFOL;  Surgeon: [name], MD;  [facility];  Service: Endoscopy;  Laterality: N/A;  . ESOPHAGOGASTRODUODENOSCOPY (EGD) WITH PROPOFOL N/A 03/28/2019   Procedure: ESOPHAGOGASTRODUODENOSCOPY (EGD) WITH PROPOFOL;  Surgeon: [name], MD;  [facility];  Service: Endoscopy;  Laterality: N/A;  . HEMOSTASIS CLIP PLACEMENT  04/07/2019   Procedure: HEMOSTASIS CLIP PLACEMENT;  Surgeon: Juanita Craver, MD;  Location: [facility] ENDOSCOPY;  Service: Endoscopy;;  . IR ANGIOGRAM SELECTIVE EACH ADDITIONAL VESSEL  04/08/2019  . IR EMBO ART  VEN HEMORR LYMPH EXTRAV  INC GUIDE ROADMAPPING  04/08/2019  . IR PARACENTESIS  04/08/2019  . IR RADIOLOGIST EVAL & MGMT  06/13/2019  . IR TIPS  04/08/2019  . MAXIMUM ACCESS (MAS)POSTERIOR LUMBAR INTERBODY FUSION (PLIF) 1 LEVEL Left 06/10/2015   Procedure: FOR MAXIMUM ACCESS (MAS) POSTERIOR LUMBAR INTERBODY FUSION (PLIF) LUMBAR THREE-FOUR EXTRAFORAMINAL MICRODISCECTOMY LUMBAR FIVE-SACRAL ONE LEFT;  Surgeon: Eustace Moore, MD;  Location: [facility]URO ORS;  Service: Neurosurgery;  Laterality: Left;  . RADIOLOGY WITH ANESTHESIA N/A 04/08/2019   Procedure: RADIOLOGY WITH ANESTHESIA;  Surgeon: Radiologist, Medication, MD;  Location: [facility];  Service: Radiology;  Laterality: N/A;  . SCLEROTHERAPY  04/07/2019   Procedure: SCLEROTHERAPY;  Surgeon: Juanita Craver, MD;  Location: [facility] ENDOSCOPY;  Service: Endoscopy;;  . TEE WITHOUT  CARDIOVERSION N/A 01/28/2016   Procedure: TRANSESOPHAGEAL ECHOCARDIOGRAM (TEE);  Surgeon: Melrose Nakayama, MD;  Location: [facility];  Service: Open Heart Surgery;  Laterality: N/A;  . TUBAL LIGATION  1982   Dr Connye Burkitt  . UPPER GASTROINTESTINAL ENDOSCOPY    . VAGINAL HYSTERECTOMY  1997   Dr Rande Lawman    Allergies  Allergen Reactions  . Kiwi Extract Anaphylaxis  . Tdap [Tetanus-Diphth-Acell Pertussis] Swelling and Other (See Comments)    Swelling at injection site, gets very hot  . Statins Other (See Comments)    RHABDOMYOLYSIS  . Latex Itching, Dermatitis and Rash  . Tramadol Nausea And Vomiting    Outpatient Encounter Medications as of 08/02/2019  Medication Sig  . acetaminophen (TYLENOL) 500 MG tablet Take 500 mg by mouth at bedtime.   . BD PEN NEEDLE NANO U/F 32G X 4 MM MISC USE THREE TIMES DAILY AS DIRECTED  . Biotin 10000 MCG TABS Take 10,000 mcg by mouth every morning.  . Cyanocobalamin (VITAMIN B 12 PO) Take 1,000 mcg by mouth daily.    [name] Kitchen ezetimibe (ZETIA) 10 MG tablet Take one tablet by mouth once daily  . furosemide (LASIX) 20 MG tablet Take 1 tablet (20 mg total) by mouth daily. Hold if B/p < 110/60  .  glucose blood test strip One Touch Ultra II strips. Use to test blood sugar three times daily. Dx: E11.65  . insulin aspart (NOVOLOG FLEXPEN) 100 UNIT/ML FlexPen 12 units at breakfast, 8 units at lunch and 12 at dinner  . insulin detemir (LEVEMIR FLEXTOUCH) 100 UNIT/ML FlexPen Inject 30 Units into the skin daily.  . Insulin Syringe-Needle U-100 (INSULIN SYRINGE 1CC/31GX5/16") 31G X 5/16" 1 ML MISC USE AS DIRECTED DAILY WITH LEVEMIR. Dx: E11.65  . JARDIANCE 25 MG TABS tablet TAKE 1 TABLET BY MOUTH DAILY  . loratadine (CLARITIN) 10 MG tablet Take 10 mg by mouth daily as needed for allergies.  [name] Kitchen MAGNESIUM PO Take 500 mg by mouth daily.   . Multiple Vitamins-Minerals (MULTIVITAMIN WITH MINERALS) tablet Take 1 tablet by mouth daily.    . ondansetron (ZOFRAN) 4 MG tablet Take 1 tablet  (4 mg total) by mouth every 6 (six) hours as needed for nausea.  . pantoprazole (PROTONIX) 40 MG tablet Take 40 mg by mouth 2 (two) times daily.  Vladimir Faster Glycol-Propyl Glycol (SYSTANE OP) Place 1 drop into both eyes daily.   . Probiotic Product (PROBIOTIC DAILY PO) Take 1 capsule by mouth daily. Digestive Advantage Probiotic  . rifaximin (XIFAXAN) 550 MG TABS tablet Take 1 tablet (550 mg total) by mouth 2 (two) times daily.  . [DISCONTINUED] LEVEMIR 100 UNIT/ML injection INJECT 48 UNITS INTO THE SKIN EVERY NIGHT AT BEDTIME  . [DISCONTINUED] NOVOLOG FLEXPEN 100 UNIT/ML FlexPen Inject 5 units with breakfast, lunch, and dinner.   No facility-administered encounter medications on file as of 08/02/2019.    Review of Systems  Constitutional: Negative for appetite change, chills and fever.  Respiratory: Negative for cough, chest tightness, shortness of breath and wheezing.   Cardiovascular: Positive for leg swelling. Negative for chest pain and palpitations.  Gastrointestinal: Negative for abdominal distention, abdominal pain, constipation, diarrhea, nausea and vomiting.       Worsening hernia per HPI  Skin: Negative for color change, pallor and rash.  Neurological: Negative for dizziness, speech difficulty, light-headedness, numbness and headaches.  Psychiatric/Behavioral: Negative for agitation and sleep disturbance. The patient is not nervous/anxious.     Immunization History  Administered Date(s) Administered  . Fluad Quad(high Dose 65+) 01/31/2019  . Hep A / Hep B 06/24/2016, 07/04/2016, 07/21/2016  . Influenza Whole 02/28/2012  . Influenza, High Dose Seasonal PF 01/26/2017, 01/29/2018  . Influenza,inj,Quad PF,6+ Mos 03/27/2013, 01/16/2014, 03/31/2015, 02/26/2016  . Pneumococcal Conjugate-13 06/28/2011  . Pneumococcal Polysaccharide-23 04/19/2016  . Td 01/12/1999   Pertinent  Health Maintenance Due  Topic Date Due  . OPHTHALMOLOGY EXAM  08/24/2018  . HEMOGLOBIN A1C  09/29/2019   . FOOT EXAM  10/04/2019  . URINE MICROALBUMIN  01/31/2020  . MAMMOGRAM  09/25/2020  . COLONOSCOPY  07/07/2021  . INFLUENZA VACCINE  Completed  . DEXA SCAN  Completed  . PNA vac Low Risk Adult  Completed   Fall Risk  08/02/2019 06/12/2019 05/13/2019 04/19/2019 04/15/2019  Falls in the past year? 0 0 0 0 0  Number falls in past yr: 0 0 0 0 -  Injury with Fall? 0 0 0 0 -    Vitals:   08/02/19 0826  BP: 120/60  Pulse: 70  Temp: 98.1 F (36.7 C)  TempSrc: Tympanic  SpO2: 98%  Weight: 143 lb (64.9 kg)  Height: _0  (1.6 m)   Body mass index is 25.33 kg/m. Physical Exam Vitals reviewed.  Constitutional:      General: She is not in  acute distress.    Appearance: She is overweight. She is not ill-appearing.  HENT:     Head: Normocephalic.     Ears:     Comments: Hearing aids in place  Eyes:     General: No scleral icterus.       Right eye: No discharge.        Left eye: No discharge.     Extraocular Movements: Extraocular movements intact.     Conjunctiva/sclera: Conjunctivae normal.     Pupils: Pupils are equal, round, and reactive to light.  Cardiovascular:     Rate and Rhythm: Normal rate and regular rhythm.     Pulses: Normal pulses.     Heart sounds: Normal heart sounds. No murmur. No friction rub. No gallop.   Pulmonary:     Effort: Pulmonary effort is normal. No respiratory distress.     Breath sounds: Normal breath sounds. No wheezing, rhonchi or rales.  Chest:     Chest wall: No tenderness.  Abdominal:     General: There is no distension.     Palpations: Abdomen is soft.     Tenderness: There is abdominal tenderness. There is no right CVA tenderness, left CVA tenderness, guarding or rebound.     Hernia: A hernia is present. Hernia is present in the umbilical area.  Musculoskeletal:        General: No swelling or tenderness. Normal range of motion.     Comments: Bilateral lower extremities 1+ edema.  Skin:    General: Skin is warm and dry.     Coloration: Skin  is not pale.     Findings: No bruising, erythema or rash.  Neurological:     Mental Status: She is alert and oriented to person, place, and time.     Cranial Nerves: No cranial nerve deficit.     Sensory: No sensory deficit.     Motor: No weakness.     Coordination: Coordination normal.     Gait: Gait normal.  Psychiatric:        Mood and Affect: Mood normal.        Behavior: Behavior normal.        Thought Content: Thought content normal.        Judgment: Judgment normal.    Labs reviewed: Recent Labs    05/30/19 0424 05/30/19 0424 05/31/19 0500 06/01/19 0239 06/11/19 0000  NA 139   < > 137 140 137  K 4.0   < > 3.6 3.6 4.3  CL 111   < > 110 116* 98*  CO2 20*   < > 19* 18* 22  GLUCOSE 192*  --  239* 239*  --   BUN 13   < > 15 12 24*  CREATININE 0.88   < > 0.93 0.82 0.9  0.9  CALCIUM 9.6   < > 9.1 9.1 10.0  MG 2.0  --  1.9 1.6*  --   PHOS 2.0*  --  3.3 3.5  --    < > = values in this interval not displayed.   Recent Labs    05/16/19 0719 05/16/19 0719 05/26/19 1332 05/26/19 1332 05/27/19 0352 05/28/19 0257 05/31/19 0500 06/01/19 0239 06/11/19 0000  AST 58*   < > 60*  --  53*  --   --   --  80*  ALT 43   < > 45*  --  36  --   --   --  42*  ALKPHOS 104   < >  190*  --  148*  --   --   --  259*  BILITOT 4.2*  --  3.1*  --  2.2*  --   --   --   --   PROT 6.6  --  7.4  --  5.8*  --   --   --   --   ALBUMIN 2.6*   < > 2.8*   < > 2.2*   < > 2.0* 1.9* 2.7*   < > = values in this interval not displayed.   Recent Labs    05/30/19 0424 05/30/19 0424 05/31/19 0500 06/01/19 0239 06/11/19 0000  WBC 3.9*   < > 3.5* 3.9* 5.0  NEUTROABS 2.1   < > 1.8 2.2 3  HGB 12.9   < > 12.4 12.2 13.2  HCT 38.8   < > 36.3 35.4* 38  MCV 99.2  --  98.9 97.3  --   PLT 76*   < > 77* 82* 125*   < > = values in this interval not displayed.   Lab Results  Component Value Date   TSH 2.150 06/23/2014   Lab Results  Component Value Date   HGBA1C 8.4 (H) 04/01/2019   Lab Results   Component Value Date   CHOL 147 05/24/2018   HDL 53 05/24/2018   LDLCALC 80 05/24/2018   TRIG 64 05/24/2018   CHOLHDL 2.8 05/24/2018    Significant Diagnostic Results in last 30 days:  No results found.  Assessment/Plan 1. Umbilical hernia without obstruction and without gangrene Worsening umbilical hernia with cough or strain.Hernia reducible without any obstruction noted.Discussed option with patient and husband  include watching for signs of obstruction verse referral to specialist.she would like consult with general surgery.  - Ambulatory referral to General Surgery specialist will call for appointment. - Advised to notify provider or go to ED for worsening symptoms if unable to reduce hernia.   2. Type 2 diabetes mellitus without complication, with long-term current use of insulin ([place]) Request lab work order prior to her future appointment with Dr.Reed - continue on current medication  - CBC with Differential/Platelet; Future - CMP with eGFR([facility]); Future - TSH; Future - Hemoglobin A1c; Future  3. Hyperlipidemia associated with type 2 diabetes mellitus ([place]) Request lab work order prior to her future appointment with Dr.Reed - continue on Zetia  - Lipid panel; Future  Family/ staff Communication: Reviewed plan of care with patient  Labs/tests ordered:  - CBC with Differential/Platelet; Future - CMP with eGFR([facility]); Future - TSH; Future - Hemoglobin A1c; Future  - Lipid panel; Future  Next Appointment: Has appointment with Dr.Reed 10/14/2019.Labs 2-4 days prior to visit.  Sandrea Hughs, NP

## 2019-08-02 NOTE — Patient Instructions (Signed)
1. Referral placed to General surgery to evaluate your hernia  2. Please Notify provider or go to ED  if symptoms worse or unable to push back hernia.   Hernia, Adult     A hernia happens when tissue inside your body pushes out through a weak spot in your belly muscles (abdominal wall). This makes a round lump (bulge). The lump may be:  In a scar from surgery that was done in your belly (incisional hernia).  Near your belly button (umbilical hernia).  In your groin (inguinal hernia). Your groin is the area where your leg meets your lower belly (abdomen). This kind of hernia could also be: ? In your scrotum, if you are female. ? In folds of skin around your vagina, if you are female.  In your upper thigh (femoral hernia).  Inside your belly (hiatal hernia). This happens when your stomach slides above the muscle between your belly and your chest (diaphragm). If your hernia is small and it does not cause pain, you may not need treatment. If your hernia is large or it causes pain, you may need surgery. Follow these instructions at home: Activity  Avoid stretching or overusing (straining) the muscles near your hernia. Straining can happen when you: ? Lift something heavy. ? Poop (have a bowel movement).  Do not lift anything that is heavier than 10 lb (4.5 kg), or the limit that you are told, until your doctor says that it is safe.  Use the strength of your legs when you lift something heavy. Do not use only your back muscles to lift. General instructions  Do these things if told by your doctor so you do not have trouble pooping (constipation): ? Drink enough fluid to keep your pee (urine) pale yellow. ? Eat foods that are high in fiber. These include fresh fruits and vegetables, whole grains, and beans. ? Limit foods that are high in fat and processed sugars. These include foods that are fried or sweet. ? Take medicine for trouble pooping.  When you cough, try to cough gently.  You  may try to push your hernia in by very gently pressing on it when you are lying down. Do not try to force the bulge back in if it will not push in easily.  If you are overweight, work with your doctor to lose weight safely.  Do not use any products that have nicotine or tobacco in them. These include cigarettes and e-cigarettes. If you need help quitting, ask your doctor.  If you will be having surgery (hernia repair), watch your hernia for changes in shape, size, or color. Tell your doctor if you see any changes.  Take over-the-counter and prescription medicines only as told by your doctor.  Keep all follow-up visits as told by your doctor. Contact a doctor if:  You get new pain, swelling, or redness near your hernia.  You poop fewer times in a week than normal.  You have trouble pooping.  You have poop (stool) that is more dry than normal.  You have poop that is harder or larger than normal. Get help right away if:  You have a fever.  You have belly pain that gets worse.  You feel sick to your stomach (nauseous).  You throw up (vomit).  Your hernia cannot be pushed in by very gently pressing on it when you are lying down. Do not try to force the bulge back in if it will not push in easily.  Your hernia: ?  Changes in shape or size. ? Changes color. ? Feels hard or it hurts when you touch it. These symptoms may represent a serious problem that is an emergency. Do not wait to see if the symptoms will go away. Get medical help right away. Call your local emergency services (911 in the [place] Summary  A hernia happens when tissue inside your body pushes out through a weak spot in the belly muscles. This creates a bulge.  If your hernia is small and it does not hurt, you may not need treatment. If your hernia is large or it hurts, you may need surgery.  If you will be having surgery, watch your hernia for changes in shape, size, or color. Tell your doctor about any  changes. This information is not intended to replace advice given to you by your health care provider. Make sure you discuss any questions you have with your health care provider. Document Revised: 08/16/2018 Document Reviewed: 01/25/2017 Elsevier Patient Education  [employer].

## 2019-08-05 ENCOUNTER — Other Ambulatory Visit: Payer: Self-pay | Admitting: Interventional Radiology

## 2019-08-05 DIAGNOSIS — I81 Portal vein thrombosis: Secondary | ICD-10-CM | POA: Diagnosis not present

## 2019-08-05 DIAGNOSIS — K7581 Nonalcoholic steatohepatitis (NASH): Secondary | ICD-10-CM | POA: Diagnosis not present

## 2019-08-05 DIAGNOSIS — I8501 Esophageal varices with bleeding: Secondary | ICD-10-CM | POA: Diagnosis not present

## 2019-08-05 DIAGNOSIS — Z95828 Presence of other vascular implants and grafts: Secondary | ICD-10-CM

## 2019-08-05 DIAGNOSIS — K7469 Other cirrhosis of liver: Secondary | ICD-10-CM | POA: Diagnosis not present

## 2019-08-05 DIAGNOSIS — K7290 Hepatic failure, unspecified without coma: Secondary | ICD-10-CM | POA: Diagnosis not present

## 2019-08-12 ENCOUNTER — Other Ambulatory Visit: Payer: Self-pay | Admitting: *Deleted

## 2019-08-12 NOTE — Patient Outreach (Signed)
[facility]) Care Management  08/12/2019  [name] [birth year] [id number]   Call placed to son to follow up on member's current condition.  He report that member is doing much better. State that she is almost completely back to her normal self, cooking for the family, caring for herself and doing housework.  Denies that member has had any ongoing confusion, still taking her Xifaxan.  She had follow up with PCP office on 3/26, concern at that time was for hernia.  Referral was placed for general surgery however member has decided not to proceed with referral as pain is starting to decrease.  She will contact the office should the pain continue for an assessment.    Report home health team are still active, denies any urgent concerns at this time.  Advised to contact this care manager with questions, will follow up within the next month.  [facility] CM Care Plan Problem One     Most Recent Value  Care Plan Problem One  Risk for readmission related to bleeding varicies as evidenced by Recent hospitalizations  Role Documenting the Problem One  Care Management Coordinator  Care Plan for Problem One  Active  [facility] CM Short Term Goal #1   Son will report completion of office visit with PCP within the next 4 weeks  [facility] CM Short Term Goal #1 Start Date  07/15/19  [facility] CM Short Term Goal #1 Met Date  08/12/19  [facility] CM Short Term Goal #2   Member will report decrease in abdominal pain (related to hernia) within the next 4 weeks  [facility] CM Short Term Goal #2 Start Date  08/12/19  Interventions for Short Term Goal #2  Son educated on plan if pain continues.  Confirmed he has contact information for surgeon     [name], RN, MSN [place] Manager [phone]

## 2019-08-19 ENCOUNTER — Inpatient Hospital Stay (HOSPITAL_COMMUNITY)
Admission: EM | Admit: 2019-08-19 | Discharge: 2019-08-21 | DRG: 308 | Disposition: A | Discharge status: Home / Self Care | Payer: Medicare Other | Attending: Cardiology | Admitting: Cardiology

## 2019-08-19 ENCOUNTER — Other Ambulatory Visit: Payer: Self-pay

## 2019-08-19 ENCOUNTER — Emergency Department (HOSPITAL_COMMUNITY): Payer: Medicare Other

## 2019-08-19 ENCOUNTER — Encounter (HOSPITAL_COMMUNITY): Payer: Self-pay | Admitting: Emergency Medicine

## 2019-08-19 ENCOUNTER — Encounter (HOSPITAL_COMMUNITY): Payer: Self-pay | Admitting: *Deleted

## 2019-08-19 ENCOUNTER — Ambulatory Visit (INDEPENDENT_AMBULATORY_CARE_PROVIDER_SITE_OTHER)
Admission: EM | Admit: 2019-08-19 | Discharge: 2019-08-19 | Disposition: A | Discharge status: Home / Self Care | Payer: Medicare Other | Source: Home / Self Care | Attending: Family Medicine | Admitting: Family Medicine

## 2019-08-19 DIAGNOSIS — M1990 Unspecified osteoarthritis, unspecified site: Secondary | ICD-10-CM | POA: Diagnosis present

## 2019-08-19 DIAGNOSIS — R0781 Pleurodynia: Secondary | ICD-10-CM | POA: Diagnosis present

## 2019-08-19 DIAGNOSIS — Z79899 Other long term (current) drug therapy: Secondary | ICD-10-CM

## 2019-08-19 DIAGNOSIS — Z981 Arthrodesis status: Secondary | ICD-10-CM

## 2019-08-19 DIAGNOSIS — I2510 Atherosclerotic heart disease of native coronary artery without angina pectoris: Secondary | ICD-10-CM | POA: Diagnosis present

## 2019-08-19 DIAGNOSIS — Z8249 Family history of ischemic heart disease and other diseases of the circulatory system: Secondary | ICD-10-CM

## 2019-08-19 DIAGNOSIS — Z20822 Contact with and (suspected) exposure to covid-19: Secondary | ICD-10-CM | POA: Diagnosis not present

## 2019-08-19 DIAGNOSIS — Z85828 Personal history of other malignant neoplasm of skin: Secondary | ICD-10-CM | POA: Diagnosis not present

## 2019-08-19 DIAGNOSIS — I4891 Unspecified atrial fibrillation: Secondary | ICD-10-CM

## 2019-08-19 DIAGNOSIS — I25119 Atherosclerotic heart disease of native coronary artery with unspecified angina pectoris: Secondary | ICD-10-CM | POA: Diagnosis not present

## 2019-08-19 DIAGNOSIS — Z8261 Family history of arthritis: Secondary | ICD-10-CM

## 2019-08-19 DIAGNOSIS — E1165 Type 2 diabetes mellitus with hyperglycemia: Secondary | ICD-10-CM | POA: Diagnosis present

## 2019-08-19 DIAGNOSIS — Z888 Allergy status to other drugs, medicaments and biological substances status: Secondary | ICD-10-CM

## 2019-08-19 DIAGNOSIS — I25118 Atherosclerotic heart disease of native coronary artery with other forms of angina pectoris: Secondary | ICD-10-CM | POA: Diagnosis present

## 2019-08-19 DIAGNOSIS — E785 Hyperlipidemia, unspecified: Secondary | ICD-10-CM | POA: Diagnosis present

## 2019-08-19 DIAGNOSIS — Z91040 Latex allergy status: Secondary | ICD-10-CM

## 2019-08-19 DIAGNOSIS — Z86010 Personal history of colonic polyps: Secondary | ICD-10-CM | POA: Diagnosis not present

## 2019-08-19 DIAGNOSIS — E119 Type 2 diabetes mellitus without complications: Secondary | ICD-10-CM | POA: Diagnosis present

## 2019-08-19 DIAGNOSIS — I340 Nonrheumatic mitral (valve) insufficiency: Secondary | ICD-10-CM | POA: Diagnosis not present

## 2019-08-19 DIAGNOSIS — I252 Old myocardial infarction: Secondary | ICD-10-CM

## 2019-08-19 DIAGNOSIS — I5033 Acute on chronic diastolic (congestive) heart failure: Secondary | ICD-10-CM | POA: Diagnosis present

## 2019-08-19 DIAGNOSIS — I8510 Secondary esophageal varices without bleeding: Secondary | ICD-10-CM | POA: Diagnosis present

## 2019-08-19 DIAGNOSIS — R0602 Shortness of breath: Secondary | ICD-10-CM | POA: Diagnosis not present

## 2019-08-19 DIAGNOSIS — E876 Hypokalemia: Secondary | ICD-10-CM | POA: Diagnosis present

## 2019-08-19 DIAGNOSIS — R079 Chest pain, unspecified: Secondary | ICD-10-CM

## 2019-08-19 DIAGNOSIS — N181 Chronic kidney disease, stage 1: Secondary | ICD-10-CM | POA: Diagnosis present

## 2019-08-19 DIAGNOSIS — Z951 Presence of aortocoronary bypass graft: Secondary | ICD-10-CM | POA: Diagnosis not present

## 2019-08-19 DIAGNOSIS — K7460 Unspecified cirrhosis of liver: Secondary | ICD-10-CM | POA: Diagnosis present

## 2019-08-19 DIAGNOSIS — M542 Cervicalgia: Secondary | ICD-10-CM | POA: Diagnosis present

## 2019-08-19 DIAGNOSIS — I130 Hypertensive heart and chronic kidney disease with heart failure and stage 1 through stage 4 chronic kidney disease, or unspecified chronic kidney disease: Secondary | ICD-10-CM | POA: Diagnosis present

## 2019-08-19 DIAGNOSIS — I300 Acute nonspecific idiopathic pericarditis: Secondary | ICD-10-CM | POA: Diagnosis not present

## 2019-08-19 DIAGNOSIS — I8500 Esophageal varices without bleeding: Secondary | ICD-10-CM | POA: Diagnosis present

## 2019-08-19 DIAGNOSIS — Z794 Long term (current) use of insulin: Secondary | ICD-10-CM | POA: Diagnosis not present

## 2019-08-19 DIAGNOSIS — I361 Nonrheumatic tricuspid (valve) insufficiency: Secondary | ICD-10-CM | POA: Diagnosis not present

## 2019-08-19 DIAGNOSIS — K922 Gastrointestinal hemorrhage, unspecified: Secondary | ICD-10-CM | POA: Diagnosis present

## 2019-08-19 DIAGNOSIS — K219 Gastro-esophageal reflux disease without esophagitis: Secondary | ICD-10-CM | POA: Diagnosis present

## 2019-08-19 DIAGNOSIS — K7469 Other cirrhosis of liver: Secondary | ICD-10-CM | POA: Diagnosis not present

## 2019-08-19 DIAGNOSIS — Z91018 Allergy to other foods: Secondary | ICD-10-CM

## 2019-08-19 DIAGNOSIS — M810 Age-related osteoporosis without current pathological fracture: Secondary | ICD-10-CM | POA: Diagnosis present

## 2019-08-19 DIAGNOSIS — I248 Other forms of acute ischemic heart disease: Secondary | ICD-10-CM | POA: Diagnosis present

## 2019-08-19 DIAGNOSIS — R0789 Other chest pain: Secondary | ICD-10-CM | POA: Diagnosis not present

## 2019-08-19 DIAGNOSIS — Z887 Allergy status to serum and vaccine status: Secondary | ICD-10-CM

## 2019-08-19 DIAGNOSIS — IMO0002 Reserved for concepts with insufficient information to code with codable children: Secondary | ICD-10-CM | POA: Diagnosis present

## 2019-08-19 LAB — PROTIME-INR
INR: 1.1 (ref 0.8–1.2)
Prothrombin Time: 14.4 seconds (ref 11.4–15.2)

## 2019-08-19 LAB — HEPATIC FUNCTION PANEL
ALT: 41 U/L (ref 0–44)
AST: 63 U/L — ABNORMAL HIGH (ref 15–41)
Albumin: 3 g/dL — ABNORMAL LOW (ref 3.5–5.0)
Alkaline Phosphatase: 144 U/L — ABNORMAL HIGH (ref 38–126)
Bilirubin, Direct: 0.8 mg/dL — ABNORMAL HIGH (ref 0.0–0.2)
Indirect Bilirubin: 1.4 mg/dL — ABNORMAL HIGH (ref 0.3–0.9)
Total Bilirubin: 2.2 mg/dL — ABNORMAL HIGH (ref 0.3–1.2)
Total Protein: 7.4 g/dL (ref 6.5–8.1)

## 2019-08-19 LAB — BASIC METABOLIC PANEL
Anion gap: 11 (ref 5–15)
BUN: 19 mg/dL (ref 8–23)
CO2: 25 mmol/L (ref 22–32)
Calcium: 9.5 mg/dL (ref 8.9–10.3)
Chloride: 104 mmol/L (ref 98–111)
Creatinine, Ser: 0.96 mg/dL (ref 0.44–1.00)
GFR calc Af Amer: >60 mL/min (ref >60)
GFR calc non Af Amer: 59 mL/min — ABNORMAL LOW (ref >60)
Glucose, Bld: 164 mg/dL — ABNORMAL HIGH (ref 70–99)
Potassium: 3.3 mmol/L — ABNORMAL LOW (ref 3.5–5.1)
Sodium: 140 mmol/L (ref 135–145)

## 2019-08-19 LAB — T4, FREE: Free T4: 1.47 ng/dL — ABNORMAL HIGH (ref 0.61–1.12)

## 2019-08-19 LAB — CBC
HCT: 40.7 % (ref 36.0–46.0)
Hemoglobin: 13 g/dL (ref 12.0–15.0)
MCH: 30.5 pg (ref 26.0–34.0)
MCHC: 31.9 g/dL (ref 30.0–36.0)
MCV: 95.5 fL (ref 80.0–100.0)
Platelets: 102 10*3/uL — ABNORMAL LOW (ref 150–400)
RBC: 4.26 MIL/uL (ref 3.87–5.11)
RDW: 14.7 % (ref 11.5–15.5)
WBC: 6.4 10*3/uL (ref 4.0–10.5)
nRBC: 0 % (ref 0.0–0.2)

## 2019-08-19 LAB — BRAIN NATRIURETIC PEPTIDE: B Natriuretic Peptide: 264.9 pg/mL — ABNORMAL HIGH (ref 0.0–100.0)

## 2019-08-19 LAB — TSH: TSH: 3.704 u[IU]/mL (ref 0.350–4.500)

## 2019-08-19 LAB — TROPONIN I (HIGH SENSITIVITY)
Troponin I (High Sensitivity): 24 ng/L — ABNORMAL HIGH (ref <18)
Troponin I (High Sensitivity): 27 ng/L — ABNORMAL HIGH (ref <18)

## 2019-08-19 LAB — MAGNESIUM: Magnesium: 2.1 mg/dL (ref 1.7–2.4)

## 2019-08-19 LAB — GROUP A STREP BY PCR: Group A Strep by PCR: NOT DETECTED

## 2019-08-19 LAB — POC SARS CORONAVIRUS 2 AG -  ED: SARS Coronavirus 2 Ag: NEGATIVE

## 2019-08-19 LAB — SARS CORONAVIRUS 2 (TAT 6-24 HRS): SARS Coronavirus 2: NEGATIVE

## 2019-08-19 MED ORDER — SODIUM CHLORIDE 0.9% FLUSH
3.0000 mL | Freq: Once | INTRAVENOUS | Status: AC
  Administered 2019-08-19: 3 mL via INTRAVENOUS

## 2019-08-19 MED ORDER — METOPROLOL TARTRATE 12.5 MG HALF TABLET
12.5000 mg | ORAL_TABLET | Freq: Two times a day (BID) | ORAL | Status: DC
  Administered 2019-08-19 – 2019-08-20 (×3): 12.5 mg via ORAL
  Filled 2019-08-19 (×4): qty 1

## 2019-08-19 MED ORDER — ONDANSETRON HCL 4 MG/2ML IJ SOLN: 4.0000 mg | Freq: Four times a day (QID) | INTRAMUSCULAR | Status: DC | PRN

## 2019-08-19 MED ORDER — POTASSIUM CHLORIDE CRYS ER 20 MEQ PO TBCR
80.0000 meq | EXTENDED_RELEASE_TABLET | Freq: Once | ORAL | Status: AC
  Administered 2019-08-19: 80 meq via ORAL
  Filled 2019-08-19: qty 4

## 2019-08-19 MED ORDER — ALPRAZOLAM 0.25 MG PO TABS: 0.2500 mg | ORAL_TABLET | Freq: Two times a day (BID) | ORAL | Status: DC | PRN

## 2019-08-19 MED ORDER — HEPARIN (PORCINE) 25000 UT/250ML-% IV SOLN
1100.0000 [IU]/h | INTRAVENOUS | Status: DC
  Administered 2019-08-19: 1000 [IU]/h via INTRAVENOUS
  Filled 2019-08-19: qty 250

## 2019-08-19 MED ORDER — FUROSEMIDE 10 MG/ML IJ SOLN
20.0000 mg | Freq: Two times a day (BID) | INTRAMUSCULAR | Status: DC
  Administered 2019-08-19 – 2019-08-20 (×3): 20 mg via INTRAVENOUS
  Filled 2019-08-19 (×4): qty 2

## 2019-08-19 MED ORDER — FUROSEMIDE 10 MG/ML IJ SOLN: 40.0000 mg | Freq: Two times a day (BID) | INTRAMUSCULAR | Status: DC

## 2019-08-19 MED ORDER — ACETAMINOPHEN 325 MG PO TABS: 650.0000 mg | ORAL_TABLET | ORAL | Status: DC | PRN

## 2019-08-19 MED ORDER — ZOLPIDEM TARTRATE 5 MG PO TABS: 5.0000 mg | ORAL_TABLET | Freq: Every evening | ORAL | Status: DC | PRN

## 2019-08-19 MED ORDER — FUROSEMIDE 10 MG/ML IJ SOLN
20.0000 mg | Freq: Once | INTRAMUSCULAR | Status: AC
  Administered 2019-08-19: 20 mg via INTRAVENOUS
  Filled 2019-08-19: qty 2

## 2019-08-19 MED ORDER — HEPARIN BOLUS VIA INFUSION
3250.0000 [IU] | Freq: Once | INTRAVENOUS | Status: AC
  Administered 2019-08-19: 3250 [IU] via INTRAVENOUS
  Filled 2019-08-19: qty 3250

## 2019-08-19 NOTE — Progress Notes (Signed)
ANTICOAGULATION CONSULT NOTE - Initial Consult  Pharmacy Consult for heparin Indication: atrial fibrillation  Allergies  Allergen Reactions  . Kiwi Extract Anaphylaxis  . Tdap [Tetanus-Diphth-Acell Pertussis] Swelling and Other (See Comments)    Swelling at injection site, gets very hot  . Statins Other (See Comments)    RHABDOMYOLYSIS  . Latex Itching, Dermatitis and Rash  . Tramadol Nausea And Vomiting    Patient Measurements:   Heparin Dosing Weight: 65 kg  Vital Signs: Temp: 98.2 F (36.8 C) (04/12 1034) Temp Source: Oral (04/12 1034) BP: 107/60 (04/12 1034) Pulse Rate: 45 (04/12 1034)  Labs: Recent Labs    08/19/19 1127  HGB 13.0  HCT 40.7  PLT 102*  CREATININE 0.96  TROPONINIHS 24*    CrCl cannot be calculated (Unknown ideal weight.).   Medical History: Past Medical History:  Diagnosis Date  . Allergy   . Arthritis    neck  . Cataract    bilateral - MD monitoring cataracts  . CHF (congestive heart failure) ([place])   . Chronic kidney disease, stage I    DR OTTELIN  HX UTIS  . Cirrhosis ([place])   . Cramp of limb   . Diabetes mellitus   . Dysphagia, unspecified(787.20)   . Dysuria   . Epistaxis   . GERD (gastroesophageal reflux disease)   . Heart murmur    NO CARDIOLOGIST  DX FOR YEARS ASYMPTOMATIC  . Hepatic encephalopathy ([place])   . Lumbago   . Neoplasm of uncertain behavior of skin   . Nonspecific elevation of levels of transaminase or lactic acid dehydrogenase (LDH)   . Osteoarthrosis, unspecified whether generalized or localized, unspecified site   . Other and unspecified hyperlipidemia    diet controlled  . Pain in joint, shoulder region   . Paresthesias 04/01/2015  . Postablative ovarian failure   . Trochanteric bursitis of left hip 12/15/2015  . Type 2 diabetes mellitus without complication ([place])   . Unspecified essential hypertension    no meds    Assessment: 74 yo F presents with new onset atrial fibrillation. CHADSVASc score 7. No  AC noted PTA. Pharmacy asked to dose heparin. Hgb wnl, plts 102. No overt bleeding noted.  Goal of Therapy:  Heparin level 0.3-0.7 units/ml Monitor platelets by anticoagulation protocol: Yes   Plan:  Give Heparin 3250 unit IV bolus x1 Start heparin infusion at 1000 units/hr Check 8-hr HL Monitor daily HL, CBC, s/sx bleeding F/u transition to oral anticoagulant  Richardine Service, PharmD PGY1 Pharmacy Resident Phone: [phone] 08/19/2019  3:40 PM  Please check [email or web address] for unit-specific pharmacy phone numbers.

## 2019-08-19 NOTE — ED Provider Notes (Signed)
[facility]    CSN: [id number] Arrival date & time: 08/19/19  2536      History   Chief Complaint No chief complaint on file. Chest Pain  HPI KAILEEN BRONKEMA is a 74 y.o. female history of CAD, CHF, hypertension, DM type II, presenting today for evaluation of chest pain.  Patient notes that on Saturday she began to develop a pressure sensation in her chest.  This has been constant since.  Today she is also associated right-sided neck discomfort.  She has associated shortness of breath, feels as if she cannot take a full deep breath.  Denies headaches or vision changes.  Denies nausea or vomiting.  HPI  Past Medical History:  Diagnosis Date  . Allergy   . Arthritis    neck  . Cataract    bilateral - MD monitoring cataracts  . CHF (congestive heart failure) ([place])   . Chronic kidney disease, stage I    DR OTTELIN  HX UTIS  . Cirrhosis ([place])   . Cramp of limb   . Diabetes mellitus   . Dysphagia, unspecified(787.20)   . Dysuria   . Epistaxis   . GERD (gastroesophageal reflux disease)   . Heart murmur    NO CARDIOLOGIST  DX FOR YEARS ASYMPTOMATIC  . Hepatic encephalopathy ([place])   . Lumbago   . Neoplasm of uncertain behavior of skin   . Nonspecific elevation of levels of transaminase or lactic acid dehydrogenase (LDH)   . Osteoarthrosis, unspecified whether generalized or localized, unspecified site   . Other and unspecified hyperlipidemia    diet controlled  . Pain in joint, shoulder region   . Paresthesias 04/01/2015  . Postablative ovarian failure   . Trochanteric bursitis of left hip 12/15/2015  . Type 2 diabetes mellitus without complication ([place])   . Unspecified essential hypertension    no meds    Patient Active Problem List   Diagnosis Date Noted  . S/P TIPS (transjugular intrahepatic portosystemic shunt)   . Hypercalcemia 05/26/2019  . AKI (acute kidney injury) ([place])   . Hyponatremia   . Acute lower UTI 05/16/2019  . Acute delirium   .  General weakness 05/15/2019  . Encephalopathy, hepatic ([place]) 05/15/2019  . Volume depletion 05/15/2019  . Bleeding esophageal varices ([place])   . UGI bleed   . Infarction of spleen 04/06/2019  . Odynophagia 04/01/2019  . Dysphagia   . Hepatoma ([place]) 01/31/2019  . Coronary artery disease of native artery of native heart with stable angina pectoris ([place]) 01/31/2019  . Thrombocytopenia ([place]) 09/25/2017  . Left forearm pain 05/29/2017  . Portal vein thrombosis 04/22/2017  . Skin cancer of nose 08/23/2016  . History of colonic polyps   . Benign neoplasm of rectum   . Esophageal varices without bleeding ([place])   . Portal hypertensive gastropathy ([place])   . Leg cramps 05/18/2016  . Routine lab draw 05/13/2016  . Iron deficiency anemia 05/09/2016  . Statin-induced rhabdomyolysis 05/07/2016  . Liver cirrhosis ([place]) 04/05/2016  . PVC's (premature ventricular contractions) 04/05/2016  . CAD (coronary artery disease) 02/16/2016  . S/P CABG x 3 02/02/2016  . History of non-ST elevation myocardial infarction (NSTEMI) 01/27/2016  . DM (diabetes mellitus), type 2, uncontrolled ([place]) 12/15/2015  . Trochanteric bursitis of left hip 12/15/2015  . S/P lumbar spinal fusion 06/10/2015  . Paresthesia 04/01/2015  . Osteoporosis 03/31/2015  . Hearing loss 06/25/2014  . Insomnia 06/25/2014  . Hair thinning 06/25/2014  . Muscle spasm 10/08/2013  .  Biceps tendon rupture, proximal 11/24/2012  . Hyperlipemia   . GERD (gastroesophageal reflux disease)   . DM (diabetes mellitus) ([place])   . Essential hypertension     Past Surgical History:  Procedure Laterality Date  . BREAST BIOPSY    . CARDIAC CATHETERIZATION N/A 01/27/2016   Procedure: Left Heart Cath and Coronary Angiography;  Surgeon: Belva Crome, MD;  Location: [facility];  Service: Cardiovascular;  Laterality: N/A;  . COLONOSCOPY  2012   Dr Lajoyce Corners.   . COLONOSCOPY WITH PROPOFOL N/A 07/07/2016   Procedure: COLONOSCOPY WITH PROPOFOL;  Surgeon:  [name], MD;  Location: [facility];  Service: Endoscopy;  Laterality: N/A;  . CORONARY ARTERY BYPASS GRAFT N/A 01/28/2016   Procedure: CORONARY ARTERY BYPASS GRAFTING (CABG) x 3 USING RIGHT LEG GREATER SAPHENOUS VEIN GRAFT;  Surgeon: Melrose Nakayama, MD;  Location: [facility];  Service: Open Heart Surgery;  Laterality: N/A;  . ENDOVEIN HARVEST OF GREATER SAPHENOUS VEIN Right 01/28/2016   Procedure: ENDOVEIN HARVEST OF GREATER SAPHENOUS VEIN;  Surgeon: Melrose Nakayama, MD;  Location: [facility];  Service: Open Heart Surgery;  Laterality: Right;  . ESOPHAGEAL BANDING  03/28/2019   Procedure: ESOPHAGEAL BANDING;  Surgeon: [name], MD;  Location: [facility];  Service: Endoscopy;;  . ESOPHAGEAL BANDING  04/07/2019   Procedure: ESOPHAGEAL BANDING;  Surgeon: Juanita Craver, MD;  Location: [facility] ENDOSCOPY;  Service: Endoscopy;;  . ESOPHAGOGASTRODUODENOSCOPY N/A 04/07/2019   Procedure: ESOPHAGOGASTRODUODENOSCOPY (EGD);  Surgeon: Juanita Craver, MD;  Location: [facility] ENDOSCOPY;  Service: Endoscopy;  Laterality: N/A;  . ESOPHAGOGASTRODUODENOSCOPY (EGD) WITH PROPOFOL N/A 07/07/2016   Procedure: ESOPHAGOGASTRODUODENOSCOPY (EGD) WITH PROPOFOL;  Surgeon: [name], MD;  Location: [facility];  Service: Endoscopy;  Laterality: N/A;  . ESOPHAGOGASTRODUODENOSCOPY (EGD) WITH PROPOFOL N/A 03/28/2019   Procedure: ESOPHAGOGASTRODUODENOSCOPY (EGD) WITH PROPOFOL;  Surgeon: [name], MD;  Location: [facility];  Service: Endoscopy;  Laterality: N/A;  . HEMOSTASIS CLIP PLACEMENT  04/07/2019   Procedure: HEMOSTASIS CLIP PLACEMENT;  Surgeon: Juanita Craver, MD;  Location: [facility] ENDOSCOPY;  Service: Endoscopy;;  . IR ANGIOGRAM SELECTIVE EACH ADDITIONAL VESSEL  04/08/2019  . IR EMBO ART  VEN HEMORR LYMPH EXTRAV  INC GUIDE ROADMAPPING  04/08/2019  . IR PARACENTESIS  04/08/2019  . IR RADIOLOGIST EVAL & MGMT  06/13/2019  . IR TIPS  04/08/2019  . MAXIMUM ACCESS (MAS)POSTERIOR LUMBAR INTERBODY FUSION (PLIF) 1  LEVEL Left 06/10/2015   Procedure: FOR MAXIMUM ACCESS (MAS) POSTERIOR LUMBAR INTERBODY FUSION (PLIF) LUMBAR THREE-FOUR EXTRAFORAMINAL MICRODISCECTOMY LUMBAR FIVE-SACRAL ONE LEFT;  Surgeon: Eustace Moore, MD;  Location: [facility] NEURO ORS;  Service: Neurosurgery;  Laterality: Left;  . RADIOLOGY WITH ANESTHESIA N/A 04/08/2019   Procedure: RADIOLOGY WITH ANESTHESIA;  Surgeon: Radiologist, Medication, MD;  Location: [facility];  Service: Radiology;  Laterality: N/A;  . SCLEROTHERAPY  04/07/2019   Procedure: SCLEROTHERAPY;  Surgeon: Juanita Craver, MD;  Location: [facility] ENDOSCOPY;  Service: Endoscopy;;  . TEE WITHOUT CARDIOVERSION N/A 01/28/2016   Procedure: TRANSESOPHAGEAL ECHOCARDIOGRAM (TEE);  Surgeon: Melrose Nakayama, MD;  Location: [facility];  Service: Open Heart Surgery;  Laterality: N/A;  . TUBAL LIGATION  1982   Dr Connye Burkitt  . UPPER GASTROINTESTINAL ENDOSCOPY    . VAGINAL HYSTERECTOMY  1997   Dr Rande Lawman    OB History   No obstetric history on file.      Home Medications    Prior to Admission medications   Medication Sig Start Date End Date Taking? Authorizing Provider  acetaminophen (  TYLENOL) 500 MG tablet Take 500 mg by mouth at bedtime.     [provider]  BD PEN NEEDLE NANO U/F 32G X 4 MM MISC USE THREE TIMES DAILY AS DIRECTED 11/26/18   Reed, Tiffany L, DO  Biotin 10000 MCG TABS Take 10,000 mcg by mouth every morning.    [provider]  Cyanocobalamin (VITAMIN B 12 PO) Take 1,000 mcg by mouth daily.      [provider]  ezetimibe (ZETIA) 10 MG tablet Take one tablet by mouth once daily 07/12/19   Reed, Tiffany L, DO  furosemide (LASIX) 20 MG tablet Take 1 tablet (20 mg total) by mouth daily. Hold if B/p < 110/60 06/12/19   Ngetich, Dinah C, NP  glucose blood test strip One Touch Ultra II strips. Use to test blood sugar three times daily. Dx: E11.65 06/01/17   Reed, Tiffany L, DO  insulin aspart (NOVOLOG FLEXPEN) 100 UNIT/ML FlexPen 12 units at breakfast, 8 units at lunch and  12 at dinner    [provider]  insulin detemir (LEVEMIR FLEXTOUCH) 100 UNIT/ML FlexPen Inject 30 Units into the skin daily.    [provider]  Insulin Syringe-Needle U-100 (INSULIN SYRINGE 1CC/31GX5/16") 31G X 5/16" 1 ML MISC USE AS DIRECTED DAILY WITH LEVEMIR. Dx: E11.65 07/29/19   Reed, Tiffany L, DO  JARDIANCE 25 MG TABS tablet TAKE 1 TABLET BY MOUTH DAILY 06/28/19   Reed, Tiffany L, DO  loratadine (CLARITIN) 10 MG tablet Take 10 mg by mouth daily as needed for allergies.    [provider]  MAGNESIUM PO Take 500 mg by mouth daily.     [provider]  Multiple Vitamins-Minerals (MULTIVITAMIN WITH MINERALS) tablet Take 1 tablet by mouth daily.      [provider]  ondansetron (ZOFRAN) 4 MG tablet Take 1 tablet (4 mg total) by mouth every 6 (six) hours as needed for nausea. 05/18/19   Desiree Hane, MD  pantoprazole (PROTONIX) 40 MG tablet Take 40 mg by mouth 2 (two) times daily.    [provider]  Polyethyl Glycol-Propyl Glycol (SYSTANE OP) Place 1 drop into both eyes daily.     [provider]  Probiotic Product (PROBIOTIC DAILY PO) Take 1 capsule by mouth daily. Digestive Advantage Probiotic    [provider]  rifaximin (XIFAXAN) 550 MG TABS tablet Take 1 tablet (550 mg total) by mouth 2 (two) times daily. 06/05/19   [name], MD    Family History Family History  Problem Relation Age of Onset  . Lung cancer Father   . Arthritis Sister   . Arthritis Brother   . Liver cancer Brother   . Heart disease Maternal Grandmother   . Heart disease Maternal Grandfather   . Heart disease Paternal Grandmother   . Heart disease Paternal Grandfather   . Breast cancer Mother   . Other Daughter        house fire  . Breast cancer Maternal Aunt   . Breast cancer Paternal Aunt   . Colon cancer Neg Hx   . Esophageal cancer Neg Hx   . Rectal cancer Neg Hx   . Stomach cancer Neg Hx     Social History Social History    Tobacco Use  . Smoking status: Never Smoker  . Smokeless tobacco: Never Used  Substance Use Topics  . Alcohol use: No  . Drug use: No     Allergies   Kiwi extract, Tdap [tetanus-diphth-acell pertussis], Statins,  Latex, and Tramadol   Review of Systems Review of Systems  Constitutional: Negative for fatigue and fever.  HENT: Negative for congestion, sinus pressure and sore throat.   Eyes: Negative for photophobia, pain and visual disturbance.  Respiratory: Positive for shortness of breath. Negative for cough.   Cardiovascular: Positive for chest pain.  Gastrointestinal: Negative for abdominal pain, nausea and vomiting.  Genitourinary: Negative for decreased urine volume and hematuria.  Musculoskeletal: Negative for myalgias, neck pain and neck stiffness.  Neurological: Negative for dizziness, syncope, facial asymmetry, speech difficulty, weakness, light-headedness, numbness and headaches.     Physical Exam Triage Vital Signs ED Triage Vitals  Enc Vitals Group     BP      Pulse      Resp      Temp      Temp src      SpO2      Weight      Height      Head Circumference      Peak Flow      Pain Score      Pain Loc      Pain Edu?      Excl. in [place]?    No data found.  Updated Vital Signs There were no vitals taken for this visit. BP 111/60; Hr irregular 130; O2 100% Visual Acuity Right Eye Distance:   Left Eye Distance:   Bilateral Distance:    Right Eye Near:   Left Eye Near:    Bilateral Near:     Physical Exam Vitals and nursing note reviewed.  Constitutional:      Appearance: She is well-developed.     Comments: No acute distress  HENT:     Head: Normocephalic and atraumatic.     Nose: Nose normal.  Eyes:     Conjunctiva/sclera: Conjunctivae normal.  Cardiovascular:     Rate and Rhythm: Tachycardia present. Rhythm irregular.  Pulmonary:     Effort: Pulmonary effort is normal. No respiratory distress.     Comments: Breathing comfortably at  rest, CTABL, no wheezing, rales or other adventitious sounds auscultated Abdominal:     General: There is no distension.  Musculoskeletal:        General: Normal range of motion.     Cervical back: Neck supple.  Skin:    General: Skin is warm and dry.  Neurological:     Mental Status: She is alert and oriented to person, place, and time.      UC Treatments / Results  Labs (all labs ordered are listed, but only abnormal results are displayed) Labs Reviewed - No data to display  EKG   Radiology No results found.  Procedures Procedures (including critical care time)  Medications Ordered in UC Medications - No data to display  Initial Impression / Assessment and Plan / UC Course  I have reviewed the triage vital signs and the nursing notes.  Pertinent labs & imaging results that were available during my care of the patient were reviewed by me and considered in my medical decision making (see chart for details).  Clinical Course as of Aug 19 1018  Mon Aug 19, 2019  0954 HR 135-150's BP 11/60 O2 100%   [HW]  0954 HR improved to 80's briefly   [HW]    Clinical Course User Index [HW] Johanna Matto C, PA-C    A. fib with RVR, new onset, sending to emergency room for further evaluation.  Transported with wheelchair with nursing staff. Patient  stable.    Final Clinical Impressions(s) / UC Diagnoses   Final diagnoses:  Chest pain, unspecified type  Atrial fibrillation, unspecified type [facility])   Discharge Instructions   None    ED Prescriptions    None     PDMP not reviewed this encounter.   Nahima Ales, Ulen C, PA-C 08/19/19 1021

## 2019-08-19 NOTE — H&P (Addendum)
Cardiology Admission History and Physical:   Patient ID: [name] MRN: [id number]; DOB: 05-Dec-1945   Admission date: 08/19/2019  Primary Care Provider: Gayland Curry, DO Primary Cardiologist: Kayla Rouge, MD  Primary Electrophysiologist:  None   Chief Complaint:  Chest pain, Afib RVR  Patient Profile:   [name] is a 74 y.o. female with pmh of HTN, HLD, DM2, CAD s/p CABG in 2017 EF 60-65% 02/2016, ascites/cirrhosis, B/L carotid artery disease who is being seen for chest pain and afib RVR.   History of Present Illness:   [name] had a STEMI in 2017 followed by CABG, LIMA-LAD, SVG- IM, SVG -PDA. EF at the time 55-60%. Patient had post-op ascites and cirrhosis. She is s/p multiple paracentesis and followed by GI. BB changed to nadolol. She tales spironolactone and lasix. Last saw Dr. Johnsie Conway 07/2018 and was stable from a cardiac standpoint.    The patient presented to the ED 08/19/19 for chest pain. Patient said symptoms started about 3 days ago. They started while at rest. Chest pain was middle to right sided and sharp in nature. It was worse with taking a deep breath. Not worse with exertion. Chest pain has been waxing and waning. It occasionally radiates into her neck. She had associated shortness of breath. No N/V or diaphoresis. No palpitations. She also has been generally feeling very weak.  Denies recent illness, fever, or chills. This morning the pain woke the patient up from sleep, 8/10. She went to an urgent care was found to have new onset A. fib and was sent to the ED for evaluation. She reported 2 months ago she had banding for her esophageal varices and since then has had intermittent swelling. She takes Lasix 20 mg daily. Spironolatone previously discontinued for worsening kidney function.   In the ED blood pressure 107/60, afebrile, 99% O2, respiratory rate 14.  Edema noted on exam.  Labs showed potassium 3.3, glucose 164, creatinine 0.96, platelets 102. HS  troponin 24>27. BNP 264. Initial EKG with Afib RVR, rates in the 120s. In the ED patient spontaneous converted to sinus rhythm. Follow up EKG shows sinus rates in the 80s. CXR unremarkable.  Patient was given IV Lasix and started on IV heparin. Cardiology was consulted for possible admission.  Patient denies alcohol, tobacco, drug use. Patient still has 3/10 chest pain   Past Medical History:  Diagnosis Date  . Allergy   . Arthritis    neck  . Cataract    bilateral - MD monitoring cataracts  . CHF (congestive heart failure) ([place])   . Chronic kidney disease, stage I    DR OTTELIN  HX UTIS  . Cirrhosis ([place])   . Cramp of limb   . Diabetes mellitus   . Dysphagia, unspecified(787.20)   . Dysuria   . Epistaxis   . GERD (gastroesophageal reflux disease)   . Heart murmur    NO CARDIOLOGIST  DX FOR YEARS ASYMPTOMATIC  . Hepatic encephalopathy ([place])   . Lumbago   . Neoplasm of uncertain behavior of skin   . Nonspecific elevation of levels of transaminase or lactic acid dehydrogenase (LDH)   . Osteoarthrosis, unspecified whether generalized or localized, unspecified site   . Other and unspecified hyperlipidemia    diet controlled  . Pain in joint, shoulder region   . Paresthesias 04/01/2015  . Postablative ovarian failure   . Trochanteric bursitis of left hip 12/15/2015  . Type 2 diabetes mellitus without complication ([place])   . Unspecified  essential hypertension    no meds    Past Surgical History:  Procedure Laterality Date  . BREAST BIOPSY    . CARDIAC CATHETERIZATION N/A 01/27/2016   Procedure: Left Heart Cath and Coronary Angiography;  Surgeon: Belva Crome, MD;  Location: [facility];  Service: Cardiovascular;  Laterality: N/A;  . COLONOSCOPY  2012   Dr Lajoyce Corners.   . COLONOSCOPY WITH PROPOFOL N/A 07/07/2016   Procedure: COLONOSCOPY WITH PROPOFOL;  Surgeon: [name], MD;  Location: [facility];  Service: Endoscopy;  Laterality: N/A;  . CORONARY ARTERY BYPASS GRAFT N/A  01/28/2016   Procedure: CORONARY ARTERY BYPASS GRAFTING (CABG) x 3 USING RIGHT LEG GREATER SAPHENOUS VEIN GRAFT;  Surgeon: Melrose Nakayama, MD;  Location: [facility];  Service: Open Heart Surgery;  Laterality: N/A;  . ENDOVEIN HARVEST OF GREATER SAPHENOUS VEIN Right 01/28/2016   Procedure: ENDOVEIN HARVEST OF GREATER SAPHENOUS VEIN;  Surgeon: Melrose Nakayama, MD;  Location: [facility];  Service: Open Heart Surgery;  Laterality: Right;  . ESOPHAGEAL BANDING  03/28/2019   Procedure: ESOPHAGEAL BANDING;  Surgeon: [name], MD;  Location: [facility];  Service: Endoscopy;;  . ESOPHAGEAL BANDING  04/07/2019   Procedure: ESOPHAGEAL BANDING;  Surgeon: Juanita Craver, MD;  Location: [facility] ENDOSCOPY;  Service: Endoscopy;;  . ESOPHAGOGASTRODUODENOSCOPY N/A 04/07/2019   Procedure: ESOPHAGOGASTRODUODENOSCOPY (EGD);  Surgeon: Juanita Craver, MD;  Location: [facility] ENDOSCOPY;  Service: Endoscopy;  Laterality: N/A;  . ESOPHAGOGASTRODUODENOSCOPY (EGD) WITH PROPOFOL N/A 07/07/2016   Procedure: ESOPHAGOGASTRODUODENOSCOPY (EGD) WITH PROPOFOL;  Surgeon: [name], MD;  Location: [facility];  Service: Endoscopy;  Laterality: N/A;  . ESOPHAGOGASTRODUODENOSCOPY (EGD) WITH PROPOFOL N/A 03/28/2019   Procedure: ESOPHAGOGASTRODUODENOSCOPY (EGD) WITH PROPOFOL;  Surgeon: [name], MD;  Location: [facility];  Service: Endoscopy;  Laterality: N/A;  . HEMOSTASIS CLIP PLACEMENT  04/07/2019   Procedure: HEMOSTASIS CLIP PLACEMENT;  Surgeon: Juanita Craver, MD;  Location: [place] ENDOSCOPY;  Service: Endoscopy;;  . IR ANGIOGRAM SELECTIVE EACH ADDITIONAL VESSEL  04/08/2019  . IR EMBO ART  VEN HEMORR LYMPH EXTRAV  INC GUIDE ROADMAPPING  04/08/2019  . IR PARACENTESIS  04/08/2019  . IR RADIOLOGIST EVAL & MGMT  06/13/2019  . IR TIPS  04/08/2019  . MAXIMUM ACCESS (MAS)POSTERIOR LUMBAR INTERBODY FUSION (PLIF) 1 LEVEL Left 06/10/2015   Procedure: FOR MAXIMUM ACCESS (MAS) POSTERIOR LUMBAR INTERBODY FUSION (PLIF) LUMBAR THREE-FOUR  EXTRAFORAMINAL MICRODISCECTOMY LUMBAR FIVE-SACRAL ONE LEFT;  Surgeon: Eustace Moore, MD;  Location: [facility] NEURO ORS;  Service: Neurosurgery;  Laterality: Left;  . RADIOLOGY WITH ANESTHESIA N/A 04/08/2019   Procedure: RADIOLOGY WITH ANESTHESIA;  Surgeon: Radiologist, Medication, MD;  Location: [facility];  Service: Radiology;  Laterality: N/A;  . SCLEROTHERAPY  04/07/2019   Procedure: SCLEROTHERAPY;  Surgeon: Juanita Craver, MD;  Location: [facility] ENDOSCOPY;  Service: Endoscopy;;  . TEE WITHOUT CARDIOVERSION N/A 01/28/2016   Procedure: TRANSESOPHAGEAL ECHOCARDIOGRAM (TEE);  Surgeon: Melrose Nakayama, MD;  Location: [place];  Service: Open Heart Surgery;  Laterality: N/A;  . TUBAL LIGATION  1982   Dr Connye Burkitt  . UPPER GASTROINTESTINAL ENDOSCOPY    . VAGINAL HYSTERECTOMY  1997   Dr Rande Lawman     Medications Prior to Admission: Prior to Admission medications   Medication Sig Start Date End Date Taking? Authorizing Provider  acetaminophen (TYLENOL) 500 MG tablet Take 500 mg by mouth at bedtime.     [provider]  BD PEN NEEDLE NANO U/F 32G X 4 MM MISC USE THREE TIMES DAILY AS DIRECTED  11/26/18   Reed, Tiffany L, DO  Biotin 10000 MCG TABS Take 10,000 mcg by mouth every morning.    [provider]  Cyanocobalamin (VITAMIN B 12 PO) Take 1,000 mcg by mouth daily.      [provider]  ezetimibe (ZETIA) 10 MG tablet Take one tablet by mouth once daily 07/12/19   Reed, Tiffany L, DO  furosemide (LASIX) 20 MG tablet Take 1 tablet (20 mg total) by mouth daily. Hold if B/p < 110/60 06/12/19   Ngetich, Dinah C, NP  glucose blood test strip One Touch Ultra II strips. Use to test blood sugar three times daily. Dx: E11.65 06/01/17   Reed, Tiffany L, DO  insulin aspart (NOVOLOG FLEXPEN) 100 UNIT/ML FlexPen 12 units at breakfast, 8 units at lunch and 12 at dinner    [provider]  insulin detemir (LEVEMIR FLEXTOUCH) 100 UNIT/ML FlexPen Inject 30 Units into the skin daily.    [provider]  Insulin Syringe-Needle U-100 (INSULIN SYRINGE 1CC/31GX5/16") 31G X 5/16" 1 ML MISC USE AS DIRECTED DAILY WITH LEVEMIR. Dx: E11.65 07/29/19   Reed, Tiffany L, DO  JARDIANCE 25 MG TABS tablet TAKE 1 TABLET BY MOUTH DAILY 06/28/19   Reed, Tiffany L, DO  loratadine (CLARITIN) 10 MG tablet Take 10 mg by mouth daily as needed for allergies.    [provider]  MAGNESIUM PO Take 500 mg by mouth daily.     [provider]  Multiple Vitamins-Minerals (MULTIVITAMIN WITH MINERALS) tablet Take 1 tablet by mouth daily.      [provider]  ondansetron (ZOFRAN) 4 MG tablet Take 1 tablet (4 mg total) by mouth every 6 (six) hours as needed for nausea. 05/18/19   Desiree Hane, MD  pantoprazole (PROTONIX) 40 MG tablet Take 40 mg by mouth 2 (two) times daily.    [provider]  Polyethyl Glycol-Propyl Glycol (SYSTANE OP) Place 1 drop into both eyes daily.     [provider]  Probiotic Product (PROBIOTIC DAILY PO) Take 1 capsule by mouth daily. Digestive Advantage Probiotic    [provider]  rifaximin (XIFAXAN) 550 MG TABS tablet Take 1 tablet (550 mg total) by mouth 2 (two) times daily. 06/05/19   [name], MD     Allergies:    Allergies  Allergen Reactions  . Kiwi Extract Anaphylaxis  . Tdap [Tetanus-Diphth-Acell Pertussis] Swelling and Other (See Comments)    Swelling at injection site, gets very hot  . Statins Other (See Comments)    RHABDOMYOLYSIS  . Latex Itching, Dermatitis and Rash  . Tramadol Nausea And Vomiting    Social History:   Social History   Socioeconomic History  . Marital status: Married    Spouse name: Not on file  . Number of children: 6  . Years of education: Not on file  . Highest education level: Not on file  Occupational History  . Occupation: retired  Tobacco Use  . Smoking status: Never Smoker  . Smokeless tobacco: Never Used  Substance and Sexual Activity  . Alcohol use: No  . Drug use: No  .  Sexual activity: Yes    Partners: Male    Birth control/protection: Post-menopausal, Surgical    Comment: Hysterectomy  Other Topics Concern  . Not on file  Social History Narrative   She is retired she is married and lives with her husband   6 children   No alcohol or tobacco never smoker no drug use  Social Determinants of Health   Financial Resource Strain:   . Difficulty of Paying Living Expenses:   Food Insecurity: No Food Insecurity  . Worried About Charity fundraiser in the Last Year: Never true  . Ran Out of Food in the Last Year: Never true  Transportation Needs: No Transportation Needs  . Lack of Transportation (Medical): No  . Lack of Transportation (Non-Medical): No  Physical Activity:   . Days of Exercise per Week:   . Minutes of Exercise per Session:   Stress:   . Feeling of Stress :   Social Connections:   . Frequency of Communication with Friends and Family:   . Frequency of Social Gatherings with Friends and Family:   . Attends Religious Services:   . Active Member of Clubs or Organizations:   . Attends Archivist Meetings:   [name] Kitchen Marital Status:   Intimate Partner Violence:   . Fear of Current or Ex-Partner:   . Emotionally Abused:   [name] Kitchen Physically Abused:   . Sexually Abused:     Family History:   The patient's family history includes Arthritis in her brother and sister; Breast cancer in her maternal aunt, mother, and paternal aunt; Heart disease in her maternal grandfather, maternal grandmother, paternal grandfather, and paternal grandmother; Liver cancer in her brother; Lung cancer in her father; Other in her daughter. There is no history of Colon cancer, Esophageal cancer, Rectal cancer, or Stomach cancer.    ROS:  Please see the history of present illness.  All other ROS reviewed and negative.     Physical Exam/Data:   Vitals:   08/19/19 1034 08/19/19 1515 08/19/19 1530  BP: 107/60 (!) 139/57 (!) 144/58  Pulse: (!) 45 80 81  Resp: 14  (!) 26 (!) 26  Temp: 98.2 F (36.8 C)    TempSrc: Oral    SpO2: 99% 98% 97%   No intake or output data in the 24 hours ending 08/19/19 1613 Last 3 Weights 08/02/2019 06/18/2019 06/12/2019  Weight (lbs) 143 lb 139 lb 140 lb  Weight (kg) 64.864 kg 63.05 kg 63.504 kg     There is no height or weight on file to calculate BMI.  General:  Well nourished, well developed, in no acute distress HEENT: normal Lymph: no adenopathy Neck: minimal JVD Endocrine:  No thryomegaly Vascular: No carotid bruits; FA pulses 2+ bilaterally without bruits  Cardiac:  normal S1, S2; RRR; + murmur  Lungs:  clear to auscultation bilaterally, no wheezing, rhonchi or rales  Abd: soft, nontender, no hepatomegaly  Ext: 1-2+ edema Musculoskeletal:  No deformities, BUE and BLE strength normal and equal Skin: warm and dry  Neuro:  CNs 2-12 intact, no focal abnormalities noted Psych:  Normal affect    EKG:  The ECG that was done/  was personally reviewed and demonstrates sinus rates in the 80s  Relevant CV Studies:  Echo 02/2016 Study Conclusions   - Left ventricle: The cavity size was normal. There was mild  concentric hypertrophy. Systolic function was normal. The  estimated ejection fraction was in the range of 60% to 65%. Wall  motion was normal; there were no regional wall motion  abnormalities. There was an increased relative contribution of  atrial contraction to ventricular filling. Doppler parameters are  consistent with abnormal left ventricular relaxation (grade 1  diastolic dysfunction).  - Aortic valve: Trileaflet; normal thickness leaflets. Although the  mean AVG and peak velocity are consistent with very mild  stenosis, the aortic  valve opens normally. There was mild  regurgitation. Peak velocity (S): 247 cm/s. Mean gradient (S): 12  mm Hg. Valve area (VTI): 1.24 cm^2. Valve area (Vmax): 1.18 cm^2.  Valve area (Vmean): 1.12 cm^2. Regurgitation pressure half-time:  508  ms.  - Mitral valve: Calcified annulus. There was mild regurgitation.  - Left atrium: The atrium was mildly to moderately dilated.  - Pulmonary arteries: PA peak pressure: 38 mm Hg (S).  - Pericardium, extracardiac: There was a left pleural effusion.   Cardiac cath 01/2016  Prox RCA lesion, 75 %stenosed.  Ramus lesion, 95 %stenosed.  Prox LAD to Mid LAD lesion, 90 %stenosed.  Mid RCA to Dist RCA lesion, 100 %stenosed.  Dist RCA lesion, 100 %stenosed.  The left ventricular ejection fraction is 45-50% by visual estimate.  The left ventricular systolic function is normal.  LV end diastolic pressure is mildly elevated.    Acute coronary syndrome with recent total occlusion of the dominant RCA, high-grade obstruction in the proximal LAD which Collateralizes the RCA, and high-grade obstruction and a moderate size ramus intermedius branch.  Left ventricular systolic dysfunction with inferobasal moderate hypokinesis and EF of 45-50%. Upper normal LV EDP.   RECOMMENDATIONS:  Long discussion with the patient concerning treatment options which include multivessel PCI / stenting vs coronary bypass grafting with LIMA to the LAD. If PCI, RCA will require near full metal jacket. After discussion, the patient would like the approach that will give her the most durable long-term success.  IV nitroglycerin, IV fluid, high intensity statin therapy, aspirin therapy, and beta blocker therapy as tolerated.  Expedited CABG because of presentation and critical nature of disease.     High Sensitivity Troponin:   Recent Labs  Lab 08/19/19 1127  TROPONINIHS 24*      Chemistry Recent Labs  Lab 08/19/19 1127  NA 140  K 3.3*  CL 104  CO2 25  GLUCOSE 164*  BUN 19  CREATININE 0.96  CALCIUM 9.5  GFRNONAA 59*  GFRAA >60  ANIONGAP 11    No results for input(s): PROT, ALBUMIN, AST, ALT, ALKPHOS, BILITOT in the last 168 hours. Hematology Recent Labs  Lab 08/19/19 1127  WBC 6.4  RBC  4.26  HGB 13.0  HCT 40.7  MCV 95.5  MCH 30.5  MCHC 31.9  RDW 14.7  PLT 102*   BNPNo results for input(s): BNP, PROBNP in the last 168 hours.  DDimer No results for input(s): DDIMER in the last 168 hours.   Radiology/Studies:  DG Chest 2 View  Result Date: 08/19/2019 CLINICAL DATA:  Shortness of breath. EXAM: CHEST - 2 VIEW COMPARISON:  May 26, 2019. FINDINGS: The heart size and mediastinal contours are within normal limits. Both lungs are clear. No pneumothorax or pleural effusion is noted. Status post coronary bypass graft. The visualized skeletal structures are unremarkable. IMPRESSION: No active cardiopulmonary disease. Electronically Signed   By: Marijo Conception M.D.   On: 08/19/2019 11:49    HEAR Score (for undifferentiated chest pain):  HEAR Score: 4    Assessment and Plan:   New onset atrial fibrillation Patient presented with 3 days of atypical/pleuritic chest pain with associated sob worse. In an urgent care patient was noted to be in A. fib RVR and sent to the ED for evaluation.  Patient spontaneously converted to sinus in the ED.  Potassium 3.3. BNP 264 HS trop 24>27. Platelets 102. CXR unremarkable.  - Admit to telemetry - TSH 3.7 - check MAG - goal K+ >4  and mag > 2 - CHADSVASC = 6 (female, age, HTN, CAD, DM2, CHF).  Patient needs long-term anticoagulation, started on IV heparin. Will likely with Eliquis.  Creatinine is stable. -Check echo.  EF in 2017 was 60-65%. Echo 2017 with mild to mod LA dilation - Start Lopressor 12.5 mg BID. Patient was previously on nadolol  Chest pain/CAD s/p CABGx 3 - chest pain in the setting of the above - Hs troponin mildly elevated 24>27. Trend flat. Continue to trend - continue BB, zetia - Do not suspect we will need a repeat cath  Acute on chronic Chronic Diastolic HF - BNP mildly up at 264. Patient also has h/o of cirrhosis with ascitics and esophageal varices. She has been having lower leg edema over the last 2 months worse  in the last couple days - EF preserved in 2017 - repeat echo - takes lasix 20 mg daily at home for intermittent swelling - creatinine at baseline - Will start IV lasix 20 mg BID - monitor daily weights, strict I/Os, and creatinine with diuretics  HLD - Zetia - no statin 2/2 to cirrhosis  HTN - lasix 20 mg daily at home - start Lopressor as above  Heart murmur - Echo in 2017 with mild AS and mild AI, mean gradient 12 mmHgecho as above  DM2 - SSI  Hypokalemia - will supplement - goal >4  Severity of Illness: The appropriate patient status for this patient is OBSERVATION. Observation status is judged to be reasonable and necessary in order to provide the required intensity of service to ensure the patient's safety. The patient's presenting symptoms, physical exam findings, and initial radiographic and laboratory data in the context of their medical condition is felt to place them at decreased risk for further clinical deterioration. Furthermore, it is anticipated that the patient will be medically stable for discharge from the hospital within 2 midnights of admission. The following factors support the patient status of observation.   " The patient's presenting symptoms include chest pain. " The physical exam findings include chest pain. " The initial radiographic and laboratory data are Afib RVR.     For questions or updates, please contact [facility] Please consult [email or web address] for contact info under        Signed, Becci Batty Ninfa Meeker, PA-C  08/19/2019 4:13 PM

## 2019-08-19 NOTE — ED Notes (Signed)
Patient is being discharged from the [facility] and sent to the Emergency Department via wheelchair by staff. Per [name], patient is stable but in need of higher level of care due to Afib RVR. Patient is aware and verbalizes understanding of plan of care. There were no vitals filed for this visit.

## 2019-08-19 NOTE — ED Triage Notes (Signed)
Pt reports having ongoing chest pressure and then woke up this am with right side neck pain. Went to [facility] this am and sent here due to new onset afib. Reports sob, feels difficult to take a deep breath. Airway is intact and no resp distress noted at triage,

## 2019-08-19 NOTE — ED Provider Notes (Signed)
Generally [facility] EMERGENCY DEPARTMENT Provider Note   CSN: [id number] Arrival date & time: 08/19/19  1028     History Chief Complaint  Patient presents with  . Neck Pain  . Shortness of Breath    [name] is a 74 y.o. female.  74 yo F with a chief complaints of fatigue and right-sided chest pain that radiates up into the neck.  This been going on for about 3 days now.  Feels like she has trouble taking a deep breath.  Worse on exertion and feeling very weak.  She went to urgent care today and was found to have new onset atrial fibrillation and was sent to the ED for evaluation.  Patient denies cough denies fever denies abdominal pain vomiting or diarrhea.  Denies diaphoresis.  Has been eating and drinking normally.  The history is provided by the patient.  Neck Pain Associated symptoms: no chest pain, no fever and no headaches   Shortness of Breath Associated symptoms: neck pain   Associated symptoms: no chest pain, no fever, no headaches, no vomiting and no wheezing   Illness Severity:  Moderate Onset quality:  Gradual Duration:  3 days Timing:  Constant Progression:  Worsening Chronicity:  New Associated symptoms: fatigue and shortness of breath   Associated symptoms: no chest pain, no congestion, no fever, no headaches, no myalgias, no nausea, no rhinorrhea, no vomiting and no wheezing        Past Medical History:  Diagnosis Date  . Allergy   . Arthritis    neck  . Cataract    bilateral - MD monitoring cataracts  . CHF (congestive heart failure) ([place])   . Chronic kidney disease, stage I    DR OTTELIN  HX UTIS  . Cirrhosis ([place])   . Cramp of limb   . Diabetes mellitus   . Dysphagia, unspecified(787.20)   . Dysuria   . Epistaxis   . GERD (gastroesophageal reflux disease)   . Heart murmur    NO CARDIOLOGIST  DX FOR YEARS ASYMPTOMATIC  . Hepatic encephalopathy ([place])   . Lumbago   . Neoplasm of uncertain behavior of skin   .  Nonspecific elevation of levels of transaminase or lactic acid dehydrogenase (LDH)   . Osteoarthrosis, unspecified whether generalized or localized, unspecified site   . Other and unspecified hyperlipidemia    diet controlled  . Pain in joint, shoulder region   . Paresthesias 04/01/2015  . Postablative ovarian failure   . Trochanteric bursitis of left hip 12/15/2015  . Type 2 diabetes mellitus without complication ([place])   . Unspecified essential hypertension    no meds    Patient Active Problem List   Diagnosis Date Noted  . S/P TIPS (transjugular intrahepatic portosystemic shunt)   . Hypercalcemia 05/26/2019  . AKI (acute kidney injury) ([place])   . Hyponatremia   . Acute lower UTI 05/16/2019  . Acute delirium   . General weakness 05/15/2019  . Encephalopathy, hepatic ([place]) 05/15/2019  . Volume depletion 05/15/2019  . Bleeding esophageal varices ([place])   . UGI bleed   . Infarction of spleen 04/06/2019  . Odynophagia 04/01/2019  . Dysphagia   . Hepatoma ([place]) 01/31/2019  . Coronary artery disease of native artery of native heart with stable angina pectoris ([place]) 01/31/2019  . Thrombocytopenia ([place]) 09/25/2017  . Left forearm pain 05/29/2017  . Portal vein thrombosis 04/22/2017  . Skin cancer of nose 08/23/2016  . History of colonic polyps   .  Benign neoplasm of rectum   . Esophageal varices without bleeding ([place])   . Portal hypertensive gastropathy ([place])   . Leg cramps 05/18/2016  . Routine lab draw 05/13/2016  . Iron deficiency anemia 05/09/2016  . Statin-induced rhabdomyolysis 05/07/2016  . Liver cirrhosis ([place]) 04/05/2016  . PVC's (premature ventricular contractions) 04/05/2016  . CAD (coronary artery disease) 02/16/2016  . S/P CABG x 3 02/02/2016  . History of non-ST elevation myocardial infarction (NSTEMI) 01/27/2016  . DM (diabetes mellitus), type 2, uncontrolled ([place]) 12/15/2015  . Trochanteric bursitis of left hip 12/15/2015  . S/P lumbar spinal fusion 06/10/2015    . Paresthesia 04/01/2015  . Osteoporosis 03/31/2015  . Hearing loss 06/25/2014  . Insomnia 06/25/2014  . Hair thinning 06/25/2014  . Muscle spasm 10/08/2013  . Biceps tendon rupture, proximal 11/24/2012  . Hyperlipemia   . GERD (gastroesophageal reflux disease)   . DM (diabetes mellitus) ([place])   . Essential hypertension     Past Surgical History:  Procedure Laterality Date  . BREAST BIOPSY    . CARDIAC CATHETERIZATION N/A 01/27/2016   Procedure: Left Heart Cath and Coronary Angiography;  Surgeon: Belva Crome, MD;  Location: [facility] CV LAB;  Service: Cardiovascular;  Laterality: N/A;  . COLONOSCOPY  2012   Dr Lajoyce Corners.   . COLONOSCOPY WITH PROPOFOL N/A 07/07/2016   Procedure: COLONOSCOPY WITH PROPOFOL;  Surgeon: [name], MD;  Location: [facility] ENDOSCOPY;  Service: Endoscopy;  Laterality: N/A;  . CORONARY ARTERY BYPASS GRAFT N/A 01/28/2016   Procedure: CORONARY ARTERY BYPASS GRAFTING (CABG) x 3 USING RIGHT LEG GREATER SAPHENOUS VEIN GRAFT;  Surgeon: Melrose Nakayama, MD;  Location: [facility];  Service: Open Heart Surgery;  Laterality: N/A;  . ENDOVEIN HARVEST OF GREATER SAPHENOUS VEIN Right 01/28/2016   Procedure: ENDOVEIN HARVEST OF GREATER SAPHENOUS VEIN;  Surgeon: Melrose Nakayama, MD;  Location: [facility];  Service: Open Heart Surgery;  Laterality: Right;  . ESOPHAGEAL BANDING  03/28/2019   Procedure: ESOPHAGEAL BANDING;  Surgeon: [name], MD;  Location: [facility] ENDOS[facility];  Service: Endoscopy;;  . ESOPHAGEAL BANDING  04/07/2019   Procedure: ESOPHAGEAL BANDING;  Surgeon: Juanita Craver, MD;  Location: [facility] ENDOSCOPY;  Service: Endoscopy;;  . ESOPHAGOGASTRODUODENOSCOPY N/A 04/07/2019   Procedure: ESOPHAGOGASTRODUODENOSCOPY (EGD);  Surgeon: Juanita Craver, MD;  Location: [facility] ENDOSCOPY;  Service: Endoscopy;  Laterality: N/A;  . ESOPHAGOGASTRODUODENOSCOPY (EGD) WITH PROPOFOL N/A 07/07/2016   Procedure: ESOPHAGOGASTRODUODENOSCOPY (EGD) WITH PROPOFOL;  Surgeon: [name], MD;  Location:  [facility] ENDOSCOPY;  Service: Endoscopy;  Laterality: N/A;  . ESOPHAGOGASTRODUODENOSCOPY (EGD) WITH PROPOFOL N/A 03/28/2019   Procedure: ESOPHAGOGASTRODUODENOSCOPY (EGD) WITH PROPOFOL;  Surgeon: [name], MD;  Location: [facility] ENDOS[facility];  Service: Endoscopy;  Laterality: N/A;  . HEMOSTASIS CLIP PLACEMENT  04/07/2019   Procedure: HEMOSTASIS CLIP PLACEMENT;  Surgeon: Juanita Craver, MD;  Location: [facility] ENDOSCOPY;  Service: Endoscopy;;  . IR ANGIOGRAM SELECTIVE EACH ADDITIONAL VESSEL  04/08/2019  . IR EMBO ART  VEN HEMORR LYMPH EXTRAV  INC GUIDE ROADMAPPING  04/08/2019  . IR PARACENTESIS  04/08/2019  . IR RADIOLOGIST EVAL & MGMT  06/13/2019  . IR TIPS  04/08/2019  . MAXIMUM ACCESS (MAS)POSTERIOR LUMBAR INTERBODY FUSION (PLIF) 1 LEVEL Left 06/10/2015   Procedure: FOR MAXIMUM ACCESS (MAS) POSTERIOR LUMBAR INTERBODY FUSION (PLIF) LUMBAR THREE-FOUR EXTRAFORAMINAL MICRODISCECTOMY LUMBAR FIVE-SACRAL ONE LEFT;  Surgeon: Eustace Moore, MD;  Location: [facility] NEURO ORS;  Service: Neurosurgery;  Laterality: Left;  . RADIOLOGY WITH ANESTHESIA N/A 04/08/2019   Procedure: RADIOLOGY WITH  ANESTHESIA;  Surgeon: Radiologist, Medication, MD;  Location: [facility];  Service: Radiology;  Laterality: N/A;  . SCLEROTHERAPY  04/07/2019   Procedure: SCLEROTHERAPY;  Surgeon: Juanita Craver, MD;  Location: [facility] ENDOSCOPY;  Service: Endoscopy;;  . TEE WITHOUT CARDIOVERSION N/A 01/28/2016   Procedure: TRANSESOPHAGEAL ECHOCARDIOGRAM (TEE);  Surgeon: Melrose Nakayama, MD;  Location: [place];  Service: Open Heart Surgery;  Laterality: N/A;  . TUBAL LIGATION  1982   Dr Connye Burkitt  . UPPER GASTROINTESTINAL ENDOSCOPY    . VAGINAL HYSTERECTOMY  1997   Dr Rande Lawman     OB History   No obstetric history on file.     Family History  Problem Relation Age of Onset  . Lung cancer Father   . Arthritis Sister   . Arthritis Brother   . Liver cancer Brother   . Heart disease Maternal Grandmother   . Heart disease Maternal Grandfather   . Heart disease  Paternal Grandmother   . Heart disease Paternal Grandfather   . Breast cancer Mother   . Other Daughter        house fire  . Breast cancer Maternal Aunt   . Breast cancer Paternal Aunt   . Colon cancer Neg Hx   . Esophageal cancer Neg Hx   . Rectal cancer Neg Hx   . Stomach cancer Neg Hx     Social History   Tobacco Use  . Smoking status: Never Smoker  . Smokeless tobacco: Never Used  Substance Use Topics  . Alcohol use: No  . Drug use: No    Home Medications Prior to Admission medications   Medication Sig Start Date End Date Taking? Authorizing Provider  acetaminophen (TYLENOL) 500 MG tablet Take 500 mg by mouth at bedtime.     [provider]  BD PEN NEEDLE NANO U/F 32G X 4 MM MISC USE THREE TIMES DAILY AS DIRECTED 11/26/18   Reed, Tiffany L, DO  Biotin 10000 MCG TABS Take 10,000 mcg by mouth every morning.    [provider]  Cyanocobalamin (VITAMIN B 12 PO) Take 1,000 mcg by mouth daily.      [provider]  ezetimibe (ZETIA) 10 MG tablet Take one tablet by mouth once daily 07/12/19   Reed, Tiffany L, DO  furosemide (LASIX) 20 MG tablet Take 1 tablet (20 mg total) by mouth daily. Hold if B/p < 110/60 06/12/19   Ngetich, Dinah C, NP  glucose blood test strip One Touch Ultra II strips. Use to test blood sugar three times daily. Dx: E11.65 06/01/17   Reed, Tiffany L, DO  insulin aspart (NOVOLOG FLEXPEN) 100 UNIT/ML FlexPen 12 units at breakfast, 8 units at lunch and 12 at dinner    [provider]  insulin detemir (LEVEMIR FLEXTOUCH) 100 UNIT/ML FlexPen Inject 30 Units into the skin daily.    [provider]  Insulin Syringe-Needle U-100 (INSULIN SYRINGE 1CC/31GX5/16") 31G X 5/16" 1 ML MISC USE AS DIRECTED DAILY WITH LEVEMIR. Dx: E11.65 07/29/19   Reed, Tiffany L, DO  JARDIANCE 25 MG TABS tablet TAKE 1 TABLET BY MOUTH DAILY 06/28/19   Reed, Tiffany L, DO  loratadine (CLARITIN) 10 MG tablet Take 10 mg by mouth daily as needed for allergies.     [provider]  MAGNESIUM PO Take 500 mg by mouth daily.     [provider]  Multiple Vitamins-Minerals (MULTIVITAMIN WITH MINERALS) tablet Take 1 tablet by mouth daily.      [provider]  ondansetron [facility])  4 MG tablet Take 1 tablet (4 mg total) by mouth every 6 (six) hours as needed for nausea. 05/18/19   Desiree Hane, MD  pantoprazole (PROTONIX) 40 MG tablet Take 40 mg by mouth 2 (two) times daily.    [provider]  Polyethyl Glycol-Propyl Glycol (SYSTANE OP) Place 1 drop into both eyes daily.     [provider]  Probiotic Product (PROBIOTIC DAILY PO) Take 1 capsule by mouth daily. Digestive Advantage Probiotic    [provider]  rifaximin (XIFAXAN) 550 MG TABS tablet Take 1 tablet (550 mg total) by mouth 2 (two) times daily. 06/05/19   [name], MD    Allergies    Kiwi extract, Tdap [tetanus-diphth-acell pertussis], Statins, Latex, and Tramadol  Review of Systems   Review of Systems  Constitutional: Positive for fatigue. Negative for chills and fever.  HENT: Negative for congestion and rhinorrhea.   Eyes: Negative for redness and visual disturbance.  Respiratory: Positive for shortness of breath. Negative for wheezing.   Cardiovascular: Negative for chest pain and palpitations.  Gastrointestinal: Negative for nausea and vomiting.  Genitourinary: Negative for dysuria and urgency.  Musculoskeletal: Positive for neck pain. Negative for arthralgias and myalgias.  Skin: Negative for pallor and wound.  Neurological: Negative for dizziness and headaches.    Physical Exam Updated Vital Signs BP (!) 144/58   Pulse 81   Temp 98.2 F (36.8 C) (Oral)   Resp (!) 26   SpO2 97%   Physical Exam Vitals and nursing note reviewed.  Constitutional:      General: She is not in acute distress.    Appearance: She is well-developed. She is not diaphoretic.  HENT:     Head: Normocephalic and atraumatic.  Eyes:      Pupils: Pupils are equal, round, and reactive to light.  Cardiovascular:     Rate and Rhythm: Normal rate and regular rhythm.     Heart sounds: No murmur. No friction rub. No gallop.   Pulmonary:     Effort: Pulmonary effort is normal.     Breath sounds: No wheezing or rales.  Abdominal:     General: There is no distension.     Palpations: Abdomen is soft.     Tenderness: There is no abdominal tenderness.  Musculoskeletal:        General: No tenderness.     Cervical back: Normal range of motion and neck supple.     Right lower leg: Edema present.     Left lower leg: Edema present.     Comments: 2+ edema to the midshin  Skin:    General: Skin is warm and dry.  Neurological:     Mental Status: She is alert and oriented to person, place, and time.  Psychiatric:        Behavior: Behavior normal.     ED Results / Procedures / Treatments   Labs (all labs ordered are listed, but only abnormal results are displayed) Labs Reviewed  BASIC METABOLIC PANEL - Abnormal; Notable for the following components:      Result Value   Potassium 3.3 (*)    Glucose, Bld 164 (*)    GFR calc non Af Amer 59 (*)    All other components within normal limits  CBC - Abnormal; Notable for the following components:   Platelets 102 (*)    All other components within normal limits  TROPONIN I (HIGH SENSITIVITY) - Abnormal; Notable for the following components:   Troponin  I (High Sensitivity) 24 (*)    All other components within normal limits  GROUP A STREP BY PCR  SARS CORONAVIRUS 2 (TAT 6-24 HRS)  PROTIME-INR  HEPATIC FUNCTION PANEL  BRAIN NATRIURETIC PEPTIDE  TSH  T4, FREE  HEPARIN LEVEL (UNFRACTIONATED)  POC SARS CORONAVIRUS 2 AG -  ED  TROPONIN I (HIGH SENSITIVITY)    EKG EKG Interpretation  Date/Time:  Monday August 19 2019 14:51:27 EDT Ventricular Rate:  81 PR Interval:    QRS Duration: 108 QT Interval:  404 QTC Calculation: 469 R Axis:   56 Text Interpretation: Sinus rhythm  changed from afib Otherwise no significant change Confirmed by Deno Etienne [phone]) on 08/19/2019 3:03:07 PM   Radiology DG Chest 2 View  Result Date: 08/19/2019 CLINICAL DATA:  Shortness of breath. EXAM: CHEST - 2 VIEW COMPARISON:  May 26, 2019. FINDINGS: The heart size and mediastinal contours are within normal limits. Both lungs are clear. No pneumothorax or pleural effusion is noted. Status post coronary bypass graft. The visualized skeletal structures are unremarkable. IMPRESSION: No active cardiopulmonary disease. Electronically Signed   By: Marijo Conception M.D.   On: 08/19/2019 11:49    Procedures Procedures (including critical care time)  Medications Ordered in ED Medications  heparin ADULT infusion 100 units/mL (25000 units/222m sodium chloride 0.45%) (1,000 Units/hr Intravenous New Bag/Given 08/19/19 1555)  sodium chloride flush (NS) 0.9 % injection 3 mL (3 mLs Intravenous Given 08/19/19 1456)  furosemide (LASIX) injection 20 mg (20 mg Intravenous Given 08/19/19 1510)  heparin bolus via infusion 3,250 Units (3,250 Units Intravenous Bolus from Bag 08/19/19 1556)    ED Course  I have reviewed the triage vital signs and the nursing notes.  Pertinent labs & imaging results that were available during my care of the patient were reviewed by me and considered in my medical decision making (see chart for details).    MDM Rules/Calculators/A&P                      74yo F with a chief complaints of fatigue and chest pain that radiates to the right side of the back.  Patient was seen in urgent care earlier today and found to have atrial fibrillation with RVR.  Spontaneously converted upon my exam.  Heart rate in the 80s.  Normal sinus.  Throwing frequent PVCs.  Unfortunately the patient is still significantly symptomatic.  Seems less likely that the A. fib was causing her to have these symptoms.  Chest pain is somewhat typical, EKG without any concerning findings after resolution of A.  fib.  Troponin is very mildly elevated at 24.  We will have cardiology evaluate.  Signed out to Dr. WEulis Foster please see his note for further details of care in the ED.  The patients results and plan were reviewed and discussed.   Any x-rays performed were independently reviewed by myself.   Differential diagnosis were considered with the presenting HPI.  Medications  heparin ADULT infusion 100 units/mL (25000 units/2516msodium chloride 0.45%) (1,000 Units/hr Intravenous New Bag/Given 08/19/19 1555)  sodium chloride flush (NS) 0.9 % injection 3 mL (3 mLs Intravenous Given 08/19/19 1456)  furosemide (LASIX) injection 20 mg (20 mg Intravenous Given 08/19/19 1510)  heparin bolus via infusion 3,250 Units (3,250 Units Intravenous Bolus from Bag 08/19/19 1556)    Vitals:   08/19/19 1034 08/19/19 1515 08/19/19 1530  BP: 107/60 (!) 139/57 (!) 144/58  Pulse: (!) 45 80 81  Resp: 14 (!)  26 (!) 26  Temp: 98.2 F (36.8 C)    TempSrc: Oral    SpO2: 99% 98% 97%    Final diagnoses:  Atrial fibrillation with RVR (HCC)  Chest pain with moderate risk for cardiac etiology    Admission/ observation were discussed with the admitting physician, patient and/or family and they are comfortable with the plan.   Final Clinical Impression(s) / ED Diagnoses Final diagnoses:  Atrial fibrillation with RVR ([place])  Chest pain with moderate risk for cardiac etiology    Rx / DC Orders ED Discharge Orders    None       Deno Etienne, DO 08/19/19 1626

## 2019-08-20 ENCOUNTER — Observation Stay (HOSPITAL_COMMUNITY): Payer: Medicare Other

## 2019-08-20 ENCOUNTER — Inpatient Hospital Stay (HOSPITAL_COMMUNITY): Payer: Medicare Other

## 2019-08-20 DIAGNOSIS — Z79899 Other long term (current) drug therapy: Secondary | ICD-10-CM | POA: Diagnosis not present

## 2019-08-20 DIAGNOSIS — I340 Nonrheumatic mitral (valve) insufficiency: Secondary | ICD-10-CM

## 2019-08-20 DIAGNOSIS — R0781 Pleurodynia: Secondary | ICD-10-CM

## 2019-08-20 DIAGNOSIS — Z794 Long term (current) use of insulin: Secondary | ICD-10-CM | POA: Diagnosis not present

## 2019-08-20 DIAGNOSIS — R0602 Shortness of breath: Secondary | ICD-10-CM | POA: Diagnosis not present

## 2019-08-20 DIAGNOSIS — I5033 Acute on chronic diastolic (congestive) heart failure: Secondary | ICD-10-CM | POA: Diagnosis present

## 2019-08-20 DIAGNOSIS — Z86010 Personal history of colonic polyps: Secondary | ICD-10-CM | POA: Diagnosis not present

## 2019-08-20 DIAGNOSIS — Z951 Presence of aortocoronary bypass graft: Secondary | ICD-10-CM | POA: Diagnosis not present

## 2019-08-20 DIAGNOSIS — Z85828 Personal history of other malignant neoplasm of skin: Secondary | ICD-10-CM | POA: Diagnosis not present

## 2019-08-20 DIAGNOSIS — K7469 Other cirrhosis of liver: Secondary | ICD-10-CM | POA: Diagnosis not present

## 2019-08-20 DIAGNOSIS — M810 Age-related osteoporosis without current pathological fracture: Secondary | ICD-10-CM | POA: Diagnosis present

## 2019-08-20 DIAGNOSIS — E1165 Type 2 diabetes mellitus with hyperglycemia: Secondary | ICD-10-CM | POA: Diagnosis present

## 2019-08-20 DIAGNOSIS — I361 Nonrheumatic tricuspid (valve) insufficiency: Secondary | ICD-10-CM

## 2019-08-20 DIAGNOSIS — M1990 Unspecified osteoarthritis, unspecified site: Secondary | ICD-10-CM | POA: Diagnosis present

## 2019-08-20 DIAGNOSIS — I8510 Secondary esophageal varices without bleeding: Secondary | ICD-10-CM | POA: Diagnosis present

## 2019-08-20 DIAGNOSIS — Z20822 Contact with and (suspected) exposure to covid-19: Secondary | ICD-10-CM | POA: Diagnosis present

## 2019-08-20 DIAGNOSIS — K219 Gastro-esophageal reflux disease without esophagitis: Secondary | ICD-10-CM | POA: Diagnosis present

## 2019-08-20 DIAGNOSIS — I4891 Unspecified atrial fibrillation: Secondary | ICD-10-CM | POA: Diagnosis present

## 2019-08-20 DIAGNOSIS — Z8261 Family history of arthritis: Secondary | ICD-10-CM | POA: Diagnosis not present

## 2019-08-20 DIAGNOSIS — N181 Chronic kidney disease, stage 1: Secondary | ICD-10-CM | POA: Diagnosis present

## 2019-08-20 DIAGNOSIS — E785 Hyperlipidemia, unspecified: Secondary | ICD-10-CM | POA: Diagnosis present

## 2019-08-20 DIAGNOSIS — I2510 Atherosclerotic heart disease of native coronary artery without angina pectoris: Secondary | ICD-10-CM | POA: Diagnosis present

## 2019-08-20 DIAGNOSIS — M542 Cervicalgia: Secondary | ICD-10-CM | POA: Diagnosis present

## 2019-08-20 DIAGNOSIS — K7460 Unspecified cirrhosis of liver: Secondary | ICD-10-CM | POA: Diagnosis present

## 2019-08-20 DIAGNOSIS — E876 Hypokalemia: Secondary | ICD-10-CM | POA: Diagnosis present

## 2019-08-20 DIAGNOSIS — Z981 Arthrodesis status: Secondary | ICD-10-CM | POA: Diagnosis not present

## 2019-08-20 DIAGNOSIS — I300 Acute nonspecific idiopathic pericarditis: Secondary | ICD-10-CM | POA: Diagnosis not present

## 2019-08-20 DIAGNOSIS — I248 Other forms of acute ischemic heart disease: Secondary | ICD-10-CM | POA: Diagnosis present

## 2019-08-20 DIAGNOSIS — R079 Chest pain, unspecified: Secondary | ICD-10-CM | POA: Diagnosis not present

## 2019-08-20 DIAGNOSIS — I130 Hypertensive heart and chronic kidney disease with heart failure and stage 1 through stage 4 chronic kidney disease, or unspecified chronic kidney disease: Secondary | ICD-10-CM | POA: Diagnosis present

## 2019-08-20 LAB — BASIC METABOLIC PANEL
Anion gap: 9 (ref 5–15)
BUN: 19 mg/dL (ref 8–23)
CO2: 24 mmol/L (ref 22–32)
Calcium: 9.1 mg/dL (ref 8.9–10.3)
Chloride: 106 mmol/L (ref 98–111)
Creatinine, Ser: 1.02 mg/dL — ABNORMAL HIGH (ref 0.44–1.00)
GFR calc Af Amer: >60 mL/min (ref >60)
GFR calc non Af Amer: 55 mL/min — ABNORMAL LOW (ref >60)
Glucose, Bld: 148 mg/dL — ABNORMAL HIGH (ref 70–99)
Potassium: 3.9 mmol/L (ref 3.5–5.1)
Sodium: 139 mmol/L (ref 135–145)

## 2019-08-20 LAB — ECHOCARDIOGRAM COMPLETE
Height: 63 in
Weight: 2168 oz

## 2019-08-20 LAB — LIPID PANEL
Cholesterol: 128 mg/dL (ref 0–200)
HDL: 58 mg/dL (ref >40)
LDL Cholesterol: 61 mg/dL (ref 0–99)
Total CHOL/HDL Ratio: 2.2 RATIO
Triglycerides: 44 mg/dL (ref <150)
VLDL: 9 mg/dL (ref 0–40)

## 2019-08-20 LAB — CBC
HCT: 37.7 % (ref 36.0–46.0)
Hemoglobin: 12.4 g/dL (ref 12.0–15.0)
MCH: 31.1 pg (ref 26.0–34.0)
MCHC: 32.9 g/dL (ref 30.0–36.0)
MCV: 94.5 fL (ref 80.0–100.0)
Platelets: 117 10*3/uL — ABNORMAL LOW (ref 150–400)
RBC: 3.99 MIL/uL (ref 3.87–5.11)
RDW: 15 % (ref 11.5–15.5)
WBC: 8 10*3/uL (ref 4.0–10.5)
nRBC: 0 % (ref 0.0–0.2)

## 2019-08-20 LAB — GLUCOSE, CAPILLARY
Glucose-Capillary: 152 mg/dL — ABNORMAL HIGH (ref 70–99)
Glucose-Capillary: 111 mg/dL — ABNORMAL HIGH (ref 70–99)
Glucose-Capillary: 111 mg/dL — ABNORMAL HIGH (ref 70–99)
Glucose-Capillary: 177 mg/dL — ABNORMAL HIGH (ref 70–99)
Glucose-Capillary: 206 mg/dL — ABNORMAL HIGH (ref 70–99)

## 2019-08-20 LAB — HEPARIN LEVEL (UNFRACTIONATED)
Heparin Unfractionated: 0.27 IU/mL — ABNORMAL LOW (ref 0.30–0.70)
Heparin Unfractionated: 0.32 IU/mL (ref 0.30–0.70)

## 2019-08-20 LAB — C-REACTIVE PROTEIN: CRP: 1.1 mg/dL — ABNORMAL HIGH (ref <1.0)

## 2019-08-20 LAB — SEDIMENTATION RATE: Sed Rate: 42 mm/hr — ABNORMAL HIGH (ref 0–22)

## 2019-08-20 MED ORDER — INSULIN ASPART 100 UNIT/ML ~~LOC~~ SOLN
0.0000 [IU] | Freq: Every day | SUBCUTANEOUS | Status: DC
  Administered 2019-08-20: 2 [IU] via SUBCUTANEOUS

## 2019-08-20 MED ORDER — PANTOPRAZOLE SODIUM 40 MG PO TBEC
40.0000 mg | DELAYED_RELEASE_TABLET | Freq: Two times a day (BID) | ORAL | Status: DC
  Administered 2019-08-20 – 2019-08-21 (×4): 40 mg via ORAL
  Filled 2019-08-20 (×4): qty 1

## 2019-08-20 MED ORDER — EZETIMIBE 10 MG PO TABS
10.0000 mg | ORAL_TABLET | Freq: Every day | ORAL | Status: DC
  Administered 2019-08-20 – 2019-08-21 (×2): 10 mg via ORAL
  Filled 2019-08-20 (×2): qty 1

## 2019-08-20 MED ORDER — RIFAXIMIN 550 MG PO TABS
550.0000 mg | ORAL_TABLET | Freq: Two times a day (BID) | ORAL | Status: DC
  Administered 2019-08-20 – 2019-08-21 (×4): 550 mg via ORAL
  Filled 2019-08-20 (×5): qty 1

## 2019-08-20 MED ORDER — MAGNESIUM OXIDE 400 (241.3 MG) MG PO TABS
400.0000 mg | ORAL_TABLET | Freq: Every day | ORAL | Status: DC
  Administered 2019-08-20 – 2019-08-21 (×2): 400 mg via ORAL
  Filled 2019-08-20 (×2): qty 1

## 2019-08-20 MED ORDER — IOHEXOL 350 MG/ML SOLN
100.0000 mL | Freq: Once | INTRAVENOUS | Status: AC | PRN
  Administered 2019-08-20: 100 mL via INTRAVENOUS

## 2019-08-20 MED ORDER — INSULIN ASPART 100 UNIT/ML ~~LOC~~ SOLN
0.0000 [IU] | Freq: Three times a day (TID) | SUBCUTANEOUS | Status: DC
  Administered 2019-08-20: 3 [IU] via SUBCUTANEOUS
  Administered 2019-08-21: 15 [IU] via SUBCUTANEOUS
  Administered 2019-08-21: 2 [IU] via SUBCUTANEOUS

## 2019-08-20 NOTE — Progress Notes (Signed)
[place] for Heparin Indication: atrial fibrillation  Allergies  Allergen Reactions  . Kiwi Extract Anaphylaxis  . Tdap [Tetanus-Diphth-Acell Pertussis] Swelling and Other (See Comments)    Swelling at injection site, gets very hot  . Statins Other (See Comments)    RHABDOMYOLYSIS  . Latex Itching, Dermatitis and Rash  . Tramadol Nausea And Vomiting    Patient Measurements: Heparin Dosing Weight: 65 kg  Vital Signs: BP: 108/52 (04/12 2300) Pulse Rate: 69 (04/12 2300)  Labs: Recent Labs    08/19/19 1127 08/19/19 1529 08/20/19 0020  HGB 13.0  --   --   HCT 40.7  --   --   PLT 102*  --   --   LABPROT  --  14.4  --   INR  --  1.1  --   HEPARINUNFRC  --   --  0.32  CREATININE 0.96  --   --   TROPONINIHS 24* 27*  --     CrCl cannot be calculated (Unknown ideal weight.).   Medical History: Past Medical History:  Diagnosis Date  . Allergy   . Arthritis    neck  . Cataract    bilateral - MD monitoring cataracts  . CHF (congestive heart failure) ([place])   . Chronic kidney disease, stage I    DR OTTELIN  HX UTIS  . Cirrhosis ([place])   . Cramp of limb   . Diabetes mellitus   . Dysphagia, unspecified(787.20)   . Dysuria   . Epistaxis   . GERD (gastroesophageal reflux disease)   . Heart murmur    NO CARDIOLOGIST  DX FOR YEARS ASYMPTOMATIC  . Hepatic encephalopathy ([place])   . Lumbago   . Neoplasm of uncertain behavior of skin   . Nonspecific elevation of levels of transaminase or lactic acid dehydrogenase (LDH)   . Osteoarthrosis, unspecified whether generalized or localized, unspecified site   . Other and unspecified hyperlipidemia    diet controlled  . Pain in joint, shoulder region   . Paresthesias 04/01/2015  . Postablative ovarian failure   . Trochanteric bursitis of left hip 12/15/2015  . Type 2 diabetes mellitus without complication ([place])   . Unspecified essential hypertension    no meds    Assessment: 74 yo F  presents with new onset atrial fibrillation. CHADSVASc score 7. No AC noted PTA. Pharmacy asked to dose heparin. Hgb wnl, plts 102. No overt bleeding noted.  4/13 AM update:  Initial heparin level therapeutic   Goal of Therapy:  Heparin level 0.3-0.7 units/ml Monitor platelets by anticoagulation protocol: Yes   Plan:  Cont heparin infusion at 1000 units/hr Confirmatory heparin level with AM labs  Narda Bonds, PharmD, [place] Pharmacist Phone: [phone]

## 2019-08-20 NOTE — Progress Notes (Signed)
Echocardiogram 2D Echocardiogram has been performed.  [place] Deputy Matilda Fleig 08/20/2019, 10:32 AM

## 2019-08-20 NOTE — Progress Notes (Signed)
Progress Note  Patient Name: [name] Date of Encounter: 08/20/2019  Primary Cardiologist: Dr. [name], MD   Subjective   Continues to have chest discomfort with deep inspiration otherwise stable without SOB or current chest pain. Bilateral crackles on exam   Inpatient Medications    Scheduled Meds: . ezetimibe  10 mg Oral Daily  . furosemide  20 mg Intravenous BID  . insulin aspart  0-15 Units Subcutaneous TID WC  . insulin aspart  0-5 Units Subcutaneous QHS  . magnesium oxide  400 mg Oral Daily  . metoprolol tartrate  12.5 mg Oral BID  . pantoprazole  40 mg Oral BID  . rifaximin  550 mg Oral BID   Continuous Infusions: . heparin 1,000 Units/hr (08/19/19 1555)   PRN Meds: acetaminophen, ALPRAZolam, ondansetron (ZOFRAN) IV, zolpidem   Vital Signs    Vitals:   08/19/19 2230 08/19/19 2300 08/20/19 0200 08/20/19 0500  BP: 103/71 (!) 108/52 (!) 105/57 (!) 99/51  Pulse: 69 69 72 68  Resp: (!) 28 (!) 23 19 (!) 22  Temp:   97.6 F (36.4 C) 97.9 F (36.6 C)  TempSrc:   Oral Oral  SpO2: 96% 94% 97% 95%  Weight:   61.5 kg   Height:   5' 3"  (1.6 m)     Intake/Output Summary (Last 24 hours) at 08/20/2019 0746 Last data filed at 08/20/2019 0400 Gross per 24 hour  Intake 360.83 ml  Output --  Net 360.83 ml   Filed Weights   08/20/19 0200  Weight: 61.5 kg    Physical Exam   General: Well developed, well nourished, NAD Neck: Negative for carotid bruits. No JVD Lungs: Bilateral lower lobe crackles. Breathing is unlabored. Cardiovascular: RRR with S1 S2. + murmur Abdomen: Soft, non-tender, non-distended. No obvious abdominal masses. Extremities: Mild 1+ edema. Radial pulses 2+ bilaterally Neuro: Alert and oriented. No focal deficits. No facial asymmetry. MAE spontaneously. Psych: Responds to questions appropriately with normal affect.    Labs    Chemistry Recent Labs  Lab 08/19/19 1127 08/19/19 1529 08/20/19 0307  NA 140  --  139  K 3.3*   --  3.9  CL 104  --  106  CO2 25  --  24  GLUCOSE 164*  --  148*  BUN 19  --  19  CREATININE 0.96  --  1.02*  CALCIUM 9.5  --  9.1  PROT  --  7.4  --   ALBUMIN  --  3.0*  --   AST  --  63*  --   ALT  --  41  --   ALKPHOS  --  144*  --   BILITOT  --  2.2*  --   GFRNONAA 59*  --  55*  GFRAA >60  --  >60  ANIONGAP 11  --  9     Hematology Recent Labs  Lab 08/19/19 1127 08/20/19 0307  WBC 6.4 8.0  RBC 4.26 3.99  HGB 13.0 12.4  HCT 40.7 37.7  MCV 95.5 94.5  MCH 30.5 31.1  MCHC 31.9 32.9  RDW 14.7 15.0  PLT 102* 117*    Cardiac EnzymesNo results for input(s): TROPONINI in the last 168 hours. No results for input(s): TROPIPOC in the last 168 hours.   BNP Recent Labs  Lab 08/19/19 1530  BNP 264.9*     DDimer No results for input(s): DDIMER in the last 168 hours.   Radiology    DG Chest 2  View  Result Date: 08/19/2019 CLINICAL DATA:  Shortness of breath. EXAM: CHEST - 2 VIEW COMPARISON:  May 26, 2019. FINDINGS: The heart size and mediastinal contours are within normal limits. Both lungs are clear. No pneumothorax or pleural effusion is noted. Status post coronary bypass graft. The visualized skeletal structures are unremarkable. IMPRESSION: No active cardiopulmonary disease. Electronically Signed   By: Marijo Conception M.D.   On: 08/19/2019 11:49   Telemetry    08/20/19 NSR with PVCs- Personally Reviewed  ECG    No newt tracing as of 08/20/19- Personally Reviewed  Cardiac Studies   Echo 02/2016 Study Conclusions   - Left ventricle: The cavity size was normal. There was mild  concentric hypertrophy. Systolic function was normal. The  estimated ejection fraction was in the range of 60% to 65%. Wall  motion was normal; there were no regional wall motion  abnormalities. There was an increased relative contribution of  atrial contraction to ventricular filling. Doppler parameters are  consistent with abnormal left ventricular relaxation (grade 1    diastolic dysfunction).  - Aortic valve: Trileaflet; normal thickness leaflets. Although the  mean AVG and peak velocity are consistent with very mild  stenosis, the aortic valve opens normally. There was mild  regurgitation. Peak velocity (S): 247 cm/s. Mean gradient (S): 12  mm Hg. Valve area (VTI): 1.24 cm^2. Valve area (Vmax): 1.18 cm^2.  Valve area (Vmean): 1.12 cm^2. Regurgitation pressure half-time:  508 ms.  - Mitral valve: Calcified annulus. There was mild regurgitation.  - Left atrium: The atrium was mildly to moderately dilated.  - Pulmonary arteries: PA peak pressure: 38 mm Hg (S).  - Pericardium, extracardiac: There was a left pleural effusion.   Cardiac cath 01/2016  Prox RCA lesion, 75 %stenosed.  Ramus lesion, 95 %stenosed.  Prox LAD to Mid LAD lesion, 90 %stenosed.  Mid RCA to Dist RCA lesion, 100 %stenosed.  Dist RCA lesion, 100 %stenosed.  The left ventricular ejection fraction is 45-50% by visual estimate.  The left ventricular systolic function is normal.  LV end diastolic pressure is mildly elevated.   Acute coronary syndrome with recent total occlusion of the dominant RCA, high-grade obstruction in the proximal LAD which Collateralizes the RCA, and high-grade obstruction and a moderate size ramus intermedius branch.  Left ventricular systolic dysfunction with inferobasal moderate hypokinesis and EF of 45-50%. Upper normal LV EDP.   RECOMMENDATIONS:  Long discussion with the patient concerning treatment options which include multivessel PCI / stenting vs coronary bypass grafting with LIMA to the LAD. If PCI, RCA will require near full metal jacket. After discussion, the patient would like the approach that will give her the most durable long-term success.  IV nitroglycerin, IV fluid, high intensity statin therapy, aspirin therapy, and beta blocker therapy as tolerated.  Expedited CABG because of presentation and critical nature of  disease.  Patient Profile     74 y.o. female with pmh of HTN, HLD, DM2, CAD s/p CABG in 2017 EF 60-65% 02/2016, ascites/cirrhosis, B/L carotid artery disease who is being seen for chest pain and afib RVR.   Assessment & Plan    1. New onset atrial fibrillation -Presented with a 3  Day hx of day atypical/pleuritic chest pain with associated SOB>>went to urgent care with EKG showing AF with RVR. Patient spontaneously converted to sinus in the ED.  Potassium 3.3. BNP 264 HS trop 24>27. Platelets 102. CXR unremarkable.  -TSH found to be 3.7 -Mg+ was 2.1 -CHADSVASC = 6 (  female, age, HTN, CAD, DM2, CHF) therefore will need long-term anticoagulation -Will need to be cautious of long term AC given hx of portal vein thrombosis given large varices and prior bleeding from band ulcer site. She is now s/p TIPS. Need to discuss risk of variceal bleeding long term vs. risk of stroke -Echo ordered however not yet completed -Started on Lopressor 12.5 mg BID>>>previously on nadolol for varices  -BP soft but at baseline   2. Chest pain/CAD s/p CABGx 3: -Chest pain felt to be in the setting of the above however continues to have chest pain with inspiration>>>needs CTA? Will discuss with MD  -Hs troponin mildly elevated 24>27. Trend flat>>not consistent with ACS  -Continue BB, zetia -Do not anticipate further ischemic workup at this time  3. Acute on chronic Chronic Diastolic HF: -BNP mildly up at 264. Patient also has h/o of cirrhosis with ascitics and esophageal varices. She has been having lower leg edema over the last 2 months worse in the last couple days -EF preserved in 2017>>>awaiting echo this admission  -Continue IV Lasix 9m BID given bilateral lower lobe crackles on exam. Anticipate changing to PO tomorrow  -Creatinine remains at baseline -Weight, 135lb today  -I&O, net positive 3648m 4. HLD: -Last LDL, 61 this admission  -PTA Zetia -No statin 2/2 to cirrhosis  5. HTN: -Low,  99/51>105/57>108/52 -Continue IV Lasix 2033mID  -Continue Lopressor as above  6. Heart murmur: -Echo in 2017 with mild AS and mild AI, mean gradient 12 mmHgecho as above -Pending echo this admission   7. DM2: - SSI while inpatient   8. Hypokalemia -Stabilized today at 3.9 -Goal >4   Signed, JilKathyrn Drown-[facility] Pager: [phone]/2021, 7:46 AM     For questions or updates, please contact   Please consult [email or web address] for contact info under Cardiology/STEMI.

## 2019-08-20 NOTE — Progress Notes (Signed)
[facility] for Heparin Indication: atrial fibrillation  Allergies  Allergen Reactions  . Kiwi Extract Anaphylaxis  . Tdap [Tetanus-Diphth-Acell Pertussis] Swelling and Other (See Comments)    Swelling at injection site, gets very hot  . Statins Other (See Comments)    RHABDOMYOLYSIS  . Latex Itching, Dermatitis and Rash  . Tramadol Nausea And Vomiting    Patient Measurements: Heparin Dosing Weight: 65 kg  Vital Signs: Temp: 97.9 F (36.6 C) (04/13 0500) Temp Source: Oral (04/13 0500) BP: 93/44 (04/13 0700) Pulse Rate: 66 (04/13 0700)  Labs: Recent Labs    08/19/19 1127 08/19/19 1529 08/20/19 0020 08/20/19 0307  HGB 13.0  --   --  12.4  HCT 40.7  --   --  37.7  PLT 102*  --   --  117*  LABPROT  --  14.4  --   --   INR  --  1.1  --   --   HEPARINUNFRC  --   --  0.32 0.27*  CREATININE 0.96  --   --  1.02*  TROPONINIHS 24* 27*  --   --     Estimated Creatinine Clearance: 41.2 mL/min (A) (by C-G formula based on SCr of 1.02 mg/dL (H)).   Medical History: Past Medical History:  Diagnosis Date  . Allergy   . Arthritis    neck  . Cataract    bilateral - MD monitoring cataracts  . CHF (congestive heart failure) ([place])   . Chronic kidney disease, stage I    DR OTTELIN  HX UTIS  . Cirrhosis ([place])   . Cramp of limb   . Diabetes mellitus   . Dysphagia, unspecified(787.20)   . Dysuria   . Epistaxis   . GERD (gastroesophageal reflux disease)   . Heart murmur    NO CARDIOLOGIST  DX FOR YEARS ASYMPTOMATIC  . Hepatic encephalopathy ([place])   . Lumbago   . Neoplasm of uncertain behavior of skin   . Nonspecific elevation of levels of transaminase or lactic acid dehydrogenase (LDH)   . Osteoarthrosis, unspecified whether generalized or localized, unspecified site   . Other and unspecified hyperlipidemia    diet controlled  . Pain in joint, shoulder region   . Paresthesias 04/01/2015  . Postablative ovarian failure   .  Trochanteric bursitis of left hip 12/15/2015  . Type 2 diabetes mellitus without complication ([place])   . Unspecified essential hypertension    no meds    Assessment: 74 yo F presents with new onset atrial fibrillation. CHADSVASc score 7. No AC noted PTA. Pharmacy asked to dose heparin. Pt noted with hx of esophageal varices.   - Hg = 12.4. PLT = 117 (hx of thrombocytopenia).  - heparin level = 0.27   Goal of Therapy:  Heparin level 0.3-0.7 units/ml Monitor platelets by anticoagulation protocol: Yes   Plan:  Increase heparin infusion to 1100 units/hr Daily heparin level and CBC   Clydell Hakim  PharmD candidate

## 2019-08-21 ENCOUNTER — Other Ambulatory Visit: Payer: Self-pay | Admitting: *Deleted

## 2019-08-21 DIAGNOSIS — I300 Acute nonspecific idiopathic pericarditis: Secondary | ICD-10-CM | POA: Diagnosis not present

## 2019-08-21 DIAGNOSIS — I4891 Unspecified atrial fibrillation: Secondary | ICD-10-CM | POA: Diagnosis not present

## 2019-08-21 LAB — CBC
HCT: 37.8 % (ref 36.0–46.0)
Hemoglobin: 12.2 g/dL (ref 12.0–15.0)
MCH: 30.6 pg (ref 26.0–34.0)
MCHC: 32.3 g/dL (ref 30.0–36.0)
MCV: 94.7 fL (ref 80.0–100.0)
Platelets: 110 10*3/uL — ABNORMAL LOW (ref 150–400)
RBC: 3.99 MIL/uL (ref 3.87–5.11)
RDW: 14.9 % (ref 11.5–15.5)
WBC: 4 10*3/uL (ref 4.0–10.5)
nRBC: 0 % (ref 0.0–0.2)

## 2019-08-21 LAB — HEMOGLOBIN A1C
Hgb A1c MFr Bld: 6.2 % — ABNORMAL HIGH (ref 4.8–5.6)
Mean Plasma Glucose: 131 mg/dL

## 2019-08-21 LAB — GLUCOSE, CAPILLARY
Glucose-Capillary: 145 mg/dL — ABNORMAL HIGH (ref 70–99)
Glucose-Capillary: 358 mg/dL — ABNORMAL HIGH (ref 70–99)

## 2019-08-21 MED ORDER — COLCHICINE 0.6 MG PO TABS: 0.6000 mg | ORAL_TABLET | Freq: Two times a day (BID) | ORAL | Status: DC

## 2019-08-21 MED ORDER — METOPROLOL TARTRATE 25 MG PO TABS: 25.0000 mg | ORAL_TABLET | ORAL | 2 refills | Status: DC | PRN

## 2019-08-21 MED ORDER — COLCHICINE 0.6 MG PO TABS: 0.6000 mg | ORAL_TABLET | Freq: Every day | ORAL | 0 refills | Status: DC

## 2019-08-21 MED ORDER — FUROSEMIDE 40 MG PO TABS
40.0000 mg | ORAL_TABLET | Freq: Every day | ORAL | Status: DC
  Administered 2019-08-21: 40 mg via ORAL
  Filled 2019-08-21: qty 1

## 2019-08-21 MED ORDER — COLCHICINE 0.6 MG PO TABS: 0.6000 mg | ORAL_TABLET | Freq: Every day | ORAL | Status: DC

## 2019-08-21 MED ORDER — FUROSEMIDE 40 MG PO TABS: 40.0000 mg | ORAL_TABLET | Freq: Every day | ORAL | 2 refills | Status: DC

## 2019-08-21 MED ORDER — METOPROLOL TARTRATE 25 MG PO TABS: 25.0000 mg | ORAL_TABLET | ORAL | Status: DC | PRN

## 2019-08-21 NOTE — Discharge Summary (Signed)
Discharge Summary    Patient ID: [name] MRN: [id number]; DOB: April 13, 1946  Admit date: 08/19/2019 Discharge date: 08/21/2019  Primary Care Provider: Gayland Curry, DO  Primary Cardiologist: [name], MD   Discharge Diagnoses    Principal Problem:   Atrial fibrillation with rapid ventricular response ([place]) Active Problems:   Hyperlipemia   DM (diabetes mellitus), type 2, uncontrolled ([place])   S/P CABG x 3   CAD (coronary artery disease)   Liver cirrhosis ([place])   Esophageal varices without bleeding ([place])   UGI bleed  Diagnostic Studies/Procedures    None    History of Present Illness     [name] is a 74 y.o. female with a hx of HTN, HLD, DM2, CAD s/p CABG in 2017 EF 60-65% 02/2016, ascites/cirrhosis, B/L carotid artery disease who was being seen for chest pain and afib RVR.   Hospital Course     [name] had a STEMI in 2017 followed by CABG, LIMA-LAD, SVG- IM, SVG -PDA. EF at the time 55-60%. Patient had post-op ascites and cirrhosis. She is s/p multiple paracentesis and followed by GI. BB changed to nadolol. She tales spironolactone and lasix. Last saw Dr. Johnsie Cancel 07/2018 and was stable from a cardiac standpoint.    The patient presented to the ED 08/19/19 for chest pain. Patient said symptoms started about 3 days prior to presentation while at rest. Chest pain was described as middle to right sided sharp in nature. It was worse with taking a deep breath. She had associated shortness of breath but no N/V or diaphoresis. Given her symptoms, she went to an urgent care and was found to have new onset A. fib and was sent to the ED for evaluation. She reported 2 months ago she had banding for her esophageal varices and since then has had intermittent swelling. She takes Lasix 20 mg daily. Spironolatone previously discontinued for worsening kidney function.   In the ED HS troponin 24>27. BNP 264. Initial EKG with Afib RVR, rates in the 120s. In the ED patient  spontaneous converted to sinus rhythm. Follow up EKG shows sinus rates in the 80s. CXR unremarkable.  Patient was given IV Lasix and started on IV heparin.   Given GI hx, AC for AF was discussed at length. Note from Dr. [name] (her GI MD) on 06/18/19 noted that she is not on long term anticoagulation for her portal vein thrombosis given large varices and prior bleeding from band ulcer site. She is now s/p TIPS.   Medical issues described below:  Pleuritic chest pain:  -hsT were found to be negative, flat and more consistent with demand ischemia (likely from afib RVR than ACS).  -echocardiogram with normal EF and G2DD -CTA performed which showed no evidence of PE -CRP and Sed rates were elevated -With mildly elevated inflammatory markers, Dr. Harrell Gave dicussed treatment for pericarditis. Typical rx would be high dose NSAIDs or aspirin, but with her GI history and risk of bleeding, this is likely high risk for her. She agrees with this. With shared decision making, we decided to pursue colchicine treatment without NSAID/aspirin.   New onset atrial fibrillation -Presented with a three day hx of day atypical/pleuritic chest pain with associatedSOB>>went to urgent care with EKG showing AF with RVR>>spontaneouslyconverted to sinus in the ED -Remains in SB with rates in the 50's which she states is at baseline for her -CHADSVASC =6(female, age, HTN, CAD, DM2, CHF) -CTA performed yesterday in the setting of inspiratory chest pain  which was negative for PE therefore after long discussion between Dr. Harrell Gave and the patient, plan is likely to not pursue AC at this time in the setting of complicated GI hx with bleeding. -Echo performed 08/20/19 with normal EF and G2DD  -Lopressor 12.5 mg changed to PRN given low BP and bradycardia -She does not typically have palpitations, so she was instructed her to check her BP/HR when she feels off, and if heart rate >110 she can take the PRN metoprolol.  Hx  of CAD s/p CABGx 3: -Chest painfelt to be in thesetting of the abovehowever continues to have chest pain with inspiration>>>CTA performed which was found to be negaitve for PR -Sed rate and CRP both elevated therefore will likely need to treat for pericarditis>>>see plan above   Acute on chronic Chronic Diastolic HF: -BNP mildly up at 264. Patient also has h/o of cirrhosis with ascitics and esophageal varices. She has been having lower leg edema over the last 2 months worse in the last couple days -EF preserved in 2017>>>echo this admission with normal EF and G2DD -Treated with IV Lasix which was transitioned to PO Lasix 7m -Creatinineremainsat baseline -Weight, 132lb today  -I&O, net positive 2967m HLD: -Last LDL, 61 this admission  -PTAZetia -No statin 2/2 to cirrhosis  HTN: -Low, 101/46>97/47>90/49 -Transition to PO Lasix 4034mD  -PRN lopressor   Heart murmur: -Echo in 2017 with mild AS and mild AI, mean gradient 12 mmHgecho as above -Echo 08/20/19 with normal EF and G2DD  DM2: -Restart PTA DM medications   Hypokalemia -Stabilized, 3.9 yesterday  -Goal >4   Consultants: The patient was seen and examined by Dr. ChrHarrell Gaveo feels that she is stable and ready for discharge.    Did the patient have an acute coronary syndrome (MI, NSTEMI, STEMI, etc) this admission?:  No.   The elevated Troponin was due to the acute medical illness (demand ischemia).  _____________  Discharge Vitals Blood pressure (!) 95/51, pulse (!) 56, temperature 98.1 F (36.7 C), temperature source Oral, resp. rate 18, height 5' 3"  (1.6 m), weight 60.2 kg, SpO2 96 %.  Filed Weights   08/20/19 0200 08/21/19 0500  Weight: 61.5 kg 60.2 kg   Labs & Radiologic Studies    CBC Recent Labs    08/20/19 0307 08/21/19 0415  WBC 8.0 4.0  HGB 12.4 12.2  HCT 37.7 37.8  MCV 94.5 94.7  PLT 117* 110347 Basic Metabolic Panel Recent Labs    08/19/19 1127 08/19/19 1731 08/20/19 0307    NA 140  --  139  K 3.3*  --  3.9  CL 104  --  106  CO2 25  --  24  GLUCOSE 164*  --  148*  BUN 19  --  19  CREATININE 0.96  --  1.02*  CALCIUM 9.5  --  9.1  MG  --  2.1  --    Liver Function Tests Recent Labs    08/19/19 1529  AST 63*  ALT 41  ALKPHOS 144*  BILITOT 2.2*  PROT 7.4  ALBUMIN 3.0*   No results for input(s): LIPASE, AMYLASE in the last 72 hours. High Sensitivity Troponin:   Recent Labs  Lab 08/19/19 1127 08/19/19 1529  TROPONINIHS 24* 27*    BNP Invalid input(s): POCBNP D-Dimer No results for input(s): DDIMER in the last 72 hours. Hemoglobin A1C Recent Labs    08/20/19 0307  HGBA1C 6.2*   Fasting Lipid Panel Recent Labs  08/20/19 0307  CHOL 128  HDL 58  LDLCALC 61  TRIG 44  CHOLHDL 2.2   Thyroid Function Tests Recent Labs    08/19/19 1530  TSH 3.704   _____________  DG Chest 2 View  Result Date: 08/19/2019 CLINICAL DATA:  Shortness of breath. EXAM: CHEST - 2 VIEW COMPARISON:  May 26, 2019. FINDINGS: The heart size and mediastinal contours are within normal limits. Both lungs are clear. No pneumothorax or pleural effusion is noted. Status post coronary bypass graft. The visualized skeletal structures are unremarkable. IMPRESSION: No active cardiopulmonary disease. Electronically Signed   By: Marijo Conception M.D.   On: 08/19/2019 11:49   CT ANGIO CHEST PE W OR WO CONTRAST  Result Date: 08/20/2019 CLINICAL DATA:  Shortness of breath. EXAM: CT ANGIOGRAPHY CHEST WITH CONTRAST TECHNIQUE: Multidetector CT imaging of the chest was performed using the standard protocol during bolus administration of intravenous contrast. Multiplanar CT image reconstructions and MIPs were obtained to evaluate the vascular anatomy. CONTRAST:  157m OMNIPAQUE IOHEXOL 350 MG/ML SOLN COMPARISON:  August 19, 2019. FINDINGS: Cardiovascular: Satisfactory opacification of the pulmonary arteries to the segmental level. No evidence of pulmonary embolism. Normal heart size.  No pericardial effusion. Status post coronary bypass graft. Mediastinum/Nodes: No enlarged mediastinal, hilar, or axillary lymph nodes. Thyroid gland, trachea, and esophagus demonstrate no significant findings. Lungs/Pleura: Minimal bilateral posterior basilar subsegmental atelectasis is noted. No pleural effusion or pneumothorax. Upper Abdomen: Patient is status post tips placement. Musculoskeletal: No chest wall abnormality. No acute or significant osseous findings. Review of the MIP images confirms the above findings. IMPRESSION: 1. No definite evidence of pulmonary embolus. 2. Minimal bilateral posterior basilar subsegmental atelectasis is noted. Electronically Signed   By: JMarijo ConceptionM.D.   On: 08/20/2019 14:41   ECHOCARDIOGRAM COMPLETE  Result Date: 08/20/2019    ECHOCARDIOGRAM REPORT   Patient Name:   Kayla TURKINGTONDate of Exam: 08/20/2019 Medical Rec #:  [id number]        Height:       63.0 in Accession #:    [id number]       Weight:       135.5 lb Date of Birth:  [birth year]        BSA:          1.639 m Patient Age:    89years          BP:           99/51 mmHg Patient Gender: F                 HR:           60 bpm. Exam Location:  Inpatient Procedure: 2D Echo, Color Doppler and Cardiac Doppler Indications:    I48.91* Unspecified atrial fibrillation  History:        Patient has prior history of Echocardiogram examinations, most                 recent 03/08/2016. Prior CABG, Arrythmias:Atrial Fibrillation;                 Risk Factors:Hypertension, Diabetes and Dyslipidemia.  Sonographer:    ERaquel SarnaSenior RDCS Referring Phys: [id number] 1. Left ventricular ejection fraction, by estimation, is 60 to 65%. The left ventricle has normal function. The left ventricle has no regional wall motion abnormalities. Left ventricular diastolic parameters are consistent with Grade II diastolic dysfunction (pseudonormalization). Elevated left atrial pressure.  2. Right ventricular  systolic function is normal. The right ventricular size is normal. There is normal pulmonary artery systolic pressure. The estimated right ventricular systolic pressure is 01.6 mmHg.  3. Left atrial size was mildly dilated.  4. The mitral valve is normal in structure. Mild mitral valve regurgitation.  5. Tricuspid valve regurgitation is mild to moderate.  6. The aortic valve is normal in structure. Aortic valve regurgitation is trivial.  7. The inferior vena cava is normal in size with greater than 50% respiratory variability, suggesting right atrial pressure of 3 mmHg. Comparison(s): Prior images unable to be directly viewed, comparison made by report only. FINDINGS  Left Ventricle: Left ventricular ejection fraction, by estimation, is 60 to 65%. The left ventricle has normal function. The left ventricle has no regional wall motion abnormalities. The left ventricular internal cavity size was normal in size. There is  no left ventricular hypertrophy. Left ventricular diastolic parameters are consistent with Grade II diastolic dysfunction (pseudonormalization). Elevated left atrial pressure. Right Ventricle: The right ventricular size is normal. No increase in right ventricular wall thickness. Right ventricular systolic function is normal. There is normal pulmonary artery systolic pressure. The tricuspid regurgitant velocity is 2.51 m/s, and  with an assumed right atrial pressure of 3 mmHg, the estimated right ventricular systolic pressure is 01.0 mmHg. Left Atrium: Left atrial size was mildly dilated. Right Atrium: Right atrial size was normal in size. Pericardium: There is no evidence of pericardial effusion. Mitral Valve: The mitral valve is normal in structure. There is mild thickening of the mitral valve leaflet(s). Mild mitral annular calcification. Mild mitral valve regurgitation. Tricuspid Valve: The tricuspid valve is normal in structure. Tricuspid valve regurgitation is mild to moderate. Aortic Valve: The  aortic valve is normal in structure. Aortic valve regurgitation is trivial. Pulmonic Valve: The pulmonic valve was grossly normal. Pulmonic valve regurgitation is not visualized. Aorta: The aortic root and ascending aorta are structurally normal, with no evidence of dilitation. Venous: The inferior vena cava is normal in size with greater than 50% respiratory variability, suggesting right atrial pressure of 3 mmHg. IAS/Shunts: No atrial level shunt detected by color flow Doppler.  LEFT VENTRICLE PLAX 2D LVIDd:         4.70 cm  Diastology LVIDs:         2.80 cm  LV e' lateral:   10.00 cm/s LV PW:         0.80 cm  LV E/e' lateral: 13.1 LV IVS:        0.90 cm  LV e' medial:    4.81 cm/s LVOT diam:     1.90 cm  LV E/e' medial:  27.2 LV SV:         55 LV SV Index:   34 LVOT Area:     2.84 cm  RIGHT VENTRICLE RV S prime:     8.93 cm/s TAPSE (M-mode): 2.0 cm LEFT ATRIUM             Index       RIGHT ATRIUM           Index LA diam:        4.10 cm 2.50 cm/m  RA Area:     12.80 cm LA Vol (A2C):   38.5 ml 23.49 ml/m RA Volume:   27.30 ml  16.66 ml/m LA Vol (A4C):   59.1 ml 36.06 ml/m LA Biplane Vol: 50.0 ml 30.51 ml/m  AORTIC VALVE LVOT Vmax:   89.50 cm/s LVOT Vmean:  56.300 cm/s LVOT VTI:    0.194 m  AORTA Ao Root diam: 3.00 cm MITRAL VALVE                TRICUSPID VALVE MV Area (PHT): 3.85 cm     TR Peak grad:   25.2 mmHg MV Decel Time: 197 msec     TR Vmax:        251.00 cm/s MV E velocity: 131.00 cm/s MV A velocity: 29.40 cm/s   SHUNTS MV E/A ratio:  4.46         Systemic VTI:  0.19 m                             Systemic Diam: 1.90 cm Dani Gobble Croitoru MD Electronically signed by Sanda Klein MD Signature Date/Time: 08/20/2019/10:57:28 AM    Final    Disposition   Pt is being discharged home today in good condition.  Follow-up Plans & Appointments   Follow-up Information    Tommie Raymond, NP Follow up on 09/10/2019.   Specialty: Cardiology Why: at 245pm  Contact information: [address] [place]  [address] [place] [address] [phone]          Discharge Instructions    Amb referral to AFIB Clinic   Complete by: As directed    Call MD for:  difficulty breathing, headache or visual disturbances   Complete by: As directed    Call MD for:  extreme fatigue   Complete by: As directed    Call MD for:  hives   Complete by: As directed    Call MD for:  persistant dizziness or light-headedness   Complete by: As directed    Call MD for:  persistant nausea and vomiting   Complete by: As directed    Call MD for:  redness, tenderness, or signs of infection (pain, swelling, redness, odor or green/yellow discharge around incision site)   Complete by: As directed    Call MD for:  severe uncontrolled pain   Complete by: As directed    Call MD for:  temperature >100.4   Complete by: As directed    Diet - low sodium heart healthy   Complete by: As directed    Increase activity slowly   Complete by: As directed      Discharge Medications   Allergies as of 08/21/2019      Reactions   Kiwi Extract Anaphylaxis   Tdap [tetanus-diphth-acell Pertussis] Swelling, Other (See Comments)   Swelling at injection site, gets very hot   Statins Other (See Comments)   RHABDOMYOLYSIS   Latex Itching, Dermatitis, Rash   Tramadol Nausea And Vomiting      Medication List    TAKE these medications   acetaminophen 500 MG tablet Commonly known as: TYLENOL Take 500 mg by mouth at bedtime.   BD Pen Needle Nano U/F 32G X 4 MM Misc Generic drug: Insulin Pen Needle USE THREE TIMES DAILY AS DIRECTED What changed:   how much to take  how to take this  when to take this  additional instructions   Biotin 10000 MCG Tabs Take 10,000 mcg by mouth every morning.   colchicine 0.6 MG tablet Take 1 tablet (0.6 mg total) by mouth daily. Start taking on: August 22, 2019   ezetimibe 10 MG tablet Commonly known as: ZETIA Take one tablet by mouth once daily What changed:   how much to take  how  to take this  when to take this  additional instructions   feeding supplement (PRO-STAT SUGAR FREE 64) Liqd Take 30 mLs by mouth 3 (three) times daily with meals.   furosemide 40 MG tablet Commonly known as: LASIX Take 1 tablet (40 mg total) by mouth daily. Start taking on: August 22, 2019 What changed:   medication strength  how much to take  additional instructions   glucose blood test strip One Touch Ultra II strips. Use to test blood sugar three times daily. Dx: E11.65 What changed:   how much to take  how to take this  when to take this   INSULIN SYRINGE 1CC/31GX5/16" 31G X 5/16" 1 ML Misc USE AS DIRECTED DAILY WITH LEVEMIR. Dx: E11.65 What changed:   how much to take  how to take this  when to take this   Jardiance 25 MG Tabs tablet Generic drug: empagliflozin TAKE 1 TABLET BY MOUTH DAILY What changed: how much to take   lactulose 10 GM/15ML solution Commonly known as: CHRONULAC Take 30 g by mouth 3 (three) times daily.   Levemir FlexTouch 100 UNIT/ML FlexPen Generic drug: insulin detemir Inject 30 Units into the skin at bedtime.   loratadine 10 MG tablet Commonly known as: CLARITIN Take 10 mg by mouth daily as needed for allergies.   MAGNESIUM PO Take 500 mg by mouth daily.   metoprolol tartrate 25 MG tablet Commonly known as: LOPRESSOR Take 1 tablet (25 mg total) by mouth as needed (use as needed for HR greater than 110).   multivitamin with minerals tablet Take 1 tablet by mouth daily.   NovoLOG FlexPen 100 UNIT/ML FlexPen Generic drug: insulin aspart Inject 8-12 Units into the skin See admin instructions. 12 units at breakfast, 8 units at lunch and 12 at dinner   ondansetron 4 MG tablet Commonly known as: ZOFRAN Take 1 tablet (4 mg total) by mouth every 6 (six) hours as needed for nausea.   pantoprazole 40 MG tablet Commonly known as: PROTONIX Take 40 mg by mouth 2 (two) times daily.   PROBIOTIC DAILY PO Take 1 capsule by mouth  daily. Digestive Advantage Probiotic   rifaximin 550 MG Tabs tablet Commonly known as: XIFAXAN Take 1 tablet (550 mg total) by mouth 2 (two) times daily.   SYSTANE OP Place 1 drop into both eyes daily.   VITAMIN B 12 PO Take 1,000 mcg by mouth daily.       Outstanding Labs/Studies   None   Duration of Discharge Encounter   Greater than 30 minutes including physician time.  Signed, Kathyrn Drown, NP 08/21/2019, 1:17 PM

## 2019-08-21 NOTE — Patient Outreach (Signed)
[facility]) Care Management  08/21/2019  MAVIS GRAVELLE 30-Jun-1945 [id number]   Noted that member admitted to hospital on 4/12 for A-fib with RVR.  Hospital liaisons notified, will follow up with member/son post discharge.  [name], [place], MSN [place] [phone]

## 2019-08-21 NOTE — Consult Note (Signed)
   [facility] CM Inpatient Consult   08/21/2019  [name] Mar 01, 1946 [id number]   Patient is currently active with [facility] for chronic disease management services.  Patient has been engaged by a [place].[name] Kitchen  Our community based plan of care has focused on disease management and community resource support.    Patient will receive a post hospital call and will be evaluated for assessments and disease process education.    Plan:  Of note, [facility] Care Management services does not replace or interfere with any services that are needed or arranged by inpatient [facility] care management team.  For additional questions or referrals please contact:  Natividad Brood, RN BSN [facility] Liaison  [phone] business mobile phone Toll free office [phone]  Fax number: [phone] [name] [email or web address]

## 2019-08-21 NOTE — Progress Notes (Signed)
Progress Note  Patient Name: [name] Date of Encounter: 08/21/2019  Primary Cardiologist: Dr. [name], MD   Subjective   Pt is feeling well today. Continues to have chest pain with deep inspiration. SED rate and CRP both elevated therefore will likely pursue treatment for pericarditis given clinical symptoms.   Inpatient Medications    Scheduled Meds: . ezetimibe  10 mg Oral Daily  . furosemide  20 mg Intravenous BID  . insulin aspart  0-15 Units Subcutaneous TID WC  . insulin aspart  0-5 Units Subcutaneous QHS  . magnesium oxide  400 mg Oral Daily  . metoprolol tartrate  12.5 mg Oral BID  . pantoprazole  40 mg Oral BID  . rifaximin  550 mg Oral BID   Continuous Infusions:  PRN Meds: acetaminophen, ALPRAZolam, ondansetron (ZOFRAN) IV, zolpidem   Vital Signs    Vitals:   08/20/19 0700 08/20/19 1230 08/20/19 2036 08/21/19 0500  BP: (!) 93/44 (!) 90/49 (!) 97/47 (!) 101/46  Pulse: 66 61 67 63  Resp: 18 15 20 19   Temp:  97.9 F (36.6 C) 98.1 F (36.7 C) 98.1 F (36.7 C)  TempSrc:   Oral Oral  SpO2: 94% 97% 97% 96%  Weight:    60.2 kg  Height:        Intake/Output Summary (Last 24 hours) at 08/21/2019 0744 Last data filed at 08/21/2019 0500 Gross per 24 hour  Intake 240 ml  Output 900 ml  Net -660 ml   Filed Weights   08/20/19 0200 08/21/19 0500  Weight: 61.5 kg 60.2 kg    Physical Exam   General: Well developed, well nourished, NAD Neck: Negative for carotid bruits. No JVD Lungs: Diminished in right lower lobe. Breathing is unlabored. Cardiovascular: RRR with S1 S2. No murmurs, rubs.  Abdomen: Soft, non-tender, non-distended. No obvious abdominal masses. Extremities: Very mild right ankle edema. Radial  pulses 2+ bilaterally Neuro: Alert and oriented. No focal deficits. No facial asymmetry. MAE spontaneously. Psych: Responds to questions appropriately with normal affect.    Labs    Chemistry Recent Labs  Lab 08/19/19 1127  08/19/19 1529 08/20/19 0307  NA 140  --  139  K 3.3*  --  3.9  CL 104  --  106  CO2 25  --  24  GLUCOSE 164*  --  148*  BUN 19  --  19  CREATININE 0.96  --  1.02*  CALCIUM 9.5  --  9.1  PROT  --  7.4  --   ALBUMIN  --  3.0*  --   AST  --  63*  --   ALT  --  41  --   ALKPHOS  --  144*  --   BILITOT  --  2.2*  --   GFRNONAA 59*  --  55*  GFRAA >60  --  >60  ANIONGAP 11  --  9     Hematology Recent Labs  Lab 08/19/19 1127 08/20/19 0307 08/21/19 0415  WBC 6.4 8.0 4.0  RBC 4.26 3.99 3.99  HGB 13.0 12.4 12.2  HCT 40.7 37.7 37.8  MCV 95.5 94.5 94.7  MCH 30.5 31.1 30.6  MCHC 31.9 32.9 32.3  RDW 14.7 15.0 14.9  PLT 102* 117* 110*    Cardiac EnzymesNo results for input(s): TROPONINI in the last 168 hours. No results for input(s): TROPIPOC in the last 168 hours.   BNP Recent Labs  Lab 08/19/19 1530  BNP 264.9*  DDimer No results for input(s): DDIMER in the last 168 hours.   Radiology    DG Chest 2 View  Result Date: 08/19/2019 CLINICAL DATA:  Shortness of breath. EXAM: CHEST - 2 VIEW COMPARISON:  May 26, 2019. FINDINGS: The heart size and mediastinal contours are within normal limits. Both lungs are clear. No pneumothorax or pleural effusion is noted. Status post coronary bypass graft. The visualized skeletal structures are unremarkable. IMPRESSION: No active cardiopulmonary disease. Electronically Signed   By: Marijo Conception M.D.   On: 08/19/2019 11:49   CT ANGIO CHEST PE W OR WO CONTRAST  Result Date: 08/20/2019 CLINICAL DATA:  Shortness of breath. EXAM: CT ANGIOGRAPHY CHEST WITH CONTRAST TECHNIQUE: Multidetector CT imaging of the chest was performed using the standard protocol during bolus administration of intravenous contrast. Multiplanar CT image reconstructions and MIPs were obtained to evaluate the vascular anatomy. CONTRAST:  132m OMNIPAQUE IOHEXOL 350 MG/ML SOLN COMPARISON:  August 19, 2019. FINDINGS: Cardiovascular: Satisfactory opacification of the  pulmonary arteries to the segmental level. No evidence of pulmonary embolism. Normal heart size. No pericardial effusion. Status post coronary bypass graft. Mediastinum/Nodes: No enlarged mediastinal, hilar, or axillary lymph nodes. Thyroid gland, trachea, and esophagus demonstrate no significant findings. Lungs/Pleura: Minimal bilateral posterior basilar subsegmental atelectasis is noted. No pleural effusion or pneumothorax. Upper Abdomen: Patient is status post tips placement. Musculoskeletal: No chest wall abnormality. No acute or significant osseous findings. Review of the MIP images confirms the above findings. IMPRESSION: 1. No definite evidence of pulmonary embolus. 2. Minimal bilateral posterior basilar subsegmental atelectasis is noted. Electronically Signed   By: JMarijo ConceptionM.D.   On: 08/20/2019 14:41   ECHOCARDIOGRAM COMPLETE  Result Date: 08/20/2019    ECHOCARDIOGRAM REPORT   Patient Name:   Kayla SEARLESDate of Exam: 08/20/2019 Medical Rec #:  [id number]        Height:       63.0 in Accession #:    [id number]       Weight:       135.5 lb Date of Birth:  [birth year]        BSA:          1.639 m Patient Age:    74years          BP:           99/51 mmHg Patient Gender: F                 HR:           60 bpm. Exam Location:  Inpatient Procedure: 2D Echo, Color Doppler and Cardiac Doppler Indications:    I48.91* Unspecified atrial fibrillation  History:        Patient has prior history of Echocardiogram examinations, most                 recent 03/08/2016. Prior CABG, Arrythmias:Atrial Fibrillation;                 Risk Factors:Hypertension, Diabetes and Dyslipidemia.  Sonographer:    ERaquel SarnaSenior RDCS Referring Phys: [id number] 1. Left ventricular ejection fraction, by estimation, is 60 to 65%. The left ventricle has normal function. The left ventricle has no regional wall motion abnormalities. Left ventricular diastolic parameters are consistent with Grade II  diastolic dysfunction (pseudonormalization). Elevated left atrial pressure.  2. Right ventricular systolic function is normal. The right ventricular size is normal. There is normal pulmonary artery  systolic pressure. The estimated right ventricular systolic pressure is 67.1 mmHg.  3. Left atrial size was mildly dilated.  4. The mitral valve is normal in structure. Mild mitral valve regurgitation.  5. Tricuspid valve regurgitation is mild to moderate.  6. The aortic valve is normal in structure. Aortic valve regurgitation is trivial.  7. The inferior vena cava is normal in size with greater than 50% respiratory variability, suggesting right atrial pressure of 3 mmHg. Comparison(s): Prior images unable to be directly viewed, comparison made by report only. FINDINGS  Left Ventricle: Left ventricular ejection fraction, by estimation, is 60 to 65%. The left ventricle has normal function. The left ventricle has no regional wall motion abnormalities. The left ventricular internal cavity size was normal in size. There is  no left ventricular hypertrophy. Left ventricular diastolic parameters are consistent with Grade II diastolic dysfunction (pseudonormalization). Elevated left atrial pressure. Right Ventricle: The right ventricular size is normal. No increase in right ventricular wall thickness. Right ventricular systolic function is normal. There is normal pulmonary artery systolic pressure. The tricuspid regurgitant velocity is 2.51 m/s, and  with an assumed right atrial pressure of 3 mmHg, the estimated right ventricular systolic pressure is 24.5 mmHg. Left Atrium: Left atrial size was mildly dilated. Right Atrium: Right atrial size was normal in size. Pericardium: There is no evidence of pericardial effusion. Mitral Valve: The mitral valve is normal in structure. There is mild thickening of the mitral valve leaflet(s). Mild mitral annular calcification. Mild mitral valve regurgitation. Tricuspid Valve: The tricuspid  valve is normal in structure. Tricuspid valve regurgitation is mild to moderate. Aortic Valve: The aortic valve is normal in structure. Aortic valve regurgitation is trivial. Pulmonic Valve: The pulmonic valve was grossly normal. Pulmonic valve regurgitation is not visualized. Aorta: The aortic root and ascending aorta are structurally normal, with no evidence of dilitation. Venous: The inferior vena cava is normal in size with greater than 50% respiratory variability, suggesting right atrial pressure of 3 mmHg. IAS/Shunts: No atrial level shunt detected by color flow Doppler.  LEFT VENTRICLE PLAX 2D LVIDd:         4.70 cm  Diastology LVIDs:         2.80 cm  LV e' lateral:   10.00 cm/s LV PW:         0.80 cm  LV E/e' lateral: 13.1 LV IVS:        0.90 cm  LV e' medial:    4.81 cm/s LVOT diam:     1.90 cm  LV E/e' medial:  27.2 LV SV:         55 LV SV Index:   34 LVOT Area:     2.84 cm  RIGHT VENTRICLE RV S prime:     8.93 cm/s TAPSE (M-mode): 2.0 cm LEFT ATRIUM             Index       RIGHT ATRIUM           Index LA diam:        4.10 cm 2.50 cm/m  RA Area:     12.80 cm LA Vol (A2C):   38.5 ml 23.49 ml/m RA Volume:   27.30 ml  16.66 ml/m LA Vol (A4C):   59.1 ml 36.06 ml/m LA Biplane Vol: 50.0 ml 30.51 ml/m  AORTIC VALVE LVOT Vmax:   89.50 cm/s LVOT Vmean:  56.300 cm/s LVOT VTI:    0.194 m  AORTA Ao Root diam: 3.00 cm MITRAL VALVE  TRICUSPID VALVE MV Area (PHT): 3.85 cm     TR Peak grad:   25.2 mmHg MV Decel Time: 197 msec     TR Vmax:        251.00 cm/s MV E velocity: 131.00 cm/s MV A velocity: 29.40 cm/s   SHUNTS MV E/A ratio:  4.46         Systemic VTI:  0.19 m                             Systemic Diam: 1.90 cm Sanda Klein MD Electronically signed by Sanda Klein MD Signature Date/Time: 08/20/2019/10:57:28 AM    Final    Telemetry    08/21/19 SB with rates in the 50's>>asymptomatic  - Personally Reviewed  ECG    No new tracing as of 08/21/19- Personally Reviewed  Cardiac Studies    Echo 02/2016 Study Conclusions   - Left ventricle: The cavity size was normal. There was mild  concentric hypertrophy. Systolic function was normal. The  estimated ejection fraction was in the range of 60% to 65%. Wall  motion was normal; there were no regional wall motion  abnormalities. There was an increased relative contribution of  atrial contraction to ventricular filling. Doppler parameters are  consistent with abnormal left ventricular relaxation (grade 1  diastolic dysfunction).  - Aortic valve: Trileaflet; normal thickness leaflets. Although the  mean AVG and peak velocity are consistent with very mild  stenosis, the aortic valve opens normally. There was mild  regurgitation. Peak velocity (S): 247 cm/s. Mean gradient (S): 12  mm Hg. Valve area (VTI): 1.24 cm^2. Valve area (Vmax): 1.18 cm^2.  Valve area (Vmean): 1.12 cm^2. Regurgitation pressure half-time:  508 ms.  - Mitral valve: Calcified annulus. There was mild regurgitation.  - Left atrium: The atrium was mildly to moderately dilated.  - Pulmonary arteries: PA peak pressure: 38 mm Hg (S).  - Pericardium, extracardiac: There was a left pleural effusion.   Cardiac cath 01/2016  Prox RCA lesion, 75 %stenosed.  Ramus lesion, 95 %stenosed.  Prox LAD to Mid LAD lesion, 90 %stenosed.  Mid RCA to Dist RCA lesion, 100 %stenosed.  Dist RCA lesion, 100 %stenosed.  The left ventricular ejection fraction is 45-50% by visual estimate.  The left ventricular systolic function is normal.  LV end diastolic pressure is mildly elevated.   Acute coronary syndrome with recent total occlusion of the dominant RCA, high-grade obstruction in the proximal LAD which Collateralizes the RCA, and high-grade obstruction and a moderate size ramus intermedius branch.  Left ventricular systolic dysfunction with inferobasal moderate hypokinesis and EF of 45-50%. Upper normal LV EDP.   RECOMMENDATIONS:  Long  discussion with the patient concerning treatment options which include multivessel PCI / stenting vs coronary bypass grafting with LIMA to the LAD. If PCI, RCA will require near full metal jacket. After discussion, the patient would like the approach that will give her the most durable long-term success.  IV nitroglycerin, IV fluid, high intensity statin therapy, aspirin therapy, and beta blocker therapy as tolerated.  Expedited CABG because of presentation and critical nature of disease.  Patient Profile     74 y.o. female with pmh of HTN, HLD, DM2, CAD s/p CABG in 2017 EF 60-65% 02/2016, ascites/cirrhosis, B/L carotid artery disease who is being seen for chest pain and afib RVR.  Assessment & Plan    1. New onset atrial fibrillation -Presented with a three day hx of day  atypical/pleuritic chest pain with associated SOB>>went to urgent care with EKG showing AF with RVR>>spontaneouslyconverted to sinus in the ED -Remains in SB with rates in the 50's which she states is at baseline for her -CHADSVASC =6(female, age, HTN, CAD, DM2, CHF) therefore will need long-term anticoagulation -CTA performed yesterday in the setting of inspiratory chest pain which was negative for PE therefore after long discussion between Dr. Harrell Gave and the patient, plan is likely to not pursue AC at this time in the setting of complicated GI hx with bleeding. -Echo performed 08/20/19 with normal EF and G2DD  -Lopressor 12.5 mg BID>>>previously on nadolol for varices  -BP soft but at baseline   2. Chest pain with hx of CAD s/p CABGx 3: -Chest pain felt to be in the setting of the above however continues to have chest pain with inspiration>>>CTA performed which was found to be negaitve for PR -Sed rate and CRP both elevated therefore will likely need to treat for pericarditis>>>will discuss plan with MD. Likely will need short course of high dose NSAID with colchicine. Could be complicated by GI hx  3. Acute on  chronic Chronic Diastolic HF: -BNP mildly up at 264. Patient also has h/o of cirrhosis with ascitics and esophageal varices. She has been having lower leg edema over the last 2 months worse in the last couple days -EF preserved in 2017>>>echo this admission with normal EF and G2DD -Would transition to PO Lasix at this time. Likely will need PO 75m QD?? PTA Lasix at 228mPO QD  -Creatinine remains at baseline -Weight, 132lb today  -I&O, net positive [id number]. HLD: -Last LDL, 61 this admission  -PTA Zetia -No statin 2/2 to cirrhosis  5. HTN: -Low, 101/46>97/47>90/49 -Transition to PO Lasix 60m61m  -Continue Lopressor as above. Hold for HR less than 50  6. Heart murmur: -Echo in 2017 with mild AS and mild AI, mean gradient 12 mmHgecho as above -Echo 08/20/19 with normal EF and G2DD  7. DM2: -SSI while inpatient   8. Hypokalemia -Stabilized, 3.9 yesterday  -Goal >4  Signed, JillKathyrn DrownC [facility] Pager: [phone]/2021, 7:44 AM     For questions or updates, please contact   Please consult [email or web address] for contact info under Cardiology/STEMI.

## 2019-08-22 ENCOUNTER — Telehealth: Payer: Self-pay | Admitting: *Deleted

## 2019-08-22 NOTE — Telephone Encounter (Signed)
Transition Care Management Follow-up Telephone Call  Date of discharge and from where: 08/21/2019 [facility]  How have you been since you were released from the hospital? "Getting There"  Any questions or concerns? No   Items Reviewed:  Did the pt receive and understand the discharge instructions provided? Yes   Medications obtained and verified? Yes   Any new allergies since your discharge? No   Dietary orders reviewed? Yes  Do you have support at home? Yes   Other (ie: DME, Home Health, etc) No Home Health  Functional Questionnaire: (I = Independent and D = Dependent) ADL's: I with assistance  Bathing/Dressing- I   Meal Prep- I  Eating- I  Maintaining continence- I  Transferring/Ambulation- I  Managing Meds- I   Follow up appointments reviewed:    PCP Hospital f/u appt confirmed? Yes  Scheduled to see Dr. Mariea Clonts on 08/26/19 @ 9:30.  [facility] f/u appt confirmed? Yes  Scheduled to see Cardiology on 09/10/19.  Are transportation arrangements needed? No   If their condition worsens, is the pt aware to call  their PCP or go to the ED? Yes  Was the patient provided with contact information for the PCP's office or ED? Yes  Was the pt encouraged to call back with questions or concerns? Yes

## 2019-08-23 ENCOUNTER — Other Ambulatory Visit: Payer: Self-pay | Admitting: *Deleted

## 2019-08-23 NOTE — Patient Outreach (Signed)
[facility]) Care Management  08/23/2019  [name] [birth year] [id number]   Noted that member was discharged from hospital on 4/14.  Call placed to son, [name], to follow up on discharge.  He report member is no longer having chest pain/discomfort. They are monitoring her blood pressure and heart rate daily.  He is unable to report readings at this time because he is not in the home.  She has follow up appointment scheduled with cardiology on 5/4.  Report understanding of when to seek medical attention and notify provider with any changes.  Denies any questions at this time, will follow up within the next month.  [facility] CM Care Plan Problem One     Most Recent Value  Care Plan Problem One  Risk for readmission related to abnormal HR as evidenced by Recent hospitalizations  Role Documenting the Problem One  Care Management [place] for Problem One  Active  [facility] Long Term Goal   Member will not be readmitted to hospital within the next 31 days  [facility] Long Term Goal Start Date  08/23/19  Interventions for Problem One Long Term Goal  Discharge AVS reviewed with son.  Encouraged to notify PCP of recent admisison to inquire about sooner appointment  [facility] CM Short Term Goal #1   Member will keep and attend follow up appointment with cardiology within the next 3 weeks  [facility] CM Short Term Goal #1 Start Date  08/23/19  Interventions for Short Term Goal #1  Appointment reviewed with son, encouraged to monitor and record HR & BP daily for trends  [facility] CM Short Term Goal #2   Member will report decrease in abdominal pain (related to hernia) within the next 4 weeks  [facility] CM Short Term Goal #2 Start Date  08/12/19  [facility] CM Short Term Goal #2 Met Date  08/23/19     [name], RN, MSN [place] [phone]

## 2019-08-25 ENCOUNTER — Other Ambulatory Visit: Payer: Self-pay | Admitting: Gastroenterology

## 2019-08-25 ENCOUNTER — Other Ambulatory Visit: Payer: Self-pay | Admitting: Internal Medicine

## 2019-08-25 DIAGNOSIS — K7460 Unspecified cirrhosis of liver: Secondary | ICD-10-CM

## 2019-08-25 DIAGNOSIS — R188 Other ascites: Secondary | ICD-10-CM

## 2019-08-25 DIAGNOSIS — E1165 Type 2 diabetes mellitus with hyperglycemia: Secondary | ICD-10-CM

## 2019-08-26 ENCOUNTER — Other Ambulatory Visit: Payer: Self-pay

## 2019-08-26 ENCOUNTER — Ambulatory Visit (INDEPENDENT_AMBULATORY_CARE_PROVIDER_SITE_OTHER): Payer: Medicare Other | Admitting: Internal Medicine

## 2019-08-26 ENCOUNTER — Encounter: Payer: Self-pay | Admitting: Internal Medicine

## 2019-08-26 VITALS — BP 110/62 | HR 78 | Temp 97.5°F | Wt 136.1 lb

## 2019-08-26 DIAGNOSIS — Z794 Long term (current) use of insulin: Secondary | ICD-10-CM | POA: Diagnosis not present

## 2019-08-26 DIAGNOSIS — I4891 Unspecified atrial fibrillation: Secondary | ICD-10-CM

## 2019-08-26 DIAGNOSIS — I308 Other forms of acute pericarditis: Secondary | ICD-10-CM

## 2019-08-26 DIAGNOSIS — K7469 Other cirrhosis of liver: Secondary | ICD-10-CM

## 2019-08-26 DIAGNOSIS — R300 Dysuria: Secondary | ICD-10-CM | POA: Diagnosis not present

## 2019-08-26 DIAGNOSIS — Z7189 Other specified counseling: Secondary | ICD-10-CM | POA: Diagnosis not present

## 2019-08-26 DIAGNOSIS — E1165 Type 2 diabetes mellitus with hyperglycemia: Secondary | ICD-10-CM | POA: Diagnosis not present

## 2019-08-26 LAB — POCT URINALYSIS DIPSTICK
Bilirubin, UA: NEGATIVE
Glucose, UA: POSITIVE — AB
Ketones, UA: NEGATIVE
Nitrite, UA: NEGATIVE
Protein, UA: NEGATIVE
Spec Grav, UA: 1.02 (ref 1.010–1.025)
Urobilinogen, UA: 0.2 E.U./dL
pH, UA: 6 (ref 5.0–8.0)

## 2019-08-26 NOTE — Telephone Encounter (Signed)
rx sent to pharmacy by e-script  

## 2019-08-26 NOTE — Progress Notes (Signed)
Location:  [facility] clinic Provider: Kimya Mccahill L. Mariea Conway, D.O., C.M.D.  Code Status: FULL CODE Goals of Care:  Advanced Directives 08/26/2019  Does Patient Have a Medical Advance Directive? Yes  Type of Paramedic of [place];Living will;Out of facility DNR (pink MOST or yellow form)  Does patient want to make changes to medical advance directive? No - Patient declined  Would patient like information on creating a medical advance directive? -     Chief Complaint  Patient presents with  . Transitions Of Care    Dicharged on 08/21/19    HPI: Patient is a 74 y.o. female seen today for hospital follow-up s/p admission from 4/12-4/14/21 for pleuritic chest pain felt to be due to afib with RVR that spontaneously converted.  She had a normal echo, no PE on CT, mildly elevated inflammatory markers.  She was treated for pericarditis, but opted to use just colchicine rather than nsaids and no NOAC for afib as discussed with family and Dr. Harrell Gave (cardiology).  Not having any chest pain anymore.  Is now having some pleuritic pain in her left lower ribs over the weekend.  Does not notice a difference in bowels with the colchicine since on lactulose and has to stay close the to bathroom.  She is to take the colchicine for 3 months.  She really liked Dr. Harrell Gave.    She still does not want to take the covid vaccine b/c of concerns of what it will do to her.  Her husband is going along with what she says.  I reviewed with them that covid would definitely make them both very sick and possibly kill them.    She feels like she may have a UTI coming on.  Not sure if it's that or dryness.  She's irritated after urinating.  Mr says she has increased frequency and is on 73m lasix.  No suprapubic pain or flank pain.    She says she is drinking something all day long--water, gatorade, body armour, protein drink.  No fever, chills either.  Her son and daughter-in-law have her on a diet  for her liver and she's been doing better.  They fix the meals and brought them over for 2 wks and now they're "turned loose".    Sugars:  CBG yesterday am 97, 112 day before.   Every now and then has a low--70s.  [name] it was lower at one point--49 but it's been a while since then.  She ate and it resolved.  She does have glucose gel or tablets to use.  Has appt for MRI of hernia and TIPS f/u on 4/30.    Past Medical History:  Diagnosis Date  . Allergy   . Arthritis    neck  . Cataract    bilateral - MD monitoring cataracts  . CHF (congestive heart failure) ([place]   . Chronic kidney disease, stage I    DR OTTELIN  HX UTIS  . [place]osis [place]   . Cramp of limb   . Diabetes mellitus   . Dysphagia, unspecified(787.20)   . Dysuria   . Epistaxis   . GERD (gastroesophageal reflux disease)   . Heart murmur    NO CARDIOLOGIST  DX FOR YEARS ASYMPTOMATIC  . Hepatic encephalopathy ([place]   . Lumbago   . Neoplasm of uncertain behavior of skin   . Nonspecific elevation of levels of transaminase or lactic acid dehydrogenase (LDH)   . Osteoarthrosis, unspecified whether generalized or localized, unspecified site   .  Other and unspecified hyperlipidemia    diet controlled  . Pain in joint, shoulder region   . Paresthesias 04/01/2015  . Postablative ovarian failure   . Trochanteric bursitis of left hip 12/15/2015  . Type 2 diabetes mellitus without complication ([place])   . Unspecified essential hypertension    no meds    Past Surgical History:  Procedure Laterality Date  . BREAST BIOPSY    . CARDIAC CATHETERIZATION N/A 01/27/2016   Procedure: Left Heart Cath and Coronary Angiography;  Surgeon: Belva Crome, MD;  Location: [place] CV LAB;  Service: Cardiovascular;  Laterality: N/A;  . COLONOSCOPY  2012   Dr Lajoyce Corners.   . COLONOSCOPY WITH PROPOFOL N/A 07/07/2016   Procedure: COLONOSCOPY WITH PROPOFOL;  Surgeon: [name], MD;  Location: [facility];  Service: Endoscopy;  Laterality:  N/A;  . CORONARY ARTERY BYPASS GRAFT N/A 01/28/2016   Procedure: CORONARY ARTERY BYPASS GRAFTING (CABG) x 3 USING RIGHT LEG GREATER SAPHENOUS VEIN GRAFT;  Surgeon: Melrose Nakayama, MD;  Location: [place];  Service: Open Heart Surgery;  Laterality: N/A;  . ENDOVEIN HARVEST OF GREATER SAPHENOUS VEIN Right 01/28/2016   Procedure: ENDOVEIN HARVEST OF GREATER SAPHENOUS VEIN;  Surgeon: Melrose Nakayama, MD;  Location: [place];  Service: Open Heart Surgery;  Laterality: Right;  . ESOPHAGEAL BANDING  03/28/2019   Procedure: ESOPHAGEAL BANDING;  Surgeon: [name], MD;  Location: [facility];  Service: Endoscopy;;  . ESOPHAGEAL BANDING  04/07/2019   Procedure: ESOPHAGEAL BANDING;  Surgeon: Juanita Craver, MD;  Location: [facility] ENDOSCOPY;  Service: Endoscopy;;  . ESOPHAGOGASTRODUODENOSCOPY N/A 04/07/2019   Procedure: ESOPHAGOGASTRODUODENOSCOPY (EGD);  Surgeon: Juanita Craver, MD;  Location: [facility] ENDOSCOPY;  Service: Endoscopy;  Laterality: N/A;  . ESOPHAGOGASTRODUODENOSCOPY (EGD) WITH PROPOFOL N/A 07/07/2016   Procedure: ESOPHAGOGASTRODUODENOSCOPY (EGD) WITH PROPOFOL;  Surgeon: [name], MD;  Location: [facility];  Service: Endoscopy;  Laterality: N/A;  . ESOPHAGOGASTRODUODENOSCOPY (EGD) WITH PROPOFOL N/A 03/28/2019   Procedure: ESOPHAGOGASTRODUODENOSCOPY (EGD) WITH PROPOFOL;  Surgeon: [name], MD;  Location: [facility];  Service: Endoscopy;  Laterality: N/A;  . HEMOSTASIS CLIP PLACEMENT  04/07/2019   Procedure: HEMOSTASIS CLIP PLACEMENT;  Surgeon: Juanita Craver, MD;  Location: [place] ENDOSCOPY;  Service: Endoscopy;;  . IR ANGIOGRAM SELECTIVE EACH ADDITIONAL VESSEL  04/08/2019  . IR EMBO ART  VEN HEMORR LYMPH EXTRAV  INC GUIDE ROADMAPPING  04/08/2019  . IR PARACENTESIS  04/08/2019  . IR RADIOLOGIST EVAL & MGMT  06/13/2019  . IR TIPS  04/08/2019  . MAXIMUM ACCESS (MAS)POSTERIOR LUMBAR INTERBODY FUSION (PLIF) 1 LEVEL Left 06/10/2015   Procedure: FOR MAXIMUM ACCESS (MAS) POSTERIOR LUMBAR  INTERBODY FUSION (PLIF) LUMBAR THREE-FOUR EXTRAFORAMINAL MICRODISCECTOMY LUMBAR FIVE-SACRAL ONE LEFT;  Surgeon: Eustace Moore, MD;  Location: [place] NEURO ORS;  Service: Neurosurgery;  Laterality: Left;  . RADIOLOGY WITH ANESTHESIA N/A 04/08/2019   Procedure: RADIOLOGY WITH ANESTHESIA;  Surgeon: Radiologist, Medication, MD;  Location: [place];  Service: Radiology;  Laterality: N/A;  . SCLEROTHERAPY  04/07/2019   Procedure: SCLEROTHERAPY;  Surgeon: Juanita Craver, MD;  Location: [facility] ENDOSCOPY;  Service: Endoscopy;;  . TEE WITHOUT CARDIOVERSION N/A 01/28/2016   Procedure: TRANSESOPHAGEAL ECHOCARDIOGRAM (TEE);  Surgeon: Melrose Nakayama, MD;  Location: [place];  Service: Open Heart Surgery;  Laterality: N/A;  . TUBAL LIGATION  1982   Dr Connye Burkitt  . UPPER GASTROINTESTINAL ENDOSCOPY    . VAGINAL HYSTERECTOMY  1997   Dr Rande Lawman    Allergies  Allergen Reactions  . Kiwi Extract Anaphylaxis  .  Tdap [Tetanus-Diphth-Acell Pertussis] Swelling and Other (See Comments)    Swelling at injection site, gets very hot  . Statins Other (See Comments)    RHABDOMYOLYSIS  . Latex Itching, Dermatitis and Rash  . Tramadol Nausea And Vomiting    Outpatient Encounter Medications as of 08/26/2019  Medication Sig  . acetaminophen (TYLENOL) 500 MG tablet Take 500 mg by mouth at bedtime.   . Amino Acids-Protein Hydrolys (FEEDING SUPPLEMENT, PRO-STAT SUGAR FREE 64,) LIQD Take 30 mLs by mouth 3 (three) times daily with meals.  . BD PEN NEEDLE NANO U/F 32G X 4 MM MISC USE THREE TIMES DAILY AS DIRECTED  . Biotin 10000 MCG TABS Take 10,000 mcg by mouth every morning.  . colchicine 0.6 MG tablet Take 1 tablet (0.6 mg total) by mouth daily.  . Cyanocobalamin (VITAMIN B 12 PO) Take 1,000 mcg by mouth daily.    [name] Kitchen ezetimibe (ZETIA) 10 MG tablet Take one tablet by mouth once daily  . furosemide (LASIX) 40 MG tablet Take 1 tablet (40 mg total) by mouth daily.  [name] Kitchen glucose blood test strip One Touch Ultra II strips. Use to test blood sugar  three times daily. Dx: E11.65  . insulin aspart (NOVOLOG FLEXPEN) 100 UNIT/ML FlexPen Inject 8-12 Units into the skin See admin instructions. 12 units at breakfast, 8 units at lunch and 12 at dinner   . insulin detemir (LEVEMIR FLEXTOUCH) 100 UNIT/ML FlexPen Inject 30 Units into the skin at bedtime.   . Insulin Syringe-Needle U-100 (INSULIN SYRINGE 1CC/31GX5/16") 31G X 5/16" 1 ML MISC USE AS DIRECTED DAILY WITH LEVEMIR. Dx: E11.65  . JARDIANCE 25 MG TABS tablet TAKE 1 TABLET BY MOUTH DAILY  . lactulose (CHRONULAC) 10 GM/15ML solution Take by mouth 3 (three) times daily. Take 45 mg per day  . loratadine (CLARITIN) 10 MG tablet Take 10 mg by mouth daily as needed for allergies.  [name] Kitchen MAGNESIUM PO Take 500 mg by mouth daily.   . metoprolol tartrate (LOPRESSOR) 25 MG tablet Take 1 tablet (25 mg total) by mouth as needed (use as needed for HR greater than 110).  . Multiple Vitamins-Minerals (MULTIVITAMIN WITH MINERALS) tablet Take 1 tablet by mouth daily.    [name] Kitchen omeprazole (PRILOSEC) 20 MG capsule Take 20 mg by mouth daily.  . ondansetron (ZOFRAN) 4 MG tablet Take 1 tablet (4 mg total) by mouth every 6 (six) hours as needed for nausea.  . pantoprazole (PROTONIX) 40 MG tablet Take 40 mg by mouth 2 (two) times daily.  Vladimir Faster Glycol-Propyl Glycol (SYSTANE OP) Place 1 drop into both eyes daily.   . Probiotic Product (PROBIOTIC DAILY PO) Take 1 capsule by mouth daily. Digestive Advantage Probiotic  . rifaximin (XIFAXAN) 550 MG TABS tablet Take 1 tablet (550 mg total) by mouth 2 (two) times daily.  . [DISCONTINUED] lactulose (CHRONULAC) 10 GM/15ML solution Take 30 g by mouth 3 (three) times daily.   No facility-administered encounter medications on file as of 08/26/2019.    Review of Systems:  Review of Systems  Constitutional: Positive for malaise/fatigue. Negative for chills and fever.       Improved though from prior visits we've had past several months  HENT: Positive for hearing loss. Negative for  congestion and sore throat.   Eyes: Negative for blurred vision.  Respiratory: Negative for cough and shortness of breath.   Cardiovascular: Positive for leg swelling. Negative for chest pain, palpitations, orthopnea and PND.       Mild lower left pleuritic pain at  times with really deep breaths  Gastrointestinal: Positive for diarrhea. Negative for abdominal pain, blood in stool, constipation and melena.  Genitourinary: Positive for dysuria. Negative for flank pain, frequency, hematuria and urgency.  Musculoskeletal: Negative for falls and myalgias.  Skin: Negative for itching and rash.  Neurological: Positive for tingling and sensory change. Negative for dizziness and loss of consciousness.  Endo/Heme/Allergies: Bruises/bleeds easily.  Psychiatric/Behavioral: Positive for memory loss. Negative for depression. The patient is not nervous/anxious and does not have insomnia.        Her husband helped with history some    Health Maintenance  Topic Date Due  . COVID-19 Vaccine (1) Never done  . OPHTHALMOLOGY EXAM  08/24/2018  . FOOT EXAM  10/04/2019  . INFLUENZA VACCINE  12/08/2019  . URINE MICROALBUMIN  01/31/2020  . HEMOGLOBIN A1C  02/19/2020  . MAMMOGRAM  09/25/2020  . COLONOSCOPY  07/07/2021  . DEXA SCAN  Completed  . Hepatitis C Screening  Completed  . PNA vac Low Risk Adult  Completed  . TETANUS/TDAP  Discontinued    Physical Exam: Vitals:   08/26/19 0923  BP: 110/62  Pulse: 78  Temp: (!) 97.5 F (36.4 C)  TempSrc: Temporal  SpO2: 96%  Weight: 136 lb 1.6 oz (61.7 kg)   Body mass index is 24.11 kg/m. Physical Exam Constitutional:      General: She is not in acute distress.    Appearance: She is ill-appearing (but much improved from prior visits with me virtually). She is not toxic-appearing.  HENT:     Head: Normocephalic and atraumatic.     Right Ear: External ear normal.     Left Ear: External ear normal.  Cardiovascular:     Rate and Rhythm: Normal rate and  regular rhythm.     Pulses: Normal pulses.     Heart sounds: Normal heart sounds.  Pulmonary:     Effort: Pulmonary effort is normal.     Breath sounds: Normal breath sounds. No wheezing, rhonchi or rales.     Comments: No pleuritic pain during breaths during visit Abdominal:     General: Bowel sounds are normal. There is no distension.     Palpations: Abdomen is soft. There is no mass.     Tenderness: There is no abdominal tenderness. There is no guarding or rebound.     Comments: No ascites today, 1+ edema of ankles and feet  Skin:    Coloration: Skin is jaundiced (mild grayish yellow skin tone).  Neurological:     General: No focal deficit present.     Mental Status: She is alert and oriented to person, place, and time. Mental status is at baseline.     Motor: Weakness present.     Gait: Gait normal.  Psychiatric:        Mood and Affect: Mood normal.        Behavior: Behavior normal.     Labs reviewed: Basic Metabolic Panel: Recent Labs    05/30/19 0424 05/30/19 0424 05/31/19 0500 05/31/19 0500 06/01/19 0239 06/01/19 0239 06/11/19 0000 08/19/19 1127 08/19/19 1530 08/19/19 1731 08/20/19 0307  NA 139   < > 137   < > 140  --  137 140  --   --  139  K 4.0   < > 3.6   < > 3.6   < > 4.3 3.3*  --   --  3.9  CL 111   < > 110   < > 116*   < >  98* 104  --   --  106  CO2 20*   < > 19*   < > 18*   < > 22 25  --   --  24  GLUCOSE 192*   < > 239*   < > 239*  --   --  164*  --   --  148*  BUN 13   < > 15   < > 12  --  24* 19  --   --  19  CREATININE 0.88   < > 0.93   < > 0.82  --  0.9  0.9 0.96  --   --  1.02*  CALCIUM 9.6   < > 9.1   < > 9.1   < > 10.0 9.5  --   --  9.1  MG 2.0   < > 1.9  --  1.6*  --   --   --   --  2.1  --   PHOS 2.0*  --  3.3  --  3.5  --   --   --   --   --   --   TSH  --   --   --   --   --   --   --   --  3.704  --   --    < > = values in this interval not displayed.   Liver Function Tests: Recent Labs    05/26/19 1332 05/26/19 1332 05/27/19 0352  05/28/19 0257 06/01/19 0239 06/11/19 0000 08/19/19 1529  AST 60*   < > 53*  --   --  80* 63*  ALT 45*   < > 36  --   --  42* 41  ALKPHOS 190*   < > 148*  --   --  259* 144*  BILITOT 3.1*  --  2.2*  --   --   --  2.2*  PROT 7.4  --  5.8*  --   --   --  7.4  ALBUMIN 2.8*   < > 2.2*   < > 1.9* 2.7* 3.0*   < > = values in this interval not displayed.   Recent Labs    04/06/19 1300 05/15/19 1042 05/26/19 1332  LIPASE 23 29 25    Recent Labs    05/15/19 1042 05/18/19 1456 05/26/19 1521  AMMONIA 111* 39* 41*   CBC: Recent Labs    05/31/19 0500 05/31/19 0500 06/01/19 0239 06/01/19 0239 06/11/19 0000 08/19/19 1127 08/20/19 0307 08/21/19 0415  WBC 3.5*   < > 3.9*   < > 5.0 6.4 8.0 4.0  NEUTROABS 1.8  --  2.2  --  3  --   --   --   HGB 12.4   < > 12.2   < > 13.2 13.0 12.4 12.2  HCT 36.3   < > 35.4*   < > 38 40.7 37.7 37.8  MCV 98.9   < > 97.3   < >  --  95.5 94.5 94.7  PLT 77*   < > 82*   < > 125* 102* 117* 110*   < > = values in this interval not displayed.   Lipid Panel: Recent Labs    08/20/19 0307  CHOL 128  HDL 58  LDLCALC 61  TRIG 44  CHOLHDL 2.2   Lab Results  Component Value Date   HGBA1C 6.2 (H) 08/20/2019    Procedures since last visit: DG Chest 2 View  Result  Date: 08/19/2019 CLINICAL DATA:  Shortness of breath. EXAM: CHEST - 2 VIEW COMPARISON:  May 26, 2019. FINDINGS: The heart size and mediastinal contours are within normal limits. Both lungs are clear. No pneumothorax or pleural effusion is noted. Status post coronary bypass graft. The visualized skeletal structures are unremarkable. IMPRESSION: No active cardiopulmonary disease. Electronically Signed   By: Marijo Conception M.D.   On: 08/19/2019 11:49   CT ANGIO CHEST PE W OR WO CONTRAST  Result Date: 08/20/2019 CLINICAL DATA:  Shortness of breath. EXAM: CT ANGIOGRAPHY CHEST WITH CONTRAST TECHNIQUE: Multidetector CT imaging of the chest was performed using the standard protocol during bolus  administration of intravenous contrast. Multiplanar CT image reconstructions and MIPs were obtained to evaluate the vascular anatomy. CONTRAST:  162m OMNIPAQUE IOHEXOL 350 MG/ML SOLN COMPARISON:  August 19, 2019. FINDINGS: Cardiovascular: Satisfactory opacification of the pulmonary arteries to the segmental level. No evidence of pulmonary embolism. Normal heart size. No pericardial effusion. Status post coronary bypass graft. Mediastinum/Nodes: No enlarged mediastinal, hilar, or axillary lymph nodes. Thyroid gland, trachea, and esophagus demonstrate no significant findings. Lungs/Pleura: Minimal bilateral posterior basilar subsegmental atelectasis is noted. No pleural effusion or pneumothorax. Upper Abdomen: Patient is status post tips placement. Musculoskeletal: No chest wall abnormality. No acute or significant osseous findings. Review of the MIP images confirms the above findings. IMPRESSION: 1. No definite evidence of pulmonary embolus. 2. Minimal bilateral posterior basilar subsegmental atelectasis is noted. Electronically Signed   By: JMarijo ConceptionM.D.   On: 08/20/2019 14:41   ECHOCARDIOGRAM COMPLETE  Result Date: 08/20/2019    ECHOCARDIOGRAM REPORT   Patient Name:   Kayla OLSONDate of Exam: 08/20/2019 Medical Rec #:  [id number]        Height:       63.0 in Accession #:    [id number]       Weight:       135.5 lb Date of Birth:  [birth year]        BSA:          1.639 m Patient Age:    89years          BP:           99/51 mmHg Patient Gender: F                 HR:           60 bpm. Exam Location:  Inpatient Procedure: 2D Echo, Color Doppler and Cardiac Doppler Indications:    I48.91* Unspecified atrial fibrillation  History:        Patient has prior history of Echocardiogram examinations, most                 recent 03/08/2016. Prior CABG, Arrythmias:Atrial Fibrillation;                 Risk Factors:Hypertension, Diabetes and Dyslipidemia.  Sonographer:    ERaquel SarnaSenior RDCS Referring Phys: [id number] [place] 1. Left ventricular ejection fraction, by estimation, is 60 to 65%. The left ventricle has normal function. The left ventricle has no regional wall motion abnormalities. Left ventricular diastolic parameters are consistent with Grade II diastolic dysfunction (pseudonormalization). Elevated left atrial pressure.  2. Right ventricular systolic function is normal. The right ventricular size is normal. There is normal pulmonary artery systolic pressure. The estimated right ventricular systolic pressure is 226.3mmHg.  3. Left atrial size was mildly dilated.  4. The mitral  valve is normal in structure. Mild mitral valve regurgitation.  5. Tricuspid valve regurgitation is mild to moderate.  6. The aortic valve is normal in structure. Aortic valve regurgitation is trivial.  7. The inferior vena cava is normal in size with greater than 50% respiratory variability, suggesting right atrial pressure of 3 mmHg. Comparison(s): Prior images unable to be directly viewed, comparison made by report only. FINDINGS  Left Ventricle: Left ventricular ejection fraction, by estimation, is 60 to 65%. The left ventricle has normal function. The left ventricle has no regional wall motion abnormalities. The left ventricular internal cavity size was normal in size. There is  no left ventricular hypertrophy. Left ventricular diastolic parameters are consistent with Grade II diastolic dysfunction (pseudonormalization). Elevated left atrial pressure. Right Ventricle: The right ventricular size is normal. No increase in right ventricular wall thickness. Right ventricular systolic function is normal. There is normal pulmonary artery systolic pressure. The tricuspid regurgitant velocity is 2.51 m/s, and  with an assumed right atrial pressure of 3 mmHg, the estimated right ventricular systolic pressure is 44.3 mmHg. Left Atrium: Left atrial size was mildly dilated. Right Atrium: Right atrial size was normal in size.  Pericardium: There is no evidence of pericardial effusion. Mitral Valve: The mitral valve is normal in structure. There is mild thickening of the mitral valve leaflet(s). Mild mitral annular calcification. Mild mitral valve regurgitation. Tricuspid Valve: The tricuspid valve is normal in structure. Tricuspid valve regurgitation is mild to moderate. Aortic Valve: The aortic valve is normal in structure. Aortic valve regurgitation is trivial. Pulmonic Valve: The pulmonic valve was grossly normal. Pulmonic valve regurgitation is not visualized. Aorta: The aortic root and ascending aorta are structurally normal, with no evidence of dilitation. Venous: The inferior vena cava is normal in size with greater than 50% respiratory variability, suggesting right atrial pressure of 3 mmHg. IAS/Shunts: No atrial level shunt detected by color flow Doppler.  LEFT VENTRICLE PLAX 2D LVIDd:         4.70 cm  Diastology LVIDs:         2.80 cm  LV e' lateral:   10.00 cm/s LV PW:         0.80 cm  LV E/e' lateral: 13.1 LV IVS:        0.90 cm  LV e' medial:    4.81 cm/s LVOT diam:     1.90 cm  LV E/e' medial:  27.2 LV SV:         55 LV SV Index:   34 LVOT Area:     2.84 cm  RIGHT VENTRICLE RV S prime:     8.93 cm/s TAPSE (M-mode): 2.0 cm LEFT ATRIUM             Index       RIGHT ATRIUM           Index LA diam:        4.10 cm 2.50 cm/m  RA Area:     12.80 cm LA Vol (A2C):   38.5 ml 23.49 ml/m RA Volume:   27.30 ml  16.66 ml/m LA Vol (A4C):   59.1 ml 36.06 ml/m LA Biplane Vol: 50.0 ml 30.51 ml/m  AORTIC VALVE LVOT Vmax:   89.50 cm/s LVOT Vmean:  56.300 cm/s LVOT VTI:    0.194 m  AORTA Ao Root diam: 3.00 cm MITRAL VALVE                TRICUSPID VALVE MV Area (PHT): 3.85 cm  TR Peak grad:   25.2 mmHg MV Decel Time: 197 msec     TR Vmax:        251.00 cm/s MV E velocity: 131.00 cm/s MV A velocity: 29.40 cm/s   SHUNTS MV E/A ratio:  4.46         Systemic VTI:  0.19 m                             Systemic Diam: 1.90 cm Dani Gobble Croitoru MD  Electronically signed by Sanda Klein MD Signature Date/Time: 08/20/2019/10:57:28 AM    Final     Assessment/Plan 1. Atrial fibrillation with RVR ([place]) -converted on her own during the hospitalization  -discussion held in detail at hospital about risks outweighing benefits of NOAC or ASA for anticoagulation in view of her cirrhosis/portal htn/esophageal varices -pt would like to see Dr. Harrell Gave as her primary cardiologist going forward (advised to call the cardiology office and indicate that)  2. Other acute pericarditis -complete 90 day course of colchicine therapy per cardiology -suspect pain in lower left is also related to inflammation--may also had some costochondritis  3. Dysuria - POC Urinalysis Dipstick was positive - sent for urine culture  4. Controlled type 2 diabetes mellitus with hyperglycemia, with long-term current use of insulin (HCC) -for labs in June -sugars doing better and following healthier diet with guidance from son and daughter-in-law  33. Other cirrhosis of liver ([place]) -has upcoming MRI of abdomen to eval s/p TIPS and due to umbilical hernia by her report -advised to keep as scheduled  6. ACP (advance care planning) -discussed risks of not taking covid vaccine and she and her husband continue to elect not to take it -she was c/o all of the tests she has to do and meds she has to take  -we discussed that at times patients decide enough is enough with spending their time attending appts and going to the hospital over and over again when they get very sick and that it's ok to choose quality of life over quantity if she reaches that point -she and her husband became very tearful (he did first and that upset her) -she says she does want to continue treatments at this time -she will let me know if she changes her mind on this -Kayla Rinks said that he knows that they are in God's hands and will go to a better place in the end -they understand how ill she has been  (though she appears stable at this time) -will discuss code status at the next visit in June -18 mins spent on ACP today  -meds also reconciled   Labs/tests ordered:  Keep scheduled lab in June Next appt:  10/14/2019  Jasher Barkan L. Aalijah Mims, D.O. [facility] [address], [place] [address] Cell Phone (Mon-Fri 8am-5pm):  [phone] On Call:  [phone] & follow prompts after 5pm & weekends Office Phone:  [phone] Office Fax:  [phone]

## 2019-08-27 ENCOUNTER — Ambulatory Visit: Payer: Self-pay | Admitting: Surgery

## 2019-08-27 ENCOUNTER — Telehealth: Payer: Self-pay | Admitting: Nurse Practitioner

## 2019-08-27 DIAGNOSIS — I2510 Atherosclerotic heart disease of native coronary artery without angina pectoris: Secondary | ICD-10-CM | POA: Diagnosis not present

## 2019-08-27 DIAGNOSIS — R188 Other ascites: Secondary | ICD-10-CM | POA: Diagnosis not present

## 2019-08-27 DIAGNOSIS — K429 Umbilical hernia without obstruction or gangrene: Secondary | ICD-10-CM | POA: Diagnosis not present

## 2019-08-27 DIAGNOSIS — I4891 Unspecified atrial fibrillation: Secondary | ICD-10-CM | POA: Diagnosis not present

## 2019-08-27 DIAGNOSIS — K7460 Unspecified cirrhosis of liver: Secondary | ICD-10-CM | POA: Diagnosis not present

## 2019-08-27 NOTE — H&P (Signed)
History of Present Illness [name]. Maansi Wike MD; 08/27/2019 5:50 PM) The patient is a 74 year old female who presents with an umbilical hernia. Referred by Hollace Kinnier, DO for umbilical hernia  This is a 74 year old female with a very extensive past medical history of nonalcoholic cirrhosis (MELD 10) status post TIPS procedure, hypertension, hyperlipidemia, the M2, CAD status post CABG in 2017, bilateral carotid artery disease presents with an enlarging umbilical hernia. Her cirrhosis is managed by Dr. Oretha Caprice and is under reasonably good control. Her most recent liver function tests are slightly improved. One week ago, AST was 63, ALT 41, alkaline phosphatase 144, total bilirubin 2.2, direct 0.8, indirect 1.4. She states that the hernia has been present for several months but seems to be enlarging. A MRI of the abdomen is scheduled for next week to evaluate both her liver as well as the hernia. The patient states that the hernia is mildly uncomfortable and she is standing. It spontaneously reduces when she is supine. She has never had any inflammation or drainage from this area. She has not had any bleeding in this area.   Problem List/Past Medical Rodman Key K. Reiana Poteet, MD; 08/27/2019 5:50 PM) CORONARY ARTERY DISEASE (I25.10) CIRRHOSIS, NONALCOHOLIC (A83.41) ATRIAL FIBRILLATION (I48.91) ASCITES (D62.2) UMBILICAL HERNIA (W97.9)  Past Surgical History (April Staton, CMA; 08/27/2019 3:53 PM) Breast Biopsy Left. Coronary Artery Bypass Graft Hysterectomy (not due to cancer) - Partial Liver Surgery Spinal Surgery - Lower Back  Diagnostic Studies History (April Staton, CMA; 08/27/2019 3:53 PM) Colonoscopy 1-5 years ago Mammogram within last year Pap Smear >5 years ago  Allergies (April Staton, CMA; 08/27/2019 3:54 PM) Latex Statins Tramadol Nausea, Vomiting.  Medication History (April Staton, CMA; 08/27/2019 3:59 PM) Tylenol (500MG Capsule, Oral) Active. Amino  Acids-Protein Hydrolys (Oral) Active. Biotin (10000MCG Tablet, Oral) Active. Colchicine (0.6MG Tablet, Oral) Active. Vitamin B-12 (50MCG Tablet, Oral) Active. Ezetimibe (10MG Tablet, Oral) Active. Furosemide (40MG Tablet, Oral) Active. Insulin Syringe (31G X 5/16"1 ML Misc,) Active. Jardiance (10MG Tablet, Oral) Active. Lactulose (10GM/15ML Solution, Oral) Active. Claritin (10MG Capsule, Oral) Active. Magnesium (Oral) Specific strength unknown - Active. Metoprolol Tartrate (25MG Tablet, Oral) Active. Multi-Vitamin (Oral) Active. Nadolol (40MG Tablet, Oral) Active. Omeprazole (20MG Capsule DR, Oral) Active. Ondansetron HCl (4MG Tablet, Oral) Active. Pantoprazole Sodium (40MG Tablet DR, Oral) Active. rifAXIMin (550MG Tablet, Oral) Active. Medications Reconciled  Family History (April Staton, [place]; 08/27/2019 3:53 PM) Alcohol Abuse Son. Arthritis Daughter. Breast Cancer Mother. Heart disease in female family member before age 5  Pregnancy / Birth History (April Staton, [place]; 08/27/2019 3:53 PM) Age at menarche 48 years. Age of menopause 57-50 Gravida 9 Maternal age 36-25 Para 81  Other Problems [name]. Vaishali Baise, MD; 08/27/2019 5:50 PM) Arthritis Back Pain Cirrhosis Of Liver Diabetes Mellitus Gastroesophageal Reflux Disease Myocardial infarction Umbilical Hernia Repair     Review of Systems Rodman Key K. Domnic Vantol MD; 08/27/2019 5:53 PM) Skin Present- Non-Healing Wounds. Not Present- Change in Wart/Mole, Dryness, Hives, Jaundice, New Lesions, Rash and Ulcer. HEENT Present- Yellow Eyes. Not Present- Earache, Hearing Loss, Hoarseness, Nose Bleed, Oral Ulcers, Ringing in the Ears, Seasonal Allergies, Sinus Pain, Sore Throat, Visual Disturbances and Wears glasses/contact lenses. Cardiovascular Present- Leg Cramps and Swelling of Extremities. Not Present- Chest Pain, Difficulty Breathing Lying Down, Palpitations, Rapid Heart Rate and Shortness of  Breath. Gastrointestinal Present- Excessive gas. Not Present- Abdominal Pain, Bloating, Bloody Stool, Change in Bowel Habits, Chronic diarrhea, Constipation, Difficulty Swallowing, Gets full quickly at meals, Hemorrhoids, Indigestion, Nausea, Rectal Pain and Vomiting. Female  Genitourinary Present- Nocturia. Not Present- Frequency, Painful Urination, Pelvic Pain and Urgency. Musculoskeletal Present- Back Pain. Not Present- Joint Pain, Joint Stiffness, Muscle Pain, Muscle Weakness and Swelling of Extremities. Endocrine Present- Hair Changes. Not Present- Cold Intolerance, Excessive Hunger, Heat Intolerance, Hot flashes and New Diabetes.  Vitals (April Staton CMA; 08/27/2019 4:00 PM) 08/27/2019 3:59 PM Weight: 142 lb Height: 63in Body Surface Area: 1.67 m Body Mass Index: 25.15 kg/m  Temp.: 98.34F(Tympanic)  Pulse: 75 (Regular)  P.OX: 97% (Room air) BP: 100/60 (Sitting, Left Arm, Standard)        Physical Exam Rodman Key K. Danniela Mcbrearty MD; 08/27/2019 5:55 PM)  The physical exam findings are as follows: Note:Constitutional: WDWN in NAD, conversant, no obvious deformities; lying on bed comfortably Eyes: Pupils equal, round; sclera mild icterus; moist conjunctiva; no lid lag HENT: Oral mucosa moist; good dentition Neck: No masses palpated, trachea midline; no thyromegaly Lungs: CTA bilaterally; normal respiratory effort CV: Regular rate and rhythm; no murmurs; extremities well-perfused with 1+ edema Abd: +bowel sounds, soft, slight fluid wave; some scattered subcutaneous ecchymosis from injections. No sign of caput medusae. Moderate-sized fluid-filled umbilical hernia that spontaneously reduces when she is supine - fascial defect 1.5 cm. No skin changes at the umbilicus. Musc: Unable to assess gait; no apparent clubbing or cyanosis in extremities Lymphatic: No palpable cervical or axillary lymphadenopathy Skin: Warm, dry; mild jaundice Psychiatric - alert and oriented x 4;  calm mood and affect    Assessment & Plan Rodman Key K. Wynne Rozak MD; 08/27/2019 5:57 PM)  CIRRHOSIS, NONALCOHOLIC (E09.23)   CORONARY ARTERY DISEASE (I25.10)   ATRIAL FIBRILLATION (R00.76)   UMBILICAL HERNIA (A26.3)  Current Plans Schedule for Surgery - Umbilical hernia repair. The surgical procedure has been discussed with the patient. Potential risks, benefits, alternative treatments, and expected outcomes have been explained. All of the patient's questions at this time have been answered. The likelihood of reaching the patient's treatment goal is good. The patient understand the proposed surgical procedure and wishes to proceed.  ASCITES (R18.8)  Note:Although the patient has multiple comorbidities and is at elevated risk for perioperative complications, her hepatic function seems to be moderately well controlled at this time. As her cirrhosis worsens, her hernia will likely become larger and she runs the risk of developing urgent indications for surgery. I discussed this with the patient and her husband and explained that it might be better to operate now to electively repair the hernia rather than waiting for it to become an urgent issue. We will not use mesh as there is a higher likelihood of infection because of the recurrent ascites and recurrent paracenteses. We will primarily close this small defect. First we will obtain clearance from her cardiologist.  She will stay overnight after surgery.  [name]. Georgette Dover, MD, [facility]  General/ Trauma Surgery   08/27/2019 5:57 PM

## 2019-08-27 NOTE — Telephone Encounter (Signed)
PC to patient to inform her I would be calling in a prescription for Valium to her [place]. Pt instructed to take 1 41m valium an hour before her procedure and an additional [id number] later if needed. Pt also instructed to have a driver to take her to and from her appointment. Pt verbalized understanding.

## 2019-08-27 NOTE — H&P (View-Only) (Signed)
History of Present Illness [name]. Kayla Mariner MD; 08/27/2019 5:50 PM) The patient is a 74 year old female who presents with an umbilical hernia. Referred by Hollace Kinnier, DO for umbilical hernia  This is a 74 year old female with a very extensive past medical history of nonalcoholic cirrhosis (MELD 10) status post TIPS procedure, hypertension, hyperlipidemia, the M2, CAD status post CABG in 2017, bilateral carotid artery disease presents with an enlarging umbilical hernia. Her cirrhosis is managed by Dr. Oretha Caprice and is under reasonably good control. Her most recent liver function tests are slightly improved. One week ago, AST was 63, ALT 41, alkaline phosphatase 144, total bilirubin 2.2, direct 0.8, indirect 1.4. She states that the hernia has been present for several months but seems to be enlarging. A MRI of the abdomen is scheduled for next week to evaluate both her liver as well as the hernia. The patient states that the hernia is mildly uncomfortable and she is standing. It spontaneously reduces when she is supine. She has never had any inflammation or drainage from this area. She has not had any bleeding in this area.   Problem List/Past Medical Rodman Key K. Jonia Oakey, MD; 08/27/2019 5:50 PM) CORONARY ARTERY DISEASE (I25.10) CIRRHOSIS, NONALCOHOLIC (A12.87) ATRIAL FIBRILLATION (I48.91) ASCITES (O67.6) UMBILICAL HERNIA (H20.9)  Past Surgical History (April Staton, CMA; 08/27/2019 3:53 PM) Breast Biopsy Left. Coronary Artery Bypass Graft Hysterectomy (not due to cancer) - Partial Liver Surgery Spinal Surgery - Lower Back  Diagnostic Studies History (April Staton, CMA; 08/27/2019 3:53 PM) Colonoscopy 1-5 years ago Mammogram within last year Pap Smear >5 years ago  Allergies (April Staton, CMA; 08/27/2019 3:54 PM) Latex Statins Tramadol Nausea, Vomiting.  Medication History (April Staton, CMA; 08/27/2019 3:59 PM) Tylenol (500MG Capsule, Oral) Active. Amino  Acids-Protein Hydrolys (Oral) Active. Biotin (10000MCG Tablet, Oral) Active. Colchicine (0.6MG Tablet, Oral) Active. Vitamin B-12 (50MCG Tablet, Oral) Active. Ezetimibe (10MG Tablet, Oral) Active. Furosemide (40MG Tablet, Oral) Active. Insulin Syringe (31G X 5/16"1 ML Misc,) Active. Jardiance (10MG Tablet, Oral) Active. Lactulose (10GM/15ML Solution, Oral) Active. Claritin (10MG Capsule, Oral) Active. Magnesium (Oral) Specific strength unknown - Active. Metoprolol Tartrate (25MG Tablet, Oral) Active. Multi-Vitamin (Oral) Active. Nadolol (40MG Tablet, Oral) Active. Omeprazole (20MG Capsule DR, Oral) Active. Ondansetron HCl (4MG Tablet, Oral) Active. Pantoprazole Sodium (40MG Tablet DR, Oral) Active. rifAXIMin (550MG Tablet, Oral) Active. Medications Reconciled  Family History (April Staton, [place]; 08/27/2019 3:53 PM) Alcohol Abuse Son. Arthritis Daughter. Breast Cancer Mother. Heart disease in female family member before age 15  Pregnancy / Birth History (April Staton, [place]; 08/27/2019 3:53 PM) Age at menarche 33 years. Age of menopause 91-50 Gravida 83 Maternal age 58-25 Para 90  Other Problems [name]. Kayla Freeburg, MD; 08/27/2019 5:50 PM) Arthritis Back Pain Cirrhosis Of Liver Diabetes Mellitus Gastroesophageal Reflux Disease Myocardial infarction Umbilical Hernia Repair     Review of Systems Rodman Key K. Sumire Halbleib MD; 08/27/2019 5:53 PM) Skin Present- Non-Healing Wounds. Not Present- Change in Wart/Mole, Dryness, Hives, Jaundice, New Lesions, Rash and Ulcer. HEENT Present- Yellow Eyes. Not Present- Earache, Hearing Loss, Hoarseness, Nose Bleed, Oral Ulcers, Ringing in the Ears, Seasonal Allergies, Sinus Pain, Sore Throat, Visual Disturbances and Wears glasses/contact lenses. Cardiovascular Present- Leg Cramps and Swelling of Extremities. Not Present- Chest Pain, Difficulty Breathing Lying Down, Palpitations, Rapid Heart Rate and Shortness of  Breath. Gastrointestinal Present- Excessive gas. Not Present- Abdominal Pain, Bloating, Bloody Stool, Change in Bowel Habits, Chronic diarrhea, Constipation, Difficulty Swallowing, Gets full quickly at meals, Hemorrhoids, Indigestion, Nausea, Rectal Pain and Vomiting. Female  Genitourinary Present- Nocturia. Not Present- Frequency, Painful Urination, Pelvic Pain and Urgency. Musculoskeletal Present- Back Pain. Not Present- Joint Pain, Joint Stiffness, Muscle Pain, Muscle Weakness and Swelling of Extremities. Endocrine Present- Hair Changes. Not Present- Cold Intolerance, Excessive Hunger, Heat Intolerance, Hot flashes and New Diabetes.  Vitals (April Staton CMA; 08/27/2019 4:00 PM) 08/27/2019 3:59 PM Weight: 142 lb Height: 63in Body Surface Area: 1.67 m Body Mass Index: 25.15 kg/m  Temp.: 98.65F(Tympanic)  Pulse: 75 (Regular)  P.OX: 97% (Room air) BP: 100/60 (Sitting, Left Arm, Standard)        Physical Exam Rodman Key K. Leaha Cuervo MD; 08/27/2019 5:55 PM)  The physical exam findings are as follows: Note:Constitutional: WDWN in NAD, conversant, no obvious deformities; lying on bed comfortably Eyes: Pupils equal, round; sclera mild icterus; moist conjunctiva; no lid lag HENT: Oral mucosa moist; good dentition Neck: No masses palpated, trachea midline; no thyromegaly Lungs: CTA bilaterally; normal respiratory effort CV: Regular rate and rhythm; no murmurs; extremities well-perfused with 1+ edema Abd: +bowel sounds, soft, slight fluid wave; some scattered subcutaneous ecchymosis from injections. No sign of caput medusae. Moderate-sized fluid-filled umbilical hernia that spontaneously reduces when she is supine - fascial defect 1.5 cm. No skin changes at the umbilicus. Musc: Unable to assess gait; no apparent clubbing or cyanosis in extremities Lymphatic: No palpable cervical or axillary lymphadenopathy Skin: Warm, dry; mild jaundice Psychiatric - alert and oriented x 4;  calm mood and affect    Assessment & Plan Rodman Key K. Janiyah Beery MD; 08/27/2019 5:57 PM)  CIRRHOSIS, NONALCOHOLIC (X91.47)   CORONARY ARTERY DISEASE (I25.10)   ATRIAL FIBRILLATION (W29.56)   UMBILICAL HERNIA (O13.0)  Current Plans Schedule for Surgery - Umbilical hernia repair. The surgical procedure has been discussed with the patient. Potential risks, benefits, alternative treatments, and expected outcomes have been explained. All of the patient's questions at this time have been answered. The likelihood of reaching the patient's treatment goal is good. The patient understand the proposed surgical procedure and wishes to proceed.  ASCITES (R18.8)  Note:Although the patient has multiple comorbidities and is at elevated risk for perioperative complications, her hepatic function seems to be moderately well controlled at this time. As her cirrhosis worsens, her hernia will likely become larger and she runs the risk of developing urgent indications for surgery. I discussed this with the patient and her husband and explained that it might be better to operate now to electively repair the hernia rather than waiting for it to become an urgent issue. We will not use mesh as there is a higher likelihood of infection because of the recurrent ascites and recurrent paracenteses. We will primarily close this small defect. First we will obtain clearance from her cardiologist.  She will stay overnight after surgery.  [name]. Georgette Dover, MD, [facility]  General/ Trauma Surgery   08/27/2019 5:57 PM

## 2019-08-28 ENCOUNTER — Telehealth: Payer: Self-pay | Admitting: *Deleted

## 2019-08-28 LAB — URINE CULTURE
MICRO NUMBER:: 10380437
SPECIMEN QUALITY:: ADEQUATE

## 2019-08-28 NOTE — Telephone Encounter (Signed)
   Primary Cardiologist: [name], MD  Chart reviewed as part of pre-operative protocol coverage. She was seen on consultation by Dr.Christopher on 08/19/2019 for transient atrial fib which spontaneously converted to NSR. She was not place on anticoagulation at that time per Dr. Judeth Cornfield recommendation. She was treated for pericarditis.    Given past medical history and time since last visit, based on ACC/AHA guidelines, [name] would be at acceptable risk for the planned procedure without further cardiovascular testing.   I will route this recommendation to the requesting party via Epic fax function and remove from pre-op pool.  Please call with questions.  Phill Myron. Jairen Goldfarb DNP, ANP, AACC  08/28/2019, 11:20 AM

## 2019-08-28 NOTE — Telephone Encounter (Signed)
   [facility] Pre-operative Risk Assessment    Request for surgical clearance:  1. What type of surgery is being performed? UMBILICAL HERNIA REPAIR   2. When is this surgery scheduled? TBD   3. What type of clearance is required (medical clearance vs. Pharmacy clearance to hold med vs. Both)? MEDICAL  4. Are there any medications that need to be held prior to surgery and how long? NONE LISTED    5. Practice name and name of physician performing surgery? [facility]; DR. Rodman Key TSUEI   6. What is your office phone number [phone]    7.   What is your office fax number [phone] ATTN: Mammie Lorenzo, LPN  8.   Anesthesia type (None, local, MAC, general) ? GENERAL    Julaine Hua 08/28/2019, 11:02 AM  _________________________________________________________________   (provider comments below)

## 2019-08-29 ENCOUNTER — Telehealth: Payer: Self-pay

## 2019-08-29 MED ORDER — CIPROFLOXACIN HCL 500 MG PO TABS: 500.0000 mg | ORAL_TABLET | Freq: Two times a day (BID) | ORAL | 0 refills | Status: AC

## 2019-08-29 NOTE — Telephone Encounter (Signed)
Cipro 500 mg Bid x 7 days sent in per Dr. Mariea Clonts.

## 2019-09-01 NOTE — Progress Notes (Deleted)
Cardiology Office Note   Date:  09/01/2019   ID:  [name], [name] 1946/02/02, MRN [id number]  PCP:  Gayland Curry, DO  Cardiologist:  Dr. Johnsie Cancel  No chief complaint on file.     History of Present Illness: [name] is a 74 y.o. female who presents for hospital follow-up, seen for Dr. Johnsie Cancel.   [name] has a hx of HTN, HLD, DM2, CAD s/p CABG in 2017 EF 60-65% 02/2016, ascites/cirrhosis, B/L carotid artery disease who was recently hospitalized for chest pain and atrial fibrillation with RVR.   She had a STEMI in 2017 followed by CABG,LIMA-LAD, SVG- IM, SVG -PDA. EF at the time 55-60%. Patient had post-op ascites and cirrhosis. She is s/p multiple paracentesis and followed by GI. BB changed to nadolol. She tales spironolactone and lasix. Last saw Dr. Johnsie Cancel 3/2020and was stable from a cardiac standpoint.  The patient presented to the ED4/12/21forchest pain.Patient saidsymptoms started about 3 days prior to presentation while at rest. Chest pain was described as middle to right sided sharp in nature. It was worse with taking a deep breath. She had associated shortness of breath but no N/V or diaphoresis. Given her symptoms, she went to an urgent care and was found to have new onset A. fib and was sent to the ED for evaluation.She reported 2 months ago she had banding for her esophageal varices and since then has had intermittent swelling. She takes Lasix 20 mg daily. Spironolatone previously discontinued for worsening kidney function.  In the ED HStroponin24>27.BNP 264. Initial EKG with Afib RVR, rates in the 120s.In the ED patient spontaneous converted to sinus rhythm. Follow up EKG shows sinus rates in the 80s.CXR unremarkable.Patient was given IV Lasix and started on IV heparin.  Given GI hx, AC for AF was discussed at length. Note from Dr. [name] (her GI MD) on 06/18/19 noted that she is not on long term anticoagulation for her portal vein  thrombosis given large varices and prior bleeding from band ulcer site. She is now s/p TIPS.      Pleuritic chest pain:  -hsT were found to be negative, flat and more consistent with demand ischemia (likely from afib RVR than ACS).  -echocardiogram with normal EF and G2DD -CTA performed which showed no evidence of PE -CRP and Sed rates were elevated -With mildly elevated inflammatory markers, Dr. Harrell Gave dicussed treatment for pericarditis. Typical rx would be high dose NSAIDs or aspirin, but with her GI history and risk of bleeding, this is likely high risk for her. She agrees with this. With shared decision making, we decided to pursue colchicine treatment without NSAID/aspirin.   New onset atrial fibrillation -Presented with athree dayhx of day atypical/pleuritic chest pain with associatedSOB>>went to urgent care with EKG showing AF with RVR>>spontaneouslyconverted to sinus in the ED -Remains in SB with rates in the 50's which she states is at baseline for her -CHADSVASC =6(female, age, HTN, CAD, DM2, CHF) -CTA performed yesterday in the setting of inspiratory chest pain which was negative for PE therefore after long discussion between Dr. Harrell Gave and the patient, plan is likely to not pursue AC at this time in the setting of complicated GI hx with bleeding. -Echoperformed 08/20/19 with normal EF and G2DD -Lopressor 12.5 mg changed to PRN given low BP and bradycardia -She does not typically have palpitations, so she was instructed her to check her BP/HR when she feels off, and if heart rate >110 she can take the  PRN metoprolol.  Hx ofCAD s/p CABGx 3: -Chest painfelt to be in thesetting of the abovehowever continues to have chest pain with inspiration>>>CTAperformed which was found to be negaitve for PR -Sed rate and CRP both elevated therefore will likely need to treat for pericarditis>>>see plan above   Acute on chronic Chronic Diastolic HF: -BNP mildly up at  264. Patient also has h/o of cirrhosis with ascitics and esophageal varices. She has been having lower leg edema over the last 2 months worse in the last couple days -EF preserved in 2017>>>echo this admission with normal EF and G2DD -Treated with IV Lasix which was transitioned to PO Lasix 37m -Creatinineremainsat baseline -Weight, 132lb today  -I&O, net positive2969m HLD: -Last LDL, 61 this admission  -PTAZetia -No statin 2/2 to cirrhosis  HTN: -Low,101/46>97/47>90/49 -Transition to PO Lasix 4016mD -PRN lopressor   Heart murmur: -Echo in 2017 with mild AS and mild AI, mean gradient 12 mmHgecho as above -Echo 08/20/19 with normal EF and G2DD  DM2: -Restart PTA DM medications   Hypokalemia -Stabilized,3.9yesterday -Goal >4     Past Medical History:  Diagnosis Date  . Allergy   . Arthritis    neck  . Cataract    bilateral - MD monitoring cataracts  . CHF (congestive heart failure) ([place] . Chronic kidney disease, stage I    DR OTTELIN  HX UTIS  . Cirrhosis ([place] . Cramp of limb   . Diabetes mellitus   . Dysphagia, unspecified(787.20)   . Dysuria   . Epistaxis   . GERD (gastroesophageal reflux disease)   . Heart murmur    NO CARDIOLOGIST  DX FOR YEARS ASYMPTOMATIC  . Hepatic encephalopathy ([facility]CC[place] . Lumbago   . Neoplasm of uncertain behavior of skin   . Nonspecific elevation of levels of transaminase or lactic acid dehydrogenase (LDH)   . Osteoarthrosis, unspecified whether generalized or localized, unspecified site   . Other and unspecified hyperlipidemia    diet controlled  . Pain in joint, shoulder region   . Paresthesias 04/01/2015  . Postablative ovarian failure   . Trochanteric bursitis of left hip 12/15/2015  . Type 2 diabetes mellitus without complication ([place] . Unspecified essential hypertension    no meds    Past Surgical History:  Procedure Laterality Date  . BREAST BIOPSY    . CARDIAC CATHETERIZATION N/A 01/27/2016    Procedure: Left Heart Cath and Coronary Angiography;  Surgeon: HenBelva CromeD;  [facility];  Service: Cardiovascular;  Laterality: N/A;  . COLONOSCOPY  2012   Dr OrrLajoyce Corners . COLONOSCOPY WITH PROPOFOL N/A 07/07/2016   Procedure: COLONOSCOPY WITH PROPOFOL;  Surgeon: DanMilus BanisterD;  [facility];  Service: Endoscopy;  Laterality: N/A;  . CORONARY ARTERY BYPASS GRAFT N/A 01/28/2016   Procedure: CORONARY ARTERY BYPASS GRAFTING (CABG) x 3 USING RIGHT LEG GREATER SAPHENOUS VEIN GRAFT;  Surgeon: SteMelrose NakayamaD;  [facility];  Laterality: N/A;  . ENDOVEIN HARVEST OF GREATER SAPHENOUS VEIN Right 01/28/2016   Procedure: ENDOVEIN HARVEST OF GREATER SAPHENOUS VEIN;  Surgeon: SteMelrose NakayamaD;  [facility];  Laterality: Right;  . ESOPHAGEAL BANDING  03/28/2019   Procedure: ESOPHAGEAL BANDING;  Surgeon: JacMilus BanisterD;  [facility];  Service: Endoscopy;;  . ESOPHAGEAL BANDING  04/07/2019   Procedure: ESOPHAGEAL BANDING;  Surgeon: ManJuanita CraverD;  Location: [facility];  Service: Endoscopy;;  . ESOPHAGOGASTRODUODENOSCOPY N/A 04/07/2019   Procedure: ESOPHAGOGASTRODUODENOSCOPY (EGD);  Surgeon: Juanita Craver, MD;  [facility] ENDOS[facility];  Service: Endoscopy;  Laterality: N/A;  . ESOPHAGOGASTRODUODENOSCOPY (EGD) WITH PROPOFOL N/A 07/07/2016   Procedure: ESOPHAGOGASTRODUODENOSCOPY (EGD) WITH PROPOFOL;  Surgeon: [name], MD;  [facility];  Service: Endoscopy;  Laterality: N/A;  . ESOPHAGOGASTRODUODENOSCOPY (EGD) WITH PROPOFOL N/A 03/28/2019   Procedure: ESOPHAGOGASTRODUODENOSCOPY (EGD) WITH PROPOFOL;  Surgeon: [name], MD;  [facility];  Service: Endoscopy;  Laterality: N/A;  . HEMOSTASIS CLIP PLACEMENT  04/07/2019   Procedure: HEMOSTASIS CLIP PLACEMENT;  Surgeon: Juanita Craver, MD;  Location: [facility] ENDOSCOPY;  Service: Endoscopy;;  . IR ANGIOGRAM  SELECTIVE EACH ADDITIONAL VESSEL  04/08/2019  . IR EMBO ART  VEN HEMORR LYMPH EXTRAV  INC GUIDE ROADMAPPING  04/08/2019  . IR PARACENTESIS  04/08/2019  . IR RADIOLOGIST EVAL & MGMT  06/13/2019  . IR TIPS  04/08/2019  . MAXIMUM ACCESS (MAS)POSTERIOR LUMBAR INTERBODY FUSION (PLIF) 1 LEVEL Left 06/10/2015   Procedure: FOR MAXIMUM ACCESS (MAS) POSTERIOR LUMBAR INTERBODY FUSION (PLIF) LUMBAR THREE-FOUR EXTRAFORAMINAL MICRODISCECTOMY LUMBAR FIVE-SACRAL ONE LEFT;  Surgeon: Eustace Moore, MD;  Location: [facility]URO ORS;  Service: Neurosurgery;  Laterality: Left;  . RADIOLOGY WITH ANESTHESIA N/A 04/08/2019   Procedure: RADIOLOGY WITH ANESTHESIA;  Surgeon: Radiologist, Medication, MD;  Location: [facility];  Service: Radiology;  Laterality: N/A;  . SCLEROTHERAPY  04/07/2019   Procedure: SCLEROTHERAPY;  Surgeon: Juanita Craver, MD;  Location: [facility] ENDOSCOPY;  Service: Endoscopy;;  . TEE WITHOUT CARDIOVERSION N/A 01/28/2016   Procedure: TRANSESOPHAGEAL ECHOCARDIOGRAM (TEE);  Surgeon: Melrose Nakayama, MD;  Location: [place] [facility];  Service: Open Heart Surgery;  Laterality: N/A;  . TUBAL LIGATION  1982   Dr Connye Burkitt  . UPPER GASTROINTESTINAL ENDOSCOPY    . VAGINAL HYSTERECTOMY  1997   Dr Rande Lawman     Current Outpatient Medications  Medication Sig Dispense Refill  . acetaminophen (TYLENOL) 500 MG tablet Take 500 mg by mouth at bedtime.     . Amino Acids-Protein Hydrolys (FEEDING SUPPLEMENT, PRO-STAT SUGAR FREE 64,) LIQD Take 30 mLs by mouth 3 (three) times daily with meals.    . BD PEN NEEDLE NANO U/F 32G X 4 MM MISC USE THREE TIMES DAILY AS DIRECTED 100 each 6  . Biotin 10000 MCG TABS Take 10,000 mcg by mouth every morning.    . ciprofloxacin (CIPRO) 500 MG tablet Take 1 tablet (500 mg total) by mouth 2 (two) times daily for 7 days. 14 tablet 0  . colchicine 0.6 MG tablet Take 1 tablet (0.6 mg total) by mouth daily. 90 tablet 0  . Cyanocobalamin (VITAMIN B 12 PO) Take 1,000 mcg by mouth daily.      [name] Kitchen ezetimibe (ZETIA) 10 MG  tablet Take one tablet by mouth once daily 90 tablet 1  . furosemide (LASIX) 40 MG tablet Take 1 tablet (40 mg total) by mouth daily. 60 tablet 2  . glucose blood test strip One Touch Ultra II strips. Use to test blood sugar three times daily. Dx: E11.65 300 each 3  . insulin aspart (NOVOLOG FLEXPEN) 100 UNIT/ML FlexPen Inject 8-12 Units into the skin See admin instructions. 12 units at breakfast, 8 units at lunch and 12 at dinner     . insulin detemir (LEVEMIR FLEXTOUCH) 100 UNIT/ML FlexPen Inject 30 Units into the skin at bedtime.     . Insulin Syringe-Needle U-100 (INSULIN SYRINGE 1CC/31GX5/16") 31G X 5/16" 1 ML  MISC USE AS DIRECTED DAILY WITH LEVEMIR. Dx: E11.65 100 each 2  . JARDIANCE 10 MG TABS tablet TAKE 1 TABLET BY MOUTH DAILY 90 tablet 1  . JARDIANCE 25 MG TABS tablet TAKE 1 TABLET BY MOUTH DAILY 90 tablet 1  . lactulose (CHRONULAC) 10 GM/15ML solution Take by mouth 3 (three) times daily. Take 45 mg per day    . loratadine (CLARITIN) 10 MG tablet Take 10 mg by mouth daily as needed for allergies.    [name] Kitchen MAGNESIUM PO Take 500 mg by mouth daily.     . metoprolol tartrate (LOPRESSOR) 25 MG tablet Take 1 tablet (25 mg total) by mouth as needed (use as needed for HR greater than 110). 60 tablet 2  . Multiple Vitamins-Minerals (MULTIVITAMIN WITH MINERALS) tablet Take 1 tablet by mouth daily.      . nadolol (CORGARD) 40 MG tablet TAKE 1 TABLET(40 MG) BY MOUTH DAILY 90 tablet 1  . omeprazole (PRILOSEC) 20 MG capsule Take 20 mg by mouth daily.    . ondansetron (ZOFRAN) 4 MG tablet Take 1 tablet (4 mg total) by mouth every 6 (six) hours as needed for nausea. 20 tablet 0  . pantoprazole (PROTONIX) 40 MG tablet Take 40 mg by mouth 2 (two) times daily.    Vladimir Faster Glycol-Propyl Glycol (SYSTANE OP) Place 1 drop into both eyes daily.     . Probiotic Product (PROBIOTIC DAILY PO) Take 1 capsule by mouth daily. Digestive Advantage Probiotic    . rifaximin (XIFAXAN) 550 MG TABS tablet Take 1 tablet (550  mg total) by mouth 2 (two) times daily. 60 tablet 1   No current facility-administered medications for this visit.    Allergies:   Kiwi extract, Tdap [tetanus-diphth-acell pertussis], Statins, Latex, and Tramadol    Social History:  The patient  reports that she has never smoked. She has never used smokeless tobacco. She reports that she does not drink alcohol or use drugs.   Family History:  The patient's ***family history includes Arthritis in her brother and sister; Breast cancer in her maternal aunt, mother, and paternal aunt; Heart disease in her maternal grandfather, maternal grandmother, paternal grandfather, and paternal grandmother; Liver cancer in her brother; Lung cancer in her father; Other in her daughter.    ROS:  Please see the history of present illness.   Otherwise, review of systems are positive for {NONE DEFAULTED:18576::"none"}.   All other systems are reviewed and negative.    PHYSICAL EXAM: VS:  There were no vitals taken for this visit. , BMI There is no height or weight on file to calculate BMI. GEN: Well nourished, well developed, in no acute distress HEENT: normal Neck: no JVD, carotid bruits, or masses Cardiac: ***RRR; no murmurs, rubs, or gallops,no edema  Respiratory:  clear to auscultation bilaterally, normal work of breathing GI: soft, nontender, nondistended, + BS MS: no deformity or atrophy Skin: warm and dry, no rash Neuro:  Strength and sensation are intact Psych: euthymic mood, full affect   EKG:  EKG {ACTION; IS/IS VZC:[id number]} ordered today. The ekg ordered today demonstrates ***   Recent Labs: 08/19/2019: ALT 41; B Natriuretic Peptide 264.9; Magnesium 2.1; TSH 3.704 08/20/2019: BUN 19; Creatinine, Ser 1.02; Potassium 3.9; Sodium 139 08/21/2019: Hemoglobin 12.2; Platelets 110    Lipid Panel    Component Value Date/Time   CHOL 128 08/20/2019 0307   CHOL 185 07/08/2016 1045   TRIG 44 08/20/2019 0307   HDL 58 08/20/2019 0307   HDL 55  07/08/2016 1045   CHOLHDL 2.2 08/20/2019 0307   VLDL 9 08/20/2019 0307   LDLCALC 61 08/20/2019 0307   LDLCALC 80 05/24/2018 0857      Wt Readings from Last 3 Encounters:  08/26/19 136 lb 1.6 oz (61.7 kg)  08/21/19 132 lb 12.8 oz (60.2 kg)  08/02/19 143 lb (64.9 kg)      Other studies Reviewed: Additional studies/ records that were reviewed today include: ***. Review of the above records demonstrates: ***   ASSESSMENT AND PLAN:  1.  ***   Current medicines are reviewed at length with the patient today.  The patient {ACTIONS; HAS/DOES NOT HAVE:19233} concerns regarding medicines.  The following changes have been made:  {PLAN; NO CHANGE:13088:s}  Labs/ tests ordered today include: *** No orders of the defined types were placed in this encounter.    Disposition:   FU with *** in {gen number 2-13:086578} {Days to years:10300}  Signed, Kathyrn Drown, NP  09/01/2019 9:01 AM    [address], [place], [place]  [address] Phone: [phone]; Fax: [phone]

## 2019-09-05 ENCOUNTER — Other Ambulatory Visit: Payer: Self-pay | Admitting: Internal Medicine

## 2019-09-05 ENCOUNTER — Telehealth: Payer: Self-pay | Admitting: *Deleted

## 2019-09-05 ENCOUNTER — Telehealth: Payer: Self-pay | Admitting: Cardiology

## 2019-09-05 MED ORDER — DIAZEPAM 2 MG PO TABS: 2.0000 mg | ORAL_TABLET | Freq: Once | ORAL | 0 refills | Status: AC

## 2019-09-05 NOTE — Telephone Encounter (Signed)
New Message:    Pt called and said she will need her husband to come in with her for her appt on 09-10-19 with Kathyrn Drown. Pt says she can not hear well.

## 2019-09-05 NOTE — Telephone Encounter (Signed)
I just sent two valium tablets to [facility].  She can take one an hour before and repeat 20 mins later if she's still anxious/concerned about claustrophobia.  She should be very careful after taking it due to effects on balance.  Normally the person ordering a study will order the sedative for future reference.

## 2019-09-05 NOTE — Telephone Encounter (Signed)
I called and spoke with patient, she is aware that husband can accompany.

## 2019-09-05 NOTE — Telephone Encounter (Signed)
Patient called and stated that she has an MRI Scheduled for tomorrow:  ---- MRI of hernia and TIPS f/u on 4/30  Stated that she was to have Valium called into her pharmacy for #2 tablets. Pharmacy has not received this Rx yet per patient. Patient is requesting to be sent to pharmacy.   Please Advise.

## 2019-09-05 NOTE — Telephone Encounter (Signed)
Patient notified and agreed.  

## 2019-09-06 ENCOUNTER — Ambulatory Visit (HOSPITAL_COMMUNITY)
Admission: RE | Admit: 2019-09-06 | Discharge: 2019-09-06 | Disposition: A | Discharge status: Home / Self Care | Payer: Medicare Other | Source: Ambulatory Visit | Attending: Interventional Radiology | Admitting: Interventional Radiology

## 2019-09-06 ENCOUNTER — Other Ambulatory Visit: Payer: Self-pay

## 2019-09-06 ENCOUNTER — Other Ambulatory Visit (HOSPITAL_COMMUNITY)
Admission: RE | Admit: 2019-09-06 | Discharge: 2019-09-06 | Disposition: A | Discharge status: Home / Self Care | Payer: Medicare Other | Source: Ambulatory Visit | Attending: Surgery | Admitting: Surgery

## 2019-09-06 DIAGNOSIS — K8020 Calculus of gallbladder without cholecystitis without obstruction: Secondary | ICD-10-CM | POA: Insufficient documentation

## 2019-09-06 DIAGNOSIS — Z95828 Presence of other vascular implants and grafts: Secondary | ICD-10-CM

## 2019-09-06 DIAGNOSIS — Z01818 Encounter for other preprocedural examination: Secondary | ICD-10-CM | POA: Insufficient documentation

## 2019-09-06 DIAGNOSIS — Z20822 Contact with and (suspected) exposure to covid-19: Secondary | ICD-10-CM | POA: Diagnosis not present

## 2019-09-06 DIAGNOSIS — K7460 Unspecified cirrhosis of liver: Secondary | ICD-10-CM | POA: Diagnosis not present

## 2019-09-06 LAB — SARS CORONAVIRUS 2 (TAT 6-24 HRS): SARS Coronavirus 2: NEGATIVE

## 2019-09-06 MED ORDER — GADOBUTROL 1 MMOL/ML IV SOLN
6.0000 mL | Freq: Once | INTRAVENOUS | Status: AC | PRN
  Administered 2019-09-06: 6 mL via INTRAVENOUS

## 2019-09-09 ENCOUNTER — Ambulatory Visit: Payer: Medicare Other | Admitting: *Deleted

## 2019-09-09 ENCOUNTER — Other Ambulatory Visit: Payer: Self-pay

## 2019-09-09 ENCOUNTER — Encounter (HOSPITAL_COMMUNITY): Payer: Self-pay | Admitting: Surgery

## 2019-09-09 NOTE — Anesthesia Preprocedure Evaluation (Addendum)
Anesthesia Evaluation  Patient identified by MRN, date of birth, ID band Patient awake    Reviewed: Allergy & Precautions, NPO status , Patient's Chart, lab work & pertinent test results  Airway Mallampati: II  TM Distance: >3 FB Neck ROM: Full    Dental  (+) Teeth Intact, Dental Advisory Given   Pulmonary    breath sounds clear to auscultation       Cardiovascular hypertension,  Rhythm:Regular     Neuro/Psych    GI/Hepatic   Endo/Other  diabetes  Renal/GU      Musculoskeletal   Abdominal   Peds  Hematology   Anesthesia Other Findings   Reproductive/Obstetrics                             Anesthesia Physical Anesthesia Plan  ASA: III  Anesthesia Plan: General   Post-op Pain Management:    Induction: Intravenous  PONV Risk Score and Plan: Ondansetron and Dexamethasone  Airway Management Planned: Oral ETT  Additional Equipment:   Intra-op Plan:   Post-operative Plan: Extubation in OR  Informed Consent: I have reviewed the patients History and Physical, chart, labs and discussed the procedure including the risks, benefits and alternatives for the proposed anesthesia with the patient or authorized representative who has indicated his/her understanding and acceptance.     Dental advisory given  Plan Discussed with: CRNA and Anesthesiologist  Anesthesia Plan Comments: (See PAT note written 09/09/2019 by Myra Gianotti, PA-C. History includes cirrhosis (s/p TIPS 04/08/19), CAD (CABG 01/29/16), PAF (08/19/19), DM2.  )      Anesthesia Quick Evaluation

## 2019-09-09 NOTE — Progress Notes (Signed)
Patient denies shortness of breath, fever, cough and chest pain.  PCP - Hollace Kinnier, DO Cardiologist - Dr [name],  (Dr Harrell Gave saw pt in ED 08/19/19)  Gertie Fey - Dr Owens Loffler Urologist - Dr Karsten Ro  Chest x-ray - 08/19/19 (2V) EKG - 08/20/19 Stress Test - n/a ECHO - 08/20/19 Cardiac Cath - 01/27/16  Fasting Blood Sugar -  100s Checks Blood Sugar 1 time a day  . Do not take Jardiance the day before surgery (Mon) and the morning of surgery (Tues).  . THE NIGHT BEFORE SURGERY, take 15 units of Levemir insulin.   . THE MORNING OF SURGERY, do not take Novolag Insulin unless your CBG is greater than 220 mg/dL.  If greater than 220 mg/dl, you may take  of your sliding scale (correction) dose of insulin.  . If your blood sugar is less than 70 mg/dL, you will need to treat for low blood sugar: o Treat a low blood sugar (less than 70 mg/dL) with  cup of clear juice (cranberry or apple), 4 glucose tablets, OR glucose gel. o Recheck blood sugar in 15 minutes after treatment (to make sure it is greater than 70 mg/dL). If your blood sugar is not greater than 70 mg/dL on recheck, call [phone] for further instructions.  Anesthesia review: Yes  STOP now taking any Aspirin (unless otherwise instructed by your surgeon), Aleve, Naproxen, Ibuprofen, Motrin, Advil, Goody's, BC's, all herbal medications, fish oil, and all vitamins.   Coronavirus Screening Covid test on 09/06/19 was negative.  Patient verbalized understanding of instructions that were given via phone.

## 2019-09-09 NOTE — Progress Notes (Signed)
Anesthesia Chart Review: SAME DAY WORK-UP   Case: [id number] Date/Time: 09/10/19 1045   Procedure: HERNIA REPAIR UMBILICAL ADULT (N/A ) - LMA   Anesthesia type: General   Pre-op diagnosis: UMBILICAL HERNIA/CIRRHOSIS   Location: [facility] OR ROOM 07 / [facility]   Surgeons: Donnie Mesa, MD      DISCUSSION: Patient is a 74 year old female scheduled for the above procedure.  History includes never smoker, DM2, GERD, HLD, HTN, cirrhosis  (diagnosed 03/2016, due to NASH; non-occlusive portal vein thrombus 03/23/17 not treated with anticoagulation due to large varices; s/p banding of 14 varices 03/28/19; GIB s/p 2 clips for bleeding esophageal ulcer and s/p banding esophageal varix 04/07/19; s/p TIPS 04/08/19), chronic diastolic CHF, CAD (NSTEMI 01/27/16, s/p CABG: LIMA-LAD, SVG-RI, SVG-dRCA 01/29/16)), murmur (mild MR, mild-moderate TR, trivial AR 08/20/19 echo; very mild AS mean gradient 12, peak 24 03/08/16), hearing aids, epistaxis, L4-5 fusion 06/10/15, statin induced rhabdomyolysis (05/07/16), PAF (08/19/19).   Multiple Hospitalization since 03/2019 outlined below. - Hospitalization 08/19/19-08/21/19 for chest pain, acute on chronic diastolic CHF, with afib with RVR in the 120's. High sensitivity troponin24>27 andBNP 264, TSH WNL.  CRP minimally elevated at 1.1 (normal < 1.0). Sed rate elevated at 42 (normal range 0-22). She spontaneously converted with SR while still in the ED. Echoperformed 08/20/19 with normal EF and G2DD CHADSVASC =6(female, age, HTN, CAD, DM2, CHF). Given her liver disease a joint decision make not to start her on anticoagulation therapy. She was started on colchicine (no NSAIDS/ASA due to liver disease/GI bleed history) for pericarditis since her inflammatory markers were mildly elevated. Metoprolol PRN for tachycardia recommended.  - Hospitalization 05/26/19-06/01/19 for hepatic encephalopathy, hypercalcemia - Hospitalization 05/15/19-05/18/19 for generalized weakness, confusion due to hepatic  encephalopathy precipitated by UTI.  - Hospitalization 04/06/19-04/12/19 for upper GI bleed from esophageal varices, s/p banding and 2 clips for bleeding esophageal ulcer 04/07/19. She underwent TIPS with coil embolization of esophageal varices 04/08/19.   - Hospitalization 04/01/19-04/03/19 for dysphagia and odynophagia following 03/28/19 EGD with banding of 14 esophageal varices. Patient was started on a clear liquid diet, viscous lidocaine, Carafate and Protonix.  Patient symptoms improved and her diet was advanced   Preoperative cardiology input outlined by Jory Sims, NP on 08/28/19: "..She was seen on consultation by Dr.Christopher on 08/19/2019 for transient atrial fib which spontaneously converted to NSR. She was not place on anticoagulation at that time per Dr. Judeth Cornfield recommendation. She was treated for pericarditis.    Given past medical history and time since last visit, based on ACC/AHA guidelines, [name] would be at acceptable risk for the planned procedure without further cardiovascular testing."  According to H&P, Dr. Georgette Dover notes, "Although the patient has multiple comorbidities and is at elevated risk for perioperative complications, her hepatic function seems to be moderately well controlled at this time. As her cirrhosis worsens, her hernia will likely become larger and she runs the risk of developing urgent indications for surgery. I discussed this with the patient and her husband and explained that it might be better to operate now to electively repair the hernia rather than waiting for it to become an urgent issue..."  09/06/19 presurgical COVID-19 test negative. She is a same day work-up, so she is for labs and anesthesia team evaluation on the day of surgery.    VS: Ht 5' 3"  (1.6 m)   Wt 63.5 kg   LMP  (LMP Unknown)   BMI 24.80 kg/m   BP Readings from Last 3 Encounters:  08/26/19 110/62  08/21/19 (!) 95/51  08/02/19 120/60   Pulse Readings from  Last 3 Encounters:  08/26/19 78  08/21/19 (!) 56  08/02/19 70    PROVIDERS: Gayland Curry, DO is PCP. Last evaluation 08/26/19 for hospital follow-up.  [name], MD is cardiologist. Last evaluation by Buford Dresser, MD on 08/19/19 during hospitalization. She is wanting to switch to Dr. Harrell Gave.   Owens Loffler, MD is GI - Kathie Rhodes, MD is urologist - Arne Cleveland, MD is IR (for TIPS)   LABS: She is for labs on arrival. As of 08/21/19, most recent results include: Lab Results  Component Value Date   WBC 4.0 08/21/2019   HGB 12.2 08/21/2019   HCT 37.8 08/21/2019   PLT 110 (L) 08/21/2019   GLUCOSE 148 (H) 08/20/2019   CHOL 128 08/20/2019   TRIG 44 08/20/2019   HDL 58 08/20/2019   LDLCALC 61 08/20/2019   ALT 41 08/19/2019   AST 63 (H) 08/19/2019   NA 139 08/20/2019   K 3.9 08/20/2019   CL 106 08/20/2019   CREATININE 1.02 (H) 08/20/2019   BUN 19 08/20/2019   CO2 24 08/20/2019   TSH 3.704 08/19/2019   INR 1.1 08/19/2019   HGBA1C 6.2 (H) 08/20/2019     OTHER: IR TIPS 04/08/19: IMPRESSION: 1. Technically successful TIPS creation with decrease in mean portosystemic pressure from 15 to 4 mmHg. 2. Technically successful coil embolization of esophageal varices. 3. Ultrasound guided paracentesis removing 1.5 L clear yellow Ascites.  EGD 04/07/19: IMPRESSION: - Multiple esophageal ulcers-one was actively bleeding in the distal; injected with epinephrine and 2 clips (MR unsafe) were placed. - Esophageal varix at 35 cm-ulcer with stigmata of recent bleeding-banded x 1.. - Portal hypertensive gastropathy. - Normal duodenal bulb. - No specimens collected.    IMAGES: MRI Abd 09/06/19: IMPRESSION: 1. Decreased size of presumed post tips hematoma. Biloma is another differential consideration but it shows diminished size and no sign of enhancement. No suspicious hepatic lesion to suggest hepatocellular carcinoma in the setting of cirrhosis. 2.  Post tips procedure. Shunt patency not well assessed due to susceptibility artifact. The tips appears grossly patent particularly in the midportion where there is both flow related signal and contrast opacification. As it curves in plane at the cephalad and inferior margins susceptibility artifact and direction of flow makes assessment difficult. Still with abundant portosystemic collaterals in the upper abdomen and with changes of venous embolization in the hepatic gastric ligament. 3. Small cystic foci in the neck of the pancreas measuring 8 mm, largest 8 mm, grossly unchanged compared to the prior study. Main pancreatic duct is of upper limits of normal and there is no associated pancreatic ductal dilatation. These are nonspecific but could represent small side branch intraductal papillary mucinous neoplasms (IPMN). Recommend follow-up imaging in 2 years. This recommendation follows ACR consensus guidelines: Management of Incidental Pancreatic Cysts: A White Paper of the ACR Incidental Findings Committee. [place] 3614;43:[id number]. Areas were not well assessed on previous imaging studies due to edema. 4. Cholelithiasis without evidence of acute cholecystitis.   CTA Chest 07/20/19: IMPRESSION: 1. No definite evidence of pulmonary embolus. 2. Minimal bilateral posterior basilar subsegmental atelectasis is noted.   EKG: 08/19/19 14:51:27: Sinus rhythm changed from afib Otherwise no significant change Confirmed by Deno Etienne [phone]) on 08/19/2019 3:03:07 PM   CV: Echo 08/20/19: IMPRESSIONS  1. Left ventricular ejection fraction, by estimation, is 60 to 65%. The  left ventricle has normal  function. The left ventricle has no regional  wall motion abnormalities. Left ventricular diastolic parameters are  consistent with Grade II diastolic  dysfunction (pseudonormalization). Elevated left atrial pressure.  2. Right ventricular systolic function is normal. The right  ventricular  size is normal. There is normal pulmonary artery systolic pressure. The  estimated right ventricular systolic pressure is 51.7 mmHg.  3. Left atrial size was mildly dilated.  4. The mitral valve is normal in structure. Mild mitral valve  regurgitation.  5. Tricuspid valve regurgitation is mild to moderate.  6. The aortic valve is normal in structure. Aortic valve regurgitation is  trivial.  7. The inferior vena cava is normal in size with greater than 50%  respiratory variability, suggesting right atrial pressure of 3 mmHg.  - Comparison(s): Prior images unable to be directly viewed, comparison made  by report only.   Carotid US 01/27/16: Summary:  1. Findings suggest 1-39% internal carotid artery stenosis   bilaterally. Vertebral arteries are patent with antegrade flow.   Last cardiac cath was on 01/27/16 prior to CABG.    Past Medical History:  Diagnosis Date  . A-fib ([place])    08/19/19 afib with RVR, spontaneously converted to SR  . Allergy   . Arthritis    neck  . Cataract    bilateral - MD monitoring cataracts  . CHF (congestive heart failure) ([place])   . Chronic kidney disease, stage I    DR OTTELIN  HX UTIS  . Cirrhosis ([place])   . Coronary artery disease    s/p CABG 01/29/16: LIMA-LAD, SVG-RI, SVG-dRCA  . Cramp of limb   . Diabetes mellitus   . Dysphagia, unspecified(787.20)   . Dysuria   . Epistaxis   . GERD (gastroesophageal reflux disease)   . Hearing loss    wears hearing aids  . Heart murmur     DX FOR YEARS ASYMPTOMATIC  . Hepatic encephalopathy ([place])   . Lumbago   . Myocardial infarction ([place])    NSTEMI 01/27/16  . Neoplasm of uncertain behavior of skin   . Nonspecific elevation of levels of transaminase or lactic acid dehydrogenase (LDH)   . Osteoarthrosis, unspecified whether generalized or localized, unspecified site   . Other and unspecified hyperlipidemia    diet controlled  . Pain in joint, shoulder region   . Paresthesias  04/01/2015  . Postablative ovarian failure   . Trochanteric bursitis of left hip 12/15/2015  . Type 2 diabetes mellitus without complication ([place])   . Unspecified essential hypertension    no meds  . Wears glasses     Past Surgical History:  Procedure Laterality Date  . BACK SURGERY  06/2015  . BREAST BIOPSY Left   . CARDIAC CATHETERIZATION N/A 01/27/2016   Procedure: Left Heart Cath and Coronary Angiography;  Surgeon: Belva Crome, MD;  Location: [facility];  Service: Cardiovascular;  Laterality: N/A;  . COLONOSCOPY  2012   Dr Lajoyce Corners.   . COLONOSCOPY WITH PROPOFOL N/A 07/07/2016   Procedure: COLONOSCOPY WITH PROPOFOL;  Surgeon: [name], MD;  Location: [facility];  Service: Endoscopy;  Laterality: N/A;  . CORONARY ARTERY BYPASS GRAFT N/A 01/28/2016   Procedure: CORONARY ARTERY BYPASS GRAFTING (CABG) x 3 USING RIGHT LEG GREATER SAPHENOUS VEIN GRAFT;  Surgeon: Melrose Nakayama, MD;  Location: [facility];  Service: Open Heart Surgery;  Laterality: N/A;  . ENDOVEIN HARVEST OF GREATER SAPHENOUS VEIN Right 01/28/2016   Procedure: ENDOVEIN HARVEST OF GREATER SAPHENOUS VEIN;  Surgeon:  Melrose Nakayama, MD;  Location: [facility];  Service: Open Heart Surgery;  Laterality: Right;  . ESOPHAGEAL BANDING  03/28/2019   Procedure: ESOPHAGEAL BANDING;  Surgeon: [name], MD;  Location: [facility];  Service: Endoscopy;;  . ESOPHAGEAL BANDING  04/07/2019   Procedure: ESOPHAGEAL BANDING;  Surgeon: Juanita Craver, MD;  Location: [facility] ENDOSCOPY;  Service: Endoscopy;;  . ESOPHAGOGASTRODUODENOSCOPY N/A 04/07/2019   Procedure: ESOPHAGOGASTRODUODENOSCOPY (EGD);  Surgeon: Juanita Craver, MD;  Location[facility] ENDOSCOPY;  Service: Endoscopy;  Laterality: N/A;  . ESOPHAGOGASTRODUODENOSCOPY (EGD) WITH PROPOFOL N/A 07/07/2016   Procedure: ESOPHAGOGASTRODUODENOSCOPY (EGD) WITH PROPOFOL;  Surgeon: [name], MD;  [facility];  Service: Endoscopy;  Laterality: N/A;  . ESOPHAGOGASTRODUODENOSCOPY  (EGD) WITH PROPOFOL N/A 03/28/2019   Procedure: ESOPHAGOGASTRODUODENOSCOPY (EGD) WITH PROPOFOL;  Surgeon: [name], MD;  Location: [facility];  Service: Endoscopy;  Laterality: N/A;  . HEMOSTASIS CLIP PLACEMENT  04/07/2019   Procedure: HEMOSTASIS CLIP PLACEMENT;  Surgeon: Juanita Craver, MD;  Location: [place] [facility] ENDOSCOPY;  Service: Endoscopy;;  . IR ANGIOGRAM SELECTIVE EACH ADDITIONAL VESSEL  04/08/2019  . IR EMBO ART  VEN HEMORR LYMPH EXTRAV  INC GUIDE ROADMAPPING  04/08/2019  . IR PARACENTESIS  04/08/2019  . IR RADIOLOGIST EVAL & MGMT  06/13/2019  . IR TIPS  04/08/2019  . MAXIMUM ACCESS (MAS)POSTERIOR LUMBAR INTERBODY FUSION (PLIF) 1 LEVEL Left 06/10/2015   Procedure: FOR MAXIMUM ACCESS (MAS) POSTERIOR LUMBAR INTERBODY FUSION (PLIF) LUMBAR THREE-FOUR EXTRAFORAMINAL MICRODISCECTOMY LUMBAR FIVE-SACRAL ONE LEFT;  Surgeon: Eustace Moore, MD;  Location: [facility]O ORS;  Service: Neurosurgery;  Laterality: Left;  . RADIOLOGY WITH ANESTHESIA N/A 04/08/2019   Procedure: RADIOLOGY WITH ANESTHESIA;  Surgeon: Radiologist, Medication, MD;  Location: [facility];  Service: Radiology;  Laterality: N/A;  . SCLEROTHERAPY  04/07/2019   Procedure: SCLEROTHERAPY;  Surgeon: Juanita Craver, MD;  Location: [facility] ENDOSCOPY;  Service: Endoscopy;;  . TEE WITHOUT CARDIOVERSION N/A 01/28/2016   Procedure: TRANSESOPHAGEAL ECHOCARDIOGRAM (TEE);  Surgeon: Melrose Nakayama, MD;  Location: [place];  Service: Open Heart Surgery;  Laterality: N/A;  . TUBAL LIGATION  1982   Dr Connye Burkitt  . UPPER GASTROINTESTINAL ENDOSCOPY    . VAGINAL HYSTERECTOMY  1997   Dr Rande Lawman    MEDICATIONS: No current facility-administered medications for this encounter.   [name] Kitchen acetaminophen (TYLENOL) 500 MG tablet  . Amino Acids-Protein Hydrolys (FEEDING SUPPLEMENT, PRO-STAT SUGAR FREE 64,) LIQD  . BD PEN NEEDLE NANO U/F 32G X 4 MM MISC  . Biotin 10000 MCG TABS  . colchicine 0.6 MG tablet  . Cyanocobalamin (VITAMIN B 12 PO)  . ezetimibe (ZETIA) 10 MG tablet  .  furosemide (LASIX) 40 MG tablet  . glucose blood test strip  . insulin aspart (NOVOLOG FLEXPEN) 100 UNIT/ML FlexPen  . insulin detemir (LEVEMIR FLEXTOUCH) 100 UNIT/ML FlexPen  . Insulin Syringe-Needle U-100 (INSULIN SYRINGE 1CC/31GX5/16") 31G X 5/16" 1 ML MISC  . JARDIANCE 10 MG TABS tablet  . JARDIANCE 25 MG TABS tablet  . lactulose (CHRONULAC) 10 GM/15ML solution  . loratadine (CLARITIN) 10 MG tablet  . MAGNESIUM PO  . metoprolol tartrate (LOPRESSOR) 25 MG tablet  . Multiple Vitamins-Minerals (MULTIVITAMIN WITH MINERALS) tablet  . nadolol (CORGARD) 40 MG tablet  . omeprazole (PRILOSEC) 20 MG capsule  . ondansetron (ZOFRAN) 4 MG tablet  . pantoprazole (PROTONIX) 40 MG tablet  . Polyethyl Glycol-Propyl Glycol (SYSTANE OP)  . Probiotic Product (PROBIOTIC DAILY PO)  . rifaximin (XIFAXAN) 550 MG TABS tablet     Myra Gianotti, PA-C  Surgical Short Stay/Anesthesiology [facility] Phone [phone] [facility] Phone [phone] 09/09/2019 12:51 PM

## 2019-09-10 ENCOUNTER — Other Ambulatory Visit: Payer: Self-pay

## 2019-09-10 ENCOUNTER — Ambulatory Visit (HOSPITAL_COMMUNITY)
Admission: RE | Admit: 2019-09-10 | Discharge: 2019-09-10 | Disposition: A | Discharge status: Home / Self Care | Payer: Medicare Other | Attending: Surgery | Admitting: Surgery

## 2019-09-10 ENCOUNTER — Encounter (HOSPITAL_COMMUNITY)
Admission: RE | Disposition: A | Discharge status: Home / Self Care | Payer: Self-pay | Source: Home / Self Care | Attending: Surgery

## 2019-09-10 ENCOUNTER — Ambulatory Visit (HOSPITAL_COMMUNITY): Payer: Medicare Other | Admitting: Vascular Surgery

## 2019-09-10 ENCOUNTER — Ambulatory Visit: Payer: Medicare Other | Admitting: Cardiology

## 2019-09-10 ENCOUNTER — Encounter (HOSPITAL_COMMUNITY): Payer: Self-pay | Admitting: Surgery

## 2019-09-10 ENCOUNTER — Ambulatory Visit
Admission: RE | Admit: 2019-09-10 | Discharge: 2019-09-10 | Disposition: A | Discharge status: Home / Self Care | Payer: Medicare Other | Source: Ambulatory Visit | Attending: Interventional Radiology | Admitting: Interventional Radiology

## 2019-09-10 DIAGNOSIS — E1122 Type 2 diabetes mellitus with diabetic chronic kidney disease: Secondary | ICD-10-CM | POA: Diagnosis not present

## 2019-09-10 DIAGNOSIS — Z79899 Other long term (current) drug therapy: Secondary | ICD-10-CM | POA: Diagnosis not present

## 2019-09-10 DIAGNOSIS — I252 Old myocardial infarction: Secondary | ICD-10-CM | POA: Insufficient documentation

## 2019-09-10 DIAGNOSIS — I5032 Chronic diastolic (congestive) heart failure: Secondary | ICD-10-CM | POA: Diagnosis not present

## 2019-09-10 DIAGNOSIS — K7460 Unspecified cirrhosis of liver: Secondary | ICD-10-CM | POA: Diagnosis not present

## 2019-09-10 DIAGNOSIS — Z95828 Presence of other vascular implants and grafts: Secondary | ICD-10-CM

## 2019-09-10 DIAGNOSIS — Z794 Long term (current) use of insulin: Secondary | ICD-10-CM | POA: Diagnosis not present

## 2019-09-10 DIAGNOSIS — K429 Umbilical hernia without obstruction or gangrene: Secondary | ICD-10-CM | POA: Diagnosis not present

## 2019-09-10 DIAGNOSIS — I130 Hypertensive heart and chronic kidney disease with heart failure and stage 1 through stage 4 chronic kidney disease, or unspecified chronic kidney disease: Secondary | ICD-10-CM | POA: Insufficient documentation

## 2019-09-10 DIAGNOSIS — Z951 Presence of aortocoronary bypass graft: Secondary | ICD-10-CM | POA: Insufficient documentation

## 2019-09-10 DIAGNOSIS — I493 Ventricular premature depolarization: Secondary | ICD-10-CM | POA: Diagnosis not present

## 2019-09-10 DIAGNOSIS — E7849 Other hyperlipidemia: Secondary | ICD-10-CM | POA: Diagnosis not present

## 2019-09-10 DIAGNOSIS — I2510 Atherosclerotic heart disease of native coronary artery without angina pectoris: Secondary | ICD-10-CM | POA: Diagnosis not present

## 2019-09-10 DIAGNOSIS — I4891 Unspecified atrial fibrillation: Secondary | ICD-10-CM | POA: Diagnosis not present

## 2019-09-10 DIAGNOSIS — N181 Chronic kidney disease, stage 1: Secondary | ICD-10-CM | POA: Insufficient documentation

## 2019-09-10 DIAGNOSIS — K219 Gastro-esophageal reflux disease without esophagitis: Secondary | ICD-10-CM | POA: Diagnosis not present

## 2019-09-10 DIAGNOSIS — R188 Other ascites: Secondary | ICD-10-CM | POA: Diagnosis not present

## 2019-09-10 HISTORY — DX: Presence of spectacles and contact lenses: Z97.3

## 2019-09-10 HISTORY — DX: Acute myocardial infarction, unspecified: I21.9

## 2019-09-10 HISTORY — DX: Atherosclerotic heart disease of native coronary artery without angina pectoris: I25.10

## 2019-09-10 HISTORY — PX: UMBILICAL HERNIA REPAIR: SHX196

## 2019-09-10 HISTORY — DX: Unspecified atrial fibrillation: I48.91

## 2019-09-10 HISTORY — DX: Unspecified hearing loss, unspecified ear: H91.90

## 2019-09-10 LAB — COMPREHENSIVE METABOLIC PANEL
ALT: 51 U/L — ABNORMAL HIGH (ref 0–44)
AST: 79 U/L — ABNORMAL HIGH (ref 15–41)
Albumin: 2.6 g/dL — ABNORMAL LOW (ref 3.5–5.0)
Alkaline Phosphatase: 141 U/L — ABNORMAL HIGH (ref 38–126)
Anion gap: 10 (ref 5–15)
BUN: 11 mg/dL (ref 8–23)
CO2: 22 mmol/L (ref 22–32)
Calcium: 9.3 mg/dL (ref 8.9–10.3)
Chloride: 108 mmol/L (ref 98–111)
Creatinine, Ser: 1 mg/dL (ref 0.44–1.00)
GFR calc Af Amer: >60 mL/min (ref >60)
GFR calc non Af Amer: 56 mL/min — ABNORMAL LOW (ref >60)
Glucose, Bld: 128 mg/dL — ABNORMAL HIGH (ref 70–99)
Potassium: 3.7 mmol/L (ref 3.5–5.1)
Sodium: 140 mmol/L (ref 135–145)
Total Bilirubin: 1.7 mg/dL — ABNORMAL HIGH (ref 0.3–1.2)
Total Protein: 6.3 g/dL — ABNORMAL LOW (ref 6.5–8.1)

## 2019-09-10 LAB — CBC
HCT: 40.9 % (ref 36.0–46.0)
Hemoglobin: 12.7 g/dL (ref 12.0–15.0)
MCH: 30 pg (ref 26.0–34.0)
MCHC: 31.1 g/dL (ref 30.0–36.0)
MCV: 96.5 fL (ref 80.0–100.0)
Platelets: 91 10*3/uL — ABNORMAL LOW (ref 150–400)
RBC: 4.24 MIL/uL (ref 3.87–5.11)
RDW: 14.5 % (ref 11.5–15.5)
WBC: 4 10*3/uL (ref 4.0–10.5)
nRBC: 0 % (ref 0.0–0.2)

## 2019-09-10 LAB — GLUCOSE, CAPILLARY
Glucose-Capillary: 138 mg/dL — ABNORMAL HIGH (ref 70–99)
Glucose-Capillary: 119 mg/dL — ABNORMAL HIGH (ref 70–99)
Glucose-Capillary: 117 mg/dL — ABNORMAL HIGH (ref 70–99)

## 2019-09-10 SURGERY — REPAIR, HERNIA, UMBILICAL, ADULT
Anesthesia: General | Site: Abdomen

## 2019-09-10 MED ORDER — FENTANYL CITRATE (PF) 100 MCG/2ML IJ SOLN
INTRAMUSCULAR | Status: DC | PRN
  Administered 2019-09-10: 50 ug via INTRAVENOUS

## 2019-09-10 MED ORDER — LACTATED RINGERS IV SOLN: INTRAVENOUS | Status: DC

## 2019-09-10 MED ORDER — ONDANSETRON HCL 4 MG/2ML IJ SOLN
INTRAMUSCULAR | Status: AC
  Filled 2019-09-10: qty 2

## 2019-09-10 MED ORDER — LIDOCAINE 2% (20 MG/ML) 5 ML SYRINGE
INTRAMUSCULAR | Status: DC | PRN
  Administered 2019-09-10: 30 mg via INTRAVENOUS

## 2019-09-10 MED ORDER — LIDOCAINE 2% (20 MG/ML) 5 ML SYRINGE
INTRAMUSCULAR | Status: AC
  Filled 2019-09-10: qty 5

## 2019-09-10 MED ORDER — FENTANYL CITRATE (PF) 100 MCG/2ML IJ SOLN
25.0000 ug | INTRAMUSCULAR | Status: DC | PRN
  Administered 2019-09-10: 25 ug via INTRAVENOUS

## 2019-09-10 MED ORDER — 0.9 % SODIUM CHLORIDE (POUR BTL) OPTIME
TOPICAL | Status: DC | PRN
  Administered 2019-09-10: 1000 mL

## 2019-09-10 MED ORDER — ROCURONIUM BROMIDE 10 MG/ML (PF) SYRINGE
PREFILLED_SYRINGE | INTRAVENOUS | Status: AC
  Filled 2019-09-10: qty 10

## 2019-09-10 MED ORDER — FENTANYL CITRATE (PF) 250 MCG/5ML IJ SOLN
INTRAMUSCULAR | Status: AC
  Filled 2019-09-10: qty 5

## 2019-09-10 MED ORDER — ONDANSETRON HCL 4 MG/2ML IJ SOLN: 4.0000 mg | Freq: Once | INTRAMUSCULAR | Status: DC | PRN

## 2019-09-10 MED ORDER — CHLORHEXIDINE GLUCONATE CLOTH 2 % EX PADS: 6.0000 | MEDICATED_PAD | Freq: Once | CUTANEOUS | Status: DC

## 2019-09-10 MED ORDER — ROCURONIUM BROMIDE 10 MG/ML (PF) SYRINGE
PREFILLED_SYRINGE | INTRAVENOUS | Status: DC | PRN
  Administered 2019-09-10: 50 mg via INTRAVENOUS

## 2019-09-10 MED ORDER — DEXAMETHASONE SODIUM PHOSPHATE 10 MG/ML IJ SOLN
INTRAMUSCULAR | Status: DC | PRN
  Administered 2019-09-10: 10 mg via INTRAVENOUS

## 2019-09-10 MED ORDER — BUPIVACAINE HCL (PF) 0.25 % IJ SOLN
INTRAMUSCULAR | Status: AC
  Filled 2019-09-10: qty 30

## 2019-09-10 MED ORDER — LACTATED RINGERS IV SOLN: INTRAVENOUS | Status: DC | PRN

## 2019-09-10 MED ORDER — DEXAMETHASONE SODIUM PHOSPHATE 10 MG/ML IJ SOLN
INTRAMUSCULAR | Status: AC
  Filled 2019-09-10: qty 1

## 2019-09-10 MED ORDER — PHENYLEPHRINE 40 MCG/ML (10ML) SYRINGE FOR IV PUSH (FOR BLOOD PRESSURE SUPPORT)
PREFILLED_SYRINGE | INTRAVENOUS | Status: AC
  Filled 2019-09-10: qty 10

## 2019-09-10 MED ORDER — CEFAZOLIN SODIUM-DEXTROSE 2-4 GM/100ML-% IV SOLN
2.0000 g | INTRAVENOUS | Status: AC
  Administered 2019-09-10: 2 g via INTRAVENOUS
  Filled 2019-09-10: qty 100

## 2019-09-10 MED ORDER — FENTANYL CITRATE (PF) 100 MCG/2ML IJ SOLN
INTRAMUSCULAR | Status: AC
  Filled 2019-09-10: qty 2

## 2019-09-10 MED ORDER — SUGAMMADEX SODIUM 200 MG/2ML IV SOLN
INTRAVENOUS | Status: DC | PRN
  Administered 2019-09-10: 200 mg via INTRAVENOUS

## 2019-09-10 MED ORDER — PROPOFOL 10 MG/ML IV BOLUS
INTRAVENOUS | Status: DC | PRN
  Administered 2019-09-10: 140 mg via INTRAVENOUS

## 2019-09-10 MED ORDER — HYDROCODONE-ACETAMINOPHEN 5-325 MG PO TABS
1.0000 | ORAL_TABLET | Freq: Four times a day (QID) | ORAL | 0 refills | Status: DC | PRN
Start: 2019-09-10 — End: 2019-11-08

## 2019-09-10 MED ORDER — ONDANSETRON HCL 4 MG/2ML IJ SOLN
INTRAMUSCULAR | Status: DC | PRN
  Administered 2019-09-10: 4 mg via INTRAVENOUS

## 2019-09-10 MED ORDER — BUPIVACAINE HCL (PF) 0.25 % IJ SOLN
INTRAMUSCULAR | Status: DC | PRN
  Administered 2019-09-10: 10 mL

## 2019-09-10 MED ORDER — FENTANYL CITRATE (PF) 100 MCG/2ML IJ SOLN
25.0000 ug | INTRAMUSCULAR | Status: DC | PRN
Start: 2019-09-10 — End: 2019-09-10

## 2019-09-10 SURGICAL SUPPLY — 36 items
BENZOIN TINCTURE PRP APPL 2/3 (GAUZE/BANDAGES/DRESSINGS) ×2 IMPLANT
BLADE CLIPPER SURG (BLADE) IMPLANT
CANISTER SUCT 3000ML PPV (MISCELLANEOUS) IMPLANT
CHLORAPREP W/TINT 26 (MISCELLANEOUS) ×2 IMPLANT
CLSR STERI-STRIP ANTIMIC 1/2X4 (GAUZE/BANDAGES/DRESSINGS) ×2 IMPLANT
COVER SURGICAL LIGHT HANDLE (MISCELLANEOUS) ×2 IMPLANT
COVER WAND RF STERILE (DRAPES) IMPLANT
DRAPE LAPAROTOMY 100X72 PEDS (DRAPES) ×2 IMPLANT
DRSG TEGADERM 4X4.75 (GAUZE/BANDAGES/DRESSINGS) ×2 IMPLANT
ELECT CAUTERY BLADE 6.4 (BLADE) IMPLANT
ELECT REM PT RETURN 9FT ADLT (ELECTROSURGICAL) ×2
ELECTRODE REM PT RTRN 9FT ADLT (ELECTROSURGICAL) ×1 IMPLANT
GAUZE 4X4 16PLY RFD (DISPOSABLE) ×2 IMPLANT
GAUZE SPONGE 2X2 8PLY STRL LF (GAUZE/BANDAGES/DRESSINGS) ×1 IMPLANT
GLOVE BIO SURGEON STRL SZ7 (GLOVE) IMPLANT
GLOVE BIOGEL PI IND STRL 7.5 (GLOVE) ×1 IMPLANT
GLOVE BIOGEL PI INDICATOR 7.5 (GLOVE) ×1
GOWN STRL REUS W/ TWL LRG LVL3 (GOWN DISPOSABLE) ×2 IMPLANT
GOWN STRL REUS W/TWL LRG LVL3 (GOWN DISPOSABLE) ×4
KIT BASIN OR (CUSTOM PROCEDURE TRAY) ×2 IMPLANT
KIT TURNOVER KIT B (KITS) ×2 IMPLANT
NEEDLE HYPO 25GX1X1/2 BEV (NEEDLE) IMPLANT
NS IRRIG 1000ML POUR BTL (IV SOLUTION) ×2 IMPLANT
PACK GENERAL/GYN (CUSTOM PROCEDURE TRAY) ×2 IMPLANT
PAD ARMBOARD 7.5X6 YLW CONV (MISCELLANEOUS) ×2 IMPLANT
PENCIL SMOKE EVACUATOR (MISCELLANEOUS) ×2 IMPLANT
SPONGE GAUZE 2X2 STER 10/PKG (GAUZE/BANDAGES/DRESSINGS) ×1
STRIP CLOSURE SKIN 1/2X4 (GAUZE/BANDAGES/DRESSINGS) ×2 IMPLANT
SUT MNCRL AB 4-0 PS2 18 (SUTURE) ×2 IMPLANT
SUT NOVA NAB DX-16 0-1 5-0 T12 (SUTURE) ×4 IMPLANT
SUT NOVA NAB GS-21 0 18 T12 DT (SUTURE) ×2 IMPLANT
SUT VIC AB 3-0 SH 27 (SUTURE) ×2
SUT VIC AB 3-0 SH 27X BRD (SUTURE) ×1 IMPLANT
SYR CONTROL 10ML LL (SYRINGE) ×2 IMPLANT
TOWEL GREEN STERILE (TOWEL DISPOSABLE) ×2 IMPLANT
TOWEL GREEN STERILE FF (TOWEL DISPOSABLE) ×2 IMPLANT

## 2019-09-10 NOTE — Op Note (Signed)
Indications:   This is a 74 year old female with a very extensive past medical history of nonalcoholic cirrhosis (MELD 10) status post TIPS procedure, hypertension, hyperlipidemia, the M2, CAD status post CABG in 2017, bilateral carotid artery disease presents with an enlarging umbilical hernia. Her cirrhosis is managed by Dr. Oretha Caprice and is under reasonably good control. Her most recent liver function tests are slightly improved. One week ago, AST was 63, ALT 41, alkaline phosphatase 144, total bilirubin 2.2, direct 0.8, indirect 1.4. She states that the hernia has been present for several months but seems to be enlarging. A MRI of the abdomen is scheduled for next week to evaluate both her liver as well as the hernia. The patient states that the hernia is mildly uncomfortable and she is standing. It spontaneously reduces when she is supine. She has never had any inflammation or drainage from this area. She has not had any bleeding in this area.  Pre-operative diagnosis:  Umbilical hernia  Post-operative diagnosis:  Same  Procedure:  Umbilical hernia repair   Procedure Details  The patient was seen again in the Holding Room. The risks, benefits, complications, treatment options, and expected outcomes were discussed with the patient. The possibilities of reaction to medication, pulmonary aspiration, perforation of viscus, bleeding, recurrent infection, the need for additional procedures, and development of a complication requiring transfusion or further operation were discussed with the patient and/or family. There was concurrence with the proposed plan, and informed consent was obtained. The site of surgery was properly noted/marked. The patient was taken to the Operating Room, identified as [name], and the procedure verified as umbilical hernia repair. A Time Out was held and the above information confirmed.  After an adequate level of general anesthesia was obtained, the  patient's abdomen was prepped with Chloraprep and draped in sterile fashion.  We made a transverse incision below the umbilicus.  Dissection was carried down to the hernia sac with cautery.  We dissected bluntly around the hernia sac down to the edge of the fascial defect.  We reduced the hernia sac back into the pre-peritoneal space.  The fascial defect measured 1.5 cm.  We cleared the fascia in all directions.  The fascial defect was closed with multiple interrupted figure-of-eight 1 Novofil sutures.  The base of the umbilicus was tacked down with 3-0 Vicryl.  3-0 Vicryl was used to close the subcutaneous tissues and 4-0 Monocryl was used to close the skin.  Steri-strips and clean dressing were applied.  The patient was extubated and brought to the recovery room in stable condition.  All sponge, instrument, and needle counts were correct prior to closure and at the conclusion of the case.   Estimated Blood Loss: Minimal          Complications: None; patient tolerated the procedure well.         Disposition: PACU - hemodynamically stable.         Condition: stable  Imogene Burn. Georgette Dover, MD, [facility] Surgery  General/ Trauma Surgery   09/10/2019 12:00 PM

## 2019-09-10 NOTE — Interval H&P Note (Signed)
History and Physical Interval Note:  09/10/2019 9:39 AM  [name]  has presented today for surgery, with the diagnosis of UMBILICAL HERNIA/CIRRHOSIS.  The various methods of treatment have been discussed with the patient and family. After consideration of risks, benefits and other options for treatment, the patient has consented to  Procedure(s) with comments: [place] (N/A) - LMA as a surgical intervention.  The patient's history has been reviewed, patient examined, no change in status, stable for surgery.  I have reviewed the patient's chart and labs.  Questions were answered to the patient's satisfaction.     Maia Petties

## 2019-09-10 NOTE — Transfer of Care (Signed)
Immediate Anesthesia Transfer of Care Note  Patient: [name]  Procedure(s) Performed: HERNIA REPAIR UMBILICAL ADULT (N/A Abdomen)  Patient Location: PACU  Anesthesia Type:General  Level of Consciousness: awake and patient cooperative  Airway & Oxygen Therapy: Patient Spontanous Breathing and Patient connected to nasal cannula oxygen  Post-op Assessment: Report given to RN and Post -op Vital signs reviewed and stable  Post vital signs: Reviewed and stable  Last Vitals:  Vitals Value Taken Time  BP 133/68 09/10/19 1205  Temp    Pulse 71 09/10/19 1207  Resp 23 09/10/19 1207  SpO2 100 % 09/10/19 1207  Vitals shown include unvalidated device data.  Last Pain:  Vitals:   09/10/19 0919  TempSrc:   PainSc: 0-No pain      Patients Stated Pain Goal: 4 (99/24/15 5161)  Complications: No apparent anesthesia complications

## 2019-09-10 NOTE — Anesthesia Postprocedure Evaluation (Signed)
Anesthesia Post Note  Patient: [name]  Procedure(s) Performed: HERNIA REPAIR UMBILICAL ADULT (N/A Abdomen)     Patient location during evaluation: PACU Anesthesia Type: General Level of consciousness: awake and alert Pain management: pain level controlled Vital Signs Assessment: post-procedure vital signs reviewed and stable Respiratory status: spontaneous breathing, nonlabored ventilation, respiratory function stable and patient connected to nasal cannula oxygen Cardiovascular status: blood pressure returned to baseline and stable Postop Assessment: no apparent nausea or vomiting Anesthetic complications: no    Last Vitals:  Vitals:   09/10/19 1243 09/10/19 1245  BP: (!) 121/57 (!) 121/57  Pulse: 74 73  Resp: 17 17  Temp: 36.4 C   SpO2: 100% 100%    Last Pain:  Vitals:   09/10/19 1243  TempSrc:   PainSc: 3                  Anisah Kuck COKER

## 2019-09-10 NOTE — Anesthesia Procedure Notes (Signed)
Procedure Name: Intubation Date/Time: 09/10/2019 11:27 AM Performed by: Orlie Dakin, CRNA Pre-anesthesia Checklist: Patient identified, Emergency Drugs available, Suction available and Patient being monitored Patient Re-evaluated:Patient Re-evaluated prior to induction Oxygen Delivery Method: Circle system utilized Preoxygenation: Pre-oxygenation with 100% oxygen Induction Type: IV induction Ventilation: Mask ventilation without difficulty Laryngoscope Size: Miller and 2 Grade View: Grade I Tube type: Oral Tube size: 7.0 mm Number of attempts: 1 Airway Equipment and Method: Stylet Placement Confirmation: positive ETCO2,  ETT inserted through vocal cords under direct vision and breath sounds checked- equal and bilateral Secured at: 20 cm Tube secured with: Tape Dental Injury: Teeth and Oropharynx as per pre-operative assessment

## 2019-09-10 NOTE — Discharge Instructions (Signed)
[facility] _______[facility] ° °UMBILICAL OR INGUINAL HERNIA REPAIR: POST OP INSTRUCTIONS ° °Always review your discharge instruction sheet given to you by the facility where your surgery was performed. °IF YOU HAVE DISABILITY OR FAMILY LEAVE FORMS, YOU MUST BRING THEM TO THE OFFICE FOR PROCESSING.   °DO NOT GIVE THEM TO YOUR DOCTOR. ° °1. A  prescription for pain medication may be given to you upon discharge.  Take your pain medication as prescribed, if needed.  If narcotic pain medicine is not needed, then you may take acetaminophen (Tylenol) or ibuprofen (Advil) as needed. °2. Take your usually prescribed medications unless otherwise directed. °If you need a refill on your pain medication, please contact your pharmacy.  They will contact our office to request authorization. Prescriptions will not be filled after 5 pm or on week-ends. °3. You should follow a light diet the first 24 hours after arrival home, such as soup and crackers, etc.  Be sure to include lots of fluids daily.  Resume your normal diet the day after surgery. °4.Most patients will experience some swelling and bruising around the umbilicus or in the groin and scrotum.  Ice packs and reclining will help.  Swelling and bruising can take several days to resolve.  °6. It is common to experience some constipation if taking pain medication after surgery.  Increasing fluid intake and taking a stool softener (such as Colace) will usually help or prevent this problem from occurring.  A mild laxative (Milk of Magnesia or Miralax) should be taken according to package directions if there are no bowel movements after 48 hours. °7. Unless discharge instructions indicate otherwise, you may remove your bandages 24-48 hours after surgery, and you may shower at that time.  You may have steri-strips (small skin tapes) in place directly over the incision.  These strips should be left on the skin for 7-10 days.  If your surgeon used skin glue on the  incision, you may shower in 24 hours.  The glue will flake off over the next 2-3 weeks.  Any sutures or staples will be removed at the office during your follow-up visit. °8. ACTIVITIES:  You may resume regular (light) daily activities beginning the next day--such as daily self-care, walking, climbing stairs--gradually increasing activities as tolerated.  You may have sexual intercourse when it is comfortable.  Refrain from any heavy lifting or straining until approved by your doctor. ° °a.You may drive when you are no longer taking prescription pain medication, you can comfortably wear a seatbelt, and you can safely maneuver your car and apply brakes. °b.RETURN TO WORK:   °_____________________________________________ ° °9.You should see your doctor in the office for a follow-up appointment approximately 2-3 weeks after your surgery.  Make sure that you call for this appointment within a day or two after you arrive home to insure a convenient appointment time. °10.OTHER INSTRUCTIONS: _________________________ °   _____________________________________ ° °WHEN TO CALL YOUR DOCTOR: °1. Fever over 101.0 °2. Inability to urinate °3. Nausea and/or vomiting °4. Extreme swelling or bruising °5. Continued bleeding from incision. °6. Increased pain, redness, or drainage from the incision ° °The clinic staff is available to answer your questions during regular business hours.  Please don’t hesitate to call and ask to speak to one of the nurses for clinical concerns.  If you have a medical emergency, go to the nearest emergency room or call 911.  A surgeon from [facility] is always on call at the hospital ° ° °  [address], [address], [place], [place]  [address] ? ° [address] [address], [place], [place]   [address] °[phone] ? [phone] ? FAX [phone] °Web site: [email or web address] °

## 2019-09-16 ENCOUNTER — Other Ambulatory Visit: Payer: Self-pay | Admitting: Gastroenterology

## 2019-09-19 ENCOUNTER — Ambulatory Visit: Payer: Self-pay | Admitting: *Deleted

## 2019-09-20 ENCOUNTER — Ambulatory Visit: Payer: Self-pay | Admitting: Pharmacist

## 2019-09-25 ENCOUNTER — Other Ambulatory Visit: Payer: Self-pay

## 2019-09-25 ENCOUNTER — Ambulatory Visit
Admission: RE | Admit: 2019-09-25 | Discharge: 2019-09-25 | Disposition: A | Discharge status: Home / Self Care | Payer: Medicare Other | Source: Ambulatory Visit | Attending: Interventional Radiology | Admitting: Interventional Radiology

## 2019-09-25 ENCOUNTER — Encounter: Payer: Self-pay | Admitting: *Deleted

## 2019-09-25 ENCOUNTER — Other Ambulatory Visit: Payer: Self-pay | Admitting: *Deleted

## 2019-09-25 DIAGNOSIS — K7581 Nonalcoholic steatohepatitis (NASH): Secondary | ICD-10-CM | POA: Diagnosis not present

## 2019-09-25 HISTORY — PX: IR RADIOLOGIST EVAL & MGMT: IMG5224

## 2019-09-25 NOTE — Progress Notes (Signed)
Patient ID: [name], female   DOB: 12-05-1945, 74 y.o.   MRN: [id number]       Chief Complaint: Patient was consulted remotely today ([place]) for f/u TIPS at the request of [place].    Referring Physician(s): Oretha Caprice  History of Present Illness: [name] is a 74 y.o. female with a history of NASH cirrhosis   03/28/2019 EGD with esophageal banding x14, subsequently persistent  retrosternal pain   04/06/2019 CT demonstrates portal venous thrombus, possible splenic infarct.  No evidence of liver mass. 04/07/2019 EGD demonstrating esophageal bleeding requiring epinephrine injection and clipping.  Persistent bleeding per rectum. 04/08/2019 TIPS creation with decrease in mean portosystemic gradient from 15 to 4 mmHg, coil embolization of esophageal varices, paracentesis removing 1.5 L   04/12/2019 discharged home, no further bleeding no encephalopathy. 05/15/2019 to ED with worsening encephalopathy, started on maximum lactulose, rifaximin 05/26/2019 to ED with acute metabolic encephalopathy secondary to hypercalcemia, ammonia level stable 05/29/2019 US liver Doppler shows patency of TIPS shunt, concern of right lobe mass 3.8 cm, new since previous imaging from November 05/30/2019 MR liver protocol shows 3 cm mixed signal right lobe mass lateral to the mid portion of the TIPS, indeterminate but biloma favored, some motion degradation on the study  09/06/2019 follow-up MRI shows decrease in size of hematoma 1.7 cm, no new lesion or suspicious hepatic lesion  Patient is doing fairly well at home.   She is tolerating the lactulose.  She denies any confusion, sleepiness, or encephalopathy. No abdominal pain.  Appetite good.  No further GI bleeding.  No abdominal distention.  She had umbilical hernia repair recently, did well with it.  Past Medical History:  Diagnosis Date  . A-fib ([place])    08/19/19 afib with RVR, spontaneously converted to SR  . Allergy   . Arthritis     neck  . Cataract    bilateral - MD monitoring cataracts  . CHF (congestive heart failure) ([place])   . Chronic kidney disease, stage I    DR OTTELIN  HX UTIS  . Cirrhosis ([place])   . Coronary artery disease    s/p CABG 01/29/16: LIMA-LAD, SVG-RI, SVG-dRCA  . Cramp of limb   . Diabetes mellitus   . Dysphagia, unspecified(787.20)   . Dysuria   . Epistaxis   . GERD (gastroesophageal reflux disease)   . Hearing loss    wears hearing aids  . Heart murmur     DX FOR YEARS ASYMPTOMATIC  . Hepatic encephalopathy ([place])   . Lumbago   . Myocardial infarction ([place])    NSTEMI 01/27/16  . Neoplasm of uncertain behavior of skin   . Nonspecific elevation of levels of transaminase or lactic acid dehydrogenase (LDH)   . Osteoarthrosis, unspecified whether generalized or localized, unspecified site   . Other and unspecified hyperlipidemia    diet controlled  . Pain in joint, shoulder region   . Paresthesias 04/01/2015  . Postablative ovarian failure   . Trochanteric bursitis of left hip 12/15/2015  . Type 2 diabetes mellitus without complication ([place])   . Unspecified essential hypertension    no meds  . Wears glasses     Past Surgical History:  Procedure Laterality Date  . BACK SURGERY  06/2015  . BREAST BIOPSY Left   . CARDIAC CATHETERIZATION N/A 01/27/2016   Procedure: Left Heart Cath and Coronary Angiography;  Surgeon: Belva Crome, MD;  Location: [facility];  Service: Cardiovascular;  Laterality: N/A;  . COLONOSCOPY  2012   Dr Lajoyce Corners.   . COLONOSCOPY WITH PROPOFOL N/A 07/07/2016   Procedure: COLONOSCOPY WITH PROPOFOL;  Surgeon: [name], MD;  Location: [facility];  Service: Endoscopy;  Laterality: N/A;  . CORONARY ARTERY BYPASS GRAFT N/A 01/28/2016   Procedure: CORONARY ARTERY BYPASS GRAFTING (CABG) x 3 USING RIGHT LEG GREATER SAPHENOUS VEIN GRAFT;  Surgeon: Melrose Nakayama, MD;  Location: [facility];  Service: Open Heart Surgery;  Laterality: N/A;  . ENDOVEIN HARVEST OF GREATER  SAPHENOUS VEIN Right 01/28/2016   Procedure: ENDOVEIN HARVEST OF GREATER SAPHENOUS VEIN;  Surgeon: Melrose Nakayama, MD;  Location: [facility];  Service: Open Heart Surgery;  Laterality: Right;  . ESOPHAGEAL BANDING  03/28/2019   Procedure: ESOPHAGEAL BANDING;  Surgeon: [name], MD;  Location: [facility];  Service: Endoscopy;;  . ESOPHAGEAL BANDING  04/07/2019   Procedure: ESOPHAGEAL BANDING;  Surgeon: Juanita Craver, MD;  Location: [facility] ENDOSCOPY;  Service: Endoscopy;;  . ESOPHAGOGASTRODUODENOSCOPY N/A 04/07/2019   Procedure: ESOPHAGOGASTRODUODENOSCOPY (EGD);  Surgeon: Juanita Craver, MD;  Location: [facility] ENDOSCOPY;  Service: Endoscopy;  Laterality: N/A;  . ESOPHAGOGASTRODUODENOSCOPY (EGD) WITH PROPOFOL N/A 07/07/2016   Procedure: ESOPHAGOGASTRODUODENOSCOPY (EGD) WITH PROPOFOL;  Surgeon: [name], MD;  [facility]: [facility];  Service: Endoscopy;  Laterality: N/A;  . ESOPHAGOGASTRODUODENOSCOPY (EGD) WITH PROPOFOL N/A 03/28/2019   Procedure: ESOPHAGOGASTRODUODENOSCOPY (EGD) WITH PROPOFOL;  Surgeon: [name], MD;  Location: [facility];  Service: Endoscopy;  Laterality: N/A;  . HEMOSTASIS CLIP PLACEMENT  04/07/2019   Procedure: HEMOSTASIS CLIP PLACEMENT;  Surgeon: Juanita Craver, MD;  Location: [facility];  Service: Endoscopy;;  . IR ANGIOGRAM SELECTIVE EACH ADDITIONAL VESSEL  04/08/2019  . IR EMBO ART  VEN HEMORR LYMPH EXTRAV  INC GUIDE ROADMAPPING  04/08/2019  . IR PARACENTESIS  04/08/2019  . IR RADIOLOGIST EVAL & MGMT  06/13/2019  . IR TIPS  04/08/2019  . MAXIMUM ACCESS (MAS)POSTERIOR LUMBAR INTERBODY FUSION (PLIF) 1 LEVEL Left 06/10/2015   Procedure: FOR MAXIMUM ACCESS (MAS) POSTERIOR LUMBAR INTERBODY FUSION (PLIF) LUMBAR THREE-FOUR EXTRAFORAMINAL MICRODISCECTOMY LUMBAR FIVE-SACRAL ONE LEFT;  Surgeon: Eustace Moore, MD;  Location: [facility];  Service: Neurosurgery;  Laterality: Left;  . RADIOLOGY WITH ANESTHESIA N/A 04/08/2019   Procedure: RADIOLOGY WITH ANESTHESIA;  Surgeon:  Radiologist, Medication, MD;  Location: [facility];  Service: Radiology;  Laterality: N/A;  . SCLEROTHERAPY  04/07/2019   Procedure: SCLEROTHERAPY;  Surgeon: Juanita Craver, MD;  Location[facility] END[facility]COPY;  Service: Endoscopy;;  . TEE WITHOUT CARDIOVERSION N/A 01/28/2016   Procedure: TRANSESOPHAGEAL ECHOCARDIOGRAM (TEE);  Surgeon: Melrose Nakayama, MD;  Location: [place];  Service: Open Heart Surgery;  Laterality: N/A;  . TUBAL LIGATION  1982   Dr Connye Burkitt  . UMBILICAL HERNIA REPAIR N/A 09/10/2019   Procedure: HERNIA REPAIR UMBILICAL ADULT;  Surgeon: Donnie Mesa, MD;  Location: [facility];  Service: General;  Laterality: N/A;  . UPPER GASTROINTESTINAL ENDOSCOPY    . VAGINAL HYSTERECTOMY  1997   Dr Rande Lawman    Allergies: Kiwi extract, Tdap [tetanus-diphth-acell pertussis], Statins, Latex, and Tramadol  Medications: Prior to Admission medications   Medication Sig Start Date End Date Taking? Authorizing Provider  acetaminophen (TYLENOL) 500 MG tablet Take 500 mg by mouth at bedtime.     [provider]  Amino Acids-Protein Hydrolys (FEEDING SUPPLEMENT, PRO-STAT SUGAR FREE 64,) LIQD Take 30 mLs by mouth 3 (three) times daily with meals.    [provider]  BD PEN NEEDLE NANO U/F 32G X 4 MM MISC USE THREE  TIMES DAILY AS DIRECTED 11/26/18   Reed, Tiffany L, DO  Biotin 10000 MCG TABS Take 10,000 mcg by mouth every morning.    [provider]  colchicine 0.6 MG tablet Take 1 tablet (0.6 mg total) by mouth daily. 08/22/19   Kathyrn Drown D, NP  Cyanocobalamin (VITAMIN B 12 PO) Take 1,000 mcg by mouth daily.      [provider]  ezetimibe (ZETIA) 10 MG tablet Take one tablet by mouth once daily 07/12/19   Reed, Tiffany L, DO  furosemide (LASIX) 40 MG tablet Take 1 tablet (40 mg total) by mouth daily. 08/22/19   Kathyrn Drown D, NP  glucose blood test strip One Touch Ultra II strips. Use to test blood sugar three times daily. Dx: E11.65 06/01/17   Reed, Tiffany L, DO   HYDROcodone-acetaminophen (NORCO/VICODIN) 5-325 MG tablet Take 1 tablet by mouth every 6 (six) hours as needed for moderate pain. 09/10/19   Donnie Mesa, MD  insulin aspart (NOVOLOG FLEXPEN) 100 UNIT/ML FlexPen Inject 8-12 Units into the skin See admin instructions. 12 units at breakfast, 8 units at lunch and 12 at dinner     [provider]  insulin detemir (LEVEMIR FLEXTOUCH) 100 UNIT/ML FlexPen Inject 30 Units into the skin at bedtime.     [provider]  Insulin Syringe-Needle U-100 (INSULIN SYRINGE 1CC/31GX5/16") 31G X 5/16" 1 ML MISC USE AS DIRECTED DAILY WITH LEVEMIR. Dx: E11.65 07/29/19   Reed, Tiffany L, DO  JARDIANCE 10 MG TABS tablet TAKE 1 TABLET BY MOUTH DAILY 08/26/19   Reed, Tiffany L, DO  JARDIANCE 25 MG TABS tablet TAKE 1 TABLET BY MOUTH DAILY 06/28/19   Reed, Tiffany L, DO  lactulose (CHRONULAC) 10 GM/15ML solution TAKE 45 ML BY MOUTH THREE TIMES DAILY AS DIRECTED 09/16/19   [name], MD  loratadine (CLARITIN) 10 MG tablet Take 10 mg by mouth daily as needed for allergies.    [provider]  MAGNESIUM PO Take 500 mg by mouth daily.     [provider]  metoprolol tartrate (LOPRESSOR) 25 MG tablet Take 1 tablet (25 mg total) by mouth as needed (use as needed for HR greater than 110). 08/21/19   Tommie Raymond, NP  Multiple Vitamins-Minerals (MULTIVITAMIN WITH MINERALS) tablet Take 1 tablet by mouth daily.      [provider]  nadolol (CORGARD) 40 MG tablet TAKE 1 TABLET(40 MG) BY MOUTH DAILY 08/26/19   Reed, Tiffany L, DO  omeprazole (PRILOSEC) 20 MG capsule Take 20 mg by mouth daily. 08/25/19   [provider]  ondansetron (ZOFRAN) 4 MG tablet Take 1 tablet (4 mg total) by mouth every 6 (six) hours as needed for nausea. 05/18/19   Desiree Hane, MD  pantoprazole (PROTONIX) 40 MG tablet Take 40 mg by mouth 2 (two) times daily.    [provider]  Polyethyl Glycol-Propyl Glycol (SYSTANE OP) Place 1 drop into both  eyes daily.     [provider]  Probiotic Product (PROBIOTIC DAILY PO) Take 1 capsule by mouth daily. Digestive Advantage Probiotic    [provider]  rifaximin (XIFAXAN) 550 MG TABS tablet Take 1 tablet (550 mg total) by mouth 2 (two) times daily. 06/05/19   [name], MD     Family History  Problem Relation Age of Onset  . Lung cancer Father   . Arthritis Sister   . Arthritis Brother   . Liver cancer Brother   . Heart disease Maternal  Grandmother   . Heart disease Maternal Grandfather   . Heart disease Paternal Grandmother   . Heart disease Paternal Grandfather   . Breast cancer Mother   . Other Daughter        house fire  . Breast cancer Maternal Aunt   . Breast cancer Paternal Aunt   . Colon cancer Neg Hx   . Esophageal cancer Neg Hx   . Rectal cancer Neg Hx   . Stomach cancer Neg Hx     Social History   Socioeconomic History  . Marital status: Married    Spouse name: Not on file  . Number of children: 6  . Years of education: Not on file  . Highest education level: Not on file  Occupational History  . Occupation: retired  Tobacco Use  . Smoking status: Never Smoker  . Smokeless tobacco: Never Used  Substance and Sexual Activity  . Alcohol use: No  . Drug use: No  . Sexual activity: Yes    Partners: Male    Birth control/protection: Post-menopausal, Surgical    Comment: Hysterectomy  Other Topics Concern  . Not on file  Social History Narrative   She is retired she is married and lives with her husband   6 children   No alcohol or tobacco never smoker no drug use   Social Determinants of Radio broadcast assistant Strain:   . Difficulty of Paying Living Expenses:   Food Insecurity: No Food Insecurity  . Worried About Charity fundraiser in the Last Year: Never true  . Ran Out of Food in the Last Year: Never true  Transportation Needs: No Transportation Needs  . Lack of Transportation (Medical): No  . Lack of Transportation  (Non-Medical): No  Physical Activity:   . Days of Exercise per Week:   . Minutes of Exercise per Session:   Stress:   . Feeling of Stress :   Social Connections:   . Frequency of Communication with Friends and Family:   . Frequency of Social Gatherings with Friends and Family:   . Attends Religious Services:   . Active Member of Clubs or Organizations:   . Attends Archivist Meetings:   [name] Kitchen Marital Status:     ECOG Status: 2 - Symptomatic, <50% confined to bed  Review of Systems  Review of Systems: A 12 point ROS discussed and pertinent positives are indicated in the HPI above.  All other systems are negative.  Physical Exam No direct physical exam was performed (except for noted visual exam findings with Video Visits).     Vital Signs: LMP  (LMP Unknown)   Imaging: MR ABDOMEN WWO CONTRAST  Result Date: 09/06/2019 CLINICAL DATA:  Status post tips, history of cirrhosis EXAM: MRI ABDOMEN WITHOUT AND WITH CONTRAST TECHNIQUE: Multiplanar multisequence MR imaging of the abdomen was performed both before and after the administration of intravenous contrast. CONTRAST:  62m GADAVIST GADOBUTROL 1 MMOL/ML IV SOLN COMPARISON:  Multiple prior studies most recent is an MRI evaluation from 05/30/2019. FINDINGS: Lower chest: No consolidation or pleural effusion. Post median sternotomy. No pericardial fluid. Limited assessment of the lung bases on MR. Hepatobiliary: 17 x 17 mm area of predominantly low T2 signal with some susceptibility artifact on out of phase and in phase gradient echo imaging, previously measuring approximately 3.3 by 2.9 cm. Signs of cirrhosis and portal hypertension as before. Cholelithiasis without biliary ductal dilation, common bile duct is of upper limits of normal and unchanged. Pancreas: Small  cystic foci in the pancreas largest 8 mm (image 19, series 7) this is in the neck of the pancreas. Main pancreatic duct approximately 2 mm. Edema precluded evaluation in this  area on previous imaging studies. Suggestion of this area on previous exam. Spleen:  Spleen remains enlarged without focal lesion. Adrenals/Urinary Tract: Adrenal glands are normal. Renal contours are smooth. There is no sign of hydronephrosis. Stomach/Bowel: Signs of gastric varices and perigastric venous collaterals similar to the prior study. Vascular/Lymphatic: Upper abdominal collateral pathways with similar appearance. Signs of coronary vein embolization are similar to the prior study. The patency of the tips is not well assessed due to abundant susceptibility artifact. No evidence of adenopathy. Other: Bowel edema in the setting of portal hypertension. No acute intra-abdominal process related to bowel, study not protocol for bowel evaluation. Musculoskeletal: Signs of lumbar spinal fusion. Incompletely imaged, grossly unchanged. No destructive bone process. IMPRESSION: 1. Decreased size of presumed post tips hematoma. Biloma is another differential consideration but it shows diminished size and no sign of enhancement. No suspicious hepatic lesion to suggest hepatocellular carcinoma in the setting of cirrhosis. 2. Post tips procedure. Shunt patency not well assessed due to susceptibility artifact. The tips appears grossly patent particularly in the midportion where there is both flow related signal and contrast opacification. As it curves in plane at the cephalad and inferior margins susceptibility artifact and direction of flow makes assessment difficult. Still with abundant portosystemic collaterals in the upper abdomen and with changes of venous embolization in the hepatic gastric ligament. 3. Small cystic foci in the neck of the pancreas measuring 8 mm, largest 8 mm, grossly unchanged compared to the prior study. Main pancreatic duct is of upper limits of normal and there is no associated pancreatic ductal dilatation. These are nonspecific but could represent small side branch intraductal papillary  mucinous neoplasms (IPMN). Recommend follow-up imaging in 2 years. This recommendation follows ACR consensus guidelines: Management of Incidental Pancreatic Cysts: A White Paper of the ACR Incidental Findings Committee. [place] [id number];11:941-740. Areas were not well assessed on previous imaging studies due to edema. 4. Cholelithiasis without evidence of acute cholecystitis. Electronically Signed   By: Zetta Bills M.D.   On: 09/06/2019 17:14    Labs:  CBC: Recent Labs    08/19/19 1127 08/20/19 0307 08/21/19 0415 09/10/19 0826  WBC 6.4 8.0 4.0 4.0  HGB 13.0 12.4 12.2 12.7  HCT 40.7 37.7 37.8 40.9  PLT 102* 117* 110* 91*    COAGS: Recent Labs    04/17/19 1105 05/13/19 1436 05/15/19 1042 08/19/19 1529  INR 1.5* 1.1 1.2 1.1    BMP: Recent Labs    06/01/19 0239 06/01/19 0239 06/11/19 0000 08/19/19 1127 08/20/19 0307 09/10/19 0844  NA 140  --  137 140 139 140  K 3.6   < > 4.3 3.3* 3.9 3.7  CL 116*   < > 98* 104 106 108  CO2 18*   < > 22 25 24 22   GLUCOSE 239*  --   --  164* 148* 128*  BUN 12  --  24* 19 19 11   CALCIUM 9.1   < > 10.0 9.5 9.1 9.3  CREATININE 0.82  --  0.9  0.9 0.96 1.02* 1.00  GFRNONAA >60   < > 69 59* 55* 56*  GFRAA >60   < > 79 >60 >60 >60   < > = values in this interval not displayed.    LIVER FUNCTION TESTS: Recent Labs  05/26/19 1332 05/26/19 1332 05/27/19 0352 05/28/19 0257 06/01/19 0239 06/11/19 0000 08/19/19 1529 09/10/19 0844  BILITOT 3.1*  --  2.2*  --   --   --  2.2* 1.7*  AST 60*   < > 53*  --   --  80* 63* 79*  ALT 45*   < > 36  --   --  42* 41 51*  ALKPHOS 190*   < > 148*  --   --  259* 144* 141*  PROT 7.4  --  5.8*  --   --   --  7.4 6.3*  ALBUMIN 2.8*   < > 2.2*   < > 1.9* 2.7* 3.0* 2.6*   < > = values in this interval not displayed.    TUMOR MARKERS: Recent Labs    03/11/19 0932  AFPTM 2.3    Assessment and Plan:  My impression is that this patient continues to do well after urgent TIPS for variceal  bleeding.  Follow-up MR shows resolving intraparenchymal hematoma presumably related to the procedure, which is currently asymptomatic.  Plan continued lactulose p.o. as needed to prevent encephalopathy.  Follow-up with Dr. [name] regarding possible liver transplant evaluation. We will plan a follow-up TIPS ultrasound in 9 months to confirm TIPS patency. She knows to call in the interval should she have new abdominal distention, GI bleeding, or other questions about the TIPS.  Thank you for this interesting consult.  I greatly enjoyed meeting [name] and look forward to participating in their care.  A copy of this report was sent to the requesting provider on this date.  Electronically Signed: Rickard Rhymes 09/25/2019, 4:01 PM   I spent a total of    15 Minutes in remote  clinical consultation, greater than 50% of which was counseling/coordinating care for follow-up TIPS placement.    Visit type: Audio only (telephone). Audio (no video) only due to patient's lack of internet/smartphone capability. Alternative for in-person consultation at [facility], [address], [place], [place]. This visit type was conducted due to national recommendations for restrictions regarding the COVID-19 Pandemic (e.g. social distancing).  This format is felt to be most appropriate for this patient at this time.  All issues noted in this document were discussed and addressed.

## 2019-09-25 NOTE — Patient Outreach (Signed)
[facility]) Care Management  09/25/2019  [name] 02/01/1946 [id number]   Call placed to member's son to follow up on member's status after having hernia surgery on 5/4.  He state she has recovered well, no longer having issues related to her hernia.  Also report her liver function has been doing well, recently had MRI and will have virtual visit with interventional radiology later this afternoon.  Follow up with PCP scheduled for 6/8.  He report member's blood sugars are still variable at times, ranging 150's-200's.  Her A1C has decreased from 10 in May 2020 to 6.2 in April 2021.  Family continue to monitor member's diet as well as overall management of her care.  Denies any urgent concerns at this time, will follow up within the next month.  THN CM Care Plan Problem One     Most Recent Value  Care Plan Problem One  Risk for readmission related to abnormal HR as evidenced by Recent hospitalizations  Role Documenting the Problem One  Care Management [place] for Problem One  Active  THN Long Term Goal   Member will not be readmitted to hospital within the next 31 days  THN Long Term Goal Start Date  08/23/19  [facility] Long Term Goal Met Date  09/25/19  [facility] CM Short Term Goal #1   Member will keep and attend follow up appointment with cardiology within the next 3 weeks  THN CM Short Term Goal #1 Start Date  09/25/19 [Date reset]  Interventions for Short Term Goal #1  Encouraged to rescheduled missed visit as soon as possible     [name], Therapist, sports, MSN [place] Manager [phone]

## 2019-09-27 ENCOUNTER — Encounter: Payer: Self-pay | Admitting: Gastroenterology

## 2019-10-08 ENCOUNTER — Other Ambulatory Visit: Payer: Medicare Other

## 2019-10-08 ENCOUNTER — Other Ambulatory Visit: Payer: Self-pay

## 2019-10-08 DIAGNOSIS — E785 Hyperlipidemia, unspecified: Secondary | ICD-10-CM | POA: Diagnosis not present

## 2019-10-08 DIAGNOSIS — Z794 Long term (current) use of insulin: Secondary | ICD-10-CM | POA: Diagnosis not present

## 2019-10-08 DIAGNOSIS — E1169 Type 2 diabetes mellitus with other specified complication: Secondary | ICD-10-CM

## 2019-10-08 DIAGNOSIS — E119 Type 2 diabetes mellitus without complications: Secondary | ICD-10-CM | POA: Diagnosis not present

## 2019-10-09 LAB — CBC WITH DIFFERENTIAL/PLATELET
Absolute Monocytes: 414 cells/uL (ref 200–950)
Basophils Absolute: 30 cells/uL (ref 0–200)
Basophils Relative: 1 %
Eosinophils Absolute: 99 cells/uL (ref 15–500)
Eosinophils Relative: 3.3 %
HCT: 37.2 % (ref 35.0–45.0)
Hemoglobin: 12.2 g/dL (ref 11.7–15.5)
Lymphs Abs: 789 cells/uL — ABNORMAL LOW (ref 850–3900)
MCH: 29.5 pg (ref 27.0–33.0)
MCHC: 32.8 g/dL (ref 32.0–36.0)
MCV: 89.9 fL (ref 80.0–100.0)
MPV: 13 fL — ABNORMAL HIGH (ref 7.5–12.5)
Monocytes Relative: 13.8 %
Neutro Abs: 1668 cells/uL (ref 1500–7800)
Neutrophils Relative %: 55.6 %
Platelets: 103 10*3/uL — ABNORMAL LOW (ref 140–400)
RBC: 4.14 10*6/uL (ref 3.80–5.10)
RDW: 13.7 % (ref 11.0–15.0)
Total Lymphocyte: 26.3 %
WBC: 3 10*3/uL — ABNORMAL LOW (ref 3.8–10.8)

## 2019-10-09 LAB — LIPID PANEL
Cholesterol: 150 mg/dL (ref <200)
HDL: 68 mg/dL (ref >=50)
LDL Cholesterol (Calc): 66 mg/dL (calc)
Non-HDL Cholesterol (Calc): 82 mg/dL (calc) (ref <130)
Total CHOL/HDL Ratio: 2.2 (calc) (ref <5.0)
Triglycerides: 76 mg/dL (ref <150)

## 2019-10-09 LAB — COMPLETE METABOLIC PANEL WITH GFR
AG Ratio: 0.9 (calc) — ABNORMAL LOW (ref 1.0–2.5)
ALT: 35 U/L — ABNORMAL HIGH (ref 6–29)
AST: 55 U/L — ABNORMAL HIGH (ref 10–35)
Albumin: 3 g/dL — ABNORMAL LOW (ref 3.6–5.1)
Alkaline phosphatase (APISO): 150 U/L (ref 37–153)
BUN: 18 mg/dL (ref 7–25)
CO2: 27 mmol/L (ref 20–32)
Calcium: 9.3 mg/dL (ref 8.6–10.4)
Chloride: 107 mmol/L (ref 98–110)
Creat: 0.91 mg/dL (ref 0.60–0.93)
GFR, Est African American: 73 mL/min/{1.73_m2} (ref >=60)
GFR, Est Non African American: 63 mL/min/{1.73_m2} (ref >=60)
Globulin: 3.2 g/dL (calc) (ref 1.9–3.7)
Glucose, Bld: 137 mg/dL — ABNORMAL HIGH (ref 65–99)
Potassium: 3.6 mmol/L (ref 3.5–5.3)
Sodium: 140 mmol/L (ref 135–146)
Total Bilirubin: 1.5 mg/dL — ABNORMAL HIGH (ref 0.2–1.2)
Total Protein: 6.2 g/dL (ref 6.1–8.1)

## 2019-10-09 LAB — TSH: TSH: 3.96 mIU/L (ref 0.40–4.50)

## 2019-10-09 LAB — HEMOGLOBIN A1C
Hgb A1c MFr Bld: 6.9 % of total Hgb — ABNORMAL HIGH (ref <5.7)
Mean Plasma Glucose: 151 (calc)
eAG (mmol/L): 8.4 (calc)

## 2019-10-14 ENCOUNTER — Other Ambulatory Visit: Payer: Self-pay

## 2019-10-14 ENCOUNTER — Encounter: Payer: Self-pay | Admitting: Internal Medicine

## 2019-10-14 ENCOUNTER — Other Ambulatory Visit: Payer: Self-pay | Admitting: Internal Medicine

## 2019-10-14 ENCOUNTER — Ambulatory Visit
Admission: RE | Admit: 2019-10-14 | Discharge: 2019-10-14 | Disposition: A | Discharge status: Home / Self Care | Payer: Medicare Other | Source: Ambulatory Visit | Attending: Internal Medicine | Admitting: Internal Medicine

## 2019-10-14 ENCOUNTER — Ambulatory Visit (INDEPENDENT_AMBULATORY_CARE_PROVIDER_SITE_OTHER): Payer: Medicare Other | Admitting: Internal Medicine

## 2019-10-14 VITALS — BP 108/60 | HR 65 | Temp 98.0°F | Ht 63.0 in | Wt 141.8 lb

## 2019-10-14 DIAGNOSIS — M1612 Unilateral primary osteoarthritis, left hip: Secondary | ICD-10-CM | POA: Diagnosis not present

## 2019-10-14 DIAGNOSIS — E785 Hyperlipidemia, unspecified: Secondary | ICD-10-CM

## 2019-10-14 DIAGNOSIS — M25552 Pain in left hip: Secondary | ICD-10-CM

## 2019-10-14 DIAGNOSIS — Z794 Long term (current) use of insulin: Secondary | ICD-10-CM | POA: Diagnosis not present

## 2019-10-14 DIAGNOSIS — Z66 Do not resuscitate: Secondary | ICD-10-CM

## 2019-10-14 DIAGNOSIS — E1165 Type 2 diabetes mellitus with hyperglycemia: Secondary | ICD-10-CM

## 2019-10-14 DIAGNOSIS — K7460 Unspecified cirrhosis of liver: Secondary | ICD-10-CM

## 2019-10-14 DIAGNOSIS — E1169 Type 2 diabetes mellitus with other specified complication: Secondary | ICD-10-CM | POA: Diagnosis not present

## 2019-10-14 DIAGNOSIS — R188 Other ascites: Secondary | ICD-10-CM | POA: Diagnosis not present

## 2019-10-14 MED ORDER — GLUCOSE BLOOD VI STRP: ORAL_STRIP | 3 refills | Status: DC

## 2019-10-14 MED ORDER — BD PEN NEEDLE NANO U/F 32G X 4 MM MISC: 6 refills | Status: DC

## 2019-10-14 NOTE — Progress Notes (Signed)
Location:  [facility] clinic Provider:  Veda Arrellano L. Mariea Clonts, D.O., C.M.D.  Code Status: DNR Goals of Care:  Advanced Directives 09/10/2019  Does Patient Have a Medical Advance Directive? No  Type of Advance Directive -  Does patient want to make changes to medical advance directive? -  Would patient like information on creating a medical advance directive? No - Patient declined     Chief Complaint  Patient presents with  . Medical Management of Chronic Issues    Patient retuns to the office to discuss lab results.    HPI: Patient is a 74 y.o. female seen today for medical management of chronic diseases and to review labs.    Legs are more swollen and right is worse than left.  Going on a while.  They looked better to me back on 4/19.  She is watching her salt.  Left leg still hurts her (hip) and limits walking.  She is just on lasix 71m now.  Aldactone was not working well due to kidneys so was stopped previously.    Cholesterol was great on labs.    Sugar average was 6.9--very good--best I can remember for her.  They're also doing water with lemon in it and she attributes it to that, but it sounds to me like their diet has been much better since her serious hospitalization for her cirrhosis and encephalopathy when her sons got involved in their eating habits..  Had a low sugar on Sunday of 73 (not that low truly).  Her chest will itch when her sugar is low which she says is different, but at least she knows.    Liver tests are down a little.  She eats "only chicken or [place]"   Had her hernia surgery which went well.  All healed up.  No problems and it was less serious when Dr. TGeorgette Doverdid it than he originally thought.  She has not used any novolog in 6 weeks.  We opted to stop it since she's not used the 8/12/8 units that whole time and hba1c so good.  Don't want lows for her.  She had stopped her calcium 608mtwice a day and calcium level got high.  Nails are breaking all the time.   Wants to start some back.  Discussed just 60067mnce a day.    Left hip pain.  Actually begins in leg and goes to lateral hip.  Happens when walking and limits her mobility tremendously--she'd love to be able to walk even just a 1/2 mile.  Past Medical History:  Diagnosis Date  . A-fib ([place]  08/19/19 afib with RVR, spontaneously converted to SR  . Allergy   . Arthritis    neck  . Cataract    bilateral - MD monitoring cataracts  . CHF (congestive heart failure) ([place] . Chronic kidney disease, stage I    DR OTTELIN  HX UTIS  . Cirrhosis ([place] . Coronary artery disease    s/p CABG 01/29/16: LIMA-LAD, SVG-RI, SVG-dRCA  . Cramp of limb   . Diabetes mellitus   . Dysphagia, unspecified(787.20)   . Dysuria   . Epistaxis   . GERD (gastroesophageal reflux disease)   . Hearing loss    wears hearing aids  . Heart murmur     DX FOR YEARS ASYMPTOMATIC  . Hepatic encephalopathy ([place] . Lumbago   . Myocardial infarction ([place]  NSTEMI 01/27/16  . Neoplasm of uncertain behavior of skin   .  Nonspecific elevation of levels of transaminase or lactic acid dehydrogenase (LDH)   . Osteoarthrosis, unspecified whether generalized or localized, unspecified site   . Other and unspecified hyperlipidemia    diet controlled  . Pain in joint, shoulder region   . Paresthesias 04/01/2015  . Postablative ovarian failure   . Trochanteric bursitis of left hip 12/15/2015  . Type 2 diabetes mellitus without complication ([place])   . Unspecified essential hypertension    no meds  . Wears glasses     Past Surgical History:  Procedure Laterality Date  . BACK SURGERY  06/2015  . BREAST BIOPSY Left   . CARDIAC CATHETERIZATION N/A 01/27/2016   Procedure: Left Heart Cath and Coronary Angiography;  Surgeon: Belva Crome, MD;  Location: [place] CV LAB;  Service: Cardiovascular;  Laterality: N/A;  . COLONOSCOPY  2012   Dr Lajoyce Corners.   . COLONOSCOPY WITH PROPOFOL N/A 07/07/2016   Procedure: COLONOSCOPY WITH  PROPOFOL;  Surgeon: [name], MD;  Location: [facility] ENDOSCOPY;  Service: Endoscopy;  Laterality: N/A;  . CORONARY ARTERY BYPASS GRAFT N/A 01/28/2016   Procedure: CORONARY ARTERY BYPASS GRAFTING (CABG) x 3 USING RIGHT LEG GREATER SAPHENOUS VEIN GRAFT;  Surgeon: Melrose Nakayama, MD;  Location: [place];  Service: Open Heart Surgery;  Laterality: N/A;  . ENDOVEIN HARVEST OF GREATER SAPHENOUS VEIN Right 01/28/2016   Procedure: ENDOVEIN HARVEST OF GREATER SAPHENOUS VEIN;  Surgeon: Melrose Nakayama, MD;  Location: [place];  Service: Open Heart Surgery;  Laterality: Right;  . ESOPHAGEAL BANDING  03/28/2019   Procedure: ESOPHAGEAL BANDING;  Surgeon: [name], MD;  Location: [facility]OS[facility];  Service: Endoscopy;;  . ESOPHAGEAL BANDING  04/07/2019   Procedure: ESOPHAGEAL BANDING;  Surgeon: Juanita Craver, MD;  Location: [facility] ENDOSCOPY;  Service: Endoscopy;;  . ESOPHAGOGASTRODUODENOSCOPY N/A 04/07/2019   Procedure: ESOPHAGOGASTRODUODENOSCOPY (EGD);  Surgeon: Juanita Craver, MD;  Location: [facility] ENDOSCOPY;  Service: Endoscopy;  Laterality: N/A;  . ESOPHAGOGASTRODUODENOSCOPY (EGD) WITH PROPOFOL N/A 07/07/2016   Procedure: ESOPHAGOGASTRODUODENOSCOPY (EGD) WITH PROPOFOL;  Surgeon: [name], MD;  Location: [facility];  Service: Endoscopy;  Laterality: N/A;  . ESOPHAGOGASTRODUODENOSCOPY (EGD) WITH PROPOFOL N/A 03/28/2019   Procedure: ESOPHAGOGASTRODUODENOSCOPY (EGD) WITH PROPOFOL;  Surgeon: [name], MD;  Location: [facility];  Service: Endoscopy;  Laterality: N/A;  . HEMOSTASIS CLIP PLACEMENT  04/07/2019   Procedure: HEMOSTASIS CLIP PLACEMENT;  Surgeon: Juanita Craver, MD;  Location: [place] ENDOSCOPY;  Service: Endoscopy;;  . IR ANGIOGRAM SELECTIVE EACH ADDITIONAL VESSEL  04/08/2019  . IR EMBO ART  VEN HEMORR LYMPH EXTRAV  INC GUIDE ROADMAPPING  04/08/2019  . IR PARACENTESIS  04/08/2019  . IR RADIOLOGIST EVAL & MGMT  06/13/2019  . IR RADIOLOGIST EVAL & MGMT  09/25/2019  . IR TIPS  04/08/2019  .  MAXIMUM ACCESS (MAS)POSTERIOR LUMBAR INTERBODY FUSION (PLIF) 1 LEVEL Left 06/10/2015   Procedure: FOR MAXIMUM ACCESS (MAS) POSTERIOR LUMBAR INTERBODY FUSION (PLIF) LUMBAR THREE-FOUR EXTRAFORAMINAL MICRODISCECTOMY LUMBAR FIVE-SACRAL ONE LEFT;  Surgeon: Eustace Moore, MD;  Location: [place] NEURO ORS;  Service: Neurosurgery;  Laterality: Left;  . RADIOLOGY WITH ANESTHESIA N/A 04/08/2019   Procedure: RADIOLOGY WITH ANESTHESIA;  Surgeon: Radiologist, Medication, MD;  Location: [place];  Service: Radiology;  Laterality: N/A;  . SCLEROTHERAPY  04/07/2019   Procedure: SCLEROTHERAPY;  Surgeon: Juanita Craver, MD;  Location: [facility] ENDOSCOPY;  Service: Endoscopy;;  . TEE WITHOUT CARDIOVERSION N/A 01/28/2016   Procedure: TRANSESOPHAGEAL ECHOCARDIOGRAM (TEE);  Surgeon: Melrose Nakayama, MD;  Location: [place];  Service:  Open Heart Surgery;  Laterality: N/A;  . TUBAL LIGATION  1982   Dr Connye Burkitt  . UMBILICAL HERNIA REPAIR N/A 09/10/2019   Procedure: HERNIA REPAIR UMBILICAL ADULT;  Surgeon: Donnie Mesa, MD;  Location: [place];  Service: General;  Laterality: N/A;  . UPPER GASTROINTESTINAL ENDOSCOPY    . VAGINAL HYSTERECTOMY  1997   Dr Rande Lawman    Allergies  Allergen Reactions  . Kiwi Extract Anaphylaxis  . Tdap [Tetanus-Diphth-Acell Pertussis] Swelling and Other (See Comments)    Swelling at injection site, gets very hot  . Statins Other (See Comments)    RHABDOMYOLYSIS  . Latex Itching, Dermatitis and Rash  . Tramadol Nausea And Vomiting    Outpatient Encounter Medications as of 10/14/2019  Medication Sig  . acetaminophen (TYLENOL) 500 MG tablet Take 500 mg by mouth at bedtime.   . Amino Acids-Protein Hydrolys (FEEDING SUPPLEMENT, PRO-STAT SUGAR FREE 64,) LIQD Take 30 mLs by mouth 3 (three) times daily with meals.  . BD PEN NEEDLE NANO U/F 32G X 4 MM MISC USE THREE TIMES DAILY AS DIRECTED  . Biotin 10000 MCG TABS Take 10,000 mcg by mouth every morning.  . colchicine 0.6 MG tablet Take 1 tablet (0.6 mg total) by  mouth daily.  . Cyanocobalamin (VITAMIN B 12 PO) Take 1,000 mcg by mouth daily.    [name] Kitchen ezetimibe (ZETIA) 10 MG tablet Take one tablet by mouth once daily  . furosemide (LASIX) 40 MG tablet Take 1 tablet (40 mg total) by mouth daily.  [name] Kitchen glucose blood test strip One Touch Ultra II strips. Use to test blood sugar three times daily. Dx: E11.65  . HYDROcodone-acetaminophen (NORCO/VICODIN) 5-325 MG tablet Take 1 tablet by mouth every 6 (six) hours as needed for moderate pain.  [name] Kitchen insulin aspart (NOVOLOG FLEXPEN) 100 UNIT/ML FlexPen Inject 8-12 Units into the skin See admin instructions. 12 units at breakfast, 8 units at lunch and 12 at dinner   . insulin detemir (LEVEMIR FLEXTOUCH) 100 UNIT/ML FlexPen Inject 30 Units into the skin at bedtime.   . Insulin Syringe-Needle U-100 (INSULIN SYRINGE 1CC/31GX5/16") 31G X 5/16" 1 ML MISC USE AS DIRECTED DAILY WITH LEVEMIR. Dx: E11.65  . JARDIANCE 25 MG TABS tablet TAKE 1 TABLET BY MOUTH DAILY  . lactulose (CHRONULAC) 10 GM/15ML solution TAKE 45 ML BY MOUTH THREE TIMES DAILY AS DIRECTED  . loratadine (CLARITIN) 10 MG tablet Take 10 mg by mouth daily as needed for allergies.  [name] Kitchen MAGNESIUM PO Take 500 mg by mouth daily.   . metoprolol tartrate (LOPRESSOR) 25 MG tablet Take 1 tablet (25 mg total) by mouth as needed (use as needed for HR greater than 110).  . Multiple Vitamins-Minerals (MULTIVITAMIN WITH MINERALS) tablet Take 1 tablet by mouth daily.    . ondansetron (ZOFRAN) 4 MG tablet Take 1 tablet (4 mg total) by mouth every 6 (six) hours as needed for nausea.  . pantoprazole (PROTONIX) 40 MG tablet Take 40 mg by mouth 2 (two) times daily.  Vladimir Faster Glycol-Propyl Glycol (SYSTANE OP) Place 1 drop into both eyes daily.   . Probiotic Product (PROBIOTIC DAILY PO) Take 1 capsule by mouth daily. Digestive Advantage Probiotic  . rifaximin (XIFAXAN) 550 MG TABS tablet Take 1 tablet (550 mg total) by mouth 2 (two) times daily.  . [DISCONTINUED] JARDIANCE 10 MG TABS tablet  TAKE 1 TABLET BY MOUTH DAILY  . [DISCONTINUED] nadolol (CORGARD) 40 MG tablet TAKE 1 TABLET(40 MG) BY MOUTH DAILY  . [DISCONTINUED] omeprazole (PRILOSEC) 20 MG capsule  Take 20 mg by mouth daily.   No facility-administered encounter medications on file as of 10/14/2019.    Review of Systems:  Review of Systems  Constitutional: Negative for chills, fever and malaise/fatigue.       Feels a lot better these days  HENT: Negative for congestion and sore throat.   Eyes: Negative for blurred vision.  Respiratory: Negative for cough and shortness of breath.   Cardiovascular: Positive for leg swelling. Negative for chest pain, palpitations, orthopnea and PND.  Gastrointestinal: Negative for abdominal pain, blood in stool, constipation, diarrhea and melena.  Genitourinary: Positive for frequency. Negative for dysuria.  Musculoskeletal: Positive for joint pain. Negative for falls.       Left hip/thigh/leg; not back  Skin: Negative for itching and rash.  Neurological: Negative for dizziness and loss of consciousness.  Endo/Heme/Allergies: Bruises/bleeds easily.  Psychiatric/Behavioral: Negative for depression and memory loss. The patient is not nervous/anxious and does not have insomnia.        Mind quite clear now    Health Maintenance  Topic Date Due  . COVID-19 Vaccine (1) Never done  . OPHTHALMOLOGY EXAM  08/24/2018  . FOOT EXAM  10/04/2019  . INFLUENZA VACCINE  12/08/2019  . URINE MICROALBUMIN  01/31/2020  . HEMOGLOBIN A1C  04/08/2020  . MAMMOGRAM  09/25/2020  . COLONOSCOPY  07/07/2021  . DEXA SCAN  Completed  . Hepatitis C Screening  Completed  . PNA vac Low Risk Adult  Completed  . TETANUS/TDAP  Discontinued    Physical Exam: Vitals:   10/14/19 1300  BP: 108/60  Pulse: 65  Temp: 98 F (36.7 C)  SpO2: 98%  Weight: 141 lb 12.8 oz (64.3 kg)  Height: 5' 3"  (1.6 m)   Body mass index is 25.12 kg/m. Physical Exam Vitals reviewed.  Constitutional:      General: She is not in  acute distress.    Appearance: Normal appearance. She is not toxic-appearing.  HENT:     Head: Normocephalic and atraumatic.  Eyes:     General: No scleral icterus. Cardiovascular:     Rate and Rhythm: Rhythm irregular.  Pulmonary:     Effort: Pulmonary effort is normal.     Breath sounds: Normal breath sounds. No rales.  Abdominal:     General: Bowel sounds are normal. There is no distension.     Palpations: Abdomen is soft.     Tenderness: There is no abdominal tenderness. There is no guarding or rebound.  Musculoskeletal:        General: Normal range of motion.     Right lower leg: Edema present.     Left lower leg: Edema present.     Comments: 1+ edema bilateral feet to mid-shins; no abdominal ascites notable; left hip tender over trochanter, and into posterior buttock/ischium  Skin:    General: Skin is warm and dry.     Findings: Bruising present.  Neurological:     General: No focal deficit present.     Mental Status: She is alert and oriented to person, place, and time.     Cranial Nerves: No cranial nerve deficit.     Motor: No weakness.  Psychiatric:        Mood and Affect: Mood normal.        Behavior: Behavior normal.     Labs reviewed: Basic Metabolic Panel: Recent Labs    05/30/19 0424 05/30/19 0424 05/31/19 0500 05/31/19 0500 06/01/19 0239 06/11/19 0000 08/19/19 1127 08/19/19 1530 08/19/19 1731 08/20/19  2440 09/10/19 0844 10/08/19 0917  NA 139   < > 137   < > 140   < >   < >  --   --  139 140 140  K 4.0   < > 3.6   < > 3.6   < >   < >  --   --  3.9 3.7 3.6  CL 111   < > 110   < > 116*   < >   < >  --   --  106 108 107  CO2 20*   < > 19*   < > 18*   < >   < >  --   --  24 22 27   GLUCOSE 192*   < > 239*   < > 239*   < >   < >  --   --  148* 128* 137*  BUN 13   < > 15   < > 12   < >   < >  --   --  19 11 18   CREATININE 0.88   < > 0.93   < > 0.82   < >   < >  --   --  1.02* 1.00 0.91  CALCIUM 9.6   < > 9.1   < > 9.1   < >   < >  --   --  9.1 9.3 9.3   MG 2.0   < > 1.9  --  1.6*  --   --   --  2.1  --   --   --   PHOS 2.0*  --  3.3  --  3.5  --   --   --   --   --   --   --   TSH  --   --   --   --   --   --   --  3.704  --   --   --  3.96   < > = values in this interval not displayed.   Liver Function Tests: Recent Labs    05/27/19 0352 06/11/19 0000 08/19/19 1529 09/10/19 0844 10/08/19 0917  AST   < > 80* 63* 79* 55*  ALT   < > 42* 41 51* 35*  ALKPHOS  --  259* 144* 141*  --   BILITOT   < >  --  2.2* 1.7* 1.5*  PROT   < >  --  7.4 6.3* 6.2  ALBUMIN  --  2.7* 3.0* 2.6*  --    < > = values in this interval not displayed.   Recent Labs    04/06/19 1300 05/15/19 1042 05/26/19 1332  LIPASE 23 29 25    Recent Labs    05/15/19 1042 05/18/19 1456 05/26/19 1521  AMMONIA 111* 39* 41*   CBC: Recent Labs    06/01/19 0239 06/01/19 0239 06/11/19 0000 08/19/19 1127 08/21/19 0415 09/10/19 0826 10/08/19 0917  WBC 3.9*  --  5.0   < > 4.0 4.0 3.0*  NEUTROABS 2.2  --  3  --   --   --  1,027  HGB 12.2   < > 13.2   < > 12.2 12.7 12.2  HCT 35.4*   < > 38   < > 37.8 40.9 37.2  MCV 97.3  --   --    < > 94.7 96.5 89.9  PLT 82*   < > 125*   < > 110* 91*  103*   < > = values in this interval not displayed.   Lipid Panel: Recent Labs    08/20/19 0307 10/08/19 0917  CHOL 128 150  HDL 58 68  LDLCALC 61 66  TRIG 44 76  CHOLHDL 2.2 2.2   Lab Results  Component Value Date   HGBA1C 6.9 (H) 10/08/2019    Procedures since last visit: IR Radiologist Eval & Mgmt  Result Date: 09/25/2019 Please refer to notes tab for details about interventional procedure. (Op Note)   Assessment/Plan 1. Left hip pain -obtain xrays later today -we'll call her with results -? Bursitis vs OA/DJD  2. Cirrhosis of liver with ascites, unspecified hepatic cirrhosis type ([place]) -appears this is well compensated at present but is having some increased edema in feet over past month and wt trended up so will do extra 69m lasix daily 3-4 hrs after  morning 47mand see if edema improves--if not, she's to call back - CBC with Differential/Platelet; Future - BASIC METABOLIC PANEL WITH GFR; Future - Hepatic function panel; Future  3. Controlled type 2 diabetes mellitus with hyperglycemia, with long-term current use of insulin (HCC) -excellent control now and lowest was 73, cont healthier diet and try to get hip straightened out so she can walk some, as well - Hemoglobin A1c; Future  4. Hyperlipidemia associated with type 2 diabetes mellitus (HCC) -cont current therapy, LDL at goal  5. Hypercalcemia - f/u lab before next visit -resume just one calcium tablet daily - BASIC METABOLIC PANEL WITH GFR; Future  Labs/tests ordered:  * No order type specified * Next appt:  Visit date not found   Roben Tatsch L. Tresean Mattix, D.O. [address] [address] [address] Phone (Mon-Fri 8am-5pm):  [phone] Call:  [phone] follow prompts after 5pm & weekends Office Phone:  [phone] Fax:  [phone]

## 2019-10-14 NOTE — Patient Instructions (Addendum)
Take extra lasix 67m (1/2 tab) daily for 3 days about 3-4 hrs after your morning lasix 477m    Resume just 60018malcium daily.   [place] [address] your left hip xrays and we'll call you with results.

## 2019-10-15 NOTE — Progress Notes (Signed)
The hip xrays show degenerative changes otherwise known as arthritis in the hip.  They do not evaluate how bad this is.  Her back degeneration is also mentioned which typically is what causes pain down the leg.  I do wonder if orthopedics might be able to put a shot in her joint since we are so limited with oral pain med options with her liver and kidney disease.  Is she willing to see them for a consult?

## 2019-10-15 NOTE — Addendum Note (Signed)
Addended by: Hollace Kinnier L on: 10/15/2019 11:15 AM   Modules accepted: Orders

## 2019-10-16 ENCOUNTER — Telehealth: Payer: Self-pay | Admitting: *Deleted

## 2019-10-16 NOTE — Telephone Encounter (Signed)
Patient notified and agreed.  

## 2019-10-16 NOTE — Telephone Encounter (Signed)
It looks like her visit with Dr. Ninfa Linden will be an initial consultation, not necessarily an injection in her hip at that time--he will do a full evaluation and maybe more imaging before he determines the best course of treatment.

## 2019-10-16 NOTE — Telephone Encounter (Signed)
Patient called and stated that she just wanted to let you know that the Extra 1/2 of Lasix is working.   Also, Patient wanted to know if you were able to consult with Dr. [name] about the steroid shot that she is scheduled for 10/30/19. Concerned about receiving it due to her liver Condition.   Please Advise.

## 2019-10-25 ENCOUNTER — Other Ambulatory Visit: Payer: Self-pay | Admitting: *Deleted

## 2019-10-25 ENCOUNTER — Telehealth: Payer: Self-pay

## 2019-10-25 MED ORDER — COLCHICINE 0.6 MG PO TABS: 0.6000 mg | ORAL_TABLET | Freq: Every day | ORAL | 0 refills | Status: DC

## 2019-10-25 NOTE — Telephone Encounter (Signed)
Let's continue the colchicine for 3 months and then stop.

## 2019-10-25 NOTE — Telephone Encounter (Signed)
Incoming call received from patient stating she received a rx for colchicine in the hospital and rx is about to run out.  Patient questions if this is suppose to be a long-term medication and if so she needs a new script sent to pharmacy on file (confirmed).  Patient would like a return call   Please advise

## 2019-10-25 NOTE — H&P (Signed)
[facility] History & Physical  Kelly Splinter MD  E. Wareing May 15, 2019   Chief Complaint: Confusion HPI: The patient is a 74 y.o. year-old w hx of HTN, IDDM, CAD sp CABG and non-alcoholic cirrhosis. She has esoph banding 75/10/25 complicated by abd pain/ dysphagia and hypotension and was admitted 11/23 for 2 days and Rx'd w/ IVF"s, holding diuretics, PPI/ carafate and she improved and dc'd on 11/25. She was readmitted on 11/28 for UGI bleeding. Had EGD 11/29 w/ injection of bleeding esoph ulcers and banding of varix x 1, however bleeding continued so pt went forTIPS procedure and coil embolization of her varices by IR on 11/30.  IV PPI/ octreotide were given. Bleeding subsided and pt was dc'd on 04/12/19.   Today pt brought to ED for gen'd weakness, inability to speak or walk, no focal weakness noted.  Here HR is 95- 102 in ED, BP's soft 101/46, RR 15- 20.  Labs showed Na 131, BUN 20 cr 1.1, alb 3.0, tbili 3.4, NH3 111, ast 62 and alt 45. The liver tests were not much different than recent testing including tibil and NH3 levels. Pt was given lactulose 20gm and rifaximin ordered by GI and we are called to admit patient.    Pt's family member is present and gives history. Says she has improved "a lot" since this morning.  Not sure what medications she takes, pt's husband and daughter are more involved.    Patient has no c/o at this time.     ROS - n/a       Past Medical History  Past Medical History:  Diagnosis Date  . A-fib ([place])    08/19/19 afib with RVR, spontaneously converted to SR  . Allergy   . Arthritis    neck  . Cataract    bilateral - MD monitoring cataracts  . CHF (congestive heart failure) ([place])   . Chronic kidney disease, stage I    DR OTTELIN  HX UTIS  . Cirrhosis ([place])   . Coronary artery disease    s/p CABG 01/29/16: LIMA-LAD, SVG-RI, SVG-dRCA  . Cramp of limb   . Diabetes mellitus   . Dysphagia, unspecified(787.20)   . Dysuria   . Epistaxis   .  GERD (gastroesophageal reflux disease)   . Hearing loss    wears hearing aids  . Heart murmur     DX FOR YEARS ASYMPTOMATIC  . Hepatic encephalopathy ([place])   . Lumbago   . Myocardial infarction ([place])    NSTEMI 01/27/16  . Neoplasm of uncertain behavior of skin   . Nonspecific elevation of levels of transaminase or lactic acid dehydrogenase (LDH)   . Osteoarthrosis, unspecified whether generalized or localized, unspecified site   . Other and unspecified hyperlipidemia    diet controlled  . Pain in joint, shoulder region   . Paresthesias 04/01/2015  . Postablative ovarian failure   . Trochanteric bursitis of left hip 12/15/2015  . Type 2 diabetes mellitus without complication ([place])   . Unspecified essential hypertension    no meds  . Wears glasses    Past Surgical History  Past Surgical History:  Procedure Laterality Date  . BACK SURGERY  06/2015  . BREAST BIOPSY Left   . CARDIAC CATHETERIZATION N/A 01/27/2016   Procedure: Left Heart Cath and Coronary Angiography;  Surgeon: Belva Crome, MD;  Location: [place] CV [facility];  Service: Cardiovascular;  Laterality: N/A;  . COLONOSCOPY  2012   Dr Lajoyce Corners.   [name] Kitchen  COLONOSCOPY WITH PROPOFOL N/A 07/07/2016   Procedure: COLONOSCOPY WITH PROPOFOL;  Surgeon: [name], MD;  Location: [facility];  Service: Endoscopy;  Laterality: N/A;  . CORONARY ARTERY BYPASS GRAFT N/A 01/28/2016   Procedure: CORONARY ARTERY BYPASS GRAFTING (CABG) x 3 USING RIGHT LEG GREATER SAPHENOUS VEIN GRAFT;  Surgeon: Melrose Nakayama, MD;  Location: [place];  Service: Open Heart Surgery;  Laterality: N/A;  . ENDOVEIN HARVEST OF GREATER SAPHENOUS VEIN Right 01/28/2016   Procedure: ENDOVEIN HARVEST OF GREATER SAPHENOUS VEIN;  Surgeon: Melrose Nakayama, MD;  Location: [place];  Service: Open Heart Surgery;  Laterality: Right;  . ESOPHAGEAL BANDING  03/28/2019   Procedure: ESOPHAGEAL BANDING;  Surgeon: [name], MD;  Location: [facility];  Service: Endoscopy;;  .  ESOPHAGEAL BANDING  04/07/2019   Procedure: ESOPHAGEAL BANDING;  Surgeon: Juanita Craver, MD;  Location: [facility] ENDOSCOPY;  Service: Endoscopy;;  . ESOPHAGOGASTRODUODENOSCOPY N/A 04/07/2019   Procedure: ESOPHAGOGASTRODUODENOSCOPY (EGD);  Surgeon: Juanita Craver, MD;  Location: [facility] ENDOSCOPY;  Service: Endoscopy;  Laterality: N/A;  . ESOPHAGOGASTRODUODENOSCOPY (EGD) WITH PROPOFOL N/A 07/07/2016   Procedure: ESOPHAGOGASTRODUODENOSCOPY (EGD) WITH PROPOFOL;  Surgeon: [name], MD;  Location: [facility];  Service: Endoscopy;  Laterality: N/A;  . ESOPHAGOGASTRODUODENOSCOPY (EGD) WITH PROPOFOL N/A 03/28/2019   Procedure: ESOPHAGOGASTRODUODENOSCOPY (EGD) WITH PROPOFOL;  Surgeon: [name], MD;  Location: [facility];  Service: Endoscopy;  Laterality: N/A;  . HEMOSTASIS CLIP PLACEMENT  04/07/2019   Procedure: HEMOSTASIS CLIP PLACEMENT;  Surgeon: Juanita Craver, MD;  Location: [place] ENDOSCOPY;  Service: Endoscopy;;  . IR ANGIOGRAM SELECTIVE EACH ADDITIONAL VESSEL  04/08/2019  . IR EMBO ART  VEN HEMORR LYMPH EXTRAV  INC GUIDE ROADMAPPING  04/08/2019  . IR PARACENTESIS  04/08/2019  . IR RADIOLOGIST EVAL & MGMT  06/13/2019  . IR RADIOLOGIST EVAL & MGMT  09/25/2019  . IR TIPS  04/08/2019  . MAXIMUM ACCESS (MAS)POSTERIOR LUMBAR INTERBODY FUSION (PLIF) 1 LEVEL Left 06/10/2015   Procedure: FOR MAXIMUM ACCESS (MAS) POSTERIOR LUMBAR INTERBODY FUSION (PLIF) LUMBAR THREE-FOUR EXTRAFORAMINAL MICRODISCECTOMY LUMBAR FIVE-SACRAL ONE LEFT;  Surgeon: Eustace Moore, MD;  Location: [place] NEURO ORS;  Service: Neurosurgery;  Laterality: Left;  . RADIOLOGY WITH ANESTHESIA N/A 04/08/2019   Procedure: RADIOLOGY WITH ANESTHESIA;  Surgeon: Radiologist, Medication, MD;  Location: [place];  Service: Radiology;  Laterality: N/A;  . SCLEROTHERAPY  04/07/2019   Procedure: SCLEROTHERAPY;  Surgeon: Juanita Craver, MD;  Location: [facility] ENDOSCOPY;  Service: Endoscopy;;  . TEE WITHOUT CARDIOVERSION N/A 01/28/2016   Procedure: TRANSESOPHAGEAL  ECHOCARDIOGRAM (TEE);  Surgeon: Melrose Nakayama, MD;  Location: [place];  Service: Open Heart Surgery;  Laterality: N/A;  . TUBAL LIGATION  1982   Dr Connye Burkitt  . UMBILICAL HERNIA REPAIR N/A 09/10/2019   Procedure: HERNIA REPAIR UMBILICAL ADULT;  Surgeon: Donnie Mesa, MD;  Location: [place];  Service: General;  Laterality: N/A;  . UPPER GASTROINTESTINAL ENDOSCOPY    . VAGINAL HYSTERECTOMY  1997   Dr Rande Lawman   Family History  Family History  Problem Relation Age of Onset  . Lung cancer Father   . Arthritis Sister   . Arthritis Brother   . Liver cancer Brother   . Heart disease Maternal Grandmother   . Heart disease Maternal Grandfather   . Heart disease Paternal Grandmother   . Heart disease Paternal Grandfather   . Breast cancer Mother   . Other Daughter        house fire  . Breast cancer Maternal Aunt   .  Breast cancer Paternal Aunt   . Colon cancer Neg Hx   . Esophageal cancer Neg Hx   . Rectal cancer Neg Hx   . Stomach cancer Neg Hx    Social History  reports that she has never smoked. She has never used smokeless tobacco. She reports that she does not drink alcohol and does not use drugs. Allergies  Allergies  Allergen Reactions  . Kiwi Extract Anaphylaxis  . Tdap [Tetanus-Diphth-Acell Pertussis] Swelling and Other (See Comments)    Swelling at injection site, gets very hot  . Statins Other (See Comments)    RHABDOMYOLYSIS  . Latex Itching, Dermatitis and Rash  . Tramadol Nausea And Vomiting   Home medications Prior to Admission medications   Medication Sig Start Date End Date Taking? Authorizing Provider  acetaminophen (TYLENOL) 500 MG tablet Take 500 mg by mouth at bedtime.    Yes [provider]  Biotin 10000 MCG TABS Take 10,000 mcg by mouth every morning.   Yes [provider]  Cyanocobalamin (VITAMIN B 12 PO) Take 1,000 mcg by mouth daily.     Yes [provider]  loratadine (CLARITIN) 10 MG tablet Take 10 mg by mouth daily as needed  for allergies.   Yes [provider]  MAGNESIUM PO Take 500 mg by mouth daily.    Yes [provider]  Multiple Vitamins-Minerals (MULTIVITAMIN WITH MINERALS) tablet Take 1 tablet by mouth daily.     Yes [provider]  Polyethyl Glycol-Propyl Glycol (SYSTANE OP) Place 1 drop into both eyes daily.    Yes [provider]  Probiotic Product (PROBIOTIC DAILY PO) Take 1 capsule by mouth daily. Digestive Advantage Probiotic   Yes [provider]  Amino Acids-Protein Hydrolys (FEEDING SUPPLEMENT, PRO-STAT SUGAR FREE 64,) LIQD Take 30 mLs by mouth 3 (three) times daily with meals.    [provider]  colchicine 0.6 MG tablet Take 1 tablet (0.6 mg total) by mouth daily. Discontinue after 3 more months 10/25/19   Reed, Tiffany L, DO  ezetimibe (ZETIA) 10 MG tablet Take one tablet by mouth once daily 07/12/19   Reed, Tiffany L, DO  furosemide (LASIX) 40 MG tablet Take 1 tablet (40 mg total) by mouth daily. 08/22/19   Kathyrn Drown D, NP  glucose blood test strip One Touch Ultra II strips. Use to test blood sugar three times daily. Dx: E11.65 10/14/19   Reed, Tiffany L, DO  HYDROcodone-acetaminophen (NORCO/VICODIN) 5-325 MG tablet Take 1 tablet by mouth every 6 (six) hours as needed for moderate pain. 09/10/19   Donnie Mesa, MD  insulin detemir (LEVEMIR FLEXTOUCH) 100 UNIT/ML FlexPen Inject 30 Units into the skin at bedtime.     [provider]  Insulin Pen Needle (BD PEN NEEDLE NANO U/F) 32G X 4 MM MISC USE THREE TIMES DAILY AS DIRECTED 10/14/19   Reed, Tiffany L, DO  Insulin Syringe-Needle U-100 (INSULIN SYRINGE 1CC/31GX5/16") 31G X 5/16" 1 ML MISC USE AS DIRECTED DAILY WITH LEVEMIR. Dx: E11.65 07/29/19   Reed, Tiffany L, DO  JARDIANCE 25 MG TABS tablet TAKE 1 TABLET BY MOUTH DAILY 06/28/19   Reed, Tiffany L, DO  lactulose (CHRONULAC) 10 GM/15ML solution TAKE 45 ML BY MOUTH THREE TIMES DAILY AS DIRECTED 09/16/19   [name], MD  metoprolol tartrate  (LOPRESSOR) 25 MG tablet Take 1 tablet (25 mg total) by mouth as needed (use as needed for HR greater than 110). 08/21/19   Tommie Raymond, NP  ondansetron [facility])  4 MG tablet Take 1 tablet (4 mg total) by mouth every 6 (six) hours as needed for nausea. 05/18/19   Desiree Hane, MD  pantoprazole (PROTONIX) 40 MG tablet Take 40 mg by mouth 2 (two) times daily.    [provider]  rifaximin (XIFAXAN) 550 MG TABS tablet Take 1 tablet (550 mg total) by mouth 2 (two) times daily. 06/05/19   [name], MD       Exam Gen thin pleasant tired appearing WF, not in distress No rash, cyanosis or gangrene Sclera anicteric, throat clear  No jvd or bruits Chest clear bilat to bases no rales or wheezing RRR no MRG, bounding LV Abd soft ntnd no mass, no hsm or ascites noted GUpurewick in place MS no joint effusions or deformity Ext no LE or UE edema, no wounds or ulcers Neuro is alert, Ox 3 and had gen weakness, but nonfocal    Home meds:  - furosemide 40/ spironolactone 50 bid  - lactulose 20gm tid  - ezetimibe 10 qd  - insulin detemir 20u hs/ novolog 12u -8u-12u ac /jardiance 86m qd  - prn's/ vitamins/ supplements     Assessment/ Plan: 1. General weakness - unable to ambulate, speak. Has had poor po intake, not sure if taking her home lactulose.  Prob combination of dehydration / hepatic enceph.  Looks better already after lactulose here.  Will start IVF's as well. Hold diuretics for now 2. Cirrhosis/ hepatic enceph - home lactulose resumed by GI and rifaximin ordered which is new. apprec GI consult 3. Recent GI bleed/ esoph ulcers/ esoph varices - recent variceal banding 11/19, then admit for GIB w/ esoph ulcers and 1 varix treated 11/29 , then had TIPS and variceal embolization 11/30.   4. IDDM - use SSI here for now and give some of detemir dose 5. Dehydration - could use some IVF"s, NS at 75 cc/hr for now w/ 750cc bolus 6. CAD sp CABG      RKelly Splinter MD   [facility] 05/15/2019, 1:10 PM

## 2019-10-25 NOTE — Telephone Encounter (Signed)
RX sent, spoke with patient to inform her of Dr.Reed's response, patient verbalized understanding

## 2019-10-25 NOTE — Patient Outreach (Signed)
[facility]) Care Management  10/25/2019  [name] 1946-04-24 [id number]   Call placed to member's son to follow up on management of current condition.  State she is doing well, was see by PCP on 6/7, next visit scheduled for 10/7.  Report blood sugars have been well managed, ranging 120-150's, low of 70 over the last couple weeks.  State her Novolog has been discontinued currently, they will continue to monitor her blood sugars daily.  She has been having some hip discomfort due to arthritis, appointment with ortho on 6/23.  No current issues with liver condition, visit scheduled with GI on 7/2.  Denies any urgent concerns at this time, will follow up within the next month.  [facility] CM Care Plan Problem One     Most Recent Value  Care Plan Problem One Risk for readmission related to abnormal HR as evidenced by Recent hospitalizations  Role Documenting the Problem One Care Management [place] for Problem One Active  [facility] Long Term Goal  Member and family will verbalize understanding of management of chronic medical conditions over the next 45 days  [facility] Long Term Goal Start Date 10/25/19  Interventions for Problem One Long Term Goal Family educated on care plan and management of liver condition and diabetes.  Follow up appointments reviewed with son.  [facility] CM Short Term Goal #1  Member will keep and attend follow up appointment with cardiology within the next 3 weeks  [facility] CM Short Term Goal #1 Start Date 09/25/19  [Date reset]  [facility] CM Short Term Goal #1 Met Date 10/25/19  [facility] CM Short Term Goal #2  Member and family will report compliance with medications over the next 4 weeks  [facility] CM Short Term Goal #2 Start Date 10/25/19  Interventions for Short Term Goal #2 Medication changes reviewed with son, advised of importance of following instructions in effort to decrease risk of medication error and ED visit     [name], Therapist, sports, MSN [place] Manager [phone]

## 2019-10-30 ENCOUNTER — Encounter: Payer: Self-pay | Admitting: Orthopaedic Surgery

## 2019-10-30 ENCOUNTER — Other Ambulatory Visit: Payer: Self-pay

## 2019-10-30 ENCOUNTER — Ambulatory Visit (INDEPENDENT_AMBULATORY_CARE_PROVIDER_SITE_OTHER): Payer: Medicare Other | Admitting: Orthopaedic Surgery

## 2019-10-30 DIAGNOSIS — M25552 Pain in left hip: Secondary | ICD-10-CM

## 2019-10-30 NOTE — Progress Notes (Signed)
Office Visit Note   Patient: [name]           Date of Birth: 16-Feb-1946           MRN: [id number] Visit Date: 10/30/2019              Requested by: Gayland Curry, DO [place],  [place] [address] PCP: Gayland Curry, DO   Assessment & Plan: Visit Diagnoses:  1. Pain in left hip     Plan: Based on her clinical exam findings I feel that this is not a hip issue but is more of an issue of the lumbar spine.  We will work on referring her to Dr. Sherley Bounds who did her previous L3-L4 PLIF.  I believe that she is probably developed some type of impingement below this level and it may need an intervention such as an epidural steroid injection.  We will see what her operative spine surgeon thinks.  All questions and concerns were answered and addressed.  She agrees with this referral.  Follow-Up Instructions: Return if symptoms worsen or fail to improve.   Orders:  No orders of the defined types were placed in this encounter.  No orders of the defined types were placed in this encounter.     Procedures: No procedures performed   Clinical Data: No additional findings.   Subjective: Chief Complaint  Patient presents with  . Left Hip - Pain  The patient is someone I am seeing for the first time as a new referral.  This is for left hip pain.  However she points to her low back on the left side a source of her pain with no groin pain and no true hip pain.  She does have a history of a PLIF done by Dr. Sherley Bounds several years ago.  This was at the L3-L4 level.  She does have recent plain films of her hip on the canopy system for me to review.  She states that she stands for long period of time she gets a lot of low back pain that radiates into the back of her left thigh.  She denies any numbness and tingling in her feet or weakness in her legs but she states that she cannot walk a long period time due to this low back pain to the left side.  Again she describes it  as a hip pain but she points to her lower back to the left side.  She denies any change in bowel bladder function.  She denies any acute change in her medical status.  She has chronic cirrhosis but not from alcohol abuse.  HPI  Review of Systems She currently denies any headache or chest pain.  She denies any fever, chills, nausea, vomiting  Objective: Vital Signs: LMP  (LMP Unknown)   Physical Exam She is alert and oriented and in no acute distress.  She is a thin individual.  She is slow to move. Ortho Exam Examination of both hips show both hips are normal on my exam.  She has fluid and full range of motion of both hips.  There is no pain in the pelvis on compression of the hips.  There is no pain with stressing either hip.  She has good strength in bilateral lower extremities.  There is no pain of the trochanteric area of either hip. Specialty Comments:  No specialty comments available.  Imaging: No results found. 2 views of the left hip also  showed the SI joint.  The SI joint and the hip itself appeared normal with no significant arthritic changes at all.  The joint space is well-maintained on the left side.  PMFS History: Patient Active Problem List   Diagnosis Date Noted  . Atrial fibrillation with rapid ventricular response ([place]) 08/19/2019  . S/P TIPS (transjugular intrahepatic portosystemic shunt)   . Hypercalcemia 05/26/2019  . AKI (acute kidney injury) ([place])   . Hyponatremia   . Acute lower UTI 05/16/2019  . Acute delirium   . General weakness 05/15/2019  . Encephalopathy, hepatic ([place]) 05/15/2019  . Volume depletion 05/15/2019  . Bleeding esophageal varices ([place])   . UGI bleed   . Infarction of spleen 04/06/2019  . Odynophagia 04/01/2019  . Dysphagia   . Hepatoma ([place]) 01/31/2019  . Coronary artery disease of native artery of native heart with stable angina pectoris ([place]) 01/31/2019  . Thrombocytopenia ([place]) 09/25/2017  . Left forearm pain 05/29/2017  . Portal  vein thrombosis 04/22/2017  . Skin cancer of nose 08/23/2016  . History of colonic polyps   . Benign neoplasm of rectum   . Esophageal varices without bleeding ([place])   . Portal hypertensive gastropathy ([place])   . Leg cramps 05/18/2016  . Routine lab draw 05/13/2016  . Iron deficiency anemia 05/09/2016  . Statin-induced rhabdomyolysis 05/07/2016  . Liver cirrhosis ([place]) 04/05/2016  . PVC's (premature ventricular contractions) 04/05/2016  . CAD (coronary artery disease) 02/16/2016  . S/P CABG x 3 02/02/2016  . History of non-ST elevation myocardial infarction (NSTEMI) 01/27/2016  . DM (diabetes mellitus), type 2, uncontrolled ([place]) 12/15/2015  . Trochanteric bursitis of left hip 12/15/2015  . S/P lumbar spinal fusion 06/10/2015  . Paresthesia 04/01/2015  . Osteoporosis 03/31/2015  . Hearing loss 06/25/2014  . Insomnia 06/25/2014  . Hair thinning 06/25/2014  . Muscle spasm 10/08/2013  . Biceps tendon rupture, proximal 11/24/2012  . Hyperlipemia   . GERD (gastroesophageal reflux disease)   . DM (diabetes mellitus) ([place])   . Essential hypertension    Past Medical History:  Diagnosis Date  . A-fib ([place])    08/19/19 afib with RVR, spontaneously converted to SR  . Allergy   . Arthritis    neck  . Cataract    bilateral - MD monitoring cataracts  . CHF (congestive heart failure) ([place])   . Chronic kidney disease, stage I    DR OTTELIN  HX UTIS  . Cirrhosis ([place])   . Coronary artery disease    s/p CABG 01/29/16: LIMA-LAD, SVG-RI, SVG-dRCA  . Cramp of limb   . Diabetes mellitus   . Dysphagia, unspecified(787.20)   . Dysuria   . Epistaxis   . GERD (gastroesophageal reflux disease)   . Hearing loss    wears hearing aids  . Heart murmur     DX FOR YEARS ASYMPTOMATIC  . Hepatic encephalopathy ([place])   . Lumbago   . Myocardial infarction ([place])    NSTEMI 01/27/16  . Neoplasm of uncertain behavior of skin   . Nonspecific elevation of levels of transaminase or lactic acid  dehydrogenase (LDH)   . Osteoarthrosis, unspecified whether generalized or localized, unspecified site   . Other and unspecified hyperlipidemia    diet controlled  . Pain in joint, shoulder region   . Paresthesias 04/01/2015  . Postablative ovarian failure   . Trochanteric bursitis of left hip 12/15/2015  . Type 2 diabetes mellitus without complication ([place])   . Unspecified essential hypertension    no meds  .  Wears glasses     Family History  Problem Relation Age of Onset  . Lung cancer Father   . Arthritis Sister   . Arthritis Brother   . Liver cancer Brother   . Heart disease Maternal Grandmother   . Heart disease Maternal Grandfather   . Heart disease Paternal Grandmother   . Heart disease Paternal Grandfather   . Breast cancer Mother   . Other Daughter        house fire  . Breast cancer Maternal Aunt   . Breast cancer Paternal Aunt   . Colon cancer Neg Hx   . Esophageal cancer Neg Hx   . Rectal cancer Neg Hx   . Stomach cancer Neg Hx     Past Surgical History:  Procedure Laterality Date  . BACK SURGERY  06/2015  . BREAST BIOPSY Left   . CARDIAC CATHETERIZATION N/A 01/27/2016   Procedure: Left Heart Cath and Coronary Angiography;  Surgeon: Belva Crome, MD;  Location: [facility];  Service: Cardiovascular;  Laterality: N/A;  . COLONOSCOPY  2012   Dr Lajoyce Corners.   . COLONOSCOPY WITH PROPOFOL N/A 07/07/2016   Procedure: COLONOSCOPY WITH PROPOFOL;  Surgeon: [name], MD;  Location: [facility];  Service: Endoscopy;  Laterality: N/A;  . CORONARY ARTERY BYPASS GRAFT N/A 01/28/2016   Procedure: CORONARY ARTERY BYPASS GRAFTING (CABG) x 3 USING RIGHT LEG GREATER SAPHENOUS VEIN GRAFT;  Surgeon: Melrose Nakayama, MD;  Location: [facility];  Service: Open Heart Surgery;  Laterality: N/A;  . ENDOVEIN HARVEST OF GREATER SAPHENOUS VEIN Right 01/28/2016   Procedure: ENDOVEIN HARVEST OF GREATER SAPHENOUS VEIN;  Surgeon: Melrose Nakayama, MD;  Location: [facility];  Service: Open  Heart Surgery;  Laterality: Right;  . ESOPHAGEAL BANDING  03/28/2019   Procedure: ESOPHAGEAL BANDING;  Surgeon: [name], MD;  Location: [facility];  Service: Endoscopy;;  . ESOPHAGEAL BANDING  04/07/2019   Procedure: ESOPHAGEAL BANDING;  Surgeon: Juanita Craver, MD;  Location[facility];  Service: Endoscopy;;  . ESOPHAGOGASTRODUODENOSCOPY N/A 04/07/2019   Procedure: ESOPHAGOGASTRODUODENOSCOPY (EGD);  Surgeon: Juanita Craver, MD;  Location: [facility] ENDOSCOPY;  Service: Endoscopy;  Laterality: N/A;  . ESOPHAGOGASTRODUODENOSCOPY (EGD) WITH PROPOFOL N/A 07/07/2016   Procedure: ESOPHAGOGASTRODUODENOSCOPY (EGD) WITH PROPOFOL;  Surgeon: [name], MD;  Location: [facility];  Service: Endoscopy;  Laterality: N/A;  . ESOPHAGOGASTRODUODENOSCOPY (EGD) WITH PROPOFOL N/A 03/28/2019   Procedure: ESOPHAGOGASTRODUODENOSCOPY (EGD) WITH PROPOFOL;  Surgeon: [name], MD;  Location: [facility];  Service: Endoscopy;  Laterality: N/A;  . HEMOSTASIS CLIP PLACEMENT  04/07/2019   Procedure: HEMOSTASIS CLIP PLACEMENT;  Surgeon: Juanita Craver, MD;  Location: [facility];  Service: Endoscopy;;  . IR ANGIOGRAM SELECTIVE EACH ADDITIONAL VESSEL  04/08/2019  . IR EMBO ART  VEN HEMORR LYMPH EXTRAV  INC GUIDE ROADMAPPING  04/08/2019  . IR PARACENTESIS  04/08/2019  . IR RADIOLOGIST EVAL & MGMT  06/13/2019  . IR RADIOLOGIST EVAL & MGMT  09/25/2019  . IR TIPS  04/08/2019  . MAXIMUM ACCESS (MAS)POSTERIOR LUMBAR INTERBODY FUSION (PLIF) 1 LEVEL Left 06/10/2015   Procedure: FOR MAXIMUM ACCESS (MAS) POSTERIOR LUMBAR INTERBODY FUSION (PLIF) LUMBAR THREE-FOUR EXTRAFORAMINAL MICRODISCECTOMY LUMBAR FIVE-SACRAL ONE LEFT;  Surgeon: Eustace Moore, MD;  Location: [facility];  Service: Neurosurgery;  Laterality: Left;  . RADIOLOGY WITH ANESTHESIA N/A 04/08/2019   Procedure: RADIOLOGY WITH ANESTHESIA;  Surgeon: Radiologist, Medication, MD;  Location: [facility];  Service: Radiology;  Laterality: N/A;  . SCLEROTHERAPY  04/07/2019  Procedure: SCLEROTHERAPY;  Surgeon: Juanita Craver, MD;  Location: [facility] ENDOSCOPY;  Service: Endoscopy;;  . TEE WITHOUT CARDIOVERSION N/A 01/28/2016   Procedure: TRANSESOPHAGEAL ECHOCARDIOGRAM (TEE);  Surgeon: Melrose Nakayama, MD;  Location: [facility];  Service: Open Heart Surgery;  Laterality: N/A;  . TUBAL LIGATION  1982   Dr Connye Burkitt  . UMBILICAL HERNIA REPAIR N/A 09/10/2019   Procedure: HERNIA REPAIR UMBILICAL ADULT;  Surgeon: Donnie Mesa, MD;  Location: [facility];  Service: General;  Laterality: N/A;  . UPPER GASTROINTESTINAL ENDOSCOPY    . VAGINAL HYSTERECTOMY  1997   Dr Rande Lawman   Social History   Occupational History  . Occupation: retired  Tobacco Use  . Smoking status: Never Smoker  . Smokeless tobacco: Never Used  Vaping Use  . Vaping Use: Never used  Substance and Sexual Activity  . Alcohol use: No  . Drug use: No  . Sexual activity: Yes    Partners: Male    Birth control/protection: Post-menopausal, Surgical    Comment: Hysterectomy

## 2019-11-06 ENCOUNTER — Other Ambulatory Visit: Payer: Self-pay | Admitting: Internal Medicine

## 2019-11-06 DIAGNOSIS — Z1231 Encounter for screening mammogram for malignant neoplasm of breast: Secondary | ICD-10-CM

## 2019-11-08 ENCOUNTER — Other Ambulatory Visit (INDEPENDENT_AMBULATORY_CARE_PROVIDER_SITE_OTHER): Payer: Medicare Other

## 2019-11-08 ENCOUNTER — Encounter: Payer: Self-pay | Admitting: Gastroenterology

## 2019-11-08 ENCOUNTER — Ambulatory Visit (INDEPENDENT_AMBULATORY_CARE_PROVIDER_SITE_OTHER): Payer: Medicare Other | Admitting: Gastroenterology

## 2019-11-08 VITALS — BP 120/71 | HR 70 | Ht 62.0 in | Wt 137.2 lb

## 2019-11-08 DIAGNOSIS — K7460 Unspecified cirrhosis of liver: Secondary | ICD-10-CM

## 2019-11-08 DIAGNOSIS — R188 Other ascites: Secondary | ICD-10-CM

## 2019-11-08 LAB — CBC WITH DIFFERENTIAL/PLATELET
Basophils Absolute: 0 10*3/uL (ref 0.0–0.1)
Basophils Relative: 1 % (ref 0.0–3.0)
Eosinophils Absolute: 0.1 10*3/uL (ref 0.0–0.7)
Eosinophils Relative: 1.9 % (ref 0.0–5.0)
HCT: 38.1 % (ref 36.0–46.0)
Hemoglobin: 12.9 g/dL (ref 12.0–15.0)
Lymphocytes Relative: 22.8 % (ref 12.0–46.0)
Lymphs Abs: 0.8 10*3/uL (ref 0.7–4.0)
MCHC: 33.9 g/dL (ref 30.0–36.0)
MCV: 88.6 fl (ref 78.0–100.0)
Monocytes Absolute: 0.5 10*3/uL (ref 0.1–1.0)
Monocytes Relative: 13.7 % — ABNORMAL HIGH (ref 3.0–12.0)
Neutro Abs: 2.1 10*3/uL (ref 1.4–7.7)
Neutrophils Relative %: 60.6 % (ref 43.0–77.0)
Platelets: 98 10*3/uL — ABNORMAL LOW (ref 150.0–400.0)
RBC: 4.3 Mil/uL (ref 3.87–5.11)
RDW: 14.9 % (ref 11.5–15.5)
WBC: 3.5 10*3/uL — ABNORMAL LOW (ref 4.0–10.5)

## 2019-11-08 LAB — COMPREHENSIVE METABOLIC PANEL
ALT: 36 U/L — ABNORMAL HIGH (ref 0–35)
AST: 55 U/L — ABNORMAL HIGH (ref 0–37)
Albumin: 3.4 g/dL — ABNORMAL LOW (ref 3.5–5.2)
Alkaline Phosphatase: 149 U/L — ABNORMAL HIGH (ref 39–117)
BUN: 21 mg/dL (ref 6–23)
CO2: 29 mEq/L (ref 19–32)
Calcium: 9.9 mg/dL (ref 8.4–10.5)
Chloride: 102 mEq/L (ref 96–112)
Creatinine, Ser: 0.89 mg/dL (ref 0.40–1.20)
GFR: 62.18 mL/min (ref >60.00)
Glucose, Bld: 141 mg/dL — ABNORMAL HIGH (ref 70–99)
Potassium: 3.6 mEq/L (ref 3.5–5.1)
Sodium: 140 mEq/L (ref 135–145)
Total Bilirubin: 1.8 mg/dL — ABNORMAL HIGH (ref 0.2–1.2)
Total Protein: 6.9 g/dL (ref 6.0–8.3)

## 2019-11-08 LAB — PROTIME-INR
INR: 1.1 ratio — ABNORMAL HIGH (ref 0.8–1.0)
Prothrombin Time: 11.9 s (ref 9.6–13.1)

## 2019-11-08 MED ORDER — LACTULOSE 10 GM/15ML PO SOLN: ORAL | 1 refills | Status: DC

## 2019-11-08 NOTE — Progress Notes (Signed)
Review of pertinent gastrointestinal problems: 1. Personal history of adenomatous colon polyps. Colonoscopy Dr. [name] 2012 August was normal except for 2 subcentimeter polyps, one was a tubular adenoma. She was recommended to have recall colonoscopy at five-year interval. She had also had previous colonoscopies with Dr. Lajoyce Corners and found to have adenomas as well.Colonoscopy Dr. [name] 07/2016 one subCM TA, recall at 5 years 2. Cirrhosis: likely from Fatty liver  Paracentesis 11/20174 liters, elevated SAAG, no SBP, cytology neg for malignancy  imaging:03/2017 [place]: cirrhosis, splenomegaly, NEW partial (non-occlusive) PV thrombus, Gallstones and new "mild dilation of intra and extrahepatic bile ducts"; MRI 03/2017 biliary findings probably from extensive periportal fibrosis, small gallstones in GB, non-occlusive PV thrombus appears chronic.US 10/2017: gallstones, thick GB, cirrhosis without masses in liver.04/2018 ultrasound cirrhosis without focal hepatic lesions.  MRI January 2021 showed a new 3 cm mass that was "indeterminate for neoplasm".  Interventional radiology review felt this was possibly a biloma.  Follow-up MRI April 2021 arranged by interventional radiology  Most recent AFP:1/2021normal  MELD-NA;04/2018 labs : 10  07/2016 EGD: large varices, + portal gastropathy; started nadolol after discussion with cardiology(nadolol 78m once dialy); no recall necessary since on Nadolol.  EGD November 2020 showed persistent large varices, banded with ligating bands.  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  Very large varices causing dysphagia despite 80 of nadolol daily. EGD November 2020 placement of 14 variceal ligating bands. This was complicated by significant chest pain and dysphagia afterwards requiring hospital admission. Further  complicated by bleeding from band ulcer site and eventual TIPS placement November 2020.   HPI: This is a very pleasant 74year old woman who is here with her husband today.  I last saw her about 5 months ago.  She was doing fairly well at that time after quite a lot of instability for the 2 or 3 months previous.  She was going to continue on Xifaxan 550 twice daily and lactulose 3 times daily.  She had an indeterminant liver lesion and we are planning repeat MRI in April 2021.  That follow-up MRI was very reassuring.  Blood work 1 month ago June 2021 showed a normal CBC except platelets 103, normal complete metabolic profile except for AST 55, ALT 35, total bilirubin 1.5, albumin 3.0.  Her weight is down 2 pounds since her last office visit here, same scale, 5 months ago.  She is really doing very well overall.  She is taking lactulose 45 mL once daily instead of 3 times daily.  She continues Xifaxan 550 twice daily.  On this regimen she does not appear to have any encephalopathy.  She has no ascites and only trace lower extremity edema bilaterally.  She had an umbilical hernia repair since I saw her last and was discharged the same day, no decompensation of her liver disease clinically at least.  ROS: complete GI ROS as described in HPI, all other review negative.  Constitutional:  No unintentional weight loss   Past Medical History:  Diagnosis Date  . A-fib ([place]    08/19/19 afib with RVR, spontaneously converted to SR  . Allergy   . Arthritis    neck  . Cataract    bilateral - MD monitoring cataracts  . CHF (congestive heart failure) ([place]   . Chronic kidney disease, stage I    DR OTTELIN  HX UTIS  . Cirrhosis ([place]   .  Coronary artery disease    s/p CABG 01/29/16: LIMA-LAD, SVG-RI, SVG-dRCA  . Cramp of limb   . Diabetes mellitus   . Dysphagia, unspecified(787.20)   . Dysuria   . Epistaxis   . GERD (gastroesophageal reflux disease)   . Hearing loss    wears hearing aids   . Heart murmur     DX FOR YEARS ASYMPTOMATIC  . Hepatic encephalopathy ([place])   . Lumbago   . Myocardial infarction ([place])    NSTEMI 01/27/16  . Neoplasm of uncertain behavior of skin   . Nonspecific elevation of levels of transaminase or lactic acid dehydrogenase (LDH)   . Osteoarthrosis, unspecified whether generalized or localized, unspecified site   . Other and unspecified hyperlipidemia    diet controlled  . Pain in joint, shoulder region   . Paresthesias 04/01/2015  . Postablative ovarian failure   . Trochanteric bursitis of left hip 12/15/2015  . Type 2 diabetes mellitus without complication ([place])   . Unspecified essential hypertension    no meds  . Wears glasses     Past Surgical History:  Procedure Laterality Date  . BACK SURGERY  06/2015  . BREAST BIOPSY Left   . CARDIAC CATHETERIZATION N/A 01/27/2016   Procedure: Left Heart Cath and Coronary Angiography;  Surgeon: Belva Crome, MD;  Location: [place] [facility];  Service: Cardiovascular;  Laterality: N/A;  . COLONOSCOPY  2012   Dr Lajoyce Corners.   . COLONOSCOPY WITH PROPOFOL N/A 07/07/2016   Procedure: COLONOSCOPY WITH PROPOFOL;  Surgeon: [name], MD;  Location: [facility];  Service: Endoscopy;  Laterality: N/A;  . CORONARY ARTERY BYPASS GRAFT N/A 01/28/2016   Procedure: CORONARY ARTERY BYPASS GRAFTING (CABG) x 3 USING RIGHT LEG GREATER SAPHENOUS VEIN GRAFT;  Surgeon: Melrose Nakayama, MD;  Location: [place];  Service: Open Heart Surgery;  Laterality: N/A;  . ENDOVEIN HARVEST OF GREATER SAPHENOUS VEIN Right 01/28/2016   Procedure: ENDOVEIN HARVEST OF GREATER SAPHENOUS VEIN;  Surgeon: Melrose Nakayama, MD;  Location: [facility];  Service: Open Heart Surgery;  Laterality: Right;  . ESOPHAGEAL BANDING  03/28/2019   Procedure: ESOPHAGEAL BANDING;  Surgeon: [name], MD;  Location: [facility];  Service: Endoscopy;;  . ESOPHAGEAL BANDING  04/07/2019   Procedure: ESOPHAGEAL BANDING;  Surgeon: Juanita Craver, MD;  Location:  [facility] ENDOSCOPY;  Service: Endoscopy;;  . ESOPHAGOGASTRODUODENOSCOPY N/A 04/07/2019   Procedure: ESOPHAGOGASTRODUODENOSCOPY (EGD);  Surgeon: Juanita Craver, MD;  Location: [facility] ENDOSCOPY;  Service: Endoscopy;  Laterality: N/A;  . ESOPHAGOGASTRODUODENOSCOPY (EGD) WITH PROPOFOL N/A 07/07/2016   Procedure: ESOPHAGOGASTRODUODENOSCOPY (EGD) WITH PROPOFOL;  Surgeon: [name], MD;  [facility];  Service: Endoscopy;  Laterality: N/A;  . ESOPHAGOGASTRODUODENOSCOPY (EGD) WITH PROPOFOL N/A 03/28/2019   Procedure: ESOPHAGOGASTRODUODENOSCOPY (EGD) WITH PROPOFOL;  Surgeon: [name], MD;  [facility]: [facility];  Service: Endoscopy;  Laterality: N/A;  . HEMOSTASIS CLIP PLACEMENT  04/07/2019   Procedure: HEMOSTASIS CLIP PLACEMENT;  Surgeon: Juanita Craver, MD;  Location: [place] ENDOSCOPY;  Service: Endoscopy;;  . IR ANGIOGRAM SELECTIVE EACH ADDITIONAL VESSEL  04/08/2019  . IR EMBO ART  VEN HEMORR LYMPH EXTRAV  INC GUIDE ROADMAPPING  04/08/2019  . IR PARACENTESIS  04/08/2019  . IR RADIOLOGIST EVAL & MGMT  06/13/2019  . IR RADIOLOGIST EVAL & MGMT  09/25/2019  . IR TIPS  04/08/2019  . MAXIMUM ACCESS (MAS)POSTERIOR LUMBAR INTERBODY FUSION (PLIF) 1 LEVEL Left 06/10/2015   Procedure: FOR MAXIMUM ACCESS (MAS) POSTERIOR LUMBAR INTERBODY FUSION (PLIF) LUMBAR THREE-FOUR EXTRAFORAMINAL  MICRODISCECTOMY LUMBAR FIVE-SACRAL ONE LEFT;  Surgeon: Eustace Moore, MD;  Location: [place] [facility];  Service: Neurosurgery;  Laterality: Left;  . RADIOLOGY WITH ANESTHESIA N/A 04/08/2019   Procedure: RADIOLOGY WITH ANESTHESIA;  Surgeon: Radiologist, Medication, MD;  Location: [place];  Service: Radiology;  Laterality: N/A;  . SCLEROTHERAPY  04/07/2019   Procedure: SCLEROTHERAPY;  Surgeon: Juanita Craver, MD;  Location: [facility] ENDOSCOPY;  Service: Endoscopy;;  . TEE WITHOUT CARDIOVERSION N/A 01/28/2016   Procedure: TRANSESOPHAGEAL ECHOCARDIOGRAM (TEE);  Surgeon: Melrose Nakayama, MD;  Location: [facility];  Service: Open Heart Surgery;   Laterality: N/A;  . TUBAL LIGATION  1982   Dr Connye Burkitt  . UMBILICAL HERNIA REPAIR N/A 09/10/2019   Procedure: HERNIA REPAIR UMBILICAL ADULT;  Surgeon: Donnie Mesa, MD;  Location: [place];  Service: General;  Laterality: N/A;  . UPPER GASTROINTESTINAL ENDOSCOPY    . VAGINAL HYSTERECTOMY  1997   Dr Rande Lawman    Current Outpatient Medications  Medication Sig Dispense Refill  . acetaminophen (TYLENOL) 500 MG tablet Take 500 mg by mouth at bedtime.     . Amino Acids-Protein Hydrolys (FEEDING SUPPLEMENT, PRO-STAT SUGAR FREE 64,) LIQD Take 30 mLs by mouth 3 (three) times daily with meals.    . Biotin 10000 MCG TABS Take 10,000 mcg by mouth every morning.    . colchicine 0.6 MG tablet Take 1 tablet (0.6 mg total) by mouth daily. Discontinue after 3 more months 90 tablet 0  . Cyanocobalamin (VITAMIN B 12 PO) Take 1,000 mcg by mouth daily.      [name] Kitchen ezetimibe (ZETIA) 10 MG tablet Take one tablet by mouth once daily 90 tablet 1  . furosemide (LASIX) 40 MG tablet Take 1 tablet (40 mg total) by mouth daily. (Patient taking differently: Take 40 mg by mouth daily. Patient takes 40 mg in the morning and 20 mg at night) 60 tablet 2  . glucose blood test strip One Touch Ultra II strips. Use to test blood sugar three times daily. Dx: E11.65 300 each 3  . insulin detemir (LEVEMIR FLEXTOUCH) 100 UNIT/ML FlexPen Inject 30 Units into the skin at bedtime.     . Insulin Pen Needle (BD PEN NEEDLE NANO U/F) 32G X 4 MM MISC USE THREE TIMES DAILY AS DIRECTED 100 each 6  . Insulin Syringe-Needle U-100 (INSULIN SYRINGE 1CC/31GX5/16") 31G X 5/16" 1 ML MISC USE AS DIRECTED DAILY WITH LEVEMIR. Dx: E11.65 100 each 2  . JARDIANCE 25 MG TABS tablet TAKE 1 TABLET BY MOUTH DAILY 90 tablet 1  . lactulose (CHRONULAC) 10 GM/15ML solution TAKE 45 ML BY MOUTH THREE TIMES DAILY AS DIRECTED 4257 mL 1  . loratadine (CLARITIN) 10 MG tablet Take 10 mg by mouth daily as needed for allergies.    [name] Kitchen MAGNESIUM PO Take 500 mg by mouth daily.     .  metoprolol tartrate (LOPRESSOR) 25 MG tablet Take 1 tablet (25 mg total) by mouth as needed (use as needed for HR greater than 110). 60 tablet 2  . Multiple Vitamins-Minerals (MULTIVITAMIN WITH MINERALS) tablet Take 1 tablet by mouth daily.      . ondansetron (ZOFRAN) 4 MG tablet Take 1 tablet (4 mg total) by mouth every 6 (six) hours as needed for nausea. 20 tablet 0  . pantoprazole (PROTONIX) 40 MG tablet Take 40 mg by mouth 2 (two) times daily.    Vladimir Faster Glycol-Propyl Glycol (SYSTANE OP) Place 1 drop into both eyes daily.     . Probiotic Product (PROBIOTIC DAILY  PO) Take 1 capsule by mouth daily. Digestive Advantage Probiotic    . rifaximin (XIFAXAN) 550 MG TABS tablet Take 1 tablet (550 mg total) by mouth 2 (two) times daily. 60 tablet 1   No current facility-administered medications for this visit.    Allergies as of 11/08/2019 - Review Complete 11/08/2019  Allergen Reaction Noted  . Kiwi extract Anaphylaxis 05/28/2015  . Tdap [tetanus-diphth-acell pertussis] Swelling and Other (See Comments) 03/27/2013  . Statins Other (See Comments) 05/08/2016  . Latex Itching, Dermatitis, and Rash 12/22/2010  . Tramadol Nausea And Vomiting 05/28/2015    Family History  Problem Relation Age of Onset  . Lung cancer Father   . Arthritis Sister   . Arthritis Brother   . Liver cancer Brother   . Heart disease Maternal Grandmother   . Heart disease Maternal Grandfather   . Heart disease Paternal Grandmother   . Heart disease Paternal Grandfather   . Breast cancer Mother   . Other Daughter        house fire  . Breast cancer Maternal Aunt   . Breast cancer Paternal Aunt   . Colon cancer Neg Hx   . Esophageal cancer Neg Hx   . Rectal cancer Neg Hx   . Stomach cancer Neg Hx     Social History   Socioeconomic History  . Marital status: Married    Spouse name: Not on file  . Number of children: 6  . Years of education: Not on file  . Highest education level: Not on file  Occupational  History  . Occupation: retired  Tobacco Use  . Smoking status: Never Smoker  . Smokeless tobacco: Never Used  Vaping Use  . Vaping Use: Never used  Substance and Sexual Activity  . Alcohol use: No  . Drug use: No  . Sexual activity: Yes    Partners: Male    Birth control/protection: Post-menopausal, Surgical    Comment: Hysterectomy  Other Topics Concern  . Not on file  Social History Narrative   She is retired she is married and lives with her husband   6 children   No alcohol or tobacco never smoker no drug use   Social Determinants of Radio broadcast assistant Strain:   . Difficulty of Paying Living Expenses:   Food Insecurity: No Food Insecurity  . Worried About Charity fundraiser in the Last Year: Never true  . Ran Out of Food in the Last Year: Never true  Transportation Needs: No Transportation Needs  . Lack of Transportation (Medical): No  . Lack of Transportation (Non-Medical): No  Physical Activity:   . Days of Exercise per Week:   . Minutes of Exercise per Session:   Stress:   . Feeling of Stress :   Social Connections:   . Frequency of Communication with Friends and Family:   . Frequency of Social Gatherings with Friends and Family:   . Attends Religious Services:   . Active Member of Clubs or Organizations:   . Attends Archivist Meetings:   [name] Kitchen Marital Status:   Intimate Partner Violence:   . Fear of Current or Ex-Partner:   . Emotionally Abused:   [name] Kitchen Physically Abused:   . Sexually Abused:      Physical Exam: BP 120/71   Pulse 70   Ht 5' 2"  (1.575 m)   Wt 137 lb 4 oz (62.3 kg)   LMP  (LMP Unknown)   BMI 25.10 kg/m  Constitutional: generally well-appearing  Psychiatric: alert and oriented x3 Abdomen: soft, nontender, nondistended, no obvious ascites, no peritoneal signs, normal bowel sounds No peripheral edema noted in lower extremities  Assessment and plan: 74 y.o. female with decompensated cirrhosis, likely burned-out  [place].  She is doing very well clinically.  Her encephalopathy is under very good control on Xifaxan 550 twice daily as well as lactulose once daily.  She is having 1-2 BMs daily.  She has no lower extremity edema.  No overt bleeding.  I recommended no changes in any of her medicines.  She is going to get basic blood work today including a CBC, complete metabolic profile and coags to restage her liver disease and recalculate meld score.  I would like to see her back in the office in about 3 months.  Sooner if needed.  Please see the "Patient Instructions" section for addition details about the plan.  Owens Loffler, MD [facility] 11/08/2019, 10:06 AM   Total time on date of encounter was 25 minutes (this included time spent preparing to see the patient reviewing records; obtaining and/or reviewing separately obtained history; performing a medically appropriate exam and/or evaluation; counseling and educating the patient and family if present; ordering medications, tests or procedures if applicable; and documenting clinical information in the health record).

## 2019-11-08 NOTE — Patient Instructions (Addendum)
Your provider has requested that you go to the basement level for lab work before leaving today. Press "B" on the elevator. The lab is located at the first door on the left as you exit the elevator.  If you are age 73 or older, your body mass index should be between 23-30. Your Body mass index is 25.1 kg/m. If this is out of the aforementioned range listed, please consider follow up with your Primary Care Provider.  If you are age 25 or younger, your body mass index should be between 19-25. Your Body mass index is 25.1 kg/m. If this is out of the aformentioned range listed, please consider follow up with your Primary Care Provider.    You will need a follow-up in 3 months. Office will call to schedule appointment.   Thank you for choosing me and [facility].  Dr. [name]

## 2019-11-13 ENCOUNTER — Telehealth: Payer: Self-pay | Admitting: Gastroenterology

## 2019-11-13 NOTE — Telephone Encounter (Signed)
Patient calling for lab results

## 2019-11-13 NOTE — Telephone Encounter (Signed)
Dr [name] have you reviewed the pt labs from 7/2?

## 2019-11-14 NOTE — Telephone Encounter (Signed)
Recall office visit entered

## 2019-11-14 NOTE — Telephone Encounter (Signed)
Thanks, I sent her a my chart message about her labs just now.  She needs return office visit with me in 3 months

## 2019-11-19 ENCOUNTER — Other Ambulatory Visit: Payer: Self-pay | Admitting: Neurological Surgery

## 2019-11-19 DIAGNOSIS — M48062 Spinal stenosis, lumbar region with neurogenic claudication: Secondary | ICD-10-CM

## 2019-11-22 ENCOUNTER — Ambulatory Visit
Admission: RE | Admit: 2019-11-22 | Discharge: 2019-11-22 | Disposition: A | Discharge status: Home / Self Care | Payer: Medicare Other | Source: Ambulatory Visit | Attending: Internal Medicine | Admitting: Internal Medicine

## 2019-11-22 ENCOUNTER — Other Ambulatory Visit: Payer: Self-pay

## 2019-11-22 DIAGNOSIS — Z1231 Encounter for screening mammogram for malignant neoplasm of breast: Secondary | ICD-10-CM | POA: Diagnosis not present

## 2019-11-23 ENCOUNTER — Other Ambulatory Visit: Payer: Self-pay | Admitting: Internal Medicine

## 2019-11-25 NOTE — Telephone Encounter (Signed)
Reason for call: verify dose and instructions. Per patient she is taking Protonix 40 mg 1 by mouth daily

## 2019-11-27 ENCOUNTER — Other Ambulatory Visit: Payer: Self-pay | Admitting: Internal Medicine

## 2019-11-27 DIAGNOSIS — R928 Other abnormal and inconclusive findings on diagnostic imaging of breast: Secondary | ICD-10-CM

## 2019-11-28 ENCOUNTER — Other Ambulatory Visit: Payer: Self-pay | Admitting: *Deleted

## 2019-11-28 NOTE — Patient Outreach (Signed)
[facility]) Care Management  11/28/2019  [name] May 12, 1945 [id number]    Monthly call placed to member's son [name] to follow up on management of chronic medical conditions, no answer.  HIPAA compliant voice message left.  Will follow up within the next 3-4 business days.  [name], [place], MSN [place] [phone]

## 2019-12-03 ENCOUNTER — Other Ambulatory Visit: Payer: Self-pay | Admitting: *Deleted

## 2019-12-03 NOTE — Patient Outreach (Signed)
[facility]) Care Management  12/03/2019  JUANNA PUDLO 08/13/1945 [id number]   Outreach attempt #2, unsuccessful.  Monthly call placed to member's son [name] to follow up on management of chronic medical conditions, no answer.  HIPAA compliant voice message left.  Will send unsuccessful outreach letter and follow up within the next 3-4 business days.  [name], [place], MSN [place] [phone]

## 2019-12-04 ENCOUNTER — Ambulatory Visit
Admission: RE | Admit: 2019-12-04 | Discharge: 2019-12-04 | Disposition: A | Discharge status: Home / Self Care | Payer: Medicare Other | Source: Ambulatory Visit | Attending: Internal Medicine | Admitting: Internal Medicine

## 2019-12-04 ENCOUNTER — Other Ambulatory Visit: Payer: Self-pay | Admitting: Internal Medicine

## 2019-12-04 ENCOUNTER — Other Ambulatory Visit: Payer: Self-pay

## 2019-12-04 DIAGNOSIS — N6489 Other specified disorders of breast: Secondary | ICD-10-CM | POA: Diagnosis not present

## 2019-12-04 DIAGNOSIS — R928 Other abnormal and inconclusive findings on diagnostic imaging of breast: Secondary | ICD-10-CM

## 2019-12-09 ENCOUNTER — Other Ambulatory Visit: Payer: Self-pay | Admitting: *Deleted

## 2019-12-09 NOTE — Patient Outreach (Signed)
[facility]) Care Management  12/09/2019  [name] Mar 01, 1946 [id number]   Outreach attempt #3, successful.  Call placed to son [name] to follow up on management of chronic medical conditions.  He report member has continued to improve, was able to go out of town this past weekend to visit family.  She is still having some hip/back pain and will have follow up with back specialist.  Has MRI scheduled for 8/9 first, then will have office visit scheduled.  He report there was also an area of concern noticed on her last mammogram, will have biopsy done on 8/11.  Otherwise denies any urgent concerns.  Agrees to follow up within the next month, advised to contact this care manager with questions.  Goals Addressed              This Visit's Progress   .  Per son: Continue to get stronger (pt-stated)        CARE PLAN ENTRY (see longitudinal plan of care for additional care plan information)  Current Barriers:  . Cognitive Deficits  Nurse Case Manager Clinical Goal(s):  [name] Kitchen Over the next 45 days, patient will not experience hospital admission. Hospital Admissions in last 6 months = 2 . Over the next 28 days, patient will attend all scheduled medical appointments: MRI & biopsy . Over the next 28 days, patient will demonstrate improved health management independence as evidenced byability to continue to perform ADLs  Interventions:  . Inter-disciplinary care team collaboration (see longitudinal plan of care) . Discussed plans with patient for ongoing care management follow up and provided patient with direct contact information for care management team . Reviewed scheduled/upcoming provider appointments including:   Patient Self Care Activities:  . Attends all scheduled provider appointments . Performs ADL's independently   Initial goal documentation       [name], RN, MSN [place] [phone]

## 2019-12-13 ENCOUNTER — Telehealth: Payer: Self-pay | Admitting: Gastroenterology

## 2019-12-13 NOTE — Telephone Encounter (Signed)
The pt wanted to make [place] aware that she is going to get the COVID vaccine.  Dr Jac Canavan

## 2019-12-16 ENCOUNTER — Other Ambulatory Visit: Payer: Self-pay

## 2019-12-16 ENCOUNTER — Ambulatory Visit
Admission: RE | Admit: 2019-12-16 | Discharge: 2019-12-16 | Disposition: A | Discharge status: Home / Self Care | Payer: Medicare Other | Source: Ambulatory Visit | Attending: Neurological Surgery | Admitting: Neurological Surgery

## 2019-12-16 DIAGNOSIS — M48061 Spinal stenosis, lumbar region without neurogenic claudication: Secondary | ICD-10-CM | POA: Diagnosis not present

## 2019-12-16 DIAGNOSIS — M48062 Spinal stenosis, lumbar region with neurogenic claudication: Secondary | ICD-10-CM

## 2019-12-18 ENCOUNTER — Other Ambulatory Visit: Payer: Self-pay

## 2019-12-18 ENCOUNTER — Ambulatory Visit
Admission: RE | Admit: 2019-12-18 | Discharge: 2019-12-18 | Disposition: A | Discharge status: Home / Self Care | Payer: Medicare Other | Source: Ambulatory Visit | Attending: Internal Medicine | Admitting: Internal Medicine

## 2019-12-18 ENCOUNTER — Other Ambulatory Visit: Payer: Self-pay | Admitting: Internal Medicine

## 2019-12-18 ENCOUNTER — Other Ambulatory Visit (HOSPITAL_COMMUNITY): Payer: Self-pay | Admitting: Diagnostic Radiology

## 2019-12-18 DIAGNOSIS — R928 Other abnormal and inconclusive findings on diagnostic imaging of breast: Secondary | ICD-10-CM

## 2019-12-18 DIAGNOSIS — D242 Benign neoplasm of left breast: Secondary | ICD-10-CM | POA: Diagnosis not present

## 2019-12-18 DIAGNOSIS — N6325 Unspecified lump in the left breast, overlapping quadrants: Secondary | ICD-10-CM | POA: Diagnosis not present

## 2019-12-18 HISTORY — PX: BREAST BIOPSY: SHX20

## 2019-12-19 ENCOUNTER — Other Ambulatory Visit: Payer: Self-pay | Admitting: *Deleted

## 2019-12-19 MED ORDER — LEVEMIR FLEXTOUCH 100 UNIT/ML ~~LOC~~ SOPN: 30.0000 [IU] | PEN_INJECTOR | Freq: Every day | SUBCUTANEOUS | 1 refills | Status: DC

## 2019-12-19 NOTE — Telephone Encounter (Signed)
Patient requested refill

## 2019-12-23 ENCOUNTER — Other Ambulatory Visit: Payer: Self-pay | Admitting: Internal Medicine

## 2019-12-23 DIAGNOSIS — E1169 Type 2 diabetes mellitus with other specified complication: Secondary | ICD-10-CM

## 2019-12-23 DIAGNOSIS — E785 Hyperlipidemia, unspecified: Secondary | ICD-10-CM

## 2019-12-26 DIAGNOSIS — M48062 Spinal stenosis, lumbar region with neurogenic claudication: Secondary | ICD-10-CM | POA: Diagnosis not present

## 2020-01-09 ENCOUNTER — Other Ambulatory Visit: Payer: Self-pay | Admitting: *Deleted

## 2020-01-09 NOTE — Patient Outreach (Signed)
[facility]) Care Management  01/09/2020  Walburga Hudman 1945-06-07 [id number]   Call placed to member's son, [name], to follow up on management of chronic medical conditions and test results.  He report she is well, results from breast biopsy was negative, no evidence of cancer.  She has also had the MRI of her lumbar and spine, degenerative disease noted, will follow up with ortho specialist.  He report she will be receiving injections to help with the pain, appointment scheduled for 9/22.  He denies any urgent concerns at this time, encouraged to contact this care manager with questions.  Will follow up within the next week.  Goals Addressed              This Visit's Progress   .  Per son: Continue to get stronger (pt-stated)   On track     [place] (see longitudinal plan of care for additional care plan information)  Current Barriers:  . Cognitive Deficits  Nurse Case Manager Clinical Goal(s):  [name] Kitchen Over the next 45 days, patient will not experience hospital admission. Hospital Admissions in last 6 months = 2 . Over the next 28 days, patient will attend all scheduled medical appointments: MRI & biopsy . Over the next 28 days, patient will demonstrate improved health management independence as evidenced byability to continue to perform ADLs    Interventions:  . Inter-disciplinary care team collaboration (see longitudinal plan of care) . Discussed plans with patient for ongoing care management follow up and provided patient with direct contact information for care management team . Reviewed scheduled/upcoming provider appointments including:   Patient Self Care Activities:  . Attends all scheduled provider appointments . Performs ADL's independently          [name], RN, MSN [place] [phone]

## 2020-01-10 ENCOUNTER — Other Ambulatory Visit: Payer: Self-pay | Admitting: Internal Medicine

## 2020-01-10 DIAGNOSIS — E1169 Type 2 diabetes mellitus with other specified complication: Secondary | ICD-10-CM

## 2020-01-10 DIAGNOSIS — E785 Hyperlipidemia, unspecified: Secondary | ICD-10-CM

## 2020-01-19 ENCOUNTER — Other Ambulatory Visit: Payer: Self-pay | Admitting: Gastroenterology

## 2020-01-29 DIAGNOSIS — R030 Elevated blood-pressure reading, without diagnosis of hypertension: Secondary | ICD-10-CM | POA: Diagnosis not present

## 2020-01-29 DIAGNOSIS — M48062 Spinal stenosis, lumbar region with neurogenic claudication: Secondary | ICD-10-CM | POA: Diagnosis not present

## 2020-02-03 ENCOUNTER — Other Ambulatory Visit (INDEPENDENT_AMBULATORY_CARE_PROVIDER_SITE_OTHER): Payer: Medicare Other | Admitting: *Deleted

## 2020-02-03 ENCOUNTER — Other Ambulatory Visit: Payer: Self-pay

## 2020-02-03 DIAGNOSIS — K7460 Unspecified cirrhosis of liver: Secondary | ICD-10-CM | POA: Diagnosis not present

## 2020-02-03 DIAGNOSIS — Z23 Encounter for immunization: Secondary | ICD-10-CM | POA: Diagnosis not present

## 2020-02-03 DIAGNOSIS — R188 Other ascites: Secondary | ICD-10-CM | POA: Diagnosis not present

## 2020-02-03 DIAGNOSIS — I25118 Atherosclerotic heart disease of native coronary artery with other forms of angina pectoris: Secondary | ICD-10-CM | POA: Diagnosis not present

## 2020-02-03 DIAGNOSIS — Z794 Long term (current) use of insulin: Secondary | ICD-10-CM | POA: Diagnosis not present

## 2020-02-03 DIAGNOSIS — E1165 Type 2 diabetes mellitus with hyperglycemia: Secondary | ICD-10-CM | POA: Diagnosis not present

## 2020-02-04 LAB — CBC WITH DIFFERENTIAL/PLATELET
Absolute Monocytes: 526 cells/uL (ref 200–950)
Basophils Absolute: 50 cells/uL (ref 0–200)
Basophils Relative: 1.4 %
Eosinophils Absolute: 187 cells/uL (ref 15–500)
Eosinophils Relative: 5.2 %
HCT: 38.8 % (ref 35.0–45.0)
Hemoglobin: 13.1 g/dL (ref 11.7–15.5)
Lymphs Abs: 792 cells/uL — ABNORMAL LOW (ref 850–3900)
MCH: 30.3 pg (ref 27.0–33.0)
MCHC: 33.8 g/dL (ref 32.0–36.0)
MCV: 89.6 fL (ref 80.0–100.0)
MPV: 12.7 fL — ABNORMAL HIGH (ref 7.5–12.5)
Monocytes Relative: 14.6 %
Neutro Abs: 2045 cells/uL (ref 1500–7800)
Neutrophils Relative %: 56.8 %
Platelets: 102 10*3/uL — ABNORMAL LOW (ref 140–400)
RBC: 4.33 10*6/uL (ref 3.80–5.10)
RDW: 14.1 % (ref 11.0–15.0)
Total Lymphocyte: 22 %
WBC: 3.6 10*3/uL — ABNORMAL LOW (ref 3.8–10.8)

## 2020-02-04 LAB — HEMOGLOBIN A1C
Hgb A1c MFr Bld: 9.2 % of total Hgb — ABNORMAL HIGH (ref <5.7)
Mean Plasma Glucose: 217 (calc)
eAG (mmol/L): 12 (calc)

## 2020-02-04 LAB — HEPATIC FUNCTION PANEL
AG Ratio: 1.1 (calc) (ref 1.0–2.5)
ALT: 35 U/L — ABNORMAL HIGH (ref 6–29)
AST: 46 U/L — ABNORMAL HIGH (ref 10–35)
Albumin: 3.3 g/dL — ABNORMAL LOW (ref 3.6–5.1)
Alkaline phosphatase (APISO): 119 U/L (ref 37–153)
Bilirubin, Direct: 0.5 mg/dL — ABNORMAL HIGH (ref 0.0–0.2)
Globulin: 3 g/dL (calc) (ref 1.9–3.7)
Indirect Bilirubin: 1 mg/dL (calc) (ref 0.2–1.2)
Total Bilirubin: 1.5 mg/dL — ABNORMAL HIGH (ref 0.2–1.2)
Total Protein: 6.3 g/dL (ref 6.1–8.1)

## 2020-02-04 LAB — LIPID PANEL
Cholesterol: 161 mg/dL (ref <200)
HDL: 76 mg/dL (ref >=50)
LDL Cholesterol (Calc): 69 mg/dL (calc)
Non-HDL Cholesterol (Calc): 85 mg/dL (calc) (ref <130)
Total CHOL/HDL Ratio: 2.1 (calc) (ref <5.0)
Triglycerides: 77 mg/dL (ref <150)

## 2020-02-04 NOTE — Progress Notes (Signed)
Cholesterol is at goal Sugar average is nearly as bad as her husband's again Liver tests are fairly stable She continues to have low white blood cells and platelets likely all related to her liver disease

## 2020-02-10 ENCOUNTER — Other Ambulatory Visit: Payer: Self-pay | Admitting: *Deleted

## 2020-02-10 NOTE — Patient Outreach (Signed)
[place] [facility]) Care Management  02/10/2020  [name] 27-Feb-1946 [id number]   Call placed to member's on [name] to follow up on member's status.  He report her back pain is better since getting the injection however her blood sugars are not slightly increased.  A1C was 6.9, increased to 9.2 on 9/27.  He state they are still working member's adherence to recommended diet but she does follow most of the time.  No urgent concerns, will see PCP on 10/7, family will provide transportation.  Encouraged to contact this care manger with questions, will follow up within the next month.  Goals Addressed              This Visit's Progress   .  COMPLETED: Per son: Continue to get stronger (pt-stated)        CARE PLAN ENTRY (see longitudinal plan of care for additional care plan information)  Current Barriers:  . Cognitive Deficits  Nurse Case Manager Clinical Goal(s):  [name] Kitchen Over the next 45 days, patient will not experience hospital admission. Hospital Admissions in last 6 months = 2 . Over the next 28 days, patient will attend all scheduled medical appointments: MRI & biopsy . Over the next 28 days, patient will demonstrate improved health management independence as evidenced byability to continue to perform ADLs    Interventions:  . Inter-disciplinary care team collaboration (see longitudinal plan of care) . Discussed plans with patient for ongoing care management follow up and provided patient with direct contact information for care management team . Reviewed scheduled/upcoming provider appointments including:   Patient Self Care Activities:  . Attends all scheduled provider appointments . Performs ADL's independently        .  [facility] - Make and Keep All Appointments        Follow Up Date 11/2   - ask family or friend for a ride - keep a calendar with appointment dates    Why is this important?   Part of staying healthy is seeing the doctor for follow-up  care.  If you forget your appointments, there are some things you can do to stay on track.    Notes: PCP scheduled for 10/7    .  [facility] - Monitor and Manage My Blood Sugar        Follow Up Date 11/2   - check blood sugar at prescribed times - check blood sugar if I feel it is too high or too low - take the blood sugar log to all doctor visits    Why is this important?   Checking your blood sugar at home helps to keep it from getting very high or very low.  Writing the results in a diary or log helps the doctor know how to care for you.  Your blood sugar log should have the time, date and the results.  Also, write down the amount of insulin or other medicine that you take.  Other information, like what you ate, exercise done and how you were feeling, will also be helpful.     Notes:     .  [facility] - Set My Target A1C        Follow Up Date 05/09/2020   - set target A1C - less than 8    Why is this important?   Your target A1C is decided together by you and your doctor.  It is based on several things like your age and other health issues.    Notes:  [name], [place], MSN [place] [phone]

## 2020-02-13 ENCOUNTER — Other Ambulatory Visit: Payer: Self-pay

## 2020-02-13 ENCOUNTER — Encounter: Payer: Self-pay | Admitting: Internal Medicine

## 2020-02-13 ENCOUNTER — Ambulatory Visit (INDEPENDENT_AMBULATORY_CARE_PROVIDER_SITE_OTHER): Payer: Medicare Other | Admitting: Internal Medicine

## 2020-02-13 VITALS — BP 116/62 | HR 62 | Temp 97.7°F

## 2020-02-13 DIAGNOSIS — E785 Hyperlipidemia, unspecified: Secondary | ICD-10-CM

## 2020-02-13 DIAGNOSIS — D696 Thrombocytopenia, unspecified: Secondary | ICD-10-CM

## 2020-02-13 DIAGNOSIS — R188 Other ascites: Secondary | ICD-10-CM

## 2020-02-13 DIAGNOSIS — I25118 Atherosclerotic heart disease of native coronary artery with other forms of angina pectoris: Secondary | ICD-10-CM | POA: Diagnosis not present

## 2020-02-13 DIAGNOSIS — E1165 Type 2 diabetes mellitus with hyperglycemia: Secondary | ICD-10-CM

## 2020-02-13 DIAGNOSIS — K7460 Unspecified cirrhosis of liver: Secondary | ICD-10-CM | POA: Diagnosis not present

## 2020-02-13 DIAGNOSIS — Z794 Long term (current) use of insulin: Secondary | ICD-10-CM | POA: Diagnosis not present

## 2020-02-13 DIAGNOSIS — L989 Disorder of the skin and subcutaneous tissue, unspecified: Secondary | ICD-10-CM | POA: Diagnosis not present

## 2020-02-13 DIAGNOSIS — E1169 Type 2 diabetes mellitus with other specified complication: Secondary | ICD-10-CM | POA: Diagnosis not present

## 2020-02-13 MED ORDER — FREESTYLE LIBRE 14 DAY SENSOR MISC: 1.0000 [IU] | Freq: Two times a day (BID) | 4 refills | Status: DC

## 2020-02-13 MED ORDER — FREESTYLE LIBRE 14 DAY READER DEVI: 1.0000 | Freq: Two times a day (BID) | 0 refills | Status: DC

## 2020-02-13 NOTE — Progress Notes (Signed)
Location:  [facility] clinic Provider:  Breean Nannini L. Mariea Clonts, D.O., C.M.D.  Code Status: DNR Goals of Care:  Advanced Directives 02/13/2020  Does Patient Have a Medical Advance Directive? Yes  Type of Advance Directive Out of facility DNR (pink MOST or yellow form)  Does patient want to make changes to medical advance directive? No - Patient declined  Would patient like information on creating a medical advance directive? -     Chief Complaint  Patient presents with  . Medical Management of Chronic Issues    4 month follow   . Health Maintenance    Covid, eye exam, foot exam and urin Micro     HPI: Patient is a 74 y.o. female seen today for medical management of chronic diseases.    She's doing ok.  She feels like she is past that period of time where she kept getting ill.  She reports adherence to her diet.    She had an epidural on her back which may have increased her sugar--she had it a few days prior to her labs.  She's been eating the same and all.  Had been 6.9 nd now 9.2.  Discussed that the hba1c measures the average over 3 mos so her epidural cannot be completely to blame. CBGs--took after breakfast this morning and 140.  Usually fasting is 101 or so, not up to 140.  Lowest lately has been 98.  Has levemir 30 units at night On Jardiance 49m  Novolog had caused lows all the time and it was stopped during a hospitalization.  Intake has gotten better since then so she may require some again.  We don't have postprandial readings to review.  Discussed freestyle libre as ideal for her so we could assess these without her having to stick her finger 3-4 times a day.    Right ankle just a little swollen but her fluid has been out of her since the last hospitalization.    Has a place on her clavicle area--putting neosporin on it and covering with a bandaid--getting better but there a while which concerns her.  Discussed it may be a skin cancer and dermatology should evaluate.    Got her  flu shot 9/27.    She's concerned about her liver and that's why she's afraid to take the covid vaccine.  GI also recommended it for her but she is still not convinced.  Her husband is not taking it b/c she isn't.  Discussed that the risks of covid are far worse than the risks of a side effect from the vaccine.  Given information about where to get the vaccine.    Past Medical History:  Diagnosis Date  . A-fib ([place]    08/19/19 afib with RVR, spontaneously converted to SR  . Allergy   . Arthritis    neck  . Cataract    bilateral - MD monitoring cataracts  . CH[place] (congestive heart failure) ([facility]   . Chronic kidney disease, stage I    DR OTTELIN  HX UTIS  . Cirrhosis ([place]   . Coronary artery disease    s/p CABG 01/29/16: LIMA-LAD, SVG-RI, SVG-dRCA  . Cramp of limb   . Diabetes mellitus   . Dysphagia, unspecified(787.20)   . Dysuria   . Epistaxis   . GERD (gastroesophageal reflux disease)   . Hearing loss    wears hearing aids  . Heart murmur     DX FOR YEARS ASYMPTOMATIC  . Hepatic encephalopathy ([place]   .  Lumbago   . Myocardial infarction ([place])    NSTEMI 01/27/16  . Neoplasm of uncertain behavior of skin   . Nonspecific elevation of levels of transaminase or lactic acid dehydrogenase (LDH)   . Osteoarthrosis, unspecified whether generalized or localized, unspecified site   . Other and unspecified hyperlipidemia    diet controlled  . Pain in joint, shoulder region   . Paresthesias 04/01/2015  . Postablative ovarian failure   . Trochanteric bursitis of left hip 12/15/2015  . Type 2 diabetes mellitus without complication ([place])   . Unspecified essential hypertension    no meds  . Wears glasses     Past Surgical History:  Procedure Laterality Date  . BACK SURGERY  06/2015  . BREAST BIOPSY Left   . CARDIAC CATHETERIZATION N/A 01/27/2016   Procedure: Left Heart Cath and Coronary Angiography;  Surgeon: Belva Crome, MD;  Location: [place] [facility];  Service: Cardiovascular;   Laterality: N/A;  . COLONOSCOPY  2012   Dr Lajoyce Corners.   . COLONOSCOPY WITH PROPOFOL N/A 07/07/2016   Procedure: COLONOSCOPY WITH PROPOFOL;  Surgeon: [name], MD;  Location: [facility];  Service: Endoscopy;  Laterality: N/A;  . CORONARY ARTERY BYPASS GRAFT N/A 01/28/2016   Procedure: CORONARY ARTERY BYPASS GRAFTING (CABG) x 3 USING RIGHT LEG GREATER SAPHENOUS VEIN GRAFT;  Surgeon: Melrose Nakayama, MD;  Location: [place];  Service: Open Heart Surgery;  Laterality: N/A;  . ENDOVEIN HARVEST OF GREATER SAPHENOUS VEIN Right 01/28/2016   Procedure: ENDOVEIN HARVEST OF GREATER SAPHENOUS VEIN;  Surgeon: Melrose Nakayama, MD;  Location: [place];  Service: Open Heart Surgery;  Laterality: Right;  . ESOPHAGEAL BANDING  03/28/2019   Procedure: ESOPHAGEAL BANDING;  Surgeon: [name], MD;  Location: [facility];  Service: Endoscopy;;  . ESOPHAGEAL BANDING  04/07/2019   Procedure: ESOPHAGEAL BANDING;  Surgeon: Juanita Craver, MD;  Location: [facility];  Service: Endoscopy;;  . ESOPHAGOGASTRODUODENOSCOPY N/A 04/07/2019   Procedure: ESOPHAGOGASTRODUODENOSCOPY (EGD);  Surgeon: Juanita Craver, MD;  Location: [facility] ENDOSCOPY;  Service: Endoscopy;  Laterality: N/A;  . ESOPHAGOGASTRODUODENOSCOPY (EGD) WITH PROPOFOL N/A 07/07/2016   Procedure: ESOPHAGOGASTRODUODENOSCOPY (EGD) WITH PROPOFOL;  Surgeon: [name], MD;  [facility]: [facility];  Service: Endoscopy;  Laterality: N/A;  . ESOPHAGOGASTRODUODENOSCOPY (EGD) WITH PROPOFOL N/A 03/28/2019   Procedure: ESOPHAGOGASTRODUODENOSCOPY (EGD) WITH PROPOFOL;  Surgeon: [name], MD;  Location: [facility];  Service: Endoscopy;  Laterality: N/A;  . HEMOSTASIS CLIP PLACEMENT  04/07/2019   Procedure: HEMOSTASIS CLIP PLACEMENT;  Surgeon: Juanita Craver, MD;  Location: [place] ENDOSCOPY;  Service: Endoscopy;;  . IR ANGIOGRAM SELECTIVE EACH ADDITIONAL VESSEL  04/08/2019  . IR EMBO ART  VEN HEMORR LYMPH EXTRAV  INC GUIDE ROADMAPPING  04/08/2019  . IR PARACENTESIS   04/08/2019  . IR RADIOLOGIST EVAL & MGMT  06/13/2019  . IR RADIOLOGIST EVAL & MGMT  09/25/2019  . IR TIPS  04/08/2019  . MAXIMUM ACCESS (MAS)POSTERIOR LUMBAR INTERBODY FUSION (PLIF) 1 LEVEL Left 06/10/2015   Procedure: FOR MAXIMUM ACCESS (MAS) POSTERIOR LUMBAR INTERBODY FUSION (PLIF) LUMBAR THREE-FOUR EXTRAFORAMINAL MICRODISCECTOMY LUMBAR FIVE-SACRAL ONE LEFT;  Surgeon: Eustace Moore, MD;  Location: [place] [facility]URO [facility]S;  Service: Neurosurgery;  Laterality: Left;  . RADIOLOGY WITH ANESTHESIA N/A 04/08/2019   Procedure: RADIOLOGY WITH ANESTHESIA;  Surgeon: Radiologist, Medication, MD;  Location: [place];  Service: Radiology;  Laterality: N/A;  . SCLEROTHERAPY  04/07/2019   Procedure: SCLEROTHERAPY;  Surgeon: Juanita Craver, MD;  Location: [facility] ENDOSCOPY;  Service: Endoscopy;;  .  TEE WITHOUT CARDIOVERSION N/A 01/28/2016   Procedure: TRANSESOPHAGEAL ECHOCARDIOGRAM (TEE);  Surgeon: Melrose Nakayama, MD;  Location: [place];  Service: Open Heart Surgery;  Laterality: N/A;  . TUBAL LIGATION  1982   Dr Connye Burkitt  . UMBILICAL HERNIA REPAIR N/A 09/10/2019   Procedure: HERNIA REPAIR UMBILICAL ADULT;  Surgeon: Donnie Mesa, MD;  Location: [place];  Service: General;  Laterality: N/A;  . UPPER GASTROINTESTINAL ENDOSCOPY    . VAGINAL HYSTERECTOMY  1997   Dr Rande Lawman    Allergies  Allergen Reactions  . Kiwi Extract Anaphylaxis  . Tdap [Tetanus-Diphth-Acell Pertussis] Swelling and Other (See Comments)    Swelling at injection site, gets very hot  . Statins Other (See Comments)    RHABDOMYOLYSIS  . Latex Itching, Dermatitis and Rash  . Tramadol Nausea And Vomiting    Outpatient Encounter Medications as of 02/13/2020  Medication Sig  . acetaminophen (TYLENOL) 500 MG tablet Take 500 mg by mouth at bedtime.   . Amino Acids-Protein Hydrolys (FEEDING SUPPLEMENT, PRO-STAT SUGAR FREE 64,) LIQD Take 30 mLs by mouth 3 (three) times daily with meals.  . Biotin 10000 MCG TABS Take 10,000 mcg by mouth every morning.  . colchicine 0.6  MG tablet Take 1 tablet (0.6 mg total) by mouth daily. Discontinue after 3 more months  . Cyanocobalamin (VITAMIN B 12 PO) Take 1,000 mcg by mouth daily.    [name] Kitchen ezetimibe (ZETIA) 10 MG tablet TAKE 1 TABLET BY MOUTH EVERY DAY  . furosemide (LASIX) 40 MG tablet Take 1 tablet (40 mg total) by mouth daily.  [name] Kitchen glucose blood test strip One Touch Ultra II strips. Use to test blood sugar three times daily. Dx: E11.65  . insulin detemir (LEVEMIR FLEXTOUCH) 100 UNIT/ML FlexPen Inject 30 Units into the skin at bedtime.  . Insulin Pen Needle (BD PEN NEEDLE NANO U/F) 32G X 4 MM MISC USE THREE TIMES DAILY AS DIRECTED  . Insulin Syringe-Needle U-100 (INSULIN SYRINGE 1CC/31GX5/16") 31G X 5/16" 1 ML MISC USE AS DIRECTED DAILY WITH LEVEMIR. Dx: E11.65  . JARDIANCE 25 MG TABS tablet TAKE 1 TABLET BY MOUTH DAILY  . lactulose (CHRONULAC) 10 GM/15ML solution TAKE 45 ML BY MOUTH THREE TIMES DAILY AS DIRECTED  . loratadine (CLARITIN) 10 MG tablet Take 10 mg by mouth daily as needed for allergies.  [name] Kitchen MAGNESIUM PO Take 500 mg by mouth daily.   . metoprolol tartrate (LOPRESSOR) 25 MG tablet Take 1 tablet (25 mg total) by mouth as needed (use as needed for HR greater than 110).  . Multiple Vitamins-Minerals (MULTIVITAMIN WITH MINERALS) tablet Take 1 tablet by mouth daily.    . ondansetron (ZOFRAN) 4 MG tablet Take 1 tablet (4 mg total) by mouth every 6 (six) hours as needed for nausea.  . pantoprazole (PROTONIX) 40 MG tablet TAKE 1 TABLET(40 MG) BY MOUTH DAILY  . Polyethyl Glycol-Propyl Glycol (SYSTANE OP) Place 1 drop into both eyes daily.   . Probiotic Product (PROBIOTIC DAILY PO) Take 1 capsule by mouth daily. Digestive Advantage Probiotic  . rifaximin (XIFAXAN) 550 MG TABS tablet Take 1 tablet (550 mg total) by mouth 2 (two) times daily.   No facility-administered encounter medications on file as of 02/13/2020.    Review of Systems:  Review of Systems  Constitutional: Positive for malaise/fatigue. Negative for chills  and fever.  HENT: Negative for congestion, hearing loss and sore throat.   Eyes: Negative for blurred vision.  Respiratory: Negative for cough and shortness of breath.   Cardiovascular: Positive  for leg swelling. Negative for chest pain and palpitations.  Gastrointestinal: Negative for abdominal pain, blood in stool, constipation, diarrhea, melena, nausea and vomiting.  Genitourinary: Negative for dysuria.  Musculoskeletal: Negative for falls and joint pain.       Back pain is much better after epidural--she can do something again  Skin: Negative for itching and rash.  Neurological: Negative for dizziness and loss of consciousness.  Endo/Heme/Allergies: Bruises/bleeds easily.  Psychiatric/Behavioral: Negative for depression and memory loss. The patient is not nervous/anxious.     Health Maintenance  Topic Date Due  . OPHTHALMOLOGY EXAM  08/24/2018  . FOOT EXAM  10/04/2019  . URINE MICROALBUMIN  01/31/2020  . COVID-19 Vaccine (1) 02/29/2020 (Originally 12/06/1957)  . HEMOGLOBIN A1C  08/02/2020  . COLONOSCOPY  07/07/2021  . MAMMOGRAM  11/21/2021  . INFLUENZA VACCINE  Completed  . DEXA SCAN  Completed  . Hepatitis C Screening  Completed  . PNA vac Low Risk Adult  Completed  . TETANUS/TDAP  Discontinued    Physical Exam: Vitals:   02/13/20 1439  BP: 116/62  Pulse: 62  Temp: 97.7 F (36.5 C)  TempSrc: Temporal  SpO2: 99%   There is no height or weight on file to calculate BMI. Physical Exam Vitals reviewed.  Constitutional:      General: She is not in acute distress.    Appearance: Normal appearance. She is not toxic-appearing.  HENT:     Head: Normocephalic and atraumatic.  Cardiovascular:     Rate and Rhythm: Normal rate and regular rhythm.     Pulses: Normal pulses.     Heart sounds: Normal heart sounds.  Pulmonary:     Effort: Pulmonary effort is normal.     Breath sounds: Normal breath sounds. No wheezing, rhonchi or rales.  Abdominal:     General: Bowel sounds  are normal. There is no distension.     Palpations: Abdomen is soft.     Tenderness: There is no abdominal tenderness. There is no guarding or rebound.  Musculoskeletal:        General: Normal range of motion.     Right lower leg: Edema present.     Left lower leg: No edema.  Skin:    General: Skin is warm and dry.     Comments: Small scaly area on right clavicle that is not resolving  Neurological:     General: No focal deficit present.     Mental Status: She is alert and oriented to person, place, and time.     Cranial Nerves: No cranial nerve deficit.     Sensory: Sensory deficit present.     Motor: No weakness.     Gait: Gait normal.  Psychiatric:        Mood and Affect: Mood normal.        Behavior: Behavior normal.     Labs reviewed: Basic Metabolic Panel: Recent Labs    05/30/19 0424 05/30/19 0424 05/31/19 0500 05/31/19 0500 06/01/19 0239 06/11/19 0000 08/19/19 1530 08/19/19 1731 08/20/19 0307 09/10/19 0844 10/08/19 0917 11/08/19 1030  NA 139   < > 137   < > 140   < >  --   --    < > 140 140 140  K 4.0   < > 3.6   < > 3.6   < >  --   --    < > 3.7 3.6 3.6  CL 111   < > 110   < > 116*   < >  --   --    < >  108 107 102  CO2 20*   < > 19*   < > 18*   < >  --   --    < > 22 27 29   GLUCOSE 192*   < > 239*   < > 239*   < >  --   --    < > 128* 137* 141*  BUN 13   < > 15   < > 12   < >  --   --    < > 11 18 21   CREATININE 0.88   < > 0.93   < > 0.82   < >  --   --    < > 1.00 0.91 0.89  CALCIUM 9.6   < > 9.1   < > 9.1   < >  --   --    < > 9.3 9.3 9.9  MG 2.0   < > 1.9  --  1.6*  --   --  2.1  --   --   --   --   PHOS 2.0*  --  3.3  --  3.5  --   --   --   --   --   --   --   TSH  --   --   --   --   --   --  3.704  --   --   --  3.96  --    < > = values in this interval not displayed.   Liver Function Tests: Recent Labs    08/19/19 1529 08/19/19 1529 09/10/19 0844 09/10/19 0844 10/08/19 0917 11/08/19 1030 02/03/20 1009  AST 63*   < > 79*   < > 55* 55* 46*   ALT 41   < > 51*   < > 35* 36* 35*  ALKPHOS 144*  --  141*  --   --  149*  --   BILITOT 2.2*   < > 1.7*   < > 1.5* 1.8* 1.5*  PROT 7.4   < > 6.3*   < > 6.2 6.9 6.3  ALBUMIN 3.0*  --  2.6*  --   --  3.4*  --    < > = values in this interval not displayed.   Recent Labs    04/06/19 1300 05/15/19 1042 05/26/19 1332  LIPASE 23 29 25    Recent Labs    05/15/19 1042 05/18/19 1456 05/26/19 1521  AMMONIA 111* 39* 41*   CBC: Recent Labs    10/08/19 0917 11/08/19 1030 02/03/20 1009  WBC 3.0* 3.5* 3.6*  NEUTROABS 1,668 2.1 2,045  HGB 12.2 12.9 13.1  HCT 37.2 38.1 38.8  MCV 89.9 88.6 89.6  PLT 103* 98.0* 102*   Lipid Panel: Recent Labs    08/20/19 0307 10/08/19 0917 02/03/20 1009  CHOL 128 150 161  HDL 58 68 76  LDLCALC 61 66 69  TRIG 44 76 77  CHOLHDL 2.2 2.2 2.1   Lab Results  Component Value Date   HGBA1C 9.2 (H) 02/03/2020    Procedures since last visit: No results found.  Assessment/Plan 1. Cirrhosis of liver with ascites, unspecified hepatic cirrhosis type ([place]) -stable recently, liver panel unchanged  2. Uncontrolled type 2 diabetes mellitus with hyperglycemia, with long-term current use of insulin ([place]) - recommended she try freestyle libre if affordable for her (also husband) - keep insulin and jardiance as is pending having ability to check cbgs more readily - Continuous Blood Gluc  Receiver (FREESTYLE LIBRE 14 DAY READER) DEVI; 1 Device by Does not apply route in the morning and at bedtime.  Dispense: 1 each; Refill: 0 - Continuous Blood Gluc Sensor (FREESTYLE LIBRE 14 DAY SENSOR) MISC; 1 Units by Does not apply route in the morning and at bedtime.  Dispense: 1 each; Refill: 4  3. Hyperlipidemia associated with type 2 diabetes mellitus ([facility]) -cont current regimen and monitor  4. Thrombocytopenia ([place]) -due to cirrhosis, cont to monitor  5. Coronary artery disease of native artery of native heart with stable angina pectoris ([place]) -no recent related  complaints, stable, cont same regimen and monitor  6. Skin lesion on examination -possible squamous cell since not healing on right clavicle region--recommend she see her dermatologist about it  Labs/tests ordered:   Lab Orders     Hemoglobin A1c     CBC with Differential/Platelet     COMPLETE METABOLIC PANEL WITH GFR   Next appt:  06/15/2020  Aleigha Gilani L. Cheyanne Lamison, D.O. [facility] [address], [place] [address] Cell Phone (Mon-Fri 8am-5pm):  [phone] On Call:  [phone] & follow prompts after 5pm & weekends Office Phone:  [phone] Office Fax:  [phone]

## 2020-02-14 ENCOUNTER — Other Ambulatory Visit: Payer: Self-pay | Admitting: Cardiovascular Disease

## 2020-02-14 ENCOUNTER — Other Ambulatory Visit: Payer: Self-pay | Admitting: Cardiology

## 2020-03-09 ENCOUNTER — Other Ambulatory Visit: Payer: Self-pay

## 2020-03-09 ENCOUNTER — Ambulatory Visit
Admission: EM | Admit: 2020-03-09 | Discharge: 2020-03-09 | Disposition: A | Discharge status: Home / Self Care | Payer: Medicare Other | Attending: Emergency Medicine | Admitting: Emergency Medicine

## 2020-03-09 DIAGNOSIS — Z1152 Encounter for screening for COVID-19: Secondary | ICD-10-CM

## 2020-03-09 DIAGNOSIS — R059 Cough, unspecified: Secondary | ICD-10-CM

## 2020-03-09 MED ORDER — BENZONATATE 100 MG PO CAPS
100.0000 mg | ORAL_CAPSULE | Freq: Three times a day (TID) | ORAL | 0 refills | Status: DC
Start: 2020-03-09 — End: 2020-04-10

## 2020-03-09 NOTE — ED Triage Notes (Signed)
Pt complains of cough since yesterday as well as intermittent headaches. Pt is aox4 and ambulatory.

## 2020-03-09 NOTE — Discharge Instructions (Addendum)

## 2020-03-09 NOTE — ED Provider Notes (Signed)
[facility]    CSN: [id number] Arrival date & time: 03/09/20  1516      History   Chief Complaint Chief Complaint  Patient presents with  . Cough    since yesterday  . Headache    Intermittent since yesterday    HPI [name] is a 74 y.o. female  Subjective:   [name] is a 74 y.o. female here for evaluation of a cough.  The cough is non-productive, without wheezing, dyspnea or hemoptysis. Onset of symptoms was 1 day ago, unchanged since that time.  Associated symptoms include none. Patient does not have a history of asthma. Patient has not had recent travel. Patient does not have a history of smoking. Patient  has not had a previous chest x-ray. Patient has not had a PPD done.  Husband was ill as well - covid test pending. The following portions of the patient's history were reviewed and updated as appropriate: allergies, current medications, past family history, past medical history, past social history, past surgical history and problem list.  Plan:  Explained lack of efficacy of antibiotics in viral disease. Antitussives per medication orders. Avoid exposure to tobacco smoke and fumes. Call if shortness of breath worsens, blood in sputum, change in character of cough, development of fever or chills, inability to maintain nutrition and hydration. Avoid exposure to tobacco smoke and fumes.      Past Medical History:  Diagnosis Date  . A-fib ([place])    08/19/19 afib with RVR, spontaneously converted to SR  . Allergy   . Arthritis    neck  . Cataract    bilateral - MD monitoring cataracts  . CHF (congestive heart failure) ([place])   . Chronic kidney disease, stage I    DR OTTELIN  HX UTIS  . Cirrhosis ([place])   . Coronary artery disease    s/p CABG 01/29/16: LIMA-LAD, SVG-RI, SVG-dRCA  . Cramp of limb   . Diabetes mellitus   . Dysphagia, unspecified(787.20)   . Dysuria   . Epistaxis   . GERD (gastroesophageal reflux disease)     . Hearing loss    wears hearing aids  . Heart murmur     DX FOR YEARS ASYMPTOMATIC  . Hepatic encephalopathy ([place])   . Lumbago   . Myocardial infarction ([place])    NSTEMI 01/27/16  . Neoplasm of uncertain behavior of skin   . Nonspecific elevation of levels of transaminase or lactic acid dehydrogenase (LDH)   . Osteoarthrosis, unspecified whether generalized or localized, unspecified site   . Other and unspecified hyperlipidemia    diet controlled  . Pain in joint, shoulder region   . Paresthesias 04/01/2015  . Postablative ovarian failure   . Trochanteric bursitis of left hip 12/15/2015  . Type 2 diabetes mellitus without complication ([place])   . Unspecified essential hypertension    no meds  . Wears glasses     Patient Active Problem List   Diagnosis Date Noted  . Atrial fibrillation with rapid ventricular response ([place]) 08/19/2019  . S/P TIPS (transjugular intrahepatic portosystemic shunt)   . Hypercalcemia 05/26/2019  . AKI (acute kidney injury) ([place])   . Hyponatremia   . Acute lower UTI 05/16/2019  . Acute delirium   . General weakness 05/15/2019  . Encephalopathy, hepatic ([place]) 05/15/2019  . Volume depletion 05/15/2019  . Bleeding esophageal varices ([place])   . UGI bleed   . Infarction of spleen 04/06/2019  . Odynophagia 04/01/2019  . Dysphagia   .  Hepatoma ([place]) 01/31/2019  . Coronary artery disease of native artery of native heart with stable angina pectoris ([place]) 01/31/2019  . Thrombocytopenia ([place]) 09/25/2017  . Left forearm pain 05/29/2017  . Portal vein thrombosis 04/22/2017  . Skin cancer of nose 08/23/2016  . History of colonic polyps   . Benign neoplasm of rectum   . Esophageal varices without bleeding ([place])   . Portal hypertensive gastropathy ([place])   . Leg cramps 05/18/2016  . Routine lab draw 05/13/2016  . Iron deficiency anemia 05/09/2016  . Statin-induced rhabdomyolysis 05/07/2016  . Liver cirrhosis ([place]) 04/05/2016  . PVC's (premature ventricular  contractions) 04/05/2016  . CAD (coronary artery disease) 02/16/2016  . S/P CABG x 3 02/02/2016  . History of non-ST elevation myocardial infarction (NSTEMI) 01/27/2016  . DM (diabetes mellitus), type 2, uncontrolled ([place]) 12/15/2015  . Trochanteric bursitis of left hip 12/15/2015  . S/P lumbar spinal fusion 06/10/2015  . Paresthesia 04/01/2015  . Osteoporosis 03/31/2015  . Hearing loss 06/25/2014  . Insomnia 06/25/2014  . Hair thinning 06/25/2014  . Muscle spasm 10/08/2013  . Biceps tendon rupture, proximal 11/24/2012  . Hyperlipemia   . GERD (gastroesophageal reflux disease)   . DM (diabetes mellitus) ([place])   . Essential hypertension     Past Surgical History:  Procedure Laterality Date  . BACK SURGERY  06/2015  . BREAST BIOPSY Left   . CARDIAC CATHETERIZATION N/A 01/27/2016   Procedure: Left Heart Cath and Coronary Angiography;  Surgeon: Belva Crome, MD;  Location: [facility];  Service: Cardiovascular;  Laterality: N/A;  . COLONOSCOPY  2012   Dr Lajoyce Corners.   . COLONOSCOPY WITH PROPOFOL N/A 07/07/2016   Procedure: COLONOSCOPY WITH PROPOFOL;  Surgeon: [name], MD;  Location: [facility];  Service: Endoscopy;  Laterality: N/A;  . CORONARY ARTERY BYPASS GRAFT N/A 01/28/2016   Procedure: CORONARY ARTERY BYPASS GRAFTING (CABG) x 3 USING RIGHT LEG GREATER SAPHENOUS VEIN GRAFT;  Surgeon: Melrose Nakayama, MD;  Location: [facility];  Service: Open Heart Surgery;  Laterality: N/A;  . ENDOVEIN HARVEST OF GREATER SAPHENOUS VEIN Right 01/28/2016   Procedure: ENDOVEIN HARVEST OF GREATER SAPHENOUS VEIN;  Surgeon: Melrose Nakayama, MD;  Location: [place] [place];  Service: Open Heart Surgery;  Laterality: Right;  . ESOPHAGEAL BANDING  03/28/2019   Procedure: ESOPHAGEAL BANDING;  Surgeon: [name], MD;  Location: [facility];  Service: Endoscopy;;  . ESOPHAGEAL BANDING  04/07/2019   Procedure: ESOPHAGEAL BANDING;  Surgeon: Juanita Craver, MD;  Location[facility];  Service:  Endoscopy;;  . ESOPHAGOGASTRODUODENOSCOPY N/A 04/07/2019   Procedure: ESOPHAGOGASTRODUODENOSCOPY (EGD);  Surgeon: Juanita Craver, MD;  Location: [facility] ENDOSCOPY;  Service: Endoscopy;  Laterality: N/A;  . ESOPHAGOGASTRODUODENOSCOPY (EGD) WITH PROPOFOL N/A 07/07/2016   Procedure: ESOPHAGOGASTRODUODENOSCOPY (EGD) WITH PROPOFOL;  Surgeon: [name], MD;  Location: [facility];  Service: Endoscopy;  Laterality: N/A;  . ESOPHAGOGASTRODUODENOSCOPY (EGD) WITH PROPOFOL N/A 03/28/2019   Procedure: ESOPHAGOGASTRODUODENOSCOPY (EGD) WITH PROPOFOL;  Surgeon: [name], MD;  Location: [facility];  Service: Endoscopy;  Laterality: N/A;  . HEMOSTASIS CLIP PLACEMENT  04/07/2019   Procedure: HEMOSTASIS CLIP PLACEMENT;  Surgeon: Juanita Craver, MD;  Location: [facility];  Service: Endoscopy;;  . IR ANGIOGRAM SELECTIVE EACH ADDITIONAL VESSEL  04/08/2019  . IR EMBO ART  VEN HEMORR LYMPH EXTRAV  INC GUIDE ROADMAPPING  04/08/2019  . IR PARACENTESIS  04/08/2019  . IR RADIOLOGIST EVAL & MGMT  06/13/2019  . IR RADIOLOGIST EVAL & MGMT  09/25/2019  .  IR TIPS  04/08/2019  . MAXIMUM ACCESS (MAS)POSTERIOR LUMBAR INTERBODY FUSION (PLIF) 1 LEVEL Left 06/10/2015   Procedure: FOR MAXIMUM ACCESS (MAS) POSTERIOR LUMBAR INTERBODY FUSION (PLIF) LUMBAR THREE-FOUR EXTRAFORAMINAL MICRODISCECTOMY LUMBAR FIVE-SACRAL ONE LEFT;  Surgeon: Eustace Moore, MD;  Location: [facility];  Service: Neurosurgery;  Laterality: Left;  . RADIOLOGY WITH ANESTHESIA N/A 04/08/2019   Procedure: RADIOLOGY WITH ANESTHESIA;  Surgeon: Radiologist, Medication, MD;  Location: [facility];  Service: Radiology;  Laterality: N/A;  . SCLEROTHERAPY  04/07/2019   Procedure: SCLEROTHERAPY;  Surgeon: Juanita Craver, MD;  Location[facility];  Service: Endoscopy;;  . TEE WITHOUT CARDIOVERSION N/A 01/28/2016   Procedure: TRANSESOPHAGEAL ECHOCARDIOGRAM (TEE);  Surgeon: Melrose Nakayama, MD;  Location: [place];  Service: Open Heart Surgery;  Laterality: N/A;  . TUBAL  LIGATION  1982   Dr Connye Burkitt  . UMBILICAL HERNIA REPAIR N/A 09/10/2019   Procedure: HERNIA REPAIR UMBILICAL ADULT;  Surgeon: Donnie Mesa, MD;  Location: [facility];  Service: General;  Laterality: N/A;  . UPPER GASTROINTESTINAL ENDOSCOPY    . VAGINAL HYSTERECTOMY  1997   Dr Rande Lawman    OB History   No obstetric history on file.      Home Medications    Prior to Admission medications   Medication Sig Start Date End Date Taking? Authorizing Provider  acetaminophen (TYLENOL) 500 MG tablet Take 500 mg by mouth at bedtime.    Yes [provider]  Continuous Blood Gluc Receiver (FREESTYLE LIBRE 14 DAY READER) DEVI 1 Device by Does not apply route in the morning and at bedtime. 02/13/20  Yes Reed, Tiffany L, DO  Continuous Blood Gluc Sensor (FREESTYLE LIBRE 14 DAY SENSOR) MISC 1 Units by Does not apply route in the morning and at bedtime. 02/13/20  Yes Reed, Tiffany L, DO  Cyanocobalamin (VITAMIN B 12 PO) Take 1,000 mcg by mouth daily.     Yes [provider]  ezetimibe (ZETIA) 10 MG tablet TAKE 1 TABLET BY MOUTH EVERY DAY 01/10/20  Yes Reed, Tiffany L, DO  furosemide (LASIX) 40 MG tablet TAKE 1 TABLET(40 MG) BY MOUTH DAILY 02/17/20  Yes Josue Hector, MD  glucose blood test strip One Touch Ultra II strips. Use to test blood sugar three times daily. Dx: E11.65 10/14/19  Yes Reed, Tiffany L, DO  insulin detemir (LEVEMIR FLEXTOUCH) 100 UNIT/ML FlexPen Inject 30 Units into the skin at bedtime. 12/19/19  Yes Reed, Tiffany L, DO  Insulin Pen Needle (BD PEN NEEDLE NANO U/F) 32G X 4 MM MISC USE THREE TIMES DAILY AS DIRECTED 10/14/19  Yes Reed, Tiffany L, DO  lactulose (CHRONULAC) 10 GM/15ML solution TAKE 45 ML BY MOUTH THREE TIMES DAILY AS DIRECTED 01/20/20  Yes [name], MD  pantoprazole (PROTONIX) 40 MG tablet TAKE 1 TABLET(40 MG) BY MOUTH DAILY 11/25/19  Yes Reed, Tiffany L, DO  Probiotic Product (PROBIOTIC DAILY PO) Take 1 capsule by mouth daily. Digestive Advantage Probiotic   Yes  [provider]  benzonatate (TESSALON) 100 MG capsule Take 1 capsule (100 mg total) by mouth every 8 (eight) hours. 03/09/20   Hall-Potvin, [place], PA-C  Biotin 10000 MCG TABS Take 10,000 mcg by mouth every morning.    [provider]  colchicine 0.6 MG tablet Take 1 tablet (0.6 mg total) by mouth daily. Discontinue after 3 more months 10/25/19   Reed, Tiffany L, DO  Insulin Syringe-Needle U-100 (INSULIN SYRINGE 1CC/31GX5/16") 31G X 5/16" 1 ML MISC USE AS DIRECTED DAILY WITH LEVEMIR. Dx:  E11.65 07/29/19   Reed, Tiffany L, DO  JARDIANCE 25 MG TABS tablet TAKE 1 TABLET BY MOUTH DAILY 12/24/19   Reed, Tiffany L, DO  loratadine (CLARITIN) 10 MG tablet Take 10 mg by mouth daily as needed for allergies.    [provider]  MAGNESIUM PO Take 500 mg by mouth daily.     [provider]  metoprolol tartrate (LOPRESSOR) 25 MG tablet Take 1 tablet (25 mg total) by mouth as needed (use as needed for HR greater than 110). 08/21/19   Tommie Raymond, NP  Multiple Vitamins-Minerals (MULTIVITAMIN WITH MINERALS) tablet Take 1 tablet by mouth daily.      [provider]  Polyethyl Glycol-Propyl Glycol (SYSTANE OP) Place 1 drop into both eyes daily.     [provider]  rifaximin (XIFAXAN) 550 MG TABS tablet Take 1 tablet (550 mg total) by mouth 2 (two) times daily. 06/05/19   [name], MD    Family History Family History  Problem Relation Age of Onset  . Lung cancer Father   . Arthritis Sister   . Arthritis Brother   . Liver cancer Brother   . Heart disease Maternal Grandmother   . Heart disease Maternal Grandfather   . Heart disease Paternal Grandmother   . Heart disease Paternal Grandfather   . Breast cancer Mother   . Other Daughter        house fire  . Breast cancer Maternal Aunt   . Breast cancer Paternal Aunt   . Colon cancer Neg Hx   . Esophageal cancer Neg Hx   . Rectal cancer Neg Hx   . Stomach cancer Neg Hx     Social  History Social History   Tobacco Use  . Smoking status: Never Smoker  . Smokeless tobacco: Never Used  Vaping Use  . Vaping Use: Never used  Substance Use Topics  . Alcohol use: No  . Drug use: No     Allergies   Kiwi extract, Tdap [tetanus-diphth-acell pertussis], Statins, Latex, and Tramadol   Review of Systems Review of Systems  Constitutional: Negative for fatigue and fever.  HENT: Negative for ear pain, sinus pain, sore throat and voice change.   Eyes: Negative for pain, redness and visual disturbance.  Respiratory: Positive for cough. Negative for shortness of breath.   Cardiovascular: Negative for chest pain and palpitations.  Gastrointestinal: Negative for abdominal pain, diarrhea and vomiting.  Musculoskeletal: Negative for arthralgias and myalgias.  Skin: Negative for rash and wound.  Neurological: Negative for syncope and headaches.     Physical Exam Triage Vital Signs ED Triage Vitals  Enc Vitals Group     BP      Pulse      Resp      Temp      Temp src      SpO2      Weight      Height      Head Circumference      Peak Flow      Pain Score      Pain Loc      Pain Edu?      Excl. in [place]?    No data found.  Updated Vital Signs BP 122/68 (BP Location: Left Arm)   Temp 99.8 F (37.7 C) (Oral)   Resp 20   LMP  (LMP Unknown)   SpO2 94%   Visual Acuity Right Eye Distance:   Left Eye Distance:   Bilateral Distance:  Right Eye Near:   Left Eye Near:    Bilateral Near:     Physical Exam Constitutional:      General: She is not in acute distress.    Appearance: She is not ill-appearing or diaphoretic.  HENT:     Head: Normocephalic and atraumatic.     Mouth/Throat:     Mouth: Mucous membranes are moist.     Pharynx: Oropharynx is clear. No oropharyngeal exudate or posterior oropharyngeal erythema.  Eyes:     General: No scleral icterus.    Conjunctiva/sclera: Conjunctivae normal.     Pupils: Pupils are equal, round, and reactive to  light.  Neck:     Comments: Trachea midline, negative JVD Cardiovascular:     Rate and Rhythm: Normal rate and regular rhythm.     Heart sounds: No murmur heard.  No gallop.   Pulmonary:     Effort: Pulmonary effort is normal. No respiratory distress.     Breath sounds: No wheezing, rhonchi or rales.  Musculoskeletal:     Cervical back: Neck supple. No tenderness.  Lymphadenopathy:     Cervical: No cervical adenopathy.  Skin:    Capillary Refill: Capillary refill takes less than 2 seconds.     Coloration: Skin is not jaundiced or pale.     Findings: No rash.  Neurological:     General: No focal deficit present.     Mental Status: She is alert and oriented to person, place, and time.      UC Treatments / Results  Labs (all labs ordered are listed, but only abnormal results are displayed) Labs Reviewed  NOVEL CORONAVIRUS, NAA    EKG   Radiology No results found.  Procedures Procedures (including critical care time)  Medications Ordered in UC Medications - No data to display  Initial Impression / Assessment and Plan / UC Course  I have reviewed the triage vital signs and the nursing notes.  Pertinent labs & imaging results that were available during my care of the patient were reviewed by me and considered in my medical decision making (see chart for details).     Patient afebrile, nontoxic, with SpO2 94%.  Covid PCR pending.  Patient to quarantine until results are back.  We will treat supportively as outlined below.  Return precautions discussed, patient verbalized understanding and is agreeable to plan. Final Clinical Impressions(s) / UC Diagnoses   Final diagnoses:  Encounter for screening for COVID-19  Cough     Discharge Instructions     Tessalon for cough. Start flonase, atrovent nasal spray for nasal congestion/drainage. You can use over the counter nasal saline rinse such as neti pot for nasal congestion. Keep hydrated, your urine should be clear to  pale yellow in color. Tylenol/motrin for fever and pain. Monitor for any worsening of symptoms, chest pain, shortness of breath, wheezing, swelling of the throat, go to the emergency department for further evaluation needed.     ED Prescriptions    Medication Sig Dispense Auth. Provider   benzonatate (TESSALON) 100 MG capsule Take 1 capsule (100 mg total) by mouth every 8 (eight) hours. 21 capsule Hall-Potvin, [place], PA-C     PDMP not reviewed this encounter.   Hall-Potvin, [place], [place] 03/09/20 1607

## 2020-03-10 ENCOUNTER — Other Ambulatory Visit: Payer: Self-pay | Admitting: *Deleted

## 2020-03-10 DIAGNOSIS — Z1152 Encounter for screening for COVID-19: Secondary | ICD-10-CM | POA: Diagnosis not present

## 2020-03-10 NOTE — Patient Outreach (Signed)
[facility]) Care Management  03/10/2020  [name] 10-28-1945 [id number]   Call placed to member's son to follow up on management of diabetes.  Also noted that she was seen at urgent care center for Covid test.  No answer, HIPAA compliant voice message left.  Will follow up within the next 3-4 business days.  [name], [place], MSN [place] [phone]

## 2020-03-11 LAB — SPECIMEN STATUS REPORT

## 2020-03-11 LAB — SARS-COV-2, NAA 2 DAY TAT

## 2020-03-11 LAB — NOVEL CORONAVIRUS, NAA: SARS-CoV-2, NAA: NOT DETECTED

## 2020-03-13 ENCOUNTER — Other Ambulatory Visit: Payer: Self-pay | Admitting: *Deleted

## 2020-03-13 NOTE — Patient Outreach (Signed)
[facility]) Care Management  03/13/2020  [name] Aug 25, 1945 [id number]   Outreach attempt #2, no answer, HIPAA compliant voice message left.  Will send unsuccessful outreach letter and follow up within the next 3-4 business days.  [name], [place], MSN [place] [phone]

## 2020-03-16 ENCOUNTER — Telehealth: Payer: Self-pay | Admitting: Gastroenterology

## 2020-03-16 NOTE — Telephone Encounter (Signed)
Patient aware that she should continue taking Xifaxan per Dr [name].  Patient is currently enrolled in the patient assistance program with  [facility] and will be due for re-enrollment by 05-08-20. Patient requested information be sent to by MyChart regarding how she should proceed with re-enrollment.  Will send MyChart message with instructions.

## 2020-03-16 NOTE — Telephone Encounter (Signed)
Yes, thanks

## 2020-03-17 ENCOUNTER — Other Ambulatory Visit: Payer: Self-pay | Admitting: Gastroenterology

## 2020-03-18 ENCOUNTER — Other Ambulatory Visit: Payer: Self-pay | Admitting: *Deleted

## 2020-03-18 NOTE — Patient Outreach (Signed)
[facility]) Care Management  03/18/2020  [name] [birth year] [id number]   Outreach attempt #3 to son/contact person, no answer.  HIPAA compliant voice message left.  Will follow up with 4th and final attempt within the next 4 weeks.  If remain unsuccessful, will close case due to inability to maintain contact.  [name], [place], MSN [place] [phone]

## 2020-03-25 ENCOUNTER — Other Ambulatory Visit: Payer: Self-pay | Admitting: Internal Medicine

## 2020-03-25 NOTE — Telephone Encounter (Signed)
rx sent to pharmacy by e-script  

## 2020-03-26 ENCOUNTER — Telehealth: Payer: Self-pay | Admitting: Internal Medicine

## 2020-03-26 NOTE — Telephone Encounter (Signed)
Received and completed patient assistance form and placed to fax back to [facility] as requested.

## 2020-04-01 ENCOUNTER — Telehealth: Payer: Self-pay | Admitting: *Deleted

## 2020-04-01 NOTE — Telephone Encounter (Signed)
Received fax from [place] with [facility] [phone] for patient assistance for Levemir. Novo Nordisk Copywriter, advertising.   Filled out, attached medication list and placed in Dr. Cyndi Lennert folder to review and sign.  Once completed to be faxed back to Fax: [phone]

## 2020-04-08 DIAGNOSIS — Z8616 Personal history of COVID-19: Secondary | ICD-10-CM

## 2020-04-08 DIAGNOSIS — U071 COVID-19: Secondary | ICD-10-CM

## 2020-04-08 HISTORY — DX: COVID-19: U07.1

## 2020-04-08 HISTORY — DX: Personal history of COVID-19: Z86.16

## 2020-04-10 ENCOUNTER — Other Ambulatory Visit: Payer: Self-pay

## 2020-04-10 ENCOUNTER — Encounter (HOSPITAL_COMMUNITY): Payer: Self-pay

## 2020-04-10 ENCOUNTER — Emergency Department (HOSPITAL_COMMUNITY)
Admission: EM | Admit: 2020-04-10 | Discharge: 2020-04-10 | Disposition: A | Discharge status: Home / Self Care | Payer: Medicare Other | Attending: Emergency Medicine | Admitting: Emergency Medicine

## 2020-04-10 ENCOUNTER — Emergency Department (HOSPITAL_COMMUNITY): Payer: Medicare Other

## 2020-04-10 DIAGNOSIS — I5032 Chronic diastolic (congestive) heart failure: Secondary | ICD-10-CM | POA: Insufficient documentation

## 2020-04-10 DIAGNOSIS — R059 Cough, unspecified: Secondary | ICD-10-CM | POA: Diagnosis not present

## 2020-04-10 DIAGNOSIS — R5383 Other fatigue: Secondary | ICD-10-CM | POA: Diagnosis present

## 2020-04-10 DIAGNOSIS — Z91040 Latex allergy status: Secondary | ICD-10-CM | POA: Insufficient documentation

## 2020-04-10 DIAGNOSIS — E1122 Type 2 diabetes mellitus with diabetic chronic kidney disease: Secondary | ICD-10-CM | POA: Diagnosis not present

## 2020-04-10 DIAGNOSIS — Z79899 Other long term (current) drug therapy: Secondary | ICD-10-CM | POA: Diagnosis not present

## 2020-04-10 DIAGNOSIS — Z955 Presence of coronary angioplasty implant and graft: Secondary | ICD-10-CM | POA: Diagnosis not present

## 2020-04-10 DIAGNOSIS — I130 Hypertensive heart and chronic kidney disease with heart failure and stage 1 through stage 4 chronic kidney disease, or unspecified chronic kidney disease: Secondary | ICD-10-CM | POA: Insufficient documentation

## 2020-04-10 DIAGNOSIS — Z8522 Personal history of malignant neoplasm of nasal cavities, middle ear, and accessory sinuses: Secondary | ICD-10-CM | POA: Diagnosis not present

## 2020-04-10 DIAGNOSIS — Z794 Long term (current) use of insulin: Secondary | ICD-10-CM | POA: Insufficient documentation

## 2020-04-10 DIAGNOSIS — I2510 Atherosclerotic heart disease of native coronary artery without angina pectoris: Secondary | ICD-10-CM | POA: Diagnosis not present

## 2020-04-10 DIAGNOSIS — N181 Chronic kidney disease, stage 1: Secondary | ICD-10-CM | POA: Diagnosis not present

## 2020-04-10 DIAGNOSIS — U071 COVID-19: Secondary | ICD-10-CM

## 2020-04-10 DIAGNOSIS — Z20822 Contact with and (suspected) exposure to covid-19: Secondary | ICD-10-CM

## 2020-04-10 LAB — CBC WITH DIFFERENTIAL/PLATELET
Abs Immature Granulocytes: 0.01 10*3/uL (ref 0.00–0.07)
Basophils Absolute: 0 10*3/uL (ref 0.0–0.1)
Basophils Relative: 1 %
Eosinophils Absolute: 0 10*3/uL (ref 0.0–0.5)
Eosinophils Relative: 0 %
HCT: 41.3 % (ref 36.0–46.0)
Hemoglobin: 13.3 g/dL (ref 12.0–15.0)
Immature Granulocytes: 0 %
Lymphocytes Relative: 18 %
Lymphs Abs: 0.7 10*3/uL (ref 0.7–4.0)
MCH: 30.9 pg (ref 26.0–34.0)
MCHC: 32.2 g/dL (ref 30.0–36.0)
MCV: 95.8 fL (ref 80.0–100.0)
Monocytes Absolute: 0.7 10*3/uL (ref 0.1–1.0)
Monocytes Relative: 16 %
Neutro Abs: 2.7 10*3/uL (ref 1.7–7.7)
Neutrophils Relative %: 65 %
Platelets: 70 10*3/uL — ABNORMAL LOW (ref 150–400)
RBC: 4.31 MIL/uL (ref 3.87–5.11)
RDW: 15.2 % (ref 11.5–15.5)
WBC: 4.2 10*3/uL (ref 4.0–10.5)
nRBC: 0 % (ref 0.0–0.2)

## 2020-04-10 LAB — BASIC METABOLIC PANEL
Anion gap: 11 (ref 5–15)
BUN: 19 mg/dL (ref 8–23)
CO2: 23 mmol/L (ref 22–32)
Calcium: 9.3 mg/dL (ref 8.9–10.3)
Chloride: 103 mmol/L (ref 98–111)
Creatinine, Ser: 0.77 mg/dL (ref 0.44–1.00)
GFR, Estimated: >60 mL/min (ref >60)
Glucose, Bld: 131 mg/dL — ABNORMAL HIGH (ref 70–99)
Potassium: 3.9 mmol/L (ref 3.5–5.1)
Sodium: 137 mmol/L (ref 135–145)

## 2020-04-10 LAB — RESP PANEL BY RT-PCR (FLU A&B, COVID) ARPGX2
Influenza A by PCR: NEGATIVE
Influenza B by PCR: NEGATIVE
SARS Coronavirus 2 by RT PCR: POSITIVE — AB

## 2020-04-10 MED ORDER — BENZONATATE 100 MG PO CAPS: 100.0000 mg | ORAL_CAPSULE | Freq: Three times a day (TID) | ORAL | 0 refills | Status: DC

## 2020-04-10 NOTE — ED Triage Notes (Signed)
Patient states that her husband is a patient at [facility] with Covid. Patient states she has nasal congestion, fever, cough, and fatigue.  Patient states she wants to be tested for Covid.

## 2020-04-10 NOTE — ED Provider Notes (Signed)
[facility] DEPT Provider Note   CSN: [id number] Arrival date & time: 04/10/20  9233     History Chief Complaint  Patient presents with  . Covid Exposure    [name] is a 74 y.o. female, hypertension, diabetes type 2, NSTEMI (2017, s/p CABGx3), liver cirrhosis CHF, and CKD stage I.  Patient presents with chief complaint of fatigue, malaise headache, and nonproductive cough with COVID-19 exposure.  Patient reports that her husband tested positive for COVID-19 on 12/2 after having symptoms for the past week.  Patient reports that she has not received any COVID-19 vaccines.  Patient states that her symptoms began 2 days prior, have been constant, patient denies anything makes her symptoms better or worse.  Patient ports that her cough is nonproductive and she has not had any hemoptysis.  Endorses nausea this morning but denies any episodes of emesis.  Patient endorses diarrhea and chills.  Patient denies any loss of smell or taste, syncopal episodes, shortness of breath, chest pain, fever, abdominal pain, or dysuria.   Patient denies any home oxygen use, history of lung disease.  She reports being able to tolerate p.o. liquids and food.   HPI     Past Medical History:  Diagnosis Date  . A-fib ([place])    08/19/19 afib with RVR, spontaneously converted to SR  . Allergy   . Arthritis    neck  . Cataract    bilateral - MD monitoring cataracts  . CHF (congestive heart failure) ([place])   . Chronic kidney disease, stage I    DR OTTELIN  HX UTIS  . Cirrhosis ([place])   . Coronary artery disease    s/p CABG 01/29/16: LIMA-LAD, SVG-RI, SVG-dRCA  . Cramp of limb   . Diabetes mellitus   . Dysphagia, unspecified(787.20)   . Dysuria   . Epistaxis   . GERD (gastroesophageal reflux disease)   . Hearing loss    wears hearing aids  . Heart murmur     DX FOR YEARS ASYMPTOMATIC  . Hepatic encephalopathy ([place])   . Lumbago   . Myocardial infarction ([employer])     NSTEMI 01/27/16  . Neoplasm of uncertain behavior of skin   . Nonspecific elevation of levels of transaminase or lactic acid dehydrogenase (LDH)   . Osteoarthrosis, unspecified whether generalized or localized, unspecified site   . Other and unspecified hyperlipidemia    diet controlled  . Pain in joint, shoulder region   . Paresthesias 04/01/2015  . Postablative ovarian failure   . Trochanteric bursitis of left hip 12/15/2015  . Type 2 diabetes mellitus without complication ([place])   . Unspecified essential hypertension    no meds  . Wears glasses     Patient Active Problem List   Diagnosis Date Noted  . Atrial fibrillation with rapid ventricular response ([place]) 08/19/2019  . S/P TIPS (transjugular intrahepatic portosystemic shunt)   . Hypercalcemia 05/26/2019  . AKI (acute kidney injury) ([place])   . Hyponatremia   . Acute lower UTI 05/16/2019  . Acute delirium   . General weakness 05/15/2019  . Encephalopathy, hepatic ([place]) 05/15/2019  . Volume depletion 05/15/2019  . Bleeding esophageal varices ([place])   . UGI bleed   . Infarction of spleen 04/06/2019  . Odynophagia 04/01/2019  . Dysphagia   . Hepatoma ([place]) 01/31/2019  . Coronary artery disease of native artery of native heart with stable angina pectoris ([place]) 01/31/2019  . Thrombocytopenia ([place]) 09/25/2017  . Left forearm pain 05/29/2017  .  Portal vein thrombosis 04/22/2017  . Skin cancer of nose 08/23/2016  . History of colonic polyps   . Benign neoplasm of rectum   . Esophageal varices without bleeding ([place])   . Portal hypertensive gastropathy ([place])   . Leg cramps 05/18/2016  . Routine lab draw 05/13/2016  . Iron deficiency anemia 05/09/2016  . Statin-induced rhabdomyolysis 05/07/2016  . Liver cirrhosis ([place]) 04/05/2016  . PVC's (premature ventricular contractions) 04/05/2016  . CAD (coronary artery disease) 02/16/2016  . S/P CABG x 3 02/02/2016  . History of non-ST elevation myocardial infarction (NSTEMI) 01/27/2016    . DM (diabetes mellitus), type 2, uncontrolled ([place]) 12/15/2015  . Trochanteric bursitis of left hip 12/15/2015  . S/P lumbar spinal fusion 06/10/2015  . Paresthesia 04/01/2015  . Osteoporosis 03/31/2015  . Hearing loss 06/25/2014  . Insomnia 06/25/2014  . Hair thinning 06/25/2014  . Muscle spasm 10/08/2013  . Biceps tendon rupture, proximal 11/24/2012  . Hyperlipemia   . GERD (gastroesophageal reflux disease)   . DM (diabetes mellitus) ([place])   . Essential hypertension     Past Surgical History:  Procedure Laterality Date  . BACK SURGERY  06/2015  . BREAST BIOPSY Left   . CARDIAC CATHETERIZATION N/A 01/27/2016   Procedure: Left Heart Cath and Coronary Angiography;  Surgeon: Belva Crome, MD;  Location: [facility];  Service: Cardiovascular;  Laterality: N/A;  . COLONOSCOPY  2012   Dr Lajoyce Corners.   . COLONOSCOPY WITH PROPOFOL N/A 07/07/2016   Procedure: COLONOSCOPY WITH PROPOFOL;  Surgeon: [name], MD;  Location: [facility];  Service: Endoscopy;  Laterality: N/A;  . CORONARY ARTERY BYPASS GRAFT N/A 01/28/2016   Procedure: CORONARY ARTERY BYPASS GRAFTING (CABG) x 3 USING RIGHT LEG GREATER SAPHENOUS VEIN GRAFT;  Surgeon: Melrose Nakayama, MD;  Location: [facility];  Service: Open Heart Surgery;  Laterality: N/A;  . ENDOVEIN HARVEST OF GREATER SAPHENOUS VEIN Right 01/28/2016   Procedure: ENDOVEIN HARVEST OF GREATER SAPHENOUS VEIN;  Surgeon: Melrose Nakayama, MD;  Location: [facility];  Service: Open Heart Surgery;  Laterality: Right;  . ESOPHAGEAL BANDING  03/28/2019   Procedure: ESOPHAGEAL BANDING;  Surgeon: [name], MD;  Location: [facility];  Service: Endoscopy;;  . ESOPHAGEAL BANDING  04/07/2019   Procedure: ESOPHAGEAL BANDING;  Surgeon: Juanita Craver, MD;  Location: [facility] ENDOSCOPY;  Service: Endoscopy;;  . ESOPHAGOGASTRODUODENOSCOPY N/A 04/07/2019   Procedure: ESOPHAGOGASTRODUODENOSCOPY (EGD);  Surgeon: Juanita Craver, MD;  Location: [facility] ENDOSCOPY;  Service: Endoscopy;   Laterality: N/A;  . ESOPHAGOGASTRODUODENOSCOPY (EGD) WITH PROPOFOL N/A 07/07/2016   Procedure: ESOPHAGOGASTRODUODENOSCOPY (EGD) WITH PROPOFOL;  Surgeon: [name], MD;  Location: [facility];  Service: Endoscopy;  Laterality: N/A;  . ESOPHAGOGASTRODUODENOSCOPY (EGD) WITH PROPOFOL N/A 03/28/2019   Procedure: ESOPHAGOGASTRODUODENOSCOPY (EGD) WITH PROPOFOL;  Surgeon: [name], MD;  Location: [facility];  Service: Endoscopy;  Laterality: N/A;  . HEMOSTASIS CLIP PLACEMENT  04/07/2019   Procedure: HEMOSTASIS CLIP PLACEMENT;  Surgeon: Juanita Craver, MD;  Location: [facility] ENDOSCOPY;  Service: Endoscopy;;  . IR ANGIOGRAM SELECTIVE EACH ADDITIONAL VESSEL  04/08/2019  . IR EMBO ART  VEN HEMORR LYMPH EXTRAV  INC GUIDE ROADMAPPING  04/08/2019  . IR PARACENTESIS  04/08/2019  . IR RADIOLOGIST EVAL & MGMT  06/13/2019  . IR RADIOLOGIST EVAL & MGMT  09/25/2019  . IR TIPS  04/08/2019  . MAXIMUM ACCESS (MAS)POSTERIOR LUMBAR INTERBODY FUSION (PLIF) 1 LEVEL Left 06/10/2015   Procedure: FOR MAXIMUM ACCESS (MAS) POSTERIOR LUMBAR INTERBODY FUSION (PLIF) LUMBAR THREE-FOUR  EXTRAFORAMINAL MICRODISCECTOMY LUMBAR FIVE-SACRAL ONE LEFT;  Surgeon: Eustace Moore, MD;  Location: [facility]O [facility];  Service: Neurosurgery;  Laterality: Left;  . RADIOLOGY WITH ANESTHESIA N/A 04/08/2019   Procedure: RADIOLOGY WITH ANESTHESIA;  Surgeon: Radiologist, Medication, MD;  Location: [facility];  Service: Radiology;  Laterality: N/A;  . SCLEROTHERAPY  04/07/2019   Procedure: SCLEROTHERAPY;  Surgeon: Juanita Craver, MD;  Location: [facility] ENDOSCOPY;  Service: Endoscopy;;  . TEE WITHOUT CARDIOVERSION N/A 01/28/2016   Procedure: TRANSESOPHAGEAL ECHOCARDIOGRAM (TEE);  Surgeon: Melrose Nakayama, MD;  Location: [facility];  Service: Open Heart Surgery;  Laterality: N/A;  . TUBAL LIGATION  1982   Dr Connye Burkitt  . UMBILICAL HERNIA REPAIR N/A 09/10/2019   Procedure: HERNIA REPAIR UMBILICAL ADULT;  Surgeon: Donnie Mesa, MD;  Location: [facility];  Service: General;   Laterality: N/A;  . UPPER GASTROINTESTINAL ENDOSCOPY    . VAGINAL HYSTERECTOMY  1997   Dr Rande Lawman     OB History   No obstetric history on file.     Family History  Problem Relation Age of Onset  . Lung cancer Father   . Arthritis Sister   . Arthritis Brother   . Liver cancer Brother   . Heart disease Maternal Grandmother   . Heart disease Maternal Grandfather   . Heart disease Paternal Grandmother   . Heart disease Paternal Grandfather   . Breast cancer Mother   . Other Daughter        house fire  . Breast cancer Maternal Aunt   . Breast cancer Paternal Aunt   . Colon cancer Neg Hx   . Esophageal cancer Neg Hx   . Rectal cancer Neg Hx   . Stomach cancer Neg Hx     Social History   Tobacco Use  . Smoking status: Never Smoker  . Smokeless tobacco: Never Used  Vaping Use  . Vaping Use: Never used  Substance Use Topics  . Alcohol use: No  . Drug use: No    Home Medications Prior to Admission medications   Medication Sig Start Date End Date Taking? Authorizing Provider  acetaminophen (TYLENOL) 500 MG tablet Take 500 mg by mouth at bedtime.     [provider]  benzonatate (TESSALON) 100 MG capsule Take 1 capsule (100 mg total) by mouth every 8 (eight) hours. 04/10/20   Loni Beckwith, PA-C  Biotin 10000 MCG TABS Take 10,000 mcg by mouth every morning.    [provider]  colchicine 0.6 MG tablet Take 1 tablet (0.6 mg total) by mouth daily. Discontinue after 3 more months 10/25/19   Reed, Tiffany L, DO  Continuous Blood Gluc Receiver (FREESTYLE LIBRE 14 DAY READER) DEVI 1 Device by Does not apply route in the morning and at bedtime. 02/13/20   Reed, Tiffany L, DO  Continuous Blood Gluc Sensor (FREESTYLE LIBRE 14 DAY SENSOR) MISC 1 Units by Does not apply route in the morning and at bedtime. 02/13/20   Reed, Tiffany L, DO  Cyanocobalamin (VITAMIN B 12 PO) Take 1,000 mcg by mouth daily.      [provider]  ezetimibe (ZETIA) 10 MG tablet  TAKE 1 TABLET BY MOUTH EVERY DAY 01/10/20   Reed, Tiffany L, DO  furosemide (LASIX) 40 MG tablet TAKE 1 TABLET(40 MG) BY MOUTH DAILY 02/17/20   Josue Hector, MD  glucose blood test strip One Touch Ultra II strips. Use to test blood sugar three times daily. Dx: E11.65 10/14/19   Gayland Curry, DO  Insulin Pen Needle (BD PEN NEEDLE NANO U/F) 32G X 4 MM MISC USE THREE TIMES DAILY AS DIRECTED 10/14/19   Reed, Tiffany L, DO  Insulin Syringe-Needle U-100 (INSULIN SYRINGE 1CC/31GX5/16") 31G X 5/16" 1 ML MISC USE AS DIRECTED DAILY WITH LEVEMIR. Dx: E11.65 07/29/19   Reed, Tiffany L, DO  JARDIANCE 25 MG TABS tablet TAKE 1 TABLET BY MOUTH DAILY 12/24/19   Reed, Tiffany L, DO  lactulose (CHRONULAC) 10 GM/15ML solution TAKE 45 ML BY MOUTH THREE TIMES DAILY AS DIRECTED 01/20/20   [name], MD  LEVEMIR FLEXTOUCH 100 UNIT/ML FlexPen ADMINISTER 30 UNITS UNDER THE SKIN AT BEDTIME 03/25/20   Reed, Tiffany L, DO  loratadine (CLARITIN) 10 MG tablet Take 10 mg by mouth daily as needed for allergies.    [provider]  MAGNESIUM PO Take 500 mg by mouth daily.     [provider]  metoprolol tartrate (LOPRESSOR) 25 MG tablet Take 1 tablet (25 mg total) by mouth as needed (use as needed for HR greater than 110). 08/21/19   Tommie Raymond, NP  Multiple Vitamins-Minerals (MULTIVITAMIN WITH MINERALS) tablet Take 1 tablet by mouth daily.      [provider]  pantoprazole (PROTONIX) 40 MG tablet TAKE 1 TABLET(40 MG) BY MOUTH DAILY 11/25/19   Reed, Tiffany L, DO  Polyethyl Glycol-Propyl Glycol (SYSTANE OP) Place 1 drop into both eyes daily.     [provider]  Probiotic Product (PROBIOTIC DAILY PO) Take 1 capsule by mouth daily. Digestive Advantage Probiotic    [provider]  rifaximin (XIFAXAN) 550 MG TABS tablet Take 1 tablet (550 mg total) by mouth 2 (two) times daily. 06/05/19   [name], MD    Allergies    Kiwi extract, Tdap [tetanus-diphth-acell pertussis],  Statins, Latex, and Tramadol  Review of Systems   Review of Systems  Constitutional: Positive for chills and fatigue. Negative for fever.  HENT: Positive for congestion and rhinorrhea. Negative for sore throat and trouble swallowing.   Eyes: Negative for visual disturbance.  Respiratory: Positive for cough (Nonproductive). Negative for shortness of breath and wheezing.   Cardiovascular: Positive for palpitations (at baseline per patient). Negative for chest pain.  Gastrointestinal: Positive for diarrhea. Negative for abdominal distention, abdominal pain, blood in stool, nausea and vomiting.  Genitourinary: Negative for difficulty urinating and dysuria.  Musculoskeletal: Negative for back pain and neck pain.  Skin: Negative for color change and rash.  Neurological: Positive for headaches. Negative for dizziness, syncope and light-headedness.  Psychiatric/Behavioral: Negative for confusion.    Physical Exam Updated Vital Signs BP (!) 120/57   Pulse 75   Temp 99.4 F (37.4 C) (Oral)   Resp 19   Ht 5' 3"  (1.6 m)   Wt 59 kg   LMP  (LMP Unknown)   SpO2 99%   BMI 23.03 kg/m   Physical Exam Constitutional:      General: She is not in acute distress.    Appearance: She is ill-appearing. She is not toxic-appearing or diaphoretic.  HENT:     Head: Normocephalic.     Nose: Congestion and rhinorrhea present.  Cardiovascular:     Rate and Rhythm: Normal rate.  Pulmonary:     Effort: Pulmonary effort is normal. No accessory muscle usage or respiratory distress.     Breath sounds: Normal breath sounds. No wheezing, rhonchi or rales.  Chest:     Chest wall: No tenderness.  Abdominal:     Palpations: Abdomen is  soft.     Tenderness: There is no abdominal tenderness.  Skin:    General: Skin is warm and dry.  Neurological:     General: No focal deficit present.     Mental Status: She is alert.  Psychiatric:        Behavior: Behavior is cooperative.     ED Results / Procedures /  Treatments   Labs (all labs ordered are listed, but only abnormal results are displayed) Labs Reviewed  RESP PANEL BY RT-PCR (FLU A&B, COVID) ARPGX2 - Abnormal; Notable for the following components:      Result Value   SARS Coronavirus 2 by RT PCR POSITIVE (*)    All other components within normal limits  BASIC METABOLIC PANEL - Abnormal; Notable for the following components:   Glucose, Bld 131 (*)    All other components within normal limits  CBC WITH DIFFERENTIAL/PLATELET - Abnormal; Notable for the following components:   Platelets 70 (*)    All other components within normal limits    EKG None  Radiology DG Chest Portable 1 View  Result Date: 04/10/2020 CLINICAL DATA:  Cough EXAM: PORTABLE CHEST 1 VIEW COMPARISON:  08/19/2019 FINDINGS: The heart size and mediastinal contours are within normal limits. Both lungs are clear. No pleural effusion or pneumothorax. The visualized skeletal structures are unremarkable. IMPRESSION: No acute process in the chest. Electronically Signed   By: Macy Mis M.D.   On: 04/10/2020 12:05    Procedures Procedures (including critical care time)  Medications Ordered in ED Medications - No data to display  ED Course  I have reviewed the triage vital signs and the nursing notes.  Pertinent labs & imaging results that were available during my care of the patient were reviewed by me and considered in my medical decision making (see chart for details).    MDM Rules/Calculators/A&P                          Alert 74 year old female in no acute distress presents with chief complaint of nonproductive cough, headache, fatigue and generalized weakness x2 days.  Patient was exposed to Covid from her husband who had symptoms for 7 days and tested positive yesterday.  She denies any previous COVID-19 vaccinations.  Patient endorses chills, nausea, diarrhea, congestion and rhinorrhea.  Patient denies any loss of smell or taste, shortness of breath, chest  pain, vomiting, dysuria or syncopal episodes.  She reports that she is able to tolerate p.o. fluids and liquid.  No tachyphemia, or adventitious lung sounds noted.  Patient had a temperature of 100.9, heart rate and blood pressure within normal limits.    COVID-19 test, chest x-ray, BMP, CBC are pending.  If patient is Covid positive will consider discharging home if vitals remain stable and no oxygen requirement is needed.  Pneumonia is also considered for differential diagnosis, evaluate the need for antibiotics based on chest x-ray.   CBC showed platelets 70, and her CBC was unremarkable. BMP was unremarkable. Chest x-ray showed no pleural effusion, pneumothorax, infiltrates, acute process in the chest. COVID-19 test was positive.    Stable throughout hospitalization with oxygen saturation 98% or greater.  Patient was able to tolerate p.o. liquids without difficulty. Patient was ambulated oxygen saturation remained 97% or greater. She was given information of her positive COVID-19 test.  Patient was stable for discharge and joint decision-making was used with patient willing and happy to return home. Patient states she has family  members who will can regularly check on her as needed.  Patient is a candidate for monoclonal antibodies, her information was given to [facility] infusions group who will reach out to patient in 24-48hrs. Patient was informed of this expressed understanding. She was given option for [employer]  Patient was given information on isolation and given strict return precautions.  [name] was evaluated in Emergency Department on 04/10/2020 for the symptoms described in the history of present illness. She was evaluated in the context of the global COVID-19 pandemic, which necessitated consideration that the patient might be at risk for infection with the SARS-CoV-2 virus that causes COVID-19. Institutional protocols and algorithms that pertain to the evaluation of patients  at risk for COVID-19 are in a state of rapid change based on information released by regulatory bodies including the CDC and federal and state organizations. These policies and algorithms were followed during the patient's care in the ED.      Final Clinical Impression(s) / ED Diagnoses Final diagnoses:  Cough  Close exposure to COVID-19 virus  COVID-19    Rx / DC Orders ED Discharge Orders         Ordered    benzonatate (TESSALON) 100 MG capsule  Every 8 hours        04/10/20 1420           Loni Beckwith, PA-C 04/10/20 [place], DO 04/13/20 2303

## 2020-04-10 NOTE — Discharge Instructions (Addendum)
You came to the hospital today to be evaluated for your COVID-19 exposure and associated symptoms of cough, headache, congestion and fatigue.  Your chest x-ray and lab work were reassuring.  Your COVID-19 test is still pending.  If this test is positive you will be contacted by phone.  You can also look up your test result in MyChart.    If you test positive for 19 you will be contacted for possible monoclonal antibody treatment.  The risk and benefits of this treatment will be discussed with you and if you decide you can complete this treatment as an outpatient.  Please make sure to stay well-hydrated by drinking water.  If you have any new or worsening symptoms please seek additional medical care and evaluation.     Get help right away if: You have trouble breathing. You have pain or pressure in your chest. You have confusion. You have bluish lips and fingernails. You have difficulty waking from sleep. You have symptoms that get worse.      Person Under Monitoring Name: [name]  Location: [place] [address]   Infection Prevention Recommendations for Individuals Confirmed to have, or Being Evaluated for, 2019 Novel Coronavirus (COVID-19) Infection Who Receive Care at Home  Individuals who are confirmed to have, or are being evaluated for, COVID-19 should follow the prevention steps below until a healthcare provider or local or state health department says they can return to normal activities.  Stay home except to get medical care You should restrict activities outside your home, except for getting medical care. Do not go to work, school, or public areas, and do not use public transportation or taxis.  Call ahead before visiting your doctor Before your medical appointment, call the healthcare provider and tell them that you have, or are being evaluated for, COVID-19 infection. This will help the healthcare provider's office take steps to  keep other people from getting infected. Ask your healthcare provider to call the local or state health department.  Monitor your symptoms Seek prompt medical attention if your illness is worsening (e.g., difficulty breathing). Before going to your medical appointment, call the healthcare provider and tell them that you have, or are being evaluated for, COVID-19 infection. Ask your healthcare provider to call the local or state health department.  Wear a facemask You should wear a facemask that covers your nose and mouth when you are in the same room with other people and when you visit a healthcare provider. People who live with or visit you should also wear a facemask while they are in the same room with you.  Separate yourself from other people in your home As much as possible, you should stay in a different room from other people in your home. Also, you should use a separate bathroom, if available.  Avoid sharing household items You should not share dishes, drinking glasses, cups, eating utensils, towels, bedding, or other items with other people in your home. After using these items, you should wash them thoroughly with soap and water.  Cover your coughs and sneezes Cover your mouth and nose with a tissue when you cough or sneeze, or you can cough or sneeze into your sleeve. Throw used tissues in a lined trash can, and immediately wash your hands with soap and water for at least 20 seconds or use an alcohol-based hand rub.  Wash your [facility] your hands often and thoroughly with soap and water for at least 20 seconds. You  can use an alcohol-based hand sanitizer if soap and water are not available and if your hands are not visibly dirty. Avoid touching your eyes, nose, and mouth with unwashed hands.   Prevention Steps for Caregivers and Household Members of Individuals Confirmed to have, or Being Evaluated for, COVID-19 Infection Being Cared for in the Home  If you live with,  or provide care at home for, a person confirmed to have, or being evaluated for, COVID-19 infection please follow these guidelines to prevent infection:  Follow healthcare provider's instructions Make sure that you understand and can help the patient follow any healthcare provider instructions for all care.  Provide for the patient's basic needs You should help the patient with basic needs in the home and provide support for getting groceries, prescriptions, and other personal needs.  Monitor the patient's symptoms If they are getting sicker, call his or her medical provider and tell them that the patient has, or is being evaluated for, COVID-19 infection. This will help the healthcare provider's office take steps to keep other people from getting infected. Ask the healthcare provider to call the local or state health department.  Limit the number of people who have contact with the patient If possible, have only one caregiver for the patient. Other household members should stay in another home or place of residence. If this is not possible, they should stay in another room, or be separated from the patient as much as possible. Use a separate bathroom, if available. Restrict visitors who do not have an essential need to be in the home.  Keep older adults, very young children, and other sick people away from the patient Keep older adults, very young children, and those who have compromised immune systems or chronic health conditions away from the patient. This includes people with chronic heart, lung, or kidney conditions, diabetes, and cancer.  Ensure good ventilation Make sure that shared spaces in the home have good air flow, such as from an air conditioner or an opened window, weather permitting.  Wash your hands often Wash your hands often and thoroughly with soap and water for at least 20 seconds. You can use an alcohol based hand sanitizer if soap and water are not available and if  your hands are not visibly dirty. Avoid touching your eyes, nose, and mouth with unwashed hands. Use disposable paper towels to dry your hands. If not available, use dedicated cloth towels and replace them when they become wet.  Wear a facemask and gloves Wear a disposable facemask at all times in the room and gloves when you touch or have contact with the patient's blood, body fluids, and/or secretions or excretions, such as sweat, saliva, sputum, nasal mucus, vomit, urine, or feces.  Ensure the mask fits over your nose and mouth tightly, and do not touch it during use. Throw out disposable facemasks and gloves after using them. Do not reuse. Wash your hands immediately after removing your facemask and gloves. If your personal clothing becomes contaminated, carefully remove clothing and launder. Wash your hands after handling contaminated clothing. Place all used disposable facemasks, gloves, and other waste in a lined container before disposing them with other household waste. Remove gloves and wash your hands immediately after handling these items.  Do not share dishes, glasses, or other household items with the patient Avoid sharing household items. You should not share dishes, drinking glasses, cups, eating utensils, towels, bedding, or other items with a patient who is confirmed to have, or being  evaluated for, COVID-19 infection. After the person uses these items, you should wash them thoroughly with soap and water.  Wash laundry thoroughly Immediately remove and wash clothes or bedding that have blood, body fluids, and/or secretions or excretions, such as sweat, saliva, sputum, nasal mucus, vomit, urine, or feces, on them. Wear gloves when handling laundry from the patient. Read and follow directions on labels of laundry or clothing items and detergent. In general, wash and dry with the warmest temperatures recommended on the label.  Clean all areas the individual has used often Clean  all touchable surfaces, such as counters, tabletops, doorknobs, bathroom fixtures, toilets, phones, keyboards, tablets, and bedside tables, every day. Also, clean any surfaces that may have blood, body fluids, and/or secretions or excretions on them. Wear gloves when cleaning surfaces the patient has come in contact with. Use a diluted bleach solution (e.g., dilute bleach with 1 part bleach and 10 parts water) or a household disinfectant with a label that says EPA-registered for coronaviruses. To make a bleach solution at home, add 1 tablespoon of bleach to 1 quart (4 cups) of water. For a larger supply, add  cup of bleach to 1 gallon (16 cups) of water. Read labels of cleaning products and follow recommendations provided on product labels. Labels contain instructions for safe and effective use of the cleaning product including precautions you should take when applying the product, such as wearing gloves or eye protection and making sure you have good ventilation during use of the product. Remove gloves and wash hands immediately after cleaning.  Monitor yourself for signs and symptoms of illness Caregivers and household members are considered close contacts, should monitor their health, and will be asked to limit movement outside of the home to the extent possible. Follow the monitoring steps for close contacts listed on the symptom monitoring form.   ? If you have additional questions, contact your local health department or call the epidemiologist on call at [phone] (available 24/7). ? This guidance is subject to change. For the most up-to-date guidance from [facility], please refer to their website: [email or web address]

## 2020-04-10 NOTE — ED Notes (Signed)
Spoke with patient's daughter who reports she is on the way to transport patient. Offered patient to wait in room for daughter's arrival. Patient reports she wants to wait outside. Daughter made aware patient will be waiting on bench outside.

## 2020-04-10 NOTE — ED Notes (Signed)
Patient ambulatory in room without difficulty. Oxygen saturation remained 98-100%. Patient denies SOB.

## 2020-04-11 ENCOUNTER — Encounter: Payer: Self-pay | Admitting: Nurse Practitioner

## 2020-04-11 ENCOUNTER — Other Ambulatory Visit: Payer: Self-pay | Admitting: Nurse Practitioner

## 2020-04-11 DIAGNOSIS — I10 Essential (primary) hypertension: Secondary | ICD-10-CM

## 2020-04-11 DIAGNOSIS — I509 Heart failure, unspecified: Secondary | ICD-10-CM

## 2020-04-11 DIAGNOSIS — I2510 Atherosclerotic heart disease of native coronary artery without angina pectoris: Secondary | ICD-10-CM

## 2020-04-11 DIAGNOSIS — U071 COVID-19: Secondary | ICD-10-CM

## 2020-04-11 DIAGNOSIS — E119 Type 2 diabetes mellitus without complications: Secondary | ICD-10-CM | POA: Insufficient documentation

## 2020-04-11 NOTE — Progress Notes (Signed)
I connected by phone with [name] on 04/11/2020 at 10:03 AM to discuss the potential use of a new treatment for mild to moderate COVID-19 viral infection in non-hospitalized patients.  This patient is a 74 y.o. female that meets the FDA criteria for Emergency Use Authorization of COVID monoclonal antibody REGEN-COV.  Has a (+) direct SARS-CoV-2 viral test result  Has mild or moderate COVID-19   Is NOT hospitalized due to COVID-19  Is within 10 days of symptom onset  Has at least one of the high risk factor(s) for progression to severe COVID-19 and/or hospitalization as defined in EUA.  Specific high risk criteria : Older age (>/= 73 yo), Chronic Kidney Disease (CKD), Diabetes and Cardiovascular disease or hypertension   I have spoken and communicated the following to the patient or parent/caregiver regarding COVID monoclonal antibody treatment:  1. FDA has authorized the emergency use for the treatment of mild to moderate COVID-19 in adults and pediatric patients with positive results of direct SARS-CoV-2 viral testing who are 13 years of age and older weighing at least 40 kg, and who are at high risk for progressing to severe COVID-19 and/or hospitalization.  2. The significant known and potential risks and benefits of COVID monoclonal antibody, and the extent to which such potential risks and benefits are unknown.  3. Information on available alternative treatments and the risks and benefits of those alternatives, including clinical trials.  4. Patients treated with COVID monoclonal antibody should continue to self-isolate and use infection control measures (e.g., wear mask, isolate, social distance, avoid sharing personal items, clean and disinfect "high touch" surfaces, and frequent handwashing) according to CDC guidelines.   5. The patient or parent/caregiver has the option to accept or refuse COVID monoclonal antibody treatment.  After reviewing this information with  the patient, the patient has agreed to receive one of the available covid 19 monoclonal antibodies and will be provided an appropriate fact sheet prior to infusion.  Murray Hodgkins, NP 04/11/2020 10:03 AM

## 2020-04-13 ENCOUNTER — Ambulatory Visit (HOSPITAL_COMMUNITY)
Admission: RE | Admit: 2020-04-13 | Discharge: 2020-04-13 | Disposition: A | Discharge status: Home / Self Care | Payer: Medicare Other | Source: Ambulatory Visit | Attending: Pulmonary Disease | Admitting: Pulmonary Disease

## 2020-04-13 DIAGNOSIS — I2510 Atherosclerotic heart disease of native coronary artery without angina pectoris: Secondary | ICD-10-CM | POA: Diagnosis not present

## 2020-04-13 DIAGNOSIS — U071 COVID-19: Secondary | ICD-10-CM | POA: Diagnosis not present

## 2020-04-13 DIAGNOSIS — Z23 Encounter for immunization: Secondary | ICD-10-CM | POA: Insufficient documentation

## 2020-04-13 DIAGNOSIS — I10 Essential (primary) hypertension: Secondary | ICD-10-CM | POA: Diagnosis not present

## 2020-04-13 DIAGNOSIS — I509 Heart failure, unspecified: Secondary | ICD-10-CM | POA: Diagnosis not present

## 2020-04-13 MED ORDER — FAMOTIDINE IN NACL 20-0.9 MG/50ML-% IV SOLN: 20.0000 mg | Freq: Once | INTRAVENOUS | Status: DC | PRN

## 2020-04-13 MED ORDER — SODIUM CHLORIDE 0.9 % IV SOLN: INTRAVENOUS | Status: DC | PRN

## 2020-04-13 MED ORDER — SODIUM CHLORIDE 0.9 % IV SOLN: Freq: Once | INTRAVENOUS | Status: AC

## 2020-04-13 MED ORDER — ALBUTEROL SULFATE HFA 108 (90 BASE) MCG/ACT IN AERS: 2.0000 | INHALATION_SPRAY | Freq: Once | RESPIRATORY_TRACT | Status: DC | PRN

## 2020-04-13 MED ORDER — METHYLPREDNISOLONE SODIUM SUCC 125 MG IJ SOLR: 125.0000 mg | Freq: Once | INTRAMUSCULAR | Status: DC | PRN

## 2020-04-13 MED ORDER — DIPHENHYDRAMINE HCL 50 MG/ML IJ SOLN: 50.0000 mg | Freq: Once | INTRAMUSCULAR | Status: DC | PRN

## 2020-04-13 MED ORDER — EPINEPHRINE 0.3 MG/0.3ML IJ SOAJ: 0.3000 mg | Freq: Once | INTRAMUSCULAR | Status: DC | PRN

## 2020-04-13 NOTE — Progress Notes (Signed)
  Diagnosis: COVID-19  Physician: Dr. Joya Gaskins  Procedure: [facility] Med: remdesivir infusion - Provided patient with remdesivir fact sheet for patients, parents and caregivers prior to infusion.  Complications: No immediate complications noted.  Discharge: Discharged home   Hulan Fess 04/13/2020

## 2020-04-13 NOTE — Discharge Instructions (Signed)
10 Things You Can Do to Manage Your COVID-19 Symptoms at Home If you have possible or confirmed COVID-19: 1. Stay home from work and school. And stay away from other public places. If you must go out, avoid using any kind of public transportation, ridesharing, or taxis. 2. Monitor your symptoms carefully. If your symptoms get worse, call your healthcare provider immediately. 3. Get rest and stay hydrated. 4. If you have a medical appointment, call the healthcare provider ahead of time and tell them that you have or may have COVID-19. 5. For medical emergencies, call 911 and notify the dispatch personnel that you have or may have COVID-19. 6. Cover your cough and sneezes with a tissue or use the inside of your elbow. 7. Wash your hands often with soap and water for at least 20 seconds or clean your hands with an alcohol-based hand sanitizer that contains at least 60% alcohol. 8. As much as possible, stay in a specific room and away from other people in your home. Also, you should use a separate bathroom, if available. If you need to be around other people in or outside of the home, wear a mask. 9. Avoid sharing personal items with other people in your household, like dishes, towels, and bedding. 10. Clean all surfaces that are touched often, like counters, tabletops, and doorknobs. Use household cleaning sprays or wipes according to the label instructions. cdc.gov/coronavirus 11/07/2018 This information is not intended to replace advice given to you by your health care provider. Make sure you discuss any questions you have with your health care provider. Document Revised: 04/11/2019 Document Reviewed: 04/11/2019 Elsevier Patient Education  2020 Elsevier Inc. What types of side effects do monoclonal antibody drugs cause?  Common side effects  In general, the more common side effects caused by monoclonal antibody drugs include: . Allergic reactions, such as hives or itching . Flu-like signs and  symptoms, including chills, fatigue, fever, and muscle aches and pains . Nausea, vomiting . Diarrhea . Skin rashes . Low blood pressure   The CDC is recommending patients who receive monoclonal antibody treatments wait at least 90 days before being vaccinated.  Currently, there are no data on the safety and efficacy of mRNA COVID-19 vaccines in persons who received monoclonal antibodies or convalescent plasma as part of COVID-19 treatment. Based on the estimated half-life of such therapies as well as evidence suggesting that reinfection is uncommon in the 90 days after initial infection, vaccination should be deferred for at least 90 days, as a precautionary measure until additional information becomes available, to avoid interference of the antibody treatment with vaccine-induced immune responses. If you have any questions or concerns after the infusion please call the Advanced Practice Provider on call at [phone]. This number is ONLY intended for your use regarding questions or concerns about the infusion post-treatment side-effects.  Please do not provide this number to others for use. For return to work notes please contact your primary care provider.   If someone you know is interested in receiving treatment please have them call the COVID hotline at [phone].   

## 2020-04-13 NOTE — Progress Notes (Signed)
Patient reviewed Fact Sheet for Patients, Parents, and Caregivers for Emergency Use Authorization (EUA) of REGEN-COV2 for the Treatment of Coronavirus. Patient also reviewed and is agreeable to the estimated cost of treatment. Patient is agreeable to proceed.

## 2020-04-16 ENCOUNTER — Other Ambulatory Visit: Payer: Self-pay | Admitting: *Deleted

## 2020-04-16 NOTE — Patient Outreach (Signed)
[facility]) Care Management  04/16/2020  Berlin Viereck 04/25/1946 [id number]   Outreach attempt #4, successful to son [name].  State member was recently diagnosed with Covid, received antibody infusion and has done well with her recovery.  She was not hospitalized, denies any severe symptoms, only congestion and body aches.  They have continued to monitor blood sugars, state range has been 120-170's, adherence to diet remains an issue at times.  Denies member has had any recent confusion warranting concern for elevated ammonia levels.  Has follow up with PCP in February, expecting to have A1C drawn at that time.  Denies any urgent concerns, encouraged to contact this care manager with questions.  Agrees to follow up within the next month.  Goals Addressed            This Visit's Progress   . COMPLETED: [facility] - Make and Keep All Appointments       Follow Up Date 11/2   - ask family or friend for a ride - keep a calendar with appointment dates    Why is this important?   Part of staying healthy is seeing the doctor for follow-up care.  If you forget your appointments, there are some things you can do to stay on track.    Notes: PCP scheduled for 10/7    . [facility] - Monitor and Manage My Blood Sugar   On track    Follow Up Date 05/17/2020   - check blood sugar at prescribed times - check blood sugar if I feel it is too high or too low - take the blood sugar log to all doctor visits    Why is this important?   Checking your blood sugar at home helps to keep it from getting very high or very low.  Writing the results in a diary or log helps the doctor know how to care for you.  Your blood sugar log should have the time, date and the results.  Also, write down the amount of insulin or other medicine that you take.  Other information, like what you ate, exercise done and how you were feeling, will also be helpful.     Notes:   12/9 - Reminded of proper diabetes  diet, reviewed medications for glucose control    . [facility] - Set My Target A1C   On track    Follow Up Date 05/17/2020   - set target A1C - less than 8    Why is this important?   Your target A1C is decided together by you and your doctor.  It is based on several things like your age and other health issues.    Notes:   12/9 - Reviewed upcoming appointment with PCP and fasting labs, preparation for new A1C      [name], RN, MSN [place] Manager [phone]

## 2020-04-17 ENCOUNTER — Telehealth: Payer: Self-pay

## 2020-04-17 NOTE — Telephone Encounter (Signed)
Outgoing call placed to patient to inform her that we received an incoming package via UPS from [name] for [employer].   Container size: 5 x 3 mL Prefilled Pens  Strength: 100 units/mL Dosage form: Injection, solution Lot: [id number] Exp: 06/08/2022 Unit Quanity: 2  Patient states she currently has covid and she will send a family member to pick-up medication. Medication is located in the refrigerator with patients name on it.

## 2020-04-25 ENCOUNTER — Other Ambulatory Visit: Payer: Self-pay | Admitting: Gastroenterology

## 2020-04-28 ENCOUNTER — Other Ambulatory Visit: Payer: Self-pay | Admitting: Cardiovascular Disease

## 2020-05-12 ENCOUNTER — Encounter: Payer: Self-pay | Admitting: Gastroenterology

## 2020-05-12 ENCOUNTER — Other Ambulatory Visit (INDEPENDENT_AMBULATORY_CARE_PROVIDER_SITE_OTHER): Payer: Medicare Other

## 2020-05-12 ENCOUNTER — Telehealth: Payer: Self-pay | Admitting: Internal Medicine

## 2020-05-12 ENCOUNTER — Ambulatory Visit (INDEPENDENT_AMBULATORY_CARE_PROVIDER_SITE_OTHER): Payer: Medicare Other | Admitting: Gastroenterology

## 2020-05-12 VITALS — BP 114/58 | HR 70 | Ht 63.0 in | Wt 129.0 lb

## 2020-05-12 DIAGNOSIS — K7460 Unspecified cirrhosis of liver: Secondary | ICD-10-CM

## 2020-05-12 LAB — CBC
HCT: 40.7 % (ref 36.0–46.0)
Hemoglobin: 13.5 g/dL (ref 12.0–15.0)
MCHC: 33.2 g/dL (ref 30.0–36.0)
MCV: 93 fl (ref 78.0–100.0)
Platelets: 98 10*3/uL — ABNORMAL LOW (ref 150.0–400.0)
RBC: 4.37 Mil/uL (ref 3.87–5.11)
RDW: 16.1 % — ABNORMAL HIGH (ref 11.5–15.5)
WBC: 3.9 10*3/uL — ABNORMAL LOW (ref 4.0–10.5)

## 2020-05-12 LAB — COMPREHENSIVE METABOLIC PANEL
ALT: 35 U/L (ref 0–35)
AST: 47 U/L — ABNORMAL HIGH (ref 0–37)
Albumin: 3.4 g/dL — ABNORMAL LOW (ref 3.5–5.2)
Alkaline Phosphatase: 98 U/L (ref 39–117)
BUN: 15 mg/dL (ref 6–23)
CO2: 26 mEq/L (ref 19–32)
Calcium: 9.5 mg/dL (ref 8.4–10.5)
Chloride: 104 mEq/L (ref 96–112)
Creatinine, Ser: 0.8 mg/dL (ref 0.40–1.20)
GFR: 73.09 mL/min (ref >60.00)
Glucose, Bld: 220 mg/dL — ABNORMAL HIGH (ref 70–99)
Potassium: 3.5 mEq/L (ref 3.5–5.1)
Sodium: 139 mEq/L (ref 135–145)
Total Bilirubin: 1.6 mg/dL — ABNORMAL HIGH (ref 0.2–1.2)
Total Protein: 6.8 g/dL (ref 6.0–8.3)

## 2020-05-12 LAB — PROTIME-INR
INR: 1.1 ratio — ABNORMAL HIGH (ref 0.8–1.0)
Prothrombin Time: 12 s (ref 9.6–13.1)

## 2020-05-12 NOTE — Telephone Encounter (Signed)
Called patient to schedule AWV. Left vm.

## 2020-05-12 NOTE — Progress Notes (Signed)
Review of pertinent gastrointestinal problems: 1. Personal history of adenomatous colon polyps. Colonoscopy Dr. [name] 2012 August was normal except for 2 subcentimeter polyps, one was a tubular adenoma. She was recommended to have recall colonoscopy at five-year interval. She had also had previous colonoscopies with Dr. Lajoyce Corners and found to have adenomas as well.Colonoscopy Dr. [name] 07/2016 one subCM TA, recall at 5 years 2. Cirrhosis: likely from Fatty liver  Paracentesis 11/20174 liters, elevated SAAG, no SBP, cytology neg for malignancy  imaging:03/2017 [place]: cirrhosis, splenomegaly, NEW partial (non-occlusive) PV thrombus, Gallstones and new "mild dilation of intra and extrahepatic bile ducts"; MRI 03/2017 biliary findings probably from extensive periportal fibrosis, small gallstones in GB, non-occlusive PV thrombus appears chronic.US 10/2017: gallstones, thick GB, cirrhosis without masses in liver.04/2018 ultrasound cirrhosis without focal hepatic lesions.MRI January 2021 showed a new 3 cm mass that was "indeterminate for neoplasm". Interventional radiology review felt this was possibly a biloma. Follow-up MRI April 2021 arranged by interventional radiology.  MRI abdomen April 2021 no suspicious lesions in the liver to suggest hepatocellular carcinoma.  Most recent AFP:1/2021normal  MELD-NA;04/2018 labs : 10  07/2016 IYM:EBRAX varices, + portal gastropathy; started nadolol after discussion with cardiology(nadolol 81m once dialy); no recall necessary since on Nadolol.EGD November 2020showed persistent large varices,banded with ligating bands. 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  Very large varices causing dysphagia despite 80 of nadolol daily. EGD November 2020 placement of 14 variceal ligating bands. This was complicated by  significant chest pain and dysphagia afterwards requiring hospital admission. Further complicated by bleeding from band ulcer site and eventual TIPS placement November 2020.  She had significant encephalopathy issues following the TIPS but these eventually waned.  Doing well on lactulose 45 mL once daily and Xifaxan 550 twice daily as of July 2021.   HPI: This is a very pleasant 75year old woman whom I last saw 6 months ago here in the office.  She has done very well in the interim.  She has had no issues with encephalopathy.  No issues with ascites or edema, no vomiting blood or passing over blood per rectum.  She actually stopped taking her lactulose syrup.  She does still take Xifaxan twice daily.  She has 1-2 BMs daily, sometimes she is a bit constipated admittedly.  Her weight is down 8 pounds since her last visit here 6 months ago.  ROS: complete GI ROS as described in HPI, all other review negative.  Constitutional:  No unintentional weight loss   Past Medical History:  Diagnosis Date  . A-fib ([place]    08/19/19 afib with RVR, spontaneously converted to SR  . Allergy   . Arthritis    neck  . Cataract    bilateral - MD monitoring cataracts  . CHF (congestive heart failure) ([facility]   . Chronic kidney disease, stage I    DR OTTELIN  HX UTIS  . Cirrhosis ([facility]   . Coronary artery disease    s/p CABG 01/29/16: LIMA-LAD, SVG-RI, SVG-dRCA  . COVID-19 virus infection 04/2020  . Cramp of limb   . Diabetes mellitus   . Dysphagia, unspecified(787.20)   . Dysuria   . Epistaxis   . GERD (gastroesophageal reflux disease)   . Hearing loss    wears hearing aids  . Heart murmur     DX FOR YEARS ASYMPTOMATIC  . Hepatic encephalopathy ([place]   .  Lumbago   . Myocardial infarction ([place])    NSTEMI 01/27/16  . Neoplasm of uncertain behavior of skin   . Nonspecific elevation of levels of transaminase or lactic acid dehydrogenase (LDH)   . Osteoarthrosis, unspecified whether generalized or  localized, unspecified site   . Other and unspecified hyperlipidemia    diet controlled  . Pain in joint, shoulder region   . Paresthesias 04/01/2015  . Postablative ovarian failure   . Trochanteric bursitis of left hip 12/15/2015  . Type 2 diabetes mellitus without complication ([place])   . Unspecified essential hypertension    no meds  . Wears glasses     Past Surgical History:  Procedure Laterality Date  . BACK SURGERY  06/2015  . BREAST BIOPSY Left   . CARDIAC CATHETERIZATION N/A 01/27/2016   Procedure: Left Heart Cath and Coronary Angiography;  Surgeon: Belva Crome, MD;  Location: [place] [facility];  Service: Cardiovascular;  Laterality: N/A;  . COLONOSCOPY  2012   Dr Lajoyce Corners.   . COLONOSCOPY WITH PROPOFOL N/A 07/07/2016   Procedure: COLONOSCOPY WITH PROPOFOL;  Surgeon: [name], MD;  Location: [facility];  Service: Endoscopy;  Laterality: N/A;  . CORONARY ARTERY BYPASS GRAFT N/A 01/28/2016   Procedure: CORONARY ARTERY BYPASS GRAFTING (CABG) x 3 USING RIGHT LEG GREATER SAPHENOUS VEIN GRAFT;  Surgeon: Melrose Nakayama, MD;  Location: [place];  Service: Open Heart Surgery;  Laterality: N/A;  . ENDOVEIN HARVEST OF GREATER SAPHENOUS VEIN Right 01/28/2016   Procedure: ENDOVEIN HARVEST OF GREATER SAPHENOUS VEIN;  Surgeon: Melrose Nakayama, MD;  Location: [place];  Service: Open Heart Surgery;  Laterality: Right;  . ESOPHAGEAL BANDING  03/28/2019   Procedure: ESOPHAGEAL BANDING;  Surgeon: [name], MD;  Location: [facility];  Service: Endoscopy;;  . ESOPHAGEAL BANDING  04/07/2019   Procedure: ESOPHAGEAL BANDING;  Surgeon: Juanita Craver, MD;  Location: [name];  Service: Endoscopy;;  . ESOPHAGOGASTRODUODENOSCOPY N/A 04/07/2019   Procedure: ESOPHAGOGASTRODUODENOSCOPY (EGD);  Surgeon: Juanita Craver, MD;  Location: [facility]COPY;  Service: Endoscopy;  Laterality: N/A;  . ESOPHAGOGASTRODUODENOSCOPY (EGD) WITH PROPOFOL N/A 07/07/2016   Procedure: ESOPHAGOGASTRODUODENOSCOPY (EGD)  WITH PROPOFOL;  Surgeon: [name], MD;  [facility];  Service: Endoscopy;  Laterality: N/A;  . ESOPHAGOGASTRODUODENOSCOPY (EGD) WITH PROPOFOL N/A 03/28/2019   Procedure: ESOPHAGOGASTRODUODENOSCOPY (EGD) WITH PROPOFOL;  Surgeon: [name], MD;  [facility];  Service: Endoscopy;  Laterality: N/A;  . HEMOSTASIS CLIP PLACEMENT  04/07/2019   Procedure: HEMOSTASIS CLIP PLACEMENT;  Surgeon: Juanita Craver, MD;  Location: [place] ENDOSCOPY;  Service: Endoscopy;;  . IR ANGIOGRAM SELECTIVE EACH ADDITIONAL VESSEL  04/08/2019  . IR EMBO ART  VEN HEMORR LYMPH EXTRAV  INC GUIDE ROADMAPPING  04/08/2019  . IR PARACENTESIS  04/08/2019  . IR RADIOLOGIST EVAL & MGMT  06/13/2019  . IR RADIOLOGIST EVAL & MGMT  09/25/2019  . IR TIPS  04/08/2019  . MAXIMUM ACCESS (MAS)POSTERIOR LUMBAR INTERBODY FUSION (PLIF) 1 LEVEL Left 06/10/2015   Procedure: FOR MAXIMUM ACCESS (MAS) POSTERIOR LUMBAR INTERBODY FUSION (PLIF) LUMBAR THREE-FOUR EXTRAFORAMINAL MICRODISCECTOMY LUMBAR FIVE-SACRAL ONE LEFT;  Surgeon: Eustace Moore, MD;  Location: [place] [facility];  Service: Neurosurgery;  Laterality: Left;  . RADIOLOGY WITH ANESTHESIA N/A 04/08/2019   Procedure: RADIOLOGY WITH ANESTHESIA;  Surgeon: Radiologist, Medication, MD;  Location: [place];  Service: Radiology;  Laterality: N/A;  . SCLEROTHERAPY  04/07/2019   Procedure: SCLEROTHERAPY;  Surgeon: Juanita Craver, MD;  Location: [facility] ENDOSCOPY;  Service: Endoscopy;;  .  TEE WITHOUT CARDIOVERSION N/A 01/28/2016   Procedure: TRANSESOPHAGEAL ECHOCARDIOGRAM (TEE);  Surgeon: Melrose Nakayama, MD;  Location: [place];  Service: Open Heart Surgery;  Laterality: N/A;  . TUBAL LIGATION  1982   Dr Connye Burkitt  . UMBILICAL HERNIA REPAIR N/A 09/10/2019   Procedure: HERNIA REPAIR UMBILICAL ADULT;  Surgeon: Donnie Mesa, MD;  Location: [place];  Service: General;  Laterality: N/A;  . UPPER GASTROINTESTINAL ENDOSCOPY    . VAGINAL HYSTERECTOMY  1997   Dr Rande Lawman    Current Outpatient  Medications  Medication Sig Dispense Refill  . acetaminophen (TYLENOL) 500 MG tablet Take 500 mg by mouth at bedtime.     . benzonatate (TESSALON) 100 MG capsule Take 1 capsule (100 mg total) by mouth every 8 (eight) hours. 21 capsule 0  . Biotin 10000 MCG TABS Take 10,000 mcg by mouth every morning.    . colchicine 0.6 MG tablet Take 1 tablet (0.6 mg total) by mouth daily. Discontinue after 3 more months 90 tablet 0  . Cyanocobalamin (VITAMIN B 12 PO) Take 1,000 mcg by mouth daily.      [name] Kitchen ezetimibe (ZETIA) 10 MG tablet TAKE 1 TABLET BY MOUTH EVERY DAY 90 tablet 1  . furosemide (LASIX) 40 MG tablet Take 1 tablet (40 mg total) by mouth daily. Please make overdue appt with Dr. Johnsie Cancel before anymore refills. Thank you 3rd and Final Attempt 15 tablet 0  . glucose blood test strip One Touch Ultra II strips. Use to test blood sugar three times daily. Dx: E11.65 300 each 3  . Insulin Pen Needle (BD PEN NEEDLE NANO U/F) 32G X 4 MM MISC USE THREE TIMES DAILY AS DIRECTED 100 each 6  . Insulin Syringe-Needle U-100 (INSULIN SYRINGE 1CC/31GX5/16") 31G X 5/16" 1 ML MISC USE AS DIRECTED DAILY WITH LEVEMIR. Dx: E11.65 100 each 2  . JARDIANCE 25 MG TABS tablet TAKE 1 TABLET BY MOUTH DAILY 90 tablet 1  . lactulose (CHRONULAC) 10 GM/15ML solution TAKE 45 ML BY MOUTH THREE TIMES DAILY AS DIRECTED 4257 mL 1  . LEVEMIR FLEXTOUCH 100 UNIT/ML FlexPen ADMINISTER 30 UNITS UNDER THE SKIN AT BEDTIME 15 mL 3  . loratadine (CLARITIN) 10 MG tablet Take 10 mg by mouth daily as needed for allergies.    [name] Kitchen MAGNESIUM PO Take 500 mg by mouth daily.     . metoprolol tartrate (LOPRESSOR) 25 MG tablet Take 1 tablet (25 mg total) by mouth as needed (use as needed for HR greater than 110). 60 tablet 2  . Multiple Vitamins-Minerals (MULTIVITAMIN WITH MINERALS) tablet Take 1 tablet by mouth daily.      . pantoprazole (PROTONIX) 40 MG tablet TAKE 1 TABLET(40 MG) BY MOUTH DAILY 90 tablet 1  . Polyethyl Glycol-Propyl Glycol (SYSTANE OP) Place  1 drop into both eyes daily.     . Probiotic Product (PROBIOTIC DAILY PO) Take 1 capsule by mouth daily. Digestive Advantage Probiotic    . XIFAXAN 550 MG TABS tablet TAKE ONE TABLET BY MOUTH TWICE A DAY 180 tablet 2   No current facility-administered medications for this visit.    Allergies as of 05/12/2020 - Review Complete 05/12/2020  Allergen Reaction Noted  . Kiwi extract Anaphylaxis 05/28/2015  . Tdap [tetanus-diphth-acell pertussis] Swelling and Other (See Comments) 03/27/2013  . Statins Other (See Comments) 05/08/2016  . Latex Itching, Dermatitis, and Rash 12/22/2010  . Tramadol Nausea And Vomiting 05/28/2015    Family History  Problem Relation Age of Onset  . Lung cancer Father   .  Arthritis Sister   . Arthritis Brother   . Liver cancer Brother   . Heart disease Maternal Grandmother   . Heart disease Maternal Grandfather   . Heart disease Paternal Grandmother   . Heart disease Paternal Grandfather   . Breast cancer Mother   . Other Daughter        house fire  . Breast cancer Maternal Aunt   . Breast cancer Paternal Aunt   . Colon cancer Neg Hx   . Esophageal cancer Neg Hx   . Rectal cancer Neg Hx   . Stomach cancer Neg Hx     Social History   Socioeconomic History  . Marital status: Married    Spouse name: Not on file  . Number of children: 6  . Years of education: Not on file  . Highest education level: Not on file  Occupational History  . Occupation: retired  Tobacco Use  . Smoking status: Never Smoker  . Smokeless tobacco: Never Used  Vaping Use  . Vaping Use: Never used  Substance and Sexual Activity  . Alcohol use: No  . Drug use: No  . Sexual activity: Yes    Partners: Male    Birth control/protection: Post-menopausal, Surgical    Comment: Hysterectomy  Other Topics Concern  . Not on file  Social History Narrative   She is retired she is married and lives with her husband   6 children   No alcohol or tobacco never smoker no drug use    Social Determinants of Radio broadcast assistant Strain: Not on file  Food Insecurity: Not on file  Transportation Needs: Not on file  Physical Activity: Not on file  Stress: Not on file  Social Connections: Not on file  Intimate Partner Violence: Not on file     Physical Exam: BP (!) 114/58   Pulse 70   Ht 5' 3"  (1.6 m)   Wt 129 lb (58.5 kg)   LMP  (LMP Unknown)   BMI 22.85 kg/m  Constitutional: generally well-appearing Psychiatric: alert and oriented x3 Abdomen: soft, nontender, nondistended, no obvious ascites, no peritoneal signs, normal bowel sounds No peripheral edema noted in lower extremities  Assessment and plan: 75 y.o. female with decompensated cirrhosis  Clinically she has really evened out after her TIPS and post TIPS complications about a year ago.  She has no ascites, she has no overt bleeding.  She has no trouble with encephalopathy.  I did ask her to restart her lactulose at least a small dose 20 or 30 cc once daily to try to prevent future encephalopathy episodes.  She needs repeat labs today to check a new meld score including a CBC, complete metabolic profile, coags.  We will also order an alpha-fetoprotein.  She is due for hepatoma screening by MRI in April of this year.  We will arrange that as well.  At the very least she will return to see me in 6 months and sooner if needed.  Please see the "Patient Instructions" section for addition details about the plan.  Owens Loffler, MD [facility] 05/12/2020, 9:23 AM   Total time on date of encounter was 30 minutes (this included time spent preparing to see the patient reviewing records; obtaining and/or reviewing separately obtained history; performing a medically appropriate exam and/or evaluation; counseling and educating the patient and family if present; ordering medications, tests or procedures if applicable; and documenting clinical information in the health record).

## 2020-05-12 NOTE — Patient Instructions (Addendum)
If you are age 74 or older, your body mass index should be between 23-30. Your Body mass index is 22.85 kg/m. If this is out of the aforementioned range listed, please consider follow up with your Primary Care Provider.  Your provider has requested that you go to the basement level for lab work before leaving today. Press "B" on the elevator. The lab is located at the first door on the left as you exit the elevator.  Due to recent changes in healthcare laws, you may see the results of your imaging and laboratory studies on MyChart before your provider has had a chance to review them.  We understand that in some cases there may be results that are confusing or concerning to you. Not all laboratory results come back in the same time frame and the provider may be waiting for multiple results in order to interpret others.  Please give [place] 48 hours in order for your provider to thoroughly review all the results before contacting the office for clarification of your results.   You will have MRI-liver in April.  We will contact you to schedule the appointment.  You will follow up with our office in 6 months (July 2022).  We will contact you to schedule the appointment.  Please make sure to take Lactulose everyday.  Thank you for entrusting me with your care and choosing [facility].  Dr [name]

## 2020-05-13 LAB — AFP TUMOR MARKER: AFP-Tumor Marker: 2.5 ng/mL

## 2020-05-14 ENCOUNTER — Other Ambulatory Visit: Payer: Self-pay | Admitting: *Deleted

## 2020-05-14 NOTE — Patient Outreach (Signed)
[facility]) Care Management  [place]  05/14/2020   Hargun Spurling 1946/03/11 [id number]  Outgoing call placed to member's son to follow up on management of chronic medical conditions.  State her blood sugars have usually ranged between 120-180 however member did have a reading in the 200's a couple days ago.  She was seen in the GI office, son did not go with her to appointment.  State member and her husband has improved medically and wanted to start doing more for themselves.  He is concerned about this because they do not always make good choices regarding following recommended diet and taking medications as instructed when out (particularly member's lactulose).  He and their other children have discussed concern for complications with them and will continue to do so.  Inquired about patient assistance with [employer], state he submitted all of the paperwork, still haven't received an update on the status of application.  Will place new referral to pharmacy team for assistance.  Son does not have any questions at this time, encouraged to contact this care manager with questions.  Call then placed directly to member to provide ongoing education and discuss plan of care to decrease risk of admission, no answer, HIPAA compliant voice message left. Encounter Medications:  Outpatient Encounter Medications as of 05/14/2020  Medication Sig  . acetaminophen (TYLENOL) 500 MG tablet Take 500 mg by mouth at bedtime.   . benzonatate (TESSALON) 100 MG capsule Take 1 capsule (100 mg total) by mouth every 8 (eight) hours.  . Biotin 10000 MCG TABS Take 10,000 mcg by mouth every morning.  . colchicine 0.6 MG tablet Take 1 tablet (0.6 mg total) by mouth daily. Discontinue after 3 more months  . Cyanocobalamin (VITAMIN B 12 PO) Take 1,000 mcg by mouth daily.    [name] Kitchen ezetimibe (ZETIA) 10 MG tablet TAKE 1 TABLET BY MOUTH EVERY DAY  . furosemide (LASIX) 40 MG tablet Take 1 tablet (40 mg  total) by mouth daily. Please make overdue appt with Dr. Johnsie Cancel before anymore refills. Thank you 3rd and Final Attempt  . glucose blood test strip One Touch Ultra II strips. Use to test blood sugar three times daily. Dx: E11.65  . Insulin Pen Needle (BD PEN NEEDLE NANO U/F) 32G X 4 MM MISC USE THREE TIMES DAILY AS DIRECTED  . Insulin Syringe-Needle U-100 (INSULIN SYRINGE 1CC/31GX5/16") 31G X 5/16" 1 ML MISC USE AS DIRECTED DAILY WITH LEVEMIR. Dx: E11.65  . JARDIANCE 25 MG TABS tablet TAKE 1 TABLET BY MOUTH DAILY  . lactulose (CHRONULAC) 10 GM/15ML solution TAKE 45 ML BY MOUTH THREE TIMES DAILY AS DIRECTED  . LEVEMIR FLEXTOUCH 100 UNIT/ML FlexPen ADMINISTER 30 UNITS UNDER THE SKIN AT BEDTIME  . loratadine (CLARITIN) 10 MG tablet Take 10 mg by mouth daily as needed for allergies.  [name] Kitchen MAGNESIUM PO Take 500 mg by mouth daily.   . metoprolol tartrate (LOPRESSOR) 25 MG tablet Take 1 tablet (25 mg total) by mouth as needed (use as needed for HR greater than 110).  . Multiple Vitamins-Minerals (MULTIVITAMIN WITH MINERALS) tablet Take 1 tablet by mouth daily.    . pantoprazole (PROTONIX) 40 MG tablet TAKE 1 TABLET(40 MG) BY MOUTH DAILY  . Polyethyl Glycol-Propyl Glycol (SYSTANE OP) Place 1 drop into both eyes daily.   . Probiotic Product (PROBIOTIC DAILY PO) Take 1 capsule by mouth daily. Digestive Advantage Probiotic  . XIFAXAN 550 MG TABS tablet TAKE ONE TABLET BY MOUTH TWICE A DAY  No facility-administered encounter medications on file as of 05/14/2020.    Functional Status:  In your present state of health, do you have any difficulty performing the following activities: 09/10/2019 09/10/2019  Hearing? Y -  Comment both ears- hearing aids -  Vision? N -  Difficulty concentrating or making decisions? N -  Walking or climbing stairs? N -  Dressing or bathing? N -  Doing errands, shopping? - N  Some recent data might be hidden    Fall/Depression Screening: Fall Risk  02/13/2020 10/14/2019 08/26/2019   Falls in the past year? 0 0 0  Number falls in past yr: 0 0 1  Injury with Fall? 0 - 0   PHQ 2/9 Scores 02/13/2020 08/26/2019 06/12/2019 05/13/2019 04/19/2019 04/15/2019 10/04/2018  PHQ - 2 Score 0 0 0 0 0 0 0    Assessment:  Goals Addressed            This Visit's Progress   . [facility] - Monitor and Manage My Blood Sugar   On track    Follow Up Date 06/16/2020   Timeframe:  Short-Term Goal Priority:  High Start Date:      05/14/2020                       Expected End Date:    06/14/2020                   - check blood sugar at prescribed times - check blood sugar if I feel it is too high or too low - take the blood sugar log to all doctor visits    Why is this important?   Checking your blood sugar at home helps to keep it from getting very high or very low.  Writing the results in a diary or log helps the doctor know how to care for you.  Your blood sugar log should have the time, date and the results.  Also, write down the amount of insulin or other medicine that you take.  Other information, like what you ate, exercise done and how you were feeling, will also be helpful.     Notes:   12/9 - Reminded of proper diabetes diet, reviewed medications for glucose control  05/14/2020 - Advised son to continue to educate member regarding proper food choices    . [facility] - Set My Target A1C   On track    Follow Up Date 06/16/2020   Timeframe:  Long-Range Goal Priority:  Medium Start Date:        05/14/2020                     Expected End Date:      07/12/2020                 - set target A1C - less than 8    Why is this important?   Your target A1C is decided together by you and your doctor.  It is based on several things like your age and other health issues.    Notes:   12/9 - Reviewed upcoming appointment with PCP and fasting labs, preparation for new A1C  05/14/2020 - Reminded of labs on 2/2 and PCP visit on 2/7       Plan:  Follow-up:  Patient agrees to Care Plan and Follow-up.  Will  follow up within the next month.  [name], RN, MSN [place]  [phone]    

## 2020-05-14 NOTE — Patient Outreach (Signed)
[place] [facility]) Care Management  05/14/2020  [name] 1946/01/11 [id number]  Referral for medication assistance per [name], RN request sent to [place] on 05/14/20.  Ina Homes [facility] Management Assistant [phone]

## 2020-05-21 ENCOUNTER — Other Ambulatory Visit: Payer: Self-pay | Admitting: Internal Medicine

## 2020-06-04 ENCOUNTER — Other Ambulatory Visit: Payer: Self-pay | Admitting: Cardiovascular Disease

## 2020-06-10 ENCOUNTER — Other Ambulatory Visit: Payer: Self-pay

## 2020-06-10 ENCOUNTER — Other Ambulatory Visit: Payer: Medicare Other

## 2020-06-10 DIAGNOSIS — R188 Other ascites: Secondary | ICD-10-CM

## 2020-06-10 DIAGNOSIS — E785 Hyperlipidemia, unspecified: Secondary | ICD-10-CM

## 2020-06-10 DIAGNOSIS — Z794 Long term (current) use of insulin: Secondary | ICD-10-CM

## 2020-06-10 DIAGNOSIS — D696 Thrombocytopenia, unspecified: Secondary | ICD-10-CM | POA: Diagnosis not present

## 2020-06-10 DIAGNOSIS — K7460 Unspecified cirrhosis of liver: Secondary | ICD-10-CM | POA: Diagnosis not present

## 2020-06-10 DIAGNOSIS — E1169 Type 2 diabetes mellitus with other specified complication: Secondary | ICD-10-CM | POA: Diagnosis not present

## 2020-06-10 DIAGNOSIS — E1165 Type 2 diabetes mellitus with hyperglycemia: Secondary | ICD-10-CM | POA: Diagnosis not present

## 2020-06-10 DIAGNOSIS — I25118 Atherosclerotic heart disease of native coronary artery with other forms of angina pectoris: Secondary | ICD-10-CM | POA: Diagnosis not present

## 2020-06-10 DIAGNOSIS — L989 Disorder of the skin and subcutaneous tissue, unspecified: Secondary | ICD-10-CM | POA: Diagnosis not present

## 2020-06-10 NOTE — Progress Notes (Signed)
Virtual Visit via Telephone Note   This visit type was conducted due to national recommendations for restrictions regarding the COVID-19 Pandemic (e.g. social distancing) in an effort to limit this patient's exposure and mitigate transmission in our community.  Due to her co-morbid illnesses, this patient is at least at moderate risk for complications without adequate follow up.  This format is felt to be most appropriate for this patient at this time.  The patient did not have access to video technology/had technical difficulties with video requiring transitioning to audio format only (telephone).  All issues noted in this document were discussed and addressed.  No physical exam could be performed with this format.  Please refer to the patient's chart for her  consent to telehealth for [facility].    Date:  06/11/2020   ID:  Kayla, Conway 1945-06-15, MRN [id number] The patient was identified using 2 identifiers.  Patient Location: Home Provider Location: Home Office  PCP:  Gayland Curry, DO  Cardiologist:  [name], MD  Electrophysiologist:  None   Evaluation Performed:  Follow-Up Visit  Chief Complaint: 1 year follow up  History of Present Illness:    [name] is a 75 y.o. female with a history of HTN, HLD, DM2, CAD s/p CABG 2017 with LVEF at 53 to 65% 02/2016, ascites/cirrhosis, bilateral carotid artery disease and atrial fibrillation with RVR.  Patient had a STEMI in 2017 followed by CABG with LIMA to LAD, SVG to IM, SVG to PDA with an LVEF at that time at 55 to 60%. She had postop ascites and cirrhosis with multiple paracentesis followed by GI. Beta-blocker changed to nadolol. She takes spironolactone and Lasix. She saw Dr. Johnsie Cancel 07/2018 and was stable from a CV standpoint.  She was in the ED 08/19/2019 for chest pain at which she was found to have new onset atrial fibrillation. While in the ED, she spontaneously converted to normal sinus rhythm.  She was started on IV Lasix and IV heparin.  She was ultimately not placed on long-term anticoagulation due to history of portal vein thrombosis given a large varices and prior bleeding from band ulcer site s/p TIPS per Dr. [name] with GI.  Today she reports that she is doing well from a CV standpoint. She denies recent anginal symptoms. She was running low on her Lasix and was halving her tablets then completely ran out about 2 weeks ago with a 2.5lb weight gain. She denies SOB. We will provide this for her. She had COVID the beginning of the year. She continues to follow closely with GI and is scheduled for an abdominal MRI to assess her liver as a surveillance test.   The patient does not have symptoms concerning for COVID-19 infection (fever, chills, cough, or new shortness of breath).   Past Medical History:  Diagnosis Date  . A-fib ([place])    08/19/19 afib with RVR, spontaneously converted to SR  . Allergy   . Arthritis    neck  . Cataract    bilateral - MD monitoring cataracts  . CHF (congestive heart failure) ([place])   . Chronic kidney disease, stage I    DR OTTELIN  HX UTIS  . Cirrhosis ([place])   . Coronary artery disease    s/p CABG 01/29/16: LIMA-LAD, SVG-RI, SVG-dRCA  . COVID-19 virus infection 04/2020  . Cramp of limb   . Diabetes mellitus   . Dysphagia, unspecified(787.20)   . Dysuria   . Epistaxis   .  GERD (gastroesophageal reflux disease)   . Hearing loss    wears hearing aids  . Heart murmur     DX FOR YEARS ASYMPTOMATIC  . Hepatic encephalopathy ([place])   . Lumbago   . Myocardial infarction ([place])    NSTEMI 01/27/16  . Neoplasm of uncertain behavior of skin   . Nonspecific elevation of levels of transaminase or lactic acid dehydrogenase (LDH)   . Osteoarthrosis, unspecified whether generalized or localized, unspecified site   . Other and unspecified hyperlipidemia    diet controlled  . Pain in joint, shoulder region   . Paresthesias 04/01/2015  . Postablative  ovarian failure   . Trochanteric bursitis of left hip 12/15/2015  . Type 2 diabetes mellitus without complication ([place])   . Unspecified essential hypertension    no meds  . Wears glasses    Past Surgical History:  Procedure Laterality Date  . BACK SURGERY  06/2015  . BREAST BIOPSY Left   . CARDIAC CATHETERIZATION N/A 01/27/2016   Procedure: Left Heart Cath and Coronary Angiography;  Surgeon: Belva Crome, MD;  Location: [place] [facility];  Service: Cardiovascular;  Laterality: N/A;  . COLONOSCOPY  2012   Dr Lajoyce Corners.   . COLONOSCOPY WITH PROPOFOL N/A 07/07/2016   Procedure: COLONOSCOPY WITH PROPOFOL;  Surgeon: [name], MD;  Location: [facility];  Service: Endoscopy;  Laterality: N/A;  . CORONARY ARTERY BYPASS GRAFT N/A 01/28/2016   Procedure: CORONARY ARTERY BYPASS GRAFTING (CABG) x 3 USING RIGHT LEG GREATER SAPHENOUS VEIN GRAFT;  Surgeon: Melrose Nakayama, MD;  Location: [place];  Service: Open Heart Surgery;  Laterality: N/A;  . ENDOVEIN HARVEST OF GREATER SAPHENOUS VEIN Right 01/28/2016   Procedure: ENDOVEIN HARVEST OF GREATER SAPHENOUS VEIN;  Surgeon: Melrose Nakayama, MD;  Location: [place];  Service: Open Heart Surgery;  Laterality: Right;  . ESOPHAGEAL BANDING  03/28/2019   Procedure: ESOPHAGEAL BANDING;  Surgeon: [name], MD;  Location: [facility];  Service: Endoscopy;;  . ESOPHAGEAL BANDING  04/07/2019   Procedure: ESOPHAGEAL BANDING;  Surgeon: Juanita Craver, MD;  Location[facility] ENDOSCOPY;  Service: Endoscopy;;  . ESOPHAGOGASTRODUODENOSCOPY N/A 04/07/2019   Procedure: ESOPHAGOGASTRODUODENOSCOPY (EGD);  Surgeon: Juanita Craver, MD;  Location[facility] ENDOSCOPY;  Service: Endoscopy;  Laterality: N/A;  . ESOPHAGOGASTRODUODENOSCOPY (EGD) WITH PROPOFOL N/A 07/07/2016   Procedure: ESOPHAGOGASTRODUODENOSCOPY (EGD) WITH PROPOFOL;  Surgeon: [name], MD;  Location: [facility];  Service: Endoscopy;  Laterality: N/A;  . ESOPHAGOGASTRODUODENOSCOPY (EGD) WITH PROPOFOL N/A  03/28/2019   Procedure: ESOPHAGOGASTRODUODENOSCOPY (EGD) WITH PROPOFOL;  Surgeon: [name], MD;  Location: [facility];  Service: Endoscopy;  Laterality: N/A;  . HEMOSTASIS CLIP PLACEMENT  04/07/2019   Procedure: HEMOSTASIS CLIP PLACEMENT;  Surgeon: Juanita Craver, MD;  Location: [place] ENDOSCOPY;  Service: Endoscopy;;  . IR ANGIOGRAM SELECTIVE EACH ADDITIONAL VESSEL  04/08/2019  . IR EMBO ART  VEN HEMORR LYMPH EXTRAV  INC GUIDE ROADMAPPING  04/08/2019  . IR PARACENTESIS  04/08/2019  . IR RADIOLOGIST EVAL & MGMT  06/13/2019  . IR RADIOLOGIST EVAL & MGMT  09/25/2019  . IR TIPS  04/08/2019  . MAXIMUM ACCESS (MAS)POSTERIOR LUMBAR INTERBODY FUSION (PLIF) 1 LEVEL Left 06/10/2015   Procedure: FOR MAXIMUM ACCESS (MAS) POSTERIOR LUMBAR INTERBODY FUSION (PLIF) LUMBAR THREE-FOUR EXTRAFORAMINAL MICRODISCECTOMY LUMBAR FIVE-SACRAL ONE LEFT;  Surgeon: Eustace Moore, MD;  Location: [place] [facility]URO [facility]S;  Service: Neurosurgery;  Laterality: Left;  . RADIOLOGY WITH ANESTHESIA N/A 04/08/2019   Procedure: RADIOLOGY WITH ANESTHESIA;  Surgeon: Radiologist, Medication,  MD;  Location: [place];  Service: Radiology;  Laterality: N/A;  . SCLEROTHERAPY  04/07/2019   Procedure: SCLEROTHERAPY;  Surgeon: Juanita Craver, MD;  Location: [facility] ENDOSCOPY;  Service: Endoscopy;;  . TEE WITHOUT CARDIOVERSION N/A 01/28/2016   Procedure: TRANSESOPHAGEAL ECHOCARDIOGRAM (TEE);  Surgeon: Melrose Nakayama, MD;  Location: [place];  Service: Open Heart Surgery;  Laterality: N/A;  . TUBAL LIGATION  1982   Dr Connye Burkitt  . UMBILICAL HERNIA REPAIR N/A 09/10/2019   Procedure: HERNIA REPAIR UMBILICAL ADULT;  Surgeon: Donnie Mesa, MD;  Location: [place];  Service: General;  Laterality: N/A;  . UPPER GASTROINTESTINAL ENDOSCOPY    . VAGINAL HYSTERECTOMY  1997   Dr Rande Lawman     No outpatient medications have been marked as taking for the 06/11/20 encounter (Telemedicine) with Kayla Raymond, NP.     Allergies:   Kiwi extract, Tdap [tetanus-diphth-acell  pertussis], Statins, Latex, and Tramadol   Social History   Tobacco Use  . Smoking status: Never Smoker  . Smokeless tobacco: Never Used  Vaping Use  . Vaping Use: Never used  Substance Use Topics  . Alcohol use: No  . Drug use: No     Family Hx: The patient's family history includes Arthritis in her brother and sister; Breast cancer in her maternal aunt, mother, and paternal aunt; Heart disease in her maternal grandfather, maternal grandmother, paternal grandfather, and paternal grandmother; Liver cancer in her brother; Lung cancer in her father; Other in her daughter. There is no history of Colon cancer, Esophageal cancer, Rectal cancer, or Stomach cancer.  ROS:   Please see the history of present illness.     All other systems reviewed and are negative.  Prior CV studies:   The following studies were reviewed today:  Echo 02/2016 Study Conclusions   - Left ventricle: The cavity size was normal. There was mild  concentric hypertrophy. Systolic function was normal. The  estimated ejection fraction was in the range of 60% to 65%. Wall  motion was normal; there were no regional wall motion  abnormalities. There was an increased relative contribution of  atrial contraction to ventricular filling. Doppler parameters are  consistent with abnormal left ventricular relaxation (grade 1  diastolic dysfunction).  - Aortic valve: Trileaflet; normal thickness leaflets. Although the  mean AVG and peak velocity are consistent with very mild  stenosis, the aortic valve opens normally. There was mild  regurgitation. Peak velocity (S): 247 cm/s. Mean gradient (S): 12  mm Hg. Valve area (VTI): 1.24 cm^2. Valve area (Vmax): 1.18 cm^2.  Valve area (Vmean): 1.12 cm^2. Regurgitation pressure half-time:  508 ms.  - Mitral valve: Calcified annulus. There was mild regurgitation.  - Left atrium: The atrium was mildly to moderately dilated.  - Pulmonary arteries: PA peak  pressure: 38 mm Hg (S).  - Pericardium, extracardiac: There was a left pleural effusion.   Cardiac cath 01/2016  Prox RCA lesion, 75 %stenosed.  Ramus lesion, 95 %stenosed.  Prox LAD to Mid LAD lesion, 90 %stenosed.  Mid RCA to Dist RCA lesion, 100 %stenosed.  Dist RCA lesion, 100 %stenosed.  The left ventricular ejection fraction is 45-50% by visual estimate.  The left ventricular systolic function is normal.  LV end diastolic pressure is mildly elevated.   Acute coronary syndrome with recent total occlusion of the dominant RCA, high-grade obstruction in the proximal LAD which Collateralizes the RCA, and high-grade obstruction and a moderate size ramus intermedius branch.  Left ventricular systolic dysfunction with inferobasal moderate  hypokinesis and EF of 45-50%. Upper normal LV EDP.   RECOMMENDATIONS:  Long discussion with the patient concerning treatment options which include multivessel PCI / stenting vs coronary bypass grafting with LIMA to the LAD. If PCI, RCA will require near full metal jacket. After discussion, the patient would like the approach that will give her the most durable long-term success.  IV nitroglycerin, IV fluid, high intensity statin therapy, aspirin therapy, and beta blocker therapy as tolerated.  Expedited CABG because of presentation and critical nature of disease.  Labs/Other Tests and Data Reviewed:    EKG:  No ECG reviewed.  Recent Labs: 08/19/2019: B Natriuretic Peptide 264.9; Magnesium 2.1 10/08/2019: TSH 3.96 06/10/2020: ALT 29; BUN 14; Creat 0.76; Hemoglobin 13.3; Platelets 92; Potassium 3.9; Sodium 141   Recent Lipid Panel Lab Results  Component Value Date/Time   CHOL 161 02/03/2020 10:09 AM   CHOL 185 07/08/2016 10:45 AM   TRIG 77 02/03/2020 10:09 AM   HDL 76 02/03/2020 10:09 AM   HDL 55 07/08/2016 10:45 AM   CHOLHDL 2.1 02/03/2020 10:09 AM   LDLCALC 69 02/03/2020 10:09 AM    Wt Readings from Last 3 Encounters:  05/12/20  129 lb (58.5 kg)  04/10/20 130 lb (59 kg)  11/08/19 137 lb 4 oz (62.3 kg)     Objective:    Vital Signs:  LMP  (LMP Unknown)    VITAL SIGNS:  reviewed GEN:  no acute distress PSYCH:  normal affect  ASSESSMENT & PLAN:    1. Paroxysmal atrial fibrillation: -Initially found at an urgent care with EKG showing AF with RVR at which time she spontaneously converted to NSR -CHADSVASC =6(female, age, HTN, CAD, DM2, CHF) however not anticoagulated due to  -Echoperformed 08/20/19 with normal EF and G2DD -Continue Lopressor 12.5 mg changed to PRN given low BP and bradycardia  2. Hx ofCAD s/p CABGx 3: -Denies anginal symptoms  -Continue medical regimen   3. Chronic diastolic HF: -Echo with G2BW treated with Lasix 89m QD. She reporets that she had been halving her tablets to 23mQD and was doing well until she recently ran out about 2 weeks ago. We will send Lasix 2048mD to her pharmacy. Labs yesterday by PCP with normal Cr.   4. HLD: -Last LDL, 61 this admission  -Continue Zetia -No statin 2/2 to cirrhosis  5. HTN: -No BP available for the visit  -Continue current regimen         COVID-19 Education: The signs and symptoms of COVID-19 were discussed with the patient and how to seek care for testing (follow up with PCP or arrange E-visit). The importance of social distancing was discussed today.  Time:   Today, I have spent 20 minutes with the patient with telehealth technology discussing the above problems.     Medication Adjustments/Labs and Tests Ordered: Current medicines are reviewed at length with the patient today.  Concerns regarding medicines are outlined above.   Tests Ordered: No orders of the defined types were placed in this encounter.   Medication Changes: No orders of the defined types were placed in this encounter.   Follow Up:  In Person Dr. NisJohnsie Cancel 6 months   Signed, JilKathyrn DrownP  06/11/2020 10:42 AM    [place]

## 2020-06-11 ENCOUNTER — Telehealth (INDEPENDENT_AMBULATORY_CARE_PROVIDER_SITE_OTHER): Payer: Medicare Other | Admitting: Cardiology

## 2020-06-11 DIAGNOSIS — I509 Heart failure, unspecified: Secondary | ICD-10-CM

## 2020-06-11 DIAGNOSIS — I2510 Atherosclerotic heart disease of native coronary artery without angina pectoris: Secondary | ICD-10-CM | POA: Diagnosis not present

## 2020-06-11 DIAGNOSIS — I4891 Unspecified atrial fibrillation: Secondary | ICD-10-CM | POA: Diagnosis not present

## 2020-06-11 DIAGNOSIS — I81 Portal vein thrombosis: Secondary | ICD-10-CM

## 2020-06-11 DIAGNOSIS — Z951 Presence of aortocoronary bypass graft: Secondary | ICD-10-CM

## 2020-06-11 DIAGNOSIS — E785 Hyperlipidemia, unspecified: Secondary | ICD-10-CM

## 2020-06-11 DIAGNOSIS — I8500 Esophageal varices without bleeding: Secondary | ICD-10-CM | POA: Diagnosis not present

## 2020-06-11 LAB — CBC WITH DIFFERENTIAL/PLATELET
Absolute Monocytes: 502 cells/uL (ref 200–950)
Basophils Absolute: 42 cells/uL (ref 0–200)
Basophils Relative: 1.3 %
Eosinophils Absolute: 112 cells/uL (ref 15–500)
Eosinophils Relative: 3.5 %
HCT: 38 % (ref 35.0–45.0)
Hemoglobin: 13.3 g/dL (ref 11.7–15.5)
Lymphs Abs: 854 cells/uL (ref 850–3900)
MCH: 32 pg (ref 27.0–33.0)
MCHC: 35 g/dL (ref 32.0–36.0)
MCV: 91.3 fL (ref 80.0–100.0)
MPV: 12.7 fL — ABNORMAL HIGH (ref 7.5–12.5)
Monocytes Relative: 15.7 %
Neutro Abs: 1690 cells/uL (ref 1500–7800)
Neutrophils Relative %: 52.8 %
Platelets: 92 10*3/uL — ABNORMAL LOW (ref 140–400)
RBC: 4.16 10*6/uL (ref 3.80–5.10)
RDW: 13.6 % (ref 11.0–15.0)
Total Lymphocyte: 26.7 %
WBC: 3.2 10*3/uL — ABNORMAL LOW (ref 3.8–10.8)

## 2020-06-11 LAB — COMPLETE METABOLIC PANEL WITH GFR
AG Ratio: 1.1 (calc) (ref 1.0–2.5)
ALT: 29 U/L (ref 6–29)
AST: 42 U/L — ABNORMAL HIGH (ref 10–35)
Albumin: 3.3 g/dL — ABNORMAL LOW (ref 3.6–5.1)
Alkaline phosphatase (APISO): 101 U/L (ref 37–153)
BUN: 14 mg/dL (ref 7–25)
CO2: 27 mmol/L (ref 20–32)
Calcium: 9.4 mg/dL (ref 8.6–10.4)
Chloride: 107 mmol/L (ref 98–110)
Creat: 0.76 mg/dL (ref 0.60–0.93)
GFR, Est African American: 90 mL/min/{1.73_m2} (ref >=60)
GFR, Est Non African American: 78 mL/min/{1.73_m2} (ref >=60)
Globulin: 2.9 g/dL (calc) (ref 1.9–3.7)
Glucose, Bld: 112 mg/dL — ABNORMAL HIGH (ref 65–99)
Potassium: 3.9 mmol/L (ref 3.5–5.3)
Sodium: 141 mmol/L (ref 135–146)
Total Bilirubin: 1.3 mg/dL — ABNORMAL HIGH (ref 0.2–1.2)
Total Protein: 6.2 g/dL (ref 6.1–8.1)

## 2020-06-11 LAB — HEMOGLOBIN A1C
Hgb A1c MFr Bld: 9.4 % of total Hgb — ABNORMAL HIGH (ref <5.7)
Mean Plasma Glucose: 223 mg/dL
eAG (mmol/L): 12.4 mmol/L

## 2020-06-11 MED ORDER — FUROSEMIDE 20 MG PO TABS: 20.0000 mg | ORAL_TABLET | Freq: Every day | ORAL | 3 refills | Status: DC

## 2020-06-11 NOTE — Addendum Note (Signed)
Addended by: Jacinta Shoe on: 06/11/2020 11:28 AM   Modules accepted: Orders

## 2020-06-11 NOTE — Patient Instructions (Addendum)
Medication Instructions:  Your physician has recommended you make the following change in your medication:  1. DECREASE LASIX TO 20 MG DAILY.  *If you need a refill on your cardiac medications before your next appointment, please call your pharmacy*   Lab Work: TO BE DONE IN 6 MONTHS: LIPIDS If you have labs (blood work) drawn today and your tests are completely normal, you will receive your results only by: [name] Kitchen MyChart Message (if you have MyChart) OR . A paper copy in the mail If you have any lab test that is abnormal or we need to change your treatment, we will call you to review the results.   Testing/Procedures: NONE   Follow-Up: At [facility], you and your health needs are our priority.  As part of our continuing mission to provide you with exceptional heart care, we have created designated Provider Care Teams.  These Care Teams include your primary Cardiologist (physician) and Advanced Practice Providers (APPs -  Physician Assistants and Nurse Practitioners) who all work together to provide you with the care you need, when you need it.  We recommend signing up for the patient portal called "MyChart".  Sign up information is provided on this After Visit Summary.  MyChart is used to connect with patients for Virtual Visits (Telemedicine).  Patients are able to view lab/test results, encounter notes, upcoming appointments, etc.  Non-urgent messages can be sent to your provider as well.   To learn more about what you can do with MyChart, go to [email or web address].    Your next appointment:   6 month(s)  The format for your next appointment:   In Person  Provider:   You may see [name], MD or one of the following Advanced Practice Providers on your designated Care Team:    Kathyrn Drown, NP

## 2020-06-12 ENCOUNTER — Other Ambulatory Visit: Payer: Self-pay

## 2020-06-12 MED ORDER — XIFAXAN 550 MG PO TABS
550.0000 mg | ORAL_TABLET | Freq: Two times a day (BID) | ORAL | 3 refills | Status: DC
Start: 2020-06-12 — End: 2020-08-05

## 2020-06-12 NOTE — Progress Notes (Signed)
Her sugar average trended back up.  Platelets and liver tests are pretty stable.

## 2020-06-15 ENCOUNTER — Encounter: Payer: Self-pay | Admitting: Internal Medicine

## 2020-06-15 ENCOUNTER — Other Ambulatory Visit: Payer: Self-pay

## 2020-06-15 ENCOUNTER — Ambulatory Visit (INDEPENDENT_AMBULATORY_CARE_PROVIDER_SITE_OTHER): Payer: Medicare Other | Admitting: Internal Medicine

## 2020-06-15 VITALS — BP 118/62 | HR 65 | Temp 97.1°F | Ht 63.0 in | Wt 134.0 lb

## 2020-06-15 DIAGNOSIS — R188 Other ascites: Secondary | ICD-10-CM | POA: Diagnosis not present

## 2020-06-15 DIAGNOSIS — I5032 Chronic diastolic (congestive) heart failure: Secondary | ICD-10-CM

## 2020-06-15 DIAGNOSIS — E785 Hyperlipidemia, unspecified: Secondary | ICD-10-CM | POA: Diagnosis not present

## 2020-06-15 DIAGNOSIS — K7460 Unspecified cirrhosis of liver: Secondary | ICD-10-CM

## 2020-06-15 DIAGNOSIS — I2510 Atherosclerotic heart disease of native coronary artery without angina pectoris: Secondary | ICD-10-CM

## 2020-06-15 DIAGNOSIS — E1169 Type 2 diabetes mellitus with other specified complication: Secondary | ICD-10-CM | POA: Diagnosis not present

## 2020-06-15 DIAGNOSIS — E1165 Type 2 diabetes mellitus with hyperglycemia: Secondary | ICD-10-CM | POA: Diagnosis not present

## 2020-06-15 DIAGNOSIS — H9113 Presbycusis, bilateral: Secondary | ICD-10-CM | POA: Diagnosis not present

## 2020-06-15 DIAGNOSIS — I25118 Atherosclerotic heart disease of native coronary artery with other forms of angina pectoris: Secondary | ICD-10-CM

## 2020-06-15 DIAGNOSIS — Z794 Long term (current) use of insulin: Secondary | ICD-10-CM | POA: Diagnosis not present

## 2020-06-15 NOTE — Progress Notes (Signed)
Location:  [facility] clinic Provider:  Mitra Duling L. Mariea Clonts, D.O., C.M.D.  Code Status: DNR Goals of Care:  Advanced Directives 06/15/2020  Does Patient Have a Medical Advance Directive? Yes  Type of Advance Directive Living will;Healthcare Power of Attorney  Does patient want to make changes to medical advance directive? No - Patient declined  Copy of [place] in Chart? Yes - validated most recent copy scanned in chart (See row information)  Would patient like information on creating a medical advance directive? -     Chief Complaint  Patient presents with  . Medical Management of Chronic Issues    4 month follow up. Discuss swelling in legs and feet.   [name] Kitchen Health Maintenance    Discuss Covid 19, foot exam, eye exam and urine microalbumin    HPI: Patient is a 75 y.o. female seen today for medical management of chronic diseases.    She was without lasix for about a week b/c she had to have a cardiology appt before it oculd be filled.  Not sob.  She did have more fluid in her abdomen, but it's gone down.  Got her lasix 52m again Thursday.  Right ankle swells more.    She's over covid.    She also had her eye exam with Dr. DBing Plumeand another in spring.    Sugar has gone up for her.  CBG was 214 this am.  She's usually 100 or lower in the am.  She's been eating the same.  Back when her hba1c was 6.9 her children were really watching what she was eating.  She still does not eat butter.  She eats mostly chicken, rare pork chops.  Does not eat beef.  Has not gone completely off.  She made a gnocchi dish recently--they ate it two days.  She notes here hearing has been worse since covid.  She could at least hear something after her hearing aids were out, but now she hears nothing w/o them.  Says two of her friends are not hearing as well after covid either.    Past Medical History:  Diagnosis Date  . A-fib ([place]    08/19/19 afib with RVR, spontaneously converted to SR  . Allergy    . Arthritis    neck  . Cataract    bilateral - MD monitoring cataracts  . CHF (congestive heart failure) ([place]   . Chronic kidney disease, stage I    DR OTTELIN  HX UTIS  . Cirrhosis ([place]   . Coronary artery disease    s/p CABG 01/29/16: LIMA-LAD, SVG-RI, SVG-dRCA  . COVID-19 virus infection 04/2020  . Cramp of limb   . Diabetes mellitus   . Dysphagia, unspecified(787.20)   . Dysuria   . Epistaxis   . GERD (gastroesophageal reflux disease)   . Hearing loss    wears hearing aids  . Heart murmur     DX FOR YEARS ASYMPTOMATIC  . Hepatic encephalopathy ([place]   . Lumbago   . Myocardial infarction ([place]    NSTEMI 01/27/16  . Neoplasm of uncertain behavior of skin   . Nonspecific elevation of levels of transaminase or lactic acid dehydrogenase (LDH)   . Osteoarthrosis, unspecified whether generalized or localized, unspecified site   . Other and unspecified hyperlipidemia    diet controlled  . Pain in joint, shoulder region   . Paresthesias 04/01/2015  . Postablative ovarian failure   . Trochanteric bursitis of left hip 12/15/2015  . Type 2  diabetes mellitus without complication ([place])   . Unspecified essential hypertension    no meds  . Wears glasses     Past Surgical History:  Procedure Laterality Date  . BACK SURGERY  06/2015  . BREAST BIOPSY Left   . CARDIAC CATHETERIZATION N/A 01/27/2016   Procedure: Left Heart Cath and Coronary Angiography;  Surgeon: Belva Crome, MD;  Location: [place] [facility] LAB;  Service: Cardiovascular;  Laterality: N/A;  . COLONOSCOPY  2012   Dr Lajoyce Corners.   . COLONOSCOPY WITH PROPOFOL N/A 07/07/2016   Procedure: COLONOSCOPY WITH PROPOFOL;  Surgeon: [name], MD;  Location[facility];  Service: Endoscopy;  Laterality: N/A;  . CORONARY ARTERY BYPASS GRAFT N/A 01/28/2016   Procedure: CORONARY ARTERY BYPASS GRAFTING (CABG) x 3 USING RIGHT LEG GREATER SAPHENOUS VEIN GRAFT;  Surgeon: Melrose Nakayama, MD;  Location: [place];  Service: Open Heart  Surgery;  Laterality: N/A;  . ENDOVEIN HARVEST OF GREATER SAPHENOUS VEIN Right 01/28/2016   Procedure: ENDOVEIN HARVEST OF GREATER SAPHENOUS VEIN;  Surgeon: Melrose Nakayama, MD;  Location: [place];  Service: Open Heart Surgery;  Laterality: Right;  . ESOPHAGEAL BANDING  03/28/2019   Procedure: ESOPHAGEAL BANDING;  Surgeon: [name], MD;  [facility];  Service: Endoscopy;;  . ESOPHAGEAL BANDING  04/07/2019   Procedure: ESOPHAGEAL BANDING;  Surgeon: Juanita Craver, MD;  Location: [facility] ENDOSCOPY;  Service: Endoscopy;;  . ESOPHAGOGASTRODUODENOSCOPY N/A 04/07/2019   Procedure: ESOPHAGOGASTRODUODENOSCOPY (EGD);  Surgeon: Juanita Craver, MD;  Location: [facility] ENDOSCOPY;  Service: Endoscopy;  Laterality: N/A;  . ESOPHAGOGASTRODUODENOSCOPY (EGD) WITH PROPOFOL N/A 07/07/2016   Procedure: ESOPHAGOGASTRODUODENOSCOPY (EGD) WITH PROPOFOL;  Surgeon: [name], MD;  [facility];  Service: Endoscopy;  Laterality: N/A;  . ESOPHAGOGASTRODUODENOSCOPY (EGD) WITH PROPOFOL N/A 03/28/2019   Procedure: ESOPHAGOGASTRODUODENOSCOPY (EGD) WITH PROPOFOL;  Surgeon: [name], MD;  [facility];  Service: Endoscopy;  Laterality: N/A;  . HEMOSTASIS CLIP PLACEMENT  04/07/2019   Procedure: HEMOSTASIS CLIP PLACEMENT;  Surgeon: Juanita Craver, MD;  Location: [place] ENDOSCOPY;  Service: Endoscopy;;  . IR ANGIOGRAM SELECTIVE EACH ADDITIONAL VESSEL  04/08/2019  . IR EMBO ART  VEN HEMORR LYMPH EXTRAV  INC GUIDE ROADMAPPING  04/08/2019  . IR PARACENTESIS  04/08/2019  . IR RADIOLOGIST EVAL & MGMT  06/13/2019  . IR RADIOLOGIST EVAL & MGMT  09/25/2019  . IR TIPS  04/08/2019  . MAXIMUM ACCESS (MAS)POSTERIOR LUMBAR INTERBODY FUSION (PLIF) 1 LEVEL Left 06/10/2015   Procedure: FOR MAXIMUM ACCESS (MAS) POSTERIOR LUMBAR INTERBODY FUSION (PLIF) LUMBAR THREE-FOUR EXTRAFORAMINAL MICRODISCECTOMY LUMBAR FIVE-SACRAL ONE LEFT;  Surgeon: Eustace Moore, MD;  Location: [place] NEURO ORS;  Service: Neurosurgery;  Laterality:  Left;  . RADIOLOGY WITH ANESTHESIA N/A 04/08/2019   Procedure: RADIOLOGY WITH ANESTHESIA;  Surgeon: Radiologist, Medication, MD;  Location: [place];  Service: Radiology;  Laterality: N/A;  . SCLEROTHERAPY  04/07/2019   Procedure: SCLEROTHERAPY;  Surgeon: Juanita Craver, MD;  Location: [facility] ENDOSCOPY;  Service: Endoscopy;;  . TEE WITHOUT CARDIOVERSION N/A 01/28/2016   Procedure: TRANSESOPHAGEAL ECHOCARDIOGRAM (TEE);  Surgeon: Melrose Nakayama, MD;  Location: [place];  Service: Open Heart Surgery;  Laterality: N/A;  . TUBAL LIGATION  1982   Dr Connye Burkitt  . UMBILICAL HERNIA REPAIR N/A 09/10/2019   Procedure: HERNIA REPAIR UMBILICAL ADULT;  Surgeon: Donnie Mesa, MD;  Location: [place];  Service: General;  Laterality: N/A;  . UPPER GASTROINTESTINAL ENDOSCOPY    . VAGINAL HYSTERECTOMY  1997   Dr Rande Lawman  Allergies  Allergen Reactions  . Kiwi Extract Anaphylaxis  . Tdap [Tetanus-Diphth-Acell Pertussis] Swelling and Other (See Comments)    Swelling at injection site, gets very hot  . Statins Other (See Comments)    RHABDOMYOLYSIS  . Latex Itching, Dermatitis and Rash  . Tramadol Nausea And Vomiting    Outpatient Encounter Medications as of 06/15/2020  Medication Sig  . acetaminophen (TYLENOL) 500 MG tablet Take 500 mg by mouth at bedtime.  . benzonatate (TESSALON) 100 MG capsule Take 1 capsule (100 mg total) by mouth every 8 (eight) hours.  . Biotin 10000 MCG TABS Take 10,000 mcg by mouth every morning.  . colchicine 0.6 MG tablet Take 1 tablet (0.6 mg total) by mouth daily. Discontinue after 3 more months  . Cyanocobalamin (VITAMIN B 12 PO) Take 1,000 mcg by mouth daily.  [name] Kitchen ezetimibe (ZETIA) 10 MG tablet TAKE 1 TABLET BY MOUTH EVERY DAY  . furosemide (LASIX) 20 MG tablet Take 1 tablet (20 mg total) by mouth daily.  [name] Kitchen glucose blood test strip One Touch Ultra II strips. Use to test blood sugar three times daily. Dx: E11.65  . Insulin Pen Needle (BD PEN NEEDLE NANO U/F) 32G X 4 MM MISC USE THREE  TIMES DAILY AS DIRECTED  . Insulin Syringe-Needle U-100 (INSULIN SYRINGE 1CC/31GX5/16") 31G X 5/16" 1 ML MISC USE AS DIRECTED DAILY WITH LEVEMIR. Dx: E11.65  . JARDIANCE 25 MG TABS tablet TAKE 1 TABLET BY MOUTH DAILY  . lactulose (CHRONULAC) 10 GM/15ML solution TAKE 45 ML BY MOUTH THREE TIMES DAILY AS DIRECTED  . LEVEMIR FLEXTOUCH 100 UNIT/ML FlexPen ADMINISTER 30 UNITS UNDER THE SKIN AT BEDTIME  . loratadine (CLARITIN) 10 MG tablet Take 10 mg by mouth daily as needed for allergies.  [name] Kitchen MAGNESIUM PO Take 500 mg by mouth daily.   . metoprolol tartrate (LOPRESSOR) 25 MG tablet Take 1 tablet (25 mg total) by mouth as needed (use as needed for HR greater than 110).  . Multiple Vitamins-Minerals (MULTIVITAMIN WITH MINERALS) tablet Take 1 tablet by mouth daily.  . pantoprazole (PROTONIX) 40 MG tablet TAKE 1 TABLET(40 MG) BY MOUTH DAILY  . Polyethyl Glycol-Propyl Glycol (SYSTANE OP) Place 1 drop into both eyes daily.   . Probiotic Product (PROBIOTIC DAILY PO) Take 1 capsule by mouth daily. Digestive Advantage Probiotic  . XIFAXAN 550 MG TABS tablet Take 1 tablet (550 mg total) by mouth 2 (two) times daily.   No facility-administered encounter medications on file as of 06/15/2020.    Review of Systems:  Review of Systems  Constitutional: Negative for chills, fever and malaise/fatigue.  HENT: Negative for congestion and sore throat.   Eyes: Negative for blurred vision.  Respiratory: Negative for cough and shortness of breath.   Cardiovascular: Positive for leg swelling. Negative for chest pain and palpitations.  Gastrointestinal: Negative for abdominal pain, blood in stool, constipation and melena.  Genitourinary: Negative for dysuria.  Musculoskeletal: Negative for falls and joint pain.  Skin: Negative for itching and rash.  Neurological: Negative for dizziness and loss of consciousness.  Endo/Heme/Allergies: Bruises/bleeds easily.  Psychiatric/Behavioral: The patient does not have insomnia.      Health Maintenance  Topic Date Due  . COVID-19 Vaccine (1) Never done  . OPHTHALMOLOGY EXAM  08/24/2018  . FOOT EXAM  10/04/2019  . URINE MICROALBUMIN  01/31/2020  . HEMOGLOBIN A1C  12/08/2020  . COLONOSCOPY (Pts 45-77yr Insurance coverage will need to be confirmed)  07/07/2021  . MAMMOGRAM  11/21/2021  . INFLUENZA VACCINE  Completed  . DEXA SCAN  Completed  . Hepatitis C Screening  Completed  . PNA vac Low Risk Adult  Completed  . TETANUS/TDAP  Discontinued    Physical Exam: Vitals:   06/15/20 1041  BP: 118/62  Pulse: 65  Temp: (!) 97.1 F (36.2 C)  Weight: 134 lb (60.8 kg)  Height: 5' 3"  (1.6 m)   Body mass index is 23.74 kg/m. Physical Exam Vitals reviewed.  Constitutional:      General: She is not in acute distress.    Appearance: Normal appearance. She is not toxic-appearing.  HENT:     Head: Normocephalic and atraumatic.  Eyes:     Conjunctiva/sclera: Conjunctivae normal.     Pupils: Pupils are equal, round, and reactive to light.  Cardiovascular:     Rate and Rhythm: Rhythm irregular.     Heart sounds: No murmur heard.   Pulmonary:     Effort: Pulmonary effort is normal.     Breath sounds: Normal breath sounds. No rales.  Abdominal:     General: Bowel sounds are normal.     Palpations: Abdomen is soft.     Tenderness: There is no abdominal tenderness. There is no guarding or rebound.  Musculoskeletal:        General: Normal range of motion.     Comments: Right 2+ edema especially lateral malleolus area, 1+ left  Skin:    General: Skin is warm and dry.  Neurological:     General: No focal deficit present.     Mental Status: She is alert and oriented to person, place, and time.     Motor: No weakness.     Gait: Gait normal.  Psychiatric:        Mood and Affect: Mood normal.     Labs reviewed: Basic Metabolic Panel: Recent Labs    08/19/19 1530 08/19/19 1731 08/20/19 0307 10/08/19 0917 11/08/19 1030 04/10/20 1145 05/12/20 0948  06/10/20 0826  NA  --   --    < > 140   < > 137 139 141  K  --   --    < > 3.6   < > 3.9 3.5 3.9  CL  --   --    < > 107   < > 103 104 107  CO2  --   --    < > 27   < > 23 26 27   GLUCOSE  --   --    < > 137*   < > 131* 220* 112*  BUN  --   --    < > 18   < > 19 15 14   CREATININE  --   --    < > 0.91   < > 0.77 0.80 0.76  CALCIUM  --   --    < > 9.3   < > 9.3 9.5 9.4  MG  --  2.1  --   --   --   --   --   --   TSH 3.704  --   --  3.96  --   --   --   --    < > = values in this interval not displayed.   Liver Function Tests: Recent Labs    09/10/19 0844 10/08/19 0917 11/08/19 1030 02/03/20 1009 05/12/20 0948 06/10/20 0826  AST 79*   < > 55* 46* 47* 42*  ALT 51*   < > 36* 35* 35 29  ALKPHOS 141*  --  149*  --  98  --   BILITOT 1.7*   < > 1.8* 1.5* 1.6* 1.3*  PROT 6.3*   < > 6.9 6.3 6.8 6.2  ALBUMIN 2.6*  --  3.4*  --  3.4*  --    < > = values in this interval not displayed.   No results for input(s): LIPASE, AMYLASE in the last 8760 hours. No results for input(s): AMMONIA in the last 8760 hours. CBC: Recent Labs    02/03/20 1009 04/10/20 1145 05/12/20 0948 06/10/20 0826  WBC 3.6* 4.2 3.9* 3.2*  NEUTROABS 2,045 2.7  --  1,690  HGB 13.1 13.3 13.5 13.3  HCT 38.8 41.3 40.7 38.0  MCV 89.6 95.8 93.0 91.3  PLT 102* 70* 98.0* 92*   Lipid Panel: Recent Labs    08/20/19 0307 10/08/19 0917 02/03/20 1009  CHOL 128 150 161  HDL 58 68 76  LDLCALC 61 66 69  TRIG 44 76 77  CHOLHDL 2.2 2.2 2.1   Lab Results  Component Value Date   HGBA1C 9.4 (H) 06/10/2020    Procedures since last visit: No results found.  Assessment/Plan 1. Uncontrolled type 2 diabetes mellitus with hyperglycemia, with long-term current use of insulin ([place]) - encouraged her to get back on track with her diet like when her family was helping b/c her sugar was so much better and it's gotten worse again - Microalbumin / creatinine urine ratio - Hemoglobin A1c; Future - COMPLETE METABOLIC PANEL WITH  GFR; Future - CBC with Differential/Platelet; Future  2. Cirrhosis of liver with ascites, unspecified hepatic cirrhosis type ([place]) - she reports she did have a slight increase in ascites when without her lasix, but since getting a new supply, it is going down (had needed cardio appt) - COMPLETE METABOLIC PANEL WITH GFR; Future  3. Hyperlipidemia associated with type 2 diabetes mellitus ([place]) -being monitored at cardiology -SHE HAD SEVERE STATIN MYOPATHY   4. Coronary artery disease of native artery of native heart with stable angina pectoris (H[facility]) -cont bp, lipid, glucose control, no related symptoms at present - COMPLETE METABOLIC PANEL WITH GFR; Future  5. Chronic diastolic congestive heart failure ([place]) -recommended she take two lasix for three days to get the extra fluid off gained when she was w/o it for a week (leg swelling) - COMPLETE METABOLIC PANEL WITH GFR; Future  6. Presbycusis of both ears -keep planned audiology visit--later in day called back needing referral to [facility] and it was placed.  Labs/tests ordered:   Orders Placed This Encounter  Procedures  . Microalbumin / creatinine urine ratio  . Hemoglobin A1c    Standing Status:   Future    Standing Expiration Date:   03/15/2021    Order Specific Question:   Release to patient    Answer:   Immediate  . COMPLETE METABOLIC PANEL WITH GFR    Standing Status:   Future    Standing Expiration Date:   03/15/2021    Order Specific Question:   Release to patient    Answer:   Immediate  . CBC with Differential/Platelet    Standing Status:   Future    Standing Expiration Date:   03/15/2021    Order Specific Question:   Release to patient    Answer:   Immediate   Next appt:  4 mos med mgt fasting labs before   Erinne Gillentine L. Tarron Krolak, D.O. [facility] [address], [place] [address] Cell Phone (  Mon-Fri 8am-5pm):  [phone] On Call:  [phone] & follow prompts after 5pm &  weekends Office Phone:  [phone] Office Fax:  [phone]

## 2020-06-15 NOTE — Patient Instructions (Addendum)
I recommend you take lasix 58m for 2 days to help with your edema, then go back to the 257mdaily.    Try to get back to your healthier diet a bit like you had been on when your children were involved more.

## 2020-06-16 ENCOUNTER — Other Ambulatory Visit: Payer: Self-pay | Admitting: *Deleted

## 2020-06-16 ENCOUNTER — Telehealth: Payer: Self-pay | Admitting: Internal Medicine

## 2020-06-16 DIAGNOSIS — H9113 Presbycusis, bilateral: Secondary | ICD-10-CM

## 2020-06-16 LAB — MICROALBUMIN / CREATININE URINE RATIO
Creatinine, Urine: 73 mg/dL (ref 20–275)
Microalb Creat Ratio: 15 mcg/mg creat (ref <30)
Microalb, Ur: 1.1 mg/dL

## 2020-06-16 NOTE — Patient Outreach (Signed)
[place] [facility]) Care Management  06/16/2020  Jerzee Jerome 1946-04-07 [id number]   Outgoing call placed to son [name], no answer, HIPAA compliant voice message left.  Will follow up within the next 3-4 business days.  [name], [place], MSN [place] [phone]

## 2020-06-16 NOTE — Telephone Encounter (Signed)
Referral entered  

## 2020-06-16 NOTE — Telephone Encounter (Signed)
Levada Dy with [facility] called to request referral for pt for hearing/audio eval.  Thanks, Vilinda Blanks

## 2020-06-16 NOTE — Progress Notes (Signed)
No significant protein in the urine

## 2020-06-18 DIAGNOSIS — Z822 Family history of deafness and hearing loss: Secondary | ICD-10-CM | POA: Diagnosis not present

## 2020-06-18 DIAGNOSIS — H903 Sensorineural hearing loss, bilateral: Secondary | ICD-10-CM | POA: Diagnosis not present

## 2020-06-19 ENCOUNTER — Other Ambulatory Visit: Payer: Self-pay | Admitting: Interventional Radiology

## 2020-06-19 ENCOUNTER — Other Ambulatory Visit: Payer: Self-pay | Admitting: *Deleted

## 2020-06-19 DIAGNOSIS — Z95828 Presence of other vascular implants and grafts: Secondary | ICD-10-CM

## 2020-06-19 NOTE — Patient Outreach (Signed)
[place] [name]) Care Management  06/19/2020  Yuleidy Rappleye Dec 09, 1945 [id number]   Outreach attempt #2 to member/son, unsuccessful, HIPAA compliant voice message left.  Will send outreach letter and follow up within the next 3-4 business days.  [name], [place], MSN [place] [phone]

## 2020-06-20 ENCOUNTER — Other Ambulatory Visit: Payer: Self-pay | Admitting: Internal Medicine

## 2020-06-20 DIAGNOSIS — E785 Hyperlipidemia, unspecified: Secondary | ICD-10-CM

## 2020-06-20 DIAGNOSIS — E1169 Type 2 diabetes mellitus with other specified complication: Secondary | ICD-10-CM

## 2020-06-24 ENCOUNTER — Other Ambulatory Visit: Payer: Self-pay | Admitting: *Deleted

## 2020-06-24 NOTE — Patient Outreach (Signed)
[place] [facility]) Care Management  06/24/2020  Tashari Schoenfelder 1945-08-16 [id number]   Outreach attempt #3, successful to son.  He report member's blood sugars have increased a little, state she has fallen back into old ways once they (her children) "turned her loose" to be able to manage her health on her own.  Was seen by PCP on 2/7, A1C slightly increased at list check from 9.2 to 9.4.  They are still working on educating and encouraging her of the importance of adherence to diabetic plan of care.  She will follow up with PCP again in June.  Denies any urgent concerns, encouraged to contact this care manager with questions.  Agrees to follow up within the next month.  Goals Addressed            This Visit's Progress   . [facility] - Monitor and Manage My Blood Sugar   On track    Follow Up Date 07/22/2020   Timeframe:  Short-Term Goal Priority:  High Start Date:      06/24/2020                       Expected End Date:    07/22/2020                   - check blood sugar at prescribed times - check blood sugar if I feel it is too high or too low - take the blood sugar log to all doctor visits    Why is this important?   Checking your blood sugar at home helps to keep it from getting very high or very low.  Writing the results in a diary or log helps the doctor know how to care for you.  Your blood sugar log should have the time, date and the results.  Also, write down the amount of insulin or other medicine that you take.  Other information, like what you ate, exercise done and how you were feeling, will also be helpful.     Notes:   12/9 - Reminded of proper diabetes diet, reviewed medications for glucose control  05/14/2020 - Advised son to continue to educate member regarding proper food choices  2/16 - Educated son on importance of encouraging self monitoring and medication management    . [facility] - Set My Target A1C   On track    Follow Up Date 07/22/2020    Timeframe:  Long-Range Goal Priority:  Medium Start Date:        06/24/2020                     Expected End Date:     09/21/2020                 - set target A1C - less than 7    Why is this important?   Your target A1C is decided together by you and your doctor.  It is based on several things like your age and other health issues.    Notes:   12/9 - Reviewed upcoming appointment with PCP and fasting labs, preparation for new A1C  05/14/2020 - Reminded of labs on 2/2 and PCP visit on 2/7  2/16 - Son reminded of importance of diabetic diet      [name], Therapist, sports, MSN [place] Manager [phone]

## 2020-06-29 ENCOUNTER — Encounter: Payer: Self-pay | Admitting: Internal Medicine

## 2020-07-07 ENCOUNTER — Ambulatory Visit
Admission: RE | Admit: 2020-07-07 | Discharge: 2020-07-07 | Disposition: A | Discharge status: Home / Self Care | Payer: Medicare Other | Source: Ambulatory Visit | Attending: Interventional Radiology | Admitting: Interventional Radiology

## 2020-07-07 ENCOUNTER — Encounter: Payer: Self-pay | Admitting: *Deleted

## 2020-07-07 DIAGNOSIS — Z95828 Presence of other vascular implants and grafts: Secondary | ICD-10-CM

## 2020-07-07 DIAGNOSIS — I8501 Esophageal varices with bleeding: Secondary | ICD-10-CM | POA: Diagnosis not present

## 2020-07-07 DIAGNOSIS — K7581 Nonalcoholic steatohepatitis (NASH): Secondary | ICD-10-CM | POA: Diagnosis not present

## 2020-07-07 DIAGNOSIS — K661 Hemoperitoneum: Secondary | ICD-10-CM | POA: Diagnosis not present

## 2020-07-07 HISTORY — PX: IR RADIOLOGIST EVAL & MGMT: IMG5224

## 2020-07-16 ENCOUNTER — Telehealth: Payer: Self-pay

## 2020-07-16 NOTE — Telephone Encounter (Signed)
I called and spoke with patient and informed her that her insulin had come and she could pick it up at anytime she said she would send her husband [name] to pick it up today

## 2020-07-22 ENCOUNTER — Other Ambulatory Visit: Payer: Self-pay | Admitting: *Deleted

## 2020-07-22 NOTE — Patient Outreach (Signed)
[place] [facility]) Care Management  07/22/2020  Jaclyne Haverstick [birth year] [id number]   Outgoing call placed to member, successful but call was disconnected once this care manager introduced self.  Call then placed to son [name], state member has been getting a lot of scam calls and is skeptical about all calls.  He report she has been doing well with the exception of her blood sugars.  He and his family are still working to help member had member's husband manage conditions.  Denies any urgent concerns, encouraged to contact this care manager with questions.  Agrees to follow up within the next month.  Goals Addressed            This Visit's Progress   . [facility] - Monitor and Manage My Blood Sugar   On track    Follow Up Date 07/22/2020   Timeframe:  Short-Term Goal Priority:  High Start Date:      06/24/2020                       Expected End Date:    07/22/2020                   - check blood sugar at prescribed times - check blood sugar if I feel it is too high or too low - take the blood sugar log to all doctor visits    Why is this important?   Checking your blood sugar at home helps to keep it from getting very high or very low.  Writing the results in a diary or log helps the doctor know how to care for you.  Your blood sugar log should have the time, date and the results.  Also, write down the amount of insulin or other medicine that you take.  Other information, like what you ate, exercise done and how you were feeling, will also be helpful.     Notes:   12/9 - Reminded of proper diabetes diet, reviewed medications for glucose control  05/14/2020 - Advised son to continue to educate member regarding proper food choices  2/16 - Educated son on importance of encouraging self monitoring and medication management    . [facility] - Set My Target A1C   On track    Follow Up Date 07/22/2020   Timeframe:  Long-Range Goal Priority:  Medium Start Date:        06/24/2020                      Expected End Date:     09/21/2020                 - set target A1C - less than 7    Why is this important?   Your target A1C is decided together by you and your doctor.  It is based on several things like your age and other health issues.    Notes:   12/9 - Reviewed upcoming appointment with PCP and fasting labs, preparation for new A1C  05/14/2020 - Reminded of labs on 2/2 and PCP visit on 2/7  2/16 - Son reminded of importance of diabetic diet      [name], Therapist, sports, MSN [place] Manager [phone]

## 2020-07-29 ENCOUNTER — Other Ambulatory Visit: Payer: Self-pay | Admitting: Internal Medicine

## 2020-07-29 NOTE — Progress Notes (Signed)
Patient ID: [name], female   DOB: [birth year], 74 y.o.   MRN: [id number]       Chief Complaint: Patient was seen in consultation today for follow-up TIPS at the request of Drue Harr  Referring Physician(s): Goddess Gebbia  History of Present Illness: [name] is a 75 y.o. female with a history of NASH cirrhosis   03/28/2019 EGD with esophageal banding x14, subsequently persistent  retrosternal pain   04/06/2019 CT demonstrates portal venous thrombus, possible splenic infarct.  No evidence of liver mass. 04/07/2019 EGD demonstrating esophageal bleeding requiring epinephrine injection and clipping.  Persistent bleeding per rectum. 04/08/2019 TIPS creation with decrease in mean portosystemic gradient from 15 to 4 mmHg, coil embolization of esophageal varices, paracentesis removing 1.5 L   04/12/2019 discharged home, no further bleeding no encephalopathy. 05/15/2019 to ED with worsening encephalopathy, started on maximum lactulose, rifaximin 05/26/2019 to ED with acute metabolic encephalopathy secondary to hypercalcemia, ammonia level stable 05/29/2019 US liver Doppler shows patency of TIPS shunt, concern of right lobe mass 3.8 cm, new since previous imaging from November 05/30/2019 MR liver protocol shows 3 cm mixed signal right lobe mass lateral to the mid portion of the TIPS, indeterminate but biloma favored, some motion degradation on the study  09/06/2019 follow-up MRI shows decrease in size of hematoma 1.7 cm, no new lesion or suspicious hepatic lesion   Patient is doing fairly well at home.  She had TIPS ultrasound today. She denies any confusion, sleepiness, or encephalopathy. No abdominal pain.  Appetite good.  No further GI bleeding.  No abdominal distention.    Past Medical History:  Diagnosis Date  . A-fib ([place])    08/19/19 afib with RVR, spontaneously converted to SR  . Allergy   . Arthritis    neck  . Cataract    bilateral - MD monitoring  cataracts  . CHF (congestive heart failure) ([place])   . Chronic kidney disease, stage I    DR OTTELIN  HX UTIS  . Cirrhosis ([place])   . Coronary artery disease    s/p CABG 01/29/16: LIMA-LAD, SVG-RI, SVG-dRCA  . COVID-19 virus infection 04/2020  . Cramp of limb   . Diabetes mellitus   . Dysphagia, unspecified(787.20)   . Dysuria   . Epistaxis   . GERD (gastroesophageal reflux disease)   . Hearing loss    wears hearing aids  . Heart murmur     DX FOR YEARS ASYMPTOMATIC  . Hepatic encephalopathy ([place])   . Lumbago   . Myocardial infarction ([place])    NSTEMI 01/27/16  . Neoplasm of uncertain behavior of skin   . Nonspecific elevation of levels of transaminase or lactic acid dehydrogenase (LDH)   . Osteoarthrosis, unspecified whether generalized or localized, unspecified site   . Other and unspecified hyperlipidemia    diet controlled  . Pain in joint, shoulder region   . Paresthesias 04/01/2015  . Postablative ovarian failure   . Trochanteric bursitis of left hip 12/15/2015  . Type 2 diabetes mellitus without complication ([place])   . Unspecified essential hypertension    no meds  . Wears glasses     Past Surgical History:  Procedure Laterality Date  . BACK SURGERY  06/2015  . BREAST BIOPSY Left   . CARDIAC CATHETERIZATION N/A 01/27/2016   Procedure: Left Heart Cath and Coronary Angiography;  Surgeon: Belva Crome, MD;  Location: [facility];  Service: Cardiovascular;  Laterality: N/A;  . COLONOSCOPY  2012   Dr Lajoyce Corners.   [name] Kitchen  COLONOSCOPY WITH PROPOFOL N/A 07/07/2016   Procedure: COLONOSCOPY WITH PROPOFOL;  Surgeon: [name], MD;  Location: [facility];  Service: Endoscopy;  Laterality: N/A;  . CORONARY ARTERY BYPASS GRAFT N/A 01/28/2016   Procedure: CORONARY ARTERY BYPASS GRAFTING (CABG) x 3 USING RIGHT LEG GREATER SAPHENOUS VEIN GRAFT;  Surgeon: Melrose Nakayama, MD;  Location: [facility];  Service: Open Heart Surgery;  Laterality: N/A;  . ENDOVEIN HARVEST OF GREATER SAPHENOUS  VEIN Right 01/28/2016   Procedure: ENDOVEIN HARVEST OF GREATER SAPHENOUS VEIN;  Surgeon: Melrose Nakayama, MD;  Location: [facility];  Service: Open Heart Surgery;  Laterality: Right;  . ESOPHAGEAL BANDING  03/28/2019   Procedure: ESOPHAGEAL BANDING;  Surgeon: [name], MD;  Location: [facility];  Service: Endoscopy;;  . ESOPHAGEAL BANDING  04/07/2019   Procedure: ESOPHAGEAL BANDING;  Surgeon: Juanita Craver, MD;  Location: [facility] ENDOSCOPY;  Service: Endoscopy;;  . ESOPHAGOGASTRODUODENOSCOPY N/A 04/07/2019   Procedure: ESOPHAGOGASTRODUODENOSCOPY (EGD);  Surgeon: Juanita Craver, MD;  Location: [facility] ENDOSCOPY;  Service: Endoscopy;  Laterality: N/A;  . ESOPHAGOGASTRODUODENOSCOPY (EGD) WITH PROPOFOL N/A 07/07/2016   Procedure: ESOPHAGOGASTRODUODENOSCOPY (EGD) WITH PROPOFOL;  Surgeon: [name], MD;  Location: [facility];  Service: Endoscopy;  Laterality: N/A;  . ESOPHAGOGASTRODUODENOSCOPY (EGD) WITH PROPOFOL N/A 03/28/2019   Procedure: ESOPHAGOGASTRODUODENOSCOPY (EGD) WITH PROPOFOL;  Surgeon: [name], MD;  Location: [facility];  Service: Endoscopy;  Laterality: N/A;  . HEMOSTASIS CLIP PLACEMENT  04/07/2019   Procedure: HEMOSTASIS CLIP PLACEMENT;  Surgeon: Juanita Craver, MD;  Location: [facility];  Service: Endoscopy;;  . IR ANGIOGRAM SELECTIVE EACH ADDITIONAL VESSEL  04/08/2019  . IR EMBO ART  VEN HEMORR LYMPH EXTRAV  INC GUIDE ROADMAPPING  04/08/2019  . IR PARACENTESIS  04/08/2019  . IR RADIOLOGIST EVAL & MGMT  06/13/2019  . IR RADIOLOGIST EVAL & MGMT  09/25/2019  . IR RADIOLOGIST EVAL & MGMT  07/07/2020  . IR TIPS  04/08/2019  . MAXIMUM ACCESS (MAS)POSTERIOR LUMBAR INTERBODY FUSION (PLIF) 1 LEVEL Left 06/10/2015   Procedure: FOR MAXIMUM ACCESS (MAS) POSTERIOR LUMBAR INTERBODY FUSION (PLIF) LUMBAR THREE-FOUR EXTRAFORAMINAL MICRODISCECTOMY LUMBAR FIVE-SACRAL ONE LEFT;  Surgeon: Eustace Moore, MD;  Location: [facility];  Service: Neurosurgery;  Laterality: Left;  . RADIOLOGY WITH  ANESTHESIA N/A 04/08/2019   Procedure: RADIOLOGY WITH ANESTHESIA;  Surgeon: Radiologist, Medication, MD;  Location: [facility];  Service: Radiology;  Laterality: N/A;  . SCLEROTHERAPY  04/07/2019   Procedure: SCLEROTHERAPY;  Surgeon: Juanita Craver, MD;  Location[facility] ENDOSCOPY;  Service: Endoscopy;;  . TEE WITHOUT CARDIOVERSION N/A 01/28/2016   Procedure: TRANSESOPHAGEAL ECHOCARDIOGRAM (TEE);  Surgeon: Melrose Nakayama, MD;  Location: [facility];  Service: Open Heart Surgery;  Laterality: N/A;  . TUBAL LIGATION  1982   Dr Connye Burkitt  . UMBILICAL HERNIA REPAIR N/A 09/10/2019   Procedure: HERNIA REPAIR UMBILICAL ADULT;  Surgeon: Donnie Mesa, MD;  Location: [facility];  Service: General;  Laterality: N/A;  . UPPER GASTROINTESTINAL ENDOSCOPY    . VAGINAL HYSTERECTOMY  1997   Dr Rande Lawman    Allergies: Kiwi extract, Tdap [tetanus-diphth-acell pertussis], Statins, Latex, and Tramadol  Medications: Prior to Admission medications   Medication Sig Start Date End Date Taking? Authorizing Provider  acetaminophen (TYLENOL) 500 MG tablet Take 500 mg by mouth at bedtime.    [provider]  benzonatate (TESSALON) 100 MG capsule Take 1 capsule (100 mg total) by mouth every 8 (eight) hours. 04/10/20   Loni Beckwith, PA-C  Biotin 10000 MCG TABS Take 10,000  mcg by mouth every morning.    [provider]  colchicine 0.6 MG tablet Take 1 tablet (0.6 mg total) by mouth daily. Discontinue after 3 more months 10/25/19   Reed, Tiffany L, DO  Cyanocobalamin (VITAMIN B 12 PO) Take 1,000 mcg by mouth daily.    [provider]  ezetimibe (ZETIA) 10 MG tablet TAKE 1 TABLET BY MOUTH EVERY DAY 01/10/20   Reed, Tiffany L, DO  furosemide (LASIX) 20 MG tablet Take 1 tablet (20 mg total) by mouth daily. 06/11/20 09/09/20  Kathyrn Drown D, NP  glucose blood test strip One Touch Ultra II strips. Use to test blood sugar three times daily. Dx: E11.65 10/14/19   Reed, Tiffany L, DO  Insulin Pen Needle (BD PEN NEEDLE NANO  U/F) 32G X 4 MM MISC USE THREE TIMES DAILY AS DIRECTED 10/14/19   Reed, Tiffany L, DO  Insulin Syringe-Needle U-100 (INSULIN SYRINGE 1CC/31GX5/16") 31G X 5/16" 1 ML MISC USE AS DIRECTED DAILY WITH LEVEMIR. Dx: E11.65 07/29/19   Reed, Tiffany L, DO  JARDIANCE 25 MG TABS tablet TAKE 1 TABLET BY MOUTH DAILY 12/24/19   Reed, Tiffany L, DO  lactulose (CHRONULAC) 10 GM/15ML solution TAKE 45 ML BY MOUTH THREE TIMES DAILY AS DIRECTED 01/20/20   [name], MD  LEVEMIR FLEXTOUCH 100 UNIT/ML FlexPen ADMINISTER 30 UNITS UNDER THE SKIN AT BEDTIME 03/25/20   Reed, Tiffany L, DO  loratadine (CLARITIN) 10 MG tablet Take 10 mg by mouth daily as needed for allergies.    [provider]  MAGNESIUM PO Take 500 mg by mouth daily.     [provider]  metoprolol tartrate (LOPRESSOR) 25 MG tablet Take 1 tablet (25 mg total) by mouth as needed (use as needed for HR greater than 110). 08/21/19   Tommie Raymond, NP  Multiple Vitamins-Minerals (MULTIVITAMIN WITH MINERALS) tablet Take 1 tablet by mouth daily.    [provider]  pantoprazole (PROTONIX) 40 MG tablet TAKE 1 TABLET(40 MG) BY MOUTH DAILY 05/21/20   Reed, Tiffany L, DO  Polyethyl Glycol-Propyl Glycol (SYSTANE OP) Place 1 drop into both eyes daily.     [provider]  Probiotic Product (PROBIOTIC DAILY PO) Take 1 capsule by mouth daily. Digestive Advantage Probiotic    [provider]  XIFAXAN 550 MG TABS tablet Take 1 tablet (550 mg total) by mouth 2 (two) times daily. 06/12/20   [name], MD     Family History  Problem Relation Age of Onset  . Lung cancer Father   . Arthritis Sister   . Arthritis Brother   . Liver cancer Brother   . Heart disease Maternal Grandmother   . Heart disease Maternal Grandfather   . Heart disease Paternal Grandmother   . Heart disease Paternal Grandfather   . Breast cancer Mother   . Other Daughter        house fire  . Breast cancer Maternal Aunt   . Breast cancer Paternal  Aunt   . Colon cancer Neg Hx   . Esophageal cancer Neg Hx   . Rectal cancer Neg Hx   . Stomach cancer Neg Hx     Social History   Socioeconomic History  . Marital status: Married    Spouse name: Not on file  . Number of children: 6  . Years of education: Not on file  . Highest education level: Not on file  Occupational History  . Occupation: retired  Tobacco Use  . Smoking status:  Never Smoker  . Smokeless tobacco: Never Used  Vaping Use  . Vaping Use: Never used  Substance and Sexual Activity  . Alcohol use: No  . Drug use: No  . Sexual activity: Yes    Partners: Male    Birth control/protection: Post-menopausal, Surgical    Comment: Hysterectomy  Other Topics Concern  . Not on file  Social History Narrative   She is retired she is married and lives with her husband   6 children   No alcohol or tobacco never smoker no drug use   Social Determinants of Radio broadcast assistant Strain: Not on file  Food Insecurity: Not on file  Transportation Needs: Not on file  Physical Activity: Not on file  Stress: Not on file  Social Connections: Not on file    ECOG Status: 1 - Symptomatic but completely ambulatory  Review of Systems: A 12 point ROS discussed and pertinent positives are indicated in the HPI above.  All other systems are negative.  Review of Systems  Vital Signs: LMP  (LMP Unknown)   Physical Exam  Mallampati Score:     Imaging: US ABDOMINAL PELVIC ART/VENT FLOW DOPPLER  Result Date: 07/07/2020 CLINICAL DATA:  History of cirrhosis complicated by portal venous hypertension and large esophageal varices with episodes of recurrent bleeding despite aggressive endoscopic treatment. TIPS creation 04/08/2019. She presents for scheduled follow-up. EXAM: DUPLEX ULTRASOUND OF LIVER AND TIPS SHUNT TECHNIQUE: Color and duplex Doppler ultrasound was performed to evaluate the hepatic in-flow and out-flow vessels. COMPARISON:  05/29/2019 FINDINGS: Portal Vein  Velocities Main:  68 cm/sec Right:  93 cm/sec Left: Not visualized TIPS Stent Velocities Proximal:  93 cm/sec Mid:  48 Distal:  68 cm/sec IVC: Present and patent with normal respiratory phasicity. Hepatic Vein Velocities Right:  68 cm/sec Mid:  26 cm/sec Left:  25 cm/sec Splenic Vein: Patent, 68 cm/seconds Superior Mesenteric Vein: Not identified Hepatic Artery: 95 cm/seconds Ascities: None seen Varices: None seen Other findings: 2.5 cm ill-defined hypoechoic region in the mid right lobe, previously 3.8 cm on 05/29/2019, corresponding to resolving hematoma seen on prior MRI. No new lesion. No biliary ductal dilatation. Coarse hepatic echotexture with nodular contour consistent with cirrhosis. IMPRESSION: 1. Continued patency of TIPS shunt. 2. Continued decrease in size of intrahepatic hematoma. No new lesion. Electronically Signed   By: Lucrezia Europe M.D.   On: 07/07/2020 13:31   IR Radiologist Eval & Mgmt  Result Date: 07/07/2020 Please refer to notes tab for details about interventional procedure. (Op Note)   Labs:  CBC: Recent Labs    02/03/20 1009 04/10/20 1145 05/12/20 0948 06/10/20 0826  WBC 3.6* 4.2 3.9* 3.2*  HGB 13.1 13.3 13.5 13.3  HCT 38.8 41.3 40.7 38.0  PLT 102* 70* 98.0* 92*    COAGS: Recent Labs    08/19/19 1529 11/08/19 1030 05/12/20 0948  INR 1.1 1.1* 1.1*    BMP: Recent Labs    08/20/19 0307 09/10/19 0844 10/08/19 0917 11/08/19 1030 04/10/20 1145 05/12/20 0948 06/10/20 0826  NA 139 140 140 140 137 139 141  K 3.9 3.7 3.6 3.6 3.9 3.5 3.9  CL 106 108 107 102 103 104 107  CO2 24 22 27 29 23 26 27   GLUCOSE 148* 128* 137* 141* 131* 220* 112*  BUN 19 11 18 21 19 15 14   CALCIUM 9.1 9.3 9.3 9.9 9.3 9.5 9.4  CREATININE 1.02* 1.00 0.91 0.89 0.77 0.80 0.76  GFRNONAA 55* 56* 63  --  >60  --  78  GFRAA >60 >60 73  --   --   --  90    LIVER FUNCTION TESTS: Recent Labs    08/19/19 1529 09/10/19 0844 10/08/19 0917 11/08/19 1030 02/03/20 1009 05/12/20 0948  06/10/20 0826  BILITOT 2.2* 1.7*   < > 1.8* 1.5* 1.6* 1.3*  AST 63* 79*   < > 55* 46* 47* 42*  ALT 41 51*   < > 36* 35* 35 29  ALKPHOS 144* 141*  --  149*  --  98  --   PROT 7.4 6.3*   < > 6.9 6.3 6.8 6.2  ALBUMIN 3.0* 2.6*  --  3.4*  --  3.4*  --    < > = values in this interval not displayed.    TUMOR MARKERS: Recent Labs    05/12/20 0948  AFPTM 2.5    Assessment and Plan: My impression is that this patient continues to do well after urgent TIPS for variceal bleeding.    Today's vascular ultrasound shows continued good flow through the TIPS with no suggestion of stenosis or occlusion.  Plan continued lactulose p.o. as needed to prevent encephalopathy.  Follow-up with Dr. [name] regarding possible liver transplant evaluation. We will plan a routine follow-up TIPS ultrasound in 12 months to confirm TIPS patency. She knows to call in the interval should she have new abdominal distention, GI bleeding, or other questions about the TIPS.   Thank you for this interesting consult.  I greatly enjoyed meeting [employer] and look forward to participating in their care.  A copy of this report was sent to the requesting provider on this date.  Electronically Signed: Rickard Rhymes 07/29/2020, 10:07 AM   I spent a total of    15 Minutes in face to face in clinical consultation, greater than 50% of which was counseling/coordinating care for cirrhosis, post TIPS.

## 2020-07-29 NOTE — Addendum Note (Signed)
Encounter addended by: Arne Cleveland, MD on: 07/29/2020 10:12 AM  Actions taken: Clinical Note Signed

## 2020-07-30 ENCOUNTER — Other Ambulatory Visit: Payer: Self-pay | Admitting: Internal Medicine

## 2020-07-30 DIAGNOSIS — E785 Hyperlipidemia, unspecified: Secondary | ICD-10-CM

## 2020-07-30 DIAGNOSIS — E1169 Type 2 diabetes mellitus with other specified complication: Secondary | ICD-10-CM

## 2020-07-31 ENCOUNTER — Telehealth: Payer: Self-pay

## 2020-07-31 DIAGNOSIS — K7460 Unspecified cirrhosis of liver: Secondary | ICD-10-CM

## 2020-07-31 NOTE — Telephone Encounter (Signed)
-----   Message from Stevan Born, [place] sent at 05/12/2020  9:45 AM EST ----- Regarding: MRI Liver Patient needs to have MRI Liver in April dx cirrhosis rule out hematoma

## 2020-07-31 NOTE — Telephone Encounter (Signed)
Patient needs MRI liver in April would you like MRI with contrast or MRI with and without contrast?

## 2020-08-03 ENCOUNTER — Other Ambulatory Visit: Payer: Self-pay | Admitting: Gastroenterology

## 2020-08-03 DIAGNOSIS — H35373 Puckering of macula, bilateral: Secondary | ICD-10-CM | POA: Diagnosis not present

## 2020-08-03 DIAGNOSIS — E119 Type 2 diabetes mellitus without complications: Secondary | ICD-10-CM | POA: Diagnosis not present

## 2020-08-03 DIAGNOSIS — H2513 Age-related nuclear cataract, bilateral: Secondary | ICD-10-CM | POA: Diagnosis not present

## 2020-08-03 DIAGNOSIS — H04123 Dry eye syndrome of bilateral lacrimal glands: Secondary | ICD-10-CM | POA: Diagnosis not present

## 2020-08-03 DIAGNOSIS — H524 Presbyopia: Secondary | ICD-10-CM | POA: Diagnosis not present

## 2020-08-03 DIAGNOSIS — Z83511 Family history of glaucoma: Secondary | ICD-10-CM | POA: Diagnosis not present

## 2020-08-03 DIAGNOSIS — H40013 Open angle with borderline findings, low risk, bilateral: Secondary | ICD-10-CM | POA: Diagnosis not present

## 2020-08-03 NOTE — Telephone Encounter (Signed)
I guess I am not sure how the radiologist prefer this to be done.  Can you please reach out to the tech and say this is for hepatoma screening and I will defer to what ever way they think it is best in terms of IV contrast.  Thank you

## 2020-08-05 ENCOUNTER — Ambulatory Visit (INDEPENDENT_AMBULATORY_CARE_PROVIDER_SITE_OTHER): Payer: Medicare Other | Admitting: Family

## 2020-08-05 ENCOUNTER — Other Ambulatory Visit: Payer: Self-pay

## 2020-08-05 ENCOUNTER — Encounter: Payer: Self-pay | Admitting: Family

## 2020-08-05 DIAGNOSIS — M8588 Other specified disorders of bone density and structure, other site: Secondary | ICD-10-CM

## 2020-08-05 DIAGNOSIS — Z0000 Encounter for general adult medical examination without abnormal findings: Secondary | ICD-10-CM

## 2020-08-05 MED ORDER — XIFAXAN 550 MG PO TABS: 550.0000 mg | ORAL_TABLET | Freq: Two times a day (BID) | ORAL | 3 refills | Status: DC

## 2020-08-05 NOTE — Progress Notes (Signed)
Subjective:   [name] is a 75 y.o. female who presents for Medicare Annual (Subsequent) preventive examination.  Review of Systems     Cardiac Risk Factors include: advanced age (>20mn, >>68women);diabetes mellitus;dyslipidemia     Objective:    Today's Vitals   08/05/20 0843  PainSc: 6    There is no height or weight on file to calculate BMI.  Advanced Directives 08/05/2020 06/15/2020 04/10/2020 02/13/2020 09/10/2019 08/26/2019 08/20/2019  Does Patient Have a Medical Advance Directive? Yes Yes No Yes No Yes No  Type of [place]of [place] will Living will;Healthcare Power of [place] facility DNR (pink MOST or yellow form) - [place] will;Out of facility DNR (pink MOST or yellow form) -  Does patient want to make changes to medical advance directive? No - Patient declined No - Patient declined - No - Patient declined - No - Patient declined -  Copy of [place] Chart? Yes - validated most recent copy scanned in chart (See row information) Yes - validated most recent copy scanned in chart (See row information) - - - - -  Would patient like information on creating a medical advance directive? - - Yes (ED - Information included in AVS) - No - Patient declined - No - Patient declined    Current Medications (verified) Outpatient Encounter Medications as of 08/05/2020  Medication Sig  . acetaminophen (TYLENOL) 500 MG tablet Take 500 mg by mouth at bedtime.  . benzonatate (TESSALON) 100 MG capsule Take 1 capsule (100 mg total) by mouth every 8 (eight) hours.  . Biotin 10000 MCG TABS Take 10,000 mcg by mouth every morning.  . colchicine 0.6 MG tablet Take 1 tablet (0.6 mg total) by mouth daily. Discontinue after 3 more months  . Cyanocobalamin (VITAMIN B 12 PO) Take 1,000 mcg by mouth daily.  .[name] Kitchenezetimibe (ZETIA) 10 MG tablet TAKE 1 TABLET BY MOUTH EVERY DAY  . furosemide (LASIX) 20 MG  tablet Take 1 tablet (20 mg total) by mouth daily.  .[name] Kitchenglucose blood test strip One Touch Ultra II strips. Use to test blood sugar three times daily. Dx: E11.65  . Insulin Pen Needle (BD PEN NEEDLE NANO U/F) 32G X 4 MM MISC USE THREE TIMES DAILY AS DIRECTED  . Insulin Syringe-Needle U-100 (INSULIN SYRINGE 1CC/31GX5/16") 31G X 5/16" 1 ML MISC USE AS DIRECTED TO INJECT LEVIMIR  . JARDIANCE 25 MG TABS tablet TAKE 1 TABLET BY MOUTH DAILY  . lactulose (CHRONULAC) 10 GM/15ML solution TAKE 45 ML BY MOUTH THREE TIMES DAILY AS DIRECTED  . LEVEMIR FLEXTOUCH 100 UNIT/ML FlexPen ADMINISTER 30 UNITS UNDER THE SKIN AT BEDTIME  . loratadine (CLARITIN) 10 MG tablet Take 10 mg by mouth daily as needed for allergies.  .[name] KitchenMAGNESIUM PO Take 500 mg by mouth daily.   . metoprolol tartrate (LOPRESSOR) 25 MG tablet Take 1 tablet (25 mg total) by mouth as needed (use as needed for HR greater than 110).  . Multiple Vitamins-Minerals (MULTIVITAMIN WITH MINERALS) tablet Take 1 tablet by mouth daily.  . Omega-3 Fatty Acids (FISH OIL PO) Take 1 capsule by mouth daily.  . pantoprazole (PROTONIX) 40 MG tablet TAKE 1 TABLET(40 MG) BY MOUTH DAILY  . Polyethyl Glycol-Propyl Glycol (SYSTANE OP) Place 1 drop into both eyes daily.   . Probiotic Product (PROBIOTIC DAILY PO) Take 1 capsule by mouth daily. Digestive Advantage Probiotic  . XIFAXAN 550 MG TABS tablet Take 1  tablet (550 mg total) by mouth 2 (two) times daily.  . [DISCONTINUED] XIFAXAN 550 MG TABS tablet Take 1 tablet (550 mg total) by mouth 2 (two) times daily.   No facility-administered encounter medications on file as of 08/05/2020.    Allergies (verified) Kiwi extract, Tdap [tetanus-diphth-acell pertussis], Statins, Latex, and Tramadol   History: Past Medical History:  Diagnosis Date  . A-fib ([place])    08/19/19 afib with RVR, spontaneously converted to SR  . Allergy   . Arthritis    neck  . Cataract    bilateral - MD monitoring cataracts  . CHF (congestive  heart failure) ([place])   . Chronic kidney disease, stage I    DR OTTELIN  HX UTIS  . Cirrhosis ([place])   . Coronary artery disease    s/p CABG 01/29/16: LIMA-LAD, SVG-RI, SVG-dRCA  . COVID-19 virus infection 04/2020  . Cramp of limb   . Diabetes mellitus   . Dysphagia, unspecified(787.20)   . Dysuria   . Epistaxis   . GERD (gastroesophageal reflux disease)   . Hearing loss    wears hearing aids  . Heart murmur     DX FOR YEARS ASYMPTOMATIC  . Hepatic encephalopathy ([place])   . Lumbago   . Myocardial infarction ([place])    NSTEMI 01/27/16  . Neoplasm of uncertain behavior of skin   . Nonspecific elevation of levels of transaminase or lactic acid dehydrogenase (LDH)   . Osteoarthrosis, unspecified whether generalized or localized, unspecified site   . Other and unspecified hyperlipidemia    diet controlled  . Pain in joint, shoulder region   . Paresthesias 04/01/2015  . Postablative ovarian failure   . Trochanteric bursitis of left hip 12/15/2015  . Type 2 diabetes mellitus without complication ([place])   . Unspecified essential hypertension    no meds  . Wears glasses    Past Surgical History:  Procedure Laterality Date  . BACK SURGERY  06/2015  . BREAST BIOPSY Left   . CARDIAC CATHETERIZATION N/A 01/27/2016   Procedure: Left Heart Cath and Coronary Angiography;  Surgeon: Belva Crome, MD;  Location: [facility];  Service: Cardiovascular;  Laterality: N/A;  . COLONOSCOPY  2012   Dr Lajoyce Corners.   . COLONOSCOPY WITH PROPOFOL N/A 07/07/2016   Procedure: COLONOSCOPY WITH PROPOFOL;  Surgeon: [name], MD;  Location: [facility];  Service: Endoscopy;  Laterality: N/A;  . CORONARY ARTERY BYPASS GRAFT N/A 01/28/2016   Procedure: CORONARY ARTERY BYPASS GRAFTING (CABG) x 3 USING RIGHT LEG GREATER SAPHENOUS VEIN GRAFT;  Surgeon: Melrose Nakayama, MD;  Location: [facility];  Service: Open Heart Surgery;  Laterality: N/A;  . ENDOVEIN HARVEST OF GREATER SAPHENOUS VEIN Right 01/28/2016    Procedure: ENDOVEIN HARVEST OF GREATER SAPHENOUS VEIN;  Surgeon: Melrose Nakayama, MD;  Location: [facility];  Service: Open Heart Surgery;  Laterality: Right;  . ESOPHAGEAL BANDING  03/28/2019   Procedure: ESOPHAGEAL BANDING;  Surgeon: [name], MD;  Location: [facility];  Service: Endoscopy;;  . ESOPHAGEAL BANDING  04/07/2019   Procedure: ESOPHAGEAL BANDING;  Surgeon: Juanita Craver, MD;  Location: [facility];  Service: Endoscopy;;  . ESOPHAGOGASTRODUODENOSCOPY N/A 04/07/2019   Procedure: ESOPHAGOGASTRODUODENOSCOPY (EGD);  Surgeon: Juanita Craver, MD;  Location: [facility];  Service: Endoscopy;  Laterality: N/A;  . ESOPHAGOGASTRODUODENOSCOPY (EGD) WITH PROPOFOL N/A 07/07/2016   Procedure: ESOPHAGOGASTRODUODENOSCOPY (EGD) WITH PROPOFOL;  Surgeon: [name], MD;  [facility];  Service: Endoscopy;  Laterality: N/A;  . ESOPHAGOGASTRODUODENOSCOPY (EGD)  WITH PROPOFOL N/A 03/28/2019   Procedure: ESOPHAGOGASTRODUODENOSCOPY (EGD) WITH PROPOFOL;  Surgeon: [name], MD;  [facility];  Service: Endoscopy;  Laterality: N/A;  . HEMOSTASIS CLIP PLACEMENT  04/07/2019   Procedure: HEMOSTASIS CLIP PLACEMENT;  Surgeon: Juanita Craver, MD;  Location: [facility];  Service: Endoscopy;;  . IR ANGIOGRAM SELECTIVE EACH ADDITIONAL VESSEL  04/08/2019  . IR EMBO ART  VEN HEMORR LYMPH EXTRAV  INC GUIDE ROADMAPPING  04/08/2019  . IR PARACENTESIS  04/08/2019  . IR RADIOLOGIST EVAL & MGMT  06/13/2019  . IR RADIOLOGIST EVAL & MGMT  09/25/2019  . IR RADIOLOGIST EVAL & MGMT  07/07/2020  . IR TIPS  04/08/2019  . MAXIMUM ACCESS (MAS)POSTERIOR LUMBAR INTERBODY FUSION (PLIF) 1 LEVEL Left 06/10/2015   Procedure: FOR MAXIMUM ACCESS (MAS) POSTERIOR LUMBAR INTERBODY FUSION (PLIF) LUMBAR THREE-FOUR EXTRAFORAMINAL MICRODISCECTOMY LUMBAR FIVE-SACRAL ONE LEFT;  Surgeon: Eustace Moore, MD;  Location: [facility];  Service: Neurosurgery;  Laterality: Left;  . RADIOLOGY WITH ANESTHESIA N/A 04/08/2019    Procedure: RADIOLOGY WITH ANESTHESIA;  Surgeon: Radiologist, Medication, MD;  Location: [facility];  Service: Radiology;  Laterality: N/A;  . SCLEROTHERAPY  04/07/2019   Procedure: SCLEROTHERAPY;  Surgeon: Juanita Craver, MD;  Location: [facility] ENDOSCOPY;  Service: Endoscopy;;  . TEE WITHOUT CARDIOVERSION N/A 01/28/2016   Procedure: TRANSESOPHAGEAL ECHOCARDIOGRAM (TEE);  Surgeon: Melrose Nakayama, MD;  Location: [facility];  Service: Open Heart Surgery;  Laterality: N/A;  . TUBAL LIGATION  1982   Dr Connye Burkitt  . UMBILICAL HERNIA REPAIR N/A 09/10/2019   Procedure: HERNIA REPAIR UMBILICAL ADULT;  Surgeon: Donnie Mesa, MD;  Location: [facility];  Service: General;  Laterality: N/A;  . UPPER GASTROINTESTINAL ENDOSCOPY    . VAGINAL HYSTERECTOMY  1997   Dr Rande Lawman   Family History  Problem Relation Age of Onset  . Lung cancer Father   . Arthritis Sister   . Arthritis Brother   . Liver cancer Brother   . Heart disease Maternal Grandmother   . Heart disease Maternal Grandfather   . Heart disease Paternal Grandmother   . Heart disease Paternal Grandfather   . Breast cancer Mother   . Other Daughter        house fire  . Breast cancer Maternal Aunt   . Breast cancer Paternal Aunt   . Colon cancer Neg Hx   . Esophageal cancer Neg Hx   . Rectal cancer Neg Hx   . Stomach cancer Neg Hx    Social History   Socioeconomic History  . Marital status: Married    Spouse name: Not on file  . Number of children: 6  . Years of education: Not on file  . Highest education level: Not on file  Occupational History  . Occupation: retired  Tobacco Use  . Smoking status: Never Smoker  . Smokeless tobacco: Never Used  Vaping Use  . Vaping Use: Never used  Substance and Sexual Activity  . Alcohol use: No  . Drug use: No  . Sexual activity: Yes    Partners: Male    Birth control/protection: Post-menopausal, Surgical    Comment: Hysterectomy  Other Topics Concern  . Not on file  Social History Narrative   She is  retired she is married and lives with her husband   6 children   No alcohol or tobacco never smoker no drug use   Social Determinants of Radio broadcast assistant Strain: Not on file  Food Insecurity: Not on file  Transportation  Needs: Not on file  Physical Activity: Not on file  Stress: Not on file  Social Connections: Not on file    Tobacco Counseling Counseling given: Not Answered   Clinical Intake:  Pre-visit preparation completed: No  Pain : 0-10 Pain Score: 6  Pain Type: Chronic pain Pain Location: Back Pain Orientation: Lower Pain Radiating Towards: left leg Pain Descriptors / Indicators: Aching Pain Onset: Other (comment) (several years) Pain Frequency: Constant (when standing) Pain Relieving Factors: extra strength Tylenol,Epidural,sitting and elavating,Heating pad Effect of Pain on Daily Activities: standing  Pain Relieving Factors: extra strength Tylenol,Epidural,sitting and elavating,Heating pad  BMI - recorded: 23.74 Nutritional Status: BMI of 19-24  Normal Nutritional Risks: None Diabetes: Yes CBG done?: Yes (95) CBG resulted in Enter/ Edit results?: No Did pt. bring in CBG monitor from home?: Yes (95 -200's) Glucose Meter Downloaded?: No (telephone)  How often do you need to have someone help you when you read instructions, pamphlets, or other written materials from your doctor or pharmacy?: 1 - Never What is the last grade level you completed in school?: 12 grade  Diabetic?Yes   Interpreter Needed?: No  Information entered by :: Gabriella Woodhead FNP-C   Activities of Daily Living In your present state of health, do you have any difficulty performing the following activities: 08/05/2020 09/10/2019  Hearing? Tempie Donning  Comment wears hearing aids both ears- hearing aids  Vision? N N  Difficulty concentrating or making decisions? Y N  Comment remembering -  Walking or climbing stairs? Y N  Comment uses a cane -  Dressing or bathing? N N  Doing  errands, shopping? N -  Preparing Food and eating ? N -  Using the Toilet? N -  In the past six months, have you accidently leaked urine? N -  Do you have problems with loss of bowel control? N -  Managing your Medications? N -  Managing your Finances? N -  Housekeeping or managing your Housekeeping? N -  Some recent data might be hidden    Patient Care Team: [name], MD as PCP - General (Family Medicine) Josue Hector, MD as PCP - Cardiology (Cardiology) Berle Mull, MD as Consulting Physician (Family Medicine) Sharmon Revere as Physician Assistant (Cardiology) Calvert Cantor, MD as Consulting Physician (Ophthalmology) [name], MD as Attending Physician (Gastroenterology) [name], RN as [place] any recent [place] you may have received from other than [facility] providers in the past year (date may be approximate).     Assessment:   This is a routine wellness examination for [place].  Hearing/Vision screen  Hearing Screening   125Hz  250Hz  500Hz  1000Hz  2000Hz  3000Hz  4000Hz  6000Hz  8000Hz   Right ear:           Left ear:           Comments: No Hearing Concerns. Patient wears hearing aids.   Vision Screening Comments: No Vision Concern. Patient wears prescription reading glasses. Patient last eye exam was 08/03/2020  Dietary issues and exercise activities discussed: Current Exercise Habits: Home exercise routine, Type of exercise: walking, Time (Minutes): 30, Frequency (Times/Week): 2, Weekly Exercise (Minutes/Week): 60, Intensity: Mild, Exercise limited by: Other - see comments (lower back pain)  Goals    . <enter goal here>     Starting 04/19/16, I would like to maintain my current lifestyle.     . Patient Stated     Pt would like to work on portion control     .  [facility] - Monitor and Manage My Blood Sugar     Follow Up Date 07/22/2020   Timeframe:  Short-Term Goal Priority:  High Start Date:       06/24/2020                       Expected End Date:    07/22/2020                   - check blood sugar at prescribed times - check blood sugar if I feel it is too high or too low - take the blood sugar log to all doctor visits    Why is this important?   Checking your blood sugar at home helps to keep it from getting very high or very low.  Writing the results in a diary or log helps the doctor know how to care for you.  Your blood sugar log should have the time, date and the results.  Also, write down the amount of insulin or other medicine that you take.  Other information, like what you ate, exercise done and how you were feeling, will also be helpful.     Notes:   12/9 - Reminded of proper diabetes diet, reviewed medications for glucose control  05/14/2020 - Advised son to continue to educate member regarding proper food choices  2/16 - Educated son on importance of encouraging self monitoring and medication management    . [facility] - Set My Target A1C     Follow Up Date 07/22/2020   Timeframe:  Long-Range Goal Priority:  Medium Start Date:        06/24/2020                     Expected End Date:     09/21/2020                 - set target A1C - less than 7    Why is this important?   Your target A1C is decided together by you and your doctor.  It is based on several things like your age and other health issues.    Notes:   12/9 - Reviewed upcoming appointment with PCP and fasting labs, preparation for new A1C  05/14/2020 - Reminded of labs on 2/2 and PCP visit on 2/7  2/16 - Son reminded of importance of diabetic diet      Depression Screen PHQ 2/9 Scores 08/05/2020 06/15/2020 02/13/2020 08/26/2019 06/12/2019 05/13/2019 04/19/2019  PHQ - 2 Score 0 0 0 0 0 0 0    Fall Risk Fall Risk  08/05/2020 06/15/2020 02/13/2020 10/14/2019 08/26/2019  Falls in the past year? 0 0 0 0 0  Number falls in past yr: 0 0 0 0 1  Injury with Fall? 0 0 0 - 0    FALL RISK PREVENTION PERTAINING TO THE  HOME:  Any stairs in or around the home? Yes  If so, are there any without handrails? Yes  Home free of loose throw rugs in walkways, pet beds, electrical cords, etc? No  Adequate lighting in your home to reduce risk of falls? Yes   ASSISTIVE DEVICES UTILIZED TO PREVENT FALLS:  Life alert? No has one but has not used it  Use of a cane, walker or w/c? Yes  Grab bars in the bathroom? Yes  Shower chair or bench in shower? Yes  Elevated toilet seat or a handicapped toilet? Yes   TIMED UP AND  GO:  Was the test performed? No .  Length of time to ambulate 10 feet: N/A  sec.   Gait slow and steady with assistive device  Cognitive Function: MMSE - Mini Mental State Exam 05/30/2018 05/29/2017 04/19/2016  Orientation to time 5 5 5   Orientation to Place 5 5 5   Registration 3 3 3   Attention/ Calculation 5 5 5   Recall 2 3 3   Language- name 2 objects 2 2 2   Language- repeat 1 1 1   Language- follow 3 step command 3 3 2   Language- read & follow direction 1 1 1   Write a sentence 1 1 1   Copy design 1 1 1   Total score 29 30 29      6CIT Screen 08/05/2020  What Year? 0 points  What month? 0 points  What time? 0 points  Count back from 20 0 points  Months in reverse 0 points  Repeat phrase 0 points  Total Score 0    Immunizations Immunization History  Administered Date(s) Administered  . Fluad Quad(high Dose 65+) 01/31/2019, 02/03/2020  . Hep A / Hep B 06/24/2016, 07/04/2016, 07/21/2016  . Influenza Whole 02/28/2012  . Influenza, High Dose Seasonal PF 01/26/2017, 01/29/2018  . Influenza,inj,Quad PF,6+ Mos 03/27/2013, 01/16/2014, 03/31/2015, 02/26/2016  . Pneumococcal Conjugate-13 06/28/2011  . Pneumococcal Polysaccharide-23 04/19/2016  . Td 01/12/1999    TDAP status: Up to date  Flu Vaccine status: Up to date  Pneumococcal vaccine status: Up to date  Covid-19 vaccine status: Declined, Education has been provided regarding the importance of this vaccine but patient still  declined. Advised may receive this vaccine at local pharmacy or Health Dept.or vaccine clinic. Aware to provide a copy of the vaccination record if obtained from local pharmacy or Health Dept. Verbalized acceptance and understanding.  Qualifies for Shingles Vaccine? Yes   Zostavax completed No   Shingrix Completed?: No.    Education has been provided regarding the importance of this vaccine. Patient has been advised to call insurance company to determine out of pocket expense if they have not yet received this vaccine. Advised may also receive vaccine at local pharmacy or Health Dept. Verbalized acceptance and understanding.  Screening Tests Health Maintenance  Topic Date Due  . COVID-19 Vaccine (1) Never done  . OPHTHALMOLOGY EXAM  08/24/2018  . HEMOGLOBIN A1C  12/08/2020  . FOOT EXAM  06/15/2021  . URINE MICROALBUMIN  06/15/2021  . COLONOSCOPY (Pts 45-49yr Insurance coverage will need to be confirmed)  07/07/2021  . MAMMOGRAM  11/21/2021  . INFLUENZA VACCINE  Completed  . DEXA SCAN  Completed  . Hepatitis C Screening  Completed  . PNA vac Low Risk Adult  Completed  . HPV VACCINES  Aged Out  . TETANUS/TDAP  Discontinued    Health Maintenance  Health Maintenance Due  Topic Date Due  . COVID-19 Vaccine (1) Never done  . OPHTHALMOLOGY EXAM  08/24/2018    Colorectal cancer screening: Type of screening: Colonoscopy. Completed 07/07/2016 . Repeat every 10 years  Mammogram status: Completed 11/22/2019 . Repeat every year  Bone Density status: Completed 04/17/2015. Results reflect: Bone density results: OSTEOPENIA. Repeat every 2 years.  Lung Cancer Screening: (Low Dose CT Chest recommended if Age 212-80years, 30 pack-year currently smoking OR have quit w/in 15years.) does not qualify.   Lung Cancer Screening Referral: No   Additional Screening:  Hepatitis C Screening: does not qualify; Completed Yes   Vision Screening: Recommended annual ophthalmology exams for early detection of  glaucoma and other  disorders of the eye. Is the patient up to date with their annual eye exam?  Yes  Who is the provider or what is the name of the office in which the patient attends annual eye exams? Dr.Digby  If pt is not established with a provider, would they like to be referred to a provider to establish care? No .   Dental Screening: Recommended annual dental exams for proper oral hygiene  Community Resource Referral / Chronic Care Management: CRR required this visit?  No   CCM required this visit?  No      Plan:   - Bone density  - Mammogram due 11/2020  - Shingrix vaccine   I have personally reviewed and noted the following in the patient's chart:   . Medical and social history . Use of alcohol, tobacco or illicit drugs  . Current medications and supplements . Functional ability and status . Nutritional status . Physical activity . Advanced directives . List of other physicians . Hospitalizations, surgeries, and ER visits in previous 12 months . Vitals . Screenings to include cognitive, depression, and falls . Referrals and appointments  In addition, I have reviewed and discussed with patient certain preventive protocols, quality metrics, and best practice recommendations. A written personalized care plan for preventive services as well as general preventive health recommendations were provided to patient.     Sandrea Hughs, NP   08/05/2020   Nurse Notes:Advised to go to her pharmacy and request Shingrix vaccine.Schedules own mammogram appointment.will order Bone density this visit

## 2020-08-05 NOTE — Patient Instructions (Signed)
[name] , Thank you for taking time to come for your Medicare Wellness Visit. I appreciate your ongoing commitment to your health goals. Please review the following plan we discussed and let me know if I can assist you in the future.   Screening recommendations/referrals: Colonoscopy: Up to date  Mammogram : Up to date due July,2022  Bone Density: Ordered today  Recommended yearly ophthalmology/optometry visit for glaucoma screening and checkup Recommended yearly dental visit for hygiene and checkup  Vaccinations: Influenza vaccine : Up to date  Pneumococcal vaccine : Up to date  Tdap vaccine N/A  Shingles vaccine : Please get your Tdap vaccine at your pharmacy    Advanced directives: Yes   Conditions/risks identified: Advance age female > 63 yrs,type 2 DM,dyslidemia   Next appointment: 1 year    Preventive Care 24 Years and Older, Female Preventive care refers to lifestyle choices and visits with your health care provider that can promote health and wellness. What does preventive care include?  A yearly physical exam. This is also called an annual well check.  Dental exams once or twice a year.  Routine eye exams. Ask your health care provider how often you should have your eyes checked.  Personal lifestyle choices, including:  Daily care of your teeth and gums.  Regular physical activity.  Eating a healthy diet.  Avoiding tobacco and drug use.  Limiting alcohol use.  Practicing safe sex.  Taking low-dose aspirin every day.  Taking vitamin and mineral supplements as recommended by your health care provider. What happens during an annual well check? The services and screenings done by your health care provider during your annual well check will depend on your age, overall health, lifestyle risk factors, and family history of disease. Counseling  Your health care provider may ask you questions about your:  Alcohol use.  Tobacco use.  Drug use.  Emotional  well-being.  Home and relationship well-being.  Sexual activity.  Eating habits.  History of falls.  Memory and ability to understand (cognition).  Work and work Statistician.  Reproductive health. Screening  You may have the following tests or measurements:  Height, weight, and BMI.  Blood pressure.  Lipid and cholesterol levels. These may be checked every 5 years, or more frequently if you are over 69 years old.  Skin check.  Lung cancer screening. You may have this screening every year starting at age 31 if you have a 30-pack-year history of smoking and currently smoke or have quit within the past 15 years.  Fecal occult blood test (FOBT) of the stool. You may have this test every year starting at age 42.  Flexible sigmoidoscopy or colonoscopy. You may have a sigmoidoscopy every 5 years or a colonoscopy every 10 years starting at age 74.  Hepatitis C blood test.  Hepatitis B blood test.  Sexually transmitted disease (STD) testing.  Diabetes screening. This is done by checking your blood sugar (glucose) after you have not eaten for a while (fasting). You may have this done every 1-3 years.  Bone density scan. This is done to screen for osteoporosis. You may have this done starting at age 62.  Mammogram. This may be done every 1-2 years. Talk to your health care provider about how often you should have regular mammograms. Talk with your health care provider about your test results, treatment options, and if necessary, the need for more tests. Vaccines  Your health care provider may recommend certain vaccines, such as:  Influenza vaccine. This  is recommended every year.  Tetanus, diphtheria, and acellular pertussis (Tdap, Td) vaccine. You may need a Td booster every 10 years.  Zoster vaccine. You may need this after age 93.  Pneumococcal 13-valent conjugate (PCV13) vaccine. One dose is recommended after age 22.  Pneumococcal polysaccharide (PPSV23) vaccine. One  dose is recommended after age 69. Talk to your health care provider about which screenings and vaccines you need and how often you need them. This information is not intended to replace advice given to you by your health care provider. Make sure you discuss any questions you have with your health care provider. Document Released: 05/22/2015 Document Revised: 01/13/2016 Document Reviewed: 02/24/2015 Elsevier Interactive Patient Education  2017 [place] Prevention in the Home Falls can cause injuries. They can happen to people of all ages. There are many things you can do to make your home safe and to help prevent falls. What can I do on the outside of my home?  Regularly fix the edges of walkways and driveways and fix any cracks.  Remove anything that might make you trip as you walk through a door, such as a raised step or threshold.  Trim any bushes or trees on the path to your home.  Use bright outdoor lighting.  Clear any walking paths of anything that might make someone trip, such as rocks or tools.  Regularly check to see if handrails are loose or broken. Make sure that both sides of any steps have handrails.  Any raised decks and porches should have guardrails on the edges.  Have any leaves, snow, or ice cleared regularly.  Use sand or salt on walking paths during winter.  Clean up any spills in your garage right away. This includes oil or grease spills. What can I do in the bathroom?  Use night lights.  Install grab bars by the toilet and in the tub and shower. Do not use towel bars as grab bars.  Use non-skid mats or decals in the tub or shower.  If you need to sit down in the shower, use a plastic, non-slip stool.  Keep the floor dry. Clean up any water that spills on the floor as soon as it happens.  Remove soap buildup in the tub or shower regularly.  Attach bath mats securely with double-sided non-slip rug tape.  Do not have throw rugs and other  things on the floor that can make you trip. What can I do in the bedroom?  Use night lights.  Make sure that you have a light by your bed that is easy to reach.  Do not use any sheets or blankets that are too big for your bed. They should not hang down onto the floor.  Have a firm chair that has side arms. You can use this for support while you get dressed.  Do not have throw rugs and other things on the floor that can make you trip. What can I do in the kitchen?  Clean up any spills right away.  Avoid walking on wet floors.  Keep items that you use a lot in easy-to-reach places.  If you need to reach something above you, use a strong step stool that has a grab bar.  Keep electrical cords out of the way.  Do not use floor polish or wax that makes floors slippery. If you must use wax, use non-skid floor wax.  Do not have throw rugs and other things on the floor that can make you  trip. What can I do with my stairs?  Do not leave any items on the stairs.  Make sure that there are handrails on both sides of the stairs and use them. Fix handrails that are broken or loose. Make sure that handrails are as long as the stairways.  Check any carpeting to make sure that it is firmly attached to the stairs. Fix any carpet that is loose or worn.  Avoid having throw rugs at the top or bottom of the stairs. If you do have throw rugs, attach them to the floor with carpet tape.  Make sure that you have a light switch at the top of the stairs and the bottom of the stairs. If you do not have them, ask someone to add them for you. What else can I do to help prevent falls?  Wear shoes that:  Do not have high heels.  Have rubber bottoms.  Are comfortable and fit you well.  Are closed at the toe. Do not wear sandals.  If you use a stepladder:  Make sure that it is fully opened. Do not climb a closed stepladder.  Make sure that both sides of the stepladder are locked into place.  Ask  someone to hold it for you, if possible.  Clearly mark and make sure that you can see:  Any grab bars or handrails.  First and last steps.  Where the edge of each step is.  Use tools that help you move around (mobility aids) if they are needed. These include:  Canes.  Walkers.  Scooters.  Crutches.  Turn on the lights when you go into a dark area. Replace any light bulbs as soon as they burn out.  Set up your furniture so you have a clear path. Avoid moving your furniture around.  If any of your floors are uneven, fix them.  If there are any pets around you, be aware of where they are.  Review your medicines with your doctor. Some medicines can make you feel dizzy. This can increase your chance of falling. Ask your doctor what other things that you can do to help prevent falls. This information is not intended to replace advice given to you by your health care provider. Make sure you discuss any questions you have with your health care provider. Document Released: 02/19/2009 Document Revised: 10/01/2015 Document Reviewed: 05/30/2014 Elsevier Interactive Patient Education  2017 [employer].

## 2020-08-05 NOTE — Progress Notes (Signed)
    This service is provided via telemedicine  No vital signs collected/recorded due to the encounter was a telemedicine visit.   Location of patient (ex: home, work): Home.  Patient consents to a telephone visit: Yes.  Location of the provider (ex: office, home): [facility].  Name of any referring provider: [name], MD   Names of all persons participating in the telemedicine service and their role in the encounter: Patient, [name], [place], [place], Webb Silversmith, NP.    Time spent on call: 8 minutes spent on the phone with Medical Assistant.

## 2020-08-06 ENCOUNTER — Other Ambulatory Visit: Payer: Self-pay | Admitting: Internal Medicine

## 2020-08-10 NOTE — Telephone Encounter (Signed)
Spoke with Theodosia Paling at [facility] [phone]) who advised that MRI with contrast be ordered for this patient.  Order entered. Patient will be contacted to schedule.   Called to order imagining and per Aniceto Boss order was entered in error--states it should be ordered as MRI with and without contrast.    Patient has been scheduled for an MRI at [facility] on 08-20-2020. Your appointment time is 9am. Please arrive at 8:15am  for registration purposes and lab work. Please make certain not to have anything to eat or drink 6 hours prior to your test. In addition, if you have any metal in your body, have a pacemaker or defibrillator, please be sure to let your ordering physician know. This test typically takes 45 minutes to 1 hour to complete. Patient aware of appointment date and time. Patient agreed to plan and verbalized understanding. No further questions.

## 2020-08-17 ENCOUNTER — Telehealth: Payer: Self-pay | Admitting: *Deleted

## 2020-08-17 MED ORDER — MONISTAT 3 COMBINATION PACK 200-2 MG-% VA KIT: PACK | VAGINAL | 0 refills | Status: DC

## 2020-08-17 NOTE — Telephone Encounter (Signed)
Recommend topical/vaginal application of miconazole. Rx sent to the pharmacy. To use every night for 3 days.

## 2020-08-17 NOTE — Telephone Encounter (Signed)
Patient notified and agreed.  

## 2020-08-17 NOTE — Telephone Encounter (Signed)
Patient called and stated that she has a yeast infection. Stated that it comes from her medications that she has to take everyday and wonders if something could be called into pharmacy to help.  Please Advise.

## 2020-08-19 ENCOUNTER — Other Ambulatory Visit: Payer: Self-pay | Admitting: *Deleted

## 2020-08-19 NOTE — Patient Outreach (Signed)
[name]) Care Management  [place]  08/19/2020   [name] [birth year] [id number]   Outgoing call placed to member's son, report member is still doing fairly well.  She had some increased swelling in her legs, diuretic was increased.  Encouraged to keep legs elevated and wear compression socks.  Although it has been difficult, they will continue to encourage member to follow diabetic and low salt diet.  Denies any urgent concerns, encouraged to contact this care manager with questions.  Agrees to follow up within the next month.   Encounter Medications:  Outpatient Encounter Medications as of 08/19/2020  Medication Sig  . acetaminophen (TYLENOL) 500 MG tablet Take 500 mg by mouth at bedtime.  . benzonatate (TESSALON) 100 MG capsule Take 1 capsule (100 mg total) by mouth every 8 (eight) hours.  . Biotin 10000 MCG TABS Take 10,000 mcg by mouth every morning.  . colchicine 0.6 MG tablet Take 1 tablet (0.6 mg total) by mouth daily. Discontinue after 3 more months  . Cyanocobalamin (VITAMIN B 12 PO) Take 1,000 mcg by mouth daily.  [name] Kitchen ezetimibe (ZETIA) 10 MG tablet TAKE 1 TABLET BY MOUTH EVERY DAY  . furosemide (LASIX) 20 MG tablet Take 1 tablet (20 mg total) by mouth daily.  [name] Kitchen glucose blood test strip One Touch Ultra II strips. Use to test blood sugar three times daily. Dx: E11.65  . Insulin Pen Needle (BD PEN NEEDLE NANO U/F) 32G X 4 MM MISC USE THREE TIMES DAILY AS DIRECTED  . Insulin Syringe-Needle U-100 (INSULIN SYRINGE 1CC/31GX5/16") 31G X 5/16" 1 ML MISC USE AS DIRECTED TO INJECT LEVIMIR  . JARDIANCE 25 MG TABS tablet TAKE 1 TABLET BY MOUTH DAILY  . lactulose (CHRONULAC) 10 GM/15ML solution TAKE 45 ML BY MOUTH THREE TIMES DAILY AS DIRECTED  . LEVEMIR FLEXTOUCH 100 UNIT/ML FlexPen ADMINISTER 30 UNITS UNDER THE SKIN AT BEDTIME  . loratadine (CLARITIN) 10 MG tablet Take 10 mg by mouth daily as needed for allergies.  [name] Kitchen MAGNESIUM PO Take 500 mg by  mouth daily.   . metoprolol tartrate (LOPRESSOR) 25 MG tablet Take 1 tablet (25 mg total) by mouth as needed (use as needed for HR greater than 110).  . Miconazole Nitrate-Wipes (MONISTAT 3 COMBINATION PACK) 200-2 MG-% KIT Use as directed  . Multiple Vitamins-Minerals (MULTIVITAMIN WITH MINERALS) tablet Take 1 tablet by mouth daily.  . Omega-3 Fatty Acids (FISH OIL PO) Take 1 capsule by mouth daily.  . pantoprazole (PROTONIX) 40 MG tablet TAKE 1 TABLET(40 MG) BY MOUTH DAILY  . Polyethyl Glycol-Propyl Glycol (SYSTANE OP) Place 1 drop into both eyes daily.   . Probiotic Product (PROBIOTIC DAILY PO) Take 1 capsule by mouth daily. Digestive Advantage Probiotic  . XIFAXAN 550 MG TABS tablet Take 1 tablet (550 mg total) by mouth 2 (two) times daily.   No facility-administered encounter medications on file as of 08/19/2020.    Functional Status:  In your present state of health, do you have any difficulty performing the following activities: 08/05/2020 09/10/2019  Hearing? Tempie Donning  Comment wears hearing aids both ears- hearing aids  Vision? N N  Difficulty concentrating or making decisions? Y N  Comment remembering -  Walking or climbing stairs? Y N  Comment uses a cane -  Dressing or bathing? N N  Doing errands, shopping? N -  Preparing Food and eating ? N -  Using the Toilet? N -  In the past six months, have you accidently leaked  urine? N -  Do you have problems with loss of bowel control? N -  Managing your Medications? N -  Managing your Finances? N -  Housekeeping or managing your Housekeeping? N -  Some recent data might be hidden    Fall/Depression Screening: Fall Risk  08/05/2020 06/15/2020 02/13/2020  Falls in the past year? 0 0 0  Number falls in past yr: 0 0 0  Injury with Fall? 0 0 0   PHQ 2/9 Scores 08/05/2020 06/15/2020 02/13/2020 08/26/2019 06/12/2019 05/13/2019 04/19/2019  PHQ - 2 Score 0 0 0 0 0 0 0   Goals Addressed            This Visit's Progress   . [facility] - Monitor and Manage  My Blood Sugar   On track    Follow Up Date 07/22/2020   Timeframe:  Short-Term Goal Priority:  High Start Date:      06/24/2020                       Expected End Date:    07/22/2020                   - check blood sugar at prescribed times - check blood sugar if I feel it is too high or too low - take the blood sugar log to all doctor visits    Why is this important?   Checking your blood sugar at home helps to keep it from getting very high or very low.  Writing the results in a diary or log helps the doctor know how to care for you.  Your blood sugar log should have the time, date and the results.  Also, write down the amount of insulin or other medicine that you take.  Other information, like what you ate, exercise done and how you were feeling, will also be helpful.     Notes:   12/9 - Reminded of proper diabetes diet, reviewed medications for glucose control  05/14/2020 - Advised son to continue to educate member regarding proper food choices  2/16 - Educated son on importance of encouraging self monitoring and medication management  4/13 - Discussed medication management with son, stressed reason to take and keep blood sugars controlled.  Son report recent blood sugars range 120-140's.    [name] Kitchen [facility] - Set My Target A1C   On track    Follow Up Date 07/22/2020   Timeframe:  Long-Range Goal Priority:  Medium Start Date:        06/24/2020                     Expected End Date:     09/21/2020                 - set target A1C - less than 7    Why is this important?   Your target A1C is decided together by you and your doctor.  It is based on several things like your age and other health issues.    Notes:   12/9 - Reviewed upcoming appointment with PCP and fasting labs, preparation for new A1C  05/14/2020 - Reminded of labs on 2/2 and PCP visit on 2/7  2/16 - Son reminded of importance of diabetic diet  4/13 - diabetic plan of care reviewed with son, encouraged to continue to  monitor patients diet         Plan:  Follow-up:  Patient agrees  to Care Plan and Follow-up.  Will follow up within the next month.  [name], [place], MSN [place] [phone]

## 2020-08-20 ENCOUNTER — Ambulatory Visit (HOSPITAL_COMMUNITY)
Admission: RE | Admit: 2020-08-20 | Discharge: 2020-08-20 | Disposition: A | Discharge status: Home / Self Care | Payer: Medicare Other | Source: Ambulatory Visit | Attending: Gastroenterology | Admitting: Gastroenterology

## 2020-08-20 ENCOUNTER — Other Ambulatory Visit: Payer: Self-pay

## 2020-08-20 DIAGNOSIS — K8020 Calculus of gallbladder without cholecystitis without obstruction: Secondary | ICD-10-CM | POA: Diagnosis not present

## 2020-08-20 DIAGNOSIS — K862 Cyst of pancreas: Secondary | ICD-10-CM | POA: Diagnosis not present

## 2020-08-20 DIAGNOSIS — R161 Splenomegaly, not elsewhere classified: Secondary | ICD-10-CM | POA: Diagnosis not present

## 2020-08-20 DIAGNOSIS — K7460 Unspecified cirrhosis of liver: Secondary | ICD-10-CM | POA: Diagnosis not present

## 2020-08-20 MED ORDER — GADOBUTROL 1 MMOL/ML IV SOLN
6.0000 mL | Freq: Once | INTRAVENOUS | Status: AC | PRN
  Administered 2020-08-20: 6 mL via INTRAVENOUS

## 2020-08-26 ENCOUNTER — Other Ambulatory Visit: Payer: Self-pay

## 2020-08-26 ENCOUNTER — Ambulatory Visit
Admission: RE | Admit: 2020-08-26 | Discharge: 2020-08-26 | Disposition: A | Discharge status: Home / Self Care | Payer: Medicare Other | Source: Ambulatory Visit | Attending: Family | Admitting: Family

## 2020-08-26 ENCOUNTER — Encounter: Payer: Self-pay | Admitting: Family

## 2020-08-26 ENCOUNTER — Other Ambulatory Visit: Payer: Self-pay | Admitting: Family

## 2020-08-26 ENCOUNTER — Ambulatory Visit (INDEPENDENT_AMBULATORY_CARE_PROVIDER_SITE_OTHER): Payer: Medicare Other | Admitting: Family

## 2020-08-26 VITALS — BP 108/50 | HR 57 | Temp 97.7°F | Resp 20 | Ht 63.0 in | Wt 132.2 lb

## 2020-08-26 DIAGNOSIS — Z1231 Encounter for screening mammogram for malignant neoplasm of breast: Secondary | ICD-10-CM

## 2020-08-26 DIAGNOSIS — R0989 Other specified symptoms and signs involving the circulatory and respiratory systems: Secondary | ICD-10-CM

## 2020-08-26 DIAGNOSIS — M8588 Other specified disorders of bone density and structure, other site: Secondary | ICD-10-CM

## 2020-08-26 DIAGNOSIS — M19071 Primary osteoarthritis, right ankle and foot: Secondary | ICD-10-CM | POA: Diagnosis not present

## 2020-08-26 DIAGNOSIS — M25471 Effusion, right ankle: Secondary | ICD-10-CM | POA: Diagnosis not present

## 2020-08-26 DIAGNOSIS — M7731 Calcaneal spur, right foot: Secondary | ICD-10-CM | POA: Diagnosis not present

## 2020-08-26 DIAGNOSIS — M7989 Other specified soft tissue disorders: Secondary | ICD-10-CM | POA: Diagnosis not present

## 2020-08-26 DIAGNOSIS — I2510 Atherosclerotic heart disease of native coronary artery without angina pectoris: Secondary | ICD-10-CM

## 2020-08-26 NOTE — Patient Instructions (Addendum)
-    Please get right foot/ankle X-ray at [place] at [facility] then will call you with results.  - Vascular ultrasound of right leg ordered imaging place will call you to schedule for appointment  - continue to wear compression stockings on in the morning and off at bedtime   - Keep legs elevated when seated   - Notify provider if symptoms worsen or develops any redness or leg feels hot/warm

## 2020-08-26 NOTE — Progress Notes (Signed)
Provider: Chiniqua Kilcrease FNP-C  [name], MD  Patient Care Team: [name], MD as PCP - General (Family Medicine) Josue Hector, MD as PCP - Cardiology (Cardiology) Berle Mull, MD as Consulting Physician (Family Medicine) Sharmon Revere as Physician Assistant (Cardiology) Calvert Cantor, MD as Consulting Physician (Ophthalmology) [name], MD as Attending Physician (Gastroenterology) [name], RN as [place] Management  Extended Emergency Contact Information Primary Emergency Contact: [facility] Phone: [phone] Relation: Daughter Secondary Emergency Contact: Blase Mess Address: [address]          [place], [place] [address] Johnnette Litter of [place] Phone: [phone] Mobile Phone: [phone] Relation: Spouse  Code Status:  Full Code  Goals of care: Advanced Directive information Advanced Directives 08/26/2020  Does Patient Have a Medical Advance Directive? Yes  Type of Paramedic of [place];Living will  Does patient want to make changes to medical advance directive? No - Patient declined  Copy of [place] in Chart? Yes - validated most recent copy scanned in chart (See row information)  Would patient like information on creating a medical advance directive? -     Chief Complaint  Patient presents with  . Acute Visit    Patients c/o Still has swelling in right leg and foot    HPI:  Pt is a 75 y.o. female seen today for an acute visit for evaluation of right leg swelling worsen since Sunday x 4 days.Unable to wear her shoes.It was small this morning when she got up.Has wore compression stockings but when she takes off it comes back.No Improvement with diuretic. No redness or increased warm.No injuries to foot. Has been swollen since 2017 after heart surgery.  Denies chest pain, shortness of breath, fatigue, palpitations, weakness, orthopnea, and  PND.Has had no abrupt weight gain.     Past Medical History:  Diagnosis Date  . A-fib ([place])    08/19/19 afib with RVR, spontaneously converted to SR  . Allergy   . Arthritis    neck  . Cataract    bilateral - MD monitoring cataracts  . CHF (congestive heart failure) ([place])   . Chronic kidney disease, stage I    DR OTTELIN  HX UTIS  . Cirrhosis ([place])   . Coronary artery disease    s/p CABG 01/29/16: LIMA-LAD, SVG-RI, SVG-dRCA  . COVID-19 virus infection 04/2020  . Cramp of limb   . Diabetes mellitus   . Dysphagia, unspecified(787.20)   . Dysuria   . Epistaxis   . GERD (gastroesophageal reflux disease)   . Hearing loss    wears hearing aids  . Heart murmur     DX FOR YEARS ASYMPTOMATIC  . Hepatic encephalopathy ([place])   . Lumbago   . Myocardial infarction ([place])    NSTEMI 01/27/16  . Neoplasm of uncertain behavior of skin   . Nonspecific elevation of levels of transaminase or lactic acid dehydrogenase (LDH)   . Osteoarthrosis, unspecified whether generalized or localized, unspecified site   . Other and unspecified hyperlipidemia    diet controlled  . Pain in joint, shoulder region   . Paresthesias 04/01/2015  . Postablative ovarian failure   . Trochanteric bursitis of left hip 12/15/2015  . Type 2 diabetes mellitus without complication ([place])   . Unspecified essential hypertension    no meds  . Wears glasses    Past Surgical History:  Procedure Laterality Date  . BACK SURGERY  06/2015  .  BREAST BIOPSY Left   . CARDIAC CATHETERIZATION N/A 01/27/2016   Procedure: Left Heart Cath and Coronary Angiography;  Surgeon: Belva Crome, MD;  Location: [facility] CV LAB;  Service: Cardiovascular;  Laterality: N/A;  . COLONOSCOPY  2012   Dr Lajoyce Corners.   . COLONOSCOPY WITH PROPOFOL N/A 07/07/2016   Procedure: COLONOSCOPY WITH PROPOFOL;  Surgeon: [name], MD;  Location: [facility] ENDOS[facility];  Service: Endoscopy;  Laterality: N/A;  . CORONARY ARTERY BYPASS GRAFT N/A 01/28/2016   Procedure:  CORONARY ARTERY BYPASS GRAFTING (CABG) x 3 USING RIGHT LEG GREATER SAPHENOUS VEIN GRAFT;  Surgeon: Melrose Nakayama, MD;  Location: [place];  Service: Open Heart Surgery;  Laterality: N/A;  . ENDOVEIN HARVEST OF GREATER SAPHENOUS VEIN Right 01/28/2016   Procedure: ENDOVEIN HARVEST OF GREATER SAPHENOUS VEIN;  Surgeon: Melrose Nakayama, MD;  Location: [facility];  Service: Open Heart Surgery;  Laterality: Right;  . ESOPHAGEAL BANDING  03/28/2019   Procedure: ESOPHAGEAL BANDING;  Surgeon: [name], MD;  Location: [facility];  Service: Endoscopy;;  . ESOPHAGEAL BANDING  04/07/2019   Procedure: ESOPHAGEAL BANDING;  Surgeon: Juanita Craver, MD;  Location[facility] END[facility]COPY;  Service: Endoscopy;;  . ESOPHAGOGASTRODUODENOSCOPY N/A 04/07/2019   Procedure: ESOPHAGOGASTRODUODENOSCOPY (EGD);  Surgeon: Juanita Craver, MD;  Location: [facility] ENDOSCOPY;  Service: Endoscopy;  Laterality: N/A;  . ESOPHAGOGASTRODUODENOSCOPY (EGD) WITH PROPOFOL N/A 07/07/2016   Procedure: ESOPHAGOGASTRODUODENOSCOPY (EGD) WITH PROPOFOL;  Surgeon: [name], MD;  [facility];  Service: Endoscopy;  Laterality: N/A;  . ESOPHAGOGASTRODUODENOSCOPY (EGD) WITH PROPOFOL N/A 03/28/2019   Procedure: ESOPHAGOGASTRODUODENOSCOPY (EGD) WITH PROPOFOL;  Surgeon: [name], MD;  Location[facility];  Service: Endoscopy;  Laterality: N/A;  . HEMOSTASIS CLIP PLACEMENT  04/07/2019   Procedure: HEMOSTASIS CLIP PLACEMENT;  Surgeon: Juanita Craver, MD;  Location: [place] ENDOSCOPY;  Service: Endoscopy;;  . IR ANGIOGRAM SELECTIVE EACH ADDITIONAL VESSEL  04/08/2019  . IR EMBO ART  VEN HEMORR LYMPH EXTRAV  INC GUIDE ROADMAPPING  04/08/2019  . IR PARACENTESIS  04/08/2019  . IR RADIOLOGIST EVAL & MGMT  06/13/2019  . IR RADIOLOGIST EVAL & MGMT  09/25/2019  . IR RADIOLOGIST EVAL & MGMT  07/07/2020  . IR TIPS  04/08/2019  . MAXIMUM ACCESS (MAS)POSTERIOR LUMBAR INTERBODY FUSION (PLIF) 1 LEVEL Left 06/10/2015   Procedure: FOR MAXIMUM ACCESS (MAS)  POSTERIOR LUMBAR INTERBODY FUSION (PLIF) LUMBAR THREE-FOUR EXTRAFORAMINAL MICRODISCECTOMY LUMBAR FIVE-SACRAL ONE LEFT;  Surgeon: Eustace Moore, MD;  Location: [place] [facility] NEURO ORS;  Service: Neurosurgery;  Laterality: Left;  . RADIOLOGY WITH ANESTHESIA N/A 04/08/2019   Procedure: RADIOLOGY WITH ANESTHESIA;  Surgeon: Radiologist, Medication, MD;  Location: [facility];  Service: Radiology;  Laterality: N/A;  . SCLEROTHERAPY  04/07/2019   Procedure: SCLEROTHERAPY;  Surgeon: Juanita Craver, MD;  Location: [facility] ENDOSCOPY;  Service: Endoscopy;;  . TEE WITHOUT CARDIOVERSION N/A 01/28/2016   Procedure: TRANSESOPHAGEAL ECHOCARDIOGRAM (TEE);  Surgeon: Melrose Nakayama, MD;  Location: [facility];  Service: Open Heart Surgery;  Laterality: N/A;  . TUBAL LIGATION  1982   Dr Connye Burkitt  . UMBILICAL HERNIA REPAIR N/A 09/10/2019   Procedure: HERNIA REPAIR UMBILICAL ADULT;  Surgeon: Donnie Mesa, MD;  Location: [facility];  Service: General;  Laterality: N/A;  . UPPER GASTROINTESTINAL ENDOSCOPY    . VAGINAL HYSTERECTOMY  1997   Dr Rande Lawman    Allergies  Allergen Reactions  . Kiwi Extract Anaphylaxis  . Tdap [Tetanus-Diphth-Acell Pertussis] Swelling and Other (See Comments)    Swelling at injection site, gets very hot  .  Statins Other (See Comments)    RHABDOMYOLYSIS  . Latex Itching, Dermatitis and Rash  . Tramadol Nausea And Vomiting    Outpatient Encounter Medications as of 08/26/2020  Medication Sig  . acetaminophen (TYLENOL) 500 MG tablet Take 500 mg by mouth at bedtime.  . Biotin 10000 MCG TABS Take 10,000 mcg by mouth every morning.  . Cyanocobalamin (VITAMIN B 12 PO) Take 1,000 mcg by mouth daily.  [name] Kitchen ezetimibe (ZETIA) 10 MG tablet TAKE 1 TABLET BY MOUTH EVERY DAY  . furosemide (LASIX) 20 MG tablet Take 1 tablet (20 mg total) by mouth daily.  [name] Kitchen glucose blood test strip One Touch Ultra II strips. Use to test blood sugar three times daily. Dx: E11.65  . Insulin Pen Needle (BD PEN NEEDLE NANO U/F) 32G X 4 MM MISC USE THREE  TIMES DAILY AS DIRECTED  . Insulin Syringe-Needle U-100 (INSULIN SYRINGE 1CC/31GX5/16") 31G X 5/16" 1 ML MISC USE AS DIRECTED TO INJECT LEVIMIR  . JARDIANCE 25 MG TABS tablet TAKE 1 TABLET BY MOUTH DAILY  . lactulose (CHRONULAC) 10 GM/15ML solution Take 30 g by mouth daily.  [name] Kitchen LEVEMIR FLEXTOUCH 100 UNIT/ML FlexPen ADMINISTER 30 UNITS UNDER THE SKIN AT BEDTIME  . MAGNESIUM PO Take 500 mg by mouth daily.   . metoprolol tartrate (LOPRESSOR) 25 MG tablet Take 1 tablet (25 mg total) by mouth as needed (use as needed for HR greater than 110).  . Multiple Vitamins-Minerals (MULTIVITAMIN WITH MINERALS) tablet Take 1 tablet by mouth daily.  . Omega-3 Fatty Acids (FISH OIL PO) Take 1 capsule by mouth daily.  . pantoprazole (PROTONIX) 40 MG tablet TAKE 1 TABLET(40 MG) BY MOUTH DAILY  . Polyethyl Glycol-Propyl Glycol (SYSTANE OP) Place 1 drop into both eyes daily.   . Probiotic Product (PROBIOTIC DAILY PO) Take 1 capsule by mouth daily. Digestive Advantage Probiotic  . XIFAXAN 550 MG TABS tablet Take 1 tablet (550 mg total) by mouth 2 (two) times daily.  . [DISCONTINUED] benzonatate (TESSALON) 100 MG capsule Take 1 capsule (100 mg total) by mouth every 8 (eight) hours.  . [DISCONTINUED] colchicine 0.6 MG tablet Take 1 tablet (0.6 mg total) by mouth daily. Discontinue after 3 more months  . [DISCONTINUED] lactulose (CHRONULAC) 10 GM/15ML solution TAKE 45 ML BY MOUTH THREE TIMES DAILY AS DIRECTED  . [DISCONTINUED] loratadine (CLARITIN) 10 MG tablet Take 10 mg by mouth daily as needed for allergies.  . [DISCONTINUED] Miconazole Nitrate-Wipes (MONISTAT 3 COMBINATION PACK) 200-2 MG-% KIT Use as directed   No facility-administered encounter medications on file as of 08/26/2020.    Review of Systems  Constitutional: Negative for appetite change, chills, fatigue, fever and unexpected weight change.  Eyes: Negative for pain, discharge, redness, itching and visual disturbance.  Respiratory: Negative for cough,  chest tightness, shortness of breath and wheezing.   Cardiovascular: Positive for leg swelling. Negative for chest pain and palpitations.  Gastrointestinal: Negative for abdominal distention, abdominal pain, constipation, diarrhea, nausea and vomiting.  Musculoskeletal: Negative for arthralgias, back pain, gait problem, joint swelling, myalgias, neck pain and neck stiffness.       Right foot and ankle swelling per HPI   Skin: Negative for color change, pallor, rash and wound.  Neurological: Negative for dizziness, syncope, speech difficulty, weakness, light-headedness, numbness and headaches.  Hematological: Does not bruise/bleed easily.    Immunization History  Administered Date(s) Administered  . Fluad Quad(high Dose 65+) 01/31/2019, 02/03/2020  . Hep A / Hep B 06/24/2016, 07/04/2016, 07/21/2016  . Influenza Whole 02/28/2012  .  Influenza, High Dose Seasonal PF 01/26/2017, 01/29/2018  . Influenza,inj,Quad PF,6+ Mos 03/27/2013, 01/16/2014, 03/31/2015, 02/26/2016  . Pneumococcal Conjugate-13 06/28/2011  . Pneumococcal Polysaccharide-23 04/19/2016  . Td 01/12/1999   Pertinent  Health Maintenance Due  Topic Date Due  . OPHTHALMOLOGY EXAM  08/24/2018  . INFLUENZA VACCINE  12/07/2020  . HEMOGLOBIN A1C  12/08/2020  . FOOT EXAM  06/15/2021  . URINE MICROALBUMIN  06/15/2021  . COLONOSCOPY (Pts 45-73yr Insurance coverage will need to be confirmed)  07/07/2021  . MAMMOGRAM  11/21/2021  . DEXA SCAN  Completed  . PNA vac Low Risk Adult  Completed   Fall Risk  08/26/2020 08/05/2020 06/15/2020 02/13/2020 10/14/2019  Falls in the past year? 0 0 0 0 0  Number falls in past yr: 0 0 0 0 0  Injury with Fall? 0 0 0 0 -   Functional Status Survey:    Vitals:   08/26/20 0935  BP: (!) 108/50  Pulse: (!) 57  Resp: 20  Temp: 97.7 F (36.5 C)  TempSrc: Temporal  SpO2: 99%  Weight: 132 lb 3.2 oz (60 kg)  Height: 5' 3"  (1.6 m)   Body mass index is 23.42 kg/m. Physical Exam Vitals reviewed.   Constitutional:      General: She is not in acute distress.    Appearance: Normal appearance. She is normal weight. She is not ill-appearing or diaphoretic.  HENT:     Head: Normocephalic.     Mouth/Throat:     Mouth: Mucous membranes are moist.     Pharynx: Oropharynx is clear. No oropharyngeal exudate or posterior oropharyngeal erythema.  Eyes:     General: No scleral icterus.       Right eye: No discharge.        Left eye: No discharge.     Conjunctiva/sclera: Conjunctivae normal.     Pupils: Pupils are equal, round, and reactive to light.  Cardiovascular:     Rate and Rhythm: Normal rate and regular rhythm.     Pulses:          Dorsalis pedis pulses are 1+ on the right side.     Heart sounds: Murmur heard.  No friction rub. No gallop.   Pulmonary:     Effort: Pulmonary effort is normal. No respiratory distress.     Breath sounds: Normal breath sounds. No wheezing, rhonchi or rales.  Chest:     Chest wall: No tenderness.  Abdominal:     General: Bowel sounds are normal. There is no distension.     Palpations: Abdomen is soft. There is no mass.     Tenderness: There is no abdominal tenderness. There is no right CVA tenderness, left CVA tenderness, guarding or rebound.  Musculoskeletal:        General: No tenderness. Normal range of motion.     Right lower leg: Edema present.     Left lower leg: Edema present.     Right ankle: Swelling present. No ecchymosis. No tenderness. Normal range of motion.     Comments: Right malleolus swelling > the left non-tender to palpation and without any erythema.   Skin:    General: Skin is warm and dry.     Coloration: Skin is not pale.     Findings: No bruising, erythema, lesion or rash.  Neurological:     Mental Status: She is alert and oriented to person, place, and time.     Cranial Nerves: No cranial nerve deficit.     Sensory: No  sensory deficit.     Motor: No weakness.     Coordination: Coordination normal.     Gait: Gait  normal.  Psychiatric:        Mood and Affect: Mood normal.        Speech: Speech normal.        Behavior: Behavior normal.        Thought Content: Thought content normal.        Judgment: Judgment normal.     Labs reviewed: Recent Labs    04/10/20 1145 05/12/20 0948 06/10/20 0826  NA 137 139 141  K 3.9 3.5 3.9  CL 103 104 107  CO2 23 26 27   GLUCOSE 131* 220* 112*  BUN 19 15 14   CREATININE 0.77 0.80 0.76  CALCIUM 9.3 9.5 9.4   Recent Labs    09/10/19 0844 10/08/19 0917 11/08/19 1030 02/03/20 1009 05/12/20 0948 06/10/20 0826  AST 79*   < > 55* 46* 47* 42*  ALT 51*   < > 36* 35* 35 29  ALKPHOS 141*  --  149*  --  98  --   BILITOT 1.7*   < > 1.8* 1.5* 1.6* 1.3*  PROT 6.3*   < > 6.9 6.3 6.8 6.2  ALBUMIN 2.6*  --  3.4*  --  3.4*  --    < > = values in this interval not displayed.   Recent Labs    02/03/20 1009 04/10/20 1145 05/12/20 0948 06/10/20 0826  WBC 3.6* 4.2 3.9* 3.2*  NEUTROABS 2,045 2.7  --  1,690  HGB 13.1 13.3 13.5 13.3  HCT 38.8 41.3 40.7 38.0  MCV 89.6 95.8 93.0 91.3  PLT 102* 70* 98.0* 92*   Lab Results  Component Value Date   TSH 3.96 10/08/2019   Lab Results  Component Value Date   HGBA1C 9.4 (H) 06/10/2020   Lab Results  Component Value Date   CHOL 161 02/03/2020   HDL 76 02/03/2020   LDLCALC 69 02/03/2020   TRIG 77 02/03/2020   CHOLHDL 2.1 02/03/2020    Significant Diagnostic Results in last 30 days:  MR LIVER W WO CONTRAST  Result Date: 08/22/2020 CLINICAL DATA:  75 year old female with history of cirrhosis. Evaluate for hepatoma. EXAM: MRI ABDOMEN WITHOUT AND WITH CONTRAST TECHNIQUE: Multiplanar multisequence MR imaging of the abdomen was performed both before and after the administration of intravenous contrast. CONTRAST:  28m GADAVIST GADOBUTROL 1 MMOL/ML IV SOLN COMPARISON:  Abdominal MRI 09/06/2019. FINDINGS: Lower chest: Unremarkable. Hepatobiliary: Status post TIPS. Shunt is patent non post gadolinium images. No suspicious  cystic or solid hepatic lesions. Specifically, no hypervascular lesion identified to suggest hepatocellular carcinoma. No intra or extrahepatic biliary ductal dilatation. Numerous small filling defects within the gallbladder, compatible with gallstones. No evidence to suggest acute cholecystitis at this time. No filling defect within the common bile duct to suggest choledocholithiasis. Pancreas: Multiple tiny T1 hypointense, T2 hyperintense, nonenhancing lesions are again noted in the pancreas, similar to the prior examination, largest of which is in the region of the neck of the pancreas (axial image 16 of series 27 and coronal image 10 of series 7) which measures 1.4 x 0.8 x 0.8 cm, similar in retrospect compared to the prior examination. No definitive communication with the main pancreatic duct confidently identified. No other suspicious appearing pancreatic mass is noted. No pancreatic ductal dilatation. No peripancreatic fluid collections or inflammatory changes are noted. Spleen: Spleen is enlarged measuring 13.2 x 6.0 x 11.9 cm (estimated splenic volume  of 471 mL) . Adrenals/Urinary Tract: Bilateral kidneys and adrenal glands are normal in appearance. No hydroureteronephrosis in the visualized portions of the abdomen. Stomach/Bowel: Visualized portions are unremarkable. Vascular/Lymphatic: No aneurysm identified in the visualized abdominal vasculature. Portal vein is patent measuring up to 1.7 cm in the porta hepatis. Patent TIPS. No lymphadenopathy noted in the abdomen. Other: No significant volume of ascites noted in the visualized portions of the peritoneal cavity. Musculoskeletal: No aggressive appearing osseous lesions are noted in the visualized portions of the skeleton. Susceptibility artifact in the lumbar spine related to indwelling spinal hardware. IMPRESSION: 1. No suspicious hepatic lesion to suggest hepatocellular carcinoma. 2. Status post TIPS placement with patent shunt. 3. Cholelithiasis  without evidence of acute cholecystitis. No choledocholithiasis or findings of biliary tract obstruction. 4. Multiple small cystic lesions in the pancreas, similar in size and number to the prior study, favored to be benign, likely small pancreatic pseudocysts or side branch IPMNs (intraductal papillary mucinous neoplasm). Follow-up abdominal MRI with and without IV gadolinium with MRCP is recommended in 2 years to ensure continued stability. This recommendation follows ACR consensus guidelines: Management of Incidental Pancreatic Cysts: A White Paper of the ACR Incidental Findings Committee. [place] [id number];80:[id number]. 5. Additional incidental findings, as above. Electronically Signed   By: Vinnie Langton M.D.   On: 08/22/2020 06:45    Assessment/Plan  1. Swelling of right foot Right foot more swollen compared to left non-tender to palpation and without any erythema. Will obtain imaging. - continue to wear compression stockings on in the morning and off at bedtime  - Keep legs elevated when seated  -  Please get right foot/ankle X-ray at [place] at [facility] then will call you with results. - DG Foot Complete Right; Future - DG Ankle Complete Right; Future - Notify provider if symptoms worsen or develops any redness or leg feels hot/warm   2. Diminished pulses in lower extremity Diminished right dorsal pedal pulse to palpation compared to left pedal pulse.will obtain ABI.Made aware imaging center will call for appointment.  - VAS [place] ABI WITH/WO TBI; Future  3. Right ankle swelling Right malleolus swelling > the left non-tender to palpation and without any erythema.  -  Please get right foot/ankle X-ray at [place] at [facility] then will call you with results. - DG Foot Complete Right; Future - DG Ankle Complete Right; Future   Family/ staff Communication: Reviewed plan of care with patient verbalized understanding.   Labs/tests  ordered:  - DG Foot Complete Right; Future - DG Ankle Complete Right; Future - VAS [place] ABI WITH/WO TBI; Future  Next Appointment: Has upcoming appointment with Dr.Miller in place   Sandrea Hughs, NP

## 2020-08-27 ENCOUNTER — Ambulatory Visit (HOSPITAL_COMMUNITY)
Admission: RE | Admit: 2020-08-27 | Discharge: 2020-08-27 | Disposition: A | Discharge status: Home / Self Care | Payer: Medicare Other | Source: Ambulatory Visit | Attending: Family | Admitting: Family

## 2020-08-27 DIAGNOSIS — R0989 Other specified symptoms and signs involving the circulatory and respiratory systems: Secondary | ICD-10-CM | POA: Diagnosis not present

## 2020-08-31 ENCOUNTER — Telehealth: Payer: Self-pay

## 2020-08-31 NOTE — Telephone Encounter (Signed)
[name],   I received prior auth from [facility] with approval # [id number] for Xifaxin. [name] is approved from 08-25-2020 to 03-02-2021. I will have this scanned in to the patients chart.   Please let me know if there is anything else you need.   Thank you,  Elmyra Ricks        Previous Messages   ----- Message -----  From: Jason Fila, CPhT  Sent: 08/28/2020 12:00 PM EDT  To: Stevan Born, CMA   Hi Elmyra Ricks,  Just checking in to see if [facility] was able to obtain the PA for the Xifaxan and if Patient Assistance still needed to be pursued.  Thanks,  [name]  ----- Message -----  From: Stevan Born, CMA  Sent: 08/13/2020  2:22 PM EDT  To: Luiz Ochoa Simcox, CPhT   [name],   I am sorry for the delayed response. I have been out of the office and while I was out my coworkers covered my box. My coworker sent the information to [facility], in hopes they would obtain prior authorization. At this point I am still waiting to hear back from [facility].   As soon as I hear back from [facility], I will make you aware.   I hope this is helpful.   Thank you,  Elmyra Ricks  ----- Message -----  From: Jason Fila, CPhT  Sent: 08/12/2020  9:56 AM EDT  To: Stevan Born, CMA   Hi Elmyra Ricks,   I am just following up to see if this PA was able to be completed so that I could request the information from [facility] to submit to [facility] patient assistance and to see if we will have to send a corrected provider letter.    Thanks,   Luiz Ochoa. Simcox, [place]  [phone]     ----- Message -----  From: Jason Fila, CPhT  Sent: 08/03/2020  1:15 PM EDT  To: Stevan Born, CMA   Hi Elmyra Ricks,   I followed up with [facility] this am about patient's Xifaxan patient assistance application.   The application is still pending based on the cost of the medication and they were unable to tell from the report that her son [name] got from the pharmacy if the 3000  dollar cost was with or without insurance. Spoke to [place] and he said it was with insurance b/c without insurance it was going to be closer to 7000 dollars.   So I called [facility] to inquire about the cost of the Xifaxan. The representative I spoke to said that she processed the claim it came back as requiring a PA and she was going to fax that over to the office via cover my meds for completion.   Would you please let me know when the PA is completed b/c I will need a copy of the approval or denial letter to submit to [facility]. I will then call [facility] back and see if I can get an accurate portrayal of the medication cost and subsequent printout/report to submit to [facility] and also to see if we will have to send a corrected letter.       Thanks for your time,   Luiz Ochoa. Simcox, [place]  [phone]  [phone]   ----- Message -----  From: Jason Fila, CPhT  Sent: 06/16/2020 11:01 AM EDT  To: Stevan Born, [place] Elmyra Ricks,   I  received the printout from [facility] that was sent to me by her son [name].   The cost was $3283.29 on the printout and per [facility] that is for a 30 days supply.    Thanks,  [name]  ----- Message -----  From: Stevan Born, CMA  Sent: 06/12/2020  2:07 PM EST  To: Luiz Ochoa Simcox, CPhT   Good afternoon [name],   I have [facility] for Xifaxin 561m one [facility] twice [facility]   Please let me know once you receive the copy of the report for out of pocket cost, and I will gladly write you a letter of medical necessity to [facility]    Thank you,     NLeanne Chang CMA   ----- Message -----  From: SJason Fila CPhT  Sent: 06/11/2020  9:35 AM EST  To: NStevan Born CMA   Good am!   [facility] we are assisting Dr. JArdis Hughspatient [facility]ifaxan [facility]   I followed up with [facility] this am and they are needing a few additional  pieces of information to complete the appeals process as patient was initially denied this year.   I was wondering if a [facility] could [facility] they are in need of her out of pocket cost for the [place]. I spoke to her son CGerald Stabsthis am and he is willing to go by the pharmacy and pick up a copy of that report.   Additionally the representative [facility]aryn [facility] that the provider would need to write a letter of medical necessity on letterhead that includes the patient's name, medication name, and qty and cost of the medication with the associated qty as well as reason why patient needs medication.I could relay the cost of the medication to you once I receive the report back from CGerald Stabsis the mail.   Please call me with any additional questions you may have of the above information.   Thanks,   JLuiz Ochoa Simcox, [place] [phone]

## 2020-09-01 NOTE — Telephone Encounter (Signed)
Pt wants to know if Dr. [name] wants her to keep taking Xifaxan because pt states that her copay is over $800. Pls call her.

## 2020-09-03 NOTE — Telephone Encounter (Addendum)
Patient called to follow up on previous message. Stated she is still waiting on a call from office to advise on her Xifaxan medication. Please call patient

## 2020-09-03 NOTE — Telephone Encounter (Signed)
Patient advised that I have contacted [facility] who was able to get prior authorization for Xifaxan. Patient advised that she will still owe $807.22 for a 30 day supply. Patient states that Doreene Nest is too expensive for her budget.  I explained to the patient that she should qualify for patient assistance.  I advised that all of this information has been sent to [facility] with [employer] as she had previously been trying to obtain the information required for patient assistance program.  Patient advised to follow up with Sharee Pimple in several days to see if she had any further input.  Patient agreed to plan and verbalized understanding.  No further questions.

## 2020-09-11 ENCOUNTER — Other Ambulatory Visit: Payer: Self-pay | Admitting: Gastroenterology

## 2020-09-15 ENCOUNTER — Other Ambulatory Visit: Payer: Self-pay | Admitting: *Deleted

## 2020-09-15 NOTE — Patient Outreach (Signed)
[facility] [facility]) Care Management  09/15/2020  Kialee Kham 1945/09/24 [id number]   Outgoing call placed to member's son, [name], no answer, HIPAA compliant voice message left.  Will follow up within the next 3-4 business days.  [name], [place], MSN [place] [phone]

## 2020-09-17 DIAGNOSIS — I10 Essential (primary) hypertension: Secondary | ICD-10-CM | POA: Diagnosis not present

## 2020-09-17 DIAGNOSIS — M5416 Radiculopathy, lumbar region: Secondary | ICD-10-CM | POA: Diagnosis not present

## 2020-09-18 ENCOUNTER — Other Ambulatory Visit: Payer: Self-pay | Admitting: *Deleted

## 2020-09-18 NOTE — Telephone Encounter (Signed)
Spoke with Ophie's son [name] and he is waiting to hear from [place] if patient has been approved for assistance program.  Bronson Curb to contact me with any further questions or concerns.

## 2020-09-18 NOTE — Telephone Encounter (Signed)
Patient is calling to follow up on previous message.

## 2020-09-18 NOTE — Patient Outreach (Signed)
[facility] [facility]) Care Management  09/18/2020  [name] 07-01-1945 [id number]   Outreach attempt #2, unsuccessful, HIPAA compliant voice message left.  Will send outreach letter and follow up within the next 3-4 business days.  [name], [place], MSN [place] [phone]

## 2020-09-24 ENCOUNTER — Telehealth: Payer: Self-pay

## 2020-09-24 ENCOUNTER — Other Ambulatory Visit: Payer: Self-pay | Admitting: *Deleted

## 2020-09-24 NOTE — Telephone Encounter (Signed)
Patient was called and notified that prescription "Levemir" has been dropped off to [employer] senior care office. Medication came from patient assistance program. Patient was also called to confirm new PCP. Patient didn't answer so voicemail was left.

## 2020-09-24 NOTE — Patient Outreach (Signed)
[place] [facility]) Care Management  09/24/2020  Kalanie Fewell 1945/11/16 [id number]   Outreach attempt #3, successful to son.  State member is doing well, no ED visits or hospitalizations.  Denies any urgent concerns, encouraged to contact this care manager with questions.  Agrees to follow up within the next 3 months.  Goals Addressed            This Visit's Progress   . [facility] - Monitor and Manage My Blood Sugar   On track    Barriers: Health Behaviors    Timeframe:  Short-Term Goal Priority:  High Start Date:      5/19 (restarted, member not always consistent)                    Expected End Date:    8/19             - check blood sugar at prescribed times - check blood sugar if I feel it is too high or too low - take the blood sugar log to all doctor visits    Why is this important?   Checking your blood sugar at home helps to keep it from getting very high or very low.  Writing the results in a diary or log helps the doctor know how to care for you.  Your blood sugar log should have the time, date and the results.  Also, write down the amount of insulin or other medicine that you take.  Other information, like what you ate, exercise done and how you were feeling, will also be helpful.     Notes:   12/9 - Reminded of proper diabetes diet, reviewed medications for glucose control  05/14/2020 - Advised son to continue to educate member regarding proper food choices  2/16 - Educated son on importance of encouraging self monitoring and medication management  4/13 - Discussed medication management with son, stressed reason to take and keep blood sugars controlled.  Son report recent blood sugars range 120-140's.  5/19 - Per son, blood sugars range 130-150's, not consistent with diet and medication    . [facility] - Set My Target A1C   On track    Barriers: Health Behaviors Knowledge    Timeframe:  Long-Range Goal Priority:  Medium Start Date:        5/19                 Expected End Date:     11/19               - set target A1C - less than 7    Why is this important?   Your target A1C is decided together by you and your doctor.  It is based on several things like your age and other health issues.    Notes:   12/9 - Reviewed upcoming appointment with PCP and fasting labs, preparation for new A1C  05/14/2020 - Reminded of labs on 2/2 and PCP visit on 2/7  2/16 - Son reminded of importance of diabetic diet  4/13 - diabetic plan of care reviewed with son, encouraged to continue to monitor patients diet  5/19 - Diabetes education sent to [facility].  Reminded of upcoming appointment on 6/8, new A1C will be resulted      [name], RN, MSN [place] Manager [phone]

## 2020-09-25 NOTE — Telephone Encounter (Signed)
Patient notified and agreed.   Patient stated that Dr. Sabra Heck is her PCP.  PCP in computer changed from Dr. Mariea Clonts (change made on 5/13) to Dr. Sabra Heck per patient request.

## 2020-09-29 NOTE — Telephone Encounter (Signed)
Patient's son [name] notified that patient has been approved for [facility] Patient Assistance Program and is eligible until May 08, 2021.  [name] advised to call [phone] for ongoing refills.  [name] verbalized understanding and agreed to plan.  No further questions.

## 2020-10-02 ENCOUNTER — Other Ambulatory Visit: Payer: Self-pay

## 2020-10-02 DIAGNOSIS — K7460 Unspecified cirrhosis of liver: Secondary | ICD-10-CM

## 2020-10-02 DIAGNOSIS — I5032 Chronic diastolic (congestive) heart failure: Secondary | ICD-10-CM

## 2020-10-02 DIAGNOSIS — R188 Other ascites: Secondary | ICD-10-CM

## 2020-10-02 DIAGNOSIS — E1165 Type 2 diabetes mellitus with hyperglycemia: Secondary | ICD-10-CM

## 2020-10-02 DIAGNOSIS — Z794 Long term (current) use of insulin: Secondary | ICD-10-CM

## 2020-10-13 ENCOUNTER — Ambulatory Visit: Payer: Medicare Other | Admitting: Orthopedic Surgery

## 2020-10-13 ENCOUNTER — Other Ambulatory Visit: Payer: Medicare Other

## 2020-10-13 ENCOUNTER — Other Ambulatory Visit: Payer: Self-pay

## 2020-10-13 DIAGNOSIS — E1165 Type 2 diabetes mellitus with hyperglycemia: Secondary | ICD-10-CM | POA: Diagnosis not present

## 2020-10-13 DIAGNOSIS — L659 Nonscarring hair loss, unspecified: Secondary | ICD-10-CM | POA: Diagnosis not present

## 2020-10-13 DIAGNOSIS — I5032 Chronic diastolic (congestive) heart failure: Secondary | ICD-10-CM | POA: Diagnosis not present

## 2020-10-13 DIAGNOSIS — Z794 Long term (current) use of insulin: Secondary | ICD-10-CM | POA: Diagnosis not present

## 2020-10-13 DIAGNOSIS — K7460 Unspecified cirrhosis of liver: Secondary | ICD-10-CM

## 2020-10-13 DIAGNOSIS — R188 Other ascites: Secondary | ICD-10-CM | POA: Diagnosis not present

## 2020-10-14 ENCOUNTER — Encounter: Payer: Self-pay | Admitting: Family Medicine

## 2020-10-14 ENCOUNTER — Ambulatory Visit (INDEPENDENT_AMBULATORY_CARE_PROVIDER_SITE_OTHER): Payer: Medicare Other | Admitting: Family Medicine

## 2020-10-14 ENCOUNTER — Other Ambulatory Visit: Payer: Self-pay

## 2020-10-14 VITALS — BP 126/58 | HR 70 | Temp 97.5°F | Wt 131.4 lb

## 2020-10-14 DIAGNOSIS — R030 Elevated blood-pressure reading, without diagnosis of hypertension: Secondary | ICD-10-CM | POA: Diagnosis not present

## 2020-10-14 DIAGNOSIS — L719 Rosacea, unspecified: Secondary | ICD-10-CM

## 2020-10-14 DIAGNOSIS — I10 Essential (primary) hypertension: Secondary | ICD-10-CM | POA: Diagnosis not present

## 2020-10-14 DIAGNOSIS — I8500 Esophageal varices without bleeding: Secondary | ICD-10-CM

## 2020-10-14 DIAGNOSIS — I2510 Atherosclerotic heart disease of native coronary artery without angina pectoris: Secondary | ICD-10-CM | POA: Diagnosis not present

## 2020-10-14 DIAGNOSIS — L659 Nonscarring hair loss, unspecified: Secondary | ICD-10-CM

## 2020-10-14 DIAGNOSIS — M48062 Spinal stenosis, lumbar region with neurogenic claudication: Secondary | ICD-10-CM | POA: Diagnosis not present

## 2020-10-14 DIAGNOSIS — D709 Neutropenia, unspecified: Secondary | ICD-10-CM | POA: Diagnosis not present

## 2020-10-14 NOTE — Progress Notes (Signed)
Provider:  Alain Honey, MD  Careteam: Patient Care Team: [name], MD as PCP - General (Family Medicine) Josue Hector, MD as PCP - Cardiology (Cardiology) Berle Mull, MD as Consulting Physician (Family Medicine) Sharmon Revere as Physician Assistant (Cardiology) Calvert Cantor, MD as Consulting Physician (Ophthalmology) [name], MD as Attending Physician (Gastroenterology) [name], RN as [place] Management  PLACE OF SERVICE:  [place]  Advanced Directive information    Allergies  Allergen Reactions  . Kiwi Extract Anaphylaxis  . Tdap [Tetanus-Diphth-Acell Pertussis] Swelling and Other (See Comments)    Swelling at injection site, gets very hot  . Statins Other (See Comments)    RHABDOMYOLYSIS  . Latex Itching, Dermatitis and Rash  . Tramadol Nausea And Vomiting    Chief Complaint  Patient presents with  . Medical Management of Chronic Issues    Patient presents today for a 4 month follow-up for diabetes. Her last A1C was a 9.4 on 06/10/2020.     HPI: Patient is a 75 y.o. female discussed labs with patient at her visit most recent A1c yesterday was 9.4.  This has been typical pattern over recent months.  She endorses liking her sweets and tells me about the meats she eats but I think this is more related to her liver issues.  Stressed importance of carbs where diabetes is concerned.  She has had cirrhosis and esophageal varices with portal shot.  She is followed by gastroenterology for liver issues and takes lactulose as well as Xifaxan for that problem. She is also has some areas on her face suggestive of seborrheic keratosis and seborrheic dermatitis.  Also a lesion on her right lower leg that intermittently bothers her.  Review of Systems:  Review of Systems  Constitutional: Negative.   HENT: Negative.   Respiratory: Negative.   Cardiovascular: Positive for leg swelling.       Right leg only suggesting  probable venous insufficiency in that leg  Genitourinary: Negative.   Musculoskeletal: Positive for back pain.  Skin: Positive for rash.  Neurological: Negative.   Psychiatric/Behavioral: Negative.   All other systems reviewed and are negative.   Past Medical History:  Diagnosis Date  . A-fib ([place])    08/19/19 afib with RVR, spontaneously converted to SR  . Allergy   . Arthritis    neck  . Cataract    bilateral - MD monitoring cataracts  . CHF (congestive heart failure) ([place])   . Chronic kidney disease, stage I    DR OTTELIN  HX UTIS  . Cirrhosis ([place])   . Coronary artery disease    s/p CABG 01/29/16: LIMA-LAD, SVG-RI, SVG-dRCA  . COVID-19 virus infection 04/2020  . Cramp of limb   . Diabetes mellitus   . Dysphagia, unspecified(787.20)   . Dysuria   . Epistaxis   . GERD (gastroesophageal reflux disease)   . Hearing loss    wears hearing aids  . Heart murmur     DX FOR YEARS ASYMPTOMATIC  . Hepatic encephalopathy ([place])   . Lumbago   . Myocardial infarction ([place])    NSTEMI 01/27/16  . Neoplasm of uncertain behavior of skin   . Nonspecific elevation of levels of transaminase or lactic acid dehydrogenase (LDH)   . Osteoarthrosis, unspecified whether generalized or localized, unspecified site   . Other and unspecified hyperlipidemia    diet controlled  . Pain in joint, shoulder region   . Paresthesias 04/01/2015  . Postablative  ovarian failure   . Trochanteric bursitis of left hip 12/15/2015  . Type 2 diabetes mellitus without complication ([place])   . Unspecified essential hypertension    no meds  . Wears glasses    Past Surgical History:  Procedure Laterality Date  . BACK SURGERY  06/2015  . BREAST BIOPSY Left   . CARDIAC CATHETERIZATION N/A 01/27/2016   Procedure: Left Heart Cath and Coronary Angiography;  Surgeon: Belva Crome, MD;  Location: [place] [facility];  Service: Cardiovascular;  Laterality: N/A;  . COLONOSCOPY  2012   Dr Lajoyce Corners.   . COLONOSCOPY WITH PROPOFOL  N/A 07/07/2016   Procedure: COLONOSCOPY WITH PROPOFOL;  Surgeon: [name], MD;  [facility];  Service: Endoscopy;  Laterality: N/A;  . CORONARY ARTERY BYPASS GRAFT N/A 01/28/2016   Procedure: CORONARY ARTERY BYPASS GRAFTING (CABG) x 3 USING RIGHT LEG GREATER SAPHENOUS VEIN GRAFT;  Surgeon: Melrose Nakayama, MD;  Location: [place];  Service: Open Heart Surgery;  Laterality: N/A;  . ENDOVEIN HARVEST OF GREATER SAPHENOUS VEIN Right 01/28/2016   Procedure: ENDOVEIN HARVEST OF GREATER SAPHENOUS VEIN;  Surgeon: Melrose Nakayama, MD;  Location: [place];  Service: Open Heart Surgery;  Laterality: Right;  . ESOPHAGEAL BANDING  03/28/2019   Procedure: ESOPHAGEAL BANDING;  Surgeon: [name], MD;  [facility];  Service: Endoscopy;;  . ESOPHAGEAL BANDING  04/07/2019   Procedure: ESOPHAGEAL BANDING;  Surgeon: Juanita Craver, MD;  Location[facility] END[facility]COPY;  Service: Endoscopy;;  . ESOPHAGOGASTRODUODENOSCOPY N/A 04/07/2019   Procedure: ESOPHAGOGASTRODUODENOSCOPY (EGD);  Surgeon: Juanita Craver, MD;  Location: [facility] ENDOSCOPY;  Service: Endoscopy;  Laterality: N/A;  . ESOPHAGOGASTRODUODENOSCOPY (EGD) WITH PROPOFOL N/A 07/07/2016   Procedure: ESOPHAGOGASTRODUODENOSCOPY (EGD) WITH PROPOFOL;  Surgeon: [name], MD;  [facility];  Service: Endoscopy;  Laterality: N/A;  . ESOPHAGOGASTRODUODENOSCOPY (EGD) WITH PROPOFOL N/A 03/28/2019   Procedure: ESOPHAGOGASTRODUODENOSCOPY (EGD) WITH PROPOFOL;  Surgeon: [name], MD;  [facility];  Service: Endoscopy;  Laterality: N/A;  . HEMOSTASIS CLIP PLACEMENT  04/07/2019   Procedure: HEMOSTASIS CLIP PLACEMENT;  Surgeon: Juanita Craver, MD;  Location: [place] ENDOSCOPY;  Service: Endoscopy;;  . IR ANGIOGRAM SELECTIVE EACH ADDITIONAL VESSEL  04/08/2019  . IR EMBO ART  VEN HEMORR LYMPH EXTRAV  INC GUIDE ROADMAPPING  04/08/2019  . IR PARACENTESIS  04/08/2019  . IR RADIOLOGIST EVAL & MGMT  06/13/2019  . IR RADIOLOGIST EVAL & MGMT   09/25/2019  . IR RADIOLOGIST EVAL & MGMT  07/07/2020  . IR TIPS  04/08/2019  . MAXIMUM ACCESS (MAS)POSTERIOR LUMBAR INTERBODY FUSION (PLIF) 1 LEVEL Left 06/10/2015   Procedure: FOR MAXIMUM ACCESS (MAS) POSTERIOR LUMBAR INTERBODY FUSION (PLIF) LUMBAR THREE-FOUR EXTRAFORAMINAL MICRODISCECTOMY LUMBAR FIVE-SACRAL ONE LEFT;  Surgeon: Eustace Moore, MD;  Location: [place] [facility]URO ORS;  Service: Neurosurgery;  Laterality: Left;  . RADIOLOGY WITH ANESTHESIA N/A 04/08/2019   Procedure: RADIOLOGY WITH ANESTHESIA;  Surgeon: Radiologist, Medication, MD;  Location: [place];  Service: Radiology;  Laterality: N/A;  . SCLEROTHERAPY  04/07/2019   Procedure: SCLEROTHERAPY;  Surgeon: Juanita Craver, MD;  Location: [facility] ENDOSCOPY;  Service: Endoscopy;;  . TEE WITHOUT CARDIOVERSION N/A 01/28/2016   Procedure: TRANSESOPHAGEAL ECHOCARDIOGRAM (TEE);  Surgeon: Melrose Nakayama, MD;  Location: [place];  Service: Open Heart Surgery;  Laterality: N/A;  . TUBAL LIGATION  1982   Dr Connye Burkitt  . UMBILICAL HERNIA REPAIR N/A 09/10/2019   Procedure: HERNIA REPAIR UMBILICAL ADULT;  Surgeon: Donnie Mesa, MD;  Location: [place];  Service:  General;  Laterality: N/A;  . UPPER GASTROINTESTINAL ENDOSCOPY    . VAGINAL HYSTERECTOMY  1997   Dr Rande Lawman   Social History:   reports that she has never smoked. She has never used smokeless tobacco. She reports that she does not drink alcohol and does not use drugs.  Family History  Problem Relation Age of Onset  . Lung cancer Father   . Arthritis Sister   . Arthritis Brother   . Liver cancer Brother   . Heart disease Maternal Grandmother   . Heart disease Maternal Grandfather   . Heart disease Paternal Grandmother   . Heart disease Paternal Grandfather   . Breast cancer Mother   . Other Daughter        house fire  . Breast cancer Maternal Aunt   . Breast cancer Paternal Aunt   . Colon cancer Neg Hx   . Esophageal cancer Neg Hx   . Rectal cancer Neg Hx   . Stomach cancer Neg Hx      Medications: Patient's Medications  New Prescriptions   No medications on file  Previous Medications   ACETAMINOPHEN (TYLENOL) 500 MG TABLET    Take 500 mg by mouth at bedtime.   BIOTIN 05110 MCG TABS    Take 10,000 mcg by mouth every morning.   CYANOCOBALAMIN (VITAMIN B 12 PO)    Take 1,000 mcg by mouth daily.   EZETIMIBE (ZETIA) 10 MG TABLET    TAKE 1 TABLET BY MOUTH EVERY DAY   FUROSEMIDE (LASIX) 20 MG TABLET    Take 1 tablet (20 mg total) by mouth daily.   GLUCOSE BLOOD TEST STRIP    One Touch Ultra II strips. Use to test blood sugar three times daily. Dx: E11.65   INSULIN PEN NEEDLE (BD PEN NEEDLE NANO U/F) 32G X 4 MM MISC    USE THREE TIMES DAILY AS DIRECTED   INSULIN SYRINGE-NEEDLE U-100 (INSULIN SYRINGE 1CC/31GX5/16") 31G X 5/16" 1 ML MISC    USE AS DIRECTED TO INJECT LEVIMIR   JARDIANCE 25 MG TABS TABLET    TAKE 1 TABLET BY MOUTH DAILY   LACTULOSE (CHRONULAC) 10 GM/15ML SOLUTION    Take 45 mLs (30 g total) by mouth daily.   LEVEMIR FLEXTOUCH 100 UNIT/ML FLEXPEN    ADMINISTER 30 UNITS UNDER THE SKIN AT BEDTIME   MAGNESIUM PO    Take 500 mg by mouth daily.    METOPROLOL TARTRATE (LOPRESSOR) 25 MG TABLET    Take 1 tablet (25 mg total) by mouth as needed (use as needed for HR greater than 110).   MULTIPLE VITAMINS-MINERALS (MULTIVITAMIN WITH MINERALS) TABLET    Take 1 tablet by mouth daily.   OMEGA-3 FATTY ACIDS (FISH OIL PO)    Take 1 capsule by mouth daily.   PANTOPRAZOLE (PROTONIX) 40 MG TABLET    TAKE 1 TABLET(40 MG) BY MOUTH DAILY   POLYETHYL GLYCOL-PROPYL GLYCOL (SYSTANE OP)    Place 1 drop into both eyes daily.    PROBIOTIC PRODUCT (PROBIOTIC DAILY PO)    Take 1 capsule by mouth daily. [employer]age Probiotic   XIFAXAN 550 MG TABS TABLET    Take 1 tablet (550 mg total) by mouth 2 (two) times daily.  Modified Medications   No medications on file  Discontinued Medications   No medications on file    Physical Exam:  Vitals:   10/14/20 0912  BP: (!) 126/58   Pulse: 70  Temp: (!) 97.5 F (36.4 C)  TempSrc: Temporal  SpO2: 98%  Weight: 131 lb 6.4 oz (59.6 kg)   Body mass index is 23.28 kg/m. Wt Readings from Last 3 Encounters:  10/14/20 131 lb 6.4 oz (59.6 kg)  08/26/20 132 lb 3.2 oz (60 kg)  06/15/20 134 lb (60.8 kg)    Physical Exam Vitals and nursing note reviewed.  Constitutional:      Appearance: Normal appearance.  Cardiovascular:     Rate and Rhythm: Normal rate and regular rhythm.     Heart sounds: Murmur heard.    Pulmonary:     Effort: Pulmonary effort is normal.     Breath sounds: Normal breath sounds.  Skin:    Findings: Lesion present.     Comments: Area on lower leg probably inflamed keratosis but unsure.  Will refer to dermatology for further eval  Neurological:     General: No focal deficit present.     Mental Status: She is alert and oriented to person, place, and time.     Labs reviewed: Basic Metabolic Panel: Recent Labs    05/12/20 0948 06/10/20 0826 10/13/20 0843  NA 139 141 143  K 3.5 3.9 3.7  CL 104 107 107  CO2 26 27 26   GLUCOSE 220* 112* 93  BUN 15 14 16   CREATININE 0.80 0.76 0.79  CALCIUM 9.5 9.4 10.0   Liver Function Tests: Recent Labs    11/08/19 1030 02/03/20 1009 05/12/20 0948 06/10/20 0826 10/13/20 0843  AST 55*   < > 47* 42* 52*  ALT 36*   < > 35 29 39*  ALKPHOS 149*  --  98  --   --   BILITOT 1.8*   < > 1.6* 1.3* 1.4*  PROT 6.9   < > 6.8 6.2 6.3  ALBUMIN 3.4*  --  3.4*  --   --    < > = values in this interval not displayed.   No results for input(s): LIPASE, AMYLASE in the last 8760 hours. No results for input(s): AMMONIA in the last 8760 hours. CBC: Recent Labs    04/10/20 1145 05/12/20 0948 06/10/20 0826 10/13/20 0843  WBC 4.2 3.9* 3.2* 2.8*  NEUTROABS 2.7  --  1,690 1,481*  HGB 13.3 13.5 13.3 13.5  HCT 41.3 40.7 38.0 40.8  MCV 95.8 93.0 91.3 91.9  PLT 70* 98.0* 92* 82*   Lipid Panel: Recent Labs    02/03/20 1009  CHOL 161  HDL 76  LDLCALC 69   TRIG 77  CHOLHDL 2.1   TSH: No results for input(s): TSH in the last 8760 hours. A1C: Lab Results  Component Value Date   HGBA1C 9.5 (H) 10/13/2020     Assessment/Plan  1. Hair loss- TSH Hair is definitely thinning.  She is concerned because she has family history of hypothyroid 2. Neutropenia, unspecified type ([place]) White count as well as platelet counts have been depressed over the preceding 6 months.  Her red blood cells are normal.  Have concerns that there may be some bone marrow issue affecting production of the cells - Ambulatory referral to Hematology / Oncology  3. Rosacea Consider treatment with metronidazole gel but she request dermatology - Ambulatory referral to Dermatology  4. Essential hypertension Blood pressure is good on no antihypertensive  5. Esophageal varices without bleeding, unspecified esophageal varices type ([place]) Stable and followed by gastroenterology   Alain Honey, MD [place] [phone]

## 2020-10-14 NOTE — Patient Instructions (Signed)
Adjust dose insulin as we discussed

## 2020-10-15 ENCOUNTER — Telehealth: Payer: Self-pay | Admitting: Hematology

## 2020-10-15 LAB — COMPLETE METABOLIC PANEL WITH GFR
AG Ratio: 1.2 (calc) (ref 1.0–2.5)
ALT: 39 U/L — ABNORMAL HIGH (ref 6–29)
AST: 52 U/L — ABNORMAL HIGH (ref 10–35)
Albumin: 3.4 g/dL — ABNORMAL LOW (ref 3.6–5.1)
Alkaline phosphatase (APISO): 104 U/L (ref 37–153)
BUN: 16 mg/dL (ref 7–25)
CO2: 26 mmol/L (ref 20–32)
Calcium: 10 mg/dL (ref 8.6–10.4)
Chloride: 107 mmol/L (ref 98–110)
Creat: 0.79 mg/dL (ref 0.60–0.93)
GFR, Est African American: 86 mL/min/{1.73_m2} (ref >=60)
GFR, Est Non African American: 74 mL/min/{1.73_m2} (ref >=60)
Globulin: 2.9 g/dL (calc) (ref 1.9–3.7)
Glucose, Bld: 93 mg/dL (ref 65–99)
Potassium: 3.7 mmol/L (ref 3.5–5.3)
Sodium: 143 mmol/L (ref 135–146)
Total Bilirubin: 1.4 mg/dL — ABNORMAL HIGH (ref 0.2–1.2)
Total Protein: 6.3 g/dL (ref 6.1–8.1)

## 2020-10-15 LAB — CBC WITH DIFFERENTIAL/PLATELET
Absolute Monocytes: 392 cells/uL (ref 200–950)
Basophils Absolute: 31 cells/uL (ref 0–200)
Basophils Relative: 1.1 %
Eosinophils Absolute: 81 cells/uL (ref 15–500)
Eosinophils Relative: 2.9 %
HCT: 40.8 % (ref 35.0–45.0)
Hemoglobin: 13.5 g/dL (ref 11.7–15.5)
Lymphs Abs: 815 cells/uL — ABNORMAL LOW (ref 850–3900)
MCH: 30.4 pg (ref 27.0–33.0)
MCHC: 33.1 g/dL (ref 32.0–36.0)
MCV: 91.9 fL (ref 80.0–100.0)
MPV: 12.3 fL (ref 7.5–12.5)
Monocytes Relative: 14 %
Neutro Abs: 1481 cells/uL — ABNORMAL LOW (ref 1500–7800)
Neutrophils Relative %: 52.9 %
Platelets: 82 10*3/uL — ABNORMAL LOW (ref 140–400)
RBC: 4.44 10*6/uL (ref 3.80–5.10)
RDW: 14.2 % (ref 11.0–15.0)
Total Lymphocyte: 29.1 %
WBC: 2.8 10*3/uL — ABNORMAL LOW (ref 3.8–10.8)

## 2020-10-15 LAB — TEST AUTHORIZATION 2

## 2020-10-15 LAB — HEMOGLOBIN A1C
Hgb A1c MFr Bld: 9.5 % of total Hgb — ABNORMAL HIGH (ref <5.7)
Mean Plasma Glucose: 226 mg/dL
eAG (mmol/L): 12.5 mmol/L

## 2020-10-15 LAB — TSH: TSH: 2.78 mIU/L (ref 0.40–4.50)

## 2020-10-15 NOTE — Telephone Encounter (Signed)
Scheduled appt per 6/8 referral. Pt aware.

## 2020-11-05 ENCOUNTER — Other Ambulatory Visit: Payer: Self-pay

## 2020-11-05 ENCOUNTER — Encounter: Payer: Self-pay | Admitting: Hematology

## 2020-11-05 ENCOUNTER — Inpatient Hospital Stay: Payer: Medicare Other | Attending: Hematology | Admitting: Hematology

## 2020-11-05 VITALS — BP 134/59 | HR 75 | Temp 97.6°F | Resp 18 | Ht 63.0 in | Wt 126.5 lb

## 2020-11-05 DIAGNOSIS — Z801 Family history of malignant neoplasm of trachea, bronchus and lung: Secondary | ICD-10-CM | POA: Insufficient documentation

## 2020-11-05 DIAGNOSIS — M1990 Unspecified osteoarthritis, unspecified site: Secondary | ICD-10-CM | POA: Insufficient documentation

## 2020-11-05 DIAGNOSIS — D72810 Lymphocytopenia: Secondary | ICD-10-CM | POA: Insufficient documentation

## 2020-11-05 DIAGNOSIS — I509 Heart failure, unspecified: Secondary | ICD-10-CM | POA: Diagnosis not present

## 2020-11-05 DIAGNOSIS — D696 Thrombocytopenia, unspecified: Secondary | ICD-10-CM | POA: Diagnosis not present

## 2020-11-05 DIAGNOSIS — I130 Hypertensive heart and chronic kidney disease with heart failure and stage 1 through stage 4 chronic kidney disease, or unspecified chronic kidney disease: Secondary | ICD-10-CM | POA: Diagnosis not present

## 2020-11-05 DIAGNOSIS — I2510 Atherosclerotic heart disease of native coronary artery without angina pectoris: Secondary | ICD-10-CM | POA: Diagnosis not present

## 2020-11-05 DIAGNOSIS — Z79899 Other long term (current) drug therapy: Secondary | ICD-10-CM | POA: Diagnosis not present

## 2020-11-05 DIAGNOSIS — Z887 Allergy status to serum and vaccine status: Secondary | ICD-10-CM | POA: Insufficient documentation

## 2020-11-05 DIAGNOSIS — K7460 Unspecified cirrhosis of liver: Secondary | ICD-10-CM | POA: Diagnosis not present

## 2020-11-05 DIAGNOSIS — Z87440 Personal history of urinary (tract) infections: Secondary | ICD-10-CM | POA: Insufficient documentation

## 2020-11-05 DIAGNOSIS — Z885 Allergy status to narcotic agent status: Secondary | ICD-10-CM | POA: Insufficient documentation

## 2020-11-05 DIAGNOSIS — R188 Other ascites: Secondary | ICD-10-CM | POA: Diagnosis not present

## 2020-11-05 DIAGNOSIS — Z888 Allergy status to other drugs, medicaments and biological substances status: Secondary | ICD-10-CM | POA: Insufficient documentation

## 2020-11-05 DIAGNOSIS — N181 Chronic kidney disease, stage 1: Secondary | ICD-10-CM | POA: Diagnosis not present

## 2020-11-05 DIAGNOSIS — R161 Splenomegaly, not elsewhere classified: Secondary | ICD-10-CM | POA: Insufficient documentation

## 2020-11-05 DIAGNOSIS — Z803 Family history of malignant neoplasm of breast: Secondary | ICD-10-CM | POA: Insufficient documentation

## 2020-11-05 DIAGNOSIS — K760 Fatty (change of) liver, not elsewhere classified: Secondary | ICD-10-CM | POA: Diagnosis not present

## 2020-11-05 DIAGNOSIS — E7800 Pure hypercholesterolemia, unspecified: Secondary | ICD-10-CM | POA: Insufficient documentation

## 2020-11-05 DIAGNOSIS — Z8616 Personal history of COVID-19: Secondary | ICD-10-CM | POA: Diagnosis not present

## 2020-11-05 DIAGNOSIS — Z800 Family history of malignant neoplasm of digestive organs: Secondary | ICD-10-CM | POA: Insufficient documentation

## 2020-11-05 DIAGNOSIS — E119 Type 2 diabetes mellitus without complications: Secondary | ICD-10-CM | POA: Insufficient documentation

## 2020-11-05 DIAGNOSIS — Z8249 Family history of ischemic heart disease and other diseases of the circulatory system: Secondary | ICD-10-CM | POA: Diagnosis not present

## 2020-11-05 DIAGNOSIS — Z8261 Family history of arthritis: Secondary | ICD-10-CM | POA: Insufficient documentation

## 2020-11-05 DIAGNOSIS — I252 Old myocardial infarction: Secondary | ICD-10-CM | POA: Diagnosis not present

## 2020-11-05 NOTE — Progress Notes (Signed)
[place]   [phone] Fax:(336) [place] Note   Patient Care Team: [name], MD as PCP - General (Family Medicine) Josue Hector, MD as PCP - Cardiology (Cardiology) Berle Mull, MD as Consulting Physician (Family Medicine) Sharmon Revere as Physician Assistant (Cardiology) Calvert Cantor, MD as Consulting Physician (Ophthalmology) [name], MD as Attending Physician (Gastroenterology) [name], RN as [place] [facility]  Date of Service:  11/05/2020   CHIEF COMPLAINTS/PURPOSE OF CONSULTATION:  Neutropenia and thrombocytopenia   REFERRING PHYSICIAN:  Dr. Alain Honey  HISTORY OF PRESENTING ILLNESS:  [name] 75 y.o. female is a here because of neutropenia. The patient was referred by her PCP, Dr. Sabra Heck. The patient presents to the clinic today alone. I last saw her on 04/21/17 for portal vein thrombosis.  Patient was diagnosed with liver cirrhosis and ascites in later 2017.  She has been followed by Dr. [name] for liver cirrhosis management, and liver cancer screening.  She has been doing well overall, no recurrent ascites, her previous EGD did not show large varices, no history of GI bleeding.   According to Epic records, she had normal CBC in 2009, and was noticed to have mild thrombocytopenia since 2016 (no CBC between 2009 and 2016).  Platelet has been around 100K, overall stable over the past 6 years.  No significant bleeding or easy bruising.  She was noticed to have mild leukopenia in the past few years, with WBC 2.8-3.9, ANC has been normal most time, and her absolute lymphocytes has been slightly low.  No frequent infection.   She is followed by geriatric medicine about every 2 months. She has been notes to have a decreasing white count, as well as platelet counts, over the last 6 months. Dr. Sabra Heck noted "concerns that there may be some bone marrow issue  affecting production of the cells"  She reports being very thirsty. She also notes she is tired all the time. She is able to take care of herself and do housework, including some gardening. She reports a weight loss of about 5 lbs, unintentionally.    She has a PMHx of -open heart surgery for CABG in 01/2016 -significant comorbidities of liver cirrhosis from fatty liver. She has been followed by Dr. [name] since at least 2018. Her most recent liver MRI was 08/2020 -CAD with a history of CHF. She denies CHF today. -hypercholesterolemia and uncontrolled type 2 DM.  -arthritis -hysterectomy -CKD and A-fib are noted in her chart, but she says these are resolved.    Socially... 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 She is married with 6 children and lives with her husband. Her mother and 3 aunt had breast cancer. Her father had lung cancer. There is no family history of blood disorders to her knowledge.   All other systems were reviewed with the patient and are negative.   MEDICAL HISTORY:  Past Medical History:  Diagnosis Date   A-fib ([place])    08/19/19 afib with RVR, spontaneously converted to SR   Allergy    Arthritis    neck   Cataract    bilateral - MD monitoring cataracts   CHF (congestive heart failure) (HCC)    Chronic kidney disease, stage I    DR OTTELIN  HX UTIS   Cirrhosis ([place])    Coronary artery disease    s/p CABG 01/29/16: LIMA-LAD, SVG-RI, SVG-dRCA  COVID-19 virus infection 04/2020   Cramp of limb    Diabetes mellitus    Dysphagia, unspecified(787.20)    Dysuria    Epistaxis    GERD (gastroesophageal reflux disease)    Hearing loss    wears hearing aids   Heart murmur     DX FOR YEARS ASYMPTOMATIC   Hepatic encephalopathy (HCC)    Lumbago    Myocardial infarction ([place])    NSTEMI 01/27/16   Neoplasm of uncertain behavior of skin    Nonspecific elevation of levels of transaminase or lactic acid dehydrogenase (LDH)     Osteoarthrosis, unspecified whether generalized or localized, unspecified site    Other and unspecified hyperlipidemia    diet controlled   Pain in joint, shoulder region    Paresthesias 04/01/2015   Postablative ovarian failure    Trochanteric bursitis of left hip 12/15/2015   Type 2 diabetes mellitus without complication ([place])    Unspecified essential hypertension    no meds   Wears glasses     SURGICAL HISTORY: Past Surgical History:  Procedure Laterality Date   BACK SURGERY  06/2015   BREAST BIOPSY Left    CARDIAC CATHETERIZATION N/A 01/27/2016   Procedure: Left Heart Cath and Coronary Angiography;  Surgeon: Belva Crome, MD;  Location: [facility];  Service: Cardiovascular;  Laterality: N/A;   COLONOSCOPY  2012   Dr Lajoyce Corners.    COLONOSCOPY WITH PROPOFOL N/A 07/07/2016   Procedure: COLONOSCOPY WITH PROPOFOL;  Surgeon: [name], MD;  Location: [facility];  Service: Endoscopy;  Laterality: N/A;   CORONARY ARTERY BYPASS GRAFT N/A 01/28/2016   Procedure: CORONARY ARTERY BYPASS GRAFTING (CABG) x 3 USING RIGHT LEG GREATER SAPHENOUS VEIN GRAFT;  Surgeon: Melrose Nakayama, MD;  Location: [place];  Service: Open Heart Surgery;  Laterality: N/A;   ENDOVEIN HARVEST OF GREATER SAPHENOUS VEIN Right 01/28/2016   Procedure: ENDOVEIN HARVEST OF GREATER SAPHENOUS VEIN;  Surgeon: Melrose Nakayama, MD;  Location: [place];  Service: Open Heart Surgery;  Laterality: Right;   ESOPHAGEAL BANDING  03/28/2019   Procedure: ESOPHAGEAL BANDING;  Surgeon: [name], MD;  Location: [facility];  Service: Endoscopy;;   ESOPHAGEAL BANDING  04/07/2019   Procedure: ESOPHAGEAL BANDING;  Surgeon: Juanita Craver, MD;  Location[facility];  Service: Endoscopy;;   ESOPHAGOGASTRODUODENOSCOPY N/A 04/07/2019   Procedure: ESOPHAGOGASTRODUODENOSCOPY (EGD);  Surgeon: Juanita Craver, MD;  Location: [facility] ENDOSCOPY;  Service: Endoscopy;  Laterality: N/A;   ESOPHAGOGASTRODUODENOSCOPY (EGD) WITH PROPOFOL N/A  07/07/2016   Procedure: ESOPHAGOGASTRODUODENOSCOPY (EGD) WITH PROPOFOL;  Surgeon: [name], MD;  Location: [facility];  Service: Endoscopy;  Laterality: N/A;   ESOPHAGOGASTRODUODENOSCOPY (EGD) WITH PROPOFOL N/A 03/28/2019   Procedure: ESOPHAGOGASTRODUODENOSCOPY (EGD) WITH PROPOFOL;  Surgeon: [name], MD;  Location: [facility];  Service: Endoscopy;  Laterality: N/A;   HEMOSTASIS CLIP PLACEMENT  04/07/2019   Procedure: HEMOSTASIS CLIP PLACEMENT;  Surgeon: Juanita Craver, MD;  Location: [facility];  Service: Endoscopy;;   IR ANGIOGRAM SELECTIVE EACH ADDITIONAL VESSEL  04/08/2019   IR EMBO ART  VEN HEMORR LYMPH EXTRAV  INC GUIDE ROADMAPPING  04/08/2019   IR PARACENTESIS  04/08/2019   IR RADIOLOGIST EVAL & MGMT  06/13/2019   IR RADIOLOGIST EVAL & MGMT  09/25/2019   IR RADIOLOGIST EVAL & MGMT  07/07/2020   IR TIPS  04/08/2019   MAXIMUM ACCESS (MAS)POSTERIOR LUMBAR INTERBODY FUSION (PLIF) 1 LEVEL Left 06/10/2015   Procedure: FOR MAXIMUM ACCESS (MAS) POSTERIOR LUMBAR INTERBODY FUSION (PLIF)  LUMBAR THREE-FOUR EXTRAFORAMINAL MICRODISCECTOMY LUMBAR FIVE-SACRAL ONE LEFT;  Surgeon: Eustace Moore, MD;  Location: [facility];  Service: Neurosurgery;  Laterality: Left;   RADIOLOGY WITH ANESTHESIA N/A 04/08/2019   Procedure: RADIOLOGY WITH ANESTHESIA;  Surgeon: Radiologist, Medication, MD;  Location: [place];  Service: Radiology;  Laterality: N/A;   SCLEROTHERAPY  04/07/2019   Procedure: SCLEROTHERAPY;  Surgeon: Juanita Craver, MD;  Location: [facility] ENDOSCOPY;  Service: Endoscopy;;   TEE WITHOUT CARDIOVERSION N/A 01/28/2016   Procedure: TRANSESOPHAGEAL ECHOCARDIOGRAM (TEE);  Surgeon: Melrose Nakayama, MD;  Location: [place];  Service: Open Heart Surgery;  Laterality: N/A;   TUBAL LIGATION  1982   Dr Lynwood Dawley HERNIA REPAIR N/A 09/10/2019   Procedure: HERNIA REPAIR UMBILICAL ADULT;  Surgeon: Donnie Mesa, MD;  Location: [place];  Service: General;  Laterality: N/A;   UPPER GASTROINTESTINAL  ENDOSCOPY     VAGINAL HYSTERECTOMY  1997   Dr Rande Lawman    SOCIAL HISTORY: Social History   Socioeconomic History   Marital status: Married    Spouse name: Not on file   Number of children: 6   Years of education: Not on file   Highest education level: Not on file  Occupational History   Occupation: retired  Tobacco Use   Smoking status: Never   Smokeless tobacco: Never  Vaping Use   Vaping Use: Never used  Substance and Sexual Activity   Alcohol use: No   Drug use: No   Sexual activity: Yes    Partners: Male    Birth control/protection: Post-menopausal, Surgical    Comment: Hysterectomy  Other Topics Concern   Not on file  Social History Narrative   She is retired she is married and lives with her husband   6 children   No alcohol or tobacco never smoker no drug use   Social Determinants of Radio broadcast assistant Strain: Not on file  Food Insecurity: Not on file  Transportation Needs: Not on file  Physical Activity: Not on file  Stress: Not on file  Social Connections: Not on file  Intimate Partner Violence: Not on file    FAMILY HISTORY: Family History  Problem Relation Age of Onset   Heart disease Mother    Breast cancer Mother    Lung cancer Father    Arthritis Sister    Arthritis Brother    Liver cancer Brother    Breast cancer Maternal Aunt    Breast cancer Paternal Aunt    Heart disease Maternal Grandmother    Heart disease Maternal Grandfather    Heart disease Paternal Grandmother    Heart disease Paternal Grandfather    Other Daughter        house fire   Colon cancer Neg Hx    Esophageal cancer Neg Hx    Rectal cancer Neg Hx    Stomach cancer Neg Hx     ALLERGIES:  is allergic to kiwi extract, tdap [tetanus-diphth-acell pertussis], statins, latex, and tramadol.  MEDICATIONS:  Current Outpatient Medications  Medication Sig Dispense Refill   acetaminophen (TYLENOL) 500 MG tablet Take 500 mg by mouth at bedtime.     Biotin 10000 MCG  TABS Take 10,000 mcg by mouth every morning.     Cyanocobalamin (VITAMIN B 12 PO) Take 1,000 mcg by mouth daily.     ezetimibe (ZETIA) 10 MG tablet TAKE 1 TABLET BY MOUTH EVERY DAY 90 tablet 1   furosemide (LASIX) 20 MG tablet Take 1 tablet (20 mg total)  by mouth daily. 90 tablet 3   glucose blood test strip One Touch Ultra II strips. Use to test blood sugar three times daily. Dx: E11.65 300 each 3   Insulin Pen Needle (BD PEN NEEDLE NANO U/F) 32G X 4 MM MISC USE THREE TIMES DAILY AS DIRECTED 100 each 6   Insulin Syringe-Needle U-100 (INSULIN SYRINGE 1CC/31GX5/16") 31G X 5/16" 1 ML MISC USE AS DIRECTED TO INJECT LEVIMIR 100 each 2   JARDIANCE 25 MG TABS tablet TAKE 1 TABLET BY MOUTH DAILY 90 tablet 1   lactulose (CHRONULAC) 10 GM/15ML solution Take 45 mLs (30 g total) by mouth daily. 4050 mL 0   LEVEMIR FLEXTOUCH 100 UNIT/ML FlexPen ADMINISTER 30 UNITS UNDER THE SKIN AT BEDTIME 15 mL 3   MAGNESIUM PO Take 500 mg by mouth daily.      metoprolol tartrate (LOPRESSOR) 25 MG tablet Take 1 tablet (25 mg total) by mouth as needed (use as needed for HR greater than 110). 60 tablet 2   Multiple Vitamins-Minerals (MULTIVITAMIN WITH MINERALS) tablet Take 1 tablet by mouth daily.     Omega-3 Fatty Acids (FISH OIL PO) Take 1 capsule by mouth daily.     pantoprazole (PROTONIX) 40 MG tablet TAKE 1 TABLET(40 MG) BY MOUTH DAILY 90 tablet 1   Polyethyl Glycol-Propyl Glycol (SYSTANE OP) Place 1 drop into both eyes daily.      Probiotic Product (PROBIOTIC DAILY PO) Take 1 capsule by mouth daily. Digestive Advantage Probiotic     XIFAXAN 550 MG TABS tablet Take 1 tablet (550 mg total) by mouth 2 (two) times daily. 180 tablet 3   No current facility-administered medications for this visit.    PHYSICAL EXAMINATION: ECOG PERFORMANCE STATUS: 1 - Symptomatic but completely ambulatory  Vitals:   11/05/20 1155  BP: (!) 134/59  Pulse: 75  Resp: 18  Temp: 97.6 F (36.4 C)  SpO2: 98%   Filed Weights   11/05/20  1155  Weight: 126 lb 8 oz (57.4 kg)    GENERAL:alert, no distress and comfortable SKIN: skin color, texture, turgor are normal, no rashes or significant lesions EYES: normal, Conjunctiva are pink and non-injected, sclera clear  NECK: supple, thyroid normal size, non-tender, without nodularity LYMPH:  no palpable lymphadenopathy in the cervical, axillary  LUNGS: clear to auscultation and percussion with normal breathing effort HEART: regular rate & rhythm and no murmurs and no lower extremity edema ABDOMEN:abdomen soft, non-tender and normal bowel sounds Musculoskeletal:no cyanosis of digits and no clubbing  NEURO: alert & oriented x 3 with fluent speech, no focal motor/sensory deficits  LABORATORY DATA:  I have reviewed the data as listed CBC Latest Ref Rng & Units 10/13/2020 06/10/2020 05/12/2020  WBC 3.8 - 10.8 Thousand/uL 2.8(L) 3.2(L) 3.9(L)  Hemoglobin 11.7 - 15.5 g/dL 13.5 13.3 13.5  Hematocrit 35.0 - 45.0 % 40.8 38.0 40.7  Platelets 140 - 400 Thousand/uL 82(L) 92(L) 98.0(L)    CMP Latest Ref Rng & Units 10/13/2020 06/10/2020 05/12/2020  Glucose 65 - 99 mg/dL 93 112(H) 220(H)  BUN 7 - 25 mg/dL _0 Creatinine 0.60 - 0.93 mg/dL 0.79 0.76 0.80  Sodium 135 - 146 mmol/L 143 141 139  Potassium 3.5 - 5.3 mmol/L 3.7 3.9 3.5  Chloride 98 - 110 mmol/L 107 107 104  CO2 20 - 32 mmol/L _1 Calcium 8.6 - 10.4 mg/dL 10.0 9.4 9.5  Total Protein 6.1 - 8.1 g/dL 6.3 6.2 6.8  Total Bilirubin 0.2 - 1.2 mg/dL 1.4(H)  1.3(H) 1.6(H)  Alkaline Phos 39 - 117 U/L - - 98  AST 10 - 35 U/L 52(H) 42(H) 47(H)  ALT 6 - 29 U/L 39(H) 29 35     RADIOGRAPHIC STUDIES: I have personally reviewed the radiological images as listed and agreed with the findings in the report. No results found.  ASSESSMENT & PLAN:  Amirra Herling is a 75 y.o. Caucasian female with a history of CAD, type 2 DM, arthritis, CABG  Leukopenia/lymphopenia, likely secondary to liver cirrhosis -She is monitored by her PCP,  Dr. Sabra Heck, and was found to have decreasing white count over last year -She has no history of liver cirrhosis, splenomegaly.  Her lymphopenia is likely related to her liver disease. -She has no frequent infection, no indication for treatment -Overall mild, and stable, will continue monitoring. -I do not think she needs a bone marrow biopsy, primary bone marrow disease such as MDS, lymphoma, leukemia is unlikely.  2.  Mild thrombocytopenia, secondary to liver cirrhosis -She has mild thrombocytopenia in the past 6 years or longer, platelet around 100, overall stable and mild, no clinical bleeding or easy bruising. -This is likely related to her liver cirrhosis.  I discussed the other etiology, such as folate or B12 deficiency, chronic hepatitis or HIV infection, alcohol related, which were all checked in the past and were WNL or negative  -ITP is also a possibility, not able to rule out.  However giving her mild thrombocytopenia, she would not require any treatment. -Primary bone marrow disease, such as MDS, lymphoma or leukemia much less likely, I did not recommend bone marrow biopsy at this point. -If her platelet counts drops below 50, and she needs invasive surgery, I would offer Doptelet to improve her thrombocytopenia before surgery.  2.  Liver cirrhosis and splenomegaly -Follow-up with Dr. [name] -She is on liver cancer screening with ultrasound and AFP every 6 months  PLAN:  -Her cytopenias are likely secondary to liver cirrhosis  -we will schedule a f/u in one year    No orders of the defined types were placed in this encounter.   All questions were answered. The patient knows to call the clinic with any problems, questions or concerns. The total time spent in the appointment was 40 minutes.     Truitt Merle, MD 11/05/2020 6:03 PM   I, Wilburn Mylar, am acting as scribe for Truitt Merle, MD.   I have reviewed the above documentation for accuracy and completeness, and I agree  with the above.

## 2020-11-19 DIAGNOSIS — M542 Cervicalgia: Secondary | ICD-10-CM | POA: Diagnosis not present

## 2020-11-19 DIAGNOSIS — M5416 Radiculopathy, lumbar region: Secondary | ICD-10-CM | POA: Diagnosis not present

## 2020-11-23 ENCOUNTER — Inpatient Hospital Stay: Admission: RE | Admit: 2020-11-23 | Payer: Medicare Other | Source: Ambulatory Visit

## 2020-11-26 DIAGNOSIS — M542 Cervicalgia: Secondary | ICD-10-CM | POA: Diagnosis not present

## 2020-11-27 DIAGNOSIS — M542 Cervicalgia: Secondary | ICD-10-CM | POA: Diagnosis not present

## 2020-11-27 DIAGNOSIS — M4004 Postural kyphosis, thoracic region: Secondary | ICD-10-CM | POA: Diagnosis not present

## 2020-11-27 DIAGNOSIS — M1990 Unspecified osteoarthritis, unspecified site: Secondary | ICD-10-CM | POA: Diagnosis not present

## 2020-11-27 DIAGNOSIS — E089 Diabetes mellitus due to underlying condition without complications: Secondary | ICD-10-CM | POA: Diagnosis not present

## 2020-11-27 DIAGNOSIS — M7912 Myalgia of auxiliary muscles, head and neck: Secondary | ICD-10-CM | POA: Diagnosis not present

## 2020-11-27 DIAGNOSIS — M2560 Stiffness of unspecified joint, not elsewhere classified: Secondary | ICD-10-CM | POA: Diagnosis not present

## 2020-11-30 DIAGNOSIS — M542 Cervicalgia: Secondary | ICD-10-CM | POA: Diagnosis not present

## 2020-11-30 DIAGNOSIS — M2560 Stiffness of unspecified joint, not elsewhere classified: Secondary | ICD-10-CM | POA: Diagnosis not present

## 2020-11-30 DIAGNOSIS — M4004 Postural kyphosis, thoracic region: Secondary | ICD-10-CM | POA: Diagnosis not present

## 2020-11-30 DIAGNOSIS — E089 Diabetes mellitus due to underlying condition without complications: Secondary | ICD-10-CM | POA: Diagnosis not present

## 2020-11-30 DIAGNOSIS — M1990 Unspecified osteoarthritis, unspecified site: Secondary | ICD-10-CM | POA: Diagnosis not present

## 2020-11-30 DIAGNOSIS — M7912 Myalgia of auxiliary muscles, head and neck: Secondary | ICD-10-CM | POA: Diagnosis not present

## 2020-12-03 ENCOUNTER — Other Ambulatory Visit: Payer: Self-pay | Admitting: *Deleted

## 2020-12-03 MED ORDER — GLUCOSE BLOOD VI STRP: ORAL_STRIP | 3 refills | Status: DC

## 2020-12-03 NOTE — Telephone Encounter (Signed)
Patient requested refill

## 2020-12-08 ENCOUNTER — Telehealth: Payer: Self-pay | Admitting: *Deleted

## 2020-12-08 DIAGNOSIS — M1990 Unspecified osteoarthritis, unspecified site: Secondary | ICD-10-CM | POA: Diagnosis not present

## 2020-12-08 DIAGNOSIS — M542 Cervicalgia: Secondary | ICD-10-CM | POA: Diagnosis not present

## 2020-12-08 DIAGNOSIS — M4004 Postural kyphosis, thoracic region: Secondary | ICD-10-CM | POA: Diagnosis not present

## 2020-12-08 DIAGNOSIS — M7912 Myalgia of auxiliary muscles, head and neck: Secondary | ICD-10-CM | POA: Diagnosis not present

## 2020-12-08 DIAGNOSIS — E089 Diabetes mellitus due to underlying condition without complications: Secondary | ICD-10-CM | POA: Diagnosis not present

## 2020-12-08 DIAGNOSIS — M2560 Stiffness of unspecified joint, not elsewhere classified: Secondary | ICD-10-CM | POA: Diagnosis not present

## 2020-12-08 NOTE — Telephone Encounter (Signed)
Received Diabetic detailed Written Order from [facility] out and placed in Dr. Ammie Ferrier folder to review and sign.  To be faxed back to [facility] Fax: [phone] once completed.

## 2020-12-09 DIAGNOSIS — M5416 Radiculopathy, lumbar region: Secondary | ICD-10-CM | POA: Diagnosis not present

## 2020-12-10 ENCOUNTER — Other Ambulatory Visit: Payer: Medicare Other

## 2020-12-14 ENCOUNTER — Telehealth: Payer: Self-pay

## 2020-12-14 DIAGNOSIS — M1990 Unspecified osteoarthritis, unspecified site: Secondary | ICD-10-CM | POA: Diagnosis not present

## 2020-12-14 DIAGNOSIS — M7912 Myalgia of auxiliary muscles, head and neck: Secondary | ICD-10-CM | POA: Diagnosis not present

## 2020-12-14 DIAGNOSIS — M2560 Stiffness of unspecified joint, not elsewhere classified: Secondary | ICD-10-CM | POA: Diagnosis not present

## 2020-12-14 DIAGNOSIS — M4004 Postural kyphosis, thoracic region: Secondary | ICD-10-CM | POA: Diagnosis not present

## 2020-12-14 DIAGNOSIS — E089 Diabetes mellitus due to underlying condition without complications: Secondary | ICD-10-CM | POA: Diagnosis not present

## 2020-12-14 DIAGNOSIS — M542 Cervicalgia: Secondary | ICD-10-CM | POA: Diagnosis not present

## 2020-12-14 MED ORDER — PANTOPRAZOLE SODIUM 40 MG PO TBEC: DELAYED_RELEASE_TABLET | ORAL | 1 refills | Status: DC

## 2020-12-14 NOTE — Telephone Encounter (Signed)
Refill request received from [facility]

## 2020-12-14 NOTE — Telephone Encounter (Signed)
Patient called about form [facility] faxed over for Dr. Sabra Heck to sign and fax back so she can get her diabetic test strips. Patient was advised that the form was completed and placed and Dr. Sabra Heck box for signature and he will be in the office tomorrow,12/15/2020 and I will have him sign the form.

## 2020-12-15 ENCOUNTER — Other Ambulatory Visit: Payer: Medicare Other

## 2020-12-15 ENCOUNTER — Other Ambulatory Visit: Payer: Self-pay

## 2020-12-15 DIAGNOSIS — I2510 Atherosclerotic heart disease of native coronary artery without angina pectoris: Secondary | ICD-10-CM

## 2020-12-15 DIAGNOSIS — I4891 Unspecified atrial fibrillation: Secondary | ICD-10-CM | POA: Diagnosis not present

## 2020-12-15 DIAGNOSIS — E785 Hyperlipidemia, unspecified: Secondary | ICD-10-CM | POA: Diagnosis not present

## 2020-12-15 DIAGNOSIS — I509 Heart failure, unspecified: Secondary | ICD-10-CM | POA: Diagnosis not present

## 2020-12-15 LAB — LIPID PANEL
Chol/HDL Ratio: 2 ratio (ref 0.0–4.4)
Cholesterol, Total: 168 mg/dL (ref 100–199)
HDL: 85 mg/dL (ref >39)
LDL Chol Calc (NIH): 70 mg/dL (ref 0–99)
Triglycerides: 71 mg/dL (ref 0–149)
VLDL Cholesterol Cal: 13 mg/dL (ref 5–40)

## 2020-12-15 NOTE — Telephone Encounter (Signed)
Form faxed back to [facility]

## 2020-12-16 ENCOUNTER — Telehealth: Payer: Self-pay | Admitting: Family Medicine

## 2020-12-16 ENCOUNTER — Encounter: Payer: Self-pay | Admitting: Gastroenterology

## 2020-12-16 ENCOUNTER — Ambulatory Visit (INDEPENDENT_AMBULATORY_CARE_PROVIDER_SITE_OTHER): Payer: Medicare Other | Admitting: Gastroenterology

## 2020-12-16 VITALS — BP 110/60 | HR 65 | Ht 63.0 in | Wt 127.0 lb

## 2020-12-16 DIAGNOSIS — I2510 Atherosclerotic heart disease of native coronary artery without angina pectoris: Secondary | ICD-10-CM | POA: Diagnosis not present

## 2020-12-16 DIAGNOSIS — K7469 Other cirrhosis of liver: Secondary | ICD-10-CM

## 2020-12-16 NOTE — Telephone Encounter (Signed)
I left the patient a vm that her insulin was delivered to the  office today and she could come and pick it up when she could.

## 2020-12-16 NOTE — Progress Notes (Signed)
Review of pertinent gastrointestinal problems: 1. Personal history of adenomatous colon polyps. Colonoscopy Dr. [name] 2012 August was normal except for 2 subcentimeter polyps, one was a tubular adenoma. She was recommended to have recall colonoscopy at five-year interval. She had also had previous colonoscopies with Dr. Lajoyce Corners and found to have adenomas as well. Colonoscopy Dr. [name] 07/2016 one subCM TA, recall at 5 years 2. Cirrhosis: likely from Fatty liver Paracentesis 03/2016 4 liters, elevated SAAG, no SBP, cytology neg for malignancy imaging: 03/2017 [place]: cirrhosis, splenomegaly, NEW partial (non-occlusive) PV thrombus, Gallstones and new "mild dilation of intra and extrahepatic bile ducts"; MRI 03/2017 biliary findings probably from extensive periportal fibrosis, small gallstones in GB, non-occlusive PV thrombus appears chronic.  [place] 10/2017: gallstones, thick GB, cirrhosis without masses in liver.  04/2018 ultrasound cirrhosis without focal hepatic lesions.  MRI January 2021 showed a new 3 cm mass that was "indeterminate for neoplasm".  Interventional radiology review felt this was possibly a biloma.  Follow-up MRI April 2021 arranged by interventional radiology.  MRI abdomen April 2021 no suspicious lesions in the liver to suggest hepatocellular carcinoma.  MRI April 2022 unchanged, nothing suspicious for hepatocellular cancer. Most recent AFP: 05/2020 normal MELD-NA; 05/2020 labs : 9 07/2016 EGD: large varices, + portal gastropathy; started nadolol after discussion with cardiology (nadolol 66m once dialy); no recall necessary since on Nadolol.  EGD November 2020 showed persistent large varices, banded with ligating bands.  See below Immunization for Hep A/B:  Started 06/2016 PV thrombus 2018 imaging, non-occlusive and chronic per MR; hematology workup and I agreed; no plans for anticoag (given large varices). Very large varices causing dysphagia despite 80 of nadolol daily.  EGD November 2020 placement  of 14 variceal ligating bands.  This was complicated by significant chest pain and dysphagia afterwards requiring hospital admission.  Further complicated by bleeding from band ulcer site and eventual TIPS placement November 2020.  She had significant encephalopathy issues following the TIPS but these eventually waned.  Doing well on lactulose 45 mL once daily and Xifaxan 550 twice daily as of July 2021.  HPI: This is a very pleasant 75year old woman who is here with her husband today.   I last saw ELeighannaaround 7 months ago here in our office.  At that time she was doing quite well.  She is here today for routine cirrhosis follow-up.  Her weight today is 2 pounds lower than it was 7 months ago, same scale here in our office  She is doing very well.  She is currently taking Xifaxan 550 twice daily and 35 mL of lactulose syrup once daily.  On this regimen she is having 1-2 soft formed bowel movements.  She is having no encephalopathic signs.  She has no issues with edema.  She has had no overt GI bleeding.  She is on quite minimal diuretics with Lasix 20 mg once daily.  ROS: complete GI ROS as described in HPI, all other review negative.  Constitutional:  No unintentional weight loss   Past Medical History:  Diagnosis Date   [place]fib ([place]    08/19/19 afib with RVR, spontaneously converted to SR   Allergy    Arthritis    neck   Cataract    bilateral - MD monitoring cataracts   CHF (congestive heart failure) (HCC)    Chronic kidney disease, stage I    DR OTTELIN  HX UTIS   [place]osis [place]    Coronary artery disease    s/p CABG 01/29/16:  LIMA-LAD, SVG-RI, SVG-dRCA   COVID-19 virus infection 04/2020   Cramp of limb    Diabetes mellitus    Dysphagia, unspecified(787.20)    Dysuria    Epistaxis    GERD (gastroesophageal reflux disease)    Hearing loss    wears hearing aids   Heart murmur     DX FOR YEARS ASYMPTOMATIC   Hepatic encephalopathy (HCC)    Lumbago    Myocardial  infarction ([place])    NSTEMI 01/27/16   Neoplasm of uncertain behavior of skin    Nonspecific elevation of levels of transaminase or lactic acid dehydrogenase (LDH)    Osteoarthrosis, unspecified whether generalized or localized, unspecified site    Other and unspecified hyperlipidemia    diet controlled   Pain in joint, shoulder region    Paresthesias 04/01/2015   Postablative ovarian failure    Trochanteric bursitis of left hip 12/15/2015   Type 2 diabetes mellitus without complication ([place])    Unspecified essential hypertension    no meds   Wears glasses     Past Surgical History:  Procedure Laterality Date   BACK SURGERY  06/2015   BREAST BIOPSY Left    CARDIAC CATHETERIZATION N/A 01/27/2016   Procedure: Left Heart Cath and Coronary Angiography;  Surgeon: Belva Crome, MD;  Location: [facility];  Service: Cardiovascular;  Laterality: N/A;   COLONOSCOPY  2012   Dr Lajoyce Corners.    COLONOSCOPY WITH PROPOFOL N/A 07/07/2016   Procedure: COLONOSCOPY WITH PROPOFOL;  Surgeon: [name], MD;  Location: [facility];  Service: Endoscopy;  Laterality: N/A;   CORONARY ARTERY BYPASS GRAFT N/A 01/28/2016   Procedure: CORONARY ARTERY BYPASS GRAFTING (CABG) x 3 USING RIGHT LEG GREATER SAPHENOUS VEIN GRAFT;  Surgeon: Melrose Nakayama, MD;  Location: [place];  Service: Open Heart Surgery;  Laterality: N/A;   ENDOVEIN HARVEST OF GREATER SAPHENOUS VEIN Right 01/28/2016   Procedure: ENDOVEIN HARVEST OF GREATER SAPHENOUS VEIN;  Surgeon: Melrose Nakayama, MD;  Location: [place];  Service: Open Heart Surgery;  Laterality: Right;   ESOPHAGEAL BANDING  03/28/2019   Procedure: ESOPHAGEAL BANDING;  Surgeon: [name], MD;  Location: [facility];  Service: Endoscopy;;   ESOPHAGEAL BANDING  04/07/2019   Procedure: ESOPHAGEAL BANDING;  Surgeon: Juanita Craver, MD;  Location[facility];  Service: Endoscopy;;   ESOPHAGOGASTRODUODENOSCOPY N/A 04/07/2019   Procedure: ESOPHAGOGASTRODUODENOSCOPY (EGD);   Surgeon: Juanita Craver, MD;  Location: [facility] ENDOSCOPY;  Service: Endoscopy;  Laterality: N/A;   ESOPHAGOGASTRODUODENOSCOPY (EGD) WITH PROPOFOL N/A 07/07/2016   Procedure: ESOPHAGOGASTRODUODENOSCOPY (EGD) WITH PROPOFOL;  Surgeon: [name], MD;  [facility];  Service: Endoscopy;  Laterality: N/A;   ESOPHAGOGASTRODUODENOSCOPY (EGD) WITH PROPOFOL N/A 03/28/2019   Procedure: ESOPHAGOGASTRODUODENOSCOPY (EGD) WITH PROPOFOL;  Surgeon: [name], MD;  [facility];  Service: Endoscopy;  Laterality: N/A;   HEMOSTASIS CLIP PLACEMENT  04/07/2019   Procedure: HEMOSTASIS CLIP PLACEMENT;  Surgeon: Juanita Craver, MD;  Location: [facility];  Service: Endoscopy;;   IR ANGIOGRAM SELECTIVE EACH ADDITIONAL VESSEL  04/08/2019   IR EMBO ART  VEN HEMORR LYMPH EXTRAV  INC GUIDE ROADMAPPING  04/08/2019   IR PARACENTESIS  04/08/2019   IR RADIOLOGIST EVAL & MGMT  06/13/2019   IR RADIOLOGIST EVAL & MGMT  09/25/2019   IR RADIOLOGIST EVAL & MGMT  07/07/2020   IR TIPS  04/08/2019   MAXIMUM ACCESS (MAS)POSTERIOR LUMBAR INTERBODY FUSION (PLIF) 1 LEVEL Left 06/10/2015   Procedure: FOR MAXIMUM ACCESS (MAS) POSTERIOR LUMBAR  INTERBODY FUSION (PLIF) LUMBAR THREE-FOUR EXTRAFORAMINAL MICRODISCECTOMY LUMBAR FIVE-SACRAL ONE LEFT;  Surgeon: Eustace Moore, MD;  Location: [place] [facility];  Service: Neurosurgery;  Laterality: Left;   RADIOLOGY WITH ANESTHESIA N/A 04/08/2019   Procedure: RADIOLOGY WITH ANESTHESIA;  Surgeon: Radiologist, Medication, MD;  Location: [place];  Service: Radiology;  Laterality: N/A;   SCLEROTHERAPY  04/07/2019   Procedure: SCLEROTHERAPY;  Surgeon: Juanita Craver, MD;  Location: [facility] ENDOSCOPY;  Service: Endoscopy;;   TEE WITHOUT CARDIOVERSION N/A 01/28/2016   Procedure: TRANSESOPHAGEAL ECHOCARDIOGRAM (TEE);  Surgeon: Melrose Nakayama, MD;  Location: [place];  Service: Open Heart Surgery;  Laterality: N/A;   TUBAL LIGATION  1982   Dr Lynwood Dawley HERNIA REPAIR N/A 09/10/2019   Procedure:  HERNIA REPAIR UMBILICAL ADULT;  Surgeon: Donnie Mesa, MD;  Location: [place];  Service: General;  Laterality: N/A;   UPPER GASTROINTESTINAL ENDOSCOPY     VAGINAL HYSTERECTOMY  1997   Dr Rande Lawman    Current Outpatient Medications  Medication Sig Dispense Refill   acetaminophen (TYLENOL) 500 MG tablet Take 500 mg by mouth at bedtime.     Biotin 10000 MCG TABS Take 10,000 mcg by mouth every morning.     Cyanocobalamin (VITAMIN B 12 PO) Take 1,000 mcg by mouth daily.     ezetimibe (ZETIA) 10 MG tablet TAKE 1 TABLET BY MOUTH EVERY DAY 90 tablet 1   glucose blood test strip One Touch Ultra strips. Use to test blood sugar three times daily. Dx: E11.65 300 each 3   Insulin Pen Needle (BD PEN NEEDLE NANO U/F) 32G X 4 MM MISC USE THREE TIMES DAILY AS DIRECTED 100 each 6   Insulin Syringe-Needle U-100 (INSULIN SYRINGE 1CC/31GX5/16") 31G X 5/16" 1 ML MISC USE AS DIRECTED TO INJECT LEVIMIR 100 each 2   JARDIANCE 25 MG TABS tablet TAKE 1 TABLET BY MOUTH DAILY 90 tablet 1   LEVEMIR FLEXTOUCH 100 UNIT/ML FlexPen ADMINISTER 30 UNITS UNDER THE SKIN AT BEDTIME 15 mL 3   MAGNESIUM PO Take 500 mg by mouth daily.      metoprolol tartrate (LOPRESSOR) 25 MG tablet Take 1 tablet (25 mg total) by mouth as needed (use as needed for HR greater than 110). 60 tablet 2   Multiple Vitamins-Minerals (MULTIVITAMIN WITH MINERALS) tablet Take 1 tablet by mouth daily.     Omega-3 Fatty Acids (FISH OIL PO) Take 1 capsule by mouth daily.     pantoprazole (PROTONIX) 40 MG tablet TAKE 1 TABLET(40 MG) BY MOUTH DAILY 90 tablet 1   Polyethyl Glycol-Propyl Glycol (SYSTANE OP) Place 1 drop into both eyes daily.      Probiotic Product (PROBIOTIC DAILY PO) Take 1 capsule by mouth daily. Digestive Advantage Probiotic     XIFAXAN 550 MG TABS tablet Take 1 tablet (550 mg total) by mouth 2 (two) times daily. 180 tablet 3   furosemide (LASIX) 20 MG tablet Take 1 tablet (20 mg total) by mouth daily. 90 tablet 3   No current  facility-administered medications for this visit.    Allergies as of 12/16/2020 - Review Complete 12/16/2020  Allergen Reaction Noted   Kiwi extract Anaphylaxis 05/28/2015   Tdap [tetanus-diphth-acell pertussis] Swelling and Other (See Comments) 03/27/2013   Statins Other (See Comments) 05/08/2016   Latex Itching, Dermatitis, and Rash 12/22/2010   Tramadol Nausea And Vomiting 05/28/2015    Family History  Problem Relation Age of Onset   Heart disease Mother    Breast cancer Mother    Lung  cancer Father    Arthritis Sister    Arthritis Brother    Liver cancer Brother    Breast cancer Maternal Aunt    Breast cancer Paternal Aunt    Heart disease Maternal Grandmother    Heart disease Maternal Grandfather    Heart disease Paternal Grandmother    Heart disease Paternal Grandfather    Other Daughter        house fire   Colon cancer Neg Hx    Esophageal cancer Neg Hx    Rectal cancer Neg Hx    Stomach cancer Neg Hx     Social History   Socioeconomic History   Marital status: Married    Spouse name: Not on file   Number of children: 6   Years of education: Not on file   Highest education level: Not on file  Occupational History   Occupation: retired  Tobacco Use   Smoking status: Never   Smokeless tobacco: Never  Vaping Use   Vaping Use: Never used  Substance and Sexual Activity   Alcohol use: No   Drug use: No   Sexual activity: Yes    Partners: Male    Birth control/protection: Post-menopausal, Surgical    Comment: Hysterectomy  Other Topics Concern   Not on file  Social History Narrative   She is retired she is married and lives with her husband   6 children   No alcohol or tobacco never smoker no drug use   Social Determinants of Radio broadcast assistant Strain: Not on file  Food Insecurity: Not on file  Transportation Needs: Not on file  Physical Activity: Not on file  Stress: Not on file  Social Connections: Not on file  Intimate Partner  Violence: Not on file     Physical Exam: BP 110/60   Pulse 65   Ht 5' 3"  (1.6 m)   Wt 127 lb (57.6 kg)   LMP  (LMP Unknown)   BMI 22.50 kg/m  Constitutional: generally well-appearing Psychiatric: alert and oriented x3 Abdomen: soft, nontender, nondistended, no obvious ascites, no peritoneal signs, normal bowel sounds No peripheral edema noted in lower extremities  Assessment and plan: 75 y.o. female with cirrhosis  She has done very well over the past year or so since having quite a lot of trouble following TIPS placement for esophageal variceal bleeding, encephalopathy following TIPS.  This is all really leveled out.  She has no encephalopathic symptoms on Xifaxan 550 twice daily and 35 mL of lactulose once daily.  She has no edema in her legs and no obvious ascites on exam today.  She is on diuretics.  I will think she needs any blood work or imaging right now.  Would like to start spacing her appointments for her generalized cirrhosis follow-up to every 12 months instead of every 6 months.  She knows that we will contact her however in about 6 months for blood work including CBC, complete metabolic profile, coags, alpha-fetoprotein and MRI of her liver for hepatoma screening and to recalculate the meld score.  For now follow-up 12 months, she knows to call sooner if she has troubles.  Please see the "Patient Instructions" section for addition details about the plan.  Owens Loffler, MD [facility] 12/16/2020, 8:58 AM   Total time on date of encounter was 30 minutes (this included time spent preparing to see the patient reviewing records; obtaining and/or reviewing separately obtained history; performing a medically appropriate exam and/or evaluation; counseling and educating  the patient and family if present; ordering medications, tests or procedures if applicable; and documenting clinical information in the health record).

## 2020-12-16 NOTE — Patient Instructions (Signed)
If you are age 74 or older, your body mass index should be between 23-30. Your Body mass index is 22.5 kg/m. If this is out of the aforementioned range listed, please consider follow up with your Primary Care Provider. __________________________________________________________  The  GI providers would like to encourage you to use [facility] to communicate with providers for non-urgent requests or questions.  Due to long hold times on the telephone, sending your provider a message by [facility] may be a faster and more efficient way to get a response.  Please allow 48 business hours for a response.  Please remember that this is for non-urgent requests.   You will need blood work and a MRI liver in 6 months (Feb 2023).  We will contact you to schedule this appointment.  You will need a follow up appointment in our office in 1 year (Aug 2022).  We will contact you to schedule this appointment.  Thank you for entrusting me with your care and choosing [facility].  Dr [name]

## 2020-12-16 NOTE — Telephone Encounter (Signed)
[facility] Patient Assistance [phone] to have the Dr. Ricard Dillon on patient's information since Dr. Mariea Clonts has left practice. Patient is receiving [place] FlexTouch 5x89m prefilled pen 100units/ml.   Spoke with LaNette and she stated that they will fax [place] Ref[place] Form to fill out and fax back and then they will change the Provider.   Awaiting Form.   Customer #: [id number]

## 2020-12-18 ENCOUNTER — Telehealth: Payer: Self-pay

## 2020-12-18 NOTE — Telephone Encounter (Signed)
Incoming call received from patient stating the pharmacy is waiting for [place] to reply to a form in order for her to receive her diabetic testing strips.   I called patients pharmacy and the pharmacist stated there is no form for [place] to complete and the reason patients insurance will not allow them to release rx for one touch test strips if because there is an indication that she is using a dexcom meter. I informed the pharmacist that our records do not refect her using a dexcom meter.   The pharmacist placed me on a brief hold while she attempted to override and note that patient does not have a dexcom meter. Now in order for rx to be filled a prior authorization is needed, the pharmacists stated she will prepare the PA and fax in with in 5-10 min.   I called patient back and informed her of the encounter with the pharmacist. Awaiting PA fax

## 2020-12-21 DIAGNOSIS — M542 Cervicalgia: Secondary | ICD-10-CM | POA: Diagnosis not present

## 2020-12-21 DIAGNOSIS — E089 Diabetes mellitus due to underlying condition without complications: Secondary | ICD-10-CM | POA: Diagnosis not present

## 2020-12-21 DIAGNOSIS — M7912 Myalgia of auxiliary muscles, head and neck: Secondary | ICD-10-CM | POA: Diagnosis not present

## 2020-12-21 DIAGNOSIS — M1990 Unspecified osteoarthritis, unspecified site: Secondary | ICD-10-CM | POA: Diagnosis not present

## 2020-12-21 DIAGNOSIS — M4004 Postural kyphosis, thoracic region: Secondary | ICD-10-CM | POA: Diagnosis not present

## 2020-12-21 DIAGNOSIS — M2560 Stiffness of unspecified joint, not elsewhere classified: Secondary | ICD-10-CM | POA: Diagnosis not present

## 2020-12-24 ENCOUNTER — Other Ambulatory Visit: Payer: Self-pay | Admitting: *Deleted

## 2020-12-24 DIAGNOSIS — M542 Cervicalgia: Secondary | ICD-10-CM | POA: Diagnosis not present

## 2020-12-24 DIAGNOSIS — E089 Diabetes mellitus due to underlying condition without complications: Secondary | ICD-10-CM | POA: Diagnosis not present

## 2020-12-24 DIAGNOSIS — M4004 Postural kyphosis, thoracic region: Secondary | ICD-10-CM | POA: Diagnosis not present

## 2020-12-24 DIAGNOSIS — M1990 Unspecified osteoarthritis, unspecified site: Secondary | ICD-10-CM | POA: Diagnosis not present

## 2020-12-24 DIAGNOSIS — M7912 Myalgia of auxiliary muscles, head and neck: Secondary | ICD-10-CM | POA: Diagnosis not present

## 2020-12-24 DIAGNOSIS — M2560 Stiffness of unspecified joint, not elsewhere classified: Secondary | ICD-10-CM | POA: Diagnosis not present

## 2020-12-24 NOTE — Patient Outreach (Signed)
[facility]) Care Management  [place]  12/25/2020   [name] 22-Aug-1945 [id number]    Outgoing call placed to member's son, state she is doing well, still not managing DM appropriately.  Member and husband does not answer phone when this care manager calls due to concern for scam calls, will attempt again to engage member directly.  Denies any urgent concerns, encouraged to contact this care manager with questions.    Encounter Medications:  Outpatient Encounter Medications as of 12/24/2020  Medication Sig   acetaminophen (TYLENOL) 500 MG tablet Take 500 mg by mouth at bedtime.   Biotin 10000 MCG TABS Take 10,000 mcg by mouth every morning.   Cyanocobalamin (VITAMIN B 12 PO) Take 1,000 mcg by mouth daily.   ezetimibe (ZETIA) 10 MG tablet TAKE 1 TABLET BY MOUTH EVERY DAY   furosemide (LASIX) 20 MG tablet Take 1 tablet (20 mg total) by mouth daily.   glucose blood test strip One Touch Ultra strips. Use to test blood sugar three times daily. Dx: E11.65   Insulin Pen Needle (BD PEN NEEDLE NANO U/F) 32G X 4 MM MISC USE THREE TIMES DAILY AS DIRECTED   Insulin Syringe-Needle U-100 (INSULIN SYRINGE 1CC/31GX5/16") 31G X 5/16" 1 ML MISC USE AS DIRECTED TO INJECT LEVIMIR   JARDIANCE 25 MG TABS tablet TAKE 1 TABLET BY MOUTH DAILY   LEVEMIR FLEXTOUCH 100 UNIT/ML FlexPen ADMINISTER 30 UNITS UNDER THE SKIN AT BEDTIME   MAGNESIUM PO Take 500 mg by mouth daily.    metoprolol tartrate (LOPRESSOR) 25 MG tablet Take 1 tablet (25 mg total) by mouth as needed (use as needed for HR greater than 110).   Multiple Vitamins-Minerals (MULTIVITAMIN WITH MINERALS) tablet Take 1 tablet by mouth daily.   Omega-3 Fatty Acids (FISH OIL PO) Take 1 capsule by mouth daily.   pantoprazole (PROTONIX) 40 MG tablet TAKE 1 TABLET(40 MG) BY MOUTH DAILY   Polyethyl Glycol-Propyl Glycol (SYSTANE OP) Place 1 drop into both eyes daily.    Probiotic Product (PROBIOTIC DAILY PO) Take 1  capsule by mouth daily. Digestive Advantage Probiotic   XIFAXAN 550 MG TABS tablet Take 1 tablet (550 mg total) by mouth 2 (two) times daily.   No facility-administered encounter medications on file as of 12/24/2020.    Functional Status:  In your present state of health, do you have any difficulty performing the following activities: 08/05/2020  Hearing? Y  Comment wears hearing aids  Vision? N  Difficulty concentrating or making decisions? Y  Comment remembering  Walking or climbing stairs? Y  Comment uses a cane  Dressing or bathing? N  Doing errands, shopping? N  Preparing Food and eating ? N  Using the Toilet? N  In the past six months, have you accidently leaked urine? N  Do you have problems with loss of bowel control? N  Managing your Medications? N  Managing your Finances? N  Housekeeping or managing your Housekeeping? N  Some recent data might be hidden    Fall/Depression Screening: Fall Risk  08/26/2020 08/05/2020 06/15/2020  Falls in the past year? 0 0 0  Number falls in past yr: 0 0 0  Injury with Fall? 0 0 0   PHQ 2/9 Scores 08/05/2020 06/15/2020 02/13/2020 08/26/2019 06/12/2019 05/13/2019 04/19/2019  PHQ - 2 Score 0 0 0 0 0 0 0    Assessment:   Care Plan Care Plan : Diabetes Type 2 (Adult)  Updates made by [name], RN since 12/25/2020 12:00  AM     Problem: Glycemic Management (Diabetes, Type 2)   Priority: High     Long-Range Goal: Glycemic Management Optimized evidenced by A1C decreased to </= 7 within the next 3 months   Start Date: 12/24/2020  Expected End Date: 03/25/2021  This Visit's Progress: Not on track  Recent Progress: On track  Priority: High  Note:   3/16 -   Promoted self-monitoring of blood glucose levels.   Assessed and addressed barriers to management plan, such as food insecurity, age, developmental ability, depression, anxiety, fear of hypoglycemia or weight gain, as well as medication cost, side effects and complicated regimen.    Reminded son to monitor member's diet to adhere to diabetic diet  4/13 - son reminded of upcoming appointment with PCP, plan to obtain fasting labs at that time.  8/18 - A1C remains elevated, goal reset    Task: Alleviate Barriers to Glycemic Management   Due Date: 03/25/2021  Note:   Care Management Activities:    - A1C testing facilitated - blood glucose monitoring encouraged - mutual A1C goal set or reviewed - use of blood glucose monitoring log promoted    Notes:       Goals Addressed             This Visit's Progress    [facility] - Monitor and Manage My Blood Sugar   On track    Barriers: Health Behaviors    Timeframe:  Short-Term Goal Priority:  High Start Date:      5/19 (restarted, member not always consistent)                    Expected End Date:    8/19             - check blood sugar at prescribed times - check blood sugar if I feel it is too high or too low - take the blood sugar log to all doctor visits    Why is this important?   Checking your blood sugar at home helps to keep it from getting very high or very low.  Writing the results in a diary or log helps the doctor know how to care for you.  Your blood sugar log should have the time, date and the results.  Also, write down the amount of insulin or other medicine that you take.  Other information, like what you ate, exercise done and how you were feeling, will also be helpful.     Notes:   12/9 - Reminded of proper diabetes diet, reviewed medications for glucose control  05/14/2020 - Advised son to continue to educate member regarding proper food choices  2/16 - Educated son on importance of encouraging self monitoring and medication management  4/13 - Discussed medication management with son, stressed reason to take and keep blood sugars controlled.  Son report recent blood sugars range 120-140's.  5/19 - Per son, blood sugars range 130-150's, not consistent with diet and medication  8/18 -  Son report member's blood sugars elevated, high 100's.  State member and husband eat out a lot, not monitoring diet.  They would also benefit from increase in exercise.  Will send new education regarding DM diet and exercise as well as diabetes education booklet      [facility] - Set My Target A1C   Not on track    Barriers: Health Behaviors Knowledge    Timeframe:  Long-Range Goal Priority:  Medium Start Date:  5/19                Expected End Date:     11/19               - set target A1C - less than 7    Why is this important?   Your target A1C is decided together by you and your doctor.  It is based on several things like your age and other health issues.    Notes:   12/9 - Reviewed upcoming appointment with PCP and fasting labs, preparation for new A1C  05/14/2020 - Reminded of labs on 2/2 and PCP visit on 2/7  2/16 - Son reminded of importance of diabetic diet  4/13 - diabetic plan of care reviewed with son, encouraged to continue to monitor patients diet  5/19 - Diabetes education sent to [facility]  Reminded of upcoming appointment on 6/8, new A1C will be resulted  8/18 - Per son, had follow up with PCP in June, A1C remained elevated at 9.5. Per MD note, change in insulin was discussed however specific changes not noted in chart.  Son state he and his siblings have continued to discuss appropriate DM care with member, will continue        Plan:  Follow-up: Patient agrees to Care Plan and Follow-up. Follow-up in 3 month(s).  [name], [place], MSN [place] [phone]

## 2020-12-29 DIAGNOSIS — E089 Diabetes mellitus due to underlying condition without complications: Secondary | ICD-10-CM | POA: Diagnosis not present

## 2020-12-29 DIAGNOSIS — M2560 Stiffness of unspecified joint, not elsewhere classified: Secondary | ICD-10-CM | POA: Diagnosis not present

## 2020-12-29 DIAGNOSIS — M4004 Postural kyphosis, thoracic region: Secondary | ICD-10-CM | POA: Diagnosis not present

## 2020-12-29 DIAGNOSIS — M1990 Unspecified osteoarthritis, unspecified site: Secondary | ICD-10-CM | POA: Diagnosis not present

## 2020-12-29 DIAGNOSIS — M542 Cervicalgia: Secondary | ICD-10-CM | POA: Diagnosis not present

## 2020-12-29 DIAGNOSIS — M7912 Myalgia of auxiliary muscles, head and neck: Secondary | ICD-10-CM | POA: Diagnosis not present

## 2020-12-31 DIAGNOSIS — M2560 Stiffness of unspecified joint, not elsewhere classified: Secondary | ICD-10-CM | POA: Diagnosis not present

## 2020-12-31 DIAGNOSIS — M4004 Postural kyphosis, thoracic region: Secondary | ICD-10-CM | POA: Diagnosis not present

## 2020-12-31 DIAGNOSIS — M1990 Unspecified osteoarthritis, unspecified site: Secondary | ICD-10-CM | POA: Diagnosis not present

## 2020-12-31 DIAGNOSIS — M7912 Myalgia of auxiliary muscles, head and neck: Secondary | ICD-10-CM | POA: Diagnosis not present

## 2020-12-31 DIAGNOSIS — M542 Cervicalgia: Secondary | ICD-10-CM | POA: Diagnosis not present

## 2020-12-31 DIAGNOSIS — E089 Diabetes mellitus due to underlying condition without complications: Secondary | ICD-10-CM | POA: Diagnosis not present

## 2021-01-05 ENCOUNTER — Other Ambulatory Visit: Payer: Self-pay | Admitting: Neurological Surgery

## 2021-01-05 DIAGNOSIS — R030 Elevated blood-pressure reading, without diagnosis of hypertension: Secondary | ICD-10-CM | POA: Diagnosis not present

## 2021-01-05 DIAGNOSIS — M48062 Spinal stenosis, lumbar region with neurogenic claudication: Secondary | ICD-10-CM | POA: Diagnosis not present

## 2021-01-06 ENCOUNTER — Ambulatory Visit
Admission: RE | Admit: 2021-01-06 | Discharge: 2021-01-06 | Disposition: A | Discharge status: Home / Self Care | Payer: Medicare Other | Source: Ambulatory Visit | Attending: Neurological Surgery | Admitting: Neurological Surgery

## 2021-01-06 DIAGNOSIS — M48061 Spinal stenosis, lumbar region without neurogenic claudication: Secondary | ICD-10-CM | POA: Diagnosis not present

## 2021-01-06 DIAGNOSIS — M48062 Spinal stenosis, lumbar region with neurogenic claudication: Secondary | ICD-10-CM

## 2021-01-06 DIAGNOSIS — M545 Low back pain, unspecified: Secondary | ICD-10-CM | POA: Diagnosis not present

## 2021-01-07 DIAGNOSIS — M4004 Postural kyphosis, thoracic region: Secondary | ICD-10-CM | POA: Diagnosis not present

## 2021-01-07 DIAGNOSIS — E089 Diabetes mellitus due to underlying condition without complications: Secondary | ICD-10-CM | POA: Diagnosis not present

## 2021-01-07 DIAGNOSIS — M2560 Stiffness of unspecified joint, not elsewhere classified: Secondary | ICD-10-CM | POA: Diagnosis not present

## 2021-01-07 DIAGNOSIS — M7912 Myalgia of auxiliary muscles, head and neck: Secondary | ICD-10-CM | POA: Diagnosis not present

## 2021-01-07 DIAGNOSIS — M1990 Unspecified osteoarthritis, unspecified site: Secondary | ICD-10-CM | POA: Diagnosis not present

## 2021-01-07 DIAGNOSIS — M542 Cervicalgia: Secondary | ICD-10-CM | POA: Diagnosis not present

## 2021-01-13 ENCOUNTER — Other Ambulatory Visit: Payer: Self-pay

## 2021-01-13 ENCOUNTER — Ambulatory Visit (INDEPENDENT_AMBULATORY_CARE_PROVIDER_SITE_OTHER): Payer: Medicare Other | Admitting: Family

## 2021-01-13 ENCOUNTER — Encounter (HOSPITAL_BASED_OUTPATIENT_CLINIC_OR_DEPARTMENT_OTHER): Payer: Self-pay | Admitting: Family

## 2021-01-13 VITALS — BP 110/50 | HR 66 | Ht 63.0 in | Wt 128.0 lb

## 2021-01-13 DIAGNOSIS — R600 Localized edema: Secondary | ICD-10-CM | POA: Diagnosis not present

## 2021-01-13 DIAGNOSIS — I5189 Other ill-defined heart diseases: Secondary | ICD-10-CM

## 2021-01-13 DIAGNOSIS — I480 Paroxysmal atrial fibrillation: Secondary | ICD-10-CM

## 2021-01-13 DIAGNOSIS — I25118 Atherosclerotic heart disease of native coronary artery with other forms of angina pectoris: Secondary | ICD-10-CM

## 2021-01-13 DIAGNOSIS — M542 Cervicalgia: Secondary | ICD-10-CM | POA: Diagnosis not present

## 2021-01-13 DIAGNOSIS — E782 Mixed hyperlipidemia: Secondary | ICD-10-CM | POA: Diagnosis not present

## 2021-01-13 DIAGNOSIS — Z951 Presence of aortocoronary bypass graft: Secondary | ICD-10-CM

## 2021-01-13 DIAGNOSIS — M1990 Unspecified osteoarthritis, unspecified site: Secondary | ICD-10-CM | POA: Diagnosis not present

## 2021-01-13 DIAGNOSIS — M7912 Myalgia of auxiliary muscles, head and neck: Secondary | ICD-10-CM | POA: Diagnosis not present

## 2021-01-13 DIAGNOSIS — M4004 Postural kyphosis, thoracic region: Secondary | ICD-10-CM | POA: Diagnosis not present

## 2021-01-13 DIAGNOSIS — E089 Diabetes mellitus due to underlying condition without complications: Secondary | ICD-10-CM | POA: Diagnosis not present

## 2021-01-13 DIAGNOSIS — M2560 Stiffness of unspecified joint, not elsewhere classified: Secondary | ICD-10-CM | POA: Diagnosis not present

## 2021-01-13 NOTE — Progress Notes (Signed)
Office Visit    Patient Name: [name] Date of Encounter: 01/13/2021  PCP:  [name], MD   [facility]  Cardiologist:  [name], MD  Advanced Practice Provider:  Liliane Shi, PA-C Electrophysiologist:  None    Chief Complaint    [name] is a 75 y.o. female with a hx of HTN, HLD, Dm2, CAD s/p CABG, ascites/cirrhosis, bilateral carotid artery disease, atrial fibrillation presents today for 6 month follow up   Past Medical History    Past Medical History:  Diagnosis Date   A-fib ([place])    08/19/19 afib with RVR, spontaneously converted to SR   Allergy    Arthritis    neck   Cataract    bilateral - MD monitoring cataracts   CHF (congestive heart failure) ([place])    Chronic kidney disease, stage I    DR OTTELIN  HX UTIS   Cirrhosis ([place])    Coronary artery disease    s/p CABG 01/29/16: LIMA-LAD, SVG-RI, SVG-dRCA   COVID-19 virus infection 04/2020   Cramp of limb    Diabetes mellitus    Dysphagia, unspecified(787.20)    Dysuria    Epistaxis    GERD (gastroesophageal reflux disease)    Hearing loss    wears hearing aids   Heart murmur     DX FOR YEARS ASYMPTOMATIC   Hepatic encephalopathy ([place])    Lumbago    Myocardial infarction ([place])    NSTEMI 01/27/16   Neoplasm of uncertain behavior of skin    Nonspecific elevation of levels of transaminase or lactic acid dehydrogenase (LDH)    Osteoarthrosis, unspecified whether generalized or localized, unspecified site    Other and unspecified hyperlipidemia    diet controlled   Pain in joint, shoulder region    Paresthesias 04/01/2015   Postablative ovarian failure    Trochanteric bursitis of left hip 12/15/2015   Type 2 diabetes mellitus without complication ([place])    Unspecified essential hypertension    no meds   Wears glasses    Past Surgical History:  Procedure Laterality Date   BACK SURGERY  06/2015   BREAST BIOPSY Left    CARDIAC CATHETERIZATION  N/A 01/27/2016   Procedure: Left Heart Cath and Coronary Angiography;  Surgeon: Belva Crome, MD;  Location: [facility];  Service: Cardiovascular;  Laterality: N/A;   COLONOSCOPY  2012   Dr Lajoyce Corners.    COLONOSCOPY WITH PROPOFOL N/A 07/07/2016   Procedure: COLONOSCOPY WITH PROPOFOL;  Surgeon: [name], MD;  Location: [facility];  Service: Endoscopy;  Laterality: N/A;   CORONARY ARTERY BYPASS GRAFT N/A 01/28/2016   Procedure: CORONARY ARTERY BYPASS GRAFTING (CABG) x 3 USING RIGHT LEG GREATER SAPHENOUS VEIN GRAFT;  Surgeon: Melrose Nakayama, MD;  Location: [facility];  Service: Open Heart Surgery;  Laterality: N/A;   ENDOVEIN HARVEST OF GREATER SAPHENOUS VEIN Right 01/28/2016   Procedure: ENDOVEIN HARVEST OF GREATER SAPHENOUS VEIN;  Surgeon: Melrose Nakayama, MD;  Location: [place];  Service: Open Heart Surgery;  Laterality: Right;   ESOPHAGEAL BANDING  03/28/2019   Procedure: ESOPHAGEAL BANDING;  Surgeon: [name], MD;  Location: [facility];  Service: Endoscopy;;   ESOPHAGEAL BANDING  04/07/2019   Procedure: ESOPHAGEAL BANDING;  Surgeon: Juanita Craver, MD;  Location: [facility] ENDOSCOPY;  Service: Endoscopy;;   ESOPHAGOGASTRODUODENOSCOPY N/A 04/07/2019   Procedure: ESOPHAGOGASTRODUODENOSCOPY (EGD);  Surgeon: Juanita Craver, MD;  Location: [facility] ENDOSCOPY;  Service: Endoscopy;  Laterality:  N/A;   ESOPHAGOGASTRODUODENOSCOPY (EGD) WITH PROPOFOL N/A 07/07/2016   Procedure: ESOPHAGOGASTRODUODENOSCOPY (EGD) WITH PROPOFOL;  Surgeon: [name], MD;  Location: [facility];  Service: Endoscopy;  Laterality: N/A;   ESOPHAGOGASTRODUODENOSCOPY (EGD) WITH PROPOFOL N/A 03/28/2019   Procedure: ESOPHAGOGASTRODUODENOSCOPY (EGD) WITH PROPOFOL;  Surgeon: [name], MD;  Location: [facility];  Service: Endoscopy;  Laterality: N/A;   HEMOSTASIS CLIP PLACEMENT  04/07/2019   Procedure: HEMOSTASIS CLIP PLACEMENT;  Surgeon: Juanita Craver, MD;  Location: [facility];  Service: Endoscopy;;   IR  ANGIOGRAM SELECTIVE EACH ADDITIONAL VESSEL  04/08/2019   IR EMBO ART  VEN HEMORR LYMPH EXTRAV  INC GUIDE ROADMAPPING  04/08/2019   IR PARACENTESIS  04/08/2019   IR RADIOLOGIST EVAL & MGMT  06/13/2019   IR RADIOLOGIST EVAL & MGMT  09/25/2019   IR RADIOLOGIST EVAL & MGMT  07/07/2020   IR TIPS  04/08/2019   MAXIMUM ACCESS (MAS)POSTERIOR LUMBAR INTERBODY FUSION (PLIF) 1 LEVEL Left 06/10/2015   Procedure: FOR MAXIMUM ACCESS (MAS) POSTERIOR LUMBAR INTERBODY FUSION (PLIF) LUMBAR THREE-FOUR EXTRAFORAMINAL MICRODISCECTOMY LUMBAR FIVE-SACRAL ONE LEFT;  Surgeon: Eustace Moore, MD;  Location: [place] [facility];  Service: Neurosurgery;  Laterality: Left;   RADIOLOGY WITH ANESTHESIA N/A 04/08/2019   Procedure: RADIOLOGY WITH ANESTHESIA;  Surgeon: Radiologist, Medication, MD;  Location: [facility];  Service: Radiology;  Laterality: N/A;   SCLEROTHERAPY  04/07/2019   Procedure: SCLEROTHERAPY;  Surgeon: Juanita Craver, MD;  Location[facility] ENDOSCOPY;  Service: Endoscopy;;   TEE WITHOUT CARDIOVERSION N/A 01/28/2016   Procedure: TRANSESOPHAGEAL ECHOCARDIOGRAM (TEE);  Surgeon: Melrose Nakayama, MD;  Location: [place];  Service: Open Heart Surgery;  Laterality: N/A;   TUBAL LIGATION  1982   Dr Lynwood Dawley HERNIA REPAIR N/A 09/10/2019   Procedure: HERNIA REPAIR UMBILICAL ADULT;  Surgeon: Donnie Mesa, MD;  Location: [place];  Service: General;  Laterality: N/A;   UPPER GASTROINTESTINAL ENDOSCOPY     VAGINAL HYSTERECTOMY  1997   Dr Rande Lawman    Allergies  Allergies  Allergen Reactions   Kiwi Extract Anaphylaxis   Tdap [Tetanus-Diphth-Acell Pertussis] Swelling and Other (See Comments)    Swelling at injection site, gets very hot   Statins Other (See Comments)    RHABDOMYOLYSIS   Latex Itching, Dermatitis and Rash   Tramadol Nausea And Vomiting    History of Present Illness    [name] is a 75 y.o. female with a hx of HTN, HLD, Dm2, CAD s/p CABG, ascites/cirrhosis, bilateral carotid artery disease, atrial  fibrillation last seen 06/11/20 via telemedicine.  Previous STEMI in 2017 followed by CABG with LIMA to LAD, SVG to IM, SVG to PDA with an LVEF at that time at 55 to 60%. She had postoperative ascites and cirrhosis with multiple paracentesis followed by GI.    She was in the ED 08/19/2019 for chest pain at which she was found to have new onset atrial fibrillation. While in the ED, she spontaneously converted to normal sinus rhythm. She was started on IV Lasix and IV heparin. Long term anticoagulation was not initiated due to history of f portal vein thrombosis given a large varices and prior bleeding from band ulcer site s/p TIPS per Dr. [name] with GI.  Presents today for follow up with her husband. Reports feeling overall well. She helps care for her great grandson who is 60 old which she really enjoys. Reports no shortness of breath nor dyspnea on exertion. Reports no chest pain, pressure, or tightness. No  orthopnea, PND. Reports no palpitations.  Does note 2 week history of right foot, ankle, lower calf edema. It is better in the morning and worse in the afternoon. No known injury or fall. Did have a bug bite that she got at an outdoor church event which is still healing but still with marked swelling.   EKGs/Labs/Other Studies Reviewed:   The following studies were reviewed today: Echo 02/2016 Study Conclusions   - Left ventricle: The cavity size was normal. There was mild    concentric hypertrophy. Systolic function was normal. The    estimated ejection fraction was in the range of 60% to 65%. Wall    motion was normal; there were no regional wall motion    abnormalities. There was an increased relative contribution of    atrial contraction to ventricular filling. Doppler parameters are    consistent with abnormal left ventricular relaxation (grade 1    diastolic dysfunction).  - Aortic valve: Trileaflet; normal thickness leaflets. Although the    mean AVG and peak velocity are  consistent with very mild    stenosis, the aortic valve opens normally. There was mild    regurgitation. Peak velocity (S): 247 cm/s. Mean gradient (S): 12    mm Hg. Valve area (VTI): 1.24 cm^2. Valve area (Vmax): 1.18 cm^2.    Valve area (Vmean): 1.12 cm^2. Regurgitation pressure half-time:    508 ms.  - Mitral valve: Calcified annulus. There was mild regurgitation.  - Left atrium: The atrium was mildly to moderately dilated.  - Pulmonary arteries: PA peak pressure: 38 mm Hg (S).  - Pericardium, extracardiac: There was a left pleural effusion.    Cardiac cath 01/2016 Prox RCA lesion, 75 %stenosed. Ramus lesion, 95 %stenosed. Prox LAD to Mid LAD lesion, 90 %stenosed. Mid RCA to Dist RCA lesion, 100 %stenosed. Dist RCA lesion, 100 %stenosed. The left ventricular ejection fraction is 45-50% by visual estimate. The left ventricular systolic function is normal. LV end diastolic pressure is mildly elevated.   Acute coronary syndrome with recent total occlusion of the dominant RCA, high-grade obstruction in the proximal LAD which Collateralizes the RCA, and high-grade obstruction and a moderate size ramus intermedius branch. Left ventricular systolic dysfunction with inferobasal moderate hypokinesis and EF of 45-50%. Upper normal LV EDP.     RECOMMENDATIONS: Long discussion with the patient concerning treatment options which include multivessel PCI / stenting vs coronary bypass grafting with LIMA to the LAD. If PCI, RCA will require near full metal jacket. After discussion, the patient would like the approach that will give her the most durable long-term success. IV nitroglycerin, IV fluid, high intensity statin therapy, aspirin therapy, and beta blocker therapy as tolerated. Expedited CABG because of presentation and critical nature of disease. EKG:  EKG is ordered today.  The ekg ordered today demonstrates NSR 66 bpm with voltage criteria for LVH. No acute ST/T wave changes.   Recent  Labs: 10/13/2020: ALT 39; BUN 16; Creat 0.79; Hemoglobin 13.5; Platelets 82; Potassium 3.7; Sodium 143; TSH 2.78  Recent Lipid Panel    Component Value Date/Time   CHOL 168 12/15/2020 0950   TRIG 71 12/15/2020 0950   HDL 85 12/15/2020 0950   CHOLHDL 2.0 12/15/2020 0950   CHOLHDL 2.1 02/03/2020 1009   VLDL 9 08/20/2019 0307   LDLCALC 70 12/15/2020 0950   LDLCALC 69 02/03/2020 1009    Home Medications   Current Meds  Medication Sig   acetaminophen (TYLENOL) 500 MG tablet Take 500 mg by  mouth at bedtime.   Biotin 10000 MCG TABS Take 10,000 mcg by mouth every morning.   Cyanocobalamin (VITAMIN B 12 PO) Take 1,000 mcg by mouth daily.   ezetimibe (ZETIA) 10 MG tablet TAKE 1 TABLET BY MOUTH EVERY DAY   furosemide (LASIX) 20 MG tablet Take 1 tablet (20 mg total) by mouth daily.   glucose blood test strip One Touch Ultra strips. Use to test blood sugar three times daily. Dx: E11.65   Insulin Pen Needle (BD PEN NEEDLE NANO U/F) 32G X 4 MM MISC USE THREE TIMES DAILY AS DIRECTED   Insulin Syringe-Needle U-100 (INSULIN SYRINGE 1CC/31GX5/16") 31G X 5/16" 1 ML MISC USE AS DIRECTED TO INJECT LEVIMIR   JARDIANCE 25 MG TABS tablet TAKE 1 TABLET BY MOUTH DAILY   LEVEMIR FLEXTOUCH 100 UNIT/ML FlexPen ADMINISTER 30 UNITS UNDER THE SKIN AT BEDTIME   MAGNESIUM PO Take 500 mg by mouth daily.    metoprolol tartrate (LOPRESSOR) 25 MG tablet Take 1 tablet (25 mg total) by mouth as needed (use as needed for HR greater than 110).   Multiple Vitamins-Minerals (MULTIVITAMIN WITH MINERALS) tablet Take 1 tablet by mouth daily.   Omega-3 Fatty Acids (FISH OIL PO) Take 1 capsule by mouth daily.   pantoprazole (PROTONIX) 40 MG tablet TAKE 1 TABLET(40 MG) BY MOUTH DAILY   Polyethyl Glycol-Propyl Glycol (SYSTANE OP) Place 1 drop into both eyes daily.    Probiotic Product (PROBIOTIC DAILY PO) Take 1 capsule by mouth daily. Digestive Advantage Probiotic   XIFAXAN 550 MG TABS tablet Take 1 tablet (550 mg total) by mouth 2  (two) times daily.   Review of Systems      All other systems reviewed and are otherwise negative except as noted above.  Physical Exam    VS:  BP (!) 110/50 (BP Location: Left Arm, Patient Position: Sitting, Cuff Size: Normal)   Pulse 66   Ht 5' 3"  (1.6 m)   Wt 128 lb (58.1 kg)   LMP  (LMP Unknown)   SpO2 96%   BMI 22.67 kg/m  , BMI Body mass index is 22.67 kg/m.  Wt Readings from Last 3 Encounters:  01/13/21 128 lb (58.1 kg)  12/16/20 127 lb (57.6 kg)  11/05/20 126 lb 8 oz (57.4 kg)     GEN: Well nourished, well developed, in no acute distress. HEENT: normal. Neck: Supple, no JVD, carotid bruits, or masses. Cardiac: RRR, no murmurs, rubs, or gallops. No clubbing, cyanosis.  RLE with 2 + pitting edema. Radials/PT 2+ and equal bilaterally.  Respiratory:  Respirations regular and unlabored, clear to auscultation bilaterally. GI: Soft, nontender, nondistended. MS: No deformity or atrophy. Skin: Warm and dry, no rash. Neuro:  Strength and sensation are intact. Psych: Normal affect.  Assessment & Plan    RLE edema - Given 2 week history of unilateral LE edema, plan for RLE duplex to rule out DVT given history of atrial fib not on anticoagulation. EKG today shows NSR and reports no palpitations. If unrevealing, recommend she discuss xray with her PCP though notes no injury to the ankle or foot.   PAF - Initially found at urgent care with EKG showing AF with RVR and spontaneously converted to NSR. CHADS2VASc 6. No anticoagulation due to history of portal vein thrombosis and large vaices with prior bleeding. Echo 08/2019 normal LVEF, gr2DD. Continue Metoprolol 32m PRN.   HLD- Continue to follow with PCP. Continue Zetia 134mdaily.   CAD s/p CABG - Stable with no anginal symptoms.  No indication for ischemic evaluation. Heart healthy diet and regular cardiovascular exercise encouraged.  Continue Zetia. No aspirin given bleeding history. No statin due to history of rhabdo.  GERD -  Continue Protonix.   Grade 2 diastolic dysfunction - RLE edema management detailed above. Continue Lasix at present dose. Will not increase dose due to concern for hypotension as she reports intermittent episodes of lightheadedness.   Disposition: Follow up in 6 month(s) with Dr. Johnsie Cancel or APP.  Signed, Loel Dubonnet, NP 01/13/2021, 3:25 PM [facility]

## 2021-01-13 NOTE — Patient Instructions (Addendum)
Medication Instructions:  Continue your current medications.   *If you need a refill on your cardiac medications before your next appointment, please call your pharmacy*  Lab Work: None ordered today.   Testing/Procedures:  Your EKG today looks good!  Your provider has recommended an ultrasound of your leg to rule out DVT (blood clot). If this is normal you may want to discuss an x-ray with Dr. Sabra Heck.  Follow-Up: At [facility], you and your health needs are our priority.  As part of our continuing mission to provide you with exceptional heart care, we have created designated Provider Care Teams.  These Care Teams include your primary Cardiologist (physician) and Advanced Practice Providers (APPs -  Physician Assistants and Nurse Practitioners) who all work together to provide you with the care you need, when you need it.  We recommend signing up for the patient portal called "MyChart".  Sign up information is provided on this After Visit Summary.  MyChart is used to connect with patients for Virtual Visits (Telemedicine).  Patients are able to view lab/test results, encounter notes, upcoming appointments, etc.  Non-urgent messages can be sent to your provider as well.   To learn more about what you can do with MyChart, go to [email or web address].    Your next appointment:   6 month(s)  The format for your next appointment:   In Person  Provider:   You may see [name], MD or one of the following Advanced Practice Providers on your designated Care Team:   Cecilie Kicks, NP Loel Dubonnet, NP    Other Instructions  Heart Healthy Diet Recommendations: A low-salt diet is recommended. Meats should be grilled, baked, or boiled. Avoid fried foods. Focus on lean protein sources like fish or chicken with vegetables and fruits. The American Heart Association is a Microbiologist!    Exercise recommendations: The American Heart Association recommends 150 minutes of moderate  intensity exercise weekly. Try 30 minutes of moderate intensity exercise 4-5 times per week. This could include walking, jogging, or swimming.

## 2021-01-15 DIAGNOSIS — M4004 Postural kyphosis, thoracic region: Secondary | ICD-10-CM | POA: Diagnosis not present

## 2021-01-15 DIAGNOSIS — M542 Cervicalgia: Secondary | ICD-10-CM | POA: Diagnosis not present

## 2021-01-15 DIAGNOSIS — M7912 Myalgia of auxiliary muscles, head and neck: Secondary | ICD-10-CM | POA: Diagnosis not present

## 2021-01-15 DIAGNOSIS — M1990 Unspecified osteoarthritis, unspecified site: Secondary | ICD-10-CM | POA: Diagnosis not present

## 2021-01-15 DIAGNOSIS — E089 Diabetes mellitus due to underlying condition without complications: Secondary | ICD-10-CM | POA: Diagnosis not present

## 2021-01-15 DIAGNOSIS — M2560 Stiffness of unspecified joint, not elsewhere classified: Secondary | ICD-10-CM | POA: Diagnosis not present

## 2021-01-18 ENCOUNTER — Encounter (HOSPITAL_BASED_OUTPATIENT_CLINIC_OR_DEPARTMENT_OTHER): Payer: Medicare Other

## 2021-01-19 DIAGNOSIS — M48062 Spinal stenosis, lumbar region with neurogenic claudication: Secondary | ICD-10-CM | POA: Diagnosis not present

## 2021-01-19 DIAGNOSIS — M542 Cervicalgia: Secondary | ICD-10-CM | POA: Diagnosis not present

## 2021-01-20 ENCOUNTER — Other Ambulatory Visit: Payer: Self-pay | Admitting: Neurological Surgery

## 2021-01-20 DIAGNOSIS — M542 Cervicalgia: Secondary | ICD-10-CM

## 2021-01-26 ENCOUNTER — Other Ambulatory Visit: Payer: Self-pay | Admitting: *Deleted

## 2021-01-26 DIAGNOSIS — E785 Hyperlipidemia, unspecified: Secondary | ICD-10-CM

## 2021-01-26 DIAGNOSIS — E1169 Type 2 diabetes mellitus with other specified complication: Secondary | ICD-10-CM

## 2021-01-26 MED ORDER — EMPAGLIFLOZIN 25 MG PO TABS: 25.0000 mg | ORAL_TABLET | Freq: Every day | ORAL | 1 refills | Status: DC

## 2021-01-26 MED ORDER — EZETIMIBE 10 MG PO TABS: 10.0000 mg | ORAL_TABLET | Freq: Every day | ORAL | 1 refills | Status: DC

## 2021-01-26 NOTE — Telephone Encounter (Signed)
Pharmacy requested refill

## 2021-01-26 NOTE — Telephone Encounter (Signed)
Pharmacy sent refill request.

## 2021-01-29 ENCOUNTER — Ambulatory Visit
Admission: EM | Admit: 2021-01-29 | Discharge: 2021-01-29 | Disposition: A | Discharge status: Home / Self Care | Payer: Medicare Other | Attending: Internal Medicine | Admitting: Internal Medicine

## 2021-01-29 ENCOUNTER — Other Ambulatory Visit: Payer: Self-pay

## 2021-01-29 DIAGNOSIS — R350 Frequency of micturition: Secondary | ICD-10-CM

## 2021-01-29 DIAGNOSIS — R300 Dysuria: Secondary | ICD-10-CM | POA: Diagnosis not present

## 2021-01-29 DIAGNOSIS — N3001 Acute cystitis with hematuria: Secondary | ICD-10-CM

## 2021-01-29 LAB — POCT URINALYSIS DIP (MANUAL ENTRY)
Bilirubin, UA: NEGATIVE
Glucose, UA: 500 mg/dL — AB
Ketones, POC UA: NEGATIVE mg/dL
Nitrite, UA: NEGATIVE
Protein Ur, POC: NEGATIVE mg/dL
Spec Grav, UA: 1.015 (ref 1.010–1.025)
Urobilinogen, UA: 0.2 E.U./dL
pH, UA: 6 (ref 5.0–8.0)

## 2021-01-29 MED ORDER — CEPHALEXIN 500 MG PO CAPS: 500.0000 mg | ORAL_CAPSULE | Freq: Four times a day (QID) | ORAL | 0 refills | Status: AC

## 2021-01-29 NOTE — Discharge Instructions (Addendum)
Your urine did show signs of urinary tract infection.  You are being treated with cephalexin antibiotic.  Urine culture is pending.  We will call if this is positive.

## 2021-01-29 NOTE — ED Provider Notes (Signed)
[facility]    CSN: [id number] Arrival date & time: 01/29/21  1907      History   Chief Complaint Chief Complaint  Patient presents with   UTI    HPI [name] is a 75 y.o. female.   Patient presents with urinary burning and urinary frequency that has been present for a few days.  Denies any abdominal pain, fever, pelvic pain, vaginal discharge, hematuria, back pain.  States that she has had urinary tract infections before and this "feels similar".    Past Medical History:  Diagnosis Date   A-fib ([place])    08/19/19 afib with RVR, spontaneously converted to SR   Allergy    Arthritis    neck   Cataract    bilateral - MD monitoring cataracts   CHF (congestive heart failure) (HCC)    Chronic kidney disease, stage I    DR OTTELIN  HX UTIS   Cirrhosis ([place])    Coronary artery disease    s/p CABG 01/29/16: LIMA-LAD, SVG-RI, SVG-dRCA   COVID-19 virus infection 04/2020   Cramp of limb    Diabetes mellitus    Dysphagia, unspecified(787.20)    Dysuria    Epistaxis    GERD (gastroesophageal reflux disease)    Hearing loss    wears hearing aids   Heart murmur     DX FOR YEARS ASYMPTOMATIC   Hepatic encephalopathy (HCC)    Lumbago    Myocardial infarction ([place])    NSTEMI 01/27/16   Neoplasm of uncertain behavior of skin    Nonspecific elevation of levels of transaminase or lactic acid dehydrogenase (LDH)    Osteoarthrosis, unspecified whether generalized or localized, unspecified site    Other and unspecified hyperlipidemia    diet controlled   Pain in joint, shoulder region    Paresthesias 04/01/2015   Postablative ovarian failure    Trochanteric bursitis of left hip 12/15/2015   Type 2 diabetes mellitus without complication ([place])    Unspecified essential hypertension    no meds   Wears glasses     Patient Active Problem List   Diagnosis Date Noted   Lymphopenia 11/05/2020   CHF (congestive heart failure) (HCC)    Coronary artery disease     Type II diabetes mellitus ([place])    Type 2 diabetes mellitus without complication ([place])    QTMAU-63 virus infection 04/2020   Atrial fibrillation with rapid ventricular response ([place]) 08/19/2019   S/P TIPS (transjugular intrahepatic portosystemic shunt)    Hypercalcemia 05/26/2019   AKI (acute kidney injury) ([place])    Hyponatremia    Acute lower UTI 05/16/2019   Acute delirium    General weakness 05/15/2019   Encephalopathy, hepatic ([place]) 05/15/2019   Volume depletion 05/15/2019   Bleeding esophageal varices (HCC)    UGI bleed    Infarction of spleen 04/06/2019   Odynophagia 04/01/2019   Dysphagia    Hepatoma ([place]) 01/31/2019   Coronary artery disease of native artery of native heart with stable angina pectoris ([place]) 01/31/2019   Thrombocytopenia ([place]) 09/25/2017   Left forearm pain 05/29/2017   Portal vein thrombosis 04/22/2017   Skin cancer of nose 08/23/2016   History of colonic polyps    Benign neoplasm of rectum    Esophageal varices without bleeding ([place])    Portal hypertensive gastropathy ([place])    Leg cramps 05/18/2016   Routine lab draw 05/13/2016   Iron deficiency anemia 05/09/2016   Statin-induced rhabdomyolysis 05/07/2016   Liver cirrhosis ([place]) 04/05/2016  PVC's (premature ventricular contractions) 04/05/2016   CAD (coronary artery disease) 02/16/2016   S/P CABG x 3 02/02/2016   History of non-ST elevation myocardial infarction (NSTEMI) 01/27/2016   DM (diabetes mellitus), type 2, uncontrolled ([place]) 12/15/2015   Trochanteric bursitis of left hip 12/15/2015   S/P lumbar spinal fusion 06/10/2015   Paresthesia 04/01/2015   Osteoporosis 03/31/2015   Hearing loss 06/25/2014   Insomnia 06/25/2014   Hair thinning 06/25/2014   Muscle spasm 10/08/2013   Biceps tendon rupture, proximal 11/24/2012   Hyperlipemia    GERD (gastroesophageal reflux disease)    DM (diabetes mellitus) ([place])    Essential hypertension     Past Surgical History:  Procedure Laterality  Date   BACK SURGERY  06/2015   BREAST BIOPSY Left    CARDIAC CATHETERIZATION N/A 01/27/2016   Procedure: Left Heart Cath and Coronary Angiography;  Surgeon: Belva Crome, MD;  Location: [place] [facility];  Service: Cardiovascular;  Laterality: N/A;   COLONOSCOPY  2012   Dr Lajoyce Corners.    COLONOSCOPY WITH PROPOFOL N/A 07/07/2016   Procedure: COLONOSCOPY WITH PROPOFOL;  Surgeon: [name], MD;  Location: [facility];  Service: Endoscopy;  Laterality: N/A;   CORONARY ARTERY BYPASS GRAFT N/A 01/28/2016   Procedure: CORONARY ARTERY BYPASS GRAFTING (CABG) x 3 USING RIGHT LEG GREATER SAPHENOUS VEIN GRAFT;  Surgeon: Melrose Nakayama, MD;  Location: [place];  Service: Open Heart Surgery;  Laterality: N/A;   ENDOVEIN HARVEST OF GREATER SAPHENOUS VEIN Right 01/28/2016   Procedure: ENDOVEIN HARVEST OF GREATER SAPHENOUS VEIN;  Surgeon: Melrose Nakayama, MD;  Location: [place];  Service: Open Heart Surgery;  Laterality: Right;   ESOPHAGEAL BANDING  03/28/2019   Procedure: ESOPHAGEAL BANDING;  Surgeon: [name], MD;  Location: [facility];  Service: Endoscopy;;   ESOPHAGEAL BANDING  04/07/2019   Procedure: ESOPHAGEAL BANDING;  Surgeon: Juanita Craver, MD;  Location: [facility] ENDOSCOPY;  Service: Endoscopy;;   ESOPHAGOGASTRODUODENOSCOPY N/A 04/07/2019   Procedure: ESOPHAGOGASTRODUODENOSCOPY (EGD);  Surgeon: Juanita Craver, MD;  Location[facility] END[facility]COPY;  Service: Endoscopy;  Laterality: N/A;   ESOPHAGOGASTRODUODENOSCOPY (EGD) WITH PROPOFOL N/A 07/07/2016   Procedure: ESOPHAGOGASTRODUODENOSCOPY (EGD) WITH PROPOFOL;  Surgeon: [name], MD;  Location: [facility];  Service: Endoscopy;  Laterality: N/A;   ESOPHAGOGASTRODUODENOSCOPY (EGD) WITH PROPOFOL N/A 03/28/2019   Procedure: ESOPHAGOGASTRODUODENOSCOPY (EGD) WITH PROPOFOL;  Surgeon: [name], MD;  Location: [facility];  Service: Endoscopy;  Laterality: N/A;   HEMOSTASIS CLIP PLACEMENT  04/07/2019   Procedure: HEMOSTASIS CLIP PLACEMENT;  Surgeon:  Juanita Craver, MD;  Location: [facility];  Service: Endoscopy;;   IR ANGIOGRAM SELECTIVE EACH ADDITIONAL VESSEL  04/08/2019   IR EMBO ART  VEN HEMORR LYMPH EXTRAV  INC GUIDE ROADMAPPING  04/08/2019   IR PARACENTESIS  04/08/2019   IR RADIOLOGIST EVAL & MGMT  06/13/2019   IR RADIOLOGIST EVAL & MGMT  09/25/2019   IR RADIOLOGIST EVAL & MGMT  07/07/2020   IR TIPS  04/08/2019   MAXIMUM ACCESS (MAS)POSTERIOR LUMBAR INTERBODY FUSION (PLIF) 1 LEVEL Left 06/10/2015   Procedure: FOR MAXIMUM ACCESS (MAS) POSTERIOR LUMBAR INTERBODY FUSION (PLIF) LUMBAR THREE-FOUR EXTRAFORAMINAL MICRODISCECTOMY LUMBAR FIVE-SACRAL ONE LEFT;  Surgeon: Eustace Moore, MD;  Location: [place] [facility];  Service: Neurosurgery;  Laterality: Left;   RADIOLOGY WITH ANESTHESIA N/A 04/08/2019   Procedure: RADIOLOGY WITH ANESTHESIA;  Surgeon: Radiologist, Medication, MD;  Location: [place];  Service: Radiology;  Laterality: N/A;   SCLEROTHERAPY  04/07/2019   Procedure: SCLEROTHERAPY;  Surgeon:  Juanita Craver, MD;  Location: [facility] ENDOSCOPY;  Service: Endoscopy;;   TEE WITHOUT CARDIOVERSION N/A 01/28/2016   Procedure: TRANSESOPHAGEAL ECHOCARDIOGRAM (TEE);  Surgeon: Melrose Nakayama, MD;  Location: [place];  Service: Open Heart Surgery;  Laterality: N/A;   TUBAL LIGATION  1982   Dr Lynwood Dawley HERNIA REPAIR N/A 09/10/2019   Procedure: HERNIA REPAIR UMBILICAL ADULT;  Surgeon: Donnie Mesa, MD;  Location: [place];  Service: General;  Laterality: N/A;   UPPER GASTROINTESTINAL ENDOSCOPY     VAGINAL HYSTERECTOMY  1997   Dr Rande Lawman    OB History   No obstetric history on file.      Home Medications    Prior to Admission medications   Medication Sig Start Date End Date Taking? Authorizing Provider  cephALEXin (KEFLEX) 500 MG capsule Take 1 capsule (500 mg total) by mouth 4 (four) times daily for 5 days. 01/29/21 02/03/21 Yes Odis Luster, FNP  acetaminophen (TYLENOL) 500 MG tablet Take 500 mg by mouth at bedtime.    [provider]   Biotin 10000 MCG TABS Take 10,000 mcg by mouth every morning.    [provider]  Cyanocobalamin (VITAMIN B 12 PO) Take 1,000 mcg by mouth daily.    [provider]  empagliflozin (JARDIANCE) 25 MG TABS tablet Take 1 tablet (25 mg total) by mouth daily. 01/26/21   [name], MD  ezetimibe (ZETIA) 10 MG tablet Take 1 tablet (10 mg total) by mouth daily. 01/26/21   [name], MD  furosemide (LASIX) 20 MG tablet Take 1 tablet (20 mg total) by mouth daily. 06/11/20 01/13/21  Kathyrn Drown D, NP  glucose blood test strip One Touch Ultra strips. Use to test blood sugar three times daily. Dx: E11.65 12/03/20   [name], MD  Insulin Pen Needle (BD PEN NEEDLE NANO U/F) 32G X 4 MM MISC USE THREE TIMES DAILY AS DIRECTED 10/14/19   Reed, Tiffany L, DO  Insulin Syringe-Needle U-100 (INSULIN SYRINGE 1CC/31GX5/16") 31G X 5/16" 1 ML MISC USE AS DIRECTED TO INJECT LEVIMIR 07/29/20   [name], MD  LEVEMIR FLEXTOUCH 100 UNIT/ML FlexPen ADMINISTER 30 UNITS UNDER THE SKIN AT BEDTIME 03/25/20   Reed, Tiffany L, DO  MAGNESIUM PO Take 500 mg by mouth daily.     [provider]  metoprolol tartrate (LOPRESSOR) 25 MG tablet Take 1 tablet (25 mg total) by mouth as needed (use as needed for HR greater than 110). 08/21/19   Tommie Raymond, NP  Multiple Vitamins-Minerals (MULTIVITAMIN WITH MINERALS) tablet Take 1 tablet by mouth daily.    [provider]  Omega-3 Fatty Acids (FISH OIL PO) Take 1 capsule by mouth daily.    [provider]  pantoprazole (PROTONIX) 40 MG tablet TAKE 1 TABLET(40 MG) BY MOUTH DAILY 12/14/20   [name], MD  Polyethyl Glycol-Propyl Glycol (SYSTANE OP) Place 1 drop into both eyes daily.     [provider]  Probiotic Product (PROBIOTIC DAILY PO) Take 1 capsule by mouth daily. Digestive Advantage Probiotic    [provider]  XIFAXAN 550 MG TABS tablet Take 1 tablet (550 mg total) by mouth 2 (two) times  daily. 08/05/20   [name], MD    Family History Family History  Problem Relation Age of Onset   Heart disease Mother    Breast cancer Mother    Lung cancer Father    Arthritis Sister    Arthritis Brother  Liver cancer Brother    Breast cancer Maternal Aunt    Breast cancer Paternal Aunt    Heart disease Maternal Grandmother    Heart disease Maternal Grandfather    Heart disease Paternal Grandmother    Heart disease Paternal Grandfather    Other Daughter        house fire   Colon cancer Neg Hx    Esophageal cancer Neg Hx    Rectal cancer Neg Hx    Stomach cancer Neg Hx     Social History Social History   Tobacco Use   Smoking status: Never   Smokeless tobacco: Never  Vaping Use   Vaping Use: Never used  Substance Use Topics   Alcohol use: No   Drug use: No     Allergies   Kiwi extract, Tdap [tetanus-diphth-acell pertussis], Statins, Latex, and Tramadol   Review of Systems Review of Systems Per HPI  Physical Exam Triage Vital Signs ED Triage Vitals  Enc Vitals Group     BP 01/29/21 1929 130/69     Pulse Rate 01/29/21 1929 65     Resp 01/29/21 1929 18     Temp 01/29/21 1929 97.9 F (36.6 C)     Temp Source 01/29/21 1929 Oral     SpO2 01/29/21 1929 98 %     Weight --      Height --      Head Circumference --      Peak Flow --      Pain Score 01/29/21 1930 0     Pain Loc --      Pain Edu? --      Excl. in [place]? --    No data found.  Updated Vital Signs BP 130/69 (BP Location: Left Arm)   Pulse 65   Temp 97.9 F (36.6 C) (Oral)   Resp 18   LMP  (LMP Unknown)   SpO2 98%   Visual Acuity Right Eye Distance:   Left Eye Distance:   Bilateral Distance:    Right Eye Near:   Left Eye Near:    Bilateral Near:     Physical Exam Constitutional:      Appearance: Normal appearance.  HENT:     Head: Normocephalic and atraumatic.  Eyes:     Extraocular Movements: Extraocular movements intact.     Conjunctiva/sclera: Conjunctivae  normal.  Cardiovascular:     Rate and Rhythm: Normal rate and regular rhythm.     Pulses: Normal pulses.     Heart sounds: Normal heart sounds.  Pulmonary:     Effort: Pulmonary effort is normal. No respiratory distress.     Breath sounds: Normal breath sounds.  Abdominal:     General: Abdomen is flat. Bowel sounds are normal. There is no distension.     Palpations: Abdomen is soft.     Tenderness: There is no abdominal tenderness.  Skin:    General: Skin is warm and dry.  Neurological:     General: No focal deficit present.     Mental Status: She is alert and oriented to person, place, and time. Mental status is at baseline.  Psychiatric:        Mood and Affect: Mood normal.        Behavior: Behavior normal.        Thought Content: Thought content normal.        Judgment: Judgment normal.     UC Treatments / Results  Labs (all labs ordered are listed, but only  abnormal results are displayed) Labs Reviewed  POCT URINALYSIS DIP (MANUAL ENTRY) - Abnormal; Notable for the following components:      Result Value   Clarity, UA cloudy (*)    Glucose, UA =500 (*)    Blood, UA trace-intact (*)    Leukocytes, UA Trace (*)    All other components within normal limits  URINE CULTURE    EKG   Radiology No results found.  Procedures Procedures (including critical care time)  Medications Ordered in UC Medications - No data to display  Initial Impression / Assessment and Plan / UC Course  I have reviewed the triage vital signs and the nursing notes.  Pertinent labs & imaging results that were available during my care of the patient were reviewed by me and considered in my medical decision making (see chart for details).     Urinalysis showing signs of urinary tract infection.  Urine culture is pending.   Will treat with cephalexin x5 days.  Patient to increase water intake.  No red flags seen on exam.  Discussed strict return precautions. Patient verbalized understanding  and is agreeable with plan.  Final Clinical Impressions(s) / UC Diagnoses   Final diagnoses:  Dysuria  Urinary frequency  Acute cystitis with hematuria     Discharge Instructions      Your urine did show signs of urinary tract infection.  You are being treated with cephalexin antibiotic.  Urine culture is pending.  We will call if this is positive.     ED Prescriptions     Medication Sig Dispense Auth. Provider   cephALEXin (KEFLEX) 500 MG capsule Take 1 capsule (500 mg total) by mouth 4 (four) times daily for 5 days. 20 capsule Odis Luster, FNP      PDMP not reviewed this encounter.   Odis Luster, FNP 01/29/21 2003

## 2021-01-29 NOTE — ED Triage Notes (Signed)
Pt c/o dysuria and polyuria onset a few days ago. States she has had many UTIs and is familiar with the sensation.

## 2021-01-30 ENCOUNTER — Ambulatory Visit
Admission: RE | Admit: 2021-01-30 | Discharge: 2021-01-30 | Disposition: A | Discharge status: Home / Self Care | Payer: Medicare Other | Source: Ambulatory Visit | Attending: Neurological Surgery | Admitting: Neurological Surgery

## 2021-01-30 DIAGNOSIS — M4802 Spinal stenosis, cervical region: Secondary | ICD-10-CM | POA: Diagnosis not present

## 2021-01-30 DIAGNOSIS — M542 Cervicalgia: Secondary | ICD-10-CM

## 2021-02-02 DIAGNOSIS — R030 Elevated blood-pressure reading, without diagnosis of hypertension: Secondary | ICD-10-CM | POA: Diagnosis not present

## 2021-02-02 DIAGNOSIS — M542 Cervicalgia: Secondary | ICD-10-CM | POA: Diagnosis not present

## 2021-02-02 LAB — URINE CULTURE: Culture: >=100000 — AB

## 2021-02-07 IMAGING — DX DG CHEST 1V PORT
1 series · 1 of 1 positions shown · non-contrast
Comparison: Fluoroscopic esophagram 03/18/2019, prior chest
radiograph 03/29/2016

CLINICAL DATA: Epigastric pain. Additional history provided:
Patient reports being unable to swallow even small amounts.

EXAM:
PORTABLE CHEST 1 VIEW

[chest ap]
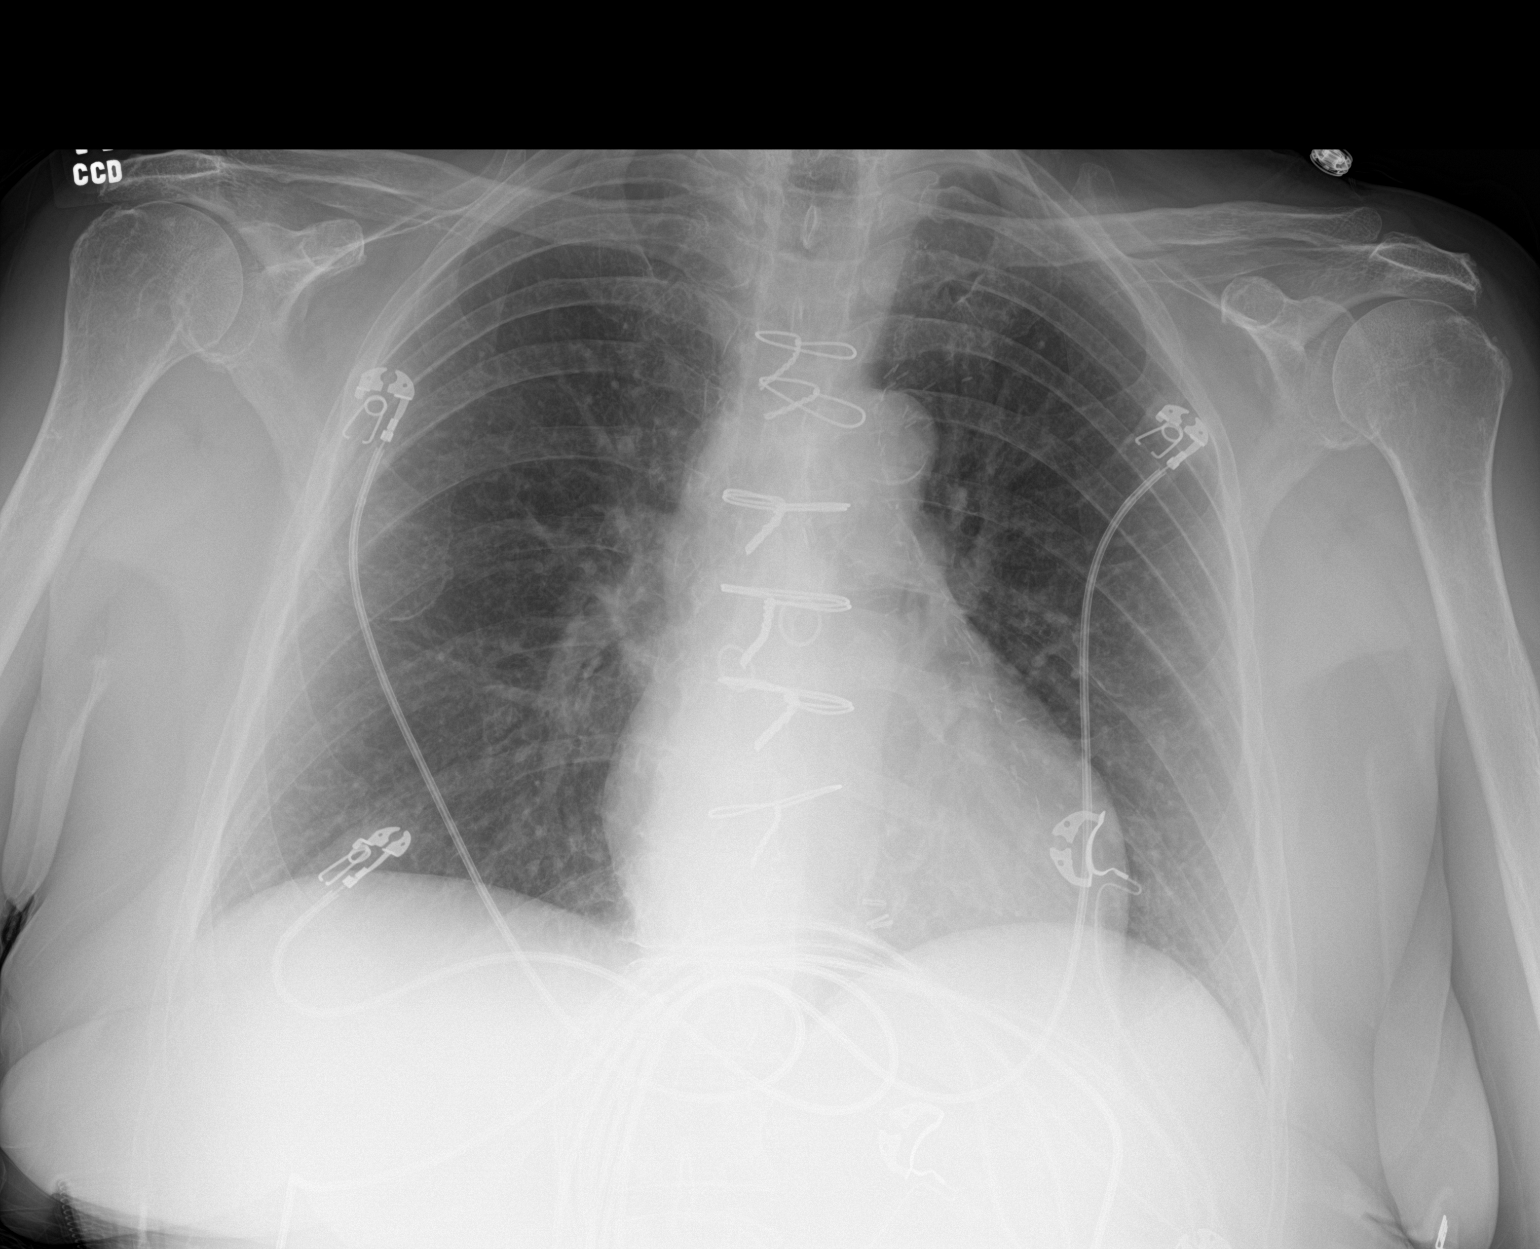

[1 of 1 positions shown; findings below may reference images not displayed]

FINDINGS: Sequela prior median sternotomy/CABG. Heart size within normal
limits. Aortic atherosclerosis

No airspace consolidation

No evidence of pleural effusion or pneumothorax.

No acute bony abnormality.

Overlying cardiac monitoring leads.
IMPRESSION: No airspace consolidation

## 2021-02-10 ENCOUNTER — Ambulatory Visit
Admission: RE | Admit: 2021-02-10 | Discharge: 2021-02-10 | Disposition: A | Discharge status: Home / Self Care | Payer: Medicare Other | Source: Ambulatory Visit | Attending: Family | Admitting: Family

## 2021-02-10 ENCOUNTER — Other Ambulatory Visit: Payer: Self-pay | Admitting: Family Medicine

## 2021-02-10 ENCOUNTER — Other Ambulatory Visit: Payer: Self-pay

## 2021-02-10 DIAGNOSIS — Z780 Asymptomatic menopausal state: Secondary | ICD-10-CM | POA: Diagnosis not present

## 2021-02-10 DIAGNOSIS — M810 Age-related osteoporosis without current pathological fracture: Secondary | ICD-10-CM | POA: Diagnosis not present

## 2021-02-10 DIAGNOSIS — M8588 Other specified disorders of bone density and structure, other site: Secondary | ICD-10-CM

## 2021-02-10 DIAGNOSIS — E1165 Type 2 diabetes mellitus with hyperglycemia: Secondary | ICD-10-CM

## 2021-02-11 ENCOUNTER — Other Ambulatory Visit: Payer: Medicare Other

## 2021-02-11 DIAGNOSIS — E1165 Type 2 diabetes mellitus with hyperglycemia: Secondary | ICD-10-CM | POA: Diagnosis not present

## 2021-02-12 LAB — HEMOGLOBIN A1C
Hgb A1c MFr Bld: 9.1 % of total Hgb — ABNORMAL HIGH (ref <5.7)
Mean Plasma Glucose: 214 mg/dL
eAG (mmol/L): 11.9 mmol/L

## 2021-02-15 ENCOUNTER — Ambulatory Visit
Admission: RE | Admit: 2021-02-15 | Discharge: 2021-02-15 | Disposition: A | Discharge status: Home / Self Care | Payer: Medicare Other | Source: Ambulatory Visit | Attending: Family | Admitting: Family

## 2021-02-15 ENCOUNTER — Other Ambulatory Visit: Payer: Self-pay

## 2021-02-15 DIAGNOSIS — Z1231 Encounter for screening mammogram for malignant neoplasm of breast: Secondary | ICD-10-CM | POA: Diagnosis not present

## 2021-02-17 ENCOUNTER — Encounter: Payer: Self-pay | Admitting: Family Medicine

## 2021-02-17 ENCOUNTER — Ambulatory Visit (INDEPENDENT_AMBULATORY_CARE_PROVIDER_SITE_OTHER): Payer: Medicare Other | Admitting: Family Medicine

## 2021-02-17 ENCOUNTER — Other Ambulatory Visit: Payer: Self-pay

## 2021-02-17 VITALS — BP 122/58 | HR 59 | Temp 96.2°F | Ht 63.0 in | Wt 128.6 lb

## 2021-02-17 DIAGNOSIS — E1021 Type 1 diabetes mellitus with diabetic nephropathy: Secondary | ICD-10-CM | POA: Diagnosis not present

## 2021-02-17 DIAGNOSIS — E119 Type 2 diabetes mellitus without complications: Secondary | ICD-10-CM | POA: Diagnosis not present

## 2021-02-17 DIAGNOSIS — Z794 Long term (current) use of insulin: Secondary | ICD-10-CM

## 2021-02-17 DIAGNOSIS — I2510 Atherosclerotic heart disease of native coronary artery without angina pectoris: Secondary | ICD-10-CM

## 2021-02-17 DIAGNOSIS — Z23 Encounter for immunization: Secondary | ICD-10-CM | POA: Diagnosis not present

## 2021-02-17 NOTE — Progress Notes (Signed)
Provider:  Alain Honey, MD  Careteam: Patient Care Team: [name], MD as PCP - General (Family Medicine) Josue Hector, MD as PCP - Cardiology (Cardiology) Berle Mull, MD as Consulting Physician (Family Medicine) Sharmon Revere as Physician Assistant (Cardiology) Calvert Cantor, MD as Consulting Physician (Ophthalmology) [name], MD as Attending Physician (Gastroenterology) [name], RN as [facility] Management  PLACE OF SERVICE:  [place]  Advanced Directive information    Allergies  Allergen Reactions   Kiwi Extract Anaphylaxis   Tdap [Tetanus-Diphth-Acell Pertussis] Swelling and Other (See Comments)    Swelling at injection site, gets very hot   Statins Other (See Comments)    RHABDOMYOLYSIS   Latex Itching, Dermatitis and Rash   Tramadol Nausea And Vomiting    Chief Complaint  Patient presents with   Medical Management of Chronic Issues    Patient presents today for a 4 month follow-up   Quality Metric Gaps    Eye exam, COVID, Flu and zoster vaccines     HPI: Patient is a 75 y.o. female here today for routine follow-up.  We checked her A1c last week and it is elevated at 9.4 consistent with blood sugar greater than 200 on average.  She tells me that she is seeing some morning lows and would like to decrease her basal insulin at bedtime.  I think it is healthier for her to average 200 that have lows in the morning. She has an eye exam coming up with ophthalmology. She denies any foot problems enough to warrant treatment although there is some suggestion of some neuropathy  Review of Systems:  Review of Systems  Constitutional: Negative.   HENT: Negative.    Respiratory: Negative.    Cardiovascular: Negative.   Neurological: Negative.   Psychiatric/Behavioral: Negative.    All other systems reviewed and are negative.  Past Medical History:  Diagnosis Date   A-fib ([place])    08/19/19 afib with RVR,  spontaneously converted to SR   Allergy    Arthritis    neck   Cataract    bilateral - MD monitoring cataracts   CHF (congestive heart failure) (HCC)    Chronic kidney disease, stage I    DR OTTELIN  HX UTIS   Cirrhosis ([place])    Coronary artery disease    s/p CABG 01/29/16: LIMA-LAD, SVG-RI, SVG-dRCA   COVID-19 virus infection 04/2020   Cramp of limb    Diabetes mellitus    Dysphagia, unspecified(787.20)    Dysuria    Epistaxis    GERD (gastroesophageal reflux disease)    Hearing loss    wears hearing aids   Heart murmur     DX FOR YEARS ASYMPTOMATIC   Hepatic encephalopathy    Lumbago    Myocardial infarction ([place])    NSTEMI 01/27/16   Neoplasm of uncertain behavior of skin    Nonspecific elevation of levels of transaminase or lactic acid dehydrogenase (LDH)    Osteoarthrosis, unspecified whether generalized or localized, unspecified site    Other and unspecified hyperlipidemia    diet controlled   Pain in joint, shoulder region    Paresthesias 04/01/2015   Postablative ovarian failure    Trochanteric bursitis of left hip 12/15/2015   Type 2 diabetes mellitus without complication ([place])    Unspecified essential hypertension    no meds   Wears glasses    Past Surgical History:  Procedure Laterality Date   BACK SURGERY  06/2015  BREAST BIOPSY Left 12/18/2019   CARDIAC CATHETERIZATION N/A 01/27/2016   Procedure: Left Heart Cath and Coronary Angiography;  Surgeon: Belva Crome, MD;  Location: [facility];  Service: Cardiovascular;  Laterality: N/A;   COLONOSCOPY  2012   Dr Lajoyce Corners.    COLONOSCOPY WITH PROPOFOL N/A 07/07/2016   Procedure: COLONOSCOPY WITH PROPOFOL;  Surgeon: [name], MD;  Location: [facility];  Service: Endoscopy;  Laterality: N/A;   CORONARY ARTERY BYPASS GRAFT N/A 01/28/2016   Procedure: CORONARY ARTERY BYPASS GRAFTING (CABG) x 3 USING RIGHT LEG GREATER SAPHENOUS VEIN GRAFT;  Surgeon: Melrose Nakayama, MD;  Location: [facility];  Service:  Open Heart Surgery;  Laterality: N/A;   ENDOVEIN HARVEST OF GREATER SAPHENOUS VEIN Right 01/28/2016   Procedure: ENDOVEIN HARVEST OF GREATER SAPHENOUS VEIN;  Surgeon: Melrose Nakayama, MD;  Location: [facility];  Service: Open Heart Surgery;  Laterality: Right;   ESOPHAGEAL BANDING  03/28/2019   Procedure: ESOPHAGEAL BANDING;  Surgeon: [name], MD;  Location: [facility];  Service: Endoscopy;;   ESOPHAGEAL BANDING  04/07/2019   Procedure: ESOPHAGEAL BANDING;  Surgeon: Juanita Craver, MD;  Location: [facility] ENDOSCOPY;  Service: Endoscopy;;   ESOPHAGOGASTRODUODENOSCOPY N/A 04/07/2019   Procedure: ESOPHAGOGASTRODUODENOSCOPY (EGD);  Surgeon: Juanita Craver, MD;  Location: [facility] ENDOSCOPY;  Service: Endoscopy;  Laterality: N/A;   ESOPHAGOGASTRODUODENOSCOPY (EGD) WITH PROPOFOL N/A 07/07/2016   Procedure: ESOPHAGOGASTRODUODENOSCOPY (EGD) WITH PROPOFOL;  Surgeon: [name], MD;  Location: [facility];  Service: Endoscopy;  Laterality: N/A;   ESOPHAGOGASTRODUODENOSCOPY (EGD) WITH PROPOFOL N/A 03/28/2019   Procedure: ESOPHAGOGASTRODUODENOSCOPY (EGD) WITH PROPOFOL;  Surgeon: [name], MD;  Location: [facility];  Service: Endoscopy;  Laterality: N/A;   HEMOSTASIS CLIP PLACEMENT  04/07/2019   Procedure: HEMOSTASIS CLIP PLACEMENT;  Surgeon: Juanita Craver, MD;  Location: [facility];  Service: Endoscopy;;   IR ANGIOGRAM SELECTIVE EACH ADDITIONAL VESSEL  04/08/2019   IR EMBO ART  VEN HEMORR LYMPH EXTRAV  INC GUIDE ROADMAPPING  04/08/2019   IR PARACENTESIS  04/08/2019   IR RADIOLOGIST EVAL & MGMT  06/13/2019   IR RADIOLOGIST EVAL & MGMT  09/25/2019   IR RADIOLOGIST EVAL & MGMT  07/07/2020   IR TIPS  04/08/2019   MAXIMUM ACCESS (MAS)POSTERIOR LUMBAR INTERBODY FUSION (PLIF) 1 LEVEL Left 06/10/2015   Procedure: FOR MAXIMUM ACCESS (MAS) POSTERIOR LUMBAR INTERBODY FUSION (PLIF) LUMBAR THREE-FOUR EXTRAFORAMINAL MICRODISCECTOMY LUMBAR FIVE-SACRAL ONE LEFT;  Surgeon: Eustace Moore, MD;  Location: [facility]  NEURO ORS;  Service: Neurosurgery;  Laterality: Left;   RADIOLOGY WITH ANESTHESIA N/A 04/08/2019   Procedure: RADIOLOGY WITH ANESTHESIA;  Surgeon: Radiologist, Medication, MD;  Location: [facility];  Service: Radiology;  Laterality: N/A;   SCLEROTHERAPY  04/07/2019   Procedure: SCLEROTHERAPY;  Surgeon: Juanita Craver, MD;  Location[facility];  Service: Endoscopy;;   TEE WITHOUT CARDIOVERSION N/A 01/28/2016   Procedure: TRANSESOPHAGEAL ECHOCARDIOGRAM (TEE);  Surgeon: Melrose Nakayama, MD;  Location: [facility];  Service: Open Heart Surgery;  Laterality: N/A;   TUBAL LIGATION  1982   Dr Lynwood Dawley HERNIA REPAIR N/A 09/10/2019   Procedure: HERNIA REPAIR UMBILICAL ADULT;  Surgeon: Donnie Mesa, MD;  Location: [place] [facility];  Service: General;  Laterality: N/A;   UPPER GASTROINTESTINAL ENDOSCOPY     VAGINAL HYSTERECTOMY  1997   Dr Rande Lawman   Social History:   reports that she has never smoked. She has never used smokeless tobacco. She reports that she does not drink alcohol and does not use drugs.  Family History  Problem Relation Age of Onset   Heart disease Mother    Breast cancer Mother    Lung cancer Father    Arthritis Sister    Arthritis Brother    Liver cancer Brother    Breast cancer Maternal Aunt    Breast cancer Paternal Aunt    Heart disease Maternal Grandmother    Heart disease Maternal Grandfather    Heart disease Paternal Grandmother    Heart disease Paternal Grandfather    Other Daughter        house fire   Colon cancer Neg Hx    Esophageal cancer Neg Hx    Rectal cancer Neg Hx    Stomach cancer Neg Hx     Medications: Patient's Medications  New Prescriptions   No medications on file  Previous Medications   ACETAMINOPHEN (TYLENOL) 500 MG TABLET    Take 500 mg by mouth at bedtime.   BIOTIN 32202 MCG TABS    Take 10,000 mcg by mouth every morning.   CYANOCOBALAMIN (VITAMIN B 12 PO)    Take 1,000 mcg by mouth daily.   EMPAGLIFLOZIN (JARDIANCE) 25 MG TABS TABLET     Take 1 tablet (25 mg total) by mouth daily.   EZETIMIBE (ZETIA) 10 MG TABLET    Take 1 tablet (10 mg total) by mouth daily.   FUROSEMIDE (LASIX) 20 MG TABLET    Take 1 tablet (20 mg total) by mouth daily.   GLUCOSE BLOOD TEST STRIP    One Touch Ultra strips. Use to test blood sugar three times daily. Dx: E11.65   INSULIN PEN NEEDLE (BD PEN NEEDLE NANO U/F) 32G X 4 MM MISC    USE THREE TIMES DAILY AS DIRECTED   INSULIN SYRINGE-NEEDLE U-100 (INSULIN SYRINGE 1CC/31GX5/16") 31G X 5/16" 1 ML MISC    USE AS DIRECTED TO INJECT LEVIMIR   LEVEMIR FLEXTOUCH 100 UNIT/ML FLEXPEN    ADMINISTER 30 UNITS UNDER THE SKIN AT BEDTIME   MAGNESIUM PO    Take 500 mg by mouth daily.    METOPROLOL TARTRATE (LOPRESSOR) 25 MG TABLET    Take 1 tablet (25 mg total) by mouth as needed (use as needed for HR greater than 110).   MULTIPLE VITAMINS-MINERALS (MULTIVITAMIN WITH MINERALS) TABLET    Take 1 tablet by mouth daily.   OMEGA-3 FATTY ACIDS (FISH OIL PO)    Take 1 capsule by mouth daily.   PANTOPRAZOLE (PROTONIX) 40 MG TABLET    TAKE 1 TABLET(40 MG) BY MOUTH DAILY   POLYETHYL GLYCOL-PROPYL GLYCOL (SYSTANE OP)    Place 1 drop into both eyes daily.    PROBIOTIC PRODUCT (PROBIOTIC DAILY PO)    Take 1 capsule by mouth daily. Digestive Advantage Probiotic   XIFAXAN 550 MG TABS TABLET    Take 1 tablet (550 mg total) by mouth 2 (two) times daily.  Modified Medications   No medications on file  Discontinued Medications   No medications on file    Physical Exam:  Vitals:   02/17/21 0901  BP: (!) 122/58  Pulse: (!) 59  Temp: (!) 96.2 F (35.7 C)  SpO2: 98%  Weight: 128 lb 9.6 oz (58.3 kg)  Height: 5' 3"  (1.6 m)   Body mass index is 22.78 kg/m. Wt Readings from Last 3 Encounters:  02/17/21 128 lb 9.6 oz (58.3 kg)  01/13/21 128 lb (58.1 kg)  12/16/20 127 lb (57.6 kg)    Physical Exam Vitals and nursing note reviewed.  Constitutional:  Appearance: Normal appearance.  Cardiovascular:     Rate and Rhythm:  Normal rate and regular rhythm.  Pulmonary:     Effort: Pulmonary effort is normal.     Breath sounds: Normal breath sounds.  Neurological:     General: No focal deficit present.     Mental Status: She is oriented to person, place, and time.  Psychiatric:        Mood and Affect: Mood normal.        Behavior: Behavior normal.    Labs reviewed: Basic Metabolic Panel: Recent Labs    05/12/20 0948 06/10/20 0826 10/13/20 0843  NA 139 141 143  K 3.5 3.9 3.7  CL 104 107 107  CO2 26 27 26   GLUCOSE 220* 112* 93  BUN 15 14 16   CREATININE 0.80 0.76 0.79  CALCIUM 9.5 9.4 10.0  TSH  --   --  2.78   Liver Function Tests: Recent Labs    05/12/20 0948 06/10/20 0826 10/13/20 0843  AST 47* 42* 52*  ALT 35 29 39*  ALKPHOS 98  --   --   BILITOT 1.6* 1.3* 1.4*  PROT 6.8 6.2 6.3  ALBUMIN 3.4*  --   --    No results for input(s): LIPASE, AMYLASE in the last 8760 hours. No results for input(s): AMMONIA in the last 8760 hours. CBC: Recent Labs    04/10/20 1145 05/12/20 0948 06/10/20 0826 10/13/20 0843  WBC 4.2 3.9* 3.2* 2.8*  NEUTROABS 2.7  --  1,690 1,481*  HGB 13.3 13.5 13.3 13.5  HCT 41.3 40.7 38.0 40.8  MCV 95.8 93.0 91.3 91.9  PLT 70* 98.0* 92* 82*   Lipid Panel: Recent Labs    12/15/20 0950  CHOL 168  HDL 85  LDLCALC 70  TRIG 71  CHOLHDL 2.0   TSH: Recent Labs    10/13/20 0843  TSH 2.78   A1C: Lab Results  Component Value Date   HGBA1C 9.1 (H) 02/11/2021     Assessment/Plan  1. Need for influenza vaccination  - Flu Vaccine QUAD High Dose(Fluad)      3. Type 1 diabetes mellitus with diabetic nephropathy (HCC) A1c remains elevated but I think it is better for her to have elevated sugar than low blood sugars which she has indicated recently.  Foot exam was fairly normal and while she does have some pain do not think she warrants treatment     Alain Honey, MD [facility] [phone]

## 2021-03-11 ENCOUNTER — Encounter (HOSPITAL_COMMUNITY): Payer: Self-pay | Admitting: *Deleted

## 2021-03-11 ENCOUNTER — Other Ambulatory Visit: Payer: Self-pay

## 2021-03-11 ENCOUNTER — Emergency Department (HOSPITAL_COMMUNITY)
Admission: EM | Admit: 2021-03-11 | Discharge: 2021-03-11 | Disposition: A | Discharge status: Home / Self Care | Payer: Medicare Other | Attending: Emergency Medicine | Admitting: Emergency Medicine

## 2021-03-11 ENCOUNTER — Emergency Department (HOSPITAL_COMMUNITY): Payer: Medicare Other

## 2021-03-11 ENCOUNTER — Ambulatory Visit (INDEPENDENT_AMBULATORY_CARE_PROVIDER_SITE_OTHER)
Admission: EM | Admit: 2021-03-11 | Discharge: 2021-03-11 | Disposition: A | Discharge status: Home / Self Care | Payer: Medicare Other | Source: Home / Self Care

## 2021-03-11 DIAGNOSIS — Z8616 Personal history of COVID-19: Secondary | ICD-10-CM | POA: Insufficient documentation

## 2021-03-11 DIAGNOSIS — I130 Hypertensive heart and chronic kidney disease with heart failure and stage 1 through stage 4 chronic kidney disease, or unspecified chronic kidney disease: Secondary | ICD-10-CM | POA: Diagnosis not present

## 2021-03-11 DIAGNOSIS — E1121 Type 2 diabetes mellitus with diabetic nephropathy: Secondary | ICD-10-CM | POA: Diagnosis not present

## 2021-03-11 DIAGNOSIS — Z79899 Other long term (current) drug therapy: Secondary | ICD-10-CM | POA: Diagnosis not present

## 2021-03-11 DIAGNOSIS — R079 Chest pain, unspecified: Secondary | ICD-10-CM

## 2021-03-11 DIAGNOSIS — N181 Chronic kidney disease, stage 1: Secondary | ICD-10-CM | POA: Diagnosis not present

## 2021-03-11 DIAGNOSIS — I25119 Atherosclerotic heart disease of native coronary artery with unspecified angina pectoris: Secondary | ICD-10-CM | POA: Diagnosis not present

## 2021-03-11 DIAGNOSIS — I509 Heart failure, unspecified: Secondary | ICD-10-CM | POA: Insufficient documentation

## 2021-03-11 DIAGNOSIS — R0789 Other chest pain: Secondary | ICD-10-CM | POA: Insufficient documentation

## 2021-03-11 DIAGNOSIS — Z91040 Latex allergy status: Secondary | ICD-10-CM | POA: Diagnosis not present

## 2021-03-11 DIAGNOSIS — R110 Nausea: Secondary | ICD-10-CM | POA: Insufficient documentation

## 2021-03-11 DIAGNOSIS — Z794 Long term (current) use of insulin: Secondary | ICD-10-CM | POA: Insufficient documentation

## 2021-03-11 DIAGNOSIS — Z7984 Long term (current) use of oral hypoglycemic drugs: Secondary | ICD-10-CM | POA: Insufficient documentation

## 2021-03-11 DIAGNOSIS — Z85828 Personal history of other malignant neoplasm of skin: Secondary | ICD-10-CM | POA: Diagnosis not present

## 2021-03-11 DIAGNOSIS — Z955 Presence of coronary angioplasty implant and graft: Secondary | ICD-10-CM | POA: Diagnosis not present

## 2021-03-11 DIAGNOSIS — R1013 Epigastric pain: Secondary | ICD-10-CM | POA: Diagnosis not present

## 2021-03-11 DIAGNOSIS — Z85048 Personal history of other malignant neoplasm of rectum, rectosigmoid junction, and anus: Secondary | ICD-10-CM | POA: Insufficient documentation

## 2021-03-11 LAB — BASIC METABOLIC PANEL
Anion gap: 10 (ref 5–15)
BUN: 20 mg/dL (ref 8–23)
CO2: 23 mmol/L (ref 22–32)
Calcium: 10 mg/dL (ref 8.9–10.3)
Chloride: 104 mmol/L (ref 98–111)
Creatinine, Ser: 0.78 mg/dL (ref 0.44–1.00)
GFR, Estimated: >60 mL/min (ref >60)
Glucose, Bld: 312 mg/dL — ABNORMAL HIGH (ref 70–99)
Potassium: 4.2 mmol/L (ref 3.5–5.1)
Sodium: 137 mmol/L (ref 135–145)

## 2021-03-11 LAB — HEPATIC FUNCTION PANEL
ALT: 45 U/L — ABNORMAL HIGH (ref 0–44)
AST: 60 U/L — ABNORMAL HIGH (ref 15–41)
Albumin: 3.1 g/dL — ABNORMAL LOW (ref 3.5–5.0)
Alkaline Phosphatase: 109 U/L (ref 38–126)
Bilirubin, Direct: 0.7 mg/dL — ABNORMAL HIGH (ref 0.0–0.2)
Indirect Bilirubin: 1.5 mg/dL — ABNORMAL HIGH (ref 0.3–0.9)
Total Bilirubin: 2.2 mg/dL — ABNORMAL HIGH (ref 0.3–1.2)
Total Protein: 6.8 g/dL (ref 6.5–8.1)

## 2021-03-11 LAB — CBC
HCT: 42 % (ref 36.0–46.0)
Hemoglobin: 13.8 g/dL (ref 12.0–15.0)
MCH: 31.4 pg (ref 26.0–34.0)
MCHC: 32.9 g/dL (ref 30.0–36.0)
MCV: 95.5 fL (ref 80.0–100.0)
Platelets: 81 10*3/uL — ABNORMAL LOW (ref 150–400)
RBC: 4.4 MIL/uL (ref 3.87–5.11)
RDW: 13 % (ref 11.5–15.5)
WBC: 6.5 10*3/uL (ref 4.0–10.5)
nRBC: 0 % (ref 0.0–0.2)

## 2021-03-11 LAB — TROPONIN I (HIGH SENSITIVITY)
Troponin I (High Sensitivity): 14 ng/L (ref <18)
Troponin I (High Sensitivity): 17 ng/L (ref <18)

## 2021-03-11 LAB — LIPASE, BLOOD: Lipase: 26 U/L (ref 11–51)

## 2021-03-11 MED ORDER — SODIUM CHLORIDE 0.9 % IV BOLUS
1000.0000 mL | Freq: Once | INTRAVENOUS | Status: AC
  Administered 2021-03-11: 1000 mL via INTRAVENOUS

## 2021-03-11 MED ORDER — SUCRALFATE 1 GM/10ML PO SUSP
1.0000 g | Freq: Once | ORAL | Status: AC
  Administered 2021-03-11: 1 g via ORAL
  Filled 2021-03-11 (×2): qty 10

## 2021-03-11 MED ORDER — FAMOTIDINE IN NACL 20-0.9 MG/50ML-% IV SOLN
20.0000 mg | Freq: Once | INTRAVENOUS | Status: AC
  Administered 2021-03-11: 20 mg via INTRAVENOUS
  Filled 2021-03-11: qty 50

## 2021-03-11 MED ORDER — FAMOTIDINE 20 MG PO TABS: 20.0000 mg | ORAL_TABLET | Freq: Two times a day (BID) | ORAL | 0 refills | Status: DC

## 2021-03-11 MED ORDER — SUCRALFATE 1 G PO TABS: 1.0000 g | ORAL_TABLET | Freq: Three times a day (TID) | ORAL | 0 refills | Status: DC

## 2021-03-11 NOTE — ED Provider Notes (Signed)
[facility]    CSN: [id number] Arrival date & time: 03/11/21  1120      History   Chief Complaint Chief Complaint  Patient presents with   Chest Pain    HPI [name] is a 75 y.o. female.   Patient here today for  day history of substernal chest pressure. She does not report shortness of breath but has had significant fatigue. She reports pressure feels different from her typical indigestion. She has tried to treat with Nitro without significant relief.   The history is provided by the patient.  Chest Pain Associated symptoms: no abdominal pain, no fever, no nausea, no shortness of breath and no vomiting    Past Medical History:  Diagnosis Date   A-fib ([place])    08/19/19 afib with RVR, spontaneously converted to SR   Allergy    Arthritis    neck   Cataract    bilateral - MD monitoring cataracts   CHF (congestive heart failure) (HCC)    Chronic kidney disease, stage I    DR OTTELIN  HX UTIS   Cirrhosis ([place])    Coronary artery disease    s/p CABG 01/29/16: LIMA-LAD, SVG-RI, SVG-dRCA   COVID-19 virus infection 04/2020   Cramp of limb    Diabetes mellitus    Dysphagia, unspecified(787.20)    Dysuria    Epistaxis    GERD (gastroesophageal reflux disease)    Hearing loss    wears hearing aids   Heart murmur     DX FOR YEARS ASYMPTOMATIC   Hepatic encephalopathy    Lumbago    Myocardial infarction ([place])    NSTEMI 01/27/16   Neoplasm of uncertain behavior of skin    Nonspecific elevation of levels of transaminase or lactic acid dehydrogenase (LDH)    Osteoarthrosis, unspecified whether generalized or localized, unspecified site    Other and unspecified hyperlipidemia    diet controlled   Pain in joint, shoulder region    Paresthesias 04/01/2015   Postablative ovarian failure    Trochanteric bursitis of left hip 12/15/2015   Type 2 diabetes mellitus without complication ([place])    Unspecified essential hypertension    no meds   Wears  glasses     Patient Active Problem List   Diagnosis Date Noted   Type 1 diabetes mellitus with diabetic nephropathy ([place]) 02/17/2021   Lymphopenia 11/05/2020   CHF (congestive heart failure) (HCC)    Coronary artery disease    Type II diabetes mellitus ([place])    Type 2 diabetes mellitus without complication ([place])    [id number] virus infection 04/2020   Atrial fibrillation with rapid ventricular response ([place]) 08/19/2019   S/P TIPS (transjugular intrahepatic portosystemic shunt)    Hypercalcemia 05/26/2019   AKI (acute kidney injury) ([place])    Hyponatremia    Acute lower UTI 05/16/2019   Acute delirium    General weakness 05/15/2019   Encephalopathy, hepatic 05/15/2019   Volume depletion 05/15/2019   Bleeding esophageal varices (HCC)    UGI bleed    Infarction of spleen 04/06/2019   Odynophagia 04/01/2019   Dysphagia    Hepatoma ([place]) 01/31/2019   Coronary artery disease of native artery of native heart with stable angina pectoris ([place]) 01/31/2019   Thrombocytopenia ([place]) 09/25/2017   Left forearm pain 05/29/2017   Portal vein thrombosis 04/22/2017   Skin cancer of nose 08/23/2016   History of colonic polyps    Benign neoplasm of rectum    Esophageal varices without bleeding (  [place])    Portal hypertensive gastropathy ([place])    Leg cramps 05/18/2016   Routine lab draw 05/13/2016   Iron deficiency anemia 05/09/2016   Statin-induced rhabdomyolysis 05/07/2016   Liver cirrhosis ([place]) 04/05/2016   PVC's (premature ventricular contractions) 04/05/2016   CAD (coronary artery disease) 02/16/2016   S/P CABG x 3 02/02/2016   History of non-ST elevation myocardial infarction (NSTEMI) 01/27/2016   DM (diabetes mellitus), type 2, uncontrolled 12/15/2015   Trochanteric bursitis of left hip 12/15/2015   S/P lumbar spinal fusion 06/10/2015   Paresthesia 04/01/2015   Osteoporosis 03/31/2015   Hearing loss 06/25/2014   Insomnia 06/25/2014   Hair thinning 06/25/2014   Muscle spasm  10/08/2013   Biceps tendon rupture, proximal 11/24/2012   Hyperlipemia    GERD (gastroesophageal reflux disease)    Essential hypertension     Past Surgical History:  Procedure Laterality Date   BACK SURGERY  06/2015   BREAST BIOPSY Left 12/18/2019   CARDIAC CATHETERIZATION N/A 01/27/2016   Procedure: Left Heart Cath and Coronary Angiography;  Surgeon: Belva Crome, MD;  Location: [facility];  Service: Cardiovascular;  Laterality: N/A;   COLONOSCOPY  2012   Dr Lajoyce Corners.    COLONOSCOPY WITH PROPOFOL N/A 07/07/2016   Procedure: COLONOSCOPY WITH PROPOFOL;  Surgeon: [name], MD;  Location: [facility];  Service: Endoscopy;  Laterality: N/A;   CORONARY ARTERY BYPASS GRAFT N/A 01/28/2016   Procedure: CORONARY ARTERY BYPASS GRAFTING (CABG) x 3 USING RIGHT LEG GREATER SAPHENOUS VEIN GRAFT;  Surgeon: Melrose Nakayama, MD;  Location: [facility];  Service: Open Heart Surgery;  Laterality: N/A;   ENDOVEIN HARVEST OF GREATER SAPHENOUS VEIN Right 01/28/2016   Procedure: ENDOVEIN HARVEST OF GREATER SAPHENOUS VEIN;  Surgeon: Melrose Nakayama, MD;  Location: [facility];  Service: Open Heart Surgery;  Laterality: Right;   ESOPHAGEAL BANDING  03/28/2019   Procedure: ESOPHAGEAL BANDING;  Surgeon: [name], MD;  Location: [facility];  Service: Endoscopy;;   ESOPHAGEAL BANDING  04/07/2019   Procedure: ESOPHAGEAL BANDING;  Surgeon: Juanita Craver, MD;  Location: [facility];  Service: Endoscopy;;   ESOPHAGOGASTRODUODENOSCOPY N/A 04/07/2019   Procedure: ESOPHAGOGASTRODUODENOSCOPY (EGD);  Surgeon: Juanita Craver, MD;  Location: [facility] ENDOSCOPY;  Service: Endoscopy;  Laterality: N/A;   ESOPHAGOGASTRODUODENOSCOPY (EGD) WITH PROPOFOL N/A 07/07/2016   Procedure: ESOPHAGOGASTRODUODENOSCOPY (EGD) WITH PROPOFOL;  Surgeon: [name], MD;  Location: [facility];  Service: Endoscopy;  Laterality: N/A;   ESOPHAGOGASTRODUODENOSCOPY (EGD) WITH PROPOFOL N/A 03/28/2019   Procedure:  ESOPHAGOGASTRODUODENOSCOPY (EGD) WITH PROPOFOL;  Surgeon: [name], MD;  Location: [facility];  Service: Endoscopy;  Laterality: N/A;   HEMOSTASIS CLIP PLACEMENT  04/07/2019   Procedure: HEMOSTASIS CLIP PLACEMENT;  Surgeon: Juanita Craver, MD;  Location: [facility];  Service: Endoscopy;;   IR ANGIOGRAM SELECTIVE EACH ADDITIONAL VESSEL  04/08/2019   IR EMBO ART  VEN HEMORR LYMPH EXTRAV  INC GUIDE ROADMAPPING  04/08/2019   IR PARACENTESIS  04/08/2019   IR RADIOLOGIST EVAL & MGMT  06/13/2019   IR RADIOLOGIST EVAL & MGMT  09/25/2019   IR RADIOLOGIST EVAL & MGMT  07/07/2020   IR TIPS  04/08/2019   MAXIMUM ACCESS (MAS)POSTERIOR LUMBAR INTERBODY FUSION (PLIF) 1 LEVEL Left 06/10/2015   Procedure: FOR MAXIMUM ACCESS (MAS) POSTERIOR LUMBAR INTERBODY FUSION (PLIF) LUMBAR THREE-FOUR EXTRAFORAMINAL MICRODISCECTOMY LUMBAR FIVE-SACRAL ONE LEFT;  Surgeon: Eustace Moore, MD;  Location: [facility];  Service: Neurosurgery;  Laterality: Left;   RADIOLOGY WITH ANESTHESIA N/A 04/08/2019  Procedure: RADIOLOGY WITH ANESTHESIA;  Surgeon: Radiologist, Medication, MD;  Location: [facility];  Service: Radiology;  Laterality: N/A;   SCLEROTHERAPY  04/07/2019   Procedure: SCLEROTHERAPY;  Surgeon: Juanita Craver, MD;  Location: [facility] ENDOSCOPY;  Service: Endoscopy;;   TEE WITHOUT CARDIOVERSION N/A 01/28/2016   Procedure: TRANSESOPHAGEAL ECHOCARDIOGRAM (TEE);  Surgeon: Melrose Nakayama, MD;  Location: [facility];  Service: Open Heart Surgery;  Laterality: N/A;   TUBAL LIGATION  1982   Dr Lynwood Dawley HERNIA REPAIR N/A 09/10/2019   Procedure: HERNIA REPAIR UMBILICAL ADULT;  Surgeon: Donnie Mesa, MD;  Location: [facility];  Service: General;  Laterality: N/A;   UPPER GASTROINTESTINAL ENDOSCOPY     VAGINAL HYSTERECTOMY  1997   Dr Rande Lawman    OB History   No obstetric history on file.      Home Medications    Prior to Admission medications   Medication Sig Start Date End Date Taking? Authorizing Provider   acetaminophen (TYLENOL) 500 MG tablet Take 500 mg by mouth at bedtime.    [provider]  Biotin 10000 MCG TABS Take 10,000 mcg by mouth every morning.    [provider]  Calcium Carbonate-Vit D-Min (CALCIUM 1200 PO) Take by mouth.    [provider]  Cholecalciferol (VITAMIN D-3 PO) Take 1,000 Units by mouth.    [provider]  Cyanocobalamin (VITAMIN B 12 PO) Take 1,000 mcg by mouth daily.    [provider]  empagliflozin (JARDIANCE) 25 MG TABS tablet Take 1 tablet (25 mg total) by mouth daily. 01/26/21   [name], MD  ezetimibe (ZETIA) 10 MG tablet Take 1 tablet (10 mg total) by mouth daily. 01/26/21   [name], MD  furosemide (LASIX) 20 MG tablet Take 1 tablet (20 mg total) by mouth daily. 06/11/20 01/13/21  Kathyrn Drown D, NP  glucose blood test strip One Touch Ultra strips. Use to test blood sugar three times daily. Dx: E11.65 12/03/20   [name], MD  Insulin Pen Needle (BD PEN NEEDLE NANO U/F) 32G X 4 MM MISC USE THREE TIMES DAILY AS DIRECTED 10/14/19   Reed, Tiffany L, DO  Insulin Syringe-Needle U-100 (INSULIN SYRINGE 1CC/31GX5/16") 31G X 5/16" 1 ML MISC USE AS DIRECTED TO INJECT LEVIMIR 07/29/20   [name], MD  LEVEMIR FLEXTOUCH 100 UNIT/ML FlexPen ADMINISTER 30 UNITS UNDER THE SKIN AT BEDTIME 03/25/20   Reed, Tiffany L, DO  MAGNESIUM PO Take 500 mg by mouth daily.     [provider]  metoprolol tartrate (LOPRESSOR) 25 MG tablet Take 1 tablet (25 mg total) by mouth as needed (use as needed for HR greater than 110). 08/21/19   Tommie Raymond, NP  Multiple Vitamins-Minerals (MULTIVITAMIN WITH MINERALS) tablet Take 1 tablet by mouth daily.    [provider]  Omega-3 Fatty Acids (FISH OIL PO) Take 1 capsule by mouth daily.    [provider]  pantoprazole (PROTONIX) 40 MG tablet TAKE 1 TABLET(40 MG) BY MOUTH DAILY 12/14/20   [name], MD  Polyethyl Glycol-Propyl Glycol (SYSTANE  OP) Place 1 drop into both eyes daily.     [provider]  Probiotic Product (PROBIOTIC DAILY PO) Take 1 capsule by mouth daily. Digestive Advantage Probiotic    [provider]  XIFAXAN 550 MG TABS tablet Take 1 tablet (550 mg total) by mouth 2 (two) times daily. 08/05/20   [name], MD    Family History Family History  Problem Relation Age of Onset   Heart disease Mother    Breast cancer Mother    Lung cancer Father    Arthritis Sister    Arthritis Brother    Liver cancer Brother    Breast cancer Maternal Aunt    Breast cancer Paternal Aunt    Heart disease Maternal Grandmother    Heart disease Maternal Grandfather    Heart disease Paternal Grandmother    Heart disease Paternal Grandfather    Other Daughter        house fire   Colon cancer Neg Hx    Esophageal cancer Neg Hx    Rectal cancer Neg Hx    Stomach cancer Neg Hx     Social History Social History   Tobacco Use   Smoking status: Never   Smokeless tobacco: Never  Vaping Use   Vaping Use: Never used  Substance Use Topics   Alcohol use: No   Drug use: No     Allergies   Kiwi extract, Tdap [tetanus-diphth-acell pertussis], Statins, Latex, and Tramadol   Review of Systems Review of Systems  Constitutional:  Negative for chills and fever.  Eyes:  Negative for discharge and redness.  Respiratory:  Negative for shortness of breath.   Cardiovascular:  Positive for chest pain.  Gastrointestinal:  Negative for abdominal pain, nausea and vomiting.  Genitourinary:  Positive for vaginal bleeding and vaginal discharge.    Physical Exam Triage Vital Signs ED Triage Vitals [03/11/21 1132]  Enc Vitals Group     BP (!) 148/64     Pulse Rate 73     Resp 18     Temp 98.3 F (36.8 C)     Temp Source Oral     SpO2 96 %     Weight      Height      Head Circumference      Peak Flow      Pain Score 4     Pain Loc      Pain Edu?      Excl. in [place]?    No data found.  Updated Vital  Signs BP (!) 148/64 (BP Location: Left Arm)   Pulse 73   Temp 98.3 F (36.8 C) (Oral)   Resp 18   LMP  (LMP Unknown)   SpO2 96%   Physical Exam Vitals and nursing note reviewed.  Constitutional:      General: She is not in acute distress.    Appearance: Normal appearance. She is not ill-appearing.  HENT:     Head: Normocephalic and atraumatic.  Eyes:     Conjunctiva/sclera: Conjunctivae normal.  Cardiovascular:     Rate and Rhythm: Normal rate and regular rhythm.     Heart sounds: Normal heart sounds.  Pulmonary:     Effort: Pulmonary effort is normal. No respiratory distress.     Breath sounds: Normal breath sounds. No wheezing, rhonchi or rales.  Neurological:     Mental Status: She is alert.  Psychiatric:        Mood and Affect: Mood normal.        Behavior: Behavior normal.        Thought Content: Thought content normal.     UC Treatments / Results  Labs (all labs ordered are listed, but only abnormal results are displayed) Labs Reviewed - No data to display  EKG   Radiology No results found.  Procedures Procedures (including critical care time)  Medications Ordered in UC Medications -  No data to display  Initial Impression / Assessment and Plan / UC Course  I have reviewed the triage vital signs and the nursing notes.  Pertinent labs & imaging results that were available during my care of the patient were reviewed by me and considered in my medical decision making (see chart for details).   EKG with nonspecific ST changes-- given significant cardiac history recommended further evaluation in the ED. Husband will transport patient to ED as soon as they leave this office.   Final Clinical Impressions(s) / UC Diagnoses   Final diagnoses:  Chest pain, unspecified type   Discharge Instructions   None    ED Prescriptions   None    PDMP not reviewed this encounter.   Francene Finders, PA-C 03/11/21 1150

## 2021-03-11 NOTE — ED Triage Notes (Signed)
Pt c/o center chest pressure x2 days. States took nitro last night with no relief. States no energy today. Denies SOB, diaphoresis, or pain radiating. States has had indigestion bad yesterday but this feels different.

## 2021-03-11 NOTE — ED Notes (Signed)
Patient is being discharged from the Urgent Care and sent to the Emergency Department via POV . Per Myers,PA, patient is in need of higher level of care due to further evaluation. Patient is aware and verbalizes understanding of plan of care.  Vitals:   03/11/21 1132  BP: (!) 148/64  Pulse: 73  Resp: 18  Temp: 98.3 F (36.8 C)  SpO2: 96%

## 2021-03-11 NOTE — ED Triage Notes (Signed)
Pt sent here from ucc, reports onset yesterday of mid chest pressure that she thought was indigestion. Had minimal relief with tums and nitro last night but states she "just doesn't feel well" denies sob. No acute distress noted at triage.

## 2021-03-11 NOTE — Discharge Instructions (Signed)
As discussed, your evaluation today has been largely reassuring.  But, it is important that you monitor your condition carefully, and do not hesitate to return to the ED if you develop new, or concerning changes in your condition. ? ?Otherwise, please follow-up with your physician for appropriate ongoing care. ? ?

## 2021-03-11 NOTE — ED Provider Notes (Addendum)
[facility] EMERGENCY DEPARTMENT Provider Note   CSN: [id number] Arrival date & time: 03/11/21  1213     History Chief Complaint  Patient presents with   Chest Pain    [name] is a 75 y.o. female.  HPI Patient with multiple medical issues including prior CABG now presents with epigastric pain.  Pain is focally in epigastrium/xiphoid, nonradiating, 4/10 though with waxing, waning severity.  There is associated nausea, and indigestion-like sensation.  No other chest pain, no other abdominal pain, no syncope, no dyspnea.  She tried Tums for relief had improvement, tried nitroglycerin without change. Onset was yesterday, gradual. She is here with her husband who assists with the history.    Past Medical History:  Diagnosis Date   A-fib ([place])    08/19/19 afib with RVR, spontaneously converted to SR   Allergy    Arthritis    neck   Cataract    bilateral - MD monitoring cataracts   CHF (congestive heart failure) (HCC)    Chronic kidney disease, stage I    DR OTTELIN  HX UTIS   Cirrhosis ([place])    Coronary artery disease    s/p CABG 01/29/16: LIMA-LAD, SVG-RI, SVG-dRCA   COVID-19 virus infection 04/2020   Cramp of limb    Diabetes mellitus    Dysphagia, unspecified(787.20)    Dysuria    Epistaxis    GERD (gastroesophageal reflux disease)    Hearing loss    wears hearing aids   Heart murmur     DX FOR YEARS ASYMPTOMATIC   Hepatic encephalopathy    Lumbago    Myocardial infarction ([place])    NSTEMI 01/27/16   Neoplasm of uncertain behavior of skin    Nonspecific elevation of levels of transaminase or lactic acid dehydrogenase (LDH)    Osteoarthrosis, unspecified whether generalized or localized, unspecified site    Other and unspecified hyperlipidemia    diet controlled   Pain in joint, shoulder region    Paresthesias 04/01/2015   Postablative ovarian failure    Trochanteric bursitis of left hip 12/15/2015   Type 2 diabetes mellitus without  complication ([place])    Unspecified essential hypertension    no meds   Wears glasses     Patient Active Problem List   Diagnosis Date Noted   Type 1 diabetes mellitus with diabetic nephropathy ([place]) 02/17/2021   Lymphopenia 11/05/2020   CHF (congestive heart failure) (HCC)    Coronary artery disease    Type II diabetes mellitus ([place])    Type 2 diabetes mellitus without complication ([place])    ESPQZ-30 virus infection 04/2020   Atrial fibrillation with rapid ventricular response ([place]) 08/19/2019   S/P TIPS (transjugular intrahepatic portosystemic shunt)    Hypercalcemia 05/26/2019   AKI (acute kidney injury) ([place])    Hyponatremia    Acute lower UTI 05/16/2019   Acute delirium    General weakness 05/15/2019   Encephalopathy, hepatic 05/15/2019   Volume depletion 05/15/2019   Bleeding esophageal varices (HCC)    UGI bleed    Infarction of spleen 04/06/2019   Odynophagia 04/01/2019   Dysphagia    Hepatoma ([place]) 01/31/2019   Coronary artery disease of native artery of native heart with stable angina pectoris ([place]) 01/31/2019   Thrombocytopenia ([place]) 09/25/2017   Left forearm pain 05/29/2017   Portal vein thrombosis 04/22/2017   Skin cancer of nose 08/23/2016   History of colonic polyps    Benign neoplasm of rectum    Esophageal varices  without bleeding ([place])    Portal hypertensive gastropathy ([place])    Leg cramps 05/18/2016   Routine lab draw 05/13/2016   Iron deficiency anemia 05/09/2016   Statin-induced rhabdomyolysis 05/07/2016   Liver cirrhosis ([place]) 04/05/2016   PVC's (premature ventricular contractions) 04/05/2016   CAD (coronary artery disease) 02/16/2016   S/P CABG x 3 02/02/2016   History of non-ST elevation myocardial infarction (NSTEMI) 01/27/2016   DM (diabetes mellitus), type 2, uncontrolled 12/15/2015   Trochanteric bursitis of left hip 12/15/2015   S/P lumbar spinal fusion 06/10/2015   Paresthesia 04/01/2015   Osteoporosis 03/31/2015   Hearing loss  06/25/2014   Insomnia 06/25/2014   Hair thinning 06/25/2014   Muscle spasm 10/08/2013   Biceps tendon rupture, proximal 11/24/2012   Hyperlipemia    GERD (gastroesophageal reflux disease)    Essential hypertension     Past Surgical History:  Procedure Laterality Date   BACK SURGERY  06/2015   BREAST BIOPSY Left 12/18/2019   CARDIAC CATHETERIZATION N/A 01/27/2016   Procedure: Left Heart Cath and Coronary Angiography;  Surgeon: Belva Crome, MD;  Location: [place] [facility];  Service: Cardiovascular;  Laterality: N/A;   COLONOSCOPY  2012   Dr Lajoyce Corners.    COLONOSCOPY WITH PROPOFOL N/A 07/07/2016   Procedure: COLONOSCOPY WITH PROPOFOL;  Surgeon: [name], MD;  Location: [facility];  Service: Endoscopy;  Laterality: N/A;   CORONARY ARTERY BYPASS GRAFT N/A 01/28/2016   Procedure: CORONARY ARTERY BYPASS GRAFTING (CABG) x 3 USING RIGHT LEG GREATER SAPHENOUS VEIN GRAFT;  Surgeon: Melrose Nakayama, MD;  Location: [place];  Service: Open Heart Surgery;  Laterality: N/A;   ENDOVEIN HARVEST OF GREATER SAPHENOUS VEIN Right 01/28/2016   Procedure: ENDOVEIN HARVEST OF GREATER SAPHENOUS VEIN;  Surgeon: Melrose Nakayama, MD;  Location: [place];  Service: Open Heart Surgery;  Laterality: Right;   ESOPHAGEAL BANDING  03/28/2019   Procedure: ESOPHAGEAL BANDING;  Surgeon: [name], MD;  Location: [facility];  Service: Endoscopy;;   ESOPHAGEAL BANDING  04/07/2019   Procedure: ESOPHAGEAL BANDING;  Surgeon: Juanita Craver, MD;  [facility] ENDOS[facility];  Service: Endoscopy;;   ESOPHAGOGASTRODUODENOSCOPY N/A 04/07/2019   Procedure: ESOPHAGOGASTRODUODENOSCOPY (EGD);  Surgeon: Juanita Craver, MD;  Location: [facility] ENDOSCOPY;  Service: Endoscopy;  Laterality: N/A;   ESOPHAGOGASTRODUODENOSCOPY (EGD) WITH PROPOFOL N/A 07/07/2016   Procedure: ESOPHAGOGASTRODUODENOSCOPY (EGD) WITH PROPOFOL;  Surgeon: [name], MD;  [facility];  Service: Endoscopy;  Laterality: N/A;    ESOPHAGOGASTRODUODENOSCOPY (EGD) WITH PROPOFOL N/A 03/28/2019   Procedure: ESOPHAGOGASTRODUODENOSCOPY (EGD) WITH PROPOFOL;  Surgeon: [name], MD;  [facility];  Service: Endoscopy;  Laterality: N/A;   HEMOSTASIS CLIP PLACEMENT  04/07/2019   Procedure: HEMOSTASIS CLIP PLACEMENT;  Surgeon: Juanita Craver, MD;  Location: [facility] ENDOSCOPY;  Service: Endoscopy;;   IR ANGIOGRAM SELECTIVE EACH ADDITIONAL VESSEL  04/08/2019   IR EMBO ART  VEN HEMORR LYMPH EXTRAV  INC GUIDE ROADMAPPING  04/08/2019   IR PARACENTESIS  04/08/2019   IR RADIOLOGIST EVAL & MGMT  06/13/2019   IR RADIOLOGIST EVAL & MGMT  09/25/2019   IR RADIOLOGIST EVAL & MGMT  07/07/2020   IR TIPS  04/08/2019   MAXIMUM ACCESS (MAS)POSTERIOR LUMBAR INTERBODY FUSION (PLIF) 1 LEVEL Left 06/10/2015   Procedure: FOR MAXIMUM ACCESS (MAS) POSTERIOR LUMBAR INTERBODY FUSION (PLIF) LUMBAR THREE-FOUR EXTRAFORAMINAL MICRODISCECTOMY LUMBAR FIVE-SACRAL ONE LEFT;  Surgeon: Eustace Moore, MD;  Location: [facility] NEURO ORS;  Service: Neurosurgery;  Laterality: Left;   RADIOLOGY WITH ANESTHESIA N/A  04/08/2019   Procedure: RADIOLOGY WITH ANESTHESIA;  Surgeon: Radiologist, Medication, MD;  Location: [place];  Service: Radiology;  Laterality: N/A;   SCLEROTHERAPY  04/07/2019   Procedure: SCLEROTHERAPY;  Surgeon: Juanita Craver, MD;  Location: [facility] ENDOSCOPY;  Service: Endoscopy;;   TEE WITHOUT CARDIOVERSION N/A 01/28/2016   Procedure: TRANSESOPHAGEAL ECHOCARDIOGRAM (TEE);  Surgeon: Melrose Nakayama, MD;  Location: [place];  Service: Open Heart Surgery;  Laterality: N/A;   TUBAL LIGATION  1982   Dr Lynwood Dawley HERNIA REPAIR N/A 09/10/2019   Procedure: HERNIA REPAIR UMBILICAL ADULT;  Surgeon: Donnie Mesa, MD;  Location: [place];  Service: General;  Laterality: N/A;   UPPER GASTROINTESTINAL ENDOSCOPY     VAGINAL HYSTERECTOMY  1997   Dr Rande Lawman     OB History   No obstetric history on file.     Family History  Problem Relation Age of Onset    Heart disease Mother    Breast cancer Mother    Lung cancer Father    Arthritis Sister    Arthritis Brother    Liver cancer Brother    Breast cancer Maternal Aunt    Breast cancer Paternal Aunt    Heart disease Maternal Grandmother    Heart disease Maternal Grandfather    Heart disease Paternal Grandmother    Heart disease Paternal Grandfather    Other Daughter        house fire   Colon cancer Neg Hx    Esophageal cancer Neg Hx    Rectal cancer Neg Hx    Stomach cancer Neg Hx     Social History   Tobacco Use   Smoking status: Never   Smokeless tobacco: Never  Vaping Use   Vaping Use: Never used  Substance Use Topics   Alcohol use: No   Drug use: No    Home Medications Prior to Admission medications   Medication Sig Start Date End Date Taking? Authorizing Provider  acetaminophen (TYLENOL) 500 MG tablet Take 500 mg by mouth at bedtime.   Yes [provider]  Biotin 10000 MCG TABS Take 10,000 mcg by mouth daily.   Yes [provider]  Calcium Carbonate-Vit D-Min (CALCIUM 1200 PO) Take 1 tablet by mouth daily.   Yes [provider]  Cholecalciferol (VITAMIN D-3 PO) Take 1,000 Units by mouth.   Yes [provider]  Cyanocobalamin (VITAMIN B 12 PO) Take 1,000 mcg by mouth daily.   Yes [provider]  empagliflozin (JARDIANCE) 25 MG TABS tablet Take 1 tablet (25 mg total) by mouth daily. 01/26/21  Yes [name], MD  ezetimibe (ZETIA) 10 MG tablet Take 1 tablet (10 mg total) by mouth daily. 01/26/21  Yes [name], MD  furosemide (LASIX) 20 MG tablet Take 1 tablet (20 mg total) by mouth daily. Patient taking differently: Take 20 mg by mouth daily as needed for fluid. 06/11/20 03/11/21 Yes Kathyrn Drown D, NP  LEVEMIR FLEXTOUCH 100 UNIT/ML FlexPen ADMINISTER 30 UNITS UNDER THE SKIN AT BEDTIME Patient taking differently: Inject 30 Units into the skin at bedtime. 03/25/20  Yes Reed, Tiffany L, DO  MAGNESIUM PO Take 500 mg by  mouth daily.    Yes [provider]  metoprolol tartrate (LOPRESSOR) 25 MG tablet Take 1 tablet (25 mg total) by mouth as needed (use as needed for HR greater than 110). Patient taking differently: Take 25 mg by mouth daily as needed (use as needed for HR greater than 110). 08/21/19  Yes  Tommie Raymond, NP  Multiple Vitamins-Minerals (MULTIVITAMIN WITH MINERALS) tablet Take 1 tablet by mouth daily.   Yes [provider]  Omega-3 Fatty Acids (FISH OIL PO) Take 1 capsule by mouth daily.   Yes [provider]  pantoprazole (PROTONIX) 40 MG tablet TAKE 1 TABLET(40 MG) BY MOUTH DAILY Patient taking differently: Take 40 mg by mouth daily. 12/14/20  Yes [name], MD  Polyethyl Glycol-Propyl Glycol (SYSTANE OP) Place 1 drop into both eyes daily.    Yes [provider]  Probiotic Product (PROBIOTIC DAILY PO) Take 1 capsule by mouth daily. Digestive Advantage Probiotic   Yes [provider]  XIFAXAN 550 MG TABS tablet Take 1 tablet (550 mg total) by mouth 2 (two) times daily. 08/05/20  Yes [name], MD  glucose blood test strip One Touch Ultra strips. Use to test blood sugar three times daily. Dx: E11.65 12/03/20   [name], MD  Insulin Pen Needle (BD PEN NEEDLE NANO U/F) 32G X 4 MM MISC USE THREE TIMES DAILY AS DIRECTED 10/14/19   Reed, Tiffany L, DO  Insulin Syringe-Needle U-100 (INSULIN SYRINGE 1CC/31GX5/16") 31G X 5/16" 1 ML MISC USE AS DIRECTED TO INJECT LEVIMIR 07/29/20   [name], MD    Allergies    Kiwi extract, Tdap [tetanus-diphth-acell pertussis], Statins, Latex, and Tramadol  Review of Systems   Review of Systems  Constitutional:        Per HPI, otherwise negative  HENT:         Per HPI, otherwise negative  Respiratory:         Per HPI, otherwise negative  Cardiovascular:        Per HPI, otherwise negative  Gastrointestinal:  Negative for vomiting.  Endocrine:       Negative aside from HPI  Genitourinary:         Neg aside from HPI   Musculoskeletal:        Per HPI, otherwise negative  Skin: Negative.   Neurological:  Negative for syncope.   Physical Exam Updated Vital Signs BP (!) 132/57   Pulse 70   Temp 98.7 F (37.1 C) (Oral)   Resp 17   LMP  (LMP Unknown)   SpO2 98%   Physical Exam Vitals and nursing note reviewed.  Constitutional:      General: She is not in acute distress.    Appearance: She is well-developed.  HENT:     Head: Normocephalic and atraumatic.  Eyes:     Conjunctiva/sclera: Conjunctivae normal.  Cardiovascular:     Rate and Rhythm: Normal rate and regular rhythm.  Pulmonary:     Effort: Pulmonary effort is normal. No respiratory distress.     Breath sounds: Normal breath sounds. No stridor.  Abdominal:     General: There is no distension.     Palpations: Abdomen is soft.     Tenderness: There is no abdominal tenderness. There is no guarding or rebound.  Skin:    General: Skin is warm and dry.  Neurological:     Mental Status: She is alert and oriented to person, place, and time.     Cranial Nerves: No cranial nerve deficit.    ED Results / Procedures / Treatments   Labs (all labs ordered are listed, but only abnormal results are displayed) Labs Reviewed  BASIC METABOLIC PANEL - Abnormal; Notable for the following components:      Result Value   Glucose, Bld 312 (*)  All other components within normal limits  CBC - Abnormal; Notable for the following components:   Platelets 81 (*)    All other components within normal limits  HEPATIC FUNCTION PANEL - Abnormal; Notable for the following components:   Albumin 3.1 (*)    AST 60 (*)    ALT 45 (*)    Total Bilirubin 2.2 (*)    Bilirubin, Direct 0.7 (*)    Indirect Bilirubin 1.5 (*)    All other components within normal limits  LIPASE, BLOOD  TROPONIN I (HIGH SENSITIVITY)  TROPONIN I (HIGH SENSITIVITY)    EKG Sinus rhythm, rate 73, nonspecific ST changes abnormal EKG.  Radiology DG Chest 2  View  Result Date: 03/11/2021 CLINICAL DATA:  Chest pain EXAM: CHEST - 2 VIEW COMPARISON:  05/26/2019 FINDINGS: The heart size and mediastinal contours are within normal limits. Post CABG. Both lungs are clear. No pleural effusion or pneumothorax. No acute osseous abnormality. IMPRESSION: No acute process in the chest. Electronically Signed   By: Macy Mis M.D.   On: 03/11/2021 13:20    Procedures Procedures   Medications Ordered in ED Medications  sodium chloride 0.9 % bolus 1,000 mL (1,000 mLs Intravenous New Bag/Given 03/11/21 1538)  famotidine (PEPCID) IVPB 20 mg premix (20 mg Intravenous New Bag/Given 03/11/21 1539)  sucralfate (CARAFATE) 1 GM/10ML suspension 1 g (1 g Oral Given 03/11/21 1534)    ED Course  I have reviewed the triage vital signs and the nursing notes.  Pertinent labs & imaging results that were available during my care of the patient were reviewed by me and considered in my medical decision making (see chart for details).   4:18 PM Cardiac 70 sinus normal Pulse ox 100% room air normal Initial labs reassuring, troponin value less than 20. MDM Rules/Calculators/A&P Adult female presents with chest pain.  Pain is about the epigastrium/xiphoid, nonradiating, sore.  Given her improvement with antacid, some suspicion for gastroesophageal etiology.  Patient is in no distress, has no hemodynamic stability.  Initial labs similar reassuring, troponin unremarkable.  Patient awaiting second troponin, with ongoing continuous cardiac and pulse oximetry monitoring.  4:46 PM And in no distress, states that she feels better.  We discussed all findings with her husband present.  Second troponin with delta less than 4, no ongoing pain, improvement with Pepcid, fluids here.  Some suspicion for gastroesophageal etiology as above, patient comfortable with primary care follow-up, understands return precautions as well. MDM Number of Diagnoses or Management Options Atypical chest pain:  new, needed workup   Amount and/or Complexity of Data Reviewed Clinical lab tests: ordered and reviewed Tests in the radiology section of CPT: ordered and reviewed Tests in the medicine section of CPT: reviewed and ordered Decide to obtain previous medical records or to obtain history from someone other than the patient: yes Obtain history from someone other than the patient: yes Review and summarize past medical records: yes Independent visualization of images, tracings, or specimens: yes  Risk of Complications, Morbidity, and/or Mortality Presenting problems: high Diagnostic procedures: high Management options: high  Critical Care Total time providing critical care: < 30 minutes  Patient Progress Patient progress: improved   Final Clinical Impression(s) / ED Diagnoses Final diagnoses:  Atypical chest pain     Carmin Muskrat, MD 03/11/21 1619    Carmin Muskrat, MD 03/11/21 1647

## 2021-03-12 ENCOUNTER — Ambulatory Visit: Payer: Medicare Other

## 2021-03-17 ENCOUNTER — Telehealth: Payer: Self-pay | Admitting: Pharmacy Technician

## 2021-03-17 DIAGNOSIS — Z596 Low income: Secondary | ICD-10-CM

## 2021-03-17 NOTE — Progress Notes (Signed)
[name])                                            [place] Team    03/17/2021  [name] November 21, 1945 [id number]  FOR 2023 RE ENROLLMENT                                      Medication Assistance Referral  Referral From: [place]  Medication/Company: [employer] / [name] Patient application portion:  Education officer, museum portion: Faxed  to Dr. Alain Honey Provider address/fax verified via: Office website  [name], [name]  [phone]

## 2021-03-18 ENCOUNTER — Ambulatory Visit (INDEPENDENT_AMBULATORY_CARE_PROVIDER_SITE_OTHER): Payer: Medicare Other

## 2021-03-18 ENCOUNTER — Other Ambulatory Visit: Payer: Self-pay

## 2021-03-18 DIAGNOSIS — M8588 Other specified disorders of bone density and structure, other site: Secondary | ICD-10-CM | POA: Diagnosis not present

## 2021-03-18 MED ORDER — DENOSUMAB 60 MG/ML ~~LOC~~ SOSY
60.0000 mg | PREFILLED_SYRINGE | Freq: Once | SUBCUTANEOUS | Status: AC
Start: 2021-03-18 — End: 2021-03-18
  Administered 2021-03-18: 60 mg via SUBCUTANEOUS

## 2021-03-22 ENCOUNTER — Other Ambulatory Visit: Payer: Self-pay | Admitting: *Deleted

## 2021-03-22 NOTE — Patient Outreach (Signed)
[facility] [facility]) Care Management  03/22/2021  Linnette Panella March 24, 1946 [id number]   Outgoing call placed to son [name], no answer, HIPAA compliant voice message left.  Will follow up within the next 3-4 business days.  [name], [place], MSN [place] [phone]

## 2021-03-24 ENCOUNTER — Telehealth: Payer: Self-pay | Admitting: *Deleted

## 2021-03-24 NOTE — Telephone Encounter (Signed)
Patient dropped off forms for Dr. Sabra Heck to review and sign for Patient Assistance from [name] for [employer].   Forms filled out and placed in Dr. Sanjuan Dame folder to review and sign. Attached a copy of patient's medication list.   Call patient once completed for pick up.

## 2021-03-25 ENCOUNTER — Other Ambulatory Visit: Payer: Self-pay | Admitting: *Deleted

## 2021-03-25 NOTE — Patient Outreach (Signed)
[facility]) Care Management  03/25/2021  Harrison Paulson 1946/03/08 [id number]   Outreach attempt #2, unsuccessful to son [name], HIPAA compliant voice message left.  Will send outreach letter and follow up within the next 3-4 business days.  [name], [place], MSN [place] [phone]

## 2021-03-26 NOTE — Telephone Encounter (Signed)
Form Completed and patient notified.  Patient will pick up to attach her Proof of Household income.  Envelope attached to send back to [facility] with [facility]. [phone] [phone]

## 2021-03-30 ENCOUNTER — Other Ambulatory Visit: Payer: Self-pay | Admitting: *Deleted

## 2021-03-30 NOTE — Patient Outreach (Signed)
[facility]) Care Management  03/30/2021  [name] Oct 24, 1945 [id number]   Outreach attempt #3, unsuccessful to member's son [name], HIPAA compliant voice message left.  Will follow up with 4th and final outreach within the next 4 weeks.  If remain unsuccessful, will close case due to inability to maintain contact.  [name], [place], MSN [place] [phone]

## 2021-04-05 ENCOUNTER — Other Ambulatory Visit: Payer: Self-pay

## 2021-04-05 ENCOUNTER — Telehealth: Payer: Self-pay

## 2021-04-05 MED ORDER — XIFAXAN 550 MG PO TABS: 550.0000 mg | ORAL_TABLET | Freq: Two times a day (BID) | ORAL | 3 refills | Status: DC

## 2021-04-05 NOTE — Telephone Encounter (Signed)
Phone call to patient to make her aware that patient assistance paper work for Doreene [employer] has been completed and left at front desk for patient to pick up.  Patient agreed to plan and verbalized understanding.   No further questions.

## 2021-04-06 ENCOUNTER — Telehealth: Payer: Self-pay | Admitting: Pharmacy Technician

## 2021-04-06 ENCOUNTER — Telehealth: Payer: Self-pay | Admitting: Gastroenterology

## 2021-04-06 DIAGNOSIS — Z596 Low income: Secondary | ICD-10-CM

## 2021-04-06 NOTE — Telephone Encounter (Signed)
Patient calling in regards to Patient assistance paperwork for her Xifaxan. She states that she filled out what she could, she requesting she speak with you to know what to do further. Please advise.

## 2021-04-06 NOTE — Telephone Encounter (Signed)
Left message for patient to return call. I have completed all of the documentation that Dr [name] is responsible for.  There is several sections of the paper work that needs to be filled out by the patient and signed by the patient.  She will need to return to the office to pick up the paper work so it can be completed in its entirety.  Will continue efforts.

## 2021-04-06 NOTE — Progress Notes (Signed)
[facility])                                            [place] Team    04/06/2021  [name] [birth year] [id number]  Received both patient and provider portion(s) of patient assistance application(s) for [employer]. Faxed completed application and required documents into [name].   [name] P. Cynthea Zachman, [place]  [phone]

## 2021-04-06 NOTE — Telephone Encounter (Signed)
Patient returned call and stated that she had picked up her paper work and put it in the mail today.  Patient aware that if there is anything else needed we would make her aware.  Patient agreed to plan and verbalized understanding.  No further questions.

## 2021-04-27 ENCOUNTER — Other Ambulatory Visit: Payer: Self-pay | Admitting: *Deleted

## 2021-04-27 NOTE — Patient Outreach (Signed)
[facility]) Care Management  04/27/2021  [place] Doig 09/02/1945 [id number]   Outreach attempt #4, unsuccessful, HIPAA compliant voice message left.  No response from member after multiple unsuccessful outreach attempts and letter sent.  Will close case at this time due to inability to maintain contact.  Will notify member and primary MD of case closure.  [name], [place], MSN [place] [phone]

## 2021-05-04 ENCOUNTER — Ambulatory Visit: Payer: Medicare Other | Admitting: Dermatology

## 2021-05-09 ENCOUNTER — Ambulatory Visit
Admission: EM | Admit: 2021-05-09 | Discharge: 2021-05-09 | Disposition: A | Discharge status: Home / Self Care | Payer: Medicare Other | Attending: Internal Medicine | Admitting: Internal Medicine

## 2021-05-09 ENCOUNTER — Encounter: Payer: Self-pay | Admitting: Emergency Medicine

## 2021-05-09 ENCOUNTER — Other Ambulatory Visit: Payer: Self-pay

## 2021-05-09 DIAGNOSIS — J069 Acute upper respiratory infection, unspecified: Secondary | ICD-10-CM

## 2021-05-09 MED ORDER — BENZONATATE 100 MG PO CAPS: 100.0000 mg | ORAL_CAPSULE | Freq: Three times a day (TID) | ORAL | 0 refills | Status: DC | PRN

## 2021-05-09 MED ORDER — FLUTICASONE PROPIONATE 50 MCG/ACT NA SUSP: 1.0000 | Freq: Every day | NASAL | 0 refills | Status: DC

## 2021-05-09 NOTE — ED Provider Notes (Signed)
[facility]    CSN: [id number] Arrival date & time: 05/09/21  0940      History   Chief Complaint Chief Complaint  Patient presents with   Cough    HPI Erionna Strum is a 76 y.o. female.   Patient presents with cough and nasal congestion that has been present for approximately 4 days.  Cough is productive but patient is not able to characterize color of sputum.  Denies any known fevers.  Husband has similar symptoms currently.  Patient denies chest pain, shortness of breath, sore throat, ear pain, nausea, vomiting, diarrhea, abdominal pain.  Patient has taken Mucinex and Tylenol with minimal improvement in symptoms.   Cough  Past Medical History:  Diagnosis Date   A-fib ([place])    08/19/19 afib with RVR, spontaneously converted to SR   Allergy    Arthritis    neck   Cataract    bilateral - MD monitoring cataracts   CHF (congestive heart failure) (HCC)    Chronic kidney disease, stage I    DR OTTELIN  HX UTIS   Cirrhosis ([place])    Coronary artery disease    s/p CABG 01/29/16: LIMA-LAD, SVG-RI, SVG-dRCA   COVID-19 virus infection 04/2020   Cramp of limb    Diabetes mellitus    Dysphagia, unspecified(787.20)    Dysuria    Epistaxis    GERD (gastroesophageal reflux disease)    Hearing loss    wears hearing aids   Heart murmur     DX FOR YEARS ASYMPTOMATIC   Hepatic encephalopathy    Lumbago    Myocardial infarction ([place])    NSTEMI 01/27/16   Neoplasm of uncertain behavior of skin    Nonspecific elevation of levels of transaminase or lactic acid dehydrogenase (LDH)    Osteoarthrosis, unspecified whether generalized or localized, unspecified site    Other and unspecified hyperlipidemia    diet controlled   Pain in joint, shoulder region    Paresthesias 04/01/2015   Postablative ovarian failure    Trochanteric bursitis of left hip 12/15/2015   Type 2 diabetes mellitus without complication ([place])    Unspecified essential hypertension    no meds    Wears glasses     Patient Active Problem List   Diagnosis Date Noted   Type 1 diabetes mellitus with diabetic nephropathy ([place]) 02/17/2021   Lymphopenia 11/05/2020   CHF (congestive heart failure) (HCC)    Coronary artery disease    Type II diabetes mellitus ([place])    Type 2 diabetes mellitus without complication ([place])    TMLYY-50 virus infection 04/2020   Atrial fibrillation with rapid ventricular response ([place]) 08/19/2019   S/P TIPS (transjugular intrahepatic portosystemic shunt)    Hypercalcemia 05/26/2019   AKI (acute kidney injury) ([place])    Hyponatremia    Acute lower UTI 05/16/2019   Acute delirium    General weakness 05/15/2019   Encephalopathy, hepatic 05/15/2019   Volume depletion 05/15/2019   Bleeding esophageal varices (HCC)    UGI bleed    Infarction of spleen 04/06/2019   Odynophagia 04/01/2019   Dysphagia    Hepatoma ([place]) 01/31/2019   Coronary artery disease of native artery of native heart with stable angina pectoris ([place]) 01/31/2019   Thrombocytopenia ([place]) 09/25/2017   Left forearm pain 05/29/2017   Portal vein thrombosis 04/22/2017   Skin cancer of nose 08/23/2016   History of colonic polyps    Benign neoplasm of rectum    Esophageal varices without bleeding ([place])  Portal hypertensive gastropathy (HCC)    Leg cramps 05/18/2016   Routine lab draw 05/13/2016   Iron deficiency anemia 05/09/2016   Statin-induced rhabdomyolysis 05/07/2016   Liver cirrhosis ([place]) 04/05/2016   PVC's (premature ventricular contractions) 04/05/2016   CAD (coronary artery disease) 02/16/2016   S/P CABG x 3 02/02/2016   History of non-ST elevation myocardial infarction (NSTEMI) 01/27/2016   DM (diabetes mellitus), type 2, uncontrolled 12/15/2015   Trochanteric bursitis of left hip 12/15/2015   S/P lumbar spinal fusion 06/10/2015   Paresthesia 04/01/2015   Osteoporosis 03/31/2015   Hearing loss 06/25/2014   Insomnia 06/25/2014   Hair thinning 06/25/2014   Muscle spasm  10/08/2013   Biceps tendon rupture, proximal 11/24/2012   Hyperlipemia    GERD (gastroesophageal reflux disease)    Essential hypertension     Past Surgical History:  Procedure Laterality Date   BACK SURGERY  06/2015   BREAST BIOPSY Left 12/18/2019   CARDIAC CATHETERIZATION N/A 01/27/2016   Procedure: Left Heart Cath and Coronary Angiography;  Surgeon: Belva Crome, MD;  Location: [place] [facility];  Service: Cardiovascular;  Laterality: N/A;   COLONOSCOPY  2012   Dr Lajoyce Corners.    COLONOSCOPY WITH PROPOFOL N/A 07/07/2016   Procedure: COLONOSCOPY WITH PROPOFOL;  Surgeon: [name], MD;  Location: [facility];  Service: Endoscopy;  Laterality: N/A;   CORONARY ARTERY BYPASS GRAFT N/A 01/28/2016   Procedure: CORONARY ARTERY BYPASS GRAFTING (CABG) x 3 USING RIGHT LEG GREATER SAPHENOUS VEIN GRAFT;  Surgeon: Melrose Nakayama, MD;  Location: [place];  Service: Open Heart Surgery;  Laterality: N/A;   ENDOVEIN HARVEST OF GREATER SAPHENOUS VEIN Right 01/28/2016   Procedure: ENDOVEIN HARVEST OF GREATER SAPHENOUS VEIN;  Surgeon: Melrose Nakayama, MD;  Location: [place];  Service: Open Heart Surgery;  Laterality: Right;   ESOPHAGEAL BANDING  03/28/2019   Procedure: ESOPHAGEAL BANDING;  Surgeon: [name], MD;  Location: [facility];  Service: Endoscopy;;   ESOPHAGEAL BANDING  04/07/2019   Procedure: ESOPHAGEAL BANDING;  Surgeon: Juanita Craver, MD;  Location: [facility];  Service: Endoscopy;;   ESOPHAGOGASTRODUODENOSCOPY N/A 04/07/2019   Procedure: ESOPHAGOGASTRODUODENOSCOPY (EGD);  Surgeon: Juanita Craver, MD;  Location: [facility] ENDOSCOPY;  Service: Endoscopy;  Laterality: N/A;   ESOPHAGOGASTRODUODENOSCOPY (EGD) WITH PROPOFOL N/A 07/07/2016   Procedure: ESOPHAGOGASTRODUODENOSCOPY (EGD) WITH PROPOFOL;  Surgeon: [name], MD;  Location: [facility];  Service: Endoscopy;  Laterality: N/A;   ESOPHAGOGASTRODUODENOSCOPY (EGD) WITH PROPOFOL N/A 03/28/2019   Procedure:  ESOPHAGOGASTRODUODENOSCOPY (EGD) WITH PROPOFOL;  Surgeon: [name], MD;  Location: [facility];  Service: Endoscopy;  Laterality: N/A;   HEMOSTASIS CLIP PLACEMENT  04/07/2019   Procedure: HEMOSTASIS CLIP PLACEMENT;  Surgeon: Juanita Craver, MD;  Location: [facility];  Service: Endoscopy;;   IR ANGIOGRAM SELECTIVE EACH ADDITIONAL VESSEL  04/08/2019   IR EMBO ART  VEN HEMORR LYMPH EXTRAV  INC GUIDE ROADMAPPING  04/08/2019   IR PARACENTESIS  04/08/2019   IR RADIOLOGIST EVAL & MGMT  06/13/2019   IR RADIOLOGIST EVAL & MGMT  09/25/2019   IR RADIOLOGIST EVAL & MGMT  07/07/2020   IR TIPS  04/08/2019   MAXIMUM ACCESS (MAS)POSTERIOR LUMBAR INTERBODY FUSION (PLIF) 1 LEVEL Left 06/10/2015   Procedure: FOR MAXIMUM ACCESS (MAS) POSTERIOR LUMBAR INTERBODY FUSION (PLIF) LUMBAR THREE-FOUR EXTRAFORAMINAL MICRODISCECTOMY LUMBAR FIVE-SACRAL ONE LEFT;  Surgeon: Eustace Moore, MD;  Location: [facility];  Service: Neurosurgery;  Laterality: Left;   RADIOLOGY WITH ANESTHESIA N/A 04/08/2019   Procedure: RADIOLOGY WITH  ANESTHESIA;  Surgeon: Radiologist, Medication, MD;  Location: [place];  Service: Radiology;  Laterality: N/A;   SCLEROTHERAPY  04/07/2019   Procedure: SCLEROTHERAPY;  Surgeon: Juanita Craver, MD;  Location: [facility] ENDOSCOPY;  Service: Endoscopy;;   TEE WITHOUT CARDIOVERSION N/A 01/28/2016   Procedure: TRANSESOPHAGEAL ECHOCARDIOGRAM (TEE);  Surgeon: Melrose Nakayama, MD;  Location: [place];  Service: Open Heart Surgery;  Laterality: N/A;   TUBAL LIGATION  1982   Dr Lynwood Dawley HERNIA REPAIR N/A 09/10/2019   Procedure: HERNIA REPAIR UMBILICAL ADULT;  Surgeon: Donnie Mesa, MD;  Location: [place];  Service: General;  Laterality: N/A;   UPPER GASTROINTESTINAL ENDOSCOPY     VAGINAL HYSTERECTOMY  1997   Dr Rande Lawman    OB History   No obstetric history on file.      Home Medications    Prior to Admission medications   Medication Sig Start Date End Date Taking? Authorizing Provider   acetaminophen (TYLENOL) 500 MG tablet Take 500 mg by mouth at bedtime.   Yes [provider]  benzonatate (TESSALON) 100 MG capsule Take 1 capsule (100 mg total) by mouth every 8 (eight) hours as needed for cough. 05/09/21  Yes , Hildred Alamin E, FNP  Biotin 10000 MCG TABS Take 10,000 mcg by mouth daily.   Yes [provider]  Calcium Carbonate-Vit D-Min (CALCIUM 1200 PO) Take 1 tablet by mouth daily.   Yes [provider]  Cholecalciferol (VITAMIN D-3 PO) Take 1,000 Units by mouth.   Yes [provider]  Cyanocobalamin (VITAMIN B 12 PO) Take 1,000 mcg by mouth daily.   Yes [provider]  empagliflozin (JARDIANCE) 25 MG TABS tablet Take 1 tablet (25 mg total) by mouth daily. 01/26/21  Yes [name], MD  ezetimibe (ZETIA) 10 MG tablet Take 1 tablet (10 mg total) by mouth daily. 01/26/21  Yes [name], MD  fluticasone [facility]) 50 MCG/ACT nasal spray Place 1 spray into both nostrils daily for 3 days. 05/09/21 05/12/21 Yes , Hildred Alamin E, FNP  glucose blood test strip One Touch Ultra strips. Use to test blood sugar three times daily. Dx: E11.65 12/03/20  Yes [name], MD  Insulin Pen Needle (BD PEN NEEDLE NANO U/F) 32G X 4 MM MISC USE THREE TIMES DAILY AS DIRECTED 10/14/19  Yes Reed, Tiffany L, DO  Insulin Syringe-Needle U-100 (INSULIN SYRINGE 1CC/31GX5/16") 31G X 5/16" 1 ML MISC USE AS DIRECTED TO INJECT LEVIMIR 07/29/20  Yes [name], MD  LEVEMIR FLEXTOUCH 100 UNIT/ML FlexPen ADMINISTER 30 UNITS UNDER THE SKIN AT BEDTIME Patient taking differently: Inject 30 Units into the skin at bedtime. 03/25/20  Yes Reed, Tiffany L, DO  MAGNESIUM PO Take 500 mg by mouth daily.    Yes [provider]  metoprolol tartrate (LOPRESSOR) 25 MG tablet Take 1 tablet (25 mg total) by mouth as needed (use as needed for HR greater than 110). Patient taking differently: Take 25 mg by mouth daily as needed (use as needed for HR greater than 110).  08/21/19  Yes Tommie Raymond, NP  Multiple Vitamins-Minerals (MULTIVITAMIN WITH MINERALS) tablet Take 1 tablet by mouth daily.   Yes [provider]  Omega-3 Fatty Acids (FISH OIL PO) Take 1 capsule by mouth daily.   Yes [provider]  pantoprazole (PROTONIX) 40 MG tablet TAKE 1 TABLET(40 MG) BY MOUTH DAILY Patient taking differently: Take 40 mg by mouth daily. 12/14/20  Yes [name], MD  Polyethyl Glycol-Propyl Glycol [facility]  OP) Place 1 drop into both eyes daily.    Yes [provider]  Probiotic Product (PROBIOTIC DAILY PO) Take 1 capsule by mouth daily. Digestive Advantage Probiotic   Yes [provider]  sucralfate (CARAFATE) 1 g tablet Take 1 tablet (1 g total) by mouth 4 (four) times daily -  with meals and at bedtime. 03/11/21  Yes Carmin Muskrat, MD  XIFAXAN 550 MG TABS tablet Take 1 tablet (550 mg total) by mouth 2 (two) times daily. 04/05/21  Yes [name], MD  famotidine (PEPCID) 20 MG tablet Take 1 tablet (20 mg total) by mouth 2 (two) times daily for 7 days. Take one tablet twice daily for two days 03/11/21 03/18/21  Carmin Muskrat, MD  furosemide (LASIX) 20 MG tablet Take 1 tablet (20 mg total) by mouth daily. Patient taking differently: Take 20 mg by mouth daily as needed for fluid. 06/11/20 03/11/21  Tommie Raymond, NP    Family History Family History  Problem Relation Age of Onset   Heart disease Mother    Breast cancer Mother    Lung cancer Father    Arthritis Sister    Arthritis Brother    Liver cancer Brother    Breast cancer Maternal Aunt    Breast cancer Paternal Aunt    Heart disease Maternal Grandmother    Heart disease Maternal Grandfather    Heart disease Paternal Grandmother    Heart disease Paternal Grandfather    Other Daughter        house fire   Colon cancer Neg Hx    Esophageal cancer Neg Hx    Rectal cancer Neg Hx    Stomach cancer Neg Hx     Social History Social History   Tobacco Use    Smoking status: Never   Smokeless tobacco: Never  Vaping Use   Vaping Use: Never used  Substance Use Topics   Alcohol use: No   Drug use: No     Allergies   Kiwi extract, Tdap [tetanus-diphth-acell pertussis], Statins, Latex, and Tramadol   Review of Systems Review of Systems Per HPI  Physical Exam Triage Vital Signs ED Triage Vitals [05/09/21 1031]  Enc Vitals Group     BP 132/67     Pulse Rate 63     Resp 20     Temp 98.5 F (36.9 C)     Temp Source Oral     SpO2 96 %     Weight 128 lb 8.5 oz (58.3 kg)     Height 5' 3"  (1.6 m)     Head Circumference      Peak Flow      Pain Score 0     Pain Loc      Pain Edu?      Excl. in [place]?    No data found.  Updated Vital Signs BP 132/67 (BP Location: Right Arm)    Pulse 63    Temp 98.5 F (36.9 C) (Oral)    Resp 20    Ht 5' 3"  (1.6 m)    Wt 128 lb 8.5 oz (58.3 kg)    LMP  (LMP Unknown)    SpO2 96%    BMI 22.77 kg/m   Visual Acuity Right Eye Distance:   Left Eye Distance:   Bilateral Distance:    Right Eye Near:   Left Eye Near:    Bilateral Near:     Physical Exam Constitutional:      General: She is  not in acute distress.    Appearance: Normal appearance. She is not toxic-appearing or diaphoretic.  HENT:     Head: Normocephalic and atraumatic.     Right Ear: Tympanic membrane and ear canal normal.     Left Ear: Tympanic membrane and ear canal normal.     Nose: Congestion present.     Mouth/Throat:     Mouth: Mucous membranes are moist.     Pharynx: No posterior oropharyngeal erythema.  Eyes:     Extraocular Movements: Extraocular movements intact.     Conjunctiva/sclera: Conjunctivae normal.     Pupils: Pupils are equal, round, and reactive to light.  Cardiovascular:     Rate and Rhythm: Normal rate and regular rhythm.     Pulses: Normal pulses.     Heart sounds: Normal heart sounds.  Pulmonary:     Effort: Pulmonary effort is normal. No respiratory distress.     Breath sounds: Normal breath sounds.  No stridor. No wheezing, rhonchi or rales.  Abdominal:     General: Abdomen is flat. Bowel sounds are normal.     Palpations: Abdomen is soft.  Musculoskeletal:        General: Normal range of motion.     Cervical back: Normal range of motion.  Skin:    General: Skin is warm and dry.  Neurological:     General: No focal deficit present.     Mental Status: She is alert and oriented to person, place, and time. Mental status is at baseline.  Psychiatric:        Mood and Affect: Mood normal.        Behavior: Behavior normal.     UC Treatments / Results  Labs (all labs ordered are listed, but only abnormal results are displayed) Labs Reviewed  COVID-19, FLU A+B NAA    EKG   Radiology No results found.  Procedures Procedures (including critical care time)  Medications Ordered in UC Medications - No data to display  Initial Impression / Assessment and Plan / UC Course  I have reviewed the triage vital signs and the nursing notes.  Pertinent labs & imaging results that were available during my care of the patient were reviewed by me and considered in my medical decision making (see chart for details).     Patient presents with symptoms likely from a viral upper respiratory infection. Differential includes bacterial pneumonia, sinusitis, allergic rhinitis, COVID-19, flu. Do not suspect underlying cardiopulmonary process. Symptoms seem unlikely related to ACS, CHF or COPD exacerbations, pneumonia, pneumothorax. Patient is nontoxic appearing and not in need of emergent medical intervention.  COVID-19 and flu test pending.  Recommended symptom control with over the counter medications.  Patient offered prescriptions.  Do not think that chest imaging is necessary given no tachypnea on exam, no adventitious lung sounds, normal oxygen saturation, no respiratory compromise.  Return if symptoms fail to improve in 1-2 weeks or you develop shortness of breath, chest pain, severe  headache. Patient states understanding and is agreeable.  Discharged with PCP followup.  Final Clinical Impressions(s) / UC Diagnoses   Final diagnoses:  Viral upper respiratory tract infection with cough     Discharge Instructions      It appears that you have a viral upper respiratory infection that should resolve in the next few days with symptomatic treatment.  You have prescribed 2 medications to help alleviate your symptoms.  Please follow-up if symptoms do not improve, shortness of breath develops, symptoms worsen.  ED Prescriptions     Medication Sig Dispense Auth. Provider   benzonatate (TESSALON) 100 MG capsule Take 1 capsule (100 mg total) by mouth every 8 (eight) hours as needed for cough. 21 capsule [place], [place], [place]   fluticasone [facility]) 50 MCG/ACT nasal spray Place 1 spray into both nostrils daily for 3 days. 16 g Teodora Medici, [place]      PDMP not reviewed this encounter.   Teodora Medici, [place] 05/09/21 1108

## 2021-05-09 NOTE — Discharge Instructions (Signed)
It appears that you have a viral upper respiratory infection that should resolve in the next few days with symptomatic treatment.  You have prescribed 2 medications to help alleviate your symptoms.  Please follow-up if symptoms do not improve, shortness of breath develops, symptoms worsen.

## 2021-05-09 NOTE — ED Triage Notes (Signed)
Patient c/o productive cough x 4 days, congestion, nasal drainage.  Patient has been taken Mucinex and Tylenol.  Patient is not vaccinated for COVID.

## 2021-05-11 LAB — COVID-19, FLU A+B NAA
Influenza A, NAA: NOT DETECTED
Influenza B, NAA: NOT DETECTED
SARS-CoV-2, NAA: NOT DETECTED

## 2021-05-12 ENCOUNTER — Other Ambulatory Visit: Payer: Self-pay

## 2021-05-12 ENCOUNTER — Ambulatory Visit (INDEPENDENT_AMBULATORY_CARE_PROVIDER_SITE_OTHER): Payer: Medicare Other | Admitting: Adult Health

## 2021-05-12 ENCOUNTER — Encounter: Payer: Self-pay | Admitting: Adult Health

## 2021-05-12 DIAGNOSIS — J069 Acute upper respiratory infection, unspecified: Secondary | ICD-10-CM | POA: Diagnosis not present

## 2021-05-12 MED ORDER — AZITHROMYCIN 250 MG PO TABS: ORAL_TABLET | ORAL | 0 refills | Status: AC

## 2021-05-12 NOTE — Patient Instructions (Signed)
Take zpack as directed 2 tabs today then one daily until complete Take mucinex twice daily until cough resolved.

## 2021-05-12 NOTE — Progress Notes (Signed)
Location:  [place] of Service:   clinic    CODE STATUS: full code   Allergies  Allergen Reactions   Kiwi Extract Anaphylaxis   Tdap [Tetanus-Diphth-Acell Pertussis] Swelling and Other (See Comments)    Swelling at injection site, gets very hot   Statins Other (See Comments)    RHABDOMYOLYSIS   Latex Itching, Dermatitis and Rash   Tramadol Nausea And Vomiting    Chief Complaint  Patient presents with   URI    Follow-up on upper respiratory infection. Patient was seen on 05/09/2021 at urgent care. Patient c/o cough, congestion, and trouble breathing.     HPI:  She has had a cough and congestion for the past couple of weeks. She went to urgent care who diagnosed her with a viral upper respiratory infection. She was given tesselon perles and flonase. She states that the tesselon is not effective for her cough. She states that she is feeling worse. She was tested for covid which was negative. There have been no fevers. She is not sleeping well due to cough.   Past Medical History:  Diagnosis Date   A-fib ([place])    08/19/19 afib with RVR, spontaneously converted to SR   Allergy    Arthritis    neck   Cataract    bilateral - MD monitoring cataracts   CHF (congestive heart failure) (HCC)    Chronic kidney disease, stage I    DR OTTELIN  HX UTIS   Cirrhosis ([place])    Coronary artery disease    s/p CABG 01/29/16: LIMA-LAD, SVG-RI, SVG-dRCA   COVID-19 virus infection 04/2020   Cramp of limb    Diabetes mellitus    Dysphagia, unspecified(787.20)    Dysuria    Epistaxis    GERD (gastroesophageal reflux disease)    Hearing loss    wears hearing aids   Heart murmur     DX FOR YEARS ASYMPTOMATIC   Hepatic encephalopathy    Lumbago    Myocardial infarction ([place])    NSTEMI 01/27/16   Neoplasm of uncertain behavior of skin    Nonspecific elevation of levels of transaminase or lactic acid dehydrogenase (LDH)    Osteoarthrosis, unspecified whether generalized  or localized, unspecified site    Other and unspecified hyperlipidemia    diet controlled   Pain in joint, shoulder region    Paresthesias 04/01/2015   Postablative ovarian failure    Trochanteric bursitis of left hip 12/15/2015   Type 2 diabetes mellitus without complication ([place])    Unspecified essential hypertension    no meds   Wears glasses     Past Surgical History:  Procedure Laterality Date   BACK SURGERY  06/2015   BREAST BIOPSY Left 12/18/2019   CARDIAC CATHETERIZATION N/A 01/27/2016   Procedure: Left Heart Cath and Coronary Angiography;  Surgeon: Belva Crome, MD;  Location: [facility];  Service: Cardiovascular;  Laterality: N/A;   COLONOSCOPY  2012   Dr Lajoyce Corners.    COLONOSCOPY WITH PROPOFOL N/A 07/07/2016   Procedure: COLONOSCOPY WITH PROPOFOL;  Surgeon: [name], MD;  Location: [facility];  Service: Endoscopy;  Laterality: N/A;   CORONARY ARTERY BYPASS GRAFT N/A 01/28/2016   Procedure: CORONARY ARTERY BYPASS GRAFTING (CABG) x 3 USING RIGHT LEG GREATER SAPHENOUS VEIN GRAFT;  Surgeon: Melrose Nakayama, MD;  Location: [place];  Service: Open Heart Surgery;  Laterality: N/A;   ENDOVEIN HARVEST OF GREATER SAPHENOUS VEIN Right 01/28/2016  Procedure: ENDOVEIN HARVEST OF GREATER SAPHENOUS VEIN;  Surgeon: Melrose Nakayama, MD;  Location: [place];  Service: Open Heart Surgery;  Laterality: Right;   ESOPHAGEAL BANDING  03/28/2019   Procedure: ESOPHAGEAL BANDING;  Surgeon: [name], MD;  Location: [facility];  Service: Endoscopy;;   ESOPHAGEAL BANDING  04/07/2019   Procedure: ESOPHAGEAL BANDING;  Surgeon: Juanita Craver, MD;  Location: [facility] ENDOSCOPY;  Service: Endoscopy;;   ESOPHAGOGASTRODUODENOSCOPY N/A 04/07/2019   Procedure: ESOPHAGOGASTRODUODENOSCOPY (EGD);  Surgeon: Juanita Craver, MD;  Location: [facility] ENDOSCOPY;  Service: Endoscopy;  Laterality: N/A;   ESOPHAGOGASTRODUODENOSCOPY (EGD) WITH PROPOFOL N/A 07/07/2016   Procedure: ESOPHAGOGASTRODUODENOSCOPY  (EGD) WITH PROPOFOL;  Surgeon: [name], MD;  Location: [facility];  Service: Endoscopy;  Laterality: N/A;   ESOPHAGOGASTRODUODENOSCOPY (EGD) WITH PROPOFOL N/A 03/28/2019   Procedure: ESOPHAGOGASTRODUODENOSCOPY (EGD) WITH PROPOFOL;  Surgeon: [name], MD;  Location: [facility];  Service: Endoscopy;  Laterality: N/A;   HEMOSTASIS CLIP PLACEMENT  04/07/2019   Procedure: HEMOSTASIS CLIP PLACEMENT;  Surgeon: Juanita Craver, MD;  Location: [facility];  Service: Endoscopy;;   IR ANGIOGRAM SELECTIVE EACH ADDITIONAL VESSEL  04/08/2019   IR EMBO ART  VEN HEMORR LYMPH EXTRAV  INC GUIDE ROADMAPPING  04/08/2019   IR PARACENTESIS  04/08/2019   IR RADIOLOGIST EVAL & MGMT  06/13/2019   IR RADIOLOGIST EVAL & MGMT  09/25/2019   IR RADIOLOGIST EVAL & MGMT  07/07/2020   IR TIPS  04/08/2019   MAXIMUM ACCESS (MAS)POSTERIOR LUMBAR INTERBODY FUSION (PLIF) 1 LEVEL Left 06/10/2015   Procedure: FOR MAXIMUM ACCESS (MAS) POSTERIOR LUMBAR INTERBODY FUSION (PLIF) LUMBAR THREE-FOUR EXTRAFORAMINAL MICRODISCECTOMY LUMBAR FIVE-SACRAL ONE LEFT;  Surgeon: Eustace Moore, MD;  Location: [facility];  Service: Neurosurgery;  Laterality: Left;   RADIOLOGY WITH ANESTHESIA N/A 04/08/2019   Procedure: RADIOLOGY WITH ANESTHESIA;  Surgeon: Radiologist, Medication, MD;  Location: [place];  Service: Radiology;  Laterality: N/A;   SCLEROTHERAPY  04/07/2019   Procedure: SCLEROTHERAPY;  Surgeon: Juanita Craver, MD;  Location: [facility] ENDOSCOPY;  Service: Endoscopy;;   TEE WITHOUT CARDIOVERSION N/A 01/28/2016   Procedure: TRANSESOPHAGEAL ECHOCARDIOGRAM (TEE);  Surgeon: Melrose Nakayama, MD;  Location: [place];  Service: Open Heart Surgery;  Laterality: N/A;   TUBAL LIGATION  1982   Dr Lynwood Dawley HERNIA REPAIR N/A 09/10/2019   Procedure: HERNIA REPAIR UMBILICAL ADULT;  Surgeon: Donnie Mesa, MD;  Location: [place];  Service: General;  Laterality: N/A;   UPPER GASTROINTESTINAL ENDOSCOPY     VAGINAL HYSTERECTOMY  1997   Dr  Rande Lawman    Social History   Socioeconomic History   Marital status: Married    Spouse name: Not on file   Number of children: 6   Years of education: Not on file   Highest education level: Not on file  Occupational History   Occupation: retired  Tobacco Use   Smoking status: Never   Smokeless tobacco: Never  Vaping Use   Vaping Use: Never used  Substance and Sexual Activity   Alcohol use: No   Drug use: No   Sexual activity: Yes    Partners: Male    Birth control/protection: Post-menopausal, Surgical    Comment: Hysterectomy  Other Topics Concern   Not on file  Social History Narrative   She is retired she is married and lives with her husband   6 children   No alcohol or tobacco never smoker no drug use   Social Determinants of Radio broadcast assistant Strain: Not  on file  Food Insecurity: Not on file  Transportation Needs: Not on file  Physical Activity: Not on file  Stress: Not on file  Social Connections: Not on file  Intimate Partner Violence: Not on file   Family History  Problem Relation Age of Onset   Heart disease Mother    Breast cancer Mother    Lung cancer Father    Arthritis Sister    Arthritis Brother    Liver cancer Brother    Breast cancer Maternal Aunt    Breast cancer Paternal Aunt    Heart disease Maternal Grandmother    Heart disease Maternal Grandfather    Heart disease Paternal Grandmother    Heart disease Paternal Grandfather    Other Daughter        house fire   Colon cancer Neg Hx    Esophageal cancer Neg Hx    Rectal cancer Neg Hx    Stomach cancer Neg Hx       VITAL SIGNS BP 108/60 (BP Location: Right Arm, Patient Position: Sitting, Cuff Size: Normal)    Pulse 63    Temp (!) 97.5 F (36.4 C) (Temporal)    Ht 5' 3"  (1.6 m)    Wt 129 lb (58.5 kg)    LMP  (LMP Unknown)    SpO2 98%    BMI 22.85 kg/m   Outpatient Encounter Medications as of 05/12/2021  Medication Sig   acetaminophen (TYLENOL) 500 MG tablet Take 500 mg by  mouth at bedtime.   benzonatate (TESSALON) 100 MG capsule Take 1 capsule (100 mg total) by mouth every 8 (eight) hours as needed for cough.   Biotin 10000 MCG TABS Take 10,000 mcg by mouth daily.   Calcium Carbonate-Vit D-Min (CALCIUM 1200 PO) Take 1 tablet by mouth daily.   Cholecalciferol (VITAMIN D-3 PO) Take 1,000 Units by mouth.   Cyanocobalamin (VITAMIN B 12 PO) Take 1,000 mcg by mouth daily.   empagliflozin (JARDIANCE) 25 MG TABS tablet Take 1 tablet (25 mg total) by mouth daily.   ezetimibe (ZETIA) 10 MG tablet Take 1 tablet (10 mg total) by mouth daily.   fluticasone (FLONASE) 50 MCG/ACT nasal spray Place 1 spray into both nostrils daily for 3 days.   glucose blood test strip One Touch Ultra strips. Use to test blood sugar three times daily. Dx: E11.65   Insulin Pen Needle (BD PEN NEEDLE NANO U/F) 32G X 4 MM MISC USE THREE TIMES DAILY AS DIRECTED   Insulin Syringe-Needle U-100 (INSULIN SYRINGE 1CC/31GX5/16") 31G X 5/16" 1 ML MISC USE AS DIRECTED TO INJECT LEVIMIR   LEVEMIR FLEXTOUCH 100 UNIT/ML FlexPen ADMINISTER 30 UNITS UNDER THE SKIN AT BEDTIME   MAGNESIUM PO Take 500 mg by mouth daily.    metoprolol tartrate (LOPRESSOR) 25 MG tablet Take 1 tablet (25 mg total) by mouth as needed (use as needed for HR greater than 110).   Multiple Vitamins-Minerals (MULTIVITAMIN WITH MINERALS) tablet Take 1 tablet by mouth daily.   Omega-3 Fatty Acids (FISH OIL PO) Take 1 capsule by mouth daily.   pantoprazole (PROTONIX) 40 MG tablet TAKE 1 TABLET(40 MG) BY MOUTH DAILY   Polyethyl Glycol-Propyl Glycol (SYSTANE OP) Place 1 drop into both eyes daily.    Probiotic Product (PROBIOTIC DAILY PO) Take 1 capsule by mouth daily. Digestive Advantage Probiotic   sucralfate (CARAFATE) 1 g tablet Take 1 tablet (1 g total) by mouth 4 (four) times daily -  with meals and at bedtime.   XIFAXAN 550 MG  TABS tablet Take 1 tablet (550 mg total) by mouth 2 (two) times daily.   furosemide (LASIX) 20 MG tablet Take 1  tablet (20 mg total) by mouth daily.   [DISCONTINUED] famotidine (PEPCID) 20 MG tablet Take 1 tablet (20 mg total) by mouth 2 (two) times daily for 7 days. Take one tablet twice daily for two days   No facility-administered encounter medications on file as of 05/12/2021.     SIGNIFICANT DIAGNOSTIC EXAMS  Review of Systems  Constitutional:  Positive for malaise/fatigue. Negative for fever.  HENT:  Positive for congestion and sore throat. Negative for nosebleeds and sinus pain.   Respiratory:  Positive for cough and shortness of breath. Negative for sputum production.   Cardiovascular:  Negative for chest pain, palpitations and leg swelling.  Gastrointestinal:  Negative for abdominal pain, constipation and heartburn.  Musculoskeletal:  Negative for back pain, joint pain and myalgias.  Skin: Negative.   Neurological:  Negative for dizziness.  Psychiatric/Behavioral:  The patient is not nervous/anxious.     Physical Exam Constitutional:      General: She is not in acute distress.    Appearance: She is well-developed. She is not diaphoretic.  HENT:     Nose: Nose normal.     Mouth/Throat:     Mouth: Mucous membranes are moist.     Pharynx: Oropharynx is clear.  Eyes:     Conjunctiva/sclera: Conjunctivae normal.  Neck:     Thyroid: No thyromegaly.  Cardiovascular:     Rate and Rhythm: Normal rate and regular rhythm.     Pulses: Normal pulses.     Heart sounds: Normal heart sounds.  Pulmonary:     Effort: Pulmonary effort is normal. No respiratory distress.     Breath sounds: Normal breath sounds.  Abdominal:     General: Bowel sounds are normal. There is no distension.     Palpations: Abdomen is soft.     Tenderness: There is no abdominal tenderness.  Musculoskeletal:        General: Normal range of motion.     Cervical back: Neck supple.     Right lower leg: No edema.     Left lower leg: No edema.  Lymphadenopathy:     Cervical: Cervical adenopathy present.  Skin:     General: Skin is warm and dry.  Neurological:     Mental Status: She is alert and oriented to person, place, and time.  Psychiatric:        Mood and Affect: Mood normal.      ASSESSMENT/ PLAN:  TODAY  Upper respiratory infection: is worse; will begin zpack to take as directed; will begin mucinex 600 mg twice daily until cough has resolved. Will use flonase at night. Will stop tesselon . Have been instructed to call for fever greater than 100.5 or worsening shortness of breath or wheezing.    Ok Edwards NP [facility]  Contact [phone] Monday through Friday 8am- 5pm  After hours call [phone]

## 2021-05-26 ENCOUNTER — Other Ambulatory Visit: Payer: Self-pay | Admitting: *Deleted

## 2021-05-26 ENCOUNTER — Other Ambulatory Visit: Payer: Self-pay | Admitting: Cardiology

## 2021-05-26 MED ORDER — PANTOPRAZOLE SODIUM 40 MG PO TBEC: DELAYED_RELEASE_TABLET | ORAL | 1 refills | Status: DC

## 2021-05-26 NOTE — Telephone Encounter (Signed)
[facility] requested refill.

## 2021-05-28 ENCOUNTER — Telehealth: Payer: Self-pay | Admitting: Pharmacy Technician

## 2021-05-28 DIAGNOSIS — Z596 Low income: Secondary | ICD-10-CM

## 2021-05-28 NOTE — Progress Notes (Signed)
[facility])                                            [place] Team    05/28/2021  Nohelani Benning 01-Apr-1946 [id number]  Care coordination call placed to [place] in regard to Levemir application.  Spoke to [place] who informs patient is APPROVED 05/09/21-05/08/22. She informs medication will ship in 2023 based on last fill date in 2022 and going forward with delivery to the provider's office.  Adalia Pettis P. Shaquala Broeker, [place]  [phone]

## 2021-05-29 ENCOUNTER — Inpatient Hospital Stay (HOSPITAL_COMMUNITY)
Admission: EM | Admit: 2021-05-29 | Discharge: 2021-06-01 | DRG: 660 | Disposition: A | Discharge status: Home / Self Care | Payer: Medicare Other | Attending: Internal Medicine | Admitting: Internal Medicine

## 2021-05-29 ENCOUNTER — Encounter (HOSPITAL_COMMUNITY): Payer: Self-pay | Admitting: Emergency Medicine

## 2021-05-29 ENCOUNTER — Emergency Department (HOSPITAL_COMMUNITY): Payer: Medicare Other

## 2021-05-29 ENCOUNTER — Other Ambulatory Visit: Payer: Self-pay

## 2021-05-29 DIAGNOSIS — I10 Essential (primary) hypertension: Secondary | ICD-10-CM | POA: Diagnosis not present

## 2021-05-29 DIAGNOSIS — I130 Hypertensive heart and chronic kidney disease with heart failure and stage 1 through stage 4 chronic kidney disease, or unspecified chronic kidney disease: Secondary | ICD-10-CM | POA: Diagnosis not present

## 2021-05-29 DIAGNOSIS — Z803 Family history of malignant neoplasm of breast: Secondary | ICD-10-CM | POA: Diagnosis not present

## 2021-05-29 DIAGNOSIS — Z888 Allergy status to other drugs, medicaments and biological substances status: Secondary | ICD-10-CM

## 2021-05-29 DIAGNOSIS — Z801 Family history of malignant neoplasm of trachea, bronchus and lung: Secondary | ICD-10-CM | POA: Diagnosis not present

## 2021-05-29 DIAGNOSIS — Z87442 Personal history of urinary calculi: Secondary | ICD-10-CM

## 2021-05-29 DIAGNOSIS — L989 Disorder of the skin and subcutaneous tissue, unspecified: Secondary | ICD-10-CM | POA: Diagnosis present

## 2021-05-29 DIAGNOSIS — I110 Hypertensive heart disease with heart failure: Secondary | ICD-10-CM | POA: Diagnosis not present

## 2021-05-29 DIAGNOSIS — N132 Hydronephrosis with renal and ureteral calculous obstruction: Secondary | ICD-10-CM | POA: Diagnosis not present

## 2021-05-29 DIAGNOSIS — Z419 Encounter for procedure for purposes other than remedying health state, unspecified: Secondary | ICD-10-CM

## 2021-05-29 DIAGNOSIS — Z8261 Family history of arthritis: Secondary | ICD-10-CM | POA: Diagnosis not present

## 2021-05-29 DIAGNOSIS — N1330 Unspecified hydronephrosis: Secondary | ICD-10-CM | POA: Diagnosis not present

## 2021-05-29 DIAGNOSIS — N390 Urinary tract infection, site not specified: Secondary | ICD-10-CM | POA: Diagnosis not present

## 2021-05-29 DIAGNOSIS — Z794 Long term (current) use of insulin: Secondary | ICD-10-CM | POA: Diagnosis not present

## 2021-05-29 DIAGNOSIS — E871 Hypo-osmolality and hyponatremia: Secondary | ICD-10-CM | POA: Diagnosis present

## 2021-05-29 DIAGNOSIS — N201 Calculus of ureter: Secondary | ICD-10-CM | POA: Diagnosis not present

## 2021-05-29 DIAGNOSIS — Z66 Do not resuscitate: Secondary | ICD-10-CM | POA: Diagnosis present

## 2021-05-29 DIAGNOSIS — Z90710 Acquired absence of both cervix and uterus: Secondary | ICD-10-CM

## 2021-05-29 DIAGNOSIS — I480 Paroxysmal atrial fibrillation: Secondary | ICD-10-CM | POA: Diagnosis not present

## 2021-05-29 DIAGNOSIS — E1159 Type 2 diabetes mellitus with other circulatory complications: Secondary | ICD-10-CM | POA: Diagnosis not present

## 2021-05-29 DIAGNOSIS — E1122 Type 2 diabetes mellitus with diabetic chronic kidney disease: Secondary | ICD-10-CM | POA: Diagnosis not present

## 2021-05-29 DIAGNOSIS — Z8249 Family history of ischemic heart disease and other diseases of the circulatory system: Secondary | ICD-10-CM

## 2021-05-29 DIAGNOSIS — Z20822 Contact with and (suspected) exposure to covid-19: Secondary | ICD-10-CM | POA: Diagnosis present

## 2021-05-29 DIAGNOSIS — K7460 Unspecified cirrhosis of liver: Secondary | ICD-10-CM | POA: Diagnosis not present

## 2021-05-29 DIAGNOSIS — I509 Heart failure, unspecified: Secondary | ICD-10-CM | POA: Diagnosis not present

## 2021-05-29 DIAGNOSIS — Z7984 Long term (current) use of oral hypoglycemic drugs: Secondary | ICD-10-CM

## 2021-05-29 DIAGNOSIS — Z951 Presence of aortocoronary bypass graft: Secondary | ICD-10-CM

## 2021-05-29 DIAGNOSIS — K7469 Other cirrhosis of liver: Secondary | ICD-10-CM | POA: Diagnosis not present

## 2021-05-29 DIAGNOSIS — N136 Pyonephrosis: Secondary | ICD-10-CM | POA: Diagnosis not present

## 2021-05-29 DIAGNOSIS — R109 Unspecified abdominal pain: Secondary | ICD-10-CM | POA: Diagnosis not present

## 2021-05-29 DIAGNOSIS — Z91040 Latex allergy status: Secondary | ICD-10-CM

## 2021-05-29 DIAGNOSIS — K7682 Hepatic encephalopathy: Secondary | ICD-10-CM | POA: Diagnosis present

## 2021-05-29 DIAGNOSIS — K219 Gastro-esophageal reflux disease without esophagitis: Secondary | ICD-10-CM | POA: Diagnosis present

## 2021-05-29 DIAGNOSIS — Z800 Family history of malignant neoplasm of digestive organs: Secondary | ICD-10-CM

## 2021-05-29 DIAGNOSIS — R8271 Bacteriuria: Secondary | ICD-10-CM | POA: Diagnosis not present

## 2021-05-29 DIAGNOSIS — I2510 Atherosclerotic heart disease of native coronary artery without angina pectoris: Secondary | ICD-10-CM | POA: Diagnosis not present

## 2021-05-29 DIAGNOSIS — D696 Thrombocytopenia, unspecified: Secondary | ICD-10-CM | POA: Diagnosis present

## 2021-05-29 DIAGNOSIS — E1165 Type 2 diabetes mellitus with hyperglycemia: Secondary | ICD-10-CM | POA: Diagnosis not present

## 2021-05-29 DIAGNOSIS — Z91018 Allergy to other foods: Secondary | ICD-10-CM

## 2021-05-29 DIAGNOSIS — Z79899 Other long term (current) drug therapy: Secondary | ICD-10-CM

## 2021-05-29 DIAGNOSIS — N181 Chronic kidney disease, stage 1: Secondary | ICD-10-CM | POA: Diagnosis present

## 2021-05-29 DIAGNOSIS — E119 Type 2 diabetes mellitus without complications: Secondary | ICD-10-CM

## 2021-05-29 LAB — COMPREHENSIVE METABOLIC PANEL
ALT: 51 U/L — ABNORMAL HIGH (ref 0–44)
AST: 90 U/L — ABNORMAL HIGH (ref 15–41)
Albumin: 3 g/dL — ABNORMAL LOW (ref 3.5–5.0)
Alkaline Phosphatase: 112 U/L (ref 38–126)
Anion gap: 11 (ref 5–15)
BUN: 14 mg/dL (ref 8–23)
CO2: 24 mmol/L (ref 22–32)
Calcium: 9.9 mg/dL (ref 8.9–10.3)
Chloride: 103 mmol/L (ref 98–111)
Creatinine, Ser: 0.8 mg/dL (ref 0.44–1.00)
GFR, Estimated: >60 mL/min (ref >60)
Glucose, Bld: 211 mg/dL — ABNORMAL HIGH (ref 70–99)
Potassium: 3.9 mmol/L (ref 3.5–5.1)
Sodium: 138 mmol/L (ref 135–145)
Total Bilirubin: 2.1 mg/dL — ABNORMAL HIGH (ref 0.3–1.2)
Total Protein: 7 g/dL (ref 6.5–8.1)

## 2021-05-29 LAB — LIPASE, BLOOD: Lipase: 28 U/L (ref 11–51)

## 2021-05-29 LAB — URINALYSIS, ROUTINE W REFLEX MICROSCOPIC
Bilirubin Urine: NEGATIVE
Glucose, UA: >=500 mg/dL — AB
Ketones, ur: NEGATIVE mg/dL
Nitrite: NEGATIVE
Protein, ur: NEGATIVE mg/dL
Specific Gravity, Urine: 1.022 (ref 1.005–1.030)
WBC, UA: >50 WBC/hpf — ABNORMAL HIGH (ref 0–5)
pH: 7 (ref 5.0–8.0)

## 2021-05-29 LAB — CBC
HCT: 41.4 % (ref 36.0–46.0)
Hemoglobin: 13.7 g/dL (ref 12.0–15.0)
MCH: 31.6 pg (ref 26.0–34.0)
MCHC: 33.1 g/dL (ref 30.0–36.0)
MCV: 95.6 fL (ref 80.0–100.0)
Platelets: 122 10*3/uL — ABNORMAL LOW (ref 150–400)
RBC: 4.33 MIL/uL (ref 3.87–5.11)
RDW: 13.9 % (ref 11.5–15.5)
WBC: 7.7 10*3/uL (ref 4.0–10.5)
nRBC: 0 % (ref 0.0–0.2)

## 2021-05-29 LAB — PROTIME-INR
INR: 1.1 (ref 0.8–1.2)
Prothrombin Time: 14.1 seconds (ref 11.4–15.2)

## 2021-05-29 MED ORDER — IOHEXOL 300 MG/ML  SOLN
100.0000 mL | Freq: Once | INTRAMUSCULAR | Status: AC | PRN
  Administered 2021-05-29: 100 mL via INTRAVENOUS

## 2021-05-29 MED ORDER — SODIUM CHLORIDE 0.9 % IV SOLN
1.0000 g | INTRAVENOUS | Status: DC
  Administered 2021-05-30 – 2021-05-31 (×2): 1 g via INTRAVENOUS
  Filled 2021-05-29 (×2): qty 10

## 2021-05-29 MED ORDER — MORPHINE SULFATE (PF) 2 MG/ML IV SOLN
2.0000 mg | INTRAVENOUS | Status: DC | PRN
  Administered 2021-05-30: 4 mg via INTRAVENOUS
  Filled 2021-05-29: qty 2

## 2021-05-29 MED ORDER — ONDANSETRON HCL 4 MG/2ML IJ SOLN
4.0000 mg | Freq: Once | INTRAMUSCULAR | Status: AC
  Administered 2021-05-29: 4 mg via INTRAVENOUS
  Filled 2021-05-29: qty 2

## 2021-05-29 MED ORDER — SODIUM CHLORIDE 0.9 % IV BOLUS
500.0000 mL | Freq: Once | INTRAVENOUS | Status: AC
  Administered 2021-05-29: 500 mL via INTRAVENOUS

## 2021-05-29 MED ORDER — MORPHINE SULFATE (PF) 4 MG/ML IV SOLN
4.0000 mg | Freq: Once | INTRAVENOUS | Status: AC
  Administered 2021-05-29: 4 mg via INTRAVENOUS
  Filled 2021-05-29: qty 1

## 2021-05-29 MED ORDER — FENTANYL CITRATE PF 50 MCG/ML IJ SOSY
50.0000 ug | PREFILLED_SYRINGE | Freq: Once | INTRAMUSCULAR | Status: AC
  Administered 2021-05-29: 50 ug via INTRAVENOUS
  Filled 2021-05-29: qty 1

## 2021-05-29 MED ORDER — SODIUM CHLORIDE 0.9 % IV SOLN
1.0000 g | Freq: Once | INTRAVENOUS | Status: AC
  Administered 2021-05-29: 1 g via INTRAVENOUS
  Filled 2021-05-29: qty 10

## 2021-05-29 NOTE — ED Triage Notes (Signed)
Pt here for L side abd pain that started this morning when she woke up. Pt states pain is a constant ache and radiates around to her L lower back. Pt endorses nausea, denies vomiting, diarrhea/constipation, or urinary symptoms. Hx liver cirrhosis

## 2021-05-29 NOTE — ED Notes (Signed)
Called lab and stated that they will collect urine culture off previous sample sent down to lab.

## 2021-05-30 ENCOUNTER — Inpatient Hospital Stay (HOSPITAL_COMMUNITY): Payer: Medicare Other

## 2021-05-30 ENCOUNTER — Encounter (HOSPITAL_COMMUNITY): Payer: Self-pay | Admitting: Internal Medicine

## 2021-05-30 ENCOUNTER — Inpatient Hospital Stay (HOSPITAL_COMMUNITY): Payer: Medicare Other | Admitting: Anesthesiology

## 2021-05-30 ENCOUNTER — Encounter (HOSPITAL_COMMUNITY)
Admission: EM | Disposition: A | Discharge status: Home / Self Care | Payer: Self-pay | Source: Home / Self Care | Attending: Internal Medicine

## 2021-05-30 DIAGNOSIS — Z794 Long term (current) use of insulin: Secondary | ICD-10-CM

## 2021-05-30 DIAGNOSIS — I10 Essential (primary) hypertension: Secondary | ICD-10-CM

## 2021-05-30 DIAGNOSIS — N136 Pyonephrosis: Principal | ICD-10-CM

## 2021-05-30 DIAGNOSIS — K7469 Other cirrhosis of liver: Secondary | ICD-10-CM | POA: Diagnosis not present

## 2021-05-30 DIAGNOSIS — E1159 Type 2 diabetes mellitus with other circulatory complications: Secondary | ICD-10-CM

## 2021-05-30 DIAGNOSIS — N390 Urinary tract infection, site not specified: Secondary | ICD-10-CM | POA: Diagnosis not present

## 2021-05-30 DIAGNOSIS — N132 Hydronephrosis with renal and ureteral calculous obstruction: Secondary | ICD-10-CM

## 2021-05-30 HISTORY — PX: CYSTOSCOPY WITH RETROGRADE PYELOGRAM, URETEROSCOPY AND STENT PLACEMENT: SHX5789

## 2021-05-30 LAB — CBC
HCT: 41 % (ref 36.0–46.0)
Hemoglobin: 13.4 g/dL (ref 12.0–15.0)
MCH: 31.1 pg (ref 26.0–34.0)
MCHC: 32.7 g/dL (ref 30.0–36.0)
MCV: 95.1 fL (ref 80.0–100.0)
Platelets: 114 10*3/uL — ABNORMAL LOW (ref 150–400)
RBC: 4.31 MIL/uL (ref 3.87–5.11)
RDW: 14.2 % (ref 11.5–15.5)
WBC: 9.7 10*3/uL (ref 4.0–10.5)
nRBC: 0 % (ref 0.0–0.2)

## 2021-05-30 LAB — COMPREHENSIVE METABOLIC PANEL
ALT: 44 U/L (ref 0–44)
AST: 63 U/L — ABNORMAL HIGH (ref 15–41)
Albumin: 2.7 g/dL — ABNORMAL LOW (ref 3.5–5.0)
Alkaline Phosphatase: 94 U/L (ref 38–126)
Anion gap: 10 (ref 5–15)
BUN: 16 mg/dL (ref 8–23)
CO2: 22 mmol/L (ref 22–32)
Calcium: 9.1 mg/dL (ref 8.9–10.3)
Chloride: 106 mmol/L (ref 98–111)
Creatinine, Ser: 0.83 mg/dL (ref 0.44–1.00)
GFR, Estimated: >60 mL/min (ref >60)
Glucose, Bld: 134 mg/dL — ABNORMAL HIGH (ref 70–99)
Potassium: 3.6 mmol/L (ref 3.5–5.1)
Sodium: 138 mmol/L (ref 135–145)
Total Bilirubin: 2.3 mg/dL — ABNORMAL HIGH (ref 0.3–1.2)
Total Protein: 6.6 g/dL (ref 6.5–8.1)

## 2021-05-30 LAB — BASIC METABOLIC PANEL
Anion gap: 17 — ABNORMAL HIGH (ref 5–15)
BUN: 23 mg/dL (ref 8–23)
CO2: 20 mmol/L — ABNORMAL LOW (ref 22–32)
Calcium: 8.7 mg/dL — ABNORMAL LOW (ref 8.9–10.3)
Chloride: 98 mmol/L (ref 98–111)
Creatinine, Ser: 1.13 mg/dL — ABNORMAL HIGH (ref 0.44–1.00)
GFR, Estimated: 51 mL/min — ABNORMAL LOW (ref >60)
Glucose, Bld: 417 mg/dL — ABNORMAL HIGH (ref 70–99)
Potassium: 3.9 mmol/L (ref 3.5–5.1)
Sodium: 135 mmol/L (ref 135–145)

## 2021-05-30 LAB — SURGICAL PCR SCREEN
MRSA, PCR: NEGATIVE
Staphylococcus aureus: NEGATIVE

## 2021-05-30 LAB — GLUCOSE, CAPILLARY
Glucose-Capillary: 156 mg/dL — ABNORMAL HIGH (ref 70–99)
Glucose-Capillary: 132 mg/dL — ABNORMAL HIGH (ref 70–99)
Glucose-Capillary: 103 mg/dL — ABNORMAL HIGH (ref 70–99)
Glucose-Capillary: 120 mg/dL — ABNORMAL HIGH (ref 70–99)
Glucose-Capillary: 105 mg/dL — ABNORMAL HIGH (ref 70–99)
Glucose-Capillary: 111 mg/dL — ABNORMAL HIGH (ref 70–99)
Glucose-Capillary: 120 mg/dL — ABNORMAL HIGH (ref 70–99)
Glucose-Capillary: 296 mg/dL — ABNORMAL HIGH (ref 70–99)
Glucose-Capillary: 470 mg/dL — ABNORMAL HIGH (ref 70–99)

## 2021-05-30 LAB — RESP PANEL BY RT-PCR (FLU A&B, COVID) ARPGX2
Influenza A by PCR: NEGATIVE
Influenza B by PCR: NEGATIVE
SARS Coronavirus 2 by RT PCR: NEGATIVE

## 2021-05-30 SURGERY — CYSTOURETEROSCOPY, WITH RETROGRADE PYELOGRAM AND STENT INSERTION
Anesthesia: General | Site: Ureter | Laterality: Left

## 2021-05-30 MED ORDER — ACETAMINOPHEN 325 MG PO TABS
650.0000 mg | ORAL_TABLET | Freq: Four times a day (QID) | ORAL | Status: DC | PRN
  Administered 2021-05-31 (×2): 650 mg via ORAL
  Filled 2021-05-30 (×2): qty 2

## 2021-05-30 MED ORDER — ONDANSETRON HCL 4 MG/2ML IJ SOLN
INTRAMUSCULAR | Status: DC | PRN
Start: 2021-05-30 — End: 2021-05-30
  Administered 2021-05-30: 4 mg via INTRAVENOUS

## 2021-05-30 MED ORDER — CHLORHEXIDINE GLUCONATE 0.12 % MT SOLN
OROMUCOSAL | Status: AC
  Administered 2021-05-30: 15 mL via OROMUCOSAL
  Filled 2021-05-30: qty 15

## 2021-05-30 MED ORDER — LACTATED RINGERS IV SOLN: INTRAVENOUS | Status: DC

## 2021-05-30 MED ORDER — INSULIN ASPART 100 UNIT/ML IJ SOLN
0.0000 [IU] | INTRAMUSCULAR | Status: DC
  Administered 2021-05-30: 2 [IU] via SUBCUTANEOUS
  Administered 2021-05-30: 1 [IU] via SUBCUTANEOUS

## 2021-05-30 MED ORDER — INSULIN ASPART 100 UNIT/ML IJ SOLN
0.0000 [IU] | Freq: Three times a day (TID) | INTRAMUSCULAR | Status: DC
  Administered 2021-05-30: 5 [IU] via SUBCUTANEOUS

## 2021-05-30 MED ORDER — PROPOFOL 10 MG/ML IV BOLUS
INTRAVENOUS | Status: AC
  Filled 2021-05-30: qty 20

## 2021-05-30 MED ORDER — GERHARDT'S BUTT CREAM
TOPICAL_CREAM | Freq: Three times a day (TID) | CUTANEOUS | Status: DC
  Filled 2021-05-30 (×2): qty 1

## 2021-05-30 MED ORDER — DEXAMETHASONE SODIUM PHOSPHATE 10 MG/ML IJ SOLN
INTRAMUSCULAR | Status: DC | PRN
  Administered 2021-05-30: 10 mg via INTRAVENOUS

## 2021-05-30 MED ORDER — SODIUM CHLORIDE 0.9 % IR SOLN
Status: DC | PRN
  Administered 2021-05-30: 3000 mL

## 2021-05-30 MED ORDER — EZETIMIBE 10 MG PO TABS
10.0000 mg | ORAL_TABLET | Freq: Every day | ORAL | Status: DC
  Administered 2021-05-30 – 2021-06-01 (×3): 10 mg via ORAL
  Filled 2021-05-30 (×3): qty 1

## 2021-05-30 MED ORDER — FENTANYL CITRATE (PF) 250 MCG/5ML IJ SOLN
INTRAMUSCULAR | Status: AC
  Filled 2021-05-30: qty 5

## 2021-05-30 MED ORDER — LACTULOSE 10 GM/15ML PO SOLN
20.0000 g | Freq: Every day | ORAL | Status: DC
  Administered 2021-05-30 – 2021-06-01 (×3): 20 g via ORAL
  Filled 2021-05-30 (×3): qty 30

## 2021-05-30 MED ORDER — LIDOCAINE 2% (20 MG/ML) 5 ML SYRINGE
INTRAMUSCULAR | Status: AC
  Filled 2021-05-30: qty 5

## 2021-05-30 MED ORDER — PHENYLEPHRINE 40 MCG/ML (10ML) SYRINGE FOR IV PUSH (FOR BLOOD PRESSURE SUPPORT)
PREFILLED_SYRINGE | INTRAVENOUS | Status: DC | PRN
  Administered 2021-05-30: 120 ug via INTRAVENOUS

## 2021-05-30 MED ORDER — RIFAXIMIN 550 MG PO TABS
550.0000 mg | ORAL_TABLET | Freq: Two times a day (BID) | ORAL | Status: DC
  Administered 2021-05-30 – 2021-06-01 (×5): 550 mg via ORAL
  Filled 2021-05-30 (×5): qty 1

## 2021-05-30 MED ORDER — ONDANSETRON HCL 4 MG/2ML IJ SOLN
4.0000 mg | Freq: Four times a day (QID) | INTRAMUSCULAR | Status: DC | PRN
Start: 2021-05-30 — End: 2021-06-01

## 2021-05-30 MED ORDER — FENTANYL CITRATE (PF) 250 MCG/5ML IJ SOLN
INTRAMUSCULAR | Status: DC | PRN
  Administered 2021-05-30: 50 ug via INTRAVENOUS

## 2021-05-30 MED ORDER — POLYVINYL ALCOHOL 1.4 % OP SOLN
Freq: Every day | OPHTHALMIC | Status: DC
  Administered 2021-05-30: 1 [drp] via OPHTHALMIC
  Filled 2021-05-30 (×2): qty 15

## 2021-05-30 MED ORDER — ACETAMINOPHEN 10 MG/ML IV SOLN: 1000.0000 mg | Freq: Once | INTRAVENOUS | Status: DC | PRN

## 2021-05-30 MED ORDER — ACETAMINOPHEN 650 MG RE SUPP: 650.0000 mg | Freq: Four times a day (QID) | RECTAL | Status: DC | PRN

## 2021-05-30 MED ORDER — 0.9 % SODIUM CHLORIDE (POUR BTL) OPTIME
TOPICAL | Status: DC | PRN
  Administered 2021-05-30: 1000 mL

## 2021-05-30 MED ORDER — ADULT MULTIVITAMIN W/MINERALS CH
1.0000 | ORAL_TABLET | Freq: Every day | ORAL | Status: DC
  Administered 2021-05-30 – 2021-06-01 (×3): 1 via ORAL
  Filled 2021-05-30 (×3): qty 1

## 2021-05-30 MED ORDER — ONDANSETRON HCL 4 MG PO TABS: 4.0000 mg | ORAL_TABLET | Freq: Four times a day (QID) | ORAL | Status: DC | PRN

## 2021-05-30 MED ORDER — LIDOCAINE 2% (20 MG/ML) 5 ML SYRINGE
INTRAMUSCULAR | Status: DC | PRN
Start: 2021-05-30 — End: 2021-05-30
  Administered 2021-05-30: 40 mg via INTRAVENOUS

## 2021-05-30 MED ORDER — CHLORHEXIDINE GLUCONATE 0.12 % MT SOLN: 15.0000 mL | Freq: Once | OROMUCOSAL | Status: AC

## 2021-05-30 MED ORDER — PROPOFOL 10 MG/ML IV BOLUS
INTRAVENOUS | Status: DC | PRN
  Administered 2021-05-30: 100 mg via INTRAVENOUS
  Administered 2021-05-30: 50 mg via INTRAVENOUS

## 2021-05-30 MED ORDER — ORAL CARE MOUTH RINSE: 15.0000 mL | Freq: Once | OROMUCOSAL | Status: AC

## 2021-05-30 MED ORDER — PANTOPRAZOLE SODIUM 40 MG PO TBEC
40.0000 mg | DELAYED_RELEASE_TABLET | Freq: Every day | ORAL | Status: DC
  Administered 2021-05-30 – 2021-06-01 (×3): 40 mg via ORAL
  Filled 2021-05-30 (×3): qty 1

## 2021-05-30 MED ORDER — FENTANYL CITRATE (PF) 100 MCG/2ML IJ SOLN: 25.0000 ug | INTRAMUSCULAR | Status: DC | PRN

## 2021-05-30 MED ORDER — IOHEXOL 300 MG/ML  SOLN
INTRAMUSCULAR | Status: DC | PRN
  Administered 2021-05-30: 15 mL via URETHRAL

## 2021-05-30 MED ORDER — INSULIN DETEMIR 100 UNIT/ML ~~LOC~~ SOLN
15.0000 [IU] | Freq: Every day | SUBCUTANEOUS | Status: DC
  Administered 2021-05-30 (×2): 15 [IU] via SUBCUTANEOUS
  Filled 2021-05-30 (×4): qty 0.15

## 2021-05-30 SURGICAL SUPPLY — 27 items
BAG COUNTER SPONGE SURGICOUNT (BAG) ×2 IMPLANT
BAG SURGICOUNT SPONGE COUNTING (BAG)
BAG URO CATCHER STRL LF (MISCELLANEOUS) ×3 IMPLANT
BASKET ZERO TIP NITINOL 2.4FR (BASKET) ×1 IMPLANT
BENZOIN TINCTURE PRP APPL 2/3 (GAUZE/BANDAGES/DRESSINGS) ×2 IMPLANT
CATH URET 5FR 28IN OPEN ENDED (CATHETERS) ×2 IMPLANT
CLOSURE WOUND 1/2 X4 (GAUZE/BANDAGES/DRESSINGS) ×1
ELECT REM PT RETURN 9FT ADLT (ELECTROSURGICAL) ×3
ELECTRODE REM PT RTRN 9FT ADLT (ELECTROSURGICAL) IMPLANT
FIBER LASER TRAC TIP (UROLOGICAL SUPPLIES) IMPLANT
GLOVE SURG ENC MOIS LTX SZ7.5 (GLOVE) ×1 IMPLANT
GLOVE SURG GAMMEX LF SZ7.5 (GLOVE) ×4 IMPLANT
GOWN STRL REUS W/ TWL XL LVL3 (GOWN DISPOSABLE) IMPLANT
GOWN STRL REUS W/TWL XL LVL3 (GOWN DISPOSABLE) ×3
GUIDEWIRE ANG ZIPWIRE 038X150 (WIRE) ×3 IMPLANT
GUIDEWIRE STR DUAL SENSOR (WIRE) IMPLANT
IV NS IRRIG 3000ML ARTHROMATIC (IV SOLUTION) ×3 IMPLANT
KIT TURNOVER KIT B (KITS) ×3 IMPLANT
MANIFOLD NEPTUNE II (INSTRUMENTS) ×4 IMPLANT
NS IRRIG 1000ML POUR BTL (IV SOLUTION) ×3 IMPLANT
PACK CYSTO (CUSTOM PROCEDURE TRAY) ×3 IMPLANT
SOL PREP POV-IOD 4OZ 10% (MISCELLANEOUS) ×3 IMPLANT
STENT CONTOUR 6FRX24X.038 (STENTS) ×2 IMPLANT
STRIP CLOSURE SKIN 1/2X4 (GAUZE/BANDAGES/DRESSINGS) ×1 IMPLANT
SYR 10ML LL (SYRINGE) ×1 IMPLANT
TUBE CONNECTING 12'X1/4 (SUCTIONS) ×1
TUBE CONNECTING 12X1/4 (SUCTIONS) ×1 IMPLANT

## 2021-05-30 NOTE — Transfer of Care (Signed)
Immediate Anesthesia Transfer of Care Note  Patient: [name]  Procedure(s) Performed: CYSTOSCOPY WITH RETROGRADE PYELOGRAM, URETEROSCOPY AND STENT PLACEMENT (Left)  Patient Location: PACU  Anesthesia Type:General  Level of Consciousness: drowsy and patient cooperative  Airway & Oxygen Therapy: Patient Spontanous Breathing  Post-op Assessment: Report given to RN and Post -op Vital signs reviewed and stable  Post vital signs: Reviewed and stable  Last Vitals:  Vitals Value Taken Time  BP 121/56 05/30/21 0930  Temp    Pulse 84 05/30/21 0931  Resp 13 05/30/21 0931  SpO2 97 % 05/30/21 0931  Vitals shown include unvalidated device data.  Last Pain:  Vitals:   05/30/21 0750  TempSrc: Oral  PainSc:          Complications: No notable events documented.

## 2021-05-30 NOTE — Op Note (Signed)
Operative Note  Preoperative diagnosis:  1.  4 mm left proximal ureteral stone 2.  UTI  Postoperative diagnosis: Same  Procedure(s): 1.  Cystoscopy with left ureteral stent placement 2.  Left retrograde pyelogram with intraoperative interpretation of fluoroscopic imaging  Surgeon: Ellison Hughs, MD  Assistants:  None  Anesthesia:  General  Complications:  None  EBL: Less than 5 mL  Specimens: 1.  Urine for culture and sensitivity  Drains/Catheters: 1.  Left 6 French, 24 cm JJ stent without tether  Intraoperative findings:   Left retrograde pyelogram revealed a filling defect within the proximal aspects of the left ureter, consistent with the obstructing stone seen on recent CT.  The left renal pelvis and its associated calyces was moderately dilated with no internal filling defects.  The distal aspect of the ureter was of normal caliber and showed no filling defects.   Indication:  [name] is a 76 y.o. female with an obstructing 4 mm left ureteral calculus as well as evidence of a urinary tract infection.  She has been consented for the above procedures, voices understanding and wishes to proceed.  Description of procedure:  After informed consent was obtained, the patient was brought to the operating room and general LMA anesthesia was administered. The patient was then placed in the dorsolithotomy position and prepped and draped in the usual sterile fashion. A timeout was performed. A 23 French rigid cystoscope was then inserted into the urethral meatus and advanced into the bladder under direct vision. A complete bladder survey revealed no intravesical pathology.  A 5 French ureteral catheter was then inserted into the left ureteral orifice and a retrograde pyelogram was obtained, with the findings listed above.  A Glidewire was then used to intubate the lumen of the ureteral catheter and was advanced up to the left renal pelvis, under fluoroscopic  guidance.  The catheter was then removed, leaving the wire in place.  A 6 French, 24 cm JJ stent was then advanced over the wire and into good position within the left collecting system, confirming placement via fluoroscopy.  A urine specimen was obtained for culture.  Her bladder was drained.  She tolerated the procedure well and was transferred to the postanesthesia in stable condition.  Plan: Continue IV Rocephin and IV fluids.  We will arrange outpatient follow-up with Dr. Abner Greenspan for definitive stone treatment.

## 2021-05-30 NOTE — Progress Notes (Signed)
Patient ID: [name], female   DOB: 07-29-1945, 76 y.o.   MRN: [id number]  76 year old female admitted with obstructive ureter stone with UTI.  On IV Rocephin.  Vital signs stable.  For cystoscopy today with stent placement.

## 2021-05-30 NOTE — Plan of Care (Signed)
Pt admitted to unit approx 0120. Pt alert and oriented. Pain meds given morphine 4 mg. Vitals stable.

## 2021-05-30 NOTE — Anesthesia Postprocedure Evaluation (Signed)
Anesthesia Post Note  Patient: Lisabeth Register  Procedure(s) Performed: CYSTOSCOPY WITH RETROGRADE PYELOGRAM, URETEROSCOPY AND STENT PLACEMENT (Left: Ureter)     Patient location during evaluation: PACU Anesthesia Type: General Level of consciousness: awake and alert Pain management: pain level controlled Vital Signs Assessment: post-procedure vital signs reviewed and stable Respiratory status: spontaneous breathing, nonlabored ventilation, respiratory function stable and patient connected to nasal cannula oxygen Cardiovascular status: blood pressure returned to baseline and stable Postop Assessment: no apparent nausea or vomiting Anesthetic complications: no   No notable events documented.  Last Vitals:  Vitals:   05/30/21 1000 05/30/21 1032  BP: (!) 111/54 (!) 117/58  Pulse: 80 79  Resp: 10 18  Temp: 36.7 C 37.1 C  SpO2: 95%     Last Pain:  Vitals:   05/30/21 1032  TempSrc: Oral  PainSc:                  March Rummage Meryl Ponder

## 2021-05-30 NOTE — ED Provider Notes (Signed)
[facility] Provider Note   CSN: [id number] Arrival date & time: 05/29/21  [facility]     History  Chief Complaint  Patient presents with   Abdominal Pain    [name] is a 76 y.o. female.  [name] is a 76 y.o. female with a history of diabetes, CHF, cirrhosis, CKD stage I, GERD, who presents to the emergency department for evaluation of left lower abdominal pain and left flank pain.  Symptoms started early this morning and woke her from sleep.  She reports it is a constant ache that radiates from her left lower abdomen into her left flank.  She reports some associated nausea without vomiting.  No diarrhea or constipation.  No melena or hematemesis.  No associated hematuria, dysuria or urinary frequency.  History of cirrhosis s/p TIPS procedure.  Denies abdominal swelling.  No fevers or chills but reports feeling a bit weak and unwell today.  The history is provided by the patient, the spouse and medical records.      Home Medications Prior to Admission medications   Medication Sig Start Date End Date Taking? Authorizing Provider  acetaminophen (TYLENOL) 500 MG tablet Take 1,000 mg by mouth at bedtime.   Yes [provider]  benzonatate (TESSALON) 100 MG capsule Take 1 capsule (100 mg total) by mouth every 8 (eight) hours as needed for cough. 05/09/21  Yes Mound, Hildred Alamin E, FNP  Biotin 10000 MCG TABS Take 10,000 mcg by mouth daily.   Yes [provider]  Calcium Carb-Cholecalciferol (CALCIUM 600 + D PO) Take 1 tablet by mouth in the morning and at bedtime.   Yes [provider]  Cholecalciferol (VITAMIN D-3 PO) Take 1,000 Units by mouth daily.   Yes [provider]  Cyanocobalamin (VITAMIN B 12 PO) Take 1,000 mcg by mouth daily.   Yes [provider]  empagliflozin (JARDIANCE) 25 MG TABS tablet Take 1 tablet (25 mg total) by mouth daily. 01/26/21  Yes [name], MD  ezetimibe (ZETIA) 10 MG tablet  Take 1 tablet (10 mg total) by mouth daily. 01/26/21  Yes [name], MD  furosemide (LASIX) 20 MG tablet TAKE 1 TABLET(20 MG) BY MOUTH DAILY Patient taking differently: Take 20 mg by mouth daily. 05/26/21  Yes Josue Hector, MD  glucose blood test strip One Touch Ultra strips. Use to test blood sugar three times daily. Dx: E11.65 Patient taking differently: daily. 12/03/20  Yes [name], MD  LEVEMIR FLEXTOUCH 100 UNIT/ML FlexPen ADMINISTER 30 UNITS UNDER THE SKIN AT BEDTIME Patient taking differently: Inject 30 Units into the skin at bedtime. 03/25/20  Yes Reed, Tiffany L, DO  MAGNESIUM PO Take 500 mg by mouth daily.    Yes [provider]  metoprolol tartrate (LOPRESSOR) 25 MG tablet Take 1 tablet (25 mg total) by mouth as needed (use as needed for HR greater than 110). 08/21/19  Yes Kathyrn Drown D, NP  Multiple Vitamins-Minerals (MULTIVITAMIN WITH MINERALS) tablet Take 1 tablet by mouth daily.   Yes [provider]  Omega-3 Fatty Acids (FISH OIL PO) Take 1,000 mg by mouth daily.   Yes [provider]  pantoprazole (PROTONIX) 40 MG tablet TAKE 1 TABLET(40 MG) BY MOUTH DAILY Patient taking differently: Take 40 mg by mouth daily. 05/26/21  Yes [name], MD  Polyethyl Glycol-Propyl Glycol (SYSTANE OP) Place 1 drop into both eyes at bedtime.   Yes [provider]  Probiotic Product (PROBIOTIC DAILY  PO) Take 1 capsule by mouth daily. Digestive Advantage Probiotic   Yes [provider]  XIFAXAN 550 MG TABS tablet Take 1 tablet (550 mg total) by mouth 2 (two) times daily. 04/05/21  Yes [name], MD  fluticasone [facility]) 50 MCG/ACT nasal spray Place 1 spray into both nostrils daily for 3 days. Patient not taking: Reported on 05/29/2021 05/09/21 05/12/21  Teodora Medici, FNP  Insulin Pen Needle (BD PEN NEEDLE NANO U/F) 32G X 4 MM MISC USE THREE TIMES DAILY AS DIRECTED 10/14/19   Reed, Tiffany L, DO  Insulin Syringe-Needle U-100  (INSULIN SYRINGE 1CC/31GX5/16") 31G X 5/16" 1 ML MISC USE AS DIRECTED TO INJECT LEVIMIR 07/29/20   [name], MD      Allergies    Kiwi extract, Tdap [tetanus-diphth-acell pertussis], Statins, Latex, and Tramadol    Review of Systems   Review of Systems  Constitutional:  Negative for chills and fever.  HENT: Negative.    Respiratory:  Negative for cough and shortness of breath.   Cardiovascular:  Negative for chest pain.  Gastrointestinal:  Positive for abdominal pain and nausea. Negative for blood in stool, constipation, diarrhea and vomiting.  Genitourinary:  Positive for flank pain. Negative for dysuria, frequency and hematuria.  Musculoskeletal:  Positive for back pain.  Neurological:  Negative for dizziness, syncope and light-headedness.  All other systems reviewed and are negative.  Physical Exam Updated Vital Signs BP (!) 167/71 (BP Location: Left Arm)    Pulse 79    Temp 99.2 F (37.3 C) (Oral)    Resp 18    Ht 5' 3"  (1.6 m)    Wt 59.2 kg    LMP  (LMP Unknown)    SpO2 97%    BMI 23.12 kg/m  Physical Exam Vitals and nursing note reviewed.  Constitutional:      General: She is not in acute distress.    Appearance: Normal appearance. She is well-developed. She is not diaphoretic.     Comments: Alert, appears uncomfortable, chronically ill-appearing but not in acute distress  HENT:     Head: Normocephalic and atraumatic.     Mouth/Throat:     Mouth: Mucous membranes are moist.     Pharynx: Oropharynx is clear.  Eyes:     General:        Right eye: No discharge.        Left eye: No discharge.     Pupils: Pupils are equal, round, and reactive to light.  Cardiovascular:     Rate and Rhythm: Normal rate and regular rhythm.     Pulses: Normal pulses.     Heart sounds: Normal heart sounds.  Pulmonary:     Effort: Pulmonary effort is normal. No respiratory distress.     Breath sounds: Normal breath sounds. No wheezing or rales.     Comments: Respirations equal and  unlabored, patient able to speak in full sentences, lungs clear to auscultation bilaterally  Abdominal:     General: Bowel sounds are normal. There is no distension.     Palpations: Abdomen is soft. There is no mass.     Tenderness: There is abdominal tenderness. There is no guarding.     Comments: Abdomen soft, nondistended, tenderness in the left side of the abdomen primarily in the left lower quadrant there is also left CVA tenderness, no right-sided abdominal tenderness  Musculoskeletal:        General: No deformity.     Cervical back: Neck supple.  Skin:    General: Skin is warm and dry.     Capillary Refill: Capillary refill takes less than 2 seconds.  Neurological:     Mental Status: She is alert and oriented to person, place, and time.     Coordination: Coordination normal.     Comments: Speech is clear, able to follow commands Moves extremities without ataxia, coordination intact  Psychiatric:        Mood and Affect: Mood normal.        Behavior: Behavior normal.    ED Results / Procedures / Treatments   Labs (all labs ordered are listed, but only abnormal results are displayed) Labs Reviewed  COMPREHENSIVE METABOLIC PANEL - Abnormal; Notable for the following components:      Result Value   Glucose, Bld 211 (*)    Albumin 3.0 (*)    AST 90 (*)    ALT 51 (*)    Total Bilirubin 2.1 (*)    All other components within normal limits  CBC - Abnormal; Notable for the following components:   Platelets 122 (*)    All other components within normal limits  URINALYSIS, ROUTINE W REFLEX MICROSCOPIC - Abnormal; Notable for the following components:   APPearance CLOUDY (*)    Glucose, UA >=500 (*)    Hgb urine dipstick SMALL (*)    Leukocytes,Ua LARGE (*)    WBC, UA >50 (*)    Bacteria, UA MANY (*)    All other components within normal limits  CBC - Abnormal; Notable for the following components:   Platelets 114 (*)    All other components within normal limits   COMPREHENSIVE METABOLIC PANEL - Abnormal; Notable for the following components:   Glucose, Bld 134 (*)    Albumin 2.7 (*)    AST 63 (*)    Total Bilirubin 2.3 (*)    All other components within normal limits  GLUCOSE, CAPILLARY - Abnormal; Notable for the following components:   Glucose-Capillary 156 (*)    All other components within normal limits  GLUCOSE, CAPILLARY - Abnormal; Notable for the following components:   Glucose-Capillary 132 (*)    All other components within normal limits  GLUCOSE, CAPILLARY - Abnormal; Notable for the following components:   Glucose-Capillary 120 (*)    All other components within normal limits  GLUCOSE, CAPILLARY - Abnormal; Notable for the following components:   Glucose-Capillary 103 (*)    All other components within normal limits  GLUCOSE, CAPILLARY - Abnormal; Notable for the following components:   Glucose-Capillary 105 (*)    All other components within normal limits  GLUCOSE, CAPILLARY - Abnormal; Notable for the following components:   Glucose-Capillary 111 (*)    All other components within normal limits  GLUCOSE, CAPILLARY - Abnormal; Notable for the following components:   Glucose-Capillary 120 (*)    All other components within normal limits  RESP PANEL BY RT-PCR (FLU A&B, COVID) ARPGX2  SURGICAL PCR SCREEN  URINE CULTURE  URINE CULTURE  LIPASE, BLOOD  PROTIME-INR  HEMOGLOBIN A1C    EKG None  Radiology CT Abdomen Pelvis W Contrast  Result Date: 05/29/2021 CLINICAL DATA:  Left side abdominal pain. Flank pain, kidney stone suspected EXAM: CT ABDOMEN AND PELVIS WITH CONTRAST TECHNIQUE: Multidetector CT imaging of the abdomen and pelvis was performed using the standard protocol following bolus administration of intravenous contrast. RADIATION DOSE REDUCTION: This exam was performed according to the departmental dose-optimization program which includes automated exposure control, adjustment of the mA  and/or kV according to  patient size and/or use of iterative reconstruction technique. CONTRAST:  140m OMNIPAQUE IOHEXOL 300 MG/ML  SOLN COMPARISON:  04/06/2019 FINDINGS: Lower chest: No acute abnormality. Hepatobiliary: TIPS shunt noted within the liver. Changes of cirrhosis. No focal hepatic abnormality. Gallbladder grossly unremarkable. Pancreas: No focal abnormality or ductal dilatation. Spleen: Splenomegaly with a craniocaudal length of 13.7  cm. Adrenals/Urinary Tract: Left hydronephrosis due to 4 mm proximal left ureteral stone. No stones or hydronephrosis on the right. Adrenal glands and urinary bladder unremarkable. Stomach/Bowel: Stomach, large and small bowel grossly unremarkable. Vascular/Lymphatic: No evidence of aneurysm or adenopathy. Reproductive: Prior hysterectomy.  No adnexal masses. Other: Trace free fluid in the pelvis.  No free air. Musculoskeletal: No acute bony abnormality. IMPRESSION: 4 mm proximal left ureteral stone with mild left hydronephrosis. Cirrhosis with associated splenomegaly.  Prior TIPS. Electronically Signed   By: KRolm BaptiseM.D.   On: 05/29/2021 22:12    Procedures Procedures    Medications Ordered in ED Medications  cefTRIAXone (ROCEPHIN) 1 g in sodium chloride 0.9 % 100 mL IVPB ( Intravenous MAR Unhold 05/30/21 1021)  morphine 2 MG/ML injection 2-4 mg ( Intravenous MAR Unhold 05/30/21 1021)  lactated ringers infusion ( Intravenous Restarted 05/30/21 1036)  insulin detemir (LEVEMIR) injection 15 Units ( Subcutaneous MAR Unhold 05/30/21 1021)  rifaximin (XIFAXAN) tablet 550 mg (550 mg Oral Given 05/30/21 1036)  pantoprazole (PROTONIX) EC tablet 40 mg (40 mg Oral Given 05/30/21 1036)  multivitamin with minerals tablet 1 tablet (1 tablet Oral Given 05/30/21 1036)  polyvinyl alcohol (LIQUIFILM TEARS) 1.4 % ophthalmic solution ( Both Eyes MAR Unhold 05/30/21 1021)  ezetimibe (ZETIA) tablet 10 mg (10 mg Oral Given 05/30/21 1035)  acetaminophen (TYLENOL) tablet 650 mg ( Oral MAR Unhold 05/30/21  1021)    Or  acetaminophen (TYLENOL) suppository 650 mg ( Rectal MAR Unhold 05/30/21 1021)  ondansetron (ZOFRAN) tablet 4 mg ( Oral MAR Unhold 05/30/21 1021)    Or  ondansetron (ZOFRAN) injection 4 mg ( Intravenous MAR Unhold 05/30/21 1021)  lactulose (CHRONULAC) 10 GM/15ML solution 20 g (20 g Oral Given 05/30/21 1036)  insulin aspart (novoLOG) injection 0-9 Units (0 Units Subcutaneous Not Given 05/30/21 1133)  Gerhardt's butt cream (has no administration in time range)  sodium chloride 0.9 % bolus 500 mL (0 mLs Intravenous Stopped 05/29/21 2224)  ondansetron (ZOFRAN) injection 4 mg (4 mg Intravenous Given 05/29/21 2146)  fentaNYL (SUBLIMAZE) injection 50 mcg (50 mcg Intravenous Given 05/29/21 2146)  cefTRIAXone (ROCEPHIN) 1 g in sodium chloride 0.9 % 100 mL IVPB (0 g Intravenous Stopped 05/29/21 2241)  iohexol (OMNIPAQUE) 300 MG/ML solution 100 mL (100 mLs Intravenous Contrast Given 05/29/21 2153)  morphine 4 MG/ML injection 4 mg (4 mg Intravenous Given 05/29/21 2258)  chlorhexidine (PERIDEX) 0.12 % solution 15 mL (15 mLs Mouth/Throat Given 05/30/21 0819)    Or  MEDLINE mouth rinse ( Mouth Rinse See Alternative 05/30/21 03664    ED Course/ Medical Decision Making/ A&P                           ESheelah Ritaccois a 76y.o. female presents to the ED for concern of left flank and abdominal pain, this involves an extensive number of treatment options, and is a complaint that carries with it a high risk of complications and morbidity.  The differential diagnosis includes renal stone, pyelonephritis, renal abscess, diverticulitis, colitis, bowel obstruction, perforation   Additional history obtained:  Additional history obtained from husband at bedside External records from outside source obtained and reviewed including prior ED and PCP visits   Lab Tests:  I Ordered, reviewed, and interpreted labs.  The pertinent results include:  no leukocytosis, glucose 211, but no other significant  electrolyte derangements, LFTs at baseline, normal renal function. UA concerning for infection, culture sent   Imaging Studies ordered:  I ordered imaging studies including CT abdomen pelvis  I independently visualized and interpreted imaging which showed 73m proximal ureteral stone with mild hydronephrosis on the left I agree with the radiologist interpretation   Cardiac Monitoring:  The patient was maintained on a cardiac monitor.  I personally viewed and interpreted the cardiac monitored which showed an underlying rhythm of: NSR   Medicines ordered and prescription drug management:  I ordered medication including IV morphine, zofran, fluid bolus and IV rocephin for symptomatic management and treatment of UTI  Reevaluation of the patient after these medicines showed that the patient improved I have reviewed the patients home medicines and have made adjustments as needed   Critical Interventions:  IV Rocephin for pyelonephritis, infected stone   ED Course:  Work up is consistent with infected ureteral stone, renal function is currently preserved. Pt with low grade temp, but des not appear septic at this time, will consult urology   Consultations Obtained:  I requested consultation with the the urologist, Dr. WLovena Neighbours  and discussed lab and imaging findings as well as pertinent plan - they recommend: medicine admission, continued antibiotics, and will plan to take pt for likely stent placement in the morning   Reevaluation:  After the interventions noted above, I reevaluated the patient and found that they have :improved   Dispostion:  After consideration of the diagnostic results and the patients response to treatment feel that the patent would benefit from admission. Hospitalists service consulted for admission. Discussed labs, imaging and urology recommendations with Dr. GHurley Ciscowho will see and admit the pt.         Final Clinical Impression(s) / ED  Diagnoses Final diagnoses:  Acute UTI  Ureteral stone with hydronephrosis    Rx / DC Orders ED Discharge Orders     None         FJacqlyn Larsen PVermont01/25/23 1637    BMalvin Johns MD 06/11/21 [phone]

## 2021-05-30 NOTE — Anesthesia Preprocedure Evaluation (Addendum)
Anesthesia Evaluation  Patient identified by MRN, date of birth, ID band Patient awake    Reviewed: Allergy & Precautions, NPO status , Patient's Chart, lab work & pertinent test results  Airway Mallampati: II  TM Distance: >3 FB Neck ROM: Full    Dental no notable dental hx.    Pulmonary neg pulmonary ROS,    Pulmonary exam normal        Cardiovascular hypertension, Pt. on medications and Pt. on home beta blockers + CAD, + Past MI, + CABG (09/17) and +CHF  + dysrhythmias Atrial Fibrillation + Valvular Problems/Murmurs  Rhythm:Regular Rate:Normal     Neuro/Psych negative neurological ROS     GI/Hepatic GERD  Medicated,(+) Cirrhosis  (s/p banding and TIPS)  Esophageal Varices    ,   Endo/Other  diabetes, Type 2, Insulin Dependent  Renal/GU ARFRenal disease     Musculoskeletal   Abdominal Normal abdominal exam  (+)   Peds  Hematology  (+) anemia ,   Anesthesia Other Findings   Reproductive/Obstetrics                            Anesthesia Physical Anesthesia Plan  ASA: 3  Anesthesia Plan: General   Post-op Pain Management:    Induction: Intravenous  PONV Risk Score and Plan: 3 and Ondansetron and Treatment may vary due to age or medical condition  Airway Management Planned: Mask and LMA  Additional Equipment: None  Intra-op Plan:   Post-operative Plan: Extubation in OR  Informed Consent: I have reviewed the patients History and Physical, chart, labs and discussed the procedure including the risks, benefits and alternatives for the proposed anesthesia with the patient or authorized representative who has indicated his/her understanding and acceptance.   Patient has DNR.   Dental advisory given  Plan Discussed with:   Anesthesia Plan Comments: (ECHO 04/21: 1. Left ventricular ejection fraction, by estimation, is 60 to 65%. The  left ventricle has normal function. The  left ventricle has no regional  wall motion abnormalities. Left ventricular diastolic parameters are  consistent with Grade II diastolic  dysfunction (pseudonormalization). Elevated left atrial pressure.  2. Right ventricular systolic function is normal. The right ventricular  size is normal. There is normal pulmonary artery systolic pressure. The  estimated right ventricular systolic pressure is 27.2 mmHg.  3. Left atrial size was mildly dilated.  4. The mitral valve is normal in structure. Mild mitral valve  regurgitation.  5. Tricuspid valve regurgitation is mild to moderate.  6. The aortic valve is normal in structure. Aortic valve regurgitation is  trivial.  7. The inferior vena cava is normal in size with greater than 50%  respiratory variability, suggesting right atrial pressure of 3 mmHg.   Comparison(s): Prior images unable to be directly viewed, comparison made  by report only.  Lab Results      Component                Value               Date                      WBC                      9.7                 05/30/2021  HGB                      13.4                05/30/2021                HCT                      41.0                05/30/2021                MCV                      95.1                05/30/2021                PLT                      114 (L)             05/30/2021           Lab Results      Component                Value               Date                      NA                       138                 05/30/2021                K                        3.6                 05/30/2021                CO2                      22                  05/30/2021                GLUCOSE                  134 (H)             05/30/2021                BUN                      16                  05/30/2021                CREATININE               0.83                05/30/2021                CALCIUM  9.1                 05/30/2021                 GFRNONAA                 >60                 05/30/2021          )      Anesthesia Quick Evaluation

## 2021-05-30 NOTE — Anesthesia Procedure Notes (Signed)
Procedure Name: LMA Insertion Date/Time: 05/30/2021 8:59 AM Performed by: Lance Coon, CRNA Pre-anesthesia Checklist: Patient identified, Emergency Drugs available, Suction available, Patient being monitored and Timeout performed Patient Re-evaluated:Patient Re-evaluated prior to induction Oxygen Delivery Method: Circle system utilized Preoxygenation: Pre-oxygenation with 100% oxygen Induction Type: IV induction LMA: LMA inserted LMA Size: 4.0 Number of attempts: 2 Placement Confirmation: positive ETCO2 and breath sounds checked- equal and bilateral Tube secured with: Tape Dental Injury: Teeth and Oropharynx as per pre-operative assessment  Comments: LMA #3 would no seat properly, LMA #4 successful

## 2021-05-30 NOTE — Consult Note (Signed)
WOC Nurse Consult Note: Reason for Consult: small chronic skin lesion on right medial buttock. Currently crusted Wound type: hyperkeratosis, response to traumatic injury, suspected scratching Pressure Injury POA: N/A Measurement: <1cm round. No depth.  Mildly elevated, hyperkeratotic tissue Wound bed:N/A Drainage (amount, consistency, odor) None Periwound: intact, clear Dressing procedure/placement/frequency: I will provide the patient with Gerhart's Butt Cream, a compounded 1:1:1 preparation of zinc oxide, hydrocortisone and lotrimin cream. This can be applied 3 times daily.  Patient would benefit from turning and repositioning from side to sie and minimizing time in the supine position.  She can follow up with her PCP post discharge.  [facility] nursing team will not follow, but will remain available to this patient, the nursing and medical teams.  Please re-consult if needed. Thanks, Maudie Flakes, MSN, RN, [place], Arther Abbott  Pager# [phone]

## 2021-05-30 NOTE — H&P (Addendum)
History and Physical    [name] OZD:[id number] DOB: 20-Oct-1945 DOA: 05/29/2021  PCP: [name], MD  Patient coming from: Home  I have personally briefly reviewed patient's old medical records in [place]  Chief Complaint: Back pain  HPI: [name] is a 76 y.o. female with medical history significant of cirrhosis, DM2, HTN.  H/o hepatic encephalopathy on lactulose and rifaxan now.  H/o bleeding esophageal varices s/p banding and TIPS.  Pt presents to ED today with c/o L sided abdominal and back pain.  Onset when she woke up.  Has nausea, no vomiting.  No diarrhea, constipation, nor urinary symptoms.   ED Course: Tm 99.3, nl WBC  Pt with UTI and 70m proximal L ureteral stone with mild L hydro.   Past Medical History:  Diagnosis Date   A-fib ([facility]    08/19/19 afib with RVR, spontaneously converted to SR   Allergy    Arthritis    neck   Cataract    bilateral - MD monitoring cataracts   CHF (congestive heart failure) (HCC)    Chronic kidney disease, stage I    DR OTTELIN  HX UTIS   Cirrhosis ([facility]    Coronary artery disease    s/p CABG 01/29/16: LIMA-LAD, SVG-RI, SVG-dRCA   COVID-19 virus infection 04/2020   Cramp of limb    Diabetes mellitus    Dysphagia, unspecified(787.20)    Dysuria    Epistaxis    GERD (gastroesophageal reflux disease)    Hearing loss    wears hearing aids   Heart murmur     DX FOR YEARS ASYMPTOMATIC   Hepatic encephalopathy    Lumbago    Myocardial infarction ([facility]    NSTEMI 01/27/16   Neoplasm of uncertain behavior of skin    Nonspecific elevation of levels of transaminase or lactic acid dehydrogenase (LDH)    Osteoarthrosis, unspecified whether generalized or localized, unspecified site    Other and unspecified hyperlipidemia    diet controlled   Pain in joint, shoulder region    Paresthesias 04/01/2015   Postablative ovarian failure    Trochanteric bursitis of left hip 12/15/2015   Type 2  diabetes mellitus without complication ([facility]    Unspecified essential hypertension    no meds   Wears glasses     Past Surgical History:  Procedure Laterality Date   BACK SURGERY  06/2015   BREAST BIOPSY Left 12/18/2019   CARDIAC CATHETERIZATION N/A 01/27/2016   Procedure: Left Heart Cath and Coronary Angiography;  Surgeon: HBelva Crome MD;  [facility];  Service: Cardiovascular;  Laterality: N/A;   COLONOSCOPY  2012   Dr OLajoyce Corners    COLONOSCOPY WITH PROPOFOL N/A 07/07/2016   Procedure: COLONOSCOPY WITH PROPOFOL;  Surgeon: DMilus Banister MD;  Location: [facility];  Service: Endoscopy;  Laterality: N/A;   CORONARY ARTERY BYPASS GRAFT N/A 01/28/2016   Procedure: CORONARY ARTERY BYPASS GRAFTING (CABG) x 3 USING RIGHT LEG GREATER SAPHENOUS VEIN GRAFT;  Surgeon: SMelrose Nakayama MD;  [place] [facility]  Service: Open Heart Surgery;  Laterality: N/A;   ENDOVEIN HARVEST OF GREATER SAPHENOUS VEIN Right 01/28/2016   Procedure: ENDOVEIN HARVEST OF GREATER SAPHENOUS VEIN;  Surgeon: SMelrose Nakayama MD;  [place] [facility]  Service: Open Heart Surgery;  Laterality: Right;   ESOPHAGEAL BANDING  03/28/2019   Procedure: ESOPHAGEAL BANDING;  Surgeon: JMilus Banister MD;  [facility]: [facility];  Service: Endoscopy;;   ESOPHAGEAL BANDING  04/07/2019  Procedure: ESOPHAGEAL BANDING;  Surgeon: Juanita Craver, MD;  Location: [facility] ENDOSCOPY;  Service: Endoscopy;;   ESOPHAGOGASTRODUODENOSCOPY N/A 04/07/2019   Procedure: ESOPHAGOGASTRODUODENOSCOPY (EGD);  Surgeon: Juanita Craver, MD;  [facility]COPY;  Service: Endoscopy;  Laterality: N/A;   ESOPHAGOGASTRODUODENOSCOPY (EGD) WITH PROPOFOL N/A 07/07/2016   Procedure: ESOPHAGOGASTRODUODENOSCOPY (EGD) WITH PROPOFOL;  Surgeon: [name], MD;  [facility];  Service: Endoscopy;  Laterality: N/A;   ESOPHAGOGASTRODUODENOSCOPY (EGD) WITH PROPOFOL N/A 03/28/2019   Procedure: ESOPHAGOGASTRODUODENOSCOPY (EGD) WITH PROPOFOL;   Surgeon: [name], MD;  [facility];  Service: Endoscopy;  Laterality: N/A;   HEMOSTASIS CLIP PLACEMENT  04/07/2019   Procedure: HEMOSTASIS CLIP PLACEMENT;  Surgeon: Juanita Craver, MD;  Location: [facility];  Service: Endoscopy;;   IR ANGIOGRAM SELECTIVE EACH ADDITIONAL VESSEL  04/08/2019   IR EMBO ART  VEN HEMORR LYMPH EXTRAV  INC GUIDE ROADMAPPING  04/08/2019   IR PARACENTESIS  04/08/2019   IR RADIOLOGIST EVAL & MGMT  06/13/2019   IR RADIOLOGIST EVAL & MGMT  09/25/2019   IR RADIOLOGIST EVAL & MGMT  07/07/2020   IR TIPS  04/08/2019   MAXIMUM ACCESS (MAS)POSTERIOR LUMBAR INTERBODY FUSION (PLIF) 1 LEVEL Left 06/10/2015   Procedure: FOR MAXIMUM ACCESS (MAS) POSTERIOR LUMBAR INTERBODY FUSION (PLIF) LUMBAR THREE-FOUR EXTRAFORAMINAL MICRODISCECTOMY LUMBAR FIVE-SACRAL ONE LEFT;  Surgeon: Eustace Moore, MD;  [facility]: [facility];  Service: Neurosurgery;  Laterality: Left;   RADIOLOGY WITH ANESTHESIA N/A 04/08/2019   Procedure: RADIOLOGY WITH ANESTHESIA;  Surgeon: Radiologist, Medication, MD;  Location: [place];  Service: Radiology;  Laterality: N/A;   SCLEROTHERAPY  04/07/2019   Procedure: SCLEROTHERAPY;  Surgeon: Juanita Craver, MD;  Location: [facility] ENDOSCOPY;  Service: Endoscopy;;   TEE WITHOUT CARDIOVERSION N/A 01/28/2016   Procedure: TRANSESOPHAGEAL ECHOCARDIOGRAM (TEE);  Surgeon: Melrose Nakayama, MD;  Location: [place];  Service: Open Heart Surgery;  Laterality: N/A;   TUBAL LIGATION  1982   Dr Lynwood Dawley HERNIA REPAIR N/A 09/10/2019   Procedure: HERNIA REPAIR UMBILICAL ADULT;  Surgeon: Donnie Mesa, MD;  Location: [place];  Service: General;  Laterality: N/A;   UPPER GASTROINTESTINAL ENDOSCOPY     VAGINAL HYSTERECTOMY  1997   Dr Rande Lawman     reports that she has never smoked. She has never used smokeless tobacco. She reports that she does not drink alcohol and does not use drugs.  Allergies  Allergen Reactions   Kiwi Extract Anaphylaxis   Tdap  [Tetanus-Diphth-Acell Pertussis] Swelling and Other (See Comments)    Swelling at injection site, gets very hot   Statins Other (See Comments)    RHABDOMYOLYSIS   Latex Itching, Dermatitis and Rash   Tramadol Nausea And Vomiting    Family History  Problem Relation Age of Onset   Heart disease Mother    Breast cancer Mother    Lung cancer Father    Arthritis Sister    Arthritis Brother    Liver cancer Brother    Breast cancer Maternal Aunt    Breast cancer Paternal Aunt    Heart disease Maternal Grandmother    Heart disease Maternal Grandfather    Heart disease Paternal Grandmother    Heart disease Paternal Grandfather    Other Daughter        house fire   Colon cancer Neg Hx    Esophageal cancer Neg Hx    Rectal cancer Neg Hx    Stomach cancer Neg Hx     Prior to Admission medications   Medication  Sig Start Date End Date Taking? Authorizing Provider  acetaminophen (TYLENOL) 500 MG tablet Take 1,000 mg by mouth at bedtime.   Yes [provider]  benzonatate (TESSALON) 100 MG capsule Take 1 capsule (100 mg total) by mouth every 8 (eight) hours as needed for cough. 05/09/21  Yes Mound, Hildred Alamin E, FNP  Biotin 10000 MCG TABS Take 10,000 mcg by mouth daily.   Yes [provider]  Calcium Carb-Cholecalciferol (CALCIUM 600 + D PO) Take 1 tablet by mouth in the morning and at bedtime.   Yes [provider]  Cholecalciferol (VITAMIN D-3 PO) Take 1,000 Units by mouth daily.   Yes [provider]  Cyanocobalamin (VITAMIN B 12 PO) Take 1,000 mcg by mouth daily.   Yes [provider]  empagliflozin (JARDIANCE) 25 MG TABS tablet Take 1 tablet (25 mg total) by mouth daily. 01/26/21  Yes [name], MD  ezetimibe (ZETIA) 10 MG tablet Take 1 tablet (10 mg total) by mouth daily. 01/26/21  Yes [name], MD  furosemide (LASIX) 20 MG tablet TAKE 1 TABLET(20 MG) BY MOUTH DAILY Patient taking differently: Take 20 mg by mouth daily. 05/26/21  Yes  Josue Hector, MD  glucose blood test strip One Touch Ultra strips. Use to test blood sugar three times daily. Dx: E11.65 Patient taking differently: daily. 12/03/20  Yes [name], MD  LEVEMIR FLEXTOUCH 100 UNIT/ML FlexPen ADMINISTER 30 UNITS UNDER THE SKIN AT BEDTIME Patient taking differently: Inject 30 Units into the skin at bedtime. 03/25/20  Yes Reed, Tiffany L, DO  MAGNESIUM PO Take 500 mg by mouth daily.    Yes [provider]  metoprolol tartrate (LOPRESSOR) 25 MG tablet Take 1 tablet (25 mg total) by mouth as needed (use as needed for HR greater than 110). 08/21/19  Yes Kathyrn Drown D, NP  Multiple Vitamins-Minerals (MULTIVITAMIN WITH MINERALS) tablet Take 1 tablet by mouth daily.   Yes [provider]  Omega-3 Fatty Acids (FISH OIL PO) Take 1,000 mg by mouth daily.   Yes [provider]  pantoprazole (PROTONIX) 40 MG tablet TAKE 1 TABLET(40 MG) BY MOUTH DAILY Patient taking differently: Take 40 mg by mouth daily. 05/26/21  Yes [name], MD  Polyethyl Glycol-Propyl Glycol (SYSTANE OP) Place 1 drop into both eyes at bedtime.   Yes [provider]  Probiotic Product (PROBIOTIC DAILY PO) Take 1 capsule by mouth daily. Digestive Advantage Probiotic   Yes [provider]  XIFAXAN 550 MG TABS tablet Take 1 tablet (550 mg total) by mouth 2 (two) times daily. 04/05/21  Yes [name], MD  fluticasone [facility]) 50 MCG/ACT nasal spray Place 1 spray into both nostrils daily for 3 days. Patient not taking: Reported on 05/29/2021 05/09/21 05/12/21  Teodora Medici, FNP  Insulin Pen Needle (BD PEN NEEDLE NANO U/F) 32G X 4 MM MISC USE THREE TIMES DAILY AS DIRECTED 10/14/19   Reed, Tiffany L, DO  Insulin Syringe-Needle U-100 (INSULIN SYRINGE 1CC/31GX5/16") 31G X 5/16" 1 ML MISC USE AS DIRECTED TO INJECT LEVIMIR 07/29/20   [name], MD    Physical Exam: Vitals:   05/29/21 2249 05/29/21 2258 05/30/21 0118 05/30/21 0120  BP: (!)  142/63  (!) 139/59   Pulse: 80  89   Resp: 18  18   Temp:  99.3 F (37.4 C) 98.5 F (36.9 C)   TempSrc:  Oral Oral   SpO2: 96%  95%   Weight:  59.1 kg  Height:    5' 3"  (1.6 m)    Constitutional: NAD, calm, comfortable Eyes: PERRL, lids and conjunctivae normal ENMT: Mucous membranes are moist. Posterior pharynx clear of any exudate or lesions.Normal dentition.  Neck: normal, supple, no masses, no thyromegaly Respiratory: clear to auscultation bilaterally, no wheezing, no crackles. Normal respiratory effort. No accessory muscle use.  Cardiovascular: Regular rate and rhythm, no murmurs / rubs / gallops. No extremity edema. 2+ pedal pulses. No carotid bruits.  Abdomen: L CVA tenderness Musculoskeletal: no clubbing / cyanosis. No joint deformity upper and lower extremities. Good ROM, no contractures. Normal muscle tone.  Skin: no rashes, lesions, ulcers. No induration Neurologic: CN 2-12 grossly intact. Sensation intact, DTR normal. Strength 5/5 in all 4.  Psychiatric: Normal judgment and insight. Alert and oriented x 3. Normal mood.    Labs on Admission: I have personally reviewed following labs and imaging studies  CBC: Recent Labs  Lab 05/29/21 1842  WBC 7.7  HGB 13.7  HCT 41.4  MCV 95.6  PLT 315*   Basic Metabolic Panel: Recent Labs  Lab 05/29/21 1842  NA 138  K 3.9  CL 103  CO2 24  GLUCOSE 211*  BUN 14  CREATININE 0.80  CALCIUM 9.9   GFR: Estimated Creatinine Clearance: 51 mL/min (by C-G formula based on SCr of 0.8 mg/dL). Liver Function Tests: Recent Labs  Lab 05/29/21 1842  AST 90*  ALT 51*  ALKPHOS 112  BILITOT 2.1*  PROT 7.0  ALBUMIN 3.0*   Recent Labs  Lab 05/29/21 1842  LIPASE 28   No results for input(s): AMMONIA in the last 168 hours. Coagulation Profile: Recent Labs  Lab 05/29/21 2301  INR 1.1   Cardiac Enzymes: No results for input(s): CKTOTAL, CKMB, CKMBINDEX, TROPONINI in the last 168 hours. BNP (last 3 results) No results  for input(s): PROBNP in the last 8760 hours. HbA1C: No results for input(s): HGBA1C in the last 72 hours. CBG: No results for input(s): GLUCAP in the last 168 hours. Lipid Profile: No results for input(s): CHOL, HDL, LDLCALC, TRIG, CHOLHDL, LDLDIRECT in the last 72 hours. Thyroid Function Tests: No results for input(s): TSH, T4TOTAL, FREET4, T3FREE, THYROIDAB in the last 72 hours. Anemia Panel: No results for input(s): VITAMINB12, FOLATE, FERRITIN, TIBC, IRON, RETICCTPCT in the last 72 hours. Urine analysis:    Component Value Date/Time   COLORURINE YELLOW 05/29/2021 1838   APPEARANCEUR CLOUDY (A) 05/29/2021 1838   LABSPEC 1.022 05/29/2021 1838   PHURINE 7.0 05/29/2021 1838   GLUCOSEU >=500 (A) 05/29/2021 1838   HGBUR SMALL (A) 05/29/2021 1838   BILIRUBINUR NEGATIVE 05/29/2021 1838   BILIRUBINUR negative 01/29/2021 1952   BILIRUBINUR Negative 08/26/2019 1024   KETONESUR NEGATIVE 05/29/2021 1838   PROTEINUR NEGATIVE 05/29/2021 1838   UROBILINOGEN 0.2 01/29/2021 1952   UROBILINOGEN 0.2 12/18/2011 1409   NITRITE NEGATIVE 05/29/2021 1838   LEUKOCYTESUR LARGE (A) 05/29/2021 1838    Radiological Exams on Admission: CT Abdomen Pelvis W Contrast  Result Date: 05/29/2021 CLINICAL DATA:  Left side abdominal pain. Flank pain, kidney stone suspected EXAM: CT ABDOMEN AND PELVIS WITH CONTRAST TECHNIQUE: Multidetector CT imaging of the abdomen and pelvis was performed using the standard protocol following bolus administration of intravenous contrast. RADIATION DOSE REDUCTION: This exam was performed according to the departmental dose-optimization program which includes automated exposure control, adjustment of the mA and/or kV according to patient size and/or use of iterative reconstruction technique. CONTRAST:  16m OMNIPAQUE IOHEXOL 300 MG/ML  SOLN COMPARISON:  04/06/2019 FINDINGS: Lower chest: No acute abnormality. Hepatobiliary: TIPS shunt noted within the liver. Changes of cirrhosis. No  focal hepatic abnormality. Gallbladder grossly unremarkable. Pancreas: No focal abnormality or ductal dilatation. Spleen: Splenomegaly with a craniocaudal length of 13.7  cm. Adrenals/Urinary Tract: Left hydronephrosis due to 4 mm proximal left ureteral stone. No stones or hydronephrosis on the right. Adrenal glands and urinary bladder unremarkable. Stomach/Bowel: Stomach, large and small bowel grossly unremarkable. Vascular/Lymphatic: No evidence of aneurysm or adenopathy. Reproductive: Prior hysterectomy.  No adnexal masses. Other: Trace free fluid in the pelvis.  No free air. Musculoskeletal: No acute bony abnormality. IMPRESSION: 4 mm proximal left ureteral stone with mild left hydronephrosis. Cirrhosis with associated splenomegaly.  Prior TIPS. Electronically Signed   By: Rolm Baptise M.D.   On: 05/29/2021 22:12    EKG: Independently reviewed.  Assessment/Plan Principal Problem:   Pyohydronephrosis Active Problems:   Essential hypertension   DM2 (diabetes mellitus, type 2) (HCC)   Cirrhosis of liver (HCC)   Acute lower UTI    Pyohydronephrosis - UTI + hydronephrosis of L kidney due to 60m ureteral stone Rocephin NPO IVF Morphine PRN pain Zofran PRN nausea UCx pending Repeat CBC, CMP in AM Urology planning on taking to OR for ureteral stent in AM HTN - Holding home BP meds due to risk of development of sepsis in setting of pyohydronephrosis Cirrhosis of liver - Cont rifaxan Cont lactulose 368monce daily DM2 - Half home levemir (15u QHS) Sensitive SSI Q4H Holding Jardiance due to UTI PAF - Currently NSR in the 80s. Presumably not on AC due to cirrhosis with h/o large esophageal varices + bleeding  DVT prophylaxis: SCDs - 1) h/o GIB, 2) h/o really bad esophageal varices with bleeding, 3) mild thrombocytopenia Code Status: DNR - confirmed with patient Family Communication: No family in room Disposition Plan: Home after urologic treatment and clearance as long as she remains  stable Consults called: Urology Admission status: Admit to inpatient  Severity of Illness: The appropriate patient status for this patient is INPATIENT. Inpatient status is judged to be reasonable and necessary in order to provide the required intensity of service to ensure the patient's safety. The patient's presenting symptoms, physical exam findings, and initial radiographic and laboratory data in the context of their chronic comorbidities is felt to place them at high risk for further clinical deterioration. Furthermore, it is not anticipated that the patient will be medically stable for discharge from the hospital within 2 midnights of admission.   * I certify that at the point of admission it is my clinical judgment that the patient will require inpatient hospital care spanning beyond 2 midnights from the point of admission due to high intensity of service, high risk for further deterioration and high frequency of surveillance required.*   Tennyson Kallen M. DO [facility]  How to contact the TR[facility] LLCttending or Consulting provider 7AZephyrhills Northr covering provider during after hours 7PWillow Hillfor this patient?  Check the care team in [facility] look for a) attending/consulting TRH provider listed and b) the [email or web address] Surgicaleam listed Log into [email or web address]  Amion Physician Scheduling and messaging for groups and whole hospitals  On call and physician scheduling software for group practices, residents, hospitalists and other medical providers for call, clinic, rotation and shift schedules. OnCall Enterprise is a hospital-wide system for scheduling doctors and paging doctors on call. EasyPlot is for scientific plotting and data analysis.  [email or web address]  and use [place]'s universal password to access.  If you do not have the password, please contact the hospital operator.  Locate the [facility] provider you are looking for under [facility] and page to a number that you can be directly reached. If you  still have difficulty reaching the provider, please page the [facility] (Director on Call) for the Hospitalists listed on amion for assistance.  05/30/2021, 1:54 AM

## 2021-05-30 NOTE — ED Notes (Signed)
Report given  to [employer] on 64m

## 2021-05-30 NOTE — Consult Note (Signed)
Urology Consult   Physician requesting consult: Steward Ros, MD  Reason for consult: Left ureteral stone  History of Present Illness: [name] is a 76 y.o. female who presented to the [facility] emergency department with a 24-hour of sharp, constant left-sided flank pain.  She had a CT stone study yesterday that revealed an obstructing 4 mm left proximal ureteral calculus.  She has multiple medical comorbidities as outlined below.  Currently, she states that her pain is modestly controlled, but has been coming in waves overnight.  She denies nausea/vomiting, fever/chills, dysuria or hematuria.  UA in the emergency department was found to have's features concerning for urinary tract infection.  She has a long history of kidney stones and required lithotripsy in the past.   Past Medical History:  Diagnosis Date   A-fib ([place])    08/19/19 afib with RVR, spontaneously converted to SR   Allergy    Arthritis    neck   Cataract    bilateral - MD monitoring cataracts   CHF (congestive heart failure) (HCC)    Chronic kidney disease, stage I    DR OTTELIN  HX UTIS   Cirrhosis ([place])    Coronary artery disease    s/p CABG 01/29/16: LIMA-LAD, SVG-RI, SVG-dRCA   COVID-19 virus infection 04/2020   Cramp of limb    Diabetes mellitus    Dysphagia, unspecified(787.20)    Dysuria    Epistaxis    GERD (gastroesophageal reflux disease)    Hearing loss    wears hearing aids   Heart murmur     DX FOR YEARS ASYMPTOMATIC   Hepatic encephalopathy    Lumbago    Myocardial infarction ([place])    NSTEMI 01/27/16   Neoplasm of uncertain behavior of skin    Nonspecific elevation of levels of transaminase or lactic acid dehydrogenase (LDH)    Osteoarthrosis, unspecified whether generalized or localized, unspecified site    Other and unspecified hyperlipidemia    diet controlled   Pain in joint, shoulder region    Paresthesias 04/01/2015   Postablative ovarian failure    Trochanteric bursitis  of left hip 12/15/2015   Type 2 diabetes mellitus without complication ([place])    Unspecified essential hypertension    no meds   Wears glasses     Past Surgical History:  Procedure Laterality Date   BACK SURGERY  06/2015   BREAST BIOPSY Left 12/18/2019   CARDIAC CATHETERIZATION N/A 01/27/2016   Procedure: Left Heart Cath and Coronary Angiography;  Surgeon: Belva Crome, MD;  Location: [place] [facility];  Service: Cardiovascular;  Laterality: N/A;   COLONOSCOPY  2012   Dr Lajoyce Corners.    COLONOSCOPY WITH PROPOFOL N/A 07/07/2016   Procedure: COLONOSCOPY WITH PROPOFOL;  Surgeon: [name], MD;  Location: [facility];  Service: Endoscopy;  Laterality: N/A;   CORONARY ARTERY BYPASS GRAFT N/A 01/28/2016   Procedure: CORONARY ARTERY BYPASS GRAFTING (CABG) x 3 USING RIGHT LEG GREATER SAPHENOUS VEIN GRAFT;  Surgeon: Melrose Nakayama, MD;  Location: [place];  Service: Open Heart Surgery;  Laterality: N/A;   ENDOVEIN HARVEST OF GREATER SAPHENOUS VEIN Right 01/28/2016   Procedure: ENDOVEIN HARVEST OF GREATER SAPHENOUS VEIN;  Surgeon: Melrose Nakayama, MD;  Location: [place];  Service: Open Heart Surgery;  Laterality: Right;   ESOPHAGEAL BANDING  03/28/2019   Procedure: ESOPHAGEAL BANDING;  Surgeon: [name], MD;  Location: [facility];  Service: Endoscopy;;   ESOPHAGEAL BANDING  04/07/2019   Procedure: ESOPHAGEAL  BANDING;  Surgeon: Juanita Craver, MD;  Location: [facility] ENDOSCOPY;  Service: Endoscopy;;   ESOPHAGOGASTRODUODENOSCOPY N/A 04/07/2019   Procedure: ESOPHAGOGASTRODUODENOSCOPY (EGD);  Surgeon: Juanita Craver, MD;  Location: [facility] ENDOSCOPY;  Service: Endoscopy;  Laterality: N/A;   ESOPHAGOGASTRODUODENOSCOPY (EGD) WITH PROPOFOL N/A 07/07/2016   Procedure: ESOPHAGOGASTRODUODENOSCOPY (EGD) WITH PROPOFOL;  Surgeon: [name], MD;  [facility];  Service: Endoscopy;  Laterality: N/A;   ESOPHAGOGASTRODUODENOSCOPY (EGD) WITH PROPOFOL N/A 03/28/2019   Procedure:  ESOPHAGOGASTRODUODENOSCOPY (EGD) WITH PROPOFOL;  Surgeon: [name], MD;  [facility];  Service: Endoscopy;  Laterality: N/A;   HEMOSTASIS CLIP PLACEMENT  04/07/2019   Procedure: HEMOSTASIS CLIP PLACEMENT;  Surgeon: Juanita Craver, MD;  Location: [facility];  Service: Endoscopy;;   IR ANGIOGRAM SELECTIVE EACH ADDITIONAL VESSEL  04/08/2019   IR EMBO ART  VEN HEMORR LYMPH EXTRAV  INC GUIDE ROADMAPPING  04/08/2019   IR PARACENTESIS  04/08/2019   IR RADIOLOGIST EVAL & MGMT  06/13/2019   IR RADIOLOGIST EVAL & MGMT  09/25/2019   IR RADIOLOGIST EVAL & MGMT  07/07/2020   IR TIPS  04/08/2019   MAXIMUM ACCESS (MAS)POSTERIOR LUMBAR INTERBODY FUSION (PLIF) 1 LEVEL Left 06/10/2015   Procedure: FOR MAXIMUM ACCESS (MAS) POSTERIOR LUMBAR INTERBODY FUSION (PLIF) LUMBAR THREE-FOUR EXTRAFORAMINAL MICRODISCECTOMY LUMBAR FIVE-SACRAL ONE LEFT;  Surgeon: Eustace Moore, MD;  Location: [facility];  Service: Neurosurgery;  Laterality: Left;   RADIOLOGY WITH ANESTHESIA N/A 04/08/2019   Procedure: RADIOLOGY WITH ANESTHESIA;  Surgeon: Radiologist, Medication, MD;  Location: [place];  Service: Radiology;  Laterality: N/A;   SCLEROTHERAPY  04/07/2019   Procedure: SCLEROTHERAPY;  Surgeon: Juanita Craver, MD;  Location: [facility] ENDOSCOPY;  Service: Endoscopy;;   TEE WITHOUT CARDIOVERSION N/A 01/28/2016   Procedure: TRANSESOPHAGEAL ECHOCARDIOGRAM (TEE);  Surgeon: Melrose Nakayama, MD;  Location: [place];  Service: Open Heart Surgery;  Laterality: N/A;   TUBAL LIGATION  1982   Dr Lynwood Dawley HERNIA REPAIR N/A 09/10/2019   Procedure: HERNIA REPAIR UMBILICAL ADULT;  Surgeon: Donnie Mesa, MD;  Location: [place];  Service: General;  Laterality: N/A;   UPPER GASTROINTESTINAL ENDOSCOPY     VAGINAL HYSTERECTOMY  1997   Dr Rande Lawman    Current [facility] Medications:  Home Meds:  Current Meds  Medication Sig   acetaminophen (TYLENOL) 500 MG tablet Take 1,000 mg by mouth at bedtime.   benzonatate (TESSALON) 100  MG capsule Take 1 capsule (100 mg total) by mouth every 8 (eight) hours as needed for cough.   Biotin 10000 MCG TABS Take 10,000 mcg by mouth daily.   Calcium Carb-Cholecalciferol (CALCIUM 600 + D PO) Take 1 tablet by mouth in the morning and at bedtime.   Cholecalciferol (VITAMIN D-3 PO) Take 1,000 Units by mouth daily.   Cyanocobalamin (VITAMIN B 12 PO) Take 1,000 mcg by mouth daily.   empagliflozin (JARDIANCE) 25 MG TABS tablet Take 1 tablet (25 mg total) by mouth daily.   ezetimibe (ZETIA) 10 MG tablet Take 1 tablet (10 mg total) by mouth daily.   furosemide (LASIX) 20 MG tablet TAKE 1 TABLET(20 MG) BY MOUTH DAILY (Patient taking differently: Take 20 mg by mouth daily.)   glucose blood test strip One Touch Ultra strips. Use to test blood sugar three times daily. Dx: E11.65 (Patient taking differently: daily.)   LEVEMIR FLEXTOUCH 100 UNIT/ML FlexPen ADMINISTER 30 UNITS UNDER THE SKIN AT BEDTIME (Patient taking differently: Inject 30 Units into the skin at bedtime.)   MAGNESIUM PO Take 500 mg  by mouth daily.    metoprolol tartrate (LOPRESSOR) 25 MG tablet Take 1 tablet (25 mg total) by mouth as needed (use as needed for HR greater than 110).   Multiple Vitamins-Minerals (MULTIVITAMIN WITH MINERALS) tablet Take 1 tablet by mouth daily.   Omega-3 Fatty Acids (FISH OIL PO) Take 1,000 mg by mouth daily.   pantoprazole (PROTONIX) 40 MG tablet TAKE 1 TABLET(40 MG) BY MOUTH DAILY (Patient taking differently: Take 40 mg by mouth daily.)   Polyethyl Glycol-Propyl Glycol (SYSTANE OP) Place 1 drop into both eyes at bedtime.   Probiotic Product (PROBIOTIC DAILY PO) Take 1 capsule by mouth daily. Digestive Advantage Probiotic   XIFAXAN 550 MG TABS tablet Take 1 tablet (550 mg total) by mouth 2 (two) times daily.    Scheduled Meds:  ezetimibe  10 mg Oral Daily   insulin aspart  0-9 Units Subcutaneous Q4H   insulin detemir  15 Units Subcutaneous QHS   lactulose  20 g Oral Daily   multivitamin with  minerals  1 tablet Oral Daily   pantoprazole  40 mg Oral Daily   polyvinyl alcohol   Both Eyes QHS   rifaximin  550 mg Oral BID   Continuous Infusions:  cefTRIAXone (ROCEPHIN)  IV     lactated ringers 75 mL/hr at 05/30/21 0137   PRN Meds:.acetaminophen **OR** acetaminophen, morphine injection, ondansetron **OR** ondansetron (ZOFRAN) IV  Allergies:  Allergies  Allergen Reactions   Kiwi Extract Anaphylaxis   Tdap [Tetanus-Diphth-Acell Pertussis] Swelling and Other (See Comments)    Swelling at injection site, gets very hot   Statins Other (See Comments)    RHABDOMYOLYSIS   Latex Itching, Dermatitis and Rash   Tramadol Nausea And Vomiting    Family History  Problem Relation Age of Onset   Heart disease Mother    Breast cancer Mother    Lung cancer Father    Arthritis Sister    Arthritis Brother    Liver cancer Brother    Breast cancer Maternal Aunt    Breast cancer Paternal Aunt    Heart disease Maternal Grandmother    Heart disease Maternal Grandfather    Heart disease Paternal Grandmother    Heart disease Paternal Grandfather    Other Daughter        house fire   Colon cancer Neg Hx    Esophageal cancer Neg Hx    Rectal cancer Neg Hx    Stomach cancer Neg Hx     Social History:  reports that she has never smoked. She has never used smokeless tobacco. She reports that she does not drink alcohol and does not use drugs.  ROS: A complete review of systems was performed.  All systems are negative except for pertinent findings as noted.  Physical Exam:  Vital signs in last 24 hours: Temp:  [97.6 F (36.4 C)-99.3 F (37.4 C)] 97.6 F (36.4 C) (01/22 0537) Pulse Rate:  [79-89] 85 (01/22 0537) Resp:  [17-18] 17 (01/22 0537) BP: (118-167)/(57-71) 118/57 (01/22 0537) SpO2:  [93 %-97 %] 93 % (01/22 0537) Weight:  [59.1 kg-59.2 kg] 59.2 kg (01/22 0700) Constitutional:  Alert and oriented, No acute distress Cardiovascular: Regular rate and rhythm, No JVD Respiratory:  Normal respiratory effort, Lungs clear bilaterally GI: Abdomen is soft, nontender, nondistended, no abdominal masses GU: No CVA tenderness Lymphatic: No lymphadenopathy Neurologic: Grossly intact, no focal deficits Psychiatric: Normal mood and affect  Laboratory Data:  Recent Labs    05/29/21 1842 05/30/21 0417  WBC 7.7 9.7  HGB 13.7 13.4  HCT 41.4 41.0  PLT 122* 114*    Recent Labs    05/29/21 1842 05/30/21 0417  NA 138 138  K 3.9 3.6  CL 103 106  GLUCOSE 211* 134*  BUN 14 16  CALCIUM 9.9 9.1  CREATININE 0.80 0.83     Results for orders placed or performed during the hospital encounter of 05/29/21 (from the past 24 hour(s))  Urinalysis, Routine w reflex microscopic Urine, Clean Catch     Status: Abnormal   Collection Time: 05/29/21  6:38 PM  Result Value Ref Range   Color, Urine YELLOW YELLOW   APPearance CLOUDY (A) CLEAR   Specific Gravity, Urine 1.022 1.005 - 1.030   pH 7.0 5.0 - 8.0   Glucose, UA >=500 (A) NEGATIVE mg/dL   Hgb urine dipstick SMALL (A) NEGATIVE   Bilirubin Urine NEGATIVE NEGATIVE   Ketones, ur NEGATIVE NEGATIVE mg/dL   Protein, ur NEGATIVE NEGATIVE mg/dL   Nitrite NEGATIVE NEGATIVE   Leukocytes,Ua LARGE (A) NEGATIVE   RBC / HPF 21-50 0 - 5 RBC/hpf   WBC, UA >50 (H) 0 - 5 WBC/hpf   Bacteria, UA MANY (A) NONE SEEN   Squamous Epithelial / LPF 0-5 0 - 5   WBC Clumps PRESENT    Mucus PRESENT   Lipase, blood     Status: None   Collection Time: 05/29/21  6:42 PM  Result Value Ref Range   Lipase 28 11 - 51 U/L  Comprehensive metabolic panel     Status: Abnormal   Collection Time: 05/29/21  6:42 PM  Result Value Ref Range   Sodium 138 135 - 145 mmol/L   Potassium 3.9 3.5 - 5.1 mmol/L   Chloride 103 98 - 111 mmol/L   CO2 24 22 - 32 mmol/L   Glucose, Bld 211 (H) 70 - 99 mg/dL   BUN 14 8 - 23 mg/dL   Creatinine, Ser 0.80 0.44 - 1.00 mg/dL   Calcium 9.9 8.9 - 10.3 mg/dL   Total Protein 7.0 6.5 - 8.1 g/dL   Albumin 3.0 (L) 3.5 - 5.0 g/dL    AST 90 (H) 15 - 41 U/L   ALT 51 (H) 0 - 44 U/L   Alkaline Phosphatase 112 38 - 126 U/L   Total Bilirubin 2.1 (H) 0.3 - 1.2 mg/dL   GFR, Estimated >60 >60 mL/min   Anion gap 11 5 - 15  CBC     Status: Abnormal   Collection Time: 05/29/21  6:42 PM  Result Value Ref Range   WBC 7.7 4.0 - 10.5 K/uL   RBC 4.33 3.87 - 5.11 MIL/uL   Hemoglobin 13.7 12.0 - 15.0 g/dL   HCT 41.4 36.0 - 46.0 %   MCV 95.6 80.0 - 100.0 fL   MCH 31.6 26.0 - 34.0 pg   MCHC 33.1 30.0 - 36.0 g/dL   RDW 13.9 11.5 - 15.5 %   Platelets 122 (L) 150 - 400 K/uL   nRBC 0.0 0.0 - 0.2 %  Resp Panel by RT-PCR (Flu A&B, Covid) Nasopharyngeal Swab     Status: None   Collection Time: 05/29/21 10:50 PM   Specimen: Nasopharyngeal Swab; Nasopharyngeal(NP) swabs in vial transport medium  Result Value Ref Range   SARS Coronavirus 2 by RT PCR NEGATIVE NEGATIVE   Influenza A by PCR NEGATIVE NEGATIVE   Influenza B by PCR NEGATIVE NEGATIVE  Protime-INR     Status: None   Collection Time: 05/29/21 11:01 PM  Result Value  Ref Range   Prothrombin Time 14.1 11.4 - 15.2 seconds   INR 1.1 0.8 - 1.2  Glucose, capillary     Status: Abnormal   Collection Time: 05/30/21  2:01 AM  Result Value Ref Range   Glucose-Capillary 156 (H) 70 - 99 mg/dL  Surgical PCR screen     Status: None   Collection Time: 05/30/21  2:56 AM   Specimen: Nasal Mucosa; Nasal Swab  Result Value Ref Range   MRSA, PCR NEGATIVE NEGATIVE   Staphylococcus aureus NEGATIVE NEGATIVE  CBC     Status: Abnormal   Collection Time: 05/30/21  4:17 AM  Result Value Ref Range   WBC 9.7 4.0 - 10.5 K/uL   RBC 4.31 3.87 - 5.11 MIL/uL   Hemoglobin 13.4 12.0 - 15.0 g/dL   HCT 41.0 36.0 - 46.0 %   MCV 95.1 80.0 - 100.0 fL   MCH 31.1 26.0 - 34.0 pg   MCHC 32.7 30.0 - 36.0 g/dL   RDW 14.2 11.5 - 15.5 %   Platelets 114 (L) 150 - 400 K/uL   nRBC 0.0 0.0 - 0.2 %  Comprehensive metabolic panel     Status: Abnormal   Collection Time: 05/30/21  4:17 AM  Result Value Ref Range    Sodium 138 135 - 145 mmol/L   Potassium 3.6 3.5 - 5.1 mmol/L   Chloride 106 98 - 111 mmol/L   CO2 22 22 - 32 mmol/L   Glucose, Bld 134 (H) 70 - 99 mg/dL   BUN 16 8 - 23 mg/dL   Creatinine, Ser 0.83 0.44 - 1.00 mg/dL   Calcium 9.1 8.9 - 10.3 mg/dL   Total Protein 6.6 6.5 - 8.1 g/dL   Albumin 2.7 (L) 3.5 - 5.0 g/dL   AST 63 (H) 15 - 41 U/L   ALT 44 0 - 44 U/L   Alkaline Phosphatase 94 38 - 126 U/L   Total Bilirubin 2.3 (H) 0.3 - 1.2 mg/dL   GFR, Estimated >60 >60 mL/min   Anion gap 10 5 - 15  Glucose, capillary     Status: Abnormal   Collection Time: 05/30/21  5:38 AM  Result Value Ref Range   Glucose-Capillary 132 (H) 70 - 99 mg/dL   Recent Results (from the past 240 hour(s))  Resp Panel by RT-PCR (Flu A&B, Covid) Nasopharyngeal Swab     Status: None   Collection Time: 05/29/21 10:50 PM   Specimen: Nasopharyngeal Swab; Nasopharyngeal(NP) swabs in vial transport medium  Result Value Ref Range Status   SARS Coronavirus 2 by RT PCR NEGATIVE NEGATIVE Final    Comment: (NOTE) SARS-CoV-2 target nucleic acids are NOT DETECTED.  The SARS-CoV-2 RNA is generally detectable in upper respiratory specimens during the acute phase of infection. The lowest concentration of SARS-CoV-2 viral copies this assay can detect is 138 copies/mL. A negative result does not preclude SARS-Cov-2 infection and should not be used as the sole basis for treatment or other patient management decisions. A negative result may occur with  improper specimen collection/handling, submission of specimen other than nasopharyngeal swab, presence of viral mutation(s) within the areas targeted by this assay, and inadequate number of viral copies(<138 copies/mL). A negative result must be combined with clinical observations, patient history, and epidemiological information. The expected result is Negative.  Fact Sheet for Patients:  EntrepreneurPulse.com.au  Fact Sheet for Healthcare Providers:   IncredibleEmployment.be  This test is no t yet approved or cleared by the [place] FDA and  has been  authorized for detection and/or diagnosis of SARS-CoV-2 by FDA under an Emergency Use Authorization (EUA). This EUA will remain  in effect (meaning this test can be used) for the duration of the COVID-19 declaration under Section 564(b)(1) of the Act, 21 U.S.C.section 360bbb-3(b)(1), unless the authorization is terminated  or revoked sooner.       Influenza A by PCR NEGATIVE NEGATIVE Final   Influenza B by PCR NEGATIVE NEGATIVE Final    Comment: (NOTE) The Xpert Xpress SARS-CoV-2/FLU/RSV plus assay is intended as an aid in the diagnosis of influenza from Nasopharyngeal swab specimens and should not be used as a sole basis for treatment. Nasal washings and aspirates are unacceptable for Xpert Xpress SARS-CoV-2/FLU/RSV testing.  Fact Sheet for Patients: EntrepreneurPulse.com.au  Fact Sheet for Healthcare Providers: IncredibleEmployment.be  This test is not yet approved or cleared by the [place] FDA and has been authorized for detection and/or diagnosis of SARS-CoV-2 by FDA under an Emergency Use Authorization (EUA). This EUA will remain in effect (meaning this test can be used) for the duration of the COVID-19 declaration under Section 564(b)(1) of the Act, 21 U.S.C. section 360bbb-3(b)(1), unless the authorization is terminated or revoked.  Performed at [facility], [place] [address] [place], [place] [address]   Surgical PCR screen     Status: None   Collection Time: 05/30/21  2:56 AM   Specimen: Nasal Mucosa; Nasal Swab  Result Value Ref Range Status   MRSA, PCR NEGATIVE NEGATIVE Final   Staphylococcus aureus NEGATIVE NEGATIVE Final    Comment: (NOTE) The Xpert SA Assay (FDA approved for NASAL specimens in patients 52 years of age and older), is one component of a comprehensive surveillance program.  It is not intended to diagnose infection nor to guide or monitor treatment. Performed at [facility], [place] [address], [place] [address]     Renal Function: Recent Labs    05/29/21 1842 05/30/21 0417  CREATININE 0.80 0.83   Estimated Creatinine Clearance: 49.2 mL/min (by C-G formula based on SCr of 0.83 mg/dL).  Radiologic Imaging: CT Abdomen Pelvis W Contrast  Result Date: 05/29/2021 CLINICAL DATA:  Left side abdominal pain. Flank pain, kidney stone suspected EXAM: CT ABDOMEN AND PELVIS WITH CONTRAST TECHNIQUE: Multidetector CT imaging of the abdomen and pelvis was performed using the standard protocol following bolus administration of intravenous contrast. RADIATION DOSE REDUCTION: This exam was performed according to the departmental dose-optimization program which includes automated exposure control, adjustment of the mA and/or kV according to patient size and/or use of iterative reconstruction technique. CONTRAST:  176m OMNIPAQUE IOHEXOL 300 MG/ML  SOLN COMPARISON:  04/06/2019 FINDINGS: Lower chest: No acute abnormality. Hepatobiliary: TIPS shunt noted within the liver. Changes of cirrhosis. No focal hepatic abnormality. Gallbladder grossly unremarkable. Pancreas: No focal abnormality or ductal dilatation. Spleen: Splenomegaly with a craniocaudal length of 13.7  cm. Adrenals/Urinary Tract: Left hydronephrosis due to 4 mm proximal left ureteral stone. No stones or hydronephrosis on the right. Adrenal glands and urinary bladder unremarkable. Stomach/Bowel: Stomach, large and small bowel grossly unremarkable. Vascular/Lymphatic: No evidence of aneurysm or adenopathy. Reproductive: Prior hysterectomy.  No adnexal masses. Other: Trace free fluid in the pelvis.  No free air. Musculoskeletal: No acute bony abnormality. IMPRESSION: 4 mm proximal left ureteral stone with mild left hydronephrosis. Cirrhosis with associated splenomegaly.  Prior TIPS. Electronically Signed   By: KRolm BaptiseM.D.   On: 05/29/2021 22:12    I independently reviewed the above imaging studies.  Impression/Recommendation 76 year old female with a 4 mm proximal left ureteral calculus associated with mild hydronephrosis and bacteriuria with possible UTI  -The risks, benefits and alternatives of cystoscopy with LEFT JJ stent placement was discussed with the patient.  Risks include, but are not limited to: bleeding, urinary tract infection, ureteral injury, ureteral stricture disease, chronic pain, urinary symptoms, bladder injury, stent migration, the need for nephrostomy tube placement, MI, CVA, DVT, PE and the inherent risks with general anesthesia.  The patient voices understanding and wishes to proceed.   Ellison Hughs, MD [facility] 05/30/2021, 7:32 AM

## 2021-05-31 ENCOUNTER — Encounter (HOSPITAL_COMMUNITY): Payer: Self-pay | Admitting: Urology

## 2021-05-31 DIAGNOSIS — N136 Pyonephrosis: Secondary | ICD-10-CM | POA: Diagnosis not present

## 2021-05-31 LAB — BASIC METABOLIC PANEL
Anion gap: 7 (ref 5–15)
BUN: 29 mg/dL — ABNORMAL HIGH (ref 8–23)
CO2: 21 mmol/L — ABNORMAL LOW (ref 22–32)
Calcium: 8.7 mg/dL — ABNORMAL LOW (ref 8.9–10.3)
Chloride: 105 mmol/L (ref 98–111)
Creatinine, Ser: 0.87 mg/dL (ref 0.44–1.00)
GFR, Estimated: >60 mL/min (ref >60)
Glucose, Bld: 262 mg/dL — ABNORMAL HIGH (ref 70–99)
Potassium: 4 mmol/L (ref 3.5–5.1)
Sodium: 133 mmol/L — ABNORMAL LOW (ref 135–145)

## 2021-05-31 LAB — URINE CULTURE

## 2021-05-31 LAB — GLUCOSE, CAPILLARY
Glucose-Capillary: 197 mg/dL — ABNORMAL HIGH (ref 70–99)
Glucose-Capillary: 205 mg/dL — ABNORMAL HIGH (ref 70–99)
Glucose-Capillary: 238 mg/dL — ABNORMAL HIGH (ref 70–99)
Glucose-Capillary: 259 mg/dL — ABNORMAL HIGH (ref 70–99)
Glucose-Capillary: 286 mg/dL — ABNORMAL HIGH (ref 70–99)
Glucose-Capillary: 341 mg/dL — ABNORMAL HIGH (ref 70–99)

## 2021-05-31 LAB — HEMOGLOBIN A1C
Hgb A1c MFr Bld: 9.6 % — ABNORMAL HIGH (ref 4.8–5.6)
Mean Plasma Glucose: 229 mg/dL

## 2021-05-31 MED ORDER — INSULIN ASPART 100 UNIT/ML IJ SOLN
0.0000 [IU] | Freq: Three times a day (TID) | INTRAMUSCULAR | Status: DC
  Administered 2021-05-31: 3 [IU] via SUBCUTANEOUS
  Administered 2021-05-31 (×2): 5 [IU] via SUBCUTANEOUS

## 2021-05-31 MED ORDER — SODIUM CHLORIDE 0.9 % IV SOLN: INTRAVENOUS | Status: DC

## 2021-05-31 MED ORDER — INSULIN ASPART 100 UNIT/ML IJ SOLN
0.0000 [IU] | Freq: Every day | INTRAMUSCULAR | Status: DC
  Administered 2021-05-31: 3 [IU] via SUBCUTANEOUS
  Administered 2021-05-31: 4 [IU] via SUBCUTANEOUS

## 2021-05-31 MED ORDER — INSULIN DETEMIR 100 UNIT/ML ~~LOC~~ SOLN
25.0000 [IU] | Freq: Every day | SUBCUTANEOUS | Status: DC
  Administered 2021-05-31: 25 [IU] via SUBCUTANEOUS
  Filled 2021-05-31 (×2): qty 0.25

## 2021-05-31 NOTE — Progress Notes (Signed)
PROGRESS NOTE    [name]  WYO:[id number] DOB: 18-Dec-1945 DOA: 05/29/2021 PCP: [name], MD   Brief Narrative: 76 year old past medical history significant for cirrhosis, diabetes type 2, hypertension, history of hepatic encephalopathy on lactulose and rifaximin, history of esophageal varices bleeding status post banding and TIPS, presents to the ED complaining of left-sided abdominal pain and back pain.  Reported nausea.  CT abdomen and pelvis showed 4 mm proximal left ureteral stone with mild left hydronephrosis.    Assessment & Plan:   Principal Problem:   Pyohydronephrosis Active Problems:   Essential hypertension   DM2 (diabetes mellitus, type 2) (HCC)   Cirrhosis of liver ([place])   Acute lower UTI  1-UTI, left ureteral calculus associated with mild hydronephrosis -Admitted for IV fluids and IV antibiotics. -Cystoscopy with stent placement 1/22 -Continue with IV antibiotics today hope to discharge patient tomorrow   Hypertension: Plan to resume Lasix at discharge  Cirrhosis of the liver: Continue with lactulose and rifaximin Mild transaminases in the setting of acute infection  Diabetes type 2, hyperglycemia uncontrolled Continue with Levemir, increased dose and a sliding scale insulin CBG overnight 470.  Plan to increase Levemir today.  AF: Not on anticoagulation due to cirrhosis and history of esophageal bleed Mild hyponatremia: Might be related to hyperglycemia.  Mild thrombocytopenia: Setting of cirrhosis  Estimated body mass index is 23.12 kg/m as calculated from the following:   Height as of this encounter: 5' 3"  (1.6 m).   Weight as of this encounter: 59.2 kg.   DVT prophylaxis: SCD Code Status: DNR Family Communication: Care discussed with patient Disposition Plan:  Status is: Inpatient  Remains inpatient appropriate because: To continue IV antibiotics, home 1/24        Consultants:  Urology  Procedures:  Cystoscopy  SP ureteral stent placement.   Antimicrobials:    Subjective: She  report improvement of back pain,  Objective: Vitals:   05/30/21 1032 05/30/21 1620 05/30/21 2012 05/31/21 0506  BP: (!) 117/58 (!) 118/51 (!) 116/58 (!) 105/53  Pulse: 79 85 79 68  Resp: 18 18 18 18   Temp: 98.7 F (37.1 C) 99.4 F (37.4 C) 98.3 F (36.8 C) 98.4 F (36.9 C)  TempSrc: Oral Oral Oral Oral  SpO2:  93% 95% 96%  Weight:      Height:        Intake/Output Summary (Last 24 hours) at 05/31/2021 0810 Last data filed at 05/31/2021 0600 Gross per 24 hour  Intake 1300 ml  Output 775 ml  Net 525 ml   Filed Weights   05/30/21 0120 05/30/21 0700  Weight: 59.1 kg 59.2 kg    Examination:  General exam: Appears calm and comfortable  Respiratory system: Clear to auscultation. Respiratory effort normal. Cardiovascular system: S1 & S2 heard, RRR.  Gastrointestinal system: Abdomen is nondistended, soft and nontender. No organomegaly or masses felt. Normal bowel sounds heard. Central nervous system: Alert and oriented.  Extremities: Symmetric 5 x 5 power.   Data Reviewed: I have personally reviewed following labs and imaging studies  CBC: Recent Labs  Lab 05/29/21 1842 05/30/21 0417  WBC 7.7 9.7  HGB 13.7 13.4  HCT 41.4 41.0  MCV 95.6 95.1  PLT 122* 774*   Basic Metabolic Panel: Recent Labs  Lab 05/29/21 1842 05/30/21 0417 05/30/21 2133 05/31/21 0238  NA 138 138 135 133*  K 3.9 3.6 3.9 4.0  CL 103 106 98 105  CO2 24 22 20* 21*  GLUCOSE 211* 134* 417*  262*  BUN 14 16 23  29*  CREATININE 0.80 0.83 1.13* 0.87  CALCIUM 9.9 9.1 8.7* 8.7*   GFR: Estimated Creatinine Clearance: 46.9 mL/min (by C-G formula based on SCr of 0.87 mg/dL). Liver Function Tests: Recent Labs  Lab 05/29/21 1842 05/30/21 0417  AST 90* 63*  ALT 51* 44  ALKPHOS 112 94  BILITOT 2.1* 2.3*  PROT 7.0 6.6  ALBUMIN 3.0* 2.7*   Recent Labs  Lab 05/29/21 1842  LIPASE 28   No results for input(s): AMMONIA in the  last 168 hours. Coagulation Profile: Recent Labs  Lab 05/29/21 2301  INR 1.1   Cardiac Enzymes: No results for input(s): CKTOTAL, CKMB, CKMBINDEX, TROPONINI in the last 168 hours. BNP (last 3 results) No results for input(s): PROBNP in the last 8760 hours. HbA1C: No results for input(s): HGBA1C in the last 72 hours. CBG: Recent Labs  Lab 05/30/21 1619 05/30/21 2051 05/31/21 0005 05/31/21 0159 05/31/21 0633  GLUCAP 296* 470* 341* 259* 197*   Lipid Profile: No results for input(s): CHOL, HDL, LDLCALC, TRIG, CHOLHDL, LDLDIRECT in the last 72 hours. Thyroid Function Tests: No results for input(s): TSH, T4TOTAL, FREET4, T3FREE, THYROIDAB in the last 72 hours. Anemia Panel: No results for input(s): VITAMINB12, FOLATE, FERRITIN, TIBC, IRON, RETICCTPCT in the last 72 hours. Sepsis Labs: No results for input(s): PROCALCITON, LATICACIDVEN in the last 168 hours.  Recent Results (from the past 240 hour(s))  Urine Culture     Status: Abnormal (Preliminary result)   Collection Time: 05/29/21  8:33 PM   Specimen: Urine, Clean Catch  Result Value Ref Range Status   Specimen Description URINE, CLEAN CATCH  Final   Special Requests   Final    NONE Performed at [facility], [place] [address], [place], [place] [address]    Culture >=100,000 COLONIES/mL GRAM NEGATIVE RODS (A)  Final   Report Status PENDING  Incomplete  Resp Panel by RT-PCR (Flu A&B, Covid) Nasopharyngeal Swab     Status: None   Collection Time: 05/29/21 10:50 PM   Specimen: Nasopharyngeal Swab; Nasopharyngeal(NP) swabs in vial transport medium  Result Value Ref Range Status   SARS Coronavirus 2 by RT PCR NEGATIVE NEGATIVE Final    Comment: (NOTE) SARS-CoV-2 target nucleic acids are NOT DETECTED.  The SARS-CoV-2 RNA is generally detectable in upper respiratory specimens during the acute phase of infection. The lowest concentration of SARS-CoV-2 viral copies this assay can detect is 138 copies/mL. A negative  result does not preclude SARS-Cov-2 infection and should not be used as the sole basis for treatment or other patient management decisions. A negative result may occur with  improper specimen collection/handling, submission of specimen other than nasopharyngeal swab, presence of viral mutation(s) within the areas targeted by this assay, and inadequate number of viral copies(<138 copies/mL). A negative result must be combined with clinical observations, patient history, and epidemiological information. The expected result is Negative.  Fact Sheet for Patients:  EntrepreneurPulse.com.au  Fact Sheet for Healthcare Providers:  IncredibleEmployment.be  This test is no t yet approved or cleared by the [place] FDA and  has been authorized for detection and/or diagnosis of SARS-CoV-2 by FDA under an Emergency Use Authorization (EUA). This EUA will remain  in effect (meaning this test can be used) for the duration of the COVID-19 declaration under Section 564(b)(1) of the Act, 21 U.S.C.section 360bbb-3(b)(1), unless the authorization is terminated  or revoked sooner.       Influenza A by PCR NEGATIVE NEGATIVE Final  Influenza B by PCR NEGATIVE NEGATIVE Final    Comment: (NOTE) The Xpert Xpress SARS-CoV-2/FLU/RSV plus assay is intended as an aid in the diagnosis of influenza from Nasopharyngeal swab specimens and should not be used as a sole basis for treatment. Nasal washings and aspirates are unacceptable for Xpert Xpress SARS-CoV-2/FLU/RSV testing.  Fact Sheet for Patients: EntrepreneurPulse.com.au  Fact Sheet for Healthcare Providers: IncredibleEmployment.be  This test is not yet approved or cleared by the [place] FDA and has been authorized for detection and/or diagnosis of SARS-CoV-2 by FDA under an Emergency Use Authorization (EUA). This EUA will remain in effect (meaning this test can be used)  for the duration of the COVID-19 declaration under Section 564(b)(1) of the Act, 21 U.S.C. section 360bbb-3(b)(1), unless the authorization is terminated or revoked.  Performed at [facility], [place] [address], [place], [place] [address]   Surgical PCR screen     Status: None   Collection Time: 05/30/21  2:56 AM   Specimen: Nasal Mucosa; Nasal Swab  Result Value Ref Range Status   MRSA, PCR NEGATIVE NEGATIVE Final   Staphylococcus aureus NEGATIVE NEGATIVE Final    Comment: (NOTE) The Xpert SA Assay (FDA approved for NASAL specimens in patients 29 years of age and older), is one component of a comprehensive surveillance program. It is not intended to diagnose infection nor to guide or monitor treatment. Performed at [facility], [place] [address], [place], [place] [address]   Urine Culture     Status: Abnormal   Collection Time: 05/30/21  9:15 AM   Specimen: Urine, Cystoscope  Result Value Ref Range Status   Specimen Description URINE, RANDOM  Final   Special Requests   Final    CYSTOSCOPY Performed at [facility], [place] [address] [place], [place] [address]    Culture MULTIPLE SPECIES PRESENT, SUGGEST RECOLLECTION (A)  Final   Report Status 05/31/2021 FINAL  Final         Radiology Studies: CT Abdomen Pelvis W Contrast  Result Date: 05/29/2021 CLINICAL DATA:  Left side abdominal pain. Flank pain, kidney stone suspected EXAM: CT ABDOMEN AND PELVIS WITH CONTRAST TECHNIQUE: Multidetector CT imaging of the abdomen and pelvis was performed using the standard protocol following bolus administration of intravenous contrast. RADIATION DOSE REDUCTION: This exam was performed according to the departmental dose-optimization program which includes automated exposure control, adjustment of the mA and/or kV according to patient size and/or use of iterative reconstruction technique. CONTRAST:  146m OMNIPAQUE IOHEXOL 300 MG/ML  SOLN COMPARISON:  04/06/2019 FINDINGS: Lower  chest: No acute abnormality. Hepatobiliary: TIPS shunt noted within the liver. Changes of cirrhosis. No focal hepatic abnormality. Gallbladder grossly unremarkable. Pancreas: No focal abnormality or ductal dilatation. Spleen: Splenomegaly with a craniocaudal length of 13.7  cm. Adrenals/Urinary Tract: Left hydronephrosis due to 4 mm proximal left ureteral stone. No stones or hydronephrosis on the right. Adrenal glands and urinary bladder unremarkable. Stomach/Bowel: Stomach, large and small bowel grossly unremarkable. Vascular/Lymphatic: No evidence of aneurysm or adenopathy. Reproductive: Prior hysterectomy.  No adnexal masses. Other: Trace free fluid in the pelvis.  No free air. Musculoskeletal: No acute bony abnormality. IMPRESSION: 4 mm proximal left ureteral stone with mild left hydronephrosis. Cirrhosis with associated splenomegaly.  Prior TIPS. Electronically Signed   By: KRolm BaptiseM.D.   On: 05/29/2021 22:12   DG Retrograde Pyelogram  Result Date: 05/30/2021 CLINICAL DATA:  Ureteral stone. EXAM: RETROGRADE PYELOGRAM COMPARISON:  CT AP, 05/29/2021. FLUOROSCOPY TIME:  Fluoroscopy  Time:  14.8 seconds Radiation Exposure Index (if provided by the fluoroscopic device): 2.5 mGy Number of Acquired Spot Images: 4 static images FINDINGS: Multiple, limited oblique planar images of the LEFT abdomen and pelvis obtained C-arm. Images demonstrating LEFT ureteral cannulation, retrograde pyelogram and LEFT nephroureteral stent placement. Moderate LEFT hydronephrosis and a proximal ureteral filling defect is demonstrated. See key image. IMPRESSION: 1. LEFT retrograde pyelogram and nephroureteral stent placement. 2. Moderate LEFT hydronephrosis with proximal ureteral filling defect is demonstrated. For complete description of intra procedural findings, please see performing service dictation. Electronically Signed   By: Michaelle Birks M.D.   On: 05/30/2021 10:03   DG C-Arm 1-60 Min-No Report  Result Date:  05/30/2021 Fluoroscopy was utilized by the requesting physician.  No radiographic interpretation.        Scheduled Meds:  ezetimibe  10 mg Oral Daily   Gerhardt's butt cream   Topical TID   insulin aspart  0-15 Units Subcutaneous TID WC   insulin aspart  0-5 Units Subcutaneous QHS   insulin detemir  15 Units Subcutaneous QHS   lactulose  20 g Oral Daily   multivitamin with minerals  1 tablet Oral Daily   pantoprazole  40 mg Oral Daily   polyvinyl alcohol   Both Eyes QHS   rifaximin  550 mg Oral BID   Continuous Infusions:  cefTRIAXone (ROCEPHIN)  IV 1 g (05/30/21 2107)     LOS: 2 days    Time spent: 35 minutes.     Elmarie Shiley, MD [facility]   If 7PM-7AM, please contact night-coverage [email or web address]  05/31/2021, 8:10 AM

## 2021-05-31 NOTE — Evaluation (Signed)
Physical Therapy Evaluation Patient Details Name: [name] MRN: [id number] DOB: Sep 18, 1945 Today's Date: 05/31/2021  History of Present Illness  Pt is 76 yo female admitted on 05/29/21 with  left ureteral stone with mild left hydronephrosis and is s/p cystoscopy with stent on 05/30/21. Pt with hx including cirrhosis, DM2, HTN, hepatic encephalopathy, esophageal varices s/p banding and TIPS.  Clinical Impression  Pt admitted with above dx.  At baseline, she lives with spouse and is independent without AD.  Pt has been ambulating to bathroom independently.  During therapy eval, pt demonstrated safe and independent transfers and gait (400').  She also performed 2 steps with supervision to simulate home environment.  Pt did report fatigue but otherwise tolerated well.  No further skilled PT needs.        Recommendations for follow up therapy are one component of a multi-disciplinary discharge planning process, led by the attending physician.  Recommendations may be updated based on patient status, additional functional criteria and insurance authorization.  Follow Up Recommendations No PT follow up    Assistance Recommended at Discharge PRN  Patient can return home with the following       Equipment Recommendations None recommended by PT  Recommendations for Other Services       Functional Status Assessment Patient has not had a recent decline in their functional status     Precautions / Restrictions Precautions Precautions: None      Mobility  Bed Mobility   Bed Mobility: Supine to Sit, Sit to Supine     Supine to sit: Independent Sit to supine: Independent   General bed mobility comments: Supervision during therapy but has been doing independently    Transfers Overall transfer level: Independent Equipment used: None Transfers: Sit to/from Stand Sit to Stand: Independent           General transfer comment: Supervision during therapy but has been doing  independently    Ambulation/Gait Ambulation/Gait assistance: Supervision Gait Distance (Feet): 400 Feet Assistive device: None Gait Pattern/deviations: Step-through pattern       General Gait Details: Steady gait without LOB; near normal speed.  Pt has been ambulating in room independently  Stairs Stairs: Yes Stairs assistance: Supervision Stair Management: One rail Right, Alternating pattern, Forwards Number of Stairs: 2 General stair comments: close supervision  Wheelchair Mobility    Modified Rankin (Stroke Patients Only)       Balance Overall balance assessment: Independent Sitting-balance support: No upper extremity supported Sitting balance-Leahy Scale: Normal     Standing balance support: No upper extremity supported Standing balance-Leahy Scale: Good                               Pertinent Vitals/Pain Pain Assessment Pain Assessment: No/denies pain    Home Living Family/patient expects to be discharged to:: Private residence Living Arrangements: Spouse/significant other Available Help at Discharge: Family;Available 24 hours/day Type of Home: House Home Access: Stairs to enter Entrance Stairs-Rails: Psychiatric nurse of Steps: 4   Home Layout: One level Home Equipment: Shower seat;Grab bars - tub/shower;Rolling Environmental consultant (2 wheels)      Prior Function Prior Level of Function : Independent/Modified Independent;Driving             Mobility Comments: Communiity ambulator no AD ADLs Comments: Does ADLs and IADLs     Hand Dominance        Extremity/Trunk Assessment   Upper Extremity Assessment Upper Extremity Assessment: Overall  WFL for tasks assessed    Lower Extremity Assessment Lower Extremity Assessment: Overall WFL for tasks assessed (ROM WFL; MMT 5/5)    Cervical / Trunk Assessment Cervical / Trunk Assessment: Normal  Communication   Communication: HOH  Cognition Arousal/Alertness:  Awake/alert Behavior During Therapy: WFL for tasks assessed/performed Overall Cognitive Status: Within Functional Limits for tasks assessed                                          General Comments      Exercises     Assessment/Plan    PT Assessment Patient does not need any further PT services  PT Problem List         PT Treatment Interventions      PT Goals (Current goals can be found in the Care Plan section)  Acute Rehab PT Goals Patient Stated Goal: return home PT Goal Formulation: All assessment and education complete, DC therapy    Frequency       Co-evaluation               AM-PAC PT "6 Clicks" Mobility  Outcome Measure Help needed turning from your back to your side while in a flat bed without using bedrails?: None Help needed moving from lying on your back to sitting on the side of a flat bed without using bedrails?: None Help needed moving to and from a bed to a chair (including a wheelchair)?: None Help needed standing up from a chair using your arms (e.g., wheelchair or bedside chair)?: None Help needed to walk in hospital room?: None Help needed climbing 3-5 steps with a railing? : A Little 6 Click Score: 23    End of Session   Activity Tolerance: Patient tolerated treatment well Patient left: in bed;with call bell/phone within reach Nurse Communication: Mobility status PT Visit Diagnosis: Other abnormalities of gait and mobility (R26.89)    Time: 1779-3903 PT Time Calculation (min) (ACUTE ONLY): 17 min   Charges:   PT Evaluation $PT Eval Low Complexity: 1 Low          Lazarius Rivkin, PT Acute Rehab Services Pager [phone] [facility] [phone]   Karlton Lemon 05/31/2021, 4:59 PM

## 2021-05-31 NOTE — Plan of Care (Signed)

## 2021-06-01 DIAGNOSIS — N136 Pyonephrosis: Secondary | ICD-10-CM | POA: Diagnosis not present

## 2021-06-01 LAB — GLUCOSE, CAPILLARY
Glucose-Capillary: 61 mg/dL — ABNORMAL LOW (ref 70–99)
Glucose-Capillary: 142 mg/dL — ABNORMAL HIGH (ref 70–99)

## 2021-06-01 LAB — CBC
HCT: 35.4 % — ABNORMAL LOW (ref 36.0–46.0)
Hemoglobin: 11.7 g/dL — ABNORMAL LOW (ref 12.0–15.0)
MCH: 30.9 pg (ref 26.0–34.0)
MCHC: 33.1 g/dL (ref 30.0–36.0)
MCV: 93.4 fL (ref 80.0–100.0)
Platelets: 93 10*3/uL — ABNORMAL LOW (ref 150–400)
RBC: 3.79 MIL/uL — ABNORMAL LOW (ref 3.87–5.11)
RDW: 14.2 % (ref 11.5–15.5)
WBC: 6.3 10*3/uL (ref 4.0–10.5)
nRBC: 0 % (ref 0.0–0.2)

## 2021-06-01 LAB — BASIC METABOLIC PANEL
Anion gap: 5 (ref 5–15)
BUN: 20 mg/dL (ref 8–23)
CO2: 24 mmol/L (ref 22–32)
Calcium: 8.3 mg/dL — ABNORMAL LOW (ref 8.9–10.3)
Chloride: 110 mmol/L (ref 98–111)
Creatinine, Ser: 0.81 mg/dL (ref 0.44–1.00)
GFR, Estimated: >60 mL/min (ref >60)
Glucose, Bld: 105 mg/dL — ABNORMAL HIGH (ref 70–99)
Potassium: 3.6 mmol/L (ref 3.5–5.1)
Sodium: 139 mmol/L (ref 135–145)

## 2021-06-01 MED ORDER — LACTULOSE 10 GM/15ML PO SOLN: 20.0000 g | Freq: Every day | ORAL | 0 refills | Status: DC

## 2021-06-01 MED ORDER — CEPHALEXIN 500 MG PO CAPS
500.0000 mg | ORAL_CAPSULE | Freq: Three times a day (TID) | ORAL | 0 refills | Status: DC
Start: 2021-06-01 — End: 2021-06-01

## 2021-06-01 MED ORDER — LEVEMIR FLEXTOUCH 100 UNIT/ML ~~LOC~~ SOPN: 23.0000 [IU] | PEN_INJECTOR | Freq: Every day | SUBCUTANEOUS | 3 refills | Status: DC

## 2021-06-01 MED ORDER — CEPHALEXIN 500 MG PO CAPS: 500.0000 mg | ORAL_CAPSULE | Freq: Three times a day (TID) | ORAL | 0 refills | Status: DC

## 2021-06-01 NOTE — TOC Transition Note (Signed)
Transition of Care [facility]) - CM/SW Discharge Note   Patient Details  Name: [name] MRN: [id number] Date of Birth: [birth year]  [facility] CM/SW Contact:  Tom-Johnson, Renea Ee, RN Phone Number: 06/01/2021, 12:16 PM   Clinical Narrative:    Patient is scheduled for discharge today. From home with husband. Admitted for left ureteral stone with mild left hydronephrosis. Has six supportive children. Has a cane, walker and shower chair at home. PCP is [name], MD and uses [facility]. No PT followup and no recommendations noted. No further TOC needs noted. Family to transport at discharge.   Final next level of care: Home/Self Care Barriers to Discharge: Barriers Resolved   Patient Goals and CMS Choice Patient states their goals for this hospitalization and ongoing recovery are:: To return hone CMS Medicare.gov Compare Post Acute Care list provided to:: Patient Choice offered to / list presented to : NA  Discharge Placement                       Discharge Plan and Services                DME Arranged: N/A DME Agency: NA         HH Agency: NA        Social Determinants of Health (SDOH) Interventions     Readmission Risk Interventions Readmission Risk Prevention Plan 04/12/2019  Transportation Screening Complete  PCP or Specialist Appt within 3-5 Days Complete  HRI or [place] Complete  Social Work Consult for [place] Planning/Counseling Complete  Palliative Care Screening Not Applicable  Medication Review Press photographer) Complete  Some recent data might be hidden

## 2021-06-01 NOTE — Discharge Summary (Signed)
Physician Discharge Summary  [name] GLO:[id number] DOB: 1945-07-08 DOA: 05/29/2021  PCP: [name], MD  Admit date: 05/29/2021 Discharge date: 06/01/2021  Admitted From: Home Disposition:  Home   Recommendations for Outpatient Follow-up:  Follow up with PCP in 1-2 weeks Please obtain BMP/CBC in one week Needs to follow up with urologist for further treatment of stone and stent removal.    Discharge Condition: Stable.  CODE STATUS: Stable  Diet recommendation: Heart Healthy  Brief/Interim Summary: 76 year old past medical history significant for cirrhosis, diabetes type 2, hypertension, history of hepatic encephalopathy on lactulose and rifaximin, history of esophageal varices bleeding status post banding and TIPS, presents to the ED complaining of left-sided abdominal pain and back pain.  Reported nausea.  CT abdomen and pelvis showed 4 mm proximal left ureteral stone with mild left hydronephrosis.     1-Pyohydronephrosis// UTI, left ureteral calculus associated with mild hydronephrosis -Admitted for IV fluids and IV antibiotics. -Cystoscopy with stent placement 1/22 - stable, plan to discharge on Keflex for 7 days.  -Urine culture 1/21 grew: multiples bacterial morphotype.    Hypertension: Plan to resume Lasix at discharge   Cirrhosis of the liver: Continue with lactulose and rifaximin Mild transaminases in the setting of acute infection   Diabetes type 2, hyperglycemia uncontrolled Continue with Levemir, increased dose and a sliding scale insulin Resume home regimen.    History of Paroxysmal AF: Not on anticoagulation due to cirrhosis and history of esophageal bleed Mild hyponatremia: Might be related to hyperglycemia.   Mild thrombocytopenia: Setting of cirrhosis   Estimated body mass index is 23.12 kg/m as calculated from the following:   Height as of this encounter: 5' 3"  (1.6 m).   Weight as of this encounter: 59.2 kg.       Discharge  Diagnoses:  Principal Problem:   Pyohydronephrosis Active Problems:   Essential hypertension   DM2 (diabetes mellitus, type 2) (HCC)   Cirrhosis of liver ([place])   Acute lower UTI    Discharge Instructions  Discharge Instructions     Diet - low sodium heart healthy   Complete by: As directed    Increase activity slowly   Complete by: As directed    No wound care   Complete by: As directed       Allergies as of 06/01/2021       Reactions   Kiwi Extract Anaphylaxis   Tdap [tetanus-diphth-acell Pertussis] Swelling, Other (See Comments)   Swelling at injection site, gets very hot   Statins Other (See Comments)   RHABDOMYOLYSIS   Latex Itching, Dermatitis, Rash   Tramadol Nausea And Vomiting        Medication List     TAKE these medications    acetaminophen 500 MG tablet Commonly known as: TYLENOL Take 1,000 mg by mouth at bedtime.   BD Pen Needle Nano U/F 32G X 4 MM Misc Generic drug: Insulin Pen Needle USE THREE TIMES DAILY AS DIRECTED   benzonatate 100 MG capsule Commonly known as: TESSALON Take 1 capsule (100 mg total) by mouth every 8 (eight) hours as needed for cough.   Biotin 10000 MCG Tabs Take 10,000 mcg by mouth daily.   CALCIUM 600 + D PO Take 1 tablet by mouth in the morning and at bedtime.   cephALEXin 500 MG capsule Commonly known as: KEFLEX Take 1 capsule (500 mg total) by mouth 3 (three) times daily for 7 days.   empagliflozin 25 MG Tabs tablet Commonly known as: [place]  Take 1 tablet (25 mg total) by mouth daily.   ezetimibe 10 MG tablet Commonly known as: ZETIA Take 1 tablet (10 mg total) by mouth daily.   FISH OIL PO Take 1,000 mg by mouth daily.   fluticasone 50 MCG/ACT nasal spray Commonly known as: FLONASE Place 1 spray into both nostrils daily for 3 days.   furosemide 20 MG tablet Commonly known as: LASIX TAKE 1 TABLET(20 MG) BY MOUTH DAILY What changed: See the new instructions.   glucose blood test strip One  Touch Ultra strips. Use to test blood sugar three times daily. Dx: E11.65 What changed:  when to take this additional instructions   INSULIN SYRINGE 1CC/31GX5/16" 31G X 5/16" 1 ML Misc USE AS DIRECTED TO INJECT LEVIMIR   lactulose 10 GM/15ML solution Commonly known as: CHRONULAC Take 30 mLs (20 g total) by mouth daily.   Levemir FlexTouch 100 UNIT/ML FlexPen Generic drug: insulin detemir Inject 23 Units into the skin at bedtime. What changed: See the new instructions.   MAGNESIUM PO Take 500 mg by mouth daily.   metoprolol tartrate 25 MG tablet Commonly known as: LOPRESSOR Take 1 tablet (25 mg total) by mouth as needed (use as needed for HR greater than 110).   multivitamin with minerals tablet Take 1 tablet by mouth daily.   pantoprazole 40 MG tablet Commonly known as: PROTONIX TAKE 1 TABLET(40 MG) BY MOUTH DAILY What changed:  how much to take how to take this when to take this additional instructions   PROBIOTIC DAILY PO Take 1 capsule by mouth daily. Digestive Advantage Probiotic   SYSTANE OP Place 1 drop into both eyes at bedtime.   VITAMIN B 12 PO Take 1,000 mcg by mouth daily.   VITAMIN D-3 PO Take 1,000 Units by mouth daily.   Xifaxan 550 MG Tabs tablet Generic drug: rifaximin Take 1 tablet (550 mg total) by mouth 2 (two) times daily.        Follow-up Information     Janith Lima, MD. Call.   Specialty: Urology Why: My office will be in touch to schedule an appointment in the next 1 to 2 weeks to plan your next surgery Contact information: [place] [phone]                Allergies  Allergen Reactions   Kiwi Extract Anaphylaxis   Tdap [Tetanus-Diphth-Acell Pertussis] Swelling and Other (See Comments)    Swelling at injection site, gets very hot   Statins Other (See Comments)    RHABDOMYOLYSIS   Latex Itching, Dermatitis and Rash   Tramadol Nausea And Vomiting    Consultations: Urology     Procedures/Studies: CT Abdomen Pelvis W Contrast  Result Date: 05/29/2021 CLINICAL DATA:  Left side abdominal pain. Flank pain, kidney stone suspected EXAM: CT ABDOMEN AND PELVIS WITH CONTRAST TECHNIQUE: Multidetector CT imaging of the abdomen and pelvis was performed using the standard protocol following bolus administration of intravenous contrast. RADIATION DOSE REDUCTION: This exam was performed according to the departmental dose-optimization program which includes automated exposure control, adjustment of the mA and/or kV according to patient size and/or use of iterative reconstruction technique. CONTRAST:  191m OMNIPAQUE IOHEXOL 300 MG/ML  SOLN COMPARISON:  04/06/2019 FINDINGS: Lower chest: No acute abnormality. Hepatobiliary: TIPS shunt noted within the liver. Changes of cirrhosis. No focal hepatic abnormality. Gallbladder grossly unremarkable. Pancreas: No focal abnormality or ductal dilatation. Spleen: Splenomegaly with a craniocaudal length of 13.7  cm. Adrenals/Urinary Tract: Left  hydronephrosis due to 4 mm proximal left ureteral stone. No stones or hydronephrosis on the right. Adrenal glands and urinary bladder unremarkable. Stomach/Bowel: Stomach, large and small bowel grossly unremarkable. Vascular/Lymphatic: No evidence of aneurysm or adenopathy. Reproductive: Prior hysterectomy.  No adnexal masses. Other: Trace free fluid in the pelvis.  No free air. Musculoskeletal: No acute bony abnormality. IMPRESSION: 4 mm proximal left ureteral stone with mild left hydronephrosis. Cirrhosis with associated splenomegaly.  Prior TIPS. Electronically Signed   By: Rolm Baptise M.D.   On: 05/29/2021 22:12   DG Retrograde Pyelogram  Result Date: 05/30/2021 CLINICAL DATA:  Ureteral stone. EXAM: RETROGRADE PYELOGRAM COMPARISON:  CT AP, 05/29/2021. FLUOROSCOPY TIME:  Fluoroscopy Time:  14.8 seconds Radiation Exposure Index (if provided by the fluoroscopic device): 2.5 mGy Number of Acquired Spot Images: 4  static images FINDINGS: Multiple, limited oblique planar images of the LEFT abdomen and pelvis obtained C-arm. Images demonstrating LEFT ureteral cannulation, retrograde pyelogram and LEFT nephroureteral stent placement. Moderate LEFT hydronephrosis and a proximal ureteral filling defect is demonstrated. See key image. IMPRESSION: 1. LEFT retrograde pyelogram and nephroureteral stent placement. 2. Moderate LEFT hydronephrosis with proximal ureteral filling defect is demonstrated. For complete description of intra procedural findings, please see performing service dictation. Electronically Signed   By: Michaelle Birks M.D.   On: 05/30/2021 10:03   DG C-Arm 1-60 Min-No Report  Result Date: 05/30/2021 Fluoroscopy was utilized by the requesting physician.  No radiographic interpretation.     Subjective: She is feeling well, denies pain. Tolerating diet.   Discharge Exam: Vitals:   05/31/21 2109 06/01/21 0450  BP: (!) 113/51 (!) 114/53  Pulse: 73 70  Resp: 18 18  Temp: 98 F (36.7 C) 98.5 F (36.9 C)  SpO2: 97% 98%     General: Pt is alert, awake, not in acute distress Cardiovascular: RRR, S1/S2 +, no rubs, no gallops Respiratory: CTA bilaterally, no wheezing, no rhonchi Abdominal: Soft, NT, ND, bowel sounds + Extremities: no edema, no cyanosis    The results of significant diagnostics from this hospitalization (including imaging, microbiology, ancillary and laboratory) are listed below for reference.     Microbiology: Recent Results (from the past 240 hour(s))  Urine Culture     Status: Abnormal (Preliminary result)   Collection Time: 05/29/21  8:33 PM   Specimen: Urine, Clean Catch  Result Value Ref Range Status   Specimen Description URINE, CLEAN CATCH  Final   Special Requests   Final    NONE Performed at [facility], [place] [address], [place], [place] [address]    Culture (A)  Final    >=100,000 COLONIES/mL CITROBACTER FARMERI SUSCEPTIBILITIES TO FOLLOW  REPEATING >=100,000 COLONIES/mL STREPTOCOCCUS PARASANGUINIS    Report Status PENDING  Incomplete  Resp Panel by RT-PCR (Flu A&B, Covid) Nasopharyngeal Swab     Status: None   Collection Time: 05/29/21 10:50 PM   Specimen: Nasopharyngeal Swab; Nasopharyngeal(NP) swabs in vial transport medium  Result Value Ref Range Status   SARS Coronavirus 2 by RT PCR NEGATIVE NEGATIVE Final    Comment: (NOTE) SARS-CoV-2 target nucleic acids are NOT DETECTED.  The SARS-CoV-2 RNA is generally detectable in upper respiratory specimens during the acute phase of infection. The lowest concentration of SARS-CoV-2 viral copies this assay can detect is 138 copies/mL. A negative result does not preclude SARS-Cov-2 infection and should not be used as the sole basis for treatment or other patient management decisions. A negative result may occur with  improper specimen collection/handling, submission  of specimen other than nasopharyngeal swab, presence of viral mutation(s) within the areas targeted by this assay, and inadequate number of viral copies(<138 copies/mL). A negative result must be combined with clinical observations, patient history, and epidemiological information. The expected result is Negative.  Fact Sheet for Patients:  EntrepreneurPulse.com.au  Fact Sheet for Healthcare Providers:  IncredibleEmployment.be  This test is no t yet approved or cleared by the [place] FDA and  has been authorized for detection and/or diagnosis of SARS-CoV-2 by FDA under an Emergency Use Authorization (EUA). This EUA will remain  in effect (meaning this test can be used) for the duration of the COVID-19 declaration under Section 564(b)(1) of the Act, 21 U.S.C.section 360bbb-3(b)(1), unless the authorization is terminated  or revoked sooner.       Influenza A by PCR NEGATIVE NEGATIVE Final   Influenza B by PCR NEGATIVE NEGATIVE Final    Comment: (NOTE) The Xpert  Xpress SARS-CoV-2/FLU/RSV plus assay is intended as an aid in the diagnosis of influenza from Nasopharyngeal swab specimens and should not be used as a sole basis for treatment. Nasal washings and aspirates are unacceptable for Xpert Xpress SARS-CoV-2/FLU/RSV testing.  Fact Sheet for Patients: EntrepreneurPulse.com.au  Fact Sheet for Healthcare Providers: IncredibleEmployment.be  This test is not yet approved or cleared by the [place] FDA and has been authorized for detection and/or diagnosis of SARS-CoV-2 by FDA under an Emergency Use Authorization (EUA). This EUA will remain in effect (meaning this test can be used) for the duration of the COVID-19 declaration under Section 564(b)(1) of the Act, 21 U.S.C. section 360bbb-3(b)(1), unless the authorization is terminated or revoked.  Performed at [facility], [place] [address] [place], [place] [address]   Surgical PCR screen     Status: None   Collection Time: 05/30/21  2:56 AM   Specimen: Nasal Mucosa; Nasal Swab  Result Value Ref Range Status   MRSA, PCR NEGATIVE NEGATIVE Final   Staphylococcus aureus NEGATIVE NEGATIVE Final    Comment: (NOTE) The Xpert SA Assay (FDA approved for NASAL specimens in patients 12 years of age and older), is one component of a comprehensive surveillance program. It is not intended to diagnose infection nor to guide or monitor treatment. Performed at [facility], [place] [address] [place], [place] [address]   Urine Culture     Status: Abnormal   Collection Time: 05/30/21  9:15 AM   Specimen: Urine, Cystoscope  Result Value Ref Range Status   Specimen Description URINE, RANDOM  Final   Special Requests   Final    CYSTOSCOPY Performed at [facility], [place] [address] [place], [place] [address]    Culture MULTIPLE SPECIES PRESENT, SUGGEST RECOLLECTION (A)  Final   Report Status 05/31/2021 FINAL  Final     Labs: BNP (last 3  results) No results for input(s): BNP in the last 8760 hours. Basic Metabolic Panel: Recent Labs  Lab 05/29/21 1842 05/30/21 0417 05/30/21 2133 05/31/21 0238 06/01/21 0453  NA 138 138 135 133* 139  K 3.9 3.6 3.9 4.0 3.6  CL 103 106 98 105 110  CO2 24 22 20* 21* 24  GLUCOSE 211* 134* 417* 262* 105*  BUN 14 16 23  29* 20  CREATININE 0.80 0.83 1.13* 0.87 0.81  CALCIUM 9.9 9.1 8.7* 8.7* 8.3*   Liver Function Tests: Recent Labs  Lab 05/29/21 1842 05/30/21 0417  AST 90* 63*  ALT 51* 44  ALKPHOS 112 94  BILITOT 2.1* 2.3*  PROT 7.0 6.6  ALBUMIN 3.0* 2.7*   Recent Labs  Lab 05/29/21 1842  LIPASE 28   No results for input(s): AMMONIA in the last 168 hours. CBC: Recent Labs  Lab 05/29/21 1842 05/30/21 0417 06/01/21 0453  WBC 7.7 9.7 6.3  HGB 13.7 13.4 11.7*  HCT 41.4 41.0 35.4*  MCV 95.6 95.1 93.4  PLT 122* 114* 93*   Cardiac Enzymes: No results for input(s): CKTOTAL, CKMB, CKMBINDEX, TROPONINI in the last 168 hours. BNP: Invalid input(s): POCBNP CBG: Recent Labs  Lab 05/31/21 1152 05/31/21 1618 05/31/21 2109 06/01/21 0653 06/01/21 0752  GLUCAP 205* 238* 286* 61* 142*   D-Dimer No results for input(s): DDIMER in the last 72 hours. Hgb A1c Recent Labs    05/30/21 0417  HGBA1C 9.6*   Lipid Profile No results for input(s): CHOL, HDL, LDLCALC, TRIG, CHOLHDL, LDLDIRECT in the last 72 hours. Thyroid function studies No results for input(s): TSH, T4TOTAL, T3FREE, THYROIDAB in the last 72 hours.  Invalid input(s): FREET3 Anemia work up No results for input(s): VITAMINB12, FOLATE, FERRITIN, TIBC, IRON, RETICCTPCT in the last 72 hours. Urinalysis    Component Value Date/Time   COLORURINE YELLOW 05/29/2021 1838   APPEARANCEUR CLOUDY (A) 05/29/2021 1838   LABSPEC 1.022 05/29/2021 1838   PHURINE 7.0 05/29/2021 1838   GLUCOSEU >=500 (A) 05/29/2021 1838   HGBUR SMALL (A) 05/29/2021 1838   BILIRUBINUR NEGATIVE 05/29/2021 1838   BILIRUBINUR negative  01/29/2021 1952   BILIRUBINUR Negative 08/26/2019 1024   KETONESUR NEGATIVE 05/29/2021 1838   PROTEINUR NEGATIVE 05/29/2021 1838   UROBILINOGEN 0.2 01/29/2021 1952   UROBILINOGEN 0.2 12/18/2011 1409   NITRITE NEGATIVE 05/29/2021 1838   LEUKOCYTESUR LARGE (A) 05/29/2021 1838   Sepsis Labs Invalid input(s): PROCALCITONIN,  WBC,  LACTICIDVEN Microbiology Recent Results (from the past 240 hour(s))  Urine Culture     Status: Abnormal (Preliminary result)   Collection Time: 05/29/21  8:33 PM   Specimen: Urine, Clean Catch  Result Value Ref Range Status   Specimen Description URINE, CLEAN CATCH  Final   Special Requests   Final    NONE Performed at [facility], [place] [address] [place], [place] [address]    Culture (A)  Final    >=100,000 COLONIES/mL CITROBACTER FARMERI SUSCEPTIBILITIES TO FOLLOW REPEATING >=100,000 COLONIES/mL STREPTOCOCCUS PARASANGUINIS    Report Status PENDING  Incomplete  Resp Panel by RT-PCR (Flu A&B, Covid) Nasopharyngeal Swab     Status: None   Collection Time: 05/29/21 10:50 PM   Specimen: Nasopharyngeal Swab; Nasopharyngeal(NP) swabs in vial transport medium  Result Value Ref Range Status   SARS Coronavirus 2 by RT PCR NEGATIVE NEGATIVE Final    Comment: (NOTE) SARS-CoV-2 target nucleic acids are NOT DETECTED.  The SARS-CoV-2 RNA is generally detectable in upper respiratory specimens during the acute phase of infection. The lowest concentration of SARS-CoV-2 viral copies this assay can detect is 138 copies/mL. A negative result does not preclude SARS-Cov-2 infection and should not be used as the sole basis for treatment or other patient management decisions. A negative result may occur with  improper specimen collection/handling, submission of specimen other than nasopharyngeal swab, presence of viral mutation(s) within the areas targeted by this assay, and inadequate number of viral copies(<138 copies/mL). A negative result must be combined  with clinical observations, patient history, and epidemiological information. The expected result is Negative.  Fact Sheet for Patients:  EntrepreneurPulse.com.au  Fact Sheet for Healthcare Providers:  IncredibleEmployment.be  This test is no t  yet approved or cleared by the [place] and  has been authorized for detection and/or diagnosis of SARS-CoV-2 by FDA under an Emergency Use Authorization (EUA). This EUA will remain  in effect (meaning this test can be used) for the duration of the COVID-19 declaration under Section 564(b)(1) of the Act, 21 U.S.C.section 360bbb-3(b)(1), unless the authorization is terminated  or revoked sooner.       Influenza A by PCR NEGATIVE NEGATIVE Final   Influenza B by PCR NEGATIVE NEGATIVE Final    Comment: (NOTE) The Xpert Xpress SARS-CoV-2/FLU/RSV plus assay is intended as an aid in the diagnosis of influenza from Nasopharyngeal swab specimens and should not be used as a sole basis for treatment. Nasal washings and aspirates are unacceptable for Xpert Xpress SARS-CoV-2/FLU/RSV testing.  Fact Sheet for Patients: EntrepreneurPulse.com.au  Fact Sheet for Healthcare Providers: IncredibleEmployment.be  This test is not yet approved or cleared by the [place] FDA and has been authorized for detection and/or diagnosis of SARS-CoV-2 by FDA under an Emergency Use Authorization (EUA). This EUA will remain in effect (meaning this test can be used) for the duration of the COVID-19 declaration under Section 564(b)(1) of the Act, 21 U.S.C. section 360bbb-3(b)(1), unless the authorization is terminated or revoked.  Performed at [facility], [place] [address] [place], [place] [address]   Surgical PCR screen     Status: None   Collection Time: 05/30/21  2:56 AM   Specimen: Nasal Mucosa; Nasal Swab  Result Value Ref Range Status   MRSA, PCR NEGATIVE NEGATIVE Final    Staphylococcus aureus NEGATIVE NEGATIVE Final    Comment: (NOTE) The Xpert SA Assay (FDA approved for NASAL specimens in patients 61 years of age and older), is one component of a comprehensive surveillance program. It is not intended to diagnose infection nor to guide or monitor treatment. Performed at [facility], [place] [address], [place], [place] [address]   Urine Culture     Status: Abnormal   Collection Time: 05/30/21  9:15 AM   Specimen: Urine, Cystoscope  Result Value Ref Range Status   Specimen Description URINE, RANDOM  Final   Special Requests   Final    CYSTOSCOPY Performed at [facility], [place] [address], [place], [place] [address]    Culture MULTIPLE SPECIES PRESENT, SUGGEST RECOLLECTION (A)  Final   Report Status 05/31/2021 FINAL  Final     Time coordinating discharge: 40 minutes  SIGNED:   Elmarie Shiley, MD  [facility]

## 2021-06-01 NOTE — Progress Notes (Signed)
DISCHARGE NOTE HOME Maxie Better Debruler to be discharged Home per MD order. Discussed prescriptions and follow up appointments with the patient. Prescriptions given to patient; medication list explained in detail. Patient verbalized understanding.  Skin clean, dry and intact without evidence of skin break down, no evidence of skin tears noted. IV catheter discontinued intact. Site without signs and symptoms of complications. Dressing and pressure applied. Pt denies pain at the site currently. No complaints noted.  Patient free of lines, drains, and wounds.   An After Visit Summary (AVS) was printed and given to the patient. Patient escorted via wheelchair, and discharged home via private auto.  Berneta Levins, RN

## 2021-06-01 NOTE — Progress Notes (Signed)
Patient CBG 61 at 6:50. Patient given juice, hypoglycemic protocol started.     7:20 patient requested that CBG be rechecked after breakfast.  Patient not experiencing any symptoms.

## 2021-06-02 ENCOUNTER — Telehealth: Payer: Self-pay | Admitting: *Deleted

## 2021-06-02 LAB — URINE CULTURE: Culture: >=100000 — AB

## 2021-06-02 NOTE — Telephone Encounter (Signed)
Transition Care Management Follow-up Telephone Call Date of discharge and from where: 06/01/2021 [place] How have you been since you were released from the hospital? Better still alittle weak.  Any questions or concerns? No  Items Reviewed: Did the pt receive and understand the discharge instructions provided? Yes  Medications obtained and verified? Yes  Other? No  Any new allergies since your discharge? No  Dietary orders reviewed? Yes Do you have support at home? Yes   Home Care and Equipment/Supplies: Were home health services ordered? no If so, what is the name of the agency? na  Has the agency set up a time to come to the patient's home? not applicable Were any new equipment or medical supplies ordered?  No What is the name of the medical supply agency? na Were you able to get the supplies/equipment? not applicable Do you have any questions related to the use of the equipment or supplies? No  Functional Questionnaire: (I = Independent and D = Dependent) ADLs: I  Bathing/Dressing- I  Meal Prep- I  Eating- I  Maintaining continence- I  Transferring/Ambulation- I with assistance Cane/Walker  Managing Meds- I  Follow up appointments reviewed:  PCP Hospital f/u appt confirmed? Yes  Scheduled to see Dinah on 06/08/2021 @ 11:30. [facility] f/u appt confirmed? Yes  Calling today to schedule Urologist Appointment to have stent removed.  Are transportation arrangements needed? No  If their condition worsens, is the pt aware to call PCP or go to the Emergency Dept.? Yes Was the patient provided with contact information for the PCP's office or ED? Yes Was to pt encouraged to call back with questions or concerns? Yes

## 2021-06-04 ENCOUNTER — Telehealth: Payer: Self-pay

## 2021-06-04 DIAGNOSIS — K7460 Unspecified cirrhosis of liver: Secondary | ICD-10-CM

## 2021-06-04 NOTE — Telephone Encounter (Signed)
-----   Message from Stevan Born, [place] sent at 12/16/2020  9:48 AM EDT ----- Regarding: 6 month Patient needs MRI liver (hepatoma screening) and CBC, CMET, PT/INR, AFP in 6 months (Feb 2023) per Dr [name] dx cirr

## 2021-06-04 NOTE — Telephone Encounter (Signed)
Patient advised of MRI liver at [facility] on 06-15-21 at 8:00am. Patient instructed to arrive at 7:30am and nothing to eat or drink 4 hours prior to test.  Patient advised that she will also need lab work and she will not need an appointment for labs.  Patient agreed to plan and verbalized understanding.  No further questions.

## 2021-06-07 ENCOUNTER — Encounter: Payer: Self-pay | Admitting: Family

## 2021-06-08 ENCOUNTER — Ambulatory Visit (INDEPENDENT_AMBULATORY_CARE_PROVIDER_SITE_OTHER): Payer: Medicare Other | Admitting: Family

## 2021-06-08 ENCOUNTER — Encounter: Payer: Self-pay | Admitting: Family

## 2021-06-08 ENCOUNTER — Other Ambulatory Visit: Payer: Self-pay

## 2021-06-08 VITALS — BP 100/60 | HR 71 | Temp 97.1°F | Resp 16 | Ht 63.0 in | Wt 125.8 lb

## 2021-06-08 DIAGNOSIS — D696 Thrombocytopenia, unspecified: Secondary | ICD-10-CM | POA: Diagnosis not present

## 2021-06-08 DIAGNOSIS — I480 Paroxysmal atrial fibrillation: Secondary | ICD-10-CM

## 2021-06-08 DIAGNOSIS — I10 Essential (primary) hypertension: Secondary | ICD-10-CM

## 2021-06-08 DIAGNOSIS — K7460 Unspecified cirrhosis of liver: Secondary | ICD-10-CM | POA: Diagnosis not present

## 2021-06-08 DIAGNOSIS — N132 Hydronephrosis with renal and ureteral calculous obstruction: Secondary | ICD-10-CM

## 2021-06-08 DIAGNOSIS — I5032 Chronic diastolic (congestive) heart failure: Secondary | ICD-10-CM

## 2021-06-08 DIAGNOSIS — E1121 Type 2 diabetes mellitus with diabetic nephropathy: Secondary | ICD-10-CM

## 2021-06-08 DIAGNOSIS — Z794 Long term (current) use of insulin: Secondary | ICD-10-CM | POA: Diagnosis not present

## 2021-06-08 DIAGNOSIS — E871 Hypo-osmolality and hyponatremia: Secondary | ICD-10-CM

## 2021-06-08 DIAGNOSIS — R188 Other ascites: Secondary | ICD-10-CM

## 2021-06-08 LAB — CBC WITH DIFFERENTIAL/PLATELET
Absolute Monocytes: 630 cells/uL (ref 200–950)
Basophils Absolute: 41 cells/uL (ref 0–200)
Basophils Relative: 0.9 %
Eosinophils Absolute: 101 cells/uL (ref 15–500)
Eosinophils Relative: 2.2 %
HCT: 41.5 % (ref 35.0–45.0)
Hemoglobin: 13.9 g/dL (ref 11.7–15.5)
Lymphs Abs: 920 cells/uL (ref 850–3900)
MCH: 31.7 pg (ref 27.0–33.0)
MCHC: 33.5 g/dL (ref 32.0–36.0)
MCV: 94.7 fL (ref 80.0–100.0)
MPV: 11.8 fL (ref 7.5–12.5)
Monocytes Relative: 13.7 %
Neutro Abs: 2907 cells/uL (ref 1500–7800)
Neutrophils Relative %: 63.2 %
Platelets: 109 10*3/uL — ABNORMAL LOW (ref 140–400)
RBC: 4.38 10*6/uL (ref 3.80–5.10)
RDW: 13.1 % (ref 11.0–15.0)
Total Lymphocyte: 20 %
WBC: 4.6 10*3/uL (ref 3.8–10.8)

## 2021-06-08 LAB — BASIC METABOLIC PANEL WITH GFR
BUN: 19 mg/dL (ref 7–25)
CO2: 30 mmol/L (ref 20–32)
Calcium: 10 mg/dL (ref 8.6–10.4)
Chloride: 101 mmol/L (ref 98–110)
Creat: 0.83 mg/dL (ref 0.60–1.00)
Glucose, Bld: 341 mg/dL — ABNORMAL HIGH (ref 65–99)
Potassium: 4.3 mmol/L (ref 3.5–5.3)
Sodium: 137 mmol/L (ref 135–146)
eGFR: 74 mL/min/{1.73_m2} (ref >=60)

## 2021-06-08 MED ORDER — LEVEMIR FLEXTOUCH 100 UNIT/ML ~~LOC~~ SOPN: 25.0000 [IU] | PEN_INJECTOR | Freq: Every day | SUBCUTANEOUS | 3 refills | Status: DC

## 2021-06-08 NOTE — Progress Notes (Signed)
Provider: Raheim Beutler FNP-C  [name], MD  Patient Care Team: [name], MD as PCP - General (Family Medicine) Josue Hector, MD as PCP - Cardiology (Cardiology) Berle Mull, MD as Consulting Physician (Family Medicine) Sharmon Revere as Physician Assistant (Cardiology) Calvert Cantor, MD as Consulting Physician (Ophthalmology) [name], MD as Attending Physician (Gastroenterology)  Extended Emergency Contact Information Primary Emergency Contact: [facility] Phone: [phone] Relation: Daughter Secondary Emergency Contact: Blase Mess Address: [address]          [place], [place] [address] Johnnette Litter of [place] Phone: [phone] Mobile Phone: [phone] Relation: Spouse  Code Status: Full Code  Goals of care: Advanced Directive information Advanced Directives 06/07/2021  Does Patient Have a Medical Advance Directive? No  Type of Advance Directive -  Does patient want to make changes to medical advance directive? No - Patient declined  Copy of [place] in Chart? -  Would patient like information on creating a medical advance directive? -     Chief Complaint  Patient presents with   Transitions Of Care    [facility] for UTI 05/29/2021-06/01/2021    HPI:  Pt is a 76 y.o. female seen today for an acute visit for transition of care post hospital admission from 05/29/2021 - 06/01/2021.Has a medical history of Hypertension,Type 2 Diabetes mellitus,Afib,Liver cirrhosis,CAD,CHF, Esophageal varices,GERD,thrombocytopenia  among others.  she  presented to ED with complains of left sided abdominal pain,Nausea  and back pain.CT scan of the abdomen showed a 4 mm proximal left ureteral stone with mild left hydronephrosis.she was admitted and treated with IV fluids and I.V antibiotics.she had a cystoscopy with stent placement on 05/30/2021.urine analysis showed yellow cloudy urine with glucose > 500,large  leukocytes,mucus,WBC present with many bacteria but was negative nitrites.final urine culture showed > 100,000 colonies of Citrobacter Farmeri and Streptococcus parasanguinis.Her condition stabilized and was discharged on 7 days course of Keflex. Has appointment already with Urologist.   Her glucose were high during admission levemir was increased and sliding scale insulin added but was discharged on her home regimen. States her CBG at home ranging in the 50's - 120's.CBG this morning was 63 had to drink juice.states on Levemir 30 units SQ at bedtime.On med list supposed to be on 23 units but states Dr.Miller advised her to increase to 35  unit on previous visit but just increased to 30 units. I have discussed with her that due to several low blood sugars recommend reducing from 30 unit to 25 units at bedtime.  Her platelets was 122 thought due to her liver cirrhosis.Her liver enzymes remained high thought due acute infection.she was maintained on lactulose and rifaximin.    Has had 4 lbs weight loss since last seen here on 05/12/2021.States appetite is good.suspect due to loss of fluid on Furosemide.   She denies any acute issues today.states feeling much better. Reports no blood in the urine.    Past Medical History:  Diagnosis Date   A-fib ([place])    08/19/19 afib with RVR, spontaneously converted to SR   Allergy    Arthritis    neck   Cataract    bilateral - MD monitoring cataracts   CHF (congestive heart failure) (HCC)    Chronic kidney disease, stage I    DR OTTELIN  HX UTIS   Cirrhosis ([place])    Coronary artery disease    s/p CABG 01/29/16: LIMA-LAD, SVG-RI, SVG-dRCA   COVID-19 virus infection 04/2020  Cramp of limb    Diabetes mellitus    Dysphagia, unspecified(787.20)    Dysuria    Epistaxis    GERD (gastroesophageal reflux disease)    Hearing loss    wears hearing aids   Heart murmur     DX FOR YEARS ASYMPTOMATIC   Hepatic encephalopathy    Lumbago    Myocardial infarction  ([place])    NSTEMI 01/27/16   Neoplasm of uncertain behavior of skin    Nonspecific elevation of levels of transaminase or lactic acid dehydrogenase (LDH)    Osteoarthrosis, unspecified whether generalized or localized, unspecified site    Other and unspecified hyperlipidemia    diet controlled   Pain in joint, shoulder region    Paresthesias 04/01/2015   Postablative ovarian failure    Trochanteric bursitis of left hip 12/15/2015   Type 2 diabetes mellitus without complication ([place])    Unspecified essential hypertension    no meds   Wears glasses    Past Surgical History:  Procedure Laterality Date   BACK SURGERY  06/2015   BREAST BIOPSY Left 12/18/2019   CARDIAC CATHETERIZATION N/A 01/27/2016   Procedure: Left Heart Cath and Coronary Angiography;  Surgeon: Belva Crome, MD;  Location: [facility];  Service: Cardiovascular;  Laterality: N/A;   COLONOSCOPY  2012   Dr Lajoyce Corners.    COLONOSCOPY WITH PROPOFOL N/A 07/07/2016   Procedure: COLONOSCOPY WITH PROPOFOL;  Surgeon: [name], MD;  Location: [facility];  Service: Endoscopy;  Laterality: N/A;   CORONARY ARTERY BYPASS GRAFT N/A 01/28/2016   Procedure: CORONARY ARTERY BYPASS GRAFTING (CABG) x 3 USING RIGHT LEG GREATER SAPHENOUS VEIN GRAFT;  Surgeon: Melrose Nakayama, MD;  Location: [facility];  Service: Open Heart Surgery;  Laterality: N/A;   CYSTOSCOPY WITH RETROGRADE PYELOGRAM, URETEROSCOPY AND STENT PLACEMENT Left 05/30/2021   Procedure: CYSTOSCOPY WITH RETROGRADE PYELOGRAM, URETEROSCOPY AND STENT PLACEMENT;  Surgeon: Ceasar Mons, MD;  Location: [facility];  Service: Urology;  Laterality: Left;   ENDOVEIN HARVEST OF GREATER SAPHENOUS VEIN Right 01/28/2016   Procedure: ENDOVEIN HARVEST OF GREATER SAPHENOUS VEIN;  Surgeon: Melrose Nakayama, MD;  Location: [facility];  Service: Open Heart Surgery;  Laterality: Right;   ESOPHAGEAL BANDING  03/28/2019   Procedure: ESOPHAGEAL BANDING;  Surgeon: [name], MD;   [facility];  Service: Endoscopy;;   ESOPHAGEAL BANDING  04/07/2019   Procedure: ESOPHAGEAL BANDING;  Surgeon: Juanita Craver, MD;  Location[facility];  Service: Endoscopy;;   ESOPHAGOGASTRODUODENOSCOPY N/A 04/07/2019   Procedure: ESOPHAGOGASTRODUODENOSCOPY (EGD);  Surgeon: Juanita Craver, MD;  Location: [facility] ENDOSCOPY;  Service: Endoscopy;  Laterality: N/A;   ESOPHAGOGASTRODUODENOSCOPY (EGD) WITH PROPOFOL N/A 07/07/2016   Procedure: ESOPHAGOGASTRODUODENOSCOPY (EGD) WITH PROPOFOL;  Surgeon: [name], MD;  [facility];  Service: Endoscopy;  Laterality: N/A;   ESOPHAGOGASTRODUODENOSCOPY (EGD) WITH PROPOFOL N/A 03/28/2019   Procedure: ESOPHAGOGASTRODUODENOSCOPY (EGD) WITH PROPOFOL;  Surgeon: [name], MD;  [facility];  Service: Endoscopy;  Laterality: N/A;   HEMOSTASIS CLIP PLACEMENT  04/07/2019   Procedure: HEMOSTASIS CLIP PLACEMENT;  Surgeon: Juanita Craver, MD;  Location: [facility];  Service: Endoscopy;;   IR ANGIOGRAM SELECTIVE EACH ADDITIONAL VESSEL  04/08/2019   IR EMBO ART  VEN HEMORR LYMPH EXTRAV  INC GUIDE ROADMAPPING  04/08/2019   IR PARACENTESIS  04/08/2019   IR RADIOLOGIST EVAL & MGMT  06/13/2019   IR RADIOLOGIST EVAL & MGMT  09/25/2019   IR RADIOLOGIST EVAL & MGMT  07/07/2020  IR TIPS  04/08/2019   MAXIMUM ACCESS (MAS)POSTERIOR LUMBAR INTERBODY FUSION (PLIF) 1 LEVEL Left 06/10/2015   Procedure: FOR MAXIMUM ACCESS (MAS) POSTERIOR LUMBAR INTERBODY FUSION (PLIF) LUMBAR THREE-FOUR EXTRAFORAMINAL MICRODISCECTOMY LUMBAR FIVE-SACRAL ONE LEFT;  Surgeon: Eustace Moore, MD;  Location: [facility];  Service: Neurosurgery;  Laterality: Left;   RADIOLOGY WITH ANESTHESIA N/A 04/08/2019   Procedure: RADIOLOGY WITH ANESTHESIA;  Surgeon: Radiologist, Medication, MD;  Location: [facility];  Service: Radiology;  Laterality: N/A;   SCLEROTHERAPY  04/07/2019   Procedure: SCLEROTHERAPY;  Surgeon: Juanita Craver, MD;  Location[facility] ENDOSCOPY;  Service: Endoscopy;;    TEE WITHOUT CARDIOVERSION N/A 01/28/2016   Procedure: TRANSESOPHAGEAL ECHOCARDIOGRAM (TEE);  Surgeon: Melrose Nakayama, MD;  Location: [facility];  Service: Open Heart Surgery;  Laterality: N/A;   TUBAL LIGATION  1982   Dr Lynwood Dawley HERNIA REPAIR N/A 09/10/2019   Procedure: HERNIA REPAIR UMBILICAL ADULT;  Surgeon: Donnie Mesa, MD;  Location: [facility];  Service: General;  Laterality: N/A;   UPPER GASTROINTESTINAL ENDOSCOPY     VAGINAL HYSTERECTOMY  1997   Dr Rande Lawman    Allergies  Allergen Reactions   Kiwi Extract Anaphylaxis   Tdap [Tetanus-Diphth-Acell Pertussis] Swelling and Other (See Comments)    Swelling at injection site, gets very hot   Statins Other (See Comments)    RHABDOMYOLYSIS   Latex Itching, Dermatitis and Rash   Tramadol Nausea And Vomiting    Outpatient Encounter Medications as of 06/08/2021  Medication Sig   acetaminophen (TYLENOL) 500 MG tablet Take 1,000 mg by mouth every 6 (six) hours.   Biotin 10000 MCG TABS Take 10,000 mcg by mouth daily.   Calcium Carb-Cholecalciferol (CALCIUM 600 + D PO) Take 1 tablet by mouth in the morning and at bedtime.   Cholecalciferol (VITAMIN D-3 PO) Take 1,000 Units by mouth daily.   Cyanocobalamin (VITAMIN B 12 PO) Take 1,000 mcg by mouth daily.   empagliflozin (JARDIANCE) 25 MG TABS tablet Take 1 tablet (25 mg total) by mouth daily.   ezetimibe (ZETIA) 10 MG tablet Take 1 tablet (10 mg total) by mouth daily.   fluticasone (FLONASE) 50 MCG/ACT nasal spray Place 1 spray into both nostrils daily for 3 days.   furosemide (LASIX) 20 MG tablet TAKE 1 TABLET(20 MG) BY MOUTH DAILY   glucose blood test strip One Touch Ultra strips. Use to test blood sugar three times daily. Dx: E11.65   insulin detemir (LEVEMIR FLEXTOUCH) 100 UNIT/ML FlexPen Inject 23 Units into the skin at bedtime.   Insulin Pen Needle (BD PEN NEEDLE NANO U/F) 32G X 4 MM MISC USE THREE TIMES DAILY AS DIRECTED   Insulin Syringe-Needle U-100 (INSULIN SYRINGE  1CC/31GX5/16") 31G X 5/16" 1 ML MISC USE AS DIRECTED TO INJECT LEVIMIR   lactulose (CHRONULAC) 10 GM/15ML solution Take 30 mLs (20 g total) by mouth daily.   MAGNESIUM PO Take 500 mg by mouth daily.    metoprolol tartrate (LOPRESSOR) 25 MG tablet Take 1 tablet (25 mg total) by mouth as needed (use as needed for HR greater than 110).   Multiple Vitamins-Minerals (MULTIVITAMIN WITH MINERALS) tablet Take 1 tablet by mouth daily.   Omega-3 Fatty Acids (FISH OIL PO) Take 1,000 mg by mouth daily.   pantoprazole (PROTONIX) 40 MG tablet TAKE 1 TABLET(40 MG) BY MOUTH DAILY   Polyethyl Glycol-Propyl Glycol (SYSTANE OP) Place 1 drop into both eyes at bedtime.   Probiotic Product (PROBIOTIC DAILY PO) Take 1 capsule by mouth daily. Digestive Advantage  Probiotic   XIFAXAN 550 MG TABS tablet Take 1 tablet (550 mg total) by mouth 2 (two) times daily.   [DISCONTINUED] benzonatate (TESSALON) 100 MG capsule Take 1 capsule (100 mg total) by mouth every 8 (eight) hours as needed for cough.   [DISCONTINUED] cephALEXin (KEFLEX) 500 MG capsule Take 1 capsule (500 mg total) by mouth 3 (three) times daily for 7 days.   No facility-administered encounter medications on file as of 06/08/2021.    Review of Systems  Constitutional:  Positive for unexpected weight change. Negative for appetite change, chills, fatigue and fever.       Weight loss 4 lbs   HENT:  Negative for congestion, dental problem, ear discharge, ear pain, facial swelling, hearing loss, nosebleeds, postnasal drip, rhinorrhea, sinus pressure, sinus pain, sneezing, sore throat, tinnitus and trouble swallowing.   Eyes:  Negative for pain, discharge, redness, itching and visual disturbance.  Respiratory:  Negative for cough, chest tightness, shortness of breath and wheezing.   Cardiovascular:  Negative for chest pain, palpitations and leg swelling.  Gastrointestinal:  Negative for abdominal distention, abdominal pain, blood in stool, constipation, diarrhea,  nausea and vomiting.  Endocrine: Negative for cold intolerance, heat intolerance, polydipsia, polyphagia and polyuria.  Genitourinary:  Negative for difficulty urinating, dysuria, flank pain, frequency and urgency.  Musculoskeletal:  Negative for arthralgias, back pain, gait problem, joint swelling, myalgias, neck pain and neck stiffness.  Skin:  Negative for color change, pallor, rash and wound.  Neurological:  Negative for dizziness, syncope, speech difficulty, weakness, light-headedness, numbness and headaches.  Hematological:  Does not bruise/bleed easily.  Psychiatric/Behavioral:  Negative for agitation, behavioral problems, confusion, hallucinations, self-injury, sleep disturbance and suicidal ideas. The patient is not nervous/anxious.    Immunization History  Administered Date(s) Administered   Fluad Quad(high Dose 65+) 01/31/2019, 02/03/2020, 02/17/2021   Hep A / Hep B 06/24/2016, 07/04/2016, 07/21/2016   Influenza Whole 02/28/2012   Influenza, High Dose Seasonal PF 01/26/2017, 01/29/2018   Influenza,inj,Quad PF,6+ Mos 03/27/2013, 01/16/2014, 03/31/2015, 02/26/2016   Pneumococcal Conjugate-13 06/28/2011   Pneumococcal Polysaccharide-23 04/19/2016   Td 01/12/1999   Pertinent  Health Maintenance Due  Topic Date Due   OPHTHALMOLOGY EXAM  08/24/2018   URINE MICROALBUMIN  06/15/2021   COLONOSCOPY (Pts 45-72yr Insurance coverage will need to be confirmed)  07/07/2021   HEMOGLOBIN A1C  11/27/2021   FOOT EXAM  02/17/2022   MAMMOGRAM  02/16/2023   INFLUENZA VACCINE  Completed   DEXA SCAN  Completed   Fall Risk 05/30/2021 05/31/2021 05/31/2021 06/01/2021 06/07/2021  Falls in the past year? - - - - 0  Was there an injury with Fall? - - - - 0  Fall Risk Category Calculator - - - - 0  Fall Risk Category - - - - Low  Patient Fall Risk Level Moderate fall risk Moderate fall risk Moderate fall risk Moderate fall risk Low fall risk  Patient at Risk for Falls Due to - - - - No Fall Risks   Fall risk Follow up - - - - Falls evaluation completed   Functional Status Survey:    Vitals:   06/08/21 1112  BP: 100/60  Pulse: 71  Resp: 16  Temp: (!) 97.1 F (36.2 C)  SpO2: 98%  Weight: 125 lb 12.8 oz (57.1 kg)  Height: 5' 3"  (1.6 m)   Body mass index is 22.28 kg/m. Physical Exam Vitals reviewed.  Constitutional:      General: She is not in acute distress.    Appearance:  Normal appearance. She is normal weight. She is not ill-appearing or diaphoretic.  HENT:     Head: Normocephalic.     Right Ear: Tympanic membrane, ear canal and external ear normal. There is no impacted cerumen.     Left Ear: Tympanic membrane, ear canal and external ear normal. There is no impacted cerumen.     Nose: Nose normal. No congestion or rhinorrhea.     Mouth/Throat:     Mouth: Mucous membranes are moist.     Pharynx: Oropharynx is clear. No oropharyngeal exudate or posterior oropharyngeal erythema.  Eyes:     General: No scleral icterus.       Right eye: No discharge.        Left eye: No discharge.     Extraocular Movements: Extraocular movements intact.     Conjunctiva/sclera: Conjunctivae normal.     Pupils: Pupils are equal, round, and reactive to light.  Neck:     Vascular: No carotid bruit.  Cardiovascular:     Rate and Rhythm: Normal rate and regular rhythm.     Pulses: Normal pulses.     Heart sounds: Normal heart sounds. No murmur heard.   No friction rub. No gallop.  Pulmonary:     Effort: Pulmonary effort is normal. No respiratory distress.     Breath sounds: Normal breath sounds. No wheezing, rhonchi or rales.  Chest:     Chest wall: No tenderness.  Abdominal:     General: Bowel sounds are normal. There is no distension.     Palpations: Abdomen is soft. There is no mass.     Tenderness: There is no abdominal tenderness. There is no right CVA tenderness, left CVA tenderness, guarding or rebound.  Musculoskeletal:        General: No swelling or tenderness. Normal  range of motion.     Cervical back: Normal range of motion. No rigidity or tenderness.     Right lower leg: No edema.     Left lower leg: No edema.  Lymphadenopathy:     Cervical: No cervical adenopathy.  Skin:    General: Skin is warm and dry.     Coloration: Skin is not pale.     Findings: No bruising, erythema, lesion or rash.  Neurological:     Mental Status: She is alert and oriented to person, place, and time.     Cranial Nerves: No cranial nerve deficit.     Sensory: No sensory deficit.     Motor: No weakness.     Coordination: Coordination normal.     Gait: Gait normal.  Psychiatric:        Mood and Affect: Mood normal.        Speech: Speech normal.        Behavior: Behavior normal.        Thought Content: Thought content normal.        Judgment: Judgment normal.    Labs reviewed: Recent Labs    05/30/21 2133 05/31/21 0238 06/01/21 0453  NA 135 133* 139  K 3.9 4.0 3.6  CL 98 105 110  CO2 20* 21* 24  GLUCOSE 417* 262* 105*  BUN 23 29* 20  CREATININE 1.13* 0.87 0.81  CALCIUM 8.7* 8.7* 8.3*   Recent Labs    03/11/21 1246 05/29/21 1842 05/30/21 0417  AST 60* 90* 63*  ALT 45* 51* 44  ALKPHOS 109 112 94  BILITOT 2.2* 2.1* 2.3*  PROT 6.8 7.0 6.6  ALBUMIN 3.1* 3.0* 2.7*  Recent Labs    06/10/20 0826 10/13/20 0843 03/11/21 1246 05/29/21 1842 05/30/21 0417 06/01/21 0453  WBC 3.2* 2.8*   < > 7.7 9.7 6.3  NEUTROABS 1,690 1,481*  --   --   --   --   HGB 13.3 13.5   < > 13.7 13.4 11.7*  HCT 38.0 40.8   < > 41.4 41.0 35.4*  MCV 91.3 91.9   < > 95.6 95.1 93.4  PLT 92* 82*   < > 122* 114* 93*   < > = values in this interval not displayed.   Lab Results  Component Value Date   TSH 2.78 10/13/2020   Lab Results  Component Value Date   HGBA1C 9.6 (H) 05/30/2021   Lab Results  Component Value Date   CHOL 168 12/15/2020   HDL 85 12/15/2020   LDLCALC 70 12/15/2020   TRIG 71 12/15/2020   CHOLHDL 2.0 12/15/2020    Significant Diagnostic Results in  last 30 days:  CT Abdomen Pelvis W Contrast  Result Date: 05/29/2021 CLINICAL DATA:  Left side abdominal pain. Flank pain, kidney stone suspected EXAM: CT ABDOMEN AND PELVIS WITH CONTRAST TECHNIQUE: Multidetector CT imaging of the abdomen and pelvis was performed using the standard protocol following bolus administration of intravenous contrast. RADIATION DOSE REDUCTION: This exam was performed according to the departmental dose-optimization program which includes automated exposure control, adjustment of the mA and/or kV according to patient size and/or use of iterative reconstruction technique. CONTRAST:  154m OMNIPAQUE IOHEXOL 300 MG/ML  SOLN COMPARISON:  04/06/2019 FINDINGS: Lower chest: No acute abnormality. Hepatobiliary: TIPS shunt noted within the liver. Changes of cirrhosis. No focal hepatic abnormality. Gallbladder grossly unremarkable. Pancreas: No focal abnormality or ductal dilatation. Spleen: Splenomegaly with a craniocaudal length of 13.7  cm. Adrenals/Urinary Tract: Left hydronephrosis due to 4 mm proximal left ureteral stone. No stones or hydronephrosis on the right. Adrenal glands and urinary bladder unremarkable. Stomach/Bowel: Stomach, large and small bowel grossly unremarkable. Vascular/Lymphatic: No evidence of aneurysm or adenopathy. Reproductive: Prior hysterectomy.  No adnexal masses. Other: Trace free fluid in the pelvis.  No free air. Musculoskeletal: No acute bony abnormality. IMPRESSION: 4 mm proximal left ureteral stone with mild left hydronephrosis. Cirrhosis with associated splenomegaly.  Prior TIPS. Electronically Signed   By: KRolm BaptiseM.D.   On: 05/29/2021 22:12   DG Retrograde Pyelogram  Result Date: 05/30/2021 CLINICAL DATA:  Ureteral stone. EXAM: RETROGRADE PYELOGRAM COMPARISON:  CT AP, 05/29/2021. FLUOROSCOPY TIME:  Fluoroscopy Time:  14.8 seconds Radiation Exposure Index (if provided by the fluoroscopic device): 2.5 mGy Number of Acquired Spot Images: 4 static  images FINDINGS: Multiple, limited oblique planar images of the LEFT abdomen and pelvis obtained C-arm. Images demonstrating LEFT ureteral cannulation, retrograde pyelogram and LEFT nephroureteral stent placement. Moderate LEFT hydronephrosis and a proximal ureteral filling defect is demonstrated. See key image. IMPRESSION: 1. LEFT retrograde pyelogram and nephroureteral stent placement. 2. Moderate LEFT hydronephrosis with proximal ureteral filling defect is demonstrated. For complete description of intra procedural findings, please see performing service dictation. Electronically Signed   By: JMichaelle BirksM.D.   On: 05/30/2021 10:03   DG C-Arm 1-60 Min-No Report  Result Date: 05/30/2021 Fluoroscopy was utilized by the requesting physician.  No radiographic interpretation.    Assessment/Plan 1. Essential hypertension B/p well controlled. - continue on metoprolol PRN and furosemide  - BMP with eGFR([facility]) - CBC with Differential/Platelet  2. Type 2 diabetes mellitus with diabetic nephropathy, with long-term current use of insulin (  [place]) Lab Results  Component Value Date   HGBA1C 9.6 (H) 05/30/2021   - Hypoglycemia reported in the 50's -60's   Administering 30 units at home but med list indicates 23 units.  - Advised to reduce levemir from 30 units to 25 units at bedtime   - continue on Jardiance   - Notify provider if blood sugars are less than 75 or greater than 150   - insulin detemir (LEVEMIR FLEXTOUCH) 100 UNIT/ML FlexPen; Inject 25 Units into the skin at bedtime.  Dispense: 15 mL; Refill: 3 - follow up as scheduled with Dr.Miller on 06/18/2021 to re-evaluate blood sugars.   3. Ureteral stone with hydronephrosis Status post hospital admission post cystoscopy with stent placement on 05/30/2021. - follow up with Urologist as scheduled  - Notify provider for any blood in the urine   4. Cirrhosis of liver with ascites, unspecified hepatic cirrhosis type (HCC) No signs of fluid over  load  - continue on Rifaximin ,lactuloses and furosemide   5. Chronic diastolic heart failure (HCC) No signs of fluid overload. - continue on furosemide   6. Hyponatremia Na+ 133  Will recheck BMP   7. Thrombocytopenia (HCC) Plts 122 thought due to her liver cirrhosis. - will recheck CBC/diff   8. Paroxysmal atrial fibrillation (HCC) HR controlled  - continue on Metoprolol 25 mg tablet daily as needed   Family/ staff Communication: Reviewed plan of care with patient and Husband verbalized understanding.   Labs/tests ordered:  - BMP with eGFR([facility]) - CBC with Differential/Platelet  Next Appointment: Has upcoming appointment with Dr.Miller on 06/18/2021   Sandrea Hughs, NP

## 2021-06-08 NOTE — Patient Instructions (Signed)
Reduce Levemir from 30  units daily to 25 units daily due to low blood sugars.Notify provider if blood sugars are less than 75 or greater than 150

## 2021-06-15 ENCOUNTER — Other Ambulatory Visit (INDEPENDENT_AMBULATORY_CARE_PROVIDER_SITE_OTHER): Payer: Medicare Other

## 2021-06-15 ENCOUNTER — Ambulatory Visit (HOSPITAL_COMMUNITY)
Admission: RE | Admit: 2021-06-15 | Discharge: 2021-06-15 | Disposition: A | Discharge status: Home / Self Care | Payer: Medicare Other | Source: Ambulatory Visit | Attending: Gastroenterology | Admitting: Gastroenterology

## 2021-06-15 ENCOUNTER — Other Ambulatory Visit: Payer: Self-pay

## 2021-06-15 DIAGNOSIS — R188 Other ascites: Secondary | ICD-10-CM | POA: Diagnosis not present

## 2021-06-15 DIAGNOSIS — K7460 Unspecified cirrhosis of liver: Secondary | ICD-10-CM | POA: Diagnosis not present

## 2021-06-15 DIAGNOSIS — R161 Splenomegaly, not elsewhere classified: Secondary | ICD-10-CM | POA: Diagnosis not present

## 2021-06-15 DIAGNOSIS — Z8719 Personal history of other diseases of the digestive system: Secondary | ICD-10-CM | POA: Diagnosis not present

## 2021-06-15 DIAGNOSIS — Z981 Arthrodesis status: Secondary | ICD-10-CM | POA: Diagnosis not present

## 2021-06-15 LAB — CBC
HCT: 38.9 % (ref 36.0–46.0)
Hemoglobin: 13.1 g/dL (ref 12.0–15.0)
MCHC: 33.7 g/dL (ref 30.0–36.0)
MCV: 93.3 fl (ref 78.0–100.0)
Platelets: 97 10*3/uL — ABNORMAL LOW (ref 150.0–400.0)
RBC: 4.17 Mil/uL (ref 3.87–5.11)
RDW: 14.3 % (ref 11.5–15.5)
WBC: 4.2 10*3/uL (ref 4.0–10.5)

## 2021-06-15 LAB — COMPREHENSIVE METABOLIC PANEL
ALT: 33 U/L (ref 0–35)
AST: 40 U/L — ABNORMAL HIGH (ref 0–37)
Albumin: 3.3 g/dL — ABNORMAL LOW (ref 3.5–5.2)
Alkaline Phosphatase: 90 U/L (ref 39–117)
BUN: 16 mg/dL (ref 6–23)
CO2: 29 mEq/L (ref 19–32)
Calcium: 9.2 mg/dL (ref 8.4–10.5)
Chloride: 104 mEq/L (ref 96–112)
Creatinine, Ser: 0.71 mg/dL (ref 0.40–1.20)
GFR: 83.7 mL/min (ref >60.00)
Glucose, Bld: 98 mg/dL (ref 70–99)
Potassium: 3.8 mEq/L (ref 3.5–5.1)
Sodium: 140 mEq/L (ref 135–145)
Total Bilirubin: 2.1 mg/dL — ABNORMAL HIGH (ref 0.2–1.2)
Total Protein: 6.8 g/dL (ref 6.0–8.3)

## 2021-06-15 LAB — PROTIME-INR
INR: 1 ratio (ref 0.8–1.0)
Prothrombin Time: 11.5 s (ref 9.6–13.1)

## 2021-06-15 MED ORDER — GADOBUTROL 1 MMOL/ML IV SOLN
6.0000 mL | Freq: Once | INTRAVENOUS | Status: AC | PRN
  Administered 2021-06-15: 6 mL via INTRAVENOUS

## 2021-06-16 ENCOUNTER — Other Ambulatory Visit: Payer: Self-pay

## 2021-06-16 DIAGNOSIS — K7460 Unspecified cirrhosis of liver: Secondary | ICD-10-CM

## 2021-06-16 LAB — AFP TUMOR MARKER: AFP-Tumor Marker: 2.2 ng/mL

## 2021-06-21 ENCOUNTER — Telehealth: Payer: Self-pay | Admitting: Gastroenterology

## 2021-06-21 NOTE — Telephone Encounter (Signed)
Patient called states she has not heard anything from anyone regarding lab results.

## 2021-06-21 NOTE — Telephone Encounter (Signed)
°  The patient has been notified of this information and all questions answered.   [name],  your labs all look very good and your MRI shows no sign of tumors in your liver.  This is all very good news.  Your current MELD score is 9 which is good, low and basically says your liver is functioning nicely.  I would like to see you back in the office in about 6 months and we will help coordinate that visit.  Certainly call sooner if you have any troubles that you think I can help with.

## 2021-06-22 ENCOUNTER — Encounter: Payer: Self-pay | Admitting: Family Medicine

## 2021-06-22 ENCOUNTER — Ambulatory Visit (INDEPENDENT_AMBULATORY_CARE_PROVIDER_SITE_OTHER): Payer: Medicare Other | Admitting: Family Medicine

## 2021-06-22 ENCOUNTER — Other Ambulatory Visit: Payer: Self-pay

## 2021-06-22 VITALS — BP 132/64 | HR 74 | Temp 98.0°F | Ht 63.0 in | Wt 125.4 lb

## 2021-06-22 DIAGNOSIS — N951 Menopausal and female climacteric states: Secondary | ICD-10-CM | POA: Diagnosis not present

## 2021-06-22 DIAGNOSIS — K7460 Unspecified cirrhosis of liver: Secondary | ICD-10-CM

## 2021-06-22 DIAGNOSIS — L0231 Cutaneous abscess of buttock: Secondary | ICD-10-CM | POA: Diagnosis not present

## 2021-06-22 DIAGNOSIS — R188 Other ascites: Secondary | ICD-10-CM

## 2021-06-22 DIAGNOSIS — E1121 Type 2 diabetes mellitus with diabetic nephropathy: Secondary | ICD-10-CM | POA: Diagnosis not present

## 2021-06-22 DIAGNOSIS — Z794 Long term (current) use of insulin: Secondary | ICD-10-CM

## 2021-06-22 DIAGNOSIS — L03317 Cellulitis of buttock: Secondary | ICD-10-CM

## 2021-06-22 DIAGNOSIS — I10 Essential (primary) hypertension: Secondary | ICD-10-CM | POA: Diagnosis not present

## 2021-06-22 MED ORDER — MUPIROCIN CALCIUM 2 % EX CREA: 1.0000 "application " | TOPICAL_CREAM | Freq: Two times a day (BID) | CUTANEOUS | 0 refills | Status: DC

## 2021-06-22 MED ORDER — LORAZEPAM 0.5 MG PO TABS: 0.5000 mg | ORAL_TABLET | Freq: Two times a day (BID) | ORAL | 1 refills | Status: DC | PRN

## 2021-06-22 NOTE — Patient Instructions (Signed)
Take med for sleep 2-3 tomes a week and return 3 months to check liver

## 2021-06-22 NOTE — Progress Notes (Signed)
Provider:  Alain Honey, MD  Careteam: Patient Care Team: [name], MD as PCP - General (Family Medicine) Josue Hector, MD as PCP - Cardiology (Cardiology) Berle Mull, MD as Consulting Physician (Family Medicine) Sharmon Revere as Physician Assistant (Cardiology) Calvert Cantor, MD as Consulting Physician (Ophthalmology) [name], MD as Attending Physician (Gastroenterology)  PLACE OF SERVICE:  [place]  Advanced Directive information    Allergies  Allergen Reactions   Kiwi Extract Anaphylaxis   Tdap [Tetanus-Diphth-Acell Pertussis] Swelling and Other (See Comments)    Swelling at injection site, gets very hot   Statins Other (See Comments)    RHABDOMYOLYSIS   Latex Itching, Dermatitis and Rash   Tramadol Nausea And Vomiting    Chief Complaint  Patient presents with   Medical Management of Chronic Issues    Patient presents today for a 4 month follow-up   Quality Metric Gaps    Eye exam, zoster, COVID, microalbumin     HPI: Patient is a 76 y.o. female patient is here for follow-up exam she was last seen for transitions of care after she had been in the hospital for a UTI with kidney stone. Today her concerns are to.  First she is having problems with sleeping.  She does take Benadryl occasionally but this seems to dry her out too much.  She has tried melatonin in the past.  She is concerned that medicines may affect her liver and is hesitant due to that. Also has a sore place on her right buttock.  Has been present about 6 months.  She has tried numerous OTC medicines without effect.  There is no drainage and she denies any other source. Regarding her diabetes, since she has been out of the hospital sugars have been fairly stable.  She checks sugars once a day usually in the mornings.  He has an upcoming eye appointment with ophthalmology.  Review of Systems:  Review of Systems  Eyes: Negative.   Respiratory: Negative.     Cardiovascular: Negative.   Gastrointestinal: Negative.   Genitourinary: Negative.   Neurological: Negative.   Psychiatric/Behavioral:  The patient has insomnia.   All other systems reviewed and are negative.  Past Medical History:  Diagnosis Date   A-fib ([place])    08/19/19 afib with RVR, spontaneously converted to SR   Allergy    Arthritis    neck   Cataract    bilateral - MD monitoring cataracts   CHF (congestive heart failure) (HCC)    Chronic kidney disease, stage I    DR OTTELIN  HX UTIS   Cirrhosis ([place])    Coronary artery disease    s/p CABG 01/29/16: LIMA-LAD, SVG-RI, SVG-dRCA   COVID-19 virus infection 04/2020   Cramp of limb    Diabetes mellitus    Dysphagia, unspecified(787.20)    Dysuria    Epistaxis    GERD (gastroesophageal reflux disease)    Hearing loss    wears hearing aids   Heart murmur     DX FOR YEARS ASYMPTOMATIC   Hepatic encephalopathy    Lumbago    Myocardial infarction ([place])    NSTEMI 01/27/16   Neoplasm of uncertain behavior of skin    Nonspecific elevation of levels of transaminase or lactic acid dehydrogenase (LDH)    Osteoarthrosis, unspecified whether generalized or localized, unspecified site    Other and unspecified hyperlipidemia    diet controlled   Pain in joint, shoulder region    Paresthesias  04/01/2015   Postablative ovarian failure    Trochanteric bursitis of left hip 12/15/2015   Type 2 diabetes mellitus without complication (HCC)    Unspecified essential hypertension    no meds   Wears glasses    Past Surgical History:  Procedure Laterality Date   BACK SURGERY  06/2015   BREAST BIOPSY Left 12/18/2019   CARDIAC CATHETERIZATION N/A 01/27/2016   Procedure: Left Heart Cath and Coronary Angiography;  Surgeon: Belva Crome, MD;  Location: [place] [facility];  Service: Cardiovascular;  Laterality: N/A;   COLONOSCOPY  2012   Dr Lajoyce Corners.    COLONOSCOPY WITH PROPOFOL N/A 07/07/2016   Procedure: COLONOSCOPY WITH PROPOFOL;  Surgeon:  [name], MD;  Location: [facility];  Service: Endoscopy;  Laterality: N/A;   CORONARY ARTERY BYPASS GRAFT N/A 01/28/2016   Procedure: CORONARY ARTERY BYPASS GRAFTING (CABG) x 3 USING RIGHT LEG GREATER SAPHENOUS VEIN GRAFT;  Surgeon: Melrose Nakayama, MD;  Location: [place] [facility];  Service: Open Heart Surgery;  Laterality: N/A;   CYSTOSCOPY WITH RETROGRADE PYELOGRAM, URETEROSCOPY AND STENT PLACEMENT Left 05/30/2021   Procedure: CYSTOSCOPY WITH RETROGRADE PYELOGRAM, URETEROSCOPY AND STENT PLACEMENT;  Surgeon: Ceasar Mons, MD;  Location: [place];  Service: Urology;  Laterality: Left;   ENDOVEIN HARVEST OF GREATER SAPHENOUS VEIN Right 01/28/2016   Procedure: ENDOVEIN HARVEST OF GREATER SAPHENOUS VEIN;  Surgeon: Melrose Nakayama, MD;  Location: [place];  Service: Open Heart Surgery;  Laterality: Right;   ESOPHAGEAL BANDING  03/28/2019   Procedure: ESOPHAGEAL BANDING;  Surgeon: [name], MD;  Location: [facility];  Service: Endoscopy;;   ESOPHAGEAL BANDING  04/07/2019   Procedure: ESOPHAGEAL BANDING;  Surgeon: Juanita Craver, MD;  Location[facility];  Service: Endoscopy;;   ESOPHAGOGASTRODUODENOSCOPY N/A 04/07/2019   Procedure: ESOPHAGOGASTRODUODENOSCOPY (EGD);  Surgeon: Juanita Craver, MD;  Location: [facility];  Service: Endoscopy;  Laterality: N/A;   ESOPHAGOGASTRODUODENOSCOPY (EGD) WITH PROPOFOL N/A 07/07/2016   Procedure: ESOPHAGOGASTRODUODENOSCOPY (EGD) WITH PROPOFOL;  Surgeon: [name], MD;  Location: [facility];  Service: Endoscopy;  Laterality: N/A;   ESOPHAGOGASTRODUODENOSCOPY (EGD) WITH PROPOFOL N/A 03/28/2019   Procedure: ESOPHAGOGASTRODUODENOSCOPY (EGD) WITH PROPOFOL;  Surgeon: [name], MD;  Location: [facility];  Service: Endoscopy;  Laterality: N/A;   HEMOSTASIS CLIP PLACEMENT  04/07/2019   Procedure: HEMOSTASIS CLIP PLACEMENT;  Surgeon: Juanita Craver, MD;  Location: [facility];  Service: Endoscopy;;   IR ANGIOGRAM SELECTIVE EACH  ADDITIONAL VESSEL  04/08/2019   IR EMBO ART  VEN HEMORR LYMPH EXTRAV  INC GUIDE ROADMAPPING  04/08/2019   IR PARACENTESIS  04/08/2019   IR RADIOLOGIST EVAL & MGMT  06/13/2019   IR RADIOLOGIST EVAL & MGMT  09/25/2019   IR RADIOLOGIST EVAL & MGMT  07/07/2020   IR TIPS  04/08/2019   MAXIMUM ACCESS (MAS)POSTERIOR LUMBAR INTERBODY FUSION (PLIF) 1 LEVEL Left 06/10/2015   Procedure: FOR MAXIMUM ACCESS (MAS) POSTERIOR LUMBAR INTERBODY FUSION (PLIF) LUMBAR THREE-FOUR EXTRAFORAMINAL MICRODISCECTOMY LUMBAR FIVE-SACRAL ONE LEFT;  Surgeon: Eustace Moore, MD;  Location: [facility];  Service: Neurosurgery;  Laterality: Left;   RADIOLOGY WITH ANESTHESIA N/A 04/08/2019   Procedure: RADIOLOGY WITH ANESTHESIA;  Surgeon: Radiologist, Medication, MD;  Location: [place];  Service: Radiology;  Laterality: N/A;   SCLEROTHERAPY  04/07/2019   Procedure: SCLEROTHERAPY;  Surgeon: Juanita Craver, MD;  Location: [facility] ENDOSCOPY;  Service: Endoscopy;;   TEE WITHOUT CARDIOVERSION N/A 01/28/2016   Procedure: TRANSESOPHAGEAL ECHOCARDIOGRAM (TEE);  Surgeon: Melrose Nakayama, MD;  Location: [place];  Service: Open Heart Surgery;  Laterality: N/A;   TUBAL LIGATION  1982   Dr Lynwood Dawley HERNIA REPAIR N/A 09/10/2019   Procedure: HERNIA REPAIR UMBILICAL ADULT;  Surgeon: Donnie Mesa, MD;  Location: [place];  Service: General;  Laterality: N/A;   UPPER GASTROINTESTINAL ENDOSCOPY     VAGINAL HYSTERECTOMY  1997   Dr Rande Lawman   Social History:   reports that she has never smoked. She has never used smokeless tobacco. She reports that she does not drink alcohol and does not use drugs.  Family History  Problem Relation Age of Onset   Heart disease Mother    Breast cancer Mother    Lung cancer Father    Arthritis Sister    Arthritis Brother    Liver cancer Brother    Breast cancer Maternal Aunt    Breast cancer Paternal Aunt    Heart disease Maternal Grandmother    Heart disease Maternal Grandfather    Heart disease  Paternal Grandmother    Heart disease Paternal Grandfather    Other Daughter        house fire   Colon cancer Neg Hx    Esophageal cancer Neg Hx    Rectal cancer Neg Hx    Stomach cancer Neg Hx     Medications: Patient's Medications  New Prescriptions   LORAZEPAM (ATIVAN) 0.5 MG TABLET    Take 1 tablet (0.5 mg total) by mouth 2 (two) times daily as needed for anxiety.   MUPIROCIN CREAM (BACTROBAN) 2 %    Apply 1 application topically 2 (two) times daily.  Previous Medications   ACETAMINOPHEN (TYLENOL) 500 MG TABLET    Take 1,000 mg by mouth every 6 (six) hours.   BIOTIN 16109 MCG TABS    Take 10,000 mcg by mouth daily.   CALCIUM CARB-CHOLECALCIFEROL (CALCIUM 600 + D PO)    Take 1 tablet by mouth in the morning and at bedtime.   CHOLECALCIFEROL (VITAMIN D-3 PO)    Take 1,000 Units by mouth daily.   CYANOCOBALAMIN (VITAMIN B 12 PO)    Take 1,000 mcg by mouth daily.   EMPAGLIFLOZIN (JARDIANCE) 25 MG TABS TABLET    Take 1 tablet (25 mg total) by mouth daily.   EZETIMIBE (ZETIA) 10 MG TABLET    Take 1 tablet (10 mg total) by mouth daily.   FLUTICASONE (FLONASE) 50 MCG/ACT NASAL SPRAY    Place 1 spray into both nostrils daily for 3 days.   FUROSEMIDE (LASIX) 20 MG TABLET    TAKE 1 TABLET(20 MG) BY MOUTH DAILY   GLUCOSE BLOOD TEST STRIP    One Touch Ultra strips. Use to test blood sugar three times daily. Dx: E11.65   INSULIN DETEMIR (LEVEMIR FLEXTOUCH) 100 UNIT/ML FLEXPEN    Inject 25 Units into the skin at bedtime.   INSULIN PEN NEEDLE (BD PEN NEEDLE NANO U/F) 32G X 4 MM MISC    USE THREE TIMES DAILY AS DIRECTED   INSULIN SYRINGE-NEEDLE U-100 (INSULIN SYRINGE 1CC/31GX5/16") 31G X 5/16" 1 ML MISC    USE AS DIRECTED TO INJECT LEVIMIR   LACTULOSE (CHRONULAC) 10 GM/15ML SOLUTION    Take 30 mLs (20 g total) by mouth daily.   MAGNESIUM PO    Take 500 mg by mouth daily.    METOPROLOL TARTRATE (LOPRESSOR) 25 MG TABLET    Take 1 tablet (25 mg total) by mouth as needed (use as needed for HR greater  than 110).   MULTIPLE VITAMINS-MINERALS (MULTIVITAMIN  WITH MINERALS) TABLET    Take 1 tablet by mouth daily.   OMEGA-3 FATTY ACIDS (FISH OIL PO)    Take 1,000 mg by mouth daily.   PANTOPRAZOLE (PROTONIX) 40 MG TABLET    TAKE 1 TABLET(40 MG) BY MOUTH DAILY   POLYETHYL GLYCOL-PROPYL GLYCOL (SYSTANE OP)    Place 1 drop into both eyes at bedtime.   PROBIOTIC PRODUCT (PROBIOTIC DAILY PO)    Take 1 capsule by mouth daily. Digestive Advantage Probiotic   XIFAXAN 550 MG TABS TABLET    Take 1 tablet (550 mg total) by mouth 2 (two) times daily.  Modified Medications   No medications on file  Discontinued Medications   No medications on file    Physical Exam:  Vitals:   06/22/21 1320  BP: 132/64  Pulse: 74  Temp: 98 F (36.7 C)  SpO2: 98%  Weight: 125 lb 6.4 oz (56.9 kg)  Height: 5' 3"  (1.6 m)   Body mass index is 22.21 kg/m. Wt Readings from Last 3 Encounters:  06/22/21 125 lb 6.4 oz (56.9 kg)  06/08/21 125 lb 12.8 oz (57.1 kg)  05/30/21 130 lb 8.2 oz (59.2 kg)    Physical Exam Vitals and nursing note reviewed.  Constitutional:      Appearance: Normal appearance.  Cardiovascular:     Rate and Rhythm: Normal rate.  Pulmonary:     Effort: Pulmonary effort is normal.  Skin:    Comments: Small sore on right buttock.  Nonspecific.  Not fluctuant or indurated  Neurological:     General: No focal deficit present.     Mental Status: She is alert and oriented to person, place, and time.    Labs reviewed: Basic Metabolic Panel: Recent Labs    10/13/20 0843 03/11/21 1246 06/01/21 0453 06/08/21 1140 06/15/21 0904  NA 143   < > 139 137 140  K 3.7   < > 3.6 4.3 3.8  CL 107   < > 110 101 104  CO2 26   < > 24 30 29   GLUCOSE 93   < > 105* 341* 98  BUN 16   < > 20 19 16   CREATININE 0.79   < > 0.81 0.83 0.71  CALCIUM 10.0   < > 8.3* 10.0 9.2  TSH 2.78  --   --   --   --    < > = values in this interval not displayed.   Liver Function Tests: Recent Labs    05/29/21 1842  05/30/21 0417 06/15/21 0904  AST 90* 63* 40*  ALT 51* 44 33  ALKPHOS 112 94 90  BILITOT 2.1* 2.3* 2.1*  PROT 7.0 6.6 6.8  ALBUMIN 3.0* 2.7* 3.3*   Recent Labs    03/11/21 1246 05/29/21 1842  LIPASE 26 28   No results for input(s): AMMONIA in the last 8760 hours. CBC: Recent Labs    10/13/20 0843 03/11/21 1246 06/01/21 0453 06/08/21 1140 06/15/21 0904  WBC 2.8*   < > 6.3 4.6 4.2  NEUTROABS 1,481*  --   --  2,907  --   HGB 13.5   < > 11.7* 13.9 13.1  HCT 40.8   < > 35.4* 41.5 38.9  MCV 91.9   < > 93.4 94.7 93.3  PLT 82*   < > 93* 109* 97.0*   < > = values in this interval not displayed.   Lipid Panel: Recent Labs    12/15/20 0950  CHOL 168  HDL 85  LDLCALC  70  TRIG 71  CHOLHDL 2.0   TSH: Recent Labs    10/13/20 0843  TSH 2.78   A1C: Lab Results  Component Value Date   HGBA1C 9.6 (H) 05/30/2021     Assessment/Plan 1. Cellulitis and abscess of buttock This sore is not really an abscess but will try antibiotic, mupirocin to apply twice daily  2. Insomnia associated with menopause We will try low-dose lorazepam.  Instructions are take 2-3 times a week return in 3 months to monitor liver enzymes  3. Cirrhosis of liver with ascites, unspecified hepatic cirrhosis type ([place]) Last MELD result was good with normal liver functions.  She is followed by gastroenterology  4. Essential hypertensio blood pressure is good today 132/64 on metoprolol  5. Type 2 diabetes mellitus with diabetic nephropathy, with long-term current use of insulin (HCC) Using Jardiance 25 mg daily and basal insulin 25 units at bedtime.  Lowest sugar has been 51.  Last A1c was elevated.  We will need to recheck at next visit in 39-month  SAlain Honey MD [phone]

## 2021-06-24 DIAGNOSIS — N201 Calculus of ureter: Secondary | ICD-10-CM | POA: Diagnosis not present

## 2021-06-25 ENCOUNTER — Telehealth: Payer: Self-pay

## 2021-06-25 ENCOUNTER — Telehealth: Payer: Self-pay | Admitting: Gastroenterology

## 2021-06-25 ENCOUNTER — Other Ambulatory Visit: Payer: Self-pay

## 2021-06-25 MED ORDER — MUPIROCIN 2 % EX OINT: TOPICAL_OINTMENT | Freq: Two times a day (BID) | CUTANEOUS | Status: DC

## 2021-06-25 MED ORDER — MUPIROCIN 2 % EX OINT: 1.0000 "application " | TOPICAL_OINTMENT | Freq: Two times a day (BID) | CUTANEOUS | 0 refills | Status: AC

## 2021-06-25 NOTE — Telephone Encounter (Signed)
Prescription sent

## 2021-06-25 NOTE — Telephone Encounter (Signed)
Resend script for mupirocin 2 % ointment apply topically to affected area twice daily x 7 days

## 2021-06-25 NOTE — Telephone Encounter (Signed)
Returned call to patient to confirm what pharmacy she would like medication sent to as she normally gets [employer] from [place] Patient Assistance.  I called to [facility] Patient Assistance program and was advised that paper work has not been received by program.  I called patient's son [name] to get further information on whether or not patient had mailed patient assistance paper work to [employer].  According to patient she mailed paper work in Nov 2022, but her son does not recall if paper work was actually mailed.  [name] stated he would download form offline and haved his mom to complete and bring in for Dr [name] to complete and then be faxed.  [name] did confirm that patient has one full bottle and half of another bottle of Xifaxan at her home.  [name] aware that we will complete our portion of the paper work once it has been dropped off at the front desk.

## 2021-06-25 NOTE — Telephone Encounter (Signed)
This encounter was created in error - please disregard.

## 2021-06-25 NOTE — Telephone Encounter (Signed)
Patient left vm on clinical intake voicemail stating that medication Dr. Lillette Boxer.Miller sent to pharmacy was suppose to be an ointment not a cream. In reviewing office notes from 06/22/2021 it does not specify whether it is suppose to be cream or ointment. Please advise and send new prescription for ointment to pharmacy.  Message routed to Marlowe Sax, NP (covering provider)

## 2021-06-25 NOTE — Telephone Encounter (Signed)
Inbound call from patient stating that she is in need of Xifaxan refill. Please advise.

## 2021-06-30 ENCOUNTER — Other Ambulatory Visit: Payer: Self-pay

## 2021-06-30 MED ORDER — XIFAXAN 550 MG PO TABS: 550.0000 mg | ORAL_TABLET | Freq: Two times a day (BID) | ORAL | 3 refills | Status: DC

## 2021-07-02 ENCOUNTER — Other Ambulatory Visit: Payer: Self-pay | Admitting: Interventional Radiology

## 2021-07-02 ENCOUNTER — Other Ambulatory Visit: Payer: Self-pay | Admitting: Urology

## 2021-07-02 DIAGNOSIS — Z95828 Presence of other vascular implants and grafts: Secondary | ICD-10-CM

## 2021-07-07 ENCOUNTER — Encounter: Payer: Self-pay | Admitting: Gastroenterology

## 2021-07-09 NOTE — Progress Notes (Signed)
Office Visit    Patient Name: [name] Date of Encounter: 07/16/2021  PCP:  [name], [place]  Cardiologist:  [name], MD  Advanced Practice Provider:  Liliane Shi, PA-C Electrophysiologist:  None    Chief Complaint    [name] is a 76 y.o. female with a hx of HTN, HLD, Dm2, CAD s/p CABG, ascites/cirrhosis, bilateral carotid artery disease, atrial fibrillation presents today for 6 month follow up I have not seen her in 3 years   Past Medical History    Past Medical History:  Diagnosis Date   A-fib ([place])    08/19/19 afib with RVR, spontaneously converted to SR   Allergy    Arthritis    neck   Cataract    bilateral - MD monitoring cataracts   CHF (congestive heart failure) ([place])    Chronic kidney disease, stage I    DR Karsten Ro  HX UTIS   Cirrhosis ([place])    Coronary artery disease    s/p CABG 01/29/16: LIMA-LAD, SVG-RI, SVG-dRCA   COVID-19 virus infection 04/2020   Cramp of limb    Diabetes mellitus    Dysphagia, unspecified(787.20)    Dysuria    Epistaxis    GERD (gastroesophageal reflux disease)    Hearing loss    wears hearing aids   Heart murmur     DX FOR YEARS ASYMPTOMATIC   Hepatic encephalopathy    Lumbago    Myocardial infarction ([place])    NSTEMI 01/27/16   Neoplasm of uncertain behavior of skin    Nonspecific elevation of levels of transaminase or lactic acid dehydrogenase (LDH)    Osteoarthrosis, unspecified whether generalized or localized, unspecified site    Other and unspecified hyperlipidemia    diet controlled   Pain in joint, shoulder region    Paresthesias 04/01/2015   Postablative ovarian failure    Trochanteric bursitis of left hip 12/15/2015   Type 2 diabetes mellitus without complication ([place])    Unspecified essential hypertension    no meds   Wears glasses    Past Surgical History:  Procedure Laterality Date   BACK SURGERY  06/2015   BREAST BIOPSY Left  12/18/2019   CARDIAC CATHETERIZATION N/A 01/27/2016   Procedure: Left Heart Cath and Coronary Angiography;  Surgeon: Belva Crome, MD;  Location: [facility];  Service: Cardiovascular;  Laterality: N/A;   COLONOSCOPY  2012   Dr Lajoyce Corners.    COLONOSCOPY WITH PROPOFOL N/A 07/07/2016   Procedure: COLONOSCOPY WITH PROPOFOL;  Surgeon: [name], MD;  Location: [facility];  Service: Endoscopy;  Laterality: N/A;   CORONARY ARTERY BYPASS GRAFT N/A 01/28/2016   Procedure: CORONARY ARTERY BYPASS GRAFTING (CABG) x 3 USING RIGHT LEG GREATER SAPHENOUS VEIN GRAFT;  Surgeon: Melrose Nakayama, MD;  Location: [facility];  Service: Open Heart Surgery;  Laterality: N/A;   CYSTOSCOPY WITH RETROGRADE PYELOGRAM, URETEROSCOPY AND STENT PLACEMENT Left 05/30/2021   Procedure: CYSTOSCOPY WITH RETROGRADE PYELOGRAM, URETEROSCOPY AND STENT PLACEMENT;  Surgeon: Ceasar Mons, MD;  Location: [facility];  Service: Urology;  Laterality: Left;   ENDOVEIN HARVEST OF GREATER SAPHENOUS VEIN Right 01/28/2016   Procedure: ENDOVEIN HARVEST OF GREATER SAPHENOUS VEIN;  Surgeon: Melrose Nakayama, MD;  Location: [facility];  Service: Open Heart Surgery;  Laterality: Right;   ESOPHAGEAL BANDING  03/28/2019   Procedure: ESOPHAGEAL BANDING;  Surgeon: [name], MD;  Location: [facility];  Service: Endoscopy;;  ESOPHAGEAL BANDING  04/07/2019   Procedure: ESOPHAGEAL BANDING;  Surgeon: Juanita Craver, MD;  Location: [facility] ENDOSCOPY;  Service: Endoscopy;;   ESOPHAGOGASTRODUODENOSCOPY N/A 04/07/2019   Procedure: ESOPHAGOGASTRODUODENOSCOPY (EGD);  Surgeon: Juanita Craver, MD;  Location: [facility]COPY;  Service: Endoscopy;  Laterality: N/A;   ESOPHAGOGASTRODUODENOSCOPY (EGD) WITH PROPOFOL N/A 07/07/2016   Procedure: ESOPHAGOGASTRODUODENOSCOPY (EGD) WITH PROPOFOL;  Surgeon: [name], MD;  [facility];  Service: Endoscopy;  Laterality: N/A;   ESOPHAGOGASTRODUODENOSCOPY (EGD) WITH PROPOFOL N/A 03/28/2019    Procedure: ESOPHAGOGASTRODUODENOSCOPY (EGD) WITH PROPOFOL;  Surgeon: [name], MD;  [facility];  Service: Endoscopy;  Laterality: N/A;   HEMOSTASIS CLIP PLACEMENT  04/07/2019   Procedure: HEMOSTASIS CLIP PLACEMENT;  Surgeon: Juanita Craver, MD;  Location: [facility];  Service: Endoscopy;;   IR ANGIOGRAM SELECTIVE EACH ADDITIONAL VESSEL  04/08/2019   IR EMBO ART  VEN HEMORR LYMPH EXTRAV  INC GUIDE ROADMAPPING  04/08/2019   IR PARACENTESIS  04/08/2019   IR RADIOLOGIST EVAL & MGMT  06/13/2019   IR RADIOLOGIST EVAL & MGMT  09/25/2019   IR RADIOLOGIST EVAL & MGMT  07/07/2020   IR TIPS  04/08/2019   MAXIMUM ACCESS (MAS)POSTERIOR LUMBAR INTERBODY FUSION (PLIF) 1 LEVEL Left 06/10/2015   Procedure: FOR MAXIMUM ACCESS (MAS) POSTERIOR LUMBAR INTERBODY FUSION (PLIF) LUMBAR THREE-FOUR EXTRAFORAMINAL MICRODISCECTOMY LUMBAR FIVE-SACRAL ONE LEFT;  Surgeon: Eustace Moore, MD;  Location: [facility];  Service: Neurosurgery;  Laterality: Left;   RADIOLOGY WITH ANESTHESIA N/A 04/08/2019   Procedure: RADIOLOGY WITH ANESTHESIA;  Surgeon: Radiologist, Medication, MD;  Location: [facility];  Service: Radiology;  Laterality: N/A;   SCLEROTHERAPY  04/07/2019   Procedure: SCLEROTHERAPY;  Surgeon: Juanita Craver, MD;  Location: [facility] ENDOSCOPY;  Service: Endoscopy;;   TEE WITHOUT CARDIOVERSION N/A 01/28/2016   Procedure: TRANSESOPHAGEAL ECHOCARDIOGRAM (TEE);  Surgeon: Melrose Nakayama, MD;  Location: [facility];  Service: Open Heart Surgery;  Laterality: N/A;   TUBAL LIGATION  1982   Dr Lynwood Dawley HERNIA REPAIR N/A 09/10/2019   Procedure: HERNIA REPAIR UMBILICAL ADULT;  Surgeon: Donnie Mesa, MD;  Location: [place];  Service: General;  Laterality: N/A;   UPPER GASTROINTESTINAL ENDOSCOPY     VAGINAL HYSTERECTOMY  1997   Dr Rande Lawman    Allergies  Allergies  Allergen Reactions   Kiwi Extract Anaphylaxis   Tdap [Tetanus-Diphth-Acell Pertussis] Swelling and Other (See Comments)    Swelling at  injection site, gets very hot   Statins Other (See Comments)    RHABDOMYOLYSIS   Latex Itching, Dermatitis and Rash   Tramadol Nausea And Vomiting    History of Present Illness    [name] is a 76 y.o. female with a hx of HTN, HLD, Dm2, CAD s/p CABG, ascites/cirrhosis, bilateral carotid artery disease, atrial fibrillation I have not seen her in 3 years  Previous STEMI in 2017 followed by CABG with LIMA to LAD, SVG to IM, SVG to PDA with an LVEF at that time at 55 to 60%. She had postoperative ascites and cirrhosis with multiple paracentesis followed by GI.  She had a brief episode of PAF 08/19/19 and spontaneously converted No long term anticoagulation due to portal vein thrombosis with large varices and banding with ulcers requiring TIPS procedure by Dr [name]  Was in hospital with UTI and kidney stone 06/01/21 Had cystoscopy and stent placement with antibiotics   She helps care for her great grandson who is f<3 years old which she really enjoys.  No angina  EKGs/Labs/Other Studies Reviewed:   The following studies were reviewed today: Echo 02/2016 Study Conclusions   - Left ventricle: The cavity size was normal. There was mild    concentric hypertrophy. Systolic function was normal. The    estimated ejection fraction was in the range of 60% to 65%. Wall    motion was normal; there were no regional wall motion    abnormalities. There was an increased relative contribution of    atrial contraction to ventricular filling. Doppler parameters are    consistent with abnormal left ventricular relaxation (grade 1    diastolic dysfunction).  - Aortic valve: Trileaflet; normal thickness leaflets. Although the    mean AVG and peak velocity are consistent with very mild    stenosis, the aortic valve opens normally. There was mild    regurgitation. Peak velocity (S): 247 cm/s. Mean gradient (S): 12    mm Hg. Valve area (VTI): 1.24 cm^2. Valve area (Vmax): 1.18 cm^2.    Valve  area (Vmean): 1.12 cm^2. Regurgitation pressure half-time:    508 ms.  - Mitral valve: Calcified annulus. There was mild regurgitation.  - Left atrium: The atrium was mildly to moderately dilated.  - Pulmonary arteries: PA peak pressure: 38 mm Hg (S).  - Pericardium, extracardiac: There was a left pleural effusion.    Cardiac cath 01/2016 Prox RCA lesion, 75 %stenosed. Ramus lesion, 95 %stenosed. Prox LAD to Mid LAD lesion, 90 %stenosed. Mid RCA to Dist RCA lesion, 100 %stenosed. Dist RCA lesion, 100 %stenosed. The left ventricular ejection fraction is 45-50% by visual estimate. The left ventricular systolic function is normal. LV end diastolic pressure is mildly elevated.   Acute coronary syndrome with recent total occlusion of the dominant RCA, high-grade obstruction in the proximal LAD which Collateralizes the RCA, and high-grade obstruction and a moderate size ramus intermedius branch. Left ventricular systolic dysfunction with inferobasal moderate hypokinesis and EF of 45-50%. Upper normal LV EDP.     RECOMMENDATIONS: Long discussion with the patient concerning treatment options which include multivessel PCI / stenting vs coronary bypass grafting with LIMA to the LAD. If PCI, RCA will require near full metal jacket. After discussion, the patient would like the approach that will give her the most durable long-term success. IV nitroglycerin, IV fluid, high intensity statin therapy, aspirin therapy, and beta blocker therapy as tolerated. Expedited CABG because of presentation and critical nature of disease. EKG:  EKG is ordered today.  The ekg ordered today demonstrates NSR 66 bpm with voltage criteria for LVH. No acute ST/T wave changes.   Recent Labs: 10/13/2020: TSH 2.78 06/15/2021: ALT 33; BUN 16; Creatinine, Ser 0.71; Hemoglobin 13.1; Platelets 97.0; Potassium 3.8; Sodium 140  Recent Lipid Panel    Component Value Date/Time   CHOL 168 12/15/2020 0950   TRIG 71 12/15/2020 0950    HDL 85 12/15/2020 0950   CHOLHDL 2.0 12/15/2020 0950   CHOLHDL 2.1 02/03/2020 1009   VLDL 9 08/20/2019 0307   LDLCALC 70 12/15/2020 0950   LDLCALC 69 02/03/2020 1009    Home Medications   Current Meds  Medication Sig   acetaminophen (TYLENOL) 500 MG tablet Take 1,000 mg by mouth every 6 (six) hours.   Biotin 10000 MCG TABS Take 10,000 mcg by mouth daily.   Calcium Carb-Cholecalciferol (CALCIUM 600 + D PO) Take 1 tablet by mouth in the morning and at bedtime.   Cholecalciferol (VITAMIN D-3 PO) Take 1,000 Units by mouth daily.   Cyanocobalamin (VITAMIN B 12 PO)  Take 1,000 mcg by mouth daily.   empagliflozin (JARDIANCE) 25 MG TABS tablet Take 1 tablet (25 mg total) by mouth daily.   ezetimibe (ZETIA) 10 MG tablet Take 1 tablet (10 mg total) by mouth daily.   furosemide (LASIX) 20 MG tablet TAKE 1 TABLET(20 MG) BY MOUTH DAILY   glucose blood test strip One Touch Ultra strips. Use to test blood sugar three times daily. Dx: E11.65   insulin detemir (LEVEMIR FLEXTOUCH) 100 UNIT/ML FlexPen Inject 25 Units into the skin at bedtime.   Insulin Pen Needle (BD PEN NEEDLE NANO U/F) 32G X 4 MM MISC USE THREE TIMES DAILY AS DIRECTED   Insulin Syringe-Needle U-100 (INSULIN SYRINGE 1CC/31GX5/16") 31G X 5/16" 1 ML MISC USE AS DIRECTED TO INJECT LEVIMIR   lactulose (CHRONULAC) 10 GM/15ML solution Take 30 mLs (20 g total) by mouth daily.   LORazepam (ATIVAN) 0.5 MG tablet Take 1 tablet (0.5 mg total) by mouth 2 (two) times daily as needed for anxiety.   MAGNESIUM PO Take 500 mg by mouth daily.    metoprolol tartrate (LOPRESSOR) 25 MG tablet Take 1 tablet (25 mg total) by mouth as needed (use as needed for HR greater than 110).   Multiple Vitamins-Minerals (MULTIVITAMIN WITH MINERALS) tablet Take 1 tablet by mouth daily.   Omega-3 Fatty Acids (FISH OIL PO) Take 1,000 mg by mouth daily.   pantoprazole (PROTONIX) 40 MG tablet TAKE 1 TABLET(40 MG) BY MOUTH DAILY   Polyethyl Glycol-Propyl Glycol (SYSTANE OP)  Place 1 drop into both eyes at bedtime.   Probiotic Product (PROBIOTIC DAILY PO) Take 1 capsule by mouth daily. Digestive Advantage Probiotic   XIFAXAN 550 MG TABS tablet Take 1 tablet (550 mg total) by mouth 2 (two) times daily.   Review of Systems      All other systems reviewed and are otherwise negative except as noted above.  Physical Exam    VS:  BP (!) 122/52    Pulse 67    Ht 5' 3"  (1.6 m)    Wt 126 lb (57.2 kg)    LMP  (LMP Unknown)    SpO2 98%    BMI 22.32 kg/m  , BMI Body mass index is 22.32 kg/m.  Wt Readings from Last 3 Encounters:  07/16/21 126 lb (57.2 kg)  06/22/21 125 lb 6.4 oz (56.9 kg)  06/08/21 125 lb 12.8 oz (57.1 kg)     GEN: Well nourished, well developed, in no acute distress. HEENT: normal. Neck: Supple, no JVD, carotid bruits, or masses. Cardiac: RRR, no murmurs, rubs, or gallops. No clubbing, cyanosis.  RLE with 1 + pitting edema. Radials/PT 2+ and equal bilaterally.  Respiratory:  Respirations regular and unlabored, clear to auscultation bilaterally. GI: Soft, nontender, nondistended. MS: No deformity or atrophy. Skin: Warm and dry, no rash. Neuro:  Strength and sensation are intact. Psych: Normal affect.  Assessment & Plan     PAF - Initially found at urgent care with EKG showing AF with RVR and spontaneously converted to NSR. CHADS2VASc 6. No anticoagulation due to history of portal vein thrombosis and large vaices with prior bleeding. Echo 08/2019 normal LVEF, gr2DD. Continue Metoprolol 99m PRN.   HLD- Continue to follow with PCP. Continue Zetia 163mdaily.   CAD s/p CABG - Stable with no anginal symptoms. No indication for ischemic evaluation. Heart healthy diet and regular cardiovascular exercise encouraged.  Continue Zetia. No aspirin given bleeding history. No statin due to history of rhabdo.  GERD - Continue Protonix.  Grade 2 diastolic dysfunction - mild RLE edeam on lasix stable   Renal:  UTI with stone and uretal stent placement f/u  urology ? Repeat cycstoscopy with Dr Dianna Rossetti 3/23 for retrieval   Cirrhosis:  f/u [name] on lactulose and Xifaxan  MRI liver 06/15/21 with patent TIPS shunt and no hepatoma   Disposition: Follow up in a year   Signed, [name], MD 07/16/2021, 9:25 AM [place]

## 2021-07-12 ENCOUNTER — Encounter: Payer: Self-pay | Admitting: Gastroenterology

## 2021-07-14 ENCOUNTER — Telehealth: Payer: Self-pay | Admitting: Gastroenterology

## 2021-07-14 NOTE — Telephone Encounter (Signed)
Patient called to follow up on previous message. Please advise when possible. ?

## 2021-07-14 NOTE — Telephone Encounter (Signed)
Patient called stating her Doreene Nest has been denied by her insurance and/or assistance program.  She wants to know if Dr. [name] wants her to continue to take it.  She said with her insurance her copay was $900 which she could not afford to pay.  Are there alternatives?  Please call and advise patient.  Thank you. ?

## 2021-07-15 NOTE — Telephone Encounter (Signed)
Patient aware that patient assistance for Xifaxan has been denied due to her having insurance with available prescription coverage. ? ?A prior authorization was obtained for Xifaxan.  The cost for a 30 day supply with prior auth would be 1143.50. ? ?An appeal letter was sent to [facility] Patient Assistance for further review.  Patient advised that we will make her aware once a decision has been made.  Patient agreed to plan and verbalized understanding.  No further questions.   ?

## 2021-07-16 ENCOUNTER — Ambulatory Visit (INDEPENDENT_AMBULATORY_CARE_PROVIDER_SITE_OTHER): Payer: Medicare Other | Admitting: Cardiovascular Disease

## 2021-07-16 ENCOUNTER — Encounter: Payer: Self-pay | Admitting: Cardiovascular Disease

## 2021-07-16 ENCOUNTER — Other Ambulatory Visit: Payer: Self-pay

## 2021-07-16 VITALS — BP 122/52 | HR 67 | Ht 63.0 in | Wt 126.0 lb

## 2021-07-16 DIAGNOSIS — Z951 Presence of aortocoronary bypass graft: Secondary | ICD-10-CM

## 2021-07-16 DIAGNOSIS — I81 Portal vein thrombosis: Secondary | ICD-10-CM | POA: Diagnosis not present

## 2021-07-16 DIAGNOSIS — E782 Mixed hyperlipidemia: Secondary | ICD-10-CM | POA: Diagnosis not present

## 2021-07-16 DIAGNOSIS — I480 Paroxysmal atrial fibrillation: Secondary | ICD-10-CM

## 2021-07-16 NOTE — Patient Instructions (Signed)
Medication Instructions:  ?*If you need a refill on your cardiac medications before your next appointment, please call your pharmacy* ? ?Lab Work: ?If you have labs (blood work) drawn today and your tests are completely normal, you will receive your results only by: ?MyChart Message (if you have MyChart) OR ?A paper copy in the mail ?If you have any lab test that is abnormal or we need to change your treatment, we will call you to review the results. ? ?Testing/Procedures: ?None ordered ? ?Follow-Up: ?At [facility], you and your health needs are our priority.  As part of our continuing mission to provide you with exceptional heart care, we have created designated Provider Care Teams.  These Care Teams include your primary Cardiologist (physician) and Advanced Practice Providers (APPs -  Physician Assistants and Nurse Practitioners) who all work together to provide you with the care you need, when you need it. ? ?We recommend signing up for the patient portal called "MyChart".  Sign up information is provided on this After Visit Summary.  MyChart is used to connect with patients for Virtual Visits (Telemedicine).  Patients are able to view lab/test results, encounter notes, upcoming appointments, etc.  Non-urgent messages can be sent to your provider as well.   ?To learn more about what you can do with MyChart, go to [email or web address].   ? ?Your next appointment:   ?1 year(s) ? ?The format for your next appointment:   ?In Person ? ?Provider:   ?[name], MD { ? ?

## 2021-07-20 ENCOUNTER — Encounter (HOSPITAL_BASED_OUTPATIENT_CLINIC_OR_DEPARTMENT_OTHER): Payer: Self-pay | Admitting: Urology

## 2021-07-21 ENCOUNTER — Other Ambulatory Visit: Payer: Self-pay

## 2021-07-21 ENCOUNTER — Encounter (HOSPITAL_BASED_OUTPATIENT_CLINIC_OR_DEPARTMENT_OTHER): Payer: Self-pay | Admitting: Urology

## 2021-07-21 NOTE — Telephone Encounter (Signed)
Patient aware that per DeShaun at [facility] Patient Assistance program, Doreene Nest was approved until 05-08-2022.  DeShaun stated that the patient should receive her medication in the next 7-10 days.  Patient agreed to plan and verbalized understanding.  No furthrer questions. ?

## 2021-07-21 NOTE — Telephone Encounter (Signed)
Patient aware that per DeShaun at [facility] Patient Assistance program, Doreene Nest was approved until 05-08-2022.  DeShaun stated that the patient should receive her medication in the next 7-10 days.  Patient agreed to plan and verbalized understanding.  No furthrer questions. ?

## 2021-07-21 NOTE — Telephone Encounter (Signed)
Patient called to follow up on medication status for the Xifaxan. ?

## 2021-07-21 NOTE — Progress Notes (Signed)
Spoke w/ via phone for pre-op interview--- pt ?Lab needs dos----   istat            ?Lab results------ current ekg in epic/ chart ?COVID test -----patient states asymptomatic no test needed ?Arrive at ------- 1200  on 07-26-2021 ?NPO after MN NO Solid Food.  Clear liquids from MN until--- 1100 ?Med rec completed ?Medications to take morning of surgery ----- protonix, take lopressor if needed as directed for heart rate >110, if can take without solid food take xifaxan ?Diabetic medication ----- do not take jardiance morning of surgery and do half dose levemir insulin night before surgery ?Patient instructed no nail polish to be worn day of surgery ?Patient instructed to bring photo id and insurance card day of surgery ?Patient aware to have Driver (ride ) / caregiver  for 24 hours after surgery --husband, [name] ?Patient Special Instructions ----- n/a ?Pre-Op special Istructions ----- n/a ?Patient verbalized understanding of instructions that were given at this phone interview. ?Patient denies shortness of breath, chest pain, fever, cough at this phone interview.  ? ? ?Anesthesia Review:  HTN;  hx NSTEMI / CAD  s/p CABG x3 09/ 2017;  chronic diastolic CHF;  PAF;  Cirrhosis without ascites due to NAFLD s/p TIPS shunt 11/ 2020;  chronic nonoccluded PV thrombosis ?large esophageal varices s/p multiple banding's (pt stated no issues w/ dysphasia);  ? CKD 1;  DM2;  chronic mild thrombocytopenia ? ?Pt denied cardiac s&s, sob w/ long distance walking / stairs but recovers quickly, and right lower extremity edema. ? ?Chart reviewed by Dr Lissa Hoard MDA , stated ok to proceed at [facility]. ? ? ?PCP:  Dr Ammie Ferrier ?Cardiologist : Dr Johnsie Cancel (lov 07-16-2021 epic) ?GI:  Dr [name] (lov 12-16-2020 epic) ?Chest x-ray : 11-03.-2022 ?EKG : 03-11-2021 ?Echo : 08-20-2019 ?Stress test: no ?Cardiac Cath :  01-27-2016 epic ?Activity level:  see above ?Sleep Study/ CPAP : ?Fasting Blood Sugar :  113--200s    / Checks Blood Sugar -- times a day:   daily am ?Blood Thinner/ Instructions /Last Dose: no ?ASA / Instructions/ Last Dose :  no ?

## 2021-07-26 ENCOUNTER — Ambulatory Visit (HOSPITAL_BASED_OUTPATIENT_CLINIC_OR_DEPARTMENT_OTHER): Payer: Medicare Other | Admitting: Anesthesiology

## 2021-07-26 ENCOUNTER — Encounter (HOSPITAL_BASED_OUTPATIENT_CLINIC_OR_DEPARTMENT_OTHER)
Admission: RE | Disposition: A | Discharge status: Home / Self Care | Payer: Self-pay | Source: Home / Self Care | Attending: Urology

## 2021-07-26 ENCOUNTER — Ambulatory Visit (HOSPITAL_BASED_OUTPATIENT_CLINIC_OR_DEPARTMENT_OTHER)
Admission: RE | Admit: 2021-07-26 | Discharge: 2021-07-26 | Disposition: A | Discharge status: Home / Self Care | Payer: Medicare Other | Attending: Urology | Admitting: Urology

## 2021-07-26 ENCOUNTER — Encounter (HOSPITAL_BASED_OUTPATIENT_CLINIC_OR_DEPARTMENT_OTHER): Payer: Self-pay | Admitting: Urology

## 2021-07-26 DIAGNOSIS — E119 Type 2 diabetes mellitus without complications: Secondary | ICD-10-CM | POA: Diagnosis not present

## 2021-07-26 DIAGNOSIS — K7460 Unspecified cirrhosis of liver: Secondary | ICD-10-CM | POA: Insufficient documentation

## 2021-07-26 DIAGNOSIS — I5032 Chronic diastolic (congestive) heart failure: Secondary | ICD-10-CM | POA: Diagnosis not present

## 2021-07-26 DIAGNOSIS — N1330 Unspecified hydronephrosis: Secondary | ICD-10-CM | POA: Diagnosis not present

## 2021-07-26 DIAGNOSIS — E1122 Type 2 diabetes mellitus with diabetic chronic kidney disease: Secondary | ICD-10-CM | POA: Insufficient documentation

## 2021-07-26 DIAGNOSIS — Z794 Long term (current) use of insulin: Secondary | ICD-10-CM | POA: Diagnosis not present

## 2021-07-26 DIAGNOSIS — K219 Gastro-esophageal reflux disease without esophagitis: Secondary | ICD-10-CM | POA: Insufficient documentation

## 2021-07-26 DIAGNOSIS — Z951 Presence of aortocoronary bypass graft: Secondary | ICD-10-CM | POA: Insufficient documentation

## 2021-07-26 DIAGNOSIS — N201 Calculus of ureter: Secondary | ICD-10-CM

## 2021-07-26 DIAGNOSIS — I10 Essential (primary) hypertension: Secondary | ICD-10-CM

## 2021-07-26 DIAGNOSIS — N181 Chronic kidney disease, stage 1: Secondary | ICD-10-CM | POA: Diagnosis not present

## 2021-07-26 DIAGNOSIS — I130 Hypertensive heart and chronic kidney disease with heart failure and stage 1 through stage 4 chronic kidney disease, or unspecified chronic kidney disease: Secondary | ICD-10-CM | POA: Insufficient documentation

## 2021-07-26 DIAGNOSIS — I2510 Atherosclerotic heart disease of native coronary artery without angina pectoris: Secondary | ICD-10-CM | POA: Diagnosis not present

## 2021-07-26 DIAGNOSIS — I4891 Unspecified atrial fibrillation: Secondary | ICD-10-CM | POA: Diagnosis not present

## 2021-07-26 HISTORY — DX: Personal history of urinary calculi: Z87.442

## 2021-07-26 HISTORY — DX: Essential (primary) hypertension: I10

## 2021-07-26 HISTORY — DX: Paroxysmal atrial fibrillation: I48.0

## 2021-07-26 HISTORY — PX: CYSTOSCOPY/URETEROSCOPY/HOLMIUM LASER/STENT PLACEMENT: SHX6546

## 2021-07-26 HISTORY — DX: Other cervical disc degeneration, unspecified cervical region: M50.30

## 2021-07-26 HISTORY — DX: Esophageal varices without bleeding: I85.00

## 2021-07-26 HISTORY — DX: Hyperlipidemia, unspecified: E78.5

## 2021-07-26 HISTORY — DX: Unspecified osteoarthritis, unspecified site: M19.90

## 2021-07-26 HISTORY — DX: Presence of external hearing-aid: Z97.4

## 2021-07-26 HISTORY — DX: Long term (current) use of insulin: Z79.4

## 2021-07-26 HISTORY — DX: Localized edema: R60.0

## 2021-07-26 HISTORY — DX: Chronic diastolic (congestive) heart failure: I50.32

## 2021-07-26 HISTORY — DX: Urge incontinence: N39.41

## 2021-07-26 HISTORY — DX: Other chronic cystitis without hematuria: N30.20

## 2021-07-26 HISTORY — DX: Fatty (change of) liver, not elsewhere classified: K76.0

## 2021-07-26 HISTORY — DX: Personal history of adenomatous and serrated colon polyps: Z86.0101

## 2021-07-26 HISTORY — DX: Portal vein thrombosis: I81

## 2021-07-26 HISTORY — DX: Thrombocytopenia, unspecified: D69.6

## 2021-07-26 HISTORY — DX: Type 2 diabetes mellitus without complications: E11.9

## 2021-07-26 HISTORY — DX: Personal history of colon polyps: Z86.010

## 2021-07-26 LAB — POCT I-STAT, CHEM 8
BUN: 15 mg/dL (ref 8–23)
Calcium, Ion: 1.28 mmol/L (ref 1.15–1.40)
Chloride: 107 mmol/L (ref 98–111)
Creatinine, Ser: 0.5 mg/dL (ref 0.44–1.00)
Glucose, Bld: 126 mg/dL — ABNORMAL HIGH (ref 70–99)
HCT: 40 % (ref 36.0–46.0)
Hemoglobin: 13.6 g/dL (ref 12.0–15.0)
Potassium: 3.6 mmol/L (ref 3.5–5.1)
Sodium: 145 mmol/L (ref 135–145)
TCO2: 25 mmol/L (ref 22–32)

## 2021-07-26 LAB — GLUCOSE, CAPILLARY: Glucose-Capillary: 130 mg/dL — ABNORMAL HIGH (ref 70–99)

## 2021-07-26 SURGERY — CYSTOSCOPY/URETEROSCOPY/HOLMIUM LASER/STENT PLACEMENT
Anesthesia: General | Site: Renal | Laterality: Left

## 2021-07-26 MED ORDER — LACTATED RINGERS IV SOLN: INTRAVENOUS | Status: DC | PRN

## 2021-07-26 MED ORDER — 0.9 % SODIUM CHLORIDE (POUR BTL) OPTIME
TOPICAL | Status: DC | PRN
  Administered 2021-07-26: 500 mL

## 2021-07-26 MED ORDER — CEFAZOLIN SODIUM-DEXTROSE 2-4 GM/100ML-% IV SOLN
INTRAVENOUS | Status: AC
  Filled 2021-07-26: qty 100

## 2021-07-26 MED ORDER — CEFAZOLIN SODIUM-DEXTROSE 2-4 GM/100ML-% IV SOLN: 2.0000 g | Freq: Once | INTRAVENOUS | Status: DC

## 2021-07-26 MED ORDER — FENTANYL CITRATE (PF) 100 MCG/2ML IJ SOLN
INTRAMUSCULAR | Status: AC
  Filled 2021-07-26: qty 2

## 2021-07-26 MED ORDER — SODIUM CHLORIDE 0.9 % IR SOLN
Status: DC | PRN
Start: 2021-07-26 — End: 2021-07-26
  Administered 2021-07-26: 1500 mL

## 2021-07-26 MED ORDER — ONDANSETRON HCL 4 MG/2ML IJ SOLN
INTRAMUSCULAR | Status: DC | PRN
  Administered 2021-07-26: 4 mg via INTRAVENOUS

## 2021-07-26 MED ORDER — DOCUSATE SODIUM 100 MG PO CAPS: 100.0000 mg | ORAL_CAPSULE | Freq: Every day | ORAL | 0 refills | Status: DC | PRN

## 2021-07-26 MED ORDER — PROPOFOL 10 MG/ML IV BOLUS
INTRAVENOUS | Status: DC | PRN
  Administered 2021-07-26: 120 mg via INTRAVENOUS

## 2021-07-26 MED ORDER — FENTANYL CITRATE (PF) 250 MCG/5ML IJ SOLN
INTRAMUSCULAR | Status: DC | PRN
  Administered 2021-07-26: 50 ug via INTRAVENOUS

## 2021-07-26 MED ORDER — OXYCODONE-ACETAMINOPHEN 5-325 MG PO TABS: 1.0000 | ORAL_TABLET | ORAL | 0 refills | Status: DC | PRN

## 2021-07-26 MED ORDER — LACTATED RINGERS IV SOLN: INTRAVENOUS | Status: DC

## 2021-07-26 MED ORDER — LIDOCAINE HCL (PF) 2 % IJ SOLN
INTRAMUSCULAR | Status: AC
Start: 2021-07-26 — End: ?
  Filled 2021-07-26: qty 15

## 2021-07-26 MED ORDER — EPHEDRINE SULFATE-NACL 50-0.9 MG/10ML-% IV SOSY
PREFILLED_SYRINGE | INTRAVENOUS | Status: DC | PRN
  Administered 2021-07-26 (×2): 5 mg via INTRAVENOUS

## 2021-07-26 MED ORDER — PHENYLEPHRINE HCL-NACL 20-0.9 MG/250ML-% IV SOLN
INTRAVENOUS | Status: DC | PRN
  Administered 2021-07-26: 40 ug via INTRAVENOUS

## 2021-07-26 MED ORDER — IOHEXOL 300 MG/ML  SOLN
INTRAMUSCULAR | Status: DC | PRN
  Administered 2021-07-26: 5 mL via URETHRAL

## 2021-07-26 MED ORDER — ACETAMINOPHEN 500 MG PO TABS
ORAL_TABLET | ORAL | Status: AC
  Filled 2021-07-26: qty 2

## 2021-07-26 MED ORDER — ACETAMINOPHEN 500 MG PO TABS
1000.0000 mg | ORAL_TABLET | Freq: Once | ORAL | Status: AC
  Administered 2021-07-26: 1000 mg via ORAL

## 2021-07-26 MED ORDER — CEPHALEXIN 500 MG PO CAPS
500.0000 mg | ORAL_CAPSULE | Freq: Two times a day (BID) | ORAL | 0 refills | Status: AC
Start: 2021-07-26 — End: 2021-07-29

## 2021-07-26 MED ORDER — LIDOCAINE 2% (20 MG/ML) 5 ML SYRINGE
INTRAMUSCULAR | Status: DC | PRN
  Administered 2021-07-26: 40 mg via INTRAVENOUS

## 2021-07-26 MED ORDER — DEXAMETHASONE SODIUM PHOSPHATE 10 MG/ML IJ SOLN
INTRAMUSCULAR | Status: DC | PRN
  Administered 2021-07-26: 5 mg via INTRAVENOUS

## 2021-07-26 SURGICAL SUPPLY — 26 items
BAG DRAIN URO-CYSTO SKYTR STRL (DRAIN) ×2 IMPLANT
BASKET ZERO TIP NITINOL 2.4FR (BASKET) ×1 IMPLANT
BENZOIN TINCTURE PRP APPL 2/3 (GAUZE/BANDAGES/DRESSINGS) ×1 IMPLANT
CATH SET URETHRAL DILATOR (CATHETERS) IMPLANT
CATH URET 5FR 28IN OPEN ENDED (CATHETERS) ×2 IMPLANT
CLOTH BEACON ORANGE TIMEOUT ST (SAFETY) ×2 IMPLANT
DRSG TEGADERM 2-3/8X2-3/4 SM (GAUZE/BANDAGES/DRESSINGS) ×1 IMPLANT
FIBER LASER FLEXIVA 365 (UROLOGICAL SUPPLIES) IMPLANT
GLOVE SURG ENC MOIS LTX SZ7 (GLOVE) ×2 IMPLANT
GOWN STRL REUS W/TWL LRG LVL3 (GOWN DISPOSABLE) ×2 IMPLANT
GUIDEWIRE STR DUAL SENSOR (WIRE) ×2 IMPLANT
GUIDEWIRE ZIPWRE .038 STRAIGHT (WIRE) ×1 IMPLANT
IV NS 1000ML (IV SOLUTION)
IV NS 1000ML BAXH (IV SOLUTION) ×1 IMPLANT
IV NS IRRIG 3000ML ARTHROMATIC (IV SOLUTION) ×2 IMPLANT
KIT TURNOVER CYSTO (KITS) ×2 IMPLANT
MANIFOLD NEPTUNE II (INSTRUMENTS) ×2 IMPLANT
NS IRRIG 500ML POUR BTL (IV SOLUTION) ×2 IMPLANT
PACK CYSTO (CUSTOM PROCEDURE TRAY) ×2 IMPLANT
STENT URET 6FRX24 CONTOUR (STENTS) ×1 IMPLANT
SYR 10ML LL (SYRINGE) ×2 IMPLANT
TRACTIP FLEXIVA PULS ID 200XHI (Laser) IMPLANT
TRACTIP FLEXIVA PULSE ID 200 (Laser) ×2
TUBE CONNECTING 12X1/4 (SUCTIONS) ×2 IMPLANT
TUBE FEEDING 8FR 16IN STR KANG (MISCELLANEOUS) IMPLANT
TUBING UROLOGY SET (TUBING) ×2 IMPLANT

## 2021-07-26 NOTE — Anesthesia Postprocedure Evaluation (Signed)
Anesthesia Post Note ? ?Patient: [name] ? ?Procedure(s) Performed: CYSTOSCOPY/RETROGRADE/URETEROSCOPY/HOLMIUM LASER/STENT EXCHANGE (Left: Renal) ? ?  ? ?Patient location during evaluation: Phase II ?Anesthesia Type: General ?Level of consciousness: awake and alert, oriented and patient cooperative ?Pain management: pain level controlled ?Vital Signs Assessment: post-procedure vital signs reviewed and stable ?Respiratory status: spontaneous breathing, nonlabored ventilation and respiratory function stable ?Cardiovascular status: blood pressure returned to baseline and stable ?Postop Assessment: no apparent nausea or vomiting, adequate PO intake and able to ambulate ?Anesthetic complications: no ? ? ?No notable events documented. ? ?Last Vitals:  ?Vitals:  ? 07/26/21 1510 07/26/21 1541  ?BP: (!) 134/58 126/63  ?Pulse: 69 76  ?Resp: 10 14  ?Temp: (!) 36.3 ?C (!) 36.3 ?C  ?SpO2: 96% 98%  ?  ?Last Pain:  ?Vitals:  ? 07/26/21 1541  ?TempSrc:   ?PainSc: 0-No pain  ? ? ?  ?  ?  ?  ?  ?  ? ?Sevana Grandinetti,E. Aadon Gorelik ? ? ? ? ?

## 2021-07-26 NOTE — Anesthesia Preprocedure Evaluation (Addendum)
Anesthesia Evaluation  ?Patient identified by MRN, date of birth, ID band ?Patient awake ? ? ? ?Reviewed: ?Allergy & Precautions, NPO status , Patient's Chart, lab work & pertinent test results, reviewed documented beta blocker date and time  ? ?History of Anesthesia Complications ?Negative for: history of anesthetic complications ? ?Airway ?Mallampati: I ? ?TM Distance: >3 FB ?Neck ROM: Full ? ? ? Dental ? ?(+) Dental Advisory Given, Caps ?  ?Pulmonary ?neg pulmonary ROS,  ?  ?breath sounds clear to auscultation ? ? ? ? ? ? Cardiovascular ?hypertension, Pt. on medications ?+ CAD and + CABG  ?+ dysrhythmias (metoprolol prn tachycardia) Atrial Fibrillation  ?Rhythm:Regular Rate:Normal ? ?'21 ECHO: 60 to 65%. LV has normal function, no regional wall motion abnormalities. Grade II DD. Normal RVF, mild MR, mild-mod TR ?  ?Neuro/Psych ?negative neurological ROS ? negative psych ROS  ? GI/Hepatic ?GERD  Controlled and Medicated,(+) Cirrhosis  ? Esophageal Varices and ascites ?  ? ,   ?Endo/Other  ?diabetes, Insulin Dependent ? Renal/GU ?Renal InsufficiencyRenal disease  ? ?  ?Musculoskeletal ? ?(+) Arthritis , Osteoarthritis,   ? Abdominal ?  ?Peds ? Hematology ?H/o thrombocytopenia: plt 97k 06/2021   ?Anesthesia Other Findings ? ? Reproductive/Obstetrics ? ?  ? ? ? ? ? ? ? ? ? ? ? ? ? ?  ?  ? ? ? ? ? ? ? ?Anesthesia Physical ?Anesthesia Plan ? ?ASA: 3 ? ?Anesthesia Plan: General  ? ?Post-op Pain Management: Tylenol PO (pre-op)*  ? ?Induction: Intravenous ? ?PONV Risk Score and Plan: 3 and Ondansetron, Dexamethasone and Treatment may vary due to age or medical condition ? ?Airway Management Planned: LMA ? ?Additional Equipment: None ? ?Intra-op Plan:  ? ?Post-operative Plan:  ? ?Informed Consent: I have reviewed the patients History and Physical, chart, labs and discussed the procedure including the risks, benefits and alternatives for the proposed anesthesia with the patient or authorized  representative who has indicated his/her understanding and acceptance.  ? ? ? ?Dental advisory given ? ?Plan Discussed with: CRNA and Surgeon ? ?Anesthesia Plan Comments:   ? ? ? ? ? ?Anesthesia Quick Evaluation ? ?

## 2021-07-26 NOTE — Transfer of Care (Signed)
Immediate Anesthesia Transfer of Care Note ? ?Patient: [name] ? ?Procedure(s) Performed: CYSTOSCOPY/RETROGRADE/URETEROSCOPY/HOLMIUM LASER/STENT PLACEMENT (Left: Renal) ? ?Patient Location: PACU ? ?Anesthesia Type:General ? ?Level of Consciousness: awake, alert  and patient cooperative ? ?Airway & Oxygen Therapy: Patient Spontanous Breathing and Patient connected to nasal cannula oxygen ? ?Post-op Assessment: Report given to RN and Post -op Vital signs reviewed and stable ? ?Post vital signs: Reviewed and stable ? ?Last Vitals:  ?Vitals Value Taken Time  ?BP    ?Temp    ?Pulse 88 07/26/21 1442  ?Resp 18 07/26/21 1442  ?SpO2 94 % 07/26/21 1442  ?Vitals shown include unvalidated device data. ? ?Last Pain:  ?Vitals:  ? 07/26/21 1246  ?TempSrc: Oral  ?PainSc: 0-No pain  ?   ? ?Patients Stated Pain Goal: 5 (07/26/21 1246) ? ?Complications: No notable events documented. ?

## 2021-07-26 NOTE — Anesthesia Procedure Notes (Signed)
Procedure Name: LMA Insertion ?Date/Time: 07/26/2021 1:49 PM ?Performed by: Georgeanne Nim, CRNA ?Pre-anesthesia Checklist: Patient identified, Emergency Drugs available, Suction available, Patient being monitored and Timeout performed ?Patient Re-evaluated:Patient Re-evaluated prior to induction ?Oxygen Delivery Method: Circle system utilized ?Preoxygenation: Pre-oxygenation with 100% oxygen ?Induction Type: IV induction ?Ventilation: Mask ventilation without difficulty ?LMA: LMA inserted ?LMA Size: 3.0 ?Number of attempts: 1 ?Placement Confirmation: positive ETCO2, CO2 detector and breath sounds checked- equal and bilateral ?Tube secured with: Tape ?Dental Injury: Teeth and Oropharynx as per pre-operative assessment  ? ? ? ? ?

## 2021-07-26 NOTE — Op Note (Signed)
Operative Note ? ?Preoperative diagnosis:  ?1.  Left proximal ureteral  stone ? ?Postoperative diagnosis: ?1.  Left proximal ureteral stone ? ?Procedure(s): ?1.  Cystoscopy ?2. Left ureteroscopy with laser lithotripsy and basket extraction of stones ?3. Left retrograde pyelogram ?4. Left ureteral stent exchange ?5. Fluoroscopy with intraoperative interpretation ? ?Surgeon: Rexene Alberts, MD ? ?Assistants:  None ? ?Anesthesia:  General ? ?Complications:  None ? ?EBL:  Minimal ? ?Specimens: ?1. Stones for stone analysis (to be done at [facility]) ? ?Drains/Catheters: ?1.  Left 6Fr x 24cm ureteral stent WITH a tether string ? ?Intraoperative findings:   ?Cystoscopy demonstrated no suspicious bladder lesions. ?Left ureteroscopy demonstrated approximately 2m left proximal ureteral stone ?Left retrograde pyelogram demonstrated moderate left hydronephrosis. ?Left ureteral stent placement. ? ?Indication:  Kayla Behannais a 76y.o. Conway with a left ureteral stone here for definitive stone treatment. ? ?Description of procedure: ?After informed consent was obtained from the patient, the patient was identified and taken to the operating room and placed in the supine position.  General anesthesia was administered as well as perioperative IV antibiotics.  At the beginning of the case, a time-out was performed to properly identify the patient, the surgery to be performed, and the surgical site.  Sequential compression devices were applied to the lower extremities at the beginning of the case for DVT prophylaxis.  The patient was then placed in the dorsal lithotomy supine position, prepped and draped in sterile fashion. ? ?Preliminary scout fluoroscopy revealed that there was a 729mcalcification area at the left proximal ureter, which corresponds to the stone found on the preoperative CT scan. We then passed the 21-French rigid cystoscope through the urethra and into the bladder under vision without any  difficulty.  A systematic evaluation of the bladder revealed no evidence of any suspicious bladder lesions.  Ureteral orifices were in normal position.   ? ?The distal aspect of the ureteral stent was seen protruding from the left ureteral orifice.  We then used the alligator-tooth forceps and grasped the distal end of the ureteral stent and brought it out the urethral meatus while watching the proximal coil straighten out nicely on fluoroscopy. Through the ureteral stent, we then passed a 0.038 sensor wire up to the level of the renal pelvis.  The ureteral stent was then removed, leaving the sensor wire up the left ureter.   ? ?Under cystoscopic and flouroscopic guidance, we cannulated the left ureteral orifice with a 5-French open-ended ureteral catheter and a gentle retrograde pyelogram was performed, revealing a normal caliber ureter without any filling defects. There was moderate left hydronephrosis of the collecting system. There was a 62m72milling defect in the left proximal ureter corresponding to the stone. A 0.038 sensor wire was then passed up to the level of the renal pelvis and secured to the drape as a safety wire. The ureteral catheter and cystoscope were removed, leaving the safety wire in place.  ? ?A semi-rigid ureteroscope was passed alongside the wire up the distal ureter which appeared normal. The flexible ureteroscope was advanced into the collecting system. The calculus was identified at the left proximal ureter. Using the 242 micron holmium laser fiber, the stone was fragmented into two pieces. A 2.2 Fr zero tip basket was used to remove the fragments under visual guidance. These were sent for chemical analysis. With the ureteroscope in the kidney, a gentle pyelogram was performed to delineate the calyceal system and we evaluated the calyces systematically. We encountered no  further stones. The rest of the stone fragments were very tiny and these were  irrigated away gently. The calyces were  re-inspected and there were no significant stone fragment residual.  ? ?We then withdrew the ureteroscope back down the ureter noting no evidence of any stones along the course of the ureter.  Prior to removing the ureteroscope, we did pass the Glidewire back up to the ureter to the renal pelvis.  Once the ureteroscope was removed, we then used the Glidewire under fluoroscopic guidance and passed up a 6-French x 24 cm double-pigtail ureteral stent up the ureter, making sure that the proximal and distal ends coiled within the kidney and bladder respectively. ? ?Note that we left a long tether string attached to the distal end of the ureteral stent and it exited the urethral meatus and was secured to the inner thigh with a tegaderm adhesive.  The cystoscope was then advanced back into the bladder under vision.  We were able to see the distal stent coiling nicely within the bladder.  The bladder was then emptied with irrigation solution.  The cystoscope was then removed.   ? ?The patient tolerated the procedure well and there was no complication. Patient was awoken from anesthesia and taken to the recovery room in stable condition. I was present and scrubbed for the entirety of the case. ? ?Plan:  Patient will be discharged home.  She will remove her stent in 3 days. She will f/u with me in 1 mo with renal ultrasound. ? ? ?Kayla R. Inza Mikrut MD ?[facility]  ?Pager: [phone] ? ?

## 2021-07-26 NOTE — Discharge Instructions (Addendum)
[facility] ?[phone] ?Post Ureteroscopy With or Without Stent Instructions ? ?Definitions: ? ?Ureter: The duct that transports urine from the kidney to the bladder. ?Stent:   A plastic hollow tube that is placed into the ureter, from the kidney to the bladder to prevent the ureter from swelling shut. ? ?GENERAL INSTRUCTIONS: ? ?Despite the fact that no skin incisions were used, the area around the ureter and bladder is raw and irritated. The stent is a foreign body which will further irritate the bladder wall. This irritation is manifested by increased frequency of urination, both day and night, and by an increase in the urge to urinate. In some, the urge to urinate is present almost always. Sometimes the urge is strong enough that you may not be able to stop yourself from urinating. The only real cure is to remove the stent and then give time for the bladder wall to heal which can't be done until the danger of the ureter swelling shut has passed, which varies. ? ?You may see some blood in your urine while the stent is in place and a few days afterwards. Do not be alarmed, even if the urine was clear for a while. Get off your feet and drink lots of fluids until clearing occurs. If you start to pass clots or don't improve, call us. ? ?DIET: ?You may return to your normal diet immediately. Because of the raw surface of your bladder, alcohol, spicy foods, acid type foods and drinks with caffeine may cause irritation or frequency and should be used in moderation. To keep your urine flowing freely and to avoid constipation, drink plenty of fluids during the day ( 8-10 glasses ). ?Tip: Avoid cranberry juice because it is very acidic. ? ?ACTIVITY: ?Your physical activity doesn't need to be restricted. However, if you are very active, you may see some blood in your urine. We suggest that you reduce your activity under these circumstances until the bleeding has stopped. ? ?BOWELS: ?It is important to  keep your bowels regular during the postoperative period. Straining with bowel movements can cause bleeding. A bowel movement every other day is reasonable. Use a mild laxative if needed, such as Milk of Magnesia 2-3 tablespoons, or 2 Dulcolax tablets. Call if you continue to have problems. If you have been taking narcotics for pain, before, during or after your surgery, you may be constipated. Take a laxative if necessary. ? ? ?MEDICATION: ?You should resume your pre-surgery medications unless told not to. In addition you will often be given an antibiotic to prevent infection. These should be taken as prescribed until the bottles are finished unless you are having an unusual reaction to one of the drugs. ? ?PROBLEMS YOU SHOULD REPORT TO [place]: ?Fevers over 100.5 Fahrenheit. ?Heavy bleeding, or clots ( See above notes about blood in urine ). ?Inability to urinate. ?Drug reactions ( hives, rash, nausea, vomiting, diarrhea ). ?Severe burning or pain with urination that is not improving. ? ?FOLLOW-UP: ?You will need a follow-up appointment to monitor your progress. Call for this appointment at the number listed above. Usually the first appointment will be about three to fourteen days after your surgery. ? ? ?You have a stent draining your kidney. You may remove stent by pulling on string on Thursday AM. ? ? ?Post Anesthesia Home Care Instructions ? ?Activity: ?Get plenty of rest for the remainder of the day. A responsible individual must stay with you for 24 hours following the procedure.  ?For the next 24  hours, DO NOT: ?-Drive a car ?-Operate machinery ?-Drink alcoholic beverages ?-Take any medication unless instructed by your physician ?-Make any legal decisions or sign important papers. ? ?Meals: ?Start with liquid foods such as gelatin or soup. Progress to regular foods as tolerated. Avoid greasy, spicy, heavy foods. If nausea and/or vomiting occur, drink only clear liquids until the nausea and/or vomiting subsides.  Call your physician if vomiting continues. ? ?Special Instructions/Symptoms: ?Your throat may feel dry or sore from the anesthesia or the breathing tube placed in your throat during surgery. If this causes discomfort, gargle with warm salt water. The discomfort should disappear within 24 hours. ? ?If you had a scopolamine patch placed behind your ear for the management of post- operative nausea and/or vomiting: ? ?1. The medication in the patch is effective for 72 hours, after which it should be removed.  Wrap patch in a tissue and discard in the trash. Wash hands thoroughly with soap and water. ?2. You may remove the patch earlier than 72 hours if you experience unpleasant side effects which may include dry mouth, dizziness or visual disturbances. ?3. Avoid touching the patch. Wash your hands with soap and water after contact with the patch. ?   ? ?

## 2021-07-26 NOTE — H&P (Signed)
Urology Preoperative H&P  ? ?Chief Complaint: Left ureteral stone ? ?History of Present Illness: [name] is a 76 y.o. female with left ureteral stone here for definitive management. Preop Ucx NG. ? ? ? ?Past Medical History:  ?Diagnosis Date  ? Chronic cystitis   ? Chronic diastolic CHF (congestive heart failure) ([place])   ? followed by dr Johnsie Cancel  ? Chronic kidney disease, stage I   ? Cirrhosis of liver without ascites ([place]) 03/2016  ? followed by dr [name] (GI);   due to fatty liver;   post op cabg 09/ 2017  , post paracentesis for ascites  ? Coronary artery disease 01/2016  ? cardiologist--- dr Frances Nickels;   01-27-2016  NSTEMI  s/p cardiac cath;   s/p CABG 01/29/16: LIMA-LAD, SVG-RI, SVG-dRCA  ? DDD (degenerative disc disease), cervical   ? per pt w/ chronic neck pain  ? Edema of right lower extremity   ? Esophageal varices (HCC)   ? followed by dr [name];   hx multiple banding of large varices  ? Essential hypertension   ? no meds  ? GERD (gastroesophageal reflux disease)   ? Heart murmur   ?  DX FOR YEARS ASYMPTOMATIC  ? Hepatic encephalopathy   ? History of adenomatous polyp of colon   ? History of COVID-19 04/2020  ? per pt mild  symptoms that resolve  ? History of kidney stones   ? History of non-ST elevation myocardial infarction (NSTEMI) 01/27/2016  ? Hyperlipidemia   ? diet controlled  ? NAFLD (nonalcoholic fatty liver disease)   ? Neuropathy, peripheral 04/01/2015  ? OA (osteoarthritis)   ? Paroxysmal A-fib ([place])   ? 08/19/19 afib with RVR, spontaneously converted to SR  ? Portal vein thrombosis   ? per dr [name]  ,  chronic non-occluded  ? Thrombocytopenia ([place])   ? followed by pcp  and hematology-- dr Burr Medico;   likely secondary to liver cirrhosis  ? Type 2 diabetes mellitus treated with insulin ([place])   ? followed by pcp    (07-21-2021  per pt checks blood sugar dialy in am fasting average 113--200s)  ? Urge incontinence of urine   ? Wears hearing aid in both ears   ? ? ?Past Surgical History:   ?Procedure Laterality Date  ? CARDIAC CATHETERIZATION N/A 01/27/2016  ? Procedure: Left Heart Cath and Coronary Angiography;  Surgeon: Belva Crome, MD;  Location: [place];  Service: Cardiovascular;  Laterality: N/A;  ? COLONOSCOPY WITH PROPOFOL N/A 07/07/2016  ? Procedure: COLONOSCOPY WITH PROPOFOL;  Surgeon: [name], MD;  Location: [facility];  Service: Endoscopy;  Laterality: N/A;  ? CORONARY ARTERY BYPASS GRAFT N/A 01/28/2016  ? Procedure: CORONARY ARTERY BYPASS GRAFTING (CABG) x 3 USING RIGHT LEG GREATER SAPHENOUS VEIN GRAFT;  Surgeon: Melrose Nakayama, MD;  Location: [facility];  Service: Open Heart Surgery;  Laterality: N/A;  ? CYSTOSCOPY WITH RETROGRADE PYELOGRAM, URETEROSCOPY AND STENT PLACEMENT Left 05/30/2021  ? Procedure: CYSTOSCOPY WITH RETROGRADE PYELOGRAM, URETEROSCOPY AND STENT PLACEMENT;  Surgeon: Ceasar Mons, MD;  Location: [facility];  Service: Urology;  Laterality: Left;  ? ENDOVEIN HARVEST OF GREATER SAPHENOUS VEIN Right 01/28/2016  ? Procedure: ENDOVEIN HARVEST OF GREATER SAPHENOUS VEIN;  Surgeon: Melrose Nakayama, MD;  Location: [place];  Service: Open Heart Surgery;  Laterality: Right;  ? ESOPHAGEAL BANDING  03/28/2019  ? Procedure: ESOPHAGEAL BANDING;  Surgeon: [name], MD;  Location: [facility];  Service: Endoscopy;;  ? ESOPHAGEAL  BANDING  04/07/2019  ? Procedure: ESOPHAGEAL BANDING;  Surgeon: Juanita Craver, MD;  Location: [facility] ENDOSCOPY;  Service: Endoscopy;;  ? ESOPHAGOGASTRODUODENOSCOPY N/A 04/07/2019  ? Procedure: ESOPHAGOGASTRODUODENOSCOPY (EGD);  Surgeon: Juanita Craver, MD;  Location[facility];  Service: Endoscopy;  Laterality: N/A;  ? ESOPHAGOGASTRODUODENOSCOPY (EGD) WITH PROPOFOL N/A 07/07/2016  ? Procedure: ESOPHAGOGASTRODUODENOSCOPY (EGD) WITH PROPOFOL;  Surgeon: [name], MD;  Location: [facility];  Service: Endoscopy;  Laterality: N/A;  ? ESOPHAGOGASTRODUODENOSCOPY (EGD) WITH PROPOFOL N/A 03/28/2019  ? Procedure:  ESOPHAGOGASTRODUODENOSCOPY (EGD) WITH PROPOFOL;  Surgeon: [name], MD;  Location: [facility];  Service: Endoscopy;  Laterality: N/A;  ? EXTRACORPOREAL SHOCK WAVE LITHOTRIPSY  2010  ? HEMOSTASIS CLIP PLACEMENT  04/07/2019  ? Procedure: HEMOSTASIS CLIP PLACEMENT;  Surgeon: Juanita Craver, MD;  Location: [facility] ENDOSCOPY;  Service: Endoscopy;;  ? IR ANGIOGRAM SELECTIVE EACH ADDITIONAL VESSEL  04/08/2019  ? IR EMBO ART  VEN HEMORR LYMPH EXTRAV  INC GUIDE ROADMAPPING  04/08/2019  ? IR PARACENTESIS  04/08/2019  ? IR RADIOLOGIST EVAL & MGMT  06/13/2019  ? IR RADIOLOGIST EVAL & MGMT  09/25/2019  ? IR RADIOLOGIST EVAL & MGMT  07/07/2020  ? IR TIPS  04/08/2019  ? MAXIMUM ACCESS (MAS)POSTERIOR LUMBAR INTERBODY FUSION (PLIF) 1 LEVEL Left 06/10/2015  ? Procedure: FOR MAXIMUM ACCESS (MAS) POSTERIOR LUMBAR INTERBODY FUSION (PLIF) LUMBAR THREE-FOUR EXTRAFORAMINAL MICRODISCECTOMY LUMBAR FIVE-SACRAL ONE LEFT;  Surgeon: Eustace Moore, MD;  Location: [place] [facility];  Service: Neurosurgery;  Laterality: Left;  ? RADIOLOGY WITH ANESTHESIA N/A 04/08/2019  ? Procedure: RADIOLOGY WITH ANESTHESIA;  Surgeon: Radiologist, Medication, MD;  Location: [place];  Service: Radiology;  Laterality: N/A;  ? SCLEROTHERAPY  04/07/2019  ? Procedure: SCLEROTHERAPY;  Surgeon: Juanita Craver, MD;  Location[facility] ENDOSCOPY;  Service: Endoscopy;;  ? TEE WITHOUT CARDIOVERSION N/A 01/28/2016  ? Procedure: TRANSESOPHAGEAL ECHOCARDIOGRAM (TEE);  Surgeon: Melrose Nakayama, MD;  Location: [place];  Service: Open Heart Surgery;  Laterality: N/A;  ? TUBAL LIGATION  1982  ? Dr Connye Burkitt  ? UMBILICAL HERNIA REPAIR N/A 09/10/2019  ? Procedure: HERNIA REPAIR UMBILICAL ADULT;  Surgeon: Donnie Mesa, MD;  Location: [place];  Service: General;  Laterality: N/A;  ? [place]  ? Dr Rande Lawman  ? ? ?Allergies:  ?Allergies  ?Allergen Reactions  ? Kiwi Extract Anaphylaxis  ? Tdap [Tetanus-Diphth-Acell Pertussis] Swelling and Other (See Comments)  ?  Swelling at injection  site, gets very hot  ? Statins Other (See Comments)  ?  RHABDOMYOLYSIS  ? Latex Itching, Dermatitis and Rash  ? Tramadol Nausea And Vomiting  ? ? ?Family History  ?Problem Relation Age of Onset  ? Heart disease Mother   ? Breast cancer Mother   ? Lung cancer Father   ? Arthritis Sister   ? Arthritis Brother   ? Liver cancer Brother   ? Breast cancer Maternal Aunt   ? Breast cancer Paternal Aunt   ? Heart disease Maternal Grandmother   ? Heart disease Maternal Grandfather   ? Heart disease Paternal Grandmother   ? Heart disease Paternal Grandfather   ? Other Daughter   ?     house fire  ? Colon cancer Neg Hx   ? Esophageal cancer Neg Hx   ? Rectal cancer Neg Hx   ? Stomach cancer Neg Hx   ? ? ?Social History:  reports that she has never smoked. She has never used smokeless tobacco. She reports that she does not drink alcohol and does not  use drugs. ? ?ROS: ?A complete review of systems was performed.  All systems are negative except for pertinent findings as noted. ? ?Physical Exam:  ?Vital signs in last 24 hours: ?  ?Constitutional:  Alert and oriented, No acute distress ?Cardiovascular: Regular rate and rhythm ?Respiratory: Normal respiratory effort, Lungs clear bilaterally ?GI: Abdomen is soft, nontender, nondistended, no abdominal masses ?GU: No CVA tenderness ?Lymphatic: No lymphadenopathy ?Neurologic: Grossly intact, no focal deficits ?Psychiatric: Normal mood and affect ? ?Laboratory Data:  ?No results for input(s): WBC, HGB, HCT, PLT in the last 72 hours. ? ?No results for input(s): NA, K, CL, GLUCOSE, BUN, CALCIUM, CREATININE in the last 72 hours. ? ?Invalid input(s): CO3 ? ? ?No results found for this or any previous visit (from the past 24 hour(s)). ?No results found for this or any previous visit (from the past 240 hour(s)). ? ?Renal Function: ?No results for input(s): CREATININE in the last 168 hours. ?CrCl cannot be calculated (Patient's most recent lab result is older than the maximum 21 days  allowed.). ? ?Radiologic Imaging: ?No results found. ? ?I independently reviewed the above imaging studies. ? ?Assessment and Plan ?[name] is a 76 y.o. female with a left ureteral stone here for definitive manag

## 2021-07-27 ENCOUNTER — Other Ambulatory Visit: Payer: Self-pay

## 2021-07-27 ENCOUNTER — Encounter (HOSPITAL_BASED_OUTPATIENT_CLINIC_OR_DEPARTMENT_OTHER): Payer: Self-pay | Admitting: Urology

## 2021-07-27 ENCOUNTER — Telehealth: Payer: Self-pay

## 2021-07-27 DIAGNOSIS — E1169 Type 2 diabetes mellitus with other specified complication: Secondary | ICD-10-CM

## 2021-07-27 MED ORDER — EZETIMIBE 10 MG PO TABS: 10.0000 mg | ORAL_TABLET | Freq: Every day | ORAL | 1 refills | Status: DC

## 2021-07-27 NOTE — Telephone Encounter (Signed)
Called and discussed with the patient her [place] are ready for pick up.  ?

## 2021-07-27 NOTE — Telephone Encounter (Signed)
Patient called to check on status for this medication. ?

## 2021-07-27 NOTE — Telephone Encounter (Signed)
Patient aware that I spoke with Vicente Males at [facility] who stated that Xifaxan's expected ship date is 07-29-21 by 9pm.  Patient worried that she will run out of the medication she has at home.  Per Dr [name], patient should be OK to miss a few doses.  Patient advised to contact our office once Xifaxan arrives.  Patient agreed to plan and verbalized understanding. No further questions.  ?

## 2021-07-30 ENCOUNTER — Ambulatory Visit
Admission: RE | Admit: 2021-07-30 | Discharge: 2021-07-30 | Disposition: A | Discharge status: Home / Self Care | Payer: Medicare Other | Source: Ambulatory Visit | Attending: Interventional Radiology | Admitting: Interventional Radiology

## 2021-07-30 ENCOUNTER — Encounter: Payer: Self-pay | Admitting: *Deleted

## 2021-07-30 DIAGNOSIS — Z95828 Presence of other vascular implants and grafts: Secondary | ICD-10-CM

## 2021-07-30 DIAGNOSIS — I8500 Esophageal varices without bleeding: Secondary | ICD-10-CM | POA: Diagnosis not present

## 2021-07-30 DIAGNOSIS — K766 Portal hypertension: Secondary | ICD-10-CM | POA: Diagnosis not present

## 2021-07-30 HISTORY — PX: IR RADIOLOGIST EVAL & MGMT: IMG5224

## 2021-07-30 NOTE — Progress Notes (Signed)
Patient ID: [name], female   DOB: Apr 13, 1946, 76 y.o.   MRN: [id number] ?    ? ? ?Chief Complaint: ?Patient was seen in consultation today for   follow-up TIPS at the request of Kimberli Winne ? ?Referring Physician(s): ?Oretha Caprice, MD ? ?History of Present Illness: ?[name] is a 76 y.o. female  with a history of NASH cirrhosis ?  ?03/28/2019 EGD with esophageal banding x14, subsequently persistent  retrosternal pain ? 04/06/2019 CT demonstrates portal venous thrombus, possible splenic infarct.  No evidence of liver mass. ?04/07/2019 EGD demonstrating esophageal bleeding requiring epinephrine injection and clipping.  Persistent bleeding per rectum. ?04/08/2019 TIPS creation with decrease in mean portosystemic gradient from 15 to 4 mmHg, coil embolization of esophageal varices, paracentesis removing 1.5 L ? 04/12/2019 discharged home, no further bleeding no encephalopathy. ?05/15/2019 to ED with worsening encephalopathy, started on maximum lactulose, rifaximin ?05/26/2019 to ED with acute metabolic encephalopathy secondary to hypercalcemia, ammonia level stable ?05/29/2019 US liver Doppler shows patency of TIPS shunt, concern of right lobe mass 3.8 cm, new since previous imaging from November ?05/30/2019 MR liver protocol shows 3 cm mixed signal right lobe mass lateral to the mid portion of the TIPS, indeterminate but biloma favored, some motion degradation on the study ? 09/06/2019 follow-up MRI shows decrease in size of hematoma 1.7 cm, no new lesion or suspicious hepatic lesion ?07/07/2020 TIPS ultrasound shows good antegrade flow, No ascites, resolving intrahepatic hematoma. ?08/20/2020 MR abdomen shows no suspicious hepatic lesions ?06/15/2021 MRAbdomen shows no worrisome hepatic lesions. ?Patient is doing fairly well at home.  She had TIPS ultrasound today. She denies any confusion, sleepiness, or encephalopathy.  She is still taking her lactulose.  No abdominal pain.  Appetite good.  No  further GI bleeding.  No abdominal distention.   ? ?Past Medical History:  ?Diagnosis Date  ? Chronic cystitis   ? Chronic diastolic CHF (congestive heart failure) ([place])   ? followed by dr Johnsie Cancel  ? Chronic kidney disease, stage I   ? Cirrhosis of liver without ascites ([place]) 03/2016  ? followed by dr [name] (GI);   due to fatty liver;   post op cabg 09/ 2017  , post paracentesis for ascites  ? Coronary artery disease 01/2016  ? cardiologist--- dr Frances Nickels;   01-27-2016  NSTEMI  s/p cardiac cath;   s/p CABG 01/29/16: LIMA-LAD, SVG-RI, SVG-dRCA  ? DDD (degenerative disc disease), cervical   ? per pt w/ chronic neck pain  ? Edema of right lower extremity   ? Esophageal varices (HCC)   ? followed by dr [name];   hx multiple banding of large varices  ? Essential hypertension   ? no meds  ? GERD (gastroesophageal reflux disease)   ? Heart murmur   ?  DX FOR YEARS ASYMPTOMATIC  ? Hepatic encephalopathy   ? History of adenomatous polyp of colon   ? History of COVID-19 04/2020  ? per pt mild  symptoms that resolve  ? History of kidney stones   ? History of non-ST elevation myocardial infarction (NSTEMI) 01/27/2016  ? Hyperlipidemia   ? diet controlled  ? NAFLD (nonalcoholic fatty liver disease)   ? Neuropathy, peripheral 04/01/2015  ? OA (osteoarthritis)   ? Paroxysmal A-fib ([place])   ? 08/19/19 afib with RVR, spontaneously converted to SR  ? Portal vein thrombosis   ? per dr [name]  ,  chronic non-occluded  ? Thrombocytopenia ([place])   ? followed by pcp  and hematology-- dr Burr Medico;  likely secondary to liver cirrhosis  ? Type 2 diabetes mellitus treated with insulin ([place])   ? followed by pcp    (07-21-2021  per pt checks blood sugar dialy in am fasting average 113--200s)  ? Urge incontinence of urine   ? Wears hearing aid in both ears   ? ? ?Past Surgical History:  ?Procedure Laterality Date  ? CARDIAC CATHETERIZATION N/A 01/27/2016  ? Procedure: Left Heart Cath and Coronary Angiography;  Surgeon: Belva Crome, MD;  Location: [place] [facility];  Service: Cardiovascular;  Laterality: N/A;  ? COLONOSCOPY WITH PROPOFOL N/A 07/07/2016  ? Procedure: COLONOSCOPY WITH PROPOFOL;  Surgeon: [name], MD;  Location: [facility];  Service: Endoscopy;  Laterality: N/A;  ? CORONARY ARTERY BYPASS GRAFT N/A 01/28/2016  ? Procedure: CORONARY ARTERY BYPASS GRAFTING (CABG) x 3 USING RIGHT LEG GREATER SAPHENOUS VEIN GRAFT;  Surgeon: Melrose Nakayama, MD;  Location: [facility];  Service: Open Heart Surgery;  Laterality: N/A;  ? CYSTOSCOPY WITH RETROGRADE PYELOGRAM, URETEROSCOPY AND STENT PLACEMENT Left 05/30/2021  ? Procedure: CYSTOSCOPY WITH RETROGRADE PYELOGRAM, URETEROSCOPY AND STENT PLACEMENT;  Surgeon: Ceasar Mons, MD;  Location: [facility];  Service: Urology;  Laterality: Left;  ? CYSTOSCOPY/URETEROSCOPY/HOLMIUM LASER/STENT PLACEMENT Left 07/26/2021  ? Procedure: CYSTOSCOPY/RETROGRADE/URETEROSCOPY/HOLMIUM LASER/STENT EXCHANGE;  Surgeon: Janith Lima, MD;  Location[facility];  Service: Urology;  Laterality: Left;  ? ENDOVEIN HARVEST OF GREATER SAPHENOUS VEIN Right 01/28/2016  ? Procedure: ENDOVEIN HARVEST OF GREATER SAPHENOUS VEIN;  Surgeon: Melrose Nakayama, MD;  Location: [place];  Service: Open Heart Surgery;  Laterality: Right;  ? ESOPHAGEAL BANDING  03/28/2019  ? Procedure: ESOPHAGEAL BANDING;  Surgeon: [name], MD;  Location: [facility];  Service: Endoscopy;;  ? ESOPHAGEAL BANDING  04/07/2019  ? Procedure: ESOPHAGEAL BANDING;  Surgeon: Juanita Craver, MD;  Location: [facility] ENDOSCOPY;  Service: Endoscopy;;  ? ESOPHAGOGASTRODUODENOSCOPY N/A 04/07/2019  ? Procedure: ESOPHAGOGASTRODUODENOSCOPY (EGD);  Surgeon: Juanita Craver, MD;  Location: [facility] ENDOSCOPY;  Service: Endoscopy;  Laterality: N/A;  ? ESOPHAGOGASTRODUODENOSCOPY (EGD) WITH PROPOFOL N/A 07/07/2016  ? Procedure: ESOPHAGOGASTRODUODENOSCOPY (EGD) WITH PROPOFOL;  Surgeon: [name], MD;  Location: [facility];  Service: Endoscopy;  Laterality: N/A;  ?  ESOPHAGOGASTRODUODENOSCOPY (EGD) WITH PROPOFOL N/A 03/28/2019  ? Procedure: ESOPHAGOGASTRODUODENOSCOPY (EGD) WITH PROPOFOL;  Surgeon: [name], MD;  Location: [facility];  Service: Endoscopy;  Laterality: N/A;  ? EXTRACORPOREAL SHOCK WAVE LITHOTRIPSY  2010  ? HEMOSTASIS CLIP PLACEMENT  04/07/2019  ? Procedure: HEMOSTASIS CLIP PLACEMENT;  Surgeon: Juanita Craver, MD;  Location: [facility] ENDOSCOPY;  Service: Endoscopy;;  ? IR ANGIOGRAM SELECTIVE EACH ADDITIONAL VESSEL  04/08/2019  ? IR EMBO ART  VEN HEMORR LYMPH EXTRAV  INC GUIDE ROADMAPPING  04/08/2019  ? IR PARACENTESIS  04/08/2019  ? IR RADIOLOGIST EVAL & MGMT  06/13/2019  ? IR RADIOLOGIST EVAL & MGMT  09/25/2019  ? IR RADIOLOGIST EVAL & MGMT  07/07/2020  ? IR RADIOLOGIST EVAL & MGMT  07/30/2021  ? IR TIPS  04/08/2019  ? MAXIMUM ACCESS (MAS)POSTERIOR LUMBAR INTERBODY FUSION (PLIF) 1 LEVEL Left 06/10/2015  ? Procedure: FOR MAXIMUM ACCESS (MAS) POSTERIOR LUMBAR INTERBODY FUSION (PLIF) LUMBAR THREE-FOUR EXTRAFORAMINAL MICRODISCECTOMY LUMBAR FIVE-SACRAL ONE LEFT;  Surgeon: Eustace Moore, MD;  Location: [place] [facility];  Service: Neurosurgery;  Laterality: Left;  ? RADIOLOGY WITH ANESTHESIA N/A 04/08/2019  ? Procedure: RADIOLOGY WITH ANESTHESIA;  Surgeon: Radiologist, Medication, MD;  Location: [place];  Service: Radiology;  Laterality: N/A;  ? SCLEROTHERAPY  04/07/2019  ? Procedure: SCLEROTHERAPY;  Surgeon: Juanita Craver, MD;  Location: [facility] ENDOSCOPY;  Service: Endoscopy;;  ? TEE WITHOUT CARDIOVERSION N/A 01/28/2016  ? Procedure: TRANSESOPHAGEAL ECHOCARDIOGRAM (TEE);  Surgeon: Melrose Nakayama, MD;  Location: [place];  Service: Open Heart Surgery;  Laterality: N/A;  ? TUBAL LIGATION  1982  ? Dr Connye Burkitt  ? UMBILICAL HERNIA REPAIR N/A 09/10/2019  ? Procedure: HERNIA REPAIR UMBILICAL ADULT;  Surgeon: Donnie Mesa, MD;  Location: [place];  Service: General;  Laterality: N/A;  ? [place]  ? Dr Rande Lawman  ? ? ?Allergies: ?Kiwi extract, Tdap [tetanus-diphth-acell  pertussis], Statins, Latex, and Tramadol ? ?Medications: ?Prior to Admission medications   ?Medication Sig Start Date End Date Taking? Authorizing Provider  ?acetaminophen (TYLENOL) 500 MG tablet Take 1,000 mg b

## 2021-08-06 ENCOUNTER — Telehealth: Payer: Self-pay

## 2021-08-06 NOTE — Telephone Encounter (Signed)
Spoke with patient and informed her that 2 boxes of patient assistance pen needles were received. ? ?Patient aware she can pick up Monday-Friday,  8 am- 5 pm  ?

## 2021-08-10 ENCOUNTER — Encounter: Payer: Self-pay | Admitting: Family

## 2021-08-10 ENCOUNTER — Ambulatory Visit (INDEPENDENT_AMBULATORY_CARE_PROVIDER_SITE_OTHER): Payer: Medicare Other | Admitting: Family

## 2021-08-10 DIAGNOSIS — Z1211 Encounter for screening for malignant neoplasm of colon: Secondary | ICD-10-CM

## 2021-08-10 DIAGNOSIS — Z0000 Encounter for general adult medical examination without abnormal findings: Secondary | ICD-10-CM | POA: Diagnosis not present

## 2021-08-10 NOTE — Patient Instructions (Signed)
[name] , ?Thank you for taking time to come for your Medicare Wellness Visit. I appreciate your ongoing commitment to your health goals. Please review the following plan we discussed and let me know if I can assist you in the future.  ? ?Screening recommendations/referrals: ?Colonoscopy : due ordered today. ?Mammogram : Up to date  ?Bone Density : Up to date  ?Recommended yearly ophthalmology/optometry visit for glaucoma screening and checkup ?Recommended yearly dental visit for hygiene and checkup ? ?Vaccinations: ?Influenza vaccine : Up to date  ?Pneumococcal vaccine : Up to date  ?Tdap vaccine : Up to date  ?Shingles vaccine : due please get vaccine at your pharmacy    ? ?Advanced directives: Yes  ? ?Conditions/risks identified: Advance age female > 88 yrs,sedentary,type 2 DM,Dyslipidemia   ? ?Next appointment: one year for Medicare Wellness Visit  ? ? ?Preventive Care 22 Years and Older, Female ?Preventive care refers to lifestyle choices and visits with your health care provider that can promote health and wellness. ?What does preventive care include? ?A yearly physical exam. This is also called an annual well check. ?Dental exams once or twice a year. ?Routine eye exams. Ask your health care provider how often you should have your eyes checked. ?Personal lifestyle choices, including: ?Daily care of your teeth and gums. ?Regular physical activity. ?Eating a healthy diet. ?Avoiding tobacco and drug use. ?Limiting alcohol use. ?Practicing safe sex. ?Taking low-dose aspirin every day. ?Taking vitamin and mineral supplements as recommended by your health care provider. ?What happens during an annual well check? ?The services and screenings done by your health care provider during your annual well check will depend on your age, overall health, lifestyle risk factors, and family history of disease. ?Counseling  ?Your health care provider may ask you questions about your: ?Alcohol use. ?Tobacco use. ?Drug  use. ?Emotional well-being. ?Home and relationship well-being. ?Sexual activity. ?Eating habits. ?History of falls. ?Memory and ability to understand (cognition). ?Work and work Statistician. ?Reproductive health. ?Screening  ?You may have the following tests or measurements: ?Height, weight, and BMI. ?Blood pressure. ?Lipid and cholesterol levels. These may be checked every 5 years, or more frequently if you are over 71 years old. ?Skin check. ?Lung cancer screening. You may have this screening every year starting at age 89 if you have a 30-pack-year history of smoking and currently smoke or have quit within the past 15 years. ?Fecal occult blood test (FOBT) of the stool. You may have this test every year starting at age 26. ?Flexible sigmoidoscopy or colonoscopy. You may have a sigmoidoscopy every 5 years or a colonoscopy every 10 years starting at age 61. ?Hepatitis C blood test. ?Hepatitis B blood test. ?Sexually transmitted disease (STD) testing. ?Diabetes screening. This is done by checking your blood sugar (glucose) after you have not eaten for a while (fasting). You may have this done every 1-3 years. ?Bone density scan. This is done to screen for osteoporosis. You may have this done starting at age 12. ?Mammogram. This may be done every 1-2 years. Talk to your health care provider about how often you should have regular mammograms. ?Talk with your health care provider about your test results, treatment options, and if necessary, the need for more tests. ?Vaccines  ?Your health care provider may recommend certain vaccines, such as: ?Influenza vaccine. This is recommended every year. ?Tetanus, diphtheria, and acellular pertussis (Tdap, Td) vaccine. You may need a Td booster every 10 years. ?Zoster vaccine. You may need this after age  60. ?Pneumococcal 13-valent conjugate (PCV13) vaccine. One dose is recommended after age 26. ?Pneumococcal polysaccharide (PPSV23) vaccine. One dose is recommended after age  67. ?Talk to your health care provider about which screenings and vaccines you need and how often you need them. ?This information is not intended to replace advice given to you by your health care provider. Make sure you discuss any questions you have with your health care provider. ?Document Released: 05/22/2015 Document Revised: 01/13/2016 Document Reviewed: 02/24/2015 ?Elsevier Interactive Patient Education ? 2017 [place]. ? ?Fall Prevention in the Home ?Falls can cause injuries. They can happen to people of all ages. There are many things you can do to make your home safe and to help prevent falls. ?What can I do on the outside of my home? ?Regularly fix the edges of walkways and driveways and fix any cracks. ?Remove anything that might make you trip as you walk through a door, such as a raised step or threshold. ?Trim any bushes or trees on the path to your home. ?Use bright outdoor lighting. ?Clear any walking paths of anything that might make someone trip, such as rocks or tools. ?Regularly check to see if handrails are loose or broken. Make sure that both sides of any steps have handrails. ?Any raised decks and porches should have guardrails on the edges. ?Have any leaves, snow, or ice cleared regularly. ?Use sand or salt on walking paths during winter. ?Clean up any spills in your garage right away. This includes oil or grease spills. ?What can I do in the bathroom? ?Use night lights. ?Install grab bars by the toilet and in the tub and shower. Do not use towel bars as grab bars. ?Use non-skid mats or decals in the tub or shower. ?If you need to sit down in the shower, use a plastic, non-slip stool. ?Keep the floor dry. Clean up any water that spills on the floor as soon as it happens. ?Remove soap buildup in the tub or shower regularly. ?Attach bath mats securely with double-sided non-slip rug tape. ?Do not have throw rugs and other things on the floor that can make you trip. ?What can I do in the  bedroom? ?Use night lights. ?Make sure that you have a light by your bed that is easy to reach. ?Do not use any sheets or blankets that are too big for your bed. They should not hang down onto the floor. ?Have a firm chair that has side arms. You can use this for support while you get dressed. ?Do not have throw rugs and other things on the floor that can make you trip. ?What can I do in the kitchen? ?Clean up any spills right away. ?Avoid walking on wet floors. ?Keep items that you use a lot in easy-to-reach places. ?If you need to reach something above you, use a strong step stool that has a grab bar. ?Keep electrical cords out of the way. ?Do not use floor polish or wax that makes floors slippery. If you must use wax, use non-skid floor wax. ?Do not have throw rugs and other things on the floor that can make you trip. ?What can I do with my stairs? ?Do not leave any items on the stairs. ?Make sure that there are handrails on both sides of the stairs and use them. Fix handrails that are broken or loose. Make sure that handrails are as long as the stairways. ?Check any carpeting to make sure that it is firmly attached to the stairs. Fix  any carpet that is loose or worn. ?Avoid having throw rugs at the top or bottom of the stairs. If you do have throw rugs, attach them to the floor with carpet tape. ?Make sure that you have a light switch at the top of the stairs and the bottom of the stairs. If you do not have them, ask someone to add them for you. ?What else can I do to help prevent falls? ?Wear shoes that: ?Do not have high heels. ?Have rubber bottoms. ?Are comfortable and fit you well. ?Are closed at the toe. Do not wear sandals. ?If you use a stepladder: ?Make sure that it is fully opened. Do not climb a closed stepladder. ?Make sure that both sides of the stepladder are locked into place. ?Ask someone to hold it for you, if possible. ?Clearly mark and make sure that you can see: ?Any grab bars or  handrails. ?First and last steps. ?Where the edge of each step is. ?Use tools that help you move around (mobility aids) if they are needed. These include: ?Canes. ?Walkers. ?Scooters. ?Crutches. ?Turn on the lights when you go int

## 2021-08-10 NOTE — Progress Notes (Signed)
?This service is provided via telemedicine ? ?No vital signs collected/recorded due to the encounter was a telemedicine visit.  ? ?Location of patient (ex: home, work):  Home ? ?Patient consents to a telephone visit:  Yes ? ?Location of the provider (ex: office, home):  [employer]. ? ?Name of any referring provider:  [name], MD  ? ?Names of all persons participating in the telemedicine service and their role in the encounter:  Patient, [name], [name], [name], [name], NP.   ? ?Time spent on call: 8 minutes spent on the phone with Medical Assistant.   ? ? ? ?Subjective:  ? [name] is a 76 y.o. female who presents for Medicare Annual (Subsequent) preventive examination. ? ?Review of Systems    ?Cardiac Risk Factors include: advanced age (>84mn, >>63women);diabetes mellitus;dyslipidemia;sedentary lifestyle ? ?   ?Objective:  ?  ?Today's Vitals  ? 08/10/21 1325  ?PainSc: 7   ? ?There is no height or weight on file to calculate BMI. ? ? ?  08/10/2021  ?  1:19 PM 07/26/2021  ? 12:38 PM 06/07/2021  ?  4:20 PM 05/30/2021  ?  1:20 AM 08/26/2020  ?  9:45 AM 08/05/2020  ?  8:39 AM 06/15/2020  ? 10:44 AM  ?Advanced Directives  ?Does Patient Have a Medical Advance Directive? Yes Yes No No Yes Yes Yes  ?Type of AIndustrial/product designerof [place]a [place] will [place] will Living will;Healthcare Power of Attorney  ?Does patient want to make changes to medical advance directive? No - Patient declined  No - Patient declined  No - Patient declined No - Patient declined No - Patient declined  ?Copy of HRomein Chart? Yes - validated most recent copy scanned in chart (See row information) No - copy requested   Yes - validated most recent copy scanned in chart (See row information) Yes - validated most recent copy scanned in chart (See row information) Yes - validated most  recent copy scanned in chart (See row information)  ?Would patient like information on creating a medical advance directive?    No - Patient declined     ? ? ?Current Medications (verified) ?Outpatient Encounter Medications as of 08/10/2021  ?Medication Sig  ? acetaminophen (TYLENOL) 500 MG tablet Take 1,000 mg by mouth every 6 (six) hours.  ? Biotin 10000 MCG TABS Take 10,000 mcg by mouth daily.  ? Calcium Carb-Cholecalciferol (CALCIUM 600 + D PO) Take 1 tablet by mouth in the morning and at bedtime.  ? Cholecalciferol (VITAMIN D-3 PO) Take 1,000 Units by mouth daily.  ? Cyanocobalamin (VITAMIN B 12 PO) Take 1,000 mcg by mouth daily.  ? empagliflozin (JARDIANCE) 25 MG TABS tablet Take 1 tablet (25 mg total) by mouth daily.  ? ezetimibe (ZETIA) 10 MG tablet Take 1 tablet (10 mg total) by mouth daily.  ? fluticasone (FLONASE) 50 MCG/ACT nasal spray Place 1 spray into both nostrils as needed for allergies or rhinitis.  ? furosemide (LASIX) 20 MG tablet TAKE 1 TABLET(20 MG) BY MOUTH DAILY  ? glucose blood test strip One Touch Ultra strips. Use to test blood sugar three times daily. Dx: E11.65  ? insulin detemir (LEVEMIR FLEXTOUCH) 100 UNIT/ML FlexPen Inject 25 Units into the skin at bedtime.  ? Insulin Pen Needle (BD PEN NEEDLE NANO U/F) 32G X 4 MM MISC USE THREE TIMES DAILY AS DIRECTED  ?  Insulin Syringe-Needle U-100 (INSULIN SYRINGE 1CC/31GX5/16") 31G X 5/16" 1 ML MISC USE AS DIRECTED TO INJECT LEVIMIR  ? lactulose (CHRONULAC) 10 GM/15ML solution Take 30 mLs (20 g total) by mouth daily.  ? LORazepam (ATIVAN) 0.5 MG tablet Take 1 tablet (0.5 mg total) by mouth 2 (two) times daily as needed for anxiety.  ? MAGNESIUM PO Take 500 mg by mouth daily.   ? metoprolol tartrate (LOPRESSOR) 25 MG tablet Take 1 tablet (25 mg total) by mouth as needed (use as needed for HR greater than 110).  ? Multiple Vitamins-Minerals (MULTIVITAMIN WITH MINERALS) tablet Take 1 tablet by mouth daily.  ? pantoprazole (PROTONIX) 40 MG tablet TAKE 1  TABLET(40 MG) BY MOUTH DAILY  ? Polyethyl Glycol-Propyl Glycol (SYSTANE OP) Place 1 drop into both eyes at bedtime.  ? Probiotic Product (PROBIOTIC DAILY PO) Take 1 capsule by mouth daily. Digestive Advantage Probiotic  ? XIFAXAN 550 MG TABS tablet Take 1 tablet (550 mg total) by mouth 2 (two) times daily.  ? [DISCONTINUED] docusate sodium (COLACE) 100 MG capsule Take 1 capsule (100 mg total) by mouth daily as needed for up to 30 doses.  ? [DISCONTINUED] ezetimibe (ZETIA) 10 MG tablet Take 1 tablet (10 mg total) by mouth daily. (Patient taking differently: Take 10 mg by mouth daily.)  ? [DISCONTINUED] fluticasone (FLONASE) 50 MCG/ACT nasal spray Place 1 spray into both nostrils daily for 3 days. (Patient taking differently: Place 1 spray into both nostrils daily as needed.)  ? [DISCONTINUED] mupirocin cream (BACTROBAN) 2 % Apply 1 application topically 2 (two) times daily.  ? [DISCONTINUED] Omega-3 Fatty Acids (FISH OIL PO) Take 1,000 mg by mouth daily.  ? [DISCONTINUED] oxyCODONE-acetaminophen (PERCOCET) 5-325 MG tablet Take 1 tablet by mouth every 4 (four) hours as needed for up to 18 doses for severe pain.  ? [DISCONTINUED] XIFAXAN 550 MG TABS tablet Take 1 tablet (550 mg total) by mouth 2 (two) times daily.  ? ?No facility-administered encounter medications on file as of 08/10/2021.  ? ? ?Allergies (verified) ?Kiwi extract, Tdap [tetanus-diphth-acell pertussis], Statins, Latex, and Tramadol  ? ?History: ?Past Medical History:  ?Diagnosis Date  ? Chronic cystitis   ? Chronic diastolic CHF (congestive heart failure) ([place])   ? followed by dr Johnsie Cancel  ? Chronic kidney disease, stage I   ? Cirrhosis of liver without ascites ([place]) 03/2016  ? followed by dr [name] (GI);   due to fatty liver;   post op cabg 09/ 2017  , post paracentesis for ascites  ? Coronary artery disease 01/2016  ? cardiologist--- dr Frances Nickels;   01-27-2016  NSTEMI  s/p cardiac cath;   s/p CABG 01/29/16: LIMA-LAD, SVG-RI, SVG-dRCA  ? DDD (degenerative disc  disease), cervical   ? per pt w/ chronic neck pain  ? Edema of right lower extremity   ? Esophageal varices (HCC)   ? followed by dr [name];   hx multiple banding of large varices  ? Essential hypertension   ? no meds  ? GERD (gastroesophageal reflux disease)   ? Heart murmur   ?  DX FOR YEARS ASYMPTOMATIC  ? Hepatic encephalopathy ([place])   ? History of adenomatous polyp of colon   ? History of COVID-19 04/2020  ? per pt mild  symptoms that resolve  ? History of kidney stones   ? History of non-ST elevation myocardial infarction (NSTEMI) 01/27/2016  ? Hyperlipidemia   ? diet controlled  ? NAFLD (nonalcoholic fatty liver disease)   ? Neuropathy, peripheral 04/01/2015  ?  OA (osteoarthritis)   ? Paroxysmal A-fib ([place])   ? 08/19/19 afib with RVR, spontaneously converted to SR  ? Portal vein thrombosis   ? per dr [name]  ,  chronic non-occluded  ? Thrombocytopenia ([place])   ? followed by pcp  and hematology-- dr Burr Medico;   likely secondary to liver cirrhosis  ? Type 2 diabetes mellitus treated with insulin ([place])   ? followed by pcp    (07-21-2021  per pt checks blood sugar dialy in am fasting average 113--200s)  ? Urge incontinence of urine   ? Wears hearing aid in both ears   ? ?Past Surgical History:  ?Procedure Laterality Date  ? CARDIAC CATHETERIZATION N/A 01/27/2016  ? Procedure: Left Heart Cath and Coronary Angiography;  Surgeon: Belva Crome, MD;  Location: [facility];  Service: Cardiovascular;  Laterality: N/A;  ? COLONOSCOPY WITH PROPOFOL N/A 07/07/2016  ? Procedure: COLONOSCOPY WITH PROPOFOL;  Surgeon: [name], MD;  [facility];  Service: Endoscopy;  Laterality: N/A;  ? CORONARY ARTERY BYPASS GRAFT N/A 01/28/2016  ? Procedure: CORONARY ARTERY BYPASS GRAFTING (CABG) x 3 USING RIGHT LEG GREATER SAPHENOUS VEIN GRAFT;  Surgeon: Melrose Nakayama, MD;  Location: [facility];  Service: Open Heart Surgery;  Laterality: N/A;  ? CYSTOSCOPY WITH RETROGRADE PYELOGRAM, URETEROSCOPY AND STENT PLACEMENT  Left 05/30/2021  ? Procedure: CYSTOSCOPY WITH RETROGRADE PYELOGRAM, URETEROSCOPY AND STENT PLACEMENT;  Surgeon: Ceasar Mons, MD;  Location: [facility];  Service: Urology;  Laterality: Left;  ? CY

## 2021-08-12 ENCOUNTER — Other Ambulatory Visit: Payer: Self-pay | Admitting: *Deleted

## 2021-08-12 MED ORDER — EMPAGLIFLOZIN 25 MG PO TABS: 25.0000 mg | ORAL_TABLET | Freq: Every day | ORAL | 1 refills | Status: DC

## 2021-08-12 NOTE — Telephone Encounter (Signed)
[place] requested refill.  ?

## 2021-08-19 ENCOUNTER — Other Ambulatory Visit: Payer: Self-pay | Admitting: *Deleted

## 2021-08-19 DIAGNOSIS — N951 Menopausal and female climacteric states: Secondary | ICD-10-CM

## 2021-08-19 MED ORDER — LORAZEPAM 0.5 MG PO TABS: 0.5000 mg | ORAL_TABLET | Freq: Two times a day (BID) | ORAL | 1 refills | Status: DC | PRN

## 2021-08-19 NOTE — Telephone Encounter (Signed)
Pharmacy requested refill.  ?Epic LR: 06/22/21 ?No Contract. Added to upcoming appointment ?Pended Rx and sent to [place] For approval due to Dr. Sabra Heck out of office.  ?

## 2021-08-25 DIAGNOSIS — H2513 Age-related nuclear cataract, bilateral: Secondary | ICD-10-CM | POA: Diagnosis not present

## 2021-08-25 DIAGNOSIS — H04123 Dry eye syndrome of bilateral lacrimal glands: Secondary | ICD-10-CM | POA: Diagnosis not present

## 2021-08-25 DIAGNOSIS — E119 Type 2 diabetes mellitus without complications: Secondary | ICD-10-CM | POA: Diagnosis not present

## 2021-08-25 DIAGNOSIS — H35373 Puckering of macula, bilateral: Secondary | ICD-10-CM | POA: Diagnosis not present

## 2021-08-26 DIAGNOSIS — M5416 Radiculopathy, lumbar region: Secondary | ICD-10-CM | POA: Diagnosis not present

## 2021-08-27 ENCOUNTER — Other Ambulatory Visit: Payer: Self-pay | Admitting: Neurological Surgery

## 2021-08-27 DIAGNOSIS — M5416 Radiculopathy, lumbar region: Secondary | ICD-10-CM

## 2021-08-30 ENCOUNTER — Other Ambulatory Visit: Payer: Self-pay

## 2021-08-30 MED ORDER — LACTULOSE 10 GM/15ML PO SOLN: 20.0000 g | Freq: Every day | ORAL | 4 refills | Status: DC

## 2021-09-02 DIAGNOSIS — N201 Calculus of ureter: Secondary | ICD-10-CM | POA: Diagnosis not present

## 2021-09-09 ENCOUNTER — Inpatient Hospital Stay: Admission: RE | Admit: 2021-09-09 | Payer: Medicare Other | Source: Ambulatory Visit

## 2021-09-27 ENCOUNTER — Ambulatory Visit
Admission: RE | Admit: 2021-09-27 | Discharge: 2021-09-27 | Disposition: A | Discharge status: Home / Self Care | Payer: Medicare Other | Source: Ambulatory Visit | Attending: Neurological Surgery | Admitting: Neurological Surgery

## 2021-09-27 DIAGNOSIS — M48061 Spinal stenosis, lumbar region without neurogenic claudication: Secondary | ICD-10-CM | POA: Diagnosis not present

## 2021-09-27 DIAGNOSIS — M5416 Radiculopathy, lumbar region: Secondary | ICD-10-CM

## 2021-09-27 DIAGNOSIS — M5117 Intervertebral disc disorders with radiculopathy, lumbosacral region: Secondary | ICD-10-CM | POA: Diagnosis not present

## 2021-09-27 MED ORDER — GADOBENATE DIMEGLUMINE 529 MG/ML IV SOLN
11.0000 mL | Freq: Once | INTRAVENOUS | Status: AC | PRN
  Administered 2021-09-27: 11 mL via INTRAVENOUS

## 2021-10-05 ENCOUNTER — Encounter: Payer: Self-pay | Admitting: Family Medicine

## 2021-10-05 ENCOUNTER — Ambulatory Visit (INDEPENDENT_AMBULATORY_CARE_PROVIDER_SITE_OTHER): Payer: Medicare Other | Admitting: Family Medicine

## 2021-10-05 VITALS — BP 110/70 | HR 62 | Temp 97.3°F | Resp 18 | Ht 63.0 in | Wt 129.0 lb

## 2021-10-05 DIAGNOSIS — R188 Other ascites: Secondary | ICD-10-CM | POA: Diagnosis not present

## 2021-10-05 DIAGNOSIS — E7849 Other hyperlipidemia: Secondary | ICD-10-CM

## 2021-10-05 DIAGNOSIS — E1159 Type 2 diabetes mellitus with other circulatory complications: Secondary | ICD-10-CM | POA: Diagnosis not present

## 2021-10-05 DIAGNOSIS — I2510 Atherosclerotic heart disease of native coronary artery without angina pectoris: Secondary | ICD-10-CM | POA: Diagnosis not present

## 2021-10-05 DIAGNOSIS — K7460 Unspecified cirrhosis of liver: Secondary | ICD-10-CM | POA: Diagnosis not present

## 2021-10-05 DIAGNOSIS — Z794 Long term (current) use of insulin: Secondary | ICD-10-CM | POA: Diagnosis not present

## 2021-10-05 NOTE — Progress Notes (Signed)
Provider:  Alain Honey, MD  Careteam: Patient Care Team: [name], MD as PCP - General (Family Medicine) Josue Hector, MD as PCP - Cardiology (Cardiology) Berle Mull, MD as Consulting Physician (Family Medicine) Sharmon Revere as Physician Assistant (Cardiology) Calvert Cantor, MD as Consulting Physician (Ophthalmology) [name], MD as Attending Physician (Gastroenterology)  PLACE OF SERVICE:  [place] Directive information Does Patient Have a Medical Advance Directive?: Yes, Type of Advance Directive: [place], Does patient want to make changes to medical advance directive?: No - Patient declined  Allergies  Allergen Reactions   Kiwi Extract Anaphylaxis   Tdap [Tetanus-Diphth-Acell Pertussis] Swelling and Other (See Comments)    Swelling at injection site, gets very hot   Statins Other (See Comments)    RHABDOMYOLYSIS   Latex Itching, Dermatitis and Rash   Tramadol Nausea And Vomiting    Chief Complaint  Patient presents with   Medical Management of Chronic Issues    Patient is here for a follow up for chronic conditions has referral for colon cancer screening and has been advised on Shingrix vaccine       HPI: Patient is a 76 y.o. female patient has been seen recently by neurosurgery for back pain.  Had previous back surgery with instrumentation about 10 years ago and now is having more symptoms.  Had recent MRI which appears to show severe spinal stenosis as well as disc issues.  She may be candidate for repeat surgery depending on consultation with neurosurgeon. Her diabetes has not been well controlled recently.  Most recent A1c was 9.65 months ago.  She takes Jardiance 25 mg.  Spent some time talking about importance of diet Lipids are controlled with Zetia and LDL is at goal at 71 last assessed in August 2022. She takes Xifaxan as well as lactulose for chronic liver disease, cirrhosis  Review of  Systems:  Review of Systems  Constitutional: Negative.   Respiratory: Negative.    Cardiovascular: Negative.   Musculoskeletal:        Straight leg raising is negative reflexes are asymmetric at the knees suggesting possible L4-5 involvement  Neurological: Negative.   Psychiatric/Behavioral:  Positive for depression.   All other systems reviewed and are negative.  Past Medical History:  Diagnosis Date   Chronic cystitis    Chronic diastolic CHF (congestive heart failure) ([place])    followed by dr Johnsie Cancel   Chronic kidney disease, stage I    Cirrhosis of liver without ascites ([place]) 03/2016   followed by dr [name] (GI);   due to fatty liver;   post op cabg 09/ 2017  , post paracentesis for ascites   Coronary artery disease 01/2016   cardiologist--- dr Frances Nickels;   01-27-2016  NSTEMI  s/p cardiac cath;   s/p CABG 01/29/16: LIMA-LAD, SVG-RI, SVG-dRCA   DDD (degenerative disc disease), cervical    per pt w/ chronic neck pain   Edema of right lower extremity    Esophageal varices ([place])    followed by dr [name];   hx multiple banding of large varices   Essential hypertension    no meds   GERD (gastroesophageal reflux disease)    Heart murmur     DX FOR YEARS ASYMPTOMATIC   Hepatic encephalopathy ([place])    History of adenomatous polyp of colon    History of COVID-19 04/2020   per pt mild  symptoms that resolve   History of kidney stones  History of non-ST elevation myocardial infarction (NSTEMI) 01/27/2016   Hyperlipidemia    diet controlled   NAFLD (nonalcoholic fatty liver disease)    Neuropathy, peripheral 04/01/2015   OA (osteoarthritis)    Paroxysmal A-fib ([place])    08/19/19 afib with RVR, spontaneously converted to SR   Portal vein thrombosis    per dr [name]  ,  chronic non-occluded   Thrombocytopenia ([place])    followed by pcp  and hematology-- dr Burr Medico;   likely secondary to liver cirrhosis   Type 2 diabetes mellitus treated with insulin ([place])    followed by pcp    (07-21-2021   per pt checks blood sugar dialy in am fasting average 113--200s)   Urge incontinence of urine    Wears hearing aid in both ears    Past Surgical History:  Procedure Laterality Date   CARDIAC CATHETERIZATION N/A 01/27/2016   Procedure: Left Heart Cath and Coronary Angiography;  Surgeon: Belva Crome, MD;  Location: [facility];  Service: Cardiovascular;  Laterality: N/A;   COLONOSCOPY WITH PROPOFOL N/A 07/07/2016   Procedure: COLONOSCOPY WITH PROPOFOL;  Surgeon: [name], MD;  [facility];  Service: Endoscopy;  Laterality: N/A;   CORONARY ARTERY BYPASS GRAFT N/A 01/28/2016   Procedure: CORONARY ARTERY BYPASS GRAFTING (CABG) x 3 USING RIGHT LEG GREATER SAPHENOUS VEIN GRAFT;  Surgeon: Melrose Nakayama, MD;  Location: [facility];  Service: Open Heart Surgery;  Laterality: N/A;   CYSTOSCOPY WITH RETROGRADE PYELOGRAM, URETEROSCOPY AND STENT PLACEMENT Left 05/30/2021   Procedure: CYSTOSCOPY WITH RETROGRADE PYELOGRAM, URETEROSCOPY AND STENT PLACEMENT;  Surgeon: Ceasar Mons, MD;  Location: [facility];  Service: Urology;  Laterality: Left;   CYSTOSCOPY/URETEROSCOPY/HOLMIUM LASER/STENT PLACEMENT Left 07/26/2021   Procedure: CYSTOSCOPY/RETROGRADE/URETEROSCOPY/HOLMIUM LASER/STENT EXCHANGE;  Surgeon: Janith Lima, MD;  [facility];  Service: Urology;  Laterality: Left;   ENDOVEIN HARVEST OF GREATER SAPHENOUS VEIN Right 01/28/2016   Procedure: ENDOVEIN HARVEST OF GREATER SAPHENOUS VEIN;  Surgeon: Melrose Nakayama, MD;  Location: [facility];  Service: Open Heart Surgery;  Laterality: Right;   ESOPHAGEAL BANDING  03/28/2019   Procedure: ESOPHAGEAL BANDING;  Surgeon: [name], MD;  [facility];  Service: Endoscopy;;   ESOPHAGEAL BANDING  04/07/2019   Procedure: ESOPHAGEAL BANDING;  Surgeon: Juanita Craver, MD;  Location[facility];  Service: Endoscopy;;   ESOPHAGOGASTRODUODENOSCOPY N/A 04/07/2019   Procedure:  ESOPHAGOGASTRODUODENOSCOPY (EGD);  Surgeon: Juanita Craver, MD;  Location: [facility];  Service: Endoscopy;  Laterality: N/A;   ESOPHAGOGASTRODUODENOSCOPY (EGD) WITH PROPOFOL N/A 07/07/2016   Procedure: ESOPHAGOGASTRODUODENOSCOPY (EGD) WITH PROPOFOL;  Surgeon: [name], MD;  [facility];  Service: Endoscopy;  Laterality: N/A;   ESOPHAGOGASTRODUODENOSCOPY (EGD) WITH PROPOFOL N/A 03/28/2019   Procedure: ESOPHAGOGASTRODUODENOSCOPY (EGD) WITH PROPOFOL;  Surgeon: [name], MD;  [facility];  Service: Endoscopy;  Laterality: N/A;   EXTRACORPOREAL SHOCK WAVE LITHOTRIPSY  2010   HEMOSTASIS CLIP PLACEMENT  04/07/2019   Procedure: HEMOSTASIS CLIP PLACEMENT;  Surgeon: Juanita Craver, MD;  Location: [facility];  Service: Endoscopy;;   IR ANGIOGRAM SELECTIVE EACH ADDITIONAL VESSEL  04/08/2019   IR EMBO ART  VEN HEMORR LYMPH EXTRAV  INC GUIDE ROADMAPPING  04/08/2019   IR PARACENTESIS  04/08/2019   IR RADIOLOGIST EVAL & MGMT  06/13/2019   IR RADIOLOGIST EVAL & MGMT  09/25/2019   IR RADIOLOGIST EVAL & MGMT  07/07/2020   IR RADIOLOGIST EVAL & MGMT  07/30/2021   IR TIPS  04/08/2019  MAXIMUM ACCESS (MAS)POSTERIOR LUMBAR INTERBODY FUSION (PLIF) 1 LEVEL Left 06/10/2015   Procedure: FOR MAXIMUM ACCESS (MAS) POSTERIOR LUMBAR INTERBODY FUSION (PLIF) LUMBAR THREE-FOUR EXTRAFORAMINAL MICRODISCECTOMY LUMBAR FIVE-SACRAL ONE LEFT;  Surgeon: Eustace Moore, MD;  Location: [facility];  Service: Neurosurgery;  Laterality: Left;   RADIOLOGY WITH ANESTHESIA N/A 04/08/2019   Procedure: RADIOLOGY WITH ANESTHESIA;  Surgeon: Radiologist, Medication, MD;  Location: [facility];  Service: Radiology;  Laterality: N/A;   SCLEROTHERAPY  04/07/2019   Procedure: SCLEROTHERAPY;  Surgeon: Juanita Craver, MD;  [facility];  Service: Endoscopy;;   TEE WITHOUT CARDIOVERSION N/A 01/28/2016   Procedure: TRANSESOPHAGEAL ECHOCARDIOGRAM (TEE);  Surgeon: Melrose Nakayama, MD;  Location: [facility];  Service:  Open Heart Surgery;  Laterality: N/A;   TUBAL LIGATION  1982   Dr Lynwood Dawley HERNIA REPAIR N/A 09/10/2019   Procedure: HERNIA REPAIR UMBILICAL ADULT;  Surgeon: Donnie Mesa, MD;  Location: [facility];  Service: General;  Laterality: N/A;   VAGINAL HYSTERECTOMY  1997   Dr Rande Lawman   Social History:   reports that she has never smoked. She has never used smokeless tobacco. She reports that she does not drink alcohol and does not use drugs.  Family History  Problem Relation Age of Onset   Heart disease Mother    Breast cancer Mother    Lung cancer Father    Arthritis Sister    Arthritis Brother    Liver cancer Brother    Breast cancer Maternal Aunt    Breast cancer Paternal Aunt    Heart disease Maternal Grandmother    Heart disease Maternal Grandfather    Heart disease Paternal Grandmother    Heart disease Paternal Grandfather    Other Daughter        house fire   Colon cancer Neg Hx    Esophageal cancer Neg Hx    Rectal cancer Neg Hx    Stomach cancer Neg Hx     Medications: Patient's Medications  New Prescriptions   No medications on file  Previous Medications   ACETAMINOPHEN (TYLENOL) 500 MG TABLET    Take 1,000 mg by mouth every 6 (six) hours.   BIOTIN 16109 MCG TABS    Take 10,000 mcg by mouth daily.   CALCIUM CARB-CHOLECALCIFEROL (CALCIUM 600 + D PO)    Take 1 tablet by mouth in the morning and at bedtime.   CHOLECALCIFEROL (VITAMIN D-3 PO)    Take 1,000 Units by mouth daily.   CYANOCOBALAMIN (VITAMIN B 12 PO)    Take 1,000 mcg by mouth daily.   EMPAGLIFLOZIN (JARDIANCE) 25 MG TABS TABLET    Take 1 tablet (25 mg total) by mouth daily.   EZETIMIBE (ZETIA) 10 MG TABLET    Take 1 tablet (10 mg total) by mouth daily.   FLUTICASONE (FLONASE) 50 MCG/ACT NASAL SPRAY    Place 1 spray into both nostrils as needed for allergies or rhinitis.   FUROSEMIDE (LASIX) 20 MG TABLET    TAKE 1 TABLET(20 MG) BY MOUTH DAILY   GLUCOSE BLOOD TEST STRIP    One Touch Ultra strips. Use to  test blood sugar three times daily. Dx: E11.65   INSULIN DETEMIR (LEVEMIR FLEXTOUCH) 100 UNIT/ML FLEXPEN    Inject 25 Units into the skin at bedtime.   INSULIN PEN NEEDLE (BD PEN NEEDLE NANO U/F) 32G X 4 MM MISC    USE THREE TIMES DAILY AS DIRECTED   INSULIN SYRINGE-NEEDLE U-100 (INSULIN SYRINGE 1CC/31GX5/16") 31G X 5/16" 1  ML MISC    USE AS DIRECTED TO INJECT LEVIMIR   LACTULOSE (CHRONULAC) 10 GM/15ML SOLUTION    Take 30 mLs (20 g total) by mouth daily.   LORAZEPAM (ATIVAN) 0.5 MG TABLET    Take 1 tablet (0.5 mg total) by mouth 2 (two) times daily as needed for anxiety.   MAGNESIUM PO    Take 500 mg by mouth daily.    METOPROLOL TARTRATE (LOPRESSOR) 25 MG TABLET    Take 1 tablet (25 mg total) by mouth as needed (use as needed for HR greater than 110).   MULTIPLE VITAMINS-MINERALS (MULTIVITAMIN WITH MINERALS) TABLET    Take 1 tablet by mouth daily.   PANTOPRAZOLE (PROTONIX) 40 MG TABLET    TAKE 1 TABLET(40 MG) BY MOUTH DAILY   POLYETHYL GLYCOL-PROPYL GLYCOL (SYSTANE OP)    Place 1 drop into both eyes at bedtime.   PROBIOTIC PRODUCT (PROBIOTIC DAILY PO)    Take 1 capsule by mouth daily. Digestive Advantage Probiotic   XIFAXAN 550 MG TABS TABLET    Take 1 tablet (550 mg total) by mouth 2 (two) times daily.  Modified Medications   No medications on file  Discontinued Medications   No medications on file    Physical Exam:  Vitals:   10/05/21 1337  BP: 110/70  Pulse: 62  Resp: 18  Temp: (!) 97.3 F (36.3 C)  TempSrc: Tympanic  SpO2: 99%  Weight: 129 lb (58.5 kg)  Height: 5' 3"  (1.6 m)   Body mass index is 22.85 kg/m. Wt Readings from Last 3 Encounters:  10/05/21 129 lb (58.5 kg)  07/26/21 126 lb 3.2 oz (57.2 kg)  07/16/21 126 lb (57.2 kg)    Physical Exam Vitals and nursing note reviewed.  Constitutional:      Appearance: Normal appearance.  Cardiovascular:     Rate and Rhythm: Normal rate and regular rhythm.  Pulmonary:     Effort: Pulmonary effort is normal.     Breath  sounds: Normal breath sounds.  Musculoskeletal:     Comments: Strength is appropriate in lower extremities but there is difference in knee jerk patellar reflex  Neurological:     General: No focal deficit present.     Mental Status: She is alert and oriented to person, place, and time.    Labs reviewed: Basic Metabolic Panel: Recent Labs    10/13/20 0843 03/11/21 1246 06/01/21 0453 06/08/21 1140 06/15/21 0904 07/26/21 1302  NA 143   < > 139 137 140 145  K 3.7   < > 3.6 4.3 3.8 3.6  CL 107   < > 110 101 104 107  CO2 26   < > 24 30 29   --   GLUCOSE 93   < > 105* 341* 98 126*  BUN 16   < > 20 19 16 15   CREATININE 0.79   < > 0.81 0.83 0.71 0.50  CALCIUM 10.0   < > 8.3* 10.0 9.2  --   TSH 2.78  --   --   --   --   --    < > = values in this interval not displayed.   Liver Function Tests: Recent Labs    05/29/21 1842 05/30/21 0417 06/15/21 0904  AST 90* 63* 40*  ALT 51* 44 33  ALKPHOS 112 94 90  BILITOT 2.1* 2.3* 2.1*  PROT 7.0 6.6 6.8  ALBUMIN 3.0* 2.7* 3.3*   Recent Labs    03/11/21 1246 05/29/21 1842  LIPASE 26 28  No results for input(s): AMMONIA in the last 8760 hours. CBC: Recent Labs    10/13/20 0843 03/11/21 1246 06/01/21 0453 06/08/21 1140 06/15/21 0904 07/26/21 1302  WBC 2.8*   < > 6.3 4.6 4.2  --   NEUTROABS 1,481*  --   --  2,907  --   --   HGB 13.5   < > 11.7* 13.9 13.1 13.6  HCT 40.8   < > 35.4* 41.5 38.9 40.0  MCV 91.9   < > 93.4 94.7 93.3  --   PLT 82*   < > 93* 109* 97.0*  --    < > = values in this interval not displayed.   Lipid Panel: Recent Labs    12/15/20 0950  CHOL 168  HDL 85  LDLCALC 70  TRIG 71  CHOLHDL 2.0   TSH: Recent Labs    10/13/20 0843  TSH 2.78   A1C: Lab Results  Component Value Date   HGBA1C 9.6 (H) 05/30/2021     Assessment/Plan  1. Coronary artery disease involving native coronary artery of native heart without angina pectoris Stable.  No recent chest pain  2. Cirrhosis of liver with  ascites, unspecified hepatic cirrhosis type ([place]) Followed by GI.  Takes Xifaxan as well as lactulose for her liver disease  3. Type 2 diabetes mellitus with other circulatory complication, with long-term current use of insulin (HCC) Continue with Jardiance and long-acting insulin we will repeat A1c  4. Other hyperlipidemia Had rhabdomyolysis on statin.  Takes only Zetia and lipids are well controlled on that and diet   Alain Honey, MD [place] [phone]

## 2021-10-08 ENCOUNTER — Encounter: Payer: Self-pay | Admitting: Gastroenterology

## 2021-10-11 ENCOUNTER — Other Ambulatory Visit: Payer: Self-pay | Admitting: *Deleted

## 2021-10-11 MED ORDER — BD PEN NEEDLE SHORT U/F 31G X 8 MM MISC: 3 refills | Status: DC

## 2021-10-11 NOTE — Telephone Encounter (Signed)
Pharmacy requested refill

## 2021-10-14 ENCOUNTER — Other Ambulatory Visit: Payer: Self-pay | Admitting: Gastroenterology

## 2021-10-19 ENCOUNTER — Encounter: Payer: Self-pay | Admitting: Dermatology

## 2021-10-19 ENCOUNTER — Ambulatory Visit (INDEPENDENT_AMBULATORY_CARE_PROVIDER_SITE_OTHER): Payer: Medicare Other | Admitting: Dermatology

## 2021-10-19 ENCOUNTER — Ambulatory Visit (INDEPENDENT_AMBULATORY_CARE_PROVIDER_SITE_OTHER): Payer: Medicare Other | Admitting: *Deleted

## 2021-10-19 DIAGNOSIS — I2510 Atherosclerotic heart disease of native coronary artery without angina pectoris: Secondary | ICD-10-CM | POA: Diagnosis not present

## 2021-10-19 DIAGNOSIS — Q828 Other specified congenital malformations of skin: Secondary | ICD-10-CM | POA: Diagnosis not present

## 2021-10-19 DIAGNOSIS — L738 Other specified follicular disorders: Secondary | ICD-10-CM | POA: Diagnosis not present

## 2021-10-19 DIAGNOSIS — M810 Age-related osteoporosis without current pathological fracture: Secondary | ICD-10-CM | POA: Diagnosis not present

## 2021-10-19 DIAGNOSIS — D239 Other benign neoplasm of skin, unspecified: Secondary | ICD-10-CM

## 2021-10-19 DIAGNOSIS — Z1283 Encounter for screening for malignant neoplasm of skin: Secondary | ICD-10-CM

## 2021-10-19 DIAGNOSIS — D2339 Other benign neoplasm of skin of other parts of face: Secondary | ICD-10-CM

## 2021-10-19 DIAGNOSIS — L570 Actinic keratosis: Secondary | ICD-10-CM

## 2021-10-19 MED ORDER — DENOSUMAB 60 MG/ML ~~LOC~~ SOSY
60.0000 mg | PREFILLED_SYRINGE | Freq: Once | SUBCUTANEOUS | Status: AC
  Administered 2021-10-19: 60 mg via SUBCUTANEOUS

## 2021-10-19 NOTE — Patient Instructions (Signed)

## 2021-10-21 ENCOUNTER — Encounter: Payer: Self-pay | Admitting: Nurse Practitioner

## 2021-10-21 ENCOUNTER — Ambulatory Visit (INDEPENDENT_AMBULATORY_CARE_PROVIDER_SITE_OTHER): Payer: Medicare Other | Admitting: Nurse Practitioner

## 2021-10-21 ENCOUNTER — Other Ambulatory Visit: Payer: Self-pay | Admitting: Family

## 2021-10-21 VITALS — BP 112/70 | HR 67 | Temp 97.8°F | Resp 16 | Ht 63.0 in | Wt 127.6 lb

## 2021-10-21 DIAGNOSIS — I2510 Atherosclerotic heart disease of native coronary artery without angina pectoris: Secondary | ICD-10-CM | POA: Diagnosis not present

## 2021-10-21 DIAGNOSIS — R300 Dysuria: Secondary | ICD-10-CM | POA: Diagnosis not present

## 2021-10-21 DIAGNOSIS — R350 Frequency of micturition: Secondary | ICD-10-CM

## 2021-10-21 DIAGNOSIS — M5416 Radiculopathy, lumbar region: Secondary | ICD-10-CM | POA: Diagnosis not present

## 2021-10-21 DIAGNOSIS — N951 Menopausal and female climacteric states: Secondary | ICD-10-CM

## 2021-10-21 LAB — POCT URINALYSIS DIPSTICK
Bilirubin, UA: NEGATIVE
Glucose, UA: POSITIVE — AB
Ketones, UA: POSITIVE
Nitrite, UA: POSITIVE
Protein, UA: NEGATIVE
Spec Grav, UA: 1.015 (ref 1.010–1.025)
Urobilinogen, UA: NEGATIVE E.U./dL — AB
pH, UA: 6 (ref 5.0–8.0)

## 2021-10-21 MED ORDER — AMOXICILLIN-POT CLAVULANATE 875-125 MG PO TABS: 1.0000 | ORAL_TABLET | Freq: Two times a day (BID) | ORAL | 0 refills | Status: DC

## 2021-10-21 NOTE — Telephone Encounter (Signed)
Patient requesting refill sent to PCP for authorization

## 2021-10-21 NOTE — Patient Instructions (Signed)
Take antibiotic twice daily with full glass of water- and food Take probiotic twice daily while on antibiotic Increase water intake

## 2021-10-21 NOTE — Progress Notes (Signed)
Careteam: Patient Care Team: [name], MD as PCP - General (Family Medicine) Josue Hector, MD as PCP - Cardiology (Cardiology) Berle Mull, MD as Consulting Physician (Family Medicine) Sharmon Revere as Physician Assistant (Cardiology) Calvert Cantor, MD as Consulting Physician (Ophthalmology) [name], MD as Attending Physician (Gastroenterology) Lavonna Monarch, MD as Consulting Physician (Dermatology)  PLACE OF SERVICE:  [employer] Directive information Does Patient Have a Medical Advance Directive?: Yes, Type of Advance Directive: [place];Living will, Does patient want to make changes to medical advance directive?: No - Patient declined  Allergies  Allergen Reactions   Kiwi Extract Anaphylaxis   Tdap [Tetanus-Diphth-Acell Pertussis] Swelling and Other (See Comments)    Swelling at injection site, gets very hot   Statins Other (See Comments)    RHABDOMYOLYSIS   Latex Itching, Dermatitis and Rash   Tramadol Nausea And Vomiting    Chief Complaint  Patient presents with   Acute Visit    Patient complains of possible UTI.     HPI: Patient is a 76 y.o. female due to burning with urination. Started last night. Very painful.  Took tylenol today to help.  Increase frequency and urgency and not making it to the restroom.  She has noticed blood in urine- minimal. No fever.   Review of Systems:  Review of Systems  Constitutional:  Negative for chills and fever.  Genitourinary:  Positive for dysuria and urgency.  Musculoskeletal:  Negative for myalgias.    Past Medical History:  Diagnosis Date   Chronic cystitis    Chronic diastolic CHF (congestive heart failure) ([place])    followed by dr Johnsie Cancel   Chronic kidney disease, stage I    Cirrhosis of liver without ascites ([place]) 03/2016   followed by dr [name] (GI);   due to fatty liver;   post op cabg 09/ 2017  , post paracentesis for ascites   Coronary artery  disease 01/2016   cardiologist--- dr Frances Nickels;   01-27-2016  NSTEMI  s/p cardiac cath;   s/p CABG 01/29/16: LIMA-LAD, SVG-RI, SVG-dRCA   DDD (degenerative disc disease), cervical    per pt w/ chronic neck pain   Edema of right lower extremity    Esophageal varices ([place])    followed by dr [name];   hx multiple banding of large varices   Essential hypertension    no meds   GERD (gastroesophageal reflux disease)    Heart murmur     DX FOR YEARS ASYMPTOMATIC   Hepatic encephalopathy ([place])    History of adenomatous polyp of colon    History of COVID-19 04/2020   per pt mild  symptoms that resolve   History of kidney stones    History of non-ST elevation myocardial infarction (NSTEMI) 01/27/2016   Hyperlipidemia    diet controlled   NAFLD (nonalcoholic fatty liver disease)    Neuropathy, peripheral 04/01/2015   OA (osteoarthritis)    Paroxysmal A-fib ([place])    08/19/19 afib with RVR, spontaneously converted to SR   Portal vein thrombosis    per dr [name]  ,  chronic non-occluded   Thrombocytopenia ([place])    followed by pcp  and hematology-- dr Burr Medico;   likely secondary to liver cirrhosis   Type 2 diabetes mellitus treated with insulin ([place])    followed by pcp    (07-21-2021  per pt checks blood sugar dialy in am fasting average 113--200s)   Urge incontinence of urine  Wears hearing aid in both ears    Past Surgical History:  Procedure Laterality Date   CARDIAC CATHETERIZATION N/A 01/27/2016   Procedure: Left Heart Cath and Coronary Angiography;  Surgeon: Belva Crome, MD;  Location: [place] [facility];  Service: Cardiovascular;  Laterality: N/A;   COLONOSCOPY WITH PROPOFOL N/A 07/07/2016   Procedure: COLONOSCOPY WITH PROPOFOL;  Surgeon: [name], MD;  Location: [facility];  Service: Endoscopy;  Laterality: N/A;   CORONARY ARTERY BYPASS GRAFT N/A 01/28/2016   Procedure: CORONARY ARTERY BYPASS GRAFTING (CABG) x 3 USING RIGHT LEG GREATER SAPHENOUS VEIN GRAFT;  Surgeon: Melrose Nakayama, MD;  Location: [place];  Service: Open Heart Surgery;  Laterality: N/A;   CYSTOSCOPY WITH RETROGRADE PYELOGRAM, URETEROSCOPY AND STENT PLACEMENT Left 05/30/2021   Procedure: CYSTOSCOPY WITH RETROGRADE PYELOGRAM, URETEROSCOPY AND STENT PLACEMENT;  Surgeon: Ceasar Mons, MD;  Location: [place];  Service: Urology;  Laterality: Left;   CYSTOSCOPY/URETEROSCOPY/HOLMIUM LASER/STENT PLACEMENT Left 07/26/2021   Procedure: CYSTOSCOPY/RETROGRADE/URETEROSCOPY/HOLMIUM LASER/STENT EXCHANGE;  Surgeon: Janith Lima, MD;  Location: [facility];  Service: Urology;  Laterality: Left;   ENDOVEIN HARVEST OF GREATER SAPHENOUS VEIN Right 01/28/2016   Procedure: ENDOVEIN HARVEST OF GREATER SAPHENOUS VEIN;  Surgeon: Melrose Nakayama, MD;  Location: [place];  Service: Open Heart Surgery;  Laterality: Right;   ESOPHAGEAL BANDING  03/28/2019   Procedure: ESOPHAGEAL BANDING;  Surgeon: [name], MD;  Location: [facility];  Service: Endoscopy;;   ESOPHAGEAL BANDING  04/07/2019   Procedure: ESOPHAGEAL BANDING;  Surgeon: Juanita Craver, MD;  Location[facility];  Service: Endoscopy;;   ESOPHAGOGASTRODUODENOSCOPY N/A 04/07/2019   Procedure: ESOPHAGOGASTRODUODENOSCOPY (EGD);  Surgeon: Juanita Craver, MD;  Location: [facility] ENDOSCOPY;  Service: Endoscopy;  Laterality: N/A;   ESOPHAGOGASTRODUODENOSCOPY (EGD) WITH PROPOFOL N/A 07/07/2016   Procedure: ESOPHAGOGASTRODUODENOSCOPY (EGD) WITH PROPOFOL;  Surgeon: [name], MD;  Location: [facility];  Service: Endoscopy;  Laterality: N/A;   ESOPHAGOGASTRODUODENOSCOPY (EGD) WITH PROPOFOL N/A 03/28/2019   Procedure: ESOPHAGOGASTRODUODENOSCOPY (EGD) WITH PROPOFOL;  Surgeon: [name], MD;  Location: [facility];  Service: Endoscopy;  Laterality: N/A;   EXTRACORPOREAL SHOCK WAVE LITHOTRIPSY  2010   HEMOSTASIS CLIP PLACEMENT  04/07/2019   Procedure: HEMOSTASIS CLIP PLACEMENT;  Surgeon: Juanita Craver, MD;  Location: [place];   Service: Endoscopy;;   IR ANGIOGRAM SELECTIVE EACH ADDITIONAL VESSEL  04/08/2019   IR EMBO ART  VEN HEMORR LYMPH EXTRAV  INC GUIDE ROADMAPPING  04/08/2019   IR PARACENTESIS  04/08/2019   IR RADIOLOGIST EVAL & MGMT  06/13/2019   IR RADIOLOGIST EVAL & MGMT  09/25/2019   IR RADIOLOGIST EVAL & MGMT  07/07/2020   IR RADIOLOGIST EVAL & MGMT  07/30/2021   IR TIPS  04/08/2019   MAXIMUM ACCESS (MAS)POSTERIOR LUMBAR INTERBODY FUSION (PLIF) 1 LEVEL Left 06/10/2015   Procedure: FOR MAXIMUM ACCESS (MAS) POSTERIOR LUMBAR INTERBODY FUSION (PLIF) LUMBAR THREE-FOUR EXTRAFORAMINAL MICRODISCECTOMY LUMBAR FIVE-SACRAL ONE LEFT;  Surgeon: Eustace Moore, MD;  Location: [facility];  Service: Neurosurgery;  Laterality: Left;   RADIOLOGY WITH ANESTHESIA N/A 04/08/2019   Procedure: RADIOLOGY WITH ANESTHESIA;  Surgeon: Radiologist, Medication, MD;  Location: [place];  Service: Radiology;  Laterality: N/A;   SCLEROTHERAPY  04/07/2019   Procedure: SCLEROTHERAPY;  Surgeon: Juanita Craver, MD;  Location[facility] ENDOSCOPY;  Service: Endoscopy;;   TEE WITHOUT CARDIOVERSION N/A 01/28/2016   Procedure: TRANSESOPHAGEAL ECHOCARDIOGRAM (TEE);  Surgeon: Melrose Nakayama, MD;  Location: [place];  Service: Open Heart Surgery;  Laterality:  N/A;   TUBAL LIGATION  1982   Dr Lynwood Dawley HERNIA REPAIR N/A 09/10/2019   Procedure: HERNIA REPAIR UMBILICAL ADULT;  Surgeon: Donnie Mesa, MD;  Location: [place];  Service: General;  Laterality: N/A;   VAGINAL HYSTERECTOMY  1997   Dr Rande Lawman   Social History:   reports that she has never smoked. She has never used smokeless tobacco. She reports that she does not drink alcohol and does not use drugs.  Family History  Problem Relation Age of Onset   Heart disease Mother    Breast cancer Mother    Lung cancer Father    Arthritis Sister    Arthritis Brother    Liver cancer Brother    Breast cancer Maternal Aunt    Breast cancer Paternal Aunt    Heart disease Maternal Grandmother     Heart disease Maternal Grandfather    Heart disease Paternal Grandmother    Heart disease Paternal Grandfather    Other Daughter        house fire   Colon cancer Neg Hx    Esophageal cancer Neg Hx    Rectal cancer Neg Hx    Stomach cancer Neg Hx     Medications: Patient's Medications  New Prescriptions   No medications on file  Previous Medications   ACETAMINOPHEN (TYLENOL) 500 MG TABLET    Take 1,000 mg by mouth every 6 (six) hours.   BIOTIN 95284 MCG TABS    Take 10,000 mcg by mouth daily.   CALCIUM CARB-CHOLECALCIFEROL (CALCIUM 600 + D PO)    Take 1 tablet by mouth in the morning and at bedtime.   CHOLECALCIFEROL (VITAMIN D-3 PO)    Take 1,000 Units by mouth daily.   CYANOCOBALAMIN (VITAMIN B 12 PO)    Take 1,000 mcg by mouth daily.   EMPAGLIFLOZIN (JARDIANCE) 25 MG TABS TABLET    Take 1 tablet (25 mg total) by mouth daily.   EZETIMIBE (ZETIA) 10 MG TABLET    Take 1 tablet (10 mg total) by mouth daily.   FLUTICASONE (FLONASE) 50 MCG/ACT NASAL SPRAY    Place 1 spray into both nostrils as needed for allergies or rhinitis.   FUROSEMIDE (LASIX) 20 MG TABLET    TAKE 1 TABLET(20 MG) BY MOUTH DAILY   GLUCOSE BLOOD TEST STRIP    One Touch Ultra strips. Use to test blood sugar three times daily. Dx: E11.65   INSULIN DETEMIR (LEVEMIR FLEXTOUCH) 100 UNIT/ML FLEXPEN    Inject 25 Units into the skin at bedtime.   INSULIN PEN NEEDLE (B-D ULTRAFINE III SHORT PEN) 31G X 8 MM MISC    Use to give Insulin injections daily. Dx: E11.65   INSULIN SYRINGE-NEEDLE U-100 (INSULIN SYRINGE 1CC/31GX5/16") 31G X 5/16" 1 ML MISC    USE AS DIRECTED TO INJECT LEVIMIR   LACTULOSE (CHRONULAC) 10 GM/15ML SOLUTION    TAKE 30 MLS BY MOUTH EVERY DAY   LORAZEPAM (ATIVAN) 0.5 MG TABLET    Take 1 tablet (0.5 mg total) by mouth 2 (two) times daily as needed for anxiety.   MAGNESIUM PO    Take 500 mg by mouth daily.    METOPROLOL TARTRATE (LOPRESSOR) 25 MG TABLET    Take 1 tablet (25 mg total) by mouth as needed (use as  needed for HR greater than 110).   MULTIPLE VITAMINS-MINERALS (MULTIVITAMIN WITH MINERALS) TABLET    Take 1 tablet by mouth daily.   PANTOPRAZOLE (PROTONIX) 40 MG TABLET    TAKE  1 TABLET(40 MG) BY MOUTH DAILY   POLYETHYL GLYCOL-PROPYL GLYCOL (SYSTANE OP)    Place 1 drop into both eyes at bedtime.   PROBIOTIC PRODUCT (PROBIOTIC DAILY PO)    Take 1 capsule by mouth daily. Digestive Advantage Probiotic   XIFAXAN 550 MG TABS TABLET    Take 1 tablet (550 mg total) by mouth 2 (two) times daily.  Modified Medications   No medications on file  Discontinued Medications   No medications on file    Physical Exam:  Vitals:   10/21/21 1440  BP: 112/70  Pulse: 67  Resp: 16  Temp: 97.8 F (36.6 C)  SpO2: 97%  Weight: 127 lb 9.6 oz (57.9 kg)  Height: 5' 3"  (1.6 m)   Body mass index is 22.6 kg/m. Wt Readings from Last 3 Encounters:  10/21/21 127 lb 9.6 oz (57.9 kg)  10/05/21 129 lb (58.5 kg)  07/26/21 126 lb 3.2 oz (57.2 kg)    Physical Exam Constitutional:      Appearance: Normal appearance.  Pulmonary:     Effort: Pulmonary effort is normal.  Abdominal:     General: There is no distension.     Tenderness: There is no abdominal tenderness. There is no right CVA tenderness, left CVA tenderness or guarding.  Neurological:     Mental Status: She is alert. Mental status is at baseline.  Psychiatric:        Mood and Affect: Mood normal.     Labs reviewed: Basic Metabolic Panel: Recent Labs    06/01/21 0453 06/08/21 1140 06/15/21 0904 07/26/21 1302  NA 139 137 140 145  K 3.6 4.3 3.8 3.6  CL 110 101 104 107  CO2 24 30 29   --   GLUCOSE 105* 341* 98 126*  BUN 20 19 16 15   CREATININE 0.81 0.83 0.71 0.50  CALCIUM 8.3* 10.0 9.2  --    Liver Function Tests: Recent Labs    05/29/21 1842 05/30/21 0417 06/15/21 0904  AST 90* 63* 40*  ALT 51* 44 33  ALKPHOS 112 94 90  BILITOT 2.1* 2.3* 2.1*  PROT 7.0 6.6 6.8  ALBUMIN 3.0* 2.7* 3.3*   Recent Labs    03/11/21 1246  05/29/21 1842  LIPASE 26 28   No results for input(s): "AMMONIA" in the last 8760 hours. CBC: Recent Labs    06/01/21 0453 06/08/21 1140 06/15/21 0904 07/26/21 1302  WBC 6.3 4.6 4.2  --   NEUTROABS  --  2,907  --   --   HGB 11.7* 13.9 13.1 13.6  HCT 35.4* 41.5 38.9 40.0  MCV 93.4 94.7 93.3  --   PLT 93* 109* 97.0*  --    Lipid Panel: Recent Labs    12/15/20 0950  CHOL 168  HDL 85  LDLCALC 70  TRIG 71  CHOLHDL 2.0   TSH: No results for input(s): "TSH" in the last 8760 hours. A1C: Lab Results  Component Value Date   HGBA1C 9.6 (H) 05/30/2021     Assessment/Plan 1. Dysuria - POC Urinalysis Dipstick-showing positive nitrate, large leukocytes and blood. Will send for culture and treat due to symptoms. -continue tylenol PRN pain -push fluids  - Urine Culture - amoxicillin-clavulanate (AUGMENTIN) 875-125 MG tablet; Take 1 tablet by mouth 2 (two) times daily.  Dispense: 14 tablet; Refill: 0  2. Urinary frequency - POC Urinalysis Dipstick - Urine Culture - amoxicillin-clavulanate (AUGMENTIN) 875-125 MG tablet; Take 1 tablet by mouth 2 (two) times daily.  Dispense: 14 tablet; Refill: 0  Carlos American. [place], [facility] [phone]

## 2021-10-22 LAB — URINE CULTURE
MICRO NUMBER:: 13531839
SPECIMEN QUALITY:: ADEQUATE

## 2021-10-25 ENCOUNTER — Other Ambulatory Visit: Payer: Self-pay | Admitting: *Deleted

## 2021-10-25 DIAGNOSIS — N951 Menopausal and female climacteric states: Secondary | ICD-10-CM

## 2021-10-25 MED ORDER — PANTOPRAZOLE SODIUM 40 MG PO TBEC: DELAYED_RELEASE_TABLET | ORAL | 3 refills | Status: DC

## 2021-10-25 MED ORDER — LORAZEPAM 0.5 MG PO TABS: 0.5000 mg | ORAL_TABLET | Freq: Two times a day (BID) | ORAL | 1 refills | Status: DC | PRN

## 2021-10-25 NOTE — Telephone Encounter (Signed)
Pharmacy requested refill.  Epic LR: 08/19/2021 Contract Date: added note to upcoming appointment.  Pended Rx and sent to [facility] for approval due to Dr. Sabra Heck not in office.

## 2021-10-25 NOTE — Telephone Encounter (Signed)
Pharmacy requested refill

## 2021-10-26 DIAGNOSIS — M5416 Radiculopathy, lumbar region: Secondary | ICD-10-CM | POA: Diagnosis not present

## 2021-10-28 ENCOUNTER — Telehealth: Payer: Self-pay | Admitting: *Deleted

## 2021-10-28 ENCOUNTER — Other Ambulatory Visit (INDEPENDENT_AMBULATORY_CARE_PROVIDER_SITE_OTHER): Payer: Medicare Other

## 2021-10-28 DIAGNOSIS — R350 Frequency of micturition: Secondary | ICD-10-CM

## 2021-10-28 DIAGNOSIS — R3129 Other microscopic hematuria: Secondary | ICD-10-CM

## 2021-10-28 LAB — POCT URINALYSIS DIPSTICK
Bilirubin, UA: NEGATIVE
Glucose, UA: POSITIVE — AB
Ketones, UA: NEGATIVE
Leukocytes, UA: NEGATIVE
Nitrite, UA: NEGATIVE
Protein, UA: NEGATIVE
Spec Grav, UA: <=1.005 — AB (ref 1.010–1.025)
Urobilinogen, UA: 0.2 E.U./dL
pH, UA: 6 (ref 5.0–8.0)

## 2021-10-28 NOTE — Telephone Encounter (Signed)
Received fax from [name] patient Assistance Program [phone] Requesting form to be filled out for patient's NovoFine 32G Tip Needles.   Filled out form and placed in Dr. Ammie Ferrier folder to review and sign. Attached Medication list.   To be faxed to Fax: [phone] once completed.

## 2021-11-01 ENCOUNTER — Telehealth: Payer: Self-pay | Admitting: Hematology

## 2021-11-02 ENCOUNTER — Ambulatory Visit: Payer: Medicare Other | Admitting: Dermatology

## 2021-11-03 DIAGNOSIS — N3000 Acute cystitis without hematuria: Secondary | ICD-10-CM | POA: Diagnosis not present

## 2021-11-03 DIAGNOSIS — Z87442 Personal history of urinary calculi: Secondary | ICD-10-CM | POA: Diagnosis not present

## 2021-11-03 NOTE — Progress Notes (Deleted)
[facility] OFFICE PROGRESS NOTE  [name], MD [place] [address]  DIAGNOSIS: Thrombocytopenia and Neutropenia  CURRENT THERAPY: Observation   INTERVAL HISTORY: [name] 76 y.o. female returns to clinic today for follow-up visit.  The patient was last seen in clinic on 11/05/2020 by Dr. Burr Medico 1 year ago.  She is here today for a 1 year follow-up visit.  The patient follows with Dr. Burr Medico for thrombocytopenia and neutropenia which is felt to be secondary to her liver cirrhosis.  The patient is followed closely by Dr. [name] for cirrhosis management and liver screenings.  She recently had a repeat MRI of the liver in February.  She is currently on lactulose and Xifaxan for her liver.  The patient has a longstanding history per chart review of thrombocytopenia since 2016 with a platelet count near 100,000 which has been fairly stable for the last several years.  She denies any significant bleeding or easy bruising.  Dr. Burr Medico had previously discussed other etiologies including nutritional deficiencies or viral infections which she was tested for negative.  Dr. Burr Medico does not feel that a bone marrow biopsy and aspirate is indicated given the known cirrhosis and platelet count near 100,000 which is typical.  She is also followed by the clinic for neutropenia as she had mild leukopenia on lab draws intermittently.  Most the time her [place] has been within normal limits.  She denies any frequent infections.  Dr. Burr Medico had a low suspicious for any bone marrow disorders, leukemias, or lymphomas.  Overall the patient is doing fairly well today.  She denies any frequent infections.  Reports chronic fatigue.  Denies any unexplained weight loss.  Denies any fever, chills, night sweats, adenopathy, or ***.  Petechiae?  Upcoming surgeries?  She is here today for evaluation and repeat blood work      MEDICAL HISTORY: Past Medical History:  Diagnosis Date   Chronic  cystitis    Chronic diastolic CHF (congestive heart failure) ([place])    followed by dr Johnsie Cancel   Chronic kidney disease, stage I    Cirrhosis of liver without ascites ([place]) 03/2016   followed by dr [name] (GI);   due to fatty liver;   post op cabg 09/ 2017  , post paracentesis for ascites   Coronary artery disease 01/2016   cardiologist--- dr Frances Nickels;   01-27-2016  NSTEMI  s/p cardiac cath;   s/p CABG 01/29/16: LIMA-LAD, SVG-RI, SVG-dRCA   DDD (degenerative disc disease), cervical    per pt w/ chronic neck pain   Edema of right lower extremity    Esophageal varices ([place])    followed by dr [name];   hx multiple banding of large varices   Essential hypertension    no meds   GERD (gastroesophageal reflux disease)    Heart murmur     DX FOR YEARS ASYMPTOMATIC   Hepatic encephalopathy ([place])    History of adenomatous polyp of colon    History of COVID-19 04/2020   per pt mild  symptoms that resolve   History of kidney stones    History of non-ST elevation myocardial infarction (NSTEMI) 01/27/2016   Hyperlipidemia    diet controlled   NAFLD (nonalcoholic fatty liver disease)    Neuropathy, peripheral 04/01/2015   OA (osteoarthritis)    Paroxysmal A-fib ([place])    08/19/19 afib with RVR, spontaneously converted to SR   Portal vein thrombosis    per dr [name]  ,  chronic non-occluded   Thrombocytopenia ([facility])    followed by pcp  and hematology-- dr Burr Medico;   likely secondary to liver cirrhosis   Type 2 diabetes mellitus treated with insulin ([place])    followed by pcp    (07-21-2021  per pt checks blood sugar dialy in am fasting average 113--200s)   Urge incontinence of urine    Wears hearing aid in both ears     ALLERGIES:  is allergic to kiwi extract, tdap [tetanus-diphth-acell pertussis], statins, latex, and tramadol.  MEDICATIONS:  Current Outpatient Medications  Medication Sig Dispense Refill   acetaminophen (TYLENOL) 500 MG tablet Take 1,000 mg by mouth every 6 (six) hours.      amoxicillin-clavulanate (AUGMENTIN) 875-125 MG tablet Take 1 tablet by mouth 2 (two) times daily. 14 tablet 0   Biotin 10000 MCG TABS Take 10,000 mcg by mouth daily.     Calcium Carb-Cholecalciferol (CALCIUM 600 + D PO) Take 1 tablet by mouth in the morning and at bedtime.     Cholecalciferol (VITAMIN D-3 PO) Take 1,000 Units by mouth daily.     Cyanocobalamin (VITAMIN B 12 PO) Take 1,000 mcg by mouth daily.     empagliflozin (JARDIANCE) 25 MG TABS tablet Take 1 tablet (25 mg total) by mouth daily. 90 tablet 1   ezetimibe (ZETIA) 10 MG tablet Take 1 tablet (10 mg total) by mouth daily. 90 tablet 1   fluticasone (FLONASE) 50 MCG/ACT nasal spray Place 1 spray into both nostrils as needed for allergies or rhinitis.     furosemide (LASIX) 20 MG tablet TAKE 1 TABLET(20 MG) BY MOUTH DAILY 90 tablet 2   glucose blood test strip One Touch Ultra strips. Use to test blood sugar three times daily. Dx: E11.65 300 each 3   insulin detemir (LEVEMIR FLEXTOUCH) 100 UNIT/ML FlexPen Inject 25 Units into the skin at bedtime. 15 mL 3   Insulin Pen Needle (B-D ULTRAFINE III SHORT PEN) 31G X 8 MM MISC Use to give Insulin injections daily. Dx: E11.65 100 each 3   Insulin Syringe-Needle U-100 (INSULIN SYRINGE 1CC/31GX5/16") 31G X 5/16" 1 ML MISC USE AS DIRECTED TO INJECT LEVIMIR 100 each 2   lactulose (CHRONULAC) 10 GM/15ML solution TAKE 30 MLS BY MOUTH EVERY DAY 236 mL 4   LORazepam (ATIVAN) 0.5 MG tablet TAKE 1 TABLET(0.5 MG) BY MOUTH TWICE DAILY AS NEEDED FOR ANXIETY 30 tablet 1   LORazepam (ATIVAN) 0.5 MG tablet Take 1 tablet (0.5 mg total) by mouth 2 (two) times daily as needed for anxiety. 60 tablet 1   MAGNESIUM PO Take 500 mg by mouth daily.      metoprolol tartrate (LOPRESSOR) 25 MG tablet Take 1 tablet (25 mg total) by mouth as needed (use as needed for HR greater than 110). 60 tablet 2   Multiple Vitamins-Minerals (MULTIVITAMIN WITH MINERALS) tablet Take 1 tablet by mouth daily.     pantoprazole (PROTONIX) 40  MG tablet TAKE 1 TABLET(40 MG) BY MOUTH DAILY 90 tablet 3   Polyethyl Glycol-Propyl Glycol (SYSTANE OP) Place 1 drop into both eyes at bedtime.     Probiotic Product (PROBIOTIC DAILY PO) Take 1 capsule by mouth daily. Digestive Advantage Probiotic     XIFAXAN 550 MG TABS tablet Take 1 tablet (550 mg total) by mouth 2 (two) times daily. 180 tablet 3   No current facility-administered medications for this visit.    SURGICAL HISTORY:  Past Surgical History:  Procedure Laterality Date   CARDIAC CATHETERIZATION N/A 01/27/2016  Procedure: Left Heart Cath and Coronary Angiography;  Surgeon: Belva Crome, MD;  Location: [facility];  Service: Cardiovascular;  Laterality: N/A;   COLONOSCOPY WITH PROPOFOL N/A 07/07/2016   Procedure: COLONOSCOPY WITH PROPOFOL;  Surgeon: [name], MD;  Location: [facility];  Service: Endoscopy;  Laterality: N/A;   CORONARY ARTERY BYPASS GRAFT N/A 01/28/2016   Procedure: CORONARY ARTERY BYPASS GRAFTING (CABG) x 3 USING RIGHT LEG GREATER SAPHENOUS VEIN GRAFT;  Surgeon: Melrose Nakayama, MD;  Location: [facility];  Service: Open Heart Surgery;  Laterality: N/A;   CYSTOSCOPY WITH RETROGRADE PYELOGRAM, URETEROSCOPY AND STENT PLACEMENT Left 05/30/2021   Procedure: CYSTOSCOPY WITH RETROGRADE PYELOGRAM, URETEROSCOPY AND STENT PLACEMENT;  Surgeon: Ceasar Mons, MD;  Location: [facility];  Service: Urology;  Laterality: Left;   CYSTOSCOPY/URETEROSCOPY/HOLMIUM LASER/STENT PLACEMENT Left 07/26/2021   Procedure: CYSTOSCOPY/RETROGRADE/URETEROSCOPY/HOLMIUM LASER/STENT EXCHANGE;  Surgeon: Janith Lima, MD;  Location: [facility];  Service: Urology;  Laterality: Left;   ENDOVEIN HARVEST OF GREATER SAPHENOUS VEIN Right 01/28/2016   Procedure: ENDOVEIN HARVEST OF GREATER SAPHENOUS VEIN;  Surgeon: Melrose Nakayama, MD;  Location: [facility];  Service: Open Heart Surgery;  Laterality: Right;   ESOPHAGEAL BANDING  03/28/2019   Procedure: ESOPHAGEAL  BANDING;  Surgeon: [name], MD;  Location: [facility];  Service: Endoscopy;;   ESOPHAGEAL BANDING  04/07/2019   Procedure: ESOPHAGEAL BANDING;  Surgeon: Juanita Craver, MD;  Location: [facility];  Service: Endoscopy;;   ESOPHAGOGASTRODUODENOSCOPY N/A 04/07/2019   Procedure: ESOPHAGOGASTRODUODENOSCOPY (EGD);  Surgeon: Juanita Craver, MD;  Location: [facility];  Service: Endoscopy;  Laterality: N/A;   ESOPHAGOGASTRODUODENOSCOPY (EGD) WITH PROPOFOL N/A 07/07/2016   Procedure: ESOPHAGOGASTRODUODENOSCOPY (EGD) WITH PROPOFOL;  Surgeon: [name], MD;  Location: [facility];  Service: Endoscopy;  Laterality: N/A;   ESOPHAGOGASTRODUODENOSCOPY (EGD) WITH PROPOFOL N/A 03/28/2019   Procedure: ESOPHAGOGASTRODUODENOSCOPY (EGD) WITH PROPOFOL;  Surgeon: [name], MD;  Location: [facility];  Service: Endoscopy;  Laterality: N/A;   EXTRACORPOREAL SHOCK WAVE LITHOTRIPSY  2010   HEMOSTASIS CLIP PLACEMENT  04/07/2019   Procedure: HEMOSTASIS CLIP PLACEMENT;  Surgeon: Juanita Craver, MD;  Location: [facility];  Service: Endoscopy;;   IR ANGIOGRAM SELECTIVE EACH ADDITIONAL VESSEL  04/08/2019   IR EMBO ART  VEN HEMORR LYMPH EXTRAV  INC GUIDE ROADMAPPING  04/08/2019   IR PARACENTESIS  04/08/2019   IR RADIOLOGIST EVAL & MGMT  06/13/2019   IR RADIOLOGIST EVAL & MGMT  09/25/2019   IR RADIOLOGIST EVAL & MGMT  07/07/2020   IR RADIOLOGIST EVAL & MGMT  07/30/2021   IR TIPS  04/08/2019   MAXIMUM ACCESS (MAS)POSTERIOR LUMBAR INTERBODY FUSION (PLIF) 1 LEVEL Left 06/10/2015   Procedure: FOR MAXIMUM ACCESS (MAS) POSTERIOR LUMBAR INTERBODY FUSION (PLIF) LUMBAR THREE-FOUR EXTRAFORAMINAL MICRODISCECTOMY LUMBAR FIVE-SACRAL ONE LEFT;  Surgeon: Eustace Moore, MD;  Location: [facility];  Service: Neurosurgery;  Laterality: Left;   RADIOLOGY WITH ANESTHESIA N/A 04/08/2019   Procedure: RADIOLOGY WITH ANESTHESIA;  Surgeon: Radiologist, Medication, MD;  Location: [facility];  Service: Radiology;  Laterality: N/A;    SCLEROTHERAPY  04/07/2019   Procedure: SCLEROTHERAPY;  Surgeon: Juanita Craver, MD;  Location: [facility]COPY;  Service: Endoscopy;;   TEE WITHOUT CARDIOVERSION N/A 01/28/2016   Procedure: TRANSESOPHAGEAL ECHOCARDIOGRAM (TEE);  Surgeon: Melrose Nakayama, MD;  Location: [facility];  Service: Open Heart Surgery;  Laterality: N/A;   TUBAL LIGATION  1982   Dr Lynwood Dawley HERNIA REPAIR N/A 09/10/2019   Procedure: HERNIA REPAIR UMBILICAL  ADULT;  Surgeon: Donnie Mesa, MD;  Location: [facility];  Service: General;  Laterality: N/A;   VAGINAL HYSTERECTOMY  1997   Dr Rande Lawman    REVIEW OF SYSTEMS:   Review of Systems  Constitutional: Negative for appetite change, chills, fatigue, fever and unexpected weight change.  HENT:   Negative for mouth sores, nosebleeds, sore throat and trouble swallowing.   Eyes: Negative for eye problems and icterus.  Respiratory: Negative for cough, hemoptysis, shortness of breath and wheezing.   Cardiovascular: Negative for chest pain and leg swelling.  Gastrointestinal: Negative for abdominal pain, constipation, diarrhea, nausea and vomiting.  Genitourinary: Negative for bladder incontinence, difficulty urinating, dysuria, frequency and hematuria.   Musculoskeletal: Negative for back pain, gait problem, neck pain and neck stiffness.  Skin: Negative for itching and rash.  Neurological: Negative for dizziness, extremity weakness, gait problem, headaches, light-headedness and seizures.  Hematological: Negative for adenopathy. Does not bruise/bleed easily.  Psychiatric/Behavioral: Negative for confusion, depression and sleep disturbance. The patient is not nervous/anxious.     PHYSICAL EXAMINATION:  There were no vitals taken for this visit.  ECOG PERFORMANCE STATUS: {CHL ONC ECOG [id number]  Physical Exam  Constitutional: Oriented to person, place, and time and well-developed, well-nourished, and in no distress. No distress.  HENT:  Head: Normocephalic and  atraumatic.  Mouth/Throat: Oropharynx is clear and moist. No oropharyngeal exudate.  Eyes: Conjunctivae are normal. Right eye exhibits no discharge. Left eye exhibits no discharge. No scleral icterus.  Neck: Normal range of motion. Neck supple.  Cardiovascular: Normal rate, regular rhythm, normal heart sounds and intact distal pulses.   Pulmonary/Chest: Effort normal and breath sounds normal. No respiratory distress. No wheezes. No rales.  Abdominal: Soft. Bowel sounds are normal. Exhibits no distension and no mass. There is no tenderness.  Musculoskeletal: Normal range of motion. Exhibits no edema.  Lymphadenopathy:    No cervical adenopathy.  Neurological: Alert and oriented to person, place, and time. Exhibits normal muscle tone. Gait normal. Coordination normal.  Skin: Skin is warm and dry. No rash noted. Not diaphoretic. No erythema. No pallor.  Psychiatric: Mood, memory and judgment normal.  Vitals reviewed.  LABORATORY DATA: Lab Results  Component Value Date   WBC 4.2 06/15/2021   HGB 13.6 07/26/2021   HCT 40.0 07/26/2021   MCV 93.3 06/15/2021   PLT 97.0 (L) 06/15/2021      Chemistry      Component Value Date/Time   NA 145 07/26/2021 1302   NA 137 06/11/2019 0000   K 3.6 07/26/2021 1302   CL 107 07/26/2021 1302   CO2 29 06/15/2021 0904   BUN 15 07/26/2021 1302   BUN 24 (A) 06/11/2019 0000   CREATININE 0.50 07/26/2021 1302   CREATININE 0.83 06/08/2021 1140   GLU 195 06/11/2019 0000      Component Value Date/Time   CALCIUM 9.2 06/15/2021 0904   CALCIUM 13.4 (HH) 05/26/2019 1647   ALKPHOS 90 06/15/2021 0904   AST 40 (H) 06/15/2021 0904   ALT 33 06/15/2021 0904   BILITOT 2.1 (H) 06/15/2021 0904   BILITOT 0.3 07/08/2016 1045       RADIOGRAPHIC STUDIES:  No results found.   ASSESSMENT & PLAN:  Ynez Eugenio is a 76 y.o. Caucasian female with a history of CAD, type 2 DM, arthritis, CABG   Leukopenia/lymphopenia, likely secondary to liver  cirrhosis -She is monitored by her PCP, Dr. Sabra Heck, and was found to have decreasing white count over last year -She has no history  of liver cirrhosis, splenomegaly.  Her lymphopenia is likely related to her liver disease. -She has no frequent infection, no indication for treatment -Overall mild, and stable, will continue monitoring. -Dr. Burr Medico does not think she needs a bone marrow biopsy, primary bone marrow disease such as MDS, lymphoma, leukemia is unlikely.   2.  Mild thrombocytopenia, secondary to liver cirrhosis -She has mild thrombocytopenia in the past 6 years or longer, platelet around 100, overall stable and mild, no clinical bleeding or easy bruising. -This is likely related to her liver cirrhosis.  Dr. Burr Medico previously discussed the other etiology, such as folate or B12 deficiency, chronic hepatitis or HIV infection, alcohol related, which were all checked in the past and were WNL or negative  -ITP is also a possibility, not able to rule out.  However giving her mild thrombocytopenia, she would not require any treatment. -Primary bone marrow disease, such as MDS, lymphoma or leukemia much less likely, we still do*** not recommend bone marrow biopsy at this point. -If her platelet counts drops below 50, and she needs invasive surgery, Dr. Burr Medico would offer Doptelet to improve her thrombocytopenia before surgery.   2.  Liver cirrhosis and splenomegaly -Follow-up with Dr. [name] -She is on liver cancer screening with ultrasound and AFP every 6 months, most recent MRI liver 06/15/21   PLAN:  -Her cytopenias are likely secondary to liver cirrhosis  -we will schedule a f/u in one year   No orders of the defined types were placed in this encounter.    I spent {CHL ONC TIME VISIT - [id number]} counseling the patient face to face. The total time spent in the appointment was {CHL ONC TIME VISIT - [id number]}.  Aneliese Beaudry L Teonna Coonan, PA-C 11/03/21

## 2021-11-04 ENCOUNTER — Telehealth: Payer: Self-pay | Admitting: *Deleted

## 2021-11-04 ENCOUNTER — Other Ambulatory Visit: Payer: Self-pay | Admitting: Physician Assistant

## 2021-11-04 DIAGNOSIS — D696 Thrombocytopenia, unspecified: Secondary | ICD-10-CM

## 2021-11-04 NOTE — Telephone Encounter (Signed)
Spoke with patient and informed her that 2 boxes of patient assistance pen needles were received.   Patient aware she can pick up Monday-Friday,  8 am- 5 pm

## 2021-11-05 ENCOUNTER — Inpatient Hospital Stay: Payer: Medicare Other

## 2021-11-05 ENCOUNTER — Other Ambulatory Visit: Payer: Medicare Other

## 2021-11-05 ENCOUNTER — Inpatient Hospital Stay: Payer: Medicare Other | Admitting: Physician Assistant

## 2021-11-05 ENCOUNTER — Ambulatory Visit: Payer: Medicare Other | Admitting: Hematology

## 2021-11-05 NOTE — Progress Notes (Unsigned)
[name] OFFICE PROGRESS NOTE  Kayla Honour, MD [place] [address]  DIAGNOSIS: Neutropenia and thrombocytopenia  CURRENT THERAPY: Surveillance/observation.  INTERVAL HISTORY: [name] 76 y.o. female returns to the clinic today for a 1 year follow-up visit.  She was last seen in the clinic by Dr. Burr Medico on 11/05/2020.  The patient was referred to the clinic last year for neutropenia and thrombocytopenia.  Of note, the patient is followed closely by Dr. [name] from gastroenterology for her liver cirrhosis.  She is up-to-date on her liver cancer screenings which was last performed on 06/15/21.  The patient denies any abnormal bleeding or bruising.  She has been having stable thrombocytopenia with a platelet count around 100,000 for the last 7 years or so.  At the patient's last appointment with Dr. Burr Medico she discussed that her thrombocytopenia and leukopenia is likely secondary to her liver disease.  Dr. Burr Medico did not feel that an invasive work-up including a bone marrow biopsy and aspirate was necessary at this time as she feels that MDS and lymphoma and leukemia is unlikely.  The patient was previously tested for other etiologies such as folate, B12 deficiency, hepatitis, HIV, or alcohol as other sources of her lab abnormalities which were all normal.  In the event that the patient has any invasive surgeries planned, Dr. Burr Medico would offer Doptelet . The patient does not have any surgeries planned.   Additionally, the patient was found to have mild leukopenia for the last few years.  Patient is asymptomatic and does not have any frequent infections, except she was recently treated for UTI. Otherwise, denies any other recent or frequent infections.   She reports her energy is "pretty good". She sometimes gets fatigued later in the day.  Denies any fever, chills, or night sweats. She lost a few pounds recently but reports stable appetite.  She is here today for  1 year follow-up visit.    MEDICAL HISTORY: Past Medical History:  Diagnosis Date   Chronic cystitis    Chronic diastolic CHF (congestive heart failure) ([place])    followed by dr Johnsie Cancel   Chronic kidney disease, stage I    Cirrhosis of liver without ascites ([place]) 03/2016   followed by dr [name] (GI);   due to fatty liver;   post op cabg 09/ 2017  , post paracentesis for ascites   Coronary artery disease 01/2016   cardiologist--- dr Frances Nickels;   01-27-2016  NSTEMI  s/p cardiac cath;   s/p CABG 01/29/16: LIMA-LAD, SVG-RI, SVG-dRCA   DDD (degenerative disc disease), cervical    per pt w/ chronic neck pain   Edema of right lower extremity    Esophageal varices ([place])    followed by dr [name];   hx multiple banding of large varices   Essential hypertension    no meds   GERD (gastroesophageal reflux disease)    Heart murmur     DX FOR YEARS ASYMPTOMATIC   Hepatic encephalopathy ([place])    History of adenomatous polyp of colon    History of COVID-19 04/2020   per pt mild  symptoms that resolve   History of kidney stones    History of non-ST elevation myocardial infarction (NSTEMI) 01/27/2016   Hyperlipidemia    diet controlled   NAFLD (nonalcoholic fatty liver disease)    Neuropathy, peripheral 04/01/2015   OA (osteoarthritis)    Paroxysmal A-fib ([place])    08/19/19 afib with RVR, spontaneously converted to SR  Portal vein thrombosis    per dr [name]  ,  chronic non-occluded   Thrombocytopenia ([place])    followed by pcp  and hematology-- dr Burr Medico;   likely secondary to liver cirrhosis   Type 2 diabetes mellitus treated with insulin ([place])    followed by pcp    (07-21-2021  per pt checks blood sugar dialy in am fasting average 113--200s)   Urge incontinence of urine    Wears hearing aid in both ears     ALLERGIES:  is allergic to kiwi extract, tdap [tetanus-diphth-acell pertussis], statins, latex, and tramadol.  MEDICATIONS:  Current Outpatient Medications  Medication Sig Dispense Refill    acetaminophen (TYLENOL) 500 MG tablet Take 1,000 mg by mouth every 6 (six) hours.     amoxicillin-clavulanate (AUGMENTIN) 875-125 MG tablet Take 1 tablet by mouth 2 (two) times daily. 14 tablet 0   Biotin 10000 MCG TABS Take 10,000 mcg by mouth daily.     Calcium Carb-Cholecalciferol (CALCIUM 600 + D PO) Take 1 tablet by mouth in the morning and at bedtime.     Cholecalciferol (VITAMIN D-3 PO) Take 1,000 Units by mouth daily.     Cyanocobalamin (VITAMIN B 12 PO) Take 1,000 mcg by mouth daily.     empagliflozin (JARDIANCE) 25 MG TABS tablet Take 1 tablet (25 mg total) by mouth daily. 90 tablet 1   ezetimibe (ZETIA) 10 MG tablet Take 1 tablet (10 mg total) by mouth daily. 90 tablet 1   fluticasone (FLONASE) 50 MCG/ACT nasal spray Place 1 spray into both nostrils as needed for allergies or rhinitis.     furosemide (LASIX) 20 MG tablet TAKE 1 TABLET(20 MG) BY MOUTH DAILY 90 tablet 2   glucose blood test strip One Touch Ultra strips. Use to test blood sugar three times daily. Dx: E11.65 300 each 3   insulin detemir (LEVEMIR FLEXTOUCH) 100 UNIT/ML FlexPen Inject 25 Units into the skin at bedtime. 15 mL 3   Insulin Pen Needle (B-D ULTRAFINE III SHORT PEN) 31G X 8 MM MISC Use to give Insulin injections daily. Dx: E11.65 100 each 3   Insulin Syringe-Needle U-100 (INSULIN SYRINGE 1CC/31GX5/16") 31G X 5/16" 1 ML MISC USE AS DIRECTED TO INJECT LEVIMIR 100 each 2   lactulose (CHRONULAC) 10 GM/15ML solution TAKE 30 MLS BY MOUTH EVERY DAY 236 mL 4   LORazepam (ATIVAN) 0.5 MG tablet TAKE 1 TABLET(0.5 MG) BY MOUTH TWICE DAILY AS NEEDED FOR ANXIETY 30 tablet 1   LORazepam (ATIVAN) 0.5 MG tablet Take 1 tablet (0.5 mg total) by mouth 2 (two) times daily as needed for anxiety. 60 tablet 1   MAGNESIUM PO Take 500 mg by mouth daily.      metoprolol tartrate (LOPRESSOR) 25 MG tablet Take 1 tablet (25 mg total) by mouth as needed (use as needed for HR greater than 110). 60 tablet 2   Multiple Vitamins-Minerals  (MULTIVITAMIN WITH MINERALS) tablet Take 1 tablet by mouth daily.     pantoprazole (PROTONIX) 40 MG tablet TAKE 1 TABLET(40 MG) BY MOUTH DAILY 90 tablet 3   Polyethyl Glycol-Propyl Glycol (SYSTANE OP) Place 1 drop into both eyes at bedtime.     Probiotic Product (PROBIOTIC DAILY PO) Take 1 capsule by mouth daily. Digestive Advantage Probiotic     XIFAXAN 550 MG TABS tablet Take 1 tablet (550 mg total) by mouth 2 (two) times daily. 180 tablet 3   No current facility-administered medications for this visit.    SURGICAL HISTORY:  Past Surgical  History:  Procedure Laterality Date   CARDIAC CATHETERIZATION N/A 01/27/2016   Procedure: Left Heart Cath and Coronary Angiography;  Surgeon: Belva Crome, MD;  Location: [facility];  Service: Cardiovascular;  Laterality: N/A;   COLONOSCOPY WITH PROPOFOL N/A 07/07/2016   Procedure: COLONOSCOPY WITH PROPOFOL;  Surgeon: [name], MD;  Location: [facility];  Service: Endoscopy;  Laterality: N/A;   CORONARY ARTERY BYPASS GRAFT N/A 01/28/2016   Procedure: CORONARY ARTERY BYPASS GRAFTING (CABG) x 3 USING RIGHT LEG GREATER SAPHENOUS VEIN GRAFT;  Surgeon: Melrose Nakayama, MD;  Location: [facility];  Service: Open Heart Surgery;  Laterality: N/A;   CYSTOSCOPY WITH RETROGRADE PYELOGRAM, URETEROSCOPY AND STENT PLACEMENT Left 05/30/2021   Procedure: CYSTOSCOPY WITH RETROGRADE PYELOGRAM, URETEROSCOPY AND STENT PLACEMENT;  Surgeon: Ceasar Mons, MD;  Location: [facility];  Service: Urology;  Laterality: Left;   CYSTOSCOPY/URETEROSCOPY/HOLMIUM LASER/STENT PLACEMENT Left 07/26/2021   Procedure: CYSTOSCOPY/RETROGRADE/URETEROSCOPY/HOLMIUM LASER/STENT EXCHANGE;  Surgeon: Janith Lima, MD;  [facility];  Service: Urology;  Laterality: Left;   ENDOVEIN HARVEST OF GREATER SAPHENOUS VEIN Right 01/28/2016   Procedure: ENDOVEIN HARVEST OF GREATER SAPHENOUS VEIN;  Surgeon: Melrose Nakayama, MD;  Location: [facility];  Service: Open  Heart Surgery;  Laterality: Right;   ESOPHAGEAL BANDING  03/28/2019   Procedure: ESOPHAGEAL BANDING;  Surgeon: [name], MD;  Location: [facility];  Service: Endoscopy;;   ESOPHAGEAL BANDING  04/07/2019   Procedure: ESOPHAGEAL BANDING;  Surgeon: Juanita Craver, MD;  Location: [facility] ENDOSCOPY;  Service: Endoscopy;;   ESOPHAGOGASTRODUODENOSCOPY N/A 04/07/2019   Procedure: ESOPHAGOGASTRODUODENOSCOPY (EGD);  Surgeon: Juanita Craver, MD;  Location: [facility] ENDOSCOPY;  Service: Endoscopy;  Laterality: N/A;   ESOPHAGOGASTRODUODENOSCOPY (EGD) WITH PROPOFOL N/A 07/07/2016   Procedure: ESOPHAGOGASTRODUODENOSCOPY (EGD) WITH PROPOFOL;  Surgeon: [name], MD;  [facility];  Service: Endoscopy;  Laterality: N/A;   ESOPHAGOGASTRODUODENOSCOPY (EGD) WITH PROPOFOL N/A 03/28/2019   Procedure: ESOPHAGOGASTRODUODENOSCOPY (EGD) WITH PROPOFOL;  Surgeon: [name], MD;  Location: [facility];  Service: Endoscopy;  Laterality: N/A;   EXTRACORPOREAL SHOCK WAVE LITHOTRIPSY  2010   HEMOSTASIS CLIP PLACEMENT  04/07/2019   Procedure: HEMOSTASIS CLIP PLACEMENT;  Surgeon: Juanita Craver, MD;  Location: [facility];  Service: Endoscopy;;   IR ANGIOGRAM SELECTIVE EACH ADDITIONAL VESSEL  04/08/2019   IR EMBO ART  VEN HEMORR LYMPH EXTRAV  INC GUIDE ROADMAPPING  04/08/2019   IR PARACENTESIS  04/08/2019   IR RADIOLOGIST EVAL & MGMT  06/13/2019   IR RADIOLOGIST EVAL & MGMT  09/25/2019   IR RADIOLOGIST EVAL & MGMT  07/07/2020   IR RADIOLOGIST EVAL & MGMT  07/30/2021   IR TIPS  04/08/2019   MAXIMUM ACCESS (MAS)POSTERIOR LUMBAR INTERBODY FUSION (PLIF) 1 LEVEL Left 06/10/2015   Procedure: FOR MAXIMUM ACCESS (MAS) POSTERIOR LUMBAR INTERBODY FUSION (PLIF) LUMBAR THREE-FOUR EXTRAFORAMINAL MICRODISCECTOMY LUMBAR FIVE-SACRAL ONE LEFT;  Surgeon: Eustace Moore, MD;  Location: [facility];  Service: Neurosurgery;  Laterality: Left;   RADIOLOGY WITH ANESTHESIA N/A 04/08/2019   Procedure: RADIOLOGY WITH ANESTHESIA;   Surgeon: Radiologist, Medication, MD;  Location: [facility];  Service: Radiology;  Laterality: N/A;   SCLEROTHERAPY  04/07/2019   Procedure: SCLEROTHERAPY;  Surgeon: Juanita Craver, MD;  Location: [facility] ENDOSCOPY;  Service: Endoscopy;;   TEE WITHOUT CARDIOVERSION N/A 01/28/2016   Procedure: TRANSESOPHAGEAL ECHOCARDIOGRAM (TEE);  Surgeon: Melrose Nakayama, MD;  Location: [facility];  Service: Open Heart Surgery;  Laterality: N/A;   TUBAL LIGATION  1982   Dr Connye Burkitt  UMBILICAL HERNIA REPAIR N/A 09/10/2019   Procedure: HERNIA REPAIR UMBILICAL ADULT;  Surgeon: Donnie Mesa, MD;  Location: [facility];  Service: General;  Laterality: N/A;   VAGINAL HYSTERECTOMY  1997   Dr Rande Lawman    REVIEW OF SYSTEMS:   Review of Systems  Constitutional: Positive for fatigue. Negative for appetite change, chills,  fever and unexpected weight change.  HENT:   Negative for mouth sores, nosebleeds, sore throat and trouble swallowing.   Eyes: Negative for eye problems and icterus.  Respiratory: Negative for cough, hemoptysis, shortness of breath and wheezing.   Cardiovascular: Negative for chest pain and leg swelling.  Gastrointestinal: Negative for abdominal pain, constipation, diarrhea, nausea and vomiting.  Genitourinary: Negative for bladder incontinence, difficulty urinating, dysuria, frequency and hematuria.   Musculoskeletal: Negative for back pain, gait problem, neck pain and neck stiffness.  Skin: Negative for itching and rash.  Neurological: Negative for dizziness, extremity weakness, gait problem, headaches, light-headedness and seizures.  Hematological: Negative for adenopathy. Does not bruise/bleed easily.  Psychiatric/Behavioral: Negative for confusion, depression and sleep disturbance. The patient is not nervous/anxious.     PHYSICAL EXAMINATION:  Blood pressure (!) 135/56, pulse 73, temperature 98.1 F (36.7 C), temperature source Oral, resp. rate 16, height 5' 3"  (1.6 m), weight 122 lb 6.4 oz (55.5 kg), SpO2 99  %.  ECOG PERFORMANCE STATUS: 1  Physical Exam  Constitutional: Oriented to person, place, and time and well-developed, well-nourished, and in no distress.  HENT:  Head: Normocephalic and atraumatic.  Mouth/Throat: Oropharynx is clear and moist. No oropharyngeal exudate.  Eyes: Conjunctivae are normal. Right eye exhibits no discharge. Left eye exhibits no discharge. No scleral icterus.  Neck: Normal range of motion. Neck supple.  Cardiovascular: Normal rate, regular rhythm, systolic murmur noted and intact distal pulses.   Pulmonary/Chest: Effort normal and breath sounds normal. No respiratory distress. No wheezes. No rales.  Abdominal: Soft. Bowel sounds are normal. Exhibits no distension and no mass. There is no tenderness.  Musculoskeletal: Normal range of motion. Exhibits no edema.  Lymphadenopathy:    No cervical adenopathy.  Neurological: Alert and oriented to person, place, and time. Exhibits normal muscle tone. Gait normal. Coordination normal.  Skin: Skin is warm and dry. No rash noted. Not diaphoretic. No erythema. No pallor.  Psychiatric: Mood, memory and judgment normal.  Vitals reviewed.  LABORATORY DATA: Lab Results  Component Value Date   WBC 4.2 06/15/2021   HGB 13.6 07/26/2021   HCT 40.0 07/26/2021   MCV 93.3 06/15/2021   PLT 97.0 (L) 06/15/2021      Chemistry      Component Value Date/Time   NA 142 11/08/2021 1005   NA 137 06/11/2019 0000   K 3.3 (L) 11/08/2021 1005   CL 107 11/08/2021 1005   CO2 26 11/08/2021 1005   BUN 20 11/08/2021 1005   BUN 24 (A) 06/11/2019 0000   CREATININE 0.80 11/08/2021 1005   CREATININE 0.83 06/08/2021 1140   GLU 195 06/11/2019 0000      Component Value Date/Time   CALCIUM 9.4 11/08/2021 1005   CALCIUM 13.4 (HH) 05/26/2019 1647   ALKPHOS 93 11/08/2021 1005   AST 43 (H) 11/08/2021 1005   ALT 35 11/08/2021 1005   BILITOT 1.5 (H) 11/08/2021 1005       RADIOGRAPHIC STUDIES:  No results found.   ASSESSMENT/PLAN:   Autumm Hattery is a 76 y.o. Caucasian female with a history of CAD, type 2 DM, arthritis, CABG   Leukopenia/lymphopenia, likely secondary  to liver cirrhosis -She is monitored by her PCP, Dr. Sabra Heck, and was found to have decreasing white count over last year -She has no history of liver cirrhosis, splenomegaly.  Her lymphopenia is likely related to her liver disease. -She has no frequent infection, no indication for treatment -Overall mild, and stable, will continue monitoring. -Dr. Burr Medico does not think she needs a bone marrow biopsy, primary bone marrow disease such as MDS, lymphoma, leukemia is unlikely. -Labs from today demonstrate stable thrombocytopenia with a platelet count of 96k. No neutropenia/leukopenia -Labs and follow up in 1 year.    2.  Mild thrombocytopenia, secondary to liver cirrhosis -She has mild thrombocytopenia in the past 6 years or longer, platelet around 100, overall stable and mild, no clinical bleeding or easy bruising. -This is likely related to her liver cirrhosis.  Dr. Burr Medico previously discussed the other etiology, such as folate or B12 deficiency, chronic hepatitis or HIV infection, alcohol related, which were all checked in the past and were WNL or negative  -ITP is also a possibility, not able to rule out.  However giving her mild thrombocytopenia, she would not require any treatment. -Primary bone marrow disease, such as MDS, lymphoma or leukemia much less likely, Dr. Burr Medico did not recommend bone marrow biopsy at this point. -If her platelet counts drops below 50, and she needs invasive surgery, Dr. Burr Medico would offer Doptelet to improve her thrombocytopenia before surgery. -No upcoming surgeries planned   2.  Liver cirrhosis and splenomegaly -Follow-up with Dr. [name] -She is on liver cancer screening with ultrasound and AFP every 6 months -She had an MRI of the liver in February 2023   PLAN:  -Her cytopenias are likely secondary to liver cirrhosis   -we will schedule a f/u in one year      No orders of the defined types were placed in this encounter.     The total time spent in the appointment was 20-29 minutes.   Sheilyn Boehlke L Etienne Mowers, PA-C 11/08/21

## 2021-11-08 ENCOUNTER — Inpatient Hospital Stay (HOSPITAL_BASED_OUTPATIENT_CLINIC_OR_DEPARTMENT_OTHER): Payer: Medicare Other | Admitting: Physician Assistant

## 2021-11-08 ENCOUNTER — Inpatient Hospital Stay: Payer: Medicare Other | Attending: Physician Assistant

## 2021-11-08 ENCOUNTER — Other Ambulatory Visit: Payer: Self-pay

## 2021-11-08 VITALS — BP 135/56 | HR 73 | Temp 98.1°F | Resp 16 | Ht 63.0 in | Wt 122.4 lb

## 2021-11-08 DIAGNOSIS — Z86010 Personal history of colonic polyps: Secondary | ICD-10-CM | POA: Insufficient documentation

## 2021-11-08 DIAGNOSIS — Z87440 Personal history of urinary (tract) infections: Secondary | ICD-10-CM | POA: Insufficient documentation

## 2021-11-08 DIAGNOSIS — Z885 Allergy status to narcotic agent status: Secondary | ICD-10-CM | POA: Insufficient documentation

## 2021-11-08 DIAGNOSIS — I2510 Atherosclerotic heart disease of native coronary artery without angina pectoris: Secondary | ICD-10-CM | POA: Insufficient documentation

## 2021-11-08 DIAGNOSIS — Z8616 Personal history of COVID-19: Secondary | ICD-10-CM | POA: Diagnosis not present

## 2021-11-08 DIAGNOSIS — D72810 Lymphocytopenia: Secondary | ICD-10-CM | POA: Insufficient documentation

## 2021-11-08 DIAGNOSIS — E785 Hyperlipidemia, unspecified: Secondary | ICD-10-CM | POA: Insufficient documentation

## 2021-11-08 DIAGNOSIS — D696 Thrombocytopenia, unspecified: Secondary | ICD-10-CM | POA: Diagnosis not present

## 2021-11-08 DIAGNOSIS — E119 Type 2 diabetes mellitus without complications: Secondary | ICD-10-CM | POA: Insufficient documentation

## 2021-11-08 DIAGNOSIS — Z888 Allergy status to other drugs, medicaments and biological substances status: Secondary | ICD-10-CM | POA: Diagnosis not present

## 2021-11-08 DIAGNOSIS — M1990 Unspecified osteoarthritis, unspecified site: Secondary | ICD-10-CM | POA: Insufficient documentation

## 2021-11-08 DIAGNOSIS — D709 Neutropenia, unspecified: Secondary | ICD-10-CM | POA: Diagnosis not present

## 2021-11-08 DIAGNOSIS — Z79899 Other long term (current) drug therapy: Secondary | ICD-10-CM | POA: Diagnosis not present

## 2021-11-08 DIAGNOSIS — I252 Old myocardial infarction: Secondary | ICD-10-CM | POA: Insufficient documentation

## 2021-11-08 DIAGNOSIS — G8929 Other chronic pain: Secondary | ICD-10-CM | POA: Insufficient documentation

## 2021-11-08 DIAGNOSIS — Z87442 Personal history of urinary calculi: Secondary | ICD-10-CM | POA: Insufficient documentation

## 2021-11-08 DIAGNOSIS — I10 Essential (primary) hypertension: Secondary | ICD-10-CM | POA: Diagnosis not present

## 2021-11-08 DIAGNOSIS — K7460 Unspecified cirrhosis of liver: Secondary | ICD-10-CM | POA: Insufficient documentation

## 2021-11-08 DIAGNOSIS — Z887 Allergy status to serum and vaccine status: Secondary | ICD-10-CM | POA: Diagnosis not present

## 2021-11-08 DIAGNOSIS — K760 Fatty (change of) liver, not elsewhere classified: Secondary | ICD-10-CM | POA: Insufficient documentation

## 2021-11-08 LAB — CBC WITH DIFFERENTIAL (CANCER CENTER ONLY)
Abs Immature Granulocytes: 0 10*3/uL (ref 0.00–0.07)
Basophils Absolute: 0 10*3/uL (ref 0.0–0.1)
Basophils Relative: 0 %
Eosinophils Absolute: 0 10*3/uL (ref 0.0–0.5)
Eosinophils Relative: 1 %
HCT: 42.2 % (ref 36.0–46.0)
Hemoglobin: 14.5 g/dL (ref 12.0–15.0)
Immature Granulocytes: 0 %
Lymphocytes Relative: 13 %
Lymphs Abs: 0.7 10*3/uL (ref 0.7–4.0)
MCH: 31 pg (ref 26.0–34.0)
MCHC: 34.4 g/dL (ref 30.0–36.0)
MCV: 90.2 fL (ref 80.0–100.0)
Monocytes Absolute: 0.5 10*3/uL (ref 0.1–1.0)
Monocytes Relative: 9 %
Neutro Abs: 4.1 10*3/uL (ref 1.7–7.7)
Neutrophils Relative %: 77 %
Platelet Count: 96 10*3/uL — ABNORMAL LOW (ref 150–400)
RBC: 4.68 MIL/uL (ref 3.87–5.11)
RDW: 13.7 % (ref 11.5–15.5)
Smear Review: NORMAL
WBC Count: 5.4 10*3/uL (ref 4.0–10.5)
nRBC: 0 % (ref 0.0–0.2)

## 2021-11-08 LAB — CMP (CANCER CENTER ONLY)
ALT: 35 U/L (ref 0–44)
AST: 43 U/L — ABNORMAL HIGH (ref 15–41)
Albumin: 3.6 g/dL (ref 3.5–5.0)
Alkaline Phosphatase: 93 U/L (ref 38–126)
Anion gap: 9 (ref 5–15)
BUN: 20 mg/dL (ref 8–23)
CO2: 26 mmol/L (ref 22–32)
Calcium: 9.4 mg/dL (ref 8.9–10.3)
Chloride: 107 mmol/L (ref 98–111)
Creatinine: 0.8 mg/dL (ref 0.44–1.00)
GFR, Estimated: >60 mL/min (ref >60)
Glucose, Bld: 165 mg/dL — ABNORMAL HIGH (ref 70–99)
Potassium: 3.3 mmol/L — ABNORMAL LOW (ref 3.5–5.1)
Sodium: 142 mmol/L (ref 135–145)
Total Bilirubin: 1.5 mg/dL — ABNORMAL HIGH (ref 0.3–1.2)
Total Protein: 7 g/dL (ref 6.5–8.1)

## 2021-11-09 ENCOUNTER — Encounter: Payer: Self-pay | Admitting: Dermatology

## 2021-11-09 NOTE — Progress Notes (Signed)
   New Patient   Subjective  [name] is a 76 y.o. female who presents for the following: Annual Exam (White spots around nose x year- no itch no bleed, right cheek  1 year- red lesion- no itch no bleed & right lower leg x months- no itch no bleed).  General skin examination, multiple spots she would like checked Location:  Duration:  Quality:  Associated Signs/Symptoms: Modifying Factors:  Severity:  Timing: Context:    The following portions of the chart were reviewed this encounter and updated as appropriate:  Tobacco  Allergies  Meds  Problems  Med Hx  Surg Hx  Fam Hx      Objective  Well appearing patient in no apparent distress; mood and affect are within normal limits. General skin examination: No atypical pigmented lesions (all checked with dermoscopy), no nonmelanoma skin cancer.  Right buttocks lichenoid keratoses, facial SGHP, right outer eye hydro cystoma,      Right Temple 3 mm noninflamed vesicular papule compatible with a hydrocystoma  Right Hip (side) - Posterior Right inner buttocks lichenified 6 mm papule compatible with lichenoid keratosis versus prurigo nodule.  Head - Anterior (Face) Half dozen 2 mm flesh-colored papules, several with eccentric dell    A full examination was performed including scalp, head, eyes, ears, nose, lips, neck, chest, axillae, abdomen, back, buttocks, bilateral upper extremities, bilateral lower extremities, hands, feet, fingers, toes, fingernails, and toenails. All findings within normal limits unless otherwise noted below.  Areas beneath undergarments not fully examined.   Assessment & Plan  Screening exam for skin cancer  All safe to leave if stable, defer any treatment per patient   Hydrocystoma Right Temple  I discussed elective excision, no procedure scheduled  Lichenoid keratosis Right Hip (side) - Posterior  Recheck if there is growth or bleeding.  Sebaceous hyperplasia Head - Anterior  (Face)  Told of similar appearance of early BCC so if any lesion grows or bleeds return for biopsy

## 2021-11-24 DIAGNOSIS — N3000 Acute cystitis without hematuria: Secondary | ICD-10-CM | POA: Diagnosis not present

## 2021-11-26 ENCOUNTER — Other Ambulatory Visit: Payer: Self-pay | Admitting: Gastroenterology

## 2021-11-29 ENCOUNTER — Telehealth: Payer: Self-pay

## 2021-11-29 NOTE — Telephone Encounter (Signed)
The pt has an appt for follow up on 8/25 and labs have been entered.  The pt has been advised.

## 2021-11-29 NOTE — Telephone Encounter (Signed)
-----   Message from Carl Best, RN sent at 06/16/2021 10:26 AM EST ----- Regarding: Labs Pt needs OV in 6 months & come in for labs prior to appointment.

## 2021-12-07 DIAGNOSIS — M5416 Radiculopathy, lumbar region: Secondary | ICD-10-CM | POA: Diagnosis not present

## 2021-12-08 ENCOUNTER — Telehealth: Payer: Self-pay | Admitting: *Deleted

## 2021-12-08 NOTE — Telephone Encounter (Signed)
   Pre-operative Risk Assessment    Patient Name: [name]  DOB: 1946-01-18 MRN: [id number]      Request for Surgical Clearance    Procedure:   L4-5 LAMINECTOMY, POSTEROLATERAL ARTHRODSIS W/ HARDARE EXTENSION   Date of Surgery:  Clearance TBD                                 Surgeon:  Eustace Moore Surgeon's Group or Practice Name:  [facility] Phone number:  [phone] Fax number:  [phone]   Type of Clearance Requested:   - Medical    Type of Anesthesia:  General    Additional requests/questions:    Astrid Divine   12/08/2021, 10:07 AM

## 2021-12-09 NOTE — Telephone Encounter (Signed)
   Name: [name]  DOB: [birth year]  MRN: [id number]  Primary Cardiologist: [name], MD  Chart reviewed as part of pre-operative protocol coverage. Because of [name]'s past medical history and time since last visit, she will require a follow-up tele-visit in order to better assess preoperative cardiovascular risk.  Pre-op covering staff: - Please schedule appointment and call patient to inform them. If patient already had an upcoming appointment within acceptable timeframe, please add "pre-op clearance" to the appointment notes so provider is aware. - Please contact requesting surgeon's office via preferred method (i.e, phone, fax) to inform them of need for appointment prior to surgery.  No medications have been identified as needing held.   Elgie Collard, PA-C  12/09/2021, 9:56 AM

## 2021-12-09 NOTE — Telephone Encounter (Signed)
1st attempt to reach pt regarding surgical clearance and the need for a tele visit, left a message for her to call back and ask for the preop team.

## 2021-12-13 NOTE — Telephone Encounter (Signed)
Pt returning a call to schedule preop tele visit.

## 2021-12-13 NOTE — Telephone Encounter (Signed)
Patient was returning call. please advise

## 2021-12-14 ENCOUNTER — Telehealth: Payer: Self-pay

## 2021-12-14 NOTE — Telephone Encounter (Signed)
Spoke with patient who is agreeable to do a tele visit on 8/22at 9:40 am. Med rec and consent have been done.

## 2021-12-14 NOTE — Telephone Encounter (Signed)
  Patient Consent for Virtual Visit        [name] has provided verbal consent on 12/14/2021 for a virtual visit (video or telephone).   CONSENT FOR VIRTUAL VISIT FOR:  [name]  By participating in this virtual visit I agree to the following:  I hereby voluntarily request, consent and authorize [facility] and its employed or contracted physicians, physician assistants, nurse practitioners or other licensed health care professionals (the Practitioner), to provide me with telemedicine health care services (the "Services") as deemed necessary by the treating Practitioner. I acknowledge and consent to receive the Services by the Practitioner via telemedicine. I understand that the telemedicine visit will involve communicating with the Practitioner through live audiovisual communication technology and the disclosure of certain medical information by electronic transmission. I acknowledge that I have been given the opportunity to request an in-person assessment or other available alternative prior to the telemedicine visit and am voluntarily participating in the telemedicine visit.  I understand that I have the right to withhold or withdraw my consent to the use of telemedicine in the course of my care at any time, without affecting my right to future care or treatment, and that the Practitioner or I may terminate the telemedicine visit at any time. I understand that I have the right to inspect all information obtained and/or recorded in the course of the telemedicine visit and may receive copies of available information for a reasonable fee.  I understand that some of the potential risks of receiving the Services via telemedicine include:  Delay or interruption in medical evaluation due to technological equipment failure or disruption; Information transmitted may not be sufficient (e.g. poor resolution of images) to allow for appropriate medical decision making by the  Practitioner; and/or  In rare instances, security protocols could fail, causing a breach of personal health information.  Furthermore, I acknowledge that it is my responsibility to provide information about my medical history, conditions and care that is complete and accurate to the best of my ability. I acknowledge that Practitioner's advice, recommendations, and/or decision may be based on factors not within their control, such as incomplete or inaccurate data provided by me or distortions of diagnostic images or specimens that may result from electronic transmissions. I understand that the practice of medicine is not an exact science and that Practitioner makes no warranties or guarantees regarding treatment outcomes. I acknowledge that a copy of this consent can be made available to me via my patient portal ([facility]), or I can request a printed copy by calling the office of [facility].    I understand that my insurance will be billed for this visit.   I have read or had this consent read to me. I understand the contents of this consent, which adequately explains the benefits and risks of the Services being provided via telemedicine.  I have been provided ample opportunity to ask questions regarding this consent and the Services and have had my questions answered to my satisfaction. I give my informed consent for the services to be provided through the use of telemedicine in my medical care

## 2021-12-28 ENCOUNTER — Ambulatory Visit (INDEPENDENT_AMBULATORY_CARE_PROVIDER_SITE_OTHER): Payer: Medicare Other | Admitting: General Practice

## 2021-12-28 DIAGNOSIS — Z01810 Encounter for preprocedural cardiovascular examination: Secondary | ICD-10-CM | POA: Diagnosis not present

## 2021-12-28 NOTE — Progress Notes (Signed)
Virtual Visit via Telephone Note   Because of [name]'s co-morbid illnesses, she is at least at moderate risk for complications without adequate follow up.  This format is felt to be most appropriate for this patient at this time.  The patient did not have access to video technology/had technical difficulties with video requiring transitioning to audio format only (telephone).  All issues noted in this document were discussed and addressed.  No physical exam could be performed with this format.  Please refer to the patient's chart for her consent to telehealth for [facility].  Evaluation Performed:  Preoperative cardiovascular risk assessment _____________   Date:  12/28/2021   Patient ID:  [name], [name] [birth year], MRN [id number] Patient Location:  Home Provider location:   Office  Primary Care Provider:  [name], MD Primary Cardiologist:  [name], MD  Chief Complaint / Patient Profile   76 y.o. y/o female with a h/o HTN, CAD, PVCs, A-fib, CHF who is pending L4-5 laminectomy, posterior lateral arthrodesis with hardware extension and presents today for telephonic preoperative cardiovascular risk assessment.  Past Medical History    Past Medical History:  Diagnosis Date   Chronic cystitis    Chronic diastolic CHF (congestive heart failure) ([place])    followed by dr Johnsie Cancel   Chronic kidney disease, stage I    Cirrhosis of liver without ascites ([place]) 03/2016   followed by dr [name] (GI);   due to fatty liver;   post op cabg 09/ 2017  , post paracentesis for ascites   Coronary artery disease 01/2016   cardiologist--- dr Frances Nickels;   01-27-2016  NSTEMI  s/p cardiac cath;   s/p CABG 01/29/16: LIMA-LAD, SVG-RI, SVG-dRCA   DDD (degenerative disc disease), cervical    per pt w/ chronic neck pain   Edema of right lower extremity    Esophageal varices ([place])    followed by dr [name];   hx multiple banding of large varices   Essential hypertension     no meds   GERD (gastroesophageal reflux disease)    Heart murmur     DX FOR YEARS ASYMPTOMATIC   Hepatic encephalopathy ([place])    History of adenomatous polyp of colon    History of COVID-19 04/2020   per pt mild  symptoms that resolve   History of kidney stones    History of non-ST elevation myocardial infarction (NSTEMI) 01/27/2016   Hyperlipidemia    diet controlled   NAFLD (nonalcoholic fatty liver disease)    Neuropathy, peripheral 04/01/2015   OA (osteoarthritis)    Paroxysmal A-fib ([place])    08/19/19 afib with RVR, spontaneously converted to SR   Portal vein thrombosis    per dr [name]  ,  chronic non-occluded   Thrombocytopenia ([place])    followed by pcp  and hematology-- dr Burr Medico;   likely secondary to liver cirrhosis   Type 2 diabetes mellitus treated with insulin ([place])    followed by pcp    (07-21-2021  per pt checks blood sugar dialy in am fasting average 113--200s)   Urge incontinence of urine    Wears hearing aid in both ears    Past Surgical History:  Procedure Laterality Date   CARDIAC CATHETERIZATION N/A 01/27/2016   Procedure: Left Heart Cath and Coronary Angiography;  Surgeon: Belva Crome, MD;  Location: [place] [facility];  Service: Cardiovascular;  Laterality: N/A;   COLONOSCOPY WITH PROPOFOL N/A 07/07/2016   Procedure: COLONOSCOPY WITH PROPOFOL;  Surgeon:  [name], MD;  Location: [facility];  Service: Endoscopy;  Laterality: N/A;   CORONARY ARTERY BYPASS GRAFT N/A 01/28/2016   Procedure: CORONARY ARTERY BYPASS GRAFTING (CABG) x 3 USING RIGHT LEG GREATER SAPHENOUS VEIN GRAFT;  Surgeon: Melrose Nakayama, MD;  Location: [place];  Service: Open Heart Surgery;  Laterality: N/A;   CYSTOSCOPY WITH RETROGRADE PYELOGRAM, URETEROSCOPY AND STENT PLACEMENT Left 05/30/2021   Procedure: CYSTOSCOPY WITH RETROGRADE PYELOGRAM, URETEROSCOPY AND STENT PLACEMENT;  Surgeon: Ceasar Mons, MD;  Location: [place];  Service: Urology;  Laterality: Left;    CYSTOSCOPY/URETEROSCOPY/HOLMIUM LASER/STENT PLACEMENT Left 07/26/2021   Procedure: CYSTOSCOPY/RETROGRADE/URETEROSCOPY/HOLMIUM LASER/STENT EXCHANGE;  Surgeon: Janith Lima, MD;  Location: [facility];  Service: Urology;  Laterality: Left;   ENDOVEIN HARVEST OF GREATER SAPHENOUS VEIN Right 01/28/2016   Procedure: ENDOVEIN HARVEST OF GREATER SAPHENOUS VEIN;  Surgeon: Melrose Nakayama, MD;  Location: [place];  Service: Open Heart Surgery;  Laterality: Right;   ESOPHAGEAL BANDING  03/28/2019   Procedure: ESOPHAGEAL BANDING;  Surgeon: [name], MD;  Location: [facility];  Service: Endoscopy;;   ESOPHAGEAL BANDING  04/07/2019   Procedure: ESOPHAGEAL BANDING;  Surgeon: Juanita Craver, MD;  Location: [facility] ENDOSCOPY;  Service: Endoscopy;;   ESOPHAGOGASTRODUODENOSCOPY N/A 04/07/2019   Procedure: ESOPHAGOGASTRODUODENOSCOPY (EGD);  Surgeon: Juanita Craver, MD;  Location: [facility] ENDOSCOPY;  Service: Endoscopy;  Laterality: N/A;   ESOPHAGOGASTRODUODENOSCOPY (EGD) WITH PROPOFOL N/A 07/07/2016   Procedure: ESOPHAGOGASTRODUODENOSCOPY (EGD) WITH PROPOFOL;  Surgeon: [name], MD;  Location: [facility];  Service: Endoscopy;  Laterality: N/A;   ESOPHAGOGASTRODUODENOSCOPY (EGD) WITH PROPOFOL N/A 03/28/2019   Procedure: ESOPHAGOGASTRODUODENOSCOPY (EGD) WITH PROPOFOL;  Surgeon: [name], MD;  Location: [facility];  Service: Endoscopy;  Laterality: N/A;   EXTRACORPOREAL SHOCK WAVE LITHOTRIPSY  2010   HEMOSTASIS CLIP PLACEMENT  04/07/2019   Procedure: HEMOSTASIS CLIP PLACEMENT;  Surgeon: Juanita Craver, MD;  Location: [place];  Service: Endoscopy;;   IR ANGIOGRAM SELECTIVE EACH ADDITIONAL VESSEL  04/08/2019   IR EMBO ART  VEN HEMORR LYMPH EXTRAV  INC GUIDE ROADMAPPING  04/08/2019   IR PARACENTESIS  04/08/2019   IR RADIOLOGIST EVAL & MGMT  06/13/2019   IR RADIOLOGIST EVAL & MGMT  09/25/2019   IR RADIOLOGIST EVAL & MGMT  07/07/2020   IR RADIOLOGIST EVAL & MGMT  07/30/2021   IR TIPS   04/08/2019   MAXIMUM ACCESS (MAS)POSTERIOR LUMBAR INTERBODY FUSION (PLIF) 1 LEVEL Left 06/10/2015   Procedure: FOR MAXIMUM ACCESS (MAS) POSTERIOR LUMBAR INTERBODY FUSION (PLIF) LUMBAR THREE-FOUR EXTRAFORAMINAL MICRODISCECTOMY LUMBAR FIVE-SACRAL ONE LEFT;  Surgeon: Eustace Moore, MD;  Location: [facility];  Service: Neurosurgery;  Laterality: Left;   RADIOLOGY WITH ANESTHESIA N/A 04/08/2019   Procedure: RADIOLOGY WITH ANESTHESIA;  Surgeon: Radiologist, Medication, MD;  Location: [place];  Service: Radiology;  Laterality: N/A;   SCLEROTHERAPY  04/07/2019   Procedure: SCLEROTHERAPY;  Surgeon: Juanita Craver, MD;  Location[facility] ENDOSCOPY;  Service: Endoscopy;;   TEE WITHOUT CARDIOVERSION N/A 01/28/2016   Procedure: TRANSESOPHAGEAL ECHOCARDIOGRAM (TEE);  Surgeon: Melrose Nakayama, MD;  Location: [place];  Service: Open Heart Surgery;  Laterality: N/A;   TUBAL LIGATION  1982   Dr Lynwood Dawley HERNIA REPAIR N/A 09/10/2019   Procedure: HERNIA REPAIR UMBILICAL ADULT;  Surgeon: Donnie Mesa, MD;  Location: [place];  Service: General;  Laterality: N/A;   VAGINAL HYSTERECTOMY  1997   Dr Rande Lawman    Allergies  Allergies  Allergen Reactions   Kiwi Extract Anaphylaxis  Tdap [Tetanus-Diphth-Acell Pertussis] Swelling and Other (See Comments)    Swelling at injection site, gets very hot   Statins Other (See Comments)    RHABDOMYOLYSIS   Latex Itching, Dermatitis and Rash   Tramadol Nausea And Vomiting    History of Present Illness    Aline Wesche is a 76 y.o. female who presents via audio/video conferencing for a telehealth visit today.  Pt was last seen in cardiology clinic on 07/16/2021 by Dr. Johnsie Cancel.  At that time Lisabeth Register was doing well .  The patient is now pending procedure as outlined above. Since her last visit, she continues to be stable from a cardiac standpoint.  Today she denies chest pain, shortness of breath, lower extremity edema, fatigue, palpitations,  melena, hematuria, hemoptysis, diaphoresis, weakness, presyncope, syncope, orthopnea, and PND.    Home Medications    Prior to Admission medications   Medication Sig Start Date End Date Taking? Authorizing Provider  acetaminophen (TYLENOL) 500 MG tablet Take 1,000 mg by mouth every 6 (six) hours.    [provider]  amoxicillin-clavulanate (AUGMENTIN) 875-125 MG tablet Take 1 tablet by mouth 2 (two) times daily. Patient not taking: Reported on 12/14/2021 10/21/21   Lauree Chandler, NP  Biotin 10000 MCG TABS Take 10,000 mcg by mouth daily.    [provider]  Calcium Carb-Cholecalciferol (CALCIUM 600 + D PO) Take 1 tablet by mouth in the morning and at bedtime.    [provider]  Cholecalciferol (VITAMIN D-3 PO) Take 1,000 Units by mouth daily.    [provider]  ciprofloxacin (CIPRO) 500 MG tablet Take 500 mg by mouth 2 (two) times daily. 11/10/21   [provider]  Cyanocobalamin (VITAMIN B 12 PO) Take 1,000 mcg by mouth daily.    [provider]  empagliflozin (JARDIANCE) 25 MG TABS tablet Take 1 tablet (25 mg total) by mouth daily. 08/12/21   [name], MD  ezetimibe (ZETIA) 10 MG tablet Take 1 tablet (10 mg total) by mouth daily. 07/27/21   [name], MD  fluticasone [facility]) 50 MCG/ACT nasal spray Place 1 spray into both nostrils as needed for allergies or rhinitis.    [provider]  furosemide (LASIX) 20 MG tablet TAKE 1 TABLET(20 MG) BY MOUTH DAILY 05/26/21   Josue Hector, MD  glucose blood test strip One Touch Ultra strips. Use to test blood sugar three times daily. Dx: E11.65 12/03/20   [name], MD  insulin detemir (LEVEMIR FLEXTOUCH) 100 UNIT/ML FlexPen Inject 25 Units into the skin at bedtime. 06/08/21   Ngetich, Dinah C, NP  Insulin Pen Needle (B-D ULTRAFINE III SHORT PEN) 31G X 8 MM MISC Use to give Insulin injections daily. Dx: E11.65 10/11/21   [name], MD  Insulin Syringe-Needle  U-100 (INSULIN SYRINGE 1CC/31GX5/16") 31G X 5/16" 1 ML MISC USE AS DIRECTED TO INJECT LEVIMIR 07/29/20   [name], MD  lactulose [facility]) 10 GM/15ML solution TAKE 30 ML BY MOUTH EVERY DAY 11/26/21   [name], MD  LORazepam (ATIVAN) 0.5 MG tablet TAKE 1 TABLET(0.5 MG) BY MOUTH TWICE DAILY AS NEEDED FOR ANXIETY 10/27/21   [name], MD  LORazepam (ATIVAN) 0.5 MG tablet Take 1 tablet (0.5 mg total) by mouth 2 (two) times daily as needed for anxiety. 10/25/21   Lauree Chandler, NP  MAGNESIUM PO Take 500 mg by mouth daily.     [provider]  metoprolol tartrate (LOPRESSOR)  25 MG tablet Take 1 tablet (25 mg total) by mouth as needed (use as needed for HR greater than 110). 08/21/19   Tommie Raymond, NP  Multiple Vitamins-Minerals (MULTIVITAMIN WITH MINERALS) tablet Take 1 tablet by mouth daily.    [provider]  pantoprazole (PROTONIX) 40 MG tablet TAKE 1 TABLET(40 MG) BY MOUTH DAILY 10/25/21   [name], MD  Polyethyl Glycol-Propyl Glycol (SYSTANE OP) Place 1 drop into both eyes at bedtime.    [provider]  Probiotic Product (PROBIOTIC DAILY PO) Take 1 capsule by mouth daily. Digestive Advantage Probiotic    [provider]  XIFAXAN 550 MG TABS tablet Take 1 tablet (550 mg total) by mouth 2 (two) times daily. 06/30/21   [name], MD    Physical Exam    Vital Signs:  Lisabeth Register does not have vital signs available for review today.  Given telephonic nature of communication, physical exam is limited. AAOx3. NAD. Normal affect.  Speech and respirations are unlabored.  Accessory Clinical Findings    None  Assessment & Plan    1.  Preoperative Cardiovascular Risk Assessment:L4-5 laminectomy, posterior lateral arthrodesis with hardware extension, Dr. Sherley Bounds, [place]      Primary Cardiologist: [name], MD  Chart reviewed as part of pre-operative protocol coverage.  Given past medical history and time since last visit, based on ACC/AHA guidelines, Britlyn Martine would be at acceptable risk for the planned procedure without further cardiovascular testing.   Patient was advised that if she develops new symptoms prior to surgery to contact our office to arrange a follow-up appointment.  She verbalized understanding.   Her RCRI is a class IV risk, 11% risk of major cardiac event.  She is able to complete greater than 4 METS of physical activity.    A copy of this note will be routed to requesting surgeon.  Time:   Today, I have spent 7 minutes with the patient with telehealth technology discussing medical history, symptoms, and management plan.     Deberah Pelton, NP  12/28/2021, 8:32 AM

## 2021-12-30 ENCOUNTER — Other Ambulatory Visit: Payer: Self-pay | Admitting: Neurological Surgery

## 2021-12-31 ENCOUNTER — Ambulatory Visit: Payer: Medicare Other | Admitting: Gastroenterology

## 2022-01-04 ENCOUNTER — Telehealth: Payer: Self-pay

## 2022-01-04 ENCOUNTER — Other Ambulatory Visit (HOSPITAL_COMMUNITY): Payer: Self-pay

## 2022-01-04 ENCOUNTER — Ambulatory Visit (INDEPENDENT_AMBULATORY_CARE_PROVIDER_SITE_OTHER): Payer: Medicare Other | Admitting: Family Medicine

## 2022-01-04 ENCOUNTER — Encounter: Payer: Self-pay | Admitting: Family Medicine

## 2022-01-04 VITALS — BP 128/56 | HR 65 | Temp 98.0°F | Resp 20 | Ht 63.0 in | Wt 125.0 lb

## 2022-01-04 DIAGNOSIS — I480 Paroxysmal atrial fibrillation: Secondary | ICD-10-CM | POA: Diagnosis not present

## 2022-01-04 DIAGNOSIS — E1159 Type 2 diabetes mellitus with other circulatory complications: Secondary | ICD-10-CM

## 2022-01-04 DIAGNOSIS — Z794 Long term (current) use of insulin: Secondary | ICD-10-CM

## 2022-01-04 DIAGNOSIS — E7849 Other hyperlipidemia: Secondary | ICD-10-CM

## 2022-01-04 DIAGNOSIS — I2510 Atherosclerotic heart disease of native coronary artery without angina pectoris: Secondary | ICD-10-CM | POA: Diagnosis not present

## 2022-01-04 DIAGNOSIS — K7682 Hepatic encephalopathy: Secondary | ICD-10-CM | POA: Diagnosis not present

## 2022-01-04 NOTE — Telephone Encounter (Signed)
Patient Advocate Encounter   Received notification that prior authorization for Xifaxan 527m is due for renewal.   [place] not submitted [place] and patient is currently in coverage gap. Will experience a high co-pay until met.    ABedelia Person CphT

## 2022-01-04 NOTE — Progress Notes (Signed)
Provider:  Alain Honey, MD  Careteam: Patient Care Team: [name], MD as PCP - General (Family Medicine) Josue Hector, MD as PCP - Cardiology (Cardiology) Berle Mull, MD as Consulting Physician (Family Medicine) Sharmon Revere as Physician Assistant (Cardiology) Calvert Cantor, MD as Consulting Physician (Ophthalmology) [name], MD as Attending Physician (Gastroenterology) Lavonna Monarch, MD as Consulting Physician (Dermatology)  PLACE OF SERVICE:  [place]  Advanced Directive information    Allergies  Allergen Reactions   Kiwi Extract Anaphylaxis   Tdap [Tetanus-Diphth-Acell Pertussis] Swelling and Other (See Comments)    Swelling at injection site, gets very hot   Statins Other (See Comments)    RHABDOMYOLYSIS   Latex Itching, Dermatitis and Rash   Tramadol Nausea And Vomiting    Chief Complaint  Patient presents with   Medical Management of Chronic Issues    Patient presents today for a 3 month follow-up.   Quality Metric Gaps    Colonoscopy, eye exam, urine microalbumin, A1C, zoster     HPI: Patient is a 76 y.o. female patient is here today to follow-up chronic medical problems including atherosclerotic heart disease, hypertension, diabetes, hyperlipidemia, and history of encephalopathy secondary to cirrhosis of the liver. Generally has been doing pretty well except for her back pain.  She is scheduled for surgery in October. She continues to take lactulose as well as Xifaxan for liver. For diabetes takes Jardiance 25 mg at 30 units of Levemir at bedtime.  Last A1c was 9.6 For her lipids she is statin intolerant and takes Zetia.  LDL is at goal at 72 when checked 1 year ago  Review of Systems:  Review of Systems  Constitutional: Negative.   HENT: Negative.    Eyes: Negative.   Respiratory: Negative.    Cardiovascular:  Negative for PND.  Genitourinary: Negative.   Musculoskeletal:  Positive for back pain.  Skin:  Negative.   All other systems reviewed and are negative.   Past Medical History:  Diagnosis Date   Chronic cystitis    Chronic diastolic CHF (congestive heart failure) ([place])    followed by dr Johnsie Cancel   Chronic kidney disease, stage I    Cirrhosis of liver without ascites ([place]) 03/2016   followed by dr [name] (GI);   due to fatty liver;   post op cabg 09/ 2017  , post paracentesis for ascites   Coronary artery disease 01/2016   cardiologist--- dr Frances Nickels;   01-27-2016  NSTEMI  s/p cardiac cath;   s/p CABG 01/29/16: LIMA-LAD, SVG-RI, SVG-dRCA   DDD (degenerative disc disease), cervical    per pt w/ chronic neck pain   Edema of right lower extremity    Esophageal varices ([place])    followed by dr [name];   hx multiple banding of large varices   Essential hypertension    no meds   GERD (gastroesophageal reflux disease)    Heart murmur     DX FOR YEARS ASYMPTOMATIC   Hepatic encephalopathy ([place])    History of adenomatous polyp of colon    History of COVID-19 04/2020   per pt mild  symptoms that resolve   History of kidney stones    History of non-ST elevation myocardial infarction (NSTEMI) 01/27/2016   Hyperlipidemia    diet controlled   NAFLD (nonalcoholic fatty liver disease)    Neuropathy, peripheral 04/01/2015   OA (osteoarthritis)    Paroxysmal A-fib ([place])    08/19/19 afib with RVR, spontaneously converted to  SR   Portal vein thrombosis    per dr [name]  ,  chronic non-occluded   Thrombocytopenia ([place])    followed by pcp  and hematology-- dr Burr Medico;   likely secondary to liver cirrhosis   Type 2 diabetes mellitus treated with insulin ([place])    followed by pcp    (07-21-2021  per pt checks blood sugar dialy in am fasting average 113--200s)   Urge incontinence of urine    Wears hearing aid in both ears    Past Surgical History:  Procedure Laterality Date   CARDIAC CATHETERIZATION N/A 01/27/2016   Procedure: Left Heart Cath and Coronary Angiography;  Surgeon: Belva Crome, MD;   Location: [facility];  Service: Cardiovascular;  Laterality: N/A;   COLONOSCOPY WITH PROPOFOL N/A 07/07/2016   Procedure: COLONOSCOPY WITH PROPOFOL;  Surgeon: [name], MD;  Location: [facility];  Service: Endoscopy;  Laterality: N/A;   CORONARY ARTERY BYPASS GRAFT N/A 01/28/2016   Procedure: CORONARY ARTERY BYPASS GRAFTING (CABG) x 3 USING RIGHT LEG GREATER SAPHENOUS VEIN GRAFT;  Surgeon: Melrose Nakayama, MD;  Location: [facility];  Service: Open Heart Surgery;  Laterality: N/A;   CYSTOSCOPY WITH RETROGRADE PYELOGRAM, URETEROSCOPY AND STENT PLACEMENT Left 05/30/2021   Procedure: CYSTOSCOPY WITH RETROGRADE PYELOGRAM, URETEROSCOPY AND STENT PLACEMENT;  Surgeon: Ceasar Mons, MD;  Location: [facility];  Service: Urology;  Laterality: Left;   CYSTOSCOPY/URETEROSCOPY/HOLMIUM LASER/STENT PLACEMENT Left 07/26/2021   Procedure: CYSTOSCOPY/RETROGRADE/URETEROSCOPY/HOLMIUM LASER/STENT EXCHANGE;  Surgeon: Janith Lima, MD;  Location: [facility];  Service: Urology;  Laterality: Left;   ENDOVEIN HARVEST OF GREATER SAPHENOUS VEIN Right 01/28/2016   Procedure: ENDOVEIN HARVEST OF GREATER SAPHENOUS VEIN;  Surgeon: Melrose Nakayama, MD;  Location: [facility];  Service: Open Heart Surgery;  Laterality: Right;   ESOPHAGEAL BANDING  03/28/2019   Procedure: ESOPHAGEAL BANDING;  Surgeon: [name], MD;  Location: [facility];  Service: Endoscopy;;   ESOPHAGEAL BANDING  04/07/2019   Procedure: ESOPHAGEAL BANDING;  Surgeon: Juanita Craver, MD;  Location: [facility];  Service: Endoscopy;;   ESOPHAGOGASTRODUODENOSCOPY N/A 04/07/2019   Procedure: ESOPHAGOGASTRODUODENOSCOPY (EGD);  Surgeon: Juanita Craver, MD;  Location: [facility] ENDOSCOPY;  Service: Endoscopy;  Laterality: N/A;   ESOPHAGOGASTRODUODENOSCOPY (EGD) WITH PROPOFOL N/A 07/07/2016   Procedure: ESOPHAGOGASTRODUODENOSCOPY (EGD) WITH PROPOFOL;  Surgeon: [name], MD;  [facility]: [facility];  Service: Endoscopy;   Laterality: N/A;   ESOPHAGOGASTRODUODENOSCOPY (EGD) WITH PROPOFOL N/A 03/28/2019   Procedure: ESOPHAGOGASTRODUODENOSCOPY (EGD) WITH PROPOFOL;  Surgeon: [name], MD;  Location: [facility];  Service: Endoscopy;  Laterality: N/A;   EXTRACORPOREAL SHOCK WAVE LITHOTRIPSY  2010   HEMOSTASIS CLIP PLACEMENT  04/07/2019   Procedure: HEMOSTASIS CLIP PLACEMENT;  Surgeon: Juanita Craver, MD;  Location: [place];  Service: Endoscopy;;   IR ANGIOGRAM SELECTIVE EACH ADDITIONAL VESSEL  04/08/2019   IR EMBO ART  VEN HEMORR LYMPH EXTRAV  INC GUIDE ROADMAPPING  04/08/2019   IR PARACENTESIS  04/08/2019   IR RADIOLOGIST EVAL & MGMT  06/13/2019   IR RADIOLOGIST EVAL & MGMT  09/25/2019   IR RADIOLOGIST EVAL & MGMT  07/07/2020   IR RADIOLOGIST EVAL & MGMT  07/30/2021   IR TIPS  04/08/2019   MAXIMUM ACCESS (MAS)POSTERIOR LUMBAR INTERBODY FUSION (PLIF) 1 LEVEL Left 06/10/2015   Procedure: FOR MAXIMUM ACCESS (MAS) POSTERIOR LUMBAR INTERBODY FUSION (PLIF) LUMBAR THREE-FOUR EXTRAFORAMINAL MICRODISCECTOMY LUMBAR FIVE-SACRAL ONE LEFT;  Surgeon: Eustace Moore, MD;  Location: [facility];  Service: Neurosurgery;  Laterality:  Left;   RADIOLOGY WITH ANESTHESIA N/A 04/08/2019   Procedure: RADIOLOGY WITH ANESTHESIA;  Surgeon: Radiologist, Medication, MD;  Location: [place];  Service: Radiology;  Laterality: N/A;   SCLEROTHERAPY  04/07/2019   Procedure: SCLEROTHERAPY;  Surgeon: Juanita Craver, MD;  Location[facility];  Service: Endoscopy;;   TEE WITHOUT CARDIOVERSION N/A 01/28/2016   Procedure: TRANSESOPHAGEAL ECHOCARDIOGRAM (TEE);  Surgeon: Melrose Nakayama, MD;  Location: [place];  Service: Open Heart Surgery;  Laterality: N/A;   TUBAL LIGATION  1982   Dr Lynwood Dawley HERNIA REPAIR N/A 09/10/2019   Procedure: HERNIA REPAIR UMBILICAL ADULT;  Surgeon: Donnie Mesa, MD;  Location: [place];  Service: General;  Laterality: N/A;   VAGINAL HYSTERECTOMY  1997   Dr Rande Lawman   Social History:   reports that she  has never smoked. She has never used smokeless tobacco. She reports that she does not drink alcohol and does not use drugs.  Family History  Problem Relation Age of Onset   Heart disease Mother    Breast cancer Mother    Lung cancer Father    Arthritis Sister    Arthritis Brother    Liver cancer Brother    Breast cancer Maternal Aunt    Breast cancer Paternal Aunt    Heart disease Maternal Grandmother    Heart disease Maternal Grandfather    Heart disease Paternal Grandmother    Heart disease Paternal Grandfather    Other Daughter        house fire   Colon cancer Neg Hx    Esophageal cancer Neg Hx    Rectal cancer Neg Hx    Stomach cancer Neg Hx     Medications: Patient's Medications  New Prescriptions   No medications on file  Previous Medications   ACETAMINOPHEN (TYLENOL) 500 MG TABLET    Take 1,000 mg by mouth every 6 (six) hours.   BIOTIN 09735 MCG TABS    Take 10,000 mcg by mouth daily.   CALCIUM CARB-CHOLECALCIFEROL (CALCIUM 600 + D PO)    Take 1 tablet by mouth in the morning and at bedtime.   CHOLECALCIFEROL (VITAMIN D-3 PO)    Take 1,000 Units by mouth daily.   CYANOCOBALAMIN (VITAMIN B 12 PO)    Take 1,000 mcg by mouth daily.   EMPAGLIFLOZIN (JARDIANCE) 25 MG TABS TABLET    Take 1 tablet (25 mg total) by mouth daily.   EZETIMIBE (ZETIA) 10 MG TABLET    Take 1 tablet (10 mg total) by mouth daily.   FLUTICASONE (FLONASE) 50 MCG/ACT NASAL SPRAY    Place 1 spray into both nostrils as needed for allergies or rhinitis.   FUROSEMIDE (LASIX) 20 MG TABLET    TAKE 1 TABLET(20 MG) BY MOUTH DAILY   GLUCOSE BLOOD TEST STRIP    One Touch Ultra strips. Use to test blood sugar three times daily. Dx: E11.65   INSULIN DETEMIR (LEVEMIR FLEXTOUCH) 100 UNIT/ML FLEXPEN    Inject 25 Units into the skin at bedtime.   INSULIN PEN NEEDLE (B-D ULTRAFINE III SHORT PEN) 31G X 8 MM MISC    Use to give Insulin injections daily. Dx: E11.65   INSULIN SYRINGE-NEEDLE U-100 (INSULIN SYRINGE  1CC/31GX5/16") 31G X 5/16" 1 ML MISC    USE AS DIRECTED TO INJECT LEVIMIR   LACTULOSE (CHRONULAC) 10 GM/15ML SOLUTION    TAKE 30 ML BY MOUTH EVERY DAY   LORAZEPAM (ATIVAN) 0.5 MG TABLET    TAKE 1 TABLET(0.5 MG) BY MOUTH TWICE  DAILY AS NEEDED FOR ANXIETY   LORAZEPAM (ATIVAN) 0.5 MG TABLET    Take 1 tablet (0.5 mg total) by mouth 2 (two) times daily as needed for anxiety.   MAGNESIUM PO    Take 500 mg by mouth daily.    METOPROLOL TARTRATE (LOPRESSOR) 25 MG TABLET    Take 1 tablet (25 mg total) by mouth as needed (use as needed for HR greater than 110).   MULTIPLE VITAMINS-MINERALS (MULTIVITAMIN WITH MINERALS) TABLET    Take 1 tablet by mouth daily.   PANTOPRAZOLE (PROTONIX) 40 MG TABLET    TAKE 1 TABLET(40 MG) BY MOUTH DAILY   POLYETHYL GLYCOL-PROPYL GLYCOL (SYSTANE OP)    Place 1 drop into both eyes at bedtime.   PROBIOTIC PRODUCT (PROBIOTIC DAILY PO)    Take 1 capsule by mouth daily. Digestive Advantage Probiotic   XIFAXAN 550 MG TABS TABLET    Take 1 tablet (550 mg total) by mouth 2 (two) times daily.  Modified Medications   No medications on file  Discontinued Medications   AMOXICILLIN-CLAVULANATE (AUGMENTIN) 875-125 MG TABLET    Take 1 tablet by mouth 2 (two) times daily.   CIPROFLOXACIN (CIPRO) 500 MG TABLET    Take 500 mg by mouth 2 (two) times daily.    Physical Exam:  Vitals:   01/04/22 1333  BP: (!) 128/56  Pulse: 65  Resp: 20  Temp: 98 F (36.7 C)  SpO2: 98%  Weight: 125 lb (56.7 kg)  Height: 5' 3"  (1.6 m)   Body mass index is 22.14 kg/m. Wt Readings from Last 3 Encounters:  01/04/22 125 lb (56.7 kg)  11/08/21 122 lb 6.4 oz (55.5 kg)  10/21/21 127 lb 9.6 oz (57.9 kg)    Physical Exam Vitals and nursing note reviewed.  Constitutional:      Appearance: Normal appearance.     Comments: Uses cane for ambulation  Cardiovascular:     Rate and Rhythm: Normal rate and regular rhythm.     Pulses: Normal pulses.  Pulmonary:     Effort: Pulmonary effort is normal.      Breath sounds: Normal breath sounds.  Neurological:     Mental Status: She is alert.     Labs reviewed: Basic Metabolic Panel: Recent Labs    06/08/21 1140 06/15/21 0904 07/26/21 1302 11/08/21 1005  NA 137 140 145 142  K 4.3 3.8 3.6 3.3*  CL 101 104 107 107  CO2 30 29  --  26  GLUCOSE 341* 98 126* 165*  BUN 19 16 15 20   CREATININE 0.83 0.71 0.50 0.80  CALCIUM 10.0 9.2  --  9.4   Liver Function Tests: Recent Labs    05/30/21 0417 06/15/21 0904 11/08/21 1005  AST 63* 40* 43*  ALT 44 33 35  ALKPHOS 94 90 93  BILITOT 2.3* 2.1* 1.5*  PROT 6.6 6.8 7.0  ALBUMIN 2.7* 3.3* 3.6   Recent Labs    03/11/21 1246 05/29/21 1842  LIPASE 26 28   No results for input(s): "AMMONIA" in the last 8760 hours. CBC: Recent Labs    06/08/21 1140 06/15/21 0904 07/26/21 1302 11/08/21 1005  WBC 4.6 4.2  --  5.4  NEUTROABS 2,907  --   --  4.1  HGB 13.9 13.1 13.6 14.5  HCT 41.5 38.9 40.0 42.2  MCV 94.7 93.3  --  90.2  PLT 109* 97.0*  --  96*   Lipid Panel: No results for input(s): "CHOL", "HDL", "LDLCALC", "TRIG", "CHOLHDL", "LDLDIRECT" in the  last 8760 hours. TSH: No results for input(s): "TSH" in the last 8760 hours. A1C: Lab Results  Component Value Date   HGBA1C 9.6 (H) 05/30/2021     Assessment/Plan  1. Type 2 diabetes mellitus with other circulatory complication, with long-term current use of insulin (HCC) Diabetes is not  well-controlled with A1c of 9.6  2. Other hyperlipidemia Statin intolerant but Zetia has controlled lipids  3. Paroxysmal atrial fibrillation (HCC) Seems to be in sinus rhythm today  4. Encephalopathy, hepatic ([place]) Patient doing well on Xifaxan and lactulose.  Followed regularly by gastroenterology with liver ultrasounds and LFTs   Alain Honey, MD [facility] [phone]

## 2022-01-05 LAB — LIPID PANEL
Cholesterol: 174 mg/dL (ref <200)
HDL: 89 mg/dL (ref >=50)
LDL Cholesterol (Calc): 68 mg/dL (calc)
Non-HDL Cholesterol (Calc): 85 mg/dL (calc) (ref <130)
Total CHOL/HDL Ratio: 2 (calc) (ref <5.0)
Triglycerides: 91 mg/dL (ref <150)

## 2022-01-05 LAB — HEMOGLOBIN A1C
Hgb A1c MFr Bld: 10.1 % of total Hgb — ABNORMAL HIGH (ref <5.7)
Mean Plasma Glucose: 243 mg/dL
eAG (mmol/L): 13.5 mmol/L

## 2022-01-26 ENCOUNTER — Encounter (HOSPITAL_COMMUNITY): Payer: Self-pay | Admitting: Emergency Medicine

## 2022-01-26 ENCOUNTER — Other Ambulatory Visit: Payer: Self-pay | Admitting: Cardiology

## 2022-01-26 ENCOUNTER — Emergency Department (HOSPITAL_COMMUNITY)
Admission: EM | Admit: 2022-01-26 | Discharge: 2022-01-26 | Disposition: A | Discharge status: Home / Self Care | Payer: Medicare Other | Attending: Emergency Medicine | Admitting: Emergency Medicine

## 2022-01-26 ENCOUNTER — Ambulatory Visit
Admission: EM | Admit: 2022-01-26 | Discharge: 2022-01-26 | Disposition: A | Discharge status: Home / Self Care | Payer: Medicare Other | Attending: Internal Medicine | Admitting: Internal Medicine

## 2022-01-26 DIAGNOSIS — R300 Dysuria: Secondary | ICD-10-CM | POA: Diagnosis present

## 2022-01-26 DIAGNOSIS — Z7984 Long term (current) use of oral hypoglycemic drugs: Secondary | ICD-10-CM | POA: Diagnosis not present

## 2022-01-26 DIAGNOSIS — Z91040 Latex allergy status: Secondary | ICD-10-CM | POA: Insufficient documentation

## 2022-01-26 DIAGNOSIS — I10 Essential (primary) hypertension: Secondary | ICD-10-CM | POA: Insufficient documentation

## 2022-01-26 DIAGNOSIS — E119 Type 2 diabetes mellitus without complications: Secondary | ICD-10-CM | POA: Insufficient documentation

## 2022-01-26 DIAGNOSIS — I4891 Unspecified atrial fibrillation: Secondary | ICD-10-CM | POA: Insufficient documentation

## 2022-01-26 DIAGNOSIS — R001 Bradycardia, unspecified: Secondary | ICD-10-CM

## 2022-01-26 DIAGNOSIS — I499 Cardiac arrhythmia, unspecified: Secondary | ICD-10-CM

## 2022-01-26 DIAGNOSIS — Z951 Presence of aortocoronary bypass graft: Secondary | ICD-10-CM | POA: Insufficient documentation

## 2022-01-26 DIAGNOSIS — Z794 Long term (current) use of insulin: Secondary | ICD-10-CM | POA: Insufficient documentation

## 2022-01-26 DIAGNOSIS — I2510 Atherosclerotic heart disease of native coronary artery without angina pectoris: Secondary | ICD-10-CM | POA: Diagnosis not present

## 2022-01-26 DIAGNOSIS — R011 Cardiac murmur, unspecified: Secondary | ICD-10-CM | POA: Insufficient documentation

## 2022-01-26 DIAGNOSIS — Z79899 Other long term (current) drug therapy: Secondary | ICD-10-CM | POA: Diagnosis not present

## 2022-01-26 DIAGNOSIS — N390 Urinary tract infection, site not specified: Secondary | ICD-10-CM | POA: Insufficient documentation

## 2022-01-26 LAB — URINALYSIS, ROUTINE W REFLEX MICROSCOPIC
Bilirubin Urine: NEGATIVE
Glucose, UA: >=500 mg/dL — AB
Ketones, ur: 5 mg/dL — AB
Nitrite: NEGATIVE
Protein, ur: 30 mg/dL — AB
RBC / HPF: >50 RBC/hpf — ABNORMAL HIGH (ref 0–5)
Specific Gravity, Urine: 1.033 — ABNORMAL HIGH (ref 1.005–1.030)
WBC, UA: >50 WBC/hpf — ABNORMAL HIGH (ref 0–5)
pH: 5 (ref 5.0–8.0)

## 2022-01-26 LAB — CBC WITH DIFFERENTIAL/PLATELET
Abs Immature Granulocytes: 0.01 10*3/uL (ref 0.00–0.07)
Basophils Absolute: 0 10*3/uL (ref 0.0–0.1)
Basophils Relative: 1 %
Eosinophils Absolute: 0.1 10*3/uL (ref 0.0–0.5)
Eosinophils Relative: 2 %
HCT: 43.8 % (ref 36.0–46.0)
Hemoglobin: 14.5 g/dL (ref 12.0–15.0)
Immature Granulocytes: 0 %
Lymphocytes Relative: 23 %
Lymphs Abs: 1.3 10*3/uL (ref 0.7–4.0)
MCH: 30.9 pg (ref 26.0–34.0)
MCHC: 33.1 g/dL (ref 30.0–36.0)
MCV: 93.2 fL (ref 80.0–100.0)
Monocytes Absolute: 0.5 10*3/uL (ref 0.1–1.0)
Monocytes Relative: 9 %
Neutro Abs: 3.8 10*3/uL (ref 1.7–7.7)
Neutrophils Relative %: 65 %
Platelets: 107 10*3/uL — ABNORMAL LOW (ref 150–400)
RBC: 4.7 MIL/uL (ref 3.87–5.11)
RDW: 13.6 % (ref 11.5–15.5)
WBC: 5.7 10*3/uL (ref 4.0–10.5)
nRBC: 0 % (ref 0.0–0.2)

## 2022-01-26 LAB — COMPREHENSIVE METABOLIC PANEL
ALT: 27 U/L (ref 0–44)
AST: 40 U/L (ref 15–41)
Albumin: 3.2 g/dL — ABNORMAL LOW (ref 3.5–5.0)
Alkaline Phosphatase: 60 U/L (ref 38–126)
Anion gap: 8 (ref 5–15)
BUN: 17 mg/dL (ref 8–23)
CO2: 26 mmol/L (ref 22–32)
Calcium: 9.3 mg/dL (ref 8.9–10.3)
Chloride: 105 mmol/L (ref 98–111)
Creatinine, Ser: 0.79 mg/dL (ref 0.44–1.00)
GFR, Estimated: >60 mL/min (ref >60)
Glucose, Bld: 107 mg/dL — ABNORMAL HIGH (ref 70–99)
Potassium: 3.6 mmol/L (ref 3.5–5.1)
Sodium: 139 mmol/L (ref 135–145)
Total Bilirubin: 2.1 mg/dL — ABNORMAL HIGH (ref 0.3–1.2)
Total Protein: 6.5 g/dL (ref 6.5–8.1)

## 2022-01-26 LAB — TROPONIN I (HIGH SENSITIVITY)
Troponin I (High Sensitivity): 17 ng/L (ref <18)
Troponin I (High Sensitivity): 17 ng/L (ref <18)

## 2022-01-26 LAB — PROTIME-INR
INR: 1.1 (ref 0.8–1.2)
Prothrombin Time: 14.4 seconds (ref 11.4–15.2)

## 2022-01-26 MED ORDER — SULFAMETHOXAZOLE-TRIMETHOPRIM 800-160 MG PO TABS: 1.0000 | ORAL_TABLET | Freq: Two times a day (BID) | ORAL | 0 refills | Status: DC

## 2022-01-26 MED ORDER — SODIUM CHLORIDE 0.9 % IV SOLN
1.0000 g | Freq: Once | INTRAVENOUS | Status: AC
  Administered 2022-01-26: 1 g via INTRAVENOUS
  Filled 2022-01-26: qty 10

## 2022-01-26 NOTE — ED Notes (Signed)
Patient is being discharged from the Urgent Care and sent to the Emergency Department via self . Per Hildred Alamin, patient is in need of higher level of care due to heart rate. Patient is aware and verbalizes understanding of plan of care.  Vitals:   01/26/22 0946  BP: (!) 146/77  Pulse: (!) 42  Resp: 16  Temp: 98 F (36.7 C)  SpO2: 96%

## 2022-01-26 NOTE — ED Notes (Signed)
ECG completed. SpO2/pulse monitor reads HR transitioning from 40's-80's. ECG showed HR in 70's. Irregular. Given to NP.

## 2022-01-26 NOTE — Discharge Instructions (Addendum)
You were seen here today for your urinary tract infection and slow heart rate. Regarding your slow heart rate your cardiology office will be reaching out to you for close follow-up.  If you have not heard from them by noon tomorrow please give their office a call at the number below.  Regarding your urinary tract infection you were given a dose of antibiotics in the emergency department have been discharged with a prescription for antibiotics in the outpatient setting.  Please take it as prescribed the entire course and follow-up closely with your primary care doctor.  Return to the ER with any chest pain, shortness of breath, episodes where you feel you are in a pass out, or you become very lightheaded, or you develop any new severe symptoms.

## 2022-01-26 NOTE — ED Provider Notes (Signed)
[facility] EMERGENCY DEPARTMENT Provider Note   CSN: [id number] Arrival date & time: 01/26/22  1027     History  Chief Complaint  Patient presents with   Dysuria    [name] is a 76 y.o. female who presents from [facility] for concern for bradycardia. Initially presented to the UC for dysuria, frequency x 7 days. NO fevers, chills, N/V/D. No Chest pain, SOB, or palpitations.   I have personally reviewed this patient's medical records. History of CAD s/p CABG, cirrhosis, HTN, HLD, Dmt2, afib - no long term anticoagulation due to portal vein thrombosis with large varices and banding requiring TIPS. Patient states she has not taken any metoprolol (has PRN prescription). HPI     Home Medications Prior to Admission medications   Medication Sig Start Date End Date Taking? Authorizing Provider  acetaminophen (TYLENOL) 500 MG tablet Take 1,000 mg by mouth every 6 (six) hours.    [provider]  Biotin 10000 MCG TABS Take 10,000 mcg by mouth daily.    [provider]  Calcium Carb-Cholecalciferol (CALCIUM 600 + D PO) Take 1 tablet by mouth in the morning and at bedtime.    [provider]  Cholecalciferol (VITAMIN D-3 PO) Take 1,000 Units by mouth daily.    [provider]  Cyanocobalamin (VITAMIN B 12 PO) Take 1,000 mcg by mouth daily.    [provider]  empagliflozin (JARDIANCE) 25 MG TABS tablet Take 1 tablet (25 mg total) by mouth daily. 08/12/21   [name], MD  ezetimibe (ZETIA) 10 MG tablet Take 1 tablet (10 mg total) by mouth daily. 07/27/21   [name], MD  fluticasone [facility]) 50 MCG/ACT nasal spray Place 1 spray into both nostrils as needed for allergies or rhinitis.    [provider]  furosemide (LASIX) 20 MG tablet TAKE 1 TABLET(20 MG) BY MOUTH DAILY 05/26/21   Josue Hector, MD  glucose blood test strip One Touch Ultra strips. Use to test blood sugar three times daily. Dx: E11.65  12/03/20   [name], MD  insulin detemir (LEVEMIR FLEXTOUCH) 100 UNIT/ML FlexPen Inject 25 Units into the skin at bedtime. 06/08/21   Ngetich, Dinah C, NP  Insulin Pen Needle (B-D ULTRAFINE III SHORT PEN) 31G X 8 MM MISC Use to give Insulin injections daily. Dx: E11.65 10/11/21   [name], MD  Insulin Syringe-Needle U-100 (INSULIN SYRINGE 1CC/31GX5/16") 31G X 5/16" 1 ML MISC USE AS DIRECTED TO INJECT LEVIMIR 07/29/20   [name], MD  lactulose [facility]) 10 GM/15ML solution TAKE 30 ML BY MOUTH EVERY DAY 11/26/21   [name], MD  LORazepam (ATIVAN) 0.5 MG tablet TAKE 1 TABLET(0.5 MG) BY MOUTH TWICE DAILY AS NEEDED FOR ANXIETY 10/27/21   [name], MD  LORazepam (ATIVAN) 0.5 MG tablet Take 1 tablet (0.5 mg total) by mouth 2 (two) times daily as needed for anxiety. 10/25/21   Lauree Chandler, NP  MAGNESIUM PO Take 500 mg by mouth daily.     [provider]  metoprolol tartrate (LOPRESSOR) 25 MG tablet Take 1 tablet (25 mg total) by mouth as needed (use as needed for HR greater than 110). 08/21/19   Tommie Raymond, NP  Multiple Vitamins-Minerals (MULTIVITAMIN WITH MINERALS) tablet Take 1 tablet by mouth daily.    [provider]  pantoprazole (PROTONIX) 40 MG tablet TAKE 1 TABLET(40 MG) BY MOUTH DAILY 10/25/21   [name], MD  Polyethyl  Glycol-Propyl Glycol (SYSTANE OP) Place 1 drop into both eyes at bedtime.    [provider]  Probiotic Product (PROBIOTIC DAILY PO) Take 1 capsule by mouth daily. Digestive Advantage Probiotic    [provider]  XIFAXAN 550 MG TABS tablet Take 1 tablet (550 mg total) by mouth 2 (two) times daily. 06/30/21   [name], MD      Allergies    Kiwi extract, Tdap [tetanus-diphth-acell pertussis], Statins, Latex, and Tramadol    Review of Systems   Review of Systems  Constitutional: Negative.   HENT: Negative.    Respiratory: Negative.    Cardiovascular: Negative.    Gastrointestinal: Negative.   Genitourinary:  Positive for dysuria and frequency. Negative for decreased urine volume, vaginal bleeding, vaginal discharge and vaginal pain.  Musculoskeletal: Negative.   Neurological: Negative.     Physical Exam Updated Vital Signs BP (!) 157/55 (BP Location: Right Arm)   Pulse (!) 55   Temp 98 F (36.7 C) (Oral)   Resp 14   LMP  (LMP Unknown)   SpO2 98%  Physical Exam Vitals and nursing note reviewed.  Constitutional:      Appearance: She is not ill-appearing or toxic-appearing.  HENT:     Head: Normocephalic and atraumatic.     Mouth/Throat:     Mouth: Mucous membranes are moist.     Pharynx: No oropharyngeal exudate or posterior oropharyngeal erythema.  Eyes:     General:        Right eye: No discharge.        Left eye: No discharge.     Conjunctiva/sclera: Conjunctivae normal.  Cardiovascular:     Rate and Rhythm: Regular rhythm. Bradycardia present.     Pulses: Normal pulses.     Heart sounds: Murmur heard.     Comments: HR in the 40s at time of my initial evaluation, improved to the 70s spontaneously while interviewing the patient.  Pulmonary:     Effort: Pulmonary effort is normal. No respiratory distress.     Breath sounds: Normal breath sounds. No wheezing or rales.  Abdominal:     General: Bowel sounds are normal. There is no distension.     Tenderness: There is abdominal tenderness in the suprapubic area. There is no guarding or rebound.  Musculoskeletal:        General: No deformity.     Cervical back: Neck supple.  Skin:    General: Skin is warm and dry.  Neurological:     Mental Status: She is alert. Mental status is at baseline.  Psychiatric:        Mood and Affect: Mood normal.     ED Results / Procedures / Treatments   Labs (all labs ordered are listed, but only abnormal results are displayed) Labs Reviewed  CBC WITH DIFFERENTIAL/PLATELET - Abnormal; Notable for the following components:      Result Value    Platelets 107 (*)    All other components within normal limits  COMPREHENSIVE METABOLIC PANEL - Abnormal; Notable for the following components:   Glucose, Bld 107 (*)    Albumin 3.2 (*)    Total Bilirubin 2.1 (*)    All other components within normal limits  URINALYSIS, ROUTINE W REFLEX MICROSCOPIC - Abnormal; Notable for the following components:   APPearance HAZY (*)    Specific Gravity, Urine 1.033 (*)    Glucose, UA >=500 (*)    Hgb urine dipstick LARGE (*)    Ketones, ur 5 (*)  Protein, ur 30 (*)    Leukocytes,Ua MODERATE (*)    RBC / HPF >50 (*)    WBC, UA >50 (*)    Bacteria, UA RARE (*)    All other components within normal limits  PROTIME-INR  TROPONIN I (HIGH SENSITIVITY)  TROPONIN I (HIGH SENSITIVITY)    EKG EKG Interpretation  Date/Time:  Wednesday January 26 2022 11:08:44 EDT Ventricular Rate:  82 PR Interval:  154 QRS Duration: 100 QT Interval:  444 QTC Calculation: 518 R Axis:   16 Text Interpretation: Sinus rhythm with Premature atrial complexes in a pattern of bigeminy Nonspecific ST and T wave abnormality Prolonged QT Abnormal ECG When compared with ECG of 26-Jan-2022 09:56, PREVIOUS ECG IS PRESENT when compared to prior, similar bigeminy pattern. No STEMI Confirmed by Antony Blackbird [phone]) on 01/26/2022 4:56:15 PM  Radiology No results found.  Procedures Procedures    Medications Ordered in ED Medications - No data to display  ED Course/ Medical Decision Making/ A&P Clinical Course as of 01/26/22 1947  Wed Jan 26, 2022  1847 Consult to Dr. Sallyanne Kuster, [facility], who states as patient is asymptomatic she may be discharged today.  He will arrange for her to have a heart monitor sent to her home and will set up for her to have close outpatient follow-up.  I appreciate his collaboration in care of this patient. [RS]    Clinical Course User Index [RS] Alayha Babineaux, Gypsy Balsam, PA-C                           Medical Decision Making 76 year old female  presents to the ED with concern for asymptomatic bradycardia noted in the outpatient setting as well as urinary symptoms.  Hypertensive on intake and bradycardic to the 30s.  Vital signs otherwise normal.  Cardiac exam initially with bradycardia though this spontaneously improved to heart rate in the 70s with regular rhythm.  Murmur.  Pulmonary exam is unremarkable, mild suprapubic tenderness palpation on abdominal exam.  Patient neurovascular tact in extremities and very well-appearing.  Differential diagnosis includes but is not limited to heart block, sick sinus syndrome metabolic derangement.    Amount and/or Complexity of Data Reviewed Labs: ordered.    Details: CBC without leukocytosis or anemia, CMP unremarkable, troponin flat at 17.  UA concerning for infection with large glucose, large hemoglobin, ketones, protein, >50 WBC and rare bacteria.  ECG/medicine tests:     Details: Initial EKG with sinus rhythm with PACs and bigeminy, repeat EKG with sinus bradycardia with heart rate in the 40s.  Risk Prescription drug management.   First dose of antibiotic with Rocephin administered in the ED.  Will discharge with prescription for antibiotics.  Regarding her bradycardia, consult to cardiology given the presentation of bradycardia.  Per cardiology as she is asymptomatic no further work-up or intervention warranted in the ED at this time.  Patient to follow-up closely in the outpatient setting with cardiology. Admission considered, however given patient is asymptomatic, not necessary at this time.   No further work-up warranted in ER this evening.  Felisia and her family  voiced understanding of her medical evaluation and treatment plan. Each of their questions answered to their expressed satisfaction.  Return precautions were given.  Patient is well-appearing, stable, and was discharged in good condition.  This chart was dictated using voice recognition software, Dragon. Despite the best  efforts of this provider to proofread and correct errors, errors may still occur which can  change documentation meaning.   Final Clinical Impression(s) / ED Diagnoses Final diagnoses:  None    Rx / DC Orders ED Discharge Orders     None         Aura Dials 01/26/22 1953    Tegeler, Gwenyth Allegra, MD 01/26/22 2144

## 2022-01-26 NOTE — ED Provider Triage Note (Signed)
Emergency Medicine Provider Triage Evaluation Note  [name] , a 76 y.o. female  was evaluated in triage.  Pt complains of bradycardia which has been ongoing for the past 2 days.  She is evaluated in urgent care this morning, reports she went to her due to ongoing UTI that she has had for the last couple days and she has been taken Tylenol for.  She denies any dizziness, lightheadedness.  She does have a history of A-fib but reports not being anticoagulated, she is prescribed metoprolol however states she has not taken this medication.  Chart review shows patient's heart rate in the 70s and 80s in the past.  Review of Systems  Positive: Urinary symptoms Negative: Dizzy, lightheaded  Physical Exam  BP (!) 132/49 (BP Location: Left Arm)   Pulse (!) 39   Temp 98.3 F (36.8 C) (Oral)   Resp 18   LMP  (LMP Unknown)   SpO2 99%  Gen:   Awake, no distress   Resp:  Normal effort  MSK:   Moves extremities without difficulty  Other:    Medical Decision Making  Medically screening exam initiated at 11:06 AM.  Appropriate orders placed.  [name] was informed that the remainder of the evaluation will be completed by another provider, this initial triage assessment does not replace that evaluation, and the importance of remaining in the ED until their evaluation is complete.     Janeece Fitting, PA-C 01/26/22 1111

## 2022-01-26 NOTE — ED Triage Notes (Signed)
Patient sent to ED from Urgent Care, patient presented with complaint of dysuria that started one week ago. Patient is alert, oriented, ambulatory, and in no apparent distress at this time. Patient was sent to ED due to bradycardia, HR 39 in triage. Patient denies generalized weakness, denies shortness of breath, denies dizziness.

## 2022-01-26 NOTE — Discharge Instructions (Signed)
Please go to the emergency department as soon as you leave urgent care for further evaluation and management of your irregular and low heart rate.

## 2022-01-26 NOTE — ED Triage Notes (Signed)
Pt c/o frequency, dysuria onset ~ last week.   HR in 40's during triage. Pt asymptomatic. Denies lightheaded, weakness, confusion. NP notified, to bedside, verbal ecg order.

## 2022-01-26 NOTE — ED Provider Notes (Signed)
[facility]    CSN: [id number] Arrival date & time: 01/26/22  0813      History   Chief Complaint Chief Complaint  Patient presents with   Urinary Frequency    HPI [name] is a 76 y.o. female.   Patient originally presented with concerns for urinary tract infection but I was called to the room by nursing staff given that heart rate was in the 40s.  States that she has had open heart surgery in 2017.  Chart reports that she has a history of atrial fibrillation but patient states that this was not "an accurate diagnosis".  Metoprolol listed  in patient's chart but patient states that she does not take this medication.  Denies dizziness, headache, blurry vision, nausea, vomiting, chest pain, shortness of breath, feelings of palpitations.  Patient denies that she has ever been told that she has a low heart rate.  Patient states that she feels normal except for urinary tract infection symptoms.   Urinary Frequency    Past Medical History:  Diagnosis Date   Chronic cystitis    Chronic diastolic CHF (congestive heart failure) ([place])    followed by dr Johnsie Cancel   Chronic kidney disease, stage I    Cirrhosis of liver without ascites ([place]) 03/2016   followed by dr [name] (GI);   due to fatty liver;   post op cabg 09/ 2017  , post paracentesis for ascites   Coronary artery disease 01/2016   cardiologist--- dr Frances Nickels;   01-27-2016  NSTEMI  s/p cardiac cath;   s/p CABG 01/29/16: LIMA-LAD, SVG-RI, SVG-dRCA   DDD (degenerative disc disease), cervical    per pt w/ chronic neck pain   Edema of right lower extremity    Esophageal varices ([place])    followed by dr [name];   hx multiple banding of large varices   Essential hypertension    no meds   GERD (gastroesophageal reflux disease)    Heart murmur     DX FOR YEARS ASYMPTOMATIC   Hepatic encephalopathy ([place])    History of adenomatous polyp of colon    History of COVID-19 04/2020   per pt mild  symptoms that  resolve   History of kidney stones    History of non-ST elevation myocardial infarction (NSTEMI) 01/27/2016   Hyperlipidemia    diet controlled   NAFLD (nonalcoholic fatty liver disease)    Neuropathy, peripheral 04/01/2015   OA (osteoarthritis)    Paroxysmal A-fib ([place])    08/19/19 afib with RVR, spontaneously converted to SR   Portal vein thrombosis    per dr [name]  ,  chronic non-occluded   Thrombocytopenia ([place])    followed by pcp  and hematology-- dr Burr Medico;   likely secondary to liver cirrhosis   Type 2 diabetes mellitus treated with insulin ([place])    followed by pcp    (07-21-2021  per pt checks blood sugar dialy in am fasting average 113--200s)   Urge incontinence of urine    Wears hearing aid in both ears     Patient Active Problem List   Diagnosis Date Noted   Pyohydronephrosis 05/29/2021   URI (upper respiratory infection) 05/12/2021   Type 1 diabetes mellitus with diabetic nephropathy ([place]) 02/17/2021   Lymphopenia 11/05/2020   CHF (congestive heart failure) (HCC)    Coronary artery disease    Type II diabetes mellitus ([place])    Type 2 diabetes mellitus without complication ([place])    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  A-fib ([place]) 08/19/2019   S/P TIPS (transjugular intrahepatic portosystemic shunt)    Hypercalcemia 05/26/2019   AKI (acute kidney injury) ([place])    Hyponatremia    Acute lower UTI 05/16/2019   Acute delirium    General weakness 05/15/2019   Encephalopathy, hepatic ([place]) 05/15/2019   Volume depletion 05/15/2019   Bleeding esophageal varices (HCC)    UGI bleed    Infarction of spleen 04/06/2019   Odynophagia 04/01/2019   Dysphagia    Hepatoma ([place]) 01/31/2019   Coronary artery disease of native artery of native heart with stable angina pectoris ([place]) 01/31/2019   Thrombocytopenia ([place]) 09/25/2017   Left forearm pain 05/29/2017   Portal vein thrombosis 04/22/2017   Skin cancer of nose 08/23/2016   History of colonic polyps    Benign neoplasm of  rectum    Esophageal varices without bleeding ([place])    Portal hypertensive gastropathy ([place])    Leg cramps 05/18/2016   Routine lab draw 05/13/2016   Iron deficiency anemia 05/09/2016   Statin-induced rhabdomyolysis 05/07/2016   Cirrhosis of liver ([place]) 04/05/2016   PVC's (premature ventricular contractions) 04/05/2016   CAD (coronary artery disease) 02/16/2016   S/P CABG x 3 02/02/2016   History of non-ST elevation myocardial infarction (NSTEMI) 01/27/2016   DM2 (diabetes mellitus, type 2) ([place]) 12/15/2015   Trochanteric bursitis of left hip 12/15/2015   S/P lumbar spinal fusion 06/10/2015   Paresthesia 04/01/2015   Osteoporosis 03/31/2015   Hearing loss 06/25/2014   Insomnia 06/25/2014   Hair thinning 06/25/2014   Muscle spasm 10/08/2013   Biceps tendon rupture, proximal 11/24/2012   Hyperlipemia    GERD (gastroesophageal reflux disease)    Essential hypertension     Past Surgical History:  Procedure Laterality Date   CARDIAC CATHETERIZATION N/A 01/27/2016   Procedure: Left Heart Cath and Coronary Angiography;  Surgeon: Belva Crome, MD;  Location: [facility];  Service: Cardiovascular;  Laterality: N/A;   COLONOSCOPY WITH PROPOFOL N/A 07/07/2016   Procedure: COLONOSCOPY WITH PROPOFOL;  Surgeon: [name], MD;  Location: [facility];  Service: Endoscopy;  Laterality: N/A;   CORONARY ARTERY BYPASS GRAFT N/A 01/28/2016   Procedure: CORONARY ARTERY BYPASS GRAFTING (CABG) x 3 USING RIGHT LEG GREATER SAPHENOUS VEIN GRAFT;  Surgeon: Melrose Nakayama, MD;  Location: [facility];  Service: Open Heart Surgery;  Laterality: N/A;   CYSTOSCOPY WITH RETROGRADE PYELOGRAM, URETEROSCOPY AND STENT PLACEMENT Left 05/30/2021   Procedure: CYSTOSCOPY WITH RETROGRADE PYELOGRAM, URETEROSCOPY AND STENT PLACEMENT;  Surgeon: Ceasar Mons, MD;  Location: [facility];  Service: Urology;  Laterality: Left;   CYSTOSCOPY/URETEROSCOPY/HOLMIUM LASER/STENT PLACEMENT Left 07/26/2021    Procedure: CYSTOSCOPY/RETROGRADE/URETEROSCOPY/HOLMIUM LASER/STENT EXCHANGE;  Surgeon: Janith Lima, MD;  Location: [facility];  Service: Urology;  Laterality: Left;   ENDOVEIN HARVEST OF GREATER SAPHENOUS VEIN Right 01/28/2016   Procedure: ENDOVEIN HARVEST OF GREATER SAPHENOUS VEIN;  Surgeon: Melrose Nakayama, MD;  Location: [facility];  Service: Open Heart Surgery;  Laterality: Right;   ESOPHAGEAL BANDING  03/28/2019   Procedure: ESOPHAGEAL BANDING;  Surgeon: [name], MD;  Location: [facility];  Service: Endoscopy;;   ESOPHAGEAL BANDING  04/07/2019   Procedure: ESOPHAGEAL BANDING;  Surgeon: Juanita Craver, MD;  Location: [facility];  Service: Endoscopy;;   ESOPHAGOGASTRODUODENOSCOPY N/A 04/07/2019   Procedure: ESOPHAGOGASTRODUODENOSCOPY (EGD);  Surgeon: Juanita Craver, MD;  Location: [facility];  Service: Endoscopy;  Laterality: N/A;   ESOPHAGOGASTRODUODENOSCOPY (EGD) WITH PROPOFOL N/A 07/07/2016   Procedure: ESOPHAGOGASTRODUODENOSCOPY (EGD) WITH  PROPOFOL;  Surgeon: [name], MD;  Location: [facility];  Service: Endoscopy;  Laterality: N/A;   ESOPHAGOGASTRODUODENOSCOPY (EGD) WITH PROPOFOL N/A 03/28/2019   Procedure: ESOPHAGOGASTRODUODENOSCOPY (EGD) WITH PROPOFOL;  Surgeon: [name], MD;  Location: [facility];  Service: Endoscopy;  Laterality: N/A;   EXTRACORPOREAL SHOCK WAVE LITHOTRIPSY  2010   HEMOSTASIS CLIP PLACEMENT  04/07/2019   Procedure: HEMOSTASIS CLIP PLACEMENT;  Surgeon: Juanita Craver, MD;  Location: [facility];  Service: Endoscopy;;   IR ANGIOGRAM SELECTIVE EACH ADDITIONAL VESSEL  04/08/2019   IR EMBO ART  VEN HEMORR LYMPH EXTRAV  INC GUIDE ROADMAPPING  04/08/2019   IR PARACENTESIS  04/08/2019   IR RADIOLOGIST EVAL & MGMT  06/13/2019   IR RADIOLOGIST EVAL & MGMT  09/25/2019   IR RADIOLOGIST EVAL & MGMT  07/07/2020   IR RADIOLOGIST EVAL & MGMT  07/30/2021   IR TIPS  04/08/2019   MAXIMUM ACCESS (MAS)POSTERIOR LUMBAR INTERBODY FUSION  (PLIF) 1 LEVEL Left 06/10/2015   Procedure: FOR MAXIMUM ACCESS (MAS) POSTERIOR LUMBAR INTERBODY FUSION (PLIF) LUMBAR THREE-FOUR EXTRAFORAMINAL MICRODISCECTOMY LUMBAR FIVE-SACRAL ONE LEFT;  Surgeon: Eustace Moore, MD;  Location: [facility];  Service: Neurosurgery;  Laterality: Left;   RADIOLOGY WITH ANESTHESIA N/A 04/08/2019   Procedure: RADIOLOGY WITH ANESTHESIA;  Surgeon: Radiologist, Medication, MD;  Location: [facility];  Service: Radiology;  Laterality: N/A;   SCLEROTHERAPY  04/07/2019   Procedure: SCLEROTHERAPY;  Surgeon: Juanita Craver, MD;  Location: [facility];  Service: Endoscopy;;   TEE WITHOUT CARDIOVERSION N/A 01/28/2016   Procedure: TRANSESOPHAGEAL ECHOCARDIOGRAM (TEE);  Surgeon: Melrose Nakayama, MD;  Location: [place];  Service: Open Heart Surgery;  Laterality: N/A;   TUBAL LIGATION  1982   Dr Lynwood Dawley HERNIA REPAIR N/A 09/10/2019   Procedure: HERNIA REPAIR UMBILICAL ADULT;  Surgeon: Donnie Mesa, MD;  Location: [facility];  Service: General;  Laterality: N/A;   VAGINAL HYSTERECTOMY  1997   Dr Rande Lawman    OB History   No obstetric history on file.      Home Medications    Prior to Admission medications   Medication Sig Start Date End Date Taking? Authorizing Provider  acetaminophen (TYLENOL) 500 MG tablet Take 1,000 mg by mouth every 6 (six) hours.    [provider]  Biotin 10000 MCG TABS Take 10,000 mcg by mouth daily.    [provider]  Calcium Carb-Cholecalciferol (CALCIUM 600 + D PO) Take 1 tablet by mouth in the morning and at bedtime.    [provider]  Cholecalciferol (VITAMIN D-3 PO) Take 1,000 Units by mouth daily.    [provider]  Cyanocobalamin (VITAMIN B 12 PO) Take 1,000 mcg by mouth daily.    [provider]  empagliflozin (JARDIANCE) 25 MG TABS tablet Take 1 tablet (25 mg total) by mouth daily. 08/12/21   [name], MD  ezetimibe (ZETIA) 10 MG tablet Take 1 tablet (10 mg total) by mouth daily.  07/27/21   [name], MD  fluticasone [facility]) 50 MCG/ACT nasal spray Place 1 spray into both nostrils as needed for allergies or rhinitis.    [provider]  furosemide (LASIX) 20 MG tablet TAKE 1 TABLET(20 MG) BY MOUTH DAILY 05/26/21   Josue Hector, MD  glucose blood test strip One Touch Ultra strips. Use to test blood sugar three times daily. Dx: E11.65 12/03/20   [name], MD  insulin detemir (LEVEMIR FLEXTOUCH) 100 UNIT/ML FlexPen Inject 25 Units into the  skin at bedtime. 06/08/21   Ngetich, Dinah C, NP  Insulin Pen Needle (B-D ULTRAFINE III SHORT PEN) 31G X 8 MM MISC Use to give Insulin injections daily. Dx: E11.65 10/11/21   [name], MD  Insulin Syringe-Needle U-100 (INSULIN SYRINGE 1CC/31GX5/16") 31G X 5/16" 1 ML MISC USE AS DIRECTED TO INJECT LEVIMIR 07/29/20   [name], MD  lactulose [facility]) 10 GM/15ML solution TAKE 30 ML BY MOUTH EVERY DAY 11/26/21   [name], MD  LORazepam (ATIVAN) 0.5 MG tablet TAKE 1 TABLET(0.5 MG) BY MOUTH TWICE DAILY AS NEEDED FOR ANXIETY 10/27/21   [name], MD  LORazepam (ATIVAN) 0.5 MG tablet Take 1 tablet (0.5 mg total) by mouth 2 (two) times daily as needed for anxiety. 10/25/21   Lauree Chandler, NP  MAGNESIUM PO Take 500 mg by mouth daily.     [provider]  metoprolol tartrate (LOPRESSOR) 25 MG tablet Take 1 tablet (25 mg total) by mouth as needed (use as needed for HR greater than 110). 08/21/19   Tommie Raymond, NP  Multiple Vitamins-Minerals (MULTIVITAMIN WITH MINERALS) tablet Take 1 tablet by mouth daily.    [provider]  pantoprazole (PROTONIX) 40 MG tablet TAKE 1 TABLET(40 MG) BY MOUTH DAILY 10/25/21   [name], MD  Polyethyl Glycol-Propyl Glycol (SYSTANE OP) Place 1 drop into both eyes at bedtime.    [provider]  Probiotic Product (PROBIOTIC DAILY PO) Take 1 capsule by mouth daily. Digestive Advantage Probiotic    [provider]   XIFAXAN 550 MG TABS tablet Take 1 tablet (550 mg total) by mouth 2 (two) times daily. 06/30/21   [name], MD    Family History Family History  Problem Relation Age of Onset   Heart disease Mother    Breast cancer Mother    Lung cancer Father    Arthritis Sister    Arthritis Brother    Liver cancer Brother    Breast cancer Maternal Aunt    Breast cancer Paternal Aunt    Heart disease Maternal Grandmother    Heart disease Maternal Grandfather    Heart disease Paternal Grandmother    Heart disease Paternal Grandfather    Other Daughter        house fire   Colon cancer Neg Hx    Esophageal cancer Neg Hx    Rectal cancer Neg Hx    Stomach cancer Neg Hx     Social History Social History   Tobacco Use   Smoking status: Never   Smokeless tobacco: Never  Vaping Use   Vaping Use: Never used  Substance Use Topics   Alcohol use: No   Drug use: Never     Allergies   Kiwi extract, Tdap [tetanus-diphth-acell pertussis], Statins, Latex, and Tramadol   Review of Systems Review of Systems Per HPI  Physical Exam Triage Vital Signs ED Triage Vitals [01/26/22 0946]  Enc Vitals Group     BP (!) 146/77     Pulse Rate (!) 42     Resp 16     Temp 98 F (36.7 C)     Temp Source Oral     SpO2 96 %     Weight      Height      Head Circumference      Peak Flow      Pain Score 3     Pain Loc      Pain Edu?  Excl. in [place]?    No data found.  Updated Vital Signs BP (!) 146/77 (BP Location: Left Arm)   Pulse (!) 42   Temp 98 F (36.7 C) (Oral)   Resp 16   LMP  (LMP Unknown)   SpO2 96%   Visual Acuity Right Eye Distance:   Left Eye Distance:   Bilateral Distance:    Right Eye Near:   Left Eye Near:    Bilateral Near:     Physical Exam Constitutional:      General: She is not in acute distress.    Appearance: Normal appearance. She is not toxic-appearing or diaphoretic.  HENT:     Head: Normocephalic and atraumatic.  Eyes:     Extraocular  Movements: Extraocular movements intact.     Conjunctiva/sclera: Conjunctivae normal.  Cardiovascular:     Rate and Rhythm: Bradycardia present. Rhythm irregular.     Heart sounds:     Gallop present. S3 sounds present.  Pulmonary:     Effort: Pulmonary effort is normal.  Neurological:     General: No focal deficit present.     Mental Status: She is alert and oriented to person, place, and time. Mental status is at baseline.     Cranial Nerves: Cranial nerves 2-12 are intact.     Sensory: Sensation is intact.     Motor: Motor function is intact.     Comments: Walks with cane at baseline  Psychiatric:        Mood and Affect: Mood normal.        Behavior: Behavior normal.        Thought Content: Thought content normal.        Judgment: Judgment normal.      UC Treatments / Results  Labs (all labs ordered are listed, but only abnormal results are displayed) Labs Reviewed - No data to display  EKG   Radiology No results found.  Procedures Procedures (including critical care time)  Medications Ordered in UC Medications - No data to display  Initial Impression / Assessment and Plan / UC Course  I have reviewed the triage vital signs and the nursing notes.  Pertinent labs & imaging results that were available during my care of the patient were reviewed by me and considered in my medical decision making (see chart for details).     Patient is originally here for urinary tract infection symptoms concerns but this exam and history intake was deferred given that patient's heart rate appears to be very irregular.  Initial triage revealed the patient's heart rate was in the 40s.  Auscultation of the heart revealed very irregular heart rate ranging from the 40s to the 80s.  EKG was completed that showed premature atrial beats.  This is changed when compared to previous EKGs.  Patient is not symptomatic but this is different from baseline so I do think this warrants further  evaluation and management at the hospital.  Advised patient that it would be best for her to go to the hospital for further evaluation and management of irregular heart rhythm and they will be able to evaluate urinary tract infection symptoms as well.  Patient was agreeable with plan.  I suggested EMS transport but patient declined.  Risks associated with not going by EMS transport were discussed with patient and patient voiced understanding.  Patient left via her husband transporting her to the hospital. Final Clinical Impressions(s) / UC Diagnoses   Final diagnoses:  Irregular heart rhythm  Discharge Instructions      Please go to the emergency department as soon as you leave urgent care for further evaluation and management of your irregular and low heart rate.     ED Prescriptions   None    PDMP not reviewed this encounter.   Teodora Medici, [place] 01/26/22 1021

## 2022-01-27 ENCOUNTER — Telehealth: Payer: Self-pay

## 2022-01-27 ENCOUNTER — Ambulatory Visit: Payer: Medicare Other | Attending: Cardiology

## 2022-01-27 ENCOUNTER — Telehealth: Payer: Self-pay | Admitting: Gastroenterology

## 2022-01-27 ENCOUNTER — Telehealth (INDEPENDENT_AMBULATORY_CARE_PROVIDER_SITE_OTHER): Payer: Medicare Other | Admitting: Nurse Practitioner

## 2022-01-27 ENCOUNTER — Encounter: Payer: Self-pay | Admitting: Nurse Practitioner

## 2022-01-27 ENCOUNTER — Telehealth: Payer: Self-pay | Admitting: *Deleted

## 2022-01-27 DIAGNOSIS — R001 Bradycardia, unspecified: Secondary | ICD-10-CM | POA: Insufficient documentation

## 2022-01-27 DIAGNOSIS — R300 Dysuria: Secondary | ICD-10-CM | POA: Diagnosis not present

## 2022-01-27 MED ORDER — CEPHALEXIN 250 MG PO CAPS: 250.0000 mg | ORAL_CAPSULE | Freq: Three times a day (TID) | ORAL | 0 refills | Status: DC

## 2022-01-27 NOTE — Telephone Encounter (Signed)
Patient advised that she should contact the physicians office who prescribed antibiotic for UTI for further recommendations.  Patient agreed to plan and verbalized understanding.  No further questions.

## 2022-01-27 NOTE — Progress Notes (Unsigned)
Enrolled for [employer] to mail a ZIO XT long term holter monitor to the patients address on file.   Dr. Johnsie Cancel to read.

## 2022-01-27 NOTE — Telephone Encounter (Signed)
Call placed to patient to inform her that patient assistance was received via mail. Patient aware she can pick-up at her convenience

## 2022-01-27 NOTE — Telephone Encounter (Signed)
Inbound call from patient states she was prescribed an antibiotic for urinary infection. Worried because it says not to take if you have liver issues. She is requesting a call back. Thank you

## 2022-01-27 NOTE — Progress Notes (Signed)
   This service is provided via telemedicine  No vital signs collected/recorded due to the encounter was a telemedicine visit.   Location of patient (ex: home, work):  home  Patient consents to a telephone visit:  Yes  Location of the provider (ex: office, home):  [facility]  Name of any referring provider:  Sherrie Mustache, NP  Names of all persons participating in the telemedicine service and their role in the encounter:  Jacqlyn Larsen, Patient, Rafael Bihari, CMA and Sherrie Mustache, NP  Time spent on call:  6:13

## 2022-01-27 NOTE — Progress Notes (Signed)
Careteam: Patient Care Team: [name], MD as PCP - General (Family Medicine) Josue Hector, MD as PCP - Cardiology (Cardiology) Berle Mull, MD as Consulting Physician (Family Medicine) Sharmon Revere as Physician Assistant (Cardiology) Calvert Cantor, MD as Consulting Physician (Ophthalmology) [name], MD as Attending Physician (Gastroenterology) Lavonna Monarch, MD (Inactive) as Consulting Physician (Dermatology)  Advanced Directive information    Allergies  Allergen Reactions   Kiwi Extract Anaphylaxis   Tdap [Tetanus-Diphth-Acell Pertussis] Swelling and Other (See Comments)    Swelling at injection site, gets very hot   Statins Other (See Comments)    RHABDOMYOLYSIS   Latex Itching, Dermatitis and Rash   Tramadol Nausea And Vomiting    Chief Complaint  Patient presents with   Acute Visit    To Discuss side effects from Bactrim. Prescribed at the hospital for UTI. Has Liver Problems and it said to discuss with Dr. Before taking. Has take one tablet today.      HPI: Patient is a 76 y.o. female due to possible interaction of bactrim with her NASH.  She looked at information when she got her medication this morning and was concerned.  Also on xifaxan She does report burning when she urinates and went ahead and took 1 tablet. No fever or abdominal pain.  No new back pain She has noticed blood in her urine.   Review of Systems:  Review of Systems  Constitutional:  Negative for chills, fever and malaise/fatigue.  Gastrointestinal:  Negative for nausea and vomiting.  Genitourinary:  Positive for dysuria, frequency, hematuria and urgency.    Past Medical History:  Diagnosis Date   Chronic cystitis    Chronic diastolic CHF (congestive heart failure) ([place])    followed by dr Johnsie Cancel   Chronic kidney disease, stage I    Cirrhosis of liver without ascites ([place]) 03/2016   followed by dr [name] (GI);   due to fatty liver;   post op cabg 09/  2017  , post paracentesis for ascites   Coronary artery disease 01/2016   cardiologist--- dr Frances Nickels;   01-27-2016  NSTEMI  s/p cardiac cath;   s/p CABG 01/29/16: LIMA-LAD, SVG-RI, SVG-dRCA   DDD (degenerative disc disease), cervical    per pt w/ chronic neck pain   Edema of right lower extremity    Esophageal varices ([place])    followed by dr [name];   hx multiple banding of large varices   Essential hypertension    no meds   GERD (gastroesophageal reflux disease)    Heart murmur     DX FOR YEARS ASYMPTOMATIC   Hepatic encephalopathy ([place])    History of adenomatous polyp of colon    History of COVID-19 04/2020   per pt mild  symptoms that resolve   History of kidney stones    History of non-ST elevation myocardial infarction (NSTEMI) 01/27/2016   Hyperlipidemia    diet controlled   NAFLD (nonalcoholic fatty liver disease)    Neuropathy, peripheral 04/01/2015   OA (osteoarthritis)    Paroxysmal A-fib ([place])    08/19/19 afib with RVR, spontaneously converted to SR   Portal vein thrombosis    per dr [name]  ,  chronic non-occluded   Thrombocytopenia ([place])    followed by pcp  and hematology-- dr Burr Medico;   likely secondary to liver cirrhosis   Type 2 diabetes mellitus treated with insulin ([place])    followed by pcp    (07-21-2021  per pt checks  blood sugar dialy in am fasting average 113--200s)   Urge incontinence of urine    Wears hearing aid in both ears    Past Surgical History:  Procedure Laterality Date   CARDIAC CATHETERIZATION N/A 01/27/2016   Procedure: Left Heart Cath and Coronary Angiography;  Surgeon: Belva Crome, MD;  Location: [facility];  Service: Cardiovascular;  Laterality: N/A;   COLONOSCOPY WITH PROPOFOL N/A 07/07/2016   Procedure: COLONOSCOPY WITH PROPOFOL;  Surgeon: [name], MD;  Location: [facility];  Service: Endoscopy;  Laterality: N/A;   CORONARY ARTERY BYPASS GRAFT N/A 01/28/2016   Procedure: CORONARY ARTERY BYPASS GRAFTING (CABG) x 3 USING  RIGHT LEG GREATER SAPHENOUS VEIN GRAFT;  Surgeon: Melrose Nakayama, MD;  Location: [place];  Service: Open Heart Surgery;  Laterality: N/A;   CYSTOSCOPY WITH RETROGRADE PYELOGRAM, URETEROSCOPY AND STENT PLACEMENT Left 05/30/2021   Procedure: CYSTOSCOPY WITH RETROGRADE PYELOGRAM, URETEROSCOPY AND STENT PLACEMENT;  Surgeon: Ceasar Mons, MD;  Location: [facility];  Service: Urology;  Laterality: Left;   CYSTOSCOPY/URETEROSCOPY/HOLMIUM LASER/STENT PLACEMENT Left 07/26/2021   Procedure: CYSTOSCOPY/RETROGRADE/URETEROSCOPY/HOLMIUM LASER/STENT EXCHANGE;  Surgeon: Janith Lima, MD;  Location: [facility];  Service: Urology;  Laterality: Left;   ENDOVEIN HARVEST OF GREATER SAPHENOUS VEIN Right 01/28/2016   Procedure: ENDOVEIN HARVEST OF GREATER SAPHENOUS VEIN;  Surgeon: Melrose Nakayama, MD;  Location: [facility];  Service: Open Heart Surgery;  Laterality: Right;   ESOPHAGEAL BANDING  03/28/2019   Procedure: ESOPHAGEAL BANDING;  Surgeon: [name], MD;  Location: [facility];  Service: Endoscopy;;   ESOPHAGEAL BANDING  04/07/2019   Procedure: ESOPHAGEAL BANDING;  Surgeon: Juanita Craver, MD;  Location: [facility];  Service: Endoscopy;;   ESOPHAGOGASTRODUODENOSCOPY N/A 04/07/2019   Procedure: ESOPHAGOGASTRODUODENOSCOPY (EGD);  Surgeon: Juanita Craver, MD;  Location: [facility];  Service: Endoscopy;  Laterality: N/A;   ESOPHAGOGASTRODUODENOSCOPY (EGD) WITH PROPOFOL N/A 07/07/2016   Procedure: ESOPHAGOGASTRODUODENOSCOPY (EGD) WITH PROPOFOL;  Surgeon: [name], MD;  Location: [facility];  Service: Endoscopy;  Laterality: N/A;   ESOPHAGOGASTRODUODENOSCOPY (EGD) WITH PROPOFOL N/A 03/28/2019   Procedure: ESOPHAGOGASTRODUODENOSCOPY (EGD) WITH PROPOFOL;  Surgeon: [name], MD;  Location: [facility];  Service: Endoscopy;  Laterality: N/A;   EXTRACORPOREAL SHOCK WAVE LITHOTRIPSY  2010   HEMOSTASIS CLIP PLACEMENT  04/07/2019   Procedure: HEMOSTASIS CLIP PLACEMENT;   Surgeon: Juanita Craver, MD;  Location: [facility];  Service: Endoscopy;;   IR ANGIOGRAM SELECTIVE EACH ADDITIONAL VESSEL  04/08/2019   IR EMBO ART  VEN HEMORR LYMPH EXTRAV  INC GUIDE ROADMAPPING  04/08/2019   IR PARACENTESIS  04/08/2019   IR RADIOLOGIST EVAL & MGMT  06/13/2019   IR RADIOLOGIST EVAL & MGMT  09/25/2019   IR RADIOLOGIST EVAL & MGMT  07/07/2020   IR RADIOLOGIST EVAL & MGMT  07/30/2021   IR TIPS  04/08/2019   MAXIMUM ACCESS (MAS)POSTERIOR LUMBAR INTERBODY FUSION (PLIF) 1 LEVEL Left 06/10/2015   Procedure: FOR MAXIMUM ACCESS (MAS) POSTERIOR LUMBAR INTERBODY FUSION (PLIF) LUMBAR THREE-FOUR EXTRAFORAMINAL MICRODISCECTOMY LUMBAR FIVE-SACRAL ONE LEFT;  Surgeon: Eustace Moore, MD;  Location: [facility];  Service: Neurosurgery;  Laterality: Left;   RADIOLOGY WITH ANESTHESIA N/A 04/08/2019   Procedure: RADIOLOGY WITH ANESTHESIA;  Surgeon: Radiologist, Medication, MD;  Location: [place] [facility];  Service: Radiology;  Laterality: N/A;   SCLEROTHERAPY  04/07/2019   Procedure: SCLEROTHERAPY;  Surgeon: Juanita Craver, MD;  Location: [facility];  Service: Endoscopy;;   TEE WITHOUT CARDIOVERSION N/A 01/28/2016   Procedure: TRANSESOPHAGEAL ECHOCARDIOGRAM (TEE);  Surgeon: Melrose Nakayama, MD;  Location: [place];  Service: Open Heart Surgery;  Laterality: N/A;   TUBAL LIGATION  1982   Dr Lynwood Dawley HERNIA REPAIR N/A 09/10/2019   Procedure: HERNIA REPAIR UMBILICAL ADULT;  Surgeon: Donnie Mesa, MD;  Location: [facility];  Service: General;  Laterality: N/A;   VAGINAL HYSTERECTOMY  1997   Dr Rande Lawman   Social History:   reports that she has never smoked. She has never used smokeless tobacco. She reports that she does not drink alcohol and does not use drugs.  Family History  Problem Relation Age of Onset   Heart disease Mother    Breast cancer Mother    Lung cancer Father    Arthritis Sister    Arthritis Brother    Liver cancer Brother    Breast cancer Maternal Aunt    Breast cancer Paternal  Aunt    Heart disease Maternal Grandmother    Heart disease Maternal Grandfather    Heart disease Paternal Grandmother    Heart disease Paternal Grandfather    Other Daughter        house fire   Colon cancer Neg Hx    Esophageal cancer Neg Hx    Rectal cancer Neg Hx    Stomach cancer Neg Hx     Medications: Patient's Medications  New Prescriptions   No medications on file  Previous Medications   ACETAMINOPHEN (TYLENOL) 500 MG TABLET    Take 1,000 mg by mouth every 6 (six) hours.   BIOTIN 25053 MCG TABS    Take 10,000 mcg by mouth daily.   CALCIUM CARB-CHOLECALCIFEROL (CALCIUM 600 + D PO)    Take 1 tablet by mouth in the morning and at bedtime.   CHOLECALCIFEROL (VITAMIN D-3 PO)    Take 1,000 Units by mouth daily.   CYANOCOBALAMIN (VITAMIN B 12 PO)    Take 1,000 mcg by mouth daily.   EMPAGLIFLOZIN (JARDIANCE) 25 MG TABS TABLET    Take 1 tablet (25 mg total) by mouth daily.   EZETIMIBE (ZETIA) 10 MG TABLET    Take 1 tablet (10 mg total) by mouth daily.   FLUTICASONE (FLONASE) 50 MCG/ACT NASAL SPRAY    Place 1 spray into both nostrils as needed for allergies or rhinitis.   FUROSEMIDE (LASIX) 20 MG TABLET    TAKE 1 TABLET(20 MG) BY MOUTH DAILY   GLUCOSE BLOOD TEST STRIP    One Touch Ultra strips. Use to test blood sugar three times daily. Dx: E11.65   INSULIN DETEMIR (LEVEMIR FLEXTOUCH) 100 UNIT/ML FLEXPEN    Inject 25 Units into the skin at bedtime.   INSULIN PEN NEEDLE (B-D ULTRAFINE III SHORT PEN) 31G X 8 MM MISC    Use to give Insulin injections daily. Dx: E11.65   INSULIN SYRINGE-NEEDLE U-100 (INSULIN SYRINGE 1CC/31GX5/16") 31G X 5/16" 1 ML MISC    USE AS DIRECTED TO INJECT LEVIMIR   LACTULOSE (CHRONULAC) 10 GM/15ML SOLUTION    TAKE 30 ML BY MOUTH EVERY DAY   LORAZEPAM (ATIVAN) 0.5 MG TABLET    TAKE 1 TABLET(0.5 MG) BY MOUTH TWICE DAILY AS NEEDED FOR ANXIETY   LORAZEPAM (ATIVAN) 0.5 MG TABLET    Take 1 tablet (0.5 mg total) by mouth 2 (two) times daily as needed for anxiety.    MAGNESIUM PO    Take 500 mg by mouth daily.    METOPROLOL TARTRATE (LOPRESSOR) 25 MG TABLET    Take 1 tablet (25 mg total) by mouth  as needed (use as needed for HR greater than 110).   MULTIPLE VITAMINS-MINERALS (MULTIVITAMIN WITH MINERALS) TABLET    Take 1 tablet by mouth daily.   PANTOPRAZOLE (PROTONIX) 40 MG TABLET    TAKE 1 TABLET(40 MG) BY MOUTH DAILY   POLYETHYL GLYCOL-PROPYL GLYCOL (SYSTANE OP)    Place 1 drop into both eyes at bedtime.   PROBIOTIC PRODUCT (PROBIOTIC DAILY PO)    Take 1 capsule by mouth daily. Digestive Advantage Probiotic   SULFAMETHOXAZOLE-TRIMETHOPRIM (BACTRIM DS) 800-160 MG TABLET    Take 1 tablet by mouth 2 (two) times daily for 5 days.   XIFAXAN 550 MG TABS TABLET    Take 1 tablet (550 mg total) by mouth 2 (two) times daily.  Modified Medications   No medications on file  Discontinued Medications   No medications on file    Physical Exam:  There were no vitals filed for this visit. There is no height or weight on file to calculate BMI. Wt Readings from Last 3 Encounters:  01/04/22 125 lb (56.7 kg)  11/08/21 122 lb 6.4 oz (55.5 kg)  10/21/21 127 lb 9.6 oz (57.9 kg)    Physical Exam  Labs reviewed: Basic Metabolic Panel: Recent Labs    06/15/21 0904 07/26/21 1302 11/08/21 1005 01/26/22 1118  NA 140 145 142 139  K 3.8 3.6 3.3* 3.6  CL 104 107 107 105  CO2 29  --  26 26  GLUCOSE 98 126* 165* 107*  BUN 16 15 20 17   CREATININE 0.71 0.50 0.80 0.79  CALCIUM 9.2  --  9.4 9.3   Liver Function Tests: Recent Labs    06/15/21 0904 11/08/21 1005 01/26/22 1118  AST 40* 43* 40  ALT 33 35 27  ALKPHOS 90 93 60  BILITOT 2.1* 1.5* 2.1*  PROT 6.8 7.0 6.5  ALBUMIN 3.3* 3.6 3.2*   Recent Labs    03/11/21 1246 05/29/21 1842  LIPASE 26 28   No results for input(s): "AMMONIA" in the last 8760 hours. CBC: Recent Labs    06/08/21 1140 06/15/21 0904 07/26/21 1302 11/08/21 1005 01/26/22 1118  WBC 4.6 4.2  --  5.4 5.7  NEUTROABS 2,907  --   --   4.1 3.8  HGB 13.9 13.1 13.6 14.5 14.5  HCT 41.5 38.9 40.0 42.2 43.8  MCV 94.7 93.3  --  90.2 93.2  PLT 109* 97.0*  --  96* 107*   Lipid Panel: Recent Labs    01/04/22 1434  CHOL 174  HDL 89  LDLCALC 68  TRIG 91  CHOLHDL 2.0   TSH: No results for input(s): "TSH" in the last 8760 hours. A1C: Lab Results  Component Value Date   HGBA1C 10.1 (H) 01/04/2022     Assessment/Plan 1. Dysuria due to NASH wills stop bactrim and start keflex TID for 5 days while awaiting culture. Increase hydration. To notify if symptoms worsen or fail to improve.  - cephALEXin (KEFLEX) 250 MG capsule; Take 1 capsule (250 mg total) by mouth 3 (three) times daily.  Dispense: 15 capsule; Refill: 0   Tierre Netto K. Harle Battiest  [facility] [phone]    Virtual Visit via telephone   I connected with patient on 01/27/22 at  1:00 PM EDT by telephone  and verified that I am speaking with the correct person using two identifiers.  Location: Patient: home Provider: [facility]    I discussed the limitations, risks, security and privacy concerns of performing an evaluation and  management service by telephone and the availability of in person appointments. I also discussed with the patient that there may be a patient responsible charge related to this service. The patient expressed understanding and agreed to proceed.   I discussed the assessment and treatment plan with the patient. The patient was provided an opportunity to ask questions and all were answered. The patient agreed with the plan and demonstrated an understanding of the instructions.   The patient was advised to call back or seek an in-person evaluation if the symptoms worsen or if the condition fails to improve as anticipated.  I provided 14 minutes of non-face-to-face time during this encounter.  Carlos American. Harle Battiest Avs printed and mailed

## 2022-01-27 NOTE — Telephone Encounter (Signed)
Ms. chey, rachels are scheduled for a virtual visit with your provider today.    Just as we do with appointments in the office, we must obtain your consent to participate.  Your consent will be active for this visit and any virtual visit you Iktan Aikman have with one of our providers in the next 365 days.    If you have a MyChart account, I can also send a copy of this consent to you electronically.  All virtual visits are billed to your insurance company just like a traditional visit in the office.  As this is a virtual visit, video technology does not allow for your provider to perform a traditional examination.  This Deryl Giroux limit your provider's ability to fully assess your condition.  If your provider identifies any concerns that need to be evaluated in person or the need to arrange testing such as labs, EKG, etc, we will make arrangements to do so.    Although advances in technology are sophisticated, we cannot ensure that it will always work on either your end or our end.  If the connection with a video visit is poor, we Cianni Manny have to switch to a telephone visit.  With either a video or telephone visit, we are not always able to ensure that we have a secure connection.   I need to obtain your verbal consent now.   Are you willing to proceed with your visit today?   Tauni Sanks has provided verbal consent on 01/27/2022 for a virtual visit (video or telephone).   MayAlbertina [place], [place] 01/27/2022  12:45 PM

## 2022-01-28 LAB — URINE CULTURE: Culture: 20000 — AB

## 2022-01-29 DIAGNOSIS — R001 Bradycardia, unspecified: Secondary | ICD-10-CM | POA: Diagnosis not present

## 2022-02-01 ENCOUNTER — Encounter: Payer: Self-pay | Admitting: Gastroenterology

## 2022-02-01 ENCOUNTER — Ambulatory Visit (INDEPENDENT_AMBULATORY_CARE_PROVIDER_SITE_OTHER): Payer: Medicare Other | Admitting: Gastroenterology

## 2022-02-01 VITALS — BP 122/60 | HR 63 | Ht 63.0 in | Wt 126.1 lb

## 2022-02-01 DIAGNOSIS — K7460 Unspecified cirrhosis of liver: Secondary | ICD-10-CM | POA: Diagnosis not present

## 2022-02-01 DIAGNOSIS — I2510 Atherosclerotic heart disease of native coronary artery without angina pectoris: Secondary | ICD-10-CM

## 2022-02-01 NOTE — Progress Notes (Signed)
02/01/2022 [name] [id number] 1945-09-07  Review of pertinent gastrointestinal problems: 1. Personal history of adenomatous colon polyps. Colonoscopy Dr. [name] 2012 August was normal except for 2 subcentimeter polyps, one was a tubular adenoma. She was recommended to have recall colonoscopy at five-year interval. She had also had previous colonoscopies with Dr. Lajoyce Corners and found to have adenomas as well. Colonoscopy Dr. [name] 07/2016 one subCM TA, recall at 7 years 2. Cirrhosis: likely from Fatty liver Paracentesis 03/2016 4 liters, elevated SAAG, no SBP, cytology neg for malignancy imaging: 03/2017 [place]: cirrhosis, splenomegaly, NEW partial (non-occlusive) PV thrombus, Gallstones and new "mild dilation of intra and extrahepatic bile ducts"; MRI 03/2017 biliary findings probably from extensive periportal fibrosis, small gallstones in GB, non-occlusive PV thrombus appears chronic.  [place] 10/2017: gallstones, thick GB, cirrhosis without masses in liver.  04/2018 ultrasound cirrhosis without focal hepatic lesions.  MRI January 2021 showed a new 3 cm mass that was "indeterminate for neoplasm".  Interventional radiology review felt this was possibly a biloma.  Follow-up MRI April 2021 arranged by interventional radiology.  MRI abdomen April 2021 no suspicious lesions in the liver to suggest hepatocellular carcinoma.  MRI April 2022 unchanged, nothing suspicious for hepatocellular cancer. Most recent AFP: 06/2021 normal MELD-NA; 01/2022 labs: 10;  MELD 3.0 is 12 07/2016 EGD: large varices, + portal gastropathy; started nadolol after discussion with cardiology (nadolol 48m once dialy); no recall necessary since on Nadolol.  EGD November 2020 showed persistent large varices, banded with ligating bands.  See below Immunization for Hep A/B:  Started 06/2016 PV thrombus 2018 imaging, non-occlusive and chronic per MR; hematology workup and I agreed; no plans for anticoag (given large varices). Very large  varices causing dysphagia despite 80 of nadolol daily.  EGD November 2020 placement of 14 variceal ligating bands.  This was complicated by significant chest pain and dysphagia afterwards requiring hospital admission.  Further complicated by bleeding from band ulcer site and eventual TIPS placement November 2020.  She had significant encephalopathy issues following the TIPS but these eventually waned.  Doing well on lactulose 30 mL once daily and Xifaxan 550 twice daily as of July 2021.  HISTORY OF PRESENT ILLNESS: This is a pleasant 76year old female who is a patient of Dr. JArdis Hughs  She follows here for her cirrhosis.  She was last seen here in August 2022.  She is doing well.  She is on Xifaxan 550 mg twice daily and lactulose is listed at 30 mL daily for history of encephalopathy, but reports she does not take the lactulose every day.  She is having no signs of encephalopathy.  She has no issues with edema or ascites.  Her weight has been stable.  She is on Lasix 20 mg daily.  She tells me that she is having lumbar back surgery with Dr. JRonnald Rampnext month.  MRI of the abdomen in February showed no sign of hepatoma.  AFP was normal at 2.2.   Past Medical History:  Diagnosis Date   Chronic cystitis    Chronic diastolic CHF (congestive heart failure) ([place]    followed by dr nJohnsie Cancel  Chronic kidney disease, stage I    Cirrhosis of liver without ascites ([facility] 03/2016   followed by dr jArdis Hughs(GI);   due to fatty liver;   post op cabg 09/ 2017  , post paracentesis for ascites   Coronary artery disease 01/2016   cardiologist--- dr nFrances Nickels   01-27-2016  NSTEMI  s/p cardiac cath;  s/p CABG 01/29/16: LIMA-LAD, SVG-RI, SVG-dRCA   DDD (degenerative disc disease), cervical    per pt w/ chronic neck pain   Edema of right lower extremity    Esophageal varices (HCC)    followed by dr [name];   hx multiple banding of large varices   Essential hypertension    no meds   GERD (gastroesophageal reflux disease)     Heart murmur     DX FOR YEARS ASYMPTOMATIC   Hepatic encephalopathy ([place])    History of adenomatous polyp of colon    History of COVID-19 04/2020   per pt mild  symptoms that resolve   History of kidney stones    History of non-ST elevation myocardial infarction (NSTEMI) 01/27/2016   Hyperlipidemia    diet controlled   NAFLD (nonalcoholic fatty liver disease)    Neuropathy, peripheral 04/01/2015   OA (osteoarthritis)    Paroxysmal A-fib ([place])    08/19/19 afib with RVR, spontaneously converted to SR   Portal vein thrombosis    per dr [name]  ,  chronic non-occluded   Thrombocytopenia ([place])    followed by pcp  and hematology-- dr Burr Medico;   likely secondary to liver cirrhosis   Type 2 diabetes mellitus treated with insulin ([place])    followed by pcp    (07-21-2021  per pt checks blood sugar dialy in am fasting average 113--200s)   Urge incontinence of urine    Wears hearing aid in both ears    Past Surgical History:  Procedure Laterality Date   CARDIAC CATHETERIZATION N/A 01/27/2016   Procedure: Left Heart Cath and Coronary Angiography;  Surgeon: Belva Crome, MD;  Location: [place] [facility];  Service: Cardiovascular;  Laterality: N/A;   COLONOSCOPY WITH PROPOFOL N/A 07/07/2016   Procedure: COLONOSCOPY WITH PROPOFOL;  Surgeon: [name], MD;  Location: [facility];  Service: Endoscopy;  Laterality: N/A;   CORONARY ARTERY BYPASS GRAFT N/A 01/28/2016   Procedure: CORONARY ARTERY BYPASS GRAFTING (CABG) x 3 USING RIGHT LEG GREATER SAPHENOUS VEIN GRAFT;  Surgeon: Melrose Nakayama, MD;  Location: [place];  Service: Open Heart Surgery;  Laterality: N/A;   CYSTOSCOPY WITH RETROGRADE PYELOGRAM, URETEROSCOPY AND STENT PLACEMENT Left 05/30/2021   Procedure: CYSTOSCOPY WITH RETROGRADE PYELOGRAM, URETEROSCOPY AND STENT PLACEMENT;  Surgeon: Ceasar Mons, MD;  Location: [place];  Service: Urology;  Laterality: Left;   CYSTOSCOPY/URETEROSCOPY/HOLMIUM LASER/STENT PLACEMENT Left  07/26/2021   Procedure: CYSTOSCOPY/RETROGRADE/URETEROSCOPY/HOLMIUM LASER/STENT EXCHANGE;  Surgeon: Janith Lima, MD;  Location[facility];  Service: Urology;  Laterality: Left;   ENDOVEIN HARVEST OF GREATER SAPHENOUS VEIN Right 01/28/2016   Procedure: ENDOVEIN HARVEST OF GREATER SAPHENOUS VEIN;  Surgeon: Melrose Nakayama, MD;  Location: [place];  Service: Open Heart Surgery;  Laterality: Right;   ESOPHAGEAL BANDING  03/28/2019   Procedure: ESOPHAGEAL BANDING;  Surgeon: [name], MD;  Location: [facility];  Service: Endoscopy;;   ESOPHAGEAL BANDING  04/07/2019   Procedure: ESOPHAGEAL BANDING;  Surgeon: Juanita Craver, MD;  Location: [facility];  Service: Endoscopy;;   ESOPHAGOGASTRODUODENOSCOPY N/A 04/07/2019   Procedure: ESOPHAGOGASTRODUODENOSCOPY (EGD);  Surgeon: Juanita Craver, MD;  Location: [facility] END[facility]COPY;  Service: Endoscopy;  Laterality: N/A;   ESOPHAGOGASTRODUODENOSCOPY (EGD) WITH PROPOFOL N/A 07/07/2016   Procedure: ESOPHAGOGASTRODUODENOSCOPY (EGD) WITH PROPOFOL;  Surgeon: [name], MD;  Location: [facility];  Service: Endoscopy;  Laterality: N/A;   ESOPHAGOGASTRODUODENOSCOPY (EGD) WITH PROPOFOL N/A 03/28/2019   Procedure: ESOPHAGOGASTRODUODENOSCOPY (EGD) WITH PROPOFOL;  Surgeon: [name], MD;  Location: [facility];  Service: Endoscopy;  Laterality: N/A;   EXTRACORPOREAL SHOCK WAVE LITHOTRIPSY  2010   HEMOSTASIS CLIP PLACEMENT  04/07/2019   Procedure: HEMOSTASIS CLIP PLACEMENT;  Surgeon: Juanita Craver, MD;  Location: [place];  Service: Endoscopy;;   IR ANGIOGRAM SELECTIVE EACH ADDITIONAL VESSEL  04/08/2019   IR EMBO ART  VEN HEMORR LYMPH EXTRAV  INC GUIDE ROADMAPPING  04/08/2019   IR PARACENTESIS  04/08/2019   IR RADIOLOGIST EVAL & MGMT  06/13/2019   IR RADIOLOGIST EVAL & MGMT  09/25/2019   IR RADIOLOGIST EVAL & MGMT  07/07/2020   IR RADIOLOGIST EVAL & MGMT  07/30/2021   IR TIPS  04/08/2019   MAXIMUM ACCESS (MAS)POSTERIOR LUMBAR  INTERBODY FUSION (PLIF) 1 LEVEL Left 06/10/2015   Procedure: FOR MAXIMUM ACCESS (MAS) POSTERIOR LUMBAR INTERBODY FUSION (PLIF) LUMBAR THREE-FOUR EXTRAFORAMINAL MICRODISCECTOMY LUMBAR FIVE-SACRAL ONE LEFT;  Surgeon: Eustace Moore, MD;  Location: [facility];  Service: Neurosurgery;  Laterality: Left;   RADIOLOGY WITH ANESTHESIA N/A 04/08/2019   Procedure: RADIOLOGY WITH ANESTHESIA;  Surgeon: Radiologist, Medication, MD;  Location: [place];  Service: Radiology;  Laterality: N/A;   SCLEROTHERAPY  04/07/2019   Procedure: SCLEROTHERAPY;  Surgeon: Juanita Craver, MD;  Location: [facility] ENDOSCOPY;  Service: Endoscopy;;   TEE WITHOUT CARDIOVERSION N/A 01/28/2016   Procedure: TRANSESOPHAGEAL ECHOCARDIOGRAM (TEE);  Surgeon: Melrose Nakayama, MD;  Location: [place];  Service: Open Heart Surgery;  Laterality: N/A;   TUBAL LIGATION  1982   Dr Lynwood Dawley HERNIA REPAIR N/A 09/10/2019   Procedure: HERNIA REPAIR UMBILICAL ADULT;  Surgeon: Donnie Mesa, MD;  Location: [place];  Service: General;  Laterality: N/A;   VAGINAL HYSTERECTOMY  1997   Dr Rande Lawman    reports that she has never smoked. She has never used smokeless tobacco. She reports that she does not drink alcohol and does not use drugs. family history includes Arthritis in her brother and sister; Breast cancer in her maternal aunt, mother, and paternal aunt; Heart disease in her maternal grandfather, maternal grandmother, mother, paternal grandfather, and paternal grandmother; Liver cancer in her brother; Lung cancer in her father; Lymphoma in her brother; Other in her daughter. Allergies  Allergen Reactions   Kiwi Extract Anaphylaxis   Tdap [Tetanus-Diphth-Acell Pertussis] Swelling and Other (See Comments)    Swelling at injection site, gets very hot   Statins Other (See Comments)    RHABDOMYOLYSIS   Latex Itching, Dermatitis and Rash   Tramadol Nausea And Vomiting      Outpatient Encounter Medications as of 02/01/2022  Medication Sig    acetaminophen (TYLENOL) 500 MG tablet Take 1,000 mg by mouth every 6 (six) hours.   Biotin 10000 MCG TABS Take 10,000 mcg by mouth daily.   Calcium Carb-Cholecalciferol (CALCIUM 600 + D PO) Take 1 tablet by mouth in the morning and at bedtime.   cephALEXin (KEFLEX) 250 MG capsule Take 1 capsule (250 mg total) by mouth 3 (three) times daily.   Cholecalciferol (VITAMIN D-3 PO) Take 1,000 Units by mouth daily.   Cyanocobalamin (VITAMIN B 12 PO) Take 1,000 mcg by mouth daily.   empagliflozin (JARDIANCE) 25 MG TABS tablet Take 1 tablet (25 mg total) by mouth daily.   ezetimibe (ZETIA) 10 MG tablet Take 1 tablet (10 mg total) by mouth daily.   fluticasone (FLONASE) 50 MCG/ACT nasal spray Place 1 spray into both nostrils as needed for allergies or rhinitis.   furosemide (LASIX) 20 MG tablet TAKE 1 TABLET(20 MG) BY MOUTH  DAILY   glucose blood test strip One Touch Ultra strips. Use to test blood sugar three times daily. Dx: E11.65   insulin detemir (LEVEMIR FLEXTOUCH) 100 UNIT/ML FlexPen Inject 25 Units into the skin at bedtime.   Insulin Pen Needle (B-D ULTRAFINE III SHORT PEN) 31G X 8 MM MISC Use to give Insulin injections daily. Dx: E11.65   Insulin Syringe-Needle U-100 (INSULIN SYRINGE 1CC/31GX5/16") 31G X 5/16" 1 ML MISC USE AS DIRECTED TO INJECT LEVIMIR   lactulose (CHRONULAC) 10 GM/15ML solution TAKE 30 ML BY MOUTH EVERY DAY   LORazepam (ATIVAN) 0.5 MG tablet TAKE 1 TABLET(0.5 MG) BY MOUTH TWICE DAILY AS NEEDED FOR ANXIETY   LORazepam (ATIVAN) 0.5 MG tablet Take 1 tablet (0.5 mg total) by mouth 2 (two) times daily as needed for anxiety.   MAGNESIUM PO Take 500 mg by mouth daily.    metoprolol tartrate (LOPRESSOR) 25 MG tablet Take 1 tablet (25 mg total) by mouth as needed (use as needed for HR greater than 110).   Multiple Vitamins-Minerals (MULTIVITAMIN WITH MINERALS) tablet Take 1 tablet by mouth daily.   pantoprazole (PROTONIX) 40 MG tablet TAKE 1 TABLET(40 MG) BY MOUTH DAILY   Polyethyl  Glycol-Propyl Glycol (SYSTANE OP) Place 1 drop into both eyes at bedtime.   Probiotic Product (PROBIOTIC DAILY PO) Take 1 capsule by mouth daily. Digestive Advantage Probiotic   XIFAXAN 550 MG TABS tablet Take 1 tablet (550 mg total) by mouth 2 (two) times daily.   No facility-administered encounter medications on file as of 02/01/2022.    REVIEW OF SYSTEMS  : All other systems reviewed and negative except where noted in the History of Present Illness.   PHYSICAL EXAM: BP 122/60   Pulse 63   Ht 5' 3"  (1.6 m)   Wt 126 lb 2 oz (57.2 kg)   LMP  (LMP Unknown)   BMI 22.34 kg/m  General: Well developed white female in no acute distress Head: Normocephalic and atraumatic Eyes:  Sclerae anicteric, conjunctiva pink. Ears: Normal auditory acuity Lungs: Clear throughout to auscultation; no W/R/R. Heart: Regular rate and rhythm; no M/R/G. Abdomen: Soft, non-distended.  BS present.  Non-tender. Musculoskeletal: Symmetrical with no gross deformities  Skin: No lesions on visible extremities Extremities: No edema  Neurological: Alert oriented x 4, grossly non-focal.  No asterixis noted. Psychological:  Alert and cooperative. Normal mood and affect  ASSESSMENT AND PLAN: *76 year old female with NASH cirrhosis status post TIPS placement for esophageal variceal bleeding and then encephalopathy following the TIPS.  Everything has been well controlled now for the past couple of years.  She is on Xifaxan 550 mg twice daily and lactulose listed at 30 mL daily, but she says that she does not take it every day.  She is on low-dose Lasix with no obvious lower extremity edema or ascites.  Her weight has been stable.  Her updated labs are stable.  We will plan for right upper quadrant ultrasound for hepatoma screening.  Once we get results of that back we will plan for repeat imaging and labs in 6 months again.  MELD 3.0: 12 at 01/26/2022 11:18 AM MELD-Na: 10 at 01/26/2022 11:18 AM Calculated from: Serum  Creatinine: 0.79 mg/dL (Using min of 1 mg/dL) at 01/26/2022 11:18 AM Serum Sodium: 139 mmol/L (Using max of 137 mmol/L) at 01/26/2022 11:18 AM Total Bilirubin: 2.1 mg/dL at 01/26/2022 11:18 AM Serum Albumin: 3.2 g/dL at 01/26/2022 11:18 AM INR(ratio): 1.1 at 01/26/2022 11:18 AM Age at listing (hypothetical): 37 years Sex:  Female at 01/26/2022 11:18 AM    CC:  [name], MD

## 2022-02-01 NOTE — Patient Instructions (Signed)
If you are age 75 or older, your body mass index should be between 23-30. Your Body mass index is 22.34 kg/m. If this is out of the aforementioned range listed, please consider follow up with your Primary Care Provider.  If you are age 71 or younger, your body mass index should be between 19-25. Your Body mass index is 22.34 kg/m. If this is out of the aformentioned range listed, please consider follow up with your Primary Care Provider.   ________________________________________________________  The [facility] providers would like to encourage you to use [facility] to communicate with providers for non-urgent requests or questions.  Due to long hold times on the telephone, sending your provider a message by [facility] may be a faster and more efficient way to get a response.  Please allow 48 business hours for a response.  Please remember that this is for non-urgent requests.  _______________________________________________________   Dennis Bast have been scheduled for an abdominal ultrasound at [facility] (1st floor of hospital) on 02/03/22 at 11 am. Please arrive 30 minutes prior to your appointment for registration. Make certain not to have anything to eat or drink 6-8  hours prior to your appointment. Should you need to reschedule your appointment, please contact radiology at [phone]. This test typically takes about 30 minutes to perform.   Due to recent changes in healthcare laws, you may see the results of your imaging and laboratory studies on MyChart before your provider has had a chance to review them.  We understand that in some cases there may be results that are confusing or concerning to you. Not all laboratory results come back in the same time frame and the provider may be waiting for multiple results in order to interpret others.  Please give [place] 48 hours in order for your provider to thoroughly review all the results before contacting the office for clarification of your results.     It was a pleasure to see you today!  Thank you for trusting me with your gastrointestinal care!

## 2022-02-02 ENCOUNTER — Telehealth: Payer: Self-pay

## 2022-02-02 NOTE — Progress Notes (Signed)
Agree with assessment/plan.  Carmell Austria, MD [facility] [phone]

## 2022-02-02 NOTE — Telephone Encounter (Signed)
Called patient to let her know that pen needles were at the office for her to pick up. Placed up front in cabinet for her to pick up.

## 2022-02-03 ENCOUNTER — Ambulatory Visit (HOSPITAL_COMMUNITY)
Admission: RE | Admit: 2022-02-03 | Discharge: 2022-02-03 | Disposition: A | Discharge status: Home / Self Care | Payer: Medicare Other | Source: Ambulatory Visit | Attending: Gastroenterology | Admitting: Gastroenterology

## 2022-02-03 DIAGNOSIS — K7460 Unspecified cirrhosis of liver: Secondary | ICD-10-CM | POA: Diagnosis not present

## 2022-02-07 DIAGNOSIS — R001 Bradycardia, unspecified: Secondary | ICD-10-CM | POA: Diagnosis not present

## 2022-02-09 ENCOUNTER — Telehealth: Payer: Self-pay

## 2022-02-09 DIAGNOSIS — E1169 Type 2 diabetes mellitus with other specified complication: Secondary | ICD-10-CM

## 2022-02-09 DIAGNOSIS — Z794 Long term (current) use of insulin: Secondary | ICD-10-CM

## 2022-02-09 MED ORDER — GLUCOSE BLOOD VI STRP: ORAL_STRIP | 3 refills | Status: DC

## 2022-02-09 MED ORDER — EZETIMIBE 10 MG PO TABS: 10.0000 mg | ORAL_TABLET | Freq: Every day | ORAL | 2 refills | Status: DC

## 2022-02-09 NOTE — Telephone Encounter (Signed)
Pharmacy send rx request for Ezetimibe 10 mg daily and glucose test strips.

## 2022-02-11 ENCOUNTER — Telehealth: Payer: Self-pay | Admitting: Pharmacy Technician

## 2022-02-11 DIAGNOSIS — Z596 Low income: Secondary | ICD-10-CM

## 2022-02-11 NOTE — Progress Notes (Signed)
[facility])                                            [place] Team    02/11/2022  Karuna Balducci Feb 18, 1946 [id number]                                      Medication Assistance Referral-FOR 2024 RE ENROLLMENT  Referral From: [place]  Medication/Company: [name] Patient application portion:  Education officer, museum portion: Faxed  to [employer], NP Provider address/fax verified via: [employer]. Vivianne Carles, [place]  [phone]

## 2022-02-16 ENCOUNTER — Encounter (HOSPITAL_COMMUNITY): Payer: Self-pay

## 2022-02-16 NOTE — Pre-Procedure Instructions (Signed)
Surgical Instructions    Your procedure is scheduled on Friday, October 20th.  Report to [facility] Main Entrance "A" at 08:15 A.M., then check in with the Admitting office.  Call this number if you have problems the morning of surgery:  [phone]   If you have any questions prior to your surgery date call [phone]: Open Monday-Friday 8am-4pm    Remember:  Do not eat or drink after midnight the night before your surgery     Take these medicines the morning of surgery with A SIP OF WATER  ezetimibe (ZETIA)  pantoprazole (PROTONIX)  XIFAXAN  If needed: acetaminophen (TYLENOL) fluticasone (FLONASE)  LORazepam (ATIVAN)  metoprolol tartrate (LOPRESSOR)     As of today, STOP taking any Aspirin (unless otherwise instructed by your surgeon) Aleve, Naproxen, Ibuprofen, Motrin, Advil, Goody's, BC's, all herbal medications, fish oil, and all vitamins.   WHAT DO I DO ABOUT MY DIABETES MEDICATION?   Hold empagliflozin (JARDIANCE) 72 hours prior to surgery. Last dose 10/16.  THE NIGHT BEFORE SURGERY, take 15 units (50%) of insulin detemir (LEVEMIR FLEXTOUCH).        HOW TO MANAGE YOUR DIABETES BEFORE AND AFTER SURGERY  Why is it important to control my blood sugar before and after surgery? Improving blood sugar levels before and after surgery helps healing and can limit problems. A way of improving blood sugar control is eating a healthy diet by:  Eating less sugar and carbohydrates  Increasing activity/exercise  Talking with your doctor about reaching your blood sugar goals High blood sugars (greater than 180 mg/dL) can raise your risk of infections and slow your recovery, so you will need to focus on controlling your diabetes during the weeks before surgery. Make sure that the doctor who takes care of your diabetes knows about your planned surgery including the date and location.  How do I manage my blood sugar before surgery? Check your blood sugar at least 4 times a  day, starting 2 days before surgery, to make sure that the level is not too high or low.  Check your blood sugar the morning of your surgery when you wake up and every 2 hours until you get to the Short Stay unit.  If your blood sugar is less than 70 mg/dL, you will need to treat for low blood sugar: Do not take insulin. Treat a low blood sugar (less than 70 mg/dL) with  cup of clear juice (cranberry or apple), 4 glucose tablets, OR glucose gel. Recheck blood sugar in 15 minutes after treatment (to make sure it is greater than 70 mg/dL). If your blood sugar is not greater than 70 mg/dL on recheck, call [phone] for further instructions. Report your blood sugar to the short stay nurse when you get to Short Stay.  If you are admitted to the hospital after surgery: Your blood sugar will be checked by the staff and you will probably be given insulin after surgery (instead of oral diabetes medicines) to make sure you have good blood sugar levels. The goal for blood sugar control after surgery is 80-180 mg/dL.                   Do NOT Smoke (Tobacco/Vaping) for 24 hours prior to your procedure.  If you use a CPAP at night, you may bring your mask/headgear for your overnight stay.   Contacts, glasses, piercing's, hearing aid's, dentures or partials may not be worn into surgery, please bring cases for these belongings.  For patients admitted to the hospital, discharge time will be determined by your treatment team.   Patients discharged the day of surgery will not be allowed to drive home, and someone needs to stay with them for 24 hours.  SURGICAL WAITING ROOM VISITATION Patients having surgery or a procedure may have no more than 2 support people in the waiting area - these visitors may rotate.   Children under the age of 78 must have an adult with them who is not the patient. If the patient needs to stay at the hospital during part of their recovery, the visitor guidelines for inpatient  rooms apply. Pre-op nurse will coordinate an appropriate time for 1 support person to accompany patient in pre-op.  This support person may not rotate.   Please refer to the [facility] website for the visitor guidelines for Inpatients (after your surgery is over and you are in a regular room).    Special instructions:   [facility]- Preparing For Surgery  Before surgery, you can play an important role. Because skin is not sterile, your skin needs to be as free of germs as possible. You can reduce the number of germs on your skin by washing with CHG (chlorahexidine gluconate) Soap before surgery.  CHG is an antiseptic cleaner which kills germs and bonds with the skin to continue killing germs even after washing.    Oral Hygiene is also important to reduce your risk of infection.  Remember - BRUSH YOUR TEETH THE MORNING OF SURGERY WITH YOUR REGULAR TOOTHPASTE  Please do not use if you have an allergy to CHG or antibacterial soaps. If your skin becomes reddened/irritated stop using the CHG.  Do not shave (including legs and underarms) for at least 48 hours prior to first CHG shower. It is OK to shave your face.  Please follow these instructions carefully.   Shower the NIGHT BEFORE SURGERY and the MORNING OF SURGERY  If you chose to wash your hair, wash your hair first as usual with your normal shampoo.  After you shampoo, rinse your hair and body thoroughly to remove the shampoo.  Use CHG Soap as you would any other liquid soap. You can apply CHG directly to the skin and wash gently with a scrungie or a clean washcloth.   Apply the CHG Soap to your body ONLY FROM THE NECK DOWN.  Do not use on open wounds or open sores. Avoid contact with your eyes, ears, mouth and genitals (private parts). Wash Face and genitals (private parts)  with your normal soap.   Wash thoroughly, paying special attention to the area where your surgery will be performed.  Thoroughly rinse your body with warm water  from the neck down.  DO NOT shower/wash with your normal soap after using and rinsing off the CHG Soap.  Pat yourself dry with a CLEAN TOWEL.  Wear CLEAN PAJAMAS to bed the night before surgery  Place CLEAN SHEETS on your bed the night before your surgery  DO NOT SLEEP WITH PETS.   Day of Surgery: Take a shower with CHG soap. Do not wear jewelry or makeup Do not wear lotions, powders, perfumes, or deodorant. Do not shave 48 hours prior to surgery.   Do not bring valuables to the hospital. [facility] is not responsible for any belongings or valuables. Do not wear nail polish, gel polish, artificial nails, or any other type of covering on natural nails (fingers and toes) If you have artificial nails or gel coating that  need to be removed by a nail salon, please have this removed prior to surgery. Artificial nails or gel coating may interfere with anesthesia's ability to adequately monitor your vital signs. Wear Clean/Comfortable clothing the morning of surgery Remember to brush your teeth WITH YOUR REGULAR TOOTHPASTE.   Please read over the following fact sheets that you were given.    If you received a COVID test during your pre-op visit  it is requested that you wear a mask when out in public, stay away from anyone that may not be feeling well and notify your surgeon if you develop symptoms. If you have been in contact with anyone that has tested positive in the last 10 days please notify you surgeon.

## 2022-02-16 NOTE — Progress Notes (Signed)
PCP - Dr Alain Honey Cardiologist - Dr [name]  Chest x-ray - 03/11/21 (2V) EKG - 01/27/22 Stress Test - n/a ECHO - 08/20/19 Cardiac Cath - 01/27/16  ICD Pacemaker/Loop - n/a  Sleep Study -  n/a CPAP - none  Hold Jardiance for 72 hours prior to procedure.  Last dose will be on 02/21/22.  THE NIGHT BEFORE SURGERY, take 15 units of Levemir Insulin.    If your blood sugar is less than 70 mg/dL, you will need to treat for low blood sugar: Treat a low blood sugar (less than 70 mg/dL) with  cup of clear juice (cranberry or apple), 4 glucose tablets, OR glucose gel. Recheck blood sugar in 15 minutes after treatment (to make sure it is greater than 70 mg/dL). If your blood sugar is not greater than 70 mg/dL on recheck, call [phone] for further instructions.  Anesthesia review: Yes  STOP now taking any Aspirin (unless otherwise instructed by your surgeon), Aleve, Naproxen, Ibuprofen, Motrin, Advil, Goody's, BC's, all herbal medications, fish oil, and all vitamins.   Coronavirus Screening Do you have any of the following symptoms:  Cough Yes-occasional Fever (>100.53F)  yes/no: No Runny nose yes/no: No Sore throat yes/no: No Difficulty breathing/shortness of breath  yes/no: No  Have you traveled in the last 14 days and where? yes/no: No  Patient verbalized understanding of instructions that were given to them at the PAT appointment. Patient was also instructed that they will need to review over the PAT instructions again at home before surgery.

## 2022-02-17 ENCOUNTER — Encounter (HOSPITAL_COMMUNITY): Payer: Self-pay

## 2022-02-17 ENCOUNTER — Encounter (HOSPITAL_COMMUNITY)
Admission: RE | Admit: 2022-02-17 | Discharge: 2022-02-17 | Disposition: A | Discharge status: Home / Self Care | Payer: Medicare Other | Source: Ambulatory Visit | Attending: Neurological Surgery | Admitting: Neurological Surgery

## 2022-02-17 ENCOUNTER — Telehealth: Payer: Self-pay | Admitting: *Deleted

## 2022-02-17 ENCOUNTER — Other Ambulatory Visit: Payer: Self-pay

## 2022-02-17 DIAGNOSIS — Z01812 Encounter for preprocedural laboratory examination: Secondary | ICD-10-CM | POA: Diagnosis not present

## 2022-02-17 DIAGNOSIS — Z01818 Encounter for other preprocedural examination: Secondary | ICD-10-CM

## 2022-02-17 HISTORY — DX: Dyspnea, unspecified: R06.00

## 2022-02-17 LAB — COMPREHENSIVE METABOLIC PANEL
ALT: 34 U/L (ref 0–44)
AST: 49 U/L — ABNORMAL HIGH (ref 15–41)
Albumin: 3.4 g/dL — ABNORMAL LOW (ref 3.5–5.0)
Alkaline Phosphatase: 77 U/L (ref 38–126)
Anion gap: 4 — ABNORMAL LOW (ref 5–15)
BUN: 11 mg/dL (ref 8–23)
CO2: 26 mmol/L (ref 22–32)
Calcium: 9.1 mg/dL (ref 8.9–10.3)
Chloride: 110 mmol/L (ref 98–111)
Creatinine, Ser: 0.74 mg/dL (ref 0.44–1.00)
GFR, Estimated: >60 mL/min (ref >60)
Glucose, Bld: 141 mg/dL — ABNORMAL HIGH (ref 70–99)
Potassium: 4.2 mmol/L (ref 3.5–5.1)
Sodium: 140 mmol/L (ref 135–145)
Total Bilirubin: 1.9 mg/dL — ABNORMAL HIGH (ref 0.3–1.2)
Total Protein: 6.7 g/dL (ref 6.5–8.1)

## 2022-02-17 LAB — CBC
HCT: 41.9 % (ref 36.0–46.0)
Hemoglobin: 14.2 g/dL (ref 12.0–15.0)
MCH: 31.4 pg (ref 26.0–34.0)
MCHC: 33.9 g/dL (ref 30.0–36.0)
MCV: 92.7 fL (ref 80.0–100.0)
Platelets: 103 10*3/uL — ABNORMAL LOW (ref 150–400)
RBC: 4.52 MIL/uL (ref 3.87–5.11)
RDW: 13.8 % (ref 11.5–15.5)
WBC: 4.2 10*3/uL (ref 4.0–10.5)
nRBC: 0 % (ref 0.0–0.2)

## 2022-02-17 LAB — SURGICAL PCR SCREEN
MRSA, PCR: NEGATIVE
Staphylococcus aureus: NEGATIVE

## 2022-02-17 LAB — GLUCOSE, CAPILLARY: Glucose-Capillary: 119 mg/dL — ABNORMAL HIGH (ref 70–99)

## 2022-02-17 LAB — PROTIME-INR
INR: 1.1 (ref 0.8–1.2)
Prothrombin Time: 14.4 seconds (ref 11.4–15.2)

## 2022-02-17 NOTE — Telephone Encounter (Signed)
Received Levemir FlexPens (3 Boxes)  in the mail from Patient [place] in Refrigerator and patient notified to pick up.

## 2022-02-18 ENCOUNTER — Ambulatory Visit: Payer: Medicare Other | Attending: Physician Assistant | Admitting: Physician Assistant

## 2022-02-18 ENCOUNTER — Encounter: Payer: Self-pay | Admitting: Physician Assistant

## 2022-02-18 VITALS — BP 90/58 | HR 63 | Ht 63.0 in | Wt 128.0 lb

## 2022-02-18 DIAGNOSIS — I2510 Atherosclerotic heart disease of native coronary artery without angina pectoris: Secondary | ICD-10-CM | POA: Insufficient documentation

## 2022-02-18 DIAGNOSIS — I480 Paroxysmal atrial fibrillation: Secondary | ICD-10-CM | POA: Insufficient documentation

## 2022-02-18 DIAGNOSIS — R001 Bradycardia, unspecified: Secondary | ICD-10-CM | POA: Diagnosis not present

## 2022-02-18 DIAGNOSIS — I5189 Other ill-defined heart diseases: Secondary | ICD-10-CM

## 2022-02-18 DIAGNOSIS — I340 Nonrheumatic mitral (valve) insufficiency: Secondary | ICD-10-CM | POA: Diagnosis not present

## 2022-02-18 DIAGNOSIS — I10 Essential (primary) hypertension: Secondary | ICD-10-CM | POA: Insufficient documentation

## 2022-02-18 NOTE — Anesthesia Preprocedure Evaluation (Addendum)
Anesthesia Evaluation  Patient identified by MRN, date of birth, ID band Patient awake    Reviewed: Allergy & Precautions, NPO status , Patient's Chart, lab work & pertinent test results  Airway Mallampati: II  TM Distance: >3 FB Neck ROM: Full    Dental no notable dental hx.    Pulmonary neg pulmonary ROS,    Pulmonary exam normal        Cardiovascular hypertension, Pt. on medications and Pt. on home beta blockers + CAD, + Past MI, + CABG and +CHF  + dysrhythmias Atrial Fibrillation  Rhythm:Regular Rate:Normal     Neuro/Psych negative neurological ROS  negative psych ROS   GI/Hepatic GERD  Medicated,(+) Cirrhosis   Esophageal Varices    ,   Endo/Other  diabetes, Type 2, Insulin Dependent  Renal/GU CRFRenal disease  negative genitourinary   Musculoskeletal  (+) Arthritis , Osteoarthritis,    Abdominal Normal abdominal exam  (+)   Peds  Hematology  (+) Blood dyscrasia, anemia ,   Anesthesia Other Findings   Reproductive/Obstetrics                            Anesthesia Physical Anesthesia Plan  ASA: 3  Anesthesia Plan: General   Post-op Pain Management: Gabapentin PO (pre-op)*   Induction: Intravenous  PONV Risk Score and Plan: 3 and Ondansetron, Dexamethasone and Treatment may vary due to age or medical condition  Airway Management Planned: Mask and Oral ETT  Additional Equipment: None  Intra-op Plan:   Post-operative Plan: Extubation in OR  Informed Consent: I have reviewed the patients History and Physical, chart, labs and discussed the procedure including the risks, benefits and alternatives for the proposed anesthesia with the patient or authorized representative who has indicated his/her understanding and acceptance.     Dental advisory given  Plan Discussed with: CRNA  Anesthesia Plan Comments: (PAT note by Karoline Caldwell, PA-C: Follows with cardiology for hx of  HTN, CADs/p CABG, PVCs, A-fib(Not onanticoagulation due to history of portal vein thrombosis and large vaices with prior bleeding),diastolic heart failure, asymptomatic bradycardia. Seen in ER 01/26/2022 as a transfer from urgent care for concern for bradycardia. She was initially presented for dysuria. Heart rate was in 30s which spontaneously improved to 70s. Treated with antibiotic for UTI. Dr. Soundra Pilon recommended monitor given asymptomatic bradycardia. Last seen by Robbie Lis, PA-C 02/18/22 and upcoming surgery discussed. Per note, "-Patient is scheduled to have back surgery next week. She is easily getting greater than 4 METS of activity. She will be cleared at acceptable risk. 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 Last seen 02/01/22. Per note, "Everything has been well controlled now for the past couple of years. She is on Xifaxan 550 mg twice daily and lactulose listed at 30 mL daily, but she says that she does not take it every day. She is on low-dose Lasix with no obvious lower extremity edema or ascites. Her weight has been stable. Her updated labs are stable. We will plan for right upper quadrant ultrasound for hepatoma screening. Once we get results of that back we will plan for repeat imaging and labs in 6 months again." MELD score at that time was 13.  IDDM 2, uncontrolled, last A1c 7.1 on 01/05/2019.  Preop labs reviewed, mild thrombocytopenia with platelets 103k, unremarkable.  EKG 01/26/2022: Sinus bradycardia.  Rate 43.  IVCD, consider atypical LBBB  Event monitor 02/10/22: Patient had a min HR of 54 bpm, max HR of 148 bpm, and avg HR of 69 bpm. Predominant underlying rhythm was Sinus Rhythm. Bundle Branch Block/IVCD was present. 5 Supraventricular Tachycardia runs occurred, the run with  the fastest interval lasting 6 beats with a max rate of 148 bpm, the longest lasting 8 beats with an avg rate of 119 bpm. Isolated SVEs were rare (<1.0%), SVE Couplets were rare (<1.0%), and SVE Triplets were rare (<1.0%). Isolated VEs were rare (<1.0%), and no VE Couplets or VE Triplets  were present. Inverted QRS complexes possibly due to inverted placement of device.  TTE 08/20/19: 1. Left ventricular ejection fraction, by estimation, is 60 to 65%. The  left ventricle has normal function. The left ventricle has no regional  wall motion abnormalities. Left ventricular diastolic parameters are  consistent with Grade II diastolic  dysfunction (pseudonormalization). Elevated left atrial pressure.  2. Right ventricular systolic function is normal. The right ventricular  size is normal. There is normal pulmonary artery systolic pressure. The  estimated right ventricular systolic pressure is 94.3 mmHg.  3. Left atrial size was mildly dilated.  4. The mitral valve is normal in structure. Mild mitral valve  regurgitation.  5. Tricuspid valve regurgitation is mild to moderate.  6. The aortic valve is normal in structure. Aortic valve regurgitation is  trivial.  7. The inferior vena cava is normal in size with greater than 50%  respiratory variability, suggesting right atrial pressure of 3 mmHg.   Comparison(s): Prior images unable to be directly viewed, comparison made  by report only.  )       Anesthesia Quick Evaluation

## 2022-02-18 NOTE — Progress Notes (Signed)
Anesthesia Chart Review:  Follows with cardiology for hx of HTN, CAD s/p CABG, PVCs, A-fib (Not on anticoagulation due to history of portal vein thrombosis and large vaices with prior bleeding), diastolic heart failure, asymptomatic bradycardia. Seen in ER 01/26/2022 as a transfer from urgent care for concern for bradycardia.  She was initially presented for dysuria.  Heart rate was in 30s which spontaneously improved to 70s.  Treated with antibiotic for UTI. Dr. Sallyanne Kuster who recommended monitor given asymptomatic bradycardia. Last seen by Robbie Lis, PA-C 02/18/22 and upcoming surgery discussed. Per note, "-Patient is scheduled to have back surgery next week.  She is easily getting greater than 4 METS of activity.  She will be cleared at acceptable risk.  Recommended to monitor on telemetry during surgery and postoperatively given recent bradycardia."  NASH cirrhosis status post TIPS placement 03/2019 for esophageal variceal bleeding and then encephalopathy following the TIPS. Last seen 02/01/22. Per note, "Everything has been well controlled now for the past couple of years.  She is on Xifaxan 550 mg twice daily and lactulose listed at 30 mL daily, but she says that she does not take it every day.  She is on low-dose Lasix with no obvious lower extremity edema or ascites.  Her weight has been stable.  Her updated labs are stable.  We will plan for right upper quadrant ultrasound for hepatoma screening.  Once we get results of that back we will plan for repeat imaging and labs in 6 months again." MELD score at that time was 13.  IDDM 2, uncontrolled, last A1c 7.1 on 01/05/2019.  Preop labs reviewed, mild thrombocytopenia with platelets 103k, unremarkable.  EKG 01/26/2022: Sinus bradycardia.  Rate 43.  IVCD, consider atypical LBBB  Event monitor 02/10/22: Patient had a min HR of 54 bpm, max HR of 148 bpm, and avg HR of 69 bpm. Predominant underlying rhythm was Sinus Rhythm. Bundle Branch Block/IVCD was  present. 5 Supraventricular Tachycardia runs occurred, the run with the fastest interval lasting 6 beats  with a max rate of 148 bpm, the longest lasting 8 beats with an avg rate of 119 bpm. Isolated SVEs were rare (<1.0%), SVE Couplets were rare (<1.0%), and SVE Triplets were rare (<1.0%). Isolated VEs were rare (<1.0%), and no VE Couplets or VE Triplets  were present. Inverted QRS complexes possibly due to inverted placement of device.  TTE 08/20/19:  1. Left ventricular ejection fraction, by estimation, is 60 to 65%. The  left ventricle has normal function. The left ventricle has no regional  wall motion abnormalities. Left ventricular diastolic parameters are  consistent with Grade II diastolic  dysfunction (pseudonormalization). Elevated left atrial pressure.   2. Right ventricular systolic function is normal. The right ventricular  size is normal. There is normal pulmonary artery systolic pressure. The  estimated right ventricular systolic pressure is 22.0 mmHg.   3. Left atrial size was mildly dilated.   4. The mitral valve is normal in structure. Mild mitral valve  regurgitation.   5. Tricuspid valve regurgitation is mild to moderate.   6. The aortic valve is normal in structure. Aortic valve regurgitation is  trivial.   7. The inferior vena cava is normal in size with greater than 50%  respiratory variability, suggesting right atrial pressure of 3 mmHg.   Comparison(s): Prior images unable to be directly viewed, comparison made  by report only.     Wynonia Musty [facility] Short Stay Center/Anesthesiology Phone [phone] 02/18/2022 4:17 PM

## 2022-02-18 NOTE — Progress Notes (Signed)
Cardiology Office Note:    Date:  02/18/2022   ID:  [name], [name] 03/03/1946, MRN [id number]  PCP:  [name], MD  [facility] Cardiologist:  [name], MD  [place] Electrophysiologist:  None   Chief Complaint: monitor follow up  History of Present Illness:    [name] is a 76 y.o. female with a hx of HTN, CAD s/p CABG, PVCs, A-fib (Not on anticoagulation due to history of portal vein thrombosis and large vaices with prior bleeding), CHF, DM, Ascites/cirrhosis and bilateral carotid artery dx seen for follow up.   Previous STEMI in 2017 followed by CABG with LIMA to LAD, SVG to IM, SVG to PDA with an LVEF at that time at 55 to 60%. She had postoperative ascites and cirrhosis with multiple paracentesis followed by GI.   She was in the ED 08/19/2019 for chest pain at which she was found to have new onset atrial fibrillation. While in the ED, she spontaneously converted to normal sinus rhythm. She was started on IV Lasix and IV heparin. Long term anticoagulation was not initiated due to history of portal vein thrombosis given a large varices and prior bleeding from band ulcer site s/p TIPS per Dr. [name] with GI.  Seen in ER 01/26/2022 as a transfer from urgent care for concern for bradycardia.  She was initially presented for dysuria.  Heart rate was in 30s which spontaneously improved to 70s.  Treated with antibiotic for UTI.  Discussed with DOD Dr. Sallyanne Kuster who recommended monitor given asymptomatic bradycardia.  Monitor 02/10/2022 Patch Wear Time:  2 days and 17 hours (2023-09-23T15:03:34-0400 to 2023-09-26T08:08:25-0400)   Patient had a min HR of 54 bpm, max HR of 148 bpm, and avg HR of 69 bpm. Predominant underlying rhythm was Sinus Rhythm. Bundle Branch Block/IVCD was present. 5 Supraventricular Tachycardia runs occurred, the run with the fastest interval lasting 6 beats  with a max rate of 148 bpm, the longest lasting 8 beats with an avg  rate of 119 bpm. Isolated SVEs were rare (<1.0%), SVE Couplets were rare (<1.0%), and SVE Triplets were rare (<1.0%). Isolated VEs were rare (<1.0%), and no VE Couplets or VE Triplets  were present. Inverted QRS complexes possibly due to inverted placement of device.  Here today for follow-up.  No recurrent episode of documented bradycardia.  Denies chest pain, shortness of breath, orthopnea, PND, syncope, lower extremity edema or melena.  Not on daily rate control agent.  She has as needed metoprolol for breakthrough palpitations/elevated heart rate.  Compliant with medication.  Active doing household chores.  She has upcoming back surgery.  Past Medical History:  Diagnosis Date   Chronic cystitis    Chronic diastolic CHF (congestive heart failure) ([place])    followed by dr Johnsie Cancel   Chronic kidney disease, stage I    Cirrhosis of liver without ascites ([place]) 03/2016   followed by dr [name] (GI);   due to fatty liver;   post op cabg 09/ 2017  , post paracentesis for ascites   Coronary artery disease 01/2016   cardiologist--- dr Frances Nickels;   01-27-2016  NSTEMI  s/p cardiac cath;   s/p CABG 01/29/16: LIMA-LAD, SVG-RI, SVG-dRCA   DDD (degenerative disc disease), cervical    per pt w/ chronic neck pain   Dyspnea    occasional   Edema of right lower extremity    Esophageal varices ([place])    followed by dr [name];   hx multiple banding of large varices  Essential hypertension    no meds   GERD (gastroesophageal reflux disease)    Heart murmur     DX FOR YEARS ASYMPTOMATIC   Hepatic encephalopathy (HCC)    History of adenomatous polyp of colon    History of COVID-19 04/2020   per pt mild  symptoms that resolve   History of kidney stones    passed stones   History of non-ST elevation myocardial infarction (NSTEMI) 01/27/2016   Hyperlipidemia    diet controlled   NAFLD (nonalcoholic fatty liver disease)    Neuropathy, peripheral 04/01/2015   OA (osteoarthritis)    Paroxysmal A-fib ([place])     08/19/19 afib with RVR, spontaneously converted to SR   Portal vein thrombosis    per dr [name]  ,  chronic non-occluded   Thrombocytopenia ([place])    followed by pcp  and hematology-- dr Burr Medico;   likely secondary to liver cirrhosis   Type 2 diabetes mellitus treated with insulin ([place])    Type 2, followed by pcp    (07-21-2021  per pt checks blood sugar dialy in am fasting average 113--200s)   Urge incontinence of urine    Wears hearing aid in both ears     Past Surgical History:  Procedure Laterality Date   CARDIAC CATHETERIZATION N/A 01/27/2016   Procedure: Left Heart Cath and Coronary Angiography;  Surgeon: Belva Crome, MD;  Location: [place] [facility];  Service: Cardiovascular;  Laterality: N/A;   COLONOSCOPY WITH PROPOFOL N/A 07/07/2016   Procedure: COLONOSCOPY WITH PROPOFOL;  Surgeon: [name], MD;  Location: [facility];  Service: Endoscopy;  Laterality: N/A;   CORONARY ARTERY BYPASS GRAFT N/A 01/28/2016   Procedure: CORONARY ARTERY BYPASS GRAFTING (CABG) x 3 USING RIGHT LEG GREATER SAPHENOUS VEIN GRAFT;  Surgeon: Melrose Nakayama, MD;  Location: [facility];  Service: Open Heart Surgery;  Laterality: N/A;   CYSTOSCOPY WITH RETROGRADE PYELOGRAM, URETEROSCOPY AND STENT PLACEMENT Left 05/30/2021   Procedure: CYSTOSCOPY WITH RETROGRADE PYELOGRAM, URETEROSCOPY AND STENT PLACEMENT;  Surgeon: Ceasar Mons, MD;  Location: [facility];  Service: Urology;  Laterality: Left;   CYSTOSCOPY/URETEROSCOPY/HOLMIUM LASER/STENT PLACEMENT Left 07/26/2021   Procedure: CYSTOSCOPY/RETROGRADE/URETEROSCOPY/HOLMIUM LASER/STENT EXCHANGE;  Surgeon: Janith Lima, MD;  Location: [facility];  Service: Urology;  Laterality: Left;   ENDOVEIN HARVEST OF GREATER SAPHENOUS VEIN Right 01/28/2016   Procedure: ENDOVEIN HARVEST OF GREATER SAPHENOUS VEIN;  Surgeon: Melrose Nakayama, MD;  Location: [place];  Service: Open Heart Surgery;  Laterality: Right;   ESOPHAGEAL BANDING  03/28/2019    Procedure: ESOPHAGEAL BANDING;  Surgeon: [name], MD;  Location: [facility];  Service: Endoscopy;;   ESOPHAGEAL BANDING  04/07/2019   Procedure: ESOPHAGEAL BANDING;  Surgeon: Juanita Craver, MD;  Location: [facility] END[facility];  Service: Endoscopy;;   ESOPHAGOGASTRODUODENOSCOPY N/A 04/07/2019   Procedure: ESOPHAGOGASTRODUODENOSCOPY (EGD);  Surgeon: Juanita Craver, MD;  Location: [facility];  Service: Endoscopy;  Laterality: N/A;   ESOPHAGOGASTRODUODENOSCOPY (EGD) WITH PROPOFOL N/A 07/07/2016   Procedure: ESOPHAGOGASTRODUODENOSCOPY (EGD) WITH PROPOFOL;  Surgeon: [name], MD;  [facility];  Service: Endoscopy;  Laterality: N/A;   ESOPHAGOGASTRODUODENOSCOPY (EGD) WITH PROPOFOL N/A 03/28/2019   Procedure: ESOPHAGOGASTRODUODENOSCOPY (EGD) WITH PROPOFOL;  Surgeon: [name], MD;  Location: [facility];  Service: Endoscopy;  Laterality: N/A;   EXTRACORPOREAL SHOCK WAVE LITHOTRIPSY  2010   HEMOSTASIS CLIP PLACEMENT  04/07/2019   Procedure: HEMOSTASIS CLIP PLACEMENT;  Surgeon: Juanita Craver, MD;  Location: [place] [facility];  Service: Endoscopy;;  IR ANGIOGRAM SELECTIVE EACH ADDITIONAL VESSEL  04/08/2019   IR EMBO ART  VEN HEMORR LYMPH EXTRAV  INC GUIDE ROADMAPPING  04/08/2019   IR PARACENTESIS  04/08/2019   IR RADIOLOGIST EVAL & MGMT  06/13/2019   IR RADIOLOGIST EVAL & MGMT  09/25/2019   IR RADIOLOGIST EVAL & MGMT  07/07/2020   IR RADIOLOGIST EVAL & MGMT  07/30/2021   IR TIPS  04/08/2019   MAXIMUM ACCESS (MAS)POSTERIOR LUMBAR INTERBODY FUSION (PLIF) 1 LEVEL Left 06/10/2015   Procedure: FOR MAXIMUM ACCESS (MAS) POSTERIOR LUMBAR INTERBODY FUSION (PLIF) LUMBAR THREE-FOUR EXTRAFORAMINAL MICRODISCECTOMY LUMBAR FIVE-SACRAL ONE LEFT;  Surgeon: Eustace Moore, MD;  Location: [place] [facility];  Service: Neurosurgery;  Laterality: Left;   RADIOLOGY WITH ANESTHESIA N/A 04/08/2019   Procedure: RADIOLOGY WITH ANESTHESIA;  Surgeon: Radiologist, Medication, MD;  Location: [place];  Service: Radiology;   Laterality: N/A;   SCLEROTHERAPY  04/07/2019   Procedure: SCLEROTHERAPY;  Surgeon: Juanita Craver, MD;  Location: [facility] ENDOSCOPY;  Service: Endoscopy;;   TEE WITHOUT CARDIOVERSION N/A 01/28/2016   Procedure: TRANSESOPHAGEAL ECHOCARDIOGRAM (TEE);  Surgeon: Melrose Nakayama, MD;  Location: [place];  Service: Open Heart Surgery;  Laterality: N/A;   TUBAL LIGATION  1982   Dr Lynwood Dawley HERNIA REPAIR N/A 09/10/2019   Procedure: HERNIA REPAIR UMBILICAL ADULT;  Surgeon: Donnie Mesa, MD;  Location: [place];  Service: General;  Laterality: N/A;   VAGINAL HYSTERECTOMY  1997   Dr Rande Lawman    Current Medications: Current Meds  Medication Sig   acetaminophen (TYLENOL) 650 MG CR tablet Take 1,300 mg by mouth every 8 (eight) hours as needed for pain.   Biotin 10000 MCG TABS Take 10,000 mcg by mouth daily.   Calcium Carb-Cholecalciferol (CALCIUM 600 + D PO) Take 1 tablet by mouth in the morning and at bedtime.   Cholecalciferol (VITAMIN D-3) 25 MCG (1000 UT) CAPS Take 1,000 Units by mouth daily.   Cyanocobalamin (VITAMIN B 12 PO) Take 1,000 mcg by mouth daily.   empagliflozin (JARDIANCE) 25 MG TABS tablet Take 1 tablet (25 mg total) by mouth daily.   ezetimibe (ZETIA) 10 MG tablet Take 1 tablet (10 mg total) by mouth daily.   fluticasone (FLONASE) 50 MCG/ACT nasal spray Place 1 spray into both nostrils daily as needed for allergies or rhinitis.   furosemide (LASIX) 20 MG tablet TAKE 1 TABLET(20 MG) BY MOUTH DAILY   glucose blood test strip One Touch Ultra Blue test strips. Use to test blood sugar three times daily. Dx: E11.65   insulin detemir (LEVEMIR FLEXTOUCH) 100 UNIT/ML FlexPen Inject 25 Units into the skin at bedtime. (Patient taking differently: Inject 30 Units into the skin at bedtime.)   Insulin Pen Needle (B-D ULTRAFINE III SHORT PEN) 31G X 8 MM MISC Use to give Insulin injections daily. Dx: E11.65   Insulin Syringe-Needle U-100 (INSULIN SYRINGE 1CC/31GX5/16") 31G X 5/16" 1 ML MISC USE AS  DIRECTED TO INJECT LEVIMIR   lactulose (CHRONULAC) 10 GM/15ML solution TAKE 30 ML BY MOUTH EVERY DAY (Patient taking differently: Take 20 g by mouth every 3 (three) days.)   LORazepam (ATIVAN) 0.5 MG tablet TAKE 1 TABLET(0.5 MG) BY MOUTH TWICE DAILY AS NEEDED FOR ANXIETY   LORazepam (ATIVAN) 0.5 MG tablet Take 1 tablet (0.5 mg total) by mouth 2 (two) times daily as needed for anxiety.   MAGNESIUM PO Take 500 mg by mouth daily.    metoprolol tartrate (LOPRESSOR) 25 MG tablet Take 1 tablet (25 mg total) by  mouth as needed (use as needed for HR greater than 110).   Multiple Vitamins-Minerals (MULTIVITAMIN WITH MINERALS) tablet Take 1 tablet by mouth daily.   pantoprazole (PROTONIX) 40 MG tablet TAKE 1 TABLET(40 MG) BY MOUTH DAILY   Polyethyl Glycol-Propyl Glycol (SYSTANE OP) Place 1 drop into both eyes at bedtime.   Probiotic Product (PROBIOTIC DAILY PO) Take 1 capsule by mouth daily. Digestive Advantage Probiotic   XIFAXAN 550 MG TABS tablet Take 1 tablet (550 mg total) by mouth 2 (two) times daily.     Allergies:   Kiwi extract, Tdap [tetanus-diphth-acell pertussis], Latex, Statins, and Tramadol   Social History   Socioeconomic History   Marital status: Married    Spouse name: Not on file   Number of children: 6   Years of education: Not on file   Highest education level: Not on file  Occupational History   Occupation: retired  Tobacco Use   Smoking status: Never   Smokeless tobacco: Never  Vaping Use   Vaping Use: Never used  Substance and Sexual Activity   Alcohol use: No   Drug use: Never   Sexual activity: Yes    Partners: Male    Birth control/protection: Surgical    Comment: Hysterectomy  Other Topics Concern   Not on file  Social History Narrative   She is retired she is married and lives with her husband   6 children   No alcohol or tobacco never smoker no drug use   Social Determinants of Radio broadcast assistant Strain: Low Risk  (05/29/2017)   Overall  Financial Resource Strain (CARDIA)    Difficulty of Paying Living Expenses: Not hard at all  Food Insecurity: No Food Insecurity (04/19/2019)   Hunger Vital Sign    Worried About Running Out of Food in the Last Year: Never true    [place] in the Last Year: Never true  Transportation Needs: No Transportation Needs (04/19/2019)   PRAPARE - Hydrologist (Medical): No    Lack of Transportation (Non-Medical): No  Physical Activity: Inactive (05/29/2017)   Exercise Vital Sign    Days of Exercise per Week: 0 days    Minutes of Exercise per Session: 0 min  Stress: No Stress Concern Present (05/29/2017)   [place]    Feeling of Stress : Not at all  Social Connections: Moderately Integrated (05/29/2017)   Social Connection and Isolation Panel [NHANES]    Frequency of Communication with Friends and Family: More than three times a week    Frequency of Social Gatherings with Friends and Family: Twice a week    Attends Religious Services: More than 4 times per year    Active Member of [employer] or Organizations: No    Attends Music therapist: Never    Marital Status: Married     Family History: The patient's family history includes Arthritis in her brother and sister; Breast cancer in her maternal aunt, mother, and paternal aunt; Heart disease in her maternal grandfather, maternal grandmother, mother, paternal grandfather, and paternal grandmother; Liver cancer in her brother; Lung cancer in her father; Lymphoma in her brother; Other in her daughter. There is no history of Colon cancer, Esophageal cancer, Rectal cancer, or Stomach cancer.    ROS:   Please see the history of present illness.    All other systems reviewed and are negative.   EKGs/Labs/Other Studies Reviewed:  The following studies were reviewed today:  Echo 08/2019 1. Left ventricular ejection fraction, by  estimation, is 60 to 65%. The  left ventricle has normal function. The left ventricle has no regional  wall motion abnormalities. Left ventricular diastolic parameters are  consistent with Grade II diastolic  dysfunction (pseudonormalization). Elevated left atrial pressure.   2. Right ventricular systolic function is normal. The right ventricular  size is normal. There is normal pulmonary artery systolic pressure. The  estimated right ventricular systolic pressure is 16.0 mmHg.   3. Left atrial size was mildly dilated.   4. The mitral valve is normal in structure. Mild mitral valve  regurgitation.   5. Tricuspid valve regurgitation is mild to moderate.   6. The aortic valve is normal in structure. Aortic valve regurgitation is  trivial.   7. The inferior vena cava is normal in size with greater than 50%  respiratory variability, suggesting right atrial pressure of 3 mmHg.   Comparison(s): Prior images unable to be directly viewed, comparison made  by report only.   EKG:  EKG is not  ordered today.   Recent Labs: 02/17/2022: ALT 34; BUN 11; Creatinine, Ser 0.74; Hemoglobin 14.2; Platelets 103; Potassium 4.2; Sodium 140  Recent Lipid Panel    Component Value Date/Time   CHOL 174 01/04/2022 1434   CHOL 168 12/15/2020 0950   TRIG 91 01/04/2022 1434   HDL 89 01/04/2022 1434   HDL 85 12/15/2020 0950   CHOLHDL 2.0 01/04/2022 1434   VLDL 9 08/20/2019 0307   LDLCALC 68 01/04/2022 1434    Physical Exam:    VS:  BP (!) 90/58   Pulse 63   Ht 5' 3"  (1.6 m)   Wt 128 lb (58.1 kg)   LMP  (LMP Unknown)   SpO2 97%   BMI 22.67 kg/m     Wt Readings from Last 3 Encounters:  02/18/22 128 lb (58.1 kg)  02/17/22 126 lb 9.6 oz (57.4 kg)  02/01/22 126 lb 2 oz (57.2 kg)     GEN:  Well nourished, well developed in no acute distress HEENT: Normal NECK: No JVD; No carotid bruits LYMPHATICS: No lymphadenopathy CARDIAC: RRR, + murmurs, rubs, gallops RESPIRATORY:  Clear to auscultation  without rales, wheezing or rhonchi  ABDOMEN: Soft, non-tender, non-distended MUSCULOSKELETAL:  No edema; No deformity  SKIN: Warm and dry NEUROLOGIC:  Alert and oriented x 3 PSYCHIATRIC:  Normal affect   ASSESSMENT AND PLAN:    1.  Bradycardia Asymptomatic bradycardia in setting of UTI.  Reassuring monitor.  Not on daily rate control agent.  2.  CAD s/p CABG No angina.  Continue statin and beta-blocker.  3.  Heart failure with preserved LV function -Euvolemic.  Continue Lasix and Jardiance.  4.  Back surgery -Patient is scheduled to have back surgery next week.  She is easily getting greater than 4 METS of activity.  She will be cleared at acceptable risk.  Recommended to monitor on telemetry during surgery and postoperatively given recent bradycardia.  5.  Mitral regurgitation -We will update echocardiogram.  6 PAF - Not on anticoagulation due to history of portal vein thrombosis and large vaices with prior bleedin - No afib on recent monitor   Medication Adjustments/Labs and Tests Ordered: Current medicines are reviewed at length with the patient today.  Concerns regarding medicines are outlined above.  Orders Placed This Encounter  Procedures   ECHOCARDIOGRAM COMPLETE   No orders of the defined types were placed in this encounter.  Patient Instructions  Medication Instructions:  Your physician recommends that you continue on your current medications as directed. Please refer to the Current Medication list given to you today. *If you need a refill on your cardiac medications before your next appointment, please call your pharmacy*   Lab Work: None ordered   Testing/Procedures: Your physician has requested that you have an echocardiogram. Echocardiography is a painless test that uses sound waves to create images of your heart. It provides your doctor with information about the size and shape of your heart and how well your heart's chambers and valves are working.  This procedure takes approximately one hour. There are no restrictions for this procedure. Please do NOT wear cologne, perfume, aftershave, or lotions (deodorant is allowed). Please arrive 15 minutes prior to your appointment time.   Follow-Up: At [facility], you and your health needs are our priority.  As part of our continuing mission to provide you with exceptional heart care, we have created designated Provider Care Teams.  These Care Teams include your primary Cardiologist (physician) and Advanced Practice Providers (APPs -  Physician Assistants and Nurse Practitioners) who all work together to provide you with the care you need, when you need it.  We recommend signing up for the patient portal called "MyChart".  Sign up information is provided on this After Visit Summary.  MyChart is used to connect with patients for Virtual Visits (Telemedicine).  Patients are able to view lab/test results, encounter notes, upcoming appointments, etc.  Non-urgent messages can be sent to your provider as well.   To learn more about what you can do with MyChart, go to [email or web address].    Your next appointment:   1 year(s)  The format for your next appointment:   In Person  Provider:   [name], MD     Other Instructions   Important Information About Sugar         Signed, Leanor Kail, [place]  02/18/2022 2:18 PM    [facility]

## 2022-02-18 NOTE — Patient Instructions (Signed)
Medication Instructions:  Your physician recommends that you continue on your current medications as directed. Please refer to the Current Medication list given to you today. *If you need a refill on your cardiac medications before your next appointment, please call your pharmacy*   Lab Work: None ordered   Testing/Procedures: Your physician has requested that you have an echocardiogram. Echocardiography is a painless test that uses sound waves to create images of your heart. It provides your doctor with information about the size and shape of your heart and how well your heart's chambers and valves are working. This procedure takes approximately one hour. There are no restrictions for this procedure. Please do NOT wear cologne, perfume, aftershave, or lotions (deodorant is allowed). Please arrive 15 minutes prior to your appointment time.   Follow-Up: At [facility], you and your health needs are our priority.  As part of our continuing mission to provide you with exceptional heart care, we have created designated Provider Care Teams.  These Care Teams include your primary Cardiologist (physician) and Advanced Practice Providers (APPs -  Physician Assistants and Nurse Practitioners) who all work together to provide you with the care you need, when you need it.  We recommend signing up for the patient portal called "MyChart".  Sign up information is provided on this After Visit Summary.  MyChart is used to connect with patients for Virtual Visits (Telemedicine).  Patients are able to view lab/test results, encounter notes, upcoming appointments, etc.  Non-urgent messages can be sent to your provider as well.   To learn more about what you can do with MyChart, go to [email or web address].    Your next appointment:   1 year(s)  The format for your next appointment:   In Person  Provider:   [name], MD     Other Instructions   Important Information About Sugar

## 2022-02-25 ENCOUNTER — Other Ambulatory Visit: Payer: Self-pay

## 2022-02-25 ENCOUNTER — Ambulatory Visit (HOSPITAL_COMMUNITY): Payer: Medicare Other

## 2022-02-25 ENCOUNTER — Ambulatory Visit (HOSPITAL_COMMUNITY): Payer: Medicare Other | Admitting: Physician Assistant

## 2022-02-25 ENCOUNTER — Encounter (HOSPITAL_COMMUNITY)
Admission: RE | Disposition: A | Discharge status: Home / Self Care | Payer: Self-pay | Source: Home / Self Care | Attending: Neurological Surgery

## 2022-02-25 ENCOUNTER — Observation Stay (HOSPITAL_COMMUNITY)
Admission: RE | Admit: 2022-02-25 | Discharge: 2022-02-25 | Disposition: A | Discharge status: Home / Self Care | Payer: Medicare Other | Attending: Neurological Surgery | Admitting: Neurological Surgery

## 2022-02-25 ENCOUNTER — Encounter (HOSPITAL_COMMUNITY): Payer: Self-pay | Admitting: Neurological Surgery

## 2022-02-25 ENCOUNTER — Ambulatory Visit (HOSPITAL_BASED_OUTPATIENT_CLINIC_OR_DEPARTMENT_OTHER): Payer: Medicare Other | Admitting: Anesthesiology

## 2022-02-25 DIAGNOSIS — I252 Old myocardial infarction: Secondary | ICD-10-CM | POA: Diagnosis not present

## 2022-02-25 DIAGNOSIS — M48061 Spinal stenosis, lumbar region without neurogenic claudication: Secondary | ICD-10-CM

## 2022-02-25 DIAGNOSIS — R7309 Other abnormal glucose: Secondary | ICD-10-CM | POA: Diagnosis not present

## 2022-02-25 DIAGNOSIS — M4316 Spondylolisthesis, lumbar region: Principal | ICD-10-CM | POA: Insufficient documentation

## 2022-02-25 DIAGNOSIS — I2510 Atherosclerotic heart disease of native coronary artery without angina pectoris: Secondary | ICD-10-CM | POA: Diagnosis not present

## 2022-02-25 DIAGNOSIS — I4891 Unspecified atrial fibrillation: Secondary | ICD-10-CM | POA: Diagnosis not present

## 2022-02-25 DIAGNOSIS — Z01818 Encounter for other preprocedural examination: Secondary | ICD-10-CM

## 2022-02-25 DIAGNOSIS — I509 Heart failure, unspecified: Secondary | ICD-10-CM | POA: Diagnosis not present

## 2022-02-25 DIAGNOSIS — I110 Hypertensive heart disease with heart failure: Secondary | ICD-10-CM | POA: Diagnosis not present

## 2022-02-25 DIAGNOSIS — Z981 Arthrodesis status: Secondary | ICD-10-CM | POA: Diagnosis not present

## 2022-02-25 DIAGNOSIS — M1990 Unspecified osteoarthritis, unspecified site: Secondary | ICD-10-CM

## 2022-02-25 HISTORY — PX: LAMINECTOMY WITH POSTERIOR LATERAL ARTHRODESIS LEVEL 1: SHX6335

## 2022-02-25 LAB — GLUCOSE, CAPILLARY
Glucose-Capillary: 115 mg/dL — ABNORMAL HIGH (ref 70–99)
Glucose-Capillary: 109 mg/dL — ABNORMAL HIGH (ref 70–99)
Glucose-Capillary: 309 mg/dL — ABNORMAL HIGH (ref 70–99)

## 2022-02-25 SURGERY — LAMINECTOMY WITH POSTERIOR LATERAL ARTHRODESIS LEVEL 1
Anesthesia: General | Site: Back | Laterality: Bilateral

## 2022-02-25 MED ORDER — SODIUM CHLORIDE 0.9% FLUSH: 3.0000 mL | INTRAVENOUS | Status: DC | PRN

## 2022-02-25 MED ORDER — SODIUM CHLORIDE 0.9% FLUSH
3.0000 mL | Freq: Two times a day (BID) | INTRAVENOUS | Status: DC
  Administered 2022-02-25: 3 mL via INTRAVENOUS

## 2022-02-25 MED ORDER — FENTANYL CITRATE (PF) 100 MCG/2ML IJ SOLN
INTRAMUSCULAR | Status: AC
  Filled 2022-02-25: qty 2

## 2022-02-25 MED ORDER — METHOCARBAMOL 1000 MG/10ML IJ SOLN
500.0000 mg | Freq: Four times a day (QID) | INTRAVENOUS | Status: DC | PRN
  Filled 2022-02-25: qty 5

## 2022-02-25 MED ORDER — CELECOXIB 200 MG PO CAPS
200.0000 mg | ORAL_CAPSULE | Freq: Two times a day (BID) | ORAL | Status: DC
  Administered 2022-02-25: 200 mg via ORAL
  Filled 2022-02-25: qty 1

## 2022-02-25 MED ORDER — LIDOCAINE 2% (20 MG/ML) 5 ML SYRINGE
INTRAMUSCULAR | Status: AC
  Filled 2022-02-25: qty 5

## 2022-02-25 MED ORDER — FENTANYL CITRATE (PF) 100 MCG/2ML IJ SOLN
25.0000 ug | INTRAMUSCULAR | Status: DC | PRN
  Administered 2022-02-25 (×2): 25 ug via INTRAVENOUS

## 2022-02-25 MED ORDER — BIOTIN 10000 MCG PO TABS: 10000.0000 ug | ORAL_TABLET | Freq: Every day | ORAL | Status: DC

## 2022-02-25 MED ORDER — POTASSIUM CHLORIDE IN NACL 20-0.9 MEQ/L-% IV SOLN: INTRAVENOUS | Status: DC

## 2022-02-25 MED ORDER — ADULT MULTIVITAMIN W/MINERALS CH
1.0000 | ORAL_TABLET | Freq: Every day | ORAL | Status: DC
  Administered 2022-02-25: 1 via ORAL
  Filled 2022-02-25: qty 1

## 2022-02-25 MED ORDER — ONDANSETRON HCL 4 MG PO TABS: 4.0000 mg | ORAL_TABLET | Freq: Four times a day (QID) | ORAL | Status: DC | PRN

## 2022-02-25 MED ORDER — DEXAMETHASONE SODIUM PHOSPHATE 10 MG/ML IJ SOLN
INTRAMUSCULAR | Status: AC
  Filled 2022-02-25: qty 1

## 2022-02-25 MED ORDER — ROCURONIUM BROMIDE 10 MG/ML (PF) SYRINGE
PREFILLED_SYRINGE | INTRAVENOUS | Status: DC | PRN
  Administered 2022-02-25: 50 mg via INTRAVENOUS
  Administered 2022-02-25: 20 mg via INTRAVENOUS

## 2022-02-25 MED ORDER — RIFAXIMIN 550 MG PO TABS
550.0000 mg | ORAL_TABLET | Freq: Two times a day (BID) | ORAL | Status: DC
  Filled 2022-02-25: qty 1

## 2022-02-25 MED ORDER — PHENYLEPHRINE HCL-NACL 20-0.9 MG/250ML-% IV SOLN
INTRAVENOUS | Status: DC | PRN
  Administered 2022-02-25: 25 ug/min via INTRAVENOUS

## 2022-02-25 MED ORDER — FUROSEMIDE 20 MG PO TABS
10.0000 mg | ORAL_TABLET | Freq: Every day | ORAL | Status: DC
  Filled 2022-02-25: qty 1

## 2022-02-25 MED ORDER — ONDANSETRON HCL 4 MG/2ML IJ SOLN: 4.0000 mg | Freq: Four times a day (QID) | INTRAMUSCULAR | Status: DC | PRN

## 2022-02-25 MED ORDER — CEFAZOLIN SODIUM-DEXTROSE 2-3 GM-%(50ML) IV SOLR
INTRAVENOUS | Status: DC | PRN
  Administered 2022-02-25: 2 g via INTRAVENOUS

## 2022-02-25 MED ORDER — SENNA 8.6 MG PO TABS
1.0000 | ORAL_TABLET | Freq: Two times a day (BID) | ORAL | Status: DC
  Administered 2022-02-25: 8.6 mg via ORAL
  Filled 2022-02-25: qty 1

## 2022-02-25 MED ORDER — THROMBIN 5000 UNITS EX SOLR
CUTANEOUS | Status: AC
  Filled 2022-02-25: qty 5000

## 2022-02-25 MED ORDER — CHLORHEXIDINE GLUCONATE CLOTH 2 % EX PADS: 6.0000 | MEDICATED_PAD | Freq: Once | CUTANEOUS | Status: DC

## 2022-02-25 MED ORDER — PHENYLEPHRINE 80 MCG/ML (10ML) SYRINGE FOR IV PUSH (FOR BLOOD PRESSURE SUPPORT)
PREFILLED_SYRINGE | INTRAVENOUS | Status: DC | PRN
  Administered 2022-02-25: 160 ug via INTRAVENOUS

## 2022-02-25 MED ORDER — VANCOMYCIN HCL 1000 MG IV SOLR
INTRAVENOUS | Status: AC
  Filled 2022-02-25: qty 20

## 2022-02-25 MED ORDER — GABAPENTIN 300 MG PO CAPS
300.0000 mg | ORAL_CAPSULE | ORAL | Status: AC
  Administered 2022-02-25: 300 mg via ORAL
  Filled 2022-02-25: qty 1

## 2022-02-25 MED ORDER — ACETAMINOPHEN 500 MG PO TABS
1000.0000 mg | ORAL_TABLET | ORAL | Status: DC
  Filled 2022-02-25: qty 2

## 2022-02-25 MED ORDER — INSULIN ASPART 100 UNIT/ML IJ SOLN
0.0000 [IU] | Freq: Three times a day (TID) | INTRAMUSCULAR | Status: DC
  Administered 2022-02-25: 11 [IU] via SUBCUTANEOUS

## 2022-02-25 MED ORDER — PROPOFOL 10 MG/ML IV BOLUS
INTRAVENOUS | Status: AC
  Filled 2022-02-25: qty 20

## 2022-02-25 MED ORDER — ONDANSETRON HCL 4 MG/2ML IJ SOLN
INTRAMUSCULAR | Status: AC
  Filled 2022-02-25: qty 2

## 2022-02-25 MED ORDER — LACTATED RINGERS IV SOLN: INTRAVENOUS | Status: DC

## 2022-02-25 MED ORDER — FENTANYL CITRATE (PF) 250 MCG/5ML IJ SOLN
INTRAMUSCULAR | Status: DC | PRN
  Administered 2022-02-25: 100 ug via INTRAVENOUS
  Administered 2022-02-25: 50 ug via INTRAVENOUS

## 2022-02-25 MED ORDER — SODIUM CHLORIDE 0.9 % IV SOLN
250.0000 mL | INTRAVENOUS | Status: DC
  Administered 2022-02-25: 250 mL via INTRAVENOUS

## 2022-02-25 MED ORDER — 0.9 % SODIUM CHLORIDE (POUR BTL) OPTIME
TOPICAL | Status: DC | PRN
  Administered 2022-02-25: 1000 mL

## 2022-02-25 MED ORDER — CEFAZOLIN SODIUM-DEXTROSE 2-4 GM/100ML-% IV SOLN
2.0000 g | Freq: Three times a day (TID) | INTRAVENOUS | Status: DC
  Administered 2022-02-25: 2 g via INTRAVENOUS
  Filled 2022-02-25: qty 100

## 2022-02-25 MED ORDER — EMPAGLIFLOZIN 25 MG PO TABS
25.0000 mg | ORAL_TABLET | Freq: Every day | ORAL | Status: DC
  Administered 2022-02-25: 25 mg via ORAL
  Filled 2022-02-25: qty 1

## 2022-02-25 MED ORDER — OXYCODONE HCL 5 MG PO TABS
5.0000 mg | ORAL_TABLET | ORAL | Status: DC | PRN
  Administered 2022-02-25: 5 mg via ORAL
  Filled 2022-02-25: qty 1

## 2022-02-25 MED ORDER — VITAMIN B-12 1000 MCG PO TABS
1000.0000 ug | ORAL_TABLET | Freq: Every day | ORAL | Status: DC
  Administered 2022-02-25: 1000 ug via ORAL
  Filled 2022-02-25: qty 1

## 2022-02-25 MED ORDER — ORAL CARE MOUTH RINSE: 15.0000 mL | Freq: Once | OROMUCOSAL | Status: AC

## 2022-02-25 MED ORDER — PROPOFOL 10 MG/ML IV BOLUS
INTRAVENOUS | Status: DC | PRN
  Administered 2022-02-25: 100 mg via INTRAVENOUS

## 2022-02-25 MED ORDER — SUGAMMADEX SODIUM 200 MG/2ML IV SOLN
INTRAVENOUS | Status: DC | PRN
  Administered 2022-02-25: 200 mg via INTRAVENOUS

## 2022-02-25 MED ORDER — BUPIVACAINE HCL (PF) 0.25 % IJ SOLN
INTRAMUSCULAR | Status: AC
  Filled 2022-02-25: qty 30

## 2022-02-25 MED ORDER — ONDANSETRON HCL 4 MG/2ML IJ SOLN
INTRAMUSCULAR | Status: DC | PRN
  Administered 2022-02-25: 4 mg via INTRAVENOUS

## 2022-02-25 MED ORDER — THROMBIN 20000 UNITS EX SOLR
CUTANEOUS | Status: DC | PRN
  Administered 2022-02-25: 20 mL via TOPICAL

## 2022-02-25 MED ORDER — FENTANYL CITRATE (PF) 250 MCG/5ML IJ SOLN
INTRAMUSCULAR | Status: AC
  Filled 2022-02-25: qty 5

## 2022-02-25 MED ORDER — THROMBIN 20000 UNITS EX SOLR
CUTANEOUS | Status: AC
  Filled 2022-02-25: qty 20000

## 2022-02-25 MED ORDER — LIDOCAINE 2% (20 MG/ML) 5 ML SYRINGE
INTRAMUSCULAR | Status: DC | PRN
  Administered 2022-02-25: 40 mg via INTRAVENOUS

## 2022-02-25 MED ORDER — EZETIMIBE 10 MG PO TABS: 10.0000 mg | ORAL_TABLET | Freq: Every day | ORAL | Status: DC

## 2022-02-25 MED ORDER — PANTOPRAZOLE SODIUM 40 MG PO TBEC
40.0000 mg | DELAYED_RELEASE_TABLET | Freq: Every day | ORAL | Status: DC
  Administered 2022-02-25: 40 mg via ORAL
  Filled 2022-02-25: qty 1

## 2022-02-25 MED ORDER — METOPROLOL TARTRATE 25 MG PO TABS: 25.0000 mg | ORAL_TABLET | Freq: Two times a day (BID) | ORAL | Status: DC | PRN

## 2022-02-25 MED ORDER — LACTULOSE 10 GM/15ML PO SOLN: 20.0000 g | ORAL | Status: DC

## 2022-02-25 MED ORDER — THROMBIN 5000 UNITS EX SOLR
OROMUCOSAL | Status: DC | PRN
  Administered 2022-02-25: 5 mL via TOPICAL

## 2022-02-25 MED ORDER — ACETAMINOPHEN 500 MG PO TABS
1000.0000 mg | ORAL_TABLET | Freq: Four times a day (QID) | ORAL | Status: DC
  Administered 2022-02-25: 1000 mg via ORAL
  Filled 2022-02-25: qty 2

## 2022-02-25 MED ORDER — PHENOL 1.4 % MT LIQD: 1.0000 | OROMUCOSAL | Status: DC | PRN

## 2022-02-25 MED ORDER — METHOCARBAMOL 500 MG PO TABS: 500.0000 mg | ORAL_TABLET | Freq: Four times a day (QID) | ORAL | Status: DC | PRN

## 2022-02-25 MED ORDER — ROCURONIUM BROMIDE 10 MG/ML (PF) SYRINGE
PREFILLED_SYRINGE | INTRAVENOUS | Status: AC
  Filled 2022-02-25: qty 10

## 2022-02-25 MED ORDER — INSULIN ASPART 100 UNIT/ML IJ SOLN: 0.0000 [IU] | INTRAMUSCULAR | Status: DC | PRN

## 2022-02-25 MED ORDER — ACETAMINOPHEN 10 MG/ML IV SOLN
INTRAVENOUS | Status: AC
  Filled 2022-02-25: qty 100

## 2022-02-25 MED ORDER — MENTHOL 3 MG MT LOZG: 1.0000 | LOZENGE | OROMUCOSAL | Status: DC | PRN

## 2022-02-25 MED ORDER — MORPHINE SULFATE (PF) 2 MG/ML IV SOLN: 2.0000 mg | INTRAVENOUS | Status: DC | PRN

## 2022-02-25 MED ORDER — ACETAMINOPHEN 10 MG/ML IV SOLN
1000.0000 mg | Freq: Once | INTRAVENOUS | Status: DC | PRN
  Administered 2022-02-25: 1000 mg via INTRAVENOUS

## 2022-02-25 MED ORDER — CEFAZOLIN SODIUM-DEXTROSE 2-4 GM/100ML-% IV SOLN
2.0000 g | INTRAVENOUS | Status: DC
  Filled 2022-02-25: qty 100

## 2022-02-25 MED ORDER — CHLORHEXIDINE GLUCONATE 0.12 % MT SOLN
15.0000 mL | Freq: Once | OROMUCOSAL | Status: AC
  Administered 2022-02-25: 15 mL via OROMUCOSAL
  Filled 2022-02-25: qty 15

## 2022-02-25 MED ORDER — MAGNESIUM OXIDE -MG SUPPLEMENT 400 (240 MG) MG PO TABS
400.0000 mg | ORAL_TABLET | Freq: Every day | ORAL | Status: DC
  Administered 2022-02-25: 400 mg via ORAL
  Filled 2022-02-25: qty 1

## 2022-02-25 MED ORDER — BUPIVACAINE HCL (PF) 0.25 % IJ SOLN
INTRAMUSCULAR | Status: DC | PRN
  Administered 2022-02-25: 9 mL

## 2022-02-25 SURGICAL SUPPLY — 60 items
BAG COUNTER SPONGE SURGICOUNT (BAG) ×1 IMPLANT
BASKET BONE COLLECTION (BASKET) IMPLANT
BENZOIN TINCTURE PRP APPL 2/3 (GAUZE/BANDAGES/DRESSINGS) ×1 IMPLANT
BIT DRILL PLIF MAS DISP 5.5MM (DRILL) IMPLANT
BLADE BONE MILL MEDIUM (MISCELLANEOUS) IMPLANT
BLADE CLIPPER SURG (BLADE) IMPLANT
BONE CANC CHIPS 40CC CAN1/2 (Bone Implant) ×1 IMPLANT
BUR CARBIDE MATCH 3.0 (BURR) ×1 IMPLANT
CANISTER SUCT 3000ML PPV (MISCELLANEOUS) ×1 IMPLANT
CHIPS CANC BONE 40CC CAN1/2 (Bone Implant) ×1 IMPLANT
CNTNR URN SCR LID CUP LEK RST (MISCELLANEOUS) ×1 IMPLANT
CONT SPEC 4OZ STRL OR WHT (MISCELLANEOUS) ×1
COVER BACK TABLE 60X90IN (DRAPES) ×1 IMPLANT
DRAPE C-ARM 42X72 X-RAY (DRAPES) IMPLANT
DRAPE LAPAROTOMY 100X72X124 (DRAPES) ×1 IMPLANT
DRAPE SURG 17X23 STRL (DRAPES) ×1 IMPLANT
DRILL PLIF MAS DISP 5.5MM (DRILL) ×1
DRSG OPSITE POSTOP 4X6 (GAUZE/BANDAGES/DRESSINGS) IMPLANT
DURAPREP 26ML APPLICATOR (WOUND CARE) ×1 IMPLANT
ELECT REM PT RETURN 9FT ADLT (ELECTROSURGICAL) ×1
ELECTRODE REM PT RTRN 9FT ADLT (ELECTROSURGICAL) ×1 IMPLANT
EVACUATOR 1/8 PVC DRAIN (DRAIN) IMPLANT
GAUZE 4X4 16PLY ~~LOC~~+RFID DBL (SPONGE) IMPLANT
GLOVE BIO SURGEON STRL SZ7 (GLOVE) IMPLANT
GLOVE BIO SURGEON STRL SZ8 (GLOVE) ×2 IMPLANT
GLOVE BIOGEL PI IND STRL 7.0 (GLOVE) IMPLANT
GLOVE BIOGEL PI IND STRL 7.5 (GLOVE) IMPLANT
GLOVE SURG SS PI 6.5 STRL IVOR (GLOVE) IMPLANT
GLOVE SURG SS PI 7.0 STRL IVOR (GLOVE) IMPLANT
GOWN STRL REUS W/ TWL LRG LVL3 (GOWN DISPOSABLE) IMPLANT
GOWN STRL REUS W/ TWL XL LVL3 (GOWN DISPOSABLE) ×2 IMPLANT
GOWN STRL REUS W/TWL 2XL LVL3 (GOWN DISPOSABLE) IMPLANT
GOWN STRL REUS W/TWL LRG LVL3 (GOWN DISPOSABLE) ×2
GOWN STRL REUS W/TWL XL LVL3 (GOWN DISPOSABLE) ×4
GRAFT BNE CHIP CANC 1-8 40 (Bone Implant) IMPLANT
GRAFT BONE PROTEIOS XL 10CC (Orthopedic Implant) IMPLANT
HEMOSTAT POWDER KIT SURGIFOAM (HEMOSTASIS) IMPLANT
KIT BASIN OR (CUSTOM PROCEDURE TRAY) ×1 IMPLANT
KIT TURNOVER KIT B (KITS) ×1 IMPLANT
NDL HYPO 25X1 1.5 SAFETY (NEEDLE) ×1 IMPLANT
NEEDLE HYPO 25X1 1.5 SAFETY (NEEDLE) ×1 IMPLANT
NS IRRIG 1000ML POUR BTL (IV SOLUTION) ×1 IMPLANT
PACK LAMINECTOMY NEURO (CUSTOM PROCEDURE TRAY) ×1 IMPLANT
PAD ARMBOARD 7.5X6 YLW CONV (MISCELLANEOUS) ×3 IMPLANT
ROD 5.5X40MM (Rod) IMPLANT
SCREW LOCK (Screw) ×4 IMPLANT
SCREW LOCK FXNS SPNE MAS PL (Screw) IMPLANT
SCREW SHANK 6.5X45 (Screw) IMPLANT
SCREW TULIP 5.5 (Screw) IMPLANT
SPONGE SURGIFOAM ABS GEL 100 (HEMOSTASIS) ×1 IMPLANT
SPONGE T-LAP 4X18 ~~LOC~~+RFID (SPONGE) IMPLANT
STRIP CLOSURE SKIN 1/2X4 (GAUZE/BANDAGES/DRESSINGS) ×2 IMPLANT
SUT VIC AB 0 CT1 18XCR BRD8 (SUTURE) ×1 IMPLANT
SUT VIC AB 0 CT1 8-18 (SUTURE) ×1
SUT VIC AB 2-0 CP2 18 (SUTURE) ×1 IMPLANT
SUT VIC AB 3-0 SH 8-18 (SUTURE) ×2 IMPLANT
TOWEL GREEN STERILE (TOWEL DISPOSABLE) ×1 IMPLANT
TOWEL GREEN STERILE FF (TOWEL DISPOSABLE) ×1 IMPLANT
TRAY FOLEY MTR SLVR 16FR STAT (SET/KITS/TRAYS/PACK) IMPLANT
WATER STERILE IRR 1000ML POUR (IV SOLUTION) ×1 IMPLANT

## 2022-02-25 NOTE — Op Note (Signed)
02/25/2022  12:20 PM  PATIENT:  [name]  76 y.o. female  PRE-OPERATIVE DIAGNOSIS: Adjacent level spondylolisthesis with spinal stenosis L4-5 with back and leg pain  POST-OPERATIVE DIAGNOSIS:  same  PROCEDURE:   1. Decompressive lumbar hemilaminectomy, medial facetectomy and foraminotomies L4-5 bilaterally 2. Posterior fixation L4-5 using NuVasive cortical pedicle screws.  3. Intertransverse arthrodesis L4-5 using morcellized autograft and allograft. 4.  Removal of nonsegmental fixation L3-4 with exploration of fusion L3-4 to confirm arthrodesis  SURGEON:  Sherley Bounds, MD  ASSISTANTS: Dr. Kathyrn Sheriff  ANESTHESIA:  General  EBL: 150 ml  Total I/O In: 91 [IV Piggyback:50] Out: 250 [Urine:100; Blood:150]  BLOOD ADMINISTERED:none  DRAINS: none   INDICATION FOR PROCEDURE: This patient presented with back pain with bilateral leg pain with walking. Imaging revealed adjacent level stenosis at L4-5 with a grade 1 spondylolisthesis below a previous successful L3-4 fusion. The patient tried a reasonable attempt at conservative medical measures without relief. I recommended decompression and instrumented fusion to address the stenosis as well as the segmental  instability.  Patient understood the risks, benefits, and alternatives and potential outcomes and wished to proceed.  PROCEDURE DETAILS:  The patient was brought to the operating room. After induction of generalized endotracheal anesthesia the patient was rolled into the prone position on chest rolls and all pressure points were padded. The patient's lumbar region was cleaned and then prepped with DuraPrep and draped in the usual sterile fashion. Anesthesia was injected and then a dorsal midline incision was made and carried down to the lumbosacral fascia. The fascia was opened and the paraspinous musculature was taken down in a subperiosteal fashion to expose the previously placed instrumentation as well as L4-5.  The  locking caps were removed from the previous mentation and the rods were removed.  All 4 screws had excellent purchase.  We explored the fusion and found her to be solid.   I then turned my attention to the decompression and complete lumbar hemilaminectomies, medial- facetectomies, and foraminotomies were performed at L4-5.  My nurse practitioner was directly involved in the decompression and exposure of the neural elements.  A generous decompression and generous foraminotomy was undertaken in order to adequately decompress the neural elements and address the patient's leg pain. The yellow ligament was removed to expose the underlying dura and nerve roots, and generous foraminotomies were performed to adequately decompress the neural elements.  a coronary dilator passed easily along the nerve roots.   We then turned our attention to the placement of the lower pedicle screws. The pedicle screw entry zones were identified utilizing surface landmarks and fluoroscopy. I drilled into each pedicle utilizing the hand drill, and tapped each pedicle with the appropriate tap. We palpated with a ball probe to assure no break in the cortex. We then placed 6.5 x 45 mm cortical pedicle screws into the pedicles bilaterally at L5.  My nurse practitioner assisted in placement of the pedicle screws.  We then decorticated the transverse processes and laid a mixture of morcellized autograft and allograft out over these to perform intertransverse arthrodesis at L4-5. We then placed lordotic rods into the multiaxial screw heads of the pedicle screws of L4 and L5 and locked these in position with the locking caps and anti-torque device. We then checked our construct with AP and lateral fluoroscopy. Irrigated with copious amounts of bacitracin-containing saline solution. Inspected the nerve roots once again to assure adequate decompression, lined to the dura with Gelfoam,  and then we closed the  muscle and the fascia with 0 Vicryl.  Closed the subcutaneous tissues with 2-0 Vicryl and subcuticular tissues with 3-0 Vicryl. The skin was closed with benzoin and Steri-Strips. Dressing was then applied, the patient was awakened from general anesthesia and transported to the recovery room in stable condition. At the end of the procedure all sponge, needle and instrument counts were correct.   PLAN OF CARE: admit to inpatient  PATIENT DISPOSITION:  PACU - hemodynamically stable.   Delay start of Pharmacological VTE agent (>24hrs) due to surgical blood loss or risk of bleeding:  yes

## 2022-02-25 NOTE — Progress Notes (Signed)
PT Cancellation Note  Patient Details Name: [name] MRN: [id number] DOB: 1946-01-20   Cancelled Treatment:    Reason Eval/Treat Not Completed: PT screened, no needs identified, will sign off. See OT Evaluation note for further details.  Mabeline Caras, PT, DPT Acute Rehabilitation Services  Personal: [facility] Office: [place] 02/25/2022, 6:09 PM

## 2022-02-25 NOTE — Anesthesia Procedure Notes (Signed)
Procedure Name: Intubation Date/Time: 02/25/2022 9:56 AM  Performed by: Rande Brunt, CRNAPre-anesthesia Checklist: Patient identified, Emergency Drugs available, Suction available and Patient being monitored Patient Re-evaluated:Patient Re-evaluated prior to induction Oxygen Delivery Method: Circle System Utilized Preoxygenation: Pre-oxygenation with 100% oxygen Induction Type: IV induction Ventilation: Mask ventilation without difficulty Laryngoscope Size: Miller and 2 Grade View: Grade I Tube type: Oral Tube size: 7.0 mm Number of attempts: 1 Airway Equipment and Method: Stylet Placement Confirmation: ETT inserted through vocal cords under direct vision, positive ETCO2 and breath sounds checked- equal and bilateral Secured at: 22 cm Tube secured with: Tape Dental Injury: Teeth and Oropharynx as per pre-operative assessment

## 2022-02-25 NOTE — Transfer of Care (Signed)
Immediate Anesthesia Transfer of Care Note  Patient: [name]  Procedure(s) Performed: Laminectomy and Foraminotomy - Lumbar four-Lumbar five - bilateral, posterolateral arthrodesis Lumbar four-five, extension of hardware Lumbar four-five (Bilateral: Back)  Patient Location: PACU  Anesthesia Type:General  Level of Consciousness: awake, drowsy and patient cooperative  Airway & Oxygen Therapy: Patient Spontanous Breathing  Post-op Assessment: Report given to RN, Post -op Vital signs reviewed and stable and Patient moving all extremities X 4  Post vital signs: Reviewed and stable  Last Vitals:  Vitals Value Taken Time  BP 141/54 02/25/22 1230  Temp    Pulse 76 02/25/22 1233  Resp 17 02/25/22 1233  SpO2 94 % 02/25/22 1233  Vitals shown include unvalidated device data.  Last Pain:  Vitals:   02/25/22 0926  TempSrc:   PainSc: 0-No pain         Complications: No notable events documented.

## 2022-02-25 NOTE — Discharge Summary (Signed)
Physician Discharge Summary  Patient ID: [name] MRN: [id number] DOB/AGE: [birth year] 75 y.o.  Admit date: 02/25/2022 Discharge date: 02/25/2022  Admission Diagnoses:  Adjacent level spondylolisthesis with spinal stenosis L4-5 with back and leg pain   Discharge Diagnoses: same   Discharged Condition: good  Hospital Course: The patient was admitted on 02/25/2022 and taken to the operating room where the patient underwent extension of fusion to L4-5 with posterior lateral athrodesis. The patient tolerated the procedure well and was taken to the recovery room and then to the floor in stable condition. The hospital course was routine. There were no complications. The wound remained clean dry and intact. Pt had appropriate back soreness. No complaints of leg pain or new N/T/W. The patient remained afebrile with stable vital signs, and tolerated a regular diet. The patient continued to increase activities, and pain was well controlled with oral pain medications.   Consults: None  Significant Diagnostic Studies:  Results for orders placed or performed during the hospital encounter of 02/25/22  Glucose, capillary  Result Value Ref Range   Glucose-Capillary 115 (H) 70 - 99 mg/dL   Comment 1 Notify RN    Comment 2 Document in Chart   Glucose, capillary  Result Value Ref Range   Glucose-Capillary 109 (H) 70 - 99 mg/dL    DG Lumbar Spine 2-3 Views  Result Date: 02/25/2022 CLINICAL DATA:  Elective surgery. L4-L5 laminectomy and foraminotomy, posterior-lateral arthrodesis, extension of hardware. EXAM: LUMBAR SPINE - 2-3 VIEW COMPARISON:  Radiograph 12/07/2021 FINDINGS: Two fluoroscopic spot views of the lumbar spine obtained in the operating room in frontal and lateral projections. New posterior rod and intrapedicular screw fusion at L5. The intrapedicular screws at L3 and L4 are again seen. L3-L4 interbody spacer in place. Fluoroscopy time 36.6 seconds. Dose 18.28 mGy. IMPRESSION:  Intraoperative fluoroscopy during lumbar fusion. Electronically Signed   By: Keith Rake M.D.   On: 02/25/2022 13:21   DG C-Arm 1-60 Min-No Report  Result Date: 02/25/2022 Fluoroscopy was utilized by the requesting physician.  No radiographic interpretation.   DG C-Arm 1-60 Min-No Report  Result Date: 02/25/2022 Fluoroscopy was utilized by the requesting physician.  No radiographic interpretation.   LONG TERM MONITOR (3-14 DAYS)  Result Date: 02/11/2022 Patch Wear Time:  2 days and 17 hours (2023-09-23T15:03:34-0400 to 2023-09-26T08:08:25-0400) Patient had a min HR of 54 bpm, max HR of 148 bpm, and avg HR of 69 bpm. Predominant underlying rhythm was Sinus Rhythm. Bundle Branch Block/IVCD was present. 5 Supraventricular Tachycardia runs occurred, the run with the fastest interval lasting 6 beats  with a max rate of 148 bpm, the longest lasting 8 beats with an avg rate of 119 bpm. Isolated SVEs were rare (<1.0%), SVE Couplets were rare (<1.0%), and SVE Triplets were rare (<1.0%). Isolated VEs were rare (<1.0%), and no VE Couplets or VE Triplets were present. Inverted QRS complexes possibly due to inverted placement of device. [name] MD [facility]   US Abdomen Limited RUQ (LIVER/GB)  Result Date: 02/03/2022 CLINICAL DATA:  Cirrhosis. EXAM: ULTRASOUND ABDOMEN LIMITED RIGHT UPPER QUADRANT COMPARISON:  CT of the abdomen and pelvis May 29, 2021. MRI of the abdomen and pelvis June 15, 2021. FINDINGS: Gallbladder: The gallbladder wall is diffusely thickened measuring 5.1 mm. The gallbladder is otherwise normal. Common bile duct: Diameter: 3.6 mm Liver: Coarsened echogenicity. Mildly nodular contour. No other abnormalities. The portal vein is patent. Other: None. IMPRESSION: 1. The gallbladder wall is diffusely thickened consistent with history of cirrhosis. 2.  Cirrhotic liver with coarsened echotexture and a mildly nodular contour. No liver mass identified. Electronically Signed   By: Dorise Bullion III M.D.   On: 02/03/2022 14:09    Antibiotics:  Anti-infectives (From admission, onward)    Start     Dose/Rate Route Frequency Ordered Stop   02/25/22 2200  rifaximin (XIFAXAN) tablet 550 mg        550 mg Oral 2 times daily 02/25/22 1400     02/25/22 1330  ceFAZolin (ANCEF) IVPB 2g/100 mL premix        2 g 200 mL/hr over 30 Minutes Intravenous Every 8 hours 02/25/22 1326 02/26/22 0529   02/25/22 0845  ceFAZolin (ANCEF) IVPB 2g/100 mL premix  Status:  Discontinued        2 g 200 mL/hr over 30 Minutes Intravenous On call to O.R. 02/25/22 0844 02/25/22 1330       Discharge Exam: Blood pressure (!) 121/50, pulse 76, temperature 98.3 F (36.8 C), temperature source Oral, resp. rate 16, height 5' 3"  (1.6 m), weight 57.2 kg, SpO2 99 %. Neurologic: Grossly normal Ambulating and voiding well incision cdi   Discharge Medications:   Allergies as of 02/25/2022       Reactions   Kiwi Extract Anaphylaxis   Tdap [tetanus-diphth-acell Pertussis] Swelling, Other (See Comments)   Swelling at injection site, gets very hot   Latex Itching, Dermatitis, Rash   Statins Other (See Comments)   RHABDOMYOLYSIS   Tramadol Nausea And Vomiting        Medication List     TAKE these medications    acetaminophen 650 MG CR tablet Commonly known as: TYLENOL Take 1,300 mg by mouth every 8 (eight) hours as needed for pain.   B-D ULTRAFINE III SHORT PEN 31G X 8 MM Misc Generic drug: Insulin Pen Needle Use to give Insulin injections daily. Dx: E11.65   Biotin 10000 MCG Tabs Take 10,000 mcg by mouth daily.   CALCIUM 600 + D PO Take 1 tablet by mouth in the morning and at bedtime.   empagliflozin 25 MG Tabs tablet Commonly known as: Jardiance Take 1 tablet (25 mg total) by mouth daily.   ezetimibe 10 MG tablet Commonly known as: ZETIA Take 1 tablet (10 mg total) by mouth daily. What changed: how much to take   fluticasone 50 MCG/ACT nasal spray Commonly known as: FLONASE Place  1 spray into both nostrils daily as needed for allergies or rhinitis.   furosemide 20 MG tablet Commonly known as: LASIX TAKE 1 TABLET(20 MG) BY MOUTH DAILY   glucose blood test strip One Touch Ultra Blue test strips. Use to test blood sugar three times daily. Dx: E11.65   INSULIN SYRINGE 1CC/31GX5/16" 31G X 5/16" 1 ML Misc USE AS DIRECTED TO INJECT LEVIMIR   lactulose 10 GM/15ML solution Commonly known as: CHRONULAC TAKE 30 ML BY MOUTH EVERY DAY What changed: See the new instructions.   Levemir FlexTouch 100 UNIT/ML FlexPen Generic drug: insulin detemir Inject 25 Units into the skin at bedtime. What changed: how much to take   LORazepam 0.5 MG tablet Commonly known as: ATIVAN Take 1 tablet (0.5 mg total) by mouth 2 (two) times daily as needed for anxiety.   LORazepam 0.5 MG tablet Commonly known as: ATIVAN TAKE 1 TABLET(0.5 MG) BY MOUTH TWICE DAILY AS NEEDED FOR ANXIETY   MAGNESIUM PO Take 500 mg by mouth daily.   metoprolol tartrate 25 MG tablet Commonly known as: LOPRESSOR Take 1 tablet (25 mg  total) by mouth as needed (use as needed for HR greater than 110).   multivitamin with minerals tablet Take 1 tablet by mouth daily.   pantoprazole 40 MG tablet Commonly known as: PROTONIX TAKE 1 TABLET(40 MG) BY MOUTH DAILY   PROBIOTIC DAILY PO Take 1 capsule by mouth daily. Digestive Advantage Probiotic   SYSTANE OP Place 1 drop into both eyes at bedtime.   VITAMIN B 12 PO Take 1,000 mcg by mouth daily.   Vitamin D-3 25 MCG (1000 UT) Caps Take 1,000 Units by mouth daily.   Xifaxan 550 MG Tabs tablet Generic drug: rifaximin Take 1 tablet (550 mg total) by mouth 2 (two) times daily.               Durable Medical Equipment  (From admission, onward)           Start     Ordered   02/25/22 1401  DME Walker rolling  Once       Question:  Patient needs a walker to treat with the following condition  Answer:  S/P lumbar fusion   02/25/22 1400   02/25/22  1401  DME 3 n 1  Once        02/25/22 1400            Disposition: home   Final Dx: extension of fusion L4-5 with posterior lateral arthrodesis and decompression        Signed: Ocie Cornfield Magaby Rumberger 02/25/2022, 3:59 PM

## 2022-02-25 NOTE — Inpatient Diabetes Management (Signed)
Inpatient Diabetes Program Recommendations  AACE/ADA: New Consensus Statement on Inpatient Glycemic Control (2015)  Target Ranges:  Prepandial:   less than 140 mg/dL      Peak postprandial:   less than 180 mg/dL (1-2 hours)      Critically ill patients:  140 - 180 mg/dL   Lab Results  Component Value Date   GLUCAP 109 (H) 02/25/2022   HGBA1C 10.1 (H) 01/04/2022    Review of Glycemic Control  Latest Reference Range & Units 02/25/22 08:42 02/25/22 12:32  Glucose-Capillary 70 - 99 mg/dL 115 (H) 109 (H)  (H): Data is abnormally high  Diabetes history: DM2 Outpatient Diabetes medications: Levemir 30 units QD, Jardiance 25 mg QD Current orders for Inpatient glycemic control:  Novolog 0-15 units TID and Jardiance 25 mg QD  Inpatient Diabetes Program Recommendations:    If glucose becomes elevated > 180, please add Levemir 15 units QD (50% of home dose).  Will continue to follow while inpatient.  Thank you, Reche Dixon, MSN, [place] Diabetes Coordinator Inpatient Diabetes Program [phone] (team pager from 8a-5p)

## 2022-02-25 NOTE — H&P (Signed)
Subjective: Patient is a 76 y.o. female admitted for back and leg pain. Onset of symptoms was several months ago, gradually worsening since that time.  The pain is rated severe, and is located at the across the lower back and radiates to both legs. The pain is described as aching and occurs all day. The symptoms have been progressive. Symptoms are exacerbated by exercise and standing. MRI or CT showed adjacent level degeneration and stenosis L4-5   Past Medical History:  Diagnosis Date   Chronic cystitis    Chronic diastolic CHF (congestive heart failure) ([place])    followed by dr Johnsie Cancel   Chronic kidney disease, stage I    Cirrhosis of liver without ascites ([place]) 03/2016   followed by dr [name] (GI);   due to fatty liver;   post op cabg 09/ 2017  , post paracentesis for ascites   Coronary artery disease 01/2016   cardiologist--- dr Frances Nickels;   01-27-2016  NSTEMI  s/p cardiac cath;   s/p CABG 01/29/16: LIMA-LAD, SVG-RI, SVG-dRCA   DDD (degenerative disc disease), cervical    per pt w/ chronic neck pain   Dyspnea    occasional   Edema of right lower extremity    Esophageal varices ([place])    followed by dr [name];   hx multiple banding of large varices   Essential hypertension    no meds   GERD (gastroesophageal reflux disease)    Heart murmur     DX FOR YEARS ASYMPTOMATIC   Hepatic encephalopathy ([place])    History of adenomatous polyp of colon    History of COVID-19 04/2020   per pt mild  symptoms that resolve   History of kidney stones    passed stones   History of non-ST elevation myocardial infarction (NSTEMI) 01/27/2016   Hyperlipidemia    diet controlled   NAFLD (nonalcoholic fatty liver disease)    Neuropathy, peripheral 04/01/2015   OA (osteoarthritis)    Paroxysmal A-fib ([place])    08/19/19 afib with RVR, spontaneously converted to SR   Portal vein thrombosis    per dr [name]  ,  chronic non-occluded   Thrombocytopenia ([place])    followed by pcp  and hematology-- dr Burr Medico;    likely secondary to liver cirrhosis   Type 2 diabetes mellitus treated with insulin ([place])    Type 2, followed by pcp    (07-21-2021  per pt checks blood sugar dialy in am fasting average 113--200s)   Urge incontinence of urine    Wears hearing aid in both ears     Past Surgical History:  Procedure Laterality Date   CARDIAC CATHETERIZATION N/A 01/27/2016   Procedure: Left Heart Cath and Coronary Angiography;  Surgeon: Belva Crome, MD;  Location: [facility];  Service: Cardiovascular;  Laterality: N/A;   COLONOSCOPY WITH PROPOFOL N/A 07/07/2016   Procedure: COLONOSCOPY WITH PROPOFOL;  Surgeon: [name], MD;  Location: [facility];  Service: Endoscopy;  Laterality: N/A;   CORONARY ARTERY BYPASS GRAFT N/A 01/28/2016   Procedure: CORONARY ARTERY BYPASS GRAFTING (CABG) x 3 USING RIGHT LEG GREATER SAPHENOUS VEIN GRAFT;  Surgeon: Melrose Nakayama, MD;  Location: [facility];  Service: Open Heart Surgery;  Laterality: N/A;   CYSTOSCOPY WITH RETROGRADE PYELOGRAM, URETEROSCOPY AND STENT PLACEMENT Left 05/30/2021   Procedure: CYSTOSCOPY WITH RETROGRADE PYELOGRAM, URETEROSCOPY AND STENT PLACEMENT;  Surgeon: Ceasar Mons, MD;  Location: [place];  Service: Urology;  Laterality: Left;   CYSTOSCOPY/URETEROSCOPY/HOLMIUM LASER/STENT PLACEMENT Left 07/26/2021  Procedure: CYSTOSCOPY/RETROGRADE/URETEROSCOPY/HOLMIUM LASER/STENT EXCHANGE;  Surgeon: Janith Lima, MD;  Location: [facility];  Service: Urology;  Laterality: Left;   ENDOVEIN HARVEST OF GREATER SAPHENOUS VEIN Right 01/28/2016   Procedure: ENDOVEIN HARVEST OF GREATER SAPHENOUS VEIN;  Surgeon: Melrose Nakayama, MD;  Location: [facility];  Service: Open Heart Surgery;  Laterality: Right;   ESOPHAGEAL BANDING  03/28/2019   Procedure: ESOPHAGEAL BANDING;  Surgeon: [name], MD;  Location: [facility];  Service: Endoscopy;;   ESOPHAGEAL BANDING  04/07/2019   Procedure: ESOPHAGEAL BANDING;  Surgeon: Juanita Craver, MD;  Location[facility];  Service: Endoscopy;;   ESOPHAGOGASTRODUODENOSCOPY N/A 04/07/2019   Procedure: ESOPHAGOGASTRODUODENOSCOPY (EGD);  Surgeon: Juanita Craver, MD;  Location: [facility];  Service: Endoscopy;  Laterality: N/A;   ESOPHAGOGASTRODUODENOSCOPY (EGD) WITH PROPOFOL N/A 07/07/2016   Procedure: ESOPHAGOGASTRODUODENOSCOPY (EGD) WITH PROPOFOL;  Surgeon: [name], MD;  [facility];  Service: Endoscopy;  Laterality: N/A;   ESOPHAGOGASTRODUODENOSCOPY (EGD) WITH PROPOFOL N/A 03/28/2019   Procedure: ESOPHAGOGASTRODUODENOSCOPY (EGD) WITH PROPOFOL;  Surgeon: [name], MD;  [facility]: [facility];  Service: Endoscopy;  Laterality: N/A;   EXTRACORPOREAL SHOCK WAVE LITHOTRIPSY  2010   HEMOSTASIS CLIP PLACEMENT  04/07/2019   Procedure: HEMOSTASIS CLIP PLACEMENT;  Surgeon: Juanita Craver, MD;  Location: [place];  Service: Endoscopy;;   IR ANGIOGRAM SELECTIVE EACH ADDITIONAL VESSEL  04/08/2019   IR EMBO ART  VEN HEMORR LYMPH EXTRAV  INC GUIDE ROADMAPPING  04/08/2019   IR PARACENTESIS  04/08/2019   IR RADIOLOGIST EVAL & MGMT  06/13/2019   IR RADIOLOGIST EVAL & MGMT  09/25/2019   IR RADIOLOGIST EVAL & MGMT  07/07/2020   IR RADIOLOGIST EVAL & MGMT  07/30/2021   IR TIPS  04/08/2019   MAXIMUM ACCESS (MAS)POSTERIOR LUMBAR INTERBODY FUSION (PLIF) 1 LEVEL Left 06/10/2015   Procedure: FOR MAXIMUM ACCESS (MAS) POSTERIOR LUMBAR INTERBODY FUSION (PLIF) LUMBAR THREE-FOUR EXTRAFORAMINAL MICRODISCECTOMY LUMBAR FIVE-SACRAL ONE LEFT;  Surgeon: Eustace Moore, MD;  Location: [facility];  Service: Neurosurgery;  Laterality: Left;   RADIOLOGY WITH ANESTHESIA N/A 04/08/2019   Procedure: RADIOLOGY WITH ANESTHESIA;  Surgeon: Radiologist, Medication, MD;  Location: [place];  Service: Radiology;  Laterality: N/A;   SCLEROTHERAPY  04/07/2019   Procedure: SCLEROTHERAPY;  Surgeon: Juanita Craver, MD;  Location: [facility];  Service: Endoscopy;;   TEE WITHOUT CARDIOVERSION N/A  01/28/2016   Procedure: TRANSESOPHAGEAL ECHOCARDIOGRAM (TEE);  Surgeon: Melrose Nakayama, MD;  Location: [place];  Service: Open Heart Surgery;  Laterality: N/A;   TUBAL LIGATION  1982   Dr Lynwood Dawley HERNIA REPAIR N/A 09/10/2019   Procedure: HERNIA REPAIR UMBILICAL ADULT;  Surgeon: Donnie Mesa, MD;  Location: [place];  Service: General;  Laterality: N/A;   VAGINAL HYSTERECTOMY  1997   Dr Rande Lawman    Prior to Admission medications   Medication Sig Start Date End Date Taking? Authorizing Provider  acetaminophen (TYLENOL) 650 MG CR tablet Take 1,300 mg by mouth every 8 (eight) hours as needed for pain.   Yes [provider]  Biotin 10000 MCG TABS Take 10,000 mcg by mouth daily.   Yes [provider]  Calcium Carb-Cholecalciferol (CALCIUM 600 + D PO) Take 1 tablet by mouth in the morning and at bedtime.   Yes [provider]  Cholecalciferol (VITAMIN D-3) 25 MCG (1000 UT) CAPS Take 1,000 Units by mouth daily.   Yes [provider]  Cyanocobalamin (VITAMIN B 12 PO) Take 1,000 mcg by mouth daily.  Yes [provider]  empagliflozin (JARDIANCE) 25 MG TABS tablet Take 1 tablet (25 mg total) by mouth daily. 08/12/21  Yes [name], MD  ezetimibe (ZETIA) 10 MG tablet Take 1 tablet (10 mg total) by mouth daily. Patient taking differently: Take 630 mg by mouth daily. 02/09/22  Yes [name], MD  fluticasone [facility]) 50 MCG/ACT nasal spray Place 1 spray into both nostrils daily as needed for allergies or rhinitis.   Yes [provider]  furosemide (LASIX) 20 MG tablet TAKE 1 TABLET(20 MG) BY MOUTH DAILY 05/26/21  Yes Josue Hector, MD  insulin detemir (LEVEMIR FLEXTOUCH) 100 UNIT/ML FlexPen Inject 25 Units into the skin at bedtime. Patient taking differently: Inject 30 Units into the skin at bedtime. 06/08/21  Yes Ngetich, Dinah C, NP  lactulose (CHRONULAC) 10 GM/15ML solution TAKE 30 ML BY MOUTH EVERY DAY Patient taking  differently: Take 20 g by mouth every 3 (three) days. 11/26/21  Yes [name], MD  LORazepam (ATIVAN) 0.5 MG tablet TAKE 1 TABLET(0.5 MG) BY MOUTH TWICE DAILY AS NEEDED FOR ANXIETY 10/27/21  Yes [name], MD  MAGNESIUM PO Take 500 mg by mouth daily.    Yes [provider]  Multiple Vitamins-Minerals (MULTIVITAMIN WITH MINERALS) tablet Take 1 tablet by mouth daily.   Yes [provider]  pantoprazole (PROTONIX) 40 MG tablet TAKE 1 TABLET(40 MG) BY MOUTH DAILY 10/25/21  Yes [name], MD  Polyethyl Glycol-Propyl Glycol (SYSTANE OP) Place 1 drop into both eyes at bedtime.   Yes [provider]  Probiotic Product (PROBIOTIC DAILY PO) Take 1 capsule by mouth daily. Digestive Advantage Probiotic   Yes [provider]  XIFAXAN 550 MG TABS tablet Take 1 tablet (550 mg total) by mouth 2 (two) times daily. 06/30/21  Yes [name], MD  glucose blood test strip One Touch Ultra Blue test strips. Use to test blood sugar three times daily. Dx: E11.65 02/09/22   [name], MD  Insulin Pen Needle (B-D ULTRAFINE III SHORT PEN) 31G X 8 MM MISC Use to give Insulin injections daily. Dx: E11.65 10/11/21   [name], MD  Insulin Syringe-Needle U-100 (INSULIN SYRINGE 1CC/31GX5/16") 31G X 5/16" 1 ML MISC USE AS DIRECTED TO INJECT LEVIMIR 07/29/20   [name], MD  LORazepam (ATIVAN) 0.5 MG tablet Take 1 tablet (0.5 mg total) by mouth 2 (two) times daily as needed for anxiety. 10/25/21   Lauree Chandler, NP  metoprolol tartrate (LOPRESSOR) 25 MG tablet Take 1 tablet (25 mg total) by mouth as needed (use as needed for HR greater than 110). 08/21/19   Tommie Raymond, NP   Allergies  Allergen Reactions   Kiwi Extract Anaphylaxis   Tdap [Tetanus-Diphth-Acell Pertussis] Swelling and Other (See Comments)    Swelling at injection site, gets very hot   Latex Itching, Dermatitis and Rash   Statins Other (See Comments)    RHABDOMYOLYSIS    Tramadol Nausea And Vomiting    Social History   Tobacco Use   Smoking status: Never   Smokeless tobacco: Never  Substance Use Topics   Alcohol use: No    Family History  Problem Relation Age of Onset   Heart disease Mother    Breast cancer Mother    Lung cancer Father    Arthritis Sister    Arthritis Brother    Liver cancer Brother    Lymphoma Brother    Heart disease Maternal Grandmother  Heart disease Maternal Grandfather    Heart disease Paternal Grandmother    Heart disease Paternal Grandfather    Other Daughter        house fire   Breast cancer Maternal Aunt    Breast cancer Paternal Aunt    Colon cancer Neg Hx    Esophageal cancer Neg Hx    Rectal cancer Neg Hx    Stomach cancer Neg Hx      Review of Systems  Positive ROS: neg  All other systems have been reviewed and were otherwise negative with the exception of those mentioned in the HPI and as above.  Objective: Vital signs in last 24 hours: Pulse Rate:  [67] 67 (10/20 0843) Resp:  [18] 18 (10/20 0843) BP: (138)/(53) 138/53 (10/20 0843) SpO2:  [98 %] 98 % (10/20 0843) Weight:  [57.2 kg] 57.2 kg (10/20 0843)  General Appearance: Alert, cooperative, no distress, appears stated age Head: Normocephalic, without obvious abnormality, atraumatic Eyes: PERRL, conjunctiva/corneas clear, EOM's intact    Neck: Supple, symmetrical, trachea midline Back: Symmetric, no curvature, ROM normal, no CVA tenderness Lungs:  respirations unlabored Heart: Regular rate and rhythm Abdomen: Soft, non-tender Extremities: Extremities normal, atraumatic, no cyanosis or edema Pulses: 2+ and symmetric all extremities Skin: Skin color, texture, turgor normal, no rashes or lesions  NEUROLOGIC:   Mental status: Alert and oriented x4,  no aphasia, good attention span, fund of knowledge, and memory Motor Exam - grossly normal Sensory Exam - grossly normal Reflexes: 1= Coordination - grossly normal Gait - grossly  normal Balance - grossly normal Cranial Nerves: I: smell Not tested  II: visual acuity  OS: nl    OD: nl  II: visual fields Full to confrontation  II: pupils Equal, round, reactive to light  III,VII: ptosis None  III,IV,VI: extraocular muscles  Full ROM  V: mastication Normal  V: facial light touch sensation  Normal  V,VII: corneal reflex  Present  VII: facial muscle function - upper  Normal  VII: facial muscle function - lower Normal  VIII: hearing Not tested  IX: soft palate elevation  Normal  IX,X: gag reflex Present  XI: trapezius strength  5/5  XI: sternocleidomastoid strength 5/5  XI: neck flexion strength  5/5  XII: tongue strength  Normal    Data Review Lab Results  Component Value Date   WBC 4.2 02/17/2022   HGB 14.2 02/17/2022   HCT 41.9 02/17/2022   MCV 92.7 02/17/2022   PLT 103 (L) 02/17/2022   Lab Results  Component Value Date   NA 140 02/17/2022   K 4.2 02/17/2022   CL 110 02/17/2022   CO2 26 02/17/2022   BUN 11 02/17/2022   CREATININE 0.74 02/17/2022   GLUCOSE 141 (H) 02/17/2022   Lab Results  Component Value Date   INR 1.1 02/17/2022    Assessment/Plan:  Estimated body mass index is 22.32 kg/m as calculated from the following:   Height as of this encounter: 5' 3"  (1.6 m).   Weight as of this encounter: 57.2 kg. Patient admitted for LL/ instrumetned fusion L4-5. Patient has failed a reasonable attempt at conservative therapy.  I explained the condition and procedure to the patient and answered any questions.  Patient wishes to proceed with procedure as planned. Understands risks/ benefits and typical outcomes of procedure.   Eustace Moore 02/25/2022 9:21 AM

## 2022-02-25 NOTE — Plan of Care (Signed)
  Problem: Education: Goal: Ability to verbalize activity precautions or restrictions will improve Outcome: Completed/Met Goal: Knowledge of the prescribed therapeutic regimen will improve Outcome: Completed/Met Goal: Understanding of discharge needs will improve Outcome: Completed/Met   Problem: Activity: Goal: Ability to avoid complications of mobility impairment will improve Outcome: Completed/Met Goal: Ability to tolerate increased activity will improve Outcome: Completed/Met Goal: Will remain free from falls Outcome: Completed/Met   Problem: Bowel/Gastric: Goal: Gastrointestinal status for postoperative course will improve Outcome: Completed/Met   Problem: Clinical Measurements: Goal: Ability to maintain clinical measurements within normal limits will improve Outcome: Completed/Met Goal: Postoperative complications will be avoided or minimized Outcome: Completed/Met Goal: Diagnostic test results will improve Outcome: Completed/Met   Problem: Pain Management: Goal: Pain level will decrease Outcome: Completed/Met   Problem: Skin Integrity: Goal: Will show signs of wound healing Outcome: Completed/Met   Problem: Health Behavior/Discharge Planning: Goal: Identification of resources available to assist in meeting health care needs will improve Outcome: Completed/Met   Problem: Bladder/Genitourinary: Goal: Urinary functional status for postoperative course will improve Outcome: Completed/Met

## 2022-02-25 NOTE — Progress Notes (Signed)
Patient alert and oriented, mae's well, voiding adequate amount of urine, swallowing without difficulty, no c/o pain at time of discharge. Patient discharged home with family. Script and discharged instructions given to patient. Patient and family stated understanding of instructions given. Patient has an appointment with Dr. Ronnald Ramp

## 2022-02-25 NOTE — Anesthesia Postprocedure Evaluation (Signed)
Anesthesia Post Note  Patient: [name]  Procedure(s) Performed: Laminectomy and Foraminotomy - Lumbar four-Lumbar five - bilateral, posterolateral arthrodesis Lumbar four-five, extension of hardware Lumbar four-five (Bilateral: Back)     Patient location during evaluation: PACU Anesthesia Type: General Level of consciousness: awake and alert Pain management: pain level controlled Vital Signs Assessment: post-procedure vital signs reviewed and stable Respiratory status: spontaneous breathing, nonlabored ventilation, respiratory function stable and patient connected to nasal cannula oxygen Cardiovascular status: blood pressure returned to baseline and stable Postop Assessment: no apparent nausea or vomiting Anesthetic complications: no   No notable events documented.  Last Vitals:  Vitals:   02/25/22 1315 02/25/22 1335  BP: (!) 121/57 138/70  Pulse: 74 75  Resp: 11 18  Temp:  36.5 C  SpO2: 95% 99%    Last Pain:  Vitals:   02/25/22 1315  TempSrc:   PainSc: 5                  Nielle Duford P Rhyse Skowron

## 2022-02-25 NOTE — Evaluation (Signed)
Occupational Therapy Evaluation Patient Details Name: [name] MRN: [id number] DOB: October 19, 1945 Today's Date: 02/25/2022   History of Present Illness 76 yo F s/p PLIF.  PMH includes: DM2, HTN, cirrhosis.   Clinical Impression   Patient admitted for the procedure above.  PTA she lives at home, and need no assist with ADL and iADL.  She does state she was only able to walk short distances due to back pain, but generally did not need an assistive device.  The patient does have expected post op discomfort, but is otherwise veery clones to her baseline.  She has the needed DME at home, and should do very well.  Precautions reviewed with good understanding, patient was able to complete her ADL with Min A, walk the halls at RW level, and ascend 4 stairs without assist.  No further needs in the acute setting, and no post acute OT anticipated.  Recommend follow up as prescribed by MD.        Recommendations for follow up therapy are one component of a multi-disciplinary discharge planning process, led by the attending physician.  Recommendations may be updated based on patient status, additional functional criteria and insurance authorization.   Follow Up Recommendations  No OT follow up    Assistance Recommended at Discharge Intermittent Supervision/Assistance  Patient can return home with the following A little help with bathing/dressing/bathroom;Assistance with cooking/housework;Assist for transportation;Help with stairs or ramp for entrance    Functional Status Assessment  Patient has had a recent decline in their functional status and demonstrates the ability to make significant improvements in function in a reasonable and predictable amount of time.  Equipment Recommendations  None recommended by OT    Recommendations for Other Services       Precautions / Restrictions Precautions Precautions: Back Precaution Booklet Issued: Yes (comment) Required Braces or Orthoses:  Spinal Brace Spinal Brace: Lumbar corset;Applied in sitting position Restrictions Weight Bearing Restrictions: No      Mobility Bed Mobility Overal bed mobility: Needs Assistance Bed Mobility: Sidelying to Sit, Sit to Sidelying   Sidelying to sit: Supervision     Sit to sidelying: Supervision      Transfers Overall transfer level: Needs assistance Equipment used: Rolling walker (2 wheels) Transfers: Sit to/from Stand Sit to Stand: Supervision                  Balance Overall balance assessment: Needs assistance Sitting-balance support: Feet supported Sitting balance-Leahy Scale: Good     Standing balance support: Reliant on assistive device for balance Standing balance-Leahy Scale: Fair                             ADL either performed or assessed with clinical judgement   ADL                   Upper Body Dressing : Set up;Sitting   Lower Body Dressing: Minimal assistance;Sit to/from stand   Toilet Transfer: Modified Independent;Regular Toilet;Rolling walker (2 wheels)                   Vision Patient Visual Report: No change from baseline       Perception Perception Perception: Not tested   Praxis Praxis Praxis: Not tested    Pertinent Vitals/Pain Pain Assessment Pain Assessment: Faces Faces Pain Scale: Hurts even more Pain Location: Incisional Pain Descriptors / Indicators: Tender, Grimacing Pain Intervention(s): Monitored during session  Hand Dominance Right   Extremity/Trunk Assessment Upper Extremity Assessment Upper Extremity Assessment: Overall WFL for tasks assessed   Lower Extremity Assessment Lower Extremity Assessment: Overall WFL for tasks assessed   Cervical / Trunk Assessment Cervical / Trunk Assessment: Back Surgery   Communication Communication Communication: HOH   Cognition Arousal/Alertness: Awake/alert Behavior During Therapy: WFL for tasks assessed/performed Overall Cognitive  Status: Within Functional Limits for tasks assessed                                       General Comments   VSS on RA    Exercises     Shoulder Instructions      Home Living Family/patient expects to be discharged to:: Private residence Living Arrangements: Spouse/significant other Available Help at Discharge: Family;Available 24 hours/day Type of Home: House Home Access: Stairs to enter [employer] of Steps: 4 Entrance Stairs-Rails: Right;Left Home Layout: One level     Bathroom Shower/Tub: Occupational psychologist: Handicapped height Bathroom Accessibility: Yes How Accessible: Accessible via walker Home Equipment: Shower seat;Grab bars - tub/shower;Rolling Environmental consultant (2 wheels)          Prior Functioning/Environment Prior Level of Function : Independent/Modified Independent;Driving             Mobility Comments: Communiity ambulator no AD ADLs Comments: Does ADLs and IADLs        OT Problem List: Pain      OT Treatment/Interventions:      OT Goals(Current goals can be found in the care plan section) Acute Rehab OT Goals Patient Stated Goal: Return home OT Goal Formulation: With patient Time For Goal Achievement: 02/28/22 Potential to Achieve Goals: Good  OT Frequency:      Co-evaluation              AM-PAC OT "6 Clicks" Daily Activity     Outcome Measure Help from another person eating meals?: None Help from another person taking care of personal grooming?: None Help from another person toileting, which includes using toliet, bedpan, or urinal?: A Little Help from another person bathing (including washing, rinsing, drying)?: A Little Help from another person to put on and taking off regular upper body clothing?: None Help from another person to put on and taking off regular lower body clothing?: A Little 6 Click Score: 21   End of Session Equipment Utilized During Treatment: Rolling walker (2 wheels);Back  brace Nurse Communication: Mobility status  Activity Tolerance: Patient tolerated treatment well Patient left: in bed;with call bell/phone within reach  OT Visit Diagnosis: Pain Pain - Right/Left:  (lumbar)                Time: 2202-5427 OT Time Calculation (min): 28 min Charges:  OT General Charges $OT Visit: 1 Visit OT Evaluation $OT Eval Moderate Complexity: 1 Mod OT Treatments $Self Care/Home Management : 8-22 mins  02/25/2022  RP, OTR/L  Acute Rehabilitation Services  Office:  [phone]   Metta Clines 02/25/2022, 5:20 PM

## 2022-02-25 NOTE — Progress Notes (Signed)
PHARMACIST - PHYSICIAN ORDER COMMUNICATION  CONCERNING: P&T Medication Policy on Herbal Medications  DESCRIPTION:  This patient's order for:  Biotin  has been noted.  This product(s) is classified as an "herbal" or natural product. Due to a lack of definitive safety studies or FDA approval, nonstandard manufacturing practices, plus the potential risk of unknown drug-drug interactions while on inpatient medications, the Pharmacy and Therapeutics Committee does not permit the use of "herbal" or natural products of this type within [facility].   ACTION TAKEN: The pharmacy department is unable to verify this order at this time and your patient has been informed of this safety policy. Please reevaluate patient's clinical condition at discharge and address if the herbal or natural product(s) should be resumed at that time.   Sherlon Handing, PharmD, BCPS Please see amion for complete clinical pharmacist phone list 02/25/2022 2:07 PM

## 2022-02-28 ENCOUNTER — Encounter (HOSPITAL_COMMUNITY): Payer: Self-pay | Admitting: Neurological Surgery

## 2022-03-07 ENCOUNTER — Ambulatory Visit (INDEPENDENT_AMBULATORY_CARE_PROVIDER_SITE_OTHER): Payer: Medicare Other | Admitting: Adult Health

## 2022-03-07 ENCOUNTER — Encounter: Payer: Self-pay | Admitting: Adult Health

## 2022-03-07 VITALS — BP 118/60 | HR 61 | Temp 98.0°F | Ht 63.0 in | Wt 125.6 lb

## 2022-03-07 DIAGNOSIS — I2510 Atherosclerotic heart disease of native coronary artery without angina pectoris: Secondary | ICD-10-CM | POA: Diagnosis not present

## 2022-03-07 DIAGNOSIS — L0231 Cutaneous abscess of buttock: Secondary | ICD-10-CM

## 2022-03-07 DIAGNOSIS — Z23 Encounter for immunization: Secondary | ICD-10-CM | POA: Diagnosis not present

## 2022-03-07 MED ORDER — MUPIROCIN 2 % EX OINT: 1.0000 | TOPICAL_OINTMENT | Freq: Two times a day (BID) | CUTANEOUS | 0 refills | Status: AC

## 2022-03-07 MED ORDER — DOXYCYCLINE HYCLATE 100 MG PO TABS: 100.0000 mg | ORAL_TABLET | Freq: Two times a day (BID) | ORAL | 0 refills | Status: AC

## 2022-03-07 NOTE — Progress Notes (Signed)
[facility] clinic  Provider:  Durenda Age DNP  Code Status:  Full Code  Goals of Care:     02/17/2022   11:33 AM  Advanced Directives  Does Patient Have a Medical Advance Directive? Yes  Type of Advance Directive [place] in Chart? No - copy requested     Chief Complaint  Patient presents with   Acute Visit    Patient complains of sore on right buttock.    HPI: Patient is a 76 y.o. female seen today for an acute visit for right inner buttock soreness. She has a PMH of hypertension, A-fib and CHF. She was noted to have a boil on her right inner buttock, erythematous rounded induration. It is tender to touch. She denies fever nor chills.  She was recently hospitalized on 02/25/22 wherein she underwent extension of fusion to L4-5 with posterior lateral athrodesis       Past Medical History:  Diagnosis Date   Chronic cystitis    Chronic diastolic CHF (congestive heart failure) ([place])    followed by dr Johnsie Cancel   Chronic kidney disease, stage I    Cirrhosis of liver without ascites ([place]) 03/2016   followed by dr [name] (GI);   due to fatty liver;   post op cabg 09/ 2017  , post paracentesis for ascites   Coronary artery disease 01/2016   cardiologist--- dr Frances Nickels;   01-27-2016  NSTEMI  s/p cardiac cath;   s/p CABG 01/29/16: LIMA-LAD, SVG-RI, SVG-dRCA   DDD (degenerative disc disease), cervical    per pt w/ chronic neck pain   Dyspnea    occasional   Edema of right lower extremity    Esophageal varices ([place])    followed by dr [name];   hx multiple banding of large varices   Essential hypertension    no meds   GERD (gastroesophageal reflux disease)    Heart murmur     DX FOR YEARS ASYMPTOMATIC   Hepatic encephalopathy ([place])    History of adenomatous polyp of colon    History of COVID-19 04/2020   per pt mild  symptoms that resolve   History of kidney stones    passed stones   History of non-ST elevation  myocardial infarction (NSTEMI) 01/27/2016   Hyperlipidemia    diet controlled   NAFLD (nonalcoholic fatty liver disease)    Neuropathy, peripheral 04/01/2015   OA (osteoarthritis)    Paroxysmal A-fib ([place])    08/19/19 afib with RVR, spontaneously converted to SR   Portal vein thrombosis    per dr [name]  ,  chronic non-occluded   Thrombocytopenia ([place])    followed by pcp  and hematology-- dr Burr Medico;   likely secondary to liver cirrhosis   Type 2 diabetes mellitus treated with insulin ([place])    Type 2, followed by pcp    (07-21-2021  per pt checks blood sugar dialy in am fasting average 113--200s)   Urge incontinence of urine    Wears hearing aid in both ears     Past Surgical History:  Procedure Laterality Date   CARDIAC CATHETERIZATION N/A 01/27/2016   Procedure: Left Heart Cath and Coronary Angiography;  Surgeon: Belva Crome, MD;  Location: [facility];  Service: Cardiovascular;  Laterality: N/A;   COLONOSCOPY WITH PROPOFOL N/A 07/07/2016   Procedure: COLONOSCOPY WITH PROPOFOL;  Surgeon: [name], MD;  Location: [facility];  Service: Endoscopy;  Laterality: N/A;   CORONARY  ARTERY BYPASS GRAFT N/A 01/28/2016   Procedure: CORONARY ARTERY BYPASS GRAFTING (CABG) x 3 USING RIGHT LEG GREATER SAPHENOUS VEIN GRAFT;  Surgeon: Melrose Nakayama, MD;  Location: [facility];  Service: Open Heart Surgery;  Laterality: N/A;   CYSTOSCOPY WITH RETROGRADE PYELOGRAM, URETEROSCOPY AND STENT PLACEMENT Left 05/30/2021   Procedure: CYSTOSCOPY WITH RETROGRADE PYELOGRAM, URETEROSCOPY AND STENT PLACEMENT;  Surgeon: Ceasar Mons, MD;  Location: [facility];  Service: Urology;  Laterality: Left;   CYSTOSCOPY/URETEROSCOPY/HOLMIUM LASER/STENT PLACEMENT Left 07/26/2021   Procedure: CYSTOSCOPY/RETROGRADE/URETEROSCOPY/HOLMIUM LASER/STENT EXCHANGE;  Surgeon: Janith Lima, MD;  Location: [facility];  Service: Urology;  Laterality: Left;   ENDOVEIN HARVEST OF GREATER SAPHENOUS VEIN  Right 01/28/2016   Procedure: ENDOVEIN HARVEST OF GREATER SAPHENOUS VEIN;  Surgeon: Melrose Nakayama, MD;  Location: [facility];  Service: Open Heart Surgery;  Laterality: Right;   ESOPHAGEAL BANDING  03/28/2019   Procedure: ESOPHAGEAL BANDING;  Surgeon: [name], MD;  Location[facility];  Service: Endoscopy;;   ESOPHAGEAL BANDING  04/07/2019   Procedure: ESOPHAGEAL BANDING;  Surgeon: Juanita Craver, MD;  Location: [facility] END[facility];  Service: Endoscopy;;   ESOPHAGOGASTRODUODENOSCOPY N/A 04/07/2019   Procedure: ESOPHAGOGASTRODUODENOSCOPY (EGD);  Surgeon: Juanita Craver, MD;  Location: [facility] ENDOSCOPY;  Service: Endoscopy;  Laterality: N/A;   ESOPHAGOGASTRODUODENOSCOPY (EGD) WITH PROPOFOL N/A 07/07/2016   Procedure: ESOPHAGOGASTRODUODENOSCOPY (EGD) WITH PROPOFOL;  Surgeon: [name], MD;  [facility];  Service: Endoscopy;  Laterality: N/A;   ESOPHAGOGASTRODUODENOSCOPY (EGD) WITH PROPOFOL N/A 03/28/2019   Procedure: ESOPHAGOGASTRODUODENOSCOPY (EGD) WITH PROPOFOL;  Surgeon: [name], MD;  [facility];  Service: Endoscopy;  Laterality: N/A;   EXTRACORPOREAL SHOCK WAVE LITHOTRIPSY  2010   HEMOSTASIS CLIP PLACEMENT  04/07/2019   Procedure: HEMOSTASIS CLIP PLACEMENT;  Surgeon: Juanita Craver, MD;  Location: [place] [facility];  Service: Endoscopy;;   IR ANGIOGRAM SELECTIVE EACH ADDITIONAL VESSEL  04/08/2019   IR EMBO ART  VEN HEMORR LYMPH EXTRAV  INC GUIDE ROADMAPPING  04/08/2019   IR PARACENTESIS  04/08/2019   IR RADIOLOGIST EVAL & MGMT  06/13/2019   IR RADIOLOGIST EVAL & MGMT  09/25/2019   IR RADIOLOGIST EVAL & MGMT  07/07/2020   IR RADIOLOGIST EVAL & MGMT  07/30/2021   IR TIPS  04/08/2019   LAMINECTOMY WITH POSTERIOR LATERAL ARTHRODESIS LEVEL 1 Bilateral 02/25/2022   Procedure: Laminectomy and Foraminotomy - Lumbar four-Lumbar five - bilateral, posterolateral arthrodesis Lumbar four-five, extension of hardware Lumbar four-five;  Surgeon: Eustace Moore, MD;  Location: [facility];  Service: Neurosurgery;  Laterality: Bilateral;   MAXIMUM ACCESS (MAS)POSTERIOR LUMBAR INTERBODY FUSION (PLIF) 1 LEVEL Left 06/10/2015   Procedure: FOR MAXIMUM ACCESS (MAS) POSTERIOR LUMBAR INTERBODY FUSION (PLIF) LUMBAR THREE-FOUR EXTRAFORAMINAL MICRODISCECTOMY LUMBAR FIVE-SACRAL ONE LEFT;  Surgeon: Eustace Moore, MD;  Location: [facility];  Service: Neurosurgery;  Laterality: Left;   RADIOLOGY WITH ANESTHESIA N/A 04/08/2019   Procedure: RADIOLOGY WITH ANESTHESIA;  Surgeon: Radiologist, Medication, MD;  Location: [facility];  Service: Radiology;  Laterality: N/A;   SCLEROTHERAPY  04/07/2019   Procedure: SCLEROTHERAPY;  Surgeon: Juanita Craver, MD;  Location: [facility] ENDOSCOPY;  Service: Endoscopy;;   TEE WITHOUT CARDIOVERSION N/A 01/28/2016   Procedure: TRANSESOPHAGEAL ECHOCARDIOGRAM (TEE);  Surgeon: Melrose Nakayama, MD;  Location: [facility];  Service: Open Heart Surgery;  Laterality: N/A;   TUBAL LIGATION  1982   Dr Lynwood Dawley HERNIA REPAIR N/A 09/10/2019   Procedure: HERNIA REPAIR UMBILICAL ADULT;  Surgeon: Donnie Mesa, MD;  Location: [facility]  OR;  Service: General;  Laterality: N/A;   VAGINAL HYSTERECTOMY  1997   Dr Rande Lawman    Allergies  Allergen Reactions   Kiwi Extract Anaphylaxis   Tdap [Tetanus-Diphth-Acell Pertussis] Swelling and Other (See Comments)    Swelling at injection site, gets very hot   Latex Itching, Dermatitis and Rash   Statins Other (See Comments)    RHABDOMYOLYSIS   Tramadol Nausea And Vomiting    Outpatient Encounter Medications as of 03/07/2022  Medication Sig   acetaminophen (TYLENOL) 650 MG CR tablet Take 1,300 mg by mouth every 8 (eight) hours as needed for pain.   Biotin 10000 MCG TABS Take 10,000 mcg by mouth daily.   Calcium Carb-Cholecalciferol (CALCIUM 600 + D PO) Take 1 tablet by mouth in the morning and at bedtime.   Cholecalciferol (VITAMIN D-3) 25 MCG (1000 UT) CAPS Take 1,000 Units by mouth daily.   Cyanocobalamin (VITAMIN B 12 PO) Take 1,000 mcg  by mouth daily.   empagliflozin (JARDIANCE) 25 MG TABS tablet Take 1 tablet (25 mg total) by mouth daily.   ezetimibe (ZETIA) 10 MG tablet Take 1 tablet (10 mg total) by mouth daily. (Patient taking differently: Take 630 mg by mouth daily.)   fluticasone (FLONASE) 50 MCG/ACT nasal spray Place 1 spray into both nostrils daily as needed for allergies or rhinitis.   furosemide (LASIX) 20 MG tablet TAKE 1 TABLET(20 MG) BY MOUTH DAILY   glucose blood test strip One Touch Ultra Blue test strips. Use to test blood sugar three times daily. Dx: E11.65   insulin detemir (LEVEMIR FLEXTOUCH) 100 UNIT/ML FlexPen Inject 25 Units into the skin at bedtime. (Patient taking differently: Inject 30 Units into the skin at bedtime.)   Insulin Pen Needle (B-D ULTRAFINE III SHORT PEN) 31G X 8 MM MISC Use to give Insulin injections daily. Dx: E11.65   Insulin Syringe-Needle U-100 (INSULIN SYRINGE 1CC/31GX5/16") 31G X 5/16" 1 ML MISC USE AS DIRECTED TO INJECT LEVIMIR   lactulose (CHRONULAC) 10 GM/15ML solution TAKE 30 ML BY MOUTH EVERY DAY (Patient taking differently: Take 20 g by mouth every 3 (three) days.)   LORazepam (ATIVAN) 0.5 MG tablet TAKE 1 TABLET(0.5 MG) BY MOUTH TWICE DAILY AS NEEDED FOR ANXIETY   LORazepam (ATIVAN) 0.5 MG tablet Take 1 tablet (0.5 mg total) by mouth 2 (two) times daily as needed for anxiety.   MAGNESIUM PO Take 500 mg by mouth daily.    metoprolol tartrate (LOPRESSOR) 25 MG tablet Take 1 tablet (25 mg total) by mouth as needed (use as needed for HR greater than 110).   Multiple Vitamins-Minerals (MULTIVITAMIN WITH MINERALS) tablet Take 1 tablet by mouth daily.   pantoprazole (PROTONIX) 40 MG tablet TAKE 1 TABLET(40 MG) BY MOUTH DAILY   Polyethyl Glycol-Propyl Glycol (SYSTANE OP) Place 1 drop into both eyes at bedtime.   Probiotic Product (PROBIOTIC DAILY PO) Take 1 capsule by mouth daily. Digestive Advantage Probiotic   XIFAXAN 550 MG TABS tablet Take 1 tablet (550 mg total) by mouth 2 (two)  times daily.   No facility-administered encounter medications on file as of 03/07/2022.    Review of Systems:  Review of Systems  Constitutional:  Negative for appetite change, chills, fatigue and fever.  HENT:  Negative for congestion, hearing loss, rhinorrhea and sore throat.   Eyes: Negative.   Respiratory:  Negative for cough, shortness of breath and wheezing.   Cardiovascular:  Negative for chest pain, palpitations and leg swelling.  Gastrointestinal:  Negative for abdominal pain,  constipation, diarrhea, nausea and vomiting.  Genitourinary:  Negative for dysuria.  Musculoskeletal:  Negative for arthralgias, back pain and myalgias.  Skin:  Positive for color change. Negative for rash and wound.  Neurological:  Negative for dizziness, weakness and headaches.  Psychiatric/Behavioral:  Negative for behavioral problems. The patient is not nervous/anxious.     Health Maintenance  Topic Date Due   Zoster Vaccines- Shingrix (1 of 2) Never done   OPHTHALMOLOGY EXAM  08/24/2018   Diabetic kidney evaluation - Urine ACR  06/15/2021   COLONOSCOPY (Pts 45-82yr Insurance coverage will need to be confirmed)  07/07/2021   INFLUENZA VACCINE  12/07/2021   HEMOGLOBIN A1C  07/07/2022   Medicare Annual Wellness (AWV)  08/11/2022   FOOT EXAM  01/05/2023   Diabetic kidney evaluation - GFR measurement  02/18/2023   Pneumonia Vaccine 75 Years old  Completed   DEXA SCAN  Completed   Hepatitis C Screening  Completed   HPV VACCINES  Aged Out   TETANUS/TDAP  Discontinued   COVID-19 Vaccine  Discontinued    Physical Exam: Vitals:   03/07/22 1024  Height: 5' 3"  (1.6 m)   Body mass index is 22.32 kg/m. Physical Exam Constitutional:      Appearance: Normal appearance.  HENT:     Head: Normocephalic and atraumatic.     Nose: Nose normal.     Mouth/Throat:     Mouth: Mucous membranes are moist.  Eyes:     Conjunctiva/sclera: Conjunctivae normal.  Cardiovascular:     Rate and Rhythm:  Normal rate and regular rhythm.  Pulmonary:     Effort: Pulmonary effort is normal.     Breath sounds: Normal breath sounds.  Abdominal:     General: Bowel sounds are normal.     Palpations: Abdomen is soft.  Musculoskeletal:        General: Normal range of motion.     Cervical back: Normal range of motion.  Skin:    General: Skin is warm and dry.     Comments: Right inner buttock erythematous rounded induration.  Neurological:     General: No focal deficit present.     Mental Status: She is alert and oriented to person, place, and time.  Psychiatric:        Mood and Affect: Mood normal.        Behavior: Behavior normal.        Thought Content: Thought content normal.        Judgment: Judgment normal.     Labs reviewed: Basic Metabolic Panel: Recent Labs    11/08/21 1005 01/26/22 1118 02/17/22 1216  NA 142 139 140  K 3.3* 3.6 4.2  CL 107 105 110  CO2 26 26 26   GLUCOSE 165* 107* 141*  BUN 20 17 11   CREATININE 0.80 0.79 0.74  CALCIUM 9.4 9.3 9.1   Liver Function Tests: Recent Labs    11/08/21 1005 01/26/22 1118 02/17/22 1216  AST 43* 40 49*  ALT 35 27 34  ALKPHOS 93 60 77  BILITOT 1.5* 2.1* 1.9*  PROT 7.0 6.5 6.7  ALBUMIN 3.6 3.2* 3.4*   Recent Labs    03/11/21 1246 05/29/21 1842  LIPASE 26 28   No results for input(s): "AMMONIA" in the last 8760 hours. CBC: Recent Labs    06/08/21 1140 06/15/21 0904 11/08/21 1005 01/26/22 1118 02/17/22 1216  WBC 4.6   < > 5.4 5.7 4.2  NEUTROABS 2,907  --  4.1 3.8  --   HGB  13.9   < > 14.5 14.5 14.2  HCT 41.5   < > 42.2 43.8 41.9  MCV 94.7   < > 90.2 93.2 92.7  PLT 109*   < > 96* 107* 103*   < > = values in this interval not displayed.   Lipid Panel: Recent Labs    01/04/22 1434  CHOL 174  HDL 89  LDLCALC 68  TRIG 91  CHOLHDL 2.0   Lab Results  Component Value Date   HGBA1C 10.1 (H) 01/04/2022    Procedures since last visit: DG Lumbar Spine 2-3 Views  Result Date: 02/25/2022 CLINICAL DATA:   Elective surgery. L4-L5 laminectomy and foraminotomy, posterior-lateral arthrodesis, extension of hardware. EXAM: LUMBAR SPINE - 2-3 VIEW COMPARISON:  Radiograph 12/07/2021 FINDINGS: Two fluoroscopic spot views of the lumbar spine obtained in the operating room in frontal and lateral projections. New posterior rod and intrapedicular screw fusion at L5. The intrapedicular screws at L3 and L4 are again seen. L3-L4 interbody spacer in place. Fluoroscopy time 36.6 seconds. Dose 18.28 mGy. IMPRESSION: Intraoperative fluoroscopy during lumbar fusion. Electronically Signed   By: Keith Rake M.D.   On: 02/25/2022 13:21   DG C-Arm 1-60 Min-No Report  Result Date: 02/25/2022 Fluoroscopy was utilized by the requesting physician.  No radiographic interpretation.   DG C-Arm 1-60 Min-No Report  Result Date: 02/25/2022 Fluoroscopy was utilized by the requesting physician.  No radiographic interpretation.   LONG TERM MONITOR (3-14 DAYS)  Result Date: 02/11/2022 Patch Wear Time:  2 days and 17 hours (2023-09-23T15:03:34-0400 to 2023-09-26T08:08:25-0400) Patient had a min HR of 54 bpm, max HR of 148 bpm, and avg HR of 69 bpm. Predominant underlying rhythm was Sinus Rhythm. Bundle Branch Block/IVCD was present. 5 Supraventricular Tachycardia runs occurred, the run with the fastest interval lasting 6 beats  with a max rate of 148 bpm, the longest lasting 8 beats with an avg rate of 119 bpm. Isolated SVEs were rare (<1.0%), SVE Couplets were rare (<1.0%), and SVE Triplets were rare (<1.0%). Isolated VEs were rare (<1.0%), and no VE Couplets or VE Triplets were present. Inverted QRS complexes possibly due to inverted placement of device. [name] MD [facility]    Assessment/Plan  1. Abscess of buttock, right -   instructed to cleanse area with saline then apply Mupirocin BID - mupirocin ointment (BACTROBAN) 2 %; Apply 1 Application topically 2 (two) times daily for 14 days.  Dispense: 22 g; Refill: 0 - doxycycline  (VIBRA-TABS) 100 MG tablet; Take 1 tablet (100 mg total) by mouth 2 (two) times daily for 14 days.  Dispense: 28 tablet; Refill: 0  2. Flu vaccine need - Flu Vaccine QUAD High Dose(Fluad)     Labs/tests ordered:   None   Next appt:  05/18/2022

## 2022-03-07 NOTE — Patient Instructions (Addendum)
Skin Abscess  A skin abscess is an infected area of your skin that contains pus and other material. An abscess can happen in any part of your body. Some abscesses break open (rupture) on their own. Most continue to get worse unless they are treated. The infection can spread deeper into the body and into your blood, which can make you feel sick. A skin abscess is caused by germs that enter the skin through a cut or scrape. It can also be caused by blocked oil and sweat glands or infected hair follicles. This condition is usually treated by: Draining the pus. Taking antibiotic medicines. Placing a warm, wet washcloth over the abscess. Follow these instructions at home: Medicines  Take over-the-counter and prescription medicines only as told by your doctor. If you were prescribed an antibiotic medicine, take it as told by your doctor. Do not stop taking the antibiotic even if you start to feel better. Abscess care  If you have an abscess that has not drained, place a warm, clean, wet washcloth over the abscess several times a day. Do this as told by your doctor. Follow instructions from your doctor about how to take care of your abscess. Make sure you: Cover the abscess with a bandage (dressing). Change your bandage or gauze as told by your doctor. Wash your hands with soap and water before you change the bandage or gauze. If you cannot use soap and water, use hand sanitizer. Check your abscess every day for signs that the infection is getting worse. Check for: More redness, swelling, or pain. More fluid or blood. Warmth. More pus or a bad smell. General instructions To avoid spreading the infection: Do not share personal care items, towels, or hot tubs with others. Avoid making skin-to-skin contact with other people. Keep all follow-up visits as told by your doctor. This is important. Contact a doctor if: You have more redness, swelling, or pain around your abscess. You have more fluid  or blood coming from your abscess. Your abscess feels warm when you touch it. You have more pus or a bad smell coming from your abscess. Your muscles ache. You feel sick. Get help right away if: You have very bad (severe) pain. You see red streaks on your skin spreading away from the abscess. You see redness that spreads quickly. You have a fever or chills. Summary A skin abscess is an infected area of your skin that contains pus and other material. The abscess is caused by germs that enter the skin through a cut or scrape. It can also be caused by blocked oil and sweat glands or infected hair follicles. Follow your doctor's instructions on caring for your abscess, taking medicines, preventing infections, and keeping follow-up visits. This information is not intended to replace advice given to you by your health care provider. Make sure you discuss any questions you have with your health care provider. Document Revised: 07/29/2021 Document Reviewed: 02/01/2021 Elsevier Patient Education  [place].

## 2022-03-14 ENCOUNTER — Other Ambulatory Visit: Payer: Self-pay

## 2022-03-14 MED ORDER — EMPAGLIFLOZIN 25 MG PO TABS: 25.0000 mg | ORAL_TABLET | Freq: Every day | ORAL | 1 refills | Status: DC

## 2022-03-16 ENCOUNTER — Other Ambulatory Visit: Payer: Self-pay | Admitting: Cardiovascular Disease

## 2022-03-18 ENCOUNTER — Other Ambulatory Visit: Payer: Self-pay

## 2022-03-18 ENCOUNTER — Encounter (HOSPITAL_COMMUNITY): Payer: Self-pay | Admitting: Neurological Surgery

## 2022-03-18 DIAGNOSIS — N951 Menopausal and female climacteric states: Secondary | ICD-10-CM

## 2022-03-18 NOTE — Telephone Encounter (Signed)
Refill request received from pharmacy for Lorazepam 0.5 mg tablet take one tablet by mouth twice daily as needed for anxiety. Medication last filled 10/27/2021.  Medication pended and sent to Marlowe Sax, NP

## 2022-03-20 MED ORDER — LORAZEPAM 0.5 MG PO TABS: 0.5000 mg | ORAL_TABLET | Freq: Two times a day (BID) | ORAL | 1 refills | Status: DC | PRN

## 2022-03-23 ENCOUNTER — Telehealth: Payer: Self-pay | Admitting: Pharmacy Technician

## 2022-03-23 DIAGNOSIS — Z5986 Financial insecurity: Secondary | ICD-10-CM

## 2022-03-23 DIAGNOSIS — Z596 Low income: Secondary | ICD-10-CM

## 2022-03-23 NOTE — Progress Notes (Addendum)
ADDENDUM 55/05/6142  Refaxed application to [name] as they informed they did not received the fax on 03/23/22.  Ferdinand Revoir P. Zona Pedro, [place]  [phone]                                         [facility])                                            [place] Team    03/23/2022  Lilianah Buffin 09-Dec-1945 [id number]  Received both patient and provider portion(s) of patient assistance application(s) for [employer]. Faxed completed application and required documents into [name].    Kayin Osment P. Alaina Donati, [place]  [phone]

## 2022-03-24 ENCOUNTER — Ambulatory Visit (INDEPENDENT_AMBULATORY_CARE_PROVIDER_SITE_OTHER): Payer: Medicare Other | Admitting: Adult Health

## 2022-03-24 ENCOUNTER — Encounter: Payer: Self-pay | Admitting: Adult Health

## 2022-03-24 VITALS — BP 116/62 | HR 62 | Temp 97.1°F | Ht 63.0 in | Wt 124.0 lb

## 2022-03-24 DIAGNOSIS — F5101 Primary insomnia: Secondary | ICD-10-CM | POA: Diagnosis not present

## 2022-03-24 DIAGNOSIS — E1159 Type 2 diabetes mellitus with other circulatory complications: Secondary | ICD-10-CM | POA: Diagnosis not present

## 2022-03-24 DIAGNOSIS — I5032 Chronic diastolic (congestive) heart failure: Secondary | ICD-10-CM

## 2022-03-24 DIAGNOSIS — I480 Paroxysmal atrial fibrillation: Secondary | ICD-10-CM

## 2022-03-24 DIAGNOSIS — N390 Urinary tract infection, site not specified: Secondary | ICD-10-CM

## 2022-03-24 DIAGNOSIS — Z794 Long term (current) use of insulin: Secondary | ICD-10-CM

## 2022-03-24 DIAGNOSIS — R300 Dysuria: Secondary | ICD-10-CM | POA: Diagnosis not present

## 2022-03-24 DIAGNOSIS — I2510 Atherosclerotic heart disease of native coronary artery without angina pectoris: Secondary | ICD-10-CM

## 2022-03-24 LAB — POCT URINALYSIS DIPSTICK
Bilirubin, UA: NEGATIVE
Glucose, UA: POSITIVE — AB
Ketones, UA: NEGATIVE
Nitrite, UA: NEGATIVE
Protein, UA: POSITIVE — AB
Spec Grav, UA: 1.025 (ref 1.010–1.025)
Urobilinogen, UA: 0.2 E.U./dL
pH, UA: 5 (ref 5.0–8.0)

## 2022-03-24 MED ORDER — LORAZEPAM 0.5 MG PO TABS: 0.5000 mg | ORAL_TABLET | Freq: Every evening | ORAL | 1 refills | Status: DC | PRN

## 2022-03-24 MED ORDER — AMOXICILLIN-POT CLAVULANATE 875-125 MG PO TABS: 1.0000 | ORAL_TABLET | Freq: Two times a day (BID) | ORAL | 0 refills | Status: AC

## 2022-03-24 NOTE — Patient Instructions (Signed)
Urinary Tract Infection, Adult A urinary tract infection (UTI) is an infection of any part of the urinary tract. The urinary tract includes: The kidneys. The ureters. The bladder. The urethra. These organs make, store, and get rid of pee (urine) in the body. What are the causes? This infection is caused by germs (bacteria) in your genital area. These germs grow and cause swelling (inflammation) of your urinary tract. What increases the risk? The following factors may make you more likely to develop this condition: Using a small, thin tube (catheter) to drain pee. Not being able to control when you pee or poop (incontinence). Being female. If you are female, these things can increase the risk: Using these methods to prevent pregnancy: A medicine that kills sperm (spermicide). A device that blocks sperm (diaphragm). Having low levels of a female hormone (estrogen). Being pregnant. You are more likely to develop this condition if: You have genes that add to your risk. You are sexually active. You take antibiotic medicines. You have trouble peeing because of: A prostate that is bigger than normal, if you are female. A blockage in the part of your body that drains pee from the bladder. A kidney stone. A nerve condition that affects your bladder. Not getting enough to drink. Not peeing often enough. You have other conditions, such as: Diabetes. A weak disease-fighting system (immune system). Sickle cell disease. Gout. Injury of the spine. What are the signs or symptoms? Symptoms of this condition include: Needing to pee right away. Peeing small amounts often. Pain or burning when peeing. Blood in the pee. Pee that smells bad or not like normal. Trouble peeing. Pee that is cloudy. Fluid coming from the vagina, if you are female. Pain in the belly or lower back. Other symptoms include: Vomiting. Not feeling hungry. Feeling mixed up (confused). This may be the first symptom in  older adults. Being tired and grouchy (irritable). A fever. Watery poop (diarrhea). How is this treated? Taking antibiotic medicine. Taking other medicines. Drinking enough water. In some cases, you may need to see a specialist. Follow these instructions at home:  Medicines Take over-the-counter and prescription medicines only as told by your doctor. If you were prescribed an antibiotic medicine, take it as told by your doctor. Do not stop taking it even if you start to feel better. General instructions Make sure you: Pee until your bladder is empty. Do not hold pee for a long time. Empty your bladder after sex. Wipe from front to back after peeing or pooping if you are a female. Use each tissue one time when you wipe. Drink enough fluid to keep your pee pale yellow. Keep all follow-up visits. Contact a doctor if: You do not get better after 1-2 days. Your symptoms go away and then come back. Get help right away if: You have very bad back pain. You have very bad pain in your lower belly. You have a fever. You have chills. You feeling like you will vomit or you vomit. Summary A urinary tract infection (UTI) is an infection of any part of the urinary tract. This condition is caused by germs in your genital area. There are many risk factors for a UTI. Treatment includes antibiotic medicines. Drink enough fluid to keep your pee pale yellow. This information is not intended to replace advice given to you by your health care provider. Make sure you discuss any questions you have with your health care provider. Document Revised: 12/06/2019 Document Reviewed: 12/06/2019 Elsevier Patient Education    2023 Elsevier Inc.  

## 2022-03-24 NOTE — Progress Notes (Signed)
[facility] clinic  Provider:  Durenda Age DNP  Code Status:  Full Code  Goals of Care:     03/07/2022   10:56 AM  Advanced Directives  Does Patient Have a Medical Advance Directive? Yes  Type of Paramedic of [place];Living will  Does patient want to make changes to medical advance directive? No - Patient declined  Copy of [place] in Chart? Yes - validated most recent copy scanned in chart (See row information)     Chief Complaint  Patient presents with   Follow-up    2 week follow-up. Discuss Jardiance dose, patient thinks it is listed wrong. Discuss Lactulose, patient is taking differently then prescribed. Discuss Lorazepam, patient is taking everyday verse as needed, requesting higerg dispense number and change of instructions. Discuss metoprolol, patient is taking differently then prescribed.    Urinary Tract Infection    Patient c/o possible uti, burning when urinating x 1.5 weeks     HPI: Patient is a 76 y.o. female seen today for management of chronic illnesses. She has a PMH of hypertension, CAD s/p CABG, PVCs A-fib ( not on anticoagulation due to history of portal vein thrombosis and large varices with prior bleeding), CHF, diabetes mellitus, ascites/cirrhosis and bilateral carotid artery disease. She complained today of having dysuria which started 1.5 weeks ago. At first she noted a little bit of blood in the urine and now none. She denies fever nor chills.   She is confused as to how much Jardiance she is supposed to take. Discussed that per medication list, she should be taking Jardiance 25 mg daily.  Wt Readings from Last 3 Encounters:  03/24/22 124 lb (56.2 kg)  03/07/22 125 lb 9.6 oz (57 kg)  02/25/22 126 lb (57.2 kg)     Past Medical History:  Diagnosis Date   Chronic cystitis    Chronic diastolic CHF (congestive heart failure) ([place])    followed by dr Johnsie Cancel   Chronic kidney disease, stage I    Cirrhosis of  liver without ascites ([place]) 03/2016   followed by dr [name] (GI);   due to fatty liver;   post op cabg 09/ 2017  , post paracentesis for ascites   Coronary artery disease 01/2016   cardiologist--- dr Frances Nickels;   01-27-2016  NSTEMI  s/p cardiac cath;   s/p CABG 01/29/16: LIMA-LAD, SVG-RI, SVG-dRCA   DDD (degenerative disc disease), cervical    per pt w/ chronic neck pain   Dyspnea    occasional   Edema of right lower extremity    Esophageal varices ([place])    followed by dr [name];   hx multiple banding of large varices   Essential hypertension    no meds   GERD (gastroesophageal reflux disease)    Heart murmur     DX FOR YEARS ASYMPTOMATIC   Hepatic encephalopathy ([place])    History of adenomatous polyp of colon    History of COVID-19 04/2020   per pt mild  symptoms that resolve   History of kidney stones    passed stones   History of non-ST elevation myocardial infarction (NSTEMI) 01/27/2016   Hyperlipidemia    diet controlled   NAFLD (nonalcoholic fatty liver disease)    Neuropathy, peripheral 04/01/2015   OA (osteoarthritis)    Paroxysmal A-fib ([place])    08/19/19 afib with RVR, spontaneously converted to SR   Portal vein thrombosis    per dr [name]  ,  chronic non-occluded  Thrombocytopenia ([place])    followed by pcp  and hematology-- dr Burr Medico;   likely secondary to liver cirrhosis   Type 2 diabetes mellitus treated with insulin ([place])    Type 2, followed by pcp    (07-21-2021  per pt checks blood sugar dialy in am fasting average 113--200s)   Urge incontinence of urine    Wears hearing aid in both ears     Past Surgical History:  Procedure Laterality Date   CARDIAC CATHETERIZATION N/A 01/27/2016   Procedure: Left Heart Cath and Coronary Angiography;  Surgeon: Belva Crome, MD;  Location: [place] CV LAB;  Service: Cardiovascular;  Laterality: N/A;   COLONOSCOPY WITH PROPOFOL N/A 07/07/2016   Procedure: COLONOSCOPY WITH PROPOFOL;  Surgeon: [name], MD;  Location: [facility]  ENDOSCOPY;  Service: Endoscopy;  Laterality: N/A;   CORONARY ARTERY BYPASS GRAFT N/A 01/28/2016   Procedure: CORONARY ARTERY BYPASS GRAFTING (CABG) x 3 USING RIGHT LEG GREATER SAPHENOUS VEIN GRAFT;  Surgeon: Melrose Nakayama, MD;  Location: [place];  Service: Open Heart Surgery;  Laterality: N/A;   CYSTOSCOPY WITH RETROGRADE PYELOGRAM, URETEROSCOPY AND STENT PLACEMENT Left 05/30/2021   Procedure: CYSTOSCOPY WITH RETROGRADE PYELOGRAM, URETEROSCOPY AND STENT PLACEMENT;  Surgeon: Ceasar Mons, MD;  Location: [place];  Service: Urology;  Laterality: Left;   CYSTOSCOPY/URETEROSCOPY/HOLMIUM LASER/STENT PLACEMENT Left 07/26/2021   Procedure: CYSTOSCOPY/RETROGRADE/URETEROSCOPY/HOLMIUM LASER/STENT EXCHANGE;  Surgeon: Janith Lima, MD;  Location: [facility];  Service: Urology;  Laterality: Left;   ENDOVEIN HARVEST OF GREATER SAPHENOUS VEIN Right 01/28/2016   Procedure: ENDOVEIN HARVEST OF GREATER SAPHENOUS VEIN;  Surgeon: Melrose Nakayama, MD;  Location: [place];  Service: Open Heart Surgery;  Laterality: Right;   ESOPHAGEAL BANDING  03/28/2019   Procedure: ESOPHAGEAL BANDING;  Surgeon: [name], MD;  Location: [facility];  Service: Endoscopy;;   ESOPHAGEAL BANDING  04/07/2019   Procedure: ESOPHAGEAL BANDING;  Surgeon: Juanita Craver, MD;  Location: [facility] ENDOSCOPY;  Service: Endoscopy;;   ESOPHAGOGASTRODUODENOSCOPY N/A 04/07/2019   Procedure: ESOPHAGOGASTRODUODENOSCOPY (EGD);  Surgeon: Juanita Craver, MD;  Location: [facility] ENDOSCOPY;  Service: Endoscopy;  Laterality: N/A;   ESOPHAGOGASTRODUODENOSCOPY (EGD) WITH PROPOFOL N/A 07/07/2016   Procedure: ESOPHAGOGASTRODUODENOSCOPY (EGD) WITH PROPOFOL;  Surgeon: [name], MD;  Location: [facility];  Service: Endoscopy;  Laterality: N/A;   ESOPHAGOGASTRODUODENOSCOPY (EGD) WITH PROPOFOL N/A 03/28/2019   Procedure: ESOPHAGOGASTRODUODENOSCOPY (EGD) WITH PROPOFOL;  Surgeon: [name], MD;  Location: [facility];  Service:  Endoscopy;  Laterality: N/A;   EXTRACORPOREAL SHOCK WAVE LITHOTRIPSY  2010   HEMOSTASIS CLIP PLACEMENT  04/07/2019   Procedure: HEMOSTASIS CLIP PLACEMENT;  Surgeon: Juanita Craver, MD;  Location: [place];  Service: Endoscopy;;   IR ANGIOGRAM SELECTIVE EACH ADDITIONAL VESSEL  04/08/2019   IR EMBO ART  VEN HEMORR LYMPH EXTRAV  INC GUIDE ROADMAPPING  04/08/2019   IR PARACENTESIS  04/08/2019   IR RADIOLOGIST EVAL & MGMT  06/13/2019   IR RADIOLOGIST EVAL & MGMT  09/25/2019   IR RADIOLOGIST EVAL & MGMT  07/07/2020   IR RADIOLOGIST EVAL & MGMT  07/30/2021   IR TIPS  04/08/2019   LAMINECTOMY WITH POSTERIOR LATERAL ARTHRODESIS LEVEL 1 Bilateral 02/25/2022   Procedure: Laminectomy and Foraminotomy - Lumbar four-Lumbar five - bilateral, posterolateral arthrodesis Lumbar four-five, extension of hardware Lumbar four-five;  Surgeon: Eustace Moore, MD;  Location: [place];  Service: Neurosurgery;  Laterality: Bilateral;   MAXIMUM ACCESS (MAS)POSTERIOR LUMBAR INTERBODY FUSION (PLIF) 1 LEVEL Left 06/10/2015   Procedure: FOR  MAXIMUM ACCESS (MAS) POSTERIOR LUMBAR INTERBODY FUSION (PLIF) LUMBAR THREE-FOUR EXTRAFORAMINAL MICRODISCECTOMY LUMBAR FIVE-SACRAL ONE LEFT;  Surgeon: Eustace Moore, MD;  Location: [place] NEURO ORS;  Service: Neurosurgery;  Laterality: Left;   RADIOLOGY WITH ANESTHESIA N/A 04/08/2019   Procedure: RADIOLOGY WITH ANESTHESIA;  Surgeon: Radiologist, Medication, MD;  Location: [place];  Service: Radiology;  Laterality: N/A;   SCLEROTHERAPY  04/07/2019   Procedure: SCLEROTHERAPY;  Surgeon: Juanita Craver, MD;  Location: [facility] ENDOSCOPY;  Service: Endoscopy;;   TEE WITHOUT CARDIOVERSION N/A 01/28/2016   Procedure: TRANSESOPHAGEAL ECHOCARDIOGRAM (TEE);  Surgeon: Melrose Nakayama, MD;  Location: [place];  Service: Open Heart Surgery;  Laterality: N/A;   TUBAL LIGATION  1982   Dr Lynwood Dawley HERNIA REPAIR N/A 09/10/2019   Procedure: HERNIA REPAIR UMBILICAL ADULT;  Surgeon: Donnie Mesa, MD;   Location: [place];  Service: General;  Laterality: N/A;   VAGINAL HYSTERECTOMY  1997   Dr Rande Lawman    Allergies  Allergen Reactions   Kiwi Extract Anaphylaxis   Tdap [Tetanus-Diphth-Acell Pertussis] Swelling and Other (See Comments)    Swelling at injection site, gets very hot   Latex Itching, Dermatitis and Rash   Statins Other (See Comments)    RHABDOMYOLYSIS   Tramadol Nausea And Vomiting    Outpatient Encounter Medications as of 03/24/2022  Medication Sig   acetaminophen (TYLENOL) 650 MG CR tablet Take 1,300 mg by mouth every 8 (eight) hours as needed for pain.   Biotin 10000 MCG TABS Take 10,000 mcg by mouth daily.   Calcium Carb-Cholecalciferol (CALCIUM 600 + D PO) Take 1 tablet by mouth in the morning and at bedtime.   Cholecalciferol (VITAMIN D-3) 25 MCG (1000 UT) CAPS Take 1,000 Units by mouth daily.   Cyanocobalamin (VITAMIN B 12 PO) Take 1,000 mcg by mouth daily.   empagliflozin (JARDIANCE) 25 MG TABS tablet Take 1 tablet (25 mg total) by mouth daily.   ezetimibe (ZETIA) 10 MG tablet Take 1 tablet (10 mg total) by mouth daily.   fluticasone (FLONASE) 50 MCG/ACT nasal spray Place 1 spray into both nostrils daily as needed for allergies or rhinitis.   furosemide (LASIX) 20 MG tablet TAKE 1 TABLET(20 MG) BY MOUTH DAILY   glucose blood test strip One Touch Ultra Blue test strips. Use to test blood sugar three times daily. Dx: E11.65   insulin detemir (LEVEMIR FLEXTOUCH) 100 UNIT/ML FlexPen Inject 25 Units into the skin at bedtime.   Insulin Pen Needle (B-D ULTRAFINE III SHORT PEN) 31G X 8 MM MISC Use to give Insulin injections daily. Dx: E11.65   Insulin Syringe-Needle U-100 (INSULIN SYRINGE 1CC/31GX5/16") 31G X 5/16" 1 ML MISC USE AS DIRECTED TO INJECT LEVIMIR   lactulose (CHRONULAC) 10 GM/15ML solution TAKE 30 ML BY MOUTH EVERY DAY   LORazepam (ATIVAN) 0.5 MG tablet Take 1 tablet (0.5 mg total) by mouth 2 (two) times daily as needed for anxiety.   MAGNESIUM PO Take 500 mg by  mouth daily.    metoprolol tartrate (LOPRESSOR) 25 MG tablet Take 1 tablet (25 mg total) by mouth as needed (use as needed for HR greater than 110).   Multiple Vitamins-Minerals (MULTIVITAMIN WITH MINERALS) tablet Take 1 tablet by mouth daily.   pantoprazole (PROTONIX) 40 MG tablet TAKE 1 TABLET(40 MG) BY MOUTH DAILY   Polyethyl Glycol-Propyl Glycol (SYSTANE OP) Place 1 drop into both eyes at bedtime.   Probiotic Product (PROBIOTIC DAILY PO) Take 1 capsule by mouth daily. Digestive Advantage Probiotic   XIFAXAN 550  MG TABS tablet Take 1 tablet (550 mg total) by mouth 2 (two) times daily.   No facility-administered encounter medications on file as of 03/24/2022.    Review of Systems:  Review of Systems  Constitutional:  Negative for activity change, chills and fever.  Gastrointestinal:  Negative for abdominal distention, abdominal pain, diarrhea, nausea and vomiting.  Genitourinary:  Positive for dysuria, frequency and urgency. Negative for difficulty urinating and flank pain.    Health Maintenance  Topic Date Due   Zoster Vaccines- Shingrix (1 of 2) Never done   OPHTHALMOLOGY EXAM  08/24/2018   Diabetic kidney evaluation - Urine ACR  06/15/2021   COLONOSCOPY (Pts 45-98yr Insurance coverage will need to be confirmed)  07/07/2021   HEMOGLOBIN A1C  07/07/2022   Medicare Annual Wellness (AWV)  08/11/2022   FOOT EXAM  01/05/2023   Diabetic kidney evaluation - GFR measurement  02/18/2023   Pneumonia Vaccine 75 Years old  Completed   INFLUENZA VACCINE  Completed   DEXA SCAN  Completed   Hepatitis C Screening  Completed   HPV VACCINES  Aged Out   TETANUS/TDAP  Discontinued   COVID-19 Vaccine  Discontinued    Physical Exam: Vitals:   03/24/22 0951  BP: 116/62  Pulse: 62  Temp: (!) 97.1 F (36.2 C)  TempSrc: Temporal  Weight: 124 lb (56.2 kg)  Height: 5' 3"  (1.6 m)   Body mass index is 21.97 kg/m. Physical Exam Constitutional:      Appearance: Normal appearance.  HENT:      Head: Normocephalic and atraumatic.     Nose: Nose normal.     Mouth/Throat:     Mouth: Mucous membranes are moist.  Eyes:     Conjunctiva/sclera: Conjunctivae normal.  Cardiovascular:     Rate and Rhythm: Normal rate and regular rhythm.  Pulmonary:     Effort: Pulmonary effort is normal.     Breath sounds: Normal breath sounds.  Abdominal:     General: Bowel sounds are normal.     Palpations: Abdomen is soft.  Musculoskeletal:        General: Normal range of motion.     Cervical back: Normal range of motion.  Skin:    General: Skin is warm and dry.  Neurological:     General: No focal deficit present.     Mental Status: She is alert and oriented to person, place, and time.  Psychiatric:        Mood and Affect: Mood normal.        Behavior: Behavior normal.        Thought Content: Thought content normal.        Judgment: Judgment normal.     Labs reviewed: Basic Metabolic Panel: Recent Labs    11/08/21 1005 01/26/22 1118 02/17/22 1216  NA 142 139 140  K 3.3* 3.6 4.2  CL 107 105 110  CO2 26 26 26   GLUCOSE 165* 107* 141*  BUN 20 17 11   CREATININE 0.80 0.79 0.74  CALCIUM 9.4 9.3 9.1   Liver Function Tests: Recent Labs    11/08/21 1005 01/26/22 1118 02/17/22 1216  AST 43* 40 49*  ALT 35 27 34  ALKPHOS 93 60 77  BILITOT 1.5* 2.1* 1.9*  PROT 7.0 6.5 6.7  ALBUMIN 3.6 3.2* 3.4*   Recent Labs    05/29/21 1842  LIPASE 28   No results for input(s): "AMMONIA" in the last 8760 hours. CBC: Recent Labs    06/08/21 1140 06/15/21 0904 11/08/21  1005 01/26/22 1118 02/17/22 1216  WBC 4.6   < > 5.4 5.7 4.2  NEUTROABS 2,907  --  4.1 3.8  --   HGB 13.9   < > 14.5 14.5 14.2  HCT 41.5   < > 42.2 43.8 41.9  MCV 94.7   < > 90.2 93.2 92.7  PLT 109*   < > 96* 107* 103*   < > = values in this interval not displayed.   Lipid Panel: Recent Labs    01/04/22 1434  CHOL 174  HDL 89  LDLCALC 68  TRIG 91  CHOLHDL 2.0   Lab Results  Component Value Date    HGBA1C 10.1 (H) 01/04/2022    Procedures since last visit: DG Lumbar Spine 2-3 Views  Result Date: 02/25/2022 CLINICAL DATA:  Elective surgery. L4-L5 laminectomy and foraminotomy, posterior-lateral arthrodesis, extension of hardware. EXAM: LUMBAR SPINE - 2-3 VIEW COMPARISON:  Radiograph 12/07/2021 FINDINGS: Two fluoroscopic spot views of the lumbar spine obtained in the operating room in frontal and lateral projections. New posterior rod and intrapedicular screw fusion at L5. The intrapedicular screws at L3 and L4 are again seen. L3-L4 interbody spacer in place. Fluoroscopy time 36.6 seconds. Dose 18.28 mGy. IMPRESSION: Intraoperative fluoroscopy during lumbar fusion. Electronically Signed   By: Keith Rake M.D.   On: 02/25/2022 13:21   DG C-Arm 1-60 Min-No Report  Result Date: 02/25/2022 Fluoroscopy was utilized by the requesting physician.  No radiographic interpretation.   DG C-Arm 1-60 Min-No Report  Result Date: 02/25/2022 Fluoroscopy was utilized by the requesting physician.  No radiographic interpretation.    Assessment/Plan  1. Acute lower UTI -  will start on Augmentin  - POC Urinalysis Dipstick showed moderate leukocytes, large blood and hazy appearance - Urine Culture - amoxicillin-clavulanate (AUGMENTIN) 875-125 MG tablet; Take 1 tablet by mouth 2 (two) times daily for 7 days.  Dispense: 14 tablet; Refill: 0  2. Primary insomnia - LORazepam (ATIVAN) 0.5 MG tablet; Take 1 tablet (0.5 mg total) by mouth at bedtime as needed for anxiety.  Dispense: 60 tablet; Refill: 1  3. PAF (paroxysmal atrial fibrillation) (HCC) -  rate-controlled, not on anticoagulation due to history of portal vein thrombosis and large varices with prior bleeding  4. Type 2 diabetes mellitus with other circulatory complication, with long-term current use of insulin (HCC) Lab Results  Component Value Date   HGBA1C 10.1 (H) 01/04/2022   -  blood sugar this morning was 130 -  continue Levemir  and Jardiance - Hemoglobin A1C  5. Chronic diastolic heart failure (HCC) -  no SOB -  continue Lasix and Jardiance   Labs/tests ordered:  POC urine dipstick and urine culture and sensitivity  Next appt:  05/18/2022

## 2022-03-24 NOTE — Progress Notes (Incomplete)
[facility] clinic  Provider:  Durenda Age DNP  Code Status:  Full Code  Goals of Care:     03/07/2022   10:56 AM  Advanced Directives  Does Patient Have a Medical Advance Directive? Yes  Type of Paramedic of [place];Living will  Does patient want to make changes to medical advance directive? No - Patient declined  Copy of [place] in Chart? Yes - validated most recent copy scanned in chart (See row information)     Chief Complaint  Patient presents with  . Follow-up    2 week follow-up. Discuss Jardiance dose, patient thinks it is listed wrong. Discuss Lactulose, patient is taking differently then prescribed. Discuss Lorazepam, patient is taking everyday verse as needed, requesting higerg dispense number and change of instructions. Discuss metoprolol, patient is taking differently then prescribed.   . Urinary Tract Infection    Patient c/o possible uti, burning when urinating x 1.5 weeks     HPI: Patient is a 76 y.o. female seen today for management of chronic illnesses. She has a PMH of hypertension, CAD s/p CABG, PVCs A-fib ( not on anticoagulation due to history of portal vein thrombosis and large varices with prior bleeding), CHF, diabetes mellitus, ascites/cirrhosis and bilateral carotid artery disease. She complained today of having dysuria which started 1.5 weeks ago. At first she noted a little bit of blood in the urine and now none. She denies fever nor chills.   She is confused as to how much Jardiance she is supposed to take. Discussed that per medication list, she should be taking Jardiance 25 mg daily.  Wt Readings from Last 3 Encounters:  03/24/22 124 lb (56.2 kg)  03/07/22 125 lb 9.6 oz (57 kg)  02/25/22 126 lb (57.2 kg)     Past Medical History:  Diagnosis Date  . Chronic cystitis   . Chronic diastolic CHF (congestive heart failure) ([place])    followed by dr Johnsie Cancel  . Chronic kidney disease, stage I   .  Cirrhosis of liver without ascites ([place]) 03/2016   followed by dr [name] (GI);   due to fatty liver;   post op cabg 09/ 2017  , post paracentesis for ascites  . Coronary artery disease 01/2016   cardiologist--- dr Frances Nickels;   01-27-2016  NSTEMI  s/p cardiac cath;   s/p CABG 01/29/16: LIMA-LAD, SVG-RI, SVG-dRCA  . DDD (degenerative disc disease), cervical    per pt w/ chronic neck pain  . Dyspnea    occasional  . Edema of right lower extremity   . Esophageal varices (HCC)    followed by dr [name];   hx multiple banding of large varices  . Essential hypertension    no meds  . GERD (gastroesophageal reflux disease)   . Heart murmur     DX FOR YEARS ASYMPTOMATIC  . Hepatic encephalopathy ([place])   . History of adenomatous polyp of colon   . History of COVID-19 04/2020   per pt mild  symptoms that resolve  . History of kidney stones    passed stones  . History of non-ST elevation myocardial infarction (NSTEMI) 01/27/2016  . Hyperlipidemia    diet controlled  . NAFLD (nonalcoholic fatty liver disease)   . Neuropathy, peripheral 04/01/2015  . OA (osteoarthritis)   . Paroxysmal A-fib ([place])    08/19/19 afib with RVR, spontaneously converted to SR  . Portal vein thrombosis    per dr [name]  ,  chronic non-occluded  .  Thrombocytopenia ([place])    followed by pcp  and hematology-- dr Burr Medico;   likely secondary to liver cirrhosis  . Type 2 diabetes mellitus treated with insulin (HCC)    Type 2, followed by pcp    (07-21-2021  per pt checks blood sugar dialy in am fasting average 113--200s)  . Urge incontinence of urine   . Wears hearing aid in both ears     Past Surgical History:  Procedure Laterality Date  . CARDIAC CATHETERIZATION N/A 01/27/2016   Procedure: Left Heart Cath and Coronary Angiography;  Surgeon: Belva Crome, MD;  Location: [place] [facility];  Service: Cardiovascular;  Laterality: N/A;  . COLONOSCOPY WITH PROPOFOL N/A 07/07/2016   Procedure: COLONOSCOPY WITH PROPOFOL;  Surgeon:  [name], MD;  Location: [facility];  Service: Endoscopy;  Laterality: N/A;  . CORONARY ARTERY BYPASS GRAFT N/A 01/28/2016   Procedure: CORONARY ARTERY BYPASS GRAFTING (CABG) x 3 USING RIGHT LEG GREATER SAPHENOUS VEIN GRAFT;  Surgeon: Melrose Nakayama, MD;  Location: [place];  Service: Open Heart Surgery;  Laterality: N/A;  . CYSTOSCOPY WITH RETROGRADE PYELOGRAM, URETEROSCOPY AND STENT PLACEMENT Left 05/30/2021   Procedure: CYSTOSCOPY WITH RETROGRADE PYELOGRAM, URETEROSCOPY AND STENT PLACEMENT;  Surgeon: Ceasar Mons, MD;  Location: [place];  Service: Urology;  Laterality: Left;  . CYSTOSCOPY/URETEROSCOPY/HOLMIUM LASER/STENT PLACEMENT Left 07/26/2021   Procedure: CYSTOSCOPY/RETROGRADE/URETEROSCOPY/HOLMIUM LASER/STENT EXCHANGE;  Surgeon: Janith Lima, MD;  Location[facility];  Service: Urology;  Laterality: Left;  . ENDOVEIN HARVEST OF GREATER SAPHENOUS VEIN Right 01/28/2016   Procedure: ENDOVEIN HARVEST OF GREATER SAPHENOUS VEIN;  Surgeon: Melrose Nakayama, MD;  Location: [place];  Service: Open Heart Surgery;  Laterality: Right;  . ESOPHAGEAL BANDING  03/28/2019   Procedure: ESOPHAGEAL BANDING;  Surgeon: [name], MD;  [facility];  Service: Endoscopy;;  . ESOPHAGEAL BANDING  04/07/2019   Procedure: ESOPHAGEAL BANDING;  Surgeon: Juanita Craver, MD;  Location: [facility] ENDOSCOPY;  Service: Endoscopy;;  . ESOPHAGOGASTRODUODENOSCOPY N/A 04/07/2019   Procedure: ESOPHAGOGASTRODUODENOSCOPY (EGD);  Surgeon: Juanita Craver, MD;  Location: [facility]OSCOPY;  Service: Endoscopy;  Laterality: N/A;  . ESOPHAGOGASTRODUODENOSCOPY (EGD) WITH PROPOFOL N/A 07/07/2016   Procedure: ESOPHAGOGASTRODUODENOSCOPY (EGD) WITH PROPOFOL;  Surgeon: [name], MD;  [facility];  Service: Endoscopy;  Laterality: N/A;  . ESOPHAGOGASTRODUODENOSCOPY (EGD) WITH PROPOFOL N/A 03/28/2019   Procedure: ESOPHAGOGASTRODUODENOSCOPY (EGD) WITH PROPOFOL;  Surgeon: [name], MD;  [facility];  Service: Endoscopy;  Laterality: N/A;  . EXTRACORPOREAL SHOCK WAVE LITHOTRIPSY  2010  . HEMOSTASIS CLIP PLACEMENT  04/07/2019   Procedure: HEMOSTASIS CLIP PLACEMENT;  Surgeon: Juanita Craver, MD;  Location: [place] ENDOSCOPY;  Service: Endoscopy;;  . IR ANGIOGRAM SELECTIVE EACH ADDITIONAL VESSEL  04/08/2019  . IR EMBO ART  VEN HEMORR LYMPH EXTRAV  INC GUIDE ROADMAPPING  04/08/2019  . IR PARACENTESIS  04/08/2019  . IR RADIOLOGIST EVAL & MGMT  06/13/2019  . IR RADIOLOGIST EVAL & MGMT  09/25/2019  . IR RADIOLOGIST EVAL & MGMT  07/07/2020  . IR RADIOLOGIST EVAL & MGMT  07/30/2021  . IR TIPS  04/08/2019  . LAMINECTOMY WITH POSTERIOR LATERAL ARTHRODESIS LEVEL 1 Bilateral 02/25/2022   Procedure: Laminectomy and Foraminotomy - Lumbar four-Lumbar five - bilateral, posterolateral arthrodesis Lumbar four-five, extension of hardware Lumbar four-five;  Surgeon: Eustace Moore, MD;  Location: [place];  Service: Neurosurgery;  Laterality: Bilateral;  . MAXIMUM ACCESS (MAS)POSTERIOR LUMBAR INTERBODY FUSION (PLIF) 1 LEVEL Left 06/10/2015   Procedure: FOR  MAXIMUM ACCESS (MAS) POSTERIOR LUMBAR INTERBODY FUSION (PLIF) LUMBAR THREE-FOUR EXTRAFORAMINAL MICRODISCECTOMY LUMBAR FIVE-SACRAL ONE LEFT;  Surgeon: Eustace Moore, MD;  Location: [place] NEURO ORS;  Service: Neurosurgery;  Laterality: Left;  . RADIOLOGY WITH ANESTHESIA N/A 04/08/2019   Procedure: RADIOLOGY WITH ANESTHESIA;  Surgeon: Radiologist, Medication, MD;  Location: [place];  Service: Radiology;  Laterality: N/A;  . SCLEROTHERAPY  04/07/2019   Procedure: SCLEROTHERAPY;  Surgeon: Juanita Craver, MD;  Location: [facility] ENDOSCOPY;  Service: Endoscopy;;  . TEE WITHOUT CARDIOVERSION N/A 01/28/2016   Procedure: TRANSESOPHAGEAL ECHOCARDIOGRAM (TEE);  Surgeon: Melrose Nakayama, MD;  Location: [place];  Service: Open Heart Surgery;  Laterality: N/A;  . TUBAL LIGATION  1982   Dr Connye Burkitt  . UMBILICAL HERNIA REPAIR N/A 09/10/2019   Procedure:  HERNIA REPAIR UMBILICAL ADULT;  Surgeon: Donnie Mesa, MD;  Location: [place];  Service: General;  Laterality: N/A;  . VAGINAL HYSTERECTOMY  1997   Dr Rande Lawman    Allergies  Allergen Reactions  . Kiwi Extract Anaphylaxis  . Tdap [Tetanus-Diphth-Acell Pertussis] Swelling and Other (See Comments)    Swelling at injection site, gets very hot  . Latex Itching, Dermatitis and Rash  . Statins Other (See Comments)    RHABDOMYOLYSIS  . Tramadol Nausea And Vomiting    Outpatient Encounter Medications as of 03/24/2022  Medication Sig  . acetaminophen (TYLENOL) 650 MG CR tablet Take 1,300 mg by mouth every 8 (eight) hours as needed for pain.  . Biotin 10000 MCG TABS Take 10,000 mcg by mouth daily.  . Calcium Carb-Cholecalciferol (CALCIUM 600 + D PO) Take 1 tablet by mouth in the morning and at bedtime.  . Cholecalciferol (VITAMIN D-3) 25 MCG (1000 UT) CAPS Take 1,000 Units by mouth daily.  . Cyanocobalamin (VITAMIN B 12 PO) Take 1,000 mcg by mouth daily.  . empagliflozin (JARDIANCE) 25 MG TABS tablet Take 1 tablet (25 mg total) by mouth daily.  [name] Kitchen ezetimibe (ZETIA) 10 MG tablet Take 1 tablet (10 mg total) by mouth daily.  . fluticasone (FLONASE) 50 MCG/ACT nasal spray Place 1 spray into both nostrils daily as needed for allergies or rhinitis.  . furosemide (LASIX) 20 MG tablet TAKE 1 TABLET(20 MG) BY MOUTH DAILY  . glucose blood test strip One Touch Ultra Blue test strips. Use to test blood sugar three times daily. Dx: E11.65  . insulin detemir (LEVEMIR FLEXTOUCH) 100 UNIT/ML FlexPen Inject 25 Units into the skin at bedtime.  . Insulin Pen Needle (B-D ULTRAFINE III SHORT PEN) 31G X 8 MM MISC Use to give Insulin injections daily. Dx: E11.65  . Insulin Syringe-Needle U-100 (INSULIN SYRINGE 1CC/31GX5/16") 31G X 5/16" 1 ML MISC USE AS DIRECTED TO INJECT LEVIMIR  . lactulose (CHRONULAC) 10 GM/15ML solution TAKE 30 ML BY MOUTH EVERY DAY  . LORazepam (ATIVAN) 0.5 MG tablet Take 1 tablet (0.5 mg total) by  mouth 2 (two) times daily as needed for anxiety.  [name] Kitchen MAGNESIUM PO Take 500 mg by mouth daily.   . metoprolol tartrate (LOPRESSOR) 25 MG tablet Take 1 tablet (25 mg total) by mouth as needed (use as needed for HR greater than 110).  . Multiple Vitamins-Minerals (MULTIVITAMIN WITH MINERALS) tablet Take 1 tablet by mouth daily.  . pantoprazole (PROTONIX) 40 MG tablet TAKE 1 TABLET(40 MG) BY MOUTH DAILY  . Polyethyl Glycol-Propyl Glycol (SYSTANE OP) Place 1 drop into both eyes at bedtime.  . Probiotic Product (PROBIOTIC DAILY PO) Take 1 capsule by mouth daily. Digestive Advantage Probiotic  . XIFAXAN 550  MG TABS tablet Take 1 tablet (550 mg total) by mouth 2 (two) times daily.   No facility-administered encounter medications on file as of 03/24/2022.    Review of Systems:  Review of Systems  Constitutional:  Negative for activity change, chills and fever.  Gastrointestinal:  Negative for abdominal distention, abdominal pain, diarrhea, nausea and vomiting.  Genitourinary:  Positive for dysuria, frequency and urgency. Negative for difficulty urinating and flank pain.    Health Maintenance  Topic Date Due  . Zoster Vaccines- Shingrix (1 of 2) Never done  . OPHTHALMOLOGY EXAM  08/24/2018  . Diabetic kidney evaluation - Urine ACR  06/15/2021  . COLONOSCOPY (Pts 45-72yr Insurance coverage will need to be confirmed)  07/07/2021  . HEMOGLOBIN A1C  07/07/2022  . Medicare Annual Wellness (AWV)  08/11/2022  . FOOT EXAM  01/05/2023  . Diabetic kidney evaluation - GFR measurement  02/18/2023  . Pneumonia Vaccine 75 Years old  Completed  . INFLUENZA VACCINE  Completed  . DEXA SCAN  Completed  . Hepatitis C Screening  Completed  . HPV VACCINES  Aged Out  . TETANUS/TDAP  Discontinued  . COVID-19 Vaccine  Discontinued    Physical Exam: Vitals:   03/24/22 0951  BP: 116/62  Pulse: 62  Temp: (!) 97.1 F (36.2 C)  TempSrc: Temporal  Weight: 124 lb (56.2 kg)  Height: 5' 3"  (1.6 m)   Body mass  index is 21.97 kg/m. Physical Exam Constitutional:      Appearance: Normal appearance.  HENT:     Head: Normocephalic and atraumatic.     Nose: Nose normal.     Mouth/Throat:     Mouth: Mucous membranes are moist.  Eyes:     Conjunctiva/sclera: Conjunctivae normal.  Cardiovascular:     Rate and Rhythm: Normal rate and regular rhythm.  Pulmonary:     Effort: Pulmonary effort is normal.     Breath sounds: Normal breath sounds.  Abdominal:     General: Bowel sounds are normal.     Palpations: Abdomen is soft.  Musculoskeletal:        General: Normal range of motion.     Cervical back: Normal range of motion.  Skin:    General: Skin is warm and dry.  Neurological:     General: No focal deficit present.     Mental Status: She is alert and oriented to person, place, and time.  Psychiatric:        Mood and Affect: Mood normal.        Behavior: Behavior normal.        Thought Content: Thought content normal.        Judgment: Judgment normal.     Labs reviewed: Basic Metabolic Panel: Recent Labs    11/08/21 1005 01/26/22 1118 02/17/22 1216  NA 142 139 140  K 3.3* 3.6 4.2  CL 107 105 110  CO2 26 26 26   GLUCOSE 165* 107* 141*  BUN 20 17 11   CREATININE 0.80 0.79 0.74  CALCIUM 9.4 9.3 9.1   Liver Function Tests: Recent Labs    11/08/21 1005 01/26/22 1118 02/17/22 1216  AST 43* 40 49*  ALT 35 27 34  ALKPHOS 93 60 77  BILITOT 1.5* 2.1* 1.9*  PROT 7.0 6.5 6.7  ALBUMIN 3.6 3.2* 3.4*   Recent Labs    05/29/21 1842  LIPASE 28   No results for input(s): "AMMONIA" in the last 8760 hours. CBC: Recent Labs    06/08/21 1140 06/15/21 0904 11/08/21  1005 01/26/22 1118 02/17/22 1216  WBC 4.6   < > 5.4 5.7 4.2  NEUTROABS 2,907  --  4.1 3.8  --   HGB 13.9   < > 14.5 14.5 14.2  HCT 41.5   < > 42.2 43.8 41.9  MCV 94.7   < > 90.2 93.2 92.7  PLT 109*   < > 96* 107* 103*   < > = values in this interval not displayed.   Lipid Panel: Recent Labs    01/04/22 1434   CHOL 174  HDL 89  LDLCALC 68  TRIG 91  CHOLHDL 2.0   Lab Results  Component Value Date   HGBA1C 10.1 (H) 01/04/2022    Procedures since last visit: DG Lumbar Spine 2-3 Views  Result Date: 02/25/2022 CLINICAL DATA:  Elective surgery. L4-L5 laminectomy and foraminotomy, posterior-lateral arthrodesis, extension of hardware. EXAM: LUMBAR SPINE - 2-3 VIEW COMPARISON:  Radiograph 12/07/2021 FINDINGS: Two fluoroscopic spot views of the lumbar spine obtained in the operating room in frontal and lateral projections. New posterior rod and intrapedicular screw fusion at L5. The intrapedicular screws at L3 and L4 are again seen. L3-L4 interbody spacer in place. Fluoroscopy time 36.6 seconds. Dose 18.28 mGy. IMPRESSION: Intraoperative fluoroscopy during lumbar fusion. Electronically Signed   By: Keith Rake M.D.   On: 02/25/2022 13:21   DG C-Arm 1-60 Min-No Report  Result Date: 02/25/2022 Fluoroscopy was utilized by the requesting physician.  No radiographic interpretation.   DG C-Arm 1-60 Min-No Report  Result Date: 02/25/2022 Fluoroscopy was utilized by the requesting physician.  No radiographic interpretation.    Assessment/Plan  1. Acute lower UTI -  will start on Augmentin  - POC Urinalysis Dipstick showed moderate leukocytes, large blood and hazy appearance - Urine Culture - amoxicillin-clavulanate (AUGMENTIN) 875-125 MG tablet; Take 1 tablet by mouth 2 (two) times daily for 7 days.  Dispense: 14 tablet; Refill: 0  2. Primary insomnia - LORazepam (ATIVAN) 0.5 MG tablet; Take 1 tablet (0.5 mg total) by mouth at bedtime as needed for anxiety.  Dispense: 60 tablet; Refill: 1  3. PAF (paroxysmal atrial fibrillation) (HCC) -  rate-controlled, not on anticoagulation due to history of portal vein thrombosis and large varices with prior bleeding  4. Type 2 diabetes mellitus with other circulatory complication, with long-term current use of insulin (HCC) Lab Results  Component Value  Date   HGBA1C 10.1 (H) 01/04/2022   -  blood sugar this morning was 130 -  continue Levemir and Jardiance - Hemoglobin A1C  5. Chronic diastolic heart failure (HCC) -  no SOB   Labs/tests ordered:  POC urine dipstick  Next appt:  05/18/2022

## 2022-03-25 LAB — HEMOGLOBIN A1C
Hgb A1c MFr Bld: 9.2 % of total Hgb — ABNORMAL HIGH (ref <5.7)
Mean Plasma Glucose: 217 mg/dL
eAG (mmol/L): 12 mmol/L

## 2022-03-26 LAB — URINE CULTURE
MICRO NUMBER:: 14199734
SPECIMEN QUALITY:: ADEQUATE

## 2022-03-29 ENCOUNTER — Telehealth: Payer: Self-pay | Admitting: Gastroenterology

## 2022-03-29 NOTE — Telephone Encounter (Signed)
Inbound call from patient stating that her patient assistance will be running out in December and needs a renewal for Xifaxan. Please advise.

## 2022-03-30 NOTE — Telephone Encounter (Signed)
PT is calling back to renew Xifaxan. Please advise. Thank you.

## 2022-04-05 ENCOUNTER — Other Ambulatory Visit (HOSPITAL_COMMUNITY): Payer: Self-pay

## 2022-04-06 NOTE — Telephone Encounter (Signed)
Left message on voicemail that patient should continue Xifaxan.  Patient assistance forms completed and signed by Alonza Bogus, PA-C.  Forms then faxed to [facility] with pre auth department.

## 2022-04-06 NOTE — Telephone Encounter (Signed)
Called [name] to inform her I was in process of completing her application and was going to mail the patient portion to her for her to complete and send back to the clinic via mail or drop off. [name] would like to know if Dr [name] would like her to continue taking Xifaxan or should she discontinue. Please call patient and advise if I should proceed with mailing out patient portion.

## 2022-04-12 DIAGNOSIS — M5416 Radiculopathy, lumbar region: Secondary | ICD-10-CM | POA: Diagnosis not present

## 2022-04-15 ENCOUNTER — Ambulatory Visit (HOSPITAL_COMMUNITY): Payer: Medicare Other | Attending: Physician Assistant

## 2022-04-15 DIAGNOSIS — I340 Nonrheumatic mitral (valve) insufficiency: Secondary | ICD-10-CM | POA: Diagnosis not present

## 2022-04-15 LAB — ECHOCARDIOGRAM COMPLETE
AR max vel: 2.04 cm2
AV Area VTI: 2 cm2
AV Area mean vel: 2.06 cm2
AV Mean grad: 10 mmHg
AV Peak grad: 18 mmHg
Ao pk vel: 2.12 m/s
Area-P 1/2: 2.17 cm2
MV VTI: 2.44 cm2
P 1/2 time: 334 msec
S' Lateral: 2.8 cm

## 2022-04-21 ENCOUNTER — Other Ambulatory Visit (HOSPITAL_COMMUNITY): Payer: Self-pay

## 2022-04-22 ENCOUNTER — Ambulatory Visit (INDEPENDENT_AMBULATORY_CARE_PROVIDER_SITE_OTHER): Payer: Medicare Other

## 2022-04-22 DIAGNOSIS — M810 Age-related osteoporosis without current pathological fracture: Secondary | ICD-10-CM

## 2022-04-22 MED ORDER — DENOSUMAB 60 MG/ML ~~LOC~~ SOSY
60.0000 mg | PREFILLED_SYRINGE | Freq: Once | SUBCUTANEOUS | Status: AC
  Administered 2022-04-22: 60 mg via SUBCUTANEOUS

## 2022-04-27 DIAGNOSIS — Z87442 Personal history of urinary calculi: Secondary | ICD-10-CM | POA: Diagnosis not present

## 2022-04-27 DIAGNOSIS — N200 Calculus of kidney: Secondary | ICD-10-CM | POA: Diagnosis not present

## 2022-04-27 DIAGNOSIS — N130 Hydronephrosis with ureteropelvic junction obstruction: Secondary | ICD-10-CM | POA: Diagnosis not present

## 2022-05-05 DIAGNOSIS — N3000 Acute cystitis without hematuria: Secondary | ICD-10-CM | POA: Diagnosis not present

## 2022-05-12 ENCOUNTER — Telehealth: Payer: Self-pay | Admitting: Pharmacy Technician

## 2022-05-12 DIAGNOSIS — Z596 Low income: Secondary | ICD-10-CM

## 2022-05-12 NOTE — Progress Notes (Signed)
[place] [facility])                                            [place] Team    05/12/2022  Yobana Culliton Mar 06, 1946 [id number]  Care coordination call placed to [place] in regard to Levemir application.  Spoke to [place] who informs unfortunately [name] has stopped prescribing assistance for Levemir in 2024.  Will send back to [place] for re evaluation and consultation with provider for a therapeutic interchange with another [name] product if patient is amenable.  Jasten Guyette P. Quaneisha Hanisch, [place]  [phone]

## 2022-05-16 ENCOUNTER — Encounter: Payer: Self-pay | Admitting: Pharmacist

## 2022-05-16 DIAGNOSIS — Z596 Low income: Secondary | ICD-10-CM

## 2022-05-16 NOTE — Progress Notes (Signed)
[facility])                                            [place] Team    05/16/2022  [name] [birth year] [id number]  [facility] Team has been helping the patient obtain Levemir through [place] Patient Assistance Program. Unfortunately, Levemir is no longer available through their program.  Tyler Aas is available through through the program.  If deemed therapeutically appropriate, the patient could be switched to [place] as it is available through [employer].  Plan: Route note to patient's PCP prior to the upcoming appointment.  If patient's therapy is switched, the patient assistance process will be completed for [place].    Elayne Guerin, PharmD, [place] Clinical Pharmacist [phone]

## 2022-05-17 DIAGNOSIS — M5416 Radiculopathy, lumbar region: Secondary | ICD-10-CM | POA: Diagnosis not present

## 2022-05-18 ENCOUNTER — Encounter: Payer: Self-pay | Admitting: Family Medicine

## 2022-05-18 ENCOUNTER — Ambulatory Visit (INDEPENDENT_AMBULATORY_CARE_PROVIDER_SITE_OTHER): Payer: Medicare Other | Admitting: Family Medicine

## 2022-05-18 VITALS — BP 108/68 | HR 64 | Temp 96.8°F | Ht 63.0 in | Wt 128.0 lb

## 2022-05-18 DIAGNOSIS — I480 Paroxysmal atrial fibrillation: Secondary | ICD-10-CM

## 2022-05-18 DIAGNOSIS — K7460 Unspecified cirrhosis of liver: Secondary | ICD-10-CM | POA: Diagnosis not present

## 2022-05-18 DIAGNOSIS — I5032 Chronic diastolic (congestive) heart failure: Secondary | ICD-10-CM | POA: Diagnosis not present

## 2022-05-18 DIAGNOSIS — G8929 Other chronic pain: Secondary | ICD-10-CM

## 2022-05-18 DIAGNOSIS — Z794 Long term (current) use of insulin: Secondary | ICD-10-CM

## 2022-05-18 DIAGNOSIS — M5442 Lumbago with sciatica, left side: Secondary | ICD-10-CM

## 2022-05-18 DIAGNOSIS — R188 Other ascites: Secondary | ICD-10-CM

## 2022-05-18 DIAGNOSIS — E1159 Type 2 diabetes mellitus with other circulatory complications: Secondary | ICD-10-CM

## 2022-05-18 MED ORDER — TRESIBA FLEXTOUCH 100 UNIT/ML ~~LOC~~ SOPN: 25.0000 [IU] | PEN_INJECTOR | Freq: Every day | SUBCUTANEOUS | 2 refills | Status: DC

## 2022-05-18 MED ORDER — INSULIN ASPART 100 UNIT/ML IJ SOLN: 100.0000 [IU] | Freq: Three times a day (TID) | INTRAMUSCULAR | 99 refills | Status: DC

## 2022-05-18 MED ORDER — HYDROCODONE-ACETAMINOPHEN 5-325 MG PO TABS: 1.0000 | ORAL_TABLET | Freq: Four times a day (QID) | ORAL | 0 refills | Status: DC | PRN

## 2022-05-18 NOTE — Progress Notes (Signed)
Provider:  Alain Honey, MD  Careteam: Patient Care Team: [name], MD as PCP - General (Family Medicine) Josue Hector, MD as PCP - Cardiology (Cardiology) Berle Mull, MD as Consulting Physician (Family Medicine) Sharmon Revere as Physician Assistant (Cardiology) Calvert Cantor, MD as Consulting Physician (Ophthalmology) [name], MD as Attending Physician (Gastroenterology) Lavonna Monarch, MD (Inactive) as Consulting Physician (Dermatology)  PLACE OF SERVICE:  [place]  Advanced Directive information    Allergies  Allergen Reactions   Kiwi Extract Anaphylaxis   Tdap [Tetanus-Diphth-Acell Pertussis] Swelling and Other (See Comments)    Swelling at injection site, gets very hot   Latex Itching, Dermatitis and Rash   Statins Other (See Comments)    RHABDOMYOLYSIS   Tramadol Nausea And Vomiting    Chief Complaint  Patient presents with   Medical Management of Chronic Issues    Patient presents today for 4 month follow-up   Quality Metric Gaps    Urine microalbumin, colonoscopy, eye exam,zoster, TDAP     HPI: Patient is a 77 y.o. female patient is here for medical management of chronic problems including diastolic congestive heart failure cirrhosis of the liver, coronary artery disease, generative disc disease, cervical and lumbar, esophageal varices. She had repeat back surgery in October 2023 and continues to have pain.  The midlevel provider at neurosurgeons office gave her meloxicam which is contraindicated with diabetes.  She does have some hydrocodone left from her postop days and also takes Tylenol.  We have to be careful with dose of Tylenol given her liver disease. I received a communication from her insurance company.  She had been getting assistance to pay for Levemir but that is being discontinued so the request is to change her over to [place].  Previously she had been on multiple injections of rapid acting insulin and said she  would not mind going back to that so my thought is to use the long-acting as well as short acting before meals to see if we can get her A1c into a more acceptable range.  Review of Systems:  Review of Systems  Constitutional:  Positive for malaise/fatigue.  HENT: Negative.    Eyes: Negative.   Respiratory: Negative.    Cardiovascular:  Positive for leg swelling.  Genitourinary:  Positive for dysuria.  Musculoskeletal:  Positive for back pain.  Skin: Negative.   Neurological: Negative.     Past Medical History:  Diagnosis Date   Chronic cystitis    Chronic diastolic CHF (congestive heart failure) ([employer])    followed by dr Johnsie Cancel   Chronic kidney disease, stage I    Cirrhosis of liver without ascites ([place]) 03/2016   followed by dr [name] (GI);   due to fatty liver;   post op cabg 09/ 2017  , post paracentesis for ascites   Coronary artery disease 01/2016   cardiologist--- dr Frances Nickels;   01-27-2016  NSTEMI  s/p cardiac cath;   s/p CABG 01/29/16: LIMA-LAD, SVG-RI, SVG-dRCA   DDD (degenerative disc disease), cervical    per pt w/ chronic neck pain   Dyspnea    occasional   Edema of right lower extremity    Esophageal varices ([place])    followed by dr [name];   hx multiple banding of large varices   Essential hypertension    no meds   GERD (gastroesophageal reflux disease)    Heart murmur     DX FOR YEARS ASYMPTOMATIC   Hepatic encephalopathy ([place])  History of adenomatous polyp of colon    History of COVID-19 04/2020   per pt mild  symptoms that resolve   History of kidney stones    passed stones   History of non-ST elevation myocardial infarction (NSTEMI) 01/27/2016   Hyperlipidemia    diet controlled   NAFLD (nonalcoholic fatty liver disease)    Neuropathy, peripheral 04/01/2015   OA (osteoarthritis)    Paroxysmal A-fib ([place])    08/19/19 afib with RVR, spontaneously converted to SR   Portal vein thrombosis    per dr [name]  ,  chronic non-occluded   Thrombocytopenia ([place])     followed by pcp  and hematology-- dr Burr Medico;   likely secondary to liver cirrhosis   Type 2 diabetes mellitus treated with insulin ([place])    Type 2, followed by pcp    (07-21-2021  per pt checks blood sugar dialy in am fasting average 113--200s)   Urge incontinence of urine    Wears hearing aid in both ears    Past Surgical History:  Procedure Laterality Date   CARDIAC CATHETERIZATION N/A 01/27/2016   Procedure: Left Heart Cath and Coronary Angiography;  Surgeon: Belva Crome, MD;  Location: [place] [facility];  Service: Cardiovascular;  Laterality: N/A;   COLONOSCOPY WITH PROPOFOL N/A 07/07/2016   Procedure: COLONOSCOPY WITH PROPOFOL;  Surgeon: [name], MD;  Location: [facility];  Service: Endoscopy;  Laterality: N/A;   CORONARY ARTERY BYPASS GRAFT N/A 01/28/2016   Procedure: CORONARY ARTERY BYPASS GRAFTING (CABG) x 3 USING RIGHT LEG GREATER SAPHENOUS VEIN GRAFT;  Surgeon: Melrose Nakayama, MD;  Location: [place] [facility];  Service: Open Heart Surgery;  Laterality: N/A;   CYSTOSCOPY WITH RETROGRADE PYELOGRAM, URETEROSCOPY AND STENT PLACEMENT Left 05/30/2021   Procedure: CYSTOSCOPY WITH RETROGRADE PYELOGRAM, URETEROSCOPY AND STENT PLACEMENT;  Surgeon: Ceasar Mons, MD;  Location: [place];  Service: Urology;  Laterality: Left;   CYSTOSCOPY/URETEROSCOPY/HOLMIUM LASER/STENT PLACEMENT Left 07/26/2021   Procedure: CYSTOSCOPY/RETROGRADE/URETEROSCOPY/HOLMIUM LASER/STENT EXCHANGE;  Surgeon: Janith Lima, MD;  Location: [facility];  Service: Urology;  Laterality: Left;   ENDOVEIN HARVEST OF GREATER SAPHENOUS VEIN Right 01/28/2016   Procedure: ENDOVEIN HARVEST OF GREATER SAPHENOUS VEIN;  Surgeon: Melrose Nakayama, MD;  Location: [place];  Service: Open Heart Surgery;  Laterality: Right;   ESOPHAGEAL BANDING  03/28/2019   Procedure: ESOPHAGEAL BANDING;  Surgeon: [name], MD;  Location: [facility];  Service: Endoscopy;;   ESOPHAGEAL BANDING  04/07/2019    Procedure: ESOPHAGEAL BANDING;  Surgeon: Juanita Craver, MD;  Location[facility];  Service: Endoscopy;;   ESOPHAGOGASTRODUODENOSCOPY N/A 04/07/2019   Procedure: ESOPHAGOGASTRODUODENOSCOPY (EGD);  Surgeon: Juanita Craver, MD;  Location: [facility] ENDOSCOPY;  Service: Endoscopy;  Laterality: N/A;   ESOPHAGOGASTRODUODENOSCOPY (EGD) WITH PROPOFOL N/A 07/07/2016   Procedure: ESOPHAGOGASTRODUODENOSCOPY (EGD) WITH PROPOFOL;  Surgeon: [name], MD;  Location: [facility];  Service: Endoscopy;  Laterality: N/A;   ESOPHAGOGASTRODUODENOSCOPY (EGD) WITH PROPOFOL N/A 03/28/2019   Procedure: ESOPHAGOGASTRODUODENOSCOPY (EGD) WITH PROPOFOL;  Surgeon: [name], MD;  Location: [facility];  Service: Endoscopy;  Laterality: N/A;   EXTRACORPOREAL SHOCK WAVE LITHOTRIPSY  2010   HEMOSTASIS CLIP PLACEMENT  04/07/2019   Procedure: HEMOSTASIS CLIP PLACEMENT;  Surgeon: Juanita Craver, MD;  Location: [place];  Service: Endoscopy;;   IR ANGIOGRAM SELECTIVE EACH ADDITIONAL VESSEL  04/08/2019   IR EMBO ART  VEN HEMORR LYMPH EXTRAV  INC GUIDE ROADMAPPING  04/08/2019   IR PARACENTESIS  04/08/2019   IR RADIOLOGIST EVAL &  MGMT  06/13/2019   IR RADIOLOGIST EVAL & MGMT  09/25/2019   IR RADIOLOGIST EVAL & MGMT  07/07/2020   IR RADIOLOGIST EVAL & MGMT  07/30/2021   IR TIPS  04/08/2019   LAMINECTOMY WITH POSTERIOR LATERAL ARTHRODESIS LEVEL 1 Bilateral 02/25/2022   Procedure: Laminectomy and Foraminotomy - Lumbar four-Lumbar five - bilateral, posterolateral arthrodesis Lumbar four-five, extension of hardware Lumbar four-five;  Surgeon: Eustace Moore, MD;  Location: [place];  Service: Neurosurgery;  Laterality: Bilateral;   MAXIMUM ACCESS (MAS)POSTERIOR LUMBAR INTERBODY FUSION (PLIF) 1 LEVEL Left 06/10/2015   Procedure: FOR MAXIMUM ACCESS (MAS) POSTERIOR LUMBAR INTERBODY FUSION (PLIF) LUMBAR THREE-FOUR EXTRAFORAMINAL MICRODISCECTOMY LUMBAR FIVE-SACRAL ONE LEFT;  Surgeon: Eustace Moore, MD;  Location: [place] [facility];  Service:  Neurosurgery;  Laterality: Left;   RADIOLOGY WITH ANESTHESIA N/A 04/08/2019   Procedure: RADIOLOGY WITH ANESTHESIA;  Surgeon: Radiologist, Medication, MD;  Location: [place];  Service: Radiology;  Laterality: N/A;   SCLEROTHERAPY  04/07/2019   Procedure: SCLEROTHERAPY;  Surgeon: Juanita Craver, MD;  Location: [facility] ENDOSCOPY;  Service: Endoscopy;;   TEE WITHOUT CARDIOVERSION N/A 01/28/2016   Procedure: TRANSESOPHAGEAL ECHOCARDIOGRAM (TEE);  Surgeon: Melrose Nakayama, MD;  Location: [place];  Service: Open Heart Surgery;  Laterality: N/A;   TUBAL LIGATION  1982   Dr Lynwood Dawley HERNIA REPAIR N/A 09/10/2019   Procedure: HERNIA REPAIR UMBILICAL ADULT;  Surgeon: Donnie Mesa, MD;  Location: [place];  Service: General;  Laterality: N/A;   VAGINAL HYSTERECTOMY  1997   Dr Rande Lawman   Social History:   reports that she has never smoked. She has never used smokeless tobacco. She reports that she does not drink alcohol and does not use drugs.  Family History  Problem Relation Age of Onset   Heart disease Mother    Breast cancer Mother    Lung cancer Father    Arthritis Sister    Arthritis Brother    Liver cancer Brother    Lymphoma Brother    Heart disease Maternal Grandmother    Heart disease Maternal Grandfather    Heart disease Paternal Grandmother    Heart disease Paternal Grandfather    Other Daughter        house fire   Breast cancer Maternal Aunt    Breast cancer Paternal Aunt    Colon cancer Neg Hx    Esophageal cancer Neg Hx    Rectal cancer Neg Hx    Stomach cancer Neg Hx     Medications: Patient's Medications  New Prescriptions   HYDROCODONE-ACETAMINOPHEN (NORCO) 5-325 MG TABLET    Take 1 tablet by mouth every 6 (six) hours as needed for moderate pain.   INSULIN ASPART (NOVOLOG) 100 UNIT/ML INJECTION    Inject 100 Units into the skin 3 (three) times daily before meals. Take 4 units before breakfast and 6 uints before lunch/supper   INSULIN DEGLUDEC (TRESIBA FLEXTOUCH) 100  UNIT/ML FLEXTOUCH PEN    Inject 25 Units into the skin at bedtime.  Previous Medications   BIOTIN 76720 MCG TABS    Take 10,000 mcg by mouth daily.   CALCIUM CARB-CHOLECALCIFEROL (CALCIUM 600 + D PO)    Take 1 tablet by mouth in the morning and at bedtime.   CHOLECALCIFEROL (VITAMIN D-3) 25 MCG (1000 UT) CAPS    Take 1,000 Units by mouth daily.   CYANOCOBALAMIN (VITAMIN B 12 PO)    Take 1,000 mcg by mouth daily.   EMPAGLIFLOZIN (JARDIANCE) 25 MG TABS TABLET  Take 1 tablet (25 mg total) by mouth daily.   EZETIMIBE (ZETIA) 10 MG TABLET    Take 1 tablet (10 mg total) by mouth daily.   FLUTICASONE (FLONASE) 50 MCG/ACT NASAL SPRAY    Place 1 spray into both nostrils daily as needed for allergies or rhinitis.   FUROSEMIDE (LASIX) 20 MG TABLET    TAKE 1 TABLET(20 MG) BY MOUTH DAILY   GLUCOSE BLOOD TEST STRIP    One Touch Ultra Blue test strips. Use to test blood sugar three times daily. Dx: E11.65   INSULIN DETEMIR (LEVEMIR FLEXTOUCH) 100 UNIT/ML FLEXPEN    Inject 25 Units into the skin at bedtime.   INSULIN PEN NEEDLE (B-D ULTRAFINE III SHORT PEN) 31G X 8 MM MISC    Use to give Insulin injections daily. Dx: E11.65   INSULIN SYRINGE-NEEDLE U-100 (INSULIN SYRINGE 1CC/31GX5/16") 31G X 5/16" 1 ML MISC    USE AS DIRECTED TO INJECT LEVIMIR   LACTULOSE (CHRONULAC) 10 GM/15ML SOLUTION    TAKE 30 ML BY MOUTH EVERY DAY   LORAZEPAM (ATIVAN) 0.5 MG TABLET    Take 1 tablet (0.5 mg total) by mouth at bedtime as needed for anxiety.   MAGNESIUM PO    Take 500 mg by mouth daily.    METOPROLOL TARTRATE (LOPRESSOR) 25 MG TABLET    Take 1 tablet (25 mg total) by mouth as needed (use as needed for HR greater than 110).   MULTIPLE VITAMINS-MINERALS (MULTIVITAMIN WITH MINERALS) TABLET    Take 1 tablet by mouth daily.   PANTOPRAZOLE (PROTONIX) 40 MG TABLET    TAKE 1 TABLET(40 MG) BY MOUTH DAILY   POLYETHYL GLYCOL-PROPYL GLYCOL (SYSTANE OP)    Place 1 drop into both eyes at bedtime.   PROBIOTIC PRODUCT (PROBIOTIC DAILY PO)     Take 1 capsule by mouth daily. Digestive Advantage Probiotic   XIFAXAN 550 MG TABS TABLET    Take 1 tablet (550 mg total) by mouth 2 (two) times daily.  Modified Medications   No medications on file  Discontinued Medications   ACETAMINOPHEN (TYLENOL) 650 MG CR TABLET    Take 1,300 mg by mouth every 8 (eight) hours as needed for pain.    Physical Exam:  Vitals:   05/18/22 1303  BP: 108/68  Pulse: 64  Temp: (!) 96.8 F (36 C)  SpO2: 98%  Weight: 128 lb (58.1 kg)  Height: '5\' 3"'$  (1.6 m)   Body mass index is 22.67 kg/m. Wt Readings from Last 3 Encounters:  05/18/22 128 lb (58.1 kg)  03/24/22 124 lb (56.2 kg)  03/07/22 125 lb 9.6 oz (57 kg)    Physical Exam Vitals and nursing note reviewed.  Constitutional:      Appearance: Normal appearance.  HENT:     Head: Normocephalic.  Cardiovascular:     Rate and Rhythm: Normal rate.     Heart sounds: Murmur heard.  Pulmonary:     Effort: Pulmonary effort is normal.     Breath sounds: Normal breath sounds.  Abdominal:     General: Bowel sounds are normal.     Palpations: Abdomen is soft.  Skin:    General: Skin is warm and dry.  Neurological:     General: No focal deficit present.     Mental Status: She is alert and oriented to person, place, and time.  Psychiatric:        Mood and Affect: Mood normal.     Labs reviewed: Basic Metabolic Panel: Recent Labs  11/08/21 1005 01/26/22 1118 02/17/22 1216  NA 142 139 140  K 3.3* 3.6 4.2  CL 107 105 110  CO2 '26 26 26  '$ GLUCOSE 165* 107* 141*  BUN '20 17 11  '$ CREATININE 0.80 0.79 0.74  CALCIUM 9.4 9.3 9.1   Liver Function Tests: Recent Labs    11/08/21 1005 01/26/22 1118 02/17/22 1216  AST 43* 40 49*  ALT 35 27 34  ALKPHOS 93 60 77  BILITOT 1.5* 2.1* 1.9*  PROT 7.0 6.5 6.7  ALBUMIN 3.6 3.2* 3.4*   Recent Labs    05/29/21 1842  LIPASE 28   No results for input(s): "AMMONIA" in the last 8760 hours. CBC: Recent Labs    06/08/21 1140 06/15/21 0904  11/08/21 1005 01/26/22 1118 02/17/22 1216  WBC 4.6   < > 5.4 5.7 4.2  NEUTROABS 2,907  --  4.1 3.8  --   HGB 13.9   < > 14.5 14.5 14.2  HCT 41.5   < > 42.2 43.8 41.9  MCV 94.7   < > 90.2 93.2 92.7  PLT 109*   < > 96* 107* 103*   < > = values in this interval not displayed.   Lipid Panel: Recent Labs    01/04/22 1434  CHOL 174  HDL 89  LDLCALC 68  TRIG 91  CHOLHDL 2.0   TSH: No results for input(s): "TSH" in the last 8760 hours. A1C: Lab Results  Component Value Date   HGBA1C 9.2 (H) 03/24/2022     Assessment/Plan  1. Chronic bilateral low back pain with left-sided sciatica Followed by neurosurgery.  Stop meloxicam.  I did refill hydrocodone to take as needed for the pain along with Tylenol.  I do not feel like there is abuse potential here  2. Paroxysmal atrial fibrillation [facility]) Patient continues with Lopressor.  Not a candidate for anticoagulant with her varices  3. Chronic diastolic congestive heart failure ([place]) Continues with Lasix and Jardiance  4. Cirrhosis of liver with ascites, unspecified hepatic cirrhosis type ([place]) Most liver enzymes are in the normal range with some mild elevation of AST on last check  5. Type 2 diabetes mellitus with other circulatory complication, with long-term current use of insulin ([facility]) Diabetes has not been well-controlled.  Last A1c was 9 and in discussion above will institute program of long-acting and short acting insulin along with more adherence to diet especially sweets.  Will check her back in 3 months and repeat A1c then   Alain Honey, MD [place] [phone]

## 2022-05-19 ENCOUNTER — Telehealth: Payer: Self-pay | Admitting: *Deleted

## 2022-05-19 NOTE — Telephone Encounter (Signed)
Patient called and stated that she was seen yesterday in office by Dr. Sabra Heck and her Insulin was changed from Levemir to [place] and she was just calling to see when she should take it.   Dr. Sabra Heck stated in the Rx to inject at bedtime.   Patient notified and agreed.

## 2022-05-27 ENCOUNTER — Emergency Department (HOSPITAL_COMMUNITY)
Admission: EM | Admit: 2022-05-27 | Discharge: 2022-05-28 | Disposition: A | Discharge status: Home / Self Care | Payer: Medicare Other | Attending: Emergency Medicine | Admitting: Emergency Medicine

## 2022-05-27 ENCOUNTER — Encounter (HOSPITAL_COMMUNITY): Payer: Self-pay

## 2022-05-27 ENCOUNTER — Emergency Department (HOSPITAL_COMMUNITY): Payer: Medicare Other

## 2022-05-27 ENCOUNTER — Telehealth: Payer: Self-pay | Admitting: Pharmacy Technician

## 2022-05-27 ENCOUNTER — Other Ambulatory Visit: Payer: Self-pay

## 2022-05-27 DIAGNOSIS — Z1152 Encounter for screening for COVID-19: Secondary | ICD-10-CM | POA: Diagnosis not present

## 2022-05-27 DIAGNOSIS — W1830XA Fall on same level, unspecified, initial encounter: Secondary | ICD-10-CM

## 2022-05-27 DIAGNOSIS — W1839XA Other fall on same level, initial encounter: Secondary | ICD-10-CM | POA: Diagnosis not present

## 2022-05-27 DIAGNOSIS — M25552 Pain in left hip: Secondary | ICD-10-CM | POA: Insufficient documentation

## 2022-05-27 DIAGNOSIS — K7581 Nonalcoholic steatohepatitis (NASH): Secondary | ICD-10-CM | POA: Diagnosis not present

## 2022-05-27 DIAGNOSIS — B974 Respiratory syncytial virus as the cause of diseases classified elsewhere: Secondary | ICD-10-CM | POA: Insufficient documentation

## 2022-05-27 DIAGNOSIS — R1084 Generalized abdominal pain: Secondary | ICD-10-CM | POA: Diagnosis not present

## 2022-05-27 DIAGNOSIS — R600 Localized edema: Secondary | ICD-10-CM | POA: Diagnosis not present

## 2022-05-27 DIAGNOSIS — T1490XA Injury, unspecified, initial encounter: Secondary | ICD-10-CM | POA: Diagnosis not present

## 2022-05-27 DIAGNOSIS — R161 Splenomegaly, not elsewhere classified: Secondary | ICD-10-CM | POA: Diagnosis not present

## 2022-05-27 DIAGNOSIS — K7460 Unspecified cirrhosis of liver: Secondary | ICD-10-CM | POA: Insufficient documentation

## 2022-05-27 DIAGNOSIS — Z91040 Latex allergy status: Secondary | ICD-10-CM | POA: Diagnosis not present

## 2022-05-27 DIAGNOSIS — I6782 Cerebral ischemia: Secondary | ICD-10-CM | POA: Insufficient documentation

## 2022-05-27 DIAGNOSIS — R5383 Other fatigue: Secondary | ICD-10-CM | POA: Diagnosis not present

## 2022-05-27 DIAGNOSIS — Z96643 Presence of artificial hip joint, bilateral: Secondary | ICD-10-CM | POA: Diagnosis not present

## 2022-05-27 DIAGNOSIS — R531 Weakness: Secondary | ICD-10-CM | POA: Diagnosis not present

## 2022-05-27 DIAGNOSIS — Z794 Long term (current) use of insulin: Secondary | ICD-10-CM | POA: Diagnosis not present

## 2022-05-27 DIAGNOSIS — B338 Other specified viral diseases: Secondary | ICD-10-CM

## 2022-05-27 DIAGNOSIS — I4891 Unspecified atrial fibrillation: Secondary | ICD-10-CM | POA: Insufficient documentation

## 2022-05-27 DIAGNOSIS — R109 Unspecified abdominal pain: Secondary | ICD-10-CM | POA: Diagnosis not present

## 2022-05-27 DIAGNOSIS — Z043 Encounter for examination and observation following other accident: Secondary | ICD-10-CM | POA: Diagnosis not present

## 2022-05-27 DIAGNOSIS — I700 Atherosclerosis of aorta: Secondary | ICD-10-CM | POA: Insufficient documentation

## 2022-05-27 DIAGNOSIS — M545 Low back pain, unspecified: Secondary | ICD-10-CM | POA: Diagnosis not present

## 2022-05-27 DIAGNOSIS — I509 Heart failure, unspecified: Secondary | ICD-10-CM | POA: Diagnosis not present

## 2022-05-27 DIAGNOSIS — D72829 Elevated white blood cell count, unspecified: Secondary | ICD-10-CM | POA: Insufficient documentation

## 2022-05-27 DIAGNOSIS — M8588 Other specified disorders of bone density and structure, other site: Secondary | ICD-10-CM | POA: Diagnosis not present

## 2022-05-27 DIAGNOSIS — Z596 Low income: Secondary | ICD-10-CM

## 2022-05-27 DIAGNOSIS — M4316 Spondylolisthesis, lumbar region: Secondary | ICD-10-CM | POA: Diagnosis not present

## 2022-05-27 LAB — COMPREHENSIVE METABOLIC PANEL
ALT: 24 U/L (ref 0–44)
AST: 47 U/L — ABNORMAL HIGH (ref 15–41)
Albumin: 3.2 g/dL — ABNORMAL LOW (ref 3.5–5.0)
Alkaline Phosphatase: 74 U/L (ref 38–126)
Anion gap: 12 (ref 5–15)
BUN: 22 mg/dL (ref 8–23)
CO2: 20 mmol/L — ABNORMAL LOW (ref 22–32)
Calcium: 9.5 mg/dL (ref 8.9–10.3)
Chloride: 101 mmol/L (ref 98–111)
Creatinine, Ser: 0.85 mg/dL (ref 0.44–1.00)
GFR, Estimated: >60 mL/min (ref >60)
Glucose, Bld: 158 mg/dL — ABNORMAL HIGH (ref 70–99)
Potassium: 3.9 mmol/L (ref 3.5–5.1)
Sodium: 133 mmol/L — ABNORMAL LOW (ref 135–145)
Total Bilirubin: 2.8 mg/dL — ABNORMAL HIGH (ref 0.3–1.2)
Total Protein: 6.9 g/dL (ref 6.5–8.1)

## 2022-05-27 LAB — CBC WITH DIFFERENTIAL/PLATELET
Abs Immature Granulocytes: 0.09 10*3/uL — ABNORMAL HIGH (ref 0.00–0.07)
Basophils Absolute: 0.1 10*3/uL (ref 0.0–0.1)
Basophils Relative: 0 %
Eosinophils Absolute: 0 10*3/uL (ref 0.0–0.5)
Eosinophils Relative: 0 %
HCT: 42.6 % (ref 36.0–46.0)
Hemoglobin: 13.8 g/dL (ref 12.0–15.0)
Immature Granulocytes: 1 %
Lymphocytes Relative: 5 %
Lymphs Abs: 0.6 10*3/uL — ABNORMAL LOW (ref 0.7–4.0)
MCH: 30.6 pg (ref 26.0–34.0)
MCHC: 32.4 g/dL (ref 30.0–36.0)
MCV: 94.5 fL (ref 80.0–100.0)
Monocytes Absolute: 1.6 10*3/uL — ABNORMAL HIGH (ref 0.1–1.0)
Monocytes Relative: 13 %
Neutro Abs: 10.5 10*3/uL — ABNORMAL HIGH (ref 1.7–7.7)
Neutrophils Relative %: 81 %
Platelets: 82 10*3/uL — ABNORMAL LOW (ref 150–400)
RBC: 4.51 MIL/uL (ref 3.87–5.11)
RDW: 13.8 % (ref 11.5–15.5)
WBC: 12.9 10*3/uL — ABNORMAL HIGH (ref 4.0–10.5)
nRBC: 0 % (ref 0.0–0.2)

## 2022-05-27 LAB — RESP PANEL BY RT-PCR (RSV, FLU A&B, COVID)  RVPGX2
Influenza A by PCR: NEGATIVE
Influenza B by PCR: NEGATIVE
Resp Syncytial Virus by PCR: POSITIVE — AB
SARS Coronavirus 2 by RT PCR: NEGATIVE

## 2022-05-27 LAB — CK: Total CK: 488 U/L — ABNORMAL HIGH (ref 38–234)

## 2022-05-27 LAB — TROPONIN I (HIGH SENSITIVITY): Troponin I (High Sensitivity): 44 ng/L — ABNORMAL HIGH (ref <18)

## 2022-05-27 LAB — BRAIN NATRIURETIC PEPTIDE: B Natriuretic Peptide: 493.2 pg/mL — ABNORMAL HIGH (ref 0.0–100.0)

## 2022-05-27 LAB — TSH: TSH: 2.468 u[IU]/mL (ref 0.350–4.500)

## 2022-05-27 LAB — T4, FREE: Free T4: 1.06 ng/dL (ref 0.61–1.12)

## 2022-05-27 MED ORDER — FENTANYL CITRATE PF 50 MCG/ML IJ SOSY
25.0000 ug | PREFILLED_SYRINGE | Freq: Once | INTRAMUSCULAR | Status: AC
  Administered 2022-05-27: 25 ug via INTRAVENOUS
  Filled 2022-05-27: qty 1

## 2022-05-27 NOTE — ED Triage Notes (Signed)
Pt came in via POV d/t falling on her hardwood floor yesterday. Landing on her Lt side, denies hitting her head. A.Ox4, denies pain while in triage. Pt endorses having difficulty walking ever since the fall, but is able to ambulate.

## 2022-05-27 NOTE — ED Provider Notes (Signed)
[facility] Provider Note   CSN: [id number] Arrival date & time: 05/27/22  1337     History  No chief complaint on file.   [name] is a 77 y.o. female.PMH NASH cirrhosis, CHF (EF 60-65%), atrial fibrillation not on blood thinners presenting to the ED after ground level fall. Patient states she was ambulating to the bathroom with difficulty 2/2 to chronic lower back pain and bilateral lower extremity hip replacements.  On further review of history per patient's daughter patient has been lethargic x 2 days with poor p.o. intake she states that she has been having normal urine output she denies diarrhea.  She states that she is felt like she wants to vomit but has not actually had any episodes of vomiting.  Denies chest pain or shortness of breath.  She denies dyspnea on exertion she states that she has been compliant with all her medications.  HPI     Home Medications Prior to Admission medications   Medication Sig Start Date End Date Taking? Authorizing Provider  Biotin 10000 MCG TABS Take 10,000 mcg by mouth daily.    [provider]  Calcium Carb-Cholecalciferol (CALCIUM 600 + D PO) Take 1 tablet by mouth in the morning and at bedtime.    [provider]  Cholecalciferol (VITAMIN D-3) 25 MCG (1000 UT) CAPS Take 1,000 Units by mouth daily.    [provider]  Cyanocobalamin (VITAMIN B 12 PO) Take 1,000 mcg by mouth daily.    [provider]  empagliflozin (JARDIANCE) 25 MG TABS tablet Take 1 tablet (25 mg total) by mouth daily. 03/14/22   [name], MD  ezetimibe (ZETIA) 10 MG tablet Take 1 tablet (10 mg total) by mouth daily. 02/09/22   [name], MD  fluticasone (FLONASE) 50 MCG/ACT nasal spray Place 1 spray into both nostrils daily as needed for allergies or rhinitis.    [provider]  furosemide (LASIX) 20 MG tablet TAKE 1 TABLET(20 MG) BY MOUTH DAILY 03/16/22    Josue Hector, MD  glucose blood test strip One Touch Ultra Blue test strips. Use to test blood sugar three times daily. Dx: E11.65 02/09/22   [name], MD  HYDROcodone-acetaminophen (NORCO) 5-325 MG tablet Take 1 tablet by mouth every 6 (six) hours as needed for moderate pain. 05/18/22   [name], MD  insulin aspart (NOVOLOG) 100 UNIT/ML injection Inject 100 Units into the skin 3 (three) times daily before meals. Take 4 units before breakfast and 6 uints before lunch/supper 05/18/22   [name], MD  insulin degludec (TRESIBA FLEXTOUCH) 100 UNIT/ML FlexTouch Pen Inject 25 Units into the skin at bedtime. 05/18/22   [name], MD  Insulin Pen Needle (B-D ULTRAFINE III SHORT PEN) 31G X 8 MM MISC Use to give Insulin injections daily. Dx: E11.65 10/11/21   [name], MD  Insulin Syringe-Needle U-100 (INSULIN SYRINGE 1CC/31GX5/16") 31G X 5/16" 1 ML MISC USE AS DIRECTED TO INJECT LEVIMIR 07/29/20   [name], MD  lactulose [facility]) 10 GM/15ML solution TAKE 30 ML BY MOUTH EVERY DAY 11/26/21   [name], MD  LORazepam (ATIVAN) 0.5 MG tablet Take 1 tablet (0.5 mg total) by mouth at bedtime as needed for anxiety. 03/24/22   Medina-Vargas, Monina C, NP  MAGNESIUM PO Take 500 mg by mouth daily.     [provider]  metoprolol tartrate (LOPRESSOR) 25 MG tablet Take 1  tablet (25 mg total) by mouth as needed (use as needed for HR greater than 110). 08/21/19   Tommie Raymond, NP  Multiple Vitamins-Minerals (MULTIVITAMIN WITH MINERALS) tablet Take 1 tablet by mouth daily.    [provider]  pantoprazole (PROTONIX) 40 MG tablet TAKE 1 TABLET(40 MG) BY MOUTH DAILY 10/25/21   [name], MD  Polyethyl Glycol-Propyl Glycol (SYSTANE OP) Place 1 drop into both eyes at bedtime.    [provider]  Probiotic Product (PROBIOTIC DAILY PO) Take 1 capsule by mouth daily. Digestive Advantage Probiotic    [provider]  XIFAXAN 550 MG  TABS tablet Take 1 tablet (550 mg total) by mouth 2 (two) times daily. 06/30/21   [name], MD      Allergies    Kiwi extract, Tdap [tetanus-diphth-acell pertussis], Latex, Statins, and Tramadol    Review of Systems   Review of Systems  Physical Exam Updated Vital Signs BP (!) 126/53 (BP Location: Left Arm)   Pulse 76   Temp 98.5 F (36.9 C) (Oral)   Resp 16   LMP  (LMP Unknown)   SpO2 98%  Physical Exam Vitals and nursing note reviewed.  Constitutional:      General: She is not in acute distress.    Appearance: She is well-developed.  HENT:     Head: Normocephalic and atraumatic.     Mouth/Throat:     Mouth: Mucous membranes are dry.  Eyes:     Conjunctiva/sclera: Conjunctivae normal.  Cardiovascular:     Rate and Rhythm: Normal rate and regular rhythm.     Heart sounds: No murmur heard. Pulmonary:     Effort: Pulmonary effort is normal. No respiratory distress.     Breath sounds: No wheezing or rales.  Abdominal:     General: Abdomen is flat. There is no distension.     Palpations: Abdomen is soft.     Tenderness: There is generalized abdominal tenderness.  Musculoskeletal:        General: Tenderness present. No swelling or deformity.     Cervical back: Neck supple.     Right lower leg: Edema present.     Left lower leg: Edema present.  Skin:    General: Skin is warm and dry.     Capillary Refill: Capillary refill takes less than 2 seconds.  Neurological:     Mental Status: She is alert.  Psychiatric:        Mood and Affect: Mood normal.     ED Results / Procedures / Treatments   Labs (all labs ordered are listed, but only abnormal results are displayed) Labs Reviewed  RESP PANEL BY RT-PCR (RSV, FLU A&B, COVID)  RVPGX2 - Abnormal; Notable for the following components:      Result Value   Resp Syncytial Virus by PCR POSITIVE (*)    All other components within normal limits  CBC WITH DIFFERENTIAL/PLATELET - Abnormal; Notable for the following  components:   WBC 12.9 (*)    Platelets 82 (*)    Neutro Abs 10.5 (*)    Lymphs Abs 0.6 (*)    Monocytes Absolute 1.6 (*)    Abs Immature Granulocytes 0.09 (*)    All other components within normal limits  COMPREHENSIVE METABOLIC PANEL - Abnormal; Notable for the following components:   Sodium 133 (*)    CO2 20 (*)    Glucose, Bld 158 (*)    Albumin 3.2 (*)    AST 47 (*)  Total Bilirubin 2.8 (*)    All other components within normal limits  CK - Abnormal; Notable for the following components:   Total CK 488 (*)    All other components within normal limits  BRAIN NATRIURETIC PEPTIDE - Abnormal; Notable for the following components:   B Natriuretic Peptide 493.2 (*)    All other components within normal limits  TROPONIN I (HIGH SENSITIVITY) - Abnormal; Notable for the following components:   Troponin I (High Sensitivity) 44 (*)    All other components within normal limits  TSH  T4, FREE  URINALYSIS, ROUTINE W REFLEX MICROSCOPIC  AMMONIA  TROPONIN I (HIGH SENSITIVITY)    EKG EKG Interpretation  Date/Time:  Friday May 27 2022 21:57:38 EST Ventricular Rate:  83 PR Interval:  157 QRS Duration: 111 QT Interval:  410 QTC Calculation: 482 R Axis:   -11 Text Interpretation: Sinus rhythm Probable left ventricular hypertrophy Since last tracing rate faster Confirmed by Isla Pence [id number]) on 05/27/2022 10:15:20 PM  Radiology CT ABDOMEN PELVIS W CONTRAST  Result Date: 05/28/2022 CLINICAL DATA:  Abdominal pain, acute, nonlocalized. Fall, left abdominal injury, left hip pain EXAM: CT ABDOMEN AND PELVIS WITH CONTRAST TECHNIQUE: Multidetector CT imaging of the abdomen and pelvis was performed using the standard protocol following bolus administration of intravenous contrast. RADIATION DOSE REDUCTION: This exam was performed according to the departmental dose-optimization program which includes automated exposure control, adjustment of the mA and/or kV according to patient size  and/or use of iterative reconstruction technique. CONTRAST:  69m OMNIPAQUE IOHEXOL 350 MG/ML SOLN COMPARISON:  05/29/2021 FINDINGS: Lower chest: No acute abnormality. Hepatobiliary: TIPS shunt is seen extending from the right hepatic vein to the main portal vein with patency of the shunt. No enhancing intrahepatic mass. Middle and left hepatic veins are patent. Progressive focal dilation of the right hepatic vein just distal to the terminal portion of the shunt is new since prior examination may reflect the presence of an underlying hemodynamically significant obstruction involving the terminal portion of the hepatic vein, not well assessed on this examination. This is best seen on image # 15/3. Gallbladder unremarkable. Stable moderate dilation of the extrahepatic bile duct, nonspecific. No intrahepatic biliary ductal dilation. Pancreas: Atrophic but otherwise unremarkable. Spleen: Stable mild splenomegaly with the spleen measuring 13.8 cm in greatest dimension. No intrasplenic lesions are seen. Splenic vein is patent. Adrenals/Urinary Tract: The adrenal glands are unremarkable. The kidneys are normal in size and position. Stable mild left renal cortical atrophy. The kidneys are otherwise unremarkable. The bladder is unremarkable. Stomach/Bowel: Embolization coil seen within the gastrohepatic ligament. Stomach, small bowel, and large bowel are otherwise unremarkable save for moderate colonic stool burden. No evidence of obstruction or focal inflammation. No free intraperitoneal gas or fluid. Appendix absent. Vascular/Lymphatic: Aortic atherosclerosis. No enlarged abdominal or pelvic lymph nodes. Reproductive: Status post hysterectomy. No adnexal masses. Other: No abdominal wall hernia. Mild subcutaneous body wall edema best appreciated within the flanks bilaterally. No abdominopelvic ascites. Musculoskeletal: L3-L5 lumbar fusion procedure with instrumentation. No acute bone abnormality. No lytic or blastic bone  lesion. IMPRESSION: 1. No acute intra-abdominal pathology identified. No definite radiographic explanation for the patient's reported symptoms. 2. Patent TIPS shunt. Progressive focal dilation of the right hepatic vein just distal to the terminal portion of the shunt may reflect the presence of an underlying hemodynamically significant obstruction involving the terminal portion of the hepatic vein, not well assessed on this examination. Nonemergent TIPS Doppler sonography is recommended for further evaluation. 3. Stable mild  splenomegaly. 4. Moderate colonic stool burden. 5. Aortic atherosclerosis. Aortic Atherosclerosis (ICD10-I70.0). Electronically Signed   By: Fidela Salisbury M.D.   On: 05/28/2022 01:27   CT Head Wo Contrast  Result Date: 05/28/2022 CLINICAL DATA:  Trauma. EXAM: CT HEAD WITHOUT CONTRAST TECHNIQUE: Contiguous axial images were obtained from the base of the skull through the vertex without intravenous contrast. RADIATION DOSE REDUCTION: This exam was performed according to the departmental dose-optimization program which includes automated exposure control, adjustment of the mA and/or kV according to patient size and/or use of iterative reconstruction technique. COMPARISON:  Head CT dated 05/15/2019. FINDINGS: Evaluation of this exam is limited due to motion artifact. Brain: Mild age-related atrophy and chronic microvascular ischemic changes. There is no acute intracranial hemorrhage. Tiny focus of calcification in the right basal ganglia. No mass effect or midline shift. No extra-axial fluid collection. Vascular: No hyperdense vessel or unexpected calcification. Skull: Normal. Negative for fracture or focal lesion. Sinuses/Orbits: No acute finding. Other: None IMPRESSION: 1. No acute intracranial pathology. 2. Mild age-related atrophy and chronic microvascular ischemic changes. Electronically Signed   By: Anner Crete M.D.   On: 05/28/2022 01:11   DG Chest Portable 1 View  Result Date:  05/27/2022 CLINICAL DATA:  Weakness, past medical history of heart failure. Fall last night while in the bathroom EXAM: PORTABLE CHEST 1 VIEW COMPARISON:  None Available. FINDINGS: The heart size and mediastinal contours are within normal limits. Atherosclerotic calcification of the aorta. Sternotomy wires and mediastinal surgical clips suggesting prior coronary artery bypass grafting. Lungs are clear without evidence of focal consolidation or pleural effusion. Thoracic spondylosis and bilateral glenohumeral osteoarthritis right worse than the left. IMPRESSION: No active disease. Electronically Signed   By: Keane Police D.O.   On: 05/27/2022 22:01   DG Hip Unilat W or Wo Pelvis 2-3 Views Left  Result Date: 05/27/2022 CLINICAL DATA:  Fall yesterday, left hip pain EXAM: DG HIP (WITH OR WITHOUT PELVIS) 2-3V LEFT COMPARISON:  None Available. FINDINGS: Osteopenia. No displaced fracture or dislocation of the left hip, or pelvis and proximal right femur seen in single frontal view only. Mild, symmetric appearing bilateral hip arthrosis. Nonobstructive pattern of bowel gas. Stool balls in the rectum. IMPRESSION: Osteopenia. No displaced fracture or dislocation of the left hip, or pelvis and proximal right femur seen in single frontal view only. Consider CT to more sensitively evaluate for fracture if there is high clinical suspicion for fracture. Electronically Signed   By: Delanna Ahmadi M.D.   On: 05/27/2022 17:27   DG Lumbar Spine Complete  Result Date: 05/27/2022 CLINICAL DATA:  Pain after fall EXAM: LUMBAR SPINE - COMPLETE 5 VIEW COMPARISON:  X-ray 05/17/2021 and older FINDINGS: Levoconvex curvature of the spine centered at L3. There are pedicle screws at multiple levels from L3 through L5. Vertical fixation rods only involving L4 and L5. Prosthetic disc material at L3-4. Severe osteopenia. There is disc height loss at L3-4, L4-5 and L5-S1. Anterolisthesis noted trace at L4-5 and L5-S1. Advanced facet  degenerative changes. Elsewhere note is made of diffuse colonic stool. Tips shunt with embolization coils in the upper abdomen. Vascular calcifications. Recommend continue precautions until clinical clearance and if there is further concern of injury additional workup with CT as these injuries can be acutely x-ray occult. IMPRESSION: Severe osteopenia. Curvature of spine with degenerative changes and surgical changes. Trace stable anterolisthesis at L4-5 and L5-S1. Electronically Signed   By: Jill Side M.D.   On: 05/27/2022 17:25  Procedures Procedures    Medications Ordered in ED Medications  fentaNYL (SUBLIMAZE) injection 25 mcg (25 mcg Intravenous Given 05/27/22 2217)  iohexol (OMNIPAQUE) 350 MG/ML injection 75 mL (75 mLs Intravenous Contrast Given 05/28/22 0102)    ED Course/ Medical Decision Making/ A&P                             Medical Decision Making [name] is a 77 yo F complex PMH presenting to the ED for lethargy x2 days with GLF mechanical in nature. On arrival patients vitals BP 132/59, T 98.8, pulse 73 RR 16 satting 96% on RA. Patient appears tired but is GCS 15 AAOx4. She complains of mild generalized abdominal tenderness with no focality. From a trauma perspective, no overt injuries noted.  We opted to obtain a CT scan of the patient's hip given her prior surgeries to the area.  X-ray showed no evidence of acute fracture however given her tenderness in that area there is a concern for underlying injury.  In terms of the patient's lethargy and malaise she does not appear acutely septic on presentation as she is normotensive and nontachycardic she is satting well on room air.  She denies any respiratory symptoms or urinary symptoms.  She is complaining of generalized abdominal tenderness.  Given her age and medical comorbidities we casted [place].  A CT abdomen pelvis with contrast was ordered I ordered a CBC which showed an elevation in white blood cell count of  12.9 ANC 10.5 patient's hemoglobin is stable at 13.8 platelets are 82 which is slightly depressed from patient's baseline of 100.  Patient states that she only had a short amount of downtime CK resulted at 488 CMP shows no acute elevation in creatinine sodium slightly depressed at 133 bicarb slight depressed at 20 no elevation in anion gap LFTs are grossly within normal limits.  Given her lethargy we also ordered a free T4 as well as TSH both of which were within normal limits.  ACS is on the differential given the patient's advanced age EKG has poor baseline but shows no evidence of acute ST segment elevation or depression intervals are grossly maintained.  Initial troponin elevated at 44 which is above patient's baseline.  Will follow-up troponin delta in 2 hours.  BMP was ordered and resulted slightly elevated at 493 however relative to this patient is not on an acute rise.  Patient does not appear floridly volume overloaded she does not have bilateral lower extremity edema lungs are generally clear to auscultation bilaterally.  RVP ordered shows patient is positive for RSV.  Patient's chest x-ray shows no focal consolidations concerning for pneumonia.  Overall this could be the etiology of patient's lethargy.  She is of advanced age with multiple medical comorbidities.  At the time of handoff patient was awaiting formal CT of hip in order to rule out fracture.  See oncoming providers note for ultimate disposition.   Amount and/or Complexity of Data Reviewed Labs: ordered. Radiology: ordered.  Risk Prescription drug management.           Final Clinical Impression(s) / ED Diagnoses Final diagnoses:  Lethargy  Ground-level fall    Rx / DC Orders ED Discharge Orders     None         Donzetta Matters, MD 05/28/22 9450    Orpah Greek, MD 05/28/22 [phone]

## 2022-05-27 NOTE — ED Provider Notes (Incomplete)
[facility] Provider Note   CSN: [id number] Arrival date & time: 05/27/22  1337     History {Add pertinent medical, surgical, social history, OB history to HPI:1} No chief complaint on file.   [name] is a 77 y.o. female.  HPI     Home Medications Prior to Admission medications   Medication Sig Start Date End Date Taking? Authorizing Provider  Biotin 10000 MCG TABS Take 10,000 mcg by mouth daily.    [provider]  Calcium Carb-Cholecalciferol (CALCIUM 600 + D PO) Take 1 tablet by mouth in the morning and at bedtime.    [provider]  Cholecalciferol (VITAMIN D-3) 25 MCG (1000 UT) CAPS Take 1,000 Units by mouth daily.    [provider]  Cyanocobalamin (VITAMIN B 12 PO) Take 1,000 mcg by mouth daily.    [provider]  empagliflozin (JARDIANCE) 25 MG TABS tablet Take 1 tablet (25 mg total) by mouth daily. 03/14/22   [name], MD  ezetimibe (ZETIA) 10 MG tablet Take 1 tablet (10 mg total) by mouth daily. 02/09/22   [name], MD  fluticasone (FLONASE) 50 MCG/ACT nasal spray Place 1 spray into both nostrils daily as needed for allergies or rhinitis.    [provider]  furosemide (LASIX) 20 MG tablet TAKE 1 TABLET(20 MG) BY MOUTH DAILY 03/16/22   Josue Hector, MD  glucose blood test strip One Touch Ultra Blue test strips. Use to test blood sugar three times daily. Dx: E11.65 02/09/22   [name], MD  HYDROcodone-acetaminophen (NORCO) 5-325 MG tablet Take 1 tablet by mouth every 6 (six) hours as needed for moderate pain. 05/18/22   [name], MD  insulin aspart (NOVOLOG) 100 UNIT/ML injection Inject 100 Units into the skin 3 (three) times daily before meals. Take 4 units before breakfast and 6 uints before lunch/supper 05/18/22   [name], MD  insulin degludec (TRESIBA FLEXTOUCH) 100 UNIT/ML FlexTouch Pen Inject 25 Units into the skin at  bedtime. 05/18/22   [name], MD  Insulin Pen Needle (B-D ULTRAFINE III SHORT PEN) 31G X 8 MM MISC Use to give Insulin injections daily. Dx: E11.65 10/11/21   [name], MD  Insulin Syringe-Needle U-100 (INSULIN SYRINGE 1CC/31GX5/16") 31G X 5/16" 1 ML MISC USE AS DIRECTED TO INJECT LEVIMIR 07/29/20   [name], MD  lactulose [facility]) 10 GM/15ML solution TAKE 30 ML BY MOUTH EVERY DAY 11/26/21   [name], MD  LORazepam (ATIVAN) 0.5 MG tablet Take 1 tablet (0.5 mg total) by mouth at bedtime as needed for anxiety. 03/24/22   Medina-Vargas, Monina C, NP  MAGNESIUM PO Take 500 mg by mouth daily.     [provider]  metoprolol tartrate (LOPRESSOR) 25 MG tablet Take 1 tablet (25 mg total) by mouth as needed (use as needed for HR greater than 110). 08/21/19   Tommie Raymond, NP  Multiple Vitamins-Minerals (MULTIVITAMIN WITH MINERALS) tablet Take 1 tablet by mouth daily.    [provider]  pantoprazole (PROTONIX) 40 MG tablet TAKE 1 TABLET(40 MG) BY MOUTH DAILY 10/25/21   [name], MD  Polyethyl Glycol-Propyl Glycol (SYSTANE OP) Place 1 drop into both eyes at bedtime.    [provider]  Probiotic Product (PROBIOTIC DAILY PO) Take 1 capsule by mouth daily. Digestive Advantage Probiotic    [provider]  XIFAXAN 550 MG TABS tablet Take 1  tablet (550 mg total) by mouth 2 (two) times daily. 06/30/21   [name], MD      Allergies    Kiwi extract, Tdap [tetanus-diphth-acell pertussis], Latex, Statins, and Tramadol    Review of Systems   Review of Systems  Physical Exam Updated Vital Signs BP (!) 161/85 (BP Location: Left Arm)   Pulse 78   Temp 99.6 F (37.6 C)   Resp 15   LMP  (LMP Unknown)   SpO2 99%  Physical Exam  ED Results / Procedures / Treatments   Labs (all labs ordered are listed, but only abnormal results are displayed) Labs Reviewed  RESP PANEL BY RT-PCR (RSV, FLU A&B, COVID)  RVPGX2  CBC WITH  DIFFERENTIAL/PLATELET  COMPREHENSIVE METABOLIC PANEL  URINALYSIS, ROUTINE W REFLEX MICROSCOPIC  CK  TSH  T4, FREE  BRAIN NATRIURETIC PEPTIDE  AMMONIA  TROPONIN I (HIGH SENSITIVITY)    EKG None  Radiology DG Hip Unilat W or Wo Pelvis 2-3 Views Left  Result Date: 05/27/2022 CLINICAL DATA:  Fall yesterday, left hip pain EXAM: DG HIP (WITH OR WITHOUT PELVIS) 2-3V LEFT COMPARISON:  None Available. FINDINGS: Osteopenia. No displaced fracture or dislocation of the left hip, or pelvis and proximal right femur seen in single frontal view only. Mild, symmetric appearing bilateral hip arthrosis. Nonobstructive pattern of bowel gas. Stool balls in the rectum. IMPRESSION: Osteopenia. No displaced fracture or dislocation of the left hip, or pelvis and proximal right femur seen in single frontal view only. Consider CT to more sensitively evaluate for fracture if there is high clinical suspicion for fracture. Electronically Signed   By: Delanna Ahmadi M.D.   On: 05/27/2022 17:27   DG Lumbar Spine Complete  Result Date: 05/27/2022 CLINICAL DATA:  Pain after fall EXAM: LUMBAR SPINE - COMPLETE 5 VIEW COMPARISON:  X-ray 05/17/2021 and older FINDINGS: Levoconvex curvature of the spine centered at L3. There are pedicle screws at multiple levels from L3 through L5. Vertical fixation rods only involving L4 and L5. Prosthetic disc material at L3-4. Severe osteopenia. There is disc height loss at L3-4, L4-5 and L5-S1. Anterolisthesis noted trace at L4-5 and L5-S1. Advanced facet degenerative changes. Elsewhere note is made of diffuse colonic stool. Tips shunt with embolization coils in the upper abdomen. Vascular calcifications. Recommend continue precautions until clinical clearance and if there is further concern of injury additional workup with CT as these injuries can be acutely x-ray occult. IMPRESSION: Severe osteopenia. Curvature of spine with degenerative changes and surgical changes. Trace stable anterolisthesis  at L4-5 and L5-S1. Electronically Signed   By: Jill Side M.D.   On: 05/27/2022 17:25    Procedures Procedures  {Document cardiac monitor, telemetry assessment procedure when appropriate:1}  Medications Ordered in ED Medications - No data to display  ED Course/ Medical Decision Making/ A&P   {   Click here for ABCD2, HEART and other calculatorsREFRESH Note before signing :1}                          Medical Decision Making Amount and/or Complexity of Data Reviewed Labs: ordered. Radiology: ordered.   ***  {Document critical care time when appropriate:1} {Document review of labs and clinical decision tools ie heart score, Chads2Vasc2 etc:1}  {Document your independent review of radiology images, and any outside records:1} {Document your discussion with family members, caretakers, and with consultants:1} {Document social determinants of health affecting pt's care:1} {Document your decision making why or why not  admission, treatments were needed:1} Final Clinical Impression(s) / ED Diagnoses Final diagnoses:  None    Rx / DC Orders ED Discharge Orders     None

## 2022-05-27 NOTE — ED Provider Triage Note (Signed)
Emergency Medicine Provider Triage Evaluation Note  [name] , a 77 y.o. female  was evaluated in triage.  Pt complains of mechanical fall last night while in the bathroom. Was holding onto the counter and lost her grip, falling directly onto her left side. Complaining of L hip pain, which she's needed surgery on before. No numbness. No head trauma or LOC. Not on blood thinners. Normally uses a cane.   Review of Systems  Positive: L hip pain Negative: Numbness, head trauma  Physical Exam  BP (!) 132/59   Pulse 73   Temp 98.8 F (37.1 C)   Resp 16   LMP  (LMP Unknown)   SpO2 96%  Gen:   Awake, no distress   Resp:  Normal effort  MSK:   Moves extremities without difficulty  Other:  No leg swelling, shortening or rotation  Medical Decision Making  Medically screening exam initiated at 4:13 PM.  Appropriate orders placed.  Maxie Better Lovin was informed that the remainder of the evaluation will be completed by another provider, this initial triage assessment does not replace that evaluation, and the importance of remaining in the ED until their evaluation is complete.  Imaging ordered   Kateri Plummer, PA-C 05/27/22 1613

## 2022-05-27 NOTE — Progress Notes (Signed)
[facility])                                            [place] Team    05/27/2022  Carol Theys 1945-06-05 [id number]                                      Medication Assistance Referral  Referral From: [place]  Medication/Company: Tyler Aas (replacing Levemir which was removed from [place]) / [name] Patient application portion:  N/A already have signed application on file Provider application portion: Faxed  to Dr. Alain Honey Provider address/fax verified via: Office website  Medication/Company: Cira Servant / [name] Patient application portion:  N/A already have signed application on file Provider application portion: Faxed  to Dr. Alain Honey Provider address/fax verified via: Office website   [employer]. Dewana Ammirati, [place]  [phone]

## 2022-05-28 ENCOUNTER — Emergency Department (HOSPITAL_COMMUNITY): Payer: Medicare Other

## 2022-05-28 DIAGNOSIS — M25552 Pain in left hip: Secondary | ICD-10-CM | POA: Diagnosis not present

## 2022-05-28 DIAGNOSIS — T1490XA Injury, unspecified, initial encounter: Secondary | ICD-10-CM | POA: Diagnosis not present

## 2022-05-28 DIAGNOSIS — I700 Atherosclerosis of aorta: Secondary | ICD-10-CM | POA: Diagnosis not present

## 2022-05-28 DIAGNOSIS — R109 Unspecified abdominal pain: Secondary | ICD-10-CM | POA: Diagnosis not present

## 2022-05-28 DIAGNOSIS — Z043 Encounter for examination and observation following other accident: Secondary | ICD-10-CM | POA: Diagnosis not present

## 2022-05-28 LAB — TROPONIN I (HIGH SENSITIVITY): Troponin I (High Sensitivity): 37 ng/L — ABNORMAL HIGH (ref <18)

## 2022-05-28 MED ORDER — IOHEXOL 350 MG/ML SOLN
75.0000 mL | Freq: Once | INTRAVENOUS | Status: AC | PRN
  Administered 2022-05-28: 75 mL via INTRAVENOUS

## 2022-05-28 NOTE — ED Notes (Signed)
Ambulated with walker, steady gate noted. Denies pain with ambulation, dizziness or light headedness

## 2022-06-01 ENCOUNTER — Emergency Department (HOSPITAL_COMMUNITY)
Admission: EM | Admit: 2022-06-01 | Discharge: 2022-06-02 | Discharge status: Against Medical Advice | Payer: Medicare Other | Attending: Emergency Medicine | Admitting: Emergency Medicine

## 2022-06-01 ENCOUNTER — Telehealth: Payer: Self-pay

## 2022-06-01 DIAGNOSIS — R2242 Localized swelling, mass and lump, left lower limb: Secondary | ICD-10-CM | POA: Diagnosis not present

## 2022-06-01 DIAGNOSIS — S80919A Unspecified superficial injury of unspecified knee, initial encounter: Secondary | ICD-10-CM | POA: Diagnosis not present

## 2022-06-01 DIAGNOSIS — W19XXXA Unspecified fall, initial encounter: Secondary | ICD-10-CM | POA: Diagnosis not present

## 2022-06-01 DIAGNOSIS — M25552 Pain in left hip: Secondary | ICD-10-CM | POA: Insufficient documentation

## 2022-06-01 DIAGNOSIS — M79605 Pain in left leg: Secondary | ICD-10-CM | POA: Insufficient documentation

## 2022-06-01 DIAGNOSIS — Z5321 Procedure and treatment not carried out due to patient leaving prior to being seen by health care provider: Secondary | ICD-10-CM | POA: Diagnosis not present

## 2022-06-01 DIAGNOSIS — R609 Edema, unspecified: Secondary | ICD-10-CM | POA: Diagnosis not present

## 2022-06-01 LAB — CBC
HCT: 38.9 % (ref 36.0–46.0)
Hemoglobin: 13.1 g/dL (ref 12.0–15.0)
MCH: 31 pg (ref 26.0–34.0)
MCHC: 33.7 g/dL (ref 30.0–36.0)
MCV: 92 fL (ref 80.0–100.0)
Platelets: 103 10*3/uL — ABNORMAL LOW (ref 150–400)
RBC: 4.23 MIL/uL (ref 3.87–5.11)
RDW: 13.6 % (ref 11.5–15.5)
WBC: 11.2 10*3/uL — ABNORMAL HIGH (ref 4.0–10.5)
nRBC: 0 % (ref 0.0–0.2)

## 2022-06-01 NOTE — ED Provider Triage Note (Signed)
Emergency Medicine Provider Triage Evaluation Note  [name] , a 77 y.o. female  was evaluated in triage.  Pt complains of left hip and leg pain.  Patient had a fall on Tuesday last week and was evaluated at [facility] with negative CT and x-ray and was discharged home.  Patient has not been able to follow-up with Ortho due to difficulty ambulating.  Reports that her left foot has significant swelling since the fall.  Review of Systems  Positive: As above Negative: As above  Physical Exam  BP (!) 131/57 (BP Location: Left Arm)   Pulse 76   Temp 99.3 F (37.4 C) (Oral)   Resp 16   LMP  (LMP Unknown)   SpO2 96%  Gen:   Awake, no distress patient Resp:  Normal effort  MSK:   Moves extremities without difficulty  Other:    Medical Decision Making  Medically screening exam initiated at 12:10 PM.  Appropriate orders placed.  [name] was informed that the remainder of the evaluation will be completed by another provider, this initial triage assessment does not replace that evaluation, and the importance of remaining in the ED until their evaluation is complete.    [name], [place] 06/01/22 1425

## 2022-06-01 NOTE — Telephone Encounter (Signed)
Transition Care Management Unsuccessful Follow-up Telephone Call  Date of discharge and from where:  05/28/2022, [facility]  Attempts:  2nd Attempt  Reason for unsuccessful TCM follow-up call:  Unable to reach patient spoke with patients husband and he said that she is headed back to the hospital for leg pain, patient not doing well MLP1/24/2024

## 2022-06-01 NOTE — ED Triage Notes (Signed)
Pt arrives via [facility] from home for continued L leg and hip pain after a fall last week. Pt was evaluated for same with CT and x-rays and was discharged home. Unable to follow up with ortho since due to pain and difficulty ambulating. Pt denies new injury.

## 2022-06-02 ENCOUNTER — Telehealth: Payer: Self-pay | Admitting: Pharmacy Technician

## 2022-06-02 ENCOUNTER — Other Ambulatory Visit: Payer: Self-pay | Admitting: Adult Health

## 2022-06-02 ENCOUNTER — Telehealth: Payer: Self-pay | Admitting: *Deleted

## 2022-06-02 DIAGNOSIS — Z885 Allergy status to narcotic agent status: Secondary | ICD-10-CM | POA: Diagnosis not present

## 2022-06-02 DIAGNOSIS — L03116 Cellulitis of left lower limb: Secondary | ICD-10-CM | POA: Diagnosis not present

## 2022-06-02 DIAGNOSIS — Z596 Low income: Secondary | ICD-10-CM

## 2022-06-02 DIAGNOSIS — Z888 Allergy status to other drugs, medicaments and biological substances status: Secondary | ICD-10-CM | POA: Diagnosis not present

## 2022-06-02 DIAGNOSIS — R296 Repeated falls: Secondary | ICD-10-CM | POA: Diagnosis not present

## 2022-06-02 DIAGNOSIS — G8911 Acute pain due to trauma: Secondary | ICD-10-CM | POA: Diagnosis not present

## 2022-06-02 DIAGNOSIS — M7989 Other specified soft tissue disorders: Secondary | ICD-10-CM | POA: Diagnosis not present

## 2022-06-02 MED ORDER — INSULIN ASPART 100 UNIT/ML IJ SOLN: 4.0000 [IU] | Freq: Three times a day (TID) | INTRAMUSCULAR | 99 refills | Status: DC

## 2022-06-02 NOTE — Progress Notes (Signed)
[facility])                                            [place] Team    06/02/2022  Ellamay Fors [birth year] [id number]  Received both patient and provider portion(s) of patient assistance application(s) for [place] and Novolog. Faxed completed application and required documents into [name].  Sharee Pimple P. Rebeccah Ivins, [place]  [phone]

## 2022-06-02 NOTE — Telephone Encounter (Signed)
Medina-Vargas, Monina C, NP  You2 minutes ago (11:33 AM)    I have clarified the order in EPIC.   Message sent to [facility] with [facility]

## 2022-06-02 NOTE — Telephone Encounter (Signed)
Sharee Pimple with [facility] is trying to get patient assistance for patient's Novolog Insulin and need to Clarify the Directions of how patient is suppose to be taking the insulin.   Please Clarify.  There are 2 different Directions in Current Medication List.   insulin aspart (NOVOLOG) 100 UNIT/ML injection Inject 100 Units into the skin 3 (three) times daily before meals. Take 4 units before breakfast and 6 uints before lunch/supper    Please Advise.

## 2022-06-03 ENCOUNTER — Telehealth: Payer: Self-pay

## 2022-06-03 NOTE — Telephone Encounter (Signed)
h

## 2022-06-05 DIAGNOSIS — I69311 Memory deficit following cerebral infarction: Secondary | ICD-10-CM | POA: Diagnosis not present

## 2022-06-05 DIAGNOSIS — K7469 Other cirrhosis of liver: Secondary | ICD-10-CM | POA: Diagnosis present

## 2022-06-05 DIAGNOSIS — D638 Anemia in other chronic diseases classified elsewhere: Secondary | ICD-10-CM | POA: Diagnosis not present

## 2022-06-05 DIAGNOSIS — R5381 Other malaise: Secondary | ICD-10-CM | POA: Diagnosis present

## 2022-06-05 DIAGNOSIS — E871 Hypo-osmolality and hyponatremia: Secondary | ICD-10-CM | POA: Diagnosis present

## 2022-06-05 DIAGNOSIS — R29701 NIHSS score 1: Secondary | ICD-10-CM | POA: Diagnosis present

## 2022-06-05 DIAGNOSIS — N3001 Acute cystitis with hematuria: Secondary | ICD-10-CM | POA: Diagnosis not present

## 2022-06-05 DIAGNOSIS — E785 Hyperlipidemia, unspecified: Secondary | ICD-10-CM | POA: Diagnosis not present

## 2022-06-05 DIAGNOSIS — G459 Transient cerebral ischemic attack, unspecified: Secondary | ICD-10-CM | POA: Diagnosis not present

## 2022-06-05 DIAGNOSIS — M6281 Muscle weakness (generalized): Secondary | ICD-10-CM | POA: Diagnosis not present

## 2022-06-05 DIAGNOSIS — L03116 Cellulitis of left lower limb: Secondary | ICD-10-CM | POA: Diagnosis present

## 2022-06-05 DIAGNOSIS — K7581 Nonalcoholic steatohepatitis (NASH): Secondary | ICD-10-CM | POA: Diagnosis not present

## 2022-06-05 DIAGNOSIS — I639 Cerebral infarction, unspecified: Secondary | ICD-10-CM | POA: Diagnosis not present

## 2022-06-05 DIAGNOSIS — I8510 Secondary esophageal varices without bleeding: Secondary | ICD-10-CM | POA: Diagnosis not present

## 2022-06-05 DIAGNOSIS — I2510 Atherosclerotic heart disease of native coronary artery without angina pectoris: Secondary | ICD-10-CM | POA: Diagnosis not present

## 2022-06-05 DIAGNOSIS — K7682 Hepatic encephalopathy: Secondary | ICD-10-CM | POA: Diagnosis present

## 2022-06-05 DIAGNOSIS — K7460 Unspecified cirrhosis of liver: Secondary | ICD-10-CM | POA: Diagnosis not present

## 2022-06-05 DIAGNOSIS — G939 Disorder of brain, unspecified: Secondary | ICD-10-CM | POA: Diagnosis not present

## 2022-06-05 DIAGNOSIS — I69322 Dysarthria following cerebral infarction: Secondary | ICD-10-CM | POA: Diagnosis not present

## 2022-06-05 DIAGNOSIS — R4781 Slurred speech: Secondary | ICD-10-CM | POA: Diagnosis present

## 2022-06-05 DIAGNOSIS — I8501 Esophageal varices with bleeding: Secondary | ICD-10-CM | POA: Diagnosis not present

## 2022-06-05 DIAGNOSIS — I6359 Cerebral infarction due to unspecified occlusion or stenosis of other cerebral artery: Secondary | ICD-10-CM | POA: Diagnosis not present

## 2022-06-05 DIAGNOSIS — I509 Heart failure, unspecified: Secondary | ICD-10-CM | POA: Diagnosis not present

## 2022-06-05 DIAGNOSIS — R9082 White matter disease, unspecified: Secondary | ICD-10-CM | POA: Diagnosis not present

## 2022-06-05 DIAGNOSIS — I10 Essential (primary) hypertension: Secondary | ICD-10-CM | POA: Diagnosis not present

## 2022-06-05 DIAGNOSIS — Z951 Presence of aortocoronary bypass graft: Secondary | ICD-10-CM | POA: Diagnosis not present

## 2022-06-05 DIAGNOSIS — H9190 Unspecified hearing loss, unspecified ear: Secondary | ICD-10-CM | POA: Diagnosis present

## 2022-06-05 DIAGNOSIS — I130 Hypertensive heart and chronic kidney disease with heart failure and stage 1 through stage 4 chronic kidney disease, or unspecified chronic kidney disease: Secondary | ICD-10-CM | POA: Diagnosis present

## 2022-06-05 DIAGNOSIS — N1831 Chronic kidney disease, stage 3a: Secondary | ICD-10-CM | POA: Diagnosis not present

## 2022-06-05 DIAGNOSIS — G9340 Encephalopathy, unspecified: Secondary | ICD-10-CM | POA: Diagnosis not present

## 2022-06-05 DIAGNOSIS — I6523 Occlusion and stenosis of bilateral carotid arteries: Secondary | ICD-10-CM | POA: Diagnosis present

## 2022-06-05 DIAGNOSIS — M810 Age-related osteoporosis without current pathological fracture: Secondary | ICD-10-CM | POA: Diagnosis not present

## 2022-06-05 DIAGNOSIS — I5032 Chronic diastolic (congestive) heart failure: Secondary | ICD-10-CM | POA: Diagnosis not present

## 2022-06-05 DIAGNOSIS — K219 Gastro-esophageal reflux disease without esophagitis: Secondary | ICD-10-CM | POA: Diagnosis present

## 2022-06-05 DIAGNOSIS — E860 Dehydration: Secondary | ICD-10-CM | POA: Diagnosis present

## 2022-06-05 DIAGNOSIS — E440 Moderate protein-calorie malnutrition: Secondary | ICD-10-CM | POA: Diagnosis not present

## 2022-06-05 DIAGNOSIS — I480 Paroxysmal atrial fibrillation: Secondary | ICD-10-CM | POA: Diagnosis present

## 2022-06-05 DIAGNOSIS — E559 Vitamin D deficiency, unspecified: Secondary | ICD-10-CM | POA: Diagnosis not present

## 2022-06-05 DIAGNOSIS — R4182 Altered mental status, unspecified: Secondary | ICD-10-CM | POA: Diagnosis not present

## 2022-06-05 DIAGNOSIS — E1169 Type 2 diabetes mellitus with other specified complication: Secondary | ICD-10-CM | POA: Diagnosis not present

## 2022-06-05 DIAGNOSIS — E1122 Type 2 diabetes mellitus with diabetic chronic kidney disease: Secondary | ICD-10-CM | POA: Diagnosis present

## 2022-06-05 DIAGNOSIS — R935 Abnormal findings on diagnostic imaging of other abdominal regions, including retroperitoneum: Secondary | ICD-10-CM | POA: Diagnosis not present

## 2022-06-05 DIAGNOSIS — E861 Hypovolemia: Secondary | ICD-10-CM | POA: Diagnosis present

## 2022-06-05 DIAGNOSIS — I6389 Other cerebral infarction: Secondary | ICD-10-CM | POA: Diagnosis present

## 2022-06-05 DIAGNOSIS — Z794 Long term (current) use of insulin: Secondary | ICD-10-CM | POA: Diagnosis not present

## 2022-06-05 DIAGNOSIS — G9341 Metabolic encephalopathy: Secondary | ICD-10-CM | POA: Diagnosis present

## 2022-06-05 DIAGNOSIS — D696 Thrombocytopenia, unspecified: Secondary | ICD-10-CM | POA: Diagnosis present

## 2022-06-05 DIAGNOSIS — I083 Combined rheumatic disorders of mitral, aortic and tricuspid valves: Secondary | ICD-10-CM | POA: Diagnosis not present

## 2022-06-05 DIAGNOSIS — B379 Candidiasis, unspecified: Secondary | ICD-10-CM | POA: Diagnosis not present

## 2022-06-05 DIAGNOSIS — E8809 Other disorders of plasma-protein metabolism, not elsewhere classified: Secondary | ICD-10-CM | POA: Diagnosis not present

## 2022-06-06 ENCOUNTER — Telehealth: Payer: Medicare Other | Admitting: Family

## 2022-06-06 ENCOUNTER — Telehealth: Payer: Self-pay | Admitting: Gastroenterology

## 2022-06-06 DIAGNOSIS — R5381 Other malaise: Secondary | ICD-10-CM | POA: Diagnosis present

## 2022-06-06 DIAGNOSIS — R4781 Slurred speech: Secondary | ICD-10-CM | POA: Diagnosis present

## 2022-06-06 DIAGNOSIS — I6523 Occlusion and stenosis of bilateral carotid arteries: Secondary | ICD-10-CM | POA: Diagnosis present

## 2022-06-06 DIAGNOSIS — I8510 Secondary esophageal varices without bleeding: Secondary | ICD-10-CM | POA: Diagnosis present

## 2022-06-06 DIAGNOSIS — N1831 Chronic kidney disease, stage 3a: Secondary | ICD-10-CM | POA: Diagnosis present

## 2022-06-06 DIAGNOSIS — E785 Hyperlipidemia, unspecified: Secondary | ICD-10-CM | POA: Diagnosis not present

## 2022-06-06 DIAGNOSIS — I6359 Cerebral infarction due to unspecified occlusion or stenosis of other cerebral artery: Secondary | ICD-10-CM | POA: Diagnosis not present

## 2022-06-06 DIAGNOSIS — E1122 Type 2 diabetes mellitus with diabetic chronic kidney disease: Secondary | ICD-10-CM | POA: Diagnosis present

## 2022-06-06 DIAGNOSIS — K7682 Hepatic encephalopathy: Secondary | ICD-10-CM | POA: Diagnosis present

## 2022-06-06 DIAGNOSIS — I083 Combined rheumatic disorders of mitral, aortic and tricuspid valves: Secondary | ICD-10-CM | POA: Diagnosis not present

## 2022-06-06 DIAGNOSIS — I5032 Chronic diastolic (congestive) heart failure: Secondary | ICD-10-CM | POA: Diagnosis not present

## 2022-06-06 DIAGNOSIS — H9190 Unspecified hearing loss, unspecified ear: Secondary | ICD-10-CM | POA: Diagnosis present

## 2022-06-06 DIAGNOSIS — K7460 Unspecified cirrhosis of liver: Secondary | ICD-10-CM | POA: Diagnosis not present

## 2022-06-06 DIAGNOSIS — R29701 NIHSS score 1: Secondary | ICD-10-CM | POA: Diagnosis present

## 2022-06-06 DIAGNOSIS — I69322 Dysarthria following cerebral infarction: Secondary | ICD-10-CM | POA: Diagnosis not present

## 2022-06-06 DIAGNOSIS — I509 Heart failure, unspecified: Secondary | ICD-10-CM | POA: Diagnosis present

## 2022-06-06 DIAGNOSIS — K7581 Nonalcoholic steatohepatitis (NASH): Secondary | ICD-10-CM | POA: Diagnosis present

## 2022-06-06 DIAGNOSIS — I2510 Atherosclerotic heart disease of native coronary artery without angina pectoris: Secondary | ICD-10-CM | POA: Diagnosis present

## 2022-06-06 DIAGNOSIS — R9082 White matter disease, unspecified: Secondary | ICD-10-CM | POA: Diagnosis not present

## 2022-06-06 DIAGNOSIS — D696 Thrombocytopenia, unspecified: Secondary | ICD-10-CM | POA: Diagnosis present

## 2022-06-06 DIAGNOSIS — K7469 Other cirrhosis of liver: Secondary | ICD-10-CM | POA: Diagnosis present

## 2022-06-06 DIAGNOSIS — L03116 Cellulitis of left lower limb: Secondary | ICD-10-CM | POA: Diagnosis present

## 2022-06-06 DIAGNOSIS — K219 Gastro-esophageal reflux disease without esophagitis: Secondary | ICD-10-CM | POA: Diagnosis present

## 2022-06-06 DIAGNOSIS — Z951 Presence of aortocoronary bypass graft: Secondary | ICD-10-CM | POA: Diagnosis not present

## 2022-06-06 DIAGNOSIS — E440 Moderate protein-calorie malnutrition: Secondary | ICD-10-CM | POA: Diagnosis present

## 2022-06-06 DIAGNOSIS — I69311 Memory deficit following cerebral infarction: Secondary | ICD-10-CM | POA: Diagnosis not present

## 2022-06-06 DIAGNOSIS — I6389 Other cerebral infarction: Secondary | ICD-10-CM | POA: Diagnosis present

## 2022-06-06 DIAGNOSIS — D638 Anemia in other chronic diseases classified elsewhere: Secondary | ICD-10-CM | POA: Diagnosis not present

## 2022-06-06 DIAGNOSIS — I8501 Esophageal varices with bleeding: Secondary | ICD-10-CM | POA: Diagnosis not present

## 2022-06-06 DIAGNOSIS — G9341 Metabolic encephalopathy: Secondary | ICD-10-CM | POA: Diagnosis present

## 2022-06-06 DIAGNOSIS — M810 Age-related osteoporosis without current pathological fracture: Secondary | ICD-10-CM | POA: Diagnosis not present

## 2022-06-06 DIAGNOSIS — Z794 Long term (current) use of insulin: Secondary | ICD-10-CM | POA: Diagnosis not present

## 2022-06-06 DIAGNOSIS — I639 Cerebral infarction, unspecified: Secondary | ICD-10-CM | POA: Diagnosis not present

## 2022-06-06 DIAGNOSIS — E861 Hypovolemia: Secondary | ICD-10-CM | POA: Diagnosis present

## 2022-06-06 DIAGNOSIS — I480 Paroxysmal atrial fibrillation: Secondary | ICD-10-CM | POA: Diagnosis present

## 2022-06-06 DIAGNOSIS — E871 Hypo-osmolality and hyponatremia: Secondary | ICD-10-CM | POA: Diagnosis present

## 2022-06-06 DIAGNOSIS — E860 Dehydration: Secondary | ICD-10-CM | POA: Diagnosis present

## 2022-06-06 DIAGNOSIS — E8809 Other disorders of plasma-protein metabolism, not elsewhere classified: Secondary | ICD-10-CM | POA: Diagnosis not present

## 2022-06-06 DIAGNOSIS — E1169 Type 2 diabetes mellitus with other specified complication: Secondary | ICD-10-CM | POA: Diagnosis not present

## 2022-06-06 DIAGNOSIS — I130 Hypertensive heart and chronic kidney disease with heart failure and stage 1 through stage 4 chronic kidney disease, or unspecified chronic kidney disease: Secondary | ICD-10-CM | POA: Diagnosis present

## 2022-06-06 DIAGNOSIS — M6281 Muscle weakness (generalized): Secondary | ICD-10-CM | POA: Diagnosis not present

## 2022-06-06 DIAGNOSIS — E559 Vitamin D deficiency, unspecified: Secondary | ICD-10-CM | POA: Diagnosis not present

## 2022-06-06 DIAGNOSIS — G459 Transient cerebral ischemic attack, unspecified: Secondary | ICD-10-CM | POA: Diagnosis not present

## 2022-06-06 DIAGNOSIS — I10 Essential (primary) hypertension: Secondary | ICD-10-CM | POA: Diagnosis not present

## 2022-06-06 NOTE — Telephone Encounter (Signed)
Patient is requesting to complete patient assistance forms for Zifaxan medication. Please advise.

## 2022-06-09 DIAGNOSIS — R110 Nausea: Secondary | ICD-10-CM | POA: Diagnosis not present

## 2022-06-09 DIAGNOSIS — R600 Localized edema: Secondary | ICD-10-CM | POA: Diagnosis not present

## 2022-06-09 DIAGNOSIS — G9341 Metabolic encephalopathy: Secondary | ICD-10-CM | POA: Diagnosis not present

## 2022-06-09 DIAGNOSIS — R0981 Nasal congestion: Secondary | ICD-10-CM | POA: Diagnosis not present

## 2022-06-09 DIAGNOSIS — E440 Moderate protein-calorie malnutrition: Secondary | ICD-10-CM | POA: Diagnosis not present

## 2022-06-09 DIAGNOSIS — I491 Atrial premature depolarization: Secondary | ICD-10-CM | POA: Diagnosis not present

## 2022-06-09 DIAGNOSIS — I10 Essential (primary) hypertension: Secondary | ICD-10-CM | POA: Diagnosis not present

## 2022-06-09 DIAGNOSIS — M6281 Muscle weakness (generalized): Secondary | ICD-10-CM | POA: Diagnosis not present

## 2022-06-09 DIAGNOSIS — I5032 Chronic diastolic (congestive) heart failure: Secondary | ICD-10-CM | POA: Diagnosis not present

## 2022-06-09 DIAGNOSIS — N390 Urinary tract infection, site not specified: Secondary | ICD-10-CM | POA: Diagnosis not present

## 2022-06-09 DIAGNOSIS — E559 Vitamin D deficiency, unspecified: Secondary | ICD-10-CM | POA: Diagnosis not present

## 2022-06-09 DIAGNOSIS — E860 Dehydration: Secondary | ICD-10-CM | POA: Diagnosis not present

## 2022-06-09 DIAGNOSIS — I2489 Other forms of acute ischemic heart disease: Secondary | ICD-10-CM | POA: Diagnosis present

## 2022-06-09 DIAGNOSIS — E1122 Type 2 diabetes mellitus with diabetic chronic kidney disease: Secondary | ICD-10-CM | POA: Diagnosis present

## 2022-06-09 DIAGNOSIS — E1142 Type 2 diabetes mellitus with diabetic polyneuropathy: Secondary | ICD-10-CM | POA: Diagnosis present

## 2022-06-09 DIAGNOSIS — K7469 Other cirrhosis of liver: Secondary | ICD-10-CM | POA: Diagnosis not present

## 2022-06-09 DIAGNOSIS — K219 Gastro-esophageal reflux disease without esophagitis: Secondary | ICD-10-CM | POA: Diagnosis not present

## 2022-06-09 DIAGNOSIS — M7989 Other specified soft tissue disorders: Secondary | ICD-10-CM | POA: Diagnosis not present

## 2022-06-09 DIAGNOSIS — K7581 Nonalcoholic steatohepatitis (NASH): Secondary | ICD-10-CM | POA: Diagnosis not present

## 2022-06-09 DIAGNOSIS — L89312 Pressure ulcer of right buttock, stage 2: Secondary | ICD-10-CM | POA: Diagnosis not present

## 2022-06-09 DIAGNOSIS — M79661 Pain in right lower leg: Secondary | ICD-10-CM | POA: Diagnosis present

## 2022-06-09 DIAGNOSIS — I130 Hypertensive heart and chronic kidney disease with heart failure and stage 1 through stage 4 chronic kidney disease, or unspecified chronic kidney disease: Secondary | ICD-10-CM | POA: Diagnosis present

## 2022-06-09 DIAGNOSIS — E876 Hypokalemia: Secondary | ICD-10-CM | POA: Diagnosis not present

## 2022-06-09 DIAGNOSIS — E7849 Other hyperlipidemia: Secondary | ICD-10-CM | POA: Diagnosis not present

## 2022-06-09 DIAGNOSIS — I69311 Memory deficit following cerebral infarction: Secondary | ICD-10-CM | POA: Diagnosis not present

## 2022-06-09 DIAGNOSIS — B952 Enterococcus as the cause of diseases classified elsewhere: Secondary | ICD-10-CM | POA: Diagnosis not present

## 2022-06-09 DIAGNOSIS — N181 Chronic kidney disease, stage 1: Secondary | ICD-10-CM | POA: Diagnosis present

## 2022-06-09 DIAGNOSIS — K1379 Other lesions of oral mucosa: Secondary | ICD-10-CM | POA: Diagnosis not present

## 2022-06-09 DIAGNOSIS — R5381 Other malaise: Secondary | ICD-10-CM | POA: Diagnosis not present

## 2022-06-09 DIAGNOSIS — E538 Deficiency of other specified B group vitamins: Secondary | ICD-10-CM | POA: Diagnosis not present

## 2022-06-09 DIAGNOSIS — R531 Weakness: Secondary | ICD-10-CM | POA: Diagnosis present

## 2022-06-09 DIAGNOSIS — M79662 Pain in left lower leg: Secondary | ICD-10-CM | POA: Diagnosis not present

## 2022-06-09 DIAGNOSIS — E871 Hypo-osmolality and hyponatremia: Secondary | ICD-10-CM | POA: Diagnosis not present

## 2022-06-09 DIAGNOSIS — E1159 Type 2 diabetes mellitus with other circulatory complications: Secondary | ICD-10-CM | POA: Diagnosis not present

## 2022-06-09 DIAGNOSIS — R29898 Other symptoms and signs involving the musculoskeletal system: Secondary | ICD-10-CM | POA: Diagnosis not present

## 2022-06-09 DIAGNOSIS — H8110 Benign paroxysmal vertigo, unspecified ear: Secondary | ICD-10-CM | POA: Diagnosis present

## 2022-06-09 DIAGNOSIS — F411 Generalized anxiety disorder: Secondary | ICD-10-CM | POA: Diagnosis present

## 2022-06-09 DIAGNOSIS — M25551 Pain in right hip: Secondary | ICD-10-CM | POA: Diagnosis present

## 2022-06-09 DIAGNOSIS — I5033 Acute on chronic diastolic (congestive) heart failure: Secondary | ICD-10-CM | POA: Diagnosis present

## 2022-06-09 DIAGNOSIS — L03116 Cellulitis of left lower limb: Secondary | ICD-10-CM | POA: Diagnosis not present

## 2022-06-09 DIAGNOSIS — F5101 Primary insomnia: Secondary | ICD-10-CM | POA: Diagnosis not present

## 2022-06-09 DIAGNOSIS — Z951 Presence of aortocoronary bypass graft: Secondary | ICD-10-CM | POA: Diagnosis not present

## 2022-06-09 DIAGNOSIS — W19XXXA Unspecified fall, initial encounter: Secondary | ICD-10-CM | POA: Diagnosis present

## 2022-06-09 DIAGNOSIS — E11649 Type 2 diabetes mellitus with hypoglycemia without coma: Secondary | ICD-10-CM | POA: Diagnosis not present

## 2022-06-09 DIAGNOSIS — K7460 Unspecified cirrhosis of liver: Secondary | ICD-10-CM | POA: Diagnosis not present

## 2022-06-09 DIAGNOSIS — K7682 Hepatic encephalopathy: Secondary | ICD-10-CM | POA: Diagnosis not present

## 2022-06-09 DIAGNOSIS — M25552 Pain in left hip: Secondary | ICD-10-CM | POA: Diagnosis not present

## 2022-06-09 DIAGNOSIS — D696 Thrombocytopenia, unspecified: Secondary | ICD-10-CM | POA: Diagnosis not present

## 2022-06-09 DIAGNOSIS — I69322 Dysarthria following cerebral infarction: Secondary | ICD-10-CM | POA: Diagnosis not present

## 2022-06-09 DIAGNOSIS — I471 Supraventricular tachycardia, unspecified: Secondary | ICD-10-CM | POA: Diagnosis not present

## 2022-06-09 DIAGNOSIS — D638 Anemia in other chronic diseases classified elsewhere: Secondary | ICD-10-CM | POA: Diagnosis not present

## 2022-06-09 DIAGNOSIS — R42 Dizziness and giddiness: Secondary | ICD-10-CM | POA: Diagnosis not present

## 2022-06-09 DIAGNOSIS — I6389 Other cerebral infarction: Secondary | ICD-10-CM | POA: Diagnosis not present

## 2022-06-09 DIAGNOSIS — E8809 Other disorders of plasma-protein metabolism, not elsewhere classified: Secondary | ICD-10-CM | POA: Diagnosis present

## 2022-06-09 DIAGNOSIS — I2510 Atherosclerotic heart disease of native coronary artery without angina pectoris: Secondary | ICD-10-CM | POA: Diagnosis not present

## 2022-06-09 DIAGNOSIS — R300 Dysuria: Secondary | ICD-10-CM | POA: Diagnosis not present

## 2022-06-09 DIAGNOSIS — Z8616 Personal history of COVID-19: Secondary | ICD-10-CM | POA: Diagnosis not present

## 2022-06-09 DIAGNOSIS — I6359 Cerebral infarction due to unspecified occlusion or stenosis of other cerebral artery: Secondary | ICD-10-CM | POA: Diagnosis not present

## 2022-06-09 DIAGNOSIS — M810 Age-related osteoporosis without current pathological fracture: Secondary | ICD-10-CM | POA: Diagnosis not present

## 2022-06-09 DIAGNOSIS — G4700 Insomnia, unspecified: Secondary | ICD-10-CM | POA: Diagnosis present

## 2022-06-09 DIAGNOSIS — E1169 Type 2 diabetes mellitus with other specified complication: Secondary | ICD-10-CM | POA: Diagnosis not present

## 2022-06-09 DIAGNOSIS — I8501 Esophageal varices with bleeding: Secondary | ICD-10-CM | POA: Diagnosis not present

## 2022-06-09 DIAGNOSIS — I4719 Other supraventricular tachycardia: Secondary | ICD-10-CM | POA: Diagnosis present

## 2022-06-09 DIAGNOSIS — E785 Hyperlipidemia, unspecified: Secondary | ICD-10-CM | POA: Diagnosis not present

## 2022-06-09 DIAGNOSIS — R410 Disorientation, unspecified: Secondary | ICD-10-CM | POA: Diagnosis not present

## 2022-06-09 DIAGNOSIS — Z794 Long term (current) use of insulin: Secondary | ICD-10-CM | POA: Diagnosis not present

## 2022-06-09 DIAGNOSIS — N1831 Chronic kidney disease, stage 3a: Secondary | ICD-10-CM | POA: Diagnosis not present

## 2022-06-09 DIAGNOSIS — R262 Difficulty in walking, not elsewhere classified: Secondary | ICD-10-CM | POA: Diagnosis not present

## 2022-06-09 DIAGNOSIS — I480 Paroxysmal atrial fibrillation: Secondary | ICD-10-CM | POA: Diagnosis not present

## 2022-06-10 DIAGNOSIS — K7581 Nonalcoholic steatohepatitis (NASH): Secondary | ICD-10-CM | POA: Diagnosis not present

## 2022-06-10 DIAGNOSIS — I10 Essential (primary) hypertension: Secondary | ICD-10-CM | POA: Diagnosis not present

## 2022-06-10 DIAGNOSIS — E1169 Type 2 diabetes mellitus with other specified complication: Secondary | ICD-10-CM | POA: Diagnosis not present

## 2022-06-10 DIAGNOSIS — I480 Paroxysmal atrial fibrillation: Secondary | ICD-10-CM | POA: Diagnosis not present

## 2022-06-10 DIAGNOSIS — E785 Hyperlipidemia, unspecified: Secondary | ICD-10-CM | POA: Diagnosis not present

## 2022-06-10 DIAGNOSIS — N1831 Chronic kidney disease, stage 3a: Secondary | ICD-10-CM | POA: Diagnosis not present

## 2022-06-10 DIAGNOSIS — L03116 Cellulitis of left lower limb: Secondary | ICD-10-CM | POA: Diagnosis not present

## 2022-06-10 DIAGNOSIS — L89312 Pressure ulcer of right buttock, stage 2: Secondary | ICD-10-CM | POA: Diagnosis not present

## 2022-06-15 ENCOUNTER — Telehealth: Payer: Self-pay | Admitting: Pharmacy Technician

## 2022-06-15 DIAGNOSIS — E538 Deficiency of other specified B group vitamins: Secondary | ICD-10-CM | POA: Diagnosis not present

## 2022-06-15 DIAGNOSIS — Z596 Low income: Secondary | ICD-10-CM

## 2022-06-15 DIAGNOSIS — M25552 Pain in left hip: Secondary | ICD-10-CM | POA: Diagnosis not present

## 2022-06-15 DIAGNOSIS — R5381 Other malaise: Secondary | ICD-10-CM | POA: Diagnosis not present

## 2022-06-15 DIAGNOSIS — R262 Difficulty in walking, not elsewhere classified: Secondary | ICD-10-CM | POA: Diagnosis not present

## 2022-06-15 DIAGNOSIS — K1379 Other lesions of oral mucosa: Secondary | ICD-10-CM | POA: Diagnosis not present

## 2022-06-15 DIAGNOSIS — R300 Dysuria: Secondary | ICD-10-CM | POA: Diagnosis not present

## 2022-06-15 NOTE — Progress Notes (Signed)
[facility])                                            [place] Team    06/15/2022  Imagine Nest 1945-10-07 [id number]                                         [facility])                                            [place] Team     06/15/2022   Ebony Cargo 11/13/1962 [id number]   Care coordination call placed to [place] in regard to [place] application.   Spoke to [place] who informs patient is APPROVED 06/14/22-05/09/23.Medications will automatically fill and ship to provider's address on file based on last fill date in 2023 or if a new medication/patient allow up to 15 business days for processing and delivery. Patient may call [place] at [phone] if shipment has not arrived and patient does not have sufficient supply.   Shye Doty P. Sylis Ketchum, [place]  [phone]

## 2022-06-16 DIAGNOSIS — R600 Localized edema: Secondary | ICD-10-CM | POA: Diagnosis not present

## 2022-06-16 DIAGNOSIS — E1169 Type 2 diabetes mellitus with other specified complication: Secondary | ICD-10-CM | POA: Diagnosis not present

## 2022-06-16 DIAGNOSIS — R300 Dysuria: Secondary | ICD-10-CM | POA: Diagnosis not present

## 2022-06-16 DIAGNOSIS — L03116 Cellulitis of left lower limb: Secondary | ICD-10-CM | POA: Diagnosis not present

## 2022-06-16 DIAGNOSIS — E785 Hyperlipidemia, unspecified: Secondary | ICD-10-CM | POA: Diagnosis not present

## 2022-06-16 DIAGNOSIS — E559 Vitamin D deficiency, unspecified: Secondary | ICD-10-CM | POA: Diagnosis not present

## 2022-06-17 DIAGNOSIS — N1831 Chronic kidney disease, stage 3a: Secondary | ICD-10-CM | POA: Diagnosis not present

## 2022-06-17 DIAGNOSIS — I480 Paroxysmal atrial fibrillation: Secondary | ICD-10-CM | POA: Diagnosis not present

## 2022-06-17 DIAGNOSIS — E538 Deficiency of other specified B group vitamins: Secondary | ICD-10-CM | POA: Diagnosis not present

## 2022-06-17 DIAGNOSIS — K1379 Other lesions of oral mucosa: Secondary | ICD-10-CM | POA: Diagnosis not present

## 2022-06-17 DIAGNOSIS — I10 Essential (primary) hypertension: Secondary | ICD-10-CM | POA: Diagnosis not present

## 2022-06-17 DIAGNOSIS — R300 Dysuria: Secondary | ICD-10-CM | POA: Diagnosis not present

## 2022-06-17 DIAGNOSIS — K7581 Nonalcoholic steatohepatitis (NASH): Secondary | ICD-10-CM | POA: Diagnosis not present

## 2022-06-17 DIAGNOSIS — R600 Localized edema: Secondary | ICD-10-CM | POA: Diagnosis not present

## 2022-06-17 DIAGNOSIS — E1169 Type 2 diabetes mellitus with other specified complication: Secondary | ICD-10-CM | POA: Diagnosis not present

## 2022-06-20 DIAGNOSIS — M25552 Pain in left hip: Secondary | ICD-10-CM | POA: Diagnosis not present

## 2022-06-20 DIAGNOSIS — N390 Urinary tract infection, site not specified: Secondary | ICD-10-CM | POA: Diagnosis not present

## 2022-06-20 DIAGNOSIS — B952 Enterococcus as the cause of diseases classified elsewhere: Secondary | ICD-10-CM | POA: Diagnosis not present

## 2022-06-20 DIAGNOSIS — R29898 Other symptoms and signs involving the musculoskeletal system: Secondary | ICD-10-CM | POA: Diagnosis not present

## 2022-06-20 DIAGNOSIS — R0981 Nasal congestion: Secondary | ICD-10-CM | POA: Diagnosis not present

## 2022-06-21 DIAGNOSIS — M25552 Pain in left hip: Secondary | ICD-10-CM | POA: Diagnosis not present

## 2022-06-21 DIAGNOSIS — N390 Urinary tract infection, site not specified: Secondary | ICD-10-CM | POA: Diagnosis not present

## 2022-06-22 DIAGNOSIS — R600 Localized edema: Secondary | ICD-10-CM | POA: Diagnosis not present

## 2022-06-22 DIAGNOSIS — K7581 Nonalcoholic steatohepatitis (NASH): Secondary | ICD-10-CM | POA: Diagnosis not present

## 2022-06-22 DIAGNOSIS — R262 Difficulty in walking, not elsewhere classified: Secondary | ICD-10-CM | POA: Diagnosis not present

## 2022-06-22 DIAGNOSIS — M25552 Pain in left hip: Secondary | ICD-10-CM | POA: Diagnosis not present

## 2022-06-22 DIAGNOSIS — E876 Hypokalemia: Secondary | ICD-10-CM | POA: Diagnosis not present

## 2022-06-22 DIAGNOSIS — R5381 Other malaise: Secondary | ICD-10-CM | POA: Diagnosis not present

## 2022-06-23 DIAGNOSIS — E1169 Type 2 diabetes mellitus with other specified complication: Secondary | ICD-10-CM | POA: Diagnosis not present

## 2022-06-23 DIAGNOSIS — E785 Hyperlipidemia, unspecified: Secondary | ICD-10-CM | POA: Diagnosis not present

## 2022-06-23 DIAGNOSIS — R29898 Other symptoms and signs involving the musculoskeletal system: Secondary | ICD-10-CM | POA: Diagnosis not present

## 2022-06-23 DIAGNOSIS — L03116 Cellulitis of left lower limb: Secondary | ICD-10-CM | POA: Diagnosis not present

## 2022-06-23 DIAGNOSIS — R600 Localized edema: Secondary | ICD-10-CM | POA: Diagnosis not present

## 2022-06-23 DIAGNOSIS — E559 Vitamin D deficiency, unspecified: Secondary | ICD-10-CM | POA: Diagnosis not present

## 2022-06-23 DIAGNOSIS — M25552 Pain in left hip: Secondary | ICD-10-CM | POA: Diagnosis not present

## 2022-06-24 DIAGNOSIS — R600 Localized edema: Secondary | ICD-10-CM | POA: Diagnosis not present

## 2022-06-28 DIAGNOSIS — R600 Localized edema: Secondary | ICD-10-CM | POA: Diagnosis not present

## 2022-06-28 DIAGNOSIS — R29898 Other symptoms and signs involving the musculoskeletal system: Secondary | ICD-10-CM | POA: Diagnosis not present

## 2022-06-29 DIAGNOSIS — M25552 Pain in left hip: Secondary | ICD-10-CM | POA: Diagnosis not present

## 2022-06-29 DIAGNOSIS — R262 Difficulty in walking, not elsewhere classified: Secondary | ICD-10-CM | POA: Diagnosis not present

## 2022-06-29 DIAGNOSIS — R5381 Other malaise: Secondary | ICD-10-CM | POA: Diagnosis not present

## 2022-06-30 ENCOUNTER — Emergency Department (HOSPITAL_COMMUNITY): Payer: Medicare Other

## 2022-06-30 ENCOUNTER — Inpatient Hospital Stay (HOSPITAL_COMMUNITY)
Admission: EM | Admit: 2022-06-30 | Discharge: 2022-07-04 | DRG: 291 | Disposition: A | Discharge status: Skilled Nursing Facility | Payer: Medicare Other | Source: Skilled Nursing Facility | Attending: Internal Medicine | Admitting: Internal Medicine

## 2022-06-30 ENCOUNTER — Encounter (HOSPITAL_COMMUNITY): Payer: Self-pay

## 2022-06-30 ENCOUNTER — Other Ambulatory Visit: Payer: Self-pay

## 2022-06-30 DIAGNOSIS — E785 Hyperlipidemia, unspecified: Secondary | ICD-10-CM | POA: Diagnosis present

## 2022-06-30 DIAGNOSIS — K7581 Nonalcoholic steatohepatitis (NASH): Secondary | ICD-10-CM | POA: Diagnosis not present

## 2022-06-30 DIAGNOSIS — Z87442 Personal history of urinary calculi: Secondary | ICD-10-CM

## 2022-06-30 DIAGNOSIS — I5032 Chronic diastolic (congestive) heart failure: Secondary | ICD-10-CM | POA: Diagnosis present

## 2022-06-30 DIAGNOSIS — I4719 Other supraventricular tachycardia: Secondary | ICD-10-CM | POA: Diagnosis not present

## 2022-06-30 DIAGNOSIS — M810 Age-related osteoporosis without current pathological fracture: Secondary | ICD-10-CM | POA: Diagnosis present

## 2022-06-30 DIAGNOSIS — E11649 Type 2 diabetes mellitus with hypoglycemia without coma: Secondary | ICD-10-CM | POA: Diagnosis not present

## 2022-06-30 DIAGNOSIS — R42 Dizziness and giddiness: Secondary | ICD-10-CM | POA: Diagnosis not present

## 2022-06-30 DIAGNOSIS — M6281 Muscle weakness (generalized): Secondary | ICD-10-CM | POA: Diagnosis present

## 2022-06-30 DIAGNOSIS — N181 Chronic kidney disease, stage 1: Secondary | ICD-10-CM | POA: Diagnosis present

## 2022-06-30 DIAGNOSIS — Z8616 Personal history of COVID-19: Secondary | ICD-10-CM | POA: Diagnosis not present

## 2022-06-30 DIAGNOSIS — E1122 Type 2 diabetes mellitus with diabetic chronic kidney disease: Secondary | ICD-10-CM | POA: Diagnosis not present

## 2022-06-30 DIAGNOSIS — I130 Hypertensive heart and chronic kidney disease with heart failure and stage 1 through stage 4 chronic kidney disease, or unspecified chronic kidney disease: Secondary | ICD-10-CM | POA: Diagnosis not present

## 2022-06-30 DIAGNOSIS — I480 Paroxysmal atrial fibrillation: Secondary | ICD-10-CM | POA: Diagnosis not present

## 2022-06-30 DIAGNOSIS — E876 Hypokalemia: Secondary | ICD-10-CM | POA: Diagnosis present

## 2022-06-30 DIAGNOSIS — I2489 Other forms of acute ischemic heart disease: Secondary | ICD-10-CM | POA: Diagnosis not present

## 2022-06-30 DIAGNOSIS — R600 Localized edema: Secondary | ICD-10-CM | POA: Insufficient documentation

## 2022-06-30 DIAGNOSIS — I471 Supraventricular tachycardia, unspecified: Secondary | ICD-10-CM | POA: Diagnosis not present

## 2022-06-30 DIAGNOSIS — Z8249 Family history of ischemic heart disease and other diseases of the circulatory system: Secondary | ICD-10-CM

## 2022-06-30 DIAGNOSIS — R410 Disorientation, unspecified: Secondary | ICD-10-CM | POA: Diagnosis not present

## 2022-06-30 DIAGNOSIS — Z91040 Latex allergy status: Secondary | ICD-10-CM

## 2022-06-30 DIAGNOSIS — E1142 Type 2 diabetes mellitus with diabetic polyneuropathy: Secondary | ICD-10-CM | POA: Diagnosis present

## 2022-06-30 DIAGNOSIS — R531 Weakness: Secondary | ICD-10-CM

## 2022-06-30 DIAGNOSIS — R110 Nausea: Secondary | ICD-10-CM | POA: Diagnosis not present

## 2022-06-30 DIAGNOSIS — M79662 Pain in left lower leg: Secondary | ICD-10-CM | POA: Diagnosis not present

## 2022-06-30 DIAGNOSIS — Z7401 Bed confinement status: Secondary | ICD-10-CM | POA: Diagnosis not present

## 2022-06-30 DIAGNOSIS — F411 Generalized anxiety disorder: Secondary | ICD-10-CM | POA: Insufficient documentation

## 2022-06-30 DIAGNOSIS — E1159 Type 2 diabetes mellitus with other circulatory complications: Secondary | ICD-10-CM | POA: Diagnosis not present

## 2022-06-30 DIAGNOSIS — M79661 Pain in right lower leg: Secondary | ICD-10-CM | POA: Diagnosis present

## 2022-06-30 DIAGNOSIS — Z951 Presence of aortocoronary bypass graft: Secondary | ICD-10-CM

## 2022-06-30 DIAGNOSIS — E8809 Other disorders of plasma-protein metabolism, not elsewhere classified: Secondary | ICD-10-CM | POA: Diagnosis not present

## 2022-06-30 DIAGNOSIS — Z741 Need for assistance with personal care: Secondary | ICD-10-CM | POA: Diagnosis present

## 2022-06-30 DIAGNOSIS — Z887 Allergy status to serum and vaccine status: Secondary | ICD-10-CM

## 2022-06-30 DIAGNOSIS — I2510 Atherosclerotic heart disease of native coronary artery without angina pectoris: Secondary | ICD-10-CM | POA: Diagnosis present

## 2022-06-30 DIAGNOSIS — K219 Gastro-esophageal reflux disease without esophagitis: Secondary | ICD-10-CM | POA: Diagnosis not present

## 2022-06-30 DIAGNOSIS — W19XXXA Unspecified fall, initial encounter: Secondary | ICD-10-CM | POA: Diagnosis present

## 2022-06-30 DIAGNOSIS — K7460 Unspecified cirrhosis of liver: Secondary | ICD-10-CM | POA: Diagnosis present

## 2022-06-30 DIAGNOSIS — Z974 Presence of external hearing-aid: Secondary | ICD-10-CM

## 2022-06-30 DIAGNOSIS — Z803 Family history of malignant neoplasm of breast: Secondary | ICD-10-CM

## 2022-06-30 DIAGNOSIS — I10 Essential (primary) hypertension: Secondary | ICD-10-CM | POA: Diagnosis present

## 2022-06-30 DIAGNOSIS — M7989 Other specified soft tissue disorders: Secondary | ICD-10-CM | POA: Diagnosis not present

## 2022-06-30 DIAGNOSIS — M25552 Pain in left hip: Secondary | ICD-10-CM | POA: Insufficient documentation

## 2022-06-30 DIAGNOSIS — H5509 Other forms of nystagmus: Secondary | ICD-10-CM | POA: Diagnosis present

## 2022-06-30 DIAGNOSIS — I252 Old myocardial infarction: Secondary | ICD-10-CM

## 2022-06-30 DIAGNOSIS — L03116 Cellulitis of left lower limb: Secondary | ICD-10-CM | POA: Diagnosis not present

## 2022-06-30 DIAGNOSIS — I5033 Acute on chronic diastolic (congestive) heart failure: Secondary | ICD-10-CM | POA: Diagnosis not present

## 2022-06-30 DIAGNOSIS — Z90710 Acquired absence of both cervix and uterus: Secondary | ICD-10-CM

## 2022-06-30 DIAGNOSIS — G4700 Insomnia, unspecified: Secondary | ICD-10-CM | POA: Diagnosis present

## 2022-06-30 DIAGNOSIS — Z8261 Family history of arthritis: Secondary | ICD-10-CM

## 2022-06-30 DIAGNOSIS — H8110 Benign paroxysmal vertigo, unspecified ear: Secondary | ICD-10-CM | POA: Diagnosis present

## 2022-06-30 DIAGNOSIS — E119 Type 2 diabetes mellitus without complications: Secondary | ICD-10-CM

## 2022-06-30 DIAGNOSIS — Z800 Family history of malignant neoplasm of digestive organs: Secondary | ICD-10-CM

## 2022-06-30 DIAGNOSIS — Z79899 Other long term (current) drug therapy: Secondary | ICD-10-CM

## 2022-06-30 DIAGNOSIS — Z91018 Allergy to other foods: Secondary | ICD-10-CM

## 2022-06-30 DIAGNOSIS — M25551 Pain in right hip: Secondary | ICD-10-CM | POA: Diagnosis present

## 2022-06-30 DIAGNOSIS — Z86010 Personal history of colonic polyps: Secondary | ICD-10-CM

## 2022-06-30 DIAGNOSIS — Z794 Long term (current) use of insulin: Secondary | ICD-10-CM | POA: Diagnosis not present

## 2022-06-30 DIAGNOSIS — Z801 Family history of malignant neoplasm of trachea, bronchus and lung: Secondary | ICD-10-CM

## 2022-06-30 DIAGNOSIS — I4891 Unspecified atrial fibrillation: Secondary | ICD-10-CM | POA: Diagnosis present

## 2022-06-30 DIAGNOSIS — F419 Anxiety disorder, unspecified: Secondary | ICD-10-CM | POA: Diagnosis present

## 2022-06-30 DIAGNOSIS — E7849 Other hyperlipidemia: Secondary | ICD-10-CM | POA: Diagnosis not present

## 2022-06-30 DIAGNOSIS — Z807 Family history of other malignant neoplasms of lymphoid, hematopoietic and related tissues: Secondary | ICD-10-CM

## 2022-06-30 DIAGNOSIS — I491 Atrial premature depolarization: Secondary | ICD-10-CM | POA: Diagnosis not present

## 2022-06-30 DIAGNOSIS — F5101 Primary insomnia: Secondary | ICD-10-CM | POA: Diagnosis not present

## 2022-06-30 LAB — COMPREHENSIVE METABOLIC PANEL
ALT: 33 U/L (ref 0–44)
AST: 61 U/L — ABNORMAL HIGH (ref 15–41)
Albumin: 2 g/dL — ABNORMAL LOW (ref 3.5–5.0)
Alkaline Phosphatase: 125 U/L (ref 38–126)
Anion gap: 10 (ref 5–15)
BUN: 10 mg/dL (ref 8–23)
CO2: 21 mmol/L — ABNORMAL LOW (ref 22–32)
Calcium: 8.4 mg/dL — ABNORMAL LOW (ref 8.9–10.3)
Chloride: 105 mmol/L (ref 98–111)
Creatinine, Ser: 0.74 mg/dL (ref 0.44–1.00)
GFR, Estimated: >60 mL/min (ref >60)
Glucose, Bld: 178 mg/dL — ABNORMAL HIGH (ref 70–99)
Potassium: 3.6 mmol/L (ref 3.5–5.1)
Sodium: 136 mmol/L (ref 135–145)
Total Bilirubin: 2.2 mg/dL — ABNORMAL HIGH (ref 0.3–1.2)
Total Protein: 6.7 g/dL (ref 6.5–8.1)

## 2022-06-30 LAB — URINALYSIS, W/ REFLEX TO CULTURE (INFECTION SUSPECTED)
Bacteria, UA: NONE SEEN
Bilirubin Urine: NEGATIVE
Glucose, UA: >=500 mg/dL — AB
Ketones, ur: 5 mg/dL — AB
Leukocytes,Ua: NEGATIVE
Nitrite: NEGATIVE
Protein, ur: NEGATIVE mg/dL
Specific Gravity, Urine: 1.039 — ABNORMAL HIGH (ref 1.005–1.030)
pH: 5 (ref 5.0–8.0)

## 2022-06-30 LAB — CBC WITH DIFFERENTIAL/PLATELET
Abs Immature Granulocytes: 0.06 10*3/uL (ref 0.00–0.07)
Basophils Absolute: 0 10*3/uL (ref 0.0–0.1)
Basophils Relative: 0 %
Eosinophils Absolute: 0 10*3/uL (ref 0.0–0.5)
Eosinophils Relative: 0 %
HCT: 34.6 % — ABNORMAL LOW (ref 36.0–46.0)
Hemoglobin: 11.6 g/dL — ABNORMAL LOW (ref 12.0–15.0)
Immature Granulocytes: 1 %
Lymphocytes Relative: 4 %
Lymphs Abs: 0.5 10*3/uL — ABNORMAL LOW (ref 0.7–4.0)
MCH: 31.4 pg (ref 26.0–34.0)
MCHC: 33.5 g/dL (ref 30.0–36.0)
MCV: 93.5 fL (ref 80.0–100.0)
Monocytes Absolute: 0.9 10*3/uL (ref 0.1–1.0)
Monocytes Relative: 7 %
Neutro Abs: 10.5 10*3/uL — ABNORMAL HIGH (ref 1.7–7.7)
Neutrophils Relative %: 88 %
Platelets: 166 10*3/uL (ref 150–400)
RBC: 3.7 MIL/uL — ABNORMAL LOW (ref 3.87–5.11)
RDW: 17.1 % — ABNORMAL HIGH (ref 11.5–15.5)
WBC: 11.9 10*3/uL — ABNORMAL HIGH (ref 4.0–10.5)
nRBC: 0 % (ref 0.0–0.2)

## 2022-06-30 LAB — TROPONIN I (HIGH SENSITIVITY)
Troponin I (High Sensitivity): 20 ng/L — ABNORMAL HIGH (ref <18)
Troponin I (High Sensitivity): 25 ng/L — ABNORMAL HIGH (ref <18)

## 2022-06-30 LAB — BRAIN NATRIURETIC PEPTIDE: B Natriuretic Peptide: 630.2 pg/mL — ABNORMAL HIGH (ref 0.0–100.0)

## 2022-06-30 LAB — AMMONIA: Ammonia: 24 umol/L (ref 9–35)

## 2022-06-30 MED ORDER — MECLIZINE HCL 25 MG PO TABS
25.0000 mg | ORAL_TABLET | Freq: Once | ORAL | Status: AC
  Administered 2022-06-30: 25 mg via ORAL
  Filled 2022-06-30: qty 1

## 2022-06-30 NOTE — ED Triage Notes (Signed)
BIBA from [facility] with c/o of dizziness and nausea since approx 3pm.  Sudden onset while sitting outside in [facility] with family

## 2022-06-30 NOTE — ED Provider Notes (Signed)
[place] Provider Note   CSN: [id number] Arrival date & time: 06/30/22  1647     History Chief Complaint  Patient presents with   Dizziness    [name] is a 77 y.o. female with h/o CVA, cirrhosis, type 2 diabetes, A-fib not on thinners due to GI bleeding risk, CHF, thrombocytopenia, anemia, hypertension, hyperlipidemia presents emerged from today for evaluation of dizziness.  Patient reports that she was sitting in a wheelchair talking to family numbers when she had the sudden room spinning sensation and felt extremely nauseous.  She reports that she still feels that room sitting sensation now.  She denies any chest pain or any shortness of breath.  Denies any abdominal pain, dysuria, hematuria, trouble talking, weakness on one side of her body.   Dizziness Associated symptoms: nausea   Associated symptoms: no chest pain, no headaches, no shortness of breath, no vomiting and no weakness        Home Medications Prior to Admission medications   Medication Sig Start Date End Date Taking? Authorizing Provider  Biotin 10000 MCG TABS Take 10,000 mcg by mouth daily.    [provider]  Calcium Carb-Cholecalciferol (CALCIUM 600 + D PO) Take 1 tablet by mouth in the morning and at bedtime.    [provider]  Cholecalciferol (VITAMIN D-3) 25 MCG (1000 UT) CAPS Take 1,000 Units by mouth daily.    [provider]  Cyanocobalamin (VITAMIN B 12 PO) Take 1,000 mcg by mouth daily.    [provider]  empagliflozin (JARDIANCE) 25 MG TABS tablet Take 1 tablet (25 mg total) by mouth daily. 03/14/22   [name], MD  ezetimibe (ZETIA) 10 MG tablet Take 1 tablet (10 mg total) by mouth daily. 02/09/22   [name], MD  fluticasone (FLONASE) 50 MCG/ACT nasal spray Place 1 spray into both nostrils daily as needed for allergies or rhinitis.    [provider]  furosemide (LASIX) 20 MG  tablet TAKE 1 TABLET(20 MG) BY MOUTH DAILY 03/16/22   Josue Hector, MD  glucose blood test strip One Touch Ultra Blue test strips. Use to test blood sugar three times daily. Dx: E11.65 02/09/22   [name], MD  HYDROcodone-acetaminophen (NORCO) 5-325 MG tablet Take 1 tablet by mouth every 6 (six) hours as needed for moderate pain. 05/18/22   [name], MD  insulin aspart (NOVOLOG) 100 UNIT/ML injection Inject 4-6 Units into the skin 3 (three) times daily before meals. Take 4 units before breakfast and 6 uints before lunch/supper 06/02/22   Medina-Vargas, Monina C, NP  insulin degludec (TRESIBA FLEXTOUCH) 100 UNIT/ML FlexTouch Pen Inject 25 Units into the skin at bedtime. 05/18/22   [name], MD  Insulin Pen Needle (B-D ULTRAFINE III SHORT PEN) 31G X 8 MM MISC Use to give Insulin injections daily. Dx: E11.65 10/11/21   [name], MD  Insulin Syringe-Needle U-100 (INSULIN SYRINGE 1CC/31GX5/16") 31G X 5/16" 1 ML MISC USE AS DIRECTED TO INJECT LEVIMIR 07/29/20   [name], MD  lactulose [facility]) 10 GM/15ML solution TAKE 30 ML BY MOUTH EVERY DAY 11/26/21   [name], MD  LORazepam (ATIVAN) 0.5 MG tablet Take 1 tablet (0.5 mg total) by mouth at bedtime as needed for anxiety. 03/24/22   Medina-Vargas, Monina C, NP  MAGNESIUM PO Take 500 mg by mouth daily.     [provider]  metoprolol tartrate (LOPRESSOR) 25 MG  tablet Take 1 tablet (25 mg total) by mouth as needed (use as needed for HR greater than 110). 08/21/19   Tommie Raymond, NP  Multiple Vitamins-Minerals (MULTIVITAMIN WITH MINERALS) tablet Take 1 tablet by mouth daily.    [provider]  pantoprazole (PROTONIX) 40 MG tablet TAKE 1 TABLET(40 MG) BY MOUTH DAILY 10/25/21   [name], MD  Polyethyl Glycol-Propyl Glycol (SYSTANE OP) Place 1 drop into both eyes at bedtime.    [provider]  Probiotic Product (PROBIOTIC DAILY PO) Take 1 capsule by mouth daily. Digestive  Advantage Probiotic    [provider]  XIFAXAN 550 MG TABS tablet Take 1 tablet (550 mg total) by mouth 2 (two) times daily. 06/30/21   [name], MD      Allergies    Kiwi extract, Tdap [tetanus-diphth-acell pertussis], Latex, Statins, and Tramadol    Review of Systems   Review of Systems  Constitutional:  Negative for chills and fever.  Eyes:  Negative for visual disturbance.  Respiratory:  Negative for shortness of breath.   Cardiovascular:  Negative for chest pain.  Gastrointestinal:  Positive for nausea. Negative for vomiting.  Genitourinary:  Negative for dysuria and hematuria.  Neurological:  Positive for dizziness. Negative for syncope, weakness and headaches.    Physical Exam Updated Vital Signs BP 139/65   Pulse 86   Temp 97.9 F (36.6 C) (Oral)   Resp 17   Ht '5\' 3"'$  (1.6 m)   Wt 64 kg   LMP  (LMP Unknown)   SpO2 96%   BMI 24.99 kg/m  Physical Exam Vitals and nursing note reviewed.  Constitutional:      Appearance: She is not toxic-appearing.  HENT:     Ears:     Comments: Bilateral hearing aids present    Mouth/Throat:     Mouth: Mucous membranes are moist.  Eyes:     General: Scleral icterus present.     Comments: Mild scleral icterus present. Unilateral horizontal nystagmus with leftward gaze. Nystagmus relieved with gaze fixation.  Pupils are equal round and reactive. Patient reports dizziness is worse with testing EOMI.  Neck:     Comments: Some limited ROM 2/2 patient's chronic neck pain. No meningeal signs.  Cardiovascular:     Rate and Rhythm: Normal rate.  Pulmonary:     Effort: Pulmonary effort is normal. No respiratory distress.  Abdominal:     Palpations: Abdomen is soft.     Tenderness: There is no abdominal tenderness. There is no guarding or rebound.  Musculoskeletal:     Right lower leg: Edema present.     Left lower leg: Edema present.     Comments: 2+ pitting edema. More pain to palpation of the left calf with some  redness. Compartments are pliable. No wounds noted.   Skin:    General: Skin is warm and dry.  Neurological:     General: No focal deficit present.     Mental Status: She is alert and oriented to person, place, and time.     GCS: GCS eye subscore is 4. GCS verbal subscore is 5. GCS motor subscore is 6.     Cranial Nerves: No dysarthria or facial asymmetry.     Sensory: No sensory deficit.     Motor: No weakness or pronator drift.     Coordination: Finger-Nose-Finger Test normal.     Comments: GCS 15.  Patient alert and oriented x 3.  Other than the horizontal nystagmus seen,  cranial nerves are intact.  She is answering questions appropriately with appropriate speech.  No facial asymmetry noted.  She reports sensations intact throughout.  Strength is equal in patient's upper and lower bilateral extremities.  No pronator drift.  Normal finger-nose-finger with both upper extremities.     ED Results / Procedures / Treatments   Labs (all labs ordered are listed, but only abnormal results are displayed) Labs Reviewed  CBC WITH DIFFERENTIAL/PLATELET - Abnormal; Notable for the following components:      Result Value   WBC 11.9 (*)    RBC 3.70 (*)    Hemoglobin 11.6 (*)    HCT 34.6 (*)    RDW 17.1 (*)    Neutro Abs 10.5 (*)    Lymphs Abs 0.5 (*)    All other components within normal limits  COMPREHENSIVE METABOLIC PANEL - Abnormal; Notable for the following components:   CO2 21 (*)    Glucose, Bld 178 (*)    Calcium 8.4 (*)    Albumin 2.0 (*)    AST 61 (*)    Total Bilirubin 2.2 (*)    All other components within normal limits  BRAIN NATRIURETIC PEPTIDE - Abnormal; Notable for the following components:   B Natriuretic Peptide 630.2 (*)    All other components within normal limits  TROPONIN I (HIGH SENSITIVITY) - Abnormal; Notable for the following components:   Troponin I (High Sensitivity) 20 (*)    All other components within normal limits  AMMONIA  URINALYSIS, W/ REFLEX TO  CULTURE (INFECTION SUSPECTED)  CBG MONITORING, ED  TROPONIN I (HIGH SENSITIVITY)    EKG EKG Interpretation  Date/Time:  Thursday June 30 2022 16:56:40 EST Ventricular Rate:  91 PR Interval:  144 QRS Duration: 106 QT Interval:  398 QTC Calculation: 490 R Axis:   -18 Text Interpretation: Sinus rhythm Probable left ventricular hypertrophy Nonspecific T abnormalities, lateral leads Borderline prolonged QT interval Confirmed by Octaviano Glow [phone]) on 06/30/2022 5:37:02 PM  Radiology MR BRAIN WO CONTRAST  Result Date: 06/30/2022 CLINICAL DATA:  Horizontal nystagmus.  Dizziness and nausea. EXAM: MRI HEAD WITHOUT CONTRAST TECHNIQUE: Multiplanar, multiecho pulse sequences of the brain and surrounding structures were obtained without intravenous contrast. COMPARISON:  CT Head 05/28/22 FINDINGS: Brain: No acute infarction, hemorrhage, hydrocephalus, extra-axial collection or mass lesion. Sequela of moderate chronic microvascular ischemic change. There is T2/FLAIR hyperintense signal in the subcortical white matter in the bilateral parietal lobes, which is also favored to represent sequela of chronic ischemic change. Vascular: Right A1 flow void is not visualized, possibly congenital Skull and upper cervical spine: Normal marrow signal. Sinuses/Orbits: Trace right mastoid effusion. Mild mucosal thickening bilateral ethmoid air cells. Other: None. IMPRESSION: No acute intracranial process. Electronically Signed   By: Marin Roberts M.D.   On: 06/30/2022 20:03    Procedures Procedures   Medications Ordered in ED Medications  meclizine (ANTIVERT) tablet 25 mg (25 mg Oral Given 06/30/22 1744)    ED Course/ Medical Decision Making/ A&P Clinical Course as of 06/30/22 2041  Thu Jun 30, 2022  1750 This is a 78 old female with a history of paroxysmal A-fib not on anticoagulation due to GI bleeding concerns, recent hospitalization for embolic stroke 2 months ago, presenting to the ED with acute onset  of vertigo.  Patient reports that she was at rehab or sitting on a chair and began to have sudden room spinning sensation.  She denies headache, numbness or weakness of the arms or legs, difficulty with speech,  or any other strokelike symptoms.  She denies prior history of vertigo.  On exam her neurological exam is benign, no difficulty with finger-nose.  She does have notable unilateral nystagmus with leftward gaze, test of skew consistent with peripheral vertigo, but equivocal head impulse testing.  Given her multiple involved stroke risk factors and think an MRI scan would be reasonable to evaluate for posterior circulation infarct.  Her exam is more consistent with peripheral vertigo; if MRI is negative I would anticipate treatment for peripheral vertigo.  Meclizine was ordered.  Will reassess her after her workup. [MT]    Clinical Course User Index [MT] Trifan, Carola Rhine, MD                           Medical Decision Making Amount and/or Complexity of Data Reviewed Labs: ordered. Radiology: ordered.  Risk Prescription drug management. Decision regarding hospitalization.   77 year old female presents emerged from today for evaluation of sudden onset of dizziness with nausea.  Differential diagnosis includes was limited to stroke, TIA, peripheral vertigo, ACS, otitis media, dissection, central vertigo. Vital signs are unremarkable. Physical exam as noted above. I do not appreciate any focal neuro deficit although the patient does have unilateral nystagmus with a leftward gaze.   On previous chart evaluation, it appears that the patient was seen and admitted at [place] on a 06-05-2022 for strokes, encephalopathy, hyponatremia.  She is currently residing at [facility].  Attending assessed at bedside. Labs and MRI brain without contrast ordered. Meclizine ordered.   EKG reviewed and interpreted by attending and read as Sinus rhythm Probable left ventricular hypertrophy Nonspecific T  abnormalities, lateral leads Borderline prolonged QT interval.  On reevaluation, patient is nystagmus still present although does appear slightly improved.  She reports that she feels mildly better whenever she is lying still.  Still no chest pain or shortness of breath.  I independently reviewed and interpreted the patient's labs.  Urine is concentrated with some glucose and hemoglobin and 5 ketones otherwise no signs of infection.  CBC does show leukocytosis of 11.9 with a left shift although appears that patient always has her white blood cell count around this level.  Mild anemia at 11.6 this is new for her baseline around 13.  She denies any dark or melanotic stools.  CMP shows glucose of 178 of the patient known diabetic.  Bicarb mildly decreased at 21 however normal anion gap.  Total bili is elevated at 2.2 although is improved from patient's previous.  AST slightly elevated at 61.  Albumin decreased at 2.0.  Calcium also decreased at 8.4.  No other electrolyte or LFT abnormality.  Ammonia within normal limits.  BNP elevated at 630.2.  Troponin at 20 with repeat of 25.  MRI shows No acute intracranial process.  For the patient's left lower leg slight redness and tender to touch.  She reports it has been worked up.  She reports that they tested it for blood clot and for infection and they can find out what is wrong although it has been like this "for a long time".  I discussed this with the hospitalist.  Attempted to ambulate the patient at bedside. From moving to a lying to sitting position, the patient has significant dizziness even after sitting for a few moments. Attempted to stand, however she was unsteady and was not able to. Her nystagmus is still present.   Given her inability to go from a lying to  sitting position, much less stand, without getting dizzy, will need admission. Will admit to [facility].   I discussed this case with my attending physician who cosigned this note  including patient's presenting symptoms, physical exam, and planned diagnostics and interventions. Attending physician stated agreement with plan or made changes to plan which were implemented.   Attending physician assessed patient at bedside.  Final Clinical Impression(s) / ED Diagnoses Final diagnoses:  Vertigo    Rx / DC Orders ED Discharge Orders     None         Sherrell Puller, PA-C 07/01/22 0132    Wyvonnia Dusky, MD 07/01/22 1416

## 2022-06-30 NOTE — ED Notes (Signed)
Pt to MRI

## 2022-07-01 ENCOUNTER — Observation Stay (HOSPITAL_COMMUNITY): Payer: Medicare Other

## 2022-07-01 ENCOUNTER — Encounter (HOSPITAL_COMMUNITY): Payer: Self-pay | Admitting: Internal Medicine

## 2022-07-01 DIAGNOSIS — K7460 Unspecified cirrhosis of liver: Secondary | ICD-10-CM | POA: Diagnosis present

## 2022-07-01 DIAGNOSIS — N181 Chronic kidney disease, stage 1: Secondary | ICD-10-CM | POA: Diagnosis present

## 2022-07-01 DIAGNOSIS — Z794 Long term (current) use of insulin: Secondary | ICD-10-CM | POA: Diagnosis not present

## 2022-07-01 DIAGNOSIS — E1142 Type 2 diabetes mellitus with diabetic polyneuropathy: Secondary | ICD-10-CM | POA: Diagnosis present

## 2022-07-01 DIAGNOSIS — F411 Generalized anxiety disorder: Secondary | ICD-10-CM

## 2022-07-01 DIAGNOSIS — E7849 Other hyperlipidemia: Secondary | ICD-10-CM | POA: Diagnosis not present

## 2022-07-01 DIAGNOSIS — M810 Age-related osteoporosis without current pathological fracture: Secondary | ICD-10-CM | POA: Diagnosis present

## 2022-07-01 DIAGNOSIS — E1159 Type 2 diabetes mellitus with other circulatory complications: Secondary | ICD-10-CM | POA: Diagnosis not present

## 2022-07-01 DIAGNOSIS — M79662 Pain in left lower leg: Secondary | ICD-10-CM

## 2022-07-01 DIAGNOSIS — I2510 Atherosclerotic heart disease of native coronary artery without angina pectoris: Secondary | ICD-10-CM | POA: Diagnosis present

## 2022-07-01 DIAGNOSIS — E785 Hyperlipidemia, unspecified: Secondary | ICD-10-CM | POA: Diagnosis present

## 2022-07-01 DIAGNOSIS — M25552 Pain in left hip: Secondary | ICD-10-CM | POA: Diagnosis not present

## 2022-07-01 DIAGNOSIS — R110 Nausea: Secondary | ICD-10-CM | POA: Diagnosis not present

## 2022-07-01 DIAGNOSIS — K7581 Nonalcoholic steatohepatitis (NASH): Secondary | ICD-10-CM

## 2022-07-01 DIAGNOSIS — I4719 Other supraventricular tachycardia: Secondary | ICD-10-CM | POA: Diagnosis present

## 2022-07-01 DIAGNOSIS — R531 Weakness: Secondary | ICD-10-CM

## 2022-07-01 DIAGNOSIS — I480 Paroxysmal atrial fibrillation: Secondary | ICD-10-CM | POA: Diagnosis present

## 2022-07-01 DIAGNOSIS — M7989 Other specified soft tissue disorders: Secondary | ICD-10-CM | POA: Diagnosis not present

## 2022-07-01 DIAGNOSIS — I5032 Chronic diastolic (congestive) heart failure: Secondary | ICD-10-CM | POA: Diagnosis not present

## 2022-07-01 DIAGNOSIS — I5033 Acute on chronic diastolic (congestive) heart failure: Secondary | ICD-10-CM

## 2022-07-01 DIAGNOSIS — H8110 Benign paroxysmal vertigo, unspecified ear: Secondary | ICD-10-CM | POA: Diagnosis present

## 2022-07-01 DIAGNOSIS — Z741 Need for assistance with personal care: Secondary | ICD-10-CM | POA: Diagnosis present

## 2022-07-01 DIAGNOSIS — R600 Localized edema: Secondary | ICD-10-CM | POA: Insufficient documentation

## 2022-07-01 DIAGNOSIS — M25551 Pain in right hip: Secondary | ICD-10-CM | POA: Diagnosis present

## 2022-07-01 DIAGNOSIS — Z8616 Personal history of COVID-19: Secondary | ICD-10-CM | POA: Diagnosis not present

## 2022-07-01 DIAGNOSIS — I471 Supraventricular tachycardia, unspecified: Secondary | ICD-10-CM | POA: Diagnosis not present

## 2022-07-01 DIAGNOSIS — G4700 Insomnia, unspecified: Secondary | ICD-10-CM | POA: Diagnosis present

## 2022-07-01 DIAGNOSIS — W19XXXA Unspecified fall, initial encounter: Secondary | ICD-10-CM | POA: Diagnosis present

## 2022-07-01 DIAGNOSIS — E8809 Other disorders of plasma-protein metabolism, not elsewhere classified: Secondary | ICD-10-CM | POA: Diagnosis present

## 2022-07-01 DIAGNOSIS — R42 Dizziness and giddiness: Secondary | ICD-10-CM | POA: Diagnosis not present

## 2022-07-01 DIAGNOSIS — I4891 Unspecified atrial fibrillation: Secondary | ICD-10-CM | POA: Diagnosis present

## 2022-07-01 DIAGNOSIS — M79661 Pain in right lower leg: Secondary | ICD-10-CM | POA: Diagnosis present

## 2022-07-01 DIAGNOSIS — E1122 Type 2 diabetes mellitus with diabetic chronic kidney disease: Secondary | ICD-10-CM | POA: Diagnosis present

## 2022-07-01 DIAGNOSIS — I2489 Other forms of acute ischemic heart disease: Secondary | ICD-10-CM | POA: Diagnosis present

## 2022-07-01 DIAGNOSIS — M6281 Muscle weakness (generalized): Secondary | ICD-10-CM | POA: Diagnosis present

## 2022-07-01 DIAGNOSIS — I10 Essential (primary) hypertension: Secondary | ICD-10-CM | POA: Diagnosis present

## 2022-07-01 DIAGNOSIS — F419 Anxiety disorder, unspecified: Secondary | ICD-10-CM | POA: Diagnosis present

## 2022-07-01 DIAGNOSIS — R410 Disorientation, unspecified: Secondary | ICD-10-CM | POA: Diagnosis not present

## 2022-07-01 DIAGNOSIS — Z951 Presence of aortocoronary bypass graft: Secondary | ICD-10-CM | POA: Diagnosis not present

## 2022-07-01 DIAGNOSIS — E876 Hypokalemia: Secondary | ICD-10-CM | POA: Diagnosis present

## 2022-07-01 DIAGNOSIS — L03116 Cellulitis of left lower limb: Secondary | ICD-10-CM | POA: Diagnosis present

## 2022-07-01 DIAGNOSIS — K219 Gastro-esophageal reflux disease without esophagitis: Secondary | ICD-10-CM

## 2022-07-01 DIAGNOSIS — E11649 Type 2 diabetes mellitus with hypoglycemia without coma: Secondary | ICD-10-CM | POA: Diagnosis not present

## 2022-07-01 DIAGNOSIS — I130 Hypertensive heart and chronic kidney disease with heart failure and stage 1 through stage 4 chronic kidney disease, or unspecified chronic kidney disease: Secondary | ICD-10-CM | POA: Diagnosis present

## 2022-07-01 DIAGNOSIS — E119 Type 2 diabetes mellitus without complications: Secondary | ICD-10-CM | POA: Diagnosis present

## 2022-07-01 DIAGNOSIS — Z7401 Bed confinement status: Secondary | ICD-10-CM | POA: Diagnosis not present

## 2022-07-01 LAB — CBC WITH DIFFERENTIAL/PLATELET
Abs Immature Granulocytes: 0.08 10*3/uL — ABNORMAL HIGH (ref 0.00–0.07)
Basophils Absolute: 0 10*3/uL (ref 0.0–0.1)
Basophils Relative: 0 %
Eosinophils Absolute: 0.1 10*3/uL (ref 0.0–0.5)
Eosinophils Relative: 1 %
HCT: 37.8 % (ref 36.0–46.0)
Hemoglobin: 12.1 g/dL (ref 12.0–15.0)
Immature Granulocytes: 1 %
Lymphocytes Relative: 8 %
Lymphs Abs: 1.1 10*3/uL (ref 0.7–4.0)
MCH: 30.8 pg (ref 26.0–34.0)
MCHC: 32 g/dL (ref 30.0–36.0)
MCV: 96.2 fL (ref 80.0–100.0)
Monocytes Absolute: 0.8 10*3/uL (ref 0.1–1.0)
Monocytes Relative: 6 %
Neutro Abs: 11.4 10*3/uL — ABNORMAL HIGH (ref 1.7–7.7)
Neutrophils Relative %: 84 %
Platelets: 197 10*3/uL (ref 150–400)
RBC: 3.93 MIL/uL (ref 3.87–5.11)
RDW: 17.3 % — ABNORMAL HIGH (ref 11.5–15.5)
WBC: 13.5 10*3/uL — ABNORMAL HIGH (ref 4.0–10.5)
nRBC: 0 % (ref 0.0–0.2)

## 2022-07-01 LAB — COMPREHENSIVE METABOLIC PANEL
ALT: 30 U/L (ref 0–44)
AST: 53 U/L — ABNORMAL HIGH (ref 15–41)
Albumin: 1.9 g/dL — ABNORMAL LOW (ref 3.5–5.0)
Alkaline Phosphatase: 116 U/L (ref 38–126)
Anion gap: 11 (ref 5–15)
BUN: 13 mg/dL (ref 8–23)
CO2: 20 mmol/L — ABNORMAL LOW (ref 22–32)
Calcium: 8.4 mg/dL — ABNORMAL LOW (ref 8.9–10.3)
Chloride: 105 mmol/L (ref 98–111)
Creatinine, Ser: 0.8 mg/dL (ref 0.44–1.00)
GFR, Estimated: >60 mL/min (ref >60)
Glucose, Bld: 152 mg/dL — ABNORMAL HIGH (ref 70–99)
Potassium: 4 mmol/L (ref 3.5–5.1)
Sodium: 136 mmol/L (ref 135–145)
Total Bilirubin: 2.6 mg/dL — ABNORMAL HIGH (ref 0.3–1.2)
Total Protein: 6.6 g/dL (ref 6.5–8.1)

## 2022-07-01 LAB — PROTIME-INR
INR: 1.2 (ref 0.8–1.2)
Prothrombin Time: 15.3 seconds — ABNORMAL HIGH (ref 11.4–15.2)

## 2022-07-01 LAB — GLUCOSE, CAPILLARY
Glucose-Capillary: 309 mg/dL — ABNORMAL HIGH (ref 70–99)
Glucose-Capillary: 226 mg/dL — ABNORMAL HIGH (ref 70–99)

## 2022-07-01 LAB — CBG MONITORING, ED
Glucose-Capillary: 190 mg/dL — ABNORMAL HIGH (ref 70–99)
Glucose-Capillary: 203 mg/dL — ABNORMAL HIGH (ref 70–99)
Glucose-Capillary: 233 mg/dL — ABNORMAL HIGH (ref 70–99)

## 2022-07-01 LAB — MAGNESIUM: Magnesium: 1.9 mg/dL (ref 1.7–2.4)

## 2022-07-01 MED ORDER — RIFAXIMIN 550 MG PO TABS
550.0000 mg | ORAL_TABLET | Freq: Two times a day (BID) | ORAL | Status: DC
  Administered 2022-07-01 – 2022-07-04 (×7): 550 mg via ORAL
  Filled 2022-07-01 (×7): qty 1

## 2022-07-01 MED ORDER — ENOXAPARIN SODIUM 40 MG/0.4ML IJ SOSY
40.0000 mg | PREFILLED_SYRINGE | INTRAMUSCULAR | Status: DC
  Administered 2022-07-01 – 2022-07-03 (×3): 40 mg via SUBCUTANEOUS
  Filled 2022-07-01 (×3): qty 0.4

## 2022-07-01 MED ORDER — FUROSEMIDE 40 MG PO TABS
40.0000 mg | ORAL_TABLET | Freq: Every day | ORAL | Status: DC
  Administered 2022-07-02 – 2022-07-04 (×3): 40 mg via ORAL
  Filled 2022-07-01 (×3): qty 1

## 2022-07-01 MED ORDER — ALBUMIN HUMAN 25 % IV SOLN
12.5000 g | Freq: Once | INTRAVENOUS | Status: AC
  Administered 2022-07-01: 12.5 g via INTRAVENOUS
  Filled 2022-07-01: qty 50

## 2022-07-01 MED ORDER — LORAZEPAM 0.5 MG PO TABS: 0.5000 mg | ORAL_TABLET | Freq: Every evening | ORAL | Status: DC | PRN

## 2022-07-01 MED ORDER — ONDANSETRON HCL 4 MG/2ML IJ SOLN
4.0000 mg | Freq: Four times a day (QID) | INTRAMUSCULAR | Status: DC | PRN
  Filled 2022-07-01: qty 2

## 2022-07-01 MED ORDER — FUROSEMIDE 20 MG PO TABS: 20.0000 mg | ORAL_TABLET | Freq: Every day | ORAL | Status: DC

## 2022-07-01 MED ORDER — PANTOPRAZOLE SODIUM 40 MG PO TBEC
40.0000 mg | DELAYED_RELEASE_TABLET | Freq: Every day | ORAL | Status: DC
  Administered 2022-07-01 – 2022-07-04 (×4): 40 mg via ORAL
  Filled 2022-07-01 (×4): qty 1

## 2022-07-01 MED ORDER — ACETAMINOPHEN 650 MG RE SUPP
650.0000 mg | Freq: Four times a day (QID) | RECTAL | Status: DC | PRN
  Filled 2022-07-01: qty 1

## 2022-07-01 MED ORDER — FUROSEMIDE 10 MG/ML IJ SOLN
40.0000 mg | Freq: Once | INTRAMUSCULAR | Status: AC
  Administered 2022-07-01: 40 mg via INTRAVENOUS
  Filled 2022-07-01: qty 4

## 2022-07-01 MED ORDER — INSULIN ASPART 100 UNIT/ML IJ SOLN
0.0000 [IU] | Freq: Every day | INTRAMUSCULAR | Status: DC
  Administered 2022-07-01: 2 [IU] via SUBCUTANEOUS

## 2022-07-01 MED ORDER — EMPAGLIFLOZIN 25 MG PO TABS
25.0000 mg | ORAL_TABLET | Freq: Every day | ORAL | Status: DC
  Administered 2022-07-02 – 2022-07-04 (×3): 25 mg via ORAL
  Filled 2022-07-01 (×5): qty 1

## 2022-07-01 MED ORDER — MELATONIN 3 MG PO TABS: 3.0000 mg | ORAL_TABLET | Freq: Every evening | ORAL | Status: DC | PRN

## 2022-07-01 MED ORDER — INSULIN ASPART 100 UNIT/ML IJ SOLN
0.0000 [IU] | Freq: Three times a day (TID) | INTRAMUSCULAR | Status: DC
  Administered 2022-07-01 (×2): 2 [IU] via SUBCUTANEOUS

## 2022-07-01 MED ORDER — MECLIZINE HCL 25 MG PO TABS: 25.0000 mg | ORAL_TABLET | Freq: Three times a day (TID) | ORAL | Status: DC | PRN

## 2022-07-01 MED ORDER — ASPIRIN 81 MG PO TBEC
81.0000 mg | DELAYED_RELEASE_TABLET | Freq: Every day | ORAL | Status: DC
  Administered 2022-07-01 – 2022-07-04 (×4): 81 mg via ORAL
  Filled 2022-07-01 (×4): qty 1

## 2022-07-01 MED ORDER — INSULIN DETEMIR 100 UNIT/ML ~~LOC~~ SOLN
8.0000 [IU] | Freq: Every day | SUBCUTANEOUS | Status: DC
  Administered 2022-07-01: 8 [IU] via SUBCUTANEOUS
  Filled 2022-07-01 (×3): qty 0.08

## 2022-07-01 MED ORDER — LACTULOSE 10 GM/15ML PO SOLN
30.0000 g | Freq: Every day | ORAL | Status: DC
  Administered 2022-07-01 – 2022-07-04 (×4): 30 g via ORAL
  Filled 2022-07-01 (×3): qty 60
  Filled 2022-07-01: qty 45

## 2022-07-01 MED ORDER — INSULIN DETEMIR 100 UNIT/ML ~~LOC~~ SOLN
15.0000 [IU] | Freq: Every day | SUBCUTANEOUS | Status: DC
  Administered 2022-07-01: 15 [IU] via SUBCUTANEOUS
  Filled 2022-07-01 (×2): qty 0.15

## 2022-07-01 MED ORDER — TRAZODONE HCL 50 MG PO TABS
25.0000 mg | ORAL_TABLET | Freq: Every day | ORAL | Status: DC
  Administered 2022-07-01 – 2022-07-03 (×3): 25 mg via ORAL
  Filled 2022-07-01 (×3): qty 1

## 2022-07-01 MED ORDER — EZETIMIBE 10 MG PO TABS
10.0000 mg | ORAL_TABLET | Freq: Every day | ORAL | Status: DC
  Administered 2022-07-01 – 2022-07-04 (×4): 10 mg via ORAL
  Filled 2022-07-01 (×4): qty 1

## 2022-07-01 MED ORDER — ACETAMINOPHEN 325 MG PO TABS
650.0000 mg | ORAL_TABLET | Freq: Four times a day (QID) | ORAL | Status: DC | PRN
  Administered 2022-07-01 – 2022-07-04 (×10): 650 mg via ORAL
  Filled 2022-07-01 (×10): qty 2

## 2022-07-01 MED ORDER — FUROSEMIDE 10 MG/ML IJ SOLN: 40.0000 mg | Freq: Once | INTRAMUSCULAR | Status: DC

## 2022-07-01 MED ORDER — INSULIN ASPART 100 UNIT/ML IJ SOLN
0.0000 [IU] | Freq: Three times a day (TID) | INTRAMUSCULAR | Status: DC
  Administered 2022-07-01: 11 [IU] via SUBCUTANEOUS
  Administered 2022-07-02: 8 [IU] via SUBCUTANEOUS
  Administered 2022-07-02 (×2): 3 [IU] via SUBCUTANEOUS
  Administered 2022-07-03: 5 [IU] via SUBCUTANEOUS
  Administered 2022-07-03: 3 [IU] via SUBCUTANEOUS
  Administered 2022-07-04: 2 [IU] via SUBCUTANEOUS
  Administered 2022-07-04: 5 [IU] via SUBCUTANEOUS

## 2022-07-01 NOTE — Progress Notes (Signed)
PROGRESS NOTE  [name] [id number] DOB: 1945-09-09   PCP: [name], MD  Patient is from: SNF  DOA: 06/30/2022 LOS: 0  Chief complaints Chief Complaint  Patient presents with   Dizziness     Brief Narrative / Interim history: 77 year old F with PMH of NASH cirrhosis s/p TIPs, diastolic CHF, chronic edema, DM-2, HLD, anxiety and lumbar fusion presenting with sudden onset dizziness (vertigo) and nausea, and admitted for vertigo earlier this morning.  Workup in ED including MRI brain unrevealing.  BNP elevated to 630 (slightly higher than baseline).  UA without significant finding.  Patient was admitted for vestibular PT evaluation.  Started on IV Lasix for BLE edema. On further interview, patient states she was started on low-dose oxycodone recently.    Subjective: Seen and examined earlier this morning.  No major events overnight of this morning.  She says dizziness has resolved.  Denies headache, vision change or focal neurodeficit.  She reports left hip and left calf pain since she had a fall.  She also reports bilateral lower extremity edema.  This has improved since she received IV Lasix in ED.  She denies chest pain, dyspnea or orthopnea.  Objective: Vitals:   07/01/22 0652 07/01/22 0730 07/01/22 1030 07/01/22 1153  BP:  132/62 129/69 132/62  Pulse:  78 80 81  Resp:  '16 13 18  '$ Temp: 98.8 F (37.1 C)   99 F (37.2 C)  TempSrc: Oral   Oral  SpO2:  98% 98% 99%  Weight:      Height:        Examination:  GENERAL: No apparent distress.  Nontoxic. HEENT: MMM.  Vision and hearing grossly intact.  NECK: Supple.  No apparent JVD.  RESP:  No IWOB.  Fair aeration bilaterally. CVS:  RRR. Heart sounds normal.  ABD/GI/GU: BS+. Abd soft, NTND.  MSK/EXT:  Moves extremities. No apparent deformity.  3+ BLE edema.  Left calf tenderness. SKIN: no apparent skin lesion or wound NEURO: Awake, alert and oriented appropriately.  Horizontal nystagmus.  No facial  asymmetry.  Speech clear.  Finger-to-nose intact.  Subtle LLE weakness, 3+/5.  Left calf tenderness. PSYCH: Calm. Normal affect.   Procedures:  None  Microbiology summarized: None  Assessment and plan: Principal Problem:   Vertigo Active Problems:   Hyperlipidemia   GERD (gastroesophageal reflux disease)   DM2 (diabetes mellitus, type 2) (HCC)   Liver cirrhosis secondary to NASH (HCC)   General weakness   Acute on chronic diastolic CHF (congestive heart failure) (HCC)   GAD (generalized anxiety disorder)   Bilateral lower extremity edema   Tenderness of left calf   Left hip pain   Hypoalbuminemia  Vestibular vertigo: She has horizontal nystagmus on exam.  Finger-to-nose intact.  MRI without acute finding.  She states she was recently started on low-dose oxycodone.  No recent URI symptoms.  No prior history of vertigo.  Vertigo has resolved.   -PT recommended home health PT/vestibular rehab -Fall precaution -Would avoid oxycodone  Acute on chronic diastolic CHF: TTE in [id number] with LVEF of 60 to 65%, G2-DD and normal RVSP.  She has significant BLE edema but no cardiopulmonary symptoms.  BNP 630 (higher than baseline).  He also have hypoalbuminemia.  Still with 3+ BLE edema.  Started on IV Lasix while in ED. I&O incomplete but she reports good urine output. -IV Lasix with IV albumin today -Continue home Jardiance -Strict intake and output, daily weight, electrolytes and renal function -Consider increasing home Lasix  and adding Aldactone on discharge given history of cirrhosis.  NASH Cirrhosis s/p TIPS: No signs of SBP.  No encephalopathy. -Diuretics as above. -Continue home rifaximin and lactulose.  Right hip pain/right calf pain/BLE edema: Reports recent fall.  X-ray without acute finding.  She has BLE edema with left calf tenderness. -Lower extremity venous Doppler to exclude DVT  Uncontrolled IDDM-2 with hyperglycemia: A1c 9.2% in 03/2022.  On Tresiba 25 units at night, SSI,  Jardiance Recent Labs  Lab 07/01/22 0245 07/01/22 0754 07/01/22 1151  GLUCAP 190* 203* 233*  -Increase Semglee from 8 to 15 units daily. -Increase SSI to moderate.  Add nightly coverage -Recheck hemoglobin A1c -Continue statin and Zetia  History of CAD/CABG x 3 -Continue home meds.  Elevated troponin: Mild.  Likely demand ischemia. -Diuretics as above  Hypoalbuminemia: -IV albumin as above  Anxiety/insomnia: -Continue home meds.  Physical deconditioning/generalized weakness -PT/OT   Body mass index is 24.99 kg/m.          DVT prophylaxis:  enoxaparin (LOVENOX) injection 40 mg Start: 07/01/22 1500 SCDs Start: 07/01/22 0048  Code Status: Full code Family Communication: Updated patient's son at bedside. Level of care: Telemetry Medical Status is: Observation The patient will require care spanning > 2 midnights and should be moved to inpatient because: Acute on chronic diastolic CHF and bilateral lower extremity edema   Final disposition: TBD Consultants:  None  55 minutes with more than 50% spent in reviewing records, counseling patient/family and coordinating care.   Sch Meds:  Scheduled Meds:  aspirin EC  81 mg Oral Daily   empagliflozin  25 mg Oral Daily   enoxaparin (LOVENOX) injection  40 mg Subcutaneous Q24H   ezetimibe  10 mg Oral Daily   [START ON 07/02/2022] furosemide  40 mg Oral Daily   insulin aspart  0-15 Units Subcutaneous TID WC   insulin aspart  0-5 Units Subcutaneous QHS   insulin detemir  15 Units Subcutaneous QHS   lactulose  30 g Oral Daily   pantoprazole  40 mg Oral Daily   rifaximin  550 mg Oral BID   traZODone  25 mg Oral QHS   Continuous Infusions: PRN Meds:.acetaminophen **OR** acetaminophen, LORazepam, meclizine, melatonin, ondansetron (ZOFRAN) IV  Antimicrobials: Anti-infectives (From admission, onward)    Start     Dose/Rate Route Frequency Ordered Stop   07/01/22 1000  rifaximin (XIFAXAN) tablet 550 mg        550 mg  Oral 2 times daily 07/01/22 0126          I have personally reviewed the following labs and images: CBC: Recent Labs  Lab 06/30/22 1715 07/01/22 0143  WBC 11.9* 13.5*  NEUTROABS 10.5* 11.4*  HGB 11.6* 12.1  HCT 34.6* 37.8  MCV 93.5 96.2  PLT 166 197   BMP &GFR Recent Labs  Lab 06/30/22 1715 07/01/22 0143  NA 136 136  K 3.6 4.0  CL 105 105  CO2 21* 20*  GLUCOSE 178* 152*  BUN 10 13  CREATININE 0.74 0.80  CALCIUM 8.4* 8.4*  MG  --  1.9   Estimated Creatinine Clearance: 54.7 mL/min (by C-G formula based on SCr of 0.8 mg/dL). Liver & Pancreas: Recent Labs  Lab 06/30/22 1715 07/01/22 0143  AST 61* 53*  ALT 33 30  ALKPHOS 125 116  BILITOT 2.2* 2.6*  PROT 6.7 6.6  ALBUMIN 2.0* 1.9*   No results for input(s): "LIPASE", "AMYLASE" in the last 168 hours. Recent Labs  Lab 06/30/22 1715  AMMONIA  24   Diabetic: No results for input(s): "HGBA1C" in the last 72 hours. Recent Labs  Lab 07/01/22 0245 07/01/22 0754 07/01/22 1151  GLUCAP 190* 203* 233*   Cardiac Enzymes: No results for input(s): "CKTOTAL", "CKMB", "CKMBINDEX", "TROPONINI" in the last 168 hours. No results for input(s): "PROBNP" in the last 8760 hours. Coagulation Profile: Recent Labs  Lab 07/01/22 0143  INR 1.2   Thyroid Function Tests: No results for input(s): "TSH", "T4TOTAL", "FREET4", "T3FREE", "THYROIDAB" in the last 72 hours. Lipid Profile: No results for input(s): "CHOL", "HDL", "LDLCALC", "TRIG", "CHOLHDL", "LDLDIRECT" in the last 72 hours. Anemia Panel: No results for input(s): "VITAMINB12", "FOLATE", "FERRITIN", "TIBC", "IRON", "RETICCTPCT" in the last 72 hours. Urine analysis:    Component Value Date/Time   COLORURINE YELLOW 06/30/2022 2122   APPEARANCEUR CLEAR 06/30/2022 2122   LABSPEC 1.039 (H) 06/30/2022 2122   PHURINE 5.0 06/30/2022 2122   GLUCOSEU >=500 (A) 06/30/2022 2122   HGBUR MODERATE (A) 06/30/2022 2122   BILIRUBINUR NEGATIVE 06/30/2022 2122   BILIRUBINUR  Negative 03/24/2022 1031   KETONESUR 5 (A) 06/30/2022 2122   PROTEINUR NEGATIVE 06/30/2022 2122   UROBILINOGEN 0.2 03/24/2022 1031   UROBILINOGEN 0.2 12/18/2011 1409   NITRITE NEGATIVE 06/30/2022 2122   LEUKOCYTESUR NEGATIVE 06/30/2022 2122   Sepsis Labs: Invalid input(s): "PROCALCITONIN", "LACTICIDVEN"  Microbiology: No results found for this or any previous visit (from the past 240 hour(s)).  Radiology Studies: DG HIP UNILAT WITH PELVIS 2-3 VIEWS LEFT  Result Date: 07/01/2022 CLINICAL DATA:  Left hip pain, recent fall in January EXAM: DG HIP (WITH OR WITHOUT PELVIS) 2-3V LEFT COMPARISON:  05/27/2022 FINDINGS: Bones are osteopenic. Fusion hardware noted of the visualized lower lumbar spine. Bony pelvis and hips appear symmetric and intact. No displaced hip fracture or malalignment. Nonobstructive bowel gas pattern. IMPRESSION: Osteopenia. No acute finding by plain radiography. Electronically Signed   By: Jerilynn Mages.  Shick M.D.   On: 07/01/2022 14:44   MR BRAIN WO CONTRAST  Result Date: 06/30/2022 CLINICAL DATA:  Horizontal nystagmus.  Dizziness and nausea. EXAM: MRI HEAD WITHOUT CONTRAST TECHNIQUE: Multiplanar, multiecho pulse sequences of the brain and surrounding structures were obtained without intravenous contrast. COMPARISON:  CT Head 05/28/22 FINDINGS: Brain: No acute infarction, hemorrhage, hydrocephalus, extra-axial collection or mass lesion. Sequela of moderate chronic microvascular ischemic change. There is T2/FLAIR hyperintense signal in the subcortical white matter in the bilateral parietal lobes, which is also favored to represent sequela of chronic ischemic change. Vascular: Right A1 flow void is not visualized, possibly congenital Skull and upper cervical spine: Normal marrow signal. Sinuses/Orbits: Trace right mastoid effusion. Mild mucosal thickening bilateral ethmoid air cells. Other: None. IMPRESSION: No acute intracranial process. Electronically Signed   By: Marin Roberts M.D.   On:  06/30/2022 20:03      Rosario Kushner T. Cricket  If 7PM-7AM, please contact night-coverage [email or web address] 07/01/2022, 3:02 PM

## 2022-07-01 NOTE — Progress Notes (Signed)
BLE venous duplex has been completed.     Results can be found under chart review under CV PROC. 07/01/2022 4:11 PM Tru Rana RVT, RDMS

## 2022-07-01 NOTE — H&P (Signed)
History and Physical      [name] [id number] DOB: [birth year] DOA: 06/30/2022  PCP: [name], MD  Patient coming from: home   I have personally briefly reviewed patient's old medical records in [place]  Chief Complaint: Vertigo  HPI: [name] is a 77 y.o. female with medical history significant for [name] cirrhosis, chronic diastolic heart failure, chronic edema of the right lower extremity, hyperlipidemia, type 2 diabetes mellitus, GAD, who is admitted to [facility] on 06/30/2022 with vertigo after presenting from home to [facility] ED complaining of vertigo.   The patient reports 1 day recurrent vertigo, which she describes as the sensation of the room spinning.  She notes that this sensation is exacerbated with certain movements of the head, including rotational movements, and notes improvement when laying flat.  She notes that this vertigo is complicated her ambulatory abilities, and she consequently conveys concern regarding increased fall risk as result of this new onset vertigo.  She denies any associated acute focal weakness, numbness, paresthesias, dysphagia, acute change in vision, slurry speech, facial droop, or headache.  Not associate with any chest pain, shortness of breath, palpitations, diaphoresis, presyncope, or syncope.  No recent trauma, and she is not on any blood thinners, including no aspirin.  Denies any recent subjective fever, chills, rigors, or generalized myalgias.  No recent rhinitis, rhinorrhea, sore throat, cough.  She reports that her vertigo is not associate with any acute hearing loss nor any new tinnitus.      ED Course:  Vital signs in the ED were notable for the following: Afebrile; heart rate 0000000; systolic pressures in the 120s 140s; respiratory rate 16-22, oxygen saturation 95 to 100% on room air.  Labs were notable for the following: CMP notable for the following: Sodium 136, creatinine 0.74 compared  to most recent prior serum creatinine data point of 0.5 on 05/27/2022, glucose 170, Fossier's 125, AST 61 compared to 47 on 119 2 4, ALT 33 compared to 12 on 05/27/2022, and total bilirubin 2.2 compared to 2.8 on 119 2 4.  High-sensitivity troponin I initially 20, 35 trending up slightly to 25, relative demonstrated prior high-sensitivity creatinine value 37 on 05/28/2022.  CBC notable for white blood cell count 11,900 compared to 11,200 on 06/01/2022 urinalysis notable for no white blood cells, leukocyte esterase/nitrate negative, and no evidence of RBCs.  Ammonia 24 compared to most recent value 41 on 05/26/2019.  Per my interpretation, EKG in ED demonstrated the following: Sinus rhythm with heart rate 91, nonspecific to inversions in aVL, no evidence of ST changes, including no evidence of ST elevation.  MRI brain shows evidence of acute intracranial process, including no evidence of acute infarct.  While in the ED, the following were administered: Meclizine 25 mg p.o. x 1, Lasix 40 mg IV x 1.  Subsequently, the patient was admitted overnight observation for further evaluation management refractory vertigo.     Review of Systems: As per HPI otherwise 10 point review of systems negative.   Past Medical History:  Diagnosis Date   Chronic cystitis    Chronic diastolic CHF (congestive heart failure) ([place])    followed by dr Johnsie Cancel   Chronic kidney disease, stage I    Cirrhosis of liver without ascites ([place]) 03/2016   followed by dr [name] (GI);   due to fatty liver;   post op cabg 09/ 2017  , post paracentesis for ascites   Coronary artery disease 01/2016   cardiologist--- dr  nishen;   01-27-2016  NSTEMI  s/p cardiac cath;   s/p CABG 01/29/16: LIMA-LAD, SVG-RI, SVG-dRCA   DDD (degenerative disc disease), cervical    per pt w/ chronic neck pain   Dyspnea    occasional   Edema of right lower extremity    Esophageal varices (HCC)    followed by dr [name];   hx multiple banding of large varices    Essential hypertension    no meds   GERD (gastroesophageal reflux disease)    Heart murmur     DX FOR YEARS ASYMPTOMATIC   Hepatic encephalopathy ([place])    History of adenomatous polyp of colon    History of COVID-19 04/2020   per pt mild  symptoms that resolve   History of kidney stones    passed stones   History of non-ST elevation myocardial infarction (NSTEMI) 01/27/2016   Hyperlipidemia    diet controlled   NAFLD (nonalcoholic fatty liver disease)    Neuropathy, peripheral 04/01/2015   OA (osteoarthritis)    Paroxysmal A-fib ([place])    08/19/19 afib with RVR, spontaneously converted to SR   Portal vein thrombosis    per dr [name]  ,  chronic non-occluded   Thrombocytopenia ([place])    followed by pcp  and hematology-- dr Burr Medico;   likely secondary to liver cirrhosis   Type 2 diabetes mellitus treated with insulin ([place])    Type 2, followed by pcp    (07-21-2021  per pt checks blood sugar dialy in am fasting average 113--200s)   Urge incontinence of urine    Wears hearing aid in both ears     Past Surgical History:  Procedure Laterality Date   CARDIAC CATHETERIZATION N/A 01/27/2016   Procedure: Left Heart Cath and Coronary Angiography;  Surgeon: Belva Crome, MD;  Location: [place] [facility];  Service: Cardiovascular;  Laterality: N/A;   COLONOSCOPY WITH PROPOFOL N/A 07/07/2016   Procedure: COLONOSCOPY WITH PROPOFOL;  Surgeon: [name], MD;  Location: [facility];  Service: Endoscopy;  Laterality: N/A;   CORONARY ARTERY BYPASS GRAFT N/A 01/28/2016   Procedure: CORONARY ARTERY BYPASS GRAFTING (CABG) x 3 USING RIGHT LEG GREATER SAPHENOUS VEIN GRAFT;  Surgeon: Melrose Nakayama, MD;  Location: [place];  Service: Open Heart Surgery;  Laterality: N/A;   CYSTOSCOPY WITH RETROGRADE PYELOGRAM, URETEROSCOPY AND STENT PLACEMENT Left 05/30/2021   Procedure: CYSTOSCOPY WITH RETROGRADE PYELOGRAM, URETEROSCOPY AND STENT PLACEMENT;  Surgeon: Ceasar Mons, MD;  Location: [place];  Service: Urology;  Laterality: Left;   CYSTOSCOPY/URETEROSCOPY/HOLMIUM LASER/STENT PLACEMENT Left 07/26/2021   Procedure: CYSTOSCOPY/RETROGRADE/URETEROSCOPY/HOLMIUM LASER/STENT EXCHANGE;  Surgeon: Janith Lima, MD;  Location: [facility];  Service: Urology;  Laterality: Left;   ENDOVEIN HARVEST OF GREATER SAPHENOUS VEIN Right 01/28/2016   Procedure: ENDOVEIN HARVEST OF GREATER SAPHENOUS VEIN;  Surgeon: Melrose Nakayama, MD;  Location: [place];  Service: Open Heart Surgery;  Laterality: Right;   ESOPHAGEAL BANDING  03/28/2019   Procedure: ESOPHAGEAL BANDING;  Surgeon: [name], MD;  Location: [facility];  Service: Endoscopy;;   ESOPHAGEAL BANDING  04/07/2019   Procedure: ESOPHAGEAL BANDING;  Surgeon: Juanita Craver, MD;  Location: [facility] ENDOSCOPY;  Service: Endoscopy;;   ESOPHAGOGASTRODUODENOSCOPY N/A 04/07/2019   Procedure: ESOPHAGOGASTRODUODENOSCOPY (EGD);  Surgeon: Juanita Craver, MD;  [facility];  Service: Endoscopy;  Laterality: N/A;   ESOPHAGOGASTRODUODENOSCOPY (EGD) WITH PROPOFOL N/A 07/07/2016   Procedure: ESOPHAGOGASTRODUODENOSCOPY (EGD) WITH PROPOFOL;  Surgeon: [name], MD;  [facility];  Service: Endoscopy;  Laterality: N/A;   ESOPHAGOGASTRODUODENOSCOPY (EGD) WITH PROPOFOL N/A 03/28/2019   Procedure: ESOPHAGOGASTRODUODENOSCOPY (EGD) WITH PROPOFOL;  Surgeon: [name], MD;  Location: [facility];  Service: Endoscopy;  Laterality: N/A;   EXTRACORPOREAL SHOCK WAVE LITHOTRIPSY  2010   HEMOSTASIS CLIP PLACEMENT  04/07/2019   Procedure: HEMOSTASIS CLIP PLACEMENT;  Surgeon: Juanita Craver, MD;  Location: [place];  Service: Endoscopy;;   IR ANGIOGRAM SELECTIVE EACH ADDITIONAL VESSEL  04/08/2019   IR EMBO ART  VEN HEMORR LYMPH EXTRAV  INC GUIDE ROADMAPPING  04/08/2019   IR PARACENTESIS  04/08/2019   IR RADIOLOGIST EVAL & MGMT  06/13/2019   IR RADIOLOGIST EVAL & MGMT  09/25/2019   IR RADIOLOGIST EVAL & MGMT  07/07/2020   IR  RADIOLOGIST EVAL & MGMT  07/30/2021   IR TIPS  04/08/2019   LAMINECTOMY WITH POSTERIOR LATERAL ARTHRODESIS LEVEL 1 Bilateral 02/25/2022   Procedure: Laminectomy and Foraminotomy - Lumbar four-Lumbar five - bilateral, posterolateral arthrodesis Lumbar four-five, extension of hardware Lumbar four-five;  Surgeon: Eustace Moore, MD;  Location: [place];  Service: Neurosurgery;  Laterality: Bilateral;   MAXIMUM ACCESS (MAS)POSTERIOR LUMBAR INTERBODY FUSION (PLIF) 1 LEVEL Left 06/10/2015   Procedure: FOR MAXIMUM ACCESS (MAS) POSTERIOR LUMBAR INTERBODY FUSION (PLIF) LUMBAR THREE-FOUR EXTRAFORAMINAL MICRODISCECTOMY LUMBAR FIVE-SACRAL ONE LEFT;  Surgeon: Eustace Moore, MD;  Location: [place] NEURO ORS;  Service: Neurosurgery;  Laterality: Left;   RADIOLOGY WITH ANESTHESIA N/A 04/08/2019   Procedure: RADIOLOGY WITH ANESTHESIA;  Surgeon: Radiologist, Medication, MD;  Location: [place];  Service: Radiology;  Laterality: N/A;   SCLEROTHERAPY  04/07/2019   Procedure: SCLEROTHERAPY;  Surgeon: Juanita Craver, MD;  Location: [facility] ENDOSCOPY;  Service: Endoscopy;;   TEE WITHOUT CARDIOVERSION N/A 01/28/2016   Procedure: TRANSESOPHAGEAL ECHOCARDIOGRAM (TEE);  Surgeon: Melrose Nakayama, MD;  Location: [place];  Service: Open Heart Surgery;  Laterality: N/A;   TUBAL LIGATION  1982   Dr Lynwood Dawley HERNIA REPAIR N/A 09/10/2019   Procedure: HERNIA REPAIR UMBILICAL ADULT;  Surgeon: Donnie Mesa, MD;  Location: [place];  Service: General;  Laterality: N/A;   VAGINAL HYSTERECTOMY  1997   Dr Rande Lawman    Social History:  reports that she has never smoked. She has never used smokeless tobacco. She reports that she does not drink alcohol and does not use drugs.   Allergies  Allergen Reactions   Kiwi Extract Anaphylaxis   Tdap [Tetanus-Diphth-Acell Pertussis] Swelling and Other (See Comments)    Swelling at injection site, gets very hot   Latex Itching, Dermatitis and Rash   Statins Other (See Comments)    RHABDOMYOLYSIS    Tramadol Nausea And Vomiting    Family History  Problem Relation Age of Onset   Heart disease Mother    Breast cancer Mother    Lung cancer Father    Arthritis Sister    Arthritis Brother    Liver cancer Brother    Lymphoma Brother    Heart disease Maternal Grandmother    Heart disease Maternal Grandfather    Heart disease Paternal Grandmother    Heart disease Paternal Grandfather    Other Daughter        house fire   Breast cancer Maternal Aunt    Breast cancer Paternal Aunt    Colon cancer Neg Hx    Esophageal cancer Neg Hx    Rectal cancer Neg Hx    Stomach cancer Neg Hx     Family history reviewed and not pertinent  Prior to Admission medications   Medication Sig Start Date End Date Taking? Authorizing Provider  Biotin 10000 MCG TABS Take 10,000 mcg by mouth daily.    [provider]  Calcium Carb-Cholecalciferol (CALCIUM 600 + D PO) Take 1 tablet by mouth in the morning and at bedtime.    [provider]  Cholecalciferol (VITAMIN D-3) 25 MCG (1000 UT) CAPS Take 1,000 Units by mouth daily.    [provider]  Cyanocobalamin (VITAMIN B 12 PO) Take 1,000 mcg by mouth daily.    [provider]  empagliflozin (JARDIANCE) 25 MG TABS tablet Take 1 tablet (25 mg total) by mouth daily. 03/14/22   [name], MD  ezetimibe (ZETIA) 10 MG tablet Take 1 tablet (10 mg total) by mouth daily. 02/09/22   [name], MD  fluticasone (FLONASE) 50 MCG/ACT nasal spray Place 1 spray into both nostrils daily as needed for allergies or rhinitis.    [provider]  furosemide (LASIX) 20 MG tablet TAKE 1 TABLET(20 MG) BY MOUTH DAILY 03/16/22   Josue Hector, MD  glucose blood test strip One Touch Ultra Blue test strips. Use to test blood sugar three times daily. Dx: E11.65 02/09/22   [name], MD  HYDROcodone-acetaminophen (NORCO) 5-325 MG tablet Take 1 tablet by mouth every 6 (six) hours as needed for moderate pain. 05/18/22    [name], MD  insulin aspart (NOVOLOG) 100 UNIT/ML injection Inject 4-6 Units into the skin 3 (three) times daily before meals. Take 4 units before breakfast and 6 uints before lunch/supper 06/02/22   Medina-Vargas, Monina C, NP  insulin degludec (TRESIBA FLEXTOUCH) 100 UNIT/ML FlexTouch Pen Inject 25 Units into the skin at bedtime. 05/18/22   [name], MD  Insulin Pen Needle (B-D ULTRAFINE III SHORT PEN) 31G X 8 MM MISC Use to give Insulin injections daily. Dx: E11.65 10/11/21   [name], MD  Insulin Syringe-Needle U-100 (INSULIN SYRINGE 1CC/31GX5/16") 31G X 5/16" 1 ML MISC USE AS DIRECTED TO INJECT LEVIMIR 07/29/20   [name], MD  lactulose [facility]) 10 GM/15ML solution TAKE 30 ML BY MOUTH EVERY DAY 11/26/21   [name], MD  LORazepam (ATIVAN) 0.5 MG tablet Take 1 tablet (0.5 mg total) by mouth at bedtime as needed for anxiety. 03/24/22   Medina-Vargas, Monina C, NP  MAGNESIUM PO Take 500 mg by mouth daily.     [provider]  metoprolol tartrate (LOPRESSOR) 25 MG tablet Take 1 tablet (25 mg total) by mouth as needed (use as needed for HR greater than 110). 08/21/19   Tommie Raymond, NP  Multiple Vitamins-Minerals (MULTIVITAMIN WITH MINERALS) tablet Take 1 tablet by mouth daily.    [provider]  pantoprazole (PROTONIX) 40 MG tablet TAKE 1 TABLET(40 MG) BY MOUTH DAILY 10/25/21   [name], MD  Polyethyl Glycol-Propyl Glycol (SYSTANE OP) Place 1 drop into both eyes at bedtime.    [provider]  Probiotic Product (PROBIOTIC DAILY PO) Take 1 capsule by mouth daily. Digestive Advantage Probiotic    [provider]  XIFAXAN 550 MG TABS tablet Take 1 tablet (550 mg total) by mouth 2 (two) times daily. 06/30/21   [name], MD     Objective    Physical Exam: Vitals:   06/30/22 2100 06/30/22 2130 06/30/22 2206 07/01/22 0030  BP: 132/66 128/62  131/61  Pulse: 88 85  85  Resp: (!) '22 17  17  '$ Temp:  97.7  F (36.5 C)   TempSrc:   Oral   SpO2: 99% 97%  97%  Weight:      Height:        General: appears to be stated age; alert, oriented Skin: warm, dry, no rash Head:  AT/[place] Mouth:  Oral mucosa membranes appear moist, normal dentition Neck: supple; trachea midline Heart:  RRR; did not appreciate any M/R/G Lungs: CTAB, did not appreciate any wheezes, rales, or rhonchi Abdomen: + BS; soft, ND, NT Vascular: 2+ pedal pulses b/l; 2+ radial pulses b/l Extremities: no peripheral edema, no muscle wasting Neuro: strength and sensation intact in upper and lower extremities b/l    Labs on Admission: I have personally reviewed following labs and imaging studies  CBC: Recent Labs  Lab 06/30/22 1715  WBC 11.9*  NEUTROABS 10.5*  HGB 11.6*  HCT 34.6*  MCV 93.5  PLT [id number]   Basic Metabolic Panel: Recent Labs  Lab 06/30/22 1715  NA 136  K 3.6  CL 105  CO2 21*  GLUCOSE 178*  BUN 10  CREATININE 0.74  CALCIUM 8.4*   GFR: Estimated Creatinine Clearance: 54.7 mL/min (by C-G formula based on SCr of 0.74 mg/dL). Liver Function Tests: Recent Labs  Lab 06/30/22 1715  AST 61*  ALT 33  ALKPHOS 125  BILITOT 2.2*  PROT 6.7  ALBUMIN 2.0*   No results for input(s): "LIPASE", "AMYLASE" in the last 168 hours. Recent Labs  Lab 06/30/22 1715  AMMONIA 24   Coagulation Profile: No results for input(s): "INR", "PROTIME" in the last 168 hours. Cardiac Enzymes: No results for input(s): "CKTOTAL", "CKMB", "CKMBINDEX", "TROPONINI" in the last 168 hours. BNP (last 3 results) No results for input(s): "PROBNP" in the last 8760 hours. HbA1C: No results for input(s): "HGBA1C" in the last 72 hours. CBG: No results for input(s): "GLUCAP" in the last 168 hours. Lipid Profile: No results for input(s): "CHOL", "HDL", "LDLCALC", "TRIG", "CHOLHDL", "LDLDIRECT" in the last 72 hours. Thyroid Function Tests: No results for input(s): "TSH", "T4TOTAL", "FREET4", "T3FREE", "THYROIDAB" in the last 72  hours. Anemia Panel: No results for input(s): "VITAMINB12", "FOLATE", "FERRITIN", "TIBC", "IRON", "RETICCTPCT" in the last 72 hours. Urine analysis:    Component Value Date/Time   COLORURINE YELLOW 06/30/2022 2122   APPEARANCEUR CLEAR 06/30/2022 2122   LABSPEC 1.039 (H) 06/30/2022 2122   PHURINE 5.0 06/30/2022 2122   GLUCOSEU >=500 (A) 06/30/2022 2122   HGBUR MODERATE (A) 06/30/2022 2122   BILIRUBINUR NEGATIVE 06/30/2022 2122   BILIRUBINUR Negative 03/24/2022 1031   KETONESUR 5 (A) 06/30/2022 2122   PROTEINUR NEGATIVE 06/30/2022 2122   UROBILINOGEN 0.2 03/24/2022 1031   UROBILINOGEN 0.2 12/18/2011 1409   NITRITE NEGATIVE 06/30/2022 2122   LEUKOCYTESUR NEGATIVE 06/30/2022 2122    Radiological Exams on Admission: MR BRAIN WO CONTRAST  Result Date: 06/30/2022 CLINICAL DATA:  Horizontal nystagmus.  Dizziness and nausea. EXAM: MRI HEAD WITHOUT CONTRAST TECHNIQUE: Multiplanar, multiecho pulse sequences of the brain and surrounding structures were obtained without intravenous contrast. COMPARISON:  CT Head 05/28/22 FINDINGS: Brain: No acute infarction, hemorrhage, hydrocephalus, extra-axial collection or mass lesion. Sequela of moderate chronic microvascular ischemic change. There is T2/FLAIR hyperintense signal in the subcortical white matter in the bilateral parietal lobes, which is also favored to represent sequela of chronic ischemic change. Vascular: Right A1 flow void is not visualized, possibly congenital Skull and upper cervical spine: Normal marrow signal. Sinuses/Orbits: Trace right mastoid effusion. Mild mucosal thickening bilateral ethmoid air cells. Other: None. IMPRESSION: No acute intracranial  process. Electronically Signed   By: Marin Roberts M.D.   On: 06/30/2022 20:03      Assessment/Plan   Principal Problem:   Vertigo Active Problems:   Hyperlipidemia   GERD (gastroesophageal reflux disease)   DM2 (diabetes mellitus, type 2) (HCC)   Liver cirrhosis secondary to NASH  (HCC)   Chronic diastolic CHF (congestive heart failure) (HCC)   GAD (generalized anxiety disorder)      #) Vertigo: 1 day of recurrent vertigo, it appears positional in nature and suggestive of benign paroxysmal positional vertigo that has been refractory to dose of Antivert in the emergency department today, resulting in associated concerns for increased fall risk.   Of note, MRI brain shows no evidence of acute infarct.  Vertebrobasilar insufficiency appears less likely.  Denies any recent symptoms suggestive of upper respiratory infection to increase index of suspicion for labyrinthitis.  Not associate with any recent/acute hearing loss.  Additionally, no overt pharmacologic contributions.  Overall, will attempt to symptomatically manage, while pursuing consultation of vestibular physical therapy for evaluation and therapeutic intervention.    Plan: Fall precautions ordered.  Consult placed for vestibular physical therapy.  Prn Antivert.  As needed Zofran.  Monitor strict I's and O's and daily weights.            #) Chronic diastolic heart failure: documented history of such, with most recent echocardiogram performed in December 2023, patient is doing will for LVEF 60 to 65%, no focal motion arise, grade 2 diastolic dysfunction, right ventricular systolic function within normal limits.  Mild to moderately dilated left atrium, as well as mild mitral regurgitation/mild mitral stenosis.. No clinical evidence to suggest acutely decompensated heart failure at this time. home diuretic regimen reportedly consists of the following: Lasix 20 mg p.o. daily.   Plan: monitor strict I's & O's and daily weights. Repeat CMP in AM. Check serum mag level. Continue home diuretic regimen.             #) [name]  cirrhosis: Documented history as such, In the absence of any history of alcohol abuse.  Complicated by portal hypertension with recurrent ascites, history of hepatic encephalopathy.   Notable outpatient medications include Lasix, with the notable absence of spironolactone.  Patient meds also notable for lactulose, with presenting ammonia level 24 compared to most recent prior value of 41 in January 2021.  Total bilirubin appears slightly lower than most recent prior value, as further quantified above.  Of note, unable to calculate MELD score at this time in the absence of current INR value.    Plan: Monitor strict I's and O's and daily weights.  Repeat CMP, CBC.  Check INR.  Check serum mag level.  Continue home Lasix.  Consideration for initiation of spironolactone to establish 5:2 ratio relative to Lasix in the context of concomitant history of portal hypertension.               #) Hyperlipidemia: documented h/o such. On Zetia as outpatient.   Plan: continue home Zetia.                #) Type 2 Diabetes Mellitus: documented history of such. Home insulin regimen: Tresiba 25 units subcu nightly, in addition to sliding scale NovoLog 3 times daily basis. Home oral hypoglycemic agents:" Fluids and. presenting blood sugar: 178. Most recent A1c noted to be 9.2% when checked in November 2023.  in terms of initial dose of basal insulin to be started during this hospitalization, will resume approximately  half of outpatient dose in order to reduce risk for ensuing hypoglycemia   Plan: accuchecks QAC and HS with low dose SSI.  Levemir 8 units SQ nightly, as above. hold home oral hypoglycemic agents during this hospitalization.             #) Generalized anxiety disorder: documented h/o such. On prn Ativan as outpatient.  Will plan to resume home Ativan, which may also provide some benefits in symptomatic management of her presenting vertigo.   Plan: Resume home prn Ativan, as above.       DVT prophylaxis: SCD's   Code Status: Full code Family Communication: none Disposition Plan: Per Rounding Team Consults called: none;  Admission status:  Observation     I SPENT GREATER THAN 75  MINUTES IN CLINICAL CARE TIME/MEDICAL DECISION-MAKING IN COMPLETING THIS ADMISSION.      [place] DO [facility]  From [place]   07/01/2022, 1:44 AM

## 2022-07-01 NOTE — Evaluation (Signed)
Physical Therapy Evaluation Patient Details Name: [name] MRN: [id number] DOB: [birth year] Today's Date: 07/01/2022  History of Present Illness  [name] is a 77 y.o. female admitted 2/22   after presenting from home to [name] ED complaining of vertigo. PMH: [name] cirrhosis, chronic diastolic heart failure, chronic edema of the right lower extremity, hyperlipidemia, type 2 diabetes mellitus, GAD  Clinical Impression  Pt admitted with above diagnosis. Difficult to treat pt as pt doesn't tolerate repositioning well due to chronic neck pain. Suspect crystals stuck between horizontal and posterior canal in left inner ear. Attempted repositioning today and unsure of success.  Will follow acutely.  Pt currently with functional limitations due to the deficits listed below (see PT Problem List). Pt will benefit from skilled PT to increase their independence and safety with mobility to allow discharge to the venue listed below.          Recommendations for follow up therapy are one component of a multi-disciplinary discharge planning process, led by the attending physician.  Recommendations may be updated based on patient status, additional functional criteria and insurance authorization.  Follow Up Recommendations Home health PT (vestibular rehab)      Assistance Recommended at Discharge Frequent or constant Supervision/Assistance  Patient can return home with the following  A lot of help with walking and/or transfers;A little help with bathing/dressing/bathroom;Assistance with cooking/housework;Assist for transportation;Help with stairs or ramp for entrance    Equipment Recommendations None recommended by PT  Recommendations for Other Services       Functional Status Assessment Patient has had a recent decline in their functional status and demonstrates the ability to make significant improvements in function in a reasonable and predictable amount of time.     Precautions  / Restrictions Precautions Precautions: Fall Restrictions Weight Bearing Restrictions: No      Mobility  Bed Mobility Overal bed mobility: Needs Assistance Bed Mobility: Rolling, Sidelying to Sit Rolling: Min assist Sidelying to sit: Min assist       General bed mobility comments: Needed assist to come to edge of stretcher and then lying back down.  Did test for vertigo with pt noted to have left rotary nystagmus with suspicious for crystals stuck between horizontal and left posterior canal. Pt couldnt tolerate Semont but did try to complete a left posterior canal canilith repositioning maneuver.  Difficult as pt has chronic neck pain per pt.    Transfers Overall transfer level: Needs assistance Equipment used: 2 person hand held assist Transfers: Sit to/from Stand Sit to Stand: Min assist           General transfer comment: Pt needed min assist and pt had all weight on her right LE with minimal weight on left LE c/o pain.  Pt sat back down due to painand weakness.    Ambulation/Gait                  Stairs            Wheelchair Mobility    Modified Rankin (Stroke Patients Only)       Balance Overall balance assessment: Needs assistance Sitting-balance support: No upper extremity supported, Feet supported Sitting balance-Leahy Scale: Fair     Standing balance support: Bilateral upper extremity supported, During functional activity Standing balance-Leahy Scale: Poor Standing balance comment: reliant on UE support  Pertinent Vitals/Pain Pain Assessment Pain Assessment: No/denies pain    Home Living                          Prior Function                       Hand Dominance        Extremity/Trunk Assessment   Upper Extremity Assessment Upper Extremity Assessment: Defer to OT evaluation    Lower Extremity Assessment Lower Extremity Assessment: LLE deficits/detail LLE: Unable  to fully assess due to pain    Cervical / Trunk Assessment Cervical / Trunk Assessment: Kyphotic  Communication      Cognition Arousal/Alertness: Awake/alert Behavior During Therapy: WFL for tasks assessed/performed Overall Cognitive Status: Within Functional Limits for tasks assessed                                          General Comments      Exercises     Assessment/Plan    PT Assessment Patient needs continued PT services  PT Problem List Decreased activity tolerance;Decreased balance;Decreased mobility;Decreased knowledge of use of DME;Decreased safety awareness;Decreased knowledge of precautions;Pain;Decreased strength       PT Treatment Interventions DME instruction;Gait training;Functional mobility training;Therapeutic activities;Therapeutic exercise;Balance training;Patient/family education;Stair training    PT Goals (Current goals can be found in the Care Plan section)  Acute Rehab PT Goals Patient Stated Goal: to go home and decr dizziness PT Goal Formulation: With patient Time For Goal Achievement: 07/15/22 Potential to Achieve Goals: Good    Frequency Min 3X/week     Co-evaluation               AM-PAC PT "6 Clicks" Mobility  Outcome Measure Help needed turning from your back to your side while in a flat bed without using bedrails?: A Little Help needed moving from lying on your back to sitting on the side of a flat bed without using bedrails?: A Little Help needed moving to and from a bed to a chair (including a wheelchair)?: A Little Help needed standing up from a chair using your arms (e.g., wheelchair or bedside chair)?: A Lot Help needed to walk in hospital room?: Total Help needed climbing 3-5 steps with a railing? : Total 6 Click Score: 13    End of Session Equipment Utilized During Treatment: Gait belt Activity Tolerance: Patient limited by fatigue Patient left: with call bell/phone within reach;with family/visitor  present (on stretcher) Nurse Communication: Mobility status PT Visit Diagnosis: Unsteadiness on feet (R26.81);Muscle weakness (generalized) (M62.81)    Time: [id number] PT Time Calculation (min) (ACUTE ONLY): 23 min   Charges:   PT Evaluation $PT Eval Moderate Complexity: 1 Mod PT Treatments $Canalith Rep Proc: 8-22 mins        [facility] M,PT Acute Rehab Services [phone]   Alvira Philips 07/01/2022, 2:39 PM

## 2022-07-01 NOTE — ED Notes (Signed)
RN and PA attempted to walk patient. Patient dangled her feet for about 5 minutes and stated she was still dizzy.

## 2022-07-01 NOTE — ED Notes (Signed)
RN provided snack for patient.

## 2022-07-01 NOTE — Inpatient Diabetes Management (Signed)
Inpatient Diabetes Program Recommendations  AACE/ADA: New Consensus Statement on Inpatient Glycemic Control (2015)  Target Ranges:  Prepandial:   less than 140 mg/dL      Peak postprandial:   less than 180 mg/dL (1-2 hours)      Critically ill patients:  140 - 180 mg/dL   Lab Results  Component Value Date   GLUCAP 233 (H) 07/01/2022   HGBA1C 9.2 (H) 03/24/2022    Review of Glycemic Control  Latest Reference Range & Units 07/01/22 02:45 07/01/22 07:54 07/01/22 11:51  Glucose-Capillary 70 - 99 mg/dL 190 (H) 203 (H) 233 (H)   Diabetes history: DM 2 Outpatient Diabetes medications: Tresiba 25 units qhs, Novolog 0-12 units tid, Jardiance 25 mg Daily Current orders for Inpatient glycemic control:  Levemir 8 units qhs Novolog 0-6 units tid  Inpatient Diabetes Program Recommendations:    -   If in the plan of care consider increasing Levemir to 12 units -  Increase Novolog to 0-9 units tid + hs  Thanks,  Tama Headings RN, MSN, BC-ADM Inpatient Diabetes Coordinator Team Pager [phone] (8a-5p)

## 2022-07-01 NOTE — ED Notes (Signed)
Patient given pillow to prop left leg up with. Patient comfortable at this time. Bed in lowest position, call light within reach.

## 2022-07-01 NOTE — ED Notes (Signed)
ED TO INPATIENT HANDOFF REPORT  ED Nurse Name and Phone #:   S Name/Age/Gender Lisabeth Register 77 y.o. female Room/Bed: 006C/006C  Code Status   Code Status: Full Code  Home/SNF/Other Home Patient oriented to: self, place, time, and situation Is this baseline? Yes   Triage Complete: Triage complete  Chief Complaint Vertigo [R42]  Triage Note BIBA from [facility] with c/o of dizziness and nausea since approx 3pm.  Sudden onset while sitting outside in [facility] with family   Allergies Allergies  Allergen Reactions   Kiwi Extract Anaphylaxis   Gabapentin Nausea And Vomiting   Tdap [Tetanus-Diphth-Acell Pertussis] Swelling and Other (See Comments)    Swelling at injection site, gets very hot   Latex Itching, Dermatitis and Rash   Statins Other (See Comments)    RHABDOMYOLYSIS   Tramadol Nausea And Vomiting    Level of Care/Admitting Diagnosis ED Disposition     ED Disposition  Admit   Condition  --   [place]: [place] [100100]  Level of Care: Telemetry Medical [104]  May place patient in observation at [facility] or [place] if equivalent level of care is available:: No  Covid Evaluation: Asymptomatic - no recent exposure (last 10 days) testing not required  Diagnosis: Vertigo [207257]  Admitting Physician: Rhetta Mura [id number]  Attending Physician: Rhetta Mura [id number]          B Medical/Surgery History Past Medical History:  Diagnosis Date   Chronic cystitis    Chronic diastolic CHF (congestive heart failure) ([place])    followed by dr Johnsie Cancel   Chronic kidney disease, stage I    Cirrhosis of liver without ascites ([place]) 03/2016   followed by dr [name] (GI);   due to fatty liver;   post op cabg 09/ 2017  , post paracentesis for ascites   Coronary artery disease 01/2016   cardiologist--- dr Frances Nickels;   01-27-2016  NSTEMI  s/p cardiac cath;   s/p CABG 01/29/16: LIMA-LAD, SVG-RI, SVG-dRCA   DDD  (degenerative disc disease), cervical    per pt w/ chronic neck pain   Dyspnea    occasional   Edema of right lower extremity    Esophageal varices ([place])    followed by dr [name];   hx multiple banding of large varices   Essential hypertension    no meds   GERD (gastroesophageal reflux disease)    Heart murmur     DX FOR YEARS ASYMPTOMATIC   Hepatic encephalopathy ([place])    History of adenomatous polyp of colon    History of COVID-19 04/2020   per pt mild  symptoms that resolve   History of kidney stones    passed stones   History of non-ST elevation myocardial infarction (NSTEMI) 01/27/2016   Hyperlipidemia    diet controlled   NAFLD (nonalcoholic fatty liver disease)    Neuropathy, peripheral 04/01/2015   OA (osteoarthritis)    Paroxysmal A-fib ([place])    08/19/19 afib with RVR, spontaneously converted to SR   Portal vein thrombosis    per dr [name]  ,  chronic non-occluded   Thrombocytopenia ([place])    followed by pcp  and hematology-- dr Burr Medico;   likely secondary to liver cirrhosis   Type 2 diabetes mellitus treated with insulin ([place])    Type 2, followed by pcp    (07-21-2021  per pt checks blood sugar dialy in am fasting average 113--200s)   Urge incontinence of urine  Wears hearing aid in both ears    Past Surgical History:  Procedure Laterality Date   CARDIAC CATHETERIZATION N/A 01/27/2016   Procedure: Left Heart Cath and Coronary Angiography;  Surgeon: Belva Crome, MD;  Location: [place] CV LAB;  Service: Cardiovascular;  Laterality: N/A;   COLONOSCOPY WITH PROPOFOL N/A 07/07/2016   Procedure: COLONOSCOPY WITH PROPOFOL;  Surgeon: [name], MD;  Location: [facility];  Service: Endoscopy;  Laterality: N/A;   CORONARY ARTERY BYPASS GRAFT N/A 01/28/2016   Procedure: CORONARY ARTERY BYPASS GRAFTING (CABG) x 3 USING RIGHT LEG GREATER SAPHENOUS VEIN GRAFT;  Surgeon: Melrose Nakayama, MD;  Location: [place];  Service: Open Heart Surgery;  Laterality: N/A;    CYSTOSCOPY WITH RETROGRADE PYELOGRAM, URETEROSCOPY AND STENT PLACEMENT Left 05/30/2021   Procedure: CYSTOSCOPY WITH RETROGRADE PYELOGRAM, URETEROSCOPY AND STENT PLACEMENT;  Surgeon: Ceasar Mons, MD;  Location: [place];  Service: Urology;  Laterality: Left;   CYSTOSCOPY/URETEROSCOPY/HOLMIUM LASER/STENT PLACEMENT Left 07/26/2021   Procedure: CYSTOSCOPY/RETROGRADE/URETEROSCOPY/HOLMIUM LASER/STENT EXCHANGE;  Surgeon: Janith Lima, MD;  Location: [facility];  Service: Urology;  Laterality: Left;   ENDOVEIN HARVEST OF GREATER SAPHENOUS VEIN Right 01/28/2016   Procedure: ENDOVEIN HARVEST OF GREATER SAPHENOUS VEIN;  Surgeon: Melrose Nakayama, MD;  Location: [place];  Service: Open Heart Surgery;  Laterality: Right;   ESOPHAGEAL BANDING  03/28/2019   Procedure: ESOPHAGEAL BANDING;  Surgeon: [name], MD;  [facility];  Service: Endoscopy;;   ESOPHAGEAL BANDING  04/07/2019   Procedure: ESOPHAGEAL BANDING;  Surgeon: Juanita Craver, MD;  Location: [facility]COPY;  Service: Endoscopy;;   ESOPHAGOGASTRODUODENOSCOPY N/A 04/07/2019   Procedure: ESOPHAGOGASTRODUODENOSCOPY (EGD);  Surgeon: Juanita Craver, MD;  Location[facility];  Service: Endoscopy;  Laterality: N/A;   ESOPHAGOGASTRODUODENOSCOPY (EGD) WITH PROPOFOL N/A 07/07/2016   Procedure: ESOPHAGOGASTRODUODENOSCOPY (EGD) WITH PROPOFOL;  Surgeon: [name], MD;  [facility];  Service: Endoscopy;  Laterality: N/A;   ESOPHAGOGASTRODUODENOSCOPY (EGD) WITH PROPOFOL N/A 03/28/2019   Procedure: ESOPHAGOGASTRODUODENOSCOPY (EGD) WITH PROPOFOL;  Surgeon: [name], MD;  [facility];  Service: Endoscopy;  Laterality: N/A;   EXTRACORPOREAL SHOCK WAVE LITHOTRIPSY  2010   HEMOSTASIS CLIP PLACEMENT  04/07/2019   Procedure: HEMOSTASIS CLIP PLACEMENT;  Surgeon: Juanita Craver, MD;  Location: [place];  Service: Endoscopy;;   IR ANGIOGRAM SELECTIVE EACH ADDITIONAL VESSEL  04/08/2019   IR EMBO  ART  VEN HEMORR LYMPH EXTRAV  INC GUIDE ROADMAPPING  04/08/2019   IR PARACENTESIS  04/08/2019   IR RADIOLOGIST EVAL & MGMT  06/13/2019   IR RADIOLOGIST EVAL & MGMT  09/25/2019   IR RADIOLOGIST EVAL & MGMT  07/07/2020   IR RADIOLOGIST EVAL & MGMT  07/30/2021   IR TIPS  04/08/2019   LAMINECTOMY WITH POSTERIOR LATERAL ARTHRODESIS LEVEL 1 Bilateral 02/25/2022   Procedure: Laminectomy and Foraminotomy - Lumbar four-Lumbar five - bilateral, posterolateral arthrodesis Lumbar four-five, extension of hardware Lumbar four-five;  Surgeon: Eustace Moore, MD;  Location: [place];  Service: Neurosurgery;  Laterality: Bilateral;   MAXIMUM ACCESS (MAS)POSTERIOR LUMBAR INTERBODY FUSION (PLIF) 1 LEVEL Left 06/10/2015   Procedure: FOR MAXIMUM ACCESS (MAS) POSTERIOR LUMBAR INTERBODY FUSION (PLIF) LUMBAR THREE-FOUR EXTRAFORAMINAL MICRODISCECTOMY LUMBAR FIVE-SACRAL ONE LEFT;  Surgeon: Eustace Moore, MD;  Location: [place] NEURO ORS;  Service: Neurosurgery;  Laterality: Left;   RADIOLOGY WITH ANESTHESIA N/A 04/08/2019   Procedure: RADIOLOGY WITH ANESTHESIA;  Surgeon: Radiologist, Medication, MD;  Location: [place];  Service: Radiology;  Laterality: N/A;   SCLEROTHERAPY  04/07/2019   Procedure: Clide Deutscher;  Surgeon: Juanita Craver, MD;  Location: [facility] ENDOSCOPY;  Service: Endoscopy;;   TEE WITHOUT CARDIOVERSION N/A 01/28/2016   Procedure: TRANSESOPHAGEAL ECHOCARDIOGRAM (TEE);  Surgeon: Melrose Nakayama, MD;  Location: [place];  Service: Open Heart Surgery;  Laterality: N/A;   TUBAL LIGATION  1982   Dr Lynwood Dawley HERNIA REPAIR N/A 09/10/2019   Procedure: HERNIA REPAIR UMBILICAL ADULT;  Surgeon: Donnie Mesa, MD;  Location: [place];  Service: General;  Laterality: N/A;   VAGINAL HYSTERECTOMY  1997   Dr Marta Lamas IV Location/Drains/Wounds Patient Lines/Drains/Airways Status     Active Line/Drains/Airways     Name Placement date Placement time Site Days   Peripheral IV 06/30/22 20 G Left;Anterior Forearm  06/30/22  1630  Forearm  1   External Urinary Catheter 06/30/22  2200  --  1   Wound / Incision (Open or Dehisced) 05/30/21 Other (Comment) Buttocks Right scabbed. 05/30/21  0120  Buttocks  397            Intake/Output Last 24 hours  Intake/Output Summary (Last 24 hours) at 07/01/2022 1250 Last data filed at 07/01/2022 0701 Gross per 24 hour  Intake --  Output 1000 ml  Net -1000 ml    Labs/Imaging Results for orders placed or performed during the hospital encounter of 06/30/22 (from the past 48 hour(s))  Troponin I (High Sensitivity)     Status: Abnormal   Collection Time: 06/30/22  5:15 PM  Result Value Ref Range   Troponin I (High Sensitivity) 20 (H) <18 ng/L    Comment: (NOTE) Elevated high sensitivity troponin I (hsTnI) values and significant  changes across serial measurements may suggest ACS but many other  chronic and acute conditions are known to elevate hsTnI results.  Refer to the "Links" section for chest pain algorithms and additional  guidance. Performed at [facility], [place] [address], [place], [place] [address]   CBC with Differential     Status: Abnormal   Collection Time: 06/30/22  5:15 PM  Result Value Ref Range   WBC 11.9 (H) 4.0 - 10.5 K/uL   RBC 3.70 (L) 3.87 - 5.11 MIL/uL   Hemoglobin 11.6 (L) 12.0 - 15.0 g/dL   HCT 34.6 (L) 36.0 - 46.0 %   MCV 93.5 80.0 - 100.0 fL   MCH 31.4 26.0 - 34.0 pg   MCHC 33.5 30.0 - 36.0 g/dL   RDW 17.1 (H) 11.5 - 15.5 %   Platelets 166 150 - 400 K/uL   nRBC 0.0 0.0 - 0.2 %   Neutrophils Relative % 88 %   Neutro Abs 10.5 (H) 1.7 - 7.7 K/uL   Lymphocytes Relative 4 %   Lymphs Abs 0.5 (L) 0.7 - 4.0 K/uL   Monocytes Relative 7 %   Monocytes Absolute 0.9 0.1 - 1.0 K/uL   Eosinophils Relative 0 %   Eosinophils Absolute 0.0 0.0 - 0.5 K/uL   Basophils Relative 0 %   Basophils Absolute 0.0 0.0 - 0.1 K/uL   Immature Granulocytes 1 %   Abs Immature Granulocytes 0.06 0.00 - 0.07 K/uL    Comment: Performed at [facility], [place] [address], [place], [place] [address]  Comprehensive metabolic panel     Status: Abnormal   Collection Time: 06/30/22  5:15 PM  Result Value Ref Range   Sodium 136 135 - 145 mmol/L   Potassium 3.6 3.5 - 5.1 mmol/L   Chloride  105 98 - 111 mmol/L   CO2 21 (L) 22 - 32 mmol/L   Glucose, Bld 178 (H) 70 - 99 mg/dL    Comment: Glucose reference range applies only to samples taken after fasting for at least 8 hours.   BUN 10 8 - 23 mg/dL   Creatinine, Ser 0.74 0.44 - 1.00 mg/dL   Calcium 8.4 (L) 8.9 - 10.3 mg/dL   Total Protein 6.7 6.5 - 8.1 g/dL   Albumin 2.0 (L) 3.5 - 5.0 g/dL   AST 61 (H) 15 - 41 U/L   ALT 33 0 - 44 U/L   Alkaline Phosphatase 125 38 - 126 U/L   Total Bilirubin 2.2 (H) 0.3 - 1.2 mg/dL   GFR, Estimated >60 >60 mL/min    Comment: (NOTE) Calculated using the CKD-EPI Creatinine Equation (2021)    Anion gap 10 5 - 15    Comment: Performed at [facility], [place] [address], [place], [place] [address]  Ammonia     Status: None   Collection Time: 06/30/22  5:15 PM  Result Value Ref Range   Ammonia 24 9 - 35 umol/L    Comment: Performed at [facility], [place] [address], [place], [place] [address]  Brain natriuretic peptide     Status: Abnormal   Collection Time: 06/30/22  5:15 PM  Result Value Ref Range   B Natriuretic Peptide 630.2 (H) 0.0 - 100.0 pg/mL    Comment: Performed at [facility] [address], [place], [place] [address]  Troponin I (High Sensitivity)     Status: Abnormal   Collection Time: 06/30/22  8:06 PM  Result Value Ref Range   Troponin I (High Sensitivity) 25 (H) <18 ng/L    Comment: (NOTE) Elevated high sensitivity troponin I (hsTnI) values and significant  changes across serial measurements may suggest ACS but many other  chronic and acute conditions are known to elevate hsTnI results.  Refer to the "Links" section for chest pain algorithms and additional  guidance. Performed at [facility], [place]  [address], [place], [place] [address]   Urinalysis, w/ Reflex to Culture (Infection Suspected) -Urine, Clean Catch     Status: Abnormal   Collection Time: 06/30/22  9:22 PM  Result Value Ref Range   Specimen Source URINE, CLEAN CATCH    Color, Urine YELLOW YELLOW   APPearance CLEAR CLEAR   Specific Gravity, Urine 1.039 (H) 1.005 - 1.030   pH 5.0 5.0 - 8.0   Glucose, UA >=500 (A) NEGATIVE mg/dL   Hgb urine dipstick MODERATE (A) NEGATIVE   Bilirubin Urine NEGATIVE NEGATIVE   Ketones, ur 5 (A) NEGATIVE mg/dL   Protein, ur NEGATIVE NEGATIVE mg/dL   Nitrite NEGATIVE NEGATIVE   Leukocytes,Ua NEGATIVE NEGATIVE   RBC / HPF 0-5 0 - 5 RBC/hpf   WBC, UA 0-5 0 - 5 WBC/hpf    Comment:        Reflex urine culture not performed if WBC <=10, OR if Squamous epithelial cells >5. If Squamous epithelial cells >5 suggest recollection.    Bacteria, UA NONE SEEN NONE SEEN   Squamous Epithelial / HPF 0-5 0 - 5 /HPF    Comment: Performed at [facility], [place] [address] [place], [place] [address]  CBC with Differential/Platelet     Status: Abnormal   Collection Time: 07/01/22  1:43 AM  Result Value Ref Range   WBC 13.5 (H) 4.0 - 10.5 K/uL   RBC  3.93 3.87 - 5.11 MIL/uL   Hemoglobin 12.1 12.0 - 15.0 g/dL   HCT 37.8 36.0 - 46.0 %   MCV 96.2 80.0 - 100.0 fL   MCH 30.8 26.0 - 34.0 pg   MCHC 32.0 30.0 - 36.0 g/dL   RDW 17.3 (H) 11.5 - 15.5 %   Platelets 197 150 - 400 K/uL   nRBC 0.0 0.0 - 0.2 %   Neutrophils Relative % 84 %   Neutro Abs 11.4 (H) 1.7 - 7.7 K/uL   Lymphocytes Relative 8 %   Lymphs Abs 1.1 0.7 - 4.0 K/uL   Monocytes Relative 6 %   Monocytes Absolute 0.8 0.1 - 1.0 K/uL   Eosinophils Relative 1 %   Eosinophils Absolute 0.1 0.0 - 0.5 K/uL   Basophils Relative 0 %   Basophils Absolute 0.0 0.0 - 0.1 K/uL   Immature Granulocytes 1 %   Abs Immature Granulocytes 0.08 (H) 0.00 - 0.07 K/uL    Comment: Performed at [facility] [address], [place], [place] [address]  Comprehensive  metabolic panel     Status: Abnormal   Collection Time: 07/01/22  1:43 AM  Result Value Ref Range   Sodium 136 135 - 145 mmol/L   Potassium 4.0 3.5 - 5.1 mmol/L   Chloride 105 98 - 111 mmol/L   CO2 20 (L) 22 - 32 mmol/L   Glucose, Bld 152 (H) 70 - 99 mg/dL    Comment: Glucose reference range applies only to samples taken after fasting for at least 8 hours.   BUN 13 8 - 23 mg/dL   Creatinine, Ser 0.80 0.44 - 1.00 mg/dL   Calcium 8.4 (L) 8.9 - 10.3 mg/dL   Total Protein 6.6 6.5 - 8.1 g/dL   Albumin 1.9 (L) 3.5 - 5.0 g/dL   AST 53 (H) 15 - 41 U/L   ALT 30 0 - 44 U/L   Alkaline Phosphatase 116 38 - 126 U/L   Total Bilirubin 2.6 (H) 0.3 - 1.2 mg/dL   GFR, Estimated >60 >60 mL/min    Comment: (NOTE) Calculated using the CKD-EPI Creatinine Equation (2021)    Anion gap 11 5 - 15    Comment: Performed at [facility], [place] [address] [place], [place] [address]  Magnesium     Status: None   Collection Time: 07/01/22  1:43 AM  Result Value Ref Range   Magnesium 1.9 1.7 - 2.4 mg/dL    Comment: Performed at [facility] [address] [place], [place] [address]  Protime-INR     Status: Abnormal   Collection Time: 07/01/22  1:43 AM  Result Value Ref Range   Prothrombin Time 15.3 (H) 11.4 - 15.2 seconds   INR 1.2 0.8 - 1.2    Comment: (NOTE) INR goal varies based on device and disease states. Performed at [facility], [place] [address] [place], [place] [address]   POC CBG, ED     Status: Abnormal   Collection Time: 07/01/22  2:45 AM  Result Value Ref Range   Glucose-Capillary 190 (H) 70 - 99 mg/dL    Comment: Glucose reference range applies only to samples taken after fasting for at least 8 hours.  CBG monitoring, ED     Status: Abnormal   Collection Time: 07/01/22  7:54 AM  Result Value Ref Range   Glucose-Capillary 203 (H) 70 - 99 mg/dL    Comment: Glucose reference range applies only to samples taken after  fasting for at least 8 hours.  CBG monitoring, ED      Status: Abnormal   Collection Time: 07/01/22 11:51 AM  Result Value Ref Range   Glucose-Capillary 233 (H) 70 - 99 mg/dL    Comment: Glucose reference range applies only to samples taken after fasting for at least 8 hours.   *Note: Due to a large number of results and/or encounters for the requested time period, some results have not been displayed. A complete set of results can be found in Results Review.   MR BRAIN WO CONTRAST  Result Date: 06/30/2022 CLINICAL DATA:  Horizontal nystagmus.  Dizziness and nausea. EXAM: MRI HEAD WITHOUT CONTRAST TECHNIQUE: Multiplanar, multiecho pulse sequences of the brain and surrounding structures were obtained without intravenous contrast. COMPARISON:  CT Head 05/28/22 FINDINGS: Brain: No acute infarction, hemorrhage, hydrocephalus, extra-axial collection or mass lesion. Sequela of moderate chronic microvascular ischemic change. There is T2/FLAIR hyperintense signal in the subcortical white matter in the bilateral parietal lobes, which is also favored to represent sequela of chronic ischemic change. Vascular: Right A1 flow void is not visualized, possibly congenital Skull and upper cervical spine: Normal marrow signal. Sinuses/Orbits: Trace right mastoid effusion. Mild mucosal thickening bilateral ethmoid air cells. Other: None. IMPRESSION: No acute intracranial process. Electronically Signed   By: Marin Roberts M.D.   On: 06/30/2022 20:03    Pending Labs Unresulted Labs (From admission, onward)    None       Vitals/Pain Today's Vitals   07/01/22 0652 07/01/22 0730 07/01/22 1030 07/01/22 1153  BP:  132/62 129/69 132/62  Pulse:  78 80 81  Resp:  '16 13 18  '$ Temp: 98.8 F (37.1 C)   99 F (37.2 C)  TempSrc: Oral   Oral  SpO2:  98% 98% 99%  Weight:      Height:      PainSc:        Isolation Precautions No active isolations  Medications Medications  acetaminophen (TYLENOL) tablet 650 mg (650 mg Oral Given 07/01/22 1203)    Or  acetaminophen  (TYLENOL) suppository 650 mg ( Rectal See Alternative 07/01/22 1203)  melatonin tablet 3 mg (has no administration in time range)  meclizine (ANTIVERT) tablet 25 mg (has no administration in time range)  ezetimibe (ZETIA) tablet 10 mg (10 mg Oral Given 07/01/22 1202)  lactulose (CHRONULAC) 10 GM/15ML solution 30 g (30 g Oral Given 07/01/22 1202)  LORazepam (ATIVAN) tablet 0.5 mg (has no administration in time range)  pantoprazole (PROTONIX) EC tablet 40 mg (40 mg Oral Given 07/01/22 1203)  rifaximin (XIFAXAN) tablet 550 mg (550 mg Oral Given 07/01/22 1202)  insulin detemir (LEVEMIR) injection 8 Units (8 Units Subcutaneous Given 07/01/22 0319)  insulin aspart (novoLOG) injection 0-6 Units (2 Units Subcutaneous Given 07/01/22 1211)  ondansetron (ZOFRAN) injection 4 mg (has no administration in time range)  furosemide (LASIX) tablet 40 mg (has no administration in time range)  meclizine (ANTIVERT) tablet 25 mg (25 mg Oral Given 06/30/22 1744)  furosemide (LASIX) injection 40 mg (40 mg Intravenous Given 07/01/22 0052)  furosemide (LASIX) injection 40 mg (40 mg Intravenous Given 07/01/22 1203)    Followed by  albumin human 25 % solution 12.5 g (12.5 g Intravenous New Bag/Given 07/01/22 1203)    Mobility walks with device     Focused Assessments Neuro Assessment Handoff:  Swallow screen pass?  Cardiac Rhythm: Normal sinus rhythm       Neuro Assessment: Within Defined Limits Neuro Checks:  Has TPA been given? No If patient is a Neuro Trauma and patient is going to OR before floor call report to [facility] nurse: [phone] or [phone]   R Recommendations: See Admitting Provider Note  Report given to:   Additional Notes:

## 2022-07-01 NOTE — ED Notes (Signed)
RN assisted patient to the bathroom in a wheelchair. Patient very weak, and unsteady.

## 2022-07-02 DIAGNOSIS — R42 Dizziness and giddiness: Secondary | ICD-10-CM | POA: Diagnosis not present

## 2022-07-02 DIAGNOSIS — L03116 Cellulitis of left lower limb: Secondary | ICD-10-CM

## 2022-07-02 LAB — GLUCOSE, CAPILLARY
Glucose-Capillary: 60 mg/dL — ABNORMAL LOW (ref 70–99)
Glucose-Capillary: 74 mg/dL (ref 70–99)
Glucose-Capillary: 157 mg/dL — ABNORMAL HIGH (ref 70–99)
Glucose-Capillary: 298 mg/dL — ABNORMAL HIGH (ref 70–99)
Glucose-Capillary: 197 mg/dL — ABNORMAL HIGH (ref 70–99)

## 2022-07-02 MED ORDER — INSULIN DETEMIR 100 UNIT/ML ~~LOC~~ SOLN
12.0000 [IU] | Freq: Every day | SUBCUTANEOUS | Status: DC
  Administered 2022-07-02 – 2022-07-03 (×2): 12 [IU] via SUBCUTANEOUS
  Filled 2022-07-02 (×2): qty 0.12

## 2022-07-02 MED ORDER — CEFAZOLIN SODIUM-DEXTROSE 2-4 GM/100ML-% IV SOLN
2.0000 g | Freq: Three times a day (TID) | INTRAVENOUS | Status: DC
  Administered 2022-07-02 – 2022-07-04 (×7): 2 g via INTRAVENOUS
  Filled 2022-07-02 (×6): qty 100

## 2022-07-02 NOTE — Progress Notes (Signed)
PROGRESS NOTE  [name] [id number] DOB: Dec 18, 1945   PCP: [name], MD  Patient is from: SNF  DOA: 06/30/2022 LOS: 1  Brief Narrative: 77 year old F with PMH of NASH cirrhosis s/p TIPs, diastolic CHF, chronic edema, DM-2, HLD, anxiety and lumbar fusion presenting with sudden onset dizziness (vertigo) and nausea, and admitted for vertigo earlier this morning.  Workup in ED including MRI brain unrevealing.  BNP elevated to 630 (slightly higher than baseline).  UA without significant finding.  Patient was admitted for vestibular PT evaluation.  Started on IV Lasix for BLE edema. On further interview, patient states she was started on low-dose oxycodone recently.    Subjective: Patient complains of pain in the left lower extremity.  No further episodes of dizziness or lightheadedness.  Has not really ambulated.  Usually independent with ambulation at home.  Occasionally uses a cane.   Objective: Vitals:   07/02/22 0400 07/02/22 0500 07/02/22 0734 07/02/22 0812  BP: 124/62  (!) 116/53 125/62  Pulse:   84 86  Resp:   18 20  Temp: 98.5 F (36.9 C)  98.6 F (37 C)   TempSrc: Oral  Oral   SpO2: 93%  94% 94%  Weight:  63.5 kg    Height:        Examination:  General appearance: Awake alert.  In no distress Resp: Clear to auscultation bilaterally.  Normal effort Cardio: S1-S2 is normal regular.  No S3-S4.  No rubs murmurs or bruit GI: Abdomen is soft.  Nontender nondistended.  Bowel sounds are present normal.  No masses organomegaly Extremities: Erythema noted in the lower half of the left leg posteriorly.  Warmth is present. Neurologic: Alert and oriented x3.  No focal neurological deficits.    Assessment and plan:  BPPV MRI without acute finding.  She states she was recently started on low-dose oxycodone.  No recent URI symptoms.  No prior history of vertigo.  Appears to be better.  Seen by PT but was not ambulated.  Will wait for PT to reevaluate to see  how she does with ambulation.  Patient mentions that she lives at home with her husband.  Left lower extremity cellulitis Complaining of pain in the left leg.  Underwent Doppler studies which did not show any DVT.  Seems to have developed erythema which was not present yesterday according to patient.  Will place her on antibiotics.  WBC was noted to be elevated yesterday.  She is noted to be afebrile. No antibiotic allergies noted. Will treat her with cefazolin for today and then transition to oral antibiotics tomorrow.  Acute on chronic diastolic CHF TTE in [id number] with LVEF of 60 to 65%, G2-DD and normal RVSP.   She was noted to have significant lower extremity edema.  Treated with IV furosemide.  Edema seems to be improving.  Continue with oral furosemide for now.  See above regarding cellulitis.    NASH Cirrhosis s/p TIPS: No signs of SBP.  No encephalopathy. -Diuretics as above. -Continue home rifaximin and lactulose.  Right hip pain/right calf pain/BLE edema No DVT noted on Doppler study.  Pain is likely due to cellulitis.  Uncontrolled IDDM-2 with hyperglycemia A1c 9.2% in 03/2022.  On Tresiba 25 units at night, SSI, Jardiance Dose of glargine was increased.  Noted to have low glucose levels this morning.  Will cut back on the dose of glargine.  History of CAD/CABG x 3 -Continue home meds.  Elevated troponin Mild.  Likely demand ischemia.  Hypoalbuminemia:  Anxiety/insomnia: -Continue home meds.  Physical deconditioning/generalized weakness -PT/OT  DVT prophylaxis: Lovenox Code Status: Full code Family Communication: Updated patient's son at bedside. Disposition: Hopefully return home when improved.  Consultants:  None   Sch Meds:  Scheduled Meds:  aspirin EC  81 mg Oral Daily   empagliflozin  25 mg Oral Daily   enoxaparin (LOVENOX) injection  40 mg Subcutaneous Q24H   ezetimibe  10 mg Oral Daily   furosemide  40 mg Oral Daily   insulin aspart  0-15 Units  Subcutaneous TID WC   insulin aspart  0-5 Units Subcutaneous QHS   insulin detemir  15 Units Subcutaneous QHS   lactulose  30 g Oral Daily   pantoprazole  40 mg Oral Daily   rifaximin  550 mg Oral BID   traZODone  25 mg Oral QHS   Continuous Infusions: PRN Meds:.acetaminophen **OR** acetaminophen, LORazepam, meclizine, melatonin, ondansetron (ZOFRAN) IV  Antimicrobials: Anti-infectives (From admission, onward)    Start     Dose/Rate Route Frequency Ordered Stop   07/01/22 1000  rifaximin (XIFAXAN) tablet 550 mg        550 mg Oral 2 times daily 07/01/22 0126          CBC: Recent Labs  Lab 06/30/22 1715 07/01/22 0143  WBC 11.9* 13.5*  NEUTROABS 10.5* 11.4*  HGB 11.6* 12.1  HCT 34.6* 37.8  MCV 93.5 96.2  PLT 166 197    BMP &GFR Recent Labs  Lab 06/30/22 1715 07/01/22 0143  NA 136 136  K 3.6 4.0  CL 105 105  CO2 21* 20*  GLUCOSE 178* 152*  BUN 10 13  CREATININE 0.74 0.80  CALCIUM 8.4* 8.4*  MG  --  1.9    Estimated Creatinine Clearance: 54.5 mL/min (by C-G formula based on SCr of 0.8 mg/dL). Liver & Pancreas: Recent Labs  Lab 06/30/22 1715 07/01/22 0143  AST 61* 53*  ALT 33 30  ALKPHOS 125 116  BILITOT 2.2* 2.6*  PROT 6.7 6.6  ALBUMIN 2.0* 1.9*     Recent Labs  Lab 06/30/22 1715  AMMONIA 24    Diabetic:  Recent Labs  Lab 07/01/22 1603 07/01/22 2236 07/02/22 0541 07/02/22 0555 07/02/22 0733  GLUCAP 309* 226* 60* 74 157*     Coagulation Profile: Recent Labs  Lab 07/01/22 0143  INR 1.2     Urine analysis:    Component Value Date/Time   COLORURINE YELLOW 06/30/2022 2122   APPEARANCEUR CLEAR 06/30/2022 2122   LABSPEC 1.039 (H) 06/30/2022 2122   PHURINE 5.0 06/30/2022 2122   GLUCOSEU >=500 (A) 06/30/2022 2122   HGBUR MODERATE (A) 06/30/2022 2122   BILIRUBINUR NEGATIVE 06/30/2022 2122   BILIRUBINUR Negative 03/24/2022 1031   KETONESUR 5 (A) 06/30/2022 2122   PROTEINUR NEGATIVE 06/30/2022 2122   UROBILINOGEN 0.2 03/24/2022  1031   UROBILINOGEN 0.2 12/18/2011 1409   NITRITE NEGATIVE 06/30/2022 2122   LEUKOCYTESUR NEGATIVE 06/30/2022 2122    Microbiology: No results found for this or any previous visit (from the past 240 hour(s)).  Radiology Studies: VAS [place] LOWER EXTREMITY VENOUS (DVT)  Result Date: 07/01/2022  Lower Venous DVT Study Patient Name:  CHEVELLA RUBI [name]  Date of Exam:   07/01/2022 Medical Rec #: [id number]               Accession #:    [id number] Date of Birth: November 27, 1945               Patient Gender: F Patient Age:  76 years Exam Location:  [facility] Procedure:      VAS [place] LOWER EXTREMITY VENOUS (DVT) Referring Phys: TAYE GONFA --------------------------------------------------------------------------------  Indications: Left leg swelling.  Limitations: Poor ultrasound/tissue interface. Comparison Study: Previous exam on 06/02/21 was negative for DVT Performing Technologist: Rogelia Rohrer RVT, RDMS  Examination Guidelines: A complete evaluation includes B-mode imaging, spectral Doppler, color Doppler, and power Doppler as needed of all accessible portions of each vessel. Bilateral testing is considered an integral part of a complete examination. Limited examinations for reoccurring indications may be performed as noted. The reflux portion of the exam is performed with the patient in reverse Trendelenburg.  +---------+---------------+---------+-----------+----------+-------------------+ RIGHT    CompressibilityPhasicitySpontaneityPropertiesThrombus Aging      +---------+---------------+---------+-----------+----------+-------------------+ CFV      Full           No       Yes                                      +---------+---------------+---------+-----------+----------+-------------------+ SFJ      Full                                                             +---------+---------------+---------+-----------+----------+-------------------+ FV Prox  Full           No       Yes                                       +---------+---------------+---------+-----------+----------+-------------------+ FV Mid   Full           No       Yes                                      +---------+---------------+---------+-----------+----------+-------------------+ FV DistalFull           No       Yes                                      +---------+---------------+---------+-----------+----------+-------------------+ PFV      Full                                                             +---------+---------------+---------+-----------+----------+-------------------+ POP      Full           No       Yes                                      +---------+---------------+---------+-----------+----------+-------------------+ PTV      Full                                                             +---------+---------------+---------+-----------+----------+-------------------+  PERO                                                  Not well visualized +---------+---------------+---------+-----------+----------+-------------------+ pulsatile flow  +---------+---------------+---------+-----------+----------+--------------+ LEFT     CompressibilityPhasicitySpontaneityPropertiesThrombus Aging +---------+---------------+---------+-----------+----------+--------------+ CFV      Full           No       Yes                                 +---------+---------------+---------+-----------+----------+--------------+ SFJ      Full                                                        +---------+---------------+---------+-----------+----------+--------------+ FV Prox  Full           No       Yes                                 +---------+---------------+---------+-----------+----------+--------------+ FV Mid   Full           No       Yes                                 +---------+---------------+---------+-----------+----------+--------------+ FV  DistalFull           No       Yes                                 +---------+---------------+---------+-----------+----------+--------------+ PFV      Full                                                        +---------+---------------+---------+-----------+----------+--------------+ POP      Full           No       Yes                                 +---------+---------------+---------+-----------+----------+--------------+ PTV      Full                                                        +---------+---------------+---------+-----------+----------+--------------+ PERO     Full                                                        +---------+---------------+---------+-----------+----------+--------------+ pulsatile flow  Summary: BILATERAL: - No evidence of deep vein thrombosis seen in the lower extremities, bilaterally. -No evidence of popliteal cyst, bilaterally. -Subcutaneous edema, bilaterally  *See table(s) above for measurements and observations. Electronically signed by Orlie Pollen on 07/01/2022 at 6:55:24 PM.    Final    DG HIP UNILAT WITH PELVIS 2-3 VIEWS LEFT  Result Date: 07/01/2022 CLINICAL DATA:  Left hip pain, recent fall in January EXAM: DG HIP (WITH OR WITHOUT PELVIS) 2-3V LEFT COMPARISON:  05/27/2022 FINDINGS: Bones are osteopenic. Fusion hardware noted of the visualized lower lumbar spine. Bony pelvis and hips appear symmetric and intact. No displaced hip fracture or malalignment. Nonobstructive bowel gas pattern. IMPRESSION: Osteopenia. No acute finding by plain radiography. Electronically Signed   By: Jerilynn Mages.  Shick M.D.   On: 07/01/2022 14:44      [facility]  If 7PM-7AM, please contact night-coverage [email or web address] 07/02/2022, 10:37 AM

## 2022-07-02 NOTE — Progress Notes (Signed)
Pharmacy Antibiotic Note  [name] is a 77 y.o. female admitted on 06/30/2022 with vertigo and pain in the lower left extremity with concern for cellulitis. Pharmacy has been consulted for cefazolin dosing. SCr 0.8 and around baseline. CrCl 54.5 ml/min. WBC 13.5 and afebrile.   Plan: Cefazolin 2 grams IV q8h Follow WBC and temperature curve  Height: '5\' 3"'$  (160 cm) Weight: 63.5 kg (139 lb 15.9 oz) IBW/kg (Calculated) : 52.4  Temp (24hrs), Avg:98.6 F (37 C), Min:98.2 F (36.8 C), Max:99 F (37.2 C)  Recent Labs  Lab 06/30/22 1715 07/01/22 0143  WBC 11.9* 13.5*  CREATININE 0.74 0.80    Estimated Creatinine Clearance: 54.5 mL/min (by C-G formula based on SCr of 0.8 mg/dL).    Allergies  Allergen Reactions   Kiwi Extract Anaphylaxis   Gabapentin Nausea And Vomiting   Tdap [Tetanus-Diphth-Acell Pertussis] Swelling and Other (See Comments)    Swelling at injection site, gets very hot   Latex Itching, Dermatitis and Rash   Statins Other (See Comments)    RHABDOMYOLYSIS   Tramadol Nausea And Vomiting    Antimicrobials this admission: Cefazolin 2/24 >>  Rifaximin continued from prior to admission  Dose adjustments this admission:  Thank you for allowing pharmacy to be a part of this patient's care.  [name], Pharm.D PGY1 Pharmacy Resident 07/02/2022 11:13 AM

## 2022-07-02 NOTE — Plan of Care (Signed)

## 2022-07-02 NOTE — Progress Notes (Signed)
Physical Therapy Treatment Patient Details Name: Tambi Odaniel MRN: [id number] DOB: Mar 10, 1946 Today's Date: 07/02/2022   History of Present Illness Aaleeyah Croslin is a 77 y.o. female admitted 2/22   after presenting from home to [facility] ED complaining of vertigo. PMH: [name] cirrhosis, chronic diastolic heart failure, chronic edema of the right lower extremity, hyperlipidemia, type 2 diabetes mellitus, GAD    PT Comments    Pt is limited by reports of neck and L leg pain during this session. Pt has a poor tolerance for weightbearing through LLE as well as a poor tolerance for cervical ROM, impairing her ability to perform epley maneuver to address potential BPPV. Pt will benefit from further assessment of mobility with improved timing around tylenol dosage as the pt reports this typically significantly reduces her discomfort. Pt does report improvement in symptoms of dizziness, with only mild dizziness when returning from standing to sitting today, otherwise without symptoms. PT updates recommendations to SNF as the pt reports she was at [facility] prior to this admission. PT remains at a high falls risk due to poor tolerance for weightbearing along with dizziness.   Recommendations for follow up therapy are one component of a multi-disciplinary discharge planning process, led by the attending physician.  Recommendations may be updated based on patient status, additional functional criteria and insurance authorization.  Follow Up Recommendations  Skilled nursing-short term rehab (<3 hours/day) Can patient physically be transported by private vehicle: No   Assistance Recommended at Discharge Frequent or constant Supervision/Assistance  Patient can return home with the following A lot of help with walking and/or transfers;A lot of help with bathing/dressing/bathroom;Assistance with cooking/housework;Assist for transportation;Help with stairs or ramp for entrance   Equipment Recommendations   None recommended by PT    Recommendations for Other Services       Precautions / Restrictions Precautions Precautions: Fall Precaution Comments: vertigo, L leg infection Restrictions Weight Bearing Restrictions: No     Mobility  Bed Mobility Overal bed mobility: Needs Assistance Bed Mobility: Rolling, Sidelying to Sit, Sit to Supine Rolling: Min guard Sidelying to sit: Min guard   Sit to supine: Min assist        Transfers Overall transfer level: Needs assistance Equipment used: Rolling walker (2 wheels) Transfers: Sit to/from Stand Sit to Stand: Min assist                Ambulation/Gait             Pre-gait activities: pt with poor tolerance for weightbearing through LLE, requires minA to maintain stability with attempts to march in place     Stairs             Wheelchair Mobility    Modified Rankin (Stroke Patients Only)       Balance Overall balance assessment: Needs assistance Sitting-balance support: No upper extremity supported, Feet supported Sitting balance-Leahy Scale: Fair     Standing balance support: Bilateral upper extremity supported, Reliant on assistive device for balance Standing balance-Leahy Scale: Poor                              Cognition Arousal/Alertness: Awake/alert Behavior During Therapy: WFL for tasks assessed/performed Overall Cognitive Status: Within Functional Limits for tasks assessed  Exercises      General Comments General comments (skin integrity, edema, etc.): VSS on RA. PT attempts epley maneuver to address R posterior canal  however pt is unable to tolerate cervical ROM to perform method.      Pertinent Vitals/Pain Pain Assessment Pain Assessment: 0-10 Pain Score: 7  Pain Location: LLE, neck Pain Descriptors / Indicators: Aching Pain Intervention(s): Monitored during session    Home Living                           Prior Function            PT Goals (current goals can now be found in the care plan section) Acute Rehab PT Goals Patient Stated Goal: improve ability to ambulate Progress towards PT goals: Not progressing toward goals - comment (limited by leg and neck pain)    Frequency    Min 3X/week      PT Plan Discharge plan needs to be updated    Co-evaluation              AM-PAC PT "6 Clicks" Mobility   Outcome Measure  Help needed turning from your back to your side while in a flat bed without using bedrails?: A Little Help needed moving from lying on your back to sitting on the side of a flat bed without using bedrails?: A Little Help needed moving to and from a bed to a chair (including a wheelchair)?: A Little Help needed standing up from a chair using your arms (e.g., wheelchair or bedside chair)?: A Little Help needed to walk in hospital room?: Total Help needed climbing 3-5 steps with a railing? : Total 6 Click Score: 14    End of Session Equipment Utilized During Treatment: Gait belt Activity Tolerance: Patient limited by pain Patient left: in bed;with call bell/phone within reach;with bed alarm set Nurse Communication: Mobility status PT Visit Diagnosis: Unsteadiness on feet (R26.81);Muscle weakness (generalized) (M62.81)     Time: [id number] PT Time Calculation (min) (ACUTE ONLY): 32 min  Charges:  $Therapeutic Activity: 23-37 mins                     Zenaida Niece, PT, DPT Acute Rehabilitation Office [place] Tanylah Schnoebelen 07/02/2022, 4:21 PM

## 2022-07-02 NOTE — Evaluation (Signed)
Occupational Therapy Evaluation Patient Details Name: [name] MRN: [id number] DOB: 12-01-1945 Today's Date: 07/02/2022   History of Present Illness [name] is a 77 y.o. female admitted 2/22   after presenting from home to [facility] ED complaining of vertigo. PMH: [name] cirrhosis, chronic diastolic heart failure, chronic edema of the right lower extremity, hyperlipidemia, type 2 diabetes mellitus, GAD   Clinical Impression   Pt admitted with vertigo. Pt currently with functional limitations due to the deficits listed below (see OT Problem List).  Pt will benefit from skilled OT to increase their safety and independence with ADL and functional mobility for ADL to facilitate discharge to venue listed below.        Recommendations for follow up therapy are one component of a multi-disciplinary discharge planning process, led by the attending physician.  Recommendations may be updated based on patient status, additional functional criteria and insurance authorization.   Follow Up Recommendations  Skilled nursing-short term rehab (<3 hours/day)     Assistance Recommended at Discharge Frequent or constant Supervision/Assistance  Patient can return home with the following A lot of help with walking and/or transfers;A lot of help with bathing/dressing/bathroom    Functional Status Assessment  Patient has had a recent decline in their functional status and demonstrates the ability to make significant improvements in function in a reasonable and predictable amount of time.  Equipment Recommendations  Other (comment)    Recommendations for Other Services       Precautions / Restrictions Precautions Precautions: Fall Restrictions Weight Bearing Restrictions: No      Mobility Bed Mobility Overal bed mobility: Needs Assistance Bed Mobility: Supine to Sit Rolling: Mod assist Sidelying to sit: Mod assist       General bed mobility comments: did not stand.  pt  returned to supnine after sitting EOB grooming at she needed on bed pan    Transfers                          Balance Overall balance assessment: Needs assistance Sitting-balance support: No upper extremity supported, Feet supported Sitting balance-Leahy Scale: Fair         Standing balance comment: reliant on UE support                           ADL either performed or assessed with clinical judgement   ADL Overall ADL's : Needs assistance/impaired Eating/Feeding: Set up;Sitting   Grooming: Wash/dry hands;Oral care;Sitting Grooming Details (indicate cue type and reason): sitting EOB Upper Body Bathing: Minimal assistance;Sitting   Lower Body Bathing: Sitting/lateral leans;Maximal assistance   Upper Body Dressing : Minimal assistance;Sitting   Lower Body Dressing: Maximal assistance;Sitting/lateral leans     Toilet Transfer Details (indicate cue type and reason): pt urgently needed to have BM.  transitioned back to bed and placed on bed pan.  RN notified. Toileting- Clothing Manipulation and Hygiene: Bed level;Maximal assistance         General ADL Comments: pt stated ' i sure didnt want that laxative'     Vision Patient Visual Report: No change from baseline       Perception     Praxis      Pertinent Vitals/Pain Pain Assessment Pain Assessment: 0-10 Pain Score: 3  Pain Location: LLE     Hand Dominance Right   Extremity/Trunk Assessment Upper Extremity Assessment Upper Extremity Assessment: Generalized weakness  Cervical / Trunk Assessment Cervical / Trunk Assessment: Kyphotic   Communication Communication Communication: HOH   Cognition   Behavior During Therapy: WFL for tasks assessed/performed Overall Cognitive Status: Within Functional Limits for tasks assessed                                       General Comments   Pt left on bed pan. OT spoke to RN.  Pt wanted to stay on bed pan for some time  after laxative and did not want pure wick removed,  RN verbalized understanding and aware pt will call when ready.            Home Living Family/patient expects to be discharged to:: Skilled nursing facility Living Arrangements: Spouse/significant other Available Help at Discharge: Family;Available 24 hours/day Type of Home: House Home Access: Stairs to enter [employer] of Steps: 4 Entrance Stairs-Rails: Right;Left Home Layout: One level     Bathroom Shower/Tub: Occupational psychologist: Handicapped height Bathroom Accessibility: Yes   Home Equipment: Shower seat;Grab bars - tub/shower;Rolling Environmental consultant (2 wheels);Toilet riser;Cane - quad          Prior Functioning/Environment Prior Level of Function : Independent/Modified Independent;Driving             Mobility Comments: Communiity ambulator that used quad cane at times ADLs Comments: Does ADLs and IADLs        OT Problem List: Decreased strength;Decreased activity tolerance;Impaired balance (sitting and/or standing)      OT Treatment/Interventions: Self-care/ADL training;Therapeutic activities    OT Goals(Current goals can be found in the care plan section) Acute Rehab OT Goals Patient Stated Goal: get stronger OT Goal Formulation: With patient Time For Goal Achievement: 07/16/22 Potential to Achieve Goals: Good ADL Goals Pt Will Perform Grooming: with set-up;standing Pt Will Transfer to Toilet: with min guard assist;stand pivot transfer Pt Will Perform Toileting - Clothing Manipulation and hygiene: with min assist;sit to/from stand  OT Frequency: Min 2X/week    Co-evaluation              AM-PAC OT "6 Clicks" Daily Activity     Outcome Measure Help from another person eating meals?: A Little Help from another person taking care of personal grooming?: A Little Help from another person toileting, which includes using toliet, bedpan, or urinal?: Total Help from another person  bathing (including washing, rinsing, drying)?: A Lot Help from another person to put on and taking off regular upper body clothing?: A Lot Help from another person to put on and taking off regular lower body clothing?: Total 6 Click Score: 12   End of Session    Activity Tolerance: Other (comment) (limited by need to be on bed pan) Patient left:    OT Visit Diagnosis: Unsteadiness on feet (R26.81);Other abnormalities of gait and mobility (R26.89);Muscle weakness (generalized) (M62.81)                Time: [id number] OT Time Calculation (min): 27 min Charges:  OT General Charges $OT Visit: 1 Visit OT Evaluation $OT Eval Moderate Complexity: 1 [place], [place]  [phone], Edwena Felty D 07/02/2022, 11:27 AM

## 2022-07-03 DIAGNOSIS — R42 Dizziness and giddiness: Secondary | ICD-10-CM | POA: Diagnosis not present

## 2022-07-03 DIAGNOSIS — I471 Supraventricular tachycardia, unspecified: Secondary | ICD-10-CM

## 2022-07-03 DIAGNOSIS — L03116 Cellulitis of left lower limb: Secondary | ICD-10-CM | POA: Diagnosis not present

## 2022-07-03 LAB — CBC
HCT: 31.5 % — ABNORMAL LOW (ref 36.0–46.0)
Hemoglobin: 10.6 g/dL — ABNORMAL LOW (ref 12.0–15.0)
MCH: 31 pg (ref 26.0–34.0)
MCHC: 33.7 g/dL (ref 30.0–36.0)
MCV: 92.1 fL (ref 80.0–100.0)
Platelets: 150 10*3/uL (ref 150–400)
RBC: 3.42 MIL/uL — ABNORMAL LOW (ref 3.87–5.11)
RDW: 17 % — ABNORMAL HIGH (ref 11.5–15.5)
WBC: 7.7 10*3/uL (ref 4.0–10.5)
nRBC: 0 % (ref 0.0–0.2)

## 2022-07-03 LAB — GLUCOSE, CAPILLARY
Glucose-Capillary: 88 mg/dL (ref 70–99)
Glucose-Capillary: 176 mg/dL — ABNORMAL HIGH (ref 70–99)
Glucose-Capillary: 257 mg/dL — ABNORMAL HIGH (ref 70–99)
Glucose-Capillary: 306 mg/dL — ABNORMAL HIGH (ref 70–99)

## 2022-07-03 LAB — BASIC METABOLIC PANEL
Anion gap: 7 (ref 5–15)
BUN: 11 mg/dL (ref 8–23)
CO2: 23 mmol/L (ref 22–32)
Calcium: 7.8 mg/dL — ABNORMAL LOW (ref 8.9–10.3)
Chloride: 106 mmol/L (ref 98–111)
Creatinine, Ser: 0.72 mg/dL (ref 0.44–1.00)
GFR, Estimated: >60 mL/min (ref >60)
Glucose, Bld: 134 mg/dL — ABNORMAL HIGH (ref 70–99)
Potassium: 3.3 mmol/L — ABNORMAL LOW (ref 3.5–5.1)
Sodium: 136 mmol/L (ref 135–145)

## 2022-07-03 LAB — MAGNESIUM: Magnesium: 1.8 mg/dL (ref 1.7–2.4)

## 2022-07-03 MED ORDER — METOPROLOL TARTRATE 12.5 MG HALF TABLET: 12.5000 mg | ORAL_TABLET | Freq: Two times a day (BID) | ORAL | Status: DC

## 2022-07-03 MED ORDER — POTASSIUM CHLORIDE CRYS ER 20 MEQ PO TBCR
40.0000 meq | EXTENDED_RELEASE_TABLET | Freq: Three times a day (TID) | ORAL | Status: AC
  Administered 2022-07-03 (×2): 40 meq via ORAL
  Filled 2022-07-03 (×2): qty 2

## 2022-07-03 MED ORDER — OXYCODONE HCL 5 MG PO TABS
5.0000 mg | ORAL_TABLET | Freq: Three times a day (TID) | ORAL | Status: AC | PRN
  Administered 2022-07-03 (×2): 5 mg via ORAL
  Filled 2022-07-03 (×2): qty 1

## 2022-07-03 NOTE — TOC Progression Note (Signed)
Transition of Care [facility]) - Progression Note    Patient Details  Name: [name] MRN: [id number] Date of Birth: May 16, 1945  Transition of Care [facility] CM/SW Contact  Rodney Booze, LCSW Phone Number: 07/03/2022, 12:12 PM  Clinical Narrative:    CSW has sent patient out.        Expected Discharge Plan and Services                                               Social Determinants of Health (SDOH) Interventions SDOH Screenings   Food Insecurity: No Food Insecurity (04/19/2019)  Housing: Low Risk  (04/19/2019)  Transportation Needs: No Transportation Needs (04/19/2019)  Alcohol Screen: Low Risk  (06/04/2018)  Depression (PHQ2-9): Low Risk  (08/10/2021)  Financial Resource Strain: Low Risk  (05/29/2017)  Physical Activity: Inactive (05/29/2017)  Social Connections: Moderately Integrated (05/29/2017)  Stress: No Stress Concern Present (05/29/2017)  Tobacco Use: Low Risk  (07/01/2022)    Readmission Risk Interventions     No data to display

## 2022-07-03 NOTE — Progress Notes (Signed)
PROGRESS NOTE  [name] [id number] DOB: [birth year]   PCP: [name], MD  Patient is from: SNF  DOA: 06/30/2022 LOS: 2  Brief Narrative: 77 year old F with PMH of NASH cirrhosis s/p TIPs, diastolic CHF, chronic edema, DM-2, HLD, anxiety and lumbar fusion presenting with sudden onset dizziness (vertigo) and nausea, and admitted for vertigo earlier this morning.  Workup in ED including MRI brain unrevealing.  BNP elevated to 630 (slightly higher than baseline).  UA without significant finding.  Patient was admitted for vestibular PT evaluation.  Started on IV Lasix for BLE edema. On further interview, patient states she was started on low-dose oxycodone recently.    Subjective: Patient mentions that the pain in the left leg is better today compared to yesterday.  No chest pain or shortness of breath.  Otherwise she feels well.  No further episodes of vertigo.  However she does feel weaker than usual and is agreeable to short-term rehab.    Objective: Vitals:   07/02/22 1532 07/02/22 1900 07/02/22 2000 07/03/22 0500  BP: (!) 121/55 (!) 131/52    Pulse: 79 88 88   Resp: 18 (!) 25 (!) 22   Temp: 99.3 F (37.4 C) 98.4 F (36.9 C)    TempSrc: Oral Oral    SpO2: (!) 20% 94% 93%   Weight:    64 kg  Height:        Examination:  General appearance: Awake alert.  In no distress Resp: Clear to auscultation bilaterally.  Normal effort Cardio: S1-S2 is normal regular.  No S3-S4.  No rubs murmurs or bruit GI: Abdomen is soft.  Nontender nondistended.  Bowel sounds are present normal.  No masses organomegaly Extremities: Edema noted left lower extremity.  Improvement in erythema noted today compared to yesterday. Neurologic: Alert and oriented x3.  No focal neurological deficits.     Assessment and plan:  BPPV MRI brain without acute finding.  She states she was recently started on low-dose oxycodone.  No recent URI symptoms.  No prior history of vertigo.  Appears  to be better.  Reevaluated by physical therapy who is now recommending short-term rehab.  Agree with same.  Will consult TOC.  At baseline patient mentions that she lives at home with her husband.  Left lower extremity cellulitis Complaining of pain in the left leg.  Underwent Doppler studies which did not show any DVT.  She was noted to have erythema in the posterior aspect of the left leg.  Findings consistent with cellulitis.  Patient was started on cefazolin.  WBC was elevated yesterday and noted to be improved today.  Continue cefazolin for another 24 hours.  Leg appears to be improving.   Acute on chronic diastolic CHF TTE in [id number] with LVEF of 60 to 65%, G2-DD and normal RVSP.   She was noted to have significant lower extremity edema.  Treated with IV furosemide.  Edema seems to be improving.  Continue with oral furosemide for now.  See above regarding cellulitis.   Paroxysmal SVT noted on telemetry.  Correct electrolytes.  Check magnesium level.  If there is recurrence then low-dose beta-blocker can be initiated.    NASH Cirrhosis s/p TIPS/Hypoalbuminemia No signs of SBP.  No encephalopathy. -Diuretics as above. -Continue home rifaximin and lactulose.  Right hip pain/right calf pain/BLE edema No DVT noted on Doppler study.  Pain is likely due to cellulitis.  Uncontrolled IDDM-2 with hyperglycemia A1c 9.2% in 03/2022.  On Tresiba 25 units at night, SSI,  [place] Experiencing some fluctuation in glucose levels.  Glargine dose has been adjusted.  Continue to monitor for now.    History of CAD/CABG x 3 -Continue home meds.  Elevated troponin Mild.  Likely demand ischemia.  Hypokalemia Will be repleted.  Recheck labs tomorrow.  Check magnesium.  Anxiety/insomnia: -Continue home meds.  Physical deconditioning/generalized weakness -PT/OT  DVT prophylaxis: Lovenox Code Status: Full code Family Communication: Discussed with patient.  No family at bedside. Disposition:  Hopefully return home when improved.  Consultants:  None   Sch Meds:  Scheduled Meds:  aspirin EC  81 mg Oral Daily   empagliflozin  25 mg Oral Daily   enoxaparin (LOVENOX) injection  40 mg Subcutaneous Q24H   ezetimibe  10 mg Oral Daily   furosemide  40 mg Oral Daily   insulin aspart  0-15 Units Subcutaneous TID WC   insulin detemir  12 Units Subcutaneous QHS   lactulose  30 g Oral Daily   metoprolol tartrate  12.5 mg Oral BID   pantoprazole  40 mg Oral Daily   rifaximin  550 mg Oral BID   traZODone  25 mg Oral QHS   Continuous Infusions:   ceFAZolin (ANCEF) IV 2 g (07/03/22 0606)   PRN Meds:.acetaminophen **OR** acetaminophen, LORazepam, meclizine, melatonin, ondansetron (ZOFRAN) IV  Antimicrobials: Anti-infectives (From admission, onward)    Start     Dose/Rate Route Frequency Ordered Stop   07/02/22 1400  ceFAZolin (ANCEF) IVPB 2g/100 mL premix        2 g 200 mL/hr over 30 Minutes Intravenous Every 8 hours 07/02/22 1108     07/01/22 1000  rifaximin (XIFAXAN) tablet 550 mg        550 mg Oral 2 times daily 07/01/22 0126          CBC: Recent Labs  Lab 06/30/22 1715 07/01/22 0143 07/03/22 0323  WBC 11.9* 13.5* 7.7  NEUTROABS 10.5* 11.4*  --   HGB 11.6* 12.1 10.6*  HCT 34.6* 37.8 31.5*  MCV 93.5 96.2 92.1  PLT 166 197 150    BMP &GFR Recent Labs  Lab 06/30/22 1715 07/01/22 0143 07/03/22 0323  NA 136 136 136  K 3.6 4.0 3.3*  CL 105 105 106  CO2 21* 20* 23  GLUCOSE 178* 152* 134*  BUN '10 13 11  '$ CREATININE 0.74 0.80 0.72  CALCIUM 8.4* 8.4* 7.8*  MG  --  1.9  --     Estimated Creatinine Clearance: 54.7 mL/min (by C-G formula based on SCr of 0.72 mg/dL). Liver & Pancreas: Recent Labs  Lab 06/30/22 1715 07/01/22 0143  AST 61* 53*  ALT 33 30  ALKPHOS 125 116  BILITOT 2.2* 2.6*  PROT 6.7 6.6  ALBUMIN 2.0* 1.9*     Recent Labs  Lab 06/30/22 1715  AMMONIA 24    Diabetic:  Recent Labs  Lab 07/02/22 0555 07/02/22 0733 07/02/22 1217  07/02/22 1535 07/03/22 0847  GLUCAP 74 157* 298* 197* 88     Coagulation Profile: Recent Labs  Lab 07/01/22 0143  INR 1.2     Urine analysis:    Component Value Date/Time   COLORURINE YELLOW 06/30/2022 2122   APPEARANCEUR CLEAR 06/30/2022 2122   LABSPEC 1.039 (H) 06/30/2022 2122   PHURINE 5.0 06/30/2022 2122   GLUCOSEU >=500 (A) 06/30/2022 2122   HGBUR MODERATE (A) 06/30/2022 2122   BILIRUBINUR NEGATIVE 06/30/2022 2122   BILIRUBINUR Negative 03/24/2022 1031   KETONESUR 5 (A) 06/30/2022 2122   PROTEINUR NEGATIVE 06/30/2022 2122  UROBILINOGEN 0.2 03/24/2022 1031   UROBILINOGEN 0.2 12/18/2011 1409   NITRITE NEGATIVE 06/30/2022 2122   LEUKOCYTESUR NEGATIVE 06/30/2022 2122    Microbiology: No results found for this or any previous visit (from the past 240 hour(s)).  Radiology Studies: No results found.    [place]  [facility]  If 7PM-7AM, please contact night-coverage [email or web address] 07/03/2022, 9:08 AM

## 2022-07-03 NOTE — Progress Notes (Signed)
Pt had 10 beat run of SVT. EKG done per Oncall MD(Hall). VSS and HR in the 80s.

## 2022-07-03 NOTE — TOC Initial Note (Signed)
Transition of Care [facility]) - Initial/Assessment Note    Patient Details  Name: [name] MRN: [id number] Date of Birth: 1945-11-05  Transition of [facility]) CM/SW Contact:    Rodney Booze, LCSW Phone Number: 07/03/2022, 12:16 PM  Clinical Narrative:                 CSW spoke to the husband, CSW explained the SNF process at this time the husband was unsure about the patient going to a SNF. This CSW has still sent out the FL2. TOC will continue to follow to see what the patients wishes are for DC.   Expected Discharge Plan: Skilled Nursing Facility Barriers to Discharge: No Barriers Identified   Patient Goals and CMS Choice Patient states their goals for this hospitalization and ongoing recovery are:: The patient is going to a SNF however the husband is/ may considering home heath. CMS Medicare.gov Compare Post Acute Care list provided to:: Patient        Expected Discharge Plan and Services                                              Prior Living Arrangements/Services   Lives with:: Spouse Patient language and need for interpreter reviewed:: No Do you feel safe going back to the place where you live?: Yes      Need for Family Participation in Patient Care: No (Comment) Care giver support system in place?: Yes (comment)   Criminal Activity/Legal Involvement Pertinent to Current Situation/Hospitalization: No - Comment as needed  Activities of Daily Living      Permission Sought/Granted                  Emotional Assessment Appearance:: Appears stated age     Orientation: : Oriented to Self, Oriented to Place, Oriented to  Time, Oriented to Situation      Admission diagnosis:  Vertigo [R42] Acute on chronic diastolic CHF (congestive heart failure) (HCC) [I50.33] Patient Active Problem List   Diagnosis Date Noted   Vertigo 07/01/2022   Acute on chronic diastolic CHF (congestive heart failure) ([place]) 07/01/2022   GAD (generalized  anxiety disorder) 07/01/2022   Bilateral lower extremity edema 07/01/2022   Tenderness of left calf 07/01/2022   Left hip pain 07/01/2022   Hypoalbuminemia 07/01/2022   S/P lumbar fusion 02/25/2022   Pyohydronephrosis 05/29/2021   URI (upper respiratory infection) 05/12/2021   Lymphopenia 11/05/2020   CHF (congestive heart failure) ([place])    Coronary artery disease    Type II diabetes mellitus ([place])    Type 2 diabetes mellitus without complication ([place])    [id number] virus infection 04/2020   A-fib ([place]) 08/19/2019   S/P TIPS (transjugular intrahepatic portosystemic shunt)    Hypercalcemia 05/26/2019   AKI (acute kidney injury) ([place])    Hyponatremia    Acute lower UTI 05/16/2019   Acute delirium    General weakness 05/15/2019   Encephalopathy, hepatic ([place]) 05/15/2019   Volume depletion 05/15/2019   Bleeding esophageal varices ([place])    UGI bleed    Infarction of spleen 04/06/2019   Odynophagia 04/01/2019   Dysphagia    Hepatoma ([place]) 01/31/2019   Coronary artery disease of native artery of native heart with stable angina pectoris ([place]) 01/31/2019   Thrombocytopenia ([place]) 09/25/2017   Left forearm pain 05/29/2017   Portal vein thrombosis 04/22/2017  Skin cancer of nose 08/23/2016   History of colonic polyps    Benign neoplasm of rectum    Esophageal varices without bleeding (HCC)    Portal hypertensive gastropathy (HCC)    Leg cramps 05/18/2016   Routine lab draw 05/13/2016   Iron deficiency anemia 05/09/2016   Statin-induced rhabdomyolysis 05/07/2016   Liver cirrhosis secondary to NASH ([place]) 04/05/2016   PVC's (premature ventricular contractions) 04/05/2016   CAD (coronary artery disease) 02/16/2016   S/P CABG x 3 02/02/2016   History of non-ST elevation myocardial infarction (NSTEMI) 01/27/2016   DM2 (diabetes mellitus, type 2) ([place]) 12/15/2015   Trochanteric bursitis of left hip 12/15/2015   S/P lumbar spinal fusion 06/10/2015   Paresthesia 04/01/2015    Osteoporosis 03/31/2015   Hearing loss 06/25/2014   Insomnia 06/25/2014   Hair thinning 06/25/2014   Muscle spasm 10/08/2013   Biceps tendon rupture, proximal 11/24/2012   Hyperlipidemia    GERD (gastroesophageal reflux disease)    Essential hypertension    PCP:  [name], MD Pharmacy:   [facility] [place], [address] [place] [address] Phone: [phone] Fax: [phone]     Social Determinants of Health (SDOH) Social History: SDOH Screenings   Food Insecurity: No Food Insecurity (04/19/2019)  Housing: Low Risk  (04/19/2019)  Transportation Needs: No Transportation Needs (04/19/2019)  Alcohol Screen: Low Risk  (06/04/2018)  Depression (PHQ2-9): Low Risk  (08/10/2021)  Financial Resource Strain: Low Risk  (05/29/2017)  Physical Activity: Inactive (05/29/2017)  Social Connections: Moderately Integrated (05/29/2017)  Stress: No Stress Concern Present (05/29/2017)  Tobacco Use: Low Risk  (07/01/2022)   SDOH Interventions:     Readmission Risk Interventions    07/03/2022   12:13 PM  Readmission Risk Prevention Plan  Transportation Screening Complete  Medication Review ([place]) Not Complete  PCP or Specialist appointment within 3-5 days of discharge Complete  SW Recovery Care/Counseling Consult Complete  Palliative Care Screening Not Complete  Comments patient will go to skilled [place] Complete

## 2022-07-03 NOTE — NC FL2 (Signed)
[place] LEVEL OF CARE FORM     IDENTIFICATION  Patient Name: [name] Birthdate: 05/14/1945 Sex: female Admission Date (Current Location): 06/30/2022  [facility] [place] Number:  Herbalist and Address:  [facility], [place] [address] [place], [place] [address]      Provider Number: [id number]  Attending Physician Name and Address:  Bonnielee Haff, MD  Relative Name and Phone Number:  Huband Mr. Sehr    Current Level of Care: Hospital Recommended Level of Care: [place] Prior Approval Number:    Date Approved/Denied: 07/03/22 PASRR Number: [id number]  Discharge Plan: SNF    Current Diagnoses: Patient Active Problem List   Diagnosis Date Noted   Vertigo 07/01/2022   Acute on chronic diastolic CHF (congestive heart failure) ([place]) 07/01/2022   GAD (generalized anxiety disorder) 07/01/2022   Bilateral lower extremity edema 07/01/2022   Tenderness of left calf 07/01/2022   Left hip pain 07/01/2022   Hypoalbuminemia 07/01/2022   S/P lumbar fusion 02/25/2022   Pyohydronephrosis 05/29/2021   URI (upper respiratory infection) 05/12/2021   Lymphopenia 11/05/2020   CHF (congestive heart failure) (HCC)    Coronary artery disease    Type II diabetes mellitus ([place])    Type 2 diabetes mellitus without complication ([place])    [id number] virus infection 04/2020   A-fib ([place]) 08/19/2019   S/P TIPS (transjugular intrahepatic portosystemic shunt)    Hypercalcemia 05/26/2019   AKI (acute kidney injury) ([place])    Hyponatremia    Acute lower UTI 05/16/2019   Acute delirium    General weakness 05/15/2019   Encephalopathy, hepatic ([place]) 05/15/2019   Volume depletion 05/15/2019   Bleeding esophageal varices (HCC)    UGI bleed    Infarction of spleen 04/06/2019   Odynophagia 04/01/2019   Dysphagia    Hepatoma ([place]) 01/31/2019   Coronary artery disease of native artery of native heart with stable angina  pectoris ([place]) 01/31/2019   Thrombocytopenia ([place]) 09/25/2017   Left forearm pain 05/29/2017   Portal vein thrombosis 04/22/2017   Skin cancer of nose 08/23/2016   History of colonic polyps    Benign neoplasm of rectum    Esophageal varices without bleeding (HCC)    Portal hypertensive gastropathy (HCC)    Leg cramps 05/18/2016   Routine lab draw 05/13/2016   Iron deficiency anemia 05/09/2016   Statin-induced rhabdomyolysis 05/07/2016   Liver cirrhosis secondary to NASH ([place]) 04/05/2016   PVC's (premature ventricular contractions) 04/05/2016   CAD (coronary artery disease) 02/16/2016   S/P CABG x 3 02/02/2016   History of non-ST elevation myocardial infarction (NSTEMI) 01/27/2016   DM2 (diabetes mellitus, type 2) ([place]) 12/15/2015   Trochanteric bursitis of left hip 12/15/2015   S/P lumbar spinal fusion 06/10/2015   Paresthesia 04/01/2015   Osteoporosis 03/31/2015   Hearing loss 06/25/2014   Insomnia 06/25/2014   Hair thinning 06/25/2014   Muscle spasm 10/08/2013   Biceps tendon rupture, proximal 11/24/2012   Hyperlipidemia    GERD (gastroesophageal reflux disease)    Essential hypertension     Orientation RESPIRATION BLADDER Height & Weight     Self, Time, Situation, Place  Normal Continent Weight: 141 lb 1.5 oz (64 kg) Height:  '5\' 3"'$  (160 cm)  BEHAVIORAL SYMPTOMS/MOOD NEUROLOGICAL BOWEL NUTRITION STATUS      Continent    AMBULATORY STATUS COMMUNICATION OF NEEDS Skin   Limited Assist Verbally Normal  Personal Care Assistance Level of Assistance  Bathing, Feeding, Dressing Bathing Assistance: Limited assistance Feeding assistance: Independent Dressing Assistance: Limited assistance     Functional Limitations Info  Sight, Hearing, Speech Sight Info: Adequate Hearing Info: Adequate Speech Info: Adequate    SPECIAL CARE FACTORS FREQUENCY                       Contractures Contractures Info: Not present    Additional Factors  Info  Code Status, Allergies Code Status Info: Full Allergies Info: Kiwi Extract High Allergy Anaphylaxis  Gabapentin Medium  Nausea And Vomiting  Tdap (tetanus-diphth-acell Pertussis) Medium Allergy Swelling, Other (See Comments) Swelling at injection site, gets very hot Latex Low Allergy Itching, Dermatitis, Rash  Statins Low Contraindication Other (See Comments) RHABDOMYOLYSIS Tramadol Low Intolerance Nausea And Vomiting           Current Medications (07/03/2022):  This is the current hospital active medication list Current Facility-Administered Medications  Medication Dose Route Frequency Provider Last Rate Last Admin   acetaminophen (TYLENOL) tablet 650 mg  650 mg Oral Q6H PRN Howerter, Justin B, DO   650 mg at 07/03/22 [id number]   Or   acetaminophen (TYLENOL) suppository 650 mg  650 mg Rectal Q6H PRN Howerter, Justin B, DO       aspirin EC tablet 81 mg  81 mg Oral Daily Wendee Beavers T, MD   81 mg at 07/03/22 0849   ceFAZolin (ANCEF) IVPB 2g/100 mL premix  2 g Intravenous [id number] [name], RPH   Stopped at 07/03/22 0945   empagliflozin (JARDIANCE) tablet 25 mg  25 mg Oral Daily Wendee Beavers T, MD   25 mg at 07/03/22 0856   enoxaparin (LOVENOX) injection 40 mg  40 mg Subcutaneous Q24H Wendee Beavers T, MD   40 mg at 07/02/22 1322   ezetimibe (ZETIA) tablet 10 mg  10 mg Oral Daily Howerter, Justin B, DO   10 mg at 07/03/22 0849   furosemide (LASIX) tablet 40 mg  40 mg Oral Daily Wendee Beavers T, MD   40 mg at 07/03/22 0849   insulin aspart (novoLOG) injection 0-15 Units  0-15 Units Subcutaneous TID WC Wendee Beavers T, MD   3 Units at 07/02/22 1618   insulin detemir (LEVEMIR) injection 12 Units  12 Units Subcutaneous QHS Bonnielee Haff, MD   12 Units at 07/02/22 2153   lactulose (CHRONULAC) 10 GM/15ML solution 30 g  30 g Oral Daily Howerter, Justin B, DO   30 g at 07/03/22 0849   LORazepam (ATIVAN) tablet 0.5 mg  0.5 mg Oral QHS PRN Howerter, Justin B, DO       meclizine (ANTIVERT) tablet 25 mg   25 mg Oral TID PRN Howerter, Justin B, DO       melatonin tablet 3 mg  3 mg Oral QHS PRN Howerter, Justin B, DO       ondansetron (ZOFRAN) injection 4 mg  4 mg Intravenous Q6H PRN Howerter, Justin B, DO       oxyCODONE (Oxy IR/ROXICODONE) immediate release tablet 5 mg  5 mg Oral Q8H PRN Bonnielee Haff, MD       pantoprazole (PROTONIX) EC tablet 40 mg  40 mg Oral Daily Howerter, Justin B, DO   40 mg at 07/03/22 0849   potassium chloride SA (KLOR-CON M) CR tablet 40 mEq  40 mEq Oral TID Bonnielee Haff, MD       rifaximin Doreene Nest) tablet 550 mg  550 mg Oral BID  Howerter, Justin B, DO   550 mg at 07/03/22 0849   traZODone (DESYREL) tablet 25 mg  25 mg Oral QHS Mercy Riding, MD   25 mg at 07/02/22 2151     Discharge Medications: Please see discharge summary for a list of discharge medications.  Relevant Imaging Results:  Relevant Lab Results:   Additional Information [place] Aydan Levitz LCSW-A SSI# [id number]  Rodney Booze, [place]

## 2022-07-04 DIAGNOSIS — I4891 Unspecified atrial fibrillation: Secondary | ICD-10-CM | POA: Diagnosis present

## 2022-07-04 DIAGNOSIS — M810 Age-related osteoporosis without current pathological fracture: Secondary | ICD-10-CM | POA: Diagnosis present

## 2022-07-04 DIAGNOSIS — G4700 Insomnia, unspecified: Secondary | ICD-10-CM | POA: Diagnosis present

## 2022-07-04 DIAGNOSIS — R42 Dizziness and giddiness: Secondary | ICD-10-CM | POA: Diagnosis not present

## 2022-07-04 DIAGNOSIS — I10 Essential (primary) hypertension: Secondary | ICD-10-CM | POA: Diagnosis present

## 2022-07-04 DIAGNOSIS — I2510 Atherosclerotic heart disease of native coronary artery without angina pectoris: Secondary | ICD-10-CM | POA: Diagnosis present

## 2022-07-04 DIAGNOSIS — E1169 Type 2 diabetes mellitus with other specified complication: Secondary | ICD-10-CM | POA: Diagnosis not present

## 2022-07-04 DIAGNOSIS — M6281 Muscle weakness (generalized): Secondary | ICD-10-CM | POA: Diagnosis present

## 2022-07-04 DIAGNOSIS — E785 Hyperlipidemia, unspecified: Secondary | ICD-10-CM | POA: Diagnosis present

## 2022-07-04 DIAGNOSIS — K219 Gastro-esophageal reflux disease without esophagitis: Secondary | ICD-10-CM | POA: Diagnosis present

## 2022-07-04 DIAGNOSIS — R29898 Other symptoms and signs involving the musculoskeletal system: Secondary | ICD-10-CM | POA: Diagnosis not present

## 2022-07-04 DIAGNOSIS — Z741 Need for assistance with personal care: Secondary | ICD-10-CM | POA: Diagnosis present

## 2022-07-04 DIAGNOSIS — Z7401 Bed confinement status: Secondary | ICD-10-CM | POA: Diagnosis not present

## 2022-07-04 DIAGNOSIS — I5033 Acute on chronic diastolic (congestive) heart failure: Secondary | ICD-10-CM | POA: Diagnosis present

## 2022-07-04 DIAGNOSIS — E119 Type 2 diabetes mellitus without complications: Secondary | ICD-10-CM | POA: Diagnosis present

## 2022-07-04 DIAGNOSIS — R531 Weakness: Secondary | ICD-10-CM | POA: Diagnosis not present

## 2022-07-04 DIAGNOSIS — I480 Paroxysmal atrial fibrillation: Secondary | ICD-10-CM | POA: Diagnosis not present

## 2022-07-04 DIAGNOSIS — F419 Anxiety disorder, unspecified: Secondary | ICD-10-CM | POA: Diagnosis present

## 2022-07-04 DIAGNOSIS — L03116 Cellulitis of left lower limb: Secondary | ICD-10-CM | POA: Diagnosis present

## 2022-07-04 DIAGNOSIS — K7460 Unspecified cirrhosis of liver: Secondary | ICD-10-CM | POA: Diagnosis present

## 2022-07-04 DIAGNOSIS — K7581 Nonalcoholic steatohepatitis (NASH): Secondary | ICD-10-CM | POA: Diagnosis present

## 2022-07-04 LAB — CBC
HCT: 35.6 % — ABNORMAL LOW (ref 36.0–46.0)
Hemoglobin: 11.9 g/dL — ABNORMAL LOW (ref 12.0–15.0)
MCH: 31 pg (ref 26.0–34.0)
MCHC: 33.4 g/dL (ref 30.0–36.0)
MCV: 92.7 fL (ref 80.0–100.0)
Platelets: 190 10*3/uL (ref 150–400)
RBC: 3.84 MIL/uL — ABNORMAL LOW (ref 3.87–5.11)
RDW: 17.2 % — ABNORMAL HIGH (ref 11.5–15.5)
WBC: 9.7 10*3/uL (ref 4.0–10.5)
nRBC: 0 % (ref 0.0–0.2)

## 2022-07-04 LAB — BASIC METABOLIC PANEL
Anion gap: 8 (ref 5–15)
BUN: 11 mg/dL (ref 8–23)
CO2: 21 mmol/L — ABNORMAL LOW (ref 22–32)
Calcium: 7.8 mg/dL — ABNORMAL LOW (ref 8.9–10.3)
Chloride: 105 mmol/L (ref 98–111)
Creatinine, Ser: 0.72 mg/dL (ref 0.44–1.00)
GFR, Estimated: >60 mL/min (ref >60)
Glucose, Bld: 191 mg/dL — ABNORMAL HIGH (ref 70–99)
Potassium: 4.2 mmol/L (ref 3.5–5.1)
Sodium: 134 mmol/L — ABNORMAL LOW (ref 135–145)

## 2022-07-04 LAB — GLUCOSE, CAPILLARY
Glucose-Capillary: 124 mg/dL — ABNORMAL HIGH (ref 70–99)
Glucose-Capillary: 230 mg/dL — ABNORMAL HIGH (ref 70–99)

## 2022-07-04 LAB — MAGNESIUM: Magnesium: 1.9 mg/dL (ref 1.7–2.4)

## 2022-07-04 MED ORDER — INSULIN LISPRO 100 UNIT/ML IJ SOLN: 0.0000 [IU] | INTRAMUSCULAR | 0 refills | Status: DC

## 2022-07-04 MED ORDER — MECLIZINE HCL 25 MG PO TABS: 25.0000 mg | ORAL_TABLET | Freq: Three times a day (TID) | ORAL | 0 refills | Status: DC | PRN

## 2022-07-04 MED ORDER — OXYCODONE HCL 5 MG PO TABS
5.0000 mg | ORAL_TABLET | Freq: Three times a day (TID) | ORAL | Status: DC | PRN
  Administered 2022-07-04 (×2): 5 mg via ORAL
  Filled 2022-07-04 (×2): qty 1

## 2022-07-04 MED ORDER — FUROSEMIDE 40 MG PO TABS: 40.0000 mg | ORAL_TABLET | Freq: Once | ORAL | Status: DC

## 2022-07-04 MED ORDER — XIFAXAN 550 MG PO TABS: 550.0000 mg | ORAL_TABLET | Freq: Two times a day (BID) | ORAL | 3 refills | Status: DC

## 2022-07-04 MED ORDER — OXYCODONE HCL 5 MG PO TABS: 2.5000 mg | ORAL_TABLET | Freq: Four times a day (QID) | ORAL | 0 refills | Status: DC | PRN

## 2022-07-04 MED ORDER — TRESIBA FLEXTOUCH 100 UNIT/ML ~~LOC~~ SOPN: 18.0000 [IU] | PEN_INJECTOR | Freq: Every day | SUBCUTANEOUS | 0 refills | Status: DC

## 2022-07-04 MED ORDER — INSULIN DETEMIR 100 UNIT/ML ~~LOC~~ SOLN
14.0000 [IU] | Freq: Every day | SUBCUTANEOUS | Status: DC
  Filled 2022-07-04: qty 0.14

## 2022-07-04 MED ORDER — CEPHALEXIN 500 MG PO CAPS: 500.0000 mg | ORAL_CAPSULE | Freq: Three times a day (TID) | ORAL | 0 refills | Status: AC

## 2022-07-04 MED ORDER — MECLIZINE HCL 25 MG PO TABS
25.0000 mg | ORAL_TABLET | Freq: Three times a day (TID) | ORAL | Status: DC
  Administered 2022-07-04: 25 mg via ORAL
  Filled 2022-07-04 (×4): qty 1

## 2022-07-04 NOTE — Discharge Summary (Signed)
[name] [id number] DOB: 1945-11-08 DOA: 06/30/2022  PCP: [name], MD  Admit date: 06/30/2022  Discharge date: 07/04/2022  Admitted From: Home   Disposition:  SNF   Recommendations for Outpatient Follow-up:   Follow up with PCP in 1-2 weeks  PCP Please obtain BMP/CBC, 2 view CXR in 1week,  (see Discharge instructions)   PCP Please follow up on the following pending results:     Home Health: None   Equipment/Devices: None  Consultations: None  Discharge Condition: Stable    CODE STATUS: Full    Diet Recommendation: Heart Healthy Low Carb    Chief Complaint  Patient presents with   Dizziness     Brief history of present illness from the day of admission and additional interim summary    77 year old F with PMH of NASH cirrhosis s/p TIPs, diastolic CHF, chronic edema, DM-2, HLD, anxiety and lumbar fusion presenting with sudden onset dizziness (vertigo) and nausea, and admitted for vertigo earlier this morning. Workup in ED including MRI brain unrevealing. BNP elevated to 630 (slightly higher than baseline). UA without significant finding. Patient was admitted for vestibular PT evaluation. Started on IV Lasix for BLE edema. On further interview, patient states she was started on low-dose oxycodone recently.                                                                     Hospital Course   BPPV MRI brain without acute finding.  She states she was recently started on low-dose oxycodone.  No recent URI symptoms.  No prior history of vertigo.  Appears to be better.  Placed on scheduled meclizine for a few days, advance activity. Reevaluated by physical therapy who is now recommending short-term rehab/ SNF, DC today if bed available.   Left lower extremity cellulitis Complaining of  pain in the left leg.  Underwent Doppler studies which did not show any DVT.  She was noted to have erythema in the posterior aspect of the left leg.  Findings consistent with cellulitis.  Patient was started on cefazolin.  WBC was elevated yesterday and noted to be improved today. Keflex x 5 days upon DC.  Cellulitis almost completely resolved, still has mild edema elevate with TED stockings.   Acute on chronic diastolic CHF TTE in [id number] with LVEF of 60 to 65%, G2-DD and normal RVSP.   She was noted to have significant lower extremity edema.  Treated with IV furosemide.  Edema seems to be improving.  Continue with oral furosemide for now.  See above regarding cellulitis.   Paroxysmal SVT noted on telemetry.  Correct electrolytes.  Check magnesium level.  If there is recurrence then low-dose beta-blocker can be initiated.     NASH Cirrhosis s/p TIPS/Hypoalbuminemia No signs of SBP.  No encephalopathy. -Diuretics as above. -Continue home rifaximin and lactulose.   Right hip pain/right calf pain/BLE edema No DVT noted on Doppler study.  Pain is likely due to cellulitis.   Uncontrolled IDDM-2 with hyperglycemia A1c 9.2% in 03/2022.  On Tresiba 25 units at night, SSI, Jardiance Experiencing some fluctuation in glucose levels.  Glargine dose has been adjusted.  Continue to monitor CBGs QAC-HS at SNF.[name] Kitchen     History of CAD/CABG x 3 -Continue home meds.   Elevated troponin Mild.  Likely demand ischemia.   Hypokalemia Will be repleted.  Recheck labs tomorrow.  Check magnesium.   Anxiety/insomnia: -Continue home meds.   Physical deconditioning/generalized weakness -PT/OT >> SNF    Discharge diagnosis     Principal Problem:   Vertigo Active Problems:   Hyperlipidemia   GERD (gastroesophageal reflux disease)   DM2 (diabetes mellitus, type 2) (HCC)   Liver cirrhosis secondary to NASH (HCC)   General weakness   Acute on chronic diastolic CHF (congestive heart failure) (HCC)   GAD  (generalized anxiety disorder)   Bilateral lower extremity edema   Tenderness of left calf   Left hip pain   Hypoalbuminemia    Discharge instructions    Discharge Instructions     Discharge instructions   Complete by: As directed    Follow with Primary MD [name], MD in 7 days   Get CBC, CMP, 2 view Chest X ray -  checked next visit with your primary MD    Activity: As tolerated with Full fall precautions use walker/cane & assistance as needed  Disposition Home   Diet: Heart Healthy Low Carb, check CBGs QAC-HS  Special Instructions: If you have smoked or chewed Tobacco  in the last 2 yrs please stop smoking, stop any regular Alcohol  and or any Recreational drug use.  On your next visit with your primary care physician please Get Medicines reviewed and adjusted.  Please request your Prim.MD to go over all Hospital Tests and Procedure/Radiological results at the follow up, please get all Hospital records sent to your Prim MD by signing hospital release before you go home.  If you experience worsening of your admission symptoms, develop shortness of breath, life threatening emergency, suicidal or homicidal thoughts you must seek medical attention immediately by calling 911 or calling your MD immediately  if symptoms less severe.  You Must read complete instructions/literature along with all the possible adverse reactions/side effects for all the Medicines you take and that have been prescribed to you. Take any new Medicines after you have completely understood and accpet all the possible adverse reactions/side effects.   Increase activity slowly   Complete by: As directed        Discharge Medications   Allergies as of 07/04/2022       Reactions   Kiwi Extract Anaphylaxis   Gabapentin Nausea And Vomiting   Tdap [tetanus-diphth-acell Pertussis] Swelling, Other (See Comments)   Swelling at injection site, gets very hot   Latex Itching, Dermatitis, Rash   Statins  Other (See Comments)   RHABDOMYOLYSIS   Tramadol Nausea And Vomiting        Medication List     STOP taking these medications    HYDROcodone-acetaminophen 5-325 MG tablet Commonly known as: Norco   LORazepam 0.5 MG tablet Commonly known as: ATIVAN   omeprazole 20 MG capsule Commonly known as: PRILOSEC       TAKE these medications    acetaminophen 500 MG tablet Commonly known  as: TYLENOL Take 500 mg by mouth every 6 (six) hours as needed for moderate pain.   aspirin EC 81 MG tablet Take 81 mg by mouth daily. Swallow whole.   B-D ULTRAFINE III SHORT PEN 31G X 8 MM Misc Generic drug: Insulin Pen Needle Use to give Insulin injections daily. Dx: E11.65   Biotin 10000 MCG Tabs Take 10 mg by mouth daily.   CALCIUM 600 + D PO Take 1 tablet by mouth in the morning and at bedtime.   cephALEXin 500 MG capsule Commonly known as: KEFLEX Take 1 capsule (500 mg total) by mouth 3 (three) times daily for 5 days.   empagliflozin 25 MG Tabs tablet Commonly known as: Jardiance Take 1 tablet (25 mg total) by mouth daily.   ezetimibe 10 MG tablet Commonly known as: ZETIA Take 1 tablet (10 mg total) by mouth daily.   fish oil-omega-3 fatty acids 1000 MG capsule Take 1 g by mouth daily.   fluticasone 50 MCG/ACT nasal spray Commonly known as: FLONASE Place 1 spray into both nostrils daily as needed for allergies or rhinitis.   furosemide 20 MG tablet Commonly known as: LASIX TAKE 1 TABLET(20 MG) BY MOUTH DAILY What changed: See the new instructions.   glucose blood test strip One Touch Ultra Blue test strips. Use to test blood sugar three times daily. Dx: E11.65   insulin lispro 100 UNIT/ML injection Commonly known as: HUMALOG Inject 0-0.12 mLs (0-12 Units total) into the skin See admin instructions. Before each meal 3 times a day, 140-199 - 2 units, 200-250 - 4 units, 251-299 - 6 units,  300-349 - 8 units,  350 or above 10 units. What changed: additional  instructions   INSULIN SYRINGE 1CC/31GX5/16" 31G X 5/16" 1 ML Misc USE AS DIRECTED TO INJECT LEVIMIR   LACTOBACILLUS PO Take 1 capsule by mouth daily.   lactulose 10 GM/15ML solution Commonly known as: CHRONULAC TAKE 30 ML BY MOUTH EVERY DAY What changed: See the new instructions.   MAGNESIUM PO Take 400 mg by mouth daily.   meclizine 25 MG tablet Commonly known as: ANTIVERT Take 1 tablet (25 mg total) by mouth 3 (three) times daily as needed for dizziness.   melatonin 3 MG Tabs tablet Take 3 mg by mouth at bedtime.   NUTRITIONAL DRINK PO Take 120 mLs by mouth daily. Medpass/House supplement   ondansetron 4 MG/2ML Soln injection Commonly known as: ZOFRAN Inject 4 mg into the vein once.   oxyCODONE 5 MG immediate release tablet Commonly known as: Oxy IR/ROXICODONE Take 0.5 tablets (2.5 mg total) by mouth every 6 (six) hours as needed for severe pain. What changed:  when to take this reasons to take this   pantoprazole 40 MG tablet Commonly known as: PROTONIX TAKE 1 TABLET(40 MG) BY MOUTH DAILY What changed:  how much to take how to take this when to take this additional instructions   potassium chloride 10 MEQ tablet Commonly known as: KLOR-CON M Take 20 mEq by mouth 2 (two) times daily.   rifaximin 550 MG Tabs tablet Commonly known as: XIFAXAN Take 550 mg by mouth 2 (two) times daily.   Xifaxan 550 MG Tabs tablet Generic drug: rifaximin Take 1 tablet (550 mg total) by mouth 2 (two) times daily.   SYSTANE OP Place 1 drop into both eyes at bedtime.   traZODone 50 MG tablet Commonly known as: DESYREL Take 25 mg by mouth at bedtime.   Tyler Aas FlexTouch 100 UNIT/ML FlexTouch Pen Generic drug:  insulin degludec Inject 18 Units into the skin at bedtime. What changed: how much to take   VITAMIN B 12 PO Take 1,000 mcg by mouth daily.   Vitamin D-3 25 MCG (1000 UT) Caps Take 1,000 Units by mouth daily.         Follow-up Information     [name], MD. Schedule an appointment as soon as possible for a visit in 1 week(s).   Specialties: Family Medicine, Emergency Medicine Contact information: [address] [place] [address] [phone]                 Major procedures and Radiology Reports - PLEASE review detailed and final reports thoroughly  -       VAS [place] LOWER EXTREMITY VENOUS (DVT)  Result Date: 07/01/2022  Lower Venous DVT Study Patient Name:  ELAENA SHARPLES [facility]  Date of Exam:   07/01/2022 Medical Rec #: [id number]               Accession #:    [id number] Date of Birth: 05/31/1945               Patient Gender: F Patient Age:   65 years Exam Location:  [facility] Procedure:      VAS [place] LOWER EXTREMITY VENOUS (DVT) Referring Phys: Bretta Bang GONFA --------------------------------------------------------------------------------  Indications: Left leg swelling.  Limitations: Poor ultrasound/tissue interface. Comparison Study: Previous exam on 06/02/21 was negative for DVT Performing Technologist: Rogelia Rohrer RVT, RDMS  Examination Guidelines: A complete evaluation includes B-mode imaging, spectral Doppler, color Doppler, and power Doppler as needed of all accessible portions of each vessel. Bilateral testing is considered an integral part of a complete examination. Limited examinations for reoccurring indications may be performed as noted. The reflux portion of the exam is performed with the patient in reverse Trendelenburg.  +---------+---------------+---------+-----------+----------+-------------------+ RIGHT    CompressibilityPhasicitySpontaneityPropertiesThrombus Aging      +---------+---------------+---------+-----------+----------+-------------------+ CFV      Full           No       Yes                                      +---------+---------------+---------+-----------+----------+-------------------+ SFJ      Full                                                              +---------+---------------+---------+-----------+----------+-------------------+ FV Prox  Full           No       Yes                                      +---------+---------------+---------+-----------+----------+-------------------+ FV Mid   Full           No       Yes                                      +---------+---------------+---------+-----------+----------+-------------------+ FV DistalFull           No  Yes                                      +---------+---------------+---------+-----------+----------+-------------------+ PFV      Full                                                             +---------+---------------+---------+-----------+----------+-------------------+ POP      Full           No       Yes                                      +---------+---------------+---------+-----------+----------+-------------------+ PTV      Full                                                             +---------+---------------+---------+-----------+----------+-------------------+ PERO                                                  Not well visualized +---------+---------------+---------+-----------+----------+-------------------+ pulsatile flow  +---------+---------------+---------+-----------+----------+--------------+ LEFT     CompressibilityPhasicitySpontaneityPropertiesThrombus Aging +---------+---------------+---------+-----------+----------+--------------+ CFV      Full           No       Yes                                 +---------+---------------+---------+-----------+----------+--------------+ SFJ      Full                                                        +---------+---------------+---------+-----------+----------+--------------+ FV Prox  Full           No       Yes                                 +---------+---------------+---------+-----------+----------+--------------+ FV Mid   Full           No        Yes                                 +---------+---------------+---------+-----------+----------+--------------+ FV DistalFull           No       Yes                                 +---------+---------------+---------+-----------+----------+--------------+ PFV      Full                                                        +---------+---------------+---------+-----------+----------+--------------+  POP      Full           No       Yes                                 +---------+---------------+---------+-----------+----------+--------------+ PTV      Full                                                        +---------+---------------+---------+-----------+----------+--------------+ PERO     Full                                                        +---------+---------------+---------+-----------+----------+--------------+ pulsatile flow    Summary: BILATERAL: - No evidence of deep vein thrombosis seen in the lower extremities, bilaterally. -No evidence of popliteal cyst, bilaterally. -Subcutaneous edema, bilaterally  *See table(s) above for measurements and observations. Electronically signed by Orlie Pollen on 07/01/2022 at 6:55:24 PM.    Final    DG HIP UNILAT WITH PELVIS 2-3 VIEWS LEFT  Result Date: 07/01/2022 CLINICAL DATA:  Left hip pain, recent fall in January EXAM: DG HIP (WITH OR WITHOUT PELVIS) 2-3V LEFT COMPARISON:  05/27/2022 FINDINGS: Bones are osteopenic. Fusion hardware noted of the visualized lower lumbar spine. Bony pelvis and hips appear symmetric and intact. No displaced hip fracture or malalignment. Nonobstructive bowel gas pattern. IMPRESSION: Osteopenia. No acute finding by plain radiography. Electronically Signed   By: Jerilynn Mages.  Shick M.D.   On: 07/01/2022 14:44   MR BRAIN WO CONTRAST  Result Date: 06/30/2022 CLINICAL DATA:  Horizontal nystagmus.  Dizziness and nausea. EXAM: MRI HEAD WITHOUT CONTRAST TECHNIQUE: Multiplanar, multiecho pulse sequences  of the brain and surrounding structures were obtained without intravenous contrast. COMPARISON:  CT Head 05/28/22 FINDINGS: Brain: No acute infarction, hemorrhage, hydrocephalus, extra-axial collection or mass lesion. Sequela of moderate chronic microvascular ischemic change. There is T2/FLAIR hyperintense signal in the subcortical white matter in the bilateral parietal lobes, which is also favored to represent sequela of chronic ischemic change. Vascular: Right A1 flow void is not visualized, possibly congenital Skull and upper cervical spine: Normal marrow signal. Sinuses/Orbits: Trace right mastoid effusion. Mild mucosal thickening bilateral ethmoid air cells. Other: None. IMPRESSION: No acute intracranial process. Electronically Signed   By: Marin Roberts M.D.   On: 06/30/2022 20:03    Micro Results     No results found for this or any previous visit (from the past 240 hour(s)).  Today   Subjective    [name] today has no headache,no chest abdominal pain,no new weakness tingling or numbness, feels much better wants to go home today.     Objective   Blood pressure (!) 157/78, pulse 89, temperature 98.3 F (36.8 C), temperature source Oral, resp. rate (!) 23, height '5\' 3"'$  (1.6 m), weight 64 kg, SpO2 94 %.   Intake/Output Summary (Last 24 hours) at 07/04/2022 1329 Last data filed at 07/03/2022 2115 Gross per 24 hour  Intake --  Output 400 ml  Net -400 ml    Exam  Awake Alert, No new F.N deficits,  [place].AT,PERRAL Supple Neck,   Symmetrical Chest wall movement, Good air movement bilaterally, CTAB RRR,No Gallops,   +ve B.Sounds, Abd Soft, Non tender,  1+ left leg edema    Data Review   Recent Labs  Lab 06/30/22 1715 07/01/22 0143 07/03/22 0323 07/04/22 0411  WBC 11.9* 13.5* 7.7 9.7  HGB 11.6* 12.1 10.6* 11.9*  HCT 34.6* 37.8 31.5* 35.6*  PLT 166 197 150 190  MCV 93.5 96.2 92.1 92.7  MCH 31.4 30.8 31.0 31.0  MCHC 33.5 32.0 33.7 33.4  RDW 17.1* 17.3* 17.0* 17.2*   LYMPHSABS 0.5* 1.1  --   --   MONOABS 0.9 0.8  --   --   EOSABS 0.0 0.1  --   --   BASOSABS 0.0 0.0  --   --     Recent Labs  Lab 06/30/22 1715 07/01/22 0143 07/03/22 0319 07/03/22 0323 07/04/22 0411  NA 136 136  --  136 134*  K 3.6 4.0  --  3.3* 4.2  CL 105 105  --  106 105  CO2 21* 20*  --  23 21*  ANIONGAP 10 11  --  7 8  GLUCOSE 178* 152*  --  134* 191*  BUN 10 13  --  11 11  CREATININE 0.74 0.80  --  0.72 0.72  AST 61* 53*  --   --   --   ALT 33 30  --   --   --   ALKPHOS 125 116  --   --   --   BILITOT 2.2* 2.6*  --   --   --   ALBUMIN 2.0* 1.9*  --   --   --   INR  --  1.2  --   --   --   AMMONIA 24  --   --   --   --   BNP 630.2*  --   --   --   --   MG  --  1.9 1.8  --  1.9  CALCIUM 8.4* 8.4*  --  7.8* 7.8*     Total Time in preparing paper work, data evaluation and todays exam - 35 minutes  Signature  -    Lala Lund M.D on 07/04/2022 at 1:29 PM   -  To page go to [email or web address]

## 2022-07-04 NOTE — TOC Progression Note (Addendum)
Transition of Care [facility]) - Progression Note    Patient Details  Name: [name] MRN: [id number] Date of Birth: 1945/05/10  Transition of Care [facility] CM/SW Tat Mom[place], [place] Phone Number: 07/04/2022, 12:38 PM  Clinical Narrative:     CSW met with pt and pt's spouse bedside and provided SNF bed offers. Pt and spouse are undecided on facility. Pt states she was at [facility] for about 3 weeks and mentioned having a fee there. CSW explained medicare coverage and that pt may be in copay days. Pt states her son is coming to the room soon and states he would assist with this decision.   1200: CSW met pt, spouse, son bedside. They would like for pt to go to [facility]. CSW updated son of potential copay days. Pt does have a supplemental [employer] plan that may cover copays. CSW to follow up with [facility] and [facility].   CSW confirmed with [facility] that pt used 22 medicare days at [facility] and is in copay days.   CSW contacted [facility] to inquire if they can accept pt in copay days and use supplemental plan; they will review and follow up.   1300: [facility] confirmed they can accept pt today and pts supplemental [employer] plan could be used on copays. CSW updated pt and pts spouse on this and plan for DC today.   CSW left voicemail with pt's son notifying of DC today.     Expected Discharge Plan: Skilled Nursing Facility Barriers to Discharge: SNF Pending bed offer  Expected Discharge Plan and Services                                               Social Determinants of Health (SDOH) Interventions SDOH Screenings   Food Insecurity: No Food Insecurity (04/19/2019)  Housing: Low Risk  (04/19/2019)  Transportation Needs: No Transportation Needs (04/19/2019)  Alcohol Screen: Low Risk  (06/04/2018)  Depression (PHQ2-9): Low Risk  (08/10/2021)  Financial Resource Strain: Low Risk  (05/29/2017)  Physical Activity: Inactive (05/29/2017)  Social Connections: Moderately  Integrated (05/29/2017)  Stress: No Stress Concern Present (05/29/2017)  Tobacco Use: Low Risk  (07/01/2022)    Readmission Risk Interventions    07/03/2022   12:13 PM  Readmission Risk Prevention Plan  Transportation Screening Complete  Medication Review ([place]) Not Complete  PCP or Specialist appointment within 3-5 days of discharge Complete  SW Recovery Care/Counseling Consult Complete  Palliative Care Screening Not Complete  Comments patient will go to skilled [facility] Complete

## 2022-07-04 NOTE — Discharge Instructions (Signed)
Follow with Primary MD [name], MD in 7 days   Get CBC, CMP, 2 view Chest X ray -  checked next visit with your primary MD    Activity: As tolerated with Full fall precautions use walker/cane & assistance as needed  Disposition Home   Diet: Heart Healthy Low Carb, check CBGs QAC-HS  Special Instructions: If you have smoked or chewed Tobacco  in the last 2 yrs please stop smoking, stop any regular Alcohol  and or any Recreational drug use.  On your next visit with your primary care physician please Get Medicines reviewed and adjusted.  Please request your Prim.MD to go over all Hospital Tests and Procedure/Radiological results at the follow up, please get all Hospital records sent to your Prim MD by signing hospital release before you go home.  If you experience worsening of your admission symptoms, develop shortness of breath, life threatening emergency, suicidal or homicidal thoughts you must seek medical attention immediately by calling 911 or calling your MD immediately  if symptoms less severe.  You Must read complete instructions/literature along with all the possible adverse reactions/side effects for all the Medicines you take and that have been prescribed to you. Take any new Medicines after you have completely understood and accpet all the possible adverse reactions/side effects.

## 2022-07-04 NOTE — Progress Notes (Signed)
PT Cancellation Note  Patient Details Name: [name] MRN: [id number] DOB: 1945-08-18   Cancelled Treatment:    Reason Eval/Treat Not Completed: Other (comment) (Pt would not work with PT as she cant get her tylenol and oxycodone until 1215.  Asked to have PT at 2 pm. PT will attempt as able in pm.)   Alvira Philips 07/04/2022, 12:35 PM Elnore Cosens M,PT Acute Rehab Services [phone]

## 2022-07-04 NOTE — TOC Transition Note (Signed)
Transition of Care [facility]) - CM/SW Discharge Note   Patient Details  Name: [name] MRN: [id number] Date of Birth: October 31, 1945  [facility] CM/SW Contact:  Bethann Berkshire, [place] Phone Number: 07/04/2022, 2:29 PM   Clinical Narrative:     Patient will DC to: [facility] [address] Anticipated DC date: 07/04/22 Family notified: Son [place], Abelino Derrick Transport by: Corey Harold   Per MD patient ready for DC to [facility]. RN, patient, patient's family, and facility notified of DC. Discharge Summary and FL2 sent to facility. RN to call report prior to discharge ([phone] ). DC packet on chart. Ambulance transport requested for patient.   CSW will sign off for now as social work intervention is no longer needed. Please consult [place] again if new needs arise.   Final next level of care: Skilled Nursing Facility Barriers to Discharge: No Barriers Identified   Patient Goals and CMS Choice CMS Medicare.gov Compare Post Acute Care list provided to:: Patient    Discharge Placement                Patient chooses bed at:  [facility]) Patient to be transferred to facility by: [place] Name of family member notified: Husband [name] and Jerene Canny Patient and family notified of of transfer: 07/04/22  Discharge Plan and Services Additional resources added to the After Visit Summary for                                       Social Determinants of Health (SDOH) Interventions SDOH Screenings   Food Insecurity: No Food Insecurity (04/19/2019)  Housing: Low Risk  (04/19/2019)  Transportation Needs: No Transportation Needs (04/19/2019)  Alcohol Screen: Low Risk  (06/04/2018)  Depression (PHQ2-9): Low Risk  (08/10/2021)  Financial Resource Strain: Low Risk  (05/29/2017)  Physical Activity: Inactive (05/29/2017)  Social Connections: Moderately Integrated (05/29/2017)  Stress: No Stress Concern Present (05/29/2017)  Tobacco Use: Low Risk  (07/01/2022)     Readmission Risk  Interventions    07/03/2022   12:13 PM  Readmission Risk Prevention Plan  Transportation Screening Complete  Medication Review ([place]) Not Complete  PCP or Specialist appointment within 3-5 days of discharge Complete  SW Recovery Care/Counseling Consult Complete  Palliative Care Screening Not Complete  Comments patient will go to skilled [place] Complete

## 2022-07-04 NOTE — Progress Notes (Signed)
PROGRESS NOTE  [name] [id number] DOB: 12-19-1945   PCP: [name], MD  Patient is from: SNF  DOA: 06/30/2022 LOS: 3  Brief Narrative: 77 year old F with PMH of NASH cirrhosis s/p TIPs, diastolic CHF, chronic edema, DM-2, HLD, anxiety and lumbar fusion presenting with sudden onset dizziness (vertigo) and nausea, and admitted for vertigo earlier this morning.  Workup in ED including MRI brain unrevealing.  BNP elevated to 630 (slightly higher than baseline).  UA without significant finding.  Patient was admitted for vestibular PT evaluation.  Started on IV Lasix for BLE edema. On further interview, patient states she was started on low-dose oxycodone recently.    Subjective:  Patient in bed, appears comfortable, denies any headache, no fever, no chest pain or pressure, no shortness of breath , no abdominal pain. No new focal weakness.   Objective: Vitals:   07/04/22 0400 07/04/22 0415 07/04/22 0600 07/04/22 0800  BP: (!) 147/71   (!) 139/98  Pulse: 82  75 78  Resp: 20  (!) 28 14  Temp:  98.9 F (37.2 C)  98.3 F (36.8 C)  TempSrc:  Oral  Oral  SpO2: 95%  93% 94%  Weight:      Height:        Examination:  Awake Alert, No new F.N deficits, Normal affect Bud.AT,PERRAL Supple Neck, No JVD,   Symmetrical Chest wall movement, Good air movement bilaterally, CTAB RRR,No Gallops, Rubs or new Murmurs,  +ve B.Sounds, Abd Soft, No tenderness,   1+ L.leg edema     Assessment and plan:  BPPV MRI brain without acute finding.  She states she was recently started on low-dose oxycodone.  No recent URI symptoms.  No prior history of vertigo.  Appears to be better.  Placed on scheduled meclizine for a few days, advance activity. Reevaluated by physical therapy who is now recommending short-term rehab.  Agree with same.  Will consult TOC.  At baseline patient mentions that she lives at home with her husband.  Left lower extremity cellulitis Complaining of pain in  the left leg.  Underwent Doppler studies which did not show any DVT.  She was noted to have erythema in the posterior aspect of the left leg.  Findings consistent with cellulitis.  Patient was started on cefazolin.  WBC was elevated yesterday and noted to be improved today.  Continue cefazolin for another 24 hours.  Cellulitis almost completely resolved, still has mild edema elevate with TED stockings.  Acute on chronic diastolic CHF TTE in [id number] with LVEF of 60 to 65%, G2-DD and normal RVSP.   She was noted to have significant lower extremity edema.  Treated with IV furosemide.  Edema seems to be improving.  Continue with oral furosemide for now.  See above regarding cellulitis.   Paroxysmal SVT noted on telemetry.  Correct electrolytes.  Check magnesium level.  If there is recurrence then low-dose beta-blocker can be initiated.    NASH Cirrhosis s/p TIPS/Hypoalbuminemia No signs of SBP.  No encephalopathy. -Diuretics as above. -Continue home rifaximin and lactulose.  Right hip pain/right calf pain/BLE edema No DVT noted on Doppler study.  Pain is likely due to cellulitis.  Uncontrolled IDDM-2 with hyperglycemia A1c 9.2% in 03/2022.  On Tresiba 25 units at night, SSI, Jardiance Experiencing some fluctuation in glucose levels.  Glargine dose has been adjusted.  Continue to monitor for now.    History of CAD/CABG x 3 -Continue home meds.  Elevated troponin Mild.  Likely demand ischemia.  Hypokalemia Will be repleted.  Recheck labs tomorrow.  Check magnesium.  Anxiety/insomnia: -Continue home meds.  Physical deconditioning/generalized weakness -PT/OT  DVT prophylaxis: Lovenox Code Status: Full code Family Communication: Discussed with patient.  No family at bedside. Disposition: Hopefully return home when improved.  Consultants:  None   Sch Meds:  Scheduled Meds:  aspirin EC  81 mg Oral Daily   empagliflozin  25 mg Oral Daily   enoxaparin (LOVENOX) injection  40 mg  Subcutaneous Q24H   ezetimibe  10 mg Oral Daily   furosemide  40 mg Oral Daily   insulin aspart  0-15 Units Subcutaneous TID WC   insulin detemir  14 Units Subcutaneous QHS   lactulose  30 g Oral Daily   meclizine  25 mg Oral TID   pantoprazole  40 mg Oral Daily   rifaximin  550 mg Oral BID   traZODone  25 mg Oral QHS   Continuous Infusions:   ceFAZolin (ANCEF) IV 2 g (07/04/22 [id number])   PRN Meds:  CBC:  Recent Labs  Lab 06/30/22 1715 07/01/22 0143 07/03/22 0323 07/04/22 0411  WBC 11.9* 13.5* 7.7 9.7  HGB 11.6* 12.1 10.6* 11.9*  HCT 34.6* 37.8 31.5* 35.6*  PLT 166 197 150 190  MCV 93.5 96.2 92.1 92.7  MCH 31.4 30.8 31.0 31.0  MCHC 33.5 32.0 33.7 33.4  RDW 17.1* 17.3* 17.0* 17.2*  LYMPHSABS 0.5* 1.1  --   --   MONOABS 0.9 0.8  --   --   EOSABS 0.0 0.1  --   --   BASOSABS 0.0 0.0  --   --     Recent Labs  Lab 06/30/22 1715 07/01/22 0143 07/03/22 0319 07/03/22 0323 07/04/22 0411  NA 136 136  --  136 134*  K 3.6 4.0  --  3.3* 4.2  CL 105 105  --  106 105  CO2 21* 20*  --  23 21*  ANIONGAP 10 11  --  7 8  GLUCOSE 178* 152*  --  134* 191*  BUN 10 13  --  11 11  CREATININE 0.74 0.80  --  0.72 0.72  AST 61* 53*  --   --   --   ALT 33 30  --   --   --   ALKPHOS 125 116  --   --   --   BILITOT 2.2* 2.6*  --   --   --   ALBUMIN 2.0* 1.9*  --   --   --   INR  --  1.2  --   --   --   AMMONIA 24  --   --   --   --   BNP 630.2*  --   --   --   --   MG  --  1.9 1.8  --  1.9  CALCIUM 8.4* 8.4*  --  7.8* 7.8*   Signature  -    Lala Lund M.D on 07/04/2022 at 10:01 AM   -  To page go to [email or web address]

## 2022-07-04 NOTE — Progress Notes (Signed)
Physical Therapy Treatment Patient Details Name: [name] MRN: [id number] DOB: November 10, 1945 Today's Date: 07/04/2022   History of Present Illness [name] is a 77 y.o. female admitted 2/22   after presenting from home [facility] ED complaining of vertigo. PMH: [name] cirrhosis, chronic diastolic heart failure, chronic edema of the right lower extremity, hyperlipidemia, type 2 diabetes mellitus, GAD    PT Comments    Pt continues at a heavy assist for transfers and Mod A for sit to stand. She has significant difficulty with progressing her RLE due to heavy WB on the R secondary to pain in the LLE. Pt has difficulty with wgt shifting when assisted and pivots the RLE rather than picking it up. Due to current level of function, home set up, available assistance at home and high risk for falls recommending skilled physical therapy services at higher level of care on discharge from acute care hospital setting in order to decrease risk for falls, injury and re-hospitalization. Pt HR/O2  sats remained WNL throughout session.    Recommendations for follow up therapy are one component of a multi-disciplinary discharge planning process, led by the attending physician.  Recommendations may be updated based on patient status, additional functional criteria and insurance authorization.  Follow Up Recommendations  Skilled nursing-short term rehab (<3 hours/day) Can patient physically be transported by private vehicle: No   Assistance Recommended at Discharge Frequent or constant Supervision/Assistance  Patient can return home with the following A lot of help with walking and/or transfers;Assistance with cooking/housework;Assist for transportation;Help with stairs or ramp for entrance   Equipment Recommendations  None recommended by PT    Recommendations for Other Services       Precautions / Restrictions Precautions Precautions: Fall Precaution Comments: L leg  infection Restrictions Weight Bearing Restrictions: No     Mobility  Bed Mobility Overal bed mobility: Needs Assistance Bed Mobility: Rolling, Sidelying to Sit, Sit to Supine Rolling: Min guard Sidelying to sit: Min guard   Sit to supine: Min assist   General bed mobility comments: Pt requires Min A to get her LLE into the bed for sitting to supine. Guarding due to instability secondary to genearlized weakness with rolling and supine to sitting. Patient Response: Cooperative  Transfers Overall transfer level: Needs assistance Equipment used: Rolling walker (2 wheels) Transfers: Sit to/from Stand, Bed to chair/wheelchair/BSC Sit to Stand: Mod assist   Step pivot transfers: Max assist       General transfer comment: Pt was Mod a for sit to stand from EOB to RW with assist for momentum to get to standing and extra time to pull pelvis forward due to LB/hip extensor weakness. Pt was Max A for transfer from EOB<>BSC due to significant difficutly progressing the RLE due to pt has difficulty WB on the LLE with alot of extra time and assistance into anterior position due to heavy posterior positioning with slight crouch.    Ambulation/Gait               General Gait Details: Unable to progress due to current functional status.       Balance Overall balance assessment: Needs assistance Sitting-balance support: No upper extremity supported, Feet supported Sitting balance-Leahy Scale: Fair   Postural control: Posterior lean Standing balance support: Bilateral upper extremity supported, Reliant on assistive device for balance Standing balance-Leahy Scale: Zero Standing balance comment: Pt was Max A during functional transfer to maintain balance and prevent fall.  Cognition Arousal/Alertness: Awake/alert Behavior During Therapy: WFL for tasks assessed/performed Overall Cognitive Status: Within Functional Limits for tasks assessed                    Pertinent Vitals/Pain Pain Assessment Pain Assessment: Faces Faces Pain Scale: Hurts even more Pain Location: LLE, neck Pain Descriptors / Indicators: Aching Pain Intervention(s): Monitored during session, Premedicated before session     PT Goals (current goals can now be found in the care plan section) Acute Rehab PT Goals Patient Stated Goal: improve ability to ambulate PT Goal Formulation: With patient Time For Goal Achievement: 07/15/22 Potential to Achieve Goals: Good Additional Goals Additional Goal #1: Pt to have negative Hallpike dix bilaterally. Progress towards PT goals: Progressing toward goals    Frequency    Min 3X/week      PT Plan Current plan remains appropriate       AM-PAC PT "6 Clicks" Mobility   Outcome Measure  Help needed turning from your back to your side while in a flat bed without using bedrails?: A Little Help needed moving from lying on your back to sitting on the side of a flat bed without using bedrails?: A Little Help needed moving to and from a bed to a chair (including a wheelchair)?: A Lot Help needed standing up from a chair using your arms (e.g., wheelchair or bedside chair)?: A Lot Help needed to walk in hospital room?: Total Help needed climbing 3-5 steps with a railing? : Total 6 Click Score: 12    End of Session Equipment Utilized During Treatment: Gait belt Activity Tolerance: Patient limited by pain Patient left: in bed;with call bell/phone within reach;with bed alarm set;with family/visitor present Nurse Communication: Mobility status PT Visit Diagnosis: Unsteadiness on feet (R26.81);Muscle weakness (generalized) (M62.81)     Time: 1400-1440 PT Time Calculation (min) (ACUTE ONLY): 40 min  Charges:  $Therapeutic Activity: 38-52 mins                    Tomma Rakers, DPT, CLT  Acute Rehabilitation Services Office: [phone] (Secure chat preferred)    Ander Purpura 07/04/2022, 4:15 PM

## 2022-07-04 NOTE — Care Management Important Message (Signed)
Important Message  Patient Details  Name: [name] MRN: [id number] Date of Birth: February 12, 1946   Medicare Important Message Given:  Yes     Orbie Pyo 07/04/2022, 4:15 PM

## 2022-07-05 DIAGNOSIS — M5031 Other cervical disc degeneration,  high cervical region: Secondary | ICD-10-CM | POA: Diagnosis not present

## 2022-07-05 DIAGNOSIS — K1379 Other lesions of oral mucosa: Secondary | ICD-10-CM | POA: Diagnosis not present

## 2022-07-05 DIAGNOSIS — I6359 Cerebral infarction due to unspecified occlusion or stenosis of other cerebral artery: Secondary | ICD-10-CM | POA: Diagnosis not present

## 2022-07-05 DIAGNOSIS — I480 Paroxysmal atrial fibrillation: Secondary | ICD-10-CM | POA: Diagnosis not present

## 2022-07-05 DIAGNOSIS — S81802A Unspecified open wound, left lower leg, initial encounter: Secondary | ICD-10-CM | POA: Diagnosis not present

## 2022-07-05 DIAGNOSIS — F5101 Primary insomnia: Secondary | ICD-10-CM | POA: Diagnosis not present

## 2022-07-05 DIAGNOSIS — L02212 Cutaneous abscess of back [any part, except buttock]: Secondary | ICD-10-CM | POA: Diagnosis not present

## 2022-07-05 DIAGNOSIS — R222 Localized swelling, mass and lump, trunk: Secondary | ICD-10-CM | POA: Diagnosis not present

## 2022-07-05 DIAGNOSIS — M545 Low back pain, unspecified: Secondary | ICD-10-CM | POA: Diagnosis not present

## 2022-07-05 DIAGNOSIS — H2589 Other age-related cataract: Secondary | ICD-10-CM | POA: Diagnosis not present

## 2022-07-05 DIAGNOSIS — R229 Localized swelling, mass and lump, unspecified: Secondary | ICD-10-CM | POA: Diagnosis not present

## 2022-07-05 DIAGNOSIS — N1831 Chronic kidney disease, stage 3a: Secondary | ICD-10-CM | POA: Diagnosis not present

## 2022-07-05 DIAGNOSIS — L02416 Cutaneous abscess of left lower limb: Secondary | ICD-10-CM | POA: Diagnosis not present

## 2022-07-05 DIAGNOSIS — I69311 Memory deficit following cerebral infarction: Secondary | ICD-10-CM | POA: Diagnosis not present

## 2022-07-05 DIAGNOSIS — B3749 Other urogenital candidiasis: Secondary | ICD-10-CM | POA: Diagnosis not present

## 2022-07-05 DIAGNOSIS — B370 Candidal stomatitis: Secondary | ICD-10-CM | POA: Diagnosis not present

## 2022-07-05 DIAGNOSIS — R0981 Nasal congestion: Secondary | ICD-10-CM | POA: Diagnosis not present

## 2022-07-05 DIAGNOSIS — E559 Vitamin D deficiency, unspecified: Secondary | ICD-10-CM | POA: Diagnosis not present

## 2022-07-05 DIAGNOSIS — M6281 Muscle weakness (generalized): Secondary | ICD-10-CM | POA: Diagnosis not present

## 2022-07-05 DIAGNOSIS — E1165 Type 2 diabetes mellitus with hyperglycemia: Secondary | ICD-10-CM | POA: Diagnosis not present

## 2022-07-05 DIAGNOSIS — K7581 Nonalcoholic steatohepatitis (NASH): Secondary | ICD-10-CM | POA: Diagnosis not present

## 2022-07-05 DIAGNOSIS — I10 Essential (primary) hypertension: Secondary | ICD-10-CM | POA: Diagnosis not present

## 2022-07-05 DIAGNOSIS — I5033 Acute on chronic diastolic (congestive) heart failure: Secondary | ICD-10-CM | POA: Diagnosis not present

## 2022-07-05 DIAGNOSIS — I679 Cerebrovascular disease, unspecified: Secondary | ICD-10-CM | POA: Diagnosis not present

## 2022-07-05 DIAGNOSIS — R601 Generalized edema: Secondary | ICD-10-CM | POA: Diagnosis not present

## 2022-07-05 DIAGNOSIS — D638 Anemia in other chronic diseases classified elsewhere: Secondary | ICD-10-CM | POA: Diagnosis not present

## 2022-07-05 DIAGNOSIS — Z79899 Other long term (current) drug therapy: Secondary | ICD-10-CM | POA: Diagnosis not present

## 2022-07-05 DIAGNOSIS — R300 Dysuria: Secondary | ICD-10-CM | POA: Diagnosis not present

## 2022-07-05 DIAGNOSIS — K7460 Unspecified cirrhosis of liver: Secondary | ICD-10-CM | POA: Diagnosis not present

## 2022-07-05 DIAGNOSIS — K7682 Hepatic encephalopathy: Secondary | ICD-10-CM | POA: Diagnosis not present

## 2022-07-05 DIAGNOSIS — E785 Hyperlipidemia, unspecified: Secondary | ICD-10-CM | POA: Diagnosis not present

## 2022-07-05 DIAGNOSIS — E876 Hypokalemia: Secondary | ICD-10-CM | POA: Diagnosis not present

## 2022-07-05 DIAGNOSIS — H8113 Benign paroxysmal vertigo, bilateral: Secondary | ICD-10-CM | POA: Diagnosis not present

## 2022-07-05 DIAGNOSIS — L03116 Cellulitis of left lower limb: Secondary | ICD-10-CM | POA: Diagnosis not present

## 2022-07-05 DIAGNOSIS — F411 Generalized anxiety disorder: Secondary | ICD-10-CM | POA: Diagnosis not present

## 2022-07-05 DIAGNOSIS — J984 Other disorders of lung: Secondary | ICD-10-CM | POA: Diagnosis not present

## 2022-07-05 DIAGNOSIS — I4719 Other supraventricular tachycardia: Secondary | ICD-10-CM | POA: Diagnosis not present

## 2022-07-05 DIAGNOSIS — R4189 Other symptoms and signs involving cognitive functions and awareness: Secondary | ICD-10-CM | POA: Diagnosis not present

## 2022-07-05 DIAGNOSIS — I4891 Unspecified atrial fibrillation: Secondary | ICD-10-CM | POA: Diagnosis not present

## 2022-07-05 DIAGNOSIS — F330 Major depressive disorder, recurrent, mild: Secondary | ICD-10-CM | POA: Diagnosis not present

## 2022-07-05 DIAGNOSIS — I8501 Esophageal varices with bleeding: Secondary | ICD-10-CM | POA: Diagnosis not present

## 2022-07-05 DIAGNOSIS — D72819 Decreased white blood cell count, unspecified: Secondary | ICD-10-CM | POA: Diagnosis not present

## 2022-07-05 DIAGNOSIS — R600 Localized edema: Secondary | ICD-10-CM | POA: Diagnosis not present

## 2022-07-05 DIAGNOSIS — E1169 Type 2 diabetes mellitus with other specified complication: Secondary | ICD-10-CM | POA: Diagnosis not present

## 2022-07-05 DIAGNOSIS — G4700 Insomnia, unspecified: Secondary | ICD-10-CM | POA: Diagnosis not present

## 2022-07-05 DIAGNOSIS — R5381 Other malaise: Secondary | ICD-10-CM | POA: Diagnosis not present

## 2022-07-05 DIAGNOSIS — I5032 Chronic diastolic (congestive) heart failure: Secondary | ICD-10-CM | POA: Diagnosis not present

## 2022-07-05 DIAGNOSIS — R29898 Other symptoms and signs involving the musculoskeletal system: Secondary | ICD-10-CM | POA: Diagnosis not present

## 2022-07-05 DIAGNOSIS — M5416 Radiculopathy, lumbar region: Secondary | ICD-10-CM | POA: Diagnosis not present

## 2022-07-05 DIAGNOSIS — R262 Difficulty in walking, not elsewhere classified: Secondary | ICD-10-CM | POA: Diagnosis not present

## 2022-07-05 DIAGNOSIS — N3020 Other chronic cystitis without hematuria: Secondary | ICD-10-CM | POA: Diagnosis not present

## 2022-07-05 DIAGNOSIS — K766 Portal hypertension: Secondary | ICD-10-CM | POA: Diagnosis not present

## 2022-07-05 DIAGNOSIS — I69322 Dysarthria following cerebral infarction: Secondary | ICD-10-CM | POA: Diagnosis not present

## 2022-07-05 DIAGNOSIS — M25552 Pain in left hip: Secondary | ICD-10-CM | POA: Diagnosis not present

## 2022-07-05 DIAGNOSIS — E538 Deficiency of other specified B group vitamins: Secondary | ICD-10-CM | POA: Diagnosis not present

## 2022-07-05 DIAGNOSIS — E440 Moderate protein-calorie malnutrition: Secondary | ICD-10-CM | POA: Diagnosis not present

## 2022-07-05 DIAGNOSIS — R059 Cough, unspecified: Secondary | ICD-10-CM | POA: Diagnosis not present

## 2022-07-05 DIAGNOSIS — K7469 Other cirrhosis of liver: Secondary | ICD-10-CM | POA: Diagnosis not present

## 2022-07-05 DIAGNOSIS — M79605 Pain in left leg: Secondary | ICD-10-CM | POA: Diagnosis not present

## 2022-07-05 DIAGNOSIS — G3184 Mild cognitive impairment, so stated: Secondary | ICD-10-CM | POA: Diagnosis not present

## 2022-07-06 DIAGNOSIS — R5381 Other malaise: Secondary | ICD-10-CM | POA: Diagnosis not present

## 2022-07-06 DIAGNOSIS — M25552 Pain in left hip: Secondary | ICD-10-CM | POA: Diagnosis not present

## 2022-07-06 DIAGNOSIS — R262 Difficulty in walking, not elsewhere classified: Secondary | ICD-10-CM | POA: Diagnosis not present

## 2022-07-07 ENCOUNTER — Telehealth: Payer: Self-pay | Admitting: Gastroenterology

## 2022-07-07 DIAGNOSIS — E785 Hyperlipidemia, unspecified: Secondary | ICD-10-CM | POA: Diagnosis not present

## 2022-07-07 DIAGNOSIS — E1169 Type 2 diabetes mellitus with other specified complication: Secondary | ICD-10-CM | POA: Diagnosis not present

## 2022-07-07 DIAGNOSIS — E559 Vitamin D deficiency, unspecified: Secondary | ICD-10-CM | POA: Diagnosis not present

## 2022-07-07 DIAGNOSIS — L03116 Cellulitis of left lower limb: Secondary | ICD-10-CM | POA: Diagnosis not present

## 2022-07-07 NOTE — Telephone Encounter (Signed)
Patient son called in  requesting a phone call from a nurse regarding patient assistance forms for Zifaxan medication .Please advise

## 2022-07-08 ENCOUNTER — Other Ambulatory Visit: Payer: Self-pay | Admitting: Interventional Radiology

## 2022-07-08 DIAGNOSIS — M25552 Pain in left hip: Secondary | ICD-10-CM | POA: Diagnosis not present

## 2022-07-08 DIAGNOSIS — K7581 Nonalcoholic steatohepatitis (NASH): Secondary | ICD-10-CM | POA: Diagnosis not present

## 2022-07-08 DIAGNOSIS — Z95828 Presence of other vascular implants and grafts: Secondary | ICD-10-CM

## 2022-07-08 DIAGNOSIS — N1831 Chronic kidney disease, stage 3a: Secondary | ICD-10-CM | POA: Diagnosis not present

## 2022-07-08 DIAGNOSIS — K1379 Other lesions of oral mucosa: Secondary | ICD-10-CM | POA: Diagnosis not present

## 2022-07-08 DIAGNOSIS — R29898 Other symptoms and signs involving the musculoskeletal system: Secondary | ICD-10-CM | POA: Diagnosis not present

## 2022-07-08 DIAGNOSIS — E1169 Type 2 diabetes mellitus with other specified complication: Secondary | ICD-10-CM | POA: Diagnosis not present

## 2022-07-08 DIAGNOSIS — R600 Localized edema: Secondary | ICD-10-CM | POA: Diagnosis not present

## 2022-07-08 DIAGNOSIS — R300 Dysuria: Secondary | ICD-10-CM | POA: Diagnosis not present

## 2022-07-08 DIAGNOSIS — R0981 Nasal congestion: Secondary | ICD-10-CM | POA: Diagnosis not present

## 2022-07-08 DIAGNOSIS — I10 Essential (primary) hypertension: Secondary | ICD-10-CM | POA: Diagnosis not present

## 2022-07-08 DIAGNOSIS — I480 Paroxysmal atrial fibrillation: Secondary | ICD-10-CM | POA: Diagnosis not present

## 2022-07-08 DIAGNOSIS — E538 Deficiency of other specified B group vitamins: Secondary | ICD-10-CM | POA: Diagnosis not present

## 2022-07-11 DIAGNOSIS — L02416 Cutaneous abscess of left lower limb: Secondary | ICD-10-CM | POA: Diagnosis not present

## 2022-07-11 DIAGNOSIS — L03116 Cellulitis of left lower limb: Secondary | ICD-10-CM | POA: Diagnosis not present

## 2022-07-11 DIAGNOSIS — M79605 Pain in left leg: Secondary | ICD-10-CM | POA: Diagnosis not present

## 2022-07-12 DIAGNOSIS — R229 Localized swelling, mass and lump, unspecified: Secondary | ICD-10-CM | POA: Diagnosis not present

## 2022-07-12 DIAGNOSIS — L02416 Cutaneous abscess of left lower limb: Secondary | ICD-10-CM | POA: Diagnosis not present

## 2022-07-12 DIAGNOSIS — M79605 Pain in left leg: Secondary | ICD-10-CM | POA: Diagnosis not present

## 2022-07-13 DIAGNOSIS — M79605 Pain in left leg: Secondary | ICD-10-CM | POA: Diagnosis not present

## 2022-07-13 DIAGNOSIS — J984 Other disorders of lung: Secondary | ICD-10-CM | POA: Diagnosis not present

## 2022-07-13 DIAGNOSIS — L02416 Cutaneous abscess of left lower limb: Secondary | ICD-10-CM | POA: Diagnosis not present

## 2022-07-13 DIAGNOSIS — R600 Localized edema: Secondary | ICD-10-CM | POA: Diagnosis not present

## 2022-07-13 DIAGNOSIS — R229 Localized swelling, mass and lump, unspecified: Secondary | ICD-10-CM | POA: Diagnosis not present

## 2022-07-14 DIAGNOSIS — L02416 Cutaneous abscess of left lower limb: Secondary | ICD-10-CM | POA: Diagnosis not present

## 2022-07-14 DIAGNOSIS — R229 Localized swelling, mass and lump, unspecified: Secondary | ICD-10-CM | POA: Diagnosis not present

## 2022-07-14 DIAGNOSIS — F5101 Primary insomnia: Secondary | ICD-10-CM | POA: Diagnosis not present

## 2022-07-14 DIAGNOSIS — R600 Localized edema: Secondary | ICD-10-CM | POA: Diagnosis not present

## 2022-07-15 DIAGNOSIS — R229 Localized swelling, mass and lump, unspecified: Secondary | ICD-10-CM | POA: Diagnosis not present

## 2022-07-15 DIAGNOSIS — R600 Localized edema: Secondary | ICD-10-CM | POA: Diagnosis not present

## 2022-07-15 DIAGNOSIS — R4189 Other symptoms and signs involving cognitive functions and awareness: Secondary | ICD-10-CM | POA: Diagnosis not present

## 2022-07-15 LAB — GLUCOSE, CAPILLARY: Glucose-Capillary: 271 mg/dL — ABNORMAL HIGH (ref 70–99)

## 2022-07-18 ENCOUNTER — Other Ambulatory Visit: Payer: Self-pay | Admitting: *Deleted

## 2022-07-18 DIAGNOSIS — L02416 Cutaneous abscess of left lower limb: Secondary | ICD-10-CM | POA: Diagnosis not present

## 2022-07-18 DIAGNOSIS — E1169 Type 2 diabetes mellitus with other specified complication: Secondary | ICD-10-CM | POA: Diagnosis not present

## 2022-07-18 DIAGNOSIS — R229 Localized swelling, mass and lump, unspecified: Secondary | ICD-10-CM | POA: Diagnosis not present

## 2022-07-18 DIAGNOSIS — E785 Hyperlipidemia, unspecified: Secondary | ICD-10-CM | POA: Diagnosis not present

## 2022-07-18 DIAGNOSIS — E559 Vitamin D deficiency, unspecified: Secondary | ICD-10-CM | POA: Diagnosis not present

## 2022-07-18 DIAGNOSIS — L03116 Cellulitis of left lower limb: Secondary | ICD-10-CM | POA: Diagnosis not present

## 2022-07-18 NOTE — Patient Outreach (Signed)
Per [facility] Mrs. Selke resides in [facility] skilled nursing facility. Screening for potential [place] care coordination services as benefit of health plan and Primary Care Provider.   Update previously received from [place], [facility] social worker. Mrs. Gorey's transition plan is to return home with spouse. Has supportive family.   Will continue to follow.  Marthenia Rolling, MSN, RN,BSN [place] Acute Care Coordinator [phone] (Direct dial)

## 2022-07-19 ENCOUNTER — Ambulatory Visit (INDEPENDENT_AMBULATORY_CARE_PROVIDER_SITE_OTHER): Payer: Medicare Other | Admitting: Family

## 2022-07-19 ENCOUNTER — Encounter: Payer: Self-pay | Admitting: Family

## 2022-07-19 VITALS — BP 118/58 | HR 68 | Temp 97.8°F | Resp 16 | Ht 63.0 in | Wt 123.0 lb

## 2022-07-19 DIAGNOSIS — S81802A Unspecified open wound, left lower leg, initial encounter: Secondary | ICD-10-CM | POA: Diagnosis not present

## 2022-07-19 DIAGNOSIS — R222 Localized swelling, mass and lump, trunk: Secondary | ICD-10-CM

## 2022-07-19 DIAGNOSIS — B3749 Other urogenital candidiasis: Secondary | ICD-10-CM

## 2022-07-19 DIAGNOSIS — L02416 Cutaneous abscess of left lower limb: Secondary | ICD-10-CM | POA: Diagnosis not present

## 2022-07-19 DIAGNOSIS — R600 Localized edema: Secondary | ICD-10-CM

## 2022-07-19 DIAGNOSIS — L02212 Cutaneous abscess of back [any part, except buttock]: Secondary | ICD-10-CM | POA: Diagnosis not present

## 2022-07-19 MED ORDER — NYSTATIN 100000 UNIT/GM EX CREA: 1.0000 | TOPICAL_CREAM | Freq: Two times a day (BID) | CUTANEOUS | 0 refills | Status: DC

## 2022-07-19 MED ORDER — DOXYCYCLINE HYCLATE 100 MG PO TABS: 100.0000 mg | ORAL_TABLET | Freq: Two times a day (BID) | ORAL | 0 refills | Status: AC

## 2022-07-19 NOTE — Progress Notes (Unsigned)
Provider: Omaria Plunk FNP-C  [name], MD  Patient Care Team: [name], MD as PCP - General (Family Medicine) Josue Hector, MD as PCP - Cardiology (Cardiology) Berle Mull, MD as Consulting Physician (Family Medicine) Sharmon Revere as Physician Assistant (Cardiology) Calvert Cantor, MD as Consulting Physician (Ophthalmology) [name], MD as Attending Physician (Gastroenterology) Lavonna Monarch, MD (Inactive) as Consulting Physician (Dermatology)  Extended Emergency Contact Information Primary Emergency Contact: Blase Mess Address: [address]          [place], [place] [address] Johnnette Litter of [place] Phone: [phone] Mobile Phone: [phone] Relation: Spouse Secondary Emergency Contact: Maness,Minnie Mobile Phone: [phone] Relation: Daughter  Code Status: Full Code  Goals of care: Advanced Directive information    06/30/2022    5:04 PM  Advanced Directives  Does Patient Have a Medical Advance Directive? No     Chief Complaint  Patient presents with   Leg Injury    Has a spot on left leg that needed checked out and a lump on back near spine to have checked. Rehab said strept is stuck to the bone and they are unable to get rid of it.     HPI:  Pt is a 77 y.o. female seen today for an acute visit for evaluation of left leg wound.she is here with the [facility] and daughter.Per son patient had a bump back in January,2024 that opened up wound culture was positive for strep and was treated with antibiotics.States was admitted on 06/30/2022 treated for left leg cellulitis.On chart review,had doppler studies which was negative for DVT.she was treated with cefazolin.also had edema TED stockings and leg elevation recommended. She currently in [facility] was advised to follow up with PCP.wound was packed with iodoform at the facility but has been draining. She denies any fever or chills. States redness and  tenderness persist.    Also complains of mid- lower back area swelling that has increased in size for the past couple of days.states swelling not painful.No drainage.   Past Medical History:  Diagnosis Date   Chronic cystitis    Chronic diastolic CHF (congestive heart failure) ([place])    followed by dr Johnsie Cancel   Chronic kidney disease, stage I    Cirrhosis of liver without ascites ([place]) 03/2016   followed by dr [name] (GI);   due to fatty liver;   post op cabg 09/ 2017  , post paracentesis for ascites   Coronary artery disease 01/2016   cardiologist--- dr Frances Nickels;   01-27-2016  NSTEMI  s/p cardiac cath;   s/p CABG 01/29/16: LIMA-LAD, SVG-RI, SVG-dRCA   DDD (degenerative disc disease), cervical    per pt w/ chronic neck pain   Dyspnea    occasional   Edema of right lower extremity    Esophageal varices ([place])    followed by dr [name];   hx multiple banding of large varices   Essential hypertension    no meds   GERD (gastroesophageal reflux disease)    Heart murmur     DX FOR YEARS ASYMPTOMATIC   Hepatic encephalopathy ([place])    History of adenomatous polyp of colon    History of COVID-19 04/2020   per pt mild  symptoms that resolve   History of kidney stones    passed stones   History of non-ST elevation myocardial infarction (NSTEMI) 01/27/2016   Hyperlipidemia    diet controlled   NAFLD (nonalcoholic fatty liver disease)  Neuropathy, peripheral 04/01/2015   OA (osteoarthritis)    Paroxysmal A-fib ([place])    08/19/19 afib with RVR, spontaneously converted to SR   Portal vein thrombosis    per dr [name]  ,  chronic non-occluded   Thrombocytopenia ([place])    followed by pcp  and hematology-- dr Burr Medico;   likely secondary to liver cirrhosis   Type 2 diabetes mellitus treated with insulin ([place])    Type 2, followed by pcp    (07-21-2021  per pt checks blood sugar dialy in am fasting average 113--200s)   Urge incontinence of urine    Wears hearing aid in both ears    Past Surgical  History:  Procedure Laterality Date   CARDIAC CATHETERIZATION N/A 01/27/2016   Procedure: Left Heart Cath and Coronary Angiography;  Surgeon: Belva Crome, MD;  Location: [place] [facility];  Service: Cardiovascular;  Laterality: N/A;   COLONOSCOPY WITH PROPOFOL N/A 07/07/2016   Procedure: COLONOSCOPY WITH PROPOFOL;  Surgeon: [name], MD;  Location: [facility];  Service: Endoscopy;  Laterality: N/A;   CORONARY ARTERY BYPASS GRAFT N/A 01/28/2016   Procedure: CORONARY ARTERY BYPASS GRAFTING (CABG) x 3 USING RIGHT LEG GREATER SAPHENOUS VEIN GRAFT;  Surgeon: Melrose Nakayama, MD;  Location: [place];  Service: Open Heart Surgery;  Laterality: N/A;   CYSTOSCOPY WITH RETROGRADE PYELOGRAM, URETEROSCOPY AND STENT PLACEMENT Left 05/30/2021   Procedure: CYSTOSCOPY WITH RETROGRADE PYELOGRAM, URETEROSCOPY AND STENT PLACEMENT;  Surgeon: Ceasar Mons, MD;  Location: [place];  Service: Urology;  Laterality: Left;   CYSTOSCOPY/URETEROSCOPY/HOLMIUM LASER/STENT PLACEMENT Left 07/26/2021   Procedure: CYSTOSCOPY/RETROGRADE/URETEROSCOPY/HOLMIUM LASER/STENT EXCHANGE;  Surgeon: Janith Lima, MD;  Location: [facility];  Service: Urology;  Laterality: Left;   ENDOVEIN HARVEST OF GREATER SAPHENOUS VEIN Right 01/28/2016   Procedure: ENDOVEIN HARVEST OF GREATER SAPHENOUS VEIN;  Surgeon: Melrose Nakayama, MD;  Location: [place];  Service: Open Heart Surgery;  Laterality: Right;   ESOPHAGEAL BANDING  03/28/2019   Procedure: ESOPHAGEAL BANDING;  Surgeon: [name], MD;  Location[facility];  Service: Endoscopy;;   ESOPHAGEAL BANDING  04/07/2019   Procedure: ESOPHAGEAL BANDING;  Surgeon: Juanita Craver, MD;  Location: [facility] ENDOSCOPY;  Service: Endoscopy;;   ESOPHAGOGASTRODUODENOSCOPY N/A 04/07/2019   Procedure: ESOPHAGOGASTRODUODENOSCOPY (EGD);  Surgeon: Juanita Craver, MD;  Location: [facility] ENDOSCOPY;  Service: Endoscopy;  Laterality: N/A;   ESOPHAGOGASTRODUODENOSCOPY (EGD) WITH  PROPOFOL N/A 07/07/2016   Procedure: ESOPHAGOGASTRODUODENOSCOPY (EGD) WITH PROPOFOL;  Surgeon: [name], MD;  [facility];  Service: Endoscopy;  Laterality: N/A;   ESOPHAGOGASTRODUODENOSCOPY (EGD) WITH PROPOFOL N/A 03/28/2019   Procedure: ESOPHAGOGASTRODUODENOSCOPY (EGD) WITH PROPOFOL;  Surgeon: [name], MD;  [facility];  Service: Endoscopy;  Laterality: N/A;   EXTRACORPOREAL SHOCK WAVE LITHOTRIPSY  2010   HEMOSTASIS CLIP PLACEMENT  04/07/2019   Procedure: HEMOSTASIS CLIP PLACEMENT;  Surgeon: Juanita Craver, MD;  Location: [place];  Service: Endoscopy;;   IR ANGIOGRAM SELECTIVE EACH ADDITIONAL VESSEL  04/08/2019   IR EMBO ART  VEN HEMORR LYMPH EXTRAV  INC GUIDE ROADMAPPING  04/08/2019   IR PARACENTESIS  04/08/2019   IR RADIOLOGIST EVAL & MGMT  06/13/2019   IR RADIOLOGIST EVAL & MGMT  09/25/2019   IR RADIOLOGIST EVAL & MGMT  07/07/2020   IR RADIOLOGIST EVAL & MGMT  07/30/2021   IR TIPS  04/08/2019   LAMINECTOMY WITH POSTERIOR LATERAL ARTHRODESIS LEVEL 1 Bilateral 02/25/2022   Procedure: Laminectomy and Foraminotomy - Lumbar four-Lumbar five - bilateral, posterolateral  arthrodesis Lumbar four-five, extension of hardware Lumbar four-five;  Surgeon: Eustace Moore, MD;  Location: [place];  Service: Neurosurgery;  Laterality: Bilateral;   MAXIMUM ACCESS (MAS)POSTERIOR LUMBAR INTERBODY FUSION (PLIF) 1 LEVEL Left 06/10/2015   Procedure: FOR MAXIMUM ACCESS (MAS) POSTERIOR LUMBAR INTERBODY FUSION (PLIF) LUMBAR THREE-FOUR EXTRAFORAMINAL MICRODISCECTOMY LUMBAR FIVE-SACRAL ONE LEFT;  Surgeon: Eustace Moore, MD;  Location: [place] [facility]URO [facility];  Service: Neurosurgery;  Laterality: Left;   RADIOLOGY WITH ANESTHESIA N/A 04/08/2019   Procedure: RADIOLOGY WITH ANESTHESIA;  Surgeon: Radiologist, Medication, MD;  Location: [place];  Service: Radiology;  Laterality: N/A;   SCLEROTHERAPY  04/07/2019   Procedure: SCLEROTHERAPY;  Surgeon: Juanita Craver, MD;  Location: [facility] ENDOSCOPY;   Service: Endoscopy;;   TEE WITHOUT CARDIOVERSION N/A 01/28/2016   Procedure: TRANSESOPHAGEAL ECHOCARDIOGRAM (TEE);  Surgeon: Melrose Nakayama, MD;  Location: [place];  Service: Open Heart Surgery;  Laterality: N/A;   TUBAL LIGATION  1982   Dr Lynwood Dawley HERNIA REPAIR N/A 09/10/2019   Procedure: HERNIA REPAIR UMBILICAL ADULT;  Surgeon: Donnie Mesa, MD;  Location: [place];  Service: General;  Laterality: N/A;   VAGINAL HYSTERECTOMY  1997   Dr Rande Lawman    Allergies  Allergen Reactions   Kiwi Extract Anaphylaxis   Gabapentin Nausea And Vomiting   Tdap [Tetanus-Diphth-Acell Pertussis] Swelling and Other (See Comments)    Swelling at injection site, gets very hot   Latex Itching, Dermatitis and Rash   Statins Other (See Comments)    RHABDOMYOLYSIS   Tramadol Nausea And Vomiting    Outpatient Encounter Medications as of 07/19/2022  Medication Sig   acetaminophen (TYLENOL) 500 MG tablet Take 500 mg by mouth every 6 (six) hours as needed for moderate pain.   aspirin EC 81 MG tablet Take 81 mg by mouth daily. Swallow whole.   Biotin 10000 MCG TABS Take 10 mg by mouth daily.   Calcium Carb-Cholecalciferol (CALCIUM 600 + D PO) Take 1 tablet by mouth in the morning and at bedtime.   Cholecalciferol (VITAMIN D-3) 25 MCG (1000 UT) CAPS Take 1,000 Units by mouth daily.   Cyanocobalamin (VITAMIN B 12 PO) Take 1,000 mcg by mouth daily.   empagliflozin (JARDIANCE) 25 MG TABS tablet Take 1 tablet (25 mg total) by mouth daily.   ezetimibe (ZETIA) 10 MG tablet Take 1 tablet (10 mg total) by mouth daily.   fish oil-omega-3 fatty acids 1000 MG capsule Take 1 g by mouth daily.   fluticasone (FLONASE) 50 MCG/ACT nasal spray Place 1 spray into both nostrils daily as needed for allergies or rhinitis.   furosemide (LASIX) 20 MG tablet TAKE 1 TABLET(20 MG) BY MOUTH DAILY (Patient taking differently: Take 20 mg by mouth 2 (two) times daily.)   glucose blood test strip One Touch Ultra Blue test strips.  Use to test blood sugar three times daily. Dx: E11.65   insulin degludec (TRESIBA FLEXTOUCH) 100 UNIT/ML FlexTouch Pen Inject 18 Units into the skin at bedtime.   insulin lispro (HUMALOG) 100 UNIT/ML injection Inject 0-0.12 mLs (0-12 Units total) into the skin See admin instructions. Before each meal 3 times a day, 140-199 - 2 units, 200-250 - 4 units, 251-299 - 6 units,  300-349 - 8 units,  350 or above 10 units.   Insulin Pen Needle (B-D ULTRAFINE III SHORT PEN) 31G X 8 MM MISC Use to give Insulin injections daily. Dx: E11.65   Insulin Syringe-Needle U-100 (INSULIN SYRINGE 1CC/31GX5/16") 31G X 5/16" 1 ML MISC USE AS DIRECTED  TO INJECT LEVIMIR   LACTOBACILLUS PO Take 1 capsule by mouth daily.   lactulose (CHRONULAC) 10 GM/15ML solution TAKE 30 ML BY MOUTH EVERY DAY (Patient taking differently: Take 30 mLs by mouth daily.)   MAGNESIUM PO Take 400 mg by mouth daily.   meclizine (ANTIVERT) 25 MG tablet Take 1 tablet (25 mg total) by mouth 3 (three) times daily as needed for dizziness.   melatonin 3 MG TABS tablet Take 3 mg by mouth at bedtime.   Nutritional Supplements (NUTRITIONAL DRINK PO) Take 120 mLs by mouth daily. Medpass/House supplement   ondansetron (ZOFRAN) 4 MG/2ML SOLN injection Inject 4 mg into the vein once.   oxyCODONE (OXY IR/ROXICODONE) 5 MG immediate release tablet Take 0.5 tablets (2.5 mg total) by mouth every 6 (six) hours as needed for severe pain.   pantoprazole (PROTONIX) 40 MG tablet TAKE 1 TABLET(40 MG) BY MOUTH DAILY (Patient taking differently: Take 40 mg by mouth daily.)   Polyethyl Glycol-Propyl Glycol (SYSTANE OP) Place 1 drop into both eyes at bedtime.   potassium chloride (KLOR-CON M) 10 MEQ tablet Take 20 mEq by mouth 2 (two) times daily.   rifaximin (XIFAXAN) 550 MG TABS tablet Take 550 mg by mouth 2 (two) times daily.   traZODone (DESYREL) 50 MG tablet Take 25 mg by mouth at bedtime.   XIFAXAN 550 MG TABS tablet Take 1 tablet (550 mg total) by mouth 2 (two) times  daily.   No facility-administered encounter medications on file as of 07/19/2022.    Review of Systems  Constitutional:  Positive for unexpected weight change. Negative for appetite change, chills, fatigue and fever.  Respiratory:  Negative for cough, chest tightness, shortness of breath and wheezing.   Cardiovascular:  Negative for chest pain, palpitations and leg swelling.  Gastrointestinal:  Negative for abdominal distention, abdominal pain, diarrhea, nausea and vomiting.  Musculoskeletal:  Positive for gait problem. Negative for arthralgias, back pain, joint swelling, myalgias, neck pain and neck stiffness.  Skin:  Positive for wound. Negative for color change, pallor and rash.       Left leg wound  Mid back swelling  Neurological:  Negative for dizziness, weakness, light-headedness, numbness and headaches.    Immunization History  Administered Date(s) Administered   Fluad Quad(high Dose 65+) 01/31/2019, 02/03/2020, 02/17/2021, 03/07/2022   Hep A / Hep B 06/24/2016, 07/04/2016, 07/21/2016   Influenza Whole 02/28/2012   Influenza, High Dose Seasonal PF 01/26/2017, 01/29/2018   Influenza,inj,Quad PF,6+ Mos 03/27/2013, 01/16/2014, 03/31/2015, 02/26/2016   Pneumococcal Conjugate-13 06/28/2011   Pneumococcal Polysaccharide-23 04/19/2016   Td 01/12/1999   Pertinent  Health Maintenance Due  Topic Date Due   OPHTHALMOLOGY EXAM  08/24/2018   COLONOSCOPY (Pts 45-57yr Insurance coverage will need to be confirmed)  07/07/2021   HEMOGLOBIN A1C  09/22/2022   FOOT EXAM  01/05/2023   INFLUENZA VACCINE  Completed   DEXA SCAN  Completed      01/26/2022   11:14 AM 02/25/2022    9:27 AM 02/25/2022    2:24 PM 03/24/2022   10:03 AM 05/18/2022    1:10 PM  Fall Risk  Falls in the past year?    1 0  Was there an injury with Fall?    0 0  Fall Risk Category Calculator    1 0  Fall Risk Category (Retired)    Low Low  (RETIRED) Patient Fall Risk Level Low fall risk Low fall risk Moderate fall  risk Low fall risk Low fall risk  Patient at  Risk for Falls Due to    History of fall(s) No Fall Risks  Fall risk Follow up    Falls evaluation completed Falls evaluation completed   Functional Status Survey:    Vitals:   07/19/22 1337  BP: (!) 118/58  Pulse: 68  Resp: 16  Temp: 97.8 F (36.6 C)  TempSrc: Temporal  SpO2: 97%  Weight: 123 lb (55.8 kg)  Height: '5\' 3"'$  (1.6 m)   Body mass index is 21.79 kg/m. Physical Exam Vitals reviewed.  Constitutional:      General: She is not in acute distress.    Appearance: Normal appearance. She is normal weight. She is not ill-appearing or diaphoretic.  HENT:     Head: Normocephalic.     Mouth/Throat:     Mouth: Mucous membranes are moist.     Pharynx: Oropharynx is clear. No oropharyngeal exudate or posterior oropharyngeal erythema.  Eyes:     General: No scleral icterus.       Right eye: No discharge.        Left eye: No discharge.     Conjunctiva/sclera: Conjunctivae normal.     Pupils: Pupils are equal, round, and reactive to light.  Cardiovascular:     Rate and Rhythm: Normal rate and regular rhythm.     Pulses: Normal pulses.     Heart sounds: Murmur heard.     No friction rub. No gallop.  Pulmonary:     Effort: Pulmonary effort is normal. No respiratory distress.     Breath sounds: Normal breath sounds. No wheezing, rhonchi or rales.  Chest:     Chest wall: No tenderness.  Abdominal:     General: Bowel sounds are normal. There is no distension.     Palpations: Abdomen is soft. There is no mass.     Tenderness: There is no abdominal tenderness. There is no right CVA tenderness, left CVA tenderness, guarding or rebound.  Musculoskeletal:        General: No swelling or tenderness. Normal range of motion.     Right lower leg: Edema present.     Left lower leg: Edema present.     Comments: Unsteady gait on wheelchair during visit   Skin:    General: Skin is warm and dry.     Coloration: Skin is not pale.     Findings:  No bruising, erythema, lesion or rash.     Comments: 1. Lower mid back 6 X 6 cm mass soft consistency with several hard areas.Non-tender to palpation and without any erythema.   2. Bilateral groin and perineum area beefy redness   3. Left lateral leg open wound approximately 2 cm wide with extensive tunneling and Tender.Old dressing with dry serosanguinous drainage.iodoform removed.cleansed with wound cleanser ,pat dry and packed with iodoform and covered with foam gauze for absorption.leg wrapped with Kerlix and ACE wrap from base of the toes to below the knee.Tolerated procedure well.       Neurological:     Mental Status: She is alert and oriented to person, place, and time.     Cranial Nerves: No cranial nerve deficit.     Sensory: No sensory deficit.     Motor: No weakness.     Coordination: Coordination normal.     Gait: Gait normal.  Psychiatric:        Mood and Affect: Mood normal.        Speech: Speech normal.        Behavior: Behavior normal.  Thought Content: Thought content normal.        Judgment: Judgment normal.     Labs reviewed: Recent Labs    07/01/22 0143 07/03/22 0319 07/03/22 0323 07/04/22 0411  NA 136  --  136 134*  K 4.0  --  3.3* 4.2  CL 105  --  106 105  CO2 20*  --  23 21*  GLUCOSE 152*  --  134* 191*  BUN 13  --  11 11  CREATININE 0.80  --  0.72 0.72  CALCIUM 8.4*  --  7.8* 7.8*  MG 1.9 1.8  --  1.9   Recent Labs    05/27/22 2208 06/30/22 1715 07/01/22 0143  AST 47* 61* 53*  ALT 24 33 30  ALKPHOS 74 125 116  BILITOT 2.8* 2.2* 2.6*  PROT 6.9 6.7 6.6  ALBUMIN 3.2* 2.0* 1.9*   Recent Labs    05/27/22 2208 06/01/22 1215 06/30/22 1715 07/01/22 0143 07/03/22 0323 07/04/22 0411  WBC 12.9*   < > 11.9* 13.5* 7.7 9.7  NEUTROABS 10.5*  --  10.5* 11.4*  --   --   HGB 13.8   < > 11.6* 12.1 10.6* 11.9*  HCT 42.6   < > 34.6* 37.8 31.5* 35.6*  MCV 94.5   < > 93.5 96.2 92.1 92.7  PLT 82*   < > 166 197 150 190   < > = values in this  interval not displayed.   Lab Results  Component Value Date   TSH 2.468 05/27/2022   Lab Results  Component Value Date   HGBA1C 9.2 (H) 03/24/2022   Lab Results  Component Value Date   CHOL 174 01/04/2022   HDL 89 01/04/2022   LDLCALC 68 01/04/2022   TRIG 91 01/04/2022   CHOLHDL 2.0 01/04/2022    Significant Diagnostic Results in last 30 days:  VAS [place] LOWER EXTREMITY VENOUS (DVT)  Result Date: 07/01/2022  Lower Venous DVT Study Patient Name:  YAILIN SWEAZY [facility]  Date of Exam:   07/01/2022 Medical Rec #: [id number]               Accession #:    [id number] Date of Birth: 09-12-1945               Patient Gender: F Patient Age:   77 years Exam Location:  [facility] Procedure:      VAS [place] LOWER EXTREMITY VENOUS (DVT) Referring Phys: Bretta Bang GONFA --------------------------------------------------------------------------------  Indications: Left leg swelling.  Limitations: Poor ultrasound/tissue interface. Comparison Study: Previous exam on 06/02/21 was negative for DVT Performing Technologist: Rogelia Rohrer RVT, RDMS  Examination Guidelines: A complete evaluation includes B-mode imaging, spectral Doppler, color Doppler, and power Doppler as needed of all accessible portions of each vessel. Bilateral testing is considered an integral part of a complete examination. Limited examinations for reoccurring indications may be performed as noted. The reflux portion of the exam is performed with the patient in reverse Trendelenburg.  +---------+---------------+---------+-----------+----------+-------------------+ RIGHT    CompressibilityPhasicitySpontaneityPropertiesThrombus Aging      +---------+---------------+---------+-----------+----------+-------------------+ CFV      Full           No       Yes                                      +---------+---------------+---------+-----------+----------+-------------------+ SFJ      Full                                                              +---------+---------------+---------+-----------+----------+-------------------+  FV Prox  Full           No       Yes                                      +---------+---------------+---------+-----------+----------+-------------------+ FV Mid   Full           No       Yes                                      +---------+---------------+---------+-----------+----------+-------------------+ FV DistalFull           No       Yes                                      +---------+---------------+---------+-----------+----------+-------------------+ PFV      Full                                                             +---------+---------------+---------+-----------+----------+-------------------+ POP      Full           No       Yes                                      +---------+---------------+---------+-----------+----------+-------------------+ PTV      Full                                                             +---------+---------------+---------+-----------+----------+-------------------+ PERO                                                  Not well visualized +---------+---------------+---------+-----------+----------+-------------------+ pulsatile flow  +---------+---------------+---------+-----------+----------+--------------+ LEFT     CompressibilityPhasicitySpontaneityPropertiesThrombus Aging +---------+---------------+---------+-----------+----------+--------------+ CFV      Full           No       Yes                                 +---------+---------------+---------+-----------+----------+--------------+ SFJ      Full                                                        +---------+---------------+---------+-----------+----------+--------------+ FV Prox  Full           No       Yes                                 +---------+---------------+---------+-----------+----------+--------------+  FV Mid   Full           No        Yes                                 +---------+---------------+---------+-----------+----------+--------------+ FV DistalFull           No       Yes                                 +---------+---------------+---------+-----------+----------+--------------+ PFV      Full                                                        +---------+---------------+---------+-----------+----------+--------------+ POP      Full           No       Yes                                 +---------+---------------+---------+-----------+----------+--------------+ PTV      Full                                                        +---------+---------------+---------+-----------+----------+--------------+ PERO     Full                                                        +---------+---------------+---------+-----------+----------+--------------+ pulsatile flow    Summary: BILATERAL: - No evidence of deep vein thrombosis seen in the lower extremities, bilaterally. -No evidence of popliteal cyst, bilaterally. -Subcutaneous edema, bilaterally  *See table(s) above for measurements and observations. Electronically signed by Orlie Pollen on 07/01/2022 at 6:55:24 PM.    Final    DG HIP UNILAT WITH PELVIS 2-3 VIEWS LEFT  Result Date: 07/01/2022 CLINICAL DATA:  Left hip pain, recent fall in January EXAM: DG HIP (WITH OR WITHOUT PELVIS) 2-3V LEFT COMPARISON:  05/27/2022 FINDINGS: Bones are osteopenic. Fusion hardware noted of the visualized lower lumbar spine. Bony pelvis and hips appear symmetric and intact. No displaced hip fracture or malalignment. Nonobstructive bowel gas pattern. IMPRESSION: Osteopenia. No acute finding by plain radiography. Electronically Signed   By: Jerilynn Mages.  Shick M.D.   On: 07/01/2022 14:44   MR BRAIN WO CONTRAST  Result Date: 06/30/2022 CLINICAL DATA:  Horizontal nystagmus.  Dizziness and nausea. EXAM: MRI HEAD WITHOUT CONTRAST TECHNIQUE: Multiplanar, multiecho pulse  sequences of the brain and surrounding structures were obtained without intravenous contrast. COMPARISON:  CT Head 05/28/22 FINDINGS: Brain: No acute infarction, hemorrhage, hydrocephalus, extra-axial collection or mass lesion. Sequela of moderate chronic microvascular ischemic change. There is T2/FLAIR hyperintense signal in the subcortical white matter in the bilateral parietal lobes, which is also favored to represent sequela of chronic ischemic change. Vascular: Right A1 flow void is not visualized, possibly congenital Skull and  upper cervical spine: Normal marrow signal. Sinuses/Orbits: Trace right mastoid effusion. Mild mucosal thickening bilateral ethmoid air cells. Other: None. IMPRESSION: No acute intracranial process. Electronically Signed   By: Marin Roberts M.D.   On: 06/30/2022 20:03    Assessment/Plan 1. Open wound of left lower extremity, initial encounter Afebrile  Left lateral leg open wound approximately 2 cm wide with extensive tunneling and Tender.Old dressing with dry serosanguinous drainage.iodoform removed.cleansed with wound cleanser ,pat dry and packed with iodoform and covered with foam gauze for absorption.leg wrapped with Kerlix and ACE wrap from base of the toes to below the knee.Tolerated procedure well.     - Ambulatory referral to Wound Clinic - doxycycline (VIBRA-TABS) 100 MG tablet; Take 1 tablet (100 mg total) by mouth 2 (two) times daily for 7 days.  Dispense: 14 tablet; Refill: 0 - Ambulatory referral to [place] - orders written and send to the facility.  2. Mass of skin of back Lower mid back 6 X 6 cm mass soft consistency with several hard areas.Non-tender to palpation and without any erythema.  - Ambulatory referral to General Surgery  3. Candidiasis of perineum Bilateral groin and perineum area beefy redness  - nystatin cream (MYCOSTATIN); Apply 1 Application topically 2 (two) times daily. Perineum area  Dispense: 30 g; Refill: 0  4. Bilateral lower  extremity edema Advised to wear knee high compression stockings on in the morning and off at bedtime. - Compression stockings  Family/ staff Communication: Reviewed plan of care with patient  Labs/tests ordered: None   Next Appointment: Return if symptoms worsen or fail to improve.Follow up with wound clinic.    Sandrea Hughs, NP

## 2022-07-19 NOTE — Patient Instructions (Signed)
Home health Nurse to cleanse left leg wound with saline,pat dry,pack with iodoform strip and cover with foam dressing for absorption.wrap leg from below base of toes to below knee with kerlix and ACE wrap to keep swelling down.change dressing every 3 days. Notify provider for any symptoms of infection.

## 2022-07-20 DIAGNOSIS — L02416 Cutaneous abscess of left lower limb: Secondary | ICD-10-CM | POA: Diagnosis not present

## 2022-07-20 DIAGNOSIS — L02212 Cutaneous abscess of back [any part, except buttock]: Secondary | ICD-10-CM | POA: Diagnosis not present

## 2022-07-21 ENCOUNTER — Telehealth: Payer: Self-pay

## 2022-07-21 NOTE — Telephone Encounter (Signed)
-   [place].Rehab facility to manage wound dressing.

## 2022-07-21 NOTE — Telephone Encounter (Signed)
Debbie with [facility] called about referral that was place 03/12/204 requesting in home wound care. She stated that they can't provide services due to patient being in SNF.   Message routed to Marlowe Sax, NP

## 2022-07-22 DIAGNOSIS — D72819 Decreased white blood cell count, unspecified: Secondary | ICD-10-CM | POA: Diagnosis not present

## 2022-07-22 DIAGNOSIS — L02416 Cutaneous abscess of left lower limb: Secondary | ICD-10-CM | POA: Diagnosis not present

## 2022-07-22 DIAGNOSIS — L02212 Cutaneous abscess of back [any part, except buttock]: Secondary | ICD-10-CM | POA: Diagnosis not present

## 2022-07-22 NOTE — Telephone Encounter (Signed)
Ok to cancel the referral since pt is SNF?    Please advise

## 2022-07-22 NOTE — Telephone Encounter (Signed)
Referral has been closed

## 2022-07-25 DIAGNOSIS — L02416 Cutaneous abscess of left lower limb: Secondary | ICD-10-CM | POA: Diagnosis not present

## 2022-07-25 DIAGNOSIS — L02212 Cutaneous abscess of back [any part, except buttock]: Secondary | ICD-10-CM | POA: Diagnosis not present

## 2022-07-25 DIAGNOSIS — Z79899 Other long term (current) drug therapy: Secondary | ICD-10-CM | POA: Diagnosis not present

## 2022-07-25 DIAGNOSIS — E1165 Type 2 diabetes mellitus with hyperglycemia: Secondary | ICD-10-CM | POA: Diagnosis not present

## 2022-07-26 DIAGNOSIS — L02416 Cutaneous abscess of left lower limb: Secondary | ICD-10-CM | POA: Diagnosis not present

## 2022-07-26 DIAGNOSIS — K1379 Other lesions of oral mucosa: Secondary | ICD-10-CM | POA: Diagnosis not present

## 2022-07-26 DIAGNOSIS — L02212 Cutaneous abscess of back [any part, except buttock]: Secondary | ICD-10-CM | POA: Diagnosis not present

## 2022-07-26 DIAGNOSIS — E1165 Type 2 diabetes mellitus with hyperglycemia: Secondary | ICD-10-CM | POA: Diagnosis not present

## 2022-07-26 DIAGNOSIS — B370 Candidal stomatitis: Secondary | ICD-10-CM | POA: Diagnosis not present

## 2022-07-28 DIAGNOSIS — F330 Major depressive disorder, recurrent, mild: Secondary | ICD-10-CM | POA: Diagnosis not present

## 2022-07-28 DIAGNOSIS — G3184 Mild cognitive impairment, so stated: Secondary | ICD-10-CM | POA: Diagnosis not present

## 2022-07-28 DIAGNOSIS — G4700 Insomnia, unspecified: Secondary | ICD-10-CM | POA: Diagnosis not present

## 2022-07-28 DIAGNOSIS — M5416 Radiculopathy, lumbar region: Secondary | ICD-10-CM | POA: Diagnosis not present

## 2022-07-28 DIAGNOSIS — L02212 Cutaneous abscess of back [any part, except buttock]: Secondary | ICD-10-CM | POA: Diagnosis not present

## 2022-07-28 DIAGNOSIS — R29898 Other symptoms and signs involving the musculoskeletal system: Secondary | ICD-10-CM | POA: Diagnosis not present

## 2022-07-28 NOTE — Telephone Encounter (Signed)
PAP was faxed to [facility] on 12.18.23 with confirmation fax. Called and spoke with [facility] they said they hadn't received application. Was provided an alternated fax number [phone]. Fax to the number provided and the number on the application [phone] again. Rep said that I can call back on Mon/Tues to see if it's been received and ask for it to be expedited.

## 2022-07-28 NOTE — Telephone Encounter (Signed)
PAP [facility] for Xifaxan faxed. Received confirmation.

## 2022-07-29 ENCOUNTER — Telehealth: Payer: Self-pay

## 2022-07-29 ENCOUNTER — Other Ambulatory Visit: Payer: Self-pay | Admitting: *Deleted

## 2022-07-29 DIAGNOSIS — I10 Essential (primary) hypertension: Secondary | ICD-10-CM | POA: Diagnosis not present

## 2022-07-29 DIAGNOSIS — I480 Paroxysmal atrial fibrillation: Secondary | ICD-10-CM | POA: Diagnosis not present

## 2022-07-29 DIAGNOSIS — K7581 Nonalcoholic steatohepatitis (NASH): Secondary | ICD-10-CM | POA: Diagnosis not present

## 2022-07-29 DIAGNOSIS — E1169 Type 2 diabetes mellitus with other specified complication: Secondary | ICD-10-CM | POA: Diagnosis not present

## 2022-07-29 NOTE — Patient Outreach (Signed)
[facility] Post- Acute Care Coordinator follow up. Verified in [facility] Mrs. Volker discharged from [facility] SNF on 07/29/22. Screening for potential [facility] care coordination services as benefit of health plan and PCP.   Telephone call made to primary contact number on file, son Kadeja Need [facility]) [phone]. Patient identifiers confirmed. Suezanne Jacquet states Mrs. Service is home now. [facility] is the home health agency and they have made contact. Confirms PCP appointment scheduled for next week. Also has plans for wound care clinic follow up. Mrs. Lhotka lives at home with spouse. [name] Psychologist, clinical to contact Mrs.Thun to discuss [facility] care coordination services at [phone] (home phone).   Telephone call made to Mrs. Kraft at [phone]. Patient identifiers confirmed. Explained [facility] care coordination services. Mrs. Longaker pleasantly declines. States home health will be enough.   Marthenia Rolling, MSN, RN,BSN [place] Acute Care Coordinator [phone] (Direct dial)

## 2022-07-29 NOTE — Telephone Encounter (Signed)
[name] with [facility] called requesting verbal orders for delay in start of care. Start of care delayed unti 3/26. Verbal orders were given.

## 2022-08-02 DIAGNOSIS — I8500 Esophageal varices without bleeding: Secondary | ICD-10-CM | POA: Diagnosis not present

## 2022-08-02 DIAGNOSIS — Z7984 Long term (current) use of oral hypoglycemic drugs: Secondary | ICD-10-CM | POA: Diagnosis not present

## 2022-08-02 DIAGNOSIS — K7460 Unspecified cirrhosis of liver: Secondary | ICD-10-CM | POA: Diagnosis not present

## 2022-08-02 DIAGNOSIS — N1831 Chronic kidney disease, stage 3a: Secondary | ICD-10-CM | POA: Diagnosis not present

## 2022-08-02 DIAGNOSIS — E538 Deficiency of other specified B group vitamins: Secondary | ICD-10-CM | POA: Diagnosis not present

## 2022-08-02 DIAGNOSIS — E1142 Type 2 diabetes mellitus with diabetic polyneuropathy: Secondary | ICD-10-CM | POA: Diagnosis not present

## 2022-08-02 DIAGNOSIS — L89893 Pressure ulcer of other site, stage 3: Secondary | ICD-10-CM | POA: Diagnosis not present

## 2022-08-02 DIAGNOSIS — E876 Hypokalemia: Secondary | ICD-10-CM | POA: Diagnosis not present

## 2022-08-02 DIAGNOSIS — I480 Paroxysmal atrial fibrillation: Secondary | ICD-10-CM | POA: Diagnosis not present

## 2022-08-02 DIAGNOSIS — R42 Dizziness and giddiness: Secondary | ICD-10-CM | POA: Diagnosis not present

## 2022-08-02 DIAGNOSIS — M25552 Pain in left hip: Secondary | ICD-10-CM | POA: Diagnosis not present

## 2022-08-02 DIAGNOSIS — E1165 Type 2 diabetes mellitus with hyperglycemia: Secondary | ICD-10-CM | POA: Diagnosis not present

## 2022-08-02 DIAGNOSIS — M810 Age-related osteoporosis without current pathological fracture: Secondary | ICD-10-CM | POA: Diagnosis not present

## 2022-08-02 DIAGNOSIS — Z794 Long term (current) use of insulin: Secondary | ICD-10-CM | POA: Diagnosis not present

## 2022-08-02 DIAGNOSIS — D509 Iron deficiency anemia, unspecified: Secondary | ICD-10-CM | POA: Diagnosis not present

## 2022-08-02 DIAGNOSIS — Z8673 Personal history of transient ischemic attack (TIA), and cerebral infarction without residual deficits: Secondary | ICD-10-CM | POA: Diagnosis not present

## 2022-08-02 DIAGNOSIS — N3020 Other chronic cystitis without hematuria: Secondary | ICD-10-CM | POA: Diagnosis not present

## 2022-08-02 DIAGNOSIS — I25118 Atherosclerotic heart disease of native coronary artery with other forms of angina pectoris: Secondary | ICD-10-CM | POA: Diagnosis not present

## 2022-08-02 DIAGNOSIS — E1122 Type 2 diabetes mellitus with diabetic chronic kidney disease: Secondary | ICD-10-CM | POA: Diagnosis not present

## 2022-08-02 DIAGNOSIS — I5033 Acute on chronic diastolic (congestive) heart failure: Secondary | ICD-10-CM | POA: Diagnosis not present

## 2022-08-02 DIAGNOSIS — L03116 Cellulitis of left lower limb: Secondary | ICD-10-CM | POA: Diagnosis not present

## 2022-08-02 DIAGNOSIS — F411 Generalized anxiety disorder: Secondary | ICD-10-CM | POA: Diagnosis not present

## 2022-08-02 DIAGNOSIS — K7581 Nonalcoholic steatohepatitis (NASH): Secondary | ICD-10-CM | POA: Diagnosis not present

## 2022-08-02 DIAGNOSIS — I130 Hypertensive heart and chronic kidney disease with heart failure and stage 1 through stage 4 chronic kidney disease, or unspecified chronic kidney disease: Secondary | ICD-10-CM | POA: Diagnosis not present

## 2022-08-02 DIAGNOSIS — I493 Ventricular premature depolarization: Secondary | ICD-10-CM | POA: Diagnosis not present

## 2022-08-03 ENCOUNTER — Encounter: Payer: Self-pay | Admitting: Nurse Practitioner

## 2022-08-03 ENCOUNTER — Ambulatory Visit (INDEPENDENT_AMBULATORY_CARE_PROVIDER_SITE_OTHER): Payer: Medicare Other | Admitting: Nurse Practitioner

## 2022-08-03 VITALS — BP 120/60 | HR 76 | Temp 97.6°F | Resp 16 | Ht 63.0 in | Wt 119.8 lb

## 2022-08-03 DIAGNOSIS — I5032 Chronic diastolic (congestive) heart failure: Secondary | ICD-10-CM

## 2022-08-03 DIAGNOSIS — K7460 Unspecified cirrhosis of liver: Secondary | ICD-10-CM | POA: Diagnosis not present

## 2022-08-03 DIAGNOSIS — R188 Other ascites: Secondary | ICD-10-CM | POA: Diagnosis not present

## 2022-08-03 DIAGNOSIS — E1159 Type 2 diabetes mellitus with other circulatory complications: Secondary | ICD-10-CM

## 2022-08-03 DIAGNOSIS — E440 Moderate protein-calorie malnutrition: Secondary | ICD-10-CM

## 2022-08-03 DIAGNOSIS — S81802D Unspecified open wound, left lower leg, subsequent encounter: Secondary | ICD-10-CM | POA: Diagnosis not present

## 2022-08-03 DIAGNOSIS — L89311 Pressure ulcer of right buttock, stage 1: Secondary | ICD-10-CM

## 2022-08-03 DIAGNOSIS — F01B0 Vascular dementia, moderate, without behavioral disturbance, psychotic disturbance, mood disturbance, and anxiety: Secondary | ICD-10-CM | POA: Diagnosis not present

## 2022-08-03 DIAGNOSIS — Z794 Long term (current) use of insulin: Secondary | ICD-10-CM

## 2022-08-03 MED ORDER — TRESIBA FLEXTOUCH 100 UNIT/ML ~~LOC~~ SOPN: 20.0000 [IU] | PEN_INJECTOR | Freq: Every day | SUBCUTANEOUS | 0 refills | Status: DC

## 2022-08-03 NOTE — Patient Instructions (Signed)
Recommend low sugar protein supplement daily after meal To have 3 meals a day.

## 2022-08-03 NOTE — Progress Notes (Unsigned)
Careteam: Patient Care Team: [name], MD as PCP - General (Family Medicine) Josue Hector, MD as PCP - Cardiology (Cardiology) Berle Mull, MD as Consulting Physician (Family Medicine) Sharmon Revere as Physician Assistant (Cardiology) Calvert Cantor, MD as Consulting Physician (Ophthalmology) [name], MD as Attending Physician (Gastroenterology) Lavonna Monarch, MD (Inactive) as Consulting Physician (Dermatology)  PLACE OF SERVICE:  [place]  Advanced Directive information    Allergies  Allergen Reactions   Kiwi Extract Anaphylaxis   Gabapentin Nausea And Vomiting   Tdap [Tetanus-Diphth-Acell Pertussis] Swelling and Other (See Comments)    Swelling at injection site, gets very hot   Latex Itching, Dermatitis and Rash   Statins Other (See Comments)    RHABDOMYOLYSIS   Tramadol Nausea And Vomiting    Chief Complaint  Patient presents with   Transitions Of Care    [facility] 06/30/2022-07/04/2022     HPI: Patient is a 77 y.o. female for rehab follow up She was hospitalizated from 2/22-2/26 then went to rehab until 07/29/22 and was discharged home.  States things have been going well at home.  Home health care is coming out twice a week to change dressing. Reported it was doing well. Family reports there is drainage.  She was treated for cellulitis and redness has improved.  She has a follow up with wound care on April 9th.   Reports vertigo has improved.   Gait continues to be off but she has not been walking like she used to.   Her A1c is not controlled she is on tresiba 20 units, this morning blood sugar was 117 She is only checking blood sugar in the morning.  She is taking jardiance She has humalog but not taking.  States tresiba is too expensive, she also has lantus at home per daughter.   She is unable to get her test strips. Reports issue with pharmacy verifying with insurance.   Home health coming out to help with strength and gait  training.  Has nursing and PT.   Daughter is with pt daily and helps.   She has lost significant amount of weight. Reports she has a good appetite but forgetting to eat. She has had ongoing memory loss since her CVA. Having a hard time managing medication and family is helping too.   Review of Systems:  Review of Systems  Constitutional:  Positive for malaise/fatigue and weight loss. Negative for chills and fever.  HENT:  Negative for tinnitus.   Respiratory:  Negative for cough, sputum production and shortness of breath.   Cardiovascular:  Negative for chest pain, palpitations and leg swelling.  Gastrointestinal:  Negative for abdominal pain, constipation, diarrhea and heartburn.  Genitourinary:  Negative for dysuria, frequency and urgency.  Musculoskeletal:  Negative for back pain, falls, joint pain and myalgias.  Skin: Negative.   Neurological:  Negative for dizziness and headaches.  Psychiatric/Behavioral:  Positive for memory loss. Negative for depression. The patient does not have insomnia.     Past Medical History:  Diagnosis Date   Chronic cystitis    Chronic diastolic CHF (congestive heart failure) ([place])    followed by dr Johnsie Cancel   Chronic kidney disease, stage I    Cirrhosis of liver without ascites ([place]) 03/2016   followed by dr [name] (GI);   due to fatty liver;   post op cabg 09/ 2017  , post paracentesis for ascites   Coronary artery disease 01/2016   cardiologist--- dr Frances Nickels;  01-27-2016  NSTEMI  s/p cardiac cath;   s/p CABG 01/29/16: LIMA-LAD, SVG-RI, SVG-dRCA   DDD (degenerative disc disease), cervical    per pt w/ chronic neck pain   Dyspnea    occasional   Edema of right lower extremity    Esophageal varices (HCC)    followed by dr [name];   hx multiple banding of large varices   Essential hypertension    no meds   GERD (gastroesophageal reflux disease)    Heart murmur     DX FOR YEARS ASYMPTOMATIC   Hepatic encephalopathy ([place])    History of adenomatous  polyp of colon    History of COVID-19 04/2020   per pt mild  symptoms that resolve   History of kidney stones    passed stones   History of non-ST elevation myocardial infarction (NSTEMI) 01/27/2016   Hyperlipidemia    diet controlled   NAFLD (nonalcoholic fatty liver disease)    Neuropathy, peripheral 04/01/2015   OA (osteoarthritis)    Paroxysmal A-fib ([place])    08/19/19 afib with RVR, spontaneously converted to SR   Portal vein thrombosis    per dr [name]  ,  chronic non-occluded   Thrombocytopenia ([place])    followed by pcp  and hematology-- dr Burr Medico;   likely secondary to liver cirrhosis   Type 2 diabetes mellitus treated with insulin ([place])    Type 2, followed by pcp    (07-21-2021  per pt checks blood sugar dialy in am fasting average 113--200s)   Urge incontinence of urine    Wears hearing aid in both ears    Past Surgical History:  Procedure Laterality Date   CARDIAC CATHETERIZATION N/A 01/27/2016   Procedure: Left Heart Cath and Coronary Angiography;  Surgeon: Belva Crome, MD;  Location: [place] CV LAB;  Service: Cardiovascular;  Laterality: N/A;   COLONOSCOPY WITH PROPOFOL N/A 07/07/2016   Procedure: COLONOSCOPY WITH PROPOFOL;  Surgeon: [name], MD;  Location: [facility] ENDOSCOPY;  Service: Endoscopy;  Laterality: N/A;   CORONARY ARTERY BYPASS GRAFT N/A 01/28/2016   Procedure: CORONARY ARTERY BYPASS GRAFTING (CABG) x 3 USING RIGHT LEG GREATER SAPHENOUS VEIN GRAFT;  Surgeon: Melrose Nakayama, MD;  Location: [place];  Service: Open Heart Surgery;  Laterality: N/A;   CYSTOSCOPY WITH RETROGRADE PYELOGRAM, URETEROSCOPY AND STENT PLACEMENT Left 05/30/2021   Procedure: CYSTOSCOPY WITH RETROGRADE PYELOGRAM, URETEROSCOPY AND STENT PLACEMENT;  Surgeon: Ceasar Mons, MD;  Location: [place];  Service: Urology;  Laterality: Left;   CYSTOSCOPY/URETEROSCOPY/HOLMIUM LASER/STENT PLACEMENT Left 07/26/2021   Procedure: CYSTOSCOPY/RETROGRADE/URETEROSCOPY/HOLMIUM LASER/STENT  EXCHANGE;  Surgeon: Janith Lima, MD;  Location: [facility];  Service: Urology;  Laterality: Left;   ENDOVEIN HARVEST OF GREATER SAPHENOUS VEIN Right 01/28/2016   Procedure: ENDOVEIN HARVEST OF GREATER SAPHENOUS VEIN;  Surgeon: Melrose Nakayama, MD;  Location: [place];  Service: Open Heart Surgery;  Laterality: Right;   ESOPHAGEAL BANDING  03/28/2019   Procedure: ESOPHAGEAL BANDING;  Surgeon: [name], MD;  [facility];  Service: Endoscopy;;   ESOPHAGEAL BANDING  04/07/2019   Procedure: ESOPHAGEAL BANDING;  Surgeon: Juanita Craver, MD;  Location: [facility] END[facility]COPY;  Service: Endoscopy;;   ESOPHAGOGASTRODUODENOSCOPY N/A 04/07/2019   Procedure: ESOPHAGOGASTRODUODENOSCOPY (EGD);  Surgeon: Juanita Craver, MD;  Location: [facility] ENDOSCOPY;  Service: Endoscopy;  Laterality: N/A;   ESOPHAGOGASTRODUODENOSCOPY (EGD) WITH PROPOFOL N/A 07/07/2016   Procedure: ESOPHAGOGASTRODUODENOSCOPY (EGD) WITH PROPOFOL;  Surgeon: [name], MD;  [facility];  Service: Endoscopy;  Laterality:  N/A;   ESOPHAGOGASTRODUODENOSCOPY (EGD) WITH PROPOFOL N/A 03/28/2019   Procedure: ESOPHAGOGASTRODUODENOSCOPY (EGD) WITH PROPOFOL;  Surgeon: [name], MD;  [facility];  Service: Endoscopy;  Laterality: N/A;   EXTRACORPOREAL SHOCK WAVE LITHOTRIPSY  2010   HEMOSTASIS CLIP PLACEMENT  04/07/2019   Procedure: HEMOSTASIS CLIP PLACEMENT;  Surgeon: Juanita Craver, MD;  Location: [place];  Service: Endoscopy;;   IR ANGIOGRAM SELECTIVE EACH ADDITIONAL VESSEL  04/08/2019   IR EMBO ART  VEN HEMORR LYMPH EXTRAV  INC GUIDE ROADMAPPING  04/08/2019   IR PARACENTESIS  04/08/2019   IR RADIOLOGIST EVAL & MGMT  06/13/2019   IR RADIOLOGIST EVAL & MGMT  09/25/2019   IR RADIOLOGIST EVAL & MGMT  07/07/2020   IR RADIOLOGIST EVAL & MGMT  07/30/2021   IR TIPS  04/08/2019   LAMINECTOMY WITH POSTERIOR LATERAL ARTHRODESIS LEVEL 1 Bilateral 02/25/2022   Procedure: Laminectomy and Foraminotomy -  Lumbar four-Lumbar five - bilateral, posterolateral arthrodesis Lumbar four-five, extension of hardware Lumbar four-five;  Surgeon: Eustace Moore, MD;  Location: [place];  Service: Neurosurgery;  Laterality: Bilateral;   MAXIMUM ACCESS (MAS)POSTERIOR LUMBAR INTERBODY FUSION (PLIF) 1 LEVEL Left 06/10/2015   Procedure: FOR MAXIMUM ACCESS (MAS) POSTERIOR LUMBAR INTERBODY FUSION (PLIF) LUMBAR THREE-FOUR EXTRAFORAMINAL MICRODISCECTOMY LUMBAR FIVE-SACRAL ONE LEFT;  Surgeon: Eustace Moore, MD;  Location: [place] NEURO ORS;  Service: Neurosurgery;  Laterality: Left;   RADIOLOGY WITH ANESTHESIA N/A 04/08/2019   Procedure: RADIOLOGY WITH ANESTHESIA;  Surgeon: Radiologist, Medication, MD;  Location: [place];  Service: Radiology;  Laterality: N/A;   SCLEROTHERAPY  04/07/2019   Procedure: SCLEROTHERAPY;  Surgeon: Juanita Craver, MD;  Location: [facility] ENDOSCOPY;  Service: Endoscopy;;   TEE WITHOUT CARDIOVERSION N/A 01/28/2016   Procedure: TRANSESOPHAGEAL ECHOCARDIOGRAM (TEE);  Surgeon: Melrose Nakayama, MD;  Location: [place];  Service: Open Heart Surgery;  Laterality: N/A;   TUBAL LIGATION  1982   Dr Lynwood Dawley HERNIA REPAIR N/A 09/10/2019   Procedure: HERNIA REPAIR UMBILICAL ADULT;  Surgeon: Donnie Mesa, MD;  Location: [place];  Service: General;  Laterality: N/A;   VAGINAL HYSTERECTOMY  1997   Dr Rande Lawman   Social History:   reports that she has never smoked. She has never used smokeless tobacco. She reports that she does not drink alcohol and does not use drugs.  Family History  Problem Relation Age of Onset   Heart disease Mother    Breast cancer Mother    Lung cancer Father    Arthritis Sister    Arthritis Brother    Liver cancer Brother    Lymphoma Brother    Heart disease Maternal Grandmother    Heart disease Maternal Grandfather    Heart disease Paternal Grandmother    Heart disease Paternal Grandfather    Other Daughter        house fire   Breast cancer Maternal Aunt    Breast cancer  Paternal Aunt    Colon cancer Neg Hx    Esophageal cancer Neg Hx    Rectal cancer Neg Hx    Stomach cancer Neg Hx     Medications: Patient's Medications  New Prescriptions   No medications on file  Previous Medications   ACETAMINOPHEN (TYLENOL) 500 MG TABLET    Take 500 mg by mouth every 6 (six) hours as needed for moderate pain.   ASPIRIN EC 81 MG TABLET    Take 81 mg by mouth daily. Swallow whole.   BIOTIN 16109 MCG TABS    Take  10 mg by mouth daily.   CALCIUM CARB-CHOLECALCIFEROL (CALCIUM 600 + D PO)    Take 1 tablet by mouth in the morning and at bedtime.   CHOLECALCIFEROL (VITAMIN D-3) 25 MCG (1000 UT) CAPS    Take 1,000 Units by mouth daily.   CYANOCOBALAMIN (VITAMIN B 12 PO)    Take 1,000 mcg by mouth daily.   EMPAGLIFLOZIN (JARDIANCE) 25 MG TABS TABLET    Take 1 tablet (25 mg total) by mouth daily.   EZETIMIBE (ZETIA) 10 MG TABLET    Take 1 tablet (10 mg total) by mouth daily.   FISH OIL-OMEGA-3 FATTY ACIDS 1000 MG CAPSULE    Take 1 g by mouth daily.   FLUTICASONE (FLONASE) 50 MCG/ACT NASAL SPRAY    Place 1 spray into both nostrils daily as needed for allergies or rhinitis.   FUROSEMIDE (LASIX) 20 MG TABLET    Take 40 mg by mouth daily.   GLUCOSE BLOOD TEST STRIP    One Touch Ultra Blue test strips. Use to test blood sugar three times daily. Dx: E11.65   INSULIN DEGLUDEC (TRESIBA FLEXTOUCH) 100 UNIT/ML FLEXTOUCH PEN    Inject 18 Units into the skin at bedtime.   INSULIN LISPRO (HUMALOG) 100 UNIT/ML INJECTION    Inject 0-0.12 mLs (0-12 Units total) into the skin See admin instructions. Before each meal 3 times a day, 140-199 - 2 units, 200-250 - 4 units, 251-299 - 6 units,  300-349 - 8 units,  350 or above 10 units.   INSULIN PEN NEEDLE (B-D ULTRAFINE III SHORT PEN) 31G X 8 MM MISC    Use to give Insulin injections daily. Dx: E11.65   INSULIN SYRINGE-NEEDLE U-100 (INSULIN SYRINGE 1CC/31GX5/16") 31G X 5/16" 1 ML MISC    USE AS DIRECTED TO INJECT LEVIMIR   LACTOBACILLUS PO    Take 1  capsule by mouth daily.   LACTULOSE (CHRONULAC) 10 GM/15ML SOLUTION    TAKE 30 ML BY MOUTH EVERY DAY   MAGNESIUM PO    Take 400 mg by mouth daily.   MECLIZINE (ANTIVERT) 25 MG TABLET    Take 1 tablet (25 mg total) by mouth 3 (three) times daily as needed for dizziness.   MELATONIN 3 MG TABS TABLET    Take 3 mg by mouth at bedtime.   NYSTATIN CREAM (MYCOSTATIN)    Apply 1 Application topically 2 (two) times daily. Perineum area   ONDANSETRON (ZOFRAN) 4 MG/2ML SOLN INJECTION    Inject 4 mg into the vein once.   OXYCODONE (OXY IR/ROXICODONE) 5 MG IMMEDIATE RELEASE TABLET    Take 0.5 tablets (2.5 mg total) by mouth every 6 (six) hours as needed for severe pain.   PANTOPRAZOLE (PROTONIX) 40 MG TABLET    TAKE 1 TABLET(40 MG) BY MOUTH DAILY   POLYETHYL GLYCOL-PROPYL GLYCOL (SYSTANE OP)    Place 1 drop into both eyes at bedtime.   POTASSIUM CHLORIDE (KLOR-CON M) 10 MEQ TABLET    Take 20 mEq by mouth 2 (two) times daily.   RIFAXIMIN (XIFAXAN) 550 MG TABS TABLET    Take 550 mg by mouth 2 (two) times daily.  Modified Medications   No medications on file  Discontinued Medications   FUROSEMIDE (LASIX) 20 MG TABLET    TAKE 1 TABLET(20 MG) BY MOUTH DAILY   NUTRITIONAL SUPPLEMENTS (NUTRITIONAL DRINK PO)    Take 120 mLs by mouth daily. Medpass/House supplement   TRAZODONE (DESYREL) 50 MG TABLET    Take 25 mg by mouth at  bedtime.   XIFAXAN 550 MG TABS TABLET    Take 1 tablet (550 mg total) by mouth 2 (two) times daily.    Physical Exam:  Vitals:   08/03/22 1414  BP: 120/60  Pulse: 76  Resp: 16  Temp: 97.6 F (36.4 C)  SpO2: 99%  Weight: 119 lb 12.8 oz (54.3 kg)  Height: 5\' 3"  (1.6 m)   Body mass index is 21.22 kg/m. Wt Readings from Last 3 Encounters:  08/03/22 119 lb 12.8 oz (54.3 kg)  07/19/22 123 lb (55.8 kg)  07/03/22 141 lb 1.5 oz (64 kg)    Physical Exam Constitutional:      General: She is not in acute distress.    Appearance: She is well-developed. She is not diaphoretic.  HENT:      Head: Normocephalic and atraumatic.     Mouth/Throat:     Pharynx: No oropharyngeal exudate.  Eyes:     Conjunctiva/sclera: Conjunctivae normal.     Pupils: Pupils are equal, round, and reactive to light.  Cardiovascular:     Rate and Rhythm: Normal rate and regular rhythm.     Heart sounds: Normal heart sounds.  Pulmonary:     Effort: Pulmonary effort is normal.     Breath sounds: Normal breath sounds.  Abdominal:     General: Bowel sounds are normal.     Palpations: Abdomen is soft.  Musculoskeletal:     Cervical back: Normal range of motion and neck supple.     Right lower leg: No edema.     Left lower leg: No edema.  Skin:    General: Skin is warm and dry.  Neurological:     Mental Status: She is alert. Mental status is at baseline.  Psychiatric:        Mood and Affect: Mood normal.   Right lower leg wound    Labs reviewed: Basic Metabolic Panel: Recent Labs    05/27/22 2208 06/30/22 1715 07/01/22 0143 07/03/22 0319 07/03/22 0323 07/04/22 0411  NA 133*   < > 136  --  136 134*  K 3.9   < > 4.0  --  3.3* 4.2  CL 101   < > 105  --  106 105  CO2 20*   < > 20*  --  23 21*  GLUCOSE 158*   < > 152*  --  134* 191*  BUN 22   < > 13  --  11 11  CREATININE 0.85   < > 0.80  --  0.72 0.72  CALCIUM 9.5   < > 8.4*  --  7.8* 7.8*  MG  --   --  1.9 1.8  --  1.9  TSH 2.468  --   --   --   --   --    < > = values in this interval not displayed.   Liver Function Tests: Recent Labs    05/27/22 2208 06/30/22 1715 07/01/22 0143  AST 47* 61* 53*  ALT 24 33 30  ALKPHOS 74 125 116  BILITOT 2.8* 2.2* 2.6*  PROT 6.9 6.7 6.6  ALBUMIN 3.2* 2.0* 1.9*   No results for input(s): "LIPASE", "AMYLASE" in the last 8760 hours. Recent Labs    06/30/22 1715  AMMONIA 24   CBC: Recent Labs    05/27/22 2208 06/01/22 1215 06/30/22 1715 07/01/22 0143 07/03/22 0323 07/04/22 0411  WBC 12.9*   < > 11.9* 13.5* 7.7 9.7  NEUTROABS 10.5*  --  10.5* 11.4*  --   --  HGB 13.8   <  > 11.6* 12.1 10.6* 11.9*  HCT 42.6   < > 34.6* 37.8 31.5* 35.6*  MCV 94.5   < > 93.5 96.2 92.1 92.7  PLT 82*   < > 166 197 150 190   < > = values in this interval not displayed.   Lipid Panel: Recent Labs    01/04/22 1434  CHOL 174  HDL 89  LDLCALC 68  TRIG 91  CHOLHDL 2.0   TSH: Recent Labs    05/27/22 2208  TSH 2.468   A1C: Lab Results  Component Value Date   HGBA1C 9.2 (H) 03/24/2022     Assessment/Plan 1. Moderate protein-calorie malnutrition ([place]) -encouraged to liberalize diet. To have protein supplement in addition to smallest meal of the day. - Complete Metabolic Panel with eGFR  2. Cirrhosis of liver with ascites, unspecified hepatic cirrhosis type (HCC) Stable, Continues on lactulose and xifaxan.  - Complete Metabolic Panel with eGFR  3. Type 2 diabetes mellitus with other circulatory complication, with long-term current use of insulin (HCC) -Encouraged dietary compliance, routine foot care/monitoring and to keep up with diabetic eye exams through ophthalmology  -continues on jardiance 25 mg daily with Tresiba 20 units.  -plan to start checking blood sugars twice daily -encouraged eating regularly to avoid hypoglycemia  - Hemoglobin A1c - Complete Metabolic Panel with eGFR - insulin degludec (TRESIBA FLEXTOUCH) 100 UNIT/ML FlexTouch Pen; Inject 20 Units into the skin at bedtime.  Dispense: 100 mL; Refill: 0  4. 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   5. Open wound of left lower extremity, subsequent encounter -has wound care appt pending. Continues with nursing care twice weekly  6. Chronic diastolic congestive heart failure (HCC) Euvolemic, continues on   7. Pressure injury of right buttock, stage 1 - mepilex applied, to change every 3 days or sooner if becomes soiled.   -pressure reduction and proper nutrition stressed.   Carlos American. [place], [facility] [phone]

## 2022-08-04 DIAGNOSIS — I5033 Acute on chronic diastolic (congestive) heart failure: Secondary | ICD-10-CM | POA: Diagnosis not present

## 2022-08-04 DIAGNOSIS — E1122 Type 2 diabetes mellitus with diabetic chronic kidney disease: Secondary | ICD-10-CM | POA: Diagnosis not present

## 2022-08-04 DIAGNOSIS — I130 Hypertensive heart and chronic kidney disease with heart failure and stage 1 through stage 4 chronic kidney disease, or unspecified chronic kidney disease: Secondary | ICD-10-CM | POA: Diagnosis not present

## 2022-08-04 DIAGNOSIS — L03116 Cellulitis of left lower limb: Secondary | ICD-10-CM | POA: Diagnosis not present

## 2022-08-04 DIAGNOSIS — L89893 Pressure ulcer of other site, stage 3: Secondary | ICD-10-CM | POA: Diagnosis not present

## 2022-08-04 DIAGNOSIS — N1831 Chronic kidney disease, stage 3a: Secondary | ICD-10-CM | POA: Diagnosis not present

## 2022-08-04 LAB — COMPLETE METABOLIC PANEL WITH GFR
AG Ratio: 0.8 (calc) — ABNORMAL LOW (ref 1.0–2.5)
ALT: 21 U/L (ref 6–29)
AST: 31 U/L (ref 10–35)
Albumin: 3 g/dL — ABNORMAL LOW (ref 3.6–5.1)
Alkaline phosphatase (APISO): 79 U/L (ref 37–153)
BUN: 16 mg/dL (ref 7–25)
CO2: 28 mmol/L (ref 20–32)
Calcium: 8.6 mg/dL (ref 8.6–10.4)
Chloride: 102 mmol/L (ref 98–110)
Creat: 0.64 mg/dL (ref 0.60–1.00)
Globulin: 3.8 g/dL (calc) — ABNORMAL HIGH (ref 1.9–3.7)
Glucose, Bld: 272 mg/dL — ABNORMAL HIGH (ref 65–99)
Potassium: 4 mmol/L (ref 3.5–5.3)
Sodium: 138 mmol/L (ref 135–146)
Total Bilirubin: 1.6 mg/dL — ABNORMAL HIGH (ref 0.2–1.2)
Total Protein: 6.8 g/dL (ref 6.1–8.1)
eGFR: 92 mL/min/{1.73_m2} (ref >=60)

## 2022-08-04 LAB — HEMOGLOBIN A1C
Hgb A1c MFr Bld: 8.8 % of total Hgb — ABNORMAL HIGH (ref <5.7)
Mean Plasma Glucose: 206 mg/dL
eAG (mmol/L): 11.4 mmol/L

## 2022-08-04 NOTE — Telephone Encounter (Signed)
Patient's son informed patient has been enrolled in PAP for Xifaxan through 05/09/23.

## 2022-08-08 DIAGNOSIS — L03116 Cellulitis of left lower limb: Secondary | ICD-10-CM | POA: Diagnosis not present

## 2022-08-08 DIAGNOSIS — I130 Hypertensive heart and chronic kidney disease with heart failure and stage 1 through stage 4 chronic kidney disease, or unspecified chronic kidney disease: Secondary | ICD-10-CM | POA: Diagnosis not present

## 2022-08-08 DIAGNOSIS — E1122 Type 2 diabetes mellitus with diabetic chronic kidney disease: Secondary | ICD-10-CM | POA: Diagnosis not present

## 2022-08-08 DIAGNOSIS — I5033 Acute on chronic diastolic (congestive) heart failure: Secondary | ICD-10-CM | POA: Diagnosis not present

## 2022-08-08 DIAGNOSIS — N1831 Chronic kidney disease, stage 3a: Secondary | ICD-10-CM | POA: Diagnosis not present

## 2022-08-08 DIAGNOSIS — L89893 Pressure ulcer of other site, stage 3: Secondary | ICD-10-CM | POA: Diagnosis not present

## 2022-08-09 DIAGNOSIS — L03116 Cellulitis of left lower limb: Secondary | ICD-10-CM | POA: Diagnosis not present

## 2022-08-09 DIAGNOSIS — I5033 Acute on chronic diastolic (congestive) heart failure: Secondary | ICD-10-CM | POA: Diagnosis not present

## 2022-08-09 DIAGNOSIS — E1122 Type 2 diabetes mellitus with diabetic chronic kidney disease: Secondary | ICD-10-CM | POA: Diagnosis not present

## 2022-08-09 DIAGNOSIS — N1831 Chronic kidney disease, stage 3a: Secondary | ICD-10-CM | POA: Diagnosis not present

## 2022-08-09 DIAGNOSIS — L89893 Pressure ulcer of other site, stage 3: Secondary | ICD-10-CM | POA: Diagnosis not present

## 2022-08-09 DIAGNOSIS — I130 Hypertensive heart and chronic kidney disease with heart failure and stage 1 through stage 4 chronic kidney disease, or unspecified chronic kidney disease: Secondary | ICD-10-CM | POA: Diagnosis not present

## 2022-08-10 DIAGNOSIS — I5033 Acute on chronic diastolic (congestive) heart failure: Secondary | ICD-10-CM | POA: Diagnosis not present

## 2022-08-10 DIAGNOSIS — E1122 Type 2 diabetes mellitus with diabetic chronic kidney disease: Secondary | ICD-10-CM | POA: Diagnosis not present

## 2022-08-10 DIAGNOSIS — L03116 Cellulitis of left lower limb: Secondary | ICD-10-CM | POA: Diagnosis not present

## 2022-08-10 DIAGNOSIS — L89893 Pressure ulcer of other site, stage 3: Secondary | ICD-10-CM | POA: Diagnosis not present

## 2022-08-10 DIAGNOSIS — N1831 Chronic kidney disease, stage 3a: Secondary | ICD-10-CM | POA: Diagnosis not present

## 2022-08-10 DIAGNOSIS — I130 Hypertensive heart and chronic kidney disease with heart failure and stage 1 through stage 4 chronic kidney disease, or unspecified chronic kidney disease: Secondary | ICD-10-CM | POA: Diagnosis not present

## 2022-08-11 ENCOUNTER — Ambulatory Visit: Payer: Medicare Other | Admitting: Family

## 2022-08-12 ENCOUNTER — Encounter: Payer: Medicare Other | Admitting: Family

## 2022-08-12 DIAGNOSIS — L03116 Cellulitis of left lower limb: Secondary | ICD-10-CM | POA: Diagnosis not present

## 2022-08-12 DIAGNOSIS — E1122 Type 2 diabetes mellitus with diabetic chronic kidney disease: Secondary | ICD-10-CM | POA: Diagnosis not present

## 2022-08-12 DIAGNOSIS — I5033 Acute on chronic diastolic (congestive) heart failure: Secondary | ICD-10-CM | POA: Diagnosis not present

## 2022-08-12 DIAGNOSIS — N1831 Chronic kidney disease, stage 3a: Secondary | ICD-10-CM | POA: Diagnosis not present

## 2022-08-12 DIAGNOSIS — L89893 Pressure ulcer of other site, stage 3: Secondary | ICD-10-CM | POA: Diagnosis not present

## 2022-08-12 DIAGNOSIS — I130 Hypertensive heart and chronic kidney disease with heart failure and stage 1 through stage 4 chronic kidney disease, or unspecified chronic kidney disease: Secondary | ICD-10-CM | POA: Diagnosis not present

## 2022-08-15 ENCOUNTER — Encounter: Payer: Medicare Other | Admitting: Family

## 2022-08-15 ENCOUNTER — Encounter: Payer: Self-pay | Admitting: Family

## 2022-08-15 DIAGNOSIS — Z0000 Encounter for general adult medical examination without abnormal findings: Secondary | ICD-10-CM

## 2022-08-15 DIAGNOSIS — L03116 Cellulitis of left lower limb: Secondary | ICD-10-CM | POA: Diagnosis not present

## 2022-08-15 DIAGNOSIS — E1122 Type 2 diabetes mellitus with diabetic chronic kidney disease: Secondary | ICD-10-CM | POA: Diagnosis not present

## 2022-08-15 DIAGNOSIS — N1831 Chronic kidney disease, stage 3a: Secondary | ICD-10-CM | POA: Diagnosis not present

## 2022-08-15 DIAGNOSIS — I130 Hypertensive heart and chronic kidney disease with heart failure and stage 1 through stage 4 chronic kidney disease, or unspecified chronic kidney disease: Secondary | ICD-10-CM | POA: Diagnosis not present

## 2022-08-15 DIAGNOSIS — L89893 Pressure ulcer of other site, stage 3: Secondary | ICD-10-CM | POA: Diagnosis not present

## 2022-08-15 DIAGNOSIS — I5033 Acute on chronic diastolic (congestive) heart failure: Secondary | ICD-10-CM | POA: Diagnosis not present

## 2022-08-15 NOTE — Progress Notes (Signed)
     This encounter was created in error - please disregard. Error patient unable to unmute video and phone kept dropping when she tried to use audio on the phone.appt rescheduled.

## 2022-08-16 ENCOUNTER — Encounter (HOSPITAL_BASED_OUTPATIENT_CLINIC_OR_DEPARTMENT_OTHER): Payer: Medicare Other | Attending: General Surgery | Admitting: Internal Medicine

## 2022-08-16 DIAGNOSIS — L89312 Pressure ulcer of right buttock, stage 2: Secondary | ICD-10-CM | POA: Insufficient documentation

## 2022-08-16 DIAGNOSIS — Z794 Long term (current) use of insulin: Secondary | ICD-10-CM | POA: Diagnosis not present

## 2022-08-16 DIAGNOSIS — E1140 Type 2 diabetes mellitus with diabetic neuropathy, unspecified: Secondary | ICD-10-CM | POA: Diagnosis not present

## 2022-08-16 DIAGNOSIS — L97825 Non-pressure chronic ulcer of other part of left lower leg with muscle involvement without evidence of necrosis: Secondary | ICD-10-CM | POA: Diagnosis not present

## 2022-08-16 DIAGNOSIS — E11622 Type 2 diabetes mellitus with other skin ulcer: Secondary | ICD-10-CM | POA: Insufficient documentation

## 2022-08-16 DIAGNOSIS — K7460 Unspecified cirrhosis of liver: Secondary | ICD-10-CM | POA: Diagnosis not present

## 2022-08-16 DIAGNOSIS — E43 Unspecified severe protein-calorie malnutrition: Secondary | ICD-10-CM | POA: Diagnosis not present

## 2022-08-16 NOTE — Progress Notes (Signed)
BLAISE, PALLADINO ([id number]) [id number] Page 1 of 11 Visit Report for 08/16/2022 Allergy List Details Patient Name: Date of Service: [name] 08/16/2022 9:30 A M Medical Record Number: [id number] Patient Account Number: [id number] Date of Birth/Sex: Treating RN: [birth year] (77 y.o. Ardis Rowan, Lauren Primary Care Malory Spurr: Jacalyn Lefevre Other Clinician: Referring Sreshta Cressler: Treating Krystine Pabst/Extender: Geralyn Corwin Ngetich, Dinah Weeks in Treatment: 0 Allergies Active Allergies kiwi Reaction: anaphylaxis gabapentin Boostrix Tdap latex Statins-HMG-CoA Reductase Inhibitors tramadol Allergy Notes Electronic Signature(s) Signed: 08/16/2022 2:53:03 PM By: Thayer Dallas Entered By: Thayer Dallas on 08/16/2022 09:55:02 -------------------------------------------------------------------------------- Arrival Information Details Patient Name: Date of Service: Kayla July Conway. 08/16/2022 9:30 A M Medical Record Number: [id number] Patient Account Number: [id number] Date of Birth/Sex: Treating RN: 1945-08-09 (77 y.o. F) Primary Care Kol Consuegra: Jacalyn Lefevre Other Clinician: Referring Aragon Scarantino: Treating Demosthenes Virnig/Extender: Geralyn Corwin Ngetich, Dinah Weeks in Treatment: 0 Visit Information Patient Arrived: Danella Maiers Time: 09:48 Accompanied By: daughter Transfer Assistance: None Patient Identification Verified: Yes Secondary Verification Process Completed: Yes Patient Requires Transmission-Based Precautions: No Patient Has Alerts: No Electronic Signature(s) Signed: 08/16/2022 2:53:03 PM By: Thayer Dallas Entered By: Thayer Dallas on 08/16/2022 09:52:35 -------------------------------------------------------------------------------- Clinic Level of Care Assessment Details Patient Name: Date of Service: [name] 08/16/2022 9:30 A M Medical Record Number: [id number] Patient Account Number: [id number] Date of  Birth/Sex: Treating RN: [birth year] (77 y.o. Arta Silence Primary Care Naama Sappington: Jacalyn Lefevre Other Clinician: Referring Oneika Simonian: Treating Trumaine Wimer/Extender: Brindley, Madarang ([id number]) [id number] Page 2 of 11 Weeks in Treatment: 0 Clinic Level of Care Assessment Items TOOL 2 Quantity Score X- 1 0 Use when only an EandM is performed on the INITIAL visit ASSESSMENTS - Nursing Assessment / Reassessment X- 1 20 General Physical Exam (combine w/ comprehensive assessment (listed just below) when performed on new pt. evals) X- 1 25 Comprehensive Assessment (HX, ROS, Risk Assessments, Wounds Hx, etc.) ASSESSMENTS - Wound and Skin A ssessment / Reassessment []  - 0 Simple Wound Assessment / Reassessment - one wound X- 2 5 Complex Wound Assessment / Reassessment - multiple wounds X- 1 10 Dermatologic / Skin Assessment (not related to wound area) ASSESSMENTS - Ostomy and/or Continence Assessment and Care []  - 0 Incontinence Assessment and Management []  - 0 Ostomy Care Assessment and Management (repouching, etc.) PROCESS - Coordination of Care []  - 0 Simple Patient / Family Education for ongoing care X- 1 20 Complex (extensive) Patient / Family Education for ongoing care X- 1 10 Staff obtains Chiropractor, Records, T Results / Process Orders est X- 1 10 Staff telephones HHA, Nursing Homes / Clarify orders / etc []  - 0 Routine Transfer to another Facility (non-emergent condition) []  - 0 Routine Hospital Admission (non-emergent condition) X- 1 15 New Admissions / Manufacturing engineer / Ordering NPWT Apligraf, etc. , []  - 0 Emergency Hospital Admission (emergent condition) []  - 0 Simple Discharge Coordination X- 1 15 Complex (extensive) Discharge Coordination PROCESS - Special Needs []  - 0 Pediatric / Minor Patient Management []  - 0 Isolation Patient Management []  - 0 Hearing / Language / Visual special  needs []  - 0 Assessment of Community assistance (transportation, D/[place] planning, etc.) []  - 0 Additional assistance / Altered mentation []  - 0 Support Surface(s) Assessment (bed, cushion, seat, etc.) INTERVENTIONS - Wound Cleansing / Measurement X- 1 5 Wound Imaging (photographs - any number of wounds) []  - 0 Wound Tracing (instead of photographs) []  - 0 Simple  Wound Measurement - one wound X- 2 5 Complex Wound Measurement - multiple wounds []  - 0 Simple Wound Cleansing - one wound X- 2 5 Complex Wound Cleansing - multiple wounds INTERVENTIONS - Wound Dressings X - Small Wound Dressing one or multiple wounds 2 10 []  - 0 Medium Wound Dressing one or multiple wounds []  - 0 Large Wound Dressing one or multiple wounds []  - 0 Application of Medications - injection INTERVENTIONS - Miscellaneous []  - 0 External ear exam BRYER, GOTTSCH ([id number]) [id number] Page 3 of 11 []  - 0 Specimen Collection (cultures, biopsies, blood, body fluids, etc.) []  - 0 Specimen(s) / Culture(s) sent or taken to Lab for analysis []  - 0 Patient Transfer (multiple staff / Michiel Sites Lift / Similar devices) []  - 0 Simple Staple / Suture removal (25 or less) []  - 0 Complex Staple / Suture removal (26 or more) []  - 0 Hypo / Hyperglycemic Management (close monitor of Blood Glucose) X- 1 15 Ankle / Brachial Index (ABI) - do not check if billed separately Has the patient been seen at the hospital within the last three years: Yes Total Score: 195 Level Of Care: New/Established - Level 5 Electronic Signature(s) Signed: 08/16/2022 4:05:57 PM By: Shawn Stall RN, BSN Entered By: Shawn Stall on 08/16/2022 11:05:09 -------------------------------------------------------------------------------- Encounter Discharge Information Details Patient Name: Date of Service: Kayla Dillon BETH Conway. 08/16/2022 9:30 A M Medical Record Number: [id number] Patient Account Number: [id number] Date  of Birth/Sex: Treating RN: [birth year] (77 y.o. Arta Silence Primary Care Tywan Siever: Jacalyn Lefevre Other Clinician: Referring Thayer Inabinet: Treating Rynell Ciotti/Extender: Geralyn Corwin Ngetich, Dinah Weeks in Treatment: 0 Encounter Discharge Information Items Discharge Condition: Stable Ambulatory Status: Cane Discharge Destination: Home Transportation: Private Auto Accompanied By: daughter Schedule Follow-up Appointment: Yes Clinical Summary of Care: Electronic Signature(s) Signed: 08/16/2022 4:05:57 PM By: Shawn Stall RN, BSN Entered By: Shawn Stall on 08/16/2022 11:05:35 -------------------------------------------------------------------------------- Lower Extremity Assessment Details Patient Name: Date of Service: Kayla July Conway. 08/16/2022 9:30 A M Medical Record Number: [id number] Patient Account Number: [id number] Date of Birth/Sex: Treating RN: [birth year] (77 y.o. F) Primary Care Grove Defina: Jacalyn Lefevre Other Clinician: Referring Jourden Gilson: Treating Brette Cast/Extender: Geralyn Corwin Ngetich, Dinah Weeks in Treatment: 0 Edema Assessment Assessed: [Left: No] [Right: No] [Left: Edema] [Right: :] Calf Left: Right: Point of Measurement: 33.5 cm From Medial Instep Ankle Left: Right: Point of Measurement: 9.5 cm From Medial Instep Knee To Floor Left: Right: MANDE, AUVIL ([id number]) [id number] Page 4 of 11 From Medial Instep 44 cm Vascular Assessment Pulses: Dorsalis Pedis Palpable: [Left:Yes] Doppler Audible: [Left:Yes] Posterior Tibial Palpable: [Left:Yes] Doppler Audible: [Left:Yes] Blood Pressure: Brachial: [Left:126] Ankle: [Left:Dorsalis Pedis: 124 0.98] Electronic Signature(s) Signed: 08/16/2022 4:05:57 PM By: Shawn Stall RN, BSN Entered By: Shawn Stall on 08/16/2022 10:32:36 -------------------------------------------------------------------------------- Multi Wound Chart Details Patient Name: Date of  Service: Kayla Dillon BETH Conway. 08/16/2022 9:30 A M Medical Record Number: [id number] Patient Account Number: [id number] Date of Birth/Sex: Treating RN: 07/03/1945 (77 y.o. F) Primary Care Cain Fitzhenry: Jacalyn Lefevre Other Clinician: Referring Airon Sahni: Treating Marcelino Campos/Extender: Geralyn Corwin Ngetich, Dinah Weeks in Treatment: 0 Vital Signs Height(in): 63 Capillary Blood Glucose(mg/dl): 536 Weight(lbs): 644 Pulse(bpm): 75 Body Mass Index(BMI): 21.3 Blood Pressure(mmHg): 126/59 Temperature(F): 97.8 Respiratory Rate(breaths/min): 18 [1:Photos:] [N/A:N/A] Left, Posterior Lower Leg Right Gluteus N/A Wound Location: Bump Shear/Friction N/A Wounding Event: Diabetic Wound/Ulcer of the Lower Pressure Ulcer N/A Primary Etiology: Extremity Cellulitis N/A N/A Secondary Etiology: Chronic sinus problems/congestion, Chronic sinus problems/congestion, N/A  Comorbid History: Arrhythmia, Congestive Heart Failure, Arrhythmia, Congestive Heart Failure, Coronary Artery Disease, Coronary Artery Disease, Hypertension, Cirrhosis , Type II Hypertension, Cirrhosis , Type II Diabetes, Osteoarthritis, Neuropathy, Diabetes, Osteoarthritis, Neuropathy, Confinement Anxiety Confinement Anxiety 05/09/2022 08/07/2021 N/A Date Acquired: 0 0 N/A Weeks of Treatment: Open Open N/A Wound Status: No No N/A Wound Recurrence: No Yes N/A Clustered Wound: N/A 2 N/A Clustered Quantity: 1x1.5x1 2x1x0.1 N/A Measurements L x W x D (cm) 1.178 1.571 N/A A (cm) : rea 1.178 0.157 N/A Volume (cm) : 12 Position 1 (o'clock): 3.5 Maximum Distance 1 (cm): Yes No N/A Tunneling: Grade 2 Category/Stage II N/A Classification: Medium Small N/A Exudate A mount: Serosanguineous Serosanguineous N/A Exudate TypeNAIYANA, TIBBETT ([id number]) [id number] Page 5 of 11 red, brown red, brown N/A Exudate Color: Distinct, outline attached Distinct, outline attached N/A Wound Margin: Large  (67-100%) Large (67-100%) N/A Granulation Amount: N/A Pink, Pale N/A Granulation Quality: None Present (0%) None Present (0%) N/A Necrotic Amount: Tendon: Yes Fat Layer (Subcutaneous Tissue): Yes N/A Exposed Structures: Fascia: No Fascia: No Fat Layer (Subcutaneous Tissue): No Tendon: No Muscle: No Muscle: No Joint: No Joint: No Bone: No Bone: No None Large (67-100%) N/A Epithelialization: Excoriation: No Excoriation: No N/A Periwound Skin Texture: Induration: No Induration: No Callus: No Callus: No Crepitus: No Crepitus: No Rash: No Rash: No Scarring: No Scarring: No Maceration: No Dry/Scaly: Yes N/A Periwound Skin Moisture: Dry/Scaly: No Maceration: No Atrophie Blanche: No Atrophie Blanche: No N/A Periwound Skin Color: Cyanosis: No Cyanosis: No Ecchymosis: No Ecchymosis: No Erythema: No Erythema: No Hemosiderin Staining: No Hemosiderin Staining: No Mottled: No Mottled: No Pallor: No Pallor: No Rubor: No Rubor: No Treatment Notes Electronic Signature(s) Signed: 08/16/2022 12:40:48 PM By: Geralyn Corwin DO Entered By: Geralyn Corwin on 08/16/2022 11:01:16 -------------------------------------------------------------------------------- Multi-Disciplinary Care Plan Details Patient Name: Date of Service: Kayla July Conway. 08/16/2022 9:30 A M Medical Record Number: [id number] Patient Account Number: [id number] Date of Birth/Sex: Treating RN: 10/19/1945 (77 y.o. Debara Pickett, Yvonne Kendall Primary Care Kishana Battey: Jacalyn Lefevre Other Clinician: Referring Analaya Hoey: Treating Romond Pipkins/Extender: Geralyn Corwin Ngetich, Dinah Weeks in Treatment: 0 Active Inactive Orientation to the Wound Care Program Nursing Diagnoses: Knowledge deficit related to the wound healing center program Goals: Patient/caregiver will verbalize understanding of the Wound Healing Center Program Date Initiated: 08/16/2022 Target Resolution Date: 09/16/2022 Goal Status:  Active Interventions: Provide education on orientation to the wound center Notes: Pain, Acute or Chronic Nursing Diagnoses: Pain, acute or chronic: actual or potential Potential alteration in comfort, pain Goals: Patient will verbalize adequate pain control and receive pain control interventions during procedures as needed Date Initiated: 08/16/2022 Target Resolution Date: 09/16/2022 Goal Status: Active Patient/caregiver will verbalize comfort level met Date Initiated: 08/16/2022 Target Resolution Date: 09/16/2022 Goal Status: Active SHONE, GRENNELL ([id number]) [id number] Page 6 of 11 Interventions: Encourage patient to take pain medications as prescribed Provide education on pain management Treatment Activities: Administer pain control measures as ordered : 08/16/2022 Notes: Pressure Nursing Diagnoses: Potential for impaired tissue integrity related to pressure, friction, moisture, and shear Goals: Patient will remain free of pressure ulcers Date Initiated: 08/16/2022 Target Resolution Date: 09/16/2022 Goal Status: Active Patient/caregiver will verbalize understanding of pressure ulcer management Date Initiated: 08/16/2022 Target Resolution Date: 09/15/2022 Goal Status: Active Interventions: Assess: immobility, friction, shearing, incontinence upon admission and as needed Assess offloading mechanisms upon admission and as needed Provide education on pressure ulcers Notes: Wound/Skin Impairment Nursing Diagnoses: Knowledge deficit related to ulceration/compromised skin integrity Goals: Patient/caregiver will verbalize  understanding of skin care regimen Date Initiated: 08/16/2022 Target Resolution Date: 09/15/2022 Goal Status: Active Ulcer/skin breakdown will heal within 14 weeks Date Initiated: 08/16/2022 Target Resolution Date: 11/12/2022 Goal Status: Active Interventions: Assess patient/caregiver ability to perform ulcer/skin care regimen upon admission  and as needed Assess ulceration(s) every visit Provide education on ulcer and skin care Treatment Activities: Skin care regimen initiated : 08/16/2022 Topical wound management initiated : 08/16/2022 Notes: Electronic Signature(s) Signed: 08/16/2022 4:05:57 PM By: Shawn Stalleaton, Bobbi RN, BSN Entered By: Shawn Stalleaton, [name] on 08/16/2022 11:03:29 -------------------------------------------------------------------------------- Pain Assessment Details Patient Name: Date of Service: Kayla DillonV A Conway, Kayla BETH Conway. 08/16/2022 9:30 A M Medical Record Number: [id number] Patient Account Number: [id number] Date of Birth/Sex: Treating RN: 08-29-1945 (77 y.o. Arta SilenceF) [name], [name] Primary Care Cerrone Debold: Jacalyn LefevreMiller , Stephen Other Clinician: Referring Mickle Campton: Treating Via Rosado/Extender: Geralyn CorwinHoffman, Jessica Ngetich, Dinah Weeks in Treatment: 0 Active Problems Location of Pain Severity and Description of Pain Patient Has Paino Yes Site Locations Pain LocationOllen Bowl: [place], Cyrilla Conway ([id number]) [id number] Page 7 of 11 Pain Location: Pain in Ulcers Rate the pain. Current Pain Level: 7 Pain Management and Medication Current Pain Management: Medication: No Cold Application: No Rest: No Massage: No Activity: No T.E.N.S.: No Heat Application: No Leg drop or elevation: No Is the Current Pain Management Adequate: Adequate How does your wound impact your activities of daily livingo Sleep: No Bathing: No Appetite: No Relationship With Others: No Bladder Continence: No Emotions: No Bowel Continence: No Work: No Toileting: No Drive: No Dressing: No Hobbies: No Psychologist, prison and probation serviceslectronic Signature(s) Signed: 08/16/2022 4:05:57 PM By: Shawn Stalleaton, Bobbi RN, BSN Entered By: Shawn Stalleaton, [name] on 08/16/2022 10:35:03 -------------------------------------------------------------------------------- Patient/Caregiver Education Details Patient Name: Date of Service: Kayla MiresV A Conway, Kayla BETH Conway. 4/9/2024andnbsp9:30 A M Medical Record  Number: [id number] Patient Account Number: [id number] Date of Birth/Gender: Treating RN: 08-29-1945 (77 y.o. Debara PickettF) [name], Yvonne KendallBobbi Primary Care Physician: Jacalyn LefevreMiller , Stephen Other Clinician: Referring Physician: Treating Physician/Extender: Jae DireHoffman, Jessica Ngetich, Dinah Weeks in Treatment: 0 Education Assessment Education Provided To: Patient and Caregiver Education Topics Provided Welcome T [facility]-New Patient Packet: o Handouts: The Wound Healing Pledge form, Welcome T [facility] o Methods: Explain/Verbal Responses: Reinforcements needed Wound/Skin Impairment: Handouts: Caring for Your Ulcer Methods: Explain/Verbal Responses: Reinforcements needed Electronic Signature(s) Signed: 08/16/2022 4:05:57 PM By: Shawn Stalleaton, Bobbi RN, BSN Entered By: Shawn Stalleaton, [name] on 08/16/2022 11:03:47 [name], [name] ([id number]) [id number] Page 8 of 11 -------------------------------------------------------------------------------- Wound Assessment Details Patient Name: Date of Service: Kayla MiresV A Conway, Kayla BETH Conway. 08/16/2022 9:30 A M Medical Record Number: [id number] Patient Account Number: [id number] Date of Birth/Sex: Treating RN: 08-29-1945 (77 y.o. F) Primary Care Shameika Speelman: Jacalyn LefevreMiller , Stephen Other Clinician: Referring Ayslin Kundert: Treating Livy Ross/Extender: Geralyn CorwinHoffman, Jessica Ngetich, Dinah Weeks in Treatment: 0 Wound Status Wound Number: 1 Primary Diabetic Wound/Ulcer of the Lower Extremity Etiology: Wound Location: Left, Posterior Lower Leg Secondary Cellulitis Wounding Event: Bump Etiology: Date Acquired: 05/09/2022 Wound Open Weeks Of Treatment: 0 Status: Clustered Wound: No Comorbid Chronic sinus problems/congestion, Arrhythmia, Congestive Heart History: Failure, Coronary Artery Disease, Hypertension, Cirrhosis , Type II Diabetes, Osteoarthritis, Neuropathy, Confinement Anxiety Photos Wound Measurements Length: (cm) 1 Width: (cm) 1.5 Depth: (cm)  1 Area: (cm) 1.178 Volume: (cm) 1.178 % Reduction in Area: % Reduction in Volume: Epithelialization: None Tunneling: Yes Position (o'clock): 12 Maximum Distance: (cm) 3.5 Undermining: No Wound Description Classification: Grade 2 Wound Margin: Distinct, outline attached Exudate Amount: Medium Exudate Type: Serosanguineous Exudate Color: red, brown Foul Odor After Cleansing: No Slough/Fibrino No Wound Bed Granulation  Amount: Large (67-100%) Exposed Structure Necrotic Amount: None Present (0%) Fascia Exposed: No Fat Layer (Subcutaneous Tissue) Exposed: No Tendon Exposed: Yes Muscle Exposed: No Joint Exposed: No Bone Exposed: No Periwound Skin Texture Texture Color No Abnormalities Noted: No No Abnormalities Noted: No Callus: No Atrophie Blanche: No Crepitus: No Cyanosis: No Excoriation: No Ecchymosis: No Induration: No Erythema: No Rash: No Hemosiderin Staining: No Scarring: No Mottled: No Pallor: No Moisture Rubor: No No Abnormalities Noted: No Dry / Scaly: No [name], [name] ([id number]) [id number] Page 9 of 11 Maceration: No Treatment Notes Wound #1 (Lower Leg) Wound Laterality: Left, Posterior Cleanser Wound Cleanser Discharge Instruction: Cleanse the wound with wound cleanser prior to applying a clean dressing using gauze sponges, not tissue or cotton balls. Peri-Wound Care Skin Prep Discharge Instruction: Use skin prep as directed Topical Primary Dressing Maxorb Extra Ag+ Alginate Dressing, 4x4.75 (in/in) Discharge Instruction: Lightly pack into tunnel, apply to wound bed, and leave a tail out to remove. Secondary Dressing Zetuvit Plus Silicone Border Dressing 4x4 (in/in) Discharge Instruction: Apply silicone border over primary dressing as directed. Secured With Compression Wrap Compression Stockings Facilities manager) Signed: 08/16/2022 4:05:57 PM By: Shawn Stall RN, BSN Entered By: Shawn Stall  on 08/16/2022 10:38:23 -------------------------------------------------------------------------------- Wound Assessment Details Patient Name: Date of Service: Kayla July Conway. 08/16/2022 9:30 A M Medical Record Number: [id number] Patient Account Number: [id number] Date of Birth/Sex: Treating RN: 25-Oct-1945 (77 y.o. F) Primary Care Broc Caspers: Jacalyn Lefevre Other Clinician: Referring Trinaty Bundrick: Treating Alsha Meland/Extender: Geralyn Corwin Ngetich, Dinah Weeks in Treatment: 0 Wound Status Wound Number: 2 Primary Pressure Ulcer Etiology: Wound Location: Right Gluteus Wound Open Wounding Event: Shear/Friction Status: Date Acquired: 08/07/2021 Comorbid Chronic sinus problems/congestion, Arrhythmia, Congestive Heart Weeks Of Treatment: 0 History: Failure, Coronary Artery Disease, Hypertension, Cirrhosis , Type II Clustered Wound: Yes Diabetes, Osteoarthritis, Neuropathy, Confinement Anxiety Photos Wound Measurements Length: (cm) 2 Width: (cm) 1 Depth: (cm) 0.1 BURNETTE, VALENTI Conway ([id number]) Clustered Quantity: 2 Area: (cm) 1.571 Volume: (cm) 0.157 % Reduction in Area: % Reduction in Volume: Epithelialization: Large (67-100%) [id number] Page 10 of 11 Tunneling: No Undermining: No Wound Description Classification: Category/Stage II Wound Margin: Distinct, outline attached Exudate Amount: Small Exudate Type: Serosanguineous Exudate Color: red, brown Foul Odor After Cleansing: No Slough/Fibrino No Wound Bed Granulation Amount: Large (67-100%) Exposed Structure Granulation Quality: Pink, Pale Fascia Exposed: No Necrotic Amount: None Present (0%) Fat Layer (Subcutaneous Tissue) Exposed: Yes Tendon Exposed: No Muscle Exposed: No Joint Exposed: No Bone Exposed: No Periwound Skin Texture Texture Color No Abnormalities Noted: No No Abnormalities Noted: No Callus: No Atrophie Blanche: No Crepitus: No Cyanosis: No Excoriation:  No Ecchymosis: No Induration: No Erythema: No Rash: No Hemosiderin Staining: No Scarring: No Mottled: No Pallor: No Moisture Rubor: No No Abnormalities Noted: No Dry / Scaly: Yes Maceration: No Treatment Notes Wound #2 (Gluteus) Wound Laterality: Right Cleanser Soap and Water Discharge Instruction: May shower and wash wound with dial antibacterial soap and water prior to dressing change. Peri-Wound Care Skin Prep Discharge Instruction: Use skin prep as directed Topical Primary Dressing Maxorb Extra Ag+ Alginate Dressing, 2x2 (in/in) Discharge Instruction: Apply to wound bed as instructed Secondary Dressing Zetuvit Plus Silicone Border Dressing 4x4 (in/in) Discharge Instruction: Apply silicone border over primary dressing as directed. Secured With Compression Wrap Compression Stockings Facilities manager) Signed: 08/16/2022 4:05:57 PM By: Shawn Stall RN, BSN Entered By: Shawn Stall on 08/16/2022 10:30:11 -------------------------------------------------------------------------------- Vitals Details Patient Name: Date of Service: [name], Kayla BETH Conway.  08/16/2022 9:30 A M Medical Record Number: [id number] Patient Account Number: [id number] KHRISTAL, BAYDOUN ([id number]) [phone].pdf Page 11 of 11 Date of Birth/Sex: Treating RN: 1945-10-02 (77 y.o. F) Primary Care Neytiri Asche: Jacalyn Lefevre Other Clinician: Referring Jyaire Koudelka: Treating Kele Barthelemy/Extender: Geralyn Corwin Ngetich, Dinah Weeks in Treatment: 0 Vital Signs Time Taken: 09:52 Temperature (F): 97.8 Height (in): 63 Pulse (bpm): 75 Source: Stated Respiratory Rate (breaths/min): 18 Weight (lbs): 120 Blood Pressure (mmHg): 126/59 Source: Stated Capillary Blood Glucose (mg/dl): 086 Body Mass Index (BMI): 21.3 Reference Range: 80 - 120 mg / dl Electronic Signature(s) Signed: 08/16/2022 2:53:03 PM By: Thayer Dallas Entered By: Thayer Dallas on 08/16/2022 09:54:40

## 2022-08-16 NOTE — Progress Notes (Signed)
Kayla, Conway ([id number]) [phone].pdf Page 1 of 4 Visit Report for 08/16/2022 Abuse Risk Screen Details Patient Name: Date of Service: [name] 08/16/2022 9:30 A M Medical Record Number: [id number] Patient Account Number: [id number] Date of Birth/Sex: Treating RN: 1945/11/19 (77 y.o. F) Primary Care Komal Stangelo: Jacalyn Lefevre Other Clinician: Referring Kamaree Berkel: Treating Felicia Bloomquist/Extender: Geralyn Corwin Ngetich, Dinah Weeks in Treatment: 0 Abuse Risk Screen Items Answer ABUSE RISK SCREEN: Has anyone close to you tried to hurt or harm you recentlyo No Do you feel uncomfortable with anyone in your familyo No Has anyone forced you do things that you didnt want to doo No Electronic Signature(s) Signed: 08/16/2022 2:53:03 PM By: Thayer Dallas Entered By: Thayer Dallas on 08/16/2022 10:02:21 -------------------------------------------------------------------------------- Activities of Daily Living Details Patient Name: Date of Service: Kayla July C. 08/16/2022 9:30 A M Medical Record Number: [id number] Patient Account Number: [id number] Date of Birth/Sex: Treating RN: 08-23-1945 (77 y.o. F) Primary Care Orey Moure: Jacalyn Lefevre Other Clinician: Referring Piera Downs: Treating Ira Busbin/Extender: Geralyn Corwin Ngetich, Dinah Weeks in Treatment: 0 Activities of Daily Living Items Answer Activities of Daily Living (Please select one for each item) Drive Automobile Not Able T Medications ake Completely Able Use T elephone Completely Able Care for Appearance Completely Able Use T oilet Completely Able Bath / Shower Completely Able Dress Self Completely Able Feed Self Completely Able Walk Completely Able Get In / Out Bed Completely Able Housework Completely Able Prepare Meals Completely Able Handle Money Completely Able Shop for Self Completely Able Electronic Signature(s) Signed: 08/16/2022 2:53:03 PM By: Thayer Dallas Entered By: Thayer Dallas on 08/16/2022 10:03:05 -------------------------------------------------------------------------------- Education Screening Details Patient Name: Date of Service: Kayla July C. 08/16/2022 9:30 A M Medical Record Number: [id number] Patient Account Number: [id number] Date of Birth/Sex: Treating RN: 03-01-1946 (77 y.o. F) Primary Care Anvitha Hutmacher: Jacalyn Lefevre Other Clinician: Referring Smita Lesh: Treating Avilyn Virtue/Extender: Geralyn Corwin Ngetich, Dinah Weeks in Treatment: 0 JADA, MAYABB ([id number]) [phone].pdf Page 2 of 4 Learning Preferences/Education Level/Primary Language Learning Preference: Explanation, Demonstration, Printed Material Highest Education Level: High School Preferred Language: Economist Language Barrier: No Translator Needed: No Memory Deficit: No Emotional Barrier: No Cultural/Religious Beliefs Affecting Medical Care: No Physical Barrier Impaired Vision: Yes Glasses, reader Impaired Hearing: No Decreased Hand dexterity: No Knowledge/Comprehension Knowledge Level: High Comprehension Level: High Ability to understand written instructions: High Ability to understand verbal instructions: High Motivation Anxiety Level: Calm Cooperation: Cooperative Education Importance: Acknowledges Need Interest in Health Problems: Asks Questions Perception: Coherent Willingness to Engage in Self-Management High Activities: Readiness to Engage in Self-Management High Activities: Electronic Signature(s) Signed: 08/16/2022 2:53:03 PM By: Thayer Dallas Entered By: Thayer Dallas on 08/16/2022 10:03:53 -------------------------------------------------------------------------------- Fall Risk Assessment Details Patient Name: Date of Service: Kayla Dillon BETH C. 08/16/2022 9:30 A M Medical Record Number: [id number] Patient Account Number: [id number] Date of Birth/Sex: Treating  RN: Nov 26, 1945 (77 y.o. F) Primary Care Sayre Witherington: Jacalyn Lefevre Other Clinician: Referring Royanne Warshaw: Treating Lonni Dirden/Extender: Geralyn Corwin Ngetich, Dinah Weeks in Treatment: 0 Fall Risk Assessment Items Have you had 2 or more falls in the last 12 monthso 0 Yes Have you had any fall that resulted in injury in the last 12 monthso 0 Yes FALLS RISK SCREEN History of falling - immediate or within 3 months 25 Yes Secondary diagnosis (Do you have 2 or more medical diagnoseso) 0 No Ambulatory aid None/bed rest/wheelchair/nurse 0 No Crutches/cane/walker 15 Yes Furniture 0 No Intravenous  therapy Access/Saline/Heparin Lock 0 No Gait/Transferring Normal/ bed rest/ wheelchair 0 No Weak (short steps with or without shuffle, stooped but able to lift head while walking, may seek 10 Yes support from furniture) Impaired (short steps with shuffle, may have difficulty arising from chair, head down, impaired 0 No balance) Mental Status Oriented to own ability 0 Yes Overestimates or forgets limitations 0 No Risk Level: Medium Risk Score: 50 EVELIA, TOLLIS C ([id number]) [id number] Page 3 of 4 Electronic Signature(s) -------------------------------------------------------------------------------- Foot Assessment Details Patient Name: Date of Service: [name] 08/16/2022 9:30 A M Medical Record Number: [id number] Patient Account Number: [id number] Date of Birth/Sex: Treating RN: 26-Oct-1945 (77 y.o. Arta Silence Primary Care Samone Guhl: Jacalyn Lefevre Other Clinician: Referring Shakoya Gilmore: Treating Ian Castagna/Extender: Geralyn Corwin Ngetich, Dinah Weeks in Treatment: 0 Foot Assessment Items Site Locations + = Sensation present, - = Sensation absent, C = Callus, U = Ulcer R = Redness, W = Warmth, M = Maceration, PU = Pre-ulcerative lesion F = Fissure, S = Swelling, D = Dryness Assessment Right: Left: Other Deformity: No No Prior Foot  Ulcer: No No Prior Amputation: No No Charcot Joint: No No Ambulatory Status: Ambulatory Without Help Gait: Steady Electronic Signature(s) Signed: 08/16/2022 4:05:57 PM By: Shawn Stall RN, BSN Entered By: Shawn Stall on 08/16/2022 10:36:32 -------------------------------------------------------------------------------- Nutrition Risk Screening Details Patient Name: Date of Service: Kayla Dillon BETH C. 08/16/2022 9:30 A M Medical Record Number: [id number] Patient Account Number: [id number] Date of Birth/Sex: Treating RN: 1946-02-03 (77 y.o. F) Primary Care Glenyce Randle: Jacalyn Lefevre Other Clinician: Referring Rami Budhu: Treating Kanden Carey/Extender: Geralyn Corwin Ngetich, Dinah Weeks in Treatment: 0 Height (in): 63 Weight (lbs): 120 Body Mass Index (BMI): 21.3 MABELLE, GLUCKSMAN C ([id number]) [id number] Page 4 of 4 Nutrition Risk Screening Items Score Screening NUTRITION RISK SCREEN: I have an illness or condition that made me change the kind and/or amount of food I eat 2 Yes I eat fewer than two meals per day 0 No I eat few fruits and vegetables, or milk products 0 No I have three or more drinks of beer, liquor or wine almost every day 0 No I have tooth or mouth problems that make it hard for me to eat 0 No I don't always have enough money to buy the food I need 0 No I eat alone most of the time 0 No I take three or more different prescribed or over-the-counter drugs a day 0 No Without wanting to, I have lost or gained 10 pounds in the last six months 2 Yes I am not always physically able to shop, cook and/or feed myself 0 No Nutrition Protocols Good Risk Protocol Moderate Risk Protocol 0 Provide education on nutrition High Risk Proctocol Risk Level: Moderate Risk Score: 4 Electronic Signature(s) Signed: 08/16/2022 2:53:03 PM By: Thayer Dallas Entered By: Thayer Dallas on 08/16/2022 10:04:58

## 2022-08-17 ENCOUNTER — Ambulatory Visit: Payer: Medicare Other | Admitting: Family Medicine

## 2022-08-17 ENCOUNTER — Telehealth: Payer: Self-pay

## 2022-08-17 NOTE — Telephone Encounter (Signed)
Vikki Ports with [facility] called to notify PCP that patient is on a twice weekly regimen and she refused appointment today. Patients next appointment is pending for Friday 08/19/22.  FYI

## 2022-08-18 ENCOUNTER — Encounter: Payer: Medicare Other | Admitting: Family

## 2022-08-18 DIAGNOSIS — N1831 Chronic kidney disease, stage 3a: Secondary | ICD-10-CM | POA: Diagnosis not present

## 2022-08-18 DIAGNOSIS — I5033 Acute on chronic diastolic (congestive) heart failure: Secondary | ICD-10-CM | POA: Diagnosis not present

## 2022-08-18 DIAGNOSIS — E1122 Type 2 diabetes mellitus with diabetic chronic kidney disease: Secondary | ICD-10-CM | POA: Diagnosis not present

## 2022-08-18 DIAGNOSIS — I130 Hypertensive heart and chronic kidney disease with heart failure and stage 1 through stage 4 chronic kidney disease, or unspecified chronic kidney disease: Secondary | ICD-10-CM | POA: Diagnosis not present

## 2022-08-18 DIAGNOSIS — L89893 Pressure ulcer of other site, stage 3: Secondary | ICD-10-CM | POA: Diagnosis not present

## 2022-08-18 DIAGNOSIS — L03116 Cellulitis of left lower limb: Secondary | ICD-10-CM | POA: Diagnosis not present

## 2022-08-18 NOTE — Progress Notes (Signed)
STEFFI, NOVIELLO ([id number]) [id number] Page 1 of 9 Visit Report for 08/16/2022 Chief Complaint Document Details Patient Name: Date of Service: [name] 08/16/2022 9:30 A M Medical Record Number: [id number] Patient Account Number: [id number] Date of Birth/Sex: Treating RN: 12-Nov-1945 (77 y.o. F) Primary Care Provider: Jacalyn Lefevre Other Clinician: Referring Provider: Treating Provider/Extender: Geralyn Corwin Ngetich, Dinah Weeks in Treatment: 0 Information Obtained from: Patient Chief Complaint 08/16/2022; left posterior leg wound and skin breakdown to the right gluteus Electronic Signature(s) Signed: 08/16/2022 12:40:48 PM By: Geralyn Corwin DO Entered By: Geralyn Corwin on 08/16/2022 11:01:23 -------------------------------------------------------------------------------- HPI Details Patient Name: Date of Service: Kayla July C. 08/16/2022 9:30 A M Medical Record Number: [id number] Patient Account Number: [id number] Date of Birth/Sex: Treating RN: 1945/12/14 (77 y.o. F) Primary Care Provider: Jacalyn Lefevre Other Clinician: Referring Provider: Treating Provider/Extender: Geralyn Corwin Ngetich, Dinah Weeks in Treatment: 0 History of Present Illness HPI Description: 08/16/2022 Ms. Emmalena Canny is a 77 year old female with a past medical history of cirrhosis of the liver, uncontrolled insulin-dependent type 2 diabetes, severe protein malnutrition that presents the clinic for a left posterior leg wound and skin breakdown to the right buttocks. Daughter is present and helps provide the history. She states that she developed cellulitis to the left leg and subsequently developed a wound. She states this has been present for the past 3 months. She has been treated in the ED for this issue with antibiotics. Currently she is using silver alginate to both wound sites. Daughter states that the patient has been dealing with skin  breakdown to the buttocks areas over the past year. They wax and wane in healing. Patient is mostly sedentary. She currently denies signs of infection. Electronic Signature(s) Signed: 08/16/2022 12:40:48 PM By: Geralyn Corwin DO Entered By: Geralyn Corwin on 08/16/2022 11:02:44 -------------------------------------------------------------------------------- Physical Exam Details Patient Name: Date of Service: Kayla July C. 08/16/2022 9:30 A M Medical Record Number: [id number] Patient Account Number: [id number] Date of Birth/Sex: Treating RN: [birth year] (77 y.o. F) Primary Care Provider: Jacalyn Lefevre Other Clinician: Referring Provider: Treating Provider/Extender: Geralyn Corwin Ngetich, Dinah Weeks in Treatment: 0 Constitutional respirations regular, non-labored and within target range for patient.. Cardiovascular 2+ dorsalis pedis/posterior tibialis pulses. Psychiatric pleasant and cooperative. SENYA, HINZMAN ([id number]) [id number] Page 2 of 9 Notes T the posterior left leg there is a circular wound that tunnels. Small tendon exposed. No signs of surrounding infection including increased warmth, erythema or o purulent drainage. T the right buttocks there is an area of skin breakdown. o Electronic Signature(s) Signed: 08/16/2022 12:40:48 PM By: Geralyn Corwin DO Entered By: Geralyn Corwin on 08/16/2022 11:03:15 -------------------------------------------------------------------------------- Physician Orders Details Patient Name: Date of Service: Kayla July C. 08/16/2022 9:30 A M Medical Record Number: [id number] Patient Account Number: [id number] Date of Birth/Sex: Treating RN: [birth year] (77 y.o. 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 - Dr. Mikey Bussing 08/30/2022 Tuesday 1245pm room 8 Return A Anesthetic (In clinic) Topical Lidocaine 4% applied to wound  bed Bathing/ Shower/ Hygiene May shower with protection but do not get wound dressing(s) wet. Protect dressing(s) with water repellant cover (for example, large plastic bag) or a cast cover and may then take shower. Edema Control - Lymphedema / SCD / Other Elevate legs to the level of the heart or above for 30 minutes daily and/or when sitting for 3-4 times a day throughout the day. Avoid standing for long periods of time. Patient to wear own compression stockings every day. - apply in the morning and remove at night. Exercise regularly Moisturize legs daily. Off-Loading Gel mattress overlay (Group 1) - Medical modalities group one mattress for patient. Home Health New wound care orders this week; continue Home Health for wound care. May utilize formulary equivalent dressing for wound treatment orders unless otherwise specified. - home health to twice a week and family to change every other day. continue to pack left leg wound with alginate Ag, cover with a border foam and apply compression stocking. Buttock change daily with alginate Ag and bordered foam- family to change all other days home health does not. Other Home Health Orders/Instructions: - [facility] Wound Treatment Wound #1 - Lower Leg Wound Laterality: Left, Posterior Cleanser: Wound Cleanser (Home Health) Every Other Day/30 Days Discharge Instructions: Cleanse the wound with wound cleanser prior to applying a clean  dressing using gauze sponges, not tissue or cotton balls. Peri-Wound Care: Skin Prep [facility] Every Other Day/30 Days Discharge Instructions: Use skin prep as directed Prim Dressing: Maxorb Extra Ag+ Alginate Dressing, 4x4.75 (in/in) (Home Health) Every Other Day/30 Days ary Discharge Instructions: Lightly pack into tunnel, apply to wound bed, and leave a tail out to remove. Secondary Dressing: Zetuvit Plus Silicone Border Dressing 4x4 (in/in) (Home Health) Every Other Day/30 Days Discharge Instructions: Apply silicone border over primary dressing as directed. Wound #2 - Gluteus Wound Laterality: Right Cleanser: Soap and Water (Home Health) 1 x Per Day/30 Days Discharge Instructions: May shower and wash wound with dial antibacterial soap and water prior to dressing change. CIAIRA, KOSHIOL ([id number]) [id number] Page 3 of 9 Peri-Wound Care: Skin Prep (Home Health) 1 x Per Day/30 Days Discharge Instructions: Use skin prep as directed Prim Dressing: Maxorb Extra Ag+ Alginate Dressing, 2x2 (in/in) (Home Health) 1 x Per Day/30 Days ary Discharge Instructions: Apply to wound bed as instructed Secondary Dressing: Zetuvit Plus Silicone Border Dressing 4x4 (in/in) (Home Health) 1 x Per Day/30 Days Discharge Instructions: Apply silicone border over primary dressing as directed. Electronic Signature(s) Signed: 08/16/2022 12:40:48 PM By: Geralyn Corwin DO Signed: 08/16/2022 4:05:57 PM By: Shawn Stall RN, BSN Entered By: Shawn Stall on 08/16/2022 11:04:15 -------------------------------------------------------------------------------- Problem List Details Patient Name: Date of Service: Kayla July C. 08/16/2022 9:30 A M Medical Record Number: [id number] Patient Account Number: [id number] Date of Birth/Sex: Treating RN: 1945/08/18 (77 y.o. F) Primary Care Provider: Jacalyn Lefevre Other Clinician: Referring Provider: Treating Provider/Extender: Geralyn Corwin Ngetich, Dinah Weeks in Treatment: 0 Active Problems ICD-10 Encounter Code Description Active Date MDM Diagnosis L97.825 Non-pressure chronic ulcer of other part of left lower leg with muscle 08/16/2022 No Yes involvement without evidence of necrosis E11.622 Type 2 diabetes mellitus with other skin ulcer 08/16/2022 No Yes L89.312 Pressure ulcer of right buttock, stage 2 08/16/2022 No Yes K74.60 Unspecified cirrhosis of liver 08/16/2022 No Yes E43 Unspecified severe protein-calorie malnutrition 08/16/2022 No Yes Inactive Problems Resolved Problems Electronic Signature(s) Signed: 08/16/2022 12:40:48 PM By: Geralyn Corwin DO Entered By: Geralyn Corwin on  08/16/2022 11:01:09 -------------------------------------------------------------------------------- Progress Note Details Patient Name: Date of Service: [name] 08/16/2022 9:30 A M Medical Record Number: [id number] Patient Account Number: [id number] Date of Birth/Sex: Treating RN: 1945-05-14 (77 y.o. F) Primary Care Provider: Jacalyn Lefevre Other Clinician: Referring Provider: Treating Provider/Extender: Geralyn Corwin Ngetich, Dinah Weeks in Treatment: 0 REILY, TRELOAR ([id number]) [id number] Page 4 of 9 Subjective Chief Complaint Information obtained from Patient 08/16/2022; left posterior leg wound and skin breakdown to the right gluteus History of Present Illness (HPI) 08/16/2022 Ms. [name] is a 77 year old female with a past medical history of cirrhosis of the liver, uncontrolled insulin-dependent type 2 diabetes, severe protein malnutrition that presents the clinic for a left posterior leg wound and skin breakdown to the right buttocks. Daughter is present and helps provide the history. She states that she developed cellulitis to the left leg and subsequently developed a wound. She states this has been present for the past 3 months. She has been treated in the ED for  this issue with antibiotics. Currently she is using silver alginate to both wound sites. Daughter states that the patient has been dealing with skin breakdown to the buttocks areas over the past year. They wax and wane in healing. Patient is mostly sedentary. She currently denies signs of infection. Patient History Information obtained from Patient. Allergies kiwi (Reaction: anaphylaxis), gabapentin, Boostrix Tdap, latex, Statins-HMG-CoA Reductase Inhibitors, tramadol Family History Cancer - Father, Heart Disease - Mother, Hypertension - Mother, Lung Disease - Father, No family history of Diabetes, Hereditary Spherocytosis, Kidney Disease, Seizures, Stroke, Thyroid Problems, Tuberculosis. Social History Never smoker, Marital Status - Married, Alcohol Use - Never, Drug Use - No History, Caffeine Use - Daily. Medical History Ear/Nose/Mouth/Throat Patient has history of Chronic sinus problems/congestion Denies history of Middle ear problems Hematologic/Lymphatic Denies history of Anemia, Hemophilia, Human Immunodeficiency Virus, Lymphedema, Sickle Cell Disease Respiratory Denies history of Aspiration, Asthma, Chronic Obstructive Pulmonary Disease (COPD), Pneumothorax, Sleep Apnea, Tuberculosis Cardiovascular Patient has history of Arrhythmia - Paroxysymal A fib, Congestive Heart Failure, Coronary Artery Disease, Hypertension Denies history of Angina, Deep Vein Thrombosis, Hypotension, Myocardial Infarction, Peripheral Arterial Disease, Peripheral Venous Disease, Phlebitis, Vasculitis Gastrointestinal Patient has history of Cirrhosis - fatty liver Denies history of Colitis, Crohnoos, Hepatitis A, Hepatitis B, Hepatitis C Endocrine Patient has history of Type II Diabetes Denies history of Type I Diabetes Immunological Denies history of Lupus Erythematosus, Raynaudoos, Scleroderma Integumentary (Skin) Denies history of History of Burn Musculoskeletal Patient has history of  Osteoarthritis Denies history of Gout, Rheumatoid Arthritis, Osteomyelitis Neurologic Patient has history of Neuropathy Denies history of Dementia, Quadriplegia, Paraplegia, Seizure Disorder Oncologic Denies history of Received Chemotherapy, Received Radiation Psychiatric Patient has history of Confinement Anxiety Denies history of Anorexia/bulimia Patient is treated with Insulin. Blood sugar is tested. Hospitalization/Surgery History - tips in liver. - open heart surgery. - CVA x 2. Review of Systems (ROS) Constitutional Symptoms (General Health) Complains or has symptoms of Chills. Denies complaints or symptoms of Fatigue, Fever, Marked Weight Change. Eyes Complains or has symptoms of Glasses / Contacts - reading glasses. Denies complaints or symptoms of Dry Eyes, Vision Changes. Ear/Nose/Mouth/Throat HOH-wears hearing aids in both ears Hematologic/Lymphatic thrombocytopenia Respiratory Complains or has symptoms of Shortness of Breath - occasionAL. Denies complaints or symptoms of Chronic or frequent coughs. Cardiovascular HEART MURMUR, hyperlipidemia, Gastrointestinal Denies complaints or symptoms of Frequent diarrhea, Nausea, Vomiting, gerd, non-alcoholic fatty liver disease Endocrine Denies complaints or symptoms of Heat/cold intolerance. [address] ARIONA, DESCHENE ([id number])  [id number] Page 5 of 9 Denies complaints or symptoms of Frequent urination, CKD STAGE 1 Integumentary (Skin) Complains or has symptoms of Wounds - left leg , right buttock. Psychiatric Complains or has symptoms of Claustrophobia. Objective Constitutional respirations regular, non-labored and within target range for patient.. Vitals Time Taken: 9:52 AM, Height: 63 in, Source: Stated, Weight: 120 lbs, Source: Stated, BMI: 21.3, Temperature: 97.8 F, Pulse: 75 bpm, Respiratory Rate: 18 breaths/min, Blood Pressure: 126/59 mmHg, Capillary Blood Glucose: 200  mg/dl. Cardiovascular 2+ dorsalis pedis/posterior tibialis pulses. Psychiatric pleasant and cooperative. General Notes: T the posterior left leg there is a circular wound that tunnels. Small tendon exposed. No signs of surrounding infection including increased o warmth, erythema or purulent drainage. T the right buttocks there is an area of skin breakdown. o Integumentary (Hair, Skin) Wound #1 status is Open. Original cause of wound was Bump. The date acquired was: 05/09/2022. The wound is located on the Left,Posterior Lower Leg. The wound measures 1cm length x 1.5cm width x 1cm depth; 1.178cm^2 area and 1.178cm^3 volume. There is tendon exposed. There is no undermining noted, however, there is tunneling at 12:00 with a maximum distance of 3.5cm. There is a medium amount of serosanguineous drainage noted. The wound margin is distinct with the outline attached to the wound base. There is large (67-100%) granulation within the wound bed. There is no necrotic tissue within the wound bed. The periwound skin appearance did not exhibit: Callus, Crepitus, Excoriation, Induration, Rash, Scarring, Dry/Scaly, Maceration, Atrophie Blanche, Cyanosis, Ecchymosis, Hemosiderin Staining, Mottled, Pallor, Rubor, Erythema. Wound #2 status is Open. Original cause of wound was Shear/Friction. The date acquired was: 08/07/2021. The wound is located on the Right Gluteus. The wound measures 2cm length x 1cm width x 0.1cm depth; 1.571cm^2 area and 0.157cm^3 volume. There is Fat Layer (Subcutaneous Tissue) exposed. There is no tunneling or undermining noted. There is a small amount of serosanguineous drainage noted. The wound margin is distinct with the outline attached to the wound base. There is large (67-100%) pink, pale granulation within the wound bed. There is no necrotic tissue within the wound bed. The periwound skin appearance exhibited: Dry/Scaly. The periwound skin appearance did not exhibit: Callus, Crepitus,  Excoriation, Induration, Rash, Scarring, Maceration, Atrophie Blanche, Cyanosis, Ecchymosis, Hemosiderin Staining, Mottled, Pallor, Rubor, Erythema. Assessment Active Problems ICD-10 Non-pressure chronic ulcer of other part of left lower leg with muscle involvement without evidence of necrosis Type 2 diabetes mellitus with other skin ulcer Pressure ulcer of right buttock, stage 2 Unspecified cirrhosis of liver Unspecified severe protein-calorie malnutrition Patient presents with a 80-month history of nonhealing ulcer to the left posterior leg that was started as cellulitis and nonhealing in the setting of type 2 diabetes and severe protein malnutrition. Since she is doing well with silver alginate I recommended continuing this. ABI in office was 0.98 and she should have adequate blood flow for wound healing. She can wear her compression stockings daily as well. T the right buttocks there is an area of skin breakdown. We o discussed the importance of aggressive offloading for wound healing. She can continue silver alginate here as well. We will order her a gel over lay. Follow- up in 1 week. Plan Follow-up Appointments: Return Appointment in 1 week. - Dr. Mikey Bussing 08/30/2022 Tuesday 1245pm room 8 Anesthetic: (In clinic) Topical Lidocaine 4% applied to wound bed Bathing/ Shower/ Hygiene: May shower with protection but do not get wound dressing(s) wet. Protect dressing(s) with water repellant cover (for example, large plastic bag) or  a cast cover and may then take shower. Edema Control - Lymphedema / SCD / Other: Elevate legs to the level of the heart or above for 30 minutes daily and/or when sitting for 3-4 times a day throughout the day. Avoid standing for long periods of time. Patient to wear own compression stockings every day. - apply in the morning and remove at night. Exercise regularly Ollen BowlVANCE, Madalyne C ([id number]) [id number] Page 6 of 9 Moisturize legs  daily. Home Health: New wound care orders this week; continue Home Health for wound care. May utilize formulary equivalent dressing for wound treatment orders unless otherwise specified. - home health to twice a week and family to change every other day. continue to pack left leg wound with alginate Ag, cover with a border foam and apply compression stocking. Buttock change daily with alginate Ag and bordered foam- family to change all other days home health does not. Other [facility] WOUND #1: - Lower Leg Wound Laterality: Left, Posterior Cleanser: Wound Cleanser (Home Health) Every Other Day/30 Days Discharge Instructions: Cleanse the wound with wound cleanser prior to applying a clean dressing using gauze sponges, not tissue or cotton balls. Peri-Wound Care: Skin Prep [facility](Home Health) Every Other Day/30 Days Discharge Instructions: Use skin prep as directed Prim Dressing: Maxorb Extra Ag+ Alginate Dressing, 4x4.75 (in/in) (Home Health) Every Other Day/30 Days ary Discharge Instructions: Lightly pack into tunnel, apply to wound bed, and leave a tail out to remove. Secondary Dressing: Zetuvit Plus Silicone Border Dressing 4x4 (in/in) (Home Health) Every Other Day/30 Days Discharge Instructions: Apply silicone border over primary dressing as directed. WOUND #2: - Gluteus Wound Laterality: Right Cleanser: Soap and Water (Home Health) 1 x Per Day/30 Days Discharge Instructions: May shower and wash wound with dial antibacterial soap and water prior to dressing change. Peri-Wound Care: Skin Prep (Home Health) 1 x Per Day/30 Days Discharge Instructions: Use skin prep as directed Prim Dressing: Maxorb Extra Ag+ Alginate Dressing, 2x2 (in/in) (Home Health) 1 x Per Day/30 Days ary Discharge Instructions: Apply to wound bed as instructed Secondary Dressing: Zetuvit Plus Silicone Border Dressing 4x4 (in/in) (Home Health) 1 x Per Day/30 Days Discharge  Instructions: Apply silicone border over primary dressing as directed. 1. Silver alginate 2. Aggressive offloading 3. Follow-up in 1 week 4. Gel overlay Electronic Signature(s) Signed: 08/16/2022 12:40:48 PM By: Geralyn CorwinHoffman, Twylah Bennetts DO Entered By: Geralyn CorwinHoffman, Ashok Sawaya on 08/16/2022 11:04:58 -------------------------------------------------------------------------------- HxROS Details Patient Name: Date of Service: Kayla JulyV A NCE, ELIZA BETH C. 08/16/2022 9:30 A M Medical Record Number: [id number] Patient Account Number: [id number] Date of Birth/Sex: Treating RN: Feb 20, 1946 (77 y.o. Ardis RowanF) Breedlove, Lauren Primary Care Provider: Jacalyn LefevreMiller , Stephen Other Clinician: Referring Provider: Treating Provider/Extender: Geralyn CorwinHoffman, Nayson Traweek Ngetich, Dinah Weeks in Treatment: 0 Information Obtained From Patient Constitutional Symptoms (General Health) Complaints and Symptoms: Positive for: Chills Negative for: Fatigue; Fever; Marked Weight Change Eyes Complaints and Symptoms: Positive for: Glasses / Contacts - reading glasses Negative for: Dry Eyes; Vision Changes Respiratory Complaints and Symptoms: Positive for: Shortness of Breath - occasionAL Negative for: Chronic or frequent coughs Medical History: Negative for: Aspiration; Asthma; Chronic Obstructive Pulmonary Disease (COPD); Pneumothorax; Sleep Apnea; Tuberculosis Gastrointestinal Complaints and Symptoms: Negative for: Frequent diarrhea; Nausea; Vomiting Review of System Notes: gerd, non-alcoholic fatty liver disease Medical History: Positive for: Cirrhosis - fatty liver Ollen BowlVANCE, Jancie C ([id number]) [id number] Page 7 of 9 Negative for: Colitis; Crohns; Hepatitis A; Hepatitis B; Hepatitis C Endocrine Complaints and Symptoms: Negative for: Heat/cold intolerance Medical  History: Positive for: Type II Diabetes Negative for: Type I Diabetes Time with diabetes: 10 years Treated with: Insulin Blood sugar tested every day:  Yes Tested : once Genitourinary Complaints and Symptoms: Negative for: Frequent urination Review of System Notes: CKD STAGE 1 Integumentary (Skin) Complaints and Symptoms: Positive for: Wounds - left leg , right buttock Medical History: Negative for: History of Burn Psychiatric Complaints and Symptoms: Positive for: Claustrophobia Medical History: Positive for: Confinement Anxiety Negative for: Anorexia/bulimia Ear/Nose/Mouth/Throat Complaints and Symptoms: Review of System Notes: HOH-wears hearing aids in both ears Medical History: Positive for: Chronic sinus problems/congestion Negative for: Middle ear problems Hematologic/Lymphatic Complaints and Symptoms: Review of System Notes: thrombocytopenia Medical History: Negative for: Anemia; Hemophilia; Human Immunodeficiency Virus; Lymphedema; Sickle Cell Disease Cardiovascular Complaints and Symptoms: Review of System Notes: HEART MURMUR, hyperlipidemia, Medical History: Positive for: Arrhythmia - Paroxysymal A fib; Congestive Heart Failure; Coronary Artery Disease; Hypertension Negative for: Angina; Deep Vein Thrombosis; Hypotension; Myocardial Infarction; Peripheral Arterial Disease; Peripheral Venous Disease; Phlebitis; Vasculitis Immunological Medical History: Negative for: Lupus Erythematosus; Raynauds; Scleroderma Musculoskeletal Medical History: Positive for: Osteoarthritis Negative for: Gout; Rheumatoid Arthritis; Osteomyelitis Neurologic Medical History: Positive for: Neuropathy CYNIA, KARWOWSKI ([id number]) [phone].pdf Page 8 of 9 Negative for: Dementia; Quadriplegia; Paraplegia; Seizure Disorder Oncologic Medical History: Negative for: Received Chemotherapy; Received Radiation HBO Extended History Items Ear/Nose/Mouth/Throat: Chronic sinus problems/congestion Immunizations Pneumococcal Vaccine: Received Pneumococcal Vaccination: Yes Received Pneumococcal Vaccination On  or After 60th Birthday: Yes Implantable Devices None Hospitalization / Surgery History Type of Hospitalization/Surgery tips in liver open heart surgery CVA x 2 Family and Social History Cancer: Yes - Father; Diabetes: No; Heart Disease: Yes - Mother; Hereditary Spherocytosis: No; Hypertension: Yes - Mother; Kidney Disease: No; Lung Disease: Yes - Father; Seizures: No; Stroke: No; Thyroid Problems: No; Tuberculosis: No; Never smoker; Marital Status - Married; Alcohol Use: Never; Drug Use: No History; Caffeine Use: Daily; Financial Concerns: No; Food, Clothing or Shelter Needs: No; Support System Lacking: No; Transportation Concerns: No Electronic Signature(s) Signed: 08/16/2022 12:40:48 PM By: Geralyn Corwin DO Signed: 08/16/2022 2:53:03 PM By: Thayer Dallas Signed: 08/17/2022 3:30:40 PM By: Fonnie Mu RN Entered By: Thayer Dallas on 08/16/2022 10:02:06 -------------------------------------------------------------------------------- SuperBill Details Patient Name: Date of Service: [name] 08/16/2022 Medical Record Number: [id number] Patient Account Number: [id number] Date of Birth/Sex: Treating RN: 1945-12-09 (78 y.o. F) Primary Care Provider: Jacalyn Lefevre Other Clinician: Referring Provider: Treating Provider/Extender: Geralyn Corwin Ngetich, Dinah Weeks in Treatment: 0 Diagnosis Coding ICD-10 Codes Code Description [phone] Non-pressure chronic ulcer of other part of left lower leg with muscle involvement without evidence of necrosis E11.622 Type 2 diabetes mellitus with other skin ulcer L89.312 Pressure ulcer of right buttock, stage 2 K74.60 Unspecified cirrhosis of liver E43 Unspecified severe protein-calorie malnutrition Facility Procedures : CPT4 Code: [id number] Description: 59163 - WOUND CARE VISIT-LEV 5 EST PT Modifier: Quantity: 1 Physician Procedures : CPT4 Code Description Modifier [id number] - WC PHYS LEVEL 4 - NEW PT ICD-10 Diagnosis  Description L97.825 Non-pressure chronic ulcer of other part of left lower leg with muscle involvement without evidence E11.622 Type 2 diabetes mellitus with other  skin ulcer TANJA, DEVER C ([id number]) [id number] Page 9 L89.312 Pressure ulcer of right buttock, stage 2 K74.60 Unspecified cirrhosis of liver Quantity: 1 of necrosis of 9 Electronic Signature(s) Signed: 08/17/2022 2:36:01 PM By: Pearletha Alfred Signed: 08/18/2022 10:39:43 AM By: Geralyn Corwin DO Previous Signature: 08/16/2022 12:40:48 PM Version By: Geralyn Corwin DO Entered By: Pearletha Alfred on 08/17/2022 14:36:01

## 2022-08-19 DIAGNOSIS — I130 Hypertensive heart and chronic kidney disease with heart failure and stage 1 through stage 4 chronic kidney disease, or unspecified chronic kidney disease: Secondary | ICD-10-CM | POA: Diagnosis not present

## 2022-08-19 DIAGNOSIS — L03116 Cellulitis of left lower limb: Secondary | ICD-10-CM | POA: Diagnosis not present

## 2022-08-19 DIAGNOSIS — N1831 Chronic kidney disease, stage 3a: Secondary | ICD-10-CM | POA: Diagnosis not present

## 2022-08-19 DIAGNOSIS — E1122 Type 2 diabetes mellitus with diabetic chronic kidney disease: Secondary | ICD-10-CM | POA: Diagnosis not present

## 2022-08-19 DIAGNOSIS — I5033 Acute on chronic diastolic (congestive) heart failure: Secondary | ICD-10-CM | POA: Diagnosis not present

## 2022-08-19 DIAGNOSIS — L89893 Pressure ulcer of other site, stage 3: Secondary | ICD-10-CM | POA: Diagnosis not present

## 2022-08-21 ENCOUNTER — Emergency Department (HOSPITAL_COMMUNITY): Payer: Medicare Other

## 2022-08-21 ENCOUNTER — Inpatient Hospital Stay (HOSPITAL_COMMUNITY)
Admission: EM | Admit: 2022-08-21 | Discharge: 2022-08-23 | DRG: 082 | Disposition: A | Discharge status: Home Health | Payer: Medicare Other | Attending: Student | Admitting: Student

## 2022-08-21 ENCOUNTER — Other Ambulatory Visit: Payer: Self-pay

## 2022-08-21 ENCOUNTER — Other Ambulatory Visit: Payer: Self-pay | Admitting: Gastroenterology

## 2022-08-21 ENCOUNTER — Encounter (HOSPITAL_COMMUNITY): Payer: Self-pay

## 2022-08-21 DIAGNOSIS — D696 Thrombocytopenia, unspecified: Secondary | ICD-10-CM | POA: Diagnosis present

## 2022-08-21 DIAGNOSIS — E876 Hypokalemia: Secondary | ICD-10-CM | POA: Diagnosis present

## 2022-08-21 DIAGNOSIS — K7581 Nonalcoholic steatohepatitis (NASH): Secondary | ICD-10-CM

## 2022-08-21 DIAGNOSIS — I6200 Nontraumatic subdural hemorrhage, unspecified: Secondary | ICD-10-CM | POA: Diagnosis not present

## 2022-08-21 DIAGNOSIS — N181 Chronic kidney disease, stage 1: Secondary | ICD-10-CM | POA: Diagnosis not present

## 2022-08-21 DIAGNOSIS — K388 Other specified diseases of appendix: Secondary | ICD-10-CM | POA: Diagnosis present

## 2022-08-21 DIAGNOSIS — S32009A Unspecified fracture of unspecified lumbar vertebra, initial encounter for closed fracture: Secondary | ICD-10-CM | POA: Diagnosis present

## 2022-08-21 DIAGNOSIS — K7460 Unspecified cirrhosis of liver: Secondary | ICD-10-CM | POA: Diagnosis present

## 2022-08-21 DIAGNOSIS — K3189 Other diseases of stomach and duodenum: Secondary | ICD-10-CM | POA: Diagnosis not present

## 2022-08-21 DIAGNOSIS — M542 Cervicalgia: Secondary | ICD-10-CM | POA: Diagnosis present

## 2022-08-21 DIAGNOSIS — M1990 Unspecified osteoarthritis, unspecified site: Secondary | ICD-10-CM | POA: Diagnosis present

## 2022-08-21 DIAGNOSIS — G8929 Other chronic pain: Secondary | ICD-10-CM | POA: Diagnosis present

## 2022-08-21 DIAGNOSIS — E1122 Type 2 diabetes mellitus with diabetic chronic kidney disease: Secondary | ICD-10-CM | POA: Diagnosis present

## 2022-08-21 DIAGNOSIS — S81802D Unspecified open wound, left lower leg, subsequent encounter: Secondary | ICD-10-CM

## 2022-08-21 DIAGNOSIS — I81 Portal vein thrombosis: Secondary | ICD-10-CM | POA: Diagnosis present

## 2022-08-21 DIAGNOSIS — S32038A Other fracture of third lumbar vertebra, initial encounter for closed fracture: Secondary | ICD-10-CM | POA: Diagnosis present

## 2022-08-21 DIAGNOSIS — S066XAA Traumatic subarachnoid hemorrhage with loss of consciousness status unknown, initial encounter: Secondary | ICD-10-CM | POA: Diagnosis not present

## 2022-08-21 DIAGNOSIS — Z888 Allergy status to other drugs, medicaments and biological substances status: Secondary | ICD-10-CM

## 2022-08-21 DIAGNOSIS — Z8249 Family history of ischemic heart disease and other diseases of the circulatory system: Secondary | ICD-10-CM

## 2022-08-21 DIAGNOSIS — Z87440 Personal history of urinary (tract) infections: Secondary | ICD-10-CM

## 2022-08-21 DIAGNOSIS — W19XXXA Unspecified fall, initial encounter: Principal | ICD-10-CM

## 2022-08-21 DIAGNOSIS — Z974 Presence of external hearing-aid: Secondary | ICD-10-CM

## 2022-08-21 DIAGNOSIS — S32018A Other fracture of first lumbar vertebra, initial encounter for closed fracture: Secondary | ICD-10-CM | POA: Diagnosis present

## 2022-08-21 DIAGNOSIS — Z90710 Acquired absence of both cervix and uterus: Secondary | ICD-10-CM

## 2022-08-21 DIAGNOSIS — I130 Hypertensive heart and chronic kidney disease with heart failure and stage 1 through stage 4 chronic kidney disease, or unspecified chronic kidney disease: Secondary | ICD-10-CM | POA: Diagnosis present

## 2022-08-21 DIAGNOSIS — I2510 Atherosclerotic heart disease of native coronary artery without angina pectoris: Secondary | ICD-10-CM | POA: Diagnosis present

## 2022-08-21 DIAGNOSIS — Z951 Presence of aortocoronary bypass graft: Secondary | ICD-10-CM

## 2022-08-21 DIAGNOSIS — Z1612 Extended spectrum beta lactamase (ESBL) resistance: Secondary | ICD-10-CM | POA: Diagnosis not present

## 2022-08-21 DIAGNOSIS — Z91040 Latex allergy status: Secondary | ICD-10-CM

## 2022-08-21 DIAGNOSIS — Z8616 Personal history of COVID-19: Secondary | ICD-10-CM | POA: Diagnosis not present

## 2022-08-21 DIAGNOSIS — B961 Klebsiella pneumoniae [K. pneumoniae] as the cause of diseases classified elsewhere: Secondary | ICD-10-CM | POA: Diagnosis present

## 2022-08-21 DIAGNOSIS — I5033 Acute on chronic diastolic (congestive) heart failure: Secondary | ICD-10-CM | POA: Diagnosis present

## 2022-08-21 DIAGNOSIS — S065XAA Traumatic subdural hemorrhage with loss of consciousness status unknown, initial encounter: Principal | ICD-10-CM | POA: Diagnosis present

## 2022-08-21 DIAGNOSIS — F419 Anxiety disorder, unspecified: Secondary | ICD-10-CM | POA: Diagnosis present

## 2022-08-21 DIAGNOSIS — D631 Anemia in chronic kidney disease: Secondary | ICD-10-CM | POA: Diagnosis present

## 2022-08-21 DIAGNOSIS — M488X2 Other specified spondylopathies, cervical region: Secondary | ICD-10-CM | POA: Diagnosis present

## 2022-08-21 DIAGNOSIS — Z887 Allergy status to serum and vaccine status: Secondary | ICD-10-CM

## 2022-08-21 DIAGNOSIS — I609 Nontraumatic subarachnoid hemorrhage, unspecified: Secondary | ICD-10-CM

## 2022-08-21 DIAGNOSIS — M25552 Pain in left hip: Secondary | ICD-10-CM | POA: Diagnosis present

## 2022-08-21 DIAGNOSIS — I252 Old myocardial infarction: Secondary | ICD-10-CM

## 2022-08-21 DIAGNOSIS — Z7982 Long term (current) use of aspirin: Secondary | ICD-10-CM

## 2022-08-21 DIAGNOSIS — N390 Urinary tract infection, site not specified: Secondary | ICD-10-CM | POA: Diagnosis not present

## 2022-08-21 DIAGNOSIS — E1142 Type 2 diabetes mellitus with diabetic polyneuropathy: Secondary | ICD-10-CM | POA: Diagnosis not present

## 2022-08-21 DIAGNOSIS — E1165 Type 2 diabetes mellitus with hyperglycemia: Secondary | ICD-10-CM | POA: Diagnosis present

## 2022-08-21 DIAGNOSIS — Z7951 Long term (current) use of inhaled steroids: Secondary | ICD-10-CM

## 2022-08-21 DIAGNOSIS — E785 Hyperlipidemia, unspecified: Secondary | ICD-10-CM | POA: Diagnosis present

## 2022-08-21 DIAGNOSIS — I480 Paroxysmal atrial fibrillation: Secondary | ICD-10-CM | POA: Diagnosis not present

## 2022-08-21 DIAGNOSIS — W01198A Fall on same level from slipping, tripping and stumbling with subsequent striking against other object, initial encounter: Secondary | ICD-10-CM | POA: Diagnosis present

## 2022-08-21 DIAGNOSIS — R9431 Abnormal electrocardiogram [ECG] [EKG]: Secondary | ICD-10-CM | POA: Diagnosis not present

## 2022-08-21 DIAGNOSIS — Z981 Arthrodesis status: Secondary | ICD-10-CM

## 2022-08-21 DIAGNOSIS — I5032 Chronic diastolic (congestive) heart failure: Secondary | ICD-10-CM | POA: Diagnosis present

## 2022-08-21 DIAGNOSIS — Z960 Presence of urogenital implants: Secondary | ICD-10-CM | POA: Diagnosis present

## 2022-08-21 DIAGNOSIS — S065X0A Traumatic subdural hemorrhage without loss of consciousness, initial encounter: Secondary | ICD-10-CM

## 2022-08-21 DIAGNOSIS — D61818 Other pancytopenia: Secondary | ICD-10-CM | POA: Diagnosis present

## 2022-08-21 DIAGNOSIS — Z8261 Family history of arthritis: Secondary | ICD-10-CM

## 2022-08-21 DIAGNOSIS — Z794 Long term (current) use of insulin: Secondary | ICD-10-CM | POA: Diagnosis not present

## 2022-08-21 DIAGNOSIS — K219 Gastro-esophageal reflux disease without esophagitis: Secondary | ICD-10-CM | POA: Diagnosis present

## 2022-08-21 DIAGNOSIS — S81802A Unspecified open wound, left lower leg, initial encounter: Secondary | ICD-10-CM | POA: Diagnosis present

## 2022-08-21 DIAGNOSIS — Z79899 Other long term (current) drug therapy: Secondary | ICD-10-CM

## 2022-08-21 DIAGNOSIS — E119 Type 2 diabetes mellitus without complications: Secondary | ICD-10-CM

## 2022-08-21 DIAGNOSIS — R634 Abnormal weight loss: Secondary | ICD-10-CM | POA: Insufficient documentation

## 2022-08-21 DIAGNOSIS — S32028A Other fracture of second lumbar vertebra, initial encounter for closed fracture: Secondary | ICD-10-CM | POA: Diagnosis present

## 2022-08-21 DIAGNOSIS — Z043 Encounter for examination and observation following other accident: Secondary | ICD-10-CM | POA: Diagnosis not present

## 2022-08-21 DIAGNOSIS — Z7984 Long term (current) use of oral hypoglycemic drugs: Secondary | ICD-10-CM

## 2022-08-21 DIAGNOSIS — I10 Essential (primary) hypertension: Secondary | ICD-10-CM | POA: Diagnosis not present

## 2022-08-21 DIAGNOSIS — K766 Portal hypertension: Secondary | ICD-10-CM | POA: Diagnosis present

## 2022-08-21 DIAGNOSIS — N3020 Other chronic cystitis without hematuria: Secondary | ICD-10-CM | POA: Diagnosis present

## 2022-08-21 DIAGNOSIS — Y92009 Unspecified place in unspecified non-institutional (private) residence as the place of occurrence of the external cause: Secondary | ICD-10-CM

## 2022-08-21 DIAGNOSIS — Z91018 Allergy to other foods: Secondary | ICD-10-CM

## 2022-08-21 DIAGNOSIS — E1159 Type 2 diabetes mellitus with other circulatory complications: Secondary | ICD-10-CM

## 2022-08-21 DIAGNOSIS — I700 Atherosclerosis of aorta: Secondary | ICD-10-CM | POA: Diagnosis not present

## 2022-08-21 DIAGNOSIS — S199XXA Unspecified injury of neck, initial encounter: Secondary | ICD-10-CM | POA: Diagnosis not present

## 2022-08-21 DIAGNOSIS — S0003XA Contusion of scalp, initial encounter: Secondary | ICD-10-CM | POA: Diagnosis not present

## 2022-08-21 LAB — CBC WITH DIFFERENTIAL/PLATELET
Abs Immature Granulocytes: 0.01 10*3/uL (ref 0.00–0.07)
Basophils Absolute: 0 10*3/uL (ref 0.0–0.1)
Basophils Relative: 1 %
Eosinophils Absolute: 0.1 10*3/uL (ref 0.0–0.5)
Eosinophils Relative: 1 %
HCT: 35.5 % — ABNORMAL LOW (ref 36.0–46.0)
Hemoglobin: 11.2 g/dL — ABNORMAL LOW (ref 12.0–15.0)
Immature Granulocytes: 0 %
Lymphocytes Relative: 17 %
Lymphs Abs: 0.7 10*3/uL (ref 0.7–4.0)
MCH: 30.8 pg (ref 26.0–34.0)
MCHC: 31.5 g/dL (ref 30.0–36.0)
MCV: 97.5 fL (ref 80.0–100.0)
Monocytes Absolute: 0.5 10*3/uL (ref 0.1–1.0)
Monocytes Relative: 11 %
Neutro Abs: 3 10*3/uL (ref 1.7–7.7)
Neutrophils Relative %: 70 %
Platelets: 134 10*3/uL — ABNORMAL LOW (ref 150–400)
RBC: 3.64 MIL/uL — ABNORMAL LOW (ref 3.87–5.11)
RDW: 16.2 % — ABNORMAL HIGH (ref 11.5–15.5)
WBC: 4.3 10*3/uL (ref 4.0–10.5)
nRBC: 0 % (ref 0.0–0.2)

## 2022-08-21 LAB — COMPREHENSIVE METABOLIC PANEL
ALT: 42 U/L (ref 0–44)
AST: 49 U/L — ABNORMAL HIGH (ref 15–41)
Albumin: 2.9 g/dL — ABNORMAL LOW (ref 3.5–5.0)
Alkaline Phosphatase: 87 U/L (ref 38–126)
Anion gap: 12 (ref 5–15)
BUN: 18 mg/dL (ref 8–23)
CO2: 23 mmol/L (ref 22–32)
Calcium: 9.6 mg/dL (ref 8.9–10.3)
Chloride: 103 mmol/L (ref 98–111)
Creatinine, Ser: 0.69 mg/dL (ref 0.44–1.00)
GFR, Estimated: >60 mL/min (ref >60)
Glucose, Bld: 188 mg/dL — ABNORMAL HIGH (ref 70–99)
Potassium: 3.4 mmol/L — ABNORMAL LOW (ref 3.5–5.1)
Sodium: 138 mmol/L (ref 135–145)
Total Bilirubin: 1.3 mg/dL — ABNORMAL HIGH (ref 0.3–1.2)
Total Protein: 7.1 g/dL (ref 6.5–8.1)

## 2022-08-21 LAB — AMMONIA: Ammonia: 22 umol/L (ref 9–35)

## 2022-08-21 LAB — URINALYSIS, ROUTINE W REFLEX MICROSCOPIC
Bilirubin Urine: NEGATIVE
Glucose, UA: >=500 mg/dL — AB
Hgb urine dipstick: NEGATIVE
Ketones, ur: NEGATIVE mg/dL
Leukocytes,Ua: NEGATIVE
Nitrite: POSITIVE — AB
Protein, ur: NEGATIVE mg/dL
Specific Gravity, Urine: 1.031 — ABNORMAL HIGH (ref 1.005–1.030)
pH: 5 (ref 5.0–8.0)

## 2022-08-21 LAB — GLUCOSE, CAPILLARY
Glucose-Capillary: 128 mg/dL — ABNORMAL HIGH (ref 70–99)
Glucose-Capillary: 383 mg/dL — ABNORMAL HIGH (ref 70–99)

## 2022-08-21 LAB — PROTIME-INR
INR: 1.2 (ref 0.8–1.2)
Prothrombin Time: 15.3 seconds — ABNORMAL HIGH (ref 11.4–15.2)

## 2022-08-21 MED ORDER — ACETAMINOPHEN 325 MG PO TABS
650.0000 mg | ORAL_TABLET | Freq: Four times a day (QID) | ORAL | Status: DC | PRN
  Administered 2022-08-22 – 2022-08-23 (×2): 650 mg via ORAL
  Filled 2022-08-21 (×2): qty 2

## 2022-08-21 MED ORDER — FUROSEMIDE 40 MG PO TABS: 40.0000 mg | ORAL_TABLET | Freq: Every day | ORAL | Status: DC

## 2022-08-21 MED ORDER — FUROSEMIDE 40 MG PO TABS
40.0000 mg | ORAL_TABLET | Freq: Every day | ORAL | Status: DC
  Administered 2022-08-22: 40 mg via ORAL
  Filled 2022-08-21 (×2): qty 1

## 2022-08-21 MED ORDER — SODIUM CHLORIDE 0.9% FLUSH
3.0000 mL | Freq: Two times a day (BID) | INTRAVENOUS | Status: DC
  Administered 2022-08-21 – 2022-08-22 (×3): 3 mL via INTRAVENOUS

## 2022-08-21 MED ORDER — MECLIZINE HCL 25 MG PO TABS: 25.0000 mg | ORAL_TABLET | Freq: Three times a day (TID) | ORAL | Status: DC | PRN

## 2022-08-21 MED ORDER — POTASSIUM CHLORIDE CRYS ER 20 MEQ PO TBCR
40.0000 meq | EXTENDED_RELEASE_TABLET | ORAL | Status: AC
  Administered 2022-08-21: 40 meq via ORAL
  Filled 2022-08-21: qty 2

## 2022-08-21 MED ORDER — SODIUM CHLORIDE 0.9 % IV SOLN
1.0000 g | Freq: Once | INTRAVENOUS | Status: AC
  Administered 2022-08-21: 1 g via INTRAVENOUS
  Filled 2022-08-21: qty 10

## 2022-08-21 MED ORDER — IOHEXOL 350 MG/ML SOLN
75.0000 mL | Freq: Once | INTRAVENOUS | Status: AC | PRN
  Administered 2022-08-21: 75 mL via INTRAVENOUS

## 2022-08-21 MED ORDER — ACETAMINOPHEN 650 MG RE SUPP: 650.0000 mg | Freq: Four times a day (QID) | RECTAL | Status: DC | PRN

## 2022-08-21 MED ORDER — INSULIN GLARGINE-YFGN 100 UNIT/ML ~~LOC~~ SOLN
10.0000 [IU] | Freq: Every day | SUBCUTANEOUS | Status: DC
  Administered 2022-08-21: 10 [IU] via SUBCUTANEOUS
  Filled 2022-08-21 (×2): qty 0.1

## 2022-08-21 MED ORDER — SODIUM CHLORIDE 0.9 % IV SOLN
1.0000 g | INTRAVENOUS | Status: DC
  Administered 2022-08-22: 1 g via INTRAVENOUS
  Filled 2022-08-21: qty 10

## 2022-08-21 MED ORDER — OXYCODONE HCL 5 MG PO TABS
2.5000 mg | ORAL_TABLET | Freq: Four times a day (QID) | ORAL | Status: DC | PRN
  Administered 2022-08-22 (×2): 2.5 mg via ORAL
  Filled 2022-08-21 (×2): qty 1

## 2022-08-21 MED ORDER — LACTULOSE 10 GM/15ML PO SOLN
20.0000 g | Freq: Every day | ORAL | Status: DC
  Administered 2022-08-21: 20 g via ORAL
  Filled 2022-08-21 (×3): qty 30

## 2022-08-21 MED ORDER — ALBUTEROL SULFATE (2.5 MG/3ML) 0.083% IN NEBU: 2.5000 mg | INHALATION_SOLUTION | RESPIRATORY_TRACT | Status: DC | PRN

## 2022-08-21 MED ORDER — TRIMETHOBENZAMIDE HCL 100 MG/ML IM SOLN: 200.0000 mg | Freq: Four times a day (QID) | INTRAMUSCULAR | Status: DC | PRN

## 2022-08-21 MED ORDER — INSULIN ASPART 100 UNIT/ML IJ SOLN
0.0000 [IU] | Freq: Every day | INTRAMUSCULAR | Status: DC
  Administered 2022-08-21: 5 [IU] via SUBCUTANEOUS

## 2022-08-21 MED ORDER — INSULIN ASPART 100 UNIT/ML IJ SOLN
0.0000 [IU] | Freq: Three times a day (TID) | INTRAMUSCULAR | Status: DC
  Administered 2022-08-21: 2 [IU] via SUBCUTANEOUS
  Administered 2022-08-22: 8 [IU] via SUBCUTANEOUS
  Administered 2022-08-22: 5 [IU] via SUBCUTANEOUS
  Administered 2022-08-22: 15 [IU] via SUBCUTANEOUS
  Administered 2022-08-23: 3 [IU] via SUBCUTANEOUS

## 2022-08-21 MED ORDER — POLYVINYL ALCOHOL 1.4 % OP SOLN
1.0000 [drp] | Freq: Every day | OPHTHALMIC | Status: DC
  Administered 2022-08-21: 2 [drp] via OPHTHALMIC
  Administered 2022-08-22: 1 [drp] via OPHTHALMIC
  Filled 2022-08-21: qty 15

## 2022-08-21 MED ORDER — ACETAMINOPHEN 325 MG PO TABS
650.0000 mg | ORAL_TABLET | Freq: Once | ORAL | Status: AC
  Administered 2022-08-21: 650 mg via ORAL
  Filled 2022-08-21: qty 2

## 2022-08-21 NOTE — Progress Notes (Signed)
Pt admitted to 3W AxOx4, VS wnL and as per flow. Pt and family oriented to 3W processes. All questions and concerns addressed. Multiple bruises noted from fall as well as previous skin impairments; see flow sheet. Call bell placed within reach, will continue to monitor and maintain safety.

## 2022-08-21 NOTE — Progress Notes (Signed)
Called in regards to this patient's head CT after a fall. Shows a small left sided SDH along the tentorium and anterior falx. Small tSAH along the left temporal lobe. No surgical intervention required. Recommend follow up head CT in the morning.

## 2022-08-21 NOTE — Progress Notes (Signed)
Arrived from ER via stretcher; patient is alert; spouse at bedside; oriented to room; fall safety reviewed.

## 2022-08-21 NOTE — H&P (Signed)
History and Physical    Patient: [name] ZOX:[id number] DOB: 1945/11/22 DOA: 08/21/2022 DOS: the patient was seen and examined on 08/21/2022 PCP: Frederica Kuster, MD  Patient coming from: Home  Chief Complaint:    HPI: [name] is a 77 y.o. female with medical history significant of NASH cirrhosis s/p TIPs, diastolic CHF, chronic edema, DM type 2, HLD, anxiety, and lumbar fusion who presents after having a fall at home.  Normally patient gets around with the use of a cane or rollator.  Overnight she had been going to the bathroom when she thinks she lost her balance falling backwards, but is not totally sure.  Feels she hit her head on the sewing machine, but denied any loss of consciousness.  After the fall patient complained of pain in the posterior aspect of her head and her left hip.  She is not currently on any blood thinners.  She has been having urinary frequency, but denies having any discomfort with urination.  She had a history of frequent urinary tract infections in the past.  Records note that she had been hospitalized in January at [facility] was found to have bilateral cerebral infarcts thought to be the cause for frequent falls with slurred speech, hepatic encephalopathy, and a left lower extremity cellulitis.  She subsequently was admitted here at [facility] and February for frequent falls thought to be secondary to BPPV.  MRI at that time did not show any acute infarct, but did have acute on chronic diastolic congestive heart failure treated with IV Lasix.  She was discharged to rehab, but has since been home 3/22.  Family does note that over the last couple months she has lost approximately 30 pounds.  She has a wound on the posterior aspect of the left leg that has been draining, but no significant redness or erythema has been appreciated.   In the emergency department patient was noted to be afebrile with vital signs  relatively stable.  Labs noted hemoglobin 11.2, platelets 134, potassium 3.4, glucose 188, albumin 2.9, AST 49, and total bilirubin 1.3.  CT imaging of the head and cervical spine revealed acute subdural hemorrhage along the left anterior falx and left tentorial leaflet measuring up to 7 mm in thickness, a small volume of adjacent acute subarachnoid hemorrhage along the posterior left temporal convexity, and a lytic lesion noted at C6 vertebral body posterior elements.  Urinalysis positive for glucose, nitrites, elevated specific gravity, few bacteria, and 0-5 WBCs. Review of Systems: As mentioned in the history of present illness. All other systems reviewed and are negative. Past Medical History:  Diagnosis Date   Chronic cystitis    Chronic diastolic CHF (congestive heart failure)    followed by dr Eden Emms   Chronic kidney disease, stage I    Cirrhosis of liver without ascites 03/2016   followed by dr Christella Hartigan (GI);   due to fatty liver;   post op cabg 09/ 2017  , post paracentesis for ascites   Coronary artery disease 01/2016   cardiologist--- dr Estrella Myrtle;   01-27-2016  NSTEMI  s/p cardiac cath;   s/p CABG 01/29/16: LIMA-LAD, SVG-RI, SVG-dRCA   DDD (degenerative disc disease), cervical    per pt w/ chronic neck pain   Dyspnea    occasional   Edema of right lower extremity    Esophageal varices    followed by dr Christella Hartigan;   hx multiple banding of large varices   Essential hypertension  no meds   GERD (gastroesophageal reflux disease)    Heart murmur     DX FOR YEARS ASYMPTOMATIC   Hepatic encephalopathy    History of adenomatous polyp of colon    History of COVID-19 04/2020   per pt mild  symptoms that resolve   History of kidney stones    passed stones   History of non-ST elevation myocardial infarction (NSTEMI) 01/27/2016   Hyperlipidemia    diet controlled   NAFLD (nonalcoholic fatty liver disease)    Neuropathy, peripheral 04/01/2015   OA (osteoarthritis)    Paroxysmal A-fib     08/19/19 afib with RVR, spontaneously converted to SR   Portal vein thrombosis    per dr Christella Hartigan  ,  chronic non-occluded   Thrombocytopenia    followed by pcp  and hematology-- dr Mosetta Putt;   likely secondary to liver cirrhosis   Type 2 diabetes mellitus treated with insulin    Type 2, followed by pcp    (07-21-2021  per pt checks blood sugar dialy in am fasting average 113--200s)   Urge incontinence of urine    Wears hearing aid in both ears    Past Surgical History:  Procedure Laterality Date   CARDIAC CATHETERIZATION N/A 01/27/2016   Procedure: Left Heart Cath and Coronary Angiography;  Surgeon: Lyn Records, MD;  Location: [facility] INVASIVE CV LAB;  Service: Cardiovascular;  Laterality: N/A;   COLONOSCOPY WITH PROPOFOL N/A 07/07/2016   Procedure: COLONOSCOPY WITH PROPOFOL;  Surgeon: Rachael Fee, MD;  Location: [facility] ENDOSCOPY;  Service: Endoscopy;  Laterality: N/A;   CORONARY ARTERY BYPASS GRAFT N/A 01/28/2016   Procedure: CORONARY ARTERY BYPASS GRAFTING (CABG) x 3 USING RIGHT LEG GREATER SAPHENOUS VEIN GRAFT;  Surgeon: Loreli Slot, MD;  Location: [facility] OR;  Service: Open Heart Surgery;  Laterality: N/A;   CYSTOSCOPY WITH RETROGRADE PYELOGRAM, URETEROSCOPY AND STENT PLACEMENT Left 05/30/2021   Procedure: CYSTOSCOPY WITH RETROGRADE PYELOGRAM, URETEROSCOPY AND STENT PLACEMENT;  Surgeon: Rene Paci, MD;  Location: [facility] OR;  Service: Urology;  Laterality: Left;   CYSTOSCOPY/URETEROSCOPY/HOLMIUM LASER/STENT PLACEMENT Left 07/26/2021   Procedure: CYSTOSCOPY/RETROGRADE/URETEROSCOPY/HOLMIUM LASER/STENT EXCHANGE;  Surgeon: Jannifer Hick, MD;  Location: [facility];  Service: Urology;  Laterality: Left;   ENDOVEIN HARVEST OF GREATER SAPHENOUS VEIN Right 01/28/2016   Procedure: ENDOVEIN HARVEST OF GREATER SAPHENOUS VEIN;  Surgeon: Loreli Slot, MD;  Location: [facility] OR;  Service: Open Heart Surgery;  Laterality: Right;   ESOPHAGEAL BANDING  03/28/2019   Procedure:  ESOPHAGEAL BANDING;  Surgeon: Rachael Fee, MD;  Location: [facility] ENDOSCOPY;  Service: Endoscopy;;   ESOPHAGEAL BANDING  04/07/2019   Procedure: ESOPHAGEAL BANDING;  Surgeon: Charna Tranesha, MD;  Location: [facility] ENDOSCOPY;  Service: Endoscopy;;   ESOPHAGOGASTRODUODENOSCOPY N/A 04/07/2019   Procedure: ESOPHAGOGASTRODUODENOSCOPY (EGD);  Surgeon: Charna Zeena, MD;  Location: [facility] ENDOSCOPY;  Service: Endoscopy;  Laterality: N/A;   ESOPHAGOGASTRODUODENOSCOPY (EGD) WITH PROPOFOL N/A 07/07/2016   Procedure: ESOPHAGOGASTRODUODENOSCOPY (EGD) WITH PROPOFOL;  Surgeon: Rachael Fee, MD;  Location: [facility] ENDOSCOPY;  Service: Endoscopy;  Laterality: N/A;   ESOPHAGOGASTRODUODENOSCOPY (EGD) WITH PROPOFOL N/A 03/28/2019   Procedure: ESOPHAGOGASTRODUODENOSCOPY (EGD) WITH PROPOFOL;  Surgeon: Rachael Fee, MD;  Location: [facility] ENDOSCOPY;  Service: Endoscopy;  Laterality: N/A;   EXTRACORPOREAL SHOCK WAVE LITHOTRIPSY  2010   HEMOSTASIS CLIP PLACEMENT  04/07/2019   Procedure: HEMOSTASIS CLIP PLACEMENT;  Surgeon: Charna Jaxie, MD;  Location: [facility] ENDOSCOPY;  Service: Endoscopy;;   IR ANGIOGRAM SELECTIVE EACH ADDITIONAL VESSEL  04/08/2019  IR EMBO ART  VEN HEMORR LYMPH EXTRAV  INC GUIDE ROADMAPPING  04/08/2019   IR PARACENTESIS  04/08/2019   IR RADIOLOGIST EVAL & MGMT  06/13/2019   IR RADIOLOGIST EVAL & MGMT  09/25/2019   IR RADIOLOGIST EVAL & MGMT  07/07/2020   IR RADIOLOGIST EVAL & MGMT  07/30/2021   IR TIPS  04/08/2019   LAMINECTOMY WITH POSTERIOR LATERAL ARTHRODESIS LEVEL 1 Bilateral 02/25/2022   Procedure: Laminectomy and Foraminotomy - Lumbar four-Lumbar five - bilateral, posterolateral arthrodesis Lumbar four-five, extension of hardware Lumbar four-five;  Surgeon: Tia Alert, MD;  Location: [facility] OR;  Service: Neurosurgery;  Laterality: Bilateral;   MAXIMUM ACCESS (MAS)POSTERIOR LUMBAR INTERBODY FUSION (PLIF) 1 LEVEL Left 06/10/2015   Procedure: FOR MAXIMUM ACCESS (MAS) POSTERIOR LUMBAR INTERBODY FUSION (PLIF) LUMBAR  THREE-FOUR EXTRAFORAMINAL MICRODISCECTOMY LUMBAR FIVE-SACRAL ONE LEFT;  Surgeon: Tia Alert, MD;  Location: [facility] NEURO ORS;  Service: Neurosurgery;  Laterality: Left;   RADIOLOGY WITH ANESTHESIA N/A 04/08/2019   Procedure: RADIOLOGY WITH ANESTHESIA;  Surgeon: Radiologist, Medication, MD;  Location: [facility] OR;  Service: Radiology;  Laterality: N/A;   SCLEROTHERAPY  04/07/2019   Procedure: SCLEROTHERAPY;  Surgeon: Charna Remi, MD;  Location: [facility] ENDOSCOPY;  Service: Endoscopy;;   TEE WITHOUT CARDIOVERSION N/A 01/28/2016   Procedure: TRANSESOPHAGEAL ECHOCARDIOGRAM (TEE);  Surgeon: Loreli Slot, MD;  Location: [facility] OR;  Service: Open Heart Surgery;  Laterality: N/A;   TUBAL LIGATION  1982   Dr Ninetta Lights HERNIA REPAIR N/A 09/10/2019   Procedure: HERNIA REPAIR UMBILICAL ADULT;  Surgeon: Manus Rudd, MD;  Location: [facility] OR;  Service: General;  Laterality: N/A;   VAGINAL HYSTERECTOMY  1997   Dr Vivien Rota   Social History:  reports that she has never smoked. She has never used smokeless tobacco. She reports that she does not drink alcohol and does not use drugs.  Allergies  Allergen Reactions   Kiwi Extract Anaphylaxis   Gabapentin Nausea And Vomiting   Tdap [Tetanus-Diphth-Acell Pertussis] Swelling and Other (See Comments)    Swelling at injection site, gets very hot   Latex Itching, Dermatitis and Rash   Statins Other (See Comments)    RHABDOMYOLYSIS   Tramadol Nausea And Vomiting    Family History  Problem Relation Age of Onset   Heart disease Mother    Breast cancer Mother    Lung cancer Father    Arthritis Sister    Arthritis Brother    Liver cancer Brother    Lymphoma Brother    Heart disease Maternal Grandmother    Heart disease Maternal Grandfather    Heart disease Paternal Grandmother    Heart disease Paternal Grandfather    Other Daughter        house fire   Breast cancer Maternal Aunt    Breast cancer Paternal Aunt    Colon cancer Neg Hx    Esophageal cancer  Neg Hx    Rectal cancer Neg Hx    Stomach cancer Neg Hx     Prior to Admission medications   Medication Sig Start Date End Date Taking? Authorizing Provider  acetaminophen (TYLENOL) 500 MG tablet Take 500 mg by mouth every 6 (six) hours as needed for moderate pain.    [provider]  aspirin EC 81 MG tablet Take 81 mg by mouth daily. Swallow whole.    [provider]  Biotin 31540 MCG TABS Take 10 mg by mouth daily.    [provider]  Calcium Carb-Cholecalciferol (CALCIUM 600 +  D PO) Take 1 tablet by mouth in the morning and at bedtime.    [provider]  Cholecalciferol (VITAMIN D-3) 25 MCG (1000 UT) CAPS Take 1,000 Units by mouth daily.    [provider]  Cyanocobalamin (VITAMIN B 12 PO) Take 1,000 mcg by mouth daily.    [provider]  empagliflozin (JARDIANCE) 25 MG TABS tablet Take 1 tablet (25 mg total) by mouth daily. 03/14/22   Frederica Kuster, MD  ezetimibe (ZETIA) 10 MG tablet Take 1 tablet (10 mg total) by mouth daily. 02/09/22   Frederica Kuster, MD  fish oil-omega-3 fatty acids 1000 MG capsule Take 1 g by mouth daily.    [provider]  fluticasone (FLONASE) 50 MCG/ACT nasal spray Place 1 spray into both nostrils daily as needed for allergies or rhinitis.    [provider]  furosemide (LASIX) 20 MG tablet Take 40 mg by mouth daily.    [provider]  glucose blood test strip One Touch Ultra Blue test strips. Use to test blood sugar three times daily. Dx: E11.65 02/09/22   Frederica Kuster, MD  insulin degludec (TRESIBA FLEXTOUCH) 100 UNIT/ML FlexTouch Pen Inject 20 Units into the skin at bedtime. 08/03/22   Sharon Seller, NP  insulin lispro (HUMALOG) 100 UNIT/ML injection Inject 0-0.12 mLs (0-12 Units total) into the skin See admin instructions. Before each meal 3 times a day, 140-199 - 2 units, 200-250 - 4 units, 251-299 - 6 units,  300-349 - 8 units,  350 or above 10 units. 07/04/22    Leroy Sea, MD  Insulin Pen Needle (B-D ULTRAFINE III SHORT PEN) 31G X 8 MM MISC Use to give Insulin injections daily. Dx: E11.65 10/11/21   Frederica Kuster, MD  Insulin Syringe-Needle U-100 (INSULIN SYRINGE 1CC/31GX5/16") 31G X 5/16" 1 ML MISC USE AS DIRECTED TO INJECT LEVIMIR 07/29/20   Frederica Kuster, MD  LACTOBACILLUS PO Take 1 capsule by mouth daily.    [provider]  lactulose (CHRONULAC) 10 GM/15ML solution TAKE 30 ML BY MOUTH EVERY DAY 11/26/21   Rachael Fee, MD  MAGNESIUM PO Take 400 mg by mouth daily.    [provider]  meclizine (ANTIVERT) 25 MG tablet Take 1 tablet (25 mg total) by mouth 3 (three) times daily as needed for dizziness. 07/04/22   Leroy Sea, MD  melatonin 3 MG TABS tablet Take 3 mg by mouth at bedtime.    [provider]  nystatin cream (MYCOSTATIN) Apply 1 Application topically 2 (two) times daily. Perineum area 07/19/22   Ngetich, Dinah C, NP  ondansetron (ZOFRAN) 4 MG/2ML SOLN injection Inject 4 mg into the vein once.    [provider]  oxyCODONE (OXY IR/ROXICODONE) 5 MG immediate release tablet Take 0.5 tablets (2.5 mg total) by mouth every 6 (six) hours as needed for severe pain. 07/04/22   Leroy Sea, MD  pantoprazole (PROTONIX) 40 MG tablet TAKE 1 TABLET(40 MG) BY MOUTH DAILY 10/25/21   Frederica Kuster, MD  Polyethyl Glycol-Propyl Glycol (SYSTANE OP) Place 1 drop into both eyes at bedtime.    [provider]  potassium chloride (KLOR-CON M) 10 MEQ tablet Take 20 mEq by mouth 2 (two) times daily.    [provider]  rifaximin (XIFAXAN) 550 MG TABS tablet Take 550 mg by mouth 2 (two) times daily.    [provider]    Physical Exam: Vitals:   08/21/22 1245 08/21/22 1330  08/21/22 1350 08/21/22 1445  BP: (!) 124/105 122/62  (!) 130/55  Pulse: 80 77  78  Resp: (!) Temp:   (!) 97.3 F (36.3 C)   SpO2: 100% 97%  97%  Weight:      Height:        Exam  Constitutional: Elderly woman who appears to be in no acute distress at this time. Eyes: PERRL, lids and conjunctivae normal ENMT: Mucous membranes are moist. Posterior pharynx clear of any exudate or lesions.  Neck: normal, supple, no JVD respiratory: clear to auscultation bilaterally, no wheezing, no crackles. Normal respiratory effort. No accessory muscle use.  Cardiovascular: Regular rate and rhythm.  No extremity edema. 2+ pedal pulses.  Abdomen: no abdominal distention, tenderness, no masses palpated.   Bowel sounds positive.  Musculoskeletal: no clubbing / cyanosis. No joint deformity upper and lower extremities. Good ROM, no contractures. Normal muscle tone.  Skin: Open wound on the posterior aspect of the left leg without significant erythema draining serosanguineous fluid surrounding as noted below.  Neurologic: CN 2-12 grossly intact.   Strength 5/5 in all 4.  Psychiatric: Normal judgment and insight. Alert and oriented x 3.  Flat affect mood.   Data Reviewed:  EKG reveals sinus rhythm at 70 bpm with QTc 544  Assessment and Plan: Subdural and subarachnoid hemorrhage secondary to fall at home Acute.  Patient presents after reportedly falling backwards hitting her head this morning.  CT imaging of the head noted acute subdural hemorrhage along the left anterior falx and left tentorial leaflet measuring up to 7 mm in thickness, and a small volume of adjacent acute subarachnoid hemorrhage along the posterior left temporal convexity. -Admit to a progressive bed -Neuro checks -Up with assistance -Add on PT/INR -Hold aspirin and anticoagulation -Follow-up CT scan of the head ordered for in a.m. -Consider physical therapy once medically appropriate -Neurosurgery consulted, will follow-up for any further recommendations  Lumbar transverse process fracture 2/2 to fall at home Patient was also found have acute nondisplaced fractures of of left L1, L2, and L3 transverse  process.  Patient with prior history of lumbar fusion Nonsurgical management recommended. -Oxycodone as needed for pain  Possible UTI Acute.  Family notes patient has had issues with frequent UTIs and had last been on antibiotics couple weeks ago.[name] Kitchen  Urinalysis positive for glucose, nitrites, elevated specific gravity, few bacteria, and 0-5 WBCs. Patient had been given Rocephin IV. -Follow-up urine culture -Continue Rocephin IV  Wound of left leg Open wound of the posterior aspect of the left leg with no significant erythema appreciated at this time draining serosanguineous fluid.  Patient notes this is the same leg  that she had cellulitis previously. -Continue local wound care.  Hypokalemia Acute. Potassium 3.4 -Give Potassium Chloride 40 meq po  Pancytopenia Acute on chronic.  Hemoglobin 11.2 which appears relatively stable from prior, but and platelet count acutely lower than previous. -Recheck CBC tomorrow morning  NASH cirrhosis s/p TIPS Patient without signs of ascites or lower extremity swelling on physical exam.  Prior history of TIPS procedure. -Check INR/PT and  ammonia level -Continue rifaximin, furosemide, and lactulose  Uncontrolled diabetes mellitus type 2, with hyperglycemia Last available hemoglobin A1c 8.8.  Home medication regimen includes Tresiba, Jardiance, and sliding scale of insulin, -Hypoglycemia protocols -Pharmacy substitution of Semglee at 10 units nightly -CBGs before every meal with moderate SSI -Adjust insulin regimen as warranted.  Diastolic congestive heart failure Chronic.  Patient does not have any  significant lower extremity edema and appears euvolemic at this time.  Last EF noted to be 60 to 65% with grade 2 diastolic dysfunction with normal RVSP. 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 -Check ESR in a.m. -General surgery consultation recommended acute issues have stabilized  Lytic lesion of left C6 vertebral body posterior elements Incidental finding on CT unclear if benign or malignant.  Weight loss Patient reports that she is lost approximately 30 pounds over the last couple of months.  Prolonged QT interval QTc 544. -Correct electrolyte abnormality -Avoid QT prolonging medications   DVT prophylaxis: SCDs due to acute bleed. Advance Care Planning:   Code Status: Full Code   Consults: Neurosurgery  Family Communication: Family updated at bedside  Severity of Illness: The appropriate patient status for this patient is INPATIENT. Inpatient status is judged to be reasonable and necessary in order to provide the required intensity of service to ensure the patient's safety. The patient's presenting symptoms, physical exam findings, and initial radiographic and laboratory data in the context of their chronic comorbidities is felt to place them at high risk for further clinical deterioration. Furthermore, it is not anticipated that the patient will be medically stable for discharge from the hospital within 2 midnights of admission.   * I certify that at the point of admission it is my clinical judgment that the patient will require inpatient hospital care spanning beyond 2 midnights from the point of admission due to high intensity of service, high risk for further deterioration and high frequency of surveillance required.*  Author: Clydie Braun, MD 08/21/2022 3:46 PM  For on call review [email or web address].

## 2022-08-21 NOTE — ED Provider Notes (Signed)
[place] EMERGENCY DEPARTMENT AT [facility] Provider Note   CSN: [id number] Arrival date & time: 08/21/22  6438     History  No chief complaint on file.   [name] is a 77 y.o. female with a past medical history significant for Elita Boone cirrhosis status post TI PS, diastolic CHF, chronic edema, type 2 diabetes, hyperlipidemia, anxiety, previous lumbar fusion who presents to the ED after a fall around 3 AM this morning.  Patient states she was walking to the bathroom and fell backwards. Patient is unsure why she fell.  No prodromal symptoms.  Denies chest pain, shortness of breath, dizziness, lightheadedness.  No recent illness.  Patient states she fell backwards on the ground, hitting the posterior aspect of her head and left hip. She admits to left hip pain and a posterior headache.  Patient not currently on any blood thinners.  Patient discharged from rehab on 3/22. Last fall prior to today was in January.  Denies speech changes, visual changes, and unilateral weakness.  History obtained from patient and past medical records. No interpreter used during encounter.       Home Medications Prior to Admission medications   Medication Sig Start Date End Date Taking? Authorizing Provider  acetaminophen (TYLENOL) 500 MG tablet Take 500 mg by mouth every 6 (six) hours as needed for moderate pain.    [provider]  aspirin EC 81 MG tablet Take 81 mg by mouth daily. Swallow whole.    [provider]  Biotin 38184 MCG TABS Take 10 mg by mouth daily.    [provider]  Calcium Carb-Cholecalciferol (CALCIUM 600 + D PO) Take 1 tablet by mouth in the morning and at bedtime.    [provider]  Cholecalciferol (VITAMIN D-3) 25 MCG (1000 UT) CAPS Take 1,000 Units by mouth daily.    [provider]  Cyanocobalamin (VITAMIN B 12 PO) Take 1,000 mcg by mouth daily.    [provider]  empagliflozin (JARDIANCE) 25 MG TABS tablet  Take 1 tablet (25 mg total) by mouth daily. 03/14/22   Frederica Kuster, MD  ezetimibe (ZETIA) 10 MG tablet Take 1 tablet (10 mg total) by mouth daily. 02/09/22   Frederica Kuster, MD  fish oil-omega-3 fatty acids 1000 MG capsule Take 1 g by mouth daily.    [provider]  fluticasone (FLONASE) 50 MCG/ACT nasal spray Place 1 spray into both nostrils daily as needed for allergies or rhinitis.    [provider]  furosemide (LASIX) 20 MG tablet Take 40 mg by mouth daily.    [provider]  glucose blood test strip One Touch Ultra Blue test strips. Use to test blood sugar three times daily. Dx: E11.65 02/09/22   Frederica Kuster, MD  insulin degludec (TRESIBA FLEXTOUCH) 100 UNIT/ML FlexTouch Pen Inject 20 Units into the skin at bedtime. 08/03/22   Sharon Seller, NP  insulin lispro (HUMALOG) 100 UNIT/ML injection Inject 0-0.12 mLs (0-12 Units total) into the skin See admin instructions. Before each meal 3 times a day, 140-199 - 2 units, 200-250 - 4 units, 251-299 - 6 units,  300-349 - 8 units,  350 or above 10 units. 07/04/22   Leroy Sea, MD  Insulin Pen Needle (B-D ULTRAFINE III SHORT PEN) 31G X 8 MM MISC Use to give Insulin injections daily. Dx: E11.65 10/11/21   Frederica Kuster, MD  Insulin Syringe-Needle U-100 (INSULIN SYRINGE 1CC/31GX5/16") 31G X 5/16" 1 ML MISC  USE AS DIRECTED TO INJECT LEVIMIR 07/29/20   Frederica Kuster, MD  LACTOBACILLUS PO Take 1 capsule by mouth daily.    [provider]  lactulose (CHRONULAC) 10 GM/15ML solution TAKE 30 ML BY MOUTH EVERY DAY 11/26/21   Rachael Fee, MD  MAGNESIUM PO Take 400 mg by mouth daily.    [provider]  meclizine (ANTIVERT) 25 MG tablet Take 1 tablet (25 mg total) by mouth 3 (three) times daily as needed for dizziness. 07/04/22   Leroy Sea, MD  melatonin 3 MG TABS tablet Take 3 mg by mouth at bedtime.    [provider]  nystatin cream (MYCOSTATIN) Apply 1 Application  topically 2 (two) times daily. Perineum area 07/19/22   Ngetich, Dinah C, NP  ondansetron (ZOFRAN) 4 MG/2ML SOLN injection Inject 4 mg into the vein once.    [provider]  oxyCODONE (OXY IR/ROXICODONE) 5 MG immediate release tablet Take 0.5 tablets (2.5 mg total) by mouth every 6 (six) hours as needed for severe pain. 07/04/22   Leroy Sea, MD  pantoprazole (PROTONIX) 40 MG tablet TAKE 1 TABLET(40 MG) BY MOUTH DAILY 10/25/21   Frederica Kuster, MD  Polyethyl Glycol-Propyl Glycol (SYSTANE OP) Place 1 drop into both eyes at bedtime.    [provider]  potassium chloride (KLOR-CON M) 10 MEQ tablet Take 20 mEq by mouth 2 (two) times daily.    [provider]  rifaximin (XIFAXAN) 550 MG TABS tablet Take 550 mg by mouth 2 (two) times daily.    [provider]      Allergies    Kiwi extract, Gabapentin, Tdap [tetanus-diphth-acell pertussis], Latex, Statins, and Tramadol    Review of Systems   Review of Systems  Constitutional:  Negative for fever.  Respiratory:  Negative for shortness of breath.   Cardiovascular:  Negative for chest pain.  Musculoskeletal:  Positive for arthralgias.  Neurological:  Positive for headaches. Negative for dizziness and weakness.    Physical Exam Updated Vital Signs BP (!) 130/55 (BP Location: Right Arm)   Pulse 78   Temp (!) 97.3 F (36.3 C)   Resp 17   Ht 5\' 3"  (1.6 m)   Wt 54 kg   LMP  (LMP Unknown)   SpO2 97%   BMI 21.09 kg/m  Physical Exam Vitals and nursing note reviewed.  Constitutional:      General: She is not in acute distress.    Appearance: She is not ill-appearing.  HENT:     Head: Normocephalic.     Comments: Ecchymosis to left posterior scalp. Eyes:     Pupils: Pupils are equal, round, and reactive to light.  Neck:     Comments: No cervical midline tenderness. Cardiovascular:     Rate and Rhythm: Normal rate and regular rhythm.     Pulses: Normal pulses.     Heart sounds: Normal heart  sounds. No murmur heard.    No friction rub. No gallop.  Pulmonary:     Effort: Pulmonary effort is normal.     Breath sounds: Normal breath sounds.  Abdominal:     General: Abdomen is flat. There is no distension.     Palpations: Abdomen is soft.     Tenderness: There is no abdominal tenderness. There is no guarding or rebound.  Musculoskeletal:        General: Normal range of motion.     Cervical back: Neck supple.     Comments: Tenderness to left  hip.  Patient able to lift left lower extremity from bed.  Left lower extremity neurovascularly intact with soft compartments.  Skin:    General: Skin is warm and dry.     Comments: Ecchymosis to posterior left shoulder and left paraspinal region.  Wound on posterior aspect of left calf with purulent drainage and surrounding erythema.  Neurological:     General: No focal deficit present.     Mental Status: She is alert.  Psychiatric:        Mood and Affect: Mood normal.        Behavior: Behavior normal.     ED Results / Procedures / Treatments   Labs (all labs ordered are listed, but only abnormal results are displayed) Labs Reviewed  CBC WITH DIFFERENTIAL/PLATELET - Abnormal; Notable for the following components:      Result Value   RBC 3.64 (*)    Hemoglobin 11.2 (*)    HCT 35.5 (*)    RDW 16.2 (*)    Platelets 134 (*)    All other components within normal limits  URINALYSIS, ROUTINE W REFLEX MICROSCOPIC - Abnormal; Notable for the following components:   Specific Gravity, Urine 1.031 (*)    Glucose, UA >=500 (*)    Nitrite POSITIVE (*)    Bacteria, UA FEW (*)    All other components within normal limits  COMPREHENSIVE METABOLIC PANEL - Abnormal; Notable for the following components:   Potassium 3.4 (*)    Glucose, Bld 188 (*)    Albumin 2.9 (*)    AST 49 (*)    Total Bilirubin 1.3 (*)    All other components within normal limits  URINE CULTURE    EKG EKG Interpretation  Date/Time:  Sunday August 21 2022 12:34:40  EDT Ventricular Rate:  78 PR Interval:  150 QRS Duration: 117 QT Interval:  477 QTC Calculation: 544 R Axis:   -13 Text Interpretation: Sinus rhythm LVH with secondary repolarization abnormality Prolonged QT interval Confirmed by Alvino Blood ([id number]) on 08/21/2022 2:02:40 PM  Radiology CT CHEST ABDOMEN PELVIS W CONTRAST  Result Date: 08/21/2022 CLINICAL DATA:  Fall. EXAM: CT CHEST, ABDOMEN, AND PELVIS WITH CONTRAST TECHNIQUE: Multidetector CT imaging of the chest, abdomen and pelvis was performed following the standard protocol during bolus administration of intravenous contrast. RADIATION DOSE REDUCTION: This exam was performed according to the departmental dose-optimization program which includes automated exposure control, adjustment of the mA and/or kV according to patient size and/or use of iterative reconstruction technique. CONTRAST:  75mL OMNIPAQUE IOHEXOL 350 MG/ML SOLN COMPARISON:  CT abdomen pelvis dated May 28, 2022. CT chest dated August 20, 2019. FINDINGS: CT CHEST FINDINGS Cardiovascular: No significant vascular findings. Mild cardiomegaly. Prior CABG. No pericardial effusion. Mediastinum/Nodes: No enlarged mediastinal, hilar, or axillary lymph nodes. Thyroid gland, trachea, and esophagus demonstrate no significant findings. Lungs/Pleura: No focal consolidation, pleural effusion, or pneumothorax. Musculoskeletal: No acute or significant osseous findings. CT ABDOMEN PELVIS FINDINGS Hepatobiliary: Cirrhotic liver again noted without focal abnormality. Patent TIPS with unchanged focal dilatation of the right hepatic vein just distal to the shunt. Mildly distended gallbladder without radiopaque gallstones or wall thickening. No biliary dilatation. Pancreas: Atrophic. No pancreatic ductal dilatation or surrounding inflammatory changes. Spleen: Unchanged splenomegaly. Adrenals/Urinary Tract: Adrenal glands are unremarkable. Kidneys are normal, without renal calculi, focal lesion, or  hydronephrosis. Bladder is unremarkable. Stomach/Bowel: Stomach is within normal limits. No evidence of bowel wall thickening, distention, or inflammatory changes. Mobile cecum again noted. The mid to distal appendix is dilated  and fluid-filled, measuring up to 2.3 cm in diameter (series 3, image 85), previously 1.9 cm in January 2024, and 1.3 cm in January 2023. No surrounding inflammatory change. Vascular/Lymphatic: Aortic atherosclerosis. Embolization coils in the gastrohepatic ligament again noted. No enlarged abdominal or pelvic lymph nodes. Reproductive: Status post hysterectomy. No adnexal masses. Other: No abdominal wall hernia or abnormality. No abdominopelvic ascites. No pneumoperitoneum. Musculoskeletal: Acute nondisplaced fractures of the left L1, L2, and L3 transverse processes. IMPRESSION: CHEST: 1. No acute traumatic injury. ABDOMEN AND PELVIS: 1. Acute nondisplaced fractures of the left L1-L2, and L3 transverse processes. 2. Cirrhosis with sequelae of portal hypertension. Patent TIPS. 3. Dilated and fluid-filled mid to distal appendix, measuring up to 2.3 cm in diameter, previously 1.9 cm in January. No surrounding inflammatory change. 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 Surgical consultation is recommended. 4.  Aortic Atherosclerosis (ICD10-I70.0). Electronically Signed   By: Obie Dredge M.D.   On: 08/21/2022 15:09   CT Head Wo Contrast  Result Date: 08/21/2022 CLINICAL DATA:  Head trauma, minor (Age >= 65y); Neck trauma (Age >= 65y) EXAM: CT HEAD WITHOUT CONTRAST CT CERVICAL SPINE WITHOUT CONTRAST TECHNIQUE: Multidetector CT imaging of the head and cervical spine was performed following the standard protocol without intravenous contrast. Multiplanar CT image reconstructions of the cervical spine were also generated. RADIATION DOSE REDUCTION: This exam was performed according to the departmental dose-optimization program which includes automated exposure control, adjustment of  the mA and/or kV according to patient size and/or use of iterative reconstruction technique. COMPARISON:  CT head 05/28/2022. FINDINGS: CT HEAD FINDINGS Brain: Acute subdural hematoma along the left anterior falx measuring 3 mm in thickness. Additional acute subdural hemorrhage along the left tentorial leaflet measuring up to 7 mm in thickness. Small volume of adjacent acute subarachnoid hemorrhage along the posterior left temporal convexity. No significant mass effect or midline shift. No evidence of acute large vascular territory infarct, mass lesion or hydrocephalus. Vascular: No hyperdense vessel identified. Skull: No acute fracture. Sinuses/Orbits: Clear sinuses.  No acute orbital findings. Other: No mastoid effusions. CT CERVICAL SPINE FINDINGS Alignment: Reversal the normal cervical lordosis. No substantial sagittal subluxation. No substantial sagittal subluxation. Skull base and vertebrae: Vertebral body heights are maintained. No evidence of acute fracture. Lytic lesion involving the left C6 vertebral body posterior elements. Soft tissues and spinal canal: No prevertebral fluid or swelling. No visible canal hematoma. Disc levels: Multilevel degenerative disease and facet/uncovertebral hypertrophy with varying degrees of neural foraminal stenosis. Upper chest: Visualized lung apices are clear. IMPRESSION: CT head: 1. Acute subdural hemorrhage along the left anterior falx and left tentorial leaflet, measuring up to 7 mm in thickness. 2. Small volume of adjacent acute subarachnoid hemorrhage along the posterior left temporal convexity. 3. No significant mass effect or midline shift. Findings discussed with provider [facility] via telephone at 12:12 p.m. CT cervical spine: 1. No evidence of acute fracture or traumatic malalignment. 2. Lytic lesion involving the left C6 vertebral body posterior elements. This may be a benign lesion such as a vertebral venous malformation; however, a metastasis could have a similar  appearance. If the patient has a known malignancy, recommend MRI of the cervical spine with contrast. Electronically Signed   By: Feliberto Harts M.D.   On: 08/21/2022 12:13   CT Cervical Spine Wo Contrast  Result Date: 08/21/2022 CLINICAL DATA:  Head trauma, minor (Age >= 65y); Neck trauma (Age >= 65y) EXAM: CT HEAD WITHOUT CONTRAST CT CERVICAL SPINE WITHOUT CONTRAST TECHNIQUE: Multidetector CT imaging of  the head and cervical spine was performed following the standard protocol without intravenous contrast. Multiplanar CT image reconstructions of the cervical spine were also generated. RADIATION DOSE REDUCTION: This exam was performed according to the departmental dose-optimization program which includes automated exposure control, adjustment of the mA and/or kV according to patient size and/or use of iterative reconstruction technique. COMPARISON:  CT head 05/28/2022. FINDINGS: CT HEAD FINDINGS Brain: Acute subdural hematoma along the left anterior falx measuring 3 mm in thickness. Additional acute subdural hemorrhage along the left tentorial leaflet measuring up to 7 mm in thickness. Small volume of adjacent acute subarachnoid hemorrhage along the posterior left temporal convexity. No significant mass effect or midline shift. No evidence of acute large vascular territory infarct, mass lesion or hydrocephalus. Vascular: No hyperdense vessel identified. Skull: No acute fracture. Sinuses/Orbits: Clear sinuses.  No acute orbital findings. Other: No mastoid effusions. CT CERVICAL SPINE FINDINGS Alignment: Reversal the normal cervical lordosis. No substantial sagittal subluxation. No substantial sagittal subluxation. Skull base and vertebrae: Vertebral body heights are maintained. No evidence of acute fracture. Lytic lesion involving the left C6 vertebral body posterior elements. Soft tissues and spinal canal: No prevertebral fluid or swelling. No visible canal hematoma. Disc levels: Multilevel degenerative  disease and facet/uncovertebral hypertrophy with varying degrees of neural foraminal stenosis. Upper chest: Visualized lung apices are clear. IMPRESSION: CT head: 1. Acute subdural hemorrhage along the left anterior falx and left tentorial leaflet, measuring up to 7 mm in thickness. 2. Small volume of adjacent acute subarachnoid hemorrhage along the posterior left temporal convexity. 3. No significant mass effect or midline shift. Findings discussed with provider [facility] via telephone at 12:12 p.m. CT cervical spine: 1. No evidence of acute fracture or traumatic malalignment. 2. Lytic lesion involving the left C6 vertebral body posterior elements. This may be a benign lesion such as a vertebral venous malformation; however, a metastasis could have a similar appearance. If the patient has a known malignancy, recommend MRI of the cervical spine with contrast. Electronically Signed   By: Feliberto Harts M.D.   On: 08/21/2022 12:13   DG Pelvis 1-2 Views  Result Date: 08/21/2022 CLINICAL DATA:  Fall. EXAM: PELVIS - 1-2 VIEW COMPARISON:  April 27, 2022. FINDINGS: There is no evidence of pelvic fracture or diastasis. No pelvic bone lesions are seen. IMPRESSION: Negative. Electronically Signed   By: Lupita Raider M.D.   On: 08/21/2022 11:58   DG Chest 1 View  Result Date: 08/21/2022 CLINICAL DATA:  Fall. EXAM: CHEST  1 VIEW COMPARISON:  May 27, 2022. FINDINGS: Stable cardiomediastinal silhouette. Status post coronary bypass graft. Lungs are clear. Bony thorax is unremarkable. IMPRESSION: No active disease. Electronically Signed   By: Lupita Raider M.D.   On: 08/21/2022 11:57   DG Lumbar Spine Complete  Result Date: 08/21/2022 CLINICAL DATA:  Fall. EXAM: LUMBAR SPINE - COMPLETE 4+ VIEW COMPARISON:  July 28, 2022. FINDINGS: No fracture or spondylolisthesis is noted. Status post surgical posterior fusion of L3, L4 and L5. Interbody fusion is noted at L3-4. Moderate degenerative disc disease is noted  at L4-5 and L5-S1. IMPRESSION: Postsurgical and degenerative changes as described above. No acute abnormality seen. Electronically Signed   By: Lupita Raider M.D.   On: 08/21/2022 11:56   DG Tibia/Fibula Left  Result Date: 08/21/2022 CLINICAL DATA:  Fall. EXAM: LEFT TIBIA AND FIBULA - 2 VIEW COMPARISON:  None Available. FINDINGS: There is no evidence of fracture or other focal bone lesions. Soft tissues are  unremarkable. IMPRESSION: Negative. Electronically Signed   By: Lupita Raider M.D.   On: 08/21/2022 11:54    Procedures .Critical Care  Performed by: Mannie Stabile, PA-C Authorized by: Mannie Stabile, PA-C   Critical care provider statement:    Critical care time (minutes):  36   Critical care was necessary to treat or prevent imminent or life-threatening deterioration of the following conditions:  Trauma   Critical care was time spent personally by me on the following activities:  Development of treatment plan with patient or surrogate, discussions with consultants, evaluation of patient's response to treatment, examination of patient, ordering and review of laboratory studies, ordering and review of radiographic studies, ordering and performing treatments and interventions, pulse oximetry, re-evaluation of patient's condition and review of old charts   I assumed direction of critical care for this patient from another provider in my specialty: no     Care discussed with: admitting provider       Medications Ordered in ED Medications  acetaminophen (TYLENOL) tablet 650 mg (has no administration in time range)  cefTRIAXone (ROCEPHIN) 1 g in sodium chloride 0.9 % 100 mL IVPB (has no administration in time range)  iohexol (OMNIPAQUE) 350 MG/ML injection 75 mL (75 mLs Intravenous Contrast Given 08/21/22 1425)    ED Course/ Medical Decision Making/ A&P                             Medical Decision Making Amount and/or Complexity of Data Reviewed Independent Historian:  caregiver and spouse    Details: Daughter in law and husband at bedside providing history External Data Reviewed: notes. Labs: ordered. Decision-making details documented in ED Course. Radiology: ordered and independent interpretation performed. Decision-making details documented in ED Course. ECG/medicine tests: ordered and independent interpretation performed. Decision-making details documented in ED Course.  Risk OTC drugs. Prescription drug management. Decision regarding hospitalization.   This patient presents to the ED for concern of fall, this involves an extensive number of treatment options, and is a complaint that carries with it a high risk of complications and morbidity.  The differential diagnosis includes intracranial hemorrhage, bony fractures, UTI, cardiac arrhythmia, etc  77 year old female presents to the ED after a fall.  Patient states she was walking to the bath around 3 AM and fell backwards striking the posterior aspect of her head.  No LOC.  Not currently on any blood thinners.  Patient is unsure why she fell.  She admits to a posterior headache and left hip pain.  Upon arrival, vitals all within normal limits.  Patient in no acute distress.  Normal speech. AAOx4.  Equal grip strength.  No pronator drift.  Difficulties lifting left leg from bed secondary to hip pain.  Ecchymosis to posterior left shoulder and left lumbar paraspinal region.  Bony tenderness of left hip.  Left lower extremity neurovascularly intact with soft compartments.  Patient able to move all 4 extremities without difficulty. Wound to LLE which patient follows wound care for. CT head and cervical spine ordered.  X-rays to rule out bony fractures.  Routine labs given unknown cause of fall.  Mechanical versus metabolic. No chest pain to suggest cardiac etiology. EKG ordered.   CBC significant for anemia with hemoglobin 11.2.  No leukocytosis. UA positive for nitrites.  Patient started on IV Rocephin.  CMP  significant for hypokalemia at 3.4.  Normal renal function.  12:21 PM discussed CT results with radiology.  CT  head demonstrate a 7 mm acute subdural hemorrhage and small acute subarachnoid hemorrhage.  Will discuss with neurosurgery.  No midline shift.  Patient denies any visual or speech changes.  No unilateral weakness. Head of the bed put up at 30 degrees.  12:38 PM Discussed with Cala Bradford NP with neurosurgery who recommends admitting to medicine. She will see patient. Repeat CT head in the AM.   Given significant ecchymosis to left side of her body will obtain CT chest, abdomen, pelvis to rule out other injuries. Discussed with Dr. Suezanne Jacquet who evaluated patient at bedside and agrees with assessment and plan.   CT cervical spine demonstrates a lytic lesion involving left C6 vertebral body benign lesion versus metastatic disease. Tibia/fibula x-ray negative for signs of osteomyelitis.  Chest, pelvis, lumbar x-ray negative for any bony fractures.  CT chest, abdomen, pelvis demonstrates nondisplaced fractures of L1, L2, L3 TP.  Also demonstrates dilated fluid-filled appendix.  Dr. Cliffton Asters notes no treatment needed for appendix at this time. Not a surgical candidate. Patient is non-tender on exam. Patient can follow-up in outpatient setting.   3:22 PM Discussed with Dr. Cliffton Asters with trauma who declined admission to trauma service given it is only one system ie neurosurgery. Will discuss with hospitalist for admission.   3:51 PM Discussed with Dr. Katrinka Blazing with [facility] who agrees to admit. Recommends reaching back out of neurosurgery to inform about TP fractures. Consult call placed and handed off to oncoming provider.   4:02 PM Spoke to Dr. Maisie Fus with neurosurgery who notes no injuries are operative. Neurosurgery will follow.         Final Clinical Impression(s) / ED Diagnoses Final diagnoses:  Fall, initial encounter  Subdural hemorrhage  SAH (subarachnoid hemorrhage)    Rx / DC Orders ED  Discharge Orders     None         Jesusita Oka 08/21/22 1605    Lonell Grandchild, MD 08/22/22 1032

## 2022-08-21 NOTE — Consult Note (Signed)
Reason for Consult:sdh Referring Physician: EDP  [name] is an 77 y.o. female.   HPI:  77 year old patient presented to the ED today after sustaining a fall this morning. She states that she was getting out of bed when she tripped over some things in the floor and hit her head. Denies any LOC. Endorses some headaches but no vision changed dizziness or NV. Denies any blood thinners. Has an extensive medical history. She also complains of left hip pain.   Past Medical History:  Diagnosis Date   Chronic cystitis    Chronic diastolic CHF (congestive heart failure)    followed by dr Eden Emms   Chronic kidney disease, stage I    Cirrhosis of liver without ascites 03/2016   followed by dr Christella Hartigan (GI);   due to fatty liver;   post op cabg 09/ 2017  , post paracentesis for ascites   Coronary artery disease 01/2016   cardiologist--- dr Estrella Myrtle;   01-27-2016  NSTEMI  s/p cardiac cath;   s/p CABG 01/29/16: LIMA-LAD, SVG-RI, SVG-dRCA   DDD (degenerative disc disease), cervical    per pt w/ chronic neck pain   Dyspnea    occasional   Edema of right lower extremity    Esophageal varices    followed by dr Christella Hartigan;   hx multiple banding of large varices   Essential hypertension    no meds   GERD (gastroesophageal reflux disease)    Heart murmur     DX FOR YEARS ASYMPTOMATIC   Hepatic encephalopathy    History of adenomatous polyp of colon    History of COVID-19 04/2020   per pt mild  symptoms that resolve   History of kidney stones    passed stones   History of non-ST elevation myocardial infarction (NSTEMI) 01/27/2016   Hyperlipidemia    diet controlled   NAFLD (nonalcoholic fatty liver disease)    Neuropathy, peripheral 04/01/2015   OA (osteoarthritis)    Paroxysmal A-fib    08/19/19 afib with RVR, spontaneously converted to SR   Portal vein thrombosis    per dr Christella Hartigan  ,  chronic non-occluded   Thrombocytopenia    followed by pcp  and hematology-- dr Mosetta Putt;   likely secondary  to liver cirrhosis   Type 2 diabetes mellitus treated with insulin    Type 2, followed by pcp    (07-21-2021  per pt checks blood sugar dialy in am fasting average 113--200s)   Urge incontinence of urine    Wears hearing aid in both ears     Past Surgical History:  Procedure Laterality Date   CARDIAC CATHETERIZATION N/A 01/27/2016   Procedure: Left Heart Cath and Coronary Angiography;  Surgeon: Lyn Records, MD;  Location: [facility] INVASIVE CV LAB;  Service: Cardiovascular;  Laterality: N/A;   COLONOSCOPY WITH PROPOFOL N/A 07/07/2016   Procedure: COLONOSCOPY WITH PROPOFOL;  Surgeon: Rachael Fee, MD;  Location: [facility] ENDOSCOPY;  Service: Endoscopy;  Laterality: N/A;   CORONARY ARTERY BYPASS GRAFT N/A 01/28/2016   Procedure: CORONARY ARTERY BYPASS GRAFTING (CABG) x 3 USING RIGHT LEG GREATER SAPHENOUS VEIN GRAFT;  Surgeon: Loreli Slot, MD;  Location: [facility] OR;  Service: Open Heart Surgery;  Laterality: N/A;   CYSTOSCOPY WITH RETROGRADE PYELOGRAM, URETEROSCOPY AND STENT PLACEMENT Left 05/30/2021   Procedure: CYSTOSCOPY WITH RETROGRADE PYELOGRAM, URETEROSCOPY AND STENT PLACEMENT;  Surgeon: Rene Paci, MD;  Location: [facility] OR;  Service: Urology;  Laterality: Left;   CYSTOSCOPY/URETEROSCOPY/HOLMIUM LASER/STENT PLACEMENT Left 07/26/2021  Procedure: CYSTOSCOPY/RETROGRADE/URETEROSCOPY/HOLMIUM LASER/STENT EXCHANGE;  Surgeon: Jannifer Hick, MD;  Location: [facility];  Service: Urology;  Laterality: Left;   ENDOVEIN HARVEST OF GREATER SAPHENOUS VEIN Right 01/28/2016   Procedure: ENDOVEIN HARVEST OF GREATER SAPHENOUS VEIN;  Surgeon: Loreli Slot, MD;  Location: [facility] OR;  Service: Open Heart Surgery;  Laterality: Right;   ESOPHAGEAL BANDING  03/28/2019   Procedure: ESOPHAGEAL BANDING;  Surgeon: Rachael Fee, MD;  Location: [facility] ENDOSCOPY;  Service: Endoscopy;;   ESOPHAGEAL BANDING  04/07/2019   Procedure: ESOPHAGEAL BANDING;  Surgeon: Charna Braylynn, MD;  Location: [facility]  ENDOSCOPY;  Service: Endoscopy;;   ESOPHAGOGASTRODUODENOSCOPY N/A 04/07/2019   Procedure: ESOPHAGOGASTRODUODENOSCOPY (EGD);  Surgeon: Charna Carmell, MD;  Location: [facility] ENDOSCOPY;  Service: Endoscopy;  Laterality: N/A;   ESOPHAGOGASTRODUODENOSCOPY (EGD) WITH PROPOFOL N/A 07/07/2016   Procedure: ESOPHAGOGASTRODUODENOSCOPY (EGD) WITH PROPOFOL;  Surgeon: Rachael Fee, MD;  Location: [facility] ENDOSCOPY;  Service: Endoscopy;  Laterality: N/A;   ESOPHAGOGASTRODUODENOSCOPY (EGD) WITH PROPOFOL N/A 03/28/2019   Procedure: ESOPHAGOGASTRODUODENOSCOPY (EGD) WITH PROPOFOL;  Surgeon: Rachael Fee, MD;  Location: [facility] ENDOSCOPY;  Service: Endoscopy;  Laterality: N/A;   EXTRACORPOREAL SHOCK WAVE LITHOTRIPSY  2010   HEMOSTASIS CLIP PLACEMENT  04/07/2019   Procedure: HEMOSTASIS CLIP PLACEMENT;  Surgeon: Charna Danniella, MD;  Location: [facility] ENDOSCOPY;  Service: Endoscopy;;   IR ANGIOGRAM SELECTIVE EACH ADDITIONAL VESSEL  04/08/2019   IR EMBO ART  VEN HEMORR LYMPH EXTRAV  INC GUIDE ROADMAPPING  04/08/2019   IR PARACENTESIS  04/08/2019   IR RADIOLOGIST EVAL & MGMT  06/13/2019   IR RADIOLOGIST EVAL & MGMT  09/25/2019   IR RADIOLOGIST EVAL & MGMT  07/07/2020   IR RADIOLOGIST EVAL & MGMT  07/30/2021   IR TIPS  04/08/2019   LAMINECTOMY WITH POSTERIOR LATERAL ARTHRODESIS LEVEL 1 Bilateral 02/25/2022   Procedure: Laminectomy and Foraminotomy - Lumbar four-Lumbar five - bilateral, posterolateral arthrodesis Lumbar four-five, extension of hardware Lumbar four-five;  Surgeon: Tia Alert, MD;  Location: [facility] OR;  Service: Neurosurgery;  Laterality: Bilateral;   MAXIMUM ACCESS (MAS)POSTERIOR LUMBAR INTERBODY FUSION (PLIF) 1 LEVEL Left 06/10/2015   Procedure: FOR MAXIMUM ACCESS (MAS) POSTERIOR LUMBAR INTERBODY FUSION (PLIF) LUMBAR THREE-FOUR EXTRAFORAMINAL MICRODISCECTOMY LUMBAR FIVE-SACRAL ONE LEFT;  Surgeon: Tia Alert, MD;  Location: [facility] NEURO ORS;  Service: Neurosurgery;  Laterality: Left;   RADIOLOGY WITH ANESTHESIA N/A  04/08/2019   Procedure: RADIOLOGY WITH ANESTHESIA;  Surgeon: Radiologist, Medication, MD;  Location: [facility] OR;  Service: Radiology;  Laterality: N/A;   SCLEROTHERAPY  04/07/2019   Procedure: SCLEROTHERAPY;  Surgeon: Charna Wyvonne, MD;  Location: [facility] ENDOSCOPY;  Service: Endoscopy;;   TEE WITHOUT CARDIOVERSION N/A 01/28/2016   Procedure: TRANSESOPHAGEAL ECHOCARDIOGRAM (TEE);  Surgeon: Loreli Slot, MD;  Location: [facility] OR;  Service: Open Heart Surgery;  Laterality: N/A;   TUBAL LIGATION  1982   Dr Ninetta Lights HERNIA REPAIR N/A 09/10/2019   Procedure: HERNIA REPAIR UMBILICAL ADULT;  Surgeon: Manus Rudd, MD;  Location: [facility] OR;  Service: General;  Laterality: N/A;   VAGINAL HYSTERECTOMY  1997   Dr Vivien Rota    Allergies  Allergen Reactions   Kiwi Extract Anaphylaxis   Gabapentin Nausea And Vomiting   Tdap [Tetanus-Diphth-Acell Pertussis] Swelling and Other (See Comments)    Swelling at injection site, gets very hot   Latex Itching, Dermatitis and Rash   Statins Other (See Comments)    RHABDOMYOLYSIS   Tramadol Nausea And Vomiting    Social History   Tobacco Use  Smoking status: Never   Smokeless tobacco: Never  Substance Use Topics   Alcohol use: No    Family History  Problem Relation Age of Onset   Heart disease Mother    Breast cancer Mother    Lung cancer Father    Arthritis Sister    Arthritis Brother    Liver cancer Brother    Lymphoma Brother    Heart disease Maternal Grandmother    Heart disease Maternal Grandfather    Heart disease Paternal Grandmother    Heart disease Paternal Grandfather    Other Daughter        house fire   Breast cancer Maternal Aunt    Breast cancer Paternal Aunt    Colon cancer Neg Hx    Esophageal cancer Neg Hx    Rectal cancer Neg Hx    Stomach cancer Neg Hx      Review of Systems  Positive ROS: as above  All other systems have been reviewed and were otherwise negative with the exception of those mentioned in the HPI and  as above.  Objective: Vital signs in last 24 hours: Temp:  [97.3 F (36.3 C)-98.2 F (36.8 C)] 97.3 F (36.3 C) (04/14 1350) Pulse Rate:  [72-80] 80 (04/14 1245) Resp:  [17-21] 21 (04/14 1245) BP: (124-139)/(60-105) 124/105 (04/14 1245) SpO2:  [97 %-100 %] 100 % (04/14 1245) Weight:  [54 kg] 54 kg (04/14 1215)  General Appearance: Alert, cooperative, no distress, appears stated age Head: Normocephalic, without obvious abnormality, atraumatic Eyes: PERRL, conjunctiva/corneas clear, EOM's intact, fundi benign, both eyes      Lungs:  respirations unlabored Heart: Regular rate and rhythm Pulses: 2+ and symmetric all extremities Skin: Skin color, texture, turgor normal, no rashes or lesions  NEUROLOGIC:   Mental status: A&O x4, no aphasia, good attention span, Memory and fund of knowledge Motor Exam - grossly normal, normal tone and bulk Sensory Exam - grossly normal Reflexes: symmetric, no pathologic reflexes, No Hoffman's, No clonus Coordination - not tested Gait - not tested Balance - not tested Cranial Nerves: I: smell Not tested  II: visual acuity  OS: na    OD: na  II: visual fields Full to confrontation  II: pupils Equal, round, reactive to light  III,VII: ptosis None  III,IV,VI: extraocular muscles  Full ROM  V: mastication Normal  V: facial light touch sensation  Normal  V,VII: corneal reflex  Present  VII: facial muscle function - upper  Normal  VII: facial muscle function - lower Normal  VIII: hearing Not tested  IX: soft palate elevation  Normal  IX,X: gag reflex Present  XI: trapezius strength  5/5  XI: sternocleidomastoid strength 5/5  XI: neck flexion strength  5/5  XII: tongue strength  Normal    Data Review Lab Results  Component Value Date   WBC 4.3 08/21/2022   HGB 11.2 (L) 08/21/2022   HCT 35.5 (L) 08/21/2022   MCV 97.5 08/21/2022   PLT 134 (L) 08/21/2022   Lab Results  Component Value Date   NA 138 08/21/2022   K 3.4 (L) 08/21/2022   CL  103 08/21/2022   CO2 23 08/21/2022   BUN 18 08/21/2022   CREATININE 0.69 08/21/2022   GLUCOSE 188 (H) 08/21/2022   Lab Results  Component Value Date   INR 1.2 07/01/2022    Radiology: CT Head Wo Contrast  Result Date: 08/21/2022 CLINICAL DATA:  Head trauma, minor (Age >= 65y); Neck trauma (Age >= 65y) EXAM: CT HEAD  WITHOUT CONTRAST CT CERVICAL SPINE WITHOUT CONTRAST TECHNIQUE: Multidetector CT imaging of the head and cervical spine was performed following the standard protocol without intravenous contrast. Multiplanar CT image reconstructions of the cervical spine were also generated. RADIATION DOSE REDUCTION: This exam was performed according to the departmental dose-optimization program which includes automated exposure control, adjustment of the mA and/or kV according to patient size and/or use of iterative reconstruction technique. COMPARISON:  CT head 05/28/2022. FINDINGS: CT HEAD FINDINGS Brain: Acute subdural hematoma along the left anterior falx measuring 3 mm in thickness. Additional acute subdural hemorrhage along the left tentorial leaflet measuring up to 7 mm in thickness. Small volume of adjacent acute subarachnoid hemorrhage along the posterior left temporal convexity. No significant mass effect or midline shift. No evidence of acute large vascular territory infarct, mass lesion or hydrocephalus. Vascular: No hyperdense vessel identified. Skull: No acute fracture. Sinuses/Orbits: Clear sinuses.  No acute orbital findings. Other: No mastoid effusions. CT CERVICAL SPINE FINDINGS Alignment: Reversal the normal cervical lordosis. No substantial sagittal subluxation. No substantial sagittal subluxation. Skull base and vertebrae: Vertebral body heights are maintained. No evidence of acute fracture. Lytic lesion involving the left C6 vertebral body posterior elements. Soft tissues and spinal canal: No prevertebral fluid or swelling. No visible canal hematoma. Disc levels: Multilevel degenerative  disease and facet/uncovertebral hypertrophy with varying degrees of neural foraminal stenosis. Upper chest: Visualized lung apices are clear. IMPRESSION: CT head: 1. Acute subdural hemorrhage along the left anterior falx and left tentorial leaflet, measuring up to 7 mm in thickness. 2. Small volume of adjacent acute subarachnoid hemorrhage along the posterior left temporal convexity. 3. No significant mass effect or midline shift. Findings discussed with provider [facility] via telephone at 12:12 p.m. CT cervical spine: 1. No evidence of acute fracture or traumatic malalignment. 2. Lytic lesion involving the left C6 vertebral body posterior elements. This may be a benign lesion such as a vertebral venous malformation; however, a metastasis could have a similar appearance. If the patient has a known malignancy, recommend MRI of the cervical spine with contrast. Electronically Signed   By: Feliberto Harts M.D.   On: 08/21/2022 12:13   CT Cervical Spine Wo Contrast  Result Date: 08/21/2022 CLINICAL DATA:  Head trauma, minor (Age >= 65y); Neck trauma (Age >= 65y) EXAM: CT HEAD WITHOUT CONTRAST CT CERVICAL SPINE WITHOUT CONTRAST TECHNIQUE: Multidetector CT imaging of the head and cervical spine was performed following the standard protocol without intravenous contrast. Multiplanar CT image reconstructions of the cervical spine were also generated. RADIATION DOSE REDUCTION: This exam was performed according to the departmental dose-optimization program which includes automated exposure control, adjustment of the mA and/or kV according to patient size and/or use of iterative reconstruction technique. COMPARISON:  CT head 05/28/2022. FINDINGS: CT HEAD FINDINGS Brain: Acute subdural hematoma along the left anterior falx measuring 3 mm in thickness. Additional acute subdural hemorrhage along the left tentorial leaflet measuring up to 7 mm in thickness. Small volume of adjacent acute subarachnoid hemorrhage along the  posterior left temporal convexity. No significant mass effect or midline shift. No evidence of acute large vascular territory infarct, mass lesion or hydrocephalus. Vascular: No hyperdense vessel identified. Skull: No acute fracture. Sinuses/Orbits: Clear sinuses.  No acute orbital findings. Other: No mastoid effusions. CT CERVICAL SPINE FINDINGS Alignment: Reversal the normal cervical lordosis. No substantial sagittal subluxation. No substantial sagittal subluxation. Skull base and vertebrae: Vertebral body heights are maintained. No evidence of acute fracture. Lytic lesion involving the left C6 vertebral  body posterior elements. Soft tissues and spinal canal: No prevertebral fluid or swelling. No visible canal hematoma. Disc levels: Multilevel degenerative disease and facet/uncovertebral hypertrophy with varying degrees of neural foraminal stenosis. Upper chest: Visualized lung apices are clear. IMPRESSION: CT head: 1. Acute subdural hemorrhage along the left anterior falx and left tentorial leaflet, measuring up to 7 mm in thickness. 2. Small volume of adjacent acute subarachnoid hemorrhage along the posterior left temporal convexity. 3. No significant mass effect or midline shift. Findings discussed with provider [facility] via telephone at 12:12 p.m. CT cervical spine: 1. No evidence of acute fracture or traumatic malalignment. 2. Lytic lesion involving the left C6 vertebral body posterior elements. This may be a benign lesion such as a vertebral venous malformation; however, a metastasis could have a similar appearance. If the patient has a known malignancy, recommend MRI of the cervical spine with contrast. Electronically Signed   By: Feliberto Harts M.D.   On: 08/21/2022 12:13   DG Pelvis 1-2 Views  Result Date: 08/21/2022 CLINICAL DATA:  Fall. EXAM: PELVIS - 1-2 VIEW COMPARISON:  April 27, 2022. FINDINGS: There is no evidence of pelvic fracture or diastasis. No pelvic bone lesions are seen.  IMPRESSION: Negative. Electronically Signed   By: Lupita Raider M.D.   On: 08/21/2022 11:58   DG Chest 1 View  Result Date: 08/21/2022 CLINICAL DATA:  Fall. EXAM: CHEST  1 VIEW COMPARISON:  May 27, 2022. FINDINGS: Stable cardiomediastinal silhouette. Status post coronary bypass graft. Lungs are clear. Bony thorax is unremarkable. IMPRESSION: No active disease. Electronically Signed   By: Lupita Raider M.D.   On: 08/21/2022 11:57   DG Lumbar Spine Complete  Result Date: 08/21/2022 CLINICAL DATA:  Fall. EXAM: LUMBAR SPINE - COMPLETE 4+ VIEW COMPARISON:  July 28, 2022. FINDINGS: No fracture or spondylolisthesis is noted. Status post surgical posterior fusion of L3, L4 and L5. Interbody fusion is noted at L3-4. Moderate degenerative disc disease is noted at L4-5 and L5-S1. IMPRESSION: Postsurgical and degenerative changes as described above. No acute abnormality seen. Electronically Signed   By: Lupita Raider M.D.   On: 08/21/2022 11:56   DG Tibia/Fibula Left  Result Date: 08/21/2022 CLINICAL DATA:  Fall. EXAM: LEFT TIBIA AND FIBULA - 2 VIEW COMPARISON:  None Available. FINDINGS: There is no evidence of fracture or other focal bone lesions. Soft tissues are unremarkable. IMPRESSION: Negative. Electronically Signed   By: Lupita Raider M.D.   On: 08/21/2022 11:54    Assessment/Plan: 77 year old female after a fall at home. CT head shows a left sided SDH along the tentorium and anterior falx along with a small left temporal lobe tSAH. No surgical intervention is needed at this point. Recommend follow up head CT tomorrow morning. Plan of care discussed with Dr. Yetta Barre and patient assessed with him at the bedside.   Tiana Loft Vadim Centola 08/21/2022 2:07 PM

## 2022-08-21 NOTE — ED Triage Notes (Signed)
Patient reports that she was going to bathroom last night and fell. States unsure why but just recovering from previous fall. Complains of knots on head, no blood thinner and left hip pain. Ambulatory but reports worse hip pain. No loc

## 2022-08-21 NOTE — ED Notes (Signed)
ED TO INPATIENT HANDOFF REPORT  ED Nurse Name and Phone #: [phone]  S Name/Age/Gender [name] 77 y.o. female Room/Bed: 022C/022C  Code Status   Code Status: Full Code  Home/SNF/Other Home Patient oriented to: self, place, time, and situation Is this baseline? No   Triage Complete: Triage complete  Chief Complaint Traumatic subdural hematoma [S06.5XAA]  Triage Note Patient reports that she was going to bathroom last night and fell. States unsure why but just recovering from previous fall. Complains of knots on head, no blood thinner and left hip pain. Ambulatory but reports worse hip pain. No loc   Allergies Allergies  Allergen Reactions   Kiwi Extract Anaphylaxis   Gabapentin Nausea And Vomiting   Tdap [Tetanus-Diphth-Acell Pertussis] Swelling and Other (See Comments)    Swelling at injection site, gets very hot   Latex Itching, Dermatitis and Rash   Statins Other (See Comments)    RHABDOMYOLYSIS   Tramadol Nausea And Vomiting    Level of Care/Admitting Diagnosis ED Disposition     ED Disposition  Admit   Condition  --   Comment  Hospital Area: [facility] [[id number]]  Level of Care: Progressive [102]  Admit to Progressive based on following criteria: NEUROLOGICAL AND NEUROSURGICAL complex patients with significant risk of instability, who do not meet ICU criteria, yet require close observation or frequent assessment (< / = every 2 - 4 hours) with medical / nursing intervention.  May admit patient to [facility] or [facility] if equivalent level of care is available:: No  Covid Evaluation: Asymptomatic - no recent exposure (last 10 days) testing not required  Diagnosis: Traumatic subdural hematoma [119147]  Admitting Physician: Clydie Braun [[id number]]  Attending Physician: Clydie Braun [[id number]]  Certification:: I certify this patient will need inpatient services for at least 2 midnights  Estimated Length of Stay: 2           B Medical/Surgery History Past Medical History:  Diagnosis Date   Chronic cystitis    Chronic diastolic CHF (congestive heart failure)    followed by dr Eden Emms   Chronic kidney disease, stage I    Cirrhosis of liver without ascites 03/2016   followed by dr Christella Hartigan (GI);   due to fatty liver;   post op cabg 09/ 2017  , post paracentesis for ascites   Coronary artery disease 01/2016   cardiologist--- dr Estrella Myrtle;   01-27-2016  NSTEMI  s/p cardiac cath;   s/p CABG 01/29/16: LIMA-LAD, SVG-RI, SVG-dRCA   DDD (degenerative disc disease), cervical    per pt w/ chronic neck pain   Dyspnea    occasional   Edema of right lower extremity    Esophageal varices    followed by dr Christella Hartigan;   hx multiple banding of large varices   Essential hypertension    no meds   GERD (gastroesophageal reflux disease)    Heart murmur     DX FOR YEARS ASYMPTOMATIC   Hepatic encephalopathy    History of adenomatous polyp of colon    History of COVID-19 04/2020   per pt mild  symptoms that resolve   History of kidney stones    passed stones   History of non-ST elevation myocardial infarction (NSTEMI) 01/27/2016   Hyperlipidemia    diet controlled   NAFLD (nonalcoholic fatty liver disease)    Neuropathy, peripheral 04/01/2015   OA (osteoarthritis)    Paroxysmal A-fib    08/19/19 afib with RVR, spontaneously converted to SR  Portal vein thrombosis    per dr Christella Hartigan  ,  chronic non-occluded   Thrombocytopenia    followed by pcp  and hematology-- dr Mosetta Putt;   likely secondary to liver cirrhosis   Type 2 diabetes mellitus treated with insulin    Type 2, followed by pcp    (07-21-2021  per pt checks blood sugar dialy in am fasting average 113--200s)   Urge incontinence of urine    Wears hearing aid in both ears    Past Surgical History:  Procedure Laterality Date   CARDIAC CATHETERIZATION N/A 01/27/2016   Procedure: Left Heart Cath and Coronary Angiography;  Surgeon: Lyn Records, MD;  Location: [facility]  INVASIVE CV LAB;  Service: Cardiovascular;  Laterality: N/A;   COLONOSCOPY WITH PROPOFOL N/A 07/07/2016   Procedure: COLONOSCOPY WITH PROPOFOL;  Surgeon: Rachael Fee, MD;  Location: [facility] ENDOSCOPY;  Service: Endoscopy;  Laterality: N/A;   CORONARY ARTERY BYPASS GRAFT N/A 01/28/2016   Procedure: CORONARY ARTERY BYPASS GRAFTING (CABG) x 3 USING RIGHT LEG GREATER SAPHENOUS VEIN GRAFT;  Surgeon: Loreli Slot, MD;  Location: [facility] OR;  Service: Open Heart Surgery;  Laterality: N/A;   CYSTOSCOPY WITH RETROGRADE PYELOGRAM, URETEROSCOPY AND STENT PLACEMENT Left 05/30/2021   Procedure: CYSTOSCOPY WITH RETROGRADE PYELOGRAM, URETEROSCOPY AND STENT PLACEMENT;  Surgeon: Rene Paci, MD;  Location: [facility] OR;  Service: Urology;  Laterality: Left;   CYSTOSCOPY/URETEROSCOPY/HOLMIUM LASER/STENT PLACEMENT Left 07/26/2021   Procedure: CYSTOSCOPY/RETROGRADE/URETEROSCOPY/HOLMIUM LASER/STENT EXCHANGE;  Surgeon: Jannifer Hick, MD;  Location: [facility];  Service: Urology;  Laterality: Left;   ENDOVEIN HARVEST OF GREATER SAPHENOUS VEIN Right 01/28/2016   Procedure: ENDOVEIN HARVEST OF GREATER SAPHENOUS VEIN;  Surgeon: Loreli Slot, MD;  Location: [facility] OR;  Service: Open Heart Surgery;  Laterality: Right;   ESOPHAGEAL BANDING  03/28/2019   Procedure: ESOPHAGEAL BANDING;  Surgeon: Rachael Fee, MD;  Location: [facility] ENDOSCOPY;  Service: Endoscopy;;   ESOPHAGEAL BANDING  04/07/2019   Procedure: ESOPHAGEAL BANDING;  Surgeon: Charna Toluwanimi, MD;  Location: [facility] ENDOSCOPY;  Service: Endoscopy;;   ESOPHAGOGASTRODUODENOSCOPY N/A 04/07/2019   Procedure: ESOPHAGOGASTRODUODENOSCOPY (EGD);  Surgeon: Charna Breiona, MD;  Location: [facility] ENDOSCOPY;  Service: Endoscopy;  Laterality: N/A;   ESOPHAGOGASTRODUODENOSCOPY (EGD) WITH PROPOFOL N/A 07/07/2016   Procedure: ESOPHAGOGASTRODUODENOSCOPY (EGD) WITH PROPOFOL;  Surgeon: Rachael Fee, MD;  Location: [facility] ENDOSCOPY;  Service: Endoscopy;  Laterality: N/A;    ESOPHAGOGASTRODUODENOSCOPY (EGD) WITH PROPOFOL N/A 03/28/2019   Procedure: ESOPHAGOGASTRODUODENOSCOPY (EGD) WITH PROPOFOL;  Surgeon: Rachael Fee, MD;  Location: [facility] ENDOSCOPY;  Service: Endoscopy;  Laterality: N/A;   EXTRACORPOREAL SHOCK WAVE LITHOTRIPSY  2010   HEMOSTASIS CLIP PLACEMENT  04/07/2019   Procedure: HEMOSTASIS CLIP PLACEMENT;  Surgeon: Charna Karely, MD;  Location: [facility] ENDOSCOPY;  Service: Endoscopy;;   IR ANGIOGRAM SELECTIVE EACH ADDITIONAL VESSEL  04/08/2019   IR EMBO ART  VEN HEMORR LYMPH EXTRAV  INC GUIDE ROADMAPPING  04/08/2019   IR PARACENTESIS  04/08/2019   IR RADIOLOGIST EVAL & MGMT  06/13/2019   IR RADIOLOGIST EVAL & MGMT  09/25/2019   IR RADIOLOGIST EVAL & MGMT  07/07/2020   IR RADIOLOGIST EVAL & MGMT  07/30/2021   IR TIPS  04/08/2019   LAMINECTOMY WITH POSTERIOR LATERAL ARTHRODESIS LEVEL 1 Bilateral 02/25/2022   Procedure: Laminectomy and Foraminotomy - Lumbar four-Lumbar five - bilateral, posterolateral arthrodesis Lumbar four-five, extension of hardware Lumbar four-five;  Surgeon: Tia Alert, MD;  Location: [facility] OR;  Service: Neurosurgery;  Laterality: Bilateral;   MAXIMUM ACCESS (  MAS)POSTERIOR LUMBAR INTERBODY FUSION (PLIF) 1 LEVEL Left 06/10/2015   Procedure: FOR MAXIMUM ACCESS (MAS) POSTERIOR LUMBAR INTERBODY FUSION (PLIF) LUMBAR THREE-FOUR EXTRAFORAMINAL MICRODISCECTOMY LUMBAR FIVE-SACRAL ONE LEFT;  Surgeon: Tia Alert, MD;  Location: [facility] NEURO ORS;  Service: Neurosurgery;  Laterality: Left;   RADIOLOGY WITH ANESTHESIA N/A 04/08/2019   Procedure: RADIOLOGY WITH ANESTHESIA;  Surgeon: Radiologist, Medication, MD;  Location: [facility] OR;  Service: Radiology;  Laterality: N/A;   SCLEROTHERAPY  04/07/2019   Procedure: SCLEROTHERAPY;  Surgeon: Charna Enrika, MD;  Location: [facility] ENDOSCOPY;  Service: Endoscopy;;   TEE WITHOUT CARDIOVERSION N/A 01/28/2016   Procedure: TRANSESOPHAGEAL ECHOCARDIOGRAM (TEE);  Surgeon: Loreli Slot, MD;  Location: [facility] OR;  Service: Open  Heart Surgery;  Laterality: N/A;   TUBAL LIGATION  1982   Dr Ninetta Lights HERNIA REPAIR N/A 09/10/2019   Procedure: HERNIA REPAIR UMBILICAL ADULT;  Surgeon: Manus Rudd, MD;  Location: [facility] OR;  Service: General;  Laterality: N/A;   VAGINAL HYSTERECTOMY  1997   Dr Odelia Gage IV Location/Drains/Wounds Patient Lines/Drains/Airways Status     Active Line/Drains/Airways     Name Placement date Placement time Site Days   Peripheral IV 08/21/22 20 G Anterior;Proximal;Right Forearm 08/21/22  1544  Forearm  less than 1   Wound / Incision (Open or Dehisced) 05/30/21 Other (Comment) Buttocks Right scabbed. 05/30/21  0120  Buttocks  448            Intake/Output Last 24 hours No intake or output data in the 24 hours ending 08/21/22 1610  Labs/Imaging Results for orders placed or performed during the hospital encounter of 08/21/22 (from the past 48 hour(s))  CBC with Differential     Status: Abnormal   Collection Time: 08/21/22 12:11 PM  Result Value Ref Range   WBC 4.3 4.0 - 10.5 K/uL   RBC 3.64 (L) 3.87 - 5.11 MIL/uL   Hemoglobin 11.2 (L) 12.0 - 15.0 g/dL   HCT 75.6 (L) 43.3 - 29.5 %   MCV 97.5 80.0 - 100.0 fL   MCH 30.8 26.0 - 34.0 pg   MCHC 31.5 30.0 - 36.0 g/dL   RDW 18.8 (H) 41.6 - 60.6 %   Platelets 134 (L) 150 - 400 K/uL   nRBC 0.0 0.0 - 0.2 %   Neutrophils Relative % 70 %   Neutro Abs 3.0 1.7 - 7.7 K/uL   Lymphocytes Relative 17 %   Lymphs Abs 0.7 0.7 - 4.0 K/uL   Monocytes Relative 11 %   Monocytes Absolute 0.5 0.1 - 1.0 K/uL   Eosinophils Relative 1 %   Eosinophils Absolute 0.1 0.0 - 0.5 K/uL   Basophils Relative 1 %   Basophils Absolute 0.0 0.0 - 0.1 K/uL   Immature Granulocytes 0 %   Abs Immature Granulocytes 0.01 0.00 - 0.07 K/uL    Comment: Performed at [facility], [address], [place], [place] [address]  Urinalysis, Routine w reflex microscopic -Urine, Clean Catch     Status: Abnormal   Collection Time: 08/21/22 12:11 PM  Result Value  Ref Range   Color, Urine YELLOW YELLOW   APPearance CLEAR CLEAR   Specific Gravity, Urine 1.031 (H) 1.005 - 1.030   pH 5.0 5.0 - 8.0   Glucose, UA >=500 (A) NEGATIVE mg/dL   Hgb urine dipstick NEGATIVE NEGATIVE   Bilirubin Urine NEGATIVE NEGATIVE   Ketones, ur NEGATIVE NEGATIVE mg/dL   Protein, ur NEGATIVE NEGATIVE mg/dL   Nitrite POSITIVE (A)  NEGATIVE   Leukocytes,Ua NEGATIVE NEGATIVE   RBC / HPF 0-5 0 - 5 RBC/hpf   WBC, UA 0-5 0 - 5 WBC/hpf   Bacteria, UA FEW (A) NONE SEEN   Squamous Epithelial / HPF 0-5 0 - 5 /HPF   Mucus PRESENT     Comment: Performed at [facility], [address] [place], [place] [address]  Comprehensive metabolic panel     Status: Abnormal   Collection Time: 08/21/22 12:11 PM  Result Value Ref Range   Sodium 138 135 - 145 mmol/L   Potassium 3.4 (L) 3.5 - 5.1 mmol/L   Chloride 103 98 - 111 mmol/L   CO2 23 22 - 32 mmol/L   Glucose, Bld 188 (H) 70 - 99 mg/dL    Comment: Glucose reference range applies only to samples taken after fasting for at least 8 hours.   BUN 18 8 - 23 mg/dL   Creatinine, Ser 6.04 0.44 - 1.00 mg/dL   Calcium 9.6 8.9 - 54.0 mg/dL   Total Protein 7.1 6.5 - 8.1 g/dL   Albumin 2.9 (L) 3.5 - 5.0 g/dL   AST 49 (H) 15 - 41 U/L   ALT 42 0 - 44 U/L   Alkaline Phosphatase 87 38 - 126 U/L   Total Bilirubin 1.3 (H) 0.3 - 1.2 mg/dL   GFR, Estimated >98 >11 mL/min    Comment: (NOTE) Calculated using the CKD-EPI Creatinine Equation (2021)    Anion gap 12 5 - 15    Comment: Performed at [facility], [address] [place], [place] [address]   *Note: Due to a large number of results and/or encounters for the requested time period, some results have not been displayed. A complete set of results can be found in Results Review.   CT CHEST ABDOMEN PELVIS W CONTRAST  Result Date: 08/21/2022 CLINICAL DATA:  Fall. EXAM: CT CHEST, ABDOMEN, AND PELVIS WITH CONTRAST TECHNIQUE: Multidetector CT imaging of the chest, abdomen and pelvis was  performed following the standard protocol during bolus administration of intravenous contrast. RADIATION DOSE REDUCTION: This exam was performed according to the departmental dose-optimization program which includes automated exposure control, adjustment of the mA and/or kV according to patient size and/or use of iterative reconstruction technique. CONTRAST:  75mL OMNIPAQUE IOHEXOL 350 MG/ML SOLN COMPARISON:  CT abdomen pelvis dated May 28, 2022. CT chest dated August 20, 2019. FINDINGS: CT CHEST FINDINGS Cardiovascular: No significant vascular findings. Mild cardiomegaly. Prior CABG. No pericardial effusion. Mediastinum/Nodes: No enlarged mediastinal, hilar, or axillary lymph nodes. Thyroid gland, trachea, and esophagus demonstrate no significant findings. Lungs/Pleura: No focal consolidation, pleural effusion, or pneumothorax. Musculoskeletal: No acute or significant osseous findings. CT ABDOMEN PELVIS FINDINGS Hepatobiliary: Cirrhotic liver again noted without focal abnormality. Patent TIPS with unchanged focal dilatation of the right hepatic vein just distal to the shunt. Mildly distended gallbladder without radiopaque gallstones or wall thickening. No biliary dilatation. Pancreas: Atrophic. No pancreatic ductal dilatation or surrounding inflammatory changes. Spleen: Unchanged splenomegaly. Adrenals/Urinary Tract: Adrenal glands are unremarkable. Kidneys are normal, without renal calculi, focal lesion, or hydronephrosis. Bladder is unremarkable. Stomach/Bowel: Stomach is within normal limits. No evidence of bowel wall thickening, distention, or inflammatory changes. Mobile cecum again noted. The mid to distal appendix is dilated and fluid-filled, measuring up to 2.3 cm in diameter (series 3, image 85), previously 1.9 cm in January 2024, and 1.3 cm in January 2023. No surrounding inflammatory change. Vascular/Lymphatic: Aortic atherosclerosis. Embolization coils in the gastrohepatic ligament again noted.  No  enlarged abdominal or pelvic lymph nodes. Reproductive: Status post hysterectomy. No adnexal masses. Other: No abdominal wall hernia or abnormality. No abdominopelvic ascites. No pneumoperitoneum. Musculoskeletal: Acute nondisplaced fractures of the left L1, L2, and L3 transverse processes. IMPRESSION: CHEST: 1. No acute traumatic injury. ABDOMEN AND PELVIS: 1. Acute nondisplaced fractures of the left L1-L2, and L3 transverse processes. 2. Cirrhosis with sequelae of portal hypertension. Patent TIPS. 3. Dilated and fluid-filled mid to distal appendix, measuring up to 2.3 cm in diameter, previously 1.9 cm in January. No surrounding inflammatory change. 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 Surgical consultation is recommended. 4.  Aortic Atherosclerosis (ICD10-I70.0). Electronically Signed   By: Obie Dredge M.D.   On: 08/21/2022 15:09   CT Head Wo Contrast  Result Date: 08/21/2022 CLINICAL DATA:  Head trauma, minor (Age >= 65y); Neck trauma (Age >= 65y) EXAM: CT HEAD WITHOUT CONTRAST CT CERVICAL SPINE WITHOUT CONTRAST TECHNIQUE: Multidetector CT imaging of the head and cervical spine was performed following the standard protocol without intravenous contrast. Multiplanar CT image reconstructions of the cervical spine were also generated. RADIATION DOSE REDUCTION: This exam was performed according to the departmental dose-optimization program which includes automated exposure control, adjustment of the mA and/or kV according to patient size and/or use of iterative reconstruction technique. COMPARISON:  CT head 05/28/2022. FINDINGS: CT HEAD FINDINGS Brain: Acute subdural hematoma along the left anterior falx measuring 3 mm in thickness. Additional acute subdural hemorrhage along the left tentorial leaflet measuring up to 7 mm in thickness. Small volume of adjacent acute subarachnoid hemorrhage along the posterior left temporal convexity. No significant mass effect or midline shift. No evidence of  acute large vascular territory infarct, mass lesion or hydrocephalus. Vascular: No hyperdense vessel identified. Skull: No acute fracture. Sinuses/Orbits: Clear sinuses.  No acute orbital findings. Other: No mastoid effusions. CT CERVICAL SPINE FINDINGS Alignment: Reversal the normal cervical lordosis. No substantial sagittal subluxation. No substantial sagittal subluxation. Skull base and vertebrae: Vertebral body heights are maintained. No evidence of acute fracture. Lytic lesion involving the left C6 vertebral body posterior elements. Soft tissues and spinal canal: No prevertebral fluid or swelling. No visible canal hematoma. Disc levels: Multilevel degenerative disease and facet/uncovertebral hypertrophy with varying degrees of neural foraminal stenosis. Upper chest: Visualized lung apices are clear. IMPRESSION: CT head: 1. Acute subdural hemorrhage along the left anterior falx and left tentorial leaflet, measuring up to 7 mm in thickness. 2. Small volume of adjacent acute subarachnoid hemorrhage along the posterior left temporal convexity. 3. No significant mass effect or midline shift. Findings discussed with provider [facility] via telephone at 12:12 p.m. CT cervical spine: 1. No evidence of acute fracture or traumatic malalignment. 2. Lytic lesion involving the left C6 vertebral body posterior elements. This may be a benign lesion such as a vertebral venous malformation; however, a metastasis could have a similar appearance. If the patient has a known malignancy, recommend MRI of the cervical spine with contrast. Electronically Signed   By: Feliberto Harts M.D.   On: 08/21/2022 12:13   CT Cervical Spine Wo Contrast  Result Date: 08/21/2022 CLINICAL DATA:  Head trauma, minor (Age >= 65y); Neck trauma (Age >= 65y) EXAM: CT HEAD WITHOUT CONTRAST CT CERVICAL SPINE WITHOUT CONTRAST TECHNIQUE: Multidetector CT imaging of the head and cervical spine was performed following the standard protocol without  intravenous contrast. Multiplanar CT image reconstructions of the cervical spine were also generated. RADIATION DOSE REDUCTION: This exam was performed according to the departmental dose-optimization program which  includes automated exposure control, adjustment of the mA and/or kV according to patient size and/or use of iterative reconstruction technique. COMPARISON:  CT head 05/28/2022. FINDINGS: CT HEAD FINDINGS Brain: Acute subdural hematoma along the left anterior falx measuring 3 mm in thickness. Additional acute subdural hemorrhage along the left tentorial leaflet measuring up to 7 mm in thickness. Small volume of adjacent acute subarachnoid hemorrhage along the posterior left temporal convexity. No significant mass effect or midline shift. No evidence of acute large vascular territory infarct, mass lesion or hydrocephalus. Vascular: No hyperdense vessel identified. Skull: No acute fracture. Sinuses/Orbits: Clear sinuses.  No acute orbital findings. Other: No mastoid effusions. CT CERVICAL SPINE FINDINGS Alignment: Reversal the normal cervical lordosis. No substantial sagittal subluxation. No substantial sagittal subluxation. Skull base and vertebrae: Vertebral body heights are maintained. No evidence of acute fracture. Lytic lesion involving the left C6 vertebral body posterior elements. Soft tissues and spinal canal: No prevertebral fluid or swelling. No visible canal hematoma. Disc levels: Multilevel degenerative disease and facet/uncovertebral hypertrophy with varying degrees of neural foraminal stenosis. Upper chest: Visualized lung apices are clear. IMPRESSION: CT head: 1. Acute subdural hemorrhage along the left anterior falx and left tentorial leaflet, measuring up to 7 mm in thickness. 2. Small volume of adjacent acute subarachnoid hemorrhage along the posterior left temporal convexity. 3. No significant mass effect or midline shift. Findings discussed with provider [facility] via telephone at 12:12 p.m.  CT cervical spine: 1. No evidence of acute fracture or traumatic malalignment. 2. Lytic lesion involving the left C6 vertebral body posterior elements. This may be a benign lesion such as a vertebral venous malformation; however, a metastasis could have a similar appearance. If the patient has a known malignancy, recommend MRI of the cervical spine with contrast. Electronically Signed   By: Feliberto Harts M.D.   On: 08/21/2022 12:13   DG Pelvis 1-2 Views  Result Date: 08/21/2022 CLINICAL DATA:  Fall. EXAM: PELVIS - 1-2 VIEW COMPARISON:  April 27, 2022. FINDINGS: There is no evidence of pelvic fracture or diastasis. No pelvic bone lesions are seen. IMPRESSION: Negative. Electronically Signed   By: Lupita Raider M.D.   On: 08/21/2022 11:58   DG Chest 1 View  Result Date: 08/21/2022 CLINICAL DATA:  Fall. EXAM: CHEST  1 VIEW COMPARISON:  May 27, 2022. FINDINGS: Stable cardiomediastinal silhouette. Status post coronary bypass graft. Lungs are clear. Bony thorax is unremarkable. IMPRESSION: No active disease. Electronically Signed   By: Lupita Raider M.D.   On: 08/21/2022 11:57   DG Lumbar Spine Complete  Result Date: 08/21/2022 CLINICAL DATA:  Fall. EXAM: LUMBAR SPINE - COMPLETE 4+ VIEW COMPARISON:  July 28, 2022. FINDINGS: No fracture or spondylolisthesis is noted. Status post surgical posterior fusion of L3, L4 and L5. Interbody fusion is noted at L3-4. Moderate degenerative disc disease is noted at L4-5 and L5-S1. IMPRESSION: Postsurgical and degenerative changes as described above. No acute abnormality seen. Electronically Signed   By: Lupita Raider M.D.   On: 08/21/2022 11:56   DG Tibia/Fibula Left  Result Date: 08/21/2022 CLINICAL DATA:  Fall. EXAM: LEFT TIBIA AND FIBULA - 2 VIEW COMPARISON:  None Available. FINDINGS: There is no evidence of fracture or other focal bone lesions. Soft tissues are unremarkable. IMPRESSION: Negative. Electronically Signed   By: Lupita Raider M.D.    On: 08/21/2022 11:54    Pending Labs [employer] (From admission, onward)     Start     Ordered  08/22/22 0500  CBC  Tomorrow morning,   R        08/21/22 1558   08/22/22 0500  Comprehensive metabolic panel  Tomorrow morning,   R        08/21/22 1558   08/21/22 1338  Urine Culture  Once,   URGENT       Question:  Indication  Answer:  Dysuria   08/21/22 1337            Vitals/Pain Today's Vitals   08/21/22 1245 08/21/22 1330 08/21/22 1350 08/21/22 1445  BP: (!) 124/105 122/62  (!) 130/55  Pulse: 80 77  78  Resp: (!) 21 17  17   Temp:   (!) 97.3 F (36.3 C)   SpO2: 100% 97%  97%  Weight:      Height:      PainSc:        Isolation Precautions No active isolations  Medications Medications  cefTRIAXone (ROCEPHIN) 1 g in sodium chloride 0.9 % 100 mL IVPB (1 g Intravenous New Bag/Given 08/21/22 1602)  sodium chloride flush (NS) 0.9 % injection 3 mL (has no administration in time range)  acetaminophen (TYLENOL) tablet 650 mg (has no administration in time range)    Or  acetaminophen (TYLENOL) suppository 650 mg (has no administration in time range)  albuterol (PROVENTIL) (2.5 MG/3ML) 0.083% nebulizer solution 2.5 mg (has no administration in time range)  trimethobenzamide (TIGAN) injection 200 mg (has no administration in time range)  potassium chloride SA (KLOR-CON M) CR tablet 40 mEq (has no administration in time range)  insulin aspart (novoLOG) injection 0-15 Units (has no administration in time range)  insulin aspart (novoLOG) injection 0-5 Units (has no administration in time range)  acetaminophen (TYLENOL) tablet 650 mg (650 mg Oral Given 08/21/22 1602)  iohexol (OMNIPAQUE) 350 MG/ML injection 75 mL (75 mLs Intravenous Contrast Given 08/21/22 1425)    Mobility walks with device     Focused Assessments Neuro Assessment Handoff:  Swallow screen pass? Yes          Neuro Assessment: Within Defined Limits Neuro Checks:      Has TPA been given? No If  patient is a Neuro Trauma and patient is going to OR before floor call report to 4N Charge nurse: [phone] or [phone]   R Recommendations: See Admitting Provider Note  Report given to:   Additional Notes:

## 2022-08-22 ENCOUNTER — Inpatient Hospital Stay (HOSPITAL_COMMUNITY): Payer: Medicare Other

## 2022-08-22 DIAGNOSIS — R9431 Abnormal electrocardiogram [ECG] [EKG]: Secondary | ICD-10-CM | POA: Diagnosis not present

## 2022-08-22 DIAGNOSIS — D61818 Other pancytopenia: Secondary | ICD-10-CM | POA: Diagnosis not present

## 2022-08-22 DIAGNOSIS — S065X0A Traumatic subdural hemorrhage without loss of consciousness, initial encounter: Secondary | ICD-10-CM | POA: Diagnosis not present

## 2022-08-22 DIAGNOSIS — R634 Abnormal weight loss: Secondary | ICD-10-CM

## 2022-08-22 DIAGNOSIS — W19XXXA Unspecified fall, initial encounter: Secondary | ICD-10-CM | POA: Diagnosis not present

## 2022-08-22 LAB — CBC
HCT: 32 % — ABNORMAL LOW (ref 36.0–46.0)
Hemoglobin: 10.3 g/dL — ABNORMAL LOW (ref 12.0–15.0)
MCH: 30.9 pg (ref 26.0–34.0)
MCHC: 32.2 g/dL (ref 30.0–36.0)
MCV: 96.1 fL (ref 80.0–100.0)
Platelets: 118 10*3/uL — ABNORMAL LOW (ref 150–400)
RBC: 3.33 MIL/uL — ABNORMAL LOW (ref 3.87–5.11)
RDW: 16.4 % — ABNORMAL HIGH (ref 11.5–15.5)
WBC: 3.5 10*3/uL — ABNORMAL LOW (ref 4.0–10.5)
nRBC: 0 % (ref 0.0–0.2)

## 2022-08-22 LAB — COMPREHENSIVE METABOLIC PANEL
ALT: 39 U/L (ref 0–44)
AST: 55 U/L — ABNORMAL HIGH (ref 15–41)
Albumin: 2.4 g/dL — ABNORMAL LOW (ref 3.5–5.0)
Alkaline Phosphatase: 76 U/L (ref 38–126)
Anion gap: 5 (ref 5–15)
BUN: 16 mg/dL (ref 8–23)
CO2: 25 mmol/L (ref 22–32)
Calcium: 8.7 mg/dL — ABNORMAL LOW (ref 8.9–10.3)
Chloride: 108 mmol/L (ref 98–111)
Creatinine, Ser: 0.76 mg/dL (ref 0.44–1.00)
GFR, Estimated: >60 mL/min (ref >60)
Glucose, Bld: 243 mg/dL — ABNORMAL HIGH (ref 70–99)
Potassium: 4.1 mmol/L (ref 3.5–5.1)
Sodium: 138 mmol/L (ref 135–145)
Total Bilirubin: 1.3 mg/dL — ABNORMAL HIGH (ref 0.3–1.2)
Total Protein: 6.1 g/dL — ABNORMAL LOW (ref 6.5–8.1)

## 2022-08-22 LAB — GLUCOSE, CAPILLARY
Glucose-Capillary: 201 mg/dL — ABNORMAL HIGH (ref 70–99)
Glucose-Capillary: 461 mg/dL — ABNORMAL HIGH (ref 70–99)
Glucose-Capillary: 268 mg/dL — ABNORMAL HIGH (ref 70–99)
Glucose-Capillary: 163 mg/dL — ABNORMAL HIGH (ref 70–99)

## 2022-08-22 LAB — URINE CULTURE

## 2022-08-22 LAB — SEDIMENTATION RATE: Sed Rate: 43 mm/hr — ABNORMAL HIGH (ref 0–22)

## 2022-08-22 MED ORDER — INSULIN ASPART 100 UNIT/ML IJ SOLN
4.0000 [IU] | Freq: Three times a day (TID) | INTRAMUSCULAR | Status: DC
  Administered 2022-08-22 – 2022-08-23 (×3): 4 [IU] via SUBCUTANEOUS

## 2022-08-22 MED ORDER — INSULIN GLARGINE-YFGN 100 UNIT/ML ~~LOC~~ SOLN
20.0000 [IU] | Freq: Every day | SUBCUTANEOUS | Status: DC
  Administered 2022-08-22 – 2022-08-23 (×2): 20 [IU] via SUBCUTANEOUS
  Filled 2022-08-22 (×2): qty 0.2

## 2022-08-22 NOTE — TOC Initial Note (Signed)
Transition of Care [facility]) - Initial/Assessment Note    Patient Details  Name: [name] MRN: [id number] Date of Birth: 09-21-1945  Transition of Care [facility]) CM/SW Contact:    Kermit Balo, RN Phone Number: 08/22/2022, 1:47 PM  Clinical Narrative:                 Patient is from home with her spouse and daughter. Daughter stays during the daytime.  Daughter or spouse provide needed transportation.  Daughter oversees pts medications at home and they deny any issues.  Pharmacy: [facility] on [address] [facility] Pt is active with [facility] for RN/PT/ST. CM has asked MD to enter [facility] orders.  Pt has transportation home when medically ready.  Expected Discharge Plan: Home w Home Health Services Barriers to Discharge: Continued Medical Work up   Patient Goals and CMS Choice   CMS Medicare.gov Compare Post Acute Care list provided to:: Patient Choice offered to / list presented to : Patient, Adult Children      Expected Discharge Plan and Services   Discharge Planning Services: CM Consult Post Acute Care Choice: Home Health, Durable Medical Equipment Living arrangements for the past 2 months: Single Family Home                 DME Arranged: Bedside commode DME Agency: [employer] Date DME Agency Contacted: 08/22/22   Representative spoke with at DME Agency: Barbara Cower HH Arranged: PT, RN, Speech Therapy HH Agency: [facility] Date [facility] Agency Contacted: 08/22/22   Representative spoke with at [facility] Agency: Marylene Land  Prior Living Arrangements/Services Living arrangements for the past 2 months: Single Family Home Lives with:: Spouse Patient language and need for interpreter reviewed:: Yes Do you feel safe going back to the place where you live?: Yes      Need for Family Participation in Patient Care: Yes (Comment) Care giver support system in place?: Yes (comment) Current home services: DME (rollator/ cane) Criminal Activity/Legal Involvement Pertinent to  Current Situation/Hospitalization: No - Comment as needed  Activities of Daily Living Home Assistive Devices/Equipment: Cane (specify quad or straight), Hearing aid ADL Screening (condition at time of admission) Patient's cognitive ability adequate to safely complete daily activities?: Yes Is the patient deaf or have difficulty hearing?: Yes Does the patient have difficulty seeing, even when wearing glasses/contacts?: No Does the patient have difficulty concentrating, remembering, or making decisions?: Yes Patient able to express need for assistance with ADLs?: Yes Does the patient have difficulty dressing or bathing?: No Independently performs ADLs?: No Communication: Independent Dressing (OT): Independent Grooming: Independent Feeding: Independent Bathing: Independent Toileting: Independent In/Out Bed: Independent with device (comment) Walks in Home: Independent with device (comment) Does the patient have difficulty walking or climbing stairs?: Yes Weakness of Legs: None Weakness of Arms/Hands: None  Permission Sought/Granted                  Emotional Assessment Appearance:: Appears stated age Attitude/Demeanor/Rapport: Engaged Affect (typically observed): Accepting Orientation: : Oriented to Self, Oriented to Place, Oriented to  Time, Oriented to Situation   Psych Involvement: No (comment)  Admission diagnosis:  Subdural hemorrhage [I62.00] SAH (subarachnoid hemorrhage) [I60.9] Traumatic subdural hematoma [S06.5XAA] Fall, initial encounter [id number]] Patient Active Problem List   Diagnosis Date Noted   Traumatic subdural hematoma 08/21/2022   Prolonged QT interval 08/21/2022   Subarachnoid hemorrhage 08/21/2022   Lumbar transverse process fracture 08/21/2022   Fall at home 08/21/2022   Wound of left leg 08/21/2022   Pancytopenia  08/21/2022   Mucocele of appendix 08/21/2022   Weight loss 08/21/2022   Vertigo 07/01/2022   Chronic diastolic CHF (congestive  heart failure) 07/01/2022   GAD (generalized anxiety disorder) 07/01/2022   Bilateral lower extremity edema 07/01/2022   Tenderness of left calf 07/01/2022   Left hip pain 07/01/2022   Hypoalbuminemia 07/01/2022   S/P lumbar fusion 02/25/2022   Pyohydronephrosis 05/29/2021   URI (upper respiratory infection) 05/12/2021   Lymphopenia 11/05/2020   CHF (congestive heart failure)    Coronary artery disease    Type II diabetes mellitus    Type 2 diabetes mellitus without complication    COVID-19 virus infection 04/2020   A-fib 08/19/2019   S/P TIPS (transjugular intrahepatic portosystemic shunt)    Hypercalcemia 05/26/2019   AKI (acute kidney injury)    Hyponatremia    Acute lower UTI 05/16/2019   Acute delirium    General weakness 05/15/2019   Encephalopathy, hepatic 05/15/2019   Volume depletion 05/15/2019   Bleeding esophageal varices    UGI bleed    Infarction of spleen 04/06/2019   Odynophagia 04/01/2019   Dysphagia    Hepatoma 01/31/2019   Coronary artery disease of native artery of native heart with stable angina pectoris 01/31/2019   Thrombocytopenia 09/25/2017   Left forearm pain 05/29/2017   Portal vein thrombosis 04/22/2017   Skin cancer of nose 08/23/2016   History of colonic polyps    Benign neoplasm of rectum    Esophageal varices without bleeding    Portal hypertensive gastropathy    Leg cramps 05/18/2016   Routine lab draw 05/13/2016   Iron deficiency anemia 05/09/2016   Statin-induced rhabdomyolysis 05/07/2016   Liver cirrhosis secondary to NASH 04/05/2016   PVC's (premature ventricular contractions) 04/05/2016   CAD (coronary artery disease) 02/16/2016   Hypokalemia 02/16/2016   S/P CABG x 3 02/02/2016   History of non-ST elevation myocardial infarction (NSTEMI) 01/27/2016   DM2 (diabetes mellitus, type 2) 12/15/2015   Trochanteric bursitis of left hip 12/15/2015   S/P lumbar spinal fusion 06/10/2015   Paresthesia 04/01/2015   Osteoporosis 03/31/2015    Hearing loss 06/25/2014   Insomnia 06/25/2014   Hair thinning 06/25/2014   Muscle spasm 10/08/2013   Biceps tendon rupture, proximal 11/24/2012   Hyperlipidemia    GERD (gastroesophageal reflux disease)    Essential hypertension    PCP:  Frederica Kuster, MD Pharmacy:   [id number] [place], [place] - [address] AT [facility] & [address] [place] [address] Phone: [phone] Fax: [phone]     Social Determinants of Health (SDOH) Social History: SDOH Screenings   Food Insecurity: No Food Insecurity (08/21/2022)  Housing: Low Risk  (08/21/2022)  Transportation Needs: No Transportation Needs (08/21/2022)  Utilities: Not At Risk (08/21/2022)  Alcohol Screen: Low Risk  (06/04/2018)  Depression (PHQ2-9): Low Risk  (08/15/2022)  Financial Resource Strain: Low Risk  (05/29/2017)  Physical Activity: Inactive (05/29/2017)  Social Connections: Moderately Integrated (05/29/2017)  Stress: No Stress Concern Present (05/29/2017)  Tobacco Use: Low Risk  (08/21/2022)   SDOH Interventions:     Readmission Risk Interventions    07/03/2022   12:13 PM  Readmission Risk Prevention Plan  Transportation Screening Complete  Medication Review (RN Care Manager) Not Complete  PCP or Specialist appointment within 3-5 days of discharge Complete  SW Recovery Care/Counseling Consult Complete  Palliative Care Screening Not Complete  Comments patient will go to skilled nursing  Skilled Nursing Facility Complete

## 2022-08-22 NOTE — Plan of Care (Signed)

## 2022-08-22 NOTE — Progress Notes (Signed)
   08/22/22 0344  Vitals  Temp 98.6 F (37 C)  Temp Source Oral  BP 114/63  MAP (mmHg) 77  BP Location Right Arm  BP Method Automatic  Patient Position (if appropriate) Lying  Pulse Rate 73  Pulse Rate Source Monitor  ECG Heart Rate 73  Resp 18  Level of Consciousness  Level of Consciousness Alert  Pain Assessment  Pain Scale 0-10  Pain Score 0  MEWS Score  MEWS Temp 0  MEWS Systolic 0  MEWS Pulse 0  MEWS RR 0  MEWS LOC 0  MEWS Score 0  MEWS Score Color Green      Telemetry called reporting a short run of SVT. On assessment patient is asleep. BP stable, denies any pain. Primary RN informed.

## 2022-08-22 NOTE — Evaluation (Signed)
Physical Therapy Evaluation Patient Details Name: [name] MRN: [id number] DOB: 08/29/1945 Today's Date: 08/22/2022  History of Present Illness  77 y.o. female presents to [facility] hospital on 08/21/2022 after falling at home. Pt found to have acute SDH along the left anterior falx and left tentorial leaflet as well as small SAH. Lumbar 1-3 left TP fxs. Lytic lesion noted at C6 body. PMH includes NASH cirrhosis, diastolic CHF, DMII, HLD, anxiety, lumbar fusion, CVA.  Clinical Impression  Pt presents to PT with chronic deficits in gait and balance, but is able to ambulate with support of rollator well at this time. Pt has a history of instability and falls, PT recommends use of SPC or rollator for all out of bed activity. Pt's fall resulting in this admission occurred when attempting to get to the bathroom without DME. PT recommends the pt receive a bedside commode to reduce the risk for falls when attempting to utilize the bathroom. PT recommends continued HHPT at the time of discharge.       Recommendations for follow up therapy are one component of a multi-disciplinary discharge planning process, led by the attending physician.  Recommendations may be updated based on patient status, additional functional criteria and insurance authorization.  Follow Up Recommendations       Assistance Recommended at Discharge Frequent or constant Supervision/Assistance  Patient can return home with the following  A little help with bathing/dressing/bathroom;Assistance with cooking/housework;Assist for transportation;Help with stairs or ramp for entrance    Equipment Recommendations BSC/3in1  Recommendations for Other Services       Functional Status Assessment Patient has had a recent decline in their functional status and demonstrates the ability to make significant improvements in function in a reasonable and predictable amount of time.     Precautions / Restrictions Precautions Precautions:  Fall Precaution Comments: history of multiple falls Restrictions Weight Bearing Restrictions: No      Mobility  Bed Mobility                    Transfers Overall transfer level: Needs assistance Equipment used: Rollator (4 wheels) Transfers: Sit to/from Stand Sit to Stand: Supervision                Ambulation/Gait Ambulation/Gait assistance: Supervision Gait Distance (Feet): 300 Feet Assistive device: Rollator (4 wheels) Gait Pattern/deviations: Step-through pattern Gait velocity: functional Gait velocity interpretation: 1.31 - 2.62 ft/sec, indicative of limited community ambulator   General Gait Details: steady step-through gait  Stairs Stairs: Yes Stairs assistance: Supervision Stair Management: Two rails, Step to pattern, Forwards Number of Stairs: 2    Wheelchair Mobility    Modified Rankin (Stroke Patients Only)       Balance Overall balance assessment: Needs assistance Sitting-balance support: No upper extremity supported, Feet supported Sitting balance-Leahy Scale: Good     Standing balance support: Single extremity supported, Bilateral upper extremity supported, Reliant on assistive device for balance Standing balance-Leahy Scale: Poor                               Pertinent Vitals/Pain Pain Assessment Pain Assessment: No/denies pain    Home Living Family/patient expects to be discharged to:: Private residence Living Arrangements: Spouse/significant other Available Help at Discharge: Family;Available 24 hours/day Type of Home: House Home Access: Stairs to enter Entrance Stairs-Rails: Doctor, general practice of Steps: 4   Home Layout: One level Home Equipment: Grab bars - tub/shower;Rolling Environmental consultant (2  wheels);Toilet riser;Cane - single point;Rollator (4 wheels);Shower seat - built in      Prior Function Prior Level of Function : Independent/Modified Independent;History of Falls (last six months)              Mobility Comments: ambulates with SPC most often, rollator on days when she feels less steady       Hand Dominance   Dominant Hand: Right    Extremity/Trunk Assessment   Upper Extremity Assessment Upper Extremity Assessment: Overall WFL for tasks assessed    Lower Extremity Assessment Lower Extremity Assessment: LLE deficits/detail LLE Deficits / Details: wound to left posterior calf, dressing saturated, RN made aware    Cervical / Trunk Assessment Cervical / Trunk Assessment: Normal  Communication   Communication: HOH (hearing aides)  Cognition Arousal/Alertness: Awake/alert Behavior During Therapy: WFL for tasks assessed/performed Overall Cognitive Status: Within Functional Limits for tasks assessed                                          General Comments General comments (skin integrity, edema, etc.): VSS on RA    Exercises     Assessment/Plan    PT Assessment Patient needs continued PT services  PT Problem List Decreased balance;Decreased mobility;Decreased knowledge of use of DME;Decreased safety awareness;Decreased knowledge of precautions       PT Treatment Interventions DME instruction;Gait training;Stair training;Functional mobility training;Therapeutic activities;Therapeutic exercise;Balance training;Neuromuscular re-education;Patient/family education    PT Goals (Current goals can be found in the Care Plan section)  Acute Rehab PT Goals Patient Stated Goal: to return home PT Goal Formulation: With patient Time For Goal Achievement: 09/05/22 Potential to Achieve Goals: Good Additional Goals Additional Goal #1: Pt will score >19/24 on the DGI to indicate a reduced risk for falls Additional Goal #2: Pt will score >45/56 on the BERD to indicate a reduced risk for falls    Frequency Min 2X/week     Co-evaluation               AM-PAC PT "6 Clicks" Mobility  Outcome Measure Help needed turning from your back to your  side while in a flat bed without using bedrails?: None Help needed moving from lying on your back to sitting on the side of a flat bed without using bedrails?: None Help needed moving to and from a bed to a chair (including a wheelchair)?: A Little Help needed standing up from a chair using your arms (e.g., wheelchair or bedside chair)?: A Little Help needed to walk in hospital room?: A Little Help needed climbing 3-5 steps with a railing? : A Little 6 Click Score: 20    End of Session   Activity Tolerance: Patient tolerated treatment well Patient left: in chair;with call bell/phone within reach;with family/visitor present Nurse Communication: Mobility status PT Visit Diagnosis: Other abnormalities of gait and mobility (R26.89);History of falling (Z91.81)    Time: 7322-0254 PT Time Calculation (min) (ACUTE ONLY): 26 min   Charges:   PT Evaluation $PT Eval Low Complexity: 1 Low          Arlyss Gandy, PT, DPT Acute Rehabilitation Office [phone]   Arlyss Gandy 08/22/2022, 10:30 AM

## 2022-08-22 NOTE — Telephone Encounter (Signed)
This is a patient of Dr Christella Hartigan.  Please advise if OK to refill lactulose.   Thank you

## 2022-08-22 NOTE — Progress Notes (Signed)
Patient ID: [name], female   DOB: Feb 24, 1946, 77 y.o.   MRN: [id number] She is awake and alert and pleasant and conversant.  She denies headache.  She is moving all extremities.  Awaiting follow-up head CT

## 2022-08-22 NOTE — Evaluation (Signed)
Occupational Therapy Evaluation Patient Details Name: [name] MRN: [id number] DOB: Feb 07, 1946 Today's Date: 08/22/2022   History of Present Illness 78 y.o. female presents to [facility] hospital on 08/21/2022 after falling at home. Pt found to have acute SDH along the left anterior falx and left tentorial leaflet as well as small SAH. Lumbar 1-3 left TP fxs. Lytic lesion noted at C6 body. PMH includes NASH cirrhosis, diastolic CHF, DMII, HLD, anxiety, lumbar fusion, CVA.   Clinical Impression   Patient admitted for the diagnosis above.  PTA she lives at home with her spouse, she typically uses a [facility] for mobility, competes her own ADL/iADL, and just completed HH OT.  Despite a new fall, given the assist she will have from family, no post acute OT is anticipated.  No acute OT needs exist, recommend up with staff.        Recommendations for follow up therapy are one component of a multi-disciplinary discharge planning process, led by the attending physician.  Recommendations may be updated based on patient status, additional functional criteria and insurance authorization.   Assistance Recommended at Discharge Intermittent Supervision/Assistance  Patient can return home with the following Assist for transportation;Assistance with cooking/housework;A little help with walking and/or transfers;A little help with bathing/dressing/bathroom    Functional Status Assessment  Patient has had a recent decline in their functional status and demonstrates the ability to make significant improvements in function in a reasonable and predictable amount of time.  Equipment Recommendations  None recommended by OT    Recommendations for Other Services       Precautions / Restrictions Precautions Precautions: Fall Precaution Comments: history of multiple falls Restrictions Weight Bearing Restrictions: No      Mobility Bed Mobility Overal bed mobility: Modified Independent                   Transfers Overall transfer level: Needs assistance   Transfers: Sit to/from Stand Sit to Stand: Supervision                  Balance Overall balance assessment: Needs assistance Sitting-balance support: No upper extremity supported, Feet supported Sitting balance-Leahy Scale: Good     Standing balance support: Single extremity supported Standing balance-Leahy Scale: Fair                             ADL either performed or assessed with clinical judgement   ADL                                         General ADL Comments: Generalized supervision from a sit to stand level     Vision Patient Visual Report: No change from baseline       Perception     Praxis      Pertinent Vitals/Pain Pain Assessment Pain Assessment: Faces Faces Pain Scale: Hurts little more Pain Location: L hip and back of her head Pain Descriptors / Indicators: Tender Pain Intervention(s): Monitored during session     Hand Dominance Right   Extremity/Trunk Assessment Upper Extremity Assessment Upper Extremity Assessment: Overall WFL for tasks assessed   Lower Extremity Assessment Lower Extremity Assessment: Defer to PT evaluation LLE Deficits / Details: wound to left posterior calf, dressing saturated, RN made aware   Cervical / Trunk Assessment Cervical / Trunk Assessment: Normal   Communication Communication Communication:  HOH   Cognition Arousal/Alertness: Awake/alert Behavior During Therapy: WFL for tasks assessed/performed Overall Cognitive Status: Within Functional Limits for tasks assessed                                       General Comments  VSS on RA    Exercises     Shoulder Instructions      Home Living Family/patient expects to be discharged to:: Private residence Living Arrangements: Spouse/significant other Available Help at Discharge: Family;Available 24 hours/day Type of Home: House Home Access: Stairs to  enter [employer] of Steps: 4 Entrance Stairs-Rails: Right;Left Home Layout: One level     Bathroom Shower/Tub: Producer, television/film/video: Handicapped height Bathroom Accessibility: Yes How Accessible: Accessible via walker Home Equipment: Grab bars - tub/shower;Rolling Walker (2 wheels);Toilet riser;Cane - single point;Rollator (4 wheels);Shower seat - built in          Prior Functioning/Environment Prior Level of Function : Independent/Modified Independent;History of Falls (last six months)             Mobility Comments: ambulates with SPC most often, rollator on days when she feels less steady ADLs Comments: Does ADLs and IADLs        OT Problem List: Pain      OT Treatment/Interventions:      OT Goals(Current goals can be found in the care plan section) Acute Rehab OT Goals Patient Stated Goal: Return home OT Goal Formulation: With patient Time For Goal Achievement: 08/26/22 Potential to Achieve Goals: Good  OT Frequency:      Co-evaluation              AM-PAC OT "6 Clicks" Daily Activity     Outcome Measure Help from another person eating meals?: None Help from another person taking care of personal grooming?: None Help from another person toileting, which includes using toliet, bedpan, or urinal?: A Little Help from another person bathing (including washing, rinsing, drying)?: A Little Help from another person to put on and taking off regular upper body clothing?: None Help from another person to put on and taking off regular lower body clothing?: A Little 6 Click Score: 21   End of Session Nurse Communication: Mobility status  Activity Tolerance: Patient tolerated treatment well Patient left: in bed;with call bell/phone within reach;with family/visitor present  OT Visit Diagnosis: Unsteadiness on feet (R26.81)                Time: 1191-4782 OT Time Calculation (min): 17 min Charges:  OT General Charges $OT Visit: 1  Visit OT Evaluation $OT Eval Moderate Complexity: 1 Mod  08/22/2022  RP, OTR/L  Acute Rehabilitation Services  Office:  [phone]   Suzanna Obey 08/22/2022, 1:48 PM

## 2022-08-22 NOTE — Progress Notes (Signed)
    Durable Medical Equipment  (From admission, onward)           Start     Ordered   08/22/22 1342  For home use only DME Bedside commode  Once       Question:  Patient needs a bedside commode to treat with the following condition  Answer:  SDH (subdural hematoma)   08/22/22 1341            The patient requires a bedside commode as the patient has difficulty making it to the bathroom in a timely manner on the level of the home she will be staying.

## 2022-08-22 NOTE — Progress Notes (Signed)
PROGRESS NOTE  [name] ZOX:[id number] DOB: Apr 07, 1946   PCP: Frederica Kuster, MD  Patient is from: Home.  Lives with husband.  Uses rollator at baseline.  DOA: 08/21/2022 LOS: 1  Chief complaints No chief complaint on file.    Brief Narrative / Interim history: 77 year old F with PMH of NASH cirrhosis s/p TI PS, diastolic CHF, DM-2, CVA, anemia, UTIs, lumbar fusion, chronic edema, HLD and anxiety presenting with accidental fall at home after she lost her balance and fell backward hitting her head on sewing machine.  She denies LOC.  Denies new medication.  CT head showed left 7 mm left SDH with small volume adjacent SAH and lytic lesion at C6 vertebral body posterior element.  CT abdomen and pelvis showed acute nondisplaced L1, L2 and L3 transverse process fractures, cirrhosis with sequela of portal HTN and patent TIPS, dilated fluid-filled mild to distal appendix measuring up to 2.3 cm (previously 1.9 cm) without surrounding inflammatory change.  Urinalysis concerning for UTI.  Neurosurgery consulted.  Urine culture obtained.  Patient was started on IV ceftriaxone and admitted.   Next day, headache resolved.  Urine culture with 1000 colonies of E. coli.  CT head pending.    Subjective: Seen and examined earlier this morning and this afternoon.  No major events overnight of this morning.  Patient has no complaints. She denies headache, vision change, focal numbness, tingling, weakness, chest pain, dyspnea, cough, GI or UTI symptoms.  She is eager to go home but understand the need to stay pending culture speciation and sensitivity.  Patient's husband, daughter, granddaughter and great grandson at bedside.  Objective: Vitals:   08/21/22 2322 08/22/22 0344 08/22/22 0938 08/22/22 1217  BP: (!) 117/53 114/63 (!) 124/53 (!) 124/55  Pulse: 82 73 69 73  Resp: 17 18 15 15   Temp: 98.7 F (37.1 C) 98.6 F (37 C) 98.5 F (36.9 C) 97.7 F (36.5 C)  TempSrc: Oral Oral Oral Oral   SpO2: 95%  100% 99%  Weight:      Height:        Examination:  GENERAL: No apparent distress.  Nontoxic. HEENT: MMM.  Vision and hearing grossly intact.  NECK: Supple.  No apparent JVD.  RESP:  No IWOB.  Fair aeration bilaterally. CVS:  RRR. Heart sounds normal.  ABD/GI/GU: BS+. Abd soft, NTND.  MSK/EXT:  Moves extremities. No apparent deformity. No edema.  SKIN: no apparent skin lesion or wound NEURO: Awake, alert and oriented appropriately.  No apparent focal neuro deficit. PSYCH: Calm. Normal affect.   Procedures:  None  Microbiology summarized: Urine culture with 100,000 GNR.  Assessment and plan: Principal Problem:   Traumatic subdural hematoma Active Problems:   Subarachnoid hemorrhage   Fall at home   Lumbar transverse process fracture   Acute lower UTI   Wound of left leg   Hypokalemia   Pancytopenia   Liver cirrhosis secondary to NASH   DM2 (diabetes mellitus, type 2)   Chronic diastolic CHF (congestive heart failure)   Prolonged QT interval   Mucocele of appendix   Weight loss   Accidental fall at home: Looks like mechanical fall from not using her rollator consistently. Acute traumatic subdural hematoma and subarachnoid hemorrhage due to fall at home: CT head showed acute left SDH with adjacent small volume SAH.  Headache and neck pain resolved.  No focal neurodeficit. -Follow-up repeat CT -Appreciate input by neurosurgery -Fall precaution -PT/OT -Advised on consistent use of rollator  Nondisplaced lumbar transverse process  fracture 2/2 to fall at home-L1-L3 -Management as above.  NASH cirrhosis s/p TIPS -Continue rifaximin, furosemide, and lactulose   Possible UTI: Urine culture with GNR.  Seems like she has history of recurrent UTI. -Follow culture speciation -Continue IV ceftriaxone -Discontinue Jardiance on discharge   Wound of left leg: Open wound of the posterior aspect of the left leg with no significant erythema appreciated at this  time draining serosanguineous fluid.  Patient notes this is the same leg  that she had cellulitis previously. -Continue local wound care. -Already on antibiotic as above   Chronic diastolic CHF: Last TTE with LVEF of 60 to 65%, G2 DD and normal RVSP.  Appears euvolemic on exam. -Continue home diuretics -Strict intake and output, daily weights, renal functions and electrolytes  Pancytopenia: Could be due to cirrhosis and UTI. -Recheck in the morning    Uncontrolled IDDM-2 with hyperglycemia: A1c 8.8%. 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  Benign exam. -May need outpatient follow-up with surgery   Lytic lesion of left C6 vertebral body posterior elements: Incidental. Per radiology, may be a benign lesion such as a vertebral venous malformation; however, a metastasis could have a similar appearance. If the patient has a known malignancy, recommend MRI of the cervical spine with contrast. However, patient without known  malignancy -Outpatient follow-up.   Hypokalemia: Resolved.   Prolonged QT interval: QTc 544. -Correct electrolyte abnormality -Avoid QT prolonging medications   Weight loss: Reported 30 pounds weight loss in the last 2 months Body mass index is 21.09 kg/m. Wt Readings from Last 10 Encounters:  08/21/22 54 kg  08/03/22 54.3 kg  07/19/22 55.8 kg  07/03/22 64 kg  05/18/22 58.1 kg  03/24/22 56.2 kg  03/07/22 57 kg  02/25/22 57.2 kg  02/18/22 58.1 kg  02/17/22 57.4 kg  -Consult dietitian            DVT prophylaxis:  SCDs Start: 08/21/22 1555  Code Status: Full code Family  Communication: Updated patient's husband, daughter and granddaughter at bedside Level of care: Progressive Status is: Inpatient Remains inpatient appropriate because: Subdural and subarachnoid hemorrhoid, UTI and lumbar fractures   Final disposition: Home once medically cleared Consultants:  Neurosurgery  55 minutes with more than 50% spent in reviewing records, counseling patient/family and coordinating care.   Sch Meds:  Scheduled Meds:  furosemide  40 mg Oral Daily   insulin aspart  0-15 Units Subcutaneous TID WC   insulin aspart  0-5 Units Subcutaneous QHS   insulin aspart  4 Units Subcutaneous TID WC   insulin glargine-yfgn  20 Units Subcutaneous Daily   lactulose  20 g Oral Daily   polyvinyl alcohol  1-2 drop Both Eyes QHS   sodium chloride flush  3 mL Intravenous Q12H   Continuous Infusions:  cefTRIAXone (ROCEPHIN)  IV     PRN Meds:.acetaminophen **OR** acetaminophen, albuterol, meclizine, oxyCODONE, trimethobenzamide  Antimicrobials: Anti-infectives (From admission, onward)    Start     Dose/Rate Route Frequency Ordered Stop   08/22/22 1600  cefTRIAXone (ROCEPHIN) 1 g in sodium chloride 0.9 % 100 mL IVPB  1 g 200 mL/hr over 30 Minutes Intravenous Every 24 hours 08/21/22 2035     08/21/22 1345  cefTRIAXone (ROCEPHIN) 1 g in sodium chloride 0.9 % 100 mL IVPB        1 g 200 mL/hr over 30 Minutes Intravenous  Once 08/21/22 1337 08/21/22 1632        I have personally reviewed the following labs and images: CBC: Recent Labs  Lab 08/21/22 1211 08/22/22 0333  WBC 4.3 3.5*  NEUTROABS 3.0  --   HGB 11.2* 10.3*  HCT 35.5* 32.0*  MCV 97.5 96.1  PLT 134* 118*   BMP &GFR Recent Labs  Lab 08/21/22 1211 08/22/22 0333  NA 138 138  K 3.4* 4.1  CL 103 108  CO2 23 25  GLUCOSE 188* 243*  BUN 18 16  CREATININE 0.69 0.76  CALCIUM 9.6 8.7*   Estimated Creatinine Clearance: 50.3 mL/min (by C-G formula based on SCr of 0.76 mg/dL). Liver & Pancreas: Recent  Labs  Lab 08/21/22 1211 08/22/22 0333  AST 49* 55*  ALT 42 39  ALKPHOS 87 76  BILITOT 1.3* 1.3*  PROT 7.1 6.1*  ALBUMIN 2.9* 2.4*   No results for input(s): "LIPASE", "AMYLASE" in the last 168 hours. Recent Labs  Lab 08/21/22 1757  AMMONIA 22   Diabetic: No results for input(s): "HGBA1C" in the last 72 hours. Recent Labs  Lab 08/21/22 1741 08/21/22 2252 08/22/22 0614 08/22/22 1219  GLUCAP 128* 383* 201* 461*   Cardiac Enzymes: No results for input(s): "CKTOTAL", "CKMB", "CKMBINDEX", "TROPONINI" in the last 168 hours. No results for input(s): "PROBNP" in the last 8760 hours. Coagulation Profile: Recent Labs  Lab 08/21/22 2111  INR 1.2   Thyroid Function Tests: No results for input(s): "TSH", "T4TOTAL", "FREET4", "T3FREE", "THYROIDAB" in the last 72 hours. Lipid Profile: No results for input(s): "CHOL", "HDL", "LDLCALC", "TRIG", "CHOLHDL", "LDLDIRECT" in the last 72 hours. Anemia Panel: No results for input(s): "VITAMINB12", "FOLATE", "FERRITIN", "TIBC", "IRON", "RETICCTPCT" in the last 72 hours. Urine analysis:    Component Value Date/Time   COLORURINE YELLOW 08/21/2022 1211   APPEARANCEUR CLEAR 08/21/2022 1211   LABSPEC 1.031 (H) 08/21/2022 1211   PHURINE 5.0 08/21/2022 1211   GLUCOSEU >=500 (A) 08/21/2022 1211   HGBUR NEGATIVE 08/21/2022 1211   BILIRUBINUR NEGATIVE 08/21/2022 1211   BILIRUBINUR Negative 03/24/2022 1031   KETONESUR NEGATIVE 08/21/2022 1211   PROTEINUR NEGATIVE 08/21/2022 1211   UROBILINOGEN 0.2 03/24/2022 1031   UROBILINOGEN 0.2 12/18/2011 1409   NITRITE POSITIVE (A) 08/21/2022 1211   LEUKOCYTESUR NEGATIVE 08/21/2022 1211   Sepsis Labs: Invalid input(s): "PROCALCITONIN", "LACTICIDVEN"  Microbiology: Recent Results (from the past 240 hour(s))  Urine Culture     Status: Abnormal (Preliminary result)   Collection Time: 08/21/22  1:38 PM   Specimen: Urine, Clean Catch  Result Value Ref Range Status   Specimen Description URINE, CLEAN  CATCH  Final   Special Requests NONE  Final   Culture (A)  Final    >=100,000 COLONIES/mL GRAM NEGATIVE RODS SUSCEPTIBILITIES TO FOLLOW Performed at [facility], [address] [place], [place] [address]    Report Status PENDING  Incomplete    Radiology Studies: CT HEAD WO CONTRAST ( )  Result Date: 08/22/2022 CLINICAL DATA:  Subarachnoid hemorrhage EXAM: CT HEAD WITHOUT CONTRAST TECHNIQUE: Contiguous axial images were obtained from the base of the skull through the vertex without intravenous contrast. RADIATION DOSE REDUCTION: This exam was performed according to the departmental dose-optimization program which includes automated  exposure control, adjustment of the mA and/or kV according to patient size and/or use of iterative reconstruction technique. COMPARISON:  CT Head 08/21/22 FINDINGS: Brain: Redemonstrated subdural hematoma along the anterior falx and along the left tentorial leaflet with adjacent subarachnoid hemorrhage. No CT evidence of an acute infarct. No hydrocephalus. No evidence of intraventricular blood products. No mass effect. Vascular: No hyperdense vessel or unexpected calcification. Skull: Soft tissue hematoma along the left occipital scalp without evidence of an underlying calvarial fracture. Sinuses/Orbits: No middle ear or mastoid effusion. Paranasal sinuses are clear. Orbits are unremarkable. Other: None. IMPRESSION: 1. Unchanged subdural hematoma along the anterior falx and left tentorial leaflet with adjacent subarachnoid hemorrhage. No mass effect. 2. Soft tissue hematoma along the left occipital scalp without evidence of an underlying calvarial fracture. Electronically Signed   By: Lorenza Cambridge M.D.   On: 08/22/2022 11:16   CT CHEST ABDOMEN PELVIS W CONTRAST  Result Date: 08/21/2022 CLINICAL DATA:  Fall. EXAM: CT CHEST, ABDOMEN, AND PELVIS WITH CONTRAST TECHNIQUE: Multidetector CT imaging of the chest, abdomen and pelvis was performed following the standard  protocol during bolus administration of intravenous contrast. RADIATION DOSE REDUCTION: This exam was performed according to the departmental dose-optimization program which includes automated exposure control, adjustment of the mA and/or kV according to patient size and/or use of iterative reconstruction technique. CONTRAST:  75mL OMNIPAQUE IOHEXOL 350 MG/ML SOLN COMPARISON:  CT abdomen pelvis dated May 28, 2022. CT chest dated August 20, 2019. FINDINGS: CT CHEST FINDINGS Cardiovascular: No significant vascular findings. Mild cardiomegaly. Prior CABG. No pericardial effusion. Mediastinum/Nodes: No enlarged mediastinal, hilar, or axillary lymph nodes. Thyroid gland, trachea, and esophagus demonstrate no significant findings. Lungs/Pleura: No focal consolidation, pleural effusion, or pneumothorax. Musculoskeletal: No acute or significant osseous findings. CT ABDOMEN PELVIS FINDINGS Hepatobiliary: Cirrhotic liver again noted without focal abnormality. Patent TIPS with unchanged focal dilatation of the right hepatic vein just distal to the shunt. Mildly distended gallbladder without radiopaque gallstones or wall thickening. No biliary dilatation. Pancreas: Atrophic. No pancreatic ductal dilatation or surrounding inflammatory changes. Spleen: Unchanged splenomegaly. Adrenals/Urinary Tract: Adrenal glands are unremarkable. Kidneys are normal, without renal calculi, focal lesion, or hydronephrosis. Bladder is unremarkable. Stomach/Bowel: Stomach is within normal limits. No evidence of bowel wall thickening, distention, or inflammatory changes. Mobile cecum again noted. The mid to distal appendix is dilated and fluid-filled, measuring up to 2.3 cm in diameter (series 3, image 85), previously 1.9 cm in January 2024, and 1.3 cm in January 2023. No surrounding inflammatory change. Vascular/Lymphatic: Aortic atherosclerosis. Embolization coils in the gastrohepatic ligament again noted. No enlarged abdominal or pelvic lymph  nodes. Reproductive: Status post hysterectomy. No adnexal masses. Other: No abdominal wall hernia or abnormality. No abdominopelvic ascites. No pneumoperitoneum. Musculoskeletal: Acute nondisplaced fractures of the left L1, L2, and L3 transverse processes. IMPRESSION: CHEST: 1. No acute traumatic injury. ABDOMEN AND PELVIS: 1. Acute nondisplaced fractures of the left L1-L2, and L3 transverse processes. 2. Cirrhosis with sequelae of portal hypertension. Patent TIPS. 3. Dilated and fluid-filled mid to distal appendix, measuring up to 2.3 cm in diameter, previously 1.9 cm in January. No surrounding inflammatory change. 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 Surgical consultation is recommended. 4.  Aortic Atherosclerosis (ICD10-I70.0). Electronically Signed   By: Obie Dredge M.D.   On: 08/21/2022 15:09      Alvera Tourigny T. Ladena Jacquez [facility]  If 7PM-7AM, please contact night-coverage [email or web address] 08/22/2022, 1:50 PM

## 2022-08-22 NOTE — Progress Notes (Signed)
Patient ID: [name], female   DOB: Jul 14, 1945, 77 y.o.   MRN: [id number] CT scan of head reviewed and shows no change.  She is safe for discharge home from our standpoint with plans to follow-up with [place] in 2 weeks in the office.  We will sign off.  Please call for any further questions

## 2022-08-23 DIAGNOSIS — W19XXXA Unspecified fall, initial encounter: Secondary | ICD-10-CM | POA: Diagnosis not present

## 2022-08-23 DIAGNOSIS — D61818 Other pancytopenia: Secondary | ICD-10-CM | POA: Diagnosis not present

## 2022-08-23 DIAGNOSIS — S065X0A Traumatic subdural hemorrhage without loss of consciousness, initial encounter: Secondary | ICD-10-CM | POA: Diagnosis not present

## 2022-08-23 DIAGNOSIS — I609 Nontraumatic subarachnoid hemorrhage, unspecified: Secondary | ICD-10-CM | POA: Diagnosis not present

## 2022-08-23 LAB — RENAL FUNCTION PANEL
Albumin: 2.2 g/dL — ABNORMAL LOW (ref 3.5–5.0)
Anion gap: 5 (ref 5–15)
BUN: 15 mg/dL (ref 8–23)
CO2: 23 mmol/L (ref 22–32)
Calcium: 8.4 mg/dL — ABNORMAL LOW (ref 8.9–10.3)
Chloride: 106 mmol/L (ref 98–111)
Creatinine, Ser: 0.67 mg/dL (ref 0.44–1.00)
GFR, Estimated: >60 mL/min (ref >60)
Glucose, Bld: 203 mg/dL — ABNORMAL HIGH (ref 70–99)
Phosphorus: 2.3 mg/dL — ABNORMAL LOW (ref 2.5–4.6)
Potassium: 3.4 mmol/L — ABNORMAL LOW (ref 3.5–5.1)
Sodium: 134 mmol/L — ABNORMAL LOW (ref 135–145)

## 2022-08-23 LAB — CBC
HCT: 31 % — ABNORMAL LOW (ref 36.0–46.0)
Hemoglobin: 10.5 g/dL — ABNORMAL LOW (ref 12.0–15.0)
MCH: 31.6 pg (ref 26.0–34.0)
MCHC: 33.9 g/dL (ref 30.0–36.0)
MCV: 93.4 fL (ref 80.0–100.0)
Platelets: 103 10*3/uL — ABNORMAL LOW (ref 150–400)
RBC: 3.32 MIL/uL — ABNORMAL LOW (ref 3.87–5.11)
RDW: 16.3 % — ABNORMAL HIGH (ref 11.5–15.5)
WBC: 3.9 10*3/uL — ABNORMAL LOW (ref 4.0–10.5)
nRBC: 0 % (ref 0.0–0.2)

## 2022-08-23 LAB — GLUCOSE, CAPILLARY: Glucose-Capillary: 198 mg/dL — ABNORMAL HIGH (ref 70–99)

## 2022-08-23 LAB — URINE CULTURE: Culture: >=100000 — AB

## 2022-08-23 LAB — MAGNESIUM: Magnesium: 1.8 mg/dL (ref 1.7–2.4)

## 2022-08-23 MED ORDER — POTASSIUM CHLORIDE CRYS ER 20 MEQ PO TBCR
40.0000 meq | EXTENDED_RELEASE_TABLET | ORAL | Status: DC
  Administered 2022-08-23: 40 meq via ORAL
  Filled 2022-08-23: qty 2

## 2022-08-23 MED ORDER — FOSFOMYCIN TROMETHAMINE 3 G PO PACK
3.0000 g | PACK | Freq: Once | ORAL | Status: AC
  Administered 2022-08-23: 3 g via ORAL
  Filled 2022-08-23: qty 3

## 2022-08-23 NOTE — Consult Note (Signed)
   [facility] Inpatient Consult   08/23/2022  Laine Fonner 15-Jan-1946 [id number]  [facility] [[facility]]  Accountable Care Organization [ACO] Patient: Medicare ACO REACH  Primary Care Provider:  Frederica Kuster, MD with [facility]   Patient screened for 3 hospitalizations in 6 months with noted extreme high risk score for unplanned readmission risk to assess for potential  restart of [facility]  [[facility]] Care Management service needs for post hospital transition for care coordination.  Review of patient's electronic medical record reveals patient is for home with home health.  Patient with noted [facility] history with [facility].   Call attempts to speak with patient was not successful.  Able to leave a HIPAA appropriate voicemail message to the phone number provided to a message stating '[place]'.  Requested a return call with information left on voicemail.   Plan:  Continue to follow progress and disposition to assess for post hospital community care coordination/management needs.  Referral request for community care coordination: pending follow up return call back.  Of note, [facility] Care Management/Population Health does not replace or interfere with any arrangements made by the Inpatient Transition of Care team.  For questions contact:   Charlesetta Shanks, RN BSN CCM [facility]  [phone] business mobile phone Toll free office [phone]  *Concierge Line  [phone] Fax number: [phone] [name]

## 2022-08-23 NOTE — Plan of Care (Signed)

## 2022-08-23 NOTE — Telephone Encounter (Signed)
Patient was assigned to Dr. Chales Abrahams, when she was last seen by Doug Sou

## 2022-08-23 NOTE — TOC Transition Note (Signed)
Transition of Care [facility]) - CM/SW Discharge Note   Patient Details  Name: [name] MRN: [id number] Date of Birth: October 12, 1945  Transition of Care [facility]) CM/SW Contact:  Kermit Balo, RN Phone Number: 08/23/2022, 10:07 AM   Clinical Narrative:    Pt is discharging home with resumption of home health services through [facility]. Information on the AVS. BSC for home delivered to the room per [employer].  Pt has transportation home.   Final next level of care: Home w Home Health Services Barriers to Discharge: No Barriers Identified   Patient Goals and CMS Choice CMS Medicare.gov Compare Post Acute Care list provided to:: Patient Choice offered to / list presented to : Patient, Adult Children  Discharge Placement                         Discharge Plan and Services Additional resources added to the After Visit Summary for     Discharge Planning Services: CM Consult Post Acute Care Choice: Home Health, Durable Medical Equipment          DME Arranged: Bedside commode DME Agency: [employer] Date DME Agency Contacted: 08/22/22   Representative spoke with at DME Agency: Barbara Cower HH Arranged: OT HH Agency: [facility] Date [facility] Agency Contacted: 08/22/22   Representative spoke with at [facility] Agency: Marylene Land  Social Determinants of Health (SDOH) Interventions SDOH Screenings   Food Insecurity: No Food Insecurity (08/21/2022)  Housing: Low Risk  (08/21/2022)  Transportation Needs: No Transportation Needs (08/21/2022)  Utilities: Not At Risk (08/21/2022)  Alcohol Screen: Low Risk  (06/04/2018)  Depression (PHQ2-9): Low Risk  (08/15/2022)  Financial Resource Strain: Low Risk  (05/29/2017)  Physical Activity: Inactive (05/29/2017)  Social Connections: Moderately Integrated (05/29/2017)  Stress: No Stress Concern Present (05/29/2017)  Tobacco Use: Low Risk  (08/21/2022)     Readmission Risk Interventions    07/03/2022   12:13 PM  Readmission Risk  Prevention Plan  Transportation Screening Complete  Medication Review (RN Care Manager) Not Complete  PCP or Specialist appointment within 3-5 days of discharge Complete  SW Recovery Care/Counseling Consult Complete  Palliative Care Screening Not Complete  Comments patient will go to skilled nursing  Skilled Nursing Facility Complete

## 2022-08-23 NOTE — Plan of Care (Signed)

## 2022-08-23 NOTE — Discharge Summary (Signed)
Physician Discharge Summary  [name] WUJ:[id number] DOB: 1945/12/08 DOA: 08/21/2022  PCP: Frederica Kuster, MD  Admit date: 08/21/2022 Discharge date: 08/23/2022 Admitted From: Home Disposition: Home Recommendations for Outpatient Follow-up:  Follow up with PCP in 1 to 2 weeks Check CMP and CBC at follow-up Consider referral to general surgery for questionable appendiceal mucocele May need follow-up imaging for C6 lytic lesion Discontinued Jardiance due to history of recurrent UTI.  Reassess glycemic control Please follow up on the following pending results: None  Home Health: PT/OT/RN/ST Equipment/Devices: Bedside commode  Discharge Condition: Stable CODE STATUS: Full code  Follow-up Information     Tia Alert, MD. Schedule an appointment as soon as possible for a visit in 2 week(s).   Specialty: Neurosurgery Contact information: [address] [place] [address] [phone]         Frederica Kuster, MD. Schedule an appointment as soon as possible for a visit in 1 week(s).   Specialties: Family Medicine, Emergency Medicine Contact information: [address] [place] [address] [phone]                 Hospital course 76 year old F with PMH of NASH cirrhosis s/p TI PS, diastolic CHF, DM-2, CVA, anemia, UTIs, lumbar fusion, chronic edema, HLD and anxiety presenting with accidental fall at home after she lost her balance and fell backward hitting her head on sewing machine.  She denies LOC.  Denies new medication.  CT head showed left 7 mm left SDH with small volume adjacent SAH and lytic lesion at C6 vertebral body posterior element.  CT abdomen and pelvis showed acute nondisplaced L1, L2 and L3 transverse process fractures, cirrhosis with sequela of portal HTN and patent TIPS, dilated fluid-filled mild to distal appendix measuring up to 2.3 cm (previously 1.9 cm) without surrounding inflammatory change.  Urinalysis  concerning for UTI.  Neurosurgery consulted.  Urine culture obtained.  Patient was started on IV ceftriaxone and admitted.    Next day, headache resolved.  CT head with stable SDH/SAH.  Cleared for discharge by neurosurgery for outpatient follow-up from intracranial bleeding standpoint.  Aspirin discontinued .   On the day of discharge, patient continued to do well without headache or focal neurosymptoms.  Urine culture ESBL Klebsiella pneumonia.  She continued to denies UTI symptoms.  Received p.o. fosfomycin x 1.  Jardiance discontinued due to risk of UTI.  Advised to use her Evaristo Bury that she has not been doing prior to hospitalization.  HH PT/OT and bedside commode ordered as recommended by therapy.  Also resumed home health RN and speech therapy.   See individual problem list below for more.   Problems addressed during this hospitalization Principal Problem:   Traumatic subdural hematoma Active Problems:   Subarachnoid hemorrhage   Fall at home   Lumbar transverse process fracture   Acute lower UTI   Wound of left leg   Hypokalemia   Pancytopenia   Liver cirrhosis secondary to NASH   DM2 (diabetes mellitus, type 2)   Chronic diastolic CHF (congestive heart failure)   Prolonged QT interval   Mucocele of appendix   Weight loss   Accidental fall at home: Looks like mechanical fall from not using her rollator consistently. Acute traumatic subdural hematoma and subarachnoid hemorrhage due to fall at home: CT head showed acute left SDH with adjacent small volume SAH.  Headache and neck pain resolved.  No focal neurodeficit.  Repeat CT with stable SDH and  SAH. -Discontinued aspirin -Outpatient follow-up with neurosurgery -HH PT/OT and DME ordered.  Nondisplaced lumbar transverse process fracture 2/2 to fall at home-L1-L3.  -Pain controlled on therapy.   NASH cirrhosis s/p TIPS -Continue rifaximin, furosemide, and lactulose   Bacteriuria versus UTI: Urine culture with ESBL  Klebsiella pneumonia.  Patient denies any Symptoms -Give p.o. fosfomycin x 1 prior to discharge -Discontinued Jardiance   Wound of left leg: Open wound of the posterior aspect of the left leg with no significant erythema appreciated at this time draining serosanguineous fluid.  Patient notes this is the same leg  that she had cellulitis previously. -Continue wound care.   Chronic diastolic CHF: Last TTE with LVEF of 60 to 65%, G2 DD and normal RVSP.  Appears euvolemic on exam. -Continue home diuretics   Pancytopenia: Could be due to cirrhosis and UTI. -Recheck CBC at follow-up.     Uncontrolled IDDM-2 with hyperglycemia: A1c 8.8%. -Advised to continue using [place] and home Humalog. -Discontinued Jardiance due to risk of UTI.   Suspected appendiceal mucocele: CT showed distal appendiceal dilation measuring 2.3 cm (four 1.9 cm about 4 months ago).  Benign exam. -Consider ambulatory referral to general surgery outpatient   Lytic lesion of left C6 vertebral body posterior elements: Incidental. Per radiology, may be a benign lesion such as a vertebral venous malformation; however, a metastasis could have a similar appearance. If the patient has a known malignancy, recommend MRI of the cervical spine with contrast. However, patient without known  malignancy -Outpatient follow-up.   Hypokalemia: Resolved.   Prolonged QT interval: QTc 544. -Minimize or avoid QT prolonging drugs.   Weight loss: Reported 30 pounds weight loss in the last 2 months Body mass index is 21.09 kg/m.    Wt Readings from Last 10 Encounters:  08/21/22 54 kg  08/03/22 54.3 kg  07/19/22 55.8 kg  07/03/22 64 kg  05/18/22 58.1 kg  03/24/22 56.2 kg  03/07/22 57 kg  02/25/22 57.2 kg  02/18/22 58.1 kg  02/17/22 57.4 kg  -Consult dietitian           Time spent 45 minutes  Vital signs Vitals:   08/22/22 2013 08/23/22 0011 08/23/22 0448 08/23/22 0839  BP: (!) 129/58 (!) 122/52 (!) 120/54 116/61  Pulse:  76 81 72 69  Temp: 98.2 F (36.8 C) 98.1 F (36.7 C) 98.5 F (36.9 C) 98.4 F (36.9 C)  Resp: Height:      Weight:      SpO2: 98% 96% 97% 96%  TempSrc: Oral Oral Oral Oral  BMI (Calculated):         Discharge exam  GENERAL: No apparent distress.  Nontoxic. HEENT: MMM.  Vision and hearing grossly intact.  NECK: Supple.  No apparent JVD.  RESP:  No IWOB.  Fair aeration bilaterally. CVS:  RRR. Heart sounds normal.  ABD/GI/GU: BS+. Abd soft, NTND.  MSK/EXT:  Moves extremities. No apparent deformity. No edema.  SKIN: Skin break over posterior aspect of left leg. NEURO: Awake and alert. Oriented appropriately.  No apparent focal neuro deficit. PSYCH: Calm. Normal affect.   Discharge Instructions Discharge Instructions     Diet - low sodium heart healthy   Complete by: As directed    Diet Carb Modified   Complete by: As directed    Discharge instructions   Complete by: As directed    It has been a pleasure taking care of you!  You were hospitalized due to fall of bleeding  in your head and urinary tract infection.  Please follow-up with neurosurgery about bleeding in your head.  Stop taking aspirin.  In regards to urinary tract infection, we are discharging you more antibiotics to complete treatment course.  We have stopped the Jardiance since it would increase urinary tract infection.  We recommend using your Evaristo Bury and short acting insulin.  Please review your new medication list and the directions on your medications before you take them.  Follow-up with your primary care doctor in 1 to 2 weeks or sooner if needed.  Follow-up with neurosurgery per their recommendation.  CT showed small lesion in your neck bone (C6) and  abnormality within your appendix. These need to be monitored closely outpatient. Please discuss with your primary care doctor at your follow up.    Take care,   Discharge wound care:   Complete by: As directed    Wound care  Daily      Comments:  Please bandaged wound of the posterior leg of the left leg and change dressings daily   Increase activity slowly   Complete by: As directed       Allergies as of 08/23/2022       Reactions   Kiwi Extract Anaphylaxis   Gabapentin Nausea And Vomiting   Tdap [tetanus-diphth-acell Pertussis] Swelling, Other (See Comments)   Swelling at injection site, gets very hot   Latex Itching, Dermatitis, Rash   Statins Other (See Comments)   RHABDOMYOLYSIS   Tramadol Nausea And Vomiting        Medication List     STOP taking these medications    aspirin EC 81 MG tablet   empagliflozin 25 MG Tabs tablet Commonly known as: Jardiance   LACTOBACILLUS PO   meclizine 25 MG tablet Commonly known as: ANTIVERT   melatonin 3 MG Tabs tablet   nystatin cream Commonly known as: MYCOSTATIN   ondansetron 4 MG/2ML Soln injection Commonly known as: ZOFRAN   oxyCODONE 5 MG immediate release tablet Commonly known as: Oxy IR/ROXICODONE       TAKE these medications    acetaminophen 500 MG tablet Commonly known as: TYLENOL Take 500 mg by mouth every 6 (six) hours as needed for moderate pain.   B-D ULTRAFINE III SHORT PEN 31G X 8 MM Misc Generic drug: Insulin Pen Needle Use to give Insulin injections daily. Dx: E11.65   Biotin [address] MCG Tabs Take 10 mg by mouth daily.   CALCIUM 600 + D PO Take 1 tablet by mouth in the morning and at bedtime.   ezetimibe 10 MG tablet Commonly known as: ZETIA Take 1 tablet (10 mg total) by mouth daily.   fish oil-omega-3 fatty acids 1000 MG capsule Take 1 g by mouth daily.   fluticasone 50 MCG/ACT nasal spray Commonly known as: FLONASE Place 1 spray into both nostrils daily as needed for allergies or rhinitis.   furosemide 20 MG tablet Commonly known as: LASIX Take 40 mg by mouth daily.   glucose blood test strip One Touch Ultra Blue test strips. Use to test blood sugar three times daily. Dx: E11.65   insulin lispro 100 UNIT/ML  injection Commonly known as: HUMALOG Inject 0-0.12 mLs (0-12 Units total) into the skin See admin instructions. Before each meal 3 times a day, 140-199 - 2 units, 200-250 - 4 units, 251-299 - 6 units,  300-349 - 8 units,  350 or above 10 units.   INSULIN SYRINGE 1CC/31GX5/16" 31G X 5/16" 1 ML Misc USE AS DIRECTED  TO INJECT LEVIMIR   lactulose 10 GM/15ML solution Commonly known as: CHRONULAC TAKE 30 ML BY MOUTH EVERY DAY What changed: See the new instructions.   MAGNESIUM PO Take 400 mg by mouth daily.   pantoprazole 40 MG tablet Commonly known as: PROTONIX TAKE 1 TABLET(40 MG) BY MOUTH DAILY   potassium chloride 10 MEQ tablet Commonly known as: KLOR-CON M Take 20 mEq by mouth 2 (two) times daily.   rifaximin 550 MG Tabs tablet Commonly known as: XIFAXAN Take 550 mg by mouth 2 (two) times daily.   SYSTANE OP Place 1 drop into both eyes at bedtime.   Evaristo Bury FlexTouch 100 UNIT/ML FlexTouch Pen Generic drug: insulin degludec Inject 20 Units into the skin at bedtime.   VITAMIN B 12 PO Take 1,000 mcg by mouth daily.   Vitamin D-3 25 MCG (1000 UT) Caps Take 1,000 Units by mouth daily.               Durable Medical Equipment  (From admission, onward)           Start     Ordered   08/22/22 1342  For home use only DME Bedside commode  Once       Question:  Patient needs a bedside commode to treat with the following condition  Answer:  SDH (subdural hematoma)   08/22/22 1341              Discharge Care Instructions  (From admission, onward)           Start     Ordered   08/23/22 0000  Discharge wound care:       Comments: Wound care  Daily      Comments: Please bandaged wound of the posterior leg of the left leg and change dressings daily   08/23/22 0806            Consultations: Neurosurgery  Procedures/Studies:   CT HEAD WO CONTRAST ( )  Result Date: 08/22/2022 CLINICAL DATA:  Subarachnoid hemorrhage EXAM: CT HEAD WITHOUT  CONTRAST TECHNIQUE: Contiguous axial images were obtained from the base of the skull through the vertex without intravenous contrast. RADIATION DOSE REDUCTION: This exam was performed according to the departmental dose-optimization program which includes automated exposure control, adjustment of the mA and/or kV according to patient size and/or use of iterative reconstruction technique. COMPARISON:  CT Head 08/21/22 FINDINGS: Brain: Redemonstrated subdural hematoma along the anterior falx and along the left tentorial leaflet with adjacent subarachnoid hemorrhage. No CT evidence of an acute infarct. No hydrocephalus. No evidence of intraventricular blood products. No mass effect. Vascular: No hyperdense vessel or unexpected calcification. Skull: Soft tissue hematoma along the left occipital scalp without evidence of an underlying calvarial fracture. Sinuses/Orbits: No middle ear or mastoid effusion. Paranasal sinuses are clear. Orbits are unremarkable. Other: None. IMPRESSION: 1. Unchanged subdural hematoma along the anterior falx and left tentorial leaflet with adjacent subarachnoid hemorrhage. No mass effect. 2. Soft tissue hematoma along the left occipital scalp without evidence of an underlying calvarial fracture. Electronically Signed   By: Lorenza Cambridge M.D.   On: 08/22/2022 11:16   CT CHEST ABDOMEN PELVIS W CONTRAST  Result Date: 08/21/2022 CLINICAL DATA:  Fall. EXAM: CT CHEST, ABDOMEN, AND PELVIS WITH CONTRAST TECHNIQUE: Multidetector CT imaging of the chest, abdomen and pelvis was performed following the standard protocol during bolus administration of intravenous contrast. RADIATION DOSE REDUCTION: This exam was performed according to the departmental dose-optimization program which includes automated exposure control, adjustment of the  mA and/or kV according to patient size and/or use of iterative reconstruction technique. CONTRAST:  75mL OMNIPAQUE IOHEXOL 350 MG/ML SOLN COMPARISON:  CT abdomen pelvis  dated May 28, 2022. CT chest dated August 20, 2019. FINDINGS: CT CHEST FINDINGS Cardiovascular: No significant vascular findings. Mild cardiomegaly. Prior CABG. No pericardial effusion. Mediastinum/Nodes: No enlarged mediastinal, hilar, or axillary lymph nodes. Thyroid gland, trachea, and esophagus demonstrate no significant findings. Lungs/Pleura: No focal consolidation, pleural effusion, or pneumothorax. Musculoskeletal: No acute or significant osseous findings. CT ABDOMEN PELVIS FINDINGS Hepatobiliary: Cirrhotic liver again noted without focal abnormality. Patent TIPS with unchanged focal dilatation of the right hepatic vein just distal to the shunt. Mildly distended gallbladder without radiopaque gallstones or wall thickening. No biliary dilatation. Pancreas: Atrophic. No pancreatic ductal dilatation or surrounding inflammatory changes. Spleen: Unchanged splenomegaly. Adrenals/Urinary Tract: Adrenal glands are unremarkable. Kidneys are normal, without renal calculi, focal lesion, or hydronephrosis. Bladder is unremarkable. Stomach/Bowel: Stomach is within normal limits. No evidence of bowel wall thickening, distention, or inflammatory changes. Mobile cecum again noted. The mid to distal appendix is dilated and fluid-filled, measuring up to 2.3 cm in diameter (series 3, image 85), previously 1.9 cm in January 2024, and 1.3 cm in January 2023. No surrounding inflammatory change. Vascular/Lymphatic: Aortic atherosclerosis. Embolization coils in the gastrohepatic ligament again noted. No enlarged abdominal or pelvic lymph nodes. Reproductive: Status post hysterectomy. No adnexal masses. Other: No abdominal wall hernia or abnormality. No abdominopelvic ascites. No pneumoperitoneum. Musculoskeletal: Acute nondisplaced fractures of the left L1, L2, and L3 transverse processes. IMPRESSION: CHEST: 1. No acute traumatic injury. ABDOMEN AND PELVIS: 1. Acute nondisplaced fractures of the left L1-L2, and L3 transverse  processes. 2. Cirrhosis with sequelae of portal hypertension. Patent TIPS. 3. Dilated and fluid-filled mid to distal appendix, measuring up to 2.3 cm in diameter, previously 1.9 cm in January. No surrounding inflammatory change. 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 Surgical consultation is recommended. 4.  Aortic Atherosclerosis (ICD10-I70.0). Electronically Signed   By: Obie Dredge M.D.   On: 08/21/2022 15:09   CT Head Wo Contrast  Result Date: 08/21/2022 CLINICAL DATA:  Head trauma, minor (Age >= 65y); Neck trauma (Age >= 65y) EXAM: CT HEAD WITHOUT CONTRAST CT CERVICAL SPINE WITHOUT CONTRAST TECHNIQUE: Multidetector CT imaging of the head and cervical spine was performed following the standard protocol without intravenous contrast. Multiplanar CT image reconstructions of the cervical spine were also generated. RADIATION DOSE REDUCTION: This exam was performed according to the departmental dose-optimization program which includes automated exposure control, adjustment of the mA and/or kV according to patient size and/or use of iterative reconstruction technique. COMPARISON:  CT head 05/28/2022. FINDINGS: CT HEAD FINDINGS Brain: Acute subdural hematoma along the left anterior falx measuring 3 mm in thickness. Additional acute subdural hemorrhage along the left tentorial leaflet measuring up to 7 mm in thickness. Small volume of adjacent acute subarachnoid hemorrhage along the posterior left temporal convexity. No significant mass effect or midline shift. No evidence of acute large vascular territory infarct, mass lesion or hydrocephalus. Vascular: No hyperdense vessel identified. Skull: No acute fracture. Sinuses/Orbits: Clear sinuses.  No acute orbital findings. Other: No mastoid effusions. CT CERVICAL SPINE FINDINGS Alignment: Reversal the normal cervical lordosis. No substantial sagittal subluxation. No substantial sagittal subluxation. Skull base and vertebrae: Vertebral body heights are  maintained. No evidence of acute fracture. Lytic lesion involving the left C6 vertebral body posterior elements. Soft tissues and spinal canal: No prevertebral fluid or swelling. No visible canal hematoma. Disc levels: Multilevel degenerative  disease and facet/uncovertebral hypertrophy with varying degrees of neural foraminal stenosis. Upper chest: Visualized lung apices are clear. IMPRESSION: CT head: 1. Acute subdural hemorrhage along the left anterior falx and left tentorial leaflet, measuring up to 7 mm in thickness. 2. Small volume of adjacent acute subarachnoid hemorrhage along the posterior left temporal convexity. 3. No significant mass effect or midline shift. Findings discussed with provider [facility] via telephone at 12:12 p.m. CT cervical spine: 1. No evidence of acute fracture or traumatic malalignment. 2. Lytic lesion involving the left C6 vertebral body posterior elements. This may be a benign lesion such as a vertebral venous malformation; however, a metastasis could have a similar appearance. If the patient has a known malignancy, recommend MRI of the cervical spine with contrast. Electronically Signed   By: Feliberto Harts M.D.   On: 08/21/2022 12:13   CT Cervical Spine Wo Contrast  Result Date: 08/21/2022 CLINICAL DATA:  Head trauma, minor (Age >= 65y); Neck trauma (Age >= 65y) EXAM: CT HEAD WITHOUT CONTRAST CT CERVICAL SPINE WITHOUT CONTRAST TECHNIQUE: Multidetector CT imaging of the head and cervical spine was performed following the standard protocol without intravenous contrast. Multiplanar CT image reconstructions of the cervical spine were also generated. RADIATION DOSE REDUCTION: This exam was performed according to the departmental dose-optimization program which includes automated exposure control, adjustment of the mA and/or kV according to patient size and/or use of iterative reconstruction technique. COMPARISON:  CT head 05/28/2022. FINDINGS: CT HEAD FINDINGS Brain: Acute subdural  hematoma along the left anterior falx measuring 3 mm in thickness. Additional acute subdural hemorrhage along the left tentorial leaflet measuring up to 7 mm in thickness. Small volume of adjacent acute subarachnoid hemorrhage along the posterior left temporal convexity. No significant mass effect or midline shift. No evidence of acute large vascular territory infarct, mass lesion or hydrocephalus. Vascular: No hyperdense vessel identified. Skull: No acute fracture. Sinuses/Orbits: Clear sinuses.  No acute orbital findings. Other: No mastoid effusions. CT CERVICAL SPINE FINDINGS Alignment: Reversal the normal cervical lordosis. No substantial sagittal subluxation. No substantial sagittal subluxation. Skull base and vertebrae: Vertebral body heights are maintained. No evidence of acute fracture. Lytic lesion involving the left C6 vertebral body posterior elements. Soft tissues and spinal canal: No prevertebral fluid or swelling. No visible canal hematoma. Disc levels: Multilevel degenerative disease and facet/uncovertebral hypertrophy with varying degrees of neural foraminal stenosis. Upper chest: Visualized lung apices are clear. IMPRESSION: CT head: 1. Acute subdural hemorrhage along the left anterior falx and left tentorial leaflet, measuring up to 7 mm in thickness. 2. Small volume of adjacent acute subarachnoid hemorrhage along the posterior left temporal convexity. 3. No significant mass effect or midline shift. Findings discussed with provider [facility] via telephone at 12:12 p.m. CT cervical spine: 1. No evidence of acute fracture or traumatic malalignment. 2. Lytic lesion involving the left C6 vertebral body posterior elements. This may be a benign lesion such as a vertebral venous malformation; however, a metastasis could have a similar appearance. If the patient has a known malignancy, recommend MRI of the cervical spine with contrast. Electronically Signed   By: Feliberto Harts M.D.   On: 08/21/2022 12:13    DG Pelvis 1-2 Views  Result Date: 08/21/2022 CLINICAL DATA:  Fall. EXAM: PELVIS - 1-2 VIEW COMPARISON:  April 27, 2022. FINDINGS: There is no evidence of pelvic fracture or diastasis. No pelvic bone lesions are seen. IMPRESSION: Negative. Electronically Signed   By: Lupita Raider M.D.   On: 08/21/2022 11:58  DG Chest 1 View  Result Date: 08/21/2022 CLINICAL DATA:  Fall. EXAM: CHEST  1 VIEW COMPARISON:  May 27, 2022. FINDINGS: Stable cardiomediastinal silhouette. Status post coronary bypass graft. Lungs are clear. Bony thorax is unremarkable. IMPRESSION: No active disease. Electronically Signed   By: Lupita Raider M.D.   On: 08/21/2022 11:57   DG Lumbar Spine Complete  Result Date: 08/21/2022 CLINICAL DATA:  Fall. EXAM: LUMBAR SPINE - COMPLETE 4+ VIEW COMPARISON:  July 28, 2022. FINDINGS: No fracture or spondylolisthesis is noted. Status post surgical posterior fusion of L3, L4 and L5. Interbody fusion is noted at L3-4. Moderate degenerative disc disease is noted at L4-5 and L5-S1. IMPRESSION: Postsurgical and degenerative changes as described above. No acute abnormality seen. Electronically Signed   By: Lupita Raider M.D.   On: 08/21/2022 11:56   DG Tibia/Fibula Left  Result Date: 08/21/2022 CLINICAL DATA:  Fall. EXAM: LEFT TIBIA AND FIBULA - 2 VIEW COMPARISON:  None Available. FINDINGS: There is no evidence of fracture or other focal bone lesions. Soft tissues are unremarkable. IMPRESSION: Negative. Electronically Signed   By: Lupita Raider M.D.   On: 08/21/2022 11:54       The results of significant diagnostics from this hospitalization (including imaging, microbiology, ancillary and laboratory) are listed below for reference.     Microbiology: Recent Results (from the past 240 hour(s))  Urine Culture     Status: Abnormal   Collection Time: 08/21/22  1:38 PM   Specimen: Urine, Clean Catch  Result Value Ref Range Status   Specimen Description URINE, CLEAN CATCH   Final   Special Requests   Final    NONE Performed at [facility], [address] [place], [place] [address]    Culture (A)  Final    >=100,000 COLONIES/mL KLEBSIELLA PNEUMONIAE Confirmed Extended Spectrum Beta-Lactamase Producer (ESBL).  In bloodstream infections from ESBL organisms, carbapenems are preferred over piperacillin/tazobactam. They are shown to have a lower risk of mortality.    Report Status 08/23/2022 FINAL  Final   Organism ID, Bacteria KLEBSIELLA PNEUMONIAE (A)  Final      Susceptibility   Klebsiella pneumoniae - MIC*    AMPICILLIN >=32 RESISTANT Resistant     CEFAZOLIN >=64 RESISTANT Resistant     CEFEPIME >=32 RESISTANT Resistant     CEFTRIAXONE >=64 RESISTANT Resistant     CIPROFLOXACIN 2 RESISTANT Resistant     GENTAMICIN >=16 RESISTANT Resistant     IMIPENEM <=0.25 SENSITIVE Sensitive     NITROFURANTOIN 128 RESISTANT Resistant     TRIMETH/SULFA >=320 RESISTANT Resistant     AMPICILLIN/SULBACTAM >=32 RESISTANT Resistant     PIP/TAZO <=4 SENSITIVE Sensitive     * >=100,000 COLONIES/mL KLEBSIELLA PNEUMONIAE     Labs:  CBC: Recent Labs  Lab 08/21/22 1211 08/22/22 0333 08/23/22 0424  WBC 4.3 3.5* 3.9*  NEUTROABS 3.0  --   --   HGB 11.2* 10.3* 10.5*  HCT 35.5* 32.0* 31.0*  MCV 97.5 96.1 93.4  PLT 134* 118* 103*   BMP &GFR Recent Labs  Lab 08/21/22 1211 08/22/22 0333 08/23/22 0424  NA 138 138 134*  K 3.4* 4.1 3.4*  CL 103 108 106  CO2 23 25 23   GLUCOSE 188* 243* 203*  BUN 18 16 15   CREATININE 0.69 0.76 0.67  CALCIUM 9.6 8.7* 8.4*  MG  --   --  1.8  PHOS  --   --  2.3*   Estimated Creatinine Clearance: 50.3 mL/min (  by C-G formula based on SCr of 0.67 mg/dL). Liver & Pancreas: Recent Labs  Lab 08/21/22 1211 08/22/22 0333 08/23/22 0424  AST 49* 55*  --   ALT 42 39  --   ALKPHOS 87 76  --   BILITOT 1.3* 1.3*  --   PROT 7.1 6.1*  --   ALBUMIN 2.9* 2.4* 2.2*   No results for input(s): "LIPASE", "AMYLASE" in the last 168  hours. Recent Labs  Lab 08/21/22 1757  AMMONIA 22   Diabetic: No results for input(s): "HGBA1C" in the last 72 hours. Recent Labs  Lab 08/22/22 0614 08/22/22 1219 08/22/22 1646 08/22/22 2016 08/23/22 0834  GLUCAP 201* 461* 268* 163* 198*   Cardiac Enzymes: No results for input(s): "CKTOTAL", "CKMB", "CKMBINDEX", "TROPONINI" in the last 168 hours. No results for input(s): "PROBNP" in the last 8760 hours. Coagulation Profile: Recent Labs  Lab 08/21/22 2111  INR 1.2   Thyroid Function Tests: No results for input(s): "TSH", "T4TOTAL", "FREET4", "T3FREE", "THYROIDAB" in the last 72 hours. Lipid Profile: No results for input(s): "CHOL", "HDL", "LDLCALC", "TRIG", "CHOLHDL", "LDLDIRECT" in the last 72 hours. Anemia Panel: No results for input(s): "VITAMINB12", "FOLATE", "FERRITIN", "TIBC", "IRON", "RETICCTPCT" in the last 72 hours. Urine analysis:    Component Value Date/Time   COLORURINE YELLOW 08/21/2022 1211   APPEARANCEUR CLEAR 08/21/2022 1211   LABSPEC 1.031 (H) 08/21/2022 1211   PHURINE 5.0 08/21/2022 1211   GLUCOSEU >=500 (A) 08/21/2022 1211   HGBUR NEGATIVE 08/21/2022 1211   BILIRUBINUR NEGATIVE 08/21/2022 1211   BILIRUBINUR Negative 03/24/2022 1031   KETONESUR NEGATIVE 08/21/2022 1211   PROTEINUR NEGATIVE 08/21/2022 1211   UROBILINOGEN 0.2 03/24/2022 1031   UROBILINOGEN 0.2 12/18/2011 1409   NITRITE POSITIVE (A) 08/21/2022 1211   LEUKOCYTESUR NEGATIVE 08/21/2022 1211   Sepsis Labs: Invalid input(s): "PROCALCITONIN", "LACTICIDVEN"   SIGNED:  Almon Hercules, MD  [facility] 08/23/2022, 2:32 PM

## 2022-08-24 ENCOUNTER — Ambulatory Visit (HOSPITAL_BASED_OUTPATIENT_CLINIC_OR_DEPARTMENT_OTHER): Payer: 59 | Admitting: General Surgery

## 2022-08-24 ENCOUNTER — Encounter: Payer: Medicare Other | Admitting: Family

## 2022-08-24 ENCOUNTER — Other Ambulatory Visit: Payer: Self-pay

## 2022-08-24 ENCOUNTER — Telehealth: Payer: Self-pay

## 2022-08-24 DIAGNOSIS — H9190 Unspecified hearing loss, unspecified ear: Secondary | ICD-10-CM

## 2022-08-24 NOTE — Transitions of Care (Post Inpatient/ED Visit) (Signed)
08/24/2022  Name: [name] MRN: [id number] DOB: January 04, 1946  Today's TOC FU Call Status: Today's TOC FU Call Status:: Successful TOC FU Call Competed  Transition Care Management Follow-up Telephone Call Discharge Facility: [facility]) Type of Discharge: Inpatient Admission Primary Inpatient Discharge Diagnosis:: "fall,subdural hematoma" How have you been since you were released from the hospital?: Better (Pt states she "rested fairly well last night-slept in recliner as its more comfortable"-appetite has been good-no GI issues. She has been up ambulating and being careful.) Any questions or concerns?: Yes Patient Questions/Concerns:: Pt wants to know if she has to take Lactulose-having regular BMs- d/c instructions states take daily-pt reprots she was taking it every other day per GI MD recommendations prior to admission Patient Questions/Concerns Addressed: Other: (patient voices she will contact GI MD to discuss- states she has been having at least 2-3 regular/formed BMs per day for past several days)  Items Reviewed: Did you receive and understand the discharge instructions provided?: Yes Dietary orders reviewed?: Yes Type of Diet Ordered:: low salt/heart healthy Do you have support at home?: Yes People in Home: spouse, child(ren), adult Name of Support/Comfort Primary Source: Sheliah Plane, daughter assisting her as well  Home Care and Equipment/Supplies: Were Home Health Services Ordered?: Yes Name of Home Health Agency:: [facility] Has Agency set up a time to come to your home?: No (pt states she spoke with staff already since returning home-they are to call her back tomorrow to set up home visit date and time) Any new equipment or medical supplies ordered?: Yes Name of Medical supply agency?: [facility] Were you able to get the equipment/medical supplies?: Yes Do you have any questions related to the use of the equipment/supplies?: No  Functional  Questionnaire: Do you need assistance with bathing/showering or dressing?: Yes Do you need assistance with meal preparation?: Yes Do you need assistance with eating?: No Do you have difficulty maintaining continence: No Do you need assistance with getting out of bed/getting out of a chair/moving?: No Do you have difficulty managing or taking your medications?: No  Follow up appointments reviewed: PCP Follow-up appointment confirmed?: Yes Date of PCP follow-up appointment?: 08/25/22 Follow-up Provider: [facility] Follow-up appointment confirmed?: Yes Date of Specialist follow-up appointment?: 09/08/22 Follow-Up Specialty Provider:: Dr. Melven Sartorius Do you need transportation to your follow-up appointment?: No (pt states her spouse and daughter will take her to appts) Do you understand care options if your condition(s) worsen?: Yes-patient verbalized understanding  SDOH Interventions Today    Flowsheet Row Most Recent Value  SDOH Interventions   Food Insecurity Interventions Intervention Not Indicated  Transportation Interventions Intervention Not Indicated      TOC Interventions Today    Flowsheet Row Most Recent Value  TOC Interventions   TOC Interventions Discussed/Reviewed TOC Interventions Discussed      Interventions Today    Flowsheet Row Most Recent Value  General Interventions   General Interventions Discussed/Reviewed General Interventions Discussed, Doctor Visits  Doctor Visits Discussed/Reviewed Doctor Visits Discussed, Specialist, PCP  PCP/Specialist Visits Compliance with follow-up visit  Education Interventions   Education Provided Provided Education  Provided Verbal Education On Nutrition, Medication, When to see the doctor, Other  Nutrition Interventions   Nutrition Discussed/Reviewed Nutrition Discussed, Adding fruits and vegetables, Decreasing salt  Pharmacy Interventions   Pharmacy Dicussed/Reviewed Pharmacy Topics  Discussed, Medications and their functions  Safety Interventions   Safety Discussed/Reviewed Safety Discussed, Fall Risk, Home Safety  Home Safety Assistive Devices       Mora,  RN,BSN,CCM [facility] Care Management Care Management Community Coordinator Direct Phone: [phone] Toll Free: [phone] Fax: [phone]

## 2022-08-25 ENCOUNTER — Encounter: Payer: Medicare Other | Admitting: Family

## 2022-08-25 NOTE — Progress Notes (Signed)
  This encounter was created in error - please disregard. No show 

## 2022-08-26 ENCOUNTER — Telehealth: Payer: Medicare Other

## 2022-08-26 ENCOUNTER — Ambulatory Visit (INDEPENDENT_AMBULATORY_CARE_PROVIDER_SITE_OTHER): Payer: Medicare Other | Admitting: Family

## 2022-08-26 ENCOUNTER — Encounter: Payer: Self-pay | Admitting: Family

## 2022-08-26 VITALS — BP 122/68 | HR 72 | Temp 97.7°F | Resp 16 | Wt 122.6 lb

## 2022-08-26 DIAGNOSIS — E876 Hypokalemia: Secondary | ICD-10-CM | POA: Diagnosis not present

## 2022-08-26 DIAGNOSIS — K388 Other specified diseases of appendix: Secondary | ICD-10-CM | POA: Diagnosis not present

## 2022-08-26 DIAGNOSIS — K7460 Unspecified cirrhosis of liver: Secondary | ICD-10-CM | POA: Diagnosis not present

## 2022-08-26 DIAGNOSIS — R2681 Unsteadiness on feet: Secondary | ICD-10-CM

## 2022-08-26 DIAGNOSIS — L89893 Pressure ulcer of other site, stage 3: Secondary | ICD-10-CM | POA: Diagnosis not present

## 2022-08-26 DIAGNOSIS — I5033 Acute on chronic diastolic (congestive) heart failure: Secondary | ICD-10-CM | POA: Diagnosis not present

## 2022-08-26 DIAGNOSIS — Z794 Long term (current) use of insulin: Secondary | ICD-10-CM

## 2022-08-26 DIAGNOSIS — M5442 Lumbago with sciatica, left side: Secondary | ICD-10-CM

## 2022-08-26 DIAGNOSIS — E1159 Type 2 diabetes mellitus with other circulatory complications: Secondary | ICD-10-CM

## 2022-08-26 DIAGNOSIS — G8929 Other chronic pain: Secondary | ICD-10-CM

## 2022-08-26 DIAGNOSIS — E1122 Type 2 diabetes mellitus with diabetic chronic kidney disease: Secondary | ICD-10-CM | POA: Diagnosis not present

## 2022-08-26 DIAGNOSIS — R188 Other ascites: Secondary | ICD-10-CM

## 2022-08-26 DIAGNOSIS — I5032 Chronic diastolic (congestive) heart failure: Secondary | ICD-10-CM

## 2022-08-26 DIAGNOSIS — L03116 Cellulitis of left lower limb: Secondary | ICD-10-CM | POA: Diagnosis not present

## 2022-08-26 DIAGNOSIS — N1831 Chronic kidney disease, stage 3a: Secondary | ICD-10-CM | POA: Diagnosis not present

## 2022-08-26 DIAGNOSIS — I130 Hypertensive heart and chronic kidney disease with heart failure and stage 1 through stage 4 chronic kidney disease, or unspecified chronic kidney disease: Secondary | ICD-10-CM | POA: Diagnosis not present

## 2022-08-26 DIAGNOSIS — I480 Paroxysmal atrial fibrillation: Secondary | ICD-10-CM

## 2022-08-26 DIAGNOSIS — S81802D Unspecified open wound, left lower leg, subsequent encounter: Secondary | ICD-10-CM

## 2022-08-26 DIAGNOSIS — M899 Disorder of bone, unspecified: Secondary | ICD-10-CM

## 2022-08-26 LAB — GLUCOSE, CAPILLARY
Glucose-Capillary: 181 mg/dL — ABNORMAL HIGH (ref 70–99)
Glucose-Capillary: 234 mg/dL — ABNORMAL HIGH (ref 70–99)

## 2022-08-26 MED ORDER — UNILET COMFORTOUCH LANCET MISC
1.0000 | Freq: Two times a day (BID) | 5 refills | Status: DC
Start: 2022-08-26 — End: 2022-09-14

## 2022-08-26 NOTE — Telephone Encounter (Addendum)
[name] with [facility] called requesting verbal orders for skilled nursing 2 times a week for 6 weeks in addition to Home helath aid 1 time a week for 4 weeks  Verbal orders were given

## 2022-08-26 NOTE — Progress Notes (Unsigned)
Provider: Zsazsa Bahena FNP-C   Frederica Kuster, MD  Patient Care Team: Frederica Kuster, MD as PCP - General (Family Medicine) Wendall Stade, MD as PCP - Cardiology (Cardiology) Pati Gallo, MD as Consulting Physician (Family Medicine) Kennon Rounds as Physician Assistant (Cardiology) Nelson Chimes, MD as Consulting Physician (Ophthalmology) Rachael Fee, MD as Attending Physician (Gastroenterology) Janalyn Harder, MD (Inactive) as Consulting Physician (Dermatology)  Extended Emergency Contact Information Primary Emergency Contact: Mendenhall,[name] Address: [address]          [place], [place] [address] D[place] of [place] Home Phone: [phone] Mobile Phone: [phone] Relation: Son Secondary Emergency Contact: Lysle Rubens Address: [address]          [place], [place] [address] [place] of [place] Home Phone: [phone] Mobile Phone: [phone] Relation: Spouse  Code Status:  Full Code  Goals of care: Advanced Directive information    08/21/2022    3:00 PM  Advanced Directives  Does Patient Have a Medical Advance Directive? No  Would patient like information on creating a medical advance directive? No - Patient declined     Chief Complaint  Patient presents with   Hospitalization Follow-up    Got dizzy and fell back. Has not been on Humalog at all- was never given rx for it, only on tresiba for DM- has left urine for urine micro    HPI:  Pt is a 77 y.o. female seen today for follow-up post hospitalization from 08/21/2022  - 08/23/2022 after presenting to ED with accidental fall at home after she lost her balance and fell backward hitting head on sewing machine.  She did not lose consciousness.  CT of the head showed left 7 mm left SDH with small volume Adjacent SAH and lytic lesion at C6 vertebral body posterior element.  Also had CT of the abdomen and pelvis which showed nondisplaced L1, L2 and L3 transverse process fractures, cirrhosis  with sequela of portal HTH and Parten TIPS, dilated fluid-filled mild to distal appendix measuring up to 2.3 cm [previously 1.9 cm] no surrounding inflammation noted.  Neurosurgery was consulted.  Outpatient follow-up for intracranial bleeding.  Aspirin was discontinued. Her urine was concerning for UTI urine culture done which showed ESBL Klebsiella pneumonia. she was treated with IV ceftriaxone.  She was also treated with p.o. fosfomycin x 1.  Jaundice was discontinued due to high risks of urinary tract infection.  She was advised to use her Evaristo Bury which she had not been using prior to hospitalization. K+ was 3.4 was replete. She was discharged home with home health physical therapy/Occupational Therapy/RN/ST.  Bedside commode was ordered which she states she has already received.  States has upcoming appointment with the neurosurgery and orthopedic specialist noted.  Past Medical History:  Diagnosis Date   Chronic cystitis    Chronic diastolic CHF (congestive heart failure)    followed by dr Eden Emms   Chronic kidney disease, stage I    Cirrhosis of liver without ascites 03/2016   followed by dr Christella Hartigan (GI);   due to fatty liver;   post op cabg 09/ 2017  , post paracentesis for ascites   Coronary artery disease 01/2016   cardiologist--- dr Estrella Myrtle;   01-27-2016  NSTEMI  s/p cardiac cath;   s/p CABG 01/29/16: LIMA-LAD, SVG-RI, SVG-dRCA   DDD (degenerative disc disease), cervical    per pt w/ chronic neck pain   Dyspnea    occasional   Edema of right lower extremity  Esophageal varices    followed by dr Christella Hartigan;   hx multiple banding of large varices   Essential hypertension    no meds   GERD (gastroesophageal reflux disease)    Heart murmur     DX FOR YEARS ASYMPTOMATIC   Hepatic encephalopathy    History of adenomatous polyp of colon    History of COVID-19 04/2020   per pt mild  symptoms that resolve   History of kidney stones    passed stones   History of non-ST elevation  myocardial infarction (NSTEMI) 01/27/2016   Hyperlipidemia    diet controlled   NAFLD (nonalcoholic fatty liver disease)    Neuropathy, peripheral 04/01/2015   OA (osteoarthritis)    Paroxysmal A-fib    08/19/19 afib with RVR, spontaneously converted to SR   Portal vein thrombosis    per dr Christella Hartigan  ,  chronic non-occluded   Thrombocytopenia    followed by pcp  and hematology-- dr Mosetta Putt;   likely secondary to liver cirrhosis   Type 2 diabetes mellitus treated with insulin    Type 2, followed by pcp    (07-21-2021  per pt checks blood sugar dialy in am fasting average 113--200s)   Urge incontinence of urine    Wears hearing aid in both ears    Past Surgical History:  Procedure Laterality Date   CARDIAC CATHETERIZATION N/A 01/27/2016   Procedure: Left Heart Cath and Coronary Angiography;  Surgeon: Lyn Records, MD;  Location: [facility] INVASIVE CV LAB;  Service: Cardiovascular;  Laterality: N/A;   COLONOSCOPY WITH PROPOFOL N/A 07/07/2016   Procedure: COLONOSCOPY WITH PROPOFOL;  Surgeon: Rachael Fee, MD;  Location: [facility] ENDOSCOPY;  Service: Endoscopy;  Laterality: N/A;   CORONARY ARTERY BYPASS GRAFT N/A 01/28/2016   Procedure: CORONARY ARTERY BYPASS GRAFTING (CABG) x 3 USING RIGHT LEG GREATER SAPHENOUS VEIN GRAFT;  Surgeon: Loreli Slot, MD;  Location: [facility] OR;  Service: Open Heart Surgery;  Laterality: N/A;   CYSTOSCOPY WITH RETROGRADE PYELOGRAM, URETEROSCOPY AND STENT PLACEMENT Left 05/30/2021   Procedure: CYSTOSCOPY WITH RETROGRADE PYELOGRAM, URETEROSCOPY AND STENT PLACEMENT;  Surgeon: Rene Paci, MD;  Location[facility] OR;  Service: Urology;  Laterality: Left;   CYSTOSCOPY/URETEROSCOPY/HOLMIUM LASER/STENT PLACEMENT Left 07/26/2021   Procedure: CYSTOSCOPY/RETROGRADE/URETEROSCOPY/HOLMIUM LASER/STENT EXCHANGE;  Surgeon: Jannifer Hick, MD;  Location: [facility];  Service: Urology;  Laterality: Left;   ENDOVEIN HARVEST OF GREATER SAPHENOUS VEIN Right 01/28/2016    Procedure: ENDOVEIN HARVEST OF GREATER SAPHENOUS VEIN;  Surgeon: Loreli Slot, MD;  Location: [facility] OR;  Service: Open Heart Surgery;  Laterality: Right;   ESOPHAGEAL BANDING  03/28/2019   Procedure: ESOPHAGEAL BANDING;  Surgeon: Rachael Fee, MD;  Location: [facility] ENDOSCOPY;  Service: Endoscopy;;   ESOPHAGEAL BANDING  04/07/2019   Procedure: ESOPHAGEAL BANDING;  Surgeon: Charna Peyson, MD;  Location: [facility] ENDOSCOPY;  Service: Endoscopy;;   ESOPHAGOGASTRODUODENOSCOPY N/A 04/07/2019   Procedure: ESOPHAGOGASTRODUODENOSCOPY (EGD);  Surgeon: Charna Uzma, MD;  Location: [facility] ENDOSCOPY;  Service: Endoscopy;  Laterality: N/A;   ESOPHAGOGASTRODUODENOSCOPY (EGD) WITH PROPOFOL N/A 07/07/2016   Procedure: ESOPHAGOGASTRODUODENOSCOPY (EGD) WITH PROPOFOL;  Surgeon: Rachael Fee, MD;  Location: [facility] ENDOSCOPY;  Service: Endoscopy;  Laterality: N/A;   ESOPHAGOGASTRODUODENOSCOPY (EGD) WITH PROPOFOL N/A 03/28/2019   Procedure: ESOPHAGOGASTRODUODENOSCOPY (EGD) WITH PROPOFOL;  Surgeon: Rachael Fee, MD;  Location: [facility] ENDOSCOPY;  Service: Endoscopy;  Laterality: N/A;   EXTRACORPOREAL SHOCK WAVE LITHOTRIPSY  2010   HEMOSTASIS CLIP PLACEMENT  04/07/2019   Procedure: HEMOSTASIS CLIP PLACEMENT;  Surgeon: Charna Toniesha, MD;  Location: [facility] ENDOSCOPY;  Service: Endoscopy;;   IR ANGIOGRAM SELECTIVE EACH ADDITIONAL VESSEL  04/08/2019   IR EMBO ART  VEN HEMORR LYMPH EXTRAV  INC GUIDE ROADMAPPING  04/08/2019   IR PARACENTESIS  04/08/2019   IR RADIOLOGIST EVAL & MGMT  06/13/2019   IR RADIOLOGIST EVAL & MGMT  09/25/2019   IR RADIOLOGIST EVAL & MGMT  07/07/2020   IR RADIOLOGIST EVAL & MGMT  07/30/2021   IR TIPS  04/08/2019   LAMINECTOMY WITH POSTERIOR LATERAL ARTHRODESIS LEVEL 1 Bilateral 02/25/2022   Procedure: Laminectomy and Foraminotomy - Lumbar four-Lumbar five - bilateral, posterolateral arthrodesis Lumbar four-five, extension of hardware Lumbar four-five;  Surgeon: Tia Alert, MD;  Location: [facility] OR;  Service:  Neurosurgery;  Laterality: Bilateral;   MAXIMUM ACCESS (MAS)POSTERIOR LUMBAR INTERBODY FUSION (PLIF) 1 LEVEL Left 06/10/2015   Procedure: FOR MAXIMUM ACCESS (MAS) POSTERIOR LUMBAR INTERBODY FUSION (PLIF) LUMBAR THREE-FOUR EXTRAFORAMINAL MICRODISCECTOMY LUMBAR FIVE-SACRAL ONE LEFT;  Surgeon: Tia Alert, MD;  Location: [facility] NEURO ORS;  Service: Neurosurgery;  Laterality: Left;   RADIOLOGY WITH ANESTHESIA N/A 04/08/2019   Procedure: RADIOLOGY WITH ANESTHESIA;  Surgeon: Radiologist, Medication, MD;  Location: [facility] OR;  Service: Radiology;  Laterality: N/A;   SCLEROTHERAPY  04/07/2019   Procedure: SCLEROTHERAPY;  Surgeon: Charna Chaniah, MD;  Location: [facility] ENDOSCOPY;  Service: Endoscopy;;   TEE WITHOUT CARDIOVERSION N/A 01/28/2016   Procedure: TRANSESOPHAGEAL ECHOCARDIOGRAM (TEE);  Surgeon: Loreli Slot, MD;  Location: [facility] OR;  Service: Open Heart Surgery;  Laterality: N/A;   TUBAL LIGATION  1982   Dr Ninetta Lights HERNIA REPAIR N/A 09/10/2019   Procedure: HERNIA REPAIR UMBILICAL ADULT;  Surgeon: Manus Rudd, MD;  Location: [facility] OR;  Service: General;  Laterality: N/A;   VAGINAL HYSTERECTOMY  1997   Dr Vivien Rota    Allergies  Allergen Reactions   Kiwi Extract Anaphylaxis   Gabapentin Nausea And Vomiting   Tdap [Tetanus-Diphth-Acell Pertussis] Swelling and Other (See Comments)    Swelling at injection site, gets very hot   Latex Itching, Dermatitis and Rash   Statins Other (See Comments)    RHABDOMYOLYSIS   Tramadol Nausea And Vomiting    Allergies as of 08/26/2022       Reactions   Kiwi Extract Anaphylaxis   Gabapentin Nausea And Vomiting   Tdap [tetanus-diphth-acell Pertussis] Swelling, Other (See Comments)   Swelling at injection site, gets very hot   Latex Itching, Dermatitis, Rash   Statins Other (See Comments)   RHABDOMYOLYSIS   Tramadol Nausea And Vomiting        Medication List        Accurate as of August 26, 2022  2:15 PM. If you have any questions, ask your  nurse or doctor.          acetaminophen 500 MG tablet Commonly known as: TYLENOL Take 500 mg by mouth every 6 (six) hours as needed for moderate pain.   B-D ULTRAFINE III SHORT PEN 31G X 8 MM Misc Generic drug: Insulin Pen Needle Use to give Insulin injections daily. Dx: E11.65   Biotin 40981 MCG Tabs Take 10 mg by mouth daily.   CALCIUM 600 + D PO Take 1 tablet by mouth in the morning and at bedtime.   ezetimibe 10 MG tablet Commonly known as: ZETIA Take 1 tablet (10 mg total) by mouth daily.   fish oil-omega-3 fatty acids 1000 MG capsule Take 1 g by mouth daily.   fluticasone 50 MCG/ACT nasal  spray Commonly known as: FLONASE Place 1 spray into both nostrils daily as needed for allergies or rhinitis.   furosemide 20 MG tablet Commonly known as: LASIX Take 40 mg by mouth daily.   glucose blood test strip One Touch Ultra Blue test strips. Use to test blood sugar three times daily. Dx: E11.65   insulin lispro 100 UNIT/ML injection Commonly known as: HUMALOG Inject 0-0.12 mLs (0-12 Units total) into the skin See admin instructions. Before each meal 3 times a day, 140-199 - 2 units, 200-250 - 4 units, 251-299 - 6 units,  300-349 - 8 units,  350 or above 10 units.   INSULIN SYRINGE 1CC/31GX5/16" 31G X 5/16" 1 ML Misc USE AS DIRECTED TO INJECT LEVIMIR   lactulose 10 GM/15ML solution Commonly known as: CHRONULAC TAKE 30 ML BY MOUTH EVERY DAY   MAGNESIUM PO Take 400 mg by mouth daily.   pantoprazole 40 MG tablet Commonly known as: PROTONIX TAKE 1 TABLET(40 MG) BY MOUTH DAILY   potassium chloride 10 MEQ tablet Commonly known as: KLOR-CON M Take 20 mEq by mouth 2 (two) times daily.   rifaximin 550 MG Tabs tablet Commonly known as: XIFAXAN Take 550 mg by mouth 2 (two) times daily.   SYSTANE OP Place 1 drop into both eyes at bedtime.   Evaristo Bury FlexTouch 100 UNIT/ML FlexTouch Pen Generic drug: insulin degludec Inject 20 Units into the skin at bedtime.    VITAMIN B 12 PO Take 1,000 mcg by mouth daily.   Vitamin D-3 25 MCG (1000 UT) Caps Take 1,000 Units by mouth daily.        Review of Systems  Constitutional:  Negative for appetite change, chills, fatigue, fever and unexpected weight change.  HENT:  Negative for congestion, dental problem, ear discharge, ear pain, facial swelling, hearing loss, nosebleeds, postnasal drip, rhinorrhea, sinus pressure, sinus pain, sneezing, sore throat, tinnitus and trouble swallowing.   Eyes:  Negative for pain, discharge, redness, itching and visual disturbance.  Respiratory:  Negative for cough, chest tightness, shortness of breath and wheezing.   Cardiovascular:  Negative for chest pain, palpitations and leg swelling.  Gastrointestinal:  Negative for abdominal distention, abdominal pain, blood in stool, constipation, diarrhea, nausea and vomiting.  Endocrine: Negative for cold intolerance, heat intolerance, polydipsia, polyphagia and polyuria.  Genitourinary:  Negative for difficulty urinating, dysuria, flank pain, frequency and urgency.  Musculoskeletal:  Positive for arthralgias and gait problem. Negative for back pain, joint swelling, myalgias, neck pain and neck stiffness.       Status post hospitalization post fall at home   Skin:  Positive for wound. Negative for color change, pallor and rash.       Left lateral wound   Neurological:  Negative for dizziness, syncope, speech difficulty, weakness, light-headedness, numbness and headaches.  Hematological:  Does not bruise/bleed easily.  Psychiatric/Behavioral:  Negative for agitation, behavioral problems, confusion, hallucinations, self-injury, sleep disturbance and suicidal ideas. The patient is not nervous/anxious.     Immunization History  Administered Date(s) Administered   Fluad Quad(high Dose 65+) 01/31/2019, 02/03/2020, 02/17/2021, 03/07/2022   Hep A / Hep B 06/24/2016, 07/04/2016, 07/21/2016   Influenza Whole 02/28/2012   Influenza,  High Dose Seasonal PF 01/26/2017, 01/29/2018   Influenza,inj,Quad PF,6+ Mos 03/27/2013, 01/16/2014, 03/31/2015, 02/26/2016   Pneumococcal Conjugate-13 06/28/2011   Pneumococcal Polysaccharide-23 04/19/2016   Td 01/12/1999   Pertinent  Health Maintenance Due  Topic Date Due   OPHTHALMOLOGY EXAM  08/24/2018   COLONOSCOPY (Pts 45-44yrs Insurance coverage will need  to be confirmed)  08/26/2023 (Originally 07/07/2021)   INFLUENZA VACCINE  12/08/2022   FOOT EXAM  01/05/2023   HEMOGLOBIN A1C  02/03/2023   DEXA SCAN  Completed      02/25/2022    2:24 PM 03/24/2022   10:03 AM 05/18/2022    1:10 PM 08/03/2022    2:18 PM 08/15/2022    2:46 PM  Fall Risk  Falls in the past year?  1 0 0 0  Was there an injury with Fall?  0 0 0 0  Fall Risk Category Calculator  1 0 0 0  Fall Risk Category (Retired)  Low Low    (RETIRED) Patient Fall Risk Level Moderate fall risk Low fall risk Low fall risk    Patient at Risk for Falls Due to  History of fall(s) No Fall Risks No Fall Risks History of fall(s)  Fall risk Follow up  Falls evaluation completed Falls evaluation completed Falls evaluation completed Falls evaluation completed   Functional Status Survey:    Vitals:   08/26/22 1341  BP: 122/68  Pulse: 72  Resp: 16  Temp: 97.7 F (36.5 C)  TempSrc: Temporal  SpO2: 99%  Weight: 122 lb 9.6 oz (55.6 kg)   Body mass index is 21.72 kg/m. Physical Exam Vitals reviewed.  Constitutional:      General: She is not in acute distress.    Appearance: Normal appearance. She is normal weight. She is not ill-appearing or diaphoretic.  HENT:     Head: Normocephalic.     Right Ear: Tympanic membrane, ear canal and external ear normal. There is no impacted cerumen.     Left Ear: Tympanic membrane, ear canal and external ear normal. There is no impacted cerumen.     Ears:     Comments: Bilateral hearing aids in place    Nose: Nose normal. No congestion or rhinorrhea.     Mouth/Throat:     Mouth: Mucous  membranes are moist.     Pharynx: Oropharynx is clear. No oropharyngeal exudate or posterior oropharyngeal erythema.  Eyes:     General: No scleral icterus.       Right eye: No discharge.        Left eye: No discharge.     Extraocular Movements: Extraocular movements intact.     Conjunctiva/sclera: Conjunctivae normal.     Pupils: Pupils are equal, round, and reactive to light.  Neck:     Vascular: No carotid bruit.  Cardiovascular:     Rate and Rhythm: Normal rate and regular rhythm.     Pulses: Normal pulses.     Heart sounds: Normal heart sounds. No murmur heard.    No friction rub. No gallop.  Pulmonary:     Effort: Pulmonary effort is normal. No respiratory distress.     Breath sounds: Normal breath sounds. No wheezing, rhonchi or rales.  Chest:     Chest wall: No tenderness.  Abdominal:     General: Bowel sounds are normal. There is no distension.     Palpations: Abdomen is soft. There is no mass.     Tenderness: There is no abdominal tenderness. There is no right CVA tenderness, left CVA tenderness, guarding or rebound.  Musculoskeletal:        General: No swelling or tenderness. Normal range of motion.     Cervical back: Normal range of motion. No rigidity or tenderness.     Right lower leg: No edema.     Left lower leg: No edema.  Lymphadenopathy:     Cervical: No cervical adenopathy.  Skin:    General: Skin is warm and dry.     Coloration: Skin is not pale.     Findings: No erythema, lesion or rash.     Comments: Left lower back/flank area old purple bruise slight tender to palpation  Right gluteal scab noted. None tender to palpation.  Left lateral leg dime size open wound with tunneling draining purulent thick drainage without any odor.surrounding skin without any erythema or tenderness.wound cleansed with saline ,pat dry, packed with < 1" iodoform and covered with foam dressing for extra protection and absorption.    Neurological:     Mental Status: She is alert  and oriented to person, place, and time.     Cranial Nerves: No cranial nerve deficit.     Sensory: No sensory deficit.     Motor: No weakness.     Coordination: Coordination normal.     Gait: Gait abnormal.  Psychiatric:        Mood and Affect: Mood normal.        Speech: Speech normal.        Behavior: Behavior normal.        Thought Content: Thought content normal.        Judgment: Judgment normal.     Labs reviewed: Recent Labs    07/03/22 0319 07/03/22 0323 07/04/22 0411 08/03/22 1524 08/21/22 1211 08/22/22 0333 08/23/22 0424  NA  --    < > 134*   < > 138 138 134*  K  --    < > 4.2   < > 3.4* 4.1 3.4*  CL  --    < > 105   < > 103 108 106  CO2  --    < > 21*   < > 23 25 23   GLUCOSE  --    < > 191*   < > 188* 243* 203*  BUN  --    < > 11   < > 18 16 15   CREATININE  --    < > 0.72   < > 0.69 0.76 0.67  CALCIUM  --    < > 7.8*   < > 9.6 8.7* 8.4*  MG 1.8  --  1.9  --   --   --  1.8  PHOS  --   --   --   --   --   --  2.3*   < > = values in this interval not displayed.   Recent Labs    07/01/22 0143 08/03/22 1524 08/21/22 1211 08/22/22 0333 08/23/22 0424  AST 53* 31 49* 55*  --   ALT 30 21 42 39  --   ALKPHOS 116  --  87 76  --   BILITOT 2.6* 1.6* 1.3* 1.3*  --   PROT 6.6 6.8 7.1 6.1*  --   ALBUMIN 1.9*  --  2.9* 2.4* 2.2*   Recent Labs    06/30/22 1715 07/01/22 0143 07/03/22 0323 08/21/22 1211 08/22/22 0333 08/23/22 0424  WBC 11.9* 13.5*   < > 4.3 3.5* 3.9*  NEUTROABS 10.5* 11.4*  --  3.0  --   --   HGB 11.6* 12.1   < > 11.2* 10.3* 10.5*  HCT 34.6* 37.8   < > 35.5* 32.0* 31.0*  MCV 93.5 96.2   < > 97.5 96.1 93.4  PLT 166 197   < > 134* 118* 103*   < > = values  in this interval not displayed.   Lab Results  Component Value Date   TSH 2.468 05/27/2022   Lab Results  Component Value Date   HGBA1C 8.8 (H) 08/03/2022   Lab Results  Component Value Date   CHOL 174 01/04/2022   HDL 89 01/04/2022   LDLCALC 68 01/04/2022   TRIG 91 01/04/2022    CHOLHDL 2.0 01/04/2022    Significant Diagnostic Results in last 30 days:  CT HEAD WO CONTRAST ( )  Result Date: 08/22/2022 CLINICAL DATA:  Subarachnoid hemorrhage EXAM: CT HEAD WITHOUT CONTRAST TECHNIQUE: Contiguous axial images were obtained from the base of the skull through the vertex without intravenous contrast. RADIATION DOSE REDUCTION: This exam was performed according to the departmental dose-optimization program which includes automated exposure control, adjustment of the mA and/or kV according to patient size and/or use of iterative reconstruction technique. COMPARISON:  CT Head 08/21/22 FINDINGS: Brain: Redemonstrated subdural hematoma along the anterior falx and along the left tentorial leaflet with adjacent subarachnoid hemorrhage. No CT evidence of an acute infarct. No hydrocephalus. No evidence of intraventricular blood products. No mass effect. Vascular: No hyperdense vessel or unexpected calcification. Skull: Soft tissue hematoma along the left occipital scalp without evidence of an underlying calvarial fracture. Sinuses/Orbits: No middle ear or mastoid effusion. Paranasal sinuses are clear. Orbits are unremarkable. Other: None. IMPRESSION: 1. Unchanged subdural hematoma along the anterior falx and left tentorial leaflet with adjacent subarachnoid hemorrhage. No mass effect. 2. Soft tissue hematoma along the left occipital scalp without evidence of an underlying calvarial fracture. Electronically Signed   By: Lorenza Cambridge M.D.   On: 08/22/2022 11:16   CT CHEST ABDOMEN PELVIS W CONTRAST  Result Date: 08/21/2022 CLINICAL DATA:  Fall. EXAM: CT CHEST, ABDOMEN, AND PELVIS WITH CONTRAST TECHNIQUE: Multidetector CT imaging of the chest, abdomen and pelvis was performed following the standard protocol during bolus administration of intravenous contrast. RADIATION DOSE REDUCTION: This exam was performed according to the departmental dose-optimization program which includes automated exposure  control, adjustment of the mA and/or kV according to patient size and/or use of iterative reconstruction technique. CONTRAST:  75mL OMNIPAQUE IOHEXOL 350 MG/ML SOLN COMPARISON:  CT abdomen pelvis dated May 28, 2022. CT chest dated August 20, 2019. FINDINGS: CT CHEST FINDINGS Cardiovascular: No significant vascular findings. Mild cardiomegaly. Prior CABG. No pericardial effusion. Mediastinum/Nodes: No enlarged mediastinal, hilar, or axillary lymph nodes. Thyroid gland, trachea, and esophagus demonstrate no significant findings. Lungs/Pleura: No focal consolidation, pleural effusion, or pneumothorax. Musculoskeletal: No acute or significant osseous findings. CT ABDOMEN PELVIS FINDINGS Hepatobiliary: Cirrhotic liver again noted without focal abnormality. Patent TIPS with unchanged focal dilatation of the right hepatic vein just distal to the shunt. Mildly distended gallbladder without radiopaque gallstones or wall thickening. No biliary dilatation. Pancreas: Atrophic. No pancreatic ductal dilatation or surrounding inflammatory changes. Spleen: Unchanged splenomegaly. Adrenals/Urinary Tract: Adrenal glands are unremarkable. Kidneys are normal, without renal calculi, focal lesion, or hydronephrosis. Bladder is unremarkable. Stomach/Bowel: Stomach is within normal limits. No evidence of bowel wall thickening, distention, or inflammatory changes. Mobile cecum again noted. The mid to distal appendix is dilated and fluid-filled, measuring up to 2.3 cm in diameter (series 3, image 85), previously 1.9 cm in January 2024, and 1.3 cm in January 2023. No surrounding inflammatory change. Vascular/Lymphatic: Aortic atherosclerosis. Embolization coils in the gastrohepatic ligament again noted. No enlarged abdominal or pelvic lymph nodes. Reproductive: Status post hysterectomy. No adnexal masses. Other: No abdominal wall hernia or abnormality. No abdominopelvic ascites. No pneumoperitoneum. Musculoskeletal: Acute nondisplaced  fractures of the left L1, L2, and L3 transverse processes. IMPRESSION: CHEST: 1. No acute traumatic injury. ABDOMEN AND PELVIS: 1. Acute nondisplaced fractures of the left L1-L2, and L3 transverse processes. 2. Cirrhosis with sequelae of portal hypertension. Patent TIPS. 3. Dilated and fluid-filled mid to distal appendix, measuring up to 2.3 cm in diameter, previously 1.9 cm in January. No surrounding inflammatory change. 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 Surgical consultation is recommended. 4.  Aortic Atherosclerosis (ICD10-I70.0). Electronically Signed   By: Obie Dredge M.D.   On: 08/21/2022 15:09   CT Head Wo Contrast  Result Date: 08/21/2022 CLINICAL DATA:  Head trauma, minor (Age >= 65y); Neck trauma (Age >= 65y) EXAM: CT HEAD WITHOUT CONTRAST CT CERVICAL SPINE WITHOUT CONTRAST TECHNIQUE: Multidetector CT imaging of the head and cervical spine was performed following the standard protocol without intravenous contrast. Multiplanar CT image reconstructions of the cervical spine were also generated. RADIATION DOSE REDUCTION: This exam was performed according to the departmental dose-optimization program which includes automated exposure control, adjustment of the mA and/or kV according to patient size and/or use of iterative reconstruction technique. COMPARISON:  CT head 05/28/2022. FINDINGS: CT HEAD FINDINGS Brain: Acute subdural hematoma along the left anterior falx measuring 3 mm in thickness. Additional acute subdural hemorrhage along the left tentorial leaflet measuring up to 7 mm in thickness. Small volume of adjacent acute subarachnoid hemorrhage along the posterior left temporal convexity. No significant mass effect or midline shift. No evidence of acute large vascular territory infarct, mass lesion or hydrocephalus. Vascular: No hyperdense vessel identified. Skull: No acute fracture. Sinuses/Orbits: Clear sinuses.  No acute orbital findings. Other: No mastoid effusions. CT  CERVICAL SPINE FINDINGS Alignment: Reversal the normal cervical lordosis. No substantial sagittal subluxation. No substantial sagittal subluxation. Skull base and vertebrae: Vertebral body heights are maintained. No evidence of acute fracture. Lytic lesion involving the left C6 vertebral body posterior elements. Soft tissues and spinal canal: No prevertebral fluid or swelling. No visible canal hematoma. Disc levels: Multilevel degenerative disease and facet/uncovertebral hypertrophy with varying degrees of neural foraminal stenosis. Upper chest: Visualized lung apices are clear. IMPRESSION: CT head: 1. Acute subdural hemorrhage along the left anterior falx and left tentorial leaflet, measuring up to 7 mm in thickness. 2. Small volume of adjacent acute subarachnoid hemorrhage along the posterior left temporal convexity. 3. No significant mass effect or midline shift. Findings discussed with provider [facility] via telephone at 12:12 p.m. CT cervical spine: 1. No evidence of acute fracture or traumatic malalignment. 2. Lytic lesion involving the left C6 vertebral body posterior elements. This may be a benign lesion such as a vertebral venous malformation; however, a metastasis could have a similar appearance. If the patient has a known malignancy, recommend MRI of the cervical spine with contrast. Electronically Signed   By: Feliberto Harts M.D.   On: 08/21/2022 12:13   CT Cervical Spine Wo Contrast  Result Date: 08/21/2022 CLINICAL DATA:  Head trauma, minor (Age >= 65y); Neck trauma (Age >= 65y) EXAM: CT HEAD WITHOUT CONTRAST CT CERVICAL SPINE WITHOUT CONTRAST TECHNIQUE: Multidetector CT imaging of the head and cervical spine was performed following the standard protocol without intravenous contrast. Multiplanar CT image reconstructions of the cervical spine were also generated. RADIATION DOSE REDUCTION: This exam was performed according to the departmental dose-optimization program which includes automated  exposure control, adjustment of the mA and/or kV according to patient size and/or use of iterative reconstruction technique. COMPARISON:  CT head 05/28/2022. FINDINGS: CT HEAD FINDINGS Brain:  Acute subdural hematoma along the left anterior falx measuring 3 mm in thickness. Additional acute subdural hemorrhage along the left tentorial leaflet measuring up to 7 mm in thickness. Small volume of adjacent acute subarachnoid hemorrhage along the posterior left temporal convexity. No significant mass effect or midline shift. No evidence of acute large vascular territory infarct, mass lesion or hydrocephalus. Vascular: No hyperdense vessel identified. Skull: No acute fracture. Sinuses/Orbits: Clear sinuses.  No acute orbital findings. Other: No mastoid effusions. CT CERVICAL SPINE FINDINGS Alignment: Reversal the normal cervical lordosis. No substantial sagittal subluxation. No substantial sagittal subluxation. Skull base and vertebrae: Vertebral body heights are maintained. No evidence of acute fracture. Lytic lesion involving the left C6 vertebral body posterior elements. Soft tissues and spinal canal: No prevertebral fluid or swelling. No visible canal hematoma. Disc levels: Multilevel degenerative disease and facet/uncovertebral hypertrophy with varying degrees of neural foraminal stenosis. Upper chest: Visualized lung apices are clear. IMPRESSION: CT head: 1. Acute subdural hemorrhage along the left anterior falx and left tentorial leaflet, measuring up to 7 mm in thickness. 2. Small volume of adjacent acute subarachnoid hemorrhage along the posterior left temporal convexity. 3. No significant mass effect or midline shift. Findings discussed with provider [facility] via telephone at 12:12 p.m. CT cervical spine: 1. No evidence of acute fracture or traumatic malalignment. 2. Lytic lesion involving the left C6 vertebral body posterior elements. This may be a benign lesion such as a vertebral venous malformation; however, a  metastasis could have a similar appearance. If the patient has a known malignancy, recommend MRI of the cervical spine with contrast. Electronically Signed   By: Feliberto Harts M.D.   On: 08/21/2022 12:13   DG Pelvis 1-2 Views  Result Date: 08/21/2022 CLINICAL DATA:  Fall. EXAM: PELVIS - 1-2 VIEW COMPARISON:  April 27, 2022. FINDINGS: There is no evidence of pelvic fracture or diastasis. No pelvic bone lesions are seen. IMPRESSION: Negative. Electronically Signed   By: Lupita Raider M.D.   On: 08/21/2022 11:58   DG Chest 1 View  Result Date: 08/21/2022 CLINICAL DATA:  Fall. EXAM: CHEST  1 VIEW COMPARISON:  May 27, 2022. FINDINGS: Stable cardiomediastinal silhouette. Status post coronary bypass graft. Lungs are clear. Bony thorax is unremarkable. IMPRESSION: No active disease. Electronically Signed   By: Lupita Raider M.D.   On: 08/21/2022 11:57   DG Lumbar Spine Complete  Result Date: 08/21/2022 CLINICAL DATA:  Fall. EXAM: LUMBAR SPINE - COMPLETE 4+ VIEW COMPARISON:  July 28, 2022. FINDINGS: No fracture or spondylolisthesis is noted. Status post surgical posterior fusion of L3, L4 and L5. Interbody fusion is noted at L3-4. Moderate degenerative disc disease is noted at L4-5 and L5-S1. IMPRESSION: Postsurgical and degenerative changes as described above. No acute abnormality seen. Electronically Signed   By: Lupita Raider M.D.   On: 08/21/2022 11:56   DG Tibia/Fibula Left  Result Date: 08/21/2022 CLINICAL DATA:  Fall. EXAM: LEFT TIBIA AND FIBULA - 2 VIEW COMPARISON:  None Available. FINDINGS: There is no evidence of fracture or other focal bone lesions. Soft tissues are unremarkable. IMPRESSION: Negative. Electronically Signed   By: Lupita Raider M.D.   On: 08/21/2022 11:54    Assessment/Plan 1. Type 2 diabetes mellitus with other circulatory complication, with long-term current use of insulin Lab Results  Component Value Date   HGBA1C 8.8 (H) 08/03/2022  - no home CBG for  review - Has not been using her Humalog and Treshiba as prescribed.Was advised in  ED to restart medication. - Requires lancet  - CBC with Differential/Platelet - Complete Metabolic Panel with eGFR - Microalbumin/Creatinine Ratio, Urine - Lancets (UNILET COMFORTOUCH LANCET) MISC; 1 Device by Does not apply route in the morning and at bedtime.  Dispense: 100 each; Refill: 5  2. Chronic diastolic congestive heart failure - No signs of fluid overload  - Keep legs elevated when seated to keep swelling down  - check weight at least three times per week and notify provider for any abrupt weight gain > 3 lbs  - Reduce salt intake in diet  - Continue on Furosemide   3. Cirrhosis of liver with ascites, unspecified hepatic cirrhosis type - Chronic  - continue on Rifaximin ,Furosemide and Lactulose   4. Chronic bilateral low back pain with left-sided sciatica Has upcoming appointment with Orthopedic.Recent CT abdomen in the hospital showed nondisplaced L1, L2 and L3 transverse process fractures. - continue OTC analgesic as needed.   5. Paroxysmal atrial fibrillation HR controlled  Not on anticoagulant due to Fall and [facility]  - off Asprin   6. Hypokalemia K+ 3.4  Will recheck labs - Complete Metabolic Panel with eGFR  7. Open wound of left lower extremity, subsequent encounter Left lateral leg dime size open wound with tunneling draining purulent thick drainage without any odor.surrounding skin without any erythema or tenderness.wound cleansed with saline ,pat dry, packed with < 1" iodoform and covered with foam dressing for extra protection and absorption. - HHN to continue dressing changes.follow up with wound care    8. Mucocele of appendix Afebrile  Negative ABD exam  Noted on recent Abd CT scan in the Hospital  - Has follow up appointment with General surgery   9. Lytic lesion of bone on x-ray - C 6 lytic lesion noted on CT scan during recent Hospitalization  - Follow up with  Neurosurgery as advised on Hospital discharge.    10 .Unsteady gait  S/p hospitalization due to Fall was discharge home with [facility] PT/OT/RN/ST  - fall precaution  Family/ staff Communication: Reviewed plan of care with patient verbalized understanding  Labs/tests ordered: - CBC with Differential/Platelet - Complete Metabolic Panel with eGFR - Microalbumin/Creatinine Ratio, Urine  Next Appointment : Return if symptoms worsen or fail to improve.   Caesar Bookman, NP

## 2022-08-27 ENCOUNTER — Telehealth: Payer: Self-pay | Admitting: Adult Health

## 2022-08-27 LAB — COMPLETE METABOLIC PANEL WITH GFR
AG Ratio: 0.9 (calc) — ABNORMAL LOW (ref 1.0–2.5)
ALT: 36 U/L — ABNORMAL HIGH (ref 6–29)
AST: 50 U/L — ABNORMAL HIGH (ref 10–35)
Albumin: 3 g/dL — ABNORMAL LOW (ref 3.6–5.1)
Alkaline phosphatase (APISO): 97 U/L (ref 37–153)
BUN: 14 mg/dL (ref 7–25)
CO2: 29 mmol/L (ref 20–32)
Calcium: 9.2 mg/dL (ref 8.6–10.4)
Chloride: 98 mmol/L (ref 98–110)
Creat: 0.78 mg/dL (ref 0.60–1.00)
Globulin: 3.4 g/dL (calc) (ref 1.9–3.7)
Glucose, Bld: 581 mg/dL (ref 65–99)
Potassium: 4.1 mmol/L (ref 3.5–5.3)
Sodium: 135 mmol/L (ref 135–146)
Total Bilirubin: 1.3 mg/dL — ABNORMAL HIGH (ref 0.2–1.2)
Total Protein: 6.4 g/dL (ref 6.1–8.1)
eGFR: 79 mL/min/{1.73_m2} (ref >=60)

## 2022-08-27 LAB — MICROALBUMIN / CREATININE URINE RATIO
Creatinine, Urine: 11 mg/dL — ABNORMAL LOW (ref 20–275)
Microalb, Ur: <0.2 mg/dL

## 2022-08-27 NOTE — Telephone Encounter (Signed)
Received called from the lab to report a glucose of 581. Pt was seen in the office 08/26/22.  I called the patient and she stated she was feeling fine with no nausea, vomiting, or weakness. I inquired about Humalog as she was supposed to be starting it but she reported that she had not picked up the prescription yet. I encouraged her to do so right away as this was important for health. She verbalized understanding. She has a f/u apt.

## 2022-08-28 NOTE — Telephone Encounter (Signed)
Noted.Has follow up appointment with Dr.Miller.

## 2022-08-29 DIAGNOSIS — I5033 Acute on chronic diastolic (congestive) heart failure: Secondary | ICD-10-CM | POA: Diagnosis not present

## 2022-08-29 DIAGNOSIS — E1122 Type 2 diabetes mellitus with diabetic chronic kidney disease: Secondary | ICD-10-CM | POA: Diagnosis not present

## 2022-08-29 DIAGNOSIS — L03116 Cellulitis of left lower limb: Secondary | ICD-10-CM | POA: Diagnosis not present

## 2022-08-29 DIAGNOSIS — I130 Hypertensive heart and chronic kidney disease with heart failure and stage 1 through stage 4 chronic kidney disease, or unspecified chronic kidney disease: Secondary | ICD-10-CM | POA: Diagnosis not present

## 2022-08-29 DIAGNOSIS — L89893 Pressure ulcer of other site, stage 3: Secondary | ICD-10-CM | POA: Diagnosis not present

## 2022-08-29 DIAGNOSIS — N1831 Chronic kidney disease, stage 3a: Secondary | ICD-10-CM | POA: Diagnosis not present

## 2022-08-30 ENCOUNTER — Telehealth: Payer: Self-pay

## 2022-08-30 ENCOUNTER — Encounter (HOSPITAL_BASED_OUTPATIENT_CLINIC_OR_DEPARTMENT_OTHER): Payer: Medicare Other | Admitting: Internal Medicine

## 2022-08-30 DIAGNOSIS — Z794 Long term (current) use of insulin: Secondary | ICD-10-CM | POA: Diagnosis not present

## 2022-08-30 DIAGNOSIS — I5033 Acute on chronic diastolic (congestive) heart failure: Secondary | ICD-10-CM | POA: Diagnosis not present

## 2022-08-30 DIAGNOSIS — I130 Hypertensive heart and chronic kidney disease with heart failure and stage 1 through stage 4 chronic kidney disease, or unspecified chronic kidney disease: Secondary | ICD-10-CM | POA: Diagnosis not present

## 2022-08-30 DIAGNOSIS — N1831 Chronic kidney disease, stage 3a: Secondary | ICD-10-CM | POA: Diagnosis not present

## 2022-08-30 DIAGNOSIS — E1122 Type 2 diabetes mellitus with diabetic chronic kidney disease: Secondary | ICD-10-CM | POA: Diagnosis not present

## 2022-08-30 DIAGNOSIS — E1140 Type 2 diabetes mellitus with diabetic neuropathy, unspecified: Secondary | ICD-10-CM | POA: Diagnosis not present

## 2022-08-30 DIAGNOSIS — L97825 Non-pressure chronic ulcer of other part of left lower leg with muscle involvement without evidence of necrosis: Secondary | ICD-10-CM | POA: Diagnosis not present

## 2022-08-30 DIAGNOSIS — E43 Unspecified severe protein-calorie malnutrition: Secondary | ICD-10-CM | POA: Diagnosis not present

## 2022-08-30 DIAGNOSIS — E11622 Type 2 diabetes mellitus with other skin ulcer: Secondary | ICD-10-CM | POA: Diagnosis not present

## 2022-08-30 DIAGNOSIS — K7460 Unspecified cirrhosis of liver: Secondary | ICD-10-CM | POA: Diagnosis not present

## 2022-08-30 DIAGNOSIS — L03116 Cellulitis of left lower limb: Secondary | ICD-10-CM | POA: Diagnosis not present

## 2022-08-30 DIAGNOSIS — L89312 Pressure ulcer of right buttock, stage 2: Secondary | ICD-10-CM | POA: Diagnosis not present

## 2022-08-30 DIAGNOSIS — L89893 Pressure ulcer of other site, stage 3: Secondary | ICD-10-CM | POA: Diagnosis not present

## 2022-08-30 NOTE — Telephone Encounter (Signed)
[name] with [facility] called requesting verbal orders for Home Health speech therapy 1 time a week for 6 weeks for cognitive deficits  Verbal order given.

## 2022-08-30 NOTE — Telephone Encounter (Signed)
[name] with [facility] called requesting verbal orders for PT 1 time a week for 4 weeks. Verbal orders were given.

## 2022-08-31 DIAGNOSIS — N1831 Chronic kidney disease, stage 3a: Secondary | ICD-10-CM | POA: Diagnosis not present

## 2022-08-31 DIAGNOSIS — L03116 Cellulitis of left lower limb: Secondary | ICD-10-CM | POA: Diagnosis not present

## 2022-08-31 DIAGNOSIS — I5033 Acute on chronic diastolic (congestive) heart failure: Secondary | ICD-10-CM | POA: Diagnosis not present

## 2022-08-31 DIAGNOSIS — I130 Hypertensive heart and chronic kidney disease with heart failure and stage 1 through stage 4 chronic kidney disease, or unspecified chronic kidney disease: Secondary | ICD-10-CM | POA: Diagnosis not present

## 2022-08-31 DIAGNOSIS — L89893 Pressure ulcer of other site, stage 3: Secondary | ICD-10-CM | POA: Diagnosis not present

## 2022-08-31 DIAGNOSIS — E1122 Type 2 diabetes mellitus with diabetic chronic kidney disease: Secondary | ICD-10-CM | POA: Diagnosis not present

## 2022-08-31 NOTE — Progress Notes (Signed)
[name], [name] ([id number]) [id number] Page 1 of 9 Visit Report for 08/30/2022 Arrival Information Details Patient Name: Date of Service: [name] 08/30/2022 12:45 PM Medical Record Number: [id number] Patient Account Number: [id number] Date of Birth/Sex: Treating RN: [birth year] (77 y.o. [name], [name] Primary Care [name]: [name] Other Clinician: Referring [name]: Treating [name]/Extender: [name] in Treatment: 2 Visit Information History Since Last Visit Added or deleted any medications: No Patient Arrived: [name] Any new allergies or adverse reactions: No Arrival Time: 12:45 Had a fall or experienced change in No Accompanied By: [name] activities of daily living that may affect Transfer Assistance: None risk of falls: Patient Identification Verified: Yes Signs or symptoms of abuse/neglect since last visito No Secondary Verification Process Completed: Yes Hospitalized since last visit: No Patient Requires Transmission-Based Precautions: No Implantable device outside of the clinic excluding No Patient Has Alerts: No cellular tissue based products placed in the center since last visit: Has Dressing in Place as Prescribed: Yes Pain Present Now: No Electronic Signature(s) Signed: 08/30/2022 5:19:30 PM By: Kayla Stall RN, BSN Entered By: [name] on 08/30/2022 12:46:32 -------------------------------------------------------------------------------- Encounter Discharge Information Details Patient Name: Date of Service: Kayla Kayla C. 08/30/2022 12:45 PM Medical Record Number: [id number] Patient Account Number: [id number] Date of Birth/Sex: Treating RN: [name] (77 y.o. [name], Yvonne Kendall Primary Care [name]: [name] Other Clinician: Referring [name]: Treating [name]/Extender: [name] in Treatment: 2 Encounter Discharge  Information Items Post Procedure Vitals Discharge Condition: Stable Temperature ([name]): 99 Ambulatory Status: Walker Pulse (bpm): 76 Discharge Destination: Home Respiratory Rate (breaths/min): 20 Transportation: Private Auto Blood Pressure (mmHg): 152/68 Accompanied By: [name] Schedule Follow-up Appointment: Yes Clinical Summary of Care: Electronic Signature(s) Signed: 08/30/2022 5:19:30 PM By: Kayla Stall RN, BSN Entered By: [name] on 08/30/2022 13:22:21 -------------------------------------------------------------------------------- Lower Extremity Assessment Details Patient Name: Date of Service: Kayla Kayla C. 08/30/2022 12:45 PM Medical Record Number: [id number] Patient Account Number: [id number] Date of Birth/Sex: Treating RN: [birth year] (77 y.o. Arta Silence Primary Care [name]: [name] Other Clinician: Referring [name]: Treating [name]/Extender: [name] in Treatment: 2 Edema Assessment Assessed: [name]: Yes] [Right: No] V[LeftDENEISHA, [name] ([id number] [Right: [id number] Page 2 of 9] Edema: [Left: N] [Right: o] Calf Left: Right: Point of Measurement: 33.5 cm From Medial Instep 30 cm Ankle Left: Right: Point of Measurement: 9.5 cm From Medial Instep 20 cm Knee To Floor Left: Right: From Medial Instep 44 cm Vascular Assessment Pulses: Dorsalis Pedis Palpable: [Left:Yes] Electronic Signature(s) Signed: 08/30/2022 5:19:30 PM By: Kayla Stall RN, BSN Entered By: [name] on 08/30/2022 12:51:58 -------------------------------------------------------------------------------- Multi Wound Chart Details Patient Name: Date of Service: Kayla Kayla C. 08/30/2022 12:45 PM Medical Record Number: [id number] Patient Account Number: [id number] Date of Birth/Sex: Treating RN: [birth year] (77 y.o. [name]) Primary Care [name]: [name] Other Clinician: Referring  [name]: Treating [name]/Extender: [name] in Treatment: 2 Vital Signs Height(in): 63 Pulse(bpm): 76 Weight(lbs): 120 Blood Pressure(mmHg): 152/68 Body Mass Index(BMI): 21.3 Temperature([name]): 99 Respiratory Rate(breaths/min): 16 [1:Photos:] [[name]:[name]] Left, Posterior Lower Leg Right Gluteus [name] Wound Location: Bump Shear/Friction [name] Wounding Event: Diabetic Wound/Ulcer of the Lower Pressure Ulcer [name] Primary Etiology: Extremity Cellulitis [name] [name] Secondary Etiology: Chronic sinus problems/congestion, Chronic sinus problems/congestion, [name] Comorbid History: Arrhythmia, Congestive Heart Failure, Arrhythmia, Congestive Heart Failure, Coronary Artery Disease, Coronary Artery Disease, Hypertension, Cirrhosis ,  Type II Hypertension, Cirrhosis , Type II Diabetes, Osteoarthritis, Neuropathy, Diabetes, Osteoarthritis, Neuropathy, Confinement Anxiety Confinement Anxiety 05/09/2022 08/07/2021 [name] Date Acquired: 2 2 [name] Weeks of Treatment: Open Open [name] Wound Status: No No [name] Wound Recurrence: No Yes [name] Clustered Wound: 0.8x0.8x1.8 0x0x0 [name] Measurements L x W x D (cm) 0.503 0 [name] A (cm) : rea 0.905 0 [name] Volume (cm) : 57.30% 100.00% [name] % Reduction in AreaMYCHAEL, [name] ([id number]) [id number] Page 3 of 9 23.20% 100.00% [name] % Reduction in Volume: 12 Position 1 (o'clock): 3.4 Maximum Distance 1 (cm): Yes No [name] Tunneling: Grade 2 Category/Stage II [name] Classification: Medium None Present [name] Exudate A mount: Serosanguineous [name] [name] Exudate Type: red, brown [name] [name] Exudate Color: Distinct, outline attached Distinct, outline attached [name] Wound Margin: Large (67-100%) None Present (0%) [name] Granulation A mount: Pink, Pale, Hyper-granulation [name] [name] Granulation Quality: None Present (0%) None Present (0%) [name] Necrotic A mount: Tendon: Yes Fascia: No [name] Exposed Structures: Fascia: No Fat  Layer (Subcutaneous Tissue): No Fat Layer (Subcutaneous Tissue): No Tendon: No Muscle: No Muscle: No Joint: No Joint: No Bone: No Bone: No Medium (34-66%) Large (67-100%) [name] Epithelialization: Debridement - Excisional [name] [name] Debridement: Pre-procedure Verification/Time Out 13:12 [name] [name] Taken: Lidocaine 4% Topical Solution [name] [name] Pain Control: Subcutaneous, Slough [name] [name] Tissue Debrided: Skin/Subcutaneous Tissue [name] [name] Level: 0.5 [name] [name] Debridement A (sq cm): rea Curette [name] [name] Instrument: Minimum [name] [name] Bleeding: Pressure [name] [name] Hemostasis A chieved: 0 [name] [name] Procedural Pain: 0 [name] [name] Post Procedural Pain: Procedure was tolerated well [name] [name] Debridement Treatment Response: 0.8x0.8x1.8 [name] [name] Post Debridement Measurements L x W x D (cm) 0.905 [name] [name] Post Debridement Volume: (cm) Excoriation: No Excoriation: No [name] Periwound Skin Texture: Induration: No Induration: No Callus: No Callus: No Crepitus: No Crepitus: No Rash: No Rash: No Scarring: No Scarring: No Maceration: No Maceration: No [name] Periwound Skin Moisture: Dry/Scaly: No Dry/Scaly: No Atrophie Blanche: No Atrophie Blanche: No [name] Periwound Skin Color: Cyanosis: No Cyanosis: No Ecchymosis: No Ecchymosis: No Erythema: No Erythema: No Hemosiderin Staining: No Hemosiderin Staining: No Mottled: No Mottled: No Pallor: No Pallor: No Rubor: No Rubor: No Debridement [name] [name] Procedures Performed: Treatment Notes Wound #1 (Lower Leg) Wound Laterality: Left, Posterior Cleanser Wound Cleanser Discharge Instruction: Cleanse the wound with wound cleanser prior to applying a clean dressing using gauze sponges, not tissue or cotton balls. Peri-Wound Care Skin Prep Discharge Instruction: Use skin prep as directed Topical Primary Dressing Vashe wet to dry Discharge Instruction: vashe moisten gauze packed into wound tunnel and wound bed. Secondary Dressing Zetuvit Plus  Silicone Border Dressing 4x4 (in/in) Discharge Instruction: Apply silicone border over primary dressing as directed. Secured With Compression Wrap Compression Stockings Add-Ons MORENIKE, [name] ([id number]) [id number] Page 4 of 9 Wound #2 (Gluteus) Wound Laterality: Right Cleanser Peri-Wound Care Topical Primary Dressing Secondary Dressing Secured With Compression Wrap Compression Stockings Add-Ons Electronic Signature(s) Signed: 08/30/2022 3:44:57 PM By: Geralyn Corwin DO Entered By: Geralyn Corwin on 08/30/2022 13:35:22 -------------------------------------------------------------------------------- Multi-Disciplinary Care Plan Details Patient Name: Date of Service: [name]. 08/30/2022 12:45 PM Medical Record Number: [id number] Patient Account Number: [id number] Date of Birth/Sex: Treating RN: [birth year] (77 y.o. Arta Silence Primary Care Aroush Chasse: [name] Other Clinician: Referring Cherita Hebel: Treating Mavin Dyke/Extender: [name] in Treatment: 2 Active Inactive Pain, Acute or Chronic Nursing Diagnoses: Pain, acute or chronic: actual or potential Potential alteration in comfort, pain Goals: Patient will  verbalize adequate pain control and receive pain control interventions during procedures as needed Date Initiated: 08/16/2022 Target Resolution Date: 09/16/2022 Goal Status: Active Patient/caregiver will verbalize comfort level met Date Initiated: 08/16/2022 Target Resolution Date: 09/16/2022 Goal Status: Active Interventions: Encourage patient to take pain medications as prescribed Provide education on pain management Treatment Activities: Administer pain control measures as ordered : 08/16/2022 Notes: Pressure Nursing Diagnoses: Potential for impaired tissue integrity related to pressure, friction, moisture, and shear Goals: Patient will remain free of pressure ulcers Date  Initiated: 08/16/2022 Target Resolution Date: 09/16/2022 Goal Status: Active Patient/caregiver will verbalize understanding of pressure ulcer management Date Initiated: 08/16/2022 Target Resolution Date: 09/15/2022 Goal Status: Active Interventions: SHANYIAH, CONDE ([id number]) [id number].pdf Page 5 of 9 Assess: immobility, friction, shearing, incontinence upon admission and as needed Assess offloading mechanisms upon admission and as needed Provide education on pressure ulcers Notes: Wound/Skin Impairment Nursing Diagnoses: Knowledge deficit related to ulceration/compromised skin integrity Goals: Patient/caregiver will verbalize understanding of skin care regimen Date Initiated: 08/16/2022 Target Resolution Date: 09/15/2022 Goal Status: Active Ulcer/skin breakdown will heal within 14 weeks Date Initiated: 08/16/2022 Target Resolution Date: 11/12/2022 Goal Status: Active Interventions: Assess patient/caregiver ability to perform ulcer/skin care regimen upon admission and as needed Assess ulceration(s) every visit Provide education on ulcer and skin care Treatment Activities: Skin care regimen initiated : 08/16/2022 Topical wound management initiated : 08/16/2022 Notes: Electronic Signature(s) Signed: 08/30/2022 5:19:30 PM By: Kayla Stall RN, BSN Entered By: [name] on 08/30/2022 12:54:31 -------------------------------------------------------------------------------- Pain Assessment Details Patient Name: Date of Service: Kayla Kayla C. 08/30/2022 12:45 PM Medical Record Number: [id number] Patient Account Number: [id number] Date of Birth/Sex: Treating RN: [birth year] (77 y.o. Arta Silence Primary Care Acxel Dingee: [name] Other Clinician: Referring Mirjana Tarleton: Treating Ahlani Wickes/Extender: [name] in Treatment: 2 Active Problems Location of Pain Severity and Description of Pain Patient Has Paino No Site  Locations Pain Management and Medication Current Pain Management: Notes [name], [name] ([id number]) [phone].pdf Page 6 of 9 per patient pain at times when sitting. Electronic Signature(s) Signed: 08/30/2022 5:19:30 PM By: Kayla Stall RN, BSN Entered By: [name] on 08/30/2022 12:47:37 -------------------------------------------------------------------------------- Patient/Caregiver Education Details Patient Name: Date of Service: [name] 4/23/2024andnbsp12:45 PM Medical Record Number: [id number] Patient Account Number: [id number] Date of Birth/Gender: Treating RN: [birth year] (77 y.o. Arta Silence Primary Care Physician: [name] Other Clinician: Referring Physician: Treating Physician/Extender: [name] in Treatment: 2 Education Assessment Education Provided To: Patient Education Topics Provided Wound/Skin Impairment: Handouts: Caring for Your Ulcer Methods: Explain/Verbal Responses: Reinforcements needed Electronic Signature(s) Signed: 08/30/2022 5:19:30 PM By: Kayla Stall RN, BSN Entered By: [name] on 08/30/2022 12:54:41 -------------------------------------------------------------------------------- Wound Assessment Details Patient Name: Date of Service: Kayla Kayla C. 08/30/2022 12:45 PM Medical Record Number: [id number] Patient Account Number: [id number] Date of Birth/Sex: Treating RN: [birth year] (77 y.o. [name], Yvonne Kendall Primary Care Frederick Klinger: [name] Other Clinician: Referring Nashay Brickley: Treating Zofia Peckinpaugh/Extender: [name] in Treatment: 2 Wound Status Wound Number: 1 Primary Diabetic Wound/Ulcer of the Lower Extremity Etiology: Wound Location: Left, Posterior Lower Leg Secondary Cellulitis Wounding Event: Bump Etiology: Date Acquired: 05/09/2022 Wound Open Weeks Of Treatment: 2 Status: Clustered Wound: No Comorbid  Chronic sinus problems/congestion, Arrhythmia, Congestive Heart History: Failure, Coronary Artery Disease, Hypertension, Cirrhosis , Type II Diabetes, Osteoarthritis, Neuropathy, Confinement Anxiety Photos Wound Measurements Length: (cm) 0.8 Stradley, Anamari C ([id number]) Width: (cm) 0.8  Depth: (cm) 1.8 Area: (cm) 0.503 Volume: (cm) 0.905 % Reduction in Area: 57.3% [id number] Page 7 of 9 % Reduction in Volume: 23.2% Epithelialization: Medium (34-66%) Tunneling: Yes Position (o'clock): 12 Maximum Distance: (cm) 3.4 Undermining: No Wound Description Classification: Grade 2 Wound Margin: Distinct, outline attached Exudate Amount: Medium Exudate Type: Serosanguineous Exudate Color: red, brown Foul Odor After Cleansing: No Slough/Fibrino No Wound Bed Granulation Amount: Large (67-100%) Exposed Structure Granulation Quality: Pink, Pale, Hyper-granulation Fascia Exposed: No Necrotic Amount: None Present (0%) Fat Layer (Subcutaneous Tissue) Exposed: No Tendon Exposed: Yes Muscle Exposed: No Joint Exposed: No Bone Exposed: No Periwound Skin Texture Texture Color No Abnormalities Noted: No No Abnormalities Noted: No Callus: No Atrophie Blanche: No Crepitus: No Cyanosis: No Excoriation: No Ecchymosis: No Induration: No Erythema: No Rash: No Hemosiderin Staining: No Scarring: No Mottled: No Pallor: No Moisture Rubor: No No Abnormalities Noted: No Dry / Scaly: No Maceration: No Treatment Notes Wound #1 (Lower Leg) Wound Laterality: Left, Posterior Cleanser Wound Cleanser Discharge Instruction: Cleanse the wound with wound cleanser prior to applying a clean dressing using gauze sponges, not tissue or cotton balls. Peri-Wound Care Skin Prep Discharge Instruction: Use skin prep as directed Topical Primary Dressing Vashe wet to dry Discharge Instruction: vashe moisten gauze packed into wound tunnel and wound bed. Secondary  Dressing Zetuvit Plus Silicone Border Dressing 4x4 (in/in) Discharge Instruction: Apply silicone border over primary dressing as directed. Secured With Compression Wrap Compression Stockings Facilities manager) Signed: 08/30/2022 5:19:30 PM By: Kayla Stall RN, BSN Entered By: [name] on 08/30/2022 12:57:30 Ollen Bowl ([id number]) [id number] Page 8 of 9 -------------------------------------------------------------------------------- Wound Assessment Details Patient Name: Date of Service: [name] 08/30/2022 12:45 PM Medical Record Number: [id number] Patient Account Number: [id number] Date of Birth/Sex: Treating RN: [birth year] (77 y.o. [name], [name] Primary Care Abram Sax: [name] Other Clinician: Referring Amiri Tritch: Treating Allene Furuya/Extender: [name] in Treatment: 2 Wound Status Wound Number: 2 Primary Pressure Ulcer Etiology: Wound Location: Right Gluteus Wound Open Wounding Event: Shear/Friction Status: Date Acquired: 08/07/2021 Comorbid Chronic sinus problems/congestion, Arrhythmia, Congestive Heart Weeks Of Treatment: 2 History: Failure, Coronary Artery Disease, Hypertension, Cirrhosis , Type II Clustered Wound: Yes Diabetes, Osteoarthritis, Neuropathy, Confinement Anxiety Photos Wound Measurements Length: (cm) Width: (cm) Depth: (cm) Area: (cm) Volume: (cm) 0 % Reduction in Area: 100% 0 % Reduction in Volume: 100% 0 Epithelialization: Large (67-100%) 0 Tunneling: No 0 Undermining: No Wound Description Classification: Category/Stage II Wound Margin: Distinct, outline attached Exudate Amount: None Present Foul Odor After Cleansing: No Slough/Fibrino No Wound Bed Granulation Amount: None Present (0%) Exposed Structure Necrotic Amount: None Present (0%) Fascia Exposed: No Fat Layer (Subcutaneous Tissue) Exposed: No Tendon Exposed: No Muscle Exposed:  No Joint Exposed: No Bone Exposed: No Periwound Skin Texture Texture Color No Abnormalities Noted: No No Abnormalities Noted: No Callus: No Atrophie Blanche: No Crepitus: No Cyanosis: No Excoriation: No Ecchymosis: No Induration: No Erythema: No Rash: No Hemosiderin Staining: No Scarring: No Mottled: No Pallor: No Moisture Rubor: No No Abnormalities Noted: No Dry / Scaly: No Maceration: No Electronic Signature(s) Signed: 08/30/2022 5:19:30 PM By: Kayla Stall RN, BSN Entered By: [name] on 08/30/2022 12:50:59 Ollen Bowl ([id number]) [id number] Page 9 of 9 -------------------------------------------------------------------------------- Vitals Details Patient Name: Date of Service: [name] 08/30/2022 12:45 PM Medical Record Number: [id number] Patient Account Number: [id number] Date of Birth/Sex: Treating RN: Conway[birth year] (77 y.o. Arta Silence Primary Care Kenlea Woodell: Hyacinth Meeker ,  Jeannett Senior Other Clinician: Referring Terry Bolotin: Treating Naidelin Gugliotta/Extender: [name] in Treatment: 2 Vital Signs Time Taken: 12:45 Temperature ([name]): 99 Height (in): 63 Pulse (bpm): 76 Weight (lbs): 120 Respiratory Rate (breaths/min): 16 Body Mass Index (BMI): 21.3 Blood Pressure (mmHg): 152/68 Reference Range: 80 - 120 mg / dl Electronic Signature(s) Signed: 08/30/2022 5:19:30 PM By: Kayla Stall RN, BSN Entered By: [name] on 08/30/2022 12:47:23

## 2022-08-31 NOTE — Progress Notes (Signed)
ELSPETH, BLUCHER ([id number]) [id number] Page 1 of 8 Visit Report for 08/30/2022 Chief Complaint Document Details Patient Name: Date of Service: [name] 08/30/2022 12:45 PM Medical Record Number: [id number] Patient Account Number: [id number] Date of Birth/Sex: Treating RN: January 11, 1946 (77 y.o. F) Primary Care Provider: Jacalyn Lefevre Other Clinician: Referring Provider: Treating Provider/Extender: Lanny Hurst in Treatment: 2 Information Obtained from: Patient Chief Complaint 08/16/2022; left posterior leg wound and skin breakdown to the right gluteus Electronic Signature(s) Signed: 08/30/2022 3:44:57 PM By: Geralyn Corwin DO Entered By: Geralyn Corwin on 08/30/2022 13:35:57 -------------------------------------------------------------------------------- Debridement Details Patient Name: Date of Service: [name]. 08/30/2022 12:45 PM Medical Record Number: [id number] Patient Account Number: [id number] Date of Birth/Sex: Treating RN: [birth year] (77 y.o. [name], [name] Primary Care Provider: Jacalyn Lefevre Other Clinician: Referring Provider: Treating Provider/Extender: Lanny Hurst in Treatment: 2 Debridement Performed for Assessment: Wound #1 Left,Posterior Lower Leg Performed By: Physician Geralyn Corwin, DO Debridement Type: Debridement Severity of Tissue Pre Debridement: Fat layer exposed Level of Consciousness (Pre-procedure): Awake and Alert Pre-procedure Verification/Time Out Yes - 13:12 Taken: Start Time: 13:13 Pain Control: Lidocaine 4% T opical Solution Percent of Wound Bed Debrided: 100% T Area Debrided (cm): otal 0.5 Tissue and other material debrided: Viable, Non-Viable, Slough, Subcutaneous, Biofilm, Slough, Hyper-granulation Level: Skin/Subcutaneous Tissue Debridement Description: Excisional Instrument: Curette Bleeding: Minimum Hemostasis  Achieved: Pressure End Time: 13:18 Procedural Pain: 0 Post Procedural Pain: 0 Response to Treatment: Procedure was tolerated well Level of Consciousness (Post- Awake and Alert procedure): Post Debridement Measurements of Total Wound Length: (cm) 0.8 Width: (cm) 0.8 Depth: (cm) 1.8 Volume: (cm) 0.905 Character of Wound/Ulcer Post Debridement: Improved Severity of Tissue Post Debridement: Fat layer exposed Post Procedure Diagnosis Same as Pre-procedure Electronic Signature(s) Signed: 08/30/2022 3:44:57 PM By: Geralyn Corwin DO Signed: 08/30/2022 5:19:30 PM By: Shawn Stall RN, BSN [name] ([id number]) [id number] Page 2 of 8 Entered By: Shawn Stall on 08/30/2022 13:18:42 -------------------------------------------------------------------------------- HPI Details Patient Name: Date of Service: [name] 08/30/2022 12:45 PM Medical Record Number: [id number] Patient Account Number: [id number] Date of Birth/Sex: Treating RN: 02-Jan-1946 (77 y.o. F) Primary Care Provider: Jacalyn Lefevre Other Clinician: Referring Provider: Treating Provider/Extender: Lanny Hurst in Treatment: 2 History of Present Illness HPI Description: 08/16/2022 Ms. [name] is a 77 year old female with a past medical history of cirrhosis of the liver, uncontrolled insulin-dependent type 2 diabetes, severe protein malnutrition that presents the clinic for a left posterior leg wound and skin breakdown to the right buttocks. Daughter is present and helps provide the history. She states that she developed cellulitis to the left leg and subsequently developed a wound. She states this has been present for the past 3 months. She has been treated in the ED for this issue with antibiotics. Currently she is using silver alginate to both wound sites. Daughter states that the patient has been dealing with skin breakdown to the buttocks areas  over the past year. They wax and wane in healing. Patient is mostly sedentary. She currently denies signs of infection. 4/23; Patient presents for follow up. Plan was for silver alginate to the wound beds. The right buttocks has healed. T the left posterior leg there was iodoform o laid on top of the wound bed. Patient has home health and this is what they have been doing. Currently patient denies signs of infection. Electronic Signature(s) Signed:  08/30/2022 3:44:57 PM By: Geralyn Corwin DO Entered By: Geralyn Corwin on 08/30/2022 13:41:21 -------------------------------------------------------------------------------- Physical Exam Details Patient Name: Date of Service: [name] 08/30/2022 12:45 PM Medical Record Number: [id number] Patient Account Number: [id number] Date of Birth/Sex: Treating RN: December 26, 1945 (77 y.o. F) Primary Care Provider: Jacalyn Lefevre Other Clinician: Referring Provider: Treating Provider/Extender: Lanny Hurst in Treatment: 2 Constitutional respirations regular, non-labored and within target range for patient.. Cardiovascular 2+ dorsalis pedis/posterior tibialis pulses. Psychiatric pleasant and cooperative. Notes T the posterior left leg there is a circular wound that tunnels. No signs of surrounding infection including increased warmth, erythema or purulent drainage. T o o the right buttocks there is epithelialization. Electronic Signature(s) Signed: 08/30/2022 3:44:57 PM By: Geralyn Corwin DO Entered By: Geralyn Corwin on 08/30/2022 13:43:24 -------------------------------------------------------------------------------- Physician Orders Details Patient Name: Date of Service: [name]. 08/30/2022 12:45 PM Medical Record Number: [id number] Patient Account Number: [id number] Date of Birth/Sex: Treating RN: May 03, 1946 (76 y.o. 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 - Dr. Mikey Bussing 09/06/2022 130pm room 8 Return A Anesthetic (In clinic) Topical Lidocaine 4% applied to wound bed Bathing/ Shower/ Hygiene May shower with protection but do not get wound dressing(s) wet. Protect dressing(s) with water repellant cover (for example, large plastic bag) or a cast cover and may then take shower. Edema Control - Lymphedema / SCD / Other Elevate legs to the level of the heart or above for 30 minutes daily and/or when sitting for 3-4 times a day throughout the day. Avoid standing for long periods of time. Patient to wear own compression stockings every day. - apply in the morning and remove at night. Exercise regularly Moisturize legs daily. Off-Loading Gel mattress overlay (Group 1) - Medical modalities group one mattress for patient. Home Health New wound care orders this week; continue Home Health for wound care. May utilize formulary equivalent dressing for wound treatment orders unless otherwise specified. - change 2 times a week by home health and all other days daughter to change. vashe wet to dry packing to left leg wound. Other Home Health Orders/Instructions: - [facility] Wound Treatment Wound #1 - Lower Leg Wound Laterality: Left, Posterior Cleanser: Wound Cleanser (Home Health) 1 x Per Day/30 Days Discharge  Instructions: Cleanse the wound with wound cleanser prior to applying a clean dressing using gauze sponges, not tissue or cotton balls. Peri-Wound Care: Skin Prep (Home Health) 1 x Per Day/30 Days Discharge Instructions: Use skin prep as directed Prim Dressing: Vashe wet to dry (Home Health) 1 x Per Day/30 Days ary Discharge Instructions: vashe moisten gauze packed into wound tunnel and wound bed. Secondary Dressing: Zetuvit Plus Silicone Border Dressing 4x4 (in/in) (Home Health) 1 x Per Day/30 Days Discharge Instructions: Apply silicone border over primary dressing as directed. Electronic Signature(s) Signed: 08/30/2022 3:44:57 PM By: Geralyn Corwin DO Entered By: Geralyn Corwin on 08/30/2022 13:43:33 -------------------------------------------------------------------------------- Problem List Details Patient Name: Date of Service: Kayla Bake  A Conway, Kayla BETH C. 08/30/2022 12:45 PM Medical Record Number: [id number] Patient Account Number: [id number] Date of Birth/Sex: Treating RN: 28-Sep-1945 (77 y.o. [name] Primary Care Provider: Jacalyn Lefevre Other Clinician: Referring Provider: Treating Provider/Extender: Lanny Hurst in Treatment: 2 Active Problems ICD-10 Encounter Code Description Active Date MDM Diagnosis L97.825 Non-pressure chronic ulcer of other part of left lower leg with muscle 08/16/2022 No Yes involvement without evidence of necrosis RAESHAWN, TAFOLLA ([id number]) [id number] Page 4 of 8 E11.622 Type 2 diabetes mellitus with other skin ulcer 08/16/2022 No Yes L89.312 Pressure ulcer of right buttock, stage 2 08/16/2022 No Yes K74.60 Unspecified cirrhosis of liver 08/16/2022 No Yes E43 Unspecified severe protein-calorie malnutrition 08/16/2022 No Yes Inactive Problems Resolved Problems Electronic Signature(s) Signed: 08/30/2022 3:44:57 PM By: Geralyn Corwin DO Entered By: Geralyn Corwin on 08/30/2022  13:35:14 -------------------------------------------------------------------------------- Progress Note Details Patient Name: Date of Service: Kayla July C. 08/30/2022 12:45 PM Medical Record Number: [id number] Patient Account Number: [id number] Date of Birth/Sex: Treating RN: 04-Mar-1946 (77 y.o. F) Primary Care Provider: Jacalyn Lefevre Other Clinician: Referring Provider: Treating Provider/Extender: Lanny Hurst in Treatment: 2 Subjective Chief Complaint Information obtained from Patient 08/16/2022; left posterior leg wound and skin breakdown to the right gluteus History of Present Illness (HPI) 08/16/2022 Ms. Concettina Leth is a 77 year old female with a past medical history of cirrhosis of the liver, uncontrolled insulin-dependent type 2 diabetes, severe protein malnutrition that presents the clinic for a left posterior leg wound and skin breakdown to the right buttocks. Daughter is present and helps provide the history. She states that she developed cellulitis to the left leg and subsequently developed a wound. She states this has been present for the past 3 months. She has been treated in the ED for this issue with antibiotics. Currently she is using silver alginate to both wound sites. Daughter states that the patient has been dealing with skin breakdown to the buttocks areas over the past year. They wax and wane in healing. Patient is mostly sedentary. She currently denies signs of infection. 4/23; Patient presents for follow up. Plan was for silver alginate to the wound beds. The right buttocks has healed. T the left posterior leg there was iodoform o laid on top of the wound bed. Patient has home health and this is what they have been doing. Currently patient denies signs of infection. Patient History Information obtained from Patient. Family History Cancer - Father, Heart Disease - Mother, Hypertension - Mother, Lung Disease - Father, No  family history of Diabetes, Hereditary Spherocytosis, Kidney Disease, Seizures, Stroke, Thyroid Problems, Tuberculosis. Social History Never smoker, Marital Status - Married, Alcohol Use - Never, Drug Use - No History, Caffeine Use - Daily. Medical History Ear/Nose/Mouth/Throat Patient has history of Chronic sinus problems/congestion Denies history of Middle ear problems Hematologic/Lymphatic Denies history of Anemia, Hemophilia, Human Immunodeficiency Virus, Lymphedema, Sickle Cell Disease Respiratory Denies history of Aspiration, Asthma, Chronic Obstructive Pulmonary Disease (COPD), Pneumothorax, Sleep Apnea, Tuberculosis Cardiovascular Patient has history of Arrhythmia - Paroxysymal A fib, Congestive Heart Failure, Coronary Artery Disease, Hypertension Denies history of Angina, Deep Vein Thrombosis, Hypotension, Myocardial Infarction, Peripheral Arterial Disease, Peripheral Venous Disease, Phlebitis, [name], [name] ([id number]) [id number] Page 5 of 8 Vasculitis Gastrointestinal Patient has history of Cirrhosis - fatty liver Denies history of Colitis, Crohnoos, Hepatitis A, Hepatitis B, Hepatitis C Endocrine Patient has history of Type II Diabetes Denies history of Type I Diabetes Immunological Denies history  of Lupus Erythematosus, Raynaudoos, Scleroderma Integumentary (Skin) Denies history of History of Burn Musculoskeletal Patient has history of Osteoarthritis Denies history of Gout, Rheumatoid Arthritis, Osteomyelitis Neurologic Patient has history of Neuropathy Denies history of Dementia, Quadriplegia, Paraplegia, Seizure Disorder Oncologic Denies history of Received Chemotherapy, Received Radiation Psychiatric Patient has history of Confinement Anxiety Denies history of Anorexia/bulimia Hospitalization/Surgery History - tips in liver. - open heart surgery. - CVA x 2. Objective Constitutional respirations regular, non-labored and  within target range for patient.. Vitals Time Taken: 12:45 PM, Height: 63 in, Weight: 120 lbs, BMI: 21.3, Temperature: 99 F, Pulse: 76 bpm, Respiratory Rate: 16 breaths/min, Blood Pressure: 152/68 mmHg. Cardiovascular 2+ dorsalis pedis/posterior tibialis pulses. Psychiatric pleasant and cooperative. General Notes: T the posterior left leg there is a circular wound that tunnels. No signs of surrounding infection including increased warmth, erythema or o purulent drainage. T the right buttocks there is epithelialization. o Integumentary (Hair, Skin) Wound #1 status is Open. Original cause of wound was Bump. The date acquired was: 05/09/2022. The wound has been in treatment 2 weeks. The wound is located on the Left,Posterior Lower Leg. The wound measures 0.8cm length x 0.8cm width x 1.8cm depth; 0.503cm^2 area and 0.905cm^3 volume. There is tendon exposed. There is no undermining noted, however, there is tunneling at 12:00 with a maximum distance of 3.4cm. There is a medium amount of serosanguineous drainage noted. The wound margin is distinct with the outline attached to the wound base. There is large (67-100%) pink, pale, hyper - granulation within the wound bed. There is no necrotic tissue within the wound bed. The periwound skin appearance did not exhibit: Callus, Crepitus, Excoriation, Induration, Rash, Scarring, Dry/Scaly, Maceration, Atrophie Blanche, Cyanosis, Ecchymosis, Hemosiderin Staining, Mottled, Pallor, Rubor, Erythema. Wound #2 status is Open. Original cause of wound was Shear/Friction. The date acquired was: 08/07/2021. The wound has been in treatment 2 weeks. The wound is located on the Right Gluteus. The wound measures 0cm length x 0cm width x 0cm depth; 0cm^2 area and 0cm^3 volume. There is no tunneling or undermining noted. There is a none present amount of drainage noted. The wound margin is distinct with the outline attached to the wound base. There is no granulation within the  wound bed. There is no necrotic tissue within the wound bed. The periwound skin appearance did not exhibit: Callus, Crepitus, Excoriation, Induration, Rash, Scarring, Dry/Scaly, Maceration, Atrophie Blanche, Cyanosis, Ecchymosis, Hemosiderin Staining, Mottled, Pallor, Rubor, Erythema. 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 The left posterior leg wound is stable. No signs of surrounding infection. Unfortunately there was iodoform packing placed over the surface which is confusing since this was not a part of the orders. I debrided nonviable tissue. I recommend started packing the wound with Vashe wet-to-dry gauze dressings daily. Follow-up in 1 week. [name], [name] ([id number]) [id number] Page 6 of 8 Procedures Wound #1 Pre-procedure diagnosis of Wound #1 is a Diabetic Wound/Ulcer of the Lower Extremity located on the Left,Posterior Lower Leg .Severity of Tissue Pre Debridement is: Fat layer exposed. There was a Excisional Skin/Subcutaneous Tissue Debridement with a total area of 0.5 sq cm performed by Geralyn Corwin, DO. With the following instrument(s): Curette to remove Viable and Non-Viable tissue/material. Material removed includes Subcutaneous Tissue, Slough, Biofilm,  and Hyper-granulation after achieving pain control using Lidocaine 4% Topical Solution. A time out was conducted at 13:12, prior to the start of the procedure. A Minimum amount of bleeding was controlled with Pressure. The procedure was tolerated well with a pain level of 0 throughout and a pain level of 0 following the procedure. Post Debridement Measurements: 0.8cm length x 0.8cm width  x 1.8cm depth; 0.905cm^3 volume. Character of Wound/Ulcer Post Debridement is improved. Severity of Tissue Post Debridement is: Fat layer exposed. Post procedure Diagnosis Wound #1: Same as Pre-Procedure Plan Follow-up Appointments: Return Appointment in 1 week. - Dr. Mikey Bussing 09/06/2022 130pm room 8 Anesthetic: (In clinic) Topical Lidocaine 4% applied to wound bed Bathing/ Shower/ Hygiene: May shower with protection but do not get wound dressing(s) wet. Protect dressing(s) with water repellant cover (for example, large plastic bag) or a cast cover and may then take shower. Edema Control - Lymphedema / SCD / Other: Elevate legs to the level of the heart or above for 30 minutes daily and/or when sitting for 3-4 times a day throughout the day. Avoid standing for long periods of time. Patient to wear own compression stockings every day. - apply in the morning and remove at night. Exercise regularly Moisturize legs daily. Off-Loading: Gel mattress overlay (Group 1) - Medical modalities group one mattress for patient. Home Health: New wound care orders this week; continue Home Health for wound care. May utilize formulary equivalent dressing for wound treatment orders unless otherwise specified. - change 2 times a week by home health and all other days daughter to change. vashe wet to dry packing to left leg wound. Other Home Health Orders/Instructions: - [facility] WOUND #1: - Lower Leg Wound Laterality: Left, Posterior Cleanser: Wound Cleanser (Home Health) 1 x Per Day/30 Days Discharge Instructions: Cleanse the wound with wound cleanser prior to applying a clean dressing using gauze sponges, not tissue or cotton balls. Peri-Wound Care: Skin Prep (Home Health) 1 x Per Day/30 Days Discharge Instructions: Use skin prep as directed Prim Dressing: Vashe wet to dry (Home Health) 1 x Per Day/30 Days ary Discharge Instructions: vashe moisten gauze packed into wound tunnel and wound  bed. Secondary Dressing: Zetuvit Plus Silicone Border Dressing 4x4 (in/in) (Home Health) 1 x Per Day/30 Days Discharge Instructions: Apply silicone border over primary dressing as directed. 1. Vashe wet-to-dry dressings 2. In office sharp debridement 3. Follow-up in 1 week Electronic Signature(s) Signed: 08/30/2022 3:44:57 PM By: Geralyn Corwin DO Entered By: Geralyn Corwin on 08/30/2022 13:59:50 -------------------------------------------------------------------------------- HxROS Details Patient Name: Date of Service: Kayla July C. 08/30/2022 12:45 PM Medical Record Number: [id number] Patient Account Number: [id number] Date of Birth/Sex: Treating RN: 27-Aug-1945 (77 y.o. F) Primary Care Provider: Jacalyn Lefevre Other Clinician: Referring Provider: Treating Provider/Extender: Lanny Hurst in Treatment: 2 Information Obtained From Patient Ear/Nose/Mouth/Throat Medical History: Positive for: Chronic sinus problems/congestion Negative for: Middle ear problems [name], [name] ([id number]) [id number] Page 7 of 8 Hematologic/Lymphatic Medical History: Negative for: Anemia; Hemophilia; Human Immunodeficiency Virus; Lymphedema; Sickle Cell Disease Respiratory Medical History: Negative for: Aspiration; Asthma; Chronic Obstructive Pulmonary Disease (COPD); Pneumothorax; Sleep Apnea; Tuberculosis Cardiovascular Medical History: Positive for: Arrhythmia - Paroxysymal A fib; Congestive Heart Failure; Coronary Artery Disease; Hypertension Negative for: Angina; Deep Vein Thrombosis; Hypotension; Myocardial Infarction; Peripheral Arterial Disease; Peripheral Venous Disease; Phlebitis; Vasculitis Gastrointestinal Medical History: Positive for: Cirrhosis - fatty liver Negative for: Colitis; Crohns; Hepatitis A; Hepatitis B; Hepatitis C Endocrine Medical History: Positive for: Type  II Diabetes Negative for: Type I  Diabetes Time with diabetes: 10 years Treated with: Insulin Blood sugar tested every day: Yes Tested : once Immunological Medical History: Negative for: Lupus Erythematosus; Raynauds; Scleroderma Integumentary (Skin) Medical History: Negative for: History of Burn Musculoskeletal Medical History: Positive for: Osteoarthritis Negative for: Gout; Rheumatoid Arthritis; Osteomyelitis Neurologic Medical History: Positive for: Neuropathy Negative for: Dementia; Quadriplegia; Paraplegia; Seizure Disorder Oncologic Medical History: Negative for: Received Chemotherapy; Received Radiation Psychiatric Medical History: Positive for: Confinement Anxiety Negative for: Anorexia/bulimia HBO Extended History Items Ear/Nose/Mouth/Throat: Chronic sinus problems/congestion Immunizations Pneumococcal Vaccine: Received Pneumococcal Vaccination: Yes Received Pneumococcal Vaccination On or After 60th Birthday: Yes Implantable Devices None Hospitalization / Surgery History DERRICA, SIEG ([id number]) [id number] Page 8 of 8 Type of Hospitalization/Surgery tips in liver open heart surgery CVA x 2 Family and Social History Cancer: Yes - Father; Diabetes: No; Heart Disease: Yes - Mother; Hereditary Spherocytosis: No; Hypertension: Yes - Mother; Kidney Disease: No; Lung Disease: Yes - Father; Seizures: No; Stroke: No; Thyroid Problems: No; Tuberculosis: No; Never smoker; Marital Status - Married; Alcohol Use: Never; Drug Use: No History; Caffeine Use: Daily; Financial Concerns: No; Food, Clothing or Shelter Needs: No; Support System Lacking: No; Transportation Concerns: No Electronic Signature(s) Signed: 08/30/2022 3:44:57 PM By: Geralyn Corwin DO Entered By: Geralyn Corwin on 08/30/2022 13:41:37 -------------------------------------------------------------------------------- SuperBill Details Patient Name: Date of Service: [name]  08/30/2022 Medical Record Number: [id number] Patient Account Number: [id number] Date of Birth/Sex: Treating RN: November 02, 1945 (77 y.o. 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 without evidence of necrosis E11.622 Type 2 diabetes mellitus with other skin ulcer L89.312 Pressure ulcer of right buttock, stage 2 K74.60 Unspecified cirrhosis of liver E43 Unspecified severe protein-calorie malnutrition Facility Procedures : CPT4 Code: [id number] Description: 11042 - DEB SUBQ TISSUE 20 SQ CM/< ICD-10 Diagnosis Description L97.825 Non-pressure chronic ulcer of other part of left lower leg with muscle involveme Modifier: nt without evidence Quantity: 1 of necrosis Physician Procedures : CPT4 Code Description Modifier [id number] - WC PHYS LEVEL 3 - EST PT 25 ICD-10 Diagnosis Description L89.312 Pressure ulcer of right buttock, stage 2 E11.622 Type 2 diabetes mellitus with other skin ulcer Quantity: 1 : [id number] 11042 - WC PHYS SUBQ TISS 20 SQ CM ICD-10 Diagnosis Description L97.825 Non-pressure chronic ulcer of other part of left lower leg with muscle involvement without evidence o Quantity: 1 f necrosis Electronic Signature(s) Signed: 08/30/2022 3:44:57 PM By: Geralyn Corwin DO Entered By: Geralyn Corwin on 08/30/2022 14:00:19

## 2022-09-01 DIAGNOSIS — I130 Hypertensive heart and chronic kidney disease with heart failure and stage 1 through stage 4 chronic kidney disease, or unspecified chronic kidney disease: Secondary | ICD-10-CM | POA: Diagnosis not present

## 2022-09-01 DIAGNOSIS — N3020 Other chronic cystitis without hematuria: Secondary | ICD-10-CM | POA: Diagnosis not present

## 2022-09-01 DIAGNOSIS — K7460 Unspecified cirrhosis of liver: Secondary | ICD-10-CM | POA: Diagnosis not present

## 2022-09-01 DIAGNOSIS — E538 Deficiency of other specified B group vitamins: Secondary | ICD-10-CM | POA: Diagnosis not present

## 2022-09-01 DIAGNOSIS — I5033 Acute on chronic diastolic (congestive) heart failure: Secondary | ICD-10-CM | POA: Diagnosis not present

## 2022-09-01 DIAGNOSIS — Z794 Long term (current) use of insulin: Secondary | ICD-10-CM | POA: Diagnosis not present

## 2022-09-01 DIAGNOSIS — M810 Age-related osteoporosis without current pathological fracture: Secondary | ICD-10-CM | POA: Diagnosis not present

## 2022-09-01 DIAGNOSIS — E1165 Type 2 diabetes mellitus with hyperglycemia: Secondary | ICD-10-CM | POA: Diagnosis not present

## 2022-09-01 DIAGNOSIS — D509 Iron deficiency anemia, unspecified: Secondary | ICD-10-CM | POA: Diagnosis not present

## 2022-09-01 DIAGNOSIS — E1142 Type 2 diabetes mellitus with diabetic polyneuropathy: Secondary | ICD-10-CM | POA: Diagnosis not present

## 2022-09-01 DIAGNOSIS — K7581 Nonalcoholic steatohepatitis (NASH): Secondary | ICD-10-CM | POA: Diagnosis not present

## 2022-09-01 DIAGNOSIS — L03116 Cellulitis of left lower limb: Secondary | ICD-10-CM | POA: Diagnosis not present

## 2022-09-01 DIAGNOSIS — I480 Paroxysmal atrial fibrillation: Secondary | ICD-10-CM | POA: Diagnosis not present

## 2022-09-01 DIAGNOSIS — I493 Ventricular premature depolarization: Secondary | ICD-10-CM | POA: Diagnosis not present

## 2022-09-01 DIAGNOSIS — Z7984 Long term (current) use of oral hypoglycemic drugs: Secondary | ICD-10-CM | POA: Diagnosis not present

## 2022-09-01 DIAGNOSIS — R42 Dizziness and giddiness: Secondary | ICD-10-CM | POA: Diagnosis not present

## 2022-09-01 DIAGNOSIS — Z8673 Personal history of transient ischemic attack (TIA), and cerebral infarction without residual deficits: Secondary | ICD-10-CM | POA: Diagnosis not present

## 2022-09-01 DIAGNOSIS — F411 Generalized anxiety disorder: Secondary | ICD-10-CM | POA: Diagnosis not present

## 2022-09-01 DIAGNOSIS — E876 Hypokalemia: Secondary | ICD-10-CM | POA: Diagnosis not present

## 2022-09-01 DIAGNOSIS — N1831 Chronic kidney disease, stage 3a: Secondary | ICD-10-CM | POA: Diagnosis not present

## 2022-09-01 DIAGNOSIS — M25552 Pain in left hip: Secondary | ICD-10-CM | POA: Diagnosis not present

## 2022-09-01 DIAGNOSIS — I25118 Atherosclerotic heart disease of native coronary artery with other forms of angina pectoris: Secondary | ICD-10-CM | POA: Diagnosis not present

## 2022-09-01 DIAGNOSIS — E1122 Type 2 diabetes mellitus with diabetic chronic kidney disease: Secondary | ICD-10-CM | POA: Diagnosis not present

## 2022-09-01 DIAGNOSIS — L89893 Pressure ulcer of other site, stage 3: Secondary | ICD-10-CM | POA: Diagnosis not present

## 2022-09-01 DIAGNOSIS — I8500 Esophageal varices without bleeding: Secondary | ICD-10-CM | POA: Diagnosis not present

## 2022-09-02 ENCOUNTER — Emergency Department (HOSPITAL_BASED_OUTPATIENT_CLINIC_OR_DEPARTMENT_OTHER): Payer: Medicare Other

## 2022-09-02 ENCOUNTER — Inpatient Hospital Stay (HOSPITAL_BASED_OUTPATIENT_CLINIC_OR_DEPARTMENT_OTHER)
Admission: EM | Admit: 2022-09-02 | Discharge: 2022-09-09 | DRG: 689 | Disposition: A | Discharge status: Home Health | Payer: Medicare Other | Source: Other Acute Inpatient Hospital | Attending: Internal Medicine | Admitting: Internal Medicine

## 2022-09-02 ENCOUNTER — Other Ambulatory Visit: Payer: Self-pay

## 2022-09-02 ENCOUNTER — Encounter (HOSPITAL_BASED_OUTPATIENT_CLINIC_OR_DEPARTMENT_OTHER): Payer: Self-pay | Admitting: Emergency Medicine

## 2022-09-02 DIAGNOSIS — G9341 Metabolic encephalopathy: Secondary | ICD-10-CM | POA: Diagnosis present

## 2022-09-02 DIAGNOSIS — Z91018 Allergy to other foods: Secondary | ICD-10-CM

## 2022-09-02 DIAGNOSIS — I2510 Atherosclerotic heart disease of native coronary artery without angina pectoris: Secondary | ICD-10-CM | POA: Diagnosis present

## 2022-09-02 DIAGNOSIS — G629 Polyneuropathy, unspecified: Secondary | ICD-10-CM | POA: Diagnosis present

## 2022-09-02 DIAGNOSIS — K7581 Nonalcoholic steatohepatitis (NASH): Secondary | ICD-10-CM | POA: Diagnosis present

## 2022-09-02 DIAGNOSIS — N390 Urinary tract infection, site not specified: Secondary | ICD-10-CM | POA: Diagnosis present

## 2022-09-02 DIAGNOSIS — R4701 Aphasia: Secondary | ICD-10-CM | POA: Diagnosis present

## 2022-09-02 DIAGNOSIS — I5032 Chronic diastolic (congestive) heart failure: Secondary | ICD-10-CM | POA: Diagnosis not present

## 2022-09-02 DIAGNOSIS — Z794 Long term (current) use of insulin: Secondary | ICD-10-CM

## 2022-09-02 DIAGNOSIS — K7460 Unspecified cirrhosis of liver: Secondary | ICD-10-CM | POA: Diagnosis present

## 2022-09-02 DIAGNOSIS — N3020 Other chronic cystitis without hematuria: Principal | ICD-10-CM | POA: Diagnosis present

## 2022-09-02 DIAGNOSIS — R7989 Other specified abnormal findings of blood chemistry: Secondary | ICD-10-CM | POA: Diagnosis present

## 2022-09-02 DIAGNOSIS — E785 Hyperlipidemia, unspecified: Secondary | ICD-10-CM | POA: Diagnosis present

## 2022-09-02 DIAGNOSIS — Z8616 Personal history of COVID-19: Secondary | ICD-10-CM | POA: Diagnosis not present

## 2022-09-02 DIAGNOSIS — F419 Anxiety disorder, unspecified: Secondary | ICD-10-CM | POA: Diagnosis present

## 2022-09-02 DIAGNOSIS — I480 Paroxysmal atrial fibrillation: Secondary | ICD-10-CM | POA: Diagnosis present

## 2022-09-02 DIAGNOSIS — R410 Disorientation, unspecified: Secondary | ICD-10-CM | POA: Diagnosis not present

## 2022-09-02 DIAGNOSIS — E1159 Type 2 diabetes mellitus with other circulatory complications: Secondary | ICD-10-CM | POA: Diagnosis not present

## 2022-09-02 DIAGNOSIS — Z79899 Other long term (current) drug therapy: Secondary | ICD-10-CM | POA: Diagnosis not present

## 2022-09-02 DIAGNOSIS — Z8249 Family history of ischemic heart disease and other diseases of the circulatory system: Secondary | ICD-10-CM | POA: Diagnosis not present

## 2022-09-02 DIAGNOSIS — D696 Thrombocytopenia, unspecified: Secondary | ICD-10-CM | POA: Diagnosis not present

## 2022-09-02 DIAGNOSIS — G9340 Encephalopathy, unspecified: Secondary | ICD-10-CM

## 2022-09-02 DIAGNOSIS — Z91040 Latex allergy status: Secondary | ICD-10-CM

## 2022-09-02 DIAGNOSIS — I252 Old myocardial infarction: Secondary | ICD-10-CM | POA: Diagnosis not present

## 2022-09-02 DIAGNOSIS — K219 Gastro-esophageal reflux disease without esophagitis: Secondary | ICD-10-CM | POA: Diagnosis present

## 2022-09-02 DIAGNOSIS — L89152 Pressure ulcer of sacral region, stage 2: Secondary | ICD-10-CM | POA: Diagnosis present

## 2022-09-02 DIAGNOSIS — S81802D Unspecified open wound, left lower leg, subsequent encounter: Secondary | ICD-10-CM | POA: Diagnosis not present

## 2022-09-02 DIAGNOSIS — Z885 Allergy status to narcotic agent status: Secondary | ICD-10-CM

## 2022-09-02 DIAGNOSIS — I130 Hypertensive heart and chronic kidney disease with heart failure and stage 1 through stage 4 chronic kidney disease, or unspecified chronic kidney disease: Secondary | ICD-10-CM | POA: Diagnosis present

## 2022-09-02 DIAGNOSIS — B961 Klebsiella pneumoniae [K. pneumoniae] as the cause of diseases classified elsewhere: Secondary | ICD-10-CM | POA: Diagnosis present

## 2022-09-02 DIAGNOSIS — E876 Hypokalemia: Secondary | ICD-10-CM | POA: Diagnosis present

## 2022-09-02 DIAGNOSIS — D61818 Other pancytopenia: Secondary | ICD-10-CM | POA: Diagnosis not present

## 2022-09-02 DIAGNOSIS — Z807 Family history of other malignant neoplasms of lymphoid, hematopoietic and related tissues: Secondary | ICD-10-CM

## 2022-09-02 DIAGNOSIS — Z888 Allergy status to other drugs, medicaments and biological substances status: Secondary | ICD-10-CM

## 2022-09-02 DIAGNOSIS — E1122 Type 2 diabetes mellitus with diabetic chronic kidney disease: Secondary | ICD-10-CM | POA: Diagnosis present

## 2022-09-02 DIAGNOSIS — Z8679 Personal history of other diseases of the circulatory system: Secondary | ICD-10-CM

## 2022-09-02 DIAGNOSIS — G8929 Other chronic pain: Secondary | ICD-10-CM | POA: Diagnosis present

## 2022-09-02 DIAGNOSIS — E1165 Type 2 diabetes mellitus with hyperglycemia: Secondary | ICD-10-CM | POA: Diagnosis present

## 2022-09-02 DIAGNOSIS — Z951 Presence of aortocoronary bypass graft: Secondary | ICD-10-CM

## 2022-09-02 DIAGNOSIS — R4182 Altered mental status, unspecified: Secondary | ICD-10-CM | POA: Diagnosis not present

## 2022-09-02 DIAGNOSIS — Z1612 Extended spectrum beta lactamase (ESBL) resistance: Secondary | ICD-10-CM | POA: Diagnosis present

## 2022-09-02 DIAGNOSIS — E119 Type 2 diabetes mellitus without complications: Secondary | ICD-10-CM

## 2022-09-02 DIAGNOSIS — Z974 Presence of external hearing-aid: Secondary | ICD-10-CM

## 2022-09-02 DIAGNOSIS — L8990 Pressure ulcer of unspecified site, unspecified stage: Secondary | ICD-10-CM | POA: Insufficient documentation

## 2022-09-02 DIAGNOSIS — S81802A Unspecified open wound, left lower leg, initial encounter: Secondary | ICD-10-CM | POA: Diagnosis present

## 2022-09-02 DIAGNOSIS — N181 Chronic kidney disease, stage 1: Secondary | ICD-10-CM | POA: Diagnosis present

## 2022-09-02 DIAGNOSIS — Z8261 Family history of arthritis: Secondary | ICD-10-CM

## 2022-09-02 LAB — COMPREHENSIVE METABOLIC PANEL
ALT: 35 U/L (ref 0–44)
AST: 34 U/L (ref 15–41)
Albumin: 3.1 g/dL — ABNORMAL LOW (ref 3.5–5.0)
Alkaline Phosphatase: 92 U/L (ref 38–126)
Anion gap: 9 (ref 5–15)
BUN: 15 mg/dL (ref 8–23)
CO2: 27 mmol/L (ref 22–32)
Calcium: 9.8 mg/dL (ref 8.9–10.3)
Chloride: 98 mmol/L (ref 98–111)
Creatinine, Ser: 0.7 mg/dL (ref 0.44–1.00)
GFR, Estimated: >60 mL/min (ref >60)
Glucose, Bld: 314 mg/dL — ABNORMAL HIGH (ref 70–99)
Potassium: 2.9 mmol/L — ABNORMAL LOW (ref 3.5–5.1)
Sodium: 134 mmol/L — ABNORMAL LOW (ref 135–145)
Total Bilirubin: 2.1 mg/dL — ABNORMAL HIGH (ref 0.3–1.2)
Total Protein: 6.9 g/dL (ref 6.5–8.1)

## 2022-09-02 LAB — URINALYSIS, ROUTINE W REFLEX MICROSCOPIC
Bilirubin Urine: NEGATIVE
Glucose, UA: >1000 mg/dL — AB
Ketones, ur: NEGATIVE mg/dL
Nitrite: POSITIVE — AB
Specific Gravity, Urine: 1.02 (ref 1.005–1.030)
WBC, UA: >50 WBC/hpf (ref 0–5)
pH: 5.5 (ref 5.0–8.0)

## 2022-09-02 LAB — PROTIME-INR
INR: 1.2 (ref 0.8–1.2)
Prothrombin Time: 14.8 seconds (ref 11.4–15.2)

## 2022-09-02 LAB — CBC
HCT: 36.4 % (ref 36.0–46.0)
Hemoglobin: 12.1 g/dL (ref 12.0–15.0)
MCH: 31.3 pg (ref 26.0–34.0)
MCHC: 33.2 g/dL (ref 30.0–36.0)
MCV: 94.1 fL (ref 80.0–100.0)
Platelets: 108 10*3/uL — ABNORMAL LOW (ref 150–400)
RBC: 3.87 MIL/uL (ref 3.87–5.11)
RDW: 15.6 % — ABNORMAL HIGH (ref 11.5–15.5)
WBC: 6.3 10*3/uL (ref 4.0–10.5)
nRBC: 0 % (ref 0.0–0.2)

## 2022-09-02 LAB — AMMONIA: Ammonia: 32 umol/L (ref 9–35)

## 2022-09-02 LAB — TROPONIN I (HIGH SENSITIVITY): Troponin I (High Sensitivity): 32 ng/L — ABNORMAL HIGH (ref <18)

## 2022-09-02 LAB — CBG MONITORING, ED: Glucose-Capillary: 293 mg/dL — ABNORMAL HIGH (ref 70–99)

## 2022-09-02 LAB — MAGNESIUM: Magnesium: 1.7 mg/dL (ref 1.7–2.4)

## 2022-09-02 MED ORDER — PIPERACILLIN-TAZOBACTAM 3.375 G IVPB 30 MIN
3.3750 g | Freq: Once | INTRAVENOUS | Status: AC
  Administered 2022-09-02: 3.375 g via INTRAVENOUS
  Filled 2022-09-02: qty 50

## 2022-09-02 MED ORDER — PIPERACILLIN-TAZOBACTAM 3.375 G IVPB
3.3750 g | Freq: Three times a day (TID) | INTRAVENOUS | Status: DC
  Administered 2022-09-03 – 2022-09-04 (×4): 3.375 g via INTRAVENOUS
  Filled 2022-09-02 (×4): qty 50

## 2022-09-02 MED ORDER — POTASSIUM CHLORIDE 10 MEQ/100ML IV SOLN
10.0000 meq | INTRAVENOUS | Status: AC
  Administered 2022-09-02 – 2022-09-03 (×5): 10 meq via INTRAVENOUS
  Filled 2022-09-02 (×5): qty 100

## 2022-09-02 MED ORDER — SODIUM CHLORIDE 0.9 % IV BOLUS
1000.0000 mL | Freq: Once | INTRAVENOUS | Status: AC
  Administered 2022-09-02: 1000 mL via INTRAVENOUS

## 2022-09-02 NOTE — ED Notes (Addendum)
Pt to CT

## 2022-09-02 NOTE — Progress Notes (Signed)
Pharmacy Antibiotic Note  Makayli Bracken is a 77 y.o. female admitted on 09/02/2022 presenting with confusion, hx of recent UTI with Klebsiella ESBL (zosyn sens).  Pharmacy has been consulted for Zosyn dosing.  Plan: Zosyn 3.375g IV every 8 hours (extended 4h infusion) Monitor renal function, Cx and clinical progression to narrow  Weight: 55.6 kg (122 lb 9.6 oz)  Temp (24hrs), Avg:98.6 F (37 C), Min:98.6 F (37 C), Max:98.6 F (37 C)  Recent Labs  Lab 09/02/22 2124  WBC 6.3  CREATININE 0.70    Estimated Creatinine Clearance: 50.3 mL/min (by C-G formula based on SCr of 0.7 mg/dL).    Allergies  Allergen Reactions   Kiwi Extract Anaphylaxis   Gabapentin Nausea And Vomiting   Tdap [Tetanus-Diphth-Acell Pertussis] Swelling and Other (See Comments)    Swelling at injection site, gets very hot   Latex Itching, Dermatitis and Rash   Statins Other (See Comments)    RHABDOMYOLYSIS   Tramadol Nausea And Vomiting    Daylene Posey, PharmD, [facility] Clinical Pharmacist ED Pharmacist Phone # [phone] 09/02/2022 10:55 PM

## 2022-09-02 NOTE — ED Provider Notes (Signed)
[place] EMERGENCY DEPARTMENT AT [facility] Provider Note   CSN: [id number] Arrival date & time: 09/02/22  2102     History  Chief Complaint  Patient presents with   Altered Mental Status    Velicia Dejager is a 77 y.o. female with a past medical history of CHF, type 2 diabetes, hypertension, hyperlipidemia, paroxysmal A-fib, NASH s/p TIPS procedure presenting today with altered mental status.  Level 5 caveat due to confusion.   Son reports that on Wednesday the patient was not as interactive as usual at discharge.  He says she was "shuffling" around.  Today she stopped speaking to him altogether.  He says that when she has these "episodes" she usually has elevated toxins due to her liver cirrhosis or her urinary tract infection.  She has not been complaining of abdominal pain or any urinary symptoms.   Altered Mental Status      Home Medications Prior to Admission medications   Medication Sig Start Date End Date Taking? Authorizing Provider  acetaminophen (TYLENOL) 500 MG tablet Take 500 mg by mouth every 6 (six) hours as needed for moderate pain.    [provider]  Biotin 40981 MCG TABS Take 10 mg by mouth daily.    [provider]  Calcium Carb-Cholecalciferol (CALCIUM 600 + D PO) Take 1 tablet by mouth in the morning and at bedtime.    [provider]  Cholecalciferol (VITAMIN D-3) 25 MCG (1000 UT) CAPS Take 1,000 Units by mouth daily.    [provider]  Cyanocobalamin (VITAMIN B 12 PO) Take 1,000 mcg by mouth daily.    [provider]  ezetimibe (ZETIA) 10 MG tablet Take 1 tablet (10 mg total) by mouth daily. 02/09/22   Frederica Kuster, MD  fish oil-omega-3 fatty acids 1000 MG capsule Take 1 g by mouth daily. Patient not taking: Reported on 08/22/2022    [provider]  fluticasone (FLONASE) 50 MCG/ACT nasal spray Place 1 spray into both nostrils daily as needed for allergies or rhinitis. Patient  not taking: Reported on 08/22/2022    [provider]  furosemide (LASIX) 20 MG tablet Take 40 mg by mouth daily.    [provider]  glucose blood test strip One Touch Ultra Blue test strips. Use to test blood sugar three times daily. Dx: E11.65 02/09/22   Frederica Kuster, MD  insulin degludec (TRESIBA FLEXTOUCH) 100 UNIT/ML FlexTouch Pen Inject 20 Units into the skin at bedtime. Patient not taking: Reported on 08/22/2022 08/03/22   Sharon Seller, NP  insulin lispro (HUMALOG) 100 UNIT/ML injection Inject 0-0.12 mLs (0-12 Units total) into the skin See admin instructions. Before each meal 3 times a day, 140-199 - 2 units, 200-250 - 4 units, 251-299 - 6 units,  300-349 - 8 units,  350 or above 10 units. 07/04/22   Leroy Sea, MD  Insulin Pen Needle (B-D ULTRAFINE III SHORT PEN) 31G X 8 MM MISC Use to give Insulin injections daily. Dx: E11.65 10/11/21   Frederica Kuster, MD  Insulin Syringe-Needle U-100 (INSULIN SYRINGE 1CC/31GX5/16") 31G X 5/16" 1 ML MISC USE AS DIRECTED TO INJECT LEVIMIR 07/29/20   Frederica Kuster, MD  lactulose [facility]) 10 GM/15ML solution TAKE 30 ML BY MOUTH EVERY DAY 08/23/22   Lynann Bologna, MD  Lancets [facility] LANCET) MISC 1 Device by Does not apply route in the morning and at bedtime. 08/26/22   Ngetich, Dinah C, NP  MAGNESIUM PO Take 400  mg by mouth daily.    [provider]  pantoprazole (PROTONIX) 40 MG tablet TAKE 1 TABLET(40 MG) BY MOUTH DAILY 10/25/21   Frederica Kuster, MD  Polyethyl Glycol-Propyl Glycol (SYSTANE OP) Place 1 drop into both eyes at bedtime.    [provider]  potassium chloride (KLOR-CON M) 10 MEQ tablet Take 20 mEq by mouth 2 (two) times daily.    [provider]  rifaximin (XIFAXAN) 550 MG TABS tablet Take 550 mg by mouth 2 (two) times daily.    [provider]      Allergies    Kiwi extract, Gabapentin, Tdap [tetanus-diphth-acell pertussis], Latex, Statins, and Tramadol     Review of Systems   Review of Systems  Physical Exam Updated Vital Signs BP (!) 145/55   Pulse 79   Temp 98.6 F (37 C) (Oral)   Resp 15   Wt 55.6 kg   LMP  (LMP Unknown)   SpO2 100%   BMI 21.72 kg/m  Physical Exam Vitals and nursing note reviewed.  Constitutional:      Appearance: Normal appearance.  HENT:     Head: Normocephalic and atraumatic.  Eyes:     General: No scleral icterus.    Conjunctiva/sclera: Conjunctivae normal.  Pulmonary:     Effort: Pulmonary effort is normal. No respiratory distress.     Breath sounds: No wheezing.  Abdominal:     General: Abdomen is flat.     Palpations: Abdomen is soft.     Comments: No guarding or tenderness  Skin:    Findings: No rash.  Neurological:     Mental Status: She is alert.     Comments: Difficulty to obtain neuroexam.  Patient intermittently follows exam instructions.  4-5 strength to finger grip bilaterally.  Full range of motion of bilateral upper and lower extremities.  No facial droop or aphasia.  Patient unable to tell me her name but is able to tell me she is in the hospital.  Unable to comply with visual field testing  Psychiatric:        Mood and Affect: Mood normal.     ED Results / Procedures / Treatments   Labs (all labs ordered are listed, but only abnormal results are displayed) Labs Reviewed  CBG MONITORING, ED - Abnormal; Notable for the following components:      Result Value   Glucose-Capillary 293 (*)    All other components within normal limits  COMPREHENSIVE METABOLIC PANEL  CBC  URINALYSIS, ROUTINE W REFLEX MICROSCOPIC  AMMONIA  PROTIME-INR  CBG MONITORING, ED  TROPONIN I (HIGH SENSITIVITY)    EKG EKG Interpretation  Date/Time:  Friday September 02 2022 21:25:40 EDT Ventricular Rate:  83 PR Interval:  140 QRS Duration: 94 QT Interval:  440 QTC Calculation: 517 R Axis:   25 Text Interpretation: Sinus rhythm with Premature atrial complexes Prolonged QT Consider right ventricular  involvement in acute inferior infarct Abnormal ECG When compared with ECG of 21-Aug-2022 12:34, PREVIOUS ECG IS PRESENT Confirmed by Alona Bene [phone]) on 09/02/2022 9:34:05 PM  Radiology No results found.  Procedures Procedures    Medications Ordered in ED Medications - No data to display  ED Course/ Medical Decision Making/ A&P                             Medical Decision Making Amount and/or Complexity of Data Reviewed Labs: ordered. Radiology: ordered.  Risk Prescription drug management. Decision  regarding hospitalization.   77 year old female who presents today due to altered mental status.  Differential includes but is not limited to intracranial hemorrhage, CVA, electrolyte abnormality, UTI, hepatic encephalopathy, ingestion.     This is not an exhaustive differential.    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  2 weeks ago patient had a urine culture positive for Klebsiella UTI   Physical Exam: Pertinent physical exam findings include Patient is alert, only able to tell me where she is, not her name, date of birth, current year or situation  Lab Tests: I ordered, and personally interpreted labs.  The pertinent results include: Urinalysis nitrite and leukocyte positive, greater than 50 WBCs Potassium 2.9 Troponin mildly elevated to 32 which is around patient's baseline   Imaging Studies: Head CT stable   Cardiac Monitoring:  The patient was maintained on a cardiac monitor.  I viewed and interpreted the cardiac monitored which showed an underlying rhythm of: Sinus   Medications: Potassium, Zosyn for UTI and fluids     MDM/Disposition: This is a 77 year old female who presented today with decreased awareness.  Son reports that this happens when her toxins get elevated from her liver disease and with urinary tract infections.  Ammonia negative.  No abdominal pain.  No signs of ascites.  Urinalysis positive for UTI.  2 weeks ago she grew out Klebsiella that was only susceptible to Zosyn and imipenem.  She was given a dose of Zosyn and subsequently will be admitted to the hospital due to encephalopathy secondary to urinary tract infection.  Dr. Rolene Course will be the accepting physician   Final Clinical Impression(s) / ED Diagnoses Final diagnoses:  Encephalopathy  Hypokalemia    Rx / DC Orders ED Discharge Orders     None         Saddie Benders, PA-C 09/02/22 2343    Maia Plan, MD 09/07/22 [phone]

## 2022-09-02 NOTE — ED Triage Notes (Signed)
Pt in with son who states pt has been more confused than normal, hx of frequent UTI's and NASH cirrhosis. Son denies any recent falls since 4/14. Son also reports generalized weakness and increased sleepiness. CBG has been elevated at home, 273 in triage. Pt disoriented x 4

## 2022-09-03 ENCOUNTER — Inpatient Hospital Stay (HOSPITAL_COMMUNITY): Payer: Medicare Other

## 2022-09-03 DIAGNOSIS — E785 Hyperlipidemia, unspecified: Secondary | ICD-10-CM | POA: Diagnosis present

## 2022-09-03 DIAGNOSIS — E1159 Type 2 diabetes mellitus with other circulatory complications: Secondary | ICD-10-CM | POA: Diagnosis not present

## 2022-09-03 DIAGNOSIS — I2510 Atherosclerotic heart disease of native coronary artery without angina pectoris: Secondary | ICD-10-CM | POA: Diagnosis present

## 2022-09-03 DIAGNOSIS — N3020 Other chronic cystitis without hematuria: Secondary | ICD-10-CM | POA: Diagnosis present

## 2022-09-03 DIAGNOSIS — R7989 Other specified abnormal findings of blood chemistry: Secondary | ICD-10-CM | POA: Diagnosis not present

## 2022-09-03 DIAGNOSIS — E1165 Type 2 diabetes mellitus with hyperglycemia: Secondary | ICD-10-CM | POA: Diagnosis present

## 2022-09-03 DIAGNOSIS — Z951 Presence of aortocoronary bypass graft: Secondary | ICD-10-CM | POA: Diagnosis not present

## 2022-09-03 DIAGNOSIS — R4701 Aphasia: Secondary | ICD-10-CM | POA: Diagnosis present

## 2022-09-03 DIAGNOSIS — B961 Klebsiella pneumoniae [K. pneumoniae] as the cause of diseases classified elsewhere: Secondary | ICD-10-CM | POA: Diagnosis present

## 2022-09-03 DIAGNOSIS — G9341 Metabolic encephalopathy: Secondary | ICD-10-CM | POA: Diagnosis present

## 2022-09-03 DIAGNOSIS — D61818 Other pancytopenia: Secondary | ICD-10-CM | POA: Diagnosis not present

## 2022-09-03 DIAGNOSIS — Z8249 Family history of ischemic heart disease and other diseases of the circulatory system: Secondary | ICD-10-CM | POA: Diagnosis not present

## 2022-09-03 DIAGNOSIS — D696 Thrombocytopenia, unspecified: Secondary | ICD-10-CM | POA: Diagnosis not present

## 2022-09-03 DIAGNOSIS — Z8616 Personal history of COVID-19: Secondary | ICD-10-CM | POA: Diagnosis not present

## 2022-09-03 DIAGNOSIS — N390 Urinary tract infection, site not specified: Secondary | ICD-10-CM | POA: Diagnosis not present

## 2022-09-03 DIAGNOSIS — K7460 Unspecified cirrhosis of liver: Secondary | ICD-10-CM | POA: Diagnosis present

## 2022-09-03 DIAGNOSIS — I480 Paroxysmal atrial fibrillation: Secondary | ICD-10-CM | POA: Diagnosis present

## 2022-09-03 DIAGNOSIS — S81802D Unspecified open wound, left lower leg, subsequent encounter: Secondary | ICD-10-CM

## 2022-09-03 DIAGNOSIS — L89152 Pressure ulcer of sacral region, stage 2: Secondary | ICD-10-CM | POA: Diagnosis present

## 2022-09-03 DIAGNOSIS — K7581 Nonalcoholic steatohepatitis (NASH): Secondary | ICD-10-CM | POA: Diagnosis present

## 2022-09-03 DIAGNOSIS — Z79899 Other long term (current) drug therapy: Secondary | ICD-10-CM | POA: Diagnosis not present

## 2022-09-03 DIAGNOSIS — Z794 Long term (current) use of insulin: Secondary | ICD-10-CM | POA: Diagnosis not present

## 2022-09-03 DIAGNOSIS — I130 Hypertensive heart and chronic kidney disease with heart failure and stage 1 through stage 4 chronic kidney disease, or unspecified chronic kidney disease: Secondary | ICD-10-CM | POA: Diagnosis present

## 2022-09-03 DIAGNOSIS — Z1612 Extended spectrum beta lactamase (ESBL) resistance: Secondary | ICD-10-CM | POA: Diagnosis present

## 2022-09-03 DIAGNOSIS — E1122 Type 2 diabetes mellitus with diabetic chronic kidney disease: Secondary | ICD-10-CM | POA: Diagnosis present

## 2022-09-03 DIAGNOSIS — E876 Hypokalemia: Secondary | ICD-10-CM | POA: Diagnosis present

## 2022-09-03 DIAGNOSIS — I5032 Chronic diastolic (congestive) heart failure: Secondary | ICD-10-CM | POA: Diagnosis present

## 2022-09-03 DIAGNOSIS — L8990 Pressure ulcer of unspecified site, unspecified stage: Secondary | ICD-10-CM | POA: Insufficient documentation

## 2022-09-03 DIAGNOSIS — R4182 Altered mental status, unspecified: Secondary | ICD-10-CM | POA: Diagnosis not present

## 2022-09-03 DIAGNOSIS — I252 Old myocardial infarction: Secondary | ICD-10-CM | POA: Diagnosis not present

## 2022-09-03 LAB — TROPONIN I (HIGH SENSITIVITY): Troponin I (High Sensitivity): 33 ng/L — ABNORMAL HIGH (ref <18)

## 2022-09-03 LAB — BASIC METABOLIC PANEL
Anion gap: 7 (ref 5–15)
BUN: 11 mg/dL (ref 8–23)
CO2: 23 mmol/L (ref 22–32)
Calcium: 8.5 mg/dL — ABNORMAL LOW (ref 8.9–10.3)
Chloride: 106 mmol/L (ref 98–111)
Creatinine, Ser: 0.73 mg/dL (ref 0.44–1.00)
GFR, Estimated: >60 mL/min (ref >60)
Glucose, Bld: 257 mg/dL — ABNORMAL HIGH (ref 70–99)
Potassium: 3.1 mmol/L — ABNORMAL LOW (ref 3.5–5.1)
Sodium: 136 mmol/L (ref 135–145)

## 2022-09-03 LAB — GLUCOSE, CAPILLARY
Glucose-Capillary: 373 mg/dL — ABNORMAL HIGH (ref 70–99)
Glucose-Capillary: 318 mg/dL — ABNORMAL HIGH (ref 70–99)

## 2022-09-03 LAB — CBG MONITORING, ED: Glucose-Capillary: 251 mg/dL — ABNORMAL HIGH (ref 70–99)

## 2022-09-03 MED ORDER — LACTULOSE 10 GM/15ML PO SOLN
20.0000 g | Freq: Every day | ORAL | Status: DC
  Administered 2022-09-03 – 2022-09-09 (×7): 20 g via ORAL
  Filled 2022-09-03 (×7): qty 30

## 2022-09-03 MED ORDER — FUROSEMIDE 40 MG PO TABS
40.0000 mg | ORAL_TABLET | Freq: Every day | ORAL | Status: DC
  Administered 2022-09-03 – 2022-09-04 (×2): 40 mg via ORAL
  Filled 2022-09-03 (×2): qty 1

## 2022-09-03 MED ORDER — ACETAMINOPHEN 500 MG PO TABS: 1000.0000 mg | ORAL_TABLET | Freq: Four times a day (QID) | ORAL | Status: DC | PRN

## 2022-09-03 MED ORDER — ACETAMINOPHEN 650 MG RE SUPP: 650.0000 mg | Freq: Four times a day (QID) | RECTAL | Status: DC | PRN

## 2022-09-03 MED ORDER — ACETAMINOPHEN 325 MG PO TABS
650.0000 mg | ORAL_TABLET | Freq: Four times a day (QID) | ORAL | Status: DC | PRN
  Administered 2022-09-09: 650 mg via ORAL
  Filled 2022-09-03: qty 2

## 2022-09-03 MED ORDER — SODIUM CHLORIDE 0.9% FLUSH
3.0000 mL | Freq: Two times a day (BID) | INTRAVENOUS | Status: DC
  Administered 2022-09-03 – 2022-09-09 (×12): 3 mL via INTRAVENOUS

## 2022-09-03 MED ORDER — ONDANSETRON HCL 4 MG/2ML IJ SOLN
4.0000 mg | Freq: Four times a day (QID) | INTRAMUSCULAR | Status: DC | PRN
  Administered 2022-09-07: 4 mg via INTRAVENOUS
  Filled 2022-09-03: qty 2

## 2022-09-03 MED ORDER — ALBUTEROL SULFATE (2.5 MG/3ML) 0.083% IN NEBU: 2.5000 mg | INHALATION_SOLUTION | Freq: Four times a day (QID) | RESPIRATORY_TRACT | Status: DC | PRN

## 2022-09-03 MED ORDER — VITAMIN B-12 1000 MCG PO TABS
1000.0000 ug | ORAL_TABLET | Freq: Every day | ORAL | Status: DC
  Administered 2022-09-03 – 2022-09-09 (×7): 1000 ug via ORAL
  Filled 2022-09-03 (×7): qty 1

## 2022-09-03 MED ORDER — ONDANSETRON HCL 4 MG PO TABS: 4.0000 mg | ORAL_TABLET | Freq: Four times a day (QID) | ORAL | Status: DC | PRN

## 2022-09-03 MED ORDER — CALCIUM GLUCONATE-NACL 1-0.675 GM/50ML-% IV SOLN
1.0000 g | Freq: Once | INTRAVENOUS | Status: AC
  Administered 2022-09-03: 1000 mg via INTRAVENOUS
  Filled 2022-09-03: qty 50

## 2022-09-03 MED ORDER — POTASSIUM CHLORIDE CRYS ER 20 MEQ PO TBCR
40.0000 meq | EXTENDED_RELEASE_TABLET | ORAL | Status: AC
  Administered 2022-09-03: 40 meq via ORAL
  Filled 2022-09-03: qty 2

## 2022-09-03 MED ORDER — PANTOPRAZOLE SODIUM 40 MG PO TBEC
40.0000 mg | DELAYED_RELEASE_TABLET | Freq: Every day | ORAL | Status: DC
  Administered 2022-09-03 – 2022-09-09 (×7): 40 mg via ORAL
  Filled 2022-09-03 (×7): qty 1

## 2022-09-03 MED ORDER — INSULIN DEGLUDEC 100 UNIT/ML ~~LOC~~ SOPN: 20.0000 [IU] | PEN_INJECTOR | Freq: Every day | SUBCUTANEOUS | Status: DC

## 2022-09-03 MED ORDER — POLYETHYL GLYCOL-PROPYL GLYCOL 0.4-0.3 % OP GEL: Freq: Every day | OPHTHALMIC | Status: DC

## 2022-09-03 MED ORDER — INSULIN ASPART 100 UNIT/ML IJ SOLN
0.0000 [IU] | Freq: Three times a day (TID) | INTRAMUSCULAR | Status: DC
  Administered 2022-09-03: 15 [IU] via SUBCUTANEOUS
  Administered 2022-09-04: 11 [IU] via SUBCUTANEOUS
  Administered 2022-09-04: 15 [IU] via SUBCUTANEOUS
  Administered 2022-09-05: 8 [IU] via SUBCUTANEOUS
  Administered 2022-09-05 (×2): 15 [IU] via SUBCUTANEOUS
  Administered 2022-09-06: 5 [IU] via SUBCUTANEOUS
  Administered 2022-09-06 (×2): 11 [IU] via SUBCUTANEOUS
  Administered 2022-09-07: 5 [IU] via SUBCUTANEOUS
  Administered 2022-09-07 – 2022-09-08 (×2): 11 [IU] via SUBCUTANEOUS
  Administered 2022-09-08: 3 [IU] via SUBCUTANEOUS
  Administered 2022-09-08: 8 [IU] via SUBCUTANEOUS
  Administered 2022-09-09: 3 [IU] via SUBCUTANEOUS
  Administered 2022-09-09: 8 [IU] via SUBCUTANEOUS

## 2022-09-03 MED ORDER — ARTIFICIAL TEARS OPHTHALMIC OINT
TOPICAL_OINTMENT | Freq: Every day | OPHTHALMIC | Status: DC
  Filled 2022-09-03: qty 3.5

## 2022-09-03 MED ORDER — NYSTATIN 100000 UNIT/GM EX CREA
1.0000 | TOPICAL_CREAM | Freq: Two times a day (BID) | CUTANEOUS | Status: DC
  Administered 2022-09-03 – 2022-09-09 (×11): 1 via TOPICAL
  Filled 2022-09-03: qty 30

## 2022-09-03 MED ORDER — RIFAXIMIN 550 MG PO TABS
550.0000 mg | ORAL_TABLET | Freq: Two times a day (BID) | ORAL | Status: DC
  Administered 2022-09-03 – 2022-09-09 (×12): 550 mg via ORAL
  Filled 2022-09-03 (×13): qty 1

## 2022-09-03 MED ORDER — INSULIN GLARGINE-YFGN 100 UNIT/ML ~~LOC~~ SOLN
20.0000 [IU] | Freq: Every day | SUBCUTANEOUS | Status: DC
  Administered 2022-09-03 – 2022-09-04 (×2): 20 [IU] via SUBCUTANEOUS
  Filled 2022-09-03 (×3): qty 0.2

## 2022-09-03 NOTE — ED Notes (Signed)
[place] with  cl called for transport

## 2022-09-03 NOTE — Progress Notes (Signed)
Plan of Care Note for accepted transfer   Patient: [name] MRN: [id number]   DOA: 09/02/2022  Facility requesting transfer: [facility] Requesting Provider: Saddie Benders, PA-C  Reason for transfer: Acute metabolic cephalopathy secondary to UTI with history of ESBL Klebsiella pneumoniae. Facility course: [name] is a 77 y.o. female with a past medical history of CHF, type 2 diabetes, hypertension, hyperlipidemia, paroxysmal A-fib, NASH s/p TIPS procedure presenting today with altered mental status.  Level 5 caveat due to confusion.  Son reports that on Wednesday the patient was not as interactive as usual at discharge.  He says she was "shuffling" around.  Today she stopped speaking to him altogether.  He says that when she has these "episodes" she usually has elevated toxins due to her liver cirrhosis or her urinary tract infection.  She has not been complaining of abdominal pain or any urinary symptoms.  When she came to the ER, BP was 133/55 with otherwise normal vital signs.  Labs revealed sodium of 134, potassium of 2.9 and glucose of 314 with total bili of 2.1.  High sensitive troponin was 3233.  CBC showed thrombocytopenia of 108 and was otherwise unremarkable.  INR was 1.2 and PT 14.8.  EKG showed sinus rhythm with a rate of 82 with PACs and Q waves laterally. Noncontrast head CT scan revealed the following: Resolution of previously seen subdural hematoma along the falx.   Persistent subdural hematoma is noted along the left tentorium. The overall density is decreased and the thickness has decreased as well.   No new focal hemorrhage is noted.  The patient was given 3.375 g of IV Zosyn, 10 mill Cabb[place] IV potassium chloride and 1 L bolus of IV normal saline.  Plan of care: The patient is accepted for admission to Med-surg  unit, at [facility]..   The patient will be under the care and supervision of the ER physician until arrival to [facility].  Author: Hannah Beat, MD 09/03/2022  Check [email or web address] for on-call coverage.  Nursing staff, Please call [facility] Admits & Consults System-Wide number on Amion as soon as patient's arrival, so appropriate admitting provider can evaluate the pt.

## 2022-09-03 NOTE — H&P (Addendum)
History and Physical    Patient: [name] WUJ:[id number] DOB: Dec 29, 1945 DOA: 09/02/2022 DOS: the patient was seen and examined on 09/03/2022 PCP: Frederica Kuster, MD  Patient coming from: Transfer from [facility]  Chief Complaint:  Chief Complaint  Patient presents with   Altered Mental Status   HPI: [name] is a 77 y.o. female with medical history significant of  NASH cirrhosis s/p TIPs, diastolic CHF, chronic edema, DM type 2, HLD, anxiety, and lumbar fusion who presents after being noted to be acutely altered.  History is obtained mostly from patient's family present at bedside.  Yesterday patient had been very lethargic and slept most of the day away.  Today she was unable to talk and only was mumbling.  At this time she is doing slightly better, but replies "yeah" to everything.  Records note she had just recently been hospitalized from 4/14-4/16 after having a fall found to have a subdural and subarachnoid hemorrhage with a lytic lesion at the C6 vertebral body posterior elements.  Neurosurgery has been consulted and aspirin has been recommended to be discontinued.  Repeat CT scan was noted to be stable for which patient was cleared for discharge from a neurosurgery standpoint.  Urine culture was positive for ESBL Klebsiella pneumonia.  Patient has been treated with fosfomycin.  Cultures did note only sensitivity to on the Pentam and Zosyn.  In the emergency department patient was noted to have temperature of 100 F mild tachypnea and all other vital signs maintained.  Labs from 4/26 noted WBC 6.3, platelets 108, sodium 134, potassium 2.9, creatinine 0.7, glucose 314,  ammonia 32, and high-sensitivity troponin 32.  Urinalysis noted moderate leukocytes, positive nitrites, many bacteria, and greater than 50 WBCs.  Patient has been given 1 L normal saline IV fluids, potassium chloride 50 mEq IV and started on Zosyn.  Review of Systems: unable to review all systems  due to the inability of the patient to answer questions. Past Medical History:  Diagnosis Date   Chronic cystitis    Chronic diastolic CHF (congestive heart failure) (HCC)    followed by dr Eden Emms   Chronic kidney disease, stage I    Cirrhosis of liver without ascites (HCC) 03/2016   followed by dr Christella Hartigan (GI);   due to fatty liver;   post op cabg 09/ 2017  , post paracentesis for ascites   Coronary artery disease 01/2016   cardiologist--- dr Estrella Myrtle;   01-27-2016  NSTEMI  s/p cardiac cath;   s/p CABG 01/29/16: LIMA-LAD, SVG-RI, SVG-dRCA   DDD (degenerative disc disease), cervical    per pt w/ chronic neck pain   Dyspnea    occasional   Edema of right lower extremity    Esophageal varices (HCC)    followed by dr Christella Hartigan;   hx multiple banding of large varices   Essential hypertension    no meds   GERD (gastroesophageal reflux disease)    Heart murmur     DX FOR YEARS ASYMPTOMATIC   Hepatic encephalopathy (HCC)    History of adenomatous polyp of colon    History of COVID-19 04/2020   per pt mild  symptoms that resolve   History of kidney stones    passed stones   History of non-ST elevation myocardial infarction (NSTEMI) 01/27/2016   Hyperlipidemia    diet controlled   NAFLD (nonalcoholic fatty liver disease)    Neuropathy, peripheral 04/01/2015   OA (osteoarthritis)    Paroxysmal A-fib (HCC)  08/19/19 afib with RVR, spontaneously converted to SR   Portal vein thrombosis    per dr Christella Hartigan  ,  chronic non-occluded   Thrombocytopenia (HCC)    followed by pcp  and hematology-- dr Mosetta Putt;   likely secondary to liver cirrhosis   Type 2 diabetes mellitus treated with insulin (HCC)    Type 2, followed by pcp    (07-21-2021  per pt checks blood sugar dialy in am fasting average 113--200s)   Urge incontinence of urine    Wears hearing aid in both ears    Past Surgical History:  Procedure Laterality Date   CARDIAC CATHETERIZATION N/A 01/27/2016   Procedure: Left Heart Cath and  Coronary Angiography;  Surgeon: Lyn Records, MD;  Location: [facility] INVASIVE CV LAB;  Service: Cardiovascular;  Laterality: N/A;   COLONOSCOPY WITH PROPOFOL N/A 07/07/2016   Procedure: COLONOSCOPY WITH PROPOFOL;  Surgeon: Rachael Fee, MD;  Location: [facility] ENDOSCOPY;  Service: Endoscopy;  Laterality: N/A;   CORONARY ARTERY BYPASS GRAFT N/A 01/28/2016   Procedure: CORONARY ARTERY BYPASS GRAFTING (CABG) x 3 USING RIGHT LEG GREATER SAPHENOUS VEIN GRAFT;  Surgeon: Loreli Slot, MD;  Location: [facility] OR;  Service: Open Heart Surgery;  Laterality: N/A;   CYSTOSCOPY WITH RETROGRADE PYELOGRAM, URETEROSCOPY AND STENT PLACEMENT Left 05/30/2021   Procedure: CYSTOSCOPY WITH RETROGRADE PYELOGRAM, URETEROSCOPY AND STENT PLACEMENT;  Surgeon: Rene Paci, MD;  Location: [facility] OR;  Service: Urology;  Laterality: Left;   CYSTOSCOPY/URETEROSCOPY/HOLMIUM LASER/STENT PLACEMENT Left 07/26/2021   Procedure: CYSTOSCOPY/RETROGRADE/URETEROSCOPY/HOLMIUM LASER/STENT EXCHANGE;  Surgeon: Jannifer Hick, MD;  Location: [facility];  Service: Urology;  Laterality: Left;   ENDOVEIN HARVEST OF GREATER SAPHENOUS VEIN Right 01/28/2016   Procedure: ENDOVEIN HARVEST OF GREATER SAPHENOUS VEIN;  Surgeon: Loreli Slot, MD;  Location: [facility] OR;  Service: Open Heart Surgery;  Laterality: Right;   ESOPHAGEAL BANDING  03/28/2019   Procedure: ESOPHAGEAL BANDING;  Surgeon: Rachael Fee, MD;  Location: [facility] ENDOSCOPY;  Service: Endoscopy;;   ESOPHAGEAL BANDING  04/07/2019   Procedure: ESOPHAGEAL BANDING;  Surgeon: Charna Lyndell, MD;  Location: [facility] ENDOSCOPY;  Service: Endoscopy;;   ESOPHAGOGASTRODUODENOSCOPY N/A 04/07/2019   Procedure: ESOPHAGOGASTRODUODENOSCOPY (EGD);  Surgeon: Charna Alexandria, MD;  Location: [facility] ENDOSCOPY;  Service: Endoscopy;  Laterality: N/A;   ESOPHAGOGASTRODUODENOSCOPY (EGD) WITH PROPOFOL N/A 07/07/2016   Procedure: ESOPHAGOGASTRODUODENOSCOPY (EGD) WITH PROPOFOL;  Surgeon: Rachael Fee, MD;   Location: [facility] ENDOSCOPY;  Service: Endoscopy;  Laterality: N/A;   ESOPHAGOGASTRODUODENOSCOPY (EGD) WITH PROPOFOL N/A 03/28/2019   Procedure: ESOPHAGOGASTRODUODENOSCOPY (EGD) WITH PROPOFOL;  Surgeon: Rachael Fee, MD;  Location: [facility] ENDOSCOPY;  Service: Endoscopy;  Laterality: N/A;   EXTRACORPOREAL SHOCK WAVE LITHOTRIPSY  2010   HEMOSTASIS CLIP PLACEMENT  04/07/2019   Procedure: HEMOSTASIS CLIP PLACEMENT;  Surgeon: Charna Carli, MD;  Location: [facility] ENDOSCOPY;  Service: Endoscopy;;   IR ANGIOGRAM SELECTIVE EACH ADDITIONAL VESSEL  04/08/2019   IR EMBO ART  VEN HEMORR LYMPH EXTRAV  INC GUIDE ROADMAPPING  04/08/2019   IR PARACENTESIS  04/08/2019   IR RADIOLOGIST EVAL & MGMT  06/13/2019   IR RADIOLOGIST EVAL & MGMT  09/25/2019   IR RADIOLOGIST EVAL & MGMT  07/07/2020   IR RADIOLOGIST EVAL & MGMT  07/30/2021   IR TIPS  04/08/2019   LAMINECTOMY WITH POSTERIOR LATERAL ARTHRODESIS LEVEL 1 Bilateral 02/25/2022   Procedure: Laminectomy and Foraminotomy - Lumbar four-Lumbar five - bilateral, posterolateral arthrodesis Lumbar four-five, extension of hardware Lumbar four-five;  Surgeon: Tia Alert, MD;  Location:  [facility] OR;  Service: Neurosurgery;  Laterality: Bilateral;   MAXIMUM ACCESS (MAS)POSTERIOR LUMBAR INTERBODY FUSION (PLIF) 1 LEVEL Left 06/10/2015   Procedure: FOR MAXIMUM ACCESS (MAS) POSTERIOR LUMBAR INTERBODY FUSION (PLIF) LUMBAR THREE-FOUR EXTRAFORAMINAL MICRODISCECTOMY LUMBAR FIVE-SACRAL ONE LEFT;  Surgeon: Tia Alert, MD;  Location: [facility] NEURO ORS;  Service: Neurosurgery;  Laterality: Left;   RADIOLOGY WITH ANESTHESIA N/A 04/08/2019   Procedure: RADIOLOGY WITH ANESTHESIA;  Surgeon: Radiologist, Medication, MD;  Location: [facility] OR;  Service: Radiology;  Laterality: N/A;   SCLEROTHERAPY  04/07/2019   Procedure: SCLEROTHERAPY;  Surgeon: Charna Hedda, MD;  Location: [facility] ENDOSCOPY;  Service: Endoscopy;;   TEE WITHOUT CARDIOVERSION N/A 01/28/2016   Procedure: TRANSESOPHAGEAL ECHOCARDIOGRAM (TEE);   Surgeon: Loreli Slot, MD;  Location: [facility] OR;  Service: Open Heart Surgery;  Laterality: N/A;   TUBAL LIGATION  1982   Dr Ninetta Lights HERNIA REPAIR N/A 09/10/2019   Procedure: HERNIA REPAIR UMBILICAL ADULT;  Surgeon: Manus Rudd, MD;  Location: [facility] OR;  Service: General;  Laterality: N/A;   VAGINAL HYSTERECTOMY  1997   Dr Vivien Rota   Social History:  reports that she has never smoked. She has never used smokeless tobacco. She reports that she does not drink alcohol and does not use drugs.  Allergies  Allergen Reactions   Kiwi Extract Anaphylaxis   Gabapentin Nausea And Vomiting   Tdap [Tetanus-Diphth-Acell Pertussis] Swelling and Other (See Comments)    Swelling at injection site, gets very hot   Latex Itching, Dermatitis and Rash   Statins Other (See Comments)    RHABDOMYOLYSIS   Tramadol Nausea And Vomiting    Family History  Problem Relation Age of Onset   Heart disease Mother    Breast cancer Mother    Lung cancer Father    Arthritis Sister    Arthritis Brother    Liver cancer Brother    Lymphoma Brother    Heart disease Maternal Grandmother    Heart disease Maternal Grandfather    Heart disease Paternal Grandmother    Heart disease Paternal Grandfather    Other Daughter        house fire   Breast cancer Maternal Aunt    Breast cancer Paternal Aunt    Colon cancer Neg Hx    Esophageal cancer Neg Hx    Rectal cancer Neg Hx    Stomach cancer Neg Hx     Prior to Admission medications   Medication Sig Start Date End Date Taking? Authorizing Provider  acetaminophen (TYLENOL) 500 MG tablet Take 500 mg by mouth every 6 (six) hours as needed for moderate pain.    [provider]  Biotin 16109 MCG TABS Take 10 mg by mouth daily.    [provider]  Calcium Carb-Cholecalciferol (CALCIUM 600 + D PO) Take 1 tablet by mouth in the morning and at bedtime.    [provider]  Cholecalciferol (VITAMIN D-3) 25 MCG (1000 UT) CAPS Take 1,000  Units by mouth daily.    [provider]  Cyanocobalamin (VITAMIN B 12 PO) Take 1,000 mcg by mouth daily.    [provider]  ezetimibe (ZETIA) 10 MG tablet Take 1 tablet (10 mg total) by mouth daily. 02/09/22   Frederica Kuster, MD  fish oil-omega-3 fatty acids 1000 MG capsule Take 1 g by mouth daily. Patient not taking: Reported on 08/22/2022    [provider]  fluticasone (FLONASE) 50 MCG/ACT nasal spray Place 1 spray into both nostrils daily as needed  for allergies or rhinitis. Patient not taking: Reported on 08/22/2022    [provider]  furosemide (LASIX) 20 MG tablet Take 40 mg by mouth daily.    [provider]  glucose blood test strip One Touch Ultra Blue test strips. Use to test blood sugar three times daily. Dx: E11.65 02/09/22   Frederica Kuster, MD  insulin degludec (TRESIBA FLEXTOUCH) 100 UNIT/ML FlexTouch Pen Inject 20 Units into the skin at bedtime. Patient not taking: Reported on 08/22/2022 08/03/22   Sharon Seller, NP  insulin lispro (HUMALOG) 100 UNIT/ML injection Inject 0-0.12 mLs (0-12 Units total) into the skin See admin instructions. Before each meal 3 times a day, 140-199 - 2 units, 200-250 - 4 units, 251-299 - 6 units,  300-349 - 8 units,  350 or above 10 units. 07/04/22   Leroy Sea, MD  Insulin Pen Needle (B-D ULTRAFINE III SHORT PEN) 31G X 8 MM MISC Use to give Insulin injections daily. Dx: E11.65 10/11/21   Frederica Kuster, MD  Insulin Syringe-Needle U-100 (INSULIN SYRINGE 1CC/31GX5/16") 31G X 5/16" 1 ML MISC USE AS DIRECTED TO INJECT LEVIMIR 07/29/20   Frederica Kuster, MD  lactulose [facility]) 10 GM/15ML solution TAKE 30 ML BY MOUTH EVERY DAY 08/23/22   Lynann Bologna, MD  Lancets [facility] LANCET) MISC 1 Device by Does not apply route in the morning and at bedtime. 08/26/22   Ngetich, Dinah C, NP  MAGNESIUM PO Take 400 mg by mouth daily.    [provider]  pantoprazole (PROTONIX) 40 MG tablet  TAKE 1 TABLET(40 MG) BY MOUTH DAILY 10/25/21   Frederica Kuster, MD  Polyethyl Glycol-Propyl Glycol (SYSTANE OP) Place 1 drop into both eyes at bedtime.    [provider]  potassium chloride (KLOR-CON M) 10 MEQ tablet Take 20 mEq by mouth 2 (two) times daily.    [provider]  rifaximin (XIFAXAN) 550 MG TABS tablet Take 550 mg by mouth 2 (two) times daily.    [provider]    Physical Exam: Vitals:   09/03/22 0900 09/03/22 0930 09/03/22 1000 09/03/22 1118  BP: (!) 128/59 125/62 118/61 121/76  Pulse: 73 66 66 74  Resp:    16  Temp:    98.6 F (37 C)  TempSrc:    Oral  SpO2: 97% 96% 96% 99%  Weight:       Constitutional: Elderly who appears to be ill but in no acute distress Eyes: PERRL, sclera icterus present ENMT: Mucous membranes are dry. Neck: normal, supple.  Subtle signs of JVD. Respiratory: clear to auscultation bilaterally, no wheezing, no crackles. Normal respiratory effort.   Cardiovascular: Regular rate and rhythm, no murmurs / rubs / gallops. No extremity edema. 2+ pedal pulses. No carotid bruits.  Abdomen: no tenderness, no masses palpated.  Bowel sounds positive.  Musculoskeletal: no clubbing / cyanosis. No joint deformity upper and lower extremities. Good ROM, no contractures. Normal muscle tone.  Skin: Wound present of the posterior aspect of the left leg with no significant surrounding erythema Neurologic: CN 2-12 grossly intact.   Strength 5/5 in all 4.  Psychiatric: Alert, but unable to assess for orientation only states yeah at this time  Data Reviewed:  EKG reveals sinus rhythm at 82 bpm with premature atrial complexes.  Reviewed labs, imaging, and pertinent records as noted above in HPI.  Assessment and Plan:  Suspected ESBL Klebsiella pneumonia UTI Prior to arrival.  During last hospitalization patient has been treated  for a UTI with fosfomycin.  Jardiance had been discontinued during last [place] due to the UTI.  Cultures  grew out ESBL Klebsiella which was only sensitive to imipenem and Zosyn.  Patient has been started on empiric antibiotics of Zosyn. -Admit to a MedSurg bed -Check urine cultures -Continue Zosyn.  Adjust antibiotics as needed.  Acute metabolic encephalopathy Patient was noted to be acutely altered with reports of aphasia.  Ammonia level was noted to be within normal limits at 32.  Family notes similar presentation previously in the past with infections.  CT scan of the head noted resolution of previously seen subdural hematoma along the falx with persistent subdural hematoma noted along the left tentorium with overall decreased density in thickness. -Neurochecks -Consider needed further workup of symptoms do not appear to be improving.  Elevated troponin High-sensitivity troponins were noted to be 32->33.    Patient had not been reported to have any complaints of chest pain.  Hypokalemia Hypocalcemia Acute.  Initial potassium noted to be 2.8.  Patient has been given potassium chloride 50 mEq IV.  Repeat potassium was noted to be 3.1.  Repeat labs noted calcium low at 8.5. -Give potassium chloride 40 meq po -Give calcium gluconate 1 g IV -Continue to monitor and replace as needed  NASH cirrhosis s/p TIPS -Continuerifaximin, furosemide, and lactulose   Chronic diastolic congestive heart failure Patient does not appear grossly fluid overloaded at this time.  Patient's last echocardiogram noted LVEF of 60 - 65%, G2 DD and normal RVSP.  -Strict I&O's and daily weights -Continue furosemide  History of intracranial bleed Patient with prior SDH and SAH after fall recently seen in hospital on 4/14.  CT scan of the head noted stable and decreasing leads and brain  Uncontrolled diabetes mellitus type 2, with long-term use of insulin -Hypoglycemic protocols -CBGs before every meal with moderate SSI -Continue previously recommended Tresiba 20 units nightly  Wound of left leg Patient has a  wound on the left leg. -Continue current wound care  Thrombocytopenia Chronic.  Appears similar to prior.  Secondary to patient's known liver cirrhosis. 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 -Patient had been recommended follow-up with general surgery in outpatient setting during previous hospitalization   DVT prophylaxis: SCDs Advance Care Planning:   Code Status: Full Code   Consults: none  Family Communication: Family updated at bedside  Severity of Illness: The appropriate patient status for this patient is INPATIENT. Inpatient status is judged to be reasonable and necessary in order to provide the required intensity of service to ensure the patient's safety. The patient's presenting symptoms, physical exam findings, and initial radiographic and laboratory data in the context of their chronic comorbidities is felt to place them at high risk for further clinical deterioration. Furthermore, it is not anticipated that the patient will be medically stable for discharge from the hospital within 2 midnights of admission.   * I certify that at the point of admission it is my clinical judgment that the patient will require inpatient hospital care spanning beyond 2 midnights from the point of admission due to high intensity of service, high risk for further deterioration and high frequency of surveillance required.*  Author: Clydie Braun, MD 09/03/2022 11:49 AM  For on call review [email or web address].

## 2022-09-03 NOTE — Plan of Care (Signed)
  Problem: Education: Goal: Knowledge of General Education information will improve Description: Including pain rating scale, medication(s)/side effects and non-pharmacologic comfort measures Outcome: Progressing   Problem: Health Behavior/Discharge Planning: Goal: Ability to manage health-related needs will improve Outcome: Progressing   Problem: Activity: Goal: Risk for activity intolerance will decrease Outcome: Progressing   

## 2022-09-04 DIAGNOSIS — E876 Hypokalemia: Secondary | ICD-10-CM | POA: Diagnosis not present

## 2022-09-04 DIAGNOSIS — N390 Urinary tract infection, site not specified: Secondary | ICD-10-CM | POA: Diagnosis not present

## 2022-09-04 DIAGNOSIS — G9341 Metabolic encephalopathy: Secondary | ICD-10-CM | POA: Diagnosis not present

## 2022-09-04 DIAGNOSIS — K7581 Nonalcoholic steatohepatitis (NASH): Secondary | ICD-10-CM | POA: Diagnosis not present

## 2022-09-04 LAB — COMPREHENSIVE METABOLIC PANEL
ALT: 30 U/L (ref 0–44)
AST: 37 U/L (ref 15–41)
Albumin: 2.1 g/dL — ABNORMAL LOW (ref 3.5–5.0)
Alkaline Phosphatase: 79 U/L (ref 38–126)
Anion gap: 8 (ref 5–15)
BUN: 15 mg/dL (ref 8–23)
CO2: 22 mmol/L (ref 22–32)
Calcium: 8.9 mg/dL (ref 8.9–10.3)
Chloride: 105 mmol/L (ref 98–111)
Creatinine, Ser: 0.84 mg/dL (ref 0.44–1.00)
GFR, Estimated: >60 mL/min (ref >60)
Glucose, Bld: 319 mg/dL — ABNORMAL HIGH (ref 70–99)
Potassium: 3 mmol/L — ABNORMAL LOW (ref 3.5–5.1)
Sodium: 135 mmol/L (ref 135–145)
Total Bilirubin: 1.7 mg/dL — ABNORMAL HIGH (ref 0.3–1.2)
Total Protein: 5.6 g/dL — ABNORMAL LOW (ref 6.5–8.1)

## 2022-09-04 LAB — CBC
HCT: 33.1 % — ABNORMAL LOW (ref 36.0–46.0)
Hemoglobin: 10.9 g/dL — ABNORMAL LOW (ref 12.0–15.0)
MCH: 31.3 pg (ref 26.0–34.0)
MCHC: 32.9 g/dL (ref 30.0–36.0)
MCV: 95.1 fL (ref 80.0–100.0)
Platelets: 100 10*3/uL — ABNORMAL LOW (ref 150–400)
RBC: 3.48 MIL/uL — ABNORMAL LOW (ref 3.87–5.11)
RDW: 15.4 % (ref 11.5–15.5)
WBC: 4 10*3/uL (ref 4.0–10.5)
nRBC: 0 % (ref 0.0–0.2)

## 2022-09-04 LAB — GLUCOSE, CAPILLARY
Glucose-Capillary: 244 mg/dL — ABNORMAL HIGH (ref 70–99)
Glucose-Capillary: 375 mg/dL — ABNORMAL HIGH (ref 70–99)
Glucose-Capillary: 468 mg/dL — ABNORMAL HIGH (ref 70–99)
Glucose-Capillary: 335 mg/dL — ABNORMAL HIGH (ref 70–99)

## 2022-09-04 LAB — CBG MONITORING, ED: Glucose-Capillary: 218 mg/dL — ABNORMAL HIGH (ref 70–99)

## 2022-09-04 LAB — GLUCOSE, RANDOM: Glucose, Bld: 316 mg/dL — ABNORMAL HIGH (ref 70–99)

## 2022-09-04 LAB — URINE CULTURE

## 2022-09-04 MED ORDER — INSULIN ASPART 100 UNIT/ML IJ SOLN
15.0000 [IU] | Freq: Once | INTRAMUSCULAR | Status: AC
  Administered 2022-09-04: 15 [IU] via SUBCUTANEOUS

## 2022-09-04 MED ORDER — MAGNESIUM SULFATE 4 GM/100ML IV SOLN
4.0000 g | Freq: Once | INTRAVENOUS | Status: AC
  Administered 2022-09-04: 4 g via INTRAVENOUS
  Filled 2022-09-04: qty 100

## 2022-09-04 MED ORDER — POTASSIUM CHLORIDE CRYS ER 20 MEQ PO TBCR
40.0000 meq | EXTENDED_RELEASE_TABLET | ORAL | Status: AC
  Administered 2022-09-04 (×2): 40 meq via ORAL
  Filled 2022-09-04 (×2): qty 2

## 2022-09-04 MED ORDER — SODIUM CHLORIDE 0.9 % IV SOLN
1.0000 g | Freq: Two times a day (BID) | INTRAVENOUS | Status: DC
  Administered 2022-09-04 – 2022-09-05 (×4): 1 g via INTRAVENOUS
  Filled 2022-09-04 (×4): qty 20

## 2022-09-04 NOTE — Progress Notes (Signed)
Md notified of blood glucose 468. New orders placed

## 2022-09-04 NOTE — Assessment & Plan Note (Signed)
2023 echocardiogram with preserved LV systolic function, EF 60 to 65%. No LVH, RV systolic function preserved. RVSP 30,5 mmHg. La with mild to moderate dilatation. Mild MR and mild MR stenosis. Mild TR   Patient euvolemic.  Resume diuretic therapy with furosemide 40 mg po daily.  Continue blood pressure monitoring.

## 2022-09-04 NOTE — Assessment & Plan Note (Signed)
Multifactorial metabolic encephalopathy present on admission. Today has resolved.  Plan to continue supportive medical therapy, including antibiotic therapy for urine infection,   Head CT with no recurrent or new bleeding.  Continue Pt and Ot.

## 2022-09-04 NOTE — Assessment & Plan Note (Signed)
>>  ASSESSMENT AND PLAN FOR DM2 (DIABETES MELLITUS, TYPE 2) (HCC) WRITTEN ON 09/06/2022  6:57 PM BY THOMPSON, DANIEL V, MD  Uncontrolled hyperglycemia.  -Hemoglobin A1c 8.8 (08/03/2022) -Increase Semglee to 25 units twice daily. -SSI.

## 2022-09-04 NOTE — Assessment & Plan Note (Signed)
Wbc is 4,0. Patient has been afebrile.   Urine cultures from last hospitalization positive for Klebsiella Pneumonia ESBL.   Plan to transition antibiotic therapy to Imipenem.  Continue to follow up cell count and temperature curve.  Follow up on cultures.  Add contact precautions.

## 2022-09-04 NOTE — Assessment & Plan Note (Signed)
Uncontrolled hyperglycemia.  -Hemoglobin A1c 8.8 (08/03/2022) -Increase Semglee to 25 units twice daily. -SSI.

## 2022-09-04 NOTE — Assessment & Plan Note (Signed)
Chronic anemia and thrombocytopenia. Cell count stable with hgb at 10,9 and plt at 100.  Close follow up.

## 2022-09-04 NOTE — Progress Notes (Signed)
Progress Note   Patient: [name] WUJ:[id number] DOB: 08/17/1945 DOA: 09/02/2022     1 DOS: the patient was seen and examined on 09/04/2022   Brief hospital course: Mrs, Dsouza was admitted to the hospital with the working diagnosis of acute metabolic encephalopathy due to urinary tract infection.   77 yo female with the past medical history of NASH cirrhosis sp TIPs, diastolic heart failure, chronic anemia, T2DM, and anxiety who presented with altered mental status. Most information obtained from her family due to patient's confusion. She was noted to be lethargic and somnolent for the last 24 hrs prior to admission. Her symptoms continue to worsened and decision was made to bring patient to the ED.   Recent hospitalization 04/14 to 08/23/22 for subdural and subarachnoid hemorrhage due to mechanical fall. Her urine culture was positive for ESBL Klebsiella pneumonia, and was treated with Fosfomycin.   In the ED patient her temperature was 100,0 F, blood pressure 128/59, HR 73, RR 16, 02 sat 96%, lungs with no wheezing or rales, heart with S1 and S2 present and rhythmic, abdomen with no distention, no lower extremity edema. Patient only saying "yeah", not responding to specific questions or commands.    Na 134, K 2,9 Cl 98, bicarbonate 27, glucose 314, bun 15 cr 0,70 AST 34, ALT 35, T Bil 2.1  Wbc 6,3 hgb 12.1 plt 108   Urine analysis SG 1,020, protein trace, >1000 glucose, moderate leukocytes, > 50 wbc,   Head CT with resolution of previously subdural hematoma along the falx. Persistent subdural hematoma noted along the left tentorium. The overall density is decreased and the thickness has decreased as well.  No new focal hemorrhage noted.    Chest radiograph with no cardiomegaly, with bilateral hilar vascular congestion.   EKG 83 bpm, normal axis, qtc 517, sinus rhythm with no significant ST segment or T wave changes.   Assessment and Plan: * Acute lower UTI Wbc is 4,0.  Patient has been afebrile.   Urine cultures from last hospitalization positive for Klebsiella Pneumonia ESBL.   Plan to transition antibiotic therapy to Imipenem.  Continue to follow up cell count and temperature curve.  Follow up on cultures.  Add contact precautions.    Acute metabolic encephalopathy Multifactorial metabolic encephalopathy present on admission. Today has resolved.  Plan to continue supportive medical therapy, including antibiotic therapy for urine infection,   Head CT with no recurrent or new bleeding.  Continue Pt and Ot.   Hypokalemia Renal function with serum cr at 0,84, with K at 3,0 and serum bicarbonate at 22. Na 135 and Mg 1,7  Plan to continue K correction with 2 doses of Kcl 40 meq. Add 4 g Mag sulfate.  Follow up renal function and electrolytes. Hold on IV fluids and diuretics for now.   Liver cirrhosis secondary to NASH (HCC) No clinical signs of decompensated liver disease. Continue supportive medical therapy.   Chronic diastolic CHF (congestive heart failure) (HCC) 2023 echocardiogram with preserved LV systolic function, EF 60 to 65%. No LVH, RV systolic function preserved. RVSP 30,5 mmHg. La with mild to moderate dilatation. Mild MR and mild MR stenosis. Mild TR   Patient euvolemic.  Plan to hold on IV fluids and diuretics. Continue blood pressure monitoring.   DM2 (diabetes mellitus, type 2) (HCC) Uncontrolled hyperglycemia.   Fasting glucose 319. Plan to continue insulin sliding scale and basal insulin.  Patient is tolerating po well.   Wound of left leg Continue wound care  Thrombocytopenia (HCC) Chronic anemia and thrombocytopenia. Cell count stable with hgb at 10,9 and plt at 100.  Close follow up.   Pressure injury of skin Continue local skin care.         Subjective: Patient is feeling better, her mentation has improved, but continue very weak and deconditioned   Physical Exam: Vitals:   09/03/22 1900 09/04/22  0428 09/04/22 0500 09/04/22 0745  BP:  129/74  112/62  Pulse:  76  73  Resp: 17 17    Temp: 98.7 F (37.1 C) 97.7 F (36.5 C)  98 F (36.7 C)  TempSrc: Axillary Oral  Oral  SpO2: 99% 98%  98%  Weight:   56.1 kg    Neurology awake and alert, follows commands and responds to questions ENT with mild pallor Cardiovascular with S1 and S2 present and rhythmic, positive systolic murmur at the right lower sternal border. Respiratory with no wheezing or rales Abdomen with no distention  No lower extremity edema  Data Reviewed:    Family Communication: I spoke with patient's husband at the bedside, we talked in detail about patient's condition, plan of care and prognosis and all questions were addressed.   Disposition: Status is: Inpatient Remains inpatient appropriate because: IV antibiotic therapy   Planned Discharge Destination: Home  Author: Coralie Keens, MD 09/04/2022 10:43 AM  For on call review [email or web address].

## 2022-09-04 NOTE — Assessment & Plan Note (Signed)
Pressure wound stage 1, at the back of right leg, present on admission.  Pressure Injury 09/03/22 Sacrum Right;Medial Stage 2 -  Partial thickness loss of dermis presenting as a shallow open injury with a red, pink wound bed without slough. (Active)  09/03/22 1151  Location: Sacrum  Location Orientation: Right;Medial  Staging: Stage 2 -  Partial thickness loss of dermis presenting as a shallow open injury with a red, pink wound bed without slough.  Wound Description (Comments):   Present on Admission: Yes

## 2022-09-04 NOTE — Assessment & Plan Note (Signed)
Continue local skin care.  

## 2022-09-04 NOTE — Evaluation (Signed)
Physical Therapy Evaluation Patient Details Name: [name] MRN: [id number] DOB: [birth year] Today's Date: 09/04/2022  History of Present Illness  77 yo female presenting 4/26 with AMS. Of note, pt recently admitted 4/14-4/16 due to a fall at home which caused a subdural hematoma, subarachnoid hemorrhage and multiple lumbar spinal vertebral fractures. Work up this admission shows UTI. PMH includes NASH cirrhosis, diastolic CHF, DMII, HLD, anxiety, lumbar fusion, CVA.   Clinical Impression  Pt in bed upon arrival of PT, agreeable to evaluation at this time. Prior to admission the pt was ambulating with modI using SPC or rollator, living with her spouse and able to complete ADLs and IADLs. The pt now presents with limitations in functional mobility, strength, power, endurance, and stability which are complicated by current lack of orientation, safety awareness, and memory. The pt was able to follow simple commands through session, but current cognition is significantly limited (see description below). Pt is currently at high risk for continued falls, recommend continued skilled PT acutely and continued follow up at home after d/c.         Recommendations for follow up therapy are one component of a multi-disciplinary discharge planning process, led by the attending physician.  Recommendations may be updated based on patient status, additional functional criteria and insurance authorization.  Follow Up Recommendations       Assistance Recommended at Discharge Frequent or constant Supervision/Assistance  Patient can return home with the following  A little help with bathing/dressing/bathroom;Assistance with cooking/housework;Assist for transportation;Help with stairs or ramp for entrance    Equipment Recommendations None recommended by PT  Recommendations for Other Services       Functional Status Assessment Patient has had a recent decline in their functional status and  demonstrates the ability to make significant improvements in function in a reasonable and predictable amount of time.     Precautions / Restrictions Precautions Precautions: Fall Precaution Comments: history of multiple falls Restrictions Weight Bearing Restrictions: No      Mobility  Bed Mobility Overal bed mobility: Needs Assistance Bed Mobility: Supine to Sit     Supine to sit: Mod assist     General bed mobility comments: moda to elevate trunk, pt able to move LE well. increased time    Transfers Overall transfer level: Needs assistance Equipment used: Rolling walker (2 wheels) Transfers: Sit to/from Stand Sit to Stand: Min assist, Min guard           General transfer comment: progression from minA to minG within session, increased time to rise but no overt LOB    Ambulation/Gait Ambulation/Gait assistance: Min guard, Min assist Gait Distance (Feet): 150 Feet Assistive device: Rolling walker (2 wheels) Gait Pattern/deviations: Step-through pattern, Drifts right/left Gait velocity: functional Gait velocity interpretation: <1.31 ft/sec, indicative of household ambulator   General Gait Details: intermittently running into objects on L side, needed minA to correct and redirect RW.     Balance Overall balance assessment: Needs assistance Sitting-balance support: No upper extremity supported, Feet supported Sitting balance-Leahy Scale: Good     Standing balance support: Bilateral upper extremity supported Standing balance-Leahy Scale: Fair                               Pertinent Vitals/Pain Pain Assessment Pain Assessment: Faces Faces Pain Scale: Hurts a little bit Pain Intervention(s): Monitored during session    Home Living Family/patient expects to be discharged to:: Private residence Living Arrangements: Spouse/significant  other Available Help at Discharge: Family;Available 24 hours/day Type of Home: House Home Access: Stairs to  enter Entrance Stairs-Rails: Doctor, general practice of Steps: 4   Home Layout: One level Home Equipment: Grab bars - tub/shower;Rolling Walker (2 wheels);Toilet riser;Cane - single point;Rollator (4 wheels);Shower seat - built in;Hospital bed;BSC/3in1      Prior Function Prior Level of Function : Independent/Modified Independent;History of Falls (last six months)             Mobility Comments: spouse reports ambulation with walker since d/c without falls ADLs Comments: Does ADLs and IADLs     Hand Dominance   Dominant Hand: Right    Extremity/Trunk Assessment   Upper Extremity Assessment Upper Extremity Assessment: Generalized weakness    Lower Extremity Assessment Lower Extremity Assessment: Generalized weakness LLE Deficits / Details: wounds to L calf    Cervical / Trunk Assessment Cervical / Trunk Assessment: Normal  Communication   Communication: HOH  Cognition Arousal/Alertness: Awake/alert Behavior During Therapy: WFL for tasks assessed/performed Overall Cognitive Status: Impaired/Different from baseline Area of Impairment: Orientation, Memory, Following commands, Safety/judgement, Awareness, Problem solving                 Orientation Level: Disoriented to, Person, Place, Time, Situation   Memory: Decreased recall of precautions, Decreased short-term memory Following Commands: Follows one step commands inconsistently, Follows one step commands with increased time Safety/Judgement: Decreased awareness of safety, Decreased awareness of deficits Awareness: Intellectual Problem Solving: Slow processing, Decreased initiation, Requires verbal cues General Comments: pt unable to state birthday, initially giving incorrect last name as well, unaware she is at hospital, did not know year or situation. After informing pt, she was later unable to recall information given earlier in session. does follow simple commands well        General Comments  General comments (skin integrity, edema, etc.): VSS on RA, spouse present and supportive    Exercises     Assessment/Plan    PT Assessment Patient needs continued PT services  PT Problem List Decreased balance;Decreased mobility;Decreased knowledge of use of DME;Decreased safety awareness;Decreased knowledge of precautions       PT Treatment Interventions DME instruction;Gait training;Stair training;Functional mobility training;Therapeutic activities;Therapeutic exercise;Balance training;Neuromuscular re-education;Patient/family education    PT Goals (Current goals can be found in the Care Plan section)  Acute Rehab PT Goals Patient Stated Goal: to return home PT Goal Formulation: With patient Time For Goal Achievement: 09/18/22 Potential to Achieve Goals: Good    Frequency Min 3X/week        AM-PAC PT "6 Clicks" Mobility  Outcome Measure Help needed turning from your back to your side while in a flat bed without using bedrails?: A Little Help needed moving from lying on your back to sitting on the side of a flat bed without using bedrails?: A Little Help needed moving to and from a bed to a chair (including a wheelchair)?: A Little Help needed standing up from a chair using your arms (e.g., wheelchair or bedside chair)?: A Little Help needed to walk in hospital room?: A Little Help needed climbing 3-5 steps with a railing? : A Little 6 Click Score: 18    End of Session Equipment Utilized During Treatment: Gait belt Activity Tolerance: Patient tolerated treatment well Patient left: in chair;with call bell/phone within reach;with family/visitor present Nurse Communication: Mobility status PT Visit Diagnosis: Other abnormalities of gait and mobility (R26.89);History of falling (Z91.81)    Time: 1610-9604 PT Time Calculation (min) (ACUTE ONLY): 33 min  Charges:   PT Evaluation $PT Eval Low Complexity: 1 Low PT Treatments $Gait Training: 8-22 mins        Vickki Muff, PT, DPT   Acute Rehabilitation Department Office [phone] Secure Chat Communication Preferred  Ronnie Derby 09/04/2022, 10:32 AM

## 2022-09-04 NOTE — Hospital Course (Signed)
Mrs, [name] was admitted to the hospital with the working diagnosis of acute metabolic encephalopathy due to urinary tract infection.   77 yo female with the past medical history of NASH cirrhosis sp TIPs, diastolic heart failure, chronic anemia, T2DM, and anxiety who presented with altered mental status. Most information obtained from her family due to patient's confusion. She was noted to be lethargic and somnolent for the last 24 hrs prior to admission. Her symptoms continue to worsened and decision was made to bring patient to the ED.   Recent hospitalization 04/14 to 08/23/22 for subdural and subarachnoid hemorrhage due to mechanical fall. Her urine culture was positive for ESBL Klebsiella pneumonia, and was treated with Fosfomycin.   In the ED patient her temperature was 100,0 F, blood pressure 128/59, HR 73, RR 16, 02 sat 96%, lungs with no wheezing or rales, heart with S1 and S2 present and rhythmic, abdomen with no distention, no lower extremity edema. Patient only saying "yeah", not responding to specific questions or commands.    Na 134, K 2,9 Cl 98, bicarbonate 27, glucose 314, bun 15 cr 0,70 AST 34, ALT 35, T Bil 2.1  Wbc 6,3 hgb 12.1 plt 108   Urine analysis SG 1,020, protein trace, >1000 glucose, moderate leukocytes, > 50 wbc,   Head CT with resolution of previously subdural hematoma along the falx. Persistent subdural hematoma noted along the left tentorium. The overall density is decreased and the thickness has decreased as well.  No new focal hemorrhage noted.    Chest radiograph with no cardiomegaly, with bilateral hilar vascular congestion.   EKG 83 bpm, normal axis, qtc 517, sinus rhythm with no significant ST segment or T wave changes.   04/29 patient responding to IV antibiotic therapy, continue very weak and deconditioned. Not back to her baseline.

## 2022-09-04 NOTE — Assessment & Plan Note (Signed)
No clinical signs of decompensated liver disease. Continue supportive medical therapy.  On lactulose and rifaximin.

## 2022-09-04 NOTE — Assessment & Plan Note (Signed)
Repleted.   -Potassium at 3.6.   -Magnesium noted at 2.1.   -Currently on oral daily Lasix/diuretics.   -May need oral potassium supplementation 20 mEq daily however we will repeat labs in the AM.

## 2022-09-05 DIAGNOSIS — E876 Hypokalemia: Secondary | ICD-10-CM

## 2022-09-05 DIAGNOSIS — K7581 Nonalcoholic steatohepatitis (NASH): Secondary | ICD-10-CM

## 2022-09-05 DIAGNOSIS — N390 Urinary tract infection, site not specified: Secondary | ICD-10-CM | POA: Diagnosis not present

## 2022-09-05 DIAGNOSIS — G9341 Metabolic encephalopathy: Secondary | ICD-10-CM | POA: Diagnosis not present

## 2022-09-05 DIAGNOSIS — K7460 Unspecified cirrhosis of liver: Secondary | ICD-10-CM

## 2022-09-05 DIAGNOSIS — Z794 Long term (current) use of insulin: Secondary | ICD-10-CM

## 2022-09-05 DIAGNOSIS — I5032 Chronic diastolic (congestive) heart failure: Secondary | ICD-10-CM

## 2022-09-05 DIAGNOSIS — D696 Thrombocytopenia, unspecified: Secondary | ICD-10-CM

## 2022-09-05 DIAGNOSIS — E1159 Type 2 diabetes mellitus with other circulatory complications: Secondary | ICD-10-CM

## 2022-09-05 LAB — MAGNESIUM: Magnesium: 2.1 mg/dL (ref 1.7–2.4)

## 2022-09-05 LAB — BASIC METABOLIC PANEL
Anion gap: 4 — ABNORMAL LOW (ref 5–15)
BUN: 15 mg/dL (ref 8–23)
CO2: 24 mmol/L (ref 22–32)
Calcium: 8.8 mg/dL — ABNORMAL LOW (ref 8.9–10.3)
Chloride: 103 mmol/L (ref 98–111)
Creatinine, Ser: 0.89 mg/dL (ref 0.44–1.00)
GFR, Estimated: >60 mL/min (ref >60)
Glucose, Bld: 341 mg/dL — ABNORMAL HIGH (ref 70–99)
Potassium: 3.6 mmol/L (ref 3.5–5.1)
Sodium: 131 mmol/L — ABNORMAL LOW (ref 135–145)

## 2022-09-05 LAB — URINE CULTURE: Culture: >=100000 — AB

## 2022-09-05 LAB — GLUCOSE, CAPILLARY
Glucose-Capillary: 258 mg/dL — ABNORMAL HIGH (ref 70–99)
Glucose-Capillary: 289 mg/dL — ABNORMAL HIGH (ref 70–99)
Glucose-Capillary: 324 mg/dL — ABNORMAL HIGH (ref 70–99)
Glucose-Capillary: 338 mg/dL — ABNORMAL HIGH (ref 70–99)
Glucose-Capillary: 363 mg/dL — ABNORMAL HIGH (ref 70–99)

## 2022-09-05 MED ORDER — POTASSIUM CHLORIDE CRYS ER 20 MEQ PO TBCR
40.0000 meq | EXTENDED_RELEASE_TABLET | Freq: Once | ORAL | Status: AC
  Administered 2022-09-05: 40 meq via ORAL
  Filled 2022-09-05: qty 2

## 2022-09-05 MED ORDER — INSULIN ASPART 100 UNIT/ML IJ SOLN
4.0000 [IU] | Freq: Once | INTRAMUSCULAR | Status: AC
  Administered 2022-09-05: 4 [IU] via SUBCUTANEOUS

## 2022-09-05 MED ORDER — FUROSEMIDE 40 MG PO TABS
40.0000 mg | ORAL_TABLET | Freq: Every day | ORAL | Status: DC
  Administered 2022-09-05 – 2022-09-09 (×5): 40 mg via ORAL
  Filled 2022-09-05 (×5): qty 1

## 2022-09-05 MED ORDER — INSULIN GLARGINE-YFGN 100 UNIT/ML ~~LOC~~ SOLN
20.0000 [IU] | Freq: Two times a day (BID) | SUBCUTANEOUS | Status: DC
  Administered 2022-09-05 – 2022-09-06 (×3): 20 [IU] via SUBCUTANEOUS
  Filled 2022-09-05 (×4): qty 0.2

## 2022-09-05 NOTE — Progress Notes (Addendum)
Progress Note   Patient: [name] ZOX:[id number] DOB: 03-Oct-1945 DOA: 09/02/2022     2 DOS: the patient was seen and examined on 09/05/2022   Brief hospital course: [name] was admitted to the hospital with the working diagnosis of acute metabolic encephalopathy due to urinary tract infection.   77 yo female with the past medical history of NASH cirrhosis sp TIPs, diastolic heart failure, chronic anemia, T2DM, and anxiety who presented with altered mental status. Most information obtained from her family due to patient's confusion. She was noted to be lethargic and somnolent for the last 24 hrs prior to admission. Her symptoms continue to worsened and decision was made to bring patient to the ED.   Recent hospitalization 04/14 to 08/23/22 for subdural and subarachnoid hemorrhage due to mechanical fall. Her urine culture was positive for ESBL Klebsiella pneumonia, and was treated with Fosfomycin.   In the ED patient her temperature was 100,0 F, blood pressure 128/59, HR 73, RR 16, 02 sat 96%, lungs with no wheezing or rales, heart with S1 and S2 present and rhythmic, abdomen with no distention, no lower extremity edema. Patient only saying "yeah", not responding to specific questions or commands.    Na 134, K 2,9 Cl 98, bicarbonate 27, glucose 314, bun 15 cr 0,70 AST 34, ALT 35, T Bil 2.1  Wbc 6,3 hgb 12.1 plt 108   Urine analysis SG 1,020, protein trace, >1000 glucose, moderate leukocytes, > 50 wbc,   Head CT with resolution of previously subdural hematoma along the falx. Persistent subdural hematoma noted along the left tentorium. The overall density is decreased and the thickness has decreased as well.  No new focal hemorrhage noted.    Chest radiograph with no cardiomegaly, with bilateral hilar vascular congestion.   EKG 83 bpm, normal axis, qtc 517, sinus rhythm with no significant ST segment or T wave changes.   04/29 patient responding to IV antibiotic therapy,  continue very weak and deconditioned. Not back to her baseline.   Assessment and Plan: * Acute lower UTI Urine cultures from last hospitalization positive for Klebsiella Pneumonia ESBL.   Continue antibiotic therapy to Imipenem.   Follow up cell count and temperature curve.  Follow up on cultures.  Contact precautions.    Acute metabolic encephalopathy Multifactorial metabolic encephalopathy present on admission.  Resolving encephalopathy.  Continue supportive medical therapy, including antibiotic therapy for urine infection,   Head CT with no recurrent or new bleeding.  Continue Pt and Ot.   Hypokalemia Renal function with serum cr at 0,89, K is 3,6 and serum bicarbonate at 24. Na is 131 (glucose is 341) and Mg at 2,1.   Continue K correction with oral Kcl. Follow up renal function and electrolytes in am.    Liver cirrhosis secondary to NASH (HCC) No clinical signs of decompensated liver disease. Continue supportive medical therapy.  On lactulose and rifaximin.   Chronic diastolic CHF (congestive heart failure) (HCC) 2023 echocardiogram with preserved LV systolic function, EF 60 to 65%. No LVH, RV systolic function preserved. RVSP 30,5 mmHg. La with mild to moderate dilatation. Mild MR and mild MR stenosis. Mild TR   Patient euvolemic.  Resume diuretic therapy with furosemide 40 mg po daily.  Continue blood pressure monitoring.   DM2 (diabetes mellitus, type 2) (HCC) Uncontrolled hyperglycemia.   Patient has used about 40 units of short acting insulin yesterday.  Plan to increase basal insulin to 20 units bid, and continue with insulin sliding scale.  Wound  of left leg Pressure wound stage 1, at the back of right leg, present on admission.  Lesions with resolution of wound to my examination.   Thrombocytopenia (HCC) Chronic anemia and thrombocytopenia. Cell count stable with hgb at 10,9 and plt at 100.  Close follow up.         Subjective: Patient with  no chest pain or dyspnea, her mentation continue to improve per her family at the bedside, but not back to baseline   Physical Exam: Vitals:   09/04/22 1425 09/04/22 2034 09/05/22 0442 09/05/22 0743  BP: (!) 112/47 (!) 118/49 (!) 142/69 (!) 116/57  Pulse: 79 83 85 77  Resp:  17 18   Temp: 98.6 F (37 C) 98 F (36.7 C) 98.9 F (37.2 C) 98.2 F (36.8 C)  TempSrc: Oral Oral Oral Oral  SpO2: 99% 97% 98% 96%  Weight:       Neurology awake and alert, deconditioned and ill looking appearing ENT with mild pallor Cardiovascular with S1 and S2 present and rhythmic, positive systolic at the apex No JVD Trace lower extremity edema more right than left Respiratory with no wheezing or rales, no rhonchi Abdomen with no distention  Data Reviewed:    Family Communication: I spoke with patient's husband and son at the bedside, we talked in detail about patient's condition, plan of care and prognosis and all questions were addressed.   Disposition: Status is: Inpatient Remains inpatient appropriate because: IV antibiotic therapy   Planned Discharge Destination: Home      Author: Coralie Keens, MD 09/05/2022 1:52 PM  For on call review [email or web address].

## 2022-09-05 NOTE — Plan of Care (Signed)
  Problem: Health Behavior/Discharge Planning: Goal: Ability to manage health-related needs will improve Outcome: Progressing   Problem: Activity: Goal: Risk for activity intolerance will decrease Outcome: Progressing   Problem: Nutrition: Goal: Adequate nutrition will be maintained Outcome: Progressing   

## 2022-09-05 NOTE — Inpatient Diabetes Management (Addendum)
Inpatient Diabetes Program Recommendations  AACE/ADA: New Consensus Statement on Inpatient Glycemic Control (2015)  Target Ranges:  Prepandial:   less than 140 mg/dL      Peak postprandial:   less than 180 mg/dL (1-2 hours)      Critically ill patients:  140 - 180 mg/dL   Lab Results  Component Value Date   GLUCAP 289 (H) 09/05/2022   HGBA1C 8.8 (H) 08/03/2022    Review of Glycemic Control  Latest Reference Range & Units 09/04/22 16:26 09/04/22 21:12 09/05/22 00:13 09/05/22 07:43  Glucose-Capillary 70 - 99 mg/dL 811 (H) 914 (H) 782 (H) 289 (H)  (H): Data is abnormally high  Diabetes history: DM2 Outpatient Diabetes medications:  Levemir 30 units QHS Humalog TOD (unsure of dose) Current orders for Inpatient glycemic control:  Semglee 20 units QHS Novolog 0-15 units TID  Inpatient Diabetes Program Recommendations:    Semglee 30 units QHS (home dose)  Spoke with patient and spouse at bedside. Her husband administers Levemir 30 units QHS.  He is unsure if she takes Humalog with meals.  She states she does take Humalog with meals if she is home but cannot remember Humalog dose.  She checks her BG every morning.    Will continue to follow while inpatient.  Thank you, Dulce Sellar, MSN, CDCES Diabetes Coordinator Inpatient Diabetes Program [phone] (team pager from 8a-5p)

## 2022-09-06 ENCOUNTER — Telehealth: Payer: Self-pay

## 2022-09-06 ENCOUNTER — Encounter (HOSPITAL_BASED_OUTPATIENT_CLINIC_OR_DEPARTMENT_OTHER): Payer: Medicare Other | Admitting: Internal Medicine

## 2022-09-06 DIAGNOSIS — N390 Urinary tract infection, site not specified: Secondary | ICD-10-CM | POA: Diagnosis not present

## 2022-09-06 DIAGNOSIS — K7581 Nonalcoholic steatohepatitis (NASH): Secondary | ICD-10-CM | POA: Diagnosis not present

## 2022-09-06 DIAGNOSIS — G9341 Metabolic encephalopathy: Secondary | ICD-10-CM | POA: Diagnosis not present

## 2022-09-06 DIAGNOSIS — E876 Hypokalemia: Secondary | ICD-10-CM | POA: Diagnosis not present

## 2022-09-06 LAB — BASIC METABOLIC PANEL
Anion gap: 5 (ref 5–15)
BUN: 15 mg/dL (ref 8–23)
CO2: 23 mmol/L (ref 22–32)
Calcium: 8.6 mg/dL — ABNORMAL LOW (ref 8.9–10.3)
Chloride: 104 mmol/L (ref 98–111)
Creatinine, Ser: 0.66 mg/dL (ref 0.44–1.00)
GFR, Estimated: >60 mL/min (ref >60)
Glucose, Bld: 310 mg/dL — ABNORMAL HIGH (ref 70–99)
Potassium: 3.6 mmol/L (ref 3.5–5.1)
Sodium: 132 mmol/L — ABNORMAL LOW (ref 135–145)

## 2022-09-06 LAB — GLUCOSE, CAPILLARY
Glucose-Capillary: 259 mg/dL — ABNORMAL HIGH (ref 70–99)
Glucose-Capillary: 301 mg/dL — ABNORMAL HIGH (ref 70–99)
Glucose-Capillary: 332 mg/dL — ABNORMAL HIGH (ref 70–99)
Glucose-Capillary: 334 mg/dL — ABNORMAL HIGH (ref 70–99)
Glucose-Capillary: 391 mg/dL — ABNORMAL HIGH (ref 70–99)

## 2022-09-06 LAB — CBC
HCT: 29 % — ABNORMAL LOW (ref 36.0–46.0)
Hemoglobin: 9.8 g/dL — ABNORMAL LOW (ref 12.0–15.0)
MCH: 31.9 pg (ref 26.0–34.0)
MCHC: 33.8 g/dL (ref 30.0–36.0)
MCV: 94.5 fL (ref 80.0–100.0)
Platelets: 84 10*3/uL — ABNORMAL LOW (ref 150–400)
RBC: 3.07 MIL/uL — ABNORMAL LOW (ref 3.87–5.11)
RDW: 15.9 % — ABNORMAL HIGH (ref 11.5–15.5)
WBC: 4 10*3/uL (ref 4.0–10.5)
nRBC: 0 % (ref 0.0–0.2)

## 2022-09-06 LAB — URINE CULTURE

## 2022-09-06 MED ORDER — INSULIN GLARGINE-YFGN 100 UNIT/ML ~~LOC~~ SOLN
25.0000 [IU] | Freq: Two times a day (BID) | SUBCUTANEOUS | Status: DC
  Administered 2022-09-06 – 2022-09-09 (×6): 25 [IU] via SUBCUTANEOUS
  Filled 2022-09-06 (×7): qty 0.25

## 2022-09-06 MED ORDER — INSULIN ASPART 100 UNIT/ML IJ SOLN
5.0000 [IU] | Freq: Once | INTRAMUSCULAR | Status: AC
  Administered 2022-09-06: 5 [IU] via SUBCUTANEOUS

## 2022-09-06 MED ORDER — INSULIN GLARGINE-YFGN 100 UNIT/ML ~~LOC~~ SOLN
5.0000 [IU] | Freq: Once | SUBCUTANEOUS | Status: AC
  Administered 2022-09-06: 5 [IU] via SUBCUTANEOUS
  Filled 2022-09-06: qty 0.05

## 2022-09-06 MED ORDER — SODIUM CHLORIDE 0.9 % IV SOLN
1.0000 g | Freq: Three times a day (TID) | INTRAVENOUS | Status: AC
  Administered 2022-09-06 – 2022-09-08 (×8): 1 g via INTRAVENOUS
  Filled 2022-09-06 (×8): qty 20

## 2022-09-06 NOTE — Plan of Care (Signed)
  Problem: Activity: Goal: Risk for activity intolerance will decrease Outcome: Progressing   Problem: Nutrition: Goal: Adequate nutrition will be maintained Outcome: Progressing   Problem: Coping: Goal: Level of anxiety will decrease Outcome: Progressing   Problem: Metabolic: Goal: Ability to maintain appropriate glucose levels will improve Outcome: Progressing

## 2022-09-06 NOTE — Telephone Encounter (Signed)
Arlys John , OT with [facility] called and left message on clinical intake voicemail requesting verbal orders for home helath OT for 1 time a week for 4 weeks for strength, balance and endurance. I called back and left detailed voicemail on confidential voicemail given verbal orders.

## 2022-09-06 NOTE — Progress Notes (Signed)
PROGRESS NOTE    [name]  ZOX:[id number] DOB: [birth year] DOA: 09/02/2022 PCP: Frederica Kuster, MD    Chief Complaint  Patient presents with   Altered Mental Status    Brief Narrative:  Mrs, Savary was admitted to the hospital with the working diagnosis of acute metabolic encephalopathy due to urinary tract infection.   77 yo female with the past medical history of NASH cirrhosis sp TIPs, diastolic heart failure, chronic anemia, T2DM, and anxiety who presented with altered mental status. Most information obtained from her family due to patient's confusion. She was noted to be lethargic and somnolent for the last 24 hrs prior to admission. Her symptoms continue to worsened and decision was made to bring patient to the ED.   Recent hospitalization 04/14 to 08/23/22 for subdural and subarachnoid hemorrhage due to mechanical fall. Her urine culture was positive for ESBL Klebsiella pneumonia, and was treated with Fosfomycin.   In the ED patient her temperature was 100,0 F, blood pressure 128/59, HR 73, RR 16, 02 sat 96%, lungs with no wheezing or rales, heart with S1 and S2 present and rhythmic, abdomen with no distention, no lower extremity edema. Patient only saying "yeah", not responding to specific questions or commands.    Na 134, K 2,9 Cl 98, bicarbonate 27, glucose 314, bun 15 cr 0,70 AST 34, ALT 35, T Bil 2.1  Wbc 6,3 hgb 12.1 plt 108   Urine analysis SG 1,020, protein trace, >1000 glucose, moderate leukocytes, > 50 wbc,   Head CT with resolution of previously subdural hematoma along the falx. Persistent subdural hematoma noted along the left tentorium. The overall density is decreased and the thickness has decreased as well.  No new focal hemorrhage noted.    Chest radiograph with no cardiomegaly, with bilateral hilar vascular congestion.   EKG 83 bpm, normal axis, qtc 517, sinus rhythm with no significant ST segment or T wave changes.   04/29 patient responding  to IV antibiotic therapy, continue very weak and deconditioned. Not back to her baseline.     Assessment & Plan:  Principal Problem:   Acute lower UTI Active Problems:   Acute metabolic encephalopathy   Hypokalemia   Liver cirrhosis secondary to NASH (HCC)   Chronic diastolic CHF (congestive heart failure) (HCC)   DM2 (diabetes mellitus, type 2) (HCC)   Wound of left leg   Thrombocytopenia (HCC)    Assessment and Plan: * Acute lower UTI Urine cultures from last hospitalization positive for Klebsiella Pneumonia ESBL.   -Urine cultures done during this hospitalization positive for ESBL Klebsiella pneumonia sensitive to imipenem. -Improving clinically. -Continue IV meropenem. Contact precautions.    Acute metabolic encephalopathy -Likely secondary to UTI. -Head CT done with no recurrent or new bleed, resolution of previously seen subdural hematoma along the falx, persistent subdural hematoma is noted along the left tentorium.  Overall density is decreased in thickness is decreased as well.  -Improving clinically. -Continue empiric IV antibiotics to complete course of treatment for UTI.  Hypokalemia Repleted.   -Potassium at 3.6.   -Magnesium noted at 2.1.   -Currently on oral daily Lasix/diuretics.   -May need oral potassium supplementation 20 mEq daily however we will repeat labs in the AM.    Liver cirrhosis secondary to NASH (HCC) -No signs of decompensation.   -Continue medical therapy with lactulose, Xifaxan, Lasix.    Chronic diastolic CHF (congestive heart failure) (HCC) - 2023 echocardiogram with preserved LV systolic function, EF 60 to 65%. No  LVH, RV systolic function preserved. RVSP 30,5 mmHg. La with mild to moderate dilatation. Mild MR and mild MR stenosis. Mild TR   -Euvolemic on examination. -Continue diuretic therapy of Lasix 40 mg daily. -May need oral daily potassium supplementation. -Repeat labs in the AM.  DM2 (diabetes mellitus, type 2)  (HCC) Uncontrolled hyperglycemia.  -Hemoglobin A1c 8.8 (08/03/2022) -Increase Semglee to 25 units twice daily. -SSI.  Wound of left leg Pressure wound stage 1, at the back of right leg, present on admission.  Pressure Injury 09/03/22 Sacrum Right;Medial Stage 2 -  Partial thickness loss of dermis presenting as a shallow open injury with a red, pink wound bed without slough. (Active)  09/03/22 1151  Location: Sacrum  Location Orientation: Right;Medial  Staging: Stage 2 -  Partial thickness loss of dermis presenting as a shallow open injury with a red, pink wound bed without slough.  Wound Description (Comments):   Present on Admission: Yes       Thrombocytopenia (HCC) Chronic anemia and thrombocytopenia. -Patient with no overt bleeding. -Hemoglobin stable at 9.8, platelet count stable at 84.         DVT prophylaxis: SCDs Code Status: Full Family Communication: Updated patient, daughter, son at bedside. Disposition: TBD  Status is: Inpatient Remains inpatient appropriate because: Severity of illness   Consultants:  None  Procedures:  CT head 09/02/2022 Chest x-ray 09/03/2022   Antimicrobials:  Anti-infectives (From admission, onward)    Start     Dose/Rate Route Frequency Ordered Stop   09/06/22 1400  meropenem (MERREM) 1 g in sodium chloride 0.9 % 100 mL IVPB        1 g 200 mL/hr over 30 Minutes Intravenous Every 8 hours 09/06/22 0747     09/04/22 1000  meropenem (MERREM) 1 g in sodium chloride 0.9 % 100 mL IVPB  Status:  Discontinued        1 g 200 mL/hr over 30 Minutes Intravenous Every 12 hours 09/04/22 0808 09/06/22 0747   09/03/22 2200  rifaximin (XIFAXAN) tablet 550 mg        550 mg Oral 2 times daily 09/03/22 1745     09/03/22 0600  piperacillin-tazobactam (ZOSYN) IVPB 3.375 g  Status:  Discontinued        3.375 g 12.5 mL/hr over 240 Minutes Intravenous Every 8 hours 09/02/22 2256 09/04/22 0806   09/02/22 2300  piperacillin-tazobactam (ZOSYN) IVPB 3.375  g        3.375 g 100 mL/hr over 30 Minutes Intravenous  Once 09/02/22 2251 09/03/22 0018         Subjective: Patient awake.  Alert.  Son and daughter at bedside.  Patient still with bouts of confusion however no longer lethargic or drowsy per family.  Objective: Vitals:   09/05/22 2045 09/06/22 0501 09/06/22 0753 09/06/22 1451  BP: (!) 135/55 (!) 135/58 (!) 124/48 (!) 111/45  Pulse: 78 70 76 75  Resp: 18 18    Temp: 98.1 F (36.7 C) 98.7 F (37.1 C) 97.6 F (36.4 C) 98.6 F (37 C)  TempSrc: Oral Oral Oral Oral  SpO2: 95% 99% 95% 98%  Weight:       No intake or output data in the 24 hours ending 09/06/22 1903 Filed Weights   09/02/22 2122 09/04/22 0500  Weight: 55.6 kg 56.1 kg    Examination:  General exam: NAD. Respiratory system: CTAB.  No wheezes, no crackles, no rhonchi.  Fair air movement.  Speaking in full sentences.  Cardiovascular system: Regular rate  rhythm no murmurs rubs or gallops.  No JVD.  No lower extremity edema.  Gastrointestinal system: Abdomen is nondistended, soft and nontender. No organomegaly or masses felt. Normal bowel sounds heard. Central nervous system: Alert.  Moving extremities spontaneously.  No focal neurological deficits. Extremities: Symmetric 5 x 5 power. Skin: No rashes, lesions or ulcers Psychiatry: Judgement and insight appear poor. Mood & affect appropriate.     Data Reviewed:   CBC: Recent Labs  Lab 09/02/22 2124 09/04/22 0125 09/06/22 0437  WBC 6.3 4.0 4.0  HGB 12.1 10.9* 9.8*  HCT 36.4 33.1* 29.0*  MCV 94.1 95.1 94.5  PLT 108* 100* 84*    Basic Metabolic Panel: Recent Labs  Lab 09/02/22 2124 09/02/22 2254 09/03/22 1257 09/04/22 0122 09/04/22 0125 09/05/22 0129 09/06/22 0437  NA 134*  --  136  --  135 131* 132*  K 2.9*  --  3.1*  --  3.0* 3.6 3.6  CL 98  --  106  --  105 103 104  CO2 27  --  23  --  22 24 23   GLUCOSE 314*  --  257* 316* 319* 341* 310*  BUN 15  --  11  --  15 15 15   CREATININE 0.70  --   0.73  --  0.84 0.89 0.66  CALCIUM 9.8  --  8.5*  --  8.9 8.8* 8.6*  MG  --  1.7  --   --   --  2.1  --     GFR: Estimated Creatinine Clearance: 50.3 mL/min (by C-G formula based on SCr of 0.66 mg/dL).  Liver Function Tests: Recent Labs  Lab 09/02/22 2124 09/04/22 0125  AST 34 37  ALT 35 30  ALKPHOS 92 79  BILITOT 2.1* 1.7*  PROT 6.9 5.6*  ALBUMIN 3.1* 2.1*    CBG: Recent Labs  Lab 09/05/22 1950 09/06/22 0032 09/06/22 0754 09/06/22 1116 09/06/22 1614  GLUCAP 258* 391* 259* 332* 334*     Recent Results (from the past 240 hour(s))  Urine Culture     Status: Abnormal   Collection Time: 09/02/22 10:50 PM   Specimen: In/Out Cath Urine  Result Value Ref Range Status   Specimen Description   Final    IN/OUT CATH URINE Performed at [facility], [address] [place], [place] [address]    Special Requests   Final    NONE Performed at [facility], [address] [place], [place] [address]    Culture (A)  Final    >=100,000 COLONIES/mL KLEBSIELLA PNEUMONIAE Confirmed Extended Spectrum Beta-Lactamase Producer (ESBL).  In bloodstream infections from ESBL organisms, carbapenems are preferred over piperacillin/tazobactam. They are shown to have a lower risk of mortality.    Report Status 09/06/2022 FINAL  Final   Organism ID, Bacteria KLEBSIELLA PNEUMONIAE (A)  Final      Susceptibility   Klebsiella pneumoniae - MIC*    AMPICILLIN >=32 RESISTANT Resistant     CEFAZOLIN >=64 RESISTANT Resistant     CEFEPIME >=32 RESISTANT Resistant     CEFTRIAXONE >=64 RESISTANT Resistant     CIPROFLOXACIN 2 RESISTANT Resistant     GENTAMICIN >=16 RESISTANT Resistant     IMIPENEM <=0.25 SENSITIVE Sensitive     NITROFURANTOIN 128 RESISTANT Resistant     TRIMETH/SULFA >=320 RESISTANT Resistant     AMPICILLIN/SULBACTAM >=32 RESISTANT Resistant     PIP/TAZO <=4 SENSITIVE Sensitive     * >=100,000 COLONIES/mL KLEBSIELLA PNEUMONIAE  Radiology Studies: No results found.      Scheduled Meds:  artificial tears   Both Eyes QHS   cyanocobalamin  1,000 mcg Oral Daily   furosemide  40 mg Oral Daily   insulin aspart  0-15 Units Subcutaneous TID WC   insulin glargine-yfgn  25 Units Subcutaneous BID   lactulose  20 g Oral Daily   nystatin cream  1 Application Topical BID   pantoprazole  40 mg Oral Daily   rifaximin  550 mg Oral BID   sodium chloride flush  3 mL Intravenous Q12H   Continuous Infusions:  meropenem (MERREM) IV 1 g (09/06/22 1328)     LOS: 3 days    Time spent: 40 mins    Ramiro Harvest, MD [facility]   To contact the attending provider between 7A-7P or the covering provider during after hours 7P-7A, please log into the web site [email or web address] and access using universal [place] password for that web site. If you do not have the password, please call the hospital operator.  09/06/2022, 7:03 PM

## 2022-09-06 NOTE — Progress Notes (Signed)
Physical Therapy Treatment Patient Details Name: [name] MRN: [id number] DOB: 07-16-1945 Today's Date: 09/06/2022   History of Present Illness 77 yo female presenting 4/26 with AMS. Of note, pt recently admitted 4/14-4/16 due to a fall at home which caused a subdural hematoma, subarachnoid hemorrhage and multiple lumbar spinal vertebral fractures. Work up this admission shows UTI. PMH includes NASH cirrhosis, diastolic CHF, DMII, HLD, anxiety, lumbar fusion, CVA.    PT Comments    Pt received in supine and agreeable to session. Pt demonstrating good progress towards functional mobility goals. Pt able to tolerate increased gait distance and stair trial this session with one seated rest break and up to min guard for safety. Pt intermittently following commands and responding appropriately to questions throughout session. Pt continues to benefit from PT services to progress toward functional mobility goals.     Recommendations for follow up therapy are one component of a multi-disciplinary discharge planning process, led by the attending physician.  Recommendations may be updated based on patient status, additional functional criteria and insurance authorization.     Assistance Recommended at Discharge Frequent or constant Supervision/Assistance  Patient can return home with the following A little help with bathing/dressing/bathroom;Assistance with cooking/housework;Assist for transportation;Help with stairs or ramp for entrance   Equipment Recommendations  None recommended by PT    Recommendations for Other Services       Precautions / Restrictions Precautions Precautions: Fall Precaution Comments: history of multiple falls Restrictions Weight Bearing Restrictions: No     Mobility  Bed Mobility Overal bed mobility: Needs Assistance Bed Mobility: Supine to Sit, Sit to Supine     Supine to sit: Supervision, HOB elevated Sit to supine: Supervision, HOB elevated    General bed mobility comments: Pt able to perform bed mobility with cues for technique. Pt requiring total assist for supine scoot towards HOB.    Transfers Overall transfer level: Needs assistance Equipment used: Rolling walker (2 wheels) Transfers: Sit to/from Stand Sit to Stand: Min guard           General transfer comment: From EOB and mat table with cues for anterior weight shift    Ambulation/Gait Ambulation/Gait assistance: Min guard Gait Distance (Feet): 175 Feet Assistive device: Rolling walker (2 wheels) Gait Pattern/deviations: Step-through pattern, Drifts right/left       General Gait Details: Pt demonstrating steady gait with light support on RW. Pt requires multiple cues for direction due to pt continuing straight despite cues to turn. Cues for upright posture and RW proximity with little improvement.   Stairs Stairs: Yes Stairs assistance: Min guard Stair Management: Alternating pattern, Two rails, Step to pattern, Forwards Number of Stairs: 3 General stair comments: Pt demonstrating alternating pattern ascending and step-to pattern descending stairs. Slow, steady pace with min guard for safety.      Balance Overall balance assessment: Needs assistance Sitting-balance support: No upper extremity supported, Feet supported Sitting balance-Leahy Scale: Good Sitting balance - Comments: sitting EOB   Standing balance support: Bilateral upper extremity supported, During functional activity Standing balance-Leahy Scale: Fair Standing balance comment: with RW support                            Cognition Arousal/Alertness: Awake/alert Behavior During Therapy: WFL for tasks assessed/performed Overall Cognitive Status: Impaired/Different from baseline  General Comments: Pt HoH making command following inconsistent. Pt with inconsistent appropriate responses to questions.        Exercises       General Comments        Pertinent Vitals/Pain Pain Assessment Pain Assessment: Faces Faces Pain Scale: Hurts a little bit Pain Location: Pt did not specify Pain Descriptors / Indicators: Aching Pain Intervention(s): Monitored during session     PT Goals (current goals can now be found in the care plan section) Acute Rehab PT Goals Patient Stated Goal: to return home PT Goal Formulation: With patient Time For Goal Achievement: 09/18/22 Potential to Achieve Goals: Good Progress towards PT goals: Progressing toward goals    Frequency    Min 3X/week      PT Plan Current plan remains appropriate       AM-PAC PT "6 Clicks" Mobility   Outcome Measure  Help needed turning from your back to your side while in a flat bed without using bedrails?: A Little Help needed moving from lying on your back to sitting on the side of a flat bed without using bedrails?: A Little Help needed moving to and from a bed to a chair (including a wheelchair)?: A Little Help needed standing up from a chair using your arms (e.g., wheelchair or bedside chair)?: A Little Help needed to walk in hospital room?: A Little Help needed climbing 3-5 steps with a railing? : A Little 6 Click Score: 18    End of Session Equipment Utilized During Treatment: Gait belt Activity Tolerance: Patient tolerated treatment well Patient left: in bed;with call bell/phone within reach;with family/visitor present Nurse Communication: Mobility status PT Visit Diagnosis: Other abnormalities of gait and mobility (R26.89);History of falling (Z91.81)     Time: 1610-9604 PT Time Calculation (min) (ACUTE ONLY): 19 min  Charges:  $Gait Training: 8-22 mins                    Johny Shock, PTA Acute Rehabilitation Services Secure Chat Preferred  [phone]    Johny Shock 09/06/2022, 2:53 PM

## 2022-09-06 NOTE — Inpatient Diabetes Management (Addendum)
Inpatient Diabetes Program Recommendations  AACE/ADA: New Consensus Statement on Inpatient Glycemic Control (2015)  Target Ranges:  Prepandial:   less than 140 mg/dL      Peak postprandial:   less than 180 mg/dL (1-2 hours)      Critically ill patients:  140 - 180 mg/dL   Lab Results  Component Value Date   GLUCAP 332 (H) 09/06/2022   HGBA1C 8.8 (H) 08/03/2022    Review of Glycemic Control  Latest Reference Range & Units 09/05/22 19:50 09/06/22 00:32 09/06/22 07:54 09/06/22 11:16  Glucose-Capillary 70 - 99 mg/dL 811 (H) 914 (H) 782 (H) 332 (H)  (H): Data is abnormally high  Diabetes history: DM2 Outpatient Diabetes medications:  Levemir 30 units QHS Humalog TID (unsure of dose) Current orders for Inpatient glycemic control:  Semglee 20 units BID Novolog 0-15 units TID   Inpatient Diabetes Program Recommendations:     Semglee 25 units BID Novolog 3 units TID with meals if she eats at least 50%   Will continue to follow while inpatient.   Thank you, Dulce Sellar, MSN, CDCES Diabetes Coordinator Inpatient Diabetes Program [phone] (team pager from 8a-5p)

## 2022-09-07 ENCOUNTER — Ambulatory Visit: Payer: Medicare Other | Admitting: Family Medicine

## 2022-09-07 DIAGNOSIS — N390 Urinary tract infection, site not specified: Secondary | ICD-10-CM | POA: Diagnosis not present

## 2022-09-07 LAB — CBC WITH DIFFERENTIAL/PLATELET
Abs Immature Granulocytes: 0.02 10*3/uL (ref 0.00–0.07)
Basophils Absolute: 0 10*3/uL (ref 0.0–0.1)
Basophils Relative: 1 %
Eosinophils Absolute: 0.1 10*3/uL (ref 0.0–0.5)
Eosinophils Relative: 2 %
HCT: 30.8 % — ABNORMAL LOW (ref 36.0–46.0)
Hemoglobin: 10 g/dL — ABNORMAL LOW (ref 12.0–15.0)
Immature Granulocytes: 1 %
Lymphocytes Relative: 16 %
Lymphs Abs: 0.6 10*3/uL — ABNORMAL LOW (ref 0.7–4.0)
MCH: 31.3 pg (ref 26.0–34.0)
MCHC: 32.5 g/dL (ref 30.0–36.0)
MCV: 96.3 fL (ref 80.0–100.0)
Monocytes Absolute: 0.4 10*3/uL (ref 0.1–1.0)
Monocytes Relative: 11 %
Neutro Abs: 2.7 10*3/uL (ref 1.7–7.7)
Neutrophils Relative %: 69 %
Platelets: 83 10*3/uL — ABNORMAL LOW (ref 150–400)
RBC: 3.2 MIL/uL — ABNORMAL LOW (ref 3.87–5.11)
RDW: 15.9 % — ABNORMAL HIGH (ref 11.5–15.5)
WBC: 3.8 10*3/uL — ABNORMAL LOW (ref 4.0–10.5)
nRBC: 0 % (ref 0.0–0.2)

## 2022-09-07 LAB — MAGNESIUM: Magnesium: 1.8 mg/dL (ref 1.7–2.4)

## 2022-09-07 LAB — BASIC METABOLIC PANEL
Anion gap: 7 (ref 5–15)
BUN: 13 mg/dL (ref 8–23)
CO2: 24 mmol/L (ref 22–32)
Calcium: 8.8 mg/dL — ABNORMAL LOW (ref 8.9–10.3)
Chloride: 100 mmol/L (ref 98–111)
Creatinine, Ser: 0.68 mg/dL (ref 0.44–1.00)
GFR, Estimated: >60 mL/min (ref >60)
Glucose, Bld: 305 mg/dL — ABNORMAL HIGH (ref 70–99)
Potassium: 3.3 mmol/L — ABNORMAL LOW (ref 3.5–5.1)
Sodium: 131 mmol/L — ABNORMAL LOW (ref 135–145)

## 2022-09-07 LAB — GLUCOSE, CAPILLARY
Glucose-Capillary: 238 mg/dL — ABNORMAL HIGH (ref 70–99)
Glucose-Capillary: 379 mg/dL — ABNORMAL HIGH (ref 70–99)
Glucose-Capillary: 465 mg/dL — ABNORMAL HIGH (ref 70–99)
Glucose-Capillary: 369 mg/dL — ABNORMAL HIGH (ref 70–99)

## 2022-09-07 MED ORDER — HYDRALAZINE HCL 20 MG/ML IJ SOLN: 10.0000 mg | INTRAMUSCULAR | Status: DC | PRN

## 2022-09-07 MED ORDER — INSULIN ASPART 100 UNIT/ML IJ SOLN
3.0000 [IU] | Freq: Three times a day (TID) | INTRAMUSCULAR | Status: DC
  Administered 2022-09-07 – 2022-09-09 (×7): 3 [IU] via SUBCUTANEOUS

## 2022-09-07 MED ORDER — POTASSIUM CHLORIDE CRYS ER 20 MEQ PO TBCR
40.0000 meq | EXTENDED_RELEASE_TABLET | Freq: Once | ORAL | Status: AC
  Administered 2022-09-07: 40 meq via ORAL
  Filled 2022-09-07: qty 2

## 2022-09-07 MED ORDER — TRAZODONE HCL 50 MG PO TABS: 50.0000 mg | ORAL_TABLET | Freq: Every evening | ORAL | Status: DC | PRN

## 2022-09-07 MED ORDER — GUAIFENESIN 100 MG/5ML PO LIQD: 5.0000 mL | ORAL | Status: DC | PRN

## 2022-09-07 MED ORDER — INSULIN ASPART 100 UNIT/ML IJ SOLN
5.0000 [IU] | Freq: Once | INTRAMUSCULAR | Status: AC
  Administered 2022-09-07: 5 [IU] via SUBCUTANEOUS

## 2022-09-07 MED ORDER — METOPROLOL TARTRATE 5 MG/5ML IV SOLN: 5.0000 mg | INTRAVENOUS | Status: DC | PRN

## 2022-09-07 MED ORDER — INSULIN ASPART 100 UNIT/ML IJ SOLN
15.0000 [IU] | Freq: Once | INTRAMUSCULAR | Status: AC
  Administered 2022-09-07: 15 [IU] via SUBCUTANEOUS

## 2022-09-07 MED ORDER — SENNOSIDES-DOCUSATE SODIUM 8.6-50 MG PO TABS: 1.0000 | ORAL_TABLET | Freq: Every evening | ORAL | Status: DC | PRN

## 2022-09-07 NOTE — Care Management Important Message (Signed)
Important Message  Patient Details  Name: [name] MRN: [id number] Date of Birth: July 13, 1945   Medicare Important Message Given:  Yes     Sherilyn Banker 09/07/2022, 1:16 PM

## 2022-09-07 NOTE — Progress Notes (Signed)
PROGRESS NOTE    [name]  JYN:[id number] DOB: Oct 19, 1945 DOA: 09/02/2022 PCP: Frederica Kuster, MD   Brief Narrative:  77 year old with history of Elita Boone cirrhosis status post TIPS, diastolic CHF, chronic anemia, DM2, anxiety admitted for altered mental status.  She was admitted here recently from 4/14-4/16/24 for subdural/subarachnoid hemorrhage secondary to mechanical fall.  During that admission urine cultures grew ESBL Klebsiella treated with fosfomycin.  Patient now returns with fevers, chills and confusion.  CT of the head showed improvement in previous subdural hematoma.  Patient started on meropenem   Assessment & Plan:  Principal Problem:   Acute lower UTI Active Problems:   Acute metabolic encephalopathy   Hypokalemia   Liver cirrhosis secondary to NASH (HCC)   Chronic diastolic CHF (congestive heart failure) (HCC)   DM2 (diabetes mellitus, type 2) (HCC)   Wound of left leg   Thrombocytopenia (HCC)     Assessment and Plan: * Acute lower UTI Urine cultures are consistent with Klebsiella ESBL sensitive to Zosyn and imipenem. Initially patient was on Zosyn and thereafter switched to meropenem.  Today is day 6, tomorrow last day to complete total 7-day course.  Acute metabolic encephalopathy -Likely secondary to UTI.  CT of the head showed previously improved subdural hematoma.  Clinically improving.  Hypokalemia As needed repletion  Liver cirrhosis secondary to NASH (HCC) -No signs of decompensation.   -Continue medical therapy with lactulose, Xifaxan, Lasix.    Chronic diastolic CHF (congestive heart failure) (HCC) - 2023 echocardiogram with preserved LV systolic function, EF 60 to 65%. No LVH, RV systolic function preserved. RVSP 30,5 mmHg. La with mild to moderate dilatation. Mild MR and mild MR stenosis. Mild TR  -Overall appears to be euvolemic.  Continue daily Lasix, rifaximin  DM2 (diabetes mellitus, type 2) (HCC) Uncontrolled secondary to  hyperglycemia.  Recent A1c 8.8.  Semglee 25 units twice daily.  Sliding scale   Thrombocytopenia (HCC) Secondary to underlying chronic disease, stable   PT-Home health   DVT prophylaxis: Place and maintain sequential compression device Start: 09/06/22 1854 SCDs Start: 09/03/22 1204 Code Status: Full code Family Communication: Significant other at bedside Status is: Inpatient Continue IV meropenem for 1 more day to complete total 7-day course.   Pressure Injury 09/03/22 Sacrum Right;Medial Stage 2 -  Partial thickness loss of dermis presenting as a shallow open injury with a red, pink wound bed without slough. (Active)  09/03/22 1151  Location: Sacrum  Location Orientation: Right;Medial  Staging: Stage 2 -  Partial thickness loss of dermis presenting as a shallow open injury with a red, pink wound bed without slough.  Wound Description (Comments):   Present on Admission: Yes     Diet Orders (From admission, onward)     Start     Ordered   09/03/22 1205  Diet Carb Modified Fluid consistency: Thin; Room service appropriate? Yes  Diet effective now       Comments: And low-sodium  Question Answer Comment  Diet-HS Snack? Nothing   Calorie Level Medium 1600-2000   Fluid consistency: Thin   Room service appropriate? Yes      09/03/22 1206            Subjective:  Overall doing better.  No new complaints  Examination:  General exam: Appears calm and comfortable  Respiratory system: Clear to auscultation. Respiratory effort normal. Cardiovascular system: S1 & S2 heard, RRR. No JVD, murmurs, rubs, gallops or clicks. No pedal edema. Gastrointestinal system: Abdomen is nondistended, soft and  nontender. No organomegaly or masses felt. Normal bowel sounds heard. Central nervous system: Alert and oriented. No focal neurological deficits. Extremities: Symmetric 5 x 5 power. Skin: No rashes, lesions or ulcers Psychiatry: Judgement and insight appear normal. Mood & affect  appropriate.  Objective: Vitals:   09/06/22 2029 09/07/22 0400 09/07/22 0407 09/07/22 0711  BP: (!) 137/55 (!) 131/58  (!) 125/55  Pulse: 79 77  81  Resp: 17 16    Temp: 98.4 F (36.9 C) 98.7 F (37.1 C)  98.4 F (36.9 C)  TempSrc: Oral Oral  Oral  SpO2: 99% 96%  94%  Weight:   58.8 kg     Intake/Output Summary (Last 24 hours) at 09/07/2022 0837 Last data filed at 09/06/2022 2217 Gross per 24 hour  Intake 100 ml  Output --  Net 100 ml   Filed Weights   09/02/22 2122 09/04/22 0500 09/07/22 0407  Weight: 55.6 kg 56.1 kg 58.8 kg    Scheduled Meds:  artificial tears   Both Eyes QHS   cyanocobalamin  1,000 mcg Oral Daily   furosemide  40 mg Oral Daily   insulin aspart  0-15 Units Subcutaneous TID WC   insulin glargine-yfgn  25 Units Subcutaneous BID   lactulose  20 g Oral Daily   nystatin cream  1 Application Topical BID   pantoprazole  40 mg Oral Daily   rifaximin  550 mg Oral BID   sodium chloride flush  3 mL Intravenous Q12H   Continuous Infusions:  meropenem (MERREM) IV 1 g (09/07/22 0532)    Nutritional status     Body mass index is 22.96 kg/m.  Data Reviewed:   CBC: Recent Labs  Lab 09/02/22 2124 09/04/22 0125 09/06/22 0437 09/07/22 0146  WBC 6.3 4.0 4.0 3.8*  NEUTROABS  --   --   --  2.7  HGB 12.1 10.9* 9.8* 10.0*  HCT 36.4 33.1* 29.0* 30.8*  MCV 94.1 95.1 94.5 96.3  PLT 108* 100* 84* 83*   Basic Metabolic Panel: Recent Labs  Lab 09/02/22 2254 09/03/22 1257 09/04/22 0122 09/04/22 0125 09/05/22 0129 09/06/22 0437 09/07/22 0146  NA  --  136  --  135 131* 132* 131*  K  --  3.1*  --  3.0* 3.6 3.6 3.3*  CL  --  106  --  105 103 104 100  CO2  --  23  --  22 24 23 24   GLUCOSE  --  257* 316* 319* 341* 310* 305*  BUN  --  11  --  15 15 15 13   CREATININE  --  0.73  --  0.84 0.89 0.66 0.68  CALCIUM  --  8.5*  --  8.9 8.8* 8.6* 8.8*  MG 1.7  --   --   --  2.1  --  1.8   GFR: Estimated Creatinine Clearance: 50.3 mL/min (by C-G formula based on  SCr of 0.68 mg/dL). Liver Function Tests: Recent Labs  Lab 09/02/22 2124 09/04/22 0125  AST 34 37  ALT 35 30  ALKPHOS 92 79  BILITOT 2.1* 1.7*  PROT 6.9 5.6*  ALBUMIN 3.1* 2.1*   No results for input(s): "LIPASE", "AMYLASE" in the last 168 hours. Recent Labs  Lab 09/02/22 2134  AMMONIA 32   Coagulation Profile: Recent Labs  Lab 09/02/22 2124  INR 1.2   Cardiac Enzymes: No results for input(s): "CKTOTAL", "CKMB", "CKMBINDEX", "TROPONINI" in the last 168 hours. BNP (last 3 results) No results for input(s): "PROBNP" in the last  8760 hours. HbA1C: No results for input(s): "HGBA1C" in the last 72 hours. CBG: Recent Labs  Lab 09/06/22 0754 09/06/22 1116 09/06/22 1614 09/06/22 2220 09/07/22 0719  GLUCAP 259* 332* 334* 301* 238*   Lipid Profile: No results for input(s): "CHOL", "HDL", "LDLCALC", "TRIG", "CHOLHDL", "LDLDIRECT" in the last 72 hours. Thyroid Function Tests: No results for input(s): "TSH", "T4TOTAL", "FREET4", "T3FREE", "THYROIDAB" in the last 72 hours. Anemia Panel: No results for input(s): "VITAMINB12", "FOLATE", "FERRITIN", "TIBC", "IRON", "RETICCTPCT" in the last 72 hours. Sepsis Labs: No results for input(s): "PROCALCITON", "LATICACIDVEN" in the last 168 hours.  Recent Results (from the past 240 hour(s))  Urine Culture     Status: Abnormal   Collection Time: 09/02/22 10:50 PM   Specimen: In/Out Cath Urine  Result Value Ref Range Status   Specimen Description   Final    IN/OUT CATH URINE Performed at [facility], [address], [place], [place] [address]    Special Requests   Final    NONE Performed at [facility], [address], [place], [place] [address]    Culture (A)  Final    >=100,000 COLONIES/mL KLEBSIELLA PNEUMONIAE Confirmed Extended Spectrum Beta-Lactamase Producer (ESBL).  In bloodstream infections from ESBL organisms, carbapenems are preferred over piperacillin/tazobactam. They are shown  to have a lower risk of mortality.    Report Status 09/06/2022 FINAL  Final   Organism ID, Bacteria KLEBSIELLA PNEUMONIAE (A)  Final      Susceptibility   Klebsiella pneumoniae - MIC*    AMPICILLIN >=32 RESISTANT Resistant     CEFAZOLIN >=64 RESISTANT Resistant     CEFEPIME >=32 RESISTANT Resistant     CEFTRIAXONE >=64 RESISTANT Resistant     CIPROFLOXACIN 2 RESISTANT Resistant     GENTAMICIN >=16 RESISTANT Resistant     IMIPENEM <=0.25 SENSITIVE Sensitive     NITROFURANTOIN 128 RESISTANT Resistant     TRIMETH/SULFA >=320 RESISTANT Resistant     AMPICILLIN/SULBACTAM >=32 RESISTANT Resistant     PIP/TAZO <=4 SENSITIVE Sensitive     * >=100,000 COLONIES/mL KLEBSIELLA PNEUMONIAE         Radiology Studies: No results found.         LOS: 4 days   Time spent= 35 mins    Saadiya Wilfong Joline Maxcy, MD [facility]  If 7PM-7AM, please contact night-coverage  09/07/2022, 8:37 AM

## 2022-09-07 NOTE — Inpatient Diabetes Management (Signed)
Inpatient Diabetes Program Recommendations  AACE/ADA: New Consensus Statement on Inpatient Glycemic Control (2015)  Target Ranges:  Prepandial:   less than 140 mg/dL      Peak postprandial:   less than 180 mg/dL (1-2 hours)      Critically ill patients:  140 - 180 mg/dL   Lab Results  Component Value Date   GLUCAP 379 (H) 09/07/2022   HGBA1C 8.8 (H) 08/03/2022    Review of Glycemic Control  Latest Reference Range & Units 09/07/22 07:19 09/07/22 11:23  Glucose-Capillary 70 - 99 mg/dL 119 (H) 147 (H)  (H): Data is abnormally high  Diabetes history: DM2 Outpatient Diabetes medications:  Levemir 30 units QHS Humalog TID (unsure of dose) Current orders for Inpatient glycemic control:  Semglee 25 units BID Novolog 0-15 units TID   Inpatient Diabetes Program Recommendations:    Please consider:   Novolog 3 units TID with meals if she eats at least 50%   Will continue to follow while inpatient.   Thank you, Dulce Sellar, MSN, CDCES Diabetes Coordinator Inpatient Diabetes Program [phone] (team pager from 8a-5p)

## 2022-09-08 DIAGNOSIS — N390 Urinary tract infection, site not specified: Secondary | ICD-10-CM | POA: Diagnosis not present

## 2022-09-08 LAB — MAGNESIUM: Magnesium: 1.8 mg/dL (ref 1.7–2.4)

## 2022-09-08 LAB — BASIC METABOLIC PANEL
Anion gap: 5 (ref 5–15)
BUN: 12 mg/dL (ref 8–23)
CO2: 25 mmol/L (ref 22–32)
Calcium: 8.8 mg/dL — ABNORMAL LOW (ref 8.9–10.3)
Chloride: 102 mmol/L (ref 98–111)
Creatinine, Ser: 0.73 mg/dL (ref 0.44–1.00)
GFR, Estimated: >60 mL/min (ref >60)
Glucose, Bld: 291 mg/dL — ABNORMAL HIGH (ref 70–99)
Potassium: 3.5 mmol/L (ref 3.5–5.1)
Sodium: 132 mmol/L — ABNORMAL LOW (ref 135–145)

## 2022-09-08 LAB — CBC
HCT: 31 % — ABNORMAL LOW (ref 36.0–46.0)
Hemoglobin: 9.9 g/dL — ABNORMAL LOW (ref 12.0–15.0)
MCH: 31.2 pg (ref 26.0–34.0)
MCHC: 31.9 g/dL (ref 30.0–36.0)
MCV: 97.8 fL (ref 80.0–100.0)
Platelets: 86 10*3/uL — ABNORMAL LOW (ref 150–400)
RBC: 3.17 MIL/uL — ABNORMAL LOW (ref 3.87–5.11)
RDW: 15.9 % — ABNORMAL HIGH (ref 11.5–15.5)
WBC: 3.8 10*3/uL — ABNORMAL LOW (ref 4.0–10.5)
nRBC: 0 % (ref 0.0–0.2)

## 2022-09-08 LAB — GLUCOSE, CAPILLARY
Glucose-Capillary: 384 mg/dL — ABNORMAL HIGH (ref 70–99)
Glucose-Capillary: 254 mg/dL — ABNORMAL HIGH (ref 70–99)
Glucose-Capillary: 192 mg/dL — ABNORMAL HIGH (ref 70–99)
Glucose-Capillary: 280 mg/dL — ABNORMAL HIGH (ref 70–99)

## 2022-09-08 MED ORDER — POTASSIUM CHLORIDE CRYS ER 20 MEQ PO TBCR
40.0000 meq | EXTENDED_RELEASE_TABLET | Freq: Once | ORAL | Status: AC
  Administered 2022-09-08: 40 meq via ORAL
  Filled 2022-09-08: qty 2

## 2022-09-08 NOTE — Progress Notes (Signed)
Physical Therapy Treatment Patient Details Name: [name] MRN: [id number] DOB: 03-Jun-1945 Today's Date: 09/08/2022   History of Present Illness 77 yo female presenting 4/26 with AMS. Of note, pt recently admitted 4/14-4/16 due to a fall at home which caused a subdural hematoma, subarachnoid hemorrhage and multiple lumbar spinal vertebral fractures. Work up this admission shows UTI. PMH includes NASH cirrhosis, diastolic CHF, DMII, HLD, anxiety, lumbar fusion, CVA.    PT Comments    Pt was received in supine and agreeable to session with family present and supportive. Pt making good progress towards functional mobility goals. Pt with improved command following and responses this session. Pt able to tolerate increased gait distance this session with supervision. Pt requiring cues for safety due to attempting to leave RW to the side and walk to the chair. Anticipate pt and family will be able to manage pt's mobility needs at home once medically ready for discharge.    Recommendations for follow up therapy are one component of a multi-disciplinary discharge planning process, led by the attending physician.  Recommendations may be updated based on patient status, additional functional criteria and insurance authorization.     Assistance Recommended at Discharge Frequent or constant Supervision/Assistance  Patient can return home with the following A little help with bathing/dressing/bathroom;Assistance with cooking/housework;Assist for transportation;Help with stairs or ramp for entrance   Equipment Recommendations  None recommended by PT    Recommendations for Other Services       Precautions / Restrictions Precautions Precautions: Fall Precaution Comments: history of multiple falls Restrictions Weight Bearing Restrictions: No     Mobility  Bed Mobility Overal bed mobility: Needs Assistance Bed Mobility: Supine to Sit     Supine to sit: Supervision, HOB elevated      General bed mobility comments: increased time    Transfers Overall transfer level: Needs assistance Equipment used: Rolling walker (2 wheels) Transfers: Sit to/from Stand Sit to Stand: Supervision           General transfer comment: from EOB with supervision for safety    Ambulation/Gait Ambulation/Gait assistance: Supervision Gait Distance (Feet): 250 Feet Assistive device: Rolling walker (2 wheels) Gait Pattern/deviations: Step-through pattern, Drifts right/left       General Gait Details: Steady gait with light support on RW. Cues for upright posture, RW proximity, and obstacle avoidance.       Balance Overall balance assessment: Needs assistance Sitting-balance support: No upper extremity supported, Feet supported Sitting balance-Leahy Scale: Good Sitting balance - Comments: sitting EOB   Standing balance support: Bilateral upper extremity supported, During functional activity Standing balance-Leahy Scale: Fair Standing balance comment: with RW support                            Cognition Arousal/Alertness: Awake/alert Behavior During Therapy: WFL for tasks assessed/performed Overall Cognitive Status: Impaired/Different from baseline                                 General Comments: Improved command following and appropriate responses this session        Exercises      General Comments        Pertinent Vitals/Pain Pain Assessment Pain Assessment: No/denies pain     PT Goals (current goals can now be found in the care plan section) Acute Rehab PT Goals Patient Stated Goal: to return home PT Goal Formulation: With patient  Time For Goal Achievement: 09/18/22 Potential to Achieve Goals: Good Progress towards PT goals: Progressing toward goals    Frequency    Min 3X/week      PT Plan Current plan remains appropriate       AM-PAC PT "6 Clicks" Mobility   Outcome Measure  Help needed turning from your back to  your side while in a flat bed without using bedrails?: None Help needed moving from lying on your back to sitting on the side of a flat bed without using bedrails?: None Help needed moving to and from a bed to a chair (including a wheelchair)?: A Little Help needed standing up from a chair using your arms (e.g., wheelchair or bedside chair)?: A Little Help needed to walk in hospital room?: A Little Help needed climbing 3-5 steps with a railing? : A Little 6 Click Score: 20    End of Session Equipment Utilized During Treatment: Gait belt Activity Tolerance: Patient tolerated treatment well Patient left: with call bell/phone within reach;with family/visitor present;in chair Nurse Communication: Mobility status PT Visit Diagnosis: Other abnormalities of gait and mobility (R26.89);History of falling (Z91.81)     Time: 2440-1027 PT Time Calculation (min) (ACUTE ONLY): 15 min  Charges:  $Gait Training: 8-22 mins                     Johny Shock, PTA Acute Rehabilitation Services Secure Chat Preferred  Office:([phone]    Johny Shock 09/08/2022, 3:07 PM

## 2022-09-08 NOTE — Progress Notes (Signed)
PROGRESS NOTE    [name]  ZOX:[id number] DOB: November 03, 1945 DOA: 09/02/2022 PCP: Frederica Kuster, MD   Brief Narrative:  77 year old with history of Elita Boone cirrhosis status post TIPS, diastolic CHF, chronic anemia, DM2, anxiety admitted for altered mental status.  She was admitted here recently from 4/14-4/16/24 for subdural/subarachnoid hemorrhage secondary to mechanical fall.  During that admission urine cultures grew ESBL Klebsiella treated with fosfomycin.  Patient now returns with fevers, chills and confusion.  CT of the head showed improvement in previous subdural hematoma.  Patient started on meropenem  09/08/2022: Patient seen alongside patient's husband and son.  Also discussed with patient's nurse.  Patient is awaiting to complete course of antibiotics.  Patient has improved significantly.  Hopefully, patient will be discharged back home tomorrow.   Assessment & Plan:  Principal Problem:   Acute lower UTI Active Problems:   DM2 (diabetes mellitus, type 2) (HCC)   Hypokalemia   Liver cirrhosis secondary to NASH (HCC)   Thrombocytopenia (HCC)   Chronic diastolic CHF (congestive heart failure) (HCC)   Wound of left leg   Acute metabolic encephalopathy     Assessment and Plan: * Acute lower UTI Urine cultures are consistent with Klebsiella ESBL sensitive to Zosyn and imipenem. Initially patient was on Zosyn and thereafter switched to meropenem.  Today is day 6, tomorrow last day to complete total 7-day course. 09/08/2022: Complete course of antibiotics.  Likely discharge back home tomorrow.  Acute metabolic encephalopathy -Likely secondary to UTI.  CT of the head showed previously improved subdural hematoma.  Clinically improving. 09/08/2022: Resolved significantly.  Hypokalemia As needed repletion 09/08/2022: Potassium is 3.5 today.  K-Dur 40 mEq p.o. x 1 dose.  Renal panel in the morning.  Magnesium level in the morning.  Continue to monitor electrolytes.  Liver  cirrhosis secondary to NASH (HCC) -No signs of decompensation.   -Continue medical therapy with lactulose, Xifaxan, Lasix.    Chronic diastolic CHF (congestive heart failure) (HCC) - 2023 echocardiogram with preserved LV systolic function, EF 60 to 65%. No LVH, RV systolic function preserved. RVSP 30,5 mmHg. La with mild to moderate dilatation. Mild MR and mild MR stenosis. Mild TR  -Overall appears to be euvolemic.  Continue daily Lasix, rifaximin 09/08/2022: Compensated.  DM2 (diabetes mellitus, type 2) (HCC) Uncontrolled secondary to hyperglycemia.  Recent A1c 8.8.  Semglee 25 units twice daily.  Sliding scale   Thrombocytopenia (HCC)/pancytopenia: -Likely secondary to chronic liver disease.    PT-Home health   DVT prophylaxis: Place and maintain sequential compression device Start: 09/06/22 1854 SCDs Start: 09/03/22 1204 Code Status: Full code Family Communication: Significant other at bedside Status is: Inpatient Continue IV meropenem for 1 more day to complete total 7-day course.   Pressure Injury 09/03/22 Sacrum Right;Medial Stage 2 -  Partial thickness loss of dermis presenting as a shallow open injury with a red, pink wound bed without slough. (Active)  09/03/22 1151  Location: Sacrum  Location Orientation: Right;Medial  Staging: Stage 2 -  Partial thickness loss of dermis presenting as a shallow open injury with a red, pink wound bed without slough.  Wound Description (Comments):   Present on Admission: Yes     Diet Orders (From admission, onward)     Start     Ordered   09/03/22 1205  Diet Carb Modified Fluid consistency: Thin; Room service appropriate? Yes  Diet effective now       Comments: And low-sodium  Question Answer Comment  Diet-HS Snack? Nothing  Calorie Level Medium 1600-2000   Fluid consistency: Thin   Room service appropriate? Yes      09/03/22 1206            Subjective:  Overall doing better.  No new  complaints  Examination:  General exam: Appears calm and comfortable  Respiratory system: Clear to auscultation.  Cardiovascular system: S1 & S2 heard, systolic murmur. Gastrointestinal system: Obese, soft and nontender.   Central nervous system: Alert and oriented.  Patient moves all extremities.. Extremities: No leg edema..  Objective: Vitals:   09/07/22 2020 09/08/22 0420 09/08/22 0525 09/08/22 0841  BP: (!) 110/49  (!) 122/59 (!) 113/57  Pulse: 80  69 75  Resp: 17  17 20   Temp: 98.6 F (37 C)  98.7 F (37.1 C) 98.5 F (36.9 C)  TempSrc: Oral   Oral  SpO2: 96%  95% 96%  Weight:  59.9 kg      Intake/Output Summary (Last 24 hours) at 09/08/2022 1552 Last data filed at 09/08/2022 0858 Gross per 24 hour  Intake 720 ml  Output --  Net 720 ml    Filed Weights   09/04/22 0500 09/07/22 0407 09/08/22 0420  Weight: 56.1 kg 58.8 kg 59.9 kg    Scheduled Meds:  artificial tears   Both Eyes QHS   cyanocobalamin  1,000 mcg Oral Daily   furosemide  40 mg Oral Daily   insulin aspart  0-15 Units Subcutaneous TID WC   insulin aspart  3 Units Subcutaneous TID WC   insulin glargine-yfgn  25 Units Subcutaneous BID   lactulose  20 g Oral Daily   nystatin cream  1 Application Topical BID   pantoprazole  40 mg Oral Daily   rifaximin  550 mg Oral BID   sodium chloride flush  3 mL Intravenous Q12H   Continuous Infusions:  meropenem (MERREM) IV 1 g (09/08/22 1359)    Nutritional status     Body mass index is 23.39 kg/m.  Data Reviewed:   CBC: Recent Labs  Lab 09/02/22 2124 09/04/22 0125 09/06/22 0437 09/07/22 0146 09/08/22 0444  WBC 6.3 4.0 4.0 3.8* 3.8*  NEUTROABS  --   --   --  2.7  --   HGB 12.1 10.9* 9.8* 10.0* 9.9*  HCT 36.4 33.1* 29.0* 30.8* 31.0*  MCV 94.1 95.1 94.5 96.3 97.8  PLT 108* 100* 84* 83* 86*    Basic Metabolic Panel: Recent Labs  Lab 09/02/22 2254 09/03/22 1257 09/04/22 0125 09/05/22 0129 09/06/22 0437 09/07/22 0146 09/08/22 0444  NA  --     < > 135 131* 132* 131* 132*  K  --    < > 3.0* 3.6 3.6 3.3* 3.5  CL  --    < > 105 103 104 100 102  CO2  --    < > 22 24 23 24 25   GLUCOSE  --    < > 319* 341* 310* 305* 291*  BUN  --    < > 15 15 15 13 12   CREATININE  --    < > 0.84 0.89 0.66 0.68 0.73  CALCIUM  --    < > 8.9 8.8* 8.6* 8.8* 8.8*  MG 1.7  --   --  2.1  --  1.8 1.8   < > = values in this interval not displayed.    GFR: Estimated Creatinine Clearance: 50.3 mL/min (by C-G formula based on SCr of 0.73 mg/dL). Liver Function Tests: Recent Labs  Lab 09/02/22 2124 09/04/22  0125  AST 34 37  ALT 35 30  ALKPHOS 92 79  BILITOT 2.1* 1.7*  PROT 6.9 5.6*  ALBUMIN 3.1* 2.1*    No results for input(s): "LIPASE", "AMYLASE" in the last 168 hours. Recent Labs  Lab 09/02/22 2134  AMMONIA 32    Coagulation Profile: Recent Labs  Lab 09/02/22 2124  INR 1.2    Cardiac Enzymes: No results for input(s): "CKTOTAL", "CKMB", "CKMBINDEX", "TROPONINI" in the last 168 hours. BNP (last 3 results) No results for input(s): "PROBNP" in the last 8760 hours. HbA1C: No results for input(s): "HGBA1C" in the last 72 hours. CBG: Recent Labs  Lab 09/07/22 1123 09/07/22 1605 09/07/22 2027 09/08/22 0839 09/08/22 1128  GLUCAP 379* 465* 369* 384* 254*    Lipid Profile: No results for input(s): "CHOL", "HDL", "LDLCALC", "TRIG", "CHOLHDL", "LDLDIRECT" in the last 72 hours. Thyroid Function Tests: No results for input(s): "TSH", "T4TOTAL", "FREET4", "T3FREE", "THYROIDAB" in the last 72 hours. Anemia Panel: No results for input(s): "VITAMINB12", "FOLATE", "FERRITIN", "TIBC", "IRON", "RETICCTPCT" in the last 72 hours. Sepsis Labs: No results for input(s): "PROCALCITON", "LATICACIDVEN" in the last 168 hours.  Recent Results (from the past 240 hour(s))  Urine Culture     Status: Abnormal   Collection Time: 09/02/22 10:50 PM   Specimen: In/Out Cath Urine  Result Value Ref Range Status   Specimen Description   Final    IN/OUT CATH  URINE Performed at [facility], [address] [place], [place] [address]    Special Requests   Final    NONE Performed at [facility], [address] [place], [place] [address]    Culture (A)  Final    >=100,000 COLONIES/mL KLEBSIELLA PNEUMONIAE Confirmed Extended Spectrum Beta-Lactamase Producer (ESBL).  In bloodstream infections from ESBL organisms, carbapenems are preferred over piperacillin/tazobactam. They are shown to have a lower risk of mortality.    Report Status 09/06/2022 FINAL  Final   Organism ID, Bacteria KLEBSIELLA PNEUMONIAE (A)  Final      Susceptibility   Klebsiella pneumoniae - MIC*    AMPICILLIN >=32 RESISTANT Resistant     CEFAZOLIN >=64 RESISTANT Resistant     CEFEPIME >=32 RESISTANT Resistant     CEFTRIAXONE >=64 RESISTANT Resistant     CIPROFLOXACIN 2 RESISTANT Resistant     GENTAMICIN >=16 RESISTANT Resistant     IMIPENEM <=0.25 SENSITIVE Sensitive     NITROFURANTOIN 128 RESISTANT Resistant     TRIMETH/SULFA >=320 RESISTANT Resistant     AMPICILLIN/SULBACTAM >=32 RESISTANT Resistant     PIP/TAZO <=4 SENSITIVE Sensitive     * >=100,000 COLONIES/mL KLEBSIELLA PNEUMONIAE         Radiology Studies: No results found.         LOS: 5 days   Time spent= 35 mins    Barnetta Chapel, MD [facility]  If 7PM-7AM, please contact night-coverage  09/08/2022, 3:52 PM

## 2022-09-08 NOTE — Inpatient Diabetes Management (Signed)
Inpatient Diabetes Program Recommendations  AACE/ADA: New Consensus Statement on Inpatient Glycemic Control (2015)  Target Ranges:  Prepandial:   less than 140 mg/dL      Peak postprandial:   less than 180 mg/dL (1-2 hours)      Critically ill patients:  140 - 180 mg/dL   Lab Results  Component Value Date   GLUCAP 384 (H) 09/08/2022   HGBA1C 8.8 (H) 08/03/2022    Latest Reference Range & Units 09/07/22 07:19 09/07/22 11:23 09/07/22 16:05 09/07/22 20:27 09/08/22 08:39  Glucose-Capillary 70 - 99 mg/dL 540 (H) 981 (H) 191 (H) 369 (H) 384 (H)  (H): Data is abnormally high Review of Glycemic Control  Diabetes history: type 2 Outpatient Diabetes medications: Levemir 30 units daily, Humalog (dose?) Current orders for Inpatient glycemic control: Semglee 25 units BID, Novolog 0-15 units correction scale TID, Novolog 3 units TID  Inpatient Diabetes Program Recommendations:   Noted that patient's CBGs have been greater than 300 mg/dl.  Recommend increasing Semglee to 28 units BID, continue Novolog 0-15 units correction scale TID, add Novolog 0-5 units HS scale, increase Novolog meal coverage to 5 units TID.   Smith Mince RN BSN CDE Diabetes Coordinator Pager: [phone]  8am-5pm

## 2022-09-09 DIAGNOSIS — N390 Urinary tract infection, site not specified: Secondary | ICD-10-CM | POA: Diagnosis not present

## 2022-09-09 LAB — GLUCOSE, CAPILLARY
Glucose-Capillary: 287 mg/dL — ABNORMAL HIGH (ref 70–99)
Glucose-Capillary: 189 mg/dL — ABNORMAL HIGH (ref 70–99)

## 2022-09-09 LAB — RENAL FUNCTION PANEL
Albumin: 2 g/dL — ABNORMAL LOW (ref 3.5–5.0)
Anion gap: 6 (ref 5–15)
BUN: 11 mg/dL (ref 8–23)
CO2: 27 mmol/L (ref 22–32)
Calcium: 9.4 mg/dL (ref 8.9–10.3)
Chloride: 104 mmol/L (ref 98–111)
Creatinine, Ser: 0.75 mg/dL (ref 0.44–1.00)
GFR, Estimated: >60 mL/min (ref >60)
Glucose, Bld: 165 mg/dL — ABNORMAL HIGH (ref 70–99)
Phosphorus: 2.7 mg/dL (ref 2.5–4.6)
Potassium: 3.5 mmol/L (ref 3.5–5.1)
Sodium: 137 mmol/L (ref 135–145)

## 2022-09-09 LAB — MAGNESIUM: Magnesium: 1.9 mg/dL (ref 1.7–2.4)

## 2022-09-09 MED ORDER — BASAGLAR KWIKPEN 100 UNIT/ML ~~LOC~~ SOPN: 30.0000 [IU] | PEN_INJECTOR | Freq: Every day | SUBCUTANEOUS | 1 refills | Status: DC

## 2022-09-09 MED ORDER — POTASSIUM CHLORIDE CRYS ER 20 MEQ PO TBCR: 20.0000 meq | EXTENDED_RELEASE_TABLET | Freq: Every day | ORAL | 0 refills | Status: DC

## 2022-09-09 MED ORDER — ARTIFICIAL TEARS OPHTHALMIC OINT: TOPICAL_OINTMENT | Freq: Every day | OPHTHALMIC | 0 refills | Status: DC

## 2022-09-09 MED ORDER — SENNOSIDES-DOCUSATE SODIUM 8.6-50 MG PO TABS: 1.0000 | ORAL_TABLET | Freq: Every evening | ORAL | 0 refills | Status: AC | PRN

## 2022-09-09 NOTE — TOC Progression Note (Addendum)
Transition of Care [facility]) - Progression Note    Patient Details  Name: [name] MRN: [id number] Date of Birth: June 26, 1945  Transition of Care [facility]) CM/SW Contact  Lockie Pares, RN Phone Number: 09/09/2022, 1:21 PM  Clinical Narrative:     Sherron Monday to Marylene Land at [facility] regarding patient. She is active with them for PT < OT SLP< RN. Orders in for [facility] in preparation for DC TOC will continue to follow for any further needs.    1555 Patient DC home with [facility] aware.   Expected Discharge Plan and Services  Home with home heatlh                             Time DME Agency Contacted: [phone]   HH Arranged: OT, PT, Speech Therapy, Nurse's Aide   Date HH Agency Contacted: 09/09/22 Time HH Agency Contacted: 1321 Representative spoke with at [facility] Agency: Marylene Land   Social Determinants of Health (SDOH) Interventions SDOH Screenings   Food Insecurity: No Food Insecurity (08/24/2022)  Housing: Low Risk  (08/21/2022)  Transportation Needs: No Transportation Needs (08/24/2022)  Utilities: Not At Risk (08/21/2022)  Alcohol Screen: Low Risk  (06/04/2018)  Depression (PHQ2-9): Low Risk  (08/15/2022)  Financial Resource Strain: Low Risk  (05/29/2017)  Physical Activity: Inactive (05/29/2017)  Social Connections: Moderately Integrated (05/29/2017)  Stress: No Stress Concern Present (05/29/2017)  Tobacco Use: Low Risk  (09/02/2022)    Readmission Risk Interventions    07/03/2022   12:13 PM  Readmission Risk Prevention Plan  Transportation Screening Complete  Medication Review (RN Care Manager) Not Complete  PCP or Specialist appointment within 3-5 days of discharge Complete  SW Recovery Care/Counseling Consult Complete  Palliative Care Screening Not Complete  Comments patient will go to skilled nursing  Skilled Nursing Facility Complete

## 2022-09-09 NOTE — Discharge Summary (Signed)
Physician Discharge Summary  Patient ID: [name] MRN: [id number] DOB/AGE: 10/24/1945 77 y.o.  Admit date: 09/02/2022 Discharge date: 09/09/2022  Admission Diagnoses:  Discharge Diagnoses:  Principal Problem:   Acute lower UTI Active Problems:   DM2 (diabetes mellitus, type 2) (HCC)   Hypokalemia   Liver cirrhosis secondary to NASH (HCC)   Thrombocytopenia (HCC)   Chronic diastolic CHF (congestive heart failure) (HCC)   Wound of left leg   Acute metabolic encephalopathy   Discharged Condition: stable  Hospital Course: Patient is a 77 year old with history of Elita Boone cirrhosis status post TIPS, diastolic CHF, chronic anemia, DM2, anxiety admitted for altered mental status.  She was admitted here recently from 4/14-4/16/24 for subdural/subarachnoid hemorrhage secondary to mechanical fall.  During that admission urine cultures grew ESBL Klebsiella treated with fosfomycin.  Patient now returns with fevers, chills and confusion.  CT of the head showed improvement in previous subdural hematoma.  Urine culture grew Klebsiella pneumonia ESBL.  Patient was treated with 5-day course of IV meropenem.  Patient has completed course of antibiotics.  Altered mentation has improved.  Patient will be discharged back home.  Patient will follow-up with a primary care provider within 1 week of discharge.     Acute lower UTI -Urine culture grew Klebsiella ESBL, sensitive to Zosyn and imipenem. -Initially patient was on Zosyn and thereafter switched to meropenem.   -Patient has completed a course of IV meropenem.  Altered mental show has resolved   Acute metabolic encephalopathy -Likely secondary to UTI. -CT of the head showed previously improved subdural hematoma. -Resolved.    Hypokalemia -Continue to monitor renal function and electrolytes.   -Patient will be discharged on 10-day course of K-Dur 20 mEq p.o. once daily. -PCP to check BMP in 3 days time.   Liver cirrhosis secondary to  NASH (HCC) -No signs of decompensation.   -Continue medical therapy with lactulose, Xifaxan, Lasix.     Chronic diastolic CHF (congestive heart failure) (HCC) - 2023 echocardiogram with preserved LV systolic function, EF 60 to 65%. No LVH, RV systolic function preserved. RVSP 30,5 mmHg. La with mild to moderate dilatation. Mild MR and mild MR stenosis. Mild TR  -Overall appears to be euvolemic.  Continue daily Lasix, rifaximin 09/08/2022: Compensated.   DM2 (diabetes mellitus, type 2) (HCC) Uncontrolled secondary to hyperglycemia.  Recent A1c 8.8.  Semglee 25 units twice daily.  Sliding scale     Thrombocytopenia (HCC)/pancytopenia: -Likely secondary to chronic liver disease.    PT-Home health    Consults: None  Significant Diagnostic Studies:  Urine culture: Grew Klebsiella pneumonia ESBL.  CT head revealed: Resolution of previously seen subdural hematoma along the falx.   Persistent subdural hematoma is noted along the left tentorium. The overall density is decreased and the thickness has decreased as well.   No new focal hemorrhage is noted.  Chest x-ray reviewed: Vascular congestion and edema with developing right basilar infiltrate.  Treatments:  IV meropenem.  Discharge Exam: Blood pressure (!) 118/56, pulse 73, temperature 98.6 F (37 C), temperature source Oral, resp. rate 18, weight 61.5 kg, SpO2 99 %.   Disposition: Discharge disposition: 01-Home or Self Care       Discharge Instructions     Diet - low sodium heart healthy   Complete by: As directed    Diet Carb Modified   Complete by: As directed    Discharge wound care:   Complete by: As directed    Continue current wound care plan.  Increase activity slowly   Complete by: As directed       Allergies as of 09/09/2022       Reactions   Kiwi Extract Anaphylaxis   Gabapentin Nausea And Vomiting   Tdap [tetanus-diphth-acell Pertussis] Swelling, Other (See Comments)   Swelling at injection  site, gets very hot   Latex Itching, Dermatitis, Rash   Statins Other (See Comments)   RHABDOMYOLYSIS   Tramadol Nausea And Vomiting        Medication List     STOP taking these medications    Biotin 52841 MCG Tabs   Levemir FlexPen 100 UNIT/ML FlexPen Generic drug: insulin detemir   SYSTANE OP   Tresiba FlexTouch 100 UNIT/ML FlexTouch Pen Generic drug: insulin degludec       TAKE these medications    acetaminophen 500 MG tablet Commonly known as: TYLENOL Take 1,000 mg by mouth every 6 (six) hours as needed for moderate pain.   artificial tears Oint ophthalmic ointment Commonly known as: LACRILUBE Place into both eyes at bedtime.   B-D ULTRAFINE III SHORT PEN 31G X 8 MM Misc Generic drug: Insulin Pen Needle Use to give Insulin injections daily. Dx: E11.65   Basaglar KwikPen 100 UNIT/ML Inject 30 Units into the skin daily.   CALCIUM 600 + D PO Take 1 tablet by mouth in the morning and at bedtime.   cyanocobalamin 1000 MCG tablet Commonly known as: VITAMIN B12 Take 1,000 mcg by mouth daily.   ezetimibe 10 MG tablet Commonly known as: ZETIA Take 1 tablet (10 mg total) by mouth daily.   fish oil-omega-3 fatty acids 1000 MG capsule Take 1 g by mouth daily.   furosemide 20 MG tablet Commonly known as: LASIX Take 40 mg by mouth daily.   glucose blood test strip One Touch Ultra Blue test strips. Use to test blood sugar three times daily. Dx: E11.65   insulin lispro 100 UNIT/ML injection Commonly known as: HUMALOG Inject 0-0.12 mLs (0-12 Units total) into the skin See admin instructions. Before each meal 3 times a day, 140-199 - 2 units, 200-250 - 4 units, 251-299 - 6 units,  300-349 - 8 units,  350 or above 10 units.   INSULIN SYRINGE 1CC/31GX5/16" 31G X 5/16" 1 ML Misc USE AS DIRECTED TO INJECT LEVIMIR   lactulose 10 GM/15ML solution Commonly known as: CHRONULAC TAKE 30 ML BY MOUTH EVERY DAY What changed: See the new instructions.   MAGNESIUM  PO Take 1 tablet by mouth at bedtime.   nystatin cream Commonly known as: MYCOSTATIN Apply 1 Application topically 2 (two) times daily.   pantoprazole 40 MG tablet Commonly known as: PROTONIX TAKE 1 TABLET(40 MG) BY MOUTH DAILY What changed:  how much to take how to take this when to take this additional instructions   potassium chloride SA 20 MEQ tablet Commonly known as: KLOR-CON M Take 1 tablet (20 mEq total) by mouth daily for 10 days.   rifaximin 550 MG Tabs tablet Commonly known as: XIFAXAN Take 550 mg by mouth 2 (two) times daily.   senna-docusate 8.6-50 MG tablet Commonly known as: Senokot-S Take 1 tablet by mouth at bedtime as needed for moderate constipation.   Unilet ComforTouch Lancet Misc 1 Device by Does not apply route in the morning and at bedtime.   Vitamin D-3 25 MCG (1000 UT) Caps Take 1,000 Units by mouth daily.               Discharge Care Instructions  (From admission,  onward)           Start     Ordered   09/09/22 0000  Discharge wound care:       Comments: Continue current wound care plan.   09/09/22 1344            Follow-up Information     [facility] Follow up.   Specialty: Home Health Services Why: Someone from [facility] will call to arrange resumption of your services. Contact information: [address] [place] [address] [phone]                 Time spent: 35 minutes.  SignedBarnetta Chapel 09/09/2022, 2:13 PM

## 2022-09-09 NOTE — Plan of Care (Signed)

## 2022-09-09 NOTE — Progress Notes (Signed)
Mobility Specialist Progress Note   09/09/22 0950  Mobility  Activity Ambulated with assistance in hallway  Level of Assistance Standby assist, set-up cues, supervision of patient - no hands on  Assistive Device Front wheel walker  Distance Ambulated (ft) 380 ft  Range of Motion/Exercises Active;All extremities  Activity Response Tolerated well   Patient received in supine and agreeable to participate. Ambulated supervision level with slow steady gait. Returned to room without complaint or incident. Was left in recliner with all needs met, call bell in reach.   [place] Tryson Lumley, BS EXP Mobility Specialist Please contact via SecureChat or Rehab office at [phone]

## 2022-09-12 ENCOUNTER — Telehealth: Payer: Self-pay

## 2022-09-12 NOTE — Transitions of Care (Post Inpatient/ED Visit) (Signed)
09/12/2022  Name: [name] MRN: [id number] DOB: 10-10-1945  Today's TOC FU Call Status: Today's TOC FU Call Status:: Successful TOC FU Call Competed TOC FU Call Complete Date: 09/12/22  Transition Care Management Follow-up Telephone Call Date of Discharge: 09/09/22 Discharge Facility: [facility]) Type of Discharge: Inpatient Admission Primary Inpatient Discharge Diagnosis:: UTI How have you been since you were released from the hospital?: Better (I am feeling better but I am tired.  Denies any falls.  States she is drinking enough fluids) Any questions or concerns?: Yes (States she does not have the Lispro(humalog) insulin and her CBG was 296 this morning) Patient Questions/Concerns:: States she does not have the Lispro(humalog) insulin and her CBG was 296 this morning Patient Questions/Concerns Addressed: Notified Provider of Patient Questions/Concerns  Items Reviewed: Did you receive and understand the discharge instructions provided?: Yes Medications obtained,verified, and reconciled?: No Medications Not Reviewed Reasons:: Notified Provider of Medication Management Concern (decline full medication reconcilation.  Messaged provider of pts need for Lispo insulin) Any new allergies since your discharge?: No Dietary orders reviewed?: Yes Type of Diet Ordered:: low sodium heart healthy Do you have support at home?: Yes People in Home: child(ren), adult, spouse Name of Support/Comfort Primary Source: daughter [name], husband [name]  Medications Reviewed Today: Medications Reviewed Today     Reviewed by Yetta Glassman, RN (Registered Nurse) on 09/12/22 at 1121  Med List Status: <None>   Medication Order Taking? Sig Documenting Provider Last Dose Status Informant  acetaminophen (TYLENOL) 500 MG tablet [id number] No Take 1,000 mg by mouth every 6 (six) hours as needed for moderate pain. [provider] 09/02/2022 Active Pharmacy Records, Child           Med  Note Normand Sloop, JASMINE E   Wed Aug 03, 2022  2:14 PM)    artificial tears (LACRILUBE) OINT ophthalmic ointment [id number]  Place into both eyes at bedtime. Barnetta Chapel, MD  Active   Calcium Carb-Cholecalciferol (CALCIUM 600 + D PO) [id number] No Take 1 tablet by mouth in the morning and at bedtime. [provider] 09/02/2022 Active Pharmacy Records, Child  Cholecalciferol (VITAMIN D-3) 25 MCG (1000 UT) CAPS [id number] No Take 1,000 Units by mouth daily. [provider] 09/02/2022 Active Pharmacy Records, Child  cyanocobalamin (VITAMIN B12) 1000 MCG tablet [id number] No Take 1,000 mcg by mouth daily. [provider] 09/02/2022 Active Child, Pharmacy Records  ezetimibe (ZETIA) 10 MG tablet [id number] No Take 1 tablet (10 mg total) by mouth daily. Frederica Kuster, MD 09/02/2022 Active Pharmacy Records, Child  fish oil-omega-3 fatty acids 1000 MG capsule [id number] No Take 1 g by mouth daily. [provider] 09/02/2022 Active Pharmacy Records, Child  furosemide (LASIX) 20 MG tablet [id number] No Take 40 mg by mouth daily. [provider] 09/02/2022 Active Pharmacy Records, Child  glucose blood test strip [id number] No One Touch Ultra Blue test strips. Use to test blood sugar three times daily. Dx: E11.65 Frederica Kuster, MD 08/21/2022 Active Pharmacy Records, Child  Insulin Glargine [facility]) 100 UNIT/ML [id number]  Inject 30 Units into the skin daily. Berton Mount I, MD  Active   insulin lispro (HUMALOG) 100 UNIT/ML injection [id number] No Inject 0-0.12 mLs (0-12 Units total) into the skin See admin instructions. Before each meal 3 times a day, 140-199 - 2 units, 200-250 - 4 units, 251-299 - 6 units,  300-349 - 8 units,  350 or above 10 units.  Patient not taking: Reported on 09/03/2022  Leroy Sea, MD Not Taking Active Pharmacy Records, Child  Insulin Pen Needle (B-D ULTRAFINE III SHORT PEN) 31G X 8 MM MISC [id number] No Use to give Insulin  injections daily. Dx: E11.65 Frederica Kuster, MD 08/21/2022 Active Pharmacy Records, Child  Insulin Syringe-Needle U-100 (INSULIN SYRINGE 1CC/31GX5/16") 31G X 5/16" 1 ML MISC [id number] No USE AS DIRECTED TO INJECT Cristy Folks, MD 08/21/2022 Active Pharmacy Records, Child  lactulose (CHRONULAC) 10 GM/15ML solution [id number] No TAKE 30 ML BY MOUTH EVERY DAY  Patient taking differently: Take 20 g by mouth daily.   Lynann Bologna, MD 09/02/2022 Active Child, Pharmacy Records  Lancets [facility]) [place] [id number]  1 Device by Does not apply route in the morning and at bedtime. Ngetich, Donalee Citrin, NP  Active Child, Pharmacy Records  MAGNESIUM PO [id number] No Take 1 tablet by mouth at bedtime. [provider] 09/01/2022 Active Pharmacy Records, Child  nystatin cream (MYCOSTATIN) [id number] No Apply 1 Application topically 2 (two) times daily. [provider] 09/02/2022 Active Child, Pharmacy Records  pantoprazole (PROTONIX) 40 MG tablet [id number] No TAKE 1 TABLET(40 MG) BY MOUTH DAILY  Patient taking differently: Take 40 mg by mouth daily.   Frederica Kuster, MD 09/02/2022 Active Pharmacy Records, Child           Med Note Normand Sloop, Jed Limerick   Wed Aug 03, 2022  2:17 PM)    potassium chloride SA (KLOR-CON M) 20 MEQ tablet [id number]  Take 1 tablet (20 mEq total) by mouth daily for 10 days. Barnetta Chapel, MD  Active   rifaximin [facility]) 550 MG TABS tablet [id number] No Take 550 mg by mouth 2 (two) times daily. [provider] 09/02/2022 Active Pharmacy Records, Child           Med Note (CRUTHIS, CHLOE C   Sat Sep 03, 2022  2:36 PM) No fill hx found for this medication. Daughter is adamant she is taking this medication BID.   senna-docusate (SENOKOT-S) 8.6-50 MG tablet [id number]  Take 1 tablet by mouth at bedtime as needed for moderate constipation. Barnetta Chapel, MD  Active             Home Care and Equipment/Supplies: Were Home Health  Services Ordered?: Yes Name of Home Health Agency:: [facility] Has Agency set up a time to come to your home?: No (Pt to resume care.  Daughter plans to call if not contacted) Any new equipment or medical supplies ordered?: NA  Functional Questionnaire: Do you need assistance with bathing/showering or dressing?: Yes (familly assists as needed) Do you need assistance with meal preparation?: Yes (familly assists as needed) Do you need assistance with eating?: No Do you have difficulty maintaining continence: No Do you need assistance with getting out of bed/getting out of a chair/moving?: No Do you have difficulty managing or taking your medications?: No  Follow up appointments reviewed: PCP Follow-up appointment confirmed?: Yes Date of PCP follow-up appointment?: 09/15/22 Follow-up Provider: Abbey Chatters Specialist [facility] Follow-up appointment confirmed?: Yes Date of Specialist follow-up appointment?: 09/16/22 Follow-Up Specialty Provider:: Wound Care Do you need transportation to your follow-up appointment?: No Do you understand care options if your condition(s) worsen?: Yes-patient verbalized understanding  SDOH Interventions Today    Flowsheet Row Most Recent Value  SDOH Interventions   Food Insecurity Interventions Intervention Not Indicated  Transportation Interventions Intervention Not Indicated      TOC Interventions Today    Flowsheet Row Most Recent Value  TOC Interventions  TOC Interventions Discussed/Reviewed TOC Interventions Discussed, Arranged PCP follow up within 7 days/Care Guide scheduled, Post discharge activity limitations per provider, S/S of infection, Contacted provider for patient needs       Interventions Today    Flowsheet Row Most Recent Value  Chronic Disease   Chronic disease during today's visit Other  [UTI]  General Interventions   General Interventions Discussed/Reviewed General Interventions Discussed, Doctor Visits,  Communication with, Referral to Nurse  Doctor Visits Discussed/Reviewed Doctor Visits Discussed, PCP  PCP/Specialist Visits Compliance with follow-up visit  Communication with PCP/Specialists, RN  [communicated need for lispro insulin. Scheduled with RNCM for ongoing care coordination needs]  Education Interventions   Education Provided Provided Education  Provided Verbal Education On Nutrition, Blood Sugar Monitoring, When to see the doctor, Medication  Nutrition Interventions   Nutrition Discussed/Reviewed Nutrition Discussed, Fluid intake, Decreasing salt  Pharmacy Interventions   Pharmacy Dicussed/Reviewed Pharmacy Topics Discussed, Medications and their functions, Medication Adherence        SIGNATURE Dudley Major RN, BSN,CCM, CDE Care Management Coordinator [facility] Care Management [phone]

## 2022-09-13 DIAGNOSIS — I5033 Acute on chronic diastolic (congestive) heart failure: Secondary | ICD-10-CM | POA: Diagnosis not present

## 2022-09-13 DIAGNOSIS — L03116 Cellulitis of left lower limb: Secondary | ICD-10-CM | POA: Diagnosis not present

## 2022-09-13 DIAGNOSIS — E1122 Type 2 diabetes mellitus with diabetic chronic kidney disease: Secondary | ICD-10-CM | POA: Diagnosis not present

## 2022-09-13 DIAGNOSIS — L89893 Pressure ulcer of other site, stage 3: Secondary | ICD-10-CM | POA: Diagnosis not present

## 2022-09-13 DIAGNOSIS — I130 Hypertensive heart and chronic kidney disease with heart failure and stage 1 through stage 4 chronic kidney disease, or unspecified chronic kidney disease: Secondary | ICD-10-CM | POA: Diagnosis not present

## 2022-09-13 DIAGNOSIS — N1831 Chronic kidney disease, stage 3a: Secondary | ICD-10-CM | POA: Diagnosis not present

## 2022-09-14 ENCOUNTER — Ambulatory Visit (INDEPENDENT_AMBULATORY_CARE_PROVIDER_SITE_OTHER): Payer: Medicare Other | Admitting: Family Medicine

## 2022-09-14 ENCOUNTER — Encounter: Payer: Self-pay | Admitting: Family Medicine

## 2022-09-14 VITALS — BP 134/72 | HR 76 | Temp 97.0°F | Ht 63.0 in | Wt 128.8 lb

## 2022-09-14 DIAGNOSIS — I480 Paroxysmal atrial fibrillation: Secondary | ICD-10-CM

## 2022-09-14 DIAGNOSIS — E1159 Type 2 diabetes mellitus with other circulatory complications: Secondary | ICD-10-CM

## 2022-09-14 DIAGNOSIS — Z794 Long term (current) use of insulin: Secondary | ICD-10-CM | POA: Diagnosis not present

## 2022-09-14 DIAGNOSIS — R410 Disorientation, unspecified: Secondary | ICD-10-CM

## 2022-09-14 DIAGNOSIS — G9341 Metabolic encephalopathy: Secondary | ICD-10-CM

## 2022-09-14 DIAGNOSIS — N390 Urinary tract infection, site not specified: Secondary | ICD-10-CM | POA: Diagnosis not present

## 2022-09-14 MED ORDER — UNILET COMFORTOUCH LANCET MISC
1.0000 | Freq: Two times a day (BID) | 5 refills | Status: DC
Start: 2022-09-14 — End: 2023-01-28

## 2022-09-14 MED ORDER — POTASSIUM CHLORIDE CRYS ER 20 MEQ PO TBCR: 20.0000 meq | EXTENDED_RELEASE_TABLET | Freq: Every day | ORAL | 0 refills | Status: DC

## 2022-09-14 NOTE — Patient Instructions (Signed)
Try Biofreeze or Diclofenac for your neck pain

## 2022-09-14 NOTE — Progress Notes (Signed)
Provider:  Jacalyn Lefevre, MD  Careteam: Patient Care Team: Kayla Kuster, MD as PCP - General (Family Medicine) Kayla Stade, MD as PCP - Cardiology (Cardiology) Kayla Gallo, MD as Consulting Physician (Family Medicine) [name] as Physician Assistant (Cardiology) Kayla Chimes, MD as Consulting Physician (Ophthalmology) Kayla Fee, MD as Attending Physician (Gastroenterology) Kayla Harder, MD (Inactive) as Consulting Physician (Dermatology) Kayla Churches, RN as [facility] Care Management  PLACE OF SERVICE:  [facility]  Advanced Directive information    Allergies  Allergen Reactions   Kiwi Extract Anaphylaxis   Gabapentin Nausea And Vomiting   Tdap [Tetanus-Diphth-Acell Pertussis] Swelling and Other (See Comments)    Swelling at injection site, gets very hot   Latex Itching, Dermatitis and Rash   Statins Other (See Comments)    RHABDOMYOLYSIS   Tramadol Nausea And Vomiting    Chief Complaint  Patient presents with   Hospitalization Follow-up    Patient presents today for a hospital follow-up. She was admitted into [facility] hospital on 09/02/22-09/09/22 for acute lower UTI.     HPI: Patient is a 77 y.o. female who presents today after being discharged from the hospital for a transitions of care visit.  She was hospitalized from April 26 to Sep 09, 2022 for lower urinary tract infection.  Prior to that she had been hospitalized from April 14 to April 16 for subdural subarachnoid hemorrhage secondary to a mechanical fall.  During that hospitalization urine culture grew Klebsiella treated with fosfomycin.  10 days later she was admitted for fever chills and confusion.  CT scan of the head showed improvement in previous subdural but urine culture grew Klebsiella.  She was again treated this time with a 5-day course of IV meropenem.  Altered mentation improved and she was discharged back home. Today she is accompanied by her  daughter.  They both report increasing confusion and issues with memory that seemed to have gotten worse after the subdural. She questions today why she gets so many urinary tract infections.  We discussed possibilities such as fluid intake incontinence and poor diabetes control.  Review of Systems:  Review of Systems  Constitutional:  Positive for malaise/fatigue.  HENT: Negative.    Respiratory: Negative.    Cardiovascular: Negative.   Gastrointestinal: Negative.   Genitourinary:  Positive for dysuria and frequency.  Musculoskeletal:  Positive for neck pain.  Skin: Negative.   Neurological: Negative.   Psychiatric/Behavioral:  Positive for memory loss.   All other systems reviewed and are negative.   Past Medical History:  Diagnosis Date   Chronic cystitis    Chronic diastolic CHF (congestive heart failure) (HCC)    followed by dr [name]   Chronic kidney disease, stage I    Cirrhosis of liver without ascites (HCC) 03/2016   followed by dr [name] (GI);   due to fatty liver;   post op cabg 09/ 2017  , post paracentesis for ascites   Coronary artery disease 01/2016   cardiologist--- dr [name];   01-27-2016  NSTEMI  s/p cardiac cath;   s/p CABG 01/29/16: LIMA-LAD, SVG-RI, SVG-dRCA   DDD (degenerative disc disease), cervical    per pt w/ chronic neck pain   Dyspnea    occasional   Edema of right lower extremity    Esophageal varices (HCC)    followed by dr [name];   hx multiple banding of large varices   Essential hypertension    no meds  GERD (gastroesophageal reflux disease)    Heart murmur     DX FOR YEARS ASYMPTOMATIC   Hepatic encephalopathy (HCC)    History of adenomatous polyp of colon    History of COVID-19 04/2020   per pt mild  symptoms that resolve   History of kidney stones    passed stones   History of non-ST elevation myocardial infarction (NSTEMI) 01/27/2016   Hyperlipidemia    diet controlled   NAFLD (nonalcoholic fatty liver disease)    Neuropathy,  peripheral 04/01/2015   OA (osteoarthritis)    Paroxysmal A-fib (HCC)    08/19/19 afib with RVR, spontaneously converted to SR   Portal vein thrombosis    per dr [name]  ,  chronic non-occluded   Thrombocytopenia (HCC)    followed by pcp  and hematology-- dr [name];   likely secondary to liver cirrhosis   Type 2 diabetes mellitus treated with insulin (HCC)    Type 2, followed by pcp    (07-21-2021  per pt checks blood sugar dialy in am fasting average 113--200s)   Urge incontinence of urine    Wears hearing aid in both ears    Past Surgical History:  Procedure Laterality Date   CARDIAC CATHETERIZATION N/A 01/27/2016   Procedure: Left Heart Cath and Coronary Angiography;  Surgeon: Kayla Records, MD;  Location: [facility] INVASIVE CV LAB;  Service: Cardiovascular;  Laterality: N/A;   COLONOSCOPY WITH PROPOFOL N/A 07/07/2016   Procedure: COLONOSCOPY WITH PROPOFOL;  Surgeon: Kayla Fee, MD;  Location: [facility] ENDOSCOPY;  Service: Endoscopy;  Laterality: N/A;   CORONARY ARTERY BYPASS GRAFT N/A 01/28/2016   Procedure: CORONARY ARTERY BYPASS GRAFTING (CABG) x 3 USING RIGHT LEG GREATER SAPHENOUS VEIN GRAFT;  Surgeon: Kayla Slot, MD;  Location: [facility] OR;  Service: Open Heart Surgery;  Laterality: N/A;   CYSTOSCOPY WITH RETROGRADE PYELOGRAM, URETEROSCOPY AND STENT PLACEMENT Left 05/30/2021   Procedure: CYSTOSCOPY WITH RETROGRADE PYELOGRAM, URETEROSCOPY AND STENT PLACEMENT;  Surgeon: Kayla Paci, MD;  Location: [facility] OR;  Service: Urology;  Laterality: Left;   CYSTOSCOPY/URETEROSCOPY/HOLMIUM LASER/STENT PLACEMENT Left 07/26/2021   Procedure: CYSTOSCOPY/RETROGRADE/URETEROSCOPY/HOLMIUM LASER/STENT EXCHANGE;  Surgeon: Kayla Hick, MD;  Location: [facility];  Service: Urology;  Laterality: Left;   ENDOVEIN HARVEST OF GREATER SAPHENOUS VEIN Right 01/28/2016   Procedure: ENDOVEIN HARVEST OF GREATER SAPHENOUS VEIN;  Surgeon: Kayla Slot, MD;  Location: [facility] OR;  Service: Open  Heart Surgery;  Laterality: Right;   ESOPHAGEAL BANDING  03/28/2019   Procedure: ESOPHAGEAL BANDING;  Surgeon: Kayla Fee, MD;  Location: [facility] ENDOSCOPY;  Service: Endoscopy;;   ESOPHAGEAL BANDING  04/07/2019   Procedure: ESOPHAGEAL BANDING;  Surgeon: Charna Fara, MD;  Location: [facility] ENDOSCOPY;  Service: Endoscopy;;   ESOPHAGOGASTRODUODENOSCOPY N/A 04/07/2019   Procedure: ESOPHAGOGASTRODUODENOSCOPY (EGD);  Surgeon: Charna Yomaira, MD;  Location: [facility] ENDOSCOPY;  Service: Endoscopy;  Laterality: N/A;   ESOPHAGOGASTRODUODENOSCOPY (EGD) WITH PROPOFOL N/A 07/07/2016   Procedure: ESOPHAGOGASTRODUODENOSCOPY (EGD) WITH PROPOFOL;  Surgeon: Kayla Fee, MD;  Location: [facility] ENDOSCOPY;  Service: Endoscopy;  Laterality: N/A;   ESOPHAGOGASTRODUODENOSCOPY (EGD) WITH PROPOFOL N/A 03/28/2019   Procedure: ESOPHAGOGASTRODUODENOSCOPY (EGD) WITH PROPOFOL;  Surgeon: Kayla Fee, MD;  Location: [facility] ENDOSCOPY;  Service: Endoscopy;  Laterality: N/A;   EXTRACORPOREAL SHOCK WAVE LITHOTRIPSY  2010   HEMOSTASIS CLIP PLACEMENT  04/07/2019   Procedure: HEMOSTASIS CLIP PLACEMENT;  Surgeon: Charna Quanita, MD;  Location: [facility] ENDOSCOPY;  Service: Endoscopy;;   IR ANGIOGRAM SELECTIVE EACH ADDITIONAL VESSEL  04/08/2019  IR EMBO ART  VEN HEMORR LYMPH EXTRAV  INC GUIDE ROADMAPPING  04/08/2019   IR PARACENTESIS  04/08/2019   IR RADIOLOGIST EVAL & MGMT  06/13/2019   IR RADIOLOGIST EVAL & MGMT  09/25/2019   IR RADIOLOGIST EVAL & MGMT  07/07/2020   IR RADIOLOGIST EVAL & MGMT  07/30/2021   IR TIPS  04/08/2019   LAMINECTOMY WITH POSTERIOR LATERAL ARTHRODESIS LEVEL 1 Bilateral 02/25/2022   Procedure: Laminectomy and Foraminotomy - Lumbar four-Lumbar five - bilateral, posterolateral arthrodesis Lumbar four-five, extension of hardware Lumbar four-five;  Surgeon: Tia Alert, MD;  Location: [facility] OR;  Service: Neurosurgery;  Laterality: Bilateral;   MAXIMUM ACCESS (MAS)POSTERIOR LUMBAR INTERBODY FUSION (PLIF) 1 LEVEL Left 06/10/2015    Procedure: FOR MAXIMUM ACCESS (MAS) POSTERIOR LUMBAR INTERBODY FUSION (PLIF) LUMBAR THREE-FOUR EXTRAFORAMINAL MICRODISCECTOMY LUMBAR FIVE-SACRAL ONE LEFT;  Surgeon: Tia Alert, MD;  Location: [facility] NEURO ORS;  Service: Neurosurgery;  Laterality: Left;   RADIOLOGY WITH ANESTHESIA N/A 04/08/2019   Procedure: RADIOLOGY WITH ANESTHESIA;  Surgeon: Radiologist, Medication, MD;  Location: [facility] OR;  Service: Radiology;  Laterality: N/A;   SCLEROTHERAPY  04/07/2019   Procedure: SCLEROTHERAPY;  Surgeon: Charna Rhegan, MD;  Location: [facility] ENDOSCOPY;  Service: Endoscopy;;   TEE WITHOUT CARDIOVERSION N/A 01/28/2016   Procedure: TRANSESOPHAGEAL ECHOCARDIOGRAM (TEE);  Surgeon: Kayla Slot, MD;  Location: [facility] OR;  Service: Open Heart Surgery;  Laterality: N/A;   TUBAL LIGATION  1982   Dr Ninetta Lights HERNIA REPAIR N/A 09/10/2019   Procedure: HERNIA REPAIR UMBILICAL ADULT;  Surgeon: Manus Rudd, MD;  Location: [facility] OR;  Service: General;  Laterality: N/A;   VAGINAL HYSTERECTOMY  1997   Dr Vivien Rota   Social History:   reports that she has never smoked. She has never used smokeless tobacco. She reports that she does not drink alcohol and does not use drugs.  Family History  Problem Relation Age of Onset   Heart disease Mother    Breast cancer Mother    Lung cancer Father    Arthritis Sister    Arthritis Brother    Liver cancer Brother    Lymphoma Brother    Heart disease Maternal Grandmother    Heart disease Maternal Grandfather    Heart disease Paternal Grandmother    Heart disease Paternal Grandfather    Other Daughter        house fire   Breast cancer Maternal Aunt    Breast cancer Paternal Aunt    Colon cancer Neg Hx    Esophageal cancer Neg Hx    Rectal cancer Neg Hx    Stomach cancer Neg Hx     Medications: Patient's Medications  New Prescriptions   No medications on file  Previous Medications   ACETAMINOPHEN (TYLENOL) 500 MG TABLET    Take 1,000 mg by mouth every 6 (six) hours  as needed for moderate pain.   ARTIFICIAL TEARS (LACRILUBE) OINT OPHTHALMIC OINTMENT    Place into both eyes at bedtime.   CALCIUM CARB-CHOLECALCIFEROL (CALCIUM 600 + D PO)    Take 1 tablet by mouth in the morning and at bedtime.   CHOLECALCIFEROL (VITAMIN D-3) 25 MCG (1000 UT) CAPS    Take 1,000 Units by mouth daily.   CYANOCOBALAMIN (VITAMIN B12) 1000 MCG TABLET    Take 1,000 mcg by mouth daily.   EZETIMIBE (ZETIA) 10 MG TABLET    Take 1 tablet (10 mg total) by mouth daily.   FISH OIL-OMEGA-3 FATTY ACIDS 1000 MG CAPSULE  Take 1 g by mouth daily.   FUROSEMIDE (LASIX) 20 MG TABLET    Take 40 mg by mouth daily.   GLUCOSE BLOOD TEST STRIP    One Touch Ultra Blue test strips. Use to test blood sugar three times daily. Dx: E11.65   INSULIN GLARGINE (BASAGLAR KWIKPEN) 100 UNIT/ML    Inject 30 Units into the skin daily.   INSULIN LISPRO (HUMALOG) 100 UNIT/ML INJECTION    Inject 0-0.12 mLs (0-12 Units total) into the skin See admin instructions. Before each meal 3 times a day, 140-199 - 2 units, 200-250 - 4 units, 251-299 - 6 units,  300-349 - 8 units,  350 or above 10 units.   INSULIN PEN NEEDLE (B-D ULTRAFINE III SHORT PEN) 31G X 8 MM MISC    Use to give Insulin injections daily. Dx: E11.65   INSULIN SYRINGE-NEEDLE U-100 (INSULIN SYRINGE 1CC/31GX5/16") 31G X 5/16" 1 ML MISC    USE AS DIRECTED TO INJECT LEVIMIR   LACTULOSE (CHRONULAC) 10 GM/15ML SOLUTION    TAKE 30 ML BY MOUTH EVERY DAY   LANCETS (UNILET COMFORTOUCH LANCET) MISC    1 Device by Does not apply route in the morning and at bedtime.   MAGNESIUM PO    Take 1 tablet by mouth at bedtime.   NYSTATIN CREAM (MYCOSTATIN)    Apply 1 Application topically 2 (two) times daily.   PANTOPRAZOLE (PROTONIX) 40 MG TABLET    TAKE 1 TABLET(40 MG) BY MOUTH DAILY   POTASSIUM CHLORIDE SA (KLOR-CON M) 20 MEQ TABLET    Take 1 tablet (20 mEq total) by mouth daily for 10 days.   RIFAXIMIN (XIFAXAN) 550 MG TABS TABLET    Take 550 mg by mouth 2 (two) times daily.    SENNA-DOCUSATE (SENOKOT-S) 8.6-50 MG TABLET    Take 1 tablet by mouth at bedtime as needed for moderate constipation.  Modified Medications   No medications on file  Discontinued Medications   No medications on file    Physical Exam:  Vitals:   09/14/22 0857  BP: 134/72  Pulse: 76  Temp: (!) 97 F (36.1 C)  SpO2: 97%  Weight: 128 lb 12.8 oz (58.4 kg)  Height: 5\' 3"  (1.6 m)   Body mass index is 22.82 kg/m. Wt Readings from Last 3 Encounters:  09/14/22 128 lb 12.8 oz (58.4 kg)  09/09/22 135 lb 9.3 oz (61.5 kg)  08/26/22 122 lb 9.6 oz (55.6 kg)    Physical Exam Vitals and nursing note reviewed.  Constitutional:      Appearance: Normal appearance.  Cardiovascular:     Rate and Rhythm: Normal rate. Rhythm irregular.  Pulmonary:     Effort: Pulmonary effort is normal.     Breath sounds: Normal breath sounds.  Abdominal:     General: Bowel sounds are normal.     Palpations: Abdomen is soft.  Musculoskeletal:     Cervical back: Tenderness present.  Neurological:     General: No focal deficit present.     Mental Status: She is alert and oriented to person, place, and time.     Comments: Due to questions about memory control we did an MMSE today and she scored 20 out of 30 indicating at least a moderate degree of dementia.  This was discussed with the patient and daughter.  We talked about some ways to try to improve memory.  I will go ahead and start her on Aricept for at least a 51-month trial     Labs  reviewed: Basic Metabolic Panel: Recent Labs    05/27/22 2208 06/30/22 1715 08/23/22 0424 08/26/22 1453 09/07/22 0146 09/08/22 0444 09/09/22 0450  NA 133*   < > 134*   < > 131* 132* 137  K 3.9   < > 3.4*   < > 3.3* 3.5 3.5  CL 101   < > 106   < > 100 102 104  CO2 20*   < > 23   < > 24 25 27   GLUCOSE 158*   < > 203*   < > 305* 291* 165*  BUN 22   < > 15   < > 13 12 11   CREATININE 0.85   < > 0.67   < > 0.68 0.73 0.75  CALCIUM 9.5   < > 8.4*   < > 8.8* 8.8* 9.4   MG  --    < > 1.8   < > 1.8 1.8 1.9  PHOS  --   --  2.3*  --   --   --  2.7  TSH 2.468  --   --   --   --   --   --    < > = values in this interval not displayed.   Liver Function Tests: Recent Labs    08/22/22 0333 08/23/22 0424 08/26/22 1453 09/02/22 2124 09/04/22 0125 09/09/22 0450  AST 55*  --  50* 34 37  --   ALT 39  --  36* 35 30  --   ALKPHOS 76  --   --  92 79  --   BILITOT 1.3*  --  1.3* 2.1* 1.7*  --   PROT 6.1*  --  6.4 6.9 5.6*  --   ALBUMIN 2.4*   < >  --  3.1* 2.1* 2.0*   < > = values in this interval not displayed.   No results for input(s): "LIPASE", "AMYLASE" in the last 8760 hours. Recent Labs    06/30/22 1715 08/21/22 1757 09/02/22 2134  AMMONIA 24 22 32   CBC: Recent Labs    07/01/22 0143 07/03/22 0323 08/21/22 1211 08/22/22 0333 09/06/22 0437 09/07/22 0146 09/08/22 0444  WBC 13.5*   < > 4.3   < > 4.0 3.8* 3.8*  NEUTROABS 11.4*  --  3.0  --   --  2.7  --   HGB 12.1   < > 11.2*   < > 9.8* 10.0* 9.9*  HCT 37.8   < > 35.5*   < > 29.0* 30.8* 31.0*  MCV 96.2   < > 97.5   < > 94.5 96.3 97.8  PLT 197   < > 134*   < > 84* 83* 86*   < > = values in this interval not displayed.   Lipid Panel: Recent Labs    01/04/22 1434  CHOL 174  HDL 89  LDLCALC 68  TRIG 91  CHOLHDL 2.0   TSH: Recent Labs    05/27/22 2208  TSH 2.468   A1C: Lab Results  Component Value Date   HGBA1C 8.8 (H) 08/03/2022     Assessment/Plan  1. Type 2 diabetes mellitus with other circulatory complication, with long-term current use of insulin (HCC) Patient's diabetes medicines have been changed around and around with her hospitalizations.  Today I sent her home on Tresiba 30 units and then lispro insulin with each meal 3 to 5 units depending upon the size of the meal.  Will check A1c today  2. Acute delirium Seem  to be related to UTI but with baseline and underlying dementia  3. Paroxysmal atrial fibrillation (HCC) Patient's rhythm today was irregular  consistent with A-fib  4. Acute lower UTI Treated with 5-day course of Merrem pendulum.  Encouraged increased fluid intake, better control of diabetes, and frequent changes of depends  5. Acute metabolic encephalopathy This has been related to her cirrhosis of the liver.  She is on Xifaxan as well as lactulose   Kayla Lefevre, MD [facility] [phone]

## 2022-09-15 ENCOUNTER — Telehealth: Payer: Self-pay

## 2022-09-15 ENCOUNTER — Inpatient Hospital Stay: Payer: Medicare Other | Admitting: Nurse Practitioner

## 2022-09-15 DIAGNOSIS — E1159 Type 2 diabetes mellitus with other circulatory complications: Secondary | ICD-10-CM

## 2022-09-15 LAB — BASIC METABOLIC PANEL WITH GFR
BUN: 19 mg/dL (ref 7–25)
CO2: 29 mmol/L (ref 20–32)
Calcium: 9.8 mg/dL (ref 8.6–10.4)
Chloride: 102 mmol/L (ref 98–110)
Creat: 0.76 mg/dL (ref 0.60–1.00)
Glucose, Bld: 399 mg/dL — ABNORMAL HIGH (ref 65–99)
Potassium: 3.7 mmol/L (ref 3.5–5.3)
Sodium: 138 mmol/L (ref 135–146)
eGFR: 82 mL/min/{1.73_m2} (ref >=60)

## 2022-09-15 MED ORDER — INSULIN LISPRO 100 UNIT/ML IJ SOLN
0.0000 [IU] | INTRAMUSCULAR | 0 refills | Status: DC
Start: 2022-09-15 — End: 2022-09-16

## 2022-09-15 NOTE — Telephone Encounter (Signed)
Humalog insulin refilled.will have Dr.Miller verify if [name] was to be prescribed or continue on Lantus.

## 2022-09-15 NOTE — Telephone Encounter (Signed)
Patient seen Dr.Miller yesterday and rx for humalog was to be sent in, however per [name] (daughter) "The pharmacy has not received anything."   RX is pending and will be sent to providers for review and approval. Below is Dr.Millers assessment and plan from yesterdays visit in regards to diabetes:  1. Type 2 diabetes mellitus with other circulatory complication, with long-term current use of insulin (HCC) Patient's diabetes medicines have been changed around and around with her hospitalizations.  Today I sent her home on Tresiba 30 units and then lispro insulin with each meal 3 to 5 units depending upon the size of the meal.  Will check A1c today

## 2022-09-15 NOTE — Telephone Encounter (Signed)
Vikki Ports with [facility] called requesting verbal orders for resumption of care Skilled nursing, OT and speech therapy.  Verbal orders given

## 2022-09-16 ENCOUNTER — Telehealth: Payer: Self-pay

## 2022-09-16 ENCOUNTER — Encounter (HOSPITAL_BASED_OUTPATIENT_CLINIC_OR_DEPARTMENT_OTHER): Payer: Medicare Other | Attending: Internal Medicine | Admitting: Internal Medicine

## 2022-09-16 ENCOUNTER — Other Ambulatory Visit: Payer: Self-pay

## 2022-09-16 DIAGNOSIS — K7460 Unspecified cirrhosis of liver: Secondary | ICD-10-CM

## 2022-09-16 DIAGNOSIS — Z794 Long term (current) use of insulin: Secondary | ICD-10-CM

## 2022-09-16 DIAGNOSIS — I5033 Acute on chronic diastolic (congestive) heart failure: Secondary | ICD-10-CM | POA: Diagnosis not present

## 2022-09-16 DIAGNOSIS — L89312 Pressure ulcer of right buttock, stage 2: Secondary | ICD-10-CM

## 2022-09-16 DIAGNOSIS — L03116 Cellulitis of left lower limb: Secondary | ICD-10-CM | POA: Diagnosis not present

## 2022-09-16 DIAGNOSIS — E43 Unspecified severe protein-calorie malnutrition: Secondary | ICD-10-CM | POA: Diagnosis not present

## 2022-09-16 DIAGNOSIS — E11622 Type 2 diabetes mellitus with other skin ulcer: Secondary | ICD-10-CM

## 2022-09-16 DIAGNOSIS — L89893 Pressure ulcer of other site, stage 3: Secondary | ICD-10-CM | POA: Diagnosis not present

## 2022-09-16 DIAGNOSIS — N1831 Chronic kidney disease, stage 3a: Secondary | ICD-10-CM | POA: Diagnosis not present

## 2022-09-16 DIAGNOSIS — E1122 Type 2 diabetes mellitus with diabetic chronic kidney disease: Secondary | ICD-10-CM | POA: Diagnosis not present

## 2022-09-16 DIAGNOSIS — I130 Hypertensive heart and chronic kidney disease with heart failure and stage 1 through stage 4 chronic kidney disease, or unspecified chronic kidney disease: Secondary | ICD-10-CM | POA: Diagnosis not present

## 2022-09-16 DIAGNOSIS — L97825 Non-pressure chronic ulcer of other part of left lower leg with muscle involvement without evidence of necrosis: Secondary | ICD-10-CM

## 2022-09-16 LAB — AEROBIC CULTURE W GRAM STAIN (SUPERFICIAL SPECIMEN): Gram Stain: NONE SEEN

## 2022-09-16 MED ORDER — INSULIN LISPRO 100 UNIT/ML IJ SOLN
0.0000 [IU] | INTRAMUSCULAR | 0 refills | Status: DC
Start: 2022-09-16 — End: 2022-09-19

## 2022-09-16 NOTE — Telephone Encounter (Signed)
[name] from [facility] called for verbal order on patient. He's requesting PT once a week for 2 weeks. Verbal orders given.

## 2022-09-16 NOTE — Progress Notes (Signed)
CURSTIN, NORTHROP ([id number]) [id number] Page 1 of 8 Visit Report for 09/16/2022 Chief Complaint Document Details Patient Name: Date of Service: [name] 09/16/2022 10:45 A M Medical Record Number: [id number] Patient Account Number: [id number] Date of Birth/Sex: Treating RN: [birth year] (77 y.o. F) Primary Care Provider: Jacalyn Lefevre Other Clinician: Referring Provider: Treating Provider/Extender: Lanny Hurst in Treatment: 4 Information Obtained from: Patient Chief Complaint 08/16/2022; left posterior leg wound and skin breakdown to the right gluteus Electronic Signature(s) Signed: 09/16/2022 12:03:31 PM By: Geralyn Corwin DO Entered By: Geralyn Corwin on 09/16/2022 11:41:13 -------------------------------------------------------------------------------- HPI Details Patient Name: Date of Service: Kayla July C. 09/16/2022 10:45 A M Medical Record Number: [id number] Patient Account Number: [id number] Date of Birth/Sex: Treating RN: [birth year] (77 y.o. F) Primary Care Provider: Jacalyn Lefevre Other Clinician: Referring Provider: Treating Provider/Extender: Lanny Hurst in Treatment: 4 History of Present Illness HPI Description: 08/16/2022 Ms. [name] is a 77 year old female with a past medical history of cirrhosis of the liver, uncontrolled insulin-dependent type 2 diabetes, severe protein malnutrition that presents the clinic for a left posterior leg wound and skin breakdown to the right buttocks. Daughter is present and helps provide the history. She states that she developed cellulitis to the left leg and subsequently developed a wound. She states this has been present for the past 3 months. She has been treated in the ED for this issue with antibiotics. Currently she is using silver alginate to both wound sites. Daughter states that the patient has been dealing with  skin breakdown to the buttocks areas over the past year. They wax and wane in healing. Patient is mostly sedentary. She currently denies signs of infection. 4/23; Patient presents for follow up. Plan was for silver alginate to the wound beds. The right buttocks has healed. T the left posterior leg there was iodoform o laid on top of the wound bed. Patient has home health and this is what they have been doing. Currently patient denies signs of infection. 5/10; patient presents for follow-up. Patient has been using Vashe wet-to-dry dressings to the left posterior leg wound. Again this is not being packed. Electronic Signature(s) Signed: 09/16/2022 12:03:31 PM By: Geralyn Corwin DO Entered By: Geralyn Corwin on 09/16/2022 11:41:55 -------------------------------------------------------------------------------- Physical Exam Details Patient Name: Date of Service: [name] 09/16/2022 10:45 A M Medical Record Number: [id number] Patient Account Number: [id number] Date of Birth/Sex: Treating RN: 1945/06/12 (77 y.o. F) Primary Care Provider: Jacalyn Lefevre Other Clinician: Referring Provider: Treating Provider/Extender: Lanny Hurst in Treatment: 4 Constitutional respirations regular, non-labored and within target range for patient.. Cardiovascular 2+ dorsalis pedis/posterior tibialis pulses. [name], [name] ([id number]) [id number] Page 2 of 8 Psychiatric pleasant and cooperative. Notes T the posterior left leg there is a circular wound that tunnels. With minimal erythema to the surrounding periwound. o Electronic Signature(s) Signed: 09/16/2022 12:03:31 PM By: Geralyn Corwin DO Entered By: Geralyn Corwin on 09/16/2022 11:42:27 -------------------------------------------------------------------------------- Physician Orders Details Patient Name: Date of Service: Kayla July C. 09/16/2022 10:45 A M Medical  Record Number: [id number] Patient Account Number: [id number] Date of Birth/Sex: Treating RN: April 29, 1946 (77 y.o. 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 Mikey Bussing Return A Other: - Will call you once the culture results return. Anesthetic (In clinic) Topical Lidocaine 4% applied to wound bed Bathing/ Shower/ Hygiene May shower with protection but do not get wound dressing(s) wet. Protect dressing(s) with water repellant cover (for example, large plastic bag) or a cast cover and may then take shower. Edema Control - Lymphedema / SCD / Other Elevate legs to the level of the heart or above for 30 minutes daily and/or when sitting for 3-4 times a day throughout the day. Avoid standing for long periods of time. Patient to wear own compression stockings every day. - apply in the morning and remove at night. Exercise regularly Moisturize legs daily. Off-Loading Gel mattress overlay (Group 1) - Medical modalities group one mattress for patient. Home Health New wound care orders this week; continue Home Health for wound care. May utilize formulary equivalent dressing for wound treatment orders unless otherwise specified. - change 2 times a week by home health and all other days daughter to change. vashe with 1/4 packing strip into tunnel and wound bed. Other Home Health Orders/Instructions: - [facility] Wound Treatment Wound #1 - Lower Leg Wound Laterality: Left, Posterior Cleanser: Wound Cleanser (Home Health) 1 x Per Day/30 Days Discharge Instructions: Cleanse the wound with wound cleanser prior to applying a clean dressing using gauze sponges, not tissue or cotton balls. Peri-Wound Care: Skin Prep (Home Health) 1 x Per Day/30 Days Discharge Instructions: Use skin prep as directed Prim Dressing: Vashe wet 1/4 packing strip (Home Health) 1 x Per Day/30 Days ary Discharge Instructions: vashe moisten packing strip packed into wound tunnel and wound bed. Secondary Dressing: Zetuvit Plus Silicone Border Dressing 4x4 (in/in) (Home Health) 1 x Per Day/30 Days Discharge Instructions: Apply silicone border over primary dressing as directed. [name], [name] ([id number]) [id number] Page 3 of 8 Laboratory erobe culture (MICRO) - left leg wound culture - (ICD10 L97.825 - Non-pressure chronic ulcer of Bacteria identified in Unspecified specimen by A other part of left lower leg with muscle involvement without evidence of necrosis) LOINC Code: 634-6 Convenience Name: Aerobic culture-specimen not specified Electronic Signature(s) Signed: 09/16/2022 12:03:31 PM By: Geralyn Corwin DO Entered By: Geralyn Corwin on 09/16/2022 11:42:36 -------------------------------------------------------------------------------- Problem List Details Patient Name: Date of Service: Kayla July C. 09/16/2022 10:45 A M Medical Record Number: [id number] Patient Account Number: [id number] Date of Birth/Sex: Treating RN: 1945-08-23 (77 y.o. [name], [name] Primary Care Provider: Jacalyn Lefevre Other Clinician: Referring Provider: Treating Provider/Extender: Lanny Hurst in Treatment: 4 Active Problems ICD-10 Encounter Code Description Active Date MDM Diagnosis [phone] Non-pressure chronic ulcer of other part of left lower leg with muscle  08/16/2022 No Yes involvement without evidence of necrosis E11.622 Type 2 diabetes mellitus with other skin ulcer 08/16/2022 No Yes L89.312 Pressure ulcer of right buttock, stage 2 08/16/2022 No Yes K74.60 Unspecified cirrhosis of liver 08/16/2022 No Yes E43 Unspecified severe protein-calorie malnutrition 08/16/2022 No Yes Inactive Problems Resolved Problems Electronic Signature(s) Signed: 09/16/2022 12:03:31 PM By: Geralyn Corwin DO Entered By: Geralyn Corwin on 09/16/2022 11:40:49 -------------------------------------------------------------------------------- Progress Note Details Patient Name: Date of Service: Kayla Dillon BETH C. 09/16/2022 10:45 A M Medical Record  Number: [id number] Patient Account Number: [id number] Date of Birth/Sex: Treating RN: Jan 06, 1946 (77 y.o. F) Primary Care Provider: Jacalyn Lefevre Other Clinician: Referring Provider: Treating Provider/Extender: Lanny Hurst in Treatment: [address] [place], [place] ([id number]) [id number] Page 4 of 8 Chief Complaint Information obtained from Patient 08/16/2022; left posterior leg wound and skin breakdown to the right gluteus History of Present Illness (HPI) 08/16/2022 Ms. [name] is a 77 year old female with a past medical history of cirrhosis of the liver, uncontrolled insulin-dependent type 2 diabetes, severe protein malnutrition that presents the clinic for a left posterior leg wound and skin breakdown to the right buttocks. Daughter is present and helps provide the history. She states that she developed cellulitis to the left leg and subsequently developed a wound. She states this has been present for the past 3 months. She has been treated in the ED for this issue with antibiotics. Currently she is using silver alginate to both wound sites. Daughter states that the patient has been dealing with skin breakdown to the buttocks areas over the past year. They  wax and wane in healing. Patient is mostly sedentary. She currently denies signs of infection. 4/23; Patient presents for follow up. Plan was for silver alginate to the wound beds. The right buttocks has healed. T the left posterior leg there was iodoform o laid on top of the wound bed. Patient has home health and this is what they have been doing. Currently patient denies signs of infection. 5/10; patient presents for follow-up. Patient has been using Vashe wet-to-dry dressings to the left posterior leg wound. Again this is not being packed. Patient History Information obtained from Patient. Family History Cancer - Father, Heart Disease - Mother, Hypertension - Mother, Lung Disease - Father, No family history of Diabetes, Hereditary Spherocytosis, Kidney Disease, Seizures, Stroke, Thyroid Problems, Tuberculosis. Social History Never smoker, Marital Status - Married, Alcohol Use - Never, Drug Use - No History, Caffeine Use - Daily. Medical History Ear/Nose/Mouth/Throat Patient has history of Chronic sinus problems/congestion Denies history of Middle ear problems Hematologic/Lymphatic Denies history of Anemia, Hemophilia, Human Immunodeficiency Virus, Lymphedema, Sickle Cell Disease Respiratory Denies history of Aspiration, Asthma, Chronic Obstructive Pulmonary Disease (COPD), Pneumothorax, Sleep Apnea, Tuberculosis Cardiovascular Patient has history of Arrhythmia - Paroxysymal A fib, Congestive Heart Failure, Coronary Artery Disease, Hypertension Denies history of Angina, Deep Vein Thrombosis, Hypotension, Myocardial Infarction, Peripheral Arterial Disease, Peripheral Venous Disease, Phlebitis, Vasculitis Gastrointestinal Patient has history of Cirrhosis - fatty liver Denies history of Colitis, Crohnoos, Hepatitis A, Hepatitis B, Hepatitis C Endocrine Patient has history of Type II Diabetes Denies history of Type I Diabetes Immunological Denies history of Lupus Erythematosus,  Raynaudoos, Scleroderma Integumentary (Skin) Denies history of History of Burn Musculoskeletal Patient has history of Osteoarthritis Denies history of Gout, Rheumatoid Arthritis, Osteomyelitis Neurologic Patient has history of Neuropathy Denies history of Dementia, Quadriplegia, Paraplegia, Seizure Disorder Oncologic Denies history of Received Chemotherapy, Received Radiation Psychiatric Patient has history of Confinement Anxiety Denies history of Anorexia/bulimia Hospitalization/Surgery History - tips in liver. - open heart surgery. - CVA x 2. Objective Constitutional respirations regular, non-labored and within target range for patient.. Vitals Time Taken: 11:03 AM, Height: 63 in, Weight: 120 lbs, BMI: 21.3, Temperature: 98.7 F, Pulse: 67 bpm, Respiratory Rate: 16 breaths/min, Blood Pressure: 127/63 mmHg. Cardiovascular 2+ dorsalis pedis/posterior tibialis pulses. Psychiatric pleasant and cooperative. [name], [name] ([id number]) [id number] Page 5 of 8 General Notes: T the posterior left leg there is a circular wound that tunnels.  With minimal erythema to the surrounding periwound. o Integumentary (Hair, Skin) Wound #1 status is Open. Original cause of wound was Bump. The date acquired was: 05/09/2022. The wound has been in treatment 4 weeks. The wound is located on the Left,Posterior Lower Leg. The wound measures 0.3cm length x 0.3cm width x 1cm depth; 0.071cm^2 area and 0.071cm^3 volume. There is tendon exposed. There is no undermining noted, however, there is tunneling at 12:00 with a maximum distance of 3cm. There is a medium amount of serosanguineous drainage noted. The wound margin is distinct with the outline attached to the wound base. There is large (67-100%) pink, pale granulation within the wound bed. There is no necrotic tissue within the wound bed. The periwound skin appearance did not exhibit: Callus, Crepitus, Excoriation, Induration,  Rash, Scarring, Dry/Scaly, Maceration, Atrophie Blanche, Cyanosis, Ecchymosis, Hemosiderin Staining, Mottled, Pallor, Rubor, Erythema. Assessment Active Problems ICD-10 Non-pressure chronic ulcer of other part of left lower leg with muscle involvement without evidence of necrosis Type 2 diabetes mellitus with other skin ulcer Pressure ulcer of right buttock, stage 2 Unspecified cirrhosis of liver Unspecified severe protein-calorie malnutrition Patient's wound has overall improved in size and appearance over the course of this admission however she still does have a significant tunnel and there is mild erythema to the periwound. She has been on Keflex, doxycycline and linezolid for this issue. I recommended a wound culture at this time. For now she can continue Vashe wet-to-dry dressings. I expressed the importance of packing this area. On intake the packing was noted over the wound. Follow up in one week. Plan Follow-up Appointments: Return Appointment in: - 09/26/2022 1100 room 8 Dr. Mikey Bussing Other: - Will call you once the culture results return. Anesthetic: (In clinic) Topical Lidocaine 4% applied to wound bed Bathing/ Shower/ Hygiene: May shower with protection but do not get wound dressing(s) wet. Protect dressing(s) with water repellant cover (for example, large plastic bag) or a cast cover and may then take shower. Edema Control - Lymphedema / SCD / Other: Elevate legs to the level of the heart or above for 30 minutes daily and/or when sitting for 3-4 times a day throughout the day. Avoid standing for long periods of time. Patient to wear own compression stockings every day. - apply in the morning and remove at night. Exercise regularly Moisturize legs daily. Off-Loading: Gel mattress overlay (Group 1) - Medical modalities group one mattress for patient. Home Health: New wound care orders this week; continue Home Health for wound care. May utilize formulary equivalent dressing  for wound treatment orders unless otherwise specified. - change 2 times a week by home health and all other days daughter to change. vashe with 1/4 packing strip into tunnel and wound bed. Other Home Health Orders/Instructions: - [facility] Laboratory ordered were: Aerobic culture-specimen not specified - left leg wound culture WOUND #1: - Lower Leg Wound Laterality: Left, Posterior Cleanser: Wound Cleanser (Home Health) 1 x Per Day/30 Days Discharge Instructions: Cleanse the wound with wound cleanser prior to applying a clean dressing using gauze sponges, not tissue or cotton balls. Peri-Wound Care: Skin Prep (Home Health) 1 x Per Day/30 Days Discharge Instructions: Use skin prep as directed Prim Dressing: Vashe wet 1/4 packing strip (Home Health) 1 x Per Day/30 Days ary Discharge Instructions: vashe moisten packing strip packed into wound tunnel and wound bed. Secondary Dressing: Zetuvit Plus Silicone Border Dressing 4x4 (in/in) (Home Health) 1 x Per Day/30 Days Discharge Instructions: Apply silicone border over primary dressing  as directed. 1. Wound culture 2. Vashe wet-to-dry dressings 3. Follow-up in 1 week Electronic Signature(s) Signed: 09/16/2022 12:03:31 PM By: Geralyn Corwin DO Entered By: Geralyn Corwin on 09/16/2022 11:45:53 [name] ([id number]) [id number] Page 6 of 8 -------------------------------------------------------------------------------- HxROS Details Patient Name: Date of Service: [name] 09/16/2022 10:45 A M Medical Record Number: [id number] Patient Account Number: [id number] Date of Birth/Sex: Treating RN: [birth year] (77 y.o. F) Primary Care Provider: Jacalyn Lefevre Other Clinician: Referring Provider: Treating Provider/Extender: Lanny Hurst in Treatment: 4 Information Obtained From Patient Ear/Nose/Mouth/Throat Medical History: Positive for: Chronic sinus  problems/congestion Negative for: Middle ear problems Hematologic/Lymphatic Medical History: Negative for: Anemia; Hemophilia; Human Immunodeficiency Virus; Lymphedema; Sickle Cell Disease Respiratory Medical History: Negative for: Aspiration; Asthma; Chronic Obstructive Pulmonary Disease (COPD); Pneumothorax; Sleep Apnea; Tuberculosis Cardiovascular Medical History: Positive for: Arrhythmia - Paroxysymal A fib; Congestive Heart Failure; Coronary Artery Disease; Hypertension Negative for: Angina; Deep Vein Thrombosis; Hypotension; Myocardial Infarction; Peripheral Arterial Disease; Peripheral Venous Disease; Phlebitis; Vasculitis Gastrointestinal Medical History: Positive for: Cirrhosis - fatty liver Negative for: Colitis; Crohns; Hepatitis A; Hepatitis B; Hepatitis C Endocrine Medical History: Positive for: Type II Diabetes Negative for: Type I Diabetes Time with diabetes: 10 years Treated with: Insulin Blood sugar tested every day: Yes Tested : once Immunological Medical History: Negative for: Lupus Erythematosus; Raynauds; Scleroderma Integumentary (Skin) Medical History: Negative for: History of Burn Musculoskeletal Medical History: Positive for: Osteoarthritis Negative for: Gout; Rheumatoid Arthritis; Osteomyelitis Neurologic Medical History: Positive for: Neuropathy Negative for: Dementia; Quadriplegia; Paraplegia; Seizure Disorder Oncologic Medical History: Negative for: Received Chemotherapy; Received Radiation Psychiatric [name], [name] ([id number]) [id number] Page 7 of 8 Medical History: Positive for: Confinement Anxiety Negative for: Anorexia/bulimia HBO Extended History Items Ear/Nose/Mouth/Throat: Chronic sinus problems/congestion Immunizations Pneumococcal Vaccine: Received Pneumococcal Vaccination: Yes Received Pneumococcal Vaccination On or After 60th Birthday: Yes Implantable Devices None Hospitalization /  Surgery History Type of Hospitalization/Surgery tips in liver open heart surgery CVA x 2 Family and Social History Cancer: Yes - Father; Diabetes: No; Heart Disease: Yes - Mother; Hereditary Spherocytosis: No; Hypertension: Yes - Mother; Kidney Disease: No; Lung Disease: Yes - Father; Seizures: No; Stroke: No; Thyroid Problems: No; Tuberculosis: No; Never smoker; Marital Status - Married; Alcohol Use: Never; Drug Use: No History; Caffeine Use: Daily; Financial Concerns: No; Food, Clothing or Shelter Needs: No; Support System Lacking: No; Transportation Concerns: No Electronic Signature(s) Signed: 09/16/2022 12:03:31 PM By: Geralyn Corwin DO Entered By: Geralyn Corwin on 09/16/2022 11:42:01 -------------------------------------------------------------------------------- SuperBill Details Patient Name: Date of Service: [name] 09/16/2022 Medical Record Number: [id number] Patient Account Number: [id number] Date of Birth/Sex: Treating RN: Apr 05, 1946 (77 y.o. [name], Millard.Loa Primary Care Provider: Jacalyn Lefevre Other Clinician: Referring Provider: Treating Provider/Extender: Lanny Hurst in Treatment: 4 Diagnosis Coding ICD-10 Codes Code Description [phone] Non-pressure chronic ulcer of other part of left lower leg with muscle involvement without evidence of necrosis E11.622 Type 2 diabetes mellitus with other skin ulcer L89.312 Pressure ulcer of right buttock, stage 2 K74.60 Unspecified cirrhosis of liver E43 Unspecified severe protein-calorie malnutrition Facility Procedures : CPT4 Code: [id number] Description: 99213 - WOUND CARE VISIT-LEV 3 EST PT Modifier: Quantity: 1 Physician Procedures Electronic Signature(s) Signed: 09/16/2022 12:03:31 PM By: Geralyn Corwin DO Entered By: Geralyn Corwin on 09/16/2022 11:46:19

## 2022-09-17 LAB — AEROBIC CULTURE W GRAM STAIN (SUPERFICIAL SPECIMEN)

## 2022-09-17 NOTE — Progress Notes (Signed)
TEPHANIE, ARCH ([id number]) [id number] Page 1 of 9 Visit Report for 09/16/2022 Arrival Information Details Patient Name: Date of Service: [name] 09/16/2022 10:45 A M Medical Record Number: [id number] Patient Account Number: [id number] Date of Birth/Sex: Treating RN: [birth year] (77 y.o. F) Primary Care Kahlea Cobert: Jacalyn Lefevre Other Clinician: Referring Autymn Omlor: Treating Destany Severns/Extender: Lanny Hurst in Treatment: 4 Visit Information History Since Last Visit Added or deleted any medications: No Patient Arrived: Dan Humphreys Any new allergies or adverse reactions: No Arrival Time: 11:03 Had a fall or experienced change in No Accompanied By: daughter activities of daily living that may affect Transfer Assistance: None risk of falls: Patient Identification Verified: Yes Signs or symptoms of abuse/neglect since last visito No Secondary Verification Process Completed: Yes Hospitalized since last visit: No Patient Requires Transmission-Based Precautions: No Implantable device outside of the clinic excluding No Patient Has Alerts: No cellular tissue based products placed in the center since last visit: Has Dressing in Place as Prescribed: Yes Pain Present Now: Yes Electronic Signature(s) Signed: 09/16/2022 1:52:56 PM By: Karl Ito Entered By: Karl Ito on 09/16/2022 11:03:52 -------------------------------------------------------------------------------- Clinic Level of Care Assessment Details Patient Name: Date of Service: [name] 09/16/2022 10:45 A M Medical Record Number: [id number] Patient Account Number: [id number] Date of Birth/Sex: Treating RN: Oct 07, 1945 (77 y.o. Debara Pickett, Yvonne Kendall Primary Care Kip Cropp: Jacalyn Lefevre Other Clinician: Referring Breiana Stratmann: Treating Semaje Kinker/Extender: Lanny Hurst in Treatment: 4 Clinic Level of Care Assessment  Items TOOL 4 Quantity Score X- 1 0 Use when only an EandM is performed on FOLLOW-UP visit ASSESSMENTS - Nursing Assessment / Reassessment X- 1 10 Reassessment of Co-morbidities (includes updates in patient status) X- 1 5 Reassessment of Adherence to Treatment Plan ASSESSMENTS - Wound and Skin A ssessment / Reassessment X - Simple Wound Assessment / Reassessment - one wound 1 5 []  - 0 Complex Wound Assessment / Reassessment - multiple wounds X- 1 10 Dermatologic / Skin Assessment (not related to wound area) ASSESSMENTS - Focused Assessment X- 1 5 Circumferential Edema Measurements - multi extremities []  - 0 Nutritional Assessment / Counseling / Intervention []  - 0 Lower Extremity Assessment (monofilament, tuning fork, pulses) []  - 0 Peripheral Arterial Disease Assessment (using hand held doppler) ASSESSMENTS - Ostomy and/or Continence Assessment and Care []  - 0 Incontinence Assessment and Management []  - 0 Ostomy Care Assessment and Management (repouching, etc.) PROCESS - Coordination of Care X - Simple Patient / Family Education for ongoing care 1 15 [place], [place] C ([id number]) [id number] Page 2 of 9 []  - 0 Complex (extensive) Patient / Family Education for ongoing care X- 1 10 Staff obtains Consents, Records, T Results / Process Orders est X- 1 10 Staff telephones HHA, Nursing Homes / Clarify orders / etc []  - 0 Routine Transfer to another Facility (non-emergent condition) []  - 0 Routine Hospital Admission (non-emergent condition) []  - 0 New Admissions / Manufacturing engineer / Ordering NPWT Apligraf, etc. , []  - 0 Emergency Hospital Admission (emergent condition) X- 1 10 Simple Discharge Coordination []  - 0 Complex (extensive) Discharge Coordination PROCESS - Special Needs []  - 0 Pediatric / Minor Patient Management []  - 0 Isolation Patient Management []  - 0 Hearing / Language / Visual special needs []  - 0 Assessment of  Community assistance (transportation, D/C planning, etc.) []  - 0 Additional assistance / Altered mentation []  - 0 Support Surface(s) Assessment (bed, cushion, seat, etc.) INTERVENTIONS - Wound Cleansing /  Measurement X - Simple Wound Cleansing - one wound 1 5 []  - 0 Complex Wound Cleansing - multiple wounds X- 1 5 Wound Imaging (photographs - any number of wounds) []  - 0 Wound Tracing (instead of photographs) X- 1 5 Simple Wound Measurement - one wound []  - 0 Complex Wound Measurement - multiple wounds INTERVENTIONS - Wound Dressings X - Small Wound Dressing one or multiple wounds 1 10 []  - 0 Medium Wound Dressing one or multiple wounds []  - 0 Large Wound Dressing one or multiple wounds []  - 0 Application of Medications - topical []  - 0 Application of Medications - injection INTERVENTIONS - Miscellaneous []  - 0 External ear exam []  - 0 Specimen Collection (cultures, biopsies, blood, body fluids, etc.) X- 1 5 Specimen(s) / Culture(s) sent or taken to Lab for analysis []  - 0 Patient Transfer (multiple staff / Nurse, adult / Similar devices) []  - 0 Simple Staple / Suture removal (25 or less) []  - 0 Complex Staple / Suture removal (26 or more) []  - 0 Hypo / Hyperglycemic Management (close monitor of Blood Glucose) []  - 0 Ankle / Brachial Index (ABI) - do not check if billed separately X- 1 5 Vital Signs Has the patient been seen at the hospital within the last three years: Yes Total Score: 115 Level Of Care: New/Established - Level 3 Electronic Signature(s) Signed: 09/16/2022 4:16:18 PM By: Shawn Stall RN, BSN Entered By: Shawn Stall on 09/16/2022 11:26:39 Ollen Bowl ([id number]) [id number] Page 3 of 9 -------------------------------------------------------------------------------- Encounter Discharge Information Details Patient Name: Date of Service: [name] 09/16/2022 10:45 A M Medical Record Number:  [id number] Patient Account Number: [id number] Date of Birth/Sex: Treating RN: [birth year] (77 y.o. Arta Silence Primary Care Ellicia Alix: Jacalyn Lefevre Other Clinician: Referring Careen Mauch: Treating Morrie Daywalt/Extender: Lanny Hurst in Treatment: 4 Encounter Discharge Information Items Discharge Condition: Stable Ambulatory Status: Ambulatory Discharge Destination: Home Transportation: Private Auto Accompanied By: daughter Schedule Follow-up Appointment: Yes Clinical Summary of Care: Electronic Signature(s) Signed: 09/16/2022 4:16:18 PM By: Shawn Stall RN, BSN Entered By: Shawn Stall on 09/16/2022 11:27:02 -------------------------------------------------------------------------------- Lower Extremity Assessment Details Patient Name: Date of Service: Kayla July C. 09/16/2022 10:45 A M Medical Record Number: [id number] Patient Account Number: [id number] Date of Birth/Sex: Treating RN: [birth year] (77 y.o. Arta Silence Primary Care Malaka Ruffner: Jacalyn Lefevre Other Clinician: Referring Jakeia Carreras: Treating Rayna Brenner/Extender: Lanny Hurst in Treatment: 4 Edema Assessment Assessed: Kyra Searles: Yes] Franne Forts: No] Edema: [Left: Ye] [Right: s] Calf Left: Right: Point of Measurement: 33.5 cm From Medial Instep 30 cm Ankle Left: Right: Point of Measurement: 9.5 cm From Medial Instep 20 cm Electronic Signature(s) Signed: 09/16/2022 4:16:18 PM By: Shawn Stall RN, BSN Entered By: Shawn Stall on 09/16/2022 11:18:25 -------------------------------------------------------------------------------- Multi Wound Chart Details Patient Name: Date of Service: Kayla July C. 09/16/2022 10:45 A M Medical Record Number: [id number] Patient Account Number: [id number] Date of Birth/Sex: Treating RN: 10-24-1945 (77 y.o. F) Primary Care Cannon Arreola: Jacalyn Lefevre Other Clinician: Referring Clearnce Leja: Treating Jay Haskew/Extender:  Lanny Hurst in Treatment: 4 Vital Signs Height(in): 63 Pulse(bpm): 67 Weight(lbs): 120 Blood Pressure(mmHg): 127/63 Body Mass Index(BMI): 21.3 Temperature(F): 98.7 JERE, BRENNICK C ([id number]) [phone].pdf Page 4 of 9 Respiratory Rate(breaths/min): 16 [1:Photos:] [N/A:N/A] Left, Posterior Lower Leg N/A N/A Wound Location: Bump N/A N/A Wounding Event: Diabetic Wound/Ulcer of the Lower N/A N/A Primary Etiology: Extremity Cellulitis N/A N/A Secondary Etiology: Chronic  sinus problems/congestion, N/A N/A Comorbid History: Arrhythmia, Congestive Heart Failure, Coronary Artery Disease, Hypertension, Cirrhosis , Type II Diabetes, Osteoarthritis, Neuropathy, Confinement Anxiety 05/09/2022 N/A N/A Date Acquired: 4 N/A N/A Weeks of Treatment: Open N/A N/A Wound Status: No N/A N/A Wound Recurrence: 0.3x0.3x1 N/A N/A Measurements L x W x D (cm) 0.071 N/A N/A A (cm) : rea 0.071 N/A N/A Volume (cm) : 94.00% N/A N/A % Reduction in A rea: 94.00% N/A N/A % Reduction in Volume: 12 Position 1 (o'clock): 3 Maximum Distance 1 (cm): Yes N/A N/A Tunneling: Grade 2 N/A N/A Classification: Medium N/A N/A Exudate A mount: Serosanguineous N/A N/A Exudate Type: red, brown N/A N/A Exudate Color: Distinct, outline attached N/A N/A Wound Margin: Large (67-100%) N/A N/A Granulation A mount: Pink, Pale N/A N/A Granulation Quality: None Present (0%) N/A N/A Necrotic A mount: Tendon: Yes N/A N/A Exposed Structures: Fascia: No Fat Layer (Subcutaneous Tissue): No Muscle: No Joint: No Bone: No Medium (34-66%) N/A N/A Epithelialization: Excoriation: No N/A N/A Periwound Skin Texture: Induration: No Callus: No Crepitus: No Rash: No Scarring: No Maceration: No N/A N/A Periwound Skin Moisture: Dry/Scaly: No Atrophie Blanche: No N/A N/A Periwound Skin Color: Cyanosis: No Ecchymosis: No Erythema: No Hemosiderin  Staining: No Mottled: No Pallor: No Rubor: No Treatment Notes Wound #1 (Lower Leg) Wound Laterality: Left, Posterior Cleanser Wound Cleanser Discharge Instruction: Cleanse the wound with wound cleanser prior to applying a clean dressing using gauze sponges, not tissue or cotton balls. Peri-Wound Care Skin Prep Discharge Instruction: Use skin prep as directed Topical MAGDALENE, PETZOLDT ([id number]) [id number] Page 5 of 9 Primary Dressing Vashe wet 1/4 packing strip Discharge Instruction: vashe moisten packing strip packed into wound tunnel and wound bed. Secondary Dressing Zetuvit Plus Silicone Border Dressing 4x4 (in/in) Discharge Instruction: Apply silicone border over primary dressing as directed. Secured With Compression Wrap Compression Stockings Facilities manager) Signed: 09/16/2022 12:03:31 PM By: Geralyn Corwin DO Entered By: Geralyn Corwin on 09/16/2022 11:40:53 -------------------------------------------------------------------------------- Multi-Disciplinary Care Plan Details Patient Name: Date of Service: Kayla July C. 09/16/2022 10:45 A M Medical Record Number: [id number] Patient Account Number: [id number] Date of Birth/Sex: Treating RN: 12/16/1945 (77 y.o. Debara Pickett, Yvonne Kendall Primary Care Dayleen Beske: Jacalyn Lefevre Other Clinician: Referring Shareta Fishbaugh: Treating Aqua Denslow/Extender: Lanny Hurst in Treatment: 4 Active Inactive Pain, Acute or Chronic Nursing Diagnoses: Pain, acute or chronic: actual or potential Potential alteration in comfort, pain Goals: Patient will verbalize adequate pain control and receive pain control interventions during procedures as needed Date Initiated: 08/16/2022 Target Resolution Date: 10/07/2022 Goal Status: Active Patient/caregiver will verbalize comfort level met Date Initiated: 08/16/2022 Target Resolution Date: 10/07/2022 Goal Status:  Active Interventions: Encourage patient to take pain medications as prescribed Provide education on pain management Treatment Activities: Administer pain control measures as ordered : 08/16/2022 Notes: Pressure Nursing Diagnoses: Potential for impaired tissue integrity related to pressure, friction, moisture, and shear Goals: Patient will remain free of pressure ulcers Date Initiated: 08/16/2022 Target Resolution Date: 10/06/2022 Goal Status: Active Patient/caregiver will verbalize understanding of pressure ulcer management Date Initiated: 08/16/2022 Target Resolution Date: 10/07/2022 Goal Status: Active Interventions: Assess: immobility, friction, shearing, incontinence upon admission and as needed Assess offloading mechanisms upon admission and as needed LORILEI, BUFORD ([id number]) [phone].pdf Page 6 of 9 Provide education on pressure ulcers Notes: Wound/Skin Impairment Nursing Diagnoses: Knowledge deficit related to ulceration/compromised skin integrity Goals: Patient/caregiver will verbalize understanding of skin care regimen Date Initiated: 08/16/2022 Target Resolution Date: 10/07/2022 Goal Status:  Active Ulcer/skin breakdown will heal within 14 weeks Date Initiated: 08/16/2022 Target Resolution Date: 11/12/2022 Goal Status: Active Interventions: Assess patient/caregiver ability to perform ulcer/skin care regimen upon admission and as needed Assess ulceration(s) every visit Provide education on ulcer and skin care Treatment Activities: Skin care regimen initiated : 08/16/2022 Topical wound management initiated : 08/16/2022 Notes: Electronic Signature(s) Signed: 09/16/2022 4:16:18 PM By: Shawn Stall RN, BSN Entered By: Shawn Stall on 09/16/2022 11:04:07 -------------------------------------------------------------------------------- Pain Assessment Details Patient Name: Date of Service: Kayla July C. 09/16/2022 10:45 A M Medical Record  Number: [id number] Patient Account Number: [id number] Date of Birth/Sex: Treating RN: [birth year] (77 y.o. F) Primary Care Saroya Riccobono: Jacalyn Lefevre Other Clinician: Referring Demoni Gergen: Treating Kadia Abaya/Extender: Lanny Hurst in Treatment: 4 Active Problems Location of Pain Severity and Description of Pain Patient Has Paino Yes Site Locations Rate the pain. Current Pain Level: 3 Pain Management and Medication Current Pain Management: Electronic Signature(s) Signed: 09/16/2022 1:52:56 PM By: Karl Ito Entered By: Karl Ito on 09/16/2022 11:04:29 Ollen Bowl ([id number]) [id number] Page 7 of 9 -------------------------------------------------------------------------------- Patient/Caregiver Education Details Patient Name: Date of Service: [name] 5/10/2024andnbsp10:45 A M Medical Record Number: [id number] Patient Account Number: [id number] Date of Birth/Gender: Treating RN: 03-31-1946 (77 y.o. Arta Silence Primary Care Physician: Jacalyn Lefevre Other Clinician: Referring Physician: Treating Physician/Extender: Lanny Hurst in Treatment: 4 Education Assessment Education Provided To: Patient Education Topics Provided Wound/Skin Impairment: Handouts: Caring for Your Ulcer Methods: Explain/Verbal Responses: Reinforcements needed Electronic Signature(s) Signed: 09/16/2022 4:16:18 PM By: Shawn Stall RN, BSN Entered By: Shawn Stall on 09/16/2022 11:04:23 -------------------------------------------------------------------------------- Wound Assessment Details Patient Name: Date of Service: Kayla July C. 09/16/2022 10:45 A M Medical Record Number: [id number] Patient Account Number: [id number] Date of Birth/Sex: Treating RN: [birth year] (77 y.o. F) Primary Care Jazel Nimmons: Jacalyn Lefevre Other Clinician: Referring Rebekha Diveley: Treating Jayni Prescher/Extender:  Lanny Hurst in Treatment: 4 Wound Status Wound Number: 1 Primary Diabetic Wound/Ulcer of the Lower Extremity Etiology: Wound Location: Left, Posterior Lower Leg Secondary Cellulitis Wounding Event: Bump Etiology: Date Acquired: 05/09/2022 Wound Open Weeks Of Treatment: 4 Status: Clustered Wound: No Comorbid Chronic sinus problems/congestion, Arrhythmia, Congestive Heart History: Failure, Coronary Artery Disease, Hypertension, Cirrhosis , Type II Diabetes, Osteoarthritis, Neuropathy, Confinement Anxiety Photos Wound Measurements Length: (cm) 0. Width: (cm) 0. Depth: (cm) 1 Area: (cm) 0 Volume: (cm) 0 Souders, Lashai C ([id number]) 3 % Reduction in Area: 94% 3 % Reduction in Volume: 94% Epithelialization: Medium (34-66%) .071 Tunneling: Yes .071 Position (o'clock): 12 Maximum Distance: (cm) 3 [id number] Page 8 of 9 Undermining: No Wound Description Classification: Grade 2 Wound Margin: Distinct, outline attached Exudate Amount: Medium Exudate Type: Serosanguineous Exudate Color: red, brown Foul Odor After Cleansing: No Slough/Fibrino No Wound Bed Granulation Amount: Large (67-100%) Exposed Structure Granulation Quality: Pink, Pale Fascia Exposed: No Necrotic Amount: None Present (0%) Fat Layer (Subcutaneous Tissue) Exposed: No Tendon Exposed: Yes Muscle Exposed: No Joint Exposed: No Bone Exposed: No Periwound Skin Texture Texture Color No Abnormalities Noted: No No Abnormalities Noted: No Callus: No Atrophie Blanche: No Crepitus: No Cyanosis: No Excoriation: No Ecchymosis: No Induration: No Erythema: No Rash: No Hemosiderin Staining: No Scarring: No Mottled: No Pallor: No Moisture Rubor: No No Abnormalities Noted: No Dry / Scaly: No Maceration: No Treatment Notes Wound #1 (Lower Leg) Wound Laterality: Left, Posterior Cleanser Wound Cleanser Discharge Instruction: Cleanse the wound with  wound cleanser  prior to applying a clean dressing using gauze sponges, not tissue or cotton balls. Peri-Wound Care Skin Prep Discharge Instruction: Use skin prep as directed Topical Primary Dressing Vashe wet 1/4 packing strip Discharge Instruction: vashe moisten packing strip packed into wound tunnel and wound bed. Secondary Dressing Zetuvit Plus Silicone Border Dressing 4x4 (in/in) Discharge Instruction: Apply silicone border over primary dressing as directed. Secured With Compression Wrap Compression Stockings Facilities manager) Signed: 09/16/2022 4:16:18 PM By: Shawn Stall RN, BSN Entered By: Shawn Stall on 09/16/2022 11:18:56 -------------------------------------------------------------------------------- Vitals Details Patient Name: Date of Service: Kayla July C. 09/16/2022 10:45 A M Medical Record Number: [id number] Patient Account Number: [id number] Date of Birth/Sex: Treating RN: 10-29-1945 (77 y.o. F) Primary Care Roshanna Cimino: Jacalyn Lefevre Other Clinician: Ollen Bowl ([id number]) [id number] Page 9 of 9 Referring Shaianne Nucci: Treating Kiaya Haliburton/Extender: Lanny Hurst in Treatment: 4 Vital Signs Time Taken: 11:03 Temperature (F): 98.7 Height (in): 63 Pulse (bpm): 67 Weight (lbs): 120 Respiratory Rate (breaths/min): 16 Body Mass Index (BMI): 21.3 Blood Pressure (mmHg): 127/63 Reference Range: 80 - 120 mg / dl Electronic Signature(s) Signed: 09/16/2022 1:52:56 PM By: Karl Ito Entered By: Karl Ito on 09/16/2022 11:04:14

## 2022-09-19 ENCOUNTER — Other Ambulatory Visit: Payer: Self-pay

## 2022-09-19 DIAGNOSIS — I130 Hypertensive heart and chronic kidney disease with heart failure and stage 1 through stage 4 chronic kidney disease, or unspecified chronic kidney disease: Secondary | ICD-10-CM | POA: Diagnosis not present

## 2022-09-19 DIAGNOSIS — E1122 Type 2 diabetes mellitus with diabetic chronic kidney disease: Secondary | ICD-10-CM | POA: Diagnosis not present

## 2022-09-19 DIAGNOSIS — N1831 Chronic kidney disease, stage 3a: Secondary | ICD-10-CM | POA: Diagnosis not present

## 2022-09-19 DIAGNOSIS — L89893 Pressure ulcer of other site, stage 3: Secondary | ICD-10-CM | POA: Diagnosis not present

## 2022-09-19 DIAGNOSIS — E1159 Type 2 diabetes mellitus with other circulatory complications: Secondary | ICD-10-CM

## 2022-09-19 DIAGNOSIS — L03116 Cellulitis of left lower limb: Secondary | ICD-10-CM | POA: Diagnosis not present

## 2022-09-19 DIAGNOSIS — I5033 Acute on chronic diastolic (congestive) heart failure: Secondary | ICD-10-CM | POA: Diagnosis not present

## 2022-09-19 MED ORDER — INSULIN LISPRO 100 UNIT/ML IJ SOLN
0.0000 [IU] | INTRAMUSCULAR | 0 refills | Status: DC
Start: 2022-09-19 — End: 2022-09-29

## 2022-09-20 ENCOUNTER — Telehealth: Payer: Self-pay

## 2022-09-20 NOTE — Telephone Encounter (Signed)
She has [place]; will continue that

## 2022-09-20 NOTE — Telephone Encounter (Signed)
Patient's daughter [name] called stating that patient's  insulin lispro (humalog) is not being covered by insurance she would like to know if there is another insulin she can take.  Message sent to Dr. Jacalyn Lefevre

## 2022-09-21 DIAGNOSIS — E1122 Type 2 diabetes mellitus with diabetic chronic kidney disease: Secondary | ICD-10-CM | POA: Diagnosis not present

## 2022-09-21 DIAGNOSIS — I130 Hypertensive heart and chronic kidney disease with heart failure and stage 1 through stage 4 chronic kidney disease, or unspecified chronic kidney disease: Secondary | ICD-10-CM | POA: Diagnosis not present

## 2022-09-21 DIAGNOSIS — L89893 Pressure ulcer of other site, stage 3: Secondary | ICD-10-CM | POA: Diagnosis not present

## 2022-09-21 DIAGNOSIS — L03116 Cellulitis of left lower limb: Secondary | ICD-10-CM | POA: Diagnosis not present

## 2022-09-21 DIAGNOSIS — N1831 Chronic kidney disease, stage 3a: Secondary | ICD-10-CM | POA: Diagnosis not present

## 2022-09-21 DIAGNOSIS — I5033 Acute on chronic diastolic (congestive) heart failure: Secondary | ICD-10-CM | POA: Diagnosis not present

## 2022-09-21 NOTE — Telephone Encounter (Signed)
We could sub: apidra,                                   Apidra humulinR, NovolinR  See which one can be covered

## 2022-09-22 ENCOUNTER — Other Ambulatory Visit: Payer: Self-pay

## 2022-09-22 ENCOUNTER — Telehealth: Payer: Self-pay | Admitting: *Deleted

## 2022-09-22 ENCOUNTER — Emergency Department (HOSPITAL_BASED_OUTPATIENT_CLINIC_OR_DEPARTMENT_OTHER)
Admission: EM | Admit: 2022-09-22 | Discharge: 2022-09-22 | Disposition: A | Discharge status: Home / Self Care | Payer: Medicare Other | Attending: Emergency Medicine | Admitting: Emergency Medicine

## 2022-09-22 ENCOUNTER — Emergency Department (HOSPITAL_BASED_OUTPATIENT_CLINIC_OR_DEPARTMENT_OTHER): Payer: Medicare Other | Admitting: Radiology

## 2022-09-22 ENCOUNTER — Encounter (HOSPITAL_BASED_OUTPATIENT_CLINIC_OR_DEPARTMENT_OTHER): Payer: Self-pay | Admitting: Emergency Medicine

## 2022-09-22 ENCOUNTER — Ambulatory Visit: Payer: Self-pay

## 2022-09-22 DIAGNOSIS — S2241XA Multiple fractures of ribs, right side, initial encounter for closed fracture: Secondary | ICD-10-CM | POA: Diagnosis not present

## 2022-09-22 DIAGNOSIS — Z794 Long term (current) use of insulin: Secondary | ICD-10-CM | POA: Diagnosis not present

## 2022-09-22 DIAGNOSIS — Y92003 Bedroom of unspecified non-institutional (private) residence as the place of occurrence of the external cause: Secondary | ICD-10-CM | POA: Diagnosis not present

## 2022-09-22 DIAGNOSIS — S40021A Contusion of right upper arm, initial encounter: Secondary | ICD-10-CM | POA: Insufficient documentation

## 2022-09-22 DIAGNOSIS — W1830XA Fall on same level, unspecified, initial encounter: Secondary | ICD-10-CM | POA: Insufficient documentation

## 2022-09-22 DIAGNOSIS — W19XXXA Unspecified fall, initial encounter: Secondary | ICD-10-CM

## 2022-09-22 DIAGNOSIS — R030 Elevated blood-pressure reading, without diagnosis of hypertension: Secondary | ICD-10-CM | POA: Insufficient documentation

## 2022-09-22 DIAGNOSIS — M79601 Pain in right arm: Secondary | ICD-10-CM | POA: Diagnosis not present

## 2022-09-22 DIAGNOSIS — Z91040 Latex allergy status: Secondary | ICD-10-CM | POA: Insufficient documentation

## 2022-09-22 DIAGNOSIS — M25511 Pain in right shoulder: Secondary | ICD-10-CM | POA: Diagnosis not present

## 2022-09-22 DIAGNOSIS — I10 Essential (primary) hypertension: Secondary | ICD-10-CM | POA: Diagnosis not present

## 2022-09-22 DIAGNOSIS — S20211A Contusion of right front wall of thorax, initial encounter: Secondary | ICD-10-CM | POA: Diagnosis not present

## 2022-09-22 DIAGNOSIS — R0781 Pleurodynia: Secondary | ICD-10-CM | POA: Diagnosis not present

## 2022-09-22 MED ORDER — METHOCARBAMOL 500 MG PO TABS: 500.0000 mg | ORAL_TABLET | Freq: Three times a day (TID) | ORAL | 0 refills | Status: DC | PRN

## 2022-09-22 MED ORDER — HYDROCODONE-ACETAMINOPHEN 5-325 MG PO TABS
1.0000 | ORAL_TABLET | Freq: Once | ORAL | Status: AC
  Administered 2022-09-22: 1 via ORAL
  Filled 2022-09-22: qty 1

## 2022-09-22 MED ORDER — ACETAMINOPHEN 325 MG PO TABS
650.0000 mg | ORAL_TABLET | Freq: Once | ORAL | Status: AC
  Administered 2022-09-22: 650 mg via ORAL
  Filled 2022-09-22: qty 2

## 2022-09-22 NOTE — ED Triage Notes (Signed)
Pt here from home with c/o fall this morning no loc , c/o right shoulder , arm and rib cage area pain , no blood thinners

## 2022-09-22 NOTE — Telephone Encounter (Signed)
Carney Bern, ST with [facility] called requesting verbal order for ST 1X6weeks.   Verbal orders given.

## 2022-09-22 NOTE — Patient Instructions (Signed)
Visit Information  Thank you for taking time to visit with me today. Please don't hesitate to contact me if I can be of assistance to you.   Following are the goals we discussed today:   Goals Addressed             This Visit's Progress    To stay healthy and out of the hospital       Care Coordination Interventions: Evaluation of current treatment plan related to recent hospital discharge following acute lower UTI and patient's adherence to plan as established by provider Determined patient experienced a fall last evening in her home, she denies hitting her head, although she reports injuring her right shoulder, patient declines to go to the emergency room for evaluation  Spoke with patient's son who advised patient is cognitively alert without noticeable bruising or swelling to the injured shoulder Reviewed and discussed recent discharge instructions with son Chanetta Marshall to include resumption of home health including SNV and PT Determined son Chanetta Marshall is unsure of the next scheduled home visit Educated son regarding the importance of keeping patient well hydrated with water, educated to aim for 48-64 oz daily and to take full course of antibiotics as prescribed  Assessed for nutritional status, son reports patient has a good appetite, she is drinking premiere protein shakes 3 times daily and eating regular meals at mealtime Determined patient's daughter is helping manage her left calf wound, son reports the wound is healing, noted patient completed a wound care visit on 09/16/22 Discussed with son, this RN will contact the patient's PCP to report the recent fall, discussed this RN will contact [facility] to request a nurse visit  Placed outbound call to [facility], spoke with assigned nurse Robb Matar, advised of patient's recent fall and potential reoccurring UTI  Determined patient's next scheduled SNV is set for Monday, discussed contacting patient's PCP to request a prn  visit  Placed outbound call to PCP office, left detailed message for triage nurse advising of patient's fall and request for prn nurse visit and urine specimen for UA, and C&S, provided the contact number for assigned nurse Robb Matar with [facility] as well as the fax number Received an inbound call from nurse Robb Matar advising she spoke with patient's daughter [name] who advised she plans to take the patient back to [facility] ED for evaluation due to patient is now reporting low back pain and has developed a hematoma on her lower extremity        Our next appointment is by telephone on 09/29/22 at 10:00 AM  Please call the care guide team at [phone] if you need to cancel or reschedule your appointment.   If you are experiencing a Mental Health or Behavioral Health Crisis or need someone to talk to, please call 1-800-273-TALK (toll free, 24 hour hotline) go to [facility] [address], [place] [phone])  Patient verbalizes understanding of instructions and care plan provided today and agrees to view in MyChart. Active MyChart status and patient understanding of how to access instructions and care plan via MyChart confirmed with patient.     Delsa Sale, RN, BSN, CCM Care Management Coordinator [facility] Care Management  Direct Phone: [phone]

## 2022-09-22 NOTE — Discharge Instructions (Signed)
It was our pleasure to provide your ER care today - we hope that you feel better.  Fall precautions - use great care/caution, and walker or assistance when up and about.   Take acetaminophen or ibuprofen as need for pain. You may also take robaxin as need for pain - no driving when taking.   Take full and deep breaths, stay active. Use incentive spirometer, 10 full and complete breaths in every hour while awake.   Return to ER if worse, new symptoms, new/severe pain, trouble breathing, fevers, or other concern.

## 2022-09-22 NOTE — Patient Outreach (Signed)
Care Coordination   Initial Visit Note   09/22/2022 Name: [name] MRN: [id number] DOB: 30-Dec-1945  [name] is a 77 y.o. year old female who sees Frederica Kuster, MD for primary care. I spoke with patient and son [name] by phone today.  What matters to the patients health and wellness today?  Patient's family would like to have her stay health and out of the hospital.     Goals Addressed             This Visit's Progress    To stay healthy and out of the hospital       Care Coordination Interventions: Evaluation of current treatment plan related to recent hospital discharge following acute lower UTI and patient's adherence to plan as established by provider Determined patient experienced a fall last evening in her home, she denies hitting her head, although she reports injuring her right shoulder, patient declines to go to the emergency room for evaluation  Spoke with patient's son who advised patient is cognitively alert without noticeable bruising or swelling to the injured shoulder Reviewed and discussed recent discharge instructions with son [name] to include resumption of home health including SNV and PT Determined son [name] is unsure of the next scheduled home visit Educated son regarding the importance of keeping patient well hydrated with water, educated to aim for 48-64 oz daily and to take full course of antibiotics as prescribed  Assessed for nutritional status, son reports patient has a good appetite, she is drinking premiere protein shakes 3 times daily and eating regular meals at mealtime Determined patient's daughter is helping manage her left calf wound, son reports the wound is healing, noted patient completed a wound care visit on 09/16/22 Discussed with son, this RN will contact the patient's PCP to report the recent fall, discussed this RN will contact [facility] to request a nurse visit  Placed outbound call to [facility], spoke with assigned nurse Robb Matar, advised of patient's recent fall and potential reoccurring UTI  Determined patient's next scheduled SNV is set for Monday, discussed contacting patient's PCP to request a prn visit  Placed outbound call to PCP office, left detailed message for triage nurse advising of patient's fall and request for prn nurse visit and urine specimen for UA, and C&S, provided the contact number for assigned nurse Robb Matar with [facility] as well as the fax number Received an inbound call from nurse Robb Matar advising she spoke with patient's daughter [name] who advised she plans to take the patient back to [facility] ED for evaluation due to patient is now reporting low back pain and has developed a hematoma on her lower extremity       Interventions Today    Flowsheet Row Most Recent Value  Chronic Disease   Chronic disease during today's visit Other  [s/p UTI, fall]  General Interventions   General Interventions Discussed/Reviewed General Interventions Discussed, General Interventions Reviewed, Doctor Visits, Communication with  Doctor Visits Discussed/Reviewed Doctor Visits Discussed, Doctor Visits Reviewed, PCP  Communication with PCP/Specialists  [facility] RN, Robb Matar, [facility]]  Education Interventions   Education Provided Provided Education  Provided Verbal Education On Nutrition  Nutrition Interventions   Nutrition Discussed/Reviewed Nutrition Discussed, Nutrition Reviewed, Fluid intake, Increaing proteins  Pharmacy Interventions   Pharmacy Dicussed/Reviewed Pharmacy Topics Discussed, Pharmacy Topics Reviewed, Medications and their functions  Safety Interventions   Safety Discussed/Reviewed Safety Discussed, Safety Reviewed, Fall Risk,  Home Safety  Home Safety Assistive Devices, Contact home health agency          SDOH assessments and interventions completed:  No     Care Coordination Interventions:  Yes,  provided   Follow up plan: Follow up call scheduled for 09/29/22 @10 :00 AM    Encounter Outcome:  Pt. Visit Completed

## 2022-09-22 NOTE — Telephone Encounter (Signed)
Angel Little with [facility] called and stated that she spoke with son and patient today and they stated that patient fell again yesterday. No injury and did not hit head. Complains of Right shoulder pain but no swelling or bruising.   Nurse is wanting to know if Verbal orders can be placed for Skilled Nursing PRN and to obtain a U/A & Culture.   Patient is already receiving services from [facility] and nurse is Robb Matar [phone]  Please Advise. (Message forwarded to Dinah due to Dr Hyacinth Meeker out of office)

## 2022-09-22 NOTE — Telephone Encounter (Signed)
Okay to give verbal orders as requested.Recommend right shoulder X-ray if still having pain.

## 2022-09-22 NOTE — ED Provider Notes (Signed)
[place] EMERGENCY DEPARTMENT AT [facility] Provider Note   CSN: [id number] Arrival date & time: 09/22/22  1759     History  Chief Complaint  Patient presents with   Fall   contusion chest wall   contusion arm    [name] is a 77 y.o. female.  Pt c/o mechanical fall this AM. No loc. Larey Seat forward and to right side in bedroom. No faintness or dizziness prior to fall. C/o pain/contusion to right upper arm and right ribs laterally. No headache. No neck or back pain. No sob. No abd pain or nv. No other extremity pain or injury. No anticoagulant use. Felt normal/fine when got up today, no acute illness or other symptoms. No fever or chills.   The history is provided by the patient and the spouse.       Home Medications Prior to Admission medications   Medication Sig Start Date End Date Taking? Authorizing Provider  methocarbamol (ROBAXIN) 500 MG tablet Take 1 tablet (500 mg total) by mouth every 8 (eight) hours as needed (muscle spasm/pain). 09/22/22  Yes Cathren Laine, MD  acetaminophen (TYLENOL) 500 MG tablet Take 1,000 mg by mouth every 6 (six) hours as needed for moderate pain.    [provider]  artificial tears (LACRILUBE) OINT ophthalmic ointment Place into both eyes at bedtime. 09/09/22   Barnetta Chapel, MD  Calcium Carb-Cholecalciferol (CALCIUM 600 + D PO) Take 1 tablet by mouth in the morning and at bedtime.    [provider]  Cholecalciferol (VITAMIN D-3) 25 MCG (1000 UT) CAPS Take 1,000 Units by mouth daily.    [provider]  cyanocobalamin (VITAMIN B12) 1000 MCG tablet Take 1,000 mcg by mouth daily.    [provider]  ezetimibe (ZETIA) 10 MG tablet Take 1 tablet (10 mg total) by mouth daily. 02/09/22   Frederica Kuster, MD  fish oil-omega-3 fatty acids 1000 MG capsule Take 1 g by mouth daily.    [provider]  furosemide (LASIX) 20 MG tablet Take 40 mg by mouth daily.    [provider]  glucose blood test strip One Touch Ultra Blue test strips. Use to test blood sugar three times daily. Dx: E11.65 02/09/22   Frederica Kuster, MD  Insulin Glargine [facility]) 100 UNIT/ML Inject 30 Units into the skin daily. 09/09/22   Berton Mount I, MD  insulin lispro (HUMALOG) 100 UNIT/ML injection Inject 0-0.12 mLs (0-12 Units total) into the skin See admin instructions. Before each meal 3 times a day, 140-199 - 2 units, 200-250 - 4 units, 251-299 - 6 units,  300-349 - 8 units,  350 or above 10 units. 09/19/22   Frederica Kuster, MD  Insulin Pen Needle (B-D ULTRAFINE III SHORT PEN) 31G X 8 MM MISC Use to give Insulin injections daily. Dx: E11.65 10/11/21   Frederica Kuster, MD  Insulin Syringe-Needle U-100 (INSULIN SYRINGE 1CC/31GX5/16") 31G X 5/16" 1 ML MISC USE AS DIRECTED TO INJECT LEVIMIR 07/29/20   Frederica Kuster, MD  lactulose (CHRONULAC) 10 GM/15ML solution TAKE 30 ML BY MOUTH EVERY DAY Patient taking differently: Take 20 g by mouth daily. 08/23/22   Lynann Bologna, MD  Lancets [facility] LANCET) MISC 1 Device by Does not apply route in the morning and at bedtime. 09/14/22   Frederica Kuster, MD  MAGNESIUM PO Take 1 tablet by mouth at bedtime.    [provider]  nystatin cream (MYCOSTATIN) Apply 1 Application  topically 2 (two) times daily.    [provider]  pantoprazole (PROTONIX) 40 MG tablet TAKE 1 TABLET(40 MG) BY MOUTH DAILY Patient taking differently: Take 40 mg by mouth daily. 10/25/21   Frederica Kuster, MD  potassium chloride SA (KLOR-CON M) 20 MEQ tablet Take 1 tablet (20 mEq total) by mouth daily for 10 days. 09/14/22 09/24/22  Frederica Kuster, MD  rifaximin (XIFAXAN) 550 MG TABS tablet Take 550 mg by mouth 2 (two) times daily.    [provider]  senna-docusate (SENOKOT-S) 8.6-50 MG tablet Take 1 tablet by mouth at bedtime as needed for moderate constipation. 09/09/22 10/09/22  Barnetta Chapel, MD      Allergies    Kiwi  extract, Gabapentin, Tdap [tetanus-diphth-acell pertussis], Latex, Statins, and Tramadol    Review of Systems   Review of Systems  Constitutional:  Negative for chills and fever.  HENT:  Negative for nosebleeds.   Eyes:  Negative for visual disturbance.  Respiratory:  Negative for shortness of breath.   Cardiovascular:  Negative for chest pain.  Gastrointestinal:  Negative for abdominal pain, nausea and vomiting.  Genitourinary:  Negative for flank pain.  Musculoskeletal:  Negative for back pain and neck pain.  Skin:  Negative for wound.  Neurological:  Negative for weakness, numbness and headaches.  Hematological:  Does not bruise/bleed easily.  Psychiatric/Behavioral:  Negative for confusion.     Physical Exam Updated Vital Signs BP (!) 148/66   Pulse 68   Temp 98.7 F (37.1 C) (Oral)   Resp 17   LMP  (LMP Unknown)   SpO2 98%  Physical Exam Vitals and nursing note reviewed.  Constitutional:      Appearance: Normal appearance. She is well-developed.  HENT:     Head: Atraumatic.     Nose: Nose normal.     Mouth/Throat:     Mouth: Mucous membranes are moist.  Eyes:     General: No scleral icterus.    Conjunctiva/sclera: Conjunctivae normal.     Pupils: Pupils are equal, round, and reactive to light.  Neck:     Vascular: No carotid bruit.     Trachea: No tracheal deviation.  Cardiovascular:     Rate and Rhythm: Normal rate and regular rhythm.     Pulses: Normal pulses.     Heart sounds: Normal heart sounds. No murmur heard.    No friction rub. No gallop.  Pulmonary:     Effort: Pulmonary effort is normal. No respiratory distress.     Breath sounds: Normal breath sounds.     Comments: Right lateral chest wall tenderness. Normal chest wall movement.  Abdominal:     General: Bowel sounds are normal. There is no distension.     Palpations: Abdomen is soft.     Tenderness: There is no abdominal tenderness. There is no guarding.     Comments: No abd contusion, pain or  tenderness.   Genitourinary:    Comments: No cva tenderness.  Musculoskeletal:        General: No swelling.     Cervical back: Normal range of motion and neck supple. No rigidity or tenderness. No muscular tenderness.     Comments: CTLS spine, non tender, aligned, no step off. Good rom bil extremities without pain or focal bony tenderness. Contusion/bruise to right upper arm. Compartments v soft. No other focal bony tenderness.   Skin:    General: Skin is warm and dry.     Findings: No rash.  Neurological:  Mental Status: She is alert.     Comments: Alert, speech normal. Motor/sens grossly intact bil.   Psychiatric:        Mood and Affect: Mood normal.     ED Results / Procedures / Treatments   Labs (all labs ordered are listed, but only abnormal results are displayed) Labs Reviewed - No data to display  EKG None  Radiology DG Ribs Unilateral W/Chest Right  Result Date: 09/22/2022 CLINICAL DATA:  fall /pain EXAM: RIGHT RIBS AND CHEST - 3+ VIEW COMPARISON:  Chest x-ray 09/03/2022, CT chest 08/21/2022 FINDINGS: The heart and mediastinal contours are unchanged. Aortic calcification. No focal consolidation. Chronic coarsened interstitial markings. No pleural effusion. No pneumothorax. Likely chronic nondisplaced lateral of eighth rib fracture. Likely chronic nondisplaced lateral seventh rib fracture. Age-indeterminate, likely acute, displaced posterior seventh and eighth rib fractures. Sternotomy wires are intact. Tips noted.  Vascular coiling noted in the left upper quadrant. IMPRESSION: 1. No acute cardiopulmonary abnormality. 2. Age-indeterminate, likely acute, displaced posterior seventh and eighth rib fractures. Possibly chronic nondisplaced lateral seventh and eighth rib fractures. Correlate with point tenderness to palpation to evaluate for an acute component. Electronically Signed   By: Tish Frederickson M.D.   On: 09/22/2022 19:15   DG Humerus Right  Result Date:  09/22/2022 CLINICAL DATA:  fall /pain EXAM: RIGHT HUMERUS - 2+ VIEW COMPARISON:  None Available. FINDINGS: There is no evidence of fracture or other focal bone lesions. Elbow grossly unremarkable. Please see separately dictated x-ray right shoulder 09/22/2022. Mild subcutaneus soft tissue edema of the proximal arm. IMPRESSION: No acute displaced fracture or dislocation. Electronically Signed   By: Tish Frederickson M.D.   On: 09/22/2022 19:09   DG Shoulder Right  Result Date: 09/22/2022 CLINICAL DATA:  fall /pain EXAM: RIGHT SHOULDER - 2+ VIEW COMPARISON:  None Available. FINDINGS: There is no evidence of fracture or dislocation. At least moderate degenerative changes of the right glenohumeral and acromioclavicular joints. Soft tissues are unremarkable. Question cortical irregularity of the right mid CP x-ray chest and right ribs 09/22/2022. IMPRESSION: No acute displaced fracture or dislocation. Electronically Signed   By: Tish Frederickson M.D.   On: 09/22/2022 19:06    Procedures Procedures    Medications Ordered in ED Medications  acetaminophen (TYLENOL) tablet 650 mg (650 mg Oral Given 09/22/22 1943)  HYDROcodone-acetaminophen (NORCO/VICODIN) 5-325 MG per tablet 1 tablet (1 tablet Oral Given 09/22/22 1943)    ED Course/ Medical Decision Making/ A&P                             Medical Decision Making Problems Addressed: Accidental fall, initial encounter: acute illness or injury with systemic symptoms that poses a threat to life or bodily functions Closed fracture of multiple ribs of right side, initial encounter: acute illness or injury with systemic symptoms that poses a threat to life or bodily functions Contusion of multiple sites of right upper arm, initial encounter: acute illness or injury Elevated blood pressure reading: acute illness or injury  Amount and/or Complexity of Data Reviewed Independent Historian:     Details: Family, hx External Data Reviewed: notes. Radiology:  ordered and independent interpretation performed. Decision-making details documented in ED Course.  Risk OTC drugs. Prescription drug management.   Xrays.  Reviewed nursing notes and prior charts for additional history.   Xrays reviewed/interpreted by me - right rib fx x 2.   Hydrocodone po. Acetaminophen po.  Pt appears comfortable. No  distress.   Pt appears stable for d/c.  Incentive spirometry. Pcp f/u.  Return precautions provided.           Final Clinical Impression(s) / ED Diagnoses Final diagnoses:  Accidental fall, initial encounter  Contusion of multiple sites of right upper arm, initial encounter  Closed fracture of multiple ribs of right side, initial encounter  Elevated blood pressure reading    Rx / DC Orders ED Discharge Orders          Ordered    methocarbamol (ROBAXIN) 500 MG tablet  Every 8 hours PRN        09/22/22 2029              Cathren Laine, MD 09/22/22 2031

## 2022-09-23 ENCOUNTER — Ambulatory Visit: Payer: Self-pay

## 2022-09-23 ENCOUNTER — Telehealth: Payer: Self-pay

## 2022-09-23 NOTE — Telephone Encounter (Signed)
[name] with [facility] notified and agreed.  Patient was seen in ER yesterday but nurse is still requesting verbal orders.   Called and spoke with Robb Matar and Verbal orders given.

## 2022-09-23 NOTE — Telephone Encounter (Signed)
Incoming fax received stating pharmacy claim for humalog has been rejected. . Provider has 2 options: select an alternative or complete a prior authorization.   I called patient and she prefers that Dr.Miller select an alternative

## 2022-09-23 NOTE — Telephone Encounter (Signed)
Okay.please forward to Dr.Miller.

## 2022-09-23 NOTE — Patient Outreach (Addendum)
  Care Coordination   Follow Up Visit Note   09/23/2022 Name: Tiann Lafont MRN: [id number] DOB: 12-Jan-1946  Levora Dredge Stansberry is a 77 y.o. year old female who sees Frederica Kuster, MD for primary care. I collaborated with Synetta Fail from [facility] regarding orders for [facility] and urine specimen.   What matters to the patients health and wellness today?      Goals Addressed             This Visit's Progress    To stay healthy and out of the hospital       Care Coordination Interventions: Received inbound call from nurse Synetta Fail from Dr. Rondel Baton office advising orders have been received for additional skilled nurse visits as well as urine for UA and C&S Informed Synetta Fail, this patient's family took her into the ED at [facility] location for evaluation, reviewed and discussed the patient was discharged home with orders for Robaxin Discussed that Synetta Fail will call Robb Matar RN with [facility] to provide orders for additional skilled nurse visits and urine specimen  for UA and C&S    Interventions Today    Flowsheet Row Most Recent Value  Chronic Disease   Chronic disease during today's visit Other  General Interventions   General Interventions Discussed/Reviewed Communication with  Communication with RN  Synetta Fail for [facility]]          SDOH assessments and interventions completed:  No     Care Coordination Interventions:  Yes, provided   Follow up plan: Follow up call scheduled for 09/29/22 @10 :00 AM    Encounter Outcome:  Pt. Visit Completed

## 2022-09-26 ENCOUNTER — Other Ambulatory Visit: Payer: Self-pay

## 2022-09-26 ENCOUNTER — Ambulatory Visit (HOSPITAL_BASED_OUTPATIENT_CLINIC_OR_DEPARTMENT_OTHER): Payer: 59 | Admitting: Internal Medicine

## 2022-09-26 ENCOUNTER — Encounter (HOSPITAL_COMMUNITY): Payer: Self-pay

## 2022-09-26 ENCOUNTER — Emergency Department (HOSPITAL_COMMUNITY): Payer: Medicare Other

## 2022-09-26 ENCOUNTER — Emergency Department (HOSPITAL_COMMUNITY)
Admission: EM | Admit: 2022-09-26 | Discharge: 2022-09-26 | Disposition: A | Discharge status: Home / Self Care | Payer: Medicare Other | Attending: Emergency Medicine | Admitting: Emergency Medicine

## 2022-09-26 DIAGNOSIS — Z794 Long term (current) use of insulin: Secondary | ICD-10-CM | POA: Insufficient documentation

## 2022-09-26 DIAGNOSIS — Z8616 Personal history of COVID-19: Secondary | ICD-10-CM | POA: Insufficient documentation

## 2022-09-26 DIAGNOSIS — J811 Chronic pulmonary edema: Secondary | ICD-10-CM | POA: Diagnosis not present

## 2022-09-26 DIAGNOSIS — I493 Ventricular premature depolarization: Secondary | ICD-10-CM | POA: Diagnosis not present

## 2022-09-26 DIAGNOSIS — I5032 Chronic diastolic (congestive) heart failure: Secondary | ICD-10-CM | POA: Insufficient documentation

## 2022-09-26 DIAGNOSIS — I130 Hypertensive heart and chronic kidney disease with heart failure and stage 1 through stage 4 chronic kidney disease, or unspecified chronic kidney disease: Secondary | ICD-10-CM | POA: Insufficient documentation

## 2022-09-26 DIAGNOSIS — E1122 Type 2 diabetes mellitus with diabetic chronic kidney disease: Secondary | ICD-10-CM | POA: Insufficient documentation

## 2022-09-26 DIAGNOSIS — I10 Essential (primary) hypertension: Secondary | ICD-10-CM | POA: Diagnosis not present

## 2022-09-26 DIAGNOSIS — Z91040 Latex allergy status: Secondary | ICD-10-CM | POA: Diagnosis not present

## 2022-09-26 DIAGNOSIS — R531 Weakness: Secondary | ICD-10-CM | POA: Diagnosis not present

## 2022-09-26 DIAGNOSIS — W19XXXA Unspecified fall, initial encounter: Secondary | ICD-10-CM | POA: Diagnosis not present

## 2022-09-26 DIAGNOSIS — I480 Paroxysmal atrial fibrillation: Secondary | ICD-10-CM | POA: Diagnosis not present

## 2022-09-26 DIAGNOSIS — N390 Urinary tract infection, site not specified: Secondary | ICD-10-CM | POA: Diagnosis not present

## 2022-09-26 DIAGNOSIS — R5383 Other fatigue: Secondary | ICD-10-CM

## 2022-09-26 DIAGNOSIS — I2510 Atherosclerotic heart disease of native coronary artery without angina pectoris: Secondary | ICD-10-CM | POA: Insufficient documentation

## 2022-09-26 DIAGNOSIS — N181 Chronic kidney disease, stage 1: Secondary | ICD-10-CM | POA: Diagnosis not present

## 2022-09-26 LAB — COMPREHENSIVE METABOLIC PANEL
ALT: 40 U/L (ref 0–44)
AST: 57 U/L — ABNORMAL HIGH (ref 15–41)
Albumin: 2.8 g/dL — ABNORMAL LOW (ref 3.5–5.0)
Alkaline Phosphatase: 102 U/L (ref 38–126)
Anion gap: 10 (ref 5–15)
BUN: 12 mg/dL (ref 8–23)
CO2: 23 mmol/L (ref 22–32)
Calcium: 9.5 mg/dL (ref 8.9–10.3)
Chloride: 102 mmol/L (ref 98–111)
Creatinine, Ser: 0.71 mg/dL (ref 0.44–1.00)
GFR, Estimated: >60 mL/min (ref >60)
Glucose, Bld: 134 mg/dL — ABNORMAL HIGH (ref 70–99)
Potassium: 4 mmol/L (ref 3.5–5.1)
Sodium: 135 mmol/L (ref 135–145)
Total Bilirubin: 1.6 mg/dL — ABNORMAL HIGH (ref 0.3–1.2)
Total Protein: 6.9 g/dL (ref 6.5–8.1)

## 2022-09-26 LAB — URINALYSIS, W/ REFLEX TO CULTURE (INFECTION SUSPECTED)
Bilirubin Urine: NEGATIVE
Glucose, UA: NEGATIVE mg/dL
Ketones, ur: NEGATIVE mg/dL
Nitrite: POSITIVE — AB
Protein, ur: NEGATIVE mg/dL
Specific Gravity, Urine: 1.008 (ref 1.005–1.030)
pH: 7 (ref 5.0–8.0)

## 2022-09-26 LAB — LACTIC ACID, PLASMA: Lactic Acid, Venous: 1.8 mmol/L (ref 0.5–1.9)

## 2022-09-26 LAB — TROPONIN I (HIGH SENSITIVITY)
Troponin I (High Sensitivity): 34 ng/L — ABNORMAL HIGH (ref <18)
Troponin I (High Sensitivity): 36 ng/L — ABNORMAL HIGH (ref <18)

## 2022-09-26 LAB — CBC WITH DIFFERENTIAL/PLATELET
Abs Immature Granulocytes: 0.02 10*3/uL (ref 0.00–0.07)
Basophils Absolute: 0 10*3/uL (ref 0.0–0.1)
Basophils Relative: 1 %
Eosinophils Absolute: 0.2 10*3/uL (ref 0.0–0.5)
Eosinophils Relative: 4 %
HCT: 38.2 % (ref 36.0–46.0)
Hemoglobin: 11.7 g/dL — ABNORMAL LOW (ref 12.0–15.0)
Immature Granulocytes: 1 %
Lymphocytes Relative: 17 %
Lymphs Abs: 0.7 10*3/uL (ref 0.7–4.0)
MCH: 31 pg (ref 26.0–34.0)
MCHC: 30.6 g/dL (ref 30.0–36.0)
MCV: 101.1 fL — ABNORMAL HIGH (ref 80.0–100.0)
Monocytes Absolute: 0.4 10*3/uL (ref 0.1–1.0)
Monocytes Relative: 10 %
Neutro Abs: 2.6 10*3/uL (ref 1.7–7.7)
Neutrophils Relative %: 67 %
Platelets: 101 10*3/uL — ABNORMAL LOW (ref 150–400)
RBC: 3.78 MIL/uL — ABNORMAL LOW (ref 3.87–5.11)
RDW: 15 % (ref 11.5–15.5)
WBC: 3.9 10*3/uL — ABNORMAL LOW (ref 4.0–10.5)
nRBC: 0 % (ref 0.0–0.2)

## 2022-09-26 LAB — CBG MONITORING, ED: Glucose-Capillary: 115 mg/dL — ABNORMAL HIGH (ref 70–99)

## 2022-09-26 LAB — AMMONIA: Ammonia: 37 umol/L — ABNORMAL HIGH (ref 9–35)

## 2022-09-26 MED ORDER — FOSFOMYCIN TROMETHAMINE 3 G PO PACK
3.0000 g | PACK | Freq: Once | ORAL | Status: AC
  Administered 2022-09-26: 3 g via ORAL
  Filled 2022-09-26: qty 3

## 2022-09-26 MED ORDER — FOSFOMYCIN TROMETHAMINE 3 G PO PACK: 3.0000 g | PACK | Freq: Once | ORAL | 0 refills | Status: AC

## 2022-09-26 NOTE — ED Notes (Addendum)
Pt ambulated to restroom with assistance. Pt wasn't able to give urine sample.

## 2022-09-26 NOTE — ED Provider Notes (Signed)
EMERGENCY DEPARTMENT AT [facility] Provider Note   CSN: [id number] Arrival date & time: 09/26/22  0703     History  Chief Complaint  Patient presents with   Fatigue    Estine Couser is a 77 y.o. female.  HPI      77 year old female with a history of Elita Boone cirrhosis status post TIPS, diastolic congestive heart failure, anemia, type 2 diabetes, anxiety, admission in April for subdural/subarachnoid hemorrhage secondary to mechanical fall and ESBL UTI, admission later in April with encephalopathy, fever and Klebsiella ESBL UTI who presents with concern for fatigue and nausea.  Reports she was up last night in the middle of the night and could not sleep because she felt sick.  Reports that she had some shortness of breath, nausea, fatigue.  She does not feel short of breath at this time.  She denies shortness of breath at triage.  Reports an overall feeling of generalized weakness, fatigue, and nausea.  Denies any vomiting, headache, fever, dysuria, abdominal pain, chest pain, diarrhea or constipation.  Denies any recent falls.  She is still having pain from her prior rib fractures.  Denies any change in her cough.   Past Medical History:  Diagnosis Date   Chronic cystitis    Chronic diastolic CHF (congestive heart failure) (HCC)    followed by dr Eden Emms   Chronic kidney disease, stage I    Cirrhosis of liver without ascites (HCC) 03/2016   followed by dr Christella Hartigan (GI);   due to fatty liver;   post op cabg 09/ 2017  , post paracentesis for ascites   Coronary artery disease 01/2016   cardiologist--- dr Estrella Myrtle;   01-27-2016  NSTEMI  s/p cardiac cath;   s/p CABG 01/29/16: LIMA-LAD, SVG-RI, SVG-dRCA   DDD (degenerative disc disease), cervical    per pt w/ chronic neck pain   Dyspnea    occasional   Edema of right lower extremity    Esophageal varices (HCC)    followed by dr Christella Hartigan;   hx multiple banding of large varices   Essential hypertension    no meds    GERD (gastroesophageal reflux disease)    Heart murmur     DX FOR YEARS ASYMPTOMATIC   Hepatic encephalopathy (HCC)    History of adenomatous polyp of colon    History of COVID-19 04/2020   per pt mild  symptoms that resolve   History of kidney stones    passed stones   History of non-ST elevation myocardial infarction (NSTEMI) 01/27/2016   Hyperlipidemia    diet controlled   NAFLD (nonalcoholic fatty liver disease)    Neuropathy, peripheral 04/01/2015   OA (osteoarthritis)    Paroxysmal A-fib (HCC)    08/19/19 afib with RVR, spontaneously converted to SR   Portal vein thrombosis    per dr Christella Hartigan  ,  chronic non-occluded   Thrombocytopenia (HCC)    followed by pcp  and hematology-- dr Mosetta Putt;   likely secondary to liver cirrhosis   Type 2 diabetes mellitus treated with insulin (HCC)    Type 2, followed by pcp    (07-21-2021  per pt checks blood sugar dialy in am fasting average 113--200s)   Urge incontinence of urine    Wears hearing aid in both ears      Home Medications Prior to Admission medications   Medication Sig Start Date End Date Taking? Authorizing Provider  fosfomycin (MONUROL) 3 g PACK Take 3 g by mouth once for  1 dose. Take dose in 3 days or 5/23 (You received first dose in the ED) 09/29/22 09/29/22 Yes Alvira Monday, MD  acetaminophen (TYLENOL) 500 MG tablet Take 1,000 mg by mouth every 6 (six) hours as needed for moderate pain.    [provider]  artificial tears (LACRILUBE) OINT ophthalmic ointment Place into both eyes at bedtime. 09/09/22   Barnetta Chapel, MD  Calcium Carb-Cholecalciferol (CALCIUM 600 + D PO) Take 1 tablet by mouth in the morning and at bedtime.    [provider]  Cholecalciferol (VITAMIN D-3) 25 MCG (1000 UT) CAPS Take 1,000 Units by mouth daily.    [provider]  cyanocobalamin (VITAMIN B12) 1000 MCG tablet Take 1,000 mcg by mouth daily.    [provider]  ezetimibe (ZETIA) 10 MG tablet Take 1 tablet  (10 mg total) by mouth daily. 02/09/22   Frederica Kuster, MD  fish oil-omega-3 fatty acids 1000 MG capsule Take 1 g by mouth daily.    [provider]  furosemide (LASIX) 20 MG tablet Take 40 mg by mouth daily.    [provider]  glucose blood test strip One Touch Ultra Blue test strips. Use to test blood sugar three times daily. Dx: E11.65 02/09/22   Frederica Kuster, MD  Insulin Glargine [facility]) 100 UNIT/ML Inject 30 Units into the skin daily. 09/09/22   Berton Mount I, MD  insulin lispro (HUMALOG) 100 UNIT/ML injection Inject 0-0.12 mLs (0-12 Units total) into the skin See admin instructions. Before each meal 3 times a day, 140-199 - 2 units, 200-250 - 4 units, 251-299 - 6 units,  300-349 - 8 units,  350 or above 10 units. 09/19/22   Frederica Kuster, MD  Insulin Pen Needle (B-D ULTRAFINE III SHORT PEN) 31G X 8 MM MISC Use to give Insulin injections daily. Dx: E11.65 10/11/21   Frederica Kuster, MD  Insulin Syringe-Needle U-100 (INSULIN SYRINGE 1CC/31GX5/16") 31G X 5/16" 1 ML MISC USE AS DIRECTED TO INJECT LEVIMIR 07/29/20   Frederica Kuster, MD  lactulose (CHRONULAC) 10 GM/15ML solution TAKE 30 ML BY MOUTH EVERY DAY Patient taking differently: Take 20 g by mouth daily. 08/23/22   Lynann Bologna, MD  Lancets [facility] LANCET) MISC 1 Device by Does not apply route in the morning and at bedtime. 09/14/22   Frederica Kuster, MD  MAGNESIUM PO Take 1 tablet by mouth at bedtime.    [provider]  methocarbamol (ROBAXIN) 500 MG tablet Take 1 tablet (500 mg total) by mouth every 8 (eight) hours as needed (muscle spasm/pain). 09/22/22   Cathren Laine, MD  nystatin cream (MYCOSTATIN) Apply 1 Application topically 2 (two) times daily.    [provider]  pantoprazole (PROTONIX) 40 MG tablet TAKE 1 TABLET(40 MG) BY MOUTH DAILY Patient taking differently: Take 40 mg by mouth daily. 10/25/21   Frederica Kuster, MD  potassium chloride SA (KLOR-CON M) 20  MEQ tablet Take 1 tablet (20 mEq total) by mouth daily for 10 days. 09/14/22 09/24/22  Frederica Kuster, MD  rifaximin (XIFAXAN) 550 MG TABS tablet Take 550 mg by mouth 2 (two) times daily.    [provider]  senna-docusate (SENOKOT-S) 8.6-50 MG tablet Take 1 tablet by mouth at bedtime as needed for moderate constipation. 09/09/22 10/09/22  Barnetta Chapel, MD      Allergies    Kiwi extract, Gabapentin, Tdap [tetanus-diphth-acell pertussis], Latex, Statins, and Tramadol    Review of Systems  Review of Systems  Physical Exam Updated Vital Signs BP (!) 152/62   Pulse 86   Temp 97.8 F (36.6 C) (Oral)   Resp 18   LMP  (LMP Unknown)   SpO2 98%  Physical Exam Vitals and nursing note reviewed.  Constitutional:      General: She is not in acute distress.    Appearance: She is well-developed. She is not diaphoretic.  HENT:     Head: Normocephalic and atraumatic.  Eyes:     Conjunctiva/sclera: Conjunctivae normal.  Cardiovascular:     Rate and Rhythm: Normal rate and regular rhythm.     Heart sounds: Normal heart sounds. No murmur heard.    No friction rub. No gallop.  Pulmonary:     Effort: Pulmonary effort is normal. No respiratory distress.     Breath sounds: Normal breath sounds. No wheezing or rales.  Chest:     Chest wall: Tenderness (right flank, old hematoma) present.  Abdominal:     General: There is no distension.     Palpations: Abdomen is soft.     Tenderness: There is no abdominal tenderness. There is no guarding.  Musculoskeletal:        General: No tenderness.     Cervical back: Normal range of motion.  Skin:    General: Skin is warm and dry.     Findings: Bruising (right posterior arm, right flank) present. No erythema or rash.     Comments: 1cm sacral ulcer, no surrounding erythema or drainage  Neurological:     Mental Status: She is alert and oriented to person, place, and time.     ED Results / Procedures / Treatments   Labs (all labs ordered  are listed, but only abnormal results are displayed) Labs Reviewed  CBC WITH DIFFERENTIAL/PLATELET - Abnormal; Notable for the following components:      Result Value   WBC 3.9 (*)    RBC 3.78 (*)    Hemoglobin 11.7 (*)    MCV 101.1 (*)    Platelets 101 (*)    All other components within normal limits  COMPREHENSIVE METABOLIC PANEL - Abnormal; Notable for the following components:   Glucose, Bld 134 (*)    Albumin 2.8 (*)    AST 57 (*)    Total Bilirubin 1.6 (*)    All other components within normal limits  URINALYSIS, W/ REFLEX TO CULTURE (INFECTION SUSPECTED) - Abnormal; Notable for the following components:   APPearance HAZY (*)    Hgb urine dipstick SMALL (*)    Nitrite POSITIVE (*)    Leukocytes,Ua SMALL (*)    Bacteria, UA MANY (*)    All other components within normal limits  AMMONIA - Abnormal; Notable for the following components:   Ammonia 37 (*)    All other components within normal limits  CBG MONITORING, ED - Abnormal; Notable for the following components:   Glucose-Capillary 115 (*)    All other components within normal limits  TROPONIN I (HIGH SENSITIVITY) - Abnormal; Notable for the following components:   Troponin I (High Sensitivity) 34 (*)    All other components within normal limits  TROPONIN I (HIGH SENSITIVITY) - Abnormal; Notable for the following components:   Troponin I (High Sensitivity) 36 (*)    All other components within normal limits  CULTURE, BLOOD (ROUTINE X 2)  CULTURE, BLOOD (ROUTINE X 2)  LACTIC ACID, PLASMA    EKG None  Radiology CT Head Wo Contrast  Result Date: 09/26/2022 CLINICAL  DATA:  Follow-up subdural hematoma, weakness EXAM: CT HEAD WITHOUT CONTRAST TECHNIQUE: Contiguous axial images were obtained from the base of the skull through the vertex without intravenous contrast. RADIATION DOSE REDUCTION: This exam was performed according to the departmental dose-optimization program which includes automated exposure control,  adjustment of the mA and/or kV according to patient size and/or use of iterative reconstruction technique. COMPARISON:  09/02/2022 FINDINGS: Brain: No evidence of acute infarction, hemorrhage, hydrocephalus, extra-axial collection or mass lesion/mass effect. Prior minimal subdural hematoma along the falx and left tentorium has resolved. Subcortical white matter and periventricular small vessel ischemic changes. Vascular: Intracranial atherosclerosis. Skull: Normal. Negative for fracture or focal lesion. Sinuses/Orbits: The visualized paranasal sinuses are essentially clear. The mastoid air cells are unopacified. Other: None. IMPRESSION: No acute intracranial abnormality. Prior minimal subdural hematoma along the falx and left tentorium has resolved. Small vessel ischemic changes. Electronically Signed   By: Charline Bills M.D.   On: 09/26/2022 09:22   DG Chest Portable 1 View  Result Date: 09/26/2022 CLINICAL DATA:  Fatigue. EXAM: PORTABLE CHEST 1 VIEW COMPARISON:  09/22/2022 FINDINGS: Heart size appears within normal limits. Status post median sternotomy and CABG procedure. No pleural effusion or edema. Pulmonary vascular congestion. Unchanged appearance right posterior seventh and eighth rib fractures. IMPRESSION: 1. Pulmonary vascular congestion. 2. Unchanged right posterior seventh and eighth rib fractures. Electronically Signed   By: Signa Kell M.D.   On: 09/26/2022 08:24    Procedures Procedures    Medications Ordered in ED Medications  fosfomycin (MONUROL) packet 3 g (3 g Oral Given 09/26/22 1417)    ED Course/ Medical Decision Making/ A&P                              77 year old female with a history of Elita Boone cirrhosis status post TIPS, diastolic congestive heart failure, anemia, type 2 diabetes, anxiety, admission in April for subdural/subarachnoid hemorrhage secondary to mechanical fall and ESBL UTI, admission later in April with encephalopathy, fever and Klebsiella ESBL UTI who  presents with concern for fatigue and nausea.   Differential diagnosis includes worsening of her prior intracranial hemorrhage, infection/repeat UTI, bacteremia, pneumonia, cardiac arrhythmia, ACS, hepatic encephalopathy, anemia, electrolyte abnormalities.  No abdominal pain to suggest intra-abdominal cause or SBP.   CT head was completed and personally read interpreted by me shows prior SDH has resolved.   Chest x-ray completed and personally about interpreted by me shows pulmonary vascular congestion, unchanged right posterior seventh and eighth rib fractures.  Labs completed and personally about interpreted by me shows normal lactic acid,stable ammonia compared to prior, 11.7 hgb, wbc 3.9 stable, stable troponin compared to prior.  Blood cultures were ordered given her history of recent UTIs.  Urinalysis does appear consistent with UTI with positive nitrites, many bacteria, 6-10WBC. Reviewed prior cultures ESBL klebsiella and discussed with pt and pharmacy.  She is hemodynamically stable without signs of significant sepsis.  She and family prefer outpatient treatment. Given fosfomycin in ED, and extra dose to be given in 3 days. Discussed strict return precautions. Patient discharged in stable condition with understanding of reasons to return.           Final Clinical Impression(s) / ED Diagnoses Final diagnoses:  Other fatigue  Urinary tract infection without hematuria, site unspecified  PVC (premature ventricular contraction)    Rx / DC Orders ED Discharge Orders          Ordered  fosfomycin (MONUROL) 3 g PACK   Once        09/26/22 1401    Ambulatory referral to Cardiology        09/26/22 1402              Alvira Monday, MD 09/26/22 2339

## 2022-09-26 NOTE — ED Triage Notes (Signed)
Pt to ED via EMS from home with c/o generalized weakness and not feeling well. Pt denies any CP, SOB or abd pain. Pt reports it started last night. Pt Aox4 in triage NADN.

## 2022-09-28 ENCOUNTER — Ambulatory Visit: Payer: Medicare Other | Admitting: Family Medicine

## 2022-09-28 NOTE — Telephone Encounter (Signed)
I had previously answered this question, saying whatever her insurance will cover:aspartate, lispro, aphidra, Novolog R. I do not know ahead what her insurance covers

## 2022-09-28 NOTE — Telephone Encounter (Signed)
I had previously responded to this question on 5-14 and said whatever insurance will cover including Apidra, aspartate, novolog R

## 2022-09-28 NOTE — Telephone Encounter (Signed)
There is a list within this encounter of what they cover

## 2022-09-28 NOTE — Telephone Encounter (Signed)
Awaiting response from Frederica Kuster, MD

## 2022-09-29 ENCOUNTER — Ambulatory Visit: Payer: Self-pay

## 2022-09-29 ENCOUNTER — Telehealth: Payer: Self-pay | Admitting: *Deleted

## 2022-09-29 DIAGNOSIS — Z794 Long term (current) use of insulin: Secondary | ICD-10-CM

## 2022-09-29 MED ORDER — BD PEN NEEDLE SHORT U/F 31G X 8 MM MISC: 3 refills | Status: DC

## 2022-09-29 MED ORDER — INSULIN LISPRO 100 UNIT/ML IJ SOLN
INTRAMUSCULAR | 0 refills | Status: DC
Start: 2022-09-29 — End: 2022-10-10

## 2022-09-29 NOTE — Patient Instructions (Signed)
Visit Information  Thank you for taking time to visit with me today. Please don't hesitate to contact me if I can be of assistance to you.   Following are the goals we discussed today:   Goals Addressed             This Visit's Progress    To have Ammonia level rechecked       Care Coordination Interventions: Completed successful outbound call with son Lakesha Amaker  Evaluation of current treatment plan related to elevated Ammonia level  and patient's adherence to plan as established by provider Review of patient status, including review of consultant's reports, relevant laboratory and other test results, and medications completed Instructed [name] to contact patient's PCP to scheduled a post ED follow up visit and request having a recheck of patient's Ammonia level        To prevent reoccurring UTI's       Care Coordination Interventions: Assessed the son Benelli Schuelke understanding of chronic kidney disease    Evaluation of current treatment plan related to chronic kidney disease self management and patient's adherence to plan as established by provider      Reviewed most recent prescribed antibiotic with son [name] and discussed importance of medication adherence and importance of completing full course of medicine Reviewed prescribed fluid recommendations with son [name], patient should increase daily water intake to 48-64 oz daily unless otherwise directed  Advised patient to discuss referral for Urology with provider    Assessed social determinant of health barriers    Provided education on kidney disease progression   Instructed son [name] to contact PCP to schedule a follow up visit for a urine recheck          To stay healthy and out of the hospital       Care Coordination Interventions: Completed successful outbound call with son Gere Winward  Provided written and verbal education re: potential causes of falls and Fall prevention strategies Reviewed medications and discussed potential side  effects of medications such as dizziness and frequent urination Advised patient of importance of notifying provider of falls Assessed for signs and symptoms of orthostatic hypotension Assessed for falls since last encounter Assessed patients knowledge of fall risk prevention secondary to previously provided education Advised patient to discuss reoccurring falls  with provider Assessed social determinant of health barriers, sent SW referral to [name] BSW to help education son/family about assisted living options for patient            Our next appointment is by telephone on 10/14/22 at 09:00 AM  Please call the care guide team at [phone] if you need to cancel or reschedule your appointment.   If you are experiencing a Mental Health or Behavioral Health Crisis or need someone to talk to, please call 1-800-273-TALK (toll free, 24 hour hotline) go to [facility] [address], [place] [phone])  Patient verbalizes understanding of instructions and care plan provided today and agrees to view in MyChart. Active MyChart status and patient understanding of how to access instructions and care plan via MyChart confirmed with patient.     Delsa Sale, RN, BSN, CCM Care Management Coordinator [facility] Care Management  Direct Phone: [phone]

## 2022-09-29 NOTE — Patient Outreach (Signed)
Care Coordination   Follow Up Visit Note   09/29/2022 Name: [name] MRN: [id number] DOB: 1945-10-25  [name] is a 77 y.o. year old female who sees Frederica Kuster, MD for primary care. I spoke with son [name] by phone today.  What matters to the patients health and wellness today?  Patient and family would like for patient to have fewer UTI's and no further falls.     Goals Addressed             This Visit's Progress    To have Ammonia level rechecked       Care Coordination Interventions: Completed successful outbound call with son [name]  Evaluation of current treatment plan related to elevated Ammonia level  and patient's adherence to plan as established by provider Review of patient status, including review of consultant's reports, relevant laboratory and other test results, and medications completed Instructed [name] to contact patient's PCP to scheduled a post ED follow up visit and request having a recheck of patient's Ammonia level     To prevent reoccurring UTI's       Care Coordination Interventions: Assessed the son [name] understanding of chronic kidney disease    Evaluation of current treatment plan related to chronic kidney disease self management and patient's adherence to plan as established by provider      Reviewed most recent prescribed antibiotic with son [name] and discussed importance of medication adherence and importance of completing full course of medicine Reviewed prescribed fluid recommendations with son [name], patient should increase daily water intake to 48-64 oz daily unless otherwise directed  Advised patient to discuss referral for Urology with provider    Assessed social determinant of health barriers    Provided education on kidney disease progression   Instructed son [name] to contact PCP to schedule a follow up visit for a urine recheck       To stay healthy and out of the hospital       Care Coordination  Interventions: Completed successful outbound call with son [name]  Provided written and verbal education re: potential causes of falls and Fall prevention strategies Reviewed medications and discussed potential side effects of medications such as dizziness and frequent urination Advised patient of importance of notifying provider of falls Assessed for signs and symptoms of orthostatic hypotension Assessed for falls since last encounter Assessed patients knowledge of fall risk prevention secondary to previously provided education Advised patient to discuss reoccurring falls  with provider Assessed social determinant of health barriers, sent SW referral to [name] BSW to help education son/family about assisted living options for patient     Interventions Today    Flowsheet Row Most Recent Value  Chronic Disease   Chronic disease during today's visit Chronic Kidney Disease/End Stage Renal Disease (ESRD), Other  [chronic UTI's, falls, elevated Ammoniia]  General Interventions   General Interventions Discussed/Reviewed General Interventions Discussed, General Interventions Reviewed, Doctor Visits, Labs, Durable Medical Equipment (DME), Communication with  Doctor Visits Discussed/Reviewed Doctor Visits Reviewed, Doctor Visits Discussed, PCP, Specialist  Durable Medical Equipment (DME) Dan Humphreys, BP Cuff  Communication with Social Work, PCP/Specialists  Exercise Interventions   Exercise Discussed/Reviewed Physical Activity  Physical Activity Discussed/Reviewed Physical Activity Reviewed, Physical Activity Discussed  Education Interventions   Education Provided Provided Education  Provided Verbal Education On Labs, Medication, When to see the doctor  Labs Reviewed Kidney Function  [Ammonia level]  Nutrition Interventions   Nutrition Discussed/Reviewed Fluid intake  Pharmacy Interventions   Pharmacy Dicussed/Reviewed Pharmacy Topics Discussed, Pharmacy Topics Reviewed, Medications and  their functions  Safety Interventions   Safety Discussed/Reviewed Safety Reviewed, Safety Discussed, Fall Risk, Home Safety  Home Safety Assistive Devices          SDOH assessments and interventions completed:  No     Care Coordination Interventions:  Yes, provided   Follow up plan: Referral made to SW [name] BSW to educate patient/family about assisted living  Follow up call scheduled for 10/14/22 @09 :00 AM    Encounter Outcome:  Pt. Visit Completed

## 2022-09-29 NOTE — Telephone Encounter (Signed)
Dr. Hyacinth Meeker called and stated that he received a call that patient was not receiving her Humalog Insulin and wanted to change the directions.   Humalog Inject 4 units before breakfast, 6 units before lunch and 8 units before supper.   Rx updated.   Also requested the insulin Pen Needles to be sent to pharmacy.   Updated and sent to pharmacy as requested.

## 2022-09-30 DIAGNOSIS — L89893 Pressure ulcer of other site, stage 3: Secondary | ICD-10-CM | POA: Diagnosis not present

## 2022-09-30 DIAGNOSIS — E1122 Type 2 diabetes mellitus with diabetic chronic kidney disease: Secondary | ICD-10-CM | POA: Diagnosis not present

## 2022-09-30 DIAGNOSIS — I130 Hypertensive heart and chronic kidney disease with heart failure and stage 1 through stage 4 chronic kidney disease, or unspecified chronic kidney disease: Secondary | ICD-10-CM | POA: Diagnosis not present

## 2022-09-30 DIAGNOSIS — I5033 Acute on chronic diastolic (congestive) heart failure: Secondary | ICD-10-CM | POA: Diagnosis not present

## 2022-09-30 DIAGNOSIS — L03116 Cellulitis of left lower limb: Secondary | ICD-10-CM | POA: Diagnosis not present

## 2022-09-30 DIAGNOSIS — N1831 Chronic kidney disease, stage 3a: Secondary | ICD-10-CM | POA: Diagnosis not present

## 2022-10-01 DIAGNOSIS — F411 Generalized anxiety disorder: Secondary | ICD-10-CM | POA: Diagnosis not present

## 2022-10-01 DIAGNOSIS — I130 Hypertensive heart and chronic kidney disease with heart failure and stage 1 through stage 4 chronic kidney disease, or unspecified chronic kidney disease: Secondary | ICD-10-CM | POA: Diagnosis not present

## 2022-10-01 DIAGNOSIS — I493 Ventricular premature depolarization: Secondary | ICD-10-CM | POA: Diagnosis not present

## 2022-10-01 DIAGNOSIS — Z8673 Personal history of transient ischemic attack (TIA), and cerebral infarction without residual deficits: Secondary | ICD-10-CM | POA: Diagnosis not present

## 2022-10-01 DIAGNOSIS — I5033 Acute on chronic diastolic (congestive) heart failure: Secondary | ICD-10-CM | POA: Diagnosis not present

## 2022-10-01 DIAGNOSIS — D509 Iron deficiency anemia, unspecified: Secondary | ICD-10-CM | POA: Diagnosis not present

## 2022-10-01 DIAGNOSIS — I25118 Atherosclerotic heart disease of native coronary artery with other forms of angina pectoris: Secondary | ICD-10-CM | POA: Diagnosis not present

## 2022-10-01 DIAGNOSIS — Z794 Long term (current) use of insulin: Secondary | ICD-10-CM | POA: Diagnosis not present

## 2022-10-01 DIAGNOSIS — M810 Age-related osteoporosis without current pathological fracture: Secondary | ICD-10-CM | POA: Diagnosis not present

## 2022-10-01 DIAGNOSIS — E1122 Type 2 diabetes mellitus with diabetic chronic kidney disease: Secondary | ICD-10-CM | POA: Diagnosis not present

## 2022-10-01 DIAGNOSIS — K7460 Unspecified cirrhosis of liver: Secondary | ICD-10-CM | POA: Diagnosis not present

## 2022-10-01 DIAGNOSIS — N1831 Chronic kidney disease, stage 3a: Secondary | ICD-10-CM | POA: Diagnosis not present

## 2022-10-01 DIAGNOSIS — K7581 Nonalcoholic steatohepatitis (NASH): Secondary | ICD-10-CM | POA: Diagnosis not present

## 2022-10-01 DIAGNOSIS — E1142 Type 2 diabetes mellitus with diabetic polyneuropathy: Secondary | ICD-10-CM | POA: Diagnosis not present

## 2022-10-01 DIAGNOSIS — E538 Deficiency of other specified B group vitamins: Secondary | ICD-10-CM | POA: Diagnosis not present

## 2022-10-01 DIAGNOSIS — L89893 Pressure ulcer of other site, stage 3: Secondary | ICD-10-CM | POA: Diagnosis not present

## 2022-10-01 DIAGNOSIS — E876 Hypokalemia: Secondary | ICD-10-CM | POA: Diagnosis not present

## 2022-10-01 DIAGNOSIS — E1165 Type 2 diabetes mellitus with hyperglycemia: Secondary | ICD-10-CM | POA: Diagnosis not present

## 2022-10-01 DIAGNOSIS — L03116 Cellulitis of left lower limb: Secondary | ICD-10-CM | POA: Diagnosis not present

## 2022-10-01 DIAGNOSIS — N3020 Other chronic cystitis without hematuria: Secondary | ICD-10-CM | POA: Diagnosis not present

## 2022-10-01 DIAGNOSIS — Z7984 Long term (current) use of oral hypoglycemic drugs: Secondary | ICD-10-CM | POA: Diagnosis not present

## 2022-10-01 DIAGNOSIS — M25552 Pain in left hip: Secondary | ICD-10-CM | POA: Diagnosis not present

## 2022-10-01 DIAGNOSIS — I8500 Esophageal varices without bleeding: Secondary | ICD-10-CM | POA: Diagnosis not present

## 2022-10-01 DIAGNOSIS — I480 Paroxysmal atrial fibrillation: Secondary | ICD-10-CM | POA: Diagnosis not present

## 2022-10-01 DIAGNOSIS — R42 Dizziness and giddiness: Secondary | ICD-10-CM | POA: Diagnosis not present

## 2022-10-01 LAB — CULTURE, BLOOD (ROUTINE X 2)
Culture: NO GROWTH
Culture: NO GROWTH
Special Requests: ADEQUATE

## 2022-10-06 ENCOUNTER — Telehealth: Payer: Self-pay | Admitting: *Deleted

## 2022-10-06 DIAGNOSIS — Z87442 Personal history of urinary calculi: Secondary | ICD-10-CM | POA: Diagnosis not present

## 2022-10-06 DIAGNOSIS — I5033 Acute on chronic diastolic (congestive) heart failure: Secondary | ICD-10-CM | POA: Diagnosis not present

## 2022-10-06 DIAGNOSIS — E1122 Type 2 diabetes mellitus with diabetic chronic kidney disease: Secondary | ICD-10-CM | POA: Diagnosis not present

## 2022-10-06 DIAGNOSIS — L03116 Cellulitis of left lower limb: Secondary | ICD-10-CM | POA: Diagnosis not present

## 2022-10-06 DIAGNOSIS — N3020 Other chronic cystitis without hematuria: Secondary | ICD-10-CM | POA: Diagnosis not present

## 2022-10-06 DIAGNOSIS — I130 Hypertensive heart and chronic kidney disease with heart failure and stage 1 through stage 4 chronic kidney disease, or unspecified chronic kidney disease: Secondary | ICD-10-CM | POA: Diagnosis not present

## 2022-10-06 DIAGNOSIS — N1831 Chronic kidney disease, stage 3a: Secondary | ICD-10-CM | POA: Diagnosis not present

## 2022-10-06 DIAGNOSIS — L89893 Pressure ulcer of other site, stage 3: Secondary | ICD-10-CM | POA: Diagnosis not present

## 2022-10-06 NOTE — Telephone Encounter (Signed)
Carney Bern, ST with [facility] home Health called requesting Verbal orders for ST Evaluation.   Orders given.

## 2022-10-07 ENCOUNTER — Encounter (HOSPITAL_BASED_OUTPATIENT_CLINIC_OR_DEPARTMENT_OTHER): Payer: Self-pay

## 2022-10-07 ENCOUNTER — Emergency Department (HOSPITAL_BASED_OUTPATIENT_CLINIC_OR_DEPARTMENT_OTHER)
Admission: EM | Admit: 2022-10-07 | Discharge: 2022-10-07 | Disposition: A | Discharge status: Home / Self Care | Payer: Medicare Other | Attending: Emergency Medicine | Admitting: Emergency Medicine

## 2022-10-07 ENCOUNTER — Other Ambulatory Visit: Payer: Self-pay

## 2022-10-07 ENCOUNTER — Emergency Department (HOSPITAL_BASED_OUTPATIENT_CLINIC_OR_DEPARTMENT_OTHER): Payer: Medicare Other

## 2022-10-07 ENCOUNTER — Ambulatory Visit: Payer: Self-pay

## 2022-10-07 DIAGNOSIS — L89893 Pressure ulcer of other site, stage 3: Secondary | ICD-10-CM | POA: Diagnosis not present

## 2022-10-07 DIAGNOSIS — Z794 Long term (current) use of insulin: Secondary | ICD-10-CM | POA: Insufficient documentation

## 2022-10-07 DIAGNOSIS — E860 Dehydration: Secondary | ICD-10-CM | POA: Diagnosis not present

## 2022-10-07 DIAGNOSIS — R4182 Altered mental status, unspecified: Secondary | ICD-10-CM | POA: Diagnosis not present

## 2022-10-07 DIAGNOSIS — I130 Hypertensive heart and chronic kidney disease with heart failure and stage 1 through stage 4 chronic kidney disease, or unspecified chronic kidney disease: Secondary | ICD-10-CM | POA: Diagnosis not present

## 2022-10-07 DIAGNOSIS — E1122 Type 2 diabetes mellitus with diabetic chronic kidney disease: Secondary | ICD-10-CM | POA: Diagnosis not present

## 2022-10-07 DIAGNOSIS — Z20822 Contact with and (suspected) exposure to covid-19: Secondary | ICD-10-CM | POA: Insufficient documentation

## 2022-10-07 DIAGNOSIS — R059 Cough, unspecified: Secondary | ICD-10-CM | POA: Diagnosis not present

## 2022-10-07 DIAGNOSIS — E1165 Type 2 diabetes mellitus with hyperglycemia: Secondary | ICD-10-CM | POA: Insufficient documentation

## 2022-10-07 DIAGNOSIS — E876 Hypokalemia: Secondary | ICD-10-CM | POA: Diagnosis not present

## 2022-10-07 DIAGNOSIS — Z1152 Encounter for screening for COVID-19: Secondary | ICD-10-CM | POA: Diagnosis not present

## 2022-10-07 DIAGNOSIS — I5033 Acute on chronic diastolic (congestive) heart failure: Secondary | ICD-10-CM | POA: Diagnosis not present

## 2022-10-07 DIAGNOSIS — Z91040 Latex allergy status: Secondary | ICD-10-CM | POA: Insufficient documentation

## 2022-10-07 DIAGNOSIS — N1831 Chronic kidney disease, stage 3a: Secondary | ICD-10-CM | POA: Diagnosis not present

## 2022-10-07 DIAGNOSIS — R739 Hyperglycemia, unspecified: Secondary | ICD-10-CM | POA: Diagnosis present

## 2022-10-07 DIAGNOSIS — L03116 Cellulitis of left lower limb: Secondary | ICD-10-CM | POA: Diagnosis not present

## 2022-10-07 LAB — I-STAT VENOUS BLOOD GAS, ED
Acid-Base Excess: 10 mmol/L — ABNORMAL HIGH (ref 0.0–2.0)
Bicarbonate: 34.4 mmol/L — ABNORMAL HIGH (ref 20.0–28.0)
Calcium, Ion: 1.37 mmol/L (ref 1.15–1.40)
HCT: 36 % (ref 36.0–46.0)
Hemoglobin: 12.2 g/dL (ref 12.0–15.0)
O2 Saturation: 74 %
Potassium: 2.9 mmol/L — ABNORMAL LOW (ref 3.5–5.1)
Sodium: 134 mmol/L — ABNORMAL LOW (ref 135–145)
TCO2: 36 mmol/L — ABNORMAL HIGH (ref 22–32)
pCO2, Ven: 45.4 mmHg (ref 44–60)
pH, Ven: 7.487 — ABNORMAL HIGH (ref 7.25–7.43)
pO2, Ven: 37 mmHg (ref 32–45)

## 2022-10-07 LAB — CBC
HCT: 36.6 % (ref 36.0–46.0)
Hemoglobin: 12.3 g/dL (ref 12.0–15.0)
MCH: 31 pg (ref 26.0–34.0)
MCHC: 33.6 g/dL (ref 30.0–36.0)
MCV: 92.2 fL (ref 80.0–100.0)
Platelets: 112 10*3/uL — ABNORMAL LOW (ref 150–400)
RBC: 3.97 MIL/uL (ref 3.87–5.11)
RDW: 15 % (ref 11.5–15.5)
WBC: 4.9 10*3/uL (ref 4.0–10.5)
nRBC: 0 % (ref 0.0–0.2)

## 2022-10-07 LAB — MAGNESIUM: Magnesium: 1.8 mg/dL (ref 1.7–2.4)

## 2022-10-07 LAB — URINALYSIS, ROUTINE W REFLEX MICROSCOPIC
Bilirubin Urine: NEGATIVE
Glucose, UA: >1000 mg/dL — AB
Hgb urine dipstick: NEGATIVE
Ketones, ur: NEGATIVE mg/dL
Leukocytes,Ua: NEGATIVE
Nitrite: NEGATIVE
Protein, ur: NEGATIVE mg/dL
Specific Gravity, Urine: 1.027 (ref 1.005–1.030)
pH: 7 (ref 5.0–8.0)

## 2022-10-07 LAB — BETA-HYDROXYBUTYRIC ACID: Beta-Hydroxybutyric Acid: 0.38 mmol/L — ABNORMAL HIGH (ref 0.05–0.27)

## 2022-10-07 LAB — BASIC METABOLIC PANEL
Anion gap: 9 (ref 5–15)
BUN: 21 mg/dL (ref 8–23)
CO2: 31 mmol/L (ref 22–32)
Calcium: 10.4 mg/dL — ABNORMAL HIGH (ref 8.9–10.3)
Chloride: 94 mmol/L — ABNORMAL LOW (ref 98–111)
Creatinine, Ser: 0.71 mg/dL (ref 0.44–1.00)
GFR, Estimated: >60 mL/min (ref >60)
Glucose, Bld: 447 mg/dL — ABNORMAL HIGH (ref 70–99)
Potassium: 2.9 mmol/L — ABNORMAL LOW (ref 3.5–5.1)
Sodium: 134 mmol/L — ABNORMAL LOW (ref 135–145)

## 2022-10-07 LAB — RESP PANEL BY RT-PCR (RSV, FLU A&B, COVID)  RVPGX2
Influenza A by PCR: NEGATIVE
Influenza B by PCR: NEGATIVE
Resp Syncytial Virus by PCR: NEGATIVE
SARS Coronavirus 2 by RT PCR: NEGATIVE

## 2022-10-07 LAB — CBG MONITORING, ED
Glucose-Capillary: 421 mg/dL — ABNORMAL HIGH (ref 70–99)
Glucose-Capillary: 298 mg/dL — ABNORMAL HIGH (ref 70–99)

## 2022-10-07 LAB — AMMONIA: Ammonia: 12 umol/L (ref 9–35)

## 2022-10-07 MED ORDER — LACTATED RINGERS IV BOLUS
1000.0000 mL | Freq: Once | INTRAVENOUS | Status: AC
  Administered 2022-10-07: 1000 mL via INTRAVENOUS

## 2022-10-07 MED ORDER — POTASSIUM CHLORIDE CRYS ER 20 MEQ PO TBCR: 20.0000 meq | EXTENDED_RELEASE_TABLET | Freq: Every day | ORAL | 0 refills | Status: DC

## 2022-10-07 MED ORDER — POTASSIUM CHLORIDE 20 MEQ PO PACK
80.0000 meq | PACK | ORAL | Status: AC
  Administered 2022-10-07: 80 meq via ORAL
  Filled 2022-10-07: qty 4

## 2022-10-07 NOTE — ED Notes (Signed)
VBG completed and handed to Physician 

## 2022-10-07 NOTE — ED Notes (Signed)
To ct

## 2022-10-07 NOTE — ED Notes (Signed)
CBG 298 

## 2022-10-07 NOTE — Patient Outreach (Signed)
  Care Coordination   Follow Up Visit Note   10/07/2022 Name: Carlin Madero MRN: [id number] DOB: 01-17-1946  Levora Dredge Meroney is a 77 y.o. year old female who sees Frederica Kuster, MD for primary care. I  spoke with patients son [name] by phone to assist with care coordination needs.  What matters to the patients health and wellness today?  The patients family is interested in learning about options for placement due to increased falls at home    Goals Addressed             This Visit's Progress    COMPLETED: Care Coordination Activities       Care Coordination Interventions: Contacted the patients son [name] to assist with education on placement into assisted living Determined the patient would need to apply for Special Assistance Medicaid in order to cover the cost of placement Education provided on what applying for Medicaid means as it impacts patients income and asset limit Encouraged [name] to speak with a lawyer in regards to asset protection and questions surrounding how assets of joint accounts are impacted Discussed the patient does not currently want placement; [name] is the named healthcare power of attorney on patients Advance Directive document Education provided on when a health care power of attorney is able to make choices on the patients behalf advising a health care power of attorney is unable to place someone against their will if they are still competent to make their own decisions  Determined the patient has family staying with her around the clock and assisting with meal support to ensure safety in the home Assessed for interest in patient being referred to [facility] [employer] on [address] - family declines referral at this time Discussed the family will call or visit DSS to request assistance from a placement specialist if/when the patient is agreeable to placement Collaboration with RN Care Manager to advise of interventions and plan for  the patient to remain at home while the family consults a lawyer regarding asset protection. The family is also still discussing if placement is appropriate for the patient at this time         SDOH assessments and interventions completed:  No     Care Coordination Interventions:  Yes, provided   Interventions Today    Flowsheet Row Most Recent Value  Chronic Disease   Chronic disease during today's visit Chronic Kidney Disease/End Stage Renal Disease (ESRD)  General Interventions   General Interventions Discussed/Reviewed General Interventions Discussed, Level of Care, Communication with  Communication with RN  Level of Care Assisted Living  [provided education on the process for placement]  Education Interventions   Education Provided Provided Education        Follow up plan: No further intervention required. The patient will remain engaged with RN Care Manager Lawanna Kobus Little to address ongoing care management needs.    Encounter Outcome:  Pt. Visit Completed   [name], BSW, CDP Social Worker, Certified Dementia Practitioner [facility] Care Management  Care Coordination [phone]

## 2022-10-07 NOTE — Discharge Instructions (Signed)
Thank you for letting us take care of you today.  Overall, your workup was very reassuring. We were able to get your blood sugar under better control. Continue to take your medications at home as prescribed by your outpatient team.   Your potassium was low and we gave you medication to replace this. I am sending you home on 5 days of potassium to help replete this. Have this number rechecked by your primary doctor and discuss if you should continue taking potassium.  I want you seen by your PCP early next week for re-evaluation. Discuss any continued concerns including changes in mental status and other symptoms. It may be beneficial to follow up with a neurologist or other specialist.  For any new or worsening condition including fever, chest pain, shortness of breath, dizziness, lightheadedness, vomiting, loss of consciousness, or other new symptoms, return to ED immediately for re-evaluation.

## 2022-10-07 NOTE — Patient Instructions (Signed)
Visit Information  Thank you for taking time to visit with me today. Please don't hesitate to contact me if I can be of assistance to you.   Following are the goals we discussed today:  - Contact DSS for assistance with placement if/when needed   If you are experiencing a Mental Health or Behavioral Health Crisis or need someone to talk to, please go to [facility] [address] [place] [phone]) call [phone]  Patient verbalizes understanding of instructions and care plan provided today and agrees to view in MyChart. Active MyChart status and patient understanding of how to access instructions and care plan via MyChart confirmed with patient.     No further follow up required: Please contact me as needed  [name], BSW, CDP Social Worker, Certified Dementia Practitioner [facility] Care Management  Care Coordination [phone]

## 2022-10-07 NOTE — ED Triage Notes (Signed)
Patient here POV from Home.  Endorses Hyperglycemia over the past week. CBG was above 600 2  hours PTA.   No N/V. No Pain.   NAD Noted during Triage. A&Ox4. Gcs 15. BIB Personal Wheelchair.

## 2022-10-07 NOTE — ED Provider Notes (Signed)
[place] EMERGENCY DEPARTMENT AT [facility] Provider Note   CSN: [id number] Arrival date & time: 10/07/22  1628     History {Add pertinent medical, surgical, social history, OB history to HPI:1} Chief Complaint  Patient presents with   Hyperglycemia    [name] is a 77 y.o. female.  HPI     Home Medications Prior to Admission medications   Medication Sig Start Date End Date Taking? Authorizing Provider  potassium chloride SA (KLOR-CON M) 20 MEQ tablet Take 1 tablet (20 mEq total) by mouth daily for 5 days. 10/07/22 10/12/22 Yes Levie Owensby L, PA-C  acetaminophen (TYLENOL) 500 MG tablet Take 1,000 mg by mouth every 6 (six) hours as needed for moderate pain.    [provider]  artificial tears (LACRILUBE) OINT ophthalmic ointment Place into both eyes at bedtime. 09/09/22   Barnetta Chapel, MD  Calcium Carb-Cholecalciferol (CALCIUM 600 + D PO) Take 1 tablet by mouth in the morning and at bedtime.    [provider]  Cholecalciferol (VITAMIN D-3) 25 MCG (1000 UT) CAPS Take 1,000 Units by mouth daily.    [provider]  cyanocobalamin (VITAMIN B12) 1000 MCG tablet Take 1,000 mcg by mouth daily.    [provider]  ezetimibe (ZETIA) 10 MG tablet Take 1 tablet (10 mg total) by mouth daily. 02/09/22   Frederica Kuster, MD  fish oil-omega-3 fatty acids 1000 MG capsule Take 1 g by mouth daily.    [provider]  furosemide (LASIX) 20 MG tablet Take 40 mg by mouth daily.    [provider]  glucose blood test strip One Touch Ultra Blue test strips. Use to test blood sugar three times daily. Dx: E11.65 02/09/22   Frederica Kuster, MD  Insulin Glargine [facility]) 100 UNIT/ML Inject 30 Units into the skin daily. 09/09/22   Berton Mount I, MD  insulin lispro (HUMALOG) 100 UNIT/ML injection Inject 4 units before breakfast. Inject 6 units before lunch. Inject 8 units before supper. Dx: E11.59 09/29/22    Frederica Kuster, MD  Insulin Pen Needle (B-D ULTRAFINE III SHORT PEN) 31G X 8 MM MISC Use to give Insulin injections daily. Dx: E11.59 09/29/22   Frederica Kuster, MD  Insulin Syringe-Needle U-100 (INSULIN SYRINGE 1CC/31GX5/16") 31G X 5/16" 1 ML MISC USE AS DIRECTED TO INJECT LEVIMIR 07/29/20   Frederica Kuster, MD  lactulose (CHRONULAC) 10 GM/15ML solution TAKE 30 ML BY MOUTH EVERY DAY Patient taking differently: Take 20 g by mouth daily. 08/23/22   Lynann Bologna, MD  Lancets [facility] LANCET) MISC 1 Device by Does not apply route in the morning and at bedtime. 09/14/22   Frederica Kuster, MD  MAGNESIUM PO Take 1 tablet by mouth at bedtime.    [provider]  methocarbamol (ROBAXIN) 500 MG tablet Take 1 tablet (500 mg total) by mouth every 8 (eight) hours as needed (muscle spasm/pain). 09/22/22   Cathren Laine, MD  nystatin cream (MYCOSTATIN) Apply 1 Application topically 2 (two) times daily.    [provider]  pantoprazole (PROTONIX) 40 MG tablet TAKE 1 TABLET(40 MG) BY MOUTH DAILY Patient taking differently: Take 40 mg by mouth daily. 10/25/21   Frederica Kuster, MD  rifaximin (XIFAXAN) 550 MG TABS tablet Take 550 mg by mouth 2 (two) times daily.    [provider]  senna-docusate (SENOKOT-S) 8.6-50 MG tablet Take 1 tablet by mouth at bedtime as needed for moderate constipation. 09/09/22 10/09/22  Barnetta Chapel, MD      Allergies    Kiwi extract, Gabapentin, Tdap [tetanus-diphth-acell pertussis], Latex, Statins, and Tramadol    Review of Systems   Review of Systems  Physical Exam Updated Vital Signs BP 136/77   Pulse 79   Temp 98.6 F (37 C) (Oral)   Resp 16   Ht 5\' 3"  (1.6 m)   Wt 58.4 kg   LMP  (LMP Unknown)   SpO2 98%   BMI 22.81 kg/m  Physical Exam  ED Results / Procedures / Treatments   Labs (all labs ordered are listed, but only abnormal results are displayed) Labs Reviewed  CBC - Abnormal; Notable for the following components:       Result Value   Platelets 112 (*)    All other components within normal limits  BETA-HYDROXYBUTYRIC ACID - Abnormal; Notable for the following components:   Beta-Hydroxybutyric Acid 0.38 (*)    All other components within normal limits  BASIC METABOLIC PANEL - Abnormal; Notable for the following components:   Sodium 134 (*)    Potassium 2.9 (*)    Chloride 94 (*)    Glucose, Bld 447 (*)    Calcium 10.4 (*)    All other components within normal limits  URINALYSIS, ROUTINE W REFLEX MICROSCOPIC - Abnormal; Notable for the following components:   Glucose, UA >1,000 (*)    Bacteria, UA MANY (*)    All other components within normal limits  CBG MONITORING, ED - Abnormal; Notable for the following components:   Glucose-Capillary 421 (*)    All other components within normal limits  CBG MONITORING, ED - Abnormal; Notable for the following components:   Glucose-Capillary 298 (*)    All other components within normal limits  I-STAT VENOUS BLOOD GAS, ED - Abnormal; Notable for the following components:   pH, Ven 7.487 (*)    Bicarbonate 34.4 (*)    TCO2 36 (*)    Acid-Base Excess 10.0 (*)    Sodium 134 (*)    Potassium 2.9 (*)    All other components within normal limits  RESP PANEL BY RT-PCR (RSV, FLU A&B, COVID)  RVPGX2  MAGNESIUM  AMMONIA    EKG None  Radiology CT Head Wo Contrast  Result Date: 10/07/2022 CLINICAL DATA:  ams EXAM: CT HEAD WITHOUT CONTRAST TECHNIQUE: Contiguous axial images were obtained from the base of the skull through the vertex without intravenous contrast. RADIATION DOSE REDUCTION: This exam was performed according to the departmental dose-optimization program which includes automated exposure control, adjustment of the mA and/or kV according to patient size and/or use of iterative reconstruction technique. COMPARISON:  Head CT 11 days ago 09/26/2022, multiple additional priors reviewed. FINDINGS: Brain: No acute or recurrent subdural collection. No  evidence of acute infarct, hemorrhage, hydrocephalus, or mass lesion/mass effect. Stable chronic ischemic changes. Small area of chronic ischemia in the high left parietal lobe. Vascular: No hyperdense vessel or unexpected calcification. Skull: No fracture or focal lesion. Sinuses/Orbits: No acute finding. Other: None. IMPRESSION: 1. No acute intracranial abnormality. 2. Stable chronic ischemic changes. Electronically Signed   By: Narda Rutherford M.D.   On: 10/07/2022 21:08   DG Chest Port 1 View  Result Date: 10/07/2022 CLINICAL DATA:  Cough EXAM: PORTABLE CHEST 1 VIEW COMPARISON:  Chest x-ray 09/26/2022 and older FINDINGS: Status post median sternotomy. Normal cardiopericardial silhouette with a calcified aorta. No consolidation, pneumothorax or effusion. No edema. Chronic interstitial lung changes are identified. Artifact from the patient's clothing.  In the upper abdomen note is made of presumed tips shunt and embolization material. IMPRESSION: Postop chest with chronic changes. No acute cardiopulmonary disease. Electronically Signed   By: Karen Kays M.D.   On: 10/07/2022 18:50    Procedures Procedures  {Document cardiac monitor, telemetry assessment procedure when appropriate:1}  Medications Ordered in ED Medications  lactated ringers bolus 1,000 mL (0 mLs Intravenous Stopped 10/07/22 2101)  potassium chloride (KLOR-CON) packet 80 mEq (80 mEq Oral Given 10/07/22 2058)    ED Course/ Medical Decision Making/ A&P   {   Click here for ABCD2, HEART and other calculatorsREFRESH Note before signing :1}                          Medical Decision Making Amount and/or Complexity of Data Reviewed Labs: ordered. Decision-making details documented in ED Course. Radiology: ordered. Decision-making details documented in ED Course. ECG/medicine tests: ordered. Decision-making details documented in ED Course.  Risk Prescription drug management.   Medical Decision Making:   Nelissa Oehlert  is a 77 y.o. female who presented to the ED today with *** detailed above.    {crccomplexity:27900} Complete initial physical exam performed, notably the patient was ***.    Reviewed and confirmed nursing documentation for past medical history, family history, social history.    Initial Assessment:   With the patient's presentation of ***, most likely diagnosis is ***. Other diagnoses were considered including (but not limited to) ***. These are considered less likely due to history of present illness and physical exam findings.   {crccopa:27899}  Initial Plan:  ***Screening labs including CBC and Metabolic panel to evaluate for infectious or metabolic etiology of disease.  ***Urinalysis with reflex culture ordered to evaluate for UTI or relevant urologic/nephrologic pathology.  ***CXR to evaluate for structural/infectious intrathoracic pathology.  ***EKG to evaluate for cardiac pathology Objective evaluation as below reviewed   Initial Study Results:   Laboratory  All laboratory results reviewed without evidence of clinically relevant pathology.   ***Exceptions include: ***   ***EKG EKG was reviewed independently. Rate, rhythm, axis, intervals all examined and without medically relevant abnormality. ST segments without concerns for elevations.    Radiology:  All images reviewed independently. ***Agree with radiology report at this time.   CT Head Wo Contrast  Result Date: 10/07/2022 CLINICAL DATA:  ams EXAM: CT HEAD WITHOUT CONTRAST TECHNIQUE: Contiguous axial images were obtained from the base of the skull through the vertex without intravenous contrast. RADIATION DOSE REDUCTION: This exam was performed according to the departmental dose-optimization program which includes automated exposure control, adjustment of the mA and/or kV according to patient size and/or use of iterative reconstruction technique. COMPARISON:  Head CT 11 days ago 09/26/2022, multiple additional priors reviewed.  FINDINGS: Brain: No acute or recurrent subdural collection. No evidence of acute infarct, hemorrhage, hydrocephalus, or mass lesion/mass effect. Stable chronic ischemic changes. Small area of chronic ischemia in the high left parietal lobe. Vascular: No hyperdense vessel or unexpected calcification. Skull: No fracture or focal lesion. Sinuses/Orbits: No acute finding. Other: None. IMPRESSION: 1. No acute intracranial abnormality. 2. Stable chronic ischemic changes. Electronically Signed   By: Narda Rutherford M.D.   On: 10/07/2022 21:08   DG Chest Port 1 View  Result Date: 10/07/2022 CLINICAL DATA:  Cough EXAM: PORTABLE CHEST 1 VIEW COMPARISON:  Chest x-ray 09/26/2022 and older FINDINGS: Status post median sternotomy. Normal cardiopericardial silhouette with a calcified aorta. No consolidation, pneumothorax or effusion. No  edema. Chronic interstitial lung changes are identified. Artifact from the patient's clothing. In the upper abdomen note is made of presumed tips shunt and embolization material. IMPRESSION: Postop chest with chronic changes. No acute cardiopulmonary disease. Electronically Signed   By: Karen Kays M.D.   On: 10/07/2022 18:50   CT Head Wo Contrast  Result Date: 09/26/2022 CLINICAL DATA:  Follow-up subdural hematoma, weakness EXAM: CT HEAD WITHOUT CONTRAST TECHNIQUE: Contiguous axial images were obtained from the base of the skull through the vertex without intravenous contrast. RADIATION DOSE REDUCTION: This exam was performed according to the departmental dose-optimization program which includes automated exposure control, adjustment of the mA and/or kV according to patient size and/or use of iterative reconstruction technique. COMPARISON:  09/02/2022 FINDINGS: Brain: No evidence of acute infarction, hemorrhage, hydrocephalus, extra-axial collection or mass lesion/mass effect. Prior minimal subdural hematoma along the falx and left tentorium has resolved. Subcortical white matter and  periventricular small vessel ischemic changes. Vascular: Intracranial atherosclerosis. Skull: Normal. Negative for fracture or focal lesion. Sinuses/Orbits: The visualized paranasal sinuses are essentially clear. The mastoid air cells are unopacified. Other: None. IMPRESSION: No acute intracranial abnormality. Prior minimal subdural hematoma along the falx and left tentorium has resolved. Small vessel ischemic changes. Electronically Signed   By: Charline Bills M.D.   On: 09/26/2022 09:22   DG Chest Portable 1 View  Result Date: 09/26/2022 CLINICAL DATA:  Fatigue. EXAM: PORTABLE CHEST 1 VIEW COMPARISON:  09/22/2022 FINDINGS: Heart size appears within normal limits. Status post median sternotomy and CABG procedure. No pleural effusion or edema. Pulmonary vascular congestion. Unchanged appearance right posterior seventh and eighth rib fractures. IMPRESSION: 1. Pulmonary vascular congestion. 2. Unchanged right posterior seventh and eighth rib fractures. Electronically Signed   By: Signa Kell M.D.   On: 09/26/2022 08:24   DG Ribs Unilateral W/Chest Right  Result Date: 09/22/2022 CLINICAL DATA:  fall /pain EXAM: RIGHT RIBS AND CHEST - 3+ VIEW COMPARISON:  Chest x-ray 09/03/2022, CT chest 08/21/2022 FINDINGS: The heart and mediastinal contours are unchanged. Aortic calcification. No focal consolidation. Chronic coarsened interstitial markings. No pleural effusion. No pneumothorax. Likely chronic nondisplaced lateral of eighth rib fracture. Likely chronic nondisplaced lateral seventh rib fracture. Age-indeterminate, likely acute, displaced posterior seventh and eighth rib fractures. Sternotomy wires are intact. Tips noted.  Vascular coiling noted in the left upper quadrant. IMPRESSION: 1. No acute cardiopulmonary abnormality. 2. Age-indeterminate, likely acute, displaced posterior seventh and eighth rib fractures. Possibly chronic nondisplaced lateral seventh and eighth rib fractures. Correlate with point  tenderness to palpation to evaluate for an acute component. Electronically Signed   By: Tish Frederickson M.D.   On: 09/22/2022 19:15   DG Humerus Right  Result Date: 09/22/2022 CLINICAL DATA:  fall /pain EXAM: RIGHT HUMERUS - 2+ VIEW COMPARISON:  None Available. FINDINGS: There is no evidence of fracture or other focal bone lesions. Elbow grossly unremarkable. Please see separately dictated x-ray right shoulder 09/22/2022. Mild subcutaneus soft tissue edema of the proximal arm. IMPRESSION: No acute displaced fracture or dislocation. Electronically Signed   By: Tish Frederickson M.D.   On: 09/22/2022 19:09   DG Shoulder Right  Result Date: 09/22/2022 CLINICAL DATA:  fall /pain EXAM: RIGHT SHOULDER - 2+ VIEW COMPARISON:  None Available. FINDINGS: There is no evidence of fracture or dislocation. At least moderate degenerative changes of the right glenohumeral and acromioclavicular joints. Soft tissues are unremarkable. Question cortical irregularity of the right mid CP x-ray chest and right ribs 09/22/2022. IMPRESSION: No acute displaced fracture  or dislocation. Electronically Signed   By: Tish Frederickson M.D.   On: 09/22/2022 19:06      Consults: Case discussed with ***.   Final Assessment and Plan:   Discussed with attending physician who co-signed this note who also evaluated pt who was neurologically intact, reviewed findings, and agreed no indication for admission at this time but pt should closely follow up with PCP.     Clinical Impression:  1. Hyperglycemia due to diabetes mellitus (HCC)   2. Hypokalemia   3. Altered mental status, unspecified altered mental status type      Discharge    {Document critical care time when appropriate:1} {Document review of labs and clinical decision tools ie heart score, Chads2Vasc2 etc:1}  {Document your independent review of radiology images, and any outside records:1} {Document your discussion with family members, caretakers, and with  consultants:1} {Document social determinants of health affecting pt's care:1} {Document your decision making why or why not admission, treatments were needed:1} Final Clinical Impression(s) / ED Diagnoses Final diagnoses:  Hyperglycemia due to diabetes mellitus (HCC)  Hypokalemia  Altered mental status, unspecified altered mental status type    Rx / DC Orders ED Discharge Orders          Ordered    potassium chloride SA (KLOR-CON M) 20 MEQ tablet  Daily        10/07/22 2255

## 2022-10-10 ENCOUNTER — Telehealth: Payer: Self-pay

## 2022-10-10 ENCOUNTER — Encounter (HOSPITAL_BASED_OUTPATIENT_CLINIC_OR_DEPARTMENT_OTHER): Payer: Medicare Other | Attending: Internal Medicine | Admitting: Internal Medicine

## 2022-10-10 ENCOUNTER — Other Ambulatory Visit: Payer: Self-pay | Admitting: Nurse Practitioner

## 2022-10-10 DIAGNOSIS — I130 Hypertensive heart and chronic kidney disease with heart failure and stage 1 through stage 4 chronic kidney disease, or unspecified chronic kidney disease: Secondary | ICD-10-CM | POA: Diagnosis not present

## 2022-10-10 DIAGNOSIS — E1122 Type 2 diabetes mellitus with diabetic chronic kidney disease: Secondary | ICD-10-CM | POA: Diagnosis not present

## 2022-10-10 DIAGNOSIS — E1159 Type 2 diabetes mellitus with other circulatory complications: Secondary | ICD-10-CM

## 2022-10-10 DIAGNOSIS — N1831 Chronic kidney disease, stage 3a: Secondary | ICD-10-CM | POA: Diagnosis not present

## 2022-10-10 DIAGNOSIS — E119 Type 2 diabetes mellitus without complications: Secondary | ICD-10-CM | POA: Diagnosis not present

## 2022-10-10 DIAGNOSIS — L97825 Non-pressure chronic ulcer of other part of left lower leg with muscle involvement without evidence of necrosis: Secondary | ICD-10-CM | POA: Diagnosis not present

## 2022-10-10 DIAGNOSIS — E43 Unspecified severe protein-calorie malnutrition: Secondary | ICD-10-CM | POA: Diagnosis not present

## 2022-10-10 DIAGNOSIS — K7460 Unspecified cirrhosis of liver: Secondary | ICD-10-CM | POA: Diagnosis not present

## 2022-10-10 DIAGNOSIS — I5033 Acute on chronic diastolic (congestive) heart failure: Secondary | ICD-10-CM | POA: Diagnosis not present

## 2022-10-10 DIAGNOSIS — E11622 Type 2 diabetes mellitus with other skin ulcer: Secondary | ICD-10-CM | POA: Diagnosis not present

## 2022-10-10 DIAGNOSIS — I890 Lymphedema, not elsewhere classified: Secondary | ICD-10-CM | POA: Insufficient documentation

## 2022-10-10 DIAGNOSIS — L03116 Cellulitis of left lower limb: Secondary | ICD-10-CM | POA: Diagnosis not present

## 2022-10-10 DIAGNOSIS — L89893 Pressure ulcer of other site, stage 3: Secondary | ICD-10-CM | POA: Diagnosis not present

## 2022-10-10 MED ORDER — INSULIN ASPART 100 UNIT/ML IJ SOLN
INTRAMUSCULAR | 99 refills | Status: DC
Start: 2022-10-10 — End: 2023-03-29

## 2022-10-10 NOTE — Telephone Encounter (Signed)
[name] with [facility] called and left messasge on clinical intake voicemail during lunch stating patient's blood sugaar is 569. Please advise.  Message routed to Abbey Chatters, NP

## 2022-10-10 NOTE — Telephone Encounter (Signed)
[name] with [facility] speech therapy called requesting verbal orders for speech therapy 1 time a week for 4 weeks.  Verbal order was given.  Message sent to Dr. Jacalyn Lefevre [facility])

## 2022-10-10 NOTE — Telephone Encounter (Signed)
Has she been taking basaglar and humalog as prescribed? If so would give her additional 5 units of humalog and recheck in an hour.  If blood sugars are tending high please make appt to review

## 2022-10-10 NOTE — Telephone Encounter (Signed)
Refill request received from pharmacy for Jardiance 25 mg tablet. Medication is not on active medication list. Should patient be taking medication?  Message sent to Dr. Jacalyn Lefevre

## 2022-10-10 NOTE — Telephone Encounter (Signed)
Rx for novolog sent to pharmacy, it looks like this is covered, directs same as humalog inject 4 units before breakfast. Inject 6 units before lunch. Inject 8 units before supper.

## 2022-10-11 ENCOUNTER — Telehealth: Payer: Self-pay

## 2022-10-11 DIAGNOSIS — I130 Hypertensive heart and chronic kidney disease with heart failure and stage 1 through stage 4 chronic kidney disease, or unspecified chronic kidney disease: Secondary | ICD-10-CM | POA: Diagnosis not present

## 2022-10-11 DIAGNOSIS — N1831 Chronic kidney disease, stage 3a: Secondary | ICD-10-CM | POA: Diagnosis not present

## 2022-10-11 DIAGNOSIS — L03116 Cellulitis of left lower limb: Secondary | ICD-10-CM | POA: Diagnosis not present

## 2022-10-11 DIAGNOSIS — L89893 Pressure ulcer of other site, stage 3: Secondary | ICD-10-CM | POA: Diagnosis not present

## 2022-10-11 DIAGNOSIS — I5033 Acute on chronic diastolic (congestive) heart failure: Secondary | ICD-10-CM | POA: Diagnosis not present

## 2022-10-11 DIAGNOSIS — E1122 Type 2 diabetes mellitus with diabetic chronic kidney disease: Secondary | ICD-10-CM | POA: Diagnosis not present

## 2022-10-11 NOTE — Telephone Encounter (Signed)
Vikki Ports, nurse with [facility] called requesting parameters for blood sugar.  Patient's blood sugar was 569 on yesterday (see telephone note 10/10/22). Please advise.  Message sent to Dr. Jacalyn Lefevre

## 2022-10-11 NOTE — Telephone Encounter (Signed)
I need to see sugars on several days like a week to effectively adjust meds

## 2022-10-12 ENCOUNTER — Telehealth: Payer: Self-pay

## 2022-10-12 ENCOUNTER — Other Ambulatory Visit: Payer: Self-pay

## 2022-10-12 ENCOUNTER — Encounter: Payer: Self-pay | Admitting: Family Medicine

## 2022-10-12 ENCOUNTER — Inpatient Hospital Stay (HOSPITAL_BASED_OUTPATIENT_CLINIC_OR_DEPARTMENT_OTHER)
Admission: EM | Admit: 2022-10-12 | Discharge: 2022-10-18 | DRG: 690 | Disposition: A | Discharge status: Skilled Nursing Facility | Payer: Medicare Other | Attending: Family Medicine | Admitting: Family Medicine

## 2022-10-12 ENCOUNTER — Encounter (HOSPITAL_BASED_OUTPATIENT_CLINIC_OR_DEPARTMENT_OTHER): Payer: Self-pay | Admitting: Emergency Medicine

## 2022-10-12 DIAGNOSIS — B952 Enterococcus as the cause of diseases classified elsewhere: Secondary | ICD-10-CM | POA: Diagnosis present

## 2022-10-12 DIAGNOSIS — L89152 Pressure ulcer of sacral region, stage 2: Secondary | ICD-10-CM | POA: Diagnosis not present

## 2022-10-12 DIAGNOSIS — N181 Chronic kidney disease, stage 1: Secondary | ICD-10-CM | POA: Diagnosis present

## 2022-10-12 DIAGNOSIS — Z951 Presence of aortocoronary bypass graft: Secondary | ICD-10-CM

## 2022-10-12 DIAGNOSIS — Z9102 Food additives allergy status: Secondary | ICD-10-CM

## 2022-10-12 DIAGNOSIS — R739 Hyperglycemia, unspecified: Secondary | ICD-10-CM | POA: Diagnosis not present

## 2022-10-12 DIAGNOSIS — M545 Low back pain, unspecified: Secondary | ICD-10-CM | POA: Diagnosis not present

## 2022-10-12 DIAGNOSIS — E876 Hypokalemia: Secondary | ICD-10-CM | POA: Diagnosis present

## 2022-10-12 DIAGNOSIS — K7460 Unspecified cirrhosis of liver: Secondary | ICD-10-CM | POA: Diagnosis present

## 2022-10-12 DIAGNOSIS — M62838 Other muscle spasm: Secondary | ICD-10-CM | POA: Diagnosis not present

## 2022-10-12 DIAGNOSIS — R062 Wheezing: Secondary | ICD-10-CM | POA: Diagnosis not present

## 2022-10-12 DIAGNOSIS — I10 Essential (primary) hypertension: Secondary | ICD-10-CM | POA: Diagnosis not present

## 2022-10-12 DIAGNOSIS — N3000 Acute cystitis without hematuria: Secondary | ICD-10-CM | POA: Diagnosis not present

## 2022-10-12 DIAGNOSIS — E1142 Type 2 diabetes mellitus with diabetic polyneuropathy: Secondary | ICD-10-CM | POA: Diagnosis present

## 2022-10-12 DIAGNOSIS — K7581 Nonalcoholic steatohepatitis (NASH): Secondary | ICD-10-CM | POA: Diagnosis present

## 2022-10-12 DIAGNOSIS — I480 Paroxysmal atrial fibrillation: Secondary | ICD-10-CM | POA: Diagnosis present

## 2022-10-12 DIAGNOSIS — E785 Hyperlipidemia, unspecified: Secondary | ICD-10-CM | POA: Diagnosis present

## 2022-10-12 DIAGNOSIS — E1122 Type 2 diabetes mellitus with diabetic chronic kidney disease: Secondary | ICD-10-CM | POA: Diagnosis not present

## 2022-10-12 DIAGNOSIS — B957 Other staphylococcus as the cause of diseases classified elsewhere: Secondary | ICD-10-CM | POA: Diagnosis present

## 2022-10-12 DIAGNOSIS — Z8616 Personal history of COVID-19: Secondary | ICD-10-CM

## 2022-10-12 DIAGNOSIS — I130 Hypertensive heart and chronic kidney disease with heart failure and stage 1 through stage 4 chronic kidney disease, or unspecified chronic kidney disease: Secondary | ICD-10-CM | POA: Diagnosis not present

## 2022-10-12 DIAGNOSIS — N390 Urinary tract infection, site not specified: Secondary | ICD-10-CM | POA: Diagnosis not present

## 2022-10-12 DIAGNOSIS — Z981 Arthrodesis status: Secondary | ICD-10-CM

## 2022-10-12 DIAGNOSIS — R52 Pain, unspecified: Secondary | ICD-10-CM | POA: Diagnosis not present

## 2022-10-12 DIAGNOSIS — K219 Gastro-esophageal reflux disease without esophagitis: Secondary | ICD-10-CM | POA: Diagnosis present

## 2022-10-12 DIAGNOSIS — Z7401 Bed confinement status: Secondary | ICD-10-CM | POA: Diagnosis not present

## 2022-10-12 DIAGNOSIS — I5032 Chronic diastolic (congestive) heart failure: Secondary | ICD-10-CM | POA: Diagnosis not present

## 2022-10-12 DIAGNOSIS — A499 Bacterial infection, unspecified: Secondary | ICD-10-CM | POA: Diagnosis not present

## 2022-10-12 DIAGNOSIS — D649 Anemia, unspecified: Secondary | ICD-10-CM | POA: Diagnosis not present

## 2022-10-12 DIAGNOSIS — Z8261 Family history of arthritis: Secondary | ICD-10-CM

## 2022-10-12 DIAGNOSIS — E1165 Type 2 diabetes mellitus with hyperglycemia: Secondary | ICD-10-CM | POA: Diagnosis not present

## 2022-10-12 DIAGNOSIS — Z91040 Latex allergy status: Secondary | ICD-10-CM

## 2022-10-12 DIAGNOSIS — G8929 Other chronic pain: Secondary | ICD-10-CM | POA: Diagnosis not present

## 2022-10-12 DIAGNOSIS — G9341 Metabolic encephalopathy: Secondary | ICD-10-CM | POA: Diagnosis not present

## 2022-10-12 DIAGNOSIS — Z800 Family history of malignant neoplasm of digestive organs: Secondary | ICD-10-CM

## 2022-10-12 DIAGNOSIS — M1990 Unspecified osteoarthritis, unspecified site: Secondary | ICD-10-CM | POA: Diagnosis not present

## 2022-10-12 DIAGNOSIS — Z888 Allergy status to other drugs, medicaments and biological substances status: Secondary | ICD-10-CM | POA: Diagnosis not present

## 2022-10-12 DIAGNOSIS — Z8249 Family history of ischemic heart disease and other diseases of the circulatory system: Secondary | ICD-10-CM

## 2022-10-12 DIAGNOSIS — M542 Cervicalgia: Secondary | ICD-10-CM | POA: Diagnosis not present

## 2022-10-12 DIAGNOSIS — I252 Old myocardial infarction: Secondary | ICD-10-CM

## 2022-10-12 DIAGNOSIS — Z807 Family history of other malignant neoplasms of lymphoid, hematopoietic and related tissues: Secondary | ICD-10-CM

## 2022-10-12 DIAGNOSIS — Z794 Long term (current) use of insulin: Secondary | ICD-10-CM

## 2022-10-12 DIAGNOSIS — F419 Anxiety disorder, unspecified: Secondary | ICD-10-CM | POA: Diagnosis not present

## 2022-10-12 DIAGNOSIS — Z1612 Extended spectrum beta lactamase (ESBL) resistance: Secondary | ICD-10-CM | POA: Diagnosis not present

## 2022-10-12 DIAGNOSIS — E559 Vitamin D deficiency, unspecified: Secondary | ICD-10-CM | POA: Diagnosis not present

## 2022-10-12 DIAGNOSIS — Z886 Allergy status to analgesic agent status: Secondary | ICD-10-CM

## 2022-10-12 DIAGNOSIS — S065X0D Traumatic subdural hemorrhage without loss of consciousness, subsequent encounter: Secondary | ICD-10-CM | POA: Diagnosis not present

## 2022-10-12 DIAGNOSIS — I2510 Atherosclerotic heart disease of native coronary artery without angina pectoris: Secondary | ICD-10-CM | POA: Diagnosis present

## 2022-10-12 DIAGNOSIS — E118 Type 2 diabetes mellitus with unspecified complications: Secondary | ICD-10-CM | POA: Diagnosis not present

## 2022-10-12 DIAGNOSIS — Z87440 Personal history of urinary (tract) infections: Secondary | ICD-10-CM | POA: Diagnosis not present

## 2022-10-12 DIAGNOSIS — E43 Unspecified severe protein-calorie malnutrition: Secondary | ICD-10-CM | POA: Diagnosis not present

## 2022-10-12 DIAGNOSIS — H04123 Dry eye syndrome of bilateral lacrimal glands: Secondary | ICD-10-CM | POA: Diagnosis not present

## 2022-10-12 DIAGNOSIS — Z9181 History of falling: Secondary | ICD-10-CM | POA: Diagnosis not present

## 2022-10-12 DIAGNOSIS — I739 Peripheral vascular disease, unspecified: Secondary | ICD-10-CM | POA: Diagnosis not present

## 2022-10-12 DIAGNOSIS — Z803 Family history of malignant neoplasm of breast: Secondary | ICD-10-CM

## 2022-10-12 DIAGNOSIS — Z79899 Other long term (current) drug therapy: Secondary | ICD-10-CM

## 2022-10-12 DIAGNOSIS — R410 Disorientation, unspecified: Secondary | ICD-10-CM | POA: Diagnosis not present

## 2022-10-12 DIAGNOSIS — Z801 Family history of malignant neoplasm of trachea, bronchus and lung: Secondary | ICD-10-CM

## 2022-10-12 DIAGNOSIS — E538 Deficiency of other specified B group vitamins: Secondary | ICD-10-CM | POA: Diagnosis not present

## 2022-10-12 DIAGNOSIS — Z993 Dependence on wheelchair: Secondary | ICD-10-CM

## 2022-10-12 LAB — CBC
HCT: 38.6 % (ref 36.0–46.0)
Hemoglobin: 12.9 g/dL (ref 12.0–15.0)
MCH: 30.6 pg (ref 26.0–34.0)
MCHC: 33.4 g/dL (ref 30.0–36.0)
MCV: 91.5 fL (ref 80.0–100.0)
Platelets: 130 10*3/uL — ABNORMAL LOW (ref 150–400)
RBC: 4.22 MIL/uL (ref 3.87–5.11)
RDW: 15.1 % (ref 11.5–15.5)
WBC: 4.2 10*3/uL (ref 4.0–10.5)
nRBC: 0 % (ref 0.0–0.2)

## 2022-10-12 LAB — BASIC METABOLIC PANEL
Anion gap: 6 (ref 5–15)
BUN: 17 mg/dL (ref 8–23)
CO2: 32 mmol/L (ref 22–32)
Calcium: 11 mg/dL — ABNORMAL HIGH (ref 8.9–10.3)
Chloride: 97 mmol/L — ABNORMAL LOW (ref 98–111)
Creatinine, Ser: 0.84 mg/dL (ref 0.44–1.00)
GFR, Estimated: >60 mL/min (ref >60)
Glucose, Bld: 548 mg/dL (ref 70–99)
Potassium: 3.8 mmol/L (ref 3.5–5.1)
Sodium: 135 mmol/L (ref 135–145)

## 2022-10-12 LAB — I-STAT VENOUS BLOOD GAS, ED
Acid-Base Excess: 7 mmol/L — ABNORMAL HIGH (ref 0.0–2.0)
Bicarbonate: 32.9 mmol/L — ABNORMAL HIGH (ref 20.0–28.0)
Calcium, Ion: 1.45 mmol/L — ABNORMAL HIGH (ref 1.15–1.40)
HCT: 37 % (ref 36.0–46.0)
Hemoglobin: 12.6 g/dL (ref 12.0–15.0)
O2 Saturation: 46 %
Potassium: 3.4 mmol/L — ABNORMAL LOW (ref 3.5–5.1)
Sodium: 136 mmol/L (ref 135–145)
TCO2: 34 mmol/L — ABNORMAL HIGH (ref 22–32)
pCO2, Ven: 51.8 mmHg (ref 44–60)
pH, Ven: 7.411 (ref 7.25–7.43)
pO2, Ven: 26 mmHg — CL (ref 32–45)

## 2022-10-12 LAB — CBG MONITORING, ED: Glucose-Capillary: 504 mg/dL (ref 70–99)

## 2022-10-12 MED ORDER — LACTATED RINGERS IV BOLUS
1000.0000 mL | Freq: Once | INTRAVENOUS | Status: AC
  Administered 2022-10-12: 1000 mL via INTRAVENOUS

## 2022-10-12 MED ORDER — INSULIN ASPART 100 UNIT/ML IJ SOLN
4.0000 [IU] | Freq: Once | INTRAMUSCULAR | Status: AC
  Administered 2022-10-12: 4 [IU] via SUBCUTANEOUS

## 2022-10-12 NOTE — Telephone Encounter (Signed)
I refilled med; pt is on this med

## 2022-10-12 NOTE — Telephone Encounter (Signed)
Refill request for Jardiance received from pharmacy. Medication is not on active medication list, is patient suppose to be taking this medication. Please advise.  Message sent to Dr. Jacalyn Lefevre

## 2022-10-12 NOTE — ED Provider Notes (Signed)
[facility] Emergency Department Provider Note MRN:  [id number]  Arrival date & time: 10/13/22     Chief Complaint   Hyperglycemia   History of Present Illness   [name] is a 77 y.o. year-old female with a history of CHF, CKD, CAD presenting to the ED with chief complaint of hyperglycemia.  High blood sugars for the past few weeks, has been following up with her doctor and some changes have been made to her insulin regimen.  Read greater than 600 at home.  She feels some mild malaise but otherwise feels well, no pain, no recent fever or cough or burning with urination though she has been having frequent UTIs recently.  Review of Systems  A thorough review of systems was obtained and all systems are negative except as noted in the HPI and PMH.   Patient's Health History    Past Medical History:  Diagnosis Date   Chronic cystitis    Chronic diastolic CHF (congestive heart failure) (HCC)    followed by dr Eden Emms   Chronic kidney disease, stage I    Cirrhosis of liver without ascites (HCC) 03/2016   followed by dr Christella Hartigan (GI);   due to fatty liver;   post op cabg 09/ 2017  , post paracentesis for ascites   Coronary artery disease 01/2016   cardiologist--- dr Estrella Myrtle;   01-27-2016  NSTEMI  s/p cardiac cath;   s/p CABG 01/29/16: LIMA-LAD, SVG-RI, SVG-dRCA   DDD (degenerative disc disease), cervical    per pt w/ chronic neck pain   Dyspnea    occasional   Edema of right lower extremity    Esophageal varices (HCC)    followed by dr Christella Hartigan;   hx multiple banding of large varices   Essential hypertension    no meds   GERD (gastroesophageal reflux disease)    Heart murmur     DX FOR YEARS ASYMPTOMATIC   Hepatic encephalopathy (HCC)    History of adenomatous polyp of colon    History of COVID-19 04/2020   per pt mild  symptoms that resolve   History of kidney stones    passed stones   History of non-ST elevation myocardial infarction (NSTEMI)  01/27/2016   Hyperlipidemia    diet controlled   NAFLD (nonalcoholic fatty liver disease)    Neuropathy, peripheral 04/01/2015   OA (osteoarthritis)    Paroxysmal A-fib (HCC)    08/19/19 afib with RVR, spontaneously converted to SR   Portal vein thrombosis    per dr Christella Hartigan  ,  chronic non-occluded   Thrombocytopenia (HCC)    followed by pcp  and hematology-- dr Mosetta Putt;   likely secondary to liver cirrhosis   Type 2 diabetes mellitus treated with insulin (HCC)    Type 2, followed by pcp    (07-21-2021  per pt checks blood sugar dialy in am fasting average 113--200s)   Urge incontinence of urine    Wears hearing aid in both ears     Past Surgical History:  Procedure Laterality Date   CARDIAC CATHETERIZATION N/A 01/27/2016   Procedure: Left Heart Cath and Coronary Angiography;  Surgeon: Lyn Records, MD;  Location: [facility] INVASIVE CV LAB;  Service: Cardiovascular;  Laterality: N/A;   COLONOSCOPY WITH PROPOFOL N/A 07/07/2016   Procedure: COLONOSCOPY WITH PROPOFOL;  Surgeon: Rachael Fee, MD;  Location: [facility] ENDOSCOPY;  Service: Endoscopy;  Laterality: N/A;   CORONARY ARTERY BYPASS GRAFT N/A 01/28/2016   Procedure: CORONARY ARTERY BYPASS GRAFTING (  CABG) x 3 USING RIGHT LEG GREATER SAPHENOUS VEIN GRAFT;  Surgeon: Loreli Slot, MD;  Location: [facility] OR;  Service: Open Heart Surgery;  Laterality: N/A;   CYSTOSCOPY WITH RETROGRADE PYELOGRAM, URETEROSCOPY AND STENT PLACEMENT Left 05/30/2021   Procedure: CYSTOSCOPY WITH RETROGRADE PYELOGRAM, URETEROSCOPY AND STENT PLACEMENT;  Surgeon: Rene Paci, MD;  Location: [facility] OR;  Service: Urology;  Laterality: Left;   CYSTOSCOPY/URETEROSCOPY/HOLMIUM LASER/STENT PLACEMENT Left 07/26/2021   Procedure: CYSTOSCOPY/RETROGRADE/URETEROSCOPY/HOLMIUM LASER/STENT EXCHANGE;  Surgeon: Jannifer Hick, MD;  Location[facility];  Service: Urology;  Laterality: Left;   ENDOVEIN HARVEST OF GREATER SAPHENOUS VEIN Right 01/28/2016   Procedure:  ENDOVEIN HARVEST OF GREATER SAPHENOUS VEIN;  Surgeon: Loreli Slot, MD;  Location: [facility] OR;  Service: Open Heart Surgery;  Laterality: Right;   ESOPHAGEAL BANDING  03/28/2019   Procedure: ESOPHAGEAL BANDING;  Surgeon: Rachael Fee, MD;  Location: [facility] ENDOSCOPY;  Service: Endoscopy;;   ESOPHAGEAL BANDING  04/07/2019   Procedure: ESOPHAGEAL BANDING;  Surgeon: Charna Leanza, MD;  Location: [facility] ENDOSCOPY;  Service: Endoscopy;;   ESOPHAGOGASTRODUODENOSCOPY N/A 04/07/2019   Procedure: ESOPHAGOGASTRODUODENOSCOPY (EGD);  Surgeon: Charna Fallyn, MD;  Location: [facility] ENDOSCOPY;  Service: Endoscopy;  Laterality: N/A;   ESOPHAGOGASTRODUODENOSCOPY (EGD) WITH PROPOFOL N/A 07/07/2016   Procedure: ESOPHAGOGASTRODUODENOSCOPY (EGD) WITH PROPOFOL;  Surgeon: Rachael Fee, MD;  Location: [facility] ENDOSCOPY;  Service: Endoscopy;  Laterality: N/A;   ESOPHAGOGASTRODUODENOSCOPY (EGD) WITH PROPOFOL N/A 03/28/2019   Procedure: ESOPHAGOGASTRODUODENOSCOPY (EGD) WITH PROPOFOL;  Surgeon: Rachael Fee, MD;  Location: [facility] ENDOSCOPY;  Service: Endoscopy;  Laterality: N/A;   EXTRACORPOREAL SHOCK WAVE LITHOTRIPSY  2010   HEMOSTASIS CLIP PLACEMENT  04/07/2019   Procedure: HEMOSTASIS CLIP PLACEMENT;  Surgeon: Charna Rorie, MD;  Location: [facility] ENDOSCOPY;  Service: Endoscopy;;   IR ANGIOGRAM SELECTIVE EACH ADDITIONAL VESSEL  04/08/2019   IR EMBO ART  VEN HEMORR LYMPH EXTRAV  INC GUIDE ROADMAPPING  04/08/2019   IR PARACENTESIS  04/08/2019   IR RADIOLOGIST EVAL & MGMT  06/13/2019   IR RADIOLOGIST EVAL & MGMT  09/25/2019   IR RADIOLOGIST EVAL & MGMT  07/07/2020   IR RADIOLOGIST EVAL & MGMT  07/30/2021   IR TIPS  04/08/2019   LAMINECTOMY WITH POSTERIOR LATERAL ARTHRODESIS LEVEL 1 Bilateral 02/25/2022   Procedure: Laminectomy and Foraminotomy - Lumbar four-Lumbar five - bilateral, posterolateral arthrodesis Lumbar four-five, extension of hardware Lumbar four-five;  Surgeon: Tia Alert, MD;  Location: [facility] OR;  Service: Neurosurgery;   Laterality: Bilateral;   MAXIMUM ACCESS (MAS)POSTERIOR LUMBAR INTERBODY FUSION (PLIF) 1 LEVEL Left 06/10/2015   Procedure: FOR MAXIMUM ACCESS (MAS) POSTERIOR LUMBAR INTERBODY FUSION (PLIF) LUMBAR THREE-FOUR EXTRAFORAMINAL MICRODISCECTOMY LUMBAR FIVE-SACRAL ONE LEFT;  Surgeon: Tia Alert, MD;  Location: [facility] NEURO ORS;  Service: Neurosurgery;  Laterality: Left;   RADIOLOGY WITH ANESTHESIA N/A 04/08/2019   Procedure: RADIOLOGY WITH ANESTHESIA;  Surgeon: Radiologist, Medication, MD;  Location: [facility] OR;  Service: Radiology;  Laterality: N/A;   SCLEROTHERAPY  04/07/2019   Procedure: SCLEROTHERAPY;  Surgeon: Charna Latash, MD;  Location: [facility] ENDOSCOPY;  Service: Endoscopy;;   TEE WITHOUT CARDIOVERSION N/A 01/28/2016   Procedure: TRANSESOPHAGEAL ECHOCARDIOGRAM (TEE);  Surgeon: Loreli Slot, MD;  Location: [facility] OR;  Service: Open Heart Surgery;  Laterality: N/A;   TUBAL LIGATION  1982   Dr Ninetta Lights HERNIA REPAIR N/A 09/10/2019   Procedure: HERNIA REPAIR UMBILICAL ADULT;  Surgeon: Manus Rudd, MD;  Location: [facility] OR;  Service: General;  Laterality: N/A;   VAGINAL HYSTERECTOMY  1997   Dr Vivien Rota    Family History  Problem Relation Age of Onset   Heart disease Mother    Breast cancer Mother    Lung cancer Father    Arthritis Sister    Arthritis Brother    Liver cancer Brother    Lymphoma Brother    Heart disease Maternal Grandmother    Heart disease Maternal Grandfather    Heart disease Paternal Grandmother    Heart disease Paternal Grandfather    Other Daughter        house fire   Breast cancer Maternal Aunt    Breast cancer Paternal Aunt    Colon cancer Neg Hx    Esophageal cancer Neg Hx    Rectal cancer Neg Hx    Stomach cancer Neg Hx     Social History   Socioeconomic History   Marital status: Married    Spouse name: Not on file   Number of children: 6   Years of education: Not on file   Highest education level: Not on file  Occupational History   Occupation:  retired  Tobacco Use   Smoking status: Never   Smokeless tobacco: Never  Vaping Use   Vaping Use: Never used  Substance and Sexual Activity   Alcohol use: No   Drug use: Never   Sexual activity: Not Currently    Partners: Male    Birth control/protection: Surgical    Comment: Hysterectomy  Other Topics Concern   Not on file  Social History Narrative   She is retired she is married and lives with her husband   6 children   No alcohol or tobacco never smoker no drug use   Social Determinants of Corporate investment banker Strain: Low Risk  (05/29/2017)   Overall Financial Resource Strain (CARDIA)    Difficulty of Paying Living Expenses: Not hard at all  Food Insecurity: No Food Insecurity (09/12/2022)   Hunger Vital Sign    Worried About Running Out of Food in the Last Year: Never true    Ran Out of Food in the Last Year: Never true  Transportation Needs: No Transportation Needs (09/12/2022)   PRAPARE - Administrator, Civil Service (Medical): No    Lack of Transportation (Non-Medical): No  Physical Activity: Inactive (05/29/2017)   Exercise Vital Sign    Days of Exercise per Week: 0 days    Minutes of Exercise per Session: 0 min  Stress: No Stress Concern Present (05/29/2017)   [employer] of Occupational Health - Occupational Stress Questionnaire    Feeling of Stress : Not at all  Social Connections: Moderately Integrated (05/29/2017)   Social Connection and Isolation Panel [NHANES]    Frequency of Communication with Friends and Family: More than three times a week    Frequency of Social Gatherings with Friends and Family: Twice a week    Attends Religious Services: More than 4 times per year    Active Member of [employer] or Organizations: No    Attends Banker Meetings: Never    Marital Status: Married  Catering manager Violence: Not At Risk (08/21/2022)   Humiliation, Afraid, Rape, and Kick questionnaire    Fear of Current or Ex-Partner: No     Emotionally Abused: No    Physically Abused: No    Sexually Abused: No     Physical Exam   Vitals:   10/13/22 0145 10/13/22 0200  BP:  (!) 154/73  Pulse: 71  Resp: 16 14  Temp:    SpO2: 97%     CONSTITUTIONAL: Chronically ill-appearing, NAD NEURO/PSYCH:  Alert and oriented x 3, no focal deficits EYES:  eyes equal and reactive ENT/NECK:  no LAD, no JVD CARDIO: Regular rate, well-perfused, normal S1 and S2 PULM:  CTAB no wheezing or rhonchi GI/GU:  non-distended, non-tender MSK/SPINE:  No gross deformities, no edema SKIN:  no rash, atraumatic   *Additional and/or pertinent findings included in MDM below  Diagnostic and Interventional Summary    EKG Interpretation  Date/Time:    Ventricular Rate:    PR Interval:    QRS Duration:   QT Interval:    QTC Calculation:   R Axis:     Text Interpretation:         Labs Reviewed  BASIC METABOLIC PANEL - Abnormal; Notable for the following components:      Result Value   Chloride 97 (*)    Glucose, Bld 548 (*)    Calcium 11.0 (*)    All other components within normal limits  CBC - Abnormal; Notable for the following components:   Platelets 130 (*)    All other components within normal limits  URINALYSIS, ROUTINE W REFLEX MICROSCOPIC - Abnormal; Notable for the following components:   Specific Gravity, Urine 1.033 (*)    Glucose, UA >1,000 (*)    Nitrite POSITIVE (*)    Leukocytes,Ua MODERATE (*)    Bacteria, UA RARE (*)    Non Squamous Epithelial 0-5 (*)    All other components within normal limits  CBG MONITORING, ED - Abnormal; Notable for the following components:   Glucose-Capillary 504 (*)    All other components within normal limits  I-STAT VENOUS BLOOD GAS, ED - Abnormal; Notable for the following components:   pO2, Ven 26 (*)    Bicarbonate 32.9 (*)    TCO2 34 (*)    Acid-Base Excess 7.0 (*)    Potassium 3.4 (*)    Calcium, Ion 1.45 (*)    All other components within normal limits  CBG MONITORING,  ED - Abnormal; Notable for the following components:   Glucose-Capillary 375 (*)    All other components within normal limits  CBG MONITORING, ED - Abnormal; Notable for the following components:   Glucose-Capillary 326 (*)    All other components within normal limits  CBG MONITORING, ED  CBG MONITORING, ED    No orders to display    Medications  lactated ringers bolus 1,000 mL (0 mLs Intravenous Stopped 10/13/22 0046)  insulin aspart (novoLOG) injection 4 Units (4 Units Subcutaneous Given 10/12/22 2328)  meropenem (MERREM) 1 g in sodium chloride 0.9 % 100 mL IVPB (0 g Intravenous Stopped 10/13/22 0134)     Procedures  /  Critical Care Procedures  ED Course and Medical Decision Making  Initial Impression and Ddx Hyperglycemia, also having polyuria, polydipsia.  Differential diagnosis includes dehydration, AKI, doubt DKA but possible.  Infection considered but patient has no fever, no cough, UTI is a possibility.  Providing fluids, dose of insulin, will reassess.  Past medical/surgical history that increases complexity of ED encounter: CAD, CKD  Interpretation of Diagnostics I personally reviewed the laboratory assessment and my interpretation is as follows: No significant blood count or electrolyte disturbance with the exception of blood glucose greater than 500.  Urinalysis with evidence to suggest infection.  Upon chart review, patient has a fairly recent history of ESBL.  Patient Reassessment and Ultimate Disposition/Management  Providing patient with meropenem, will admit to medicine.  Patient management required discussion with the following services or consulting groups:  Hospitalist Service  Complexity of Problems Addressed Acute illness or injury that poses threat of life of bodily function  Additional Data Reviewed and Analyzed Further history obtained from: Further history from spouse/family member  Additional Factors Impacting ED Encounter Risk Consideration of  hospitalization  Elmer Sow. Pilar Plate, MD [facility] Emergency Medicine [facility] [email or web address]  Final Clinical Impressions(s) / ED Diagnoses     ICD-10-CM   1. ESBL (extended spectrum beta-lactamase) producing bacteria infection  A49.9    Z16.12     2. Hyperglycemia  R73.9       ED Discharge Orders     None        Discharge Instructions Discussed with and Provided to Patient:   Discharge Instructions   None      Sabas Sous, MD 10/13/22 [phone]

## 2022-10-12 NOTE — ED Notes (Signed)
Date and time results received: 10/12/22 2228 (use smartphrase ".now" to insert current time)  Test: glucose Critical Value: 548  Name of Provider Notified: Doreene Eland, MD  Orders Received? Or Actions Taken?:  n/a

## 2022-10-12 NOTE — ED Triage Notes (Signed)
From home. Notes high blood sugar over the last few days Levemir 15unit given around 6pm Second levemir 15units given at 8:30pm Drinking a lot, polyuria  No complaints from patient

## 2022-10-13 DIAGNOSIS — K219 Gastro-esophageal reflux disease without esophagitis: Secondary | ICD-10-CM | POA: Diagnosis not present

## 2022-10-13 DIAGNOSIS — N3000 Acute cystitis without hematuria: Secondary | ICD-10-CM | POA: Diagnosis not present

## 2022-10-13 DIAGNOSIS — I252 Old myocardial infarction: Secondary | ICD-10-CM | POA: Diagnosis not present

## 2022-10-13 DIAGNOSIS — Z8616 Personal history of COVID-19: Secondary | ICD-10-CM | POA: Diagnosis not present

## 2022-10-13 DIAGNOSIS — Z91040 Latex allergy status: Secondary | ICD-10-CM | POA: Diagnosis not present

## 2022-10-13 DIAGNOSIS — I2510 Atherosclerotic heart disease of native coronary artery without angina pectoris: Secondary | ICD-10-CM | POA: Diagnosis not present

## 2022-10-13 DIAGNOSIS — N181 Chronic kidney disease, stage 1: Secondary | ICD-10-CM | POA: Diagnosis not present

## 2022-10-13 DIAGNOSIS — E785 Hyperlipidemia, unspecified: Secondary | ICD-10-CM | POA: Diagnosis not present

## 2022-10-13 DIAGNOSIS — K7460 Unspecified cirrhosis of liver: Secondary | ICD-10-CM | POA: Diagnosis not present

## 2022-10-13 DIAGNOSIS — E1165 Type 2 diabetes mellitus with hyperglycemia: Secondary | ICD-10-CM | POA: Diagnosis not present

## 2022-10-13 DIAGNOSIS — E1122 Type 2 diabetes mellitus with diabetic chronic kidney disease: Secondary | ICD-10-CM | POA: Diagnosis not present

## 2022-10-13 DIAGNOSIS — Z981 Arthrodesis status: Secondary | ICD-10-CM | POA: Diagnosis not present

## 2022-10-13 DIAGNOSIS — Z9102 Food additives allergy status: Secondary | ICD-10-CM | POA: Diagnosis not present

## 2022-10-13 DIAGNOSIS — Z888 Allergy status to other drugs, medicaments and biological substances status: Secondary | ICD-10-CM | POA: Diagnosis not present

## 2022-10-13 DIAGNOSIS — B957 Other staphylococcus as the cause of diseases classified elsewhere: Secondary | ICD-10-CM | POA: Diagnosis not present

## 2022-10-13 DIAGNOSIS — Z886 Allergy status to analgesic agent status: Secondary | ICD-10-CM | POA: Diagnosis not present

## 2022-10-13 DIAGNOSIS — I130 Hypertensive heart and chronic kidney disease with heart failure and stage 1 through stage 4 chronic kidney disease, or unspecified chronic kidney disease: Secondary | ICD-10-CM | POA: Diagnosis not present

## 2022-10-13 DIAGNOSIS — E1142 Type 2 diabetes mellitus with diabetic polyneuropathy: Secondary | ICD-10-CM | POA: Diagnosis not present

## 2022-10-13 DIAGNOSIS — N390 Urinary tract infection, site not specified: Secondary | ICD-10-CM | POA: Diagnosis present

## 2022-10-13 DIAGNOSIS — I480 Paroxysmal atrial fibrillation: Secondary | ICD-10-CM | POA: Diagnosis not present

## 2022-10-13 DIAGNOSIS — I5032 Chronic diastolic (congestive) heart failure: Secondary | ICD-10-CM | POA: Diagnosis not present

## 2022-10-13 DIAGNOSIS — B952 Enterococcus as the cause of diseases classified elsewhere: Secondary | ICD-10-CM | POA: Diagnosis not present

## 2022-10-13 DIAGNOSIS — E876 Hypokalemia: Secondary | ICD-10-CM | POA: Diagnosis not present

## 2022-10-13 DIAGNOSIS — K7581 Nonalcoholic steatohepatitis (NASH): Secondary | ICD-10-CM | POA: Diagnosis not present

## 2022-10-13 DIAGNOSIS — L89152 Pressure ulcer of sacral region, stage 2: Secondary | ICD-10-CM | POA: Diagnosis not present

## 2022-10-13 LAB — URINALYSIS, ROUTINE W REFLEX MICROSCOPIC
Bilirubin Urine: NEGATIVE
Glucose, UA: >1000 mg/dL — AB
Hgb urine dipstick: NEGATIVE
Ketones, ur: NEGATIVE mg/dL
Nitrite: POSITIVE — AB
Protein, ur: NEGATIVE mg/dL
Specific Gravity, Urine: 1.033 — ABNORMAL HIGH (ref 1.005–1.030)
WBC, UA: >50 WBC/hpf (ref 0–5)
pH: 6 (ref 5.0–8.0)

## 2022-10-13 LAB — GLUCOSE, CAPILLARY
Glucose-Capillary: 142 mg/dL — ABNORMAL HIGH (ref 70–99)
Glucose-Capillary: 178 mg/dL — ABNORMAL HIGH (ref 70–99)
Glucose-Capillary: 283 mg/dL — ABNORMAL HIGH (ref 70–99)
Glucose-Capillary: 351 mg/dL — ABNORMAL HIGH (ref 70–99)

## 2022-10-13 LAB — CBG MONITORING, ED
Glucose-Capillary: 238 mg/dL — ABNORMAL HIGH (ref 70–99)
Glucose-Capillary: 326 mg/dL — ABNORMAL HIGH (ref 70–99)
Glucose-Capillary: 375 mg/dL — ABNORMAL HIGH (ref 70–99)

## 2022-10-13 MED ORDER — TRAZODONE HCL 50 MG PO TABS
25.0000 mg | ORAL_TABLET | Freq: Every evening | ORAL | Status: DC | PRN
  Administered 2022-10-13 – 2022-10-17 (×5): 25 mg via ORAL
  Filled 2022-10-13 (×5): qty 1

## 2022-10-13 MED ORDER — INSULIN ASPART 100 UNIT/ML IJ SOLN
4.0000 [IU] | Freq: Every day | INTRAMUSCULAR | Status: DC
  Administered 2022-10-15 – 2022-10-18 (×4): 4 [IU] via SUBCUTANEOUS

## 2022-10-13 MED ORDER — INSULIN ASPART 100 UNIT/ML IJ SOLN: 4.0000 [IU] | Freq: Three times a day (TID) | INTRAMUSCULAR | Status: DC

## 2022-10-13 MED ORDER — ONDANSETRON HCL 4 MG/2ML IJ SOLN: 4.0000 mg | Freq: Four times a day (QID) | INTRAMUSCULAR | Status: DC | PRN

## 2022-10-13 MED ORDER — FUROSEMIDE 40 MG PO TABS
40.0000 mg | ORAL_TABLET | Freq: Every day | ORAL | Status: DC
  Administered 2022-10-13: 40 mg via ORAL
  Filled 2022-10-13: qty 1

## 2022-10-13 MED ORDER — ARTIFICIAL TEARS OPHTHALMIC OINT
TOPICAL_OINTMENT | Freq: Every day | OPHTHALMIC | Status: DC
  Administered 2022-10-14 – 2022-10-17 (×3): 1 via OPHTHALMIC
  Filled 2022-10-13 (×2): qty 3.5

## 2022-10-13 MED ORDER — INSULIN ASPART 100 UNIT/ML IJ SOLN
0.0000 [IU] | Freq: Every day | INTRAMUSCULAR | Status: DC
  Administered 2022-10-13: 5 [IU] via SUBCUTANEOUS
  Administered 2022-10-14 – 2022-10-16 (×2): 2 [IU] via SUBCUTANEOUS
  Administered 2022-10-17: 3 [IU] via SUBCUTANEOUS

## 2022-10-13 MED ORDER — POLYETHYLENE GLYCOL 3350 17 G PO PACK: 17.0000 g | PACK | Freq: Every day | ORAL | Status: DC | PRN

## 2022-10-13 MED ORDER — RIFAXIMIN 550 MG PO TABS
550.0000 mg | ORAL_TABLET | Freq: Two times a day (BID) | ORAL | Status: DC
  Administered 2022-10-13 – 2022-10-18 (×11): 550 mg via ORAL
  Filled 2022-10-13 (×11): qty 1

## 2022-10-13 MED ORDER — PANTOPRAZOLE SODIUM 40 MG PO TBEC
40.0000 mg | DELAYED_RELEASE_TABLET | Freq: Every day | ORAL | Status: DC
  Administered 2022-10-13 – 2022-10-18 (×6): 40 mg via ORAL
  Filled 2022-10-13 (×6): qty 1

## 2022-10-13 MED ORDER — ZINC OXIDE 40 % EX OINT
TOPICAL_OINTMENT | Freq: Three times a day (TID) | CUTANEOUS | Status: AC
  Administered 2022-10-13: 1 via TOPICAL
  Filled 2022-10-13: qty 57

## 2022-10-13 MED ORDER — INSULIN GLARGINE-YFGN 100 UNIT/ML ~~LOC~~ SOLN
25.0000 [IU] | Freq: Every day | SUBCUTANEOUS | Status: DC
  Administered 2022-10-13 – 2022-10-17 (×5): 25 [IU] via SUBCUTANEOUS
  Filled 2022-10-13 (×6): qty 0.25

## 2022-10-13 MED ORDER — LACTATED RINGERS IV SOLN
INTRAVENOUS | Status: DC
  Administered 2022-10-13: 1000 mL via INTRAVENOUS

## 2022-10-13 MED ORDER — INSULIN ASPART 100 UNIT/ML IJ SOLN
8.0000 [IU] | Freq: Every day | INTRAMUSCULAR | Status: DC
  Administered 2022-10-13 – 2022-10-17 (×5): 8 [IU] via SUBCUTANEOUS

## 2022-10-13 MED ORDER — ACETAMINOPHEN 325 MG PO TABS
650.0000 mg | ORAL_TABLET | Freq: Four times a day (QID) | ORAL | Status: DC | PRN
  Administered 2022-10-13 – 2022-10-18 (×11): 650 mg via ORAL
  Filled 2022-10-13 (×11): qty 2

## 2022-10-13 MED ORDER — ALBUTEROL SULFATE (2.5 MG/3ML) 0.083% IN NEBU: 2.5000 mg | INHALATION_SOLUTION | RESPIRATORY_TRACT | Status: DC | PRN

## 2022-10-13 MED ORDER — SODIUM CHLORIDE 0.9 % IV SOLN
1.0000 g | Freq: Once | INTRAVENOUS | Status: AC
  Administered 2022-10-13: 1 g via INTRAVENOUS

## 2022-10-13 MED ORDER — EZETIMIBE 10 MG PO TABS
10.0000 mg | ORAL_TABLET | Freq: Every day | ORAL | Status: DC
  Administered 2022-10-13 – 2022-10-18 (×6): 10 mg via ORAL
  Filled 2022-10-13 (×6): qty 1

## 2022-10-13 MED ORDER — LACTULOSE 10 GM/15ML PO SOLN
20.0000 g | Freq: Every day | ORAL | Status: DC
  Administered 2022-10-13 – 2022-10-18 (×6): 20 g via ORAL
  Filled 2022-10-13 (×6): qty 30

## 2022-10-13 MED ORDER — SODIUM CHLORIDE 0.9 % IV SOLN
1.0000 g | Freq: Two times a day (BID) | INTRAVENOUS | Status: DC
  Administered 2022-10-13 (×2): 1 g via INTRAVENOUS
  Filled 2022-10-13 (×4): qty 20

## 2022-10-13 MED ORDER — INSULIN ASPART 100 UNIT/ML IJ SOLN
6.0000 [IU] | Freq: Every day | INTRAMUSCULAR | Status: DC
  Administered 2022-10-14 – 2022-10-18 (×5): 6 [IU] via SUBCUTANEOUS

## 2022-10-13 MED ORDER — ACETAMINOPHEN 650 MG RE SUPP: 650.0000 mg | Freq: Four times a day (QID) | RECTAL | Status: DC | PRN

## 2022-10-13 MED ORDER — METHOCARBAMOL 500 MG PO TABS
500.0000 mg | ORAL_TABLET | Freq: Three times a day (TID) | ORAL | Status: DC | PRN
  Administered 2022-10-13 – 2022-10-17 (×9): 500 mg via ORAL
  Filled 2022-10-13 (×12): qty 1

## 2022-10-13 MED ORDER — INSULIN ASPART 100 UNIT/ML IJ SOLN
0.0000 [IU] | Freq: Three times a day (TID) | INTRAMUSCULAR | Status: DC
  Administered 2022-10-13: 2 [IU] via SUBCUTANEOUS
  Administered 2022-10-13: 8 [IU] via SUBCUTANEOUS
  Administered 2022-10-13: 3 [IU] via SUBCUTANEOUS
  Administered 2022-10-14 (×2): 11 [IU] via SUBCUTANEOUS
  Administered 2022-10-14: 8 [IU] via SUBCUTANEOUS
  Administered 2022-10-15: 11 [IU] via SUBCUTANEOUS
  Administered 2022-10-15: 3 [IU] via SUBCUTANEOUS
  Administered 2022-10-15: 8 [IU] via SUBCUTANEOUS
  Administered 2022-10-16: 3 [IU] via SUBCUTANEOUS
  Administered 2022-10-16: 11 [IU] via SUBCUTANEOUS
  Administered 2022-10-16: 8 [IU] via SUBCUTANEOUS
  Administered 2022-10-17 (×2): 5 [IU] via SUBCUTANEOUS
  Administered 2022-10-17: 2 [IU] via SUBCUTANEOUS
  Administered 2022-10-18: 8 [IU] via SUBCUTANEOUS
  Administered 2022-10-18: 2 [IU] via SUBCUTANEOUS

## 2022-10-13 MED ORDER — ONDANSETRON HCL 4 MG PO TABS: 4.0000 mg | ORAL_TABLET | Freq: Four times a day (QID) | ORAL | Status: DC | PRN

## 2022-10-13 NOTE — H&P (Signed)
History and Physical  [name] ZOX:[id number] DOB: 01/19/1946 DOA: 10/12/2022  PCP: Frederica Kuster, MD   Chief Complaint: hyperglycemia   HPI: [name] is a 77 y.o. female with medical history significant for Elita Boone cirrhosis status post TIPS, chronic diastolic congestive heart failure, chronic anemia, insulin-dependent type 2 diabetes, hypertension, hyperlipidemia, anxiety, subdural hemorrhage April 2024 who has had multiple Klebsiella ESBL UTIs and is being admitted to the hospital with concern for recurrent UTI.  History is provided by the patient, as well as my discussion over the phone this morning with the patient's daughter [name].  States that she has been in her usual state of health, the patient specifically denies abdominal pain, dysuria, fevers, nausea, vomiting.  Daughter states patient has been slightly more weak than usual, though she is wheelchair-bound at baseline since her subarachnoid hemorrhage earlier in the year.  They did notice for the last few days that her blood sugar has been very elevated, has been reading high at home.  ED Course: Patient presented to [facility] emergency room last evening.  Vital signs were normal other than some hypertension.  Blood sugar was 548.  Rest of her labs were quite unremarkable.  She was given IV fluids, 4 units of NovoLog, as well as IV meropenem after UA came back abnormal.  Hospitalist was contacted, and the patient was accepted to [facility] for observation admission.  Review of Systems: Please see HPI for pertinent positives and negatives. A complete 10 system review of systems are otherwise negative.  Past Medical History:  Diagnosis Date   Chronic cystitis    Chronic diastolic CHF (congestive heart failure) (HCC)    followed by dr Eden Emms   Chronic kidney disease, stage I    Cirrhosis of liver without ascites (HCC) 03/2016   followed by dr Christella Hartigan (GI);   due to fatty liver;   post op cabg 09/ 2017  ,  post paracentesis for ascites   Coronary artery disease 01/2016   cardiologist--- dr Estrella Myrtle;   01-27-2016  NSTEMI  s/p cardiac cath;   s/p CABG 01/29/16: LIMA-LAD, SVG-RI, SVG-dRCA   DDD (degenerative disc disease), cervical    per pt w/ chronic neck pain   Dyspnea    occasional   Edema of right lower extremity    Esophageal varices (HCC)    followed by dr Christella Hartigan;   hx multiple banding of large varices   Essential hypertension    no meds   GERD (gastroesophageal reflux disease)    Heart murmur     DX FOR YEARS ASYMPTOMATIC   Hepatic encephalopathy (HCC)    History of adenomatous polyp of colon    History of COVID-19 04/2020   per pt mild  symptoms that resolve   History of kidney stones    passed stones   History of non-ST elevation myocardial infarction (NSTEMI) 01/27/2016   Hyperlipidemia    diet controlled   NAFLD (nonalcoholic fatty liver disease)    Neuropathy, peripheral 04/01/2015   OA (osteoarthritis)    Paroxysmal A-fib (HCC)    08/19/19 afib with RVR, spontaneously converted to SR   Portal vein thrombosis    per dr Christella Hartigan  ,  chronic non-occluded   Thrombocytopenia (HCC)    followed by pcp  and hematology-- dr Mosetta Putt;   likely secondary to liver cirrhosis   Type 2 diabetes mellitus treated with insulin (HCC)    Type 2, followed by pcp    (07-21-2021  per pt checks blood  sugar dialy in am fasting average 113--200s)   Urge incontinence of urine    Wears hearing aid in both ears    Past Surgical History:  Procedure Laterality Date   CARDIAC CATHETERIZATION N/A 01/27/2016   Procedure: Left Heart Cath and Coronary Angiography;  Surgeon: Lyn Records, MD;  Location: [facility] INVASIVE CV LAB;  Service: Cardiovascular;  Laterality: N/A;   COLONOSCOPY WITH PROPOFOL N/A 07/07/2016   Procedure: COLONOSCOPY WITH PROPOFOL;  Surgeon: Rachael Fee, MD;  Location: [facility] ENDOSCOPY;  Service: Endoscopy;  Laterality: N/A;   CORONARY ARTERY BYPASS GRAFT N/A 01/28/2016   Procedure: CORONARY  ARTERY BYPASS GRAFTING (CABG) x 3 USING RIGHT LEG GREATER SAPHENOUS VEIN GRAFT;  Surgeon: Loreli Slot, MD;  Location: [facility] OR;  Service: Open Heart Surgery;  Laterality: N/A;   CYSTOSCOPY WITH RETROGRADE PYELOGRAM, URETEROSCOPY AND STENT PLACEMENT Left 05/30/2021   Procedure: CYSTOSCOPY WITH RETROGRADE PYELOGRAM, URETEROSCOPY AND STENT PLACEMENT;  Surgeon: Rene Paci, MD;  Location: [facility] OR;  Service: Urology;  Laterality: Left;   CYSTOSCOPY/URETEROSCOPY/HOLMIUM LASER/STENT PLACEMENT Left 07/26/2021   Procedure: CYSTOSCOPY/RETROGRADE/URETEROSCOPY/HOLMIUM LASER/STENT EXCHANGE;  Surgeon: Jannifer Hick, MD;  Location: [facility];  Service: Urology;  Laterality: Left;   ENDOVEIN HARVEST OF GREATER SAPHENOUS VEIN Right 01/28/2016   Procedure: ENDOVEIN HARVEST OF GREATER SAPHENOUS VEIN;  Surgeon: Loreli Slot, MD;  Location: [facility] OR;  Service: Open Heart Surgery;  Laterality: Right;   ESOPHAGEAL BANDING  03/28/2019   Procedure: ESOPHAGEAL BANDING;  Surgeon: Rachael Fee, MD;  Location: [facility] ENDOSCOPY;  Service: Endoscopy;;   ESOPHAGEAL BANDING  04/07/2019   Procedure: ESOPHAGEAL BANDING;  Surgeon: Charna Ysabela, MD;  Location: [facility] ENDOSCOPY;  Service: Endoscopy;;   ESOPHAGOGASTRODUODENOSCOPY N/A 04/07/2019   Procedure: ESOPHAGOGASTRODUODENOSCOPY (EGD);  Surgeon: Charna Emer, MD;  Location: [facility] ENDOSCOPY;  Service: Endoscopy;  Laterality: N/A;   ESOPHAGOGASTRODUODENOSCOPY (EGD) WITH PROPOFOL N/A 07/07/2016   Procedure: ESOPHAGOGASTRODUODENOSCOPY (EGD) WITH PROPOFOL;  Surgeon: Rachael Fee, MD;  Location: [facility] ENDOSCOPY;  Service: Endoscopy;  Laterality: N/A;   ESOPHAGOGASTRODUODENOSCOPY (EGD) WITH PROPOFOL N/A 03/28/2019   Procedure: ESOPHAGOGASTRODUODENOSCOPY (EGD) WITH PROPOFOL;  Surgeon: Rachael Fee, MD;  Location: [facility] ENDOSCOPY;  Service: Endoscopy;  Laterality: N/A;   EXTRACORPOREAL SHOCK WAVE LITHOTRIPSY  2010   HEMOSTASIS CLIP PLACEMENT  04/07/2019    Procedure: HEMOSTASIS CLIP PLACEMENT;  Surgeon: Charna Fathima, MD;  Location: [facility] ENDOSCOPY;  Service: Endoscopy;;   IR ANGIOGRAM SELECTIVE EACH ADDITIONAL VESSEL  04/08/2019   IR EMBO ART  VEN HEMORR LYMPH EXTRAV  INC GUIDE ROADMAPPING  04/08/2019   IR PARACENTESIS  04/08/2019   IR RADIOLOGIST EVAL & MGMT  06/13/2019   IR RADIOLOGIST EVAL & MGMT  09/25/2019   IR RADIOLOGIST EVAL & MGMT  07/07/2020   IR RADIOLOGIST EVAL & MGMT  07/30/2021   IR TIPS  04/08/2019   LAMINECTOMY WITH POSTERIOR LATERAL ARTHRODESIS LEVEL 1 Bilateral 02/25/2022   Procedure: Laminectomy and Foraminotomy - Lumbar four-Lumbar five - bilateral, posterolateral arthrodesis Lumbar four-five, extension of hardware Lumbar four-five;  Surgeon: Tia Alert, MD;  Location: [facility] OR;  Service: Neurosurgery;  Laterality: Bilateral;   MAXIMUM ACCESS (MAS)POSTERIOR LUMBAR INTERBODY FUSION (PLIF) 1 LEVEL Left 06/10/2015   Procedure: FOR MAXIMUM ACCESS (MAS) POSTERIOR LUMBAR INTERBODY FUSION (PLIF) LUMBAR THREE-FOUR EXTRAFORAMINAL MICRODISCECTOMY LUMBAR FIVE-SACRAL ONE LEFT;  Surgeon: Tia Alert, MD;  Location: [facility] NEURO ORS;  Service: Neurosurgery;  Laterality: Left;   RADIOLOGY WITH ANESTHESIA N/A 04/08/2019   Procedure: RADIOLOGY WITH ANESTHESIA;  Surgeon:  Radiologist, Medication, MD;  Location: [facility] OR;  Service: Radiology;  Laterality: N/A;   SCLEROTHERAPY  04/07/2019   Procedure: SCLEROTHERAPY;  Surgeon: Charna Jaylnn, MD;  Location: [facility] ENDOSCOPY;  Service: Endoscopy;;   TEE WITHOUT CARDIOVERSION N/A 01/28/2016   Procedure: TRANSESOPHAGEAL ECHOCARDIOGRAM (TEE);  Surgeon: Loreli Slot, MD;  Location: [facility] OR;  Service: Open Heart Surgery;  Laterality: N/A;   TUBAL LIGATION  1982   Dr Ninetta Lights HERNIA REPAIR N/A 09/10/2019   Procedure: HERNIA REPAIR UMBILICAL ADULT;  Surgeon: Manus Rudd, MD;  Location: [facility] OR;  Service: General;  Laterality: N/A;   VAGINAL HYSTERECTOMY  1997   Dr Vivien Rota    Social History:  reports  that she has never smoked. She has never used smokeless tobacco. She reports that she does not drink alcohol and does not use drugs.   Allergies  Allergen Reactions   Kiwi Extract Anaphylaxis   Gabapentin Nausea And Vomiting   Tdap [Tetanus-Diphth-Acell Pertussis] Swelling and Other (See Comments)    Swelling at injection site, gets very hot   Latex Itching, Dermatitis and Rash   Statins Other (See Comments)    RHABDOMYOLYSIS   Tramadol Nausea And Vomiting    Family History  Problem Relation Age of Onset   Heart disease Mother    Breast cancer Mother    Lung cancer Father    Arthritis Sister    Arthritis Brother    Liver cancer Brother    Lymphoma Brother    Heart disease Maternal Grandmother    Heart disease Maternal Grandfather    Heart disease Paternal Grandmother    Heart disease Paternal Grandfather    Other Daughter        house fire   Breast cancer Maternal Aunt    Breast cancer Paternal Aunt    Colon cancer Neg Hx    Esophageal cancer Neg Hx    Rectal cancer Neg Hx    Stomach cancer Neg Hx      Prior to Admission medications   Medication Sig Start Date End Date Taking? Authorizing Provider  acetaminophen (TYLENOL) 500 MG tablet Take 1,000 mg by mouth every 6 (six) hours as needed for moderate pain.    [provider]  artificial tears (LACRILUBE) OINT ophthalmic ointment Place into both eyes at bedtime. 09/09/22   Barnetta Chapel, MD  Calcium Carb-Cholecalciferol (CALCIUM 600 + D PO) Take 1 tablet by mouth in the morning and at bedtime.    [provider]  Cholecalciferol (VITAMIN D-3) 25 MCG (1000 UT) CAPS Take 1,000 Units by mouth daily.    [provider]  cyanocobalamin (VITAMIN B12) 1000 MCG tablet Take 1,000 mcg by mouth daily.    [provider]  ezetimibe (ZETIA) 10 MG tablet Take 1 tablet (10 mg total) by mouth daily. 02/09/22   Frederica Kuster, MD  fish oil-omega-3 fatty acids 1000 MG capsule Take 1 g by mouth  daily.    [provider]  furosemide (LASIX) 20 MG tablet Take 40 mg by mouth daily.    [provider]  glucose blood test strip One Touch Ultra Blue test strips. Use to test blood sugar three times daily. Dx: E11.65 02/09/22   Frederica Kuster, MD  insulin aspart (NOVOLOG) 100 UNIT/ML injection inject 4 units before breakfast. Inject 6 units before lunch. Inject 8 units before supper. Dx: E11.59 10/10/22   Sharon Seller, NP  Insulin Glargine (BASAGLAR KWIKPEN) 100 UNIT/ML Inject 30 Units into  the skin daily. 09/09/22   Berton Mount I, MD  Insulin Pen Needle (B-D ULTRAFINE III SHORT PEN) 31G X 8 MM MISC Use to give Insulin injections daily. Dx: E11.59 09/29/22   Frederica Kuster, MD  Insulin Syringe-Needle U-100 (INSULIN SYRINGE 1CC/31GX5/16") 31G X 5/16" 1 ML MISC USE AS DIRECTED TO INJECT LEVIMIR 07/29/20   Frederica Kuster, MD  lactulose (CHRONULAC) 10 GM/15ML solution TAKE 30 ML BY MOUTH EVERY DAY Patient taking differently: Take 20 g by mouth daily. 08/23/22   Lynann Bologna, MD  Lancets [facility] LANCET) MISC 1 Device by Does not apply route in the morning and at bedtime. 09/14/22   Frederica Kuster, MD  MAGNESIUM PO Take 1 tablet by mouth at bedtime.    [provider]  methocarbamol (ROBAXIN) 500 MG tablet Take 1 tablet (500 mg total) by mouth every 8 (eight) hours as needed (muscle spasm/pain). 09/22/22   Cathren Laine, MD  nystatin cream (MYCOSTATIN) Apply 1 Application topically 2 (two) times daily.    [provider]  pantoprazole (PROTONIX) 40 MG tablet TAKE 1 TABLET(40 MG) BY MOUTH DAILY Patient taking differently: Take 40 mg by mouth daily. 10/25/21   Frederica Kuster, MD  potassium chloride SA (KLOR-CON M) 20 MEQ tablet Take 1 tablet (20 mEq total) by mouth daily for 5 days. 10/07/22 10/12/22  Gowens, Mariah L, PA-C  rifaximin (XIFAXAN) 550 MG TABS tablet Take 550 mg by mouth 2 (two) times daily.    [provider]    Physical  Exam: BP 137/75 (BP Location: Right Arm)   Pulse 69   Temp 97.7 F (36.5 C) (Oral)   Resp 16   Ht 5' 2.99" (1.6 m)   Wt 55.3 kg   LMP  (LMP Unknown)   SpO2 100%   BMI 21.62 kg/m   General:  Alert, oriented, calm, in no acute distress, thin and chronically ill-appearing Eyes: EOMI, clear conjuctivae, white sclerea Neck: supple, no masses, trachea mildline  Cardiovascular: RRR, no murmurs or rubs, no peripheral edema  Respiratory: clear to auscultation bilaterally, no wheezes, no crackles  Abdomen: soft, nontender, nondistended, normal bowel tones heard  Skin: dry, no rashes  Musculoskeletal: no joint effusions, normal range of motion  Psychiatric: appropriate affect, normal speech  Neurologic: extraocular muscles intact, clear speech, moving all extremities with intact sensorium          Labs on Admission:  Basic Metabolic Panel: Recent Labs  Lab 10/07/22 1741 10/07/22 1749 10/07/22 2101 10/12/22 2159 10/12/22 2256  NA 134* 134*  --  135 136  K 2.9* 2.9*  --  3.8 3.4*  CL 94*  --   --  97*  --   CO2 31  --   --  32  --   GLUCOSE 447*  --   --  548*  --   BUN 21  --   --  17  --   CREATININE 0.71  --   --  0.84  --   CALCIUM 10.4*  --   --  11.0*  --   MG  --   --  1.8  --   --    Liver Function Tests: No results for input(s): "AST", "ALT", "ALKPHOS", "BILITOT", "PROT", "ALBUMIN" in the last 168 hours. No results for input(s): "LIPASE", "AMYLASE" in the last 168 hours. Recent Labs  Lab 10/07/22 2101  AMMONIA 12   CBC: Recent Labs  Lab 10/07/22 1650 10/07/22 1749 10/12/22 2159 10/12/22 2256  WBC 4.9  --  4.2  --   HGB 12.3 12.2 12.9 12.6  HCT 36.6 36.0 38.6 37.0  MCV 92.2  --  91.5  --   PLT 112*  --  130*  --    Cardiac Enzymes: No results for input(s): "CKTOTAL", "CKMB", "CKMBINDEX", "TROPONINI" in the last 168 hours.  BNP (last 3 results) Recent Labs    05/27/22 2208 06/30/22 1715  BNP 493.2* 630.2*    ProBNP (last 3 results) No results for  input(s): "PROBNP" in the last 8760 hours.  CBG: Recent Labs  Lab 10/07/22 1934 10/12/22 2146 10/13/22 0009 10/13/22 0134 10/13/22 0401  GLUCAP 298* 504* 375* 326* 238*    Radiological Exams on Admission: No results found.  Assessment/Plan [name] is a 77 y.o. female with medical history significant for Elita Boone cirrhosis status post TIPS, chronic diastolic congestive heart failure, chronic anemia, insulin-dependent type 2 diabetes, hypertension, hyperlipidemia, anxiety, subdural hemorrhage April 2024 who has had multiple Klebsiella ESBL UTIs and is being admitted to the hospital with concern for recurrent UTI.  Patient is not septic, she is hemodynamically stable.  Without fevers, chills, dysuria, however her hyperglycemia is likely due to the acute infection.  UTI-with history of Klebsiella ESBL -Observation admission -Obtain blood and urine cultures -Empiric IV meropenem  Insulin-dependent type 2 diabetes-presenting with hyperglycemia likely due to acute infection -Carb controlled diet -Lantus 25 units nightly (slight reduction from home dose) -NovoLog 4 units before breakfast, 6 units before lunch, 8 units before supper -Additionally, she will receive sliding scale  Chronic diastolic congestive heart failure-without evidence of acute exacerbation.  She has received LR at 100 cc/h, blood sugar is normalizing so we will discontinue this. -Continue home Lasix  NASH-continue Zetia, lactulose, rifaximin, Protonix  DVT prophylaxis: SCDs only due to history of subarachnoid hemorrhage    Code Status: Full Code-confirmed with the patient and her daughter over the phone  Consults called: None  Admission status:  observation  Time spent: 42 minutes  Kayla Zeidman Sharlette Dense MD [facility] Pager [phone]  If 7PM-7AM, please contact night-coverage [email or web address] Password [facility]  10/13/2022, 7:43 AM

## 2022-10-13 NOTE — ED Notes (Signed)
CBG 375 

## 2022-10-13 NOTE — Telephone Encounter (Signed)
Spoke with Vikki Ports and informed her of Dr. Rondel Baton response. She verbalized her understanding

## 2022-10-13 NOTE — Progress Notes (Signed)
Plan of Care Note for accepted transfer   Patient: [name] MRN: [id number]   DOA: 10/12/2022  Facility requesting transfer: [facility] ED  Requesting Provider: Dr. Pilar Plate  Reason for transfer: Hyperglycemia, UTI  Facility course: 77 year old female with history of NASH cirrhosis status post TIPS, chronic diastolic CHF, chronic anemia, type 2 diabetes, hypertension, hyperlipidemia, thrombocytopenia, anxiety, admitted 4/14-4/16/2024 for subdural/subarachnoid hemorrhage secondary to mechanical fall, admitted again 4/26-5/3 for UTI and urine culture grew Klebsiella ESBL and was treated with IV meropenem.  She presents to the ED for evaluation of hyperglycemia.  Slightly hypertensive but vital signs otherwise stable.  Labs showing no leukocytosis, glucose 548, bicarb 32, anion gap 6, VBG with normal pH, calcium 11.0.  UA with positive nitrite, moderate leukocytes, and microscopy showing greater than 50 WBCs.  Patient was given NovoLog 4 units and 1 L IV fluid bolus after which glucose improved to 300s.  She was also given meropenem.  Plan of care: The patient is accepted for admission to Med-surg  unit, at [facility].  [facility] will assume care on arrival to accepting facility. Until arrival, care as per EDP. However, [facility] available 24/7 for questions and assistance.  Author: John Giovanni, MD 10/13/2022  Check [email or web address] for on-call coverage.  Nursing staff, Please call [facility] Admits & Consults System-Wide number on Amion as soon as patient's arrival, so appropriate admitting provider can evaluate the pt.

## 2022-10-13 NOTE — Consult Note (Signed)
WOC Nurse Consult Note: Reason for Consult: irritant contact dermatitis, stage 2 PI to right buttock Wound type:pressure plus moisture, irritant contact dermatitis  ICD-10 CM Codes for Irritant Dermatitis  L24A2 - Due to fecal, urinary or dual incontinence L24A9 - Due to friction or contact with other specified body fluids L30.4  - Erythema intertrigo. Also used for abrasion of the hand, chafing of the skin, dermatitis due to sweating and friction, friction dermatitis, friction eczema, and genital/thigh intertrigo.   Pressure Injury POA: Yes Measurement: 1cm x 0.6cm x 0.1cm Wound WUJ:WJXB, moist Drainage (amount, consistency, odor) scant serous Periwound: 3cm x 2.4cm area of maceration proximal to the anus. 11cm x 8cm area of blanching erythema to sacrum, labia, perineal area consistent with irritant contact dermatitis. Dressing procedure/placement/frequency: I have provided Nursing with guidance in the care of her pressure injury and surrounding irritant  contact dermatitis using three times daily and PRN cleansing of the affected areas with our house skin cleanser followed by application of Desitin ointment. Topical care to the PI will be with xeroform gauze topped with silicone foam. Heels are to be placed into Prevalon boots. Turning and repositioning to minimize time in the supine position is recommended.  WOC nursing team will not follow, but will remain available to this patient, the nursing and medical teams.  Please re-consult if needed.  Thank you for inviting [place] to participate in this patient's Plan of Care.  Ladona Mow, MSN, RN, CNS, GNP, Leda Min, [employer], [employer] phone:  [phone]

## 2022-10-13 NOTE — Progress Notes (Signed)
Pharmacy Antibiotic Note  [name] is a 77 y.o. female admitted on 10/12/2022 with UTI.  Pharmacy has been consulted for meropenem dosing.  Pt has been started on meropenem due to multiple instance of ESBL Klebsiella UTI in the past.   Plan: Meropenem 1 gr IV q12h  Monitor clinical course, renal function, cultures as available   Height: 5' 2.99" (160 cm) Weight: 55.3 kg (122 lb) IBW/kg (Calculated) : 52.38  Temp (24hrs), Avg:98 F (36.7 C), Min:97.7 F (36.5 C), Max:98.3 F (36.8 C)  Recent Labs  Lab 10/07/22 1650 10/07/22 1741 10/12/22 2159  WBC 4.9  --  4.2  CREATININE  --  0.71 0.84    Estimated Creatinine Clearance: 47.9 mL/min (by C-G formula based on SCr of 0.84 mg/dL).    Allergies  Allergen Reactions   Kiwi Extract Anaphylaxis   Gabapentin Nausea And Vomiting   Tdap [Tetanus-Diphth-Acell Pertussis] Swelling and Other (See Comments)    Swelling at injection site, gets very hot   Latex Itching, Dermatitis and Rash   Statins Other (See Comments)    RHABDOMYOLYSIS   Tramadol Nausea And Vomiting    Antimicrobials this admission: 6/6 meropenem >>     Dose adjustments this admission:   Microbiology results: 6/6 UCx:   Thank you for allowing pharmacy to be a part of this patient's care.   Adalberto Cole, PharmD, BCPS 10/13/2022 9:51 AM

## 2022-10-13 NOTE — ED Notes (Signed)
Pt. Resting with eyes closed, respirations even and unlabored

## 2022-10-13 NOTE — Plan of Care (Signed)

## 2022-10-13 NOTE — TOC CM/SW Note (Signed)
Transition of Care [facility]) - Inpatient Brief Assessment   Patient Details  Name: [name] MRN: [id number] Date of Birth: 1945/07/10  Transition of Care [facility]) CM/SW Contact:    Durenda Guthrie, RN Phone Number: 10/13/2022, 11:03 AM   Clinical Narrative:    Transition of Care Asessment: Insurance and Status: Insurance coverage has been reviewed Patient has primary care physician: Yes (Dr. Jacalyn Lefevre) Home environment has been reviewed: [place], 4 steps to enter, lives with husband Prior level of function:: wheelchair dependent d/t previous strokes (daughter states she and her brother take turns staying with patient. they are in need of aide assistance. patient's husband is in poor health) Prior/Current Home Services: No current home services Social Determinants of Health Reivew: SDOH reviewed no interventions necessary Readmission risk has been reviewed: Yes Transition of care needs: transition of care needs identified, TOC will continue to follow

## 2022-10-14 ENCOUNTER — Ambulatory Visit: Payer: Self-pay

## 2022-10-14 DIAGNOSIS — N3000 Acute cystitis without hematuria: Secondary | ICD-10-CM | POA: Diagnosis not present

## 2022-10-14 LAB — CBC
HCT: 33.6 % — ABNORMAL LOW (ref 36.0–46.0)
Hemoglobin: 11.1 g/dL — ABNORMAL LOW (ref 12.0–15.0)
MCH: 31.2 pg (ref 26.0–34.0)
MCHC: 33 g/dL (ref 30.0–36.0)
MCV: 94.4 fL (ref 80.0–100.0)
Platelets: 99 10*3/uL — ABNORMAL LOW (ref 150–400)
RBC: 3.56 MIL/uL — ABNORMAL LOW (ref 3.87–5.11)
RDW: 15 % (ref 11.5–15.5)
WBC: 3.5 10*3/uL — ABNORMAL LOW (ref 4.0–10.5)
nRBC: 0 % (ref 0.0–0.2)

## 2022-10-14 LAB — BASIC METABOLIC PANEL
Anion gap: 7 (ref 5–15)
BUN: 16 mg/dL (ref 8–23)
CO2: 27 mmol/L (ref 22–32)
Calcium: 9.1 mg/dL (ref 8.9–10.3)
Chloride: 103 mmol/L (ref 98–111)
Creatinine, Ser: 0.77 mg/dL (ref 0.44–1.00)
GFR, Estimated: >60 mL/min (ref >60)
Glucose, Bld: 303 mg/dL — ABNORMAL HIGH (ref 70–99)
Potassium: 2.8 mmol/L — ABNORMAL LOW (ref 3.5–5.1)
Sodium: 137 mmol/L (ref 135–145)

## 2022-10-14 LAB — MAGNESIUM: Magnesium: 1.8 mg/dL (ref 1.7–2.4)

## 2022-10-14 LAB — GLUCOSE, CAPILLARY
Glucose-Capillary: 263 mg/dL — ABNORMAL HIGH (ref 70–99)
Glucose-Capillary: 335 mg/dL — ABNORMAL HIGH (ref 70–99)
Glucose-Capillary: 323 mg/dL — ABNORMAL HIGH (ref 70–99)
Glucose-Capillary: 232 mg/dL — ABNORMAL HIGH (ref 70–99)

## 2022-10-14 MED ORDER — POTASSIUM CHLORIDE 10 MEQ/100ML IV SOLN
10.0000 meq | INTRAVENOUS | Status: AC
  Administered 2022-10-14 (×2): 10 meq via INTRAVENOUS
  Filled 2022-10-14: qty 100

## 2022-10-14 MED ORDER — POTASSIUM CHLORIDE 10 MEQ/100ML IV SOLN
10.0000 meq | INTRAVENOUS | Status: AC
  Administered 2022-10-14 (×2): 10 meq via INTRAVENOUS
  Filled 2022-10-14 (×3): qty 100

## 2022-10-14 MED ORDER — POTASSIUM CHLORIDE CRYS ER 20 MEQ PO TBCR
40.0000 meq | EXTENDED_RELEASE_TABLET | Freq: Two times a day (BID) | ORAL | Status: DC
  Administered 2022-10-14 – 2022-10-16 (×5): 40 meq via ORAL
  Filled 2022-10-14 (×5): qty 2

## 2022-10-14 MED ORDER — ORAL CARE MOUTH RINSE: 15.0000 mL | OROMUCOSAL | Status: DC | PRN

## 2022-10-14 MED ORDER — SODIUM CHLORIDE 0.9 % IV SOLN
1.0000 g | Freq: Three times a day (TID) | INTRAVENOUS | Status: DC
  Administered 2022-10-14: 1 g via INTRAVENOUS
  Filled 2022-10-14 (×2): qty 20

## 2022-10-14 MED ORDER — LINEZOLID 600 MG PO TABS
600.0000 mg | ORAL_TABLET | Freq: Two times a day (BID) | ORAL | Status: DC
  Administered 2022-10-14 – 2022-10-18 (×9): 600 mg via ORAL
  Filled 2022-10-14 (×9): qty 1

## 2022-10-14 NOTE — Progress Notes (Signed)
PROGRESS NOTE    [name]  BMW:[id number] DOB: November 20, 1945 DOA: 10/12/2022 PCP: Frederica Kuster, MD   Brief Narrative:    [name] is a 77 y.o. female with medical history significant for Elita Boone cirrhosis status post TIPS, chronic diastolic congestive heart failure, chronic anemia, insulin-dependent type 2 diabetes, hypertension, hyperlipidemia, anxiety, subdural hemorrhage April 2024 who has had multiple Klebsiella ESBL UTIs and is being admitted to the hospital with concern for recurrent UTI.   Assessment & Plan:   Principal Problem:   UTI (urinary tract infection)  Assessment and Plan:   UTI-with history of Klebsiella ESBL - Urine cultures pending and blood cultures with no growth thus far -Empiric IV meropenem   Insulin-dependent type 2 diabetes-presenting with hyperglycemia likely due to acute infection -Carb controlled diet -Lantus 25 units nightly (slight reduction from home dose) -NovoLog 4 units before breakfast, 6 units before lunch, 8 units before supper -Additionally, she will receive sliding scale -Appreciate diabetes coordinator evaluation   Chronic diastolic congestive heart failure-without evidence of acute exacerbation.  She has received LR at 100 cc/h, blood sugar is normalizing so we will discontinue this. -Continue home Lasix   NASH-continue Zetia, lactulose, rifaximin, Protonix  Hypokalemia -Replete and reevaluate in a.m.    DVT prophylaxis:SCDs due to prior subarachnoid hemorrhage Code Status: Full Family Communication: None at bedside Disposition Plan:  Status is: Observation The patient will require care spanning > 2 midnights and should be moved to inpatient because: Need for continued IV antibiotics.  Consultants:  None  Procedures:  None  Antimicrobials:  Anti-infectives (From admission, onward)    Start     Dose/Rate Route Frequency Ordered Stop   10/14/22 0715  meropenem (MERREM) 1 g in sodium chloride 0.9  % 100 mL IVPB        1 g 200 mL/hr over 30 Minutes Intravenous Every 8 hours 10/14/22 0713     10/13/22 1300  rifaximin (XIFAXAN) tablet 550 mg        550 mg Oral 2 times daily 10/13/22 0742     10/13/22 1000  meropenem (MERREM) 1 g in sodium chloride 0.9 % 100 mL IVPB  Status:  Discontinued        1 g 200 mL/hr over 30 Minutes Intravenous Every 12 hours 10/13/22 0813 10/14/22 0713   10/13/22 0100  meropenem (MERREM) 1 g in sodium chloride 0.9 % 100 mL IVPB        1 g 200 mL/hr over 30 Minutes Intravenous  Once 10/13/22 0048 10/13/22 0134      Subjective: Patient seen and evaluated today with no new acute complaints or concerns. No acute concerns or events noted overnight.  Objective: Vitals:   10/13/22 0617 10/13/22 1258 10/13/22 1906 10/14/22 0539  BP:  (!) 151/65 (!) 131/52 (!) 143/63  Pulse:   76 71  Resp:  16 16 16   Temp:  98.5 F (36.9 C) 98.7 F (37.1 C) 98 F (36.7 C)  TempSrc:  Oral Oral Oral  SpO2:  98% 98% 98%  Weight: 55.3 kg     Height: 5' 2.99" (1.6 m)       Intake/Output Summary (Last 24 hours) at 10/14/2022 0935 Last data filed at 10/14/2022 0328 Gross per 24 hour  Intake 470 ml  Output 600 ml  Net -130 ml   Filed Weights   10/13/22 0617  Weight: 55.3 kg    Examination:  General exam: Appears calm and comfortable  Respiratory system: Clear to auscultation.  Respiratory effort normal. Cardiovascular system: S1 & S2 heard, RRR.  Gastrointestinal system: Abdomen is soft Central nervous system: Alert and awake Extremities: No edema Skin: No significant lesions noted Psychiatry: Flat affect.    Data Reviewed: I have personally reviewed following labs and imaging studies  CBC: Recent Labs  Lab 10/07/22 1650 10/07/22 1749 10/12/22 2159 10/12/22 2256 10/14/22 0420  WBC 4.9  --  4.2  --  3.5*  HGB 12.3 12.2 12.9 12.6 11.1*  HCT 36.6 36.0 38.6 37.0 33.6*  MCV 92.2  --  91.5  --  94.4  PLT 112*  --  130*  --  99*   Basic Metabolic  Panel: Recent Labs  Lab 10/07/22 1741 10/07/22 1749 10/07/22 2101 10/12/22 2159 10/12/22 2256 10/14/22 0420  NA 134* 134*  --  135 136 137  K 2.9* 2.9*  --  3.8 3.4* 2.8*  CL 94*  --   --  97*  --  103  CO2 31  --   --  32  --  27  GLUCOSE 447*  --   --  548*  --  303*  BUN 21  --   --  17  --  16  CREATININE 0.71  --   --  0.84  --  0.77  CALCIUM 10.4*  --   --  11.0*  --  9.1  MG  --   --  1.8  --   --  1.8   GFR: Estimated Creatinine Clearance: 50.3 mL/min (by C-G formula based on SCr of 0.77 mg/dL). Liver Function Tests: No results for input(s): "AST", "ALT", "ALKPHOS", "BILITOT", "PROT", "ALBUMIN" in the last 168 hours. No results for input(s): "LIPASE", "AMYLASE" in the last 168 hours. Recent Labs  Lab 10/07/22 2101  AMMONIA 12   Coagulation Profile: No results for input(s): "INR", "PROTIME" in the last 168 hours. Cardiac Enzymes: No results for input(s): "CKTOTAL", "CKMB", "CKMBINDEX", "TROPONINI" in the last 168 hours. BNP (last 3 results) No results for input(s): "PROBNP" in the last 8760 hours. HbA1C: No results for input(s): "HGBA1C" in the last 72 hours. CBG: Recent Labs  Lab 10/13/22 0743 10/13/22 1125 10/13/22 1709 10/13/22 2112 10/14/22 0731  GLUCAP 142* 178* 283* 351* 263*   Lipid Profile: No results for input(s): "CHOL", "HDL", "LDLCALC", "TRIG", "CHOLHDL", "LDLDIRECT" in the last 72 hours. Thyroid Function Tests: No results for input(s): "TSH", "T4TOTAL", "FREET4", "T3FREE", "THYROIDAB" in the last 72 hours. Anemia Panel: No results for input(s): "VITAMINB12", "FOLATE", "FERRITIN", "TIBC", "IRON", "RETICCTPCT" in the last 72 hours. Sepsis Labs: No results for input(s): "PROCALCITON", "LATICACIDVEN" in the last 168 hours.  Recent Results (from the past 240 hour(s))  Resp panel by RT-PCR (RSV, Flu A&B, Covid) Anterior Nasal Swab     Status: None   Collection Time: 10/07/22  9:01 PM   Specimen: Anterior Nasal Swab  Result Value Ref Range  Status   SARS Coronavirus 2 by RT PCR NEGATIVE NEGATIVE Final    Comment: (NOTE) SARS-CoV-2 target nucleic acids are NOT DETECTED.  The SARS-CoV-2 RNA is generally detectable in upper respiratory specimens during the acute phase of infection. The lowest concentration of SARS-CoV-2 viral copies this assay can detect is 138 copies/mL. A negative result does not preclude SARS-Cov-2 infection and should not be used as the sole basis for treatment or other patient management decisions. A negative result may occur with  improper specimen collection/handling, submission of specimen other than nasopharyngeal swab, presence of viral mutation(s) within the areas targeted  by this assay, and inadequate number of viral copies(<138 copies/mL). A negative result must be combined with clinical observations, patient history, and epidemiological information. The expected result is Negative.  Fact Sheet for Patients:  BloggerCourse.com  Fact Sheet for Healthcare Providers:  SeriousBroker.it  This test is no t yet approved or cleared by the [place] FDA and  has been authorized for detection and/or diagnosis of SARS-CoV-2 by FDA under an Emergency Use Authorization (EUA). This EUA will remain  in effect (meaning this test can be used) for the duration of the COVID-19 declaration under Section 564(b)(1) of the Act, 21 U.S.C.section 360bbb-3(b)(1), unless the authorization is terminated  or revoked sooner.       Influenza A by PCR NEGATIVE NEGATIVE Final   Influenza B by PCR NEGATIVE NEGATIVE Final    Comment: (NOTE) The Xpert Xpress SARS-CoV-2/FLU/RSV plus assay is intended as an aid in the diagnosis of influenza from Nasopharyngeal swab specimens and should not be used as a sole basis for treatment. Nasal washings and aspirates are unacceptable for Xpert Xpress SARS-CoV-2/FLU/RSV testing.  Fact Sheet for  Patients: BloggerCourse.com  Fact Sheet for Healthcare Providers: SeriousBroker.it  This test is not yet approved or cleared by the [place] FDA and has been authorized for detection and/or diagnosis of SARS-CoV-2 by FDA under an Emergency Use Authorization (EUA). This EUA will remain in effect (meaning this test can be used) for the duration of the COVID-19 declaration under Section 564(b)(1) of the Act, 21 U.S.C. section 360bbb-3(b)(1), unless the authorization is terminated or revoked.     Resp Syncytial Virus by PCR NEGATIVE NEGATIVE Final    Comment: (NOTE) Fact Sheet for Patients: BloggerCourse.com  Fact Sheet for Healthcare Providers: SeriousBroker.it  This test is not yet approved or cleared by the [place] FDA and has been authorized for detection and/or diagnosis of SARS-CoV-2 by FDA under an Emergency Use Authorization (EUA). This EUA will remain in effect (meaning this test can be used) for the duration of the COVID-19 declaration under Section 564(b)(1) of the Act, 21 U.S.C. section 360bbb-3(b)(1), unless the authorization is terminated or revoked.  Performed at [facility], [address], [place], [place] [address]   Urine Culture (for pregnant, neutropenic or urologic patients or patients with an indwelling urinary catheter)     Status: None (Preliminary result)   Collection Time: 10/13/22  8:02 AM   Specimen: Urine, Clean Catch  Result Value Ref Range Status   Specimen Description   Final    URINE, CLEAN CATCH Performed at [facility], [address], [place], [place] [address]    Special Requests   Final    Immunocompromised Performed at [facility], [address], [place], [place] [address]    Culture   Final    CULTURE REINCUBATED FOR BETTER GROWTH Performed at [facility], [address] [place], [place] [address]    Report Status PENDING  Incomplete         Radiology Studies: No results found.      Scheduled Meds:  artificial tears   Both Eyes QHS   ezetimibe  10 mg Oral Daily   insulin aspart  0-15 Units Subcutaneous TID WC   insulin aspart  0-5 Units Subcutaneous QHS   insulin aspart  4 Units Subcutaneous QAC breakfast   insulin aspart  6 Units Subcutaneous QAC lunch   insulin aspart  8 Units Subcutaneous QAC supper   insulin glargine-yfgn  25 Units Subcutaneous QHS   lactulose  20 g Oral Daily   pantoprazole  40 mg Oral Daily   potassium chloride  40 mEq Oral BID   rifaximin  550 mg Oral BID   Continuous Infusions:  meropenem (MERREM) IV 1 g (10/14/22 0906)   potassium chloride 10 mEq (10/14/22 0845)     LOS: 0 days    Time spent: 35 minutes    Dayvin Aber Hoover Brunette, DO [facility]  If 7PM-7AM, please contact night-coverage [email or web address] 10/14/2022, 9:35 AM

## 2022-10-14 NOTE — TOC Initial Note (Signed)
Transition of Care [facility]) - Initial/Assessment Note   Patient Details  Name: [name] MRN: [id number] Date of Birth: 21-Jul-1945  [facility] CM/SW Contact:    Kayla Schlein, LCSW Phone Number: 10/14/2022, 1:32 PM  Clinical Narrative: CSW confirmed with [facility] that patient is active and is eligible for SNF under the waiver, so a 3 midnight inpatient stay will not be required.  CSW spoke with son and confirmed family would like rehab for the patient. FL2 done; PASRR confirmed. Initial referral faxed out. TOC awaiting bed offers.                Expected Discharge Plan: Skilled Nursing Facility Barriers to Discharge: Continued Medical Work up, English as a second language teacher, SNF Pending bed offer  Patient Goals and CMS Choice Patient states their goals for this hospitalization and ongoing recovery are:: Go to rehab CMS Medicare.gov Compare Post Acute Care list provided to:: Patient Represenative (must comment) [name] (son)) Choice offered to / list presented to : Adult Children  Expected Discharge Plan and Services In-house Referral: Clinical Social Work Post Acute Care Choice: Skilled Nursing Facility Living arrangements for the past 2 months: Single Family Home             DME Arranged: N/A DME Agency: NA  Prior Living Arrangements/Services Living arrangements for the past 2 months: Single Family Home Patient language and need for interpreter reviewed:: Yes Do you feel safe going back to the place where you live?: Yes      Need for Family Participation in Patient Care: Yes (Comment) (Patient has memory impairment.) Care giver support system in place?: Yes (comment) Criminal Activity/Legal Involvement Pertinent to Current Situation/Hospitalization: No - Comment as needed  Activities of Daily Living Home Assistive Devices/Equipment: Hearing aid, Shower chair without back, Walker (specify type), Wheelchair ADL Screening (condition at time of admission) Patient's  cognitive ability adequate to safely complete daily activities?: Yes Is the patient deaf or have difficulty hearing?: Yes (Hearing Aides) Does the patient have difficulty seeing, even when wearing glasses/contacts?: No Does the patient have difficulty concentrating, remembering, or making decisions?: Yes Patient able to express need for assistance with ADLs?: Yes Does the patient have difficulty dressing or bathing?: Yes Independently performs ADLs?: Yes (appropriate for developmental age) Does the patient have difficulty walking or climbing stairs?: Yes Weakness of Legs: Both Weakness of Arms/Hands: Both  Permission Sought/Granted Permission sought to share information with : Facility Industrial/product designer granted to share information with : Yes, Verbal Permission Granted Permission granted to share info w AGENCY: SNFs  Emotional Assessment Orientation: : Oriented to Self, Oriented to Place, Oriented to  Time Alcohol / Substance Use: Not Applicable Psych Involvement: No (comment)  Admission diagnosis:  UTI (urinary tract infection) [N39.0] Hyperglycemia [R73.9] ESBL (extended spectrum beta-lactamase) producing bacteria infection [A49.9, Z16.12] Patient Active Problem List   Diagnosis Date Noted   UTI (urinary tract infection) 10/13/2022   Acute metabolic encephalopathy 09/03/2022   Elevated troponin 09/03/2022   Hypocalcemia 09/03/2022   Pressure injury of skin 09/03/2022   Traumatic subdural hematoma (HCC) 08/21/2022   Prolonged QT interval 08/21/2022   Subarachnoid hemorrhage (HCC) 08/21/2022   Lumbar transverse process fracture (HCC) 08/21/2022   Fall at home 08/21/2022   Wound of left leg 08/21/2022   Pancytopenia (HCC) 08/21/2022   Mucocele of appendix 08/21/2022   Weight loss 08/21/2022   Vertigo 07/01/2022   Chronic diastolic CHF (congestive heart failure) (HCC) 07/01/2022   GAD (generalized anxiety disorder)  07/01/2022   Bilateral lower extremity edema  07/01/2022   Tenderness of left calf 07/01/2022   Left hip pain 07/01/2022   Hypoalbuminemia 07/01/2022   S/P lumbar fusion 02/25/2022   Pyohydronephrosis 05/29/2021   URI (upper respiratory infection) 05/12/2021   Lymphopenia 11/05/2020   CHF (congestive heart failure) (HCC)    Coronary artery disease    Type II diabetes mellitus (HCC)    Type 2 diabetes mellitus without complication (HCC)    COVID-19 virus infection 04/2020   A-fib (HCC) 08/19/2019   S/P TIPS (transjugular intrahepatic portosystemic shunt)    Hypercalcemia 05/26/2019   AKI (acute kidney injury) (HCC)    Hyponatremia    Acute lower UTI 05/16/2019   Acute delirium    General weakness 05/15/2019   Encephalopathy, hepatic (HCC) 05/15/2019   Volume depletion 05/15/2019   Bleeding esophageal varices (HCC)    UGI bleed    Infarction of spleen 04/06/2019   Odynophagia 04/01/2019   Dysphagia    Hepatoma (HCC) 01/31/2019   Coronary artery disease of native artery of native heart with stable angina pectoris (HCC) 01/31/2019   Thrombocytopenia (HCC) 09/25/2017   Left forearm pain 05/29/2017   Portal vein thrombosis 04/22/2017   Skin cancer of nose 08/23/2016   History of colonic polyps    Benign neoplasm of rectum    Esophageal varices without bleeding (HCC)    Portal hypertensive gastropathy (HCC)    Leg cramps 05/18/2016   Routine lab draw 05/13/2016   Iron deficiency anemia 05/09/2016   Statin-induced rhabdomyolysis 05/07/2016   Liver cirrhosis secondary to NASH (HCC) 04/05/2016   PVC's (premature ventricular contractions) 04/05/2016   CAD (coronary artery disease) 02/16/2016   Hypokalemia 02/16/2016   S/P CABG x 3 02/02/2016   History of non-ST elevation myocardial infarction (NSTEMI) 01/27/2016   DM2 (diabetes mellitus, type 2) (HCC) 12/15/2015   Trochanteric bursitis of left hip 12/15/2015   S/P lumbar spinal fusion 06/10/2015   Paresthesia 04/01/2015   Osteoporosis 03/31/2015   Hearing loss  06/25/2014   Insomnia 06/25/2014   Hair thinning 06/25/2014   Muscle spasm 10/08/2013   Biceps tendon rupture, proximal 11/24/2012   Hyperlipidemia    GERD (gastroesophageal reflux disease)    Essential hypertension    PCP:  Frederica Kuster, MD Pharmacy:   [id number] [place], [place] - [address] AT [facility] OF [facility] & [address] [place] [address] Phone: [phone] Fax: [phone]  Social Determinants of Health (SDOH) Social History: SDOH Screenings   Food Insecurity: No Food Insecurity (10/13/2022)  Housing: Low Risk  (10/13/2022)  Transportation Needs: No Transportation Needs (10/13/2022)  Utilities: Not At Risk (10/13/2022)  Alcohol Screen: Low Risk  (06/04/2018)  Depression (PHQ2-9): Low Risk  (08/15/2022)  Financial Resource Strain: Low Risk  (05/29/2017)  Physical Activity: Inactive (05/29/2017)  Social Connections: Moderately Integrated (05/29/2017)  Stress: No Stress Concern Present (05/29/2017)  Tobacco Use: Low Risk  (10/12/2022)   SDOH Interventions:    Readmission Risk Interventions    07/03/2022   12:13 PM  Readmission Risk Prevention Plan  Transportation Screening Complete  Medication Review (RN Care Manager) Not Complete  PCP or Specialist appointment within 3-5 days of discharge Complete  SW Recovery Care/Counseling Consult Complete  Palliative Care Screening Not Complete  Comments patient will go to skilled nursing  Skilled Nursing Facility Complete

## 2022-10-14 NOTE — Patient Outreach (Signed)
  Care Coordination    10/14/2022 Name: Mayia Megill MRN: [id number] DOB: 1945-12-19  Levora Dredge Flanery is a 77 y.o. year old female who sees Frederica Kuster, MD for primary care. I  collaborated with Chartered certified accountant Little to discuss patients case and interventions related to care coordination.   SDOH assessments and interventions completed:  No     Care Coordination Interventions:  Yes, provided   Interventions Today    Flowsheet Row Most Recent Value  Chronic Disease   Chronic disease during today's visit Other  General Interventions   General Interventions Discussed/Reviewed Communication with  [Discussed patient is currently inpatient,  reviewed past interventions and possible new interventions to assist with patients care coordination needs]  Communication with RN       Follow up plan:  SW will follow up with the patients son over the next week.    Encounter Outcome:  Pt. Visit Completed    [name], BSW, CDP Social Worker, Certified Dementia Practitioner [facility] Care Management  Care Coordination [phone]

## 2022-10-14 NOTE — Progress Notes (Signed)
PHARMACY NOTE:  ANTIMICROBIAL RENAL DOSAGE ADJUSTMENT  Current antimicrobial regimen includes a mismatch between antimicrobial dosage and estimated renal function.  As per policy approved by the Pharmacy & Therapeutics and Medical Executive Committees, the antimicrobial dosage will be adjusted accordingly.  Current antimicrobial dosage:  meropenem 1 gr IV q12h   Indication: UTI   Renal Function:  Estimated Creatinine Clearance: 50.3 mL/min (by C-G formula based on SCr of 0.77 mg/dL). []      On intermittent HD, scheduled: []      On CRRT    Antimicrobial dosage has been changed to:   - meropenem 1 gr IV q8h   Additional comments:   Thank you for allowing pharmacy to be a part of this patient's care.   Adalberto Cole, PharmD, BCPS 10/14/2022 7:12 AM

## 2022-10-14 NOTE — Inpatient Diabetes Management (Signed)
Inpatient Diabetes Program Recommendations  AACE/ADA: New Consensus Statement on Inpatient Glycemic Control (2015)  Target Ranges:  Prepandial:   less than 140 mg/dL      Peak postprandial:   less than 180 mg/dL (1-2 hours)      Critically ill patients:  140 - 180 mg/dL   Lab Results  Component Value Date   GLUCAP 335 (H) 10/14/2022   HGBA1C 8.8 (H) 08/03/2022    Review of Glycemic Control  Diabetes history: DM2 Outpatient Diabetes medications: Basaglar 30 QD, Novolog 4-6-8 units TID Current orders for Inpatient glycemic control: Semglee 25 QHS, Novolog 0-15 TID with meals and 0-5 HS + 4-6-8 units TID  HgbA1C - 8.8%  Inpatient Diabetes Program Recommendations:    Increase Semglee to 30 units QHS  Spoke with pt at bedside regarding her diabetes and HgbA1C of 8.8%. States she always takes her Basaglar30 units QD, but occasionally misses her Novolog at meals.  Discussed importance of taking meds as prescribed and f/u with PCP. Pt states she monitors blood sugars approx 2-3x/day. No hypos.   Will follow glucose trends.  Thank you. Ailene Ards, RD, LDN, CDCES Inpatient Diabetes Coordinator [phone]

## 2022-10-14 NOTE — Patient Outreach (Signed)
  Care Coordination   Follow Up Visit Note   10/14/2022 Name: [name] MRN: [id number] DOB: June 06, 1945  [name] is a 77 y.o. year old female who sees Frederica Kuster, MD for primary care. I spoke with  Eduard Clos by phone today.  What matters to the patients health and wellness today?  Patient's son [name] continues to have level of care concerns.     Goals Addressed             This Visit's Progress    To prevent reoccurring UTI's       Care Coordination Interventions: Completed inbound call with son [name] Determined patient is inpatient and son [name] is concerned about level of care post discharge Discussed and reviewed current diagnosis and treatment plan per inpatient team  Answered questions regarding [name]'s concerns, educated about POA and legal guardianship  Instructed [name] to contact the hospital SW in order to report his level of care concerns and discuss care options following patient's discharge, [name] verbalizes understanding and is agreeable  Collaborated with [name] BSW to advise of patient inpatient status and call/concerns from son [name]      To stay healthy and out of the hospital       Care Coordination Interventions: Reviewed chart in preparation to contact patient Noted patient is currently inpatient at [facility] admitted on 10/12/22 with dx: UTI, Hyperglycemia     Interventions Today    Flowsheet Row Most Recent Value  Chronic Disease   Chronic disease during today's visit Other  [recurrent UTI,  Hyperglycemia]  General Interventions   General Interventions Discussed/Reviewed General Interventions Discussed, General Interventions Reviewed, Doctor Visits, Labs  Doctor Visits Discussed/Reviewed Doctor Visits Discussed, Doctor Visits Reviewed, Specialist  Education Interventions   Education Provided Provided Education          SDOH assessments and interventions completed:  No     Care Coordination  Interventions:  Yes, provided   Follow up plan: Follow up call scheduled for 10/21/22 @09 :00 AM    Encounter Outcome:  Pt. Visit Completed

## 2022-10-14 NOTE — Patient Instructions (Signed)
Visit Information  Thank you for taking time to visit with me today. Please don't hesitate to contact me if I can be of assistance to you.   Following are the goals we discussed today:   Goals Addressed             This Visit's Progress    To prevent reoccurring UTI's       Care Coordination Interventions: Completed inbound call with son [name] Determined patient is inpatient and son [name] is concerned about level of care post discharge Discussed and reviewed current diagnosis and treatment plan per inpatient team  Answered questions regarding [name]'s concerns, educated about POA and legal guardianship  Instructed [name] to contact the hospital SW in order to report his level of care concerns and discuss care options following patient's discharge, [name] verbalizes understanding and is agreeable  Collaborated with [name] BSW to advise of patient inpatient status and call/concerns from son [name]      To stay healthy and out of the hospital       Care Coordination Interventions: Reviewed chart in preparation to contact patient Noted patient is currently inpatient at [facility] admitted on 10/12/22 with dx: UTI, Hyperglycemia         Our next appointment is by telephone on 10/21/22 at 09:00 AM   Please call the care guide team at [phone] if you need to cancel or reschedule your appointment.   If you are experiencing a Mental Health or Behavioral Health Crisis or need someone to talk to, please call 1-800-273-TALK (toll free, 24 hour hotline)  Patient verbalizes understanding of instructions and care plan provided today and agrees to view in MyChart. Active MyChart status and patient understanding of how to access instructions and care plan via MyChart confirmed with patient.     Delsa Sale, RN, BSN, CCM Care Management Coordinator [facility] Care Management  Direct Phone: [phone]

## 2022-10-14 NOTE — Evaluation (Signed)
Physical Therapy Evaluation Patient Details Name: [name] MRN: [id number] DOB: 1945/06/02 Today's Date: 10/14/2022  History of Present Illness  Pt is 77 yo female admitted 10/12/22 with UTI.  Pt with hx of NASH cirrhosis s/p TIPS, CHF, anemia, DM2, HTN, HLD, anxiety, subdural hemorrhage 4/24, multiple Klebsiella ESBL.  Family reports recent falls with R rib fxs.   Clinical Impression  Pt admitted with above diagnosis. At baseline, pt is household ambulatory with RW.  She has had declining mobility over the past few months with frequent falls (particularly when she gets UTI).  Today, pt requiring increased cues for initiation and sequencing with mod A for mobility.  She ambulated 12'x2 with min A and slow speed.  Pt is below her baseline and is a fall risk.  Pt currently with functional limitations due to the deficits listed below (see PT Problem List). Pt will benefit from acute skilled PT to increase their independence and safety with mobility to allow discharge.  At discharge Patient will benefit from continued inpatient follow up therapy, <3 hours/day         Recommendations for follow up therapy are one component of a multi-disciplinary discharge planning process, led by the attending physician.  Recommendations may be updated based on patient status, additional functional criteria and insurance authorization.  Follow Up Recommendations Can patient physically be transported by private vehicle: Yes     Assistance Recommended at Discharge Frequent or constant Supervision/Assistance  Patient can return home with the following  A lot of help with walking and/or transfers;A lot of help with bathing/dressing/bathroom;Assistance with cooking/housework;Help with stairs or ramp for entrance    Equipment Recommendations None recommended by PT  Recommendations for Other Services       Functional Status Assessment Patient has had a recent decline in their functional status and  demonstrates the ability to make significant improvements in function in a reasonable and predictable amount of time.     Precautions / Restrictions Precautions Precautions: Fall      Mobility  Bed Mobility Overal bed mobility: Needs Assistance Bed Mobility: Sit to Supine, Rolling Rolling: Mod assist     Sit to supine: Mod assist   General bed mobility comments: Increased time with assist for legs and repositioning in bed.  Cues for sequencing.    Transfers Overall transfer level: Needs assistance Equipment used: Rolling walker (2 wheels) Transfers: Sit to/from Stand Sit to Stand: Mod assist           General transfer comment: Sit to stand x 2 from chair and BSC with cues for hand placement and mod A to rise.  When sitting on toileting - pt had difficulty initiating sitting motion requiring assist for hand placement, to initiate hip flexion, and to slowly lower    Ambulation/Gait Ambulation/Gait assistance: Min assist Gait Distance (Feet): 12 Feet (12'x2) Assistive device: Rolling walker (2 wheels) Gait Pattern/deviations: Step-to pattern, Decreased stride length, Shuffle Gait velocity: decreased     General Gait Details: Very slow gait pattern with assist for RW and forward progression  Stairs            Wheelchair Mobility    Modified Rankin (Stroke Patients Only)       Balance Overall balance assessment: Needs assistance, History of Falls Sitting-balance support: No upper extremity supported Sitting balance-Leahy Scale: Fair     Standing balance support: Bilateral upper extremity supported Standing balance-Leahy Scale: Poor  Pertinent Vitals/Pain Pain Assessment Pain Assessment: Faces Faces Pain Scale: Hurts a little bit Pain Location: bottom Pain Descriptors / Indicators: Sore Pain Intervention(s): Limited activity within patient's tolerance, Monitored during session, Repositioned    Home Living  Family/patient expects to be discharged to:: Private residence Living Arrangements: Spouse/significant other Available Help at Discharge: Family;Available 24 hours/day Type of Home: House Home Access: Stairs to enter   [employer] of Steps: 3   Home Layout: One level Home Equipment: Grab bars - tub/shower;Rolling Walker (2 wheels);Toilet riser;Cane - single point;Rollator (4 wheels);Shower seat - built in;Hospital bed;BSC/3in1;Wheelchair - manual Additional Comments: lives with husband and 5 children nearby    Prior Function               Mobility Comments: Pt has had 6 falls in past 2 months.  Family reports she gets frequent UTI, confused, dizzy, and falls.  Pt always uses RW to ambulate.  Family reports she has had declining mobility over the past year (worse with UTIs) and mainly just ambulates in home.  When leaving family helps down stairs in w/c to car ADLs Comments: Pt has assist min A with adls     Hand Dominance   Dominant Hand: Right    Extremity/Trunk Assessment   Upper Extremity Assessment Upper Extremity Assessment: Generalized weakness    Lower Extremity Assessment Lower Extremity Assessment: Generalized weakness    Cervical / Trunk Assessment Cervical / Trunk Assessment: Other exceptions Cervical / Trunk Exceptions: Very stiff/straight trunk with all transfers  Communication   Communication: HOH  Cognition Arousal/Alertness: Awake/alert Behavior During Therapy: WFL for tasks assessed/performed Overall Cognitive Status: Impaired/Different from baseline Area of Impairment: Orientation, Problem solving                 Orientation Level: Disoriented to, Time, Situation           Problem Solving: Slow processing, Decreased initiation, Difficulty sequencing, Requires tactile cues, Requires verbal cues General Comments: Pt slow to respond and in 3-4 word sentences.  Son present and reports pt normally able to carry on conversation.         General Comments General comments (skin integrity, edema, etc.): Family present and reports interested in post acute rehab - notified [facility] team    Exercises     Assessment/Plan    PT Assessment Patient needs continued PT services  PT Problem List Decreased strength;Decreased coordination;Pain;Decreased range of motion;Decreased cognition;Decreased activity tolerance;Decreased knowledge of use of DME;Decreased safety awareness;Decreased balance;Decreased mobility;Decreased knowledge of precautions       PT Treatment Interventions DME instruction;Therapeutic exercise;Gait training;Functional mobility training;Therapeutic activities;Patient/family education;Balance training;Modalities;Cognitive remediation    PT Goals (Current goals can be found in the Care Plan section)  Acute Rehab PT Goals Patient Stated Goal: Son reports interested in post acute rehab PT Goal Formulation: With patient/family Time For Goal Achievement: 10/28/22 Potential to Achieve Goals: Good    Frequency Min 1X/week     Co-evaluation               AM-PAC PT "6 Clicks" Mobility  Outcome Measure Help needed turning from your back to your side while in a flat bed without using bedrails?: A Lot Help needed moving from lying on your back to sitting on the side of a flat bed without using bedrails?: A Lot Help needed moving to and from a bed to a chair (including a wheelchair)?: A Lot Help needed standing up from a chair using your arms (e.g., wheelchair or bedside chair)?:  A Lot Help needed to walk in hospital room?: Total (limited distance and speed) Help needed climbing 3-5 steps with a railing? : Total 6 Click Score: 10    End of Session Equipment Utilized During Treatment: Gait belt Activity Tolerance: Patient tolerated treatment well Patient left: in bed;with call bell/phone within reach;with bed alarm set;with family/visitor present Nurse Communication: Mobility status PT Visit Diagnosis:  Other abnormalities of gait and mobility (R26.89);Muscle weakness (generalized) (M62.81);Repeated falls (R29.6)    Time: 7829-5621 PT Time Calculation (min) (ACUTE ONLY): 34 min   Charges:   PT Evaluation $PT Eval Low Complexity: 1 Low PT Treatments $Gait Training: 8-22 mins        Anise Salvo, PT Acute Rehab [facility] [phone]   [name] 10/14/2022, 12:54 PM

## 2022-10-14 NOTE — Patient Outreach (Signed)
  Care Coordination   Follow Up Visit Note   10/14/2022 Name: Sabrina Keough MRN: [id number] DOB: 09/04/1945  Levora Dredge Ault is a 77 y.o. year old female who sees Frederica Kuster, MD for primary care. I reviewed patient's chart today in preparation to contact patient for a nurse care coordination follow up.   What matters to the patients health and wellness today?  N/a    Goals Addressed             This Visit's Progress    To stay healthy and out of the hospital       Care Coordination Interventions: Reviewed chart in preparation to contact patient Noted patient is currently inpatient at [facility] admitted on 10/12/22 with dx: UTI, Hyperglycemia         SDOH assessments and interventions completed:  No     Care Coordination Interventions:  No, not indicated   Follow up plan: Follow up call scheduled for upon patient's discharge home     Encounter Outcome:  Pt. Visit Completed

## 2022-10-14 NOTE — [place] FL2 (Signed)
[place] MEDICAID FL2 LEVEL OF CARE FORM     IDENTIFICATION  Patient Name: [name] Birthdate: May 09, 1946 Sex: female Admission Date (Current Location): 10/12/2022  [facility] and [place] Number:  Producer, television/film/video and Address:  [facility],  [address], [place] [address]      Provider Number: [id number]  Attending Physician Name and Address:  Erick Blinks, DO  Relative Name and Phone Number:  Adrijana Cleere (son) Ph: [phone]    Current Level of Care: Hospital Recommended Level of Care: Skilled Nursing Facility Prior Approval Number:    Date Approved/Denied:   PASRR Number: [id number]  Discharge Plan: SNF    Current Diagnoses: Patient Active Problem List   Diagnosis Date Noted   UTI (urinary tract infection) 10/13/2022   Acute metabolic encephalopathy 09/03/2022   Elevated troponin 09/03/2022   Hypocalcemia 09/03/2022   Pressure injury of skin 09/03/2022   Traumatic subdural hematoma (HCC) 08/21/2022   Prolonged QT interval 08/21/2022   Subarachnoid hemorrhage (HCC) 08/21/2022   Lumbar transverse process fracture (HCC) 08/21/2022   Fall at home 08/21/2022   Wound of left leg 08/21/2022   Pancytopenia (HCC) 08/21/2022   Mucocele of appendix 08/21/2022   Weight loss 08/21/2022   Vertigo 07/01/2022   Chronic diastolic CHF (congestive heart failure) (HCC) 07/01/2022   GAD (generalized anxiety disorder) 07/01/2022   Bilateral lower extremity edema 07/01/2022   Tenderness of left calf 07/01/2022   Left hip pain 07/01/2022   Hypoalbuminemia 07/01/2022   S/P lumbar fusion 02/25/2022   Pyohydronephrosis 05/29/2021   URI (upper respiratory infection) 05/12/2021   Lymphopenia 11/05/2020   CHF (congestive heart failure) (HCC)    Coronary artery disease    Type II diabetes mellitus (HCC)    Type 2 diabetes mellitus without complication (HCC)    COVID-19 virus infection 04/2020   A-fib (HCC) 08/19/2019   S/P TIPS (transjugular  intrahepatic portosystemic shunt)    Hypercalcemia 05/26/2019   AKI (acute kidney injury) (HCC)    Hyponatremia    Acute lower UTI 05/16/2019   Acute delirium    General weakness 05/15/2019   Encephalopathy, hepatic (HCC) 05/15/2019   Volume depletion 05/15/2019   Bleeding esophageal varices (HCC)    UGI bleed    Infarction of spleen 04/06/2019   Odynophagia 04/01/2019   Dysphagia    Hepatoma (HCC) 01/31/2019   Coronary artery disease of native artery of native heart with stable angina pectoris (HCC) 01/31/2019   Thrombocytopenia (HCC) 09/25/2017   Left forearm pain 05/29/2017   Portal vein thrombosis 04/22/2017   Skin cancer of nose 08/23/2016   History of colonic polyps    Benign neoplasm of rectum    Esophageal varices without bleeding (HCC)    Portal hypertensive gastropathy (HCC)    Leg cramps 05/18/2016   Routine lab draw 05/13/2016   Iron deficiency anemia 05/09/2016   Statin-induced rhabdomyolysis 05/07/2016   Liver cirrhosis secondary to NASH (HCC) 04/05/2016   PVC's (premature ventricular contractions) 04/05/2016   CAD (coronary artery disease) 02/16/2016   Hypokalemia 02/16/2016   S/P CABG x 3 02/02/2016   History of non-ST elevation myocardial infarction (NSTEMI) 01/27/2016   DM2 (diabetes mellitus, type 2) (HCC) 12/15/2015   Trochanteric bursitis of left hip 12/15/2015   S/P lumbar spinal fusion 06/10/2015   Paresthesia 04/01/2015   Osteoporosis 03/31/2015   Hearing loss 06/25/2014   Insomnia 06/25/2014   Hair thinning 06/25/2014   Muscle spasm 10/08/2013   Biceps tendon rupture, proximal 11/24/2012  Hyperlipidemia    GERD (gastroesophageal reflux disease)    Essential hypertension     Orientation RESPIRATION BLADDER Height & Weight     Self, Time, Place  Normal Incontinent Weight: 122 lb (55.3 kg) Height:  5' 2.99" (160 cm)  BEHAVIORAL SYMPTOMS/MOOD NEUROLOGICAL BOWEL NUTRITION STATUS      Continent Diet (Carb modified)  AMBULATORY STATUS  COMMUNICATION OF NEEDS Skin   Limited Assist Verbally Skin abrasions, Other (Comment) (Abrasions: bilateral legs; Ecchymosis: right breast, left buttock; Erythema: coccyx, perineum)                       Personal Care Assistance Level of Assistance  Bathing, Feeding, Dressing Bathing Assistance: Limited assistance Feeding assistance: Independent Dressing Assistance: Limited assistance     Functional Limitations Info  Sight, Hearing, Speech Sight Info: Adequate Hearing Info: Impaired Speech Info: Adequate    SPECIAL CARE FACTORS FREQUENCY  PT (By licensed PT), OT (By licensed OT)     PT Frequency: 5x's/week OT Frequency: 5x's/week            Contractures Contractures Info: Not present    Additional Factors Info  Code Status, Allergies, Psychotropic, Insulin Sliding Scale Code Status Info: Full Allergies Info: Kiwi Extract, Gabapentin, Tdap (Tetanus-diphth-acell Pertussis), Latex, Statins, Tramadol Psychotropic Info: Desyrel Insulin Sliding Scale Info: See discharge summary       Current Medications (10/14/2022):  This is the current hospital active medication list Current Facility-Administered Medications  Medication Dose Route Frequency Provider Last Rate Last Admin   acetaminophen (TYLENOL) tablet 650 mg  650 mg Oral Q6H PRN Kirby Crigler, Mir M, MD   650 mg at 10/14/22 4098   Or   acetaminophen (TYLENOL) suppository 650 mg  650 mg Rectal Q6H PRN Kirby Crigler, Mir M, MD       albuterol (PROVENTIL) (2.5 MG/3ML) 0.083% nebulizer solution 2.5 mg  2.5 mg Nebulization Q2H PRN Kirby Crigler, Mir M, MD       artificial tears (LACRILUBE) ophthalmic ointment   Both Eyes QHS Kirby Crigler, Mir M, MD   Given at 10/13/22 2130   ezetimibe (ZETIA) tablet 10 mg  10 mg Oral Daily Kirby Crigler, Mir M, MD   10 mg at 10/14/22 0848   insulin aspart (novoLOG) injection 0-15 Units  0-15 Units Subcutaneous TID WC Kirby Crigler Mir M, MD   11 Units at 10/14/22 1243   insulin aspart (novoLOG) injection  0-5 Units  0-5 Units Subcutaneous QHS Kirby Crigler, Mir M, MD   5 Units at 10/13/22 2129   insulin aspart (novoLOG) injection 4 Units  4 Units Subcutaneous QAC breakfast Kirby Crigler, Mir M, MD       insulin aspart (novoLOG) injection 6 Units  6 Units Subcutaneous QAC lunch Kirby Crigler, Mir M, MD   6 Units at 10/14/22 1244   insulin aspart (novoLOG) injection 8 Units  8 Units Subcutaneous QAC supper Maryln Gottron, MD   8 Units at 10/13/22 1800   insulin glargine-yfgn (SEMGLEE) injection 25 Units  25 Units Subcutaneous QHS Kirby Crigler, Mir M, MD   25 Units at 10/13/22 2129   lactulose (CHRONULAC) 10 GM/15ML solution 20 g  20 g Oral Daily Kirby Crigler, Mir M, MD   20 g at 10/14/22 0847   linezolid (ZYVOX) tablet 600 mg  600 mg Oral Q12H Shah, Pratik D, DO   600 mg at 10/14/22 1241   methocarbamol (ROBAXIN) tablet 500 mg  500 mg Oral Q8H PRN Maryln Gottron, MD   500 mg at 10/14/22 [phone]  ondansetron (ZOFRAN) tablet 4 mg  4 mg Oral Q6H PRN Kirby Crigler, Mir M, MD       Or   ondansetron [facility]) injection 4 mg  4 mg Intravenous Q6H PRN Kirby Crigler, Mir M, MD       Oral care mouth rinse  15 mL Mouth Rinse PRN Kirby Crigler, Mir M, MD       pantoprazole (PROTONIX) EC tablet 40 mg  40 mg Oral Daily Kirby Crigler, Mir M, MD   40 mg at 10/14/22 0848   polyethylene glycol (MIRALAX / GLYCOLAX) packet 17 g  17 g Oral Daily PRN Kirby Crigler, Mir M, MD       potassium chloride 10 mEq in 100 mL IVPB  10 mEq Intravenous Q1 Hr x 2 Maurilio Lovely D, DO 100 mL/hr at 10/14/22 1316 10 mEq at 10/14/22 1316   potassium chloride SA (KLOR-CON M) CR tablet 40 mEq  40 mEq Oral BID Maurilio Lovely D, DO   40 mEq at 10/14/22 0848   rifaximin (XIFAXAN) tablet 550 mg  550 mg Oral BID Maryln Gottron, MD   550 mg at 10/14/22 0848   traZODone (DESYREL) tablet 25 mg  25 mg Oral QHS PRN Maryln Gottron, MD   25 mg at 10/13/22 2215     Discharge Medications: Please see discharge summary for a list of discharge medications.  Relevant Imaging  Results:  Relevant Lab Results:   Additional Information SSN: [id number]  Ewing Schlein, LCSW

## 2022-10-15 DIAGNOSIS — A499 Bacterial infection, unspecified: Secondary | ICD-10-CM | POA: Diagnosis not present

## 2022-10-15 DIAGNOSIS — E559 Vitamin D deficiency, unspecified: Secondary | ICD-10-CM | POA: Diagnosis not present

## 2022-10-15 DIAGNOSIS — E785 Hyperlipidemia, unspecified: Secondary | ICD-10-CM | POA: Diagnosis present

## 2022-10-15 DIAGNOSIS — E118 Type 2 diabetes mellitus with unspecified complications: Secondary | ICD-10-CM | POA: Diagnosis not present

## 2022-10-15 DIAGNOSIS — L89152 Pressure ulcer of sacral region, stage 2: Secondary | ICD-10-CM | POA: Diagnosis present

## 2022-10-15 DIAGNOSIS — I5032 Chronic diastolic (congestive) heart failure: Secondary | ICD-10-CM | POA: Diagnosis present

## 2022-10-15 DIAGNOSIS — E1122 Type 2 diabetes mellitus with diabetic chronic kidney disease: Secondary | ICD-10-CM | POA: Diagnosis present

## 2022-10-15 DIAGNOSIS — D649 Anemia, unspecified: Secondary | ICD-10-CM | POA: Diagnosis not present

## 2022-10-15 DIAGNOSIS — Z9102 Food additives allergy status: Secondary | ICD-10-CM | POA: Diagnosis not present

## 2022-10-15 DIAGNOSIS — R410 Disorientation, unspecified: Secondary | ICD-10-CM | POA: Diagnosis not present

## 2022-10-15 DIAGNOSIS — Z8616 Personal history of COVID-19: Secondary | ICD-10-CM | POA: Diagnosis not present

## 2022-10-15 DIAGNOSIS — I130 Hypertensive heart and chronic kidney disease with heart failure and stage 1 through stage 4 chronic kidney disease, or unspecified chronic kidney disease: Secondary | ICD-10-CM | POA: Diagnosis present

## 2022-10-15 DIAGNOSIS — Z87440 Personal history of urinary (tract) infections: Secondary | ICD-10-CM | POA: Diagnosis not present

## 2022-10-15 DIAGNOSIS — I739 Peripheral vascular disease, unspecified: Secondary | ICD-10-CM | POA: Diagnosis not present

## 2022-10-15 DIAGNOSIS — E43 Unspecified severe protein-calorie malnutrition: Secondary | ICD-10-CM | POA: Diagnosis not present

## 2022-10-15 DIAGNOSIS — E876 Hypokalemia: Secondary | ICD-10-CM | POA: Diagnosis present

## 2022-10-15 DIAGNOSIS — N390 Urinary tract infection, site not specified: Secondary | ICD-10-CM | POA: Diagnosis present

## 2022-10-15 DIAGNOSIS — N181 Chronic kidney disease, stage 1: Secondary | ICD-10-CM | POA: Diagnosis present

## 2022-10-15 DIAGNOSIS — K7581 Nonalcoholic steatohepatitis (NASH): Secondary | ICD-10-CM | POA: Diagnosis present

## 2022-10-15 DIAGNOSIS — Z9181 History of falling: Secondary | ICD-10-CM | POA: Diagnosis not present

## 2022-10-15 DIAGNOSIS — R52 Pain, unspecified: Secondary | ICD-10-CM | POA: Diagnosis not present

## 2022-10-15 DIAGNOSIS — I480 Paroxysmal atrial fibrillation: Secondary | ICD-10-CM | POA: Diagnosis present

## 2022-10-15 DIAGNOSIS — I2510 Atherosclerotic heart disease of native coronary artery without angina pectoris: Secondary | ICD-10-CM | POA: Diagnosis present

## 2022-10-15 DIAGNOSIS — F419 Anxiety disorder, unspecified: Secondary | ICD-10-CM | POA: Diagnosis not present

## 2022-10-15 DIAGNOSIS — N3000 Acute cystitis without hematuria: Secondary | ICD-10-CM | POA: Diagnosis not present

## 2022-10-15 DIAGNOSIS — G9341 Metabolic encephalopathy: Secondary | ICD-10-CM | POA: Diagnosis not present

## 2022-10-15 DIAGNOSIS — K219 Gastro-esophageal reflux disease without esophagitis: Secondary | ICD-10-CM | POA: Diagnosis present

## 2022-10-15 DIAGNOSIS — S065X0D Traumatic subdural hemorrhage without loss of consciousness, subsequent encounter: Secondary | ICD-10-CM | POA: Diagnosis not present

## 2022-10-15 DIAGNOSIS — Z981 Arthrodesis status: Secondary | ICD-10-CM | POA: Diagnosis not present

## 2022-10-15 DIAGNOSIS — Z886 Allergy status to analgesic agent status: Secondary | ICD-10-CM | POA: Diagnosis not present

## 2022-10-15 DIAGNOSIS — Z91040 Latex allergy status: Secondary | ICD-10-CM | POA: Diagnosis not present

## 2022-10-15 DIAGNOSIS — H04123 Dry eye syndrome of bilateral lacrimal glands: Secondary | ICD-10-CM | POA: Diagnosis not present

## 2022-10-15 DIAGNOSIS — B957 Other staphylococcus as the cause of diseases classified elsewhere: Secondary | ICD-10-CM | POA: Diagnosis present

## 2022-10-15 DIAGNOSIS — Z1612 Extended spectrum beta lactamase (ESBL) resistance: Secondary | ICD-10-CM | POA: Diagnosis not present

## 2022-10-15 DIAGNOSIS — M545 Low back pain, unspecified: Secondary | ICD-10-CM | POA: Diagnosis not present

## 2022-10-15 DIAGNOSIS — I252 Old myocardial infarction: Secondary | ICD-10-CM | POA: Diagnosis not present

## 2022-10-15 DIAGNOSIS — M62838 Other muscle spasm: Secondary | ICD-10-CM | POA: Diagnosis not present

## 2022-10-15 DIAGNOSIS — K7460 Unspecified cirrhosis of liver: Secondary | ICD-10-CM | POA: Diagnosis present

## 2022-10-15 DIAGNOSIS — E1142 Type 2 diabetes mellitus with diabetic polyneuropathy: Secondary | ICD-10-CM | POA: Diagnosis present

## 2022-10-15 DIAGNOSIS — Z7401 Bed confinement status: Secondary | ICD-10-CM | POA: Diagnosis not present

## 2022-10-15 DIAGNOSIS — M542 Cervicalgia: Secondary | ICD-10-CM | POA: Diagnosis not present

## 2022-10-15 DIAGNOSIS — E538 Deficiency of other specified B group vitamins: Secondary | ICD-10-CM | POA: Diagnosis not present

## 2022-10-15 DIAGNOSIS — R062 Wheezing: Secondary | ICD-10-CM | POA: Diagnosis not present

## 2022-10-15 DIAGNOSIS — M1990 Unspecified osteoarthritis, unspecified site: Secondary | ICD-10-CM | POA: Diagnosis not present

## 2022-10-15 DIAGNOSIS — R739 Hyperglycemia, unspecified: Secondary | ICD-10-CM | POA: Diagnosis not present

## 2022-10-15 DIAGNOSIS — I10 Essential (primary) hypertension: Secondary | ICD-10-CM | POA: Diagnosis not present

## 2022-10-15 DIAGNOSIS — Z888 Allergy status to other drugs, medicaments and biological substances status: Secondary | ICD-10-CM | POA: Diagnosis not present

## 2022-10-15 DIAGNOSIS — B952 Enterococcus as the cause of diseases classified elsewhere: Secondary | ICD-10-CM | POA: Diagnosis present

## 2022-10-15 DIAGNOSIS — E1165 Type 2 diabetes mellitus with hyperglycemia: Secondary | ICD-10-CM | POA: Diagnosis present

## 2022-10-15 DIAGNOSIS — G8929 Other chronic pain: Secondary | ICD-10-CM | POA: Diagnosis not present

## 2022-10-15 LAB — BASIC METABOLIC PANEL
Anion gap: 7 (ref 5–15)
BUN: 16 mg/dL (ref 8–23)
CO2: 24 mmol/L (ref 22–32)
Calcium: 9.4 mg/dL (ref 8.9–10.3)
Chloride: 102 mmol/L (ref 98–111)
Creatinine, Ser: 0.65 mg/dL (ref 0.44–1.00)
GFR, Estimated: >60 mL/min (ref >60)
Glucose, Bld: 216 mg/dL — ABNORMAL HIGH (ref 70–99)
Potassium: 4.1 mmol/L (ref 3.5–5.1)
Sodium: 133 mmol/L — ABNORMAL LOW (ref 135–145)

## 2022-10-15 LAB — MAGNESIUM: Magnesium: 1.8 mg/dL (ref 1.7–2.4)

## 2022-10-15 LAB — GLUCOSE, CAPILLARY
Glucose-Capillary: 184 mg/dL — ABNORMAL HIGH (ref 70–99)
Glucose-Capillary: 311 mg/dL — ABNORMAL HIGH (ref 70–99)
Glucose-Capillary: 257 mg/dL — ABNORMAL HIGH (ref 70–99)
Glucose-Capillary: 175 mg/dL — ABNORMAL HIGH (ref 70–99)

## 2022-10-15 LAB — CULTURE, BLOOD (ROUTINE X 2): Special Requests: ADEQUATE

## 2022-10-15 LAB — URINE CULTURE: Culture: 40000 — AB

## 2022-10-15 LAB — CBC
HCT: 35.3 % — ABNORMAL LOW (ref 36.0–46.0)
Hemoglobin: 11.8 g/dL — ABNORMAL LOW (ref 12.0–15.0)
MCH: 31.7 pg (ref 26.0–34.0)
MCHC: 33.4 g/dL (ref 30.0–36.0)
MCV: 94.9 fL (ref 80.0–100.0)
Platelets: 97 10*3/uL — ABNORMAL LOW (ref 150–400)
RBC: 3.72 MIL/uL — ABNORMAL LOW (ref 3.87–5.11)
RDW: 15 % (ref 11.5–15.5)
WBC: 4 10*3/uL (ref 4.0–10.5)
nRBC: 0 % (ref 0.0–0.2)

## 2022-10-15 NOTE — Progress Notes (Signed)
Mobility Specialist - Progress Note   10/15/22 0929  Mobility  Activity Ambulated with assistance in hallway  Level of Assistance Contact guard assist, steadying assist  Assistive Device Front wheel walker  Distance Ambulated (ft) 150 ft  Range of Motion/Exercises Active  Activity Response Tolerated well  Mobility Referral Yes  $Mobility charge 1 Mobility  Mobility Specialist Start Time (ACUTE ONLY) 0910  Mobility Specialist Stop Time (ACUTE ONLY) 0925  Mobility Specialist Time Calculation (min) (ACUTE ONLY) 15 min   Pt was found standing beside bed and husband in room. Pt agreeable to ambulate. Pt needs cues for ambulation and turns. Cues also when going from standing to sitting for hand placement. At EOS was left on recliner chair with call bell in reach and husband in room. Chair alarm on.  Billey Chang Mobility Specialist

## 2022-10-15 NOTE — Progress Notes (Signed)
PROGRESS NOTE    [name]  ZOX:[id number] DOB: [birth year] DOA: 10/12/2022 PCP: Frederica Kuster, MD   Brief Narrative:    [name] is a 77 y.o. female with medical history significant for Elita Boone cirrhosis status post TIPS, chronic diastolic congestive heart failure, chronic anemia, insulin-dependent type 2 diabetes, hypertension, hyperlipidemia, anxiety, subdural hemorrhage April 2024 who has had multiple Klebsiella ESBL UTIs and was admitted to the hospital with concern for recurrent UTI. Now awaiting SNF placement.  Assessment & Plan:   Principal Problem:   UTI (urinary tract infection)  Assessment and Plan:   Staph haemolyticus and Enterococcus faecium UTI - Blood cx NGTD -Continue on Zyvox as prescribed (d2/5)   Insulin-dependent type 2 diabetes-presenting with hyperglycemia likely due to acute infection -Carb controlled diet -Lantus 25 units nightly (slight reduction from home dose) -NovoLog 4 units before breakfast, 6 units before lunch, 8 units before supper -Additionally, she will receive sliding scale -Appreciate diabetes coordinator evaluation   Chronic diastolic congestive heart failure-without evidence of acute exacerbation.  She has received LR at 100 cc/h, blood sugar is normalizing so we will discontinue this. -Continue home Lasix   NASH-continue Zetia, lactulose, rifaximin, Protonix    DVT prophylaxis:SCDs due to prior subarachnoid hemorrhage Code Status: Full Family Communication: None at bedside Disposition Plan:  Status is: Observation The patient will require care spanning > 2 midnights and should be moved to inpatient because: Need for continued IV antibiotics.  Consultants:  None  Procedures:  None  Antimicrobials:  Anti-infectives (From admission, onward)    Start     Dose/Rate Route Frequency Ordered Stop   10/14/22 1200  linezolid (ZYVOX) tablet 600 mg        600 mg Oral Every 12 hours 10/14/22 1114 10/19/22 0959    10/14/22 0715  meropenem (MERREM) 1 g in sodium chloride 0.9 % 100 mL IVPB  Status:  Discontinued        1 g 200 mL/hr over 30 Minutes Intravenous Every 8 hours 10/14/22 0713 10/14/22 1114   10/13/22 1300  rifaximin (XIFAXAN) tablet 550 mg        550 mg Oral 2 times daily 10/13/22 0742     10/13/22 1000  meropenem (MERREM) 1 g in sodium chloride 0.9 % 100 mL IVPB  Status:  Discontinued        1 g 200 mL/hr over 30 Minutes Intravenous Every 12 hours 10/13/22 0813 10/14/22 0713   10/13/22 0100  meropenem (MERREM) 1 g in sodium chloride 0.9 % 100 mL IVPB        1 g 200 mL/hr over 30 Minutes Intravenous  Once 10/13/22 0048 10/13/22 0134      Subjective: Patient seen and evaluated today with no new acute complaints or concerns. No acute concerns or events noted overnight.  Objective: Vitals:   10/14/22 1324 10/14/22 1920 10/14/22 2119 10/15/22 0408  BP: (!) 111/43 (!) 127/55 (!) 118/48 130/60  Pulse: 71 72 73 66  Resp: 16 18 16 18   Temp: 99 F (37.2 C) 98.2 F (36.8 C) 98.8 F (37.1 C) 98.3 F (36.8 C)  TempSrc: Oral Oral Oral Oral  SpO2: 100% 100% 98% 100%  Weight:      Height:        Intake/Output Summary (Last 24 hours) at 10/15/2022 1350 Last data filed at 10/14/2022 1729 Gross per 24 hour  Intake 440 ml  Output --  Net 440 ml   Filed Weights   10/13/22 0617  Weight: 55.3 kg    Examination:  General exam: Appears calm and comfortable  Respiratory system: Clear to auscultation. Respiratory effort normal. Cardiovascular system: S1 & S2 heard, RRR.  Gastrointestinal system: Abdomen is soft Central nervous system: Alert and awake Extremities: No edema Skin: No significant lesions noted Psychiatry: Flat affect.    Data Reviewed: I have personally reviewed following labs and imaging studies  CBC: Recent Labs  Lab 10/12/22 2159 10/12/22 2256 10/14/22 0420 10/15/22 0422  WBC 4.2  --  3.5* 4.0  HGB 12.9 12.6 11.1* 11.8*  HCT 38.6 37.0 33.6* 35.3*  MCV 91.5   --  94.4 94.9  PLT 130*  --  99* 97*   Basic Metabolic Panel: Recent Labs  Lab 10/12/22 2159 10/12/22 2256 10/14/22 0420 10/15/22 0422  NA 135 136 137 133*  K 3.8 3.4* 2.8* 4.1  CL 97*  --  103 102  CO2 32  --  27 24  GLUCOSE 548*  --  303* 216*  BUN 17  --  16 16  CREATININE 0.84  --  0.77 0.65  CALCIUM 11.0*  --  9.1 9.4  MG  --   --  1.8 1.8   GFR: Estimated Creatinine Clearance: 50.3 mL/min (by C-G formula based on SCr of 0.65 mg/dL). Liver Function Tests: No results for input(s): "AST", "ALT", "ALKPHOS", "BILITOT", "PROT", "ALBUMIN" in the last 168 hours. No results for input(s): "LIPASE", "AMYLASE" in the last 168 hours. No results for input(s): "AMMONIA" in the last 168 hours.  Coagulation Profile: No results for input(s): "INR", "PROTIME" in the last 168 hours. Cardiac Enzymes: No results for input(s): "CKTOTAL", "CKMB", "CKMBINDEX", "TROPONINI" in the last 168 hours. BNP (last 3 results) No results for input(s): "PROBNP" in the last 8760 hours. HbA1C: No results for input(s): "HGBA1C" in the last 72 hours. CBG: Recent Labs  Lab 10/14/22 1159 10/14/22 1637 10/14/22 2230 10/15/22 0739 10/15/22 1207  GLUCAP 335* 323* 232* 184* 311*   Lipid Profile: No results for input(s): "CHOL", "HDL", "LDLCALC", "TRIG", "CHOLHDL", "LDLDIRECT" in the last 72 hours. Thyroid Function Tests: No results for input(s): "TSH", "T4TOTAL", "FREET4", "T3FREE", "THYROIDAB" in the last 72 hours. Anemia Panel: No results for input(s): "VITAMINB12", "FOLATE", "FERRITIN", "TIBC", "IRON", "RETICCTPCT" in the last 72 hours. Sepsis Labs: No results for input(s): "PROCALCITON", "LATICACIDVEN" in the last 168 hours.  Recent Results (from the past 240 hour(s))  Resp panel by RT-PCR (RSV, Flu A&B, Covid) Anterior Nasal Swab     Status: None   Collection Time: 10/07/22  9:01 PM   Specimen: Anterior Nasal Swab  Result Value Ref Range Status   SARS Coronavirus 2 by RT PCR NEGATIVE NEGATIVE  Final    Comment: (NOTE) SARS-CoV-2 target nucleic acids are NOT DETECTED.  The SARS-CoV-2 RNA is generally detectable in upper respiratory specimens during the acute phase of infection. The lowest concentration of SARS-CoV-2 viral copies this assay can detect is 138 copies/mL. A negative result does not preclude SARS-Cov-2 infection and should not be used as the sole basis for treatment or other patient management decisions. A negative result may occur with  improper specimen collection/handling, submission of specimen other than nasopharyngeal swab, presence of viral mutation(s) within the areas targeted by this assay, and inadequate number of viral copies(<138 copies/mL). A negative result must be combined with clinical observations, patient history, and epidemiological information. The expected result is Negative.  Fact Sheet for Patients:  BloggerCourse.com  Fact Sheet for Healthcare Providers:  SeriousBroker.it  This test  is no t yet approved or cleared by the [place] and  has been authorized for detection and/or diagnosis of SARS-CoV-2 by FDA under Kayla Emergency Use Authorization (EUA). This EUA will remain  in effect (meaning this test can be used) for the duration of the COVID-19 declaration under Section 564(b)(1) of the Act, 21 U.S.C.section 360bbb-3(b)(1), unless the authorization is terminated  or revoked sooner.       Influenza A by PCR NEGATIVE NEGATIVE Final   Influenza B by PCR NEGATIVE NEGATIVE Final    Comment: (NOTE) The Xpert Xpress SARS-CoV-2/FLU/RSV plus assay is intended as Kayla aid in the diagnosis of influenza from Nasopharyngeal swab specimens and should not be used as a sole basis for treatment. Nasal washings and aspirates are unacceptable for Xpert Xpress SARS-CoV-2/FLU/RSV testing.  Fact Sheet for Patients: BloggerCourse.com  Fact Sheet for Healthcare  Providers: SeriousBroker.it  This test is not yet approved or cleared by the [place] FDA and has been authorized for detection and/or diagnosis of SARS-CoV-2 by FDA under Kayla Emergency Use Authorization (EUA). This EUA will remain in effect (meaning this test can be used) for the duration of the COVID-19 declaration under Section 564(b)(1) of the Act, 21 U.S.C. section 360bbb-3(b)(1), unless the authorization is terminated or revoked.     Resp Syncytial Virus by PCR NEGATIVE NEGATIVE Final    Comment: (NOTE) Fact Sheet for Patients: BloggerCourse.com  Fact Sheet for Healthcare Providers: SeriousBroker.it  This test is not yet approved or cleared by the [place] FDA and has been authorized for detection and/or diagnosis of SARS-CoV-2 by FDA under Kayla Emergency Use Authorization (EUA). This EUA will remain in effect (meaning this test can be used) for the duration of the COVID-19 declaration under Section 564(b)(1) of the Act, 21 U.S.C. section 360bbb-3(b)(1), unless the authorization is terminated or revoked.  Performed at [facility], [address] [place], [place] [address]   Urine Culture (for pregnant, neutropenic or urologic patients or patients with Kayla indwelling urinary catheter)     Status: Abnormal   Collection Time: 10/13/22  8:02 AM   Specimen: Urine, Clean Catch  Result Value Ref Range Status   Specimen Description   Final    URINE, CLEAN CATCH Performed at [facility], [address] [place], [place] [address]    Special Requests   Final    Immunocompromised Performed at [facility], [address], [place], [place] [address]    Culture (A)  Final    40,000 COLONIES/mL STAPHYLOCOCCUS HAEMOLYTICUS 40,000 COLONIES/mL ENTEROCOCCUS FAECIUM    Report Status 10/15/2022 FINAL  Final   Organism ID, Bacteria ENTEROCOCCUS  FAECIUM (A)  Final   Organism ID, Bacteria STAPHYLOCOCCUS HAEMOLYTICUS (A)  Final      Susceptibility   Enterococcus faecium - MIC*    AMPICILLIN >=32 RESISTANT Resistant     NITROFURANTOIN 32 SENSITIVE Sensitive     VANCOMYCIN <=0.5 SENSITIVE Sensitive     * 40,000 COLONIES/mL ENTEROCOCCUS FAECIUM   Staphylococcus haemolyticus - MIC*    CIPROFLOXACIN >=8 RESISTANT Resistant     GENTAMICIN >=16 RESISTANT Resistant     NITROFURANTOIN <=16 SENSITIVE Sensitive     OXACILLIN >=4 RESISTANT Resistant     TETRACYCLINE 2 SENSITIVE Sensitive     VANCOMYCIN 1 SENSITIVE Sensitive     TRIMETH/SULFA >=320 RESISTANT Resistant     CLINDAMYCIN >=8 RESISTANT Resistant     RIFAMPIN >=32 RESISTANT Resistant     Inducible Clindamycin NEGATIVE  Sensitive     * 40,000 COLONIES/mL STAPHYLOCOCCUS HAEMOLYTICUS  Culture, blood (Routine X 2) w Reflex to ID Panel     Status: None (Preliminary result)   Collection Time: 10/13/22  8:56 AM   Specimen: BLOOD LEFT ARM  Result Value Ref Range Status   Specimen Description   Final    BLOOD LEFT ARM Performed at [facility], [address], [place], [place] [address]    Special Requests   Final    BOTTLES DRAWN AEROBIC AND ANAEROBIC Blood Culture adequate volume Performed at [facility], [address], [place], [place] [address]    Culture   Final    NO GROWTH 2 DAYS Performed at [facility], [address] [place], [place] [address]    Report Status PENDING  Incomplete  Culture, blood (Routine X 2) w Reflex to ID Panel     Status: None (Preliminary result)   Collection Time: 10/13/22  8:56 AM   Specimen: BLOOD RIGHT ARM  Result Value Ref Range Status   Specimen Description   Final    BLOOD RIGHT ARM Performed at [facility], [address], [place], [place] [address]    Special Requests   Final    BOTTLES DRAWN AEROBIC AND ANAEROBIC Blood Culture adequate volume Performed at [facility], [address] [place], [place] [address]    Culture   Final    NO GROWTH 2 DAYS Performed at [facility], [address] [place], [place] [address]    Report Status PENDING  Incomplete         Radiology Studies: No results found.      Scheduled Meds:  artificial tears   Both Eyes QHS   ezetimibe  10 mg Oral Daily   insulin aspart  0-15 Units Subcutaneous TID WC   insulin aspart  0-5 Units Subcutaneous QHS   insulin aspart  4 Units Subcutaneous QAC breakfast   insulin aspart  6 Units Subcutaneous QAC lunch   insulin aspart  8 Units Subcutaneous QAC supper   insulin glargine-yfgn  25 Units Subcutaneous QHS   lactulose  20 g Oral Daily   linezolid  600 mg Oral Q12H   pantoprazole  40 mg Oral Daily   potassium chloride  40 mEq Oral BID   rifaximin  550 mg Oral BID    LOS: 0 days    Time spent: 35 minutes    [name] Chaikin Hoover Brunette, DO [facility]  If 7PM-7AM, please contact night-coverage [email or web address] 10/15/2022, 1:50 PM

## 2022-10-16 DIAGNOSIS — N3000 Acute cystitis without hematuria: Secondary | ICD-10-CM | POA: Diagnosis not present

## 2022-10-16 DIAGNOSIS — R739 Hyperglycemia, unspecified: Secondary | ICD-10-CM

## 2022-10-16 LAB — CULTURE, BLOOD (ROUTINE X 2): Culture: NO GROWTH

## 2022-10-16 LAB — GLUCOSE, CAPILLARY
Glucose-Capillary: 192 mg/dL — ABNORMAL HIGH (ref 70–99)
Glucose-Capillary: 345 mg/dL — ABNORMAL HIGH (ref 70–99)
Glucose-Capillary: 252 mg/dL — ABNORMAL HIGH (ref 70–99)
Glucose-Capillary: 215 mg/dL — ABNORMAL HIGH (ref 70–99)

## 2022-10-16 LAB — BASIC METABOLIC PANEL
Anion gap: 4 — ABNORMAL LOW (ref 5–15)
BUN: 16 mg/dL (ref 8–23)
CO2: 24 mmol/L (ref 22–32)
Calcium: 9.5 mg/dL (ref 8.9–10.3)
Chloride: 106 mmol/L (ref 98–111)
Creatinine, Ser: 0.72 mg/dL (ref 0.44–1.00)
GFR, Estimated: >60 mL/min (ref >60)
Glucose, Bld: 255 mg/dL — ABNORMAL HIGH (ref 70–99)
Potassium: 4.9 mmol/L (ref 3.5–5.1)
Sodium: 134 mmol/L — ABNORMAL LOW (ref 135–145)

## 2022-10-16 LAB — CBC
HCT: 34.6 % — ABNORMAL LOW (ref 36.0–46.0)
Hemoglobin: 11 g/dL — ABNORMAL LOW (ref 12.0–15.0)
MCH: 30.6 pg (ref 26.0–34.0)
MCHC: 31.8 g/dL (ref 30.0–36.0)
MCV: 96.4 fL (ref 80.0–100.0)
Platelets: 100 10*3/uL — ABNORMAL LOW (ref 150–400)
RBC: 3.59 MIL/uL — ABNORMAL LOW (ref 3.87–5.11)
RDW: 15.2 % (ref 11.5–15.5)
WBC: 3.9 10*3/uL — ABNORMAL LOW (ref 4.0–10.5)
nRBC: 0 % (ref 0.0–0.2)

## 2022-10-16 LAB — MAGNESIUM: Magnesium: 1.9 mg/dL (ref 1.7–2.4)

## 2022-10-16 NOTE — Progress Notes (Signed)
[facility]  PROGRESS NOTE  [name] ZOX:[id number] DOB: March 23, 1946 DOA: 10/12/2022 PCP: Frederica Kuster, MD   Brief HPI:   77 year old female with history of Elita Boone cirrhosis status post TIPS procedure, chronic diastolic heart failure, chronic anemia, diabetes mellitus type 2, hypertension, hyperlipidemia, anxiety, subdural hemorrhage April 2024 with multiple Klebsiella UTIs, came to hospital with concern for recurrent UTI.  Awaiting skilled facility placement.    Assessment/Plan:   UTI -Urine culture grew staph hemolyticus, Enterococcus faecium -Started on Zyvox day 3/5  Diabetes mellitus type 2 -Presented with hyperglycemia; continue carb modified diet -Started on Lantus 25 units subcu nightly -NovoLog 4 units before breakfast, 6 units before lunch, 8 units before supper  -Continue sliding scale insulin with NovoLog -CBG well-controlled  Chronic diastolic heart failure -Not in acute exacerbation  NASH -Continue Zetia, lactulose, rifaximin, Protonix  Disposition -Awaiting bed at skilled nursing facility  Medications     artificial tears   Both Eyes QHS   ezetimibe  10 mg Oral Daily   insulin aspart  0-15 Units Subcutaneous TID WC   insulin aspart  0-5 Units Subcutaneous QHS   insulin aspart  4 Units Subcutaneous QAC breakfast   insulin aspart  6 Units Subcutaneous QAC lunch   insulin aspart  8 Units Subcutaneous QAC supper   insulin glargine-yfgn  25 Units Subcutaneous QHS   lactulose  20 g Oral Daily   linezolid  600 mg Oral Q12H   pantoprazole  40 mg Oral Daily   potassium chloride  40 mEq Oral BID   rifaximin  550 mg Oral BID     Data Reviewed:   CBG:  Recent Labs  Lab 10/15/22 0739 10/15/22 1207 10/15/22 1628 10/15/22 2312 10/16/22 0752  GLUCAP 184* 311* 257* 175* 192*    SpO2: 98 %    Vitals:   10/15/22 0408 10/15/22 1600 10/15/22 2307 10/16/22 0403  BP: 130/60 (!) 119/58 (!) 134/54 (!) 131/55  Pulse: 66 69 64 69  Resp:  18 18 18 16   Temp: 98.3 F (36.8 C) 98.6 F (37 C) 98.2 F (36.8 C) 98.2 F (36.8 C)  TempSrc: Oral Oral  Oral  SpO2: 100% 97% 100% 98%  Weight:      Height:          Data Reviewed:  Basic Metabolic Panel: Recent Labs  Lab 10/12/22 2159 10/12/22 2256 10/14/22 0420 10/15/22 0422 10/16/22 0357  NA 135 136 137 133* 134*  K 3.8 3.4* 2.8* 4.1 4.9  CL 97*  --  103 102 106  CO2 32  --  27 24 24   GLUCOSE 548*  --  303* 216* 255*  BUN 17  --  16 16 16   CREATININE 0.84  --  0.77 0.65 0.72  CALCIUM 11.0*  --  9.1 9.4 9.5  MG  --   --  1.8 1.8 1.9    CBC: Recent Labs  Lab 10/12/22 2159 10/12/22 2256 10/14/22 0420 10/15/22 0422 10/16/22 0357  WBC 4.2  --  3.5* 4.0 3.9*  HGB 12.9 12.6 11.1* 11.8* 11.0*  HCT 38.6 37.0 33.6* 35.3* 34.6*  MCV 91.5  --  94.4 94.9 96.4  PLT 130*  --  99* 97* 100*    LFT No results for input(s): "AST", "ALT", "ALKPHOS", "BILITOT", "PROT", "ALBUMIN" in the last 168 hours.   Antibiotics: Anti-infectives (From admission, onward)    Start     Dose/Rate Route Frequency Ordered Stop   10/14/22 1200  linezolid (ZYVOX) tablet 600  mg        600 mg Oral Every 12 hours 10/14/22 1114 10/19/22 0959   10/14/22 0715  meropenem (MERREM) 1 g in sodium chloride 0.9 % 100 mL IVPB  Status:  Discontinued        1 g 200 mL/hr over 30 Minutes Intravenous Every 8 hours 10/14/22 0713 10/14/22 1114   10/13/22 1300  rifaximin (XIFAXAN) tablet 550 mg        550 mg Oral 2 times daily 10/13/22 0742     10/13/22 1000  meropenem (MERREM) 1 g in sodium chloride 0.9 % 100 mL IVPB  Status:  Discontinued        1 g 200 mL/hr over 30 Minutes Intravenous Every 12 hours 10/13/22 0813 10/14/22 0713   10/13/22 0100  meropenem (MERREM) 1 g in sodium chloride 0.9 % 100 mL IVPB        1 g 200 mL/hr over 30 Minutes Intravenous  Once 10/13/22 0048 10/13/22 0134        DVT prophylaxis: SCDs, no Lovenox due to prior history of SDH  Code Status: Full code  Family  Communication:    CONSULTS    Subjective   Denies pain or shortness of breath   Objective    Physical Examination:   General: Appears in no acute distress Cardiovascular: S1-S2, regular, no murmur auscultated Respiratory: Lungs clear to auscultation bilaterally Abdomen: Soft, nontender, no organomegaly Extremities: No edema in the lower extremities Neurologic:     Status is: Inpatient:      Pressure Injury 09/03/22 Sacrum Right;Medial Stage 2 -  Partial thickness loss of dermis presenting as a shallow open injury with a red, pink wound bed without slough. (Active)  09/03/22 1151  Location: Sacrum  Location Orientation: Right;Medial  Staging: Stage 2 -  Partial thickness loss of dermis presenting as a shallow open injury with a red, pink wound bed without slough.  Wound Description (Comments):   Present on Admission: Yes        [name] S [name]   [facility] If 7PM-7AM, please contact night-coverage at [email or web address], Office  [phone]   10/16/2022, 9:03 AM  LOS: 1 day

## 2022-10-17 DIAGNOSIS — R739 Hyperglycemia, unspecified: Secondary | ICD-10-CM | POA: Diagnosis not present

## 2022-10-17 DIAGNOSIS — N3000 Acute cystitis without hematuria: Secondary | ICD-10-CM | POA: Diagnosis not present

## 2022-10-17 LAB — GLUCOSE, CAPILLARY
Glucose-Capillary: 141 mg/dL — ABNORMAL HIGH (ref 70–99)
Glucose-Capillary: 222 mg/dL — ABNORMAL HIGH (ref 70–99)
Glucose-Capillary: 250 mg/dL — ABNORMAL HIGH (ref 70–99)
Glucose-Capillary: 283 mg/dL — ABNORMAL HIGH (ref 70–99)

## 2022-10-17 LAB — BASIC METABOLIC PANEL
Anion gap: 6 (ref 5–15)
BUN: 15 mg/dL (ref 8–23)
CO2: 23 mmol/L (ref 22–32)
Calcium: 9.1 mg/dL (ref 8.9–10.3)
Chloride: 106 mmol/L (ref 98–111)
Creatinine, Ser: 0.8 mg/dL (ref 0.44–1.00)
GFR, Estimated: >60 mL/min (ref >60)
Glucose, Bld: 177 mg/dL — ABNORMAL HIGH (ref 70–99)
Potassium: 4.1 mmol/L (ref 3.5–5.1)
Sodium: 135 mmol/L (ref 135–145)

## 2022-10-17 LAB — CBC
HCT: 33.3 % — ABNORMAL LOW (ref 36.0–46.0)
Hemoglobin: 10.6 g/dL — ABNORMAL LOW (ref 12.0–15.0)
MCH: 30.6 pg (ref 26.0–34.0)
MCHC: 31.8 g/dL (ref 30.0–36.0)
MCV: 96.2 fL (ref 80.0–100.0)
Platelets: 106 10*3/uL — ABNORMAL LOW (ref 150–400)
RBC: 3.46 MIL/uL — ABNORMAL LOW (ref 3.87–5.11)
RDW: 15.1 % (ref 11.5–15.5)
WBC: 3.5 10*3/uL — ABNORMAL LOW (ref 4.0–10.5)
nRBC: 0 % (ref 0.0–0.2)

## 2022-10-17 LAB — CULTURE, BLOOD (ROUTINE X 2): Culture: NO GROWTH

## 2022-10-17 NOTE — Progress Notes (Signed)
Mobility Specialist - Progress Note   10/17/22 1102  Mobility  Activity Ambulated with assistance in hallway  Level of Assistance Contact guard assist, steadying assist  Assistive Device Front wheel walker  Distance Ambulated (ft) 140 ft  Range of Motion/Exercises Active  Activity Response Tolerated well  Mobility Referral Yes  $Mobility charge 1 Mobility  Mobility Specialist Start Time (ACUTE ONLY) 1050  Mobility Specialist Stop Time (ACUTE ONLY) 1100  Mobility Specialist Time Calculation (min) (ACUTE ONLY) 10 min   Pt received in chair and agreed to mobility. Had no issues throughout session, returned to bed with all needs met, alarm on and family in room.  Marilynne Halsted Mobility Specialist

## 2022-10-17 NOTE — TOC Progression Note (Signed)
Transition of Care [facility]) - Progression Note    Patient Details  Name: [name] MRN: [id number] Date of Birth: 03-Jan-1946  Transition of Care [facility] CM/SW Contact  Otelia Santee, LCSW Phone Number: 10/17/2022, 3:29 PM  Clinical Narrative:    Spoke with pt's son to review bed offers. Pt's son is interested in [facility] and states he has called them to review pt's referral. CSW reached out to admissions who share they are waiting to hear back from their nursing director before being able to extend bed offer.    Expected Discharge Plan: Skilled Nursing Facility Barriers to Discharge: Continued Medical Work up, English as a second language teacher, SNF Pending bed offer  Expected Discharge Plan and Services In-house Referral: Clinical Social Work   Post Acute Care Choice: Skilled Nursing Facility Living arrangements for the past 2 months: Single Family Home                 DME Arranged: N/A DME Agency: NA                   Social Determinants of Health (SDOH) Interventions SDOH Screenings   Food Insecurity: No Food Insecurity (10/13/2022)  Housing: Low Risk  (10/13/2022)  Transportation Needs: No Transportation Needs (10/13/2022)  Utilities: Not At Risk (10/13/2022)  Alcohol Screen: Low Risk  (06/04/2018)  Depression (PHQ2-9): Low Risk  (08/15/2022)  Financial Resource Strain: Low Risk  (05/29/2017)  Physical Activity: Inactive (05/29/2017)  Social Connections: Moderately Integrated (05/29/2017)  Stress: No Stress Concern Present (05/29/2017)  Tobacco Use: Low Risk  (10/12/2022)    Readmission Risk Interventions    07/03/2022   12:13 PM  Readmission Risk Prevention Plan  Transportation Screening Complete  Medication Review (RN Care Manager) Not Complete  PCP or Specialist appointment within 3-5 days of discharge Complete  SW Recovery Care/Counseling Consult Complete  Palliative Care Screening Not Complete  Comments patient will go to skilled nursing  Skilled Nursing Facility  Complete

## 2022-10-17 NOTE — Progress Notes (Signed)
[facility]  PROGRESS NOTE  [name] WGN:[id number] DOB: 04/20/1946 DOA: 10/12/2022 PCP: Frederica Kuster, MD   Brief HPI:   77 year old female with history of Elita Boone cirrhosis status post TIPS procedure, chronic diastolic heart failure, chronic anemia, diabetes mellitus type 2, hypertension, hyperlipidemia, anxiety, subdural hemorrhage April 2024 with multiple Klebsiella UTIs, came to hospital with concern for recurrent UTI.  Awaiting skilled facility placement.    Assessment/Plan:   UTI -Urine culture grew staph hemolyticus, Enterococcus faecium -Started on Zyvox day 4/5  Diabetes mellitus type 2 -Presented with hyperglycemia; continue carb modified diet -Started on Lantus 25 units subcu nightly -NovoLog 4 units before breakfast, 6 units before lunch, 8 units before supper  -Continue sliding scale insulin with NovoLog -CBG well-controlled  Chronic diastolic heart failure -Not in acute exacerbation  NASH -Continue Zetia, lactulose, rifaximin, Protonix  Disposition -Awaiting bed at skilled nursing facility  Medications     artificial tears   Both Eyes QHS   ezetimibe  10 mg Oral Daily   insulin aspart  0-15 Units Subcutaneous TID WC   insulin aspart  0-5 Units Subcutaneous QHS   insulin aspart  4 Units Subcutaneous QAC breakfast   insulin aspart  6 Units Subcutaneous QAC lunch   insulin aspart  8 Units Subcutaneous QAC supper   insulin glargine-yfgn  25 Units Subcutaneous QHS   lactulose  20 g Oral Daily   linezolid  600 mg Oral Q12H   pantoprazole  40 mg Oral Daily   rifaximin  550 mg Oral BID     Data Reviewed:   CBG:  Recent Labs  Lab 10/16/22 0752 10/16/22 1154 10/16/22 1732 10/16/22 2041 10/17/22 0741  GLUCAP 192* 345* 252* 215* 141*    SpO2: 99 %    Vitals:   10/16/22 0403 10/16/22 1451 10/16/22 2039 10/17/22 0530  BP: (!) 131/55 (!) 111/58 (!) 115/46 (!) 123/56  Pulse: 69 70 70 68  Resp: 16 18 18 16   Temp: 98.2 F (36.8  C) 98 F (36.7 C) 98.7 F (37.1 C) 98.3 F (36.8 C)  TempSrc: Oral Oral Oral   SpO2: 98% 100% 99% 99%  Weight:      Height:          Data Reviewed:  Basic Metabolic Panel: Recent Labs  Lab 10/12/22 2159 10/12/22 2256 10/14/22 0420 10/15/22 0422 10/16/22 0357 10/17/22 0356  NA 135 136 137 133* 134* 135  K 3.8 3.4* 2.8* 4.1 4.9 4.1  CL 97*  --  103 102 106 106  CO2 32  --  27 24 24 23   GLUCOSE 548*  --  303* 216* 255* 177*  BUN 17  --  16 16 16 15   CREATININE 0.84  --  0.77 0.65 0.72 0.80  CALCIUM 11.0*  --  9.1 9.4 9.5 9.1  MG  --   --  1.8 1.8 1.9  --     CBC: Recent Labs  Lab 10/12/22 2159 10/12/22 2256 10/14/22 0420 10/15/22 0422 10/16/22 0357 10/17/22 0356  WBC 4.2  --  3.5* 4.0 3.9* 3.5*  HGB 12.9 12.6 11.1* 11.8* 11.0* 10.6*  HCT 38.6 37.0 33.6* 35.3* 34.6* 33.3*  MCV 91.5  --  94.4 94.9 96.4 96.2  PLT 130*  --  99* 97* 100* 106*    LFT No results for input(s): "AST", "ALT", "ALKPHOS", "BILITOT", "PROT", "ALBUMIN" in the last 168 hours.   Antibiotics: Anti-infectives (From admission, onward)    Start     Dose/Rate Route  Frequency Ordered Stop   10/14/22 1200  linezolid (ZYVOX) tablet 600 mg        600 mg Oral Every 12 hours 10/14/22 1114 10/19/22 0959   10/14/22 0715  meropenem (MERREM) 1 g in sodium chloride 0.9 % 100 mL IVPB  Status:  Discontinued        1 g 200 mL/hr over 30 Minutes Intravenous Every 8 hours 10/14/22 0713 10/14/22 1114   10/13/22 1300  rifaximin (XIFAXAN) tablet 550 mg        550 mg Oral 2 times daily 10/13/22 0742     10/13/22 1000  meropenem (MERREM) 1 g in sodium chloride 0.9 % 100 mL IVPB  Status:  Discontinued        1 g 200 mL/hr over 30 Minutes Intravenous Every 12 hours 10/13/22 0813 10/14/22 0713   10/13/22 0100  meropenem (MERREM) 1 g in sodium chloride 0.9 % 100 mL IVPB        1 g 200 mL/hr over 30 Minutes Intravenous  Once 10/13/22 0048 10/13/22 0134        DVT prophylaxis: SCDs, no Lovenox due to prior  history of SDH  Code Status: Full code  Family Communication:    CONSULTS    Subjective   Patient seen and examined, no new complaints.   Objective    Physical Examination:  General-appears in no acute distress Heart-S1-S2, regular, no murmur auscultated Lungs-clear to auscultation bilaterally, no wheezing or crackles auscultated Abdomen-soft, nontender, no organomegaly Extremities-no edema in the lower extremities Neuro-alert, oriented x3, no focal deficit noted   Status is: Inpatient:      Pressure Injury 09/03/22 Sacrum Right;Medial Stage 2 -  Partial thickness loss of dermis presenting as a shallow open injury with a red, pink wound bed without slough. (Active)  09/03/22 1151  Location: Sacrum  Location Orientation: Right;Medial  Staging: Stage 2 -  Partial thickness loss of dermis presenting as a shallow open injury with a red, pink wound bed without slough.  Wound Description (Comments):   Present on Admission: Yes        [name]   [facility] If 7PM-7AM, please contact night-coverage at [email or web address], Office  [phone]   10/17/2022, 8:12 AM  LOS: 2 days

## 2022-10-17 NOTE — Progress Notes (Signed)
PT Cancellation Note  Patient Details Name: [name] MRN: [id number] DOB: 1946-01-22   Cancelled Treatment:    Reason Eval/Treat Not Completed: Fatigue/lethargy limiting ability to participate (pt reports she's too tired to get out of bed at present. Will follow.)   Tamala Ser PT 10/17/2022  Acute Rehabilitation Services  Office [phone]

## 2022-10-18 DIAGNOSIS — Z9181 History of falling: Secondary | ICD-10-CM | POA: Diagnosis not present

## 2022-10-18 DIAGNOSIS — L89152 Pressure ulcer of sacral region, stage 2: Secondary | ICD-10-CM | POA: Diagnosis not present

## 2022-10-18 DIAGNOSIS — E118 Type 2 diabetes mellitus with unspecified complications: Secondary | ICD-10-CM | POA: Diagnosis not present

## 2022-10-18 DIAGNOSIS — G8929 Other chronic pain: Secondary | ICD-10-CM | POA: Diagnosis not present

## 2022-10-18 DIAGNOSIS — K219 Gastro-esophageal reflux disease without esophagitis: Secondary | ICD-10-CM | POA: Diagnosis not present

## 2022-10-18 DIAGNOSIS — I5032 Chronic diastolic (congestive) heart failure: Secondary | ICD-10-CM | POA: Diagnosis not present

## 2022-10-18 DIAGNOSIS — M545 Low back pain, unspecified: Secondary | ICD-10-CM | POA: Diagnosis not present

## 2022-10-18 DIAGNOSIS — E785 Hyperlipidemia, unspecified: Secondary | ICD-10-CM | POA: Diagnosis not present

## 2022-10-18 DIAGNOSIS — R062 Wheezing: Secondary | ICD-10-CM | POA: Diagnosis not present

## 2022-10-18 DIAGNOSIS — M62838 Other muscle spasm: Secondary | ICD-10-CM | POA: Diagnosis not present

## 2022-10-18 DIAGNOSIS — E1151 Type 2 diabetes mellitus with diabetic peripheral angiopathy without gangrene: Secondary | ICD-10-CM | POA: Diagnosis not present

## 2022-10-18 DIAGNOSIS — R739 Hyperglycemia, unspecified: Secondary | ICD-10-CM | POA: Diagnosis not present

## 2022-10-18 DIAGNOSIS — K7460 Unspecified cirrhosis of liver: Secondary | ICD-10-CM | POA: Diagnosis not present

## 2022-10-18 DIAGNOSIS — S065X0D Traumatic subdural hemorrhage without loss of consciousness, subsequent encounter: Secondary | ICD-10-CM | POA: Diagnosis not present

## 2022-10-18 DIAGNOSIS — Z1612 Extended spectrum beta lactamase (ESBL) resistance: Secondary | ICD-10-CM

## 2022-10-18 DIAGNOSIS — D649 Anemia, unspecified: Secondary | ICD-10-CM | POA: Diagnosis not present

## 2022-10-18 DIAGNOSIS — A499 Bacterial infection, unspecified: Secondary | ICD-10-CM

## 2022-10-18 DIAGNOSIS — R410 Disorientation, unspecified: Secondary | ICD-10-CM | POA: Diagnosis not present

## 2022-10-18 DIAGNOSIS — Z87440 Personal history of urinary (tract) infections: Secondary | ICD-10-CM | POA: Diagnosis not present

## 2022-10-18 DIAGNOSIS — E559 Vitamin D deficiency, unspecified: Secondary | ICD-10-CM | POA: Diagnosis not present

## 2022-10-18 DIAGNOSIS — N390 Urinary tract infection, site not specified: Secondary | ICD-10-CM | POA: Diagnosis not present

## 2022-10-18 DIAGNOSIS — Z7401 Bed confinement status: Secondary | ICD-10-CM | POA: Diagnosis not present

## 2022-10-18 DIAGNOSIS — R52 Pain, unspecified: Secondary | ICD-10-CM | POA: Diagnosis not present

## 2022-10-18 DIAGNOSIS — E46 Unspecified protein-calorie malnutrition: Secondary | ICD-10-CM | POA: Diagnosis not present

## 2022-10-18 DIAGNOSIS — G9341 Metabolic encephalopathy: Secondary | ICD-10-CM | POA: Diagnosis not present

## 2022-10-18 DIAGNOSIS — N3000 Acute cystitis without hematuria: Secondary | ICD-10-CM | POA: Diagnosis not present

## 2022-10-18 DIAGNOSIS — E43 Unspecified severe protein-calorie malnutrition: Secondary | ICD-10-CM | POA: Diagnosis not present

## 2022-10-18 DIAGNOSIS — K7581 Nonalcoholic steatohepatitis (NASH): Secondary | ICD-10-CM | POA: Diagnosis not present

## 2022-10-18 DIAGNOSIS — H04123 Dry eye syndrome of bilateral lacrimal glands: Secondary | ICD-10-CM | POA: Diagnosis not present

## 2022-10-18 DIAGNOSIS — M542 Cervicalgia: Secondary | ICD-10-CM | POA: Diagnosis not present

## 2022-10-18 DIAGNOSIS — Z79899 Other long term (current) drug therapy: Secondary | ICD-10-CM | POA: Diagnosis not present

## 2022-10-18 DIAGNOSIS — E538 Deficiency of other specified B group vitamins: Secondary | ICD-10-CM | POA: Diagnosis not present

## 2022-10-18 DIAGNOSIS — I739 Peripheral vascular disease, unspecified: Secondary | ICD-10-CM | POA: Diagnosis not present

## 2022-10-18 DIAGNOSIS — I10 Essential (primary) hypertension: Secondary | ICD-10-CM | POA: Diagnosis not present

## 2022-10-18 DIAGNOSIS — M1990 Unspecified osteoarthritis, unspecified site: Secondary | ICD-10-CM | POA: Diagnosis not present

## 2022-10-18 DIAGNOSIS — F419 Anxiety disorder, unspecified: Secondary | ICD-10-CM | POA: Diagnosis not present

## 2022-10-18 LAB — BASIC METABOLIC PANEL
Anion gap: 5 (ref 5–15)
BUN: 17 mg/dL (ref 8–23)
CO2: 24 mmol/L (ref 22–32)
Calcium: 9.1 mg/dL (ref 8.9–10.3)
Chloride: 107 mmol/L (ref 98–111)
Creatinine, Ser: 0.72 mg/dL (ref 0.44–1.00)
GFR, Estimated: >60 mL/min (ref >60)
Glucose, Bld: 186 mg/dL — ABNORMAL HIGH (ref 70–99)
Potassium: 3.9 mmol/L (ref 3.5–5.1)
Sodium: 136 mmol/L (ref 135–145)

## 2022-10-18 LAB — CBC
HCT: 32.3 % — ABNORMAL LOW (ref 36.0–46.0)
Hemoglobin: 10.4 g/dL — ABNORMAL LOW (ref 12.0–15.0)
MCH: 30.8 pg (ref 26.0–34.0)
MCHC: 32.2 g/dL (ref 30.0–36.0)
MCV: 95.6 fL (ref 80.0–100.0)
Platelets: 101 10*3/uL — ABNORMAL LOW (ref 150–400)
RBC: 3.38 MIL/uL — ABNORMAL LOW (ref 3.87–5.11)
RDW: 15.2 % (ref 11.5–15.5)
WBC: 3.3 10*3/uL — ABNORMAL LOW (ref 4.0–10.5)
nRBC: 0 % (ref 0.0–0.2)

## 2022-10-18 LAB — GLUCOSE, CAPILLARY
Glucose-Capillary: 150 mg/dL — ABNORMAL HIGH (ref 70–99)
Glucose-Capillary: 287 mg/dL — ABNORMAL HIGH (ref 70–99)

## 2022-10-18 LAB — CULTURE, BLOOD (ROUTINE X 2): Special Requests: ADEQUATE

## 2022-10-18 MED ORDER — POTASSIUM CHLORIDE CRYS ER 20 MEQ PO TBCR: 20.0000 meq | EXTENDED_RELEASE_TABLET | Freq: Every day | ORAL | 0 refills | Status: DC

## 2022-10-18 MED ORDER — ALBUTEROL SULFATE (2.5 MG/3ML) 0.083% IN NEBU: 2.5000 mg | INHALATION_SOLUTION | Freq: Four times a day (QID) | RESPIRATORY_TRACT | 12 refills | Status: DC | PRN

## 2022-10-18 MED ORDER — LINEZOLID 600 MG PO TABS: 600.0000 mg | ORAL_TABLET | Freq: Once | ORAL | 0 refills | Status: AC

## 2022-10-18 NOTE — Discharge Summary (Addendum)
Physician Discharge Summary   Patient: [name] MRN: [id number] DOB: 1945/11/03  Admit date:     10/12/2022  Discharge date: 10/18/22  Discharge Physician: Meredeth Ide   PCP: Frederica Kuster, MD   Recommendations at discharge:   Patient to be discharged to [facility] nursing home Take Zyvox 600 mg p.o. x 1 dose on 10/18/2022 at bedtime  Discharge Diagnoses: Principal Problem:   UTI (urinary tract infection)  Resolved Problems:   * No resolved hospital problems. *  Hospital Course: 77 year old female with history of Elita Boone cirrhosis status post TIPS procedure, chronic diastolic heart failure, chronic anemia, diabetes mellitus type 2, hypertension, hyperlipidemia, anxiety, subdural hemorrhage April 2024 with multiple Klebsiella UTIs, came to hospital with concern for recurrent UTI.  Awaiting skilled facility placement.    Assessment and Plan:  UTI -Urine culture grew staph hemolyticus, Enterococcus faecium -Started on Zyvox day 5/5 -She will need 1 more dose of Zyvox at the skilled facility to be given tonight   Diabetes mellitus type 2 -Presented with hyperglycemia; continue carb modified diet -CBGs better controlled -Continue home regimen   Chronic diastolic heart failure -Not in acute exacerbation -Continue with p.o. Lasix -Take potassium 20 mEq along with p.o. Lasix   NASH -Continue Zetia, lactulose, rifaximin, Protonix   Disposition -Patient to discharge to skilled nursing facility       Consultants:  Procedures performed:  Disposition: Skilled nursing facility Diet recommendation:  Discharge Diet Orders (From admission, onward)     Start     Ordered   10/18/22 0000  Diet - low sodium heart healthy        10/18/22 1127           Carb modified diet DISCHARGE MEDICATION: Allergies as of 10/18/2022       Reactions   Kiwi Extract Anaphylaxis   Gabapentin Nausea And Vomiting   Tdap [tetanus-diphth-acell Pertussis] Swelling, Other (See  Comments)   Swelling at injection site, gets very hot   Latex Itching, Dermatitis, Rash   Statins Other (See Comments)   RHABDOMYOLYSIS   Tramadol Nausea And Vomiting        Medication List     TAKE these medications    acetaminophen 500 MG tablet Commonly known as: TYLENOL Take 1,000 mg by mouth every 6 (six) hours as needed for moderate pain.   albuterol (2.5 MG/3ML) 0.083% nebulizer solution Commonly known as: PROVENTIL Take 3 mLs (2.5 mg total) by nebulization every 6 (six) hours as needed for wheezing.   artificial tears Oint ophthalmic ointment Commonly known as: LACRILUBE Place into both eyes at bedtime. What changed: how much to take   B-D ULTRAFINE III SHORT PEN 31G X 8 MM Misc Generic drug: Insulin Pen Needle Use to give Insulin injections daily. Dx: E11.59   Basaglar KwikPen 100 UNIT/ML Inject 30 Units into the skin daily.   CALCIUM 600 + D PO Take 1 tablet by mouth in the morning and at bedtime.   cyanocobalamin 1000 MCG tablet Commonly known as: VITAMIN B12 Take 1,000 mcg by mouth daily.   ezetimibe 10 MG tablet Commonly known as: ZETIA Take 1 tablet (10 mg total) by mouth daily.   fish oil-omega-3 fatty acids 1000 MG capsule Take 1 g by mouth daily.   furosemide 20 MG tablet Commonly known as: LASIX Take 40 mg by mouth daily.   glucose blood test strip One Touch Ultra Blue test strips. Use to test blood sugar three times daily. Dx: E11.65  insulin aspart 100 UNIT/ML injection Commonly known as: novoLOG inject 4 units before breakfast. Inject 6 units before lunch. Inject 8 units before supper. Dx: E11.59   INSULIN SYRINGE 1CC/31GX5/16" 31G X 5/16" 1 ML Misc USE AS DIRECTED TO INJECT LEVIMIR   lactulose 10 GM/15ML solution Commonly known as: CHRONULAC TAKE 30 ML BY MOUTH EVERY DAY What changed: See the new instructions.   linezolid 600 MG tablet Commonly known as: ZYVOX Take 1 tablet (600 mg total) by mouth once for 1 dose. Give at 8  pm on 10/18/22   MAGNESIUM PO Take 1 tablet by mouth at bedtime.   methocarbamol 500 MG tablet Commonly known as: ROBAXIN Take 1 tablet (500 mg total) by mouth every 8 (eight) hours as needed (muscle spasm/pain).   nystatin cream Commonly known as: MYCOSTATIN Apply 1 Application topically 2 (two) times daily.   pantoprazole 40 MG tablet Commonly known as: PROTONIX TAKE 1 TABLET(40 MG) BY MOUTH DAILY What changed:  how much to take how to take this when to take this additional instructions   potassium chloride SA 20 MEQ tablet Commonly known as: KLOR-CON M Take 1 tablet (20 mEq total) by mouth daily.   rifaximin 550 MG Tabs tablet Commonly known as: XIFAXAN Take 550 mg by mouth 2 (two) times daily.   Unilet ComforTouch Lancet Misc 1 Device by Does not apply route in the morning and at bedtime.   Vitamin D-3 25 MCG (1000 UT) Caps Take 1,000 Units by mouth daily.        Contact information for after-discharge care     Destination     [facility], [place] Preferred SNF .   Service: Skilled Nursing Contact information: [address] [place] [address] [phone]                    Discharge Exam: Ceasar Mons Weights   10/13/22 0617  Weight: 55.3 kg   General-appears in no acute distress Heart-S1-S2, regular, no murmur auscultated Lungs-clear to auscultation bilaterally, no wheezing or crackles auscultated Abdomen-soft, nontender, no organomegaly Extremities-no edema in the lower extremities Neuro-alert, oriented x3, no focal deficit noted  Condition at discharge: good  The results of significant diagnostics from this hospitalization (including imaging, microbiology, ancillary and laboratory) are listed below for reference.   Imaging Studies: CT Head Wo Contrast  Result Date: 10/07/2022 CLINICAL DATA:  ams EXAM: CT HEAD WITHOUT CONTRAST TECHNIQUE: Contiguous axial images were obtained from the base of the skull  through the vertex without intravenous contrast. RADIATION DOSE REDUCTION: This exam was performed according to the departmental dose-optimization program which includes automated exposure control, adjustment of the mA and/or kV according to patient size and/or use of iterative reconstruction technique. COMPARISON:  Head CT 11 days ago 09/26/2022, multiple additional priors reviewed. FINDINGS: Brain: No acute or recurrent subdural collection. No evidence of acute infarct, hemorrhage, hydrocephalus, or mass lesion/mass effect. Stable chronic ischemic changes. Small area of chronic ischemia in the high left parietal lobe. Vascular: No hyperdense vessel or unexpected calcification. Skull: No fracture or focal lesion. Sinuses/Orbits: No acute finding. Other: None. IMPRESSION: 1. No acute intracranial abnormality. 2. Stable chronic ischemic changes. Electronically Signed   By: Narda Rutherford M.D.   On: 10/07/2022 21:08   DG Chest Port 1 View  Result Date: 10/07/2022 CLINICAL DATA:  Cough EXAM: PORTABLE CHEST 1 VIEW COMPARISON:  Chest x-ray 09/26/2022 and older FINDINGS: Status post median sternotomy. Normal cardiopericardial silhouette with a calcified aorta. No  consolidation, pneumothorax or effusion. No edema. Chronic interstitial lung changes are identified. Artifact from the patient's clothing. In the upper abdomen note is made of presumed tips shunt and embolization material. IMPRESSION: Postop chest with chronic changes. No acute cardiopulmonary disease. Electronically Signed   By: Karen Kays M.D.   On: 10/07/2022 18:50   CT Head Wo Contrast  Result Date: 09/26/2022 CLINICAL DATA:  Follow-up subdural hematoma, weakness EXAM: CT HEAD WITHOUT CONTRAST TECHNIQUE: Contiguous axial images were obtained from the base of the skull through the vertex without intravenous contrast. RADIATION DOSE REDUCTION: This exam was performed according to the departmental dose-optimization program which includes automated  exposure control, adjustment of the mA and/or kV according to patient size and/or use of iterative reconstruction technique. COMPARISON:  09/02/2022 FINDINGS: Brain: No evidence of acute infarction, hemorrhage, hydrocephalus, extra-axial collection or mass lesion/mass effect. Prior minimal subdural hematoma along the falx and left tentorium has resolved. Subcortical white matter and periventricular small vessel ischemic changes. Vascular: Intracranial atherosclerosis. Skull: Normal. Negative for fracture or focal lesion. Sinuses/Orbits: The visualized paranasal sinuses are essentially clear. The mastoid air cells are unopacified. Other: None. IMPRESSION: No acute intracranial abnormality. Prior minimal subdural hematoma along the falx and left tentorium has resolved. Small vessel ischemic changes. Electronically Signed   By: Charline Bills M.D.   On: 09/26/2022 09:22   DG Chest Portable 1 View  Result Date: 09/26/2022 CLINICAL DATA:  Fatigue. EXAM: PORTABLE CHEST 1 VIEW COMPARISON:  09/22/2022 FINDINGS: Heart size appears within normal limits. Status post median sternotomy and CABG procedure. No pleural effusion or edema. Pulmonary vascular congestion. Unchanged appearance right posterior seventh and eighth rib fractures. IMPRESSION: 1. Pulmonary vascular congestion. 2. Unchanged right posterior seventh and eighth rib fractures. Electronically Signed   By: Signa Kell M.D.   On: 09/26/2022 08:24   DG Ribs Unilateral W/Chest Right  Result Date: 09/22/2022 CLINICAL DATA:  fall /pain EXAM: RIGHT RIBS AND CHEST - 3+ VIEW COMPARISON:  Chest x-ray 09/03/2022, CT chest 08/21/2022 FINDINGS: The heart and mediastinal contours are unchanged. Aortic calcification. No focal consolidation. Chronic coarsened interstitial markings. No pleural effusion. No pneumothorax. Likely chronic nondisplaced lateral of eighth rib fracture. Likely chronic nondisplaced lateral seventh rib fracture. Age-indeterminate, likely acute,  displaced posterior seventh and eighth rib fractures. Sternotomy wires are intact. Tips noted.  Vascular coiling noted in the left upper quadrant. IMPRESSION: 1. No acute cardiopulmonary abnormality. 2. Age-indeterminate, likely acute, displaced posterior seventh and eighth rib fractures. Possibly chronic nondisplaced lateral seventh and eighth rib fractures. Correlate with point tenderness to palpation to evaluate for an acute component. Electronically Signed   By: Tish Frederickson M.D.   On: 09/22/2022 19:15   DG Humerus Right  Result Date: 09/22/2022 CLINICAL DATA:  fall /pain EXAM: RIGHT HUMERUS - 2+ VIEW COMPARISON:  None Available. FINDINGS: There is no evidence of fracture or other focal bone lesions. Elbow grossly unremarkable. Please see separately dictated x-ray right shoulder 09/22/2022. Mild subcutaneus soft tissue edema of the proximal arm. IMPRESSION: No acute displaced fracture or dislocation. Electronically Signed   By: Tish Frederickson M.D.   On: 09/22/2022 19:09   DG Shoulder Right  Result Date: 09/22/2022 CLINICAL DATA:  fall /pain EXAM: RIGHT SHOULDER - 2+ VIEW COMPARISON:  None Available. FINDINGS: There is no evidence of fracture or dislocation. At least moderate degenerative changes of the right glenohumeral and acromioclavicular joints. Soft tissues are unremarkable. Question cortical irregularity of the right mid CP x-ray chest and right ribs 09/22/2022.  IMPRESSION: No acute displaced fracture or dislocation. Electronically Signed   By: Tish Frederickson M.D.   On: 09/22/2022 19:06    Microbiology: Results for orders placed or performed during the hospital encounter of 10/12/22  Urine Culture (for pregnant, neutropenic or urologic patients or patients with an indwelling urinary catheter)     Status: Abnormal   Collection Time: 10/13/22  8:02 AM   Specimen: Urine, Clean Catch  Result Value Ref Range Status   Specimen Description   Final    URINE, CLEAN CATCH Performed at [facility], [address] [place], [place] [address]    Special Requests   Final    Immunocompromised Performed at [facility], [address] [place], [place] [address]    Culture (A)  Final    40,000 COLONIES/mL STAPHYLOCOCCUS HAEMOLYTICUS 40,000 COLONIES/mL ENTEROCOCCUS FAECIUM    Report Status 10/15/2022 FINAL  Final   Organism ID, Bacteria ENTEROCOCCUS FAECIUM (A)  Final   Organism ID, Bacteria STAPHYLOCOCCUS HAEMOLYTICUS (A)  Final      Susceptibility   Enterococcus faecium - MIC*    AMPICILLIN >=32 RESISTANT Resistant     NITROFURANTOIN 32 SENSITIVE Sensitive     VANCOMYCIN <=0.5 SENSITIVE Sensitive     * 40,000 COLONIES/mL ENTEROCOCCUS FAECIUM   Staphylococcus haemolyticus - MIC*    CIPROFLOXACIN >=8 RESISTANT Resistant     GENTAMICIN >=16 RESISTANT Resistant     NITROFURANTOIN <=16 SENSITIVE Sensitive     OXACILLIN >=4 RESISTANT Resistant     TETRACYCLINE 2 SENSITIVE Sensitive     VANCOMYCIN 1 SENSITIVE Sensitive     TRIMETH/SULFA >=320 RESISTANT Resistant     CLINDAMYCIN >=8 RESISTANT Resistant     RIFAMPIN >=32 RESISTANT Resistant     Inducible Clindamycin NEGATIVE Sensitive     * 40,000 COLONIES/mL STAPHYLOCOCCUS HAEMOLYTICUS  Culture, blood (Routine X 2) w Reflex to ID Panel     Status: None   Collection Time: 10/13/22  8:56 AM   Specimen: BLOOD LEFT ARM  Result Value Ref Range Status   Specimen Description   Final    BLOOD LEFT ARM Performed at [facility], [address], [place], [place] [address]    Special Requests   Final    BOTTLES DRAWN AEROBIC AND ANAEROBIC Blood Culture adequate volume Performed at [facility], [address] [place], [place] [address]    Culture   Final    NO GROWTH 5 DAYS Performed at [facility], [address], [place], [place] [address]    Report Status 10/18/2022 FINAL  Final  Culture, blood (Routine X 2) w Reflex to ID Panel     Status:  None   Collection Time: 10/13/22  8:56 AM   Specimen: BLOOD RIGHT ARM  Result Value Ref Range Status   Specimen Description   Final    BLOOD RIGHT ARM Performed at [facility], [address], [place], [place] [address]    Special Requests   Final    BOTTLES DRAWN AEROBIC AND ANAEROBIC Blood Culture adequate volume Performed at [facility], [address] [place], [place] [address]    Culture   Final    NO GROWTH 5 DAYS Performed at [facility], [address] [place], [place] [address]    Report Status 10/18/2022 FINAL  Final   *Note: Due to a large number of results and/or encounters for the requested time period, some results  have not been displayed. A complete set of results can be found in Results Review.    Labs: CBC: Recent Labs  Lab 10/14/22 0420 10/15/22 0422 10/16/22 0357 10/17/22 0356 10/18/22 0359  WBC 3.5* 4.0 3.9* 3.5* 3.3*  HGB 11.1* 11.8* 11.0* 10.6* 10.4*  HCT 33.6* 35.3* 34.6* 33.3* 32.3*  MCV 94.4 94.9 96.4 96.2 95.6  PLT 99* 97* 100* 106* 101*   Basic Metabolic Panel: Recent Labs  Lab 10/14/22 0420 10/15/22 0422 10/16/22 0357 10/17/22 0356 10/18/22 0359  NA 137 133* 134* 135 136  K 2.8* 4.1 4.9 4.1 3.9  CL 103 102 106 106 107  CO2 27 24 24 23 24   GLUCOSE 303* 216* 255* 177* 186*  BUN 16 16 16 15 17   CREATININE 0.77 0.65 0.72 0.80 0.72  CALCIUM 9.1 9.4 9.5 9.1 9.1  MG 1.8 1.8 1.9  --   --    Liver Function Tests: No results for input(s): "AST", "ALT", "ALKPHOS", "BILITOT", "PROT", "ALBUMIN" in the last 168 hours. CBG: Recent Labs  Lab 10/17/22 0741 10/17/22 1147 10/17/22 1635 10/17/22 2116 10/18/22 0757  GLUCAP 141* 222* 250* 283* 150*    Discharge time spent: greater than 30 minutes.  Signed: Meredeth Ide, MD [facility] 10/18/2022

## 2022-10-18 NOTE — TOC Transition Note (Signed)
Transition of Care [facility]) - CM/SW Discharge Note   Patient Details  Name: [name] MRN: [id number] Date of Birth: 1946-02-15  [facility] CM/SW Contact:  Otelia Santee, LCSW Phone Number: 10/18/2022, 10:30 AM   Clinical Narrative:    [facility] able to extend bed offer. Pt son agreeable to pt transferring to [facility] and pt is able to transfer today. Pt will be going to room 202. RN to call report to [phone]. Pt will be transferred to facility via [facility]. DC packet made and placed at RN station [facility] called at 11:50am for transportation.    Final next level of care: Skilled Nursing Facility Barriers to Discharge: Barriers Resolved   Patient Goals and CMS Choice CMS Medicare.gov Compare Post Acute Care list provided to:: Patient Represenative (must comment) Georga Stys (son)) Choice offered to / list presented to : Adult Children  Discharge Placement PASRR number recieved: 10/14/22 PASRR number recieved: 10/14/22            Patient chooses bed at: [facility], [place] Patient to be transferred to facility by: [facility] Name of family member notified: Son Patient and family notified of of transfer: 10/18/22  Discharge Plan and Services Additional resources added to the After Visit Summary for   In-house Referral: Clinical Social Work   Post Acute Care Choice: Skilled Nursing Facility          DME Arranged: N/A DME Agency: NA                  Social Determinants of Health (SDOH) Interventions SDOH Screenings   Food Insecurity: No Food Insecurity (10/13/2022)  Housing: Low Risk  (10/13/2022)  Transportation Needs: No Transportation Needs (10/13/2022)  Utilities: Not At Risk (10/13/2022)  Alcohol Screen: Low Risk  (06/04/2018)  Depression (PHQ2-9): Low Risk  (08/15/2022)  Financial Resource Strain: Low Risk  (05/29/2017)  Physical Activity: Inactive (05/29/2017)  Social Connections: Moderately Integrated (05/29/2017)  Stress: No Stress Concern Present  (05/29/2017)  Tobacco Use: Low Risk  (10/12/2022)     Readmission Risk Interventions    10/18/2022   10:27 AM 07/03/2022   12:13 PM  Readmission Risk Prevention Plan  Transportation Screening Complete Complete  Medication Review (RN Care Manager) Complete Not Complete  PCP or Specialist appointment within 3-5 days of discharge Complete Complete  HRI or Home Care Consult Complete   SW Recovery Care/Counseling Consult Complete Complete  Palliative Care Screening Not Applicable Not Complete  Comments  patient will go to skilled nursing  Skilled Nursing Facility Complete Complete

## 2022-10-19 ENCOUNTER — Ambulatory Visit: Payer: Self-pay

## 2022-10-19 ENCOUNTER — Ambulatory Visit: Payer: Medicare Other | Admitting: Family Medicine

## 2022-10-19 DIAGNOSIS — L89152 Pressure ulcer of sacral region, stage 2: Secondary | ICD-10-CM | POA: Diagnosis not present

## 2022-10-19 NOTE — Patient Instructions (Signed)
Visit Information  Thank you for taking time to visit with me today. Please don't hesitate to contact me if I can be of assistance to you.   Following are the goals we discussed today:  - Speak with the social worker and/or discharge planner at [facility] to discuss longterm care planning  Our next appointment is by telephone on 6/21 at 11:30 am  Please call the care guide team at [phone] if you need to cancel or reschedule your appointment.   If you are experiencing a Mental Health or Behavioral Health Crisis or need someone to talk to, please call 1-800-273-TALK (toll free, 24 hour hotline) go to [facility] [address] [place] [phone]) call 911  Patient verbalizes understanding of instructions and care plan provided today and agrees to view in MyChart. Active MyChart status and patient understanding of how to access instructions and care plan via MyChart confirmed with patient.     [name], BSW, CDP Social Worker, Certified Dementia Practitioner [facility] Care Management  Care Coordination [phone]

## 2022-10-19 NOTE — Consult Note (Signed)
   [facility] Inpatient Consult   10/19/2022  Julyanna Scholle 27-Jan-1946 [id number]  [facility] [[facility]]  Accountable Care Organization [ACO] Patient: [name] Medicare  Primary Care Provider:  Frederica Kuster, MD  *Remote review for patient at [facility] active with [facility] BSW and [facility] RN CC prior to admission.  Patient was reviewed for readmission extreme high risk score 5 day length of stay barriers to care for community needs.  Patient was screened for hospitalization and to assess for post hospital [facility] Coordination needs.  Patient is being considered for a skilled nursing facility level of care for post hospital transition.  Plan:  To notify [facility] Care Coordination team and as well as  [facility] Customer service manager [[facility]] Care Management PAC RN with traditional Medicare and approved Medicare Advantage plans of patient transition to SNF and ongoing follow up needs.   Plan:   Will notify the [facility] RN can follow for any known or needs for transitional care needs for returning to post facility care coordination needs for patient to return to community. Son noted as contact person.  For questions or referrals, please contact:   Charlesetta Shanks, RN BSN CCM [facility]  [phone] business mobile phone Toll free office [phone]  *Concierge Line  [phone] Fax number: [phone] [name]

## 2022-10-19 NOTE — Patient Outreach (Signed)
  Care Coordination   Follow Up Visit Note   10/19/2022 Name: [name] MRN: [id number] DOB: Jan 11, 1946  [name] is a 77 y.o. year old female who sees Frederica Kuster, MD for primary care. I  spoke with patients son [name] to follow up on patients care coordination needs.  What matters to the patients health and wellness today?  Patient has recently transitioned to short-term rehab. The family wants her to work to meet therapy goals.    Goals Addressed             This Visit's Progress    Care Coordination Activities       Care Coordination Interventions: Collaboration with hospital liaison who advises patient has discharged to [facility] for rehab Contacted the patients son [name] who reports the family is unsure if patient will be able to return home or if she will need long term care Advised [name] to speak with the social worker/discharge planner at [facility] this week if long term care is desired in order to begin the application for long term care Discussed SW will follow up with the family on patients progress over the next 14 days Collaboration with RN Care Manager to advise of interventions and plan         SDOH assessments and interventions completed:  No     Care Coordination Interventions:  Yes, provided   Interventions Today    Flowsheet Row Most Recent Value  Chronic Disease   Chronic disease during today's visit Hypertension (HTN), Atrial Fibrillation (AFib)  General Interventions   General Interventions Discussed/Reviewed General Interventions Reviewed, Level of Care, Communication with  Communication with RN  Level of Care Skilled Nursing Facility, Assisted Living  [Discussed pt is in rehab now and may want to transition to LTC]        Follow up plan: Follow up call scheduled for 6/21    Encounter Outcome:  Pt. Visit Completed   [name], Kenard Gower, CDP Social Worker, Certified Dementia Practitioner [facility] Care  Management  Care Coordination [phone]

## 2022-10-21 ENCOUNTER — Ambulatory Visit: Payer: Self-pay

## 2022-10-21 ENCOUNTER — Other Ambulatory Visit: Payer: Self-pay | Admitting: *Deleted

## 2022-10-21 DIAGNOSIS — K7460 Unspecified cirrhosis of liver: Secondary | ICD-10-CM | POA: Diagnosis not present

## 2022-10-21 DIAGNOSIS — E46 Unspecified protein-calorie malnutrition: Secondary | ICD-10-CM | POA: Diagnosis not present

## 2022-10-21 DIAGNOSIS — N390 Urinary tract infection, site not specified: Secondary | ICD-10-CM | POA: Diagnosis not present

## 2022-10-21 DIAGNOSIS — E1151 Type 2 diabetes mellitus with diabetic peripheral angiopathy without gangrene: Secondary | ICD-10-CM | POA: Diagnosis not present

## 2022-10-21 DIAGNOSIS — K7581 Nonalcoholic steatohepatitis (NASH): Secondary | ICD-10-CM | POA: Diagnosis not present

## 2022-10-21 NOTE — Patient Outreach (Signed)
Mrs. [name] resides in [place] skilled nursing facility. Mrs. [name] is active with [facility] care coordination team as benefit of health plan and PCP.   Collaboration with [facility] care coordination team and Jill Side, [facility] social worker. Jill Side reports she has spoke to the son about LTC/Medicaid. States care plan meeting is scheduled for next week. Anticipated transition plan is LTC.   Will continue to follow.    Raiford Noble, MSN, RN,BSN [facility] Post Acute Care Coordinator [phone] (Direct dial)

## 2022-10-21 NOTE — Patient Outreach (Signed)
  Care Coordination   10/21/2022 Name: Laaibah Hovell MRN: [id number] DOB: 1945-08-15   Care Coordination Outreach Attempts:  An unsuccessful telephone outreach was attempted for a scheduled appointment today.  Follow Up Plan:  Additional outreach attempts will be made to offer the patient care coordination information and services.   Encounter Outcome:  No Answer   Care Coordination Interventions:  No, not indicated    Delsa Sale, RN, BSN, CCM Care Management Coordinator [facility] Care Management  Direct Phone: [phone]

## 2022-10-26 DIAGNOSIS — L89152 Pressure ulcer of sacral region, stage 2: Secondary | ICD-10-CM | POA: Diagnosis not present

## 2022-10-28 ENCOUNTER — Ambulatory Visit: Payer: Self-pay

## 2022-10-28 ENCOUNTER — Ambulatory Visit: Payer: Medicare Other

## 2022-10-28 NOTE — Patient Outreach (Signed)
  Care Coordination   Follow Up Visit Note   10/28/2022 Name: [name] MRN: [id number] DOB: [birth year]  [name] is a 77 y.o. year old female who sees Frederica Kuster, MD for primary care. I  spoke with patients son [name] by phone to follow up on care coordination needs.  What matters to the patients health and wellness today?  Patient remains in rehab and is doing well at this time.    Goals Addressed             This Visit's Progress    COMPLETED: Care Coordination Activities       Care Coordination Interventions: Discussed the patient remains in rehab at [facility] and is doing well. [name] has spoken with the discharge planner and does plan to complete paperwork to transition the patient to LTC Medicaid and remain at [facility] long term Collaboration with RN Care Manager to advise of plan for patient to remain in long term care        SDOH assessments and interventions completed:  No     Care Coordination Interventions:  Yes, provided   Interventions Today    Flowsheet Row Most Recent Value  Chronic Disease   Chronic disease during today's visit Hypertension (HTN), Atrial Fibrillation (AFib)  General Interventions   General Interventions Discussed/Reviewed General Interventions Reviewed, Level of Care  [Discussed the patient remain in rehab at [facility] with intention of transitioning to LTC at [facility] once she is finished with rehab and paperwork is processed]        Follow up plan: No further intervention required.   Encounter Outcome:  Pt. Visit Completed   [name], BSW, CDP Social Worker, Certified Dementia Practitioner [facility] Care Management  Care Coordination [phone]

## 2022-10-28 NOTE — Patient Instructions (Signed)
Visit Information  Thank you for taking time to visit with me today. Please don't hesitate to contact me if I can be of assistance to you.   Following are the goals we discussed today:  - Remain engaged with [facility] to transition to long term care -Contact me as needed   If you are experiencing a Mental Health or Behavioral Health Crisis or need someone to talk to, please call 911  Patient verbalizes understanding of instructions and care plan provided today and agrees to view in MyChart. Active MyChart status and patient understanding of how to access instructions and care plan via MyChart confirmed with patient.     No further follow up required: Please contact me as needed  [name], BSW, CDP Social Worker, Certified Dementia Practitioner [facility] Care Management  Care Coordination [phone]

## 2022-11-04 ENCOUNTER — Telehealth: Payer: Self-pay | Admitting: Hematology

## 2022-11-04 ENCOUNTER — Ambulatory Visit: Payer: Self-pay

## 2022-11-04 NOTE — Patient Instructions (Signed)
Visit Information  Thank you for taking time to visit with me today. Please don't hesitate to contact me if I can be of assistance to you.   Following are the goals we discussed today:   Goals Addressed             This Visit's Progress    To stay healthy and out of the hospital       Care Coordination Interventions: Completed successful outbound call with son [name] Determined patient remains to be inpatient at [facility] for acute rehab Discussed [name] has started the [facility] application for long term care, however the patient is stating she will not stay long term Discussed with [name], his father is also inpatient at [facility] following a recent hospitalization and is residing in the same room with his mother  Educated son [name] about [facility] and [facility] to help establish a long term care plan for patient if she chose's to return to her home  Encouraged [name] to discuss PACE further with [name] BSW to learn more about this services Educated son [name] regarding consideration of having his mother evaluated by a Urogynecologist due to frequency of UTI's and [facility] did not make appropriate recommendations, advised PCP may consider and place referral  Collaborated with BSW [name] to discuss patient status and request additional follow up with son [name]         Our next appointment is by telephone on 12/05/22 at 09:30 AM  Please call the care guide team at [phone] if you need to cancel or reschedule your appointment.   If you are experiencing a Mental Health or Behavioral Health Crisis or need someone to talk to, please call 1-800-273-TALK (toll free, 24 hour hotline)  Patient verbalizes understanding of instructions and care plan provided today and agrees to view in MyChart. Active MyChart status and patient understanding of how to access instructions and care plan via MyChart confirmed with patient.     Delsa Sale, RN, BSN, CCM Care  Management Coordinator [facility] Care Management  Direct Phone: [phone]

## 2022-11-04 NOTE — Patient Outreach (Signed)
  Care Coordination   Follow Up Visit Note   11/04/2022 Name: [name] MRN: [id number] DOB: 04/14/1946  [name] is a 77 y.o. year old female who sees Frederica Kuster, MD for primary care. I spoke with son [name] by phone today.  What matters to the patients health and wellness today?  [name] is working on applying for [employer] for his mother to stay at [facility] long term.     Goals Addressed             This Visit's Progress    To stay healthy and out of the hospital       Care Coordination Interventions: Completed successful outbound call with son [name] Determined patient remains to be inpatient at [facility] for acute rehab Discussed [name] has started the [facility] application for long term care, however the patient is stating she will not stay long term Discussed with [name], his father is also inpatient at [facility] following a recent hospitalization and is residing in the same room with his mother  Educated son [name] about [facility] and [facility] to help establish a long term care plan for patient if she chose's to return to her home  Encouraged [name] to discuss PACE further with [name] BSW to learn more about this services Educated son [name] regarding consideration of having his mother evaluated by a Urogynecologist due to frequency of UTI's and [facility] did not make appropriate recommendations, advised PCP may consider and place referral  Collaborated with BSW [name] to discuss patient status and request additional follow up with son [name]     Interventions Today    Flowsheet Row Most Recent Value  Chronic Disease   Chronic disease during today's visit Diabetes, Other  [uti]  General Interventions   General Interventions Discussed/Reviewed General Interventions Discussed, General Interventions Reviewed, Communication with  Doctor Visits Discussed/Reviewed Doctor Visits Discussed, Doctor Visits  Reviewed, PCP, Specialist  Communication with Social Work  Enrique Sack [place]]  Level of Care Skilled Nursing Facility, Assisted Living  Education Interventions   Education Provided Provided Education  Provided Verbal Education On [employer], When to see the doctor, Mental Health/Coping with Illness, Medication  Pharmacy Interventions   Pharmacy Dicussed/Reviewed Pharmacy Topics Discussed, Pharmacy Topics Reviewed, Medications and their functions  Safety Interventions   Safety Discussed/Reviewed Safety Discussed, Safety Reviewed, Home Safety          SDOH assessments and interventions completed:  No     Care Coordination Interventions:  Yes, provided   Follow up plan: Follow up call scheduled for 12/05/22 @09 :30 AM    Encounter Outcome:  Pt. Visit Completed

## 2022-11-04 NOTE — Telephone Encounter (Signed)
Contacted patient to scheduled appointments. Left message with appointment details and a call back number if patient had any questions or could not accommodate the time we provided.   

## 2022-11-07 ENCOUNTER — Ambulatory Visit: Payer: Self-pay

## 2022-11-07 NOTE — Patient Outreach (Signed)
  Care Coordination   Follow Up Visit Note   11/07/2022 Name: Noon Sipp MRN: [id number] DOB: 1945/06/08  Levora Dredge Colone is a 77 y.o. year old female who sees Frederica Kuster, MD for primary care. I  spoke with patients son [name] by phone to follow up on care coordination needs.  What matters to the patients health and wellness today?  Identify a plan for the patient to avoid readmission   Goals Addressed             This Visit's Progress    Care Coordination Activities       Care Coordination Interventions: Collaboration with RN Care Manager who advises the patient is still wanting to return home. RN Care Manager requests SW outreach the patients son to provided education on available resources should the patient return home Education provided to [place] on [facility], [facility], and private duty caregiver options Discussed [name] feels the patient is safest in LTC and feels she is in an ideal location. Reviewed both the patient and her husband have been offered LTC beds in the same room which will allow them to be together Discussed [name] plans to continue discussing options with his mother in hopes of her agreeing to stay at [facility] as he feels this is the best option for her Encouraged [name] to contact SW if additional resource education is needed in the future         SDOH assessments and interventions completed:  No     Care Coordination Interventions:  Yes, provided   Interventions Today    Flowsheet Row Most Recent Value  Chronic Disease   Chronic disease during today's visit Diabetes, Other  General Interventions   General Interventions Discussed/Reviewed General Interventions Reviewed, Communication with, Level of Care  [provided education on level of care options and resources should the patient return home]  Communication with RN  Education Interventions   Education Provided Provided Education  Provided Verbal Education On Community  Resources        Follow up plan: No further intervention required.   Encounter Outcome:  Pt. Visit Completed   [name], BSW, CDP Social Worker, Certified Dementia Practitioner [facility] Care Management  Care Coordination [phone]

## 2022-11-07 NOTE — Patient Instructions (Signed)
Visit Information  Thank you for taking time to visit with me today. Please don't hesitate to contact me if I can be of assistance to you.   Following are the goals we discussed today:  - Continue discussing options for longterm care related to placement or returning home -Contact me as needed   If you are experiencing a Mental Health or Behavioral Health Crisis or need someone to talk to, please call 1-800-273-TALK (toll free, 24 hour hotline) go to [facility] [address] [place] [phone])  Patient verbalizes understanding of instructions and care plan provided today and agrees to view in MyChart. Active MyChart status and patient understanding of how to access instructions and care plan via MyChart confirmed with patient.     [name], BSW, CDP Social Worker, Certified Dementia Practitioner [facility] Care Management  Care Coordination [phone]

## 2022-11-09 ENCOUNTER — Inpatient Hospital Stay: Payer: Medicare Other

## 2022-11-09 ENCOUNTER — Inpatient Hospital Stay: Payer: Medicare Other | Admitting: Hematology

## 2022-11-12 DIAGNOSIS — K7460 Unspecified cirrhosis of liver: Secondary | ICD-10-CM | POA: Diagnosis not present

## 2022-11-12 DIAGNOSIS — R600 Localized edema: Secondary | ICD-10-CM | POA: Diagnosis not present

## 2022-11-12 DIAGNOSIS — M542 Cervicalgia: Secondary | ICD-10-CM | POA: Diagnosis not present

## 2022-11-12 DIAGNOSIS — E46 Unspecified protein-calorie malnutrition: Secondary | ICD-10-CM | POA: Diagnosis not present

## 2022-11-12 DIAGNOSIS — K7581 Nonalcoholic steatohepatitis (NASH): Secondary | ICD-10-CM | POA: Diagnosis not present

## 2022-11-18 ENCOUNTER — Telehealth: Payer: Self-pay

## 2022-11-18 DIAGNOSIS — Z79899 Other long term (current) drug therapy: Secondary | ICD-10-CM | POA: Diagnosis not present

## 2022-11-18 NOTE — Telephone Encounter (Signed)
Patient is in long term care facility

## 2022-11-23 ENCOUNTER — Other Ambulatory Visit: Payer: Self-pay | Admitting: *Deleted

## 2022-11-23 NOTE — Patient Outreach (Addendum)
Post-Acute Care Coordinator follow up. Mrs. Schmiesing resides in [facility] skilled nursing facility. Mrs. Duley was active with [facility] Care Coordination team prior.   Update received from Jill Side, Programmer, systems. Mrs. Davia has transitioned to LTC.   No identifiable post-acute care coordination needs at this time.   Raiford Noble, MSN, RN,BSN [facility] Post Acute Care Coordinator [phone] (Direct dial)

## 2022-11-25 ENCOUNTER — Other Ambulatory Visit: Payer: Self-pay

## 2022-11-25 DIAGNOSIS — I8500 Esophageal varices without bleeding: Secondary | ICD-10-CM

## 2022-11-27 DIAGNOSIS — D72819 Decreased white blood cell count, unspecified: Secondary | ICD-10-CM | POA: Insufficient documentation

## 2022-11-27 NOTE — Assessment & Plan Note (Deleted)
-  She has mild thrombocytopenia in the past 6 years or longer, platelet around 100, overall stable and mild, no clinical bleeding or easy bruising. -This is likely related to her liver cirrhosis.  I discussed the other etiology, such as folate or B12 deficiency, chronic hepatitis or HIV infection, alcohol related, which were all checked in the past and were WNL or negative  -ITP is also a possibility, not able to rule out.  However giving her mild thrombocytopenia, she would not require any treatment. -Primary bone marrow disease, such as MDS, lymphoma or leukemia much less likely, I did not recommend bone marrow biopsy at this point. -If her platelet counts drops below 50, and she needs invasive surgery, I would offer Doptelet to improve her thrombocytopenia before surgery.

## 2022-11-27 NOTE — Assessment & Plan Note (Deleted)
She is monitored by her PCP, Dr. Hyacinth Meeker, and was found to have decreasing white count over last year -She has history of liver cirrhosis, splenomegaly.  Her lymphopenia is likely related to her liver disease. -She has no frequent infection, no indication for treatment -Overall mild, and stable, will continue monitoring.

## 2022-11-28 ENCOUNTER — Inpatient Hospital Stay: Payer: 59 | Admitting: Hematology

## 2022-11-28 ENCOUNTER — Inpatient Hospital Stay: Payer: 59 | Attending: Hematology

## 2022-11-28 DIAGNOSIS — D72810 Lymphocytopenia: Secondary | ICD-10-CM

## 2022-11-28 DIAGNOSIS — D696 Thrombocytopenia, unspecified: Secondary | ICD-10-CM

## 2022-11-30 NOTE — Progress Notes (Signed)
RICKA, WESTRA ([id number]) [id number] Page 1 of 5 Visit Report for 10/10/2022 HPI Details Patient Name: Date of Service: Chesley Mires 10/10/2022 11:00 A M Medical Record Number: [id number] Patient Account Number: [id number] Date of Birth/Sex: Treating RN: 1946-01-01 (77 y.o. Female) Primary Care Provider: Jacalyn Lefevre Other Clinician: Referring Provider: Treating Provider/Extender: Scherrie Bateman in Treatment: 7 History of Present Illness HPI Description: 08/16/2022 Ms. Holle Sprick is a 77 year old female with a past medical history of cirrhosis of the liver, uncontrolled insulin-dependent type 2 diabetes, severe protein malnutrition that presents the clinic for a left posterior leg wound and skin breakdown to the right buttocks. Daughter is present and helps provide the history. She states that she developed cellulitis to the left leg and subsequently developed a wound. She states this has been present for the past 3 months. She has been treated in the ED for this issue with antibiotics. Currently she is using silver alginate to both wound sites. Daughter states that the patient has been dealing with skin breakdown to the buttocks areas over the past year. They wax and wane in healing. Patient is mostly sedentary. She currently denies signs of infection. 4/23; Patient presents for follow up. Plan was for silver alginate to the wound beds. The right buttocks has healed. T the left posterior leg there was iodoform o laid on top of the wound bed. Patient has home health and this is what they have been doing. Currently patient denies signs of infection. 5/10; patient presents for follow-up. Patient has been using Vashe wet-to-dry dressings to the left posterior leg wound. Again this is not being packed. 6/3; the patient returns to clinic. I am not sure about the reason for the hiatus however the wound on the posterior left calf  however the wound is actually healed today. She has compression stockings Electronic Signature(s) Signed: 10/11/2022 7:23:00 AM By: Baltazar Najjar MD Entered By: Baltazar Najjar on 10/10/2022 11:34:51 -------------------------------------------------------------------------------- Physical Exam Details Patient Name: Date of Service: Levell July C. 10/10/2022 11:00 A M Medical Record Number: [id number] Patient Account Number: [id number] Date of Birth/Sex: Treating RN: 08-Aug-1945 (77 y.o. Female) Primary Care Provider: Jacalyn Lefevre Other Clinician: Referring Provider: Treating Provider/Extender: Scherrie Bateman in Treatment: 7 Notes Wound exam; the area on the posterior left leg is healed. There is good edema control. Electronic Signature(s) Signed: 10/11/2022 7:23:00 AM By: Baltazar Najjar MD Entered By: Baltazar Najjar on 10/10/2022 11:35:13 Physician Orders Details -------------------------------------------------------------------------------- Ollen Bowl ([id number]) [id number] Page 2 of 5 Patient Name: Date of Service: Chesley Mires 10/10/2022 11:00 A M Medical Record Number: [id number] Patient Account Number: [id number] Date of Birth/Sex: Treating RN: [birth year] (77 y.o. 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 Edema Control - Lymphedema / SCD / Other Elevate legs to the level of the heart or above for 30 minutes daily and/or when sitting for 3-4 times  a day throughout the day. Avoid standing for long periods of time. Patient to wear own compression stockings every day. Exercise regularly Moisturize legs daily. Home Health Discontinue home health for wound care. - [facility] Electronic Signature(s) Signed: 10/10/2022 4:55:10 PM By: Shawn Stall RN, BSN Signed: 10/11/2022 7:23:00 AM By: Baltazar Najjar MD Entered By: Shawn Stall on 10/10/2022 11:28:51 -------------------------------------------------------------------------------- Problem List Details Patient Name: Date of Service: Levell July C. 10/10/2022 11:00 A M Medical Record Number: [id number] Patient Account Number: [id number] Date of Birth/Sex: Treating RN: Apr 28, 1946 (77 y.o. Female) Shawn Stall Primary Care Provider: Jacalyn Lefevre Other Clinician: Referring Provider: Treating Provider/Extender: Scherrie Bateman in Treatment: 7 Active Problems ICD-10 Encounter Code Description Active Date MDM Diagnosis [phone] Non-pressure chronic ulcer of other part of left lower leg with muscle 08/16/2022 No Yes involvement without evidence of necrosis E11.622 Type 2 diabetes mellitus with other skin ulcer 08/16/2022 No Yes L89.312 Pressure ulcer of right buttock, stage 2 08/16/2022 No Yes K74.60 Unspecified cirrhosis of liver 08/16/2022 No Yes LENNETTE, FADER ([id number]) [id number] Page 3 of 5 E43 Unspecified severe protein-calorie malnutrition 08/16/2022 No Yes Inactive Problems Resolved Problems Electronic Signature(s) Signed: 10/11/2022 7:23:00 AM By: Baltazar Najjar MD Entered By: Baltazar Najjar on 10/10/2022 11:31:21 -------------------------------------------------------------------------------- Progress Note Details Patient Name: Date of  Service: Ricard Dillon BETH C. 10/10/2022 11:00 A M Medical Record Number: [id number] Patient Account Number: [id number] Date of Birth/Sex: Treating RN: 1945/07/24 (77 y.o. Female) Primary Care Provider: Jacalyn Lefevre Other Clinician: Referring Provider: Treating Provider/Extender: Scherrie Bateman in Treatment: 7 Subjective History of Present Illness (HPI) 08/16/2022 Ms. Cynara Tatham is a 77 year old female with a past medical history of cirrhosis of the liver, uncontrolled insulin-dependent type 2 diabetes, severe protein malnutrition that presents the clinic for a left posterior leg wound and skin breakdown to the right buttocks. Daughter is present and helps provide the history. She states that she developed cellulitis to the left leg and subsequently developed a wound. She states this has been present for the past 3 months. She has been treated in the ED for this issue with antibiotics. Currently she is using silver alginate to both wound sites. Daughter states that the patient has been dealing with skin breakdown to the buttocks areas over the past year. They wax and wane in healing. Patient is mostly sedentary. She currently denies signs of infection. 4/23; Patient presents for follow up. Plan was for silver alginate to the wound beds. The right buttocks has healed. T the left posterior leg there was iodoform o laid on top of the wound bed. Patient has home health and this is what they have been doing. Currently patient denies signs of infection. 5/10; patient presents for follow-up. Patient has been using Vashe wet-to-dry dressings to the left posterior leg wound. Again this is not being packed. 6/3; the patient returns to clinic. I am not sure about the reason for the hiatus however the wound on the posterior left calf however the wound is actually healed today. She has compression stockings Objective Constitutional Vitals Time Taken: 11:24 AM, Height: 63  in, Weight: 120 lbs, BMI: 21.3, Temperature: 98.2 F, Pulse: 78 bpm, Respiratory Rate: 20 breaths/min, Blood Pressure: 175/71 mmHg. Integumentary (Hair, Skin) Wound #1 status is Healed - Flap. Original cause of wound was Bump. The date acquired was: 05/09/2022. The wound has been in treatment 7 weeks. The wound is located on the Left,Posterior Lower  Leg. The wound measures 0cm length x 0cm width x 0cm depth; 0cm^2 area and 0cm^3 volume. There is no tunneling or undermining noted. There is a none present amount of drainage noted. The wound margin is distinct with the outline attached to the wound base. There is no granulation within the wound bed. There is no necrotic tissue within the wound bed. The periwound skin appearance did not exhibit: Callus, Crepitus, Excoriation, Induration, Rash, Scarring, Dry/Scaly, Maceration, Atrophie Blanche, Cyanosis, Ecchymosis, Hemosiderin Staining, Mottled, Pallor, Rubor, Erythema. 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 Edema Control - Lymphedema / SCD / Other: Elevate legs to the level of the heart or above for 30 minutes daily and/or when sitting for 3-4 times a day throughout the day. Avoid standing for long periods of time. Patient to wear own compression stockings every day. Exercise regularly Moisturize legs daily. Home Health: Discontinue home health for wound care. - [facility] 1. The patient wound is healed 2. She is wearing compression stockings 3. Wound apparently started in the  setting of left leg cellulitis which required hospitalization. No additional secondary prevention necessary Electronic Signature(s) Signed: 11/30/2022 10:28:23 AM By: Pearletha Alfred Signed: 11/30/2022 4:14:24 PM By: Baltazar Najjar MD Previous Signature: 10/11/2022 7:23:00 AM Version By: Baltazar Najjar MD Entered By: Pearletha Alfred on 11/30/2022 10:28:23 -------------------------------------------------------------------------------- SuperBill Details Patient Name: Date of Service: Chesley Mires 10/10/2022 Medical Record Number: [id number] Patient Account Number: [id number] Date of Birth/Sex: Treating RN: 1946-03-07 (77 y.o. 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 E11.622 Type 2 diabetes mellitus with other skin ulcer L89.312 Pressure ulcer of right buttock, stage 2 K74.60 Unspecified cirrhosis of liver E43 Unspecified severe protein-calorie malnutrition Facility Procedures : CPT4 Code: [id number] Description: 99213 - WOUND CARE VISIT-LEV 3 EST PT Modifier: Quantity: 1 Physician Procedures : CPT4 Code Description Modifier [id number] - WC PHYS LEVEL 2 - EST PT ICD-10 Diagnosis Description L97.825 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

## 2022-12-05 ENCOUNTER — Ambulatory Visit: Payer: Self-pay

## 2022-12-05 NOTE — Patient Outreach (Signed)
  Care Coordination   Follow Up Visit Note   12/05/2022 Name: [name] MRN: [id number] DOB: 1946-02-13  [name] is a 77 y.o. year old female who sees Frederica Kuster, MD for primary care. I  received an in basket message from [facility] RN advising this patient is remaining in long term care at [facility],  per [name], [employer] [facility] social worker.  What matters to the patients health and wellness today?  N/a    Goals Addressed             This Visit's Progress    COMPLETED: To have Ammonia level rechecked       Care Coordination Interventions: Unable to assess goal outcome due to patient is residing in long term nursing care facility     COMPLETED: To prevent reoccurring UTI's       Care Coordination Interventions: Unable to assess goal outcome due to patient is residing in long term nursing care facility     COMPLETED: To stay healthy and out of the hospital       Care Coordination Interventions: Unable to assess goal outcome due to patient is residing in long term nursing care facility    Interventions Today    Flowsheet Row Most Recent Value  General Interventions   General Interventions Discussed/Reviewed Communication with  Communication with RN  Kayla Noble RN]          SDOH assessments and interventions completed:  No     Care Coordination Interventions:  Yes, provided   Follow up plan: No further intervention required.   Encounter Outcome:  Pt. Visit Completed

## 2022-12-10 DIAGNOSIS — M542 Cervicalgia: Secondary | ICD-10-CM | POA: Diagnosis not present

## 2022-12-10 DIAGNOSIS — G8929 Other chronic pain: Secondary | ICD-10-CM | POA: Diagnosis not present

## 2022-12-10 DIAGNOSIS — E1151 Type 2 diabetes mellitus with diabetic peripheral angiopathy without gangrene: Secondary | ICD-10-CM | POA: Diagnosis not present

## 2022-12-10 DIAGNOSIS — M545 Low back pain, unspecified: Secondary | ICD-10-CM | POA: Diagnosis not present

## 2022-12-12 DIAGNOSIS — M545 Low back pain, unspecified: Secondary | ICD-10-CM | POA: Diagnosis not present

## 2022-12-12 DIAGNOSIS — M542 Cervicalgia: Secondary | ICD-10-CM | POA: Diagnosis not present

## 2022-12-14 ENCOUNTER — Encounter: Payer: Medicare Other | Admitting: Family Medicine

## 2022-12-14 ENCOUNTER — Encounter: Payer: Self-pay | Admitting: Family Medicine

## 2022-12-19 DIAGNOSIS — N390 Urinary tract infection, site not specified: Secondary | ICD-10-CM | POA: Diagnosis not present

## 2022-12-19 DIAGNOSIS — R4182 Altered mental status, unspecified: Secondary | ICD-10-CM | POA: Diagnosis not present

## 2022-12-21 NOTE — Progress Notes (Signed)
This encounter was created in error - please disregard.

## 2022-12-21 NOTE — Progress Notes (Deleted)
error 

## 2022-12-22 DIAGNOSIS — R0602 Shortness of breath: Secondary | ICD-10-CM | POA: Diagnosis not present

## 2022-12-23 DIAGNOSIS — N390 Urinary tract infection, site not specified: Secondary | ICD-10-CM | POA: Diagnosis not present

## 2022-12-23 DIAGNOSIS — G9341 Metabolic encephalopathy: Secondary | ICD-10-CM | POA: Diagnosis not present

## 2022-12-23 DIAGNOSIS — R1313 Dysphagia, pharyngeal phase: Secondary | ICD-10-CM | POA: Diagnosis not present

## 2022-12-23 DIAGNOSIS — S065X0D Traumatic subdural hemorrhage without loss of consciousness, subsequent encounter: Secondary | ICD-10-CM | POA: Diagnosis not present

## 2022-12-23 DIAGNOSIS — R2681 Unsteadiness on feet: Secondary | ICD-10-CM | POA: Diagnosis not present

## 2022-12-23 DIAGNOSIS — I5020 Unspecified systolic (congestive) heart failure: Secondary | ICD-10-CM | POA: Diagnosis not present

## 2022-12-23 DIAGNOSIS — M6281 Muscle weakness (generalized): Secondary | ICD-10-CM | POA: Diagnosis not present

## 2022-12-24 DIAGNOSIS — J189 Pneumonia, unspecified organism: Secondary | ICD-10-CM | POA: Diagnosis not present

## 2022-12-24 DIAGNOSIS — E1151 Type 2 diabetes mellitus with diabetic peripheral angiopathy without gangrene: Secondary | ICD-10-CM | POA: Diagnosis not present

## 2022-12-24 DIAGNOSIS — I5020 Unspecified systolic (congestive) heart failure: Secondary | ICD-10-CM | POA: Diagnosis not present

## 2022-12-24 DIAGNOSIS — R0600 Dyspnea, unspecified: Secondary | ICD-10-CM | POA: Diagnosis not present

## 2022-12-25 DIAGNOSIS — N390 Urinary tract infection, site not specified: Secondary | ICD-10-CM | POA: Diagnosis not present

## 2022-12-25 DIAGNOSIS — G9341 Metabolic encephalopathy: Secondary | ICD-10-CM | POA: Diagnosis not present

## 2022-12-25 DIAGNOSIS — R1313 Dysphagia, pharyngeal phase: Secondary | ICD-10-CM | POA: Diagnosis not present

## 2022-12-25 DIAGNOSIS — M6281 Muscle weakness (generalized): Secondary | ICD-10-CM | POA: Diagnosis not present

## 2022-12-25 DIAGNOSIS — R2681 Unsteadiness on feet: Secondary | ICD-10-CM | POA: Diagnosis not present

## 2022-12-25 DIAGNOSIS — S065X0D Traumatic subdural hemorrhage without loss of consciousness, subsequent encounter: Secondary | ICD-10-CM | POA: Diagnosis not present

## 2022-12-27 DIAGNOSIS — R601 Generalized edema: Secondary | ICD-10-CM | POA: Diagnosis not present

## 2022-12-30 DIAGNOSIS — R1313 Dysphagia, pharyngeal phase: Secondary | ICD-10-CM | POA: Diagnosis not present

## 2022-12-30 DIAGNOSIS — N390 Urinary tract infection, site not specified: Secondary | ICD-10-CM | POA: Diagnosis not present

## 2022-12-30 DIAGNOSIS — R2681 Unsteadiness on feet: Secondary | ICD-10-CM | POA: Diagnosis not present

## 2022-12-30 DIAGNOSIS — M6281 Muscle weakness (generalized): Secondary | ICD-10-CM | POA: Diagnosis not present

## 2022-12-30 DIAGNOSIS — G9341 Metabolic encephalopathy: Secondary | ICD-10-CM | POA: Diagnosis not present

## 2022-12-30 DIAGNOSIS — S065X0D Traumatic subdural hemorrhage without loss of consciousness, subsequent encounter: Secondary | ICD-10-CM | POA: Diagnosis not present

## 2023-01-02 DIAGNOSIS — R4182 Altered mental status, unspecified: Secondary | ICD-10-CM | POA: Diagnosis not present

## 2023-01-02 DIAGNOSIS — E119 Type 2 diabetes mellitus without complications: Secondary | ICD-10-CM | POA: Diagnosis not present

## 2023-01-02 DIAGNOSIS — I5020 Unspecified systolic (congestive) heart failure: Secondary | ICD-10-CM | POA: Diagnosis not present

## 2023-01-03 DIAGNOSIS — M6281 Muscle weakness (generalized): Secondary | ICD-10-CM | POA: Diagnosis not present

## 2023-01-03 DIAGNOSIS — S065X0D Traumatic subdural hemorrhage without loss of consciousness, subsequent encounter: Secondary | ICD-10-CM | POA: Diagnosis not present

## 2023-01-03 DIAGNOSIS — R2681 Unsteadiness on feet: Secondary | ICD-10-CM | POA: Diagnosis not present

## 2023-01-03 DIAGNOSIS — G9341 Metabolic encephalopathy: Secondary | ICD-10-CM | POA: Diagnosis not present

## 2023-01-03 DIAGNOSIS — N390 Urinary tract infection, site not specified: Secondary | ICD-10-CM | POA: Diagnosis not present

## 2023-01-03 DIAGNOSIS — R1313 Dysphagia, pharyngeal phase: Secondary | ICD-10-CM | POA: Diagnosis not present

## 2023-01-04 DIAGNOSIS — S065X0D Traumatic subdural hemorrhage without loss of consciousness, subsequent encounter: Secondary | ICD-10-CM | POA: Diagnosis not present

## 2023-01-04 DIAGNOSIS — R1313 Dysphagia, pharyngeal phase: Secondary | ICD-10-CM | POA: Diagnosis not present

## 2023-01-04 DIAGNOSIS — M6281 Muscle weakness (generalized): Secondary | ICD-10-CM | POA: Diagnosis not present

## 2023-01-04 DIAGNOSIS — I10 Essential (primary) hypertension: Secondary | ICD-10-CM | POA: Diagnosis not present

## 2023-01-04 DIAGNOSIS — G9341 Metabolic encephalopathy: Secondary | ICD-10-CM | POA: Diagnosis not present

## 2023-01-04 DIAGNOSIS — R2681 Unsteadiness on feet: Secondary | ICD-10-CM | POA: Diagnosis not present

## 2023-01-04 DIAGNOSIS — N390 Urinary tract infection, site not specified: Secondary | ICD-10-CM | POA: Diagnosis not present

## 2023-01-05 DIAGNOSIS — M6281 Muscle weakness (generalized): Secondary | ICD-10-CM | POA: Diagnosis not present

## 2023-01-05 DIAGNOSIS — N390 Urinary tract infection, site not specified: Secondary | ICD-10-CM | POA: Diagnosis not present

## 2023-01-05 DIAGNOSIS — R2681 Unsteadiness on feet: Secondary | ICD-10-CM | POA: Diagnosis not present

## 2023-01-05 DIAGNOSIS — G9341 Metabolic encephalopathy: Secondary | ICD-10-CM | POA: Diagnosis not present

## 2023-01-05 DIAGNOSIS — R1313 Dysphagia, pharyngeal phase: Secondary | ICD-10-CM | POA: Diagnosis not present

## 2023-01-05 DIAGNOSIS — S065X0D Traumatic subdural hemorrhage without loss of consciousness, subsequent encounter: Secondary | ICD-10-CM | POA: Diagnosis not present

## 2023-01-06 DIAGNOSIS — S065X0D Traumatic subdural hemorrhage without loss of consciousness, subsequent encounter: Secondary | ICD-10-CM | POA: Diagnosis not present

## 2023-01-06 DIAGNOSIS — R2681 Unsteadiness on feet: Secondary | ICD-10-CM | POA: Diagnosis not present

## 2023-01-06 DIAGNOSIS — N390 Urinary tract infection, site not specified: Secondary | ICD-10-CM | POA: Diagnosis not present

## 2023-01-06 DIAGNOSIS — G9341 Metabolic encephalopathy: Secondary | ICD-10-CM | POA: Diagnosis not present

## 2023-01-06 DIAGNOSIS — M6281 Muscle weakness (generalized): Secondary | ICD-10-CM | POA: Diagnosis not present

## 2023-01-06 DIAGNOSIS — R1313 Dysphagia, pharyngeal phase: Secondary | ICD-10-CM | POA: Diagnosis not present

## 2023-01-09 DIAGNOSIS — N390 Urinary tract infection, site not specified: Secondary | ICD-10-CM | POA: Diagnosis not present

## 2023-01-09 DIAGNOSIS — R1313 Dysphagia, pharyngeal phase: Secondary | ICD-10-CM | POA: Diagnosis not present

## 2023-01-09 DIAGNOSIS — G9341 Metabolic encephalopathy: Secondary | ICD-10-CM | POA: Diagnosis not present

## 2023-01-09 DIAGNOSIS — S065X0D Traumatic subdural hemorrhage without loss of consciousness, subsequent encounter: Secondary | ICD-10-CM | POA: Diagnosis not present

## 2023-01-09 DIAGNOSIS — M6281 Muscle weakness (generalized): Secondary | ICD-10-CM | POA: Diagnosis not present

## 2023-01-09 DIAGNOSIS — R2681 Unsteadiness on feet: Secondary | ICD-10-CM | POA: Diagnosis not present

## 2023-01-10 DIAGNOSIS — R2681 Unsteadiness on feet: Secondary | ICD-10-CM | POA: Diagnosis not present

## 2023-01-10 DIAGNOSIS — N390 Urinary tract infection, site not specified: Secondary | ICD-10-CM | POA: Diagnosis not present

## 2023-01-10 DIAGNOSIS — G9341 Metabolic encephalopathy: Secondary | ICD-10-CM | POA: Diagnosis not present

## 2023-01-10 DIAGNOSIS — R1313 Dysphagia, pharyngeal phase: Secondary | ICD-10-CM | POA: Diagnosis not present

## 2023-01-10 DIAGNOSIS — M6281 Muscle weakness (generalized): Secondary | ICD-10-CM | POA: Diagnosis not present

## 2023-01-10 DIAGNOSIS — S065X0D Traumatic subdural hemorrhage without loss of consciousness, subsequent encounter: Secondary | ICD-10-CM | POA: Diagnosis not present

## 2023-01-11 DIAGNOSIS — L89152 Pressure ulcer of sacral region, stage 2: Secondary | ICD-10-CM | POA: Diagnosis not present

## 2023-01-12 DIAGNOSIS — M6281 Muscle weakness (generalized): Secondary | ICD-10-CM | POA: Diagnosis not present

## 2023-01-12 DIAGNOSIS — G9341 Metabolic encephalopathy: Secondary | ICD-10-CM | POA: Diagnosis not present

## 2023-01-12 DIAGNOSIS — N390 Urinary tract infection, site not specified: Secondary | ICD-10-CM | POA: Diagnosis not present

## 2023-01-12 DIAGNOSIS — R2681 Unsteadiness on feet: Secondary | ICD-10-CM | POA: Diagnosis not present

## 2023-01-12 DIAGNOSIS — R1313 Dysphagia, pharyngeal phase: Secondary | ICD-10-CM | POA: Diagnosis not present

## 2023-01-12 DIAGNOSIS — S065X0D Traumatic subdural hemorrhage without loss of consciousness, subsequent encounter: Secondary | ICD-10-CM | POA: Diagnosis not present

## 2023-01-13 DIAGNOSIS — N390 Urinary tract infection, site not specified: Secondary | ICD-10-CM | POA: Diagnosis not present

## 2023-01-13 DIAGNOSIS — R1313 Dysphagia, pharyngeal phase: Secondary | ICD-10-CM | POA: Diagnosis not present

## 2023-01-13 DIAGNOSIS — M6281 Muscle weakness (generalized): Secondary | ICD-10-CM | POA: Diagnosis not present

## 2023-01-13 DIAGNOSIS — G9341 Metabolic encephalopathy: Secondary | ICD-10-CM | POA: Diagnosis not present

## 2023-01-13 DIAGNOSIS — S065X0D Traumatic subdural hemorrhage without loss of consciousness, subsequent encounter: Secondary | ICD-10-CM | POA: Diagnosis not present

## 2023-01-13 DIAGNOSIS — R2681 Unsteadiness on feet: Secondary | ICD-10-CM | POA: Diagnosis not present

## 2023-01-16 DIAGNOSIS — G9341 Metabolic encephalopathy: Secondary | ICD-10-CM | POA: Diagnosis not present

## 2023-01-16 DIAGNOSIS — N390 Urinary tract infection, site not specified: Secondary | ICD-10-CM | POA: Diagnosis not present

## 2023-01-16 DIAGNOSIS — M6281 Muscle weakness (generalized): Secondary | ICD-10-CM | POA: Diagnosis not present

## 2023-01-16 DIAGNOSIS — R1313 Dysphagia, pharyngeal phase: Secondary | ICD-10-CM | POA: Diagnosis not present

## 2023-01-16 DIAGNOSIS — R2681 Unsteadiness on feet: Secondary | ICD-10-CM | POA: Diagnosis not present

## 2023-01-16 DIAGNOSIS — S065X0D Traumatic subdural hemorrhage without loss of consciousness, subsequent encounter: Secondary | ICD-10-CM | POA: Diagnosis not present

## 2023-01-18 DIAGNOSIS — M6281 Muscle weakness (generalized): Secondary | ICD-10-CM | POA: Diagnosis not present

## 2023-01-18 DIAGNOSIS — N390 Urinary tract infection, site not specified: Secondary | ICD-10-CM | POA: Diagnosis not present

## 2023-01-18 DIAGNOSIS — R1313 Dysphagia, pharyngeal phase: Secondary | ICD-10-CM | POA: Diagnosis not present

## 2023-01-18 DIAGNOSIS — R2681 Unsteadiness on feet: Secondary | ICD-10-CM | POA: Diagnosis not present

## 2023-01-18 DIAGNOSIS — S065X0D Traumatic subdural hemorrhage without loss of consciousness, subsequent encounter: Secondary | ICD-10-CM | POA: Diagnosis not present

## 2023-01-18 DIAGNOSIS — G9341 Metabolic encephalopathy: Secondary | ICD-10-CM | POA: Diagnosis not present

## 2023-01-18 DIAGNOSIS — L89152 Pressure ulcer of sacral region, stage 2: Secondary | ICD-10-CM | POA: Diagnosis not present

## 2023-01-19 ENCOUNTER — Telehealth: Payer: Medicare Other

## 2023-01-19 NOTE — Telephone Encounter (Signed)
Outgoing called placed to patient assistance program to have them D/C sending [name] and pen needles to the office, as patient is now in a facility and no longer in need of supplies.

## 2023-01-20 DIAGNOSIS — S065X0D Traumatic subdural hemorrhage without loss of consciousness, subsequent encounter: Secondary | ICD-10-CM | POA: Diagnosis not present

## 2023-01-20 DIAGNOSIS — G9341 Metabolic encephalopathy: Secondary | ICD-10-CM | POA: Diagnosis not present

## 2023-01-20 DIAGNOSIS — M6281 Muscle weakness (generalized): Secondary | ICD-10-CM | POA: Diagnosis not present

## 2023-01-20 DIAGNOSIS — R2681 Unsteadiness on feet: Secondary | ICD-10-CM | POA: Diagnosis not present

## 2023-01-20 DIAGNOSIS — N390 Urinary tract infection, site not specified: Secondary | ICD-10-CM | POA: Diagnosis not present

## 2023-01-20 DIAGNOSIS — R1313 Dysphagia, pharyngeal phase: Secondary | ICD-10-CM | POA: Diagnosis not present

## 2023-01-23 DIAGNOSIS — N390 Urinary tract infection, site not specified: Secondary | ICD-10-CM | POA: Diagnosis not present

## 2023-01-23 DIAGNOSIS — S065X0D Traumatic subdural hemorrhage without loss of consciousness, subsequent encounter: Secondary | ICD-10-CM | POA: Diagnosis not present

## 2023-01-23 DIAGNOSIS — R2681 Unsteadiness on feet: Secondary | ICD-10-CM | POA: Diagnosis not present

## 2023-01-23 DIAGNOSIS — M6281 Muscle weakness (generalized): Secondary | ICD-10-CM | POA: Diagnosis not present

## 2023-01-23 DIAGNOSIS — R1313 Dysphagia, pharyngeal phase: Secondary | ICD-10-CM | POA: Diagnosis not present

## 2023-01-23 DIAGNOSIS — G9341 Metabolic encephalopathy: Secondary | ICD-10-CM | POA: Diagnosis not present

## 2023-01-25 DIAGNOSIS — N390 Urinary tract infection, site not specified: Secondary | ICD-10-CM | POA: Diagnosis not present

## 2023-01-25 DIAGNOSIS — E119 Type 2 diabetes mellitus without complications: Secondary | ICD-10-CM | POA: Diagnosis not present

## 2023-01-25 DIAGNOSIS — L89152 Pressure ulcer of sacral region, stage 2: Secondary | ICD-10-CM | POA: Diagnosis not present

## 2023-01-25 DIAGNOSIS — I10 Essential (primary) hypertension: Secondary | ICD-10-CM | POA: Diagnosis not present

## 2023-01-25 DIAGNOSIS — I5020 Unspecified systolic (congestive) heart failure: Secondary | ICD-10-CM | POA: Diagnosis not present

## 2023-01-27 DIAGNOSIS — I5020 Unspecified systolic (congestive) heart failure: Secondary | ICD-10-CM | POA: Diagnosis not present

## 2023-01-28 ENCOUNTER — Inpatient Hospital Stay (HOSPITAL_COMMUNITY)
Admission: EM | Admit: 2023-01-28 | Discharge: 2023-02-07 | DRG: 689 | Disposition: A | Discharge status: Skilled Nursing Facility | Payer: Medicare Other | Source: Skilled Nursing Facility | Attending: Family Medicine | Admitting: Family Medicine

## 2023-01-28 ENCOUNTER — Emergency Department (HOSPITAL_COMMUNITY): Payer: Medicare Other

## 2023-01-28 ENCOUNTER — Other Ambulatory Visit: Payer: Self-pay

## 2023-01-28 ENCOUNTER — Encounter (HOSPITAL_COMMUNITY): Payer: Self-pay

## 2023-01-28 DIAGNOSIS — I4819 Other persistent atrial fibrillation: Secondary | ICD-10-CM | POA: Diagnosis not present

## 2023-01-28 DIAGNOSIS — R0989 Other specified symptoms and signs involving the circulatory and respiratory systems: Secondary | ICD-10-CM | POA: Diagnosis not present

## 2023-01-28 DIAGNOSIS — Z981 Arthrodesis status: Secondary | ICD-10-CM

## 2023-01-28 DIAGNOSIS — G9341 Metabolic encephalopathy: Secondary | ICD-10-CM | POA: Diagnosis present

## 2023-01-28 DIAGNOSIS — J90 Pleural effusion, not elsewhere classified: Secondary | ICD-10-CM | POA: Diagnosis not present

## 2023-01-28 DIAGNOSIS — Z9981 Dependence on supplemental oxygen: Secondary | ICD-10-CM

## 2023-01-28 DIAGNOSIS — Z79899 Other long term (current) drug therapy: Secondary | ICD-10-CM

## 2023-01-28 DIAGNOSIS — I8500 Esophageal varices without bleeding: Secondary | ICD-10-CM | POA: Diagnosis not present

## 2023-01-28 DIAGNOSIS — M7989 Other specified soft tissue disorders: Secondary | ICD-10-CM | POA: Insufficient documentation

## 2023-01-28 DIAGNOSIS — R918 Other nonspecific abnormal finding of lung field: Secondary | ICD-10-CM | POA: Diagnosis not present

## 2023-01-28 DIAGNOSIS — N179 Acute kidney failure, unspecified: Secondary | ICD-10-CM | POA: Diagnosis not present

## 2023-01-28 DIAGNOSIS — N3000 Acute cystitis without hematuria: Principal | ICD-10-CM

## 2023-01-28 DIAGNOSIS — Z993 Dependence on wheelchair: Secondary | ICD-10-CM

## 2023-01-28 DIAGNOSIS — E1165 Type 2 diabetes mellitus with hyperglycemia: Secondary | ICD-10-CM | POA: Diagnosis present

## 2023-01-28 DIAGNOSIS — J9611 Chronic respiratory failure with hypoxia: Secondary | ICD-10-CM | POA: Diagnosis not present

## 2023-01-28 DIAGNOSIS — I5033 Acute on chronic diastolic (congestive) heart failure: Secondary | ICD-10-CM | POA: Diagnosis not present

## 2023-01-28 DIAGNOSIS — I130 Hypertensive heart and chronic kidney disease with heart failure and stage 1 through stage 4 chronic kidney disease, or unspecified chronic kidney disease: Secondary | ICD-10-CM | POA: Diagnosis not present

## 2023-01-28 DIAGNOSIS — I429 Cardiomyopathy, unspecified: Secondary | ICD-10-CM | POA: Diagnosis not present

## 2023-01-28 DIAGNOSIS — R0689 Other abnormalities of breathing: Secondary | ICD-10-CM | POA: Diagnosis not present

## 2023-01-28 DIAGNOSIS — I5023 Acute on chronic systolic (congestive) heart failure: Secondary | ICD-10-CM | POA: Diagnosis not present

## 2023-01-28 DIAGNOSIS — Z8616 Personal history of COVID-19: Secondary | ICD-10-CM | POA: Diagnosis not present

## 2023-01-28 DIAGNOSIS — E119 Type 2 diabetes mellitus without complications: Secondary | ICD-10-CM

## 2023-01-28 DIAGNOSIS — Z7401 Bed confinement status: Secondary | ICD-10-CM | POA: Diagnosis not present

## 2023-01-28 DIAGNOSIS — R569 Unspecified convulsions: Secondary | ICD-10-CM | POA: Diagnosis not present

## 2023-01-28 DIAGNOSIS — L89152 Pressure ulcer of sacral region, stage 2: Secondary | ICD-10-CM | POA: Diagnosis not present

## 2023-01-28 DIAGNOSIS — D61818 Other pancytopenia: Secondary | ICD-10-CM | POA: Diagnosis present

## 2023-01-28 DIAGNOSIS — F0150 Vascular dementia without behavioral disturbance: Secondary | ICD-10-CM | POA: Diagnosis present

## 2023-01-28 DIAGNOSIS — R1310 Dysphagia, unspecified: Secondary | ICD-10-CM | POA: Diagnosis present

## 2023-01-28 DIAGNOSIS — G319 Degenerative disease of nervous system, unspecified: Secondary | ICD-10-CM | POA: Diagnosis not present

## 2023-01-28 DIAGNOSIS — I509 Heart failure, unspecified: Secondary | ICD-10-CM | POA: Diagnosis not present

## 2023-01-28 DIAGNOSIS — D696 Thrombocytopenia, unspecified: Secondary | ICD-10-CM | POA: Diagnosis not present

## 2023-01-28 DIAGNOSIS — Z515 Encounter for palliative care: Secondary | ICD-10-CM | POA: Diagnosis not present

## 2023-01-28 DIAGNOSIS — Z7189 Other specified counseling: Secondary | ICD-10-CM | POA: Diagnosis not present

## 2023-01-28 DIAGNOSIS — E161 Other hypoglycemia: Secondary | ICD-10-CM | POA: Diagnosis not present

## 2023-01-28 DIAGNOSIS — F411 Generalized anxiety disorder: Secondary | ICD-10-CM | POA: Diagnosis present

## 2023-01-28 DIAGNOSIS — Z1612 Extended spectrum beta lactamase (ESBL) resistance: Secondary | ICD-10-CM | POA: Diagnosis present

## 2023-01-28 DIAGNOSIS — E875 Hyperkalemia: Secondary | ICD-10-CM | POA: Diagnosis present

## 2023-01-28 DIAGNOSIS — J9601 Acute respiratory failure with hypoxia: Secondary | ICD-10-CM | POA: Diagnosis not present

## 2023-01-28 DIAGNOSIS — E7849 Other hyperlipidemia: Secondary | ICD-10-CM | POA: Diagnosis not present

## 2023-01-28 DIAGNOSIS — E876 Hypokalemia: Secondary | ICD-10-CM | POA: Diagnosis not present

## 2023-01-28 DIAGNOSIS — E162 Hypoglycemia, unspecified: Secondary | ICD-10-CM | POA: Diagnosis not present

## 2023-01-28 DIAGNOSIS — I252 Old myocardial infarction: Secondary | ICD-10-CM

## 2023-01-28 DIAGNOSIS — R471 Dysarthria and anarthria: Secondary | ICD-10-CM | POA: Diagnosis present

## 2023-01-28 DIAGNOSIS — I5021 Acute systolic (congestive) heart failure: Secondary | ICD-10-CM | POA: Diagnosis not present

## 2023-01-28 DIAGNOSIS — I25118 Atherosclerotic heart disease of native coronary artery with other forms of angina pectoris: Secondary | ICD-10-CM | POA: Diagnosis present

## 2023-01-28 DIAGNOSIS — I5043 Acute on chronic combined systolic (congestive) and diastolic (congestive) heart failure: Secondary | ICD-10-CM | POA: Diagnosis present

## 2023-01-28 DIAGNOSIS — N3020 Other chronic cystitis without hematuria: Secondary | ICD-10-CM | POA: Diagnosis present

## 2023-01-28 DIAGNOSIS — K7581 Nonalcoholic steatohepatitis (NASH): Secondary | ICD-10-CM | POA: Diagnosis present

## 2023-01-28 DIAGNOSIS — I959 Hypotension, unspecified: Secondary | ICD-10-CM | POA: Diagnosis not present

## 2023-01-28 DIAGNOSIS — Z91018 Allergy to other foods: Secondary | ICD-10-CM

## 2023-01-28 DIAGNOSIS — B961 Klebsiella pneumoniae [K. pneumoniae] as the cause of diseases classified elsewhere: Secondary | ICD-10-CM | POA: Diagnosis present

## 2023-01-28 DIAGNOSIS — I8510 Secondary esophageal varices without bleeding: Secondary | ICD-10-CM | POA: Diagnosis not present

## 2023-01-28 DIAGNOSIS — J9600 Acute respiratory failure, unspecified whether with hypoxia or hypercapnia: Secondary | ICD-10-CM | POA: Diagnosis not present

## 2023-01-28 DIAGNOSIS — I499 Cardiac arrhythmia, unspecified: Secondary | ICD-10-CM | POA: Diagnosis not present

## 2023-01-28 DIAGNOSIS — I10 Essential (primary) hypertension: Secondary | ICD-10-CM | POA: Diagnosis not present

## 2023-01-28 DIAGNOSIS — E785 Hyperlipidemia, unspecified: Secondary | ICD-10-CM | POA: Diagnosis present

## 2023-01-28 DIAGNOSIS — N3941 Urge incontinence: Secondary | ICD-10-CM | POA: Diagnosis present

## 2023-01-28 DIAGNOSIS — Z8249 Family history of ischemic heart disease and other diseases of the circulatory system: Secondary | ICD-10-CM

## 2023-01-28 DIAGNOSIS — Z90710 Acquired absence of both cervix and uterus: Secondary | ICD-10-CM

## 2023-01-28 DIAGNOSIS — L97229 Non-pressure chronic ulcer of left calf with unspecified severity: Secondary | ICD-10-CM | POA: Diagnosis present

## 2023-01-28 DIAGNOSIS — E1142 Type 2 diabetes mellitus with diabetic polyneuropathy: Secondary | ICD-10-CM | POA: Diagnosis present

## 2023-01-28 DIAGNOSIS — E1122 Type 2 diabetes mellitus with diabetic chronic kidney disease: Secondary | ICD-10-CM | POA: Diagnosis not present

## 2023-01-28 DIAGNOSIS — R410 Disorientation, unspecified: Secondary | ICD-10-CM | POA: Diagnosis not present

## 2023-01-28 DIAGNOSIS — Z974 Presence of external hearing-aid: Secondary | ICD-10-CM

## 2023-01-28 DIAGNOSIS — M810 Age-related osteoporosis without current pathological fracture: Secondary | ICD-10-CM | POA: Diagnosis present

## 2023-01-28 DIAGNOSIS — Z888 Allergy status to other drugs, medicaments and biological substances status: Secondary | ICD-10-CM

## 2023-01-28 DIAGNOSIS — Z887 Allergy status to serum and vaccine status: Secondary | ICD-10-CM

## 2023-01-28 DIAGNOSIS — U071 COVID-19: Secondary | ICD-10-CM | POA: Diagnosis present

## 2023-01-28 DIAGNOSIS — R531 Weakness: Secondary | ICD-10-CM | POA: Diagnosis not present

## 2023-01-28 DIAGNOSIS — R4189 Other symptoms and signs involving cognitive functions and awareness: Secondary | ICD-10-CM | POA: Diagnosis present

## 2023-01-28 DIAGNOSIS — I639 Cerebral infarction, unspecified: Secondary | ICD-10-CM | POA: Diagnosis not present

## 2023-01-28 DIAGNOSIS — I4891 Unspecified atrial fibrillation: Secondary | ICD-10-CM

## 2023-01-28 DIAGNOSIS — Z951 Presence of aortocoronary bypass graft: Secondary | ICD-10-CM

## 2023-01-28 DIAGNOSIS — Z794 Long term (current) use of insulin: Secondary | ICD-10-CM | POA: Diagnosis not present

## 2023-01-28 DIAGNOSIS — H9190 Unspecified hearing loss, unspecified ear: Secondary | ICD-10-CM | POA: Diagnosis present

## 2023-01-28 DIAGNOSIS — K7460 Unspecified cirrhosis of liver: Secondary | ICD-10-CM | POA: Diagnosis present

## 2023-01-28 DIAGNOSIS — N181 Chronic kidney disease, stage 1: Secondary | ICD-10-CM | POA: Diagnosis present

## 2023-01-28 DIAGNOSIS — R4182 Altered mental status, unspecified: Secondary | ICD-10-CM | POA: Diagnosis not present

## 2023-01-28 DIAGNOSIS — Z66 Do not resuscitate: Secondary | ICD-10-CM | POA: Diagnosis present

## 2023-01-28 DIAGNOSIS — I340 Nonrheumatic mitral (valve) insufficiency: Secondary | ICD-10-CM | POA: Diagnosis not present

## 2023-01-28 DIAGNOSIS — Z91040 Latex allergy status: Secondary | ICD-10-CM

## 2023-01-28 DIAGNOSIS — Z86718 Personal history of other venous thrombosis and embolism: Secondary | ICD-10-CM

## 2023-01-28 DIAGNOSIS — I3481 Nonrheumatic mitral (valve) annulus calcification: Secondary | ICD-10-CM | POA: Diagnosis present

## 2023-01-28 DIAGNOSIS — K219 Gastro-esophageal reflux disease without esophagitis: Secondary | ICD-10-CM | POA: Diagnosis present

## 2023-01-28 DIAGNOSIS — I110 Hypertensive heart disease with heart failure: Secondary | ICD-10-CM | POA: Diagnosis not present

## 2023-01-28 DIAGNOSIS — Z87440 Personal history of urinary (tract) infections: Secondary | ICD-10-CM

## 2023-01-28 LAB — BASIC METABOLIC PANEL
Anion gap: 14 (ref 5–15)
BUN: 27 mg/dL — ABNORMAL HIGH (ref 8–23)
CO2: 23 mmol/L (ref 22–32)
Calcium: 9.7 mg/dL (ref 8.9–10.3)
Chloride: 100 mmol/L (ref 98–111)
Creatinine, Ser: 1.28 mg/dL — ABNORMAL HIGH (ref 0.44–1.00)
GFR, Estimated: 43 mL/min — ABNORMAL LOW (ref >60)
Glucose, Bld: 190 mg/dL — ABNORMAL HIGH (ref 70–99)
Potassium: 3.3 mmol/L — ABNORMAL LOW (ref 3.5–5.1)
Sodium: 137 mmol/L (ref 135–145)

## 2023-01-28 LAB — PROCALCITONIN: Procalcitonin: 0.17 ng/mL

## 2023-01-28 LAB — CBC WITH DIFFERENTIAL/PLATELET
Abs Immature Granulocytes: 0.02 10*3/uL (ref 0.00–0.07)
Basophils Absolute: 0 10*3/uL (ref 0.0–0.1)
Basophils Relative: 0 %
Eosinophils Absolute: 0.1 10*3/uL (ref 0.0–0.5)
Eosinophils Relative: 1 %
HCT: 38 % (ref 36.0–46.0)
Hemoglobin: 11.8 g/dL — ABNORMAL LOW (ref 12.0–15.0)
Immature Granulocytes: 0 %
Lymphocytes Relative: 8 %
Lymphs Abs: 0.4 10*3/uL — ABNORMAL LOW (ref 0.7–4.0)
MCH: 29.2 pg (ref 26.0–34.0)
MCHC: 31.1 g/dL (ref 30.0–36.0)
MCV: 94.1 fL (ref 80.0–100.0)
Monocytes Absolute: 0.3 10*3/uL (ref 0.1–1.0)
Monocytes Relative: 6 %
Neutro Abs: 3.9 10*3/uL (ref 1.7–7.7)
Neutrophils Relative %: 85 %
Platelets: 103 10*3/uL — ABNORMAL LOW (ref 150–400)
RBC: 4.04 MIL/uL (ref 3.87–5.11)
RDW: 18 % — ABNORMAL HIGH (ref 11.5–15.5)
WBC: 4.6 10*3/uL (ref 4.0–10.5)
nRBC: 0 % (ref 0.0–0.2)

## 2023-01-28 LAB — URINALYSIS, W/ REFLEX TO CULTURE (INFECTION SUSPECTED)
Bilirubin Urine: NEGATIVE
Glucose, UA: NEGATIVE mg/dL
Hgb urine dipstick: NEGATIVE
Ketones, ur: NEGATIVE mg/dL
Leukocytes,Ua: NEGATIVE
Nitrite: POSITIVE — AB
Protein, ur: NEGATIVE mg/dL
Specific Gravity, Urine: 1.014 (ref 1.005–1.030)
pH: 5 (ref 5.0–8.0)

## 2023-01-28 LAB — I-STAT VENOUS BLOOD GAS, ED
Acid-Base Excess: 0 mmol/L (ref 0.0–2.0)
Bicarbonate: 24.4 mmol/L (ref 20.0–28.0)
Calcium, Ion: 1.15 mmol/L (ref 1.15–1.40)
HCT: 37 % (ref 36.0–46.0)
Hemoglobin: 12.6 g/dL (ref 12.0–15.0)
O2 Saturation: 87 %
Potassium: 3.4 mmol/L — ABNORMAL LOW (ref 3.5–5.1)
Sodium: 138 mmol/L (ref 135–145)
TCO2: 26 mmol/L (ref 22–32)
pCO2, Ven: 39.8 mmHg — ABNORMAL LOW (ref 44–60)
pH, Ven: 7.396 (ref 7.25–7.43)
pO2, Ven: 53 mmHg — ABNORMAL HIGH (ref 32–45)

## 2023-01-28 LAB — AMMONIA: Ammonia: 17 umol/L (ref 9–35)

## 2023-01-28 LAB — GLUCOSE, CAPILLARY: Glucose-Capillary: 135 mg/dL — ABNORMAL HIGH (ref 70–99)

## 2023-01-28 LAB — BRAIN NATRIURETIC PEPTIDE: B Natriuretic Peptide: 440.6 pg/mL — ABNORMAL HIGH (ref 0.0–100.0)

## 2023-01-28 LAB — TROPONIN I (HIGH SENSITIVITY)
Troponin I (High Sensitivity): 103 ng/L (ref <18)
Troponin I (High Sensitivity): 100 ng/L (ref <18)

## 2023-01-28 LAB — CBG MONITORING, ED: Glucose-Capillary: 188 mg/dL — ABNORMAL HIGH (ref 70–99)

## 2023-01-28 MED ORDER — RIFAXIMIN 550 MG PO TABS
550.0000 mg | ORAL_TABLET | Freq: Two times a day (BID) | ORAL | Status: DC
  Administered 2023-01-28 – 2023-02-07 (×19): 550 mg via ORAL
  Filled 2023-01-28 (×22): qty 1

## 2023-01-28 MED ORDER — METOPROLOL TARTRATE 5 MG/5ML IV SOLN
5.0000 mg | Freq: Once | INTRAVENOUS | Status: AC
  Administered 2023-01-28: 5 mg via INTRAVENOUS
  Filled 2023-01-28: qty 5

## 2023-01-28 MED ORDER — ACETAMINOPHEN 325 MG PO TABS
650.0000 mg | ORAL_TABLET | Freq: Four times a day (QID) | ORAL | Status: DC | PRN
  Administered 2023-01-28 – 2023-02-06 (×9): 650 mg via ORAL
  Filled 2023-01-28 (×10): qty 2

## 2023-01-28 MED ORDER — BISACODYL 5 MG PO TBEC: 5.0000 mg | DELAYED_RELEASE_TABLET | Freq: Every day | ORAL | Status: DC | PRN

## 2023-01-28 MED ORDER — FUROSEMIDE 10 MG/ML IJ SOLN
120.0000 mg | Freq: Three times a day (TID) | INTRAVENOUS | Status: DC
  Administered 2023-01-28: 120 mg via INTRAVENOUS
  Filled 2023-01-28 (×2): qty 12
  Filled 2023-01-28: qty 10
  Filled 2023-01-28: qty 12

## 2023-01-28 MED ORDER — POTASSIUM CHLORIDE 20 MEQ PO PACK
40.0000 meq | PACK | Freq: Two times a day (BID) | ORAL | Status: AC
  Administered 2023-01-28 – 2023-01-29 (×2): 40 meq via ORAL
  Filled 2023-01-28 (×2): qty 2

## 2023-01-28 MED ORDER — LIDOCAINE 5 % EX PTCH
1.0000 | MEDICATED_PATCH | Freq: Every day | CUTANEOUS | Status: DC
  Administered 2023-01-29 – 2023-02-07 (×8): 1 via TRANSDERMAL
  Filled 2023-01-28 (×9): qty 1

## 2023-01-28 MED ORDER — INSULIN ASPART 100 UNIT/ML IJ SOLN
0.0000 [IU] | Freq: Three times a day (TID) | INTRAMUSCULAR | Status: DC
  Administered 2023-01-30: 2 [IU] via SUBCUTANEOUS
  Administered 2023-01-30: 1 [IU] via SUBCUTANEOUS
  Administered 2023-01-31: 5 [IU] via SUBCUTANEOUS
  Administered 2023-01-31: 3 [IU] via SUBCUTANEOUS
  Administered 2023-01-31 – 2023-02-03 (×8): 2 [IU] via SUBCUTANEOUS
  Administered 2023-02-04: 3 [IU] via SUBCUTANEOUS
  Administered 2023-02-04: 5 [IU] via SUBCUTANEOUS
  Administered 2023-02-05 – 2023-02-06 (×4): 3 [IU] via SUBCUTANEOUS
  Administered 2023-02-07: 2 [IU] via SUBCUTANEOUS
  Administered 2023-02-07: 5 [IU] via SUBCUTANEOUS

## 2023-01-28 MED ORDER — ARTIFICIAL TEARS OPHTHALMIC OINT
1.0000 | TOPICAL_OINTMENT | Freq: Every day | OPHTHALMIC | Status: DC
  Administered 2023-01-28 – 2023-02-06 (×10): 1 via OPHTHALMIC
  Filled 2023-01-28: qty 3.5

## 2023-01-28 MED ORDER — FUROSEMIDE 10 MG/ML IJ SOLN
4.0000 mg/h | INTRAVENOUS | Status: DC
  Filled 2023-01-28: qty 20

## 2023-01-28 MED ORDER — POLYETHYLENE GLYCOL 3350 17 G PO PACK
17.0000 g | PACK | Freq: Every day | ORAL | Status: DC
  Administered 2023-01-29 – 2023-02-07 (×8): 17 g via ORAL
  Filled 2023-01-28 (×8): qty 1

## 2023-01-28 MED ORDER — LACTULOSE 10 GM/15ML PO SOLN
20.0000 g | Freq: Every day | ORAL | Status: DC
  Administered 2023-01-28 – 2023-02-03 (×7): 20 g via ORAL
  Filled 2023-01-28 (×7): qty 30

## 2023-01-28 MED ORDER — SPIRONOLACTONE 12.5 MG HALF TABLET
12.5000 mg | ORAL_TABLET | Freq: Every day | ORAL | Status: DC
  Administered 2023-01-28 – 2023-01-30 (×3): 12.5 mg via ORAL
  Filled 2023-01-28 (×3): qty 1

## 2023-01-28 MED ORDER — FUROSEMIDE 10 MG/ML IJ SOLN
40.0000 mg | Freq: Once | INTRAMUSCULAR | Status: AC
  Administered 2023-01-28: 40 mg via INTRAVENOUS
  Filled 2023-01-28: qty 4

## 2023-01-28 MED ORDER — POTASSIUM CHLORIDE 10 MEQ/100ML IV SOLN
10.0000 meq | INTRAVENOUS | Status: AC
  Administered 2023-01-28 (×3): 10 meq via INTRAVENOUS
  Filled 2023-01-28 (×4): qty 100

## 2023-01-28 MED ORDER — HEPARIN (PORCINE) 25000 UT/250ML-% IV SOLN
900.0000 [IU]/h | INTRAVENOUS | Status: DC
  Administered 2023-01-28: 700 [IU]/h via INTRAVENOUS
  Administered 2023-01-29: 900 [IU]/h via INTRAVENOUS
  Filled 2023-01-28 (×2): qty 250

## 2023-01-28 MED ORDER — ENOXAPARIN SODIUM 40 MG/0.4ML IJ SOSY: 40.0000 mg | PREFILLED_SYRINGE | INTRAMUSCULAR | Status: DC

## 2023-01-28 MED ORDER — ALBUTEROL SULFATE (2.5 MG/3ML) 0.083% IN NEBU: 2.5000 mg | INHALATION_SOLUTION | Freq: Four times a day (QID) | RESPIRATORY_TRACT | Status: DC | PRN

## 2023-01-28 MED ORDER — ACETAMINOPHEN 650 MG RE SUPP: 650.0000 mg | Freq: Four times a day (QID) | RECTAL | Status: DC | PRN

## 2023-01-28 MED ORDER — SODIUM CHLORIDE 0.9 % IV SOLN
1.0000 g | Freq: Once | INTRAVENOUS | Status: AC
  Administered 2023-01-28: 1 g via INTRAVENOUS
  Filled 2023-01-28: qty 10

## 2023-01-28 NOTE — H&P (Addendum)
History and Physical   [name] RJJ:[id number] DOB: 07-26-1945 DOA: 01/28/2023 PCP: Venita Sheffield, MD  Chief Complaint: Somnolence Historian: patient, daughter at bedside, review of documents sent from facility  HPI:  [name] is a 77 y.o. female with a PMH significant for CHF, DM2, GERD, HTN, CAD s/p CABG x 3, statin induced rhabdomyolysis, IDA, traumatic subdural hematoma, lumbar fracture, liver cirrhosis secondary to NASH, pancytopenia, sacral pressure ulcer, HLD, hearing loss, osteoporosis, A-fib, esophageal varices, s/p TIPS, prolonged QT interval. At baseline, they live long-term at [facility] and are dependent for ADLs.  She intermittently can ambulate short distances with a walker.  She has been on 1 L nasal cannula for the past several months.  They presented from SNF to the ED on 01/28/2023 with somnolence, respiratory depression x 1 days.  Patient's daughter at bedside provides majority of her history.  She recounts the report from care provider at her SNF.  Patient was in her normal state of health yesterday but has had increased lower extremity swelling intermittently for the past few weeks.  Patient was diagnosed with COVID and completed a Paxlovid treatment this past week.  Beginning this morning, patient was lethargic and less responsive to her caregiver.  She was not alert enough to eat her breakfast so EMS was called.  Although patient reported baseline is 3 L nasal cannula continuously, patient's daughter states that she has been weaned down to 1 L recently.  Upon my exam, patient had a nasal cannula in her nares but the wall oxygen was turned off.  So effectively she was satting 97% without respiratory distress on room air. Patient denies chest pain, palpitations, shortness of breath at rest.  Endorses leg swelling and sore on her bottom.  Denies dysuria. She does endorse that she has been having significant cough productive of white and yellow  phlegm.  In the ED, it was found that they had heart rate 1 20-1 30s, respiratory rate 20, blood pressure 128/85, O2 sats 97 to 100% possibly on room air to 3 L.  Significant findings included: Na+ 137, K+ 3.3, glucose 190, BUN 27, Cr 1.28 (baseline 0.8), WBC 4.6, Hgb 11.8, platelets 103.  Troponin 103>100.  BNP 440.6.  Urinalysis positive nitrites, negative leukocytes, WBC 6 through 10, rare bacteria. EKG: Atrial fibrillation RVR HR 123 Chest x-ray: Cardiomegaly with new bilateral pleural effusions and pulmonary vascular congestion.  Compatible with congestive heart failure/pulmonary edema. CT head: No acute intracranial abnormality.  Mild cerebral atrophy and chronic small vessel disease.  They were initially treated with IV Lasix 40 mg, Lopressor 5 mg x 2, ceftriaxone, heparin infusion.   Patient was admitted to medicine service for further workup and management of acute hypoxic respiratory distress as outlined in detail below.  Assessment/Plan Principal Problem:   Congestive heart failure (CHF) (HCC) Active Problems:   CHF exacerbation (HCC)   Acute on chronic HFpEF-echo in December 2023 showed EF 60 to 65%, grade 2 DD.  Pulmonary congestion on chest x-ray as well as 3+ pitting edema of lower extremities to knees as well as BNP elevated to 440.6 support that her respiratory distress related to CHF exacerbation.  S/p 40 mg IV Lasix in the ED.  Home Lasix: 80 mg daily -Consulted cardiology, Dr. Rennis Golden for multiple comorbidities related to cardiology -Initiating gentle Lasix drip given AKI and overall poor health condition -Strict I's/O -Daily weights -Echo ordered  Atrial fibrillation RVR-heart rates 130s on presentation improved to 110s to 120s after Lopressor  5 mg x 2 in ED.  Patient is not anticoagulated chronically.  Family does not know why she is not on anticoagulation and denies any known history of GI bleeds.  Does have remote history of esophageal varices without any history of  bleeding.  Also not on antiarrhythmic therapy at baseline. -Appreciate cardiology input -Currently on heparin drip - continuous telemetry  Acute on chronic respiratory distress-patient was satting 97% upon initiation of my exam and her while oxygen was not turned on at that time.  Has been using 1 L nasal cannula chronically.  Possible overlying pneumonia on chest x-ray as well as history of productive cough.  Patient already received ceftriaxone IV in ED.  Etiology most likely related to hypervolemia. -Procalcitonin ordered and consider de-escalating antibiotics if negative.  AKI  hypokalemia- Creatinine elevated to 1.28 from a baseline around 0.8.  Likely cardiorenal syndrome.  K+ 3.3.  -Monitor renal function closely while on diuretics -Monitor and replete potassium as needed.  K+ ordered  Acute metabolic encephalopathy-presenting as lethargic from her baseline.  Alert and oriented.  Falls asleep easily when not actively engaged in conversation but she is easily aroused able to follow commands as well as express her needs.  Ammonia level 17 on admission. -Addressed with above problems  Cirrhosis 2/2 NASH  h/o esophageal varices without hemorrhage  s/p TIPS- also contributing to volume overload.  Ammonia level normal on admission -Continue rifaximin -Continue daily lactulose  Insulin-dependent type 2 diabetes-glucose elevated-  - sSSI while she is taking poor p.o. monitor blood sugars closely  Possible UTI-nitrites positive on presentation but denies urinary symptoms. -Follow urine culture  Sacral wound-POA -WOC consult.  Also, please address ulcer on left calf secondary to edema  Chronic HTN- patient's blood pressure is well-controlled in normal limits upon presentation.  Given that she is receiving IV Lopressor for her heart rate, will not restart her home blood pressure medications at this time to avoid hypotension.  They can be titrated on as needed. -Holding home  spironolactone  COVID- diagnosed outpatient and completed 5 day paxlovid course. Likely contributory to current exacerbation but symptoms are mild.   Past Medical History:  Diagnosis Date   Chronic cystitis    Chronic diastolic CHF (congestive heart failure) (HCC)    followed by dr Eden Emms   Chronic kidney disease, stage I    Cirrhosis of liver without ascites (HCC) 03/2016   followed by dr Christella Hartigan (GI);   due to fatty liver;   post op cabg 09/ 2017  , post paracentesis for ascites   Coronary artery disease 01/2016   cardiologist--- dr Estrella Myrtle;   01-27-2016  NSTEMI  s/p cardiac cath;   s/p CABG 01/29/16: LIMA-LAD, SVG-RI, SVG-dRCA   DDD (degenerative disc disease), cervical    per pt w/ chronic neck pain   Dyspnea    occasional   Edema of right lower extremity    Esophageal varices (HCC)    followed by dr Christella Hartigan;   hx multiple banding of large varices   Essential hypertension    no meds   GERD (gastroesophageal reflux disease)    Heart murmur     DX FOR YEARS ASYMPTOMATIC   Hepatic encephalopathy (HCC)    History of adenomatous polyp of colon    History of COVID-19 04/2020   per pt mild  symptoms that resolve   History of kidney stones    passed stones   History of non-ST elevation myocardial infarction (NSTEMI) 01/27/2016   Hyperlipidemia  diet controlled   NAFLD (nonalcoholic fatty liver disease)    Neuropathy, peripheral 04/01/2015   OA (osteoarthritis)    Paroxysmal A-fib (HCC)    08/19/19 afib with RVR, spontaneously converted to SR   Portal vein thrombosis    per dr Christella Hartigan  ,  chronic non-occluded   Thrombocytopenia (HCC)    followed by pcp  and hematology-- dr Mosetta Putt;   likely secondary to liver cirrhosis   Type 2 diabetes mellitus treated with insulin (HCC)    Type 2, followed by pcp    (07-21-2021  per pt checks blood sugar dialy in am fasting average 113--200s)   Urge incontinence of urine    Wears hearing aid in both ears     Past Surgical History:  Procedure  Laterality Date   CARDIAC CATHETERIZATION N/A 01/27/2016   Procedure: Left Heart Cath and Coronary Angiography;  Surgeon: Lyn Records, MD;  Location: [facility] INVASIVE CV LAB;  Service: Cardiovascular;  Laterality: N/A;   COLONOSCOPY WITH PROPOFOL N/A 07/07/2016   Procedure: COLONOSCOPY WITH PROPOFOL;  Surgeon: Rachael Fee, MD;  Location: [facility] ENDOSCOPY;  Service: Endoscopy;  Laterality: N/A;   CORONARY ARTERY BYPASS GRAFT N/A 01/28/2016   Procedure: CORONARY ARTERY BYPASS GRAFTING (CABG) x 3 USING RIGHT LEG GREATER SAPHENOUS VEIN GRAFT;  Surgeon: Loreli Slot, MD;  Location: [facility] OR;  Service: Open Heart Surgery;  Laterality: N/A;   CYSTOSCOPY WITH RETROGRADE PYELOGRAM, URETEROSCOPY AND STENT PLACEMENT Left 05/30/2021   Procedure: CYSTOSCOPY WITH RETROGRADE PYELOGRAM, URETEROSCOPY AND STENT PLACEMENT;  Surgeon: Rene Paci, MD;  Location: [facility] OR;  Service: Urology;  Laterality: Left;   CYSTOSCOPY/URETEROSCOPY/HOLMIUM LASER/STENT PLACEMENT Left 07/26/2021   Procedure: CYSTOSCOPY/RETROGRADE/URETEROSCOPY/HOLMIUM LASER/STENT EXCHANGE;  Surgeon: Jannifer Hick, MD;  Location: [facility];  Service: Urology;  Laterality: Left;   ENDOVEIN HARVEST OF GREATER SAPHENOUS VEIN Right 01/28/2016   Procedure: ENDOVEIN HARVEST OF GREATER SAPHENOUS VEIN;  Surgeon: Loreli Slot, MD;  Location: [facility] OR;  Service: Open Heart Surgery;  Laterality: Right;   ESOPHAGEAL BANDING  03/28/2019   Procedure: ESOPHAGEAL BANDING;  Surgeon: Rachael Fee, MD;  Location: [facility] ENDOSCOPY;  Service: Endoscopy;;   ESOPHAGEAL BANDING  04/07/2019   Procedure: ESOPHAGEAL BANDING;  Surgeon: Charna Corvette, MD;  Location: [facility] ENDOSCOPY;  Service: Endoscopy;;   ESOPHAGOGASTRODUODENOSCOPY N/A 04/07/2019   Procedure: ESOPHAGOGASTRODUODENOSCOPY (EGD);  Surgeon: Charna Somaly, MD;  Location: [facility] ENDOSCOPY;  Service: Endoscopy;  Laterality: N/A;   ESOPHAGOGASTRODUODENOSCOPY (EGD) WITH PROPOFOL N/A 07/07/2016    Procedure: ESOPHAGOGASTRODUODENOSCOPY (EGD) WITH PROPOFOL;  Surgeon: Rachael Fee, MD;  Location: [facility] ENDOSCOPY;  Service: Endoscopy;  Laterality: N/A;   ESOPHAGOGASTRODUODENOSCOPY (EGD) WITH PROPOFOL N/A 03/28/2019   Procedure: ESOPHAGOGASTRODUODENOSCOPY (EGD) WITH PROPOFOL;  Surgeon: Rachael Fee, MD;  Location: [facility] ENDOSCOPY;  Service: Endoscopy;  Laterality: N/A;   EXTRACORPOREAL SHOCK WAVE LITHOTRIPSY  2010   HEMOSTASIS CLIP PLACEMENT  04/07/2019   Procedure: HEMOSTASIS CLIP PLACEMENT;  Surgeon: Charna Mackayla, MD;  Location: [facility] ENDOSCOPY;  Service: Endoscopy;;   IR ANGIOGRAM SELECTIVE EACH ADDITIONAL VESSEL  04/08/2019   IR EMBO ART  VEN HEMORR LYMPH EXTRAV  INC GUIDE ROADMAPPING  04/08/2019   IR PARACENTESIS  04/08/2019   IR RADIOLOGIST EVAL & MGMT  06/13/2019   IR RADIOLOGIST EVAL & MGMT  09/25/2019   IR RADIOLOGIST EVAL & MGMT  07/07/2020   IR RADIOLOGIST EVAL & MGMT  07/30/2021   IR TIPS  04/08/2019   LAMINECTOMY WITH POSTERIOR LATERAL ARTHRODESIS LEVEL 1 Bilateral 02/25/2022  Procedure: Laminectomy and Foraminotomy - Lumbar four-Lumbar five - bilateral, posterolateral arthrodesis Lumbar four-five, extension of hardware Lumbar four-five;  Surgeon: Tia Alert, MD;  Location: [facility] OR;  Service: Neurosurgery;  Laterality: Bilateral;   MAXIMUM ACCESS (MAS)POSTERIOR LUMBAR INTERBODY FUSION (PLIF) 1 LEVEL Left 06/10/2015   Procedure: FOR MAXIMUM ACCESS (MAS) POSTERIOR LUMBAR INTERBODY FUSION (PLIF) LUMBAR THREE-FOUR EXTRAFORAMINAL MICRODISCECTOMY LUMBAR FIVE-SACRAL ONE LEFT;  Surgeon: Tia Alert, MD;  Location: [facility] NEURO ORS;  Service: Neurosurgery;  Laterality: Left;   RADIOLOGY WITH ANESTHESIA N/A 04/08/2019   Procedure: RADIOLOGY WITH ANESTHESIA;  Surgeon: Radiologist, Medication, MD;  Location: [facility] OR;  Service: Radiology;  Laterality: N/A;   SCLEROTHERAPY  04/07/2019   Procedure: SCLEROTHERAPY;  Surgeon: Charna Shenia, MD;  Location: [facility] ENDOSCOPY;  Service: Endoscopy;;   TEE  WITHOUT CARDIOVERSION N/A 01/28/2016   Procedure: TRANSESOPHAGEAL ECHOCARDIOGRAM (TEE);  Surgeon: Loreli Slot, MD;  Location: [facility] OR;  Service: Open Heart Surgery;  Laterality: N/A;   TUBAL LIGATION  1982   Dr Ninetta Lights HERNIA REPAIR N/A 09/10/2019   Procedure: HERNIA REPAIR UMBILICAL ADULT;  Surgeon: Manus Rudd, MD;  Location: [facility] OR;  Service: General;  Laterality: N/A;   VAGINAL HYSTERECTOMY  1997   Dr Vivien Rota     reports that she has never smoked. She has never used smokeless tobacco. She reports that she does not drink alcohol and does not use drugs.  Allergies  Allergen Reactions   Kiwi Extract Anaphylaxis   Gabapentin Nausea And Vomiting   Tdap [Tetanus-Diphth-Acell Pertussis] Swelling and Other (See Comments)    Swelling at injection site, gets very hot   Latex Itching, Dermatitis and Rash   Statins Other (See Comments)    RHABDOMYOLYSIS   Tramadol Nausea And Vomiting    Family History  Problem Relation Age of Onset   Heart disease Mother    Breast cancer Mother    Lung cancer Father    Arthritis Sister    Arthritis Brother    Liver cancer Brother    Lymphoma Brother    Heart disease Maternal Grandmother    Heart disease Maternal Grandfather    Heart disease Paternal Grandmother    Heart disease Paternal Grandfather    Other Daughter        house fire   Breast cancer Maternal Aunt    Breast cancer Paternal Aunt    Colon cancer Neg Hx    Esophageal cancer Neg Hx    Rectal cancer Neg Hx    Stomach cancer Neg Hx     Prior to Admission medications   Medication Sig Start Date End Date Taking? Authorizing Provider  acetaminophen (TYLENOL) 500 MG tablet Take 1,000 mg by mouth every 6 (six) hours as needed for moderate pain.    [provider]  albuterol (PROVENTIL) (2.5 MG/3ML) 0.083% nebulizer solution Take 3 mLs (2.5 mg total) by nebulization every 6 (six) hours as needed for wheezing. 10/18/22   Meredeth Ide, MD  artificial tears  (LACRILUBE) OINT ophthalmic ointment Place into both eyes at bedtime. Patient taking differently: Place 1 Application into both eyes at bedtime. 09/09/22   Barnetta Chapel, MD  Calcium Carb-Cholecalciferol (CALCIUM 600 + D PO) Take 1 tablet by mouth in the morning and at bedtime.    [provider]  Cholecalciferol (VITAMIN D-3) 25 MCG (1000 UT) CAPS Take 1,000 Units by mouth daily.    [provider]  cyanocobalamin (VITAMIN B12) 1000 MCG tablet Take 1,000 mcg by  mouth daily.    [provider]  ezetimibe (ZETIA) 10 MG tablet Take 1 tablet (10 mg total) by mouth daily. 02/09/22   Frederica Kuster, MD  fish oil-omega-3 fatty acids 1000 MG capsule Take 1 g by mouth daily.    [provider]  furosemide (LASIX) 20 MG tablet Take 40 mg by mouth daily.    [provider]  glucose blood test strip One Touch Ultra Blue test strips. Use to test blood sugar three times daily. Dx: E11.65 02/09/22   Frederica Kuster, MD  insulin aspart (NOVOLOG) 100 UNIT/ML injection inject 4 units before breakfast. Inject 6 units before lunch. Inject 8 units before supper. Dx: E11.59 10/10/22   Sharon Seller, NP  Insulin Glargine (BASAGLAR KWIKPEN) 100 UNIT/ML Inject 30 Units into the skin daily. 09/09/22   Berton Mount I, MD  Insulin Pen Needle (B-D ULTRAFINE III SHORT PEN) 31G X 8 MM MISC Use to give Insulin injections daily. Dx: E11.59 09/29/22   Frederica Kuster, MD  Insulin Syringe-Needle U-100 (INSULIN SYRINGE 1CC/31GX5/16") 31G X 5/16" 1 ML MISC USE AS DIRECTED TO INJECT LEVIMIR 07/29/20   Frederica Kuster, MD  lactulose (CHRONULAC) 10 GM/15ML solution TAKE 30 ML BY MOUTH EVERY DAY Patient taking differently: Take 20 g by mouth daily. 08/23/22   Lynann Bologna, MD  Lancets [facility] LANCET) MISC 1 Device by Does not apply route in the morning and at bedtime. 09/14/22   Frederica Kuster, MD  MAGNESIUM PO Take 1 tablet by mouth at bedtime.    [provider]  methocarbamol (ROBAXIN) 500 MG tablet Take 1 tablet (500 mg total) by mouth every 8 (eight) hours as needed (muscle spasm/pain). 09/22/22   Cathren Laine, MD  nystatin cream (MYCOSTATIN) Apply 1 Application topically 2 (two) times daily. Patient not taking: Reported on 10/13/2022    [provider]  pantoprazole (PROTONIX) 40 MG tablet TAKE 1 TABLET(40 MG) BY MOUTH DAILY Patient taking differently: Take 40 mg by mouth daily. 10/25/21   Frederica Kuster, MD  potassium chloride SA (KLOR-CON M) 20 MEQ tablet Take 1 tablet (20 mEq total) by mouth daily. 10/18/22   Meredeth Ide, MD  rifaximin (XIFAXAN) 550 MG TABS tablet Take 550 mg by mouth 2 (two) times daily.    [provider]   I have personally, briefly reviewed patient's prior medical records in [facility] [facility]  Objective: Blood pressure 124/85, pulse (!) 120, temperature (!) 96.9 F (36.1 C), temperature source Oral, resp. rate (!) 21, height 5\' 3"  (1.6 m), weight 55.3 kg, SpO2 98%.   Constitutional: NAD, calm, comfortable HEENT: lids and conjunctivae normal. MMM. Posterior pharynx clear of any exudate or lesions.  Hard of hearing. Neck: normal, supple, no masses, no thyromegaly Respiratory: CTAB, no wheezing, mild crackles and decreased aeration bilateral bases normal respiratory effort. No accessory muscle use.  Cardiovascular: Rapid and irregular rate and murmur auscultated on systolic and diastolic movement.  3+ pitting edema lower extremities up to knees.  2+ pedal pulses.  Extremities are cool to touch. Abdomen: soft, NT, ND, no masses or HSM palpated. Musculoskeletal: No joint deformity upper and lower extremities. Normal muscle tone.  Skin: dry, intact, normal color.  Noninfectious ulcer left calf and sacrum Neurologic: Alert and oriented x 3. Normal speech. Grossly non-focal exam. PERRL.  Generalized weakness but nothing focal.  Falls asleep when not actively engaged in conversation.  She is easily  aroused by voice able to  follow commands and express her wishes. Psychiatric: Normal mood. Congruent affect.  Labs on Admission: I have personally reviewed admission labs and imaging studies  CBC    Component Value Date/Time   WBC 4.6 01/28/2023 1101   RBC 4.04 01/28/2023 1101   HGB 12.6 01/28/2023 1106   HGB 14.5 11/08/2021 1005   HCT 37.0 01/28/2023 1106   HCT 31.0 (L) 05/07/2016 2018   PLT 103 (L) 01/28/2023 1101   PLT 96 (L) 11/08/2021 1005   MCV 94.1 01/28/2023 1101   MCH 29.2 01/28/2023 1101   MCHC 31.1 01/28/2023 1101   RDW 18.0 (H) 01/28/2023 1101   LYMPHSABS 0.4 (L) 01/28/2023 1101   MONOABS 0.3 01/28/2023 1101   EOSABS 0.1 01/28/2023 1101   BASOSABS 0.0 01/28/2023 1101   CMP     Component Value Date/Time   NA 138 01/28/2023 1106   NA 137 06/11/2019 0000   K 3.4 (L) 01/28/2023 1106   CL 100 01/28/2023 1101   CO2 23 01/28/2023 1101   GLUCOSE 190 (H) 01/28/2023 1101   BUN 27 (H) 01/28/2023 1101   BUN 24 (A) 06/11/2019 0000   CREATININE 1.28 (H) 01/28/2023 1101   CREATININE 0.76 09/14/2022 1023   CALCIUM 9.7 01/28/2023 1101   CALCIUM 13.4 (HH) 05/26/2019 1647   PROT 6.9 09/26/2022 0724   PROT 6.7 07/08/2016 1045   ALBUMIN 2.8 (L) 09/26/2022 0724   ALBUMIN 3.5 (L) 07/08/2016 1045   AST 57 (H) 09/26/2022 0724   AST 43 (H) 11/08/2021 1005   ALT 40 09/26/2022 0724   ALT 35 11/08/2021 1005   ALKPHOS 102 09/26/2022 0724   BILITOT 1.6 (H) 09/26/2022 0724   BILITOT 1.5 (H) 11/08/2021 1005   GFRNONAA 43 (L) 01/28/2023 1101   GFRNONAA >60 11/08/2021 1005   GFRNONAA 74 10/13/2020 0843   GFRAA 86 10/13/2020 0843    Radiological Exams on Admission: CT Head Wo Contrast  Result Date: 01/28/2023 CLINICAL DATA:  Acute delirium. EXAM: CT HEAD WITHOUT CONTRAST TECHNIQUE: Contiguous axial images were obtained from the base of the skull through the vertex without intravenous contrast. RADIATION DOSE REDUCTION: This exam was performed according to the departmental  dose-optimization program which includes automated exposure control, adjustment of the mA and/or kV according to patient size and/or use of iterative reconstruction technique. COMPARISON:  10/07/2022 FINDINGS: Brain: No evidence of intracranial hemorrhage, acute infarction, hydrocephalus, extra-axial collection, or mass lesion/mass effect. Mild diffuse cerebral atrophy and chronic small vessel disease again noted Vascular:  No hyperdense vessel or other acute findings. Skull: No evidence of fracture or other significant bone abnormality. Sinuses/Orbits:  No acute findings. Other: None. IMPRESSION: No acute intracranial abnormality. Mild cerebral atrophy and chronic small vessel disease. Electronically Signed   By: Danae Orleans M.D.   On: 01/28/2023 15:22   DG Chest Port 1 View  Result Date: 01/28/2023 CLINICAL DATA:  dib EXAM: PORTABLE CHEST 1 VIEW COMPARISON:  10/07/2022. FINDINGS: There are small-to-moderate bilateral layering pleural effusions, new since the prior study. There also probable underlying compressive atelectatic changes and/or consolidation in the bilateral lower lungs. There is diffuse pulmonary vascular congestion. No pneumothorax. Stable mildly enlarged cardio-mediastinal silhouette. There are surgical staples along the heart border and sternotomy wires, status post CABG (coronary artery bypass graft). Aortic arch calcifications noted. No acute osseous abnormalities. The soft tissues are within normal limits. IMPRESSION: *Cardiomegaly with new bilateral pleural effusions and pulmonary vascular congestion, compatible with congestive heart failure/pulmonary edema. Correlate clinically for superimposed pneumonia. Electronically Signed  By: Jules Schick M.D.   On: 01/28/2023 11:14    EKG: Independently reviewed.  A-fib RVR.  Heart rate 123  DVT prophylaxis:  Heparin drip  Code Status: DNR.  Confirmed this at length with the patient with her daughter and son-in-law at bedside who also  confirmed that this is her wish.  This is in direct contradiction to her most recent admissions as well as the documentation from her facility which labeled her as full code.  I recommended that family discuss her CODE STATUS with the care facility to clarify any misunderstandings Family Communication: Daughter and son-in-law at bedside Disposition Plan: Admit to progressive Consults called: Cardiology, Dr. Marrion Coy, DO [facility]  01/28/2023, 4:42 PM    To contact the appropriate [facility] Attending or Consulting provider: Check [email or web address] for coverage from 7pm-7am

## 2023-01-28 NOTE — ED Provider Notes (Signed)
[place] [facility] AT [facility] Provider Note  CSN: [id number] Arrival date & time: 01/28/23 1032  Chief Complaint(s) Shortness of Breath  HPI [name] is a 77 y.o. female with past medical history as below, significant for diastolic heart failure, CKD, cirrhosis, CAD, DDD, hypertension, hepatic encephalopathy who presents to the ED with complaint of dyspnea.  [facility]; per facility report of difficulty breathing.  She wears 3 L nasal cannula at baseline.  She was diagnosed with COVID around 5 to 7 days ago and is on Paxlovid.  Patient reports mild difficulty breathing but somewhat unchanged from her baseline.  No significant cough, she has nausea no vomiting.  Think she may have a fever.  No chills.  No change to urination.  Worsening swelling noted to her legs.  Per facility patient was "staring off" during breakfast and not eating so they called EMS.  Past Medical History Past Medical History:  Diagnosis Date   Chronic cystitis    Chronic diastolic CHF (congestive heart failure) (HCC)    followed by dr Eden Emms   Chronic kidney disease, stage I    Cirrhosis of liver without ascites (HCC) 03/2016   followed by dr Christella Hartigan (GI);   due to fatty liver;   post op cabg 09/ 2017  , post paracentesis for ascites   Coronary artery disease 01/2016   cardiologist--- dr Estrella Myrtle;   01-27-2016  NSTEMI  s/p cardiac cath;   s/p CABG 01/29/16: LIMA-LAD, SVG-RI, SVG-dRCA   DDD (degenerative disc disease), cervical    per pt w/ chronic neck pain   Dyspnea    occasional   Edema of right lower extremity    Esophageal varices (HCC)    followed by dr Christella Hartigan;   hx multiple banding of large varices   Essential hypertension    no meds   GERD (gastroesophageal reflux disease)    Heart murmur     DX FOR YEARS ASYMPTOMATIC   Hepatic encephalopathy (HCC)    History of adenomatous polyp of colon    History of COVID-19 04/2020   per pt mild  symptoms that  resolve   History of kidney stones    passed stones   History of non-ST elevation myocardial infarction (NSTEMI) 01/27/2016   Hyperlipidemia    diet controlled   NAFLD (nonalcoholic fatty liver disease)    Neuropathy, peripheral 04/01/2015   OA (osteoarthritis)    Paroxysmal A-fib (HCC)    08/19/19 afib with RVR, spontaneously converted to SR   Portal vein thrombosis    per dr Christella Hartigan  ,  chronic non-occluded   Thrombocytopenia (HCC)    followed by pcp  and hematology-- dr Mosetta Putt;   likely secondary to liver cirrhosis   Type 2 diabetes mellitus treated with insulin (HCC)    Type 2, followed by pcp    (07-21-2021  per pt checks blood sugar dialy in am fasting average 113--200s)   Urge incontinence of urine    Wears hearing aid in both ears    Patient Active Problem List   Diagnosis Date Noted   Leukopenia 11/27/2022   UTI (urinary tract infection) 10/13/2022   Acute metabolic encephalopathy 09/03/2022   Elevated troponin 09/03/2022   Hypocalcemia 09/03/2022   Pressure injury of skin 09/03/2022   Traumatic subdural hematoma (HCC) 08/21/2022   Prolonged QT interval 08/21/2022   Subarachnoid hemorrhage (HCC) 08/21/2022   Lumbar transverse process fracture (HCC) 08/21/2022   Fall at home 08/21/2022   Wound  of left leg 08/21/2022   Pancytopenia (HCC) 08/21/2022   Mucocele of appendix 08/21/2022   Weight loss 08/21/2022   Vertigo 07/01/2022   Chronic diastolic CHF (congestive heart failure) (HCC) 07/01/2022   GAD (generalized anxiety disorder) 07/01/2022   Bilateral lower extremity edema 07/01/2022   Tenderness of left calf 07/01/2022   Left hip pain 07/01/2022   Hypoalbuminemia 07/01/2022   S/P lumbar fusion 02/25/2022   Pyohydronephrosis 05/29/2021   URI (upper respiratory infection) 05/12/2021   Lymphopenia 11/05/2020   CHF (congestive heart failure) (HCC)    Coronary artery disease    Type II diabetes mellitus (HCC)    Type 2 diabetes mellitus without complication (HCC)     COVID-19 virus infection 04/2020   A-fib (HCC) 08/19/2019   S/P TIPS (transjugular intrahepatic portosystemic shunt)    Hypercalcemia 05/26/2019   AKI (acute kidney injury) (HCC)    Hyponatremia    Acute lower UTI 05/16/2019   Acute delirium    General weakness 05/15/2019   Encephalopathy, hepatic (HCC) 05/15/2019   Volume depletion 05/15/2019   Bleeding esophageal varices (HCC)    UGI bleed    Infarction of spleen 04/06/2019   Odynophagia 04/01/2019   Dysphagia    Hepatoma (HCC) 01/31/2019   Coronary artery disease of native artery of native heart with stable angina pectoris (HCC) 01/31/2019   Thrombocytopenia (HCC) 09/25/2017   Left forearm pain 05/29/2017   Portal vein thrombosis 04/22/2017   Skin cancer of nose 08/23/2016   History of colonic polyps    Benign neoplasm of rectum    Esophageal varices without bleeding (HCC)    Portal hypertensive gastropathy (HCC)    Leg cramps 05/18/2016   Routine lab draw 05/13/2016   Iron deficiency anemia 05/09/2016   Statin-induced rhabdomyolysis 05/07/2016   Liver cirrhosis secondary to NASH (HCC) 04/05/2016   PVC's (premature ventricular contractions) 04/05/2016   CAD (coronary artery disease) 02/16/2016   Hypokalemia 02/16/2016   S/P CABG x 3 02/02/2016   History of non-ST elevation myocardial infarction (NSTEMI) 01/27/2016   DM2 (diabetes mellitus, type 2) (HCC) 12/15/2015   Trochanteric bursitis of left hip 12/15/2015   S/P lumbar spinal fusion 06/10/2015   Paresthesia 04/01/2015   Osteoporosis 03/31/2015   Hearing loss 06/25/2014   Insomnia 06/25/2014   Hair thinning 06/25/2014   Muscle spasm 10/08/2013   Biceps tendon rupture, proximal 11/24/2012   Hyperlipidemia    GERD (gastroesophageal reflux disease)    Essential hypertension    Home Medication(s) Prior to Admission medications   Medication Sig Start Date End Date Taking? Authorizing Provider  acetaminophen (TYLENOL) 500 MG tablet Take 1,000 mg by mouth every  6 (six) hours as needed for moderate pain.    [provider]  albuterol (PROVENTIL) (2.5 MG/3ML) 0.083% nebulizer solution Take 3 mLs (2.5 mg total) by nebulization every 6 (six) hours as needed for wheezing. 10/18/22   Meredeth Ide, MD  artificial tears (LACRILUBE) OINT ophthalmic ointment Place into both eyes at bedtime. Patient taking differently: Place 1 Application into both eyes at bedtime. 09/09/22   Barnetta Chapel, MD  Calcium Carb-Cholecalciferol (CALCIUM 600 + D PO) Take 1 tablet by mouth in the morning and at bedtime.    [provider]  Cholecalciferol (VITAMIN D-3) 25 MCG (1000 UT) CAPS Take 1,000 Units by mouth daily.    [provider]  cyanocobalamin (VITAMIN B12) 1000 MCG tablet Take 1,000 mcg by mouth daily.    [provider]  ezetimibe (ZETIA) 10  MG tablet Take 1 tablet (10 mg total) by mouth daily. 02/09/22   Frederica Kuster, MD  fish oil-omega-3 fatty acids 1000 MG capsule Take 1 g by mouth daily.    [provider]  furosemide (LASIX) 20 MG tablet Take 40 mg by mouth daily.    [provider]  glucose blood test strip One Touch Ultra Blue test strips. Use to test blood sugar three times daily. Dx: E11.65 02/09/22   Frederica Kuster, MD  insulin aspart (NOVOLOG) 100 UNIT/ML injection inject 4 units before breakfast. Inject 6 units before lunch. Inject 8 units before supper. Dx: E11.59 10/10/22   Sharon Seller, NP  Insulin Glargine (BASAGLAR KWIKPEN) 100 UNIT/ML Inject 30 Units into the skin daily. 09/09/22   Berton Mount I, MD  Insulin Pen Needle (B-D ULTRAFINE III SHORT PEN) 31G X 8 MM MISC Use to give Insulin injections daily. Dx: E11.59 09/29/22   Frederica Kuster, MD  Insulin Syringe-Needle U-100 (INSULIN SYRINGE 1CC/31GX5/16") 31G X 5/16" 1 ML MISC USE AS DIRECTED TO INJECT LEVIMIR 07/29/20   Frederica Kuster, MD  lactulose (CHRONULAC) 10 GM/15ML solution TAKE 30 ML BY MOUTH EVERY DAY Patient taking  differently: Take 20 g by mouth daily. 08/23/22   Lynann Bologna, MD  Lancets [facility] LANCET) MISC 1 Device by Does not apply route in the morning and at bedtime. 09/14/22   Frederica Kuster, MD  MAGNESIUM PO Take 1 tablet by mouth at bedtime.    [provider]  methocarbamol (ROBAXIN) 500 MG tablet Take 1 tablet (500 mg total) by mouth every 8 (eight) hours as needed (muscle spasm/pain). 09/22/22   Cathren Laine, MD  nystatin cream (MYCOSTATIN) Apply 1 Application topically 2 (two) times daily. Patient not taking: Reported on 10/13/2022    [provider]  pantoprazole (PROTONIX) 40 MG tablet TAKE 1 TABLET(40 MG) BY MOUTH DAILY Patient taking differently: Take 40 mg by mouth daily. 10/25/21   Frederica Kuster, MD  potassium chloride SA (KLOR-CON M) 20 MEQ tablet Take 1 tablet (20 mEq total) by mouth daily. 10/18/22   Meredeth Ide, MD  rifaximin (XIFAXAN) 550 MG TABS tablet Take 550 mg by mouth 2 (two) times daily.    [provider]                                                                                                                                    Past Surgical History Past Surgical History:  Procedure Laterality Date   CARDIAC CATHETERIZATION N/A 01/27/2016   Procedure: Left Heart Cath and Coronary Angiography;  Surgeon: Lyn Records, MD;  Location: [facility] INVASIVE CV LAB;  Service: Cardiovascular;  Laterality: N/A;   COLONOSCOPY WITH PROPOFOL N/A 07/07/2016   Procedure: COLONOSCOPY WITH PROPOFOL;  Surgeon: Rachael Fee, MD;  Location: [facility] ENDOSCOPY;  Service: Endoscopy;  Laterality: N/A;   CORONARY ARTERY BYPASS GRAFT N/A  01/28/2016   Procedure: CORONARY ARTERY BYPASS GRAFTING (CABG) x 3 USING RIGHT LEG GREATER SAPHENOUS VEIN GRAFT;  Surgeon: Loreli Slot, MD;  Location: [facility] OR;  Service: Open Heart Surgery;  Laterality: N/A;   CYSTOSCOPY WITH RETROGRADE PYELOGRAM, URETEROSCOPY AND STENT PLACEMENT Left 05/30/2021   Procedure: CYSTOSCOPY  WITH RETROGRADE PYELOGRAM, URETEROSCOPY AND STENT PLACEMENT;  Surgeon: Rene Paci, MD;  Location: [facility] OR;  Service: Urology;  Laterality: Left;   CYSTOSCOPY/URETEROSCOPY/HOLMIUM LASER/STENT PLACEMENT Left 07/26/2021   Procedure: CYSTOSCOPY/RETROGRADE/URETEROSCOPY/HOLMIUM LASER/STENT EXCHANGE;  Surgeon: Jannifer Hick, MD;  Location: [facility];  Service: Urology;  Laterality: Left;   ENDOVEIN HARVEST OF GREATER SAPHENOUS VEIN Right 01/28/2016   Procedure: ENDOVEIN HARVEST OF GREATER SAPHENOUS VEIN;  Surgeon: Loreli Slot, MD;  Location: [facility] OR;  Service: Open Heart Surgery;  Laterality: Right;   ESOPHAGEAL BANDING  03/28/2019   Procedure: ESOPHAGEAL BANDING;  Surgeon: Rachael Fee, MD;  Location: [facility] ENDOSCOPY;  Service: Endoscopy;;   ESOPHAGEAL BANDING  04/07/2019   Procedure: ESOPHAGEAL BANDING;  Surgeon: Charna Anessa, MD;  Location: [facility] ENDOSCOPY;  Service: Endoscopy;;   ESOPHAGOGASTRODUODENOSCOPY N/A 04/07/2019   Procedure: ESOPHAGOGASTRODUODENOSCOPY (EGD);  Surgeon: Charna Chrystine, MD;  Location: [facility] ENDOSCOPY;  Service: Endoscopy;  Laterality: N/A;   ESOPHAGOGASTRODUODENOSCOPY (EGD) WITH PROPOFOL N/A 07/07/2016   Procedure: ESOPHAGOGASTRODUODENOSCOPY (EGD) WITH PROPOFOL;  Surgeon: Rachael Fee, MD;  Location: [facility] ENDOSCOPY;  Service: Endoscopy;  Laterality: N/A;   ESOPHAGOGASTRODUODENOSCOPY (EGD) WITH PROPOFOL N/A 03/28/2019   Procedure: ESOPHAGOGASTRODUODENOSCOPY (EGD) WITH PROPOFOL;  Surgeon: Rachael Fee, MD;  Location: [facility] ENDOSCOPY;  Service: Endoscopy;  Laterality: N/A;   EXTRACORPOREAL SHOCK WAVE LITHOTRIPSY  2010   HEMOSTASIS CLIP PLACEMENT  04/07/2019   Procedure: HEMOSTASIS CLIP PLACEMENT;  Surgeon: Charna Ople, MD;  Location: [facility] ENDOSCOPY;  Service: Endoscopy;;   IR ANGIOGRAM SELECTIVE EACH ADDITIONAL VESSEL  04/08/2019   IR EMBO ART  VEN HEMORR LYMPH EXTRAV  INC GUIDE ROADMAPPING  04/08/2019   IR PARACENTESIS  04/08/2019   IR RADIOLOGIST  EVAL & MGMT  06/13/2019   IR RADIOLOGIST EVAL & MGMT  09/25/2019   IR RADIOLOGIST EVAL & MGMT  07/07/2020   IR RADIOLOGIST EVAL & MGMT  07/30/2021   IR TIPS  04/08/2019   LAMINECTOMY WITH POSTERIOR LATERAL ARTHRODESIS LEVEL 1 Bilateral 02/25/2022   Procedure: Laminectomy and Foraminotomy - Lumbar four-Lumbar five - bilateral, posterolateral arthrodesis Lumbar four-five, extension of hardware Lumbar four-five;  Surgeon: Tia Alert, MD;  Location: [facility] OR;  Service: Neurosurgery;  Laterality: Bilateral;   MAXIMUM ACCESS (MAS)POSTERIOR LUMBAR INTERBODY FUSION (PLIF) 1 LEVEL Left 06/10/2015   Procedure: FOR MAXIMUM ACCESS (MAS) POSTERIOR LUMBAR INTERBODY FUSION (PLIF) LUMBAR THREE-FOUR EXTRAFORAMINAL MICRODISCECTOMY LUMBAR FIVE-SACRAL ONE LEFT;  Surgeon: Tia Alert, MD;  Location: [facility] NEURO ORS;  Service: Neurosurgery;  Laterality: Left;   RADIOLOGY WITH ANESTHESIA N/A 04/08/2019   Procedure: RADIOLOGY WITH ANESTHESIA;  Surgeon: Radiologist, Medication, MD;  Location: [facility] OR;  Service: Radiology;  Laterality: N/A;   SCLEROTHERAPY  04/07/2019   Procedure: SCLEROTHERAPY;  Surgeon: Charna Lashawn, MD;  Location: [facility] ENDOSCOPY;  Service: Endoscopy;;   TEE WITHOUT CARDIOVERSION N/A 01/28/2016   Procedure: TRANSESOPHAGEAL ECHOCARDIOGRAM (TEE);  Surgeon: Loreli Slot, MD;  Location: [facility] OR;  Service: Open Heart Surgery;  Laterality: N/A;   TUBAL LIGATION  1982   Dr Ninetta Lights HERNIA REPAIR N/A 09/10/2019   Procedure: HERNIA REPAIR UMBILICAL ADULT;  Surgeon: Manus Rudd, MD;  Location: [facility] OR;  Service: General;  Laterality: N/A;   VAGINAL HYSTERECTOMY  1997   Dr Vivien Rota   Family History Family History  Problem Relation Age of Onset   Heart disease Mother    Breast cancer Mother    Lung cancer Father    Arthritis Sister    Arthritis Brother    Liver cancer Brother    Lymphoma Brother    Heart disease Maternal Grandmother    Heart disease Maternal Grandfather    Heart disease  Paternal Grandmother    Heart disease Paternal Grandfather    Other Daughter        house fire   Breast cancer Maternal Aunt    Breast cancer Paternal Aunt    Colon cancer Neg Hx    Esophageal cancer Neg Hx    Rectal cancer Neg Hx    Stomach cancer Neg Hx     Social History Social History   Tobacco Use   Smoking status: Never   Smokeless tobacco: Never  Vaping Use   Vaping status: Never Used  Substance Use Topics   Alcohol use: No   Drug use: Never   Allergies Kiwi extract, Gabapentin, Tdap [tetanus-diphth-acell pertussis], Latex, Statins, and Tramadol  Review of Systems Review of Systems  Constitutional:  Positive for appetite change and fever. Negative for chills.  Respiratory:  Positive for cough and shortness of breath.   Cardiovascular:  Positive for leg swelling. Negative for chest pain and palpitations.  Gastrointestinal:  Positive for nausea. Negative for abdominal pain and vomiting.  Musculoskeletal:  Positive for arthralgias.    Physical Exam Vital Signs  I have reviewed the triage vital signs BP 124/85   Pulse (!) 120   Temp (!) 96.9 F (36.1 C) (Oral)   Resp (!) 21   Ht 5\' 3"  (1.6 m)   Wt 55.3 kg   LMP  (LMP Unknown)   SpO2 98%   BMI 21.60 kg/m  Physical Exam Vitals and nursing note reviewed.  Constitutional:      General: She is not in acute distress.    Appearance: Normal appearance.     Comments: Hard of hearing  HENT:     Head: Normocephalic and atraumatic.     Right Ear: External ear normal.     Left Ear: External ear normal.     Nose: Nose normal.     Mouth/Throat:     Mouth: Mucous membranes are moist.  Eyes:     General: No scleral icterus.       Right eye: No discharge.        Left eye: No discharge.  Cardiovascular:     Rate and Rhythm: Tachycardia present. Rhythm irregular.     Pulses: Normal pulses.     Heart sounds: Normal heart sounds.  Pulmonary:     Effort: Pulmonary effort is normal. Tachypnea present. No respiratory  distress.     Breath sounds: Normal breath sounds. No stridor.  Abdominal:     General: Abdomen is flat. There is no distension.     Palpations: Abdomen is soft.     Tenderness: There is no abdominal tenderness.  Musculoskeletal:     Cervical back: No rigidity.     Right lower leg: Edema present.     Left lower leg: Edema present.  Skin:    General: Skin is warm and dry.     Capillary Refill: Capillary refill takes less than 2 seconds.  Neurological:     Mental Status: She is alert. She is disoriented.  GCS: GCS eye subscore is 4. GCS verbal subscore is 5. GCS motor subscore is 6.  Psychiatric:        Mood and Affect: Mood normal.        Behavior: Behavior normal. Behavior is cooperative.     ED Results and Treatments Labs (all labs ordered are listed, but only abnormal results are displayed) Labs Reviewed  BASIC METABOLIC PANEL - Abnormal; Notable for the following components:      Result Value   Potassium 3.3 (*)    Glucose, Bld 190 (*)    BUN 27 (*)    Creatinine, Ser 1.28 (*)    GFR, Estimated 43 (*)    All other components within normal limits  CBC WITH DIFFERENTIAL/PLATELET - Abnormal; Notable for the following components:   Hemoglobin 11.8 (*)    RDW 18.0 (*)    Platelets 103 (*)    Lymphs Abs 0.4 (*)    All other components within normal limits  BRAIN NATRIURETIC PEPTIDE - Abnormal; Notable for the following components:   B Natriuretic Peptide 440.6 (*)    All other components within normal limits  URINALYSIS, W/ REFLEX TO CULTURE (INFECTION SUSPECTED) - Abnormal; Notable for the following components:   APPearance HAZY (*)    Nitrite POSITIVE (*)    Bacteria, UA RARE (*)    All other components within normal limits  I-STAT VENOUS BLOOD GAS, ED - Abnormal; Notable for the following components:   pCO2, Ven 39.8 (*)    pO2, Ven 53 (*)    Potassium 3.4 (*)    All other components within normal limits  CBG MONITORING, ED - Abnormal; Notable for the following  components:   Glucose-Capillary 188 (*)    All other components within normal limits  TROPONIN I (HIGH SENSITIVITY) - Abnormal; Notable for the following components:   Troponin I (High Sensitivity) 103 (*)    All other components within normal limits  TROPONIN I (HIGH SENSITIVITY) - Abnormal; Notable for the following components:   Troponin I (High Sensitivity) 100 (*)    All other components within normal limits  CULTURE, BLOOD (ROUTINE X 2)  CULTURE, BLOOD (ROUTINE X 2)  URINE CULTURE  AMMONIA                                                                                                                          Radiology CT Head Wo Contrast  Result Date: 01/28/2023 CLINICAL DATA:  Acute delirium. EXAM: CT HEAD WITHOUT CONTRAST TECHNIQUE: Contiguous axial images were obtained from the base of the skull through the vertex without intravenous contrast. RADIATION DOSE REDUCTION: This exam was performed according to the departmental dose-optimization program which includes automated exposure control, adjustment of the mA and/or kV according to patient size and/or use of iterative reconstruction technique. COMPARISON:  10/07/2022 FINDINGS: Brain: No evidence of intracranial hemorrhage, acute infarction, hydrocephalus, extra-axial collection, or mass lesion/mass effect. Mild diffuse cerebral atrophy and chronic small vessel disease again  noted Vascular:  No hyperdense vessel or other acute findings. Skull: No evidence of fracture or other significant bone abnormality. Sinuses/Orbits:  No acute findings. Other: None. IMPRESSION: No acute intracranial abnormality. Mild cerebral atrophy and chronic small vessel disease. Electronically Signed   By: Danae Orleans M.D.   On: 01/28/2023 15:22   DG Chest Port 1 View  Result Date: 01/28/2023 CLINICAL DATA:  dib EXAM: PORTABLE CHEST 1 VIEW COMPARISON:  10/07/2022. FINDINGS: There are small-to-moderate bilateral layering pleural effusions, new since the prior  study. There also probable underlying compressive atelectatic changes and/or consolidation in the bilateral lower lungs. There is diffuse pulmonary vascular congestion. No pneumothorax. Stable mildly enlarged cardio-mediastinal silhouette. There are surgical staples along the heart border and sternotomy wires, status post CABG (coronary artery bypass graft). Aortic arch calcifications noted. No acute osseous abnormalities. The soft tissues are within normal limits. IMPRESSION: *Cardiomegaly with new bilateral pleural effusions and pulmonary vascular congestion, compatible with congestive heart failure/pulmonary edema. Correlate clinically for superimposed pneumonia. Electronically Signed   By: Jules Schick M.D.   On: 01/28/2023 11:14    Pertinent labs & imaging results that were available during my care of the patient were reviewed by me and considered in my medical decision making (see MDM for details).  Medications Ordered in ED Medications  metoprolol tartrate (LOPRESSOR) injection 5 mg (has no administration in time range)  furosemide (LASIX) injection 40 mg (40 mg Intravenous Given 01/28/23 1148)  cefTRIAXone (ROCEPHIN) 1 g in sodium chloride 0.9 % 100 mL IVPB (0 g Intravenous Stopped 01/28/23 1410)  metoprolol tartrate (LOPRESSOR) injection 5 mg (5 mg Intravenous Given 01/28/23 1335)                                                                                                                                     Procedures .Critical Care  Performed by: Sloan Leiter, DO Authorized by: Sloan Leiter, DO   Critical care provider statement:    Critical care time (minutes):  44   Critical care time was exclusive of:  Separately billable procedures and treating other patients   Critical care was necessary to treat or prevent imminent or life-threatening deterioration of the following conditions:  Respiratory failure and cardiac failure   Critical care was time spent personally by me on the  following activities:  Development of treatment plan with patient or surrogate, discussions with consultants, evaluation of patient's response to treatment, examination of patient, ordering and review of laboratory studies, ordering and review of radiographic studies, ordering and performing treatments and interventions, pulse oximetry, re-evaluation of patient's condition, review of old charts and obtaining history from patient or surrogate   Care discussed with: admitting provider     (including critical care time)  Medical Decision Making / ED Course    Medical Decision Making:    Yesenia Richarson is a 77 y.o. female with past medical history as  below, significant for diastolic heart failure, CKD, cirrhosis, CAD, DDD, hypertension, hepatic encephalopathy who presents to the ED with complaint of dyspnea.. The complaint involves an extensive differential diagnosis and also carries with it a high risk of complications and morbidity.  Serious etiology was considered. Ddx includes but is not limited to: In my evaluation of this patient's dyspnea my DDx includes, but is not limited to, pneumonia, pulmonary embolism, pneumothorax, pulmonary edema, metabolic acidosis, asthma, COPD, cardiac cause, anemia, anxiety, etc.    Complete initial physical exam performed, notably the patient  was no hypoxia, A-fib with RVR noted on telemetry, lower leg edema symmetric.    Reviewed and confirmed nursing documentation for past medical history, family history, social history.  Vital signs reviewed.    Clinical Course as of 01/28/23 1552  Sat Jan 28, 2023  1331 Creatinine(!): 1.28 Aki, baseline around 0.8 [SG]  1539 CTH stable [SG]  1539 RVR improved following lopressor, volume overloaded on exam, give lasix. Start heparin given Chrys Racer [SG]    Clinical Course User Index [SG] Sloan Leiter, DO     Patient recent COVID-19 diagnosis, here today for worsening difficulty breathing, nausea,  reduced responsiveness per facility.  Heart rate improved after Lopressor, start heparin, CHA2DS2-VASc 4  Troponin elevated, no chest pain, favor demand ischemia in setting of A-fib RVR, CHF exacerbation  Urinalysis with questionable UTI, febrile, no leukocytosis, doubt sepsis;, send culture, start Rocephin  AKI noted on BMP, she is volume overloaded  Patient with A-fib with RVR, improving after beta-blocker.  Acute CHF exacerbation, UTI.  Recommend admission.  She is agreeable. Admitted [facility]                Additional history obtained: -Additional history obtained from EMS -External records from outside source obtained and reviewed including: Chart review including previous notes, labs, imaging, consultation notes including  Home medications, prior labs and imaging   Lab Tests: -I ordered, reviewed, and interpreted labs.   The pertinent results include:   Labs Reviewed  BASIC METABOLIC PANEL - Abnormal; Notable for the following components:      Result Value   Potassium 3.3 (*)    Glucose, Bld 190 (*)    BUN 27 (*)    Creatinine, Ser 1.28 (*)    GFR, Estimated 43 (*)    All other components within normal limits  CBC WITH DIFFERENTIAL/PLATELET - Abnormal; Notable for the following components:   Hemoglobin 11.8 (*)    RDW 18.0 (*)    Platelets 103 (*)    Lymphs Abs 0.4 (*)    All other components within normal limits  BRAIN NATRIURETIC PEPTIDE - Abnormal; Notable for the following components:   B Natriuretic Peptide 440.6 (*)    All other components within normal limits  URINALYSIS, W/ REFLEX TO CULTURE (INFECTION SUSPECTED) - Abnormal; Notable for the following components:   APPearance HAZY (*)    Nitrite POSITIVE (*)    Bacteria, UA RARE (*)    All other components within normal limits  I-STAT VENOUS BLOOD GAS, ED - Abnormal; Notable for the following components:   pCO2, Ven 39.8 (*)    pO2, Ven 53 (*)    Potassium 3.4 (*)    All other components within  normal limits  CBG MONITORING, ED - Abnormal; Notable for the following components:   Glucose-Capillary 188 (*)    All other components within normal limits  TROPONIN I (HIGH SENSITIVITY) - Abnormal; Notable for the following components:  Troponin I (High Sensitivity) 103 (*)    All other components within normal limits  TROPONIN I (HIGH SENSITIVITY) - Abnormal; Notable for the following components:   Troponin I (High Sensitivity) 100 (*)    All other components within normal limits  CULTURE, BLOOD (ROUTINE X 2)  CULTURE, BLOOD (ROUTINE X 2)  URINE CULTURE  AMMONIA    Notable for labs as above, TROP +, BNP +   EKG   EKG Interpretation Date/Time:  Saturday January 28 2023 10:39:51 EDT Ventricular Rate:  123 PR Interval:    QRS Duration:  96 QT Interval:  320 QTC Calculation: 458 R Axis:   70  Text Interpretation: Atrial fibrillation Nonspecific repol abnormality, diffuse leads afib rvr Confirmed by Tanda Rockers ([id number]) on 01/28/2023 10:58:53 AM         Imaging Studies ordered: I ordered imaging studies including CTH CXR I independently visualized the following imaging with scope of interpretation limited to determining acute life threatening conditions related to emergency care; findings noted above, significant for CHF I independently visualized and interpreted imaging. I agree with the radiologist interpretation   Medicines ordered and prescription drug management: Meds ordered this encounter  Medications   furosemide (LASIX) injection 40 mg   cefTRIAXone (ROCEPHIN) 1 g in sodium chloride 0.9 % 100 mL IVPB    Order Specific Question:   Antibiotic Indication:    Answer:   UTI   metoprolol tartrate (LOPRESSOR) injection 5 mg   metoprolol tartrate (LOPRESSOR) injection 5 mg    -I have reviewed the patients home medicines and have made adjustments as needed   Consultations Obtained: na   Cardiac Monitoring: The patient was maintained on a cardiac monitor.  I  personally viewed and interpreted the cardiac monitored which showed an underlying rhythm of: afib rvr  Social Determinants of Health:  Diagnosis or treatment significantly limited by social determinants of health: SNF   Reevaluation: After the interventions noted above, I reevaluated the patient and found that they have improved  Co morbidities that complicate the patient evaluation  Past Medical History:  Diagnosis Date   Chronic cystitis    Chronic diastolic CHF (congestive heart failure) (HCC)    followed by dr Eden Emms   Chronic kidney disease, stage I    Cirrhosis of liver without ascites (HCC) 03/2016   followed by dr Christella Hartigan (GI);   due to fatty liver;   post op cabg 09/ 2017  , post paracentesis for ascites   Coronary artery disease 01/2016   cardiologist--- dr Estrella Myrtle;   01-27-2016  NSTEMI  s/p cardiac cath;   s/p CABG 01/29/16: LIMA-LAD, SVG-RI, SVG-dRCA   DDD (degenerative disc disease), cervical    per pt w/ chronic neck pain   Dyspnea    occasional   Edema of right lower extremity    Esophageal varices (HCC)    followed by dr Christella Hartigan;   hx multiple banding of large varices   Essential hypertension    no meds   GERD (gastroesophageal reflux disease)    Heart murmur     DX FOR YEARS ASYMPTOMATIC   Hepatic encephalopathy (HCC)    History of adenomatous polyp of colon    History of COVID-19 04/2020   per pt mild  symptoms that resolve   History of kidney stones    passed stones   History of non-ST elevation myocardial infarction (NSTEMI) 01/27/2016   Hyperlipidemia    diet controlled   NAFLD (nonalcoholic fatty liver disease)  Neuropathy, peripheral 04/01/2015   OA (osteoarthritis)    Paroxysmal A-fib (HCC)    08/19/19 afib with RVR, spontaneously converted to SR   Portal vein thrombosis    per dr Christella Hartigan  ,  chronic non-occluded   Thrombocytopenia (HCC)    followed by pcp  and hematology-- dr Mosetta Putt;   likely secondary to liver cirrhosis   Type 2 diabetes  mellitus treated with insulin (HCC)    Type 2, followed by pcp    (07-21-2021  per pt checks blood sugar dialy in am fasting average 113--200s)   Urge incontinence of urine    Wears hearing aid in both ears       Dispostion: Disposition decision including need for hospitalization was considered, and patient admitted to the hospital.    Final Clinical Impression(s) / ED Diagnoses Final diagnoses:  Acute congestive heart failure, unspecified heart failure type (HCC)  Acute cystitis without hematuria  Atrial fibrillation with RVR (HCC)  AKI (acute kidney injury) (HCC)        Sloan Leiter, DO 01/28/23 1552

## 2023-01-28 NOTE — Consult Note (Signed)
Cardiology Consultation   Patient ID: [name] MRN: [id number]; DOB: May 27, 1945  Admit date: 01/28/2023 Date of Consult: 01/28/2023  PCP:  Venita Sheffield, MD   [facility] Providers Cardiologist:  Charlton Haws, MD  Cardiology APP:  Beatrice Lecher, PA-C       Patient Profile:   [name] is a 77 y.o. female with a hx of HFpEF, CAD s/p 3v CABG 2017, paroxysmal atrial fibrillation not on anticoagulation, HTN, NASH cirrhosis, portal vein thrombosis s/p TIPS, chronic thrombocytopenia who is being seen 01/28/2023 for the evaluation of HFpEF and AF w/ RVR at the request of Dr. Jamelle Rushing.  History of Present Illness:   History limited by patient somnolence and metabolic encephalopathy.  [name] reports orthopnea requiring three pillows for symptoms relief. Over the past two days her symptoms have worsened and she has been unable to sleep. She also reports worsening lower extremity edema and early satiety/poor appetite. She denies PND or chest discomfort.   She was recently diagnosed with Covid pneumonia and has had a lingering productive cough for a week or so.   She follows with Dr. Eden Emms. She was found to have AF in April of 2021 and spontaneously converted to normal sinus rhythm. Long term anticoagulation was not initiated due to bleeding history. She is prescribed prn metoprolol for palpitations and elevated heart rate.  She had an episode of bradycardia to the 30's in September of 2023 in the context of a presentation for UTI. That spontaneously improved and ambulatory monitor showed no bradycardia after discharge. There was no AF recorded on that monitor.  She received 40 mg IV lasix, two doses of IV metoprolol 5 mg with improvement in HR from 130-110.   Past Medical History:  Diagnosis Date   Chronic cystitis    Chronic diastolic CHF (congestive heart failure) (HCC)    followed by dr Eden Emms   Chronic kidney disease, stage I     Cirrhosis of liver without ascites (HCC) 03/2016   followed by dr Christella Hartigan (GI);   due to fatty liver;   post op cabg 09/ 2017  , post paracentesis for ascites   Coronary artery disease 01/2016   cardiologist--- dr Estrella Myrtle;   01-27-2016  NSTEMI  s/p cardiac cath;   s/p CABG 01/29/16: LIMA-LAD, SVG-RI, SVG-dRCA   DDD (degenerative disc disease), cervical    per pt w/ chronic neck pain   Dyspnea    occasional   Edema of right lower extremity    Esophageal varices (HCC)    followed by dr Christella Hartigan;   hx multiple banding of large varices   Essential hypertension    no meds   GERD (gastroesophageal reflux disease)    Heart murmur     DX FOR YEARS ASYMPTOMATIC   Hepatic encephalopathy (HCC)    History of adenomatous polyp of colon    History of COVID-19 04/2020   per pt mild  symptoms that resolve   History of kidney stones    passed stones   History of non-ST elevation myocardial infarction (NSTEMI) 01/27/2016   Hyperlipidemia    diet controlled   NAFLD (nonalcoholic fatty liver disease)    Neuropathy, peripheral 04/01/2015   OA (osteoarthritis)    Paroxysmal A-fib (HCC)    08/19/19 afib with RVR, spontaneously converted to SR   Portal vein thrombosis    per dr Christella Hartigan  ,  chronic non-occluded   Thrombocytopenia (HCC)    followed by pcp  and hematology--  dr Mosetta Putt;   likely secondary to liver cirrhosis   Type 2 diabetes mellitus treated with insulin (HCC)    Type 2, followed by pcp    (07-21-2021  per pt checks blood sugar dialy in am fasting average 113--200s)   Urge incontinence of urine    Wears hearing aid in both ears     Past Surgical History:  Procedure Laterality Date   CARDIAC CATHETERIZATION N/A 01/27/2016   Procedure: Left Heart Cath and Coronary Angiography;  Surgeon: Lyn Records, MD;  Location: [facility] INVASIVE CV LAB;  Service: Cardiovascular;  Laterality: N/A;   COLONOSCOPY WITH PROPOFOL N/A 07/07/2016   Procedure: COLONOSCOPY WITH PROPOFOL;  Surgeon: Rachael Fee, MD;   Location: [facility] ENDOSCOPY;  Service: Endoscopy;  Laterality: N/A;   CORONARY ARTERY BYPASS GRAFT N/A 01/28/2016   Procedure: CORONARY ARTERY BYPASS GRAFTING (CABG) x 3 USING RIGHT LEG GREATER SAPHENOUS VEIN GRAFT;  Surgeon: Loreli Slot, MD;  Location: [facility] OR;  Service: Open Heart Surgery;  Laterality: N/A;   CYSTOSCOPY WITH RETROGRADE PYELOGRAM, URETEROSCOPY AND STENT PLACEMENT Left 05/30/2021   Procedure: CYSTOSCOPY WITH RETROGRADE PYELOGRAM, URETEROSCOPY AND STENT PLACEMENT;  Surgeon: Rene Paci, MD;  Location: [facility] OR;  Service: Urology;  Laterality: Left;   CYSTOSCOPY/URETEROSCOPY/HOLMIUM LASER/STENT PLACEMENT Left 07/26/2021   Procedure: CYSTOSCOPY/RETROGRADE/URETEROSCOPY/HOLMIUM LASER/STENT EXCHANGE;  Surgeon: Jannifer Hick, MD;  Location: [facility];  Service: Urology;  Laterality: Left;   ENDOVEIN HARVEST OF GREATER SAPHENOUS VEIN Right 01/28/2016   Procedure: ENDOVEIN HARVEST OF GREATER SAPHENOUS VEIN;  Surgeon: Loreli Slot, MD;  Location: [facility] OR;  Service: Open Heart Surgery;  Laterality: Right;   ESOPHAGEAL BANDING  03/28/2019   Procedure: ESOPHAGEAL BANDING;  Surgeon: Rachael Fee, MD;  Location: [facility] ENDOSCOPY;  Service: Endoscopy;;   ESOPHAGEAL BANDING  04/07/2019   Procedure: ESOPHAGEAL BANDING;  Surgeon: Charna Hanalei, MD;  Location: [facility] ENDOSCOPY;  Service: Endoscopy;;   ESOPHAGOGASTRODUODENOSCOPY N/A 04/07/2019   Procedure: ESOPHAGOGASTRODUODENOSCOPY (EGD);  Surgeon: Charna Rilie, MD;  Location: [facility] ENDOSCOPY;  Service: Endoscopy;  Laterality: N/A;   ESOPHAGOGASTRODUODENOSCOPY (EGD) WITH PROPOFOL N/A 07/07/2016   Procedure: ESOPHAGOGASTRODUODENOSCOPY (EGD) WITH PROPOFOL;  Surgeon: Rachael Fee, MD;  Location: [facility] ENDOSCOPY;  Service: Endoscopy;  Laterality: N/A;   ESOPHAGOGASTRODUODENOSCOPY (EGD) WITH PROPOFOL N/A 03/28/2019   Procedure: ESOPHAGOGASTRODUODENOSCOPY (EGD) WITH PROPOFOL;  Surgeon: Rachael Fee, MD;  Location: [facility]  ENDOSCOPY;  Service: Endoscopy;  Laterality: N/A;   EXTRACORPOREAL SHOCK WAVE LITHOTRIPSY  2010   HEMOSTASIS CLIP PLACEMENT  04/07/2019   Procedure: HEMOSTASIS CLIP PLACEMENT;  Surgeon: Charna Marrisa, MD;  Location: [facility] ENDOSCOPY;  Service: Endoscopy;;   IR ANGIOGRAM SELECTIVE EACH ADDITIONAL VESSEL  04/08/2019   IR EMBO ART  VEN HEMORR LYMPH EXTRAV  INC GUIDE ROADMAPPING  04/08/2019   IR PARACENTESIS  04/08/2019   IR RADIOLOGIST EVAL & MGMT  06/13/2019   IR RADIOLOGIST EVAL & MGMT  09/25/2019   IR RADIOLOGIST EVAL & MGMT  07/07/2020   IR RADIOLOGIST EVAL & MGMT  07/30/2021   IR TIPS  04/08/2019   LAMINECTOMY WITH POSTERIOR LATERAL ARTHRODESIS LEVEL 1 Bilateral 02/25/2022   Procedure: Laminectomy and Foraminotomy - Lumbar four-Lumbar five - bilateral, posterolateral arthrodesis Lumbar four-five, extension of hardware Lumbar four-five;  Surgeon: Tia Alert, MD;  Location: [facility] OR;  Service: Neurosurgery;  Laterality: Bilateral;   MAXIMUM ACCESS (MAS)POSTERIOR LUMBAR INTERBODY FUSION (PLIF) 1 LEVEL Left 06/10/2015   Procedure: FOR MAXIMUM ACCESS (MAS) POSTERIOR LUMBAR INTERBODY FUSION (PLIF) LUMBAR THREE-FOUR EXTRAFORAMINAL  MICRODISCECTOMY LUMBAR FIVE-SACRAL ONE LEFT;  Surgeon: Tia Alert, MD;  Location: [facility] NEURO ORS;  Service: Neurosurgery;  Laterality: Left;   RADIOLOGY WITH ANESTHESIA N/A 04/08/2019   Procedure: RADIOLOGY WITH ANESTHESIA;  Surgeon: Radiologist, Medication, MD;  Location: [facility] OR;  Service: Radiology;  Laterality: N/A;   SCLEROTHERAPY  04/07/2019   Procedure: SCLEROTHERAPY;  Surgeon: Charna Tamsin, MD;  Location: [facility] ENDOSCOPY;  Service: Endoscopy;;   TEE WITHOUT CARDIOVERSION N/A 01/28/2016   Procedure: TRANSESOPHAGEAL ECHOCARDIOGRAM (TEE);  Surgeon: Loreli Slot, MD;  Location: [facility] OR;  Service: Open Heart Surgery;  Laterality: N/A;   TUBAL LIGATION  1982   Dr Ninetta Lights HERNIA REPAIR N/A 09/10/2019   Procedure: HERNIA REPAIR UMBILICAL ADULT;  Surgeon: Manus Rudd, MD;  Location: [facility] OR;  Service: General;  Laterality: N/A;   VAGINAL HYSTERECTOMY  1997   Dr Vivien Rota     Home Medications:  Prior to Admission medications   Medication Sig Start Date End Date Taking? Authorizing Provider  acetaminophen (TYLENOL) 500 MG tablet Take 1,000 mg by mouth every 6 (six) hours as needed for moderate pain.   Yes [provider]  albuterol (PROVENTIL) (2.5 MG/3ML) 0.083% nebulizer solution Take 3 mLs (2.5 mg total) by nebulization every 6 (six) hours as needed for wheezing. 10/18/22  Yes Sharl Ma, Sarina Ill, MD  artificial tears (LACRILUBE) OINT ophthalmic ointment Place into both eyes at bedtime. Patient taking differently: Place 1 Application into both eyes at bedtime. 09/09/22  Yes Barnetta Chapel, MD  Calcium Carb-Cholecalciferol (CALCIUM 600 + D PO) Take 1 tablet by mouth in the morning and at bedtime.   Yes [provider]  celecoxib (CELEBREX) 200 MG capsule Take 200 mg by mouth daily. 01/05/23  Yes [provider]  Cholecalciferol (VITAMIN D-3) 25 MCG (1000 UT) CAPS Take 1,000 Units by mouth daily.   Yes [provider]  cyanocobalamin (VITAMIN B12) 1000 MCG tablet Take 1,000 mcg by mouth daily.   Yes [provider]  ezetimibe (ZETIA) 10 MG tablet Take 1 tablet (10 mg total) by mouth daily. 02/09/22  Yes Frederica Kuster, MD  fish oil-omega-3 fatty acids 1000 MG capsule Take 1 g by mouth daily.   Yes [provider]  furosemide (LASIX) 80 MG tablet Take 80 mg by mouth daily. 01/23/23  Yes [provider]  insulin aspart (NOVOLOG) 100 UNIT/ML injection inject 4 units before breakfast. Inject 6 units before lunch. Inject 8 units before supper. Dx: E11.59 Patient taking differently: Inject 12 Units into the skin 3 (three) times daily. Before Meals. 10/10/22  Yes Sharon Seller, NP  Insulin Glargine (BASAGLAR KWIKPEN) 100 UNIT/ML Inject 30 Units into the skin daily. Patient taking differently: Inject  20 Units into the skin 2 (two) times daily. Inject 20 units in the Morning, and 10 units at bedtime. 09/09/22  Yes Berton Mount I, MD  lactulose (CHRONULAC) 10 GM/15ML solution TAKE 30 ML BY MOUTH EVERY DAY Patient taking differently: Take 20 g by mouth daily. 08/23/22  Yes Lynann Bologna, MD  lidocaine (LIDODERM) 5 % Place 1 patch onto the skin daily. Apply to right posterior neck topically on in am, off at HS for pain. 12/18/22  Yes [provider]  magnesium oxide (MAG-OX) 400 (240 Mg) MG tablet Take 400 mg by mouth at bedtime.   Yes [provider]  methocarbamol (ROBAXIN) 500 MG tablet Take 1 tablet (500 mg total) by mouth every 8 (eight) hours as needed (muscle  spasm/pain). 09/22/22  Yes Cathren Laine, MD  pantoprazole (PROTONIX) 40 MG tablet TAKE 1 TABLET(40 MG) BY MOUTH DAILY Patient taking differently: Take 40 mg by mouth daily. 10/25/21  Yes Frederica Kuster, MD  rifaximin (XIFAXAN) 550 MG TABS tablet Take 550 mg by mouth 2 (two) times daily.   Yes [provider]  spironolactone (ALDACTONE) 50 MG tablet Take 50 mg by mouth daily. 01/25/23  Yes [provider]    Inpatient Medications: Scheduled Meds:  artificial tears  1 Application Both Eyes QHS   [START ON 01/29/2023] insulin aspart  0-9 Units Subcutaneous TID WC   lactulose  20 g Oral Daily   [START ON 01/29/2023] lidocaine  1 patch Transdermal Daily   polyethylene glycol  17 g Oral Daily   potassium chloride  40 mEq Oral BID   rifaximin  550 mg Oral BID   spironolactone  12.5 mg Oral Daily   Continuous Infusions:  furosemide     heparin 700 Units/hr (01/28/23 1611)   potassium chloride 10 mEq (01/28/23 1746)   PRN Meds: acetaminophen **OR** acetaminophen, albuterol, bisacodyl  Allergies:    Allergies  Allergen Reactions   Kiwi Extract Anaphylaxis   Gabapentin Nausea And Vomiting   Tdap [Tetanus-Diphth-Acell Pertussis] Swelling and Other (See Comments)    Swelling at injection site,  gets very hot   Latex Itching, Dermatitis and Rash   Statins Other (See Comments)    RHABDOMYOLYSIS   Tramadol Nausea And Vomiting    Social History:   Social History   Socioeconomic History   Marital status: Married    Spouse name: Not on file   Number of children: 6   Years of education: Not on file   Highest education level: Not on file  Occupational History   Occupation: retired  Tobacco Use   Smoking status: Never   Smokeless tobacco: Never  Vaping Use   Vaping status: Never Used  Substance and Sexual Activity   Alcohol use: No   Drug use: Never   Sexual activity: Not Currently    Partners: Male    Birth control/protection: Surgical    Comment: Hysterectomy  Other Topics Concern   Not on file  Social History Narrative   She is retired she is married and lives with her husband   6 children   No alcohol or tobacco never smoker no drug use   Social Determinants of Health   Financial Resource Strain: Low Risk  (06/05/2022)   Received from [facility], [facility]   Overall Financial Resource Strain (CARDIA)    Difficulty of Paying Living Expenses: Not hard at all  Food Insecurity: No Food Insecurity (10/13/2022)   Hunger Vital Sign    Worried About Running Out of Food in the Last Year: Never true    Ran Out of Food in the Last Year: Never true  Transportation Needs: No Transportation Needs (10/13/2022)   PRAPARE - Administrator, Civil Service (Medical): No    Lack of Transportation (Non-Medical): No  Physical Activity: Inactive (05/29/2017)   Exercise Vital Sign    Days of Exercise per Week: 0 days    Minutes of Exercise per Session: 0 min  Stress: No Stress Concern Present (06/05/2022)   Received from [facility], [facility] of Occupational Health - Occupational Stress Questionnaire    Feeling of Stress : Not at all  Social Connections: Unknown (06/02/2022)   Received from [facility], [facility]  Social Network     Social Network: Not on file  Intimate Partner Violence: Not At Risk (10/13/2022)   Humiliation, Afraid, Rape, and Kick questionnaire    Fear of Current or Ex-Partner: No    Emotionally Abused: No    Physically Abused: No    Sexually Abused: No    Family History:    Family History  Problem Relation Age of Onset   Heart disease Mother    Breast cancer Mother    Lung cancer Father    Arthritis Sister    Arthritis Brother    Liver cancer Brother    Lymphoma Brother    Heart disease Maternal Grandmother    Heart disease Maternal Grandfather    Heart disease Paternal Grandmother    Heart disease Paternal Grandfather    Other Daughter        house fire   Breast cancer Maternal Aunt    Breast cancer Paternal Aunt    Colon cancer Neg Hx    Esophageal cancer Neg Hx    Rectal cancer Neg Hx    Stomach cancer Neg Hx      ROS:  Please see the history of present illness.   All other ROS reviewed and negative.     Physical Exam/Data:   Vitals:   01/28/23 1600 01/28/23 1630 01/28/23 1730 01/28/23 1800  BP: 120/76 122/89 110/76 123/80  Pulse: (!) 125 61 (!) 119 (!) 118  Resp: (!) 23 18 (!) 21 16  Temp:      TempSrc:      SpO2: 97% 93% 99% 99%  Weight:      Height:        Intake/Output Summary (Last 24 hours) at 01/28/2023 1856 Last data filed at 01/28/2023 1410 Gross per 24 hour  Intake 100 ml  Output 130 ml  Net -30 ml      01/28/2023   10:41 AM 10/13/2022    6:17 AM 10/07/2022    4:49 PM  Last 3 Weights  Weight (lbs) 121 lb 14.6 oz 122 lb 128 lb 12 oz  Weight (kg) 55.3 kg 55.339 kg 58.4 kg     Body mass index is 21.6 kg/m.  General:  elderly female laying in bed, no acute distress, fatigued appearing HEENT: normal Neck: JVD to earlobe Vascular: No carotid bruits; Distal pulses 2+ bilaterally Cardiac:  tachycardic, irregularly irregular Lungs: diminished breath sounds, bilateral crackles Abd: soft, nontender, no hepatomegaly  Ext: 2+ edema  bilaterally Musculoskeletal:  No deformities, BUE and BLE strength normal and equal Skin: warm and dry  Neuro:  CNs 2-12 intact, no focal abnormalities noted Psych:  Normal affect   EKG:  The EKG was personally reviewed and demonstrates:  AF w/ RVR Telemetry:  Telemetry was personally reviewed and demonstrates:  AF w/ RVR to 100-110 range after metoprolol  Relevant CV Studies: Cardiac cath 01/2016 Prox RCA lesion, 75 %stenosed. Ramus lesion, 95 %stenosed. Prox LAD to Mid LAD lesion, 90 %stenosed. Mid RCA to Dist RCA lesion, 100 %stenosed. Dist RCA lesion, 100 %stenosed. The left ventricular ejection fraction is 45-50% by visual estimate. The left ventricular systolic function is normal. LV end diastolic pressure is mildly elevated.   Acute coronary syndrome with recent total occlusion of the dominant RCA, high-grade obstruction in the proximal LAD which Collateralizes the RCA, and high-grade obstruction and a moderate size ramus intermedius branch. Left ventricular systolic dysfunction with inferobasal moderate hypokinesis and EF of 45-50%. Upper normal LV EDP.  Echo 04/2022  1. Left ventricular ejection fraction, by estimation, is 60 to 65%. Left  ventricular ejection fraction by 3D volume is 65 %. The left ventricle has  normal function. The left ventricle has no regional wall motion  abnormalities. Left ventricular diastolic   parameters are consistent with Grade II diastolic dysfunction  (pseudonormalization).   2. Right ventricular systolic function is normal. The right ventricular  size is normal. There is normal pulmonary artery systolic pressure. The  estimated right ventricular systolic pressure is 30.5 mmHg.   3. Left atrial size was mild to moderately dilated.   4. The mitral valve is degenerative. Mild mitral valve regurgitation.  Mild mitral stenosis. The mean mitral valve gradient is 2.7 mmHg with  average heart rate of 70 bpm. Severe mitral annular calcification.    5. The aortic valve is tricuspid. There is mild calcification of the  aortic valve. There is mild thickening of the aortic valve. Aortic valve  regurgitation is trivial. Aortic valve sclerosis/calcification is present,  without any evidence of aortic  stenosis.   6. The inferior vena cava is dilated in size with >50% respiratory  variability, suggesting right atrial pressure of 8 mmHg.   Monitor 02/10/2022 Patch Wear Time:  2 days and 17 hours (2023-09-23T15:03:34-0400 to 2023-09-26T08:08:25-0400)   Patient had a min HR of 54 bpm, max HR of 148 bpm, and avg HR of 69 bpm. Predominant underlying rhythm was Sinus Rhythm. Bundle Branch Block/IVCD was present. 5 Supraventricular Tachycardia runs occurred, the run with the fastest interval lasting 6 beats  with a max rate of 148 bpm, the longest lasting 8 beats with an avg rate of 119 bpm. Isolated SVEs were rare (<1.0%), SVE Couplets were rare (<1.0%), and SVE Triplets were rare (<1.0%). Isolated VEs were rare (<1.0%), and no VE Couplets or VE Triplets  were present. Inverted QRS complexes possibly due to inverted placement of device.  Laboratory Data:  High Sensitivity Troponin:   Recent Labs  Lab 01/28/23 1101 01/28/23 1304  TROPONINIHS 103* 100*     Chemistry Recent Labs  Lab 01/28/23 1101 01/28/23 1106  NA 137 138  K 3.3* 3.4*  CL 100  --   CO2 23  --   GLUCOSE 190*  --   BUN 27*  --   CREATININE 1.28*  --   CALCIUM 9.7  --   GFRNONAA 43*  --   ANIONGAP 14  --     No results for input(s): "PROT", "ALBUMIN", "AST", "ALT", "ALKPHOS", "BILITOT" in the last 168 hours. Lipids No results for input(s): "CHOL", "TRIG", "HDL", "LABVLDL", "LDLCALC", "CHOLHDL" in the last 168 hours.  Hematology Recent Labs  Lab 01/28/23 1101 01/28/23 1106  WBC 4.6  --   RBC 4.04  --   HGB 11.8* 12.6  HCT 38.0 37.0  MCV 94.1  --   MCH 29.2  --   MCHC 31.1  --   RDW 18.0*  --   PLT 103*  --    Thyroid No results for input(s): "TSH",  "FREET4" in the last 168 hours.  BNP Recent Labs  Lab 01/28/23 1109  BNP 440.6*    DDimer No results for input(s): "DDIMER" in the last 168 hours.   Radiology/Studies:  CT Head Wo Contrast  Result Date: 01/28/2023 CLINICAL DATA:  Acute delirium. EXAM: CT HEAD WITHOUT CONTRAST TECHNIQUE: Contiguous axial images were obtained from the base of the skull through the vertex without intravenous contrast. RADIATION DOSE REDUCTION: This exam was performed according to the departmental  dose-optimization program which includes automated exposure control, adjustment of the mA and/or kV according to patient size and/or use of iterative reconstruction technique. COMPARISON:  10/07/2022 FINDINGS: Brain: No evidence of intracranial hemorrhage, acute infarction, hydrocephalus, extra-axial collection, or mass lesion/mass effect. Mild diffuse cerebral atrophy and chronic small vessel disease again noted Vascular:  No hyperdense vessel or other acute findings. Skull: No evidence of fracture or other significant bone abnormality. Sinuses/Orbits:  No acute findings. Other: None. IMPRESSION: No acute intracranial abnormality. Mild cerebral atrophy and chronic small vessel disease. Electronically Signed   By: Danae Orleans M.D.   On: 01/28/2023 15:22   DG Chest Port 1 View  Result Date: 01/28/2023 CLINICAL DATA:  dib EXAM: PORTABLE CHEST 1 VIEW COMPARISON:  10/07/2022. FINDINGS: There are small-to-moderate bilateral layering pleural effusions, new since the prior study. There also probable underlying compressive atelectatic changes and/or consolidation in the bilateral lower lungs. There is diffuse pulmonary vascular congestion. No pneumothorax. Stable mildly enlarged cardio-mediastinal silhouette. There are surgical staples along the heart border and sternotomy wires, status post CABG (coronary artery bypass graft). Aortic arch calcifications noted. No acute osseous abnormalities. The soft tissues are within normal  limits. IMPRESSION: *Cardiomegaly with new bilateral pleural effusions and pulmonary vascular congestion, compatible with congestive heart failure/pulmonary edema. Correlate clinically for superimposed pneumonia. Electronically Signed   By: Jules Schick M.D.   On: 01/28/2023 11:14     Assessment and Plan:   Acute on chronic heart failure with preserved EF   - BNP elevated, CXR w/ congestion and b/l pleural effusions, JVD elevated  - Complete Echo pending  - Continue diuresis, lasix 120 mg IV BID  - BID BMP  - SGLT2i when AKI resolves - spironolactone 12.5 mg daily for HFpEF and hypokalemia (ultimately would benefit from finerenone on outpatient basis) Atrial fibrillation with rapid ventricular response  - continue heparin gtt  - rates will improve with diuresis  - if still in AF when better compensated, will likely need TEE/DCCV - Not on AC due to cirrhosis, portal vein thrombosis c/b esophageal varices s/p TIPS, esophageal ulcers - Last EGD in 2020 showed multiple esophageal ulcers, may need to repeat this to ensure resolution prior to long term home anticoagulation - Patient should be referred for Watchman procedure as an outpatient if DOAC is contraindicated given her extremely high risk of stroke AKI on CKD - will improve with diuresis CAD s/p CABG - on heparin gtt, ultimately OAC monotherapy likely sufficient given high bleeding risk with cirrhosis so would not prescribe additional antiplatelet for maintenance therapy HTN T2DM   Risk Assessment/Risk Scores:        New York Heart Association (NYHA) Functional Class NYHA Class IV  CHA2DS2-VASc Score =   7,   This indicates a 11.2  % annual risk of stroke. The patient's score is based upon: (CHF, HTN, Age 9, DM, CAD s/p CABG, Female)           For questions or updates, please contact [facility] Please consult [email or web address] for contact info under    Signed, Roderic Palau, MD  01/28/2023 6:56 PM

## 2023-01-28 NOTE — ED Triage Notes (Signed)
Ems called to [facility] for [facility]. Pt dx with COVID had pneumonia before that. Has been on paxlovid x 5 days. Today was last dose.  Tachypnea at 30 for ems  Lung sounds diminished in lower fields    18 Lac  20 Rfa 150 of LR  Cbg 54  given 25 grams D10 cbg now 146  Bp 1120/66 Hr 150 afib rvr  Spo2 95% on 3L (baseline) End title co2 29

## 2023-01-28 NOTE — Progress Notes (Signed)
ANTICOAGULATION CONSULT NOTE - Initial Consult  Pharmacy Consult for heparin Indication: atrial fibrillation  Allergies  Allergen Reactions   Kiwi Extract Anaphylaxis   Gabapentin Nausea And Vomiting   Tdap [Tetanus-Diphth-Acell Pertussis] Swelling and Other (See Comments)    Swelling at injection site, gets very hot   Latex Itching, Dermatitis and Rash   Statins Other (See Comments)    RHABDOMYOLYSIS   Tramadol Nausea And Vomiting    Patient Measurements: Height: 5\' 3"  (160 cm) Weight: 55.3 kg (121 lb 14.6 oz) IBW/kg (Calculated) : 52.4 Heparin Dosing Weight: TBW  Vital Signs: Temp: 96.9 F (36.1 C) (09/21 1534) Temp Source: Oral (09/21 1534) BP: 124/85 (09/21 1530) Pulse Rate: 120 (09/21 1530)  Labs: Recent Labs    01/28/23 1101 01/28/23 1106 01/28/23 1304  HGB 11.8* 12.6  --   HCT 38.0 37.0  --   PLT 103*  --   --   CREATININE 1.28*  --   --   TROPONINIHS 103*  --  100*    Estimated Creatinine Clearance: 30.9 mL/min (A) (by C-G formula based on SCr of 1.28 mg/dL (H)).   Medical History: Past Medical History:  Diagnosis Date   Chronic cystitis    Chronic diastolic CHF (congestive heart failure) (HCC)    followed by dr Eden Emms   Chronic kidney disease, stage I    Cirrhosis of liver without ascites (HCC) 03/2016   followed by dr Christella Hartigan (GI);   due to fatty liver;   post op cabg 09/ 2017  , post paracentesis for ascites   Coronary artery disease 01/2016   cardiologist--- dr Estrella Myrtle;   01-27-2016  NSTEMI  s/p cardiac cath;   s/p CABG 01/29/16: LIMA-LAD, SVG-RI, SVG-dRCA   DDD (degenerative disc disease), cervical    per pt w/ chronic neck pain   Dyspnea    occasional   Edema of right lower extremity    Esophageal varices (HCC)    followed by dr Christella Hartigan;   hx multiple banding of large varices   Essential hypertension    no meds   GERD (gastroesophageal reflux disease)    Heart murmur     DX FOR YEARS ASYMPTOMATIC   Hepatic encephalopathy (HCC)    History  of adenomatous polyp of colon    History of COVID-19 04/2020   per pt mild  symptoms that resolve   History of kidney stones    passed stones   History of non-ST elevation myocardial infarction (NSTEMI) 01/27/2016   Hyperlipidemia    diet controlled   NAFLD (nonalcoholic fatty liver disease)    Neuropathy, peripheral 04/01/2015   OA (osteoarthritis)    Paroxysmal A-fib (HCC)    08/19/19 afib with RVR, spontaneously converted to SR   Portal vein thrombosis    per dr Christella Hartigan  ,  chronic non-occluded   Thrombocytopenia (HCC)    followed by pcp  and hematology-- dr Mosetta Putt;   likely secondary to liver cirrhosis   Type 2 diabetes mellitus treated with insulin (HCC)    Type 2, followed by pcp    (07-21-2021  per pt checks blood sugar dialy in am fasting average 113--200s)   Urge incontinence of urine    Wears hearing aid in both ears     Assessment: 35 YOF presenting with SOB, in afib RVR, hx of afib, she is not on anticoagulation PTA, H/H wnl, plts 103  Goal of Therapy:  Heparin level 0.3-0.7 units/ml Monitor platelets by anticoagulation protocol: Yes   Plan:  Heparin gtt at 700 units/hr, no bolus F/u 8 hour heparin level F/u long term [facility] plan  Daylene Posey, PharmD, [facility] Clinical Pharmacist ED Pharmacist Phone # [phone] 01/28/2023 4:04 PM

## 2023-01-28 NOTE — ED Notes (Signed)
ED TO INPATIENT HANDOFF REPORT  ED Nurse Name and Phone #: Murlean Iba Paramedic [phone]  S Name/Age/Gender [name] 77 y.o. female Room/Bed: 035C/035C  Code Status   Code Status: Limited: Do not attempt resuscitation (DNR) -DNR-LIMITED -Do Not Intubate/DNI   Home/SNF/Other Skilled nursing facility Patient oriented to: self, place, and situation Is this baseline? Yes   Triage Complete: Triage complete  Chief Complaint Congestive heart failure (CHF) (HCC) [I50.9] CHF exacerbation (HCC) [I50.9]  Triage Note Ems called to [facility] for [facility]. Pt dx with COVID had pneumonia before that. Has been on paxlovid x 5 days. Today was last dose.  Tachypnea at 30 for ems  Lung sounds diminished in lower fields    18 Lac  20 Rfa 150 of LR  Cbg 54  given 25 grams D10 cbg now 146  Bp 1120/66 Hr 150 afib rvr  Spo2 95% on 3L (baseline) End title co2 29     Allergies Allergies  Allergen Reactions   Kiwi Extract Anaphylaxis   Gabapentin Nausea And Vomiting   Tdap [Tetanus-Diphth-Acell Pertussis] Swelling and Other (See Comments)    Swelling at injection site, gets very hot   Latex Itching, Dermatitis and Rash   Statins Other (See Comments)    RHABDOMYOLYSIS   Tramadol Nausea And Vomiting    Level of Care/Admitting Diagnosis ED Disposition     ED Disposition  Admit   Condition  --   Comment  Hospital Area: [facility] [[id number]]  Level of Care: Progressive [102]  Admit to Progressive based on following criteria: CARDIOVASCULAR & THORACIC of moderate stability with acute coronary syndrome symptoms/low risk myocardial infarction/hypertensive urgency/arrhythmias/heart failure potentially compromising stability and stable post cardiovascular intervention patients.  May admit patient to [facility] or [facility] if equivalent level of care is available:: No  Covid Evaluation: Confirmed COVID Positive  Diagnosis: CHF exacerbation [facility]) [[id number]]  Admitting  Physician: Leeroy Bock [[id number]]  Attending Physician: Leeroy Bock [[id number]]  Certification:: I certify this patient will need inpatient services for at least 2 midnights          B Medical/Surgery History Past Medical History:  Diagnosis Date   Chronic cystitis    Chronic diastolic CHF (congestive heart failure) (HCC)    followed by dr Eden Emms   Chronic kidney disease, stage I    Cirrhosis of liver without ascites (HCC) 03/2016   followed by dr Christella Hartigan (GI);   due to fatty liver;   post op cabg 09/ 2017  , post paracentesis for ascites   Coronary artery disease 01/2016   cardiologist--- dr Estrella Myrtle;   01-27-2016  NSTEMI  s/p cardiac cath;   s/p CABG 01/29/16: LIMA-LAD, SVG-RI, SVG-dRCA   DDD (degenerative disc disease), cervical    per pt w/ chronic neck pain   Dyspnea    occasional   Edema of right lower extremity    Esophageal varices (HCC)    followed by dr Christella Hartigan;   hx multiple banding of large varices   Essential hypertension    no meds   GERD (gastroesophageal reflux disease)    Heart murmur     DX FOR YEARS ASYMPTOMATIC   Hepatic encephalopathy (HCC)    History of adenomatous polyp of colon    History of COVID-19 04/2020   per pt mild  symptoms that resolve   History of kidney stones    passed stones   History of non-ST elevation myocardial infarction (NSTEMI) 01/27/2016   Hyperlipidemia  diet controlled   NAFLD (nonalcoholic fatty liver disease)    Neuropathy, peripheral 04/01/2015   OA (osteoarthritis)    Paroxysmal A-fib (HCC)    08/19/19 afib with RVR, spontaneously converted to SR   Portal vein thrombosis    per dr Christella Hartigan  ,  chronic non-occluded   Thrombocytopenia (HCC)    followed by pcp  and hematology-- dr Mosetta Putt;   likely secondary to liver cirrhosis   Type 2 diabetes mellitus treated with insulin (HCC)    Type 2, followed by pcp    (07-21-2021  per pt checks blood sugar dialy in am fasting average 113--200s)   Urge incontinence of  urine    Wears hearing aid in both ears    Past Surgical History:  Procedure Laterality Date   CARDIAC CATHETERIZATION N/A 01/27/2016   Procedure: Left Heart Cath and Coronary Angiography;  Surgeon: Lyn Records, MD;  Location: [facility] INVASIVE CV LAB;  Service: Cardiovascular;  Laterality: N/A;   COLONOSCOPY WITH PROPOFOL N/A 07/07/2016   Procedure: COLONOSCOPY WITH PROPOFOL;  Surgeon: Rachael Fee, MD;  Location: [facility] ENDOSCOPY;  Service: Endoscopy;  Laterality: N/A;   CORONARY ARTERY BYPASS GRAFT N/A 01/28/2016   Procedure: CORONARY ARTERY BYPASS GRAFTING (CABG) x 3 USING RIGHT LEG GREATER SAPHENOUS VEIN GRAFT;  Surgeon: Loreli Slot, MD;  Location: [facility] OR;  Service: Open Heart Surgery;  Laterality: N/A;   CYSTOSCOPY WITH RETROGRADE PYELOGRAM, URETEROSCOPY AND STENT PLACEMENT Left 05/30/2021   Procedure: CYSTOSCOPY WITH RETROGRADE PYELOGRAM, URETEROSCOPY AND STENT PLACEMENT;  Surgeon: Rene Paci, MD;  Location: [facility] OR;  Service: Urology;  Laterality: Left;   CYSTOSCOPY/URETEROSCOPY/HOLMIUM LASER/STENT PLACEMENT Left 07/26/2021   Procedure: CYSTOSCOPY/RETROGRADE/URETEROSCOPY/HOLMIUM LASER/STENT EXCHANGE;  Surgeon: Jannifer Hick, MD;  Location: [facility];  Service: Urology;  Laterality: Left;   ENDOVEIN HARVEST OF GREATER SAPHENOUS VEIN Right 01/28/2016   Procedure: ENDOVEIN HARVEST OF GREATER SAPHENOUS VEIN;  Surgeon: Loreli Slot, MD;  Location: [facility] OR;  Service: Open Heart Surgery;  Laterality: Right;   ESOPHAGEAL BANDING  03/28/2019   Procedure: ESOPHAGEAL BANDING;  Surgeon: Rachael Fee, MD;  Location: [facility] ENDOSCOPY;  Service: Endoscopy;;   ESOPHAGEAL BANDING  04/07/2019   Procedure: ESOPHAGEAL BANDING;  Surgeon: Charna Mattilyn, MD;  Location[facility] ENDOSCOPY;  Service: Endoscopy;;   ESOPHAGOGASTRODUODENOSCOPY N/A 04/07/2019   Procedure: ESOPHAGOGASTRODUODENOSCOPY (EGD);  Surgeon: Charna Kaiyana, MD;  Location: [facility] ENDOSCOPY;  Service: Endoscopy;   Laterality: N/A;   ESOPHAGOGASTRODUODENOSCOPY (EGD) WITH PROPOFOL N/A 07/07/2016   Procedure: ESOPHAGOGASTRODUODENOSCOPY (EGD) WITH PROPOFOL;  Surgeon: Rachael Fee, MD;  Location: [facility] ENDOSCOPY;  Service: Endoscopy;  Laterality: N/A;   ESOPHAGOGASTRODUODENOSCOPY (EGD) WITH PROPOFOL N/A 03/28/2019   Procedure: ESOPHAGOGASTRODUODENOSCOPY (EGD) WITH PROPOFOL;  Surgeon: Rachael Fee, MD;  Location: [facility] ENDOSCOPY;  Service: Endoscopy;  Laterality: N/A;   EXTRACORPOREAL SHOCK WAVE LITHOTRIPSY  2010   HEMOSTASIS CLIP PLACEMENT  04/07/2019   Procedure: HEMOSTASIS CLIP PLACEMENT;  Surgeon: Charna Georgeanna, MD;  Location: [facility] ENDOSCOPY;  Service: Endoscopy;;   IR ANGIOGRAM SELECTIVE EACH ADDITIONAL VESSEL  04/08/2019   IR EMBO ART  VEN HEMORR LYMPH EXTRAV  INC GUIDE ROADMAPPING  04/08/2019   IR PARACENTESIS  04/08/2019   IR RADIOLOGIST EVAL & MGMT  06/13/2019   IR RADIOLOGIST EVAL & MGMT  09/25/2019   IR RADIOLOGIST EVAL & MGMT  07/07/2020   IR RADIOLOGIST EVAL & MGMT  07/30/2021   IR TIPS  04/08/2019   LAMINECTOMY WITH POSTERIOR LATERAL ARTHRODESIS LEVEL 1 Bilateral 02/25/2022  Procedure: Laminectomy and Foraminotomy - Lumbar four-Lumbar five - bilateral, posterolateral arthrodesis Lumbar four-five, extension of hardware Lumbar four-five;  Surgeon: Tia Alert, MD;  Location: [facility] OR;  Service: Neurosurgery;  Laterality: Bilateral;   MAXIMUM ACCESS (MAS)POSTERIOR LUMBAR INTERBODY FUSION (PLIF) 1 LEVEL Left 06/10/2015   Procedure: FOR MAXIMUM ACCESS (MAS) POSTERIOR LUMBAR INTERBODY FUSION (PLIF) LUMBAR THREE-FOUR EXTRAFORAMINAL MICRODISCECTOMY LUMBAR FIVE-SACRAL ONE LEFT;  Surgeon: Tia Alert, MD;  Location: [facility] NEURO ORS;  Service: Neurosurgery;  Laterality: Left;   RADIOLOGY WITH ANESTHESIA N/A 04/08/2019   Procedure: RADIOLOGY WITH ANESTHESIA;  Surgeon: Radiologist, Medication, MD;  Location: [facility] OR;  Service: Radiology;  Laterality: N/A;   SCLEROTHERAPY  04/07/2019   Procedure: SCLEROTHERAPY;   Surgeon: Charna Adajah, MD;  Location: [facility] ENDOSCOPY;  Service: Endoscopy;;   TEE WITHOUT CARDIOVERSION N/A 01/28/2016   Procedure: TRANSESOPHAGEAL ECHOCARDIOGRAM (TEE);  Surgeon: Loreli Slot, MD;  Location: [facility] OR;  Service: Open Heart Surgery;  Laterality: N/A;   TUBAL LIGATION  1982   Dr Ninetta Lights HERNIA REPAIR N/A 09/10/2019   Procedure: HERNIA REPAIR UMBILICAL ADULT;  Surgeon: Manus Rudd, MD;  Location: [facility] OR;  Service: General;  Laterality: N/A;   VAGINAL HYSTERECTOMY  1997   Dr Odelia Gage IV Location/Drains/Wounds Patient Lines/Drains/Airways Status     Active Line/Drains/Airways     Name Placement date Placement time Site Days   Peripheral IV 01/28/23 18 G Left Antecubital 01/28/23  --  Antecubital  less than 1   Peripheral IV 01/28/23 20 G Anterior;Distal;Right Forearm 01/28/23  --  Forearm  less than 1   Pressure Injury 09/03/22 Sacrum Right;Medial Stage 2 -  Partial thickness loss of dermis presenting as a shallow open injury with a red, pink wound bed without slough. 09/03/22  1151  -- 147   Wound / Incision (Open or Dehisced) 08/22/22 Non-pressure wound Tibial Left;Posterior 08/22/22  0700  Tibial  159            Intake/Output Last 24 hours  Intake/Output Summary (Last 24 hours) at 01/28/2023 1650 Last data filed at 01/28/2023 1410 Gross per 24 hour  Intake 100 ml  Output 130 ml  Net -30 ml    Labs/Imaging Results for orders placed or performed during the hospital encounter of 01/28/23 (from the past 48 hour(s))  Basic metabolic panel     Status: Abnormal   Collection Time: 01/28/23 11:01 AM  Result Value Ref Range   Sodium 137 135 - 145 mmol/L   Potassium 3.3 (L) 3.5 - 5.1 mmol/L   Chloride 100 98 - 111 mmol/L   CO2 23 22 - 32 mmol/L   Glucose, Bld 190 (H) 70 - 99 mg/dL    Comment: Glucose reference range applies only to samples taken after fasting for at least 8 hours.   BUN 27 (H) 8 - 23 mg/dL   Creatinine, Ser 1.61 (H) 0.44 - 1.00  mg/dL   Calcium 9.7 8.9 - 09.6 mg/dL   GFR, Estimated 43 (L) >60 mL/min    Comment: (NOTE) Calculated using the CKD-EPI Creatinine Equation (2021)    Anion gap 14 5 - 15    Comment: Performed at [facility], [address], [place], [place] [address]  CBC with Differential     Status: Abnormal   Collection Time: 01/28/23 11:01 AM  Result Value Ref Range   WBC 4.6 4.0 - 10.5 K/uL   RBC 4.04 3.87 - 5.11 MIL/uL   Hemoglobin  11.8 (L) 12.0 - 15.0 g/dL   HCT 29.5 28.4 - 13.2 %   MCV 94.1 80.0 - 100.0 fL   MCH 29.2 26.0 - 34.0 pg   MCHC 31.1 30.0 - 36.0 g/dL   RDW 44.0 (H) 10.2 - 72.5 %   Platelets 103 (L) 150 - 400 K/uL   nRBC 0.0 0.0 - 0.2 %   Neutrophils Relative % 85 %   Neutro Abs 3.9 1.7 - 7.7 K/uL   Lymphocytes Relative 8 %   Lymphs Abs 0.4 (L) 0.7 - 4.0 K/uL   Monocytes Relative 6 %   Monocytes Absolute 0.3 0.1 - 1.0 K/uL   Eosinophils Relative 1 %   Eosinophils Absolute 0.1 0.0 - 0.5 K/uL   Basophils Relative 0 %   Basophils Absolute 0.0 0.0 - 0.1 K/uL   Immature Granulocytes 0 %   Abs Immature Granulocytes 0.02 0.00 - 0.07 K/uL    Comment: Performed at [facility], [address], [place], [place] [address]  Troponin I (High Sensitivity)     Status: Abnormal   Collection Time: 01/28/23 11:01 AM  Result Value Ref Range   Troponin I (High Sensitivity) 103 (HH) <18 ng/L    Comment: CRITICAL RESULT CALLED TO, READ BACK BY AND VERIFIED WITH K Glennie Rodda PARAMEDIC AT [phone] BY D LONG (NOTE) Elevated high sensitivity troponin I (hsTnI) values and significant  changes across serial measurements may suggest ACS but many other  chronic and acute conditions are known to elevate hsTnI results.  Refer to the "Links" section for chest pain algorithms and additional  guidance. Performed at [facility], [address], [place], [place] [address]   CBG monitoring, ED     Status: Abnormal   Collection Time: 01/28/23 11:05 AM  Result Value Ref Range    Glucose-Capillary 188 (H) 70 - 99 mg/dL    Comment: Glucose reference range applies only to samples taken after fasting for at least 8 hours.  I-Stat venous blood gas, ([facility] ED, [facility], [facility])     Status: Abnormal   Collection Time: 01/28/23 11:06 AM  Result Value Ref Range   pH, Ven 7.396 7.25 - 7.43   pCO2, Ven 39.8 (L) 44 - 60 mmHg   pO2, Ven 53 (H) 32 - 45 mmHg   Bicarbonate 24.4 20.0 - 28.0 mmol/L   TCO2 26 22 - 32 mmol/L   O2 Saturation 87 %   Acid-Base Excess 0.0 0.0 - 2.0 mmol/L   Sodium 138 135 - 145 mmol/L   Potassium 3.4 (L) 3.5 - 5.1 mmol/L   Calcium, Ion 1.15 1.15 - 1.40 mmol/L   HCT 37.0 36.0 - 46.0 %   Hemoglobin 12.6 12.0 - 15.0 g/dL   Sample type VENOUS   Brain natriuretic peptide     Status: Abnormal   Collection Time: 01/28/23 11:09 AM  Result Value Ref Range   B Natriuretic Peptide 440.6 (H) 0.0 - 100.0 pg/mL    Comment: Performed at [facility], [address], [place], [place] [address]  Ammonia     Status: None   Collection Time: 01/28/23 11:09 AM  Result Value Ref Range   Ammonia 17 9 - 35 umol/L    Comment: Performed at [facility], [address] [place], [place] [address]  Urinalysis, w/ Reflex to Culture (Infection Suspected) -Urine, Clean Catch     Status: Abnormal   Collection Time: 01/28/23 11:12 AM  Result Value Ref Range  Specimen Source URINE, CLEAN CATCH    Color, Urine YELLOW YELLOW   APPearance HAZY (A) CLEAR   Specific Gravity, Urine 1.014 1.005 - 1.030   pH 5.0 5.0 - 8.0   Glucose, UA NEGATIVE NEGATIVE mg/dL   Hgb urine dipstick NEGATIVE NEGATIVE   Bilirubin Urine NEGATIVE NEGATIVE   Ketones, ur NEGATIVE NEGATIVE mg/dL   Protein, ur NEGATIVE NEGATIVE mg/dL   Nitrite POSITIVE (A) NEGATIVE   Leukocytes,Ua NEGATIVE NEGATIVE   RBC / HPF 0-5 0 - 5 RBC/hpf   WBC, UA 6-10 0 - 5 WBC/hpf    Comment:        Reflex urine culture not performed if WBC <=10, OR if Squamous epithelial cells >5. If Squamous epithelial cells >5 suggest  recollection.    Bacteria, UA RARE (A) NONE SEEN   Squamous Epithelial / HPF 0-5 0 - 5 /HPF   Mucus PRESENT    Hyaline Casts, UA PRESENT     Comment: Performed at [facility], [address] [place], [place] [address]  Troponin I (High Sensitivity)     Status: Abnormal   Collection Time: 01/28/23  1:04 PM  Result Value Ref Range   Troponin I (High Sensitivity) 100 (HH) <18 ng/L    Comment: CRITICAL VALUE NOTED. VALUE IS CONSISTENT WITH PREVIOUSLY REPORTED/CALLED VALUE (NOTE) Elevated high sensitivity troponin I (hsTnI) values and significant  changes across serial measurements may suggest ACS but many other  chronic and acute conditions are known to elevate hsTnI results.  Refer to the "Links" section for chest pain algorithms and additional  guidance. Performed at [facility], [address] [place], [place] [address]    *Note: Due to a large number of results and/or encounters for the requested time period, some results have not been displayed. A complete set of results can be found in Results Review.   CT Head Wo Contrast  Result Date: 01/28/2023 CLINICAL DATA:  Acute delirium. EXAM: CT HEAD WITHOUT CONTRAST TECHNIQUE: Contiguous axial images were obtained from the base of the skull through the vertex without intravenous contrast. RADIATION DOSE REDUCTION: This exam was performed according to the departmental dose-optimization program which includes automated exposure control, adjustment of the mA and/or kV according to patient size and/or use of iterative reconstruction technique. COMPARISON:  10/07/2022 FINDINGS: Brain: No evidence of intracranial hemorrhage, acute infarction, hydrocephalus, extra-axial collection, or mass lesion/mass effect. Mild diffuse cerebral atrophy and chronic small vessel disease again noted Vascular:  No hyperdense vessel or other acute findings. Skull: No evidence of fracture or other significant bone abnormality. Sinuses/Orbits:  No acute  findings. Other: None. IMPRESSION: No acute intracranial abnormality. Mild cerebral atrophy and chronic small vessel disease. Electronically Signed   By: Danae Orleans M.D.   On: 01/28/2023 15:22   DG Chest Port 1 View  Result Date: 01/28/2023 CLINICAL DATA:  dib EXAM: PORTABLE CHEST 1 VIEW COMPARISON:  10/07/2022. FINDINGS: There are small-to-moderate bilateral layering pleural effusions, new since the prior study. There also probable underlying compressive atelectatic changes and/or consolidation in the bilateral lower lungs. There is diffuse pulmonary vascular congestion. No pneumothorax. Stable mildly enlarged cardio-mediastinal silhouette. There are surgical staples along the heart border and sternotomy wires, status post CABG (coronary artery bypass graft). Aortic arch calcifications noted. No acute osseous abnormalities. The soft tissues are within normal limits. IMPRESSION: *Cardiomegaly with new bilateral pleural effusions and pulmonary vascular congestion, compatible with congestive heart failure/pulmonary edema. Correlate clinically for superimposed pneumonia. Electronically Signed  By: Jules Schick M.D.   On: 01/28/2023 11:14    Pending Labs Unresulted Labs (From admission, onward)     Start     Ordered   02/04/23 0500  Creatinine, serum  (enoxaparin (LOVENOX)    CrCl >/= 30 ml/min)  Weekly,   R     Comments: while on enoxaparin therapy    01/28/23 1558   01/30/23 0500  Heparin level (unfractionated)  Daily,   R     Placed in "And" Linked Group   01/28/23 1605   01/29/23 0500  Basic metabolic panel  Tomorrow morning,   R        01/28/23 1558   01/29/23 0500  CBC  Tomorrow morning,   R        01/28/23 1558   01/29/23 0500  CBC  Daily,   R     Placed in "And" Linked Group   01/28/23 1605   01/29/23 0100  Heparin level (unfractionated)  Once-Timed,   TIMED        01/28/23 1605   01/28/23 1650  Procalcitonin  Add-on,   AD       References:    Procalcitonin Lower Respiratory Tract  Infection AND Sepsis Procalcitonin Algorithm   01/28/23 1649   01/28/23 1330  Urine Culture  Once,   URGENT       Question:  Indication  Answer:  Dysuria   01/28/23 1329   01/28/23 1045  Blood culture (routine x 2)  BLOOD CULTURE X 2,   R (with STAT occurrences)      01/28/23 1044            Vitals/Pain Today's Vitals   01/28/23 1530 01/28/23 1534 01/28/23 1600 01/28/23 1630  BP: 124/85  120/76 122/89  Pulse: (!) 120  (!) 125 61  Resp: (!) 21  (!) 23 18  Temp:  (!) 96.9 F (36.1 C)    TempSrc:  Oral    SpO2: 98%  97% 93%  Weight:      Height:      PainSc:        Isolation Precautions No active isolations  Medications Medications  acetaminophen (TYLENOL) tablet 650 mg (has no administration in time range)    Or  acetaminophen (TYLENOL) suppository 650 mg (has no administration in time range)  polyethylene glycol (MIRALAX / GLYCOLAX) packet 17 g (0 g Oral Hold 01/28/23 1606)  bisacodyl (DULCOLAX) EC tablet 5 mg (has no administration in time range)  heparin ADULT infusion 100 units/mL (25000 units/252mL) (700 Units/hr Intravenous New Bag/Given 01/28/23 1611)  furosemide (LASIX) injection 40 mg (40 mg Intravenous Given 01/28/23 1148)  cefTRIAXone (ROCEPHIN) 1 g in sodium chloride 0.9 % 100 mL IVPB (0 g Intravenous Stopped 01/28/23 1410)  metoprolol tartrate (LOPRESSOR) injection 5 mg (5 mg Intravenous Given 01/28/23 1335)  metoprolol tartrate (LOPRESSOR) injection 5 mg (5 mg Intravenous Given 01/28/23 1552)    Mobility non-ambulatory     Focused Assessments Cardiac Assessment Handoff:    Lab Results  Component Value Date   CKTOTAL 488 (H) 05/27/2022   CKMB 3.2 05/13/2016   TROPONINI 6.98 (HH) 01/27/2016   Lab Results  Component Value Date   DDIMER 1.42 (H) 05/09/2015   Does the Patient currently have chest pain? No    R Recommendations: See Admitting Provider Note  Report given to:   Additional Notes:

## 2023-01-29 ENCOUNTER — Inpatient Hospital Stay (HOSPITAL_COMMUNITY): Payer: Medicare Other

## 2023-01-29 DIAGNOSIS — I10 Essential (primary) hypertension: Secondary | ICD-10-CM

## 2023-01-29 DIAGNOSIS — N3000 Acute cystitis without hematuria: Secondary | ICD-10-CM

## 2023-01-29 DIAGNOSIS — I5033 Acute on chronic diastolic (congestive) heart failure: Secondary | ICD-10-CM | POA: Diagnosis not present

## 2023-01-29 DIAGNOSIS — K7460 Unspecified cirrhosis of liver: Secondary | ICD-10-CM

## 2023-01-29 DIAGNOSIS — U071 COVID-19: Secondary | ICD-10-CM

## 2023-01-29 DIAGNOSIS — E7849 Other hyperlipidemia: Secondary | ICD-10-CM

## 2023-01-29 DIAGNOSIS — K7581 Nonalcoholic steatohepatitis (NASH): Secondary | ICD-10-CM

## 2023-01-29 DIAGNOSIS — I5023 Acute on chronic systolic (congestive) heart failure: Secondary | ICD-10-CM | POA: Diagnosis not present

## 2023-01-29 DIAGNOSIS — I8500 Esophageal varices without bleeding: Secondary | ICD-10-CM

## 2023-01-29 DIAGNOSIS — I4891 Unspecified atrial fibrillation: Secondary | ICD-10-CM

## 2023-01-29 DIAGNOSIS — D696 Thrombocytopenia, unspecified: Secondary | ICD-10-CM

## 2023-01-29 DIAGNOSIS — I25118 Atherosclerotic heart disease of native coronary artery with other forms of angina pectoris: Secondary | ICD-10-CM

## 2023-01-29 DIAGNOSIS — G9341 Metabolic encephalopathy: Secondary | ICD-10-CM

## 2023-01-29 DIAGNOSIS — J9611 Chronic respiratory failure with hypoxia: Secondary | ICD-10-CM

## 2023-01-29 LAB — GLUCOSE, CAPILLARY
Glucose-Capillary: 115 mg/dL — ABNORMAL HIGH (ref 70–99)
Glucose-Capillary: 95 mg/dL (ref 70–99)
Glucose-Capillary: 119 mg/dL — ABNORMAL HIGH (ref 70–99)
Glucose-Capillary: 126 mg/dL — ABNORMAL HIGH (ref 70–99)

## 2023-01-29 LAB — ECHOCARDIOGRAM COMPLETE
Height: 62 in
S' Lateral: 4.1 cm
Weight: 2462.1 oz

## 2023-01-29 LAB — CBC
HCT: 39.4 % (ref 36.0–46.0)
Hemoglobin: 12.4 g/dL (ref 12.0–15.0)
MCH: 29.4 pg (ref 26.0–34.0)
MCHC: 31.5 g/dL (ref 30.0–36.0)
MCV: 93.4 fL (ref 80.0–100.0)
Platelets: 129 10*3/uL — ABNORMAL LOW (ref 150–400)
RBC: 4.22 MIL/uL (ref 3.87–5.11)
RDW: 18.3 % — ABNORMAL HIGH (ref 11.5–15.5)
WBC: 5.7 10*3/uL (ref 4.0–10.5)
nRBC: 0 % (ref 0.0–0.2)

## 2023-01-29 LAB — BASIC METABOLIC PANEL
Anion gap: 14 (ref 5–15)
BUN: 27 mg/dL — ABNORMAL HIGH (ref 8–23)
CO2: 20 mmol/L — ABNORMAL LOW (ref 22–32)
Calcium: 9.1 mg/dL (ref 8.9–10.3)
Chloride: 101 mmol/L (ref 98–111)
Creatinine, Ser: 1.52 mg/dL — ABNORMAL HIGH (ref 0.44–1.00)
GFR, Estimated: 35 mL/min — ABNORMAL LOW (ref >60)
Glucose, Bld: 149 mg/dL — ABNORMAL HIGH (ref 70–99)
Potassium: 4.9 mmol/L (ref 3.5–5.1)
Sodium: 135 mmol/L (ref 135–145)

## 2023-01-29 LAB — HEPARIN LEVEL (UNFRACTIONATED): Heparin Unfractionated: 0.11 [IU]/mL — ABNORMAL LOW (ref 0.30–0.70)

## 2023-01-29 MED ORDER — ENOXAPARIN SODIUM 30 MG/0.3ML IJ SOSY
30.0000 mg | PREFILLED_SYRINGE | INTRAMUSCULAR | Status: DC
  Administered 2023-01-29 – 2023-02-02 (×5): 30 mg via SUBCUTANEOUS
  Filled 2023-01-29 (×5): qty 0.3

## 2023-01-29 MED ORDER — METOPROLOL TARTRATE 25 MG PO TABS
25.0000 mg | ORAL_TABLET | Freq: Three times a day (TID) | ORAL | Status: DC
  Administered 2023-01-29 – 2023-02-07 (×28): 25 mg via ORAL
  Filled 2023-01-29 (×29): qty 1

## 2023-01-29 MED ORDER — SODIUM CHLORIDE 0.9 % IV SOLN
1.0000 g | Freq: Two times a day (BID) | INTRAVENOUS | Status: AC
  Administered 2023-01-30 – 2023-02-03 (×9): 1 g via INTRAVENOUS
  Filled 2023-01-29 (×9): qty 20

## 2023-01-29 MED ORDER — QUETIAPINE FUMARATE 25 MG PO TABS
25.0000 mg | ORAL_TABLET | Freq: Every day | ORAL | Status: DC
  Administered 2023-01-29 – 2023-01-31 (×3): 25 mg via ORAL
  Filled 2023-01-29 (×3): qty 1

## 2023-01-29 MED ORDER — SODIUM CHLORIDE 0.9 % IV SOLN
1.0000 g | Freq: Two times a day (BID) | INTRAVENOUS | Status: DC
  Administered 2023-01-29: 1 g via INTRAVENOUS
  Filled 2023-01-29 (×2): qty 20

## 2023-01-29 MED ORDER — FUROSEMIDE 10 MG/ML IJ SOLN
80.0000 mg | Freq: Two times a day (BID) | INTRAMUSCULAR | Status: DC
  Administered 2023-01-29 – 2023-01-30 (×2): 80 mg via INTRAVENOUS
  Filled 2023-01-29 (×2): qty 8

## 2023-01-29 NOTE — Progress Notes (Addendum)
Progress Note  Patient Name: [name] Date of Encounter: 01/29/2023  Primary Cardiologist: Charlton Haws, MD  Interval Summary   Chart reviewed including cardiology consultation by Dr. Elayne Guerin.  Patient presented from [facility] to ER due to worsening somnolence and increasing shortness of breath.  She was diagnosed with COVID-19 a week ago and treated with Paxlovid.  Found to be in rapid atrial fibrillation on evaluation and also evidence of fluid overload.  Chest x-ray showing cardiomegaly and small to moderate bilateral pleural effusions.  She has a very complex medical history as described in the history and physical and cardiology notes.  She has previously documented paroxysmal atrial fibrillation and has not been anticoagulated given high bleeding risk in the setting of Elita Boone cirrhosis with portal venous thrombosis status post TIPS and chronic thrombocytopenia, also esophageal varices.  CHA2DS2-VASc score is 7.  She was given IV Lopressor in the ER, although is not on any AV nodal blockers at this time with persistent RVR.  She states that she "feels fine," although history is somewhat limited.  She does mention that she has felt palpitations for some time now, preceding COVID-19.  Duration of atrial fibrillation is uncertain.  Vital Signs    Vitals:   01/28/23 2025 01/29/23 0023 01/29/23 0621 01/29/23 0925  BP: (!) 116/93 108/71 106/82 118/77  Pulse: (!) 114 (!) 118 (!) 117 (!) 121  Resp: 15 15 18 19   Temp: 97.6 F (36.4 C) 97.6 F (36.4 C) 98.5 F (36.9 C) 98 F (36.7 C)  TempSrc: Oral Oral Oral Oral  SpO2: 100% 100% 99% 98%  Weight:   69.8 kg   Height:   5\' 2"  (1.575 m)     Intake/Output Summary (Last 24 hours) at 01/29/2023 0941 Last data filed at 01/29/2023 0901 Gross per 24 hour  Intake 565.67 ml  Output 530 ml  Net 35.67 ml   Filed Weights   01/28/23 1041 01/29/23 0621  Weight: 55.3 kg 69.8 kg    Physical Exam   GEN: Chronically ill-appearing woman, no  acute distress. Neck: Difficult to assess JVP. Cardiac: Irregularly irregular with 1/6 systolic murmur and no obvious gallop.  Respiratory: Decreased breath sounds at the bases with some egophony mid lung zones. GI: Soft, nontender, bowel sounds present. MS: 2+ lower leg edema.  ECG/Telemetry    Telemetry reviewed showing rapid atrial fibrillation.  Labs    Chemistry Recent Labs  Lab 01/28/23 1101 01/28/23 1106 01/29/23 0130  NA 137 138 135  K 3.3* 3.4* 4.9  CL 100  --  101  CO2 23  --  20*  GLUCOSE 190*  --  149*  BUN 27*  --  27*  CREATININE 1.28*  --  1.52*  CALCIUM 9.7  --  9.1  GFRNONAA 43*  --  35*  ANIONGAP 14  --  14    Hematology Recent Labs  Lab 01/28/23 1101 01/28/23 1106 01/29/23 0130  WBC 4.6  --  5.7  RBC 4.04  --  4.22  HGB 11.8* 12.6 12.4  HCT 38.0 37.0 39.4  MCV 94.1  --  93.4  MCH 29.2  --  29.4  MCHC 31.1  --  31.5  RDW 18.0*  --  18.3*  PLT 103*  --  129*   Cardiac Enzymes Recent Labs  Lab 01/28/23 1101 01/28/23 1304  TROPONINIHS 103* 100*   Lipid Panel     Component Value Date/Time   CHOL 174 01/04/2022 1434   CHOL 168 12/15/2020  0950   TRIG 91 01/04/2022 1434   HDL 89 01/04/2022 1434   HDL 85 12/15/2020 0950   CHOLHDL 2.0 01/04/2022 1434   VLDL 9 08/20/2019 0307   LDLCALC 68 01/04/2022 1434   LABVLDL 13 12/15/2020 0950    Cardiac Studies   Echocardiogram pending.  Assessment & Plan   1.  Atrial fibrillation with RVR, duration uncertain and likely persistent.  CHA2DS2-VASc score is 7.  She did have COVID-19 about a week ago, however indicates a sense of palpitations prior to that.  She has prior documented history of paroxysmal atrial fibrillation, but has not been anticoagulated given high bleeding risk in the setting of cirrhosis with esophageal varices, portal venous thrombosis status post TIPS, and thrombocytopenia.  2.  Fluid overload, possibly HFpEF although could also be generalized body edema/anasarca in the  setting of hepatic disease.  3.  Multivessel CAD status post CABG in 2017 with LIMA to LAD, SVG to ramus intermedius, SVG to PDA.  LVEF 60 to 65% by echocardiogram in December 2023.  4.  Mitral valve disease, mild mitral regurgitation and stenosis by echocardiogram in December 2023.  This is in the setting of severe mitral annular calcification.  5.  AKI, creatinine up to 1.52.  I reviewed the cardiology consultation note.  She is not a candidate for TEE cardioversion in light of known esophageal varices and the fact that she cannot be consistently anticoagulated due to high bleeding risk as discussed above.  Currently on IV heparin, although not sure this is a bridge to DOAC given the situation.  Starting divided dose Lopressor for heart rate control, may need to consider IV amiodarone although would generally avoid this in the setting of hepatic disease if possible.  Echocardiogram to be updated, but ideally would wait until heart rate is better controlled.  Cutting back IV Lasix, will need a Foley catheter most likely, follow renal function closely.  CRITICAL CARE Performed by: Nona Dell  Total critical care time: 30 minutes  Critical care time was exclusive of separately billable procedures and treating other patients.  Critical care was necessary to treat or prevent imminent or life-threatening deterioration.  Critical care was time spent personally by me on the following activities: development of treatment plan with patient and/or surrogate as well as nursing, discussions with consultants, evaluation of patient's response to treatment, examination of patient, obtaining history from patient or surrogate, ordering and performing treatments and interventions, ordering and review of laboratory studies, ordering and review of radiographic studies, pulse oximetry and re-evaluation of patient's condition.   For questions or updates, please contact [facility] Please consult  [email or web address] for contact info under   Signed, Nona Dell, MD  01/29/2023, 9:41 AM

## 2023-01-29 NOTE — Hospital Course (Addendum)
Harli Engelken is a 77 y.o. female with a history of NASH cirrhosis, portal hypertensive gastropathy status post TIPS, thrombocytopenia, chronic respiratory failure, paroxysmal atrial fibrillation, diabetes mellitus type 2, diastolic heart failure, hypertension, CAD status post CABG, recurrent ESBL UTI.  Patient presented secondary to worsening confusion.  Patient found to be in atrial fibrillation with RVR in addition to having evidence of recurrent urinary tract infection.  Patient started on antibiotics for treatment and atrial fibrillation managed with metoprolol with control of high rates.  Hospitalization complicated by continued/recurrent altered mental status which has improved significantly with adjustment of lactulose for management of hepatic encephalopathy.

## 2023-01-29 NOTE — Progress Notes (Signed)
Writer responded to tele sitter alarm, Pt is confused and has already pulled both Ivs out. Applied dressing to bleeding iv site.

## 2023-01-29 NOTE — Progress Notes (Signed)
  Echocardiogram 2D Echocardiogram has been performed.  [name] 01/29/2023, 4:28 PM

## 2023-01-29 NOTE — Assessment & Plan Note (Signed)
-   Hold home Zetia

## 2023-01-29 NOTE — Assessment & Plan Note (Addendum)
Dx'd 9/17.  Suspect her symptoms are not from COVID.  Completed Paxlovid - Last day of isolation 9/26

## 2023-01-29 NOTE — Progress Notes (Signed)
Pt is back in soft bilateral wrist restraints, MD Made aware. Tele sitter still in place. Iv consult placed to place Ivs for IV abx.

## 2023-01-29 NOTE — Assessment & Plan Note (Signed)
Still edematous on exam, chest imaging with effusions, edema.  Increased O2 needs from baseline.  Echo last Dec normal EF - Continue IV Lasix, dosing per Cardiology - Strict I/Os, daily weights - Daily BMP - Continue spironolactone, metoprolol - Follow updated echo

## 2023-01-29 NOTE — Assessment & Plan Note (Signed)
Rates still uncontrolled.  Given hx variceal bleeding and SDH/SAH from ground level fall, her cardiologist has recommended against OAC and we do as well.  Not DCCV candidate. - Stop heparin gtt - Increase new metoprolol

## 2023-01-29 NOTE — Assessment & Plan Note (Signed)
Acute respiratory failure ruled out

## 2023-01-29 NOTE — Assessment & Plan Note (Signed)
At baseline, has mild memory impairment, but interactive and appropriate.  Here, is disoriented and having difficulty following commands, sleepy.  Multifactorial from CHF, Afib, UTI, maybe COVID. - Start seroquel at night Wellsite geologist

## 2023-01-29 NOTE — Assessment & Plan Note (Signed)
Glucosee controled - Hold glargine - Continue SS correction insulin

## 2023-01-29 NOTE — Progress Notes (Signed)
Pharmacy Antibiotic Note  [name] is a 77 y.o. female admitted on 01/28/2023 with Afib with RVR and possible UTI.  Pharmacy has been consulted for Meropenem dosing. Patient has a prior history of Klebsiella UTI back in 08/2022, treated with Meropenem.   AKI - Scr >1, WBC wnl, afebrile Tmax 98.5  Plan: Start Meropenem 1g Q12H Monitor renal function for dose adjustments  F/U urine cx sensitivities, bcx F/U abx LOT   Height: 5\' 2"  (157.5 cm) Weight: 69.8 kg (153 lb 14.1 oz) IBW/kg (Calculated) : 50.1  Temp (24hrs), Avg:97.7 F (36.5 C), Min:96.9 F (36.1 C), Max:98.5 F (36.9 C)  Recent Labs  Lab 01/28/23 1101 01/29/23 0130  WBC 4.6 5.7  CREATININE 1.28* 1.52*    Estimated Creatinine Clearance: 28.8 mL/min (A) (by C-G formula based on SCr of 1.52 mg/dL (H)).    Allergies  Allergen Reactions   Kiwi Extract Anaphylaxis   Gabapentin Nausea And Vomiting   Tdap [Tetanus-Diphth-Acell Pertussis] Swelling and Other (See Comments)    Swelling at injection site, gets very hot   Latex Itching, Dermatitis and Rash   Statins Other (See Comments)    RHABDOMYOLYSIS   Tramadol Nausea And Vomiting    Antimicrobials this admission: CRO 9/21 X1 Meropenem 9/22 >>  Microbiology results: 9/21 bcx: ngtd 9/21 ucx: klebsiella pneumonia  Thank you for allowing pharmacy to be a part of this patient's care.  Hershell Brandl 01/29/2023 2:22 PM

## 2023-01-29 NOTE — Assessment & Plan Note (Signed)
Thrombocytopenia History varices s/p TIPS History hepatic encephalopathy Ammonia normal now.  Platelets at baseline.  No abdominal ascites. - Continue home rifaximin and lactulose

## 2023-01-29 NOTE — Progress Notes (Addendum)
ANTICOAGULATION CONSULT NOTE - Initial Consult  Pharmacy Consult for heparin Indication: atrial fibrillation  Allergies  Allergen Reactions   Kiwi Extract Anaphylaxis   Gabapentin Nausea And Vomiting   Tdap [Tetanus-Diphth-Acell Pertussis] Swelling and Other (See Comments)    Swelling at injection site, gets very hot   Latex Itching, Dermatitis and Rash   Statins Other (See Comments)    RHABDOMYOLYSIS   Tramadol Nausea And Vomiting    Patient Measurements: Height: 5\' 3"  (160 cm) Weight: 55.3 kg (121 lb 14.6 oz) IBW/kg (Calculated) : 52.4 Heparin Dosing Weight: TBW  Vital Signs: Temp: 97.6 F (36.4 C) (09/22 0023) Temp Source: Oral (09/22 0023) BP: 108/71 (09/22 0023) Pulse Rate: 118 (09/22 0023)  Labs: Recent Labs    01/28/23 1101 01/28/23 1106 01/28/23 1304 01/29/23 0130  HGB 11.8* 12.6  --  12.4  HCT 38.0 37.0  --  39.4  PLT 103*  --   --  129*  HEPARINUNFRC  --   --   --  0.11*  CREATININE 1.28*  --   --   --   TROPONINIHS 103*  --  100*  --     Estimated Creatinine Clearance: 30.9 mL/min (A) (by C-G formula based on SCr of 1.28 mg/dL (H)).   Medical History: Past Medical History:  Diagnosis Date   Chronic cystitis    Chronic diastolic CHF (congestive heart failure) (HCC)    followed by dr Eden Emms   Chronic kidney disease, stage I    Cirrhosis of liver without ascites (HCC) 03/2016   followed by dr Christella Hartigan (GI);   due to fatty liver;   post op cabg 09/ 2017  , post paracentesis for ascites   Coronary artery disease 01/2016   cardiologist--- dr Estrella Myrtle;   01-27-2016  NSTEMI  s/p cardiac cath;   s/p CABG 01/29/16: LIMA-LAD, SVG-RI, SVG-dRCA   DDD (degenerative disc disease), cervical    per pt w/ chronic neck pain   Dyspnea    occasional   Edema of right lower extremity    Esophageal varices (HCC)    followed by dr Christella Hartigan;   hx multiple banding of large varices   Essential hypertension    no meds   GERD (gastroesophageal reflux disease)    Heart  murmur     DX FOR YEARS ASYMPTOMATIC   Hepatic encephalopathy (HCC)    History of adenomatous polyp of colon    History of COVID-19 04/2020   per pt mild  symptoms that resolve   History of kidney stones    passed stones   History of non-ST elevation myocardial infarction (NSTEMI) 01/27/2016   Hyperlipidemia    diet controlled   NAFLD (nonalcoholic fatty liver disease)    Neuropathy, peripheral 04/01/2015   OA (osteoarthritis)    Paroxysmal A-fib (HCC)    08/19/19 afib with RVR, spontaneously converted to SR   Portal vein thrombosis    per dr Christella Hartigan  ,  chronic non-occluded   Thrombocytopenia (HCC)    followed by pcp  and hematology-- dr Mosetta Putt;   likely secondary to liver cirrhosis   Type 2 diabetes mellitus treated with insulin (HCC)    Type 2, followed by pcp    (07-21-2021  per pt checks blood sugar dialy in am fasting average 113--200s)   Urge incontinence of urine    Wears hearing aid in both ears     Assessment: 77 YOF presenting with SOB, in afib RVR, hx of afib, she is not on anticoagulation PTA,  H/H wnl, plts 129. Pt with cirrhosis+ esophageal varices s/p TIPS   HL 0.11 - subtherapeutic  Goal of Therapy:  Heparin level 0.3-0.7 units/ml Monitor platelets by anticoagulation protocol: Yes   Plan:  Increase heparin to 900 units/hr F/u 8 hour heparin level F/u long term anticoag plans  Calton Dach, PharmD, BCCCP Clinical Pharmacist 01/29/2023 1:53 AM

## 2023-01-29 NOTE — Progress Notes (Signed)
Mobility Specialist Progress Note:    01/29/23 1129  Mobility  Activity Transferred to/from [facility]  Level of Assistance Minimal assist, patient does 75% or more  Assistive Device Other (Comment) (HHA)  Activity Response Tolerated well  $Mobility charge 1 Mobility  Mobility Specialist Start Time (ACUTE ONLY) 1100  Mobility Specialist Stop Time (ACUTE ONLY) 1117  Mobility Specialist Time Calculation (min) (ACUTE ONLY) 17 min   Pt received trying to get out of bed, bed alarm going off. Pt needed Mu=inA w/ bed mobility and STS. Had an episode of BM incontinence and needed assistance w/ pericare. RN preformed pericare while mobility specialist assisted in steadying. Returned to bed w/o fault. Left w/ RN in room and all needs met.  Thompson Grayer Mobility Specialist  Please contact vis Secure Chat or  Rehab Office [phone]

## 2023-01-29 NOTE — Progress Notes (Signed)
Progress Note   Patient: [name] [id number]:[id number] DOB: 10/11/1945 DOA: 01/28/2023     1 DOS: the patient was seen and examined on 01/29/2023 at 10:16AM      Brief hospital course: [name] is a 77 y.o. F with NASH cirrhosis s/p TIPS, c/b thrombocytopenia and hx variceal bleeding, hx SDH/SAH from fall, chronic respiratory failure on 1L O2, pAF not on AC due to bleeding risk, DM, dCHF, HTN, CAD s/p CABG 2017 x3, recurrent ESBL UTI, and diagnosed with COVID on 9/17 who presented with decreased mentation for a few days.  Diagnosed with COVID 1 week PTA, took/finished Paxlovid.  In the days leading up to admission, was less responsive, more confused and had leg swelling and cough.  In the ER, Afib with rates 120s.  UA with bacteria/nitrites.  Cr 1.2 from baseline 0.8.  Admitted for CHF, Afib, UTI.     Assessment and Plan: * Acute metabolic encephalopathy At baseline, has mild memory impairment, but interactive and appropriate.  Here, is disoriented and having difficulty following commands, sleepy.  Multifactorial from CHF, Afib, UTI, maybe COVID. - Start seroquel at night - Recruitment consultant    Atrial fibrillation with RVR (HCC) Rates still uncontrolled.  Given hx variceal bleeding and SDH/SAH from ground level fall, her cardiologist has recommended against OAC and we do as well.  Not DCCV candidate. - Stop heparin gtt - Increase new metoprolol    Acute on chronic heart failure with preserved ejection fraction (HFpEF) (HCC) Still edematous on exam, chest imaging with effusions, edema.  Increased O2 needs from baseline.  Echo last Dec normal EF - Continue IV Lasix, dosing per Cardiology - Strict I/Os, daily weights - Daily BMP - Continue spironolactone, metoprolol - Follow updated echo    Acute cystitis Urine culture growing 100K Klebsiella.  Hx multiple ESBL Klebsiella UTIs.   - Start meropenem, day 1 of 5    COVID-19 virus infection Dx'd 9/17.  Suspect her  symptoms are not from COVID.  Completed Paxlovid - Last day of isolation 9/26  Chronic respiratory failure with hypoxia (HCC) Acute respiratory failure ruled out.  Type 2 diabetes mellitus without complication (HCC) Glucosee controled - Hold glargine - Continue SS correction insulin  Coronary artery disease of native artery of native heart with stable angina pectoris (HCC) - Hold Zetia  Liver cirrhosis secondary to NASH (HCC) Thrombocytopenia History varices s/p TIPS History hepatic encephalopathy Ammonia normal now.  Platelets at baseline.  No abdominal ascites. - Continue home rifaximin and lactulose  Essential hypertension BP normal - Continue metoprolol, spironolactone, furosemide  Hyperlipidemia - Hold home Zetia          Subjective: Patient remains confused.  She still has swelling.  She reports no fever, malaise, just is weak and confused.  Nursing reports she pulled out multiple IVs overnight, was restless and trying to get out of bed, hard to redirect.     Physical Exam: BP 126/87 (BP Location: Right Arm)   Pulse (!) 107   Temp 97.7 F (36.5 C) (Oral)   Resp (!) 21   Ht 5\' 2"  (1.575 m)   Wt 69.8 kg   LMP  (LMP Unknown)   SpO2 98%   BMI 28.15 kg/m   Elderly adult female, lying in bed, appears chronically ill Tachycardic, regular, no murmurs, 2+ pitting edema to the knee Respiratory rate shallow, somewhat fast, lung sounds diminished Abdomen soft without ascites, focal tenderness palpation Attention diminished, affect blunted, oriented to hospital, self, not  month, year, or situation.  Generalized weakness but symmetric strength, speech fluent    Data Reviewed: Discussed with cardiology Platelets up to 129 on CBC Basic metabolic panel shows slight increase in creatinine   Family Communication: Husband by phone, son by phone, called to daughter, no answer    Disposition: Status is: Inpatient         Author: Alberteen Sam,  MD 01/29/2023 2:22 PM  For on call review [email or web address].

## 2023-01-29 NOTE — Progress Notes (Signed)
   01/29/23 1700  Assess: MEWS Score  BP (!) 122/93  MAP (mmHg) 103  Pulse Rate (!) 130  ECG Heart Rate (!) 125  Resp 17  Level of Consciousness Alert  SpO2 98 %  O2 Device Room Air  Assess: MEWS Score  MEWS Temp 0  MEWS Systolic 0  MEWS Pulse 2  MEWS RR 0  MEWS LOC 0  MEWS Score 2  MEWS Score Color Yellow  Assess: SIRS CRITERIA  SIRS Temperature  0  SIRS Pulse 1  SIRS Respirations  0  SIRS WBC 0  SIRS Score Sum  1

## 2023-01-29 NOTE — Assessment & Plan Note (Signed)
-  Hold Zetia ?

## 2023-01-29 NOTE — Assessment & Plan Note (Signed)
BP normal - Continue metoprolol, spironolactone, furosemide

## 2023-01-29 NOTE — Assessment & Plan Note (Addendum)
Urine culture with ESBL - Continue meropenem, day 2 of 5

## 2023-01-30 ENCOUNTER — Inpatient Hospital Stay (HOSPITAL_COMMUNITY): Payer: Medicare Other

## 2023-01-30 DIAGNOSIS — I4819 Other persistent atrial fibrillation: Secondary | ICD-10-CM | POA: Diagnosis not present

## 2023-01-30 DIAGNOSIS — I8500 Esophageal varices without bleeding: Secondary | ICD-10-CM | POA: Diagnosis not present

## 2023-01-30 DIAGNOSIS — I5021 Acute systolic (congestive) heart failure: Secondary | ICD-10-CM

## 2023-01-30 DIAGNOSIS — M7989 Other specified soft tissue disorders: Secondary | ICD-10-CM

## 2023-01-30 DIAGNOSIS — G9341 Metabolic encephalopathy: Secondary | ICD-10-CM | POA: Diagnosis not present

## 2023-01-30 LAB — BASIC METABOLIC PANEL
Anion gap: 11 (ref 5–15)
BUN: 35 mg/dL — ABNORMAL HIGH (ref 8–23)
CO2: 21 mmol/L — ABNORMAL LOW (ref 22–32)
Calcium: 9 mg/dL (ref 8.9–10.3)
Chloride: 102 mmol/L (ref 98–111)
Creatinine, Ser: 1.72 mg/dL — ABNORMAL HIGH (ref 0.44–1.00)
GFR, Estimated: 30 mL/min — ABNORMAL LOW (ref >60)
Glucose, Bld: 118 mg/dL — ABNORMAL HIGH (ref 70–99)
Potassium: 5.7 mmol/L — ABNORMAL HIGH (ref 3.5–5.1)
Sodium: 134 mmol/L — ABNORMAL LOW (ref 135–145)

## 2023-01-30 LAB — CBC
HCT: 36.3 % (ref 36.0–46.0)
Hemoglobin: 11.3 g/dL — ABNORMAL LOW (ref 12.0–15.0)
MCH: 29.3 pg (ref 26.0–34.0)
MCHC: 31.1 g/dL (ref 30.0–36.0)
MCV: 94 fL (ref 80.0–100.0)
Platelets: 107 10*3/uL — ABNORMAL LOW (ref 150–400)
RBC: 3.86 MIL/uL — ABNORMAL LOW (ref 3.87–5.11)
RDW: 17.9 % — ABNORMAL HIGH (ref 11.5–15.5)
WBC: 4.4 10*3/uL (ref 4.0–10.5)
nRBC: 0 % (ref 0.0–0.2)

## 2023-01-30 LAB — GLUCOSE, CAPILLARY
Glucose-Capillary: 100 mg/dL — ABNORMAL HIGH (ref 70–99)
Glucose-Capillary: 136 mg/dL — ABNORMAL HIGH (ref 70–99)
Glucose-Capillary: 165 mg/dL — ABNORMAL HIGH (ref 70–99)
Glucose-Capillary: 200 mg/dL — ABNORMAL HIGH (ref 70–99)

## 2023-01-30 MED ORDER — CHLORHEXIDINE GLUCONATE CLOTH 2 % EX PADS
6.0000 | MEDICATED_PAD | Freq: Every day | CUTANEOUS | Status: DC
  Administered 2023-01-30 – 2023-02-04 (×5): 6 via TOPICAL

## 2023-01-30 NOTE — Progress Notes (Signed)
Progress Note   [name]: [name] JXB:[id number] DOB: 25-Jan-1946 DOA: 01/28/2023     2 DOS: the [name] was seen and examined on 01/30/2023 at 1:01PM      Brief hospital course: [name] is a 77 y.o. F with NASH cirrhosis s/p TIPS, c/b thrombocytopenia and hx variceal bleeding, hx SDH/SAH from fall, chronic respiratory failure on 1L O2, pAF not on AC due to bleeding risk, DM, dCHF, HTN, CAD s/p CABG 2017 x3, recurrent ESBL UTI, and diagnosed with COVID on 9/17 who presented with decreased mentation for a few days.  Diagnosed with COVID 1 week PTA, took/finished Paxlovid.  In the days leading up to admission, was less responsive, more confused and had leg swelling and cough.  In the ER, Afib with rates 120s.  UA with bacteria/nitrites.  Cr 1.2 from baseline 0.8.  Admitted for CHF, Afib, UTI.     Assessment and Plan: * Acute metabolic encephalopathy At baseline, has mild memory impairment, but interactive and appropriate.  Here, is disoriented and having difficulty following commands, sleepy.  Multifactorial from CHF, Afib, UTI, maybe COVID. - Continue new low dose seroquel at night, wean if too sleepy    Atrial fibrillation with RVR (HCC) Rates mostly 90s-100s last 24 hours.  Given hx variceal bleeding and SDH/SAH from ground level fall, [name] cardiologist has recommended against OAC and we do as well.  Not DCCV candidate. - Continue metoprolol    Acute on chronic HFrEF (heart failure with reduced ejection fraction) (HCC) Prior EF normal.    EF now here down to 25-30%.  Tachycardia induced vs diffuse ischemia  Cr up today - Hold diuretics - Strict I/Os, daily weights - Daily BMP - Continue metoprolol - Hold spironolactone given hyperkalemia     Acute cystitis Urine culture with ESBL - Continue meropenem, day 2 of 5    COVID-19 virus infection Dx'd 9/17.  Suspect [name] symptoms are not from COVID.  Completed Paxlovid - Last day of isolation 9/26  Leg  swelling Has asymmetric edema and hx DVT and prior IVC filter, now removed. - Obtain US LE  Chronic respiratory failure with hypoxia (HCC) Acute respiratory failure ruled out.  Type 2 diabetes mellitus without complication (HCC) Glucose normal - Hold glargine - Continue SS correction insulin  Coronary artery disease of native artery of native heart with stable angina pectoris (HCC) - Hold Zetia  Liver cirrhosis secondary to NASH (HCC) Thrombocytopenia History varices s/p TIPS History hepatic encephalopathy Ammonia normal now.  Platelets at baseline.  No abdominal ascites. - Continue home rifaximin and lactulose  Essential hypertension BP normal - Continue metoprolol  - Hold spironolactone, furoesmide  Hyperlipidemia - Hold home Zetia          Subjective: [name] is very sleepy with me, oriented to self, hospital, time of day, but disoriented to situation, overnight was not cooperative with cares, pulled out IV.  Today seemed more cooperative.       Physical Exam: BP 100/70 (BP Location: Right Arm)   Pulse 90   Temp 97.9 F (36.6 C) (Oral)   Resp 17   Ht 5\' 2"  (1.575 m)   Wt 69.8 kg   LMP  (LMP Unknown)   SpO2 97%   BMI 28.15 kg/m   Elderly adult female, lying in bed, appears chronically ill and tired Tachycardic, irregular, 2+ pitting edema in the ankle only Respiratory rate shallow, lung sounds diminished, no rales Abdomen soft without ascites, no focal tenderness to palpation Attention diminished,  falls back asleep, generalized weakness but symmetric, speech fluent    Data Reviewed: Discussed with cardiology Creatinine 1.5 Basic metabolic panel CBC shows normal white count, platelets stable at 107 Echocardiogram report reviewed, EF down to 25%, wall motion abnormalities noted  Family Communication: None present    Disposition: Status is: Inpatient         Author: Alberteen Sam, MD 01/30/2023 4:40 PM  For on call review  [email or web address].

## 2023-01-30 NOTE — Assessment & Plan Note (Signed)
Has asymmetric edema and hx DVT and prior IVC filter, now removed. - Obtain US LE

## 2023-01-30 NOTE — Progress Notes (Signed)
   Patient Name: [name] Date of Encounter: 01/30/2023 [facility] Cardiologist: Charlton Haws, MD   Interval Summary  .    Very groggy, can be awoken but does not appear to be fully oriented. Mildly hypotensive.  Vital Signs .    Vitals:   01/30/23 0000 01/30/23 0102 01/30/23 0357 01/30/23 0844  BP:  (!) 88/64 90/64 101/79  Pulse:  (!) 134 96 (!) 102  Resp: 12 16 14    Temp:  99.5 F (37.5 C) 98.7 F (37.1 C)   TempSrc:  Axillary Axillary   SpO2:  100% 100%   Weight:      Height:        Intake/Output Summary (Last 24 hours) at 01/30/2023 0942 Last data filed at 01/29/2023 2307 Gross per 24 hour  Intake --  Output 900 ml  Net -900 ml      01/29/2023    6:21 AM 01/28/2023   10:41 AM 10/13/2022    6:17 AM  Last 3 Weights  Weight (lbs) 153 lb 14.1 oz 121 lb 14.6 oz 122 lb  Weight (kg) 69.8 kg 55.3 kg 55.339 kg      Telemetry/ECG    Atrial fibrillation with controlled ventricular response- Personally Reviewed  Physical Exam .   GEN: No acute distress.   Neck: 8-9 cm JVD Cardiac: Irregular, no murmurs, rubs, or gallops.  Bilateral 3+ soft pitting edema of the lower extremities to the knees Respiratory: Clear to auscultation bilaterally. GI: Soft, nontender, non-distended  MS: No edema  Assessment & Plan .     Persistent atrial fibrillation: improved rate control.  Not anticoagulated due to high bleeding risk from esophageal varices and thrombocytopenia, in the setting of cirrhosis due to NASH. CHF: New marked decrease in left ventricular systolic function with global hypokinesis.  Differential diagnosis includes recurrent widespread coronary insufficiency versus tachycardia related cardiomyopathy.  I do not think she is clinically ready to undergo cardiac catheterization/coronary angiography.  Blood pressure does not allow Arni/ARB/ACE inhibitor/higher doses of aldosterone antagonist.  Will focus on rate control with beta-blockers and diuretics  and repeat the echo in a few weeks.  If LV dysfunction persists, would need coronary angiography.  Note worsening renal function parameters today.  Will have to slow down the diuretics. Encephalopathy: Multifactorial due to recent viral infection superimposed on cirrhosis.  On rifaximin and lactulose chronically. CAD s/p CABG: Now roughly 7 years status post multivessel bypass surgery.  Has not had angina pectoris. Cirrhosis: History of NASH and portal vein thrombosis status post TIPS; very likely a major contributor to her anasarca and altered mental status.  We have not checked LFTs during this admission.  Will order c-Met for tomorrow morning.  For questions or updates, please contact [facility] Please consult [email or web address] for contact info under        Signed, Thurmon Fair, MD

## 2023-01-30 NOTE — Plan of Care (Signed)
Problem: Education: Goal: Ability to verbalize activity precautions or restrictions will improve Outcome: Progressing Goal: Knowledge of the prescribed therapeutic regimen will improve Outcome: Progressing Goal: Understanding of discharge needs will improve Outcome: Progressing   Problem: Activity: Goal: Ability to avoid complications of mobility impairment will improve Outcome: Progressing

## 2023-01-30 NOTE — Progress Notes (Signed)
CSW confirmed with French Ana from [facility] that pt is LTC at [facility]. Pt will need to be out of restraints for 24 hrs prior to return to [facility].

## 2023-01-30 NOTE — Progress Notes (Signed)
Lower extremity venous duplex completed. Please see CV Procedures for preliminary results.  Shona Simpson, RVT 01/30/23 4:32 PM

## 2023-01-31 DIAGNOSIS — G9341 Metabolic encephalopathy: Secondary | ICD-10-CM | POA: Diagnosis not present

## 2023-01-31 DIAGNOSIS — I5033 Acute on chronic diastolic (congestive) heart failure: Secondary | ICD-10-CM | POA: Diagnosis not present

## 2023-01-31 DIAGNOSIS — I4891 Unspecified atrial fibrillation: Secondary | ICD-10-CM | POA: Diagnosis not present

## 2023-01-31 DIAGNOSIS — U071 COVID-19: Secondary | ICD-10-CM | POA: Diagnosis not present

## 2023-01-31 LAB — GLUCOSE, CAPILLARY
Glucose-Capillary: 184 mg/dL — ABNORMAL HIGH (ref 70–99)
Glucose-Capillary: 223 mg/dL — ABNORMAL HIGH (ref 70–99)
Glucose-Capillary: 252 mg/dL — ABNORMAL HIGH (ref 70–99)
Glucose-Capillary: 212 mg/dL — ABNORMAL HIGH (ref 70–99)

## 2023-01-31 LAB — BASIC METABOLIC PANEL
Anion gap: 5 (ref 5–15)
BUN: 44 mg/dL — ABNORMAL HIGH (ref 8–23)
CO2: 26 mmol/L (ref 22–32)
Calcium: 8.9 mg/dL (ref 8.9–10.3)
Chloride: 105 mmol/L (ref 98–111)
Creatinine, Ser: 1.76 mg/dL — ABNORMAL HIGH (ref 0.44–1.00)
GFR, Estimated: 30 mL/min — ABNORMAL LOW (ref >60)
Glucose, Bld: 265 mg/dL — ABNORMAL HIGH (ref 70–99)
Potassium: 4.6 mmol/L (ref 3.5–5.1)
Sodium: 136 mmol/L (ref 135–145)

## 2023-01-31 LAB — CBC
HCT: 35.4 % — ABNORMAL LOW (ref 36.0–46.0)
Hemoglobin: 11.3 g/dL — ABNORMAL LOW (ref 12.0–15.0)
MCH: 30.4 pg (ref 26.0–34.0)
MCHC: 31.9 g/dL (ref 30.0–36.0)
MCV: 95.2 fL (ref 80.0–100.0)
Platelets: 100 10*3/uL — ABNORMAL LOW (ref 150–400)
RBC: 3.72 MIL/uL — ABNORMAL LOW (ref 3.87–5.11)
RDW: 17.8 % — ABNORMAL HIGH (ref 11.5–15.5)
WBC: 3.6 10*3/uL — ABNORMAL LOW (ref 4.0–10.5)
nRBC: 0 % (ref 0.0–0.2)

## 2023-01-31 LAB — HEPATIC FUNCTION PANEL
ALT: 34 U/L (ref 0–44)
AST: 63 U/L — ABNORMAL HIGH (ref 15–41)
Albumin: 2.2 g/dL — ABNORMAL LOW (ref 3.5–5.0)
Alkaline Phosphatase: 119 U/L (ref 38–126)
Bilirubin, Direct: 0.8 mg/dL — ABNORMAL HIGH (ref 0.0–0.2)
Indirect Bilirubin: 1 mg/dL — ABNORMAL HIGH (ref 0.3–0.9)
Total Bilirubin: 1.8 mg/dL — ABNORMAL HIGH (ref 0.3–1.2)
Total Protein: 5.6 g/dL — ABNORMAL LOW (ref 6.5–8.1)

## 2023-01-31 LAB — URINE CULTURE: Culture: >=100000 — AB

## 2023-01-31 MED ORDER — INSULIN GLARGINE-YFGN 100 UNIT/ML ~~LOC~~ SOLN
10.0000 [IU] | Freq: Two times a day (BID) | SUBCUTANEOUS | Status: DC
  Administered 2023-01-31 – 2023-02-07 (×14): 10 [IU] via SUBCUTANEOUS
  Filled 2023-01-31 (×16): qty 0.1

## 2023-01-31 NOTE — Consult Note (Signed)
[facility]  [facility] Inpatient Consult   01/31/2023  [name] June 30, 1945 [id number]  [facility] [[facility]]  Accountable Care Organization [ACO] Patient: Medicare ACO REACH  Primary Care Provider:  Venita Sheffield, MD, with [facility], facility  Patient was reviewed for readmission extreme high risk score.  Patient was screened for hospitalization and on behalf of [facility] /[facility] Care Coordination to assess for post hospital community care needs.  Patient is long term care at skilled nursing facility and to return to that level of care for post hospital transition.  Plan:   No needs noted at present review as needs for post hospital care remains for LTC facility.  For questions or referrals, please contact:  Charlesetta Shanks, RN, BSN, CCM [place]  [facility], [facility] Liaison Direct Dial: [phone] or secure chat Website: [name]

## 2023-01-31 NOTE — Plan of Care (Signed)
Problem: Education: Goal: Ability to verbalize activity precautions or restrictions will improve Outcome: Progressing Goal: Knowledge of the prescribed therapeutic regimen will improve Outcome: Progressing Goal: Understanding of discharge needs will improve Outcome: Progressing

## 2023-01-31 NOTE — Plan of Care (Signed)
  Problem: Education: Goal: Ability to verbalize activity precautions or restrictions will improve Outcome: Progressing   Problem: Activity: Goal: Ability to avoid complications of mobility impairment will improve Outcome: Progressing   Problem: Pain Management: Goal: Pain level will decrease Outcome: Progressing   Problem: Skin Integrity: Goal: Will show signs of wound healing Outcome: Progressing   Problem: Clinical Measurements: Goal: Ability to maintain clinical measurements within normal limits will improve Outcome: Progressing

## 2023-01-31 NOTE — Progress Notes (Signed)
   Patient Name: [name] Date of Encounter: 01/31/2023 [facility] Cardiologist: Charlton Haws, MD   Interval Summary  .    Still sleepy and disoriented. AFib well rate controlled. Weight is 151 lb, roughly 25 lb above her average eight this year per chart.  Vital Signs .    Vitals:   01/31/23 0008 01/31/23 0339 01/31/23 0407 01/31/23 0807  BP: 104/66 118/69  92/75  Pulse: 91 94    Resp: 15 17    Temp: 97.6 F (36.4 C) 97.7 F (36.5 C)    TempSrc: Oral Oral    SpO2: 100% 94%    Weight:   68.6 kg   Height:        Intake/Output Summary (Last 24 hours) at 01/31/2023 0908 Last data filed at 01/31/2023 0339 Gross per 24 hour  Intake 264 ml  Output 900 ml  Net -636 ml      01/31/2023    4:07 AM 01/29/2023    6:21 AM 01/28/2023   10:41 AM  Last 3 Weights  Weight (lbs) 151 lb 3.8 oz 153 lb 14.1 oz 121 lb 14.6 oz  Weight (kg) 68.6 kg 69.8 kg 55.3 kg      Telemetry/ECG    AFib, rate controlled. - Personally Reviewed  Physical Exam .   GEN: No acute distress.   Neck: No JVD Cardiac: irregular, no murmurs, rubs, or gallops.  Respiratory: Clear to auscultation bilaterally. GI: Soft, nontender, non-distended  MS: No edema  Assessment & Plan .   Persistent atrial fibrillation CHF, newly decreased LVEF CAD s/p CABG 2017 Cirrhosis/NASH Encephalopathy Recent COVID-19 infection  AFib now is well rate controlled.Unfortunately, her comorbid conditions limit cardiac therapies: - She is clearly hypervolemic (by as much as 25 lb), but unable to diurese due to hypotension and renal dysfunction. Afraid that pushing diuretics could precipitate hepatorenal syndrome (or cardiorenal sd.). - Thankfully, the excess fluid is primarily in her lower extremities and abdomen and there is no evidence of acutely decompensated left heart failure/respiratory impairment. - No room for guideline-based HF meds for the same reason. Hopefully, rate control will allow LVEF  recovery, if this is primarily tachycardia-related cardiomyopathy. - Defer coronary angiography until mental status and renal parameters improve.   For questions or updates, please contact [facility] Please consult [email or web address] for contact info under        Signed, Thurmon Fair, MD

## 2023-01-31 NOTE — Progress Notes (Signed)
Progress Note   Patient: [name] VHQ:[id number] DOB: 1945-06-27 DOA: 01/28/2023     3 DOS: the patient was seen and examined on 01/31/2023 at 9:50 AM and 4:20 PM      Brief hospital course: [name] is a 77 y.o. F with NASH cirrhosis s/p TIPS, c/b thrombocytopenia and hx variceal bleeding, hx SDH/SAH from fall, chronic respiratory failure on 1L O2, pAF not on AC due to bleeding risk, DM, dCHF, HTN, CAD s/p CABG 2017 x3, recurrent ESBL UTI, and diagnosed with COVID on 9/17 who presented with decreased mentation for a few days.  Diagnosed with COVID 1 week PTA, took/finished Paxlovid.  In the days leading up to admission, was less responsive, more confused and had leg swelling and cough.  In the ER, Afib with rates 120s.  UA with bacteria/nitrites.  Cr 1.2 from baseline 0.8.  Admitted for CHF, Afib, UTI.     Assessment and Plan: * Acute metabolic encephalopathy At baseline, has mild memory impairment, but interactive and appropriate.  Here, is disoriented and having difficulty following commands, sleepy.  Multifactorial from CHF, Afib, UTI, maybe COVID. Given her atrial fibrillation and persistent encephalopathy, wonder about emboli to the brain. -Stop Seroquel - Obtain MRI brain    Atrial fibrillation with RVR (HCC) Rates mostly 80s and 90s last 24 hours.  Given hx variceal bleeding and SDH/SAH from ground level fall, her cardiologist has recommended against OAC and we do as well.  Not DCCV candidate. - Continue metoprolol -Consult cardiology   Acute on chronic HFrEF (heart failure with reduced ejection fraction) (HCC) Prior EF normal.    EF now here down to 25-30%.  Tachycardia induced vs diffuse ischemia  Creatinine unchanged off diuretics.  Agree with cardiology, appears fluid overloaded but pushing further diuretics may be detrimental.  Hopefully rate control will improve her CO. - Hold diuretics - Strict I/Os, daily weights - Daily BMP - Continue  metoprolol - Hold spironolactone given hyperkalemia     Acute cystitis Urine culture with ESBL - Continue meropenem, day 3 of 5    COVID-19 virus infection Dx'd 9/17.  Suspect her symptoms are not from COVID.  Completed Paxlovid - Last day of isolation 9/26, can come off 9/27  Leg swelling DVT ruled out  Chronic respiratory failure with hypoxia (HCC) Uses 2L O2 for the last few months.  Acute respiratory failure ruled out.  Type 2 diabetes mellitus without complication (HCC) Glucose elevated - Resume glargine, lower than home dose - Continue SS correction insulin  Coronary artery disease of native artery of native heart with stable angina pectoris (HCC) - Hold Zetia  Liver cirrhosis secondary to NASH (HCC) Thrombocytopenia History varices s/p TIPS History hepatic encephalopathy Ammonia normal now.  Platelets at baseline.  No abdominal ascites. - Continue home rifaximin and lactulose  Essential hypertension BP normal - Continue metoprolol  - Hold spironolactone, furoesmide  Hyperlipidemia - Hold home Zetia  Pancytopenia Due to covid       Subjective: Patient is sedated and still very sleepy.  No fever, no respiratory distress.     Physical Exam: BP 108/73   Pulse 89   Temp 97.7 F (36.5 C) (Oral)   Resp 17   Ht 5\' 2"  (1.575 m)   Wt 68.6 kg   LMP  (LMP Unknown)   SpO2 100%   BMI 27.66 kg/m   Elderly adult female, appears extremely debilitated and somewhat swollen Irregularly irregular, slightly elevated rhythm, no significant murmurs, seems to have  pitting edema in the periphery Respiratory effort weak, lung sounds diminished Abdomen without grimace to palpation or guarding Attention diminished, oriented to self, family, but severe psychomotor slowing and a lot of weakness, severe generalized weakness, symmetric, speech fluent    Data Reviewed: Basic metabolic panel shows creatinine 1.76, no change, sodium potassium normal Mild  pancytopenia Lower extremity ultrasound without clot  Family Communication: Called the son [name], no answer, left voicemail.  Other son and husband at the bedside.    Disposition: Status is: Inpatient The patient was admitted for encephalopathy  This appears to be multifactorial  We will treat her atrial fibrillation, urinary tract infection, which will need 5 days at least of meropenem.  If her MRI is normal, she completes IV antibiotics, and she is able to remain out of restraints and her oral intake is okay, hopefully home to long-term care [facility] by this weekend        Author: Alberteen Sam, MD 01/31/2023 5:23 PM  For on call review [email or web address].

## 2023-02-01 ENCOUNTER — Inpatient Hospital Stay (HOSPITAL_COMMUNITY): Payer: Medicare Other

## 2023-02-01 DIAGNOSIS — G9341 Metabolic encephalopathy: Secondary | ICD-10-CM | POA: Diagnosis not present

## 2023-02-01 LAB — CBC
HCT: 37.2 % (ref 36.0–46.0)
Hemoglobin: 11.4 g/dL — ABNORMAL LOW (ref 12.0–15.0)
MCH: 29.8 pg (ref 26.0–34.0)
MCHC: 30.6 g/dL (ref 30.0–36.0)
MCV: 97.4 fL (ref 80.0–100.0)
Platelets: 85 10*3/uL — ABNORMAL LOW (ref 150–400)
RBC: 3.82 MIL/uL — ABNORMAL LOW (ref 3.87–5.11)
RDW: 17.7 % — ABNORMAL HIGH (ref 11.5–15.5)
WBC: 3.8 10*3/uL — ABNORMAL LOW (ref 4.0–10.5)
nRBC: 0 % (ref 0.0–0.2)

## 2023-02-01 LAB — GLUCOSE, CAPILLARY
Glucose-Capillary: 149 mg/dL — ABNORMAL HIGH (ref 70–99)
Glucose-Capillary: 166 mg/dL — ABNORMAL HIGH (ref 70–99)
Glucose-Capillary: 198 mg/dL — ABNORMAL HIGH (ref 70–99)
Glucose-Capillary: 171 mg/dL — ABNORMAL HIGH (ref 70–99)
Glucose-Capillary: 191 mg/dL — ABNORMAL HIGH (ref 70–99)

## 2023-02-01 LAB — BASIC METABOLIC PANEL
Anion gap: 8 (ref 5–15)
BUN: 29 mg/dL — ABNORMAL HIGH (ref 8–23)
CO2: 24 mmol/L (ref 22–32)
Calcium: 8.9 mg/dL (ref 8.9–10.3)
Chloride: 106 mmol/L (ref 98–111)
Creatinine, Ser: 1.2 mg/dL — ABNORMAL HIGH (ref 0.44–1.00)
GFR, Estimated: 47 mL/min — ABNORMAL LOW (ref >60)
Glucose, Bld: 207 mg/dL — ABNORMAL HIGH (ref 70–99)
Potassium: 4.2 mmol/L (ref 3.5–5.1)
Sodium: 138 mmol/L (ref 135–145)

## 2023-02-01 MED ORDER — LORAZEPAM 2 MG/ML IJ SOLN
0.5000 mg | Freq: Once | INTRAMUSCULAR | Status: AC | PRN
  Administered 2023-02-01: 0.5 mg via INTRAVENOUS
  Filled 2023-02-01: qty 1

## 2023-02-01 NOTE — Progress Notes (Signed)
Verbal order to renew telesitter for the patient from [facility] MD.

## 2023-02-01 NOTE — Progress Notes (Addendum)
PROGRESS NOTE    [name]  ZOX:[id number] DOB: 06/15/1945 DOA: 01/28/2023 PCP: Venita Sheffield, MD   Brief Narrative: [name] is a 77 y.o. female with a history of NASH cirrhosis, portal hypertensive gastropathy status post TIPS, thrombocytopenia, chronic respiratory failure, paroxysmal atrial fibrillation, diabetes mellitus type 2, diastolic heart failure, hypertension, CAD status post CABG, recurrent ESBL UTI.  Patient presented secondary to worsening confusion.  Patient found to be in atrial fibrillation with RVR in addition to having evidence of recurrent urinary tract infection.  Patient started on antibiotics for treatment and atrial fibrillation managed with metoprolol with control of high rates.  Hospitalization complicated by continued/recurrent altered mental status.   Assessment and Plan:  Acute metabolic encephalopathy Presumed multifactorial secondary to UTI and possibly COVID 19 infection.  Patient did have some improvement in mental status which seems to have worsened today per family. Possibly related to Seroquel. -Watch mental status; if no improvement, may consider checking a blood gas -MRI brain pending -Continue telesitter -Stop Seroquel  Atrial fibrillation with RVR Not on anticoagulation per cardiology recommendations with history of variceal bleeding in addition to subdural and subarachnoid hemorrhage history. -Continue metoprolol  Acute combined systolic and diastolic heart failure Echocardiogram this admission significant for an LVEF of 25 to 30%.  Cardiology is consulted.  Secondary to comorbidities, recommendation for no left heart cath per cardiology.  Ability to diuresis complicated by hypotension and AKI. -Continue metoprolol  AKI Baseline creatinine of about 0.7-0.8. Creatinine of 1.28 on admission with peak of 1.76. Improved. Back down to 1.20.  UTI secondary to ESBL Klebsiella pneumoniae -Continue meropenem to  complete a 5 day course  COVID-19 infection Patient was treated with Paxlovid and completed course. Last day of isolation is 9/27.  Leg swelling LE venous duplex negative for acute DVT.  Chronic respiratory failure with hypoxia -Continue supplemental oxygen  Diabetes mellitus, type 2 Uncontrolled with hyperglycemia from most recent hemoglobin A1C of 8.8% -Continue Semglee 10 units BID and SSI  CAD Stable.  Liver cirrhosis secondary to NASH Esophageal varices/portal hypertension s/p TIPS Spironolactone and furosemide held.  Hepatic encephalopathy -Continue lactulose and rifaximin  Primary hypertension -Continue metoprolol  Hyperlipidemia Zetia held  Pancytopenia Likely related to acute illness. -CBC with differential in AM  Goals of care Overall, prognosis seems poor.  Patient has underlying cognitive impairment and currently resides at long-term care.  Patient with new diagnosis of severe cardiomyopathy of unknown etiology at this time.  Patient with recurrent ESBL organism UTIs. Concern for patient's quality of life at this time. -Palliative care consult for goals of care  Pressure injury Mid sacrum.  Present on admission.   DVT prophylaxis: Lovenox Code Status:   Code Status: Limited: Do not attempt resuscitation (DNR) -DNR-LIMITED -Do Not Intubate/DNI  Family Communication: Husband, daughter and grandson at bedside Disposition Plan: Discharge possibly back to long term care depending on goals of care discussions   Consultants:  Cardiology  Procedures:  9/22: Transthoracic Echocardiogram   Antimicrobials: Meropenem    Subjective: Patient without significant concerns today.  Per daughter, non there was concern that patient is more somnolent today.  Daughter reports that yesterday patient had a much better day and was more alert.  Objective: BP 123/75 (BP Location: Right Arm)   Pulse 93   Temp 97.6 F (36.4 C) (Oral)   Resp 16   Ht 5\' 2"  (1.575 m)    Wt 68 kg   LMP  (LMP Unknown)   SpO2 96%  BMI 27.42 kg/m   Examination:  General exam: Appears calm and comfortable Respiratory system: Clear to auscultation. Respiratory effort normal. Cardiovascular system: S1 & S2 heard. Gastrointestinal system: Abdomen is nondistended, soft and nontender. Normal bowel sounds heard. Central nervous system: Somnolent.  Oriented to person and that she is in a hospital.    Data Reviewed: I have personally reviewed following labs and imaging studies  CBC Lab Results  Component Value Date   WBC 3.8 (L) 02/01/2023   RBC 3.82 (L) 02/01/2023   HGB 11.4 (L) 02/01/2023   HCT 37.2 02/01/2023   MCV 97.4 02/01/2023   MCH 29.8 02/01/2023   PLT 85 (L) 02/01/2023   MCHC 30.6 02/01/2023   RDW 17.7 (H) 02/01/2023   LYMPHSABS 0.4 (L) 01/28/2023   MONOABS 0.3 01/28/2023   EOSABS 0.1 01/28/2023   BASOSABS 0.0 01/28/2023     Last metabolic panel Lab Results  Component Value Date   NA 138 02/01/2023   K 4.2 02/01/2023   CL 106 02/01/2023   CO2 24 02/01/2023   BUN 29 (H) 02/01/2023   CREATININE 1.20 (H) 02/01/2023   GLUCOSE 207 (H) 02/01/2023   GFRNONAA 47 (L) 02/01/2023   GFRAA 86 10/13/2020   CALCIUM 8.9 02/01/2023   PHOS 2.7 09/09/2022   PROT 5.6 (L) 01/31/2023   ALBUMIN 2.2 (L) 01/31/2023   LABGLOB 3.5 07/24/2015   AGRATIO 1.1 (L) 07/24/2015   BILITOT 1.8 (H) 01/31/2023   ALKPHOS 119 01/31/2023   AST 63 (H) 01/31/2023   ALT 34 01/31/2023   ANIONGAP 8 02/01/2023    GFR: Estimated Creatinine Clearance: 36.1 mL/min (A) (by C-G formula based on SCr of 1.2 mg/dL (H)).  Recent Results (from the past 240 hour(s))  Blood culture (routine x 2)     Status: None (Preliminary result)   Collection Time: 01/28/23 11:01 AM   Specimen: BLOOD RIGHT ARM  Result Value Ref Range Status   Specimen Description BLOOD RIGHT ARM  Final   Special Requests   Final    BOTTLES DRAWN AEROBIC AND ANAEROBIC Blood Culture adequate volume   Culture   Final     NO GROWTH 4 DAYS Performed at [facility], [address], [place], [place] [address]    Report Status PENDING  Incomplete  Blood culture (routine x 2)     Status: None (Preliminary result)   Collection Time: 01/28/23 11:02 AM   Specimen: BLOOD LEFT ARM  Result Value Ref Range Status   Specimen Description BLOOD LEFT ARM  Final   Special Requests   Final    BOTTLES DRAWN AEROBIC AND ANAEROBIC Blood Culture adequate volume   Culture   Final    NO GROWTH 4 DAYS Performed at [facility], [address] [place], [place] [address]    Report Status PENDING  Incomplete  Urine Culture     Status: Abnormal   Collection Time: 01/28/23 11:12 AM   Specimen: Urine, Clean Catch  Result Value Ref Range Status   Specimen Description URINE, CLEAN CATCH  Final   Special Requests   Final    NONE Performed at [facility], [address] [place], [place] [address]    Culture (A)  Final    >=100,000 COLONIES/mL KLEBSIELLA PNEUMONIAE Confirmed Extended Spectrum Beta-Lactamase Producer (ESBL).  In bloodstream infections from ESBL organisms, carbapenems are preferred over piperacillin/tazobactam. They are shown to have a lower risk of mortality. 30,000 COLONIES/mL KLEBSIELLA OXYTOCA    Report Status  01/31/2023 FINAL  Final   Organism ID, Bacteria KLEBSIELLA PNEUMONIAE (A)  Final   Organism ID, Bacteria KLEBSIELLA OXYTOCA (A)  Final      Susceptibility   Klebsiella oxytoca - MIC*    AMPICILLIN >=32 RESISTANT Resistant     CEFEPIME <=0.12 SENSITIVE Sensitive     CEFTRIAXONE <=0.25 SENSITIVE Sensitive     CIPROFLOXACIN <=0.25 SENSITIVE Sensitive     GENTAMICIN <=1 SENSITIVE Sensitive     IMIPENEM <=0.25 SENSITIVE Sensitive     NITROFURANTOIN 64 INTERMEDIATE Intermediate     TRIMETH/SULFA <=20 SENSITIVE Sensitive     AMPICILLIN/SULBACTAM 8 SENSITIVE Sensitive     PIP/TAZO <=4 SENSITIVE Sensitive     * 30,000 COLONIES/mL KLEBSIELLA OXYTOCA   Klebsiella pneumoniae - MIC*     AMPICILLIN >=32 RESISTANT Resistant     CEFAZOLIN >=64 RESISTANT Resistant     CEFEPIME >=32 RESISTANT Resistant     CEFTRIAXONE >=64 RESISTANT Resistant     CIPROFLOXACIN 2 RESISTANT Resistant     GENTAMICIN >=16 RESISTANT Resistant     IMIPENEM <=0.25 SENSITIVE Sensitive     NITROFURANTOIN 64 INTERMEDIATE Intermediate     TRIMETH/SULFA >=320 RESISTANT Resistant     AMPICILLIN/SULBACTAM >=32 RESISTANT Resistant     PIP/TAZO 8 SENSITIVE Sensitive     * >=100,000 COLONIES/mL KLEBSIELLA PNEUMONIAE      Radiology Studies: No results found.    LOS: 4 days    Jacquelin Hawking, MD [facility] 02/01/2023, 5:05 PM   If 7PM-7AM, please contact night-coverage [email or web address]

## 2023-02-01 NOTE — Plan of Care (Signed)
Problem: Education: Goal: Ability to verbalize activity precautions or restrictions will improve Outcome: Progressing Goal: Knowledge of the prescribed therapeutic regimen will improve Outcome: Progressing Goal: Understanding of discharge needs will improve Outcome: Progressing   Problem: Activity: Goal: Ability to avoid complications of mobility impairment will improve Outcome: Progressing

## 2023-02-01 NOTE — Progress Notes (Addendum)
   Patient Name: [name] Date of Encounter: 02/01/2023 [facility] Cardiologist: Charlton Haws, MD   Interval Summary  .    Patient in no acute distress this morning, chronically ill appearing with frequent, wet sounding cough. Reports no acute dyspnea but does admit to feeling a little short of breath overnight.   Vital Signs .    Vitals:   01/31/23 1545 01/31/23 2353 02/01/23 0344 02/01/23 0725  BP: 108/73 112/68 119/76 123/75  Pulse: 89 85 93 93  Resp: 17 14 (!) 23 16  Temp: 97.7 F (36.5 C) 98.1 F (36.7 C) 97.6 F (36.4 C) 97.6 F (36.4 C)  TempSrc: Oral Oral Oral Oral  SpO2:  100% 100% 96%  Weight:   68 kg   Height:        Intake/Output Summary (Last 24 hours) at 02/01/2023 0952 Last data filed at 02/01/2023 0900 Gross per 24 hour  Intake 120 ml  Output 300 ml  Net -180 ml      02/01/2023    3:44 AM 01/31/2023    4:07 AM 01/29/2023    6:21 AM  Last 3 Weights  Weight (lbs) 149 lb 14.6 oz 151 lb 3.8 oz 153 lb 14.1 oz  Weight (kg) 68 kg 68.6 kg 69.8 kg      Telemetry/ECG    Rate controlled, persistent atrial fibrillation - Personally Reviewed  Physical Exam .   GEN: No acute distress. Frail and ill appearing with frequent cough Neck: No JVD Cardiac: irregularly irregular, no murmurs, rubs, or gallops.  Respiratory: diffuse rhonchi GI: Soft, nontender, non-distended  MS: pitting edema in bilateral LE to pre-tibial region.  Assessment & Plan .     Persistent atrial fibrillation  Patient with symptoms of orthopnea/worsening LE edema, somnolence was brought to the ED and found to be in afib with RVR.  She was found to have AF in April of 2021 and spontaneously converted to normal sinus rhythm. Long term anticoagulation was not initiated due to bleeding history.   Given esophageal varices/high bleeding risk, not a TEE/DCCV candidate. Also too high risk for OAC at this time.  Rates remain controlled on Metoprolol Tartrate 25mg  TID,  continue.   Acute HFrEF  TTE this admission with significantly reduced LVEF, down to 25-30%.   Possibly tachy-mediated cardiomyopathy. Given significant co-morbid conditions, unable to complete LHC Remains volume up on exam but still has hypotension and AKI precluding use of diuretics.  Continue metoprolol. Unable to add further GDMT seondary to AKI and hypotension  CAD s/p CABG 2017  As above, unable to repeat LHC to evaluate status of coronary anatomy in setting of acute HFrEF secondary to co-morbid conditions/AKI.   Per primary team:  Cirrhosis/NASH Acute cystitis Encephalopathy COVID-19  For questions or updates, please contact  [facility] Please consult [email or web address] for contact info under        Signed, Perlie Gold, PA-C   I have seen and examined the patient along with Perlie Gold, PA-C.  I have reviewed the chart, notes and new data.  I agree with PA/NP's note.   PLAN: AFib with adequate rate control. Unable to provide additional interventions for CHF at this time. Continue metoprolol and diuretics. Will follow from a distance. Plan repeat echo after at least a month of good rate control.  Thurmon Fair, MD, [facility] [phone] 02/01/2023, 11:33 AM

## 2023-02-02 DIAGNOSIS — G9341 Metabolic encephalopathy: Secondary | ICD-10-CM | POA: Diagnosis not present

## 2023-02-02 LAB — BLOOD GAS, ARTERIAL
Acid-Base Excess: 2.5 mmol/L — ABNORMAL HIGH (ref 0.0–2.0)
Bicarbonate: 26.3 mmol/L (ref 20.0–28.0)
Drawn by: 336832
O2 Saturation: 99.1 %
Patient temperature: 37
pCO2 arterial: 37 mmHg (ref 32–48)
pH, Arterial: 7.46 — ABNORMAL HIGH (ref 7.35–7.45)
pO2, Arterial: 89 mmHg (ref 83–108)

## 2023-02-02 LAB — CBC WITH DIFFERENTIAL/PLATELET
Abs Immature Granulocytes: 0.02 10*3/uL (ref 0.00–0.07)
Basophils Absolute: 0.1 10*3/uL (ref 0.0–0.1)
Basophils Relative: 1 %
Eosinophils Absolute: 0.1 10*3/uL (ref 0.0–0.5)
Eosinophils Relative: 3 %
HCT: 36.1 % (ref 36.0–46.0)
Hemoglobin: 11.4 g/dL — ABNORMAL LOW (ref 12.0–15.0)
Immature Granulocytes: 0 %
Lymphocytes Relative: 16 %
Lymphs Abs: 0.9 10*3/uL (ref 0.7–4.0)
MCH: 29.8 pg (ref 26.0–34.0)
MCHC: 31.6 g/dL (ref 30.0–36.0)
MCV: 94.3 fL (ref 80.0–100.0)
Monocytes Absolute: 1 10*3/uL (ref 0.1–1.0)
Monocytes Relative: 18 %
Neutro Abs: 3.3 10*3/uL (ref 1.7–7.7)
Neutrophils Relative %: 62 %
Platelets: 100 10*3/uL — ABNORMAL LOW (ref 150–400)
RBC: 3.83 MIL/uL — ABNORMAL LOW (ref 3.87–5.11)
RDW: 17.4 % — ABNORMAL HIGH (ref 11.5–15.5)
WBC: 5.4 10*3/uL (ref 4.0–10.5)
nRBC: 0.4 % — ABNORMAL HIGH (ref 0.0–0.2)

## 2023-02-02 LAB — BASIC METABOLIC PANEL
Anion gap: 7 (ref 5–15)
BUN: 26 mg/dL — ABNORMAL HIGH (ref 8–23)
CO2: 23 mmol/L (ref 22–32)
Calcium: 8.5 mg/dL — ABNORMAL LOW (ref 8.9–10.3)
Chloride: 107 mmol/L (ref 98–111)
Creatinine, Ser: 1.07 mg/dL — ABNORMAL HIGH (ref 0.44–1.00)
GFR, Estimated: 54 mL/min — ABNORMAL LOW (ref >60)
Glucose, Bld: 117 mg/dL — ABNORMAL HIGH (ref 70–99)
Potassium: 4.6 mmol/L (ref 3.5–5.1)
Sodium: 137 mmol/L (ref 135–145)

## 2023-02-02 LAB — GLUCOSE, CAPILLARY
Glucose-Capillary: 98 mg/dL (ref 70–99)
Glucose-Capillary: 100 mg/dL — ABNORMAL HIGH (ref 70–99)
Glucose-Capillary: 193 mg/dL — ABNORMAL HIGH (ref 70–99)
Glucose-Capillary: 255 mg/dL — ABNORMAL HIGH (ref 70–99)

## 2023-02-02 LAB — CULTURE, BLOOD (ROUTINE X 2)
Culture: NO GROWTH
Culture: NO GROWTH
Special Requests: ADEQUATE
Special Requests: ADEQUATE

## 2023-02-02 MED ORDER — LORAZEPAM 2 MG/ML IJ SOLN
0.5000 mg | Freq: Once | INTRAMUSCULAR | Status: AC | PRN
  Administered 2023-02-03: 0.5 mg via INTRAVENOUS
  Filled 2023-02-02: qty 1

## 2023-02-02 NOTE — Progress Notes (Signed)
PROGRESS NOTE    [name]  ZOX:[id number] DOB: 10/04/1945 DOA: 01/28/2023 PCP: Venita Sheffield, MD   Brief Narrative: [name] is a 77 y.o. female with a history of NASH cirrhosis, portal hypertensive gastropathy status post TIPS, thrombocytopenia, chronic respiratory failure, paroxysmal atrial fibrillation, diabetes mellitus type 2, diastolic heart failure, hypertension, CAD status post CABG, recurrent ESBL UTI.  Patient presented secondary to worsening confusion.  Patient found to be in atrial fibrillation with RVR in addition to having evidence of recurrent urinary tract infection.  Patient started on antibiotics for treatment and atrial fibrillation managed with metoprolol with control of high rates.  Hospitalization complicated by continued/recurrent altered mental status.   Assessment and Plan:  Acute metabolic encephalopathy Presumed multifactorial secondary to UTI and possibly COVID 19 infection.  Patient did have some improvement in mental status which seems to have worsened today per family. Possibly related to Seroquel. MRI obtained on 9/25 and was significant for evidence of possible PRES. -Check ABG -Neurology consult -Continue telesitter  Atrial fibrillation with RVR Not on anticoagulation per cardiology recommendations with history of variceal bleeding in addition to subdural and subarachnoid hemorrhage history. -Continue metoprolol  Acute combined systolic and diastolic heart failure Echocardiogram this admission significant for an LVEF of 25 to 30%.  Cardiology is consulted.  Secondary to comorbidities, recommendation for no left heart cath per cardiology.  Ability to diuresis complicated by hypotension and AKI. -Continue metoprolol  Delirium -Delirium precautions  Abnormal focus on MRI brain Single punctate focus incidentally on MRI brain located at the anterior left frontal lobe concerning for a subacute infarct. Neurology  consulted.  AKI Baseline creatinine of about 0.7-0.8. Creatinine of 1.28 on admission with peak of 1.76. Improved. Back down to 1.20.  UTI secondary to ESBL Klebsiella pneumoniae -Continue meropenem to complete a 5 day course  COVID-19 infection Patient was treated with Paxlovid and completed course. Last day of isolation is 9/27.  Leg swelling LE venous duplex negative for acute DVT.  Chronic respiratory failure with hypoxia -Continue supplemental oxygen  Diabetes mellitus, type 2 Uncontrolled with hyperglycemia from most recent hemoglobin A1C of 8.8% -Continue Semglee 10 units BID and SSI  CAD Stable.  Liver cirrhosis secondary to NASH Esophageal varices/portal hypertension s/p TIPS Spironolactone and furosemide held.  Hepatic encephalopathy -Continue lactulose and rifaximin  Primary hypertension -Continue metoprolol  Hyperlipidemia Zetia held  Pancytopenia Likely related to acute illness. -CBC with differential in AM  Goals of care Overall, prognosis seems poor.  Patient has underlying cognitive impairment and currently resides at long-term care.  Patient with new diagnosis of severe cardiomyopathy of unknown etiology at this time.  Patient with recurrent ESBL organism UTIs. Concern for patient's quality of life at this time. -Palliative care consult for goals of care  Pressure injury Mid sacrum.  Present on admission.   DVT prophylaxis: Lovenox Code Status:   Code Status: Limited: Do not attempt resuscitation (DNR) -DNR-LIMITED -Do Not Intubate/DNI  Family Communication: Daughter and granddaughter at bedside Disposition Plan: Discharge possibly back to long term care depending on goals of care discussions   Consultants:  Cardiology  Procedures:  9/22: Transthoracic Echocardiogram   Antimicrobials: Meropenem    Subjective: Patient without specific concerns. Patient with agitation overnight. Somewhat somnolent today.  Objective: BP 106/81 (BP  Location: Right Arm)   Pulse 98   Temp 98.6 F (37 C) (Oral)   Resp 16   Ht 5\' 2"  (1.575 m)   Wt 68 kg   LMP  (  LMP Unknown)   SpO2 100%   BMI 27.42 kg/m   Examination:  General exam: Appears calm and comfortable Respiratory system: Clear to auscultation. Respiratory effort normal. Cardiovascular system: S1 & S2 heard, RRR. No murmurs, rubs, gallops or clicks. Gastrointestinal system: Abdomen is nondistended, soft and nontender. No organomegaly or masses felt. Normal bowel sounds heard. Central nervous system: Lethargic but arouses slightly to persistent physical stimulus. Musculoskeletal: No edema. No calf tenderness   Data Reviewed: I have personally reviewed following labs and imaging studies  CBC Lab Results  Component Value Date   WBC 5.4 02/02/2023   RBC 3.83 (L) 02/02/2023   HGB 11.4 (L) 02/02/2023   HCT 36.1 02/02/2023   MCV 94.3 02/02/2023   MCH 29.8 02/02/2023   PLT 100 (L) 02/02/2023   MCHC 31.6 02/02/2023   RDW 17.4 (H) 02/02/2023   LYMPHSABS 0.9 02/02/2023   MONOABS 1.0 02/02/2023   EOSABS 0.1 02/02/2023   BASOSABS 0.1 02/02/2023     Last metabolic panel Lab Results  Component Value Date   NA 137 02/02/2023   K 4.6 02/02/2023   CL 107 02/02/2023   CO2 23 02/02/2023   BUN 26 (H) 02/02/2023   CREATININE 1.07 (H) 02/02/2023   GLUCOSE 117 (H) 02/02/2023   GFRNONAA 54 (L) 02/02/2023   GFRAA 86 10/13/2020   CALCIUM 8.5 (L) 02/02/2023   PHOS 2.7 09/09/2022   PROT 5.6 (L) 01/31/2023   ALBUMIN 2.2 (L) 01/31/2023   LABGLOB 3.5 07/24/2015   AGRATIO 1.1 (L) 07/24/2015   BILITOT 1.8 (H) 01/31/2023   ALKPHOS 119 01/31/2023   AST 63 (H) 01/31/2023   ALT 34 01/31/2023   ANIONGAP 7 02/02/2023    GFR: Estimated Creatinine Clearance: 40.5 mL/min (A) (by C-G formula based on SCr of 1.07 mg/dL (H)).  Recent Results (from the past 240 hour(s))  Blood culture (routine x 2)     Status: None (Preliminary result)   Collection Time: 01/28/23 11:01 AM    Specimen: BLOOD RIGHT ARM  Result Value Ref Range Status   Specimen Description BLOOD RIGHT ARM  Final   Special Requests   Final    BOTTLES DRAWN AEROBIC AND ANAEROBIC Blood Culture adequate volume   Culture   Final    NO GROWTH 4 DAYS Performed at [facility], [address] [place], [place] [address]    Report Status PENDING  Incomplete  Blood culture (routine x 2)     Status: None (Preliminary result)   Collection Time: 01/28/23 11:02 AM   Specimen: BLOOD LEFT ARM  Result Value Ref Range Status   Specimen Description BLOOD LEFT ARM  Final   Special Requests   Final    BOTTLES DRAWN AEROBIC AND ANAEROBIC Blood Culture adequate volume   Culture   Final    NO GROWTH 4 DAYS Performed at [facility], [address], [place], [place] [address]    Report Status PENDING  Incomplete  Urine Culture     Status: Abnormal   Collection Time: 01/28/23 11:12 AM   Specimen: Urine, Clean Catch  Result Value Ref Range Status   Specimen Description URINE, CLEAN CATCH  Final   Special Requests   Final    NONE Performed at [facility], [address] [place], [place] [address]    Culture (A)  Final    >=100,000 COLONIES/mL KLEBSIELLA PNEUMONIAE Confirmed Extended Spectrum Beta-Lactamase Producer (ESBL).  In bloodstream infections from ESBL organisms, carbapenems are preferred over  piperacillin/tazobactam. They are shown to have a lower risk of mortality. 30,000 COLONIES/mL KLEBSIELLA OXYTOCA    Report Status 01/31/2023 FINAL  Final   Organism ID, Bacteria KLEBSIELLA PNEUMONIAE (A)  Final   Organism ID, Bacteria KLEBSIELLA OXYTOCA (A)  Final      Susceptibility   Klebsiella oxytoca - MIC*    AMPICILLIN >=32 RESISTANT Resistant     CEFEPIME <=0.12 SENSITIVE Sensitive     CEFTRIAXONE <=0.25 SENSITIVE Sensitive     CIPROFLOXACIN <=0.25 SENSITIVE Sensitive     GENTAMICIN <=1 SENSITIVE Sensitive     IMIPENEM <=0.25 SENSITIVE Sensitive     NITROFURANTOIN 64 INTERMEDIATE  Intermediate     TRIMETH/SULFA <=20 SENSITIVE Sensitive     AMPICILLIN/SULBACTAM 8 SENSITIVE Sensitive     PIP/TAZO <=4 SENSITIVE Sensitive     * 30,000 COLONIES/mL KLEBSIELLA OXYTOCA   Klebsiella pneumoniae - MIC*    AMPICILLIN >=32 RESISTANT Resistant     CEFAZOLIN >=64 RESISTANT Resistant     CEFEPIME >=32 RESISTANT Resistant     CEFTRIAXONE >=64 RESISTANT Resistant     CIPROFLOXACIN 2 RESISTANT Resistant     GENTAMICIN >=16 RESISTANT Resistant     IMIPENEM <=0.25 SENSITIVE Sensitive     NITROFURANTOIN 64 INTERMEDIATE Intermediate     TRIMETH/SULFA >=320 RESISTANT Resistant     AMPICILLIN/SULBACTAM >=32 RESISTANT Resistant     PIP/TAZO 8 SENSITIVE Sensitive     * >=100,000 COLONIES/mL KLEBSIELLA PNEUMONIAE      Radiology Studies: MR BRAIN WO CONTRAST  Result Date: 02/02/2023 CLINICAL DATA:  Initial evaluation for mental status change, unknown cause. EXAM: MRI HEAD WITHOUT CONTRAST TECHNIQUE: Multiplanar, multiecho pulse sequences of the brain and surrounding structures were obtained without intravenous contrast. COMPARISON:  Prior CT from 01/28/2023 as well as previous brain MRI from 06/30/2022. FINDINGS: Brain: Generalized age-related cerebral atrophy. Patchy and confluent T2/FLAIR hyperintensity involving the periventricular deep white matter, consistent with chronic small vessel ischemic disease. Small chronic cortical to subcortical infarct present at the high left frontal lobe, new from prior. Single punctate focus of mild diffusion signal abnormality seen at the anterior left frontal lobe (series 2, image 43). While this finding could be artifactual, a possible small subacute ischemic infarct is difficult to exclude. This is not well seen on corresponding sequences. No other evidence for acute or subacute ischemia. No acute or chronic intracranial blood products. Patchy FLAIR signal abnormality seen within the cortical to subcortical aspects of the high posterior parietal lobes  bilaterally (series 6, image 33). While some signal changes were seen in this region on prior MRI, appearance is increased and more prominent, and raises the possibility for PRES. No significant mass effect. No mass lesion or midline shift. No hydrocephalus or extra-axial fluid collection. Pituitary gland and suprasellar region within normal limits. Vascular: Major intracranial vascular flow voids are maintained. Skull and upper cervical spine: Craniocervical junction within normal limits. Bone marrow signal intensity normal. No scalp soft tissue abnormality. Sinuses/Orbits: Globes and orbital soft tissues within normal limits. Mild scattered mucosal thickening present about the ethmoidal air cells. Paranasal sinuses are otherwise clear. Trace right mastoid effusion, of doubtful significance. Other: None. IMPRESSION: 1. Patchy FLAIR signal abnormality involving the cortical to subcortical aspects of the high posterior parietal lobes bilaterally. While some signal changes were seen in this region on prior MRI from 06/30/2022, appearance is increased and more prominent on today's exam, and raises the possibility for PRES. 2. Single punctate focus of mild diffusion signal abnormality at the anterior left frontal  lobe. While this finding could be artifactual, a possible small subacute ischemic infarct is difficult to exclude. 3. Underlying age-related cerebral atrophy with chronic small vessel ischemic disease. Small chronic cortical to subcortical infarct at the high left frontal lobe, new as compared to previous MRI. Electronically Signed   By: Rise Mu M.D.   On: 02/02/2023 05:31      LOS: 5 days    Jacquelin Hawking, MD [facility] 02/02/2023, 7:48 AM   If 7PM-7AM, please contact night-coverage [email or web address]

## 2023-02-02 NOTE — Consult Note (Signed)
Neurology Consultation  Reason for Consult: AMS/punctate infarct on MRI  Referring Physician: Dr. Caleb Popp   CC: AMS  History is obtained from:medical record  HPI: [name] is a 77 y.o. female past medical history of diastolic CHF, CKD, CAD s/p CABG, esophageal varices, hypertension, GERD, oxygen dependent, hyperlipidemia, liver cirrhosis due to nonalcoholic fatty liver disease, neuropathy, osteoarthritis, paroxysmal A-fib not on anticoagulation, diabetes, traumatic subdural hematoma who presented to the emergency room from SNF for altered mental status less responsive to staff.  She had recent COVID and wants on Paxlovid treatment. CT head revealed no acute process.  MRI brain revealed patchy FLAIR signal abnormality involving the cortical to subcortical aspects of high posterior parietal lobes bilaterally, punctate acute infarct in left frontal lobe Neurology consulted  LKW: at least a week ago IV thrombolysis given?: no, outside window  EVT:  No LVO Premorbid modified Rankin scale (mRS):  4-Needs assistance to walk and tend to bodily needs  ROS: Unable to obtain due to altered mental status.   Past Medical History:  Diagnosis Date   Chronic cystitis    Chronic diastolic CHF (congestive heart failure) (HCC)    followed by dr Eden Emms   Chronic kidney disease, stage I    Cirrhosis of liver without ascites (HCC) 03/2016   followed by dr Christella Hartigan (GI);   due to fatty liver;   post op cabg 09/ 2017  , post paracentesis for ascites   Coronary artery disease 01/2016   cardiologist--- dr Estrella Myrtle;   01-27-2016  NSTEMI  s/p cardiac cath;   s/p CABG 01/29/16: LIMA-LAD, SVG-RI, SVG-dRCA   DDD (degenerative disc disease), cervical    per pt w/ chronic neck pain   Dyspnea    occasional   Edema of right lower extremity    Esophageal varices (HCC)    followed by dr Christella Hartigan;   hx multiple banding of large varices   Essential hypertension    no meds   GERD (gastroesophageal reflux  disease)    Heart murmur     DX FOR YEARS ASYMPTOMATIC   Hepatic encephalopathy (HCC)    History of adenomatous polyp of colon    History of COVID-19 04/2020   per pt mild  symptoms that resolve   History of kidney stones    passed stones   History of non-ST elevation myocardial infarction (NSTEMI) 01/27/2016   Hyperlipidemia    diet controlled   NAFLD (nonalcoholic fatty liver disease)    Neuropathy, peripheral 04/01/2015   OA (osteoarthritis)    Paroxysmal A-fib (HCC)    08/19/19 afib with RVR, spontaneously converted to SR   Portal vein thrombosis    per dr Christella Hartigan  ,  chronic non-occluded   Thrombocytopenia (HCC)    followed by pcp  and hematology-- dr Mosetta Putt;   likely secondary to liver cirrhosis   Type 2 diabetes mellitus treated with insulin (HCC)    Type 2, followed by pcp    (07-21-2021  per pt checks blood sugar dialy in am fasting average 113--200s)   Urge incontinence of urine    Wears hearing aid in both ears      Family History  Problem Relation Age of Onset   Heart disease Mother    Breast cancer Mother    Lung cancer Father    Arthritis Sister    Arthritis Brother    Liver cancer Brother    Lymphoma Brother    Heart disease Maternal Grandmother    Heart disease Maternal Grandfather  Heart disease Paternal Grandmother    Heart disease Paternal Grandfather    Other Daughter        house fire   Breast cancer Maternal Aunt    Breast cancer Paternal Aunt    Colon cancer Neg Hx    Esophageal cancer Neg Hx    Rectal cancer Neg Hx    Stomach cancer Neg Hx      Social History:   reports that she has never smoked. She has never used smokeless tobacco. She reports that she does not drink alcohol and does not use drugs.  Medications  Current Facility-Administered Medications:    acetaminophen (TYLENOL) tablet 650 mg, 650 mg, Oral, Q6H PRN, 650 mg at 02/02/23 0513 **OR** acetaminophen (TYLENOL) suppository 650 mg, 650 mg, Rectal, Q6H PRN, Leeroy Bock, MD   albuterol (PROVENTIL) (2.5 MG/3ML) 0.083% nebulizer solution 2.5 mg, 2.5 mg, Nebulization, Q6H PRN, Leeroy Bock, MD   artificial tears (LACRILUBE) ophthalmic ointment 1 Application, 1 Application, Both Eyes, QHS, Leeroy Bock, MD, 1 Application at 02/01/23 2212   bisacodyl (DULCOLAX) EC tablet 5 mg, 5 mg, Oral, Daily PRN, Leeroy Bock, MD   Chlorhexidine Gluconate Cloth 2 % PADS 6 each, 6 each, Topical, Daily, Danford, Earl Lites, MD, 6 each at 02/01/23 0850   enoxaparin (LOVENOX) injection 30 mg, 30 mg, Subcutaneous, Q24H, Danford, Earl Lites, MD, 30 mg at 02/01/23 1324   insulin aspart (novoLOG) injection 0-9 Units, 0-9 Units, Subcutaneous, TID WC, Leeroy Bock, MD, 2 Units at 02/01/23 1712   insulin glargine-yfgn (SEMGLEE) injection 10 Units, 10 Units, Subcutaneous, BID, Danford, Earl Lites, MD, 10 Units at 02/01/23 2211   lactulose (CHRONULAC) 10 GM/15ML solution 20 g, 20 g, Oral, Daily, Jamelle Rushing L, MD, 20 g at 02/01/23 0850   lidocaine (LIDODERM) 5 % 1 patch, 1 patch, Transdermal, Daily, Leeroy Bock, MD, 1 patch at 02/01/23 0851   meropenem (MERREM) 1 g in sodium chloride 0.9 % 100 mL IVPB, 1 g, Intravenous, Q12H, Mosetta Anis, RPH, Last Rate: 200 mL/hr at 02/02/23 0514, 1 g at 02/02/23 0514   metoprolol tartrate (LOPRESSOR) tablet 25 mg, 25 mg, Oral, TID, Jonelle Sidle, MD, 25 mg at 02/01/23 2211   polyethylene glycol (MIRALAX / GLYCOLAX) packet 17 g, 17 g, Oral, Daily, Leeroy Bock, MD, 17 g at 01/31/23 0900   rifaximin (XIFAXAN) tablet 550 mg, 550 mg, Oral, BID, Leeroy Bock, MD, 550 mg at 02/01/23 2211   Exam: Current vital signs: BP 106/81 (BP Location: Right Arm)   Pulse 98   Temp 98.6 F (37 C) (Oral)   Resp 16   Ht 5\' 2"  (1.575 m)   Wt 68 kg   LMP  (LMP Unknown)   SpO2 100%   BMI 27.42 kg/m  Vital signs in last 24 hours: Temp:  [98.1 F (36.7 C)-98.7 F (37.1 C)] 98.6 F (37 C) (09/26  0500) Pulse Rate:  [75-98] 98 (09/26 0500) BP: (103-111)/(72-81) 106/81 (09/26 0500) SpO2:  [97 %-100 %] 100 % (09/26 0038)  GENERAL: Chronically ill frail elderly woman HEENT: - Normocephalic and atraumatic, dry mm LUNGS - Clear to auscultation bilaterally with no wheezes CV - S1S2 RRR, no m/r/g, equal pulses bilaterally. ABDOMEN - Soft, nontender, nondistended with normoactive BS Ext: warm, well perfused, intact peripheral pulses, no edema  NEURO:  Mental Status: She is lethargic needing constant stimulation to be engaged.  AA&O to self, age, year, and place.  Unsure of  situation Language: speech is hypophonic and clear.  Naming, repetition, fluency Cranial Nerves: PERRL  EOMI , visual fields full to threat bilaterally, facial asymmetry,  facial sensation intact, hearing intact, tongue/uvula/soft palate midline, normal  sternocleidomastoid and trapezius muscle strength. No evidence of tongue atrophy or fibrillations  Motor: 4/4 in all 4 extremities Tone: is normal and bulk is normal Sensation- Intact to light touch bilaterally Coordination: FTN intact bilaterally, Gait- deferred  NIHSS 1a Level of Conscious.: 1 1b LOC Questions: 0 1c LOC Commands: 0 2 Best Gaze: 0 3 Visual: 0 4 Facial Palsy: 0 5a Motor Arm - left: 0 5b Motor Arm - Right: 0 6a Motor Leg - Left: 0 6b Motor Leg - Right: 0 7 Limb Ataxia: 0 8 Sensory: 0 9 Best Language: 0 10 Dysarthria: 0 11 Extinct. and Inatten.: 0 TOTAL: 1   Labs I have reviewed labs in epic and the results pertinent to this consultation are:  CBC    Component Value Date/Time   WBC 5.4 02/02/2023 0313   RBC 3.83 (L) 02/02/2023 0313   HGB 11.4 (L) 02/02/2023 0313   HGB 14.5 11/08/2021 1005   HCT 36.1 02/02/2023 0313   HCT 31.0 (L) 05/07/2016 2018   PLT 100 (L) 02/02/2023 0313   PLT 96 (L) 11/08/2021 1005   MCV 94.3 02/02/2023 0313   MCH 29.8 02/02/2023 0313   MCHC 31.6 02/02/2023 0313   RDW 17.4 (H) 02/02/2023 0313   LYMPHSABS  0.9 02/02/2023 0313   MONOABS 1.0 02/02/2023 0313   EOSABS 0.1 02/02/2023 0313   BASOSABS 0.1 02/02/2023 0313    CMP     Component Value Date/Time   NA 137 02/02/2023 0313   NA 137 06/11/2019 0000   K 4.6 02/02/2023 0313   CL 107 02/02/2023 0313   CO2 23 02/02/2023 0313   GLUCOSE 117 (H) 02/02/2023 0313   BUN 26 (H) 02/02/2023 0313   BUN 24 (A) 06/11/2019 0000   CREATININE 1.07 (H) 02/02/2023 0313   CREATININE 0.76 09/14/2022 1023   CALCIUM 8.5 (L) 02/02/2023 0313   CALCIUM 13.4 (HH) 05/26/2019 1647   PROT 5.6 (L) 01/31/2023 0239   PROT 6.7 07/08/2016 1045   ALBUMIN 2.2 (L) 01/31/2023 0239   ALBUMIN 3.5 (L) 07/08/2016 1045   AST 63 (H) 01/31/2023 0239   AST 43 (H) 11/08/2021 1005   ALT 34 01/31/2023 0239   ALT 35 11/08/2021 1005   ALKPHOS 119 01/31/2023 0239   BILITOT 1.8 (H) 01/31/2023 0239   BILITOT 1.5 (H) 11/08/2021 1005   GFRNONAA 54 (L) 02/02/2023 0313   GFRNONAA >60 11/08/2021 1005   GFRNONAA 74 10/13/2020 0843   GFRAA 86 10/13/2020 0843    Lipid Panel     Component Value Date/Time   CHOL 174 01/04/2022 1434   CHOL 168 12/15/2020 0950   TRIG 91 01/04/2022 1434   HDL 89 01/04/2022 1434   HDL 85 12/15/2020 0950   CHOLHDL 2.0 01/04/2022 1434   VLDL 9 08/20/2019 0307   LDLCALC 68 01/04/2022 1434    Lab Results  Component Value Date   HGBA1C 8.8 (H) 08/03/2022      Imaging I have reviewed the images obtained:  CT-head no acute process  MRI examination of the brain-patchy FLAIR signal abnormality involving the cortical to subcortical aspects of high posterior parietal lobes bilaterally, punctate acute infarct in left frontal lobe  2D echo-EF 25 to 30%.  LV hypertrophy  Assessment:  77 y.o. female past medical history of diastolic  CHF, CKD, CAD s/p CABG, esophageal varices, hypertension, GERD, oxygen dependent, hyperlipidemia, liver cirrhosis due to nonalcoholic fatty liver disease, neuropathy, osteoarthritis, paroxysmal A-fib not on anticoagulation,  diabetes, traumatic subdural hematoma who presented to the emergency room from SNF for altered mental status less responsive to staff.  She had recent COVID and wants on Paxlovid treatment.  Recommendations: - HgbA1c, fasting lipid panel - Frequent neuro checks - CTA head and neck - Prophylactic therapy-Antiplatelet med: Aspirin - dose 81mg  and plavix 75mg  daily  - Risk factor modification - Telemetry monitoring - PT consult, OT consult, Speech consult - Stroke team to follow  Gevena Mart DNP, ACNPC-AG  [facility]  NEUROHOSPITALIST ADDENDUM Performed a face to face diagnostic evaluation.   I have reviewed the contents of history and physical exam as documented by PA/ARNP/Resident and agree with above documentation.  I have discussed and formulated the above plan as documented. Edits to the note have been made as needed.  Impression/Key exam findings/Plan: MRI changes most consistent with mild PRES. Etiology unclear, blood pressure seems well controlled and she is not on immunosuppression. Perhaps this is residual from prior hypertension. Also has a punctate stroke for which she will need full stroke workup.  I suspect that confusion is likely multifactorial from COVID, potential UTI.  In addition to stroke workup, will also check B12, folate, Thiamine, RPR and get a routine EEG.  Erick Blinks, MD [facility] [phone]   If 7pm to 7am, please call on call as listed on AMION.

## 2023-02-03 ENCOUNTER — Inpatient Hospital Stay (HOSPITAL_COMMUNITY): Payer: Medicare Other

## 2023-02-03 DIAGNOSIS — G9341 Metabolic encephalopathy: Secondary | ICD-10-CM | POA: Diagnosis not present

## 2023-02-03 DIAGNOSIS — R4182 Altered mental status, unspecified: Secondary | ICD-10-CM

## 2023-02-03 DIAGNOSIS — Z66 Do not resuscitate: Secondary | ICD-10-CM

## 2023-02-03 DIAGNOSIS — R569 Unspecified convulsions: Secondary | ICD-10-CM

## 2023-02-03 DIAGNOSIS — Z7189 Other specified counseling: Secondary | ICD-10-CM | POA: Diagnosis not present

## 2023-02-03 DIAGNOSIS — K7581 Nonalcoholic steatohepatitis (NASH): Secondary | ICD-10-CM | POA: Diagnosis not present

## 2023-02-03 DIAGNOSIS — U071 COVID-19: Secondary | ICD-10-CM | POA: Diagnosis not present

## 2023-02-03 LAB — LIPID PANEL
Cholesterol: 103 mg/dL (ref 0–200)
HDL: 26 mg/dL — ABNORMAL LOW (ref >40)
LDL Cholesterol: 68 mg/dL (ref 0–99)
Total CHOL/HDL Ratio: 4 {ratio}
Triglycerides: 45 mg/dL (ref <150)
VLDL: 9 mg/dL (ref 0–40)

## 2023-02-03 LAB — GLUCOSE, CAPILLARY
Glucose-Capillary: 169 mg/dL — ABNORMAL HIGH (ref 70–99)
Glucose-Capillary: 154 mg/dL — ABNORMAL HIGH (ref 70–99)
Glucose-Capillary: 157 mg/dL — ABNORMAL HIGH (ref 70–99)
Glucose-Capillary: 187 mg/dL — ABNORMAL HIGH (ref 70–99)
Glucose-Capillary: 176 mg/dL — ABNORMAL HIGH (ref 70–99)
Glucose-Capillary: 126 mg/dL — ABNORMAL HIGH (ref 70–99)

## 2023-02-03 LAB — RPR: RPR Ser Ql: NONREACTIVE

## 2023-02-03 LAB — HEMOGLOBIN A1C
Hgb A1c MFr Bld: 7 % — ABNORMAL HIGH (ref 4.8–5.6)
Mean Plasma Glucose: 154.2 mg/dL

## 2023-02-03 LAB — HIV ANTIBODY (ROUTINE TESTING W REFLEX): HIV Screen 4th Generation wRfx: NONREACTIVE

## 2023-02-03 LAB — FOLATE: Folate: 22.6 ng/mL (ref >5.9)

## 2023-02-03 LAB — VITAMIN B12: Vitamin B-12: 2939 pg/mL — ABNORMAL HIGH (ref 180–914)

## 2023-02-03 MED ORDER — LACTULOSE 10 GM/15ML PO SOLN
20.0000 g | Freq: Three times a day (TID) | ORAL | Status: DC
  Administered 2023-02-03 (×2): 20 g via ORAL
  Filled 2023-02-03 (×2): qty 30

## 2023-02-03 MED ORDER — ASPIRIN 81 MG PO TBEC
81.0000 mg | DELAYED_RELEASE_TABLET | Freq: Every day | ORAL | Status: DC
  Administered 2023-02-03 – 2023-02-07 (×5): 81 mg via ORAL
  Filled 2023-02-03 (×5): qty 1

## 2023-02-03 MED ORDER — ENOXAPARIN SODIUM 40 MG/0.4ML IJ SOSY
40.0000 mg | PREFILLED_SYRINGE | INTRAMUSCULAR | Status: DC
  Administered 2023-02-03 – 2023-02-07 (×5): 40 mg via SUBCUTANEOUS
  Filled 2023-02-03 (×5): qty 0.4

## 2023-02-03 NOTE — Consult Note (Signed)
Consultation Note Date: 02/03/2023   Patient Name: [name]  DOB: 20-Nov-1945  MRN: [id number]  Age / Sex: 77 y.o., female  PCP: Venita Sheffield, MD Referring Physician: Narda Bonds, MD  Reason for Consultation: Establishing goals of care  HPI/Patient Profile: 77 y.o. female  with past medical history of NASH cirrhosis, portal hypertensive gastropathy status post TIPS, thrombocytopenia, chronic respiratory failure, paroxysmal atrial fibrillation, diabetes mellitus type 2, diastolic heart failure, hypertension, CAD status post CABG, and recurrent ESBL UTI admitted on 01/28/2023 with AMS. Patient found to be in atrial fibrillation with RVR in addition to having evidence of recurrent urinary tract infection. Also concern for covid infection. Hospitalization complicated by ongoing AMS.   Clinical Assessment and Goals of Care: I have reviewed medical records including EPIC notes, labs and imaging, received report from RN, assessed the patient and then met with patient's son [name] at bedside  to discuss diagnosis prognosis, GOC, EOL wishes, disposition and options.  Patient briefly wakes up and states she is hungry but quickly falls back asleep and does not wake to voice or gentle touch. Ate well earlier per report.   I introduced Palliative Medicine as specialized medical care for people living with serious illness. It focuses on providing relief from the symptoms and stress of a serious illness. The goal is to improve quality of life for both the patient and the family.  [name] shares patient has been living at [facility] for several months - but was in and out of facilities before this. Chart review reveals 4 adm and 3 ED visits in last 6 months. Son reports patient was essentially wheelchair bound at [facility]. He reports good appetite. Tells me at [facility] she was  "clear headed".    We discussed patient's current illness and what  it means in the larger context of patient's on-going co-morbidities.  Natural disease trajectory and expectations at EOL were discussed. We reviewed her ongoing AMS and potential contributors including UTI, COVID, delirium, liver disease, medications, etc.   I attempted to elicit values and goals of care important to the patient.    Patient is established DNR/DNI with living will on file. HCPOA is named as spouse primary and son [name] secondary.   Son shares at this time he believes family's goals are to continue current care in hopes of improvement in mental status and return to [facility].   Discussed with son that per consult, palliative consult was requested by family. He tells me he is unaware of this - his sister [name] has been at the bedside the most so possibly her.   RN reports [name] had been at the bedside and was exhausted so had went home to rest. We discussed allowing her time to rest and attempt to speak with her tomorrow.  Discussed with son the importance of continued conversation with family and the medical providers regarding overall plan of care and treatment options, ensuring decisions are within the context of the patient's values and GOCs.    Primary Decision Maker HCPOA    SUMMARY OF RECOMMENDATIONS   - will request weekend follow up from PMT, unclear which family member requested palliative consult - daughter has been at bedside consistently but unfortunately had went to get some rest during my visit - established DNR/DNI - living will and HCPOA documents on file - son at bedside reports goals are to continue current measures with hopes of return to [facility]  Code Status/Advance Care Planning: DNR  Primary Diagnoses: Present on Admission:  Liver cirrhosis secondary to NASH [facility])  Essential hypertension  Hyperlipidemia  Thrombocytopenia (HCC)  Esophageal varices without bleeding (HCC)  Coronary artery disease of native artery of native heart with  stable angina pectoris (HCC)  COVID-19 virus infection  Pancytopenia (HCC)   I have reviewed the medical record, interviewed the patient and family, and examined the patient. The following aspects are pertinent.  Past Medical History:  Diagnosis Date   Chronic cystitis    Chronic diastolic CHF (congestive heart failure) (HCC)    followed by dr Eden Emms   Chronic kidney disease, stage I    Cirrhosis of liver without ascites (HCC) 03/2016   followed by dr Christella Hartigan (GI);   due to fatty liver;   post op cabg 09/ 2017  , post paracentesis for ascites   Coronary artery disease 01/2016   cardiologist--- dr Estrella Myrtle;   01-27-2016  NSTEMI  s/p cardiac cath;   s/p CABG 01/29/16: LIMA-LAD, SVG-RI, SVG-dRCA   DDD (degenerative disc disease), cervical    per pt w/ chronic neck pain   Dyspnea    occasional   Edema of right lower extremity    Esophageal varices (HCC)    followed by dr Christella Hartigan;   hx multiple banding of large varices   Essential hypertension    no meds   GERD (gastroesophageal reflux disease)    Heart murmur     DX FOR YEARS ASYMPTOMATIC   Hepatic encephalopathy (HCC)    History of adenomatous polyp of colon    History of COVID-19 04/2020   per pt mild  symptoms that resolve   History of kidney stones    passed stones   History of non-ST elevation myocardial infarction (NSTEMI) 01/27/2016   Hyperlipidemia    diet controlled   NAFLD (nonalcoholic fatty liver disease)    Neuropathy, peripheral 04/01/2015   OA (osteoarthritis)    Paroxysmal A-fib (HCC)    08/19/19 afib with RVR, spontaneously converted to SR   Portal vein thrombosis    per dr Christella Hartigan  ,  chronic non-occluded   Thrombocytopenia (HCC)    followed by pcp  and hematology-- dr Mosetta Putt;   likely secondary to liver cirrhosis   Type 2 diabetes mellitus treated with insulin (HCC)    Type 2, followed by pcp    (07-21-2021  per pt checks blood sugar dialy in am fasting average 113--200s)   Urge incontinence of urine    Wears  hearing aid in both ears    Social History   Socioeconomic History   Marital status: Married    Spouse name: Not on file   Number of children: 6   Years of education: Not on file   Highest education level: Not on file  Occupational History   Occupation: retired  Tobacco Use   Smoking status: Never   Smokeless tobacco: Never  Vaping Use   Vaping status: Never Used  Substance and Sexual Activity   Alcohol use: No   Drug use: Never   Sexual activity: Not Currently    Partners: Male    Birth control/protection: Surgical    Comment: Hysterectomy  Other Topics Concern   Not on file  Social History Narrative   She is retired she is married and lives with her husband   6 children   No alcohol or tobacco never smoker no drug use   Social Determinants of Health   Financial Resource Strain: Low Risk  (06/05/2022)   [facility]   Overall Financial Resource Strain (CARDIA)    Difficulty of Paying Living Expenses: Not hard at all  Food Insecurity: Patient Unable To Answer (01/28/2023)   Hunger Vital Sign    Worried About Running Out of Food in the Last Year: Patient unable to answer    Ran Out of Food in the Last Year: Patient unable to answer  Transportation Needs: Patient Unable To Answer (01/28/2023)   PRAPARE - Transportation    Lack of Transportation (Medical): Patient unable to answer    Lack of Transportation (Non-Medical): Patient unable to answer  Physical Activity: Inactive (05/29/2017)   Exercise Vital Sign    Days of Exercise per Week: 0 days    Minutes of Exercise per Session: 0 min  Stress: No Stress Concern Present (06/05/2022)   [facility], [facility]   [employer] [facility] Occupational [facility] - Occupational Stress Questionnaire    Feeling of Stress : Not at all  Social Connections: Unknown (06/02/2022)   Received from [employer], [facility]   Social Network    Social Network: Not on file   Family History   Problem Relation Age of Onset   Heart disease Mother    Breast cancer Mother    Lung cancer Father    Arthritis Sister    Arthritis Brother    Liver cancer Brother    Lymphoma Brother    Heart disease Maternal Grandmother    Heart disease Maternal Grandfather    Heart disease Paternal Grandmother    Heart disease Paternal Grandfather    Other Daughter        house fire   Breast cancer Maternal Aunt    Breast cancer Paternal Aunt    Colon cancer Neg Hx    Esophageal cancer Neg Hx    Rectal cancer Neg Hx    Stomach cancer Neg Hx    Scheduled Meds:  artificial tears  1 Application Both Eyes QHS   aspirin EC  81 mg Oral Daily   Chlorhexidine Gluconate Cloth  6 each Topical Daily   enoxaparin (LOVENOX) injection  40 mg Subcutaneous Q24H   insulin aspart  0-9 Units Subcutaneous TID WC   insulin glargine-yfgn  10 Units Subcutaneous BID   lactulose  20 g Oral TID   lidocaine  1 patch Transdermal Daily   metoprolol tartrate  25 mg Oral TID   polyethylene glycol  17 g Oral Daily   rifaximin  550 mg Oral BID   Continuous Infusions: PRN Meds:.acetaminophen **OR** acetaminophen, albuterol, bisacodyl Allergies  Allergen Reactions   Kiwi Extract Anaphylaxis   Gabapentin Nausea And Vomiting   Tdap [Tetanus-Diphth-Acell Pertussis] Swelling and Other (See Comments)    Swelling at injection site, gets very hot   Latex Itching, Dermatitis and Rash   Statins Other (See Comments)    RHABDOMYOLYSIS   Tramadol Nausea And Vomiting   Review of Systems  Unable to perform ROS: Mental status change    Physical Exam Constitutional:      General: She is not in acute distress.    Appearance: She is ill-appearing.     Comments: lethargic  Pulmonary:     Effort: Pulmonary effort is normal.  Skin:    General: Skin is warm and dry.     Coloration: Skin is jaundiced.     Vital Signs: BP 103/71 (BP Location: Left Arm)   Pulse 86   Temp (!) 97.5 F (36.4 C) (Oral)   Resp  16   Ht 5\' 2"   (1.575 m)   Wt 68 kg   LMP  (LMP Unknown)   SpO2 100%   BMI 27.42 kg/m  Pain Scale: PAINAD   Pain Score: Asleep   SpO2: SpO2: 100 % O2 Device:SpO2: 100 % O2 Flow Rate: .O2 Flow Rate (L/min): 3 L/min  IO: Intake/output summary: No intake or output data in the 24 hours ending 02/03/23 1716  LBM: Last BM Date : 02/02/23 Baseline Weight: Weight: 55.3 kg Most recent weight: Weight: 68 kg     Palliative Assessment/Data: PPS 40%     *Please note that this is a verbal dictation therefore any spelling or grammatical errors are due to the "Dragon Medical One" system interpretation.   Time Total: 45 minutes Time spent includes: Detailed review of medical records (labs, imaging, vital signs), medically appropriate exam, discussion with treatment team, counseling and educating patient, family and/or staff, documenting clinical information, medication management and coordination of care.    Gerlean Ren, DNP, AGNP-C Palliative Medicine Team [phone] Pager: [phone]

## 2023-02-03 NOTE — Progress Notes (Addendum)
STROKE TEAM PROGRESS NOTE   BRIEF HPI Ms. Frankee Gritz is a 77 y.o. female with history of hypertension, T2DM, newfound CHF (LVEF: 25 to 30%), cirrhosis, portal hypertensive gastropathy s/p TIPS procedure, thrombocytopenia, esophageal varices, recurrent UTI, CAD status post CABG presented 9/21 for altered mental status and worsening confusion.  Found in A-fib with RVR, UA positive for UTI.  Patient has had ongoing altered mental status throughout course of stay, likely secondary to acute metabolic encephalopathy and possible PRES syndrome.  Stroke was consulted for questionable left anterior frontal lobe small punctate focus.  INTERIM HISTORY/SUBJECTIVE On interview with family in room, patient is sitting up in bed, no acute distress but evidently confused and minimally participatory in interview.  Per daughter, in room, patient has had worsening altered mental status over the last 5 months.  She believes this is due to worsening vascular dementia in the setting of UTI.  Denies having seen as well as knowledge of any focal deficits either before or since presentation to [facility].  Patient neuro exam is nonfocal.   OBJECTIVE  CBC    Component Value Date/Time   WBC 5.4 02/02/2023 0313   RBC 3.83 (L) 02/02/2023 0313   HGB 11.4 (L) 02/02/2023 0313   HGB 14.5 11/08/2021 1005   HCT 36.1 02/02/2023 0313   HCT 31.0 (L) 05/07/2016 2018   PLT 100 (L) 02/02/2023 0313   PLT 96 (L) 11/08/2021 1005   MCV 94.3 02/02/2023 0313   MCH 29.8 02/02/2023 0313   MCHC 31.6 02/02/2023 0313   RDW 17.4 (H) 02/02/2023 0313   LYMPHSABS 0.9 02/02/2023 0313   MONOABS 1.0 02/02/2023 0313   EOSABS 0.1 02/02/2023 0313   BASOSABS 0.1 02/02/2023 0313    BMET    Component Value Date/Time   NA 137 02/02/2023 0313   NA 137 06/11/2019 0000   K 4.6 02/02/2023 0313   CL 107 02/02/2023 0313   CO2 23 02/02/2023 0313   GLUCOSE 117 (H) 02/02/2023 0313   BUN 26 (H) 02/02/2023 0313   BUN 24 (A) 06/11/2019 0000    CREATININE 1.07 (H) 02/02/2023 0313   CREATININE 0.76 09/14/2022 1023   CALCIUM 8.5 (L) 02/02/2023 0313   CALCIUM 13.4 (HH) 05/26/2019 1647   EGFR 82 09/14/2022 1023   GFRNONAA 54 (L) 02/02/2023 0313   GFRNONAA >60 11/08/2021 1005   GFRNONAA 74 10/13/2020 0843    IMAGING past 24 hours EEG adult  Result Date: 02/03/2023 Charlsie Quest, MD     02/03/2023  9:09 AM Patient Name: Meliya Mcconahy MRN: [id number] Epilepsy Attending: Charlsie Quest Referring Physician/Provider: Erick Blinks, MD Date: 02/03/2023 Duration: 23.15 mins Patient history: 77yo F with traumatic subdural hematoma who presented to the emergency room from SNF for altered mental status less responsive to staff. EEG to evaluate for seizure Level of alertness: Awake, asleep AEDs during EEG study: None Technical aspects: This EEG study was done with scalp electrodes positioned according to the 10-20 International system of electrode placement. Electrical activity was reviewed with band pass filter of 1-70Hz , sensitivity of 7 uV/mm, display speed of 70mm/sec with a 60Hz  notched filter applied as appropriate. EEG data were recorded continuously and digitally stored.  Video monitoring was available and reviewed as appropriate. Description: The posterior dominant rhythm consists of 7 Hz activity of moderate voltage (25-35 uV) seen predominantly in posterior head regions, symmetric and reactive to eye opening and eye closing. Sleep was characterized by vertex waves, sleep spindles (12 to 14 Hz),  maximal frontocentral region. EEG showed continuous generalized 3 to 6 Hz theta-delta slowing. Hyperventilation and photic stimulation were not performed.   ABNORMALITY - Continuous slow, generalized IMPRESSION: This study is suggestive of moderate diffuse encephalopathy. No seizures or epileptiform discharges were seen throughout the recording. Charlsie Quest    Vitals:   02/03/23 8657 02/03/23 0734 02/03/23 0954 02/03/23 1219  BP:  106/72 98/62 (!) 100/55 120/71  Pulse: 93 84  79  Resp: 15 16  19   Temp: 98.1 F (36.7 C) 98.2 F (36.8 C)  (!) 97.5 F (36.4 C)  TempSrc: Oral Oral  Oral  SpO2: 98% 98%  99%  Weight: 68 kg     Height:         PHYSICAL EXAM General:  Alert, well-nourished, well-developed patient in no acute distress Psych:  Mood and affect appropriate for situation CV: Regular rate and rhythm on monitor Respiratory:  Regular, unlabored respirations on room air GI: Abdomen soft and nontender   NEURO:  Mental Status: Alert and oriented to self Speech/Language: Speech is severely dysarthric, patient does not follow commands consistently  Cranial Nerves:  II: PERRL. Visual fields full.  III, IV, VI: EOMI. Eyelids elevate symmetrically.  V: Sensation is intact to light touch and symmetrical to face.  VII: Face is symmetrical resting and smiling VIII: hearing intact to voice. IX, X: Palate elevates symmetrically. Phonation is normal.  QI:ONGEXBMW shrug 5/5. XII: tongue is midline without fasciculations. Motor: 5/5 strength to all muscle groups tested.  Tone: is normal and bulk is normal Sensation- Intact to light touch bilaterally. Extinction absent to light touch to DSS.   Coordination: FTN intact bilaterally, Gait- deferred   ASSESSMENT/PLAN  Questionable very small single punctate focus of anterior left frontal lobe, likely artifact (not visible on coronal view)  Focal findings on neurological exam.  AMS present.  EEG negative.  CTA pending.  Per cards, cannot start oral anticoagulation in setting of very high bleeding risk.  Given her extensive, worsening medical comorbidities, we do not believe that this small anterior left frontal lobe punctate focus, read on radiological exam as a possible artifact, is likely significantly contributing to her current altered mental status. The benefit of further workup of this finding should be weighed against patient current goals of care.  Will add  aspirin 81 mg to her regimen, however, further anticoagulation puts patient at risk of severe complications in the setting of esophageal varices.  Stroke team is signing off at this time.  Code Stroke CT head: No acute abnormality.  Mild cerebral atrophy and chronic small vessel disease. CTA head & neck: Awaiting MRI: Single punctate focus and anterior left frontal lobe, possibly artifact.  Parietal lobe cortical and subcortical FLAIR signal abnormalities.  Concern for PRES. age-related cerebral atrophy with chronic small vessel disease. 2D Echo: LVEF: 25 to 30%.  Severe decreased left ventricular function.  Regional wall motion abnormalities.  Normal left atrium. LDL 68 HgbA1c 7.0 VTE prophylaxis -Lovenox Prescribed rifaximin (not taking at home), currently on rifaximin while inpatient Therapy recommendations: None Disposition: Possibly long-term care, depending on goals of care discussions  Atrial fibrillation with RVR Home Meds: rifaximin, not taking in setting of previous history of esophageal varices and thrombocytopenia. Metoprolol, continued in hospital. Continue telemetry monitoring  CHF, new  LVEF: 25 to 30% Severe decreased left ventricular function Continue metoprolol, per cards recs.  Hypertension Home meds: Spironolactone 50 mg Stable Blood Pressure Goal: BP less than 180/105   Hyperlipidemia Home meds: Ezetimibe  10 mg, fish oil,  LDL 68, goal < 70  Diabetes type II Controlled Home meds: Glargine 20 units daily/10 units nightly HgbA1c 7.0, goal < 7.0 CBGs SSI Recommend close follow-up with PCP for better DM control  Dysphagia Patient has post-stroke dysphagia, SLP consulted    Diet   Diet regular Room service appropriate? Yes; Fluid consistency: Thin   Advance diet as tolerated  Other Stroke Risk Factors Atrial fibrillation, not eligible for direct oral anticoagulation Acute combined systolic and diastolic heart failure Hypercoagulable state secondary to  COVID-19 infection T2DM History of coronary artery disease Hyperlipidemia   Other Active Problems See family medicine note for further details  Hospital day # 6  Luiz Iron, MD Psychiatry resident, PGY 1 I have personally obtained history,examined this patient, reviewed notes, independently viewed imaging studies, participated in medical decision making and plan of care.ROS completed by me personally and pertinent positives fully documented  I have made any additions or clarifications directly to the above note. Agree with note above.  Patient presented with multifactorial altered mental status likely due to encephalopathy without any focal neurological deficits.  MRI shows tiny punctate subcortical diffusion positive lesion seen only on axial DWI and not on corresponding coronal DWI or ADC map making it very questionable.  Patient has underlying vascular dementia as per family.  Recommend aspirin alone for stroke prevention and no further stroke workup.  Continue treatment of medical issues as per primary team.  Long discussion with the patient and daughter at the bedside and answered questions.  Stroke team will sign off.  Kindly call for questions.  Greater than 50% time during this 50-minute visit was spent on counseling and coordination of care about her clinical presentation and MRI findings and discussion with patient and daughter and answering questions.  Delia Heady, MD Medical Director [facility] Pager: [phone] 02/03/2023 4:11 PM  To contact Stroke Continuity provider, please refer to [email or web address]. After hours, contact General Neurology

## 2023-02-03 NOTE — Progress Notes (Addendum)
PROGRESS NOTE    [name]  ZOX:[id number] DOB: [birth year] DOA: 01/28/2023 PCP: Venita Sheffield, MD   Brief Narrative: [name] is a 77 y.o. female with a history of NASH cirrhosis, portal hypertensive gastropathy status post TIPS, thrombocytopenia, chronic respiratory failure, paroxysmal atrial fibrillation, diabetes mellitus type 2, diastolic heart failure, hypertension, CAD status post CABG, recurrent ESBL UTI.  Patient presented secondary to worsening confusion.  Patient found to be in atrial fibrillation with RVR in addition to having evidence of recurrent urinary tract infection.  Patient started on antibiotics for treatment and atrial fibrillation managed with metoprolol with control of high rates.  Hospitalization complicated by continued/recurrent altered mental status.   Assessment and Plan:  Acute metabolic encephalopathy Presumed multifactorial secondary to UTI and possibly COVID 19 infection.  Patient did have some improvement in mental status which seems to have worsened today per family. Possibly related to Seroquel. MRI obtained on 9/25 and was significant for evidence of possible PRES. ABG unremarkable for hypercapnia or acidemia. EEG without evidence of seizure activity. Normal vitamin B12 and folate. -Neurology recommendations: B1, RPR -Continue tele-sitter -Increase lactulose  Atrial fibrillation with RVR Not on anticoagulation per cardiology recommendations with history of variceal bleeding in addition to subdural and subarachnoid hemorrhage history. -Continue metoprolol  Acute combined systolic and diastolic heart failure Echocardiogram this admission significant for an LVEF of 25 to 30%.  Cardiology is consulted.  Secondary to comorbidities, recommendation for no left heart cath per cardiology.  Ability to diuresis complicated by hypotension and AKI. -Continue metoprolol  Delirium -Delirium precautions  Subacute infarct Single  punctate focus incidentally on MRI brain located at the anterior left frontal lobe concerning for a subacute infarct. Neurology consulted with recommendation for stroke workup. -Neurology recommendations: CTA head/neck, PT, OT, SLP, aspirin 81 mg and Plavix 75 mg daily  AKI Baseline creatinine of about 0.7-0.8. Creatinine of 1.28 on admission with peak of 1.76. Improved. Back down to 1.07.  UTI secondary to ESBL Klebsiella pneumoniae Patient completed a 5-day course of meropenem.  COVID-19 infection Patient was treated with Paxlovid and completed course. Last day of isolation is 9/27.  Leg swelling LE venous duplex negative for acute DVT.  Chronic respiratory failure with hypoxia -Continue supplemental oxygen  Diabetes mellitus, type 2 Controlled with hyperglycemia from most recent hemoglobin A1C of 8.8% -Continue Semglee 10 units BID and SSI  CAD Stable.  Liver cirrhosis secondary to NASH Esophageal varices/portal hypertension s/p TIPS Spironolactone and furosemide held.  Hepatic encephalopathy -Continue lactulose and rifaximin  Primary hypertension -Continue metoprolol  Hyperlipidemia Zetia held. LDL of 45.  Pancytopenia Likely related to acute illness. -CBC with differential in AM  Goals of care Overall, prognosis seems poor.  Patient has underlying cognitive impairment and currently resides at long-term care.  Patient with new diagnosis of severe cardiomyopathy of unknown etiology at this time.  Patient with recurrent ESBL organism UTIs. Concern for patient's quality of life at this time. -Palliative care consult for goals of care  Pressure injury Mid sacrum.  Present on admission.   DVT prophylaxis: Lovenox Code Status:   Code Status: Limited: Do not attempt resuscitation (DNR) -DNR-LIMITED -Do Not Intubate/DNI  Family Communication: Daughter and granddaughter at bedside Disposition Plan: Discharge possibly back to long term care depending on goals of care  discussions   Consultants:  Cardiology  Procedures:  9/22: Transthoracic Echocardiogram   Antimicrobials: Meropenem    Subjective: No specific issues. Somewhat somnolent but a bit better.  Objective: BP  120/71 (BP Location: Left Arm)   Pulse 79   Temp (!) 97.5 F (36.4 C) (Oral)   Resp 19   Ht 5\' 2"  (1.575 m)   Wt 68 kg   LMP  (LMP Unknown)   SpO2 99%   BMI 27.42 kg/m   Examination:  General exam: Appears calm and comfortable Respiratory system: Clear to auscultation. Respiratory effort normal. Cardiovascular system: S1 & S2 heard, RRR. Gastrointestinal system: Abdomen is nondistended, soft and nontender. Normal bowel sounds heard. Central nervous system: Alert and oriented to person and place.   Data Reviewed: I have personally reviewed following labs and imaging studies  CBC Lab Results  Component Value Date   WBC 5.4 02/02/2023   RBC 3.83 (L) 02/02/2023   HGB 11.4 (L) 02/02/2023   HCT 36.1 02/02/2023   MCV 94.3 02/02/2023   MCH 29.8 02/02/2023   PLT 100 (L) 02/02/2023   MCHC 31.6 02/02/2023   RDW 17.4 (H) 02/02/2023   LYMPHSABS 0.9 02/02/2023   MONOABS 1.0 02/02/2023   EOSABS 0.1 02/02/2023   BASOSABS 0.1 02/02/2023     Last metabolic panel Lab Results  Component Value Date   NA 137 02/02/2023   K 4.6 02/02/2023   CL 107 02/02/2023   CO2 23 02/02/2023   BUN 26 (H) 02/02/2023   CREATININE 1.07 (H) 02/02/2023   GLUCOSE 117 (H) 02/02/2023   GFRNONAA 54 (L) 02/02/2023   GFRAA 86 10/13/2020   CALCIUM 8.5 (L) 02/02/2023   PHOS 2.7 09/09/2022   PROT 5.6 (L) 01/31/2023   ALBUMIN 2.2 (L) 01/31/2023   LABGLOB 3.5 07/24/2015   AGRATIO 1.1 (L) 07/24/2015   BILITOT 1.8 (H) 01/31/2023   ALKPHOS 119 01/31/2023   AST 63 (H) 01/31/2023   ALT 34 01/31/2023   ANIONGAP 7 02/02/2023    GFR: Estimated Creatinine Clearance: 40.5 mL/min (A) (by C-G formula based on SCr of 1.07 mg/dL (H)).  Recent Results (from the past 240 hour(s))  Blood culture  (routine x 2)     Status: None   Collection Time: 01/28/23 11:01 AM   Specimen: BLOOD RIGHT ARM  Result Value Ref Range Status   Specimen Description BLOOD RIGHT ARM  Final   Special Requests   Final    BOTTLES DRAWN AEROBIC AND ANAEROBIC Blood Culture adequate volume   Culture   Final    NO GROWTH 5 DAYS Performed at [facility], [address], [place], [place] [address]    Report Status 02/02/2023 FINAL  Final  Blood culture (routine x 2)     Status: None   Collection Time: 01/28/23 11:02 AM   Specimen: BLOOD LEFT ARM  Result Value Ref Range Status   Specimen Description BLOOD LEFT ARM  Final   Special Requests   Final    BOTTLES DRAWN AEROBIC AND ANAEROBIC Blood Culture adequate volume   Culture   Final    NO GROWTH 5 DAYS Performed at [facility], [address], [place], [place] [address]    Report Status 02/02/2023 FINAL  Final  Urine Culture     Status: Abnormal   Collection Time: 01/28/23 11:12 AM   Specimen: Urine, Clean Catch  Result Value Ref Range Status   Specimen Description URINE, CLEAN CATCH  Final   Special Requests   Final    NONE Performed at [facility], [address], [place], [place] [address]    Culture (A)  Final    >=100,000 COLONIES/mL KLEBSIELLA  PNEUMONIAE Confirmed Extended Spectrum Beta-Lactamase Producer (ESBL).  In bloodstream infections from ESBL organisms, carbapenems are preferred over piperacillin/tazobactam. They are shown to have a lower risk of mortality. 30,000 COLONIES/mL KLEBSIELLA OXYTOCA    Report Status 01/31/2023 FINAL  Final   Organism ID, Bacteria KLEBSIELLA PNEUMONIAE (A)  Final   Organism ID, Bacteria KLEBSIELLA OXYTOCA (A)  Final      Susceptibility   Klebsiella oxytoca - MIC*    AMPICILLIN >=32 RESISTANT Resistant     CEFEPIME <=0.12 SENSITIVE Sensitive     CEFTRIAXONE <=0.25 SENSITIVE Sensitive     CIPROFLOXACIN <=0.25 SENSITIVE Sensitive     GENTAMICIN <=1 SENSITIVE Sensitive     IMIPENEM  <=0.25 SENSITIVE Sensitive     NITROFURANTOIN 64 INTERMEDIATE Intermediate     TRIMETH/SULFA <=20 SENSITIVE Sensitive     AMPICILLIN/SULBACTAM 8 SENSITIVE Sensitive     PIP/TAZO <=4 SENSITIVE Sensitive     * 30,000 COLONIES/mL KLEBSIELLA OXYTOCA   Klebsiella pneumoniae - MIC*    AMPICILLIN >=32 RESISTANT Resistant     CEFAZOLIN >=64 RESISTANT Resistant     CEFEPIME >=32 RESISTANT Resistant     CEFTRIAXONE >=64 RESISTANT Resistant     CIPROFLOXACIN 2 RESISTANT Resistant     GENTAMICIN >=16 RESISTANT Resistant     IMIPENEM <=0.25 SENSITIVE Sensitive     NITROFURANTOIN 64 INTERMEDIATE Intermediate     TRIMETH/SULFA >=320 RESISTANT Resistant     AMPICILLIN/SULBACTAM >=32 RESISTANT Resistant     PIP/TAZO 8 SENSITIVE Sensitive     * >=100,000 COLONIES/mL KLEBSIELLA PNEUMONIAE      Radiology Studies: EEG adult  Result Date: 02-06-2023 Charlsie Quest, MD     2023/02/06  9:09 AM Patient Name: [name] MRN: [id number] Epilepsy Attending: Charlsie Quest Referring Physician/Provider: Erick Blinks, MD Date: 02/06/2023 Duration: 23.15 mins Patient history: 77yo F with traumatic subdural hematoma who presented to the emergency room from SNF for altered mental status less responsive to staff. EEG to evaluate for seizure Level of alertness: Awake, asleep AEDs during EEG study: None Technical aspects: This EEG study was done with scalp electrodes positioned according to the 10-20 International system of electrode placement. Electrical activity was reviewed with band pass filter of 1-70Hz , sensitivity of 7 uV/mm, display speed of 52mm/sec with a 60Hz  notched filter applied as appropriate. EEG data were recorded continuously and digitally stored.  Video monitoring was available and reviewed as appropriate. Description: The posterior dominant rhythm consists of 7 Hz activity of moderate voltage (25-35 uV) seen predominantly in posterior head regions, symmetric and reactive to eye opening  and eye closing. Sleep was characterized by vertex waves, sleep spindles (12 to 14 Hz), maximal frontocentral region. EEG showed continuous generalized 3 to 6 Hz theta-delta slowing. Hyperventilation and photic stimulation were not performed.   ABNORMALITY - Continuous slow, generalized IMPRESSION: This study is suggestive of moderate diffuse encephalopathy. No seizures or epileptiform discharges were seen throughout the recording. Charlsie Quest   MR BRAIN WO CONTRAST  Result Date: 02/02/2023 CLINICAL DATA:  Initial evaluation for mental status change, unknown cause. EXAM: MRI HEAD WITHOUT CONTRAST TECHNIQUE: Multiplanar, multiecho pulse sequences of the brain and surrounding structures were obtained without intravenous contrast. COMPARISON:  Prior CT from 01/28/2023 as well as previous brain MRI from 06/30/2022. FINDINGS: Brain: Generalized age-related cerebral atrophy. Patchy and confluent T2/FLAIR hyperintensity involving the periventricular deep white matter, consistent with chronic small vessel ischemic disease. Small chronic cortical to subcortical infarct present at the high left frontal lobe, new  from prior. Single punctate focus of mild diffusion signal abnormality seen at the anterior left frontal lobe (series 2, image 43). While this finding could be artifactual, a possible small subacute ischemic infarct is difficult to exclude. This is not well seen on corresponding sequences. No other evidence for acute or subacute ischemia. No acute or chronic intracranial blood products. Patchy FLAIR signal abnormality seen within the cortical to subcortical aspects of the high posterior parietal lobes bilaterally (series 6, image 33). While some signal changes were seen in this region on prior MRI, appearance is increased and more prominent, and raises the possibility for PRES. No significant mass effect. No mass lesion or midline shift. No hydrocephalus or extra-axial fluid collection. Pituitary gland and  suprasellar region within normal limits. Vascular: Major intracranial vascular flow voids are maintained. Skull and upper cervical spine: Craniocervical junction within normal limits. Bone marrow signal intensity normal. No scalp soft tissue abnormality. Sinuses/Orbits: Globes and orbital soft tissues within normal limits. Mild scattered mucosal thickening present about the ethmoidal air cells. Paranasal sinuses are otherwise clear. Trace right mastoid effusion, of doubtful significance. Other: None. IMPRESSION: 1. Patchy FLAIR signal abnormality involving the cortical to subcortical aspects of the high posterior parietal lobes bilaterally. While some signal changes were seen in this region on prior MRI from 06/30/2022, appearance is increased and more prominent on today's exam, and raises the possibility for PRES. 2. Single punctate focus of mild diffusion signal abnormality at the anterior left frontal lobe. While this finding could be artifactual, a possible small subacute ischemic infarct is difficult to exclude. 3. Underlying age-related cerebral atrophy with chronic small vessel ischemic disease. Small chronic cortical to subcortical infarct at the high left frontal lobe, new as compared to previous MRI. Electronically Signed   By: Rise Mu M.D.   On: 02/02/2023 05:31      LOS: 6 days    Jacquelin Hawking, MD [facility] 02/03/2023, 12:34 PM   If 7PM-7AM, please contact night-coverage [email or web address]

## 2023-02-03 NOTE — Progress Notes (Signed)
EEG complete - results pending 

## 2023-02-03 NOTE — Procedures (Signed)
Patient Name: [name]  MRN: [id number]  Epilepsy Attending: Charlsie Quest  Referring Physician/Provider: Erick Blinks, MD  Date: 02/03/2023 Duration: 23.15 mins  Patient history: 77yo F with traumatic subdural hematoma who presented to the emergency room from SNF for altered mental status less responsive to staff. EEG to evaluate for seizure  Level of alertness: Awake, asleep  AEDs during EEG study: None  Technical aspects: This EEG study was done with scalp electrodes positioned according to the 10-20 International system of electrode placement. Electrical activity was reviewed with band pass filter of 1-70Hz , sensitivity of 7 uV/mm, display speed of 70mm/sec with a 60Hz  notched filter applied as appropriate. EEG data were recorded continuously and digitally stored.  Video monitoring was available and reviewed as appropriate.  Description: The posterior dominant rhythm consists of 7 Hz activity of moderate voltage (25-35 uV) seen predominantly in posterior head regions, symmetric and reactive to eye opening and eye closing. Sleep was characterized by vertex waves, sleep spindles (12 to 14 Hz), maximal frontocentral region. EEG showed continuous generalized 3 to 6 Hz theta-delta slowing. Hyperventilation and photic stimulation were not performed.     ABNORMALITY - Continuous slow, generalized  IMPRESSION: This study is suggestive of moderate diffuse encephalopathy. No seizures or epileptiform discharges were seen throughout the recording.  Lygia Olaes Annabelle Harman

## 2023-02-04 ENCOUNTER — Inpatient Hospital Stay (HOSPITAL_COMMUNITY): Payer: Medicare Other

## 2023-02-04 DIAGNOSIS — G9341 Metabolic encephalopathy: Secondary | ICD-10-CM | POA: Diagnosis not present

## 2023-02-04 DIAGNOSIS — Z7189 Other specified counseling: Secondary | ICD-10-CM | POA: Diagnosis not present

## 2023-02-04 DIAGNOSIS — Z515 Encounter for palliative care: Secondary | ICD-10-CM | POA: Diagnosis not present

## 2023-02-04 LAB — COMPREHENSIVE METABOLIC PANEL
ALT: 31 U/L (ref 0–44)
AST: 62 U/L — ABNORMAL HIGH (ref 15–41)
Albumin: 2.4 g/dL — ABNORMAL LOW (ref 3.5–5.0)
Alkaline Phosphatase: 146 U/L — ABNORMAL HIGH (ref 38–126)
Anion gap: 8 (ref 5–15)
BUN: 19 mg/dL (ref 8–23)
CO2: 23 mmol/L (ref 22–32)
Calcium: 8.8 mg/dL — ABNORMAL LOW (ref 8.9–10.3)
Chloride: 106 mmol/L (ref 98–111)
Creatinine, Ser: 0.86 mg/dL (ref 0.44–1.00)
GFR, Estimated: >60 mL/min (ref >60)
Glucose, Bld: 132 mg/dL — ABNORMAL HIGH (ref 70–99)
Potassium: 5.2 mmol/L — ABNORMAL HIGH (ref 3.5–5.1)
Sodium: 137 mmol/L (ref 135–145)
Total Bilirubin: 3.1 mg/dL — ABNORMAL HIGH (ref 0.3–1.2)
Total Protein: 6.2 g/dL — ABNORMAL LOW (ref 6.5–8.1)

## 2023-02-04 LAB — GLUCOSE, CAPILLARY
Glucose-Capillary: 101 mg/dL — ABNORMAL HIGH (ref 70–99)
Glucose-Capillary: 202 mg/dL — ABNORMAL HIGH (ref 70–99)
Glucose-Capillary: 278 mg/dL — ABNORMAL HIGH (ref 70–99)
Glucose-Capillary: 204 mg/dL — ABNORMAL HIGH (ref 70–99)

## 2023-02-04 LAB — POTASSIUM: Potassium: 4.7 mmol/L (ref 3.5–5.1)

## 2023-02-04 LAB — AMMONIA: Ammonia: 100 umol/L — ABNORMAL HIGH (ref 9–35)

## 2023-02-04 MED ORDER — IOHEXOL 350 MG/ML SOLN
75.0000 mL | Freq: Once | INTRAVENOUS | Status: AC | PRN
  Administered 2023-02-04: 75 mL via INTRAVENOUS

## 2023-02-04 MED ORDER — LACTULOSE 10 GM/15ML PO SOLN
20.0000 g | Freq: Two times a day (BID) | ORAL | Status: DC
  Administered 2023-02-04 – 2023-02-06 (×5): 20 g via ORAL
  Filled 2023-02-04 (×6): qty 30

## 2023-02-04 NOTE — Progress Notes (Signed)
Daily Progress Note   Patient Name: [name]       Date: 02/04/2023 DOB: 03-21-1946  Age: 77 y.o. MRN#: [id number] Attending Physician: Kayla Bonds, MD Primary Care Physician: Kayla Sheffield, MD Admit Date: 01/28/2023  Reason for Consultation/Follow-up: Establishing goals of care  Length of Stay: 7  Current Medications: Scheduled Meds:   artificial tears  1 Application Both Eyes QHS   aspirin EC  81 mg Oral Daily   Chlorhexidine Gluconate Cloth  6 each Topical Daily   enoxaparin (LOVENOX) injection  40 mg Subcutaneous Q24H   insulin aspart  0-9 Units Subcutaneous TID WC   insulin glargine-yfgn  10 Units Subcutaneous BID   lactulose  20 g Oral BID   lidocaine  1 patch Transdermal Daily   metoprolol tartrate  25 mg Oral TID   polyethylene glycol  17 g Oral Daily   rifaximin  550 mg Oral BID    Continuous Infusions:   PRN Meds: acetaminophen **OR** acetaminophen, albuterol, bisacodyl  Physical Exam Vitals reviewed.  Constitutional:      Appearance: She is ill-appearing.     Interventions: Nasal cannula in place.  Neurological:     Mental Status: She is disoriented.  Psychiatric:        Behavior: Behavior is agitated.             Vital Signs: BP 124/63 (BP Location: Left Arm)   Pulse 81   Temp (!) 97.4 F (36.3 C) (Oral)   Resp 19   Ht 5\' 2"  (1.575 m)   Wt 67.5 kg   LMP  (LMP Unknown)   SpO2 94%   BMI 27.22 kg/m  SpO2: SpO2: 94 % O2 Device: O2 Device: Room Air O2 Flow Rate: O2 Flow Rate (L/min): 3 L/min        Patient Active Problem List   Diagnosis Date Noted   Leg swelling 01/30/2023   Chronic respiratory failure with hypoxia (HCC) 01/29/2023   Acute cystitis 01/29/2023   Acute on chronic HFrEF (heart failure with reduced  ejection fraction) (HCC) 01/28/2023   Atrial fibrillation with RVR (HCC) 01/28/2023   Leukopenia 11/27/2022   UTI (urinary tract infection) 10/13/2022   Acute metabolic encephalopathy 09/03/2022   Elevated troponin 09/03/2022   Hypocalcemia 09/03/2022   Pressure injury of skin 09/03/2022  Traumatic subdural hematoma (HCC) 08/21/2022   Prolonged QT interval 08/21/2022   Subarachnoid hemorrhage (HCC) 08/21/2022   Lumbar transverse process fracture (HCC) 08/21/2022   Fall at home 08/21/2022   Wound of left leg 08/21/2022   Pancytopenia (HCC) 08/21/2022   Mucocele of appendix 08/21/2022   Weight loss 08/21/2022   Vertigo 07/01/2022   Chronic diastolic CHF (congestive heart failure) (HCC) 07/01/2022   GAD (generalized anxiety disorder) 07/01/2022   Bilateral lower extremity edema 07/01/2022   Tenderness of left calf 07/01/2022   Left hip pain 07/01/2022   Hypoalbuminemia 07/01/2022   S/P lumbar fusion 02/25/2022   Pyohydronephrosis 05/29/2021   URI (upper respiratory infection) 05/12/2021   Lymphopenia 11/05/2020   CHF (congestive heart failure) (HCC)    Coronary artery disease    Type II diabetes mellitus (HCC)    COVID-19 virus infection 04/2020   A-fib (HCC) 08/19/2019   S/P TIPS (transjugular intrahepatic portosystemic shunt)    Hypercalcemia 05/26/2019   AKI (acute kidney injury) (HCC)    Hyponatremia    Acute lower UTI 05/16/2019   Acute delirium    General weakness 05/15/2019   Encephalopathy, hepatic (HCC) 05/15/2019   Volume depletion 05/15/2019   Bleeding esophageal varices (HCC)    UGI bleed    Infarction of spleen 04/06/2019   Odynophagia 04/01/2019   Dysphagia    Hepatoma (HCC) 01/31/2019   Thrombocytopenia (HCC) 09/25/2017   Left forearm pain 05/29/2017   Portal vein thrombosis 04/22/2017   Skin cancer of nose 08/23/2016   History of colonic polyps    Benign neoplasm of rectum    Esophageal varices without bleeding (HCC)    Portal hypertensive  gastropathy (HCC)    Leg cramps 05/18/2016   Routine lab draw 05/13/2016   Iron deficiency anemia 05/09/2016   Statin-induced rhabdomyolysis 05/07/2016   Liver cirrhosis secondary to NASH (HCC) 04/05/2016   PVC's (premature ventricular contractions) 04/05/2016   Hypokalemia 02/16/2016   Coronary artery disease of native artery of native heart with stable angina pectoris (HCC) 02/16/2016   S/P CABG x 3 02/02/2016   History of non-ST elevation myocardial infarction (NSTEMI) 01/27/2016   Trochanteric bursitis of left hip 12/15/2015   Type 2 diabetes mellitus without complication (HCC) 12/15/2015   S/P lumbar spinal fusion 06/10/2015   Paresthesia 04/01/2015   Osteoporosis 03/31/2015   Hearing loss 06/25/2014   Insomnia 06/25/2014   Hair thinning 06/25/2014   Muscle spasm 10/08/2013   Biceps tendon rupture, proximal 11/24/2012   Hyperlipidemia    GERD (gastroesophageal reflux disease)    Essential hypertension     Palliative Care Assessment & Plan   Patient Profile: 77 y.o. female  with past medical history of NASH cirrhosis, portal hypertensive gastropathy status post TIPS, thrombocytopenia, chronic respiratory failure, paroxysmal atrial fibrillation, diabetes mellitus type 2, diastolic heart failure, hypertension, CAD status post CABG, and recurrent ESBL UTI admitted on 01/28/2023 with AMS. Patient found to be in atrial fibrillation with RVR in addition to having evidence of recurrent urinary tract infection. Also concern for covid infection. Hospitalization complicated by ongoing AMS.   Assessment: Patient was sitting in bed surrounded by her family. Her husband, all five children, granddaughter and some in-laws were at bedside. The patient appears agitated attempting to get to edge of bed/ out of bed.   Family has a good understanding of the patient's chronic and acute illnesses. I reviewed my understanding, including limitations of ongoing interventions and high risk for further  decline despite treatment efforts. We discussed  the patient's frequent hospitalizations for exacerbations of chronic conditions and acute infections (UTIs). We also discussed her frequent altered mental status. The family reports she has had a steady decline over the last year or so. They also report that she has remained fairly stable while at [facility]. Her husband is also at [employer]. We discussed the natural disease trajectories of several of her chronic conditions.  I reviewed what this would look like and emphasized the importance of considering her quality of life, overall suffering, what would be acceptable to her in the future.  The family agrees that she would not want to be in her condition-- restrained, confused, and hospitalized. We discussed potential disposition options including continued treatment at rehab with palliative follow up and hospice if they decide to transition to a comfort path. Educated on the hospice philosophy and discussed what hospice might look like for this patient. Answered family questions. They are concerned about a few of her medications and if they are covered with hospice. I will reach out to hospice liaison. We also want to ensure that if shew were to go to LTC with hospice that it would be covered. They have applied for Medicaid in July of this year but have not heard back.  We agree that I will find the answers to their questions about medication and LTC facility with hospice coverage. They will continue to discuss goals of care but are leaning towards hospice. I will follow up with son Kane tomorrow. We can meet again as a family if they wish.  Gave family Hard Choices booklet and encouraged them to call with questions or concerns.  Called Hospice liaison to verify medication availability.  Recommendations/Plan: DNR Limited scope Continued conversations re: goals of care SW re: LTC and hospice and Medicaid status Continued PMT support   Code Status:     Code Status Orders  (From admission, onward)           Start     Ordered   01/28/23 1620  Do not attempt resuscitation (DNR)- Limited -Do Not Intubate (DNI)  (Code Status)  Continuous       Question Answer Comment  If pulseless and not breathing No CPR or chest compressions.   In Pre-Arrest Conditions (Patient Is Breathing and Has A Pulse) Do not intubate. Provide all appropriate non-invasive medical interventions. Avoid ICU transfer unless indicated or required.   Consent: Discussion documented in EHR or advanced directives reviewed      01/28/23 1620           Extensive chart review has been completed prior to seeing the patient and meeting with her family including labs, vital signs, imagine, progress/consult notes, orders, medications, and available advance directive documents.  Care plan was discussed with Dr. Caleb Popp  Time spent: 90 minutes  Thank you for allowing the Palliative Medicine Team to assist in the care of this patient.   Sherryll Burger, NP  Please contact Palliative Medicine Team phone at [phone] for questions and concerns.

## 2023-02-04 NOTE — Plan of Care (Signed)
Pt remains weak and not adhering to safety precautions. Pt constantly redirected to stay in bed. Pt has telesitter at bedside.

## 2023-02-04 NOTE — Evaluation (Signed)
Occupational Therapy Evaluation Patient Details Name: [name] MRN: [id number] DOB: [birth year] Today's Date: 02/04/2023   History of Present Illness 77 yo female [facility] from [facility] with SOB, recent covid and PNA diagnosis. Workup for AME, HF, afib, UTI. Additionally, MRI brain shows questionable L anterior frontal lobe small punctate infarct, neurology stating probably due to artifact. PMH includes CHF, DM2, GERD, HTN, CAD s/p CABG x 3, statin induced rhabdomyolysis, chronic O2 use, IDA, traumatic subdural hematoma, lumbar fracture, liver cirrhosis secondary to NASH, pancytopenia, sacral pressure ulcer, HLD, hearing loss, osteoporosis, A-fib, esophageal varices, s/p TIPS   Clinical Impression   Pt is a long-term care resident of [facility] where she receives assistance for ADLs and IADLs and performs functional mobility with a RW. Pt now presents with decreased activity tolerance, B UE generalized weakness, decreased B fine motor coordination, decreased balance during functional tasks, and decreased safety and independence with functional tasks. Pt currently demonstrates ability to complete UB ADLs with  Set up to Mod assist, LB ADLs with Mod to Total assist +2, and functional transfers/mobility with a RW with Min to Mod assist +2. Pt requires cues for safety, sequencing, and initiation during functional tasks. Pt will benefit from acute skilled OT services to address deficits outlined below, decrease caregiver burden, and increase safety and independence with functional tasks. Post acute discharge, pt will benefit from returning to [facility] with continued skilled OT services at the discretion of the facility.       If plan is discharge home, recommend the following: Two people to help with walking and/or transfers;Two people to help with bathing/dressing/bathroom;Assistance with cooking/housework;Direct supervision/assist for medications management;Direct supervision/assist for  financial management;Assist for transportation;Help with stairs or ramp for entrance;Supervision due to cognitive status (Set up for self feeding)    Functional Status Assessment  Patient has had a recent decline in their functional status and demonstrates the ability to make significant improvements in function in a reasonable and predictable amount of time.  Equipment Recommendations  Other (comment) (defer to next level of care)    Recommendations for Other Services       Precautions / Restrictions Precautions Precautions: Fall Restrictions Weight Bearing Restrictions: No      Mobility Bed Mobility Overal bed mobility: Needs Assistance Bed Mobility: Supine to Sit, Sit to Supine     Supine to sit: Min assist, +2 for safety/equipment, HOB elevated Sit to supine: Mod assist, +2 for physical assistance, HOB elevated   General bed mobility comments: min-mod +2 for trunk and LE management, boost up in bed once returned to supine.    Transfers Overall transfer level: Needs assistance Equipment used: Rolling walker (2 wheels) Transfers: Sit to/from Stand, Bed to chair/wheelchair/BSC Sit to Stand: Mod assist, +2 physical assistance     Step pivot transfers: +2 physical assistance, Min assist     General transfer comment: mod +2 for power up, rise, and steadying with rise. stand x2, step pivot to/from chair in room with steadying and RW navigation assist.      Balance Overall balance assessment: Needs assistance, History of Falls Sitting-balance support: No upper extremity supported, Feet supported Sitting balance-Leahy Scale: Fair     Standing balance support: Bilateral upper extremity supported, During functional activity Standing balance-Leahy Scale: Poor Standing balance comment: posterior bias                           ADL either performed or assessed with  clinical judgement   ADL Overall ADL's : Needs assistance/impaired Eating/Feeding: Set  up;Sitting   Grooming: Supervision/safety;Cueing for sequencing;Sitting (cues for initiation)   Upper Body Bathing: Moderate assistance;Cueing for sequencing;Sitting (cues for initiation)   Lower Body Bathing: Maximal assistance;Cueing for safety;Cueing for sequencing;Sit to/from stand;+2 for safety/equipment;+2 for physical assistance (cues for initiation)   Upper Body Dressing : Minimal assistance;Sitting (cues for initiation)   Lower Body Dressing: Moderate assistance;Sit to/from stand;Cueing for sequencing;Cueing for safety;+2 for safety/equipment;+2 for physical assistance (cues for initiation)   Toilet Transfer: Minimal assistance;Moderate assistance;+2 for physical assistance;BSC/3in1;Rolling walker (2 wheels);Cueing for safety;Cueing for sequencing (cues for initiation; Mod assist +2 to power up and Min assist +2 once in standing)   Toileting- Clothing Manipulation and Hygiene: Total assistance;Sit to/from stand;+2 for safety/equipment;+2 for physical assistance       Functional mobility during ADLs: Minimal assistance;Moderate assistance;+2 for physical assistance;Rolling walker (2 wheels);Cueing for safety;Cueing for sequencing General ADL Comments: Pt presenting with decreased activity tolerance during functional tasks and fatiguing quickly.     Vision Patient Visual Report: No change from baseline       Perception         Praxis         Pertinent Vitals/Pain Pain Assessment Pain Assessment: Faces Faces Pain Scale: Hurts little more Pain Location: buttocks Pain Descriptors / Indicators: Grimacing, Guarding Pain Intervention(s): Limited activity within patient's tolerance, Monitored during session, Repositioned     Extremity/Trunk Assessment Upper Extremity Assessment Upper Extremity Assessment: Right hand dominant;Generalized weakness;RUE deficits/detail;LUE deficits/detail RUE Coordination: decreased fine motor LUE Coordination: decreased fine motor   Lower  Extremity Assessment Lower Extremity Assessment: Defer to PT evaluation   Cervical / Trunk Assessment Cervical / Trunk Assessment: Kyphotic   Communication Communication Communication: Hearing impairment   Cognition Arousal: Alert Behavior During Therapy: Flat affect Overall Cognitive Status: History of cognitive impairments - at baseline Area of Impairment: Attention, Memory, Following commands, Safety/judgement, Problem solving                   Current Attention Level: Focused Memory: Decreased short-term memory, Decreased recall of precautions Following Commands: Follows one step commands with increased time Safety/Judgement: Decreased awareness of safety   Problem Solving: Slow processing, Decreased initiation, Difficulty sequencing, Requires verbal cues, Requires tactile cues General Comments: Hx of dementia. No family/caregiver present to confirm pt's baseline cognitive level.     General Comments  Skin breakdown noted on sacrum, pericare assist needed as pt soiled in stool. Pt on RA upon arrival to room, SpO2 85-95% on RA with DOE 2/4 after return to supine, applied 1LO2 (which is pt's baseline) and pt satting 90-95% upon PT exit and RN notified    Exercises     Shoulder Instructions      Home Living Family/patient expects to be discharged to:: Skilled nursing facility (Long term care)                                 Additional Comments: [facility] term care at [facility]      Prior Functioning/Environment Prior Level of Function : Patient poor historian/Family not available             Mobility Comments: Per pt and chart review, pt mobilizes with RW ADLs Comments: Pt patient and chart review, pt receives assistance for all ADLs. Uncertain or level of assist required.        OT Problem List: Decreased strength;Decreased  activity tolerance;Impaired balance (sitting and/or standing);Decreased coordination;Decreased knowledge of use of DME or AE       OT Treatment/Interventions: Self-care/ADL training;Therapeutic exercise;DME and/or AE instruction;Therapeutic activities;Patient/family education;Balance training    OT Goals(Current goals can be found in the care plan section) Acute Rehab OT Goals Patient Stated Goal: Pt unable to state OT Goal Formulation: Patient unable to participate in goal setting Time For Goal Achievement: 02/18/23 Potential to Achieve Goals: Fair ADL Goals Pt Will Perform Upper Body Bathing: with contact guard assist;sitting (with cues as needed) Pt Will Perform Lower Body Bathing: with min assist;sitting/lateral leans;sit to/from stand (with cues as needed) Pt Will Perform Upper Body Dressing: sitting;with supervision (with cues as needed) Pt Will Perform Lower Body Dressing: with contact guard assist;sitting/lateral leans;sit to/from stand (with cues as needed) Pt Will Transfer to Toilet: with contact guard assist;ambulating;regular height toilet;grab bars (with cues as needed; with least restrictive AD) Pt Will Perform Toileting - Clothing Manipulation and hygiene: with min assist;sitting/lateral leans;sit to/from stand  OT Frequency: Min 1X/week    Co-evaluation PT/OT/SLP Co-Evaluation/Treatment: Yes Reason for Co-Treatment: For patient/therapist safety;To address functional/ADL transfers;Necessary to address cognition/behavior during functional activity   OT goals addressed during session: ADL's and self-care      [facility] OT "6 Clicks" Daily Activity     Outcome Measure Help from another person eating meals?: A Little Help from another person taking care of personal grooming?: A Little Help from another person toileting, which includes using toliet, bedpan, or urinal?: A Lot Help from another person bathing (including washing, rinsing, drying)?: A Lot Help from another person to put on and taking off regular upper body clothing?: A Little Help from another person to put on and taking off regular lower  body clothing?: A Little 6 Click Score: 16   End of Session Equipment Utilized During Treatment: Gait belt;Rolling walker (2 wheels);Oxygen Nurse Communication: Mobility status;Other (comment) (skin breakdown at sacral area)  Activity Tolerance: Patient tolerated treatment well;Patient limited by fatigue Patient left: in bed;with call bell/phone within reach;with bed alarm set;with restraints reapplied;with nursing/sitter in room  OT Visit Diagnosis: Unsteadiness on feet (R26.81);Other abnormalities of gait and mobility (R26.89);Muscle weakness (generalized) (M62.81);Ataxia, unspecified (R27.0);Other (comment) (Decreased activity tolerance)                Time: 7829-5621 OT Time Calculation (min): 32 min Charges:  OT General Charges $OT Visit: 1 Visit OT Evaluation $OT Eval Low Complexity: 1 Low  [address] M., OTR/L, MA Acute Rehab [phone]   Lendon Colonel 02/04/2023, 1:59 PM

## 2023-02-04 NOTE — Progress Notes (Signed)
PROGRESS NOTE    [name]  DGL:[id number] DOB: 03/22/1946 DOA: 01/28/2023 PCP: Venita Sheffield, MD   Brief Narrative: [name] is a 77 y.o. female with a history of NASH cirrhosis, portal hypertensive gastropathy status post TIPS, thrombocytopenia, chronic respiratory failure, paroxysmal atrial fibrillation, diabetes mellitus type 2, diastolic heart failure, hypertension, CAD status post CABG, recurrent ESBL UTI.  Patient presented secondary to worsening confusion.  Patient found to be in atrial fibrillation with RVR in addition to having evidence of recurrent urinary tract infection.  Patient started on antibiotics for treatment and atrial fibrillation managed with metoprolol with control of high rates.  Hospitalization complicated by continued/recurrent altered mental status.   Assessment and Plan:  Acute metabolic encephalopathy Presumed multifactorial secondary to UTI and possibly COVID 19 infection.  Patient did have some improvement in mental status which seems to have worsened today per family. Possibly related to Seroquel. MRI obtained on 9/25 and was significant for evidence of possible PRES. ABG unremarkable for hypercapnia or acidemia. EEG without evidence of seizure activity. Normal vitamin B12 and folate. -Neurology recommendations: B1, RPR -Continue tele-sitter -Management of hepatic encephalopathy  Atrial fibrillation with RVR Not on anticoagulation per cardiology recommendations with history of variceal bleeding in addition to subdural and subarachnoid hemorrhage history. -Continue metoprolol  Acute combined systolic and diastolic heart failure Echocardiogram this admission significant for an LVEF of 25 to 30%.  Cardiology is consulted.  Secondary to comorbidities, recommendation for no left heart cath per cardiology.  Ability to diuresis complicated by hypotension and AKI. -Continue metoprolol  Delirium -Delirium  precautions  Dementia Likely vascular after hearing family description of recent cognitive decline. Would benefit from outpatient neurology follow-up based on on-going goals of care.  Subacute infarct Single punctate focus incidentally on MRI brain located at the anterior left frontal lobe concerning for a subacute infarct. Neurology consulted with recommendation for stroke workup. CTA head/neck significant for no emergent LVO or high-grade stenosis of the intracranial arteries. Neurology recommendations for aspirin monotherapy.  AKI Baseline creatinine of about 0.7-0.8. Creatinine of 1.28 on admission with peak of 1.76. Improved. Back down to 1.07.  UTI secondary to ESBL Klebsiella pneumoniae Patient completed a 5-day course of meropenem.  Hyperkalemia Mild. -Repeat potassium  COVID-19 infection Patient was treated with Paxlovid and completed course. Last day of isolation is 9/27.  Leg swelling LE venous duplex negative for acute DVT.  Chronic respiratory failure with hypoxia -Continue supplemental oxygen  Diabetes mellitus, type 2 Controlled with hyperglycemia from most recent hemoglobin A1C of 8.8% -Continue Semglee 10 units BID and SSI  CAD Stable.  Liver cirrhosis secondary to NASH Esophageal varices/portal hypertension s/p TIPS Spironolactone and furosemide held.  Hepatic encephalopathy Lactulose increased to TID with resultant watery diarrhea. -Decrease to lactulose 20 g BID and continue rifaximin -Recheck ammonia  Primary hypertension -Continue metoprolol  Hyperlipidemia Zetia held. LDL of 45.  Pancytopenia Likely related to acute illness. Resolved with improvement of WBC.  Goals of care Overall, prognosis seems poor.  Patient has underlying cognitive impairment and currently resides at long-term care.  Patient with new diagnosis of severe cardiomyopathy of unknown etiology at this time.  Patient with recurrent ESBL organism UTIs. Concern for patient's  quality of life at this time. -Palliative care consulted for goals of care  Pressure injury Mid sacrum.  Present on admission.   DVT prophylaxis: Lovenox Code Status:   Code Status: Limited: Do not attempt resuscitation (DNR) -DNR-LIMITED -Do Not Intubate/DNI  Family Communication: Son  and daughter-in-law at bedside Disposition Plan: Discharge possibly back to long term care depending on goals of care discussions   Consultants:  Cardiology  Procedures:  9/22: Transthoracic Echocardiogram   Antimicrobials: Meropenem    Subjective: Patient wants me to get a knife so she can cut herself loose from her lap belt.  Objective: BP 113/83 (BP Location: Left Arm)   Pulse (!) 112   Temp (!) 97.4 F (36.3 C) (Oral)   Resp 16   Ht 5\' 2"  (1.575 m)   Wt 67.5 kg   LMP  (LMP Unknown)   SpO2 91%   BMI 27.22 kg/m   Examination:  General exam: Appears calm and comfortable Respiratory system: Clear to auscultation. Respiratory effort normal. Cardiovascular system: S1 & S2 heard, RRR. Gastrointestinal system: Abdomen is nondistended, soft and nontender. Normal bowel sounds heard. Central nervous system: Alert and oriented to person and place. Musculoskeletal: No calf tenderness Skin: No cyanosis. No rashes   Data Reviewed: I have personally reviewed following labs and imaging studies  CBC Lab Results  Component Value Date   WBC 5.4 02/02/2023   RBC 3.83 (L) 02/02/2023   HGB 11.4 (L) 02/02/2023   HCT 36.1 02/02/2023   MCV 94.3 02/02/2023   MCH 29.8 02/02/2023   PLT 100 (L) 02/02/2023   MCHC 31.6 02/02/2023   RDW 17.4 (H) 02/02/2023   LYMPHSABS 0.9 02/02/2023   MONOABS 1.0 02/02/2023   EOSABS 0.1 02/02/2023   BASOSABS 0.1 02/02/2023     Last metabolic panel Lab Results  Component Value Date   NA 137 02/02/2023   K 4.6 02/02/2023   CL 107 02/02/2023   CO2 23 02/02/2023   BUN 26 (H) 02/02/2023   CREATININE 1.07 (H) 02/02/2023   GLUCOSE 117 (H) 02/02/2023    GFRNONAA 54 (L) 02/02/2023   GFRAA 86 10/13/2020   CALCIUM 8.5 (L) 02/02/2023   PHOS 2.7 09/09/2022   PROT 5.6 (L) 01/31/2023   ALBUMIN 2.2 (L) 01/31/2023   LABGLOB 3.5 07/24/2015   AGRATIO 1.1 (L) 07/24/2015   BILITOT 1.8 (H) 01/31/2023   ALKPHOS 119 01/31/2023   AST 63 (H) 01/31/2023   ALT 34 01/31/2023   ANIONGAP 7 02/02/2023    GFR: Estimated Creatinine Clearance: 40.3 mL/min (A) (by C-G formula based on SCr of 1.07 mg/dL (H)).  Recent Results (from the past 240 hour(s))  Blood culture (routine x 2)     Status: None   Collection Time: 01/28/23 11:01 AM   Specimen: BLOOD RIGHT ARM  Result Value Ref Range Status   Specimen Description BLOOD RIGHT ARM  Final   Special Requests   Final    BOTTLES DRAWN AEROBIC AND ANAEROBIC Blood Culture adequate volume   Culture   Final    NO GROWTH 5 DAYS Performed at [facility], [address] [place], [place] [address]    Report Status 02/02/2023 FINAL  Final  Blood culture (routine x 2)     Status: None   Collection Time: 01/28/23 11:02 AM   Specimen: BLOOD LEFT ARM  Result Value Ref Range Status   Specimen Description BLOOD LEFT ARM  Final   Special Requests   Final    BOTTLES DRAWN AEROBIC AND ANAEROBIC Blood Culture adequate volume   Culture   Final    NO GROWTH 5 DAYS Performed at [facility], [address], [place], [place] [address]    Report Status 02/02/2023 FINAL  Final  Urine Culture  Status: Abnormal   Collection Time: 01/28/23 11:12 AM   Specimen: Urine, Clean Catch  Result Value Ref Range Status   Specimen Description URINE, CLEAN CATCH  Final   Special Requests   Final    NONE Performed at [facility], [address], [place], [place] [address]    Culture (A)  Final    >=100,000 COLONIES/mL KLEBSIELLA PNEUMONIAE Confirmed Extended Spectrum Beta-Lactamase Producer (ESBL).  In bloodstream infections from ESBL organisms, carbapenems are preferred over piperacillin/tazobactam. They are  shown to have a lower risk of mortality. 30,000 COLONIES/mL KLEBSIELLA OXYTOCA    Report Status 01/31/2023 FINAL  Final   Organism ID, Bacteria KLEBSIELLA PNEUMONIAE (A)  Final   Organism ID, Bacteria KLEBSIELLA OXYTOCA (A)  Final      Susceptibility   Klebsiella oxytoca - MIC*    AMPICILLIN >=32 RESISTANT Resistant     CEFEPIME <=0.12 SENSITIVE Sensitive     CEFTRIAXONE <=0.25 SENSITIVE Sensitive     CIPROFLOXACIN <=0.25 SENSITIVE Sensitive     GENTAMICIN <=1 SENSITIVE Sensitive     IMIPENEM <=0.25 SENSITIVE Sensitive     NITROFURANTOIN 64 INTERMEDIATE Intermediate     TRIMETH/SULFA <=20 SENSITIVE Sensitive     AMPICILLIN/SULBACTAM 8 SENSITIVE Sensitive     PIP/TAZO <=4 SENSITIVE Sensitive     * 30,000 COLONIES/mL KLEBSIELLA OXYTOCA   Klebsiella pneumoniae - MIC*    AMPICILLIN >=32 RESISTANT Resistant     CEFAZOLIN >=64 RESISTANT Resistant     CEFEPIME >=32 RESISTANT Resistant     CEFTRIAXONE >=64 RESISTANT Resistant     CIPROFLOXACIN 2 RESISTANT Resistant     GENTAMICIN >=16 RESISTANT Resistant     IMIPENEM <=0.25 SENSITIVE Sensitive     NITROFURANTOIN 64 INTERMEDIATE Intermediate     TRIMETH/SULFA >=320 RESISTANT Resistant     AMPICILLIN/SULBACTAM >=32 RESISTANT Resistant     PIP/TAZO 8 SENSITIVE Sensitive     * >=100,000 COLONIES/mL KLEBSIELLA PNEUMONIAE      Radiology Studies: CT ANGIO HEAD W OR WO CONTRAST  Result Date: 02/04/2023 CLINICAL DATA:  Stroke follow-up EXAM: CT ANGIOGRAPHY HEAD TECHNIQUE: Multidetector CT imaging of the head was performed using the standard protocol during bolus administration of intravenous contrast. Multiplanar CT image reconstructions and MIPs were obtained to evaluate the vascular anatomy. RADIATION DOSE REDUCTION: This exam was performed according to the departmental dose-optimization program which includes automated exposure control, adjustment of the mA and/or kV according to patient size and/or use of iterative reconstruction technique.  CONTRAST:  75mL OMNIPAQUE IOHEXOL 350 MG/ML SOLN COMPARISON:  Head CT 01/28/2023 FINDINGS: CT HEAD Brain: No mass, hemorrhage or extra-axial collection. Old left parietal infarct. There is periventricular hypoattenuation compatible with chronic microvascular disease. Vascular: No hyperdense vessel or unexpected calcification. Skull: Normal. Negative for fracture or focal lesion. Sinuses: No acute or significant finding. Other: None. CTA HEAD POSTERIOR CIRCULATION: --Vertebral arteries: Normal --Inferior cerebellar arteries: Normal. --Basilar artery: Normal. --Superior cerebellar arteries: Normal. --Posterior cerebral arteries: Normal. ANTERIOR CIRCULATION: --Intracranial internal carotid arteries: Normal. --Anterior cerebral arteries (ACA): Normal. --Middle cerebral arteries (MCA): Normal. Venous sinuses: As permitted by contrast timing, patent. Anatomic variants: Both P comms are patent. There is a fetal type origin of the right PCA. Review of the MIP images confirms the above findings. IMPRESSION: 1. No emergent large vessel occlusion or high-grade stenosis of the intracranial arteries. 2. Old left parietal infarct and findings of chronic microvascular disease. Electronically Signed   By: Deatra Robinson M.D.   On: 02/04/2023 03:14   EEG  adult  Result Date: 02/03/2023 Charlsie Quest, MD     02/03/2023  9:09 AM Patient Name: Jaqua Kopplin MRN: [id number] Epilepsy Attending: Charlsie Quest Referring Physician/Provider: Erick Blinks, MD Date: 02/03/2023 Duration: 23.15 mins Patient history: 77yo F with traumatic subdural hematoma who presented to the emergency room from SNF for altered mental status less responsive to staff. EEG to evaluate for seizure Level of alertness: Awake, asleep AEDs during EEG study: None Technical aspects: This EEG study was done with scalp electrodes positioned according to the 10-20 International system of electrode placement. Electrical activity was reviewed with band  pass filter of 1-70Hz , sensitivity of 7 uV/mm, display speed of 47mm/sec with a 60Hz  notched filter applied as appropriate. EEG data were recorded continuously and digitally stored.  Video monitoring was available and reviewed as appropriate. Description: The posterior dominant rhythm consists of 7 Hz activity of moderate voltage (25-35 uV) seen predominantly in posterior head regions, symmetric and reactive to eye opening and eye closing. Sleep was characterized by vertex waves, sleep spindles (12 to 14 Hz), maximal frontocentral region. EEG showed continuous generalized 3 to 6 Hz theta-delta slowing. Hyperventilation and photic stimulation were not performed.   ABNORMALITY - Continuous slow, generalized IMPRESSION: This study is suggestive of moderate diffuse encephalopathy. No seizures or epileptiform discharges were seen throughout the recording. Charlsie Quest      LOS: 7 days    Jacquelin Hawking, MD [facility] 02/04/2023, 10:02 AM   If 7PM-7AM, please contact night-coverage [email or web address]

## 2023-02-04 NOTE — Evaluation (Signed)
Clinical/Bedside Swallow Evaluation Patient Details  Name: [name] MRN: [id number] Date of Birth: 06/22/1945  Today's Date: 02/04/2023 Time: SLP Start Time (ACUTE ONLY): 1030 SLP Stop Time (ACUTE ONLY): 1036 SLP Time Calculation (min) (ACUTE ONLY): 6 min  Past Medical History:  Past Medical History:  Diagnosis Date   Chronic cystitis    Chronic diastolic CHF (congestive heart failure) (HCC)    followed by dr Eden Emms   Chronic kidney disease, stage I    Cirrhosis of liver without ascites (HCC) 03/2016   followed by dr Christella Hartigan (GI);   due to fatty liver;   post op cabg 09/ 2017  , post paracentesis for ascites   Coronary artery disease 01/2016   cardiologist--- dr Estrella Myrtle;   01-27-2016  NSTEMI  s/p cardiac cath;   s/p CABG 01/29/16: LIMA-LAD, SVG-RI, SVG-dRCA   DDD (degenerative disc disease), cervical    per pt w/ chronic neck pain   Dyspnea    occasional   Edema of right lower extremity    Esophageal varices (HCC)    followed by dr Christella Hartigan;   hx multiple banding of large varices   Essential hypertension    no meds   GERD (gastroesophageal reflux disease)    Heart murmur     DX FOR YEARS ASYMPTOMATIC   Hepatic encephalopathy (HCC)    History of adenomatous polyp of colon    History of COVID-19 04/2020   per pt mild  symptoms that resolve   History of kidney stones    passed stones   History of non-ST elevation myocardial infarction (NSTEMI) 01/27/2016   Hyperlipidemia    diet controlled   NAFLD (nonalcoholic fatty liver disease)    Neuropathy, peripheral 04/01/2015   OA (osteoarthritis)    Paroxysmal A-fib (HCC)    08/19/19 afib with RVR, spontaneously converted to SR   Portal vein thrombosis    per dr Christella Hartigan  ,  chronic non-occluded   Thrombocytopenia (HCC)    followed by pcp  and hematology-- dr Mosetta Putt;   likely secondary to liver cirrhosis   Type 2 diabetes mellitus treated with insulin (HCC)    Type 2, followed by pcp    (07-21-2021  per pt checks blood  sugar dialy in am fasting average 113--200s)   Urge incontinence of urine    Wears hearing aid in both ears    Past Surgical History:  Past Surgical History:  Procedure Laterality Date   CARDIAC CATHETERIZATION N/A 01/27/2016   Procedure: Left Heart Cath and Coronary Angiography;  Surgeon: Lyn Records, MD;  Location: [facility] INVASIVE CV LAB;  Service: Cardiovascular;  Laterality: N/A;   COLONOSCOPY WITH PROPOFOL N/A 07/07/2016   Procedure: COLONOSCOPY WITH PROPOFOL;  Surgeon: Rachael Fee, MD;  Location: [facility] ENDOSCOPY;  Service: Endoscopy;  Laterality: N/A;   CORONARY ARTERY BYPASS GRAFT N/A 01/28/2016   Procedure: CORONARY ARTERY BYPASS GRAFTING (CABG) x 3 USING RIGHT LEG GREATER SAPHENOUS VEIN GRAFT;  Surgeon: Loreli Slot, MD;  Location: [facility] OR;  Service: Open Heart Surgery;  Laterality: N/A;   CYSTOSCOPY WITH RETROGRADE PYELOGRAM, URETEROSCOPY AND STENT PLACEMENT Left 05/30/2021   Procedure: CYSTOSCOPY WITH RETROGRADE PYELOGRAM, URETEROSCOPY AND STENT PLACEMENT;  Surgeon: Rene Paci, MD;  Location: [facility] OR;  Service: Urology;  Laterality: Left;   CYSTOSCOPY/URETEROSCOPY/HOLMIUM LASER/STENT PLACEMENT Left 07/26/2021   Procedure: CYSTOSCOPY/RETROGRADE/URETEROSCOPY/HOLMIUM LASER/STENT EXCHANGE;  Surgeon: Jannifer Hick, MD;  Location: [facility];  Service: Urology;  Laterality: Left;   ENDOVEIN HARVEST OF GREATER SAPHENOUS  VEIN Right 01/28/2016   Procedure: ENDOVEIN HARVEST OF GREATER SAPHENOUS VEIN;  Surgeon: Loreli Slot, MD;  Location: [facility] OR;  Service: Open Heart Surgery;  Laterality: Right;   ESOPHAGEAL BANDING  03/28/2019   Procedure: ESOPHAGEAL BANDING;  Surgeon: Rachael Fee, MD;  Location: [facility]OS[facility];  Service: Endoscopy;;   ESOPHAGEAL BANDING  04/07/2019   Procedure: ESOPHAGEAL BANDING;  Surgeon: Charna Somara, MD;  Location: [facility] ENDOSCOPY;  Service: Endoscopy;;   ESOPHAGOGASTRODUODENOSCOPY N/A 04/07/2019   Procedure:  ESOPHAGOGASTRODUODENOSCOPY (EGD);  Surgeon: Charna Acire, MD;  Location: [facility] ENDOSCOPY;  Service: Endoscopy;  Laterality: N/A;   ESOPHAGOGASTRODUODENOSCOPY (EGD) WITH PROPOFOL N/A 07/07/2016   Procedure: ESOPHAGOGASTRODUODENOSCOPY (EGD) WITH PROPOFOL;  Surgeon: Rachael Fee, MD;  Location: [facility];  Service: Endoscopy;  Laterality: N/A;   ESOPHAGOGASTRODUODENOSCOPY (EGD) WITH PROPOFOL N/A 03/28/2019   Procedure: ESOPHAGOGASTRODUODENOSCOPY (EGD) WITH PROPOFOL;  Surgeon: Rachael Fee, MD;  Location: [facility] ENDOSCOPY;  Service: Endoscopy;  Laterality: N/A;   EXTRACORPOREAL SHOCK WAVE LITHOTRIPSY  2010   HEMOSTASIS CLIP PLACEMENT  04/07/2019   Procedure: HEMOSTASIS CLIP PLACEMENT;  Surgeon: Charna Brazil, MD;  Location: [facility] ENDOSCOPY;  Service: Endoscopy;;   IR ANGIOGRAM SELECTIVE EACH ADDITIONAL VESSEL  04/08/2019   IR EMBO ART  VEN HEMORR LYMPH EXTRAV  INC GUIDE ROADMAPPING  04/08/2019   IR PARACENTESIS  04/08/2019   IR RADIOLOGIST EVAL & MGMT  06/13/2019   IR RADIOLOGIST EVAL & MGMT  09/25/2019   IR RADIOLOGIST EVAL & MGMT  07/07/2020   IR RADIOLOGIST EVAL & MGMT  07/30/2021   IR TIPS  04/08/2019   LAMINECTOMY WITH POSTERIOR LATERAL ARTHRODESIS LEVEL 1 Bilateral 02/25/2022   Procedure: Laminectomy and Foraminotomy - Lumbar four-Lumbar five - bilateral, posterolateral arthrodesis Lumbar four-five, extension of hardware Lumbar four-five;  Surgeon: Tia Alert, MD;  Location: [facility] OR;  Service: Neurosurgery;  Laterality: Bilateral;   MAXIMUM ACCESS (MAS)POSTERIOR LUMBAR INTERBODY FUSION (PLIF) 1 LEVEL Left 06/10/2015   Procedure: FOR MAXIMUM ACCESS (MAS) POSTERIOR LUMBAR INTERBODY FUSION (PLIF) LUMBAR THREE-FOUR EXTRAFORAMINAL MICRODISCECTOMY LUMBAR FIVE-SACRAL ONE LEFT;  Surgeon: Tia Alert, MD;  Location: [facility] NEURO ORS;  Service: Neurosurgery;  Laterality: Left;   RADIOLOGY WITH ANESTHESIA N/A 04/08/2019   Procedure: RADIOLOGY WITH ANESTHESIA;  Surgeon: Radiologist, Medication, MD;   Location: [facility] OR;  Service: Radiology;  Laterality: N/A;   SCLEROTHERAPY  04/07/2019   Procedure: SCLEROTHERAPY;  Surgeon: Charna Sheena, MD;  Location: [facility] ENDOSCOPY;  Service: Endoscopy;;   TEE WITHOUT CARDIOVERSION N/A 01/28/2016   Procedure: TRANSESOPHAGEAL ECHOCARDIOGRAM (TEE);  Surgeon: Loreli Slot, MD;  Location: [facility] OR;  Service: Open Heart Surgery;  Laterality: N/A;   TUBAL LIGATION  1982   Dr Ninetta Lights HERNIA REPAIR N/A 09/10/2019   Procedure: HERNIA REPAIR UMBILICAL ADULT;  Surgeon: Manus Rudd, MD;  Location: [facility] OR;  Service: General;  Laterality: N/A;   VAGINAL HYSTERECTOMY  1997   Dr Vivien Rota   HPI:  Sheyna Leedy is a 77 y.o. female who presented secondary to worsening confusion.  Patient found to be in atrial fibrillation with RVR in addition to having evidence of recurrent urinary tract infection. Hospitalization complicated by continued/recurrent altered mental status. MRI 9/25 suspicous for artifact and neurology has signed off.  Head CT 9/28 with no acute findings.  CXR 9/21: "Cardiomegaly with new bilateral pleural effusions and pulmonary  vascular congestion, compatible with congestive heart  failure/pulmonary edema. Correlate clinically for superimposed  pneumonia."  Pt with a history of NASH cirrhosis, portal hypertensive  gastropathy status post TIPS, thrombocytopenia, chronic respiratory failure, paroxysmal atrial fibrillation, diabetes mellitus type 2, diastolic heart failure, hypertension, CAD status post CABG, recurrent ESBL UTI.    Assessment / Plan / Recommendation  Clinical Impression  Pt presents with grossly normal swallowing function as assessed clinically.  Pt tolerated all consistencies trialed with no clinical s/s of aspiration and exhibited good oral clerance of solids.  Pt has no ST needs.  Recommend continuing regular texture diet with thin liquids.  SLP Visit Diagnosis: Dysphagia, unspecified (R13.10)    Aspiration Risk  No  limitations    Diet Recommendation Regular;Thin liquid    Liquid Administration via: Cup;Straw Medication Administration: Whole meds with liquid Supervision: Staff to assist with self feeding Compensations: Slow rate;Small sips/bites;Minimize environmental distractions Postural Changes: Seated upright at 90 degrees    Other  Recommendations Oral Care Recommendations: Oral care BID    Recommendations for follow up therapy are one component of a multi-disciplinary discharge planning process, led by the attending physician.  Recommendations may be updated based on patient status, additional functional criteria and insurance authorization.  Follow up Recommendations No SLP follow up      Assistance Recommended at Discharge  N/A  Functional Status Assessment Patient has not had a recent decline in their functional status  Frequency and Duration  (N/A)          Prognosis Prognosis for improved oropharyngeal function:  (N/A)      Swallow Study   General HPI: Yoni Sako is a 77 y.o. female who presented secondary to worsening confusion.  Patient found to be in atrial fibrillation with RVR in addition to having evidence of recurrent urinary tract infection. Hospitalization complicated by continued/recurrent altered mental status. MRI 9/25 suspicous for artifact and neurology has signed off.  Head CT 9/28 with no acute findings.  CXR 9/21: "Cardiomegaly with new bilateral pleural effusions and pulmonary  vascular congestion, compatible with congestive heart  failure/pulmonary edema. Correlate clinically for superimposed  pneumonia."  Pt with a history of NASH cirrhosis, portal hypertensive gastropathy status post TIPS, thrombocytopenia, chronic respiratory failure, paroxysmal atrial fibrillation, diabetes mellitus type 2, diastolic heart failure, hypertension, CAD status post CABG, recurrent ESBL UTI. Type of Study: Bedside Swallow Evaluation Previous Swallow Assessment: none Diet  Prior to this Study: Regular;Thin liquids (Level 0) Temperature Spikes Noted: No History of Recent Intubation: No Behavior/Cognition: Alert;Cooperative;Confused Oral Cavity Assessment: Within Functional Limits Oral Care Completed by SLP: No Oral Cavity - Dentition: Adequate natural dentition Self-Feeding Abilities: Needs assist Patient Positioning: Upright in bed Baseline Vocal Quality: Normal Volitional Cough: Strong Volitional Swallow: Able to elicit    Oral/Motor/Sensory Function Overall Oral Motor/Sensory Function: Within functional limits Facial ROM: Within Functional Limits Facial Symmetry: Within Functional Limits Lingual ROM: Within Functional Limits Lingual Symmetry: Within Functional Limits Lingual Strength: Reduced Velum: Within Functional Limits Mandible: Within Functional Limits   Ice Chips Ice chips: Not tested   Thin Liquid Thin Liquid: Within functional limits Presentation: Cup;Straw    Nectar Thick Nectar Thick Liquid: Not tested   Honey Thick Honey Thick Liquid: Not tested   Puree Puree: Within functional limits   Solid     Solid: Within functional limits      Kerrie Pleasure, MA, CCC-SLP Acute Rehabilitation Services Office: [phone] 02/04/2023,12:27 PM

## 2023-02-04 NOTE — Evaluation (Addendum)
Physical Therapy Evaluation Patient Details Name: [name] MRN: [id number] DOB: 03-11-1946 Today's Date: 02/04/2023  History of Present Illness  77 yo female presents to [facility] on 9/21 from [facility] with SOB, recent covid and PNA diagnosis. Workup for AME, HF, afib, UTI. Additionally, MRI brain shows questionable L anterior frontal lobe small punctate infarct, neurology stating probably due to artifact. PMH includes CHF, DM2, GERD, HTN, CAD s/p CABG x 3, statin induced rhabdomyolysis, chronic O2 use, IDA, traumatic subdural hematoma, lumbar fracture, liver cirrhosis secondary to NASH, pancytopenia, sacral pressure ulcer, HLD, hearing loss, osteoporosis, A-fib, esophageal varices, s/p TIPS  Clinical Impression   Pt presents with generalized weakness, impaired balance with history of falls, impaired activity tolerance, dyspnea on exertion with accompanying O2 desaturation. Pt to benefit from acute PT to address deficits. Pt requiring mod +2 assist for bed mobility and x2 transfers in and out of bed, pt complaining of fatigue and shortness of breath. Pt on RA upon arrival to room, SpO2 85-95% on RA with DOE 2/4 after return to supine, applied 1LO2 (which is pt's baseline) and pt satting 90-95% upon PT exit and RN notified. Pt from LTC, would benefit from rehabilitation post-acutely. PT to progress mobility as tolerated, and will continue to follow acutely.          If plan is discharge home, recommend the following: A lot of help with walking and/or transfers;A lot of help with bathing/dressing/bathroom   Can travel by private vehicle        Equipment Recommendations None recommended by PT  Recommendations for Other Services       Functional Status Assessment Patient has had a recent decline in their functional status and demonstrates the ability to make significant improvements in function in a reasonable and predictable amount of time.     Precautions / Restrictions  Precautions Precautions: Fall Restrictions Weight Bearing Restrictions: No      Mobility  Bed Mobility Overal bed mobility: Needs Assistance Bed Mobility: Supine to Sit, Sit to Supine     Supine to sit: Min assist, +2 for safety/equipment, HOB elevated Sit to supine: Mod assist, +2 for physical assistance, HOB elevated   General bed mobility comments: min-mod +2 for trunk and LE management, boost up in bed once returned to supine.    Transfers Overall transfer level: Needs assistance Equipment used: Rolling walker (2 wheels) Transfers: Sit to/from Stand, Bed to chair/wheelchair/BSC Sit to Stand: Mod assist, +2 physical assistance   Step pivot transfers: +2 physical assistance, Min assist       General transfer comment: mod +2 for power up, rise, and steadying with rise. stand x2, step pivot to/from chair in room with steadying and RW navigation assist.    Ambulation/Gait                  Stairs            Wheelchair Mobility     Tilt Bed    Modified Rankin (Stroke Patients Only)       Balance Overall balance assessment: Needs assistance, History of Falls Sitting-balance support: No upper extremity supported, Feet supported Sitting balance-Leahy Scale: Fair     Standing balance support: Bilateral upper extremity supported, During functional activity Standing balance-Leahy Scale: Poor Standing balance comment: posterior bias                             Pertinent Vitals/Pain Pain Assessment Pain Assessment: Faces  Faces Pain Scale: Hurts little more Pain Location: buttocks Pain Descriptors / Indicators: Grimacing, Guarding Pain Intervention(s): Limited activity within patient's tolerance, Monitored during session, Repositioned    Home Living Family/patient expects to be discharged to:: Skilled nursing facility (long term care)                        Prior Function Prior Level of Function : Patient poor historian/Family  not available             Mobility Comments: Per pt and chart review, pt mobilizes with RW       Extremity/Trunk Assessment   Upper Extremity Assessment Upper Extremity Assessment: Defer to OT evaluation    Lower Extremity Assessment Lower Extremity Assessment: Generalized weakness    Cervical / Trunk Assessment Cervical / Trunk Assessment: Kyphotic  Communication   Communication Communication: Hearing impairment  Cognition Arousal: Alert Behavior During Therapy: Flat affect Overall Cognitive Status: No family/caregiver present to determine baseline cognitive functioning Area of Impairment: Attention, Memory, Following commands, Safety/judgement, Problem solving                   Current Attention Level: Focused Memory: Decreased short-term memory, Decreased recall of precautions Following Commands: Follows one step commands with increased time Safety/Judgement: Decreased awareness of safety   Problem Solving: Slow processing, Decreased initiation, Difficulty sequencing, Requires verbal cues, Requires tactile cues          General Comments General comments (skin integrity, edema, etc.): breakdown noted on sacrum, pericare assist needed as pt soiled in stool. Pt on RA upon arrival to room, SpO2 85-95% on RA with DOE 2/4 after return to supine, applied 1LO2 (which is pt's baseline) and pt satting 90-95% upon PT exit and RN notified    Exercises     Assessment/Plan    PT Assessment Patient needs continued PT services  PT Problem List Decreased strength;Decreased mobility;Decreased activity tolerance;Decreased balance;Decreased knowledge of use of DME;Pain;Cardiopulmonary status limiting activity;Decreased safety awareness       PT Treatment Interventions DME instruction;Therapeutic activities;Gait training;Therapeutic exercise;Patient/family education;Balance training;Functional mobility training;Neuromuscular re-education    PT Goals (Current goals can be  found in the Care Plan section)  Acute Rehab PT Goals PT Goal Formulation: With patient Time For Goal Achievement: 02/18/23 Potential to Achieve Goals: Good    Frequency Min 1X/week     Co-evaluation PT/OT/SLP Co-Evaluation/Treatment: Yes Reason for Co-Treatment: For patient/therapist safety;To address functional/ADL transfers;Necessary to address cognition/behavior during functional activity           AM-PAC PT "6 Clicks" Mobility  Outcome Measure Help needed turning from your back to your side while in a flat bed without using bedrails?: A Little Help needed moving from lying on your back to sitting on the side of a flat bed without using bedrails?: A Lot Help needed moving to and from a bed to a chair (including a wheelchair)?: A Lot Help needed standing up from a chair using your arms (e.g., wheelchair or bedside chair)?: A Lot Help needed to walk in hospital room?: Total Help needed climbing 3-5 steps with a railing? : Total 6 Click Score: 11    End of Session Equipment Utilized During Treatment: Gait belt;Oxygen Activity Tolerance: Patient tolerated treatment well;Patient limited by fatigue Patient left: with restraints reapplied;with bed alarm set;in bed;with call bell/phone within reach;with nursing/sitter in room (sitter, waist restraint) Nurse Communication: Mobility status PT Visit Diagnosis: Other abnormalities of gait and mobility (R26.89);Muscle weakness (generalized) (M62.81)  Time: 2130-8657 PT Time Calculation (min) (ACUTE ONLY): 32 min   Charges:   PT Evaluation $PT Eval Low Complexity: 1 Low   PT General Charges $$ ACUTE PT VISIT: 1 Visit         Marye Round, PT DPT Acute Rehabilitation Services Secure Chat Preferred  Office [phone]   [name] E Christain [place] 02/04/2023, 1:36 PM

## 2023-02-05 DIAGNOSIS — Z7189 Other specified counseling: Secondary | ICD-10-CM | POA: Diagnosis not present

## 2023-02-05 DIAGNOSIS — G9341 Metabolic encephalopathy: Secondary | ICD-10-CM | POA: Diagnosis not present

## 2023-02-05 DIAGNOSIS — Z515 Encounter for palliative care: Secondary | ICD-10-CM | POA: Diagnosis not present

## 2023-02-05 LAB — GLUCOSE, CAPILLARY
Glucose-Capillary: 110 mg/dL — ABNORMAL HIGH (ref 70–99)
Glucose-Capillary: 202 mg/dL — ABNORMAL HIGH (ref 70–99)

## 2023-02-05 NOTE — Care Management Obs Status (Signed)
MEDICARE OBSERVATION STATUS NOTIFICATION   Patient Details  Name: [name] MRN: [id number] Date of Birth: [birth year]   Medicare Observation Status Notification Given:  Yes    Draper Gallon G., RN 02/05/2023, 2:17 PM

## 2023-02-05 NOTE — Progress Notes (Signed)
PROGRESS NOTE    [name]  EXB:[id number] DOB: 04-Mar-1946 DOA: 01/28/2023 PCP: Venita Sheffield, MD   Brief Narrative: [name] is a 77 y.o. female with a history of NASH cirrhosis, portal hypertensive gastropathy status post TIPS, thrombocytopenia, chronic respiratory failure, paroxysmal atrial fibrillation, diabetes mellitus type 2, diastolic heart failure, hypertension, CAD status post CABG, recurrent ESBL UTI.  Patient presented secondary to worsening confusion.  Patient found to be in atrial fibrillation with RVR in addition to having evidence of recurrent urinary tract infection.  Patient started on antibiotics for treatment and atrial fibrillation managed with metoprolol with control of high rates.  Hospitalization complicated by continued/recurrent altered mental status.   Assessment and Plan:  Acute metabolic encephalopathy Presumed multifactorial secondary to UTI and possibly COVID 19 infection.  Patient did have some improvement in mental status which seems to have worsened today per family. Possibly related to Seroquel. MRI obtained on 9/25 and was significant for evidence of possible PRES. ABG unremarkable for hypercapnia or acidemia. EEG without evidence of seizure activity. Normal vitamin B12 and folate. RPR non reactive. -Neurology recommendations: B1 -Continue sitter -Management of hepatic encephalopathy  Atrial fibrillation with RVR Not on anticoagulation per cardiology recommendations with history of variceal bleeding in addition to subdural and subarachnoid hemorrhage history. -Continue metoprolol  Acute combined systolic and diastolic heart failure Echocardiogram this admission significant for an LVEF of 25 to 30%.  Cardiology is consulted.  Secondary to comorbidities, recommendation for no left heart cath per cardiology.  Ability to diuresis complicated by hypotension and AKI. -Continue metoprolol  Delirium -Delirium  precautions  Dementia Likely vascular after hearing family description of recent cognitive decline. Would benefit from outpatient neurology follow-up based on on-going goals of care.  Subacute infarct Single punctate focus incidentally on MRI brain located at the anterior left frontal lobe concerning for a subacute infarct. Neurology consulted with recommendation for stroke workup. CTA head/neck significant for no emergent LVO or high-grade stenosis of the intracranial arteries. Neurology recommendations for aspirin monotherapy.  AKI Baseline creatinine of about 0.7-0.8. Creatinine of 1.28 on admission with peak of 1.76. Improved. Back down to baseline.  UTI secondary to ESBL Klebsiella pneumoniae Patient completed a 5-day course of meropenem.  Hyperkalemia Mild. Resolved on recheck.  COVID-19 infection Patient was treated with Paxlovid and completed course. Last day of isolation is 9/27.  Leg swelling LE venous duplex negative for acute DVT.  Chronic respiratory failure with hypoxia -Continue supplemental oxygen  Diabetes mellitus, type 2 Controlled with hyperglycemia from most recent hemoglobin A1C of 8.8% -Continue Semglee 10 units BID and SSI  CAD Stable.  Liver cirrhosis secondary to NASH Esophageal varices/portal hypertension s/p TIPS Spironolactone and furosemide held.  Hepatic encephalopathy Lactulose increased to TID with resultant watery diarrhea. -Continue lactulose 20 g BID and continue rifaximin  Primary hypertension -Continue metoprolol  Hyperlipidemia Zetia held. LDL of 45.  Pancytopenia Likely related to acute illness. Resolved with improvement of WBC.  Goals of care Overall, prognosis seems poor.  Patient has underlying cognitive impairment and currently resides at long-term care.  Patient with new diagnosis of severe cardiomyopathy of unknown etiology at this time.  Patient with recurrent ESBL organism UTIs. Concern for patient's quality of life at  this time. -Palliative care consulted for goals of care  Pressure injury Mid sacrum.  Present on admission.   DVT prophylaxis: Lovenox Code Status:   Code Status: Limited: Do not attempt resuscitation (DNR) -DNR-LIMITED -Do Not Intubate/DNI  Family Communication: None  at bedside. Disposition Plan: Discharge possibly back to long term care depending on goals of care discussions   Consultants:  Cardiology  Procedures:  9/22: Transthoracic Echocardiogram   Antimicrobials: Meropenem    Subjective: Patient without concerns this morning.  Objective: BP 116/66 (BP Location: Left Arm)   Pulse 92   Temp 97.8 F (36.6 C) (Oral)   Resp 16   Ht 5\' 2"  (1.575 m)   Wt 67.1 kg   LMP  (LMP Unknown)   SpO2 98%   BMI 27.06 kg/m   Examination:  General exam: Appears calm and comfortable Respiratory system: Clear to auscultation. Respiratory effort normal. Cardiovascular system: S1 & S2 heard, RRR.  Gastrointestinal system: Abdomen is nondistended, soft and nontender. Normal bowel sounds heard. Central nervous system: Alert and oriented to person and place. Musculoskeletal: Trace ankle edema.   Data Reviewed: I have personally reviewed following labs and imaging studies  CBC Lab Results  Component Value Date   WBC 5.4 02/02/2023   RBC 3.83 (L) 02/02/2023   HGB 11.4 (L) 02/02/2023   HCT 36.1 02/02/2023   MCV 94.3 02/02/2023   MCH 29.8 02/02/2023   PLT 100 (L) 02/02/2023   MCHC 31.6 02/02/2023   RDW 17.4 (H) 02/02/2023   LYMPHSABS 0.9 02/02/2023   MONOABS 1.0 02/02/2023   EOSABS 0.1 02/02/2023   BASOSABS 0.1 02/02/2023     Last metabolic panel Lab Results  Component Value Date   NA 137 02/04/2023   K 4.7 02/04/2023   CL 106 02/04/2023   CO2 23 02/04/2023   BUN 19 02/04/2023   CREATININE 0.86 02/04/2023   GLUCOSE 132 (H) 02/04/2023   GFRNONAA >60 02/04/2023   GFRAA 86 10/13/2020   CALCIUM 8.8 (L) 02/04/2023   PHOS 2.7 09/09/2022   PROT 6.2 (L) 02/04/2023    ALBUMIN 2.4 (L) 02/04/2023   LABGLOB 3.5 07/24/2015   AGRATIO 1.1 (L) 07/24/2015   BILITOT 3.1 (H) 02/04/2023   ALKPHOS 146 (H) 02/04/2023   AST 62 (H) 02/04/2023   ALT 31 02/04/2023   ANIONGAP 8 02/04/2023    GFR: Estimated Creatinine Clearance: 50 mL/min (by C-G formula based on SCr of 0.86 mg/dL).  Recent Results (from the past 240 hour(s))  Blood culture (routine x 2)     Status: None   Collection Time: 01/28/23 11:01 AM   Specimen: BLOOD RIGHT ARM  Result Value Ref Range Status   Specimen Description BLOOD RIGHT ARM  Final   Special Requests   Final    BOTTLES DRAWN AEROBIC AND ANAEROBIC Blood Culture adequate volume   Culture   Final    NO GROWTH 5 DAYS Performed at [facility], [address], [place], [place] [address]    Report Status 02/02/2023 FINAL  Final  Blood culture (routine x 2)     Status: None   Collection Time: 01/28/23 11:02 AM   Specimen: BLOOD LEFT ARM  Result Value Ref Range Status   Specimen Description BLOOD LEFT ARM  Final   Special Requests   Final    BOTTLES DRAWN AEROBIC AND ANAEROBIC Blood Culture adequate volume   Culture   Final    NO GROWTH 5 DAYS Performed at [facility], [address], [place], [place] [address]    Report Status 02/02/2023 FINAL  Final  Urine Culture     Status: Abnormal   Collection Time: 01/28/23 11:12 AM   Specimen: Urine, Clean Catch  Result Value Ref Range Status  Specimen Description URINE, CLEAN CATCH  Final   Special Requests   Final    NONE Performed at [facility], [address], [place], [place] [address]    Culture (A)  Final    >=100,000 COLONIES/mL KLEBSIELLA PNEUMONIAE Confirmed Extended Spectrum Beta-Lactamase Producer (ESBL).  In bloodstream infections from ESBL organisms, carbapenems are preferred over piperacillin/tazobactam. They are shown to have a lower risk of mortality. 30,000 COLONIES/mL KLEBSIELLA OXYTOCA    Report Status 01/31/2023 FINAL  Final   Organism ID,  Bacteria KLEBSIELLA PNEUMONIAE (A)  Final   Organism ID, Bacteria KLEBSIELLA OXYTOCA (A)  Final      Susceptibility   Klebsiella oxytoca - MIC*    AMPICILLIN >=32 RESISTANT Resistant     CEFEPIME <=0.12 SENSITIVE Sensitive     CEFTRIAXONE <=0.25 SENSITIVE Sensitive     CIPROFLOXACIN <=0.25 SENSITIVE Sensitive     GENTAMICIN <=1 SENSITIVE Sensitive     IMIPENEM <=0.25 SENSITIVE Sensitive     NITROFURANTOIN 64 INTERMEDIATE Intermediate     TRIMETH/SULFA <=20 SENSITIVE Sensitive     AMPICILLIN/SULBACTAM 8 SENSITIVE Sensitive     PIP/TAZO <=4 SENSITIVE Sensitive     * 30,000 COLONIES/mL KLEBSIELLA OXYTOCA   Klebsiella pneumoniae - MIC*    AMPICILLIN >=32 RESISTANT Resistant     CEFAZOLIN >=64 RESISTANT Resistant     CEFEPIME >=32 RESISTANT Resistant     CEFTRIAXONE >=64 RESISTANT Resistant     CIPROFLOXACIN 2 RESISTANT Resistant     GENTAMICIN >=16 RESISTANT Resistant     IMIPENEM <=0.25 SENSITIVE Sensitive     NITROFURANTOIN 64 INTERMEDIATE Intermediate     TRIMETH/SULFA >=320 RESISTANT Resistant     AMPICILLIN/SULBACTAM >=32 RESISTANT Resistant     PIP/TAZO 8 SENSITIVE Sensitive     * >=100,000 COLONIES/mL KLEBSIELLA PNEUMONIAE      Radiology Studies: CT ANGIO HEAD W OR WO CONTRAST  Result Date: 02/04/2023 CLINICAL DATA:  Stroke follow-up EXAM: CT ANGIOGRAPHY HEAD TECHNIQUE: Multidetector CT imaging of the head was performed using the standard protocol during bolus administration of intravenous contrast. Multiplanar CT image reconstructions and MIPs were obtained to evaluate the vascular anatomy. RADIATION DOSE REDUCTION: This exam was performed according to the departmental dose-optimization program which includes automated exposure control, adjustment of the mA and/or kV according to patient size and/or use of iterative reconstruction technique. CONTRAST:  75mL OMNIPAQUE IOHEXOL 350 MG/ML SOLN COMPARISON:  Head CT 01/28/2023 FINDINGS: CT HEAD Brain: No mass, hemorrhage or  extra-axial collection. Old left parietal infarct. There is periventricular hypoattenuation compatible with chronic microvascular disease. Vascular: No hyperdense vessel or unexpected calcification. Skull: Normal. Negative for fracture or focal lesion. Sinuses: No acute or significant finding. Other: None. CTA HEAD POSTERIOR CIRCULATION: --Vertebral arteries: Normal --Inferior cerebellar arteries: Normal. --Basilar artery: Normal. --Superior cerebellar arteries: Normal. --Posterior cerebral arteries: Normal. ANTERIOR CIRCULATION: --Intracranial internal carotid arteries: Normal. --Anterior cerebral arteries (ACA): Normal. --Middle cerebral arteries (MCA): Normal. Venous sinuses: As permitted by contrast timing, patent. Anatomic variants: Both P comms are patent. There is a fetal type origin of the right PCA. Review of the MIP images confirms the above findings. IMPRESSION: 1. No emergent large vessel occlusion or high-grade stenosis of the intracranial arteries. 2. Old left parietal infarct and findings of chronic microvascular disease. Electronically Signed   By: Deatra Robinson M.D.   On: 02/04/2023 03:14      LOS: 8 days    Jacquelin Hawking, MD [facility] 02/05/2023, 11:04 AM   If 7PM-7AM, please contact night-coverage  [email or web address]

## 2023-02-05 NOTE — Plan of Care (Signed)
  Problem: Education: Goal: Understanding of discharge needs will improve Outcome: Progressing   Problem: Activity: Goal: Ability to tolerate increased activity will improve Outcome: Progressing   Problem: Activity: Goal: Ability to avoid complications of mobility impairment will improve Outcome: Progressing   Problem: Activity: Goal: Will remain free from falls Outcome: Progressing

## 2023-02-05 NOTE — Progress Notes (Signed)
Daily Progress Note   Patient Name: [name]       Date: 02/05/2023 DOB: Sep 16, 1945  Age: 77 y.o. MRN#: [id number] Attending Physician: Narda Bonds, MD Primary Care Physician: Venita Sheffield, MD Admit Date: 01/28/2023  Reason for Consultation/Follow-up: Establishing goals of care  Length of Stay: 8  Current Medications: Scheduled Meds:   artificial tears  1 Application Both Eyes QHS   aspirin EC  81 mg Oral Daily   Chlorhexidine Gluconate Cloth  6 each Topical Daily   enoxaparin (LOVENOX) injection  40 mg Subcutaneous Q24H   insulin aspart  0-9 Units Subcutaneous TID WC   insulin glargine-yfgn  10 Units Subcutaneous BID   lactulose  20 g Oral BID   lidocaine  1 patch Transdermal Daily   metoprolol tartrate  25 mg Oral TID   polyethylene glycol  17 g Oral Daily   rifaximin  550 mg Oral BID    Continuous Infusions:   PRN Meds: acetaminophen **OR** acetaminophen, albuterol, bisacodyl  Physical Exam Vitals reviewed.  Constitutional:      General: She is sleeping.     Appearance: She is ill-appearing.     Interventions: Nasal cannula in place.  Cardiovascular:     Rate and Rhythm: Normal rate.  Pulmonary:     Effort: Pulmonary effort is normal.  Neurological:     Mental Status: She is disoriented.             Vital Signs: BP 116/66 (BP Location: Left Arm)   Pulse 92   Temp 97.8 F (36.6 C) (Oral)   Resp 16   Ht 5\' 2"  (1.575 m)   Wt 67.1 kg   LMP  (LMP Unknown)   SpO2 98%   BMI 27.06 kg/m  SpO2: SpO2: 98 % O2 Device: O2 Device: Nasal Cannula O2 Flow Rate: O2 Flow Rate (L/min): 2 L/min        Patient Active Problem List   Diagnosis Date Noted   Leg swelling 01/30/2023   Chronic respiratory failure with hypoxia (HCC) 01/29/2023    Acute cystitis 01/29/2023   Acute on chronic HFrEF (heart failure with reduced ejection fraction) (HCC) 01/28/2023   Atrial fibrillation with RVR (HCC) 01/28/2023   Leukopenia 11/27/2022   UTI (urinary tract infection) 10/13/2022   Acute metabolic encephalopathy 09/03/2022  Elevated troponin 09/03/2022   Hypocalcemia 09/03/2022   Pressure injury of skin 09/03/2022   Traumatic subdural hematoma (HCC) 08/21/2022   Prolonged QT interval 08/21/2022   Subarachnoid hemorrhage (HCC) 08/21/2022   Lumbar transverse process fracture (HCC) 08/21/2022   Fall at home 08/21/2022   Wound of left leg 08/21/2022   Pancytopenia (HCC) 08/21/2022   Mucocele of appendix 08/21/2022   Weight loss 08/21/2022   Vertigo 07/01/2022   Chronic diastolic CHF (congestive heart failure) (HCC) 07/01/2022   GAD (generalized anxiety disorder) 07/01/2022   Bilateral lower extremity edema 07/01/2022   Tenderness of left calf 07/01/2022   Left hip pain 07/01/2022   Hypoalbuminemia 07/01/2022   S/P lumbar fusion 02/25/2022   Pyohydronephrosis 05/29/2021   URI (upper respiratory infection) 05/12/2021   Lymphopenia 11/05/2020   CHF (congestive heart failure) (HCC)    Coronary artery disease    Type II diabetes mellitus (HCC)    COVID-19 virus infection 04/2020   A-fib (HCC) 08/19/2019   S/P TIPS (transjugular intrahepatic portosystemic shunt)    Hypercalcemia 05/26/2019   AKI (acute kidney injury) (HCC)    Hyponatremia    Acute lower UTI 05/16/2019   Acute delirium    General weakness 05/15/2019   Encephalopathy, hepatic (HCC) 05/15/2019   Volume depletion 05/15/2019   Bleeding esophageal varices (HCC)    UGI bleed    Infarction of spleen 04/06/2019   Odynophagia 04/01/2019   Dysphagia    Hepatoma (HCC) 01/31/2019   Thrombocytopenia (HCC) 09/25/2017   Left forearm pain 05/29/2017   Portal vein thrombosis 04/22/2017   Skin cancer of nose 08/23/2016   History of colonic polyps    Benign neoplasm of  rectum    Esophageal varices without bleeding (HCC)    Portal hypertensive gastropathy (HCC)    Leg cramps 05/18/2016   Routine lab draw 05/13/2016   Iron deficiency anemia 05/09/2016   Statin-induced rhabdomyolysis 05/07/2016   Liver cirrhosis secondary to NASH (HCC) 04/05/2016   PVC's (premature ventricular contractions) 04/05/2016   Hypokalemia 02/16/2016   Coronary artery disease of native artery of native heart with stable angina pectoris (HCC) 02/16/2016   S/P CABG x 3 02/02/2016   History of non-ST elevation myocardial infarction (NSTEMI) 01/27/2016   Trochanteric bursitis of left hip 12/15/2015   Type 2 diabetes mellitus without complication (HCC) 12/15/2015   S/P lumbar spinal fusion 06/10/2015   Paresthesia 04/01/2015   Osteoporosis 03/31/2015   Hearing loss 06/25/2014   Insomnia 06/25/2014   Hair thinning 06/25/2014   Muscle spasm 10/08/2013   Biceps tendon rupture, proximal 11/24/2012   Hyperlipidemia    GERD (gastroesophageal reflux disease)    Essential hypertension     Palliative Care Assessment & Plan   Patient Profile: 77 y.o. female  with past medical history of NASH cirrhosis, portal hypertensive gastropathy status post TIPS, thrombocytopenia, chronic respiratory failure, paroxysmal atrial fibrillation, diabetes mellitus type 2, diastolic heart failure, hypertension, CAD status post CABG, and recurrent ESBL UTI admitted on 01/28/2023 with AMS. Patient found to be in atrial fibrillation with RVR in addition to having evidence of recurrent urinary tract infection. Also concern for covid infection. Hospitalization complicated by ongoing AMS.   Assessment: Patient was asleep in bed in NAD. No family at bedside.  1410: Called son [name]. Provided answers to questions about hospice from yesterday. He and his family spoke more after our meeting.   They discussed that the decision to transition to hospice is harder for them because they still see her eating  and drinking,  she can still recognize them, and because they are hopeful for some meaningful improvement in her mental status. They also note that she would not want to live in her current condition. They believe she would chose a comfort path and that she may be accepting her dying time is approaching. She has been talking to deceased loved ones.  They would like some time for outcomes before making a transition to hospice. During this time her disposition options could also be sorted out.  Recommendations/Plan: DNR Limited scope Time for outcomes Continued conversations re: goals of care SW re: LTC and hospice and Medicaid status Continued PMT support   Code Status:    Code Status Orders  (From admission, onward)           Start     Ordered   01/28/23 1620  Do not attempt resuscitation (DNR)- Limited -Do Not Intubate (DNI)  (Code Status)  Continuous       Question Answer Comment  If pulseless and not breathing No CPR or chest compressions.   In Pre-Arrest Conditions (Patient Is Breathing and Has A Pulse) Do not intubate. Provide all appropriate non-invasive medical interventions. Avoid ICU transfer unless indicated or required.   Consent: Discussion documented in EHR or advanced directives reviewed      01/28/23 1620           Extensive chart review has been completed prior to seeing the patient and meeting with her family including labs, vital signs, imagine, progress/consult notes, orders, medications, and available advance directive documents.  Care plan was discussed with Dr. Caleb Popp  Time spent:  35 minutes  Thank you for allowing the Palliative Medicine Team to assist in the care of this patient.   Sherryll Burger, NP  Please contact Palliative Medicine Team phone at [phone] for questions and concerns.

## 2023-02-05 NOTE — Plan of Care (Signed)
  Problem: Activity: Goal: Will remain free from falls Outcome: Progressing   Problem: Clinical Measurements: Goal: Ability to maintain clinical measurements within normal limits will improve Outcome: Progressing Goal: Postoperative complications will be avoided or minimized Outcome: Progressing   Problem: Pain Management: Goal: Pain level will decrease Outcome: Progressing   Problem: Skin Integrity: Goal: Will show signs of wound healing Outcome: Progressing   Problem: Bladder/Genitourinary: Goal: Urinary functional status for postoperative course will improve Outcome: Progressing   Problem: Fluid Volume: Goal: Ability to maintain a balanced intake and output will improve Outcome: Progressing   Problem: Nutritional: Goal: Maintenance of adequate nutrition will improve Outcome: Progressing Goal: Progress toward achieving an optimal weight will improve Outcome: Progressing   Problem: Tissue Perfusion: Goal: Adequacy of tissue perfusion will improve Outcome: Progressing   Problem: Clinical Measurements: Goal: Will remain free from infection Outcome: Progressing

## 2023-02-06 DIAGNOSIS — Z515 Encounter for palliative care: Secondary | ICD-10-CM

## 2023-02-06 DIAGNOSIS — Z7189 Other specified counseling: Secondary | ICD-10-CM

## 2023-02-06 DIAGNOSIS — G9341 Metabolic encephalopathy: Secondary | ICD-10-CM | POA: Diagnosis not present

## 2023-02-06 LAB — GLUCOSE, CAPILLARY
Glucose-Capillary: 184 mg/dL — ABNORMAL HIGH (ref 70–99)
Glucose-Capillary: 207 mg/dL — ABNORMAL HIGH (ref 70–99)
Glucose-Capillary: 223 mg/dL — ABNORMAL HIGH (ref 70–99)
Glucose-Capillary: 248 mg/dL — ABNORMAL HIGH (ref 70–99)
Glucose-Capillary: 222 mg/dL — ABNORMAL HIGH (ref 70–99)

## 2023-02-06 NOTE — TOC Initial Note (Signed)
Transition of Care [facility]) - Initial/Assessment Note    Patient Details  Name: [name] MRN: [id number] Date of Birth: Dec 16, 1945  Transition of Care [facility]) CM/SW Contact:    [name], [place] Phone Number: 02/06/2023, 12:09 PM  Clinical Narrative:  SW spoke with pt's dtr [name] who confirms pt is a LTC resident at [facility] and the plan is for return with palliative vs hospice services. Per [name] with [employer] [facility]), pt's family reached out to them over weekend to discuss services. [facility] is able to provide hospice or palliative services at [facility]. Confirmed with [name] at [facility], pt is able to return with [facility] following at dc. SW will assist as indicated.     [name], MSW, LCSW [phone] (coverage)               Expected Discharge Plan: Skilled Nursing Facility Barriers to Discharge: Continued Medical Work up   Patient Goals and CMS Choice Patient states their goals for this hospitalization and ongoing recovery are::  (return to previous SNF) CMS Medicare.gov Compare Post Acute Care list provided to::  (return to previous SNF) Choice offered to / list presented to :  (return to previous SNF) Tea ownership interest in [facility].provided to::  (return to previous SNF)    Expected Discharge Plan and Services     Post Acute Care Choice: Skilled Nursing Facility Living arrangements for the past 2 months: Skilled Nursing Facility                                      Prior Living Arrangements/Services Living arrangements for the past 2 months: Skilled Nursing Facility Lives with:: Facility Resident Patient language and need for interpreter reviewed:: No        Need for Family Participation in Patient Care: Yes (Comment) Care giver support system in place?: Yes (comment)   Criminal Activity/Legal Involvement Pertinent to Current Situation/Hospitalization: No - Comment as needed  Activities of  Daily Living Home Assistive Devices/Equipment: Cane (specify quad or straight), Walker (specify type) ADL Screening (condition at time of admission) Does the patient have a NEW difficulty with bathing/dressing/toileting/self-feeding that is expected to last >3 days?: No Does the patient have a NEW difficulty with getting in/out of bed, walking, or climbing stairs that is expected to last >3 days?: No Does the patient have a NEW difficulty with communication that is expected to last >3 days?: No Is the patient deaf or have difficulty hearing?: Yes Does the patient have difficulty seeing, even when wearing glasses/contacts?: No Does the patient have difficulty concentrating, remembering, or making decisions?: Yes  Permission Sought/Granted Permission sought to share information with : Oceanographer granted to share information with : Yes, Verbal Permission Granted              Emotional Assessment       Orientation: : Fluctuating Orientation (Suspected and/or reported Sundowners) Alcohol / Substance Use: Not Applicable Psych Involvement: No (comment)  Admission diagnosis:  CHF exacerbation (HCC) [I50.9] Acute cystitis without hematuria [N30.00] Congestive heart failure (CHF) (HCC) [I50.9] AKI (acute kidney injury) (HCC) [N17.9] Atrial fibrillation with RVR (HCC) [I48.91] Acute congestive heart failure, unspecified heart failure type [facility]) [I50.9] Patient Active Problem List   Diagnosis Date Noted   Leg swelling 01/30/2023   Chronic respiratory failure with hypoxia (HCC) 01/29/2023   Acute cystitis 01/29/2023  Acute on chronic HFrEF (heart failure with reduced ejection fraction) (HCC) 01/28/2023   Atrial fibrillation with RVR (HCC) 01/28/2023   Leukopenia 11/27/2022   UTI (urinary tract infection) 10/13/2022   Acute metabolic encephalopathy 09/03/2022   Elevated troponin 09/03/2022   Hypocalcemia 09/03/2022   Pressure injury of skin 09/03/2022    Traumatic subdural hematoma (HCC) 08/21/2022   Prolonged QT interval 08/21/2022   Subarachnoid hemorrhage (HCC) 08/21/2022   Lumbar transverse process fracture (HCC) 08/21/2022   Fall at home 08/21/2022   Wound of left leg 08/21/2022   Pancytopenia (HCC) 08/21/2022   Mucocele of appendix 08/21/2022   Weight loss 08/21/2022   Vertigo 07/01/2022   Chronic diastolic CHF (congestive heart failure) (HCC) 07/01/2022   GAD (generalized anxiety disorder) 07/01/2022   Bilateral lower extremity edema 07/01/2022   Tenderness of left calf 07/01/2022   Left hip pain 07/01/2022   Hypoalbuminemia 07/01/2022   S/P lumbar fusion 02/25/2022   Pyohydronephrosis 05/29/2021   URI (upper respiratory infection) 05/12/2021   Lymphopenia 11/05/2020   CHF (congestive heart failure) (HCC)    Coronary artery disease    Type II diabetes mellitus (HCC)    COVID-19 virus infection 04/2020   A-fib (HCC) 08/19/2019   S/P TIPS (transjugular intrahepatic portosystemic shunt)    Hypercalcemia 05/26/2019   AKI (acute kidney injury) (HCC)    Hyponatremia    Acute lower UTI 05/16/2019   Acute delirium    General weakness 05/15/2019   Encephalopathy, hepatic (HCC) 05/15/2019   Volume depletion 05/15/2019   Bleeding esophageal varices (HCC)    UGI bleed    Infarction of spleen 04/06/2019   Odynophagia 04/01/2019   Dysphagia    Hepatoma (HCC) 01/31/2019   Thrombocytopenia (HCC) 09/25/2017   Left forearm pain 05/29/2017   Portal vein thrombosis 04/22/2017   Skin cancer of nose 08/23/2016   History of colonic polyps    Benign neoplasm of rectum    Esophageal varices without bleeding (HCC)    Portal hypertensive gastropathy (HCC)    Leg cramps 05/18/2016   Routine lab draw 05/13/2016   Iron deficiency anemia 05/09/2016   Statin-induced rhabdomyolysis 05/07/2016   Liver cirrhosis secondary to NASH (HCC) 04/05/2016   PVC's (premature ventricular contractions) 04/05/2016   Hypokalemia 02/16/2016   Coronary  artery disease of native artery of native heart with stable angina pectoris (HCC) 02/16/2016   S/P CABG x 3 02/02/2016   History of non-ST elevation myocardial infarction (NSTEMI) 01/27/2016   Trochanteric bursitis of left hip 12/15/2015   Type 2 diabetes mellitus without complication (HCC) 12/15/2015   S/P lumbar spinal fusion 06/10/2015   Paresthesia 04/01/2015   Osteoporosis 03/31/2015   Hearing loss 06/25/2014   Insomnia 06/25/2014   Hair thinning 06/25/2014   Muscle spasm 10/08/2013   Biceps tendon rupture, proximal 11/24/2012   Hyperlipidemia    GERD (gastroesophageal reflux disease)    Essential hypertension    PCP:  Venita Sheffield, MD Pharmacy:   [id number] [place], [place] - [address] AT [facility] [address] [place] [address] Phone: [phone] Fax: [phone]     Social Determinants of Health (SDOH) Social History: SDOH Screenings   Food Insecurity: Patient Unable To Answer (01/28/2023)  Housing: High Risk (01/28/2023)  Transportation Needs: Patient Unable To Answer (01/28/2023)  Utilities: Patient Unable To Answer (01/28/2023)  Alcohol Screen: Low Risk  (06/04/2018)  Depression (PHQ2-9): Low Risk  (08/15/2022)  Financial Resource Strain:  Low Risk  (06/05/2022)   Received from [facility]  Physical Activity: Inactive (05/29/2017)  Social Connections: Unknown (06/02/2022)   Received from [facility], [facility]  Stress: No Stress Concern Present (06/05/2022)   Received from [facility], [facility]  Tobacco Use: Low Risk  (01/28/2023)   SDOH Interventions:     Readmission Risk Interventions    10/18/2022   10:27 AM 07/03/2022   12:13 PM  Readmission Risk Prevention Plan  Transportation Screening Complete Complete  Medication Review (RN Care Manager) Complete Not Complete  PCP or Specialist appointment within 3-5 days of discharge Complete Complete  HRI or Home  Care Consult Complete   SW Recovery Care/Counseling Consult Complete Complete  Palliative Care Screening Not Applicable Not Complete  Comments  patient will go to skilled nursing  Skilled Nursing Facility Complete Complete

## 2023-02-06 NOTE — Progress Notes (Signed)
Daily Progress Note   Patient Name: [name]       Date: 02/06/2023 DOB: February 03, 1946  Age: 77 y.o. MRN#: [id number] Attending Physician: Narda Bonds, MD Primary Care Physician: Venita Sheffield, MD Admit Date: 01/28/2023  Reason for Consultation/Follow-up: Establishing goals of care  Length of Stay: 9  Current Medications: Scheduled Meds:   artificial tears  1 Application Both Eyes QHS   aspirin EC  81 mg Oral Daily   enoxaparin (LOVENOX) injection  40 mg Subcutaneous Q24H   insulin aspart  0-9 Units Subcutaneous TID WC   insulin glargine-yfgn  10 Units Subcutaneous BID   lactulose  20 g Oral BID   lidocaine  1 patch Transdermal Daily   metoprolol tartrate  25 mg Oral TID   polyethylene glycol  17 g Oral Daily   rifaximin  550 mg Oral BID    Continuous Infusions:   PRN Meds: acetaminophen **OR** acetaminophen, albuterol, bisacodyl  Physical Exam Vitals reviewed.  Constitutional:      General: She is sleeping.     Appearance: She is ill-appearing.     Interventions: Nasal cannula in place.  Cardiovascular:     Rate and Rhythm: Normal rate.  Pulmonary:     Effort: Pulmonary effort is normal.             Vital Signs: BP 116/71 (BP Location: Left Arm)   Pulse 80   Temp (!) 97.5 F (36.4 C) (Oral)   Resp (!) 21   Ht 5\' 2"  (1.575 m)   Wt 68.3 kg   LMP  (LMP Unknown)   SpO2 99%   BMI 27.54 kg/m  SpO2: SpO2: 99 % O2 Device: O2 Device: Nasal Cannula O2 Flow Rate: O2 Flow Rate (L/min): 2 L/min        Patient Active Problem List   Diagnosis Date Noted   Leg swelling 01/30/2023   Chronic respiratory failure with hypoxia (HCC) 01/29/2023   Acute cystitis 01/29/2023   Acute on chronic HFrEF (heart failure with reduced ejection fraction) (HCC)  01/28/2023   Atrial fibrillation with RVR (HCC) 01/28/2023   Leukopenia 11/27/2022   UTI (urinary tract infection) 10/13/2022   Acute metabolic encephalopathy 09/03/2022   Elevated troponin 09/03/2022   Hypocalcemia 09/03/2022   Pressure injury of skin 09/03/2022   Traumatic subdural hematoma (  HCC) 08/21/2022   Prolonged QT interval 08/21/2022   Subarachnoid hemorrhage (HCC) 08/21/2022   Lumbar transverse process fracture (HCC) 08/21/2022   Fall at home 08/21/2022   Wound of left leg 08/21/2022   Pancytopenia (HCC) 08/21/2022   Mucocele of appendix 08/21/2022   Weight loss 08/21/2022   Vertigo 07/01/2022   Chronic diastolic CHF (congestive heart failure) (HCC) 07/01/2022   GAD (generalized anxiety disorder) 07/01/2022   Bilateral lower extremity edema 07/01/2022   Tenderness of left calf 07/01/2022   Left hip pain 07/01/2022   Hypoalbuminemia 07/01/2022   S/P lumbar fusion 02/25/2022   Pyohydronephrosis 05/29/2021   URI (upper respiratory infection) 05/12/2021   Lymphopenia 11/05/2020   CHF (congestive heart failure) (HCC)    Coronary artery disease    Type II diabetes mellitus (HCC)    COVID-19 virus infection 04/2020   A-fib (HCC) 08/19/2019   S/P TIPS (transjugular intrahepatic portosystemic shunt)    Hypercalcemia 05/26/2019   AKI (acute kidney injury) (HCC)    Hyponatremia    Acute lower UTI 05/16/2019   Acute delirium    General weakness 05/15/2019   Encephalopathy, hepatic (HCC) 05/15/2019   Volume depletion 05/15/2019   Bleeding esophageal varices (HCC)    UGI bleed    Infarction of spleen 04/06/2019   Odynophagia 04/01/2019   Dysphagia    Hepatoma (HCC) 01/31/2019   Thrombocytopenia (HCC) 09/25/2017   Left forearm pain 05/29/2017   Portal vein thrombosis 04/22/2017   Skin cancer of nose 08/23/2016   History of colonic polyps    Benign neoplasm of rectum    Esophageal varices without bleeding (HCC)    Portal hypertensive gastropathy (HCC)    Leg cramps  05/18/2016   Routine lab draw 05/13/2016   Iron deficiency anemia 05/09/2016   Statin-induced rhabdomyolysis 05/07/2016   Liver cirrhosis secondary to NASH (HCC) 04/05/2016   PVC's (premature ventricular contractions) 04/05/2016   Hypokalemia 02/16/2016   Coronary artery disease of native artery of native heart with stable angina pectoris (HCC) 02/16/2016   S/P CABG x 3 02/02/2016   History of non-ST elevation myocardial infarction (NSTEMI) 01/27/2016   Trochanteric bursitis of left hip 12/15/2015   Type 2 diabetes mellitus without complication (HCC) 12/15/2015   S/P lumbar spinal fusion 06/10/2015   Paresthesia 04/01/2015   Osteoporosis 03/31/2015   Hearing loss 06/25/2014   Insomnia 06/25/2014   Hair thinning 06/25/2014   Muscle spasm 10/08/2013   Biceps tendon rupture, proximal 11/24/2012   Hyperlipidemia    GERD (gastroesophageal reflux disease)    Essential hypertension     Palliative Care Assessment & Plan   Patient Profile: 77 y.o. female  with past medical history of NASH cirrhosis, portal hypertensive gastropathy status post TIPS, thrombocytopenia, chronic respiratory failure, paroxysmal atrial fibrillation, diabetes mellitus type 2, diastolic heart failure, hypertension, CAD status post CABG, and recurrent ESBL UTI admitted on 01/28/2023 with AMS. Patient found to be in atrial fibrillation with RVR in addition to having evidence of recurrent urinary tract infection. Also concern for covid infection. Hospitalization complicated by ongoing AMS.   Assessment: Patient was sitting up in bed holding her husbands hand. Daughter [name] and family at bedside. Patient looks better today than yesterday. She states she is doing good except her bottom hurts-- [name] adds she has a pressure injury and wound care is coming to assess. Family is happy she is more herself today. They do not have any concerns or questions at this time.  SW was able to speak to the  family re: LTC and medicaid  status. Patient is already LTC and can return when ready.  Family plans to make decision re: hospice or palliative at discharge as they see patients status this week.  Recommendations/Plan: DNR Limited scope Time for outcomes Continued conversations re: goals of care SW re: LTC and hospice and Medicaid status Continued PMT support   Code Status:    Code Status Orders  (From admission, onward)           Start     Ordered   01/28/23 1620  Do not attempt resuscitation (DNR)- Limited -Do Not Intubate (DNI)  (Code Status)  Continuous       Question Answer Comment  If pulseless and not breathing No CPR or chest compressions.   In Pre-Arrest Conditions (Patient Is Breathing and Has A Pulse) Do not intubate. Provide all appropriate non-invasive medical interventions. Avoid ICU transfer unless indicated or required.   Consent: Discussion documented in EHR or advanced directives reviewed      01/28/23 1620           Extensive chart review has been completed prior to seeing the patient and meeting with her family including labs, vital signs, imagine, progress/consult notes, orders, medications, and available advance directive documents.  Care plan was discussed with SW and [facility] Li[facility]  Time spent:  35 minutes  Thank you for allowing the Palliative Medicine Team to assist in the care of this patient.   Sherryll Burger, NP  Please contact Palliative Medicine Team phone at [phone] for questions and concerns.

## 2023-02-06 NOTE — Plan of Care (Signed)
  Problem: Education: Goal: Knowledge of the prescribed therapeutic regimen will improve Outcome: Progressing   Problem: Education: Goal: Ability to verbalize activity precautions or restrictions will improve Outcome: Progressing   Problem: Education: Goal: Understanding of discharge needs will improve Outcome: Progressing

## 2023-02-06 NOTE — Consult Note (Signed)
WOC Nurse Consult Note: Reason for Consult: pressure injury Wound type: Stage 2 Pressure injury Pressure Injury POA: Yes Measurement: see nursing flow sheet Wound [id number], pink, moist Drainage (amount, consistency, odor) none Periwound: intact  Dressing procedure/placement/frequency: Silicone foam dressings to the sacral wound,  change every 3 days. ASSESS UNDER dressings each shift for any acute changes in the wounds.

## 2023-02-06 NOTE — Care Management Important Message (Signed)
Important Message  Patient Details  Name: [name] MRN: [id number] Date of Birth: May 25, 1945   Important Message Given:  Yes - Medicare IM     Dorena Bodo 02/06/2023, 3:16 PM

## 2023-02-06 NOTE — Progress Notes (Signed)
PROGRESS NOTE    [name]  RKY:[id number] DOB: Feb 27, 1946 DOA: 01/28/2023 PCP: Venita Sheffield, MD   Brief Narrative: [name] is a 77 y.o. female with a history of NASH cirrhosis, portal hypertensive gastropathy status post TIPS, thrombocytopenia, chronic respiratory failure, paroxysmal atrial fibrillation, diabetes mellitus type 2, diastolic heart failure, hypertension, CAD status post CABG, recurrent ESBL UTI.  Patient presented secondary to worsening confusion.  Patient found to be in atrial fibrillation with RVR in addition to having evidence of recurrent urinary tract infection.  Patient started on antibiotics for treatment and atrial fibrillation managed with metoprolol with control of high rates.  Hospitalization complicated by continued/recurrent altered mental status.   Assessment and Plan:  Acute metabolic encephalopathy Presumed multifactorial secondary to UTI and possibly COVID 19 infection.  Patient did have some improvement in mental status which seems to have worsened today per family. Possibly related to Seroquel. MRI obtained on 9/25 and was significant for evidence of possible PRES. ABG unremarkable for hypercapnia or acidemia. EEG without evidence of seizure activity. Normal vitamin B12 and folate. RPR non reactive. -Neurology recommendations: B1 -Will likely discontinue sitter in anticipation of readiness for discharge -Management of hepatic encephalopathy  Atrial fibrillation with RVR Not on anticoagulation per cardiology recommendations with history of variceal bleeding in addition to subdural and subarachnoid hemorrhage history. -Continue metoprolol  Acute combined systolic and diastolic heart failure Echocardiogram this admission significant for an LVEF of 25 to 30%.  Cardiology is consulted.  Secondary to comorbidities, recommendation for no left heart cath per cardiology.  Ability to diuresis complicated by hypotension and  AKI. -Continue metoprolol  Delirium -Delirium precautions  Dementia Likely vascular after hearing family description of recent cognitive decline. Would benefit from outpatient neurology follow-up based on on-going goals of care.  Subacute infarct Single punctate focus incidentally on MRI brain located at the anterior left frontal lobe concerning for a subacute infarct. Neurology consulted with recommendation for stroke workup. CTA head/neck significant for no emergent LVO or high-grade stenosis of the intracranial arteries. Neurology recommendations for aspirin monotherapy.  AKI Baseline creatinine of about 0.7-0.8. Creatinine of 1.28 on admission with peak of 1.76. Improved. Back down to baseline.  UTI secondary to ESBL Klebsiella pneumoniae Patient completed a 5-day course of meropenem.  Hyperkalemia Mild. Resolved on recheck.  COVID-19 infection Patient was treated with Paxlovid and completed course. Last day of isolation is 9/27.  Leg swelling LE venous duplex negative for acute DVT.  Chronic respiratory failure with hypoxia -Continue supplemental oxygen  Diabetes mellitus, type 2 Controlled with hyperglycemia from most recent hemoglobin A1C of 8.8% -Continue Semglee 10 units BID and SSI  CAD Stable.  Liver cirrhosis secondary to NASH Esophageal varices/portal hypertension s/p TIPS Spironolactone and furosemide held.  Hepatic encephalopathy Lactulose increased to TID with resultant watery diarrhea. Decreased to lactulose BID dosing. -Continue lactulose 20 g BID and continue rifaximin  Primary hypertension -Continue metoprolol  Hyperlipidemia Zetia held. LDL of 45.  Pancytopenia Likely related to acute illness. Resolved with improvement of WBC.  Goals of care Overall, prognosis seems poor.  Patient has underlying cognitive impairment and currently resides at long-term care.  Patient with new diagnosis of severe cardiomyopathy of unknown etiology at this time.   Patient with recurrent ESBL organism UTIs. Concern for patient's quality of life at this time. -Palliative care consulted for goals of care  Pressure injury Mid sacrum.  Present on admission.   DVT prophylaxis: Lovenox Code Status:   Code Status: Limited:  Do not attempt resuscitation (DNR) -DNR-LIMITED -Do Not Intubate/DNI  Family Communication: Husband, daughter and granddaughter at bedside. Disposition Plan: Discharge to LTC likely in 24-48 hours if remains improved   Consultants:  Cardiology  Procedures:  9/22: Transthoracic Echocardiogram   Antimicrobials: Meropenem    Subjective: Patient with no specific concerns today. Patient was talking about her her lactulose is causing her frequent bowel movements.  Objective: BP 116/71 (BP Location: Left Arm)   Pulse 80   Temp (!) 97.5 F (36.4 C) (Oral)   Resp (!) 21   Ht 5\' 2"  (1.575 m)   Wt 68.3 kg   LMP  (LMP Unknown)   SpO2 99%   BMI 27.54 kg/m   Examination:  General exam: Appears calm and comfortable Respiratory system: Clear to auscultation. Respiratory effort normal. Cardiovascular system: S1 & S2 heard, RRR. Gastrointestinal system: Abdomen is nondistended, soft and nontender. Normal bowel sounds heard. Central nervous system: Alert and oriented to person, but not place.  Musculoskeletal: No edema. No calf tenderness Skin: No cyanosis. No rashes   Data Reviewed: I have personally reviewed following labs and imaging studies  CBC Lab Results  Component Value Date   WBC 5.4 02/02/2023   RBC 3.83 (L) 02/02/2023   HGB 11.4 (L) 02/02/2023   HCT 36.1 02/02/2023   MCV 94.3 02/02/2023   MCH 29.8 02/02/2023   PLT 100 (L) 02/02/2023   MCHC 31.6 02/02/2023   RDW 17.4 (H) 02/02/2023   LYMPHSABS 0.9 02/02/2023   MONOABS 1.0 02/02/2023   EOSABS 0.1 02/02/2023   BASOSABS 0.1 02/02/2023     Last metabolic panel Lab Results  Component Value Date   NA 137 02/04/2023   K 4.7 02/04/2023   CL 106 02/04/2023    CO2 23 02/04/2023   BUN 19 02/04/2023   CREATININE 0.86 02/04/2023   GLUCOSE 132 (H) 02/04/2023   GFRNONAA >60 02/04/2023   GFRAA 86 10/13/2020   CALCIUM 8.8 (L) 02/04/2023   PHOS 2.7 09/09/2022   PROT 6.2 (L) 02/04/2023   ALBUMIN 2.4 (L) 02/04/2023   LABGLOB 3.5 07/24/2015   AGRATIO 1.1 (L) 07/24/2015   BILITOT 3.1 (H) 02/04/2023   ALKPHOS 146 (H) 02/04/2023   AST 62 (H) 02/04/2023   ALT 31 02/04/2023   ANIONGAP 8 02/04/2023    GFR: Estimated Creatinine Clearance: 50.4 mL/min (by C-G formula based on SCr of 0.86 mg/dL).  Recent Results (from the past 240 hour(s))  Blood culture (routine x 2)     Status: None   Collection Time: 01/28/23 11:01 AM   Specimen: BLOOD RIGHT ARM  Result Value Ref Range Status   Specimen Description BLOOD RIGHT ARM  Final   Special Requests   Final    BOTTLES DRAWN AEROBIC AND ANAEROBIC Blood Culture adequate volume   Culture   Final    NO GROWTH 5 DAYS Performed at [facility], [address] [place], [place] [address]    Report Status 02/02/2023 FINAL  Final  Blood culture (routine x 2)     Status: None   Collection Time: 01/28/23 11:02 AM   Specimen: BLOOD LEFT ARM  Result Value Ref Range Status   Specimen Description BLOOD LEFT ARM  Final   Special Requests   Final    BOTTLES DRAWN AEROBIC AND ANAEROBIC Blood Culture adequate volume   Culture   Final    NO GROWTH 5 DAYS Performed at [facility], [address], [place], [place] [address]  Report Status 02/02/2023 FINAL  Final  Urine Culture     Status: Abnormal   Collection Time: 01/28/23 11:12 AM   Specimen: Urine, Clean Catch  Result Value Ref Range Status   Specimen Description URINE, CLEAN CATCH  Final   Special Requests   Final    NONE Performed at [facility], [address], [place], [place] [address]    Culture (A)  Final    >=100,000 COLONIES/mL KLEBSIELLA PNEUMONIAE Confirmed Extended Spectrum Beta-Lactamase Producer (ESBL).  In bloodstream  infections from ESBL organisms, carbapenems are preferred over piperacillin/tazobactam. They are shown to have a lower risk of mortality. 30,000 COLONIES/mL KLEBSIELLA OXYTOCA    Report Status 01/31/2023 FINAL  Final   Organism ID, Bacteria KLEBSIELLA PNEUMONIAE (A)  Final   Organism ID, Bacteria KLEBSIELLA OXYTOCA (A)  Final      Susceptibility   Klebsiella oxytoca - MIC*    AMPICILLIN >=32 RESISTANT Resistant     CEFEPIME <=0.12 SENSITIVE Sensitive     CEFTRIAXONE <=0.25 SENSITIVE Sensitive     CIPROFLOXACIN <=0.25 SENSITIVE Sensitive     GENTAMICIN <=1 SENSITIVE Sensitive     IMIPENEM <=0.25 SENSITIVE Sensitive     NITROFURANTOIN 64 INTERMEDIATE Intermediate     TRIMETH/SULFA <=20 SENSITIVE Sensitive     AMPICILLIN/SULBACTAM 8 SENSITIVE Sensitive     PIP/TAZO <=4 SENSITIVE Sensitive     * 30,000 COLONIES/mL KLEBSIELLA OXYTOCA   Klebsiella pneumoniae - MIC*    AMPICILLIN >=32 RESISTANT Resistant     CEFAZOLIN >=64 RESISTANT Resistant     CEFEPIME >=32 RESISTANT Resistant     CEFTRIAXONE >=64 RESISTANT Resistant     CIPROFLOXACIN 2 RESISTANT Resistant     GENTAMICIN >=16 RESISTANT Resistant     IMIPENEM <=0.25 SENSITIVE Sensitive     NITROFURANTOIN 64 INTERMEDIATE Intermediate     TRIMETH/SULFA >=320 RESISTANT Resistant     AMPICILLIN/SULBACTAM >=32 RESISTANT Resistant     PIP/TAZO 8 SENSITIVE Sensitive     * >=100,000 COLONIES/mL KLEBSIELLA PNEUMONIAE      Radiology Studies: No results found.    LOS: 9 days    Jacquelin Hawking, MD [facility] 02/06/2023, 12:40 PM   If 7PM-7AM, please contact night-coverage [email or web address]

## 2023-02-06 NOTE — Plan of Care (Signed)
  Problem: Education: Goal: Ability to verbalize activity precautions or restrictions will improve Outcome: Progressing Goal: Knowledge of the prescribed therapeutic regimen will improve Outcome: Progressing Goal: Understanding of discharge needs will improve Outcome: Progressing   Problem: Activity: Goal: Ability to avoid complications of mobility impairment will improve Outcome: Progressing Goal: Ability to tolerate increased activity will improve Outcome: Progressing Goal: Will remain free from falls Outcome: Progressing   Problem: Bowel/Gastric: Goal: Gastrointestinal status for postoperative course will improve Outcome: Progressing   Problem: Clinical Measurements: Goal: Ability to maintain clinical measurements within normal limits will improve Outcome: Progressing Goal: Postoperative complications will be avoided or minimized Outcome: Progressing Goal: Diagnostic test results will improve Outcome: Progressing   Problem: Pain Management: Goal: Pain level will decrease Outcome: Progressing   Problem: Skin Integrity: Goal: Will show signs of wound healing Outcome: Progressing   Problem: Health Behavior/Discharge Planning: Goal: Identification of resources available to assist in meeting health care needs will improve Outcome: Progressing   Problem: Bladder/Genitourinary: Goal: Urinary functional status for postoperative course will improve Outcome: Progressing   Problem: Education: Goal: Ability to describe self-care measures that may prevent or decrease complications (Diabetes Survival Skills Education) will improve Outcome: Progressing Goal: Individualized Educational Video(s) Outcome: Progressing   Problem: Coping: Goal: Ability to adjust to condition or change in health will improve Outcome: Progressing   Problem: Fluid Volume: Goal: Ability to maintain a balanced intake and output will improve Outcome: Progressing   Problem: Health Behavior/Discharge  Planning: Goal: Ability to identify and utilize available resources and services will improve Outcome: Progressing Goal: Ability to manage health-related needs will improve Outcome: Progressing   Problem: Metabolic: Goal: Ability to maintain appropriate glucose levels will improve Outcome: Progressing   Problem: Nutritional: Goal: Maintenance of adequate nutrition will improve Outcome: Progressing Goal: Progress toward achieving an optimal weight will improve Outcome: Progressing   Problem: Skin Integrity: Goal: Risk for impaired skin integrity will decrease Outcome: Progressing   Problem: Tissue Perfusion: Goal: Adequacy of tissue perfusion will improve Outcome: Progressing   Problem: Education: Goal: Knowledge of General Education information will improve Description: Including pain rating scale, medication(s)/side effects and non-pharmacologic comfort measures Outcome: Progressing   Problem: Health Behavior/Discharge Planning: Goal: Ability to manage health-related needs will improve Outcome: Progressing   Problem: Clinical Measurements: Goal: Ability to maintain clinical measurements within normal limits will improve Outcome: Progressing Goal: Will remain free from infection Outcome: Progressing Goal: Diagnostic test results will improve Outcome: Progressing Goal: Respiratory complications will improve Outcome: Progressing Goal: Cardiovascular complication will be avoided Outcome: Progressing   Problem: Activity: Goal: Risk for activity intolerance will decrease Outcome: Progressing   Problem: Nutrition: Goal: Adequate nutrition will be maintained Outcome: Progressing   Problem: Coping: Goal: Level of anxiety will decrease Outcome: Progressing   Problem: Elimination: Goal: Will not experience complications related to bowel motility Outcome: Progressing Goal: Will not experience complications related to urinary retention Outcome: Progressing    Problem: Pain Managment: Goal: General experience of comfort will improve Outcome: Progressing   Problem: Safety: Goal: Ability to remain free from injury will improve Outcome: Progressing   Problem: Skin Integrity: Goal: Risk for impaired skin integrity will decrease Outcome: Progressing   

## 2023-02-07 DIAGNOSIS — G9341 Metabolic encephalopathy: Secondary | ICD-10-CM | POA: Diagnosis not present

## 2023-02-07 LAB — GLUCOSE, CAPILLARY
Glucose-Capillary: 163 mg/dL — ABNORMAL HIGH (ref 70–99)
Glucose-Capillary: 269 mg/dL — ABNORMAL HIGH (ref 70–99)

## 2023-02-07 LAB — VITAMIN B1: Vitamin B1 (Thiamine): 114.1 nmol/L (ref 66.5–200.0)

## 2023-02-07 MED ORDER — BASAGLAR KWIKPEN 100 UNIT/ML ~~LOC~~ SOPN: 10.0000 [IU] | PEN_INJECTOR | Freq: Two times a day (BID) | SUBCUTANEOUS | Status: DC

## 2023-02-07 MED ORDER — ASPIRIN 81 MG PO TBEC: 81.0000 mg | DELAYED_RELEASE_TABLET | Freq: Every day | ORAL | Status: DC

## 2023-02-07 MED ORDER — METOPROLOL TARTRATE 25 MG PO TABS: 25.0000 mg | ORAL_TABLET | Freq: Three times a day (TID) | ORAL | Status: DC

## 2023-02-07 MED ORDER — LACTULOSE 10 GM/15ML PO SOLN: 20.0000 g | Freq: Two times a day (BID) | ORAL | Status: DC

## 2023-02-07 NOTE — Progress Notes (Signed)
[facility] [phone] Liaison Note:  Notified by [facility] manager of patient/family request for [facility] Palliative services at home after discharge.   Referral made for Palliative services.   Please call with any hospice or outpatient palliative care related questions.   Thank you for the opportunity to participate in this patient's care.   Glenna Fellows, BSN, RN, OCN [employer] [phone]

## 2023-02-07 NOTE — Plan of Care (Signed)
Problem: Education: Goal: Ability to verbalize activity precautions or restrictions will improve 02/07/2023 1508 by Lance Bosch, RN Outcome: Adequate for Discharge 02/07/2023 1341 by Lance Bosch, RN Outcome: Progressing Goal: Knowledge of the prescribed therapeutic regimen will improve 02/07/2023 1508 by Lance Bosch, RN Outcome: Adequate for Discharge 02/07/2023 1341 by Lance Bosch, RN Outcome: Progressing Goal: Understanding of discharge needs will improve 02/07/2023 1508 by Lance Bosch, RN Outcome: Adequate for Discharge 02/07/2023 1341 by Lance Bosch, RN Outcome: Progressing   Problem: Activity: Goal: Ability to avoid complications of mobility impairment will improve 02/07/2023 1508 by Lance Bosch, RN Outcome: Adequate for Discharge 02/07/2023 1341 by Lance Bosch, RN Outcome: Progressing Goal: Ability to tolerate increased activity will improve 02/07/2023 1508 by Lance Bosch, RN Outcome: Adequate for Discharge 02/07/2023 1341 by Lance Bosch, RN Outcome: Progressing Goal: Will remain free from falls 02/07/2023 1508 by Lance Bosch, RN Outcome: Adequate for Discharge 02/07/2023 1341 by Lance Bosch, RN Outcome: Progressing   Problem: Bowel/Gastric: Goal: Gastrointestinal status for postoperative course will improve 02/07/2023 1508 by Lance Bosch, RN Outcome: Adequate for Discharge 02/07/2023 1341 by Lance Bosch, RN Outcome: Progressing   Problem: Clinical Measurements: Goal: Ability to maintain clinical measurements within normal limits will improve 02/07/2023 1508 by Lance Bosch, RN Outcome: Adequate for Discharge 02/07/2023 1341 by Lance Bosch, RN Outcome: Progressing Goal: Postoperative complications will be avoided or minimized 02/07/2023 1508 by Lance Bosch, RN Outcome: Adequate for Discharge 02/07/2023 1341 by Lance Bosch, RN Outcome: Progressing Goal: Diagnostic test  results will improve 02/07/2023 1508 by Lance Bosch, RN Outcome: Adequate for Discharge 02/07/2023 1341 by Lance Bosch, RN Outcome: Progressing   Problem: Pain Management: Goal: Pain level will decrease 02/07/2023 1508 by Lance Bosch, RN Outcome: Adequate for Discharge 02/07/2023 1341 by Lance Bosch, RN Outcome: Progressing   Problem: Skin Integrity: Goal: Will show signs of wound healing 02/07/2023 1508 by Lance Bosch, RN Outcome: Adequate for Discharge 02/07/2023 1341 by Lance Bosch, RN Outcome: Progressing   Problem: Health Behavior/Discharge Planning: Goal: Identification of resources available to assist in meeting health care needs will improve 02/07/2023 1508 by Lance Bosch, RN Outcome: Adequate for Discharge 02/07/2023 1341 by Lance Bosch, RN Outcome: Progressing   Problem: Bladder/Genitourinary: Goal: Urinary functional status for postoperative course will improve 02/07/2023 1508 by Lance Bosch, RN Outcome: Adequate for Discharge 02/07/2023 1341 by Lance Bosch, RN Outcome: Progressing   Problem: Education: Goal: Ability to describe self-care measures that may prevent or decrease complications (Diabetes Survival Skills Education) will improve 02/07/2023 1508 by Lance Bosch, RN Outcome: Adequate for Discharge 02/07/2023 1341 by Lance Bosch, RN Outcome: Progressing Goal: Individualized Educational Video(s) 02/07/2023 1508 by Lance Bosch, RN Outcome: Adequate for Discharge 02/07/2023 1341 by Lance Bosch, RN Outcome: Progressing   Problem: Coping: Goal: Ability to adjust to condition or change in health will improve 02/07/2023 1508 by Lance Bosch, RN Outcome: Adequate for Discharge 02/07/2023 1341 by Lance Bosch, RN Outcome: Progressing   Problem: Fluid Volume: Goal: Ability to maintain a balanced intake and output will improve 02/07/2023 1508 by Lance Bosch, RN Outcome:  Adequate for Discharge 02/07/2023 1341 by Lance Bosch, RN Outcome: Progressing   Problem: Health Behavior/Discharge Planning: Goal: Ability to identify and utilize available resources and services will improve 02/07/2023 1508 by Lance Bosch, RN Outcome: Adequate  for Discharge 02/07/2023 1341 by Lance Bosch, RN Outcome: Progressing Goal: Ability to manage health-related needs will improve 02/07/2023 1508 by Lance Bosch, RN Outcome: Adequate for Discharge 02/07/2023 1341 by Lance Bosch, RN Outcome: Progressing   Problem: Metabolic: Goal: Ability to maintain appropriate glucose levels will improve 02/07/2023 1508 by Lance Bosch, RN Outcome: Adequate for Discharge 02/07/2023 1341 by Lance Bosch, RN Outcome: Progressing   Problem: Nutritional: Goal: Maintenance of adequate nutrition will improve 02/07/2023 1508 by Lance Bosch, RN Outcome: Adequate for Discharge 02/07/2023 1341 by Lance Bosch, RN Outcome: Progressing Goal: Progress toward achieving an optimal weight will improve 02/07/2023 1508 by Lance Bosch, RN Outcome: Adequate for Discharge 02/07/2023 1341 by Lance Bosch, RN Outcome: Progressing   Problem: Skin Integrity: Goal: Risk for impaired skin integrity will decrease 02/07/2023 1508 by Lance Bosch, RN Outcome: Adequate for Discharge 02/07/2023 1341 by Lance Bosch, RN Outcome: Progressing   Problem: Tissue Perfusion: Goal: Adequacy of tissue perfusion will improve 02/07/2023 1508 by Lance Bosch, RN Outcome: Adequate for Discharge 02/07/2023 1341 by Lance Bosch, RN Outcome: Progressing   Problem: Education: Goal: Knowledge of General Education information will improve Description: Including pain rating scale, medication(s)/side effects and non-pharmacologic comfort measures 02/07/2023 1508 by Lance Bosch, RN Outcome: Adequate for Discharge 02/07/2023 1341 by Lance Bosch,  RN Outcome: Progressing   Problem: Health Behavior/Discharge Planning: Goal: Ability to manage health-related needs will improve 02/07/2023 1508 by Lance Bosch, RN Outcome: Adequate for Discharge 02/07/2023 1341 by Lance Bosch, RN Outcome: Progressing   Problem: Clinical Measurements: Goal: Ability to maintain clinical measurements within normal limits will improve 02/07/2023 1508 by Lance Bosch, RN Outcome: Adequate for Discharge 02/07/2023 1341 by Lance Bosch, RN Outcome: Progressing Goal: Will remain free from infection 02/07/2023 1508 by Lance Bosch, RN Outcome: Adequate for Discharge 02/07/2023 1341 by Lance Bosch, RN Outcome: Progressing Goal: Diagnostic test results will improve 02/07/2023 1508 by Lance Bosch, RN Outcome: Adequate for Discharge 02/07/2023 1341 by Lance Bosch, RN Outcome: Progressing Goal: Respiratory complications will improve 02/07/2023 1508 by Lance Bosch, RN Outcome: Adequate for Discharge 02/07/2023 1341 by Lance Bosch, RN Outcome: Progressing Goal: Cardiovascular complication will be avoided 02/07/2023 1508 by Lance Bosch, RN Outcome: Adequate for Discharge 02/07/2023 1341 by Lance Bosch, RN Outcome: Progressing   Problem: Activity: Goal: Risk for activity intolerance will decrease 02/07/2023 1508 by Lance Bosch, RN Outcome: Adequate for Discharge 02/07/2023 1341 by Lance Bosch, RN Outcome: Progressing   Problem: Nutrition: Goal: Adequate nutrition will be maintained 02/07/2023 1508 by Lance Bosch, RN Outcome: Adequate for Discharge 02/07/2023 1341 by Lance Bosch, RN Outcome: Progressing   Problem: Coping: Goal: Level of anxiety will decrease 02/07/2023 1508 by Lance Bosch, RN Outcome: Adequate for Discharge 02/07/2023 1341 by Lance Bosch, RN Outcome: Progressing   Problem: Elimination: Goal: Will not experience complications related to  bowel motility 02/07/2023 1508 by Lance Bosch, RN Outcome: Adequate for Discharge 02/07/2023 1341 by Lance Bosch, RN Outcome: Progressing Goal: Will not experience complications related to urinary retention 02/07/2023 1508 by Lance Bosch, RN Outcome: Adequate for Discharge 02/07/2023 1341 by Lance Bosch, RN Outcome: Progressing   Problem: Pain Managment: Goal: General experience of comfort will improve 02/07/2023 1508 by Lance Bosch, RN Outcome: Adequate for Discharge 02/07/2023 1341 by Lance Bosch, RN Outcome: Progressing  Problem: Safety: Goal: Ability to remain free from injury will improve 02/07/2023 1508 by Lance Bosch, RN Outcome: Adequate for Discharge 02/07/2023 1341 by Lance Bosch, RN Outcome: Progressing   Problem: Skin Integrity: Goal: Risk for impaired skin integrity will decrease 02/07/2023 1508 by Lance Bosch, RN Outcome: Adequate for Discharge 02/07/2023 1341 by Lance Bosch, RN Outcome: Progressing

## 2023-02-07 NOTE — Discharge Summary (Signed)
POP      Full           Yes      Yes                                 +---------+---------------+---------+-----------+----------+--------------+ PTV      Full                                                        +---------+---------------+---------+-----------+----------+--------------+ PERO     Full                                                        +---------+---------------+---------+-----------+----------+--------------+   +----+---------------+---------+-----------+----------+--------------+ LEFTCompressibilityPhasicitySpontaneityPropertiesThrombus Aging +----+---------------+---------+-----------+----------+--------------+ CFV Full           Yes      Yes                                 +----+---------------+---------+-----------+----------+--------------+    Summary: RIGHT: - There is no evidence of deep vein thrombosis in the lower extremity.  - No cystic structure found in the popliteal fossa.  LEFT: - No evidence of common femoral vein obstruction.   *See table(s) above for measurements and observations. Electronically signed by Sherald Hess MD on 01/30/2023 at 4:43:12 PM.    Final    ECHOCARDIOGRAM COMPLETE  Result Date: 01/29/2023    ECHOCARDIOGRAM REPORT   Patient Name:   [name] Date of Exam: 01/29/2023 Medical Rec #:  [id number]              Height:       62.0 in Accession #:    [id number]             Weight:        153.9 lb Date of Birth:  [birth year]              BSA:          1.710 m Patient Age:    77 years               BP:           126/87 mmHg Patient Gender: F                      HR:           106 bpm. Exam Location:  Inpatient Procedure: 2D Echo, Cardiac Doppler and Color Doppler Indications:    Congestive Heart Failure I50.9  History:        Patient has prior history of Echocardiogram examinations, most                 recent 04/25/2022. CHF, CAD and Previous Myocardial Infarction,                 Prior CABG, Cirrhosis, Arrythmias:Atrial Fibrillation,                 Signs/Symptoms:Murmur; Risk Factors:Hypertension, Diabetes and  Physician Discharge Summary   Patient: [name] MRN: [id number] DOB: [birth year]  Admit date:     01/28/2023  Discharge date: 02/07/23  Discharge Physician: Jacquelin Hawking, MD   PCP: Venita Sheffield, MD   Recommendations at discharge:  PCP visit for hospital follow-up Palliative care referral  Discharge Diagnoses: Principal Problem:   Acute metabolic encephalopathy Active Problems:   Acute on chronic HFrEF (heart failure with reduced ejection fraction) (HCC)   Atrial fibrillation with RVR (HCC)   Acute cystitis   COVID-19 virus infection   Hyperlipidemia   Essential hypertension   Liver cirrhosis secondary to NASH (HCC)   Esophageal varices without bleeding (HCC)   Thrombocytopenia (HCC)   Coronary artery disease of native artery of native heart with stable angina pectoris (HCC)   Type 2 diabetes mellitus without complication (HCC)   Pancytopenia (HCC)   Chronic respiratory failure with hypoxia (HCC)   Leg swelling  Resolved Problems:   * No resolved hospital problems. *  Hospital Course: [name] is a 77 y.o. female with a history of NASH cirrhosis, portal hypertensive gastropathy status post TIPS, thrombocytopenia, chronic respiratory failure, paroxysmal atrial fibrillation, diabetes mellitus type 2, diastolic heart failure, hypertension, CAD status post CABG, recurrent ESBL UTI.  Patient presented secondary to worsening confusion.  Patient found to be in atrial fibrillation with RVR in addition to having evidence of recurrent urinary tract infection.  Patient started on antibiotics for treatment and atrial fibrillation managed with metoprolol with control of high rates.  Hospitalization complicated by continued/recurrent altered mental status which has improved significantly with adjustment of lactulose for management of hepatic encephalopathy.  Assessment and Plan:  Acute metabolic encephalopathy Presumed multifactorial secondary to UTI and  possibly COVID 19 infection in addition to hepatic encephalopathy.  MRI obtained on 9/25 and was significant for evidence of possible PRES. ABG unremarkable for hypercapnia or acidemia. EEG without evidence of seizure activity. Neurology was consulted with recommendation for further lab workup; patient with normal vitamin B12, B1 and folate in addition to a non reactive RPR. Overall, mental status significantly improved prior to discharge with adjustment of lactulose for management of hepatic encephalopathy.   Atrial fibrillation with RVR Not on anticoagulation per cardiology recommendations with history of variceal bleeding in addition to subdural and subarachnoid hemorrhage history. Continue metoprolol.   Acute combined systolic and diastolic heart failure Echocardiogram this admission significant for an LVEF of 25 to 30%.  Cardiology is consulted.  Secondary to comorbidities, recommendation for no left heart cath per cardiology.  Ability to diuresis complicated by hypotension and AKI while inpatient. Patient started on metoprolol. Resume home diuretics.   Delirium Delirium precautions   Dementia Likely vascular after hearing family description of recent cognitive decline. Would benefit from outpatient neurology follow-up based on on-going goals of care.   Subacute infarct Single punctate focus incidentally on MRI brain located at the anterior left frontal lobe concerning for a subacute infarct. Neurology consulted with recommendation for stroke workup. CTA head/neck significant for no emergent LVO or high-grade stenosis of the intracranial arteries. Neurology recommendations for aspirin monotherapy.   AKI Baseline creatinine of about 0.7-0.8. Creatinine of 1.28 on admission with peak of 1.76. Improved. Back down to baseline.   UTI secondary to ESBL Klebsiella pneumoniae Patient completed a 5-day course of meropenem.   Hyperkalemia Mild. Resolved on recheck.   COVID-19 infection Patient  was treated with Paxlovid and completed course. Last day of isolation is 9/27.   Leg swelling LE venous  Physician Discharge Summary   Patient: [name] MRN: [id number] DOB: [birth year]  Admit date:     01/28/2023  Discharge date: 02/07/23  Discharge Physician: Jacquelin Hawking, MD   PCP: Venita Sheffield, MD   Recommendations at discharge:  PCP visit for hospital follow-up Palliative care referral  Discharge Diagnoses: Principal Problem:   Acute metabolic encephalopathy Active Problems:   Acute on chronic HFrEF (heart failure with reduced ejection fraction) (HCC)   Atrial fibrillation with RVR (HCC)   Acute cystitis   COVID-19 virus infection   Hyperlipidemia   Essential hypertension   Liver cirrhosis secondary to NASH (HCC)   Esophageal varices without bleeding (HCC)   Thrombocytopenia (HCC)   Coronary artery disease of native artery of native heart with stable angina pectoris (HCC)   Type 2 diabetes mellitus without complication (HCC)   Pancytopenia (HCC)   Chronic respiratory failure with hypoxia (HCC)   Leg swelling  Resolved Problems:   * No resolved hospital problems. *  Hospital Course: [name] is a 77 y.o. female with a history of NASH cirrhosis, portal hypertensive gastropathy status post TIPS, thrombocytopenia, chronic respiratory failure, paroxysmal atrial fibrillation, diabetes mellitus type 2, diastolic heart failure, hypertension, CAD status post CABG, recurrent ESBL UTI.  Patient presented secondary to worsening confusion.  Patient found to be in atrial fibrillation with RVR in addition to having evidence of recurrent urinary tract infection.  Patient started on antibiotics for treatment and atrial fibrillation managed with metoprolol with control of high rates.  Hospitalization complicated by continued/recurrent altered mental status which has improved significantly with adjustment of lactulose for management of hepatic encephalopathy.  Assessment and Plan:  Acute metabolic encephalopathy Presumed multifactorial secondary to UTI and  possibly COVID 19 infection in addition to hepatic encephalopathy.  MRI obtained on 9/25 and was significant for evidence of possible PRES. ABG unremarkable for hypercapnia or acidemia. EEG without evidence of seizure activity. Neurology was consulted with recommendation for further lab workup; patient with normal vitamin B12, B1 and folate in addition to a non reactive RPR. Overall, mental status significantly improved prior to discharge with adjustment of lactulose for management of hepatic encephalopathy.   Atrial fibrillation with RVR Not on anticoagulation per cardiology recommendations with history of variceal bleeding in addition to subdural and subarachnoid hemorrhage history. Continue metoprolol.   Acute combined systolic and diastolic heart failure Echocardiogram this admission significant for an LVEF of 25 to 30%.  Cardiology is consulted.  Secondary to comorbidities, recommendation for no left heart cath per cardiology.  Ability to diuresis complicated by hypotension and AKI while inpatient. Patient started on metoprolol. Resume home diuretics.   Delirium Delirium precautions   Dementia Likely vascular after hearing family description of recent cognitive decline. Would benefit from outpatient neurology follow-up based on on-going goals of care.   Subacute infarct Single punctate focus incidentally on MRI brain located at the anterior left frontal lobe concerning for a subacute infarct. Neurology consulted with recommendation for stroke workup. CTA head/neck significant for no emergent LVO or high-grade stenosis of the intracranial arteries. Neurology recommendations for aspirin monotherapy.   AKI Baseline creatinine of about 0.7-0.8. Creatinine of 1.28 on admission with peak of 1.76. Improved. Back down to baseline.   UTI secondary to ESBL Klebsiella pneumoniae Patient completed a 5-day course of meropenem.   Hyperkalemia Mild. Resolved on recheck.   COVID-19 infection Patient  was treated with Paxlovid and completed course. Last day of isolation is 9/27.   Leg swelling LE venous  POP      Full           Yes      Yes                                 +---------+---------------+---------+-----------+----------+--------------+ PTV      Full                                                        +---------+---------------+---------+-----------+----------+--------------+ PERO     Full                                                        +---------+---------------+---------+-----------+----------+--------------+   +----+---------------+---------+-----------+----------+--------------+ LEFTCompressibilityPhasicitySpontaneityPropertiesThrombus Aging +----+---------------+---------+-----------+----------+--------------+ CFV Full           Yes      Yes                                 +----+---------------+---------+-----------+----------+--------------+    Summary: RIGHT: - There is no evidence of deep vein thrombosis in the lower extremity.  - No cystic structure found in the popliteal fossa.  LEFT: - No evidence of common femoral vein obstruction.   *See table(s) above for measurements and observations. Electronically signed by Sherald Hess MD on 01/30/2023 at 4:43:12 PM.    Final    ECHOCARDIOGRAM COMPLETE  Result Date: 01/29/2023    ECHOCARDIOGRAM REPORT   Patient Name:   [name] Date of Exam: 01/29/2023 Medical Rec #:  [id number]              Height:       62.0 in Accession #:    [id number]             Weight:        153.9 lb Date of Birth:  [birth year]              BSA:          1.710 m Patient Age:    77 years               BP:           126/87 mmHg Patient Gender: F                      HR:           106 bpm. Exam Location:  Inpatient Procedure: 2D Echo, Cardiac Doppler and Color Doppler Indications:    Congestive Heart Failure I50.9  History:        Patient has prior history of Echocardiogram examinations, most                 recent 04/25/2022. CHF, CAD and Previous Myocardial Infarction,                 Prior CABG, Cirrhosis, Arrythmias:Atrial Fibrillation,                 Signs/Symptoms:Murmur; Risk Factors:Hypertension, Diabetes and  Physician Discharge Summary   Patient: [name] MRN: [id number] DOB: [birth year]  Admit date:     01/28/2023  Discharge date: 02/07/23  Discharge Physician: Jacquelin Hawking, MD   PCP: Venita Sheffield, MD   Recommendations at discharge:  PCP visit for hospital follow-up Palliative care referral  Discharge Diagnoses: Principal Problem:   Acute metabolic encephalopathy Active Problems:   Acute on chronic HFrEF (heart failure with reduced ejection fraction) (HCC)   Atrial fibrillation with RVR (HCC)   Acute cystitis   COVID-19 virus infection   Hyperlipidemia   Essential hypertension   Liver cirrhosis secondary to NASH (HCC)   Esophageal varices without bleeding (HCC)   Thrombocytopenia (HCC)   Coronary artery disease of native artery of native heart with stable angina pectoris (HCC)   Type 2 diabetes mellitus without complication (HCC)   Pancytopenia (HCC)   Chronic respiratory failure with hypoxia (HCC)   Leg swelling  Resolved Problems:   * No resolved hospital problems. *  Hospital Course: [name] is a 77 y.o. female with a history of NASH cirrhosis, portal hypertensive gastropathy status post TIPS, thrombocytopenia, chronic respiratory failure, paroxysmal atrial fibrillation, diabetes mellitus type 2, diastolic heart failure, hypertension, CAD status post CABG, recurrent ESBL UTI.  Patient presented secondary to worsening confusion.  Patient found to be in atrial fibrillation with RVR in addition to having evidence of recurrent urinary tract infection.  Patient started on antibiotics for treatment and atrial fibrillation managed with metoprolol with control of high rates.  Hospitalization complicated by continued/recurrent altered mental status which has improved significantly with adjustment of lactulose for management of hepatic encephalopathy.  Assessment and Plan:  Acute metabolic encephalopathy Presumed multifactorial secondary to UTI and  possibly COVID 19 infection in addition to hepatic encephalopathy.  MRI obtained on 9/25 and was significant for evidence of possible PRES. ABG unremarkable for hypercapnia or acidemia. EEG without evidence of seizure activity. Neurology was consulted with recommendation for further lab workup; patient with normal vitamin B12, B1 and folate in addition to a non reactive RPR. Overall, mental status significantly improved prior to discharge with adjustment of lactulose for management of hepatic encephalopathy.   Atrial fibrillation with RVR Not on anticoagulation per cardiology recommendations with history of variceal bleeding in addition to subdural and subarachnoid hemorrhage history. Continue metoprolol.   Acute combined systolic and diastolic heart failure Echocardiogram this admission significant for an LVEF of 25 to 30%.  Cardiology is consulted.  Secondary to comorbidities, recommendation for no left heart cath per cardiology.  Ability to diuresis complicated by hypotension and AKI while inpatient. Patient started on metoprolol. Resume home diuretics.   Delirium Delirium precautions   Dementia Likely vascular after hearing family description of recent cognitive decline. Would benefit from outpatient neurology follow-up based on on-going goals of care.   Subacute infarct Single punctate focus incidentally on MRI brain located at the anterior left frontal lobe concerning for a subacute infarct. Neurology consulted with recommendation for stroke workup. CTA head/neck significant for no emergent LVO or high-grade stenosis of the intracranial arteries. Neurology recommendations for aspirin monotherapy.   AKI Baseline creatinine of about 0.7-0.8. Creatinine of 1.28 on admission with peak of 1.76. Improved. Back down to baseline.   UTI secondary to ESBL Klebsiella pneumoniae Patient completed a 5-day course of meropenem.   Hyperkalemia Mild. Resolved on recheck.   COVID-19 infection Patient  was treated with Paxlovid and completed course. Last day of isolation is 9/27.   Leg swelling LE venous  Physician Discharge Summary   Patient: [name] MRN: [id number] DOB: [birth year]  Admit date:     01/28/2023  Discharge date: 02/07/23  Discharge Physician: Jacquelin Hawking, MD   PCP: Venita Sheffield, MD   Recommendations at discharge:  PCP visit for hospital follow-up Palliative care referral  Discharge Diagnoses: Principal Problem:   Acute metabolic encephalopathy Active Problems:   Acute on chronic HFrEF (heart failure with reduced ejection fraction) (HCC)   Atrial fibrillation with RVR (HCC)   Acute cystitis   COVID-19 virus infection   Hyperlipidemia   Essential hypertension   Liver cirrhosis secondary to NASH (HCC)   Esophageal varices without bleeding (HCC)   Thrombocytopenia (HCC)   Coronary artery disease of native artery of native heart with stable angina pectoris (HCC)   Type 2 diabetes mellitus without complication (HCC)   Pancytopenia (HCC)   Chronic respiratory failure with hypoxia (HCC)   Leg swelling  Resolved Problems:   * No resolved hospital problems. *  Hospital Course: [name] is a 77 y.o. female with a history of NASH cirrhosis, portal hypertensive gastropathy status post TIPS, thrombocytopenia, chronic respiratory failure, paroxysmal atrial fibrillation, diabetes mellitus type 2, diastolic heart failure, hypertension, CAD status post CABG, recurrent ESBL UTI.  Patient presented secondary to worsening confusion.  Patient found to be in atrial fibrillation with RVR in addition to having evidence of recurrent urinary tract infection.  Patient started on antibiotics for treatment and atrial fibrillation managed with metoprolol with control of high rates.  Hospitalization complicated by continued/recurrent altered mental status which has improved significantly with adjustment of lactulose for management of hepatic encephalopathy.  Assessment and Plan:  Acute metabolic encephalopathy Presumed multifactorial secondary to UTI and  possibly COVID 19 infection in addition to hepatic encephalopathy.  MRI obtained on 9/25 and was significant for evidence of possible PRES. ABG unremarkable for hypercapnia or acidemia. EEG without evidence of seizure activity. Neurology was consulted with recommendation for further lab workup; patient with normal vitamin B12, B1 and folate in addition to a non reactive RPR. Overall, mental status significantly improved prior to discharge with adjustment of lactulose for management of hepatic encephalopathy.   Atrial fibrillation with RVR Not on anticoagulation per cardiology recommendations with history of variceal bleeding in addition to subdural and subarachnoid hemorrhage history. Continue metoprolol.   Acute combined systolic and diastolic heart failure Echocardiogram this admission significant for an LVEF of 25 to 30%.  Cardiology is consulted.  Secondary to comorbidities, recommendation for no left heart cath per cardiology.  Ability to diuresis complicated by hypotension and AKI while inpatient. Patient started on metoprolol. Resume home diuretics.   Delirium Delirium precautions   Dementia Likely vascular after hearing family description of recent cognitive decline. Would benefit from outpatient neurology follow-up based on on-going goals of care.   Subacute infarct Single punctate focus incidentally on MRI brain located at the anterior left frontal lobe concerning for a subacute infarct. Neurology consulted with recommendation for stroke workup. CTA head/neck significant for no emergent LVO or high-grade stenosis of the intracranial arteries. Neurology recommendations for aspirin monotherapy.   AKI Baseline creatinine of about 0.7-0.8. Creatinine of 1.28 on admission with peak of 1.76. Improved. Back down to baseline.   UTI secondary to ESBL Klebsiella pneumoniae Patient completed a 5-day course of meropenem.   Hyperkalemia Mild. Resolved on recheck.   COVID-19 infection Patient  was treated with Paxlovid and completed course. Last day of isolation is 9/27.   Leg swelling LE venous  artifactual, a possible small subacute ischemic infarct is difficult to exclude. 3. Underlying age-related cerebral atrophy with chronic small vessel ischemic disease. Small chronic cortical to subcortical infarct at  the high left frontal lobe, new as compared to previous MRI. Electronically Signed   By: Rise Mu M.D.   On: 02/02/2023 05:31   VAS [place] LOWER EXTREMITY VENOUS (DVT)  Result Date: 01/30/2023  Lower Venous DVT Study Patient Name:  [facility] GLASBY [facility]  Date of Exam:   01/30/2023 Medical Rec #: [id number]               Accession #:    [id number] Date of Birth: 05-Dec-1945               Patient Gender: F Patient Age:   87 years Exam Location:  [facility] Procedure:      VAS [place] LOWER EXTREMITY VENOUS (DVT) Referring Phys: Joen Laura --------------------------------------------------------------------------------  Indications: Edema, and Swelling.  Risk Factors: Immobility. Limitations: Limited range of motion. Comparison Study: No significant changes seen since previous exam 07/01/22 Performing Technologist: Shona Simpson  Examination Guidelines: A complete evaluation includes B-mode imaging, spectral Doppler, color Doppler, and power Doppler as needed of all accessible portions of each vessel. Bilateral testing is considered an integral part of a complete examination. Limited examinations for reoccurring indications may be performed as noted. The reflux portion of the exam is performed with the patient in reverse Trendelenburg.  +---------+---------------+---------+-----------+----------+--------------+ RIGHT    CompressibilityPhasicitySpontaneityPropertiesThrombus Aging +---------+---------------+---------+-----------+----------+--------------+ CFV      Full           Yes      Yes                                 +---------+---------------+---------+-----------+----------+--------------+ SFJ      Full                                                        +---------+---------------+---------+-----------+----------+--------------+ FV Prox  Full                                                         +---------+---------------+---------+-----------+----------+--------------+ FV Mid   Full                                                        +---------+---------------+---------+-----------+----------+--------------+ FV DistalFull                                                        +---------+---------------+---------+-----------+----------+--------------+ PFV      Full                                                        +---------+---------------+---------+-----------+----------+--------------+  artifactual, a possible small subacute ischemic infarct is difficult to exclude. 3. Underlying age-related cerebral atrophy with chronic small vessel ischemic disease. Small chronic cortical to subcortical infarct at  the high left frontal lobe, new as compared to previous MRI. Electronically Signed   By: Rise Mu M.D.   On: 02/02/2023 05:31   VAS [place] LOWER EXTREMITY VENOUS (DVT)  Result Date: 01/30/2023  Lower Venous DVT Study Patient Name:  [facility] GLASBY [facility]  Date of Exam:   01/30/2023 Medical Rec #: [id number]               Accession #:    [id number] Date of Birth: 05-Dec-1945               Patient Gender: F Patient Age:   87 years Exam Location:  [facility] Procedure:      VAS [place] LOWER EXTREMITY VENOUS (DVT) Referring Phys: Joen Laura --------------------------------------------------------------------------------  Indications: Edema, and Swelling.  Risk Factors: Immobility. Limitations: Limited range of motion. Comparison Study: No significant changes seen since previous exam 07/01/22 Performing Technologist: Shona Simpson  Examination Guidelines: A complete evaluation includes B-mode imaging, spectral Doppler, color Doppler, and power Doppler as needed of all accessible portions of each vessel. Bilateral testing is considered an integral part of a complete examination. Limited examinations for reoccurring indications may be performed as noted. The reflux portion of the exam is performed with the patient in reverse Trendelenburg.  +---------+---------------+---------+-----------+----------+--------------+ RIGHT    CompressibilityPhasicitySpontaneityPropertiesThrombus Aging +---------+---------------+---------+-----------+----------+--------------+ CFV      Full           Yes      Yes                                 +---------+---------------+---------+-----------+----------+--------------+ SFJ      Full                                                        +---------+---------------+---------+-----------+----------+--------------+ FV Prox  Full                                                         +---------+---------------+---------+-----------+----------+--------------+ FV Mid   Full                                                        +---------+---------------+---------+-----------+----------+--------------+ FV DistalFull                                                        +---------+---------------+---------+-----------+----------+--------------+ PFV      Full                                                        +---------+---------------+---------+-----------+----------+--------------+  Physician Discharge Summary   Patient: [name] MRN: [id number] DOB: [birth year]  Admit date:     01/28/2023  Discharge date: 02/07/23  Discharge Physician: Jacquelin Hawking, MD   PCP: Venita Sheffield, MD   Recommendations at discharge:  PCP visit for hospital follow-up Palliative care referral  Discharge Diagnoses: Principal Problem:   Acute metabolic encephalopathy Active Problems:   Acute on chronic HFrEF (heart failure with reduced ejection fraction) (HCC)   Atrial fibrillation with RVR (HCC)   Acute cystitis   COVID-19 virus infection   Hyperlipidemia   Essential hypertension   Liver cirrhosis secondary to NASH (HCC)   Esophageal varices without bleeding (HCC)   Thrombocytopenia (HCC)   Coronary artery disease of native artery of native heart with stable angina pectoris (HCC)   Type 2 diabetes mellitus without complication (HCC)   Pancytopenia (HCC)   Chronic respiratory failure with hypoxia (HCC)   Leg swelling  Resolved Problems:   * No resolved hospital problems. *  Hospital Course: [name] is a 77 y.o. female with a history of NASH cirrhosis, portal hypertensive gastropathy status post TIPS, thrombocytopenia, chronic respiratory failure, paroxysmal atrial fibrillation, diabetes mellitus type 2, diastolic heart failure, hypertension, CAD status post CABG, recurrent ESBL UTI.  Patient presented secondary to worsening confusion.  Patient found to be in atrial fibrillation with RVR in addition to having evidence of recurrent urinary tract infection.  Patient started on antibiotics for treatment and atrial fibrillation managed with metoprolol with control of high rates.  Hospitalization complicated by continued/recurrent altered mental status which has improved significantly with adjustment of lactulose for management of hepatic encephalopathy.  Assessment and Plan:  Acute metabolic encephalopathy Presumed multifactorial secondary to UTI and  possibly COVID 19 infection in addition to hepatic encephalopathy.  MRI obtained on 9/25 and was significant for evidence of possible PRES. ABG unremarkable for hypercapnia or acidemia. EEG without evidence of seizure activity. Neurology was consulted with recommendation for further lab workup; patient with normal vitamin B12, B1 and folate in addition to a non reactive RPR. Overall, mental status significantly improved prior to discharge with adjustment of lactulose for management of hepatic encephalopathy.   Atrial fibrillation with RVR Not on anticoagulation per cardiology recommendations with history of variceal bleeding in addition to subdural and subarachnoid hemorrhage history. Continue metoprolol.   Acute combined systolic and diastolic heart failure Echocardiogram this admission significant for an LVEF of 25 to 30%.  Cardiology is consulted.  Secondary to comorbidities, recommendation for no left heart cath per cardiology.  Ability to diuresis complicated by hypotension and AKI while inpatient. Patient started on metoprolol. Resume home diuretics.   Delirium Delirium precautions   Dementia Likely vascular after hearing family description of recent cognitive decline. Would benefit from outpatient neurology follow-up based on on-going goals of care.   Subacute infarct Single punctate focus incidentally on MRI brain located at the anterior left frontal lobe concerning for a subacute infarct. Neurology consulted with recommendation for stroke workup. CTA head/neck significant for no emergent LVO or high-grade stenosis of the intracranial arteries. Neurology recommendations for aspirin monotherapy.   AKI Baseline creatinine of about 0.7-0.8. Creatinine of 1.28 on admission with peak of 1.76. Improved. Back down to baseline.   UTI secondary to ESBL Klebsiella pneumoniae Patient completed a 5-day course of meropenem.   Hyperkalemia Mild. Resolved on recheck.   COVID-19 infection Patient  was treated with Paxlovid and completed course. Last day of isolation is 9/27.   Leg swelling LE venous

## 2023-02-07 NOTE — Plan of Care (Signed)
  Problem: Nutritional: Goal: Maintenance of adequate nutrition will improve Outcome: Progressing   Problem: Skin Integrity: Goal: Risk for impaired skin integrity will decrease Outcome: Progressing   Problem: Nutrition: Goal: Adequate nutrition will be maintained Outcome: Progressing   Problem: Coping: Goal: Level of anxiety will decrease Outcome: Progressing   Problem: Elimination: Goal: Will not experience complications related to bowel motility Outcome: Progressing Goal: Will not experience complications related to urinary retention Outcome: Progressing   Problem: Pain Managment: Goal: General experience of comfort will improve Outcome: Progressing   Problem: Safety: Goal: Ability to remain free from injury will improve Outcome: Progressing   Problem: Skin Integrity: Goal: Risk for impaired skin integrity will decrease Outcome: Progressing

## 2023-02-07 NOTE — Plan of Care (Signed)
  Problem: Education: Goal: Ability to verbalize activity precautions or restrictions will improve Outcome: Progressing Goal: Knowledge of the prescribed therapeutic regimen will improve Outcome: Progressing Goal: Understanding of discharge needs will improve Outcome: Progressing   Problem: Activity: Goal: Ability to avoid complications of mobility impairment will improve Outcome: Progressing Goal: Ability to tolerate increased activity will improve Outcome: Progressing Goal: Will remain free from falls Outcome: Progressing   Problem: Bowel/Gastric: Goal: Gastrointestinal status for postoperative course will improve Outcome: Progressing   Problem: Clinical Measurements: Goal: Ability to maintain clinical measurements within normal limits will improve Outcome: Progressing Goal: Postoperative complications will be avoided or minimized Outcome: Progressing Goal: Diagnostic test results will improve Outcome: Progressing   Problem: Pain Management: Goal: Pain level will decrease Outcome: Progressing   Problem: Skin Integrity: Goal: Will show signs of wound healing Outcome: Progressing   Problem: Health Behavior/Discharge Planning: Goal: Identification of resources available to assist in meeting health care needs will improve Outcome: Progressing   Problem: Bladder/Genitourinary: Goal: Urinary functional status for postoperative course will improve Outcome: Progressing   Problem: Education: Goal: Ability to describe self-care measures that may prevent or decrease complications (Diabetes Survival Skills Education) will improve Outcome: Progressing Goal: Individualized Educational Video(s) Outcome: Progressing   Problem: Coping: Goal: Ability to adjust to condition or change in health will improve Outcome: Progressing   Problem: Fluid Volume: Goal: Ability to maintain a balanced intake and output will improve Outcome: Progressing   Problem: Health Behavior/Discharge  Planning: Goal: Ability to identify and utilize available resources and services will improve Outcome: Progressing Goal: Ability to manage health-related needs will improve Outcome: Progressing   Problem: Metabolic: Goal: Ability to maintain appropriate glucose levels will improve Outcome: Progressing   Problem: Nutritional: Goal: Maintenance of adequate nutrition will improve Outcome: Progressing Goal: Progress toward achieving an optimal weight will improve Outcome: Progressing   Problem: Skin Integrity: Goal: Risk for impaired skin integrity will decrease Outcome: Progressing   Problem: Tissue Perfusion: Goal: Adequacy of tissue perfusion will improve Outcome: Progressing   Problem: Education: Goal: Knowledge of General Education information will improve Description: Including pain rating scale, medication(s)/side effects and non-pharmacologic comfort measures Outcome: Progressing   Problem: Health Behavior/Discharge Planning: Goal: Ability to manage health-related needs will improve Outcome: Progressing   Problem: Clinical Measurements: Goal: Ability to maintain clinical measurements within normal limits will improve Outcome: Progressing Goal: Will remain free from infection Outcome: Progressing Goal: Diagnostic test results will improve Outcome: Progressing Goal: Respiratory complications will improve Outcome: Progressing Goal: Cardiovascular complication will be avoided Outcome: Progressing   Problem: Activity: Goal: Risk for activity intolerance will decrease Outcome: Progressing   Problem: Nutrition: Goal: Adequate nutrition will be maintained Outcome: Progressing   Problem: Coping: Goal: Level of anxiety will decrease Outcome: Progressing   Problem: Elimination: Goal: Will not experience complications related to bowel motility Outcome: Progressing Goal: Will not experience complications related to urinary retention Outcome: Progressing    Problem: Pain Managment: Goal: General experience of comfort will improve Outcome: Progressing   Problem: Safety: Goal: Ability to remain free from injury will improve Outcome: Progressing   Problem: Skin Integrity: Goal: Risk for impaired skin integrity will decrease Outcome: Progressing   

## 2023-02-07 NOTE — Progress Notes (Signed)
Heart Failure Navigator Progress Note  Assessed for Heart & Vascular TOC clinic readiness.  Patient does not meet criteria due to per MD note patient with dementia,.   Navigator will sign off at this time.   Rhae Hammock, BSN, Scientist, clinical (histocompatibility and immunogenetics) Only

## 2023-02-07 NOTE — Discharge Instructions (Addendum)
[name],  You are in the hospital with confusion.  This was initially thought to be related to ear infections.  Ear infections are treated successfully by his had persistent confusion.  Thankfully, you appear to have improved with treatment of your hepatic encephalopathy with lactulose and rifaximin.  Please continue your new doses as prescribed.  He also had a fast heart rate that was controlled with blood pressure medications.  Please continue metoprolol on discharge. While you were here, you were also found to have had a small stroke and heart failure. Regarding your stroke, you have been started on Aspirin. Regarding your heart failure, the heart doctor has you on the metoprolol for that as well; after discussion, recommendation for no invasive workup for your heart failure. Please continue your diuretics.

## 2023-02-07 NOTE — TOC Transition Note (Signed)
Transition of Care [facility]) - CM/SW Discharge Note   Patient Details  Name: [name] MRN: [id number] Date of Birth: Feb 26, 1946  [facility]) CM/SW Contact:  Deatra Robinson, [place] Phone Number: 02/07/2023, 1:29 PM   Clinical Narrative:  pt for dc back to [facility] where she is a LTC resident. Spoke to [place] in admissions who confirmed pt able to return to room 708A. [facility] following for palliative care services at [facility]. Pt's dtr Minnie notified of dc and is agreeable to dc plan. RN provided with number for report and [facility] arranged for transport. SW signing off at dc.   Dellie Burns, MSW, LCSW [phone] (coverage)       Final next level of care: Skilled Nursing Facility Barriers to Discharge: No Barriers Identified   Patient Goals and CMS Choice CMS Medicare.gov Compare Post Acute Care list provided to::  (return to previous SNF) Choice offered to / list presented to :  (return to previous SNF)  Discharge Placement                Patient chooses bed at: [facility], [facility] [facility] Patient to be transferred to facility by: [facility] Name of family member notified: Minnie/dtr Patient and family notified of of transfer: 02/07/23  Discharge Plan and Services Additional resources added to the After Visit Summary for       Post Acute Care Choice: Skilled Nursing Facility                               Social Determinants of Health (SDOH) Interventions SDOH Screenings   Food Insecurity: Patient Unable To Answer (01/28/2023)  Housing: High Risk (01/28/2023)  Transportation Needs: Patient Unable To Answer (01/28/2023)  Utilities: Patient Unable To Answer (01/28/2023)  Alcohol Screen: Low Risk  (06/04/2018)  Depression (PHQ2-9): Low Risk  (08/15/2022)  Financial Resource Strain: Low Risk  (06/05/2022)   Received from [facility], [facility]  Physical Activity: Inactive (05/29/2017)  Social Connections: Unknown  (06/02/2022)   Received from [facility], [facility]  Stress: No Stress Concern Present (06/05/2022)   Received from [facility], [facility]  Tobacco Use: Low Risk  (01/28/2023)     Readmission Risk Interventions    10/18/2022   10:27 AM 07/03/2022   12:13 PM  Readmission Risk Prevention Plan  Transportation Screening Complete Complete  Medication Review (RN Care Manager) Complete Not Complete  PCP or Specialist appointment within 3-5 days of discharge Complete Complete  HRI or Home Care Consult Complete   SW Recovery Care/Counseling Consult Complete Complete  Palliative Care Screening Not Applicable Not Complete  Comments  patient will go to skilled nursing  Skilled Nursing Facility Complete Complete

## 2023-02-07 NOTE — TOC Progression Note (Signed)
Report given to Marylu Lund at [facility].

## 2023-02-08 DIAGNOSIS — Z79899 Other long term (current) drug therapy: Secondary | ICD-10-CM | POA: Diagnosis not present

## 2023-02-12 DIAGNOSIS — G8929 Other chronic pain: Secondary | ICD-10-CM | POA: Diagnosis not present

## 2023-02-12 DIAGNOSIS — A419 Sepsis, unspecified organism: Secondary | ICD-10-CM | POA: Diagnosis not present

## 2023-02-12 DIAGNOSIS — M549 Dorsalgia, unspecified: Secondary | ICD-10-CM | POA: Diagnosis not present

## 2023-02-12 DIAGNOSIS — G9341 Metabolic encephalopathy: Secondary | ICD-10-CM | POA: Diagnosis not present

## 2023-02-12 DIAGNOSIS — E1151 Type 2 diabetes mellitus with diabetic peripheral angiopathy without gangrene: Secondary | ICD-10-CM | POA: Diagnosis not present

## 2023-02-12 DIAGNOSIS — I4891 Unspecified atrial fibrillation: Secondary | ICD-10-CM | POA: Diagnosis not present

## 2023-02-12 DIAGNOSIS — K7682 Hepatic encephalopathy: Secondary | ICD-10-CM | POA: Diagnosis not present

## 2023-02-12 DIAGNOSIS — I5041 Acute combined systolic (congestive) and diastolic (congestive) heart failure: Secondary | ICD-10-CM | POA: Diagnosis not present

## 2023-02-12 DIAGNOSIS — N390 Urinary tract infection, site not specified: Secondary | ICD-10-CM | POA: Diagnosis not present

## 2023-02-15 DIAGNOSIS — L89153 Pressure ulcer of sacral region, stage 3: Secondary | ICD-10-CM | POA: Diagnosis not present

## 2023-02-17 DIAGNOSIS — F3289 Other specified depressive episodes: Secondary | ICD-10-CM | POA: Diagnosis not present

## 2023-02-17 DIAGNOSIS — R410 Disorientation, unspecified: Secondary | ICD-10-CM | POA: Diagnosis not present

## 2023-02-17 DIAGNOSIS — K7581 Nonalcoholic steatohepatitis (NASH): Secondary | ICD-10-CM | POA: Diagnosis not present

## 2023-02-17 DIAGNOSIS — L89153 Pressure ulcer of sacral region, stage 3: Secondary | ICD-10-CM | POA: Diagnosis not present

## 2023-02-17 DIAGNOSIS — K7460 Unspecified cirrhosis of liver: Secondary | ICD-10-CM | POA: Diagnosis not present

## 2023-02-17 DIAGNOSIS — R54 Age-related physical debility: Secondary | ICD-10-CM | POA: Diagnosis not present

## 2023-02-17 DIAGNOSIS — R2681 Unsteadiness on feet: Secondary | ICD-10-CM | POA: Diagnosis not present

## 2023-02-17 DIAGNOSIS — Z7189 Other specified counseling: Secondary | ICD-10-CM | POA: Diagnosis not present

## 2023-02-17 DIAGNOSIS — M5030 Other cervical disc degeneration, unspecified cervical region: Secondary | ICD-10-CM | POA: Diagnosis not present

## 2023-02-17 DIAGNOSIS — M6281 Muscle weakness (generalized): Secondary | ICD-10-CM | POA: Diagnosis not present

## 2023-02-17 DIAGNOSIS — I8500 Esophageal varices without bleeding: Secondary | ICD-10-CM | POA: Diagnosis not present

## 2023-02-17 DIAGNOSIS — I5032 Chronic diastolic (congestive) heart failure: Secondary | ICD-10-CM | POA: Diagnosis not present

## 2023-02-17 DIAGNOSIS — G9341 Metabolic encephalopathy: Secondary | ICD-10-CM | POA: Diagnosis not present

## 2023-02-17 DIAGNOSIS — Z515 Encounter for palliative care: Secondary | ICD-10-CM | POA: Diagnosis not present

## 2023-02-20 DIAGNOSIS — R2681 Unsteadiness on feet: Secondary | ICD-10-CM | POA: Diagnosis not present

## 2023-02-20 DIAGNOSIS — I10 Essential (primary) hypertension: Secondary | ICD-10-CM | POA: Diagnosis not present

## 2023-02-20 DIAGNOSIS — I5020 Unspecified systolic (congestive) heart failure: Secondary | ICD-10-CM | POA: Diagnosis not present

## 2023-02-20 DIAGNOSIS — G9341 Metabolic encephalopathy: Secondary | ICD-10-CM | POA: Diagnosis not present

## 2023-02-20 DIAGNOSIS — M6281 Muscle weakness (generalized): Secondary | ICD-10-CM | POA: Diagnosis not present

## 2023-02-20 DIAGNOSIS — E119 Type 2 diabetes mellitus without complications: Secondary | ICD-10-CM | POA: Diagnosis not present

## 2023-02-22 DIAGNOSIS — L89153 Pressure ulcer of sacral region, stage 3: Secondary | ICD-10-CM | POA: Diagnosis not present

## 2023-02-23 DIAGNOSIS — Z79899 Other long term (current) drug therapy: Secondary | ICD-10-CM | POA: Diagnosis not present

## 2023-02-23 DIAGNOSIS — K7581 Nonalcoholic steatohepatitis (NASH): Secondary | ICD-10-CM | POA: Diagnosis not present

## 2023-02-23 DIAGNOSIS — R54 Age-related physical debility: Secondary | ICD-10-CM | POA: Diagnosis not present

## 2023-02-23 DIAGNOSIS — L89153 Pressure ulcer of sacral region, stage 3: Secondary | ICD-10-CM | POA: Diagnosis not present

## 2023-02-23 DIAGNOSIS — M5030 Other cervical disc degeneration, unspecified cervical region: Secondary | ICD-10-CM | POA: Diagnosis not present

## 2023-02-23 DIAGNOSIS — I8500 Esophageal varices without bleeding: Secondary | ICD-10-CM | POA: Diagnosis not present

## 2023-02-23 DIAGNOSIS — I10 Essential (primary) hypertension: Secondary | ICD-10-CM | POA: Diagnosis not present

## 2023-02-23 DIAGNOSIS — K7460 Unspecified cirrhosis of liver: Secondary | ICD-10-CM | POA: Diagnosis not present

## 2023-02-23 DIAGNOSIS — F3289 Other specified depressive episodes: Secondary | ICD-10-CM | POA: Diagnosis not present

## 2023-02-23 DIAGNOSIS — Z515 Encounter for palliative care: Secondary | ICD-10-CM | POA: Diagnosis not present

## 2023-02-23 DIAGNOSIS — G9341 Metabolic encephalopathy: Secondary | ICD-10-CM | POA: Diagnosis not present

## 2023-02-23 DIAGNOSIS — R410 Disorientation, unspecified: Secondary | ICD-10-CM | POA: Diagnosis not present

## 2023-02-23 DIAGNOSIS — I5032 Chronic diastolic (congestive) heart failure: Secondary | ICD-10-CM | POA: Diagnosis not present

## 2023-02-23 DIAGNOSIS — Z7189 Other specified counseling: Secondary | ICD-10-CM | POA: Diagnosis not present

## 2023-02-24 DIAGNOSIS — N390 Urinary tract infection, site not specified: Secondary | ICD-10-CM | POA: Diagnosis not present

## 2023-02-24 DIAGNOSIS — G9341 Metabolic encephalopathy: Secondary | ICD-10-CM | POA: Diagnosis not present

## 2023-02-24 DIAGNOSIS — M6281 Muscle weakness (generalized): Secondary | ICD-10-CM | POA: Diagnosis not present

## 2023-02-24 DIAGNOSIS — R2681 Unsteadiness on feet: Secondary | ICD-10-CM | POA: Diagnosis not present

## 2023-02-26 ENCOUNTER — Emergency Department (HOSPITAL_COMMUNITY): Payer: Medicare Other

## 2023-02-26 ENCOUNTER — Other Ambulatory Visit: Payer: Self-pay

## 2023-02-26 ENCOUNTER — Emergency Department (HOSPITAL_COMMUNITY)
Admission: EM | Admit: 2023-02-26 | Discharge: 2023-02-26 | Disposition: A | Discharge status: Home / Self Care | Payer: Medicare Other | Attending: Emergency Medicine | Admitting: Emergency Medicine

## 2023-02-26 DIAGNOSIS — S0181XA Laceration without foreign body of other part of head, initial encounter: Secondary | ICD-10-CM | POA: Diagnosis not present

## 2023-02-26 DIAGNOSIS — W19XXXA Unspecified fall, initial encounter: Secondary | ICD-10-CM | POA: Diagnosis not present

## 2023-02-26 DIAGNOSIS — Z043 Encounter for examination and observation following other accident: Secondary | ICD-10-CM | POA: Diagnosis not present

## 2023-02-26 DIAGNOSIS — J90 Pleural effusion, not elsewhere classified: Secondary | ICD-10-CM | POA: Diagnosis not present

## 2023-02-26 DIAGNOSIS — Z91040 Latex allergy status: Secondary | ICD-10-CM | POA: Diagnosis not present

## 2023-02-26 DIAGNOSIS — W010XXA Fall on same level from slipping, tripping and stumbling without subsequent striking against object, initial encounter: Secondary | ICD-10-CM | POA: Diagnosis not present

## 2023-02-26 DIAGNOSIS — R531 Weakness: Secondary | ICD-10-CM | POA: Diagnosis not present

## 2023-02-26 DIAGNOSIS — Y92129 Unspecified place in nursing home as the place of occurrence of the external cause: Secondary | ICD-10-CM | POA: Diagnosis not present

## 2023-02-26 DIAGNOSIS — S199XXA Unspecified injury of neck, initial encounter: Secondary | ICD-10-CM | POA: Diagnosis not present

## 2023-02-26 DIAGNOSIS — R0989 Other specified symptoms and signs involving the circulatory and respiratory systems: Secondary | ICD-10-CM | POA: Diagnosis not present

## 2023-02-26 DIAGNOSIS — Z7401 Bed confinement status: Secondary | ICD-10-CM | POA: Diagnosis not present

## 2023-02-26 DIAGNOSIS — Z7982 Long term (current) use of aspirin: Secondary | ICD-10-CM | POA: Insufficient documentation

## 2023-02-26 DIAGNOSIS — R739 Hyperglycemia, unspecified: Secondary | ICD-10-CM | POA: Diagnosis not present

## 2023-02-26 DIAGNOSIS — M25559 Pain in unspecified hip: Secondary | ICD-10-CM | POA: Diagnosis not present

## 2023-02-26 DIAGNOSIS — S0990XA Unspecified injury of head, initial encounter: Secondary | ICD-10-CM | POA: Diagnosis not present

## 2023-02-26 DIAGNOSIS — R918 Other nonspecific abnormal finding of lung field: Secondary | ICD-10-CM | POA: Diagnosis not present

## 2023-02-26 LAB — COMPREHENSIVE METABOLIC PANEL
ALT: 22 U/L (ref 0–44)
AST: 36 U/L (ref 15–41)
Albumin: 2.4 g/dL — ABNORMAL LOW (ref 3.5–5.0)
Alkaline Phosphatase: 119 U/L (ref 38–126)
Anion gap: 7 (ref 5–15)
BUN: 20 mg/dL (ref 8–23)
CO2: 28 mmol/L (ref 22–32)
Calcium: 8.7 mg/dL — ABNORMAL LOW (ref 8.9–10.3)
Chloride: 108 mmol/L (ref 98–111)
Creatinine, Ser: 0.95 mg/dL (ref 0.44–1.00)
GFR, Estimated: >60 mL/min (ref >60)
Glucose, Bld: 313 mg/dL — ABNORMAL HIGH (ref 70–99)
Potassium: 2.9 mmol/L — ABNORMAL LOW (ref 3.5–5.1)
Sodium: 143 mmol/L (ref 135–145)
Total Bilirubin: 2.5 mg/dL — ABNORMAL HIGH (ref 0.3–1.2)
Total Protein: 6.2 g/dL — ABNORMAL LOW (ref 6.5–8.1)

## 2023-02-26 LAB — CBC WITH DIFFERENTIAL/PLATELET
Abs Immature Granulocytes: 0.03 10*3/uL (ref 0.00–0.07)
Basophils Absolute: 0.1 10*3/uL (ref 0.0–0.1)
Basophils Relative: 1 %
Eosinophils Absolute: 0.1 10*3/uL (ref 0.0–0.5)
Eosinophils Relative: 2 %
HCT: 38.2 % (ref 36.0–46.0)
Hemoglobin: 11.8 g/dL — ABNORMAL LOW (ref 12.0–15.0)
Immature Granulocytes: 1 %
Lymphocytes Relative: 21 %
Lymphs Abs: 1.2 10*3/uL (ref 0.7–4.0)
MCH: 30.3 pg (ref 26.0–34.0)
MCHC: 30.9 g/dL (ref 30.0–36.0)
MCV: 97.9 fL (ref 80.0–100.0)
Monocytes Absolute: 0.8 10*3/uL (ref 0.1–1.0)
Monocytes Relative: 16 %
Neutro Abs: 3.2 10*3/uL (ref 1.7–7.7)
Neutrophils Relative %: 59 %
Platelets: 124 10*3/uL — ABNORMAL LOW (ref 150–400)
RBC: 3.9 MIL/uL (ref 3.87–5.11)
RDW: 18.1 % — ABNORMAL HIGH (ref 11.5–15.5)
WBC: 5.4 10*3/uL (ref 4.0–10.5)
nRBC: 0 % (ref 0.0–0.2)

## 2023-02-26 MED ORDER — LIDOCAINE-EPINEPHRINE (PF) 2 %-1:200000 IJ SOLN
10.0000 mL | Freq: Once | INTRAMUSCULAR | Status: AC
  Administered 2023-02-26: 10 mL
  Filled 2023-02-26: qty 20

## 2023-02-26 MED ORDER — POTASSIUM CHLORIDE CRYS ER 20 MEQ PO TBCR
40.0000 meq | EXTENDED_RELEASE_TABLET | Freq: Once | ORAL | Status: AC
  Administered 2023-02-26: 40 meq via ORAL
  Filled 2023-02-26: qty 2

## 2023-02-26 NOTE — Discharge Instructions (Addendum)
Return for any problem.  Your laceration was repaired with absorbable sutures.  They do not need to be removed.  They should absorb and dissolve on their own.  Follow-up as instructed with your regular outpatient care provider in the next 2 to 3 days.  Additionally, your potassium was mildly below normal today.  It should be rechecked sometime in the next week to make sure that it is at normal levels.

## 2023-02-26 NOTE — ED Provider Notes (Signed)
[place] EMERGENCY DEPARTMENT AT [facility] Provider Note   CSN: [id number] Arrival date & time: 02/26/23  1350     History  Chief Complaint  Patient presents with   Fall   Laceration   Hyperglycemia    [name] is a 77 y.o. female.  77 year old female with prior medical history as detailed below.  Patient had an unwitnessed fall at her residence at [facility].  She did strike her head.  She has a 1 inch laceration to the mid forehead per report.  Patient reports that she "tripped up" and then fell.  She denies injury other than to her forehead.  She complains of mild headache.  Cervical collar placed by EMS.  She denies neck pain.  She denies extremity injury.  She is without confusion or delirium.  She appears to be comfortable.  The history is provided by the patient and medical records.       Home Medications Prior to Admission medications   Medication Sig Start Date End Date Taking? Authorizing Provider  albuterol (PROVENTIL) (2.5 MG/3ML) 0.083% nebulizer solution Take 3 mLs (2.5 mg total) by nebulization every 6 (six) hours as needed for wheezing. 10/18/22   Meredeth Ide, MD  artificial tears (LACRILUBE) OINT ophthalmic ointment Place into both eyes at bedtime. Patient taking differently: Place 1 Application into both eyes at bedtime. 09/09/22   Berton Mount I, MD  aspirin EC 81 MG tablet Take 1 tablet (81 mg total) by mouth daily. Swallow whole. 02/08/23   Narda Bonds, MD  Calcium Carb-Cholecalciferol (CALCIUM 600 + D PO) Take 1 tablet by mouth in the morning and at bedtime.    [provider]  cyanocobalamin (VITAMIN B12) 1000 MCG tablet Take 1,000 mcg by mouth daily.    [provider]  ezetimibe (ZETIA) 10 MG tablet Take 1 tablet (10 mg total) by mouth daily. 02/09/22   Frederica Kuster, MD  fish oil-omega-3 fatty acids 1000 MG capsule Take 1 g by mouth daily.    [provider]  furosemide  (LASIX) 80 MG tablet Take 80 mg by mouth daily. 01/23/23   [provider]  insulin aspart (NOVOLOG) 100 UNIT/ML injection inject 4 units before breakfast. Inject 6 units before lunch. Inject 8 units before supper. Dx: E11.59 Patient taking differently: Inject 12 Units into the skin 3 (three) times daily. Before Meals. 10/10/22   Sharon Seller, NP  Insulin Glargine (BASAGLAR KWIKPEN) 100 UNIT/ML Inject 10 Units into the skin 2 (two) times daily. 02/07/23   Narda Bonds, MD  lactulose (CHRONULAC) 10 GM/15ML solution Take 30 mLs (20 g total) by mouth 2 (two) times daily. 02/07/23   Narda Bonds, MD  lidocaine (LIDODERM) 5 % Place 1 patch onto the skin daily. Apply to right posterior neck topically on in am, off at HS for pain. 12/18/22   [provider]  magnesium oxide (MAG-OX) 400 (240 Mg) MG tablet Take 400 mg by mouth at bedtime.    [provider]  metoprolol tartrate (LOPRESSOR) 25 MG tablet Take 1 tablet (25 mg total) by mouth 3 (three) times daily. 02/07/23   Narda Bonds, MD  pantoprazole (PROTONIX) 40 MG tablet TAKE 1 TABLET(40 MG) BY MOUTH DAILY Patient taking differently: Take 40 mg by mouth daily. 10/25/21   Frederica Kuster, MD  rifaximin (XIFAXAN) 550 MG TABS tablet Take 550 mg by mouth 2 (two) times daily.    [provider]  spironolactone (ALDACTONE) 50 MG tablet Take 50 mg by mouth daily. 01/25/23   [provider]      Allergies    Kiwi extract, Gabapentin, Tdap [tetanus-diphth-acell pertussis], Latex, Statins, and Tramadol    Review of Systems   Review of Systems  All other systems reviewed and are negative.   Physical Exam Updated Vital Signs BP 120/74 (BP Location: Right Arm)   Pulse 82   Temp 98.2 F (36.8 C) (Oral)   Resp 18   Ht 5\' 2"  (1.575 m)   Wt 68 kg   LMP  (LMP Unknown)   SpO2 99%   BMI 27.42 kg/m  Physical Exam Vitals and nursing note reviewed.  Constitutional:      General: She is not in acute  distress.    Appearance: Normal appearance. She is well-developed.  HENT:     Head: Normocephalic.     Comments: 1 inch vertical laceration to the mid forehead noted.  No active bleeding Eyes:     Conjunctiva/sclera: Conjunctivae normal.     Pupils: Pupils are equal, round, and reactive to light.  Neck:     Comments: EMS cervical collar in place. Cardiovascular:     Rate and Rhythm: Normal rate and regular rhythm.     Heart sounds: Normal heart sounds.  Pulmonary:     Effort: Pulmonary effort is normal. No respiratory distress.     Breath sounds: Normal breath sounds.  Abdominal:     General: There is no distension.     Palpations: Abdomen is soft.     Tenderness: There is no abdominal tenderness.  Musculoskeletal:        General: No deformity. Normal range of motion.     Cervical back: Normal range of motion.  Skin:    General: Skin is warm and dry.  Neurological:     General: No focal deficit present.     Mental Status: She is alert and oriented to person, place, and time.     ED Results / Procedures / Treatments   Labs (all labs ordered are listed, but only abnormal results are displayed) Labs Reviewed - No data to display  EKG None  Radiology No results found.  Procedures .[name] KitchenLaceration Repair  Date/Time: 02/26/2023 3:36 PM  Performed by: Wynetta Fines, MD Authorized by: Wynetta Fines, MD   Consent:    Consent obtained:  Verbal   Consent given by:  Patient   Risks, benefits, and alternatives were discussed: yes     Risks discussed:  Infection, need for additional repair, nerve damage, poor wound healing, poor cosmetic result, pain, retained foreign body, tendon damage and vascular damage   Alternatives discussed:  No treatment Universal protocol:    Immediately prior to procedure, a time out was called: yes     Patient identity confirmed:  Verbally with patient Anesthesia:    Anesthesia method:  Local infiltration   Local anesthetic:  Lidocaine 2%  WITH epi Laceration details:    Location:  Face   Face location:  Forehead   Length (cm):  1.5 Pre-procedure details:    Preparation:  Patient was prepped and draped in usual sterile fashion and imaging obtained to evaluate for foreign bodies Exploration:    Limited defect created (wound extended): yes     Hemostasis achieved with:  Direct pressure   Imaging outcome: foreign body not noted     Wound exploration: wound explored through full range of motion and entire depth of wound visualized  Contaminated: no   Treatment:    Area cleansed with:  Povidone-iodine and saline   Amount of cleaning:  Extensive   Irrigation solution:  Sterile saline   Irrigation method:  Syringe   Visualized foreign bodies/material removed: no     Debridement:  None Skin repair:    Repair method:  Sutures   Suture size:  5-0   Suture technique:  Running Approximation:    Approximation:  Close Repair type:    Repair type:  Simple Post-procedure details:    Dressing:  Bulky dressing   Procedure completion:  Tolerated     Medications Ordered in ED Medications  lidocaine-EPINEPHrine (XYLOCAINE W/EPI) 2 %-1:200000 (PF) injection 10 mL (has no administration in time range)    ED Course/ Medical Decision Making/ A&P                                 Medical Decision Making Amount and/or Complexity of Data Reviewed Labs: ordered. Radiology: ordered.  Risk Prescription drug management.    Medical Screen Complete  This patient presented to the ED with complaint of fall, head injury, forehead laceration.  This complaint involves an extensive number of treatment options. The initial differential diagnosis includes, but is not limited to, trauma related to fall, metabolic abnormality, etc.  This presentation is: Acute, Chronic, Self-Limited, Previously Undiagnosed, Uncertain Prognosis, Complicated, Systemic Symptoms, and Threat to Life/Bodily Function  Patient presents for evaluation after  falling from her bed [facility].  She did strike her head.  She did have a forehead laceration.  Laceration repaired without difficulty.  Imaging obtained does not reveal acute traumatic injury or other significant acute pathology.  Screening labs obtained are without abnormality requiring admission.  Patient with noted mild hyperglycemia and mild hypokalemia.   Patient feels improved after ED evaluation and desires discharge back to [facility].  Patient's husband contacted by phone.  All findings discussed with the patient's husband.  He agrees with plan for discharge and close follow-up with care providers at [facility].  Importance close follow-up stressed.  Strict return precautions given and understood.  Additional history obtained: External records from outside sources obtained and reviewed including prior ED visits and prior Inpatient records.    Lab Tests:  I ordered and personally interpreted labs.  The pertinent results include: CBC, CMP   Imaging Studies ordered:  I ordered imaging studies including CT head, CT C-spine, plain films of chest and pelvis I independently visualized and interpreted obtained imaging which showed NAD I agree with the radiologist interpretation.   Cardiac Monitoring:  The patient was maintained on a cardiac monitor.  I personally viewed and interpreted the cardiac monitor which showed an underlying rhythm of: NSR   Medicines ordered:  I ordered medication including potassium for hypokalemia Reevaluation of the patient after these medicines showed that the patient: improved    Problem List / ED Course:  Fall, head injury, forehead laceration, hypokalemia, hyperglycemia   Reevaluation:  After the interventions noted above, I reevaluated the patient and found that they have: improved  Disposition:  After consideration of the diagnostic results and the patients response to treatment, I feel that the patent would benefit from close  outpatient follow-up.          Final Clinical Impression(s) / ED Diagnoses Final diagnoses:  Injury of head, initial encounter  Fall, initial encounter  Facial laceration, initial encounter    Rx / DC  Orders ED Discharge Orders     None         Wynetta Fines, MD 02/26/23 [phone]

## 2023-02-26 NOTE — ED Notes (Signed)
[facility] called for transport 

## 2023-02-26 NOTE — ED Triage Notes (Signed)
Pt BIBA from [facility]. Pt had unwitnessed fall, and has 1 in lac on forehead. No blood thinners, pt states they "tripped up". Being treated for a UTI.  Aox4  22 L hand  CBG: 471

## 2023-02-28 DIAGNOSIS — R2681 Unsteadiness on feet: Secondary | ICD-10-CM | POA: Diagnosis not present

## 2023-02-28 DIAGNOSIS — G9341 Metabolic encephalopathy: Secondary | ICD-10-CM | POA: Diagnosis not present

## 2023-02-28 DIAGNOSIS — M6281 Muscle weakness (generalized): Secondary | ICD-10-CM | POA: Diagnosis not present

## 2023-03-01 DIAGNOSIS — L89153 Pressure ulcer of sacral region, stage 3: Secondary | ICD-10-CM | POA: Diagnosis not present

## 2023-03-07 DIAGNOSIS — R2681 Unsteadiness on feet: Secondary | ICD-10-CM | POA: Diagnosis not present

## 2023-03-07 DIAGNOSIS — G9341 Metabolic encephalopathy: Secondary | ICD-10-CM | POA: Diagnosis not present

## 2023-03-07 DIAGNOSIS — M6281 Muscle weakness (generalized): Secondary | ICD-10-CM | POA: Diagnosis not present

## 2023-03-08 DIAGNOSIS — G9341 Metabolic encephalopathy: Secondary | ICD-10-CM | POA: Diagnosis not present

## 2023-03-08 DIAGNOSIS — M6281 Muscle weakness (generalized): Secondary | ICD-10-CM | POA: Diagnosis not present

## 2023-03-08 DIAGNOSIS — R2681 Unsteadiness on feet: Secondary | ICD-10-CM | POA: Diagnosis not present

## 2023-03-08 DIAGNOSIS — L89153 Pressure ulcer of sacral region, stage 3: Secondary | ICD-10-CM | POA: Diagnosis not present

## 2023-03-09 DIAGNOSIS — R410 Disorientation, unspecified: Secondary | ICD-10-CM | POA: Diagnosis not present

## 2023-03-09 DIAGNOSIS — R2681 Unsteadiness on feet: Secondary | ICD-10-CM | POA: Diagnosis not present

## 2023-03-09 DIAGNOSIS — M5030 Other cervical disc degeneration, unspecified cervical region: Secondary | ICD-10-CM | POA: Diagnosis not present

## 2023-03-09 DIAGNOSIS — L89153 Pressure ulcer of sacral region, stage 3: Secondary | ICD-10-CM | POA: Diagnosis not present

## 2023-03-09 DIAGNOSIS — Z7189 Other specified counseling: Secondary | ICD-10-CM | POA: Diagnosis not present

## 2023-03-09 DIAGNOSIS — K7581 Nonalcoholic steatohepatitis (NASH): Secondary | ICD-10-CM | POA: Diagnosis not present

## 2023-03-09 DIAGNOSIS — R54 Age-related physical debility: Secondary | ICD-10-CM | POA: Diagnosis not present

## 2023-03-09 DIAGNOSIS — K7460 Unspecified cirrhosis of liver: Secondary | ICD-10-CM | POA: Diagnosis not present

## 2023-03-09 DIAGNOSIS — M6281 Muscle weakness (generalized): Secondary | ICD-10-CM | POA: Diagnosis not present

## 2023-03-09 DIAGNOSIS — I8500 Esophageal varices without bleeding: Secondary | ICD-10-CM | POA: Diagnosis not present

## 2023-03-09 DIAGNOSIS — G9341 Metabolic encephalopathy: Secondary | ICD-10-CM | POA: Diagnosis not present

## 2023-03-09 DIAGNOSIS — F3289 Other specified depressive episodes: Secondary | ICD-10-CM | POA: Diagnosis not present

## 2023-03-09 DIAGNOSIS — Z515 Encounter for palliative care: Secondary | ICD-10-CM | POA: Diagnosis not present

## 2023-03-09 DIAGNOSIS — I5032 Chronic diastolic (congestive) heart failure: Secondary | ICD-10-CM | POA: Diagnosis not present

## 2023-03-14 DIAGNOSIS — G9341 Metabolic encephalopathy: Secondary | ICD-10-CM | POA: Diagnosis not present

## 2023-03-14 DIAGNOSIS — R2681 Unsteadiness on feet: Secondary | ICD-10-CM | POA: Diagnosis not present

## 2023-03-14 DIAGNOSIS — M6281 Muscle weakness (generalized): Secondary | ICD-10-CM | POA: Diagnosis not present

## 2023-03-15 DIAGNOSIS — M6281 Muscle weakness (generalized): Secondary | ICD-10-CM | POA: Diagnosis not present

## 2023-03-15 DIAGNOSIS — L89153 Pressure ulcer of sacral region, stage 3: Secondary | ICD-10-CM | POA: Diagnosis not present

## 2023-03-15 DIAGNOSIS — R2681 Unsteadiness on feet: Secondary | ICD-10-CM | POA: Diagnosis not present

## 2023-03-15 DIAGNOSIS — G9341 Metabolic encephalopathy: Secondary | ICD-10-CM | POA: Diagnosis not present

## 2023-03-16 DIAGNOSIS — R2681 Unsteadiness on feet: Secondary | ICD-10-CM | POA: Diagnosis not present

## 2023-03-16 DIAGNOSIS — M6281 Muscle weakness (generalized): Secondary | ICD-10-CM | POA: Diagnosis not present

## 2023-03-16 DIAGNOSIS — G9341 Metabolic encephalopathy: Secondary | ICD-10-CM | POA: Diagnosis not present

## 2023-03-17 DIAGNOSIS — G9341 Metabolic encephalopathy: Secondary | ICD-10-CM | POA: Diagnosis not present

## 2023-03-17 DIAGNOSIS — M6281 Muscle weakness (generalized): Secondary | ICD-10-CM | POA: Diagnosis not present

## 2023-03-17 DIAGNOSIS — R2681 Unsteadiness on feet: Secondary | ICD-10-CM | POA: Diagnosis not present

## 2023-03-22 DIAGNOSIS — L89153 Pressure ulcer of sacral region, stage 3: Secondary | ICD-10-CM | POA: Diagnosis not present

## 2023-03-27 DIAGNOSIS — K7581 Nonalcoholic steatohepatitis (NASH): Secondary | ICD-10-CM | POA: Diagnosis not present

## 2023-03-27 DIAGNOSIS — R54 Age-related physical debility: Secondary | ICD-10-CM | POA: Diagnosis not present

## 2023-03-27 DIAGNOSIS — F3289 Other specified depressive episodes: Secondary | ICD-10-CM | POA: Diagnosis not present

## 2023-03-27 DIAGNOSIS — I8500 Esophageal varices without bleeding: Secondary | ICD-10-CM | POA: Diagnosis not present

## 2023-03-27 DIAGNOSIS — R410 Disorientation, unspecified: Secondary | ICD-10-CM | POA: Diagnosis not present

## 2023-03-27 DIAGNOSIS — M5030 Other cervical disc degeneration, unspecified cervical region: Secondary | ICD-10-CM | POA: Diagnosis not present

## 2023-03-27 DIAGNOSIS — Z7189 Other specified counseling: Secondary | ICD-10-CM | POA: Diagnosis not present

## 2023-03-27 DIAGNOSIS — G9341 Metabolic encephalopathy: Secondary | ICD-10-CM | POA: Diagnosis not present

## 2023-03-27 DIAGNOSIS — I5032 Chronic diastolic (congestive) heart failure: Secondary | ICD-10-CM | POA: Diagnosis not present

## 2023-03-27 DIAGNOSIS — K7460 Unspecified cirrhosis of liver: Secondary | ICD-10-CM | POA: Diagnosis not present

## 2023-03-27 DIAGNOSIS — Z515 Encounter for palliative care: Secondary | ICD-10-CM | POA: Diagnosis not present

## 2023-03-27 DIAGNOSIS — L89153 Pressure ulcer of sacral region, stage 3: Secondary | ICD-10-CM | POA: Diagnosis not present

## 2023-03-28 ENCOUNTER — Emergency Department (HOSPITAL_COMMUNITY): Payer: Medicare Other

## 2023-03-28 ENCOUNTER — Telehealth: Payer: Self-pay | Admitting: Sports Medicine

## 2023-03-28 ENCOUNTER — Emergency Department (HOSPITAL_COMMUNITY)
Admission: EM | Admit: 2023-03-28 | Discharge: 2023-04-09 | Disposition: E | Payer: Medicare Other | Attending: Emergency Medicine | Admitting: Emergency Medicine

## 2023-03-28 DIAGNOSIS — E1140 Type 2 diabetes mellitus with diabetic neuropathy, unspecified: Secondary | ICD-10-CM | POA: Diagnosis not present

## 2023-03-28 DIAGNOSIS — N181 Chronic kidney disease, stage 1: Secondary | ICD-10-CM | POA: Diagnosis not present

## 2023-03-28 DIAGNOSIS — Z79899 Other long term (current) drug therapy: Secondary | ICD-10-CM | POA: Diagnosis not present

## 2023-03-28 DIAGNOSIS — Z91040 Latex allergy status: Secondary | ICD-10-CM | POA: Diagnosis not present

## 2023-03-28 DIAGNOSIS — Z515 Encounter for palliative care: Secondary | ICD-10-CM

## 2023-03-28 DIAGNOSIS — Z8616 Personal history of COVID-19: Secondary | ICD-10-CM | POA: Insufficient documentation

## 2023-03-28 DIAGNOSIS — I499 Cardiac arrhythmia, unspecified: Secondary | ICD-10-CM | POA: Diagnosis not present

## 2023-03-28 DIAGNOSIS — R000 Tachycardia, unspecified: Secondary | ICD-10-CM | POA: Insufficient documentation

## 2023-03-28 DIAGNOSIS — I4891 Unspecified atrial fibrillation: Secondary | ICD-10-CM | POA: Diagnosis not present

## 2023-03-28 DIAGNOSIS — I130 Hypertensive heart and chronic kidney disease with heart failure and stage 1 through stage 4 chronic kidney disease, or unspecified chronic kidney disease: Secondary | ICD-10-CM | POA: Insufficient documentation

## 2023-03-28 DIAGNOSIS — I5032 Chronic diastolic (congestive) heart failure: Secondary | ICD-10-CM | POA: Diagnosis not present

## 2023-03-28 DIAGNOSIS — S2241XA Multiple fractures of ribs, right side, initial encounter for closed fracture: Secondary | ICD-10-CM | POA: Diagnosis not present

## 2023-03-28 DIAGNOSIS — I2510 Atherosclerotic heart disease of native coronary artery without angina pectoris: Secondary | ICD-10-CM | POA: Diagnosis not present

## 2023-03-28 DIAGNOSIS — Z66 Do not resuscitate: Secondary | ICD-10-CM | POA: Diagnosis not present

## 2023-03-28 DIAGNOSIS — Z794 Long term (current) use of insulin: Secondary | ICD-10-CM | POA: Insufficient documentation

## 2023-03-28 DIAGNOSIS — J9601 Acute respiratory failure with hypoxia: Secondary | ICD-10-CM

## 2023-03-28 DIAGNOSIS — Z7982 Long term (current) use of aspirin: Secondary | ICD-10-CM | POA: Diagnosis not present

## 2023-03-28 DIAGNOSIS — Z85828 Personal history of other malignant neoplasm of skin: Secondary | ICD-10-CM | POA: Insufficient documentation

## 2023-03-28 DIAGNOSIS — Z951 Presence of aortocoronary bypass graft: Secondary | ICD-10-CM | POA: Diagnosis not present

## 2023-03-28 DIAGNOSIS — Z4682 Encounter for fitting and adjustment of non-vascular catheter: Secondary | ICD-10-CM | POA: Diagnosis not present

## 2023-03-28 DIAGNOSIS — R4182 Altered mental status, unspecified: Secondary | ICD-10-CM | POA: Diagnosis not present

## 2023-03-28 DIAGNOSIS — E7220 Disorder of urea cycle metabolism, unspecified: Secondary | ICD-10-CM | POA: Insufficient documentation

## 2023-03-28 DIAGNOSIS — R404 Transient alteration of awareness: Secondary | ICD-10-CM | POA: Diagnosis not present

## 2023-03-28 DIAGNOSIS — E1122 Type 2 diabetes mellitus with diabetic chronic kidney disease: Secondary | ICD-10-CM | POA: Insufficient documentation

## 2023-03-28 DIAGNOSIS — K7682 Hepatic encephalopathy: Secondary | ICD-10-CM | POA: Diagnosis not present

## 2023-03-28 DIAGNOSIS — R0689 Other abnormalities of breathing: Secondary | ICD-10-CM | POA: Diagnosis not present

## 2023-03-28 DIAGNOSIS — R918 Other nonspecific abnormal finding of lung field: Secondary | ICD-10-CM | POA: Diagnosis not present

## 2023-03-28 LAB — COMPREHENSIVE METABOLIC PANEL
ALT: 30 U/L (ref 0–44)
AST: 66 U/L — ABNORMAL HIGH (ref 15–41)
Albumin: 2.6 g/dL — ABNORMAL LOW (ref 3.5–5.0)
Alkaline Phosphatase: 150 U/L — ABNORMAL HIGH (ref 38–126)
Anion gap: 17 — ABNORMAL HIGH (ref 5–15)
BUN: 23 mg/dL (ref 8–23)
CO2: 24 mmol/L (ref 22–32)
Calcium: 9.6 mg/dL (ref 8.9–10.3)
Chloride: 100 mmol/L (ref 98–111)
Creatinine, Ser: 1.42 mg/dL — ABNORMAL HIGH (ref 0.44–1.00)
GFR, Estimated: 38 mL/min — ABNORMAL LOW (ref >60)
Glucose, Bld: 191 mg/dL — ABNORMAL HIGH (ref 70–99)
Potassium: 2.9 mmol/L — ABNORMAL LOW (ref 3.5–5.1)
Sodium: 141 mmol/L (ref 135–145)
Total Bilirubin: 2.8 mg/dL — ABNORMAL HIGH (ref <1.2)
Total Protein: 6.8 g/dL (ref 6.5–8.1)

## 2023-03-28 LAB — CBC WITH DIFFERENTIAL/PLATELET
Abs Immature Granulocytes: 0.05 10*3/uL (ref 0.00–0.07)
Basophils Absolute: 0.1 10*3/uL (ref 0.0–0.1)
Basophils Relative: 1 %
Eosinophils Absolute: 0 10*3/uL (ref 0.0–0.5)
Eosinophils Relative: 0 %
HCT: 44.1 % (ref 36.0–46.0)
Hemoglobin: 14.4 g/dL (ref 12.0–15.0)
Immature Granulocytes: 1 %
Lymphocytes Relative: 7 %
Lymphs Abs: 0.8 10*3/uL (ref 0.7–4.0)
MCH: 30.7 pg (ref 26.0–34.0)
MCHC: 32.7 g/dL (ref 30.0–36.0)
MCV: 94 fL (ref 80.0–100.0)
Monocytes Absolute: 0.6 10*3/uL (ref 0.1–1.0)
Monocytes Relative: 5 %
Neutro Abs: 9.6 10*3/uL — ABNORMAL HIGH (ref 1.7–7.7)
Neutrophils Relative %: 86 %
Platelets: 199 10*3/uL (ref 150–400)
RBC: 4.69 MIL/uL (ref 3.87–5.11)
RDW: 15.2 % (ref 11.5–15.5)
WBC: 11.1 10*3/uL — ABNORMAL HIGH (ref 4.0–10.5)
nRBC: 0 % (ref 0.0–0.2)

## 2023-03-28 LAB — I-STAT CHEM 8, ED
BUN: 26 mg/dL — ABNORMAL HIGH (ref 8–23)
Calcium, Ion: 1.12 mmol/L — ABNORMAL LOW (ref 1.15–1.40)
Chloride: 101 mmol/L (ref 98–111)
Creatinine, Ser: 1.3 mg/dL — ABNORMAL HIGH (ref 0.44–1.00)
Glucose, Bld: 185 mg/dL — ABNORMAL HIGH (ref 70–99)
HCT: 43 % (ref 36.0–46.0)
Hemoglobin: 14.6 g/dL (ref 12.0–15.0)
Potassium: 2.8 mmol/L — ABNORMAL LOW (ref 3.5–5.1)
Sodium: 144 mmol/L (ref 135–145)
TCO2: 27 mmol/L (ref 22–32)

## 2023-03-28 LAB — I-STAT ARTERIAL BLOOD GAS, ED
Acid-Base Excess: 4 mmol/L — ABNORMAL HIGH (ref 0.0–2.0)
Bicarbonate: 29 mmol/L — ABNORMAL HIGH (ref 20.0–28.0)
Calcium, Ion: >2.5 mmol/L (ref 1.15–1.40)
HCT: 36 % (ref 36.0–46.0)
Hemoglobin: 12.2 g/dL (ref 12.0–15.0)
O2 Saturation: 100 %
Patient temperature: 98.1
Potassium: 2.3 mmol/L — CL (ref 3.5–5.1)
Sodium: 141 mmol/L (ref 135–145)
TCO2: 30 mmol/L (ref 22–32)
pCO2 arterial: 45.2 mm[Hg] (ref 32–48)
pH, Arterial: 7.414 (ref 7.35–7.45)
pO2, Arterial: 472 mm[Hg] — ABNORMAL HIGH (ref 83–108)

## 2023-03-28 LAB — AMMONIA: Ammonia: 269 umol/L — ABNORMAL HIGH (ref 9–35)

## 2023-03-28 LAB — I-STAT CG4 LACTIC ACID, ED: Lactic Acid, Venous: 6.1 mmol/L (ref 0.5–1.9)

## 2023-03-28 MED ORDER — CALCIUM CHLORIDE 10 % IV SOLN
1.0000 g | Freq: Once | INTRAVENOUS | Status: AC
  Administered 2023-03-28: 1 g via INTRAVENOUS

## 2023-03-28 MED ORDER — FENTANYL BOLUS VIA INFUSION: 50.0000 ug | INTRAVENOUS | Status: DC | PRN

## 2023-03-28 MED ORDER — DILTIAZEM LOAD VIA INFUSION
15.0000 mg | Freq: Once | INTRAVENOUS | Status: AC
  Administered 2023-03-28: 15 mg via INTRAVENOUS
  Filled 2023-03-28: qty 15

## 2023-03-28 MED ORDER — FENTANYL CITRATE PF 50 MCG/ML IJ SOSY: 25.0000 ug | PREFILLED_SYRINGE | Freq: Once | INTRAMUSCULAR | Status: DC

## 2023-03-28 MED ORDER — POTASSIUM CHLORIDE 10 MEQ/100ML IV SOLN
10.0000 meq | Freq: Once | INTRAVENOUS | Status: AC
  Administered 2023-03-28: 10 meq via INTRAVENOUS
  Filled 2023-03-28: qty 100

## 2023-03-28 MED ORDER — DILTIAZEM HCL-DEXTROSE 125-5 MG/125ML-% IV SOLN (PREMIX)
5.0000 mg/h | INTRAVENOUS | Status: DC
Start: 2023-03-28 — End: 2023-03-28
  Administered 2023-03-28: 5 mg/h via INTRAVENOUS
  Filled 2023-03-28: qty 125

## 2023-03-28 MED ORDER — LACTATED RINGERS IV BOLUS: 1000.0000 mL | Freq: Once | INTRAVENOUS | Status: DC

## 2023-03-28 MED ORDER — FENTANYL BOLUS VIA INFUSION: 25.0000 ug | INTRAVENOUS | Status: DC | PRN

## 2023-03-28 MED ORDER — ROCURONIUM BROMIDE 10 MG/ML (PF) SYRINGE
PREFILLED_SYRINGE | INTRAVENOUS | Status: AC
  Filled 2023-03-28: qty 10

## 2023-03-28 MED ORDER — SODIUM CHLORIDE 0.9 % IV SOLN
1.0000 g | Freq: Once | INTRAVENOUS | Status: AC
  Administered 2023-03-28: 1 g via INTRAVENOUS
  Filled 2023-03-28: qty 10

## 2023-03-28 MED ORDER — FENTANYL 2500MCG IN NS 250ML (10MCG/ML) PREMIX INFUSION
INTRAVENOUS | Status: AC
  Filled 2023-03-28: qty 250

## 2023-03-28 MED ORDER — LACTULOSE 10 GM/15ML PO SOLN: 30.0000 g | Freq: Once | ORAL | Status: DC

## 2023-03-28 MED ORDER — DEXMEDETOMIDINE HCL IN NACL 400 MCG/100ML IV SOLN
0.0000 ug/kg/h | INTRAVENOUS | Status: DC
  Administered 2023-03-28: 0.4 ug/kg/h via INTRAVENOUS
  Filled 2023-03-28: qty 100

## 2023-03-28 MED ORDER — PROPOFOL 1000 MG/100ML IV EMUL: 0.0000 ug/kg/min | INTRAVENOUS | Status: DC

## 2023-03-28 MED ORDER — IOHEXOL 350 MG/ML SOLN
75.0000 mL | Freq: Once | INTRAVENOUS | Status: AC | PRN
  Administered 2023-03-28: 75 mL via INTRAVENOUS

## 2023-03-28 MED ORDER — PROPOFOL 1000 MG/100ML IV EMUL
INTRAVENOUS | Status: AC
  Filled 2023-03-28: qty 100

## 2023-03-28 MED ORDER — LACTATED RINGERS IV BOLUS
1000.0000 mL | Freq: Once | INTRAVENOUS | Status: AC
  Administered 2023-03-28: 1000 mL via INTRAVENOUS

## 2023-03-28 MED ORDER — FENTANYL 2500MCG IN NS 250ML (10MCG/ML) PREMIX INFUSION: 25.0000 ug/h | INTRAVENOUS | Status: DC

## 2023-04-02 LAB — CULTURE, BLOOD (ROUTINE X 2)
Culture: NO GROWTH
Culture: NO GROWTH

## 2023-04-09 NOTE — Progress Notes (Signed)
03/16/2023 10:22 AM pulmonary critical care Pulmonary critical care was called to bedside to do a admission the son was at the bedside he confirmed the patient wants to DO NOT RESUSCITATE as we looked through her paperwork she has a DO NOT RESUSCITATE order.  Reports she been going downhill for several years now and her whole family had agreed that they did not want her heroic measures taken.  Unfortunately the DNR was had in her paperwork when she came in she got intubated.  I spoke with Dr. Andria Meuse the emergency room physician and with Dr. Levy Pupa pulmonary critical care physician they both agree that she can be extubate in the emergency department and that pulmonary critical care is not needed at this time.  To Dr. Andria Meuse should he need pulmonary critical care at any time just to repaged on the critical care pager.      Brett Canales Vanna Shavers ACNP Acute Care Nurse Practitioner [facility] Pulmonary/Critical Care Please consult Amion 03/19/2023, 10:57 AM

## 2023-04-09 NOTE — Telephone Encounter (Signed)
I received a phone call from Dr. Ladona Ridgel from [facility] room that MRN: [id number] [name] had passed away at 1:40 pm on 04-18-23.

## 2023-04-09 NOTE — ED Provider Notes (Addendum)
[place] EMERGENCY DEPARTMENT AT [facility] Provider Note  CSN: [id number] Arrival date & time: 03/23/2023 5638  Chief Complaint(s) catatonic/ gaze  HPI [name] is a 77 y.o. female here today for altered mental status.  History is obtained entirely via EMS.  Patient has a past medical history significant for metabolic encephalopathy, atrial fibrillation, liver cirrhosis secondary to [facility], esophageal varices, chronic respiratory failure with hypoxia.  Reportedly, patient was not responsive this morning which is a change from baseline.  Patient is aphasic at baseline.  She does not have advanced directives on file, and EMS states that the nursing facility did not have an advanced directive.  At baseline, patient reportedly can follow commands.   Past Medical History Past Medical History:  Diagnosis Date   Chronic cystitis    Chronic diastolic CHF (congestive heart failure) (HCC)    followed by dr Eden Emms   Chronic kidney disease, stage I    Cirrhosis of liver without ascites (HCC) 03/2016   followed by dr Christella Hartigan (GI);   due to fatty liver;   post op cabg 09/ 2017  , post paracentesis for ascites   Coronary artery disease 01/2016   cardiologist--- dr Estrella Myrtle;   01-27-2016  NSTEMI  s/p cardiac cath;   s/p CABG 01/29/16: LIMA-LAD, SVG-RI, SVG-dRCA   DDD (degenerative disc disease), cervical    per pt w/ chronic neck pain   Dyspnea    occasional   Edema of right lower extremity    Esophageal varices (HCC)    followed by dr Christella Hartigan;   hx multiple banding of large varices   Essential hypertension    no meds   GERD (gastroesophageal reflux disease)    Heart murmur     DX FOR YEARS ASYMPTOMATIC   Hepatic encephalopathy (HCC)    History of adenomatous polyp of colon    History of COVID-19 04/2020   per pt mild  symptoms that resolve   History of kidney stones    passed stones   History of non-ST elevation myocardial infarction (NSTEMI) 01/27/2016    Hyperlipidemia    diet controlled   NAFLD (nonalcoholic fatty liver disease)    Neuropathy, peripheral 04/01/2015   OA (osteoarthritis)    Paroxysmal A-fib (HCC)    08/19/19 afib with RVR, spontaneously converted to SR   Portal vein thrombosis    per dr Christella Hartigan  ,  chronic non-occluded   Thrombocytopenia (HCC)    followed by pcp  and hematology-- dr Mosetta Putt;   likely secondary to liver cirrhosis   Type 2 diabetes mellitus treated with insulin (HCC)    Type 2, followed by pcp    (07-21-2021  per pt checks blood sugar dialy in am fasting average 113--200s)   Urge incontinence of urine    Wears hearing aid in both ears    Patient Active Problem List   Diagnosis Date Noted   Leg swelling 01/30/2023   Chronic respiratory failure with hypoxia (HCC) 01/29/2023   Acute cystitis 01/29/2023   Acute on chronic HFrEF (heart failure with reduced ejection fraction) (HCC) 01/28/2023   Atrial fibrillation with RVR (HCC) 01/28/2023   Leukopenia 11/27/2022   UTI (urinary tract infection) 10/13/2022   Acute metabolic encephalopathy 09/03/2022   Elevated troponin 09/03/2022   Hypocalcemia 09/03/2022   Pressure injury of skin 09/03/2022   Traumatic subdural hematoma (HCC) 08/21/2022   Prolonged QT interval 08/21/2022   Subarachnoid hemorrhage (HCC) 08/21/2022   Lumbar transverse process fracture (HCC) 08/21/2022  Fall at home 08/21/2022   Wound of left leg 08/21/2022   Pancytopenia (HCC) 08/21/2022   Mucocele of appendix 08/21/2022   Weight loss 08/21/2022   Vertigo 07/01/2022   Chronic diastolic CHF (congestive heart failure) (HCC) 07/01/2022   GAD (generalized anxiety disorder) 07/01/2022   Bilateral lower extremity edema 07/01/2022   Tenderness of left calf 07/01/2022   Left hip pain 07/01/2022   Hypoalbuminemia 07/01/2022   S/P lumbar fusion 02/25/2022   Pyohydronephrosis 05/29/2021   URI (upper respiratory infection) 05/12/2021   Lymphopenia 11/05/2020   CHF (congestive heart failure)  (HCC)    Coronary artery disease    Type II diabetes mellitus (HCC)    COVID-19 virus infection 04/2020   A-fib (HCC) 08/19/2019   S/P TIPS (transjugular intrahepatic portosystemic shunt)    Hypercalcemia 05/26/2019   AKI (acute kidney injury) (HCC)    Hyponatremia    Acute lower UTI 05/16/2019   Acute delirium    General weakness 05/15/2019   Encephalopathy, hepatic (HCC) 05/15/2019   Volume depletion 05/15/2019   Bleeding esophageal varices (HCC)    UGI bleed    Infarction of spleen 04/06/2019   Odynophagia 04/01/2019   Dysphagia    Hepatoma (HCC) 01/31/2019   Thrombocytopenia (HCC) 09/25/2017   Left forearm pain 05/29/2017   Portal vein thrombosis 04/22/2017   Skin cancer of nose 08/23/2016   History of colonic polyps    Benign neoplasm of rectum    Esophageal varices without bleeding (HCC)    Portal hypertensive gastropathy (HCC)    Leg cramps 05/18/2016   Routine lab draw 05/13/2016   Iron deficiency anemia 05/09/2016   Statin-induced rhabdomyolysis 05/07/2016   Liver cirrhosis secondary to NASH (HCC) 04/05/2016   PVC's (premature ventricular contractions) 04/05/2016   Hypokalemia 02/16/2016   Coronary artery disease of native artery of native heart with stable angina pectoris (HCC) 02/16/2016   S/P CABG x 3 02/02/2016   History of non-ST elevation myocardial infarction (NSTEMI) 01/27/2016   Trochanteric bursitis of left hip 12/15/2015   Type 2 diabetes mellitus without complication (HCC) 12/15/2015   S/P lumbar spinal fusion 06/10/2015   Paresthesia 04/01/2015   Osteoporosis 03/31/2015   Hearing loss 06/25/2014   Insomnia 06/25/2014   Hair thinning 06/25/2014   Muscle spasm 10/08/2013   Biceps tendon rupture, proximal 11/24/2012   Hyperlipidemia    GERD (gastroesophageal reflux disease)    Essential hypertension    Home Medication(s) Prior to Admission medications   Medication Sig Start Date End Date Taking? Authorizing Provider  albuterol (PROVENTIL) (2.5  MG/3ML) 0.083% nebulizer solution Take 3 mLs (2.5 mg total) by nebulization every 6 (six) hours as needed for wheezing. 10/18/22   Meredeth Ide, MD  artificial tears (LACRILUBE) OINT ophthalmic ointment Place into both eyes at bedtime. Patient taking differently: Place 1 Application into both eyes at bedtime. 09/09/22   Berton Mount I, MD  aspirin EC 81 MG tablet Take 1 tablet (81 mg total) by mouth daily. Swallow whole. 02/08/23   Narda Bonds, MD  Calcium Carb-Cholecalciferol (CALCIUM 600 + D PO) Take 1 tablet by mouth in the morning and at bedtime.    [provider]  cyanocobalamin (VITAMIN B12) 1000 MCG tablet Take 1,000 mcg by mouth daily.    [provider]  ezetimibe (ZETIA) 10 MG tablet Take 1 tablet (10 mg total) by mouth daily. 02/09/22   Frederica Kuster, MD  fish oil-omega-3 fatty acids 1000 MG capsule Take 1 g by mouth daily.  [provider]  furosemide (LASIX) 80 MG tablet Take 80 mg by mouth daily. 01/23/23   [provider]  insulin aspart (NOVOLOG) 100 UNIT/ML injection inject 4 units before breakfast. Inject 6 units before lunch. Inject 8 units before supper. Dx: E11.59 Patient taking differently: Inject 12 Units into the skin 3 (three) times daily. Before Meals. 10/10/22   Sharon Seller, NP  Insulin Glargine (BASAGLAR KWIKPEN) 100 UNIT/ML Inject 10 Units into the skin 2 (two) times daily. 02/07/23   Narda Bonds, MD  lactulose (CHRONULAC) 10 GM/15ML solution Take 30 mLs (20 g total) by mouth 2 (two) times daily. 02/07/23   Narda Bonds, MD  lidocaine (LIDODERM) 5 % Place 1 patch onto the skin daily. Apply to right posterior neck topically on in am, off at HS for pain. 12/18/22   [provider]  magnesium oxide (MAG-OX) 400 (240 Mg) MG tablet Take 400 mg by mouth at bedtime.    [provider]  metoprolol tartrate (LOPRESSOR) 25 MG tablet Take 1 tablet (25 mg total) by mouth 3 (three) times daily. 02/07/23   Narda Bonds, MD  pantoprazole (PROTONIX) 40 MG tablet TAKE 1 TABLET(40 MG) BY MOUTH DAILY Patient taking differently: Take 40 mg by mouth daily. 10/25/21   Frederica Kuster, MD  rifaximin (XIFAXAN) 550 MG TABS tablet Take 550 mg by mouth 2 (two) times daily.    [provider]  spironolactone (ALDACTONE) 50 MG tablet Take 50 mg by mouth daily. 01/25/23   [provider]                                                                                                                                    Past Surgical History Past Surgical History:  Procedure Laterality Date   CARDIAC CATHETERIZATION N/A 01/27/2016   Procedure: Left Heart Cath and Coronary Angiography;  Surgeon: Lyn Records, MD;  [facility] INV[facility] LAB;  Service: Cardiovascular;  Laterality: N/A;   COLONOSCOPY WITH PROPOFOL N/A 07/07/2016   Procedure: COLONOSCOPY WITH PROPOFOL;  Surgeon: Rachael Fee, MD;  Location: [facility] ENDOSCOPY;  Service: Endoscopy;  Laterality: N/A;   CORONARY ARTERY BYPASS GRAFT N/A 01/28/2016   Procedure: CORONARY ARTERY BYPASS GRAFTING (CABG) x 3 USING RIGHT LEG GREATER SAPHENOUS VEIN GRAFT;  Surgeon: Loreli Slot, MD;  Location: [facility] OR;  Service: Open Heart Surgery;  Laterality: N/A;   CYSTOSCOPY WITH RETROGRADE PYELOGRAM, URETEROSCOPY AND STENT PLACEMENT Left 05/30/2021   Procedure: CYSTOSCOPY WITH RETROGRADE PYELOGRAM, URETEROSCOPY AND STENT PLACEMENT;  Surgeon: Rene Paci, MD;  Location: [facility] OR;  Service: Urology;  Laterality: Left;   CYSTOSCOPY/URETEROSCOPY/HOLMIUM LASER/STENT PLACEMENT Left 07/26/2021   Procedure: CYSTOSCOPY/RETROGRADE/URETEROSCOPY/HOLMIUM LASER/STENT EXCHANGE;  Surgeon: Jannifer Hick, MD;  Location: [facility];  Service: Urology;  Laterality: Left;   ENDOVEIN HARVEST OF GREATER SAPHENOUS VEIN Right 01/28/2016   Procedure: ENDOVEIN HARVEST OF GREATER SAPHENOUS  VEIN;  Surgeon: Loreli Slot, MD;  Location: [facility] OR;  Service:  Open Heart Surgery;  Laterality: Right;   ESOPHAGEAL BANDING  03/28/2019   Procedure: ESOPHAGEAL BANDING;  Surgeon: Rachael Fee, MD;  Location: [facility] ENDOSCOPY;  Service: Endoscopy;;   ESOPHAGEAL BANDING  04/07/2019   Procedure: ESOPHAGEAL BANDING;  Surgeon: Charna Evan, MD;  Location: [facility] ENDOSCOPY;  Service: Endoscopy;;   ESOPHAGOGASTRODUODENOSCOPY N/A 04/07/2019   Procedure: ESOPHAGOGASTRODUODENOSCOPY (EGD);  Surgeon: Charna Ahuva, MD;  Location: [facility] ENDOSCOPY;  Service: Endoscopy;  Laterality: N/A;   ESOPHAGOGASTRODUODENOSCOPY (EGD) WITH PROPOFOL N/A 07/07/2016   Procedure: ESOPHAGOGASTRODUODENOSCOPY (EGD) WITH PROPOFOL;  Surgeon: Rachael Fee, MD;  Location: [facility] ENDOSCOPY;  Service: Endoscopy;  Laterality: N/A;   ESOPHAGOGASTRODUODENOSCOPY (EGD) WITH PROPOFOL N/A 03/28/2019   Procedure: ESOPHAGOGASTRODUODENOSCOPY (EGD) WITH PROPOFOL;  Surgeon: Rachael Fee, MD;  Location: [facility] ENDOSCOPY;  Service: Endoscopy;  Laterality: N/A;   EXTRACORPOREAL SHOCK WAVE LITHOTRIPSY  2010   HEMOSTASIS CLIP PLACEMENT  04/07/2019   Procedure: HEMOSTASIS CLIP PLACEMENT;  Surgeon: Charna Terris, MD;  Location: [facility] ENDOSCOPY;  Service: Endoscopy;;   IR ANGIOGRAM SELECTIVE EACH ADDITIONAL VESSEL  04/08/2019   IR EMBO ART  VEN HEMORR LYMPH EXTRAV  INC GUIDE ROADMAPPING  04/08/2019   IR PARACENTESIS  04/08/2019   IR RADIOLOGIST EVAL & MGMT  06/13/2019   IR RADIOLOGIST EVAL & MGMT  09/25/2019   IR RADIOLOGIST EVAL & MGMT  07/07/2020   IR RADIOLOGIST EVAL & MGMT  07/30/2021   IR TIPS  04/08/2019   LAMINECTOMY WITH POSTERIOR LATERAL ARTHRODESIS LEVEL 1 Bilateral 02/25/2022   Procedure: Laminectomy and Foraminotomy - Lumbar four-Lumbar five - bilateral, posterolateral arthrodesis Lumbar four-five, extension of hardware Lumbar four-five;  Surgeon: Tia Alert, MD;  Location: [facility] OR;  Service: Neurosurgery;  Laterality: Bilateral;   MAXIMUM ACCESS (MAS)POSTERIOR LUMBAR INTERBODY FUSION (PLIF) 1 LEVEL Left  06/10/2015   Procedure: FOR MAXIMUM ACCESS (MAS) POSTERIOR LUMBAR INTERBODY FUSION (PLIF) LUMBAR THREE-FOUR EXTRAFORAMINAL MICRODISCECTOMY LUMBAR FIVE-SACRAL ONE LEFT;  Surgeon: Tia Alert, MD;  Location: [facility] NEURO ORS;  Service: Neurosurgery;  Laterality: Left;   RADIOLOGY WITH ANESTHESIA N/A 04/08/2019   Procedure: RADIOLOGY WITH ANESTHESIA;  Surgeon: Radiologist, Medication, MD;  Location: [facility] OR;  Service: Radiology;  Laterality: N/A;   SCLEROTHERAPY  04/07/2019   Procedure: SCLEROTHERAPY;  Surgeon: Charna Ellyana, MD;  Location: [facility] ENDOSCOPY;  Service: Endoscopy;;   TEE WITHOUT CARDIOVERSION N/A 01/28/2016   Procedure: TRANSESOPHAGEAL ECHOCARDIOGRAM (TEE);  Surgeon: Loreli Slot, MD;  Location: [facility] OR;  Service: Open Heart Surgery;  Laterality: N/A;   TUBAL LIGATION  1982   Dr Ninetta Lights HERNIA REPAIR N/A 09/10/2019   Procedure: HERNIA REPAIR UMBILICAL ADULT;  Surgeon: Manus Rudd, MD;  Location: [facility] OR;  Service: General;  Laterality: N/A;   VAGINAL HYSTERECTOMY  1997   Dr Vivien Rota   Family History Family History  Problem Relation Age of Onset   Heart disease Mother    Breast cancer Mother    Lung cancer Father    Arthritis Sister    Arthritis Brother    Liver cancer Brother    Lymphoma Brother    Heart disease Maternal Grandmother    Heart disease Maternal Grandfather    Heart disease Paternal Grandmother    Heart disease Paternal Grandfather    Other Daughter        house fire   Breast cancer Maternal Aunt    Breast cancer Paternal Aunt    Colon cancer Neg  Hx    Esophageal cancer Neg Hx    Rectal cancer Neg Hx    Stomach cancer Neg Hx     Social History Social History   Tobacco Use   Smoking status: Never   Smokeless tobacco: Never  Vaping Use   Vaping status: Never Used  Substance Use Topics   Alcohol use: No   Drug use: Never   Allergies Kiwi extract, Gabapentin, Tdap [tetanus-diphth-acell pertussis], Latex, Statins, and Tramadol  Review of  Systems Review of Systems  Physical Exam Vital Signs  I have reviewed the triage vital signs BP 138/81   Pulse 79   Temp 98.1 F (36.7 C) (Axillary)   Resp 18   Ht 5\' 2"  (1.575 m)   Wt 63.5 kg Comment: estimated weight  LMP  (LMP Unknown)   SpO2 100%   BMI 25.61 kg/m   Physical Exam Vitals reviewed.  Constitutional:      Appearance: She is ill-appearing.     Comments: Patient was not responsive  Eyes:     General: Scleral icterus present.  Cardiovascular:     Rate and Rhythm: Tachycardia present. Rhythm irregular.  Abdominal:     Palpations: Abdomen is soft.  Skin:    Coloration: Skin is jaundiced.  Neurological:     Comments: Unable to assess.  Patient unable to follow commands    ED Results and Treatments Labs (all labs ordered are listed, but only abnormal results are displayed) Labs Reviewed  COMPREHENSIVE METABOLIC PANEL - Abnormal; Notable for the following components:      Result Value   Potassium 2.9 (*)    Glucose, Bld 191 (*)    Creatinine, Ser 1.42 (*)    Albumin 2.6 (*)    AST 66 (*)    Alkaline Phosphatase 150 (*)    Total Bilirubin 2.8 (*)    GFR, Estimated 38 (*)    Anion gap 17 (*)    All other components within normal limits  CBC WITH DIFFERENTIAL/PLATELET - Abnormal; Notable for the following components:   WBC 11.1 (*)    Neutro Abs 9.6 (*)    All other components within normal limits  AMMONIA - Abnormal; Notable for the following components:   Ammonia 269 (*)    All other components within normal limits  I-STAT CHEM 8, ED - Abnormal; Notable for the following components:   Potassium 2.8 (*)    BUN 26 (*)    Creatinine, Ser 1.30 (*)    Glucose, Bld 185 (*)    Calcium, Ion 1.12 (*)    All other components within normal limits  I-STAT ARTERIAL BLOOD GAS, ED - Abnormal; Notable for the following components:   pO2, Arterial 472 (*)    Bicarbonate 29.0 (*)    Acid-Base Excess 4.0 (*)    Potassium 2.3 (*)    Calcium, Ion >2.50 (*)     All other components within normal limits  I-STAT CG4 LACTIC ACID, ED - Abnormal; Notable for the following components:   Lactic Acid, Venous 6.1 (*)    All other components within normal limits  CULTURE, BLOOD (ROUTINE X 2)  CULTURE, BLOOD (ROUTINE X 2)  URINALYSIS, ROUTINE W REFLEX MICROSCOPIC  Radiology No results found.  Pertinent labs & imaging results that were available during my care of the patient were reviewed by me and considered in my medical decision making (see MDM for details).  Medications Ordered in ED Medications  fentaNYL 10 mcg/ml infusion (has no administration in time range)  rocuronium (ZEMURON) 100 MG/10ML injection (has no administration in time range)  rocuronium (ZEMURON) 100 MG/10ML injection (has no administration in time range)  cefTRIAXone (ROCEPHIN) 1 g in sodium chloride 0.9 % 100 mL IVPB (1 g Intravenous New Bag/Given 03/10/2023 0919)  diltiazem (CARDIZEM) 1 mg/mL load via infusion 15 mg (15 mg Intravenous Bolus from Bag 04/08/2023 0827)    And  diltiazem (CARDIZEM) 125 mg in dextrose 5% 125 mL (1 mg/mL) infusion (0 mg/hr Intravenous Stopped 03/11/2023 0846)  potassium chloride 10 mEq in 100 mL IVPB (10 mEq Intravenous New Bag/Given 03/12/2023 0936)  dexmedetomidine (PRECEDEX) 400 MCG/100ML (4 mcg/mL) infusion (has no administration in time range)  lactulose (CHRONULAC) 10 GM/15ML solution 30 g (has no administration in time range)  propofol (DIPRIVAN) 1000 MG/100ML infusion (0 mcg/kg/min  Stopped 03/23/2023 0846)  lactated ringers bolus 1,000 mL (1,000 mLs Intravenous New Bag/Given 04/05/2023 0852)  iohexol (OMNIPAQUE) 350 MG/ML injection 75 mL (75 mLs Intravenous Contrast Given 03/21/2023 0917)                                                                                                                                     Procedures .Critical  Care  Performed by: Arletha Pili, DO Authorized by: Arletha Pili, DO   Critical care provider statement:    Critical care time (minutes):  85   Critical care was necessary to treat or prevent imminent or life-threatening deterioration of the following conditions:  Shock, metabolic crisis and respiratory failure   Critical care was time spent personally by me on the following activities:  Development of treatment plan with patient or surrogate, discussions with consultants, evaluation of patient's response to treatment, examination of patient, ordering and review of laboratory studies, ordering and review of radiographic studies, ordering and performing treatments and interventions, pulse oximetry, re-evaluation of patient's condition and review of old charts Procedure Name: Intubation Date/Time: 03/31/2023 9:50 AM  Performed by: Arletha Pili, DOPre-anesthesia Checklist: Patient identified, Patient being monitored, Emergency Drugs available, Timeout performed and Suction available Oxygen Delivery Method: Ambu bag Preoxygenation: Pre-oxygenation with 100% oxygen Induction Type: Rapid sequence Ventilation: Mask ventilation without difficulty Laryngoscope Size: Glidescope and 3 Grade View: Grade I Tube size: 7.5 mm Placement Confirmation: ETT inserted through vocal cords under direct vision, CO2 detector and Breath sounds checked- equal and bilateral Secured at: 20 cm Tube secured with: ETT holder Dental Injury: Teeth and Oropharynx as per pre-operative assessment  Difficulty Due To: Difficult Airway- due to limited oral opening     (including critical care time)  Medical Decision Making / ED Course   This patient  presents to the ED for concern of altered mental status, this involves an extensive number of treatment options, and is a complaint that carries with it a high risk of complications and morbidity.  The differential diagnosis includes hepatic encephalopathy, CVA,  sepsis, metabolic encephalopathy, hypercapnia.  MDM: Upon arrival to the emergency department, my door evaluation as the patient was not protecting her airway.  Immediately got the patient to a room, and intubated the patient.  There was some difficulty with intubation as the line being used likely was not effectively pushing medications.  Patient received succinylcholine, followed by rocuronium without effective paralysis.  A second IV was placed, patient was able to receive etomidate and rocuronium.  Patient then started on a propofol drip.  Patient's rate in the 150s to 170s.  Gave a diltiazem bolus as the patient's blood pressure at that time was systolic 140s.  Unfortunately, patient's blood pressure decreased substantially following this.  Likely related to her reduced EF which looking at her previous echocardiogram is 25 to 30%.  Gave the patient 2 g of IV calcium chloride, 20 mcg of push dose epinephrine with improvement in the patient's pressures.  Will hold off on any additional rate control as we wait for the diltiazem to get out of the patient's system.  Will obtain stat imaging of the patient's head.  My suspicion based on exam is that this likely is hyperammonia.   Reassessment 9:45 AM-patient's ammonia elevated at 269.  Believe this is the cause of her altered mental status.  Have ordered p.o. lactulose.  Will admit patient to ICU.  Reassessment 1:30 PM-patient's family arrived, and son was able to confirm that the patient is a DNR.  He is the healthcare proxy.  The patient's sedating medications, and waited for more family to arrive.  When all the family had arrived, healthcare proxy requested that I extubate the patient, which I did.  Patient was able to have shallow respirations, but was able to maintain normal O2 saturation.  Will admit patient to hospitalist for comfort care.  I did discuss this patient's care with the palliative care nurse practitioner.  Reassessment 1:40  PM-patient's respirations had slowed, I was asked by nurse to come in and reevaluate the patient.  At this time, patient did not have a pulse, no cardiac activity on bedside monitor.  Pupils were nonreactive, no blink reflex.  Time of death called at 1:40 PM.  I called the patient's primary care office, Dr.Veludandi to make them aware of the patient's passing.  Additional history obtained: -Additional history obtained from EMS -External records from outside source obtained and reviewed including: Chart review including previous notes, labs, imaging, consultation notes   Lab Tests: -I ordered, reviewed, and interpreted labs.   The pertinent results include:   Labs Reviewed  COMPREHENSIVE METABOLIC PANEL - Abnormal; Notable for the following components:      Result Value   Potassium 2.9 (*)    Glucose, Bld 191 (*)    Creatinine, Ser 1.42 (*)    Albumin 2.6 (*)    AST 66 (*)    Alkaline Phosphatase 150 (*)    Total Bilirubin 2.8 (*)    GFR, Estimated 38 (*)    Anion gap 17 (*)    All other components within normal limits  CBC WITH DIFFERENTIAL/PLATELET - Abnormal; Notable for the following components:   WBC 11.1 (*)    Neutro Abs 9.6 (*)    All other components within normal limits  AMMONIA -  Abnormal; Notable for the following components:   Ammonia 269 (*)    All other components within normal limits  I-STAT CHEM 8, ED - Abnormal; Notable for the following components:   Potassium 2.8 (*)    BUN 26 (*)    Creatinine, Ser 1.30 (*)    Glucose, Bld 185 (*)    Calcium, Ion 1.12 (*)    All other components within normal limits  I-STAT ARTERIAL BLOOD GAS, ED - Abnormal; Notable for the following components:   pO2, Arterial 472 (*)    Bicarbonate 29.0 (*)    Acid-Base Excess 4.0 (*)    Potassium 2.3 (*)    Calcium, Ion >2.50 (*)    All other components within normal limits  I-STAT CG4 LACTIC ACID, ED - Abnormal; Notable for the following components:   Lactic Acid, Venous 6.1 (*)     All other components within normal limits  CULTURE, BLOOD (ROUTINE X 2)  CULTURE, BLOOD (ROUTINE X 2)  URINALYSIS, ROUTINE W REFLEX MICROSCOPIC      EKG atrial fibrillation with rapid ventricular response  EKG Interpretation Date/Time:    Ventricular Rate:    PR Interval:    QRS Duration:    QT Interval:    QTC Calculation:   R Axis:      Text Interpretation:           Imaging Studies ordered: I ordered imaging studies including CT angiography of the head I independently visualized and interpreted imaging. I agree with the radiologist interpretation   Medicines ordered and prescription drug management: Meds ordered this encounter  Medications   fentaNYL 10 mcg/ml infusion    Blanks, Cindy L: cabinet override   propofol (DIPRIVAN) 1000 MG/100ML infusion    Blanks, Cindy L: cabinet override   rocuronium (ZEMURON) 100 MG/10ML injection    Blanks, Cindy L: cabinet override   rocuronium (ZEMURON) 100 MG/10ML injection    Bevins, Michelle M: cabinet override   cefTRIAXone (ROCEPHIN) 1 g in sodium chloride 0.9 % 100 mL IVPB    Order Specific Question:   Antibiotic Indication:    Answer:   Bacteremia   AND Linked Order Group    diltiazem (CARDIZEM) 1 mg/mL load via infusion 15 mg    diltiazem (CARDIZEM) 125 mg in dextrose 5% 125 mL (1 mg/mL) infusion   lactated ringers bolus 1,000 mL   potassium chloride 10 mEq in 100 mL IVPB   iohexol (OMNIPAQUE) 350 MG/ML injection 75 mL   dexmedetomidine (PRECEDEX) 400 MCG/100ML (4 mcg/mL) infusion   lactulose (CHRONULAC) 10 GM/15ML solution 30 g    -I have reviewed the patients home medicines and have made adjustments as needed  Critical interventions Intubation, rate control, blood pressure control  Consultations Obtained: I requested consultation with the ICU,  and discussed lab and imaging findings as well as pertinent plan - they recommend: Admission   Cardiac Monitoring: The patient was maintained on a cardiac  monitor.  I personally viewed and interpreted the cardiac monitored which showed an underlying rhythm of: Atrial fibrillation  Social Determinants of Health:  Factors impacting patients care include: Multiple chronic medical comorbidities including cirrhosis, metabolic encephalopathy, nursing home resident   Reevaluation: After the interventions noted above, I reevaluated the patient and found that they have :improved  Co morbidities that complicate the patient evaluation  Past Medical History:  Diagnosis Date   Chronic cystitis    Chronic diastolic CHF (congestive heart failure) (HCC)    followed by dr Eden Emms  Chronic kidney disease, stage I    Cirrhosis of liver without ascites (HCC) 03/2016   followed by dr Christella Hartigan (GI);   due to fatty liver;   post op cabg 09/ 2017  , post paracentesis for ascites   Coronary artery disease 01/2016   cardiologist--- dr Estrella Myrtle;   01-27-2016  NSTEMI  s/p cardiac cath;   s/p CABG 01/29/16: LIMA-LAD, SVG-RI, SVG-dRCA   DDD (degenerative disc disease), cervical    per pt w/ chronic neck pain   Dyspnea    occasional   Edema of right lower extremity    Esophageal varices (HCC)    followed by dr Christella Hartigan;   hx multiple banding of large varices   Essential hypertension    no meds   GERD (gastroesophageal reflux disease)    Heart murmur     DX FOR YEARS ASYMPTOMATIC   Hepatic encephalopathy (HCC)    History of adenomatous polyp of colon    History of COVID-19 04/2020   per pt mild  symptoms that resolve   History of kidney stones    passed stones   History of non-ST elevation myocardial infarction (NSTEMI) 01/27/2016   Hyperlipidemia    diet controlled   NAFLD (nonalcoholic fatty liver disease)    Neuropathy, peripheral 04/01/2015   OA (osteoarthritis)    Paroxysmal A-fib (HCC)    08/19/19 afib with RVR, spontaneously converted to SR   Portal vein thrombosis    per dr Christella Hartigan  ,  chronic non-occluded   Thrombocytopenia (HCC)    followed by pcp   and hematology-- dr Mosetta Putt;   likely secondary to liver cirrhosis   Type 2 diabetes mellitus treated with insulin (HCC)    Type 2, followed by pcp    (07-21-2021  per pt checks blood sugar dialy in am fasting average 113--200s)   Urge incontinence of urine    Wears hearing aid in both ears       Dispostion: Expired     Final Clinical Impression(s) / ED Diagnoses Final diagnoses:  Hepatic encephalopathy (HCC)     @PCDICTATION @    Anders Simmonds T, DO 04/01/2023 0951    Arletha Pili, DO 03/22/2023 1330    Anders Simmonds T, DO 04/07/2023 1349

## 2023-04-09 NOTE — ED Notes (Signed)
100mg Roc given

## 2023-04-09 NOTE — ED Notes (Signed)
BP 55/41  Propofol and Cardizem stopped.    2g Calcium given IV push.  20 mcg Epi given IV push.

## 2023-04-09 NOTE — ED Notes (Signed)
Patient extubated.   Family members at bedside.

## 2023-04-09 NOTE — ED Notes (Signed)
MD at bedside to intubate patient.  

## 2023-04-09 NOTE — Consult Note (Signed)
Consultation Note Date: 03/23/2023   Patient Name: [name]  DOB: 08/09/1945  MRN: [id number]  Age / Sex: 77 y.o., female  PCP: Venita Sheffield, MD Referring Physician: Arletha Pili, DO  Reason for Consultation:  end of life care   HPI/Patient Profile: 77 y.o. female  with past medical history of NASH cirrhosis, portal hypertensive gastropathy s/p TIPS, thrombocytopenia, chronic respiratory failure, diastolic heart failure, HTN, CAD s/p CABG, recurrent ESBL UTI, a. Fib with RVR, admitted on 03/15/2023 from SNF with altered mental status. She was found to be respiratory failure and was intubated. Labs indicate significant hepatic insufficiency and hyperammoniemia at 269. Palliative consulted for end of life care.   Primary Decision Maker HCPOA Amalia Hailey and Thea Silversmith per [facility] document  Discussion: Chart reviewed including labs, progress notes, imaging from this and previous encounters.  Discussed with attending MD.  Patient's HCPOA arrived after intubation with patient's DNR and advanced directives.  Plan has been made for extubation to comfort.  I met with family at bedside. We discussed extubation process and comfort measures. Confirmed DNR.  Patient and family are very spiritual- she is [place]. They did not have any spiritual or cultural needs.  More family members are coming to visit prior to extubation.  Family is prepared for patient's end of life, they are assured by their faith.   SUMMARY OF RECOMMENDATIONS -DNR- comfort measures only -Extubate to comfort per ED attending when family is ready -Patient has had sedating medications turned off- recommend having morphine 4mg  IV push and lorazepam 2mg  IV push on hand in case patient has air hunger with extubation -I anticipate hospital death -PMT will continue to follow    Code Status/Advance Care  Planning: DNR   Prognosis:   Hours - Days  Discharge Planning: Anticipated Hospital Death  Primary Diagnoses: Present on Admission: **None**   Review of Systems  Unable to perform ROS   Physical Exam Vitals and nursing note reviewed.  Constitutional:      Appearance: She is ill-appearing.  Pulmonary:     Comments: intubated Skin:    Coloration: Skin is jaundiced.  Neurological:     Comments: Unresponsive, no spontaneous movement     Vital Signs: BP (!) 140/104   Pulse (!) 134   Temp 98.1 F (36.7 C) (Axillary)   Resp 19   Ht 5\' 2"  (1.575 m)   Wt 63.5 kg Comment: estimated weight  LMP  (LMP Unknown)   SpO2 100%   BMI 25.61 kg/m          SpO2: SpO2: 100 % O2 Device:SpO2: 100 % O2 Flow Rate: .   IO: Intake/output summary: No intake or output data in the 24 hours ending 04/06/2023 1145  LBM:   Baseline Weight: Weight: 63.5 kg (estimated weight) Most recent weight: Weight: 63.5 kg (estimated weight)       Thank you for this consult. Palliative medicine will continue to follow and assist as needed.  Time Total: 75 minutes Signed by: Ocie Bob,  AGNP-C Palliative Medicine  Time includes:   Preparing to see the patient (e.g., review of tests) Obtaining and/or reviewing separately obtained history Performing a medically necessary appropriate examination and/or evaluation Counseling and educating the patient/family/caregiver Ordering medications, tests, or procedures Referring and communicating with other health care professionals (when not reported separately) Documenting clinical information in the electronic or other health record Independently interpreting results (not reported separately) and communicating results to the patient/family/caregiver Care coordination (not reported separately) Clinical documentation   Please contact Palliative Medicine Team phone at [phone] for questions and concerns.  For individual provider: See  Loretha Stapler

## 2023-04-09 NOTE — Progress Notes (Signed)
Pt extubated to comfort care.  

## 2023-04-09 NOTE — Progress Notes (Signed)
Pt transported to CT and back on vent w/o any complications.

## 2023-04-09 NOTE — ED Notes (Signed)
Time of death called at 1340.

## 2023-04-09 NOTE — ED Notes (Signed)
MD turned off monitor.   CCMD made aware.

## 2023-04-09 DEATH — deceased
